# Patient Record
Sex: Male | Born: 1950
Health system: Southern US, Community
[De-identification: ages and names within clinical notes are randomized; demographics above are authoritative.]

## PROBLEM LIST (undated history)

## (undated) DIAGNOSIS — I214 Non-ST elevation (NSTEMI) myocardial infarction: Secondary | ICD-10-CM

## (undated) DIAGNOSIS — M7712 Lateral epicondylitis, left elbow: Secondary | ICD-10-CM

## (undated) DIAGNOSIS — I2510 Atherosclerotic heart disease of native coronary artery without angina pectoris: Secondary | ICD-10-CM

## (undated) DIAGNOSIS — F419 Anxiety disorder, unspecified: Principal | ICD-10-CM

## (undated) DIAGNOSIS — E119 Type 2 diabetes mellitus without complications: Secondary | ICD-10-CM

## (undated) DIAGNOSIS — K219 Gastro-esophageal reflux disease without esophagitis: Secondary | ICD-10-CM

## (undated) DIAGNOSIS — Z720 Tobacco use: Secondary | ICD-10-CM

## (undated) DIAGNOSIS — E785 Hyperlipidemia, unspecified: Secondary | ICD-10-CM

## (undated) DIAGNOSIS — D696 Thrombocytopenia, unspecified: Secondary | ICD-10-CM

## (undated) DIAGNOSIS — G4700 Insomnia, unspecified: Secondary | ICD-10-CM

## (undated) DIAGNOSIS — I739 Peripheral vascular disease, unspecified: Secondary | ICD-10-CM

## (undated) DIAGNOSIS — N390 Urinary tract infection, site not specified: Secondary | ICD-10-CM

## (undated) DIAGNOSIS — M659 Synovitis and tenosynovitis, unspecified: Secondary | ICD-10-CM

## (undated) DIAGNOSIS — S3992XA Unspecified injury of lower back, initial encounter: Secondary | ICD-10-CM

## (undated) DIAGNOSIS — C9110 Chronic lymphocytic leukemia of B-cell type not having achieved remission: Principal | ICD-10-CM

## (undated) DIAGNOSIS — J449 Chronic obstructive pulmonary disease, unspecified: Secondary | ICD-10-CM

## (undated) DIAGNOSIS — M65939 Unspecified synovitis and tenosynovitis, unspecified forearm: Secondary | ICD-10-CM

## (undated) DIAGNOSIS — Z79899 Other long term (current) drug therapy: Secondary | ICD-10-CM

## (undated) DIAGNOSIS — G709 Myoneural disorder, unspecified: Secondary | ICD-10-CM

## (undated) HISTORY — DX: Myoneural disorder, unspecified: G70.9

## (undated) HISTORY — DX: Chronic obstructive pulmonary disease, unspecified: J44.9

## (undated) HISTORY — DX: Chronic lymphocytic leukemia of B-cell type not having achieved remission: C91.10

## (undated) HISTORY — PX: CARDIAC CATHETERIZATION: SHX172

## (undated) HISTORY — DX: Non-ST elevation (NSTEMI) myocardial infarction: I21.4

## (undated) HISTORY — DX: Unspecified injury of lower back, initial encounter: S39.92XA

## (undated) HISTORY — PX: OTHER SURGICAL HISTORY: SHX169

## (undated) HISTORY — DX: Type 2 diabetes mellitus without complications: E11.9

## (undated) HISTORY — DX: Atherosclerotic heart disease of native coronary artery without angina pectoris: I25.10

## (undated) HISTORY — DX: Tobacco use: Z72.0

## (undated) HISTORY — DX: Anxiety disorder, unspecified: F41.9

## (undated) HISTORY — DX: Peripheral vascular disease, unspecified: I73.9

## (undated) HISTORY — DX: Hyperlipidemia, unspecified: E78.5

---
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
## 1989-01-14 HISTORY — PX: OTHER SURGICAL HISTORY: SHX169

## 1989-04-14 HISTORY — PX: OTHER SURGICAL HISTORY: SHX169

## 1997-07-24 ENCOUNTER — Ambulatory Visit (HOSPITAL_COMMUNITY): Admission: RE | Admit: 1997-07-24 | Discharge: 1997-07-24 | Payer: Self-pay | Admitting: Cardiology

## 2003-05-15 ENCOUNTER — Inpatient Hospital Stay (HOSPITAL_COMMUNITY): Admission: EM | Admit: 2003-05-15 | Discharge: 2003-05-16 | Payer: Self-pay | Admitting: Emergency Medicine

## 2005-01-24 ENCOUNTER — Encounter (INDEPENDENT_AMBULATORY_CARE_PROVIDER_SITE_OTHER): Payer: Self-pay | Admitting: *Deleted

## 2005-01-24 ENCOUNTER — Ambulatory Visit: Admission: RE | Admit: 2005-01-24 | Discharge: 2005-01-24 | Payer: Self-pay | Admitting: General Surgery

## 2005-02-14 HISTORY — PX: CHOLECYSTECTOMY: SHX55

## 2005-05-10 ENCOUNTER — Ambulatory Visit (HOSPITAL_COMMUNITY): Admission: RE | Admit: 2005-05-10 | Discharge: 2005-05-10 | Payer: Self-pay | Admitting: Gastroenterology

## 2006-06-05 ENCOUNTER — Ambulatory Visit (HOSPITAL_COMMUNITY): Admission: RE | Admit: 2006-06-05 | Discharge: 2006-06-05 | Payer: Self-pay | Admitting: Cardiology

## 2006-06-05 ENCOUNTER — Encounter: Payer: Self-pay | Admitting: Vascular Surgery

## 2006-06-05 ENCOUNTER — Ambulatory Visit: Payer: Self-pay | Admitting: Vascular Surgery

## 2006-07-13 ENCOUNTER — Ambulatory Visit: Payer: Self-pay | Admitting: *Deleted

## 2006-08-16 ENCOUNTER — Inpatient Hospital Stay (HOSPITAL_COMMUNITY): Admission: RE | Admit: 2006-08-16 | Discharge: 2006-08-18 | Payer: Self-pay | Admitting: *Deleted

## 2006-08-16 ENCOUNTER — Ambulatory Visit: Payer: Self-pay | Admitting: *Deleted

## 2006-08-16 ENCOUNTER — Encounter (INDEPENDENT_AMBULATORY_CARE_PROVIDER_SITE_OTHER): Payer: Self-pay | Admitting: *Deleted

## 2006-08-17 ENCOUNTER — Encounter (INDEPENDENT_AMBULATORY_CARE_PROVIDER_SITE_OTHER): Payer: Self-pay | Admitting: *Deleted

## 2006-09-07 ENCOUNTER — Ambulatory Visit: Payer: Self-pay | Admitting: *Deleted

## 2006-09-28 ENCOUNTER — Ambulatory Visit: Payer: Self-pay | Admitting: *Deleted

## 2007-08-15 HISTORY — PX: TARSAL TUNNEL RELEASE: SUR1099

## 2007-09-13 ENCOUNTER — Ambulatory Visit: Payer: Self-pay | Admitting: *Deleted

## 2008-07-24 ENCOUNTER — Ambulatory Visit: Payer: Self-pay | Admitting: Surgery

## 2008-08-06 ENCOUNTER — Ambulatory Visit (HOSPITAL_COMMUNITY): Admission: RE | Admit: 2008-08-06 | Discharge: 2008-08-06 | Payer: Self-pay | Admitting: *Deleted

## 2008-08-06 ENCOUNTER — Ambulatory Visit: Payer: Self-pay | Admitting: *Deleted

## 2008-09-04 ENCOUNTER — Ambulatory Visit: Payer: Self-pay | Admitting: *Deleted

## 2008-09-27 ENCOUNTER — Ambulatory Visit: Payer: Self-pay | Admitting: Occupational Medicine

## 2008-09-27 DIAGNOSIS — K219 Gastro-esophageal reflux disease without esophagitis: Secondary | ICD-10-CM

## 2008-09-27 DIAGNOSIS — E785 Hyperlipidemia, unspecified: Secondary | ICD-10-CM

## 2008-09-27 DIAGNOSIS — I739 Peripheral vascular disease, unspecified: Secondary | ICD-10-CM

## 2008-09-27 DIAGNOSIS — IMO0002 Reserved for concepts with insufficient information to code with codable children: Secondary | ICD-10-CM | POA: Insufficient documentation

## 2009-02-11 ENCOUNTER — Ambulatory Visit: Payer: Self-pay | Admitting: Vascular Surgery

## 2009-06-14 HISTORY — PX: CORONARY STENT PLACEMENT: SHX1402

## 2009-06-28 ENCOUNTER — Inpatient Hospital Stay (HOSPITAL_COMMUNITY): Admission: EM | Admit: 2009-06-28 | Discharge: 2009-06-29 | Payer: Self-pay | Admitting: Cardiology

## 2009-06-28 ENCOUNTER — Encounter: Payer: Self-pay | Admitting: Emergency Medicine

## 2009-06-28 ENCOUNTER — Ambulatory Visit: Payer: Self-pay | Admitting: Internal Medicine

## 2009-06-28 ENCOUNTER — Ambulatory Visit: Payer: Self-pay | Admitting: Diagnostic Radiology

## 2009-06-28 DIAGNOSIS — I214 Non-ST elevation (NSTEMI) myocardial infarction: Secondary | ICD-10-CM

## 2009-06-28 HISTORY — DX: Non-ST elevation (NSTEMI) myocardial infarction: I21.4

## 2009-08-31 ENCOUNTER — Ambulatory Visit: Payer: Self-pay | Admitting: Surgery

## 2010-04-05 ENCOUNTER — Other Ambulatory Visit: Payer: Self-pay | Admitting: Oncology

## 2010-04-05 ENCOUNTER — Encounter (HOSPITAL_BASED_OUTPATIENT_CLINIC_OR_DEPARTMENT_OTHER): Payer: BC Managed Care – PPO | Admitting: Oncology

## 2010-04-05 ENCOUNTER — Other Ambulatory Visit (HOSPITAL_COMMUNITY)
Admission: RE | Admit: 2010-04-05 | Discharge: 2010-04-05 | Disposition: A | Discharge status: Home / Self Care | Payer: BC Managed Care – PPO | Source: Ambulatory Visit | Attending: Oncology | Admitting: Oncology

## 2010-04-05 DIAGNOSIS — D47Z9 Other specified neoplasms of uncertain behavior of lymphoid, hematopoietic and related tissue: Secondary | ICD-10-CM

## 2010-04-05 DIAGNOSIS — D72820 Lymphocytosis (symptomatic): Secondary | ICD-10-CM | POA: Insufficient documentation

## 2010-04-05 LAB — COMPREHENSIVE METABOLIC PANEL WITH GFR
ALT: 28 U/L (ref 0–53)
AST: 29 U/L (ref 0–37)
Albumin: 3.9 g/dL (ref 3.5–5.2)
Alkaline Phosphatase: 96 U/L (ref 39–117)
BUN: 12 mg/dL (ref 6–23)
CO2: 23 meq/L (ref 19–32)
Calcium: 9.1 mg/dL (ref 8.4–10.5)
Chloride: 105 meq/L (ref 96–112)
Creatinine, Ser: 0.9 mg/dL (ref 0.40–1.50)
Glucose, Bld: 309 mg/dL — ABNORMAL HIGH (ref 70–99)
Potassium: 4 meq/L (ref 3.5–5.3)
Sodium: 138 meq/L (ref 135–145)
Total Bilirubin: 0.5 mg/dL (ref 0.3–1.2)
Total Protein: 6.1 g/dL (ref 6.0–8.3)

## 2010-04-05 LAB — CBC WITH DIFFERENTIAL/PLATELET
BASO%: 0.4 % (ref 0.0–2.0)
Basophils Absolute: 0.1 10*3/uL (ref 0.0–0.1)
EOS%: 0.2 % (ref 0.0–7.0)
Eosinophils Absolute: 0.1 10*3/uL (ref 0.0–0.5)
HCT: 46.5 % (ref 38.4–49.9)
HGB: 16.2 g/dL (ref 13.0–17.1)
LYMPH%: 72.9 % — ABNORMAL HIGH (ref 14.0–49.0)
MCH: 31.5 pg (ref 27.2–33.4)
MCHC: 34.9 g/dL (ref 32.0–36.0)
MONO#: 0.6 10*3/uL (ref 0.1–0.9)
NEUT#: 7.4 10*3/uL — ABNORMAL HIGH (ref 1.5–6.5)
NEUT%: 24.6 % — ABNORMAL LOW (ref 39.0–75.0)
RBC: 5.16 10*6/uL (ref 4.20–5.82)
RDW: 13.9 % (ref 11.0–14.6)
WBC: 30.2 10*3/uL — ABNORMAL HIGH (ref 4.0–10.3)
lymph#: 22 10*3/uL — ABNORMAL HIGH (ref 0.9–3.3)

## 2010-04-05 LAB — MORPHOLOGY
PLT EST: ADEQUATE
RBC Comments: NORMAL

## 2010-04-05 LAB — LACTATE DEHYDROGENASE: LDH: 187 U/L (ref 94–250)

## 2010-04-06 LAB — IGG, IGA, IGM
IgA: 143 mg/dL (ref 68–378)
IgG (Immunoglobin G), Serum: 515 mg/dL — ABNORMAL LOW (ref 694–1618)
IgM, Serum: 28 mg/dL — ABNORMAL LOW (ref 60–263)

## 2010-04-06 LAB — DIRECT ANTIGLOBULIN TEST (NOT AT ARMC)
DAT (Complement): NEGATIVE
DAT IgG: NEGATIVE

## 2010-04-06 LAB — HAPTOGLOBIN: Haptoglobin: 228 mg/dL — ABNORMAL HIGH (ref 16–200)

## 2010-04-07 LAB — LEUKOCYTE ALKALINE PHOSPHATASE: Leukocyte Alkaline Phos Stain: 168 — ABNORMAL HIGH (ref 30–140)

## 2010-04-07 LAB — JAK-2 V617F

## 2010-04-07 LAB — FLOW CYTOMETRY

## 2010-04-08 LAB — COMPREHENSIVE METABOLIC PANEL
ALT: 28 U/L (ref 0–53)
AST: 29 U/L (ref 0–37)
Alkaline Phosphatase: 96 U/L (ref 39–117)
BUN: 12 mg/dL (ref 6–23)
CO2: 23 mEq/L (ref 19–32)
Calcium: 9.1 mg/dL (ref 8.4–10.5)
Chloride: 105 mEq/L (ref 96–112)
Creatinine, Ser: 0.9 mg/dL (ref 0.40–1.50)
Glucose, Bld: 309 mg/dL — ABNORMAL HIGH (ref 70–99)
Sodium: 138 mEq/L (ref 135–145)
Total Bilirubin: 0.5 mg/dL (ref 0.3–1.2)
Total Protein: 6.1 g/dL (ref 6.0–8.3)

## 2010-04-08 LAB — LACTATE DEHYDROGENASE: LDH: 187 U/L (ref 94–250)

## 2010-04-12 ENCOUNTER — Encounter: Payer: Self-pay | Admitting: Oncology

## 2010-04-20 ENCOUNTER — Other Ambulatory Visit: Payer: Self-pay | Admitting: Oncology

## 2010-04-20 ENCOUNTER — Encounter (HOSPITAL_BASED_OUTPATIENT_CLINIC_OR_DEPARTMENT_OTHER): Payer: BC Managed Care – PPO | Admitting: Oncology

## 2010-04-20 DIAGNOSIS — D47Z9 Other specified neoplasms of uncertain behavior of lymphoid, hematopoietic and related tissue: Secondary | ICD-10-CM

## 2010-04-20 LAB — CBC WITH DIFFERENTIAL/PLATELET
BASO%: 0.9 % (ref 0.0–2.0)
Basophils Absolute: 0.3 10*3/uL — ABNORMAL HIGH (ref 0.0–0.1)
EOS%: 0.2 % (ref 0.0–7.0)
Eosinophils Absolute: 0.1 10*3/uL (ref 0.0–0.5)
HCT: 45.6 % (ref 38.4–49.9)
HGB: 15.7 g/dL (ref 13.0–17.1)
LYMPH%: 72.5 % — ABNORMAL HIGH (ref 14.0–49.0)
MCHC: 34.3 g/dL (ref 32.0–36.0)
MCV: 90.9 fL (ref 79.3–98.0)
MONO#: 0.8 10*3/uL (ref 0.1–0.9)
MONO%: 2.9 % (ref 0.0–14.0)
NEUT#: 6.8 10*3/uL — ABNORMAL HIGH (ref 1.5–6.5)
NEUT%: 23.5 % — ABNORMAL LOW (ref 39.0–75.0)
Platelets: 148 10*3/uL (ref 140–400)
RBC: 5.02 10*6/uL (ref 4.20–5.82)
RDW: 13.9 % (ref 11.0–14.6)
WBC: 29 10*3/uL — ABNORMAL HIGH (ref 4.0–10.3)
lymph#: 21.1 10*3/uL — ABNORMAL HIGH (ref 0.9–3.3)

## 2010-04-21 ENCOUNTER — Other Ambulatory Visit: Payer: Self-pay | Admitting: Oncology

## 2010-04-21 ENCOUNTER — Encounter (HOSPITAL_BASED_OUTPATIENT_CLINIC_OR_DEPARTMENT_OTHER): Payer: BC Managed Care – PPO | Admitting: Oncology

## 2010-04-21 DIAGNOSIS — D47Z9 Other specified neoplasms of uncertain behavior of lymphoid, hematopoietic and related tissue: Secondary | ICD-10-CM

## 2010-04-21 DIAGNOSIS — C9110 Chronic lymphocytic leukemia of B-cell type not having achieved remission: Secondary | ICD-10-CM

## 2010-04-21 LAB — BASIC METABOLIC PANEL
BUN: 11 mg/dL (ref 6–23)
CO2: 27 mEq/L (ref 19–32)
Calcium: 9.8 mg/dL (ref 8.4–10.5)
Chloride: 101 mEq/L (ref 96–112)
Creatinine, Ser: 0.9 mg/dL (ref 0.40–1.50)
Glucose, Bld: 306 mg/dL — ABNORMAL HIGH (ref 70–99)
Potassium: 4.4 mEq/L (ref 3.5–5.3)

## 2010-05-03 LAB — GLUCOSE, CAPILLARY: Glucose-Capillary: 120 mg/dL — ABNORMAL HIGH (ref 70–99)

## 2010-05-03 LAB — CBC
HCT: 48.1 % (ref 39.0–52.0)
HCT: 53.3 % — ABNORMAL HIGH (ref 39.0–52.0)
Hemoglobin: 16.7 g/dL (ref 13.0–17.0)
Hemoglobin: 17.1 g/dL — ABNORMAL HIGH (ref 13.0–17.0)
Hemoglobin: 18.2 g/dL — ABNORMAL HIGH (ref 13.0–17.0)
MCHC: 34.7 g/dL (ref 30.0–36.0)
MCHC: 35.1 g/dL (ref 30.0–36.0)
MCHC: 34.1 g/dL (ref 30.0–36.0)
MCV: 96.5 fL (ref 78.0–100.0)
MCV: 97.4 fL (ref 78.0–100.0)
MCV: 96.3 fL (ref 78.0–100.0)
Platelets: 148 10*3/uL — ABNORMAL LOW (ref 150–400)
Platelets: 170 K/uL (ref 150–400)
RBC: 4.94 MIL/uL (ref 4.22–5.81)
RBC: 5.54 MIL/uL (ref 4.22–5.81)
RDW: 15 % (ref 11.5–15.5)
RDW: 15.2 % (ref 11.5–15.5)
RDW: 14.3 % (ref 11.5–15.5)
WBC: 13.2 10*3/uL — ABNORMAL HIGH (ref 4.0–10.5)
WBC: 14.3 K/uL — ABNORMAL HIGH (ref 4.0–10.5)

## 2010-05-03 LAB — DIFFERENTIAL
Basophils Absolute: 0 10*3/uL (ref 0.0–0.1)
Basophils Absolute: 0 K/uL (ref 0.0–0.1)
Basophils Relative: 0 % (ref 0–1)
Basophils Relative: 0 % (ref 0–1)
Basophils Relative: 1 % (ref 0–1)
Eosinophils Absolute: 0.1 10*3/uL (ref 0.0–0.7)
Eosinophils Absolute: 0.2 K/uL (ref 0.0–0.7)
Eosinophils Absolute: 0.1 10*3/uL (ref 0.0–0.7)
Eosinophils Relative: 1 % (ref 0–5)
Eosinophils Relative: 2 % (ref 0–5)
Eosinophils Relative: 1 % (ref 0–5)
Lymphocytes Relative: 40 % (ref 12–46)
Lymphocytes Relative: 58 % — ABNORMAL HIGH (ref 12–46)
Lymphs Abs: 5.5 K/uL — ABNORMAL HIGH (ref 0.7–4.0)
Lymphs Abs: 7.6 10*3/uL — ABNORMAL HIGH (ref 0.7–4.0)
Lymphs Abs: 8.3 10*3/uL — ABNORMAL HIGH (ref 0.7–4.0)
Monocytes Absolute: 0.4 K/uL (ref 0.1–1.0)
Monocytes Absolute: 0.7 10*3/uL (ref 0.1–1.0)
Monocytes Absolute: 0.4 10*3/uL (ref 0.1–1.0)
Monocytes Relative: 3 % (ref 3–12)
Monocytes Relative: 5 % (ref 3–12)
Neutro Abs: 4.8 10*3/uL (ref 1.7–7.7)
Neutro Abs: 7.5 K/uL (ref 1.7–7.7)
Neutrophils Relative %: 36 % — ABNORMAL LOW (ref 43–77)
Neutrophils Relative %: 55 % (ref 43–77)
Neutrophils Relative %: 38 % — ABNORMAL LOW (ref 43–77)

## 2010-05-03 LAB — APTT: aPTT: 30 seconds (ref 24–37)

## 2010-05-03 LAB — COMPREHENSIVE METABOLIC PANEL
ALT: 23 U/L (ref 0–53)
ALT: 24 U/L (ref 0–53)
AST: 24 U/L (ref 0–37)
AST: 21 U/L (ref 0–37)
Albumin: 3.2 g/dL — ABNORMAL LOW (ref 3.5–5.2)
Alkaline Phosphatase: 70 U/L (ref 39–117)
Alkaline Phosphatase: 112 U/L (ref 39–117)
BUN: 7 mg/dL (ref 6–23)
CO2: 23 mEq/L (ref 19–32)
CO2: 21 mEq/L (ref 19–32)
Calcium: 8.4 mg/dL (ref 8.4–10.5)
Calcium: 9.4 mg/dL (ref 8.4–10.5)
Chloride: 108 mEq/L (ref 96–112)
Creatinine, Ser: 0.85 mg/dL (ref 0.4–1.5)
GFR calc Af Amer: >60 mL/min (ref >60)
GFR calc Af Amer: >60 mL/min (ref >60)
GFR calc non Af Amer: >60 mL/min (ref >60)
GFR calc non Af Amer: >60 mL/min (ref >60)
Glucose, Bld: 123 mg/dL — ABNORMAL HIGH (ref 70–99)
Glucose, Bld: 228 mg/dL — ABNORMAL HIGH (ref 70–99)
Potassium: 4.2 mEq/L (ref 3.5–5.1)
Potassium: 4 mEq/L (ref 3.5–5.1)
Sodium: 138 mEq/L (ref 135–145)
Sodium: 145 mEq/L (ref 135–145)
Total Bilirubin: 0.6 mg/dL (ref 0.3–1.2)
Total Protein: 5.6 g/dL — ABNORMAL LOW (ref 6.0–8.3)
Total Protein: 7.2 g/dL (ref 6.0–8.3)

## 2010-05-03 LAB — CARDIAC PANEL(CRET KIN+CKTOT+MB+TROPI)
CK, MB: 2.5 ng/mL (ref 0.3–4.0)
CK, MB: 6.9 ng/mL (ref 0.3–4.0)
Relative Index: INVALID (ref 0.0–2.5)
Relative Index: INVALID (ref 0.0–2.5)
Total CK: 48 U/L (ref 7–232)
Total CK: 71 U/L (ref 7–232)
Total CK: 94 U/L (ref 7–232)
Troponin I: 0.7 ng/mL (ref 0.00–0.06)

## 2010-05-03 LAB — BASIC METABOLIC PANEL WITH GFR
BUN: 8 mg/dL (ref 6–23)
CO2: 23 meq/L (ref 19–32)
Calcium: 8.5 mg/dL (ref 8.4–10.5)
Chloride: 110 meq/L (ref 96–112)
Creatinine, Ser: 0.84 mg/dL (ref 0.4–1.5)
GFR calc non Af Amer: >60 mL/min
Glucose, Bld: 113 mg/dL — ABNORMAL HIGH (ref 70–99)
Potassium: 4.2 meq/L (ref 3.5–5.1)
Sodium: 140 meq/L (ref 135–145)

## 2010-05-03 LAB — PROTIME-INR
INR: 1.02 (ref 0.00–1.49)
INR: 0.92 (ref 0.00–1.49)
Prothrombin Time: 13.3 s (ref 11.6–15.2)
Prothrombin Time: 12.3 s (ref 11.6–15.2)

## 2010-05-03 LAB — POCT CARDIAC MARKERS
CKMB, poc: 1.2 ng/mL (ref 1.0–8.0)
Troponin i, poc: <0.05 ng/mL (ref 0.00–0.09)

## 2010-05-03 LAB — LIPASE, BLOOD: Lipase: 107 U/L (ref 23–300)

## 2010-05-03 LAB — LIPID PANEL
HDL: 32 mg/dL — ABNORMAL LOW
Total CHOL/HDL Ratio: 6.3 ratio
Triglycerides: 217 mg/dL — ABNORMAL HIGH
VLDL: 43 mg/dL — ABNORMAL HIGH (ref 0–40)

## 2010-05-03 LAB — PATHOLOGIST SMEAR REVIEW

## 2010-05-03 LAB — HEPARIN LEVEL (UNFRACTIONATED)

## 2010-05-03 LAB — BRAIN NATRIURETIC PEPTIDE: Pro B Natriuretic peptide (BNP): 59 pg/mL (ref 0.0–100.0)

## 2010-05-03 LAB — MAGNESIUM: Magnesium: 2 mg/dL (ref 1.5–2.5)

## 2010-05-03 LAB — HEMOGLOBIN A1C
Hgb A1c MFr Bld: 6.3 % — ABNORMAL HIGH (ref <5.7)
Mean Plasma Glucose: 134 mg/dL — ABNORMAL HIGH (ref <117)

## 2010-05-03 LAB — MRSA PCR SCREENING: MRSA by PCR: NEGATIVE

## 2010-05-06 ENCOUNTER — Encounter (HOSPITAL_COMMUNITY): Payer: Self-pay

## 2010-05-06 ENCOUNTER — Ambulatory Visit (HOSPITAL_COMMUNITY)
Admission: RE | Admit: 2010-05-06 | Discharge: 2010-05-06 | Disposition: A | Discharge status: Home / Self Care | Payer: BC Managed Care – PPO | Source: Ambulatory Visit | Attending: Oncology | Admitting: Oncology

## 2010-05-06 DIAGNOSIS — J984 Other disorders of lung: Secondary | ICD-10-CM | POA: Insufficient documentation

## 2010-05-06 DIAGNOSIS — C9110 Chronic lymphocytic leukemia of B-cell type not having achieved remission: Secondary | ICD-10-CM | POA: Insufficient documentation

## 2010-05-06 DIAGNOSIS — Z9089 Acquired absence of other organs: Secondary | ICD-10-CM | POA: Insufficient documentation

## 2010-05-06 MED ORDER — IOHEXOL 300 MG/ML  SOLN
125.0000 mL | Freq: Once | INTRAMUSCULAR | Status: AC | PRN
  Administered 2010-05-06: 125 mL via INTRAVENOUS

## 2010-05-24 LAB — POCT I-STAT, CHEM 8
Calcium, Ion: 1.13 mmol/L (ref 1.12–1.32)
HCT: 52 % (ref 39.0–52.0)
Hemoglobin: 17.7 g/dL — ABNORMAL HIGH (ref 13.0–17.0)
Sodium: 139 mEq/L (ref 135–145)
TCO2: 24 mmol/L (ref 0–100)

## 2010-06-14 ENCOUNTER — Encounter: Payer: Self-pay | Admitting: Cardiology

## 2010-06-14 DIAGNOSIS — Z720 Tobacco use: Secondary | ICD-10-CM | POA: Insufficient documentation

## 2010-06-14 DIAGNOSIS — I739 Peripheral vascular disease, unspecified: Secondary | ICD-10-CM | POA: Insufficient documentation

## 2010-06-14 DIAGNOSIS — E785 Hyperlipidemia, unspecified: Secondary | ICD-10-CM | POA: Insufficient documentation

## 2010-06-14 DIAGNOSIS — I214 Non-ST elevation (NSTEMI) myocardial infarction: Secondary | ICD-10-CM | POA: Insufficient documentation

## 2010-06-21 ENCOUNTER — Encounter: Payer: Self-pay | Admitting: Cardiology

## 2010-06-21 ENCOUNTER — Ambulatory Visit (INDEPENDENT_AMBULATORY_CARE_PROVIDER_SITE_OTHER): Payer: BC Managed Care – PPO | Admitting: Cardiology

## 2010-06-21 VITALS — BP 142/90 | HR 74 | Ht 72.0 in | Wt 234.0 lb

## 2010-06-21 DIAGNOSIS — I2510 Atherosclerotic heart disease of native coronary artery without angina pectoris: Secondary | ICD-10-CM

## 2010-06-21 DIAGNOSIS — I219 Acute myocardial infarction, unspecified: Secondary | ICD-10-CM

## 2010-06-21 DIAGNOSIS — Z720 Tobacco use: Secondary | ICD-10-CM

## 2010-06-21 DIAGNOSIS — I214 Non-ST elevation (NSTEMI) myocardial infarction: Secondary | ICD-10-CM

## 2010-06-21 DIAGNOSIS — E785 Hyperlipidemia, unspecified: Secondary | ICD-10-CM

## 2010-06-21 MED ORDER — METOPROLOL SUCCINATE ER 50 MG PO TB24: 50.0000 mg | ORAL_TABLET | Freq: Every day | ORAL | Status: DC

## 2010-06-21 MED ORDER — PRASUGREL HCL 10 MG PO TABS: 10.0000 mg | ORAL_TABLET | Freq: Every day | ORAL | Status: DC

## 2010-06-21 MED ORDER — ROSUVASTATIN CALCIUM 10 MG PO TABS: 10.0000 mg | ORAL_TABLET | Freq: Every day | ORAL | Status: DC

## 2010-06-21 MED ORDER — NITROGLYCERIN 0.4 MG SL SUBL: 0.4000 mg | SUBLINGUAL_TABLET | SUBLINGUAL | Status: DC | PRN

## 2010-06-21 NOTE — Assessment & Plan Note (Signed)
He's currently smoking a fourth pack cigarettes a day. I encouraged him to stop.

## 2010-06-21 NOTE — Assessment & Plan Note (Signed)
We'll continue his duall antiplatelet therapy with his long stent and ongoing cigarette abuse.

## 2010-06-21 NOTE — Progress Notes (Signed)
Subjective:   Ryan Copeland comes in today for followup visit. In general, he's been doing well he he had a 3.0 x 32 mm stent in his left anterior descending in may 2011. He continues to smoke and I would like to continue him on chronic Effient as long as he continues to smoke. Overall, he's been doing well but still has some problems with the neuropathy. 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 He's had mild increases in blood pressure chronically. Overall, he's tolerating his medications well. He light to stop smoking and given my approval to use a Nicoderm patch.  Current Outpatient Prescriptions  Medication Sig Dispense Refill  . aspirin 325 MG tablet Take 325 mg by mouth daily.        Marland Kitchen esomeprazole (NEXIUM) 40 MG capsule Take 40 mg by mouth daily before breakfast.        . nitroGLYCERIN (NITROSTAT) 0.4 MG SL tablet Place 1 tablet (0.4 mg total) under the tongue every 5 (five) minutes as needed.  25 tablet  12  . prasugrel (EFFIENT) 10 MG TABS Take 1 tablet (10 mg total) by mouth daily.  30 tablet  11  . Pregabalin (LYRICA PO) Take 75 mg by mouth. Take 1 tablet three daily,and take 2 tablets at bedtime.      . rosuvastatin (CRESTOR) 10 MG tablet Take 1 tablet (10 mg total) by mouth daily.  30 tablet  11  . DISCONTD: metoprolol succinate (TOPROL-XL) 25 MG 24 hr tablet Take 25 mg by mouth daily.        Marland Kitchen DISCONTD: nitroGLYCERIN (NITROSTAT) 0.4 MG SL tablet Place 0.4 mg under the tongue every 5 (five) minutes as needed.        Marland Kitchen DISCONTD: prasugrel (EFFIENT) 10 MG TABS Take 10 mg by mouth daily.        Marland Kitchen DISCONTD: rosuvastatin (CRESTOR) 10 MG tablet Take 10 mg by mouth daily.        . metoprolol (TOPROL XL) 50 MG 24 hr tablet Take 1 tablet (50 mg total) by mouth daily.  30 tablet  11  . Varenicline Tartrate (CHANTIX PO) Take by mouth daily. AS DIRECTED         Allergies  Allergen Reactions  . Codeine     Patient Active Problem List  Diagnoses  . HYPERLIPIDEMIA  . PERIPHERAL VASCULAR DISEASE    . GERD  . SHOULDER STRAIN, RIGHT  . MI, acute, non ST segment elevation  . Hyperlipidemia  . Tobacco abuse  . PVD (peripheral vascular disease)    History  Smoking status  . Current Everyday Smoker -- 0.2 packs/day for 43 years  . Types: Cigarettes  Smokeless tobacco  . Not on file    History  Alcohol Use No    Family History  Problem Relation Age of Onset  . Lung cancer Mother 22  . Heart failure Father 74    Review of Systems:   The patient denies any heat or cold intolerance.  No weight gain or weight loss.  The patient denies headaches or blurry vision.  There is no cough or sputum production.  The patient denies dizziness.  There is no hematuria or hematochezia.  The patient denies any muscle aches or arthritis.  The patient denies any rash.  The patient denies frequent falling or instability.  There is no history of depression or anxiety.  All other systems were reviewed and are negative.   Physical Exam:   Weight is 234. Blood pressure  142/90 sitting, heart rate 74.  The head is normocephalic and atraumatic.  Pupils are equally round and reactive to light.  Sclerae nonicteric.  Conjunctiva is clear.  Oropharynx is unremarkable.  There's adequate oral airway.  Neck is supple there are no masses.  Thyroid is not enlarged.  There is no lymphadenopathy.  Lungs are clear.  Chest is symmetric.  Heart shows a regular rate and rhythm.  S1 and S2 are normal.  There is no murmur click or gallop.  Abdomen is soft normal bowel sounds.  There is no organomegaly.  Genital and rectal deferred.  Extremities are without edema.  Peripheral pulses are adequate.  Neurologically intact.  Full range of motion.  The patient is not depressed.  Skin is warm and dry.  Assessment / Plan:

## 2010-06-21 NOTE — Assessment & Plan Note (Signed)
We'll continue Crestor 10 mg a day. Have him see Lawson Fiscal in 6 months with followup with Dr. Earleen Newport her thereafter.

## 2010-06-29 NOTE — Op Note (Signed)
NAME:  CHADWIN, FURY NO.:  [id number]   MEDICAL RECORD NO.:  [id number]          PATIENT TYPE:  INP   LOCATION:  2040                         FACILITY:  [facility]   PHYSICIAN:  [name], M.D.    DATE OF BIRTH:  16-Jul-1950   DATE OF PROCEDURE:  08/16/2006  DATE OF DISCHARGE:  08/18/2006                               OPERATIVE REPORT   PHYSICIAN:  Denman George, MD.   DIAGNOSIS:  Left lower extremity claudication.   PROCEDURE:  1. Abdominal aortogram with bilateral lower extremity runoff      arteriography.  2. Left common iliac artery PTA/stent.   ACCESS:  Bilateral common femoral artery 7-French sheath.   CONTRAST:  275 mL Visipaque.   FLUOROSCOPY TIME:  53 minutes.   COMPLICATIONS:  Atheroemboli left popliteal tibial arteries.   CLINICAL NOTE:  Ryan Copeland is a 60 year old gentleman with history of  heavy tobacco use and significant limiting left lower extremity  claudication.  Workup revealed evidence of left iliac occlusive disease.  Brought to the cath lab at this time for diagnostic workup and possible  intervention.   PROCEDURE NOTE:  The patient brought to cath lab in stable condition.  Placed in supine position.  Both groins prepped and draped in sterile  fashion.  Skin and subcutaneous tissue in right groin instilled with 1%  Xylocaine.  A needle easily introduced right common femoral artery.  0.035 Wholey guidewire advanced through the needle into the mid  abdominal aorta.  Site opened with an 11 blade.  A 5-French sheath  advanced over the guidewire to the right common femoral artery.  The  sheath flushed with heparin saline solution.   A standard pigtail catheter was then advanced over the guidewire to the  mid abdominal aorta.  Standard AP mid abdominal aortogram obtained.  This revealed single renal arteries bilaterally.  Left renal artery was  widely patent.  Right renal artery revealed moderate proximal stenosis  estimated be  approximately 50%.   Pigtail catheter brought down to the aortic bifurcation and lower  extremity runoff arteriography obtained.  This revealed widely patent  right common iliac artery.  The right hypogastric and external iliac  arteries were widely patent.  The right common femoral artery profunda  and superficial femoral arteries were intact without significant  stenosis.  Right popliteal artery was normal.  There was three-vessel  tibial artery runoff in the lateral right lower extremity.   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  There was reconstitution of the left hypogastric and external  iliac arteries at their origin.  The left external iliac artery  reconstituted via collaterals arising from the left hypogastric artery.  The left common femoral artery was widely patent.  The left profunda and  superficial femoral arteries were intact.  Left popliteal artery normal.  There was three-vessel left tibial artery runoff.   The guidewire then reinserted and a Sos catheter advanced over the  guidewire.  This was  engaged into the origin of the left common iliac  artery.  The patient administered 3000 units heparin intravenously.  Attempts were then made with a angled Glidewire to access the occluded  segment of the left common iliac artery.  Using a subintimal technique.   After several attempts with the Sos catheter, the guidewire was  reinserted.  The sheath exchanged in the right groin for a 7-French  sheath.  An IMA guide catheter was then advanced over the guidewire and  engaged into the left common iliac artery origin.  Using an angled  Glidewire and a straight end-hole catheter, subintimal dissection  technique was used to cross the left common iliac artery origin.  Once  the guidewire was advanced across the occlusion, it was then reentered  into the left external  iliac artery.   Skin and subcutaneous tissue in the left groin then instilled 1%  Xylocaine.  The left common femoral artery was accessed with a 18 gauge  needle.  0.035 Wholey guidewire advanced through the needle into the  left external iliac artery.  A long 7-French sheath was then advanced  over guidewire in the left common femoral and external iliac artery.   Using a snare technique, the guidewire which had been passed across a  total occlusion was grasped with a snare in left external iliac artery  and brought out through the left femoral sheath.  A end-hole catheter  was then passed over the guidewire and a catheter exchange made for  Wholey guidewire.   The patient was administered further doses of heparin intravenously.   This allowed full access to left common iliac total occlusion.  Predilatation of this was then carried out with a 6 x 10 Powerflex  balloon at 10 atmospheres for 35 seconds.  Following this, a arteriogram  revealed incomplete canalization of the occluded segment.  There was a  small lumen present.   The proximal most segment of the left common iliac artery was then  stented with a 9 x 28 Opti link stent at 8 atmospheres x 35 seconds.  Following this there was a significant improvement in flow through the  left common iliac artery.  However, there still was significant residual  stenosis in the distal left common iliac artery.  This was then stented  with a 12 x 60 Smart stent from the proximal left common iliac artery  down to but not covering the left hypogastric artery.  Post dilatation  was then carried out with a 10 x 60 Agiltrac balloon at 8 atmospheres x  30 seconds for two inflations.   This resulted in excellent technical result with brisk flow noted  through the left common iliac artery into patent left external and  hypogastric arteries.   Following this, a completion left lower extremity arteriogram was  performed by injecting retrograde  injection of contrast through the left  common iliac to left femoral sheath.  This revealed a left common and  profunda femoris arteries to be widely patent.  The left common femoral  artery was intact as was the left superficial femoral artery.  The left  popliteal artery was also intact.  There was however, evidence of  embolization into the tibial vessels.  The left anterior tibial artery  was occluded in its distal third and the left tibioperoneal trunk  revealed occlusion just beyond its origin with reconstitution of a small  via collaterals of a short segment of the posterior tibial artery.  There was distal reconstitution  of the posterior tibial and peroneal  arteries.   This was clear evidence of embolization into the left tibial vessels.  This complication was discussed with the patient and his wife.  The  operating room contacted and the patient immediately taken to the  operating room for left tibial embolectomy.   FINAL IMPRESSION:  1. Left lower extremity claudication secondary to the total occlusion      left common iliac artery.  2. 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  3. Evidence of left tibial artery emboli, complication noted and the      patient taken immediately to the operating room.      [name], M.D.  Electronically Signed     PGH/MEDQ  D:  12/03/2006  T:  12/04/2006  Job:  [id number]

## 2010-06-29 NOTE — Assessment & Plan Note (Signed)
OFFICE VISIT   STARSKY, Ryan Copeland  DOB:  Jul 16, 1950                                       09/04/2008  [id number]#:[id number]   The patient underwent redo left common iliac PTA on 08/06/2008 at [facility].  This was carried out for restenosis in his left common  iliac origin stent.   Since that time he has been free of claudication symptoms.  He does note  some mild discomfort in his left groin.   On evaluation he appears well.  BP 123/74, pulse 97 per minute.  His  abdomen is soft and nontender.  He has 2+ femoral pulses bilaterally.  1+ posterior tibial pulse bilaterally.   The patient has done well following his recent reintervention for  restenosis of his left iliac stent.  I have counseled him again  regarding smoking cessation.  He will return per protocol followup.   [name], M.D.  Electronically Signed   PGH/MEDQ  D:  09/04/2008  T:  09/05/2008  Job:  [id number]

## 2010-06-29 NOTE — Procedures (Signed)
BYPASS GRAFT EVALUATION   INDICATION:  Left common iliac artery stent and popliteal embolectomy.   HISTORY:  Diabetes:  No.  Cardiac:  No.  Hypertension:  Yes.  Smoking:  Yes.  Previous Surgery:  Left common iliac artery stent and left popliteal  tubal loop tubal artery embolectomy on 08/16/2006.  Other:  The patient complains of increased left hip claudication for the  last month.   SINGLE LEVEL ARTERIAL EXAM                               RIGHT              LEFT  Brachial:                    135                137  Anterior tibial:             147                94  Posterior tibial:            141                98  Peroneal:                                       86  Ankle/brachial index:        1.07               0.72   PREVIOUS ABI:  Date:  09/13/2007  RIGHT:  1.17  LEFT:  1.18   LOWER EXTREMITY BYPASS GRAFT DUPLEX EXAM:   DUPLEX:  Monophasic Doppler waveforms noted throughout the left external  iliac, common femoral, superficial femoral and popliteal arteries with  no increase in velocities.  An elevated velocity of 268 cm/second is noted in the left proximal to  mid common iliac artery.      IMPRESSION:  1. Patent left common iliac artery stent and left lower extremity      embolectomy site with an increased velocity noted as described      above.  2. Significant decrease of the left ABI noted with the right ABI      remaining stable.  3. Limited visualization of the left common and external iliac      arteries due to overlying bowel gas patterns.  The patient was not      n.p.o.       ___________________________________________  P. Liliane Bade, M.D.   CH/MEDQ  D:  07/24/2008  T:  07/24/2008  Job:  [id number]

## 2010-06-29 NOTE — Assessment & Plan Note (Signed)
OFFICE VISIT   AUDEL, COAKLEY  DOB:  06-Jun-1950                                       07/24/2008  [id number]#:[id number]   The patient underwent left common iliac PTA on 08/16/2006 with  subsequent embolectomy of his left lower extremity that day.  He had a  chronic occlusion of the left common iliac artery.  This was stented.  He presents now with recurrent claudication symptoms.  Drop in left ABI  to 0.72 and evidence of restenosis.  He will be scheduled for left lower  extremity arteriogram and probable redilatation.  Instructed to continue  taking his aspirin.   [name], M.D.  Electronically Signed   PGH/MEDQ  D:  07/24/2008  T:  07/25/2008  Job:  [id number]

## 2010-06-29 NOTE — Assessment & Plan Note (Signed)
OFFICE VISIT   Ryan Copeland, Ryan Copeland  DOB:  1950/10/05                                       09/28/2006  ZHYQM#:[id number]   The patient returns today status post left popliteal tibial  thromboembolectomy carried out August 16, 2006.  This resulted from  complications of left iliac PTA.   He does complain of some residual swelling in his left leg.  No  claudication symptoms.   BP is 130/84, pulse 81 per minute and regular, respirations 18 per  minute.  The left lower extremity is well-perfused.  2+ left femoral  pulse, 1+ left dorsalis pedis pulse.  1+ ankle edema.   The patient is placed in a below knee compression stocking 20 to 30  mmHg.  He will return per protocol for monitoring of his left iliac PTA.   [name], M.D.  Electronically Signed   PGH/MEDQ  D:  09/28/2006  T:  09/29/2006  Job:  [id number]

## 2010-06-29 NOTE — Procedures (Signed)
VASCULAR LAB EXAM   INDICATION:  Follow up left common iliac artery PTA and popliteal-tibial  thrombectomy.   HISTORY:  Diabetes:  No.  Cardiac:  No.  Hypertension:  No.   EXAM:  Left lower extremity arterial duplex.   IMPRESSION:  Patent left common iliac artery, common femoral artery,  external iliac artery, popliteal and tibial arteries.   ___________________________________________  P. Liliane Bade, M.D.   MG/MEDQ  D:  09/13/2007  T:  09/13/2007  Job:  [id number]

## 2010-06-29 NOTE — Procedures (Signed)
AORTA-ILIAC DUPLEX EVALUATION   INDICATION:   HISTORY:  Diabetes:  No  Cardiac:  Stent  Hypertension:  Yes  Smoking:  Current  Previous Surgery:  Left common iliac artery and popliteal-tibial  thrombophlebectomy on 08/16/2006 and then left common iliac PTA on  08/06/2008 by Dr. Madilyn Fireman.               SINGLE LEVEL ARTERIAL EXAM                              RIGHT                  LEFT  Brachial:                  135                    130  Anterior tibial:           136                    130  Posterior tibial:          139                    137  Peroneal:  Ankle/brachial index:      1.03                   1.01  Previous ABI/date:         02/11/2009, 1.18       02/11/2009, 1.09   AORTA-ILIAC DUPLEX EXAM  Aorta - Proximal     70 cm/s  Aorta - Mid          Not visualized  Aorta - Distal       Not visualized   RIGHT                                   LEFT                    CIA-PROXIMAL          329 cm/s                    CIA-DISTAL            Not visualized                    HYPOGASTRIC                    EIA-PROXIMAL                    EIA-MID                    EIA-DISTAL   IMPRESSION:  1. Normal ankle-brachial indices with triphasic wave forms.  2. Left common iliac stent appears to be patent with increased      velocities of 329 cm/s in the proximal stent.  Stable from previous      exams.  3. Very limited due to extensive bowel gas due to patient chewing gum      and limited due to increased body surface area.   ___________________________________________  V. Charlena Cross, MD   NT/MEDQ  D:  08/31/2009  T:  08/31/2009  Job:  [id number]

## 2010-06-29 NOTE — Op Note (Signed)
NAME:  Ryan Copeland, COMMON NO.:  [id number]   MEDICAL RECORD NO.:  [id number]          PATIENT TYPE:  AMB   LOCATION:  SDS                          FACILITY:  [facility]   PHYSICIAN:  Ryan Copeland, M.D.    DATE OF BIRTH:  1950/04/01   DATE OF PROCEDURE:  08/06/2008  DATE OF DISCHARGE:  08/06/2008                               OPERATIVE REPORT   PHYSICIAN:  Ryan Quails, MD   DIAGNOSIS:  Left lower extremity claudication.   PROCEDURE:  1. Abdominal aortogram with bilateral lower extremity runoff      arteriography.  2. Left common iliac percutaneous transluminal angiography.   ACCESS:  Bilateral common femoral artery sheath, 6-French right, 7-  French left.   CONTRAST:  170 mL Visipaque.   COMPLICATIONS:  None apparent.   CLINICAL NOTE:  Ryan Copeland is a 60 year old gentleman who previously  has had recanalization of a total occlusion of the right common iliac  artery with PTA and stenting.  Follow up at this time reveals restenosis  at the site of the previous stent placement.  He is scheduled at this  time for arteriography and possible re-intervention.   PROCEDURE NOTE:  The patient was brought to cath lab in stable  condition.  Placed in supine position.  Both legs were prepped and  draped in sterile fashion.   Skin and subcutaneous tissue of left groin were instilled with 1%  Xylocaine.  The patient received a total of 100 mcg of fentanyl IV and 1  mg of Versed IV during the procedure.   An 18-gauge needle was introduced into the left common femoral artery.  A 0.035 J-wire passed through the needle into the mid abdominal aorta.  A 6-French sheath advanced over guidewire.  Pigtail catheter advanced  over guidewire in mid abdominal aorta.   Standard AP mid abdominal aortogram obtained.  Bilateral renal arteries  were patent.  Infrarenal aorta was patent.  There was a stent at the  origin of left common iliac artery.  Evidence of restenosis in the  ostial  left common iliac site.  Right common iliac artery appeared  widely patent.   The pigtail catheter brought down to the aortic bifurcation and oblique  bilateral pelvic arteriograms obtained.  This verified a restenosis at  the ostium of the left common iliac artery within the stent.   Lower extremity runoff arteriography obtained.  Common femoral,  profunda, and superficial femoral arteries were patent bilaterally.  Patent popliteal artery bilaterally.  Right leg revealed intact 3-vessel  tibial artery runoff.  Left leg revealed patent anterior tibial and  peroneal arteries with occluded left posterior tibial artery.   The right common femoral artery was then accessed percutaneously with an  18-gauge needle after instillation of 1% Xylocaine.  A 0.035 J-wire  passed through the needle into the mid abdominal aorta.  A 6-French  sheath advanced over guidewire, dilator flushed with heparin and saline.  Dilator removed, sheath flushed with heparin saline solution.  Contralateral access was obtained to assure that maintained protection  of the right common iliac  ostium.   The patient administered 5000 units heparin intravenously.   The guidewire was accessed and left side was exchanged for an 0.014  guidewire.  A 7 x 20 cutting balloon [employer] was advanced over  guidewire and inflated.  This was inflated at 10 atmospheres for 30  seconds, deflated and turned, twisted, and reinflated at 10 atmospheres  x30 seconds.  This balloon was then removed and post dilatation was  carried out with an 8 x 20 Powerflex at 15 atmospheres x30 seconds for 2  inflations.  A post-intervention arteriogram revealed moderate residual  stenosis within the left ostium of the left common iliac artery.  Therefore, a 10 x 40 Powerflex balloon was advanced over guidewire and  inflated at 10 atmospheres x70 seconds.  At completion of this, a repeat  arteriogram obtained.  This revealed an excellent  technical result.  No  residual stenosis.  No evidence of dissection or extravasation.   Guidewires and catheters were removed bilaterally.  Sheaths will be  removed when ACT is appropriate.   The patient transferred to recovery room in stable condition.      Ryan Copeland, M.D.  Electronically Signed     PGH/MEDQ  D:  08/06/2008  T:  08/07/2008  Job:  [id number]

## 2010-06-29 NOTE — Op Note (Signed)
NAME:  GEMINI, BEAUMIER NO.:  [id number]   MEDICAL RECORD NO.:  [id number]          PATIENT TYPE:  INP   LOCATION:  2040                         FACILITY:  [facility]   PHYSICIAN:  [name], M.D.    DATE OF BIRTH:  November 20, 1950   DATE OF PROCEDURE:  08/16/2006  DATE OF DISCHARGE:  08/18/2006                               OPERATIVE REPORT   SURGEON:  Denman George, MD   ASSISTANT:  RNFA.   ANESTHETIC:  General endotracheal.   ANESTHESIOLOGIST:  Dr. Jacklynn Bue.   PREOPERATIVE DIAGNOSIS:  Emboli left lower extremity.   POSTOPERATIVE DIAGNOSIS:  Emboli left lower extremity.   PROCEDURE:  Left popliteal tubal loop tibial artery embolectomy.   CLINICAL NOTE:  Mr. Branam is a 60 year old gentleman with history of  tobacco use and peripheral vascular disease.  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  This was complicated by emboli to the left popliteal and tibial  arteries in his left lower extremity.  He is brought to the operating  room at this time for open embolectomy.   PROCEDURE NOTE:  The patient brought to the operating room in stable  hemodynamic condition.  Placed in supine position.  General endotracheal  anesthesia induced.  In the supine position, the left leg was prepped  and draped in sterile fashion.   A longitudinal skin incision made along the posterior margin of the  proximal left tibia.  Dissection carried through the subcutaneous tissue  with electrocautery.  The saphenous vein and nerve were identified and  reflected anteriorly and preserved.  The gastrocnemius fascia incised.  The popliteal fossa entered.  The distal left popliteal artery was  identified.  This was freed from the vein and nerve and encircled with  vessel loop.  There is an excellent pulse present.  Distal dissection  carried down along the popliteal artery to the origin of the anterior  tibial artery which  was freed and encircled with vessel loop.  The  tibioperoneal trunk was then dissected out.  The veins were mobilized  and the origin of the posterior tibial and peroneal arteries were also  freed and encircled with vessel loops.   The patient administered 5000 units heparin intravenously.  Adequate  circulation time permitted.  The left popliteal arteries controlled  proximally with Henley clamp and each the tibial vessels controlled with  fine bulldog clamps.  The transverse arteriotomy made in the distal left  popliteal artery.  The 3 Fogarty was passed down into the left anterior  tibial artery.  This returned organized thrombus consistent with embolic  material.  This was endarterectomized several times until no further  return of embolic material.  Flushed distally with heparin saline  solution.   Similar embolectomy was carried out when the peroneal and posterior  tibial arteries were thrombectomized.  Several passes made down each  vessel with no return of further thrombus.  The Fogarty was passed  proximally without any return of thrombus.  Transverse arteriotomy was  closed with running 6-0 Prolene  suture.  Clamps were then removed after  flushing.  Excellent flow present with excellent signals in the tibial  vessels distally.   Adequate hemostasis obtained.  Sponge and instrument counts correct.  The gastrocnemius fascia closed with interrupted 2-0 Vicryl suture.  Subcutaneous tissue closed with running 3-0 Vicryl suture.  Skin closed  with 4-0 Monocryl.  Steri-Strips applied.   The patient tolerated procedure well.  There were no apparent  complications.  Transferred to recovery in stable condition.      [name], M.D.  Electronically Signed     PGH/MEDQ  D:  12/03/2006  T:  12/04/2006  Job:  [id number]

## 2010-06-29 NOTE — Procedures (Signed)
BYPASS GRAFT EVALUATION   INDICATION:  Followup left common iliac artery stent and popliteal-  tibial embolectomy.  The patient states no claudication at this time.   HISTORY:  Diabetes:  No.  Cardiac:  No.  Hypertension:  Yes.  Smoking:  Yes.  Previous Surgery:  Left common iliac artery stent and popliteal-tibial  thromboembolectomy 08/16/2006.  Left common iliac artery PTA 08/06/2008.  Both by Dr. Madilyn Fireman.   SINGLE LEVEL ARTERIAL EXAM                               RIGHT              LEFT  Brachial:                    133                140  Anterior tibial:             145                147  Posterior tibial:            165                152  Peroneal:  Ankle/brachial index:        1.18               1.09   PREVIOUS ABI:  Date:  07/24/2008  RIGHT:  1.07  LEFT:  0.72   LOWER EXTREMITY BYPASS GRAFT DUPLEX EXAM:   DUPLEX:  Patent left common iliac artery stent with elevated velocities  of 254 cm/s.  Patent left lower extremity arteries where visualized with no evidence  of stenosis besides within left common iliac artery stent.  Triphasic flow throughout duplexed arteries.   IMPRESSION:  1. Patent left common iliac artery stent with elevated velocities.  2. Patent left lower extremity arteries where visualized.  3. Bilateral ankle brachial indices appear within normal limits with      the left showing a significant increase from preop study.  4. Appointment scheduled to see Dr. Arbie Cookey at time of next study due to      elevated velocities in the left common iliac artery stent, however,      asymptomatic at this time.         ___________________________________________  Larina Earthly, M.D.   AS/MEDQ  D:  02/11/2009  T:  02/11/2009  Job:  [id number]

## 2010-06-29 NOTE — Assessment & Plan Note (Signed)
OFFICE VISIT   DYLAND, PANUCO  DOB:  Apr 15, 1950                                       09/07/2006  ZOXWR#:[id number]   On August 16, 2006.  This unfortunately was complicated by atheroemboli to  the left tibial vessels requiring a left popliteal tibial  thromboembolectomy.  He recovered from this well.  Was discharged home  in 2 days.  Does have some residual swelling in his left leg.   His left iliac PT and stenting was otherwise a successful technical  procedure.  Left ABI is now 1.0.  Preoperative ABI was 0.73.   No claudication symptoms.  Incisions are well-healed.  Blood pressure  135/84, pulse 76 per minute regular, respirations 18 per minute.   When the patient completes his 6 week course of Plavix he will remain on  aspirin 325 mg daily to return per routine protocol for surveillance of  his left common iliac stent.   [name], M.D.  Electronically Signed   PGH/MEDQ  D:  09/07/2006  T:  09/08/2006  Job:  [id number]   cc:   Colleen Can. Deborah Chalk, M.D.  [facility]

## 2010-06-29 NOTE — Consult Note (Signed)
NEW PATIENT CONSULTATION   Ryan Copeland, LEVINSON R  DOB:  10/29/1950                                       07/13/2006  ZOXWR#:[id number]   CONSULTATION PHYSICIAN:  [name], M.D.   CHIEF COMPLAINT:  Left lower extremity pain.   HISTORY:  The patient is a 60 year old male referred for evaluation of  left lower extremity pain.  This is brought on by ambulation.  Primarily  in the left buttock. Does radiate down into the left thigh.  Relieved by  rest.  No rest pain or night pain.  Does have some mild discomfort in  his right leg, but it is primarily limited by his left leg.   PAST MEDICAL HISTORY:  1. Hyperlipidemia.  2. Coronary artery disease.  3. Tobacco abuse.  4. Chronic right flank pain.  5. Status post cholecystectomy.  6. Statin intolerance.   MEDICATIONS:  1. Nexium 40 mg daily.  2. Crestor 10 mg daily.  3. Chantix .5 mg b.i.d.   ALLERGIES:  CODEINE, STATIN INTOLERANCE.   SOCIAL HISTORY:  The patient is married with 2 children.  He is an  Art gallery manager.  He smokes 1 pack of cigarettes daily.  Has 2 to 3 alcoholic  beverages daily.   FAMILY HISTORY:  Patient said mother died of lung cancer at age 15.  His  father died at age 96 with a history of peripheral vascular disease and  congestive heart failure.  He has got 1 sister age 30 who has a history  of heart disease and a pacemaker.   REVIEW OF SYSTEMS:  Patient denies any weight change or anorexia.  No  chest pain or shortness of breath.  No cough or sputum production.  He  does have chronic abdominal pain of the right flank.  This has been  worked up extensively.  He also has pain in his legs with walking, left  greater than right.   PHYSICAL EXAMINATION:  GENERAL:  Well-appearing 60 year old male.  VITAL SIGNS:  6 feet tall, 215 pounds.  BP 134/86, pulse 73 per minute,  respirations 18 per minute.  NECK:  Reveals no thyromegaly or adenopathy.  CARDIOVASCULAR:  No carotid bruits.  Heart sounds  normal without  murmurs.  CHEST:  Clear.  No rales or rhonchi.  ABDOMEN:  Soft.  Nontender.  No masses or organomegaly.  EXTREMITIES:  2+ right femoral, absence left femoral pulse.  2+ right  popliteal posterior tibial and dorsalis pedis pulses.  Absent left  popliteal posterior tibial and dorsalis pedis pulse.   INVESTIGATIONS:  Lower extremity Doppler evaluation carried [facility] lab, ankle brachial indices 1.15 on the right, .73 on the left.  Wave forms monophasic throughout the left lower extremity consistent  with a left iliofemoral occlusive disease.   IMPRESSION:  1. Lower extremity claudication more severely effecting the left leg      consistent with iliac occlusive disease.  2. Coronary artery disease.  3. Hyperlipidemia.  4. Ongoing tobacco abuse.  5. Chronic abdominal pain.   RECOMMENDATIONS:  Patient will be scheduled for abdominal aortogram with  bilateral lower extremity run off arteriography.  Possible percutaneous  intervention.  Details of planned procedure and potential intervention  explained to the patient and his wife.  Major morbidity  mortality 1%.  This includes potential emergency surgery.  Patient will  began Plavix 75 mg daily 1 week prior to scheduled procedure.   [name], M.D.  Electronically Signed   PGH/MEDQ  D:  07/13/2006  T:  07/14/2006  Job:  [id number]   cc:   [facility]. Deborah Chalk, M.D.

## 2010-07-02 NOTE — Cardiovascular Report (Signed)
NAME:  Ryan Copeland, STERN NO.:  [id number]   MEDICAL RECORD NO.:  [id number]                   PATIENT TYPE:  INP   LOCATION:  3731                                 FACILITY:  [facility]   PHYSICIAN:  Nanetta Batty, M.D.                DATE OF BIRTH:  [birth year]   DATE OF PROCEDURE:  05/15/2003  DATE OF DISCHARGE:                              CARDIAC CATHETERIZATION   INDICATIONS:  Mr. Torelli is a 60 year old married white male patient of Dr.  Fredirick Maudlin who underwent diagnostic coronary arteriography December `1996  revealing a total RCA with normal LAD and circ.  He has a history of  hypertension, hyperlipidemia, continued tobacco abuse.  He is admitted March  31 with prolonged chest pain.  There are no acute EKG changes and his  __________markers were negative.  He was heparinized and placed on IV nitro  and presents for diagnostic coronary arteriography.   PROCEDURE DESCRIPTION:  The patient was brought to the second floor [facility] in the postabsorptive state.  He was  premedicated with p.o. Valium.  His right groin was prepped and shaved in  the usual sterile fashion.  1% Xylocaine was used for local anesthesia.  A 6  French sheath was inserted into the right femoral artery using standard  Seldinger technique. A 6 French right and left Judkins diagnostic catheter  as well as 6 French pigtail catheter were used for selective coronary  angiography, left ventriculography, and distal abdominal aortography.  Omnipaque dye was used for the entirety of the case. Retrograde aortic,  ventricular pullback pressures were recorded.   HEMODYNAMICS:  1. Aortic systolic pressure 154, diastolic pressure 80.  2. Left ventricular systolic pressure 155, end-diastolic pressure 19.   SELECTIVE CORONARY ANGIOGRAPHY:  1. Left main normal.  2. LAD:  The LAD has a fairly focal 50-60%  mid stenosis at the takeoff of     the first diagonal branch.  3. Left circumflex:  Free of significant disease.  4. Right coronary artery:  Chronically occluded at the genu with     bidirectional collaterals.   LEFT VENTRICULOGRAPHY:  RAO left ventriculogram was performed using 25 mL of  Omnipaque dye at 12 mL per second.  The overall LVEF was estimated at  greater than 60% without focal wall motion abnormalities.   DISTAL ABDOMINAL AORTOGRAPHY:  Distal abdominal aortogram was performed  using 20 mL of Omnipaque dye, 20 mL per second.  The renal arteries were  widely patent.  The infrarenal abdominal aorta and iliac bifurcation  appeared free of significant atherosclerotic changes.   IMPRESSION:  Mr. [name] has known totally occluded RCA which is old and 89-  60% focal mid LAD lesion.  I do not that his anatomy is consistent with his  symptoms.  It is certainly possible that his LAD lesion is physiologically  significant though it does not appear  so angiographically.  Plans will be  for aggressive medical therapy and outpatient Cardiolite stress testing  after which he will follow up with Dr. Clarene Duke for further evaluation.   Sheaths were removed and pressure was held to the groin to achieve  hemostasis.  The patient left the lab in stable condition.                                               Nanetta Batty, M.D.    Cordelia Pen  D:  05/15/2003  T:  05/16/2003  Job:  [id number]   cc:   [facility]  [address]  [place], [place] [address]   Thereasa Solo. Little, M.D.  [address]  [address]  [place]  [place] [address]  Fax: [phone]

## 2010-07-02 NOTE — Op Note (Signed)
NAME:  Ryan Copeland, Ryan Copeland NO.:  [id number]   MEDICAL RECORD NO.:  [id number]          PATIENT TYPE:  OIB   LOCATION:  2550                         FACILITY:  [facility]   PHYSICIAN:  Cherylynn Ridges, M.D.    DATE OF BIRTH:  Apr 30, 1950   DATE OF PROCEDURE:  01/24/2005  DATE OF DISCHARGE:                                 OPERATIVE REPORT   PREOPERATIVE DIAGNOSIS:  Symptomatic gallbladder disease.   POSTOPERATIVE DIAGNOSIS:  Symptomatic gallbladder disease with dense  adhesions to the gallbladder and the right upper quadrant.   PROCEDURE:  Laparoscopic cholecystectomy with cholangiogram.   ANESTHESIA:  General endotracheal.   ESTIMATED BLOOD LOSS:  Less than 20 mL.   COMPLICATIONS:  None.   CONDITION:  Stable.   FINDINGS:  A large number of adhesions to the gallbladder and the right  upper quadrant tethering the gallbladder to the liver and surrounding  structures.   INDICATIONS FOR OPERATION:  The patient is a 60 year old without stones but  with right upper quadrant pain, postprandially especially with fatty foods  who comes in now for the elective laparoscopic cholecystectomy.   OPERATION:  The patient was taken to the operating room and placed on the  table in the supine position. After an adequate endotracheal anesthetic was  administered, he was prepped and draped in the usual sterile manner exposing  the midline and the right upper quadrant.   A supraumbilical curvilinear incision was made using a #11 blade taken down  to the midline fascia. It was through this midline fascia that a Veress  needle was passed into the peritoneal cavity and confirmed to be in position  with the saline test. Once this was done, an OptiVu trocar and cannula with  the camera light source was passed through the supraumbilical fascia into  the peritoneal cavity after the abdomen had been insufflated up to a maximal  intra-abdominal pressure of 50 mmHg.   Once the trocar and  cannula were in place, two right costal margin 5-mm  cannulas and a subxiphoid 11 and 12 mm cannula were passed under direct  vision into the peritoneal cavity. The patient was placed in steep reverse  Trendelenburg. The left side was tilted down and the dissection begun.   As mentioned previously, there were dense adhesions to the gallbladder,  especially to the right side which were  taken down without tearing the  liver using electrocautery and a dissector. We were subsequently able to  free up the body of the gallbladder and then the infundibulum which was also  encased with the inflammatory scar tissue. Once we had the infundibulum  freed, we were able to dissect out the peritoneum overlying the triangle of  Fallot and the hepatoduodenal triangle isolating both the cystic duct and  the cystic artery. The cystic artery was doubly ligated proximally and then  distally along the gallbladder side, endoclipped and transected. The cystic  duct was clipped along the gallbladder side. A cholecystodochotomy made  using laparoscopic scissors. Then, a cholangiogram with good catheter which  had been passed through the intra-abdominal wall  was done through the  cholecystodochotomy. This demonstrated good easy flow into the duodenum,  good distal flow, no intraductal filling defects, good proximal flow, and no  evidence of obstruction.   Once a cholangiogram was completed, we removed the securing clip then  distally clipped the cystic duct with double hemoclips. We transected the  cystic duct and dissected out the gallbladder from its bed with minimal  difficulty.   We used an EndoCatch bag to secure the gallbladder and bring it out through  the supraumbilical site. Once this was done, we irrigated with about a 1 L  of saline to remove some bile which was spilled during the dissection. All  counts were correct. The bile had come from the gallbladder which was open  partially during the  dissection. Once we irrigated copiously and cautery was  used to obtain hemostasis in the gallbladder bed, we removed all fluid and  gas using the aspirator.   The supraumbilical site was closed using a figure-of-eight stitch of 0-  Vicryl passed under UR-6 needle. Once this was done, 0.25% Marcaine with  epinephrine was injected at all sites and the skin was closed using a  running subcuticular stitch of 4-0 Vicryl. A sterile dressing was applied.  All needle, sponge, and instrument counts were correct.      Cherylynn Ridges, M.D.  Electronically Signed     JOW/MEDQ  D:  01/24/2005  T:  01/24/2005  Job:  [id number]

## 2010-07-02 NOTE — Discharge Summary (Signed)
NAME:  Ryan Copeland, Ryan Copeland NO.:  [id number]   MEDICAL RECORD NO.:  [id number]                   PATIENT TYPE:  INP   LOCATION:  3731                                 FACILITY:  [facility]   PHYSICIAN:  Nanetta Batty, M.D.                DATE OF BIRTH:  [birth year]   DATE OF ADMISSION:  05/14/2003  DATE OF DISCHARGE:  05/16/2003                                 DISCHARGE SUMMARY   ADMISSION DIAGNOSES:  1. Chest pain.  2. History of hyperlipidemia.  3. Ongoing tobacco use.  4. Gastroesophageal reflux disease.   DISCHARGE DIAGNOSES:  1. Chest pain.  2. History of hyperlipidemia.  3. Ongoing tobacco use.  4. Gastroesophageal reflux disease.   PROCEDURES:  Cardiac catheterization May 15, 2003, Dr. Allyson Sabal.   BRIEF HISTORY:  The patient is a 60 year old white male medical patient of  Dr. Caprice Kluver.  He has no primary care physician.  He was found to have  coronary artery disease with collaterals and no PCI was performed.  He also  has a history of some hypertension and hyperlipidemia, smoker.  The day  prior to admission, he was sitting at his desk working at the computer when  he developed chest pain.  It was across his chest, heavy weight, pressure,  without radiation.  No shortness of breath, no diaphoresis, no nausea or  vomiting.  Chest discomfort remained present and constant.  He had no relief  with Tylenol.  In the ER, he was given nitroglycerin and was placed on  nitroglycerin patch.  He eventually developed relief.  He had no history of  chest pain prior to yesterday.  He denied syncope or presyncopal feelings.   PAST MEDICAL HISTORY:  Significant for coronary artery disease.  He  underwent cardiac catheterization in 1996 which showed total occlusion of  the RCA with left and right collaterals.  The circumflex and LAD were  normal.  EF was 59%.   SOCIAL HISTORY:  He is married.  He has one son and one stepdaughter.  He is  an Public affairs consultant.   He does no physical labor.  He smokes one pack a  day for 35 years.   ALLERGIES:  CODEINE.  He has no history of dye allergy.   MEDICATIONS ON ADMISSION:  1. Nexium 40 mg daily.  2. Diltiazem 120 mg daily.  3. Aspirin 325 mg daily.   FAMILY HISTORY:  His father died at 61 with coronary artery disease.  No  other significant family history.   HOSPITAL COURSE:  The patient was admitted.  Point of care cardiac markers  were all negative.  He was then scheduled and taken to the catheterization  lab on the day of admission.  There, cardiac catheterization showed a 100%  occlusion midportion of the RCA with good left and right collaterals.  He  had a new lesion in the LAD overlying  the first diagonal which showed a 50%-  60% stenosis.  His left ventricular function was normal.  It was Dr. Hazle Coca  opinion that no intervention was needed at this time.  The new lesion was  not critical and the patient could be managed medically.  The following day,  he still complained of some chest pressure and back discomfort.  He was  fairly unhappy.  He was seen by Dr. Elsie Lincoln and it was ultimately decided to  increase his Lopressor to 25 mg b.i.d., place him on a statin drug, obtain a  stress Cardiolite the following week and follow up with Dr. Clarene Duke.  The  patient was in agreement with this and was subsequently scheduled for  discharge that day.  His labs showed that his point of care markers  troponin, CK, and myoglobin were negative.  His cholesterol during his  admission showed 242, triglycerides of 140, HDL of 32, and an LDL of 160.  His labs on admission were normal with a white count of 11.2, a hemoglobin  of 15.5, hematocrit of 45.7, and a platelet count of 286,000.  Electrolytes  are normal.  BUN is 10, creatinine is 0.8, glucose is 101.   DISCHARGE MEDICATIONS:  The patient was discharged on May 16, 2002 on:  1. Aspirin 325 mg daily.  2. Nexium 40 mg daily.  3. His Cardizem was  discontinued and he was placed on Lopressor 25 mg b.i.d.  4. Vytorin 10/20 one h.s.  5. Wellbutrin XL 150 per day.   The patient was scheduled to return to [facility] for  Cardiolite and on Tuesday, April 5 at 2:05 p.m. and then follow up with Dr.  Clarene Duke on June 05, 2003.   DISCHARGE ACTIVITY:  Light as tolerated.  His EKG on admission showed a  sinus rhythm with no significant ST-T wave changes, ventricular rate was 67.   CONDITION AT DISCHARGE:  Improving.      Eber Hong, P.A.                 Nanetta Batty, M.D.    WDJ/MEDQ  D:  06/06/2003  T:  06/08/2003  Job:  [id number]   cc:   Thereasa Solo. Little, M.D.  [address]  [address]  [place]  [place] [address]  Fax: [phone]

## 2010-08-05 ENCOUNTER — Encounter (HOSPITAL_BASED_OUTPATIENT_CLINIC_OR_DEPARTMENT_OTHER): Payer: BC Managed Care – PPO | Admitting: Oncology

## 2010-08-05 ENCOUNTER — Other Ambulatory Visit: Payer: Self-pay | Admitting: Oncology

## 2010-08-05 DIAGNOSIS — D47Z9 Other specified neoplasms of uncertain behavior of lymphoid, hematopoietic and related tissue: Secondary | ICD-10-CM

## 2010-08-05 DIAGNOSIS — C9110 Chronic lymphocytic leukemia of B-cell type not having achieved remission: Secondary | ICD-10-CM

## 2010-08-05 LAB — CBC WITH DIFFERENTIAL/PLATELET
Basophils Absolute: 0.2 10*3/uL — ABNORMAL HIGH (ref 0.0–0.1)
Eosinophils Absolute: 0.1 10*3/uL (ref 0.0–0.5)
HGB: 16.2 g/dL (ref 13.0–17.1)
MCV: 92.4 fL (ref 79.3–98.0)
MONO#: 0.2 10*3/uL (ref 0.1–0.9)
NEUT#: 8.9 10*3/uL — ABNORMAL HIGH (ref 1.5–6.5)
RDW: 14.4 % (ref 11.0–14.6)
WBC: 36.8 10*3/uL — ABNORMAL HIGH (ref 4.0–10.3)
lymph#: 27.5 10*3/uL — ABNORMAL HIGH (ref 0.9–3.3)

## 2010-08-11 LAB — COMPREHENSIVE METABOLIC PANEL
ALT: 27 U/L (ref 0–53)
AST: 21 U/L (ref 0–37)
CO2: 21 mEq/L (ref 19–32)
Creatinine, Ser: 0.98 mg/dL (ref 0.50–1.35)
Total Bilirubin: 0.5 mg/dL (ref 0.3–1.2)

## 2010-08-11 LAB — IGG, IGA, IGM: IgM, Serum: 28 mg/dL — ABNORMAL LOW (ref 41–251)

## 2010-08-11 LAB — DIRECT ANTIGLOBULIN TEST (NOT AT ARMC): DAT IgG: NEGATIVE

## 2010-08-11 LAB — LACTATE DEHYDROGENASE: LDH: 153 U/L (ref 94–250)

## 2010-08-31 ENCOUNTER — Ambulatory Visit: Payer: Self-pay | Admitting: Vascular Surgery

## 2010-09-06 ENCOUNTER — Encounter (INDEPENDENT_AMBULATORY_CARE_PROVIDER_SITE_OTHER): Payer: BC Managed Care – PPO

## 2010-09-06 DIAGNOSIS — I739 Peripheral vascular disease, unspecified: Secondary | ICD-10-CM

## 2010-09-06 DIAGNOSIS — Z48812 Encounter for surgical aftercare following surgery on the circulatory system: Secondary | ICD-10-CM

## 2010-09-22 NOTE — Procedures (Unsigned)
AORTA-ILIAC DUPLEX EVALUATION  INDICATION:  Follow up peripheral vascular disease.  HISTORY: Diabetes:  Yes Cardiac:  Stent Hypertension:  yes Smoking:  Currently Previous Surgery:  Left CIA and popliteal-tibial thrombectomy 08/16/2006; left CIA percutaneous transluminal angioplasty on 08/06/2008.              SINGLE LEVEL ARTERIAL EXAM                             RIGHT                  LEFT Brachial:                  127                    137 Anterior tibial:           146                    157 Posterior tibial:          138                    142 Peroneal: Ankle/brachial index:      1.07                   1.15 Previous ABI/date: 08/31/2009                     1.03 1.01.  AORTA-ILIAC DUPLEX EXAM Aorta - Proximal     87 Cm/s Aorta - Mid          69 cm/s Aorta - Distal       69 cm/s  RIGHT                                   LEFT 213 cm/s          CIA-PROXIMAL          267/248 cm/s 160 cm/s          CIA-DISTAL            107 cm/s N/V               HYPOGASTRIC           N/V 122 cm/s          EIA-PROXIMAL          165 cm/s N/V               EIA-MID               107 cm/s 88 cm/s           EIA-DISTAL            126 cm/s  IMPRESSION: 1. Technically difficult and limited evaluation of the aorta and iliac     arteries due to patient body habitus and excessive bowel gas. 2. Multiple 50% to 75% stenoses involving the bilateral common iliac     arteries. 3. Patent aorta present without hemodynamically significant stenosis     identified.  ___________________________________________ V. Charlena Cross, MD  SH/MEDQ  D:  09/06/2010  T:  09/06/2010  Job:  [id number]

## 2010-11-08 ENCOUNTER — Encounter (HOSPITAL_BASED_OUTPATIENT_CLINIC_OR_DEPARTMENT_OTHER): Payer: BC Managed Care – PPO | Admitting: Oncology

## 2010-11-08 ENCOUNTER — Other Ambulatory Visit: Payer: Self-pay | Admitting: Oncology

## 2010-11-08 DIAGNOSIS — Z87891 Personal history of nicotine dependence: Secondary | ICD-10-CM

## 2010-11-08 DIAGNOSIS — D45 Polycythemia vera: Secondary | ICD-10-CM

## 2010-11-08 DIAGNOSIS — D47Z9 Other specified neoplasms of uncertain behavior of lymphoid, hematopoietic and related tissue: Secondary | ICD-10-CM

## 2010-11-08 DIAGNOSIS — D696 Thrombocytopenia, unspecified: Secondary | ICD-10-CM

## 2010-11-08 DIAGNOSIS — C9110 Chronic lymphocytic leukemia of B-cell type not having achieved remission: Secondary | ICD-10-CM

## 2010-11-08 LAB — COMPREHENSIVE METABOLIC PANEL
Albumin: 4.2 g/dL (ref 3.5–5.2)
Alkaline Phosphatase: 102 U/L (ref 39–117)
CO2: 24 mEq/L (ref 19–32)
Glucose, Bld: 216 mg/dL — ABNORMAL HIGH (ref 70–99)
Potassium: 3.9 mEq/L (ref 3.5–5.3)
Sodium: 140 mEq/L (ref 135–145)
Total Protein: 7.1 g/dL (ref 6.0–8.3)

## 2010-11-08 LAB — CBC WITH DIFFERENTIAL/PLATELET
Basophils Absolute: 0.2 10*3/uL — ABNORMAL HIGH (ref 0.0–0.1)
Eosinophils Absolute: 0.1 10*3/uL (ref 0.0–0.5)
HGB: 17.7 g/dL — ABNORMAL HIGH (ref 13.0–17.1)
MONO#: 2.1 10*3/uL — ABNORMAL HIGH (ref 0.1–0.9)
NEUT#: 6.7 10*3/uL — ABNORMAL HIGH (ref 1.5–6.5)
RDW: 14.8 % — ABNORMAL HIGH (ref 11.0–14.6)
lymph#: 38.7 10*3/uL — ABNORMAL HIGH (ref 0.9–3.3)

## 2010-11-08 LAB — URIC ACID: Uric Acid, Serum: 10 mg/dL — ABNORMAL HIGH (ref 4.0–7.8)

## 2010-11-09 LAB — HAPTOGLOBIN: Haptoglobin: 182 mg/dL (ref 30–200)

## 2010-11-09 LAB — DIRECT ANTIGLOBULIN TEST (NOT AT ARMC): DAT IgG: NEGATIVE

## 2010-11-30 LAB — BASIC METABOLIC PANEL
BUN: 9
GFR calc non Af Amer: >60
Glucose, Bld: 132 — ABNORMAL HIGH
Potassium: 4.2

## 2010-11-30 LAB — CBC
HCT: 41.7
MCV: 95.2
Platelets: 220
RDW: 13.4

## 2010-12-01 LAB — COMPREHENSIVE METABOLIC PANEL
ALT: 42
Albumin: 3.9
Alkaline Phosphatase: 69
Potassium: 4.5
Sodium: 138
Total Protein: 6.7

## 2010-12-01 LAB — CBC
Hemoglobin: 17
Platelets: 245
RDW: 13

## 2010-12-01 LAB — PROTIME-INR
INR: 1
Prothrombin Time: 13.8

## 2010-12-03 ENCOUNTER — Encounter (HOSPITAL_BASED_OUTPATIENT_CLINIC_OR_DEPARTMENT_OTHER): Payer: BC Managed Care – PPO | Admitting: Oncology

## 2010-12-03 ENCOUNTER — Other Ambulatory Visit: Payer: Self-pay | Admitting: Oncology

## 2010-12-03 DIAGNOSIS — C9110 Chronic lymphocytic leukemia of B-cell type not having achieved remission: Secondary | ICD-10-CM

## 2010-12-03 LAB — MANUAL DIFFERENTIAL
ANC (CHCC manual diff): 11.1 10*3/uL — ABNORMAL HIGH (ref 1.5–6.5)
Basophil: 1 % (ref 0–2)
Blasts: 0 % (ref 0–0)
Metamyelocytes: 0 % (ref 0–0)
PLT EST: DECREASED
PROMYELO: 0 % (ref 0–0)

## 2010-12-03 LAB — CBC WITH DIFFERENTIAL/PLATELET
MCH: 32.5 pg (ref 27.2–33.4)
MCHC: 34.3 g/dL (ref 32.0–36.0)
RDW: 14.2 % (ref 11.0–14.6)

## 2011-01-03 ENCOUNTER — Other Ambulatory Visit: Payer: Self-pay | Admitting: Oncology

## 2011-01-03 ENCOUNTER — Other Ambulatory Visit (HOSPITAL_BASED_OUTPATIENT_CLINIC_OR_DEPARTMENT_OTHER): Payer: BC Managed Care – PPO | Admitting: Lab

## 2011-01-03 ENCOUNTER — Telehealth: Payer: Self-pay | Admitting: *Deleted

## 2011-01-03 DIAGNOSIS — C9110 Chronic lymphocytic leukemia of B-cell type not having achieved remission: Secondary | ICD-10-CM

## 2011-01-03 LAB — CBC WITH DIFFERENTIAL/PLATELET
Basophils Absolute: 1 10*3/uL — ABNORMAL HIGH (ref 0.0–0.1)
EOS%: 0.1 % (ref 0.0–7.0)
HGB: 16.8 g/dL (ref 13.0–17.1)
MCH: 31.7 pg (ref 27.2–33.4)
NEUT#: 7.2 10*3/uL — ABNORMAL HIGH (ref 1.5–6.5)
RBC: 5.3 10*6/uL (ref 4.20–5.82)
RDW: 14.6 % (ref 11.0–14.6)
lymph#: 45.6 10*3/uL — ABNORMAL HIGH (ref 0.9–3.3)

## 2011-01-03 LAB — TECHNOLOGIST REVIEW

## 2011-01-03 NOTE — Telephone Encounter (Signed)
Per MD, notified pt labs ok. Keep doing what you're doing.

## 2011-01-31 ENCOUNTER — Other Ambulatory Visit: Payer: Self-pay | Admitting: Oncology

## 2011-01-31 ENCOUNTER — Other Ambulatory Visit (HOSPITAL_BASED_OUTPATIENT_CLINIC_OR_DEPARTMENT_OTHER): Payer: BC Managed Care – PPO | Admitting: Lab

## 2011-01-31 DIAGNOSIS — C9110 Chronic lymphocytic leukemia of B-cell type not having achieved remission: Secondary | ICD-10-CM

## 2011-01-31 LAB — CBC WITH DIFFERENTIAL/PLATELET
Basophils Absolute: 0.5 10*3/uL — ABNORMAL HIGH (ref 0.0–0.1)
EOS%: 0.2 % (ref 0.0–7.0)
HCT: 49.6 % (ref 38.4–49.9)
HGB: 16.8 g/dL (ref 13.0–17.1)
MCH: 31.8 pg (ref 27.2–33.4)
MCV: 93.8 fL (ref 79.3–98.0)
MONO%: 2.8 % (ref 0.0–14.0)
NEUT#: 8.6 10*3/uL — ABNORMAL HIGH (ref 1.5–6.5)
NEUT%: 14.1 % — ABNORMAL LOW (ref 39.0–75.0)
WBC: 60.8 10*3/uL (ref 4.0–10.3)

## 2011-02-01 ENCOUNTER — Telehealth: Payer: Self-pay | Admitting: *Deleted

## 2011-02-01 NOTE — Telephone Encounter (Signed)
Notified pt per MD- Labs are stable

## 2011-02-12 ENCOUNTER — Telehealth: Payer: Self-pay | Admitting: Oncology

## 2011-02-12 NOTE — Telephone Encounter (Signed)
Called pt,left message appt for 03/18/11 lab and MD

## 2011-03-11 ENCOUNTER — Encounter: Payer: Self-pay | Admitting: Surgery

## 2011-03-14 ENCOUNTER — Ambulatory Visit: Payer: BC Managed Care – PPO | Admitting: Surgery

## 2011-03-14 ENCOUNTER — Other Ambulatory Visit: Payer: Self-pay | Admitting: *Deleted

## 2011-03-14 DIAGNOSIS — I70219 Atherosclerosis of native arteries of extremities with intermittent claudication, unspecified extremity: Secondary | ICD-10-CM

## 2011-03-18 ENCOUNTER — Encounter: Payer: Self-pay | Admitting: Oncology

## 2011-03-18 ENCOUNTER — Ambulatory Visit (HOSPITAL_BASED_OUTPATIENT_CLINIC_OR_DEPARTMENT_OTHER): Payer: BC Managed Care – PPO | Admitting: Oncology

## 2011-03-18 ENCOUNTER — Other Ambulatory Visit (HOSPITAL_BASED_OUTPATIENT_CLINIC_OR_DEPARTMENT_OTHER): Payer: BC Managed Care – PPO | Admitting: Lab

## 2011-03-18 VITALS — BP 132/77 | HR 55 | Temp 98.4°F | Ht 71.0 in | Wt 228.9 lb

## 2011-03-18 DIAGNOSIS — C9110 Chronic lymphocytic leukemia of B-cell type not having achieved remission: Secondary | ICD-10-CM

## 2011-03-18 DIAGNOSIS — C8308 Small cell B-cell lymphoma, lymph nodes of multiple sites: Secondary | ICD-10-CM | POA: Insufficient documentation

## 2011-03-18 HISTORY — DX: Chronic lymphocytic leukemia of B-cell type not having achieved remission: C91.10

## 2011-03-18 LAB — CBC WITH DIFFERENTIAL/PLATELET
BASO%: 1.3 % (ref 0.0–2.0)
Eosinophils Absolute: 0 10*3/uL (ref 0.0–0.5)
LYMPH%: 83.1 % — ABNORMAL HIGH (ref 14.0–49.0)
MCH: 31.9 pg (ref 27.2–33.4)
MCHC: 33.8 g/dL (ref 32.0–36.0)
MCV: 94.3 fL (ref 79.3–98.0)
MONO%: 4 % (ref 0.0–14.0)
Platelets: 108 10*3/uL — ABNORMAL LOW (ref 140–400)
RBC: 5.21 10*6/uL (ref 4.20–5.82)

## 2011-03-18 LAB — TECHNOLOGIST REVIEW

## 2011-03-19 LAB — COMPREHENSIVE METABOLIC PANEL
ALT: 15 U/L (ref 0–53)
CO2: 23 mEq/L (ref 19–32)
Creatinine, Ser: 1.02 mg/dL (ref 0.50–1.35)
Total Bilirubin: 0.4 mg/dL (ref 0.3–1.2)

## 2011-03-19 LAB — HAPTOGLOBIN: Haptoglobin: 148 mg/dL (ref 30–200)

## 2011-03-19 LAB — LACTATE DEHYDROGENASE: LDH: 141 U/L (ref 94–250)

## 2011-03-19 LAB — IGG, IGA, IGM: IgM, Serum: 18 mg/dL — ABNORMAL LOW (ref 41–251)

## 2011-03-20 NOTE — Progress Notes (Signed)
OFFICE PROGRESS NOTE  CC  [name], PA, PA-C [address] [place] [address]  DIAGNOSIS: 61 year old gentleman with  #1 stage 0 CLL.  #2 polycythemia likely secondary now resolved.  PRIOR THERAPY: Observation  CURRENT THERAPY: Observation  INTERVAL HISTORY: Ryan Copeland 61 y.o. male returns for followup visit today. Overall clinically he is doing well he is without any significant complaints. He is trying to lose weight and has lost a few pounds. He otherwise denies any fevers chills night sweats headaches shortness of breath chest pains palpitations he continues to work full-time. He has no myalgias or arthralgias no easy bruising no bleeding no changes in his bowel or bladder habits. No myalgias or arthralgias. No recent flus or pneumonias. No recent hospitalizations. Remainder of the 10 point review of systems is negative.  MEDICAL HISTORY: Past Medical History  Diagnosis Date  . Cancer     cll  . MI, acute, non ST segment elevation 06/28/2009  . Hyperlipidemia   . Tobacco abuse   . PVD (peripheral vascular disease)   . 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  MEDICATIONS:  Current Outpatient Prescriptions  Medication Sig Dispense Refill  . aspirin 325 MG tablet Take 325 mg by mouth daily.        Marland Kitchen esomeprazole (NEXIUM) 40 MG capsule Take 40 mg by mouth daily before breakfast.        . metoprolol (TOPROL XL) 50 MG 24 hr tablet Take 1 tablet (50 mg total) by mouth daily.  30 tablet  11  . nitroGLYCERIN (NITROSTAT) 0.4 MG SL tablet Place 1 tablet (0.4 mg total) under the tongue every 5 (five) minutes as needed.  25 tablet  12  . prasugrel (EFFIENT) 10 MG TABS Take 1 tablet (10 mg total) by mouth daily.  30 tablet  11  . Pregabalin (LYRICA PO) Take 75 mg by mouth. Take 1 tablet three daily,and take 2 tablets at bedtime.      . rosuvastatin (CRESTOR) 10 MG tablet Take 1 tablet (10 mg total) by mouth daily.  30 tablet   11  . topiramate (TOPAMAX) 25 MG capsule Take 25 mg by mouth 2 (two) times daily.        SURGICAL HISTORY: No past surgical history on file.  REVIEW OF SYSTEMS:  Pertinent items are noted in HPI.   PHYSICAL EXAMINATION: General appearance: alert, cooperative, appears stated age and no distress Neck: mild anterior cervical adenopathy, no carotid bruit, no JVD, supple, symmetrical, trachea midline and thyroid not enlarged, symmetric, no tenderness/mass/nodules Lymph nodes: The patient is noted to have left supraclavicular and some shotty cervical adenopathy. Resp: clear to auscultation bilaterally and normal percussion bilaterally Back: symmetric, no curvature. ROM normal. No CVA tenderness. Cardio: regular rate and rhythm, S1, S2 normal, no murmur, click, rub or gallop GI: soft, non-tender; bowel sounds normal; no masses,  no organomegaly Extremities: extremities normal, atraumatic, no cyanosis or edema Neurologic: Alert and oriented X 3, normal strength and tone. Normal symmetric reflexes. Normal coordination and gait  ECOG PERFORMANCE STATUS: 0 - Asymptomatic  Blood pressure 132/77, pulse 55, temperature 98.4 F (36.9 C), temperature source Oral, height 5\' 11"  (1.803 m), weight 228 lb 14.4 oz (103.828 kg).  LABORATORY DATA: Lab Results  Component Value Date   WBC 48.9* 03/18/2011   HGB 16.6 03/18/2011   HCT 49.1 03/18/2011   MCV 94.3 03/18/2011   PLT 108* 03/18/2011      Chemistry  Component Value Date/Time   NA 142 03/18/2011 1035   K 4.1 03/18/2011 1035   CL 111 03/18/2011 1035   CO2 23 03/18/2011 1035   BUN 22 03/18/2011 1035   CREATININE 1.02 03/18/2011 1035      Component Value Date/Time   CALCIUM 9.6 03/18/2011 1035   ALKPHOS 68 03/18/2011 1035   AST 13 03/18/2011 1035   ALT 15 03/18/2011 1035   BILITOT 0.4 03/18/2011 1035       RADIOGRAPHIC STUDIES:  No results found.  ASSESSMENT: 61 year old gentleman with  #1 stage 0 CLL no evidence of progressive disease.  #2 polycythemia  resolved   PLAN:   #1 patient will continue to be seen on a regular basis. I will plan on seeing him back in July 2013.  #2 I will also get monthly CBCs. Today of note patient's platelet count is 108,000. Wears in December 2012 it was normal at 142. White count has come down very nicely at 48.9. We will continue to observe him.   All questions were answered. The patient knows to call the clinic with any problems, questions or concerns. We can certainly see the patient much sooner if necessary.  I spent 20 minutes counseling the patient face to face. The total time spent in the appointment was 30 minutes.    Drue Second, MD Medical/Oncology [facility] [phone] (beeper) [phone] (Office)  03/20/2011, 5:44 PM

## 2011-04-01 ENCOUNTER — Encounter: Payer: Self-pay | Admitting: Surgery

## 2011-04-04 ENCOUNTER — Ambulatory Visit (INDEPENDENT_AMBULATORY_CARE_PROVIDER_SITE_OTHER): Payer: BC Managed Care – PPO | Admitting: Surgery

## 2011-04-04 ENCOUNTER — Encounter (INDEPENDENT_AMBULATORY_CARE_PROVIDER_SITE_OTHER): Payer: BC Managed Care – PPO | Admitting: Vascular Surgery

## 2011-04-04 ENCOUNTER — Encounter: Payer: Self-pay | Admitting: Surgery

## 2011-04-04 VITALS — BP 128/68 | HR 83 | Resp 20 | Ht 72.0 in | Wt 228.0 lb

## 2011-04-04 DIAGNOSIS — Z48812 Encounter for surgical aftercare following surgery on the circulatory system: Secondary | ICD-10-CM

## 2011-04-04 DIAGNOSIS — I739 Peripheral vascular disease, unspecified: Secondary | ICD-10-CM

## 2011-04-04 NOTE — Progress Notes (Signed)
[facility] [place]   Patient name: Ryan Copeland MRN: [id number] DOB: [birth year] Sex: male     Chief Complaint  Patient presents with  . PVD    6 month f/u  lab study today    HISTORY OF PRESENT ILLNESS: The patient is back today for followup of his left common iliac stent which has required balloon angioplasty for in-stent stenosis. He is therefore patient Dr. Madilyn Fireman. Dr. Madilyn Fireman did his last intervention in June of 2010 he used a 8 x 20 Powerflex balloon followed by a 10 x 40 Powerflex balloon. The patient had a good result since that time. He denies having symptoms of claudication. He did drop a piece of sheet rock on his left great toe. He said that it bled a lot and has been healing however it has not completely healed since his injury which was 2 weeks ago. He has no other complaints.  Past Medical History  Diagnosis Date  . Cancer     cll  . MI, acute, non ST segment elevation 06/28/2009  . Hyperlipidemia   . Tobacco abuse   . PVD (peripheral vascular disease)   . CLL (chronic lymphocytic leukemia) 03/18/2011    Past Surgical History  Procedure Date  . Femoral stents     History   Social History  . Marital Status: Married    Spouse Name: N/A    Number of Children: N/A  . Years of Education: N/A   Occupational History  . Not on file.   Social History Main Topics  . Smoking status: Current Everyday Smoker -- 0.2 packs/day for 43 years    Types: Cigarettes  . Smokeless tobacco: Never Used  . Alcohol Use: No  . Drug Use: No  . Sexually Active: Not on file   Other Topics Concern  . Not on file   Social History Narrative  . No narrative on file    Family History  Problem Relation Age of Onset  . Lung cancer Mother 19  . Cancer Mother     lung  . Heart failure Father 64  . Heart disease Father     Allergies as of 04/04/2011 - Review Complete 04/04/2011  Allergen Reaction Noted  . Codeine Hives 09/27/2008    Current Outpatient  Prescriptions on File Prior to Visit  Medication Sig Dispense Refill  . aspirin 325 MG tablet Take 325 mg by mouth daily.        Marland Kitchen esomeprazole (NEXIUM) 40 MG capsule Take 40 mg by mouth daily before breakfast.        . metoprolol (TOPROL XL) 50 MG 24 hr tablet Take 1 tablet (50 mg total) by mouth daily.  30 tablet  11  . nitroGLYCERIN (NITROSTAT) 0.4 MG SL tablet Place 1 tablet (0.4 mg total) under the tongue every 5 (five) minutes as needed.  25 tablet  12  . prasugrel (EFFIENT) 10 MG TABS Take 1 tablet (10 mg total) by mouth daily.  30 tablet  11  . Pregabalin (LYRICA PO) Take 75 mg by mouth. Take 1 tablet three daily,and take 2 tablets at bedtime.      . rosuvastatin (CRESTOR) 10 MG tablet Take 1 tablet (10 mg total) by mouth daily.  30 tablet  11  . topiramate (TOPAMAX) 25 MG capsule Take 25 mg by mouth 2 (two) times daily.         REVIEW OF SYSTEMS: Positive for dizziness. All other review of systems are negative as  documented in the counter form  PHYSICAL EXAMINATION:   Vital signs are BP 128/68  Pulse 83  Resp 20  Ht 6' (1.829 m)  Wt 228 lb (103.42 kg)  BMI 30.92 kg/m2 General: The patient appears their stated age. HEENT:  No gross abnormalities Pulmonary:  Non labored breathing Abdomen: Soft and non-tender Musculoskeletal: There are no major deformities. Neurologic: No focal weakness or paresthesias are detected, Skin: There are no ulcer or rashes noted. Skin separation at the base of the nail secondary to recent trauma Psychiatric: The patient has normal affect. Cardiovascular: There is a regular rate and rhythm without significant murmur appreciated. Palpable left femoral pulse. Left pedal pulses are not palpable   Diagnostic Studies Duplex ultrasound today showed elevated velocities within the left common iliac artery. Maximal velocity was 324 cm/s. His ankle brachial indices are unchanged. 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 Madilyn Fireman in 2008. A 9 x 28 balloon expandable stent was placed. This was complicated by lower extremity embolization requiring tibial embolectomy. He has done well since that time he did require repeat balloon angioplasty of in-stent stenosis in June of 2010 using a 10 mm balloon. He is found today to have elevated velocities within his stent and I recommend repeat balloon angioplasty to address his in-stent stenosis. We also discussed the possibility of placing a covered stent should this not respond to balloon angioplasty. I'll plan on accessing the left groin. The patient is try and coordinate this with his work schedule I will contact me when he has the availability. This may need to be done on a Friday afternoon to accommodate his work schedule.  Jorge Ny, M.D. [facility] [place] Office: [phone] Pager:  [phone]

## 2011-04-11 NOTE — Procedures (Unsigned)
AORTA-ILIAC DUPLEX EVALUATION  INDICATION:  Peripheral vascular disease  HISTORY: Diabetes:  Borderline Cardiac:  No Hypertension:  Yes Smoking:  Currently Previous Surgery:  Left common iliac artery and popliteal/tibial thrombectomy on 08/16/2006; left common iliac artery percutaneous transluminal angioplasty on 08/06/2008              SINGLE LEVEL ARTERIAL EXAM                             RIGHT                  LEFT Brachial: Anterior tibial: Posterior tibial: Peroneal: Ankle/brachial index:      115                    112 Previous ABI/date:  09/06/2010                    1.07 1.15  AORTA-ILIAC DUPLEX EXAM Aorta - Proximal     101 cm/s Aorta - Mid          88 cm/s Aorta - Distal       101 cm/s  RIGHT                                   LEFT 204 cm/s          CIA-PROXIMAL          324/303 cm/s 126 cm/s          CIA-DISTAL            79/108 cm/s 205 cm/s          HYPOGASTRIC           136 cm/s 138 cm/s          EIA-PROXIMAL          110 cm/s 146 cm/s          EIA-MID               137 cm/s 126 cm/s          EIA-DISTAL            122 cm/s  IMPRESSION: 1. Patent abdominal aorta without hemodynamically significant plaque     present. 2. Elevated velocity suggestive of 50%-75% stenosis involving the left     common iliac artery proximal, right common iliac artery proximal,     right internal iliac artery. 3. Stable ankle brachial indices present since previous study on     09/06/2010.     ___________________________________________ V. Charlena Cross, MD  SH/MEDQ  D:  04/04/2011  T:  04/04/2011  Job:  [id number]

## 2011-04-15 ENCOUNTER — Other Ambulatory Visit (HOSPITAL_BASED_OUTPATIENT_CLINIC_OR_DEPARTMENT_OTHER): Payer: BC Managed Care – PPO | Admitting: Lab

## 2011-04-15 ENCOUNTER — Other Ambulatory Visit: Payer: Self-pay | Admitting: Lab

## 2011-04-15 DIAGNOSIS — C9110 Chronic lymphocytic leukemia of B-cell type not having achieved remission: Secondary | ICD-10-CM

## 2011-04-15 LAB — CBC WITH DIFFERENTIAL/PLATELET
BASO%: 2.9 % — ABNORMAL HIGH (ref 0.0–2.0)
EOS%: 0 % (ref 0.0–7.0)
LYMPH%: 86.3 % — ABNORMAL HIGH (ref 14.0–49.0)
MCH: 31.5 pg (ref 27.2–33.4)
MCHC: 33 g/dL (ref 32.0–36.0)
MCV: 95.4 fL (ref 79.3–98.0)
MONO#: 1.6 10*3/uL — ABNORMAL HIGH (ref 0.1–0.9)
MONO%: 2.4 % (ref 0.0–14.0)
Platelets: 107 10*3/uL — ABNORMAL LOW (ref 140–400)
RBC: 5.05 10*6/uL (ref 4.20–5.82)
WBC: 67.1 10*3/uL (ref 4.0–10.3)

## 2011-04-15 LAB — COMPREHENSIVE METABOLIC PANEL
ALT: 18 U/L (ref 0–53)
AST: 16 U/L (ref 0–37)
Alkaline Phosphatase: 64 U/L (ref 39–117)
Sodium: 140 mEq/L (ref 135–145)
Total Bilirubin: 0.3 mg/dL (ref 0.3–1.2)
Total Protein: 5.9 g/dL — ABNORMAL LOW (ref 6.0–8.3)

## 2011-04-18 ENCOUNTER — Telehealth: Payer: Self-pay | Admitting: *Deleted

## 2011-04-18 NOTE — Telephone Encounter (Signed)
Message copied by Cooper Render on Mon Apr 18, 2011 10:10 AM ------      Message from: Ryan Copeland      Created: Fri Apr 15, 2011  3:25 PM       Call patient: labs show white count up again but platelts stable as last time. Continue to monitor for now.

## 2011-04-18 NOTE — Telephone Encounter (Signed)
Per MD notified pt: labs show white count up again but platelts stable as last time. Continue to monitor for now

## 2011-05-13 ENCOUNTER — Other Ambulatory Visit: Payer: Self-pay | Admitting: Lab

## 2011-05-16 ENCOUNTER — Ambulatory Visit (HOSPITAL_BASED_OUTPATIENT_CLINIC_OR_DEPARTMENT_OTHER): Payer: BC Managed Care – PPO | Admitting: Lab

## 2011-05-16 ENCOUNTER — Other Ambulatory Visit: Payer: Self-pay | Admitting: Lab

## 2011-05-16 ENCOUNTER — Telehealth: Payer: Self-pay | Admitting: *Deleted

## 2011-05-16 DIAGNOSIS — C9110 Chronic lymphocytic leukemia of B-cell type not having achieved remission: Secondary | ICD-10-CM

## 2011-05-16 LAB — CBC WITH DIFFERENTIAL/PLATELET
Basophils Absolute: 0.2 10*3/uL — ABNORMAL HIGH (ref 0.0–0.1)
EOS%: 0.2 % (ref 0.0–7.0)
Eosinophils Absolute: 0.1 10*3/uL (ref 0.0–0.5)
HCT: 49.2 % (ref 38.4–49.9)
HGB: 16.2 g/dL (ref 13.0–17.1)
MCH: 32 pg (ref 27.2–33.4)
MCV: 96.9 fL (ref 79.3–98.0)
MONO%: 1.6 % (ref 0.0–14.0)
NEUT#: 6.6 10*3/uL — ABNORMAL HIGH (ref 1.5–6.5)
NEUT%: 7.5 % — ABNORMAL LOW (ref 39.0–75.0)
lymph#: 78.7 10*3/uL — ABNORMAL HIGH (ref 0.9–3.3)

## 2011-05-16 NOTE — Telephone Encounter (Signed)
patient came in for lab only 05-16-2011 at 12:15pm also gave patient appointment for 06-15-2011 07-20-2011 08-25-2011 printed out caledar and gave to the patiient

## 2011-05-24 ENCOUNTER — Telehealth: Payer: Self-pay | Admitting: *Deleted

## 2011-05-24 NOTE — Telephone Encounter (Signed)
Per MD- called pt lmovm - pt white count is slightly up but platelets better.  Pt to keep checking CBC every Month. Pt to call back to confirm message has been rcvd.

## 2011-05-24 NOTE — Telephone Encounter (Signed)
Message copied by Cooper Render on Tue May 24, 2011  5:22 PM ------      Message from: Victorino December      Created: Tue May 24, 2011  4:05 PM       Call patient: white count slightly up but platelets better. Keep checking cbc q monthly

## 2011-05-25 NOTE — Telephone Encounter (Signed)
Pt called. Message rcvd.

## 2011-06-10 ENCOUNTER — Other Ambulatory Visit: Payer: Self-pay | Admitting: Lab

## 2011-06-15 ENCOUNTER — Other Ambulatory Visit: Payer: Self-pay | Admitting: Lab

## 2011-06-15 ENCOUNTER — Other Ambulatory Visit (HOSPITAL_BASED_OUTPATIENT_CLINIC_OR_DEPARTMENT_OTHER): Payer: BC Managed Care – PPO

## 2011-06-15 DIAGNOSIS — C9110 Chronic lymphocytic leukemia of B-cell type not having achieved remission: Secondary | ICD-10-CM

## 2011-06-15 LAB — CBC WITH DIFFERENTIAL/PLATELET
Eosinophils Absolute: 0.1 10*3/uL (ref 0.0–0.5)
HCT: 47.7 % (ref 38.4–49.9)
LYMPH%: 89.1 % — ABNORMAL HIGH (ref 14.0–49.0)
MCV: 98.6 fL — ABNORMAL HIGH (ref 79.3–98.0)
MONO#: 1.2 10*3/uL — ABNORMAL HIGH (ref 0.1–0.9)
MONO%: 2.1 % (ref 0.0–14.0)
NEUT#: 4.8 10*3/uL (ref 1.5–6.5)
NEUT%: 8.3 % — ABNORMAL LOW (ref 39.0–75.0)
Platelets: 93 10*3/uL — ABNORMAL LOW (ref 140–400)
RBC: 4.84 10*6/uL (ref 4.20–5.82)
nRBC: 0 % (ref 0–0)

## 2011-06-16 ENCOUNTER — Telehealth: Payer: Self-pay | Admitting: Medical Oncology

## 2011-06-16 NOTE — Telephone Encounter (Signed)
LMOVM, per Dr. Welton Flakes lab work looks OK.  Instructed patient to call with any questions.

## 2011-06-30 ENCOUNTER — Other Ambulatory Visit: Payer: Self-pay | Admitting: Cardiology

## 2011-07-08 ENCOUNTER — Other Ambulatory Visit: Payer: Self-pay | Admitting: Lab

## 2011-07-08 ENCOUNTER — Telehealth: Payer: Self-pay | Admitting: Nurse Practitioner

## 2011-07-08 NOTE — Telephone Encounter (Signed)
Patient called was told received phone call from [facility] requesting permission to do genetic testing for anti platelet medication to see if qualifies for generic.Patient was told needs appointment last office visit 06/21/10.Appointment scheduled with Ryan Fredrickson NP 07/29/11

## 2011-07-08 NOTE — Telephone Encounter (Signed)
[employer] checking on status of fax sent 5-20

## 2011-07-18 ENCOUNTER — Other Ambulatory Visit: Payer: BC Managed Care – PPO | Admitting: Lab

## 2011-07-18 ENCOUNTER — Other Ambulatory Visit: Payer: Self-pay | Admitting: Lab

## 2011-07-19 ENCOUNTER — Other Ambulatory Visit (HOSPITAL_BASED_OUTPATIENT_CLINIC_OR_DEPARTMENT_OTHER): Payer: BC Managed Care – PPO

## 2011-07-19 DIAGNOSIS — C9110 Chronic lymphocytic leukemia of B-cell type not having achieved remission: Secondary | ICD-10-CM

## 2011-07-19 LAB — CBC WITH DIFFERENTIAL/PLATELET
Basophils Absolute: 0.1 10*3/uL (ref 0.0–0.1)
EOS%: 0.1 % (ref 0.0–7.0)
Eosinophils Absolute: 0.1 10*3/uL (ref 0.0–0.5)
HCT: 50.2 % — ABNORMAL HIGH (ref 38.4–49.9)
HGB: 16.4 g/dL (ref 13.0–17.1)
LYMPH%: 92.2 % — ABNORMAL HIGH (ref 14.0–49.0)
MCH: 32.4 pg (ref 27.2–33.4)
MCV: 99.3 fL — ABNORMAL HIGH (ref 79.3–98.0)
MONO%: 2 % (ref 0.0–14.0)
NEUT#: 4.7 10*3/uL (ref 1.5–6.5)
NEUT%: 5.6 % — ABNORMAL LOW (ref 39.0–75.0)
Platelets: 96 10*3/uL — ABNORMAL LOW (ref 140–400)
RDW: 16 % — ABNORMAL HIGH (ref 11.0–14.6)

## 2011-07-20 ENCOUNTER — Other Ambulatory Visit: Payer: BC Managed Care – PPO | Admitting: Lab

## 2011-07-29 ENCOUNTER — Ambulatory Visit (INDEPENDENT_AMBULATORY_CARE_PROVIDER_SITE_OTHER): Payer: BC Managed Care – PPO | Admitting: Nurse Practitioner

## 2011-07-29 ENCOUNTER — Other Ambulatory Visit: Payer: Self-pay | Admitting: Nurse Practitioner

## 2011-07-29 ENCOUNTER — Ambulatory Visit
Admission: RE | Admit: 2011-07-29 | Discharge: 2011-07-29 | Disposition: A | Discharge status: Home / Self Care | Payer: BC Managed Care – PPO | Source: Ambulatory Visit | Attending: Nurse Practitioner | Admitting: Nurse Practitioner

## 2011-07-29 ENCOUNTER — Encounter: Payer: Self-pay | Admitting: Nurse Practitioner

## 2011-07-29 ENCOUNTER — Other Ambulatory Visit: Payer: Self-pay | Admitting: *Deleted

## 2011-07-29 VITALS — BP 130/66 | HR 68 | Ht 72.0 in | Wt 225.0 lb

## 2011-07-29 DIAGNOSIS — I739 Peripheral vascular disease, unspecified: Secondary | ICD-10-CM

## 2011-07-29 DIAGNOSIS — E785 Hyperlipidemia, unspecified: Secondary | ICD-10-CM

## 2011-07-29 DIAGNOSIS — I2510 Atherosclerotic heart disease of native coronary artery without angina pectoris: Secondary | ICD-10-CM

## 2011-07-29 DIAGNOSIS — Z01818 Encounter for other preprocedural examination: Secondary | ICD-10-CM

## 2011-07-29 MED ORDER — ESOMEPRAZOLE MAGNESIUM 40 MG PO CPDR: 40.0000 mg | DELAYED_RELEASE_CAPSULE | Freq: Two times a day (BID) | ORAL | Status: DC

## 2011-07-29 MED ORDER — PRASUGREL HCL 10 MG PO TABS: 10.0000 mg | ORAL_TABLET | Freq: Every day | ORAL | Status: DC

## 2011-07-29 MED ORDER — ESOMEPRAZOLE MAGNESIUM 40 MG PO CPDR: 40.0000 mg | DELAYED_RELEASE_CAPSULE | Freq: Every day | ORAL | Status: DC

## 2011-07-29 MED ORDER — NITROGLYCERIN 0.4 MG SL SUBL: 0.4000 mg | SUBLINGUAL_TABLET | SUBLINGUAL | Status: DC | PRN

## 2011-07-29 MED ORDER — ROSUVASTATIN CALCIUM 10 MG PO TABS: 10.0000 mg | ORAL_TABLET | Freq: Every day | ORAL | Status: DC

## 2011-07-29 MED ORDER — METOPROLOL SUCCINATE ER 50 MG PO TB24: 50.0000 mg | ORAL_TABLET | Freq: Every day | ORAL | Status: DC

## 2011-07-29 NOTE — Assessment & Plan Note (Addendum)
Patient presents with exertional chest discomfort that is responsive to sl NTG. Cardiac catheterization is recommended. Patient is not willing to proceed on with this until July 12th with Dr. Swaziland. He does have NTG on hand. Will check fasting labs one week prior. He is reminded to use his NTG and if he has any changes in symptoms he is to go to the ER. Smoking cessation is encouraged once again. The procedure has been reviewed in full detail and he is willing to proceed.  Patient is agreeable to this plan and will call if any problems develop in the interim.

## 2011-07-29 NOTE — Progress Notes (Signed)
 Ryan Copeland Date of Birth: 12/21/1950 Medical Record #[id number]  History of Present Illness: Ryan Copeland is seen today for a work in visit. He is seen for Dr. Nahser. He is a former patient of Dr. Tennant's. He has known CAD with prior NSTEMI and stenting of the LAD in 2011. He has ongoing tobacco abuse, HTN and HLD. He also sees Dr. Kahn for CLL and is currently only in observation.   He comes in today. He is here alone. He had been doing fairly well up until a couple of months ago. He has began having exertional chest pain that requires 1 sl NTG with prompt relief. He has never had to use more than 1 tablet. It is always exertional. No other associated symptoms reported. He has been taking his medicines. No recent labs. He is still smoking. Has seen [facility] back in February and repeat PCI has been recommended due to increased velocities and presumed restenosis of his stent.   Current Outpatient Prescriptions on File Prior to Visit  Medication Sig Dispense Refill  . aspirin 325 MG tablet Take 325 mg by mouth daily.        . EFFIENT 10 MG TABS TAKE 1 TABLET (10 MG TOTAL) BY MOUTH DAILY.  30 tablet  0  . esomeprazole (NEXIUM) 40 MG capsule Take 40 mg by mouth daily before breakfast.        . metoprolol succinate (TOPROL-XL) 50 MG 24 hr tablet TAKE 1 TABLET (50 MG TOTAL) BY MOUTH DAILY.  30 tablet  0  . nitroGLYCERIN (NITROSTAT) 0.4 MG SL tablet Place 1 tablet (0.4 mg total) under the tongue every 5 (five) minutes as needed.  25 tablet  12  . Pregabalin (LYRICA PO) Take 75 mg by mouth. Take 1 tablet three daily,and take 2 tablets at bedtime.      . rosuvastatin (CRESTOR) 10 MG tablet Take 1 tablet (10 mg total) by mouth daily.  30 tablet  11    Allergies  Allergen Reactions  . Codeine Hives    Pt states he can take a few, more reaction with extended doses.    Past Medical History  Diagnosis Date  . 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 Hyperlipidemia   .  Tobacco abuse   . PVD (peripheral vascular disease)   . CLL (chronic lymphocytic leukemia) 03/18/2011    Past Surgical History  Procedure Date  . Femoral stents   . Coronary stent placement May 2011    History  Smoking status  . Current Everyday Smoker -- 0.5 packs/day for 43 years  . Types: Cigarettes  Smokeless tobacco  . Never Used    History  Alcohol Use  . Yes    occasional    Family History  Problem Relation Age of Onset  . Lung cancer Mother 63  . Cancer Mother     lung  . Heart failure Father 74  . Heart disease Father     Review of Systems: The review of systems is positive for exertional chest pain.  All other systems were reviewed and are negative.  Physical Exam: BP 130/66  Pulse 68  Ht 6' (1.829 m)  Wt 225 lb (102.059 kg)  BMI 30.52 kg/m2 Patient is very pleasant and in no acute distress. Skin is warm and dry. Color is normal.  HEENT is unremarkable. Normocephalic/atraumatic. PERRL. Sclera are nonicteric. Neck is supple. No masses. No JVD. Lungs are clear. Cardiac   exam shows a regular rate and rhythm. Abdomen is soft. Extremities are without edema. Gait and ROM are intact. No gross neurologic deficits noted.   LABORATORY DATA: EKG today shows sinus rhythm. No acute changes.    Lab Results  Component Value Date   WBC 82.8* 07/19/2011   HGB 16.4 07/19/2011   HCT 50.2* 07/19/2011   PLT 96* 07/19/2011   GLUCOSE 168* 04/15/2011   CHOL  Value: 202        ATP III CLASSIFICATION:  <200     mg/dL   Desirable  200-239  mg/dL   Borderline High  >=240    mg/dL   High       * 06/28/2009   TRIG 217* 06/28/2009   HDL 32* 06/28/2009   LDLCALC  Value: 127        Total Cholesterol/HDL:CHD Risk Coronary Heart Disease Risk Table                     Men   Women  1/2 Average Risk   3.4   3.3  Average Risk       5.0   4.4  2 X Average Risk   9.6   7.1  3 X Average Risk  23.4   11.0        Use the calculated Patient Ratio above and the CHD Risk Table to determine the patient's CHD  Risk.        ATP III CLASSIFICATION (LDL):  <100     mg/dL   Optimal  100-129  mg/dL   Near or Above                    Optimal  130-159  mg/dL   Borderline  160-189  mg/dL   High  >190     mg/dL   Very High* 06/28/2009   ALT 18 04/15/2011   AST 16 04/15/2011   NA 140 04/15/2011   K 4.6 04/15/2011   CL 111 04/15/2011   CREATININE 0.99 04/15/2011   BUN 17 04/15/2011   CO2 18* 04/15/2011   INR 1.02 06/28/2009   HGBA1C  Value: 6.3 (NOTE)                                                                       According to the ADA Clinical Practice Recommendations for 2011, when HbA1c is used as a screening test:   >=6.5%   Diagnostic of Diabetes Mellitus           (if abnormal result  is confirmed)  5.7-6.4%   Increased risk of developing Diabetes Mellitus  References:Diagnosis and Classification of Diabetes Mellitus,Diabetes Care,2011,34(Suppl 1):S62-S69 and Standards of Medical Care in         Diabetes - 2011,Diabetes Care,2011,34  (Suppl 1):S11-S61.* 06/28/2009     Assessment / Plan:  

## 2011-07-29 NOTE — Patient Instructions (Addendum)
I have refilled your medicines today.   We are going to arrange for a heart catheterization with Dr. Swaziland for Friday, July 12th  Use your NTG under your tongue for recurrent chest pain. May take one tablet every 5 minutes. If you are still having discomfort after 3 tablets in 15 minutes, call 911.  You are scheduled for a cardiac catheterization on Friday, July 12th with Dr. Swaziland or associate.  Go to [facility] 2nd Floor Short Stay on Friday, July 12th at [facility]. Your procedure is scheduled for 9 AM. No food or drink after midnight on Thursday. You may take your medications with a sip of water on the day of your procedure.    We will need to check fasting labs one week prior to this procedure.   Go to [facility] today at the [facility] for your chest x ray today  Call the [facility] office at [phone] if you have any questions, problems or concerns.

## 2011-07-29 NOTE — Assessment & Plan Note (Signed)
Has planned follow up with [facility] later this summer. Has been advised to have repeat PCI of his iliac stent due to increased velocity and presumed to have restenosis.

## 2011-08-05 ENCOUNTER — Other Ambulatory Visit: Payer: Self-pay | Admitting: Lab

## 2011-08-10 ENCOUNTER — Other Ambulatory Visit: Payer: Self-pay | Admitting: Nurse Practitioner

## 2011-08-10 DIAGNOSIS — I2510 Atherosclerotic heart disease of native coronary artery without angina pectoris: Secondary | ICD-10-CM

## 2011-08-10 DIAGNOSIS — E785 Hyperlipidemia, unspecified: Secondary | ICD-10-CM

## 2011-08-10 MED ORDER — ESOMEPRAZOLE MAGNESIUM 40 MG PO CPDR: 40.0000 mg | DELAYED_RELEASE_CAPSULE | Freq: Two times a day (BID) | ORAL | Status: DC

## 2011-08-10 NOTE — Telephone Encounter (Signed)
Pt said original rx was for one time per day and it should be two times per day please resend

## 2011-08-11 ENCOUNTER — Encounter: Payer: Self-pay | Admitting: *Deleted

## 2011-08-11 ENCOUNTER — Other Ambulatory Visit: Payer: Self-pay | Admitting: *Deleted

## 2011-08-11 DIAGNOSIS — E785 Hyperlipidemia, unspecified: Secondary | ICD-10-CM

## 2011-08-11 DIAGNOSIS — I2510 Atherosclerotic heart disease of native coronary artery without angina pectoris: Secondary | ICD-10-CM

## 2011-08-11 MED ORDER — ESOMEPRAZOLE MAGNESIUM 40 MG PO CPDR: 40.0000 mg | DELAYED_RELEASE_CAPSULE | Freq: Two times a day (BID) | ORAL | Status: DC

## 2011-08-11 NOTE — Patient Instructions (Signed)
Received fax from pharmacy for prior authorization on patients Crestor.  Called BC/BS, opened case and am awaiting a fax form to fill out to get authorized.  Vista Mink, CMA

## 2011-08-15 ENCOUNTER — Encounter (HOSPITAL_COMMUNITY): Payer: Self-pay | Admitting: Pharmacy Technician

## 2011-08-22 ENCOUNTER — Other Ambulatory Visit (INDEPENDENT_AMBULATORY_CARE_PROVIDER_SITE_OTHER): Payer: BC Managed Care – PPO

## 2011-08-22 DIAGNOSIS — E785 Hyperlipidemia, unspecified: Secondary | ICD-10-CM

## 2011-08-22 DIAGNOSIS — I2510 Atherosclerotic heart disease of native coronary artery without angina pectoris: Secondary | ICD-10-CM

## 2011-08-22 LAB — CBC WITH DIFFERENTIAL/PLATELET
Basophils Absolute: 0.1 10*3/uL (ref 0.0–0.1)
Basophils Relative: 0.1 % (ref 0.0–3.0)
Eosinophils Absolute: 0 10*3/uL (ref 0.0–0.7)
Eosinophils Relative: 0 % (ref 0.0–5.0)
HCT: 50 % (ref 39.0–52.0)
Hemoglobin: 16.1 g/dL (ref 13.0–17.0)
Lymphocytes Relative: 85.7 % — ABNORMAL HIGH (ref 12.0–46.0)
Lymphs Abs: 73.2 10*3/uL — ABNORMAL HIGH (ref 0.7–4.0)
MCHC: 32.3 g/dL (ref 30.0–36.0)
MCV: 100.2 fl — ABNORMAL HIGH (ref 78.0–100.0)
Monocytes Absolute: 6.6 10*3/uL — ABNORMAL HIGH (ref 0.1–1.0)
Monocytes Relative: 7.7 % (ref 3.0–12.0)
Neutro Abs: 5.6 10*3/uL (ref 1.4–7.7)
Neutrophils Relative %: 6.5 % — ABNORMAL LOW (ref 43.0–77.0)
Platelets: 94 10*3/uL — ABNORMAL LOW (ref 150.0–400.0)
RBC: 4.99 Mil/uL (ref 4.22–5.81)
RDW: 16.4 % — ABNORMAL HIGH (ref 11.5–14.6)
WBC: 85.5 10*3/uL (ref 4.5–10.5)

## 2011-08-22 LAB — BASIC METABOLIC PANEL
BUN: 20 mg/dL (ref 6–23)
CO2: 25 mEq/L (ref 19–32)
Calcium: 9.2 mg/dL (ref 8.4–10.5)
Chloride: 106 mEq/L (ref 96–112)
Creatinine, Ser: 1.3 mg/dL (ref 0.4–1.5)
GFR: 61.42 mL/min (ref >60.00)
Glucose, Bld: 154 mg/dL — ABNORMAL HIGH (ref 70–99)
Potassium: 5 mEq/L (ref 3.5–5.1)
Sodium: 139 mEq/L (ref 135–145)

## 2011-08-22 LAB — HEPATIC FUNCTION PANEL
ALT: 27 U/L (ref 0–53)
AST: 19 U/L (ref 0–37)
Albumin: 4.1 g/dL (ref 3.5–5.2)
Alkaline Phosphatase: 65 U/L (ref 39–117)
Bilirubin, Direct: 0 mg/dL (ref 0.0–0.3)
Total Bilirubin: 0.5 mg/dL (ref 0.3–1.2)
Total Protein: 6.6 g/dL (ref 6.0–8.3)

## 2011-08-22 LAB — LIPID PANEL
Cholesterol: 215 mg/dL — ABNORMAL HIGH (ref 0–200)
HDL: 31.7 mg/dL — ABNORMAL LOW (ref >39.00)
Total CHOL/HDL Ratio: 7
Triglycerides: 289 mg/dL — ABNORMAL HIGH (ref 0.0–149.0)
VLDL: 57.8 mg/dL — ABNORMAL HIGH (ref 0.0–40.0)

## 2011-08-22 LAB — PROTIME-INR
INR: 1 ratio (ref 0.8–1.0)
Prothrombin Time: 10.7 s (ref 10.2–12.4)

## 2011-08-22 LAB — APTT: aPTT: 32.1 s — ABNORMAL HIGH (ref 21.7–28.8)

## 2011-08-22 LAB — LDL CHOLESTEROL, DIRECT: Direct LDL: 117.6 mg/dL

## 2011-08-24 ENCOUNTER — Telehealth: Payer: Self-pay | Admitting: Nurse Practitioner

## 2011-08-24 ENCOUNTER — Encounter: Payer: Self-pay | Admitting: *Deleted

## 2011-08-24 NOTE — Telephone Encounter (Signed)
New msg Pt wants to talk to you about procedure he has scheduled on Friday

## 2011-08-24 NOTE — Telephone Encounter (Signed)
Staff msg sent to Dr Swaziland to update him with pt concerns re Back pain and to not discuss DX: CLL .

## 2011-08-25 ENCOUNTER — Other Ambulatory Visit (HOSPITAL_BASED_OUTPATIENT_CLINIC_OR_DEPARTMENT_OTHER): Payer: BC Managed Care – PPO

## 2011-08-25 ENCOUNTER — Ambulatory Visit (HOSPITAL_BASED_OUTPATIENT_CLINIC_OR_DEPARTMENT_OTHER): Payer: BC Managed Care – PPO | Admitting: Oncology

## 2011-08-25 ENCOUNTER — Telehealth: Payer: Self-pay | Admitting: *Deleted

## 2011-08-25 ENCOUNTER — Encounter: Payer: Self-pay | Admitting: Oncology

## 2011-08-25 VITALS — BP 116/71 | HR 67 | Temp 98.3°F | Ht 72.0 in | Wt 229.2 lb

## 2011-08-25 DIAGNOSIS — C9110 Chronic lymphocytic leukemia of B-cell type not having achieved remission: Secondary | ICD-10-CM

## 2011-08-25 LAB — CBC WITH DIFFERENTIAL/PLATELET
BASO%: 0.2 % (ref 0.0–2.0)
EOS%: 0.1 % (ref 0.0–7.0)
HCT: 49 % (ref 38.4–49.9)
LYMPH%: 92.3 % — ABNORMAL HIGH (ref 14.0–49.0)
MCH: 32.4 pg (ref 27.2–33.4)
MCHC: 32.4 g/dL (ref 32.0–36.0)
MCV: 99.8 fL — ABNORMAL HIGH (ref 79.3–98.0)
NEUT%: 5.6 % — ABNORMAL LOW (ref 39.0–75.0)
Platelets: 88 10*3/uL — ABNORMAL LOW (ref 140–400)

## 2011-08-25 NOTE — Patient Instructions (Addendum)
1. Continue to follow for now, no treatment reommended  2, Follow cbc monthly  3. Ultrasound of spleen to see if platelets are being sequestered by a large spleen  4. I will see you back in November 2013

## 2011-08-25 NOTE — Telephone Encounter (Signed)
Gave patient appointment for monthly lab appointments per patient did not want to do the ultrasound on 08-25-2011 he requested to do the ultrasound on 09-22-2011 at 11:00am printed out calendar and gave to the patient

## 2011-08-25 NOTE — Progress Notes (Signed)
OFFICE PROGRESS NOTE  CC  [name], PA [address] [place] [address]  DIAGNOSIS: 61 year old gentleman with  #1 stage 0 CLL.  #2 polycythemia likely secondary now resolved.  PRIOR THERAPY: Observation  CURRENT THERAPY: Observation  INTERVAL HISTORY: Ryan Copeland 61 y.o. male returns for followup visit today. From his CLL perspective patient remains pretty stable his platelets are slightly low 88,000 he is without any bleeding problems. White count is up slightly to 85,000 but he is asymptomatic he has not had any fevers chills night sweats no lymphadenopathy no recurrent infections no pneumonia is sinus infections. Patient has had some chest pain and he is noted to have cardiovascular disease. Apparently he is going to have a cardiac catheterization performed tomorrow. Patient does continue to smoke in spite of recommendations to abstain. He is not in a regular exercise program but does tell me that he does a lot of walking for his job. Remainder of the 10 point review of systems is unremarkable MEDICAL HISTORY: Past Medical History  Diagnosis Date  . 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 Hyperlipidemia   . Tobacco abuse   . PVD (peripheral vascular disease)   . 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  MEDICATIONS:  Current Outpatient Prescriptions  Medication Sig Dispense Refill  . aspirin 325 MG tablet Take 325 mg by mouth daily.        Marland Kitchen esomeprazole (NEXIUM) 40 MG capsule Take 1 capsule (40 mg total) by mouth 2 (two) times daily.  180 capsule  3  . metoprolol succinate (TOPROL-XL) 50 MG 24 hr tablet Take 1 tablet (50 mg total) by mouth daily. Take with or immediately following a meal.  90 tablet  3  . nitroGLYCERIN (NITROSTAT) 0.4 MG SL tablet Place 1 tablet (0.4 mg total) under the tongue every 5 (five) minutes as needed.  25 tablet  12  . prasugrel (EFFIENT) 10 MG TABS  Take 1 tablet (10 mg total) by mouth daily.  90 tablet  3  . pregabalin (LYRICA) 75 MG capsule Take 75-150 mg by mouth 4 (four) times daily. 1 cap three times daily and 2 caps at bedtime      . rosuvastatin (CRESTOR) 10 MG tablet Take 1 tablet (10 mg total) by mouth daily.  90 tablet  3  . traMADol (ULTRAM) 50 MG tablet Take 50 mg by mouth every 6 (six) hours as needed. For pain        SURGICAL HISTORY:  Past Surgical History  Procedure Date  . Femoral stents   . Coronary stent placement May 2011    REVIEW OF SYSTEMS:  Pertinent items are noted in HPI.   PHYSICAL EXAMINATION:   HEENT exam: EOMI PERRLA sclerae anicteric no conjunctival pallor oral mucosa is moist neck is supple shotty lymphadenopathy is noted in the cervical supraclavicular and axillary regions. Lungs: Clear to auscultation and percussion cardiovascular: Regular rate rhythm no murmurs gallops or rubs abdomen: Protuberant nontender nondistended spleen tip is palpable no hepatomegaly extremities: No edema clubbing or cyanosis neuro: Patient is alert oriented otherwise nonfocal.  ECOG PERFORMANCE STATUS: 0 - Asymptomatic  Blood pressure 116/71, pulse 67, temperature 98.3 F (36.8 C), temperature source Oral, height 6' (1.829 m), weight 229 lb 3.2 oz (103.964 kg).  LABORATORY DATA: Lab Results  Component Value Date   WBC 85.4* 08/25/2011   HGB 15.9 08/25/2011   HCT 49.0  08/25/2011   MCV 99.8* 08/25/2011   PLT 88* 08/25/2011      Chemistry      Component Value Date/Time   NA 139 08/22/2011 0904   K 5.0 08/22/2011 0904   CL 106 08/22/2011 0904   CO2 25 08/22/2011 0904   BUN 20 08/22/2011 0904   CREATININE 1.3 08/22/2011 0904      Component Value Date/Time   CALCIUM 9.2 08/22/2011 0904   ALKPHOS 65 08/22/2011 0904   AST 19 08/22/2011 0904   ALT 27 08/22/2011 0904   BILITOT 0.5 08/22/2011 0904       RADIOGRAPHIC STUDIES:  No results found.  ASSESSMENT: 61 year old gentleman with  #1 stage 0 CLL no evidence of progressive  disease.  #2 polycythemia resolved  #3 possibility of splenomegaly that could be causing sequestration a platelet   PLAN:   #1Continue to monitor his CBC on a monthly basis at this point.  #2 I will plan on obtaining a ultrasound of the abdomen to look at splenic size.  #3 I will see him back in 3 months time.  All questions were answered. The patient knows to call the clinic with any problems, questions or concerns. We can certainly see the patient much sooner if necessary.  I spent 20 minutes counseling the patient face to face. The total time spent in the appointment was 30 minutes.    Ryan Second, MD Medical/Oncology [facility] [phone] (beeper) [phone] (Office)  08/25/2011, 12:29 PM

## 2011-08-26 ENCOUNTER — Ambulatory Visit (HOSPITAL_COMMUNITY)
Admission: RE | Admit: 2011-08-26 | Discharge: 2011-08-26 | Disposition: A | Discharge status: Home / Self Care | Payer: BC Managed Care – PPO | Source: Ambulatory Visit | Attending: Cardiology | Admitting: Cardiology

## 2011-08-26 ENCOUNTER — Encounter (HOSPITAL_COMMUNITY)
Admission: RE | Disposition: A | Discharge status: Home / Self Care | Payer: Self-pay | Source: Ambulatory Visit | Attending: Cardiology

## 2011-08-26 DIAGNOSIS — R079 Chest pain, unspecified: Secondary | ICD-10-CM | POA: Insufficient documentation

## 2011-08-26 DIAGNOSIS — I2510 Atherosclerotic heart disease of native coronary artery without angina pectoris: Secondary | ICD-10-CM | POA: Insufficient documentation

## 2011-08-26 DIAGNOSIS — C9110 Chronic lymphocytic leukemia of B-cell type not having achieved remission: Secondary | ICD-10-CM | POA: Insufficient documentation

## 2011-08-26 DIAGNOSIS — I10 Essential (primary) hypertension: Secondary | ICD-10-CM | POA: Insufficient documentation

## 2011-08-26 DIAGNOSIS — F17200 Nicotine dependence, unspecified, uncomplicated: Secondary | ICD-10-CM | POA: Insufficient documentation

## 2011-08-26 DIAGNOSIS — I252 Old myocardial infarction: Secondary | ICD-10-CM | POA: Insufficient documentation

## 2011-08-26 DIAGNOSIS — E785 Hyperlipidemia, unspecified: Secondary | ICD-10-CM | POA: Insufficient documentation

## 2011-08-26 DIAGNOSIS — I2582 Chronic total occlusion of coronary artery: Secondary | ICD-10-CM | POA: Insufficient documentation

## 2011-08-26 DIAGNOSIS — Z01818 Encounter for other preprocedural examination: Secondary | ICD-10-CM

## 2011-08-26 DIAGNOSIS — Z9861 Coronary angioplasty status: Secondary | ICD-10-CM | POA: Insufficient documentation

## 2011-08-26 HISTORY — PX: LEFT HEART CATHETERIZATION WITH CORONARY ANGIOGRAM: SHX5451

## 2011-08-26 LAB — HAPTOGLOBIN: Haptoglobin: 127 mg/dL (ref 45–215)

## 2011-08-26 SURGERY — LEFT HEART CATHETERIZATION WITH CORONARY ANGIOGRAM
Anesthesia: LOCAL

## 2011-08-26 MED ORDER — SODIUM CHLORIDE 0.9 % IV SOLN: 1.0000 mL/kg/h | INTRAVENOUS | Status: DC

## 2011-08-26 MED ORDER — NITROGLYCERIN 0.2 MG/ML ON CALL CATH LAB
INTRAVENOUS | Status: AC
  Filled 2011-08-26: qty 1

## 2011-08-26 MED ORDER — HEPARIN (PORCINE) IN NACL 2-0.9 UNIT/ML-% IJ SOLN
INTRAMUSCULAR | Status: AC
  Filled 2011-08-26: qty 2000

## 2011-08-26 MED ORDER — ASPIRIN 81 MG PO CHEW
324.0000 mg | CHEWABLE_TABLET | ORAL | Status: AC
  Administered 2011-08-26: 324 mg via ORAL
  Filled 2011-08-26: qty 4

## 2011-08-26 MED ORDER — VERAPAMIL HCL 2.5 MG/ML IV SOLN
INTRAVENOUS | Status: AC
  Filled 2011-08-26: qty 2

## 2011-08-26 MED ORDER — MIDAZOLAM HCL 2 MG/2ML IJ SOLN
INTRAMUSCULAR | Status: AC
  Filled 2011-08-26: qty 2

## 2011-08-26 MED ORDER — FENTANYL CITRATE 0.05 MG/ML IJ SOLN
INTRAMUSCULAR | Status: AC
  Filled 2011-08-26: qty 2

## 2011-08-26 MED ORDER — ONDANSETRON HCL 4 MG/2ML IJ SOLN: 4.0000 mg | Freq: Four times a day (QID) | INTRAMUSCULAR | Status: DC | PRN

## 2011-08-26 MED ORDER — MORPHINE SULFATE 10 MG/ML IJ SOLN: 2.0000 mg | INTRAMUSCULAR | Status: DC | PRN

## 2011-08-26 MED ORDER — DIAZEPAM 5 MG PO TABS
10.0000 mg | ORAL_TABLET | ORAL | Status: AC
  Administered 2011-08-26: 10 mg via ORAL
  Filled 2011-08-26: qty 2

## 2011-08-26 MED ORDER — SODIUM CHLORIDE 0.9 % IJ SOLN: 3.0000 mL | INTRAMUSCULAR | Status: DC | PRN

## 2011-08-26 MED ORDER — ACETAMINOPHEN 325 MG PO TABS: 650.0000 mg | ORAL_TABLET | ORAL | Status: DC | PRN

## 2011-08-26 MED ORDER — LIDOCAINE HCL (PF) 1 % IJ SOLN
INTRAMUSCULAR | Status: AC
  Filled 2011-08-26: qty 30

## 2011-08-26 MED ORDER — SODIUM CHLORIDE 0.9 % IV SOLN
INTRAVENOUS | Status: DC
  Administered 2011-08-26: 08:00:00 via INTRAVENOUS

## 2011-08-26 MED ORDER — SODIUM CHLORIDE 0.9 % IJ SOLN
3.0000 mL | Freq: Two times a day (BID) | INTRAMUSCULAR | Status: DC
  Administered 2011-08-26: 3 mL via INTRAVENOUS

## 2011-08-26 MED ORDER — SODIUM CHLORIDE 0.9 % IV SOLN: 250.0000 mL | INTRAVENOUS | Status: DC | PRN

## 2011-08-26 MED ORDER — MORPHINE SULFATE 4 MG/ML IJ SOLN
INTRAMUSCULAR | Status: AC
  Administered 2011-08-26: 2 mg via INTRAVENOUS
  Filled 2011-08-26: qty 1

## 2011-08-26 MED ORDER — HEPARIN SODIUM (PORCINE) 1000 UNIT/ML IJ SOLN
INTRAMUSCULAR | Status: AC
  Filled 2011-08-26: qty 1

## 2011-08-26 NOTE — H&P (View-Only) (Signed)
Merilynn Finland Date of Birth: 07-20-1950 Medical Record #[id number]  History of Present Illness: Ryan Copeland is seen today for a work in visit. He is seen for Dr. Elease Hashimoto. He is a former patient of Dr. Ronnald Nian. He has known CAD with prior NSTEMI and stenting of the LAD in 2011. He has ongoing tobacco abuse, HTN and HLD. He also sees Dr. Park Breed for CLL and is currently only in observation.   He comes in today. He is here alone. He had been doing fairly well up until a couple of months ago. He has began having exertional chest pain that requires 1 sl NTG with prompt relief. He has never had to use more than 1 tablet. It is always exertional. No other associated symptoms reported. He has been taking his medicines. No recent labs. He is still smoking. Has seen [facility] back in February and repeat PCI has been recommended due to increased velocities and presumed restenosis of his stent.   Current Outpatient Prescriptions on File Prior to Visit  Medication Sig Dispense Refill  . aspirin 325 MG tablet Take 325 mg by mouth daily.        Marland Kitchen EFFIENT 10 MG TABS TAKE 1 TABLET (10 MG TOTAL) BY MOUTH DAILY.  30 tablet  0  . esomeprazole (NEXIUM) 40 MG capsule Take 40 mg by mouth daily before breakfast.        . metoprolol succinate (TOPROL-XL) 50 MG 24 hr tablet TAKE 1 TABLET (50 MG TOTAL) BY MOUTH DAILY.  30 tablet  0  . nitroGLYCERIN (NITROSTAT) 0.4 MG SL tablet Place 1 tablet (0.4 mg total) under the tongue every 5 (five) minutes as needed.  25 tablet  12  . Pregabalin (LYRICA PO) Take 75 mg by mouth. Take 1 tablet three daily,and take 2 tablets at bedtime.      . rosuvastatin (CRESTOR) 10 MG tablet Take 1 tablet (10 mg total) by mouth daily.  30 tablet  11    Allergies  Allergen Reactions  . Codeine Hives    Pt states he can take a few, more reaction with extended doses.    Past Medical History  Diagnosis Date  . 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 Hyperlipidemia   .  Tobacco abuse   . PVD (peripheral vascular disease)   . CLL (chronic lymphocytic leukemia) 03/18/2011    Past Surgical History  Procedure Date  . Femoral stents   . Coronary stent placement May 2011    History  Smoking status  . Current Everyday Smoker -- 0.5 packs/day for 43 years  . Types: Cigarettes  Smokeless tobacco  . Never Used    History  Alcohol Use  . Yes    occasional    Family History  Problem Relation Age of Onset  . Lung cancer Mother 55  . Cancer Mother     lung  . Heart failure Father 75  . Heart disease Father     Review of Systems: The review of systems is positive for exertional chest pain.  All other systems were reviewed and are negative.  Physical Exam: BP 130/66  Pulse 68  Ht 6' (1.829 m)  Wt 225 lb (102.059 kg)  BMI 30.52 kg/m2 Patient is very pleasant and in no acute distress. Skin is warm and dry. Color is normal.  HEENT is unremarkable. Normocephalic/atraumatic. PERRL. Sclera are nonicteric. Neck is supple. No masses. No JVD. Lungs are clear. Cardiac  exam shows a regular rate and rhythm. Abdomen is soft. Extremities are without edema. Gait and ROM are intact. No gross neurologic deficits noted.   LABORATORY DATA: EKG today shows sinus rhythm. No acute changes.    Lab Results  Component Value Date   WBC 82.8* 07/19/2011   HGB 16.4 07/19/2011   HCT 50.2* 07/19/2011   PLT 96* 07/19/2011   GLUCOSE 168* 04/15/2011   CHOL  Value: 202        ATP III CLASSIFICATION:  <200     mg/dL   Desirable  811-914  mg/dL   Borderline High  >=782    mg/dL   High       * 9/56/2130   TRIG 217* 06/28/2009   HDL 32* 06/28/2009   LDLCALC  Value: 127        Total Cholesterol/HDL:CHD Risk Coronary Heart Disease Risk Table                     Men   Women  1/2 Average Risk   3.4   3.3  Average Risk       5.0   4.4  2 X Average Risk   9.6   7.1  3 X Average Risk  23.4   11.0        Use the calculated Patient Ratio above and the CHD Risk Table to determine the patient's CHD  Risk.        ATP III CLASSIFICATION (LDL):  <100     mg/dL   Optimal  865-784  mg/dL   Near or Above                    Optimal  130-159  mg/dL   Borderline  696-295  mg/dL   High  >284     mg/dL   Very High* 1/32/4401   ALT 18 04/15/2011   AST 16 04/15/2011   NA 140 04/15/2011   K 4.6 04/15/2011   CL 111 04/15/2011   CREATININE 0.99 04/15/2011   BUN 17 04/15/2011   CO2 18* 04/15/2011   INR 1.02 06/28/2009   HGBA1C  Value: 6.3 (NOTE)                                                                       According to the ADA Clinical Practice Recommendations for 2011, when HbA1c is used as a screening test:   >=6.5%   Diagnostic of Diabetes Mellitus           (if abnormal result  is confirmed)  5.7-6.4%   Increased risk of developing Diabetes Mellitus  References:Diagnosis and Classification of Diabetes Mellitus,Diabetes Care,2011,34(Suppl 1):S62-S69 and Standards of Medical Care in         Diabetes - 2011,Diabetes Care,2011,34  (Suppl 1):S11-S61.* 06/28/2009     Assessment / Plan:

## 2011-08-26 NOTE — CV Procedure (Signed)
   Cardiac Catheterization Procedure Note  Name: Ryan Copeland MRN: [id number] DOB: [birth year]  Procedure: Left Heart Cath, Selective Coronary Angiography, LV angiography  Indication: 61 year old white male with known history of coronary disease. 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 He underwent stenting of the proximal LAD in 2011 with a 3.0 x 32 mm ion stent. He presents now with recurrent chest pain.   Procedural Details: The right wrist was prepped, draped, and anesthetized with 1% lidocaine. Using the modified Seldinger technique, a 5 French sheath was introduced into the right radial artery. 3 mg of verapamil was administered through the sheath, weight-based unfractionated heparin was administered intravenously. Standard Judkins catheters were used for selective coronary angiography and left ventriculography. Catheter exchanges were performed over an exchange length guidewire. There were no immediate procedural complications. A TR band was used for radial hemostasis at the completion of the procedure.  The patient was transferred to the post catheterization recovery area for further monitoring.  Procedural Findings: Hemodynamics: AO 116/60 with a mean of 83 mmHg LV 119/20 mmHg  Coronary angiography: Coronary dominance: right  Left mainstem:  Normal.  Left anterior descending (LAD): The stent in the proximal LAD is widely patent throughout. There is moderate 20% narrowing in the LAD proximal to the stent. The remainder of the vessels without significant disease.  Left circumflex (LCx): The left circumflex gives rise to 2 large marginal branches. There is 20% narrowing prior to the takeoff of the first OM.  Right coronary artery (RCA): The right coronary is a dominant vessel. It has diffuse 70% disease in the proximal to mid vessel. Is occluded at the crux. There are excellent right to right and left to right collaterals.  Left ventriculography: Left  ventricular systolic function is normal, LVEF is estimated at 55-65%, there is no significant mitral regurgitation   Final Conclusions:   1. Single vessel obstructive coronary disease. 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 The stent in the proximal LAD is widely patent. 2. Normal LV function.  Recommendations: Continue medical management.  Peter [place] 08/26/2011, 9:09 AM

## 2011-08-26 NOTE — Interval H&P Note (Signed)
History and Physical Interval Note:  08/26/2011 8:37 AM  Ryan Copeland  has presented today for surgery, with the diagnosis of Chest pain  The various methods of treatment have been discussed with the patient and family. After consideration of risks, benefits and other options for treatment, the patient has consented to  Procedure(s) (LRB): LEFT HEART CATHETERIZATION WITH CORONARY ANGIOGRAM (N/A) as a surgical intervention .  The patient's history has been reviewed, patient examined, no change in status, stable for surgery.  I have reviewed the patients' chart and labs.  Questions were answered to the patient's satisfaction.     Theron Arista [facility] 08/26/2011 8:38 AM

## 2011-09-12 ENCOUNTER — Ambulatory Visit: Payer: Self-pay | Admitting: Surgery

## 2011-09-12 ENCOUNTER — Other Ambulatory Visit: Payer: Self-pay

## 2011-09-21 ENCOUNTER — Telehealth: Payer: Self-pay | Admitting: *Deleted

## 2011-09-21 NOTE — Telephone Encounter (Signed)
Pt called [facility] requesting to get Hep B vaccine.  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  Will review with MD

## 2011-09-22 ENCOUNTER — Telehealth: Payer: Self-pay | Admitting: *Deleted

## 2011-09-22 ENCOUNTER — Encounter (HOSPITAL_COMMUNITY): Payer: BC Managed Care – PPO

## 2011-09-22 NOTE — Telephone Encounter (Signed)
called patient and informed the patient I am trying to get in touch with the patient

## 2011-09-22 NOTE — Telephone Encounter (Signed)
left voice message informing the patient of the call back call made to him on 09-22-2011 at 4:34pm

## 2011-09-23 ENCOUNTER — Other Ambulatory Visit: Payer: BC Managed Care – PPO | Admitting: Lab

## 2011-09-23 ENCOUNTER — Telehealth: Payer: Self-pay | Admitting: *Deleted

## 2011-09-23 NOTE — Telephone Encounter (Signed)
Per MD ok for pt to have Hep B vaccine. Called [facility] for pt. Requested call back confirming message has been received

## 2011-09-23 NOTE — Telephone Encounter (Signed)
called patient back from voice message from the patient rescheduled patient for the lab only appointment to 09-27-2011 at 12:15 patient confirmed over the phone the new date and time gave patient appointment time and datea for the ultrasound at 11:30 am at the [facility] long hospital patient confirmed over the phone

## 2011-09-23 NOTE — Telephone Encounter (Signed)
Ok to have Hep B vaccine

## 2011-09-27 ENCOUNTER — Other Ambulatory Visit (HOSPITAL_BASED_OUTPATIENT_CLINIC_OR_DEPARTMENT_OTHER): Payer: BC Managed Care – PPO | Admitting: Lab

## 2011-09-27 DIAGNOSIS — C9110 Chronic lymphocytic leukemia of B-cell type not having achieved remission: Secondary | ICD-10-CM

## 2011-09-27 LAB — CBC WITH DIFFERENTIAL/PLATELET
Basophils Absolute: 0.1 10*3/uL (ref 0.0–0.1)
EOS%: 0.1 % (ref 0.0–7.0)
Eosinophils Absolute: 0.1 10*3/uL (ref 0.0–0.5)
HGB: 15.4 g/dL (ref 13.0–17.1)
MCV: 99.1 fL — ABNORMAL HIGH (ref 79.3–98.0)
MONO%: 1.1 % (ref 0.0–14.0)
NEUT#: 4.7 10*3/uL (ref 1.5–6.5)
RBC: 4.82 10*6/uL (ref 4.20–5.82)
RDW: 16.5 % — ABNORMAL HIGH (ref 11.0–14.6)
lymph#: 76.5 10*3/uL — ABNORMAL HIGH (ref 0.9–3.3)

## 2011-09-27 LAB — TECHNOLOGIST REVIEW

## 2011-10-24 ENCOUNTER — Other Ambulatory Visit (HOSPITAL_BASED_OUTPATIENT_CLINIC_OR_DEPARTMENT_OTHER): Payer: BC Managed Care – PPO | Admitting: Lab

## 2011-10-24 ENCOUNTER — Telehealth: Payer: Self-pay | Admitting: Medical Oncology

## 2011-10-24 DIAGNOSIS — C9110 Chronic lymphocytic leukemia of B-cell type not having achieved remission: Secondary | ICD-10-CM

## 2011-10-24 LAB — CBC WITH DIFFERENTIAL/PLATELET
BASO%: 0.2 % (ref 0.0–2.0)
Eosinophils Absolute: 0.1 10*3/uL (ref 0.0–0.5)
MCHC: 32.2 g/dL (ref 32.0–36.0)
MONO#: 1.2 10*3/uL — ABNORMAL HIGH (ref 0.1–0.9)
NEUT#: 4.4 10*3/uL (ref 1.5–6.5)
Platelets: 76 10*3/uL — ABNORMAL LOW (ref 140–400)
RBC: 5.1 10*6/uL (ref 4.20–5.82)
WBC: 107.9 10*3/uL (ref 4.0–10.3)
lymph#: 102.1 10*3/uL — ABNORMAL HIGH (ref 0.9–3.3)

## 2011-10-24 LAB — TECHNOLOGIST REVIEW

## 2011-10-24 NOTE — Telephone Encounter (Signed)
[facility], per MD, patient to come to office 9/10 @ 1230 for MD visit.  Instructed patient to call back confirming message and date/time of appointment.

## 2011-10-25 ENCOUNTER — Ambulatory Visit (HOSPITAL_BASED_OUTPATIENT_CLINIC_OR_DEPARTMENT_OTHER): Payer: BC Managed Care – PPO | Admitting: Oncology

## 2011-10-25 ENCOUNTER — Telehealth: Payer: Self-pay | Admitting: *Deleted

## 2011-10-25 ENCOUNTER — Encounter: Payer: Self-pay | Admitting: Oncology

## 2011-10-25 VITALS — BP 123/77 | HR 72 | Temp 98.8°F | Resp 20 | Ht 72.0 in | Wt 219.4 lb

## 2011-10-25 DIAGNOSIS — D696 Thrombocytopenia, unspecified: Secondary | ICD-10-CM

## 2011-10-25 DIAGNOSIS — D72829 Elevated white blood cell count, unspecified: Secondary | ICD-10-CM

## 2011-10-25 DIAGNOSIS — C9110 Chronic lymphocytic leukemia of B-cell type not having achieved remission: Secondary | ICD-10-CM

## 2011-10-25 NOTE — Telephone Encounter (Signed)
Per central scheduling 10-28-2011 scans starting at 3:00pm patient to arrive at 2:45pm  11-04-2011 ct biopsy and marrow starting at 9:30am  Central scheduling they would inform the patient of the dates and times of the appointments  Made patient appointment with lab and md on 11-22-2011 (md scheduler made patient aware of this appointment)

## 2011-10-25 NOTE — Patient Instructions (Addendum)
Staging with CT chest/abdomen/pelvis   Bone marrow biopsy by IR   I will see you back in November 22, 2011 for follow up

## 2011-10-25 NOTE — Progress Notes (Signed)
OFFICE PROGRESS NOTE  CC  [name], PA [address] [place] [address]  DIAGNOSIS: 61 year old gentleman with  #1 stage 0 CLL.  #2 polycythemia likely secondary now resolved.  PRIOR THERAPY: Observation  CURRENT THERAPY: Observation  INTERVAL HISTORY: Ryan Copeland 61 y.o. male returns for followup visit today. His white count has gone up to 107,000 with thrombocytopenia. He does complain of fatigue and decreased energy. On my exam he is noted to have enlarging lymph nodes in the axilla as well as the supraclavicular and cervical regions. He has no bleeding problems no fevers chills night sweats no diarrhea or constipation no dominant no pain. No easy bruising.Remainder of the 10 point review of systems is unremarkable MEDICAL HISTORY: Past Medical History  Diagnosis Date  . 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 Hyperlipidemia   . Tobacco abuse   . PVD (peripheral vascular disease)   . 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  MEDICATIONS:  Current Outpatient Prescriptions  Medication Sig Dispense Refill  . aspirin 325 MG tablet Take 325 mg by mouth daily.        Marland Kitchen esomeprazole (NEXIUM) 40 MG capsule Take 1 capsule (40 mg total) by mouth 2 (two) times daily.  180 capsule  3  . metoprolol succinate (TOPROL-XL) 50 MG 24 hr tablet Take 1 tablet (50 mg total) by mouth daily. Take with or immediately following a meal.  90 tablet  3  . nitroGLYCERIN (NITROSTAT) 0.4 MG SL tablet Place 1 tablet (0.4 mg total) under the tongue every 5 (five) minutes as needed.  25 tablet  12  . prasugrel (EFFIENT) 10 MG TABS Take 1 tablet (10 mg total) by mouth daily.  90 tablet  3  . pregabalin (LYRICA) 75 MG capsule Take 75-150 mg by mouth 4 (four) times daily. 1 cap three times daily and 2 caps at bedtime      . rosuvastatin (CRESTOR) 10 MG tablet Take 1 tablet (10 mg total) by mouth daily.  90 tablet   3  . traMADol (ULTRAM) 50 MG tablet Take 50 mg by mouth every 6 (six) hours as needed. For pain        SURGICAL HISTORY:  Past Surgical History  Procedure Date  . Femoral stents   . Coronary stent placement May 2011    REVIEW OF SYSTEMS:  Pertinent items are noted in HPI.   PHYSICAL EXAMINATION:   HEENT exam: EOMI PERRLA sclerae anicteric no conjunctival pallor oral mucosa is moist neck is supple  lymphadenopathy is noted in the cervical supraclavicular and axillary regions. Lungs: Clear to auscultation and percussion cardiovascular: Regular rate rhythm no murmurs gallops or rubs abdomen: Protuberant nontender nondistended spleen tip is palpable no hepatomegaly extremities: No edema clubbing or cyanosis neuro: Patient is alert oriented otherwise nonfocal.  ECOG PERFORMANCE STATUS: 0 - Asymptomatic  Blood pressure 123/77, pulse 72, temperature 98.8 F (37.1 C), temperature source Oral, resp. rate 20, height 6' (1.829 m), weight 219 lb 6.4 oz (99.519 kg).  LABORATORY DATA: Lab Results  Component Value Date   WBC 107.9* 10/24/2011   HGB 16.4 10/24/2011   HCT 51.0* 10/24/2011   MCV 100.0* 10/24/2011   PLT 76* 10/24/2011      Chemistry      Component Value Date/Time   NA 139 08/22/2011 0904   K 5.0 08/22/2011 0904   CL 106 08/22/2011 0904  CO2 25 08/22/2011 0904   BUN 20 08/22/2011 0904   CREATININE 1.3 08/22/2011 0904      Component Value Date/Time   CALCIUM 9.2 08/22/2011 0904   ALKPHOS 65 08/22/2011 0904   AST 19 08/22/2011 0904   ALT 27 08/22/2011 0904   BILITOT 0.5 08/22/2011 0904       RADIOGRAPHIC STUDIES:  No results found.  ASSESSMENT: 61 year old gentleman with  #1 CLL now with progressive disease as evidenced by thrombocytopenia and leukocytosis.I have discussed this with the patient. I have recommended at this time that we proceed with a bone marrow biopsy and aspirate to see if indeed he has progressive disease. We will also obtain CT of the neck chest abdomen and pelvis to look  for any progressive lymphadenopathy. Patient does understand that if indeed there is progressive disease and he continues to have significant rise in his white count and become cytopenic than he will need to be begun on treatments for his CLL. All questions were answered today.  PLAN:  #1 proceed with staging scans including neck chest abdomen and pelvis.  #2 I will set him also up for bone marrow biopsy and aspirate.  #3 I will plan on seeing the patient back after he has his staging workup.  All questions were answered. The patient knows to call the clinic with any problems, questions or concerns. We can certainly see the patient much sooner if necessary.  I spent 25 minutes counseling the patient face to face. The total time spent in the appointment was 30 minutes.    Drue Second, MD Medical/Oncology [facility] [phone] (beeper) [phone] (Office)  10/25/2011, 12:52 PM

## 2011-10-28 ENCOUNTER — Other Ambulatory Visit (HOSPITAL_COMMUNITY): Payer: BC Managed Care – PPO

## 2011-11-03 ENCOUNTER — Ambulatory Visit (HOSPITAL_COMMUNITY)
Admission: RE | Admit: 2011-11-03 | Discharge: 2011-11-03 | Disposition: A | Discharge status: Home / Self Care | Payer: BC Managed Care – PPO | Source: Ambulatory Visit | Attending: Oncology | Admitting: Oncology

## 2011-11-03 ENCOUNTER — Encounter (HOSPITAL_COMMUNITY): Payer: Self-pay

## 2011-11-03 ENCOUNTER — Telehealth: Payer: Self-pay

## 2011-11-03 DIAGNOSIS — Z9089 Acquired absence of other organs: Secondary | ICD-10-CM | POA: Insufficient documentation

## 2011-11-03 DIAGNOSIS — N400 Enlarged prostate without lower urinary tract symptoms: Secondary | ICD-10-CM | POA: Insufficient documentation

## 2011-11-03 DIAGNOSIS — C9110 Chronic lymphocytic leukemia of B-cell type not having achieved remission: Secondary | ICD-10-CM

## 2011-11-03 DIAGNOSIS — I6529 Occlusion and stenosis of unspecified carotid artery: Secondary | ICD-10-CM | POA: Insufficient documentation

## 2011-11-03 DIAGNOSIS — R599 Enlarged lymph nodes, unspecified: Secondary | ICD-10-CM | POA: Insufficient documentation

## 2011-11-03 DIAGNOSIS — R918 Other nonspecific abnormal finding of lung field: Secondary | ICD-10-CM | POA: Insufficient documentation

## 2011-11-03 DIAGNOSIS — R161 Splenomegaly, not elsewhere classified: Secondary | ICD-10-CM | POA: Insufficient documentation

## 2011-11-03 MED ORDER — IOHEXOL 300 MG/ML  SOLN
100.0000 mL | Freq: Once | INTRAMUSCULAR | Status: AC | PRN
  Administered 2011-11-03: 100 mL via INTRAVENOUS

## 2011-11-03 NOTE — Telephone Encounter (Signed)
Dr.Hung's office called left message on Donna's voice mail ok with Dr.Jordan for patient to hold effient 5 days before colonoscopy.

## 2011-11-04 ENCOUNTER — Other Ambulatory Visit (HOSPITAL_COMMUNITY): Payer: BC Managed Care – PPO

## 2011-11-04 ENCOUNTER — Encounter (HOSPITAL_COMMUNITY): Payer: Self-pay | Admitting: Pharmacy Technician

## 2011-11-10 ENCOUNTER — Other Ambulatory Visit: Payer: Self-pay | Admitting: Radiology

## 2011-11-11 ENCOUNTER — Other Ambulatory Visit (HOSPITAL_COMMUNITY): Payer: BC Managed Care – PPO

## 2011-11-15 ENCOUNTER — Other Ambulatory Visit: Payer: Self-pay | Admitting: Radiology

## 2011-11-18 ENCOUNTER — Ambulatory Visit (HOSPITAL_COMMUNITY)
Admission: RE | Admit: 2011-11-18 | Discharge: 2011-11-18 | Disposition: A | Discharge status: Home / Self Care | Payer: BC Managed Care – PPO | Source: Ambulatory Visit | Attending: Oncology | Admitting: Oncology

## 2011-11-18 ENCOUNTER — Ambulatory Visit (HOSPITAL_COMMUNITY)
Admission: RE | Admit: 2011-11-18 | Discharge: 2011-11-18 | Payer: BC Managed Care – PPO | Source: Ambulatory Visit | Attending: Oncology | Admitting: Oncology

## 2011-11-18 ENCOUNTER — Inpatient Hospital Stay (HOSPITAL_COMMUNITY): Admission: RE | Admit: 2011-11-18 | Payer: BC Managed Care – PPO | Source: Ambulatory Visit

## 2011-11-18 ENCOUNTER — Encounter (HOSPITAL_COMMUNITY): Payer: Self-pay

## 2011-11-18 DIAGNOSIS — C9110 Chronic lymphocytic leukemia of B-cell type not having achieved remission: Secondary | ICD-10-CM

## 2011-11-18 DIAGNOSIS — I252 Old myocardial infarction: Secondary | ICD-10-CM | POA: Insufficient documentation

## 2011-11-18 DIAGNOSIS — Z79899 Other long term (current) drug therapy: Secondary | ICD-10-CM | POA: Insufficient documentation

## 2011-11-18 DIAGNOSIS — I739 Peripheral vascular disease, unspecified: Secondary | ICD-10-CM | POA: Insufficient documentation

## 2011-11-18 DIAGNOSIS — D72829 Elevated white blood cell count, unspecified: Secondary | ICD-10-CM | POA: Insufficient documentation

## 2011-11-18 DIAGNOSIS — E785 Hyperlipidemia, unspecified: Secondary | ICD-10-CM | POA: Insufficient documentation

## 2011-11-18 LAB — BASIC METABOLIC PANEL
BUN: 15 mg/dL (ref 6–23)
Calcium: 9.4 mg/dL (ref 8.4–10.5)
Creatinine, Ser: 0.8 mg/dL (ref 0.50–1.35)
GFR calc Af Amer: >90 mL/min (ref >90)
GFR calc non Af Amer: >90 mL/min (ref >90)

## 2011-11-18 LAB — CBC
HCT: 46.1 % (ref 39.0–52.0)
MCHC: 34.1 g/dL (ref 30.0–36.0)
Platelets: 81 10*3/uL — ABNORMAL LOW (ref 150–400)
RDW: 15.4 % (ref 11.5–15.5)
WBC: 80.9 10*3/uL (ref 4.0–10.5)

## 2011-11-18 LAB — BONE MARROW EXAM: Bone Marrow Exam: 702

## 2011-11-18 LAB — APTT: aPTT: 27 seconds (ref 24–37)

## 2011-11-18 LAB — PROTIME-INR: INR: 1.01 (ref 0.00–1.49)

## 2011-11-18 MED ORDER — MIDAZOLAM HCL 5 MG/5ML IJ SOLN
INTRAMUSCULAR | Status: AC | PRN
  Administered 2011-11-18 (×2): 1 mg via INTRAVENOUS
  Administered 2011-11-18: 2 mg via INTRAVENOUS

## 2011-11-18 MED ORDER — FENTANYL CITRATE 0.05 MG/ML IJ SOLN
INTRAMUSCULAR | Status: AC
  Filled 2011-11-18: qty 6

## 2011-11-18 MED ORDER — FENTANYL CITRATE 0.05 MG/ML IJ SOLN
INTRAMUSCULAR | Status: AC | PRN
  Administered 2011-11-18: 50 ug via INTRAVENOUS
  Administered 2011-11-18: 100 ug via INTRAVENOUS

## 2011-11-18 MED ORDER — MIDAZOLAM HCL 2 MG/2ML IJ SOLN
INTRAMUSCULAR | Status: AC
  Filled 2011-11-18: qty 6

## 2011-11-18 MED ORDER — SODIUM CHLORIDE 0.9 % IV SOLN
Freq: Once | INTRAVENOUS | Status: AC
  Administered 2011-11-18: 08:00:00 via INTRAVENOUS

## 2011-11-18 MED ORDER — HYDROCODONE-ACETAMINOPHEN 5-325 MG PO TABS
1.0000 | ORAL_TABLET | ORAL | Status: DC | PRN
  Filled 2011-11-18: qty 2

## 2011-11-18 NOTE — H&P (Signed)
Chief Complaint: "I'm here for a bone marrow biopsy" Referring Physician:Khan HPI: Ryan Copeland is an 61 y.o. male with new finding of marked leukocytosis. He is referred for bone marrow biopsy. PMHx reviewed as below  Past Medical History:  Past Medical History  Diagnosis Date  . 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 Hyperlipidemia   . Tobacco abuse   . PVD (peripheral vascular disease)   . CLL (chronic lymphocytic leukemia) 03/18/2011    Past Surgical History:  Past Surgical History  Procedure Date  . Femoral stents   . Coronary stent placement May 2011  . Cholecystectomy 2007    Family History:  Family History  Problem Relation Age of Onset  . Lung cancer Mother 63  . Cancer Mother     lung  . Heart failure Father 71  . Heart disease Father     Social History:  reports that he has been smoking Cigarettes.  He has a 43 pack-year smoking history. He has never used smokeless tobacco. He reports that he drinks about 7.2 ounces of alcohol per week. He reports that he does not use illicit drugs.  Allergies:  Allergies  Allergen Reactions  . Codeine Hives    Pt states he can take a few, more reaction with extended doses.    Medications: aspirin 325 MG tablet (Taking) Sig - Route: Take 325 mg by mouth daily. - Oral Class: Historical Med Number of times this order has been changed since signing: 4 Order [address] esomeprazole (NEXIUM) 40 MG capsule (Taking) 180 capsule 3 08/11/2011 Sig - Route: Take 1 capsule (40 mg total) by mouth 2 (two) times daily. - Oral Number of times this order has been changed since signing: 1 [address] metFORMIN (GLUCOPHAGE-XR) 500 MG 24 hr tablet (Taking) Sig - Route: Take 500 mg by mouth 2 (two) times daily. - Oral Class: Historical Med metoprolol succinate (TOPROL-XL) 50 MG 24 hr tablet (Taking) Sig - Route: Take 50 mg by mouth daily with breakfast. Take with or immediately following a meal. - Oral  Class: Historical Med Number of times this order has been changed since signing: 1 [address] prasugrel (EFFIENT) 10 MG TABS (Taking) Sig - Route: Take 10 mg by mouth daily with breakfast. - Oral Class: Historical Med Number of times this order has been changed since signing: 1 Order [address] pregabalin (LYRICA) 75 MG capsule (Taking) Sig - Route: Take 75-150 mg by mouth 4 (four) times daily. 1 cap three times daily and 2 caps at bedtime - Oral Class: Historical Med tetrahydrozoline 0.05 % ophthalmic solution (Taking) Sig - Route: Place 1 drop into both eyes as needed. Red eyes - Both Eyes Class: Historical Med topiramate (TOPAMAX) 25 MG capsule (Taking) Sig - Route: Take 25 mg by mouth 2 (two) times daily. - Oral Class: Historical Med traMADol (ULTRAM) 50 MG tablet (Taking) Sig - Route: Take 50 mg by mouth every 6 (six) hours as needed. For pain - Oral Class: Historical Med nitroGLYCERIN (NITROSTAT) 0.4 MG SL tablet 25 tablet 12 07/29/2011 Sig - Route: Place 1 tablet (0.4 mg total) under the tongue every 5 (five) minutes as needed. - Sublingual Number of times this order has been    Please HPI for pertinent positives, otherwise complete 10 system ROS negative.  Physical Exam: Blood pressure 121/72, pulse 62, temperature 98.1 F (36.7 C), temperature source Oral, resp. rate 18, height 6' (1.829 m),  weight 218 lb (98.884 kg), SpO2 94.00%. Body mass index is 29.57 kg/(m^2).   General Appearance:  Alert, cooperative, no distress, appears stated age  Head:  Normocephalic, without obvious abnormality, atraumatic  ENT: Unremarkable  Neck: Supple, symmetrical, trachea midline, no adenopathy, thyroid: not enlarged, symmetric, no tenderness/mass/nodules  Lungs:   Clear to auscultation bilaterally, no w/r/r, respirations unlabored without use of accessory muscles.  Chest Wall:  No tenderness or deformity  Heart:  Regular rate and rhythm, S1, S2 normal, no murmur, rub or gallop. Carotids 2+ without  bruit.  Abdomen:   Soft, non-tender, non distended. Bowel sounds active all four quadrants,  no masses, no organomegaly.  Extremities: Extremities normal, atraumatic, no cyanosis or edema  Pulses: 2+ and symmetric  Skin: Skin color, texture, turgor normal, no rashes or lesions  Neurologic: Normal affect, no gross deficits.   No results found for this or any previous visit (from the past 48 hour(s)). No results found.  Assessment/Plan MArked leukocytosis CT guided BM biopsy Discussed procedure and risks. Consent signed in chart  Brayton El PA-C 11/18/2011, 8:12 AM

## 2011-11-18 NOTE — Procedures (Signed)
Procedure:  CT guided right iliac bone marrow biopsy Aspirate and core samples via 11G needle.

## 2011-11-18 NOTE — H&P (Signed)
Agree 

## 2011-11-22 ENCOUNTER — Ambulatory Visit: Payer: BC Managed Care – PPO | Admitting: Oncology

## 2011-11-22 ENCOUNTER — Other Ambulatory Visit: Payer: BC Managed Care – PPO | Admitting: Lab

## 2011-11-23 ENCOUNTER — Other Ambulatory Visit: Payer: BC Managed Care – PPO | Admitting: Lab

## 2011-11-24 ENCOUNTER — Other Ambulatory Visit (HOSPITAL_BASED_OUTPATIENT_CLINIC_OR_DEPARTMENT_OTHER): Payer: BC Managed Care – PPO

## 2011-11-24 ENCOUNTER — Ambulatory Visit (HOSPITAL_BASED_OUTPATIENT_CLINIC_OR_DEPARTMENT_OTHER): Payer: BC Managed Care – PPO | Admitting: Oncology

## 2011-11-24 ENCOUNTER — Encounter: Payer: Self-pay | Admitting: Oncology

## 2011-11-24 ENCOUNTER — Telehealth: Payer: Self-pay | Admitting: Oncology

## 2011-11-24 VITALS — BP 147/79 | HR 71 | Temp 98.4°F | Resp 20 | Ht 72.0 in | Wt 218.4 lb

## 2011-11-24 DIAGNOSIS — C9110 Chronic lymphocytic leukemia of B-cell type not having achieved remission: Secondary | ICD-10-CM

## 2011-11-24 LAB — CBC WITH DIFFERENTIAL/PLATELET
Basophils Absolute: 0.1 10*3/uL (ref 0.0–0.1)
EOS%: 0.1 % (ref 0.0–7.0)
Eosinophils Absolute: 0.1 10*3/uL (ref 0.0–0.5)
LYMPH%: 89.8 % — ABNORMAL HIGH (ref 14.0–49.0)
MCH: 32.6 pg (ref 27.2–33.4)
MCV: 100.7 fL — ABNORMAL HIGH (ref 79.3–98.0)
MONO%: 1.7 % (ref 0.0–14.0)
NEUT#: 5.1 10*3/uL (ref 1.5–6.5)
Platelets: 77 10*3/uL — ABNORMAL LOW (ref 140–400)
RBC: 4.77 10*6/uL (ref 4.20–5.82)
RDW: 15.5 % — ABNORMAL HIGH (ref 11.0–14.6)

## 2011-11-24 NOTE — Telephone Encounter (Signed)
gve the pt his nov 2013 appt calendar °

## 2011-11-24 NOTE — Patient Instructions (Addendum)
I will see you back in 1 month.

## 2011-11-24 NOTE — Progress Notes (Signed)
OFFICE PROGRESS NOTE  CC  Ryan Gemma, PA [address] [place] [address]  DIAGNOSIS: 61 year old gentleman with  #1 stage II CLL.  #2 polycythemia likely secondary now resolved.  PRIOR THERAPY: Observation  CURRENT THERAPY: Observation  INTERVAL HISTORY: Ryan Copeland 61 y.o. male returns for followup visit today. Today he is accompanied by his wife. This is the first time that I have seen his wife since have been taking care of Ryan Copeland. Clinically he seems to be doing well without any significant complaints. He had his bone marrow biopsy and aspirate performed which does reveal evidence of CLL as suspected. Clinically again patient is doing well. He denies any fevers chills night sweats headaches he has a little bit of fatigue but nothing out of the ordinary. He still continues to have some lymphadenopathy but it remains stable at this time. He has not had any early satiety no weight loss or weight gain. He has not noticed any bleeding or bruising no hematuria hematochezia or melena hemoptysis or hematemesis. He is continuing to work full-time. He has not had any intercurrent illnesses such as pneumonia as colds or flus. Remainder of the 10 point review of systems is negative MEDICAL HISTORY: Past Medical History  Diagnosis Date  . 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 Hyperlipidemia   . Tobacco abuse   . PVD (peripheral vascular disease)   . 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  MEDICATIONS:  Current Outpatient Prescriptions  Medication Sig Dispense Refill  . aspirin 325 MG tablet Take 325 mg by mouth daily.        Marland Kitchen esomeprazole (NEXIUM) 40 MG capsule Take 1 capsule (40 mg total) by mouth 2 (two) times daily.  180 capsule  3  . metFORMIN (GLUCOPHAGE-XR) 500 MG 24 hr tablet Take 500 mg by mouth 2 (two) times daily.      . metoprolol succinate (TOPROL-XL) 50 MG 24 hr tablet  Take 50 mg by mouth daily with breakfast. Take with or immediately following a meal.      . prasugrel (EFFIENT) 10 MG TABS Take 10 mg by mouth daily with breakfast.      . pregabalin (LYRICA) 75 MG capsule Take 75-150 mg by mouth 4 (four) times daily. 1 cap three times daily and 2 caps at bedtime      . tetrahydrozoline 0.05 % ophthalmic solution Place 1 drop into both eyes as needed. Red eyes      . topiramate (TOPAMAX) 25 MG capsule Take 25 mg by mouth 2 (two) times daily.      . nitroGLYCERIN (NITROSTAT) 0.4 MG SL tablet Place 1 tablet (0.4 mg total) under the tongue every 5 (five) minutes as needed.  25 tablet  12  . traMADol (ULTRAM) 50 MG tablet Take 50 mg by mouth every 6 (six) hours as needed. For pain        SURGICAL HISTORY:  Past Surgical History  Procedure Date  . Femoral stents   . Coronary stent placement May 2011  . Cholecystectomy 2007    REVIEW OF SYSTEMS:  Pertinent items are noted in HPI.   PHYSICAL EXAMINATION:   HEENT exam: EOMI PERRLA sclerae anicteric no conjunctival pallor oral mucosa is moist neck is supple  lymphadenopathy is noted in the cervical supraclavicular and axillary regions. Lungs: Clear to auscultation and percussion cardiovascular: Regular rate rhythm no murmurs gallops or rubs  abdomen: Protuberant nontender nondistended spleen tip is palpable no hepatomegaly extremities: No edema clubbing or cyanosis neuro: Patient is alert oriented otherwise nonfocal.  ECOG PERFORMANCE STATUS: 0 - Asymptomatic  Blood pressure 147/79, pulse 71, temperature 98.4 F (36.9 C), temperature source Oral, resp. rate 20, height 6' (1.829 m), weight 218 lb 6.4 oz (99.066 kg).  LABORATORY DATA: Lab Results  Component Value Date   WBC 62.4* 11/24/2011   HGB 15.5 11/24/2011   HCT 48.0 11/24/2011   MCV 100.7* 11/24/2011   PLT 77* 11/24/2011      Chemistry      Component Value Date/Time   NA 138 11/18/2011 0830   K 4.7 11/18/2011 0830   CL 106 11/18/2011 0830   CO2 23  11/18/2011 0830   BUN 15 11/18/2011 0830   CREATININE 0.80 11/18/2011 0830      Component Value Date/Time   CALCIUM 9.4 11/18/2011 0830   ALKPHOS 65 08/22/2011 0904   AST 19 08/22/2011 0904   ALT 27 08/22/2011 0904   BILITOT 0.5 08/22/2011 0904    FINAL DIAGNOSIS Diagnosis Bone Marrow, Aspirate,Biopsy, and Clot, right iliac bone - HYPERCELLULAR BONE MARROW WITH EXTENSIVE INVOLVEMENT BY CHRONIC LYMPHOCYTIC LEUKEMIA. PERIPHERAL BLOOD: - CHRONIC LYMPHOCYTIC LEUKEMIA. Ryan Bruin MD Pathologist, Electronic Signature (Case signed 11/22/2011) GROSS AND MICROSCOPIC INFORMATION Specimen Clinical Information hx CLL [jl] Source Bone Marrow, Aspirate,Biopsy, and Clot, right iliac bone Microscopic LAB DATA: CBC performed on 11/18/11 shows: WBC 80.87 K/ul Neutrophils 5% HB 15.7 g/dl Lymphocytes 78% HCT 29.5 % Monocytes 1% MCV 96.0 fL Eosinophils 0% RDW 15.4 % Basophils 0% PLT 81 K/ul PERIPHERAL BLOOD SMEAR: The red blood cells display mild anisopoikilocytosis with minimal polychromasia. The white blood cells are markedly increased in number with lymphocytosis. The lymphocytes primarily consist of small lymphoid cells with generally high nuclear cytoplasmic ratio, dense chromatin with block type clumping, and inconspicuous nucleoli. There are numerous smudge cells in the 1 of 3 FINAL for Ryan Copeland, Ryan Copeland [phone]) Microscopic(continued) background. A significant prolymphocytic component is not present. The platelets are decreased in number. BONE MARROW ASPIRATE: Erythroid precursors: Relatively decreased with progressive maturation. Granulocytic precursors: Relatively decreased with progressive maturation. Megakaryocytes: Scattered forms are seen displaying normal morphology. Lymphocytes/plasma cells: The lymphocytes are markedly increased in number representing 95% of all cells and consist of small lymphoid cells with high nuclear cytoplasmic ratio, dense chromatin and inconspicuous nucleoli.  A significant prolymphocytic or large lymphoid cell component is not present. Significant plasma cell aggregates are not present. TOUCH PREPARATIONS: Predominance of small lymphoid cells. CLOT and BIOPSY: The sections show 90% cellularity with extensive infiltration of the medullary space by numerous aggregates, interstitial infiltrates and diffuse sheets of primarily small lymphoid cells displaying high nuclear cytoplasmic ratio, dense chromatin and small to inconspicuous nucleoli. Scattered small proliferation centers are seen. Myeloid hematopoiesis appears decreased in the background. Immunohistochemical stains for CD20, CD79a, CD3, CD5, CD10 and cyclin D1 were performed with appropriate controls. The lymphoid population is stains positively ffor CD79a (B cell marker) with patchy weaker positivity for CD20. There is patchy weak coexpression of CD5. No CD10 or cyclin D1 expression is identified. IRON STAIN: Iron stains are performed on a bone marrow aspirate smear and section of clot. The controls stained appropriately. Storage Iron: Decreased. Ringed Sideroblasts: Absent. ADDITIONAL DATA / TESTING: Specimen was sent for cytogenetic analysis and a separate report will follow. Flow cytometric analysis of the lymphoid population ([id number]) shows a major B cell population expressing pan B cell antigens including CD20 and  CD23 associated with coexpression of CD5. This population shows extremely dim/negative staining for surface immunoglobulin light chains. (BNS:gt, 11/21/11) Specimen Table Bone Marrow count performed on 500 cells shows: Blasts: 0% Myeloid 3% Promyelocyts: 0% Myelocytes: 0% Erythroid 2% Metamyelocyts: 1% Bands: 1% Lymphocytes: 95% Neutrophils: 1% Eosinophils: 0% Plasma Cells: 0% Basophils: 0% Monocytes: 0% M:E ratio: 1.5 Gross Received in [facility] are tissue fragments which aggregate 0.4 x 0.4 x 0.1 cm. The specimen is submitted in toto. Received in a separate container  in [facility] are two cores of bone measuring 2.2 and 2.8 cm in length and each 0.2 cm in diameter. The specimen is submitted in toto following decalcification. (GP:eps 11/18/11) 2 of   RADIOGRAPHIC STUDIES:  No results found.  ASSESSMENT: 61 year old gentleman with  #1 CLL. A CBC done today now shows the count to be lower than it had been previously platelets are stable he does not have any evidence of autoimmune hemolytic anemia. I do think that he remains quite stable. The fact that he has not had any infections there is no fatigue there is no hemolysis I think we could continue to observe him for now. I have discussed this with the patient as well as his wife.  PLAN:  #1 patient will be observed for now.  #2 I do suspect that eventually we will need to treat him sooner rather than later but I would like to delay as long as I can as long as he can tolerate observation only.  #3 I will continue to monitor his CBC on a monthly basis.  All questions were answered. The patient knows to call the clinic with any problems, questions or concerns. We can certainly see the patient much sooner if necessary.  I spent 25 minutes counseling the patient face to face. The total time spent in the appointment was 30 minutes.    Drue Second, MD Medical/Oncology [facility] [phone] (beeper) [phone] (Office)  11/24/2011, 1:08 PM

## 2011-12-14 ENCOUNTER — Other Ambulatory Visit (HOSPITAL_COMMUNITY): Payer: BC Managed Care – PPO

## 2011-12-26 ENCOUNTER — Encounter: Payer: Self-pay | Admitting: Oncology

## 2011-12-26 ENCOUNTER — Ambulatory Visit (HOSPITAL_BASED_OUTPATIENT_CLINIC_OR_DEPARTMENT_OTHER): Payer: BC Managed Care – PPO | Admitting: Oncology

## 2011-12-26 ENCOUNTER — Other Ambulatory Visit (HOSPITAL_BASED_OUTPATIENT_CLINIC_OR_DEPARTMENT_OTHER): Payer: BC Managed Care – PPO | Admitting: Lab

## 2011-12-26 ENCOUNTER — Telehealth: Payer: Self-pay | Admitting: *Deleted

## 2011-12-26 VITALS — BP 118/72 | HR 69 | Temp 97.9°F | Resp 20 | Ht 72.0 in | Wt 214.3 lb

## 2011-12-26 DIAGNOSIS — F17200 Nicotine dependence, unspecified, uncomplicated: Secondary | ICD-10-CM

## 2011-12-26 DIAGNOSIS — C9110 Chronic lymphocytic leukemia of B-cell type not having achieved remission: Secondary | ICD-10-CM

## 2011-12-26 LAB — MANUAL DIFFERENTIAL
Basophil: 0 % (ref 0–2)
EOS: 0 % (ref 0–7)
LYMPH: 93 % — ABNORMAL HIGH (ref 14–49)
MONO: 1 % (ref 0–14)
Metamyelocytes: 0 % (ref 0–0)
Myelocytes: 0 % (ref 0–0)
RBC Comments: NORMAL

## 2011-12-26 LAB — COMPREHENSIVE METABOLIC PANEL (CC13)
ALT: 142 U/L — ABNORMAL HIGH (ref 0–55)
AST: 61 U/L — ABNORMAL HIGH (ref 5–34)
Albumin: 3.8 g/dL (ref 3.5–5.0)
Alkaline Phosphatase: 144 U/L (ref 40–150)
Chloride: 107 mEq/L (ref 98–107)
Glucose: 153 mg/dl — ABNORMAL HIGH (ref 70–99)
Total Bilirubin: 0.74 mg/dL (ref 0.20–1.20)
Total Protein: 6.1 g/dL — ABNORMAL LOW (ref 6.4–8.3)

## 2011-12-26 LAB — CBC WITH DIFFERENTIAL/PLATELET
HGB: 14.4 g/dL (ref 13.0–17.1)
MCH: 32.5 pg (ref 27.2–33.4)
MCV: 96.2 fL (ref 79.3–98.0)
Platelets: 76 10*3/uL — ABNORMAL LOW (ref 140–400)
WBC: 95.6 10*3/uL (ref 4.0–10.3)

## 2011-12-26 MED ORDER — ALPRAZOLAM 0.25 MG PO TABS: ORAL_TABLET | ORAL | Status: DC

## 2011-12-26 NOTE — Telephone Encounter (Signed)
The pt has his dec 2013 appt calendar

## 2011-12-26 NOTE — Progress Notes (Signed)
OFFICE PROGRESS NOTE  CC  Ryan Gemma, PA [address] [place] [address]  DIAGNOSIS: 61 year old gentleman with  #1 stage II CLL.  #2 polycythemia likely secondary now resolved.  PRIOR THERAPY: Observation  CURRENT THERAPY: Observation  INTERVAL HISTORY: Ryan Copeland 61 y.o. male returns for followup visit today. Today he is accompanied by his wife.  Clinically he seems to be doing well without any significant complaints.he is exhibiting some anxiety as is his wife. This is highly unusual. He is ready to quit smoking. He denies any fevers chills night sweats headaches he has a little bit of fatigue but nothing out of the ordinary. He still continues to have some lymphadenopathy but it remains stable at this time. He has not had any early satiety no weight loss or weight gain. He has not noticed any bleeding or bruising no hematuria hematochezia or melena hemoptysis or hematemesis. He is continuing to work full-time. He has not had any intercurrent illnesses such as pneumonia as colds or flus. Remainder of the 10 point review of systems is negative MEDICAL HISTORY: Past Medical History  Diagnosis Date  . 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 Hyperlipidemia   . Tobacco abuse   . PVD (peripheral vascular disease)   . 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  MEDICATIONS:  Current Outpatient Prescriptions  Medication Sig Dispense Refill  . aspirin 325 MG tablet Take 325 mg by mouth daily.        Marland Kitchen esomeprazole (NEXIUM) 40 MG capsule Take 1 capsule (40 mg total) by mouth 2 (two) times daily.  180 capsule  3  . metFORMIN (GLUCOPHAGE-XR) 500 MG 24 hr tablet Take 500 mg by mouth 2 (two) times daily.      . metoprolol succinate (TOPROL-XL) 50 MG 24 hr tablet Take 50 mg by mouth daily with breakfast. Take with or immediately following a meal.      . nitroGLYCERIN (NITROSTAT) 0.4 MG SL tablet  Place 1 tablet (0.4 mg total) under the tongue every 5 (five) minutes as needed.  25 tablet  12  . prasugrel (EFFIENT) 10 MG TABS Take 10 mg by mouth daily with breakfast.      . pregabalin (LYRICA) 75 MG capsule Take 75-150 mg by mouth 4 (four) times daily. 1 cap three times daily and 2 caps at bedtime      . tetrahydrozoline 0.05 % ophthalmic solution Place 1 drop into both eyes as needed. Red eyes      . topiramate (TOPAMAX) 25 MG capsule Take 25 mg by mouth 2 (two) times daily.      . traMADol (ULTRAM) 50 MG tablet Take 50 mg by mouth every 6 (six) hours as needed. For pain        SURGICAL HISTORY:  Past Surgical History  Procedure Date  . Femoral stents   . Coronary stent placement May 2011  . Cholecystectomy 2007    REVIEW OF SYSTEMS:  Pertinent items are noted in HPI.   PHYSICAL EXAMINATION:   HEENT exam: EOMI PERRLA sclerae anicteric no conjunctival pallor oral mucosa is moist neck is supple  lymphadenopathy is noted in the cervical supraclavicular and axillary regions. Lungs: Clear to auscultation and percussion cardiovascular: Regular rate rhythm no murmurs gallops or rubs abdomen: Protuberant nontender nondistended spleen tip is palpable no hepatomegaly extremities: No edema clubbing or cyanosis neuro: Patient is alert oriented otherwise nonfocal.  ECOG PERFORMANCE STATUS: 0 - Asymptomatic  Blood pressure 118/72, pulse 69, temperature 97.9 F (36.6 C), temperature source Oral, resp. rate 20, height 6' (1.829 m), weight 214 lb 4.8 oz (97.206 kg).  LABORATORY DATA: Lab Results  Component Value Date   WBC 62.4* 11/24/2011   HGB 15.5 11/24/2011   HCT 48.0 11/24/2011   MCV 100.7* 11/24/2011   PLT 77* 11/24/2011      Chemistry      Component Value Date/Time   NA 138 11/18/2011 0830   K 4.7 11/18/2011 0830   CL 106 11/18/2011 0830   CO2 23 11/18/2011 0830   BUN 15 11/18/2011 0830   CREATININE 0.80 11/18/2011 0830      Component Value Date/Time   CALCIUM 9.4 11/18/2011  0830   ALKPHOS 65 08/22/2011 0904   AST 19 08/22/2011 0904   ALT 27 08/22/2011 0904   BILITOT 0.5 08/22/2011 0904    FINAL DIAGNOSIS Diagnosis Bone Marrow, Aspirate,Biopsy, and Clot, right iliac bone - HYPERCELLULAR BONE MARROW WITH EXTENSIVE INVOLVEMENT BY CHRONIC LYMPHOCYTIC LEUKEMIA. PERIPHERAL BLOOD: - CHRONIC LYMPHOCYTIC LEUKEMIA. Ryan Bruin MD Pathologist, Electronic Signature (Case signed 11/22/2011) GROSS AND MICROSCOPIC INFORMATION Specimen Clinical Information hx CLL [jl] Source Bone Marrow, Aspirate,Biopsy, and Clot, right iliac bone Microscopic LAB DATA: CBC performed on 11/18/11 shows: WBC 80.87 K/ul Neutrophils 5% HB 15.7 g/dl Lymphocytes 34% HCT 74.2 % Monocytes 1% MCV 96.0 fL Eosinophils 0% RDW 15.4 % Basophils 0% PLT 81 K/ul PERIPHERAL BLOOD SMEAR: The red blood cells display mild anisopoikilocytosis with minimal polychromasia. The white blood cells are markedly increased in number with lymphocytosis. The lymphocytes primarily consist of small lymphoid cells with generally high nuclear cytoplasmic ratio, dense chromatin with block type clumping, and inconspicuous nucleoli. There are numerous smudge cells in the 1 of 3 FINAL for Ryan Copeland [phone]) Microscopic(continued) background. A significant prolymphocytic component is not present. The platelets are decreased in number. BONE MARROW ASPIRATE: Erythroid precursors: Relatively decreased with progressive maturation. Granulocytic precursors: Relatively decreased with progressive maturation. Megakaryocytes: Scattered forms are seen displaying normal morphology. Lymphocytes/plasma cells: The lymphocytes are markedly increased in number representing 95% of all cells and consist of small lymphoid cells with high nuclear cytoplasmic ratio, dense chromatin and inconspicuous nucleoli. A significant prolymphocytic or large lymphoid cell component is not present. Significant plasma cell aggregates are not  present. TOUCH PREPARATIONS: Predominance of small lymphoid cells. CLOT and BIOPSY: The sections show 90% cellularity with extensive infiltration of the medullary space by numerous aggregates, interstitial infiltrates and diffuse sheets of primarily small lymphoid cells displaying high nuclear cytoplasmic ratio, dense chromatin and small to inconspicuous nucleoli. Scattered small proliferation centers are seen. Myeloid hematopoiesis appears decreased in the background. Immunohistochemical stains for CD20, CD79a, CD3, CD5, CD10 and cyclin D1 were performed with appropriate controls. The lymphoid population is stains positively ffor CD79a (B cell marker) with patchy weaker positivity for CD20. There is patchy weak coexpression of CD5. No CD10 or cyclin D1 expression is identified. IRON STAIN: Iron stains are performed on a bone marrow aspirate smear and section of clot. The controls stained appropriately. Storage Iron: Decreased. Ringed Sideroblasts: Absent. ADDITIONAL DATA / TESTING: Specimen was sent for cytogenetic analysis and a separate report will follow. Flow cytometric analysis of the lymphoid population ([id number]) shows a major B cell population expressing pan B cell antigens including CD20 and CD23 associated with coexpression of CD5. This population shows extremely dim/negative staining for surface immunoglobulin light chains. (BNS:gt, 11/21/11) Specimen Table Bone Marrow count  performed on 500 cells shows: Blasts: 0% Myeloid 3% Promyelocyts: 0% Myelocytes: 0% Erythroid 2% Metamyelocyts: 1% Bands: 1% Lymphocytes: 95% Neutrophils: 1% Eosinophils: 0% Plasma Cells: 0% Basophils: 0% Monocytes: 0% M:E ratio: 1.5 Gross Received in [facility] are tissue fragments which aggregate 0.4 x 0.4 x 0.1 cm. The specimen is submitted in toto. Received in a separate container in [facility] are two cores of bone measuring 2.2 and 2.8 cm in length and each 0.2 cm in diameter. The specimen is submitted  in toto following decalcification. (GP:eps 11/18/11) 2 of   RADIOGRAPHIC STUDIES:  No results found.  ASSESSMENT: 61 year old gentleman with  #1 CLL. A CBC done today now shows the count to be lower than it had been previously platelets are stable he does not have any evidence of autoimmune hemolytic anemia. I do think that he remains quite stable. The fact that he has not had any infections there is no fatigue there is no hemolysis I think we could continue to observe him for now. I have discussed this with the patient as well as his wife.  #2 patient is still smoking and he and I discussed this again and he is willing to look at options for trying to quit smoking.  PLAN:   #1 patient will be observed for now.  #2 I do suspect that eventually we will need to treat him sooner rather than later but I would like to delay as long as I can as long as he can tolerate observation only.  #3 I will refer him to smoking cessation classes.  #4 I will plan on seeing him back in one month's time or sooner if need arises   All questions were answered. The patient knows to call the clinic with any problems, questions or concerns. We can certainly see the patient much sooner if necessary.  I spent 25 minutes counseling the patient face to face. The total time spent in the appointment was 30 minutes.    Drue Second, MD Medical/Oncology [facility] [phone] (beeper) [phone] (Office)  12/26/2011, 12:43 PM

## 2011-12-26 NOTE — Patient Instructions (Addendum)
Continue watchful waiting  Smoking cessation referral  Xanax for anxiety  See me back in 1 month

## 2012-01-25 ENCOUNTER — Telehealth: Payer: Self-pay | Admitting: *Deleted

## 2012-01-25 ENCOUNTER — Other Ambulatory Visit (HOSPITAL_BASED_OUTPATIENT_CLINIC_OR_DEPARTMENT_OTHER): Payer: BC Managed Care – PPO | Admitting: Lab

## 2012-01-25 ENCOUNTER — Encounter: Payer: Self-pay | Admitting: Adult Health

## 2012-01-25 ENCOUNTER — Ambulatory Visit (HOSPITAL_BASED_OUTPATIENT_CLINIC_OR_DEPARTMENT_OTHER): Payer: BC Managed Care – PPO | Admitting: Adult Health

## 2012-01-25 ENCOUNTER — Telehealth: Payer: Self-pay | Admitting: Oncology

## 2012-01-25 VITALS — BP 116/72 | HR 64 | Temp 98.2°F | Resp 20 | Ht 72.0 in | Wt 219.0 lb

## 2012-01-25 DIAGNOSIS — C9110 Chronic lymphocytic leukemia of B-cell type not having achieved remission: Secondary | ICD-10-CM

## 2012-01-25 DIAGNOSIS — Z716 Tobacco abuse counseling: Secondary | ICD-10-CM

## 2012-01-25 DIAGNOSIS — F17200 Nicotine dependence, unspecified, uncomplicated: Secondary | ICD-10-CM

## 2012-01-25 LAB — CBC WITH DIFFERENTIAL/PLATELET
BASO%: 0.4 % (ref 0.0–2.0)
Eosinophils Absolute: 0.1 10*3/uL (ref 0.0–0.5)
LYMPH%: 94.1 % — ABNORMAL HIGH (ref 14.0–49.0)
MCHC: 32.2 g/dL (ref 32.0–36.0)
MONO#: 1.6 10*3/uL — ABNORMAL HIGH (ref 0.1–0.9)
MONO%: 1.5 % (ref 0.0–14.0)
NEUT#: 4.2 10*3/uL (ref 1.5–6.5)
RBC: 4.11 10*6/uL — ABNORMAL LOW (ref 4.20–5.82)
RDW: 14.9 % — ABNORMAL HIGH (ref 11.0–14.6)
WBC: 106.4 10*3/uL (ref 4.0–10.3)

## 2012-01-25 MED ORDER — VARENICLINE TARTRATE 0.5 MG PO TABS: 0.5000 mg | ORAL_TABLET | Freq: Two times a day (BID) | ORAL | Status: DC

## 2012-01-25 NOTE — Telephone Encounter (Signed)
Please as his pharmacy requests

## 2012-01-25 NOTE — Telephone Encounter (Signed)
Karin Golden faxed request for a new prescription for the Chantix 1 mg kit.  Insurance has rejected prescription as ordered.  The started kit has the first seven days worth of 0.5 mg tablets and enough chantix 1mg  tabs for fifty six days.  The chantix one mg tab kits are always a twenty-eight day supply.  The max quantity can only be an eighty-four day supply.  After the first starter kit is used provider needs to send a new order for the chantix 1 mg tabs.  Patient will receive the Chantix initial starter kit with today's order.  Will notify providers.

## 2012-01-25 NOTE — Progress Notes (Signed)
OFFICE PROGRESS NOTE  CC  [name], PA [address] [place] [address]  DIAGNOSIS: 61 year old gentleman with  #1 stage II CLL.  #2 polycythemia likely secondary now resolved.  PRIOR THERAPY: Observation  CURRENT THERAPY: Observation  INTERVAL HISTORY: Ryan Copeland 61 y.o. male returns for followup visit today. Today he is accompanied by his wife. He has had no b symptoms regarding his CLL.  His wife has brought a news paper article about t-cell depletion and the treatment of CLL.  He is feeling well and his labs remain around baseline.  He would like a new prescription for chantix.    MEDICAL HISTORY: Past Medical History  Diagnosis Date  . 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 Hyperlipidemia   . Tobacco abuse   . PVD (peripheral vascular disease)   . 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  MEDICATIONS:  Current Outpatient Prescriptions  Medication Sig Dispense Refill  . ALPRAZolam (XANAX) 0.25 MG tablet Take 1 po q 6 hours prn for anxiety  90 tablet  1  . aspirin 325 MG tablet Take 325 mg by mouth daily.        Marland Kitchen esomeprazole (NEXIUM) 40 MG capsule Take 1 capsule (40 mg total) by mouth 2 (two) times daily.  180 capsule  3  . metFORMIN (GLUCOPHAGE-XR) 500 MG 24 hr tablet Take 500 mg by mouth 2 (two) times daily.      . metoprolol succinate (TOPROL-XL) 50 MG 24 hr tablet Take 50 mg by mouth daily with breakfast. Take with or immediately following a meal.      . nitroGLYCERIN (NITROSTAT) 0.4 MG SL tablet Place 1 tablet (0.4 mg total) under the tongue every 5 (five) minutes as needed.  25 tablet  12  . prasugrel (EFFIENT) 10 MG TABS Take 10 mg by mouth daily with breakfast.      . pregabalin (LYRICA) 75 MG capsule Take 75-150 mg by mouth 4 (four) times daily. 1 cap three times daily and 2 caps at bedtime      . tetrahydrozoline 0.05 % ophthalmic solution Place 1 drop into both  eyes as needed. Red eyes      . topiramate (TOPAMAX) 25 MG capsule Take 25 mg by mouth 2 (two) times daily.      . traMADol (ULTRAM) 50 MG tablet Take 50 mg by mouth every 6 (six) hours as needed. For pain      . varenicline (CHANTIX) 0.5 MG tablet Take 1 tablet (0.5 mg total) by mouth 2 (two) times daily.  360 tablet  0    SURGICAL HISTORY:  Past Surgical History  Procedure Date  . Femoral stents   . Coronary stent placement May 2011  . Cholecystectomy 2007    REVIEW OF SYSTEMS:   General: fatigue (-), night sweats (-), fever (-), pain (-) Lymph: palpable nodes (-) HEENT: vision changes (-), mucositis (-), gum bleeding (-), epistaxis (-) Cardiovascular: chest pain (-), palpitations (-) Pulmonary: shortness of breath (-), dyspnea on exertion (-), cough (-), hemoptysis (-) GI:  Early satiety (-), melena (-), dysphagia (-), nausea/vomiting (-), diarrhea (-) GU: dysuria (-), hematuria (-), incontinence (-) Musculoskeletal: joint swelling (-), joint pain (-), back pain (-) Neuro: weakness (-), numbness (-), headache (-), confusion (-) Skin: Rash (-), lesions (-), dryness (-) Psych: depression (-), suicidal/homicidal ideation (-), feeling of hopelessness (-)   PHYSICAL EXAMINATION:  BP  116/72  Pulse 64  Temp 98.2 F (36.8 C)  Resp 20  Ht 6' (1.829 m)  Wt 219 lb (99.338 kg)  BMI 29.70 kg/m2 General: Patient is a well appearing male in no acute distress HEENT: PERRLA, sclerae anicteric no conjunctival pallor, MMM Neck: supple, no palpable adenopathy Lungs: clear to auscultation bilaterally, no wheezes, rhonchi, or rales Cardiovascular: regular rate rhythm, S1, S2, no murmurs, rubs or gallops Abdomen: Soft, non-tender, non-distended, normoactive bowel sounds, no HSM Extremities: warm and well perfused, no clubbing, cyanosis, or edema Skin: No rashes or lesions Neuro: Non-focal ECOG PERFORMANCE STATUS: 0 - Asymptomatic  LABORATORY DATA: Lab Results  Component Value Date    WBC 106.4* 01/25/2012   HGB 13.3 01/25/2012   HCT 41.3 01/25/2012   MCV 100.5* 01/25/2012   PLT 75* 01/25/2012      Chemistry      Component Value Date/Time   NA 139 12/26/2011 1216   NA 138 11/18/2011 0830   K 4.2 12/26/2011 1216   K 4.7 11/18/2011 0830   CL 107 12/26/2011 1216   CL 106 11/18/2011 0830   CO2 23 12/26/2011 1216   CO2 23 11/18/2011 0830   BUN 16.0 12/26/2011 1216   BUN 15 11/18/2011 0830   CREATININE 1.1 12/26/2011 1216   CREATININE 0.80 11/18/2011 0830      Component Value Date/Time   CALCIUM 9.5 12/26/2011 1216   CALCIUM 9.4 11/18/2011 0830   ALKPHOS 144 12/26/2011 1216   ALKPHOS 65 08/22/2011 0904   AST 61* 12/26/2011 1216   AST 19 08/22/2011 0904   ALT 142* 12/26/2011 1216   ALT 27 08/22/2011 0904   BILITOT 0.74 12/26/2011 1216   BILITOT 0.5 08/22/2011 0904    FINAL DIAGNOSIS Diagnosis Bone Marrow, Aspirate,Biopsy, and Clot, right iliac bone - HYPERCELLULAR BONE MARROW WITH EXTENSIVE INVOLVEMENT BY CHRONIC LYMPHOCYTIC LEUKEMIA. PERIPHERAL BLOOD: - CHRONIC LYMPHOCYTIC LEUKEMIA. [name] MD Pathologist, Electronic Signature (Case signed 11/22/2011) GROSS AND MICROSCOPIC INFORMATION Specimen Clinical Information hx CLL [jl] Source Bone Marrow, Aspirate,Biopsy, and Clot, right iliac bone Microscopic LAB DATA: CBC performed on 11/18/11 shows: WBC 80.87 K/ul Neutrophils 5% HB 15.7 g/dl Lymphocytes 21% HCT 30.8 % Monocytes 1% MCV 96.0 fL Eosinophils 0% RDW 15.4 % Basophils 0% PLT 81 K/ul PERIPHERAL BLOOD SMEAR: The red blood cells display mild anisopoikilocytosis with minimal polychromasia. The white blood cells are markedly increased in number with lymphocytosis. The lymphocytes primarily consist of small lymphoid cells with generally high nuclear cytoplasmic ratio, dense chromatin with block type clumping, and inconspicuous nucleoli. There are numerous smudge cells in the 1 of 3 FINAL for BREYDON, SENTERS [phone]) Microscopic(continued) background.  A significant prolymphocytic component is not present. The platelets are decreased in number. BONE MARROW ASPIRATE: Erythroid precursors: Relatively decreased with progressive maturation. Granulocytic precursors: Relatively decreased with progressive maturation. Megakaryocytes: Scattered forms are seen displaying normal morphology. Lymphocytes/plasma cells: The lymphocytes are markedly increased in number representing 95% of all cells and consist of small lymphoid cells with high nuclear cytoplasmic ratio, dense chromatin and inconspicuous nucleoli. A significant prolymphocytic or large lymphoid cell component is not present. Significant plasma cell aggregates are not present. TOUCH PREPARATIONS: Predominance of small lymphoid cells. CLOT and BIOPSY: The sections show 90% cellularity with extensive infiltration of the medullary space by numerous aggregates, interstitial infiltrates and diffuse sheets of primarily small lymphoid cells displaying high nuclear cytoplasmic ratio, dense chromatin and small to inconspicuous nucleoli. Scattered small proliferation centers are seen. Myeloid hematopoiesis appears decreased in the  background. Immunohistochemical stains for CD20, CD79a, CD3, CD5, CD10 and cyclin D1 were performed with appropriate controls. The lymphoid population is stains positively ffor CD79a (B cell marker) with patchy weaker positivity for CD20. There is patchy weak coexpression of CD5. No CD10 or cyclin D1 expression is identified. IRON STAIN: Iron stains are performed on a bone marrow aspirate smear and section of clot. The controls stained appropriately. Storage Iron: Decreased. Ringed Sideroblasts: Absent. ADDITIONAL DATA / TESTING: Specimen was sent for cytogenetic analysis and a separate report will follow. Flow cytometric analysis of the lymphoid population ([id number]) shows a major B cell population expressing pan B cell antigens including CD20 and CD23 associated with  coexpression of CD5. This population shows extremely dim/negative staining for surface immunoglobulin light chains. (BNS:gt, 11/21/11) Specimen Table Bone Marrow count performed on 500 cells shows: Blasts: 0% Myeloid 3% Promyelocyts: 0% Myelocytes: 0% Erythroid 2% Metamyelocyts: 1% Bands: 1% Lymphocytes: 95% Neutrophils: 1% Eosinophils: 0% Plasma Cells: 0% Basophils: 0% Monocytes: 0% M:E ratio: 1.5 Gross Received in [facility] are tissue fragments which aggregate 0.4 x 0.4 x 0.1 cm. The specimen is submitted in toto. Received in a separate container in [facility] are two cores of bone measuring 2.2 and 2.8 cm in length and each 0.2 cm in diameter. The specimen is submitted in toto following decalcification. (GP:eps 11/18/11) 2 of   RADIOGRAPHIC STUDIES:  No results found.  ASSESSMENT: 61 year old gentleman with  #1 CLL. A CBC done today now shows the count to be lower than it had been previously platelets are stable he does not have any evidence of autoimmune hemolytic anemia. I do think that he remains quite stable. The fact that he has not had any infections there is no fatigue there is no hemolysis I think we could continue to observe him for now. I have discussed this with the patient as well as his wife.  #2 patient is still smoking and he and I discussed this again and he is willing to look at options for trying to quit smoking.  PLAN:   #1 We will continue to observe.  I discussed this with him and his wife.  They are in agreement with this plan.    #2 I do suspect that eventually we will need to treat him sooner rather than later but I would like to delay as long as I can as long as he can tolerate observation only.  #3 I have prescribed Chantix for smoking cessation.   #4 I will plan on seeing him back in one month's time or sooner if need arises   All questions were answered. The patient knows to call the clinic with any problems, questions or concerns. We can certainly  see the patient much sooner if necessary.  I spent 25 minutes counseling the patient face to face. The total time spent in the appointment was 30 minutes.  This case was reviewed with Dr. Welton Flakes.   Cherie Ouch Lyn Hollingshead, NP Medical Oncology [facility] Phone: [phone]      01/25/2012, 4:25 PM

## 2012-01-25 NOTE — Telephone Encounter (Signed)
gve the pt his jan 2014 appt calendar °

## 2012-01-25 NOTE — Patient Instructions (Addendum)
Doing well.  We will continue to observe your white count.  We will see you back in one month.  Please call us if you have any questions or concerns.

## 2012-01-26 ENCOUNTER — Ambulatory Visit: Payer: BC Managed Care – PPO | Admitting: Oncology

## 2012-01-26 ENCOUNTER — Other Ambulatory Visit: Payer: BC Managed Care – PPO | Admitting: Lab

## 2012-01-30 ENCOUNTER — Ambulatory Visit: Payer: BC Managed Care – PPO | Admitting: Oncology

## 2012-02-24 ENCOUNTER — Ambulatory Visit: Payer: BC Managed Care – PPO | Admitting: Oncology

## 2012-02-27 ENCOUNTER — Ambulatory Visit (HOSPITAL_BASED_OUTPATIENT_CLINIC_OR_DEPARTMENT_OTHER): Payer: BC Managed Care – PPO | Admitting: Oncology

## 2012-02-27 ENCOUNTER — Encounter: Payer: Self-pay | Admitting: Oncology

## 2012-02-27 ENCOUNTER — Other Ambulatory Visit (HOSPITAL_BASED_OUTPATIENT_CLINIC_OR_DEPARTMENT_OTHER): Payer: BC Managed Care – PPO | Admitting: Lab

## 2012-02-27 ENCOUNTER — Telehealth: Payer: Self-pay | Admitting: *Deleted

## 2012-02-27 VITALS — BP 113/68 | HR 67 | Temp 97.9°F | Resp 20 | Ht 72.0 in | Wt 216.8 lb

## 2012-02-27 DIAGNOSIS — C9110 Chronic lymphocytic leukemia of B-cell type not having achieved remission: Secondary | ICD-10-CM

## 2012-02-27 DIAGNOSIS — F17200 Nicotine dependence, unspecified, uncomplicated: Secondary | ICD-10-CM

## 2012-02-27 LAB — CBC WITH DIFFERENTIAL/PLATELET
Basophils Absolute: 0.1 10*3/uL (ref 0.0–0.1)
EOS%: 0.2 % (ref 0.0–7.0)
HCT: 35 % — ABNORMAL LOW (ref 38.4–49.9)
HGB: 12.2 g/dL — ABNORMAL LOW (ref 13.0–17.1)
LYMPH%: 94.9 % — ABNORMAL HIGH (ref 14.0–49.0)
MCH: 34.2 pg — ABNORMAL HIGH (ref 27.2–33.4)
MCV: 98.4 fL — ABNORMAL HIGH (ref 79.3–98.0)
MONO%: 1.6 % (ref 0.0–14.0)
NEUT%: 3.2 % — ABNORMAL LOW (ref 39.0–75.0)
Platelets: 73 10*3/uL — ABNORMAL LOW (ref 140–400)
RDW: 14.9 % — ABNORMAL HIGH (ref 11.0–14.6)

## 2012-02-27 NOTE — Patient Instructions (Addendum)
Proceed with staging scans  i will see you back in 2 weeks

## 2012-02-27 NOTE — Telephone Encounter (Signed)
Gave patient appointment for two weeks with md gave patient instructions to get patients scans done

## 2012-03-08 ENCOUNTER — Encounter (HOSPITAL_COMMUNITY): Payer: Self-pay

## 2012-03-08 ENCOUNTER — Ambulatory Visit (HOSPITAL_COMMUNITY)
Admission: RE | Admit: 2012-03-08 | Discharge: 2012-03-08 | Disposition: A | Discharge status: Home / Self Care | Payer: BC Managed Care – PPO | Source: Ambulatory Visit | Attending: Oncology | Admitting: Oncology

## 2012-03-08 DIAGNOSIS — C9110 Chronic lymphocytic leukemia of B-cell type not having achieved remission: Secondary | ICD-10-CM | POA: Insufficient documentation

## 2012-03-08 DIAGNOSIS — R161 Splenomegaly, not elsewhere classified: Secondary | ICD-10-CM | POA: Insufficient documentation

## 2012-03-08 DIAGNOSIS — R599 Enlarged lymph nodes, unspecified: Secondary | ICD-10-CM | POA: Insufficient documentation

## 2012-03-08 MED ORDER — IOHEXOL 300 MG/ML  SOLN
125.0000 mL | Freq: Once | INTRAMUSCULAR | Status: AC | PRN
  Administered 2012-03-08: 125 mL via INTRAVENOUS

## 2012-03-10 NOTE — Progress Notes (Signed)
OFFICE PROGRESS NOTE  CC  Ryan Gemma, PA [address] [place] [address]  DIAGNOSIS: 62 year old gentleman with  #1 stage II CLL.  #2 polycythemia likely secondary now resolved.  PRIOR THERAPY: Observation  CURRENT THERAPY: Observation  INTERVAL HISTORY: Ryan Copeland 62 y.o. male returns for followup visit today. Today he is accompanied by his wife. He has had no b symptoms regarding his CLL.  His wife has brought a news paper article about t-cell depletion and the treatment of CLL.  He is feeling well and his labs remain around baseline.  He would like a new prescription for chantix.    MEDICAL HISTORY: Past Medical History  Diagnosis Date  . 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 Hyperlipidemia   . Tobacco abuse   . PVD (peripheral vascular disease)   . 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  MEDICATIONS:  Current Outpatient Prescriptions  Medication Sig Dispense Refill  . ALPRAZolam (XANAX) 0.25 MG tablet Take 1 po q 6 hours prn for anxiety  90 tablet  1  . aspirin 325 MG tablet Take 325 mg by mouth daily.        Marland Kitchen esomeprazole (NEXIUM) 40 MG capsule Take 1 capsule (40 mg total) by mouth 2 (two) times daily.  180 capsule  3  . metFORMIN (GLUCOPHAGE-XR) 500 MG 24 hr tablet Take 500 mg by mouth 2 (two) times daily.      . metoprolol succinate (TOPROL-XL) 50 MG 24 hr tablet Take 50 mg by mouth daily with breakfast. Take with or immediately following a meal.      . nitroGLYCERIN (NITROSTAT) 0.4 MG SL tablet Place 1 tablet (0.4 mg total) under the tongue every 5 (five) minutes as needed.  25 tablet  12  . prasugrel (EFFIENT) 10 MG TABS Take 10 mg by mouth daily with breakfast.      . pregabalin (LYRICA) 75 MG capsule Take 75-150 mg by mouth 4 (four) times daily. 1 cap three times daily and 2 caps at bedtime      . tetrahydrozoline 0.05 % ophthalmic solution Place 1 drop into both  eyes as needed. Red eyes      . topiramate (TOPAMAX) 25 MG capsule Take 25 mg by mouth 2 (two) times daily.      . traMADol (ULTRAM) 50 MG tablet Take 50 mg by mouth every 6 (six) hours as needed. For pain      . varenicline (CHANTIX) 0.5 MG tablet Take 1 tablet (0.5 mg total) by mouth 2 (two) times daily.  360 tablet  0    SURGICAL HISTORY:  Past Surgical History  Procedure Date  . Femoral stents   . Coronary stent placement May 2011  . Cholecystectomy 2007    REVIEW OF SYSTEMS:   General: fatigue (-), night sweats (-), fever (-), pain (-) Lymph: palpable nodes (-) HEENT: vision changes (-), mucositis (-), gum bleeding (-), epistaxis (-) Cardiovascular: chest pain (-), palpitations (-) Pulmonary: shortness of breath (-), dyspnea on exertion (-), cough (-), hemoptysis (-) GI:  Early satiety (-), melena (-), dysphagia (-), nausea/vomiting (-), diarrhea (-) GU: dysuria (-), hematuria (-), incontinence (-) Musculoskeletal: joint swelling (-), joint pain (-), back pain (-) Neuro: weakness (-), numbness (-), headache (-), confusion (-) Skin: Rash (-), lesions (-), dryness (-) Psych: depression (-), suicidal/homicidal ideation (-), feeling of hopelessness (-)   PHYSICAL EXAMINATION:  BP  113/68  Pulse 67  Temp 97.9 F (36.6 C)  Resp 20  Ht 6' (1.829 m)  Wt 216 lb 12.8 oz (98.34 kg)  BMI 29.40 kg/m2 General: Patient is a well appearing male in no acute distress HEENT: PERRLA, sclerae anicteric no conjunctival pallor, MMM Neck: supple, no palpable adenopathy Lungs: clear to auscultation bilaterally, no wheezes, rhonchi, or rales Cardiovascular: regular rate rhythm, S1, S2, no murmurs, rubs or gallops Abdomen: Soft, non-tender, non-distended, normoactive bowel sounds, no HSM Extremities: warm and well perfused, no clubbing, cyanosis, or edema Skin: No rashes or lesions Neuro: Non-focal ECOG PERFORMANCE STATUS: 0 - Asymptomatic  LABORATORY DATA: Lab Results  Component Value  Date   WBC 173.3* 02/27/2012   HGB 12.2* 02/27/2012   HCT 35.0* 02/27/2012   MCV 98.4* 02/27/2012   PLT 73* 02/27/2012      Chemistry      Component Value Date/Time   NA 139 12/26/2011 1216   NA 138 11/18/2011 0830   K 4.2 12/26/2011 1216   K 4.7 11/18/2011 0830   CL 107 12/26/2011 1216   CL 106 11/18/2011 0830   CO2 23 12/26/2011 1216   CO2 23 11/18/2011 0830   BUN 16.0 12/26/2011 1216   BUN 15 11/18/2011 0830   CREATININE 1.1 12/26/2011 1216   CREATININE 0.80 11/18/2011 0830      Component Value Date/Time   CALCIUM 9.5 12/26/2011 1216   CALCIUM 9.4 11/18/2011 0830   ALKPHOS 144 12/26/2011 1216   ALKPHOS 65 08/22/2011 0904   AST 61* 12/26/2011 1216   AST 19 08/22/2011 0904   ALT 142* 12/26/2011 1216   ALT 27 08/22/2011 0904   BILITOT 0.74 12/26/2011 1216   BILITOT 0.5 08/22/2011 0904    FINAL DIAGNOSIS Diagnosis Bone Marrow, Aspirate,Biopsy, and Clot, right iliac bone - HYPERCELLULAR BONE MARROW WITH EXTENSIVE INVOLVEMENT BY CHRONIC LYMPHOCYTIC LEUKEMIA. PERIPHERAL BLOOD: - CHRONIC LYMPHOCYTIC LEUKEMIA. Ryan Bruin MD Pathologist, Electronic Signature (Case signed 11/22/2011) GROSS AND MICROSCOPIC INFORMATION Specimen Clinical Information hx CLL [jl] Source Bone Marrow, Aspirate,Biopsy, and Clot, right iliac bone Microscopic LAB DATA: CBC performed on 11/18/11 shows: WBC 80.87 K/ul Neutrophils 5% HB 15.7 g/dl Lymphocytes 16% HCT 10.9 % Monocytes 1% MCV 96.0 fL Eosinophils 0% RDW 15.4 % Basophils 0% PLT 81 K/ul PERIPHERAL BLOOD SMEAR: The red blood cells display mild anisopoikilocytosis with minimal polychromasia. The white blood cells are markedly increased in number with lymphocytosis. The lymphocytes primarily consist of small lymphoid cells with generally high nuclear cytoplasmic ratio, dense chromatin with block type clumping, and inconspicuous nucleoli. There are numerous smudge cells in the 1 of 3 FINAL for Ryan Copeland, Ryan Copeland  [phone]) Microscopic(continued) background. A significant prolymphocytic component is not present. The platelets are decreased in number. BONE MARROW ASPIRATE: Erythroid precursors: Relatively decreased with progressive maturation. Granulocytic precursors: Relatively decreased with progressive maturation. Megakaryocytes: Scattered forms are seen displaying normal morphology. Lymphocytes/plasma cells: The lymphocytes are markedly increased in number representing 95% of all cells and consist of small lymphoid cells with high nuclear cytoplasmic ratio, dense chromatin and inconspicuous nucleoli. A significant prolymphocytic or large lymphoid cell component is not present. Significant plasma cell aggregates are not present. TOUCH PREPARATIONS: Predominance of small lymphoid cells. CLOT and BIOPSY: The sections show 90% cellularity with extensive infiltration of the medullary space by numerous aggregates, interstitial infiltrates and diffuse sheets of primarily small lymphoid cells displaying high nuclear cytoplasmic ratio, dense chromatin and small to inconspicuous nucleoli. Scattered small proliferation centers are seen. Myeloid hematopoiesis appears decreased  in the background. Immunohistochemical stains for CD20, CD79a, CD3, CD5, CD10 and cyclin D1 were performed with appropriate controls. The lymphoid population is stains positively ffor CD79a (B cell marker) with patchy weaker positivity for CD20. There is patchy weak coexpression of CD5. No CD10 or cyclin D1 expression is identified. IRON STAIN: Iron stains are performed on a bone marrow aspirate smear and section of clot. The controls stained appropriately. Storage Iron: Decreased. Ringed Sideroblasts: Absent. ADDITIONAL DATA / TESTING: Specimen was sent for cytogenetic analysis and a separate report will follow. Flow cytometric analysis of the lymphoid population ([id number]) shows a major B cell population expressing pan B cell  antigens including CD20 and CD23 associated with coexpression of CD5. This population shows extremely dim/negative staining for surface immunoglobulin light chains. (BNS:gt, 11/21/11) Specimen Table Bone Marrow count performed on 500 cells shows: Blasts: 0% Myeloid 3% Promyelocyts: 0% Myelocytes: 0% Erythroid 2% Metamyelocyts: 1% Bands: 1% Lymphocytes: 95% Neutrophils: 1% Eosinophils: 0% Plasma Cells: 0% Basophils: 0% Monocytes: 0% M:E ratio: 1.5 Gross Received in [facility] are tissue fragments which aggregate 0.4 x 0.4 x 0.1 cm. The specimen is submitted in toto. Received in a separate container in [facility] are two cores of bone measuring 2.2 and 2.8 cm in length and each 0.2 cm in diameter. The specimen is submitted in toto following decalcification. (GP:eps 11/18/11) 2 of   RADIOGRAPHIC STUDIES:  No results found.  ASSESSMENT: 62 year old gentleman with  #1 CLL. A CBC done today now shows the count to be lower than it had been previously platelets are stable he does not have any evidence of autoimmune hemolytic anemia. I do think that he remains quite stable. The fact that he has not had any infections there is no fatigue there is no hemolysis I think we could continue to observe him for now. I have discussed this with the patient as well as his wife.  #2 patient is still smoking and he and I discussed this again and he is willing to look at options for trying to quit smoking.  PLAN:    #1 we discussed the significance of the elevation in the white count. I do think that he will need to be treated in the next few months. However prior to doing that I will obtain staging scans including a CT of the neck chest abdomen and pelvis. I will plan on seeing him back after that.  All questions were answered. The patient knows to call the clinic with any problems, questions or concerns. We can certainly see the patient much sooner if necessary.  I spent 25 minutes counseling the patient  face to face. The total time spent in the appointment was 30 minutes.  Drue Second, MD Medical/Oncology [facility] [phone] (beeper) [phone] (Office)

## 2012-03-12 ENCOUNTER — Encounter: Payer: Self-pay | Admitting: Oncology

## 2012-03-12 ENCOUNTER — Ambulatory Visit (HOSPITAL_BASED_OUTPATIENT_CLINIC_OR_DEPARTMENT_OTHER): Payer: BC Managed Care – PPO | Admitting: Oncology

## 2012-03-12 ENCOUNTER — Other Ambulatory Visit (HOSPITAL_BASED_OUTPATIENT_CLINIC_OR_DEPARTMENT_OTHER): Payer: BC Managed Care – PPO

## 2012-03-12 ENCOUNTER — Telehealth: Payer: Self-pay | Admitting: Oncology

## 2012-03-12 VITALS — BP 115/71 | HR 67 | Temp 98.1°F | Resp 20 | Ht 72.0 in | Wt 214.8 lb

## 2012-03-12 DIAGNOSIS — C9110 Chronic lymphocytic leukemia of B-cell type not having achieved remission: Secondary | ICD-10-CM

## 2012-03-12 DIAGNOSIS — R161 Splenomegaly, not elsewhere classified: Secondary | ICD-10-CM

## 2012-03-12 DIAGNOSIS — R599 Enlarged lymph nodes, unspecified: Secondary | ICD-10-CM

## 2012-03-12 LAB — CBC WITH DIFFERENTIAL/PLATELET
BASO%: 0.4 % (ref 0.0–2.0)
Eosinophils Absolute: 0.2 10*3/uL (ref 0.0–0.5)
LYMPH%: 94.2 % — ABNORMAL HIGH (ref 14.0–49.0)
MCHC: 31.6 g/dL — ABNORMAL LOW (ref 32.0–36.0)
MONO#: 2.1 10*3/uL — ABNORMAL HIGH (ref 0.1–0.9)
NEUT#: 5.2 10*3/uL (ref 1.5–6.5)
Platelets: 67 10*3/uL — ABNORMAL LOW (ref 140–400)
RBC: 3.92 10*6/uL — ABNORMAL LOW (ref 4.20–5.82)
WBC: 138.3 10*3/uL (ref 4.0–10.3)
lymph#: 130.4 10*3/uL — ABNORMAL HIGH (ref 0.9–3.3)

## 2012-03-12 LAB — TECHNOLOGIST REVIEW

## 2012-03-12 NOTE — Telephone Encounter (Signed)
appts made and printed for pt aom °

## 2012-03-12 NOTE — Progress Notes (Signed)
OFFICE PROGRESS NOTE  CC  [name], PA [address] [place] [address]  DIAGNOSIS: 62 year old gentleman with  #1 stage II CLL.  #2 polycythemia likely secondary now resolved.  PRIOR THERAPY: Observation  CURRENT THERAPY: Observation  INTERVAL HISTORY: Ryan Copeland 62 y.o. male returns for followup visit today. Today he is accompanied by his wife. He has had no b symptoms regarding his CLL.Marland Kitchen  He is feeling well and his labs remain around baseline.  He would like a new prescription for chantix.    MEDICAL HISTORY: Past Medical History  Diagnosis Date  . 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 Hyperlipidemia   . Tobacco abuse   . PVD (peripheral vascular disease)   . 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  MEDICATIONS:  Current Outpatient Prescriptions  Medication Sig Dispense Refill  . ALPRAZolam (XANAX) 0.25 MG tablet Take 1 po q 6 hours prn for anxiety  90 tablet  1  . aspirin 325 MG tablet Take 325 mg by mouth daily.        Marland Kitchen esomeprazole (NEXIUM) 40 MG capsule Take 1 capsule (40 mg total) by mouth 2 (two) times daily.  180 capsule  3  . metFORMIN (GLUCOPHAGE-XR) 500 MG 24 hr tablet Take 500 mg by mouth 2 (two) times daily.      . metoprolol succinate (TOPROL-XL) 50 MG 24 hr tablet Take 50 mg by mouth daily with breakfast. Take with or immediately following a meal.      . prasugrel (EFFIENT) 10 MG TABS Take 10 mg by mouth daily with breakfast.      . pregabalin (LYRICA) 75 MG capsule Take 75-150 mg by mouth 4 (four) times daily. 1 cap three times daily and 2 caps at bedtime      . tetrahydrozoline 0.05 % ophthalmic solution Place 1 drop into both eyes as needed. Red eyes      . topiramate (TOPAMAX) 25 MG capsule Take 25 mg by mouth 2 (two) times daily.      . traMADol (ULTRAM) 50 MG tablet Take 50 mg by mouth every 6 (six) hours as needed. For pain      . nitroGLYCERIN  (NITROSTAT) 0.4 MG SL tablet Place 1 tablet (0.4 mg total) under the tongue every 5 (five) minutes as needed.  25 tablet  12  . varenicline (CHANTIX) 0.5 MG tablet Take 1 tablet (0.5 mg total) by mouth 2 (two) times daily.  360 tablet  0    SURGICAL HISTORY:  Past Surgical History  Procedure Date  . Femoral stents   . Coronary stent placement May 2011  . Cholecystectomy 2007    REVIEW OF SYSTEMS:   General: fatigue (-), night sweats (-), fever (-), pain (-) Lymph: palpable nodes (-) HEENT: vision changes (-), mucositis (-), gum bleeding (-), epistaxis (-) Cardiovascular: chest pain (-), palpitations (-) Pulmonary: shortness of breath (-), dyspnea on exertion (-), cough (-), hemoptysis (-) GI:  Early satiety (-), melena (-), dysphagia (-), nausea/vomiting (-), diarrhea (-) GU: dysuria (-), hematuria (-), incontinence (-) Musculoskeletal: joint swelling (-), joint pain (-), back pain (-) Neuro: weakness (-), numbness (-), headache (-), confusion (-) Skin: Rash (-), lesions (-), dryness (-) Psych: depression (-), suicidal/homicidal ideation (-), feeling of hopelessness (-)   PHYSICAL EXAMINATION:  BP 115/71  Pulse 67  Temp 98.1 F (36.7 C) (Oral)  Resp 20  Ht 6' (  1.829 m)  Wt 214 lb 12.8 oz (97.433 kg)  BMI 29.13 kg/m2 General: Patient is a well appearing male in no acute distress HEENT: PERRLA, sclerae anicteric no conjunctival pallor, MMM Neck: supple, no palpable adenopathy Lungs: clear to auscultation bilaterally, no wheezes, rhonchi, or rales Cardiovascular: regular rate rhythm, S1, S2, no murmurs, rubs or gallops Abdomen: Soft, non-tender, non-distended, normoactive bowel sounds, no HSM Extremities: warm and well perfused, no clubbing, cyanosis, or edema Skin: No rashes or lesions Neuro: Non-focal ECOG PERFORMANCE STATUS: 0 - Asymptomatic  LABORATORY DATA: Lab Results  Component Value Date   WBC 138.3* 03/12/2012   HGB 12.8* 03/12/2012   HCT 40.4 03/12/2012   MCV  103.0* 03/12/2012   PLT 67* 03/12/2012      Chemistry      Component Value Date/Time   NA 139 12/26/2011 1216   NA 138 11/18/2011 0830   K 4.2 12/26/2011 1216   K 4.7 11/18/2011 0830   CL 107 12/26/2011 1216   CL 106 11/18/2011 0830   CO2 23 12/26/2011 1216   CO2 23 11/18/2011 0830   BUN 16.0 12/26/2011 1216   BUN 15 11/18/2011 0830   CREATININE 1.1 12/26/2011 1216   CREATININE 0.80 11/18/2011 0830      Component Value Date/Time   CALCIUM 9.5 12/26/2011 1216   CALCIUM 9.4 11/18/2011 0830   ALKPHOS 144 12/26/2011 1216   ALKPHOS 65 08/22/2011 0904   AST 61* 12/26/2011 1216   AST 19 08/22/2011 0904   ALT 142* 12/26/2011 1216   ALT 27 08/22/2011 0904   BILITOT 0.74 12/26/2011 1216   BILITOT 0.5 08/22/2011 0904    FINAL DIAGNOSIS Diagnosis Bone Marrow, Aspirate,Biopsy, and Clot, right iliac bone - HYPERCELLULAR BONE MARROW WITH EXTENSIVE INVOLVEMENT BY CHRONIC LYMPHOCYTIC LEUKEMIA. PERIPHERAL BLOOD: - CHRONIC LYMPHOCYTIC LEUKEMIA. [name] MD Pathologist, Electronic Signature (Case signed 11/22/2011) GROSS AND MICROSCOPIC INFORMATION Specimen Clinical Information hx CLL [jl] Source Bone Marrow, Aspirate,Biopsy, and Clot, right iliac bone Microscopic LAB DATA: CBC performed on 11/18/11 shows: WBC 80.87 K/ul Neutrophils 5% HB 15.7 g/dl Lymphocytes 16% HCT 10.9 % Monocytes 1% MCV 96.0 fL Eosinophils 0% RDW 15.4 % Basophils 0% PLT 81 K/ul PERIPHERAL BLOOD SMEAR: The red blood cells display mild anisopoikilocytosis with minimal polychromasia. The white blood cells are markedly increased in number with lymphocytosis. The lymphocytes primarily consist of small lymphoid cells with generally high nuclear cytoplasmic ratio, dense chromatin with block type clumping, and inconspicuous nucleoli. There are numerous smudge cells in the 1 of 3 FINAL for TRACY, KINNER [phone]) Microscopic(continued) background. A significant prolymphocytic component is not present. The platelets are  decreased in number. BONE MARROW ASPIRATE: Erythroid precursors: Relatively decreased with progressive maturation. Granulocytic precursors: Relatively decreased with progressive maturation. Megakaryocytes: Scattered forms are seen displaying normal morphology. Lymphocytes/plasma cells: The lymphocytes are markedly increased in number representing 95% of all cells and consist of small lymphoid cells with high nuclear cytoplasmic ratio, dense chromatin and inconspicuous nucleoli. A significant prolymphocytic or large lymphoid cell component is not present. Significant plasma cell aggregates are not present. TOUCH PREPARATIONS: Predominance of small lymphoid cells. CLOT and BIOPSY: The sections show 90% cellularity with extensive infiltration of the medullary space by numerous aggregates, interstitial infiltrates and diffuse sheets of primarily small lymphoid cells displaying high nuclear cytoplasmic ratio, dense chromatin and small to inconspicuous nucleoli. Scattered small proliferation centers are seen. Myeloid hematopoiesis appears decreased in the background. Immunohistochemical stains for CD20, CD79a, CD3, CD5, CD10 and cyclin D1 were performed  with appropriate controls. The lymphoid population is stains positively ffor CD79a (B cell marker) with patchy weaker positivity for CD20. There is patchy weak coexpression of CD5. No CD10 or cyclin D1 expression is identified. IRON STAIN: Iron stains are performed on a bone marrow aspirate smear and section of clot. The controls stained appropriately. Storage Iron: Decreased. Ringed Sideroblasts: Absent. ADDITIONAL DATA / TESTING: Specimen was sent for cytogenetic analysis and a separate report will follow. Flow cytometric analysis of the lymphoid population ([id number]) shows a major B cell population expressing pan B cell antigens including CD20 and CD23 associated with coexpression of CD5. This population shows extremely dim/negative staining for  surface immunoglobulin light chains. (BNS:gt, 11/21/11) Specimen Table Bone Marrow count performed on 500 cells shows: Blasts: 0% Myeloid 3% Promyelocyts: 0% Myelocytes: 0% Erythroid 2% Metamyelocyts: 1% Bands: 1% Lymphocytes: 95% Neutrophils: 1% Eosinophils: 0% Plasma Cells: 0% Basophils: 0% Monocytes: 0% M:E ratio: 1.5 Gross Received in [facility] are tissue fragments which aggregate 0.4 x 0.4 x 0.1 cm. The specimen is submitted in toto. Received in a separate container in [facility] are two cores of bone measuring 2.2 and 2.8 cm in length and each 0.2 cm in diameter. The specimen is submitted in toto following decalcification. (GP:eps 11/18/11) 2 of   RADIOGRAPHIC STUDIES: CT CHEST  Findings: Bilateral axillary lymphadenopathy is slightly  progressed compared to prior. For example, index node in the  right axilla measures 15 mm short axis (image 14) compared to 9 mm  on prior. Adjacent rounded 20 mm lymph node (#8) compares to 13 mm  on prior. There are clustered small supraclavicular lymph nodes  which also appears slightly enlarged. Within the mediastinum small  paratracheal lymph nodes are similar. No pericardial fluid.  Esophagus is normal. Coronary artery stent is noted.  Review of the lung parenchyma demonstrates no new suspicious  pulmonary nodules.  IMPRESSION:  1. Mild continued progressive enlargement of axillary  lymphadenopathy.  2. Stable supraclavicular and mediastinal adenopathy.  CT ABDOMEN AND PELVIS  Findings: Retroperitoneal lymphadenopathy is slightly progressed  compared to prior. For example conglomerate of nodes left of the  aorta measures 26 mm short axis compared to 22 mm on prior (image  71). Periportal lymph node measures 23 mm compared to 20 mm on  prior.  The spleen is massively enlarged with a calculated volume of 2362  ml compared to 1972 ml on prior.  Bilateral external iliac lymph nodes are increased in volume. Left  node measures 19mm short  axis compared to 13 on prior. Inguinal  adenopathy is also increased.  The liver, liver is normal. Gallbladder is absent. The spleen,  adrenal glands, kidneys are normal.  The colon rectosigmoid colon are normal.  The bladder prostate gland normal. No pelvic fluid.  IMPRESSION:  1. Progressive increase in retroperitoneal, iliac, and inguinal  lymphadenopathy.  2. Interval increase in massive splenomegaly.     ASSESSMENT: 62 year old gentleman with  #1 CLL. A CBC done today now shows the count to be lower than it had been previously platelets are lower,  he does not have any evidence of autoimmune hemolytic anemia. I do think that he remains quite stable. The fact that he has not had any infections there is no fatigue there is no hemolysis I think we could continue to observe him for now. I have discussed this with the patient as well as his wife.  #2 CT scans do reveal some progressive lymphadenopathy. Also he does have massive splenomegaly which is concerning.  We discussed symptoms of splenic rupture. He knows to call me with any problems.Marland Kitchen  PLAN:  #1 I will plan on seeing the patient back in one month's time. We will defer treatment at this time.  #2 the nodal call me with any problems  All questions were answered. The patient knows to call the clinic with any problems, questions or concerns. We can certainly see the patient much sooner if necessary.  I spent 25 minutes counseling the patient face to face. The total time spent in the appointment was 30 minutes.  Drue Second, MD Medical/Oncology [facility] [phone] (beeper) [phone] (Office)

## 2012-03-12 NOTE — Patient Instructions (Addendum)
Continue to monitor counts  I will see you back in 1 month

## 2012-03-31 ENCOUNTER — Other Ambulatory Visit: Payer: Self-pay

## 2012-04-02 ENCOUNTER — Ambulatory Visit: Payer: BC Managed Care – PPO | Admitting: Oncology

## 2012-04-16 ENCOUNTER — Other Ambulatory Visit (HOSPITAL_BASED_OUTPATIENT_CLINIC_OR_DEPARTMENT_OTHER): Payer: BC Managed Care – PPO | Admitting: Lab

## 2012-04-16 ENCOUNTER — Telehealth: Payer: Self-pay | Admitting: Oncology

## 2012-04-16 ENCOUNTER — Ambulatory Visit (HOSPITAL_BASED_OUTPATIENT_CLINIC_OR_DEPARTMENT_OTHER): Payer: BC Managed Care – PPO | Admitting: Oncology

## 2012-04-16 ENCOUNTER — Encounter: Payer: Self-pay | Admitting: Oncology

## 2012-04-16 VITALS — BP 112/70 | HR 64 | Temp 97.4°F | Resp 20 | Ht 72.0 in | Wt 215.5 lb

## 2012-04-16 DIAGNOSIS — R161 Splenomegaly, not elsewhere classified: Secondary | ICD-10-CM

## 2012-04-16 DIAGNOSIS — D696 Thrombocytopenia, unspecified: Secondary | ICD-10-CM

## 2012-04-16 DIAGNOSIS — R599 Enlarged lymph nodes, unspecified: Secondary | ICD-10-CM

## 2012-04-16 LAB — CBC WITH DIFFERENTIAL/PLATELET
Basophils Absolute: 0.4 10*3/uL — ABNORMAL HIGH (ref 0.0–0.1)
EOS%: 0.1 % (ref 0.0–7.0)
Eosinophils Absolute: 0.1 10*3/uL (ref 0.0–0.5)
HCT: 37.1 % — ABNORMAL LOW (ref 38.4–49.9)
HGB: 12.6 g/dL — ABNORMAL LOW (ref 13.0–17.1)
MCH: 33.4 pg (ref 27.2–33.4)
MONO#: 2.3 10*3/uL — ABNORMAL HIGH (ref 0.1–0.9)
NEUT#: 4.9 10*3/uL (ref 1.5–6.5)
NEUT%: 3 % — ABNORMAL LOW (ref 39.0–75.0)
RDW: 15.1 % — ABNORMAL HIGH (ref 11.0–14.6)
WBC: 163.8 10*3/uL (ref 4.0–10.3)
lymph#: 156 10*3/uL — ABNORMAL HIGH (ref 0.9–3.3)

## 2012-04-16 LAB — IGG, IGA, IGM
IgA: 75 mg/dL (ref 68–379)
IgM, Serum: 17 mg/dL — ABNORMAL LOW (ref 41–251)

## 2012-04-16 LAB — COMPREHENSIVE METABOLIC PANEL (CC13)
ALT: 77 U/L — ABNORMAL HIGH (ref 0–55)
Alkaline Phosphatase: 139 U/L (ref 40–150)
CO2: 24 mEq/L (ref 22–29)
Creatinine: 1.1 mg/dL (ref 0.7–1.3)
Sodium: 140 mEq/L (ref 136–145)
Total Bilirubin: 0.66 mg/dL (ref 0.20–1.20)
Total Protein: 6.1 g/dL — ABNORMAL LOW (ref 6.4–8.3)

## 2012-04-16 LAB — DIRECT ANTIGLOBULIN TEST (NOT AT ARMC)
DAT (Complement): NEGATIVE
DAT IgG: NEGATIVE

## 2012-04-16 LAB — HAPTOGLOBIN: Haptoglobin: 97 mg/dL (ref 45–215)

## 2012-04-16 NOTE — Telephone Encounter (Signed)
gv pt appt schedule for April.  °

## 2012-04-16 NOTE — Progress Notes (Signed)
OFFICE PROGRESS NOTE  CC  Ryan Gemma, PA [address] [place] [address]  DIAGNOSIS: 62 year old gentleman with  #1 stage II CLL.  #2 polycythemia likely secondary now resolved.  PRIOR THERAPY: Observation  CURRENT THERAPY: Observation  INTERVAL HISTORY: Ryan Copeland 62 y.o. male returns for followup visit today. Today he is accompanied by his wife. He has had no b symptoms regarding his CLL.Marland Kitchen  He is feeling well and his labs remain around baseline.  He would like a new prescription for chantix.  Patient's counts are elevated again today 163.8 with platelets lower at 58,000 hemoglobin is relatively stable at 12.6. He has no evidence of hemolysis.exam does show significant lymphadenopathy and splenomegaly  MEDICAL HISTORY: Past Medical History  Diagnosis Date  . 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 Hyperlipidemia   . Tobacco abuse   . PVD (peripheral vascular disease)   . 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  MEDICATIONS:  Current Outpatient Prescriptions  Medication Sig Dispense Refill  . ALPRAZolam (XANAX) 0.25 MG tablet Take 1 po q 6 hours prn for anxiety  90 tablet  1  . aspirin 325 MG tablet Take 325 mg by mouth daily.        Marland Kitchen esomeprazole (NEXIUM) 40 MG capsule Take 1 capsule (40 mg total) by mouth 2 (two) times daily.  180 capsule  3  . metFORMIN (GLUCOPHAGE-XR) 500 MG 24 hr tablet Take 500 mg by mouth 2 (two) times daily.      . metoprolol succinate (TOPROL-XL) 50 MG 24 hr tablet Take 50 mg by mouth daily with breakfast. Take with or immediately following a meal.      . nitroGLYCERIN (NITROSTAT) 0.4 MG SL tablet Place 1 tablet (0.4 mg total) under the tongue every 5 (five) minutes as needed.  25 tablet  12  . prasugrel (EFFIENT) 10 MG TABS Take 10 mg by mouth daily with breakfast.      . pregabalin (LYRICA) 75 MG capsule Take 75-150 mg by mouth 4 (four) times daily.  1 cap three times daily and 2 caps at bedtime      . tetrahydrozoline 0.05 % ophthalmic solution Place 1 drop into both eyes as needed. Red eyes      . topiramate (TOPAMAX) 25 MG capsule Take 25 mg by mouth 2 (two) times daily.      . traMADol (ULTRAM) 50 MG tablet Take 50 mg by mouth every 6 (six) hours as needed. For pain      . varenicline (CHANTIX) 0.5 MG tablet Take 1 tablet (0.5 mg total) by mouth 2 (two) times daily.  360 tablet  0   No current facility-administered medications for this visit.    SURGICAL HISTORY:  Past Surgical History  Procedure Laterality Date  . Femoral stents    . Coronary stent placement  May 2011  . Cholecystectomy  2007    REVIEW OF SYSTEMS:   General: fatigue (-), night sweats (-), fever (-), pain (-) Lymph: palpable nodes (-) HEENT: vision changes (-), mucositis (-), gum bleeding (-), epistaxis (-) Cardiovascular: chest pain (-), palpitations (-) Pulmonary: shortness of breath (-), dyspnea on exertion (-), cough (-), hemoptysis (-) GI:  Early satiety (-), melena (-), dysphagia (-), nausea/vomiting (-), diarrhea (-) GU: dysuria (-), hematuria (-), incontinence (-) Musculoskeletal: joint swelling (-), joint pain (-), back pain (-) Neuro: weakness (-), numbness (-), headache (-),  confusion (-) Skin: Rash (-), lesions (-), dryness (-) Psych: depression (-), suicidal/homicidal ideation (-), feeling of hopelessness (-)   PHYSICAL EXAMINATION:  BP 112/70  Pulse 64  Temp(Src) 97.4 F (36.3 C) (Oral)  Resp 20  Ht 6' (1.829 m)  Wt 215 lb 8 oz (97.75 kg)  BMI 29.22 kg/m2 General: Patient is a well appearing male in no acute distress HEENT: PERRLA, sclerae anicteric no conjunctival pallor, MMM Neck: supple, no palpable adenopathy Lungs: clear to auscultation bilaterally, no wheezes, rhonchi, or rales Cardiovascular: regular rate rhythm, S1, S2, no murmurs, rubs or gallops Abdomen: Soft, non-tender, non-distended, normoactive bowel sounds, no  HSM Extremities: warm and well perfused, no clubbing, cyanosis, or edema Skin: No rashes or lesions Neuro: Non-focal ECOG PERFORMANCE STATUS: 0 - Asymptomatic  LABORATORY DATA: Lab Results  Component Value Date   WBC 163.8* 04/16/2012   HGB 12.6* 04/16/2012   HCT 37.1* 04/16/2012   MCV 98.3* 04/16/2012   PLT 58* 04/16/2012      Chemistry      Component Value Date/Time   NA 139 12/26/2011 1216   NA 138 11/18/2011 0830   K 4.2 12/26/2011 1216   K 4.7 11/18/2011 0830   CL 107 12/26/2011 1216   CL 106 11/18/2011 0830   CO2 23 12/26/2011 1216   CO2 23 11/18/2011 0830   BUN 16.0 12/26/2011 1216   BUN 15 11/18/2011 0830   CREATININE 1.1 12/26/2011 1216   CREATININE 0.80 11/18/2011 0830      Component Value Date/Time   CALCIUM 9.5 12/26/2011 1216   CALCIUM 9.4 11/18/2011 0830   ALKPHOS 144 12/26/2011 1216   ALKPHOS 65 08/22/2011 0904   AST 61* 12/26/2011 1216   AST 19 08/22/2011 0904   ALT 142* 12/26/2011 1216   ALT 27 08/22/2011 0904   BILITOT 0.74 12/26/2011 1216   BILITOT 0.5 08/22/2011 0904    FINAL DIAGNOSIS Diagnosis Bone Marrow, Aspirate,Biopsy, and Clot, right iliac bone - HYPERCELLULAR BONE MARROW WITH EXTENSIVE INVOLVEMENT BY CHRONIC LYMPHOCYTIC LEUKEMIA. PERIPHERAL BLOOD: - CHRONIC LYMPHOCYTIC LEUKEMIA. [name] MD Pathologist, Electronic Signature (Case signed 11/22/2011) GROSS AND MICROSCOPIC INFORMATION Specimen Clinical Information hx CLL [jl] Source Bone Marrow, Aspirate,Biopsy, and Clot, right iliac bone Microscopic LAB DATA: CBC performed on 11/18/11 shows: WBC 80.87 K/ul Neutrophils 5% HB 15.7 g/dl Lymphocytes 13% HCT 08.6 % Monocytes 1% MCV 96.0 fL Eosinophils 0% RDW 15.4 % Basophils 0% PLT 81 K/ul PERIPHERAL BLOOD SMEAR: The red blood cells display mild anisopoikilocytosis with minimal polychromasia. The white blood cells are markedly increased in number with lymphocytosis. The lymphocytes primarily consist of small lymphoid cells with generally high  nuclear cytoplasmic ratio, dense chromatin with block type clumping, and inconspicuous nucleoli. There are numerous smudge cells in the 1 of 3 FINAL for Ryan Copeland [phone]) Microscopic(continued) background. A significant prolymphocytic component is not present. The platelets are decreased in number. BONE MARROW ASPIRATE: Erythroid precursors: Relatively decreased with progressive maturation. Granulocytic precursors: Relatively decreased with progressive maturation. Megakaryocytes: Scattered forms are seen displaying normal morphology. Lymphocytes/plasma cells: The lymphocytes are markedly increased in number representing 95% of all cells and consist of small lymphoid cells with high nuclear cytoplasmic ratio, dense chromatin and inconspicuous nucleoli. A significant prolymphocytic or large lymphoid cell component is not present. Significant plasma cell aggregates are not present. TOUCH PREPARATIONS: Predominance of small lymphoid cells. CLOT and BIOPSY: The sections show 90% cellularity with extensive infiltration of the medullary space by numerous aggregates, interstitial infiltrates and diffuse sheets of  primarily small lymphoid cells displaying high nuclear cytoplasmic ratio, dense chromatin and small to inconspicuous nucleoli. Scattered small proliferation centers are seen. Myeloid hematopoiesis appears decreased in the background. Immunohistochemical stains for CD20, CD79a, CD3, CD5, CD10 and cyclin D1 were performed with appropriate controls. The lymphoid population is stains positively ffor CD79a (B cell marker) with patchy weaker positivity for CD20. There is patchy weak coexpression of CD5. No CD10 or cyclin D1 expression is identified. IRON STAIN: Iron stains are performed on a bone marrow aspirate smear and section of clot. The controls stained appropriately. Storage Iron: Decreased. Ringed Sideroblasts: Absent. ADDITIONAL DATA / TESTING: Specimen was sent for cytogenetic  analysis and a separate report will follow. Flow cytometric analysis of the lymphoid population ([id number]) shows a major B cell population expressing pan B cell antigens including CD20 and CD23 associated with coexpression of CD5. This population shows extremely dim/negative staining for surface immunoglobulin light chains. (BNS:gt, 11/21/11) Specimen Table Bone Marrow count performed on 500 cells shows: Blasts: 0% Myeloid 3% Promyelocyts: 0% Myelocytes: 0% Erythroid 2% Metamyelocyts: 1% Bands: 1% Lymphocytes: 95% Neutrophils: 1% Eosinophils: 0% Plasma Cells: 0% Basophils: 0% Monocytes: 0% M:E ratio: 1.5 Gross Received in [facility] are tissue fragments which aggregate 0.4 x 0.4 x 0.1 cm. The specimen is submitted in toto. Received in a separate container in [facility] are two cores of bone measuring 2.2 and 2.8 cm in length and each 0.2 cm in diameter. The specimen is submitted in toto following decalcification. (GP:eps 11/18/11) 2 of   RADIOGRAPHIC STUDIES: CT CHEST  Findings: Bilateral axillary lymphadenopathy is slightly  progressed compared to prior. For example, index node in the  right axilla measures 15 mm short axis (image 14) compared to 9 mm  on prior. Adjacent rounded 20 mm lymph node (#8) compares to 13 mm  on prior. There are clustered small supraclavicular lymph nodes  which also appears slightly enlarged. Within the mediastinum small  paratracheal lymph nodes are similar. No pericardial fluid.  Esophagus is normal. Coronary artery stent is noted.  Review of the lung parenchyma demonstrates no new suspicious  pulmonary nodules.  IMPRESSION:  1. Mild continued progressive enlargement of axillary  lymphadenopathy.  2. Stable supraclavicular and mediastinal adenopathy.  CT ABDOMEN AND PELVIS  Findings: Retroperitoneal lymphadenopathy is slightly progressed  compared to prior. For example conglomerate of nodes left of the  aorta measures 26 mm short axis compared to 22  mm on prior (image  71). Periportal lymph node measures 23 mm compared to 20 mm on  prior.  The spleen is massively enlarged with a calculated volume of 2362  ml compared to 1972 ml on prior.  Bilateral external iliac lymph nodes are increased in volume. Left  node measures 19mm short axis compared to 13 on prior. Inguinal  adenopathy is also increased.  The liver, liver is normal. Gallbladder is absent. The spleen,  adrenal glands, kidneys are normal.  The colon rectosigmoid colon are normal.  The bladder prostate gland normal. No pelvic fluid.  IMPRESSION:  1. Progressive increase in retroperitoneal, iliac, and inguinal  lymphadenopathy.  2. Interval increase in massive splenomegaly.     ASSESSMENT: 62 year old gentleman with  #1 CLL. A CBC done today now shows the count to be lower than it had been previously platelets are lower,  he does not have any evidence of autoimmune hemolytic anemia. I do think that he remains quite stable. The fact that he has not had any infections there is no fatigue there is  no hemolysis I think we could continue to observe him for now. I have discussed this with the patient as well as his wife.  #2 CT scans do reveal some progressive lymphadenopathy. Also he does have massive splenomegaly which is concerning. We discussed symptoms of splenic rupture. He knows to call me with any problems.  #3 I have discussed the possibility of starting them on treatment sometime in April or May. I do think it would be feasible to do elective treatment for his CLL. He is having significant thrombocytopenia likely to splenomegaly. They will see me in April and we will discuss the logistics of chemotherapy at that time.Marland Kitchen  PLAN:  #1 I will plan on seeing the patient back in one month's time. We will defer treatment at this time.  #2 they know to call me with any problems  All questions were answered. The patient knows to call the clinic with any problems, questions or  concerns. We can certainly see the patient much sooner if necessary.  I spent 25 minutes counseling the patient face to face. The total time spent in the appointment was 30 minutes.  Drue Second, MD Medical/Oncology [facility] [phone] (beeper) [phone] (Office)

## 2012-04-16 NOTE — Patient Instructions (Addendum)
I will see you back in 1 month 05/18/12

## 2012-05-18 ENCOUNTER — Encounter: Payer: Self-pay | Admitting: Oncology

## 2012-05-18 ENCOUNTER — Telehealth: Payer: Self-pay | Admitting: *Deleted

## 2012-05-18 ENCOUNTER — Other Ambulatory Visit: Payer: Self-pay | Admitting: Oncology

## 2012-05-18 ENCOUNTER — Ambulatory Visit (HOSPITAL_BASED_OUTPATIENT_CLINIC_OR_DEPARTMENT_OTHER): Payer: BC Managed Care – PPO | Admitting: Oncology

## 2012-05-18 ENCOUNTER — Other Ambulatory Visit (HOSPITAL_BASED_OUTPATIENT_CLINIC_OR_DEPARTMENT_OTHER): Payer: BC Managed Care – PPO

## 2012-05-18 ENCOUNTER — Ambulatory Visit (HOSPITAL_BASED_OUTPATIENT_CLINIC_OR_DEPARTMENT_OTHER): Payer: BC Managed Care – PPO | Admitting: Lab

## 2012-05-18 ENCOUNTER — Telehealth: Payer: Self-pay | Admitting: Nurse Practitioner

## 2012-05-18 VITALS — BP 118/72 | HR 68 | Temp 98.3°F | Resp 20 | Ht 72.0 in | Wt 220.0 lb

## 2012-05-18 DIAGNOSIS — D696 Thrombocytopenia, unspecified: Secondary | ICD-10-CM

## 2012-05-18 DIAGNOSIS — C9110 Chronic lymphocytic leukemia of B-cell type not having achieved remission: Secondary | ICD-10-CM

## 2012-05-18 DIAGNOSIS — R599 Enlarged lymph nodes, unspecified: Secondary | ICD-10-CM

## 2012-05-18 DIAGNOSIS — R161 Splenomegaly, not elsewhere classified: Secondary | ICD-10-CM

## 2012-05-18 LAB — CBC WITH DIFFERENTIAL/PLATELET
HGB: 10.9 g/dL — ABNORMAL LOW (ref 13.0–17.1)
MCH: 33.8 pg — ABNORMAL HIGH (ref 27.2–33.4)
MCV: 99.5 fL — ABNORMAL HIGH (ref 79.3–98.0)
Platelets: 59 10*3/uL — ABNORMAL LOW (ref 140–400)
RBC: 3.23 10*6/uL — ABNORMAL LOW (ref 4.20–5.82)
WBC: 239 10*3/uL (ref 4.0–10.3)

## 2012-05-18 LAB — COMPREHENSIVE METABOLIC PANEL (CC13)
ALT: 47 U/L (ref 0–55)
CO2: 23 mEq/L (ref 22–29)
Calcium: 8.9 mg/dL (ref 8.4–10.4)
Chloride: 107 mEq/L (ref 98–107)
Glucose: 257 mg/dl — ABNORMAL HIGH (ref 70–99)
Sodium: 137 mEq/L (ref 136–145)
Total Protein: 6.1 g/dL — ABNORMAL LOW (ref 6.4–8.3)

## 2012-05-18 LAB — MANUAL DIFFERENTIAL
Basophil: 0 % (ref 0–2)
EOS: 0 % (ref 0–7)
MONO: 0 % (ref 0–14)
Metamyelocytes: 0 % (ref 0–0)
Myelocytes: 0 % (ref 0–0)

## 2012-05-18 LAB — LACTATE DEHYDROGENASE (CC13): LDH: 244 U/L (ref 125–245)

## 2012-05-18 LAB — URIC ACID (CC13): Uric Acid, Serum: 8.9 mg/dl — ABNORMAL HIGH (ref 2.6–7.4)

## 2012-05-18 MED ORDER — DEXAMETHASONE 4 MG PO TABS: 8.0000 mg | ORAL_TABLET | Freq: Two times a day (BID) | ORAL | Status: DC

## 2012-05-18 MED ORDER — LORAZEPAM 0.5 MG PO TABS: 0.5000 mg | ORAL_TABLET | Freq: Four times a day (QID) | ORAL | Status: DC | PRN

## 2012-05-18 MED ORDER — ONDANSETRON HCL 8 MG PO TABS: 8.0000 mg | ORAL_TABLET | Freq: Two times a day (BID) | ORAL | Status: DC

## 2012-05-18 MED ORDER — ALLOPURINOL 300 MG PO TABS: 300.0000 mg | ORAL_TABLET | Freq: Every day | ORAL | Status: DC

## 2012-05-18 MED ORDER — PROCHLORPERAZINE 25 MG RE SUPP: 25.0000 mg | Freq: Two times a day (BID) | RECTAL | Status: DC | PRN

## 2012-05-18 MED ORDER — SULFAMETHOXAZOLE-TRIMETHOPRIM 800-160 MG PO TABS: 1.0000 | ORAL_TABLET | ORAL | Status: DC

## 2012-05-18 MED ORDER — PROCHLORPERAZINE MALEATE 10 MG PO TABS: 10.0000 mg | ORAL_TABLET | Freq: Four times a day (QID) | ORAL | Status: DC | PRN

## 2012-05-18 MED ORDER — ACYCLOVIR 400 MG PO TABS: 400.0000 mg | ORAL_TABLET | Freq: Every day | ORAL | Status: DC

## 2012-05-18 NOTE — Patient Instructions (Addendum)
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 FLUDARABINE (floo DARE a been) is a chemotherapy drug. It interferes with the growth of cancer cells. It is usually used to treat chronic lymphocytic leukemia (CLL). This medicine may be used for other purposes; ask your health care provider or pharmacist if you have questions. What should I tell my health care provider before I take this medicine? They need to know if you have any of these conditions: -infection (especially virus infection such as chickenpox or cold sores) -kidney disease -low blood counts like low platelets, red blood cells, white blood cells -an unusual or allergic reaction to fludarabine, other chemotherapy, other medicines, foods, dyes, or preservatives -pregnant or trying to get pregnant -breast-feeding How should I use this medicine? This drug is given as an infusion into a vein. It is administered in a hospital or clinic by a specially trained health care professional. Talk to your pediatrician regarding the use of this medicine in children. Special care may be needed. Overdosage: If you think you have taken too much of this medicine contact a poison control center or emergency room at once. NOTE: This medicine is only for you. Do not share this medicine with others. What if I miss a dose? It is important not to miss your dose. Call your doctor or health care professional if you are unable to keep an appointment. What may interact with this medicine? Do not take this medicine with any of the following medications: -pentostatin This medicine may also interact with the following medications: -medicines to increase blood counts like filgrastim, pegfilgrastim,  sargramostim -vaccines Talk to your doctor or health care professional before taking any of these medicines: -acetaminophen -aspirin -ibuprofen -naproxen -ketoprofen This list may not describe all possible interactions. Give your health care provider a list of all the medicines, herbs, non-prescription drugs, or dietary supplements you use. Also tell them if you smoke, drink alcohol, or use illegal drugs. Some items may interact with your medicine. What should I watch for while using this medicine? This drug may make you feel generally unwell. This is not uncommon, as chemotherapy can affect healthy cells as well as cancer cells. Report any side effects. Continue your course of treatment even though you feel ill unless your doctor tells you to stop. Call your doctor or health care professional for advice if you get a fever, chills or sore throat, or other symptoms of a cold or flu. Do not treat yourself. This drug decreases your body's ability to fight infections. Try to avoid being around people who are sick. This medicine may increase your risk to bruise or bleed. Call your doctor or health care professional if you notice any unusual bleeding. Be careful brushing and flossing your teeth or using a toothpick because you may get an infection or bleed more easily. If you have any dental work done, tell your dentist you are receiving this medicine. Avoid taking products that contain aspirin, acetaminophen, ibuprofen, naproxen, or ketoprofen unless instructed by your doctor. These medicines may hide a fever. Do not become pregnant while taking this medicine. Women should inform their doctor if they wish to become pregnant or think they might be pregnant. There is a potential for serious side effects to an unborn  child. Talk to your health care professional or pharmacist for more information. Do not breast-feed an infant while taking this medicine. Men should inform their doctors if they wish to father a  child. This medicine may lower sperm counts. What side effects may I notice from receiving this medicine? Side effects that you should report to your doctor or health care professional as soon as possible: -allergic reactions like skin rash, itching or hives, swelling of the face, lips, or tongue -low blood counts - this medicine may decrease the number of white blood cells, red blood cells and platelets. You may be at increased risk for infections and bleeding. -signs of infection - fever or chills, cough, sore throat, pain or difficulty passing urine -signs of decreased platelets or bleeding - bruising, pinpoint red spots on the skin, black, tarry stools, nosebleeds -signs of decreased red blood cells - unusually weak or tired, fainting spells, lightheadedness -breathing problems -changes in hearing -changes in vision -confusion -dry cough -mouth sores -muscle weakness -pain, tingling, numbness in the hands or feet -swelling of the ankles, feet, hands -trouble passing urine or change in the amount of urine -yellowing of the eyes or skin Side effects that usually do not require medical attention (report to your doctor or health care professional if they continue or are bothersome): -constipation -diarrhea -hair loss -loss of appetite -nausea, vomiting -trouble sleeping This list may not describe all possible side effects. Call your doctor for medical advice about side effects. You may report side effects to FDA at 1-800-FDA-1088. Where should I keep my medicine? This drug is given in a hospital or clinic and will not be stored at home. NOTE: This sheet is a summary. It may not cover all possible information. If you have questions about this medicine, talk to your doctor, pharmacist, or health care provider.  2012, [employer]. (06/06/2007 1:47:35 PM)  Cyclophosphamide injection What is this medicine? CYCLOPHOSPHAMIDE (sye kloe FOSS fa mide) is a chemotherapy drug. It  slows the growth of cancer cells. This medicine is used to treat many types of cancer like lymphoma, myeloma, leukemia, breast cancer, and ovarian cancer, to name a few. It is also used to treat nephrotic syndrome in children. This medicine may be used for other purposes; ask your health care provider or pharmacist if you have questions. What should I tell my health care provider before I take this medicine? They need to know if you have any of these conditions: -blood disorders -history of other chemotherapy -history of radiation therapy -infection -kidney disease -liver disease -tumors in the bone marrow -an unusual or allergic reaction to cyclophosphamide, other chemotherapy, other medicines, foods, dyes, or preservatives -pregnant or trying to get pregnant -breast-feeding How should I use this medicine? This drug is usually given as an injection into a vein or muscle or by infusion into a vein. It is administered in a hospital or clinic by a specially trained health care professional. Talk to your pediatrician regarding the use of this medicine in children. While this drug may be prescribed for selected conditions, precautions do apply. Overdosage: If you think you have taken too much of this medicine contact a poison control center or emergency room at once. NOTE: This medicine is only for you. Do not share this medicine with others. What if I miss a dose? It is important not to miss your dose. Call your doctor or health care professional if you are unable to keep an appointment. What may interact with this medicine?  Do not take this medicine with any of the following medications: -mibefradil -nalidixic acid This medicine may also interact with the following medications: -doxorubicin -etanercept -medicines to increase blood counts like filgrastim, pegfilgrastim, sargramostim -medicines that block muscle or nerve pain -St. John's Wort -phenobarbital -succinylcholine  chloride -trastuzumab -vaccines Talk to your doctor or health care professional before taking any of these medicines: -acetaminophen -aspirin -ibuprofen -ketoprofen -naproxen This list may not describe all possible interactions. Give your health care provider a list of all the medicines, herbs, non-prescription drugs, or dietary supplements you use. Also tell them if you smoke, drink alcohol, or use illegal drugs. Some items may interact with your medicine. What should I watch for while using this medicine? Visit your doctor for checks on your progress. This drug may make you feel generally unwell. This is not uncommon, as chemotherapy can affect healthy cells as well as cancer cells. Report any side effects. Continue your course of treatment even though you feel ill unless your doctor tells you to stop. Drink water or other fluids as directed. Urinate often, even at night. In some cases, you may be given additional medicines to help with side effects. Follow all directions for their use. Call your doctor or health care professional for advice if you get a fever, chills or sore throat, or other symptoms of a cold or flu. Do not treat yourself. This drug decreases your body's ability to fight infections. Try to avoid being around people who are sick. This medicine may increase your risk to bruise or bleed. Call your doctor or health care professional if you notice any unusual bleeding. Be careful brushing and flossing your teeth or using a toothpick because you may get an infection or bleed more easily. If you have any dental work done, tell your dentist you are receiving this medicine. Avoid taking products that contain aspirin, acetaminophen, ibuprofen, naproxen, or ketoprofen unless instructed by your doctor. These medicines may hide a fever. Do not become pregnant while taking this medicine. Women should inform their doctor if they wish to become pregnant or think they might be pregnant. There  is a potential for serious side effects to an unborn child. Talk to your health care professional or pharmacist for more information. Do not breast-feed an infant while taking this medicine. Men should inform their doctor if they wish to father a child. This medicine may lower sperm counts. If you are going to have surgery, tell your doctor or health care professional that you have taken this medicine. What side effects may I notice from receiving this medicine? Side effects that you should report to your doctor or health care professional as soon as possible: -allergic reactions like skin rash, itching or hives, swelling of the face, lips, or tongue -low blood counts - this medicine may decrease the number of white blood cells, red blood cells and platelets. You may be at increased risk for infections and bleeding. -signs of infection - fever or chills, cough, sore throat, pain or difficulty passing urine -signs of decreased platelets or bleeding - bruising, pinpoint red spots on the skin, black, tarry stools, blood in the urine -signs of decreased red blood cells - unusually weak or tired, fainting spells, lightheadedness -breathing problems -dark urine -mouth sores -pain, swelling, redness at site where injected -swelling of the ankles, feet, hands -trouble passing urine or change in the amount of urine -weight gain -yellowing of the eyes or skin Side effects that usually do not require medical attention (  report to your doctor or health care professional if they continue or are bothersome): -changes in nail or skin color -diarrhea -hair loss -loss of appetite -missed menstrual periods -nausea, vomiting -stomach pain This list may not describe all possible side effects. Call your doctor for medical advice about side effects. You may report side effects to FDA at 1-800-FDA-1088. Where should I keep my medicine? This drug is given in a hospital or clinic and will not be stored at  home. NOTE: This sheet is a summary. It may not cover all possible information. If you have questions about this medicine, talk to your doctor, pharmacist, or health care provider.  2013, [employer]/Gold Standard. (05/08/2007 2:32:25 PM)   Rituximab injection What is this medicine? RITUXIMAB (ri TUX i mab) is a monoclonal antibody. This medicine changes the way the body's immune system works. It is used commonly to treat non-Hodgkin's lymphoma and other conditions. In cancer cells, this drug targets a specific protein within cancer cells and stops the cancer cells from growing. It is also used to treat rhuematoid arthritis (RA). In RA, this medicine slow the inflammatory process and help reduce joint pain and swelling. This medicine is often used with other cancer or arthritis medications. This medicine may be used for other purposes; ask your health care provider or pharmacist if you have questions. What should I tell my health care provider before I take this medicine? They need to know if you have any of these conditions: -blood disorders -heart disease -history of hepatitis B -infection (especially a virus infection such as chickenpox, cold sores, or herpes) -irregular heartbeat -kidney disease -lung or breathing disease, like asthma -lupus -an unusual or allergic reaction to rituximab, mouse proteins, other medicines, foods, dyes, or preservatives -pregnant or trying to get pregnant -breast-feeding How should I use this medicine? This medicine is for infusion into a vein. It is administered in a hospital or clinic by a specially trained health care professional. A special MedGuide will be given to you by the pharmacist with each prescription and refill. Be sure to read this information carefully each time. Talk to your pediatrician regarding the use of this medicine in children. This medicine is not approved for use in children. Overdosage: If you think you have taken too much of this  medicine contact a poison control center or emergency room at once. NOTE: This medicine is only for you. Do not share this medicine with others. What if I miss a dose? It is important not to miss a dose. Call your doctor or health care professional if you are unable to keep an appointment. What may interact with this medicine? -cisplatin -medicines for blood pressure -some other medicines for arthritis -vaccines This list may not describe all possible interactions. Give your health care provider a list of all the medicines, herbs, non-prescription drugs, or dietary supplements you use. Also tell them if you smoke, drink alcohol, or use illegal drugs. Some items may interact with your medicine. What should I watch for while using this medicine? Report any side effects that you notice during your treatment right away, such as changes in your breathing, fever, chills, dizziness or lightheadedness. These effects are more common with the first dose. Visit your prescriber or health care professional for checks on your progress. You will need to have regular blood work. Report any other side effects. The side effects of this medicine can continue after you finish your treatment. Continue your course of treatment even though you feel ill  unless your doctor tells you to stop. Call your doctor or health care professional for advice if you get a fever, chills or sore throat, or other symptoms of a cold or flu. Do not treat yourself. This drug decreases your body's ability to fight infections. Try to avoid being around people who are sick. This medicine may increase your risk to bruise or bleed. Call your doctor or health care professional if you notice any unusual bleeding. Be careful brushing and flossing your teeth or using a toothpick because you may get an infection or bleed more easily. If you have any dental work done, tell your dentist you are receiving this medicine. Avoid taking products that contain  aspirin, acetaminophen, ibuprofen, naproxen, or ketoprofen unless instructed by your doctor. These medicines may hide a fever. Do not become pregnant while taking this medicine. Women should inform their doctor if they wish to become pregnant or think they might be pregnant. There is a potential for serious side effects to an unborn child. Talk to your health care professional or pharmacist for more information. Do not breast-feed an infant while taking this medicine. What side effects may I notice from receiving this medicine? Side effects that you should report to your doctor or health care professional as soon as possible: -allergic reactions like skin rash, itching or hives, swelling of the face, lips, or tongue -low blood counts - this medicine may decrease the number of white blood cells, red blood cells and platelets. You may be at increased risk for infections and bleeding. -signs of infection - fever or chills, cough, sore throat, pain or difficulty passing urine -signs of decreased platelets or bleeding - bruising, pinpoint red spots on the skin, black, tarry stools, blood in the urine -signs of decreased red blood cells - unusually weak or tired, fainting spells, lightheadedness -breathing problems -confused, not responsive -chest pain -fast, irregular heartbeat -feeling faint or lightheaded, falls -mouth sores -redness, blistering, peeling or loosening of the skin, including inside the mouth -stomach pain -swelling of the ankles, feet, or hands -trouble passing urine or change in the amount of urine Side effects that usually do not require medical attention (report to your doctor or other health care professional if they continue or are bothersome): -anxiety -headache -loss of appetite -muscle aches -nausea -night sweats This list may not describe all possible side effects. Call your doctor for medical advice about side effects. You may report side effects to FDA at  1-800-FDA-1088. Where should I keep my medicine? This drug is given in a hospital or clinic and will not be stored at home. NOTE: This sheet is a summary. It may not cover all possible information. If you have questions about this medicine, talk to your doctor, pharmacist, or health care provider.  2013, [employer] Standard. (10/01/2007 2:04:59 PM)

## 2012-05-18 NOTE — Telephone Encounter (Signed)
Called pt to let him know that his tx and lab appts are now on my chart to view.

## 2012-05-18 NOTE — Telephone Encounter (Signed)
Lab addon was scheduled along with MD visit. Called IR however, Ryan Copeland has to speak with his doctors office about a med that needs to be held for 7 days. Pt is also waiting for his tx to be added. Once the tx are added i will add the labs. Pt stated to give him a call and he will then look on my chart instead of me mailing his appts.

## 2012-05-18 NOTE — Telephone Encounter (Signed)
Phone call today from [facility] - patient is needing Port a Cath placed. He is on Effient. Last cath note is reviewed by myself and by Dr. [place]. 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 Ok to stop the Effient for 7 days. Would continue the aspirin.   Rosalio Macadamia, RN, ANP-C [facility] [address] [place], [place]  [address]

## 2012-05-19 LAB — IGG, IGA, IGM: IgM, Serum: 15 mg/dL — ABNORMAL LOW (ref 41–251)

## 2012-05-21 ENCOUNTER — Other Ambulatory Visit: Payer: Self-pay | Admitting: Certified Registered Nurse Anesthetist

## 2012-05-21 ENCOUNTER — Telehealth: Payer: Self-pay | Admitting: *Deleted

## 2012-05-21 NOTE — Telephone Encounter (Signed)
sw pt he is aware of his chemo edu for 05/29/13 @ 5pm.

## 2012-05-22 ENCOUNTER — Encounter: Payer: Self-pay | Admitting: Oncology

## 2012-05-23 ENCOUNTER — Other Ambulatory Visit: Payer: Self-pay | Admitting: Radiology

## 2012-05-24 ENCOUNTER — Encounter (HOSPITAL_COMMUNITY): Payer: Self-pay | Admitting: Pharmacy Technician

## 2012-05-29 ENCOUNTER — Other Ambulatory Visit: Payer: BC Managed Care – PPO

## 2012-05-29 ENCOUNTER — Telehealth: Payer: Self-pay | Admitting: Oncology

## 2012-05-29 ENCOUNTER — Telehealth: Payer: Self-pay | Admitting: *Deleted

## 2012-05-29 NOTE — Telephone Encounter (Signed)
No additional note

## 2012-06-01 ENCOUNTER — Encounter (HOSPITAL_COMMUNITY): Payer: Self-pay

## 2012-06-01 ENCOUNTER — Other Ambulatory Visit: Payer: Self-pay | Admitting: Oncology

## 2012-06-01 ENCOUNTER — Ambulatory Visit (HOSPITAL_COMMUNITY)
Admission: RE | Admit: 2012-06-01 | Discharge: 2012-06-01 | Disposition: A | Discharge status: Home / Self Care | Payer: BC Managed Care – PPO | Source: Ambulatory Visit | Attending: Oncology | Admitting: Oncology

## 2012-06-01 DIAGNOSIS — Z79899 Other long term (current) drug therapy: Secondary | ICD-10-CM | POA: Insufficient documentation

## 2012-06-01 DIAGNOSIS — C9110 Chronic lymphocytic leukemia of B-cell type not having achieved remission: Secondary | ICD-10-CM | POA: Insufficient documentation

## 2012-06-01 DIAGNOSIS — F17200 Nicotine dependence, unspecified, uncomplicated: Secondary | ICD-10-CM | POA: Insufficient documentation

## 2012-06-01 DIAGNOSIS — E785 Hyperlipidemia, unspecified: Secondary | ICD-10-CM | POA: Insufficient documentation

## 2012-06-01 LAB — BASIC METABOLIC PANEL
CO2: 24 mEq/L (ref 19–32)
Calcium: 9.5 mg/dL (ref 8.4–10.5)
Chloride: 103 mEq/L (ref 96–112)
Glucose, Bld: 168 mg/dL — ABNORMAL HIGH (ref 70–99)
Potassium: 4.6 mEq/L (ref 3.5–5.1)
Sodium: 134 mEq/L — ABNORMAL LOW (ref 135–145)

## 2012-06-01 LAB — CBC
Hemoglobin: 10.4 g/dL — ABNORMAL LOW (ref 13.0–17.0)
MCV: 102.8 fL — ABNORMAL HIGH (ref 78.0–100.0)
Platelets: 51 10*3/uL — ABNORMAL LOW (ref 150–400)
RBC: 3.23 MIL/uL — ABNORMAL LOW (ref 4.22–5.81)
WBC: 220.5 10*3/uL (ref 4.0–10.5)

## 2012-06-01 LAB — APTT: aPTT: 30 seconds (ref 24–37)

## 2012-06-01 LAB — GLUCOSE, CAPILLARY: Glucose-Capillary: 156 mg/dL — ABNORMAL HIGH (ref 70–99)

## 2012-06-01 LAB — PROTIME-INR: Prothrombin Time: 14.6 seconds (ref 11.6–15.2)

## 2012-06-01 MED ORDER — HEPARIN SOD (PORK) LOCK FLUSH 100 UNIT/ML IV SOLN
500.0000 [IU] | Freq: Once | INTRAVENOUS | Status: AC
  Administered 2012-06-01: 500 [IU] via INTRAVENOUS

## 2012-06-01 MED ORDER — LIDOCAINE HCL 1 % IJ SOLN
INTRAMUSCULAR | Status: AC
  Filled 2012-06-01: qty 20

## 2012-06-01 MED ORDER — FENTANYL CITRATE 0.05 MG/ML IJ SOLN
INTRAMUSCULAR | Status: AC
  Filled 2012-06-01: qty 4

## 2012-06-01 MED ORDER — MIDAZOLAM HCL 2 MG/2ML IJ SOLN
INTRAMUSCULAR | Status: AC | PRN
  Administered 2012-06-01: 1 mg via INTRAVENOUS
  Administered 2012-06-01: 2 mg via INTRAVENOUS

## 2012-06-01 MED ORDER — CEFAZOLIN SODIUM-DEXTROSE 2-3 GM-% IV SOLR
INTRAVENOUS | Status: AC
  Filled 2012-06-01: qty 50

## 2012-06-01 MED ORDER — CEFAZOLIN SODIUM-DEXTROSE 2-3 GM-% IV SOLR
2.0000 g | Freq: Once | INTRAVENOUS | Status: AC
  Administered 2012-06-01: 2 g via INTRAVENOUS

## 2012-06-01 MED ORDER — SODIUM CHLORIDE 0.9 % IV SOLN
Freq: Once | INTRAVENOUS | Status: AC
  Administered 2012-06-01: 12:00:00 via INTRAVENOUS

## 2012-06-01 MED ORDER — FENTANYL CITRATE 0.05 MG/ML IJ SOLN
INTRAMUSCULAR | Status: AC | PRN
  Administered 2012-06-01: 100 ug via INTRAVENOUS

## 2012-06-01 MED ORDER — MIDAZOLAM HCL 2 MG/2ML IJ SOLN
INTRAMUSCULAR | Status: AC
  Filled 2012-06-01: qty 4

## 2012-06-01 NOTE — H&P (Signed)
Chief Complaint: "I'm here for a port" Referring Physician:Khan HPI: Ryan Copeland is an 62 y.o. male with CLL who is referred for portacath placement to start IV chemotherapy. PMHx and meds reviewed. Pt feels well today, no recent illness or fevers.  Past Medical History:  Past Medical History  Diagnosis Date  . 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 Hyperlipidemia   . Tobacco abuse   . PVD (peripheral vascular disease)   . 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 Femoral stents    . Coronary stent placement  May 2011  . Cholecystectomy  2007    Family History:  Family History  Problem Relation Age of Onset  . Lung cancer Mother 1  . Cancer Mother     lung  . Heart failure Father 25  . Heart disease Father     Social History:  reports that he has been smoking Cigarettes.  He has a 43 pack-year smoking history. He has never used smokeless tobacco. He reports that he drinks about 7.2 ounces of alcohol per week. He reports that he does not use illicit drugs.  Allergies:  Allergies  Allergen Reactions  . Codeine Hives    Pt states he can take a few, more reaction with extended doses.    Medications: ALPRAZolam (XANAX) 0.25 MG tablet (Taking) 90 tablet 1 12/26/2011 Sig: Take 1 po q 6 hours prn for anxiety Class: Print Number of times this order has been changed since signing: 1 Order [address] aspirin 325 MG tablet (Taking) Sig - Route: Take 325 mg by mouth daily. - Oral Class: Historical Med Number of times this order has been changed since signing: 4 Order [address] esomeprazole (NEXIUM) 40 MG capsule (Taking) 180 capsule 3 08/11/2011 Sig - Route: Take 1 capsule (40 mg total) by mouth 2 (two) times daily. - Oral Number of times this order has been changed since signing: 1 Order [address] metFORMIN (GLUCOPHAGE-XR) 500 MG 24 hr tablet (Taking) Sig -  Route: Take 500 mg by mouth 2 (two) times daily. - Oral Class: Historical Med metoprolol succinate (TOPROL-XL) 50 MG 24 hr tablet (Taking) Sig - Route: Take 50 mg by mouth daily with breakfast. Take with or immediately following a meal. - Oral Class: Historical Med Number of times this order has been changed since signing: 1 Order [address] nitroGLYCERIN (NITROSTAT) 0.4 MG SL tablet (Taking) 25 tablet 12 07/29/2011 Sig - Route: Place 1 tablet (0.4 mg total) under the tongue every 5 (five) minutes as needed. - Sublingual Number of times this order has been changed since signing: 1 Order [address] pregabalin (LYRICA) 75 MG capsule (Taking) Sig - Route: Take 75-150 mg by mouth 4 (four) times daily. 1 cap three times daily and 2 caps at bedtime - Oral Class: Historical Med tetrahydrozoline 0.05 % ophthalmic solution (Taking) Sig - Route: Place 1 drop into both eyes as needed. Red eyes - Both Eyes Class: Historical Med topiramate (TOPAMAX) 25 MG capsule (Taking) Sig - Route: Take 25 mg by mouth 2 (two) times daily. - Oral Class: Historical Med varenicline (CHANTIX) 0.5 MG tablet (Taking) 360 tablet 0 01/25/2012 Sig - Route: Take 1 tablet (0.5 mg total) by mouth 2 (two) times daily. - Oral Comment: Take 0.5 mg daily days 1-3, 0.5mg  BID days 4-7, then 1mg  BID thereafter. Number of times this  order has been changed since signing: 1 Order [address] acyclovir (ZOVIRAX) 400 MG tablet 30 tablet 5 05/18/2012 Sig - Route: Take 1 tablet (400 mg total) by mouth daily. - Oral Non-formulary Exception Code: Dosage strength/form Number of times this order has been changed since signing: 1 Order [address] allopurinol (ZYLOPRIM) 300 MG tablet 30 tablet 6 05/18/2012 Sig - Route: Take 1 tablet (300 mg total) by mouth daily. - Oral Number of times this order has been changed since signing: 1 Order [address] dexamethasone (DECADRON) 4 MG tablet 30 tablet 1 05/18/2012 Sig - Route: Take 2 tablets (8 mg total) by mouth 2 (two) times daily  with a meal. Take daily starting the day after chemotherapy for 2 days. Take with food. - Oral Class: Print Non-formulary Exception Code: Dosage strength/form Number of times this order has been changed since signing: 1 Order [address] LORazepam (ATIVAN) 0.5 MG tablet 30 tablet 0 05/18/2012 Sig - Route: Take 1 tablet (0.5 mg total) by mouth every 6 (six) hours as needed (Nausea or vomiting). - Oral Class: Print Non-formulary Exception Code: Dosage strength/form Number of times this order has been changed since signing: 1 Order [address] ondansetron (ZOFRAN) 8 MG tablet 30 tablet 1 05/18/2012 Sig - Route: Take 1 tablet (8 mg total) by mouth 2 (two) times daily. Take two times a day starting the day after chemo for 2 days. Then take two times a day as needed for nausea or vomiting. - Oral Class: Print Non-formulary Exception Code: Dosage strength/form Number of times this order has been changed since signing: 1 Order [address] prasugrel (EFFIENT) 10 MG TABS Sig - Route: Take 10 mg by mouth daily with breakfast. - Oral Class: Historical Med Number of times this order has been changed since signing: 1 Order [address] prochlorperazine (COMPAZINE) 10 MG tablet 30 tablet 1 05/18/2012 Sig - Route: Take 1 tablet (10 mg total) by mouth every 6 (six) hours as needed (Nausea or vomiting). - Oral Non-formulary Exception Code: Dosage strength/form Number of times this order has been changed since signing: 1 Order [address] prochlorperazine (COMPAZINE) 25 MG suppository 12 suppository 3 05/18/2012 Sig - Route: Place 1 suppository (25 mg total) rectally every 12 (twelve) hours as needed for nausea. - Rectal Non-formulary Exception Code: Dosage strength/form Number of times this order has been changed since signing: 1 Order [address] sulfamethoxazole-trimethoprim (BACTRIM DS,SEPTRA DS) 800-160 MG per tablet 12 tablet 5 05/18/2012 Sig - Route: Take 1 tablet by mouth 3 (three) times a week. - Oral    Please HPI for pertinent  positives, otherwise complete 10 system ROS negative.  Physical Exam: Blood pressure 122/65, pulse 65, temperature 97.6 F (36.4 C), temperature source Oral, resp. rate 16, SpO2 98.00%. There is no weight on file to calculate BMI.   General Appearance:  Alert, cooperative, no distress, appears stated age  Head:  Normocephalic, without obvious abnormality, atraumatic  ENT: Unremarkable  Neck: Supple, symmetrical, trachea midline  Lungs:   Clear to auscultation bilaterally, no w/r/r, respirations unlabored without use of accessory muscles.  Chest Wall:  No tenderness or deformity  Heart:  Regular rate and rhythm, S1, S2 normal, no murmur, rub or gallop.   Neurologic: Normal affect, no gross deficits.   Results for orders placed during the hospital encounter of 06/01/12 (from the past 48 hour(s))  APTT     Status: None   Collection Time    06/01/12 11:45 AM      Result  Value Range   aPTT 30  24 - 37 seconds  BASIC METABOLIC PANEL     Status: Abnormal   Collection Time    06/01/12 11:45 AM      Result Value Range   Sodium 134 (*) 135 - 145 mEq/L   Potassium 4.6  3.5 - 5.1 mEq/L   Chloride 103  96 - 112 mEq/L   CO2 24  19 - 32 mEq/L   Glucose, Bld 168 (*) 70 - 99 mg/dL   BUN 15  6 - 23 mg/dL   Creatinine, Ser 5.28  0.50 - 1.35 mg/dL   Calcium 9.5  8.4 - 41.3 mg/dL   GFR calc non Af Amer 88 (*) >90 mL/min   GFR calc Af Amer >90  >90 mL/min   Comment:            The eGFR has been calculated     using the CKD EPI equation.     This calculation has not been     validated in all clinical     situations.     eGFR's persistently     <90 mL/min signify     possible Chronic Kidney Disease.  CBC     Status: Abnormal (Preliminary result)   Collection Time    06/01/12 11:45 AM      Result Value Range   WBC PENDING  4.0 - 10.5 K/uL   RBC 3.23 (*) 4.22 - 5.81 MIL/uL   Hemoglobin 10.4 (*) 13.0 - 17.0 g/dL   HCT 24.4 (*) 01.0 - 27.2 %   MCV 102.8 (*) 78.0 - 100.0 fL   MCH 32.2  26.0  - 34.0 pg   MCHC 31.3  30.0 - 36.0 g/dL   RDW 53.6 (*) 64.4 - 03.4 %   Platelets PENDING  150 - 400 K/uL  PROTIME-INR     Status: None   Collection Time    06/01/12 11:45 AM      Result Value Range   Prothrombin Time 14.6  11.6 - 15.2 seconds   INR 1.16  0.00 - 1.49   No results found.  Assessment/Plan CLL For portacath placement. Discussed and demonstrated device. Explained procedure, risks, complications, use of sedation Labs reviewed, awaiting final PLT count Consent signed in chart  Brayton El PA-C 06/01/2012, 1:06 PM

## 2012-06-01 NOTE — H&P (Signed)
Agree 

## 2012-06-01 NOTE — Progress Notes (Signed)
CRITICAL VALUE ALERT  Critical value received:WBC  220.5  Date of notification: 06/01/2012  Time of notification: 1:21pm  Critical value read back:yes  Nurse who received alert:  Merlene Morse  MD notified by phone  Responding MD:  Irish Lack MD  Time MD responded:  1:21 pm

## 2012-06-01 NOTE — Procedures (Signed)
Procedure:  Porta-cath Access:  Right IJ vein Findings:  Tip at cavoatrial junction.  No PTX.  OK to use.

## 2012-06-04 LAB — PATHOLOGIST SMEAR REVIEW

## 2012-06-11 ENCOUNTER — Telehealth: Payer: Self-pay | Admitting: Oncology

## 2012-06-11 ENCOUNTER — Encounter: Payer: Self-pay | Admitting: Oncology

## 2012-06-12 ENCOUNTER — Encounter: Payer: Self-pay | Admitting: *Deleted

## 2012-06-12 ENCOUNTER — Other Ambulatory Visit: Payer: BC Managed Care – PPO

## 2012-06-13 ENCOUNTER — Other Ambulatory Visit: Payer: Self-pay | Admitting: Medical Oncology

## 2012-06-13 DIAGNOSIS — C9110 Chronic lymphocytic leukemia of B-cell type not having achieved remission: Secondary | ICD-10-CM

## 2012-06-13 MED ORDER — LIDOCAINE-PRILOCAINE 2.5-2.5 % EX CREA: TOPICAL_CREAM | CUTANEOUS | Status: DC | PRN

## 2012-06-14 ENCOUNTER — Encounter: Payer: Self-pay | Admitting: Oncology

## 2012-06-14 ENCOUNTER — Encounter: Payer: Self-pay | Admitting: Adult Health

## 2012-06-14 NOTE — Progress Notes (Signed)
OFFICE PROGRESS NOTE  CC  Ryan Copeland [address] [place] [address]  DIAGNOSIS: 62 year old gentleman with  #1 stage II CLL.now progressive  #2 polycythemia likely secondary now resolved.  PRIOR THERAPY: Observation  CURRENT THERAPY: Observation  INTERVAL HISTORY: Ryan Copeland 62 y.o. male returns for followup visit today.his white count is significantly elevated as noted  below. He himself feels well. Although his wife tells me that he is more tired he is sweating a lot.he has not had any weight loss. He denies any nausea vomiting he has no myalgias and arthralgias no easy bruising. No early satiety no bruising no fevers chills or night sweats.  MEDICAL HISTORY: Past Medical History  Diagnosis Date  . 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 Hyperlipidemia   . Tobacco abuse   . PVD (peripheral vascular disease)   . 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  MEDICATIONS:  Current Outpatient Prescriptions  Medication Sig Dispense Refill  . ALPRAZolam (XANAX) 0.25 MG tablet Take 1 po q 6 hours prn for anxiety  90 tablet  1  . aspirin 325 MG tablet Take 325 mg by mouth daily.        Marland Kitchen esomeprazole (NEXIUM) 40 MG capsule Take 1 capsule (40 mg total) by mouth 2 (two) times daily.  180 capsule  3  . metFORMIN (GLUCOPHAGE-XR) 500 MG 24 hr tablet Take 500 mg by mouth 2 (two) times daily.      . metoprolol succinate (TOPROL-XL) 50 MG 24 hr tablet Take 50 mg by mouth daily with breakfast. Take with or immediately following a meal.      . nitroGLYCERIN (NITROSTAT) 0.4 MG SL tablet Place 1 tablet (0.4 mg total) under the tongue every 5 (five) minutes as needed.  25 tablet  12  . prasugrel (EFFIENT) 10 MG TABS Take 10 mg by mouth daily with breakfast.      . pregabalin (LYRICA) 75 MG capsule Take 75-150 mg by mouth 4 (four) times daily. 1 cap three times daily and 2 caps at bedtime      .  tetrahydrozoline 0.05 % ophthalmic solution Place 1 drop into both eyes as needed. Red eyes      . topiramate (TOPAMAX) 25 MG capsule Take 25 mg by mouth 2 (two) times daily.      . varenicline (CHANTIX) 0.5 MG tablet Take 1 tablet (0.5 mg total) by mouth 2 (two) times daily.  360 tablet  0  . acyclovir (ZOVIRAX) 400 MG tablet Take 1 tablet (400 mg total) by mouth daily.  30 tablet  5  . allopurinol (ZYLOPRIM) 300 MG tablet Take 1 tablet (300 mg total) by mouth daily.  30 tablet  6  . dexamethasone (DECADRON) 4 MG tablet Take 2 tablets (8 mg total) by mouth 2 (two) times daily with a meal. Take daily starting the day after chemotherapy for 2 days. Take with food.  30 tablet  1  . diphenhydrAMINE (BENADRYL) 25 mg capsule Take 50 mg by mouth at bedtime as needed for itching or sleep.      Marland Kitchen lidocaine-prilocaine (EMLA) cream Apply topically as needed. Place cream on port site 1-2 hours prior to treatment.  30 g  2  . LORazepam (ATIVAN) 0.5 MG tablet Take 1 tablet (0.5 mg total) by mouth every 6 (six) hours as needed (Nausea or vomiting).  30 tablet  0  .  ondansetron (ZOFRAN) 8 MG tablet Take 1 tablet (8 mg total) by mouth 2 (two) times daily. Take two times a day starting the day after chemo for 2 days. Then take two times a day as needed for nausea or vomiting.  30 tablet  1  . prochlorperazine (COMPAZINE) 10 MG tablet Take 1 tablet (10 mg total) by mouth every 6 (six) hours as needed (Nausea or vomiting).  30 tablet  1  . prochlorperazine (COMPAZINE) 25 MG suppository Place 1 suppository (25 mg total) rectally every 12 (twelve) hours as needed for nausea.  12 suppository  3  . sulfamethoxazole-trimethoprim (BACTRIM DS,SEPTRA DS) 800-160 MG per tablet Take 1 tablet by mouth 3 (three) times a week.  12 tablet  5   No current facility-administered medications for this visit.    SURGICAL HISTORY:  Past Surgical History  Procedure Laterality Date  . Femoral stents    . Coronary stent placement  May  2011  . Cholecystectomy  2007    REVIEW OF SYSTEMS:   General: fatigue (-), night sweats (-), fever (-), pain (-) Lymph: palpable nodes (-) HEENT: vision changes (-), mucositis (-), gum bleeding (-), epistaxis (-) Cardiovascular: chest pain (-), palpitations (-) Pulmonary: shortness of breath (-), dyspnea on exertion (-), cough (-), hemoptysis (-) GI:  Early satiety (-), melena (-), dysphagia (-), nausea/vomiting (-), diarrhea (-) GU: dysuria (-), hematuria (-), incontinence (-) Musculoskeletal: joint swelling (-), joint pain (-), back pain (-) Neuro: weakness (-), numbness (-), headache (-), confusion (-) Skin: Rash (-), lesions (-), dryness (-) Psych: depression (-), suicidal/homicidal ideation (-), feeling of hopelessness (-)   PHYSICAL EXAMINATION:  BP 118/72  Pulse 68  Temp(Src) 98.3 F (36.8 C) (Oral)  Resp 20  Ht 6' (1.829 m)  Wt 220 lb (99.791 kg)  BMI 29.83 kg/m2 General: Patient is a well appearing male in no acute distress HEENT: PERRLA, sclerae anicteric no conjunctival pallor, MMM Neck: supple, no palpable adenopathy Lungs: clear to auscultation bilaterally, no wheezes, rhonchi, or rales Cardiovascular: regular rate rhythm, S1, S2, no murmurs, rubs or gallops Abdomen: Soft, non-tender, non-distended, normoactive bowel sounds, no HSM Extremities: warm and well perfused, no clubbing, cyanosis, or edema Skin: No rashes or lesions Neuro: Non-focal ECOG PERFORMANCE STATUS: 0 - Asymptomatic  LABORATORY DATA: Lab Results  Component Value Date   WBC 220.5* 06/01/2012   HGB 10.4* 06/01/2012   HCT 33.2* 06/01/2012   MCV 102.8* 06/01/2012   PLT 51* 06/01/2012      Chemistry      Component Value Date/Time   NA 134* 06/01/2012 1145   NA 137 05/18/2012 1246   K 4.6 06/01/2012 1145   K 4.7 05/18/2012 1246   CL 103 06/01/2012 1145   CL 107 05/18/2012 1246   CO2 24 06/01/2012 1145   CO2 23 05/18/2012 1246   BUN 15 06/01/2012 1145   BUN 18.0 05/18/2012 1246   CREATININE 0.96  06/01/2012 1145   CREATININE 1.1 05/18/2012 1246      Component Value Date/Time   CALCIUM 9.5 06/01/2012 1145   CALCIUM 8.9 05/18/2012 1246   ALKPHOS 143 05/18/2012 1246   ALKPHOS 65 08/22/2011 0904   AST 30 05/18/2012 1246   AST 19 08/22/2011 0904   ALT 47 05/18/2012 1246   ALT 27 08/22/2011 0904   BILITOT 0.56 05/18/2012 1246   BILITOT 0.5 08/22/2011 0904    FINAL DIAGNOSIS Diagnosis Bone Marrow, Aspirate,Biopsy, and Clot, right iliac bone - HYPERCELLULAR BONE MARROW WITH EXTENSIVE INVOLVEMENT  BY CHRONIC LYMPHOCYTIC LEUKEMIA. PERIPHERAL BLOOD: - CHRONIC LYMPHOCYTIC LEUKEMIA. [name] MD Pathologist, Electronic Signature (Case signed 11/22/2011) GROSS AND MICROSCOPIC INFORMATION Specimen Clinical Information hx CLL [jl] Source Bone Marrow, Aspirate,Biopsy, and Clot, right iliac bone Microscopic LAB DATA: CBC performed on 11/18/11 shows: WBC 80.87 K/ul Neutrophils 5% HB 15.7 g/dl Lymphocytes 82% HCT 95.6 % Monocytes 1% MCV 96.0 fL Eosinophils 0% RDW 15.4 % Basophils 0% PLT 81 K/ul PERIPHERAL BLOOD SMEAR: The red blood cells display mild anisopoikilocytosis with minimal polychromasia. The white blood cells are markedly increased in number with lymphocytosis. The lymphocytes primarily consist of small lymphoid cells with generally high nuclear cytoplasmic ratio, dense chromatin with block type clumping, and inconspicuous nucleoli. There are numerous smudge cells in the 1 of 3 FINAL for VINCENT, STREATER [id number]) Microscopic(continued) background. A significant prolymphocytic component is not present. The platelets are decreased in number. BONE MARROW ASPIRATE: Erythroid precursors: Relatively decreased with progressive maturation. Granulocytic precursors: Relatively decreased with progressive maturation. Megakaryocytes: Scattered forms are seen displaying normal morphology. Lymphocytes/plasma cells: The lymphocytes are markedly increased in number representing 95% of all cells and  consist of small lymphoid cells with high nuclear cytoplasmic ratio, dense chromatin and inconspicuous nucleoli. A significant prolymphocytic or large lymphoid cell component is not present. Significant plasma cell aggregates are not present. TOUCH PREPARATIONS: Predominance of small lymphoid cells. CLOT and BIOPSY: The sections show 90% cellularity with extensive infiltration of the medullary space by numerous aggregates, interstitial infiltrates and diffuse sheets of primarily small lymphoid cells displaying high nuclear cytoplasmic ratio, dense chromatin and small to inconspicuous nucleoli. Scattered small proliferation centers are seen. Myeloid hematopoiesis appears decreased in the background. Immunohistochemical stains for CD20, CD79a, CD3, CD5, CD10 and cyclin D1 were performed with appropriate controls. The lymphoid population is stains positively ffor CD79a (B cell marker) with patchy weaker positivity for CD20. There is patchy weak coexpression of CD5. No CD10 or cyclin D1 expression is identified. IRON STAIN: Iron stains are performed on a bone marrow aspirate smear and section of clot. The controls stained appropriately. Storage Iron: Decreased. Ringed Sideroblasts: Absent. ADDITIONAL DATA / TESTING: Specimen was sent for cytogenetic analysis and a separate report will follow. Flow cytometric analysis of the lymphoid population ([id number]) shows a major B cell population expressing pan B cell antigens including CD20 and CD23 associated with coexpression of CD5. This population shows extremely dim/negative staining for surface immunoglobulin light chains. (BNS:gt, 11/21/11) Specimen Table Bone Marrow count performed on 500 cells shows: Blasts: 0% Myeloid 3% Promyelocyts: 0% Myelocytes: 0% Erythroid 2% Metamyelocyts: 1% Bands: 1% Lymphocytes: 95% Neutrophils: 1% Eosinophils: 0% Plasma Cells: 0% Basophils: 0% Monocytes: 0% M:E ratio: 1.5 Gross Received in [facility] are tissue  fragments which aggregate 0.4 x 0.4 x 0.1 cm. The specimen is submitted in toto. Received in a separate container in [facility] are two cores of bone measuring 2.2 and 2.8 cm in length and each 0.2 cm in diameter. The specimen is submitted in toto following decalcification. (GP:eps 11/18/11) 2 of   RADIOGRAPHIC STUDIES: CT CHEST  Findings: Bilateral axillary lymphadenopathy is slightly  progressed compared to prior. For example, index node in the  right axilla measures 15 mm short axis (image 14) compared to 9 mm  on prior. Adjacent rounded 20 mm lymph node (#8) compares to 13 mm  on prior. There are clustered small supraclavicular lymph nodes  which also appears slightly enlarged. Within the mediastinum small  paratracheal lymph nodes are similar. No pericardial fluid.  Esophagus is  normal. Coronary artery stent is noted.  Review of the lung parenchyma demonstrates no new suspicious  pulmonary nodules.  IMPRESSION:  1. Mild continued progressive enlargement of axillary  lymphadenopathy.  2. Stable supraclavicular and mediastinal adenopathy.  CT ABDOMEN AND PELVIS  Findings: Retroperitoneal lymphadenopathy is slightly progressed  compared to prior. For example conglomerate of nodes left of the  aorta measures 26 mm short axis compared to 22 mm on prior (image  71). Periportal lymph node measures 23 mm compared to 20 mm on  prior.  The spleen is massively enlarged with a calculated volume of 2362  ml compared to 1972 ml on prior.  Bilateral external iliac lymph nodes are increased in volume. Left  node measures 19mm short axis compared to 13 on prior. Inguinal  adenopathy is also increased.  The liver, liver is normal. Gallbladder is absent. The spleen,  adrenal glands, kidneys are normal.  The colon rectosigmoid colon are normal.  The bladder prostate gland normal. No pelvic fluid.  IMPRESSION:  1. Progressive increase in retroperitoneal, iliac, and inguinal  lymphadenopathy.  2.  Interval increase in massive splenomegaly.     ASSESSMENT: 62 year old gentleman with  #1 CLL. A CBC done today now shows the count to be lower than it had been previously platelets are lower,  he does not have any evidence of autoimmune hemolytic anemia. I do think that he remains quite stable. The fact that he has not had any infections there is no fatigue there is no hemolysis I think we could continue to observe him for now. I have discussed this with the patient as well as his wife.  #2 CT scans do reveal some progressive lymphadenopathy. Also he does have massive splenomegaly which is concerning. We discussed symptoms of splenic rupture. He knows to call me with any problems.  #3 I have discussed the possibility of starting them on treatment sometime in April or May. I do think it would be feasible to do elective treatment for his CLL. He is having significant thrombocytopenia likely to splenomegaly. Patient's counts are progressively increasing he also has progressive lymphadenopathy. We therefore discussed strongly that we should start him on chemotherapy. However he wants to defer this until June. We discussed FCR that is fludarabine cyclophosphamide and Rituxan. Risks and benefits and side effects of these were discussed. We also discussed logistics of getting a Port-A-Cath placed chemotherapy teaching class.he was also recommended to begin allopurinol Bactrim acyclovir her and we will also check hepatitis B. And C. Panel is.  PLAN:  #1 I will plan on seeing the patient back in one month's time. We will defer treatment at this time.plans on starting the treatment in June 2014. We will plan on doing a FCR.  #2 they know to call me with any problems  All questions were answered. The patient knows to call the clinic with any problems, questions or concerns. We can certainly see the patient much sooner if necessary.  I spent 40 minutes counseling the patient face to face. The total time  spent in the appointment was 30 minutes.  Drue Second, MD Medical/Oncology [facility] [phone] (beeper) [phone] (Office)

## 2012-06-20 ENCOUNTER — Ambulatory Visit (HOSPITAL_BASED_OUTPATIENT_CLINIC_OR_DEPARTMENT_OTHER): Payer: BC Managed Care – PPO

## 2012-06-20 ENCOUNTER — Ambulatory Visit (HOSPITAL_BASED_OUTPATIENT_CLINIC_OR_DEPARTMENT_OTHER): Payer: BC Managed Care – PPO | Admitting: Oncology

## 2012-06-20 ENCOUNTER — Other Ambulatory Visit (HOSPITAL_BASED_OUTPATIENT_CLINIC_OR_DEPARTMENT_OTHER): Payer: BC Managed Care – PPO | Admitting: Lab

## 2012-06-20 ENCOUNTER — Telehealth: Payer: Self-pay | Admitting: Oncology

## 2012-06-20 ENCOUNTER — Encounter: Payer: Self-pay | Admitting: Oncology

## 2012-06-20 VITALS — BP 105/56 | HR 92 | Temp 97.6°F | Resp 18

## 2012-06-20 VITALS — BP 110/68 | HR 92 | Temp 98.1°F | Resp 20 | Ht 72.0 in | Wt 219.0 lb

## 2012-06-20 DIAGNOSIS — C9110 Chronic lymphocytic leukemia of B-cell type not having achieved remission: Secondary | ICD-10-CM

## 2012-06-20 DIAGNOSIS — R599 Enlarged lymph nodes, unspecified: Secondary | ICD-10-CM

## 2012-06-20 DIAGNOSIS — Z5112 Encounter for antineoplastic immunotherapy: Secondary | ICD-10-CM

## 2012-06-20 DIAGNOSIS — R161 Splenomegaly, not elsewhere classified: Secondary | ICD-10-CM

## 2012-06-20 DIAGNOSIS — Z9189 Other specified personal risk factors, not elsewhere classified: Secondary | ICD-10-CM

## 2012-06-20 DIAGNOSIS — Z5111 Encounter for antineoplastic chemotherapy: Secondary | ICD-10-CM

## 2012-06-20 LAB — MANUAL DIFFERENTIAL
ALC: 323.2 10*3/uL — ABNORMAL HIGH (ref 0.9–3.3)
ANC (CHCC manual diff): 6.6 10*3/uL — ABNORMAL HIGH (ref 1.5–6.5)
Band Neutrophils: 0 % (ref 0–10)
Basophil: 0 % (ref 0–2)
Blasts: 0 % (ref 0–0)
LYMPH: 98 % — ABNORMAL HIGH (ref 14–49)
Metamyelocytes: 0 % (ref 0–0)
PROMYELO: 0 % (ref 0–0)
Variant Lymph: 0 % (ref 0–0)
nRBC: 0 % (ref 0–0)

## 2012-06-20 LAB — CBC WITH DIFFERENTIAL/PLATELET
HCT: 27 % — ABNORMAL LOW (ref 38.4–49.9)
MCHC: 32.2 g/dL (ref 32.0–36.0)
RBC: 2.61 10*6/uL — ABNORMAL LOW (ref 4.20–5.82)
WBC: 329.8 10*3/uL (ref 4.0–10.3)

## 2012-06-20 LAB — COMPREHENSIVE METABOLIC PANEL (CC13)
Alkaline Phosphatase: 172 U/L — ABNORMAL HIGH (ref 40–150)
BUN: 16.7 mg/dL (ref 7.0–26.0)
CO2: 23 mEq/L (ref 22–29)
Glucose: 177 mg/dl — ABNORMAL HIGH (ref 70–99)
Sodium: 140 mEq/L (ref 136–145)
Total Bilirubin: 0.55 mg/dL (ref 0.20–1.20)
Total Protein: 5.9 g/dL — ABNORMAL LOW (ref 6.4–8.3)

## 2012-06-20 LAB — LACTATE DEHYDROGENASE (CC13): LDH: 258 U/L — ABNORMAL HIGH (ref 125–245)

## 2012-06-20 MED ORDER — SODIUM CHLORIDE 0.9 % IV SOLN
25.0000 mg/m2 | Freq: Once | INTRAVENOUS | Status: AC
  Administered 2012-06-20: 57.5 mg via INTRAVENOUS
  Filled 2012-06-20: qty 2.3

## 2012-06-20 MED ORDER — ACETAMINOPHEN 325 MG PO TABS
650.0000 mg | ORAL_TABLET | Freq: Once | ORAL | Status: AC
  Administered 2012-06-20: 650 mg via ORAL

## 2012-06-20 MED ORDER — HEPARIN SOD (PORK) LOCK FLUSH 100 UNIT/ML IV SOLN
500.0000 [IU] | Freq: Once | INTRAVENOUS | Status: AC | PRN
  Administered 2012-06-20: 500 [IU]
  Filled 2012-06-20: qty 5

## 2012-06-20 MED ORDER — SODIUM CHLORIDE 0.9 % IV SOLN
Freq: Once | INTRAVENOUS | Status: AC
  Administered 2012-06-20: 09:00:00 via INTRAVENOUS

## 2012-06-20 MED ORDER — SODIUM CHLORIDE 0.9 % IV SOLN
250.0000 mg/m2 | Freq: Once | INTRAVENOUS | Status: AC
  Administered 2012-06-20: 560 mg via INTRAVENOUS
  Filled 2012-06-20: qty 28

## 2012-06-20 MED ORDER — ONDANSETRON 8 MG/50ML IVPB (CHCC)
8.0000 mg | Freq: Once | INTRAVENOUS | Status: AC
  Administered 2012-06-20: 8 mg via INTRAVENOUS

## 2012-06-20 MED ORDER — SODIUM CHLORIDE 0.9 % IJ SOLN
10.0000 mL | INTRAMUSCULAR | Status: DC | PRN
  Administered 2012-06-20: 10 mL
  Filled 2012-06-20: qty 10

## 2012-06-20 MED ORDER — SODIUM CHLORIDE 0.9 % IV SOLN
375.0000 mg/m2 | Freq: Once | INTRAVENOUS | Status: AC
  Administered 2012-06-20: 800 mg via INTRAVENOUS
  Filled 2012-06-20: qty 80

## 2012-06-20 MED ORDER — DIPHENHYDRAMINE HCL 25 MG PO CAPS
50.0000 mg | ORAL_CAPSULE | Freq: Once | ORAL | Status: AC
  Administered 2012-06-20: 50 mg via ORAL

## 2012-06-20 NOTE — Patient Instructions (Addendum)
Proceed with cycle 1 of FCR 5/7 - 5/9  I will see you back on 5/13

## 2012-06-20 NOTE — Patient Instructions (Addendum)
[facility] Discharge Instructions for Patients Receiving Chemotherapy  Today you received the following chemotherapy agents :  Rituxan,  Fludarabine,  Cytoxan.  To help prevent nausea and vomiting after your treatment, we encourage you to take your nausea medication as instructed by your physician.    If you develop nausea and vomiting that is not controlled by your nausea medication, call the clinic. If it is after clinic hours your family physician or the after hours number for the clinic or go to the Emergency Department.   BELOW ARE SYMPTOMS THAT SHOULD BE REPORTED IMMEDIATELY:  *FEVER GREATER THAN 100.5 F  *CHILLS WITH OR WITHOUT FEVER  NAUSEA AND VOMITING THAT IS NOT CONTROLLED WITH YOUR NAUSEA MEDICATION  *UNUSUAL SHORTNESS OF BREATH  *UNUSUAL BRUISING OR BLEEDING  TENDERNESS IN MOUTH AND THROAT WITH OR WITHOUT PRESENCE OF ULCERS  *URINARY PROBLEMS  *BOWEL PROBLEMS  UNUSUAL RASH Items with * indicate a potential emergency and should be followed up as soon as possible.  One of the nurses will contact you 24 hours after your treatment. Please let the nurse know about any problems that you may have experienced. Feel free to call the clinic you have any questions or concerns. The clinic phone number is [phone].   I have been informed and understand all the instructions given to me. I know to contact the clinic, my physician, or go to the Emergency Department if any problems should occur. I do not have any questions at this time, but understand that I may call the clinic during office hours   should I have any questions or need assistance in obtaining follow up care.    __________________________________________  _____________  __________ Signature of Patient or Authorized Representative            Date                   Time    __________________________________________ Nurse's Signature

## 2012-06-20 NOTE — Progress Notes (Signed)
OFFICE PROGRESS NOTE  CC  Ryan Copeland [address] [place] [address]  DIAGNOSIS: 62 year old gentleman with  #1 stage II CLL.now progressive  #2 polycythemia likely secondary now resolved.  PRIOR THERAPY:   #1. Begin chemotherapy today (06/20/12) consisting of FCR days 1 -3  CURRENT THERAPY: FCR days 1 - 3  INTERVAL HISTORY: Ryan Copeland 62 y.o. male returns for followup visit today.his white count is significantly elevated as noted  below. He himself feels well. Although his wife tells me that he is more tired he is sweating a lot.he has not had any weight loss. He denies any nausea vomiting he has no myalgias and arthralgias no easy bruising. No early satiety no bruising no fevers chills or night sweats.  MEDICAL HISTORY: Past Medical History  Diagnosis Date  . 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 Hyperlipidemia   . Tobacco abuse   . PVD (peripheral vascular disease)   . 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  MEDICATIONS:  Current Outpatient Prescriptions  Medication Sig Dispense Refill  . acyclovir (ZOVIRAX) 400 MG tablet Take 1 tablet (400 mg total) by mouth daily.  30 tablet  5  . allopurinol (ZYLOPRIM) 300 MG tablet Take 1 tablet (300 mg total) by mouth daily.  30 tablet  6  . ALPRAZolam (XANAX) 0.25 MG tablet Take 1 po q 6 hours prn for anxiety  90 tablet  1  . aspirin 325 MG tablet Take 325 mg by mouth daily.        Marland Kitchen dexamethasone (DECADRON) 4 MG tablet Take 2 tablets (8 mg total) by mouth 2 (two) times daily with a meal. Take daily starting the day after chemotherapy for 2 days. Take with food.  30 tablet  1  . diphenhydrAMINE (BENADRYL) 25 mg capsule Take 50 mg by mouth at bedtime as needed for itching or sleep.      Marland Kitchen esomeprazole (NEXIUM) 40 MG capsule Take 1 capsule (40 mg total) by mouth 2 (two) times daily.  180 capsule  3  . lidocaine-prilocaine (EMLA)  cream Apply topically as needed. Place cream on port site 1-2 hours prior to treatment.  30 g  2  . LORazepam (ATIVAN) 0.5 MG tablet Take 1 tablet (0.5 mg total) by mouth every 6 (six) hours as needed (Nausea or vomiting).  30 tablet  0  . metFORMIN (GLUCOPHAGE-XR) 500 MG 24 hr tablet Take 500 mg by mouth 2 (two) times daily.      . metoprolol succinate (TOPROL-XL) 50 MG 24 hr tablet Take 50 mg by mouth daily with breakfast. Take with or immediately following a meal.      . nitroGLYCERIN (NITROSTAT) 0.4 MG SL tablet Place 1 tablet (0.4 mg total) under the tongue every 5 (five) minutes as needed.  25 tablet  12  . ondansetron (ZOFRAN) 8 MG tablet Take 1 tablet (8 mg total) by mouth 2 (two) times daily. Take two times a day starting the day after chemo for 2 days. Then take two times a day as needed for nausea or vomiting.  30 tablet  1  . prasugrel (EFFIENT) 10 MG TABS Take 10 mg by mouth daily with breakfast.      . pregabalin (LYRICA) 75 MG capsule Take 75-150 mg by mouth 4 (four) times daily. 1 cap three times daily and 2 caps at bedtime      .  prochlorperazine (COMPAZINE) 10 MG tablet Take 1 tablet (10 mg total) by mouth every 6 (six) hours as needed (Nausea or vomiting).  30 tablet  1  . prochlorperazine (COMPAZINE) 25 MG suppository Place 1 suppository (25 mg total) rectally every 12 (twelve) hours as needed for nausea.  12 suppository  3  . sulfamethoxazole-trimethoprim (BACTRIM DS,SEPTRA DS) 800-160 MG per tablet Take 1 tablet by mouth 3 (three) times a week.  12 tablet  5  . tetrahydrozoline 0.05 % ophthalmic solution Place 1 drop into both eyes as needed. Red eyes      . topiramate (TOPAMAX) 25 MG capsule Take 25 mg by mouth 2 (two) times daily.      . varenicline (CHANTIX) 0.5 MG tablet Take 1 tablet (0.5 mg total) by mouth 2 (two) times daily.  360 tablet  0   No current facility-administered medications for this visit.   Facility-Administered Medications Ordered in Other Visits   Medication Dose Route Frequency Provider Last Rate Last Dose  . cyclophosphamide (CYTOXAN) 560 mg in sodium chloride 0.9 % 250 mL chemo infusion  250 mg/m2 (Treatment Plan Actual) Intravenous Once Victorino December, MD      . fludarabine (FLUDARA) 57.5 mg in sodium chloride 0.9 % 100 mL chemo infusion  25 mg/m2 (Treatment Plan Actual) Intravenous Once Victorino December, MD      . heparin lock flush 100 unit/mL  500 Units Intracatheter Once PRN Victorino December, MD      . ondansetron (ZOFRAN) IVPB 8 mg  8 mg Intravenous Once Victorino December, MD      . sodium chloride 0.9 % injection 10 mL  10 mL Intracatheter PRN Victorino December, MD        SURGICAL HISTORY:  Past Surgical History  Procedure Laterality Date  . Femoral stents    . Coronary stent placement  May 2011  . Cholecystectomy  2007    REVIEW OF SYSTEMS:   General: fatigue (-), night sweats (-), fever (-), pain (-) Lymph: palpable nodes (-) HEENT: vision changes (-), mucositis (-), gum bleeding (-), epistaxis (-) Cardiovascular: chest pain (-), palpitations (-) Pulmonary: shortness of breath (-), dyspnea on exertion (-), cough (-), hemoptysis (-) GI:  Early satiety (-), melena (-), dysphagia (-), nausea/vomiting (-), diarrhea (-) GU: dysuria (-), hematuria (-), incontinence (-) Musculoskeletal: joint swelling (-), joint pain (-), back pain (-) Neuro: weakness (-), numbness (-), headache (-), confusion (-) Skin: Rash (-), lesions (-), dryness (-) Psych: depression (-), suicidal/homicidal ideation (-), feeling of hopelessness (-)   PHYSICAL EXAMINATION:  BP 110/68  Pulse 92  Temp(Src) 98.1 F (36.7 C) (Oral)  Resp 20  Ht 6' (1.829 m)  Wt 219 lb (99.338 kg)  BMI 29.7 kg/m2 General: Patient is a well appearing male in no acute distress HEENT: PERRLA, sclerae anicteric no conjunctival pallor, MMM Neck: supple, no palpable adenopathy Lungs: clear to auscultation bilaterally, no wheezes, rhonchi, or rales Cardiovascular: regular rate  rhythm, S1, S2, no murmurs, rubs or gallops Abdomen: Soft, non-tender, non-distended, normoactive bowel sounds, no HSM Extremities: warm and well perfused, no clubbing, cyanosis, or edema Skin: No rashes or lesions Neuro: Non-focal ECOG PERFORMANCE STATUS: 0 - Asymptomatic  LABORATORY DATA: Lab Results  Component Value Date   WBC 329.8* 06/20/2012   HGB 8.7* 06/20/2012   HCT 27.0* 06/20/2012   MCV 103.5* 06/20/2012   PLT 46* 06/20/2012      Chemistry      Component Value Date/Time  NA 134* 06/01/2012 1145   NA 137 05/18/2012 1246   K 4.6 06/01/2012 1145   K 4.7 05/18/2012 1246   CL 103 06/01/2012 1145   CL 107 05/18/2012 1246   CO2 24 06/01/2012 1145   CO2 23 05/18/2012 1246   BUN 15 06/01/2012 1145   BUN 18.0 05/18/2012 1246   CREATININE 0.96 06/01/2012 1145   CREATININE 1.1 05/18/2012 1246      Component Value Date/Time   CALCIUM 9.5 06/01/2012 1145   CALCIUM 8.9 05/18/2012 1246   ALKPHOS 143 05/18/2012 1246   ALKPHOS 65 08/22/2011 0904   AST 30 05/18/2012 1246   AST 19 08/22/2011 0904   ALT 47 05/18/2012 1246   ALT 27 08/22/2011 0904   BILITOT 0.56 05/18/2012 1246   BILITOT 0.5 08/22/2011 0904    FINAL DIAGNOSIS Diagnosis Bone Marrow, Aspirate,Biopsy, and Clot, right iliac bone - HYPERCELLULAR BONE MARROW WITH EXTENSIVE INVOLVEMENT BY CHRONIC LYMPHOCYTIC LEUKEMIA. PERIPHERAL BLOOD: - CHRONIC LYMPHOCYTIC LEUKEMIA. Ryan Bruin MD Pathologist, Electronic Signature (Case signed 11/22/2011) GROSS AND MICROSCOPIC INFORMATION Specimen Clinical Information hx CLL [jl] Source Bone Marrow, Aspirate,Biopsy, and Clot, right iliac bone Microscopic LAB DATA: CBC performed on 11/18/11 shows: WBC 80.87 K/ul Neutrophils 5% HB 15.7 g/dl Lymphocytes 16% HCT 10.9 % Monocytes 1% MCV 96.0 fL Eosinophils 0% RDW 15.4 % Basophils 0% PLT 81 K/ul PERIPHERAL BLOOD SMEAR: The red blood cells display mild anisopoikilocytosis with minimal polychromasia. The white blood cells are markedly increased in number with  lymphocytosis. The lymphocytes primarily consist of small lymphoid cells with generally high nuclear cytoplasmic ratio, dense chromatin with block type clumping, and inconspicuous nucleoli. There are numerous smudge cells in the 1 of 3 FINAL for Ryan, Copeland [phone]) Microscopic(continued) background. A significant prolymphocytic component is not present. The platelets are decreased in number. BONE MARROW ASPIRATE: Erythroid precursors: Relatively decreased with progressive maturation. Granulocytic precursors: Relatively decreased with progressive maturation. Megakaryocytes: Scattered forms are seen displaying normal morphology. Lymphocytes/plasma cells: The lymphocytes are markedly increased in number representing 95% of all cells and consist of small lymphoid cells with high nuclear cytoplasmic ratio, dense chromatin and inconspicuous nucleoli. A significant prolymphocytic or large lymphoid cell component is not present. Significant plasma cell aggregates are not present. TOUCH PREPARATIONS: Predominance of small lymphoid cells. CLOT and BIOPSY: The sections show 90% cellularity with extensive infiltration of the medullary space by numerous aggregates, interstitial infiltrates and diffuse sheets of primarily small lymphoid cells displaying high nuclear cytoplasmic ratio, dense chromatin and small to inconspicuous nucleoli. Scattered small proliferation centers are seen. Myeloid hematopoiesis appears decreased in the background. Immunohistochemical stains for CD20, CD79a, CD3, CD5, CD10 and cyclin D1 were performed with appropriate controls. The lymphoid population is stains positively ffor CD79a (B cell marker) with patchy weaker positivity for CD20. There is patchy weak coexpression of CD5. No CD10 or cyclin D1 expression is identified. IRON STAIN: Iron stains are performed on a bone marrow aspirate smear and section of clot. The controls stained appropriately. Storage Iron:  Decreased. Ringed Sideroblasts: Absent. ADDITIONAL DATA / TESTING: Specimen was sent for cytogenetic analysis and a separate report will follow. Flow cytometric analysis of the lymphoid population ([id number]) shows a major B cell population expressing pan B cell antigens including CD20 and CD23 associated with coexpression of CD5. This population shows extremely dim/negative staining for surface immunoglobulin light chains. (BNS:gt, 11/21/11) Specimen Table Bone Marrow count performed on 500 cells shows: Blasts: 0% Myeloid 3% Promyelocyts: 0% Myelocytes: 0% Erythroid 2% Metamyelocyts: 1% Bands:  1% Lymphocytes: 95% Neutrophils: 1% Eosinophils: 0% Plasma Cells: 0% Basophils: 0% Monocytes: 0% M:E ratio: 1.5 Gross Received in [facility] are tissue fragments which aggregate 0.4 x 0.4 x 0.1 cm. The specimen is submitted in toto. Received in a separate container in [facility] are two cores of bone measuring 2.2 and 2.8 cm in length and each 0.2 cm in diameter. The specimen is submitted in toto following decalcification. (GP:eps 11/18/11) 2 of   RADIOGRAPHIC STUDIES: CT CHEST  Findings: Bilateral axillary lymphadenopathy is slightly  progressed compared to prior. For example, index node in the  right axilla measures 15 mm short axis (image 14) compared to 9 mm  on prior. Adjacent rounded 20 mm lymph node (#8) compares to 13 mm  on prior. There are clustered small supraclavicular lymph nodes  which also appears slightly enlarged. Within the mediastinum small  paratracheal lymph nodes are similar. No pericardial fluid.  Esophagus is normal. Coronary artery stent is noted.  Review of the lung parenchyma demonstrates no new suspicious  pulmonary nodules.  IMPRESSION:  1. Mild continued progressive enlargement of axillary  lymphadenopathy.  2. Stable supraclavicular and mediastinal adenopathy.  CT ABDOMEN AND PELVIS  Findings: Retroperitoneal lymphadenopathy is slightly progressed  compared to  prior. For example conglomerate of nodes left of the  aorta measures 26 mm short axis compared to 22 mm on prior (image  71). Periportal lymph node measures 23 mm compared to 20 mm on  prior.  The spleen is massively enlarged with a calculated volume of 2362  ml compared to 1972 ml on prior.  Bilateral external iliac lymph nodes are increased in volume. Left  node measures 19mm short axis compared to 13 on prior. Inguinal  adenopathy is also increased.  The liver, liver is normal. Gallbladder is absent. The spleen,  adrenal glands, kidneys are normal.  The colon rectosigmoid colon are normal.  The bladder prostate gland normal. No pelvic fluid.  IMPRESSION:  1. Progressive increase in retroperitoneal, iliac, and inguinal  lymphadenopathy.  2. Interval increase in massive splenomegaly.     ASSESSMENT: 62 year old gentleman with  #1 CLL progressive now to begin today  #2 CT scans do reveal some progressive lymphadenopathy. Also he does have massive splenomegaly which is concerning. We discussed symptoms of splenic rupture. He knows to call me with any problems.  #3 We discussed FCR that is fludarabine cyclophosphamide and Rituxan. Risks and benefits and side effects of these were discussed. We also discussed logistics of getting a Port-A-Cath placed chemotherapy teaching class.he was also recommended to begin allopurinol Bactrim acyclovir her and we will also check hepatitis B. And C. Panel is.  PLAN:  #1Proceed with FCR today day 1-3  #2. Continue allopurinol daily,anti-emeitcs, we suggested having him hydrate himself well  #3I will see him back in 1 week for follow up  All questions were answered. The patient knows to call the clinic with any problems, questions or concerns. We can certainly see the patient much sooner if necessary.  I spent 25 minutes counseling the patient face to face. The total time spent in the appointment was 30 minutes.  Drue Second,  MD Medical/Oncology [facility] [phone] (beeper) [phone] (Office)

## 2012-06-21 ENCOUNTER — Other Ambulatory Visit: Payer: BC Managed Care – PPO | Admitting: Lab

## 2012-06-21 ENCOUNTER — Ambulatory Visit (HOSPITAL_BASED_OUTPATIENT_CLINIC_OR_DEPARTMENT_OTHER): Payer: BC Managed Care – PPO

## 2012-06-21 VITALS — BP 107/63 | HR 78 | Temp 97.9°F | Resp 18

## 2012-06-21 DIAGNOSIS — Z5111 Encounter for antineoplastic chemotherapy: Secondary | ICD-10-CM

## 2012-06-21 DIAGNOSIS — C9110 Chronic lymphocytic leukemia of B-cell type not having achieved remission: Secondary | ICD-10-CM

## 2012-06-21 MED ORDER — SODIUM CHLORIDE 0.9 % IJ SOLN
10.0000 mL | INTRAMUSCULAR | Status: DC | PRN
  Administered 2012-06-21: 10 mL
  Filled 2012-06-21: qty 10

## 2012-06-21 MED ORDER — SODIUM CHLORIDE 0.9 % IV SOLN
Freq: Once | INTRAVENOUS | Status: AC
  Administered 2012-06-21: 09:00:00 via INTRAVENOUS

## 2012-06-21 MED ORDER — SODIUM CHLORIDE 0.9 % IV SOLN
250.0000 mg/m2 | Freq: Once | INTRAVENOUS | Status: AC
  Administered 2012-06-21: 560 mg via INTRAVENOUS
  Filled 2012-06-21: qty 28

## 2012-06-21 MED ORDER — HEPARIN SOD (PORK) LOCK FLUSH 100 UNIT/ML IV SOLN
500.0000 [IU] | Freq: Once | INTRAVENOUS | Status: AC | PRN
  Administered 2012-06-21: 500 [IU]
  Filled 2012-06-21: qty 5

## 2012-06-21 MED ORDER — SODIUM CHLORIDE 0.9 % IV SOLN
25.0000 mg/m2 | Freq: Once | INTRAVENOUS | Status: AC
  Administered 2012-06-21: 57.5 mg via INTRAVENOUS
  Filled 2012-06-21: qty 2.3

## 2012-06-21 MED ORDER — ONDANSETRON 8 MG/50ML IVPB (CHCC)
8.0000 mg | Freq: Once | INTRAVENOUS | Status: AC
  Administered 2012-06-21: 8 mg via INTRAVENOUS

## 2012-06-21 NOTE — Patient Instructions (Addendum)
Cyclophosphamide injection What is this medicine? CYCLOPHOSPHAMIDE (sye kloe FOSS fa mide) is a chemotherapy drug. It slows the growth of cancer cells. This medicine is used to treat many types of cancer like lymphoma, myeloma, leukemia, breast cancer, and ovarian cancer, to name a few. It is also used to treat nephrotic syndrome in children. This medicine may be used for other purposes; ask your health care provider or pharmacist if you have questions. What should I tell my health care provider before I take this medicine? They need to know if you have any of these conditions: -blood disorders -history of other chemotherapy -history of radiation therapy -infection -kidney disease -liver disease -tumors in the bone marrow -an unusual or allergic reaction to cyclophosphamide, other chemotherapy, other medicines, foods, dyes, or preservatives -pregnant or trying to get pregnant -breast-feeding How should I use this medicine? This drug is usually given as an injection into a vein or muscle or by infusion into a vein. It is administered in a hospital or clinic by a specially trained health care professional. Talk to your pediatrician regarding the use of this medicine in children. While this drug may be prescribed for selected conditions, precautions do apply. Overdosage: If you think you have taken too much of this medicine contact a poison control center or emergency room at once. NOTE: This medicine is only for you. Do not share this medicine with others. What if I miss a dose? It is important not to miss your dose. Call your doctor or health care professional if you are unable to keep an appointment. What may interact with this medicine? Do not take this medicine with any of the following medications: -mibefradil -nalidixic acid This medicine may also interact with the following medications: -doxorubicin -etanercept -medicines to increase blood counts like filgrastim, pegfilgrastim,  sargramostim -medicines that block muscle or nerve pain -St. John's Wort -phenobarbital -succinylcholine chloride -trastuzumab -vaccines Talk to your doctor or health care professional before taking any of these medicines: -acetaminophen -aspirin -ibuprofen -ketoprofen -naproxen This list may not describe all possible interactions. Give your health care provider a list of all the medicines, herbs, non-prescription drugs, or dietary supplements you use. Also tell them if you smoke, drink alcohol, or use illegal drugs. Some items may interact with your medicine. What should I watch for while using this medicine? Visit your doctor for checks on your progress. This drug may make you feel generally unwell. This is not uncommon, as chemotherapy can affect healthy cells as well as cancer cells. Report any side effects. Continue your course of treatment even though you feel ill unless your doctor tells you to stop. Drink water or other fluids as directed. Urinate often, even at night. In some cases, you may be given additional medicines to help with side effects. Follow all directions for their use. Call your doctor or health care professional for advice if you get a fever, chills or sore throat, or other symptoms of a cold or flu. Do not treat yourself. This drug decreases your body's ability to fight infections. Try to avoid being around people who are sick. This medicine may increase your risk to bruise or bleed. Call your doctor or health care professional if you notice any unusual bleeding. Be careful brushing and flossing your teeth or using a toothpick because you may get an infection or bleed more easily. If you have any dental work done, tell your dentist you are receiving this medicine. Avoid taking products that contain aspirin, acetaminophen, ibuprofen, naproxen,  or ketoprofen unless instructed by your doctor. These medicines may hide a fever. Do not become pregnant while taking this  medicine. Women should inform their doctor if they wish to become pregnant or think they might be pregnant. There is a potential for serious side effects to an unborn child. Talk to your health care professional or pharmacist for more information. Do not breast-feed an infant while taking this medicine. Men should inform their doctor if they wish to father a child. This medicine may lower sperm counts. If you are going to have surgery, tell your doctor or health care professional that you have taken this medicine. What side effects may I notice from receiving this medicine? Side effects that you should report to your doctor or health care professional as soon as possible: -allergic reactions like skin rash, itching or hives, swelling of the face, lips, or tongue -low blood counts - this medicine may decrease the number of white blood cells, red blood cells and platelets. You may be at increased risk for infections and bleeding. -signs of infection - fever or chills, cough, sore throat, pain or difficulty passing urine -signs of decreased platelets or bleeding - bruising, pinpoint red spots on the skin, black, tarry stools, blood in the urine -signs of decreased red blood cells - unusually weak or tired, fainting spells, lightheadedness -breathing problems -dark urine -mouth sores -pain, swelling, redness at site where injected -swelling of the ankles, feet, hands -trouble passing urine or change in the amount of urine -weight gain -yellowing of the eyes or skin Side effects that usually do not require medical attention (report to your doctor or health care professional if they continue or are bothersome): -changes in nail or skin color -diarrhea -hair loss -loss of appetite -missed menstrual periods -nausea, vomiting -stomach pain This list may not describe all possible side effects. Call your doctor for medical advice about side effects. You may report side effects to FDA at  1-800-FDA-1088. Where should I keep my medicine? This drug is given in a hospital or clinic and will not be stored at home. NOTE: This sheet is a summary. It may not cover all possible information. If you have questions about this medicine, talk to your doctor, pharmacist, or health care provider.  2013, [employer]. (05/08/2007 2:32:25 PM) Fludarabine injection What is this medicine? FLUDARABINE (floo DARE a been) is a chemotherapy drug. It interferes with the growth of cancer cells. It is usually used to treat chronic lymphocytic leukemia (CLL). This medicine may be used for other purposes; ask your health care provider or pharmacist if you have questions. What should I tell my health care provider before I take this medicine? They need to know if you have any of these conditions: -infection (especially virus infection such as chickenpox or cold sores) -kidney disease -low blood counts like low platelets, red blood cells, white blood cells -an unusual or allergic reaction to fludarabine, other chemotherapy, other medicines, foods, dyes, or preservatives -pregnant or trying to get pregnant -breast-feeding How should I use this medicine? This drug is given as an infusion into a vein. It is administered in a hospital or clinic by a specially trained health care professional. Talk to your pediatrician regarding the use of this medicine in children. Special care may be needed. Overdosage: If you think you have taken too much of this medicine contact a poison control center or emergency room at once. NOTE: This medicine is only for you. Do not share this medicine with  others. What if I miss a dose? It is important not to miss your dose. Call your doctor or health care professional if you are unable to keep an appointment. What may interact with this medicine? Do not take this medicine with any of the following medications: -pentostatin This medicine may also interact with the  following medications: -medicines to increase blood counts like filgrastim, pegfilgrastim, sargramostim -vaccines Talk to your doctor or health care professional before taking any of these medicines: -acetaminophen -aspirin -ibuprofen -naproxen -ketoprofen This list may not describe all possible interactions. Give your health care provider a list of all the medicines, herbs, non-prescription drugs, or dietary supplements you use. Also tell them if you smoke, drink alcohol, or use illegal drugs. Some items may interact with your medicine. What should I watch for while using this medicine? This drug may make you feel generally unwell. This is not uncommon, as chemotherapy can affect healthy cells as well as cancer cells. Report any side effects. Continue your course of treatment even though you feel ill unless your doctor tells you to stop. Call your doctor or health care professional for advice if you get a fever, chills or sore throat, or other symptoms of a cold or flu. Do not treat yourself. This drug decreases your body's ability to fight infections. Try to avoid being around people who are sick. This medicine may increase your risk to bruise or bleed. Call your doctor or health care professional if you notice any unusual bleeding. Be careful brushing and flossing your teeth or using a toothpick because you may get an infection or bleed more easily. If you have any dental work done, tell your dentist you are receiving this medicine. Avoid taking products that contain aspirin, acetaminophen, ibuprofen, naproxen, or ketoprofen unless instructed by your doctor. These medicines may hide a fever. Do not become pregnant while taking this medicine. Women should inform their doctor if they wish to become pregnant or think they might be pregnant. There is a potential for serious side effects to an unborn child. Talk to your health care professional or pharmacist for more information. Do not breast-feed an  infant while taking this medicine. Men should inform their doctors if they wish to father a child. This medicine may lower sperm counts. What side effects may I notice from receiving this medicine? Side effects that you should report to your doctor or health care professional as soon as possible: -allergic reactions like skin rash, itching or hives, swelling of the face, lips, or tongue -low blood counts - this medicine may decrease the number of white blood cells, red blood cells and platelets. You may be at increased risk for infections and bleeding. -signs of infection - fever or chills, cough, sore throat, pain or difficulty passing urine -signs of decreased platelets or bleeding - bruising, pinpoint red spots on the skin, black, tarry stools, nosebleeds -signs of decreased red blood cells - unusually weak or tired, fainting spells, lightheadedness -breathing problems -changes in hearing -changes in vision -confusion -dry cough -mouth sores -muscle weakness -pain, tingling, numbness in the hands or feet -swelling of the ankles, feet, hands -trouble passing urine or change in the amount of urine -yellowing of the eyes or skin Side effects that usually do not require medical attention (report to your doctor or health care professional if they continue or are bothersome): -constipation -diarrhea -hair loss -loss of appetite -nausea, vomiting -trouble sleeping This list may not describe all possible side effects. Call your doctor  for medical advice about side effects. You may report side effects to FDA at 1-800-FDA-1088. Where should I keep my medicine? This drug is given in a hospital or clinic and will not be stored at home. NOTE: This sheet is a summary. It may not cover all possible information. If you have questions about this medicine, talk to your doctor, pharmacist, or health care provider.  2012, [employer]. (06/06/2007 1:47:35 PM)

## 2012-06-22 ENCOUNTER — Other Ambulatory Visit: Payer: BC Managed Care – PPO | Admitting: Lab

## 2012-06-22 ENCOUNTER — Ambulatory Visit (HOSPITAL_BASED_OUTPATIENT_CLINIC_OR_DEPARTMENT_OTHER): Payer: BC Managed Care – PPO

## 2012-06-22 VITALS — BP 111/78 | HR 77 | Temp 98.2°F | Resp 18

## 2012-06-22 DIAGNOSIS — Z5111 Encounter for antineoplastic chemotherapy: Secondary | ICD-10-CM

## 2012-06-22 DIAGNOSIS — C9110 Chronic lymphocytic leukemia of B-cell type not having achieved remission: Secondary | ICD-10-CM

## 2012-06-22 MED ORDER — FLUDARABINE PHOSPHATE CHEMO INJECTION 50 MG
25.0000 mg/m2 | Freq: Once | INTRAVENOUS | Status: AC
  Administered 2012-06-22: 57.5 mg via INTRAVENOUS
  Filled 2012-06-22: qty 2.3

## 2012-06-22 MED ORDER — SODIUM CHLORIDE 0.9 % IJ SOLN
10.0000 mL | INTRAMUSCULAR | Status: DC | PRN
  Administered 2012-06-22: 10 mL
  Filled 2012-06-22: qty 10

## 2012-06-22 MED ORDER — ONDANSETRON 8 MG/50ML IVPB (CHCC)
8.0000 mg | Freq: Once | INTRAVENOUS | Status: AC
  Administered 2012-06-22: 8 mg via INTRAVENOUS

## 2012-06-22 MED ORDER — SODIUM CHLORIDE 0.9 % IV SOLN
Freq: Once | INTRAVENOUS | Status: AC
  Administered 2012-06-22: 09:00:00 via INTRAVENOUS

## 2012-06-22 MED ORDER — SODIUM CHLORIDE 0.9 % IV SOLN
250.0000 mg/m2 | Freq: Once | INTRAVENOUS | Status: AC
  Administered 2012-06-22: 560 mg via INTRAVENOUS
  Filled 2012-06-22: qty 28

## 2012-06-22 MED ORDER — HEPARIN SOD (PORK) LOCK FLUSH 100 UNIT/ML IV SOLN
500.0000 [IU] | Freq: Once | INTRAVENOUS | Status: AC | PRN
  Administered 2012-06-22: 500 [IU]
  Filled 2012-06-22: qty 5

## 2012-06-22 NOTE — Progress Notes (Signed)
Pt here for day 3 chemo.  Wife stated yesterday pm, pt's right arm swollen.  However, pt denied any swelling, denied chest pain, denied SOB.  Nurse noted both arms symmetrical in appearance.  Instructed pt and wife to call office if swelling reoccurs and/or go to ER for further evaluation.

## 2012-06-22 NOTE — Patient Instructions (Signed)
[facility] Discharge Instructions for Patients Receiving Chemotherapy  Today you received the following chemotherapy agents :  Fludarabine, Cytoxan.  To help prevent nausea and vomiting after your treatment, we encourage you to take your nausea medication as instructed by your physician.    If you develop nausea and vomiting that is not controlled by your nausea medication, call the clinic. If it is after clinic hours your family physician or the after hours number for the clinic or go to the Emergency Department.   BELOW ARE SYMPTOMS THAT SHOULD BE REPORTED IMMEDIATELY:  *FEVER GREATER THAN 100.5 F  *CHILLS WITH OR WITHOUT FEVER  NAUSEA AND VOMITING THAT IS NOT CONTROLLED WITH YOUR NAUSEA MEDICATION  *UNUSUAL SHORTNESS OF BREATH  *UNUSUAL BRUISING OR BLEEDING  TENDERNESS IN MOUTH AND THROAT WITH OR WITHOUT PRESENCE OF ULCERS  *URINARY PROBLEMS  *BOWEL PROBLEMS  UNUSUAL RASH Items with * indicate a potential emergency and should be followed up as soon as possible.  One of the nurses will contact you 24 hours after your treatment. Please let the nurse know about any problems that you may have experienced. Feel free to call the clinic you have any questions or concerns. The clinic phone number is [phone].   I have been informed and understand all the instructions given to me. I know to contact the clinic, my physician, or go to the Emergency Department if any problems should occur. I do not have any questions at this time, but understand that I may call the clinic during office hours   should I have any questions or need assistance in obtaining follow up care.    __________________________________________  _____________  __________ Signature of Patient or Authorized Representative            Date                   Time    __________________________________________ Nurse's Signature

## 2012-06-25 ENCOUNTER — Encounter: Payer: Self-pay | Admitting: Oncology

## 2012-06-25 ENCOUNTER — Telehealth: Payer: Self-pay | Admitting: *Deleted

## 2012-06-25 NOTE — Telephone Encounter (Signed)
Patient says he is tired.  Also has had the hiccups for two days.  Uses [facility] in [place] on [address]  Sent a MyChart request asking to see Hospital dentist Dr. Kristin Bruins.  Reports having broken a crown and having another molar along side this molar he needs removed.  Does not have his own dentist.  Will notify providers.  No n/v or trouble with any other signs or symptoms.

## 2012-06-25 NOTE — Telephone Encounter (Signed)
Message copied by Augusto Garbe on Mon Jun 25, 2012  5:24 PM ------      Message from: Normajean Baxter LE      Created: Thu Jun 21, 2012 10:26 AM      Regarding: Chemo f/u call       First time Rituxan, Fludarabine, Cytoxan,  Dr. Newt Lukes,  TB, RN ------

## 2012-06-27 ENCOUNTER — Telehealth: Payer: Self-pay | Admitting: Emergency Medicine

## 2012-06-27 ENCOUNTER — Ambulatory Visit: Payer: BC Managed Care – PPO | Admitting: Oncology

## 2012-06-27 ENCOUNTER — Telehealth: Payer: Self-pay | Admitting: *Deleted

## 2012-06-27 ENCOUNTER — Other Ambulatory Visit: Payer: BC Managed Care – PPO | Admitting: Lab

## 2012-06-27 NOTE — Telephone Encounter (Signed)
Patient called reporting he thinks he has an abcess tooth.  Left side of mouth is swollen.  At work so has not checked his temperature.  "I don't feel like I have a fever."  A crown broke off a molar on the left side and the molar along side it is needing to be pulled.  Has left message and awaiting return call.  Does not wish to go to an urgent care.  Will notify providers.   Can be reached at [phone].

## 2012-06-27 NOTE — Telephone Encounter (Signed)
Left message on voicemail for patient notifying him that Dr Welton Flakes is aware of his current dental issues and that she would like to see him on 5/15 at 1:00 for labs and 1:30 for MD visit.  Requested patient call back to confirm.

## 2012-06-28 ENCOUNTER — Telehealth: Payer: Self-pay | Admitting: Oncology

## 2012-06-28 ENCOUNTER — Other Ambulatory Visit (HOSPITAL_BASED_OUTPATIENT_CLINIC_OR_DEPARTMENT_OTHER): Payer: BC Managed Care – PPO | Admitting: Lab

## 2012-06-28 ENCOUNTER — Ambulatory Visit (HOSPITAL_BASED_OUTPATIENT_CLINIC_OR_DEPARTMENT_OTHER): Payer: BC Managed Care – PPO | Admitting: Oncology

## 2012-06-28 DIAGNOSIS — K047 Periapical abscess without sinus: Secondary | ICD-10-CM

## 2012-06-28 DIAGNOSIS — C9110 Chronic lymphocytic leukemia of B-cell type not having achieved remission: Secondary | ICD-10-CM

## 2012-06-28 LAB — CBC WITH DIFFERENTIAL/PLATELET
Basophils Absolute: 0 10*3/uL (ref 0.0–0.1)
Eosinophils Absolute: 0.1 10*3/uL (ref 0.0–0.5)
HGB: 9.8 g/dL — ABNORMAL LOW (ref 13.0–17.1)
MCV: 102.1 fL — ABNORMAL HIGH (ref 79.3–98.0)
MONO#: 0.3 10*3/uL (ref 0.1–0.9)
MONO%: 1.1 % (ref 0.0–14.0)
NEUT#: 1.2 10*3/uL — ABNORMAL LOW (ref 1.5–6.5)
RBC: 2.71 10*6/uL — ABNORMAL LOW (ref 4.20–5.82)
RDW: 19.3 % — ABNORMAL HIGH (ref 11.0–14.6)
WBC: 25.7 10*3/uL — ABNORMAL HIGH (ref 4.0–10.3)
lymph#: 24.1 10*3/uL — ABNORMAL HIGH (ref 0.9–3.3)

## 2012-06-28 LAB — COMPREHENSIVE METABOLIC PANEL (CC13)
ALT: 67 U/L — ABNORMAL HIGH (ref 0–55)
Albumin: 3.4 g/dL — ABNORMAL LOW (ref 3.5–5.0)
CO2: 22 mEq/L (ref 22–29)
Calcium: 8.8 mg/dL (ref 8.4–10.4)
Chloride: 105 mEq/L (ref 98–107)
Glucose: 342 mg/dl — ABNORMAL HIGH (ref 70–99)
Potassium: 4.9 mEq/L (ref 3.5–5.1)
Sodium: 135 mEq/L — ABNORMAL LOW (ref 136–145)
Total Protein: 6.2 g/dL — ABNORMAL LOW (ref 6.4–8.3)

## 2012-06-28 LAB — LACTATE DEHYDROGENASE (CC13): LDH: 157 U/L (ref 125–245)

## 2012-06-28 LAB — TECHNOLOGIST REVIEW

## 2012-06-28 MED ORDER — OXYCODONE HCL 5 MG PO TABS: 5.0000 mg | ORAL_TABLET | ORAL | Status: DC | PRN

## 2012-06-28 MED ORDER — AMOXICILLIN-POT CLAVULANATE 875-125 MG PO TABS: 1.0000 | ORAL_TABLET | Freq: Two times a day (BID) | ORAL | Status: DC

## 2012-06-28 NOTE — Progress Notes (Signed)
OFFICE PROGRESS NOTE  CC  [name] [address] [place] [address]  DIAGNOSIS: 62 year old gentleman with  #1 stage II CLL.now progressive  #2 polycythemia likely secondary now resolved.  PRIOR THERAPY:   #1. Begin chemotherapy today (06/20/12) consisting of FCR days 1 -3  CURRENT THERAPY: Status post FCR days 1 - 3(5/7 through 5/9)  INTERVAL HISTORY: Ryan Copeland 62 y.o. male returns for urgent visit. He is now status post cycle 1 of FCR. Overall he tolerated well his lymphadenopathy has reduced in size. Unfortunately patient did develop a left lower dental abscess. It is painful tender his cheek is swollen. There is no pus drainage. I recommended antibiotics. And referral to dentistry. He has not had any fevers chills or night sweats. But he is neutropenic. Remainder of the review of systems is negative.  MEDICAL HISTORY: Past Medical History  Diagnosis Date  . 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 Hyperlipidemia   . Tobacco abuse   . PVD (peripheral vascular disease)   . 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  MEDICATIONS:  Current Outpatient Prescriptions  Medication Sig Dispense Refill  . acyclovir (ZOVIRAX) 400 MG tablet Take 1 tablet (400 mg total) by mouth daily.  30 tablet  5  . allopurinol (ZYLOPRIM) 300 MG tablet Take 1 tablet (300 mg total) by mouth daily.  30 tablet  6  . ALPRAZolam (XANAX) 0.25 MG tablet Take 1 po q 6 hours prn for anxiety  90 tablet  1  . aspirin 325 MG tablet Take 325 mg by mouth daily.        Marland Kitchen dexamethasone (DECADRON) 4 MG tablet Take 2 tablets (8 mg total) by mouth 2 (two) times daily with a meal. Take daily starting the day after chemotherapy for 2 days. Take with food.  30 tablet  1  . diphenhydrAMINE (BENADRYL) 25 mg capsule Take 50 mg by mouth at bedtime as needed for itching or sleep.      Marland Kitchen esomeprazole (NEXIUM) 40 MG capsule  Take 1 capsule (40 mg total) by mouth 2 (two) times daily.  180 capsule  3  . lidocaine-prilocaine (EMLA) cream Apply topically as needed. Place cream on port site 1-2 hours prior to treatment.  30 g  2  . LORazepam (ATIVAN) 0.5 MG tablet Take 1 tablet (0.5 mg total) by mouth every 6 (six) hours as needed (Nausea or vomiting).  30 tablet  0  . metFORMIN (GLUCOPHAGE-XR) 500 MG 24 hr tablet Take 500 mg by mouth 2 (two) times daily.      . metoprolol succinate (TOPROL-XL) 50 MG 24 hr tablet Take 50 mg by mouth daily with breakfast. Take with or immediately following a meal.      . nitroGLYCERIN (NITROSTAT) 0.4 MG SL tablet Place 1 tablet (0.4 mg total) under the tongue every 5 (five) minutes as needed.  25 tablet  12  . ondansetron (ZOFRAN) 8 MG tablet Take 1 tablet (8 mg total) by mouth 2 (two) times daily. Take two times a day starting the day after chemo for 2 days. Then take two times a day as needed for nausea or vomiting.  30 tablet  1  . prasugrel (EFFIENT) 10 MG TABS Take 10 mg by mouth daily with breakfast.      . pregabalin (LYRICA) 75 MG capsule Take 75-150 mg by mouth 4 (four) times daily. 1 cap  three times daily and 2 caps at bedtime      . prochlorperazine (COMPAZINE) 25 MG suppository Place 1 suppository (25 mg total) rectally every 12 (twelve) hours as needed for nausea.  12 suppository  3  . sulfamethoxazole-trimethoprim (BACTRIM DS,SEPTRA DS) 800-160 MG per tablet Take 1 tablet by mouth 3 (three) times a week.  12 tablet  5  . tetrahydrozoline 0.05 % ophthalmic solution Place 1 drop into both eyes as needed. Red eyes      . topiramate (TOPAMAX) 25 MG capsule Take 25 mg by mouth 2 (two) times daily.      . varenicline (CHANTIX) 0.5 MG tablet Take 1 tablet (0.5 mg total) by mouth 2 (two) times daily.  360 tablet  0  . amoxicillin-clavulanate (AUGMENTIN) 875-125 MG per tablet Take 1 tablet by mouth 2 (two) times daily.  14 tablet  0  . oxyCODONE (OXY IR/ROXICODONE) 5 MG immediate release  tablet Take 1 tablet (5 mg total) by mouth every 4 (four) hours as needed for pain.  30 tablet  0  . prochlorperazine (COMPAZINE) 10 MG tablet Take 1 tablet (10 mg total) by mouth every 6 (six) hours as needed (Nausea or vomiting).  30 tablet  1   No current facility-administered medications for this visit.    SURGICAL HISTORY:  Past Surgical History  Procedure Laterality Date  . Femoral stents    . Coronary stent placement  May 2011  . Cholecystectomy  2007    REVIEW OF SYSTEMS:   General: fatigue (-), night sweats (-), fever (-), pain (-) Lymph: palpable nodes (-) HEENT: vision changes (-), mucositis (-), gum bleeding (-), epistaxis (-) Cardiovascular: chest pain (-), palpitations (-) Pulmonary: shortness of breath (-), dyspnea on exertion (-), cough (-), hemoptysis (-) GI:  Early satiety (-), melena (-), dysphagia (-), nausea/vomiting (-), diarrhea (-) GU: dysuria (-), hematuria (-), incontinence (-) Musculoskeletal: joint swelling (-), joint pain (-), back pain (-) Neuro: weakness (-), numbness (-), headache (-), confusion (-) Skin: Rash (-), lesions (-), dryness (-) Psych: depression (-), suicidal/homicidal ideation (-), feeling of hopelessness (-)   PHYSICAL EXAMINATION:  There were no vitals taken for this visit. General: Patient is a well appearing male in no acute distress HEENT: PERRLA, sclerae anicteric no conjunctival pallor, MMM Neck: supple, no palpable adenopathy Lungs: clear to auscultation bilaterally, no wheezes, rhonchi, or rales Cardiovascular: regular rate rhythm, S1, S2, no murmurs, rubs or gallops Abdomen: Soft, non-tender, non-distended, normoactive bowel sounds, no HSM Extremities: warm and well perfused, no clubbing, cyanosis, or edema Skin: No rashes or lesions Neuro: Non-focal ECOG PERFORMANCE STATUS: 0 - Asymptomatic  LABORATORY DATA: Lab Results  Component Value Date   WBC 25.7* 06/28/2012   HGB 9.8* 06/28/2012   HCT 27.6* 06/28/2012   MCV  102.1* 06/28/2012   PLT 25* 06/28/2012      Chemistry      Component Value Date/Time   NA 135* 06/28/2012 1259   NA 134* 06/01/2012 1145   K 4.9 06/28/2012 1259   K 4.6 06/01/2012 1145   CL 105 06/28/2012 1259   CL 103 06/01/2012 1145   CO2 22 06/28/2012 1259   CO2 24 06/01/2012 1145   BUN 26.0 06/28/2012 1259   BUN 15 06/01/2012 1145   CREATININE 1.4* 06/28/2012 1259   CREATININE 0.96 06/01/2012 1145      Component Value Date/Time   CALCIUM 8.8 06/28/2012 1259   CALCIUM 9.5 06/01/2012 1145   ALKPHOS 155* 06/28/2012 1259  ALKPHOS 65 08/22/2011 0904   AST 22 06/28/2012 1259   AST 19 08/22/2011 0904   ALT 67* 06/28/2012 1259   ALT 27 08/22/2011 0904   BILITOT 0.49 06/28/2012 1259   BILITOT 0.5 08/22/2011 0904    FINAL DIAGNOSIS Diagnosis Bone Marrow, Aspirate,Biopsy, and Clot, right iliac bone - HYPERCELLULAR BONE MARROW WITH EXTENSIVE INVOLVEMENT BY CHRONIC LYMPHOCYTIC LEUKEMIA. PERIPHERAL BLOOD: - CHRONIC LYMPHOCYTIC LEUKEMIA. [name] MD Pathologist, Electronic Signature (Case signed 11/22/2011) GROSS AND MICROSCOPIC INFORMATION Specimen Clinical Information hx CLL [jl] Source Bone Marrow, Aspirate,Biopsy, and Clot, right iliac bone Microscopic LAB DATA: CBC performed on 11/18/11 shows: WBC 80.87 K/ul Neutrophils 5% HB 15.7 g/dl Lymphocytes 09% HCT 81.1 % Monocytes 1% MCV 96.0 fL Eosinophils 0% RDW 15.4 % Basophils 0% PLT 81 K/ul PERIPHERAL BLOOD SMEAR: The red blood cells display mild anisopoikilocytosis with minimal polychromasia. The white blood cells are markedly increased in number with lymphocytosis. The lymphocytes primarily consist of small lymphoid cells with generally high nuclear cytoplasmic ratio, dense chromatin with block type clumping, and inconspicuous nucleoli. There are numerous smudge cells in the 1 of 3 FINAL for ZYIER, DYKEMA [phone]) Microscopic(continued) background. A significant prolymphocytic component is not present. The platelets are decreased in  number. BONE MARROW ASPIRATE: Erythroid precursors: Relatively decreased with progressive maturation. Granulocytic precursors: Relatively decreased with progressive maturation. Megakaryocytes: Scattered forms are seen displaying normal morphology. Lymphocytes/plasma cells: The lymphocytes are markedly increased in number representing 95% of all cells and consist of small lymphoid cells with high nuclear cytoplasmic ratio, dense chromatin and inconspicuous nucleoli. A significant prolymphocytic or large lymphoid cell component is not present. Significant plasma cell aggregates are not present. TOUCH PREPARATIONS: Predominance of small lymphoid cells. CLOT and BIOPSY: The sections show 90% cellularity with extensive infiltration of the medullary space by numerous aggregates, interstitial infiltrates and diffuse sheets of primarily small lymphoid cells displaying high nuclear cytoplasmic ratio, dense chromatin and small to inconspicuous nucleoli. Scattered small proliferation centers are seen. Myeloid hematopoiesis appears decreased in the background. Immunohistochemical stains for CD20, CD79a, CD3, CD5, CD10 and cyclin D1 were performed with appropriate controls. The lymphoid population is stains positively ffor CD79a (B cell marker) with patchy weaker positivity for CD20. There is patchy weak coexpression of CD5. No CD10 or cyclin D1 expression is identified. IRON STAIN: Iron stains are performed on a bone marrow aspirate smear and section of clot. The controls stained appropriately. Storage Iron: Decreased. Ringed Sideroblasts: Absent. ADDITIONAL DATA / TESTING: Specimen was sent for cytogenetic analysis and a separate report will follow. Flow cytometric analysis of the lymphoid population ([id number]) shows a major B cell population expressing pan B cell antigens including CD20 and CD23 associated with coexpression of CD5. This population shows extremely dim/negative staining for surface  immunoglobulin light chains. (BNS:gt, 11/21/11) Specimen Table Bone Marrow count performed on 500 cells shows: Blasts: 0% Myeloid 3% Promyelocyts: 0% Myelocytes: 0% Erythroid 2% Metamyelocyts: 1% Bands: 1% Lymphocytes: 95% Neutrophils: 1% Eosinophils: 0% Plasma Cells: 0% Basophils: 0% Monocytes: 0% M:E ratio: 1.5 Gross Received in [facility] are tissue fragments which aggregate 0.4 x 0.4 x 0.1 cm. The specimen is submitted in toto. Received in a separate container in [facility] are two cores of bone measuring 2.2 and 2.8 cm in length and each 0.2 cm in diameter. The specimen is submitted in toto following decalcification. (GP:eps 11/18/11) 2 of   RADIOGRAPHIC STUDIES: CT CHEST  Findings: Bilateral axillary lymphadenopathy is slightly  progressed compared to prior. For example, index node in  the  right axilla measures 15 mm short axis (image 14) compared to 9 mm  on prior. Adjacent rounded 20 mm lymph node (#8) compares to 13 mm  on prior. There are clustered small supraclavicular lymph nodes  which also appears slightly enlarged. Within the mediastinum small  paratracheal lymph nodes are similar. No pericardial fluid.  Esophagus is normal. Coronary artery stent is noted.  Review of the lung parenchyma demonstrates no new suspicious  pulmonary nodules.  IMPRESSION:  1. Mild continued progressive enlargement of axillary  lymphadenopathy.  2. Stable supraclavicular and mediastinal adenopathy.  CT ABDOMEN AND PELVIS  Findings: Retroperitoneal lymphadenopathy is slightly progressed  compared to prior. For example conglomerate of nodes left of the  aorta measures 26 mm short axis compared to 22 mm on prior (image  71). Periportal lymph node measures 23 mm compared to 20 mm on  prior.  The spleen is massively enlarged with a calculated volume of 2362  ml compared to 1972 ml on prior.  Bilateral external iliac lymph nodes are increased in volume. Left  node measures 19mm short axis  compared to 13 on prior. Inguinal  adenopathy is also increased.  The liver, liver is normal. Gallbladder is absent. The spleen,  adrenal glands, kidneys are normal.  The colon rectosigmoid colon are normal.  The bladder prostate gland normal. No pelvic fluid.  IMPRESSION:  1. Progressive increase in retroperitoneal, iliac, and inguinal  lymphadenopathy.  2. Interval increase in massive splenomegaly.     ASSESSMENT: 62 year old gentleman with  #1 CLL progressive now to begin today patient is now status post cycle 1 of FCR given on 06/22/2012  #2 CT scans do reveal some progressive lymphadenopathy. Also he does have massive splenomegaly which is concerning. We discussed symptoms of splenic rupture. He knows to call me with any problems.  #3 We discussed FCR that is fludarabine cyclophosphamide and Rituxan. Risks and benefits and side effects of these were discussed. We also discussed logistics of getting a Port-A-Cath placed chemotherapy teaching class.he was also recommended to begin allopurinol Bactrim acyclovir her and we will also check hepatitis B. And C. Panel   #4 dental abscess: I have called a dental office here at [facility] long they will call him with an appointment. In the meantime I have started him on Augmentin twice a day.  #5 pain due to dental abscess: His pain is at a 8/10. I've given him OxyIR for pain control.  #6 neutropenia  PLAN:  #1 referral to dental medicine.  #2 begin Augmentin twice a day.  #3 patient will call us if he develops any fevers chills  #4 I will see the patient back in 3 weeks' time.  All questions were answered. The patient knows to call the clinic with any problems, questions or concerns. We can certainly see the patient much sooner if necessary.  I spent 25 minutes counseling the patient face to face. The total time spent in the appointment was 30 minutes.  Drue Second, MD Medical/Oncology [facility] [phone]  (beeper) [phone] (Office)

## 2012-06-28 NOTE — Patient Instructions (Addendum)
Take augmentin twice a day  I will see you back in back in 1 week  Dental Abscess A dental abscess is a collection of infected fluid (pus) from a bacterial infection in the inner part of the tooth (pulp). It usually occurs at the end of the tooth's root.  CAUSES   Severe tooth decay.  Trauma to the tooth that allows bacteria to enter into the pulp, such as a broken or chipped tooth. SYMPTOMS   Severe pain in and around the infected tooth.  Swelling and redness around the abscessed tooth or in the mouth or face.  Tenderness.  Pus drainage.  Bad breath.  Bitter taste in the mouth.  Difficulty swallowing.  Difficulty opening the mouth.  Nausea.  Vomiting.  Chills.  Swollen neck glands. DIAGNOSIS   A medical and dental history will be taken.  An examination will be performed by tapping on the abscessed tooth.  X-rays may be taken of the tooth to identify the abscess. TREATMENT The goal of treatment is to eliminate the infection. You may be prescribed antibiotic medicine to stop the infection from spreading. A root canal may be performed to save the tooth. If the tooth cannot be saved, it may be pulled (extracted) and the abscess may be drained.  HOME CARE INSTRUCTIONS  Only take over-the-counter or prescription medicines for pain, fever, or discomfort as directed by your caregiver.  Rinse your mouth (gargle) often with salt water ( tsp salt in 8 oz of warm water) to relieve pain or swelling.  Do not drive after taking pain medicine (narcotics).  Do not apply heat to the outside of your face.  Return to your dentist for further treatment as directed. SEEK MEDICAL CARE IF:  Your pain is not helped by medicine.  Your pain is getting worse instead of better. SEEK IMMEDIATE MEDICAL CARE IF:  You have a fever or persistent symptoms for more than 2 3 days.  You have a fever and your symptoms suddenly get worse.  You have chills or a very bad headache.  You  have problems breathing or swallowing.  You have trouble opening your mouth.  You have swelling in the neck or around the eye. Document Released: 01/31/2005 Document Revised: 08/02/2011 Document Reviewed: 05/11/2010 [facility] Patient Information 2013 [place], [place].

## 2012-06-29 ENCOUNTER — Ambulatory Visit: Payer: BC Managed Care – PPO | Admitting: Oncology

## 2012-06-29 ENCOUNTER — Other Ambulatory Visit: Payer: BC Managed Care – PPO | Admitting: Lab

## 2012-07-04 ENCOUNTER — Ambulatory Visit (HOSPITAL_COMMUNITY): Payer: BC Managed Care – PPO | Admitting: Dentistry

## 2012-07-04 ENCOUNTER — Encounter (HOSPITAL_COMMUNITY): Payer: Self-pay | Admitting: Dentistry

## 2012-07-04 DIAGNOSIS — M264 Malocclusion, unspecified: Secondary | ICD-10-CM

## 2012-07-04 DIAGNOSIS — D696 Thrombocytopenia, unspecified: Secondary | ICD-10-CM

## 2012-07-04 DIAGNOSIS — K036 Deposits [accretions] on teeth: Secondary | ICD-10-CM

## 2012-07-04 DIAGNOSIS — K045 Chronic apical periodontitis: Secondary | ICD-10-CM

## 2012-07-04 DIAGNOSIS — K08109 Complete loss of teeth, unspecified cause, unspecified class: Secondary | ICD-10-CM

## 2012-07-04 DIAGNOSIS — K083 Retained dental root: Secondary | ICD-10-CM

## 2012-07-04 DIAGNOSIS — K029 Dental caries, unspecified: Secondary | ICD-10-CM

## 2012-07-04 DIAGNOSIS — K0530 Chronic periodontitis, unspecified: Secondary | ICD-10-CM

## 2012-07-04 DIAGNOSIS — K0401 Reversible pulpitis: Secondary | ICD-10-CM

## 2012-07-04 DIAGNOSIS — K0490 Unspecified diseases of pulp and periapical tissues: Secondary | ICD-10-CM

## 2012-07-04 NOTE — Patient Instructions (Signed)
The patient will require multiple dental extractions with alveoloplasty. I need to discuss the case with Dr. Welton Flakes to determine the need for pre-operative blood or platelet transfusion and the timing of the dental extractions. Patient wishes to proceed with referral to an oral surgeon for the dental extractions. I will assist in coordination of the platelet transfusion with dental extractions with the oral surgeon as needed.  Charlynne Pander, DDS

## 2012-07-04 NOTE — Progress Notes (Signed)
DENTAL CONSULTATION  Date of Consultation:  07/04/2012 Patient Name:   Ryan Copeland Date of Birth:   30-Sep-1950 Medical Record Number: [id number]  VITALS: BP 111/69  Pulse 80  Temp(Src) 98.6 F (37 C) (Oral)   CHIEF COMPLAINT: Patient referred by Dr. Drue Second for dental consultation while on active chemotherapy for chronic lymphocytic leukemia.  HPI: Ryan Copeland is a 62 year old male referred for a dental consultation while on active chemotherapy for chronic lymphocytic leukemia. Patient developed history of upper left quadrant swelling and tooth pain. Patient was seen by Dr. Drue Second on 06/28/2012 and was prescribed Augmentin antibiotic therapy and oxycodone pain medication at that time. Patient was then referred to dental medicine for followup evaluation and treatment as indicated.  Patient has a history of having upper left quadrant tooth pain for the past 7-10 days. Patient indicates that are reached an intensity of 6/10 with dull achy and sometimes sharp pain. Patient indicates that the pain lasts for hours at a time. Patient points to tooth #14 as the offending tooth.  Patient indicates that the patient was prescribed antibiotics along with oxycodone pain medication. Patient indicates that the pain has decreased from a 6/10 intensity to a 0/10 intensity today.    The patient has not been seen by dentist for" a while" .  This is at least 3-4 years. Patient saw a dentist in [place] at that time. Patient cannot remember the name of the dentist.  Patient has not been able to followup with routine dental care due to a history of unemployment and financial limitations.  PMH: Past Medical History  Diagnosis Date  . 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 Hyperlipidemia   . Tobacco abuse   . PVD (peripheral vascular disease)   . 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 Diabetes mellitus     PSH: Past Surgical  History  Procedure Laterality Date  . Femoral stents    . Coronary stent placement  May 2011  . Cholecystectomy  2007    ALLERGIES: Allergies  Allergen Reactions  . Codeine Hives    Pt states he can take a few, more reaction with extended doses.    MEDICATIONS: Current Outpatient Prescriptions  Medication Sig Dispense Refill  . acyclovir (ZOVIRAX) 400 MG tablet Take 1 tablet (400 mg total) by mouth daily.  30 tablet  5  . allopurinol (ZYLOPRIM) 300 MG tablet Take 1 tablet (300 mg total) by mouth daily.  30 tablet  6  . ALPRAZolam (XANAX) 0.25 MG tablet Take 1 po q 6 hours prn for anxiety  90 tablet  1  . amoxicillin-clavulanate (AUGMENTIN) 875-125 MG per tablet Take 1 tablet by mouth 2 (two) times daily.  14 tablet  0  . aspirin 325 MG tablet Take 325 mg by mouth daily.        Marland Kitchen dexamethasone (DECADRON) 4 MG tablet Take 2 tablets (8 mg total) by mouth 2 (two) times daily with a meal. Take daily starting the day after chemotherapy for 2 days. Take with food.  30 tablet  1  . diphenhydrAMINE (BENADRYL) 25 mg capsule Take 50 mg by mouth at bedtime as needed for itching or sleep.      Marland Kitchen esomeprazole (NEXIUM) 40 MG capsule Take 1 capsule (40 mg total) by mouth 2 (two) times daily.  180 capsule  3  . lidocaine-prilocaine (EMLA) cream Apply topically as needed. Place  cream on port site 1-2 hours prior to treatment.  30 g  2  . LORazepam (ATIVAN) 0.5 MG tablet Take 1 tablet (0.5 mg total) by mouth every 6 (six) hours as needed (Nausea or vomiting).  30 tablet  0  . metFORMIN (GLUCOPHAGE-XR) 500 MG 24 hr tablet Take 500 mg by mouth 2 (two) times daily.      . metoprolol succinate (TOPROL-XL) 50 MG 24 hr tablet Take 50 mg by mouth daily with breakfast. Take with or immediately following a meal.      . ondansetron (ZOFRAN) 8 MG tablet Take 1 tablet (8 mg total) by mouth 2 (two) times daily. Take two times a day starting the day after chemo for 2 days. Then take two times a day as needed for nausea  or vomiting.  30 tablet  1  . oxyCODONE (OXY IR/ROXICODONE) 5 MG immediate release tablet Take 1 tablet (5 mg total) by mouth every 4 (four) hours as needed for pain.  30 tablet  0  . pregabalin (LYRICA) 75 MG capsule Take 75-150 mg by mouth 4 (four) times daily. 1 cap three times daily and 2 caps at bedtime      . prochlorperazine (COMPAZINE) 10 MG tablet Take 1 tablet (10 mg total) by mouth every 6 (six) hours as needed (Nausea or vomiting).  30 tablet  1  . prochlorperazine (COMPAZINE) 25 MG suppository Place 1 suppository (25 mg total) rectally every 12 (twelve) hours as needed for nausea.  12 suppository  3  . sulfamethoxazole-trimethoprim (BACTRIM DS,SEPTRA DS) 800-160 MG per tablet Take 1 tablet by mouth 3 (three) times a week.  12 tablet  5  . tetrahydrozoline 0.05 % ophthalmic solution Place 1 drop into both eyes as needed. Red eyes      . varenicline (CHANTIX) 0.5 MG tablet Take 1 tablet (0.5 mg total) by mouth 2 (two) times daily.  360 tablet  0  . nitroGLYCERIN (NITROSTAT) 0.4 MG SL tablet Place 1 tablet (0.4 mg total) under the tongue every 5 (five) minutes as needed.  25 tablet  12  . prasugrel (EFFIENT) 10 MG TABS Take 10 mg by mouth daily with breakfast.      . topiramate (TOPAMAX) 25 MG capsule Take 25 mg by mouth 2 (two) times daily.       No current facility-administered medications for this visit.    LABS: Lab Results  Component Value Date   WBC 25.7* 06/28/2012   HGB 9.8* 06/28/2012   HCT 27.6* 06/28/2012   MCV 102.1* 06/28/2012   PLT 25* 06/28/2012      Component Value Date/Time   NA 135* 06/28/2012 1259   NA 134* 06/01/2012 1145   K 4.9 06/28/2012 1259   K 4.6 06/01/2012 1145   CL 105 06/28/2012 1259   CL 103 06/01/2012 1145   CO2 22 06/28/2012 1259   CO2 24 06/01/2012 1145   GLUCOSE 342* 06/28/2012 1259   GLUCOSE 168* 06/01/2012 1145   BUN 26.0 06/28/2012 1259   BUN 15 06/01/2012 1145   CREATININE 1.4* 06/28/2012 1259   CREATININE 0.96 06/01/2012 1145   CALCIUM 8.8  06/28/2012 1259   CALCIUM 9.5 06/01/2012 1145   GFRNONAA 88* 06/01/2012 1145   GFRAA >90 06/01/2012 1145   Lab Results  Component Value Date   INR 1.16 06/01/2012   INR 1.01 11/18/2011   INR 1.0 08/22/2011   No results found for this basename: PTT    SOCIAL HISTORY: History   Social  History  . Marital Status: Married    Spouse Name: N/A    Number of Children: N/A  . Years of Education: N/A   Occupational History  . Not on file.   Social History Main Topics  . Smoking status: Current Every Day Smoker -- 1.00 packs/day for 43 years    Types: Cigarettes  . Smokeless tobacco: Never Used  . Alcohol Use: 7.2 oz/week    12 Cans of beer per week     Comment: weekly  . Drug Use: No  . Sexually Active: Yes   Other Topics Concern  . Not on file   Social History Narrative  . No narrative on file    FAMILY HISTORY: Family History  Problem Relation Age of Onset  . Lung cancer Mother 52  . Cancer Mother     lung  . Heart failure Father 33  . Heart disease Father      REVIEW OF SYSTEMS: Reviewed with patient and included in dental record.   DENTAL HISTORY: CHIEF COMPLAINT: Patient referred by Dr. Drue Second for dental consultation while on active chemotherapy for chronic lymphocytic leukemia.  HPI: Ryan Copeland is a 62 year old male referred for a dental consultation while on active chemotherapy for chronic lymphocytic leukemia. Patient developed history of upper left quadrant swelling and tooth pain. Patient was seen by Dr. Drue Second on 06/28/2012 and was prescribed Augmentin antibiotic therapy and oxycodone pain medication at that time. Patient was then referred to dental medicine for followup evaluation and treatment as indicated.  Patient has a history of having upper left quadrant tooth pain for the past 7-10 days. Patient indicates that are reached an intensity of 6/10 with dull achy and sometimes sharp pain. Patient indicates that the pain lasts for hours at a  time. Patient points to tooth #14 as the offending tooth.  Patient indicates that the patient was prescribed antibiotics along with oxycodone pain medication. Patient indicates that the pain has decreased from a 6/10 intensity to a 0/10 intensity today.    The patient has not been seen by dentist for" a while" .  This is at least 3-4 years. Patient saw a dentist in [place] at that time. Patient cannot remember the name of the dentist.  Patient has not been able to followup with routine dental care due to a history of unemployment and financial limitations.  DENTAL EXAMINATION:  GENERAL: Patient is a well-developed, well-nourished male in no acute distress. HEAD AND NECK: There is no palpable lymphadenopathy. The patient denies acute TMJ symptoms. There is no facial swelling noted today. INTRAORAL EXAM: Patient has xerostomia. Patient has incipient angular cheilitis involving the right commissure of the mouth. DENTITION: The patient is missing tooth numbers 1, 4, 13, 17, 18, 21, and 32.  There are retained roots noted in the area of numbers 16. PERIODONTAL: Patient has chronic periodontitis with plaque and calculus accumulations, gingival recession, and tooth mobility as charted. The patient has lack attached gingiva in the mandibular anterior area, especially in the area #24 and 25. Patient has moderate bone loss noted. Periodontal charting was deferred today secondary to the significant thrombocytopenia.   DENTAL CARIES/SUBOPTIMAL RESTORATIONS: Patient has multiple dental caries noted. Patient has multiple areas of abfraction / flexure lesions. ENDODONTIC: Patient with a history of acute pulpitis symptoms involving the upper left quadrant. Patient also has chronic apical periodontitis affect in tooth numbers 14, 16, and 30. Previous root canal therapies are associated with tooth numbers  2, 11, 15, 20, and 31. The root canal therapies of tooth numbers 15 and 31 are exposed to the oral  environment. There is no obvious persistent periapical pathology associated with previous root canal therapies in the area of tooth numbers 2, 11, and 20. CROWN AND BRIDGE: Patient has multiple crown or bridge restorations. The margins of many of the crown restorations are less than ideal at this time. PROSTHODONTIC: The patient denies presence of partial dentures. OCCLUSION: The patient has a poor occlusal scheme secondary to multiple missing teeth, supra-eruption and drifting of the unopposed teeth into the edentulous areas, and lack of replacement of all missing teeth with dental prostheses.  RADIOGRAPHIC INTERPRETATION: An orthopantogram was obtained and supplemented with a full series of dental radiographs. There are multiple missing teeth. There is supra-eruption and drifting of the unopposed teeth into the edentulous areas. There is incipient to moderate bone loss. There are multiple areas of periapical pathology and radiolucency. There are multiple dental caries noted. There are multiple retained root segments noted.   ASSESSMENTS: 1. History of acute pulpitis symptoms 2. Chronic apical periodontitis 3. Multiple retained root segments 4. Dental caries 5. Chronic periodontitis with bone loss 6. Gingival recession 7. Lack of attached gingiva in the mandibular anterior area on the facial aspect, especially in the area of numbers 24-25. 8. Xerostomia 9. Incipient angular cheilitis involving the right commissure of the mouth 10. Multiple abfraction and flexure lesions 11.  Multiple previous root canal therapies now exposed to the oral environment (#15 and #31) 12. Less than ideal margins associated with multiple crowns within the mouth at this time.  13. Current chemotherapy with the risk for infection bleeding with invasive dental procedures 14. Thrombocytopenia with potential need for blood products her platelet transfusion prior to invasive dental procedures 15. History of Effient  (prasugrel) drug therapy with risk for bleeding with invasive dental procedures.  PLAN/RECOMMENDATIONS: 1. I discussed the risks, benefits, and complications of various treatment options with the patient in relationship to his medical and dental conditions, current chronic lymphocytic leukemia, thrombocytopenia, and risk for infection and bleeding. We discussed various treatment options to include no treatment, multiple extractions with alveoloplasty, pre-prosthetic surgery as indicated, periodontal therapy, dental restorations, root canal therapy, crown and bridge therapy, implant therapy, and replacement of missing teeth as indicated. The patient currently wishes to proceed with referral to an oral surgeon for extraction of tooth numbers 14, 15, 16, and 31 with alveoloplasty as indicated.  This treatment we'll need to be coordinated with Dr. Drue Second to determine the date that treatment is desired and to determine if blood products or platelet transfusions are required prior to the invasive dental procedures with the oral surgeon.  Patient will then followup for dentist of his choice for routine dental care and treatment once medically stable from the anticipated chemotherapy over the next 6 months.     2. Discussion of findings with medical team and coordination of future medical and dental care.   Charlynne Pander, DDS

## 2012-07-06 ENCOUNTER — Other Ambulatory Visit (HOSPITAL_BASED_OUTPATIENT_CLINIC_OR_DEPARTMENT_OTHER): Payer: BC Managed Care – PPO | Admitting: Lab

## 2012-07-06 ENCOUNTER — Ambulatory Visit (HOSPITAL_BASED_OUTPATIENT_CLINIC_OR_DEPARTMENT_OTHER): Payer: BC Managed Care – PPO | Admitting: Oncology

## 2012-07-06 VITALS — BP 120/72 | HR 98 | Temp 98.1°F | Resp 20 | Ht 72.0 in | Wt 212.6 lb

## 2012-07-06 DIAGNOSIS — C9110 Chronic lymphocytic leukemia of B-cell type not having achieved remission: Secondary | ICD-10-CM

## 2012-07-06 DIAGNOSIS — R599 Enlarged lymph nodes, unspecified: Secondary | ICD-10-CM

## 2012-07-06 DIAGNOSIS — K047 Periapical abscess without sinus: Secondary | ICD-10-CM

## 2012-07-06 DIAGNOSIS — R161 Splenomegaly, not elsewhere classified: Secondary | ICD-10-CM

## 2012-07-06 LAB — COMPREHENSIVE METABOLIC PANEL (CC13)
CO2: 21 mEq/L — ABNORMAL LOW (ref 22–29)
Calcium: 8.5 mg/dL (ref 8.4–10.4)
Creatinine: 0.9 mg/dL (ref 0.7–1.3)
Glucose: 362 mg/dl — ABNORMAL HIGH (ref 70–99)
Total Bilirubin: 0.65 mg/dL (ref 0.20–1.20)

## 2012-07-06 LAB — CBC WITH DIFFERENTIAL/PLATELET
Basophils Absolute: 0 10*3/uL (ref 0.0–0.1)
EOS%: 0.5 % (ref 0.0–7.0)
HCT: 24.6 % — ABNORMAL LOW (ref 38.4–49.9)
HGB: 8 g/dL — ABNORMAL LOW (ref 13.0–17.1)
LYMPH%: 85.7 % — ABNORMAL HIGH (ref 14.0–49.0)
MCH: 33.2 pg (ref 27.2–33.4)
MCV: 102.1 fL — ABNORMAL HIGH (ref 79.3–98.0)
MONO%: 7.8 % (ref 0.0–14.0)
NEUT%: 5.5 % — ABNORMAL LOW (ref 39.0–75.0)
Platelets: 53 10*3/uL — ABNORMAL LOW (ref 140–400)
RDW: 19.5 % — ABNORMAL HIGH (ref 11.0–14.6)

## 2012-07-06 LAB — TECHNOLOGIST REVIEW

## 2012-07-06 MED ORDER — CIPROFLOXACIN HCL 500 MG PO TABS: 500.0000 mg | ORAL_TABLET | Freq: Two times a day (BID) | ORAL | Status: DC

## 2012-07-12 ENCOUNTER — Other Ambulatory Visit: Payer: BC Managed Care – PPO | Admitting: Lab

## 2012-07-13 ENCOUNTER — Encounter (HOSPITAL_COMMUNITY)
Admission: RE | Admit: 2012-07-13 | Discharge: 2012-07-13 | Disposition: A | Discharge status: Home / Self Care | Payer: BC Managed Care – PPO | Source: Ambulatory Visit | Attending: Oncology | Admitting: Oncology

## 2012-07-13 ENCOUNTER — Other Ambulatory Visit: Payer: Self-pay | Admitting: Emergency Medicine

## 2012-07-13 ENCOUNTER — Encounter: Payer: Self-pay | Admitting: Oncology

## 2012-07-13 ENCOUNTER — Other Ambulatory Visit (HOSPITAL_BASED_OUTPATIENT_CLINIC_OR_DEPARTMENT_OTHER): Payer: BC Managed Care – PPO | Admitting: Lab

## 2012-07-13 ENCOUNTER — Ambulatory Visit (HOSPITAL_BASED_OUTPATIENT_CLINIC_OR_DEPARTMENT_OTHER): Payer: BC Managed Care – PPO | Admitting: Lab

## 2012-07-13 ENCOUNTER — Ambulatory Visit (HOSPITAL_COMMUNITY)
Admission: RE | Admit: 2012-07-13 | Discharge: 2012-07-13 | Disposition: A | Discharge status: Home / Self Care | Payer: BC Managed Care – PPO | Source: Ambulatory Visit | Attending: Oncology | Admitting: Oncology

## 2012-07-13 ENCOUNTER — Ambulatory Visit (HOSPITAL_BASED_OUTPATIENT_CLINIC_OR_DEPARTMENT_OTHER): Payer: BC Managed Care – PPO | Admitting: Oncology

## 2012-07-13 ENCOUNTER — Telehealth: Payer: Self-pay | Admitting: Oncology

## 2012-07-13 VITALS — BP 102/64 | HR 81 | Temp 98.0°F | Resp 20 | Ht 72.0 in | Wt 209.9 lb

## 2012-07-13 DIAGNOSIS — Z856 Personal history of leukemia: Secondary | ICD-10-CM | POA: Insufficient documentation

## 2012-07-13 DIAGNOSIS — J189 Pneumonia, unspecified organism: Secondary | ICD-10-CM

## 2012-07-13 DIAGNOSIS — C9110 Chronic lymphocytic leukemia of B-cell type not having achieved remission: Secondary | ICD-10-CM

## 2012-07-13 DIAGNOSIS — K047 Periapical abscess without sinus: Secondary | ICD-10-CM

## 2012-07-13 DIAGNOSIS — R05 Cough: Secondary | ICD-10-CM | POA: Insufficient documentation

## 2012-07-13 DIAGNOSIS — D6481 Anemia due to antineoplastic chemotherapy: Secondary | ICD-10-CM | POA: Insufficient documentation

## 2012-07-13 DIAGNOSIS — R161 Splenomegaly, not elsewhere classified: Secondary | ICD-10-CM

## 2012-07-13 DIAGNOSIS — R059 Cough, unspecified: Secondary | ICD-10-CM | POA: Insufficient documentation

## 2012-07-13 DIAGNOSIS — I517 Cardiomegaly: Secondary | ICD-10-CM | POA: Insufficient documentation

## 2012-07-13 DIAGNOSIS — R0602 Shortness of breath: Secondary | ICD-10-CM | POA: Insufficient documentation

## 2012-07-13 DIAGNOSIS — R509 Fever, unspecified: Secondary | ICD-10-CM | POA: Insufficient documentation

## 2012-07-13 DIAGNOSIS — T451X5A Adverse effect of antineoplastic and immunosuppressive drugs, initial encounter: Secondary | ICD-10-CM | POA: Insufficient documentation

## 2012-07-13 LAB — COMPREHENSIVE METABOLIC PANEL (CC13)
ALT: 67 U/L — ABNORMAL HIGH (ref 0–55)
AST: 24 U/L (ref 5–34)
Albumin: 3.5 g/dL (ref 3.5–5.0)
Alkaline Phosphatase: 111 U/L (ref 40–150)
BUN: 14.2 mg/dL (ref 7.0–26.0)
CO2: 23 meq/L (ref 22–29)
Calcium: 8.4 mg/dL (ref 8.4–10.4)
Chloride: 106 meq/L (ref 98–107)
Creatinine: 1.1 mg/dL (ref 0.7–1.3)
Glucose: 297 mg/dL — ABNORMAL HIGH (ref 70–99)
Potassium: 4.5 meq/L (ref 3.5–5.1)
Sodium: 138 meq/L (ref 136–145)
Total Bilirubin: 0.74 mg/dL (ref 0.20–1.20)
Total Protein: 5.8 g/dL — ABNORMAL LOW (ref 6.4–8.3)

## 2012-07-13 LAB — CBC WITH DIFFERENTIAL/PLATELET
BASO%: 0.4 % (ref 0.0–2.0)
Basophils Absolute: 0 10e3/uL (ref 0.0–0.1)
EOS%: 1 % (ref 0.0–7.0)
Eosinophils Absolute: 0.1 10e3/uL (ref 0.0–0.5)
HCT: 25.8 % — ABNORMAL LOW (ref 38.4–49.9)
HGB: 8.3 g/dL — ABNORMAL LOW (ref 13.0–17.1)
LYMPH%: 77.5 % — ABNORMAL HIGH (ref 14.0–49.0)
MCH: 33.7 pg — ABNORMAL HIGH (ref 27.2–33.4)
MCHC: 32.2 g/dL (ref 32.0–36.0)
MCV: 104.9 fL — ABNORMAL HIGH (ref 79.3–98.0)
MONO#: 0.5 10e3/uL (ref 0.1–0.9)
MONO%: 9.3 % (ref 0.0–14.0)
NEUT#: 0.6 10e3/uL — ABNORMAL LOW (ref 1.5–6.5)
NEUT%: 11.8 % — ABNORMAL LOW (ref 39.0–75.0)
Platelets: 56 10e3/uL — ABNORMAL LOW (ref 140–400)
RBC: 2.46 10e6/uL — ABNORMAL LOW (ref 4.20–5.82)
RDW: 20.1 % — ABNORMAL HIGH (ref 11.0–14.6)
WBC: 4.9 10e3/uL (ref 4.0–10.3)
lymph#: 3.8 10e3/uL — ABNORMAL HIGH (ref 0.9–3.3)
nRBC: 0 % (ref 0–0)

## 2012-07-13 LAB — PREPARE RBC (CROSSMATCH)

## 2012-07-13 LAB — IGG, IGA, IGM: IgM, Serum: 17 mg/dL — ABNORMAL LOW (ref 41–251)

## 2012-07-13 LAB — TECHNOLOGIST REVIEW

## 2012-07-13 LAB — LACTATE DEHYDROGENASE (CC13): LDH: 147 U/L (ref 125–245)

## 2012-07-13 MED ORDER — IPRATROPIUM-ALBUTEROL 20-100 MCG/ACT IN AERS: 1.0000 | INHALATION_SPRAY | Freq: Four times a day (QID) | RESPIRATORY_TRACT | Status: DC

## 2012-07-13 MED ORDER — FLUTICASONE PROPIONATE HFA 110 MCG/ACT IN AERO: 1.0000 | INHALATION_SPRAY | Freq: Two times a day (BID) | RESPIRATORY_TRACT | Status: DC

## 2012-07-13 NOTE — Patient Instructions (Addendum)
#  1 you are anemic: We will go ahead and transfuse you with 2 units of packed red cells.  #2 wheezing: Prescribed inhalers. We will also get a chest x-ray.  #3 neutropenia: Continue Cipro 500 mg twice a day.  #4 I will see you back in 07/17/2012 for followup.

## 2012-07-14 ENCOUNTER — Ambulatory Visit (HOSPITAL_BASED_OUTPATIENT_CLINIC_OR_DEPARTMENT_OTHER): Payer: BC Managed Care – PPO

## 2012-07-14 VITALS — BP 109/61 | HR 66 | Temp 98.7°F | Resp 20

## 2012-07-14 DIAGNOSIS — D6481 Anemia due to antineoplastic chemotherapy: Secondary | ICD-10-CM

## 2012-07-14 DIAGNOSIS — C9110 Chronic lymphocytic leukemia of B-cell type not having achieved remission: Secondary | ICD-10-CM

## 2012-07-14 DIAGNOSIS — T451X5A Adverse effect of antineoplastic and immunosuppressive drugs, initial encounter: Secondary | ICD-10-CM

## 2012-07-14 MED ORDER — DIPHENHYDRAMINE HCL 25 MG PO CAPS
25.0000 mg | ORAL_CAPSULE | Freq: Once | ORAL | Status: AC
  Administered 2012-07-14: 25 mg via ORAL

## 2012-07-14 MED ORDER — SODIUM CHLORIDE 0.9 % IV SOLN
INTRAVENOUS | Status: DC
  Administered 2012-07-14: 09:00:00 via INTRAVENOUS

## 2012-07-14 MED ORDER — HEPARIN SOD (PORK) LOCK FLUSH 100 UNIT/ML IV SOLN
500.0000 [IU] | Freq: Once | INTRAVENOUS | Status: DC
  Filled 2012-07-14: qty 5

## 2012-07-14 MED ORDER — SODIUM CHLORIDE 0.9 % IJ SOLN
10.0000 mL | INTRAMUSCULAR | Status: DC | PRN
  Filled 2012-07-14: qty 10

## 2012-07-14 MED ORDER — ACETAMINOPHEN 325 MG PO TABS
650.0000 mg | ORAL_TABLET | Freq: Once | ORAL | Status: AC
  Administered 2012-07-14: 650 mg via ORAL

## 2012-07-14 NOTE — Patient Instructions (Addendum)
Blood Transfusion  A blood transfusion replaces your blood or some of its parts. Blood is replaced when you have lost blood because of surgery, an accident, or for severe blood conditions like anemia. You can donate blood to be used on yourself if you have a planned surgery. If you lose blood during that surgery, your own blood can be given back to you. Any blood given to you is checked to make sure it matches your blood type. Your temperature, blood pressure, and heart rate (vital signs) will be checked often.  GET HELP RIGHT AWAY IF:   You feel sick to your stomach (nauseous) or throw up (vomit).  You have watery poop (diarrhea).  You have shortness of breath or trouble breathing.  You have blood in your pee (urine) or have dark colored pee.  You have chest pain or tightness.  Your eyes or skin turn yellow (jaundice).  You have a temperature by mouth above 102 F (38.9 C), not controlled by medicine.  You start to shake and have chills.  You develop a a red rash (hives) or feel itchy.  You develop lightheadedness or feel confused.  You develop back, joint, or muscle pain.  You do not feel hungry (lost appetite).  You feel tired, restless, or nervous.  You develop belly (abdominal) cramps. Document Released: 04/29/2008 Document Revised: 04/25/2011 Document Reviewed: 04/29/2008 [employer] Patient Information 2014 [employer].  

## 2012-07-15 LAB — TYPE AND SCREEN
ABO/RH(D): O POS
Antibody Screen: POSITIVE
Unit division: 0

## 2012-07-17 ENCOUNTER — Ambulatory Visit (HOSPITAL_BASED_OUTPATIENT_CLINIC_OR_DEPARTMENT_OTHER): Payer: BC Managed Care – PPO | Admitting: Oncology

## 2012-07-17 ENCOUNTER — Ambulatory Visit (HOSPITAL_BASED_OUTPATIENT_CLINIC_OR_DEPARTMENT_OTHER): Payer: BC Managed Care – PPO

## 2012-07-17 ENCOUNTER — Telehealth: Payer: Self-pay | Admitting: Oncology

## 2012-07-17 ENCOUNTER — Encounter: Payer: Self-pay | Admitting: Oncology

## 2012-07-17 VITALS — BP 108/64 | HR 78 | Temp 97.9°F | Resp 20 | Ht 70.0 in | Wt 206.9 lb

## 2012-07-17 DIAGNOSIS — C9110 Chronic lymphocytic leukemia of B-cell type not having achieved remission: Secondary | ICD-10-CM

## 2012-07-17 DIAGNOSIS — R599 Enlarged lymph nodes, unspecified: Secondary | ICD-10-CM

## 2012-07-17 DIAGNOSIS — K089 Disorder of teeth and supporting structures, unspecified: Secondary | ICD-10-CM

## 2012-07-17 DIAGNOSIS — R161 Splenomegaly, not elsewhere classified: Secondary | ICD-10-CM

## 2012-07-17 LAB — CBC WITH DIFFERENTIAL/PLATELET
Basophils Absolute: 0.1 10*3/uL (ref 0.0–0.1)
Eosinophils Absolute: 0.1 10*3/uL (ref 0.0–0.5)
HGB: 10.1 g/dL — ABNORMAL LOW (ref 13.0–17.1)
MONO#: 0.1 10*3/uL (ref 0.1–0.9)
NEUT#: 0.8 10*3/uL — ABNORMAL LOW (ref 1.5–6.5)
RBC: 2.95 10*6/uL — ABNORMAL LOW (ref 4.20–5.82)
RDW: 21.8 % — ABNORMAL HIGH (ref 11.0–14.6)
WBC: 6.6 10*3/uL (ref 4.0–10.3)

## 2012-07-17 LAB — TECHNOLOGIST REVIEW

## 2012-07-17 LAB — COMPREHENSIVE METABOLIC PANEL (CC13)
Albumin: 3.4 g/dL — ABNORMAL LOW (ref 3.5–5.0)
BUN: 13.2 mg/dL (ref 7.0–26.0)
CO2: 24 mEq/L (ref 22–29)
Calcium: 8.8 mg/dL (ref 8.4–10.4)
Chloride: 110 mEq/L — ABNORMAL HIGH (ref 98–107)
Glucose: 162 mg/dl — ABNORMAL HIGH (ref 70–99)
Potassium: 4.6 mEq/L (ref 3.5–5.1)
Sodium: 141 mEq/L (ref 136–145)
Total Protein: 5.8 g/dL — ABNORMAL LOW (ref 6.4–8.3)

## 2012-07-17 NOTE — Progress Notes (Signed)
Quick Note:  Call patient: labs look good we will see you next week for chemo ______

## 2012-07-17 NOTE — Patient Instructions (Addendum)
Check cbc   Return in 1 week for labs, MD and chemotherapy

## 2012-07-18 ENCOUNTER — Ambulatory Visit: Payer: BC Managed Care – PPO

## 2012-07-18 ENCOUNTER — Telehealth: Payer: Self-pay | Admitting: Medical Oncology

## 2012-07-18 ENCOUNTER — Other Ambulatory Visit: Payer: BC Managed Care – PPO | Admitting: Lab

## 2012-07-18 NOTE — Telephone Encounter (Signed)
Per MD, informed patient lab results look good and confirmed next weeks appt with patient. Patient expressed understanding, no further questions at this time.

## 2012-07-18 NOTE — Telephone Encounter (Signed)
Message copied by Rexene Edison on Wed Jul 18, 2012  3:04 PM ------      Message from: Victorino December      Created: Tue Jul 17, 2012  8:36 PM       Call patient: labs look good we will see you next week for chemo ------

## 2012-07-19 ENCOUNTER — Other Ambulatory Visit: Payer: BC Managed Care – PPO | Admitting: Lab

## 2012-07-19 ENCOUNTER — Ambulatory Visit: Payer: BC Managed Care – PPO

## 2012-07-20 ENCOUNTER — Ambulatory Visit: Payer: BC Managed Care – PPO

## 2012-07-20 ENCOUNTER — Other Ambulatory Visit: Payer: BC Managed Care – PPO | Admitting: Lab

## 2012-07-25 ENCOUNTER — Other Ambulatory Visit (HOSPITAL_BASED_OUTPATIENT_CLINIC_OR_DEPARTMENT_OTHER): Payer: BC Managed Care – PPO | Admitting: Lab

## 2012-07-25 ENCOUNTER — Telehealth: Payer: Self-pay | Admitting: Oncology

## 2012-07-25 ENCOUNTER — Encounter: Payer: Self-pay | Admitting: Oncology

## 2012-07-25 ENCOUNTER — Ambulatory Visit (HOSPITAL_BASED_OUTPATIENT_CLINIC_OR_DEPARTMENT_OTHER): Payer: BC Managed Care – PPO | Admitting: Oncology

## 2012-07-25 ENCOUNTER — Ambulatory Visit (HOSPITAL_BASED_OUTPATIENT_CLINIC_OR_DEPARTMENT_OTHER): Payer: BC Managed Care – PPO

## 2012-07-25 VITALS — BP 104/60 | HR 80 | Temp 97.7°F | Resp 20 | Ht 70.0 in | Wt 209.6 lb

## 2012-07-25 VITALS — BP 98/53 | HR 80 | Temp 99.2°F

## 2012-07-25 DIAGNOSIS — Z5112 Encounter for antineoplastic immunotherapy: Secondary | ICD-10-CM

## 2012-07-25 DIAGNOSIS — C9110 Chronic lymphocytic leukemia of B-cell type not having achieved remission: Secondary | ICD-10-CM

## 2012-07-25 DIAGNOSIS — Z5111 Encounter for antineoplastic chemotherapy: Secondary | ICD-10-CM

## 2012-07-25 LAB — COMPREHENSIVE METABOLIC PANEL (CC13)
ALT: 82 U/L — ABNORMAL HIGH (ref 0–55)
Albumin: 3.4 g/dL — ABNORMAL LOW (ref 3.5–5.0)
CO2: 23 mEq/L (ref 22–29)
Potassium: 4.6 mEq/L (ref 3.5–5.1)
Sodium: 138 mEq/L (ref 136–145)
Total Bilirubin: 0.5 mg/dL (ref 0.20–1.20)
Total Protein: 5.7 g/dL — ABNORMAL LOW (ref 6.4–8.3)

## 2012-07-25 LAB — CBC WITH DIFFERENTIAL/PLATELET
BASO%: 1 % (ref 0.0–2.0)
LYMPH%: 83.3 % — ABNORMAL HIGH (ref 14.0–49.0)
MCHC: 34.4 g/dL (ref 32.0–36.0)
MONO#: 0.2 10*3/uL (ref 0.1–0.9)
NEUT#: 1.3 10*3/uL — ABNORMAL LOW (ref 1.5–6.5)
Platelets: 59 10*3/uL — ABNORMAL LOW (ref 140–400)
RBC: 3.06 10*6/uL — ABNORMAL LOW (ref 4.20–5.82)
RDW: 21.6 % — ABNORMAL HIGH (ref 11.0–14.6)
WBC: 10.1 10*3/uL (ref 4.0–10.3)
lymph#: 8.4 10*3/uL — ABNORMAL HIGH (ref 0.9–3.3)

## 2012-07-25 LAB — LACTATE DEHYDROGENASE (CC13): LDH: 164 U/L (ref 125–245)

## 2012-07-25 MED ORDER — SODIUM CHLORIDE 0.9 % IV SOLN
250.0000 mg/m2 | Freq: Once | INTRAVENOUS | Status: AC
  Administered 2012-07-25: 560 mg via INTRAVENOUS
  Filled 2012-07-25: qty 28

## 2012-07-25 MED ORDER — HEPARIN SOD (PORK) LOCK FLUSH 100 UNIT/ML IV SOLN
500.0000 [IU] | Freq: Once | INTRAVENOUS | Status: AC | PRN
  Administered 2012-07-25: 500 [IU]
  Filled 2012-07-25: qty 5

## 2012-07-25 MED ORDER — SODIUM CHLORIDE 0.9 % IV SOLN
Freq: Once | INTRAVENOUS | Status: AC
  Administered 2012-07-25: 09:00:00 via INTRAVENOUS

## 2012-07-25 MED ORDER — ONDANSETRON 8 MG/50ML IVPB (CHCC)
8.0000 mg | Freq: Once | INTRAVENOUS | Status: AC
  Administered 2012-07-25: 8 mg via INTRAVENOUS

## 2012-07-25 MED ORDER — DIPHENHYDRAMINE HCL 25 MG PO CAPS
50.0000 mg | ORAL_CAPSULE | Freq: Once | ORAL | Status: AC
  Administered 2012-07-25: 50 mg via ORAL

## 2012-07-25 MED ORDER — SODIUM CHLORIDE 0.9 % IV SOLN
500.0000 mg/m2 | Freq: Once | INTRAVENOUS | Status: AC
  Administered 2012-07-25: 1100 mg via INTRAVENOUS
  Filled 2012-07-25: qty 110

## 2012-07-25 MED ORDER — SODIUM CHLORIDE 0.9 % IJ SOLN
10.0000 mL | INTRAMUSCULAR | Status: DC | PRN
  Administered 2012-07-25: 10 mL
  Filled 2012-07-25: qty 10

## 2012-07-25 MED ORDER — SODIUM CHLORIDE 0.9 % IV SOLN
25.0000 mg/m2 | Freq: Once | INTRAVENOUS | Status: AC
  Administered 2012-07-25: 57.5 mg via INTRAVENOUS
  Filled 2012-07-25: qty 2.3

## 2012-07-25 MED ORDER — ACETAMINOPHEN 325 MG PO TABS
650.0000 mg | ORAL_TABLET | Freq: Once | ORAL | Status: AC
  Administered 2012-07-25: 650 mg via ORAL

## 2012-07-25 NOTE — Progress Notes (Signed)
1116--Pt had almost completed his 30 minute rate at 300mg  and pt started to complain about being cold.  Pt was observed having rigors.  No SOB, no other complaints.  He states this happened last time but his wife states it is worse.  Rituxan stopped, NS line started wide open.  VSS.  Charge RN Amy Horton spoke to Dr Welton Flakes, he states to r/s pt on lowest rate and do not go above the rate he tolerated.    1130--Rituxan r/s at 1130 at 100mg , Will continue to monitor closely.  Symptoms completely resolved by 1145.  SLJ  1400--rate increased to 147ml/hr x 53cc.    1430--rate increased to 140 x 70ml for remainder of infusion.  Pt tolerated well.  No further complications.  SLJ

## 2012-07-25 NOTE — Patient Instructions (Signed)
[facility] Discharge Instructions for Patients Receiving Chemotherapy  Today you received the following chemotherapy agents fludara, cytoxan, rituxan  To help prevent nausea and vomiting after your treatment, we encourage you to take your nausea medication as needed   If you develop nausea and vomiting that is not controlled by your nausea medication, call the clinic.   BELOW ARE SYMPTOMS THAT SHOULD BE REPORTED IMMEDIATELY:  *FEVER GREATER THAN 100.5 F  *CHILLS WITH OR WITHOUT FEVER  NAUSEA AND VOMITING THAT IS NOT CONTROLLED WITH YOUR NAUSEA MEDICATION  *UNUSUAL SHORTNESS OF BREATH  *UNUSUAL BRUISING OR BLEEDING  TENDERNESS IN MOUTH AND THROAT WITH OR WITHOUT PRESENCE OF ULCERS  *URINARY PROBLEMS  *BOWEL PROBLEMS  UNUSUAL RASH Items with * indicate a potential emergency and should be followed up as soon as possible.  Feel free to call the clinic you have any questions or concerns. The clinic phone number is [phone].

## 2012-07-25 NOTE — Telephone Encounter (Signed)
, °

## 2012-07-25 NOTE — Patient Instructions (Addendum)
Proceed with cycle 2 of FCR today - Friday  I will see you back on 6/20

## 2012-07-25 NOTE — Progress Notes (Signed)
OFFICE PROGRESS NOTE  CC  [name] [address] [place] [address]  DIAGNOSIS: 62 year old gentleman with  #1 stage II CLL.now progressive  #2 polycythemia likely secondary now resolved.  PRIOR THERAPY:   #1. Begin chemotherapy today (06/20/12) consisting of FCR days 1 -3  #2 dental abscess requiring oral antibiotics  CURRENT THERAPY: cycle #2 of FCR days one through 3  INTERVAL HISTORY: Ryan Copeland 62 y.o. male returns for followup visit prior to his chemotherapy. Clinically he seems to be doing well. He still does have a little bit of what looks like a postnasal drip. But he denies any fevers chills or night sweats. He has no headaches no double vision no blurring of vision. He didn't receive blood transfusions on his last visit and since then he does feel much better. He denies any peripheral paresthesias. Remainder of the 10 point review of systems is negative.  MEDICAL HISTORY: Past Medical History  Diagnosis Date  . 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 Hyperlipidemia   . Tobacco abuse   . PVD (peripheral vascular disease)   . 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 Diabetes mellitus     ALLERGIES:  is allergic to codeine.  MEDICATIONS:  Current Outpatient Prescriptions  Medication Sig Dispense Refill  . acyclovir (ZOVIRAX) 400 MG tablet Take 1 tablet (400 mg total) by mouth daily.  30 tablet  5  . allopurinol (ZYLOPRIM) 300 MG tablet Take 1 tablet (300 mg total) by mouth daily.  30 tablet  6  . ALPRAZolam (XANAX) 0.25 MG tablet Take 1 po q 6 hours prn for anxiety  90 tablet  1  . amoxicillin-clavulanate (AUGMENTIN) 875-125 MG per tablet Take 1 tablet by mouth 2 (two) times daily.  14 tablet  0  . aspirin 325 MG tablet Take 325 mg by mouth daily.        . ciprofloxacin (CIPRO) 500 MG tablet Take 1 tablet (500 mg total) by mouth 2 (two) times daily.  14 tablet  6  . dexamethasone (DECADRON) 4 MG  tablet Take 2 tablets (8 mg total) by mouth 2 (two) times daily with a meal. Take daily starting the day after chemotherapy for 2 days. Take with food.  30 tablet  1  . diphenhydrAMINE (BENADRYL) 25 mg capsule Take 50 mg by mouth at bedtime as needed for itching or sleep.      Marland Kitchen esomeprazole (NEXIUM) 40 MG capsule Take 1 capsule (40 mg total) by mouth 2 (two) times daily.  180 capsule  3  . fluticasone (FLOVENT HFA) 110 MCG/ACT inhaler Inhale 1 puff into the lungs 2 (two) times daily.  1 Inhaler  12  . Ipratropium-Albuterol (COMBIVENT) 20-100 MCG/ACT AERS respimat Inhale 1 puff into the lungs every 6 (six) hours.  1 Inhaler  6  . lidocaine-prilocaine (EMLA) cream Apply topically as needed. Place cream on port site 1-2 hours prior to treatment.  30 g  2  . LORazepam (ATIVAN) 0.5 MG tablet Take 1 tablet (0.5 mg total) by mouth every 6 (six) hours as needed (Nausea or vomiting).  30 tablet  0  . metFORMIN (GLUCOPHAGE-XR) 500 MG 24 hr tablet Take 500 mg by mouth 2 (two) times daily.      . metoprolol succinate (TOPROL-XL) 50 MG 24 hr tablet Take 50 mg by mouth daily with breakfast. Take with or immediately following a meal.      . nitroGLYCERIN (NITROSTAT)  0.4 MG SL tablet Place 1 tablet (0.4 mg total) under the tongue every 5 (five) minutes as needed.  25 tablet  12  . ondansetron (ZOFRAN) 8 MG tablet Take 1 tablet (8 mg total) by mouth 2 (two) times daily. Take two times a day starting the day after chemo for 2 days. Then take two times a day as needed for nausea or vomiting.  30 tablet  1  . oxyCODONE (OXY IR/ROXICODONE) 5 MG immediate release tablet Take 1 tablet (5 mg total) by mouth every 4 (four) hours as needed for pain.  30 tablet  0  . prasugrel (EFFIENT) 10 MG TABS Take 10 mg by mouth daily with breakfast.      . pregabalin (LYRICA) 75 MG capsule Take 75-150 mg by mouth 4 (four) times daily. 1 cap three times daily and 2 caps at bedtime      . prochlorperazine (COMPAZINE) 10 MG tablet Take 1  tablet (10 mg total) by mouth every 6 (six) hours as needed (Nausea or vomiting).  30 tablet  1  . prochlorperazine (COMPAZINE) 25 MG suppository Place 1 suppository (25 mg total) rectally every 12 (twelve) hours as needed for nausea.  12 suppository  3  . sulfamethoxazole-trimethoprim (BACTRIM DS,SEPTRA DS) 800-160 MG per tablet Take 1 tablet by mouth 3 (three) times a week.  12 tablet  5  . tetrahydrozoline 0.05 % ophthalmic solution Place 1 drop into both eyes as needed. Red eyes      . topiramate (TOPAMAX) 25 MG capsule Take 25 mg by mouth 2 (two) times daily.      . varenicline (CHANTIX) 0.5 MG tablet Take 1 tablet (0.5 mg total) by mouth 2 (two) times daily.  360 tablet  0   No current facility-administered medications for this visit.    SURGICAL HISTORY:  Past Surgical History  Procedure Laterality Date  . Femoral stents    . Coronary stent placement  May 2011  . Cholecystectomy  2007    REVIEW OF SYSTEMS:   General: fatigue (-), night sweats (-), fever (-), pain (-) Lymph: palpable nodes (-) HEENT: vision changes (-), mucositis (-), gum bleeding (-), epistaxis (-) Cardiovascular: chest pain (-), palpitations (-) Pulmonary: shortness of breath (-), dyspnea on exertion (-), cough (-), hemoptysis (-) GI:  Early satiety (-), melena (-), dysphagia (-), nausea/vomiting (-), diarrhea (-) GU: dysuria (-), hematuria (-), incontinence (-) Musculoskeletal: joint swelling (-), joint pain (-), back pain (-) Neuro: weakness (-), numbness (-), headache (-), confusion (-) Skin: Rash (-), lesions (-), dryness (-) Psych: depression (-), suicidal/homicidal ideation (-), feeling of hopelessness (-)   PHYSICAL EXAMINATION:  BP 104/60  Pulse 80  Temp(Src) 97.7 F (36.5 C) (Oral)  Resp 20 General: Patient is a well appearing male in no acute distress HEENT: PERRLA, sclerae anicteric no conjunctival pallor, MMM Neck: supple, no palpable adenopathy Lungs: clear to auscultation bilaterally, no  wheezes, rhonchi, or rales Cardiovascular: regular rate rhythm, S1, S2, no murmurs, rubs or gallops Abdomen: Soft, non-tender, non-distended, normoactive bowel sounds, no HSM Extremities: warm and well perfused, no clubbing, cyanosis, or edema Skin: No rashes or lesions Neuro: Non-focal ECOG PERFORMANCE STATUS: 0 - Asymptomatic  LABORATORY DATA: Lab Results  Component Value Date   WBC 10.1 07/25/2012   HGB 10.7* 07/25/2012   HCT 31.3* 07/25/2012   MCV 102.2* 07/25/2012   PLT 59* 07/25/2012      Chemistry      Component Value Date/Time   NA 141 07/17/2012  1130   NA 134* 06/01/2012 1145   K 4.6 07/17/2012 1130   K 4.6 06/01/2012 1145   CL 110* 07/17/2012 1130   CL 103 06/01/2012 1145   CO2 24 07/17/2012 1130   CO2 24 06/01/2012 1145   BUN 13.2 07/17/2012 1130   BUN 15 06/01/2012 1145   CREATININE 1.0 07/17/2012 1130   CREATININE 0.96 06/01/2012 1145      Component Value Date/Time   CALCIUM 8.8 07/17/2012 1130   CALCIUM 9.5 06/01/2012 1145   ALKPHOS 109 07/17/2012 1130   ALKPHOS 65 08/22/2011 0904   AST 24 07/17/2012 1130   AST 19 08/22/2011 0904   ALT 55 07/17/2012 1130   ALT 27 08/22/2011 0904   BILITOT 0.68 07/17/2012 1130   BILITOT 0.5 08/22/2011 0904    FINAL DIAGNOSIS Diagnosis Bone Marrow, Aspirate,Biopsy, and Clot, right iliac bone - HYPERCELLULAR BONE MARROW WITH EXTENSIVE INVOLVEMENT BY CHRONIC LYMPHOCYTIC LEUKEMIA. PERIPHERAL BLOOD: - CHRONIC LYMPHOCYTIC LEUKEMIA. [name] MD Pathologist, Electronic Signature (Case signed 11/22/2011) GROSS AND MICROSCOPIC INFORMATION Specimen Clinical Information hx CLL [jl] Source Bone Marrow, Aspirate,Biopsy, and Clot, right iliac bone Microscopic LAB DATA: CBC performed on 11/18/11 shows: WBC 80.87 K/ul Neutrophils 5% HB 15.7 g/dl Lymphocytes 16% HCT 10.9 % Monocytes 1% MCV 96.0 fL Eosinophils 0% RDW 15.4 % Basophils 0% PLT 81 K/ul PERIPHERAL BLOOD SMEAR: The red blood cells display mild anisopoikilocytosis with minimal polychromasia. The  white blood cells are markedly increased in number with lymphocytosis. The lymphocytes primarily consist of small lymphoid cells with generally high nuclear cytoplasmic ratio, dense chromatin with block type clumping, and inconspicuous nucleoli. There are numerous smudge cells in the 1 of 3 FINAL for MURRELL, DOME [phone]) Microscopic(continued) background. A significant prolymphocytic component is not present. The platelets are decreased in number. BONE MARROW ASPIRATE: Erythroid precursors: Relatively decreased with progressive maturation. Granulocytic precursors: Relatively decreased with progressive maturation. Megakaryocytes: Scattered forms are seen displaying normal morphology. Lymphocytes/plasma cells: The lymphocytes are markedly increased in number representing 95% of all cells and consist of small lymphoid cells with high nuclear cytoplasmic ratio, dense chromatin and inconspicuous nucleoli. A significant prolymphocytic or large lymphoid cell component is not present. Significant plasma cell aggregates are not present. TOUCH PREPARATIONS: Predominance of small lymphoid cells. CLOT and BIOPSY: The sections show 90% cellularity with extensive infiltration of the medullary space by numerous aggregates, interstitial infiltrates and diffuse sheets of primarily small lymphoid cells displaying high nuclear cytoplasmic ratio, dense chromatin and small to inconspicuous nucleoli. Scattered small proliferation centers are seen. Myeloid hematopoiesis appears decreased in the background. Immunohistochemical stains for CD20, CD79a, CD3, CD5, CD10 and cyclin D1 were performed with appropriate controls. The lymphoid population is stains positively ffor CD79a (B cell marker) with patchy weaker positivity for CD20. There is patchy weak coexpression of CD5. No CD10 or cyclin D1 expression is identified. IRON STAIN: Iron stains are performed on a bone marrow aspirate smear and section of clot. The  controls stained appropriately. Storage Iron: Decreased. Ringed Sideroblasts: Absent. ADDITIONAL DATA / TESTING: Specimen was sent for cytogenetic analysis and a separate report will follow. Flow cytometric analysis of the lymphoid population ([id number]) shows a major B cell population expressing pan B cell antigens including CD20 and CD23 associated with coexpression of CD5. This population shows extremely dim/negative staining for surface immunoglobulin light chains. (BNS:gt, 11/21/11) Specimen Table Bone Marrow count performed on 500 cells shows: Blasts: 0% Myeloid 3% Promyelocyts: 0% Myelocytes: 0% Erythroid 2% Metamyelocyts: 1% Bands: 1% Lymphocytes: 95%  Neutrophils: 1% Eosinophils: 0% Plasma Cells: 0% Basophils: 0% Monocytes: 0% M:E ratio: 1.5 Gross Received in [facility] are tissue fragments which aggregate 0.4 x 0.4 x 0.1 cm. The specimen is submitted in toto. Received in a separate container in [facility] are two cores of bone measuring 2.2 and 2.8 cm in length and each 0.2 cm in diameter. The specimen is submitted in toto following decalcification. (GP:eps 11/18/11) 2 of   RADIOGRAPHIC STUDIES: CT CHEST  Findings: Bilateral axillary lymphadenopathy is slightly  progressed compared to prior. For example, index node in the  right axilla measures 15 mm short axis (image 14) compared to 9 mm  on prior. Adjacent rounded 20 mm lymph node (#8) compares to 13 mm  on prior. There are clustered small supraclavicular lymph nodes  which also appears slightly enlarged. Within the mediastinum small  paratracheal lymph nodes are similar. No pericardial fluid.  Esophagus is normal. Coronary artery stent is noted.  Review of the lung parenchyma demonstrates no new suspicious  pulmonary nodules.  IMPRESSION:  1. Mild continued progressive enlargement of axillary  lymphadenopathy.  2. Stable supraclavicular and mediastinal adenopathy.  CT ABDOMEN AND PELVIS  Findings: Retroperitoneal  lymphadenopathy is slightly progressed  compared to prior. For example conglomerate of nodes left of the  aorta measures 26 mm short axis compared to 22 mm on prior (image  71). Periportal lymph node measures 23 mm compared to 20 mm on  prior.  The spleen is massively enlarged with a calculated volume of 2362  ml compared to 1972 ml on prior.  Bilateral external iliac lymph nodes are increased in volume. Left  node measures 19mm short axis compared to 13 on prior. Inguinal  adenopathy is also increased.  The liver, liver is normal. Gallbladder is absent. The spleen,  adrenal glands, kidneys are normal.  The colon rectosigmoid colon are normal.  The bladder prostate gland normal. No pelvic fluid.  IMPRESSION:  1. Progressive increase in retroperitoneal, iliac, and inguinal  lymphadenopathy.  2. Interval increase in massive splenomegaly.     ASSESSMENT: 62 year old gentleman with  #1 CLL progressive Now receiving chemotherapy. His physical revealed decrease in his lymphadenopathy in the cervical and axillary area. Spleen is also improved. He will proceed with cycle #2 of FCR today.  #2 dental abscess resolved but still needs to be monitored very closely. He does have been oral surgeon that he has been seeing.   PLAN:  #1 proceed with cycle 2 of FCR.  #2 he will return in one week's time for followup and labs  All questions were answered. The patient knows to call the clinic with any problems, questions or concerns. We can certainly see the patient much sooner if necessary.  I spent 25 minutes counseling the patient face to face. The total time spent in the appointment was 30 minutes.  Drue Second, MD Medical/Oncology [facility] [phone] (beeper) [phone] (Office)

## 2012-07-26 ENCOUNTER — Ambulatory Visit (HOSPITAL_BASED_OUTPATIENT_CLINIC_OR_DEPARTMENT_OTHER): Payer: BC Managed Care – PPO

## 2012-07-26 VITALS — BP 127/62 | HR 68 | Temp 97.7°F | Resp 18

## 2012-07-26 DIAGNOSIS — C9110 Chronic lymphocytic leukemia of B-cell type not having achieved remission: Secondary | ICD-10-CM

## 2012-07-26 DIAGNOSIS — Z5111 Encounter for antineoplastic chemotherapy: Secondary | ICD-10-CM

## 2012-07-26 MED ORDER — SODIUM CHLORIDE 0.9 % IJ SOLN
10.0000 mL | INTRAMUSCULAR | Status: DC | PRN
  Administered 2012-07-26: 10 mL
  Filled 2012-07-26: qty 10

## 2012-07-26 MED ORDER — ONDANSETRON 8 MG/50ML IVPB (CHCC)
8.0000 mg | Freq: Once | INTRAVENOUS | Status: AC
  Administered 2012-07-26: 8 mg via INTRAVENOUS

## 2012-07-26 MED ORDER — HEPARIN SOD (PORK) LOCK FLUSH 100 UNIT/ML IV SOLN
500.0000 [IU] | Freq: Once | INTRAVENOUS | Status: AC | PRN
  Administered 2012-07-26: 500 [IU]
  Filled 2012-07-26: qty 5

## 2012-07-26 MED ORDER — SODIUM CHLORIDE 0.9 % IV SOLN
Freq: Once | INTRAVENOUS | Status: AC
  Administered 2012-07-26: 12:00:00 via INTRAVENOUS

## 2012-07-26 MED ORDER — SODIUM CHLORIDE 0.9 % IV SOLN
250.0000 mg/m2 | Freq: Once | INTRAVENOUS | Status: AC
  Administered 2012-07-26: 560 mg via INTRAVENOUS
  Filled 2012-07-26: qty 28

## 2012-07-26 MED ORDER — SODIUM CHLORIDE 0.9 % IV SOLN
25.0000 mg/m2 | Freq: Once | INTRAVENOUS | Status: AC
  Administered 2012-07-26: 57.5 mg via INTRAVENOUS
  Filled 2012-07-26: qty 2.3

## 2012-07-26 NOTE — Patient Instructions (Signed)
[facility] Discharge Instructions for Patients Receiving Chemotherapy  Today you received the following chemotherapy agents Fludara and Cytoxan.  To help prevent nausea and vomiting after your treatment, we encourage you to take your nausea medication.   If you develop nausea and vomiting that is not controlled by your nausea medication, call the clinic.   BELOW ARE SYMPTOMS THAT SHOULD BE REPORTED IMMEDIATELY:  *FEVER GREATER THAN 100.5 F  *CHILLS WITH OR WITHOUT FEVER  NAUSEA AND VOMITING THAT IS NOT CONTROLLED WITH YOUR NAUSEA MEDICATION  *UNUSUAL SHORTNESS OF BREATH  *UNUSUAL BRUISING OR BLEEDING  TENDERNESS IN MOUTH AND THROAT WITH OR WITHOUT PRESENCE OF ULCERS  *URINARY PROBLEMS  *BOWEL PROBLEMS  UNUSUAL RASH Items with * indicate a potential emergency and should be followed up as soon as possible.  Feel free to call the clinic you have any questions or concerns. The clinic phone number is [phone].    FLUDARABINE (floo DARE a been) is a chemotherapy drug. It interferes with the growth of cancer cells. It is usually used to treat chronic lymphocytic leukemia (CLL). This medicine may be used for other purposes; ask your health care provider or pharmacist if you have questions. What should I tell my health care provider before I take this medicine? They need to know if you have any of these conditions: -infection (especially virus infection such as chickenpox or cold sores) -kidney disease -low blood counts like low platelets, red blood cells, white blood cells -an unusual or allergic reaction to fludarabine, other chemotherapy, other medicines, foods, dyes, or preservatives -pregnant or trying to get pregnant -breast-feeding How should I use this medicine? This drug is given as an infusion into a vein. It is administered in a hospital or clinic by a specially trained health care professional. Talk to your pediatrician regarding the use of this  medicine in children. Special care may be needed. Overdosage: If you think you have taken too much of this medicine contact a poison control center or emergency room at once. NOTE: This medicine is only for you. Do not share this medicine with others. What if I miss a dose? It is important not to miss your dose. Call your doctor or health care professional if you are unable to keep an appointment. What may interact with this medicine? Do not take this medicine with any of the following medications: -pentostatin This medicine may also interact with the following medications: -medicines to increase blood counts like filgrastim, pegfilgrastim, sargramostim -vaccines Talk to your doctor or health care professional before taking any of these medicines: -acetaminophen -aspirin -ibuprofen -naproxen -ketoprofen This list may not describe all possible interactions. Give your health care provider a list of all the medicines, herbs, non-prescription drugs, or dietary supplements you use. Also tell them if you smoke, drink alcohol, or use illegal drugs. Some items may interact with your medicine. What should I watch for while using this medicine? This drug may make you feel generally unwell. This is not uncommon, as chemotherapy can affect healthy cells as well as cancer cells. Report any side effects. Continue your course of treatment even though you feel ill unless your doctor tells you to stop. Call your doctor or health care professional for advice if you get a fever, chills or sore throat, or other symptoms of a cold or flu. Do not treat yourself. This drug decreases your body's ability to fight infections. Try to avoid being around people who are sick. This medicine may increase your  risk to bruise or bleed. Call your doctor or health care professional if you notice any unusual bleeding. Be careful brushing and flossing your teeth or using a toothpick because you may get an infection or bleed more  easily. If you have any dental work done, tell your dentist you are receiving this medicine. Avoid taking products that contain aspirin, acetaminophen, ibuprofen, naproxen, or ketoprofen unless instructed by your doctor. These medicines may hide a fever. Do not become pregnant while taking this medicine. Women should inform their doctor if they wish to become pregnant or think they might be pregnant. There is a potential for serious side effects to an unborn child. Talk to your health care professional or pharmacist for more information. Do not breast-feed an infant while taking this medicine. Men should inform their doctors if they wish to father a child. This medicine may lower sperm counts. What side effects may I notice from receiving this medicine? Side effects that you should report to your doctor or health care professional as soon as possible: -allergic reactions like skin rash, itching or hives, swelling of the face, lips, or tongue -low blood counts - this medicine may decrease the number of white blood cells, red blood cells and platelets. You may be at increased risk for infections and bleeding. -signs of infection - fever or chills, cough, sore throat, pain or difficulty passing urine -signs of decreased platelets or bleeding - bruising, pinpoint red spots on the skin, black, tarry stools, nosebleeds -signs of decreased red blood cells - unusually weak or tired, fainting spells, lightheadedness -breathing problems -changes in hearing -changes in vision -confusion -dry cough -mouth sores -muscle weakness -pain, tingling, numbness in the hands or feet -swelling of the ankles, feet, hands -trouble passing urine or change in the amount of urine -yellowing of the eyes or skin Side effects that usually do not require medical attention (report to your doctor or health care professional if they continue or are bothersome): -constipation -diarrhea -hair loss -loss of appetite -nausea,  vomiting -trouble sleeping This list may not describe all possible side effects. Call your doctor for medical advice about side effects. You may report side effects to FDA at 1-800-FDA-1088. Where should I keep my medicine? This drug is given in a hospital or clinic and will not be stored at home. NOTE: This sheet is a summary. It may not cover all possible information. If you have questions about this medicine, talk to your doctor, pharmacist, or health care provider.  2012, [employer]. (06/06/2007 1:47:35 PM)   Cyclophosphamide injection What is this medicine? CYCLOPHOSPHAMIDE (sye kloe FOSS fa mide) is a chemotherapy drug. It slows the growth of cancer cells. This medicine is used to treat many types of cancer like lymphoma, myeloma, leukemia, breast cancer, and ovarian cancer, to name a few. It is also used to treat nephrotic syndrome in children. This medicine may be used for other purposes; ask your health care provider or pharmacist if you have questions. What should I tell my health care provider before I take this medicine? They need to know if you have any of these conditions: -blood disorders -history of other chemotherapy -history of radiation therapy -infection -kidney disease -liver disease -tumors in the bone marrow -an unusual or allergic reaction to cyclophosphamide, other chemotherapy, other medicines, foods, dyes, or preservatives -pregnant or trying to get pregnant -breast-feeding How should I use this medicine? This drug is usually given as an injection into a vein or muscle or by infusion  into a vein. It is administered in a hospital or clinic by a specially trained health care professional. Talk to your pediatrician regarding the use of this medicine in children. While this drug may be prescribed for selected conditions, precautions do apply. Overdosage: If you think you have taken too much of this medicine contact a poison control center or emergency  room at once. NOTE: This medicine is only for you. Do not share this medicine with others. What if I miss a dose? It is important not to miss your dose. Call your doctor or health care professional if you are unable to keep an appointment. What may interact with this medicine? Do not take this medicine with any of the following medications: -mibefradil -nalidixic acid This medicine may also interact with the following medications: -doxorubicin -etanercept -medicines to increase blood counts like filgrastim, pegfilgrastim, sargramostim -medicines that block muscle or nerve pain -St. John's Wort -phenobarbital -succinylcholine chloride -trastuzumab -vaccines Talk to your doctor or health care professional before taking any of these medicines: -acetaminophen -aspirin -ibuprofen -ketoprofen -naproxen This list may not describe all possible interactions. Give your health care provider a list of all the medicines, herbs, non-prescription drugs, or dietary supplements you use. Also tell them if you smoke, drink alcohol, or use illegal drugs. Some items may interact with your medicine. What should I watch for while using this medicine? Visit your doctor for checks on your progress. This drug may make you feel generally unwell. This is not uncommon, as chemotherapy can affect healthy cells as well as cancer cells. Report any side effects. Continue your course of treatment even though you feel ill unless your doctor tells you to stop. Drink water or other fluids as directed. Urinate often, even at night. In some cases, you may be given additional medicines to help with side effects. Follow all directions for their use. Call your doctor or health care professional for advice if you get a fever, chills or sore throat, or other symptoms of a cold or flu. Do not treat yourself. This drug decreases your body's ability to fight infections. Try to avoid being around people who are sick. This medicine  may increase your risk to bruise or bleed. Call your doctor or health care professional if you notice any unusual bleeding. Be careful brushing and flossing your teeth or using a toothpick because you may get an infection or bleed more easily. If you have any dental work done, tell your dentist you are receiving this medicine. Avoid taking products that contain aspirin, acetaminophen, ibuprofen, naproxen, or ketoprofen unless instructed by your doctor. These medicines may hide a fever. Do not become pregnant while taking this medicine. Women should inform their doctor if they wish to become pregnant or think they might be pregnant. There is a potential for serious side effects to an unborn child. Talk to your health care professional or pharmacist for more information. Do not breast-feed an infant while taking this medicine. Men should inform their doctor if they wish to father a child. This medicine may lower sperm counts. If you are going to have surgery, tell your doctor or health care professional that you have taken this medicine. What side effects may I notice from receiving this medicine? Side effects that you should report to your doctor or health care professional as soon as possible: -allergic reactions like skin rash, itching or hives, swelling of the face, lips, or tongue -low blood counts - this medicine may decrease the number of white  blood cells, red blood cells and platelets. You may be at increased risk for infections and bleeding. -signs of infection - fever or chills, cough, sore throat, pain or difficulty passing urine -signs of decreased platelets or bleeding - bruising, pinpoint red spots on the skin, black, tarry stools, blood in the urine -signs of decreased red blood cells - unusually weak or tired, fainting spells, lightheadedness -breathing problems -dark urine -mouth sores -pain, swelling, redness at site where injected -swelling of the ankles, feet, hands -trouble  passing urine or change in the amount of urine -weight gain -yellowing of the eyes or skin Side effects that usually do not require medical attention (report to your doctor or health care professional if they continue or are bothersome): -changes in nail or skin color -diarrhea -hair loss -loss of appetite -missed menstrual periods -nausea, vomiting -stomach pain This list may not describe all possible side effects. Call your doctor for medical advice about side effects. You may report side effects to FDA at 1-800-FDA-1088. Where should I keep my medicine? This drug is given in a hospital or clinic and will not be stored at home. NOTE: This sheet is a summary. It may not cover all possible information. If you have questions about this medicine, talk to your doctor, pharmacist, or health care provider.  2013, [employer]. (05/08/2007 2:32:25 PM)

## 2012-07-27 ENCOUNTER — Ambulatory Visit (HOSPITAL_BASED_OUTPATIENT_CLINIC_OR_DEPARTMENT_OTHER): Payer: BC Managed Care – PPO

## 2012-07-27 VITALS — BP 101/64 | HR 69 | Temp 97.9°F

## 2012-07-27 DIAGNOSIS — C9110 Chronic lymphocytic leukemia of B-cell type not having achieved remission: Secondary | ICD-10-CM

## 2012-07-27 DIAGNOSIS — Z5111 Encounter for antineoplastic chemotherapy: Secondary | ICD-10-CM

## 2012-07-27 MED ORDER — HEPARIN SOD (PORK) LOCK FLUSH 100 UNIT/ML IV SOLN
500.0000 [IU] | Freq: Once | INTRAVENOUS | Status: AC | PRN
  Administered 2012-07-27: 500 [IU]
  Filled 2012-07-27: qty 5

## 2012-07-27 MED ORDER — SODIUM CHLORIDE 0.9 % IV SOLN
Freq: Once | INTRAVENOUS | Status: AC
  Administered 2012-07-27: 14:00:00 via INTRAVENOUS

## 2012-07-27 MED ORDER — SODIUM CHLORIDE 0.9 % IV SOLN
25.0000 mg/m2 | Freq: Once | INTRAVENOUS | Status: AC
  Administered 2012-07-27: 57.5 mg via INTRAVENOUS
  Filled 2012-07-27: qty 2.3

## 2012-07-27 MED ORDER — SODIUM CHLORIDE 0.9 % IJ SOLN
10.0000 mL | INTRAMUSCULAR | Status: DC | PRN
  Administered 2012-07-27: 10 mL
  Filled 2012-07-27: qty 10

## 2012-07-27 MED ORDER — ONDANSETRON 8 MG/50ML IVPB (CHCC)
8.0000 mg | Freq: Once | INTRAVENOUS | Status: AC
  Administered 2012-07-27: 8 mg via INTRAVENOUS

## 2012-07-27 MED ORDER — SODIUM CHLORIDE 0.9 % IV SOLN
250.0000 mg/m2 | Freq: Once | INTRAVENOUS | Status: AC
  Administered 2012-07-27: 560 mg via INTRAVENOUS
  Filled 2012-07-27: qty 28

## 2012-07-27 NOTE — Patient Instructions (Signed)
[facility] Discharge Instructions for Patients Receiving Chemotherapy  Today you received the following chemotherapy agents Fludara/Cytoxan To help prevent nausea and vomiting after your treatment, we encourage you to take your nausea medication as prescribed.   If you develop nausea and vomiting that is not controlled by your nausea medication, call the clinic.   BELOW ARE SYMPTOMS THAT SHOULD BE REPORTED IMMEDIATELY:  *FEVER GREATER THAN 100.5 F  *CHILLS WITH OR WITHOUT FEVER  NAUSEA AND VOMITING THAT IS NOT CONTROLLED WITH YOUR NAUSEA MEDICATION  *UNUSUAL SHORTNESS OF BREATH  *UNUSUAL BRUISING OR BLEEDING  TENDERNESS IN MOUTH AND THROAT WITH OR WITHOUT PRESENCE OF ULCERS  *URINARY PROBLEMS  *BOWEL PROBLEMS  UNUSUAL RASH Items with * indicate a potential emergency and should be followed up as soon as possible.  Feel free to call the clinic you have any questions or concerns. The clinic phone number is [phone].    

## 2012-08-02 ENCOUNTER — Encounter (HOSPITAL_COMMUNITY): Payer: Self-pay | Admitting: Dentistry

## 2012-08-03 ENCOUNTER — Telehealth: Payer: Self-pay | Admitting: *Deleted

## 2012-08-03 ENCOUNTER — Encounter: Payer: Self-pay | Admitting: Oncology

## 2012-08-03 ENCOUNTER — Ambulatory Visit (HOSPITAL_BASED_OUTPATIENT_CLINIC_OR_DEPARTMENT_OTHER): Payer: BC Managed Care – PPO | Admitting: Oncology

## 2012-08-03 ENCOUNTER — Other Ambulatory Visit (HOSPITAL_BASED_OUTPATIENT_CLINIC_OR_DEPARTMENT_OTHER): Payer: BC Managed Care – PPO

## 2012-08-03 VITALS — BP 107/68 | HR 83 | Temp 98.3°F | Resp 20 | Ht 70.0 in | Wt 206.5 lb

## 2012-08-03 DIAGNOSIS — C9110 Chronic lymphocytic leukemia of B-cell type not having achieved remission: Secondary | ICD-10-CM

## 2012-08-03 LAB — COMPREHENSIVE METABOLIC PANEL (CC13)
ALT: 116 U/L — ABNORMAL HIGH (ref 0–55)
AST: 31 U/L (ref 5–34)
Albumin: 3.7 g/dL (ref 3.5–5.0)
CO2: 25 mEq/L (ref 22–29)
Calcium: 9.4 mg/dL (ref 8.4–10.4)
Chloride: 104 mEq/L (ref 98–107)
Creatinine: 0.9 mg/dL (ref 0.7–1.3)
Potassium: 4.3 mEq/L (ref 3.5–5.1)

## 2012-08-03 LAB — CBC WITH DIFFERENTIAL/PLATELET
Basophils Absolute: 0 10*3/uL (ref 0.0–0.1)
EOS%: 1.4 % (ref 0.0–7.0)
HCT: 29.1 % — ABNORMAL LOW (ref 38.4–49.9)
HGB: 10 g/dL — ABNORMAL LOW (ref 13.0–17.1)
MCH: 33.7 pg — ABNORMAL HIGH (ref 27.2–33.4)
MCV: 98 fL (ref 79.3–98.0)
MONO%: 2.8 % (ref 0.0–14.0)
NEUT%: 32 % — ABNORMAL LOW (ref 39.0–75.0)

## 2012-08-03 LAB — LACTATE DEHYDROGENASE (CC13): LDH: 155 U/L (ref 125–245)

## 2012-08-03 MED ORDER — AMOXICILLIN-POT CLAVULANATE 875-125 MG PO TABS: 1.0000 | ORAL_TABLET | Freq: Two times a day (BID) | ORAL | Status: DC

## 2012-08-03 NOTE — Progress Notes (Signed)
OFFICE PROGRESS NOTE  CC  Ryan Copeland [address] [place] [address]  DIAGNOSIS: 62 year old gentleman with  #1 stage II CLL.now progressive  #2 polycythemia likely secondary now resolved.  PRIOR THERAPY:   #1. Begin chemotherapy today (06/20/12) consisting of FCR days 1 -3  #2 dental abscess requiring oral antibiotics  CURRENT THERAPY: status postcycle #2 of FCR days one through 3  INTERVAL HISTORY: Ryan Copeland 62 y.o. male returns for followup visit  Clinically he seems to be doing well. He still does have a little bit of what looks like a postnasal drip. But he denies any fevers chills or night sweats. He has no headaches no double vision no blurring of vision. He didn't receive blood transfusions on his last visit and since then he does feel much better. He denies any peripheral paresthesias. Remainder of the 10 point review of systems is negative.  MEDICAL HISTORY: Past Medical History  Diagnosis Date  . 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 Hyperlipidemia   . Tobacco abuse   . PVD (peripheral vascular disease)   . 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 Diabetes mellitus     ALLERGIES:  is allergic to codeine.  MEDICATIONS:  Current Outpatient Prescriptions  Medication Sig Dispense Refill  . acyclovir (ZOVIRAX) 400 MG tablet Take 1 tablet (400 mg total) by mouth daily.  30 tablet  5  . allopurinol (ZYLOPRIM) 300 MG tablet Take 1 tablet (300 mg total) by mouth daily.  30 tablet  6  . aspirin 325 MG tablet Take 325 mg by mouth daily.        Marland Kitchen dexamethasone (DECADRON) 4 MG tablet Take 2 tablets (8 mg total) by mouth 2 (two) times daily with a meal. Take daily starting the day after chemotherapy for 2 days. Take with food.  30 tablet  1  . diphenhydrAMINE (BENADRYL) 25 mg capsule Take 50 mg by mouth at bedtime as needed for itching or sleep.      Marland Kitchen esomeprazole (NEXIUM) 40 MG capsule Take 1 capsule (40  mg total) by mouth 2 (two) times daily.  180 capsule  3  . fluticasone (FLOVENT HFA) 110 MCG/ACT inhaler Inhale 1 puff into the lungs 2 (two) times daily.  1 Inhaler  12  . Ipratropium-Albuterol (COMBIVENT) 20-100 MCG/ACT AERS respimat Inhale 1 puff into the lungs every 6 (six) hours.  1 Inhaler  6  . lidocaine-prilocaine (EMLA) cream Apply topically as needed. Place cream on port site 1-2 hours prior to treatment.  30 g  2  . metFORMIN (GLUCOPHAGE-XR) 500 MG 24 hr tablet Take 500 mg by mouth 2 (two) times daily.      . metoprolol succinate (TOPROL-XL) 50 MG 24 hr tablet Take 50 mg by mouth daily with breakfast. Take with or immediately following a meal.      . ondansetron (ZOFRAN) 8 MG tablet Take 1 tablet (8 mg total) by mouth 2 (two) times daily. Take two times a day starting the day after chemo for 2 days. Then take two times a day as needed for nausea or vomiting.  30 tablet  1  . prasugrel (EFFIENT) 10 MG TABS Take 10 mg by mouth daily with breakfast.      . pregabalin (LYRICA) 75 MG capsule Take 75-150 mg by mouth 4 (four) times daily. 1 cap three times daily and 2 caps at bedtime      . sulfamethoxazole-trimethoprim (BACTRIM  DS,SEPTRA DS) 800-160 MG per tablet Take 1 tablet by mouth 3 (three) times a week.  12 tablet  5  . tetrahydrozoline 0.05 % ophthalmic solution Place 1 drop into both eyes as needed. Red eyes      . topiramate (TOPAMAX) 25 MG capsule Take 25 mg by mouth 2 (two) times daily.      . varenicline (CHANTIX) 0.5 MG tablet Take 1 tablet (0.5 mg total) by mouth 2 (two) times daily.  360 tablet  0  . ALPRAZolam (XANAX) 0.25 MG tablet Take 1 po q 6 hours prn for anxiety  90 tablet  1  . amoxicillin-clavulanate (AUGMENTIN) 875-125 MG per tablet Take 1 tablet by mouth 2 (two) times daily.  14 tablet  0  . ciprofloxacin (CIPRO) 500 MG tablet Take 1 tablet (500 mg total) by mouth 2 (two) times daily.  14 tablet  6  . LORazepam (ATIVAN) 0.5 MG tablet Take 1 tablet (0.5 mg total) by  mouth every 6 (six) hours as needed (Nausea or vomiting).  30 tablet  0  . nitroGLYCERIN (NITROSTAT) 0.4 MG SL tablet Place 1 tablet (0.4 mg total) under the tongue every 5 (five) minutes as needed.  25 tablet  12  . oxyCODONE (OXY IR/ROXICODONE) 5 MG immediate release tablet Take 1 tablet (5 mg total) by mouth every 4 (four) hours as needed for pain.  30 tablet  0  . prochlorperazine (COMPAZINE) 10 MG tablet Take 1 tablet (10 mg total) by mouth every 6 (six) hours as needed (Nausea or vomiting).  30 tablet  1  . prochlorperazine (COMPAZINE) 25 MG suppository Place 1 suppository (25 mg total) rectally every 12 (twelve) hours as needed for nausea.  12 suppository  3   No current facility-administered medications for this visit.    SURGICAL HISTORY:  Past Surgical History  Procedure Laterality Date  . Femoral stents    . Coronary stent placement  May 2011  . Cholecystectomy  2007    REVIEW OF SYSTEMS:   General: fatigue (-), night sweats (-), fever (-), pain (-) Lymph: palpable nodes (-) HEENT: vision changes (-), mucositis (-), gum bleeding (-), epistaxis (-) Cardiovascular: chest pain (-), palpitations (-) Pulmonary: shortness of breath (-), dyspnea on exertion (-), cough (-), hemoptysis (-) GI:  Early satiety (-), melena (-), dysphagia (-), nausea/vomiting (-), diarrhea (-) GU: dysuria (-), hematuria (-), incontinence (-) Musculoskeletal: joint swelling (-), joint pain (-), back pain (-) Neuro: weakness (-), numbness (-), headache (-), confusion (-) Skin: Rash (-), lesions (-), dryness (-) Psych: depression (-), suicidal/homicidal ideation (-), feeling of hopelessness (-)   PHYSICAL EXAMINATION:  BP 107/68  Pulse 83  Temp(Src) 98.3 F (36.8 C) (Oral)  Resp 20  Ht 5\' 10"  (1.778 m)  Wt 206 lb 8 oz (93.668 kg)  BMI 29.63 kg/m2 General: Patient is a well appearing male in no acute distress HEENT: PERRLA, sclerae anicteric no conjunctival pallor, MMM Neck: supple, no palpable  adenopathy Lungs: clear to auscultation bilaterally, no wheezes, rhonchi, or rales Cardiovascular: regular rate rhythm, S1, S2, no murmurs, rubs or gallops Abdomen: Soft, non-tender, non-distended, normoactive bowel sounds, no HSM Extremities: warm and well perfused, no clubbing, cyanosis, or edema Skin: No rashes or lesions Neuro: Non-focal ECOG PERFORMANCE STATUS: 0 - Asymptomatic  LABORATORY DATA: Lab Results  Component Value Date   WBC 1.4* 08/03/2012   HGB 10.0* 08/03/2012   HCT 29.1* 08/03/2012   MCV 98.0 08/03/2012   PLT 58* 08/03/2012  Chemistry      Component Value Date/Time   NA 138 07/25/2012 0752   NA 134* 06/01/2012 1145   K 4.6 07/25/2012 0752   K 4.6 06/01/2012 1145   CL 107 07/25/2012 0752   CL 103 06/01/2012 1145   CO2 23 07/25/2012 0752   CO2 24 06/01/2012 1145   BUN 16.7 07/25/2012 0752   BUN 15 06/01/2012 1145   CREATININE 1.1 07/25/2012 0752   CREATININE 0.96 06/01/2012 1145      Component Value Date/Time   CALCIUM 8.7 07/25/2012 0752   CALCIUM 9.5 06/01/2012 1145   ALKPHOS 108 07/25/2012 0752   ALKPHOS 65 08/22/2011 0904   AST 37* 07/25/2012 0752   AST 19 08/22/2011 0904   ALT 82* 07/25/2012 0752   ALT 27 08/22/2011 0904   BILITOT 0.50 07/25/2012 0752   BILITOT 0.5 08/22/2011 0904    FINAL DIAGNOSIS Diagnosis Bone Marrow, Aspirate,Biopsy, and Clot, right iliac bone - HYPERCELLULAR BONE MARROW WITH EXTENSIVE INVOLVEMENT BY CHRONIC LYMPHOCYTIC LEUKEMIA. PERIPHERAL BLOOD: - CHRONIC LYMPHOCYTIC LEUKEMIA. [name] MD Pathologist, Electronic Signature (Case signed 11/22/2011) GROSS AND MICROSCOPIC INFORMATION Specimen Clinical Information hx CLL [jl] Source Bone Marrow, Aspirate,Biopsy, and Clot, right iliac bone Microscopic LAB DATA: CBC performed on 11/18/11 shows: WBC 80.87 K/ul Neutrophils 5% HB 15.7 g/dl Lymphocytes 16% HCT 10.9 % Monocytes 1% MCV 96.0 fL Eosinophils 0% RDW 15.4 % Basophils 0% PLT 81 K/ul PERIPHERAL BLOOD SMEAR: The red blood cells  display mild anisopoikilocytosis with minimal polychromasia. The white blood cells are markedly increased in number with lymphocytosis. The lymphocytes primarily consist of small lymphoid cells with generally high nuclear cytoplasmic ratio, dense chromatin with block type clumping, and inconspicuous nucleoli. There are numerous smudge cells in the 1 of 3 FINAL for Ryan Copeland, Ryan Copeland) Microscopic(continued) background. A significant prolymphocytic component is not present. The platelets are decreased in number. BONE MARROW ASPIRATE: Erythroid precursors: Relatively decreased with progressive maturation. Granulocytic precursors: Relatively decreased with progressive maturation. Megakaryocytes: Scattered forms are seen displaying normal morphology. Lymphocytes/plasma cells: The lymphocytes are markedly increased in number representing 95% of all cells and consist of small lymphoid cells with high nuclear cytoplasmic ratio, dense chromatin and inconspicuous nucleoli. A significant prolymphocytic or large lymphoid cell component is not present. Significant plasma cell aggregates are not present. TOUCH PREPARATIONS: Predominance of small lymphoid cells. CLOT and BIOPSY: The sections show 90% cellularity with extensive infiltration of the medullary space by numerous aggregates, interstitial infiltrates and diffuse sheets of primarily small lymphoid cells displaying high nuclear cytoplasmic ratio, dense chromatin and small to inconspicuous nucleoli. Scattered small proliferation centers are seen. Myeloid hematopoiesis appears decreased in the background. Immunohistochemical stains for CD20, CD79a, CD3, CD5, CD10 and cyclin D1 were performed with appropriate controls. The lymphoid population is stains positively ffor CD79a (B cell marker) with patchy weaker positivity for CD20. There is patchy weak coexpression of CD5. No CD10 or cyclin D1 expression is identified. IRON STAIN: Iron stains are  performed on a bone marrow aspirate smear and section of clot. The controls stained appropriately. Storage Iron: Decreased. Ringed Sideroblasts: Absent. ADDITIONAL DATA / TESTING: Specimen was sent for cytogenetic analysis and a separate report will follow. Flow cytometric analysis of the lymphoid population ([id number]) shows a major B cell population expressing pan B cell antigens including CD20 and CD23 associated with coexpression of CD5. This population shows extremely dim/negative staining for surface immunoglobulin light chains. (BNS:gt, 11/21/11) Specimen Table Bone Marrow count performed on 500 cells shows: Blasts: 0%  Myeloid 3% Promyelocyts: 0% Myelocytes: 0% Erythroid 2% Metamyelocyts: 1% Bands: 1% Lymphocytes: 95% Neutrophils: 1% Eosinophils: 0% Plasma Cells: 0% Basophils: 0% Monocytes: 0% M:E ratio: 1.5 Gross Received in [facility] are tissue fragments which aggregate 0.4 x 0.4 x 0.1 cm. The specimen is submitted in toto. Received in a separate container in [facility] are two cores of bone measuring 2.2 and 2.8 cm in length and each 0.2 cm in diameter. The specimen is submitted in toto following decalcification. (GP:eps 11/18/11) 2 of   RADIOGRAPHIC STUDIES: CT CHEST  Findings: Bilateral axillary lymphadenopathy is slightly  progressed compared to prior. For example, index node in the  right axilla measures 15 mm short axis (image 14) compared to 9 mm  on prior. Adjacent rounded 20 mm lymph node (#8) compares to 13 mm  on prior. There are clustered small supraclavicular lymph nodes  which also appears slightly enlarged. Within the mediastinum small  paratracheal lymph nodes are similar. No pericardial fluid.  Esophagus is normal. Coronary artery stent is noted.  Review of the lung parenchyma demonstrates no new suspicious  pulmonary nodules.  IMPRESSION:  1. Mild continued progressive enlargement of axillary  lymphadenopathy.  2. Stable supraclavicular and mediastinal  adenopathy.  CT ABDOMEN AND PELVIS  Findings: Retroperitoneal lymphadenopathy is slightly progressed  compared to prior. For example conglomerate of nodes left of the  aorta measures 26 mm short axis compared to 22 mm on prior (image  71). Periportal lymph node measures 23 mm compared to 20 mm on  prior.  The spleen is massively enlarged with a calculated volume of 2362  ml compared to 1972 ml on prior.  Bilateral external iliac lymph nodes are increased in volume. Left  node measures 19mm short axis compared to 13 on prior. Inguinal  adenopathy is also increased.  The liver, liver is normal. Gallbladder is absent. The spleen,  adrenal glands, kidneys are normal.  The colon rectosigmoid colon are normal.  The bladder prostate gland normal. No pelvic fluid.  IMPRESSION:  1. Progressive increase in retroperitoneal, iliac, and inguinal  lymphadenopathy.  2. Interval increase in massive splenomegaly.     ASSESSMENT: 62 year old gentleman with  #1 CLL progressive Now receiving chemotherapy. His physical revealed decrease in his lymphadenopathy in the cervical and axillary area. Spleen is also improved. He has now completed cycle 2 of his chemotherapy.   #2 dental abscess resolved but still needs to be monitored very closely. He does have been oral surgeon that he has been seeing.  #3 neutropenia due to chemotherapy   PLAN:  Doing well,   You are neutropenic and you are at risk for infections  I will see you back on 7/9 for follow up and start of 3 days of chemotherapy   Patient Neutropenia Instruction Sheet  Diagnosis: CLL    Treating Physician: Drue Second, MD  Treatment: 1. Type of chemotherapy: FCR 2. Date of last treatment: 6/11 - 6/13  Last Blood Counts: Lab Results  Component Value Date   WBC 1.4* 08/03/2012   HGB 10.0* 08/03/2012   HCT 29.1* 08/03/2012   MCV 98.0 08/03/2012   PLT 58* 08/03/2012   ANC 0.5     Prophylactic Antibiotics: Cipro 500 mg by mouth  twice a day Instructions: 1. Monitor temperature and call if fever  greater than 100.5, chills, shaking chills (rigors) 2. Call Physician on-call at [phone] 3. Give him/her symptoms and list of medications that you are taking and your last blood count.    All  questions were answered. The patient knows to call the clinic with any problems, questions or concerns. We can certainly see the patient much sooner if necessary.  I spent 25 minutes counseling the patient face to face. The total time spent in the appointment was 30 minutes.  Drue Second, MD Medical/Oncology [facility] [phone] (beeper) [phone] (Office)

## 2012-08-03 NOTE — Telephone Encounter (Signed)
Lm gv appt d/t for 09/26/12 and 10/24/12. Pt is aware...td

## 2012-08-03 NOTE — Telephone Encounter (Signed)
Per staff message and POF I have scheduled appts.  JMW  

## 2012-08-03 NOTE — Telephone Encounter (Signed)
appts made and printed. Pt is aware that kk is in [facility] all day on 09/26/12 and 10/24/12 and still wants to see her only. i emailed her to make her aware of this and printed the emailed and placed it in her chair. Once she gives me a date i will schedule the appts w/labs. Pt is aware...td

## 2012-08-03 NOTE — Patient Instructions (Addendum)
Doing well,   You are neutropenic and you are at risk for infections  I will see you back on 7/9 for follow up and start of 3 days of chemotherapy   Patient Neutropenia Instruction Sheet  Diagnosis: CLL    Treating Physician: Drue Second, MD  Treatment: 1. Type of chemotherapy: FCR 2. Date of last treatment: 6/11 - 6/13  Last Blood Counts: Lab Results  Component Value Date   WBC 1.4* 08/03/2012   HGB 10.0* 08/03/2012   HCT 29.1* 08/03/2012   MCV 98.0 08/03/2012   PLT 58* 08/03/2012   ANC 0.5     Prophylactic Antibiotics: Cipro 500 mg by mouth twice a day Instructions: 1. Monitor temperature and call if fever  greater than 100.5, chills, shaking chills (rigors) 2. Call Physician on-call at [phone] 3. Give him/her symptoms and list of medications that you are taking and your last blood count.

## 2012-08-05 NOTE — Progress Notes (Signed)
OFFICE PROGRESS NOTE  CC  Ryan Copeland [address] [place] [address]  DIAGNOSIS: 62 year old gentleman with  #1 stage II CLL.now progressive  #2 polycythemia likely secondary now resolved.  PRIOR THERAPY:   #1. Begin chemotherapy today (06/20/12) consisting of FCR days 1 -3  CURRENT THERAPY: Status post FCR days 1 - 3(5/7 through 5/9)  INTERVAL HISTORY: Ryan Copeland 62 y.o. male returns for followup regarding his tooth abscess. He has been on Augmentin which he tolerated well. His to see if it is significantly improved swelling has gone down. He was seen by Dr. Valentino Hue who has referred him to oral surgeon for the possibility of having this tooth removed. Patient himself feels much better he has not had any fevers chills or night sweats no nausea or vomiting no myalgias and arthralgias. Remainder of the 10 point review of systems is negative.  MEDICAL HISTORY: Past Medical History  Diagnosis Date  . 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 Hyperlipidemia   . Tobacco abuse   . PVD (peripheral vascular disease)   . 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 Diabetes mellitus     ALLERGIES:  is allergic to codeine.  MEDICATIONS:  Current Outpatient Prescriptions  Medication Sig Dispense Refill  . acyclovir (ZOVIRAX) 400 MG tablet Take 1 tablet (400 mg total) by mouth daily.  30 tablet  5  . allopurinol (ZYLOPRIM) 300 MG tablet Take 1 tablet (300 mg total) by mouth daily.  30 tablet  6  . aspirin 325 MG tablet Take 325 mg by mouth daily.        . diphenhydrAMINE (BENADRYL) 25 mg capsule Take 50 mg by mouth at bedtime as needed for itching or sleep.      Marland Kitchen esomeprazole (NEXIUM) 40 MG capsule Take 1 capsule (40 mg total) by mouth 2 (two) times daily.  180 capsule  3  . lidocaine-prilocaine (EMLA) cream Apply topically as needed. Place cream on port site 1-2 hours prior to treatment.  30 g  2  . metFORMIN (GLUCOPHAGE-XR)  500 MG 24 hr tablet Take 500 mg by mouth 2 (two) times daily.      . metoprolol succinate (TOPROL-XL) 50 MG 24 hr tablet Take 50 mg by mouth daily with breakfast. Take with or immediately following a meal.      . pregabalin (LYRICA) 75 MG capsule Take 75-150 mg by mouth 4 (four) times daily. 1 cap three times daily and 2 caps at bedtime      . sulfamethoxazole-trimethoprim (BACTRIM DS,SEPTRA DS) 800-160 MG per tablet Take 1 tablet by mouth 3 (three) times a week.  12 tablet  5  . tetrahydrozoline 0.05 % ophthalmic solution Place 1 drop into both eyes as needed. Red eyes      . ALPRAZolam (XANAX) 0.25 MG tablet Take 1 po q 6 hours prn for anxiety  90 tablet  1  . amoxicillin-clavulanate (AUGMENTIN) 875-125 MG per tablet Take 1 tablet by mouth 2 (two) times daily.  14 tablet  0  . ciprofloxacin (CIPRO) 500 MG tablet Take 1 tablet (500 mg total) by mouth 2 (two) times daily.  14 tablet  6  . dexamethasone (DECADRON) 4 MG tablet Take 2 tablets (8 mg total) by mouth 2 (two) times daily with a meal. Take daily starting the day after chemotherapy for 2 days. Take with food.  30 tablet  1  . fluticasone (FLOVENT HFA) 110 MCG/ACT inhaler  Inhale 1 puff into the lungs 2 (two) times daily.  1 Inhaler  12  . Ipratropium-Albuterol (COMBIVENT) 20-100 MCG/ACT AERS respimat Inhale 1 puff into the lungs every 6 (six) hours.  1 Inhaler  6  . LORazepam (ATIVAN) 0.5 MG tablet Take 1 tablet (0.5 mg total) by mouth every 6 (six) hours as needed (Nausea or vomiting).  30 tablet  0  . nitroGLYCERIN (NITROSTAT) 0.4 MG SL tablet Place 1 tablet (0.4 mg total) under the tongue every 5 (five) minutes as needed.  25 tablet  12  . ondansetron (ZOFRAN) 8 MG tablet Take 1 tablet (8 mg total) by mouth 2 (two) times daily. Take two times a day starting the day after chemo for 2 days. Then take two times a day as needed for nausea or vomiting.  30 tablet  1  . oxyCODONE (OXY IR/ROXICODONE) 5 MG immediate release tablet Take 1 tablet (5 mg  total) by mouth every 4 (four) hours as needed for pain.  30 tablet  0  . prasugrel (EFFIENT) 10 MG TABS Take 10 mg by mouth daily with breakfast.      . prochlorperazine (COMPAZINE) 10 MG tablet Take 1 tablet (10 mg total) by mouth every 6 (six) hours as needed (Nausea or vomiting).  30 tablet  1  . prochlorperazine (COMPAZINE) 25 MG suppository Place 1 suppository (25 mg total) rectally every 12 (twelve) hours as needed for nausea.  12 suppository  3  . topiramate (TOPAMAX) 25 MG capsule Take 25 mg by mouth 2 (two) times daily.      . varenicline (CHANTIX) 0.5 MG tablet Take 1 tablet (0.5 mg total) by mouth 2 (two) times daily.  360 tablet  0   No current facility-administered medications for this visit.    SURGICAL HISTORY:  Past Surgical History  Procedure Laterality Date  . Femoral stents    . Coronary stent placement  May 2011  . Cholecystectomy  2007    REVIEW OF SYSTEMS:   General: fatigue (-), night sweats (-), fever (-), pain (-) Lymph: palpable nodes (-) HEENT: vision changes (-), mucositis (-), gum bleeding (-), epistaxis (-) Cardiovascular: chest pain (-), palpitations (-) Pulmonary: shortness of breath (-), dyspnea on exertion (-), cough (-), hemoptysis (-) GI:  Early satiety (-), melena (-), dysphagia (-), nausea/vomiting (-), diarrhea (-) GU: dysuria (-), hematuria (-), incontinence (-) Musculoskeletal: joint swelling (-), joint pain (-), back pain (-) Neuro: weakness (-), numbness (-), headache (-), confusion (-) Skin: Rash (-), lesions (-), dryness (-) Psych: depression (-), suicidal/homicidal ideation (-), feeling of hopelessness (-)   PHYSICAL EXAMINATION:  BP 102/64  Pulse 81  Temp(Src) 98 F (36.7 C) (Oral)  Resp 20  Ht 6' (1.829 m)  Wt 209 lb 14.4 oz (95.21 kg)  BMI 28.46 kg/m2 General: Patient is a well appearing male in no acute distress HEENT: PERRLA, sclerae anicteric no conjunctival pallor, MMM Neck: supple, no palpable adenopathy Lungs: clear  to auscultation bilaterally, no wheezes, rhonchi, or rales Cardiovascular: regular rate rhythm, S1, S2, no murmurs, rubs or gallops Abdomen: Soft, non-tender, non-distended, normoactive bowel sounds, no HSM Extremities: warm and well perfused, no clubbing, cyanosis, or edema Skin: No rashes or lesions Neuro: Non-focal ECOG PERFORMANCE STATUS: 0 - Asymptomatic  LABORATORY DATA: Lab Results  Component Value Date   WBC 1.4* 08/03/2012   HGB 10.0* 08/03/2012   HCT 29.1* 08/03/2012   MCV 98.0 08/03/2012   PLT 58* 08/03/2012      Chemistry  Component Value Date/Time   NA 139 08/03/2012 1229   NA 134* 06/01/2012 1145   K 4.3 08/03/2012 1229   K 4.6 06/01/2012 1145   CL 104 08/03/2012 1229   CL 103 06/01/2012 1145   CO2 25 08/03/2012 1229   CO2 24 06/01/2012 1145   BUN 15.9 08/03/2012 1229   BUN 15 06/01/2012 1145   CREATININE 0.9 08/03/2012 1229   CREATININE 0.96 06/01/2012 1145      Component Value Date/Time   CALCIUM 9.4 08/03/2012 1229   CALCIUM 9.5 06/01/2012 1145   ALKPHOS 101 08/03/2012 1229   ALKPHOS 65 08/22/2011 0904   AST 31 08/03/2012 1229   AST 19 08/22/2011 0904   ALT 116* 08/03/2012 1229   ALT 27 08/22/2011 0904   BILITOT 1.10 08/03/2012 1229   BILITOT 0.5 08/22/2011 0904    FINAL DIAGNOSIS Diagnosis Bone Marrow, Aspirate,Biopsy, and Clot, right iliac bone - HYPERCELLULAR BONE MARROW WITH EXTENSIVE INVOLVEMENT BY CHRONIC LYMPHOCYTIC LEUKEMIA. PERIPHERAL BLOOD: - CHRONIC LYMPHOCYTIC LEUKEMIA. [name] MD Pathologist, Electronic Signature (Case signed 11/22/2011) GROSS AND MICROSCOPIC INFORMATION Specimen Clinical Information hx CLL [jl] Source Bone Marrow, Aspirate,Biopsy, and Clot, right iliac bone Microscopic LAB DATA: CBC performed on 11/18/11 shows: WBC 80.87 K/ul Neutrophils 5% HB 15.7 g/dl Lymphocytes 16% HCT 10.9 % Monocytes 1% MCV 96.0 fL Eosinophils 0% RDW 15.4 % Basophils 0% PLT 81 K/ul PERIPHERAL BLOOD SMEAR: The red blood cells display mild  anisopoikilocytosis with minimal polychromasia. The white blood cells are markedly increased in number with lymphocytosis. The lymphocytes primarily consist of small lymphoid cells with generally high nuclear cytoplasmic ratio, dense chromatin with block type clumping, and inconspicuous nucleoli. There are numerous smudge cells in the 1 of 3 FINAL for ADRION, MENZ [phone]) Microscopic(continued) background. A significant prolymphocytic component is not present. The platelets are decreased in number. BONE MARROW ASPIRATE: Erythroid precursors: Relatively decreased with progressive maturation. Granulocytic precursors: Relatively decreased with progressive maturation. Megakaryocytes: Scattered forms are seen displaying normal morphology. Lymphocytes/plasma cells: The lymphocytes are markedly increased in number representing 95% of all cells and consist of small lymphoid cells with high nuclear cytoplasmic ratio, dense chromatin and inconspicuous nucleoli. A significant prolymphocytic or large lymphoid cell component is not present. Significant plasma cell aggregates are not present. TOUCH PREPARATIONS: Predominance of small lymphoid cells. CLOT and BIOPSY: The sections show 90% cellularity with extensive infiltration of the medullary space by numerous aggregates, interstitial infiltrates and diffuse sheets of primarily small lymphoid cells displaying high nuclear cytoplasmic ratio, dense chromatin and small to inconspicuous nucleoli. Scattered small proliferation centers are seen. Myeloid hematopoiesis appears decreased in the background. Immunohistochemical stains for CD20, CD79a, CD3, CD5, CD10 and cyclin D1 were performed with appropriate controls. The lymphoid population is stains positively ffor CD79a (B cell marker) with patchy weaker positivity for CD20. There is patchy weak coexpression of CD5. No CD10 or cyclin D1 expression is identified. IRON STAIN: Iron stains are performed on a  bone marrow aspirate smear and section of clot. The controls stained appropriately. Storage Iron: Decreased. Ringed Sideroblasts: Absent. ADDITIONAL DATA / TESTING: Specimen was sent for cytogenetic analysis and a separate report will follow. Flow cytometric analysis of the lymphoid population ([id number]) shows a major B cell population expressing pan B cell antigens including CD20 and CD23 associated with coexpression of CD5. This population shows extremely dim/negative staining for surface immunoglobulin light chains. (BNS:gt, 11/21/11) Specimen Table Bone Marrow count performed on 500 cells shows: Blasts: 0% Myeloid 3% Promyelocyts: 0% Myelocytes: 0%  Erythroid 2% Metamyelocyts: 1% Bands: 1% Lymphocytes: 95% Neutrophils: 1% Eosinophils: 0% Plasma Cells: 0% Basophils: 0% Monocytes: 0% M:E ratio: 1.5 Gross Received in [facility] are tissue fragments which aggregate 0.4 x 0.4 x 0.1 cm. The specimen is submitted in toto. Received in a separate container in [facility] are two cores of bone measuring 2.2 and 2.8 cm in length and each 0.2 cm in diameter. The specimen is submitted in toto following decalcification. (GP:eps 11/18/11) 2 of   RADIOGRAPHIC STUDIES: CT CHEST  Findings: Bilateral axillary lymphadenopathy is slightly  progressed compared to prior. For example, index node in the  right axilla measures 15 mm short axis (image 14) compared to 9 mm  on prior. Adjacent rounded 20 mm lymph node (#8) compares to 13 mm  on prior. There are clustered small supraclavicular lymph nodes  which also appears slightly enlarged. Within the mediastinum small  paratracheal lymph nodes are similar. No pericardial fluid.  Esophagus is normal. Coronary artery stent is noted.  Review of the lung parenchyma demonstrates no new suspicious  pulmonary nodules.  IMPRESSION:  1. Mild continued progressive enlargement of axillary  lymphadenopathy.  2. Stable supraclavicular and mediastinal adenopathy.  CT  ABDOMEN AND PELVIS  Findings: Retroperitoneal lymphadenopathy is slightly progressed  compared to prior. For example conglomerate of nodes left of the  aorta measures 26 mm short axis compared to 22 mm on prior (image  71). Periportal lymph node measures 23 mm compared to 20 mm on  prior.  The spleen is massively enlarged with a calculated volume of 2362  ml compared to 1972 ml on prior.  Bilateral external iliac lymph nodes are increased in volume. Left  node measures 19mm short axis compared to 13 on prior. Inguinal  adenopathy is also increased.  The liver, liver is normal. Gallbladder is absent. The spleen,  adrenal glands, kidneys are normal.  The colon rectosigmoid colon are normal.  The bladder prostate gland normal. No pelvic fluid.  IMPRESSION:  1. Progressive increase in retroperitoneal, iliac, and inguinal  lymphadenopathy.  2. Interval increase in massive splenomegaly.     ASSESSMENT: 62 year old gentleman with  #1 CLL progressive now to begin today patient is now status post cycle 1 of FCR given on 06/22/2012  #2 CT scans do reveal some progressive lymphadenopathy. Also he does have massive splenomegaly which is concerning. We discussed symptoms of splenic rupture. He knows to call me with any problems.  #3 We discussed FCR that is fludarabine cyclophosphamide and Rituxan. Risks and benefits and side effects of these were discussed. We also discussed logistics of getting a Port-A-Cath placed chemotherapy teaching class.he was also recommended to begin allopurinol Bactrim acyclovir her and we will also check hepatitis B. And C. Panel   #4 dental abscess: I have called a dental office here at [facility] long they will call him with an appointment. In the meantime I have started him on Augmentin twice a day.  #5 pain due to dental abscess: he is on Augmentin and the dental pain and abscess has improved  #6 neutropenia Resolved.   PLAN:  #1 dental abscess has improved  significantly he has finished all abx  #2 we will followup with dental medicine.  #3 he will return in June for cycle 2 of his chemotherapy for CLL.  All questions were answered. The patient knows to call the clinic with any problems, questions or concerns. We can certainly see the patient much sooner if necessary.  I spent 15 minutes counseling the  patient face to face. The total time spent in the appointment was 30 minutes.  Drue Second, MD Medical/Oncology [facility] [phone] (beeper) [phone] (Office)

## 2012-08-05 NOTE — Progress Notes (Signed)
OFFICE PROGRESS NOTE  CC  Ryan Copeland [address] [place] [address]  DIAGNOSIS: 62 year old gentleman with  #1 stage II CLL.now progressive  #2 polycythemia likely secondary now resolved.  PRIOR THERAPY:   #1. Begin chemotherapy today (06/20/12) consisting of FCR days 1 -3  CURRENT THERAPY: Status post FCR days 1 - 3(5/7 through 5/9)  INTERVAL HISTORY: Ryan Copeland 62 y.o. male returns for followup regarding his tooth abscess. He has been on Augmentin which he tolerated well. His to see if it is significantly improved swelling has gone down. He was seen by Dr. Valentino Hue who has referred him to oral surgeon for the possibility of having this tooth removed. Patient himself feels much better he has not had any fevers chills or night sweats no nausea or vomiting no myalgias and arthralgias. Remainder of the 10 point review of systems is negative.  MEDICAL HISTORY: Past Medical History  Diagnosis Date  . 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 Hyperlipidemia   . Tobacco abuse   . PVD (peripheral vascular disease)   . 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 Diabetes mellitus     ALLERGIES:  is allergic to codeine.  MEDICATIONS:  Current Outpatient Prescriptions  Medication Sig Dispense Refill  . acyclovir (ZOVIRAX) 400 MG tablet Take 1 tablet (400 mg total) by mouth daily.  30 tablet  5  . allopurinol (ZYLOPRIM) 300 MG tablet Take 1 tablet (300 mg total) by mouth daily.  30 tablet  6  . ALPRAZolam (XANAX) 0.25 MG tablet Take 1 po q 6 hours prn for anxiety  90 tablet  1  . aspirin 325 MG tablet Take 325 mg by mouth daily.        . diphenhydrAMINE (BENADRYL) 25 mg capsule Take 50 mg by mouth at bedtime as needed for itching or sleep.      Marland Kitchen esomeprazole (NEXIUM) 40 MG capsule Take 1 capsule (40 mg total) by mouth 2 (two) times daily.  180 capsule  3  . metFORMIN (GLUCOPHAGE-XR) 500 MG 24 hr tablet Take 500 mg by mouth  2 (two) times daily.      . metoprolol succinate (TOPROL-XL) 50 MG 24 hr tablet Take 50 mg by mouth daily with breakfast. Take with or immediately following a meal.      . oxyCODONE (OXY IR/ROXICODONE) 5 MG immediate release tablet Take 1 tablet (5 mg total) by mouth every 4 (four) hours as needed for pain.  30 tablet  0  . pregabalin (LYRICA) 75 MG capsule Take 75-150 mg by mouth 4 (four) times daily. 1 cap three times daily and 2 caps at bedtime      . sulfamethoxazole-trimethoprim (BACTRIM DS,SEPTRA DS) 800-160 MG per tablet Take 1 tablet by mouth 3 (three) times a week.  12 tablet  5  . tetrahydrozoline 0.05 % ophthalmic solution Place 1 drop into both eyes as needed. Red eyes      . varenicline (CHANTIX) 0.5 MG tablet Take 1 tablet (0.5 mg total) by mouth 2 (two) times daily.  360 tablet  0  . amoxicillin-clavulanate (AUGMENTIN) 875-125 MG per tablet Take 1 tablet by mouth 2 (two) times daily.  14 tablet  0  . ciprofloxacin (CIPRO) 500 MG tablet Take 1 tablet (500 mg total) by mouth 2 (two) times daily.  14 tablet  6  . dexamethasone (DECADRON) 4 MG tablet Take 2 tablets (8 mg total) by mouth 2 (two)  times daily with a meal. Take daily starting the day after chemotherapy for 2 days. Take with food.  30 tablet  1  . fluticasone (FLOVENT HFA) 110 MCG/ACT inhaler Inhale 1 puff into the lungs 2 (two) times daily.  1 Inhaler  12  . Ipratropium-Albuterol (COMBIVENT) 20-100 MCG/ACT AERS respimat Inhale 1 puff into the lungs every 6 (six) hours.  1 Inhaler  6  . lidocaine-prilocaine (EMLA) cream Apply topically as needed. Place cream on port site 1-2 hours prior to treatment.  30 g  2  . LORazepam (ATIVAN) 0.5 MG tablet Take 1 tablet (0.5 mg total) by mouth every 6 (six) hours as needed (Nausea or vomiting).  30 tablet  0  . nitroGLYCERIN (NITROSTAT) 0.4 MG SL tablet Place 1 tablet (0.4 mg total) under the tongue every 5 (five) minutes as needed.  25 tablet  12  . ondansetron (ZOFRAN) 8 MG tablet Take 1  tablet (8 mg total) by mouth 2 (two) times daily. Take two times a day starting the day after chemo for 2 days. Then take two times a day as needed for nausea or vomiting.  30 tablet  1  . prasugrel (EFFIENT) 10 MG TABS Take 10 mg by mouth daily with breakfast.      . prochlorperazine (COMPAZINE) 10 MG tablet Take 1 tablet (10 mg total) by mouth every 6 (six) hours as needed (Nausea or vomiting).  30 tablet  1  . prochlorperazine (COMPAZINE) 25 MG suppository Place 1 suppository (25 mg total) rectally every 12 (twelve) hours as needed for nausea.  12 suppository  3  . topiramate (TOPAMAX) 25 MG capsule Take 25 mg by mouth 2 (two) times daily.       No current facility-administered medications for this visit.    SURGICAL HISTORY:  Past Surgical History  Procedure Laterality Date  . Femoral stents    . Coronary stent placement  May 2011  . Cholecystectomy  2007    REVIEW OF SYSTEMS:   General: fatigue (-), night sweats (-), fever (-), pain (-) Lymph: palpable nodes (-) HEENT: vision changes (-), mucositis (-), gum bleeding (-), epistaxis (-) Cardiovascular: chest pain (-), palpitations (-) Pulmonary: shortness of breath (-), dyspnea on exertion (-), cough (-), hemoptysis (-) GI:  Early satiety (-), melena (-), dysphagia (-), nausea/vomiting (-), diarrhea (-) GU: dysuria (-), hematuria (-), incontinence (-) Musculoskeletal: joint swelling (-), joint pain (-), back pain (-) Neuro: weakness (-), numbness (-), headache (-), confusion (-) Skin: Rash (-), lesions (-), dryness (-) Psych: depression (-), suicidal/homicidal ideation (-), feeling of hopelessness (-)   PHYSICAL EXAMINATION:  BP 120/72  Pulse 98  Temp(Src) 98.1 F (36.7 C) (Oral)  Resp 20  Ht 6' (1.829 m)  Wt 212 lb 9.6 oz (96.435 kg)  BMI 28.83 kg/m2 General: Patient is a well appearing male in no acute distress HEENT: PERRLA, sclerae anicteric no conjunctival pallor, MMM Neck: supple, no palpable adenopathy Lungs:  clear to auscultation bilaterally, no wheezes, rhonchi, or rales Cardiovascular: regular rate rhythm, S1, S2, no murmurs, rubs or gallops Abdomen: Soft, non-tender, non-distended, normoactive bowel sounds, no HSM Extremities: warm and well perfused, no clubbing, cyanosis, or edema Skin: No rashes or lesions Neuro: Non-focal ECOG PERFORMANCE STATUS: 0 - Asymptomatic  LABORATORY DATA: Lab Results  Component Value Date   WBC 1.4* 08/03/2012   HGB 10.0* 08/03/2012   HCT 29.1* 08/03/2012   MCV 98.0 08/03/2012   PLT 58* 08/03/2012      Chemistry  Component Value Date/Time   NA 139 08/03/2012 1229   NA 134* 06/01/2012 1145   K 4.3 08/03/2012 1229   K 4.6 06/01/2012 1145   CL 104 08/03/2012 1229   CL 103 06/01/2012 1145   CO2 25 08/03/2012 1229   CO2 24 06/01/2012 1145   BUN 15.9 08/03/2012 1229   BUN 15 06/01/2012 1145   CREATININE 0.9 08/03/2012 1229   CREATININE 0.96 06/01/2012 1145      Component Value Date/Time   CALCIUM 9.4 08/03/2012 1229   CALCIUM 9.5 06/01/2012 1145   ALKPHOS 101 08/03/2012 1229   ALKPHOS 65 08/22/2011 0904   AST 31 08/03/2012 1229   AST 19 08/22/2011 0904   ALT 116* 08/03/2012 1229   ALT 27 08/22/2011 0904   BILITOT 1.10 08/03/2012 1229   BILITOT 0.5 08/22/2011 0904    FINAL DIAGNOSIS Diagnosis Bone Marrow, Aspirate,Biopsy, and Clot, right iliac bone - HYPERCELLULAR BONE MARROW WITH EXTENSIVE INVOLVEMENT BY CHRONIC LYMPHOCYTIC LEUKEMIA. PERIPHERAL BLOOD: - CHRONIC LYMPHOCYTIC LEUKEMIA. [name] MD Pathologist, Electronic Signature (Case signed 11/22/2011) GROSS AND MICROSCOPIC INFORMATION Specimen Clinical Information hx CLL [jl] Source Bone Marrow, Aspirate,Biopsy, and Clot, right iliac bone Microscopic LAB DATA: CBC performed on 11/18/11 shows: WBC 80.87 K/ul Neutrophils 5% HB 15.7 g/dl Lymphocytes 96% HCT 04.5 % Monocytes 1% MCV 96.0 fL Eosinophils 0% RDW 15.4 % Basophils 0% PLT 81 K/ul PERIPHERAL BLOOD SMEAR: The red blood cells display mild  anisopoikilocytosis with minimal polychromasia. The white blood cells are markedly increased in number with lymphocytosis. The lymphocytes primarily consist of small lymphoid cells with generally high nuclear cytoplasmic ratio, dense chromatin with block type clumping, and inconspicuous nucleoli. There are numerous smudge cells in the 1 of 3 FINAL for Ryan Copeland, Ryan Copeland [phone]) Microscopic(continued) background. A significant prolymphocytic component is not present. The platelets are decreased in number. BONE MARROW ASPIRATE: Erythroid precursors: Relatively decreased with progressive maturation. Granulocytic precursors: Relatively decreased with progressive maturation. Megakaryocytes: Scattered forms are seen displaying normal morphology. Lymphocytes/plasma cells: The lymphocytes are markedly increased in number representing 95% of all cells and consist of small lymphoid cells with high nuclear cytoplasmic ratio, dense chromatin and inconspicuous nucleoli. A significant prolymphocytic or large lymphoid cell component is not present. Significant plasma cell aggregates are not present. TOUCH PREPARATIONS: Predominance of small lymphoid cells. CLOT and BIOPSY: The sections show 90% cellularity with extensive infiltration of the medullary space by numerous aggregates, interstitial infiltrates and diffuse sheets of primarily small lymphoid cells displaying high nuclear cytoplasmic ratio, dense chromatin and small to inconspicuous nucleoli. Scattered small proliferation centers are seen. Myeloid hematopoiesis appears decreased in the background. Immunohistochemical stains for CD20, CD79a, CD3, CD5, CD10 and cyclin D1 were performed with appropriate controls. The lymphoid population is stains positively ffor CD79a (B cell marker) with patchy weaker positivity for CD20. There is patchy weak coexpression of CD5. No CD10 or cyclin D1 expression is identified. IRON STAIN: Iron stains are performed on a  bone marrow aspirate smear and section of clot. The controls stained appropriately. Storage Iron: Decreased. Ringed Sideroblasts: Absent. ADDITIONAL DATA / TESTING: Specimen was sent for cytogenetic analysis and a separate report will follow. Flow cytometric analysis of the lymphoid population ([id number]) shows a major B cell population expressing pan B cell antigens including CD20 and CD23 associated with coexpression of CD5. This population shows extremely dim/negative staining for surface immunoglobulin light chains. (BNS:gt, 11/21/11) Specimen Table Bone Marrow count performed on 500 cells shows: Blasts: 0% Myeloid 3% Promyelocyts: 0% Myelocytes: 0%  Erythroid 2% Metamyelocyts: 1% Bands: 1% Lymphocytes: 95% Neutrophils: 1% Eosinophils: 0% Plasma Cells: 0% Basophils: 0% Monocytes: 0% M:E ratio: 1.5 Gross Received in [facility] are tissue fragments which aggregate 0.4 x 0.4 x 0.1 cm. The specimen is submitted in toto. Received in a separate container in [facility] are two cores of bone measuring 2.2 and 2.8 cm in length and each 0.2 cm in diameter. The specimen is submitted in toto following decalcification. (GP:eps 11/18/11) 2 of   RADIOGRAPHIC STUDIES: CT CHEST  Findings: Bilateral axillary lymphadenopathy is slightly  progressed compared to prior. For example, index node in the  right axilla measures 15 mm short axis (image 14) compared to 9 mm  on prior. Adjacent rounded 20 mm lymph node (#8) compares to 13 mm  on prior. There are clustered small supraclavicular lymph nodes  which also appears slightly enlarged. Within the mediastinum small  paratracheal lymph nodes are similar. No pericardial fluid.  Esophagus is normal. Coronary artery stent is noted.  Review of the lung parenchyma demonstrates no new suspicious  pulmonary nodules.  IMPRESSION:  1. Mild continued progressive enlargement of axillary  lymphadenopathy.  2. Stable supraclavicular and mediastinal adenopathy.  CT  ABDOMEN AND PELVIS  Findings: Retroperitoneal lymphadenopathy is slightly progressed  compared to prior. For example conglomerate of nodes left of the  aorta measures 26 mm short axis compared to 22 mm on prior (image  71). Periportal lymph node measures 23 mm compared to 20 mm on  prior.  The spleen is massively enlarged with a calculated volume of 2362  ml compared to 1972 ml on prior.  Bilateral external iliac lymph nodes are increased in volume. Left  node measures 19mm short axis compared to 13 on prior. Inguinal  adenopathy is also increased.  The liver, liver is normal. Gallbladder is absent. The spleen,  adrenal glands, kidneys are normal.  The colon rectosigmoid colon are normal.  The bladder prostate gland normal. No pelvic fluid.  IMPRESSION:  1. Progressive increase in retroperitoneal, iliac, and inguinal  lymphadenopathy.  2. Interval increase in massive splenomegaly.     ASSESSMENT: 61 year old gentleman with  #1 CLL progressive now to begin today patient is now status post cycle 1 of FCR given on 06/22/2012  #2 CT scans do reveal some progressive lymphadenopathy. Also he does have massive splenomegaly which is concerning. We discussed symptoms of splenic rupture. He knows to call me with any problems.  #3 We discussed FCR that is fludarabine cyclophosphamide and Rituxan. Risks and benefits and side effects of these were discussed. We also discussed logistics of getting a Port-A-Cath placed chemotherapy teaching class.he was also recommended to begin allopurinol Bactrim acyclovir her and we will also check hepatitis B. And C. Panel   #4 dental abscess: I have called a dental office here at [facility] they will call him with an appointment. In the meantime I have started him on Augmentin twice a day.  #5 pain due to dental abscess: he is on Augmentin and the dental pain and abscess has improved  #6 neutropenia Resolved.   PLAN:  #1 dental abscess has improved  significantly he will finish up his antibiotics.  #2 we will followup with dental medicine.  #3 he will return in June for cycle 2 of his chemotherapy for CLL.  All questions were answered. The patient knows to call the clinic with any problems, questions or concerns. We can certainly see the patient much sooner if necessary.  I spent 15 minutes counseling  the patient face to face. The total time spent in the appointment was 30 minutes.  Drue Second, MD Medical/Oncology [facility] [phone] (beeper) [phone] (Office)

## 2012-08-10 NOTE — Progress Notes (Signed)
OFFICE PROGRESS NOTE  CC  Ryan Copeland [address] [place] [address]  DIAGNOSIS: 62 year old gentleman with  #1 stage II CLL.now progressive  #2 polycythemia likely secondary now resolved.  PRIOR THERAPY:   #1. Begin chemotherapy today (06/20/12) consisting of FCR days 1 -3  CURRENT THERAPY: Status post FCR days 1 - 3(5/7 through 5/9)  INTERVAL HISTORY: Ryan Copeland 62 y.o. male returns for followup. Clinically he seems to be doing well his dental abscess is significantly improved. He denies any fevers chills night sweats headaches shortness of breath chest pains palpitations no pain. He has no bleeding or bruising. His platelets are 54,000. He is cautioned about bruising and bleeding. He will be due for cycle 2 of his chemotherapy on 07/25/2012. Remainder of the 10 point review of systems is negative. MEDICAL HISTORY: Past Medical History  Diagnosis Date  . 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 Hyperlipidemia   . Tobacco abuse   . PVD (peripheral vascular disease)   . 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 Diabetes mellitus     ALLERGIES:  is allergic to codeine.  MEDICATIONS:  Current Outpatient Prescriptions  Medication Sig Dispense Refill  . acyclovir (ZOVIRAX) 400 MG tablet Take 1 tablet (400 mg total) by mouth daily.  30 tablet  5  . allopurinol (ZYLOPRIM) 300 MG tablet Take 1 tablet (300 mg total) by mouth daily.  30 tablet  6  . ALPRAZolam (XANAX) 0.25 MG tablet Take 1 po q 6 hours prn for anxiety  90 tablet  1  . aspirin 325 MG tablet Take 325 mg by mouth daily.        . ciprofloxacin (CIPRO) 500 MG tablet Take 1 tablet (500 mg total) by mouth 2 (two) times daily.  14 tablet  6  . dexamethasone (DECADRON) 4 MG tablet Take 2 tablets (8 mg total) by mouth 2 (two) times daily with a meal. Take daily starting the day after chemotherapy for 2 days. Take with food.  30 tablet  1  . diphenhydrAMINE  (BENADRYL) 25 mg capsule Take 50 mg by mouth at bedtime as needed for itching or sleep.      Marland Kitchen esomeprazole (NEXIUM) 40 MG capsule Take 1 capsule (40 mg total) by mouth 2 (two) times daily.  180 capsule  3  . fluticasone (FLOVENT HFA) 110 MCG/ACT inhaler Inhale 1 puff into the lungs 2 (two) times daily.  1 Inhaler  12  . Ipratropium-Albuterol (COMBIVENT) 20-100 MCG/ACT AERS respimat Inhale 1 puff into the lungs every 6 (six) hours.  1 Inhaler  6  . lidocaine-prilocaine (EMLA) cream Apply topically as needed. Place cream on port site 1-2 hours prior to treatment.  30 g  2  . metFORMIN (GLUCOPHAGE-XR) 500 MG 24 hr tablet Take 500 mg by mouth 2 (two) times daily.      . metoprolol succinate (TOPROL-XL) 50 MG 24 hr tablet Take 50 mg by mouth daily with breakfast. Take with or immediately following a meal.      . prasugrel (EFFIENT) 10 MG TABS Take 10 mg by mouth daily with breakfast.      . pregabalin (LYRICA) 75 MG capsule Take 75-150 mg by mouth 4 (four) times daily. 1 cap three times daily and 2 caps at bedtime      . sulfamethoxazole-trimethoprim (BACTRIM DS,SEPTRA DS) 800-160 MG per tablet Take 1 tablet by mouth 3 (three) times a week.  12 tablet  5  . tetrahydrozoline 0.05 % ophthalmic solution Place 1 drop into both eyes as needed. Red eyes      . topiramate (TOPAMAX) 25 MG capsule Take 25 mg by mouth 2 (two) times daily.      . varenicline (CHANTIX) 0.5 MG tablet Take 1 tablet (0.5 mg total) by mouth 2 (two) times daily.  360 tablet  0  . amoxicillin-clavulanate (AUGMENTIN) 875-125 MG per tablet Take 1 tablet by mouth 2 (two) times daily.  14 tablet  0  . LORazepam (ATIVAN) 0.5 MG tablet Take 1 tablet (0.5 mg total) by mouth every 6 (six) hours as needed (Nausea or vomiting).  30 tablet  0  . nitroGLYCERIN (NITROSTAT) 0.4 MG SL tablet Place 1 tablet (0.4 mg total) under the tongue every 5 (five) minutes as needed.  25 tablet  12  . ondansetron (ZOFRAN) 8 MG tablet Take 1 tablet (8 mg total) by  mouth 2 (two) times daily. Take two times a day starting the day after chemo for 2 days. Then take two times a day as needed for nausea or vomiting.  30 tablet  1  . oxyCODONE (OXY IR/ROXICODONE) 5 MG immediate release tablet Take 1 tablet (5 mg total) by mouth every 4 (four) hours as needed for pain.  30 tablet  0  . prochlorperazine (COMPAZINE) 10 MG tablet Take 1 tablet (10 mg total) by mouth every 6 (six) hours as needed (Nausea or vomiting).  30 tablet  1  . prochlorperazine (COMPAZINE) 25 MG suppository Place 1 suppository (25 mg total) rectally every 12 (twelve) hours as needed for nausea.  12 suppository  3   No current facility-administered medications for this visit.    SURGICAL HISTORY:  Past Surgical History  Procedure Laterality Date  . Femoral stents    . Coronary stent placement  May 2011  . Cholecystectomy  2007    REVIEW OF SYSTEMS:   General: fatigue (-), night sweats (-), fever (-), pain (-) Lymph: palpable nodes (-) HEENT: vision changes (-), mucositis (-), gum bleeding (-), epistaxis (-) Cardiovascular: chest pain (-), palpitations (-) Pulmonary: shortness of breath (-), dyspnea on exertion (-), cough (-), hemoptysis (-) GI:  Early satiety (-), melena (-), dysphagia (-), nausea/vomiting (-), diarrhea (-) GU: dysuria (-), hematuria (-), incontinence (-) Musculoskeletal: joint swelling (-), joint pain (-), back pain (-) Neuro: weakness (-), numbness (-), headache (-), confusion (-) Skin: Rash (-), lesions (-), dryness (-) Psych: depression (-), suicidal/homicidal ideation (-), feeling of hopelessness (-)   PHYSICAL EXAMINATION:  BP 108/64  Pulse 78  Temp(Src) 97.9 F (36.6 C) (Oral)  Resp 20  Ht 5\' 10"  (1.778 m)  Wt 206 lb 14.4 oz (93.849 kg)  BMI 29.69 kg/m2 General: Patient is a well appearing male in no acute distress HEENT: PERRLA, sclerae anicteric no conjunctival pallor, MMM Neck: supple, no palpable adenopathy Lungs: clear to auscultation  bilaterally, no wheezes, rhonchi, or rales Cardiovascular: regular rate rhythm, S1, S2, no murmurs, rubs or gallops Abdomen: Soft, non-tender, non-distended, normoactive bowel sounds, no HSM Extremities: warm and well perfused, no clubbing, cyanosis, or edema Skin: No rashes or lesions Neuro: Non-focal ECOG PERFORMANCE STATUS: 0 - Asymptomatic  LABORATORY DATA: Lab Results  Component Value Date   WBC 1.4* 08/03/2012   HGB 10.0* 08/03/2012   HCT 29.1* 08/03/2012   MCV 98.0 08/03/2012   PLT 58* 08/03/2012      Chemistry      Component Value Date/Time   NA 139 08/03/2012  1229   NA 134* 06/01/2012 1145   K 4.3 08/03/2012 1229   K 4.6 06/01/2012 1145   CL 104 08/03/2012 1229   CL 103 06/01/2012 1145   CO2 25 08/03/2012 1229   CO2 24 06/01/2012 1145   BUN 15.9 08/03/2012 1229   BUN 15 06/01/2012 1145   CREATININE 0.9 08/03/2012 1229   CREATININE 0.96 06/01/2012 1145      Component Value Date/Time   CALCIUM 9.4 08/03/2012 1229   CALCIUM 9.5 06/01/2012 1145   ALKPHOS 101 08/03/2012 1229   ALKPHOS 65 08/22/2011 0904   AST 31 08/03/2012 1229   AST 19 08/22/2011 0904   ALT 116* 08/03/2012 1229   ALT 27 08/22/2011 0904   BILITOT 1.10 08/03/2012 1229   BILITOT 0.5 08/22/2011 0904    FINAL DIAGNOSIS Diagnosis Bone Marrow, Aspirate,Biopsy, and Clot, right iliac bone - HYPERCELLULAR BONE MARROW WITH EXTENSIVE INVOLVEMENT BY CHRONIC LYMPHOCYTIC LEUKEMIA. PERIPHERAL BLOOD: - CHRONIC LYMPHOCYTIC LEUKEMIA. [name] MD Pathologist, Electronic Signature (Case signed 11/22/2011) GROSS AND MICROSCOPIC INFORMATION Specimen Clinical Information hx CLL [jl] Source Bone Marrow, Aspirate,Biopsy, and Clot, right iliac bone Microscopic LAB DATA: CBC performed on 11/18/11 shows: WBC 80.87 K/ul Neutrophils 5% HB 15.7 g/dl Lymphocytes 30% HCT 86.5 % Monocytes 1% MCV 96.0 fL Eosinophils 0% RDW 15.4 % Basophils 0% PLT 81 K/ul PERIPHERAL BLOOD SMEAR: The red blood cells display mild anisopoikilocytosis with  minimal polychromasia. The white blood cells are markedly increased in number with lymphocytosis. The lymphocytes primarily consist of small lymphoid cells with generally high nuclear cytoplasmic ratio, dense chromatin with block type clumping, and inconspicuous nucleoli. There are numerous smudge cells in the 1 of 3 FINAL for JAHVIER, ALDEA [phone]) Microscopic(continued) background. A significant prolymphocytic component is not present. The platelets are decreased in number. BONE MARROW ASPIRATE: Erythroid precursors: Relatively decreased with progressive maturation. Granulocytic precursors: Relatively decreased with progressive maturation. Megakaryocytes: Scattered forms are seen displaying normal morphology. Lymphocytes/plasma cells: The lymphocytes are markedly increased in number representing 95% of all cells and consist of small lymphoid cells with high nuclear cytoplasmic ratio, dense chromatin and inconspicuous nucleoli. A significant prolymphocytic or large lymphoid cell component is not present. Significant plasma cell aggregates are not present. TOUCH PREPARATIONS: Predominance of small lymphoid cells. CLOT and BIOPSY: The sections show 90% cellularity with extensive infiltration of the medullary space by numerous aggregates, interstitial infiltrates and diffuse sheets of primarily small lymphoid cells displaying high nuclear cytoplasmic ratio, dense chromatin and small to inconspicuous nucleoli. Scattered small proliferation centers are seen. Myeloid hematopoiesis appears decreased in the background. Immunohistochemical stains for CD20, CD79a, CD3, CD5, CD10 and cyclin D1 were performed with appropriate controls. The lymphoid population is stains positively ffor CD79a (B cell marker) with patchy weaker positivity for CD20. There is patchy weak coexpression of CD5. No CD10 or cyclin D1 expression is identified. IRON STAIN: Iron stains are performed on a bone marrow aspirate  smear and section of clot. The controls stained appropriately. Storage Iron: Decreased. Ringed Sideroblasts: Absent. ADDITIONAL DATA / TESTING: Specimen was sent for cytogenetic analysis and a separate report will follow. Flow cytometric analysis of the lymphoid population ([id number]) shows a major B cell population expressing pan B cell antigens including CD20 and CD23 associated with coexpression of CD5. This population shows extremely dim/negative staining for surface immunoglobulin light chains. (BNS:gt, 11/21/11) Specimen Table Bone Marrow count performed on 500 cells shows: Blasts: 0% Myeloid 3% Promyelocyts: 0% Myelocytes: 0% Erythroid 2% Metamyelocyts: 1% Bands: 1% Lymphocytes: 95%  Neutrophils: 1% Eosinophils: 0% Plasma Cells: 0% Basophils: 0% Monocytes: 0% M:E ratio: 1.5 Gross Received in [facility] are tissue fragments which aggregate 0.4 x 0.4 x 0.1 cm. The specimen is submitted in toto. Received in a separate container in [facility] are two cores of bone measuring 2.2 and 2.8 cm in length and each 0.2 cm in diameter. The specimen is submitted in toto following decalcification. (GP:eps 11/18/11) 2 of   RADIOGRAPHIC STUDIES: CT CHEST  Findings: Bilateral axillary lymphadenopathy is slightly  progressed compared to prior. For example, index node in the  right axilla measures 15 mm short axis (image 14) compared to 9 mm  on prior. Adjacent rounded 20 mm lymph node (#8) compares to 13 mm  on prior. There are clustered small supraclavicular lymph nodes  which also appears slightly enlarged. Within the mediastinum small  paratracheal lymph nodes are similar. No pericardial fluid.  Esophagus is normal. Coronary artery stent is noted.  Review of the lung parenchyma demonstrates no new suspicious  pulmonary nodules.  IMPRESSION:  1. Mild continued progressive enlargement of axillary  lymphadenopathy.  2. Stable supraclavicular and mediastinal adenopathy.  CT ABDOMEN AND PELVIS   Findings: Retroperitoneal lymphadenopathy is slightly progressed  compared to prior. For example conglomerate of nodes left of the  aorta measures 26 mm short axis compared to 22 mm on prior (image  71). Periportal lymph node measures 23 mm compared to 20 mm on  prior.  The spleen is massively enlarged with a calculated volume of 2362  ml compared to 1972 ml on prior.  Bilateral external iliac lymph nodes are increased in volume. Left  node measures 19mm short axis compared to 13 on prior. Inguinal  adenopathy is also increased.  The liver, liver is normal. Gallbladder is absent. The spleen,  adrenal glands, kidneys are normal.  The colon rectosigmoid colon are normal.  The bladder prostate gland normal. No pelvic fluid.  IMPRESSION:  1. Progressive increase in retroperitoneal, iliac, and inguinal  lymphadenopathy.  2. Interval increase in massive splenomegaly.     ASSESSMENT: 62 year old gentleman with  #1 CLL progressive now to begin today patient is now status post cycle 1 of FCR given on 06/22/2012  #2 CT scans do reveal some progressive lymphadenopathy. Also he does have massive splenomegaly which is concerning. We discussed symptoms of splenic rupture. He knows to call me with any problems.  #3 We discussed FCR that is fludarabine cyclophosphamide and Rituxan. Risks and benefits and side effects of these were discussed. We also discussed logistics of getting a Port-A-Cath placed chemotherapy teaching class.he was also recommended to begin allopurinol Bactrim acyclovir her and we will also check hepatitis B. And C. Panel   #4 dental abscess: I have called a dental office here at [facility] long they will call him with an appointment. In the meantime I have started him on Augmentin twice a day.  #5 pain due to dental abscess: he is on Augmentin and the dental pain and abscess has improved  #6 neutropenia Resolved.   PLAN:  #1 overall patient is doing well.  #2 he will return  in one week's time for M.D. visit lab and cycle #2 of FCR.  All questions were answered. The patient knows to call the clinic with any problems, questions or concerns. We can certainly see the patient much sooner if necessary.  I spent 15 minutes counseling the patient face to face. The total time spent in the appointment was 30 minutes.  Drue Second, MD  Medical/Oncology [facility] [phone] (beeper) [phone] (Office)

## 2012-08-20 ENCOUNTER — Encounter: Payer: Self-pay | Admitting: Nurse Practitioner

## 2012-08-20 ENCOUNTER — Ambulatory Visit (INDEPENDENT_AMBULATORY_CARE_PROVIDER_SITE_OTHER): Payer: BC Managed Care – PPO | Admitting: Nurse Practitioner

## 2012-08-20 VITALS — BP 111/60 | HR 99 | Ht 74.5 in | Wt 199.0 lb

## 2012-08-20 DIAGNOSIS — G609 Hereditary and idiopathic neuropathy, unspecified: Secondary | ICD-10-CM

## 2012-08-20 DIAGNOSIS — G619 Inflammatory polyneuropathy, unspecified: Secondary | ICD-10-CM

## 2012-08-20 DIAGNOSIS — G622 Polyneuropathy due to other toxic agents: Secondary | ICD-10-CM

## 2012-08-20 MED ORDER — TOPIRAMATE 25 MG PO CPSP: 25.0000 mg | ORAL_CAPSULE | Freq: Two times a day (BID) | ORAL | Status: DC

## 2012-08-20 MED ORDER — PREGABALIN 75 MG PO CAPS: ORAL_CAPSULE | ORAL | Status: DC

## 2012-08-20 MED ORDER — CYANOCOBALAMIN 1000 MCG/ML IJ SOLN: 1000.0000 ug | INTRAMUSCULAR | Status: DC

## 2012-08-20 NOTE — Progress Notes (Signed)
HPI:  [name], 62 year old white male returns for followup. He has a history of polyneuropathy which has been long-standing. He was unable to get a renewal on his transdermal cream which has been helpful. He continues to have some pain, numbness and paresthesias in both feet left worse than right. Sensation is worse in great toes. His feet are cold he also has a burning sensation. Gabapentin was not beneficial for his symptoms. He was found to have a low B6 level and an extremely low vitamin B12 level at 121.  He has been on vitamin B12 injections which his wife gives monthly. He is currently employed working  in [place], [place]. He has had no falls, he is sleeping well. He is currently taking Lyrica 75 mg 3 times a day and two 50 mg at night. He was placed on Transdermal cream last year, with good results.  He has failed Cymbalta. He is also on   Topamax. He states his walking and balance are unchanged. He has not fallen. He continues to work full time. He has CLL and is just starting his chemotherapy.    ROS:  Ringing in the ears, cough, numbness  Physical Exam General: well developed, well nourished, seated, in no evident distress Head: head normocephalic and atraumatic. Oropharynx benign Neck: supple with no carotid  bruits Cardiovascular: regular rate and rhythm, no murmurs Skin  Ecchymoses both hands and forearms.   Neurologic Exam Mental Status: Awake and fully alert. Oriented to place and time. Follows all commands. Speech and language normal.   Cranial Nerves:  Pupils equal, briskly reactive to light. Extraocular movements full without nystagmus. Visual fields full to confrontation. Hearing intact and symmetric to finger snap. Facial sensation intact. Face, tongue, palate move normally and symmetrically. Neck flexion and extension normal.  Motor: Normal bulk and tone. Normal strength in all tested extremity muscles.No focal weakness Sensory.: Diminished pinprick to ankles bilaterally, decreased  vibratory to toes, position sense normal .  Coordination: Rapid alternating movements normal in all extremities. Finger-to-nose and heel-to-shin performed accurately bilaterally. No dysmetria Gait and Station: Arises from chair without difficulty. Stance is normal. Gait demonstrates normal stride length and balance . Able to heel, toe and tandem walk without difficulty.  Reflexes: Diminished and symmetric. Toes downgoing.     ASSESSMENT: Polyneuropathy since 2009 Vitamin B12 deficiency with monthly injections Has failed gabapentin and Cymbalta in the past       PLAN: Continue B12 injections monthly will renew Continue Topamax 25 twice a day will refill Continue Lyrica as directed will refill Reorder transdermal therapeutic cream with information for patient for refills Followup in 6 months  Nilda Riggs, GNP-BC APRN

## 2012-08-20 NOTE — Patient Instructions (Addendum)
Continue B12 injections monthly Continue Topamax 25 twice a day Continue Lyrica as directed will refill Reorder transdermal therapeutic cream with information for patient for refills Followup in 6 months

## 2012-08-22 ENCOUNTER — Other Ambulatory Visit (HOSPITAL_BASED_OUTPATIENT_CLINIC_OR_DEPARTMENT_OTHER): Payer: BC Managed Care – PPO | Admitting: Lab

## 2012-08-22 ENCOUNTER — Ambulatory Visit (HOSPITAL_BASED_OUTPATIENT_CLINIC_OR_DEPARTMENT_OTHER): Payer: BC Managed Care – PPO

## 2012-08-22 ENCOUNTER — Ambulatory Visit (HOSPITAL_BASED_OUTPATIENT_CLINIC_OR_DEPARTMENT_OTHER): Payer: BC Managed Care – PPO | Admitting: Oncology

## 2012-08-22 ENCOUNTER — Encounter: Payer: Self-pay | Admitting: Oncology

## 2012-08-22 VITALS — BP 100/48 | HR 75 | Temp 96.1°F | Resp 20

## 2012-08-22 VITALS — BP 113/64 | HR 83 | Temp 98.0°F | Resp 20 | Ht 74.5 in | Wt 203.8 lb

## 2012-08-22 DIAGNOSIS — C9110 Chronic lymphocytic leukemia of B-cell type not having achieved remission: Secondary | ICD-10-CM

## 2012-08-22 DIAGNOSIS — Z5112 Encounter for antineoplastic immunotherapy: Secondary | ICD-10-CM

## 2012-08-22 DIAGNOSIS — J984 Other disorders of lung: Secondary | ICD-10-CM

## 2012-08-22 DIAGNOSIS — D709 Neutropenia, unspecified: Secondary | ICD-10-CM

## 2012-08-22 DIAGNOSIS — Z5111 Encounter for antineoplastic chemotherapy: Secondary | ICD-10-CM

## 2012-08-22 DIAGNOSIS — D702 Other drug-induced agranulocytosis: Secondary | ICD-10-CM

## 2012-08-22 DIAGNOSIS — R599 Enlarged lymph nodes, unspecified: Secondary | ICD-10-CM

## 2012-08-22 LAB — CBC WITH DIFFERENTIAL/PLATELET
BASO%: 0.9 % (ref 0.0–2.0)
Basophils Absolute: 0 10*3/uL (ref 0.0–0.1)
EOS%: 2.3 % (ref 0.0–7.0)
MCH: 34.7 pg — ABNORMAL HIGH (ref 27.2–33.4)
MCHC: 33.4 g/dL (ref 32.0–36.0)
MCV: 103.8 fL — ABNORMAL HIGH (ref 79.3–98.0)
MONO%: 8.7 % (ref 0.0–14.0)
RBC: 2.88 10*6/uL — ABNORMAL LOW (ref 4.20–5.82)
RDW: 18.3 % — ABNORMAL HIGH (ref 11.0–14.6)
lymph#: 0.6 10*3/uL — ABNORMAL LOW (ref 0.9–3.3)

## 2012-08-22 LAB — COMPREHENSIVE METABOLIC PANEL (CC13)
AST: 33 U/L (ref 5–34)
Alkaline Phosphatase: 106 U/L (ref 40–150)
BUN: 13.6 mg/dL (ref 7.0–26.0)
Glucose: 184 mg/dl — ABNORMAL HIGH (ref 70–140)
Sodium: 140 mEq/L (ref 136–145)
Total Bilirubin: 0.8 mg/dL (ref 0.20–1.20)

## 2012-08-22 MED ORDER — DIPHENHYDRAMINE HCL 25 MG PO CAPS
50.0000 mg | ORAL_CAPSULE | Freq: Once | ORAL | Status: AC
  Administered 2012-08-22: 50 mg via ORAL

## 2012-08-22 MED ORDER — HEPARIN SOD (PORK) LOCK FLUSH 100 UNIT/ML IV SOLN
500.0000 [IU] | Freq: Once | INTRAVENOUS | Status: AC | PRN
  Administered 2012-08-22: 500 [IU]
  Filled 2012-08-22: qty 5

## 2012-08-22 MED ORDER — ACETAMINOPHEN 325 MG PO TABS
650.0000 mg | ORAL_TABLET | Freq: Once | ORAL | Status: AC
  Administered 2012-08-22: 650 mg via ORAL

## 2012-08-22 MED ORDER — SODIUM CHLORIDE 0.9 % IV SOLN
250.0000 mg/m2 | Freq: Once | INTRAVENOUS | Status: AC
  Administered 2012-08-22: 560 mg via INTRAVENOUS
  Filled 2012-08-22: qty 28

## 2012-08-22 MED ORDER — SODIUM CHLORIDE 0.9 % IJ SOLN
10.0000 mL | INTRAMUSCULAR | Status: AC | PRN
  Administered 2012-08-22: 10 mL
  Filled 2012-08-22: qty 10

## 2012-08-22 MED ORDER — SODIUM CHLORIDE 0.9 % IV SOLN
Freq: Once | INTRAVENOUS | Status: AC
  Administered 2012-08-22: 10:00:00 via INTRAVENOUS

## 2012-08-22 MED ORDER — SODIUM CHLORIDE 0.9 % IV SOLN
500.0000 mg/m2 | Freq: Once | INTRAVENOUS | Status: AC
  Administered 2012-08-22: 1100 mg via INTRAVENOUS
  Filled 2012-08-22: qty 110

## 2012-08-22 MED ORDER — AZITHROMYCIN 250 MG PO TABS: ORAL_TABLET | ORAL | Status: DC

## 2012-08-22 MED ORDER — ONDANSETRON 8 MG/50ML IVPB (CHCC)
8.0000 mg | Freq: Once | INTRAVENOUS | Status: AC
  Administered 2012-08-22: 8 mg via INTRAVENOUS

## 2012-08-22 MED ORDER — SODIUM CHLORIDE 0.9 % IV SOLN
25.0000 mg/m2 | Freq: Once | INTRAVENOUS | Status: AC
  Administered 2012-08-22: 57.5 mg via INTRAVENOUS
  Filled 2012-08-22: qty 2.3

## 2012-08-22 NOTE — Patient Instructions (Addendum)
[facility] Discharge Instructions for Patients Receiving Chemotherapy  Today you received the following chemotherapy agents Rituxan, Fludara and Cytoxan.  To help prevent nausea and vomiting after your treatment, we encourage you to take your nausea medication as prescribed.   If you develop nausea and vomiting that is not controlled by your nausea medication, call the clinic.   BELOW ARE SYMPTOMS THAT SHOULD BE REPORTED IMMEDIATELY:  *FEVER GREATER THAN 100.5 F  *CHILLS WITH OR WITHOUT FEVER  NAUSEA AND VOMITING THAT IS NOT CONTROLLED WITH YOUR NAUSEA MEDICATION  *UNUSUAL SHORTNESS OF BREATH  *UNUSUAL BRUISING OR BLEEDING  TENDERNESS IN MOUTH AND THROAT WITH OR WITHOUT PRESENCE OF ULCERS  *URINARY PROBLEMS  *BOWEL PROBLEMS  UNUSUAL RASH Items with * indicate a potential emergency and should be followed up as soon as possible.  Feel free to call the clinic you have any questions or concerns. The clinic phone number is [phone].    

## 2012-08-22 NOTE — Progress Notes (Signed)
Per Dr. Welton Flakes, its OK to treat today despite counts.

## 2012-08-22 NOTE — Patient Instructions (Addendum)
Proceed with IV chemotherapy today  IVIG next week  neulasta on 7/12  Chest x-ray this week  Azithromycin for 5 days

## 2012-08-22 NOTE — Progress Notes (Signed)
OFFICE PROGRESS NOTE  CC  Ryan Copeland [address] [place] [address]  DIAGNOSIS: 62 year old gentleman with stage III CLL with progressive disease.   PRIOR THERAPY:   #1. Begin chemotherapy today (06/20/12) consisting of FCR days 1 -3  #2 dental abscess requiring oral antibiotics  CURRENT THERAPY: cycle #3 of FCR days one through 3  INTERVAL HISTORY: Ryan Copeland 62 y.o. male returns for followup visit prior to his chemotherapy. Overall he's been doing well. However he does have a persistent productive cough with green flat. On my clinical examination on auscultation he is noted to have coarse breath sounds with wheezing. He is a known smoker. He does have an inhalers where she has been using. He has also been on oral antibiotics but in spite of that he continues to have what seems like a bronchitis. This could be due to his underlying malignancy. I have discussed this with him. Patient otherwise has no fevers chills or night sweats no headaches no shortness of breath he has no chest pains no palpitations. He continues to work full-time. He has no bruising or bleeding. Remainder of the 10 point review of systems is negative.  MEDICAL HISTORY: Past Medical History  Diagnosis Date  . 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 Hyperlipidemia   . Tobacco abuse   . PVD (peripheral vascular disease)   . 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 Diabetes mellitus     ALLERGIES:  is allergic to codeine.  MEDICATIONS:  Current Outpatient Prescriptions  Medication Sig Dispense Refill  . acyclovir (ZOVIRAX) 400 MG tablet Take 1 tablet (400 mg total) by mouth daily.  30 tablet  5  . allopurinol (ZYLOPRIM) 300 MG tablet Take 1 tablet (300 mg total) by mouth daily.  30 tablet  6  . ALPRAZolam (XANAX) 0.25 MG tablet Take 1 po q 6 hours prn for anxiety  90 tablet  1  . amoxicillin-clavulanate (AUGMENTIN) 875-125 MG per tablet Take 1  tablet by mouth 2 (two) times daily.  14 tablet  0  . aspirin 325 MG tablet Take 325 mg by mouth daily.        . B Complex Vitamins (VITAMIN B COMPLEX IJ) Inject as directed every 30 (thirty) days.      . ciprofloxacin (CIPRO) 500 MG tablet Take 1 tablet (500 mg total) by mouth 2 (two) times daily.  14 tablet  6  . cyanocobalamin (,VITAMIN B-12,) 1000 MCG/ML injection Inject 1 mL (1,000 mcg total) into the muscle every 30 (thirty) days.  1 mL  6  . dexamethasone (DECADRON) 4 MG tablet Take 2 tablets (8 mg total) by mouth 2 (two) times daily with a meal. Take daily starting the day after chemotherapy for 2 days. Take with food.  30 tablet  1  . diphenhydrAMINE (BENADRYL) 25 mg capsule Take 50 mg by mouth at bedtime as needed for itching or sleep.      Marland Kitchen esomeprazole (NEXIUM) 40 MG capsule Take 1 capsule (40 mg total) by mouth 2 (two) times daily.  180 capsule  3  . fluticasone (FLOVENT HFA) 110 MCG/ACT inhaler Inhale 1 puff into the lungs 2 (two) times daily.  1 Inhaler  12  . Ipratropium-Albuterol (COMBIVENT) 20-100 MCG/ACT AERS respimat Inhale 1 puff into the lungs every 6 (six) hours.  1 Inhaler  6  . lidocaine-prilocaine (EMLA) cream Apply topically as needed. Place cream on port site 1-2 hours  prior to treatment.  30 g  2  . LORazepam (ATIVAN) 0.5 MG tablet Take 1 tablet (0.5 mg total) by mouth every 6 (six) hours as needed (Nausea or vomiting).  30 tablet  0  . metFORMIN (GLUCOPHAGE-XR) 500 MG 24 hr tablet Take 500 mg by mouth 2 (two) times daily.      . metoprolol succinate (TOPROL-XL) 50 MG 24 hr tablet Take 50 mg by mouth daily with breakfast. Take with or immediately following a meal.      . ondansetron (ZOFRAN) 8 MG tablet Take 1 tablet (8 mg total) by mouth 2 (two) times daily. Take two times a day starting the day after chemo for 2 days. Then take two times a day as needed for nausea or vomiting.  30 tablet  1  . pregabalin (LYRICA) 75 MG capsule 1 cap three times daily and 2 caps at  bedtime  150 capsule  6  . sulfamethoxazole-trimethoprim (BACTRIM DS,SEPTRA DS) 800-160 MG per tablet Take 1 tablet by mouth 3 (three) times a week.  12 tablet  5  . tetrahydrozoline 0.05 % ophthalmic solution Place 1 drop into both eyes as needed. Red eyes      . topiramate (TOPAMAX) 25 MG capsule Take 1 capsule (25 mg total) by mouth 2 (two) times daily.  60 capsule  6  . varenicline (CHANTIX) 0.5 MG tablet Take 1 tablet (0.5 mg total) by mouth 2 (two) times daily.  360 tablet  0  . azithromycin (ZITHROMAX Z-PAK) 250 MG tablet Take 2 today and then 1 a day until completed  6 each  0  . nitroGLYCERIN (NITROSTAT) 0.4 MG SL tablet Place 1 tablet (0.4 mg total) under the tongue every 5 (five) minutes as needed.  25 tablet  12  . oxyCODONE (OXY IR/ROXICODONE) 5 MG immediate release tablet Take 1 tablet (5 mg total) by mouth every 4 (four) hours as needed for pain.  30 tablet  0  . prasugrel (EFFIENT) 10 MG TABS Take 10 mg by mouth daily with breakfast.      . prochlorperazine (COMPAZINE) 10 MG tablet Take 1 tablet (10 mg total) by mouth every 6 (six) hours as needed (Nausea or vomiting).  30 tablet  1  . prochlorperazine (COMPAZINE) 25 MG suppository Place 1 suppository (25 mg total) rectally every 12 (twelve) hours as needed for nausea.  12 suppository  3   No current facility-administered medications for this visit.   Facility-Administered Medications Ordered in Other Visits  Medication Dose Route Frequency Provider Last Rate Last Dose  . cyclophosphamide (CYTOXAN) 560 mg in sodium chloride 0.9 % 250 mL chemo infusion  250 mg/m2 (Treatment Plan Actual) Intravenous Once Victorino December, MD      . fludarabine (FLUDARA) 57.5 mg in sodium chloride 0.9 % 100 mL chemo infusion  25 mg/m2 (Treatment Plan Actual) Intravenous Once Victorino December, MD      . heparin lock flush 100 unit/mL  500 Units Intracatheter Once PRN Victorino December, MD      . sodium chloride 0.9 % injection 10 mL  10 mL Intracatheter PRN  Victorino December, MD        SURGICAL HISTORY:  Past Surgical History  Procedure Laterality Date  . Femoral stents    . Coronary stent placement  May 2011  . Cholecystectomy  2007    REVIEW OF SYSTEMS:   General: fatigue (-), night sweats (-), fever (-), pain (-) Lymph: palpable nodes (-) HEENT: vision  changes (-), mucositis (-), gum bleeding (-), epistaxis (-) Cardiovascular: chest pain (-), palpitations (-) Pulmonary: shortness of breath (-), dyspnea on exertion (-), cough (-), hemoptysis (-) GI:  Early satiety (-), melena (-), dysphagia (-), nausea/vomiting (-), diarrhea (-) GU: dysuria (-), hematuria (-), incontinence (-) Musculoskeletal: joint swelling (-), joint pain (-), back pain (-) Neuro: weakness (-), numbness (-), headache (-), confusion (-) Skin: Rash (-), lesions (-), dryness (-) Psych: depression (-), suicidal/homicidal ideation (-), feeling of hopelessness (-)   PHYSICAL EXAMINATION:  BP 113/64  Pulse 83  Temp(Src) 98 F (36.7 C) (Oral)  Resp 20  Ht 6' 2.5" (1.892 m)  Wt 203 lb 12.8 oz (92.443 kg)  BMI 25.82 kg/m2 General: Patient is a well appearing male in no acute distress HEENT: PERRLA, sclerae anicteric no conjunctival pallor, MMM Neck: supple, no palpable adenopathy Lungs: clear to auscultation bilaterally, no wheezes, rhonchi, or rales Cardiovascular: regular rate rhythm, S1, S2, no murmurs, rubs or gallops Abdomen: Soft, non-tender, non-distended, normoactive bowel sounds, no HSM Extremities: warm and well perfused, no clubbing, cyanosis, or edema Skin: No rashes or lesions Neuro: Non-focal ECOG PERFORMANCE STATUS: 0 - Asymptomatic  LABORATORY DATA: Lab Results  Component Value Date   WBC 2.2* 08/22/2012   HGB 10.0* 08/22/2012   HCT 29.9* 08/22/2012   MCV 103.8* 08/22/2012   PLT 33* 08/22/2012      Chemistry      Component Value Date/Time   NA 140 08/22/2012 0851   NA 134* 06/01/2012 1145   K 4.2 08/22/2012 0851   K 4.6 06/01/2012 1145   CL 104  08/03/2012 1229   CL 103 06/01/2012 1145   CO2 27 08/22/2012 0851   CO2 24 06/01/2012 1145   BUN 13.6 08/22/2012 0851   BUN 15 06/01/2012 1145   CREATININE 0.8 08/22/2012 0851   CREATININE 0.96 06/01/2012 1145      Component Value Date/Time   CALCIUM 9.3 08/22/2012 0851   CALCIUM 9.5 06/01/2012 1145   ALKPHOS 106 08/22/2012 0851   ALKPHOS 65 08/22/2011 0904   AST 33 08/22/2012 0851   AST 19 08/22/2011 0904   ALT 85* 08/22/2012 0851   ALT 27 08/22/2011 0904   BILITOT 0.80 08/22/2012 0851   BILITOT 0.5 08/22/2011 0904    FINAL DIAGNOSIS Diagnosis Bone Marrow, Aspirate,Biopsy, and Clot, right iliac bone - HYPERCELLULAR BONE MARROW WITH EXTENSIVE INVOLVEMENT BY CHRONIC LYMPHOCYTIC LEUKEMIA. PERIPHERAL BLOOD: - CHRONIC LYMPHOCYTIC LEUKEMIA. [name] MD Pathologist, Electronic Signature (Case signed 11/22/2011) GROSS AND MICROSCOPIC INFORMATION Specimen Clinical Information hx CLL [jl] Source Bone Marrow, Aspirate,Biopsy, and Clot, right iliac bone Microscopic LAB DATA: CBC performed on 11/18/11 shows: WBC 80.87 K/ul Neutrophils 5% HB 15.7 g/dl Lymphocytes 16% HCT 10.9 % Monocytes 1% MCV 96.0 fL Eosinophils 0% RDW 15.4 % Basophils 0% PLT 81 K/ul PERIPHERAL BLOOD SMEAR: The red blood cells display mild anisopoikilocytosis with minimal polychromasia. The white blood cells are markedly increased in number with lymphocytosis. The lymphocytes primarily consist of small lymphoid cells with generally high nuclear cytoplasmic ratio, dense chromatin with block type clumping, and inconspicuous nucleoli. There are numerous smudge cells in the 1 of 3 FINAL for CARLEE, TESFAYE [phone]) Microscopic(continued) background. A significant prolymphocytic component is not present. The platelets are decreased in number. BONE MARROW ASPIRATE: Erythroid precursors: Relatively decreased with progressive maturation. Granulocytic precursors: Relatively decreased with progressive maturation. Megakaryocytes: Scattered  forms are seen displaying normal morphology. Lymphocytes/plasma cells: The lymphocytes are markedly increased in number representing 95% of all  cells and consist of small lymphoid cells with high nuclear cytoplasmic ratio, dense chromatin and inconspicuous nucleoli. A significant prolymphocytic or large lymphoid cell component is not present. Significant plasma cell aggregates are not present. TOUCH PREPARATIONS: Predominance of small lymphoid cells. CLOT and BIOPSY: The sections show 90% cellularity with extensive infiltration of the medullary space by numerous aggregates, interstitial infiltrates and diffuse sheets of primarily small lymphoid cells displaying high nuclear cytoplasmic ratio, dense chromatin and small to inconspicuous nucleoli. Scattered small proliferation centers are seen. Myeloid hematopoiesis appears decreased in the background. Immunohistochemical stains for CD20, CD79a, CD3, CD5, CD10 and cyclin D1 were performed with appropriate controls. The lymphoid population is stains positively ffor CD79a (B cell marker) with patchy weaker positivity for CD20. There is patchy weak coexpression of CD5. No CD10 or cyclin D1 expression is identified. IRON STAIN: Iron stains are performed on a bone marrow aspirate smear and section of clot. The controls stained appropriately. Storage Iron: Decreased. Ringed Sideroblasts: Absent. ADDITIONAL DATA / TESTING: Specimen was sent for cytogenetic analysis and a separate report will follow. Flow cytometric analysis of the lymphoid population ([id number]) shows a major B cell population expressing pan B cell antigens including CD20 and CD23 associated with coexpression of CD5. This population shows extremely dim/negative staining for surface immunoglobulin light chains. (BNS:gt, 11/21/11) Specimen Table Bone Marrow count performed on 500 cells shows: Blasts: 0% Myeloid 3% Promyelocyts: 0% Myelocytes: 0% Erythroid 2% Metamyelocyts: 1% Bands: 1%  Lymphocytes: 95% Neutrophils: 1% Eosinophils: 0% Plasma Cells: 0% Basophils: 0% Monocytes: 0% M:E ratio: 1.5 Gross Received in [facility] are tissue fragments which aggregate 0.4 x 0.4 x 0.1 cm. The specimen is submitted in toto. Received in a separate container in [facility] are two cores of bone measuring 2.2 and 2.8 cm in length and each 0.2 cm in diameter. The specimen is submitted in toto following decalcification. (GP:eps 11/18/11) 2 of   RADIOGRAPHIC STUDIES: CT CHEST  Findings: Bilateral axillary lymphadenopathy is slightly  progressed compared to prior. For example, index node in the  right axilla measures 15 mm short axis (image 14) compared to 9 mm  on prior. Adjacent rounded 20 mm lymph node (#8) compares to 13 mm  on prior. There are clustered small supraclavicular lymph nodes  which also appears slightly enlarged. Within the mediastinum small  paratracheal lymph nodes are similar. No pericardial fluid.  Esophagus is normal. Coronary artery stent is noted.  Review of the lung parenchyma demonstrates no new suspicious  pulmonary nodules.  IMPRESSION:  1. Mild continued progressive enlargement of axillary  lymphadenopathy.  2. Stable supraclavicular and mediastinal adenopathy.  CT ABDOMEN AND PELVIS  Findings: Retroperitoneal lymphadenopathy is slightly progressed  compared to prior. For example conglomerate of nodes left of the  aorta measures 26 mm short axis compared to 22 mm on prior (image  71). Periportal lymph node measures 23 mm compared to 20 mm on  prior.  The spleen is massively enlarged with a calculated volume of 2362  ml compared to 1972 ml on prior.  Bilateral external iliac lymph nodes are increased in volume. Left  node measures 19mm short axis compared to 13 on prior. Inguinal  adenopathy is also increased.  The liver, liver is normal. Gallbladder is absent. The spleen,  adrenal glands, kidneys are normal.  The colon rectosigmoid colon are normal.  The  bladder prostate gland normal. No pelvic fluid.  IMPRESSION:  1. Progressive increase in retroperitoneal, iliac, and inguinal  lymphadenopathy.  2. Interval increase in  massive splenomegaly.     ASSESSMENT: 62 year old gentleman with  #1 CLL progressive Now receiving chemotherapy. His physical revealed decrease in his lymphadenopathy in the cervical and axillary area. Spleen is also improved. He will proceed with cycle #3 of FCR today. Patient is neutropenic and I have added Neulasta to his regimen.  #2 dental abscess resolved   #3 chronic pulmonary infection: I have given him azithromycin today. He will continue his inhalers. I have also discussed with the patient beginning him on IVIG on a monthly basis. He understands the rationale as does his wife. We will begin this in one week's time.  PLAN:  #1 proceed with cycle 3 of FCR.  #2 he will return in one week's time for followup and labs and IVIG  All questions were answered. The patient knows to call the clinic with any problems, questions or concerns. We can certainly see the patient much sooner if necessary.  I spent 25 minutes counseling the patient face to face. The total time spent in the appointment was 30 minutes.  Drue Second, MD Medical/Oncology [facility] [phone] (beeper) [phone] (Office)

## 2012-08-23 ENCOUNTER — Ambulatory Visit (HOSPITAL_BASED_OUTPATIENT_CLINIC_OR_DEPARTMENT_OTHER): Payer: BC Managed Care – PPO

## 2012-08-23 ENCOUNTER — Ambulatory Visit (HOSPITAL_COMMUNITY)
Admission: RE | Admit: 2012-08-23 | Discharge: 2012-08-23 | Disposition: A | Discharge status: Home / Self Care | Payer: BC Managed Care – PPO | Source: Ambulatory Visit | Attending: Oncology | Admitting: Oncology

## 2012-08-23 VITALS — BP 113/54 | HR 78 | Temp 98.2°F

## 2012-08-23 DIAGNOSIS — J984 Other disorders of lung: Secondary | ICD-10-CM | POA: Insufficient documentation

## 2012-08-23 DIAGNOSIS — I10 Essential (primary) hypertension: Secondary | ICD-10-CM | POA: Insufficient documentation

## 2012-08-23 DIAGNOSIS — Z5111 Encounter for antineoplastic chemotherapy: Secondary | ICD-10-CM

## 2012-08-23 DIAGNOSIS — C9110 Chronic lymphocytic leukemia of B-cell type not having achieved remission: Secondary | ICD-10-CM | POA: Insufficient documentation

## 2012-08-23 DIAGNOSIS — F17200 Nicotine dependence, unspecified, uncomplicated: Secondary | ICD-10-CM | POA: Insufficient documentation

## 2012-08-23 DIAGNOSIS — I700 Atherosclerosis of aorta: Secondary | ICD-10-CM | POA: Insufficient documentation

## 2012-08-23 MED ORDER — SODIUM CHLORIDE 0.9 % IV SOLN
250.0000 mg/m2 | Freq: Once | INTRAVENOUS | Status: AC
  Administered 2012-08-23: 560 mg via INTRAVENOUS
  Filled 2012-08-23: qty 28

## 2012-08-23 MED ORDER — SODIUM CHLORIDE 0.9 % IJ SOLN
10.0000 mL | INTRAMUSCULAR | Status: DC | PRN
  Administered 2012-08-23: 10 mL
  Filled 2012-08-23: qty 10

## 2012-08-23 MED ORDER — HEPARIN SOD (PORK) LOCK FLUSH 100 UNIT/ML IV SOLN
500.0000 [IU] | Freq: Once | INTRAVENOUS | Status: AC | PRN
  Administered 2012-08-23: 500 [IU]
  Filled 2012-08-23: qty 5

## 2012-08-23 MED ORDER — SODIUM CHLORIDE 0.9 % IV SOLN
Freq: Once | INTRAVENOUS | Status: AC
  Administered 2012-08-23: 12:00:00 via INTRAVENOUS

## 2012-08-23 MED ORDER — ONDANSETRON 8 MG/50ML IVPB (CHCC)
8.0000 mg | Freq: Once | INTRAVENOUS | Status: AC
  Administered 2012-08-23: 8 mg via INTRAVENOUS

## 2012-08-23 MED ORDER — SODIUM CHLORIDE 0.9 % IV SOLN
25.0000 mg/m2 | Freq: Once | INTRAVENOUS | Status: AC
  Administered 2012-08-23: 57.5 mg via INTRAVENOUS
  Filled 2012-08-23: qty 2.3

## 2012-08-23 NOTE — Patient Instructions (Addendum)
Cancer Center Discharge Instructions for Patients Receiving Chemotherapy  Today you received the following chemotherapy agents Fludara/Cytoxan To help prevent nausea and vomiting after your treatment, we encourage you to take your nausea medication as prescribed.   If you develop nausea and vomiting that is not controlled by your nausea medication, call the clinic.   BELOW ARE SYMPTOMS THAT SHOULD BE REPORTED IMMEDIATELY:  *FEVER GREATER THAN 100.5 F  *CHILLS WITH OR WITHOUT FEVER  NAUSEA AND VOMITING THAT IS NOT CONTROLLED WITH YOUR NAUSEA MEDICATION  *UNUSUAL SHORTNESS OF BREATH  *UNUSUAL BRUISING OR BLEEDING  TENDERNESS IN MOUTH AND THROAT WITH OR WITHOUT PRESENCE OF ULCERS  *URINARY PROBLEMS  *BOWEL PROBLEMS  UNUSUAL RASH Items with * indicate a potential emergency and should be followed up as soon as possible.  Feel free to call the clinic you have any questions or concerns. The clinic phone number is [phone].    

## 2012-08-24 ENCOUNTER — Other Ambulatory Visit: Payer: Self-pay | Admitting: Oncology

## 2012-08-24 ENCOUNTER — Ambulatory Visit (HOSPITAL_BASED_OUTPATIENT_CLINIC_OR_DEPARTMENT_OTHER): Payer: BC Managed Care – PPO

## 2012-08-24 ENCOUNTER — Other Ambulatory Visit: Payer: Self-pay

## 2012-08-24 DIAGNOSIS — Z5111 Encounter for antineoplastic chemotherapy: Secondary | ICD-10-CM

## 2012-08-24 DIAGNOSIS — C9112 Chronic lymphocytic leukemia of B-cell type in relapse: Secondary | ICD-10-CM

## 2012-08-24 DIAGNOSIS — C9110 Chronic lymphocytic leukemia of B-cell type not having achieved remission: Secondary | ICD-10-CM

## 2012-08-24 MED ORDER — TOPIRAMATE 25 MG PO CPSP: 25.0000 mg | ORAL_CAPSULE | Freq: Two times a day (BID) | ORAL | Status: DC

## 2012-08-24 MED ORDER — SODIUM CHLORIDE 0.9 % IJ SOLN
10.0000 mL | INTRAMUSCULAR | Status: DC | PRN
  Administered 2012-08-24: 10 mL
  Filled 2012-08-24: qty 10

## 2012-08-24 MED ORDER — SODIUM CHLORIDE 0.9 % IV SOLN
250.0000 mg/m2 | Freq: Once | INTRAVENOUS | Status: AC
  Administered 2012-08-24: 560 mg via INTRAVENOUS
  Filled 2012-08-24: qty 28

## 2012-08-24 MED ORDER — SODIUM CHLORIDE 0.9 % IV SOLN
25.0000 mg/m2 | Freq: Once | INTRAVENOUS | Status: AC
  Administered 2012-08-24: 57.5 mg via INTRAVENOUS
  Filled 2012-08-24: qty 2.3

## 2012-08-24 MED ORDER — HEPARIN SOD (PORK) LOCK FLUSH 100 UNIT/ML IV SOLN
500.0000 [IU] | Freq: Once | INTRAVENOUS | Status: AC | PRN
  Administered 2012-08-24: 500 [IU]
  Filled 2012-08-24: qty 5

## 2012-08-24 MED ORDER — ONDANSETRON 8 MG/50ML IVPB (CHCC)
8.0000 mg | Freq: Once | INTRAVENOUS | Status: AC
  Administered 2012-08-24: 8 mg via INTRAVENOUS

## 2012-08-24 MED ORDER — CYANOCOBALAMIN 1000 MCG/ML IJ SOLN: 1000.0000 ug | INTRAMUSCULAR | Status: DC

## 2012-08-24 MED ORDER — SODIUM CHLORIDE 0.9 % IV SOLN
Freq: Once | INTRAVENOUS | Status: AC
  Administered 2012-08-24: 13:00:00 via INTRAVENOUS

## 2012-08-24 NOTE — Patient Instructions (Addendum)
[facility] Discharge Instructions for Patients Receiving Chemotherapy  Today you received the following chemotherapy agents Fludara and Cytoxan.  To help prevent nausea and vomiting after your treatment, we encourage you to take your nausea medication as needed.    If you develop nausea and vomiting that is not controlled by your nausea medication, call the clinic.   BELOW ARE SYMPTOMS THAT SHOULD BE REPORTED IMMEDIATELY:  *FEVER GREATER THAN 100.5 F  *CHILLS WITH OR WITHOUT FEVER  NAUSEA AND VOMITING THAT IS NOT CONTROLLED WITH YOUR NAUSEA MEDICATION  *UNUSUAL SHORTNESS OF BREATH  *UNUSUAL BRUISING OR BLEEDING  TENDERNESS IN MOUTH AND THROAT WITH OR WITHOUT PRESENCE OF ULCERS  *URINARY PROBLEMS  *BOWEL PROBLEMS  UNUSUAL RASH Items with * indicate a potential emergency and should be followed up as soon as possible.  Feel free to call the clinic you have any questions or concerns. The clinic phone number is [phone].

## 2012-08-25 ENCOUNTER — Ambulatory Visit (HOSPITAL_BASED_OUTPATIENT_CLINIC_OR_DEPARTMENT_OTHER): Payer: BC Managed Care – PPO

## 2012-08-25 VITALS — BP 131/69 | HR 88 | Temp 97.0°F

## 2012-08-25 DIAGNOSIS — D709 Neutropenia, unspecified: Secondary | ICD-10-CM

## 2012-08-25 MED ORDER — PEGFILGRASTIM INJECTION 6 MG/0.6ML
6.0000 mg | Freq: Once | SUBCUTANEOUS | Status: AC
  Administered 2012-08-25: 6 mg via SUBCUTANEOUS

## 2012-08-25 NOTE — Patient Instructions (Addendum)

## 2012-08-29 ENCOUNTER — Ambulatory Visit (HOSPITAL_BASED_OUTPATIENT_CLINIC_OR_DEPARTMENT_OTHER): Payer: BC Managed Care – PPO | Admitting: Oncology

## 2012-08-29 ENCOUNTER — Ambulatory Visit (HOSPITAL_BASED_OUTPATIENT_CLINIC_OR_DEPARTMENT_OTHER): Payer: BC Managed Care – PPO

## 2012-08-29 ENCOUNTER — Telehealth: Payer: Self-pay | Admitting: *Deleted

## 2012-08-29 ENCOUNTER — Other Ambulatory Visit (HOSPITAL_BASED_OUTPATIENT_CLINIC_OR_DEPARTMENT_OTHER): Payer: BC Managed Care – PPO | Admitting: Lab

## 2012-08-29 VITALS — BP 107/63 | HR 82 | Temp 98.0°F | Resp 20 | Ht 74.5 in | Wt 202.9 lb

## 2012-08-29 VITALS — BP 105/47 | HR 78 | Temp 98.2°F | Resp 20

## 2012-08-29 DIAGNOSIS — C9110 Chronic lymphocytic leukemia of B-cell type not having achieved remission: Secondary | ICD-10-CM

## 2012-08-29 DIAGNOSIS — J984 Other disorders of lung: Secondary | ICD-10-CM

## 2012-08-29 LAB — COMPREHENSIVE METABOLIC PANEL (CC13)
ALT: 68 U/L — ABNORMAL HIGH (ref 0–55)
AST: 20 U/L (ref 5–34)
Albumin: 3.5 g/dL (ref 3.5–5.0)
Alkaline Phosphatase: 103 U/L (ref 40–150)
BUN: 14.4 mg/dL (ref 7.0–26.0)
Potassium: 4.3 mEq/L (ref 3.5–5.1)

## 2012-08-29 LAB — CBC WITH DIFFERENTIAL/PLATELET
BASO%: 0.9 % (ref 0.0–2.0)
EOS%: 0 % (ref 0.0–7.0)
HCT: 24.6 % — ABNORMAL LOW (ref 38.4–49.9)
MCH: 35.3 pg — ABNORMAL HIGH (ref 27.2–33.4)
MCHC: 34.1 g/dL (ref 32.0–36.0)
MONO#: 0.3 10*3/uL (ref 0.1–0.9)
NEUT%: 46.1 % (ref 39.0–75.0)
RBC: 2.38 10*6/uL — ABNORMAL LOW (ref 4.20–5.82)
RDW: 17.5 % — ABNORMAL HIGH (ref 11.0–14.6)
WBC: 1.2 10*3/uL — ABNORMAL LOW (ref 4.0–10.3)
lymph#: 0.3 10*3/uL — ABNORMAL LOW (ref 0.9–3.3)
nRBC: 0 % (ref 0–0)

## 2012-08-29 MED ORDER — HEPARIN SOD (PORK) LOCK FLUSH 100 UNIT/ML IV SOLN
500.0000 [IU] | Freq: Once | INTRAVENOUS | Status: AC | PRN
  Administered 2012-08-29: 500 [IU]
  Filled 2012-08-29: qty 5

## 2012-08-29 MED ORDER — ACETAMINOPHEN 325 MG PO TABS
650.0000 mg | ORAL_TABLET | Freq: Four times a day (QID) | ORAL | Status: DC | PRN
  Administered 2012-08-29: 650 mg via ORAL

## 2012-08-29 MED ORDER — IMMUNE GLOBULIN (HUMAN) 10 GM/100ML IV SOLN: 1.0000 g/kg | Freq: Once | INTRAVENOUS | Status: DC

## 2012-08-29 MED ORDER — IMMUNE GLOBULIN (HUMAN) 5 GM/100ML IV SOLN: 400.0000 mg/kg | Freq: Once | INTRAVENOUS | Status: DC

## 2012-08-29 MED ORDER — IMMUNE GLOBULIN (HUMAN) 5 GM/100ML IV SOLN
400.0000 mg/kg | Freq: Once | INTRAVENOUS | Status: AC
  Administered 2012-08-29: 35 g via INTRAVENOUS
  Filled 2012-08-29: qty 350

## 2012-08-29 MED ORDER — SODIUM CHLORIDE 0.9 % IJ SOLN
10.0000 mL | INTRAMUSCULAR | Status: DC | PRN
  Administered 2012-08-29: 10 mL
  Filled 2012-08-29: qty 10

## 2012-08-29 MED ORDER — DIPHENHYDRAMINE HCL 25 MG PO TABS
25.0000 mg | ORAL_TABLET | Freq: Once | ORAL | Status: AC
  Administered 2012-08-29: 25 mg via ORAL
  Filled 2012-08-29: qty 1

## 2012-08-29 MED ORDER — SODIUM CHLORIDE 0.9 % IV SOLN
Freq: Once | INTRAVENOUS | Status: AC
  Administered 2012-08-29: 13:00:00 via INTRAVENOUS

## 2012-08-29 NOTE — Telephone Encounter (Signed)
Per staff message and POF I have scheduled appts.  JMW  

## 2012-08-29 NOTE — Patient Instructions (Addendum)
 Cancer Center Discharge Instructions for Patients Receiving IVIG Today you received the following agent: IVIG  To help prevent nausea and vomiting after your treatment, we encourage you to take your nausea medication as per Dr. Welton Flakes. If you develop nausea and vomiting that is not controlled by your nausea medication, call the clinic.   BELOW ARE SYMPTOMS THAT SHOULD BE REPORTED IMMEDIATELY:  *FEVER GREATER THAN 100.5 F  *CHILLS WITH OR WITHOUT FEVER  NAUSEA AND VOMITING THAT IS NOT CONTROLLED WITH YOUR NAUSEA MEDICATION  *UNUSUAL SHORTNESS OF BREATH  *UNUSUAL BRUISING OR BLEEDING  TENDERNESS IN MOUTH AND THROAT WITH OR WITHOUT PRESENCE OF ULCERS  *URINARY PROBLEMS  *BOWEL PROBLEMS  UNUSUAL RASH Items with * indicate a potential emergency and should be followed up as soon as possible.  Feel free to call the clinic you have any questions or concerns. The clinic phone number is [phone].

## 2012-08-29 NOTE — Telephone Encounter (Signed)
appts made and printed. Pt is aware that i emailed MW to add trans blood...td

## 2012-08-29 NOTE — Patient Instructions (Addendum)
Proceed with IVIG today  I will see you back on 7/22 with cbc and type and cross  Possible blood transfusion on 7/23

## 2012-08-31 ENCOUNTER — Encounter (HOSPITAL_COMMUNITY)
Admission: RE | Admit: 2012-08-31 | Discharge: 2012-08-31 | Disposition: A | Discharge status: Home / Self Care | Payer: BC Managed Care – PPO | Source: Ambulatory Visit | Attending: Oncology | Admitting: Oncology

## 2012-08-31 DIAGNOSIS — C9110 Chronic lymphocytic leukemia of B-cell type not having achieved remission: Secondary | ICD-10-CM | POA: Insufficient documentation

## 2012-08-31 DIAGNOSIS — D6481 Anemia due to antineoplastic chemotherapy: Secondary | ICD-10-CM | POA: Insufficient documentation

## 2012-09-04 ENCOUNTER — Other Ambulatory Visit (HOSPITAL_BASED_OUTPATIENT_CLINIC_OR_DEPARTMENT_OTHER): Payer: BC Managed Care – PPO

## 2012-09-04 ENCOUNTER — Telehealth: Payer: Self-pay | Admitting: *Deleted

## 2012-09-04 ENCOUNTER — Ambulatory Visit (HOSPITAL_BASED_OUTPATIENT_CLINIC_OR_DEPARTMENT_OTHER): Payer: BC Managed Care – PPO | Admitting: Oncology

## 2012-09-04 VITALS — BP 105/65 | HR 74 | Temp 97.8°F | Resp 20 | Ht 74.5 in | Wt 202.7 lb

## 2012-09-04 DIAGNOSIS — C9110 Chronic lymphocytic leukemia of B-cell type not having achieved remission: Secondary | ICD-10-CM

## 2012-09-04 LAB — CBC WITH DIFFERENTIAL/PLATELET
Basophils Absolute: 0 10*3/uL (ref 0.0–0.1)
EOS%: 0.3 % (ref 0.0–7.0)
HCT: 26.8 % — ABNORMAL LOW (ref 38.4–49.9)
HGB: 9.3 g/dL — ABNORMAL LOW (ref 13.0–17.1)
MCH: 37.7 pg — ABNORMAL HIGH (ref 27.2–33.4)
MCHC: 34.9 g/dL (ref 32.0–36.0)
MCV: 108.3 fL — ABNORMAL HIGH (ref 79.3–98.0)
MONO%: 10.8 % (ref 0.0–14.0)
NEUT%: 80.6 % — ABNORMAL HIGH (ref 39.0–75.0)
RDW: 19.5 % — ABNORMAL HIGH (ref 11.0–14.6)

## 2012-09-04 LAB — COMPREHENSIVE METABOLIC PANEL (CC13)
AST: 29 U/L (ref 5–34)
Alkaline Phosphatase: 96 U/L (ref 40–150)
BUN: 13.9 mg/dL (ref 7.0–26.0)
Creatinine: 0.9 mg/dL (ref 0.7–1.3)

## 2012-09-04 NOTE — Progress Notes (Signed)
OFFICE PROGRESS NOTE  CC  [name] [address] [place] [address]  DIAGNOSIS: 62 year old gentleman with stage III CLL with progressive disease.   PRIOR THERAPY:   #1. Begin chemotherapy today (06/20/12) consisting of FCR days 1 -3  #2 dental abscess requiring oral antibiotics  CURRENT THERAPY: s/pcycle #3 of FCR days 1-3 from 7/9 - 7/11  INTERVAL HISTORY: TIM CORRIHER 62 y.o. male returns for followup visit after chemotherapy and IVIG,he feels significantly better. His cough has resolved, no fevers or chills, no nausea or vomiting, he is not neutropenic, platelets are improved. Remainder of the 10 point review of systems is negative.  MEDICAL HISTORY: Past Medical History  Diagnosis Date  . 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 Hyperlipidemia   . Tobacco abuse   . PVD (peripheral vascular disease)   . 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 Diabetes mellitus     ALLERGIES:  is allergic to codeine.  MEDICATIONS:  Current Outpatient Prescriptions  Medication Sig Dispense Refill  . acyclovir (ZOVIRAX) 400 MG tablet Take 1 tablet (400 mg total) by mouth daily.  30 tablet  5  . allopurinol (ZYLOPRIM) 300 MG tablet Take 1 tablet (300 mg total) by mouth daily.  30 tablet  6  . ALPRAZolam (XANAX) 0.25 MG tablet Take 1 po q 6 hours prn for anxiety  90 tablet  1  . amoxicillin-clavulanate (AUGMENTIN) 875-125 MG per tablet Take 1 tablet by mouth 2 (two) times daily.  14 tablet  0  . aspirin 325 MG tablet Take 325 mg by mouth daily.        . B Complex Vitamins (VITAMIN B COMPLEX IJ) Inject as directed every 30 (thirty) days.      . ciprofloxacin (CIPRO) 500 MG tablet Take 1 tablet (500 mg total) by mouth 2 (two) times daily.  14 tablet  6  . cyanocobalamin (,VITAMIN B-12,) 1000 MCG/ML injection Inject 1 mL (1,000 mcg total) into the muscle every 30 (thirty) days.  3 mL  1  . dexamethasone (DECADRON) 4 MG tablet  Take 2 tablets (8 mg total) by mouth 2 (two) times daily with a meal. Take daily starting the day after chemotherapy for 2 days. Take with food.  30 tablet  1  . diphenhydrAMINE (BENADRYL) 25 mg capsule Take 50 mg by mouth at bedtime as needed for itching or sleep.      Marland Kitchen esomeprazole (NEXIUM) 40 MG capsule Take 1 capsule (40 mg total) by mouth 2 (two) times daily.  180 capsule  3  . fluticasone (FLOVENT HFA) 110 MCG/ACT inhaler Inhale 1 puff into the lungs 2 (two) times daily.  1 Inhaler  12  . Ipratropium-Albuterol (COMBIVENT) 20-100 MCG/ACT AERS respimat Inhale 1 puff into the lungs every 6 (six) hours.  1 Inhaler  6  . lidocaine-prilocaine (EMLA) cream Apply topically as needed. Place cream on port site 1-2 hours prior to treatment.  30 g  2  . LORazepam (ATIVAN) 0.5 MG tablet Take 1 tablet (0.5 mg total) by mouth every 6 (six) hours as needed (Nausea or vomiting).  30 tablet  0  . metFORMIN (GLUCOPHAGE-XR) 500 MG 24 hr tablet Take 500 mg by mouth 2 (two) times daily.      . nitroGLYCERIN (NITROSTAT) 0.4 MG SL tablet Place 1 tablet (0.4 mg total) under the tongue every 5 (five) minutes as needed.  25 tablet  12  . pregabalin (LYRICA)  75 MG capsule 1 cap three times daily and 2 caps at bedtime  150 capsule  6  . tetrahydrozoline 0.05 % ophthalmic solution Place 1 drop into both eyes as needed. Red eyes      . topiramate (TOPAMAX) 25 MG capsule Take 1 capsule (25 mg total) by mouth 2 (two) times daily.  180 capsule  1  . metoprolol succinate (TOPROL-XL) 50 MG 24 hr tablet Take 50 mg by mouth daily with breakfast. Take with or immediately following a meal.      . ondansetron (ZOFRAN) 8 MG tablet Take 1 tablet (8 mg total) by mouth 2 (two) times daily. Take two times a day starting the day after chemo for 2 days. Then take two times a day as needed for nausea or vomiting.  30 tablet  1  . oxyCODONE (OXY IR/ROXICODONE) 5 MG immediate release tablet Take 1 tablet (5 mg total) by mouth every 4 (four)  hours as needed for pain.  30 tablet  0  . prasugrel (EFFIENT) 10 MG TABS Take 10 mg by mouth daily with breakfast.      . prochlorperazine (COMPAZINE) 10 MG tablet Take 1 tablet (10 mg total) by mouth every 6 (six) hours as needed (Nausea or vomiting).  30 tablet  1  . prochlorperazine (COMPAZINE) 25 MG suppository Place 1 suppository (25 mg total) rectally every 12 (twelve) hours as needed for nausea.  12 suppository  3  . sulfamethoxazole-trimethoprim (BACTRIM DS,SEPTRA DS) 800-160 MG per tablet Take 1 tablet by mouth 3 (three) times a week.  12 tablet  5   No current facility-administered medications for this visit.   Facility-Administered Medications Ordered in Other Visits  Medication Dose Route Frequency Provider Last Rate Last Dose  . sodium chloride 0.9 % injection 10 mL  10 mL Intracatheter PRN Victorino December, MD   10 mL at 08/22/12 1721    SURGICAL HISTORY:  Past Surgical History  Procedure Laterality Date  . Femoral stents    . Coronary stent placement  May 2011  . Cholecystectomy  2007    REVIEW OF SYSTEMS:   General: fatigue (-), night sweats (-), fever (-), pain (-) Lymph: palpable nodes (-) HEENT: vision changes (-), mucositis (-), gum bleeding (-), epistaxis (-) Cardiovascular: chest pain (-), palpitations (-) Pulmonary: shortness of breath (-), dyspnea on exertion (-), cough (-), hemoptysis (-) GI:  Early satiety (-), melena (-), dysphagia (-), nausea/vomiting (-), diarrhea (-) GU: dysuria (-), hematuria (-), incontinence (-) Musculoskeletal: joint swelling (-), joint pain (-), back pain (-) Neuro: weakness (-), numbness (-), headache (-), confusion (-) Skin: Rash (-), lesions (-), dryness (-) Psych: depression (-), suicidal/homicidal ideation (-), feeling of hopelessness (-)   PHYSICAL EXAMINATION:  BP 105/65  Pulse 74  Temp(Src) 97.8 F (36.6 C) (Oral)  Resp 20  Ht 6' 2.5" (1.892 m)  Wt 202 lb 11.2 oz (91.944 kg)  BMI 25.69 kg/m2 General: Patient is a  well appearing male in no acute distress HEENT: PERRLA, sclerae anicteric no conjunctival pallor, MMM Neck: supple, no palpable adenopathy Lungs: clear to auscultation bilaterally, no wheezes, rhonchi, or rales Cardiovascular: regular rate rhythm, S1, S2, no murmurs, rubs or gallops Abdomen: Soft, non-tender, non-distended, normoactive bowel sounds, no HSM Extremities: warm and well perfused, no clubbing, cyanosis, or edema Skin: No rashes or lesions Neuro: Non-focal ECOG PERFORMANCE STATUS: 0 - Asymptomatic  LABORATORY DATA: Lab Results  Component Value Date   WBC 3.4* 09/04/2012   HGB 9.3* 09/04/2012  HCT 26.8* 09/04/2012   MCV 108.3* 09/04/2012   PLT 47* 09/04/2012      Chemistry      Component Value Date/Time   NA 138 09/04/2012 1315   NA 134* 06/01/2012 1145   K 4.2 09/04/2012 1315   K 4.6 06/01/2012 1145   CL 104 08/03/2012 1229   CL 103 06/01/2012 1145   CO2 27 09/04/2012 1315   CO2 24 06/01/2012 1145   BUN 13.9 09/04/2012 1315   BUN 15 06/01/2012 1145   CREATININE 0.9 09/04/2012 1315   CREATININE 0.96 06/01/2012 1145      Component Value Date/Time   CALCIUM 9.1 09/04/2012 1315   CALCIUM 9.5 06/01/2012 1145   ALKPHOS 96 09/04/2012 1315   ALKPHOS 65 08/22/2011 0904   AST 29 09/04/2012 1315   AST 19 08/22/2011 0904   ALT 70* 09/04/2012 1315   ALT 27 08/22/2011 0904   BILITOT 0.49 09/04/2012 1315   BILITOT 0.5 08/22/2011 0904    FINAL DIAGNOSIS Diagnosis Bone Marrow, Aspirate,Biopsy, and Clot, right iliac bone - HYPERCELLULAR BONE MARROW WITH EXTENSIVE INVOLVEMENT BY CHRONIC LYMPHOCYTIC LEUKEMIA. PERIPHERAL BLOOD: - CHRONIC LYMPHOCYTIC LEUKEMIA. [name] MD Pathologist, Electronic Signature (Case signed 11/22/2011) GROSS AND MICROSCOPIC INFORMATION Specimen Clinical Information hx CLL [jl] Source Bone Marrow, Aspirate,Biopsy, and Clot, right iliac bone Microscopic LAB DATA: CBC performed on 11/18/11 shows: WBC 80.87 K/ul Neutrophils 5% HB 15.7 g/dl Lymphocytes 96% HCT  04.5 % Monocytes 1% MCV 96.0 fL Eosinophils 0% RDW 15.4 % Basophils 0% PLT 81 K/ul PERIPHERAL BLOOD SMEAR: The red blood cells display mild anisopoikilocytosis with minimal polychromasia. The white blood cells are markedly increased in number with lymphocytosis. The lymphocytes primarily consist of small lymphoid cells with generally high nuclear cytoplasmic ratio, dense chromatin with block type clumping, and inconspicuous nucleoli. There are numerous smudge cells in the 1 of 3 FINAL for SID, GREENER [phone]) Microscopic(continued) background. A significant prolymphocytic component is not present. The platelets are decreased in number. BONE MARROW ASPIRATE: Erythroid precursors: Relatively decreased with progressive maturation. Granulocytic precursors: Relatively decreased with progressive maturation. Megakaryocytes: Scattered forms are seen displaying normal morphology. Lymphocytes/plasma cells: The lymphocytes are markedly increased in number representing 95% of all cells and consist of small lymphoid cells with high nuclear cytoplasmic ratio, dense chromatin and inconspicuous nucleoli. A significant prolymphocytic or large lymphoid cell component is not present. Significant plasma cell aggregates are not present. TOUCH PREPARATIONS: Predominance of small lymphoid cells. CLOT and BIOPSY: The sections show 90% cellularity with extensive infiltration of the medullary space by numerous aggregates, interstitial infiltrates and diffuse sheets of primarily small lymphoid cells displaying high nuclear cytoplasmic ratio, dense chromatin and small to inconspicuous nucleoli. Scattered small proliferation centers are seen. Myeloid hematopoiesis appears decreased in the background. Immunohistochemical stains for CD20, CD79a, CD3, CD5, CD10 and cyclin D1 were performed with appropriate controls. The lymphoid population is stains positively ffor CD79a (B cell marker) with patchy weaker positivity  for CD20. There is patchy weak coexpression of CD5. No CD10 or cyclin D1 expression is identified. IRON STAIN: Iron stains are performed on a bone marrow aspirate smear and section of clot. The controls stained appropriately. Storage Iron: Decreased. Ringed Sideroblasts: Absent. ADDITIONAL DATA / TESTING: Specimen was sent for cytogenetic analysis and a separate report will follow. Flow cytometric analysis of the lymphoid population ([id number]) shows a major B cell population expressing pan B cell antigens including CD20 and CD23 associated with coexpression of CD5. This population shows extremely dim/negative staining for  surface immunoglobulin light chains. (BNS:gt, 11/21/11) Specimen Table Bone Marrow count performed on 500 cells shows: Blasts: 0% Myeloid 3% Promyelocyts: 0% Myelocytes: 0% Erythroid 2% Metamyelocyts: 1% Bands: 1% Lymphocytes: 95% Neutrophils: 1% Eosinophils: 0% Plasma Cells: 0% Basophils: 0% Monocytes: 0% M:E ratio: 1.5 Gross Received in [facility] are tissue fragments which aggregate 0.4 x 0.4 x 0.1 cm. The specimen is submitted in toto. Received in a separate container in [facility] are two cores of bone measuring 2.2 and 2.8 cm in length and each 0.2 cm in diameter. The specimen is submitted in toto following decalcification. (GP:eps 11/18/11) 2 of   RADIOGRAPHIC STUDIES: CT CHEST  Findings: Bilateral axillary lymphadenopathy is slightly  progressed compared to prior. For example, index node in the  right axilla measures 15 mm short axis (image 14) compared to 9 mm  on prior. Adjacent rounded 20 mm lymph node (#8) compares to 13 mm  on prior. There are clustered small supraclavicular lymph nodes  which also appears slightly enlarged. Within the mediastinum small  paratracheal lymph nodes are similar. No pericardial fluid.  Esophagus is normal. Coronary artery stent is noted.  Review of the lung parenchyma demonstrates no new suspicious  pulmonary nodules.   IMPRESSION:  1. Mild continued progressive enlargement of axillary  lymphadenopathy.  2. Stable supraclavicular and mediastinal adenopathy.  CT ABDOMEN AND PELVIS  Findings: Retroperitoneal lymphadenopathy is slightly progressed  compared to prior. For example conglomerate of nodes left of the  aorta measures 26 mm short axis compared to 22 mm on prior (image  71). Periportal lymph node measures 23 mm compared to 20 mm on  prior.  The spleen is massively enlarged with a calculated volume of 2362  ml compared to 1972 ml on prior.  Bilateral external iliac lymph nodes are increased in volume. Left  node measures 19mm short axis compared to 13 on prior. Inguinal  adenopathy is also increased.  The liver, liver is normal. Gallbladder is absent. The spleen,  adrenal glands, kidneys are normal.  The colon rectosigmoid colon are normal.  The bladder prostate gland normal. No pelvic fluid.  IMPRESSION:  1. Progressive increase in retroperitoneal, iliac, and inguinal  lymphadenopathy.  2. Interval increase in massive splenomegaly.     ASSESSMENT: 62 year old gentleman with  #1 CLL progressive Now receiving chemotherapy. His physical revealed decrease in his lymphadenopathy in the cervical and axillary area. Spleen is also improved. He is s/p cycle #3 of FCR . He did receive neulasta with cycle 3 of his chemotherapy due to profound neutropenia  #2 dental abscess resolved   #3 chronic pulmonary infection: He has been on prolonged course of oral antibiotics with now resolution of this.   #4 He is s/p IVIG dose 1 last week on 08/31/12, which he tolerated well. He tells me that his cough and productive sputum has resolved and he feels much better, we will continue IVIG monthly  PLAN:  #1 Clinically doing well  #2 He will return in 2 weeks for cycle 4 of FCR and the following week he will recive IVIG  #3. He knows to call with any problems  All questions were answered. The patient knows  to call the clinic with any problems, questions or concerns. We can certainly see the patient much sooner if necessary.  I spent 25 minutes counseling the patient face to face. The total time spent in the appointment was 30 minutes.  Drue Second, MD Medical/Oncology [facility] [phone] (beeper) [phone] (Office)

## 2012-09-04 NOTE — Telephone Encounter (Signed)
Per staff message and POF I have scheduled appts.  JMW  

## 2012-09-04 NOTE — Telephone Encounter (Signed)
appts made and printed. Pt request to come back in the evening to have his tx on 09/26/12, i made MW aware to add the tx for the evening...td

## 2012-09-19 ENCOUNTER — Telehealth: Payer: Self-pay | Admitting: *Deleted

## 2012-09-19 ENCOUNTER — Ambulatory Visit (HOSPITAL_BASED_OUTPATIENT_CLINIC_OR_DEPARTMENT_OTHER): Payer: BC Managed Care – PPO

## 2012-09-19 ENCOUNTER — Other Ambulatory Visit (HOSPITAL_BASED_OUTPATIENT_CLINIC_OR_DEPARTMENT_OTHER): Payer: BC Managed Care – PPO

## 2012-09-19 ENCOUNTER — Ambulatory Visit (HOSPITAL_BASED_OUTPATIENT_CLINIC_OR_DEPARTMENT_OTHER): Payer: BC Managed Care – PPO | Admitting: Oncology

## 2012-09-19 ENCOUNTER — Encounter: Payer: Self-pay | Admitting: Oncology

## 2012-09-19 VITALS — BP 105/65 | HR 63 | Temp 98.0°F

## 2012-09-19 VITALS — BP 113/67 | HR 77 | Temp 98.4°F | Resp 20 | Ht 74.5 in | Wt 206.8 lb

## 2012-09-19 DIAGNOSIS — Z5111 Encounter for antineoplastic chemotherapy: Secondary | ICD-10-CM

## 2012-09-19 DIAGNOSIS — C9110 Chronic lymphocytic leukemia of B-cell type not having achieved remission: Secondary | ICD-10-CM

## 2012-09-19 DIAGNOSIS — Z5112 Encounter for antineoplastic immunotherapy: Secondary | ICD-10-CM

## 2012-09-19 LAB — CBC WITH DIFFERENTIAL/PLATELET
Basophils Absolute: 0 10*3/uL (ref 0.0–0.1)
EOS%: 0.5 % (ref 0.0–7.0)
HCT: 30.3 % — ABNORMAL LOW (ref 38.4–49.9)
HGB: 10.1 g/dL — ABNORMAL LOW (ref 13.0–17.1)
MCH: 35.9 pg — ABNORMAL HIGH (ref 27.2–33.4)
MCV: 107.8 fL — ABNORMAL HIGH (ref 79.3–98.0)
MONO%: 6.3 % (ref 0.0–14.0)
NEUT%: 76.1 % — ABNORMAL HIGH (ref 39.0–75.0)
Platelets: 45 10*3/uL — ABNORMAL LOW (ref 140–400)

## 2012-09-19 LAB — COMPREHENSIVE METABOLIC PANEL (CC13)
AST: 34 U/L (ref 5–34)
BUN: 12.5 mg/dL (ref 7.0–26.0)
Calcium: 9 mg/dL (ref 8.4–10.4)
Chloride: 107 mEq/L (ref 98–109)
Creatinine: 0.9 mg/dL (ref 0.7–1.3)

## 2012-09-19 MED ORDER — DIPHENHYDRAMINE HCL 25 MG PO CAPS
50.0000 mg | ORAL_CAPSULE | Freq: Once | ORAL | Status: AC
  Administered 2012-09-19: 50 mg via ORAL

## 2012-09-19 MED ORDER — SODIUM CHLORIDE 0.9 % IV SOLN
500.0000 mg/m2 | Freq: Once | INTRAVENOUS | Status: AC
  Administered 2012-09-19: 1100 mg via INTRAVENOUS
  Filled 2012-09-19: qty 110

## 2012-09-19 MED ORDER — ONDANSETRON 8 MG/50ML IVPB (CHCC)
8.0000 mg | Freq: Once | INTRAVENOUS | Status: AC
  Administered 2012-09-19: 8 mg via INTRAVENOUS

## 2012-09-19 MED ORDER — SODIUM CHLORIDE 0.9 % IJ SOLN
10.0000 mL | INTRAMUSCULAR | Status: DC | PRN
  Administered 2012-09-19: 10 mL
  Filled 2012-09-19: qty 10

## 2012-09-19 MED ORDER — SODIUM CHLORIDE 0.9 % IV SOLN
25.0000 mg/m2 | Freq: Once | INTRAVENOUS | Status: AC
  Administered 2012-09-19: 57.5 mg via INTRAVENOUS
  Filled 2012-09-19: qty 2.3

## 2012-09-19 MED ORDER — ACETAMINOPHEN 325 MG PO TABS
650.0000 mg | ORAL_TABLET | Freq: Once | ORAL | Status: AC
  Administered 2012-09-19: 650 mg via ORAL

## 2012-09-19 MED ORDER — SODIUM CHLORIDE 0.9 % IV SOLN
250.0000 mg/m2 | Freq: Once | INTRAVENOUS | Status: AC
  Administered 2012-09-19: 560 mg via INTRAVENOUS
  Filled 2012-09-19: qty 28

## 2012-09-19 MED ORDER — SODIUM CHLORIDE 0.9 % IV SOLN
Freq: Once | INTRAVENOUS | Status: AC
  Administered 2012-09-19: 10:00:00 via INTRAVENOUS

## 2012-09-19 MED ORDER — HEPARIN SOD (PORK) LOCK FLUSH 100 UNIT/ML IV SOLN
500.0000 [IU] | Freq: Once | INTRAVENOUS | Status: AC | PRN
  Administered 2012-09-19: 500 [IU]
  Filled 2012-09-19: qty 5

## 2012-09-19 NOTE — Patient Instructions (Addendum)
[facility] Discharge Instructions for Patients Receiving Chemotherapy  Today you received the following chemotherapy agents :  Rituxan, fludara, Cytoxan  To help prevent nausea and vomiting after your treatment, we encourage you to take your nausea medication  If you develop nausea and vomiting that is not controlled by your nausea medication, call the clinic.   BELOW ARE SYMPTOMS THAT SHOULD BE REPORTED IMMEDIATELY:  *FEVER GREATER THAN 100.5 F  *CHILLS WITH OR WITHOUT FEVER  NAUSEA AND VOMITING THAT IS NOT CONTROLLED WITH YOUR NAUSEA MEDICATION  *UNUSUAL SHORTNESS OF BREATH  *UNUSUAL BRUISING OR BLEEDING  TENDERNESS IN MOUTH AND THROAT WITH OR WITHOUT PRESENCE OF ULCERS  *URINARY PROBLEMS  *BOWEL PROBLEMS  UNUSUAL RASH Items with * indicate a potential emergency and should be followed up as soon as possible.  Feel free to call the clinic you have any questions or concerns. The clinic phone number is [phone].

## 2012-09-19 NOTE — Patient Instructions (Addendum)
Proceed with cycle 4 of chemotherapy days 1 -3  You will return on 8/9 for neulasta injection

## 2012-09-19 NOTE — Telephone Encounter (Signed)
appts made and printed...td 

## 2012-09-19 NOTE — Progress Notes (Signed)
OFFICE PROGRESS NOTE  CC  [name] [address] [place] [address]  DIAGNOSIS: 62 year old gentleman with stage III CLL with progressive disease.   PRIOR THERAPY:   #1. Begin chemotherapy today (06/20/12) consisting of FCR days 1 -3  #2 dental abscess requiring oral antibiotics  #3 monthly IVIG due to infections and immunosuppression  CURRENT THERAPY: s/pcycle #3 of FCR days one through 3 given on 7/9; patient will now begin monthly IVIG  INTERVAL HISTORY: RYLEE NUZUM 62 y.o. male returns for followup visitOverall he's been doing well.  he does have a persistent productive cough with green flat. On my clinical examination on auscultation he is noted to have coarse breath sounds with wheezing. He is a known smoker. He does have an inhalers where she has been using. He has also been on oral antibiotics but in spite of that he continues to have what seems like a bronchitis. This could be due to his underlying malignancy. I have discussed this with him. Patient otherwise has no fevers chills or night sweats no headaches no shortness of breath he has no chest pains no palpitations. He continues to work full-time. He has no bruising or bleeding. Remainder of the 10 point review of systems is negative.  MEDICAL HISTORY: Past Medical History  Diagnosis Date  . 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 Hyperlipidemia   . Tobacco abuse   . PVD (peripheral vascular disease)   . 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 Diabetes mellitus     ALLERGIES:  is allergic to codeine.  MEDICATIONS:  Current Outpatient Prescriptions  Medication Sig Dispense Refill  . acyclovir (ZOVIRAX) 400 MG tablet Take 1 tablet (400 mg total) by mouth daily.  30 tablet  5  . allopurinol (ZYLOPRIM) 300 MG tablet Take 1 tablet (300 mg total) by mouth daily.  30 tablet  6  . ALPRAZolam (XANAX) 0.25 MG tablet Take 1 po q 6 hours prn for anxiety  90  tablet  1  . amoxicillin-clavulanate (AUGMENTIN) 875-125 MG per tablet Take 1 tablet by mouth 2 (two) times daily.  14 tablet  0  . aspirin 325 MG tablet Take 325 mg by mouth daily.        . B Complex Vitamins (VITAMIN B COMPLEX IJ) Inject as directed every 30 (thirty) days.      . cyanocobalamin (,VITAMIN B-12,) 1000 MCG/ML injection Inject 1 mL (1,000 mcg total) into the muscle every 30 (thirty) days.  3 mL  1  . dexamethasone (DECADRON) 4 MG tablet Take 2 tablets (8 mg total) by mouth 2 (two) times daily with a meal. Take daily starting the day after chemotherapy for 2 days. Take with food.  30 tablet  1  . diphenhydrAMINE (BENADRYL) 25 mg capsule Take 50 mg by mouth at bedtime as needed for itching or sleep.      Marland Kitchen esomeprazole (NEXIUM) 40 MG capsule Take 1 capsule (40 mg total) by mouth 2 (two) times daily.  180 capsule  3  . fluticasone (FLOVENT HFA) 110 MCG/ACT inhaler Inhale 1 puff into the lungs 2 (two) times daily.  1 Inhaler  12  . Ipratropium-Albuterol (COMBIVENT) 20-100 MCG/ACT AERS respimat Inhale 1 puff into the lungs every 6 (six) hours.  1 Inhaler  6  . lidocaine-prilocaine (EMLA) cream Apply topically as needed. Place cream on port site 1-2 hours prior to treatment.  30 g  2  . LORazepam (  ATIVAN) 0.5 MG tablet Take 1 tablet (0.5 mg total) by mouth every 6 (six) hours as needed (Nausea or vomiting).  30 tablet  0  . metFORMIN (GLUCOPHAGE-XR) 500 MG 24 hr tablet Take 500 mg by mouth 2 (two) times daily.      . metoprolol succinate (TOPROL-XL) 50 MG 24 hr tablet Take 50 mg by mouth daily with breakfast. Take with or immediately following a meal.      . ondansetron (ZOFRAN) 8 MG tablet Take 1 tablet (8 mg total) by mouth 2 (two) times daily. Take two times a day starting the day after chemo for 2 days. Then take two times a day as needed for nausea or vomiting.  30 tablet  1  . prasugrel (EFFIENT) 10 MG TABS Take 10 mg by mouth daily with breakfast.      . pregabalin (LYRICA) 75 MG  capsule 1 cap three times daily and 2 caps at bedtime  150 capsule  6  . sulfamethoxazole-trimethoprim (BACTRIM DS,SEPTRA DS) 800-160 MG per tablet Take 1 tablet by mouth 3 (three) times a week.  12 tablet  5  . tetrahydrozoline 0.05 % ophthalmic solution Place 1 drop into both eyes as needed. Red eyes      . ciprofloxacin (CIPRO) 500 MG tablet Take 1 tablet (500 mg total) by mouth 2 (two) times daily.  14 tablet  6  . nitroGLYCERIN (NITROSTAT) 0.4 MG SL tablet Place 1 tablet (0.4 mg total) under the tongue every 5 (five) minutes as needed.  25 tablet  12  . oxyCODONE (OXY IR/ROXICODONE) 5 MG immediate release tablet Take 1 tablet (5 mg total) by mouth every 4 (four) hours as needed for pain.  30 tablet  0  . prochlorperazine (COMPAZINE) 10 MG tablet Take 1 tablet (10 mg total) by mouth every 6 (six) hours as needed (Nausea or vomiting).  30 tablet  1  . prochlorperazine (COMPAZINE) 25 MG suppository Place 1 suppository (25 mg total) rectally every 12 (twelve) hours as needed for nausea.  12 suppository  3  . topiramate (TOPAMAX) 25 MG capsule Take 1 capsule (25 mg total) by mouth 2 (two) times daily.  180 capsule  1   No current facility-administered medications for this visit.   Facility-Administered Medications Ordered in Other Visits  Medication Dose Route Frequency Provider Last Rate Last Dose  . sodium chloride 0.9 % injection 10 mL  10 mL Intracatheter PRN Victorino December, MD   10 mL at 08/22/12 1721    SURGICAL HISTORY:  Past Surgical History  Procedure Laterality Date  . Femoral stents    . Coronary stent placement  May 2011  . Cholecystectomy  2007    REVIEW OF SYSTEMS:   General: fatigue (-), night sweats (-), fever (-), pain (-) Lymph: palpable nodes (-) HEENT: vision changes (-), mucositis (-), gum bleeding (-), epistaxis (-) Cardiovascular: chest pain (-), palpitations (-) Pulmonary: shortness of breath (-), dyspnea on exertion (-), cough (-), hemoptysis (-) GI:  Early  satiety (-), melena (-), dysphagia (-), nausea/vomiting (-), diarrhea (-) GU: dysuria (-), hematuria (-), incontinence (-) Musculoskeletal: joint swelling (-), joint pain (-), back pain (-) Neuro: weakness (-), numbness (-), headache (-), confusion (-) Skin: Rash (-), lesions (-), dryness (-) Psych: depression (-), suicidal/homicidal ideation (-), feeling of hopelessness (-)   PHYSICAL EXAMINATION:  BP 107/63  Pulse 82  Temp(Src) 98 F (36.7 C) (Oral)  Resp 20  Ht 6' 2.5" (1.892 m)  Wt 202 lb  14.4 oz (92.035 kg)  BMI 25.71 kg/m2 General: Patient is a well appearing male in no acute distress HEENT: PERRLA, sclerae anicteric no conjunctival pallor, MMM Neck: supple, no palpable adenopathy Lungs: clear to auscultation bilaterally, no wheezes, rhonchi, or rales Cardiovascular: regular rate rhythm, S1, S2, no murmurs, rubs or gallops Abdomen: Soft, non-tender, non-distended, normoactive bowel sounds, no HSM Extremities: warm and well perfused, no clubbing, cyanosis, or edema Skin: No rashes or lesions Neuro: Non-focal ECOG PERFORMANCE STATUS: 0 - Asymptomatic  LABORATORY DATA: Lab Results  Component Value Date   WBC 3.7* 09/19/2012   HGB 10.1* 09/19/2012   HCT 30.3* 09/19/2012   MCV 107.8* 09/19/2012   PLT 45* 09/19/2012      Chemistry      Component Value Date/Time   NA 139 09/19/2012 0853   NA 134* 06/01/2012 1145   K 4.2 09/19/2012 0853   K 4.6 06/01/2012 1145   CL 104 08/03/2012 1229   CL 103 06/01/2012 1145   CO2 24 09/19/2012 0853   CO2 24 06/01/2012 1145   BUN 12.5 09/19/2012 0853   BUN 15 06/01/2012 1145   CREATININE 0.9 09/19/2012 0853   CREATININE 0.96 06/01/2012 1145      Component Value Date/Time   CALCIUM 9.0 09/19/2012 0853   CALCIUM 9.5 06/01/2012 1145   ALKPHOS 103 09/19/2012 0853   ALKPHOS 65 08/22/2011 0904   AST 34 09/19/2012 0853   AST 19 08/22/2011 0904   ALT 87* 09/19/2012 0853   ALT 27 08/22/2011 0904   BILITOT 0.53 09/19/2012 0853   BILITOT 0.5 08/22/2011 0904    FINAL  DIAGNOSIS Diagnosis Bone Marrow, Aspirate,Biopsy, and Clot, right iliac bone - HYPERCELLULAR BONE MARROW WITH EXTENSIVE INVOLVEMENT BY CHRONIC LYMPHOCYTIC LEUKEMIA. PERIPHERAL BLOOD: - CHRONIC LYMPHOCYTIC LEUKEMIA. [name] MD Pathologist, Electronic Signature (Case signed 11/22/2011) GROSS AND MICROSCOPIC INFORMATION Specimen Clinical Information hx CLL [jl] Source Bone Marrow, Aspirate,Biopsy, and Clot, right iliac bone Microscopic LAB DATA: CBC performed on 11/18/11 shows: WBC 80.87 K/ul Neutrophils 5% HB 15.7 g/dl Lymphocytes 45% HCT 40.9 % Monocytes 1% MCV 96.0 fL Eosinophils 0% RDW 15.4 % Basophils 0% PLT 81 K/ul PERIPHERAL BLOOD SMEAR: The red blood cells display mild anisopoikilocytosis with minimal polychromasia. The white blood cells are markedly increased in number with lymphocytosis. The lymphocytes primarily consist of small lymphoid cells with generally high nuclear cytoplasmic ratio, dense chromatin with block type clumping, and inconspicuous nucleoli. There are numerous smudge cells in the 1 of 3 FINAL for LAVELL, SUPPLE [phone]) Microscopic(continued) background. A significant prolymphocytic component is not present. The platelets are decreased in number. BONE MARROW ASPIRATE: Erythroid precursors: Relatively decreased with progressive maturation. Granulocytic precursors: Relatively decreased with progressive maturation. Megakaryocytes: Scattered forms are seen displaying normal morphology. Lymphocytes/plasma cells: The lymphocytes are markedly increased in number representing 95% of all cells and consist of small lymphoid cells with high nuclear cytoplasmic ratio, dense chromatin and inconspicuous nucleoli. A significant prolymphocytic or large lymphoid cell component is not present. Significant plasma cell aggregates are not present. TOUCH PREPARATIONS: Predominance of small lymphoid cells. CLOT and BIOPSY: The sections show 90% cellularity with  extensive infiltration of the medullary space by numerous aggregates, interstitial infiltrates and diffuse sheets of primarily small lymphoid cells displaying high nuclear cytoplasmic ratio, dense chromatin and small to inconspicuous nucleoli. Scattered small proliferation centers are seen. Myeloid hematopoiesis appears decreased in the background. Immunohistochemical stains for CD20, CD79a, CD3, CD5, CD10 and cyclin D1 were performed with appropriate controls. The lymphoid population  is stains positively ffor CD79a (B cell marker) with patchy weaker positivity for CD20. There is patchy weak coexpression of CD5. No CD10 or cyclin D1 expression is identified. IRON STAIN: Iron stains are performed on a bone marrow aspirate smear and section of clot. The controls stained appropriately. Storage Iron: Decreased. Ringed Sideroblasts: Absent. ADDITIONAL DATA / TESTING: Specimen was sent for cytogenetic analysis and a separate report will follow. Flow cytometric analysis of the lymphoid population ([id number]) shows a major B cell population expressing pan B cell antigens including CD20 and CD23 associated with coexpression of CD5. This population shows extremely dim/negative staining for surface immunoglobulin light chains. (BNS:gt, 11/21/11) Specimen Table Bone Marrow count performed on 500 cells shows: Blasts: 0% Myeloid 3% Promyelocyts: 0% Myelocytes: 0% Erythroid 2% Metamyelocyts: 1% Bands: 1% Lymphocytes: 95% Neutrophils: 1% Eosinophils: 0% Plasma Cells: 0% Basophils: 0% Monocytes: 0% M:E ratio: 1.5 Gross Received in [facility] are tissue fragments which aggregate 0.4 x 0.4 x 0.1 cm. The specimen is submitted in toto. Received in a separate container in [facility] are two cores of bone measuring 2.2 and 2.8 cm in length and each 0.2 cm in diameter. The specimen is submitted in toto following decalcification. (GP:eps 11/18/11) 2 of   RADIOGRAPHIC STUDIES: CT CHEST  Findings: Bilateral  axillary lymphadenopathy is slightly  progressed compared to prior. For example, index node in the  right axilla measures 15 mm short axis (image 14) compared to 9 mm  on prior. Adjacent rounded 20 mm lymph node (#8) compares to 13 mm  on prior. There are clustered small supraclavicular lymph nodes  which also appears slightly enlarged. Within the mediastinum small  paratracheal lymph nodes are similar. No pericardial fluid.  Esophagus is normal. Coronary artery stent is noted.  Review of the lung parenchyma demonstrates no new suspicious  pulmonary nodules.  IMPRESSION:  1. Mild continued progressive enlargement of axillary  lymphadenopathy.  2. Stable supraclavicular and mediastinal adenopathy.  CT ABDOMEN AND PELVIS  Findings: Retroperitoneal lymphadenopathy is slightly progressed  compared to prior. For example conglomerate of nodes left of the  aorta measures 26 mm short axis compared to 22 mm on prior (image  71). Periportal lymph node measures 23 mm compared to 20 mm on  prior.  The spleen is massively enlarged with a calculated volume of 2362  ml compared to 1972 ml on prior.  Bilateral external iliac lymph nodes are increased in volume. Left  node measures 19mm short axis compared to 13 on prior. Inguinal  adenopathy is also increased.  The liver, liver is normal. Gallbladder is absent. The spleen,  adrenal glands, kidneys are normal.  The colon rectosigmoid colon are normal.  The bladder prostate gland normal. No pelvic fluid.  IMPRESSION:  1. Progressive increase in retroperitoneal, iliac, and inguinal  lymphadenopathy.  2. Interval increase in massive splenomegaly.     ASSESSMENT: 62 year old gentleman with  #1 CLL progressive Now receiving chemotherapy. His physical revealed decrease in his lymphadenopathy in the cervical and axillary area. Spleen is also improved. He s/p  cycle #3 of FCR today. He did receive Neulasta for neutropenia. And he will continue to route  see this after every cycle now.  #2 dental abscess resolved   #3 chronic pulmonary infection: I have given him azithromycin today. He will continue his inhalers. I have also discussed with the patient beginning him on IVIG on a monthly basis. He understands the rationale as does his wife. We will begin this in one week's time.  PLAN:  #1patient will proceed with IVIG today. Risks and benefits of this were discussed with the patient.  #2 he will be seen back on 7/22 with CBC and type and cross for possible transfusion on 09/05/2012  All questions were answered. The patient knows to call the clinic with any problems, questions or concerns. We can certainly see the patient much sooner if necessary.  I spent 25 minutes counseling the patient face to face. The total time spent in the appointment was 30 minutes.  Drue Second, MD Medical/Oncology [facility] [phone] (beeper) [phone] (Office)

## 2012-09-20 ENCOUNTER — Ambulatory Visit (HOSPITAL_BASED_OUTPATIENT_CLINIC_OR_DEPARTMENT_OTHER): Payer: BC Managed Care – PPO

## 2012-09-20 VITALS — BP 101/46 | HR 72 | Temp 98.3°F | Resp 18

## 2012-09-20 DIAGNOSIS — C9110 Chronic lymphocytic leukemia of B-cell type not having achieved remission: Secondary | ICD-10-CM

## 2012-09-20 DIAGNOSIS — Z5111 Encounter for antineoplastic chemotherapy: Secondary | ICD-10-CM

## 2012-09-20 MED ORDER — SODIUM CHLORIDE 0.9 % IV SOLN
Freq: Once | INTRAVENOUS | Status: AC
  Administered 2012-09-20: 14:00:00 via INTRAVENOUS

## 2012-09-20 MED ORDER — SODIUM CHLORIDE 0.9 % IV SOLN
250.0000 mg/m2 | Freq: Once | INTRAVENOUS | Status: AC
  Administered 2012-09-20: 560 mg via INTRAVENOUS
  Filled 2012-09-20: qty 28

## 2012-09-20 MED ORDER — HEPARIN SOD (PORK) LOCK FLUSH 100 UNIT/ML IV SOLN
500.0000 [IU] | Freq: Once | INTRAVENOUS | Status: AC | PRN
  Administered 2012-09-20: 500 [IU]
  Filled 2012-09-20: qty 5

## 2012-09-20 MED ORDER — ONDANSETRON 8 MG/50ML IVPB (CHCC)
8.0000 mg | Freq: Once | INTRAVENOUS | Status: AC
  Administered 2012-09-20: 8 mg via INTRAVENOUS

## 2012-09-20 MED ORDER — SODIUM CHLORIDE 0.9 % IV SOLN
25.0000 mg/m2 | Freq: Once | INTRAVENOUS | Status: AC
  Administered 2012-09-20: 57.5 mg via INTRAVENOUS
  Filled 2012-09-20: qty 2.3

## 2012-09-20 MED ORDER — SODIUM CHLORIDE 0.9 % IJ SOLN
10.0000 mL | INTRAMUSCULAR | Status: DC | PRN
  Administered 2012-09-20: 10 mL
  Filled 2012-09-20: qty 10

## 2012-09-20 NOTE — Patient Instructions (Addendum)
[facility] Discharge Instructions for Patients Receiving Chemotherapy  Today you received the following chemotherapy agents fludara, cytoxan  To help prevent nausea and vomiting after your treatment, we encourage you to take your nausea medication as needed   If you develop nausea and vomiting that is not controlled by your nausea medication, call the clinic.   BELOW ARE SYMPTOMS THAT SHOULD BE REPORTED IMMEDIATELY:  *FEVER GREATER THAN 100.5 F  *CHILLS WITH OR WITHOUT FEVER  NAUSEA AND VOMITING THAT IS NOT CONTROLLED WITH YOUR NAUSEA MEDICATION  *UNUSUAL SHORTNESS OF BREATH  *UNUSUAL BRUISING OR BLEEDING  TENDERNESS IN MOUTH AND THROAT WITH OR WITHOUT PRESENCE OF ULCERS  *URINARY PROBLEMS  *BOWEL PROBLEMS  UNUSUAL RASH Items with * indicate a potential emergency and should be followed up as soon as possible.  Feel free to call the clinic you have any questions or concerns. The clinic phone number is [phone].

## 2012-09-21 ENCOUNTER — Ambulatory Visit (HOSPITAL_BASED_OUTPATIENT_CLINIC_OR_DEPARTMENT_OTHER): Payer: BC Managed Care – PPO

## 2012-09-21 ENCOUNTER — Other Ambulatory Visit: Payer: Self-pay | Admitting: *Deleted

## 2012-09-21 VITALS — BP 119/57 | HR 72 | Temp 97.8°F

## 2012-09-21 DIAGNOSIS — Z5111 Encounter for antineoplastic chemotherapy: Secondary | ICD-10-CM

## 2012-09-21 DIAGNOSIS — C9110 Chronic lymphocytic leukemia of B-cell type not having achieved remission: Secondary | ICD-10-CM

## 2012-09-21 MED ORDER — HEPARIN SOD (PORK) LOCK FLUSH 100 UNIT/ML IV SOLN
500.0000 [IU] | Freq: Once | INTRAVENOUS | Status: AC | PRN
  Administered 2012-09-21: 500 [IU]
  Filled 2012-09-21: qty 5

## 2012-09-21 MED ORDER — SODIUM CHLORIDE 0.9 % IJ SOLN
10.0000 mL | INTRAMUSCULAR | Status: DC | PRN
  Administered 2012-09-21: 10 mL
  Filled 2012-09-21: qty 10

## 2012-09-21 MED ORDER — SODIUM CHLORIDE 0.9 % IV SOLN
250.0000 mg/m2 | Freq: Once | INTRAVENOUS | Status: AC
  Administered 2012-09-21: 560 mg via INTRAVENOUS
  Filled 2012-09-21: qty 28

## 2012-09-21 MED ORDER — LIDOCAINE-PRILOCAINE 2.5-2.5 % EX CREA: TOPICAL_CREAM | CUTANEOUS | Status: DC | PRN

## 2012-09-21 MED ORDER — ONDANSETRON 8 MG/50ML IVPB (CHCC)
8.0000 mg | Freq: Once | INTRAVENOUS | Status: AC
  Administered 2012-09-21: 8 mg via INTRAVENOUS

## 2012-09-21 MED ORDER — SODIUM CHLORIDE 0.9 % IV SOLN
25.0000 mg/m2 | Freq: Once | INTRAVENOUS | Status: AC
  Administered 2012-09-21: 57.5 mg via INTRAVENOUS
  Filled 2012-09-21: qty 2.3

## 2012-09-21 MED ORDER — SODIUM CHLORIDE 0.9 % IV SOLN
Freq: Once | INTRAVENOUS | Status: AC
  Administered 2012-09-21: 14:00:00 via INTRAVENOUS

## 2012-09-21 NOTE — Patient Instructions (Addendum)
[facility] Discharge Instructions for Patients Receiving Chemotherapy  Today you received the following chemotherapy agents:  Fludara and Cytoxan  To help prevent nausea and vomiting after your treatment, we encourage you to take your nausea medication as ordered per MD.   If you develop nausea and vomiting that is not controlled by your nausea medication, call the clinic.   BELOW ARE SYMPTOMS THAT SHOULD BE REPORTED IMMEDIATELY:  *FEVER GREATER THAN 100.5 F  *CHILLS WITH OR WITHOUT FEVER  NAUSEA AND VOMITING THAT IS NOT CONTROLLED WITH YOUR NAUSEA MEDICATION  *UNUSUAL SHORTNESS OF BREATH  *UNUSUAL BRUISING OR BLEEDING  TENDERNESS IN MOUTH AND THROAT WITH OR WITHOUT PRESENCE OF ULCERS  *URINARY PROBLEMS  *BOWEL PROBLEMS  UNUSUAL RASH Items with * indicate a potential emergency and should be followed up as soon as possible.  Feel free to call the clinic you have any questions or concerns. The clinic phone number is [phone].

## 2012-09-21 NOTE — Progress Notes (Signed)
Dr. Welton Flakes aware of platelet count 45 as of 09/19/12. OK to treat per MD.

## 2012-09-22 ENCOUNTER — Ambulatory Visit (HOSPITAL_BASED_OUTPATIENT_CLINIC_OR_DEPARTMENT_OTHER): Payer: BC Managed Care – PPO

## 2012-09-22 VITALS — BP 136/75 | HR 72 | Temp 97.9°F | Resp 18

## 2012-09-22 DIAGNOSIS — D709 Neutropenia, unspecified: Secondary | ICD-10-CM

## 2012-09-22 DIAGNOSIS — C9110 Chronic lymphocytic leukemia of B-cell type not having achieved remission: Secondary | ICD-10-CM

## 2012-09-22 MED ORDER — PEGFILGRASTIM INJECTION 6 MG/0.6ML
6.0000 mg | Freq: Once | SUBCUTANEOUS | Status: AC
  Administered 2012-09-22: 6 mg via SUBCUTANEOUS

## 2012-09-23 NOTE — Progress Notes (Signed)
OFFICE PROGRESS NOTE  CC  Ryan Copeland [address] [place] [address]  DIAGNOSIS: 62 year old gentleman with stage III CLL with progressive disease.   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 Patient began IVIG q. monthly  CURRENT THERAPY:  cycle #4 of FCR days 1-3   INTERVAL HISTORY: Ryan Copeland 62 y.o. male returns for followup visit prior to chemotherapy ,he feels significantly better. His cough has resolved, no fevers or chills, no nausea or vomiting, he is not neutropenic, platelets are improved. Remainder of the 10 point review of systems is negative.  MEDICAL HISTORY: Past Medical History  Diagnosis Date  . 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 Hyperlipidemia   . Tobacco abuse   . PVD (peripheral vascular disease)   . 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 Diabetes mellitus     ALLERGIES:  is allergic to codeine.  MEDICATIONS:  Current Outpatient Prescriptions  Medication Sig Dispense Refill  . acyclovir (ZOVIRAX) 400 MG tablet Take 1 tablet (400 mg total) by mouth daily.  30 tablet  5  . allopurinol (ZYLOPRIM) 300 MG tablet Take 1 tablet (300 mg total) by mouth daily.  30 tablet  6  . ALPRAZolam (XANAX) 0.25 MG tablet Take 1 po q 6 hours prn for anxiety  90 tablet  1  . aspirin 325 MG tablet Take 325 mg by mouth daily.        . ciprofloxacin (CIPRO) 500 MG tablet Take 1 tablet (500 mg total) by mouth 2 (two) times daily.  14 tablet  6  . dexamethasone (DECADRON) 4 MG tablet Take 2 tablets (8 mg total) by mouth 2 (two) times daily with a meal. Take daily starting the day after chemotherapy for 2 days. Take with food.  30 tablet  1  . diphenhydrAMINE (BENADRYL) 25 mg capsule Take 50 mg by mouth at bedtime as needed for itching or sleep.      Marland Kitchen esomeprazole (NEXIUM) 40 MG  capsule Take 1 capsule (40 mg total) by mouth 2 (two) times daily.  180 capsule  3  . fluticasone (FLOVENT HFA) 110 MCG/ACT inhaler Inhale 1 puff into the lungs 2 (two) times daily.  1 Inhaler  12  . Ipratropium-Albuterol (COMBIVENT) 20-100 MCG/ACT AERS respimat Inhale 1 puff into the lungs every 6 (six) hours.  1 Inhaler  6  . LORazepam (ATIVAN) 0.5 MG tablet Take 1 tablet (0.5 mg total) by mouth every 6 (six) hours as needed (Nausea or vomiting).  30 tablet  0  . metFORMIN (GLUCOPHAGE-XR) 500 MG 24 hr tablet Take 500 mg by mouth 2 (two) times daily.      . metoprolol succinate (TOPROL-XL) 50 MG 24 hr tablet Take 50 mg by mouth daily with breakfast. Take with or immediately following a meal.      . ondansetron (ZOFRAN) 8 MG tablet Take 1 tablet (8 mg total) by mouth 2 (two) times daily. Take two times a day starting the day after chemo for 2 days. Then take two times a day as needed for nausea or vomiting.  30 tablet  1  . pregabalin (LYRICA) 75 MG capsule 1 cap three times daily and 2 caps at bedtime  150 capsule  6  . sulfamethoxazole-trimethoprim (BACTRIM DS,SEPTRA DS) 800-160 MG per tablet Take 1 tablet by mouth 3 (  three) times a week.  12 tablet  5  . tetrahydrozoline 0.05 % ophthalmic solution Place 1 drop into both eyes as needed. Red eyes      . topiramate (TOPAMAX) 25 MG capsule Take 1 capsule (25 mg total) by mouth 2 (two) times daily.  180 capsule  1  . amoxicillin-clavulanate (AUGMENTIN) 875-125 MG per tablet Take 1 tablet by mouth 2 (two) times daily.  14 tablet  0  . B Complex Vitamins (VITAMIN B COMPLEX IJ) Inject as directed every 30 (thirty) days.      . cyanocobalamin (,VITAMIN B-12,) 1000 MCG/ML injection Inject 1 mL (1,000 mcg total) into the muscle every 30 (thirty) days.  3 mL  1  . lidocaine-prilocaine (EMLA) cream Apply topically as needed. Place cream on port site 1-2 hours prior to treatment.  30 g  2  . nitroGLYCERIN (NITROSTAT) 0.4 MG SL tablet Place 1 tablet (0.4 mg  total) under the tongue every 5 (five) minutes as needed.  25 tablet  12  . oxyCODONE (OXY IR/ROXICODONE) 5 MG immediate release tablet Take 1 tablet (5 mg total) by mouth every 4 (four) hours as needed for pain.  30 tablet  0  . prasugrel (EFFIENT) 10 MG TABS Take 10 mg by mouth daily with breakfast.      . prochlorperazine (COMPAZINE) 10 MG tablet Take 1 tablet (10 mg total) by mouth every 6 (six) hours as needed (Nausea or vomiting).  30 tablet  1  . prochlorperazine (COMPAZINE) 25 MG suppository Place 1 suppository (25 mg total) rectally every 12 (twelve) hours as needed for nausea.  12 suppository  3   No current facility-administered medications for this visit.   Facility-Administered Medications Ordered in Other Visits  Medication Dose Route Frequency Provider Last Rate Last Dose  . sodium chloride 0.9 % injection 10 mL  10 mL Intracatheter PRN Victorino December, MD   10 mL at 08/22/12 1721    SURGICAL HISTORY:  Past Surgical History  Procedure Laterality Date  . Femoral stents    . Coronary stent placement  May 2011  . Cholecystectomy  2007    REVIEW OF SYSTEMS:   General: fatigue (-), night sweats (-), fever (-), pain (-) Lymph: palpable nodes (-) HEENT: vision changes (-), mucositis (-), gum bleeding (-), epistaxis (-) Cardiovascular: chest pain (-), palpitations (-) Pulmonary: shortness of breath (-), dyspnea on exertion (-), cough (-), hemoptysis (-) GI:  Early satiety (-), melena (-), dysphagia (-), nausea/vomiting (-), diarrhea (-) GU: dysuria (-), hematuria (-), incontinence (-) Musculoskeletal: joint swelling (-), joint pain (-), back pain (-) Neuro: weakness (-), numbness (-), headache (-), confusion (-) Skin: Rash (-), lesions (-), dryness (-) Psych: depression (-), suicidal/homicidal ideation (-), feeling of hopelessness (-)   PHYSICAL EXAMINATION:  BP 113/67  Pulse 77  Temp(Src) 98.4 F (36.9 C) (Oral)  Resp 20  Ht 6' 2.5" (1.892 m)  Wt 206 lb 12.8 oz  (93.804 kg)  BMI 26.2 kg/m2 General: Patient is a well appearing male in no acute distress HEENT: PERRLA, sclerae anicteric no conjunctival pallor, MMM Neck: supple, no palpable adenopathy Lungs: clear to auscultation bilaterally, no wheezes, rhonchi, or rales Cardiovascular: regular rate rhythm, S1, S2, no murmurs, rubs or gallops Abdomen: Soft, non-tender, non-distended, normoactive bowel sounds, no HSM Extremities: warm and well perfused, no clubbing, cyanosis, or edema Skin: No rashes or lesions Neuro: Non-focal ECOG PERFORMANCE STATUS: 0 - Asymptomatic  LABORATORY DATA: Lab Results  Component Value Date  WBC 3.7* 09/19/2012   HGB 10.1* 09/19/2012   HCT 30.3* 09/19/2012   MCV 107.8* 09/19/2012   PLT 45* 09/19/2012      Chemistry      Component Value Date/Time   NA 139 09/19/2012 0853   NA 134* 06/01/2012 1145   K 4.2 09/19/2012 0853   K 4.6 06/01/2012 1145   CL 104 08/03/2012 1229   CL 103 06/01/2012 1145   CO2 24 09/19/2012 0853   CO2 24 06/01/2012 1145   BUN 12.5 09/19/2012 0853   BUN 15 06/01/2012 1145   CREATININE 0.9 09/19/2012 0853   CREATININE 0.96 06/01/2012 1145      Component Value Date/Time   CALCIUM 9.0 09/19/2012 0853   CALCIUM 9.5 06/01/2012 1145   ALKPHOS 103 09/19/2012 0853   ALKPHOS 65 08/22/2011 0904   AST 34 09/19/2012 0853   AST 19 08/22/2011 0904   ALT 87* 09/19/2012 0853   ALT 27 08/22/2011 0904   BILITOT 0.53 09/19/2012 0853   BILITOT 0.5 08/22/2011 0904    FINAL DIAGNOSIS Diagnosis Bone Marrow, Aspirate,Biopsy, and Clot, right iliac bone - HYPERCELLULAR BONE MARROW WITH EXTENSIVE INVOLVEMENT BY CHRONIC LYMPHOCYTIC LEUKEMIA. PERIPHERAL BLOOD: - CHRONIC LYMPHOCYTIC LEUKEMIA. [name] MD Pathologist, Electronic Signature (Case signed 11/22/2011) GROSS AND MICROSCOPIC INFORMATION Specimen Clinical Information hx CLL [jl] Source Bone Marrow, Aspirate,Biopsy, and Clot, right iliac bone Microscopic LAB DATA: CBC performed on 11/18/11 shows: WBC 80.87 K/ul Neutrophils  5% HB 15.7 g/dl Lymphocytes 16% HCT 10.9 % Monocytes 1% MCV 96.0 fL Eosinophils 0% RDW 15.4 % Basophils 0% PLT 81 K/ul PERIPHERAL BLOOD SMEAR: The red blood cells display mild anisopoikilocytosis with minimal polychromasia. The white blood cells are markedly increased in number with lymphocytosis. The lymphocytes primarily consist of small lymphoid cells with generally high nuclear cytoplasmic ratio, dense chromatin with block type clumping, and inconspicuous nucleoli. There are numerous smudge cells in the 1 of 3 FINAL for OCTAVIANO, MUKAI [phone]) Microscopic(continued) background. A significant prolymphocytic component is not present. The platelets are decreased in number. BONE MARROW ASPIRATE: Erythroid precursors: Relatively decreased with progressive maturation. Granulocytic precursors: Relatively decreased with progressive maturation. Megakaryocytes: Scattered forms are seen displaying normal morphology. Lymphocytes/plasma cells: The lymphocytes are markedly increased in number representing 95% of all cells and consist of small lymphoid cells with high nuclear cytoplasmic ratio, dense chromatin and inconspicuous nucleoli. A significant prolymphocytic or large lymphoid cell component is not present. Significant plasma cell aggregates are not present. TOUCH PREPARATIONS: Predominance of small lymphoid cells. CLOT and BIOPSY: The sections show 90% cellularity with extensive infiltration of the medullary space by numerous aggregates, interstitial infiltrates and diffuse sheets of primarily small lymphoid cells displaying high nuclear cytoplasmic ratio, dense chromatin and small to inconspicuous nucleoli. Scattered small proliferation centers are seen. Myeloid hematopoiesis appears decreased in the background. Immunohistochemical stains for CD20, CD79a, CD3, CD5, CD10 and cyclin D1 were performed with appropriate controls. The lymphoid population is stains positively ffor CD79a (B cell  marker) with patchy weaker positivity for CD20. There is patchy weak coexpression of CD5. No CD10 or cyclin D1 expression is identified. IRON STAIN: Iron stains are performed on a bone marrow aspirate smear and section of clot. The controls stained appropriately. Storage Iron: Decreased. Ringed Sideroblasts: Absent. ADDITIONAL DATA / TESTING: Specimen was sent for cytogenetic analysis and a separate report will follow. Flow cytometric analysis of the lymphoid population ([id number]) shows a major B cell population expressing pan B cell antigens including CD20 and CD23 associated with  coexpression of CD5. This population shows extremely dim/negative staining for surface immunoglobulin light chains. (BNS:gt, 11/21/11) Specimen Table Bone Marrow count performed on 500 cells shows: Blasts: 0% Myeloid 3% Promyelocyts: 0% Myelocytes: 0% Erythroid 2% Metamyelocyts: 1% Bands: 1% Lymphocytes: 95% Neutrophils: 1% Eosinophils: 0% Plasma Cells: 0% Basophils: 0% Monocytes: 0% M:E ratio: 1.5 Gross Received in [facility] are tissue fragments which aggregate 0.4 x 0.4 x 0.1 cm. The specimen is submitted in toto. Received in a separate container in [facility] are two cores of bone measuring 2.2 and 2.8 cm in length and each 0.2 cm in diameter. The specimen is submitted in toto following decalcification. (GP:eps 11/18/11) 2 of   RADIOGRAPHIC STUDIES: CT CHEST  Findings: Bilateral axillary lymphadenopathy is slightly  progressed compared to prior. For example, index node in the  right axilla measures 15 mm short axis (image 14) compared to 9 mm  on prior. Adjacent rounded 20 mm lymph node (#8) compares to 13 mm  on prior. There are clustered small supraclavicular lymph nodes  which also appears slightly enlarged. Within the mediastinum small  paratracheal lymph nodes are similar. No pericardial fluid.  Esophagus is normal. Coronary artery stent is noted.  Review of the lung parenchyma demonstrates no new  suspicious  pulmonary nodules.  IMPRESSION:  1. Mild continued progressive enlargement of axillary  lymphadenopathy.  2. Stable supraclavicular and mediastinal adenopathy.  CT ABDOMEN AND PELVIS  Findings: Retroperitoneal lymphadenopathy is slightly progressed  compared to prior. For example conglomerate of nodes left of the  aorta measures 26 mm short axis compared to 22 mm on prior (image  71). Periportal lymph node measures 23 mm compared to 20 mm on  prior.  The spleen is massively enlarged with a calculated volume of 2362  ml compared to 1972 ml on prior.  Bilateral external iliac lymph nodes are increased in volume. Left  node measures 19mm short axis compared to 13 on prior. Inguinal  adenopathy is also increased.  The liver, liver is normal. Gallbladder is absent. The spleen,  adrenal glands, kidneys are normal.  The colon rectosigmoid colon are normal.  The bladder prostate gland normal. No pelvic fluid.  IMPRESSION:  1. Progressive increase in retroperitoneal, iliac, and inguinal  lymphadenopathy.  2. Interval increase in massive splenomegaly.     ASSESSMENT: 62 year old gentleman with  #1 CLL progressive Now receiving chemotherapy. His physical revealed decrease in his lymphadenopathy in the cervical and axillary area. Spleen is also improved. He is s/p cycle #3 of FCR . He did receive neulasta with cycle 3 of his chemotherapy due to profound neutropenia  #2 dental abscess resolved   #3 chronic pulmonary infection: He has been on prolonged course of oral antibiotics with now resolution of this.   #4 He is s/p IVIG dose 1 last week on 08/31/12, which he tolerated well. He tells me that his cough and productive sputum has resolved and he feels much better, we will continue IVIG monthly  PLAN:  #1 proceed with cycle 4 of FCR chemotherapy.  #2 he will receive day for Neulasta.  #3 he will return in one week's time for followup and IVIG  All questions were answered.  The patient knows to call the clinic with any problems, questions or concerns. We can certainly see the patient much sooner if necessary.  I spent 25 minutes counseling the patient face to face. The total time spent in the appointment was 30 minutes.  Drue Second, MD Medical/Oncology [facility] [phone]  beeper) [phone] (Office)

## 2012-09-26 ENCOUNTER — Other Ambulatory Visit (HOSPITAL_BASED_OUTPATIENT_CLINIC_OR_DEPARTMENT_OTHER): Payer: BC Managed Care – PPO | Admitting: Lab

## 2012-09-26 ENCOUNTER — Telehealth: Payer: Self-pay | Admitting: *Deleted

## 2012-09-26 ENCOUNTER — Ambulatory Visit (HOSPITAL_BASED_OUTPATIENT_CLINIC_OR_DEPARTMENT_OTHER): Payer: BC Managed Care – PPO

## 2012-09-26 ENCOUNTER — Ambulatory Visit (HOSPITAL_BASED_OUTPATIENT_CLINIC_OR_DEPARTMENT_OTHER): Payer: BC Managed Care – PPO | Admitting: Oncology

## 2012-09-26 ENCOUNTER — Other Ambulatory Visit: Payer: Self-pay | Admitting: Lab

## 2012-09-26 VITALS — BP 103/54 | HR 67 | Temp 97.2°F | Resp 18

## 2012-09-26 VITALS — BP 131/69 | HR 82 | Temp 98.0°F | Resp 20 | Ht 74.5 in | Wt 206.4 lb

## 2012-09-26 DIAGNOSIS — D709 Neutropenia, unspecified: Secondary | ICD-10-CM

## 2012-09-26 DIAGNOSIS — C9110 Chronic lymphocytic leukemia of B-cell type not having achieved remission: Secondary | ICD-10-CM

## 2012-09-26 LAB — COMPREHENSIVE METABOLIC PANEL (CC13)
AST: 19 U/L (ref 5–34)
Alkaline Phosphatase: 96 U/L (ref 40–150)
Glucose: 395 mg/dl — ABNORMAL HIGH (ref 70–140)
Sodium: 138 mEq/L (ref 136–145)
Total Bilirubin: 0.79 mg/dL (ref 0.20–1.20)
Total Protein: 5.8 g/dL — ABNORMAL LOW (ref 6.4–8.3)

## 2012-09-26 LAB — CBC WITH DIFFERENTIAL/PLATELET
BASO%: 0.3 % (ref 0.0–2.0)
Basophils Absolute: 0 10*3/uL (ref 0.0–0.1)
EOS%: 0.3 % (ref 0.0–7.0)
MCH: 36.6 pg — ABNORMAL HIGH (ref 27.2–33.4)
MCHC: 34.1 g/dL (ref 32.0–36.0)
MCV: 107.4 fL — ABNORMAL HIGH (ref 79.3–98.0)
MONO%: 7 % (ref 0.0–14.0)
RBC: 2.84 10*6/uL — ABNORMAL LOW (ref 4.20–5.82)
RDW: 17.1 % — ABNORMAL HIGH (ref 11.0–14.6)
lymph#: 0.2 10*3/uL — ABNORMAL LOW (ref 0.9–3.3)
nRBC: 0 % (ref 0–0)

## 2012-09-26 MED ORDER — SODIUM CHLORIDE 0.9 % IJ SOLN
10.0000 mL | INTRAMUSCULAR | Status: AC | PRN
  Administered 2012-09-26: 10 mL
  Filled 2012-09-26: qty 10

## 2012-09-26 MED ORDER — IMMUNE GLOBULIN (HUMAN) 5 GM/100ML IV SOLN
0.4000 g/kg | Freq: Once | INTRAVENOUS | Status: AC
  Administered 2012-09-26: 35 g via INTRAVENOUS
  Filled 2012-09-26: qty 100

## 2012-09-26 MED ORDER — SODIUM CHLORIDE 0.9 % IJ SOLN
10.0000 mL | INTRAMUSCULAR | Status: DC | PRN
  Filled 2012-09-26: qty 10

## 2012-09-26 MED ORDER — IMMUNE GLOBULIN (HUMAN) 10 GM/100ML IV SOLN: 0.5000 g/kg | Freq: Once | INTRAVENOUS | Status: DC

## 2012-09-26 MED ORDER — SODIUM CHLORIDE 0.9 % IJ SOLN
3.0000 mL | Freq: Once | INTRAMUSCULAR | Status: DC | PRN
  Filled 2012-09-26: qty 10

## 2012-09-26 MED ORDER — ACETAMINOPHEN 325 MG PO TABS
650.0000 mg | ORAL_TABLET | Freq: Once | ORAL | Status: AC
  Administered 2012-09-26: 650 mg via ORAL

## 2012-09-26 MED ORDER — CIPROFLOXACIN HCL 500 MG PO TABS: 500.0000 mg | ORAL_TABLET | Freq: Two times a day (BID) | ORAL | Status: DC

## 2012-09-26 MED ORDER — DIPHENHYDRAMINE HCL 25 MG PO TABS
25.0000 mg | ORAL_TABLET | Freq: Once | ORAL | Status: AC
  Administered 2012-09-26: 25 mg via ORAL
  Filled 2012-09-26: qty 1

## 2012-09-26 MED ORDER — HEPARIN SOD (PORK) LOCK FLUSH 100 UNIT/ML IV SOLN
250.0000 [IU] | Freq: Once | INTRAVENOUS | Status: DC | PRN
  Filled 2012-09-26: qty 5

## 2012-09-26 MED ORDER — HEPARIN SOD (PORK) LOCK FLUSH 100 UNIT/ML IV SOLN
500.0000 [IU] | INTRAVENOUS | Status: AC | PRN
  Administered 2012-09-26: 500 [IU]
  Filled 2012-09-26: qty 5

## 2012-09-26 NOTE — Patient Instructions (Addendum)
Proceed with IVIG today  We will see you back on 9/3 for follow up and FCR day 1 -3 with neulasta on 9/6  Please call with any problems or questions

## 2012-09-26 NOTE — Patient Instructions (Signed)

## 2012-09-26 NOTE — Telephone Encounter (Signed)
Per staff message and POF I have scheduled appts.  JMW  

## 2012-09-26 NOTE — Progress Notes (Signed)
OFFICE PROGRESS NOTE  CC  Ryan Copeland [address] [place] [address]  DIAGNOSIS: 62 year old gentleman with stage III CLL with progressive disease.   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 Patient began IVIG q. monthly  CURRENT THERAPY:  cycle #4 of FCR days 1-3   Ryan Copeland: Ryan Copeland 62 y.o. male returns for followup visit prior to chemotherapy ,he feels significantly better. His cough has resolved, no fevers or chills, no nausea or vomiting, he is not neutropenic, platelets are improved. Remainder of the 10 point review of systems is negative.  MEDICAL Copeland: Past Medical Copeland  Diagnosis Date  . 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 Hyperlipidemia   . Tobacco abuse   . PVD (peripheral vascular disease)   . 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 Diabetes mellitus     ALLERGIES:  is allergic to codeine.  MEDICATIONS:  Current Outpatient Prescriptions  Medication Sig Dispense Refill  . acyclovir (ZOVIRAX) 400 MG tablet Take 1 tablet (400 mg total) by mouth daily.  30 tablet  5  . allopurinol (ZYLOPRIM) 300 MG tablet Take 1 tablet (300 mg total) by mouth daily.  30 tablet  6  . ALPRAZolam (XANAX) 0.25 MG tablet Take 1 po q 6 hours prn for anxiety  90 tablet  1  . amoxicillin-clavulanate (AUGMENTIN) 875-125 MG per tablet Take 1 tablet by mouth 2 (two) times daily.  14 tablet  0  . aspirin 325 MG tablet Take 325 mg by mouth daily.        . B Complex Vitamins (VITAMIN B COMPLEX IJ) Inject as directed every 30 (thirty) days.      . ciprofloxacin (CIPRO) 500 MG tablet Take 1 tablet (500 mg total) by mouth 2 (two) times daily.  14 tablet  6  . cyanocobalamin (,VITAMIN B-12,) 1000 MCG/ML injection Inject 1 mL (1,000 mcg total) into the muscle every 30 (thirty) days.  3  mL  1  . dexamethasone (DECADRON) 4 MG tablet Take 2 tablets (8 mg total) by mouth 2 (two) times daily with a meal. Take daily starting the day after chemotherapy for 2 days. Take with food.  30 tablet  1  . diphenhydrAMINE (BENADRYL) 25 mg capsule Take 50 mg by mouth at bedtime as needed for itching or sleep.      Marland Kitchen esomeprazole (NEXIUM) 40 MG capsule Take 1 capsule (40 mg total) by mouth 2 (two) times daily.  180 capsule  3  . fluticasone (FLOVENT HFA) 110 MCG/ACT inhaler Inhale 1 puff into the lungs 2 (two) times daily.  1 Inhaler  12  . Ipratropium-Albuterol (COMBIVENT) 20-100 MCG/ACT AERS respimat Inhale 1 puff into the lungs every 6 (six) hours.  1 Inhaler  6  . lidocaine-prilocaine (EMLA) cream Apply topically as needed. Place cream on port site 1-2 hours prior to treatment.  30 g  2  . LORazepam (ATIVAN) 0.5 MG tablet Take 1 tablet (0.5 mg total) by mouth every 6 (six) hours as needed (Nausea or vomiting).  30 tablet  0  . metFORMIN (GLUCOPHAGE-XR) 500 MG 24 hr tablet Take 500 mg by mouth 2 (two) times daily.      . metoprolol succinate (TOPROL-XL) 50 MG 24 hr tablet Take 50 mg by mouth daily with breakfast. Take with or immediately following a  meal.      . nitroGLYCERIN (NITROSTAT) 0.4 MG SL tablet Place 1 tablet (0.4 mg total) under the tongue every 5 (five) minutes as needed.  25 tablet  12  . ondansetron (ZOFRAN) 8 MG tablet Take 1 tablet (8 mg total) by mouth 2 (two) times daily. Take two times a day starting the day after chemo for 2 days. Then take two times a day as needed for nausea or vomiting.  30 tablet  1  . oxyCODONE (OXY IR/ROXICODONE) 5 MG immediate release tablet Take 1 tablet (5 mg total) by mouth every 4 (four) hours as needed for pain.  30 tablet  0  . prasugrel (EFFIENT) 10 MG TABS Take 10 mg by mouth daily with breakfast.      . pregabalin (LYRICA) 75 MG capsule 1 cap three times daily and 2 caps at bedtime  150 capsule  6  . prochlorperazine (COMPAZINE) 10 MG tablet Take  1 tablet (10 mg total) by mouth every 6 (six) hours as needed (Nausea or vomiting).  30 tablet  1  . prochlorperazine (COMPAZINE) 25 MG suppository Place 1 suppository (25 mg total) rectally every 12 (twelve) hours as needed for nausea.  12 suppository  3  . sulfamethoxazole-trimethoprim (BACTRIM DS,SEPTRA DS) 800-160 MG per tablet Take 1 tablet by mouth 3 (three) times a week.  12 tablet  5  . tetrahydrozoline 0.05 % ophthalmic solution Place 1 drop into both eyes as needed. Red eyes      . topiramate (TOPAMAX) 25 MG capsule Take 1 capsule (25 mg total) by mouth 2 (two) times daily.  180 capsule  1   No current facility-administered medications for this visit.   Facility-Administered Medications Ordered in Other Visits  Medication Dose Route Frequency Provider Last Rate Last Dose  . sodium chloride 0.9 % injection 10 mL  10 mL Intracatheter PRN Victorino December, MD   10 mL at 08/22/12 1721    SURGICAL Copeland:  Past Surgical Copeland  Procedure Laterality Date  . Femoral stents    . Coronary stent placement  May 2011  . Cholecystectomy  2007    REVIEW OF SYSTEMS:   General: fatigue (-), night sweats (-), fever (-), pain (-) Lymph: palpable nodes (-) HEENT: vision changes (-), mucositis (-), gum bleeding (-), epistaxis (-) Cardiovascular: chest pain (-), palpitations (-) Pulmonary: shortness of breath (-), dyspnea on exertion (-), cough (-), hemoptysis (-) GI:  Early satiety (-), melena (-), dysphagia (-), nausea/vomiting (-), diarrhea (-) GU: dysuria (-), hematuria (-), incontinence (-) Musculoskeletal: joint swelling (-), joint pain (-), back pain (-) Neuro: weakness (-), numbness (-), headache (-), confusion (-) Skin: Rash (-), lesions (-), dryness (-) Psych: depression (-), suicidal/homicidal ideation (-), feeling of hopelessness (-)   PHYSICAL EXAMINATION:  BP 131/69  Pulse 82  Temp(Src) 98 F (36.7 C) (Oral)  Resp 20  Ht 6' 2.5" (1.892 m)  Wt 206 lb 6.4 oz (93.622 kg)   BMI 26.15 kg/m2 General: Patient is a well appearing male in no acute distress HEENT: PERRLA, sclerae anicteric no conjunctival pallor, MMM Neck: supple, no palpable adenopathy Lungs: clear to auscultation bilaterally, no wheezes, rhonchi, or rales Cardiovascular: regular rate rhythm, S1, S2, no murmurs, rubs or gallops Abdomen: Soft, non-tender, non-distended, normoactive bowel sounds, no HSM Extremities: warm and well perfused, no clubbing, cyanosis, or edema Skin: No rashes or lesions Neuro: Non-focal ECOG PERFORMANCE STATUS: 0 - Asymptomatic  LABORATORY DATA: Lab Results  Component Value Date  WBC 3.3* 09/26/2012   HGB 10.4* 09/26/2012   HCT 30.5* 09/26/2012   MCV 107.4* 09/26/2012   PLT 52* 09/26/2012      Chemistry      Component Value Date/Time   NA 138 09/26/2012 0814   NA 134* 06/01/2012 1145   K 4.2 09/26/2012 0814   K 4.6 06/01/2012 1145   CL 104 08/03/2012 1229   CL 103 06/01/2012 1145   CO2 21* 09/26/2012 0814   CO2 24 06/01/2012 1145   BUN 12.8 09/26/2012 0814   BUN 15 06/01/2012 1145   CREATININE 1.0 09/26/2012 0814   CREATININE 0.96 06/01/2012 1145      Component Value Date/Time   CALCIUM 8.9 09/26/2012 0814   CALCIUM 9.5 06/01/2012 1145   ALKPHOS 96 09/26/2012 0814   ALKPHOS 65 08/22/2011 0904   AST 19 09/26/2012 0814   AST 19 08/22/2011 0904   ALT 81* 09/26/2012 0814   ALT 27 08/22/2011 0904   BILITOT 0.79 09/26/2012 0814   BILITOT 0.5 08/22/2011 0904    FINAL DIAGNOSIS Diagnosis Bone Marrow, Aspirate,Biopsy, and Clot, right iliac bone - HYPERCELLULAR BONE MARROW WITH EXTENSIVE INVOLVEMENT BY CHRONIC LYMPHOCYTIC LEUKEMIA. PERIPHERAL BLOOD: - CHRONIC LYMPHOCYTIC LEUKEMIA. [name] MD Pathologist, Electronic Signature (Case signed 11/22/2011) GROSS AND MICROSCOPIC INFORMATION Specimen Clinical Information hx CLL [jl] Source Bone Marrow, Aspirate,Biopsy, and Clot, right iliac bone Microscopic LAB DATA: CBC performed on 11/18/11 shows: WBC 80.87 K/ul Neutrophils  5% HB 15.7 g/dl Lymphocytes 16% HCT 10.9 % Monocytes 1% MCV 96.0 fL Eosinophils 0% RDW 15.4 % Basophils 0% PLT 81 K/ul PERIPHERAL BLOOD SMEAR: The red blood cells display mild anisopoikilocytosis with minimal polychromasia. The white blood cells are markedly increased in number with lymphocytosis. The lymphocytes primarily consist of small lymphoid cells with generally high nuclear cytoplasmic ratio, dense chromatin with block type clumping, and inconspicuous nucleoli. There are numerous smudge cells in the 1 of 3 FINAL for HRISHIKESH, HOEG [phone]) Microscopic(continued) background. A significant prolymphocytic component is not present. The platelets are decreased in number. BONE MARROW ASPIRATE: Erythroid precursors: Relatively decreased with progressive maturation. Granulocytic precursors: Relatively decreased with progressive maturation. Megakaryocytes: Scattered forms are seen displaying normal morphology. Lymphocytes/plasma cells: The lymphocytes are markedly increased in number representing 95% of all cells and consist of small lymphoid cells with high nuclear cytoplasmic ratio, dense chromatin and inconspicuous nucleoli. A significant prolymphocytic or large lymphoid cell component is not present. Significant plasma cell aggregates are not present. TOUCH PREPARATIONS: Predominance of small lymphoid cells. CLOT and BIOPSY: The sections show 90% cellularity with extensive infiltration of the medullary space by numerous aggregates, interstitial infiltrates and diffuse sheets of primarily small lymphoid cells displaying high nuclear cytoplasmic ratio, dense chromatin and small to inconspicuous nucleoli. Scattered small proliferation centers are seen. Myeloid hematopoiesis appears decreased in the background. Immunohistochemical stains for CD20, CD79a, CD3, CD5, CD10 and cyclin D1 were performed with appropriate controls. The lymphoid population is stains positively ffor CD79a (B cell  marker) with patchy weaker positivity for CD20. There is patchy weak coexpression of CD5. No CD10 or cyclin D1 expression is identified. IRON STAIN: Iron stains are performed on a bone marrow aspirate smear and section of clot. The controls stained appropriately. Storage Iron: Decreased. Ringed Sideroblasts: Absent. ADDITIONAL DATA / TESTING: Specimen was sent for cytogenetic analysis and a separate report will follow. Flow cytometric analysis of the lymphoid population ([id number]) shows a major B cell population expressing pan B cell antigens including CD20 and CD23 associated with  coexpression of CD5. This population shows extremely dim/negative staining for surface immunoglobulin light chains. (BNS:gt, 11/21/11) Specimen Table Bone Marrow count performed on 500 cells shows: Blasts: 0% Myeloid 3% Promyelocyts: 0% Myelocytes: 0% Erythroid 2% Metamyelocyts: 1% Bands: 1% Lymphocytes: 95% Neutrophils: 1% Eosinophils: 0% Plasma Cells: 0% Basophils: 0% Monocytes: 0% M:E ratio: 1.5 Gross Received in [facility] are tissue fragments which aggregate 0.4 x 0.4 x 0.1 cm. The specimen is submitted in toto. Received in a separate container in [facility] are two cores of bone measuring 2.2 and 2.8 cm in length and each 0.2 cm in diameter. The specimen is submitted in toto following decalcification. (GP:eps 11/18/11) 2 of   RADIOGRAPHIC STUDIES: CT CHEST  Findings: Bilateral axillary lymphadenopathy is slightly  progressed compared to prior. For example, index node in the  right axilla measures 15 mm short axis (image 14) compared to 9 mm  on prior. Adjacent rounded 20 mm lymph node (#8) compares to 13 mm  on prior. There are clustered small supraclavicular lymph nodes  which also appears slightly enlarged. Within the mediastinum small  paratracheal lymph nodes are similar. No pericardial fluid.  Esophagus is normal. Coronary artery stent is noted.  Review of the lung parenchyma demonstrates no new  suspicious  pulmonary nodules.  IMPRESSION:  1. Mild continued progressive enlargement of axillary  lymphadenopathy.  2. Stable supraclavicular and mediastinal adenopathy.  CT ABDOMEN AND PELVIS  Findings: Retroperitoneal lymphadenopathy is slightly progressed  compared to prior. For example conglomerate of nodes left of the  aorta measures 26 mm short axis compared to 22 mm on prior (image  71). Periportal lymph node measures 23 mm compared to 20 mm on  prior.  The spleen is massively enlarged with a calculated volume of 2362  ml compared to 1972 ml on prior.  Bilateral external iliac lymph nodes are increased in volume. Left  node measures 19mm short axis compared to 13 on prior. Inguinal  adenopathy is also increased.  The liver, liver is normal. Gallbladder is absent. The spleen,  adrenal glands, kidneys are normal.  The colon rectosigmoid colon are normal.  The bladder prostate gland normal. No pelvic fluid.  IMPRESSION:  1. Progressive increase in retroperitoneal, iliac, and inguinal  lymphadenopathy.  2. Ryan increase in massive splenomegaly.     ASSESSMENT: 62 year old gentleman with  #1 CLL progressive Now receiving chemotherapy. His physical revealed decrease in his lymphadenopathy in the cervical and axillary area. Spleen is also improved. He is s/p cycle #3 of FCR . He did receive neulasta with cycle 3 of his chemotherapy due to profound neutropenia  #2 dental abscess resolved   #3 chronic pulmonary infection: He has been on prolonged course of oral antibiotics with now resolution of this.   #4 He is s/p IVIG dose 1 last week on 08/31/12, which he tolerated well. He tells me that his cough and productive sputum has resolved and he feels much better, we will continue IVIG monthly  PLAN:  #1 Tolerated cycle 4 of FCR well  #2 patient is now here for monthly IVIG.  #3 his counts are stable however they do remain low.  #4 he has not had any intercurrent  illnesses.  #5 he will return in 2 weeks' time for cycle 5 of FCR.   All questions were answered. The patient knows to call the clinic with any problems, questions or concerns. We can certainly see the patient much sooner if necessary.  I spent 25 minutes counseling the patient face  to face. The total time spent in the appointment was 30 minutes.  Drue Second, MD Medical/Oncology [facility] [phone] (beeper) [phone] (Office)

## 2012-10-10 ENCOUNTER — Other Ambulatory Visit: Payer: Self-pay

## 2012-10-10 MED ORDER — METOPROLOL SUCCINATE ER 50 MG PO TB24: 50.0000 mg | ORAL_TABLET | Freq: Every day | ORAL | Status: DC

## 2012-10-10 MED ORDER — PREGABALIN 75 MG PO CAPS: ORAL_CAPSULE | ORAL | Status: DC

## 2012-10-17 ENCOUNTER — Other Ambulatory Visit (HOSPITAL_BASED_OUTPATIENT_CLINIC_OR_DEPARTMENT_OTHER): Payer: BC Managed Care – PPO

## 2012-10-17 ENCOUNTER — Ambulatory Visit (HOSPITAL_BASED_OUTPATIENT_CLINIC_OR_DEPARTMENT_OTHER): Payer: BC Managed Care – PPO

## 2012-10-17 ENCOUNTER — Ambulatory Visit (HOSPITAL_BASED_OUTPATIENT_CLINIC_OR_DEPARTMENT_OTHER): Payer: BC Managed Care – PPO | Admitting: Oncology

## 2012-10-17 VITALS — BP 112/71 | HR 61 | Temp 98.3°F | Resp 18

## 2012-10-17 VITALS — BP 127/69 | HR 76 | Temp 97.7°F | Resp 20 | Ht 72.0 in | Wt 204.3 lb

## 2012-10-17 DIAGNOSIS — C9110 Chronic lymphocytic leukemia of B-cell type not having achieved remission: Secondary | ICD-10-CM

## 2012-10-17 DIAGNOSIS — Z5111 Encounter for antineoplastic chemotherapy: Secondary | ICD-10-CM

## 2012-10-17 DIAGNOSIS — J984 Other disorders of lung: Secondary | ICD-10-CM

## 2012-10-17 DIAGNOSIS — F17200 Nicotine dependence, unspecified, uncomplicated: Secondary | ICD-10-CM

## 2012-10-17 DIAGNOSIS — G609 Hereditary and idiopathic neuropathy, unspecified: Secondary | ICD-10-CM

## 2012-10-17 DIAGNOSIS — G619 Inflammatory polyneuropathy, unspecified: Secondary | ICD-10-CM

## 2012-10-17 LAB — COMPREHENSIVE METABOLIC PANEL (CC13)
AST: 24 U/L (ref 5–34)
Albumin: 3.6 g/dL (ref 3.5–5.0)
Alkaline Phosphatase: 93 U/L (ref 40–150)
Potassium: 4.1 mEq/L (ref 3.5–5.1)
Sodium: 141 mEq/L (ref 136–145)
Total Protein: 6.2 g/dL — ABNORMAL LOW (ref 6.4–8.3)

## 2012-10-17 LAB — CBC WITH DIFFERENTIAL/PLATELET
BASO%: 0.2 % (ref 0.0–2.0)
Basophils Absolute: 0 10*3/uL (ref 0.0–0.1)
EOS%: 0.5 % (ref 0.0–7.0)
HGB: 13.3 g/dL (ref 13.0–17.1)
MCH: 36.2 pg — ABNORMAL HIGH (ref 27.2–33.4)
MCHC: 34.1 g/dL (ref 32.0–36.0)
RDW: 14.9 % — ABNORMAL HIGH (ref 11.0–14.6)
lymph#: 0.3 10*3/uL — ABNORMAL LOW (ref 0.9–3.3)

## 2012-10-17 MED ORDER — SODIUM CHLORIDE 0.9 % IV SOLN
250.0000 mg/m2 | Freq: Once | INTRAVENOUS | Status: AC
  Administered 2012-10-17: 560 mg via INTRAVENOUS
  Filled 2012-10-17: qty 28

## 2012-10-17 MED ORDER — DIPHENHYDRAMINE HCL 25 MG PO CAPS
50.0000 mg | ORAL_CAPSULE | Freq: Once | ORAL | Status: AC
  Administered 2012-10-17: 50 mg via ORAL

## 2012-10-17 MED ORDER — HEPARIN SOD (PORK) LOCK FLUSH 100 UNIT/ML IV SOLN
500.0000 [IU] | Freq: Once | INTRAVENOUS | Status: AC | PRN
  Administered 2012-10-17: 500 [IU]
  Filled 2012-10-17: qty 5

## 2012-10-17 MED ORDER — SODIUM CHLORIDE 0.9 % IV SOLN
Freq: Once | INTRAVENOUS | Status: AC
  Administered 2012-10-17: 10:00:00 via INTRAVENOUS

## 2012-10-17 MED ORDER — SODIUM CHLORIDE 0.9 % IV SOLN
25.0000 mg/m2 | Freq: Once | INTRAVENOUS | Status: AC
  Administered 2012-10-17: 57.5 mg via INTRAVENOUS
  Filled 2012-10-17: qty 2.3

## 2012-10-17 MED ORDER — ONDANSETRON 8 MG/50ML IVPB (CHCC)
8.0000 mg | Freq: Once | INTRAVENOUS | Status: AC
  Administered 2012-10-17: 8 mg via INTRAVENOUS

## 2012-10-17 MED ORDER — SODIUM CHLORIDE 0.9 % IJ SOLN
10.0000 mL | INTRAMUSCULAR | Status: DC | PRN
Start: 2012-10-17 — End: 2012-10-17
  Administered 2012-10-17: 10 mL
  Filled 2012-10-17: qty 10

## 2012-10-17 MED ORDER — SODIUM CHLORIDE 0.9 % IV SOLN
500.0000 mg/m2 | Freq: Once | INTRAVENOUS | Status: AC
  Administered 2012-10-17: 1100 mg via INTRAVENOUS
  Filled 2012-10-17: qty 110

## 2012-10-17 MED ORDER — ACETAMINOPHEN 325 MG PO TABS
650.0000 mg | ORAL_TABLET | Freq: Once | ORAL | Status: AC
  Administered 2012-10-17: 650 mg via ORAL

## 2012-10-17 NOTE — Progress Notes (Signed)
OFFICE PROGRESS NOTE  CC  Ryan Copeland [address] [place] [address]  DIAGNOSIS: 62 year old gentleman with stage III CLL with progressive disease.   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 Patient began IVIG q. monthly  CURRENT THERAPY:  cycle #5 of FCR days 1-3   INTERVAL HISTORY: Ryan Copeland 62 y.o. male returns for followup visit prior to chemotherapy ,he feels significantly better. His cough has resolved, no fevers or chills, no nausea or vomiting, he is not neutropenic, platelets are improved. Remainder of the 10 point review of systems is negative.  MEDICAL HISTORY: Past Medical History  Diagnosis Date  . 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 Hyperlipidemia   . Tobacco abuse   . PVD (peripheral vascular disease)   . 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 Diabetes mellitus     ALLERGIES:  is allergic to codeine.  MEDICATIONS:  Current Outpatient Prescriptions  Medication Sig Dispense Refill  . acyclovir (ZOVIRAX) 400 MG tablet Take 1 tablet (400 mg total) by mouth daily.  30 tablet  5  . allopurinol (ZYLOPRIM) 300 MG tablet Take 1 tablet (300 mg total) by mouth daily.  30 tablet  6  . ALPRAZolam (XANAX) 0.25 MG tablet Take 1 po q 6 hours prn for anxiety  90 tablet  1  . amoxicillin-clavulanate (AUGMENTIN) 875-125 MG per tablet Take 1 tablet by mouth 2 (two) times daily.  14 tablet  0  . aspirin 325 MG tablet Take 325 mg by mouth daily.        . B Complex Vitamins (VITAMIN B COMPLEX IJ) Inject as directed every 30 (thirty) days.      . ciprofloxacin (CIPRO) 500 MG tablet Take 1 tablet (500 mg total) by mouth 2 (two) times daily.  14 tablet  6  . cyanocobalamin (,VITAMIN B-12,) 1000 MCG/ML injection Inject 1 mL (1,000 mcg total) into the muscle every 30 (thirty) days.  3  mL  1  . dexamethasone (DECADRON) 4 MG tablet Take 2 tablets (8 mg total) by mouth 2 (two) times daily with a meal. Take daily starting the day after chemotherapy for 2 days. Take with food.  30 tablet  1  . diphenhydrAMINE (BENADRYL) 25 mg capsule Take 50 mg by mouth at bedtime as needed for itching or sleep.      Marland Kitchen esomeprazole (NEXIUM) 40 MG capsule Take 1 capsule (40 mg total) by mouth 2 (two) times daily.  180 capsule  3  . fluticasone (FLOVENT HFA) 110 MCG/ACT inhaler Inhale 1 puff into the lungs 2 (two) times daily.  1 Inhaler  12  . Ipratropium-Albuterol (COMBIVENT) 20-100 MCG/ACT AERS respimat Inhale 1 puff into the lungs every 6 (six) hours.  1 Inhaler  6  . lidocaine-prilocaine (EMLA) cream Apply topically as needed. Place cream on port site 1-2 hours prior to treatment.  30 g  2  . metFORMIN (GLUCOPHAGE-XR) 500 MG 24 hr tablet Take 500 mg by mouth 2 (two) times daily.      . metoprolol succinate (TOPROL-XL) 50 MG 24 hr tablet Take 1 tablet (50 mg total) by mouth daily with breakfast. Take with or immediately following a meal.  90 tablet  0  . pregabalin (LYRICA) 75 MG capsule 1 cap three times daily and 2 caps at bedtime  150 capsule  6  . sulfamethoxazole-trimethoprim (BACTRIM DS,SEPTRA DS) 800-160 MG per tablet Take 1 tablet by mouth 3 (three) times a week.  12 tablet  5  . tetrahydrozoline 0.05 % ophthalmic solution Place 1 drop into both eyes as needed. Red eyes      . topiramate (TOPAMAX) 25 MG capsule Take 1 capsule (25 mg total) by mouth 2 (two) times daily.  180 capsule  1  . LORazepam (ATIVAN) 0.5 MG tablet Take 1 tablet (0.5 mg total) by mouth every 6 (six) hours as needed (Nausea or vomiting).  30 tablet  0  . nitroGLYCERIN (NITROSTAT) 0.4 MG SL tablet Place 1 tablet (0.4 mg total) under the tongue every 5 (five) minutes as needed.  25 tablet  12  . ondansetron (ZOFRAN) 8 MG tablet Take 1 tablet (8 mg total) by mouth 2 (two) times daily. Take two times a day starting the day  after chemo for 2 days. Then take two times a day as needed for nausea or vomiting.  30 tablet  1  . oxyCODONE (OXY IR/ROXICODONE) 5 MG immediate release tablet Take 1 tablet (5 mg total) by mouth every 4 (four) hours as needed for pain.  30 tablet  0  . prasugrel (EFFIENT) 10 MG TABS Take 10 mg by mouth daily with breakfast.      . prochlorperazine (COMPAZINE) 10 MG tablet Take 1 tablet (10 mg total) by mouth every 6 (six) hours as needed (Nausea or vomiting).  30 tablet  1  . prochlorperazine (COMPAZINE) 25 MG suppository Place 1 suppository (25 mg total) rectally every 12 (twelve) hours as needed for nausea.  12 suppository  3   No current facility-administered medications for this visit.   Facility-Administered Medications Ordered in Other Visits  Medication Dose Route Frequency Provider Last Rate Last Dose  . sodium chloride 0.9 % injection 10 mL  10 mL Intracatheter PRN Victorino December, MD   10 mL at 08/22/12 1721    SURGICAL HISTORY:  Past Surgical History  Procedure Laterality Date  . Femoral stents    . Coronary stent placement  May 2011  . Cholecystectomy  2007    REVIEW OF SYSTEMS:   General: fatigue (-), night sweats (-), fever (-), pain (-) Lymph: palpable nodes (-) HEENT: vision changes (-), mucositis (-), gum bleeding (-), epistaxis (-) Cardiovascular: chest pain (-), palpitations (-) Pulmonary: shortness of breath (-), dyspnea on exertion (-), cough (-), hemoptysis (-) GI:  Early satiety (-), melena (-), dysphagia (-), nausea/vomiting (-), diarrhea (-) GU: dysuria (-), hematuria (-), incontinence (-) Musculoskeletal: joint swelling (-), joint pain (-), back pain (-) Neuro: weakness (-), numbness (-), headache (-), confusion (-) Skin: Rash (-), lesions (-), dryness (-) Psych: depression (-), suicidal/homicidal ideation (-), feeling of hopelessness (-)   PHYSICAL EXAMINATION:  BP 127/69  Pulse 76  Temp(Src) 97.7 F (36.5 C) (Oral)  Resp 20  Ht 6' (1.829 m)  Wt  204 lb 4.8 oz (92.67 kg)  BMI 27.7 kg/m2 General: Patient is a well appearing male in no acute distress HEENT: PERRLA, sclerae anicteric no conjunctival pallor, MMM Neck: supple, no palpable adenopathy Lungs: clear to auscultation bilaterally, no wheezes, rhonchi, or rales Cardiovascular: regular rate rhythm, S1, S2, no murmurs, rubs or gallops Abdomen: Soft, non-tender, non-distended, normoactive bowel sounds, no HSM Extremities: warm and well perfused, no clubbing, cyanosis, or edema Skin: No rashes or lesions Neuro: Non-focal ECOG PERFORMANCE STATUS: 0 - Asymptomatic  LABORATORY DATA: Lab Results  Component Value Date  WBC 4.1 10/17/2012   HGB 13.3 10/17/2012   HCT 39.0 10/17/2012   MCV 106.3* 10/17/2012   PLT 61* 10/17/2012      Chemistry      Component Value Date/Time   NA 138 09/26/2012 0814   NA 134* 06/01/2012 1145   K 4.2 09/26/2012 0814   K 4.6 06/01/2012 1145   CL 104 08/03/2012 1229   CL 103 06/01/2012 1145   CO2 21* 09/26/2012 0814   CO2 24 06/01/2012 1145   BUN 12.8 09/26/2012 0814   BUN 15 06/01/2012 1145   CREATININE 1.0 09/26/2012 0814   CREATININE 0.96 06/01/2012 1145      Component Value Date/Time   CALCIUM 8.9 09/26/2012 0814   CALCIUM 9.5 06/01/2012 1145   ALKPHOS 96 09/26/2012 0814   ALKPHOS 65 08/22/2011 0904   AST 19 09/26/2012 0814   AST 19 08/22/2011 0904   ALT 81* 09/26/2012 0814   ALT 27 08/22/2011 0904   BILITOT 0.79 09/26/2012 0814   BILITOT 0.5 08/22/2011 0904    FINAL DIAGNOSIS Diagnosis Bone Marrow, Aspirate,Biopsy, and Clot, right iliac bone - HYPERCELLULAR BONE MARROW WITH EXTENSIVE INVOLVEMENT BY CHRONIC LYMPHOCYTIC LEUKEMIA. PERIPHERAL BLOOD: - CHRONIC LYMPHOCYTIC LEUKEMIA. [name] MD Pathologist, Electronic Signature (Case signed 11/22/2011) GROSS AND MICROSCOPIC INFORMATION Specimen Clinical Information hx CLL [jl] Source Bone Marrow, Aspirate,Biopsy, and Clot, right iliac bone Microscopic LAB DATA: CBC performed on 11/18/11 shows: WBC 80.87  K/ul Neutrophils 5% HB 15.7 g/dl Lymphocytes 16% HCT 10.9 % Monocytes 1% MCV 96.0 fL Eosinophils 0% RDW 15.4 % Basophils 0% PLT 81 K/ul PERIPHERAL BLOOD SMEAR: The red blood cells display mild anisopoikilocytosis with minimal polychromasia. The white blood cells are markedly increased in number with lymphocytosis. The lymphocytes primarily consist of small lymphoid cells with generally high nuclear cytoplasmic ratio, dense chromatin with block type clumping, and inconspicuous nucleoli. There are numerous smudge cells in the 1 of 3 FINAL for KAMDON, REISIG [phone]) Microscopic(continued) background. A significant prolymphocytic component is not present. The platelets are decreased in number. BONE MARROW ASPIRATE: Erythroid precursors: Relatively decreased with progressive maturation. Granulocytic precursors: Relatively decreased with progressive maturation. Megakaryocytes: Scattered forms are seen displaying normal morphology. Lymphocytes/plasma cells: The lymphocytes are markedly increased in number representing 95% of all cells and consist of small lymphoid cells with high nuclear cytoplasmic ratio, dense chromatin and inconspicuous nucleoli. A significant prolymphocytic or large lymphoid cell component is not present. Significant plasma cell aggregates are not present. TOUCH PREPARATIONS: Predominance of small lymphoid cells. CLOT and BIOPSY: The sections show 90% cellularity with extensive infiltration of the medullary space by numerous aggregates, interstitial infiltrates and diffuse sheets of primarily small lymphoid cells displaying high nuclear cytoplasmic ratio, dense chromatin and small to inconspicuous nucleoli. Scattered small proliferation centers are seen. Myeloid hematopoiesis appears decreased in the background. Immunohistochemical stains for CD20, CD79a, CD3, CD5, CD10 and cyclin D1 were performed with appropriate controls. The lymphoid population is stains positively  ffor CD79a (B cell marker) with patchy weaker positivity for CD20. There is patchy weak coexpression of CD5. No CD10 or cyclin D1 expression is identified. IRON STAIN: Iron stains are performed on a bone marrow aspirate smear and section of clot. The controls stained appropriately. Storage Iron: Decreased. Ringed Sideroblasts: Absent. ADDITIONAL DATA / TESTING: Specimen was sent for cytogenetic analysis and a separate report will follow. Flow cytometric analysis of the lymphoid population ([id number]) shows a major B cell population expressing pan B cell antigens including CD20 and CD23 associated with  coexpression of CD5. This population shows extremely dim/negative staining for surface immunoglobulin light chains. (BNS:gt, 11/21/11) Specimen Table Bone Marrow count performed on 500 cells shows: Blasts: 0% Myeloid 3% Promyelocyts: 0% Myelocytes: 0% Erythroid 2% Metamyelocyts: 1% Bands: 1% Lymphocytes: 95% Neutrophils: 1% Eosinophils: 0% Plasma Cells: 0% Basophils: 0% Monocytes: 0% M:E ratio: 1.5 Gross Received in [facility] are tissue fragments which aggregate 0.4 x 0.4 x 0.1 cm. The specimen is submitted in toto. Received in a separate container in [facility] are two cores of bone measuring 2.2 and 2.8 cm in length and each 0.2 cm in diameter. The specimen is submitted in toto following decalcification. (GP:eps 11/18/11) 2 of   RADIOGRAPHIC STUDIES: CT CHEST  Findings: Bilateral axillary lymphadenopathy is slightly  progressed compared to prior. For example, index node in the  right axilla measures 15 mm short axis (image 14) compared to 9 mm  on prior. Adjacent rounded 20 mm lymph node (#8) compares to 13 mm  on prior. There are clustered small supraclavicular lymph nodes  which also appears slightly enlarged. Within the mediastinum small  paratracheal lymph nodes are similar. No pericardial fluid.  Esophagus is normal. Coronary artery stent is noted.  Review of the lung parenchyma  demonstrates no new suspicious  pulmonary nodules.  IMPRESSION:  1. Mild continued progressive enlargement of axillary  lymphadenopathy.  2. Stable supraclavicular and mediastinal adenopathy.  CT ABDOMEN AND PELVIS  Findings: Retroperitoneal lymphadenopathy is slightly progressed  compared to prior. For example conglomerate of nodes left of the  aorta measures 26 mm short axis compared to 22 mm on prior (image  71). Periportal lymph node measures 23 mm compared to 20 mm on  prior.  The spleen is massively enlarged with a calculated volume of 2362  ml compared to 1972 ml on prior.  Bilateral external iliac lymph nodes are increased in volume. Left  node measures 19mm short axis compared to 13 on prior. Inguinal  adenopathy is also increased.  The liver, liver is normal. Gallbladder is absent. The spleen,  adrenal glands, kidneys are normal.  The colon rectosigmoid colon are normal.  The bladder prostate gland normal. No pelvic fluid.  IMPRESSION:  1. Progressive increase in retroperitoneal, iliac, and inguinal  lymphadenopathy.  2. Interval increase in massive splenomegaly.     ASSESSMENT: 62 year old gentleman with  #1 CLL progressive Now receiving chemotherapy. His physical revealed decrease in his lymphadenopathy in the cervical and axillary area. Spleen is also improved. He is s/p cycle #3 of FCR . He did receive neulasta with cycle 3 of his chemotherapy due to profound neutropenia  #2 dental abscess resolved   #3 chronic pulmonary infection: He has been on prolonged course of oral antibiotics with now resolution of this.   #4 He is s/p IVIG dose 1 last week on 08/31/12, which he tolerated well. He tells me that his cough and productive sputum has resolved and he feels much better, we will continue IVIG monthly  #5 patient and I again discussed smoking cessation I gave him a prescription for Chantix. He would like to start using these again to try to quit smoking. He seems to  be quite motivated at this time  PLAN:  #1patient will proceed with cycle #5 of FCR.  #2 I will see him back in one week's time for which time he will receive IVIG as well.  #3 patient will like to postpone him cycle 6 of his chemotherapy to 11/14/2012 since he has a trip coming up  All questions were answered. The patient knows to call the clinic with any problems, questions or concerns. We can certainly see the patient much sooner if necessary.  I spent 25 minutes counseling the patient face to face. The total time spent in the appointment was 30 minutes.  Drue Second, MD Medical/Oncology [facility] [phone] (beeper) [phone] (Office)

## 2012-10-17 NOTE — Patient Instructions (Addendum)
Proceed with cycle 5 of FCR today  Injection on 9/6  IVIG and follow up with KK on 10/24/12

## 2012-10-17 NOTE — Patient Instructions (Addendum)
[facility] Discharge Instructions for Patients Receiving Chemotherapy  Today you received the following chemotherapy agents Rituxan, Fludara, Cytoxan  To help prevent nausea and vomiting after your treatment, we encourage you to take your nausea medication as needed   If you develop nausea and vomiting that is not controlled by your nausea medication, call the clinic.   BELOW ARE SYMPTOMS THAT SHOULD BE REPORTED IMMEDIATELY:  *FEVER GREATER THAN 100.5 F  *CHILLS WITH OR WITHOUT FEVER  NAUSEA AND VOMITING THAT IS NOT CONTROLLED WITH YOUR NAUSEA MEDICATION  *UNUSUAL SHORTNESS OF BREATH  *UNUSUAL BRUISING OR BLEEDING  TENDERNESS IN MOUTH AND THROAT WITH OR WITHOUT PRESENCE OF ULCERS  *URINARY PROBLEMS  *BOWEL PROBLEMS  UNUSUAL RASH Items with * indicate a potential emergency and should be followed up as soon as possible.  Feel free to call the clinic you have any questions or concerns. The clinic phone number is [phone].

## 2012-10-18 ENCOUNTER — Telehealth: Payer: Self-pay | Admitting: *Deleted

## 2012-10-18 ENCOUNTER — Ambulatory Visit (HOSPITAL_BASED_OUTPATIENT_CLINIC_OR_DEPARTMENT_OTHER): Payer: BC Managed Care – PPO

## 2012-10-18 ENCOUNTER — Telehealth: Payer: Self-pay | Admitting: Oncology

## 2012-10-18 VITALS — BP 109/55 | HR 68 | Temp 98.0°F | Resp 18

## 2012-10-18 DIAGNOSIS — C9110 Chronic lymphocytic leukemia of B-cell type not having achieved remission: Secondary | ICD-10-CM

## 2012-10-18 DIAGNOSIS — Z5111 Encounter for antineoplastic chemotherapy: Secondary | ICD-10-CM

## 2012-10-18 DIAGNOSIS — C9112 Chronic lymphocytic leukemia of B-cell type in relapse: Secondary | ICD-10-CM

## 2012-10-18 MED ORDER — SODIUM CHLORIDE 0.9 % IV SOLN
Freq: Once | INTRAVENOUS | Status: AC
  Administered 2012-10-18: 15:00:00 via INTRAVENOUS

## 2012-10-18 MED ORDER — ONDANSETRON 8 MG/50ML IVPB (CHCC)
8.0000 mg | Freq: Once | INTRAVENOUS | Status: AC
  Administered 2012-10-18: 8 mg via INTRAVENOUS

## 2012-10-18 MED ORDER — SODIUM CHLORIDE 0.9 % IV SOLN
25.0000 mg/m2 | Freq: Once | INTRAVENOUS | Status: AC
  Administered 2012-10-18: 57.5 mg via INTRAVENOUS
  Filled 2012-10-18: qty 2.3

## 2012-10-18 MED ORDER — SODIUM CHLORIDE 0.9 % IJ SOLN
10.0000 mL | INTRAMUSCULAR | Status: DC | PRN
  Administered 2012-10-18: 10 mL
  Filled 2012-10-18: qty 10

## 2012-10-18 MED ORDER — SODIUM CHLORIDE 0.9 % IV SOLN
250.0000 mg/m2 | Freq: Once | INTRAVENOUS | Status: AC
  Administered 2012-10-18: 560 mg via INTRAVENOUS
  Filled 2012-10-18: qty 28

## 2012-10-18 MED ORDER — HEPARIN SOD (PORK) LOCK FLUSH 100 UNIT/ML IV SOLN
500.0000 [IU] | Freq: Once | INTRAVENOUS | Status: AC | PRN
  Administered 2012-10-18: 500 [IU]
  Filled 2012-10-18: qty 5

## 2012-10-18 NOTE — Patient Instructions (Addendum)
[facility] Discharge Instructions for Patients Receiving Chemotherapy  Today you received the following chemotherapy agents: Fludara, Cytoxan   To help prevent nausea and vomiting after your treatment, we encourage you to take your nausea medication as directed by your physician.   If you develop nausea and vomiting that is not controlled by your nausea medication, call the clinic.   BELOW ARE SYMPTOMS THAT SHOULD BE REPORTED IMMEDIATELY:  *FEVER GREATER THAN 100.5 F  *CHILLS WITH OR WITHOUT FEVER  NAUSEA AND VOMITING THAT IS NOT CONTROLLED WITH YOUR NAUSEA MEDICATION  *UNUSUAL SHORTNESS OF BREATH  *UNUSUAL BRUISING OR BLEEDING  TENDERNESS IN MOUTH AND THROAT WITH OR WITHOUT PRESENCE OF ULCERS  *URINARY PROBLEMS  *BOWEL PROBLEMS  UNUSUAL RASH Items with * indicate a potential emergency and should be followed up as soon as possible.  Feel free to call the clinic you have any questions or concerns. The clinic phone number is [phone].

## 2012-10-18 NOTE — Telephone Encounter (Signed)
Per staff message and POF I have scheduled appts.  JMW  

## 2012-10-19 ENCOUNTER — Encounter: Payer: Self-pay | Admitting: Cardiology

## 2012-10-19 ENCOUNTER — Ambulatory Visit (HOSPITAL_BASED_OUTPATIENT_CLINIC_OR_DEPARTMENT_OTHER): Payer: BC Managed Care – PPO

## 2012-10-19 DIAGNOSIS — C9110 Chronic lymphocytic leukemia of B-cell type not having achieved remission: Secondary | ICD-10-CM

## 2012-10-19 DIAGNOSIS — Z5111 Encounter for antineoplastic chemotherapy: Secondary | ICD-10-CM

## 2012-10-19 MED ORDER — ONDANSETRON 8 MG/50ML IVPB (CHCC)
8.0000 mg | Freq: Once | INTRAVENOUS | Status: AC
  Administered 2012-10-19: 8 mg via INTRAVENOUS

## 2012-10-19 MED ORDER — SODIUM CHLORIDE 0.9 % IV SOLN
250.0000 mg/m2 | Freq: Once | INTRAVENOUS | Status: AC
  Administered 2012-10-19: 560 mg via INTRAVENOUS
  Filled 2012-10-19: qty 28

## 2012-10-19 MED ORDER — HEPARIN SOD (PORK) LOCK FLUSH 100 UNIT/ML IV SOLN
500.0000 [IU] | Freq: Once | INTRAVENOUS | Status: AC | PRN
Start: 2012-10-19 — End: 2012-10-19
  Administered 2012-10-19: 500 [IU]
  Filled 2012-10-19: qty 5

## 2012-10-19 MED ORDER — SODIUM CHLORIDE 0.9 % IV SOLN
Freq: Once | INTRAVENOUS | Status: AC
  Administered 2012-10-19: 13:00:00 via INTRAVENOUS

## 2012-10-19 MED ORDER — ONDANSETRON 8 MG/NS 50 ML IVPB
INTRAVENOUS | Status: AC
  Filled 2012-10-19: qty 8

## 2012-10-19 MED ORDER — SODIUM CHLORIDE 0.9 % IV SOLN
25.0000 mg/m2 | Freq: Once | INTRAVENOUS | Status: AC
  Administered 2012-10-19: 57.5 mg via INTRAVENOUS
  Filled 2012-10-19: qty 2.3

## 2012-10-19 MED ORDER — SODIUM CHLORIDE 0.9 % IJ SOLN
10.0000 mL | INTRAMUSCULAR | Status: DC | PRN
  Administered 2012-10-19: 10 mL
  Filled 2012-10-19: qty 10

## 2012-10-19 NOTE — Patient Instructions (Addendum)
[facility] Discharge Instructions for Patients Receiving Chemotherapy  Today you received the following chemotherapy agents: fludara, cytoxan  To help prevent nausea and vomiting after your treatment, we encourage you to take your nausea medication.  Take it as often as prescribed.     If you develop nausea and vomiting that is not controlled by your nausea medication, call the clinic. If it is after clinic hours your family physician or the after hours number for the clinic or go to the Emergency Department.   BELOW ARE SYMPTOMS THAT SHOULD BE REPORTED IMMEDIATELY:  *FEVER GREATER THAN 100.5 F  *CHILLS WITH OR WITHOUT FEVER  NAUSEA AND VOMITING THAT IS NOT CONTROLLED WITH YOUR NAUSEA MEDICATION  *UNUSUAL SHORTNESS OF BREATH  *UNUSUAL BRUISING OR BLEEDING  TENDERNESS IN MOUTH AND THROAT WITH OR WITHOUT PRESENCE OF ULCERS  *URINARY PROBLEMS  *BOWEL PROBLEMS  UNUSUAL RASH Items with * indicate a potential emergency and should be followed up as soon as possible.  Feel free to call the clinic you have any questions or concerns. The clinic phone number is [phone].   I have been informed and understand all the instructions given to me. I know to contact the clinic, my physician, or go to the Emergency Department if any problems should occur. I do not have any questions at this time, but understand that I may call the clinic during office hours   should I have any questions or need assistance in obtaining follow up care.    __________________________________________  _____________  __________ Signature of Patient or Authorized Representative            Date                   Time    __________________________________________ Nurse's Signature

## 2012-10-20 ENCOUNTER — Ambulatory Visit (HOSPITAL_BASED_OUTPATIENT_CLINIC_OR_DEPARTMENT_OTHER): Payer: BC Managed Care – PPO

## 2012-10-20 VITALS — BP 138/53 | HR 69 | Temp 97.4°F | Resp 16

## 2012-10-20 DIAGNOSIS — C9110 Chronic lymphocytic leukemia of B-cell type not having achieved remission: Secondary | ICD-10-CM

## 2012-10-20 DIAGNOSIS — Z5189 Encounter for other specified aftercare: Secondary | ICD-10-CM

## 2012-10-20 MED ORDER — PEGFILGRASTIM INJECTION 6 MG/0.6ML
6.0000 mg | Freq: Once | SUBCUTANEOUS | Status: AC
  Administered 2012-10-20: 6 mg via SUBCUTANEOUS

## 2012-10-24 ENCOUNTER — Ambulatory Visit (HOSPITAL_BASED_OUTPATIENT_CLINIC_OR_DEPARTMENT_OTHER): Payer: BC Managed Care – PPO

## 2012-10-24 ENCOUNTER — Other Ambulatory Visit (HOSPITAL_BASED_OUTPATIENT_CLINIC_OR_DEPARTMENT_OTHER): Payer: BC Managed Care – PPO

## 2012-10-24 ENCOUNTER — Telehealth: Payer: Self-pay | Admitting: Oncology

## 2012-10-24 ENCOUNTER — Ambulatory Visit (HOSPITAL_BASED_OUTPATIENT_CLINIC_OR_DEPARTMENT_OTHER): Payer: BC Managed Care – PPO | Admitting: Oncology

## 2012-10-24 ENCOUNTER — Telehealth: Payer: Self-pay | Admitting: *Deleted

## 2012-10-24 VITALS — BP 107/52 | HR 63 | Temp 97.8°F | Resp 18

## 2012-10-24 VITALS — BP 130/72 | HR 83 | Temp 97.4°F | Resp 20 | Ht 72.0 in | Wt 207.9 lb

## 2012-10-24 DIAGNOSIS — D709 Neutropenia, unspecified: Secondary | ICD-10-CM

## 2012-10-24 DIAGNOSIS — C9110 Chronic lymphocytic leukemia of B-cell type not having achieved remission: Secondary | ICD-10-CM

## 2012-10-24 DIAGNOSIS — R05 Cough: Secondary | ICD-10-CM

## 2012-10-24 LAB — CBC WITH DIFFERENTIAL/PLATELET
BASO%: 0.1 % (ref 0.0–2.0)
EOS%: 0.5 % (ref 0.0–7.0)
LYMPH%: 0.8 % — ABNORMAL LOW (ref 14.0–49.0)
MCHC: 34.4 g/dL (ref 32.0–36.0)
MCV: 109.1 fL — ABNORMAL HIGH (ref 79.3–98.0)
MONO%: 6.8 % (ref 0.0–14.0)
Platelets: 42 10*3/uL — ABNORMAL LOW (ref 140–400)
RBC: 3.32 10*6/uL — ABNORMAL LOW (ref 4.20–5.82)

## 2012-10-24 LAB — COMPREHENSIVE METABOLIC PANEL (CC13)
ALT: 73 U/L — ABNORMAL HIGH (ref 0–55)
Alkaline Phosphatase: 115 U/L (ref 40–150)
Sodium: 137 mEq/L (ref 136–145)
Total Bilirubin: 0.66 mg/dL (ref 0.20–1.20)
Total Protein: 5.9 g/dL — ABNORMAL LOW (ref 6.4–8.3)

## 2012-10-24 MED ORDER — ACETAMINOPHEN 325 MG PO TABS
650.0000 mg | ORAL_TABLET | Freq: Once | ORAL | Status: AC
  Administered 2012-10-24: 650 mg via ORAL

## 2012-10-24 MED ORDER — VARENICLINE TARTRATE 1 MG PO TABS: 1.0000 mg | ORAL_TABLET | Freq: Two times a day (BID) | ORAL | Status: DC

## 2012-10-24 MED ORDER — IMMUNE GLOBULIN (HUMAN) 10 GM/100ML IV SOLN: 0.5000 g/kg | Freq: Once | INTRAVENOUS | Status: DC

## 2012-10-24 MED ORDER — DIPHENHYDRAMINE HCL 25 MG PO CAPS
ORAL_CAPSULE | ORAL | Status: AC
  Filled 2012-10-24: qty 1

## 2012-10-24 MED ORDER — IMMUNE GLOBULIN (HUMAN) 5 GM/100ML IV SOLN
400.0000 mg/kg | Freq: Once | INTRAVENOUS | Status: AC
  Administered 2012-10-24: 10:00:00 35 g via INTRAVENOUS
  Filled 2012-10-24: qty 100

## 2012-10-24 MED ORDER — DIPHENHYDRAMINE HCL 25 MG PO CAPS
25.0000 mg | ORAL_CAPSULE | Freq: Once | ORAL | Status: AC
  Administered 2012-10-24: 25 mg via ORAL

## 2012-10-24 MED ORDER — ACETAMINOPHEN 325 MG PO TABS
ORAL_TABLET | ORAL | Status: AC
  Filled 2012-10-24: qty 2

## 2012-10-24 NOTE — Patient Instructions (Signed)
IMMUNE GLOBULIN (im MUNE GLOB yoo lin) helps to prevent or reduce the severity of certain infections in patients who are at risk. This medicine is collected from the pooled blood of many donors. It is used to treat immune system problems, thrombocytopenia, and Kawasaki syndrome. This medicine may be used for other purposes; ask your health care provider or pharmacist if you have questions. What should I tell my health care provider before I take this medicine? They need to know if you have any of these conditions: - diabetes - extremely low or no immune antibodies in the blood - heart disease - history of blood clots - hyperprolinemia - infection in the blood, sepsis - kidney disease - taking medicine that may change kidney function - ask your health care provider about your medicine - an unusual or allergic reaction to human immune globulin, albumin, maltose, sucrose, polysorbate 80, other medicines, foods, dyes, or preservatives - pregnant or trying to get pregnant - breast-feeding How should I use this medicine? This medicine is for injection into a muscle or infusion into a vein or skin. It is usually given by a health care professional in a hospital or clinic setting. In rare cases, some brands of this medicine might be given at home. You will be taught how to give this medicine. Use exactly as directed. Take your medicine at regular intervals. Do not take your medicine more often than directed. Talk to your pediatrician regarding the use of this medicine in children. Special care may be needed. Overdosage: If you think you have taken too much of this medicine contact a poison control center or emergency room at once. NOTE: This medicine is only for you. Do not share this medicine with others. What if I miss a dose? It is important not to miss your dose. Call your doctor or health care professional if you are unable to keep an appointment. If you give yourself the medicine and  you miss a dose, take it as soon as you can. If it is almost time for your next dose, take only that dose. Do not take double or extra doses. What may interact with this medicine? -aspirin and aspirin-like medicines -cisplatin -cyclosporine -medicines for infection like acyclovir, adefovir, amphotericin B, bacitracin, cidofovir, foscarnet, ganciclovir, gentamicin, pentamidine, vancomycin -NSAIDS, medicines for pain and inflammation, like ibuprofen or naproxen -pamidronate -vaccines -zoledronic acid This list may not describe all possible interactions. Give your health care provider a list of all the medicines, herbs, non-prescription drugs, or dietary supplements you use. Also tell them if you smoke, drink alcohol, or use illegal drugs. Some items may interact with your medicine. What should I watch for while using this medicine? Your condition will be monitored carefully while you are receiving this medicine. This medicine is made from pooled blood donations of many different people. It may be possible to pass an infection in this medicine. However, the donors are screened for infections and all products are tested for HIV and hepatitis. The medicine is treated to kill most or all bacteria and viruses. Talk to your doctor about the risks and benefits of this medicine. Do not have vaccinations for at least 14 days before, or until at least 3 months after receiving this medicine. What side effects may I notice from receiving this medicine? Side effects that you should report to your doctor or health care professional as soon as possible: -allergic reactions like skin rash, itching or hives, swelling of the face, lips, or tongue -breathing problems -  chest pain or tightness -fever, chills -headache with nausea, vomiting -neck pain or difficulty moving neck -pain when moving eyes -pain, swelling, warmth in the leg -problems with balance, talking, walking -sudden weight gain -swelling of the  ankles, feet, hands -trouble passing urine or change in the amount of urine Side effects that usually do not require medical attention (report to your doctor or health care professional if they continue or are bothersome): -dizzy, drowsy -flushing -increased sweating -leg cramps -muscle aches and pains -pain at site where injected This list may not describe all possible side effects. Call your doctor for medical advice about side effects. You may report side effects to FDA at 1-800-FDA-1088. Where should I keep my medicine? Keep out of the reach of children. This drug is usually given in a hospital or clinic and will not be stored at home. In rare cases, some brands of this medicine may be given at home. If you are using this medicine at home, you will be instructed on how to store this medicine. Throw away any unused medicine after the expiration date on the label. NOTE: This sheet is a summary. It may not cover all possible information. If you have questions about this medicine, talk to your doctor, pharmacist, or health care provider.  2012, [employer]. (04/23/2008 11:44:49 AM)

## 2012-10-24 NOTE — Progress Notes (Signed)
IVIG rate increased to 377 ml's/hr for 189 ml's

## 2012-10-24 NOTE — Telephone Encounter (Signed)
, °

## 2012-10-24 NOTE — Telephone Encounter (Signed)
Per staff message and POF I have scheduled appts.  JMW  

## 2012-10-24 NOTE — Progress Notes (Signed)
OFFICE PROGRESS NOTE  CC  Ryan Copeland [address] [place] [address]  DIAGNOSIS: 62 year old gentleman with stage III CLL with progressive disease.   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 Patient began IVIG q. monthly  CURRENT THERAPY:  Status post cycle #5 of FCR days 1-3 /IVIG q. monthly   INTERVAL HISTORY: Ryan Copeland 62 y.o. male returns for followup visit. Overall patient is doing well. However usually by day 8 he does feel bad. He continues to have a cough it is clear productive. He has no nausea or vomiting no fevers chills or night sweats. Has no bleeding problems. His abdomen is not full is soft. Remainder of the 10 point review of systems is negative.  MEDICAL HISTORY: Past Medical History  Diagnosis Date  . 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 Hyperlipidemia   . Tobacco abuse   . PVD (peripheral vascular disease)   . 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 Diabetes mellitus     ALLERGIES:  is allergic to codeine.  MEDICATIONS:  Current Outpatient Prescriptions  Medication Sig Dispense Refill  . acyclovir (ZOVIRAX) 400 MG tablet Take 1 tablet (400 mg total) by mouth daily.  30 tablet  5  . allopurinol (ZYLOPRIM) 300 MG tablet Take 1 tablet (300 mg total) by mouth daily.  30 tablet  6  . ALPRAZolam (XANAX) 0.25 MG tablet Take 1 po q 6 hours prn for anxiety  90 tablet  1  . aspirin 325 MG tablet Take 325 mg by mouth daily.        . B Complex Vitamins (VITAMIN B COMPLEX IJ) Inject as directed every 30 (thirty) days.      . cyanocobalamin (,VITAMIN B-12,) 1000 MCG/ML injection Inject 1 mL (1,000 mcg total) into the muscle every 30 (thirty) days.  3 mL  1  . dexamethasone (DECADRON) 4 MG tablet Take 2 tablets (8 mg total) by mouth 2 (two) times daily with a meal. Take  daily starting the day after chemotherapy for 2 days. Take with food.  30 tablet  1  . diphenhydrAMINE (BENADRYL) 25 mg capsule Take 50 mg by mouth at bedtime as needed for itching or sleep.      Marland Kitchen esomeprazole (NEXIUM) 40 MG capsule Take 1 capsule (40 mg total) by mouth 2 (two) times daily.  180 capsule  3  . lidocaine-prilocaine (EMLA) cream Apply topically as needed. Place cream on port site 1-2 hours prior to treatment.  30 g  2  . LORazepam (ATIVAN) 0.5 MG tablet Take 1 tablet (0.5 mg total) by mouth every 6 (six) hours as needed (Nausea or vomiting).  30 tablet  0  . metFORMIN (GLUCOPHAGE-XR) 500 MG 24 hr tablet Take 500 mg by mouth 2 (two) times daily.      . metoprolol succinate (TOPROL-XL) 50 MG 24 hr tablet Take 1 tablet (50 mg total) by mouth daily with breakfast. Take with or immediately following a meal.  90 tablet  0  . ondansetron (ZOFRAN) 8 MG tablet Take 1 tablet (8 mg total) by mouth 2 (two) times daily. Take two times a day starting the day after chemo for 2 days. Then take two times a day as needed for nausea or vomiting.  30 tablet  1  . pregabalin (LYRICA) 75 MG capsule 1  cap three times daily and 2 caps at bedtime  150 capsule  6  . sulfamethoxazole-trimethoprim (BACTRIM DS,SEPTRA DS) 800-160 MG per tablet Take 1 tablet by mouth 3 (three) times a week.  12 tablet  5  . tetrahydrozoline 0.05 % ophthalmic solution Place 1 drop into both eyes as needed. Red eyes      . amoxicillin-clavulanate (AUGMENTIN) 875-125 MG per tablet Take 1 tablet by mouth 2 (two) times daily.  14 tablet  0  . ciprofloxacin (CIPRO) 500 MG tablet Take 1 tablet (500 mg total) by mouth 2 (two) times daily.  14 tablet  6  . fluticasone (FLOVENT HFA) 110 MCG/ACT inhaler Inhale 1 puff into the lungs 2 (two) times daily.  1 Inhaler  12  . Ipratropium-Albuterol (COMBIVENT) 20-100 MCG/ACT AERS respimat Inhale 1 puff into the lungs every 6 (six) hours.  1 Inhaler  6  . nitroGLYCERIN (NITROSTAT) 0.4 MG SL tablet  Place 1 tablet (0.4 mg total) under the tongue every 5 (five) minutes as needed.  25 tablet  12  . oxyCODONE (OXY IR/ROXICODONE) 5 MG immediate release tablet Take 1 tablet (5 mg total) by mouth every 4 (four) hours as needed for pain.  30 tablet  0  . prasugrel (EFFIENT) 10 MG TABS Take 10 mg by mouth daily with breakfast.      . prochlorperazine (COMPAZINE) 10 MG tablet Take 1 tablet (10 mg total) by mouth every 6 (six) hours as needed (Nausea or vomiting).  30 tablet  1  . prochlorperazine (COMPAZINE) 25 MG suppository Place 1 suppository (25 mg total) rectally every 12 (twelve) hours as needed for nausea.  12 suppository  3  . topiramate (TOPAMAX) 25 MG capsule Take 1 capsule (25 mg total) by mouth 2 (two) times daily.  180 capsule  1  . varenicline (CHANTIX CONTINUING MONTH PAK) 1 MG tablet Take 1 tablet (1 mg total) by mouth 2 (two) times daily.  90 tablet  1   No current facility-administered medications for this visit.   Facility-Administered Medications Ordered in Other Visits  Medication Dose Route Frequency Provider Last Rate Last Dose  . sodium chloride 0.9 % injection 10 mL  10 mL Intracatheter PRN Victorino December, MD   10 mL at 08/22/12 1721    SURGICAL HISTORY:  Past Surgical History  Procedure Laterality Date  . Femoral stents    . Coronary stent placement  May 2011  . Cholecystectomy  2007    REVIEW OF SYSTEMS:   General: fatigue (-), night sweats (-), fever (-), pain (-) Lymph: palpable nodes (-) HEENT: vision changes (-), mucositis (-), gum bleeding (-), epistaxis (-) Cardiovascular: chest pain (-), palpitations (-) Pulmonary: shortness of breath (-), dyspnea on exertion (-), cough (-), hemoptysis (-) GI:  Early satiety (-), melena (-), dysphagia (-), nausea/vomiting (-), diarrhea (-) GU: dysuria (-), hematuria (-), incontinence (-) Musculoskeletal: joint swelling (-), joint pain (-), back pain (-) Neuro: weakness (-), numbness (-), headache (-), confusion (-) Skin:  Rash (-), lesions (-), dryness (-) Psych: depression (-), suicidal/homicidal ideation (-), feeling of hopelessness (-)   PHYSICAL EXAMINATION:  BP 130/72  Pulse 83  Temp(Src) 97.4 F (36.3 C) (Oral)  Resp 20  Ht 6' (1.829 m)  Wt 207 lb 14.4 oz (94.303 kg)  BMI 28.19 kg/m2 General: Patient is a well appearing male in no acute distress HEENT: PERRLA, sclerae anicteric no conjunctival pallor, MMM Neck: supple, no palpable adenopathy Lungs: clear to auscultation bilaterally, no wheezes, rhonchi,  or rales Cardiovascular: regular rate rhythm, S1, S2, no murmurs, rubs or gallops Abdomen: Soft, non-tender, non-distended, normoactive bowel sounds, liver is not palpable the splenic tip is palpable  Extremities: warm and well perfused, no clubbing, cyanosis, or edema Skin: No rashes or lesions Neuro: Non-focal ECOG PERFORMANCE STATUS: 0 - Asymptomatic  LABORATORY DATA: Lab Results  Component Value Date   WBC 2.6* 10/24/2012   HGB 12.4* 10/24/2012   HCT 36.2* 10/24/2012   MCV 109.1* 10/24/2012   PLT 42* 10/24/2012      Chemistry      Component Value Date/Time   NA 137 10/24/2012 0852   NA 134* 06/01/2012 1145   K 4.1 10/24/2012 0852   K 4.6 06/01/2012 1145   CL 104 08/03/2012 1229   CL 103 06/01/2012 1145   CO2 23 10/24/2012 0852   CO2 24 06/01/2012 1145   BUN 13.5 10/24/2012 0852   BUN 15 06/01/2012 1145   CREATININE 1.0 10/24/2012 0852   CREATININE 0.96 06/01/2012 1145      Component Value Date/Time   CALCIUM 9.0 10/24/2012 0852   CALCIUM 9.5 06/01/2012 1145   ALKPHOS 115 10/24/2012 0852   ALKPHOS 65 08/22/2011 0904   AST 20 10/24/2012 0852   AST 19 08/22/2011 0904   ALT 73* 10/24/2012 0852   ALT 27 08/22/2011 0904   BILITOT 0.66 10/24/2012 0852   BILITOT 0.5 08/22/2011 0904    FINAL DIAGNOSIS Diagnosis Bone Marrow, Aspirate,Biopsy, and Clot, right iliac bone - HYPERCELLULAR BONE MARROW WITH EXTENSIVE INVOLVEMENT BY CHRONIC LYMPHOCYTIC LEUKEMIA. PERIPHERAL BLOOD: - CHRONIC LYMPHOCYTIC  LEUKEMIA. [name] MD Pathologist, Electronic Signature (Case signed 11/22/2011) GROSS AND MICROSCOPIC INFORMATION Specimen Clinical Information hx CLL [jl] Source Bone Marrow, Aspirate,Biopsy, and Clot, right iliac bone Microscopic LAB DATA: CBC performed on 11/18/11 shows: WBC 80.87 K/ul Neutrophils 5% HB 15.7 g/dl Lymphocytes 19% HCT 14.7 % Monocytes 1% MCV 96.0 fL Eosinophils 0% RDW 15.4 % Basophils 0% PLT 81 K/ul PERIPHERAL BLOOD SMEAR: The red blood cells display mild anisopoikilocytosis with minimal polychromasia. The white blood cells are markedly increased in number with lymphocytosis. The lymphocytes primarily consist of small lymphoid cells with generally high nuclear cytoplasmic ratio, dense chromatin with block type clumping, and inconspicuous nucleoli. There are numerous smudge cells in the 1 of 3 FINAL for MAXIMOS, ZAYAS [phone]) Microscopic(continued) background. A significant prolymphocytic component is not present. The platelets are decreased in number. BONE MARROW ASPIRATE: Erythroid precursors: Relatively decreased with progressive maturation. Granulocytic precursors: Relatively decreased with progressive maturation. Megakaryocytes: Scattered forms are seen displaying normal morphology. Lymphocytes/plasma cells: The lymphocytes are markedly increased in number representing 95% of all cells and consist of small lymphoid cells with high nuclear cytoplasmic ratio, dense chromatin and inconspicuous nucleoli. A significant prolymphocytic or large lymphoid cell component is not present. Significant plasma cell aggregates are not present. TOUCH PREPARATIONS: Predominance of small lymphoid cells. CLOT and BIOPSY: The sections show 90% cellularity with extensive infiltration of the medullary space by numerous aggregates, interstitial infiltrates and diffuse sheets of primarily small lymphoid cells displaying high nuclear cytoplasmic ratio, dense chromatin and  small to inconspicuous nucleoli. Scattered small proliferation centers are seen. Myeloid hematopoiesis appears decreased in the background. Immunohistochemical stains for CD20, CD79a, CD3, CD5, CD10 and cyclin D1 were performed with appropriate controls. The lymphoid population is stains positively ffor CD79a (B cell marker) with patchy weaker positivity for CD20. There is patchy weak coexpression of CD5. No CD10 or cyclin D1 expression is identified. IRON STAIN: Iron  stains are performed on a bone marrow aspirate smear and section of clot. The controls stained appropriately. Storage Iron: Decreased. Ringed Sideroblasts: Absent. ADDITIONAL DATA / TESTING: Specimen was sent for cytogenetic analysis and a separate report will follow. Flow cytometric analysis of the lymphoid population ([id number]) shows a major B cell population expressing pan B cell antigens including CD20 and CD23 associated with coexpression of CD5. This population shows extremely dim/negative staining for surface immunoglobulin light chains. (BNS:gt, 11/21/11) Specimen Table Bone Marrow count performed on 500 cells shows: Blasts: 0% Myeloid 3% Promyelocyts: 0% Myelocytes: 0% Erythroid 2% Metamyelocyts: 1% Bands: 1% Lymphocytes: 95% Neutrophils: 1% Eosinophils: 0% Plasma Cells: 0% Basophils: 0% Monocytes: 0% M:E ratio: 1.5 Gross Received in [facility] are tissue fragments which aggregate 0.4 x 0.4 x 0.1 cm. The specimen is submitted in toto. Received in a separate container in [facility] are two cores of bone measuring 2.2 and 2.8 cm in length and each 0.2 cm in diameter. The specimen is submitted in toto following decalcification. (GP:eps 11/18/11) 2 of   RADIOGRAPHIC STUDIES: CT CHEST  Findings: Bilateral axillary lymphadenopathy is slightly  progressed compared to prior. For example, index node in the  right axilla measures 15 mm short axis (image 14) compared to 9 mm  on prior. Adjacent rounded 20 mm lymph node (#8)  compares to 13 mm  on prior. There are clustered small supraclavicular lymph nodes  which also appears slightly enlarged. Within the mediastinum small  paratracheal lymph nodes are similar. No pericardial fluid.  Esophagus is normal. Coronary artery stent is noted.  Review of the lung parenchyma demonstrates no new suspicious  pulmonary nodules.  IMPRESSION:  1. Mild continued progressive enlargement of axillary  lymphadenopathy.  2. Stable supraclavicular and mediastinal adenopathy.  CT ABDOMEN AND PELVIS  Findings: Retroperitoneal lymphadenopathy is slightly progressed  compared to prior. For example conglomerate of nodes left of the  aorta measures 26 mm short axis compared to 22 mm on prior (image  71). Periportal lymph node measures 23 mm compared to 20 mm on  prior.  The spleen is massively enlarged with a calculated volume of 2362  ml compared to 1972 ml on prior.  Bilateral external iliac lymph nodes are increased in volume. Left  node measures 19mm short axis compared to 13 on prior. Inguinal  adenopathy is also increased.  The liver, liver is normal. Gallbladder is absent. The spleen,  adrenal glands, kidneys are normal.  The colon rectosigmoid colon are normal.  The bladder prostate gland normal. No pelvic fluid.  IMPRESSION:  1. Progressive increase in retroperitoneal, iliac, and inguinal  lymphadenopathy.  2. Interval increase in massive splenomegaly.     ASSESSMENT: 62 year old gentleman with  #1 CLL progressive Now receiving chemotherapy. His physical revealed decrease in his lymphadenopathy in the cervical and axillary area. Spleen is also improved. He is s/p cycle #3 of FCR . He did receive neulasta with cycle 3 of his chemotherapy due to profound neutropenia  #2 dental abscess resolved   #3 chronic pulmonary infection: He has been on prolonged course of oral antibiotics with now resolution of this.   #4 He is s/p IVIG dose 1 last week on 08/31/12, which he  tolerated well. He tells me that his cough and productive sputum has resolved and he feels much better, we will continue IVIG monthly  #5 smoking cessation: Patient is given a prescription for Chantix.   PLAN:  #1 Tolerated cycle 5 of FCR well  #2 patient is now  here for monthly IVIG.  #3 his counts are stable however they do remain low.  #4 he has not had any intercurrent illnesses.  #5 he will return in 3 weeks' time for cycle 6 of FCR.   All questions were answered. The patient knows to call the clinic with any problems, questions or concerns. We can certainly see the patient much sooner if necessary.  I spent 25 minutes counseling the patient face to face. The total time spent in the appointment was 30 minutes.  Drue Second, MD Medical/Oncology [facility] [phone] (beeper) [phone] (Office)

## 2012-10-29 ENCOUNTER — Ambulatory Visit: Payer: Self-pay | Admitting: Nurse Practitioner

## 2012-11-02 ENCOUNTER — Encounter: Payer: Self-pay | Admitting: Nurse Practitioner

## 2012-11-02 ENCOUNTER — Other Ambulatory Visit: Payer: Self-pay | Admitting: *Deleted

## 2012-11-02 ENCOUNTER — Ambulatory Visit (INDEPENDENT_AMBULATORY_CARE_PROVIDER_SITE_OTHER): Payer: BC Managed Care – PPO | Admitting: Nurse Practitioner

## 2012-11-02 VITALS — BP 120/70 | HR 64 | Ht 72.0 in | Wt 207.4 lb

## 2012-11-02 DIAGNOSIS — E785 Hyperlipidemia, unspecified: Secondary | ICD-10-CM

## 2012-11-02 DIAGNOSIS — I739 Peripheral vascular disease, unspecified: Secondary | ICD-10-CM

## 2012-11-02 DIAGNOSIS — I2510 Atherosclerotic heart disease of native coronary artery without angina pectoris: Secondary | ICD-10-CM

## 2012-11-02 LAB — BASIC METABOLIC PANEL
BUN: 13 mg/dL (ref 6–23)
CO2: 24 mEq/L (ref 19–32)
Calcium: 8.7 mg/dL (ref 8.4–10.5)
Chloride: 105 mEq/L (ref 96–112)
Creatinine, Ser: 0.8 mg/dL (ref 0.4–1.5)
GFR: 101.35 mL/min (ref >60.00)
Glucose, Bld: 261 mg/dL — ABNORMAL HIGH (ref 70–99)
Potassium: 4.1 mEq/L (ref 3.5–5.1)
Sodium: 134 mEq/L — ABNORMAL LOW (ref 135–145)

## 2012-11-02 LAB — LIPID PANEL
Cholesterol: 158 mg/dL (ref 0–200)
HDL: 40.6 mg/dL (ref >39.00)
LDL Cholesterol: 92 mg/dL (ref 0–99)
Total CHOL/HDL Ratio: 4
Triglycerides: 126 mg/dL (ref 0.0–149.0)
VLDL: 25.2 mg/dL (ref 0.0–40.0)

## 2012-11-02 LAB — HEPATIC FUNCTION PANEL
ALT: 74 U/L — ABNORMAL HIGH (ref 0–53)
AST: 30 U/L (ref 0–37)
Albumin: 3.8 g/dL (ref 3.5–5.2)
Alkaline Phosphatase: 82 U/L (ref 39–117)
Bilirubin, Direct: 0.1 mg/dL (ref 0.0–0.3)
Total Bilirubin: 0.6 mg/dL (ref 0.3–1.2)
Total Protein: 6.3 g/dL (ref 6.0–8.3)

## 2012-11-02 MED ORDER — METOPROLOL SUCCINATE ER 50 MG PO TB24: 50.0000 mg | ORAL_TABLET | Freq: Every day | ORAL | Status: DC

## 2012-11-02 MED ORDER — NITROGLYCERIN 0.4 MG SL SUBL: 0.4000 mg | SUBLINGUAL_TABLET | SUBLINGUAL | Status: DC | PRN

## 2012-11-02 MED ORDER — PRASUGREL HCL 10 MG PO TABS: 10.0000 mg | ORAL_TABLET | Freq: Every day | ORAL | Status: DC

## 2012-11-02 MED ORDER — ROSUVASTATIN CALCIUM 5 MG PO TABS
5.0000 mg | ORAL_TABLET | Freq: Every day | ORAL | Status: DC
Start: 2012-11-02 — End: 2013-01-22

## 2012-11-02 NOTE — Progress Notes (Signed)
Ryan Copeland Date of Birth: Apr 20, 1950 Medical Record #[id number]  History of Present Illness: Ryan Copeland is seen back today for a follow up visit. Seen for Dr. Elease Hashimoto - former patient of Dr. Ronnald Nian. Has known prior NSTEMI and stenting of the LAD in 2011. Other issues include ongoing tobacco abuse, PVD with past iliac stenting, HTN, HLD and CLL.  Last seen here back in June of 2013. Was having exertional chest pain - referred for cardiac cath. Stent was patent. Has known totally occluded RCA that fills by collaterals - to continue with medical management.   Comes in today. Here alone. Doing ok. Very rare chest pain. Rare use of NTG. Remains fairly active. Energy level is ok. Not short of breath. Needs his medicines refilled. Has not had any follow up with [facility]. Overall, seems to be doing ok. Back on Chantix and trying to stop smoking - he has used this in the past and it did not help. He has had more luck with the patches.   Current Outpatient Prescriptions  Medication Sig Dispense Refill  . acyclovir (ZOVIRAX) 400 MG tablet Take 1 tablet (400 mg total) by mouth daily.  30 tablet  5  . allopurinol (ZYLOPRIM) 300 MG tablet Take 1 tablet (300 mg total) by mouth daily.  30 tablet  6  . ALPRAZolam (XANAX) 0.25 MG tablet Take 1 po q 6 hours prn for anxiety  90 tablet  1  . amoxicillin-clavulanate (AUGMENTIN) 875-125 MG per tablet Take 1 tablet by mouth as needed.      Marland Kitchen aspirin 325 MG tablet Take 325 mg by mouth daily.        . B Complex Vitamins (VITAMIN B COMPLEX IJ) Inject as directed every 30 (thirty) days.      . ciprofloxacin (CIPRO) 500 MG tablet Take 500 mg by mouth as needed.      . cyanocobalamin (,VITAMIN B-12,) 1000 MCG/ML injection Inject 1 mL (1,000 mcg total) into the muscle every 30 (thirty) days.  3 mL  1  . dexamethasone (DECADRON) 4 MG tablet Take 2 tablets (8 mg total) by mouth 2 (two) times daily with a meal. Take daily starting the day after chemotherapy for 2 days. Take  with food.  30 tablet  1  . diphenhydrAMINE (BENADRYL) 25 mg capsule Take 50 mg by mouth at bedtime as needed for itching or sleep.      Marland Kitchen esomeprazole (NEXIUM) 40 MG capsule Take 1 capsule (40 mg total) by mouth 2 (two) times daily.  180 capsule  3  . fluticasone (FLOVENT HFA) 110 MCG/ACT inhaler Inhale 1 puff into the lungs 2 (two) times daily.  1 Inhaler  12  . Ipratropium-Albuterol (COMBIVENT) 20-100 MCG/ACT AERS respimat Inhale 1 puff into the lungs every 6 (six) hours.  1 Inhaler  6  . lidocaine-prilocaine (EMLA) cream Apply topically as needed. Place cream on port site 1-2 hours prior to treatment.  30 g  2  . LORazepam (ATIVAN) 0.5 MG tablet Take 1 tablet (0.5 mg total) by mouth every 6 (six) hours as needed (Nausea or vomiting).  30 tablet  0  . metFORMIN (GLUCOPHAGE-XR) 500 MG 24 hr tablet Take 500 mg by mouth 2 (two) times daily.      . metoprolol succinate (TOPROL-XL) 50 MG 24 hr tablet Take 1 tablet (50 mg total) by mouth daily with breakfast. Take with or immediately following a meal.  90 tablet  3  . nitroGLYCERIN (NITROSTAT) 0.4 MG SL tablet  Place 1 tablet (0.4 mg total) under the tongue every 5 (five) minutes as needed.  25 tablet  12  . ondansetron (ZOFRAN) 8 MG tablet Take 1 tablet (8 mg total) by mouth 2 (two) times daily. Take two times a day starting the day after chemo for 2 days. Then take two times a day as needed for nausea or vomiting.  30 tablet  1  . oxyCODONE (OXY IR/ROXICODONE) 5 MG immediate release tablet Take 1 tablet (5 mg total) by mouth every 4 (four) hours as needed for pain.  30 tablet  0  . prasugrel (EFFIENT) 10 MG TABS tablet Take 1 tablet (10 mg total) by mouth daily with breakfast.  90 tablet  3  . pregabalin (LYRICA) 75 MG capsule 1 cap three times daily and 2 caps at bedtime  150 capsule  6  . prochlorperazine (COMPAZINE) 10 MG tablet Take 1 tablet (10 mg total) by mouth every 6 (six) hours as needed (Nausea or vomiting).  30 tablet  1  . prochlorperazine  (COMPAZINE) 25 MG suppository Place 1 suppository (25 mg total) rectally every 12 (twelve) hours as needed for nausea.  12 suppository  3  . sulfamethoxazole-trimethoprim (BACTRIM DS,SEPTRA DS) 800-160 MG per tablet Take 1 tablet by mouth 3 (three) times a week.  12 tablet  5  . tetrahydrozoline 0.05 % ophthalmic solution Place 1 drop into both eyes as needed. Red eyes      . topiramate (TOPAMAX) 25 MG capsule Take 1 capsule (25 mg total) by mouth 2 (two) times daily.  180 capsule  1  . varenicline (CHANTIX CONTINUING MONTH PAK) 1 MG tablet Take 1 tablet (1 mg total) by mouth 2 (two) times daily.  90 tablet  1   No current facility-administered medications for this visit.   Facility-Administered Medications Ordered in Other Visits  Medication Dose Route Frequency Provider Last Rate Last Dose  . sodium chloride 0.9 % injection 10 mL  10 mL Intracatheter PRN Victorino December, MD   10 mL at 08/22/12 1721    Allergies  Allergen Reactions  . Codeine Hives    Pt states he can take a few, more reaction with extended doses.    Past Medical History  Diagnosis Date  . 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 Hyperlipidemia   . Tobacco abuse   . PVD (peripheral vascular disease)   . 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 Diabetes mellitus     Past Surgical History  Procedure Laterality Date  . Femoral stents    . Coronary stent placement  May 2011  . Cholecystectomy  2007    History  Smoking status  . Current Every Day Smoker -- 1.00 packs/day for 43 years  . Types: Cigarettes  Smokeless tobacco  . Never Used    History  Alcohol Use  . 7.2 oz/week  . 12 Cans of beer per week    Comment: weekly    Family History  Problem Relation Age of Onset  . Lung cancer Mother 27  . Cancer Mother     lung  . Heart failure Father 76  . Heart disease Father     Review of Systems: The review of systems is per the HPI.  All other systems were  reviewed and are negative.  Physical Exam: BP 120/70  Pulse 64  Ht 6' (1.829 m)  Wt 207 lb 6.4 oz (94.076 kg)  BMI  28.12 kg/m2 Patient is very pleasant and in no acute distress. Skin is warm and dry. Color is normal.  HEENT is unremarkable. No carotid bruits. Normocephalic/atraumatic. PERRL. Sclera are nonicteric. Neck is supple. No masses. No JVD. Lungs are clear. Cardiac exam shows a regular rate and rhythm. Abdomen is soft. Extremities are without edema. Gait and ROM are intact. No gross neurologic deficits noted.  LABORATORY DATA:  Lab Results  Component Value Date   WBC 2.6* 10/24/2012   HGB 12.4* 10/24/2012   HCT 36.2* 10/24/2012   PLT 42* 10/24/2012   GLUCOSE 405* 10/24/2012   CHOL 215* 08/22/2011   TRIG 289.0* 08/22/2011   HDL 31.70* 08/22/2011   LDLDIRECT 117.6 08/22/2011   LDLCALC  Value: 127        Total Cholesterol/HDL:CHD Risk Coronary Heart Disease Risk Table                     Men   Women  1/2 Average Risk   3.4   3.3  Average Risk       5.0   4.4  2 X Average Risk   9.6   7.1  3 X Average Risk  23.4   11.0        Use the calculated Patient Ratio above and the CHD Risk Table to determine the patient's CHD Risk.        ATP III CLASSIFICATION (LDL):  <100     mg/dL   Optimal  161-096  mg/dL   Near or Above                    Optimal  130-159  mg/dL   Borderline  045-409  mg/dL   High  >811     mg/dL   Very High* 10/29/7827   ALT 73* 10/24/2012   AST 20 10/24/2012   NA 137 10/24/2012   K 4.1 10/24/2012   CL 104 08/03/2012   CREATININE 1.0 10/24/2012   BUN 13.5 10/24/2012   CO2 23 10/24/2012   INR 1.16 06/01/2012   HGBA1C  Value: 6.3 (NOTE)                                                                       According to the ADA Clinical Practice Recommendations for 2011, when HbA1c is used as a screening test:   >=6.5%   Diagnostic of Diabetes Mellitus           (if abnormal result  is confirmed)  5.7-6.4%   Increased risk of developing Diabetes Mellitus  References:Diagnosis and  Classification of Diabetes Mellitus,Diabetes Care,2011,34(Suppl 1):S62-S69 and Standards of Medical Care in         Diabetes - 2011,Diabetes Care,2011,34  (Suppl 1):S11-S61.* 06/28/2009   Coronary angiography from July 2013:  Coronary dominance: right  Left mainstem: Normal.  Left anterior descending (LAD): The stent in the proximal LAD is widely patent throughout. There is moderate 20% narrowing in the LAD proximal to the stent. The remainder of the vessels without significant disease.  Left circumflex (LCx): The left circumflex gives rise to 2 large marginal branches. There is 20% narrowing prior to the takeoff of the first OM.  Right coronary artery (RCA): The right coronary  is a dominant vessel. It has diffuse 70% disease in the proximal to mid vessel. Is occluded at the crux. There are excellent right to right and left to right collaterals.   Left ventriculography: Left ventricular systolic function is normal, LVEF is estimated at 55-65%, there is no significant mitral regurgitation   Final Conclusions:  1. Single vessel obstructive coronary disease. 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 The stent in the proximal LAD is widely patent.  2. Normal LV function.  Recommendations: Continue medical management.  Ryan Copeland  08/26/2011, 9:09 AM  Assessment / Plan:  1. CAD - minimal symptoms - last cath a year ago - continue with medical management - his medicines are refilled today. I will see him back in 6 months.  2. HTN - BP looks good today.   3. HLD - will recheck his labs today. Apparently could not get Crestor covered in the past and never heard about the alternative to try. Will recheck and then start statin - probably Lipitor.   4. PVD- he would like to get back to see Dr. Myra Gianotti - will refer.   5. CLL -  With progressive disease - on chemotherapy per Dr. Park Breed and reports a good response.   I will see him back in 6 months.    Patient is agreeable to this plan and will call if any problems develop in the interim.   Rosalio Macadamia, RN, ANP-C [facility] [address] [place], [place]  [address]

## 2012-11-02 NOTE — Patient Instructions (Addendum)
Keep working on trying to stop smoking  Let's check labs today - I will probably restart a statin once I see them  Stay active  Stay on your current medicines - I have refilled the Effient and the Metoprolol  We will get you back to see Dr. Myra Gianotti for follow up.  I will see you in 6 months.  Call the [facility] office at [phone] if you have any questions, problems or concerns.

## 2012-11-07 ENCOUNTER — Encounter: Payer: Self-pay | Admitting: Oncology

## 2012-11-09 ENCOUNTER — Telehealth: Payer: Self-pay | Admitting: Medical Oncology

## 2012-11-09 NOTE — Telephone Encounter (Signed)
Patient informed letter faxed as requested. Original placed in mail for patient, copy sent to medical records.  Patient expressed thanks, no further questions at this time.

## 2012-11-09 NOTE — Telephone Encounter (Signed)
Late entry: Phone conversation from 11/07/12 F/U with patient regarding email he sent and need for letter to attorney. Informed patient request received. Patient requesting letter to be faxed to [facility] @ [phone] and original to be mailed to him. Reviewed with MD.  Per MD, letter faxed as requested by patient to [facility]. Marland Kitchen

## 2012-11-14 ENCOUNTER — Other Ambulatory Visit: Payer: Self-pay | Admitting: Lab

## 2012-11-14 ENCOUNTER — Ambulatory Visit: Payer: Self-pay

## 2012-11-14 ENCOUNTER — Ambulatory Visit: Payer: Self-pay | Admitting: Oncology

## 2012-11-15 ENCOUNTER — Ambulatory Visit: Payer: Self-pay

## 2012-11-16 ENCOUNTER — Ambulatory Visit: Payer: Self-pay

## 2012-11-17 ENCOUNTER — Ambulatory Visit: Payer: Self-pay

## 2012-11-19 ENCOUNTER — Other Ambulatory Visit: Payer: Self-pay | Admitting: *Deleted

## 2012-11-19 DIAGNOSIS — I739 Peripheral vascular disease, unspecified: Secondary | ICD-10-CM

## 2012-11-19 DIAGNOSIS — Z48812 Encounter for surgical aftercare following surgery on the circulatory system: Secondary | ICD-10-CM

## 2012-11-20 ENCOUNTER — Other Ambulatory Visit: Payer: Self-pay | Admitting: *Deleted

## 2012-11-20 DIAGNOSIS — C9110 Chronic lymphocytic leukemia of B-cell type not having achieved remission: Secondary | ICD-10-CM

## 2012-11-20 MED ORDER — ACYCLOVIR 400 MG PO TABS: 400.0000 mg | ORAL_TABLET | Freq: Every day | ORAL | Status: DC

## 2012-11-20 MED ORDER — PROCHLORPERAZINE MALEATE 10 MG PO TABS: 10.0000 mg | ORAL_TABLET | Freq: Four times a day (QID) | ORAL | Status: DC | PRN

## 2012-11-20 MED ORDER — SULFAMETHOXAZOLE-TRIMETHOPRIM 800-160 MG PO TABS: 1.0000 | ORAL_TABLET | ORAL | Status: DC

## 2012-11-21 ENCOUNTER — Ambulatory Visit (HOSPITAL_BASED_OUTPATIENT_CLINIC_OR_DEPARTMENT_OTHER): Payer: BC Managed Care – PPO | Admitting: Oncology

## 2012-11-21 ENCOUNTER — Telehealth: Payer: Self-pay | Admitting: *Deleted

## 2012-11-21 ENCOUNTER — Ambulatory Visit (HOSPITAL_BASED_OUTPATIENT_CLINIC_OR_DEPARTMENT_OTHER): Payer: BC Managed Care – PPO

## 2012-11-21 ENCOUNTER — Encounter: Payer: Self-pay | Admitting: Oncology

## 2012-11-21 ENCOUNTER — Telehealth: Payer: Self-pay | Admitting: Oncology

## 2012-11-21 ENCOUNTER — Other Ambulatory Visit (HOSPITAL_BASED_OUTPATIENT_CLINIC_OR_DEPARTMENT_OTHER): Payer: BC Managed Care – PPO | Admitting: Lab

## 2012-11-21 VITALS — BP 128/67 | HR 71 | Temp 97.7°F | Resp 19 | Ht 72.0 in | Wt 207.5 lb

## 2012-11-21 VITALS — BP 106/61 | HR 51 | Temp 98.1°F | Resp 16

## 2012-11-21 DIAGNOSIS — C9110 Chronic lymphocytic leukemia of B-cell type not having achieved remission: Secondary | ICD-10-CM

## 2012-11-21 DIAGNOSIS — J984 Other disorders of lung: Secondary | ICD-10-CM

## 2012-11-21 DIAGNOSIS — D709 Neutropenia, unspecified: Secondary | ICD-10-CM

## 2012-11-21 DIAGNOSIS — Z5111 Encounter for antineoplastic chemotherapy: Secondary | ICD-10-CM

## 2012-11-21 DIAGNOSIS — Z5112 Encounter for antineoplastic immunotherapy: Secondary | ICD-10-CM

## 2012-11-21 LAB — COMPREHENSIVE METABOLIC PANEL (CC13)
ALT: 54 U/L (ref 0–55)
AST: 20 U/L (ref 5–34)
Albumin: 3.5 g/dL (ref 3.5–5.0)
BUN: 11.9 mg/dL (ref 7.0–26.0)
CO2: 25 mEq/L (ref 22–29)
Calcium: 9.4 mg/dL (ref 8.4–10.4)
Chloride: 105 mEq/L (ref 98–109)
Glucose: 263 mg/dl — ABNORMAL HIGH (ref 70–140)
Potassium: 4.3 mEq/L (ref 3.5–5.1)
Sodium: 139 mEq/L (ref 136–145)
Total Bilirubin: 0.41 mg/dL (ref 0.20–1.20)
Total Protein: 6.2 g/dL — ABNORMAL LOW (ref 6.4–8.3)

## 2012-11-21 LAB — CBC WITH DIFFERENTIAL/PLATELET
Basophils Absolute: 0 10*3/uL (ref 0.0–0.1)
Eosinophils Absolute: 0 10*3/uL (ref 0.0–0.5)
HGB: 14.8 g/dL (ref 13.0–17.1)
MCV: 101.7 fL — ABNORMAL HIGH (ref 79.3–98.0)
MONO#: 0.4 10*3/uL (ref 0.1–0.9)
MONO%: 8.4 % (ref 0.0–14.0)
NEUT#: 3.9 10*3/uL (ref 1.5–6.5)
RDW: 13.9 % (ref 11.0–14.6)

## 2012-11-21 MED ORDER — HEPARIN SOD (PORK) LOCK FLUSH 100 UNIT/ML IV SOLN
500.0000 [IU] | Freq: Once | INTRAVENOUS | Status: AC | PRN
  Administered 2012-11-21: 500 [IU]
  Filled 2012-11-21: qty 5

## 2012-11-21 MED ORDER — DIPHENHYDRAMINE HCL 25 MG PO CAPS
ORAL_CAPSULE | ORAL | Status: AC
  Filled 2012-11-21: qty 2

## 2012-11-21 MED ORDER — SODIUM CHLORIDE 0.9 % IV SOLN
Freq: Once | INTRAVENOUS | Status: AC
  Administered 2012-11-21: 10:00:00 via INTRAVENOUS

## 2012-11-21 MED ORDER — SULFAMETHOXAZOLE-TRIMETHOPRIM 800-160 MG PO TABS: 1.0000 | ORAL_TABLET | ORAL | Status: DC

## 2012-11-21 MED ORDER — ACETAMINOPHEN 325 MG PO TABS
650.0000 mg | ORAL_TABLET | Freq: Once | ORAL | Status: AC
  Administered 2012-11-21: 650 mg via ORAL

## 2012-11-21 MED ORDER — ONDANSETRON 8 MG/50ML IVPB (CHCC)
8.0000 mg | Freq: Once | INTRAVENOUS | Status: AC
  Administered 2012-11-21: 8 mg via INTRAVENOUS

## 2012-11-21 MED ORDER — ACETAMINOPHEN 325 MG PO TABS
ORAL_TABLET | ORAL | Status: AC
  Filled 2012-11-21: qty 2

## 2012-11-21 MED ORDER — SODIUM CHLORIDE 0.9 % IJ SOLN
10.0000 mL | INTRAMUSCULAR | Status: DC | PRN
  Administered 2012-11-21: 10 mL
  Filled 2012-11-21: qty 10

## 2012-11-21 MED ORDER — SODIUM CHLORIDE 0.9 % IV SOLN
25.0000 mg/m2 | Freq: Once | INTRAVENOUS | Status: AC
  Administered 2012-11-21: 57.5 mg via INTRAVENOUS
  Filled 2012-11-21: qty 2.3

## 2012-11-21 MED ORDER — DEXAMETHASONE SODIUM PHOSPHATE 20 MG/5ML IJ SOLN
INTRAMUSCULAR | Status: AC
  Filled 2012-11-21: qty 5

## 2012-11-21 MED ORDER — ACYCLOVIR 400 MG PO TABS: 400.0000 mg | ORAL_TABLET | Freq: Every day | ORAL | Status: DC

## 2012-11-21 MED ORDER — SODIUM CHLORIDE 0.9 % IV SOLN
500.0000 mg/m2 | Freq: Once | INTRAVENOUS | Status: AC
  Administered 2012-11-21: 1100 mg via INTRAVENOUS
  Filled 2012-11-21: qty 110

## 2012-11-21 MED ORDER — ONDANSETRON HCL 8 MG PO TABS
ORAL_TABLET | ORAL | Status: AC
  Filled 2012-11-21: qty 1

## 2012-11-21 MED ORDER — ONDANSETRON 8 MG/NS 50 ML IVPB
INTRAVENOUS | Status: AC
  Filled 2012-11-21: qty 8

## 2012-11-21 MED ORDER — DIPHENHYDRAMINE HCL 25 MG PO CAPS
50.0000 mg | ORAL_CAPSULE | Freq: Once | ORAL | Status: AC
  Administered 2012-11-21: 50 mg via ORAL

## 2012-11-21 MED ORDER — SODIUM CHLORIDE 0.9 % IV SOLN
250.0000 mg/m2 | Freq: Once | INTRAVENOUS | Status: AC
  Administered 2012-11-21: 560 mg via INTRAVENOUS
  Filled 2012-11-21: qty 28

## 2012-11-21 NOTE — Telephone Encounter (Signed)
Per staff message and POF I have scheduled appts.  JMW  

## 2012-11-21 NOTE — Patient Instructions (Signed)
Proceed with cycle 6 of chemotherapy  IVIG on 10/15

## 2012-11-21 NOTE — Patient Instructions (Signed)
[facility] Discharge Instructions for Patients Receiving Chemotherapy  Today you received the following chemotherapy agents:  Rituxan, fludarabine, cytoxan  To help prevent nausea and vomiting after your treatment, we encourage you to take your nausea medication   If you develop nausea and vomiting that is not controlled by your nausea medication, call the clinic.   BELOW ARE SYMPTOMS THAT SHOULD BE REPORTED IMMEDIATELY:  *FEVER GREATER THAN 100.5 F  *CHILLS WITH OR WITHOUT FEVER  NAUSEA AND VOMITING THAT IS NOT CONTROLLED WITH YOUR NAUSEA MEDICATION  *UNUSUAL SHORTNESS OF BREATH  *UNUSUAL BRUISING OR BLEEDING  TENDERNESS IN MOUTH AND THROAT WITH OR WITHOUT PRESENCE OF ULCERS  *URINARY PROBLEMS  *BOWEL PROBLEMS  UNUSUAL RASH Items with * indicate a potential emergency and should be followed up as soon as possible.  Feel free to call the clinic you have any questions or concerns. The clinic phone number is [phone].

## 2012-11-21 NOTE — Telephone Encounter (Signed)
, °

## 2012-11-21 NOTE — Progress Notes (Signed)
OFFICE PROGRESS NOTE  CC  Ryan Copeland [address] [place] [address]  DIAGNOSIS: 62 year old gentleman with stage III CLL with progressive disease.   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 Patient began IVIG q. monthly  CURRENT THERAPY:  Status post cycle #6 of FCR days 1-3 /IVIG q. monthly   INTERVAL HISTORY: Ryan Copeland 62 y.o. male returns for followup visit. Overall patient is doing well. However usually by day 8 he does feel bad. He continues to have a cough it is clear productive. He has no nausea or vomiting no fevers chills or night sweats. Has no bleeding problems. His abdomen is not full is soft. Remainder of the 10 point review of systems is negative.  MEDICAL HISTORY: Past Medical History  Diagnosis Date  . 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 Hyperlipidemia   . Tobacco abuse   . PVD (peripheral vascular disease)   . 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 Diabetes mellitus     ALLERGIES:  is allergic to codeine.  MEDICATIONS:  Current Outpatient Prescriptions  Medication Sig Dispense Refill  . acyclovir (ZOVIRAX) 400 MG tablet Take 1 tablet (400 mg total) by mouth daily.  30 tablet  5  . allopurinol (ZYLOPRIM) 300 MG tablet Take 1 tablet (300 mg total) by mouth daily.  30 tablet  6  . ALPRAZolam (XANAX) 0.25 MG tablet Take 1 po q 6 hours prn for anxiety  90 tablet  1  . aspirin 325 MG tablet Take 325 mg by mouth daily.        . B Complex Vitamins (VITAMIN B COMPLEX IJ) Inject as directed every 30 (thirty) days.      . cyanocobalamin (,VITAMIN B-12,) 1000 MCG/ML injection Inject 1 mL (1,000 mcg total) into the muscle every 30 (thirty) days.  3 mL  1  . dexamethasone (DECADRON) 4 MG tablet Take 2 tablets (8 mg total) by mouth 2 (two) times daily with a meal. Take  daily starting the day after chemotherapy for 2 days. Take with food.  30 tablet  1  . diphenhydrAMINE (BENADRYL) 25 mg capsule Take 50 mg by mouth at bedtime as needed for itching or sleep.      Marland Kitchen esomeprazole (NEXIUM) 40 MG capsule Take 1 capsule (40 mg total) by mouth 2 (two) times daily.  180 capsule  3  . fluticasone (FLOVENT HFA) 110 MCG/ACT inhaler Inhale 1 puff into the lungs 2 (two) times daily.  1 Inhaler  12  . Ipratropium-Albuterol (COMBIVENT) 20-100 MCG/ACT AERS respimat Inhale 1 puff into the lungs every 6 (six) hours.  1 Inhaler  6  . lidocaine-prilocaine (EMLA) cream Apply topically as needed. Place cream on port site 1-2 hours prior to treatment.  30 g  2  . LORazepam (ATIVAN) 0.5 MG tablet Take 1 tablet (0.5 mg total) by mouth every 6 (six) hours as needed (Nausea or vomiting).  30 tablet  0  . metFORMIN (GLUCOPHAGE-XR) 500 MG 24 hr tablet Take 500 mg by mouth 2 (two) times daily.      . metoprolol succinate (TOPROL-XL) 50 MG 24 hr tablet Take 1 tablet (50 mg total) by mouth daily with breakfast. Take with or immediately following a meal.  90 tablet  3  . pregabalin (LYRICA) 75 MG capsule 1 cap three  times daily and 2 caps at bedtime  150 capsule  6  . rosuvastatin (CRESTOR) 5 MG tablet Take 1 tablet (5 mg total) by mouth daily.  30 tablet  3  . sulfamethoxazole-trimethoprim (BACTRIM DS,SEPTRA DS) 800-160 MG per tablet Take 1 tablet by mouth 3 (three) times a week.  12 tablet  5  . tetrahydrozoline 0.05 % ophthalmic solution Place 1 drop into both eyes as needed. Red eyes      . varenicline (CHANTIX CONTINUING MONTH PAK) 1 MG tablet Take 1 tablet (1 mg total) by mouth 2 (two) times daily.  90 tablet  1  . amoxicillin-clavulanate (AUGMENTIN) 875-125 MG per tablet Take 1 tablet by mouth as needed.      . ciprofloxacin (CIPRO) 500 MG tablet Take 500 mg by mouth as needed.      . nitroGLYCERIN (NITROSTAT) 0.4 MG SL tablet Place 1 tablet (0.4 mg total) under the tongue every 5 (five)  minutes as needed.  25 tablet  6  . ondansetron (ZOFRAN) 8 MG tablet Take 1 tablet (8 mg total) by mouth 2 (two) times daily. Take two times a day starting the day after chemo for 2 days. Then take two times a day as needed for nausea or vomiting.  30 tablet  1  . oxyCODONE (OXY IR/ROXICODONE) 5 MG immediate release tablet Take 1 tablet (5 mg total) by mouth every 4 (four) hours as needed for pain.  30 tablet  0  . prasugrel (EFFIENT) 10 MG TABS tablet Take 1 tablet (10 mg total) by mouth daily with breakfast.  90 tablet  3  . prochlorperazine (COMPAZINE) 10 MG tablet Take 1 tablet (10 mg total) by mouth every 6 (six) hours as needed (Nausea or vomiting).  30 tablet  0  . prochlorperazine (COMPAZINE) 25 MG suppository Place 1 suppository (25 mg total) rectally every 12 (twelve) hours as needed for nausea.  12 suppository  3  . topiramate (TOPAMAX) 25 MG capsule Take 1 capsule (25 mg total) by mouth 2 (two) times daily.  180 capsule  1   No current facility-administered medications for this visit.   Facility-Administered Medications Ordered in Other Visits  Medication Dose Route Frequency Provider Last Rate Last Dose  . sodium chloride 0.9 % injection 10 mL  10 mL Intracatheter PRN Victorino December, MD   10 mL at 08/22/12 1721    SURGICAL HISTORY:  Past Surgical History  Procedure Laterality Date  . Femoral stents    . Coronary stent placement  May 2011  . Cholecystectomy  2007    REVIEW OF SYSTEMS:   General: fatigue (-), night sweats (-), fever (-), pain (-) Lymph: palpable nodes (-) HEENT: vision changes (-), mucositis (-), gum bleeding (-), epistaxis (-) Cardiovascular: chest pain (-), palpitations (-) Pulmonary: shortness of breath (-), dyspnea on exertion (-), cough (-), hemoptysis (-) GI:  Early satiety (-), melena (-), dysphagia (-), nausea/vomiting (-), diarrhea (-) GU: dysuria (-), hematuria (-), incontinence (-) Musculoskeletal: joint swelling (-), joint pain (-), back pain  (-) Neuro: weakness (-), numbness (-), headache (-), confusion (-) Skin: Rash (-), lesions (-), dryness (-) Psych: depression (-), suicidal/homicidal ideation (-), feeling of hopelessness (-)   PHYSICAL EXAMINATION:  BP 128/67  Pulse 71  Temp(Src) 97.7 F (36.5 C) (Oral)  Resp 19  Ht 6' (1.829 m)  Wt 207 lb 8 oz (94.121 kg)  BMI 28.14 kg/m2 General: Patient is a well appearing male in no acute distress HEENT: PERRLA, sclerae  anicteric no conjunctival pallor, MMM Neck: supple, no palpable adenopathy Lungs: clear to auscultation bilaterally, no wheezes, rhonchi, or rales Cardiovascular: regular rate rhythm, S1, S2, no murmurs, rubs or gallops Abdomen: Soft, non-tender, non-distended, normoactive bowel sounds, liver is not palpable the splenic tip is palpable  Extremities: warm and well perfused, no clubbing, cyanosis, or edema Skin: No rashes or lesions Neuro: Non-focal ECOG PERFORMANCE STATUS: 0 - Asymptomatic  LABORATORY DATA: Lab Results  Component Value Date   WBC 4.4 11/21/2012   HGB 14.8 11/21/2012   HCT 42.3 11/21/2012   MCV 101.7* 11/21/2012   PLT 47* 11/21/2012      Chemistry      Component Value Date/Time   NA 134* 11/02/2012 1206   NA 137 10/24/2012 0852   K 4.1 11/02/2012 1206   K 4.1 10/24/2012 0852   CL 105 11/02/2012 1206   CL 104 08/03/2012 1229   CO2 24 11/02/2012 1206   CO2 23 10/24/2012 0852   BUN 13 11/02/2012 1206   BUN 13.5 10/24/2012 0852   CREATININE 0.8 11/02/2012 1206   CREATININE 1.0 10/24/2012 0852      Component Value Date/Time   CALCIUM 8.7 11/02/2012 1206   CALCIUM 9.0 10/24/2012 0852   ALKPHOS 82 11/02/2012 1206   ALKPHOS 115 10/24/2012 0852   AST 30 11/02/2012 1206   AST 20 10/24/2012 0852   ALT 74* 11/02/2012 1206   ALT 73* 10/24/2012 0852   BILITOT 0.6 11/02/2012 1206   BILITOT 0.66 10/24/2012 0852    FINAL DIAGNOSIS Diagnosis Bone Marrow, Aspirate,Biopsy, and Clot, right iliac bone - HYPERCELLULAR BONE MARROW WITH EXTENSIVE INVOLVEMENT BY  CHRONIC LYMPHOCYTIC LEUKEMIA. PERIPHERAL BLOOD: - CHRONIC LYMPHOCYTIC LEUKEMIA. [name] MD Pathologist, Electronic Signature (Case signed 11/22/2011) GROSS AND MICROSCOPIC INFORMATION Specimen Clinical Information hx CLL [jl] Source Bone Marrow, Aspirate,Biopsy, and Clot, right iliac bone Microscopic LAB DATA: CBC performed on 11/18/11 shows: WBC 80.87 K/ul Neutrophils 5% HB 15.7 g/dl Lymphocytes 30% HCT 86.5 % Monocytes 1% MCV 96.0 fL Eosinophils 0% RDW 15.4 % Basophils 0% PLT 81 K/ul PERIPHERAL BLOOD SMEAR: The red blood cells display mild anisopoikilocytosis with minimal polychromasia. The white blood cells are markedly increased in number with lymphocytosis. The lymphocytes primarily consist of small lymphoid cells with generally high nuclear cytoplasmic ratio, dense chromatin with block type clumping, and inconspicuous nucleoli. There are numerous smudge cells in the 1 of 3 FINAL for Ryan Copeland, Ryan Copeland [phone]) Microscopic(continued) background. A significant prolymphocytic component is not present. The platelets are decreased in number. BONE MARROW ASPIRATE: Erythroid precursors: Relatively decreased with progressive maturation. Granulocytic precursors: Relatively decreased with progressive maturation. Megakaryocytes: Scattered forms are seen displaying normal morphology. Lymphocytes/plasma cells: The lymphocytes are markedly increased in number representing 95% of all cells and consist of small lymphoid cells with high nuclear cytoplasmic ratio, dense chromatin and inconspicuous nucleoli. A significant prolymphocytic or large lymphoid cell component is not present. Significant plasma cell aggregates are not present. TOUCH PREPARATIONS: Predominance of small lymphoid cells. CLOT and BIOPSY: The sections show 90% cellularity with extensive infiltration of the medullary space by numerous aggregates, interstitial infiltrates and diffuse sheets of primarily small lymphoid  cells displaying high nuclear cytoplasmic ratio, dense chromatin and small to inconspicuous nucleoli. Scattered small proliferation centers are seen. Myeloid hematopoiesis appears decreased in the background. Immunohistochemical stains for CD20, CD79a, CD3, CD5, CD10 and cyclin D1 were performed with appropriate controls. The lymphoid population is stains positively ffor CD79a (B cell marker) with patchy weaker positivity for CD20.  There is patchy weak coexpression of CD5. No CD10 or cyclin D1 expression is identified. IRON STAIN: Iron stains are performed on a bone marrow aspirate smear and section of clot. The controls stained appropriately. Storage Iron: Decreased. Ringed Sideroblasts: Absent. ADDITIONAL DATA / TESTING: Specimen was sent for cytogenetic analysis and a separate report will follow. Flow cytometric analysis of the lymphoid population ([id number]) shows a major B cell population expressing pan B cell antigens including CD20 and CD23 associated with coexpression of CD5. This population shows extremely dim/negative staining for surface immunoglobulin light chains. (BNS:gt, 11/21/11) Specimen Table Bone Marrow count performed on 500 cells shows: Blasts: 0% Myeloid 3% Promyelocyts: 0% Myelocytes: 0% Erythroid 2% Metamyelocyts: 1% Bands: 1% Lymphocytes: 95% Neutrophils: 1% Eosinophils: 0% Plasma Cells: 0% Basophils: 0% Monocytes: 0% M:E ratio: 1.5 Gross Received in [facility] are tissue fragments which aggregate 0.4 x 0.4 x 0.1 cm. The specimen is submitted in toto. Received in a separate container in [facility] are two cores of bone measuring 2.2 and 2.8 cm in length and each 0.2 cm in diameter. The specimen is submitted in toto following decalcification. (GP:eps 11/18/11) 2 of   RADIOGRAPHIC STUDIES: CT CHEST  Findings: Bilateral axillary lymphadenopathy is slightly  progressed compared to prior. For example, index node in the  right axilla measures 15 mm short axis (image  14) compared to 9 mm  on prior. Adjacent rounded 20 mm lymph node (#8) compares to 13 mm  on prior. There are clustered small supraclavicular lymph nodes  which also appears slightly enlarged. Within the mediastinum small  paratracheal lymph nodes are similar. No pericardial fluid.  Esophagus is normal. Coronary artery stent is noted.  Review of the lung parenchyma demonstrates no new suspicious  pulmonary nodules.  IMPRESSION:  1. Mild continued progressive enlargement of axillary  lymphadenopathy.  2. Stable supraclavicular and mediastinal adenopathy.  CT ABDOMEN AND PELVIS  Findings: Retroperitoneal lymphadenopathy is slightly progressed  compared to prior. For example conglomerate of nodes left of the  aorta measures 26 mm short axis compared to 22 mm on prior (image  71). Periportal lymph node measures 23 mm compared to 20 mm on  prior.  The spleen is massively enlarged with a calculated volume of 2362  ml compared to 1972 ml on prior.  Bilateral external iliac lymph nodes are increased in volume. Left  node measures 19mm short axis compared to 13 on prior. Inguinal  adenopathy is also increased.  The liver, liver is normal. Gallbladder is absent. The spleen,  adrenal glands, kidneys are normal.  The colon rectosigmoid colon are normal.  The bladder prostate gland normal. No pelvic fluid.  IMPRESSION:  1. Progressive increase in retroperitoneal, iliac, and inguinal  lymphadenopathy.  2. Interval increase in massive splenomegaly.     ASSESSMENT: 62 year old gentleman with  #1 CLL progressive Now receiving chemotherapy. His physical revealed decrease in his lymphadenopathy in the cervical and axillary area. Spleen is also improved. He is s/p cycle #3 of FCR . He did receive neulasta with cycle 3 of his chemotherapy due to profound neutropenia  #2 dental abscess resolved   #3 chronic pulmonary infection: He has been on prolonged course of oral antibiotics with now resolution  of this.   #4 He is s/p IVIG dose 1 last week on 08/31/12, which he tolerated well. He tells me that his cough and productive sputum has resolved and he feels much better, we will continue IVIG monthly  #5 smoking cessation: Patient is given a prescription  for Chantix.   PLAN:  #1 proceed with cycle 6 of FCR. After completion of chemotherapy I will plan on doing restaging studies. These are ordered for November.  #2 patient is now here for monthly IVIG.  #3 his counts are stable however they do remain low.  #4 he has not had any intercurrent illnesses.  #5 she will return in one week's time for IVIG   All questions were answered. The patient knows to call the clinic with any problems, questions or concerns. We can certainly see the patient much sooner if necessary.  I spent 25 minutes counseling the patient face to face. The total time spent in the appointment was 30 minutes.  Drue Second, MD Medical/Oncology [facility] [phone] (beeper) [phone] (Office)

## 2012-11-22 ENCOUNTER — Ambulatory Visit (HOSPITAL_BASED_OUTPATIENT_CLINIC_OR_DEPARTMENT_OTHER): Payer: BC Managed Care – PPO

## 2012-11-22 DIAGNOSIS — C9110 Chronic lymphocytic leukemia of B-cell type not having achieved remission: Secondary | ICD-10-CM

## 2012-11-22 DIAGNOSIS — Z5111 Encounter for antineoplastic chemotherapy: Secondary | ICD-10-CM

## 2012-11-22 MED ORDER — SODIUM CHLORIDE 0.9 % IV SOLN
25.0000 mg/m2 | Freq: Once | INTRAVENOUS | Status: AC
  Administered 2012-11-22: 57.5 mg via INTRAVENOUS
  Filled 2012-11-22: qty 2.3

## 2012-11-22 MED ORDER — HEPARIN SOD (PORK) LOCK FLUSH 100 UNIT/ML IV SOLN
500.0000 [IU] | Freq: Once | INTRAVENOUS | Status: AC | PRN
  Administered 2012-11-22: 500 [IU]
  Filled 2012-11-22: qty 5

## 2012-11-22 MED ORDER — SODIUM CHLORIDE 0.9 % IJ SOLN
10.0000 mL | INTRAMUSCULAR | Status: DC | PRN
  Administered 2012-11-22: 10 mL
  Filled 2012-11-22: qty 10

## 2012-11-22 MED ORDER — ONDANSETRON 8 MG/50ML IVPB (CHCC)
8.0000 mg | Freq: Once | INTRAVENOUS | Status: AC
  Administered 2012-11-22: 8 mg via INTRAVENOUS

## 2012-11-22 MED ORDER — SODIUM CHLORIDE 0.9 % IV SOLN
250.0000 mg/m2 | Freq: Once | INTRAVENOUS | Status: AC
  Administered 2012-11-22: 560 mg via INTRAVENOUS
  Filled 2012-11-22: qty 28

## 2012-11-22 MED ORDER — SODIUM CHLORIDE 0.9 % IV SOLN
Freq: Once | INTRAVENOUS | Status: AC
  Administered 2012-11-22: 15:00:00 via INTRAVENOUS

## 2012-11-22 MED ORDER — ONDANSETRON 8 MG/NS 50 ML IVPB
INTRAVENOUS | Status: AC
  Filled 2012-11-22: qty 8

## 2012-11-22 NOTE — Patient Instructions (Signed)
[facility] Discharge Instructions for Patients Receiving Chemotherapy  Today you received the following chemotherapy agents Fludara and Cytoxan.  To help prevent nausea and vomiting after your treatment, we encourage you to take your nausea medication as needed.   If you develop nausea and vomiting that is not controlled by your nausea medication, call the clinic.   BELOW ARE SYMPTOMS THAT SHOULD BE REPORTED IMMEDIATELY:  *FEVER GREATER THAN 100.5 F  *CHILLS WITH OR WITHOUT FEVER  NAUSEA AND VOMITING THAT IS NOT CONTROLLED WITH YOUR NAUSEA MEDICATION  *UNUSUAL SHORTNESS OF BREATH  *UNUSUAL BRUISING OR BLEEDING  TENDERNESS IN MOUTH AND THROAT WITH OR WITHOUT PRESENCE OF ULCERS  *URINARY PROBLEMS  *BOWEL PROBLEMS  UNUSUAL RASH Items with * indicate a potential emergency and should be followed up as soon as possible.  Feel free to call the clinic you have any questions or concerns. The clinic phone number is [phone].

## 2012-11-23 ENCOUNTER — Ambulatory Visit (HOSPITAL_BASED_OUTPATIENT_CLINIC_OR_DEPARTMENT_OTHER): Payer: BC Managed Care – PPO

## 2012-11-23 VITALS — BP 120/63 | HR 67 | Temp 97.4°F

## 2012-11-23 DIAGNOSIS — C9110 Chronic lymphocytic leukemia of B-cell type not having achieved remission: Secondary | ICD-10-CM

## 2012-11-23 DIAGNOSIS — Z5111 Encounter for antineoplastic chemotherapy: Secondary | ICD-10-CM

## 2012-11-23 MED ORDER — SODIUM CHLORIDE 0.9 % IV SOLN
25.0000 mg/m2 | Freq: Once | INTRAVENOUS | Status: AC
  Administered 2012-11-23: 57.5 mg via INTRAVENOUS
  Filled 2012-11-23: qty 2.3

## 2012-11-23 MED ORDER — SODIUM CHLORIDE 0.9 % IV SOLN
Freq: Once | INTRAVENOUS | Status: AC
  Administered 2012-11-23: 15:00:00 via INTRAVENOUS

## 2012-11-23 MED ORDER — HEPARIN SOD (PORK) LOCK FLUSH 100 UNIT/ML IV SOLN
500.0000 [IU] | Freq: Once | INTRAVENOUS | Status: AC | PRN
  Administered 2012-11-23: 500 [IU]
  Filled 2012-11-23: qty 5

## 2012-11-23 MED ORDER — ONDANSETRON 8 MG/50ML IVPB (CHCC)
8.0000 mg | Freq: Once | INTRAVENOUS | Status: AC
  Administered 2012-11-23: 8 mg via INTRAVENOUS

## 2012-11-23 MED ORDER — SODIUM CHLORIDE 0.9 % IV SOLN
250.0000 mg/m2 | Freq: Once | INTRAVENOUS | Status: AC
  Administered 2012-11-23: 560 mg via INTRAVENOUS
  Filled 2012-11-23: qty 28

## 2012-11-23 MED ORDER — SODIUM CHLORIDE 0.9 % IJ SOLN
10.0000 mL | INTRAMUSCULAR | Status: DC | PRN
  Administered 2012-11-23: 10 mL
  Filled 2012-11-23: qty 10

## 2012-11-23 MED ORDER — ONDANSETRON 8 MG/NS 50 ML IVPB
INTRAVENOUS | Status: AC
  Filled 2012-11-23: qty 8

## 2012-11-23 NOTE — Patient Instructions (Addendum)
 [facility] Discharge Instructions for Patients Receiving Chemotherapy  Today you received the following chemotherapy agents : Fludara & Cytoxan  To help prevent nausea and vomiting after your treatment, we encourage you to take your nausea medications as directed:  Ativan 0.5 mg every 6 hours as needed  Zofran 8 mg twice daily X 2 days + Decadron 8 mg twice daily X 2 days (start 11/24/12)  Compazine 10 mg every 6 hours as needed   If you develop nausea and vomiting that is not controlled by your nausea medication, call the clinic.   BELOW ARE SYMPTOMS THAT SHOULD BE REPORTED IMMEDIATELY:  *FEVER GREATER THAN 100.5 F  *CHILLS WITH OR WITHOUT FEVER  NAUSEA AND VOMITING THAT IS NOT CONTROLLED WITH YOUR NAUSEA MEDICATION  *UNUSUAL SHORTNESS OF BREATH  *UNUSUAL BRUISING OR BLEEDING  TENDERNESS IN MOUTH AND THROAT WITH OR WITHOUT PRESENCE OF ULCERS  *URINARY PROBLEMS  *BOWEL PROBLEMS  UNUSUAL RASH Items with * indicate a potential emergency and should be followed up as soon as possible.  Feel free to call the clinic you have any questions or concerns. The clinic phone number is [phone].  It has been a pleasure to serve you today !

## 2012-11-24 ENCOUNTER — Ambulatory Visit (HOSPITAL_BASED_OUTPATIENT_CLINIC_OR_DEPARTMENT_OTHER): Payer: BC Managed Care – PPO

## 2012-11-24 VITALS — BP 133/55 | HR 66 | Temp 98.0°F | Resp 18

## 2012-11-24 DIAGNOSIS — C9110 Chronic lymphocytic leukemia of B-cell type not having achieved remission: Secondary | ICD-10-CM

## 2012-11-24 DIAGNOSIS — Z5189 Encounter for other specified aftercare: Secondary | ICD-10-CM

## 2012-11-24 MED ORDER — PEGFILGRASTIM INJECTION 6 MG/0.6ML
6.0000 mg | Freq: Once | SUBCUTANEOUS | Status: AC
  Administered 2012-11-24: 6 mg via SUBCUTANEOUS

## 2012-11-28 ENCOUNTER — Other Ambulatory Visit (HOSPITAL_BASED_OUTPATIENT_CLINIC_OR_DEPARTMENT_OTHER): Payer: BC Managed Care – PPO | Admitting: Lab

## 2012-11-28 ENCOUNTER — Ambulatory Visit (HOSPITAL_BASED_OUTPATIENT_CLINIC_OR_DEPARTMENT_OTHER): Payer: BC Managed Care – PPO

## 2012-11-28 ENCOUNTER — Encounter: Payer: Self-pay | Admitting: Oncology

## 2012-11-28 ENCOUNTER — Ambulatory Visit (HOSPITAL_BASED_OUTPATIENT_CLINIC_OR_DEPARTMENT_OTHER): Payer: BC Managed Care – PPO | Admitting: Oncology

## 2012-11-28 VITALS — BP 122/74 | HR 80 | Temp 97.9°F | Resp 20 | Ht 72.0 in | Wt 206.3 lb

## 2012-11-28 VITALS — BP 108/61 | HR 58 | Temp 97.8°F | Resp 18

## 2012-11-28 DIAGNOSIS — C9110 Chronic lymphocytic leukemia of B-cell type not having achieved remission: Secondary | ICD-10-CM

## 2012-11-28 DIAGNOSIS — F17200 Nicotine dependence, unspecified, uncomplicated: Secondary | ICD-10-CM

## 2012-11-28 LAB — COMPREHENSIVE METABOLIC PANEL (CC13)
AST: 12 U/L (ref 5–34)
Albumin: 3.4 g/dL — ABNORMAL LOW (ref 3.5–5.0)
Alkaline Phosphatase: 116 U/L (ref 40–150)
BUN: 19.8 mg/dL (ref 7.0–26.0)
Calcium: 9 mg/dL (ref 8.4–10.4)
Creatinine: 0.9 mg/dL (ref 0.7–1.3)
Glucose: 345 mg/dl — ABNORMAL HIGH (ref 70–140)
Potassium: 3.7 mEq/L (ref 3.5–5.1)
Sodium: 138 mEq/L (ref 136–145)
Total Bilirubin: 0.63 mg/dL (ref 0.20–1.20)

## 2012-11-28 LAB — CBC WITH DIFFERENTIAL/PLATELET
BASO%: 2 % (ref 0.0–2.0)
Basophils Absolute: 0 10*3/uL (ref 0.0–0.1)
EOS%: 0.5 % (ref 0.0–7.0)
HCT: 39 % (ref 38.4–49.9)
HGB: 13.7 g/dL (ref 13.0–17.1)
LYMPH%: 4 % — ABNORMAL LOW (ref 14.0–49.0)
MCH: 35.4 pg — ABNORMAL HIGH (ref 27.2–33.4)
MCHC: 35.1 g/dL (ref 32.0–36.0)
MCV: 100.8 fL — ABNORMAL HIGH (ref 79.3–98.0)
NEUT%: 82.6 % — ABNORMAL HIGH (ref 39.0–75.0)
Platelets: 24 10*3/uL — ABNORMAL LOW (ref 140–400)
RDW: 13.5 % (ref 11.0–14.6)
lymph#: 0.1 10*3/uL — ABNORMAL LOW (ref 0.9–3.3)

## 2012-11-28 MED ORDER — IMMUNE GLOBULIN (HUMAN) 10 GM/100ML IV SOLN: 0.5000 g/kg | Freq: Once | INTRAVENOUS | Status: DC

## 2012-11-28 MED ORDER — DIPHENHYDRAMINE HCL 25 MG PO CAPS
25.0000 mg | ORAL_CAPSULE | Freq: Once | ORAL | Status: AC
  Administered 2012-11-28: 25 mg via ORAL

## 2012-11-28 MED ORDER — ACETAMINOPHEN 325 MG PO TABS
ORAL_TABLET | ORAL | Status: AC
  Filled 2012-11-28: qty 2

## 2012-11-28 MED ORDER — SODIUM CHLORIDE 0.9 % IJ SOLN
10.0000 mL | Freq: Once | INTRAMUSCULAR | Status: AC
  Administered 2012-11-28: 10 mL
  Filled 2012-11-28: qty 10

## 2012-11-28 MED ORDER — DIPHENHYDRAMINE HCL 25 MG PO CAPS
ORAL_CAPSULE | ORAL | Status: AC
  Filled 2012-11-28: qty 1

## 2012-11-28 MED ORDER — SODIUM CHLORIDE 0.9 % IV SOLN
Freq: Once | INTRAVENOUS | Status: AC
  Administered 2012-11-28: 10:00:00 via INTRAVENOUS

## 2012-11-28 MED ORDER — HEPARIN SOD (PORK) LOCK FLUSH 100 UNIT/ML IV SOLN
500.0000 [IU] | Freq: Once | INTRAVENOUS | Status: AC
  Administered 2012-11-28: 500 [IU] via INTRAVENOUS
  Filled 2012-11-28: qty 5

## 2012-11-28 MED ORDER — AZITHROMYCIN 250 MG PO TABS: ORAL_TABLET | ORAL | Status: DC

## 2012-11-28 MED ORDER — IMMUNE GLOBULIN (HUMAN) 5 GM/100ML IV SOLN
400.0000 mg/kg | Freq: Once | INTRAVENOUS | Status: AC
  Administered 2012-11-28: 11:00:00 35 g via INTRAVENOUS
  Filled 2012-11-28: qty 100

## 2012-11-28 MED ORDER — ACETAMINOPHEN 325 MG PO TABS
650.0000 mg | ORAL_TABLET | Freq: Once | ORAL | Status: AC
  Administered 2012-11-28: 650 mg via ORAL

## 2012-11-28 NOTE — Patient Instructions (Signed)

## 2012-11-28 NOTE — Progress Notes (Signed)
OFFICE PROGRESS NOTE  CC  Ryan Copeland [address] [place] [address]  DIAGNOSIS: 62 year old gentleman with stage III CLL with progressive disease.   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 Patient began IVIG q. monthly  CURRENT THERAPY:  Status post cycle #6 of FCR days 1-3 /IVIG q. monthly   INTERVAL HISTORY: Ryan Copeland 62 y.o. male returns for followup visit. Overall patient is doing well. However usually by day 8 he does feel bad. He continues to have a cough it is clear productive. He has no nausea or vomiting no fevers chills or night sweats. Has no bleeding problems. His abdomen is not full is soft. Remainder of the 10 point review of systems is negative.  MEDICAL HISTORY: Past Medical History  Diagnosis Date  . 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 Hyperlipidemia   . Tobacco abuse   . PVD (peripheral vascular disease)   . 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 Diabetes mellitus     ALLERGIES:  is allergic to codeine.  MEDICATIONS:  Current Outpatient Prescriptions  Medication Sig Dispense Refill  . acyclovir (ZOVIRAX) 400 MG tablet Take 1 tablet (400 mg total) by mouth daily.  30 tablet  5  . ALPRAZolam (XANAX) 0.25 MG tablet Take 1 po q 6 hours prn for anxiety  90 tablet  1  . aspirin 325 MG tablet Take 325 mg by mouth daily.        . B Complex Vitamins (VITAMIN B COMPLEX IJ) Inject as directed every 30 (thirty) days.      . ciprofloxacin (CIPRO) 500 MG tablet Take 500 mg by mouth as needed.      . cyanocobalamin (,VITAMIN B-12,) 1000 MCG/ML injection Inject 1 mL (1,000 mcg total) into the muscle every 30 (thirty) days.  3 mL  1  . dexamethasone (DECADRON) 4 MG tablet Take 2 tablets (8 mg total) by mouth 2 (two) times daily with a meal. Take daily starting the day  after chemotherapy for 2 days. Take with food.  30 tablet  1  . diphenhydrAMINE (BENADRYL) 25 mg capsule Take 50 mg by mouth at bedtime as needed for itching or sleep.      Marland Kitchen esomeprazole (NEXIUM) 40 MG capsule Take 1 capsule (40 mg total) by mouth 2 (two) times daily.  180 capsule  3  . fluticasone (FLOVENT HFA) 110 MCG/ACT inhaler Inhale 1 puff into the lungs 2 (two) times daily.  1 Inhaler  12  . Ipratropium-Albuterol (COMBIVENT) 20-100 MCG/ACT AERS respimat Inhale 1 puff into the lungs every 6 (six) hours.  1 Inhaler  6  . lidocaine-prilocaine (EMLA) cream Apply topically as needed. Place cream on port site 1-2 hours prior to treatment.  30 g  2  . LORazepam (ATIVAN) 0.5 MG tablet Take 1 tablet (0.5 mg total) by mouth every 6 (six) hours as needed (Nausea or vomiting).  30 tablet  0  . metFORMIN (GLUCOPHAGE-XR) 500 MG 24 hr tablet Take 500 mg by mouth 2 (two) times daily.      . metoprolol succinate (TOPROL-XL) 50 MG 24 hr tablet Take 1 tablet (50 mg total) by mouth daily with breakfast. Take with or immediately following a meal.  90 tablet  3  . nitroGLYCERIN (NITROSTAT) 0.4 MG SL tablet Place 1 tablet (0.4 mg  total) under the tongue every 5 (five) minutes as needed.  25 tablet  6  . ondansetron (ZOFRAN) 8 MG tablet Take 1 tablet (8 mg total) by mouth 2 (two) times daily. Take two times a day starting the day after chemo for 2 days. Then take two times a day as needed for nausea or vomiting.  30 tablet  1  . oxyCODONE (OXY IR/ROXICODONE) 5 MG immediate release tablet Take 1 tablet (5 mg total) by mouth every 4 (four) hours as needed for pain.  30 tablet  0  . prasugrel (EFFIENT) 10 MG TABS tablet Take 1 tablet (10 mg total) by mouth daily with breakfast.  90 tablet  3  . pregabalin (LYRICA) 75 MG capsule 1 cap three times daily and 2 caps at bedtime  150 capsule  6  . prochlorperazine (COMPAZINE) 10 MG tablet Take 1 tablet (10 mg total) by mouth every 6 (six) hours as needed (Nausea or vomiting).   30 tablet  0  . prochlorperazine (COMPAZINE) 25 MG suppository Place 1 suppository (25 mg total) rectally every 12 (twelve) hours as needed for nausea.  12 suppository  3  . rosuvastatin (CRESTOR) 5 MG tablet Take 1 tablet (5 mg total) by mouth daily.  30 tablet  3  . sulfamethoxazole-trimethoprim (BACTRIM DS,SEPTRA DS) 800-160 MG per tablet Take 1 tablet by mouth 3 (three) times a week.  12 tablet  5  . tetrahydrozoline 0.05 % ophthalmic solution Place 1 drop into both eyes as needed. Red eyes      . topiramate (TOPAMAX) 25 MG capsule Take 1 capsule (25 mg total) by mouth 2 (two) times daily.  180 capsule  1  . varenicline (CHANTIX CONTINUING MONTH PAK) 1 MG tablet Take 1 tablet (1 mg total) by mouth 2 (two) times daily.  90 tablet  1   No current facility-administered medications for this visit.   Facility-Administered Medications Ordered in Other Visits  Medication Dose Route Frequency Provider Last Rate Last Dose  . sodium chloride 0.9 % injection 10 mL  10 mL Intracatheter PRN Victorino December, MD   10 mL at 08/22/12 1721    SURGICAL HISTORY:  Past Surgical History  Procedure Laterality Date  . Femoral stents    . Coronary stent placement  May 2011  . Cholecystectomy  2007    REVIEW OF SYSTEMS:   General: fatigue (-), night sweats (-), fever (-), pain (-) Lymph: palpable nodes (-) HEENT: vision changes (-), mucositis (-), gum bleeding (-), epistaxis (-) Cardiovascular: chest pain (-), palpitations (-) Pulmonary: shortness of breath (-), dyspnea on exertion (-), cough (-), hemoptysis (-) GI:  Early satiety (-), melena (-), dysphagia (-), nausea/vomiting (-), diarrhea (-) GU: dysuria (-), hematuria (-), incontinence (-) Musculoskeletal: joint swelling (-), joint pain (-), back pain (-) Neuro: weakness (-), numbness (-), headache (-), confusion (-) Skin: Rash (-), lesions (-), dryness (-) Psych: depression (-), suicidal/homicidal ideation (-), feeling of hopelessness  (-)   PHYSICAL EXAMINATION:  BP 122/74  Pulse 80  Temp(Src) 97.9 F (36.6 C) (Oral)  Resp 20  Ht 6' (1.829 m)  Wt 206 lb 4.8 oz (93.577 kg)  BMI 27.97 kg/m2 General: Patient is a well appearing male in no acute distress HEENT: PERRLA, sclerae anicteric no conjunctival pallor, MMM Neck: supple, no palpable adenopathy Lungs: clear to auscultation bilaterally, no wheezes, rhonchi, or rales Cardiovascular: regular rate rhythm, S1, S2, no murmurs, rubs or gallops Abdomen: Soft, non-tender, non-distended, normoactive bowel sounds, liver is  not palpable the splenic tip is palpable  Extremities: warm and well perfused, no clubbing, cyanosis, or edema Skin: No rashes or lesions Neuro: Non-focal ECOG PERFORMANCE STATUS: 0 - Asymptomatic  LABORATORY DATA: Lab Results  Component Value Date   WBC 2.0* 11/28/2012   HGB 13.7 11/28/2012   HCT 39.0 11/28/2012   MCV 100.8* 11/28/2012   PLT 24* 11/28/2012      Chemistry      Component Value Date/Time   NA 139 11/21/2012 0817   NA 134* 11/02/2012 1206   K 4.3 11/21/2012 0817   K 4.1 11/02/2012 1206   CL 105 11/02/2012 1206   CL 104 08/03/2012 1229   CO2 25 11/21/2012 0817   CO2 24 11/02/2012 1206   BUN 11.9 11/21/2012 0817   BUN 13 11/02/2012 1206   CREATININE 0.9 11/21/2012 0817   CREATININE 0.8 11/02/2012 1206      Component Value Date/Time   CALCIUM 9.4 11/21/2012 0817   CALCIUM 8.7 11/02/2012 1206   ALKPHOS 96 11/21/2012 0817   ALKPHOS 82 11/02/2012 1206   AST 20 11/21/2012 0817   AST 30 11/02/2012 1206   ALT 54 11/21/2012 0817   ALT 74* 11/02/2012 1206   BILITOT 0.41 11/21/2012 0817   BILITOT 0.6 11/02/2012 1206    FINAL DIAGNOSIS Diagnosis Bone Marrow, Aspirate,Biopsy, and Clot, right iliac bone - HYPERCELLULAR BONE MARROW WITH EXTENSIVE INVOLVEMENT BY CHRONIC LYMPHOCYTIC LEUKEMIA. PERIPHERAL BLOOD: - CHRONIC LYMPHOCYTIC LEUKEMIA. [name] MD Pathologist, Electronic Signature (Case signed 11/22/2011) GROSS AND MICROSCOPIC  INFORMATION Specimen Clinical Information hx CLL [jl] Source Bone Marrow, Aspirate,Biopsy, and Clot, right iliac bone Microscopic LAB DATA: CBC performed on 11/18/11 shows: WBC 80.87 K/ul Neutrophils 5% HB 15.7 g/dl Lymphocytes 16% HCT 10.9 % Monocytes 1% MCV 96.0 fL Eosinophils 0% RDW 15.4 % Basophils 0% PLT 81 K/ul PERIPHERAL BLOOD SMEAR: The red blood cells display mild anisopoikilocytosis with minimal polychromasia. The white blood cells are markedly increased in number with lymphocytosis. The lymphocytes primarily consist of small lymphoid cells with generally high nuclear cytoplasmic ratio, dense chromatin with block type clumping, and inconspicuous nucleoli. There are numerous smudge cells in the 1 of 3 FINAL for BURK, HOCTOR [id number]) Microscopic(continued) background. A significant prolymphocytic component is not present. The platelets are decreased in number. BONE MARROW ASPIRATE: Erythroid precursors: Relatively decreased with progressive maturation. Granulocytic precursors: Relatively decreased with progressive maturation. Megakaryocytes: Scattered forms are seen displaying normal morphology. Lymphocytes/plasma cells: The lymphocytes are markedly increased in number representing 95% of all cells and consist of small lymphoid cells with high nuclear cytoplasmic ratio, dense chromatin and inconspicuous nucleoli. A significant prolymphocytic or large lymphoid cell component is not present. Significant plasma cell aggregates are not present. TOUCH PREPARATIONS: Predominance of small lymphoid cells. CLOT and BIOPSY: The sections show 90% cellularity with extensive infiltration of the medullary space by numerous aggregates, interstitial infiltrates and diffuse sheets of primarily small lymphoid cells displaying high nuclear cytoplasmic ratio, dense chromatin and small to inconspicuous nucleoli. Scattered small proliferation centers are seen. Myeloid hematopoiesis appears  decreased in the background. Immunohistochemical stains for CD20, CD79a, CD3, CD5, CD10 and cyclin D1 were performed with appropriate controls. The lymphoid population is stains positively ffor CD79a (B cell marker) with patchy weaker positivity for CD20. There is patchy weak coexpression of CD5. No CD10 or cyclin D1 expression is identified. IRON STAIN: Iron stains are performed on a bone marrow aspirate smear and section of clot. The controls stained appropriately. Storage Iron: Decreased. Ringed Sideroblasts:  Absent. ADDITIONAL DATA / TESTING: Specimen was sent for cytogenetic analysis and a separate report will follow. Flow cytometric analysis of the lymphoid population ([id number]) shows a major B cell population expressing pan B cell antigens including CD20 and CD23 associated with coexpression of CD5. This population shows extremely dim/negative staining for surface immunoglobulin light chains. (BNS:gt, 11/21/11) Specimen Table Bone Marrow count performed on 500 cells shows: Blasts: 0% Myeloid 3% Promyelocyts: 0% Myelocytes: 0% Erythroid 2% Metamyelocyts: 1% Bands: 1% Lymphocytes: 95% Neutrophils: 1% Eosinophils: 0% Plasma Cells: 0% Basophils: 0% Monocytes: 0% M:E ratio: 1.5 Gross Received in [facility] are tissue fragments which aggregate 0.4 x 0.4 x 0.1 cm. The specimen is submitted in toto. Received in a separate container in [facility] are two cores of bone measuring 2.2 and 2.8 cm in length and each 0.2 cm in diameter. The specimen is submitted in toto following decalcification. (GP:eps 11/18/11) 2 of   RADIOGRAPHIC STUDIES: CT CHEST  Findings: Bilateral axillary lymphadenopathy is slightly  progressed compared to prior. For example, index node in the  right axilla measures 15 mm short axis (image 14) compared to 9 mm  on prior. Adjacent rounded 20 mm lymph node (#8) compares to 13 mm  on prior. There are clustered small supraclavicular lymph nodes  which also appears  slightly enlarged. Within the mediastinum small  paratracheal lymph nodes are similar. No pericardial fluid.  Esophagus is normal. Coronary artery stent is noted.  Review of the lung parenchyma demonstrates no new suspicious  pulmonary nodules.  IMPRESSION:  1. Mild continued progressive enlargement of axillary  lymphadenopathy.  2. Stable supraclavicular and mediastinal adenopathy.  CT ABDOMEN AND PELVIS  Findings: Retroperitoneal lymphadenopathy is slightly progressed  compared to prior. For example conglomerate of nodes left of the  aorta measures 26 mm short axis compared to 22 mm on prior (image  71). Periportal lymph node measures 23 mm compared to 20 mm on  prior.  The spleen is massively enlarged with a calculated volume of 2362  ml compared to 1972 ml on prior.  Bilateral external iliac lymph nodes are increased in volume. Left  node measures 19mm short axis compared to 13 on prior. Inguinal  adenopathy is also increased.  The liver, liver is normal. Gallbladder is absent. The spleen,  adrenal glands, kidneys are normal.  The colon rectosigmoid colon are normal.  The bladder prostate gland normal. No pelvic fluid.  IMPRESSION:  1. Progressive increase in retroperitoneal, iliac, and inguinal  lymphadenopathy.  2. Interval increase in massive splenomegaly.     ASSESSMENT: 62 year old gentleman with  #1 CLL progressive Now receiving chemotherapy. His physical revealed decrease in his lymphadenopathy in the cervical and axillary area. Spleen is also improved. He is s/p cycle #3 of FCR . He did receive neulasta with cycle 3 of his chemotherapy due to profound neutropenia  #2 dental abscess resolved   #3 chronic pulmonary infection: He has been on prolonged course of oral antibiotics with now resolution of this.   #4 He is s/p IVIG dose 1 last week on 08/31/12, which he tolerated well. He tells me that his cough and productive sputum has resolved and he feels much better, we  will continue IVIG monthly  #5 smoking cessation: Patient is given a prescription for Chantix.   PLAN:  #1 s/p cycle 6 of FCR. After completion of chemotherapy I will plan on doing restaging studies. These are ordered for November.  #2 patient is now here for monthly IVIG.  #3 his  counts are stable however they do remain low.  #4 he has not had any intercurrent illnesses.    All questions were answered. The patient knows to call the clinic with any problems, questions or concerns. We can certainly see the patient much sooner if necessary.  I spent 25 minutes counseling the patient face to face. The total time spent in the appointment was 30 minutes.  Drue Second, MD Medical/Oncology [facility] [phone] (beeper) [phone] (Office)

## 2012-11-30 ENCOUNTER — Other Ambulatory Visit (INDEPENDENT_AMBULATORY_CARE_PROVIDER_SITE_OTHER): Payer: BC Managed Care – PPO

## 2012-11-30 DIAGNOSIS — C9110 Chronic lymphocytic leukemia of B-cell type not having achieved remission: Secondary | ICD-10-CM

## 2012-11-30 DIAGNOSIS — E785 Hyperlipidemia, unspecified: Secondary | ICD-10-CM

## 2012-11-30 LAB — LIPID PANEL
Cholesterol: 93 mg/dL (ref 0–200)
HDL: 45 mg/dL (ref >39.00)
LDL Cholesterol: 40 mg/dL (ref 0–99)
Total CHOL/HDL Ratio: 2
Triglycerides: 39 mg/dL (ref 0.0–149.0)
VLDL: 7.8 mg/dL (ref 0.0–40.0)

## 2012-11-30 LAB — HEPATIC FUNCTION PANEL
ALT: 44 U/L (ref 0–53)
AST: 18 U/L (ref 0–37)
Albumin: 3.7 g/dL (ref 3.5–5.2)
Alkaline Phosphatase: 95 U/L (ref 39–117)
Bilirubin, Direct: 0.2 mg/dL (ref 0.0–0.3)
Total Bilirubin: 0.5 mg/dL (ref 0.3–1.2)
Total Protein: 6.4 g/dL (ref 6.0–8.3)

## 2012-12-07 ENCOUNTER — Encounter: Payer: Self-pay | Admitting: Surgery

## 2012-12-10 ENCOUNTER — Ambulatory Visit (HOSPITAL_COMMUNITY)
Admission: RE | Admit: 2012-12-10 | Discharge: 2012-12-10 | Disposition: A | Discharge status: Home / Self Care | Payer: BC Managed Care – PPO | Source: Ambulatory Visit | Attending: Surgery | Admitting: Surgery

## 2012-12-10 ENCOUNTER — Encounter: Payer: Self-pay | Admitting: Surgery

## 2012-12-10 ENCOUNTER — Ambulatory Visit (INDEPENDENT_AMBULATORY_CARE_PROVIDER_SITE_OTHER): Payer: BC Managed Care – PPO | Admitting: Surgery

## 2012-12-10 ENCOUNTER — Ambulatory Visit (INDEPENDENT_AMBULATORY_CARE_PROVIDER_SITE_OTHER)
Admission: RE | Admit: 2012-12-10 | Discharge: 2012-12-10 | Disposition: A | Discharge status: Home / Self Care | Payer: BC Managed Care – PPO | Source: Ambulatory Visit

## 2012-12-10 VITALS — BP 129/62 | HR 58 | Resp 18 | Ht 72.0 in | Wt 199.3 lb

## 2012-12-10 DIAGNOSIS — C9110 Chronic lymphocytic leukemia of B-cell type not having achieved remission: Secondary | ICD-10-CM

## 2012-12-10 DIAGNOSIS — I739 Peripheral vascular disease, unspecified: Secondary | ICD-10-CM | POA: Insufficient documentation

## 2012-12-10 DIAGNOSIS — H9319 Tinnitus, unspecified ear: Secondary | ICD-10-CM

## 2012-12-10 DIAGNOSIS — Z48812 Encounter for surgical aftercare following surgery on the circulatory system: Secondary | ICD-10-CM

## 2012-12-10 DIAGNOSIS — H9313 Tinnitus, bilateral: Secondary | ICD-10-CM

## 2012-12-10 NOTE — Progress Notes (Signed)
[facility] [place]   Patient name: Ryan Copeland MRN: [id number] DOB: 1950/06/19 Sex: male     Chief Complaint  Patient presents with  . Follow-up    ABIs and left aorto-iliac duplex   . PVD    HISTORY OF PRESENT ILLNESS: The patient comes in today for followup.  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  He is a former patient of Dr. Madilyn Fireman.  Dr. Madilyn Fireman did this last intervention and in June of 2010.  He used a 10 x 40 Powerflex balloon.  In February of last year and a increase in velocities within his stent were detected.  I scheduled him for angiography to further evaluate this.  We were trying to accommodate the patient's work schedule.  Unfortunately, this procedure never was performed.  The patient has no symptoms of claudication today.  His only complaint is that of hearing a pulse in his care at night.  This has been going on for 2-3 months.  He describes no neurologic symptoms.  Past Medical History  Diagnosis Date  . 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 Hyperlipidemia   . Tobacco abuse   . PVD (peripheral vascular disease)   . 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 Diabetes mellitus     Past Surgical History  Procedure Laterality Date  . Femoral stents    . Coronary stent placement  May 2011  . Cholecystectomy  2007  . Carpel tunnel release Left [id number]  . Carpel tunnel release  Right 01-1989  . Tarsal tunnel release Bilateral 08-2007    History   Social History  . Marital Status: Married    Spouse Name: N/A    Number of Children: N/A  . Years of Education: N/A   Occupational History  . Not on file.   Social History Main Topics  . Smoking status: Current Every Day Smoker -- 1.00 packs/day for 43 years    Types: Cigarettes  . Smokeless tobacco: Never Used  . Alcohol Use: No     Comment:  drinks non-alcoholic beer  . Drug  Use: No  . Sexual Activity: Yes   Other Topics Concern  . Not on file   Social History Narrative  . No narrative on file    Family History  Problem Relation Age of Onset  . Lung cancer Mother 45  . Cancer Mother     lung  . Heart failure Father 93  . Heart disease Father     Allergies as of 12/10/2012 - Review Complete 12/10/2012  Allergen Reaction Noted  . Codeine Hives 09/27/2008    Current Outpatient Prescriptions on File Prior to Visit  Medication Sig Dispense Refill  . acyclovir (ZOVIRAX) 400 MG tablet Take 1 tablet (400 mg total) by mouth daily.  30 tablet  5  . ALPRAZolam (XANAX) 0.25 MG tablet Take 1 po q 6 hours prn for anxiety  90 tablet  1  . aspirin 325 MG tablet Take 325 mg by mouth daily.        . B Complex Vitamins (VITAMIN B COMPLEX IJ) Inject as directed every 30 (thirty) days.      . ciprofloxacin (CIPRO) 500 MG tablet Take 500 mg by mouth as needed.      . cyanocobalamin (,VITAMIN B-12,) 1000 MCG/ML injection Inject 1 mL (1,000 mcg total) into the muscle every 30 (  thirty) days.  3 mL  1  . diphenhydrAMINE (BENADRYL) 25 mg capsule Take 50 mg by mouth at bedtime as needed for itching or sleep.      Marland Kitchen esomeprazole (NEXIUM) 40 MG capsule Take 1 capsule (40 mg total) by mouth 2 (two) times daily.  180 capsule  3  . fluticasone (FLOVENT HFA) 110 MCG/ACT inhaler Inhale 1 puff into the lungs 2 (two) times daily.  1 Inhaler  12  . Ipratropium-Albuterol (COMBIVENT) 20-100 MCG/ACT AERS respimat Inhale 1 puff into the lungs every 6 (six) hours.  1 Inhaler  6  . lidocaine-prilocaine (EMLA) cream Apply topically as needed. Place cream on port site 1-2 hours prior to treatment.  30 g  2  . LORazepam (ATIVAN) 0.5 MG tablet Take 1 tablet (0.5 mg total) by mouth every 6 (six) hours as needed (Nausea or vomiting).  30 tablet  0  . metFORMIN (GLUCOPHAGE-XR) 500 MG 24 hr tablet Take 500 mg by mouth 2 (two) times daily.      . metoprolol succinate (TOPROL-XL) 50 MG 24 hr tablet  Take 1 tablet (50 mg total) by mouth daily with breakfast. Take with or immediately following a meal.  90 tablet  3  . nitroGLYCERIN (NITROSTAT) 0.4 MG SL tablet Place 1 tablet (0.4 mg total) under the tongue every 5 (five) minutes as needed.  25 tablet  6  . oxyCODONE (OXY IR/ROXICODONE) 5 MG immediate release tablet Take 1 tablet (5 mg total) by mouth every 4 (four) hours as needed for pain.  30 tablet  0  . pregabalin (LYRICA) 75 MG capsule 1 cap three times daily and 2 caps at bedtime  150 capsule  6  . rosuvastatin (CRESTOR) 5 MG tablet Take 1 tablet (5 mg total) by mouth daily.  30 tablet  3  . sulfamethoxazole-trimethoprim (BACTRIM DS,SEPTRA DS) 800-160 MG per tablet Take 1 tablet by mouth 3 (three) times a week.  12 tablet  5  . tetrahydrozoline 0.05 % ophthalmic solution Place 1 drop into both eyes as needed. Red eyes      . topiramate (TOPAMAX) 25 MG capsule Take 1 capsule (25 mg total) by mouth 2 (two) times daily.  180 capsule  1  . varenicline (CHANTIX CONTINUING MONTH PAK) 1 MG tablet Take 1 tablet (1 mg total) by mouth 2 (two) times daily.  90 tablet  1  . azithromycin (ZITHROMAX Z-PAK) 250 MG tablet Take 2 today then 1 a day until finished  6 each  0  . dexamethasone (DECADRON) 4 MG tablet Take 2 tablets (8 mg total) by mouth 2 (two) times daily with a meal. Take daily starting the day after chemotherapy for 2 days. Take with food.  30 tablet  1  . ondansetron (ZOFRAN) 8 MG tablet Take 1 tablet (8 mg total) by mouth 2 (two) times daily. Take two times a day starting the day after chemo for 2 days. Then take two times a day as needed for nausea or vomiting.  30 tablet  1  . prasugrel (EFFIENT) 10 MG TABS tablet Take 1 tablet (10 mg total) by mouth daily with breakfast.  90 tablet  3  . prochlorperazine (COMPAZINE) 10 MG tablet Take 1 tablet (10 mg total) by mouth every 6 (six) hours as needed (Nausea or vomiting).  30 tablet  0  . prochlorperazine (COMPAZINE) 25 MG suppository Place 1  suppository (25 mg total) rectally every 12 (twelve) hours as needed for nausea.  12 suppository  3   Current Facility-Administered Medications on File Prior to Visit  Medication Dose Route Frequency Provider Last Rate Last Dose  . sodium chloride 0.9 % injection 10 mL  10 mL Intracatheter PRN Victorino December, MD   10 mL at 08/22/12 1721     REVIEW OF SYSTEMS: Please see history of present illness, otherwise negative  PHYSICAL EXAMINATION:   Vital signs are BP 129/62  Pulse 58  Resp 18  Ht 6' (1.829 m)  Wt 199 lb 4.8 oz (90.402 kg)  BMI 27.02 kg/m2 General: The patient appears their stated age. HEENT:  No gross abnormalities Pulmonary:  Non labored breathing Musculoskeletal: There are no major deformities. Neurologic: No focal weakness or paresthesias are detected, Skin: There are no ulcer or rashes noted. Psychiatric: The patient has normal affect. Cardiovascular: There is a regular rate and rhythm without significant murmur appreciated.  No carotid bruits.  I cannot palpate pedal pulses today.   Diagnostic Studies Duplex ultrasound was ordered and reviewed today.  This shows an ABI of 1.1 on the right and 1.3 on the left.  Both waveforms are triphasic.  Duplex of the stent could not identify previously elevated velocities.  Assessment: Peripheral vascular disease Pulsatile tinnitus Plan: Vascular disease: The patient does not have symptoms of claudication.  Ultrasound could not identify in-stent stenosis today.  He has triphasic waveforms are normal ankle-brachial indices.  Therefore, I recommended yearly surveillance.  Pulsatile tinnitus.  The patient states he has been hearing a ringing in his ear for approximately 2 months.  He is scheduled for a CT scan of the neck, chest, abdomen and pelvis on November 5 for followup of his CLL.  I told the patient I would modify these protocol so as to better evaluate the internal carotid artery in the neck and head.  I modified and so  that they would be CT angiogram of the head, neck, chest, abdomen and pelvis.  I will make sure that he gets oral contrast as well so as to better evaluate him from a malignancy standpoint.  He will followup in my office after the scans have been done.  Jorge Ny, M.D. [facility] [place] Office: [phone] Pager:  [phone]

## 2012-12-11 NOTE — Addendum Note (Signed)
Addended by: Sharee Pimple on: 12/11/2012 08:38 AM   Modules accepted: Orders

## 2012-12-12 ENCOUNTER — Other Ambulatory Visit: Payer: Self-pay | Admitting: *Deleted

## 2012-12-12 DIAGNOSIS — Z48812 Encounter for surgical aftercare following surgery on the circulatory system: Secondary | ICD-10-CM

## 2012-12-12 DIAGNOSIS — I739 Peripheral vascular disease, unspecified: Secondary | ICD-10-CM

## 2012-12-13 ENCOUNTER — Other Ambulatory Visit: Payer: Self-pay | Admitting: *Deleted

## 2012-12-13 ENCOUNTER — Other Ambulatory Visit: Payer: Self-pay | Admitting: Emergency Medicine

## 2012-12-13 DIAGNOSIS — C9110 Chronic lymphocytic leukemia of B-cell type not having achieved remission: Secondary | ICD-10-CM

## 2012-12-13 DIAGNOSIS — H9313 Tinnitus, bilateral: Secondary | ICD-10-CM

## 2012-12-13 DIAGNOSIS — I739 Peripheral vascular disease, unspecified: Secondary | ICD-10-CM

## 2012-12-14 ENCOUNTER — Encounter: Payer: Self-pay | Admitting: Oncology

## 2012-12-14 ENCOUNTER — Encounter: Payer: Self-pay | Admitting: *Deleted

## 2012-12-19 ENCOUNTER — Ambulatory Visit (HOSPITAL_COMMUNITY)
Admission: RE | Admit: 2012-12-19 | Discharge: 2012-12-19 | Disposition: A | Discharge status: Home / Self Care | Payer: BC Managed Care – PPO | Source: Ambulatory Visit | Attending: Surgery | Admitting: Surgery

## 2012-12-19 ENCOUNTER — Other Ambulatory Visit: Payer: Self-pay | Admitting: Oncology

## 2012-12-19 ENCOUNTER — Other Ambulatory Visit: Payer: Self-pay | Admitting: Surgery

## 2012-12-19 ENCOUNTER — Ambulatory Visit (HOSPITAL_COMMUNITY): Admission: RE | Admit: 2012-12-19 | Payer: BC Managed Care – PPO | Source: Ambulatory Visit

## 2012-12-19 ENCOUNTER — Ambulatory Visit (HOSPITAL_COMMUNITY): Payer: BC Managed Care – PPO

## 2012-12-19 ENCOUNTER — Other Ambulatory Visit: Payer: Self-pay | Admitting: Nurse Practitioner

## 2012-12-19 ENCOUNTER — Encounter: Payer: Self-pay | Admitting: Nurse Practitioner

## 2012-12-19 DIAGNOSIS — I739 Peripheral vascular disease, unspecified: Secondary | ICD-10-CM

## 2012-12-19 DIAGNOSIS — C9110 Chronic lymphocytic leukemia of B-cell type not having achieved remission: Secondary | ICD-10-CM

## 2012-12-19 DIAGNOSIS — R161 Splenomegaly, not elsewhere classified: Secondary | ICD-10-CM | POA: Insufficient documentation

## 2012-12-19 DIAGNOSIS — Z9221 Personal history of antineoplastic chemotherapy: Secondary | ICD-10-CM | POA: Insufficient documentation

## 2012-12-19 DIAGNOSIS — I2510 Atherosclerotic heart disease of native coronary artery without angina pectoris: Secondary | ICD-10-CM | POA: Insufficient documentation

## 2012-12-19 DIAGNOSIS — R599 Enlarged lymph nodes, unspecified: Secondary | ICD-10-CM | POA: Insufficient documentation

## 2012-12-19 DIAGNOSIS — I700 Atherosclerosis of aorta: Secondary | ICD-10-CM | POA: Insufficient documentation

## 2012-12-19 DIAGNOSIS — Z9089 Acquired absence of other organs: Secondary | ICD-10-CM | POA: Insufficient documentation

## 2012-12-19 DIAGNOSIS — J8410 Pulmonary fibrosis, unspecified: Secondary | ICD-10-CM | POA: Insufficient documentation

## 2012-12-19 MED ORDER — IOHEXOL 350 MG/ML SOLN
100.0000 mL | Freq: Once | INTRAVENOUS | Status: AC | PRN
  Administered 2012-12-19: 100 mL via INTRAVENOUS

## 2012-12-19 MED ORDER — SILDENAFIL CITRATE 50 MG PO TABS: 50.0000 mg | ORAL_TABLET | Freq: Every day | ORAL | Status: DC | PRN

## 2012-12-20 ENCOUNTER — Other Ambulatory Visit: Payer: Self-pay

## 2012-12-25 ENCOUNTER — Encounter (HOSPITAL_COMMUNITY): Payer: Self-pay

## 2012-12-25 ENCOUNTER — Ambulatory Visit (HOSPITAL_COMMUNITY)
Admission: RE | Admit: 2012-12-25 | Discharge: 2012-12-25 | Disposition: A | Discharge status: Home / Self Care | Payer: BC Managed Care – PPO | Source: Ambulatory Visit | Attending: Surgery | Admitting: Surgery

## 2012-12-25 DIAGNOSIS — J9819 Other pulmonary collapse: Secondary | ICD-10-CM | POA: Insufficient documentation

## 2012-12-25 DIAGNOSIS — H9319 Tinnitus, unspecified ear: Secondary | ICD-10-CM | POA: Insufficient documentation

## 2012-12-25 DIAGNOSIS — I658 Occlusion and stenosis of other precerebral arteries: Secondary | ICD-10-CM | POA: Insufficient documentation

## 2012-12-25 DIAGNOSIS — I6529 Occlusion and stenosis of unspecified carotid artery: Secondary | ICD-10-CM | POA: Insufficient documentation

## 2012-12-25 DIAGNOSIS — Z856 Personal history of leukemia: Secondary | ICD-10-CM | POA: Insufficient documentation

## 2012-12-25 MED ORDER — IOHEXOL 300 MG/ML  SOLN: 100.0000 mL | Freq: Once | INTRAMUSCULAR | Status: DC | PRN

## 2012-12-25 MED ORDER — IOHEXOL 350 MG/ML SOLN
100.0000 mL | Freq: Once | INTRAVENOUS | Status: AC | PRN
  Administered 2012-12-25: 100 mL via INTRAVENOUS

## 2012-12-26 ENCOUNTER — Ambulatory Visit (HOSPITAL_BASED_OUTPATIENT_CLINIC_OR_DEPARTMENT_OTHER): Payer: BC Managed Care – PPO | Admitting: Oncology

## 2012-12-26 ENCOUNTER — Ambulatory Visit (HOSPITAL_BASED_OUTPATIENT_CLINIC_OR_DEPARTMENT_OTHER): Payer: BC Managed Care – PPO

## 2012-12-26 ENCOUNTER — Other Ambulatory Visit (HOSPITAL_BASED_OUTPATIENT_CLINIC_OR_DEPARTMENT_OTHER): Payer: BC Managed Care – PPO | Admitting: Lab

## 2012-12-26 ENCOUNTER — Encounter: Payer: Self-pay | Admitting: Oncology

## 2012-12-26 VITALS — BP 121/63 | HR 59 | Temp 96.8°F | Resp 18

## 2012-12-26 VITALS — BP 115/59 | HR 59 | Temp 98.3°F | Resp 20

## 2012-12-26 DIAGNOSIS — D801 Nonfamilial hypogammaglobulinemia: Secondary | ICD-10-CM | POA: Insufficient documentation

## 2012-12-26 DIAGNOSIS — C9110 Chronic lymphocytic leukemia of B-cell type not having achieved remission: Secondary | ICD-10-CM

## 2012-12-26 DIAGNOSIS — D839 Common variable immunodeficiency, unspecified: Secondary | ICD-10-CM

## 2012-12-26 DIAGNOSIS — F17200 Nicotine dependence, unspecified, uncomplicated: Secondary | ICD-10-CM

## 2012-12-26 DIAGNOSIS — D696 Thrombocytopenia, unspecified: Secondary | ICD-10-CM

## 2012-12-26 LAB — COMPREHENSIVE METABOLIC PANEL (CC13)
Albumin: 3.8 g/dL (ref 3.5–5.0)
Anion Gap: 9 mEq/L (ref 3–11)
CO2: 21 mEq/L — ABNORMAL LOW (ref 22–29)
Calcium: 9.3 mg/dL (ref 8.4–10.4)
Chloride: 107 mEq/L (ref 98–109)
Glucose: 375 mg/dl — ABNORMAL HIGH (ref 70–140)
Potassium: 3.8 mEq/L (ref 3.5–5.1)
Sodium: 137 mEq/L (ref 136–145)
Total Protein: 6.4 g/dL (ref 6.4–8.3)

## 2012-12-26 LAB — CBC WITH DIFFERENTIAL/PLATELET
Eosinophils Absolute: 0 10*3/uL (ref 0.0–0.5)
HGB: 14.1 g/dL (ref 13.0–17.1)
LYMPH%: 2.5 % — ABNORMAL LOW (ref 14.0–49.0)
MONO#: 0.4 10*3/uL (ref 0.1–0.9)
NEUT#: 4.3 10*3/uL (ref 1.5–6.5)
Platelets: 49 10*3/uL — ABNORMAL LOW (ref 140–400)
RBC: 4.1 10*6/uL — ABNORMAL LOW (ref 4.20–5.82)
RDW: 14.8 % — ABNORMAL HIGH (ref 11.0–14.6)
WBC: 4.8 10*3/uL (ref 4.0–10.3)
lymph#: 0.1 10*3/uL — ABNORMAL LOW (ref 0.9–3.3)

## 2012-12-26 MED ORDER — HEPARIN SOD (PORK) LOCK FLUSH 100 UNIT/ML IV SOLN
500.0000 [IU] | Freq: Once | INTRAVENOUS | Status: AC
  Administered 2012-12-26: 500 [IU] via INTRAVENOUS
  Filled 2012-12-26: qty 5

## 2012-12-26 MED ORDER — IMMUNE GLOBULIN (HUMAN) 10 GM/100ML IV SOLN: 0.5000 g/kg | Freq: Once | INTRAVENOUS | Status: DC

## 2012-12-26 MED ORDER — IMMUNE GLOBULIN (HUMAN) 5 GM/100ML IV SOLN
400.0000 mg/kg | Freq: Once | INTRAVENOUS | Status: AC
  Administered 2012-12-26: 35 g via INTRAVENOUS
  Filled 2012-12-26: qty 100

## 2012-12-26 MED ORDER — SODIUM CHLORIDE 0.9 % IJ SOLN
10.0000 mL | INTRAMUSCULAR | Status: DC | PRN
  Administered 2012-12-26: 10 mL via INTRAVENOUS
  Filled 2012-12-26: qty 10

## 2012-12-26 MED ORDER — SODIUM CHLORIDE 0.9 % IV SOLN
Freq: Once | INTRAVENOUS | Status: AC
  Administered 2012-12-26: 10:00:00 via INTRAVENOUS

## 2012-12-26 NOTE — Progress Notes (Signed)
Pt observed 30 minutes post completion - discharged to home with no problems.

## 2012-12-26 NOTE — Progress Notes (Signed)
OFFICE PROGRESS NOTE  CC  Ryan Copeland [address] [place] [address]  DIAGNOSIS: 62 year old gentleman with stage III CLL with progressive disease.   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 Patient began IVIG q. monthly  CURRENT THERAPY:  IVIG q. monthly   INTERVAL HISTORY: Ryan Copeland 62 y.o. male returns for followup visit. Overall patient is doing well. He is here for his IVIG. He does have ongoing chronic sinus congestion with clear cough. He is every day smoker. We discussed off and on for quite some time smoking cessation. He is also fatigued. No nausea or vomiting no fevers chills or night sweats. Remainder of the 10 point review of systems is negative. MEDICAL HISTORY: Past Medical History  Diagnosis Date  . 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 Hyperlipidemia   . Tobacco abuse   . PVD (peripheral vascular disease)   . 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 Diabetes mellitus     ALLERGIES:  is allergic to codeine.  MEDICATIONS:  Current Outpatient Prescriptions  Medication Sig Dispense Refill  . acyclovir (ZOVIRAX) 400 MG tablet Take 1 tablet (400 mg total) by mouth daily.  30 tablet  5  . ALPRAZolam (XANAX) 0.25 MG tablet Take 1 po q 6 hours prn for anxiety  90 tablet  1  . aspirin 325 MG tablet Take 325 mg by mouth daily.        Marland Kitchen azithromycin (ZITHROMAX Z-PAK) 250 MG tablet Take 2 today then 1 a day until finished  6 each  0  . B Complex Vitamins (VITAMIN B COMPLEX IJ) Inject as directed every 30 (thirty) days.      . ciprofloxacin (CIPRO) 500 MG tablet Take 500 mg by mouth as needed.      . cyanocobalamin (,VITAMIN B-12,) 1000 MCG/ML injection Inject 1 mL (1,000 mcg total) into the muscle every 30 (thirty) days.  3 mL  1  .  diphenhydrAMINE (BENADRYL) 25 mg capsule Take 50 mg by mouth at bedtime as needed for itching or sleep.      Marland Kitchen esomeprazole (NEXIUM) 40 MG capsule Take 1 capsule (40 mg total) by mouth 2 (two) times daily.  180 capsule  3  . fluticasone (FLOVENT HFA) 110 MCG/ACT inhaler Inhale 1 puff into the lungs 2 (two) times daily.  1 Inhaler  12  . Ipratropium-Albuterol (COMBIVENT) 20-100 MCG/ACT AERS respimat Inhale 1 puff into the lungs every 6 (six) hours.  1 Inhaler  6  . lidocaine-prilocaine (EMLA) cream Apply topically as needed. Place cream on port site 1-2 hours prior to treatment.  30 g  2  . LORazepam (ATIVAN) 0.5 MG tablet Take 1 tablet (0.5 mg total) by mouth every 6 (six) hours as needed (Nausea or vomiting).  30 tablet  0  . metFORMIN (GLUCOPHAGE-XR) 500 MG 24 hr tablet Take 500 mg by mouth 2 (two) times daily.      . metoprolol succinate (TOPROL-XL) 50 MG 24 hr tablet Take 1 tablet (50 mg total) by mouth daily with breakfast. Take with or immediately following a meal.  90 tablet  3  . nitroGLYCERIN (NITROSTAT) 0.4 MG SL tablet Place 1 tablet (0.4 mg total) under the tongue every 5 (five) minutes as needed.  25 tablet  6  . oxyCODONE (OXY  IR/ROXICODONE) 5 MG immediate release tablet Take 1 tablet (5 mg total) by mouth every 4 (four) hours as needed for pain.  30 tablet  0  . prasugrel (EFFIENT) 10 MG TABS tablet Take 1 tablet (10 mg total) by mouth daily with breakfast.  90 tablet  3  . pregabalin (LYRICA) 75 MG capsule 1 cap three times daily and 2 caps at bedtime  150 capsule  6  . rosuvastatin (CRESTOR) 5 MG tablet Take 1 tablet (5 mg total) by mouth daily.  30 tablet  3  . sildenafil (VIAGRA) 50 MG tablet Take 1 tablet (50 mg total) by mouth daily as needed for erectile dysfunction.  10 tablet  0  . sulfamethoxazole-trimethoprim (BACTRIM DS,SEPTRA DS) 800-160 MG per tablet Take 1 tablet by mouth 3 (three) times a week.  12 tablet  5  . tetrahydrozoline 0.05 % ophthalmic solution Place 1 drop  into both eyes as needed. Red eyes      . topiramate (TOPAMAX) 25 MG capsule Take 1 capsule (25 mg total) by mouth 2 (two) times daily.  180 capsule  1  . varenicline (CHANTIX CONTINUING MONTH PAK) 1 MG tablet Take 1 tablet (1 mg total) by mouth 2 (two) times daily.  90 tablet  1   No current facility-administered medications for this visit.   Facility-Administered Medications Ordered in Other Visits  Medication Dose Route Frequency Provider Last Rate Last Dose  . 0.9 %  sodium chloride infusion   Intravenous Once Victorino December, MD      . heparin lock flush 100 unit/mL  500 Units Intravenous Once Victorino December, MD      . sodium chloride 0.9 % injection 10 mL  10 mL Intracatheter PRN Victorino December, MD   10 mL at 08/22/12 1721  . sodium chloride 0.9 % injection 10 mL  10 mL Intravenous PRN Victorino December, MD        SURGICAL HISTORY:  Past Surgical History  Procedure Laterality Date  . Femoral stents    . Coronary stent placement  May 2011  . Cholecystectomy  2007  . Carpel tunnel release Left [id number]  . Carpel tunnel release  Right 01-1989  . Tarsal tunnel release Bilateral 08-2007    REVIEW OF SYSTEMS:   General: fatigue (-), night sweats (-), fever (-), pain (-) Lymph: palpable nodes (-) HEENT: vision changes (-), mucositis (-), gum bleeding (-), epistaxis (-) Cardiovascular: chest pain (-), palpitations (-) Pulmonary: shortness of breath (-), dyspnea on exertion (-), cough (-), hemoptysis (-) GI:  Early satiety (-), melena (-), dysphagia (-), nausea/vomiting (-), diarrhea (-) GU: dysuria (-), hematuria (-), incontinence (-) Musculoskeletal: joint swelling (-), joint pain (-), back pain (-) Neuro: weakness (-), numbness (-), headache (-), confusion (-) Skin: Rash (-), lesions (-), dryness (-) Psych: depression (-), suicidal/homicidal ideation (-), feeling of hopelessness (-)   PHYSICAL EXAMINATION:  BP 121/63  Pulse 59  Temp(Src) 96.8 F (36 C) (Oral)  Resp 18 General:  Patient is a well appearing male in no acute distress HEENT: PERRLA, sclerae anicteric no conjunctival pallor, MMM Neck: supple, no palpable adenopathy Lungs: clear to auscultation bilaterally, no wheezes, rhonchi, or rales Cardiovascular: regular rate rhythm, S1, S2, no murmurs, rubs or gallops Abdomen: Soft, non-tender, non-distended, normoactive bowel sounds, liver is not palpable the splenic tip is palpable  Extremities: warm and well perfused, no clubbing, cyanosis, or edema Skin: No rashes or lesions Neuro: Non-focal ECOG PERFORMANCE STATUS: 0 - Asymptomatic  LABORATORY DATA: Lab Results  Component Value Date   WBC 4.8 12/26/2012   HGB 14.1 12/26/2012   HCT 41.8 12/26/2012   MCV 101.9* 12/26/2012   PLT 49* 12/26/2012      Chemistry      Component Value Date/Time   NA 137 12/26/2012 0946   NA 134* 11/02/2012 1206   K 3.8 12/26/2012 0946   K 4.1 11/02/2012 1206   CL 105 11/02/2012 1206   CL 104 08/03/2012 1229   CO2 21* 12/26/2012 0946   CO2 24 11/02/2012 1206   BUN 11.1 12/26/2012 0946   BUN 13 11/02/2012 1206   CREATININE 1.1 12/26/2012 0946   CREATININE 0.8 11/02/2012 1206      Component Value Date/Time   CALCIUM 9.3 12/26/2012 0946   CALCIUM 8.7 11/02/2012 1206   ALKPHOS 104 12/26/2012 0946   ALKPHOS 95 11/30/2012 0824   AST 22 12/26/2012 0946   AST 18 11/30/2012 0824   ALT 57* 12/26/2012 0946   ALT 44 11/30/2012 0824   BILITOT 0.53 12/26/2012 0946   BILITOT 0.5 11/30/2012 0824    FINAL DIAGNOSIS Diagnosis Bone Marrow, Aspirate,Biopsy, and Clot, right iliac bone - HYPERCELLULAR BONE MARROW WITH EXTENSIVE INVOLVEMENT BY CHRONIC LYMPHOCYTIC LEUKEMIA. PERIPHERAL BLOOD: - CHRONIC LYMPHOCYTIC LEUKEMIA. [name] MD Pathologist, Electronic Signature (Case signed 11/22/2011) GROSS AND MICROSCOPIC INFORMATION Specimen Clinical Information hx CLL [jl] Source Bone Marrow, Aspirate,Biopsy, and Clot, right iliac bone Microscopic LAB DATA: CBC performed on  11/18/11 shows: WBC 80.87 K/ul Neutrophils 5% HB 15.7 g/dl Lymphocytes 16% HCT 10.9 % Monocytes 1% MCV 96.0 fL Eosinophils 0% RDW 15.4 % Basophils 0% PLT 81 K/ul PERIPHERAL BLOOD SMEAR: The red blood cells display mild anisopoikilocytosis with minimal polychromasia. The white blood cells are markedly increased in number with lymphocytosis. The lymphocytes primarily consist of small lymphoid cells with generally high nuclear cytoplasmic ratio, dense chromatin with block type clumping, and inconspicuous nucleoli. There are numerous smudge cells in the 1 of 3 FINAL for AMARE, BAIL [phone]) Microscopic(continued) background. A significant prolymphocytic component is not present. The platelets are decreased in number. BONE MARROW ASPIRATE: Erythroid precursors: Relatively decreased with progressive maturation. Granulocytic precursors: Relatively decreased with progressive maturation. Megakaryocytes: Scattered forms are seen displaying normal morphology. Lymphocytes/plasma cells: The lymphocytes are markedly increased in number representing 95% of all cells and consist of small lymphoid cells with high nuclear cytoplasmic ratio, dense chromatin and inconspicuous nucleoli. A significant prolymphocytic or large lymphoid cell component is not present. Significant plasma cell aggregates are not present. TOUCH PREPARATIONS: Predominance of small lymphoid cells. CLOT and BIOPSY: The sections show 90% cellularity with extensive infiltration of the medullary space by numerous aggregates, interstitial infiltrates and diffuse sheets of primarily small lymphoid cells displaying high nuclear cytoplasmic ratio, dense chromatin and small to inconspicuous nucleoli. Scattered small proliferation centers are seen. Myeloid hematopoiesis appears decreased in the background. Immunohistochemical stains for CD20, CD79a, CD3, CD5, CD10 and cyclin D1 were performed with appropriate controls. The lymphoid  population is stains positively ffor CD79a (B cell marker) with patchy weaker positivity for CD20. There is patchy weak coexpression of CD5. No CD10 or cyclin D1 expression is identified. IRON STAIN: Iron stains are performed on a bone marrow aspirate smear and section of clot. The controls stained appropriately. Storage Iron: Decreased. Ringed Sideroblasts: Absent. ADDITIONAL DATA / TESTING: Specimen was sent for cytogenetic analysis and a separate report will follow. Flow cytometric analysis of the lymphoid population ([id number]) shows a major B cell population expressing  pan B cell antigens including CD20 and CD23 associated with coexpression of CD5. This population shows extremely dim/negative staining for surface immunoglobulin light chains. (BNS:gt, 11/21/11) Specimen Table Bone Marrow count performed on 500 cells shows: Blasts: 0% Myeloid 3% Promyelocyts: 0% Myelocytes: 0% Erythroid 2% Metamyelocyts: 1% Bands: 1% Lymphocytes: 95% Neutrophils: 1% Eosinophils: 0% Plasma Cells: 0% Basophils: 0% Monocytes: 0% M:E ratio: 1.5 Gross Received in [facility] are tissue fragments which aggregate 0.4 x 0.4 x 0.1 cm. The specimen is submitted in toto. Received in a separate container in [facility] are two cores of bone measuring 2.2 and 2.8 cm in length and each 0.2 cm in diameter. The specimen is submitted in toto following decalcification. (GP:eps 11/18/11) 2 of   RADIOGRAPHIC STUDIES: CT CHEST AND PELVIS WITH CONTRAST  TECHNIQUE:  Multidetector CT imaging of the abdomen was performed using the  standard protocol during bolus administration of intravenous  contrast. Multiplanar CT image reconstructions including MIPs were  obtained to evaluate the vascular anatomy. Multidetector CT imaging  of the chest and pelvis was performed using the standard protocol  during bolus administration of intravenous contrast.  CONTRAST: OMNIPAQUE IOHEXOL 350 MG/ML SOLN  COMPARISON: CT chest abdomen  pelvis dated 03/08/2012  FINDINGS:  CT CHEST FINDINGS  Lungs are essentially clear. No suspicious pulmonary nodules.  Calcified granuloma in the posterior right upper lobe (series 8/  image 49). No pleural effusion or pneumothorax.  Visualized thyroid is unremarkable.  The heart is top-normal in size. No pericardial effusion. Coronary  atherosclerosis with coronary stent. Atherosclerotic calcifications  of the aortic arch.  Right chest port.  Marked improvement in bilateral supraclavicular and axillary  lymphadenopathy, with residual tiny nodes measuring less than 5 mm.  Right subpectoral nodes measure up to 10 mm short axis (series 6/  image 21), previously 16 mm.  Mild degenerative changes of the thoracic spine.  CTA ABDOMEN and CT PELVIS FINDINGS  Atherosclerotic calcifications of the abdominal aorta and branch  vessels. Left common iliac stent. Celiac artery, SMA, and IMA remain  patent. The bilateral renal arteries are patent. Bilateral common  iliac arteries are patent. On delayed imaging, the bilateral  internal/external iliac arteries are patent.  Liver, pancreas, and adrenal glands are within normal limits.  Splenomegaly, measuring 18.7 cm in maximal craniocaudal dimension,  previously 25.2 cm.  Status post cholecystectomy. No intrahepatic or extrahepatic ductal  dilatation.  Kidneys are within normal limits. No hydronephrosis.  No evidence of bowel obstruction. Normal appendix.  No evidence of abdominopelvic ascites.  1.6 cm short axis left para-aortic node (series 6/ image 119),  previously 2.6 cm. Additional small retroperitoneal lymph nodes.  Prostate is unremarkable.  Bladder is within normal limits.  Mild degenerative changes of the lumbar spine.  Review of the MIP images confirms the above findings.  IMPRESSION:  Left common iliac stent. Abdominal vasculature remains patent.  Improving supraclavicular and axillary lymphadenopathy. Residual  right subpectoral  nodes measure up to 10 mm short axis.  Improving retroperitoneal lymphadenopathy, measuring up to 16 mm  short axis.  Improving splenomegaly, measuring 18.7 cm.     ASSESSMENT: 62 year old gentleman with  #1 CLL: Patient is now status post 6 cycles of FCR. He completed this on 06/20/2012 through 11/21/2012 for a total of 6 cycles. Overall he tolerated it well. He had no hospitalizations during history.  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 Patient is on IVIG monthly he will continue this for now.  #3 patient  had CT of the chest abdomen and pelvis performed we discussed the results today. He's had a good response to his chemotherapy. We will plan on doing scans in 3-6 months time in followup for restaging purposes  PLAN:  #1 patient will proceed with her scheduled IVIG  #2 restaging scan in about 3 months time  #3 thrombocytopenia: Continue to monitor her platelets are 49,000 today  #4 patient will be seen back in one month's time for next IVIG.  All questions were answered. The patient knows to call the clinic with any problems, questions or concerns. We can certainly see the patient much sooner if necessary.  I spent 25 minutes counseling the patient face to face. The total time spent in the appointment was 30 minutes.  Drue Second, MD Medical/Oncology [facility] [phone] (beeper) [phone] (Office)

## 2012-12-26 NOTE — Patient Instructions (Signed)
Immune Globulin Injection What is this medicine?   Privigen IMMUNE GLOBULIN (im MUNE GLOB yoo lin) helps to prevent or reduce the severity of certain infections in patients who are at risk. This medicine is collected from the pooled blood of many donors. It is used to treat immune system problems, thrombocytopenia, and Kawasaki syndrome. This medicine may be used for other purposes; ask your health care provider or pharmacist if you have questions. COMMON BRAND NAME(S): Baygam, BIVIGAM, Carimune NF, Carimune, Flebogamma DIF , Flebogamma, Gamimune N, Gammagard S/D, Gammaked, Gammaplex, Gammar-P IV, Gamunex, Hizentra, Iveegam EN, Iveegam, Panglobulin NF, Panglobulin, Polygam S/D, Privigen , Sandoglobulin, Venoglobulin-S, Vigam, Vivaglobulin What should I tell my health care provider before I take this medicine? They need to know if you have any of these conditions: - diabetes - extremely low or no immune antibodies in the blood - heart disease - history of blood clots - hyperprolinemia - infection in the blood, sepsis - kidney disease - taking medicine that may change kidney function - ask your health care provider about your medicine - an unusual or allergic reaction to human immune globulin, albumin, maltose, sucrose, polysorbate 80, other medicines, foods, dyes, or preservatives - pregnant or trying to get pregnant - breast-feeding How should I use this medicine? This medicine is for injection into a muscle or infusion into a vein or skin. It is usually given by a health care professional in a hospital or clinic setting. In rare cases, some brands of this medicine might be given at home. You will be taught how to give this medicine. Use exactly as directed. Take your medicine at regular intervals. Do not take your medicine more often than directed. Talk to your pediatrician regarding the use of this medicine in children. Special care may be needed. Overdosage: If you think you have  taken too much of this medicine contact a poison control center or emergency room at once. NOTE: This medicine is only for you. Do not share this medicine with others. What if I miss a dose? It is important not to miss your dose. Call your doctor or health care professional if you are unable to keep an appointment. If you give yourself the medicine and you miss a dose, take it as soon as you can. If it is almost time for your next dose, take only that dose. Do not take double or extra doses. What may interact with this medicine? -aspirin and aspirin-like medicines -cisplatin -cyclosporine -medicines for infection like acyclovir, adefovir, amphotericin B, bacitracin, cidofovir, foscarnet, ganciclovir, gentamicin, pentamidine, vancomycin -NSAIDS, medicines for pain and inflammation, like ibuprofen or naproxen -pamidronate -vaccines -zoledronic acid This list may not describe all possible interactions. Give your health care provider a list of all the medicines, herbs, non-prescription drugs, or dietary supplements you use. Also tell them if you smoke, drink alcohol, or use illegal drugs. Some items may interact with your medicine. What should I watch for while using this medicine? Your condition will be monitored carefully while you are receiving this medicine. This medicine is made from pooled blood donations of many different people. It may be possible to pass an infection in this medicine. However, the donors are screened for infections and all products are tested for HIV and hepatitis. The medicine is treated to kill most or all bacteria and viruses. Talk to your doctor about the risks and benefits of this medicine. Do not have vaccinations for at least 14 days before, or until at least 3 months after receiving  this medicine. What side effects may I notice from receiving this medicine? Side effects that you should report to your doctor or health care professional as soon as possible: -allergic  reactions like skin rash, itching or hives, swelling of the face, lips, or tongue -breathing problems -chest pain or tightness -fever, chills -headache with nausea, vomiting -neck pain or difficulty moving neck -pain when moving eyes -pain, swelling, warmth in the leg -problems with balance, talking, walking -sudden weight gain -swelling of the ankles, feet, hands -trouble passing urine or change in the amount of urine Side effects that usually do not require medical attention (report to your doctor or health care professional if they continue or are bothersome): -dizzy, drowsy -flushing -increased sweating -leg cramps -muscle aches and pains -pain at site where injected This list may not describe all possible side effects. Call your doctor for medical advice about side effects. You may report side effects to FDA at 1-800-FDA-1088. Where should I keep my medicine? Keep out of the reach of children. This drug is usually given in a hospital or clinic and will not be stored at home. In rare cases, some brands of this medicine may be given at home. If you are using this medicine at home, you will be instructed on how to store this medicine. Throw away any unused medicine after the expiration date on the label. NOTE: This sheet is a summary. It may not cover all possible information. If you have questions about this medicine, talk to your doctor, pharmacist, or health care provider.  2014, [employer]vier/Gold Standard. (2008-04-23 11:44:49)

## 2012-12-26 NOTE — Patient Instructions (Signed)
Proceed with IVIG  We will see you back in 1 month

## 2012-12-27 ENCOUNTER — Ambulatory Visit: Payer: Self-pay

## 2012-12-27 ENCOUNTER — Telehealth: Payer: Self-pay | Admitting: Neurology

## 2012-12-27 NOTE — Telephone Encounter (Signed)
Spoke with patient and he is requesting a sooner appt for his refill medication, will call back for refill meds

## 2012-12-28 ENCOUNTER — Encounter: Payer: Self-pay | Admitting: Surgery

## 2012-12-28 ENCOUNTER — Encounter: Payer: Self-pay | Admitting: Vascular Surgery

## 2012-12-31 ENCOUNTER — Telehealth: Payer: Self-pay | Admitting: Nurse Practitioner

## 2012-12-31 ENCOUNTER — Encounter: Payer: Self-pay | Admitting: Surgery

## 2012-12-31 ENCOUNTER — Ambulatory Visit (INDEPENDENT_AMBULATORY_CARE_PROVIDER_SITE_OTHER): Payer: BC Managed Care – PPO | Admitting: Surgery

## 2012-12-31 VITALS — BP 122/59 | HR 62 | Ht 72.0 in | Wt 204.0 lb

## 2012-12-31 DIAGNOSIS — I739 Peripheral vascular disease, unspecified: Secondary | ICD-10-CM

## 2012-12-31 DIAGNOSIS — H93A3 Pulsatile tinnitus, bilateral: Secondary | ICD-10-CM

## 2012-12-31 DIAGNOSIS — H9319 Tinnitus, unspecified ear: Secondary | ICD-10-CM

## 2012-12-31 DIAGNOSIS — I6529 Occlusion and stenosis of unspecified carotid artery: Secondary | ICD-10-CM

## 2012-12-31 NOTE — Progress Notes (Signed)
[facility]    Patient name: Ryan Copeland MRN: [id number] DOB: 02/18/1950 Sex: male     Chief Complaint  Patient presents with  . Re-evaluation    f/u after CTA - PVD    HISTORY OF PRESENT ILLNESS: The patient comes in today for followup. 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 He is a former patient of Dr. Madilyn Fireman. Dr. Madilyn Fireman did this last intervention and in June of 2010. He used a 10 x 40 Powerflex balloon. In February of last year and a increase in velocities within his stent were detected. I scheduled him for angiography to further evaluate this. We were trying to accommodate the patient's work schedule. Unfortunately, this procedure never was performed.  He came back in a month ago an ultrasound could not identify the previously elevated velocities.  In addition, the patient was complaining of pulsatile tinnitus for approximately 2 months.  I elected to get CT scans to further evaluate both the iliac stenosis as well as a pulsatile tinnitus.  He was scheduled on November 5 had CT scans for his CLL followup.  He is back today to discuss these results   Past Medical History  Diagnosis Date  . 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 Hyperlipidemia   . Tobacco abuse   . PVD (peripheral vascular disease)   . 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 Diabetes mellitus     Past Surgical History  Procedure Laterality Date  . Femoral stents    . Coronary stent placement  May 2011  . Cholecystectomy  2007  . Carpel tunnel release Left [id number]  . Carpel tunnel release  Right 01-1989  . Tarsal tunnel release Bilateral 08-2007    History   Social History  . Marital Status: Married    Spouse Name: N/A    Number of Children: N/A  . Years of Education: N/A   Occupational History  . Not on file.   Social History Main Topics  . Smoking status:  Current Every Day Smoker -- 1.00 packs/day for 43 years    Types: Cigarettes  . Smokeless tobacco: Never Used  . Alcohol Use: No     Comment:  drinks non-alcoholic beer  . Drug Use: No  . Sexual Activity: Yes   Other Topics Concern  . Not on file   Social History Narrative  . No narrative on file    Family History  Problem Relation Age of Onset  . Lung cancer Mother 3  . Cancer Mother     lung  . Heart failure Father 20  . Heart disease Father     Allergies as of 12/31/2012 - Review Complete 12/31/2012  Allergen Reaction Noted  . Codeine Hives 09/27/2008    Current Outpatient Prescriptions on File Prior to Visit  Medication Sig Dispense Refill  . acyclovir (ZOVIRAX) 400 MG tablet Take 1 tablet (400 mg total) by mouth daily.  30 tablet  5  . ALPRAZolam (XANAX) 0.25 MG tablet Take 1 po q 6 hours prn for anxiety  90 tablet  1  . aspirin 325 MG tablet Take 325 mg by mouth daily.        Copeland Kitchen azithromycin (ZITHROMAX Z-PAK) 250 MG tablet Take 2 today then 1 a day until finished  6 each  0  . B Complex Vitamins (VITAMIN B COMPLEX IJ) Inject as  directed every 30 (thirty) days.      . ciprofloxacin (CIPRO) 500 MG tablet Take 500 mg by mouth as needed.      . cyanocobalamin (,VITAMIN B-12,) 1000 MCG/ML injection Inject 1 mL (1,000 mcg total) into the muscle every 30 (thirty) days.  3 mL  1  . diphenhydrAMINE (BENADRYL) 25 mg capsule Take 50 mg by mouth at bedtime as needed for itching or sleep.      Copeland Kitchen esomeprazole (NEXIUM) 40 MG capsule Take 1 capsule (40 mg total) by mouth 2 (two) times daily.  180 capsule  3  . fluticasone (FLOVENT HFA) 110 MCG/ACT inhaler Inhale 1 puff into the lungs 2 (two) times daily.  1 Inhaler  12  . Ipratropium-Albuterol (COMBIVENT) 20-100 MCG/ACT AERS respimat Inhale 1 puff into the lungs every 6 (six) hours.  1 Inhaler  6  . lidocaine-prilocaine (EMLA) cream Apply topically as needed. Place cream on port site 1-2 hours prior to treatment.  30 g  2  .  metFORMIN (GLUCOPHAGE-XR) 500 MG 24 hr tablet Take 500 mg by mouth 2 (two) times daily.      . metoprolol succinate (TOPROL-XL) 50 MG 24 hr tablet Take 1 tablet (50 mg total) by mouth daily with breakfast. Take with or immediately following a meal.  90 tablet  3  . nitroGLYCERIN (NITROSTAT) 0.4 MG SL tablet Place 1 tablet (0.4 mg total) under the tongue every 5 (five) minutes as needed.  25 tablet  6  . oxyCODONE (OXY IR/ROXICODONE) 5 MG immediate release tablet Take 1 tablet (5 mg total) by mouth every 4 (four) hours as needed for pain.  30 tablet  0  . prasugrel (EFFIENT) 10 MG TABS tablet Take 1 tablet (10 mg total) by mouth daily with breakfast.  90 tablet  3  . pregabalin (LYRICA) 75 MG capsule 1 cap three times daily and 2 caps at bedtime  150 capsule  6  . rosuvastatin (CRESTOR) 5 MG tablet Take 1 tablet (5 mg total) by mouth daily.  30 tablet  3  . sildenafil (VIAGRA) 50 MG tablet Take 1 tablet (50 mg total) by mouth daily as needed for erectile dysfunction.  10 tablet  0  . sulfamethoxazole-trimethoprim (BACTRIM DS,SEPTRA DS) 800-160 MG per tablet Take 1 tablet by mouth 3 (three) times a week.  12 tablet  5  . tetrahydrozoline 0.05 % ophthalmic solution Place 1 drop into both eyes as needed. Red eyes      . topiramate (TOPAMAX) 25 MG capsule Take 1 capsule (25 mg total) by mouth 2 (two) times daily.  180 capsule  1  . varenicline (CHANTIX CONTINUING MONTH PAK) 1 MG tablet Take 1 tablet (1 mg total) by mouth 2 (two) times daily.  90 tablet  1   Current Facility-Administered Medications on File Prior to Visit  Medication Dose Route Frequency Provider Last Rate Last Dose  . sodium chloride 0.9 % injection 10 mL  10 mL Intracatheter PRN Victorino December, MD   10 mL at 08/22/12 1721     REVIEW OF SYSTEMS: No changes  PHYSICAL EXAMINATION:   Vital signs are BP 122/59  Pulse 62  Ht 6' (1.829 m)  Wt 204 lb (92.534 kg)  BMI 27.66 kg/m2  SpO2 100% General: The patient appears their stated  age. HEENT:  No gross abnormalities Pulmonary:  Non labored breathing  Diagnostic Studies I have reviewed all the patient's CT scan today.  There is no obvious etiology for the pulsatile  tinnitus.  In addition, the left iliac stent appears to be widely patent  Assessment: #1: Pulsatile tinnitus #2: Peripheral vascular disease Plan: #1: No obvious etiology of pulsatile tinnitus was seen on CT scan.  I therefore referred him to ENT for further evaluation.  He did have mild opacification of the ethmoid sinus.  #2: Peripheral vascular disease: The patient's iliac stent appears to be widely patent.  Hubie brought back for routine surveillance  of his lower extremities as well as his carotid disease which was detected on the CT scan  I spent greater than 30 minutes reviewing the patient's imaging studies and discussing them with the patient.  Jorge Ny, M.D. [facility] Office: [phone] Pager:  [phone]

## 2013-01-01 NOTE — Telephone Encounter (Signed)
I spoke to the pharmacist. She will refill for 1 month. Patient has not been seen since last November. Needs a followup appt.

## 2013-01-01 NOTE — Telephone Encounter (Signed)
Called to sched sooner appt, patient will call back for confirmation

## 2013-01-02 ENCOUNTER — Encounter: Payer: Self-pay | Admitting: Nurse Practitioner

## 2013-01-02 ENCOUNTER — Ambulatory Visit (INDEPENDENT_AMBULATORY_CARE_PROVIDER_SITE_OTHER): Payer: BC Managed Care – PPO | Admitting: Nurse Practitioner

## 2013-01-02 VITALS — BP 109/60 | HR 56 | Ht 72.0 in | Wt 212.0 lb

## 2013-01-02 DIAGNOSIS — G619 Inflammatory polyneuropathy, unspecified: Secondary | ICD-10-CM

## 2013-01-02 DIAGNOSIS — G609 Hereditary and idiopathic neuropathy, unspecified: Secondary | ICD-10-CM

## 2013-01-02 MED ORDER — TOPIRAMATE 25 MG PO CPSP: 25.0000 mg | ORAL_CAPSULE | Freq: Two times a day (BID) | ORAL | Status: DC

## 2013-01-02 MED ORDER — PREGABALIN 75 MG PO CAPS: ORAL_CAPSULE | ORAL | Status: DC

## 2013-01-02 NOTE — Patient Instructions (Signed)
Renew  Lyrica, 3 months with 1 refill Renew Topamax, 3 months with 3 refills F/U in 6 months

## 2013-01-02 NOTE — Progress Notes (Signed)
[facility]  PATIENT: Ryan Copeland DOB: 05-31-1950   REASON FOR VISIT: Followup for neuropathy    HISTORY OF PRESENT ILLNESS: Ryan Copeland, 62 year old white male returns for followup. He has a long-standing history of polyneuropathy. He is currently on Lyrica 75 mg 3 times daily and 2 tabs at night. He also has transdermal cream with good results. Unfortunately he has failed gabapentin and Cymbalta in the past. He is also on Topamax. He denies any falls since last seen. He continues to work full-time. He gets IVIG once a month for his CLL. He has had not had worsening of his symptoms   HISTORY: He has a history of polyneuropathy which has been long-standing. He was unable to get a renewal on his transdermal cream which has been helpful. He continues to have some pain, numbness and paresthesias in both feet left worse than right. Sensation is worse in great toes. His feet are cold he also has a burning sensation. Gabapentin was not beneficial for his symptoms. He was found to have a low B6 level and an extremely low vitamin B12 level at 121. He has been on vitamin B12 injections which his wife gives monthly. He is currently employed working in [place], [place]. He has had no falls, he is sleeping well. He is currently taking Lyrica 75 mg 3 times a day and two 50 mg at night. He was placed on Transdermal cream last year, with good results. He has failed Cymbalta. He is also on Topamax. He states his walking and balance are unchanged. He has not fallen. He continues to work full time. He has CLL and is just starting his chemotherapy.     REVIEW OF SYSTEMS: Full 14 system review of systems performed and notable only for:  Constitutional: N/A  Cardiovascular: N/A  Ear/Nose/Throat: N/A  Skin: N/A  Eyes: N/A  Respiratory: N/A  Gastroitestinal: N/A  Hematology/Lymphatic: N/A  Endocrine: N/A Musculoskeletal:N/A  Allergy/Immunology: N/A  Neurological: numbness Psychiatric:  N/A   ALLERGIES: Allergies  Allergen Reactions  . Codeine Hives    Pt states he can take a few, more reaction with extended doses.    HOME MEDICATIONS: Outpatient Prescriptions Prior to Visit  Medication Sig Dispense Refill  . acyclovir (ZOVIRAX) 400 MG tablet Take 1 tablet (400 mg total) by mouth daily.  30 tablet  5  . ALPRAZolam (XANAX) 0.25 MG tablet Take 1 po q 6 hours prn for anxiety  90 tablet  1  . aspirin 325 MG tablet Take 325 mg by mouth daily.        Marland Kitchen azithromycin (ZITHROMAX Z-PAK) 250 MG tablet Take 2 today then 1 a day until finished  6 each  0  . B Complex Vitamins (VITAMIN B COMPLEX IJ) Inject as directed every 30 (thirty) days.      . ciprofloxacin (CIPRO) 500 MG tablet Take 500 mg by mouth as needed.      . cyanocobalamin (,VITAMIN B-12,) 1000 MCG/ML injection Inject 1 mL (1,000 mcg total) into the muscle every 30 (thirty) days.  3 mL  1  . diphenhydrAMINE (BENADRYL) 25 mg capsule Take 50 mg by mouth at bedtime as needed for itching or sleep.      Marland Kitchen esomeprazole (NEXIUM) 40 MG capsule Take 1 capsule (40 mg total) by mouth 2 (two) times daily.  180 capsule  3  . fluticasone (FLOVENT HFA) 110 MCG/ACT inhaler Inhale 1 puff into the lungs 2 (two) times daily.  1 Inhaler  12  .  Ipratropium-Albuterol (COMBIVENT) 20-100 MCG/ACT AERS respimat Inhale 1 puff into the lungs every 6 (six) hours.  1 Inhaler  6  . lidocaine-prilocaine (EMLA) cream Apply topically as needed. Place cream on port site 1-2 hours prior to treatment.  30 g  2  . metFORMIN (GLUCOPHAGE-XR) 500 MG 24 hr tablet Take 500 mg by mouth 2 (two) times daily.      . metoprolol succinate (TOPROL-XL) 50 MG 24 hr tablet Take 1 tablet (50 mg total) by mouth daily with breakfast. Take with or immediately following a meal.  90 tablet  3  . nitroGLYCERIN (NITROSTAT) 0.4 MG SL tablet Place 1 tablet (0.4 mg total) under the tongue every 5 (five) minutes as needed.  25 tablet  6  . oxyCODONE (OXY IR/ROXICODONE) 5 MG immediate  release tablet Take 1 tablet (5 mg total) by mouth every 4 (four) hours as needed for pain.  30 tablet  0  . prasugrel (EFFIENT) 10 MG TABS tablet Take 1 tablet (10 mg total) by mouth daily with breakfast.  90 tablet  3  . pregabalin (LYRICA) 75 MG capsule 1 cap three times daily and 2 caps at bedtime  150 capsule  6  . rosuvastatin (CRESTOR) 5 MG tablet Take 1 tablet (5 mg total) by mouth daily.  30 tablet  3  . sildenafil (VIAGRA) 50 MG tablet Take 1 tablet (50 mg total) by mouth daily as needed for erectile dysfunction.  10 tablet  0  . sulfamethoxazole-trimethoprim (BACTRIM DS,SEPTRA DS) 800-160 MG per tablet Take 1 tablet by mouth 3 (three) times a week.  12 tablet  5  . tetrahydrozoline 0.05 % ophthalmic solution Place 1 drop into both eyes as needed. Red eyes      . topiramate (TOPAMAX) 25 MG capsule Take 1 capsule (25 mg total) by mouth 2 (two) times daily.  180 capsule  1  . varenicline (CHANTIX CONTINUING MONTH PAK) 1 MG tablet Take 1 tablet (1 mg total) by mouth 2 (two) times daily.  90 tablet  1   Facility-Administered Medications Prior to Visit  Medication Dose Route Frequency Provider Last Rate Last Dose  . sodium chloride 0.9 % injection 10 mL  10 mL Intracatheter PRN Victorino December, MD   10 mL at 08/22/12 1721    PAST MEDICAL HISTORY: Past Medical History  Diagnosis Date  . 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 Hyperlipidemia   . Tobacco abuse   . PVD (peripheral vascular disease)   . 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 Diabetes mellitus     PAST SURGICAL HISTORY: Past Surgical History  Procedure Laterality Date  . Femoral stents    . Coronary stent placement  May 2011  . Cholecystectomy  2007  . Carpel tunnel release Left [id number]  . Carpel tunnel release  Right 01-1989  . Tarsal tunnel release Bilateral 08-2007    FAMILY HISTORY: Family History  Problem Relation Age of Onset  . Lung cancer Mother 63  . Cancer  Mother     lung  . Heart failure Father 40  . Heart disease Father     SOCIAL HISTORY: History   Social History  . Marital Status: Married    Spouse Name: Harriett Sine    Number of Children: 1  . Years of Education: College   Occupational History  .      [employer]   Social History Main Topics  . Smoking  status: Current Every Day Smoker -- 1.00 packs/day for 43 years    Types: Cigarettes  . Smokeless tobacco: Never Used  . Alcohol Use: No     Comment:  drinks non-alcoholic beer  . Drug Use: No  . Sexual Activity: Yes   Other Topics Concern  . Not on file   Social History Narrative   Patient lives at home with wife.   Caffeine Use: 15 cups of caffeine weekly     PHYSICAL EXAM  Filed Vitals:   01/02/13 1419  BP: 109/60  Pulse: 56  Height: 6' (1.829 m)  Weight: 212 lb (96.163 kg)   Body mass index is 28.75 kg/(m^2).  Generalized: Well developed, in no acute distress  Neurological examination   Mentation: Alert oriented to time, place, history taking. Follows all commands speech and language fluent  Cranial nerve II-XII: Pupils were equal round reactive to light extraocular movements were full, visual field were full on confrontational test. Facial sensation and strength were normal. hearing was intact to finger rubbing bilaterally. Uvula tongue midline. head turning and shoulder shrug and were normal and symmetric.Tongue protrusion into cheek strength was normal. Motor: normal bulk and tone, full strength in the BUE, BLE, fine finger movements normal, no pronator drift. No focal weakness Sensory: Diminished  pinprick to the knees bilaterally, decreased vibratory to the toes position sense normal Coordination: finger-nose-finger, heel-to-shin bilaterally, no dysmetria Reflexes: Diminished and symmetric upper and lower  Gait and Station: Rising up from seated position without assistance, normal stance,  moderate stride, good arm swing, smooth turning, able to  perform tiptoe, and heel walking without difficulty.   DIAGNOSTIC DATA (LABS, IMAGING, TESTING) - I reviewed patient records, labs, notes, testing and imaging myself where available.  Lab Results  Component Value Date   WBC 4.8 12/26/2012   HGB 14.1 12/26/2012   HCT 41.8 12/26/2012   MCV 101.9* 12/26/2012   PLT 49* 12/26/2012      Component Value Date/Time   NA 137 12/26/2012 0946   NA 134* 11/02/2012 1206   K 3.8 12/26/2012 0946   K 4.1 11/02/2012 1206   CL 105 11/02/2012 1206   CL 104 08/03/2012 1229   CO2 21* 12/26/2012 0946   CO2 24 11/02/2012 1206   GLUCOSE 375* 12/26/2012 0946   GLUCOSE 261* 11/02/2012 1206   GLUCOSE 223* 08/03/2012 1229   BUN 11.1 12/26/2012 0946   BUN 13 11/02/2012 1206   CREATININE 1.1 12/26/2012 0946   CREATININE 0.8 11/02/2012 1206   CALCIUM 9.3 12/26/2012 0946   CALCIUM 8.7 11/02/2012 1206   PROT 6.4 12/26/2012 0946   PROT 6.4 11/30/2012 0824   ALBUMIN 3.8 12/26/2012 0946   ALBUMIN 3.7 11/30/2012 0824   AST 22 12/26/2012 0946   AST 18 11/30/2012 0824   ALT 57* 12/26/2012 0946   ALT 44 11/30/2012 0824   ALKPHOS 104 12/26/2012 0946   ALKPHOS 95 11/30/2012 0824   BILITOT 0.53 12/26/2012 0946   BILITOT 0.5 11/30/2012 0824   GFRNONAA 88* 06/01/2012 1145   GFRAA >90 06/01/2012 1145   Lab Results  Component Value Date   CHOL 93 11/30/2012   HDL 45.00 11/30/2012   LDLCALC 40 11/30/2012   LDLDIRECT 117.6 08/22/2011   TRIG 39.0 11/30/2012   CHOLHDL 2 11/30/2012    ASSESSMENT AND PLAN  62 y.o. year old male  has a past medical history of MI, acute, non ST segment elevation (06/28/2009); Hyperlipidemia; Tobacco abuse; PVD (peripheral vascular disease); CLL (chronic lymphocytic leukemia) (  03/18/2011); and Diabetes mellitus and standing history of polyneuropathy here for followup.  Renew  Lyrica, 3 months with 1 refill Renew Topamax, 3 months with 3 refills F/U in 6 months Nilda Riggs, [facility], [facility], APRN  [facility] [address], [address] [place], [place] [address] [phone]

## 2013-01-03 ENCOUNTER — Encounter: Payer: Self-pay | Admitting: Nurse Practitioner

## 2013-01-09 ENCOUNTER — Encounter: Payer: Self-pay | Admitting: Nurse Practitioner

## 2013-01-09 ENCOUNTER — Other Ambulatory Visit: Payer: Self-pay | Admitting: Oncology

## 2013-01-15 ENCOUNTER — Other Ambulatory Visit: Payer: Self-pay | Admitting: Emergency Medicine

## 2013-01-15 MED ORDER — CIPROFLOXACIN HCL 500 MG PO TABS: 500.0000 mg | ORAL_TABLET | Freq: Two times a day (BID) | ORAL | Status: DC

## 2013-01-17 ENCOUNTER — Other Ambulatory Visit: Payer: Self-pay | Admitting: Surgery

## 2013-01-17 DIAGNOSIS — C9110 Chronic lymphocytic leukemia of B-cell type not having achieved remission: Secondary | ICD-10-CM

## 2013-01-17 DIAGNOSIS — I739 Peripheral vascular disease, unspecified: Secondary | ICD-10-CM

## 2013-01-18 ENCOUNTER — Other Ambulatory Visit (HOSPITAL_COMMUNITY): Payer: Self-pay

## 2013-01-21 ENCOUNTER — Encounter: Payer: Self-pay | Admitting: Nurse Practitioner

## 2013-01-22 ENCOUNTER — Other Ambulatory Visit: Payer: Self-pay | Admitting: Nurse Practitioner

## 2013-01-22 ENCOUNTER — Other Ambulatory Visit: Payer: Self-pay | Admitting: *Deleted

## 2013-01-22 DIAGNOSIS — E785 Hyperlipidemia, unspecified: Secondary | ICD-10-CM

## 2013-01-22 DIAGNOSIS — I2510 Atherosclerotic heart disease of native coronary artery without angina pectoris: Secondary | ICD-10-CM

## 2013-01-22 MED ORDER — ROSUVASTATIN CALCIUM 5 MG PO TABS: 5.0000 mg | ORAL_TABLET | Freq: Every day | ORAL | Status: DC

## 2013-01-23 ENCOUNTER — Ambulatory Visit (HOSPITAL_COMMUNITY)
Admission: RE | Admit: 2013-01-23 | Discharge: 2013-01-23 | Disposition: A | Discharge status: Home / Self Care | Payer: BC Managed Care – PPO | Source: Ambulatory Visit | Attending: Oncology | Admitting: Oncology

## 2013-01-23 ENCOUNTER — Encounter: Payer: Self-pay | Admitting: Oncology

## 2013-01-23 ENCOUNTER — Other Ambulatory Visit (HOSPITAL_BASED_OUTPATIENT_CLINIC_OR_DEPARTMENT_OTHER): Payer: BC Managed Care – PPO

## 2013-01-23 ENCOUNTER — Ambulatory Visit (HOSPITAL_BASED_OUTPATIENT_CLINIC_OR_DEPARTMENT_OTHER): Payer: BC Managed Care – PPO | Admitting: Oncology

## 2013-01-23 ENCOUNTER — Telehealth: Payer: Self-pay | Admitting: *Deleted

## 2013-01-23 ENCOUNTER — Ambulatory Visit (HOSPITAL_BASED_OUTPATIENT_CLINIC_OR_DEPARTMENT_OTHER): Payer: BC Managed Care – PPO

## 2013-01-23 VITALS — BP 116/66 | HR 69 | Temp 98.7°F | Resp 18

## 2013-01-23 VITALS — BP 146/67 | HR 84 | Temp 97.7°F | Resp 18 | Ht 72.0 in | Wt 206.3 lb

## 2013-01-23 DIAGNOSIS — D696 Thrombocytopenia, unspecified: Secondary | ICD-10-CM

## 2013-01-23 DIAGNOSIS — D801 Nonfamilial hypogammaglobulinemia: Secondary | ICD-10-CM

## 2013-01-23 DIAGNOSIS — D839 Common variable immunodeficiency, unspecified: Secondary | ICD-10-CM

## 2013-01-23 DIAGNOSIS — C9110 Chronic lymphocytic leukemia of B-cell type not having achieved remission: Secondary | ICD-10-CM

## 2013-01-23 DIAGNOSIS — R05 Cough: Secondary | ICD-10-CM

## 2013-01-23 DIAGNOSIS — R062 Wheezing: Secondary | ICD-10-CM | POA: Insufficient documentation

## 2013-01-23 DIAGNOSIS — D709 Neutropenia, unspecified: Secondary | ICD-10-CM

## 2013-01-23 DIAGNOSIS — R059 Cough, unspecified: Secondary | ICD-10-CM | POA: Insufficient documentation

## 2013-01-23 LAB — CBC WITH DIFFERENTIAL/PLATELET
BASO%: 2.9 % — ABNORMAL HIGH (ref 0.0–2.0)
Basophils Absolute: 0 10*3/uL (ref 0.0–0.1)
Eosinophils Absolute: 0 10*3/uL (ref 0.0–0.5)
HGB: 14.4 g/dL (ref 13.0–17.1)
LYMPH%: 22.9 % (ref 14.0–49.0)
MCHC: 35.6 g/dL (ref 32.0–36.0)
MONO#: 0.1 10*3/uL (ref 0.1–0.9)
Platelets: 49 10*3/uL — ABNORMAL LOW (ref 140–400)
RBC: 4.21 10*6/uL (ref 4.20–5.82)
WBC: 0.4 10*3/uL — CL (ref 4.0–10.3)
nRBC: 0 % (ref 0–0)

## 2013-01-23 LAB — COMPREHENSIVE METABOLIC PANEL (CC13)
ALT: 31 U/L (ref 0–55)
AST: 15 U/L (ref 5–34)
Calcium: 9.5 mg/dL (ref 8.4–10.4)
Chloride: 108 mEq/L (ref 98–109)
Creatinine: 0.9 mg/dL (ref 0.7–1.3)
Glucose: 237 mg/dl — ABNORMAL HIGH (ref 70–140)
Total Bilirubin: 0.56 mg/dL (ref 0.20–1.20)

## 2013-01-23 MED ORDER — SULFAMETHOXAZOLE-TRIMETHOPRIM 800-160 MG PO TABS: 1.0000 | ORAL_TABLET | ORAL | Status: DC

## 2013-01-23 MED ORDER — SODIUM CHLORIDE 0.9 % IV SOLN
Freq: Once | INTRAVENOUS | Status: AC
  Administered 2013-01-23: 10:00:00 via INTRAVENOUS

## 2013-01-23 MED ORDER — DIPHENHYDRAMINE HCL 25 MG PO CAPS
ORAL_CAPSULE | ORAL | Status: AC
  Filled 2013-01-23: qty 1

## 2013-01-23 MED ORDER — IMMUNE GLOBULIN (HUMAN) 5 GM/100ML IV SOLN
35.0000 g | Freq: Once | INTRAVENOUS | Status: AC
  Administered 2013-01-23: 11:00:00 35 g via INTRAVENOUS
  Filled 2013-01-23: qty 100

## 2013-01-23 MED ORDER — IMMUNE GLOBULIN (HUMAN) 20 GM/200ML IV SOLN
35.0000 g | Freq: Once | INTRAVENOUS | Status: DC
  Filled 2013-01-23: qty 350

## 2013-01-23 MED ORDER — PEGFILGRASTIM INJECTION 6 MG/0.6ML
6.0000 mg | Freq: Once | SUBCUTANEOUS | Status: AC
  Administered 2013-01-23: 6 mg via SUBCUTANEOUS
  Filled 2013-01-23: qty 0.6

## 2013-01-23 MED ORDER — ACYCLOVIR 400 MG PO TABS: 400.0000 mg | ORAL_TABLET | Freq: Every day | ORAL | Status: DC

## 2013-01-23 MED ORDER — LIDOCAINE-PRILOCAINE 2.5-2.5 % EX CREA: TOPICAL_CREAM | CUTANEOUS | Status: DC | PRN

## 2013-01-23 MED ORDER — ALPRAZOLAM 0.25 MG PO TABS: ORAL_TABLET | ORAL | Status: DC

## 2013-01-23 MED ORDER — FLUTICASONE PROPIONATE HFA 110 MCG/ACT IN AERO: 1.0000 | INHALATION_SPRAY | Freq: Two times a day (BID) | RESPIRATORY_TRACT | Status: DC

## 2013-01-23 MED ORDER — SODIUM CHLORIDE 0.9 % IJ SOLN
10.0000 mL | INTRAMUSCULAR | Status: DC | PRN
  Administered 2013-01-23: 10 mL via INTRAVENOUS
  Filled 2013-01-23: qty 10

## 2013-01-23 MED ORDER — CIPROFLOXACIN HCL 500 MG PO TABS: 500.0000 mg | ORAL_TABLET | Freq: Two times a day (BID) | ORAL | Status: DC

## 2013-01-23 MED ORDER — DIPHENHYDRAMINE HCL 25 MG PO CAPS
25.0000 mg | ORAL_CAPSULE | Freq: Once | ORAL | Status: AC
  Administered 2013-01-23: 25 mg via ORAL

## 2013-01-23 MED ORDER — HEPARIN SOD (PORK) LOCK FLUSH 100 UNIT/ML IV SOLN
500.0000 [IU] | Freq: Once | INTRAVENOUS | Status: AC
  Administered 2013-01-23: 500 [IU] via INTRAVENOUS
  Filled 2013-01-23: qty 5

## 2013-01-23 NOTE — Progress Notes (Signed)
OFFICE PROGRESS NOTE  CC  Ryan Copeland [address] [place] [address]  DIAGNOSIS: 62 year old gentleman with stage III CLL with progressive disease.   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 Patient began IVIG q. monthly  CURRENT THERAPY:  IVIG q. monthly   INTERVAL HISTORY: Ryan Copeland 62 y.o. male returns for followup visit. Overall patient is doing well. He is here for his IVIG. He does have ongoing chronic sinus congestion with clear cough. He has also been having a rattling in wheezing. He is every day smoker. We discussed off and on for quite some time smoking cessation. He is also fatigued. No nausea or vomiting no fevers chills or night sweats. Remainder of the 10 point review of systems is negative. MEDICAL HISTORY: Past Medical History  Diagnosis Date  . 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 Hyperlipidemia   . Tobacco abuse   . PVD (peripheral vascular disease)   . 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 Diabetes mellitus     ALLERGIES:  is allergic to codeine.  MEDICATIONS:  Current Outpatient Prescriptions  Medication Sig Dispense Refill  . acyclovir (ZOVIRAX) 400 MG tablet Take 1 tablet (400 mg total) by mouth daily.  30 tablet  5  . ALPRAZolam (XANAX) 0.25 MG tablet Take 1 po q 6 hours prn for anxiety  90 tablet  1  . aspirin 325 MG tablet Take 325 mg by mouth daily.        . B Complex Vitamins (VITAMIN B COMPLEX IJ) Inject as directed every 30 (thirty) days.      . ciprofloxacin (CIPRO) 500 MG tablet Take 1 tablet (500 mg total) by mouth 2 (two) times daily.  14 tablet  6  . cyanocobalamin (,VITAMIN B-12,) 1000 MCG/ML injection Inject 1 mL (1,000 mcg total) into the muscle every 30 (thirty) days.  3 mL  1  . diphenhydrAMINE (BENADRYL)  25 mg capsule Take 50 mg by mouth at bedtime as needed for itching or sleep.      Marland Kitchen esomeprazole (NEXIUM) 40 MG capsule Take 1 capsule (40 mg total) by mouth 2 (two) times daily.  180 capsule  3  . fluticasone (FLOVENT HFA) 110 MCG/ACT inhaler Inhale 1 puff into the lungs 2 (two) times daily.  1 Inhaler  12  . Ipratropium-Albuterol (COMBIVENT) 20-100 MCG/ACT AERS respimat Inhale 1 puff into the lungs every 6 (six) hours.  1 Inhaler  6  . lidocaine-prilocaine (EMLA) cream Apply topically as needed. Place cream on port site 1-2 hours prior to treatment.  30 g  2  . metFORMIN (GLUCOPHAGE-XR) 500 MG 24 hr tablet Take 500 mg by mouth 2 (two) times daily.      . metoprolol succinate (TOPROL-XL) 50 MG 24 hr tablet Take 1 tablet (50 mg total) by mouth daily with breakfast. Take with or immediately following a meal.  90 tablet  3  . nitroGLYCERIN (NITROSTAT) 0.4 MG SL tablet Place 1 tablet (0.4 mg total) under the tongue every 5 (five) minutes as needed.  25 tablet  6  . oxyCODONE (OXY IR/ROXICODONE) 5 MG immediate release tablet Take 1 tablet (5 mg total) by mouth every 4 (four) hours as needed for pain.  30 tablet  0  . prasugrel (EFFIENT) 10 MG TABS tablet Take  1 tablet (10 mg total) by mouth daily with breakfast.  90 tablet  3  . pregabalin (LYRICA) 75 MG capsule 1 cap three times daily and 2 caps at bedtime  450 capsule  1  . rosuvastatin (CRESTOR) 5 MG tablet Take 1 tablet (5 mg total) by mouth daily.  90 tablet  3  . sildenafil (VIAGRA) 50 MG tablet Take 1 tablet (50 mg total) by mouth daily as needed for erectile dysfunction.  10 tablet  0  . sulfamethoxazole-trimethoprim (BACTRIM DS,SEPTRA DS) 800-160 MG per tablet Take 1 tablet by mouth 3 (three) times a week.  12 tablet  5  . tetrahydrozoline 0.05 % ophthalmic solution Place 1 drop into both eyes as needed. Red eyes      . varenicline (CHANTIX CONTINUING MONTH PAK) 1 MG tablet Take 1 tablet (1 mg total) by mouth 2 (two) times daily.  90 tablet  1   . topiramate (TOPAMAX) 25 MG capsule Take 1 capsule (25 mg total) by mouth 2 (two) times daily.  180 capsule  3  . topiramate (TOPAMAX) 25 MG tablet TAKE 1 TABLET BY MOUTH TWICE DAILY  180 tablet  3   No current facility-administered medications for this visit.   Facility-Administered Medications Ordered in Other Visits  Medication Dose Route Frequency Provider Last Rate Last Dose  . sodium chloride 0.9 % injection 10 mL  10 mL Intracatheter PRN Victorino December, MD   10 mL at 08/22/12 1721    SURGICAL HISTORY:  Past Surgical History  Procedure Laterality Date  . Femoral stents    . Coronary stent placement  May 2011  . Cholecystectomy  2007  . Carpel tunnel release Left [id number]  . Carpel tunnel release  Right 01-1989  . Tarsal tunnel release Bilateral 08-2007    REVIEW OF SYSTEMS:   General: fatigue (-), night sweats (-), fever (-), pain (-) Lymph: palpable nodes (-) HEENT: vision changes (-), mucositis (-), gum bleeding (-), epistaxis (-) Cardiovascular: chest pain (-), palpitations (-) Pulmonary: shortness of breath (-), dyspnea on exertion (-), cough (-), hemoptysis (-) GI:  Early satiety (-), melena (-), dysphagia (-), nausea/vomiting (-), diarrhea (-) GU: dysuria (-), hematuria (-), incontinence (-) Musculoskeletal: joint swelling (-), joint pain (-), back pain (-) Neuro: weakness (-), numbness (-), headache (-), confusion (-) Skin: Rash (-), lesions (-), dryness (-) Psych: depression (-), suicidal/homicidal ideation (-), feeling of hopelessness (-)   PHYSICAL EXAMINATION:  BP 146/67  Pulse 84  Temp(Src) 97.7 F (36.5 C) (Oral)  Resp 18  Ht 6' (1.829 m)  Wt 206 lb 4.8 oz (93.577 kg)  BMI 27.97 kg/m2 General: Patient is a well appearing male in no acute distress HEENT: PERRLA, sclerae anicteric no conjunctival pallor, MMM Neck: supple, no palpable adenopathy Lungs: clear to auscultation bilaterally, no wheezes, rhonchi, or rales Cardiovascular: regular rate  rhythm, S1, S2, no murmurs, rubs or gallops Abdomen: Soft, non-tender, non-distended, normoactive bowel sounds, liver is not palpable the splenic tip is palpable  Extremities: warm and well perfused, no clubbing, cyanosis, or edema Skin: No rashes or lesions Neuro: Non-focal ECOG PERFORMANCE STATUS: 0 - Asymptomatic  LABORATORY DATA: Lab Results  Component Value Date   WBC 4.8 12/26/2012   HGB 14.1 12/26/2012   HCT 41.8 12/26/2012   MCV 101.9* 12/26/2012   PLT 49* 12/26/2012      Chemistry      Component Value Date/Time   NA 137 12/26/2012 0946   NA 134* 11/02/2012 1206  K 3.8 12/26/2012 0946   K 4.1 11/02/2012 1206   CL 105 11/02/2012 1206   CL 104 08/03/2012 1229   CO2 21* 12/26/2012 0946   CO2 24 11/02/2012 1206   BUN 11.1 12/26/2012 0946   BUN 13 11/02/2012 1206   CREATININE 1.1 12/26/2012 0946   CREATININE 0.8 11/02/2012 1206      Component Value Date/Time   CALCIUM 9.3 12/26/2012 0946   CALCIUM 8.7 11/02/2012 1206   ALKPHOS 104 12/26/2012 0946   ALKPHOS 95 11/30/2012 0824   AST 22 12/26/2012 0946   AST 18 11/30/2012 0824   ALT 57* 12/26/2012 0946   ALT 44 11/30/2012 0824   BILITOT 0.53 12/26/2012 0946   BILITOT 0.5 11/30/2012 0824    FINAL DIAGNOSIS Diagnosis Bone Marrow, Aspirate,Biopsy, and Clot, right iliac bone - HYPERCELLULAR BONE MARROW WITH EXTENSIVE INVOLVEMENT BY CHRONIC LYMPHOCYTIC LEUKEMIA. PERIPHERAL BLOOD: - CHRONIC LYMPHOCYTIC LEUKEMIA. [name] MD Pathologist, Electronic Signature (Case signed 11/22/2011) GROSS AND MICROSCOPIC INFORMATION Specimen Clinical Information hx CLL [jl] Source Bone Marrow, Aspirate,Biopsy, and Clot, right iliac bone Microscopic LAB DATA: CBC performed on 11/18/11 shows: WBC 80.87 K/ul Neutrophils 5% HB 15.7 g/dl Lymphocytes 16% HCT 10.9 % Monocytes 1% MCV 96.0 fL Eosinophils 0% RDW 15.4 % Basophils 0% PLT 81 K/ul PERIPHERAL BLOOD SMEAR: The red blood cells display mild anisopoikilocytosis with  minimal polychromasia. The white blood cells are markedly increased in number with lymphocytosis. The lymphocytes primarily consist of small lymphoid cells with generally high nuclear cytoplasmic ratio, dense chromatin with block type clumping, and inconspicuous nucleoli. There are numerous smudge cells in the 1 of 3 FINAL for DICKEY, CAAMANO [phone]) Microscopic(continued) background. A significant prolymphocytic component is not present. The platelets are decreased in number. BONE MARROW ASPIRATE: Erythroid precursors: Relatively decreased with progressive maturation. Granulocytic precursors: Relatively decreased with progressive maturation. Megakaryocytes: Scattered forms are seen displaying normal morphology. Lymphocytes/plasma cells: The lymphocytes are markedly increased in number representing 95% of all cells and consist of small lymphoid cells with high nuclear cytoplasmic ratio, dense chromatin and inconspicuous nucleoli. A significant prolymphocytic or large lymphoid cell component is not present. Significant plasma cell aggregates are not present. TOUCH PREPARATIONS: Predominance of small lymphoid cells. CLOT and BIOPSY: The sections show 90% cellularity with extensive infiltration of the medullary space by numerous aggregates, interstitial infiltrates and diffuse sheets of primarily small lymphoid cells displaying high nuclear cytoplasmic ratio, dense chromatin and small to inconspicuous nucleoli. Scattered small proliferation centers are seen. Myeloid hematopoiesis appears decreased in the background. Immunohistochemical stains for CD20, CD79a, CD3, CD5, CD10 and cyclin D1 were performed with appropriate controls. The lymphoid population is stains positively ffor CD79a (B cell marker) with patchy weaker positivity for CD20. There is patchy weak coexpression of CD5. No CD10 or cyclin D1 expression is identified. IRON STAIN: Iron stains are performed on a bone marrow aspirate  smear and section of clot. The controls stained appropriately. Storage Iron: Decreased. Ringed Sideroblasts: Absent. ADDITIONAL DATA / TESTING: Specimen was sent for cytogenetic analysis and a separate report will follow. Flow cytometric analysis of the lymphoid population ([id number]) shows a major B cell population expressing pan B cell antigens including CD20 and CD23 associated with coexpression of CD5. This population shows extremely dim/negative staining for surface immunoglobulin light chains. (BNS:gt, 11/21/11) Specimen Table Bone Marrow count performed on 500 cells shows: Blasts: 0% Myeloid 3% Promyelocyts: 0% Myelocytes: 0% Erythroid 2% Metamyelocyts: 1% Bands: 1% Lymphocytes: 95% Neutrophils: 1% Eosinophils: 0% Plasma Cells: 0% Basophils: 0%  Monocytes: 0% M:E ratio: 1.5 Gross Received in [facility] are tissue fragments which aggregate 0.4 x 0.4 x 0.1 cm. The specimen is submitted in toto. Received in a separate container in [facility] are two cores of bone measuring 2.2 and 2.8 cm in length and each 0.2 cm in diameter. The specimen is submitted in toto following decalcification. (GP:eps 11/18/11) 2 of   RADIOGRAPHIC STUDIES: CT CHEST AND PELVIS WITH CONTRAST  TECHNIQUE:  Multidetector CT imaging of the abdomen was performed using the  standard protocol during bolus administration of intravenous  contrast. Multiplanar CT image reconstructions including MIPs were  obtained to evaluate the vascular anatomy. Multidetector CT imaging  of the chest and pelvis was performed using the standard protocol  during bolus administration of intravenous contrast.  CONTRAST: OMNIPAQUE IOHEXOL 350 MG/ML SOLN  COMPARISON: CT chest abdomen pelvis dated 03/08/2012  FINDINGS:  CT CHEST FINDINGS  Lungs are essentially clear. No suspicious pulmonary nodules.  Calcified granuloma in the posterior right upper lobe (series 8/  image 49). No pleural effusion or pneumothorax.  Visualized  thyroid is unremarkable.  The heart is top-normal in size. No pericardial effusion. Coronary  atherosclerosis with coronary stent. Atherosclerotic calcifications  of the aortic arch.  Right chest port.  Marked improvement in bilateral supraclavicular and axillary  lymphadenopathy, with residual tiny nodes measuring less than 5 mm.  Right subpectoral nodes measure up to 10 mm short axis (series 6/  image 21), previously 16 mm.  Mild degenerative changes of the thoracic spine.  CTA ABDOMEN and CT PELVIS FINDINGS  Atherosclerotic calcifications of the abdominal aorta and branch  vessels. Left common iliac stent. Celiac artery, SMA, and IMA remain  patent. The bilateral renal arteries are patent. Bilateral common  iliac arteries are patent. On delayed imaging, the bilateral  internal/external iliac arteries are patent.  Liver, pancreas, and adrenal glands are within normal limits.  Splenomegaly, measuring 18.7 cm in maximal craniocaudal dimension,  previously 25.2 cm.  Status post cholecystectomy. No intrahepatic or extrahepatic ductal  dilatation.  Kidneys are within normal limits. No hydronephrosis.  No evidence of bowel obstruction. Normal appendix.  No evidence of abdominopelvic ascites.  1.6 cm short axis left para-aortic node (series 6/ image 119),  previously 2.6 cm. Additional small retroperitoneal lymph nodes.  Prostate is unremarkable.  Bladder is within normal limits.  Mild degenerative changes of the lumbar spine.  Review of the MIP images confirms the above findings.  IMPRESSION:  Left common iliac stent. Abdominal vasculature remains patent.  Improving supraclavicular and axillary lymphadenopathy. Residual  right subpectoral nodes measure up to 10 mm short axis.  Improving retroperitoneal lymphadenopathy, measuring up to 16 mm  short axis.  Improving splenomegaly, measuring 18.7 cm.     ASSESSMENT: 62 year old gentleman with  #1 CLL: Patient is now status post 6  cycles of FCR. He completed this on 06/20/2012 through 11/21/2012 for a total of 6 cycles. Overall he tolerated it well. He had no hospitalizations during history.  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 Patient is on IVIG monthly he will continue this for now.  #3 patient had CT of the chest abdomen and pelvis performed we discussed the results today. He's had a good response to his chemotherapy. We will plan on doing scans in 3-6 months time in followup for restaging purposes  PLAN:  #1 patient will proceed with her scheduled IVIG #2 patient is neutropenic I will proceed with giving him Neulasta today.  #  3 he remains thrombocytopenic but no evidence of bleeding.  #4 patient with nonproductive cough with wheezing. We will go ahead and check a chest x-ray today. He is also given a prescription for Cipro.  #5 patient was given its multiple prescriptions refills today.  #6 patient will return on 02/21/2013 for next IVIG  All questions were answered. The patient knows to call the clinic with any problems, questions or concerns. We can certainly see the patient much sooner if necessary.  I spent 25 minutes counseling the patient face to face. The total time spent in the appointment was 30 minutes.  Drue Second, MD Medical/Oncology [facility] [phone] (beeper) [phone] (Office)

## 2013-01-23 NOTE — Telephone Encounter (Signed)
Per staff message and POF I have scheduled appts.  JMW  

## 2013-01-23 NOTE — Telephone Encounter (Signed)
Pt is aware to go have an cxr today. Pt is aware that i emailed KK to be more informed about his addiction so that i can assist him...td

## 2013-01-23 NOTE — Telephone Encounter (Signed)
appts made and printed. Pt is aware that tx will be added. i emailed MW to add the tx...td 

## 2013-01-23 NOTE — Patient Instructions (Signed)
Immune Globulin Injection - IVIG What is this medicine? IMMUNE GLOBULIN (im MUNE GLOB yoo lin) helps to prevent or reduce the severity of certain infections in patients who are at risk. This medicine is collected from the pooled blood of many donors. It is used to treat immune system problems, thrombocytopenia, and Kawasaki syndrome. This medicine may be used for other purposes; ask your health care provider or pharmacist if you have questions. COMMON BRAND NAME(S): Baygam, BIVIGAM, Carimune NF, Carimune, Flebogamma DIF , Flebogamma, Gamimune N, Gammagard S/D, Gammaked, Gammaplex, Gammar-P IV, Gamunex, Hizentra, Iveegam EN, Iveegam, Panglobulin NF, Panglobulin, Polygam S/D, Privigen , Sandoglobulin, Venoglobulin-S, Vigam, Vivaglobulin  What should I tell my health care provider before I take this medicine? They need to know if you have any of these conditions: -diabetes -extremely low or no immune antibodies in the blood -heart disease -history of blood clots -hyperprolinemia -infection in the blood, sepsis -kidney disease -taking medicine that may change kidney function - ask your health care provider about your medicine -an unusual or allergic reaction to human immune globulin, albumin, maltose, sucrose, polysorbate 80, other medicines, foods, dyes, or preservatives -pregnant or trying to get pregnant -breast-feeding  How should I use this medicine? This medicine is for injection into a muscle or infusion into a vein or skin. It is usually given by a health care professional in a hospital or clinic setting. In rare cases, some brands of this medicine might be given at home. You will be taught how to give this medicine. Use exactly as directed. Take your medicine at regular intervals. Do not take your medicine more often than directed. Talk to your pediatrician regarding the use of this medicine in children. Special care may be needed. Overdosage: If you think you have taken too much of this  medicine contact a poison control center or emergency room at once. NOTE: This medicine is only for you. Do not share this medicine with others.  What if I miss a dose? It is important not to miss your dose. Call your doctor or health care professional if you are unable to keep an appointment. If you give yourself the medicine and you miss a dose, take it as soon as you can. If it is almost time for your next dose, take only that dose. Do not take double or extra doses.  What may interact with this medicine? -aspirin and aspirin-like medicines -cisplatin -cyclosporine -medicines for infection like acyclovir, adefovir, amphotericin B, bacitracin, cidofovir, foscarnet, ganciclovir, gentamicin, pentamidine, vancomycin -NSAIDS, medicines for pain and inflammation, like ibuprofen or naproxen -pamidronate -vaccines -zoledronic acid This list may not describe all possible interactions. Give your health care provider a list of all the medicines, herbs, non-prescription drugs, or dietary supplements you use. Also tell them if you smoke, drink alcohol, or use illegal drugs. Some items may interact with your medicine.  What should I watch for while using this medicine? Your condition will be monitored carefully while you are receiving this medicine. This medicine is made from pooled blood donations of many different people. It may be possible to pass an infection in this medicine. However, the donors are screened for infections and all products are tested for HIV and hepatitis. The medicine is treated to kill most or all bacteria and viruses. Talk to your doctor about the risks and benefits of this medicine. Do not have vaccinations for at least 14 days before, or until at least 3 months after receiving this medicine.  What side effects may  I notice from receiving this medicine? Side effects that you should report to your doctor or health care professional as soon as possible: -allergic reactions like  skin rash, itching or hives, swelling of the face, lips, or tongue -breathing problems -chest pain or tightness -fever, chills -headache with nausea, vomiting -neck pain or difficulty moving neck -pain when moving eyes -pain, swelling, warmth in the leg -problems with balance, talking, walking -sudden weight gain -swelling of the ankles, feet, hands -trouble passing urine or change in the amount of urine Side effects that usually do not require medical attention (report to your doctor or health care professional if they continue or are bothersome): -dizzy, drowsy -flushing -increased sweating -leg cramps -muscle aches and pains -pain at site where injected This list may not describe all possible side effects. Call your doctor for medical advice about side effects. You may report side effects to FDA at 1-800-FDA-1088.  Where should I keep my medicine? Keep out of the reach of children. This drug is usually given in a hospital or clinic and will not be stored at home. In rare cases, some brands of this medicine may be given at home. If you are using this medicine at home, you will be instructed on how to store this medicine. Throw away any unused medicine after the expiration date on the label. NOTE: This sheet is a summary. It may not cover all possible information. If you have questions about this medicine, talk to your doctor, pharmacist, or health care provider.  2014, [employer]. (2008-04-23 11:44:49)

## 2013-01-24 ENCOUNTER — Ambulatory Visit: Payer: Self-pay

## 2013-01-24 NOTE — Progress Notes (Signed)
Quick Note:  Please call patient:lungs clear no pneumonia ______

## 2013-01-25 ENCOUNTER — Telehealth: Payer: Self-pay | Admitting: *Deleted

## 2013-01-25 ENCOUNTER — Other Ambulatory Visit (HOSPITAL_COMMUNITY): Payer: Self-pay

## 2013-01-25 NOTE — Telephone Encounter (Signed)
Message copied by Cooper Render on Fri Jan 25, 2013  4:40 PM ------      Message from: Victorino December      Created: Thu Jan 24, 2013  3:41 PM       Please call patient:lungs clear no pneumonia ------

## 2013-01-25 NOTE — Telephone Encounter (Signed)
Per MD, notified pt lungs clear, no pneumonia. Pt verbalized understanding. No further concerns.

## 2013-02-04 ENCOUNTER — Other Ambulatory Visit: Payer: Self-pay | Admitting: *Deleted

## 2013-02-04 DIAGNOSIS — C9110 Chronic lymphocytic leukemia of B-cell type not having achieved remission: Secondary | ICD-10-CM

## 2013-02-04 MED ORDER — IPRATROPIUM-ALBUTEROL 20-100 MCG/ACT IN AERS: 1.0000 | INHALATION_SPRAY | Freq: Four times a day (QID) | RESPIRATORY_TRACT | Status: DC

## 2013-02-04 NOTE — Telephone Encounter (Signed)
sw pt made him aware that i will mail the smoking information. Pt is aware...td

## 2013-02-11 ENCOUNTER — Ambulatory Visit: Payer: BC Managed Care – PPO | Admitting: Nurse Practitioner

## 2013-02-11 ENCOUNTER — Encounter: Payer: Self-pay | Admitting: Nurse Practitioner

## 2013-02-12 ENCOUNTER — Other Ambulatory Visit: Payer: Self-pay | Admitting: *Deleted

## 2013-02-12 DIAGNOSIS — C9110 Chronic lymphocytic leukemia of B-cell type not having achieved remission: Secondary | ICD-10-CM

## 2013-02-12 NOTE — Telephone Encounter (Signed)
Refill request for EMLA came from [facility]. Pt states he already had it filled elsewhere

## 2013-02-14 DIAGNOSIS — Z9289 Personal history of other medical treatment: Secondary | ICD-10-CM

## 2013-02-14 HISTORY — DX: Personal history of other medical treatment: Z92.89

## 2013-02-20 ENCOUNTER — Ambulatory Visit (HOSPITAL_BASED_OUTPATIENT_CLINIC_OR_DEPARTMENT_OTHER): Payer: BC Managed Care – PPO

## 2013-02-20 ENCOUNTER — Other Ambulatory Visit (HOSPITAL_BASED_OUTPATIENT_CLINIC_OR_DEPARTMENT_OTHER): Payer: BC Managed Care – PPO

## 2013-02-20 ENCOUNTER — Telehealth: Payer: Self-pay | Admitting: Oncology

## 2013-02-20 ENCOUNTER — Ambulatory Visit (HOSPITAL_BASED_OUTPATIENT_CLINIC_OR_DEPARTMENT_OTHER): Payer: BC Managed Care – PPO | Admitting: Oncology

## 2013-02-20 ENCOUNTER — Telehealth: Payer: Self-pay | Admitting: *Deleted

## 2013-02-20 VITALS — BP 128/69 | HR 62 | Temp 97.9°F | Resp 18

## 2013-02-20 VITALS — BP 133/64 | HR 58 | Temp 97.8°F | Resp 18 | Ht 72.0 in | Wt 207.2 lb

## 2013-02-20 DIAGNOSIS — D801 Nonfamilial hypogammaglobulinemia: Secondary | ICD-10-CM

## 2013-02-20 DIAGNOSIS — F17200 Nicotine dependence, unspecified, uncomplicated: Secondary | ICD-10-CM

## 2013-02-20 DIAGNOSIS — C9110 Chronic lymphocytic leukemia of B-cell type not having achieved remission: Secondary | ICD-10-CM

## 2013-02-20 DIAGNOSIS — D839 Common variable immunodeficiency, unspecified: Secondary | ICD-10-CM

## 2013-02-20 DIAGNOSIS — Z716 Tobacco abuse counseling: Secondary | ICD-10-CM

## 2013-02-20 DIAGNOSIS — D696 Thrombocytopenia, unspecified: Secondary | ICD-10-CM

## 2013-02-20 LAB — COMPREHENSIVE METABOLIC PANEL (CC13)
ALBUMIN: 3.9 g/dL (ref 3.5–5.0)
ALT: 53 U/L (ref 0–55)
AST: 19 U/L (ref 5–34)
Alkaline Phosphatase: 117 U/L (ref 40–150)
Anion Gap: 8 mEq/L (ref 3–11)
BILIRUBIN TOTAL: 0.51 mg/dL (ref 0.20–1.20)
BUN: 11.9 mg/dL (ref 7.0–26.0)
CO2: 26 mEq/L (ref 22–29)
CREATININE: 1.1 mg/dL (ref 0.7–1.3)
Calcium: 9.4 mg/dL (ref 8.4–10.4)
Chloride: 104 mEq/L (ref 98–109)
GLUCOSE: 440 mg/dL — AB (ref 70–140)
Potassium: 4.1 mEq/L (ref 3.5–5.1)
Sodium: 138 mEq/L (ref 136–145)
Total Protein: 6.4 g/dL (ref 6.4–8.3)

## 2013-02-20 LAB — CBC WITH DIFFERENTIAL/PLATELET
BASO%: 0.5 % (ref 0.0–2.0)
Basophils Absolute: 0 10*3/uL (ref 0.0–0.1)
EOS%: 0.2 % (ref 0.0–7.0)
Eosinophils Absolute: 0 10*3/uL (ref 0.0–0.5)
HCT: 42.9 % (ref 38.4–49.9)
HEMOGLOBIN: 15.2 g/dL (ref 13.0–17.1)
LYMPH%: 2.5 % — ABNORMAL LOW (ref 14.0–49.0)
MCH: 33.9 pg — AB (ref 27.2–33.4)
MCHC: 35.4 g/dL (ref 32.0–36.0)
MCV: 95.8 fL (ref 79.3–98.0)
MONO#: 0.4 10*3/uL (ref 0.1–0.9)
MONO%: 6.5 % (ref 0.0–14.0)
NEUT#: 5 10*3/uL (ref 1.5–6.5)
NEUT%: 90.3 % — ABNORMAL HIGH (ref 39.0–75.0)
Platelets: 67 10*3/uL — ABNORMAL LOW (ref 140–400)
RBC: 4.48 10*6/uL (ref 4.20–5.82)
RDW: 13.7 % (ref 11.0–14.6)
WBC: 5.5 10*3/uL (ref 4.0–10.3)
lymph#: 0.1 10*3/uL — ABNORMAL LOW (ref 0.9–3.3)

## 2013-02-20 LAB — LACTATE DEHYDROGENASE (CC13): LDH: 208 U/L (ref 125–245)

## 2013-02-20 MED ORDER — DIPHENHYDRAMINE HCL 25 MG PO CAPS
25.0000 mg | ORAL_CAPSULE | Freq: Once | ORAL | Status: AC
  Administered 2013-02-20: 25 mg via ORAL

## 2013-02-20 MED ORDER — ACETAMINOPHEN 325 MG PO TABS
650.0000 mg | ORAL_TABLET | Freq: Once | ORAL | Status: AC
  Administered 2013-02-20: 650 mg via ORAL

## 2013-02-20 MED ORDER — SODIUM CHLORIDE 0.9 % IV SOLN
Freq: Once | INTRAVENOUS | Status: AC
  Administered 2013-02-20: 09:00:00 via INTRAVENOUS

## 2013-02-20 MED ORDER — IMMUNE GLOBULIN (HUMAN) 20 GM/200ML IV SOLN
35.0000 g | Freq: Once | INTRAVENOUS | Status: AC
  Administered 2013-02-20: 35 g via INTRAVENOUS
  Filled 2013-02-20: qty 350

## 2013-02-20 MED ORDER — DIPHENHYDRAMINE HCL 25 MG PO CAPS
ORAL_CAPSULE | ORAL | Status: AC
  Filled 2013-02-20: qty 1

## 2013-02-20 MED ORDER — ALTEPLASE 2 MG IJ SOLR
2.0000 mg | Freq: Once | INTRAMUSCULAR | Status: DC | PRN
  Filled 2013-02-20: qty 2

## 2013-02-20 MED ORDER — ACETAMINOPHEN 325 MG PO TABS
ORAL_TABLET | ORAL | Status: AC
  Filled 2013-02-20: qty 2

## 2013-02-20 NOTE — Telephone Encounter (Signed)
Per staff message and POF I have scheduled appts.  JMW  

## 2013-02-20 NOTE — Progress Notes (Signed)
OFFICE PROGRESS NOTE  CC  Lev[address] [place] [address]  DIAGNOSIS: 63 year old gentleman with stage III CLL with progressive disease.   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 Patient began IVIG q. monthly  CURRENT THERAPY:  IVIG q. monthly   INTERVAL HISTORY: Ryan Copeland 63 y.o. male returns for followup visit. Overall patient is doing well. He is here for his IVIG. Patient's cough has improved. However He is every day smoker but he does want to quit smoking. On his prior visit and I have made a referral for smoking cessation. Unfortunately that appointment was now stopped and he is going to try to speak to the scheduler again to get the right appointments.. We discussed off and on for quite some time smoking cessation. He is also fatigued. No nausea or vomiting no fevers chills or night sweats. He has not had any recurrent infections. He has not had any bleeding problems. His bowels are normal. He continues to work full-time. Remainder of the 10 point review of systems is negative.  MEDICAL HISTORY: Past Medical History  Diagnosis Date  . 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 Hyperlipidemia   . Tobacco abuse   . PVD (peripheral vascular disease)   . 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 Diabetes mellitus     ALLERGIES:  is allergic to codeine.  MEDICATIONS:  Current Outpatient Prescriptions  Medication Sig Dispense Refill  . acyclovir (ZOVIRAX) 400 MG tablet Take 1 tablet (400 mg total) by mouth daily.  90 tablet  5  . ALPRAZolam (XANAX) 0.25 MG tablet Take 1 po q 6 hours prn for anxiety  90 tablet  1  . aspirin 325 MG tablet Take 325 mg by mouth daily.        . B Complex Vitamins (VITAMIN B COMPLEX IJ) Inject as directed every 30  (thirty) days.      . ciprofloxacin (CIPRO) 500 MG tablet Take 1 tablet (500 mg total) by mouth 2 (two) times daily.  28 tablet  6  . cyanocobalamin (,VITAMIN B-12,) 1000 MCG/ML injection Inject 1 mL (1,000 mcg total) into the muscle every 30 (thirty) days.  3 mL  1  . diphenhydrAMINE (BENADRYL) 25 mg capsule Take 50 mg by mouth at bedtime as needed for itching or sleep.      Marland Kitchen esomeprazole (NEXIUM) 40 MG capsule Take 1 capsule (40 mg total) by mouth 2 (two) times daily.  180 capsule  3  . fluticasone (FLOVENT HFA) 110 MCG/ACT inhaler Inhale 1 puff into the lungs 2 (two) times daily.  2 Inhaler  12  . Ipratropium-Albuterol (COMBIVENT) 20-100 MCG/ACT AERS respimat Inhale 1 puff into the lungs every 6 (six) hours.  1 Inhaler  1  . lidocaine-prilocaine (EMLA) cream Apply topically as needed. Place cream on port site 1-2 hours prior to treatment.  30 g  6  . metFORMIN (GLUCOPHAGE-XR) 500 MG 24 hr tablet Take 500 mg by mouth 2 (two) times daily.      . metoprolol succinate (TOPROL-XL) 50 MG 24 hr tablet Take 1 tablet (50 mg total) by mouth daily with breakfast. Take with or immediately following a meal.  90 tablet  3  . nitroGLYCERIN (NITROSTAT) 0.4 MG SL tablet Place 1 tablet (0.4 mg total)  under the tongue every 5 (five) minutes as needed.  25 tablet  6  . oxyCODONE (OXY IR/ROXICODONE) 5 MG immediate release tablet Take 1 tablet (5 mg total) by mouth every 4 (four) hours as needed for pain.  30 tablet  0  . prasugrel (EFFIENT) 10 MG TABS tablet Take 1 tablet (10 mg total) by mouth daily with breakfast.  90 tablet  3  . pregabalin (LYRICA) 75 MG capsule 1 cap three times daily and 2 caps at bedtime  450 capsule  1  . rosuvastatin (CRESTOR) 5 MG tablet Take 1 tablet (5 mg total) by mouth daily.  90 tablet  3  . sildenafil (VIAGRA) 50 MG tablet Take 1 tablet (50 mg total) by mouth daily as needed for erectile dysfunction.  10 tablet  0  . sulfamethoxazole-trimethoprim (BACTRIM DS,SEPTRA DS) 800-160 MG per  tablet Take 1 tablet by mouth 3 (three) times a week.  90 tablet  5  . tetrahydrozoline 0.05 % ophthalmic solution Place 1 drop into both eyes as needed. Red eyes      . topiramate (TOPAMAX) 25 MG capsule Take 1 capsule (25 mg total) by mouth 2 (two) times daily.  180 capsule  3  . topiramate (TOPAMAX) 25 MG tablet TAKE 1 TABLET BY MOUTH TWICE DAILY  180 tablet  3  . varenicline (CHANTIX CONTINUING MONTH PAK) 1 MG tablet Take 1 tablet (1 mg total) by mouth 2 (two) times daily.  90 tablet  1   No current facility-administered medications for this visit.   Facility-Administered Medications Ordered in Other Visits  Medication Dose Route Frequency Provider Last Rate Last Dose  . alteplase (CATHFLO ACTIVASE) injection 2 mg  2 mg Intracatheter Once PRN Deatra Robinson, MD      . sodium chloride 0.9 % injection 10 mL  10 mL Intracatheter PRN Deatra Robinson, MD   10 mL at 08/22/12 1721    SURGICAL HISTORY:  Past Surgical History  Procedure Laterality Date  . Femoral stents    . Coronary stent placement  May 2011  . Cholecystectomy  2007  . Carpel tunnel release Left [id number]  . Carpel tunnel release  Right 01-1989  . Tarsal tunnel release Bilateral 08-2007    REVIEW OF SYSTEMS:   General: fatigue (-), night sweats (-), fever (-), pain (-) Lymph: palpable nodes (-) HEENT: vision changes (-), mucositis (-), gum bleeding (-), epistaxis (-) Cardiovascular: chest pain (-), palpitations (-) Pulmonary: shortness of breath (-), dyspnea on exertion (-), cough (-), hemoptysis (-) GI:  Early satiety (-), melena (-), dysphagia (-), nausea/vomiting (-), diarrhea (-) GU: dysuria (-), hematuria (-), incontinence (-) Musculoskeletal: joint swelling (-), joint pain (-), back pain (-) Neuro: weakness (-), numbness (-), headache (-), confusion (-) Skin: Rash (-), lesions (-), dryness (-) Psych: depression (-), suicidal/homicidal ideation (-), feeling of hopelessness (-)   PHYSICAL EXAMINATION:  BP 133/64   Pulse 58  Temp(Src) 97.8 F (36.6 C) (Oral)  Resp 18  Ht 6' (1.829 m)  Wt 207 lb 3.2 oz (93.985 kg)  BMI 28.10 kg/m2 General: Patient is a well appearing male in no acute distress HEENT: PERRLA, sclerae anicteric no conjunctival pallor, MMM Neck: supple, no palpable adenopathy Lungs: clear to auscultation bilaterally, no wheezes, rhonchi, or rales Cardiovascular: regular rate rhythm, S1, S2, no murmurs, rubs or gallops Abdomen: Soft, non-tender, non-distended, normoactive bowel sounds, liver is not palpable the splenic tip is palpable  Extremities: warm and well perfused, no clubbing, cyanosis, or  edema Skin: No rashes or lesions Neuro: Non-focal ECOG PERFORMANCE STATUS: 0 - Asymptomatic  LABORATORY DATA: Lab Results  Component Value Date   WBC 5.5 02/20/2013   HGB 15.2 02/20/2013   HCT 42.9 02/20/2013   MCV 95.8 02/20/2013   PLT 67* 02/20/2013      Chemistry      Component Value Date/Time   NA 138 02/20/2013 0818   NA 134* 11/02/2012 1206   K 4.1 02/20/2013 0818   K 4.1 11/02/2012 1206   CL 105 11/02/2012 1206   CL 104 08/03/2012 1229   CO2 26 02/20/2013 0818   CO2 24 11/02/2012 1206   BUN 11.9 02/20/2013 0818   BUN 13 11/02/2012 1206   CREATININE 1.1 02/20/2013 0818   CREATININE 0.8 11/02/2012 1206      Component Value Date/Time   CALCIUM 9.4 02/20/2013 0818   CALCIUM 8.7 11/02/2012 1206   ALKPHOS 117 02/20/2013 0818   ALKPHOS 95 11/30/2012 0824   AST 19 02/20/2013 0818   AST 18 11/30/2012 0824   ALT 53 02/20/2013 0818   ALT 44 11/30/2012 0824   BILITOT 0.51 02/20/2013 0818   BILITOT 0.5 11/30/2012 0824    FINAL DIAGNOSIS Diagnosis Bone Marrow, Aspirate,Biopsy, and Clot, right iliac bone - HYPERCELLULAR BONE MARROW WITH EXTENSIVE INVOLVEMENT BY CHRONIC LYMPHOCYTIC LEUKEMIA. PERIPHERAL BLOOD: - CHRONIC LYMPHOCYTIC LEUKEMIA. Susanne Greenhouse MD Pathologist, Electronic Signature (Case signed 11/22/2011) GROSS AND MICROSCOPIC INFORMATION Specimen Clinical Information hx CLL  [jl] Source Bone Marrow, Aspirate,Biopsy, and Clot, right iliac bone Microscopic LAB DATA: CBC performed on 11/18/11 shows: WBC 80.87 K/ul Neutrophils 5% HB 15.7 g/dl Lymphocytes 94% HCT 46.1 % Monocytes 1% MCV 96.0 fL Eosinophils 0% RDW 15.4 % Basophils 0% PLT 81 K/ul PERIPHERAL BLOOD SMEAR: The red blood cells display mild anisopoikilocytosis with minimal polychromasia. The white blood cells are markedly increased in number with lymphocytosis. The lymphocytes primarily consist of small lymphoid cells with generally high nuclear cytoplasmic ratio, dense chromatin with block type clumping, and inconspicuous nucleoli. There are numerous smudge cells in the 1 of 3 FINAL for Ryan Copeland, Ryan Copeland [phone]) Microscopic(continued) background. A significant prolymphocytic component is not present. The platelets are decreased in number. BONE MARROW ASPIRATE: Erythroid precursors: Relatively decreased with progressive maturation. Granulocytic precursors: Relatively decreased with progressive maturation. Megakaryocytes: Scattered forms are seen displaying normal morphology. Lymphocytes/plasma cells: The lymphocytes are markedly increased in number representing 95% of all cells and consist of small lymphoid cells with high nuclear cytoplasmic ratio, dense chromatin and inconspicuous nucleoli. A significant prolymphocytic or large lymphoid cell component is not present. Significant plasma cell aggregates are not present. TOUCH PREPARATIONS: Predominance of small lymphoid cells. CLOT and BIOPSY: The sections show 90% cellularity with extensive infiltration of the medullary space by numerous aggregates, interstitial infiltrates and diffuse sheets of primarily small lymphoid cells displaying high nuclear cytoplasmic ratio, dense chromatin and small to inconspicuous nucleoli. Scattered small proliferation centers are seen. Myeloid hematopoiesis appears decreased in the background.  Immunohistochemical stains for CD20, CD79a, CD3, CD5, CD10 and cyclin D1 were performed with appropriate controls. The lymphoid population is stains positively ffor CD79a (B cell marker) with patchy weaker positivity for CD20. There is patchy weak coexpression of CD5. No CD10 or cyclin D1 expression is identified. IRON STAIN: Iron stains are performed on a bone marrow aspirate smear and section of clot. The controls stained appropriately. Storage Iron: Decreased. Ringed Sideroblasts: Absent. ADDITIONAL DATA / TESTING: Specimen was sent for cytogenetic analysis and a separate report will follow.  Flow cytometric analysis of the lymphoid population ([id number]) shows a major B cell population expressing pan B cell antigens including CD20 and CD23 associated with coexpression of CD5. This population shows extremely dim/negative staining for surface immunoglobulin light chains. (BNS:gt, 11/21/11) Specimen Table Bone Marrow count performed on 500 cells shows: Blasts: 0% Myeloid 3% Promyelocyts: 0% Myelocytes: 0% Erythroid 2% Metamyelocyts: 1% Bands: 1% Lymphocytes: 95% Neutrophils: 1% Eosinophils: 0% Plasma Cells: 0% Basophils: 0% Monocytes: 0% M:E ratio: 1.5 Gross Received in [facility] are tissue fragments which aggregate 0.4 x 0.4 x 0.1 cm. The specimen is submitted in toto. Received in a separate container in [facility] are two cores of bone measuring 2.2 and 2.8 cm in length and each 0.2 cm in diameter. The specimen is submitted in toto following decalcification. (GP:eps 11/18/11) 2 of   RADIOGRAPHIC STUDIES: CT CHEST AND PELVIS WITH CONTRAST  TECHNIQUE:  Multidetector CT imaging of the abdomen was performed using the  standard protocol during bolus administration of intravenous  contrast. Multiplanar CT image reconstructions including MIPs were  obtained to evaluate the vascular anatomy. Multidetector CT imaging  of the chest and pelvis was performed using the standard protocol  during  bolus administration of intravenous contrast.  CONTRAST: 149mL OMNIPAQUE IOHEXOL 350 MG/ML SOLN  COMPARISON: CT chest abdomen pelvis dated 03/08/2012  FINDINGS:  CT CHEST FINDINGS  Lungs are essentially clear. No suspicious pulmonary nodules.  Calcified granuloma in the posterior right upper lobe (series 8/  image 49). No pleural effusion or pneumothorax.  Visualized thyroid is unremarkable.  The heart is top-normal in size. No pericardial effusion. Coronary  atherosclerosis with coronary stent. Atherosclerotic calcifications  of the aortic arch.  Right chest port.  Marked improvement in bilateral supraclavicular and axillary  lymphadenopathy, with residual tiny nodes measuring less than 5 mm.  Right subpectoral nodes measure up to 10 mm short axis (series 6/  image 21), previously 16 mm.  Mild degenerative changes of the thoracic spine.  CTA ABDOMEN and CT PELVIS FINDINGS  Atherosclerotic calcifications of the abdominal aorta and branch  vessels. Left common iliac stent. Celiac artery, SMA, and IMA remain  patent. The bilateral renal arteries are patent. Bilateral common  iliac arteries are patent. On delayed imaging, the bilateral  internal/external iliac arteries are patent.  Liver, pancreas, and adrenal glands are within normal limits.  Splenomegaly, measuring 18.7 cm in maximal craniocaudal dimension,  previously 25.2 cm.  Status post cholecystectomy. No intrahepatic or extrahepatic ductal  dilatation.  Kidneys are within normal limits. No hydronephrosis.  No evidence of bowel obstruction. Normal appendix.  No evidence of abdominopelvic ascites.  1.6 cm short axis left para-aortic node (series 6/ image 119),  previously 2.6 cm. Additional small retroperitoneal lymph nodes.  Prostate is unremarkable.  Bladder is within normal limits.  Mild degenerative changes of the lumbar spine.  Review of the MIP images confirms the above findings.  IMPRESSION:  Left common iliac stent.  Abdominal vasculature remains patent.  Improving supraclavicular and axillary lymphadenopathy. Residual  right subpectoral nodes measure up to 10 mm short axis.  Improving retroperitoneal lymphadenopathy, measuring up to 16 mm  short axis.  Improving splenomegaly, measuring 18.7 cm.     ASSESSMENT: 63 year old gentleman with  #1 CLL: Patient is now status post 6 cycles of FCR. He completed this on 06/20/2012 through 11/21/2012 for a total of 6 cycles. Overall he tolerated it well. He had no hospitalizations during history.  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 Patient is on IVIG monthly he will continue this for now.  #3 patient had CT of the chest abdomen and pelvis performed we discussed the results today. He's had a good response to his chemotherapy. We will plan on doing scans in 3-6 months time in followup for restaging purposes  #4 neutropenia: Resolved after receiving Neulasta injection on his last visit 34 weeks ago.  #5 chronic sinus congestion: Improved. I do think IVIG is benefiting him and we will continue IVIG monthly.  #6 smoking cessation:  will pursue this and get his schedule from the schedulers.  #7 Thrombocytopenia: Improved in comparison to prior visit. He certainly can pursue his dental procedure if his dentist feels comfortable.  PLAN:   #1 patient will proceed with her scheduled IVIG  #2 thrombocytopenia slightly improved but he still remains at risk for bleeding and we discussed this.  #3 smoking cessation referral to be pursued.  #4 patient will be seen back in 4 weeks' time for next IVIG  All questions were answered. The patient knows to call the clinic with any problems, questions or concerns. We can certainly see the patient much sooner if necessary.  I spent 25 minutes counseling the patient face to face. The total time spent in the appointment was 30 minutes.  Marcy Panning,  MD Medical/Oncology [facility] [phone] (beeper) [phone] (Office)

## 2013-02-20 NOTE — Patient Instructions (Signed)

## 2013-02-20 NOTE — Telephone Encounter (Signed)
, °

## 2013-02-21 LAB — IGG, IGA, IGM
IGA: 72 mg/dL (ref 68–379)
IGG (IMMUNOGLOBIN G), SERUM: 689 mg/dL (ref 650–1600)
IGM, SERUM: 22 mg/dL — AB (ref 41–251)

## 2013-03-18 ENCOUNTER — Ambulatory Visit: Payer: BC Managed Care – PPO | Admitting: Nurse Practitioner

## 2013-03-27 ENCOUNTER — Other Ambulatory Visit (HOSPITAL_BASED_OUTPATIENT_CLINIC_OR_DEPARTMENT_OTHER): Payer: BC Managed Care – PPO

## 2013-03-27 ENCOUNTER — Ambulatory Visit (HOSPITAL_BASED_OUTPATIENT_CLINIC_OR_DEPARTMENT_OTHER): Payer: BC Managed Care – PPO | Admitting: Oncology

## 2013-03-27 ENCOUNTER — Ambulatory Visit (HOSPITAL_BASED_OUTPATIENT_CLINIC_OR_DEPARTMENT_OTHER): Payer: BC Managed Care – PPO

## 2013-03-27 ENCOUNTER — Encounter: Payer: Self-pay | Admitting: Oncology

## 2013-03-27 VITALS — BP 109/65 | HR 59 | Temp 97.8°F | Resp 16

## 2013-03-27 VITALS — BP 148/91 | HR 74 | Temp 97.8°F | Resp 20 | Ht 72.0 in | Wt 211.7 lb

## 2013-03-27 DIAGNOSIS — D801 Nonfamilial hypogammaglobulinemia: Secondary | ICD-10-CM

## 2013-03-27 DIAGNOSIS — J329 Chronic sinusitis, unspecified: Secondary | ICD-10-CM

## 2013-03-27 DIAGNOSIS — C9110 Chronic lymphocytic leukemia of B-cell type not having achieved remission: Secondary | ICD-10-CM

## 2013-03-27 DIAGNOSIS — D839 Common variable immunodeficiency, unspecified: Secondary | ICD-10-CM

## 2013-03-27 LAB — CBC WITH DIFFERENTIAL/PLATELET
BASO%: 0.5 % (ref 0.0–2.0)
Basophils Absolute: 0 10*3/uL (ref 0.0–0.1)
EOS%: 1.3 % (ref 0.0–7.0)
Eosinophils Absolute: 0.1 10*3/uL (ref 0.0–0.5)
HEMATOCRIT: 43.5 % (ref 38.4–49.9)
HGB: 15.1 g/dL (ref 13.0–17.1)
LYMPH%: 5.1 % — AB (ref 14.0–49.0)
MCH: 33.3 pg (ref 27.2–33.4)
MCHC: 34.7 g/dL (ref 32.0–36.0)
MCV: 95.8 fL (ref 79.3–98.0)
MONO#: 0.3 10*3/uL (ref 0.1–0.9)
MONO%: 6.8 % (ref 0.0–14.0)
NEUT#: 3.4 10*3/uL (ref 1.5–6.5)
NEUT%: 86.3 % — AB (ref 39.0–75.0)
PLATELETS: 67 10*3/uL — AB (ref 140–400)
RBC: 4.54 10*6/uL (ref 4.20–5.82)
RDW: 13.7 % (ref 11.0–14.6)
WBC: 4 10*3/uL (ref 4.0–10.3)
lymph#: 0.2 10*3/uL — ABNORMAL LOW (ref 0.9–3.3)
nRBC: 0 % (ref 0–0)

## 2013-03-27 LAB — COMPREHENSIVE METABOLIC PANEL (CC13)
ALT: 35 U/L (ref 0–55)
AST: 16 U/L (ref 5–34)
Albumin: 3.9 g/dL (ref 3.5–5.0)
Alkaline Phosphatase: 110 U/L (ref 40–150)
Anion Gap: 9 mEq/L (ref 3–11)
BUN: 13.7 mg/dL (ref 7.0–26.0)
CHLORIDE: 110 meq/L — AB (ref 98–109)
CO2: 21 mEq/L — ABNORMAL LOW (ref 22–29)
CREATININE: 1.1 mg/dL (ref 0.7–1.3)
Calcium: 9.3 mg/dL (ref 8.4–10.4)
Glucose: 351 mg/dl — ABNORMAL HIGH (ref 70–140)
Potassium: 4 mEq/L (ref 3.5–5.1)
Sodium: 140 mEq/L (ref 136–145)
Total Bilirubin: 0.42 mg/dL (ref 0.20–1.20)
Total Protein: 6.2 g/dL — ABNORMAL LOW (ref 6.4–8.3)

## 2013-03-27 MED ORDER — IMMUNE GLOBULIN (HUMAN) 20 GM/200ML IV SOLN
35.0000 g | Freq: Once | INTRAVENOUS | Status: AC
  Administered 2013-03-27: 35 g via INTRAVENOUS
  Filled 2013-03-27: qty 350

## 2013-03-27 MED ORDER — HEPARIN SOD (PORK) LOCK FLUSH 100 UNIT/ML IV SOLN
500.0000 [IU] | Freq: Once | INTRAVENOUS | Status: AC
  Administered 2013-03-27: 500 [IU] via INTRAVENOUS
  Filled 2013-03-27: qty 5

## 2013-03-27 MED ORDER — DIPHENHYDRAMINE HCL 25 MG PO CAPS
25.0000 mg | ORAL_CAPSULE | Freq: Once | ORAL | Status: AC
  Administered 2013-03-27: 25 mg via ORAL

## 2013-03-27 MED ORDER — SODIUM CHLORIDE 0.9 % IJ SOLN
10.0000 mL | INTRAMUSCULAR | Status: DC | PRN
  Administered 2013-03-27: 10 mL via INTRAVENOUS
  Filled 2013-03-27: qty 10

## 2013-03-27 MED ORDER — ACETAMINOPHEN 325 MG PO TABS
ORAL_TABLET | ORAL | Status: AC
  Filled 2013-03-27: qty 2

## 2013-03-27 MED ORDER — ACETAMINOPHEN 325 MG PO TABS
650.0000 mg | ORAL_TABLET | Freq: Once | ORAL | Status: AC
  Administered 2013-03-27: 650 mg via ORAL

## 2013-03-27 MED ORDER — DIPHENHYDRAMINE HCL 25 MG PO CAPS
ORAL_CAPSULE | ORAL | Status: AC
  Filled 2013-03-27: qty 1

## 2013-03-27 NOTE — Progress Notes (Signed)
1015-Patient requested tylenol and benadryl with infusion, Dr. Humphrey Rolls notified and agreed both can be administered. Orders put in.  1020- VS stable, privigen infusion started at 48 ml/hr x 12 ml 1035- patient stable, no questions at this time, rate increased to 96 ml/hr x 24 ml 1050- Patient stable, no questions or concerns, rate increased to 192 ml/hr x 48 ml  1105- Patient and VS stable, no questions or concerns, rate increased to 384 ml/hr x 96 ml 1158- Infusion completed, 30 min post infusion monitoring started 1228- 30 mins observation completed, patient stable, no questions or concerns.

## 2013-03-27 NOTE — Patient Instructions (Addendum)
Immune Globulin Injection (Privigen) What is this medicine? IMMUNE GLOBULIN (im MUNE GLOB yoo lin) helps to prevent or reduce the severity of certain infections in patients who are at risk. This medicine is collected from the pooled blood of many donors. It is used to treat immune system problems, thrombocytopenia, and Kawasaki syndrome. This medicine may be used for other purposes; ask your health care provider or pharmacist if you have questions. COMMON BRAND NAME(S): Baygam, BIVIGAM, Carimune NF, Carimune, Flebogamma DIF , Flebogamma, Gamimune N, Gammagard S/D, Gammaked, Gammaplex, Gammar-P IV, Gamunex, Hizentra, Iveegam EN, Iveegam, Panglobulin NF, Panglobulin, Polygam S/D, Privigen , Sandoglobulin, Venoglobulin-S, Vigam, Vivaglobulin What should I tell my health care provider before I take this medicine? They need to know if you have any of these conditions: - diabetes - extremely low or no immune antibodies in the blood - heart disease - history of blood clots - hyperprolinemia - infection in the blood, sepsis - kidney disease - taking medicine that may change kidney function - ask your health care provider about your medicine - an unusual or allergic reaction to human immune globulin, albumin, maltose, sucrose, polysorbate 80, other medicines, foods, dyes, or preservatives - pregnant or trying to get pregnant - breast-feeding How should I use this medicine? This medicine is for injection into a muscle or infusion into a vein or skin. It is usually given by a health care professional in a hospital or clinic setting. In rare cases, some brands of this medicine might be given at home. You will be taught how to give this medicine. Use exactly as directed. Take your medicine at regular intervals. Do not take your medicine more often than directed. Talk to your pediatrician regarding the use of this medicine in children. Special care may be needed. Overdosage: If you think you have  taken too much of this medicine contact a poison control center or emergency room at once. NOTE: This medicine is only for you. Do not share this medicine with others. What if I miss a dose? It is important not to miss your dose. Call your doctor or health care professional if you are unable to keep an appointment. If you give yourself the medicine and you miss a dose, take it as soon as you can. If it is almost time for your next dose, take only that dose. Do not take double or extra doses. What may interact with this medicine? -aspirin and aspirin-like medicines -cisplatin -cyclosporine -medicines for infection like acyclovir, adefovir, amphotericin B, bacitracin, cidofovir, foscarnet, ganciclovir, gentamicin, pentamidine, vancomycin -NSAIDS, medicines for pain and inflammation, like ibuprofen or naproxen -pamidronate -vaccines -zoledronic acid This list may not describe all possible interactions. Give your health care provider a list of all the medicines, herbs, non-prescription drugs, or dietary supplements you use. Also tell them if you smoke, drink alcohol, or use illegal drugs. Some items may interact with your medicine. What should I watch for while using this medicine? Your condition will be monitored carefully while you are receiving this medicine. This medicine is made from pooled blood donations of many different people. It may be possible to pass an infection in this medicine. However, the donors are screened for infections and all products are tested for HIV and hepatitis. The medicine is treated to kill most or all bacteria and viruses. Talk to your doctor about the risks and benefits of this medicine. Do not have vaccinations for at least 14 days before, or until at least 3 months after receiving this medicine.  What side effects may I notice from receiving this medicine? Side effects that you should report to your doctor or health care professional as soon as possible: -allergic  reactions like skin rash, itching or hives, swelling of the face, lips, or tongue -breathing problems -chest pain or tightness -fever, chills -headache with nausea, vomiting -neck pain or difficulty moving neck -pain when moving eyes -pain, swelling, warmth in the leg -problems with balance, talking, walking -sudden weight gain -swelling of the ankles, feet, hands -trouble passing urine or change in the amount of urine Side effects that usually do not require medical attention (report to your doctor or health care professional if they continue or are bothersome): -dizzy, drowsy -flushing -increased sweating -leg cramps -muscle aches and pains -pain at site where injected This list may not describe all possible side effects. Call your doctor for medical advice about side effects. You may report side effects to FDA at 1-800-FDA-1088. Where should I keep my medicine? Keep out of the reach of children. This drug is usually given in a hospital or clinic and will not be stored at home. In rare cases, some brands of this medicine may be given at home. If you are using this medicine at home, you will be instructed on how to store this medicine. Throw away any unused medicine after the expiration date on the label. NOTE: This sheet is a summary. It may not cover all possible information. If you have questions about this medicine, talk to your doctor, pharmacist, or health care provider.  2014, [employer]/Gold Standard. (2008-04-23 11:44:49)

## 2013-03-27 NOTE — Progress Notes (Signed)
OFFICE PROGRESS NOTE  CC  Lev[address] [place] [address]  DIAGNOSIS: 63 year old gentleman with stage III CLL with progressive disease.   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 Patient began IVIG q. monthly  CURRENT THERAPY:  IVIG q. monthly   INTERVAL HISTORY: Ryan Copeland 63 y.o. male returns for followup visit. Overall patient is doing well. He is here for his IVIG. Patient's cough has improved. However He is every day smoker but he does want to quit smoking. On his prior visit and I have made a referral for smoking cessation. Unfortunately that appointment was now stopped and he is going to try to speak to the scheduler again to get the right appointments.. We discussed off and on for quite some time smoking cessation. He is ready to quit and will be joining smoking cessation classes in March. He is also fatigued. No nausea or vomiting no fevers chills or night sweats. He has not had any recurrent infections. He has not had any bleeding problems. His bowels are normal. He continues to work full-time. Remainder of the 10 point review of systems is negative.  MEDICAL HISTORY: Past Medical History  Diagnosis Date  . 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 Hyperlipidemia   . Tobacco abuse   . PVD (peripheral vascular disease)   . 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 Diabetes mellitus     ALLERGIES:  is allergic to codeine.  MEDICATIONS:  Current Outpatient Prescriptions  Medication Sig Dispense Refill  . acyclovir (ZOVIRAX) 400 MG tablet Take 1 tablet (400 mg total) by mouth daily.  90 tablet  5  . ALPRAZolam (XANAX) 0.25 MG tablet Take 1 po q 6 hours prn for anxiety  90 tablet  1  . aspirin 325 MG tablet Take 325 mg by mouth daily.         . B Complex Vitamins (VITAMIN B COMPLEX IJ) Inject as directed every 30 (thirty) days.      . ciprofloxacin (CIPRO) 500 MG tablet Take 1 tablet (500 mg total) by mouth 2 (two) times daily.  28 tablet  6  . cyanocobalamin (,VITAMIN B-12,) 1000 MCG/ML injection Inject 1 mL (1,000 mcg total) into the muscle every 30 (thirty) days.  3 mL  1  . diphenhydrAMINE (BENADRYL) 25 mg capsule Take 50 mg by mouth at bedtime as needed for itching or sleep.      Marland Kitchen esomeprazole (NEXIUM) 40 MG capsule Take 1 capsule (40 mg total) by mouth 2 (two) times daily.  180 capsule  3  . fluticasone (FLOVENT HFA) 110 MCG/ACT inhaler Inhale 1 puff into the lungs 2 (two) times daily.  2 Inhaler  12  . Ipratropium-Albuterol (COMBIVENT) 20-100 MCG/ACT AERS respimat Inhale 1 puff into the lungs every 6 (six) hours.  1 Inhaler  1  . lidocaine-prilocaine (EMLA) cream Apply topically as needed. Place cream on port site 1-2 hours prior to treatment.  30 g  6  . metFORMIN (GLUCOPHAGE-XR) 500 MG 24 hr tablet Take 500 mg by mouth 2 (two) times daily.      . metoprolol succinate (TOPROL-XL) 50 MG 24 hr tablet Take 1 tablet (50 mg total) by mouth daily with breakfast. Take with or immediately following a meal.  90 tablet  3  .  nitroGLYCERIN (NITROSTAT) 0.4 MG SL tablet Place 1 tablet (0.4 mg total) under the tongue every 5 (five) minutes as needed.  25 tablet  6  . oxyCODONE (OXY IR/ROXICODONE) 5 MG immediate release tablet Take 1 tablet (5 mg total) by mouth every 4 (four) hours as needed for pain.  30 tablet  0  . prasugrel (EFFIENT) 10 MG TABS tablet Take 1 tablet (10 mg total) by mouth daily with breakfast.  90 tablet  3  . pregabalin (LYRICA) 75 MG capsule 1 cap three times daily and 2 caps at bedtime  450 capsule  1  . rosuvastatin (CRESTOR) 5 MG tablet Take 1 tablet (5 mg total) by mouth daily.  90 tablet  3  . sildenafil (VIAGRA) 50 MG tablet Take 1 tablet (50 mg total) by mouth daily as needed for erectile dysfunction.  10 tablet   0  . sulfamethoxazole-trimethoprim (BACTRIM DS,SEPTRA DS) 800-160 MG per tablet Take 1 tablet by mouth 3 (three) times a week.  90 tablet  5  . tetrahydrozoline 0.05 % ophthalmic solution Place 1 drop into both eyes as needed. Red eyes      . topiramate (TOPAMAX) 25 MG capsule Take 1 capsule (25 mg total) by mouth 2 (two) times daily.  180 capsule  3  . topiramate (TOPAMAX) 25 MG tablet TAKE 1 TABLET BY MOUTH TWICE DAILY  180 tablet  3  . varenicline (CHANTIX CONTINUING MONTH PAK) 1 MG tablet Take 1 tablet (1 mg total) by mouth 2 (two) times daily.  90 tablet  1   No current facility-administered medications for this visit.   Facility-Administered Medications Ordered in Other Visits  Medication Dose Route Frequency Provider Last Rate Last Dose  . sodium chloride 0.9 % injection 10 mL  10 mL Intracatheter PRN Deatra Robinson, MD   10 mL at 08/22/12 1721  . sodium chloride 0.9 % injection 10 mL  10 mL Intravenous PRN Deatra Robinson, MD   10 mL at 03/27/13 1225    SURGICAL HISTORY:  Past Surgical History  Procedure Laterality Date  . Femoral stents    . Coronary stent placement  May 2011  . Cholecystectomy  2007  . Carpel tunnel release Left [id number]  . Carpel tunnel release  Right 01-1989  . Tarsal tunnel release Bilateral 08-2007    REVIEW OF SYSTEMS:   General: fatigue (-), night sweats (-), fever (-), pain (-) Lymph: palpable nodes (-) HEENT: vision changes (-), mucositis (-), gum bleeding (-), epistaxis (-) Cardiovascular: chest pain (-), palpitations (-) Pulmonary: shortness of breath (-), dyspnea on exertion (-), cough (-), hemoptysis (-) GI:  Early satiety (-), melena (-), dysphagia (-), nausea/vomiting (-), diarrhea (-) GU: dysuria (-), hematuria (-), incontinence (-) Musculoskeletal: joint swelling (-), joint pain (-), back pain (-) Neuro: weakness (-), numbness (-), headache (-), confusion (-) Skin: Rash (-), lesions (-), dryness (-) Psych: depression (-),  suicidal/homicidal ideation (-), feeling of hopelessness (-)   PHYSICAL EXAMINATION:  BP 148/91  Pulse 74  Temp(Src) 97.8 F (36.6 C) (Oral)  Resp 20  Ht 6' (1.829 m)  Wt 211 lb 11.2 oz (96.026 kg)  BMI 28.71 kg/m2 General: Patient is a well appearing male in no acute distress HEENT: PERRLA, sclerae anicteric no conjunctival pallor, MMM Neck: supple, no palpable adenopathy Lungs: clear to auscultation bilaterally, no wheezes, rhonchi, or rales Cardiovascular: regular rate rhythm, S1, S2, no murmurs, rubs or gallops Abdomen: Soft, non-tender, non-distended, normoactive bowel sounds, liver is not palpable  the splenic tip is palpable  Extremities: warm and well perfused, no clubbing, cyanosis, or edema Skin: No rashes or lesions Neuro: Non-focal ECOG PERFORMANCE STATUS: 0 - Asymptomatic  LABORATORY DATA: Lab Results  Component Value Date   WBC 4.0 03/27/2013   HGB 15.1 03/27/2013   HCT 43.5 03/27/2013   MCV 95.8 03/27/2013   PLT 67* 03/27/2013      Chemistry      Component Value Date/Time   NA 140 03/27/2013 0801   NA 134* 11/02/2012 1206   K 4.0 03/27/2013 0801   K 4.1 11/02/2012 1206   CL 105 11/02/2012 1206   CL 104 08/03/2012 1229   CO2 21* 03/27/2013 0801   CO2 24 11/02/2012 1206   BUN 13.7 03/27/2013 0801   BUN 13 11/02/2012 1206   CREATININE 1.1 03/27/2013 0801   CREATININE 0.8 11/02/2012 1206      Component Value Date/Time   CALCIUM 9.3 03/27/2013 0801   CALCIUM 8.7 11/02/2012 1206   ALKPHOS 110 03/27/2013 0801   ALKPHOS 95 11/30/2012 0824   AST 16 03/27/2013 0801   AST 18 11/30/2012 0824   ALT 35 03/27/2013 0801   ALT 44 11/30/2012 0824   BILITOT 0.42 03/27/2013 0801   BILITOT 0.5 11/30/2012 0824    FINAL DIAGNOSIS Diagnosis Bone Marrow, Aspirate,Biopsy, and Clot, right iliac bone - HYPERCELLULAR BONE MARROW WITH EXTENSIVE INVOLVEMENT BY CHRONIC LYMPHOCYTIC LEUKEMIA. PERIPHERAL BLOOD: - CHRONIC LYMPHOCYTIC LEUKEMIA. Susanne Greenhouse MD Pathologist, Electronic  Signature (Case signed 11/22/2011) GROSS AND MICROSCOPIC INFORMATION Specimen Clinical Information hx CLL [jl] Source Bone Marrow, Aspirate,Biopsy, and Clot, right iliac bone Microscopic LAB DATA: CBC performed on 11/18/11 shows: WBC 80.87 K/ul Neutrophils 5% HB 15.7 g/dl Lymphocytes 94% HCT 46.1 % Monocytes 1% MCV 96.0 fL Eosinophils 0% RDW 15.4 % Basophils 0% PLT 81 K/ul PERIPHERAL BLOOD SMEAR: The red blood cells display mild anisopoikilocytosis with minimal polychromasia. The white blood cells are markedly increased in number with lymphocytosis. The lymphocytes primarily consist of small lymphoid cells with generally high nuclear cytoplasmic ratio, dense chromatin with block type clumping, and inconspicuous nucleoli. There are numerous smudge cells in the 1 of 3 FINAL for RANNY, WIEBELHAUS [phone]) Microscopic(continued) background. A significant prolymphocytic component is not present. The platelets are decreased in number. BONE MARROW ASPIRATE: Erythroid precursors: Relatively decreased with progressive maturation. Granulocytic precursors: Relatively decreased with progressive maturation. Megakaryocytes: Scattered forms are seen displaying normal morphology. Lymphocytes/plasma cells: The lymphocytes are markedly increased in number representing 95% of all cells and consist of small lymphoid cells with high nuclear cytoplasmic ratio, dense chromatin and inconspicuous nucleoli. A significant prolymphocytic or large lymphoid cell component is not present. Significant plasma cell aggregates are not present. TOUCH PREPARATIONS: Predominance of small lymphoid cells. CLOT and BIOPSY: The sections show 90% cellularity with extensive infiltration of the medullary space by numerous aggregates, interstitial infiltrates and diffuse sheets of primarily small lymphoid cells displaying high nuclear cytoplasmic ratio, dense chromatin and small to inconspicuous nucleoli. Scattered small  proliferation centers are seen. Myeloid hematopoiesis appears decreased in the background. Immunohistochemical stains for CD20, CD79a, CD3, CD5, CD10 and cyclin D1 were performed with appropriate controls. The lymphoid population is stains positively ffor CD79a (B cell marker) with patchy weaker positivity for CD20. There is patchy weak coexpression of CD5. No CD10 or cyclin D1 expression is identified. IRON STAIN: Iron stains are performed on a bone marrow aspirate smear and section of clot. The controls stained appropriately. Storage Iron: Decreased. Ringed Sideroblasts: Absent. ADDITIONAL  DATA / TESTING: Specimen was sent for cytogenetic analysis and a separate report will follow. Flow cytometric analysis of the lymphoid population ([id number]) shows a major B cell population expressing pan B cell antigens including CD20 and CD23 associated with coexpression of CD5. This population shows extremely dim/negative staining for surface immunoglobulin light chains. (BNS:gt, 11/21/11) Specimen Table Bone Marrow count performed on 500 cells shows: Blasts: 0% Myeloid 3% Promyelocyts: 0% Myelocytes: 0% Erythroid 2% Metamyelocyts: 1% Bands: 1% Lymphocytes: 95% Neutrophils: 1% Eosinophils: 0% Plasma Cells: 0% Basophils: 0% Monocytes: 0% M:E ratio: 1.5 Gross Received in [facility] are tissue fragments which aggregate 0.4 x 0.4 x 0.1 cm. The specimen is submitted in toto. Received in a separate container in [facility] are two cores of bone measuring 2.2 and 2.8 cm in length and each 0.2 cm in diameter. The specimen is submitted in toto following decalcification. (GP:eps 11/18/11) 2 of   RADIOGRAPHIC STUDIES: CT CHEST AND PELVIS WITH CONTRAST  TECHNIQUE:  Multidetector CT imaging of the abdomen was performed using the  standard protocol during bolus administration of intravenous  contrast. Multiplanar CT image reconstructions including MIPs were  obtained to evaluate the vascular anatomy.  Multidetector CT imaging  of the chest and pelvis was performed using the standard protocol  during bolus administration of intravenous contrast.  CONTRAST: 146mL OMNIPAQUE IOHEXOL 350 MG/ML SOLN  COMPARISON: CT chest abdomen pelvis dated 03/08/2012  FINDINGS:  CT CHEST FINDINGS  Lungs are essentially clear. No suspicious pulmonary nodules.  Calcified granuloma in the posterior right upper lobe (series 8/  image 49). No pleural effusion or pneumothorax.  Visualized thyroid is unremarkable.  The heart is top-normal in size. No pericardial effusion. Coronary  atherosclerosis with coronary stent. Atherosclerotic calcifications  of the aortic arch.  Right chest port.  Marked improvement in bilateral supraclavicular and axillary  lymphadenopathy, with residual tiny nodes measuring less than 5 mm.  Right subpectoral nodes measure up to 10 mm short axis (series 6/  image 21), previously 16 mm.  Mild degenerative changes of the thoracic spine.  CTA ABDOMEN and CT PELVIS FINDINGS  Atherosclerotic calcifications of the abdominal aorta and branch  vessels. Left common iliac stent. Celiac artery, SMA, and IMA remain  patent. The bilateral renal arteries are patent. Bilateral common  iliac arteries are patent. On delayed imaging, the bilateral  internal/external iliac arteries are patent.  Liver, pancreas, and adrenal glands are within normal limits.  Splenomegaly, measuring 18.7 cm in maximal craniocaudal dimension,  previously 25.2 cm.  Status post cholecystectomy. No intrahepatic or extrahepatic ductal  dilatation.  Kidneys are within normal limits. No hydronephrosis.  No evidence of bowel obstruction. Normal appendix.  No evidence of abdominopelvic ascites.  1.6 cm short axis left para-aortic node (series 6/ image 119),  previously 2.6 cm. Additional small retroperitoneal lymph nodes.  Prostate is unremarkable.  Bladder is within normal limits.  Mild degenerative changes of the lumbar  spine.  Review of the MIP images confirms the above findings.  IMPRESSION:  Left common iliac stent. Abdominal vasculature remains patent.  Improving supraclavicular and axillary lymphadenopathy. Residual  right subpectoral nodes measure up to 10 mm short axis.  Improving retroperitoneal lymphadenopathy, measuring up to 16 mm  short axis.  Improving splenomegaly, measuring 18.7 cm.     ASSESSMENT: 63 year old gentleman with  #1 CLL: Patient is now status post 6 cycles of FCR. He completed this on 06/20/2012 through 11/21/2012 for a total of 6 cycles. Overall he tolerated it well. He had no  hospitalizations during history.  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 Patient is on IVIG monthly he will continue this for now.  #3 patient had CT of the chest abdomen and pelvis performed we discussed the results today. He's had a good response to his chemotherapy. We will plan on doing scans in 3-6 months time in followup for restaging purposes  #4 neutropenia: Resolved after receiving Neulasta injection on his last visit 34 weeks ago.  #5 chronic sinus congestion: Improved. I do think IVIG is benefiting him and we will continue IVIG monthly.  #6 smoking cessation:  He will joining smoking cessation program in March.  #7 Thrombocytopenia: stable PLAN:   #1 patient will proceed with her scheduled IVIG  #2 thrombocytopenia: stable  #3 smoking cessation referral to be pursued.  #4 patient will be seen back in 4 weeks' time for next IVIG  All questions were answered. The patient knows to call the clinic with any problems, questions or concerns. We can certainly see the patient much sooner if necessary.  I spent 25 minutes counseling the patient face to face. The total time spent in the appointment was 30 minutes.  Marcy Panning, MD Medical/Oncology [facility] [phone] (beeper) [phone]  (Office)

## 2013-04-01 ENCOUNTER — Encounter: Payer: Self-pay | Admitting: Oncology

## 2013-04-01 NOTE — Progress Notes (Signed)
Put fmla form on nurse's desk °

## 2013-04-02 ENCOUNTER — Encounter: Payer: Self-pay | Admitting: Oncology

## 2013-04-03 ENCOUNTER — Encounter: Payer: Self-pay | Admitting: Oncology

## 2013-04-03 NOTE — Progress Notes (Signed)
Put fmla form in registration desk °

## 2013-04-04 ENCOUNTER — Other Ambulatory Visit: Payer: Self-pay | Admitting: Oncology

## 2013-04-04 ENCOUNTER — Telehealth: Payer: Self-pay | Admitting: *Deleted

## 2013-04-04 NOTE — Telephone Encounter (Signed)
Per staff message and POF I have scheduled appts.  JMW  

## 2013-04-24 ENCOUNTER — Ambulatory Visit (HOSPITAL_BASED_OUTPATIENT_CLINIC_OR_DEPARTMENT_OTHER): Payer: BC Managed Care – PPO

## 2013-04-24 ENCOUNTER — Other Ambulatory Visit (HOSPITAL_BASED_OUTPATIENT_CLINIC_OR_DEPARTMENT_OTHER): Payer: BC Managed Care – PPO

## 2013-04-24 ENCOUNTER — Encounter: Payer: Self-pay | Admitting: Oncology

## 2013-04-24 ENCOUNTER — Ambulatory Visit (HOSPITAL_BASED_OUTPATIENT_CLINIC_OR_DEPARTMENT_OTHER): Payer: BC Managed Care – PPO | Admitting: Oncology

## 2013-04-24 VITALS — BP 138/71 | HR 67 | Temp 98.4°F | Resp 20 | Ht 72.0 in | Wt 207.6 lb

## 2013-04-24 VITALS — BP 111/63 | HR 54 | Temp 97.7°F | Resp 18

## 2013-04-24 DIAGNOSIS — D839 Common variable immunodeficiency, unspecified: Secondary | ICD-10-CM

## 2013-04-24 DIAGNOSIS — C9110 Chronic lymphocytic leukemia of B-cell type not having achieved remission: Secondary | ICD-10-CM

## 2013-04-24 DIAGNOSIS — F17200 Nicotine dependence, unspecified, uncomplicated: Secondary | ICD-10-CM

## 2013-04-24 DIAGNOSIS — D696 Thrombocytopenia, unspecified: Secondary | ICD-10-CM

## 2013-04-24 DIAGNOSIS — J329 Chronic sinusitis, unspecified: Secondary | ICD-10-CM

## 2013-04-24 LAB — COMPREHENSIVE METABOLIC PANEL (CC13)
ALK PHOS: 99 U/L (ref 40–150)
ALT: 32 U/L (ref 0–55)
AST: 17 U/L (ref 5–34)
Albumin: 3.8 g/dL (ref 3.5–5.0)
Anion Gap: 12 mEq/L — ABNORMAL HIGH (ref 3–11)
BILIRUBIN TOTAL: 0.45 mg/dL (ref 0.20–1.20)
BUN: 11.5 mg/dL (ref 7.0–26.0)
CO2: 22 mEq/L (ref 22–29)
CREATININE: 0.9 mg/dL (ref 0.7–1.3)
Calcium: 9.2 mg/dL (ref 8.4–10.4)
Chloride: 109 mEq/L (ref 98–109)
Glucose: 202 mg/dl — ABNORMAL HIGH (ref 70–140)
Potassium: 3.6 mEq/L (ref 3.5–5.1)
SODIUM: 143 meq/L (ref 136–145)
TOTAL PROTEIN: 6.2 g/dL — AB (ref 6.4–8.3)

## 2013-04-24 LAB — CBC WITH DIFFERENTIAL/PLATELET
BASO%: 0.4 % (ref 0.0–2.0)
Basophils Absolute: 0 10*3/uL (ref 0.0–0.1)
EOS%: 0.2 % (ref 0.0–7.0)
Eosinophils Absolute: 0 10*3/uL (ref 0.0–0.5)
HCT: 43.4 % (ref 38.4–49.9)
HGB: 15.3 g/dL (ref 13.0–17.1)
LYMPH%: 2.7 % — ABNORMAL LOW (ref 14.0–49.0)
MCH: 33 pg (ref 27.2–33.4)
MCHC: 35.3 g/dL (ref 32.0–36.0)
MCV: 93.7 fL (ref 79.3–98.0)
MONO#: 0.5 10*3/uL (ref 0.1–0.9)
MONO%: 9.1 % (ref 0.0–14.0)
NEUT#: 4.9 10*3/uL (ref 1.5–6.5)
NEUT%: 87.6 % — ABNORMAL HIGH (ref 39.0–75.0)
NRBC: 0 % (ref 0–0)
Platelets: 70 10*3/uL — ABNORMAL LOW (ref 140–400)
RBC: 4.63 10*6/uL (ref 4.20–5.82)
RDW: 14 % (ref 11.0–14.6)
WBC: 5.6 10*3/uL (ref 4.0–10.3)
lymph#: 0.2 10*3/uL — ABNORMAL LOW (ref 0.9–3.3)

## 2013-04-24 MED ORDER — HEPARIN SOD (PORK) LOCK FLUSH 100 UNIT/ML IV SOLN
500.0000 [IU] | Freq: Once | INTRAVENOUS | Status: AC
  Administered 2013-04-24: 500 [IU] via INTRAVENOUS
  Filled 2013-04-24: qty 5

## 2013-04-24 MED ORDER — ACETAMINOPHEN 325 MG PO TABS
ORAL_TABLET | ORAL | Status: AC
  Filled 2013-04-24: qty 2

## 2013-04-24 MED ORDER — DIPHENHYDRAMINE HCL 25 MG PO CAPS
50.0000 mg | ORAL_CAPSULE | Freq: Once | ORAL | Status: DC
Start: 2013-04-24 — End: 2013-04-24
  Administered 2013-04-24: 50 mg via ORAL

## 2013-04-24 MED ORDER — ACETAMINOPHEN 325 MG PO TABS
650.0000 mg | ORAL_TABLET | Freq: Once | ORAL | Status: DC
  Administered 2013-04-24: 650 mg via ORAL

## 2013-04-24 MED ORDER — SODIUM CHLORIDE 0.9 % IJ SOLN
10.0000 mL | INTRAMUSCULAR | Status: DC | PRN
  Administered 2013-04-24: 10 mL via INTRAVENOUS
  Filled 2013-04-24: qty 10

## 2013-04-24 MED ORDER — DIPHENHYDRAMINE HCL 25 MG PO CAPS
ORAL_CAPSULE | ORAL | Status: AC
  Filled 2013-04-24: qty 2

## 2013-04-24 MED ORDER — IMMUNE GLOBULIN (HUMAN) 10 GM/100ML IV SOLN
45.0000 g | Freq: Once | INTRAVENOUS | Status: AC
  Administered 2013-04-24: 45 g via INTRAVENOUS
  Filled 2013-04-24: qty 450

## 2013-04-24 NOTE — Progress Notes (Signed)
OFFICE PROGRESS NOTE  CC  [name]  DIAGNOSIS: 63 year old gentleman with stage III CLL with progressive disease.   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 Patient began IVIG q. monthly  CURRENT THERAPY:  IVIG q. monthly   INTERVAL HISTORY: Ryan Copeland 63 y.o. male returns for followup visit. Overall patient is doing well. He is here for his IVIG. Patient's cough has improved. He has been attending the smoking cessation classes and iquitting tobacco is this coming Sunday. His wife is also planning on quitting with him. I do think he will have significant success. Discussed possibility of going on Wellbutrin daily started having cravings. Today she denies any headaches double vision blurring of vision fevers chills night sweats. No shortness of breath chest pains palpitations. No abdominal pain no diarrhea or constipation. She has no easy bruising or bleeding. She has no myalgias and arthralgias. No peripheral paresthesias or gait disturbances. Remainder of the 10 point review of systems is negative.  MEDICAL HISTORY: Past Medical History  Diagnosis Date  . 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 Hyperlipidemia   . Tobacco abuse   . PVD (peripheral vascular disease)   . 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 Diabetes mellitus     ALLERGIES:  is allergic to codeine.  MEDICATIONS:  Current Outpatient Prescriptions  Medication Sig Dispense Refill  . acyclovir (ZOVIRAX) 400 MG tablet Take 1 tablet (400 mg total) by mouth daily.  90 tablet  5  . ALPRAZolam (XANAX) 0.25 MG tablet Take 1 po q 6 hours prn for anxiety  90 tablet  1  . aspirin 325 MG tablet Take 325 mg by mouth daily.        . B Complex Vitamins (VITAMIN B  COMPLEX IJ) Inject as directed every 30 (thirty) days.      . ciprofloxacin (CIPRO) 500 MG tablet Take 1 tablet (500 mg total) by mouth 2 (two) times daily.  28 tablet  6  . cyanocobalamin (,VITAMIN B-12,) 1000 MCG/ML injection Inject 1 mL (1,000 mcg total) into the muscle every 30 (thirty) days.  3 mL  1  . diphenhydrAMINE (BENADRYL) 25 mg capsule Take 50 mg by mouth at bedtime as needed for itching or sleep.      Marland Kitchen esomeprazole (NEXIUM) 40 MG capsule Take 1 capsule (40 mg total) by mouth 2 (two) times daily.  180 capsule  3  . fluticasone (FLOVENT HFA) 110 MCG/ACT inhaler Inhale 1 puff into the lungs 2 (two) times daily.  2 Inhaler  12  . Ipratropium-Albuterol (COMBIVENT) 20-100 MCG/ACT AERS respimat Inhale 1 puff into the lungs every 6 (six) hours.  1 Inhaler  1  . lidocaine-prilocaine (EMLA) cream Apply topically as needed. Place cream on port site 1-2 hours prior to treatment.  30 g  6  . metFORMIN (GLUCOPHAGE-XR) 500 MG 24 hr tablet Take 500 mg by mouth 2 (two) times daily.      . metoprolol succinate (TOPROL-XL) 50 MG 24 hr tablet Take 1 tablet (50 mg total) by mouth daily with breakfast. Take with or immediately following a meal.  90 tablet  3  . nitroGLYCERIN (NITROSTAT) 0.4 MG SL tablet Place 1 tablet (0.4 mg total) under the tongue every 5 (five) minutes as  needed.  25 tablet  6  . oxyCODONE (OXY IR/ROXICODONE) 5 MG immediate release tablet Take 1 tablet (5 mg total) by mouth every 4 (four) hours as needed for pain.  30 tablet  0  . prasugrel (EFFIENT) 10 MG TABS tablet Take 1 tablet (10 mg total) by mouth daily with breakfast.  90 tablet  3  . pregabalin (LYRICA) 75 MG capsule 1 cap three times daily and 2 caps at bedtime  450 capsule  1  . rosuvastatin (CRESTOR) 5 MG tablet Take 1 tablet (5 mg total) by mouth daily.  90 tablet  3  . sildenafil (VIAGRA) 50 MG tablet Take 1 tablet (50 mg total) by mouth daily as needed for erectile dysfunction.  10 tablet  0  .  sulfamethoxazole-trimethoprim (BACTRIM DS,SEPTRA DS) 800-160 MG per tablet Take 1 tablet by mouth 3 (three) times a week.  90 tablet  5  . tetrahydrozoline 0.05 % ophthalmic solution Place 1 drop into both eyes as needed. Red eyes      . topiramate (TOPAMAX) 25 MG capsule Take 1 capsule (25 mg total) by mouth 2 (two) times daily.  180 capsule  3  . topiramate (TOPAMAX) 25 MG tablet TAKE 1 TABLET BY MOUTH TWICE DAILY  180 tablet  3  . varenicline (CHANTIX CONTINUING MONTH PAK) 1 MG tablet Take 1 tablet (1 mg total) by mouth 2 (two) times daily.  90 tablet  1   Current Facility-Administered Medications  Medication Dose Route Frequency Provider Last Rate Last Dose  . acetaminophen (TYLENOL) tablet 650 mg  650 mg Oral Once Deatra Robinson, MD      . diphenhydrAMINE (BENADRYL) capsule 50 mg  50 mg Oral Once Deatra Robinson, MD       Facility-Administered Medications Ordered in Other Visits  Medication Dose Route Frequency Provider Last Rate Last Dose  . sodium chloride 0.9 % injection 10 mL  10 mL Intracatheter PRN Deatra Robinson, MD   10 mL at 08/22/12 1721    SURGICAL HISTORY:  Past Surgical History  Procedure Laterality Date  . Femoral stents    . Coronary stent placement  May 2011  . Cholecystectomy  2007  . Carpel tunnel release Left [id number]  . Carpel tunnel release  Right 01-1989  . Tarsal tunnel release Bilateral 08-2007     PHYSICAL EXAMINATION:  BP 138/71  Pulse 67  Temp(Src) 98.4 F (36.9 C) (Oral)  Resp 20  Ht 6' (1.829 m)  Wt 207 lb 9.6 oz (94.167 kg)  BMI 28.15 kg/m2 General: Patient is a well appearing male in no acute distress HEENT: PERRLA, sclerae anicteric no conjunctival pallor, MMM Neck: supple, no palpable adenopathy Lungs: clear to auscultation bilaterally, no wheezes, rhonchi, or rales Cardiovascular: regular rate rhythm, S1, S2, no murmurs, rubs or gallops Abdomen: Soft, non-tender, non-distended, normoactive bowel sounds, liver is not palpable the splenic  tip is palpable  Extremities: warm and well perfused, no clubbing, cyanosis, or edema Skin: No rashes or lesions Neuro: Non-focal ECOG PERFORMANCE STATUS: 0 - Asymptomatic  LABORATORY DATA: Lab Results  Component Value Date   WBC 4.0 03/27/2013   HGB 15.1 03/27/2013   HCT 43.5 03/27/2013   MCV 95.8 03/27/2013   PLT 67* 03/27/2013      Chemistry      Component Value Date/Time   NA 140 03/27/2013 0801   NA 134* 11/02/2012 1206   K 4.0 03/27/2013 0801   K 4.1 11/02/2012 1206   CL 105  11/02/2012 1206   CL 104 08/03/2012 1229   CO2 21* 03/27/2013 0801   CO2 24 11/02/2012 1206   BUN 13.7 03/27/2013 0801   BUN 13 11/02/2012 1206   CREATININE 1.1 03/27/2013 0801   CREATININE 0.8 11/02/2012 1206      Component Value Date/Time   CALCIUM 9.3 03/27/2013 0801   CALCIUM 8.7 11/02/2012 1206   ALKPHOS 110 03/27/2013 0801   ALKPHOS 95 11/30/2012 0824   AST 16 03/27/2013 0801   AST 18 11/30/2012 0824   ALT 35 03/27/2013 0801   ALT 44 11/30/2012 0824   BILITOT 0.42 03/27/2013 0801   BILITOT 0.5 11/30/2012 0824    FINAL DIAGNOSIS Diagnosis Bone Marrow, Aspirate,Biopsy, and Clot, right iliac bone - HYPERCELLULAR BONE MARROW WITH EXTENSIVE INVOLVEMENT BY CHRONIC LYMPHOCYTIC LEUKEMIA. PERIPHERAL BLOOD: - CHRONIC LYMPHOCYTIC LEUKEMIA. Susanne Greenhouse MD Pathologist, Electronic Signature (Case signed 11/22/2011) GROSS AND MICROSCOPIC INFORMATION Specimen Clinical Information hx CLL [jl] Source Bone Marrow, Aspirate,Biopsy, and Clot, right iliac bone Microscopic LAB DATA: CBC performed on 11/18/11 shows: WBC 80.87 K/ul Neutrophils 5% HB 15.7 g/dl Lymphocytes 94% HCT 46.1 % Monocytes 1% MCV 96.0 fL Eosinophils 0% RDW 15.4 % Basophils 0% PLT 81 K/ul PERIPHERAL BLOOD SMEAR: The red blood cells display mild anisopoikilocytosis with minimal polychromasia. The white blood cells are markedly increased in number with lymphocytosis. The lymphocytes primarily consist of small lymphoid cells with generally high  nuclear cytoplasmic ratio, dense chromatin with block type clumping, and inconspicuous nucleoli. There are numerous smudge cells in the 1 of 3 FINAL for Ryan Copeland, Ryan Copeland) Microscopic(continued) background. A significant prolymphocytic component is not present. The platelets are decreased in number. BONE MARROW ASPIRATE: Erythroid precursors: Relatively decreased with progressive maturation. Granulocytic precursors: Relatively decreased with progressive maturation. Megakaryocytes: Scattered forms are seen displaying normal morphology. Lymphocytes/plasma cells: The lymphocytes are markedly increased in number representing 95% of all cells and consist of small lymphoid cells with high nuclear cytoplasmic ratio, dense chromatin and inconspicuous nucleoli. A significant prolymphocytic or large lymphoid cell component is not present. Significant plasma cell aggregates are not present. TOUCH PREPARATIONS: Predominance of small lymphoid cells. CLOT and BIOPSY: The sections show 90% cellularity with extensive infiltration of the medullary space by numerous aggregates, interstitial infiltrates and diffuse sheets of primarily small lymphoid cells displaying high nuclear cytoplasmic ratio, dense chromatin and small to inconspicuous nucleoli. Scattered small proliferation centers are seen. Myeloid hematopoiesis appears decreased in the background. Immunohistochemical stains for CD20, CD79a, CD3, CD5, CD10 and cyclin D1 were performed with appropriate controls. The lymphoid population is stains positively ffor CD79a (B cell marker) with patchy weaker positivity for CD20. There is patchy weak coexpression of CD5. No CD10 or cyclin D1 expression is identified. IRON STAIN: Iron stains are performed on a bone marrow aspirate smear and section of clot. The controls stained appropriately. Storage Iron: Decreased. Ringed Sideroblasts: Absent. ADDITIONAL DATA / TESTING: Specimen was sent for cytogenetic  analysis and a separate report will follow. Flow cytometric analysis of the lymphoid population ([id number]) shows a major B cell population expressing pan B cell antigens including CD20 and CD23 associated with coexpression of CD5. This population shows extremely dim/negative staining for surface immunoglobulin light chains. (BNS:gt, 11/21/11) Specimen Table Bone Marrow count performed on 500 cells shows: Blasts: 0% Myeloid 3% Promyelocyts: 0% Myelocytes: 0% Erythroid 2% Metamyelocyts: 1% Bands: 1% Lymphocytes: 95% Neutrophils: 1% Eosinophils: 0% Plasma Cells: 0% Basophils: 0% Monocytes: 0% M:E ratio: 1.5 Gross Received in [facility] are tissue fragments which aggregate  0.4 x 0.4 x 0.1 cm. The specimen is submitted in toto. Received in a separate container in [facility] are two cores of bone measuring 2.2 and 2.8 cm in length and each 0.2 cm in diameter. The specimen is submitted in toto following decalcification. (GP:eps 11/18/11) 2 of   RADIOGRAPHIC STUDIES: CT CHEST AND PELVIS WITH CONTRAST  TECHNIQUE:  Multidetector CT imaging of the abdomen was performed using the  standard protocol during bolus administration of intravenous  contrast. Multiplanar CT image reconstructions including MIPs were  obtained to evaluate the vascular anatomy. Multidetector CT imaging  of the chest and pelvis was performed using the standard protocol  during bolus administration of intravenous contrast.  CONTRAST: 152mL OMNIPAQUE IOHEXOL 350 MG/ML SOLN  COMPARISON: CT chest abdomen pelvis dated 03/08/2012  FINDINGS:  CT CHEST FINDINGS  Lungs are essentially clear. No suspicious pulmonary nodules.  Calcified granuloma in the posterior right upper lobe (series 8/  image 49). No pleural effusion or pneumothorax.  Visualized thyroid is unremarkable.  The heart is top-normal in size. No pericardial effusion. Coronary  atherosclerosis with coronary stent. Atherosclerotic calcifications  of the aortic arch.   Right chest port.  Marked improvement in bilateral supraclavicular and axillary  lymphadenopathy, with residual tiny nodes measuring less than 5 mm.  Right subpectoral nodes measure up to 10 mm short axis (series 6/  image 21), previously 16 mm.  Mild degenerative changes of the thoracic spine.  CTA ABDOMEN and CT PELVIS FINDINGS  Atherosclerotic calcifications of the abdominal aorta and branch  vessels. Left common iliac stent. Celiac artery, SMA, and IMA remain  patent. The bilateral renal arteries are patent. Bilateral common  iliac arteries are patent. On delayed imaging, the bilateral  internal/external iliac arteries are patent.  Liver, pancreas, and adrenal glands are within normal limits.  Splenomegaly, measuring 18.7 cm in maximal craniocaudal dimension,  previously 25.2 cm.  Status post cholecystectomy. No intrahepatic or extrahepatic ductal  dilatation.  Kidneys are within normal limits. No hydronephrosis.  No evidence of bowel obstruction. Normal appendix.  No evidence of abdominopelvic ascites.  1.6 cm short axis left para-aortic node (series 6/ image 119),  previously 2.6 cm. Additional small retroperitoneal lymph nodes.  Prostate is unremarkable.  Bladder is within normal limits.  Mild degenerative changes of the lumbar spine.  Review of the MIP images confirms the above findings.  IMPRESSION:  Left common iliac stent. Abdominal vasculature remains patent.  Improving supraclavicular and axillary lymphadenopathy. Residual  right subpectoral nodes measure up to 10 mm short axis.  Improving retroperitoneal lymphadenopathy, measuring up to 16 mm  short axis.  Improving splenomegaly, measuring 18.7 cm.     ASSESSMENT: 63 year old gentleman with  #1 CLL: Patient is now status post 6 cycles of FCR. He completed this on 06/20/2012 through 11/21/2012 for a total of 6 cycles. Overall he tolerated it well. He had no hospitalizations during history.  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 Patient is on IVIG monthly he will continue this for now.  #3 patient had CT of the chest abdomen and pelvis performed we discussed the results today. He's had a good response to his chemotherapy. We will plan on doing scans in 3-6 months time in followup for restaging purposes  #4 neutropenia: Resolved after receiving Neulasta injection on his last visit 34 weeks ago.  #5 chronic sinus congestion: Improved. I do think IVIG is benefiting him and we will continue IVIG monthly.  #6 smoking cessation   #  7 Thrombocytopenia: stable  PLAN:   #1 patient will proceed with her scheduled IVIG today 04/24/2013  #2 thrombocytopenia: stable  #3 patient currently enrolled in smoking cessation program  #4 patient will be seen back in 4 weeks' time for next IVIG on 05/29/2013  All questions were answered. The patient knows to call the clinic with any problems, questions or concerns. We can certainly see the patient much sooner if necessary.  I spent 25 minutes counseling the patient face to face. The total time spent in the appointment was 30 minutes.  Marcy Panning, MD Medical/Oncology [facility] [phone] (beeper) [phone] (Office)

## 2013-05-02 ENCOUNTER — Encounter: Payer: Self-pay | Admitting: Oncology

## 2013-05-03 ENCOUNTER — Encounter: Payer: Self-pay | Admitting: Nurse Practitioner

## 2013-05-03 ENCOUNTER — Ambulatory Visit (INDEPENDENT_AMBULATORY_CARE_PROVIDER_SITE_OTHER): Payer: BC Managed Care – PPO | Admitting: Nurse Practitioner

## 2013-05-03 VITALS — BP 110/78 | HR 53 | Ht 72.0 in | Wt 211.0 lb

## 2013-05-03 DIAGNOSIS — I2510 Atherosclerotic heart disease of native coronary artery without angina pectoris: Secondary | ICD-10-CM

## 2013-05-03 DIAGNOSIS — E785 Hyperlipidemia, unspecified: Secondary | ICD-10-CM

## 2013-05-03 LAB — BASIC METABOLIC PANEL
BUN: 12 mg/dL (ref 6–23)
CO2: 28 mEq/L (ref 19–32)
Calcium: 9.4 mg/dL (ref 8.4–10.5)
Chloride: 102 mEq/L (ref 96–112)
Creatinine, Ser: 0.9 mg/dL (ref 0.4–1.5)
GFR: 88.6 mL/min (ref >60.00)
Glucose, Bld: 192 mg/dL — ABNORMAL HIGH (ref 70–99)
Potassium: 4.4 mEq/L (ref 3.5–5.1)
Sodium: 138 mEq/L (ref 135–145)

## 2013-05-03 LAB — LIPID PANEL
Cholesterol: 147 mg/dL (ref 0–200)
HDL: 37.5 mg/dL — ABNORMAL LOW (ref >39.00)
LDL Cholesterol: 72 mg/dL (ref 0–99)
Total CHOL/HDL Ratio: 4
Triglycerides: 187 mg/dL — ABNORMAL HIGH (ref 0.0–149.0)
VLDL: 37.4 mg/dL (ref 0.0–40.0)

## 2013-05-03 LAB — HEPATIC FUNCTION PANEL
ALT: 46 U/L (ref 0–53)
AST: 24 U/L (ref 0–37)
Albumin: 4.2 g/dL (ref 3.5–5.2)
Alkaline Phosphatase: 85 U/L (ref 39–117)
Bilirubin, Direct: 0.1 mg/dL (ref 0.0–0.3)
Total Bilirubin: 0.5 mg/dL (ref 0.3–1.2)
Total Protein: 7 g/dL (ref 6.0–8.3)

## 2013-05-03 MED ORDER — NICOTINE 21 MG/24HR TD PT24: 21.0000 mg | MEDICATED_PATCH | Freq: Every day | TRANSDERMAL | Status: DC

## 2013-05-03 MED ORDER — SILDENAFIL CITRATE 100 MG PO TABS: 100.0000 mg | ORAL_TABLET | Freq: Every day | ORAL | Status: DC | PRN

## 2013-05-03 MED ORDER — NICOTINE 14 MG/24HR TD PT24: 14.0000 mg | MEDICATED_PATCH | Freq: Every day | TRANSDERMAL | Status: DC

## 2013-05-03 MED ORDER — NICOTINE 7 MG/24HR TD PT24: 7.0000 mg | MEDICATED_PATCH | Freq: Every day | TRANSDERMAL | Status: DC

## 2013-05-03 NOTE — Progress Notes (Signed)
Ryan Copeland Date of Birth: 19-Sep-1950 Medical Record #[id number]  History of Present Illness: [name] is seen back today for a follow up visit. Seen for Dr. Acie Fredrickson. Former patient of Dr. Susa Simmonds. Has known prior NSTEMI and stenting of the LAD in 2011. Underwent repeat cath in June of 2013 - stent was patent - has known totally occluded RCA that fills by collaterals - managed medically. Other issues include tobacco abuse, PVD with past iliac stenting, HTN, HLD, DM and CLL.  Last seen here in September of 2014. Was doing ok. Was trying to stop smoking with Chantix. Referred back to [facility] for his PVD but looks like he cancelled this visit.   Comes back today. Here alone. Doing really well. No problems. No chest pain. Not short of breath. Chantix did not work for him. He and his wife are going to the smoking cessation classes thru the hospital. Now using the patches - doing a 2 week step down approach - would like a prescription to see if insurance will cover. Would like Viagra refilled. No complaints at all noted.    Current Outpatient Prescriptions  Medication Sig Dispense Refill  . acyclovir (ZOVIRAX) 400 MG tablet Take 1 tablet (400 mg total) by mouth daily.  90 tablet  5  . ALPRAZolam (XANAX) 0.25 MG tablet Take 1 po q 6 hours prn for anxiety  90 tablet  1  . aspirin 325 MG tablet Take 325 mg by mouth daily.        . B Complex Vitamins (VITAMIN B COMPLEX IJ) Inject as directed every 30 (thirty) days.      . ciprofloxacin (CIPRO) 500 MG tablet Take 500 mg by mouth as needed.      . cyanocobalamin (,VITAMIN B-12,) 1000 MCG/ML injection Inject 1 mL (1,000 mcg total) into the muscle every 30 (thirty) days.  3 mL  1  . diphenhydrAMINE (BENADRYL) 25 mg capsule Take 50 mg by mouth at bedtime as needed for itching or sleep.      Marland Kitchen esomeprazole (NEXIUM) 40 MG capsule Take 1 capsule (40 mg total) by mouth 2 (two) times daily.  180 capsule  3  . fluticasone (FLOVENT HFA) 110 MCG/ACT inhaler Inhale  1 puff into the lungs 2 (two) times daily.  2 Inhaler  12  . Ipratropium-Albuterol (COMBIVENT) 20-100 MCG/ACT AERS respimat Inhale 1 puff into the lungs every 6 (six) hours.  1 Inhaler  1  . lidocaine-prilocaine (EMLA) cream Apply topically as needed. Place cream on port site 1-2 hours prior to treatment.  30 g  6  . metFORMIN (GLUCOPHAGE-XR) 500 MG 24 hr tablet Take 500 mg by mouth 2 (two) times daily.      . metoprolol succinate (TOPROL-XL) 50 MG 24 hr tablet Take 1 tablet (50 mg total) by mouth daily with breakfast. Take with or immediately following a meal.  90 tablet  3  . nitroGLYCERIN (NITROSTAT) 0.4 MG SL tablet Place 1 tablet (0.4 mg total) under the tongue every 5 (five) minutes as needed.  25 tablet  6  . oxyCODONE (OXY IR/ROXICODONE) 5 MG immediate release tablet Take 1 tablet (5 mg total) by mouth every 4 (four) hours as needed for pain.  30 tablet  0  . prasugrel (EFFIENT) 10 MG TABS tablet Take 1 tablet (10 mg total) by mouth daily with breakfast.  90 tablet  3  . pregabalin (LYRICA) 75 MG capsule 1 cap three times daily and 2 caps at bedtime  450  capsule  1  . rosuvastatin (CRESTOR) 5 MG tablet Take 1 tablet (5 mg total) by mouth daily.  90 tablet  3  . sildenafil (VIAGRA) 50 MG tablet Take 1 tablet (50 mg total) by mouth daily as needed for erectile dysfunction.  10 tablet  0  . sulfamethoxazole-trimethoprim (BACTRIM DS,SEPTRA DS) 800-160 MG per tablet Take 1 tablet by mouth 3 (three) times a week.  90 tablet  5  . tetrahydrozoline 0.05 % ophthalmic solution Place 1 drop into both eyes as needed. Red eyes      . topiramate (TOPAMAX) 25 MG tablet TAKE 1 TABLET BY MOUTH TWICE DAILY  180 tablet  3  . varenicline (CHANTIX CONTINUING MONTH PAK) 1 MG tablet Take 1 tablet (1 mg total) by mouth 2 (two) times daily.  90 tablet  1   No current facility-administered medications for this visit.   Facility-Administered Medications Ordered in Other Visits  Medication Dose Route Frequency  Provider Last Rate Last Dose  . sodium chloride 0.9 % injection 10 mL  10 mL Intracatheter PRN Deatra Robinson, MD   10 mL at 08/22/12 1721    Allergies  Allergen Reactions  . Codeine Hives    Pt states he can take a few, more reaction with extended doses.    Past Medical History  Diagnosis Date  . 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 Hyperlipidemia   . Tobacco abuse   . PVD (peripheral vascular disease)   . 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 Diabetes mellitus     Past Surgical History  Procedure Laterality Date  . Femoral stents    . Coronary stent placement  May 2011  . Cholecystectomy  2007  . Carpel tunnel release Left [id number]  . Carpel tunnel release  Right 01-1989  . Tarsal tunnel release Bilateral 08-2007    History  Smoking status  . Former Smoker -- 1.00 packs/day for 43 years  . Types: Cigarettes  . Quit date: 03/27/2013  Smokeless tobacco  . Former User    History  Alcohol Use No    Comment:  drinks non-alcoholic beer    Family History  Problem Relation Age of Onset  . Lung cancer Mother 11  . Cancer Mother     lung  . Heart failure Father 46  . Heart disease Father     Review of Systems: The review of systems is per the HPI.  All other systems were reviewed and are negative.  Physical Exam: BP 110/78  Pulse 53  Ht 6' (1.829 m)  Wt 211 lb (95.709 kg)  BMI 28.61 kg/m2 Patient is very pleasant and in no acute distress. Skin is warm and dry. Color is normal.  HEENT is unremarkable. Normocephalic/atraumatic. PERRL. Sclera are nonicteric. Neck is supple. No masses. No JVD. Lungs are clear. Cardiac exam shows a regular rate and rhythm. Abdomen is soft. Extremities are without edema. Gait and ROM are intact. No gross neurologic deficits noted.  Wt Readings from Last 3 Encounters:  05/03/13 211 lb (95.709 kg)  04/24/13 207 lb 9.6 oz (94.167 kg)  03/27/13 211 lb 11.2 oz (96.026 kg)      LABORATORY DATA: EKG today shows sinus bradycardia   Lab Results  Component Value Date   WBC 5.6 04/24/2013   HGB 15.3 04/24/2013   HCT 43.4 04/24/2013   PLT 70* 04/24/2013   GLUCOSE 202* 04/24/2013   CHOL 93 11/30/2012  TRIG 39.0 11/30/2012   HDL 45.00 11/30/2012   LDLDIRECT 117.6 08/22/2011   LDLCALC 40 11/30/2012   ALT 32 04/24/2013   AST 17 04/24/2013   NA 143 04/24/2013   K 3.6 04/24/2013   CL 105 11/02/2012   CREATININE 0.9 04/24/2013   BUN 11.5 04/24/2013   CO2 22 04/24/2013   INR 1.16 06/01/2012   HGBA1C  Value: 6.3 (NOTE)                                                                       According to the ADA Clinical Practice Recommendations for 2011, when HbA1c is used as a screening test:   >=6.5%   Diagnostic of Diabetes Mellitus           (if abnormal result  is confirmed)  5.7-6.4%   Increased risk of developing Diabetes Mellitus  References:Diagnosis and Classification of Diabetes Mellitus,Diabetes GSUP,1031,59(YVOPF 1):S62-S69 and Standards of Medical Care in         Diabetes - 2011,Diabetes Care,2011,34  (Suppl 1):S11-S61.* 06/28/2009   Coronary angiography:  Coronary dominance: right  Left mainstem: Normal.  Left anterior descending (LAD): The stent in the proximal LAD is widely patent throughout. There is moderate 20% narrowing in the LAD proximal to the stent. The remainder of the vessels without significant disease.  Left circumflex (LCx): The left circumflex gives rise to 2 large marginal branches. There is 20% narrowing prior to the takeoff of the first OM.  Right coronary artery (RCA): The right coronary is a dominant vessel. It has diffuse 70% disease in the proximal to mid vessel. Is occluded at the crux. There are excellent right to right and left to right collaterals.  Left ventriculography: Left ventricular systolic function is normal, LVEF is estimated at 55-65%, there is no significant mitral regurgitation  Final Conclusions:  1. Single vessel obstructive  coronary disease. 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 The stent in the proximal LAD is widely patent.  2. Normal LV function.  Recommendations: Continue medical management.  Peter Martinique  08/26/2011, 9:09 AM   Assessment / Plan: 1. CAD - no symptoms reported. Continue with current medical management. See back in 6 months.  2. HTN - BP is great - no change in therapy  3. HLD - recheck labs today  4. ED - Viagra refilled  5. Tobacco abuse - he seems committed and is actively trying to stop. Prescription for the patches sent in.   Patient is agreeable to this plan and will call if any problems develop in the interim.   Burtis Junes, RN, [place] [address] [place], [place]  [id number] [phone]

## 2013-05-03 NOTE — Patient Instructions (Signed)
We will check labs today  I will see you in 6 months  I have sent in a refill for your Viagra - do not take NTG within 24 hours of using  I have sent in prescriptions for your Nicoderm patches  Call the [place] office at [phone] if you have any questions, problems or concerns.

## 2013-05-22 ENCOUNTER — Ambulatory Visit: Payer: Self-pay

## 2013-05-22 ENCOUNTER — Other Ambulatory Visit: Payer: Self-pay

## 2013-05-29 ENCOUNTER — Encounter: Payer: Self-pay | Admitting: Oncology

## 2013-05-29 ENCOUNTER — Ambulatory Visit (HOSPITAL_BASED_OUTPATIENT_CLINIC_OR_DEPARTMENT_OTHER): Payer: BC Managed Care – PPO

## 2013-05-29 ENCOUNTER — Telehealth: Payer: Self-pay | Admitting: Oncology

## 2013-05-29 ENCOUNTER — Telehealth: Payer: Self-pay | Admitting: *Deleted

## 2013-05-29 ENCOUNTER — Ambulatory Visit (HOSPITAL_BASED_OUTPATIENT_CLINIC_OR_DEPARTMENT_OTHER): Payer: BC Managed Care – PPO | Admitting: Oncology

## 2013-05-29 VITALS — BP 130/62 | HR 58 | Temp 97.4°F | Resp 18

## 2013-05-29 VITALS — BP 135/70 | HR 72 | Temp 97.7°F | Resp 18 | Ht 72.0 in | Wt 214.3 lb

## 2013-05-29 DIAGNOSIS — F17200 Nicotine dependence, unspecified, uncomplicated: Secondary | ICD-10-CM

## 2013-05-29 DIAGNOSIS — D839 Common variable immunodeficiency, unspecified: Secondary | ICD-10-CM

## 2013-05-29 DIAGNOSIS — C9110 Chronic lymphocytic leukemia of B-cell type not having achieved remission: Secondary | ICD-10-CM

## 2013-05-29 DIAGNOSIS — D696 Thrombocytopenia, unspecified: Secondary | ICD-10-CM

## 2013-05-29 DIAGNOSIS — Z95828 Presence of other vascular implants and grafts: Secondary | ICD-10-CM

## 2013-05-29 DIAGNOSIS — D801 Nonfamilial hypogammaglobulinemia: Secondary | ICD-10-CM

## 2013-05-29 DIAGNOSIS — Z720 Tobacco use: Secondary | ICD-10-CM

## 2013-05-29 LAB — CBC WITH DIFFERENTIAL/PLATELET
BASO%: 0.2 % (ref 0.0–2.0)
Basophils Absolute: 0 10*3/uL (ref 0.0–0.1)
EOS%: 0.5 % (ref 0.0–7.0)
Eosinophils Absolute: 0 10*3/uL (ref 0.0–0.5)
HEMATOCRIT: 41.3 % (ref 38.4–49.9)
HGB: 14.5 g/dL (ref 13.0–17.1)
LYMPH%: 4.2 % — ABNORMAL LOW (ref 14.0–49.0)
MCH: 33.4 pg (ref 27.2–33.4)
MCHC: 35.1 g/dL (ref 32.0–36.0)
MCV: 95.2 fL (ref 79.3–98.0)
MONO#: 0.4 10*3/uL (ref 0.1–0.9)
MONO%: 8.2 % (ref 0.0–14.0)
NEUT#: 3.7 10*3/uL (ref 1.5–6.5)
NEUT%: 86.9 % — AB (ref 39.0–75.0)
PLATELETS: 70 10*3/uL — AB (ref 140–400)
RBC: 4.34 10*6/uL (ref 4.20–5.82)
RDW: 14.3 % (ref 11.0–14.6)
WBC: 4.3 10*3/uL (ref 4.0–10.3)
lymph#: 0.2 10*3/uL — ABNORMAL LOW (ref 0.9–3.3)

## 2013-05-29 LAB — COMPREHENSIVE METABOLIC PANEL (CC13)
ALT: 28 U/L (ref 0–55)
AST: 14 U/L (ref 5–34)
Albumin: 3.8 g/dL (ref 3.5–5.0)
Alkaline Phosphatase: 100 U/L (ref 40–150)
Anion Gap: 9 mEq/L (ref 3–11)
BUN: 12.3 mg/dL (ref 7.0–26.0)
CO2: 24 mEq/L (ref 22–29)
Calcium: 9.3 mg/dL (ref 8.4–10.4)
Chloride: 106 mEq/L (ref 98–109)
Creatinine: 1 mg/dL (ref 0.7–1.3)
Glucose: 371 mg/dl — ABNORMAL HIGH (ref 70–140)
Potassium: 4.2 mEq/L (ref 3.5–5.1)
Sodium: 139 mEq/L (ref 136–145)
Total Bilirubin: 0.56 mg/dL (ref 0.20–1.20)
Total Protein: 6.4 g/dL (ref 6.4–8.3)

## 2013-05-29 MED ORDER — HEPARIN SOD (PORK) LOCK FLUSH 100 UNIT/ML IV SOLN
500.0000 [IU] | Freq: Once | INTRAVENOUS | Status: AC
Start: 2013-05-29 — End: 2013-05-29
  Administered 2013-05-29: 500 [IU] via INTRAVENOUS
  Filled 2013-05-29: qty 5

## 2013-05-29 MED ORDER — DIPHENHYDRAMINE HCL 25 MG PO TABS
50.0000 mg | ORAL_TABLET | Freq: Once | ORAL | Status: DC
  Administered 2013-05-29: 50 mg via ORAL
  Filled 2013-05-29: qty 2

## 2013-05-29 MED ORDER — IMMUNE GLOBULIN (HUMAN) 10 GM/100ML IV SOLN
50.0000 g | Freq: Once | INTRAVENOUS | Status: AC
Start: 2013-05-29 — End: 2013-05-29
  Administered 2013-05-29: 50 g via INTRAVENOUS
  Filled 2013-05-29: qty 500

## 2013-05-29 MED ORDER — ACETAMINOPHEN 325 MG PO TABS
ORAL_TABLET | ORAL | Status: AC
  Filled 2013-05-29: qty 2

## 2013-05-29 MED ORDER — IMMUNE GLOBULIN (HUMAN) 10 GM/100ML IV SOLN
50.0000 g | Freq: Once | INTRAVENOUS | Status: DC
  Filled 2013-05-29: qty 500

## 2013-05-29 MED ORDER — DIPHENHYDRAMINE HCL 25 MG PO CAPS
ORAL_CAPSULE | ORAL | Status: AC
  Filled 2013-05-29: qty 2

## 2013-05-29 MED ORDER — SODIUM CHLORIDE 0.9 % IJ SOLN
10.0000 mL | INTRAMUSCULAR | Status: DC | PRN
  Administered 2013-05-29 (×2): 10 mL via INTRAVENOUS
  Filled 2013-05-29: qty 10

## 2013-05-29 MED ORDER — ACETAMINOPHEN 325 MG PO TABS
650.0000 mg | ORAL_TABLET | Freq: Once | ORAL | Status: DC
  Administered 2013-05-29: 650 mg via ORAL

## 2013-05-29 NOTE — Patient Instructions (Signed)

## 2013-05-29 NOTE — Progress Notes (Signed)
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 Patient began IVIG q. monthly   CURRENT THERAPY: IVIG q. monthly    INTERVAL HISTORY: Ryan Copeland 63 y.o. male returns for followup visit today. Clinically he seems to be doing well. He tells me that approximately a few days prior to the scheduled IVIG he starts to feel like he is coming on with a cold. And when she receives the IVIG it does get better. This basically tells me that he needs to have ongoing IVIG treatments. Patient also has reduce the amount of smoking he is doing. He does have nicotine patches on currently. His wife is also on board with this plan of quitting smoking. He is denying otherwise any nausea vomiting fevers chills night sweats. He has not noticed any easy bruising no palpable lymphadenopathy. Remainder of the 10 point review of systems is negative and as noted below  Past Medical History  Diagnosis Date  . 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 Hyperlipidemia   . Tobacco abuse   . PVD (peripheral vascular disease)   . 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 Diabetes mellitus    Past Surgical History  Procedure Laterality Date  . Femoral stents    . Coronary stent placement  May 2011  . Cholecystectomy  2007  . Carpel tunnel release Left [id number]  . Carpel tunnel release  Right 01-1989  . Tarsal tunnel release Bilateral 08-2007   History   Social History  . Marital Status: Married    Spouse Name: [name]    Number of Children: 1  . Years of Education: College   Occupational  History  .      [employer]   Social History Main Topics  . Smoking status: Former Smoker -- 1.00 packs/day for 43 years    Types: Cigarettes    Quit date: 03/27/2013  . Smokeless tobacco: Former Systems developer  . Alcohol Use: No     Comment:  drinks non-alcoholic beer  . Drug Use: No  . Sexual Activity: Yes   Other Topics Concern  . Not on file   Social History Narrative   Patient lives at home with wife.   Caffeine Use: 15 cups of caffeine weekly     ALLERGIES:  is allergic to codeine.  MEDICATIONS:  Current Outpatient Prescriptions  Medication Sig Dispense Refill  . acyclovir (ZOVIRAX) 400 MG tablet Take 1 tablet (400 mg total) by mouth daily.  90 tablet  5  . ALPRAZolam (XANAX) 0.25 MG tablet Take 1 po q 6 hours prn for anxiety  90 tablet  1  . aspirin 325 MG tablet Take 325 mg by mouth daily.        . ciprofloxacin (CIPRO) 500 MG tablet Take 500 mg by mouth as needed.      . cyanocobalamin (,VITAMIN B-12,) 1000 MCG/ML injection Inject 1 mL (1,000 mcg total) into the muscle every 30 (thirty) days.  3 mL  1  . diphenhydrAMINE (BENADRYL) 25 mg capsule Take 50 mg by mouth at bedtime as needed for itching or sleep.      Marland Kitchen  esomeprazole (NEXIUM) 40 MG capsule Take 1 capsule (40 mg total) by mouth 2 (two) times daily.  180 capsule  3  . fluticasone (FLOVENT HFA) 110 MCG/ACT inhaler Inhale 1 puff into the lungs 2 (two) times daily.  2 Inhaler  12  . Ipratropium-Albuterol (COMBIVENT) 20-100 MCG/ACT AERS respimat Inhale 1 puff into the lungs every 6 (six) hours.  1 Inhaler  1  . lidocaine-prilocaine (EMLA) cream Apply topically as needed. Place cream on port site 1-2 hours prior to treatment.  30 g  6  . metFORMIN (GLUCOPHAGE-XR) 500 MG 24 hr tablet Take 500 mg by mouth 2 (two) times daily.      . metoprolol succinate (TOPROL-XL) 50 MG 24 hr tablet Take 1 tablet (50 mg total) by mouth daily with breakfast. Take with or immediately following a meal.  90 tablet  3  . nicotine (NICODERM CQ)  14 mg/24hr patch Place 1 patch (14 mg total) onto the skin daily.  14 patch  0  . nicotine (NICODERM CQ) 21 mg/24hr patch Place 1 patch (21 mg total) onto the skin daily.  14 patch  0  . nicotine (NICODERM CQ) 7 mg/24hr patch Place 1 patch (7 mg total) onto the skin daily.  14 patch  0  . nitroGLYCERIN (NITROSTAT) 0.4 MG SL tablet Place 1 tablet (0.4 mg total) under the tongue every 5 (five) minutes as needed.  25 tablet  6  . oxyCODONE (OXY IR/ROXICODONE) 5 MG immediate release tablet Take 1 tablet (5 mg total) by mouth every 4 (four) hours as needed for pain.  30 tablet  0  . prasugrel (EFFIENT) 10 MG TABS tablet Take 1 tablet (10 mg total) by mouth daily with breakfast.  90 tablet  3  . pregabalin (LYRICA) 75 MG capsule 1 cap three times daily and 2 caps at bedtime  450 capsule  1  . rosuvastatin (CRESTOR) 5 MG tablet Take 1 tablet (5 mg total) by mouth daily.  90 tablet  3  . sildenafil (VIAGRA) 100 MG tablet Take 1 tablet (100 mg total) by mouth daily as needed for erectile dysfunction.  10 tablet  6  . sulfamethoxazole-trimethoprim (BACTRIM DS,SEPTRA DS) 800-160 MG per tablet Take 1 tablet by mouth 3 (three) times a week.  90 tablet  5  . tetrahydrozoline 0.05 % ophthalmic solution Place 1 drop into both eyes as needed. Red eyes      . topiramate (TOPAMAX) 25 MG tablet TAKE 1 TABLET BY MOUTH TWICE DAILY  180 tablet  3   No current facility-administered medications for this visit.   Facility-Administered Medications Ordered in Other Visits  Medication Dose Route Frequency Provider Last Rate Last Dose  . sodium chloride 0.9 % injection 10 mL  10 mL Intracatheter PRN Deatra Robinson, MD   10 mL at 08/22/12 1721  . sodium chloride 0.9 % injection 10 mL  10 mL Intravenous PRN Deatra Robinson, MD   10 mL at 05/29/13 0815    REVIEW OF SYSTEMS:   Constitutional: Denies fevers, chills or abnormal weight loss Eyes: Denies blurriness of vision Ears, nose, mouth, throat, and face: Denies mucositis or  sore throat Respiratory: Denies cough, dyspnea or wheezes Cardiovascular: Denies palpitation, chest discomfort or lower extremity swelling Gastrointestinal:  Denies nausea, heartburn or change in bowel habits Skin: Denies abnormal skin rashes Lymphatics: Denies new lymphadenopathy or easy bruising Neurological:Denies numbness, tingling or new weaknesses Behavioral/Psych: Mood is stable, no new changes  All other systems were reviewed  with the patient and are negative.  PHYSICAL EXAMINATION: ECOG PERFORMANCE STATUS: 1 - Symptomatic but completely ambulatory  Filed Vitals:   05/29/13 0836  BP: 135/70  Pulse: 72  Temp: 97.7 F (36.5 C)  Resp: 18   Filed Weights   05/29/13 0836  Weight: 214 lb 4.8 oz (97.206 kg)    GENERAL:alert, no distress and comfortable SKIN: skin color, texture, turgor are normal, no rashes or significant lesions EYES: normal, Conjunctiva are pink and non-injected, sclera clear OROPHARYNX:no exudate, no erythema and lips, buccal mucosa, and tongue normal  NECK: supple, thyroid normal size, non-tender, without nodularity LYMPH:  no palpable lymphadenopathy in the cervical, axillary or inguinal LUNGS: clear to auscultation and percussion with normal breathing effort HEART: regular rate & rhythm and no murmurs and no lower extremity edema ABDOMEN:abdomen soft, non-tender and normal bowel sounds Musculoskeletal:no cyanosis of digits and no clubbing  NEURO: alert & oriented x 3 with fluent speech, no focal motor/sensory deficits  LABORATORY DATA:  I have reviewed the data as listed    Component Value Date/Time   NA 139 05/29/2013 0807   NA 138 05/03/2013 1359   K 4.2 05/29/2013 0807   K 4.4 05/03/2013 1359   CL 102 05/03/2013 1359   CL 104 08/03/2012 1229   CO2 24 05/29/2013 0807   CO2 28 05/03/2013 1359   GLUCOSE 371* 05/29/2013 0807   GLUCOSE 192* 05/03/2013 1359   GLUCOSE 223* 08/03/2012 1229   BUN 12.3 05/29/2013 0807   BUN 12 05/03/2013 1359   CREATININE  1.0 05/29/2013 0807   CREATININE 0.9 05/03/2013 1359   CALCIUM 9.3 05/29/2013 0807   CALCIUM 9.4 05/03/2013 1359   PROT 6.4 05/29/2013 0807   PROT 7.0 05/03/2013 1359   ALBUMIN 3.8 05/29/2013 0807   ALBUMIN 4.2 05/03/2013 1359   AST 14 05/29/2013 0807   AST 24 05/03/2013 1359   ALT 28 05/29/2013 0807   ALT 46 05/03/2013 1359   ALKPHOS 100 05/29/2013 0807   ALKPHOS 85 05/03/2013 1359   BILITOT 0.56 05/29/2013 0807   BILITOT 0.5 05/03/2013 1359   GFRNONAA 88* 06/01/2012 1145   GFRAA >90 06/01/2012 1145    No results found for this basename: SPEP, UPEP,  kappa and lambda light chains    Lab Results  Component Value Date   WBC 4.3 05/29/2013   NEUTROABS 3.7 05/29/2013   HGB 14.5 05/29/2013   HCT 41.3 05/29/2013   MCV 95.2 05/29/2013   PLT 70* 05/29/2013      Chemistry      Component Value Date/Time   NA 139 05/29/2013 0807   NA 138 05/03/2013 1359   K 4.2 05/29/2013 0807   K 4.4 05/03/2013 1359   CL 102 05/03/2013 1359   CL 104 08/03/2012 1229   CO2 24 05/29/2013 0807   CO2 28 05/03/2013 1359   BUN 12.3 05/29/2013 0807   BUN 12 05/03/2013 1359   CREATININE 1.0 05/29/2013 0807   CREATININE 0.9 05/03/2013 1359      Component Value Date/Time   CALCIUM 9.3 05/29/2013 0807   CALCIUM 9.4 05/03/2013 1359   ALKPHOS 100 05/29/2013 0807   ALKPHOS 85 05/03/2013 1359   AST 14 05/29/2013 0807   AST 24 05/03/2013 1359   ALT 28 05/29/2013 0807   ALT 46 05/03/2013 1359   BILITOT 0.56 05/29/2013 0807   BILITOT 0.5 05/03/2013 1359       RADIOGRAPHIC STUDIES: I have personally reviewed the radiological images as listed and agreed  with the findings in the report. No results found.    ASSESSMENT & PLAN:  63 year old gentleman with   #1 CLL status post 6 cycles of FCR he completed this from 06/20/2012 through 11/21/2012 for a total of 6 cycles. He tolerated his treatment well. He has no evidence of recurrent disease.  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 He is now on chronic IVIG.  He will proceed with scheduled treatment today.  #3 thrombocytopenia: His platelets have improved it is now 70,000. This may be secondary to splenomegaly. However on examination his spleen is better.  #4 tobacco abuse: Patient is attending smoking cessation classes and he is now on nicotine patches. He is very pleased with his results.  #5 followup: Patient will be seen back in one month for next IVIG. He understands risks benefits and side effects of IVIG therapy. He does receive Benadryl and Tylenol prior to receiving his IVIG and orders were placed accordingly into the computer system  No orders of the defined types were placed in this encounter.   All questions were answered. The patient knows to call the clinic with any problems, questions or concerns. No barriers to learning was detected. I spent 20 minutes counseling the patient face to face. The total time spent in the appointment was 25 minutes and more than 50% was on counseling and review of test results     Deatra Robinson, MD 05/29/2013 8:52 AM

## 2013-05-29 NOTE — Patient Instructions (Signed)

## 2013-05-29 NOTE — Telephone Encounter (Signed)
, °

## 2013-05-29 NOTE — Telephone Encounter (Signed)
Per staff message and POF I have scheduled appts.  JMW  

## 2013-05-29 NOTE — Patient Instructions (Signed)
Proceed with IVIG today and we will see you back in 1 month

## 2013-06-05 ENCOUNTER — Other Ambulatory Visit: Payer: Self-pay | Admitting: Nurse Practitioner

## 2013-06-07 MED ORDER — NICOTINE 21 MG/24HR TD PT24: 21.0000 mg | MEDICATED_PATCH | Freq: Every day | TRANSDERMAL | Status: DC

## 2013-06-07 NOTE — Telephone Encounter (Signed)
Message copied by Fernande Boyden on Fri Jun 07, 2013 12:00 PM ------      Message from: Burtis Junes      Created: Fri Jun 07, 2013  7:32 AM      Regarding: RE: refill       That's ok.            lori      ----- Message -----         From: Fernande Boyden, RN         Sent: 06/06/2013   3:19 PM           To: Burtis Junes, NP      Subject: refill                                                   Patient wants refill on nicoderm CQ 21mg /24 hour patch?      Thanks, Lovett Sox, RN            [facility]      [address]      [place]       ------

## 2013-06-14 ENCOUNTER — Other Ambulatory Visit: Payer: Self-pay | Admitting: Hematology and Oncology

## 2013-06-14 ENCOUNTER — Telehealth: Payer: Self-pay | Admitting: Hematology and Oncology

## 2013-06-14 DIAGNOSIS — C9110 Chronic lymphocytic leukemia of B-cell type not having achieved remission: Secondary | ICD-10-CM

## 2013-06-14 NOTE — Telephone Encounter (Signed)
, °

## 2013-06-20 ENCOUNTER — Telehealth: Payer: Self-pay | Admitting: Nurse Practitioner

## 2013-06-26 ENCOUNTER — Encounter: Payer: Self-pay | Admitting: Hematology and Oncology

## 2013-06-26 ENCOUNTER — Other Ambulatory Visit (HOSPITAL_BASED_OUTPATIENT_CLINIC_OR_DEPARTMENT_OTHER): Payer: BC Managed Care – PPO

## 2013-06-26 ENCOUNTER — Ambulatory Visit: Payer: Self-pay

## 2013-06-26 ENCOUNTER — Ambulatory Visit (HOSPITAL_BASED_OUTPATIENT_CLINIC_OR_DEPARTMENT_OTHER): Payer: BC Managed Care – PPO | Admitting: Hematology and Oncology

## 2013-06-26 ENCOUNTER — Ambulatory Visit: Payer: Self-pay | Admitting: Oncology

## 2013-06-26 ENCOUNTER — Ambulatory Visit: Payer: Self-pay | Admitting: Nurse Practitioner

## 2013-06-26 ENCOUNTER — Telehealth: Payer: Self-pay | Admitting: Hematology and Oncology

## 2013-06-26 ENCOUNTER — Other Ambulatory Visit: Payer: Self-pay

## 2013-06-26 VITALS — BP 133/66 | HR 55 | Temp 97.1°F | Resp 16 | Wt 212.0 lb

## 2013-06-26 DIAGNOSIS — C9110 Chronic lymphocytic leukemia of B-cell type not having achieved remission: Secondary | ICD-10-CM

## 2013-06-26 DIAGNOSIS — R05 Cough: Secondary | ICD-10-CM

## 2013-06-26 DIAGNOSIS — R059 Cough, unspecified: Secondary | ICD-10-CM

## 2013-06-26 DIAGNOSIS — D696 Thrombocytopenia, unspecified: Secondary | ICD-10-CM

## 2013-06-26 DIAGNOSIS — D801 Nonfamilial hypogammaglobulinemia: Secondary | ICD-10-CM

## 2013-06-26 DIAGNOSIS — F17200 Nicotine dependence, unspecified, uncomplicated: Secondary | ICD-10-CM

## 2013-06-26 DIAGNOSIS — Z23 Encounter for immunization: Secondary | ICD-10-CM

## 2013-06-26 DIAGNOSIS — E119 Type 2 diabetes mellitus without complications: Secondary | ICD-10-CM

## 2013-06-26 LAB — COMPREHENSIVE METABOLIC PANEL (CC13)
ALBUMIN: 4 g/dL (ref 3.5–5.0)
ALK PHOS: 90 U/L (ref 40–150)
ALT: 37 U/L (ref 0–55)
AST: 16 U/L (ref 5–34)
Anion Gap: 9 mEq/L (ref 3–11)
BUN: 11.9 mg/dL (ref 7.0–26.0)
CO2: 23 mEq/L (ref 22–29)
CREATININE: 1 mg/dL (ref 0.7–1.3)
Calcium: 9.5 mg/dL (ref 8.4–10.4)
Chloride: 111 mEq/L — ABNORMAL HIGH (ref 98–109)
GLUCOSE: 119 mg/dL (ref 70–140)
POTASSIUM: 4 meq/L (ref 3.5–5.1)
Sodium: 143 mEq/L (ref 136–145)
Total Bilirubin: 0.51 mg/dL (ref 0.20–1.20)
Total Protein: 6.6 g/dL (ref 6.4–8.3)

## 2013-06-26 LAB — CBC WITH DIFFERENTIAL/PLATELET
BASO%: 0.2 % (ref 0.0–2.0)
BASOS ABS: 0 10*3/uL (ref 0.0–0.1)
EOS ABS: 0.1 10*3/uL (ref 0.0–0.5)
EOS%: 1.2 % (ref 0.0–7.0)
HCT: 41 % (ref 38.4–49.9)
HEMOGLOBIN: 14.4 g/dL (ref 13.0–17.1)
LYMPH%: 4.3 % — ABNORMAL LOW (ref 14.0–49.0)
MCH: 33.4 pg (ref 27.2–33.4)
MCHC: 35.1 g/dL (ref 32.0–36.0)
MCV: 95.1 fL (ref 79.3–98.0)
MONO#: 0.5 10*3/uL (ref 0.1–0.9)
MONO%: 11.6 % (ref 0.0–14.0)
NEUT#: 3.4 10*3/uL (ref 1.5–6.5)
NEUT%: 82.7 % — ABNORMAL HIGH (ref 39.0–75.0)
Platelets: 85 10*3/uL — ABNORMAL LOW (ref 140–400)
RBC: 4.31 10*6/uL (ref 4.20–5.82)
RDW: 14.4 % (ref 11.0–14.6)
WBC: 4.1 10*3/uL (ref 4.0–10.3)
lymph#: 0.2 10*3/uL — ABNORMAL LOW (ref 0.9–3.3)

## 2013-06-26 LAB — CHCC SMEAR

## 2013-06-26 MED ORDER — PNEUMOCOCCAL VAC POLYVALENT 25 MCG/0.5ML IJ INJ
0.5000 mL | INJECTION | Freq: Once | INTRAMUSCULAR | Status: AC
  Administered 2013-06-26: 0.5 mL via INTRAMUSCULAR
  Filled 2013-06-26: qty 0.5

## 2013-06-26 NOTE — Telephone Encounter (Signed)
That is ok to refill.

## 2013-06-26 NOTE — Telephone Encounter (Signed)
gv adn printed appt sched and avs for pt for June °

## 2013-06-27 ENCOUNTER — Other Ambulatory Visit: Payer: Self-pay | Admitting: *Deleted

## 2013-06-27 ENCOUNTER — Telehealth: Payer: Self-pay | Admitting: *Deleted

## 2013-06-27 LAB — IGG, IGA, IGM
IGA: 69 mg/dL (ref 68–379)
IGG (IMMUNOGLOBIN G), SERUM: 772 mg/dL (ref 650–1600)
IgM, Serum: 20 mg/dL — ABNORMAL LOW (ref 41–251)

## 2013-06-27 MED ORDER — NICOTINE 21 MG/24HR TD PT24: 21.0000 mg | MEDICATED_PATCH | Freq: Every day | TRANSDERMAL | Status: DC

## 2013-06-27 NOTE — Telephone Encounter (Signed)
Informed pt of Immunoglobulin levels improving per Dr. Alvy Bimler and to keep appt in June as scheduled. He verbalized understanding.

## 2013-06-27 NOTE — Progress Notes (Signed)
[facility] progress notes  Patient Care Team: [name], PA-C as PCP - General (Family Medicine)  CHIEF COMPLAINTS/PURPOSE OF VISIT:  CLL with chronic hypogammaglobulinemia  HISTORY OF PRESENTING ILLNESS:  Ryan Copeland 63 y.o. male was transferred to my care after his prior physician has left.  I reviewed the patient's records extensive and collaborated the history with the patient. Summary of his history is as follows: This patient was originally diagnosed with CLL after he was found to have leukocytosis. Imaging study with CT scan show diffuse lymphadenopathy with splenomegaly. Due to worsening thrombocytopenia, he was subsequently treated with combination therapy with fludarabine, Cytoxan and rituximab from May 2014 to October 2014. The patient denies side effects or complications from treatment. He has significant smoking history with suspected COPD. 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 From October 2014 onwards, he was prescribed monthly IVIG infusion.  The patient felt that with each IVIG infusion, he would feel better for about 3 days. He continues on nicotine cessation effort with his wife. He denies any new lymphadenopathy. Denies any recent infection. He still has chronic productive cough with clear sputum. He denies any recent fever, chills, night sweats or abnormal weight loss   MEDICAL HISTORY:  Past Medical History  Diagnosis Date  . 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 Hyperlipidemia   . Tobacco abuse   . PVD (peripheral vascular disease)   . 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 Diabetes mellitus   . Neuromuscular disorder     peripheral neuropathy    SURGICAL HISTORY: Past Surgical History  Procedure Laterality Date  . Femoral stents    . Coronary stent placement  May 2011  . Cholecystectomy  2007  . Carpel tunnel release  Left [id number]  . Carpel tunnel release  Right 01-1989  . Tarsal tunnel release Bilateral 08-2007    SOCIAL HISTORY: History   Social History  . Marital Status: Married    Spouse Name: [name]    Number of Children: 1  . Years of Education: College   Occupational History  .      [employer]   Social History Main Topics  . Smoking status: Former Smoker -- 1.00 packs/day for 43 years    Types: Cigarettes    Quit date: 03/27/2013  . Smokeless tobacco: Former Systems developer  . Alcohol Use: No     Comment:  drinks non-alcoholic beer  . Drug Use: No  . Sexual Activity: Yes   Other Topics Concern  . Not on file   Social History Narrative   Patient lives at home with wife.   Caffeine Use: 15 cups of caffeine weekly    FAMILY HISTORY: Family History  Problem Relation Age of Onset  . Lung cancer Mother 96  . Cancer Mother     lung  . Heart failure Father 35  . Heart disease Father     ALLERGIES:  is allergic to codeine.  MEDICATIONS:  Current Outpatient Prescriptions  Medication Sig Dispense Refill  . acyclovir (ZOVIRAX) 400 MG tablet Take 1 tablet (400 mg total) by mouth daily.  90 tablet  5  . ALPRAZolam (XANAX) 0.25 MG tablet Take 1 po q 6 hours prn for anxiety  90 tablet  1  . aspirin 325 MG tablet Take 325 mg by mouth daily.        . cyanocobalamin (,VITAMIN B-12,) 1000 MCG/ML injection  Inject 1 mL (1,000 mcg total) into the muscle every 30 (thirty) days.  3 mL  1  . esomeprazole (NEXIUM) 40 MG capsule Take 1 capsule (40 mg total) by mouth 2 (two) times daily.  180 capsule  3  . metFORMIN (GLUCOPHAGE-XR) 500 MG 24 hr tablet Take 500 mg by mouth 2 (two) times daily.      . metoprolol succinate (TOPROL-XL) 50 MG 24 hr tablet Take 1 tablet (50 mg total) by mouth daily with breakfast. Take with or immediately following a meal.  90 tablet  3  . nicotine (NICODERM CQ) 21 mg/24hr patch Place 1 patch (21 mg total) onto the skin daily.  14 patch  0  . pregabalin (LYRICA) 75 MG  capsule 1 cap three times daily and 2 caps at bedtime  450 capsule  1  . rosuvastatin (CRESTOR) 5 MG tablet Take 1 tablet (5 mg total) by mouth daily.  90 tablet  3  . sulfamethoxazole-trimethoprim (BACTRIM DS,SEPTRA DS) 800-160 MG per tablet Take 1 tablet by mouth 3 (three) times a week.  90 tablet  5  . tetrahydrozoline 0.05 % ophthalmic solution Place 1 drop into both eyes as needed. Red eyes      . topiramate (TOPAMAX) 25 MG tablet TAKE 1 TABLET BY MOUTH TWICE DAILY  180 tablet  3   No current facility-administered medications for this visit.   Facility-Administered Medications Ordered in Other Visits  Medication Dose Route Frequency Provider Last Rate Last Dose  . sodium chloride 0.9 % injection 10 mL  10 mL Intracatheter PRN Deatra Robinson, MD   10 mL at 08/22/12 1721  . sodium chloride 0.9 % injection 10 mL  10 mL Intravenous PRN Deatra Robinson, MD   10 mL at 05/29/13 1323    REVIEW OF SYSTEMS:   Constitutional: Denies fevers, chills or abnormal night sweats Eyes: Denies blurriness of vision, double vision or watery eyes Ears, nose, mouth, throat, and face: Denies mucositis or sore throat Cardiovascular: Denies palpitation, chest discomfort or lower extremity swelling Gastrointestinal:  Denies nausea, heartburn or change in bowel habits Skin: Denies abnormal skin rashes Lymphatics: Denies new lymphadenopathy or easy bruising Neurological:Denies numbness, tingling or new weaknesses Behavioral/Psych: Mood is stable, no new changes  All other systems were reviewed with the patient and are negative.  PHYSICAL EXAMINATION: ECOG PERFORMANCE STATUS: 0 - Asymptomatic  Filed Vitals:   06/26/13 1430  BP: 133/66  Pulse: 55  Temp: 97.1 F (36.2 C)  Resp: 16   Filed Weights   06/26/13 1430  Weight: 212 lb (96.163 kg)    GENERAL:alert, no distress and comfortable SKIN: skin color, texture, turgor are normal, no rashes or significant lesions EYES: normal, conjunctiva are pink and  non-injected, sclera clear OROPHARYNX:no exudate, normal lips, buccal mucosa, and tongue  NECK: supple, thyroid normal size, non-tender, without nodularity LYMPH:  no palpable lymphadenopathy in the cervical, axillary or inguinal LUNGS: clear to auscultation and percussion with normal breathing effort HEART: regular rate & rhythm and no murmurs without lower extremity edema ABDOMEN:abdomen soft, non-tender and normal bowel sounds. No palpable splenomegaly  Musculoskeletal:no cyanosis of digits and no clubbing  PSYCH: alert & oriented x 3 with fluent speech NEURO: no focal motor/sensory deficits  LABORATORY DATA:  I have reviewed the data as listed Lab Results  Component Value Date   WBC 4.1 06/26/2013   HGB 14.4 06/26/2013   HCT 41.0 06/26/2013   MCV 95.1 06/26/2013   PLT 85* 06/26/2013  Recent Labs  08/03/12 1229  11/02/12 1206  11/30/12 0824  05/03/13 1359 05/29/13 0807 06/26/13 1412  NA 139  < > 134*  < >  --   < > 138 139 143  K 4.3  < > 4.1  < >  --   < > 4.4 4.2 4.0  CL 104  --  105  --   --   --  102  --   --   CO2 25  < > 24  < >  --   < > 28 24 23   GLUCOSE 223*  < > 261*  < >  --   < > 192* 371* 119  BUN 15.9  < > 13  < >  --   < > 12 12.3 11.9  CREATININE 0.9  < > 0.8  < >  --   < > 0.9 1.0 1.0  CALCIUM 9.4  < > 8.7  < >  --   < > 9.4 9.3 9.5  PROT 5.9*  < > 6.3  < > 6.4  < > 7.0 6.4 6.6  ALBUMIN 3.7  < > 3.8  < > 3.7  < > 4.2 3.8 4.0  AST 31  < > 30  < > 18  < > 24 14 16   ALT 116*  < > 74*  < > 44  < > 46 28 37  ALKPHOS 101  < > 82  < > 95  < > 85 100 90  BILITOT 1.10  < > 0.6  < > 0.5  < > 0.5 0.56 0.51  BILIDIR  --   --  0.1  --  0.2  --  0.1  --   --   < > = values in this interval not displayed.  RADIOGRAPHIC STUDIES:I have reviewed his prior CT scan  I have personally reviewed the radiological images as listed and agreed with the findings in the report.  ASSESSMENT & PLAN:  #1 CLL Clinically, he appears to be in remission. His last CT scan 6 months  ago show continued regression of the size of his lymph nodes. His platelet count is improving. Continue conservative management and observation. #2 hypogammaglobulinemia The patient felt better for approximately 3 days every month after each IVIG infusion. I discussed with him the risk of continuing IVIG infusion. Bottom line is I do not feel that the patient needs to maintain on IVIG infusion. The patient will think about it. #3 tobacco abuse I continue to encourage his nicotine cessation effort #4 thrombocytopenia This is related to disease. He is not symptomatic. #5 preventive care I recommend pneumococcal vaccination today. I also discussed with him about the role of vitamin D supplementation.  #6 Venous access I recommend to flush the port to maintain patency. He is platelet count continued to improve in the future and we all made at the need for IVIG infusion, I recommend removal of Infuse-a-Port in the near future. #7 hyperglycemia I recommend he discuss with his primary care provider for further management of hyperglycemia. His blood sugar over month ago is in the diabetic range.  Orders Placed This Encounter  Procedures  . Hemoglobin A1C    Standing Status: Future     Number of Occurrences:      Standing Expiration Date: 06/27/2014  . CBC with Differential    Standing Status: Future     Number of Occurrences:      Standing Expiration Date: 06/26/2014  . Comprehensive  metabolic panel    Standing Status: Future     Number of Occurrences:      Standing Expiration Date: 06/26/2014  . Lactate dehydrogenase    Standing Status: Future     Number of Occurrences:      Standing Expiration Date: 06/26/2014  . IgG, IgA, IgM    Standing Status: Future     Number of Occurrences:      Standing Expiration Date: 06/26/2014    All questions were answered. The patient knows to call the clinic with any problems, questions or concerns. I spent 40 minutes counseling the patient face to  face. The total time spent in the appointment was 55 minutes and more than 50% was on counseling.     [name], MD 06/27/2013 7:53 AM

## 2013-06-27 NOTE — Telephone Encounter (Signed)
Message copied by Cathlean Cower on Thu Jun 27, 2013  8:43 AM ------      Message from: [facility], [place]      Created: Thu Jun 27, 2013  7:51 AM      Regarding: immunoglobulin levels       Please let him know that the results showed that his immunoglobulin levels are improving.      ----- Message -----         From: [facility]         Sent: 06/26/2013   2:21 PM           To: [name], MD                   ------

## 2013-07-02 ENCOUNTER — Encounter: Payer: Self-pay | Admitting: Nurse Practitioner

## 2013-07-02 ENCOUNTER — Ambulatory Visit (INDEPENDENT_AMBULATORY_CARE_PROVIDER_SITE_OTHER): Payer: BC Managed Care – PPO | Admitting: Nurse Practitioner

## 2013-07-02 VITALS — BP 102/56 | HR 58 | Ht 71.5 in | Wt 209.0 lb

## 2013-07-02 DIAGNOSIS — G619 Inflammatory polyneuropathy, unspecified: Secondary | ICD-10-CM

## 2013-07-02 DIAGNOSIS — G609 Hereditary and idiopathic neuropathy, unspecified: Secondary | ICD-10-CM

## 2013-07-02 DIAGNOSIS — G622 Polyneuropathy due to other toxic agents: Secondary | ICD-10-CM

## 2013-07-02 MED ORDER — TOPIRAMATE 25 MG PO TABS: 25.0000 mg | ORAL_TABLET | Freq: Two times a day (BID) | ORAL | Status: DC

## 2013-07-02 MED ORDER — PREGABALIN 75 MG PO CAPS: ORAL_CAPSULE | ORAL | Status: DC

## 2013-07-02 NOTE — Patient Instructions (Signed)
Continue Lyrica at current dose will refill Continue Topamax at current dose Continue compounding cream F/U in 6 months

## 2013-07-02 NOTE — Progress Notes (Signed)
[facility]  PATIENT: Ryan Copeland DOB: [birth year]   REASON FOR VISIT: for polyneuropathy   HISTORY OF PRESENT ILLNESS: Ryan Copeland, 63 year old male returns for followup. He has a history of polyneuropathy which has been long-standing.  He continues to have some pain, numbness and paresthesias in both feet left worse than right. Sensation is worse in great toes. His feet are cold he also has a burning sensation. Gabapentin was not beneficial for his symptoms. He was found to have a low B6 level and an extremely low vitamin B12 level at 121. He has been on vitamin B12 injections which his wife gives monthly. He is currently employed working in [place], [place]. He has had no falls, he is sleeping well. He is currently taking Lyrica 75 mg 3 times a day and two 50 mg at night. He was placed on Transdermal cream last year, with good results. He has failed Cymbalta. He is also on Topamax. He states his walking and balance are unchanged. He has not fallen. He continues to work full time. 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 He returns for reevaluation. He is asking about a TENS unit that is a sandal apparatus. He returns for reevaluation REVIEW OF SYSTEMS: Full 14 system review of systems performed and notable only for those listed, all others are neg:  Constitutional: N/A  Cardiovascular: N/A  Ear/Nose/Throat: N/A  Skin: N/A  Eyes: N/A  Respiratory: N/A  Gastroitestinal: N/A  Hematology/Lymphatic: N/A  Endocrine: N/A Musculoskeletal:N/A  Allergy/Immunology: N/A  Neurological: Burning feet at times  Psychiatric: N/A Sleep : NA   ALLERGIES: Allergies  Allergen Reactions  . Codeine Hives    Pt states he can take a few, more reaction with extended doses.    HOME MEDICATIONS: Outpatient Prescriptions Prior to Visit  Medication Sig Dispense Refill  . acyclovir (ZOVIRAX) 400 MG tablet Take 1 tablet (400 mg total) by mouth daily.  90 tablet  5  . ALPRAZolam (XANAX) 0.25  MG tablet Take 1 po q 6 hours prn for anxiety  90 tablet  1  . aspirin 325 MG tablet Take 325 mg by mouth daily.        . cyanocobalamin (,VITAMIN B-12,) 1000 MCG/ML injection Inject 1 mL (1,000 mcg total) into the muscle every 30 (thirty) days.  3 mL  1  . esomeprazole (NEXIUM) 40 MG capsule Take 1 capsule (40 mg total) by mouth 2 (two) times daily.  180 capsule  3  . metFORMIN (GLUCOPHAGE-XR) 500 MG 24 hr tablet Take 500 mg by mouth 2 (two) times daily.      . metoprolol succinate (TOPROL-XL) 50 MG 24 hr tablet Take 1 tablet (50 mg total) by mouth daily with breakfast. Take with or immediately following a meal.  90 tablet  3  . nicotine (NICODERM CQ) 21 mg/24hr patch Place 1 patch (21 mg total) onto the skin daily.  14 patch  1  . pregabalin (LYRICA) 75 MG capsule 1 cap three times daily and 2 caps at bedtime  450 capsule  1  . rosuvastatin (CRESTOR) 5 MG tablet Take 1 tablet (5 mg total) by mouth daily.  90 tablet  3  . sulfamethoxazole-trimethoprim (BACTRIM DS,SEPTRA DS) 800-160 MG per tablet Take 1 tablet by mouth 3 (three) times a week.  90 tablet  5  . tetrahydrozoline 0.05 % ophthalmic solution Place 1 drop into both eyes as needed. Red eyes      . topiramate (TOPAMAX) 25 MG tablet  TAKE 1 TABLET BY MOUTH TWICE DAILY  180 tablet  3   Facility-Administered Medications Prior to Visit  Medication Dose Route Frequency Provider Last Rate Last Dose  . sodium chloride 0.9 % injection 10 mL  10 mL Intracatheter PRN Deatra Robinson, MD   10 mL at 08/22/12 1721  . sodium chloride 0.9 % injection 10 mL  10 mL Intravenous PRN Deatra Robinson, MD   10 mL at 05/29/13 1323    PAST MEDICAL HISTORY: Past Medical History  Diagnosis Date  . 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 Hyperlipidemia   . Tobacco abuse   . PVD (peripheral vascular disease)   . 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 Diabetes mellitus   . Neuromuscular disorder     peripheral  neuropathy    PAST SURGICAL HISTORY: Past Surgical History  Procedure Laterality Date  . Femoral stents    . Coronary stent placement  May 2011  . Cholecystectomy  2007  . Carpel tunnel release Left [id number]  . Carpel tunnel release  Right 01-1989  . Tarsal tunnel release Bilateral 08-2007    FAMILY HISTORY: Family History  Problem Relation Age of Onset  . Lung cancer Mother 76  . Cancer Mother     lung  . Heart failure Father 4  . Heart disease Father     SOCIAL HISTORY: History   Social History  . Marital Status: Married    Spouse Name: [name]    Number of Children: 1  . Years of Education: College   Occupational History  .      [employer]   Social History Main Topics  . Smoking status: Former Smoker -- 1.00 packs/day for 43 years    Types: Cigarettes    Quit date: 03/27/2013  . Smokeless tobacco: Former Systems developer  . Alcohol Use: No     Comment:  drinks non-alcoholic beer  . Drug Use: No  . Sexual Activity: Yes   Other Topics Concern  . Not on file   Social History Narrative   Patient lives at home with wife.   Caffeine Use: 15 cups of caffeine weekly     PHYSICAL EXAM  Filed Vitals:   07/02/13 0834  BP: 102/56  Pulse: 58  Height: 5' 11.5" (1.816 m)  Weight: 209 lb (94.802 kg)   Body mass index is 28.75 kg/(m^2).  Generalized: Well developed, in no acute distress  Head: normocephalic and atraumatic,. Oropharynx benign  Neck: Supple, no carotid bruits  Cardiac: Regular rate rhythm, no murmur  Musculoskeletal: No deformity   Neurological examination   Mentation: Alert oriented to time, place, history taking. Follows all commands speech and language fluent  Cranial nerve II-XII: Pupils were equal round reactive to light extraocular movements were full, visual field were full on confrontational test. Facial sensation and strength were normal. hearing was intact to finger rubbing bilaterally. Uvula tongue midline. head turning and shoulder  shrug were normal and symmetric.Tongue protrusion into cheek strength was normal. Motor: normal bulk and tone, full strength in the BUE, BLE, fine finger movements normal, no pronator drift. No focal weakness Sensory: Diminished pinprick to the knees bilaterally, decreased vibratory to the toes, position sense is normal   Coordination: finger-nose-finger, heel-to-shin bilaterally, no dysmetria Reflexes: Diminished upper and lower and symmetric, plantar responses were flexor bilaterally. Gait and Station: Rising up from seated position without assistance, normal stance,  moderate stride, good arm swing,  smooth turning, able to perform tiptoe, and heel walking without difficulty. Tandem gait is steady  DIAGNOSTIC DATA (LABS, IMAGING, TESTING) - I reviewed patient records, labs, notes, testing and imaging myself where available.  Lab Results  Component Value Date   WBC 4.1 06/26/2013   HGB 14.4 06/26/2013   HCT 41.0 06/26/2013   MCV 95.1 06/26/2013   PLT 85* 06/26/2013      Component Value Date/Time   NA 143 06/26/2013 1412   NA 138 05/03/2013 1359   K 4.0 06/26/2013 1412   K 4.4 05/03/2013 1359   CL 102 05/03/2013 1359   CL 104 08/03/2012 1229   CO2 23 06/26/2013 1412   CO2 28 05/03/2013 1359   GLUCOSE 119 06/26/2013 1412   GLUCOSE 192* 05/03/2013 1359   GLUCOSE 223* 08/03/2012 1229   BUN 11.9 06/26/2013 1412   BUN 12 05/03/2013 1359   CREATININE 1.0 06/26/2013 1412   CREATININE 0.9 05/03/2013 1359   CALCIUM 9.5 06/26/2013 1412   CALCIUM 9.4 05/03/2013 1359   PROT 6.6 06/26/2013 1412   PROT 7.0 05/03/2013 1359   ALBUMIN 4.0 06/26/2013 1412   ALBUMIN 4.2 05/03/2013 1359   AST 16 06/26/2013 1412   AST 24 05/03/2013 1359   ALT 37 06/26/2013 1412   ALT 46 05/03/2013 1359   ALKPHOS 90 06/26/2013 1412   ALKPHOS 85 05/03/2013 1359   BILITOT 0.51 06/26/2013 1412   BILITOT 0.5 05/03/2013 1359   GFRNONAA 88* 06/01/2012 1145   GFRAA >90 06/01/2012 1145   Lab Results  Component Value Date   CHOL 147 05/03/2013    HDL 37.50* 05/03/2013   LDLCALC 72 05/03/2013   LDLDIRECT 117.6 08/22/2011   TRIG 187.0* 05/03/2013   CHOLHDL 4 05/03/2013     ASSESSMENT AND PLAN  63 y.o. year old male  has a past medical history of MI, acute, non ST segment elevation (06/28/2009); Hyperlipidemia; Tobacco abuse; PVD (peripheral vascular disease); CLL (chronic lymphocytic leukemia) (03/18/2011); Diabetes mellitus; and long-standing history of polyneuropathy here for followup.  Continue Lyrica at current dose will refill Continue Topamax at current dose Continue compounding cream Patient is asking about a TENS sandle unit, he is asked to send me the information , I am not aware of this apparatus. F/U in 6 months Dennie Bible, [facility], [facility], APRN  [facility] [address], [place], [place] [address] [phone]

## 2013-07-24 ENCOUNTER — Ambulatory Visit: Payer: Self-pay | Admitting: Oncology

## 2013-07-24 ENCOUNTER — Other Ambulatory Visit: Payer: Self-pay

## 2013-07-24 ENCOUNTER — Ambulatory Visit: Payer: Self-pay

## 2013-07-26 ENCOUNTER — Other Ambulatory Visit (HOSPITAL_BASED_OUTPATIENT_CLINIC_OR_DEPARTMENT_OTHER): Payer: BC Managed Care – PPO

## 2013-07-26 DIAGNOSIS — C9110 Chronic lymphocytic leukemia of B-cell type not having achieved remission: Secondary | ICD-10-CM

## 2013-07-26 LAB — CBC WITH DIFFERENTIAL/PLATELET
BASO%: 0.6 % (ref 0.0–2.0)
Basophils Absolute: 0 10*3/uL (ref 0.0–0.1)
EOS ABS: 0.1 10*3/uL (ref 0.0–0.5)
EOS%: 0.9 % (ref 0.0–7.0)
HEMATOCRIT: 43.8 % (ref 38.4–49.9)
HGB: 14.8 g/dL (ref 13.0–17.1)
LYMPH%: 3.7 % — AB (ref 14.0–49.0)
MCH: 33.5 pg — ABNORMAL HIGH (ref 27.2–33.4)
MCHC: 33.9 g/dL (ref 32.0–36.0)
MCV: 99.1 fL — AB (ref 79.3–98.0)
MONO#: 0.5 10*3/uL (ref 0.1–0.9)
MONO%: 8.1 % (ref 0.0–14.0)
NEUT#: 5.1 10*3/uL (ref 1.5–6.5)
NEUT%: 86.7 % — AB (ref 39.0–75.0)
PLATELETS: 92 10*3/uL — AB (ref 140–400)
RBC: 4.42 10*6/uL (ref 4.20–5.82)
RDW: 14.5 % (ref 11.0–14.6)
WBC: 5.9 10*3/uL (ref 4.0–10.3)
lymph#: 0.2 10*3/uL — ABNORMAL LOW (ref 0.9–3.3)

## 2013-07-26 LAB — COMPREHENSIVE METABOLIC PANEL (CC13)
ALK PHOS: 91 U/L (ref 40–150)
ALT: 35 U/L (ref 0–55)
ANION GAP: 7 meq/L (ref 3–11)
AST: 16 U/L (ref 5–34)
Albumin: 3.9 g/dL (ref 3.5–5.0)
BILIRUBIN TOTAL: 0.86 mg/dL (ref 0.20–1.20)
BUN: 12.5 mg/dL (ref 7.0–26.0)
CO2: 24 mEq/L (ref 22–29)
CREATININE: 1 mg/dL (ref 0.7–1.3)
Calcium: 9.1 mg/dL (ref 8.4–10.4)
Chloride: 107 mEq/L (ref 98–109)
GLUCOSE: 130 mg/dL (ref 70–140)
Potassium: 4 mEq/L (ref 3.5–5.1)
SODIUM: 138 meq/L (ref 136–145)
TOTAL PROTEIN: 6.3 g/dL — AB (ref 6.4–8.3)

## 2013-07-26 LAB — LACTATE DEHYDROGENASE (CC13): LDH: 165 U/L (ref 125–245)

## 2013-07-27 LAB — IGG, IGA, IGM
IGM, SERUM: 18 mg/dL — AB (ref 41–251)
IgA: 64 mg/dL — ABNORMAL LOW (ref 68–379)
IgG (Immunoglobin G), Serum: 525 mg/dL — ABNORMAL LOW (ref 650–1600)

## 2013-07-27 LAB — HEMOGLOBIN A1C
Hgb A1c MFr Bld: 7.5 % — ABNORMAL HIGH (ref <5.7)
Mean Plasma Glucose: 169 mg/dL — ABNORMAL HIGH (ref <117)

## 2013-08-02 ENCOUNTER — Ambulatory Visit (HOSPITAL_BASED_OUTPATIENT_CLINIC_OR_DEPARTMENT_OTHER): Payer: BC Managed Care – PPO | Admitting: Hematology and Oncology

## 2013-08-02 ENCOUNTER — Encounter: Payer: Self-pay | Admitting: Hematology and Oncology

## 2013-08-02 ENCOUNTER — Telehealth: Payer: Self-pay | Admitting: Hematology and Oncology

## 2013-08-02 VITALS — BP 110/62 | HR 69 | Temp 97.7°F | Resp 18 | Ht 71.5 in | Wt 214.0 lb

## 2013-08-02 DIAGNOSIS — C9110 Chronic lymphocytic leukemia of B-cell type not having achieved remission: Secondary | ICD-10-CM

## 2013-08-02 DIAGNOSIS — F17200 Nicotine dependence, unspecified, uncomplicated: Secondary | ICD-10-CM

## 2013-08-02 DIAGNOSIS — E119 Type 2 diabetes mellitus without complications: Secondary | ICD-10-CM | POA: Insufficient documentation

## 2013-08-02 DIAGNOSIS — D839 Common variable immunodeficiency, unspecified: Secondary | ICD-10-CM

## 2013-08-02 DIAGNOSIS — Z720 Tobacco use: Secondary | ICD-10-CM

## 2013-08-02 DIAGNOSIS — D801 Nonfamilial hypogammaglobulinemia: Secondary | ICD-10-CM

## 2013-08-02 MED ORDER — NICOTINE 21 MG/24HR TD PT24: 21.0000 mg | MEDICATED_PATCH | Freq: Every day | TRANSDERMAL | Status: DC

## 2013-08-02 NOTE — Assessment & Plan Note (Signed)
The patient has achieved complete response clinically. I will continue to see him every 6 months with history, physical examination and blood work and to defer imaging studies unless he had signs and symptoms to suggest disease recurrence.

## 2013-08-02 NOTE — Progress Notes (Signed)
[place] OFFICE PROGRESS NOTE  Patient Care Team: [name], PA-C as PCP - General (Family Medicine)  SUMMARY OF ONCOLOGIC HISTORY: This patient was originally diagnosed with CLL after he was found to have leukocytosis. Imaging study with CT scan show diffuse lymphadenopathy with splenomegaly. Due to worsening thrombocytopenia, he was subsequently treated with combination therapy with fludarabine, Cytoxan and rituximab from May 2014 to October 2014. The patient denies side effects or complications from treatment. He has significant smoking history with suspected COPD. 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 From October 2014 onwards, he was prescribed monthly IVIG infusion.  INTERVAL HISTORY: Please see below for problem oriented charting. He complained of feeling fatigued with some cough. He continued to smoke.  REVIEW OF SYSTEMS:   Constitutional: Denies fevers, chills or abnormal weight loss Eyes: Denies blurriness of vision Cardiovascular: Denies palpitation, chest discomfort or lower extremity swelling Gastrointestinal:  Denies nausea, heartburn or change in bowel habits Skin: Denies abnormal skin rashes Lymphatics: Denies new lymphadenopathy or easy bruising Neurological:Denies numbness, tingling or new weaknesses Behavioral/Psych: Mood is stable, no new changes  All other systems were reviewed with the patient and are negative.  I have reviewed the past medical history, past surgical history, social history and family history with the patient and they are unchanged from previous note.  ALLERGIES:  is allergic to codeine.  MEDICATIONS:  Current Outpatient Prescriptions  Medication Sig Dispense Refill  . acyclovir (ZOVIRAX) 400 MG tablet Take 1 tablet (400 mg total) by mouth daily.  90 tablet  5  . ALPRAZolam (XANAX) 0.25 MG tablet Take 1 po q 6 hours prn for anxiety  90 tablet  1  . aspirin 325 MG  tablet Take 325 mg by mouth daily.        . cyanocobalamin (,VITAMIN B-12,) 1000 MCG/ML injection Inject 1 mL (1,000 mcg total) into the muscle every 30 (thirty) days.  3 mL  1  . esomeprazole (NEXIUM) 40 MG capsule Take 1 capsule (40 mg total) by mouth 2 (two) times daily.  180 capsule  3  . metFORMIN (GLUCOPHAGE-XR) 500 MG 24 hr tablet Take 500 mg by mouth 2 (two) times daily.      . metoprolol succinate (TOPROL-XL) 50 MG 24 hr tablet Take 1 tablet (50 mg total) by mouth daily with breakfast. Take with or immediately following a meal.  90 tablet  3  . nicotine (NICODERM CQ) 21 mg/24hr patch Place 1 patch (21 mg total) onto the skin daily.  14 patch  1  . pregabalin (LYRICA) 75 MG capsule 1 cap three times daily and 2 caps at bedtime  450 capsule  1  . rosuvastatin (CRESTOR) 5 MG tablet Take 1 tablet (5 mg total) by mouth daily.  90 tablet  3  . tetrahydrozoline 0.05 % ophthalmic solution Place 1 drop into both eyes as needed. Red eyes      . topiramate (TOPAMAX) 25 MG tablet Take 1 tablet (25 mg total) by mouth 2 (two) times daily.  180 tablet  3   No current facility-administered medications for this visit.   Facility-Administered Medications Ordered in Other Visits  Medication Dose Route Frequency Provider Last Rate Last Dose  . sodium chloride 0.9 % injection 10 mL  10 mL Intracatheter PRN Deatra Robinson, MD   10 mL at 08/22/12 1721  . sodium chloride 0.9 % injection 10 mL  10 mL Intravenous PRN Deatra Robinson, MD   10  mL at 05/29/13 1323    PHYSICAL EXAMINATION: ECOG PERFORMANCE STATUS: 1 - Symptomatic but completely ambulatory  Filed Vitals:   08/02/13 1307  BP: 110/62  Pulse: 69  Temp: 97.7 F (36.5 C)  Resp: 18   Filed Weights   08/02/13 1307  Weight: 214 lb (97.07 kg)    GENERAL:alert, no distress and comfortable SKIN: skin color, texture, turgor are normal, no rashes or significant lesions EYES: normal, Conjunctiva are pink and non-injected, sclera  clear Musculoskeletal:no cyanosis of digits and no clubbing  NEURO: alert & oriented x 3 with fluent speech, no focal motor/sensory deficits  LABORATORY DATA:  I have reviewed the data as listed    Component Value Date/Time   NA 138 07/26/2013 1210   NA 138 05/03/2013 1359   K 4.0 07/26/2013 1210   K 4.4 05/03/2013 1359   CL 102 05/03/2013 1359   CL 104 08/03/2012 1229   CO2 24 07/26/2013 1210   CO2 28 05/03/2013 1359   GLUCOSE 130 07/26/2013 1210   GLUCOSE 192* 05/03/2013 1359   GLUCOSE 223* 08/03/2012 1229   BUN 12.5 07/26/2013 1210   BUN 12 05/03/2013 1359   CREATININE 1.0 07/26/2013 1210   CREATININE 0.9 05/03/2013 1359   CALCIUM 9.1 07/26/2013 1210   CALCIUM 9.4 05/03/2013 1359   PROT 6.3* 07/26/2013 1210   PROT 7.0 05/03/2013 1359   ALBUMIN 3.9 07/26/2013 1210   ALBUMIN 4.2 05/03/2013 1359   AST 16 07/26/2013 1210   AST 24 05/03/2013 1359   ALT 35 07/26/2013 1210   ALT 46 05/03/2013 1359   ALKPHOS 91 07/26/2013 1210   ALKPHOS 85 05/03/2013 1359   BILITOT 0.86 07/26/2013 1210   BILITOT 0.5 05/03/2013 1359   GFRNONAA 88* 06/01/2012 1145   GFRAA >90 06/01/2012 1145    No results found for this basename: SPEP,  UPEP,   kappa and lambda light chains    Lab Results  Component Value Date   WBC 5.9 07/26/2013   NEUTROABS 5.1 07/26/2013   HGB 14.8 07/26/2013   HCT 43.8 07/26/2013   MCV 99.1* 07/26/2013   PLT 92* 07/26/2013      Chemistry      Component Value Date/Time   NA 138 07/26/2013 1210   NA 138 05/03/2013 1359   K 4.0 07/26/2013 1210   K 4.4 05/03/2013 1359   CL 102 05/03/2013 1359   CL 104 08/03/2012 1229   CO2 24 07/26/2013 1210   CO2 28 05/03/2013 1359   BUN 12.5 07/26/2013 1210   BUN 12 05/03/2013 1359   CREATININE 1.0 07/26/2013 1210   CREATININE 0.9 05/03/2013 1359      Component Value Date/Time   CALCIUM 9.1 07/26/2013 1210   CALCIUM 9.4 05/03/2013 1359   ALKPHOS 91 07/26/2013 1210   ALKPHOS 85 05/03/2013 1359   AST 16 07/26/2013 1210   AST 24 05/03/2013 1359   ALT 35 07/26/2013 1210    ALT 46 05/03/2013 1359   BILITOT 0.86 07/26/2013 1210   BILITOT 0.5 05/03/2013 1359      ASSESSMENT & PLAN:  CLL (chronic lymphocytic leukemia) The patient has achieved complete response clinically. I will continue to see him every 6 months with history, physical examination and blood work and to defer imaging studies unless he had signs and symptoms to suggest disease recurrence.  Tobacco abuse I spent some time counseling the patient the importance of tobacco cessation. he is currently attempting to quit on his own I refilled his prescription  nicotine patch per patient request.  Hypogammaglobulinemia, acquired He has significant signs and symptoms of upper respiratory tract congestion since continuation of IVIG. The patient requested to resume therapy which I think is reasonable, to prevent risk of infection. The risk and benefits and side effects of IVIG infusion has been discussed with patient and he agreed to proceed.  Type II or unspecified type diabetes mellitus without mention of complication, not stated as uncontrolled Hemoglobin A1c is high. I recommend dietary adjustment, weight loss and exercise. The patient has been started on metformin and I recommend rechecking hemoglobin A1c in 6 months as directed by his primary care provider.    Orders Placed This Encounter  Procedures  . CBC with Differential    Standing Status: Standing     Number of Occurrences: 22     Standing Expiration Date: 08/03/2014   All questions were answered. The patient knows to call the clinic with any problems, questions or concerns. No barriers to learning was detected. I spent 25 minutes counseling the patient face to face. The total time spent in the appointment was 30 minutes and more than 50% was on counseling and review of test results     [facility], [place], MD 08/02/2013 1:58 PM

## 2013-08-02 NOTE — Assessment & Plan Note (Signed)
Hemoglobin A1c is high. I recommend dietary adjustment, weight loss and exercise. The patient has been started on metformin and I recommend rechecking hemoglobin A1c in 6 months as directed by his primary care provider.

## 2013-08-02 NOTE — Assessment & Plan Note (Signed)
I spent some time counseling the patient the importance of tobacco cessation. he is currently attempting to quit on his own I refilled his prescription nicotine patch per patient request.

## 2013-08-02 NOTE — Telephone Encounter (Signed)
gv adn printed appt sched and avs for pt fro July thru NOV....sed added tx.

## 2013-08-02 NOTE — Assessment & Plan Note (Addendum)
He has significant signs and symptoms of upper respiratory tract congestion since continuation of IVIG. The patient requested to resume therapy which I think is reasonable, to prevent risk of infection. The risk and benefits and side effects of IVIG infusion has been discussed with patient and he agreed to proceed.

## 2013-08-09 ENCOUNTER — Ambulatory Visit (HOSPITAL_BASED_OUTPATIENT_CLINIC_OR_DEPARTMENT_OTHER): Payer: BC Managed Care – PPO

## 2013-08-09 VITALS — BP 106/67 | HR 55 | Temp 98.2°F | Resp 18

## 2013-08-09 DIAGNOSIS — C9110 Chronic lymphocytic leukemia of B-cell type not having achieved remission: Secondary | ICD-10-CM

## 2013-08-09 DIAGNOSIS — D839 Common variable immunodeficiency, unspecified: Secondary | ICD-10-CM

## 2013-08-09 MED ORDER — ACETAMINOPHEN 325 MG PO TABS
ORAL_TABLET | ORAL | Status: AC
  Filled 2013-08-09: qty 2

## 2013-08-09 MED ORDER — SODIUM CHLORIDE 0.9 % IJ SOLN
10.0000 mL | INTRAMUSCULAR | Status: DC | PRN
  Administered 2013-08-09: 10 mL via INTRAVENOUS
  Filled 2013-08-09: qty 10

## 2013-08-09 MED ORDER — DIPHENHYDRAMINE HCL 25 MG PO TABS
25.0000 mg | ORAL_TABLET | Freq: Once | ORAL | Status: AC
  Administered 2013-08-09: 25 mg via ORAL
  Filled 2013-08-09: qty 1

## 2013-08-09 MED ORDER — IMMUNE GLOBULIN (HUMAN) 10 GM/100ML IV SOLN: 0.5000 g/kg | Freq: Once | INTRAVENOUS | Status: DC

## 2013-08-09 MED ORDER — DIPHENHYDRAMINE HCL 25 MG PO CAPS
ORAL_CAPSULE | ORAL | Status: AC
  Filled 2013-08-09: qty 1

## 2013-08-09 MED ORDER — ACETAMINOPHEN 325 MG PO TABS
650.0000 mg | ORAL_TABLET | Freq: Four times a day (QID) | ORAL | Status: DC | PRN
  Administered 2013-08-09: 650 mg via ORAL

## 2013-08-09 MED ORDER — IMMUNE GLOBULIN (HUMAN) 10 GM/100ML IV SOLN
0.5000 g/kg | Freq: Once | INTRAVENOUS | Status: AC
  Administered 2013-08-09: 50 g via INTRAVENOUS
  Filled 2013-08-09: qty 500

## 2013-08-09 MED ORDER — SODIUM CHLORIDE 0.9 % IV SOLN
Freq: Once | INTRAVENOUS | Status: AC
  Administered 2013-08-09: 11:00:00 via INTRAVENOUS

## 2013-08-09 MED ORDER — DEXTROSE 5 % IV SOLN
Freq: Once | INTRAVENOUS | Status: AC
  Administered 2013-08-09: 12:00:00 via INTRAVENOUS

## 2013-08-09 MED ORDER — HEPARIN SOD (PORK) LOCK FLUSH 100 UNIT/ML IV SOLN
500.0000 [IU] | Freq: Once | INTRAVENOUS | Status: AC
  Administered 2013-08-09: 500 [IU] via INTRAVENOUS
  Filled 2013-08-09: qty 5

## 2013-08-09 NOTE — Patient Instructions (Signed)
Immune Globulin Injection What is this medicine? IMMUNE GLOBULIN (im MUNE GLOB yoo lin) helps to prevent or reduce the severity of certain infections in patients who are at risk. This medicine is collected from the pooled blood of many donors. It is used to treat immune system problems, thrombocytopenia, and Kawasaki syndrome. This medicine may be used for other purposes; ask your health care Doratha Mcswain or pharmacist if you have questions. COMMON BRAND NAME(S): Baygam, BIVIGAM, Carimune, Carimune NF, Flebogamma, Flebogamma DIF, GamaSTAN S/D, Gamimune N, Gammagard S/D, Gammaked, Gammaplex, Gammar-P IV, Gamunex, Gamunex-C, Hizentra, Iveegam, Iveegam EN, Octagam, Panglobulin, Panglobulin NF, Polygam S/D, Privigen, Sandoglobulin, Venoglobulin-S, Vigam, Vivaglobulin What should I tell my health care Evoleht Hovatter before I take this medicine? They need to know if you have any of these conditions: - diabetes - extremely low or no immune antibodies in the blood - heart disease - history of blood clots - hyperprolinemia - infection in the blood, sepsis - kidney disease - taking medicine that may change kidney function - ask your health care Jaxn Chiquito about your medicine - an unusual or allergic reaction to human immune globulin, albumin, maltose, sucrose, polysorbate 80, other medicines, foods, dyes, or preservatives - pregnant or trying to get pregnant - breast-feeding How should I use this medicine? This medicine is for injection into a muscle or infusion into a vein or skin. It is usually given by a health care professional in a hospital or clinic setting. In rare cases, some brands of this medicine might be given at home. You will be taught how to give this medicine. Use exactly as directed. Take your medicine at regular intervals. Do not take your medicine more often than directed. Talk to your pediatrician regarding the use of this medicine in children. Special care may be needed. Overdosage:  If you think you have taken too much of this medicine contact a poison control center or emergency room at once. NOTE: This medicine is only for you. Do not share this medicine with others. What if I miss a dose? It is important not to miss your dose. Call your doctor or health care professional if you are unable to keep an appointment. If you give yourself the medicine and you miss a dose, take it as soon as you can. If it is almost time for your next dose, take only that dose. Do not take double or extra doses. What may interact with this medicine? -aspirin and aspirin-like medicines -cisplatin -cyclosporine -medicines for infection like acyclovir, adefovir, amphotericin B, bacitracin, cidofovir, foscarnet, ganciclovir, gentamicin, pentamidine, vancomycin -NSAIDS, medicines for pain and inflammation, like ibuprofen or naproxen -pamidronate -vaccines -zoledronic acid This list may not describe all possible interactions. Give your health care Adlynn Lowenstein a list of all the medicines, herbs, non-prescription drugs, or dietary supplements you use. Also tell them if you smoke, drink alcohol, or use illegal drugs. Some items may interact with your medicine. What should I watch for while using this medicine? Your condition will be monitored carefully while you are receiving this medicine. This medicine is made from pooled blood donations of many different people. It may be possible to pass an infection in this medicine. However, the donors are screened for infections and all products are tested for HIV and hepatitis. The medicine is treated to kill most or all bacteria and viruses. Talk to your doctor about the risks and benefits of this medicine. Do not have vaccinations for at least 14 days before, or until at least 3 months after receiving this   medicine. What side effects may I notice from receiving this medicine? Side effects that you should report to your doctor or health care professional as soon as  possible: -allergic reactions like skin rash, itching or hives, swelling of the face, lips, or tongue -breathing problems -chest pain or tightness -fever, chills -headache with nausea, vomiting -neck pain or difficulty moving neck -pain when moving eyes -pain, swelling, warmth in the leg -problems with balance, talking, walking -sudden weight gain -swelling of the ankles, feet, hands -trouble passing urine or change in the amount of urine Side effects that usually do not require medical attention (report to your doctor or health care professional if they continue or are bothersome): -dizzy, drowsy -flushing -increased sweating -leg cramps -muscle aches and pains -pain at site where injected This list may not describe all possible side effects. Call your doctor for medical advice about side effects. You may report side effects to FDA at 1-800-FDA-1088. Where should I keep my medicine? Keep out of the reach of children. This drug is usually given in a hospital or clinic and will not be stored at home. In rare cases, some brands of this medicine may be given at home. If you are using this medicine at home, you will be instructed on how to store this medicine. Throw away any unused medicine after the expiration date on the label. NOTE: This sheet is a summary. It may not cover all possible information. If you have questions about this medicine, talk to your doctor, pharmacist, or health care Karalee Hauter.  2015, [employer]vier/Gold Standard. (2008-04-23 11:44:49)  

## 2013-08-21 ENCOUNTER — Ambulatory Visit: Payer: Self-pay

## 2013-08-21 ENCOUNTER — Other Ambulatory Visit: Payer: Self-pay

## 2013-08-21 ENCOUNTER — Ambulatory Visit: Payer: Self-pay | Admitting: Oncology

## 2013-09-06 ENCOUNTER — Ambulatory Visit (HOSPITAL_BASED_OUTPATIENT_CLINIC_OR_DEPARTMENT_OTHER): Payer: BC Managed Care – PPO

## 2013-09-06 ENCOUNTER — Other Ambulatory Visit (HOSPITAL_BASED_OUTPATIENT_CLINIC_OR_DEPARTMENT_OTHER): Payer: BC Managed Care – PPO

## 2013-09-06 VITALS — BP 119/56 | HR 55 | Temp 97.8°F | Resp 16

## 2013-09-06 DIAGNOSIS — D839 Common variable immunodeficiency, unspecified: Secondary | ICD-10-CM

## 2013-09-06 DIAGNOSIS — C9110 Chronic lymphocytic leukemia of B-cell type not having achieved remission: Secondary | ICD-10-CM

## 2013-09-06 LAB — CBC WITH DIFFERENTIAL/PLATELET
BASO%: 0.5 % (ref 0.0–2.0)
BASOS ABS: 0 10*3/uL (ref 0.0–0.1)
EOS ABS: 0 10*3/uL (ref 0.0–0.5)
EOS%: 0.5 % (ref 0.0–7.0)
HEMATOCRIT: 44.2 % (ref 38.4–49.9)
HEMOGLOBIN: 14.9 g/dL (ref 13.0–17.1)
LYMPH#: 0.2 10*3/uL — AB (ref 0.9–3.3)
LYMPH%: 3.4 % — ABNORMAL LOW (ref 14.0–49.0)
MCH: 33.4 pg (ref 27.2–33.4)
MCHC: 33.7 g/dL (ref 32.0–36.0)
MCV: 99.3 fL — ABNORMAL HIGH (ref 79.3–98.0)
MONO#: 0.5 10*3/uL (ref 0.1–0.9)
MONO%: 8.4 % (ref 0.0–14.0)
NEUT#: 5.1 10*3/uL (ref 1.5–6.5)
NEUT%: 87.2 % — ABNORMAL HIGH (ref 39.0–75.0)
Platelets: 89 10*3/uL — ABNORMAL LOW (ref 140–400)
RBC: 4.45 10*6/uL (ref 4.20–5.82)
RDW: 14.5 % (ref 11.0–14.6)
WBC: 5.8 10*3/uL (ref 4.0–10.3)

## 2013-09-06 MED ORDER — ACETAMINOPHEN 325 MG PO TABS
650.0000 mg | ORAL_TABLET | Freq: Four times a day (QID) | ORAL | Status: DC | PRN
  Administered 2013-09-06: 650 mg via ORAL

## 2013-09-06 MED ORDER — HEPARIN SOD (PORK) LOCK FLUSH 100 UNIT/ML IV SOLN
500.0000 [IU] | Freq: Once | INTRAVENOUS | Status: AC
  Administered 2013-09-06: 500 [IU] via INTRAVENOUS
  Filled 2013-09-06: qty 5

## 2013-09-06 MED ORDER — IMMUNE GLOBULIN (HUMAN) 10 GM/100ML IV SOLN
50.0000 g | Freq: Once | INTRAVENOUS | Status: AC
  Administered 2013-09-06: 50 g via INTRAVENOUS
  Filled 2013-09-06: qty 500

## 2013-09-06 MED ORDER — DIPHENHYDRAMINE HCL 25 MG PO TABS
25.0000 mg | ORAL_TABLET | Freq: Once | ORAL | Status: AC
  Administered 2013-09-06: 25 mg via ORAL
  Filled 2013-09-06: qty 1

## 2013-09-06 MED ORDER — SODIUM CHLORIDE 0.9 % IJ SOLN
10.0000 mL | INTRAMUSCULAR | Status: DC | PRN
  Administered 2013-09-06: 10 mL via INTRAVENOUS
  Filled 2013-09-06: qty 10

## 2013-09-06 MED ORDER — ACETAMINOPHEN 325 MG PO TABS
ORAL_TABLET | ORAL | Status: AC
  Filled 2013-09-06: qty 2

## 2013-09-06 MED ORDER — DIPHENHYDRAMINE HCL 25 MG PO CAPS
ORAL_CAPSULE | ORAL | Status: AC
  Filled 2013-09-06: qty 1

## 2013-09-06 NOTE — Patient Instructions (Signed)

## 2013-10-04 ENCOUNTER — Other Ambulatory Visit: Payer: Self-pay | Admitting: Hematology and Oncology

## 2013-10-04 ENCOUNTER — Other Ambulatory Visit (HOSPITAL_BASED_OUTPATIENT_CLINIC_OR_DEPARTMENT_OTHER): Payer: BC Managed Care – PPO

## 2013-10-04 ENCOUNTER — Ambulatory Visit (HOSPITAL_BASED_OUTPATIENT_CLINIC_OR_DEPARTMENT_OTHER): Payer: BC Managed Care – PPO

## 2013-10-04 VITALS — BP 117/55 | HR 55 | Temp 97.8°F | Resp 19

## 2013-10-04 DIAGNOSIS — D839 Common variable immunodeficiency, unspecified: Secondary | ICD-10-CM

## 2013-10-04 DIAGNOSIS — C9110 Chronic lymphocytic leukemia of B-cell type not having achieved remission: Secondary | ICD-10-CM

## 2013-10-04 LAB — CBC WITH DIFFERENTIAL/PLATELET
BASO%: 0.2 % (ref 0.0–2.0)
BASOS ABS: 0 10*3/uL (ref 0.0–0.1)
EOS%: 0.3 % (ref 0.0–7.0)
Eosinophils Absolute: 0 10*3/uL (ref 0.0–0.5)
HCT: 43.9 % (ref 38.4–49.9)
HEMOGLOBIN: 15.9 g/dL (ref 13.0–17.1)
LYMPH%: 4.3 % — ABNORMAL LOW (ref 14.0–49.0)
MCH: 34.2 pg — AB (ref 27.2–33.4)
MCHC: 36.2 g/dL — ABNORMAL HIGH (ref 32.0–36.0)
MCV: 94.4 fL (ref 79.3–98.0)
MONO#: 0.6 10*3/uL (ref 0.1–0.9)
MONO%: 8.7 % (ref 0.0–14.0)
NEUT%: 86.5 % — ABNORMAL HIGH (ref 39.0–75.0)
NEUTROS ABS: 5.7 10*3/uL (ref 1.5–6.5)
Platelets: 74 10*3/uL — ABNORMAL LOW (ref 140–400)
RBC: 4.65 10*6/uL (ref 4.20–5.82)
RDW: 14.3 % (ref 11.0–14.6)
WBC: 6.6 10*3/uL (ref 4.0–10.3)
lymph#: 0.3 10*3/uL — ABNORMAL LOW (ref 0.9–3.3)

## 2013-10-04 MED ORDER — ACETAMINOPHEN 325 MG PO TABS
ORAL_TABLET | ORAL | Status: AC
  Filled 2013-10-04: qty 2

## 2013-10-04 MED ORDER — DIPHENHYDRAMINE HCL 25 MG PO TABS
25.0000 mg | ORAL_TABLET | Freq: Once | ORAL | Status: AC
  Administered 2013-10-04: 25 mg via ORAL
  Filled 2013-10-04: qty 1

## 2013-10-04 MED ORDER — SODIUM CHLORIDE 0.9 % IJ SOLN
10.0000 mL | INTRAMUSCULAR | Status: DC | PRN
  Administered 2013-10-04: 10 mL via INTRAVENOUS
  Filled 2013-10-04: qty 10

## 2013-10-04 MED ORDER — HEPARIN SOD (PORK) LOCK FLUSH 100 UNIT/ML IV SOLN
500.0000 [IU] | Freq: Once | INTRAVENOUS | Status: AC
  Administered 2013-10-04: 500 [IU] via INTRAVENOUS
  Filled 2013-10-04: qty 5

## 2013-10-04 MED ORDER — SODIUM CHLORIDE 0.9 % IV SOLN
Freq: Once | INTRAVENOUS | Status: AC
  Administered 2013-10-04: 12:00:00 via INTRAVENOUS

## 2013-10-04 MED ORDER — IMMUNE GLOBULIN (HUMAN) 10 GM/100ML IV SOLN
50.0000 g | Freq: Once | INTRAVENOUS | Status: AC
  Administered 2013-10-04: 50 g via INTRAVENOUS
  Filled 2013-10-04: qty 500

## 2013-10-04 MED ORDER — DIPHENHYDRAMINE HCL 25 MG PO CAPS
ORAL_CAPSULE | ORAL | Status: AC
  Filled 2013-10-04: qty 1

## 2013-10-04 MED ORDER — ACETAMINOPHEN 325 MG PO TABS
650.0000 mg | ORAL_TABLET | Freq: Four times a day (QID) | ORAL | Status: DC | PRN
  Administered 2013-10-04: 650 mg via ORAL

## 2013-10-04 NOTE — Patient Instructions (Signed)

## 2013-11-01 ENCOUNTER — Other Ambulatory Visit (HOSPITAL_BASED_OUTPATIENT_CLINIC_OR_DEPARTMENT_OTHER): Payer: BC Managed Care – PPO

## 2013-11-01 ENCOUNTER — Ambulatory Visit (HOSPITAL_BASED_OUTPATIENT_CLINIC_OR_DEPARTMENT_OTHER): Payer: BC Managed Care – PPO

## 2013-11-01 ENCOUNTER — Other Ambulatory Visit: Payer: Self-pay | Admitting: Hematology and Oncology

## 2013-11-01 VITALS — BP 122/65 | HR 55 | Temp 98.5°F | Resp 18

## 2013-11-01 DIAGNOSIS — D839 Common variable immunodeficiency, unspecified: Secondary | ICD-10-CM

## 2013-11-01 DIAGNOSIS — C9110 Chronic lymphocytic leukemia of B-cell type not having achieved remission: Secondary | ICD-10-CM

## 2013-11-01 LAB — CBC WITH DIFFERENTIAL/PLATELET
BASO%: 0.4 % (ref 0.0–2.0)
Basophils Absolute: 0 10*3/uL (ref 0.0–0.1)
EOS%: 0.5 % (ref 0.0–7.0)
Eosinophils Absolute: 0 10*3/uL (ref 0.0–0.5)
HCT: 43.5 % (ref 38.4–49.9)
HGB: 15.7 g/dL (ref 13.0–17.1)
LYMPH%: 4.3 % — AB (ref 14.0–49.0)
MCH: 33.9 pg — ABNORMAL HIGH (ref 27.2–33.4)
MCHC: 36.1 g/dL — ABNORMAL HIGH (ref 32.0–36.0)
MCV: 94 fL (ref 79.3–98.0)
MONO#: 0.6 10*3/uL (ref 0.1–0.9)
MONO%: 10.6 % (ref 0.0–14.0)
NEUT#: 4.7 10*3/uL (ref 1.5–6.5)
NEUT%: 84.2 % — ABNORMAL HIGH (ref 39.0–75.0)
Platelets: 87 10*3/uL — ABNORMAL LOW (ref 140–400)
RBC: 4.63 10*6/uL (ref 4.20–5.82)
RDW: 14.5 % (ref 11.0–14.6)
WBC: 5.6 10*3/uL (ref 4.0–10.3)
lymph#: 0.2 10*3/uL — ABNORMAL LOW (ref 0.9–3.3)

## 2013-11-01 MED ORDER — IMMUNE GLOBULIN (HUMAN) 10 GM/100ML IV SOLN
50.0000 g | Freq: Once | INTRAVENOUS | Status: AC
  Administered 2013-11-01: 50 g via INTRAVENOUS
  Filled 2013-11-01: qty 500

## 2013-11-01 MED ORDER — ACETAMINOPHEN 325 MG PO TABS
650.0000 mg | ORAL_TABLET | Freq: Four times a day (QID) | ORAL | Status: DC | PRN
  Administered 2013-11-01: 650 mg via ORAL

## 2013-11-01 MED ORDER — HEPARIN SOD (PORK) LOCK FLUSH 100 UNIT/ML IV SOLN
500.0000 [IU] | Freq: Once | INTRAVENOUS | Status: AC
  Administered 2013-11-01: 500 [IU] via INTRAVENOUS
  Filled 2013-11-01: qty 5

## 2013-11-01 MED ORDER — DIPHENHYDRAMINE HCL 25 MG PO TABS
25.0000 mg | ORAL_TABLET | Freq: Once | ORAL | Status: AC
  Administered 2013-11-01: 25 mg via ORAL
  Filled 2013-11-01: qty 1

## 2013-11-01 MED ORDER — SODIUM CHLORIDE 0.9 % IJ SOLN
10.0000 mL | INTRAMUSCULAR | Status: DC | PRN
  Administered 2013-11-01: 10 mL via INTRAVENOUS
  Filled 2013-11-01: qty 10

## 2013-11-01 MED ORDER — SODIUM CHLORIDE 0.9 % IV SOLN
Freq: Once | INTRAVENOUS | Status: AC
Start: 2013-11-01 — End: 2013-11-01
  Administered 2013-11-01: 12:00:00 via INTRAVENOUS

## 2013-11-01 MED ORDER — ACETAMINOPHEN 325 MG PO TABS
ORAL_TABLET | ORAL | Status: AC
  Filled 2013-11-01: qty 2

## 2013-11-01 MED ORDER — DIPHENHYDRAMINE HCL 25 MG PO CAPS
ORAL_CAPSULE | ORAL | Status: AC
  Filled 2013-11-01: qty 1

## 2013-11-01 NOTE — Patient Instructions (Signed)

## 2013-11-05 ENCOUNTER — Other Ambulatory Visit: Payer: Self-pay | Admitting: Orthopedic Surgery

## 2013-11-05 DIAGNOSIS — M7710 Lateral epicondylitis, unspecified elbow: Secondary | ICD-10-CM

## 2013-11-08 ENCOUNTER — Ambulatory Visit
Admission: RE | Admit: 2013-11-08 | Discharge: 2013-11-08 | Disposition: A | Discharge status: Home / Self Care | Payer: BC Managed Care – PPO | Source: Ambulatory Visit | Attending: Orthopedic Surgery | Admitting: Orthopedic Surgery

## 2013-11-08 DIAGNOSIS — M7710 Lateral epicondylitis, unspecified elbow: Secondary | ICD-10-CM

## 2013-11-15 ENCOUNTER — Other Ambulatory Visit: Payer: Self-pay | Admitting: *Deleted

## 2013-11-15 DIAGNOSIS — F17200 Nicotine dependence, unspecified, uncomplicated: Secondary | ICD-10-CM

## 2013-11-15 MED ORDER — NICOTINE 21 MG/24HR TD PT24: MEDICATED_PATCH | TRANSDERMAL | Status: DC

## 2013-11-29 ENCOUNTER — Other Ambulatory Visit (HOSPITAL_BASED_OUTPATIENT_CLINIC_OR_DEPARTMENT_OTHER): Payer: BC Managed Care – PPO

## 2013-11-29 ENCOUNTER — Ambulatory Visit (HOSPITAL_BASED_OUTPATIENT_CLINIC_OR_DEPARTMENT_OTHER): Payer: BC Managed Care – PPO

## 2013-11-29 VITALS — BP 131/61 | HR 69 | Temp 98.0°F | Resp 18

## 2013-11-29 DIAGNOSIS — D839 Common variable immunodeficiency, unspecified: Secondary | ICD-10-CM

## 2013-11-29 DIAGNOSIS — C9110 Chronic lymphocytic leukemia of B-cell type not having achieved remission: Secondary | ICD-10-CM

## 2013-11-29 LAB — CBC WITH DIFFERENTIAL/PLATELET
BASO%: 0.8 % (ref 0.0–2.0)
Basophils Absolute: 0.1 10*3/uL (ref 0.0–0.1)
EOS ABS: 0 10*3/uL (ref 0.0–0.5)
EOS%: 0.3 % (ref 0.0–7.0)
HEMATOCRIT: 42.4 % (ref 38.4–49.9)
HGB: 15.4 g/dL (ref 13.0–17.1)
LYMPH%: 8.1 % — AB (ref 14.0–49.0)
MCH: 33.8 pg — ABNORMAL HIGH (ref 27.2–33.4)
MCHC: 36.3 g/dL — ABNORMAL HIGH (ref 32.0–36.0)
MCV: 93.2 fL (ref 79.3–98.0)
MONO#: 0.4 10*3/uL (ref 0.1–0.9)
MONO%: 7.3 % (ref 0.0–14.0)
NEUT%: 83.5 % — AB (ref 39.0–75.0)
NEUTROS ABS: 5.1 10*3/uL (ref 1.5–6.5)
NRBC: 0 % (ref 0–0)
PLATELETS: 85 10*3/uL — AB (ref 140–400)
RBC: 4.55 10*6/uL (ref 4.20–5.82)
RDW: 14.3 % (ref 11.0–14.6)
WBC: 6.1 10*3/uL (ref 4.0–10.3)
lymph#: 0.5 10*3/uL — ABNORMAL LOW (ref 0.9–3.3)

## 2013-11-29 MED ORDER — DIPHENHYDRAMINE HCL 25 MG PO TABS
25.0000 mg | ORAL_TABLET | Freq: Once | ORAL | Status: AC
  Administered 2013-11-29: 25 mg via ORAL
  Filled 2013-11-29: qty 1

## 2013-11-29 MED ORDER — SODIUM CHLORIDE 0.9 % IJ SOLN
10.0000 mL | INTRAMUSCULAR | Status: DC | PRN
  Administered 2013-11-29: 10 mL via INTRAVENOUS
  Filled 2013-11-29: qty 10

## 2013-11-29 MED ORDER — ACETAMINOPHEN 325 MG PO TABS
650.0000 mg | ORAL_TABLET | Freq: Four times a day (QID) | ORAL | Status: DC | PRN
  Administered 2013-11-29: 650 mg via ORAL

## 2013-11-29 MED ORDER — ACETAMINOPHEN 325 MG PO TABS
ORAL_TABLET | ORAL | Status: AC
  Filled 2013-11-29: qty 2

## 2013-11-29 MED ORDER — DIPHENHYDRAMINE HCL 25 MG PO CAPS
ORAL_CAPSULE | ORAL | Status: AC
  Filled 2013-11-29: qty 1

## 2013-11-29 MED ORDER — IMMUNE GLOBULIN (HUMAN) 10 GM/100ML IV SOLN
50.0000 g | Freq: Once | INTRAVENOUS | Status: AC
  Administered 2013-11-29: 50 g via INTRAVENOUS
  Filled 2013-11-29: qty 500

## 2013-11-29 MED ORDER — HEPARIN SOD (PORK) LOCK FLUSH 100 UNIT/ML IV SOLN
500.0000 [IU] | Freq: Once | INTRAVENOUS | Status: AC
  Administered 2013-11-29: 500 [IU] via INTRAVENOUS
  Filled 2013-11-29: qty 5

## 2013-11-29 NOTE — Patient Instructions (Signed)

## 2013-12-09 ENCOUNTER — Other Ambulatory Visit: Payer: Self-pay | Admitting: *Deleted

## 2013-12-09 ENCOUNTER — Other Ambulatory Visit: Payer: Self-pay

## 2013-12-09 DIAGNOSIS — Z48812 Encounter for surgical aftercare following surgery on the circulatory system: Secondary | ICD-10-CM

## 2013-12-09 DIAGNOSIS — I739 Peripheral vascular disease, unspecified: Secondary | ICD-10-CM

## 2013-12-09 MED ORDER — NITROGLYCERIN 0.4 MG SL SUBL: 0.4000 mg | SUBLINGUAL_TABLET | SUBLINGUAL | Status: DC | PRN

## 2013-12-16 ENCOUNTER — Other Ambulatory Visit (HOSPITAL_COMMUNITY): Payer: Self-pay

## 2013-12-16 ENCOUNTER — Encounter (HOSPITAL_COMMUNITY): Payer: Self-pay

## 2013-12-16 ENCOUNTER — Ambulatory Visit: Payer: Self-pay | Admitting: Surgery

## 2013-12-19 ENCOUNTER — Other Ambulatory Visit: Payer: Self-pay | Admitting: *Deleted

## 2013-12-19 DIAGNOSIS — F17200 Nicotine dependence, unspecified, uncomplicated: Secondary | ICD-10-CM

## 2013-12-19 MED ORDER — NICOTINE 21 MG/24HR TD PT24: MEDICATED_PATCH | TRANSDERMAL | Status: DC

## 2013-12-20 ENCOUNTER — Encounter: Payer: Self-pay | Admitting: Surgery

## 2013-12-23 ENCOUNTER — Ambulatory Visit (HOSPITAL_COMMUNITY)
Admission: RE | Admit: 2013-12-23 | Discharge: 2013-12-23 | Disposition: A | Discharge status: Home / Self Care | Payer: BC Managed Care – PPO | Source: Ambulatory Visit | Attending: Surgery | Admitting: Surgery

## 2013-12-23 ENCOUNTER — Encounter: Payer: Self-pay | Admitting: Surgery

## 2013-12-23 ENCOUNTER — Ambulatory Visit (INDEPENDENT_AMBULATORY_CARE_PROVIDER_SITE_OTHER)
Admission: RE | Admit: 2013-12-23 | Discharge: 2013-12-23 | Disposition: A | Discharge status: Home / Self Care | Payer: BC Managed Care – PPO | Source: Ambulatory Visit | Attending: Surgery | Admitting: Surgery

## 2013-12-23 ENCOUNTER — Ambulatory Visit (INDEPENDENT_AMBULATORY_CARE_PROVIDER_SITE_OTHER): Payer: BC Managed Care – PPO | Admitting: Surgery

## 2013-12-23 VITALS — BP 120/69 | HR 55 | Resp 18 | Ht 71.0 in | Wt 214.3 lb

## 2013-12-23 DIAGNOSIS — I739 Peripheral vascular disease, unspecified: Secondary | ICD-10-CM | POA: Insufficient documentation

## 2013-12-23 DIAGNOSIS — H93A3 Pulsatile tinnitus, bilateral: Secondary | ICD-10-CM

## 2013-12-23 DIAGNOSIS — Z720 Tobacco use: Secondary | ICD-10-CM | POA: Insufficient documentation

## 2013-12-23 DIAGNOSIS — H9313 Tinnitus, bilateral: Secondary | ICD-10-CM

## 2013-12-23 DIAGNOSIS — Z48812 Encounter for surgical aftercare following surgery on the circulatory system: Secondary | ICD-10-CM | POA: Insufficient documentation

## 2013-12-23 DIAGNOSIS — I6523 Occlusion and stenosis of bilateral carotid arteries: Secondary | ICD-10-CM

## 2013-12-23 NOTE — Progress Notes (Signed)
Patient name: Ryan Copeland MRN: [id number] DOB: [birth year] Sex: male     Chief Complaint  Patient presents with  . Follow-up    1 year FU ABIs and bilateral LE arterial duplex    . PVD    HISTORY OF PRESENT ILLNESS: The patient comes in today for followup. 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 He is a former patient of Dr. Amedeo Plenty. Dr. Amedeo Plenty did this last intervention and in June of 2010. He used a 10 x 40 Powerflex balloon.  The patient reports no new symptoms.  He did have buttock pain prior to the stent, but has not had any additional problems.  The patient suffers from CLL.  He has not had any changes in his symptoms or disease.  I have also evaluated him for pulsatile tinnitus with a CT scan as well as referral to ENT.  He still has these problems but no identifiable cause has been found.  From a neurovascular standpoint, he denies any symptoms such as numbness or weakness in either extremity, slurred speech, or amaurosis fugax.  He continues to take a statin for hypercholesterolemia.  Past Medical History  Diagnosis Date  . 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 Hyperlipidemia   . Tobacco abuse   . PVD (peripheral vascular disease)   . 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 Diabetes mellitus   . Neuromuscular disorder     peripheral neuropathy  . CAD (coronary artery disease)     Past Surgical History  Procedure Laterality Date  . Femoral stents    . Coronary stent placement  May 2011  . Cholecystectomy  2007  . Carpel tunnel release Left [id number]  . Carpel tunnel release  Right 01-1989  . Tarsal tunnel release Bilateral 08-2007  . Left cai stent/pta and popliteal artery/tibial thrombectomy       History   Social History  . Marital Status: Married    Spouse Name: [name]    Number of Children: 1  . Years of Education: College   Occupational  History  .      [employer]   Social History Main Topics  . Smoking status: Current Every Day Smoker -- 1.00 packs/day for 43 years    Types: Cigarettes  . Smokeless tobacco: Former Systems developer  . Alcohol Use: No     Comment:  drinks non-alcoholic beer  . Drug Use: No  . Sexual Activity: Yes   Other Topics Concern  . Not on file   Social History Narrative   Patient lives at home with wife.   Caffeine Use: 15 cups of caffeine weekly    Family History  Problem Relation Age of Onset  . Lung cancer Mother 72  . Cancer Mother     lung  . Heart failure Father 53  . Heart disease Father     Allergies as of 12/23/2013 - Review Complete 12/23/2013  Allergen Reaction Noted  . Codeine Hives 09/27/2008    Current Outpatient Prescriptions on File Prior to Visit  Medication Sig Dispense Refill  . acyclovir (ZOVIRAX) 400 MG tablet Take 1 tablet (400 mg total) by mouth daily. 90 tablet 5  . ALPRAZolam (XANAX) 0.25 MG tablet Take 1 po q 6 hours prn for anxiety 90 tablet 1  . aspirin 325 MG tablet Take 325 mg by mouth daily.      Marland Kitchen  cyanocobalamin (,VITAMIN B-12,) 1000 MCG/ML injection Inject 1 mL (1,000 mcg total) into the muscle every 30 (thirty) days. 3 mL 1  . esomeprazole (NEXIUM) 40 MG capsule Take 1 capsule (40 mg total) by mouth 2 (two) times daily. 180 capsule 3  . metFORMIN (GLUCOPHAGE-XR) 500 MG 24 hr tablet Take 500 mg by mouth 2 (two) times daily.    . metoprolol succinate (TOPROL-XL) 50 MG 24 hr tablet Take 1 tablet (50 mg total) by mouth daily with breakfast. Take with or immediately following a meal. 90 tablet 3  . nicotine (NICODERM CQ) 21 mg/24hr patch PLACE 1 PATCH (21 MG TOTAL) ONTO THE SKIN DAILY 14 patch 0  . nitroGLYCERIN (NITROSTAT) 0.4 MG SL tablet Place 1 tablet (0.4 mg total) under the tongue every 5 (five) minutes as needed for chest pain. 90 tablet 3  . pregabalin (LYRICA) 75 MG capsule 1 cap three times daily and 2 caps at bedtime 450 capsule 1  . rosuvastatin  (CRESTOR) 5 MG tablet Take 1 tablet (5 mg total) by mouth daily. 90 tablet 3  . tetrahydrozoline 0.05 % ophthalmic solution Place 1 drop into both eyes as needed. Red eyes    . topiramate (TOPAMAX) 25 MG tablet Take 1 tablet (25 mg total) by mouth 2 (two) times daily. 180 tablet 3   Current Facility-Administered Medications on File Prior to Visit  Medication Dose Route Frequency Provider Last Rate Last Dose  . sodium chloride 0.9 % injection 10 mL  10 mL Intracatheter PRN Deatra Robinson, MD   10 mL at 08/22/12 1721  . sodium chloride 0.9 % injection 10 mL  10 mL Intravenous PRN Deatra Robinson, MD   10 mL at 05/29/13 1323     REVIEW OF SYSTEMS: Cardiovascular: No chest pain, chest pressure, palpitations, orthopnea, or dyspnea on exertion. No claudication or rest pain,  No history of DVT or phlebitis. Pulmonary: positive productive cough Neurologic: No weakness, paresthesias, aphasia, or amaurosis. No dizziness. Hematologic: No bleeding problems or clotting disorders. Musculoskeletal: No joint pain or joint swelling. Gastrointestinal: No blood in stool or hematemesis Genitourinary: No dysuria or hematuria. Psychiatric:: No history of major depression. Integumentary: No rashes or ulcers. Constitutional: No fever or chills.  PHYSICAL EXAMINATION:   Vital signs are BP 120/69 mmHg  Pulse 55  Resp 18  Ht 5\' 11"  (1.803 m)  Wt 214 lb 4.8 oz (97.206 kg)  BMI 29.90 kg/m2 General: The patient appears their stated age. HEENT:  No gross abnormalities Pulmonary:  Non labored breathing Abdomen: Soft and non-tender.  No pulsatile mass Musculoskeletal: There are no major deformities. Neurologic: No focal weakness or paresthesias are detected, Skin: There are no ulcer or rashes noted. Psychiatric: The patient has normal affect. Cardiovascular: There is a regular rate and rhythm without significant murmur appreciated.pedal pulses are not palpable.   Diagnostic Studies I have ordered and  reviewed his vascular lab studies.  His ankle-brachial index is 1.2 on the right with triphasic waveforms and 1.2 on the left with triphasic waveforms.  No stenosis within the stent is identified.  Carotid artery disease is been evaluated with ultrasound and no significant stenosis was identified  Assessment: #1: Atherosclerosis with claudication #2: Carotid occlusive disease Plan: #1: The patient has no evidence of recurrence.  He'll continue with yearly surveillance ultrasound #2: Carotid ultrasound today shows widely patent bilateral carotid arteries  V. Leia Alf, M.D. [facility] Office: [phone] Pager:  [phone]

## 2013-12-23 NOTE — Addendum Note (Signed)
Addended by: Mena Goes on: 12/23/2013 03:22 PM   Modules accepted: Orders

## 2013-12-24 ENCOUNTER — Ambulatory Visit (INDEPENDENT_AMBULATORY_CARE_PROVIDER_SITE_OTHER): Payer: BC Managed Care – PPO | Admitting: Nurse Practitioner

## 2013-12-24 ENCOUNTER — Encounter: Payer: Self-pay | Admitting: Nurse Practitioner

## 2013-12-24 VITALS — BP 101/61 | HR 69 | Ht 71.5 in | Wt 215.0 lb

## 2013-12-24 DIAGNOSIS — G622 Polyneuropathy due to other toxic agents: Secondary | ICD-10-CM

## 2013-12-24 DIAGNOSIS — G619 Inflammatory polyneuropathy, unspecified: Secondary | ICD-10-CM

## 2013-12-24 DIAGNOSIS — G609 Hereditary and idiopathic neuropathy, unspecified: Secondary | ICD-10-CM

## 2013-12-24 MED ORDER — PREGABALIN 75 MG PO CAPS: ORAL_CAPSULE | ORAL | Status: DC

## 2013-12-24 MED ORDER — CYANOCOBALAMIN 1000 MCG/ML IJ SOLN: 1000.0000 ug | INTRAMUSCULAR | Status: DC

## 2013-12-24 MED ORDER — TOPIRAMATE 25 MG PO TABS: 25.0000 mg | ORAL_TABLET | Freq: Two times a day (BID) | ORAL | Status: DC

## 2013-12-24 NOTE — Progress Notes (Signed)
[facility]  PATIENT: Ryan Copeland DOB: January 12, 1951   REASON FOR VISIT: follow-up for polyneuropathy   HISTORY OF PRESENT ILLNESS:Ryan Copeland, 63 year old male returns for followup. He has a history of polyneuropathy which has been long-standing. He continues to have some pain, numbness and paresthesias in both feet left worse than right. Sensation is worse in great toes. His feet are cold he also has a burning sensation. Gabapentin was not beneficial for his symptoms. He was found to have a low B6 level and an extremely low vitamin B12 level at 121. He has been on vitamin B12 injections which his wife gives monthly.  He is sleeping well. He is currently taking Lyrica 75 mg 3 times a day and two 50 mg at night. He was placed on Transdermal cream last year, with good results. He has failed Cymbalta. He is also on Topamax. He states his walking and balance are unchanged. He has not fallen. He continues to work full time. 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 He returns for reevaluation. His neuropathy symptoms are stable.  REVIEW OF SYSTEMS: Full 14 system review of systems performed and notable only for those listed, all others are neg:  Constitutional: N/A  Cardiovascular: N/A  Ear/Nose/Throat: N/A  Skin: N/A  Eyes: N/A  Respiratory: N/A  Gastroitestinal: N/A  Hematology/Lymphatic: N/A  Endocrine: N/A Musculoskeletal:N/A  Allergy/Immunology: N/A  Neurological: numbness, tingling Psychiatric: N/A Sleep : NA   ALLERGIES: Allergies  Allergen Reactions  . Codeine Hives    Pt states he can take a few, more reaction with extended doses.    HOME MEDICATIONS: Outpatient Prescriptions Prior to Visit  Medication Sig Dispense Refill  . acyclovir (ZOVIRAX) 400 MG tablet Take 1 tablet (400 mg total) by mouth daily. 90 tablet 5  . ALPRAZolam (XANAX) 0.25 MG tablet Take 1 po q 6 hours prn for anxiety 90 tablet 1  . aspirin 325 MG tablet Take 325 mg by mouth daily.       . cyanocobalamin (,VITAMIN B-12,) 1000 MCG/ML injection Inject 1 mL (1,000 mcg total) into the muscle every 30 (thirty) days. 3 mL 1  . esomeprazole (NEXIUM) 40 MG capsule Take 1 capsule (40 mg total) by mouth 2 (two) times daily. 180 capsule 3  . metFORMIN (GLUCOPHAGE-XR) 500 MG 24 hr tablet Take 500 mg by mouth 2 (two) times daily.    . metoprolol succinate (TOPROL-XL) 50 MG 24 hr tablet Take 1 tablet (50 mg total) by mouth daily with breakfast. Take with or immediately following a meal. 90 tablet 3  . nicotine (NICODERM CQ) 21 mg/24hr patch PLACE 1 PATCH (21 MG TOTAL) ONTO THE SKIN DAILY 14 patch 0  . nitroGLYCERIN (NITROSTAT) 0.4 MG SL tablet Place 1 tablet (0.4 mg total) under the tongue every 5 (five) minutes as needed for chest pain. 90 tablet 3  . pregabalin (LYRICA) 75 MG capsule 1 cap three times daily and 2 caps at bedtime 450 capsule 1  . rosuvastatin (CRESTOR) 5 MG tablet Take 1 tablet (5 mg total) by mouth daily. 90 tablet 3  . tetrahydrozoline 0.05 % ophthalmic solution Place 1 drop into both eyes as needed. Red eyes    . topiramate (TOPAMAX) 25 MG tablet Take 1 tablet (25 mg total) by mouth 2 (two) times daily. 180 tablet 3   Facility-Administered Medications Prior to Visit  Medication Dose Route Frequency Provider Last Rate Last Dose  . sodium chloride 0.9 % injection 10 mL  10 mL  Intracatheter PRN Deatra Robinson, MD   10 mL at 08/22/12 1721  . sodium chloride 0.9 % injection 10 mL  10 mL Intravenous PRN Deatra Robinson, MD   10 mL at 05/29/13 1323    PAST MEDICAL HISTORY: Past Medical History  Diagnosis Date  . 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 Hyperlipidemia   . Tobacco abuse   . PVD (peripheral vascular disease)   . 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 Diabetes mellitus   . Neuromuscular disorder     peripheral neuropathy  . CAD (coronary artery disease)     PAST SURGICAL HISTORY: Past Surgical History    Procedure Laterality Date  . Femoral stents    . Coronary stent placement  May 2011  . Cholecystectomy  2007  . Carpel tunnel release Left [id number]  . Carpel tunnel release  Right 01-1989  . Tarsal tunnel release Bilateral 08-2007  . Left cai stent/pta and popliteal artery/tibial thrombectomy       FAMILY HISTORY: Family History  Problem Relation Age of Onset  . Lung cancer Mother 50  . Cancer Mother     lung  . Heart failure Father 25  . Heart disease Father     SOCIAL HISTORY: History   Social History  . Marital Status: Married    Spouse Name: [name]    Number of Children: 1  . Years of Education: College   Occupational History  .      [employer]   Social History Main Topics  . Smoking status: Current Every Day Smoker -- 1.00 packs/day for 43 years    Types: Cigarettes  . Smokeless tobacco: Former Systems developer  . Alcohol Use: No     Comment:  drinks non-alcoholic beer  . Drug Use: No  . Sexual Activity: Yes   Other Topics Concern  . Not on file   Social History Narrative   Patient lives at home with wife.   Caffeine Use: 15 cups of caffeine weekly     PHYSICAL EXAM  Filed Vitals:   12/24/13 1438  BP: 101/61  Pulse: 69  Height: 5' 11.5" (1.816 m)  Weight: 215 lb (97.523 kg)   Body mass index is 29.57 kg/(m^2). Generalized: Well developed, in no acute distress  Head: normocephalic and atraumatic,. Oropharynx benign  Neck: Supple, no carotid bruits  Cardiac: Regular rate rhythm, no murmur  Musculoskeletal: No deformity   Neurological examination   Mentation: Alert oriented to time, place, history taking. Follows all commands speech and language fluent  Cranial nerve II-XII: Pupils were equal round reactive to light extraocular movements were full, visual field were full on confrontational test. Facial sensation and strength were normal. hearing was intact to finger rubbing bilaterally. Uvula tongue midline. head turning and shoulder shrug were  normal and symmetric.Tongue protrusion into cheek strength was normal. Motor: normal bulk and tone, full strength in the BUE, BLE, fine finger movements normal, no pronator drift. No focal weakness Sensory: Diminished pinprick to the knees bilaterally, decreased vibratory to the toes, position sense is normal  Coordination: finger-nose-finger, heel-to-shin bilaterally, no dysmetria Reflexes: Diminished upper and lower and symmetric, plantar responses were flexor bilaterally. Gait and Station: Rising up from seated position without assistance, normal stance, moderate stride, good arm swing, smooth turning, able to perform tiptoe, and heel walking without difficulty. Tandem gait is steady. No assistive device  DIAGNOSTIC DATA (LABS, IMAGING, TESTING) - I reviewed patient  records, labs, notes, testing and imaging myself where available.  Lab Results  Component Value Date   WBC 6.1 11/29/2013   HGB 15.4 11/29/2013   HCT 42.4 11/29/2013   MCV 93.2 11/29/2013   PLT 85* 11/29/2013      Component Value Date/Time   NA 138 07/26/2013 1210   NA 138 05/03/2013 1359   K 4.0 07/26/2013 1210   K 4.4 05/03/2013 1359   CL 102 05/03/2013 1359   CL 104 08/03/2012 1229   CO2 24 07/26/2013 1210   CO2 28 05/03/2013 1359   GLUCOSE 130 07/26/2013 1210   GLUCOSE 192* 05/03/2013 1359   GLUCOSE 223* 08/03/2012 1229   BUN 12.5 07/26/2013 1210   BUN 12 05/03/2013 1359   CREATININE 1.0 07/26/2013 1210   CREATININE 0.9 05/03/2013 1359   CALCIUM 9.1 07/26/2013 1210   CALCIUM 9.4 05/03/2013 1359   PROT 6.3* 07/26/2013 1210   PROT 7.0 05/03/2013 1359   ALBUMIN 3.9 07/26/2013 1210   ALBUMIN 4.2 05/03/2013 1359   AST 16 07/26/2013 1210   AST 24 05/03/2013 1359   ALT 35 07/26/2013 1210   ALT 46 05/03/2013 1359   ALKPHOS 91 07/26/2013 1210   ALKPHOS 85 05/03/2013 1359   BILITOT 0.86 07/26/2013 1210   BILITOT 0.5 05/03/2013 1359   GFRNONAA 88* 06/01/2012 1145   GFRAA >90 06/01/2012 1145   Lab Results   Component Value Date   CHOL 147 05/03/2013   HDL 37.50* 05/03/2013   LDLCALC 72 05/03/2013   LDLDIRECT 117.6 08/22/2011   TRIG 187.0* 05/03/2013   CHOLHDL 4 05/03/2013   Lab Results  Component Value Date   HGBA1C 7.5* 07/26/2013   ASSESSMENT AND PLAN  63 y.o. year old male  has a past medical history of MI, acute, non ST segment elevation (06/28/2009); Hyperlipidemia; Tobacco abuse; PVD (peripheral vascular disease); CLL (chronic lymphocytic leukemia) (03/18/2011); Diabetes mellitus; Neuromuscular disorder; and CAD (coronary artery disease).and  long-standing history of polyneuropathy for  follow-up.  Continue Lyrica at current dose will refill Continue Topamax at current dose will refill Continue B12 injections monthly will refill F/U in 6 months Dennie Bible, [facility], [facility], APRN  [facility] [address], [place], [place] [address] [phone]

## 2013-12-24 NOTE — Patient Instructions (Signed)
Continue Lyrica at current dose will refill Continue Topamax at current dose will refill Continue B12 injections monthly will refill F/U in 6 months

## 2013-12-27 ENCOUNTER — Ambulatory Visit: Payer: Self-pay

## 2013-12-30 NOTE — Progress Notes (Signed)
I agree above plan. 

## 2014-01-03 ENCOUNTER — Ambulatory Visit (HOSPITAL_BASED_OUTPATIENT_CLINIC_OR_DEPARTMENT_OTHER): Payer: BC Managed Care – PPO | Admitting: Hematology and Oncology

## 2014-01-03 ENCOUNTER — Other Ambulatory Visit (HOSPITAL_BASED_OUTPATIENT_CLINIC_OR_DEPARTMENT_OTHER): Payer: BC Managed Care – PPO

## 2014-01-03 ENCOUNTER — Telehealth: Payer: Self-pay | Admitting: Hematology and Oncology

## 2014-01-03 ENCOUNTER — Ambulatory Visit (HOSPITAL_BASED_OUTPATIENT_CLINIC_OR_DEPARTMENT_OTHER): Payer: BC Managed Care – PPO

## 2014-01-03 ENCOUNTER — Other Ambulatory Visit: Payer: Self-pay | Admitting: Hematology and Oncology

## 2014-01-03 ENCOUNTER — Telehealth: Payer: Self-pay | Admitting: *Deleted

## 2014-01-03 ENCOUNTER — Encounter: Payer: Self-pay | Admitting: Hematology and Oncology

## 2014-01-03 VITALS — BP 146/77 | HR 63 | Temp 97.5°F | Resp 18 | Ht 71.5 in | Wt 219.4 lb

## 2014-01-03 DIAGNOSIS — C9110 Chronic lymphocytic leukemia of B-cell type not having achieved remission: Secondary | ICD-10-CM

## 2014-01-03 DIAGNOSIS — G609 Hereditary and idiopathic neuropathy, unspecified: Secondary | ICD-10-CM

## 2014-01-03 DIAGNOSIS — D839 Common variable immunodeficiency, unspecified: Secondary | ICD-10-CM

## 2014-01-03 DIAGNOSIS — D801 Nonfamilial hypogammaglobulinemia: Secondary | ICD-10-CM

## 2014-01-03 DIAGNOSIS — Z720 Tobacco use: Secondary | ICD-10-CM

## 2014-01-03 DIAGNOSIS — F17200 Nicotine dependence, unspecified, uncomplicated: Secondary | ICD-10-CM

## 2014-01-03 LAB — CBC WITH DIFFERENTIAL/PLATELET
BASO%: 0.2 % (ref 0.0–2.0)
BASOS ABS: 0 10*3/uL (ref 0.0–0.1)
EOS ABS: 0 10*3/uL (ref 0.0–0.5)
EOS%: 0.6 % (ref 0.0–7.0)
HEMATOCRIT: 43.8 % (ref 38.4–49.9)
HEMOGLOBIN: 15.6 g/dL (ref 13.0–17.1)
LYMPH%: 7.6 % — AB (ref 14.0–49.0)
MCH: 33.5 pg — ABNORMAL HIGH (ref 27.2–33.4)
MCHC: 35.6 g/dL (ref 32.0–36.0)
MCV: 94.2 fL (ref 79.3–98.0)
MONO#: 0.5 10*3/uL (ref 0.1–0.9)
MONO%: 7.5 % (ref 0.0–14.0)
NEUT%: 84.1 % — AB (ref 39.0–75.0)
NEUTROS ABS: 5.5 10*3/uL (ref 1.5–6.5)
PLATELETS: 96 10*3/uL — AB (ref 140–400)
RBC: 4.65 10*6/uL (ref 4.20–5.82)
RDW: 14.2 % (ref 11.0–14.6)
WBC: 6.6 10*3/uL (ref 4.0–10.3)
lymph#: 0.5 10*3/uL — ABNORMAL LOW (ref 0.9–3.3)

## 2014-01-03 MED ORDER — SODIUM CHLORIDE 0.9 % IV SOLN
INTRAVENOUS | Status: DC
  Administered 2014-01-03: 11:00:00 via INTRAVENOUS

## 2014-01-03 MED ORDER — NICOTINE 21 MG/24HR TD PT24: MEDICATED_PATCH | TRANSDERMAL | Status: DC

## 2014-01-03 MED ORDER — ACETAMINOPHEN 325 MG PO TABS
ORAL_TABLET | ORAL | Status: AC
  Filled 2014-01-03: qty 2

## 2014-01-03 MED ORDER — ACETAMINOPHEN 325 MG PO TABS
650.0000 mg | ORAL_TABLET | Freq: Four times a day (QID) | ORAL | Status: DC | PRN
  Administered 2014-01-03: 650 mg via ORAL

## 2014-01-03 MED ORDER — DIPHENHYDRAMINE HCL 25 MG PO CAPS
ORAL_CAPSULE | ORAL | Status: AC
  Filled 2014-01-03: qty 1

## 2014-01-03 MED ORDER — IMMUNE GLOBULIN (HUMAN) 10 GM/100ML IV SOLN
0.5000 g/kg | Freq: Once | INTRAVENOUS | Status: AC
  Administered 2014-01-03: 50 g via INTRAVENOUS
  Filled 2014-01-03: qty 500

## 2014-01-03 MED ORDER — DIPHENHYDRAMINE HCL 25 MG PO TABS
25.0000 mg | ORAL_TABLET | Freq: Once | ORAL | Status: AC
  Administered 2014-01-03: 25 mg via ORAL
  Filled 2014-01-03: qty 1

## 2014-01-03 NOTE — Progress Notes (Signed)
Patient observed 30 minutes post Octagam infusion. Patient discharged home ambulatory.

## 2014-01-03 NOTE — Assessment & Plan Note (Signed)
The patient has achieved complete response clinically. I will continue to see him every 6 months with history, physical examination and blood work and to defer imaging studies unless he had signs and symptoms to suggest disease recurrence.

## 2014-01-03 NOTE — Telephone Encounter (Signed)
Gave avs & cal for Dec thur May 2016.

## 2014-01-03 NOTE — Progress Notes (Signed)
[place] OFFICE PROGRESS NOTE  Patient Care Team: [name], PA-C as PCP - General (Family Medicine)  SUMMARY OF ONCOLOGIC HISTORY:  This patient was originally diagnosed with CLL after he was found to have leukocytosis. Imaging study with CT scan show diffuse lymphadenopathy with splenomegaly. Due to worsening thrombocytopenia, he was subsequently treated with combination therapy with fludarabine, Cytoxan and rituximab from May 2014 to October 2014. The patient denies side effects or complications from treatment. He has significant smoking history with suspected COPD. 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 From October 2014 onwards, he was prescribed monthly IVIG infusion.   INTERVAL HISTORY: Please see below for problem oriented charting. He feels well. Denies flare of peripheral neuropathy. Denies recent infection. He is attempting to quit smoking. He feels well.  REVIEW OF SYSTEMS:   Constitutional: Denies fevers, chills or abnormal weight loss Eyes: Denies blurriness of vision Ears, nose, mouth, throat, and face: Denies mucositis or sore throat Respiratory: Denies cough, dyspnea or wheezes Cardiovascular: Denies palpitation, chest discomfort or lower extremity swelling Gastrointestinal:  Denies nausea, heartburn or change in bowel habits Skin: Denies abnormal skin rashes Lymphatics: Denies new lymphadenopathy or easy bruising Neurological:Denies numbness, tingling or new weaknesses Behavioral/Psych: Mood is stable, no new changes  All other systems were reviewed with the patient and are negative.  I have reviewed the past medical history, past surgical history, social history and family history with the patient and they are unchanged from previous note.  ALLERGIES:  is allergic to codeine.  MEDICATIONS:  Current Outpatient Prescriptions  Medication Sig Dispense Refill  . acyclovir (ZOVIRAX) 400 MG  tablet Take 1 tablet (400 mg total) by mouth daily. 90 tablet 5  . ALPRAZolam (XANAX) 0.25 MG tablet Take 1 po q 6 hours prn for anxiety 90 tablet 1  . aspirin 325 MG tablet Take 325 mg by mouth daily.      . cyanocobalamin (,VITAMIN B-12,) 1000 MCG/ML injection Inject 1 mL (1,000 mcg total) into the muscle every 30 (thirty) days. 3 mL 1  . esomeprazole (NEXIUM) 40 MG capsule Take 1 capsule (40 mg total) by mouth 2 (two) times daily. 180 capsule 3  . HYDROcodone-acetaminophen (NORCO/VICODIN) 5-325 MG per tablet every 6 (six) hours as needed.   0  . metFORMIN (GLUCOPHAGE-XR) 500 MG 24 hr tablet Take 500 mg by mouth 2 (two) times daily.    . metoprolol succinate (TOPROL-XL) 50 MG 24 hr tablet Take 1 tablet (50 mg total) by mouth daily with breakfast. Take with or immediately following a meal. 90 tablet 3  . nicotine (NICODERM CQ) 21 mg/24hr patch PLACE 1 PATCH (21 MG TOTAL) ONTO THE SKIN DAILY 14 patch 0  . nitroGLYCERIN (NITROSTAT) 0.4 MG SL tablet Place 1 tablet (0.4 mg total) under the tongue every 5 (five) minutes as needed for chest pain. 90 tablet 3  . pregabalin (LYRICA) 75 MG capsule 1 cap three times daily and 2 caps at bedtime 450 capsule 1  . rosuvastatin (CRESTOR) 5 MG tablet Take 1 tablet (5 mg total) by mouth daily. 90 tablet 3  . tetrahydrozoline 0.05 % ophthalmic solution Place 1 drop into both eyes as needed. Red eyes    . topiramate (TOPAMAX) 25 MG tablet Take 1 tablet (25 mg total) by mouth 2 (two) times daily. 180 tablet 3  . VIAGRA 100 MG tablet   0  . zolpidem (AMBIEN) 10 MG tablet   0   No current facility-administered  medications for this visit.   Facility-Administered Medications Ordered in Other Visits  Medication Dose Route Frequency Provider Last Rate Last Dose  . sodium chloride 0.9 % injection 10 mL  10 mL Intracatheter PRN Deatra Robinson, MD   10 mL at 08/22/12 1721  . sodium chloride 0.9 % injection 10 mL  10 mL Intravenous PRN Deatra Robinson, MD   10 mL at  05/29/13 1323    PHYSICAL EXAMINATION: ECOG PERFORMANCE STATUS: 0 - Asymptomatic  Filed Vitals:   01/03/14 1010  BP: 146/77  Pulse: 63  Temp: 97.5 F (36.4 C)  Resp: 18   Filed Weights   01/03/14 1010  Weight: 219 lb 6.4 oz (99.519 kg)    GENERAL:alert, no distress and comfortable. He is moderately obese SKIN: skin color, texture, turgor are normal, no rashes or significant lesions. Noticed some mild skin bruising EYES: normal, Conjunctiva are pink and non-injected, sclera clear OROPHARYNX:no exudate, no erythema and lips, buccal mucosa, and tongue normal  NECK: supple, thyroid normal size, non-tender, without nodularity LYMPH:  no palpable lymphadenopathy in the cervical, axillary or inguinal LUNGS: clear to auscultation and percussion with normal breathing effort HEART: regular rate & rhythm and no murmurs and no lower extremity edema ABDOMEN:abdomen soft, non-tender and normal bowel sounds Musculoskeletal:no cyanosis of digits and no clubbing  NEURO: alert & oriented x 3 with fluent speech, no focal motor/sensory deficits  LABORATORY DATA:  I have reviewed the data as listed    Component Value Date/Time   NA 138 07/26/2013 1210   NA 138 05/03/2013 1359   K 4.0 07/26/2013 1210   K 4.4 05/03/2013 1359   CL 102 05/03/2013 1359   CL 104 08/03/2012 1229   CO2 24 07/26/2013 1210   CO2 28 05/03/2013 1359   GLUCOSE 130 07/26/2013 1210   GLUCOSE 192* 05/03/2013 1359   GLUCOSE 223* 08/03/2012 1229   BUN 12.5 07/26/2013 1210   BUN 12 05/03/2013 1359   CREATININE 1.0 07/26/2013 1210   CREATININE 0.9 05/03/2013 1359   CALCIUM 9.1 07/26/2013 1210   CALCIUM 9.4 05/03/2013 1359   PROT 6.3* 07/26/2013 1210   PROT 7.0 05/03/2013 1359   ALBUMIN 3.9 07/26/2013 1210   ALBUMIN 4.2 05/03/2013 1359   AST 16 07/26/2013 1210   AST 24 05/03/2013 1359   ALT 35 07/26/2013 1210   ALT 46 05/03/2013 1359   ALKPHOS 91 07/26/2013 1210   ALKPHOS 85 05/03/2013 1359   BILITOT 0.86  07/26/2013 1210   BILITOT 0.5 05/03/2013 1359   GFRNONAA 88* 06/01/2012 1145   GFRAA >90 06/01/2012 1145    No results found for: SPEP, UPEP  Lab Results  Component Value Date   WBC 6.6 01/03/2014   NEUTROABS 5.5 01/03/2014   HGB 15.6 01/03/2014   HCT 43.8 01/03/2014   MCV 94.2 01/03/2014   PLT 96* 01/03/2014      Chemistry      Component Value Date/Time   NA 138 07/26/2013 1210   NA 138 05/03/2013 1359   K 4.0 07/26/2013 1210   K 4.4 05/03/2013 1359   CL 102 05/03/2013 1359   CL 104 08/03/2012 1229   CO2 24 07/26/2013 1210   CO2 28 05/03/2013 1359   BUN 12.5 07/26/2013 1210   BUN 12 05/03/2013 1359   CREATININE 1.0 07/26/2013 1210   CREATININE 0.9 05/03/2013 1359      Component Value Date/Time   CALCIUM 9.1 07/26/2013 1210   CALCIUM 9.4 05/03/2013 1359  ALKPHOS 91 07/26/2013 1210   ALKPHOS 85 05/03/2013 1359   AST 16 07/26/2013 1210   AST 24 05/03/2013 1359   ALT 35 07/26/2013 1210   ALT 46 05/03/2013 1359   BILITOT 0.86 07/26/2013 1210   BILITOT 0.5 05/03/2013 1359     ASSESSMENT & PLAN:  CLL (chronic lymphocytic leukemia) The patient has achieved complete response clinically. I will continue to see him every 6 months with history, physical examination and blood work and to defer imaging studies unless he had signs and symptoms to suggest disease recurrence.    Hereditary and idiopathic peripheral neuropathy He is seeing neurologist and is stable. IVIG should help as well.  Tobacco abuse I spent some time counseling the patient the importance of tobacco cessation. he is currently attempting to quit on his own  Hypogammaglobulinemia, acquired He has significant signs and symptoms of upper respiratory tract congestion since continuation of IVIG. The patient requested to resume therapy which I think is reasonable, to prevent risk of infection. The risk and benefits and side effects of IVIG infusion has been discussed with patient and he agreed to  proceed. He has no side effects of IVIG or signs of serum sickness.    No orders of the defined types were placed in this encounter.   All questions were answered. The patient knows to call the clinic with any problems, questions or concerns. No barriers to learning was detected. I spent 25 minutes counseling the patient face to face. The total time spent in the appointment was 30 minutes and more than 50% was on counseling and review of test results     [facility], [place], MD 01/03/2014 10:26 AM

## 2014-01-03 NOTE — Assessment & Plan Note (Signed)
He is seeing neurologist and is stable. IVIG should help as well.

## 2014-01-03 NOTE — Assessment & Plan Note (Signed)
He has significant signs and symptoms of upper respiratory tract congestion since continuation of IVIG. The patient requested to resume therapy which I think is reasonable, to prevent risk of infection. The risk and benefits and side effects of IVIG infusion has been discussed with patient and he agreed to proceed. He has no side effects of IVIG or signs of serum sickness.

## 2014-01-03 NOTE — Patient Instructions (Signed)

## 2014-01-03 NOTE — Assessment & Plan Note (Signed)
I spent some time counseling the patient the importance of tobacco cessation. he is currently attempting to quit on his own

## 2014-01-03 NOTE — Telephone Encounter (Signed)
Per staff message and POF I have scheduled appts. Advised scheduler of appts. JMW  

## 2014-01-06 ENCOUNTER — Ambulatory Visit: Payer: BC Managed Care – PPO | Admitting: Nurse Practitioner

## 2014-01-16 ENCOUNTER — Telehealth: Payer: Self-pay | Admitting: *Deleted

## 2014-01-16 NOTE — Telephone Encounter (Signed)
[facility] requests effient refill for this patient. Should he still be taking this? It was removed from his med list on 06/26/13 by his oncologist. Please advise. Thanks, MI

## 2014-01-16 NOTE — Telephone Encounter (Signed)
Called and spoke with patient to clarify whether or not patient is taking Effient.  Patient states he has stopped taking in preparation for oral surgery but was never told officially by Truitt Merle, NP to stop.  Patient states he is due for follow-up and requested appointment with [name] before the end of the year.  I scheduled patient for appointment on 12/22 and advised that per Dr. Acie Fredrickson patient can remain off Effient presently since his last stent was in 2011.  Patient verbalized understanding and agreement.

## 2014-01-21 ENCOUNTER — Telehealth: Payer: Self-pay | Admitting: *Deleted

## 2014-01-21 ENCOUNTER — Encounter: Payer: Self-pay | Admitting: Hematology and Oncology

## 2014-01-21 ENCOUNTER — Telehealth: Payer: Self-pay | Admitting: Nurse Practitioner

## 2014-01-21 NOTE — Telephone Encounter (Signed)
Dr Elsie Saas -orthopedic surgeon

## 2014-01-21 NOTE — Telephone Encounter (Signed)
New Msg   Dr. Noemi Chapel will be faxing over request for surgical clearance for elbow surgery and pt wanted to inform Truitt Merle. Pt states it is critical that this be completed. Pt may be reached at [phone].

## 2014-01-21 NOTE — Telephone Encounter (Signed)
Faxed letter to Dr Archie Endo office. OK to proceed with orthopedic surgery from Dr Calton Dach hematology standpoint

## 2014-01-22 NOTE — Telephone Encounter (Signed)
Note given to Dr. Calton Dach nurse.

## 2014-01-22 NOTE — Telephone Encounter (Signed)
Need to locate the form. I have not received.

## 2014-01-22 NOTE — Telephone Encounter (Signed)
S/w pt stated wanted me to be aware I will be receiving a form from Dr. Antonieta Pert er pt stated needs surgical clearance before end of year and office only has one slot open.  I stated will call pt after I received letter and [name] looks at it. Pt stated verbal understanding.

## 2014-01-23 ENCOUNTER — Encounter (HOSPITAL_COMMUNITY): Payer: Self-pay | Admitting: Cardiology

## 2014-01-23 ENCOUNTER — Encounter: Payer: Self-pay | Admitting: Nurse Practitioner

## 2014-01-24 ENCOUNTER — Telehealth: Payer: Self-pay | Admitting: Nurse Practitioner

## 2014-01-24 NOTE — Telephone Encounter (Signed)
Received request from Nurse fax box, documents faxed for surgical clearance. To: [place] Fax number: [phone] Attention: [phone]/km

## 2014-01-24 NOTE — Telephone Encounter (Signed)
S/w pt is aware that surgical clearance paperwork will be faxed over today to [place] office.  Dropped off to [facility].

## 2014-01-27 ENCOUNTER — Telehealth: Payer: Self-pay | Admitting: Hematology and Oncology

## 2014-01-27 ENCOUNTER — Other Ambulatory Visit: Payer: Self-pay | Admitting: Hematology and Oncology

## 2014-01-27 ENCOUNTER — Telehealth: Payer: Self-pay

## 2014-01-27 NOTE — Telephone Encounter (Signed)
s.w. pt and advised on added 12.28 appt.Marland KitchenMarland KitchenMarland KitchenMarland Kitchenpt ok and aware...he will ck mychart

## 2014-01-27 NOTE — Telephone Encounter (Signed)
Fax sent to [place] stating pt is not cleared for surgery due to low platelets.  Patient has follow up appt with Dr Alvy Bimler on 02/10/14

## 2014-01-29 ENCOUNTER — Encounter: Payer: Self-pay | Admitting: Hematology and Oncology

## 2014-01-30 ENCOUNTER — Telehealth: Payer: Self-pay | Admitting: *Deleted

## 2014-01-30 ENCOUNTER — Encounter: Payer: Self-pay | Admitting: Hematology and Oncology

## 2014-01-30 ENCOUNTER — Encounter: Payer: Self-pay | Admitting: *Deleted

## 2014-01-30 ENCOUNTER — Ambulatory Visit (HOSPITAL_BASED_OUTPATIENT_CLINIC_OR_DEPARTMENT_OTHER): Payer: BC Managed Care – PPO | Admitting: Hematology and Oncology

## 2014-01-30 ENCOUNTER — Other Ambulatory Visit (HOSPITAL_BASED_OUTPATIENT_CLINIC_OR_DEPARTMENT_OTHER): Payer: BC Managed Care – PPO

## 2014-01-30 VITALS — BP 138/72 | HR 60 | Temp 98.1°F | Resp 18 | Ht 71.5 in | Wt 218.2 lb

## 2014-01-30 DIAGNOSIS — C9110 Chronic lymphocytic leukemia of B-cell type not having achieved remission: Secondary | ICD-10-CM

## 2014-01-30 DIAGNOSIS — F419 Anxiety disorder, unspecified: Secondary | ICD-10-CM

## 2014-01-30 DIAGNOSIS — D801 Nonfamilial hypogammaglobulinemia: Secondary | ICD-10-CM

## 2014-01-30 DIAGNOSIS — Z720 Tobacco use: Secondary | ICD-10-CM

## 2014-01-30 DIAGNOSIS — D696 Thrombocytopenia, unspecified: Secondary | ICD-10-CM

## 2014-01-30 DIAGNOSIS — Z01818 Encounter for other preprocedural examination: Secondary | ICD-10-CM | POA: Insufficient documentation

## 2014-01-30 HISTORY — DX: Anxiety disorder, unspecified: F41.9

## 2014-01-30 LAB — CBC WITH DIFFERENTIAL/PLATELET
BASO%: 0.2 % (ref 0.0–2.0)
Basophils Absolute: 0 10*3/uL (ref 0.0–0.1)
EOS ABS: 0.1 10*3/uL (ref 0.0–0.5)
EOS%: 1 % (ref 0.0–7.0)
HCT: 43.2 % (ref 38.4–49.9)
HGB: 15.5 g/dL (ref 13.0–17.1)
LYMPH%: 3.7 % — AB (ref 14.0–49.0)
MCH: 33.8 pg — AB (ref 27.2–33.4)
MCHC: 35.9 g/dL (ref 32.0–36.0)
MCV: 94.1 fL (ref 79.3–98.0)
MONO#: 0.5 10*3/uL (ref 0.1–0.9)
MONO%: 10.3 % (ref 0.0–14.0)
NEUT%: 84.8 % — ABNORMAL HIGH (ref 39.0–75.0)
NEUTROS ABS: 4.4 10*3/uL (ref 1.5–6.5)
PLATELETS: 78 10*3/uL — AB (ref 140–400)
RBC: 4.59 10*6/uL (ref 4.20–5.82)
RDW: 13.9 % (ref 11.0–14.6)
WBC: 5.1 10*3/uL (ref 4.0–10.3)
lymph#: 0.2 10*3/uL — ABNORMAL LOW (ref 0.9–3.3)

## 2014-01-30 MED ORDER — NICOTINE 14 MG/24HR TD PT24: 14.0000 mg | MEDICATED_PATCH | Freq: Every day | TRANSDERMAL | Status: DC

## 2014-01-30 MED ORDER — NICOTINE 7 MG/24HR TD PT24: 7.0000 mg | MEDICATED_PATCH | Freq: Every day | TRANSDERMAL | Status: DC

## 2014-01-30 MED ORDER — ALPRAZOLAM 0.25 MG PO TABS
ORAL_TABLET | ORAL | Status: DC
Start: 2014-01-30 — End: 2016-05-24

## 2014-01-30 MED ORDER — NICOTINE 21 MG/24HR TD PT24: 21.0000 mg | MEDICATED_PATCH | Freq: Every day | TRANSDERMAL | Status: DC

## 2014-01-30 NOTE — Assessment & Plan Note (Signed)
The patient has achieved complete response clinically. I will continue to see him every 6 months with history, physical examination and blood work and to defer imaging studies unless he had signs and symptoms to suggest disease recurrence.

## 2014-01-30 NOTE — Assessment & Plan Note (Signed)
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 He is not symptomatic.

## 2014-01-30 NOTE — Telephone Encounter (Signed)
Informed pt of cancellation today and he says he can come in at 11;30 am for lab and see Dr. Alvy Bimler at noon.

## 2014-01-30 NOTE — Progress Notes (Signed)
Faxed letter from Dr. Alvy Bimler to Dr. Noemi Chapel for clearance for orthopedic surgery.

## 2014-01-30 NOTE — Assessment & Plan Note (Signed)
The patient desire orthopedic surgery to his left elbow as soon as possible before the new year. After speaking with his surgeon, his surgeon one to his platelet count closer to 100,000 if possible. Repeat CBC today showed platelet count of 78,000. The patient is not symptomatic. There is no contraindication for him to proceed with orthopedic surgery as well as his blood count is above 50,000.  Since his orthopedic surgeon desired a higher platelet count, I recommend prophylactic platelet transfusion 1-2 hours before surgery.  I gave him a letter of clearance from the hematology standpoint and will fax a copy to his office

## 2014-01-30 NOTE — Assessment & Plan Note (Signed)
He has significant signs and symptoms of upper respiratory tract congestion since continuation of IVIG. The patient requested to resume therapy which I think is reasonable, to prevent risk of infection. The risk and benefits and side effects of IVIG infusion has been discussed with patient and he agreed to proceed. He has no side effects of IVIG or signs of serum sickness.

## 2014-01-30 NOTE — Progress Notes (Signed)
[facility] OFFICE PROGRESS NOTE  Patient Care Team: [name], PA-C as PCP - General (Family Medicine) Lorn Junes, MD as Consulting Physician (Orthopedic Surgery)  SUMMARY OF ONCOLOGIC HISTORY: This patient was originally diagnosed with CLL after he was found to have leukocytosis. Imaging study with CT scan show diffuse lymphadenopathy with splenomegaly. Due to worsening thrombocytopenia, he was subsequently treated with combination therapy with fludarabine, Cytoxan and rituximab from May 2014 to October 2014. The patient denies side effects or complications from treatment. He has significant smoking history with suspected COPD. 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 From October 2014 onwards, he was prescribed monthly IVIG infusion.  INTERVAL HISTORY: Please see below for problem oriented charting. He is seen urgently today for preoperative clearance prior to left elbow surgery. He feels well. Denies recent infection. The patient denies any recent signs or symptoms of bleeding such as spontaneous epistaxis, hematuria or hematochezia.   REVIEW OF SYSTEMS:   Constitutional: Denies fevers, chills or abnormal weight loss Eyes: Denies blurriness of vision Ears, nose, mouth, throat, and face: Denies mucositis or sore throat Respiratory: Denies cough, dyspnea or wheezes Cardiovascular: Denies palpitation, chest discomfort or lower extremity swelling Gastrointestinal:  Denies nausea, heartburn or change in bowel habits Skin: Denies abnormal skin rashes Lymphatics: Denies new lymphadenopathy or easy bruising Neurological:Denies numbness, tingling or new weaknesses Behavioral/Psych: Mood is stable, no new changes  All other systems were reviewed with the patient and are negative.  I have reviewed the past medical history, past surgical history, social history and family history with the patient and they are  unchanged from previous note.  ALLERGIES:  is allergic to codeine.  MEDICATIONS:  Current Outpatient Prescriptions  Medication Sig Dispense Refill  . acyclovir (ZOVIRAX) 400 MG tablet Take 1 tablet (400 mg total) by mouth daily. 90 tablet 5  . ALPRAZolam (XANAX) 0.25 MG tablet Take 1 po q 6 hours prn for anxiety 60 tablet 0  . aspirin 325 MG tablet Take 325 mg by mouth daily.      . cyanocobalamin (,VITAMIN B-12,) 1000 MCG/ML injection Inject 1 mL (1,000 mcg total) into the muscle every 30 (thirty) days. 3 mL 1  . esomeprazole (NEXIUM) 40 MG capsule Take 1 capsule (40 mg total) by mouth 2 (two) times daily. 180 capsule 3  . HYDROcodone-acetaminophen (NORCO/VICODIN) 5-325 MG per tablet every 6 (six) hours as needed.   0  . metFORMIN (GLUCOPHAGE-XR) 500 MG 24 hr tablet Take 500 mg by mouth 2 (two) times daily.    . metoprolol succinate (TOPROL-XL) 50 MG 24 hr tablet Take 1 tablet (50 mg total) by mouth daily with breakfast. Take with or immediately following a meal. 90 tablet 3  . nicotine (NICODERM CQ) 21 mg/24hr patch PLACE 1 PATCH (21 MG TOTAL) ONTO THE SKIN DAILY 28 patch 0  . nitroGLYCERIN (NITROSTAT) 0.4 MG SL tablet Place 1 tablet (0.4 mg total) under the tongue every 5 (five) minutes as needed for chest pain. 90 tablet 3  . pregabalin (LYRICA) 75 MG capsule 1 cap three times daily and 2 caps at bedtime 450 capsule 1  . rosuvastatin (CRESTOR) 5 MG tablet Take 1 tablet (5 mg total) by mouth daily. 90 tablet 3  . tetrahydrozoline 0.05 % ophthalmic solution Place 1 drop into both eyes as needed. Red eyes    . topiramate (TOPAMAX) 25 MG tablet Take 1 tablet (25 mg total) by mouth 2 (two) times daily. 180 tablet 3  .  VIAGRA 100 MG tablet   0  . zolpidem (AMBIEN) 10 MG tablet   0  . nicotine (NICODERM CQ - DOSED IN MG/24 HOURS) 14 mg/24hr patch Place 1 patch (14 mg total) onto the skin daily. 28 patch 0  . nicotine (NICODERM CQ - DOSED IN MG/24 HOURS) 21 mg/24hr patch Place 1 patch (21 mg  total) onto the skin daily. 28 patch 0  . nicotine (NICODERM CQ - DOSED IN MG/24 HR) 7 mg/24hr patch Place 1 patch (7 mg total) onto the skin daily. 28 patch 0   No current facility-administered medications for this visit.   Facility-Administered Medications Ordered in Other Visits  Medication Dose Route Frequency Provider Last Rate Last Dose  . sodium chloride 0.9 % injection 10 mL  10 mL Intracatheter PRN Deatra Robinson, MD   10 mL at 08/22/12 1721  . sodium chloride 0.9 % injection 10 mL  10 mL Intravenous PRN Deatra Robinson, MD   10 mL at 05/29/13 1323    PHYSICAL EXAMINATION: ECOG PERFORMANCE STATUS: 0 - Asymptomatic  Filed Vitals:   01/30/14 1208  BP: 138/72  Pulse: 60  Temp: 98.1 F (36.7 C)  Resp: 18   Filed Weights   01/30/14 1208  Weight: 218 lb 3.2 oz (98.975 kg)    GENERAL:alert, no distress and comfortable SKIN: skin color, texture, turgor are normal, no rashes or significant lesions EYES: normal, Conjunctiva are pink and non-injected, sclera clear OROPHARYNX:no exudate, no erythema and lips, buccal mucosa, and tongue normal  NECK: supple, thyroid normal size, non-tender, without nodularity LYMPH:  no palpable lymphadenopathy in the cervical, axillary or inguinal LUNGS: clear to auscultation and percussion with normal breathing effort HEART: regular rate & rhythm and no murmurs and no lower extremity edema ABDOMEN:abdomen soft, non-tender and normal bowel sounds Musculoskeletal:no cyanosis of digits and no clubbing  NEURO: alert & oriented x 3 with fluent speech, no focal motor/sensory deficits  LABORATORY DATA:  I have reviewed the data as listed    Component Value Date/Time   NA 138 07/26/2013 1210   NA 138 05/03/2013 1359   K 4.0 07/26/2013 1210   K 4.4 05/03/2013 1359   CL 102 05/03/2013 1359   CL 104 08/03/2012 1229   CO2 24 07/26/2013 1210   CO2 28 05/03/2013 1359   GLUCOSE 130 07/26/2013 1210   GLUCOSE 192* 05/03/2013 1359   GLUCOSE 223*  08/03/2012 1229   BUN 12.5 07/26/2013 1210   BUN 12 05/03/2013 1359   CREATININE 1.0 07/26/2013 1210   CREATININE 0.9 05/03/2013 1359   CALCIUM 9.1 07/26/2013 1210   CALCIUM 9.4 05/03/2013 1359   PROT 6.3* 07/26/2013 1210   PROT 7.0 05/03/2013 1359   ALBUMIN 3.9 07/26/2013 1210   ALBUMIN 4.2 05/03/2013 1359   AST 16 07/26/2013 1210   AST 24 05/03/2013 1359   ALT 35 07/26/2013 1210   ALT 46 05/03/2013 1359   ALKPHOS 91 07/26/2013 1210   ALKPHOS 85 05/03/2013 1359   BILITOT 0.86 07/26/2013 1210   BILITOT 0.5 05/03/2013 1359   GFRNONAA 88* 06/01/2012 1145   GFRAA >90 06/01/2012 1145    No results found for: SPEP, UPEP  Lab Results  Component Value Date   WBC 5.1 01/30/2014   NEUTROABS 4.4 01/30/2014   HGB 15.5 01/30/2014   HCT 43.2 01/30/2014   MCV 94.1 01/30/2014   PLT 78* 01/30/2014      Chemistry      Component Value Date/Time   NA  138 07/26/2013 1210   NA 138 05/03/2013 1359   K 4.0 07/26/2013 1210   K 4.4 05/03/2013 1359   CL 102 05/03/2013 1359   CL 104 08/03/2012 1229   CO2 24 07/26/2013 1210   CO2 28 05/03/2013 1359   BUN 12.5 07/26/2013 1210   BUN 12 05/03/2013 1359   CREATININE 1.0 07/26/2013 1210   CREATININE 0.9 05/03/2013 1359      Component Value Date/Time   CALCIUM 9.1 07/26/2013 1210   CALCIUM 9.4 05/03/2013 1359   ALKPHOS 91 07/26/2013 1210   ALKPHOS 85 05/03/2013 1359   AST 16 07/26/2013 1210   AST 24 05/03/2013 1359   ALT 35 07/26/2013 1210   ALT 46 05/03/2013 1359   BILITOT 0.86 07/26/2013 1210   BILITOT 0.5 05/03/2013 1359     ASSESSMENT & PLAN:  CLL (chronic lymphocytic leukemia) The patient has achieved complete response clinically. I will continue to see him every 6 months with history, physical examination and blood work and to defer imaging studies unless he had signs and symptoms to suggest disease recurrence.    Preoperative clearance The patient desire orthopedic surgery to his left elbow as soon as possible before the  new year. After speaking with his surgeon, his surgeon one to his platelet count closer to 100,000 if possible. Repeat CBC today showed platelet count of 78,000. The patient is not symptomatic. There is no contraindication for him to proceed with orthopedic surgery as well as his blood count is above 50,000.  Since his orthopedic surgeon desired a higher platelet count, I recommend prophylactic platelet transfusion 1-2 hours before surgery.  I gave him a letter of clearance from the hematology standpoint and will fax a copy to his office   Tobacco abuse He is motivated to quit smoking and is down to half a pack of cigarettes per day. He requests a prescription of nicotine patch   Hypogammaglobulinemia, acquired He has significant signs and symptoms of upper respiratory tract congestion since continuation of IVIG. The patient requested to resume therapy which I think is reasonable, to prevent risk of infection. The risk and benefits and side effects of IVIG infusion has been discussed with patient and he agreed to proceed. He has no side effects of IVIG or signs of serum sickness.  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 He is not symptomatic.   No orders of the defined types were placed in this encounter.   All questions were answered. The patient knows to call the clinic with any problems, questions or concerns. No barriers to learning was detected. I spent 30 minutes counseling the patient face to face. The total time spent in the appointment was 40 minutes and more than 50% was on counseling and review of test results     [facility], [place], MD 01/30/2014 1:26 PM

## 2014-01-30 NOTE — Assessment & Plan Note (Signed)
He is motivated to quit smoking and is down to half a pack of cigarettes per day. He requests a prescription of nicotine patch

## 2014-01-31 ENCOUNTER — Other Ambulatory Visit: Payer: BC Managed Care – PPO

## 2014-01-31 ENCOUNTER — Ambulatory Visit (HOSPITAL_BASED_OUTPATIENT_CLINIC_OR_DEPARTMENT_OTHER): Payer: BC Managed Care – PPO

## 2014-01-31 DIAGNOSIS — C9110 Chronic lymphocytic leukemia of B-cell type not having achieved remission: Secondary | ICD-10-CM

## 2014-01-31 DIAGNOSIS — D801 Nonfamilial hypogammaglobulinemia: Secondary | ICD-10-CM

## 2014-01-31 MED ORDER — DIPHENHYDRAMINE HCL 25 MG PO TABS
25.0000 mg | ORAL_TABLET | Freq: Once | ORAL | Status: AC
  Administered 2014-01-31: 25 mg via ORAL
  Filled 2014-01-31: qty 1

## 2014-01-31 MED ORDER — ACETAMINOPHEN 325 MG PO TABS
ORAL_TABLET | ORAL | Status: AC
  Filled 2014-01-31: qty 2

## 2014-01-31 MED ORDER — ACETAMINOPHEN 325 MG PO TABS
650.0000 mg | ORAL_TABLET | Freq: Four times a day (QID) | ORAL | Status: DC | PRN
  Administered 2014-01-31: 650 mg via ORAL

## 2014-01-31 MED ORDER — IMMUNE GLOBULIN (HUMAN) 10 GM/100ML IV SOLN
0.5000 g/kg | Freq: Once | INTRAVENOUS | Status: AC
  Administered 2014-01-31: 50 g via INTRAVENOUS
  Filled 2014-01-31: qty 500

## 2014-01-31 MED ORDER — SODIUM CHLORIDE 0.9 % IV SOLN
Freq: Once | INTRAVENOUS | Status: AC
  Administered 2014-01-31: 11:00:00 via INTRAVENOUS

## 2014-01-31 MED ORDER — DIPHENHYDRAMINE HCL 25 MG PO CAPS
ORAL_CAPSULE | ORAL | Status: AC
  Filled 2014-01-31: qty 1

## 2014-01-31 NOTE — Patient Instructions (Signed)

## 2014-02-03 ENCOUNTER — Other Ambulatory Visit: Payer: Self-pay | Admitting: *Deleted

## 2014-02-03 ENCOUNTER — Encounter (HOSPITAL_COMMUNITY): Payer: Self-pay

## 2014-02-03 ENCOUNTER — Encounter (HOSPITAL_COMMUNITY)
Admission: RE | Admit: 2014-02-03 | Discharge: 2014-02-03 | Disposition: A | Discharge status: Home / Self Care | Payer: BC Managed Care – PPO | Source: Ambulatory Visit | Attending: Orthopedic Surgery | Admitting: Orthopedic Surgery

## 2014-02-03 DIAGNOSIS — M94222 Chondromalacia, left elbow: Secondary | ICD-10-CM | POA: Diagnosis not present

## 2014-02-03 DIAGNOSIS — M66222 Spontaneous rupture of extensor tendons, left upper arm: Secondary | ICD-10-CM | POA: Diagnosis not present

## 2014-02-03 DIAGNOSIS — F419 Anxiety disorder, unspecified: Secondary | ICD-10-CM | POA: Insufficient documentation

## 2014-02-03 DIAGNOSIS — I252 Old myocardial infarction: Secondary | ICD-10-CM | POA: Insufficient documentation

## 2014-02-03 DIAGNOSIS — E785 Hyperlipidemia, unspecified: Secondary | ICD-10-CM | POA: Diagnosis not present

## 2014-02-03 DIAGNOSIS — E1140 Type 2 diabetes mellitus with diabetic neuropathy, unspecified: Secondary | ICD-10-CM | POA: Insufficient documentation

## 2014-02-03 DIAGNOSIS — F17200 Nicotine dependence, unspecified, uncomplicated: Secondary | ICD-10-CM | POA: Diagnosis not present

## 2014-02-03 DIAGNOSIS — M7712 Lateral epicondylitis, left elbow: Secondary | ICD-10-CM | POA: Diagnosis present

## 2014-02-03 DIAGNOSIS — D693 Immune thrombocytopenic purpura: Secondary | ICD-10-CM | POA: Insufficient documentation

## 2014-02-03 DIAGNOSIS — I2510 Atherosclerotic heart disease of native coronary artery without angina pectoris: Secondary | ICD-10-CM | POA: Insufficient documentation

## 2014-02-03 DIAGNOSIS — Z01818 Encounter for other preprocedural examination: Secondary | ICD-10-CM | POA: Insufficient documentation

## 2014-02-03 DIAGNOSIS — I6523 Occlusion and stenosis of bilateral carotid arteries: Secondary | ICD-10-CM | POA: Diagnosis not present

## 2014-02-03 DIAGNOSIS — Z95818 Presence of other cardiac implants and grafts: Secondary | ICD-10-CM | POA: Insufficient documentation

## 2014-02-03 DIAGNOSIS — F17210 Nicotine dependence, cigarettes, uncomplicated: Secondary | ICD-10-CM | POA: Diagnosis not present

## 2014-02-03 DIAGNOSIS — Z885 Allergy status to narcotic agent status: Secondary | ICD-10-CM | POA: Diagnosis not present

## 2014-02-03 DIAGNOSIS — D696 Thrombocytopenia, unspecified: Secondary | ICD-10-CM | POA: Diagnosis not present

## 2014-02-03 DIAGNOSIS — Z95820 Peripheral vascular angioplasty status with implants and grafts: Secondary | ICD-10-CM | POA: Insufficient documentation

## 2014-02-03 DIAGNOSIS — M19022 Primary osteoarthritis, left elbow: Secondary | ICD-10-CM | POA: Diagnosis not present

## 2014-02-03 DIAGNOSIS — C9110 Chronic lymphocytic leukemia of B-cell type not having achieved remission: Secondary | ICD-10-CM

## 2014-02-03 DIAGNOSIS — K219 Gastro-esophageal reflux disease without esophagitis: Secondary | ICD-10-CM | POA: Insufficient documentation

## 2014-02-03 DIAGNOSIS — E119 Type 2 diabetes mellitus without complications: Secondary | ICD-10-CM | POA: Diagnosis not present

## 2014-02-03 DIAGNOSIS — I713 Abdominal aortic aneurysm, ruptured: Secondary | ICD-10-CM | POA: Diagnosis not present

## 2014-02-03 DIAGNOSIS — I739 Peripheral vascular disease, unspecified: Secondary | ICD-10-CM | POA: Diagnosis not present

## 2014-02-03 HISTORY — DX: Gastro-esophageal reflux disease without esophagitis: K21.9

## 2014-02-03 LAB — URINALYSIS, ROUTINE W REFLEX MICROSCOPIC
Bilirubin Urine: NEGATIVE
Glucose, UA: NEGATIVE mg/dL
HGB URINE DIPSTICK: NEGATIVE
KETONES UR: NEGATIVE mg/dL
Leukocytes, UA: NEGATIVE
Nitrite: NEGATIVE
PROTEIN: NEGATIVE mg/dL
Specific Gravity, Urine: 1.006 (ref 1.005–1.030)
UROBILINOGEN UA: 0.2 mg/dL (ref 0.0–1.0)
pH: 6 (ref 5.0–8.0)

## 2014-02-03 LAB — COMPREHENSIVE METABOLIC PANEL
ALBUMIN: 4 g/dL (ref 3.5–5.2)
ALT: 22 U/L (ref 0–53)
AST: 16 U/L (ref 0–37)
Alkaline Phosphatase: 94 U/L (ref 39–117)
Anion gap: 13 (ref 5–15)
BUN: 9 mg/dL (ref 6–23)
CO2: 24 meq/L (ref 19–32)
CREATININE: 0.8 mg/dL (ref 0.50–1.35)
Calcium: 9.8 mg/dL (ref 8.4–10.5)
Chloride: 101 mEq/L (ref 96–112)
GFR calc Af Amer: >90 mL/min (ref >90)
Glucose, Bld: 191 mg/dL — ABNORMAL HIGH (ref 70–99)
Potassium: 4.3 mEq/L (ref 3.7–5.3)
Sodium: 138 mEq/L (ref 137–147)
Total Bilirubin: 0.5 mg/dL (ref 0.3–1.2)
Total Protein: 8 g/dL (ref 6.0–8.3)

## 2014-02-03 LAB — APTT: aPTT: 28 seconds (ref 24–37)

## 2014-02-03 LAB — PROTIME-INR
INR: 1 (ref 0.00–1.49)
PROTHROMBIN TIME: 13.3 s (ref 11.6–15.2)

## 2014-02-03 LAB — ABO/RH: ABO/RH(D): O POS

## 2014-02-03 MED ORDER — ACYCLOVIR 400 MG PO TABS: 400.0000 mg | ORAL_TABLET | Freq: Every day | ORAL | Status: DC

## 2014-02-03 NOTE — Pre-Procedure Instructions (Signed)
Ryan Copeland  02/03/2014   Your procedure is scheduled on: Wednesday February 12, 2014 at 9:30 AM.  Report to [facility] Admitting at 7:00 AM.  Call this number if you have problems the morning of surgery: [phone]   For any other questions Monday-Friday from 8am-4pm call: [phone]   Remember:   Do not eat food or drink liquids after midnight.   Take these medicines the morning of surgery with A SIP OF WATER: Acyclovir (Zovirax), Alprazolam (Xanax) if needed, Esomeprazole (Nexium), Hydrocodone if needed, Metoprolol (Toprol), and Topiramate (Topamax)    Do not wear jewelry.  Do not wear lotions, powders, or cologne.   Men may shave face and neck.  Do not bring valuables to the hospital.  [facility] is not responsible for any belongings or valuables.               Contacts, dentures or bridgework may not be worn into surgery.  Leave suitcase in the car. After surgery it may be brought to your room.  For patients admitted to the hospital, discharge time is determined by your treatment team.               Patients discharged the day of surgery will not be allowed to drive home.  Name and phone number of your driver:   Special Instructions: Shower using CHG soap the night before and the morning of your surgery   Please read over the following fact sheets that you were given: Pain Booklet, Coughing and Deep Breathing, MRSA Information and Surgical Site Infection Prevention

## 2014-02-03 NOTE — Progress Notes (Signed)
PCP is Bing Matter. Oncologist is [employer] and patient sees Truitt Merle, NP for Cardiology. Patient informed Nurse that he is scheduled for a office visit with Cardiology tomorrow. Patient denied having any acute cardiac or pulmonary issues.

## 2014-02-03 NOTE — Progress Notes (Signed)
Patient requested that Nurse call Dr. Archie Endo office and verify time of surgery as he was told that he surgery was not going to be until 1300 on 02/12/14. Nurse called Dr. Archie Endo office and spoke with Judeen Hammans about surgery time, and she stated that Dr. Noemi Chapel had to move some things around and that his surgery time is now going to be at 0930. Nurse informed patient of this. Patient verbalized understanding. Patient instructed to arrive to short stay at 0700 instead of 0730 since he needed to have 1 unit of platelets given before surgery. Patient verbalized understanding.

## 2014-02-03 NOTE — Progress Notes (Signed)
Patient had a CBC w/diff done on 01/30/14, therefore Nurse will not repeat CBC w/diff on today 02/03/14 (being that it was 4 days ago). Patient informed of this and Nurse told him that a CBC may be obtained DOS to recheck platelets. Patient verbalized understanding.

## 2014-02-04 ENCOUNTER — Ambulatory Visit (INDEPENDENT_AMBULATORY_CARE_PROVIDER_SITE_OTHER): Payer: BC Managed Care – PPO | Admitting: Nurse Practitioner

## 2014-02-04 ENCOUNTER — Encounter: Payer: Self-pay | Admitting: Nurse Practitioner

## 2014-02-04 VITALS — BP 100/60 | HR 54 | Ht 72.0 in | Wt 215.1 lb

## 2014-02-04 DIAGNOSIS — I10 Essential (primary) hypertension: Secondary | ICD-10-CM

## 2014-02-04 DIAGNOSIS — E119 Type 2 diabetes mellitus without complications: Secondary | ICD-10-CM

## 2014-02-04 DIAGNOSIS — E785 Hyperlipidemia, unspecified: Secondary | ICD-10-CM

## 2014-02-04 DIAGNOSIS — I2510 Atherosclerotic heart disease of native coronary artery without angina pectoris: Secondary | ICD-10-CM

## 2014-02-04 DIAGNOSIS — Z01818 Encounter for other preprocedural examination: Secondary | ICD-10-CM

## 2014-02-04 LAB — URINE CULTURE
Colony Count: NO GROWTH
Culture: NO GROWTH

## 2014-02-04 LAB — LIPID PANEL
Cholesterol: 153 mg/dL (ref 0–200)
HDL: 26.2 mg/dL — ABNORMAL LOW (ref >39.00)
LDL Cholesterol: 89 mg/dL (ref 0–99)
NonHDL: 126.8
Total CHOL/HDL Ratio: 6
Triglycerides: 189 mg/dL — ABNORMAL HIGH (ref 0.0–149.0)
VLDL: 37.8 mg/dL (ref 0.0–40.0)

## 2014-02-04 MED ORDER — METOPROLOL SUCCINATE ER 50 MG PO TB24: 50.0000 mg | ORAL_TABLET | Freq: Every day | ORAL | Status: DC

## 2014-02-04 MED ORDER — SILDENAFIL CITRATE 100 MG PO TABS: 100.0000 mg | ORAL_TABLET | ORAL | Status: DC | PRN

## 2014-02-04 MED ORDER — ROSUVASTATIN CALCIUM 5 MG PO TABS: 5.0000 mg | ORAL_TABLET | Freq: Every day | ORAL | Status: DC

## 2014-02-04 NOTE — Progress Notes (Signed)
Anesthesia Chart Review:  Patient is a 62 year old male scheduled for left elbow debridement with tendon repair and tendotomy on 02/12/14 by Dr. Noemi Chapel.    History includes smoking, CAD/NSTEMI 06/2009 s/p LAD stent with known RCA occlusion with collaterals by 2013 cath, HLD, PVD s/p left CIA stent/PTA and popliteal/tibial artery thrombectomy '10, GERD, DM2 with peripheral neuropathy, CLL, thrombocytopenia (due to ITP and receiving IVIG per ONC notes), anxiety. PCP is Bing Matter, PA-C. Cardiologist Dr. Acie Fredrickson. He had a cardiology visit with Truitt Merle, NP this morning and was felt to be a "satisfactory candidate."   Hem-Onc is Dr. Alvy Bimler. According to her 01/30/14 note: "The patient desire orthopedic surgery to his left elbow as soon as possible before the new year. After speaking with his surgeon, his surgeon one to his platelet count closer to 100,000 if possible. Repeat CBC today showed platelet count of 78,000. The patient is not symptomatic. There is no contraindication for him to proceed with orthopedic surgery as well as his blood count is above 50,000.  Since his orthopedic surgeon desired a higher platelet count, I recommend prophylactic platelet transfusion 1-2 hours before surgery. I gave him a letter of clearance from the hematology standpoint and will fax a copy to his office." Letter is also under [facility] tab. He will need aphresis platelet transfusion.  05/03/13 EKG: SB at 53 bpm.  Cardiac cath 08/26/11:  1. Single vessel obstructive coronary disease. 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 The stent in the proximal LAD is widely patent.  2. Normal LV function. EF 55-65%. Recommendations: Continue medical management.   12/23/13 carotid duplex: < 40% bilateral ICA stenosis.  Labs from 01/30/14 and 02/03/14 noted.  WBC 5.1 PLT count 78K on 01/30/14 (already reviewed by hematology with recommendation for PLT infusion preoperratively).  He is scheduled to arrive 2 1/2 hours prior to his scheduled surgery. Urine culture is still pending.  Cardiology and hematology are on board with surgery plans.  If no acute changes then I would anticipate that he could proceed as planned.  Myra Gianotti, PA-C [facility] Stay [facility]/Anesthesiology Phone [phone] 02/04/2014 1:20 PM

## 2014-02-04 NOTE — Patient Instructions (Addendum)
We will be checking the following labs today - lipids  Try to keep working on stopping smoking  Stay on your current medicines  I refilled the Crestor, Metoprolol and Viagra today  Good luck with your surgery  I will see you in 6 months with fasting labs  Call the [place] office at [phone] if you have any questions, problems or concerns.

## 2014-02-04 NOTE — Progress Notes (Addendum)
Ryan Copeland Date of Birth: [birth year] Medical Record #[id number]  History of Present Illness: Ryan Copeland is seen back today for a follow up visit. This is a 7 month check. Seen for Dr. Acie Fredrickson. Former patient of Dr. Susa Simmonds. Has known prior NSTEMI and stenting of the LAD in 2011. Underwent repeat cath in June of 2013 - stent was patent - has known totally occluded RCA that fills by collaterals - managed medically. Other issues include tobacco abuse, PVD with past iliac stenting, HTN, HLD, DM and CLL .  Last seen here in March of 2015. Was doing ok. Has tried to stop smoking with Chantix and classes. Stable cardiac status.   Comes back today. Here alone. Planning on having elbow surgery at the end of the month with Dr. Noemi Chapel. Already cleared for this. No chest pain. Not short of breath. Already off his antiplatelet earlier this year due to having some oral surgery.  Last stent in 2011. Remains on aspirin. Fasting today - has had all of his labs but no lipids. Needs medicines refilled. He continues to use nicotine patches and has a goal of stopping his smoking by the end of the year.   Current Outpatient Prescriptions  Medication Sig Dispense Refill  . acyclovir (ZOVIRAX) 400 MG tablet Take 1 tablet (400 mg total) by mouth daily. 90 tablet 4  . ALPRAZolam (XANAX) 0.25 MG tablet Take 1 po q 6 hours prn for anxiety 60 tablet 0  . aspirin 325 MG tablet Take 325 mg by mouth daily.      . COMBIVENT RESPIMAT 20-100 MCG/ACT AERS respimat 1 puff every 6 (six) hours as needed.   0  . cyanocobalamin (,VITAMIN B-12,) 1000 MCG/ML injection Inject 1 mL (1,000 mcg total) into the muscle every 30 (thirty) days. 3 mL 1  . esomeprazole (NEXIUM) 40 MG capsule Take 1 capsule (40 mg total) by mouth 2 (two) times daily. 180 capsule 3  . FLOVENT HFA 110 MCG/ACT inhaler Inhale 1 puff into the lungs as needed.   1  . HYDROcodone-acetaminophen (NORCO/VICODIN) 5-325 MG per tablet Take 1 tablet by mouth every 6 (six)  hours as needed.   0  . lidocaine-prilocaine (EMLA) cream Apply 1 application topically as needed.   0  . metFORMIN (GLUCOPHAGE-XR) 500 MG 24 hr tablet Take 500 mg by mouth 2 (two) times daily.    . metoprolol succinate (TOPROL-XL) 50 MG 24 hr tablet Take 1 tablet (50 mg total) by mouth daily with breakfast. Take with or immediately following a meal. 90 tablet 3  . nicotine (NICODERM CQ - DOSED IN MG/24 HOURS) 14 mg/24hr patch Place 1 patch (14 mg total) onto the skin daily. 28 patch 0  . nicotine (NICODERM CQ - DOSED IN MG/24 HOURS) 21 mg/24hr patch Place 1 patch (21 mg total) onto the skin daily. 28 patch 0  . nicotine (NICODERM CQ - DOSED IN MG/24 HR) 7 mg/24hr patch Place 1 patch (7 mg total) onto the skin daily. 28 patch 0  . nitroGLYCERIN (NITROSTAT) 0.4 MG SL tablet Place 1 tablet (0.4 mg total) under the tongue every 5 (five) minutes as needed for chest pain. 90 tablet 3  . pregabalin (LYRICA) 75 MG capsule 1 cap three times daily and 2 caps at bedtime 450 capsule 1  . rosuvastatin (CRESTOR) 5 MG tablet Take 1 tablet (5 mg total) by mouth daily. 90 tablet 3  . tetrahydrozoline 0.05 % ophthalmic solution Place 1 drop into both eyes as needed.  Red eyes    . topiramate (TOPAMAX) 25 MG tablet Take 1 tablet (25 mg total) by mouth 2 (two) times daily. 180 tablet 3  . VIAGRA 100 MG tablet Take 100 mg by mouth as needed.   0  . zolpidem (AMBIEN) 10 MG tablet Take 10 mg by mouth at bedtime as needed for sleep.   0   No current facility-administered medications for this visit.   Facility-Administered Medications Ordered in Other Visits  Medication Dose Route Frequency Provider Last Rate Last Dose  . sodium chloride 0.9 % injection 10 mL  10 mL Intracatheter PRN Deatra Robinson, MD   10 mL at 08/22/12 1721  . sodium chloride 0.9 % injection 10 mL  10 mL Intravenous PRN Deatra Robinson, MD   10 mL at 05/29/13 1323    Allergies  Allergen Reactions  . Codeine Hives    Pt states he can take a few,  more reaction with extended doses.    Past Medical History  Diagnosis Date  . 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 Hyperlipidemia   . Tobacco abuse   . PVD (peripheral vascular disease)   . CAD (coronary artery disease)   . Anxiety 01/30/2014  . Diabetes mellitus     Type 2   . GERD (gastroesophageal reflux disease)     takes Nexium if needed  . Neuromuscular disorder     peripheral neuropathy  . CLL (chronic lymphocytic leukemia) 03/18/2011    Past Surgical History  Procedure Laterality Date  . Femoral stents    . Coronary stent placement  May 2011  . Cholecystectomy  2007  . Carpel tunnel release Left [id number]  . Carpel tunnel release  Right 01-1989  . Tarsal tunnel release Bilateral 08-2007  . Left cai stent/pta and popliteal artery/tibial thrombectomy     . Left heart catheterization with coronary angiogram N/A 08/26/2011    Procedure: LEFT HEART CATHETERIZATION WITH CORONARY ANGIOGRAM;  Surgeon: Peter M Martinique, MD;  Location: [facility] CATH LAB;  Service: Cardiovascular;  Laterality: N/A;  . Cardiac catheterization    . Adenoidectomy  1955    History  Smoking status  . Current Every Day Smoker -- 0.50 packs/day for 44 years  . Types: Cigarettes  Smokeless tobacco  . Former User    History  Alcohol Use No    Comment:  drinks non-alcoholic beer    Family History  Problem Relation Age of Onset  . Lung cancer Mother 11  . Cancer Mother     lung  . Heart failure Father 71  . Heart disease Father     Review of Systems: The review of systems is per the HPI.  All other systems were reviewed and are negative.  Physical Exam: BP 100/60 mmHg  Pulse 54  Ht 6' (1.829 m)  Wt 215 lb 1.9 oz (97.578 kg)  BMI 29.17 kg/m2 Patient is very pleasant and in no acute distress. Skin is warm and dry. Color is normal.  HEENT is unremarkable. Normocephalic/atraumatic. PERRL. Sclera are nonicteric. Neck is supple. No masses. No JVD. Lungs are  coarse. Cardiac exam shows a regular rate and rhythm. Abdomen is soft. Extremities are without edema. Gait and ROM are intact. No gross neurologic deficits noted.  Wt Readings from Last 3 Encounters:  02/04/14 215 lb 1.9 oz (97.578 kg)  02/03/14 215 lb 6.2 oz (97.7 kg)  01/30/14 218 lb 3.2 oz (98.975 kg)  LABORATORY DATA/PROCEDURES:  Lab Results  Component Value Date   WBC 5.1 01/30/2014   HGB 15.5 01/30/2014   HCT 43.2 01/30/2014   PLT 78* 01/30/2014   GLUCOSE 191* 02/03/2014   CHOL 147 05/03/2013   TRIG 187.0* 05/03/2013   HDL 37.50* 05/03/2013   LDLDIRECT 117.6 08/22/2011   LDLCALC 72 05/03/2013   ALT 22 02/03/2014   AST 16 02/03/2014   NA 138 02/03/2014   K 4.3 02/03/2014   CL 101 02/03/2014   CREATININE 0.80 02/03/2014   BUN 9 02/03/2014   CO2 24 02/03/2014   INR 1.00 02/03/2014   HGBA1C 7.5* 07/26/2013    BNP (last 3 results) No results for input(s): PROBNP in the last 8760 hours.   Assessment / Plan:  Coronary angiography:  Coronary dominance: right  Left mainstem: Normal.  Left anterior descending (LAD): The stent in the proximal LAD is widely patent throughout. There is moderate 20% narrowing in the LAD proximal to the stent. The remainder of the vessels without significant disease.  Left circumflex (LCx): The left circumflex gives rise to 2 large marginal branches. There is 20% narrowing prior to the takeoff of the first OM.  Right coronary artery (RCA): The right coronary is a dominant vessel. It has diffuse 70% disease in the proximal to mid vessel. Is occluded at the crux. There are excellent right to right and left to right collaterals.  Left ventriculography: Left ventricular systolic function is normal, LVEF is estimated at 55-65%, there is no significant mitral regurgitation  Final Conclusions:  1. Single vessel obstructive coronary disease. 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 The stent in the  proximal LAD is widely patent.  2. Normal LV function.  Recommendations: Continue medical management.  Peter Martinique  08/26/2011, 9:09 AM   Assessment / Plan: 1. CAD - no symptoms reported. Continue with current medical management. Ok to decrease his aspirin to 81 mg a day. See back in 6 months.  2. HTN - BP is great - no change in therapy  3. HLD - recheck lipids today - on statin therapy   4. ED - uses Viagra  5. Tobacco abuse - has plan in place to hopefully stop  6. Pre op clearance - should be a satisfactory candidate. No longer on his Effient but remains on his aspirin.   I will see him back in 6 months. No change in his current regimen. Refilled his medicines today. Check lipids today as well.   Patient is agreeable to this plan and will call if any problems develop in the interim.   Burtis Junes, RN, [place] [address] [place], [place]  [phone]  Addendum:  EKG today with sinus bradycardia.

## 2014-02-10 ENCOUNTER — Encounter (HOSPITAL_COMMUNITY): Payer: Self-pay | Admitting: Physician Assistant

## 2014-02-10 ENCOUNTER — Other Ambulatory Visit: Payer: BC Managed Care – PPO

## 2014-02-10 ENCOUNTER — Ambulatory Visit: Payer: BC Managed Care – PPO | Admitting: Hematology and Oncology

## 2014-02-10 DIAGNOSIS — M659 Synovitis and tenosynovitis, unspecified: Secondary | ICD-10-CM | POA: Diagnosis present

## 2014-02-10 DIAGNOSIS — M7712 Lateral epicondylitis, left elbow: Secondary | ICD-10-CM | POA: Diagnosis present

## 2014-02-10 NOTE — H&P (Signed)
Ryan Copeland is an 63 y.o. male.   Chief Complaint: left elbow pain and weakness HPI: Ryan Copeland is a 63 year old seen for follow-up from his left elbow lateral epicondylitis versus tear. We injected his left elbow almost a month ago with minimal relief. He has been using a forearm strap but despite this continues to have pain.  Past Medical History  Diagnosis Date  . 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 Hyperlipidemia   . Tobacco abuse   . PVD (peripheral vascular disease)   . CAD (coronary artery disease)   . Anxiety 01/30/2014  . Diabetes mellitus     Type 2   . GERD (gastroesophageal reflux disease)     takes Nexium if needed  . Neuromuscular disorder     peripheral neuropathy  . 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 ECRB (extensor carpi radialis brevis) tenosynovitis   . Lateral epicondylitis of left elbow     Past Surgical History  Procedure Laterality Date  . Femoral stents    . Coronary stent placement  May 2011  . Cholecystectomy  2007  . Carpel tunnel release Left [id number]  . Carpel tunnel release  Right 01-1989  . Tarsal tunnel release Bilateral 08-2007  . Left cai stent/pta and popliteal artery/tibial thrombectomy     . Left heart catheterization with coronary angiogram N/A 08/26/2011    Procedure: LEFT HEART CATHETERIZATION WITH CORONARY ANGIOGRAM;  Surgeon: Peter M Martinique, MD;  Location: [facility] CATH LAB;  Service: Cardiovascular;  Laterality: N/A;  . Cardiac catheterization    . Adenoidectomy  1955    Family History  Problem Relation Age of Onset  . Lung cancer Mother 23  . Cancer Mother     lung  . Heart failure Father 94  . Heart disease Father    Social History:  reports that he has been smoking Cigarettes.  He has a 22 pack-year smoking history. He has quit using smokeless tobacco. He reports that he does not drink alcohol or use illicit drugs.  Allergies:  Allergies  Allergen Reactions  . Codeine Hives     Pt states he can take a few, more reaction with extended doses.    No prescriptions prior to admission    No results found for this or any previous visit (from the past 48 hour(s)). No results found.  Review of Systems  Constitutional: Negative.   HENT: Negative.   Eyes: Negative.   Respiratory: Negative.   Cardiovascular: Negative.   Gastrointestinal: Negative.   Genitourinary: Negative.   Musculoskeletal: Positive for joint pain.       Left elbow pain and weakness  Skin: Negative.   Neurological: Negative.   Endo/Heme/Allergies: Bruises/bleeds easily.  Psychiatric/Behavioral: Negative.     There were no vitals taken for this visit. Physical Exam  Constitutional: He appears well-developed and well-nourished.  HENT:  Head: Normocephalic and atraumatic.  Eyes: Conjunctivae and EOM are normal. Pupils are equal, round, and reactive to light.  Neck: Neck supple.  Cardiovascular: Normal rate.   Respiratory: Effort normal.  GI: Soft.  Genitourinary:  Not pertinent to current symptomatology therefore not examined.  Musculoskeletal:  Examination of his left elbow reveals pain on the lateral epicondyle pain on gripping and extensor tendon stressing, full range of motion elbow is stable. Exam of the right elbow reveals full range of motion without pain swelling weakness or instability. Vascular exam: pulses 2+ and symmetric.  Neurological: He is alert.  Skin: Skin is warm and dry.  Psychiatric: He has a normal mood and affect.     Assessment Principal Problem:   Lateral epicondylitis of left elbow Active Problems:   Hyperlipidemia   Tobacco abuse   PVD (peripheral vascular disease)   CLL (chronic lymphocytic leukemia)   CAD (coronary artery disease)   DM (diabetes mellitus) type II controlled peripheral vascular disorder   Anxiety   Thrombocytopenia   ECRB (extensor carpi radialis brevis) tenosynovitis   Plan I spoke to Ryan Copeland concerning his left elbow MRI  that revealed a significant partial tear of the lateral epicondylar tendon as well as chondromalacia of the capitellum. I told him with these findings and significant persistent pain I recommend we proceed with left elbow lateral release debridement and repair as well as an intraarticular cortisone injection. This diagnosis is acute non-traumatic left elbow lateral epicondylitis and chondromalacia. Discussed risks benefits and possible complications of surgery and he understands this completely.   Amyri Frenz A. Kaleen Mask Physician Assistant [facility] [phone]  02/10/2014, 6:15 PM  Linda Hedges 02/10/2014, 6:11 PM

## 2014-02-11 ENCOUNTER — Telehealth: Payer: Self-pay | Admitting: *Deleted

## 2014-02-11 MED ORDER — LACTATED RINGERS IV SOLN: INTRAVENOUS | Status: DC

## 2014-02-11 MED ORDER — SODIUM CHLORIDE 0.9 % IV SOLN
Freq: Once | INTRAVENOUS | Status: AC
  Administered 2014-02-12: 08:00:00 via INTRAVENOUS

## 2014-02-11 MED ORDER — CEFAZOLIN SODIUM-DEXTROSE 2-3 GM-% IV SOLR
2.0000 g | INTRAVENOUS | Status: AC
  Administered 2014-02-12: 2 g via INTRAVENOUS

## 2014-02-11 MED ORDER — CHLORHEXIDINE GLUCONATE 4 % EX LIQD
60.0000 mL | Freq: Once | CUTANEOUS | Status: DC
  Filled 2014-02-11: qty 60

## 2014-02-11 MED ORDER — POVIDONE-IODINE 7.5 % EX SOLN
Freq: Once | CUTANEOUS | Status: DC
  Filled 2014-02-11: qty 118

## 2014-02-11 NOTE — Telephone Encounter (Signed)
Patient is requesting a refill on his Effient, but it was discontinued 06/26/2013. Does he need to be back on this medication?

## 2014-02-11 NOTE — Telephone Encounter (Signed)
This was previously stopped by prior phone message.   Do not see a reason to restart.

## 2014-02-12 ENCOUNTER — Ambulatory Visit (HOSPITAL_COMMUNITY): Payer: BC Managed Care – PPO | Admitting: Certified Registered"

## 2014-02-12 ENCOUNTER — Ambulatory Visit (HOSPITAL_COMMUNITY): Payer: BC Managed Care – PPO | Admitting: Vascular Surgery

## 2014-02-12 ENCOUNTER — Encounter (HOSPITAL_COMMUNITY): Payer: Self-pay | Admitting: *Deleted

## 2014-02-12 ENCOUNTER — Ambulatory Visit (HOSPITAL_COMMUNITY)
Admission: RE | Admit: 2014-02-12 | Discharge: 2014-02-12 | Disposition: A | Discharge status: Home / Self Care | Payer: BC Managed Care – PPO | Source: Ambulatory Visit | Attending: Orthopedic Surgery | Admitting: Orthopedic Surgery

## 2014-02-12 ENCOUNTER — Encounter (HOSPITAL_COMMUNITY)
Admission: RE | Disposition: A | Discharge status: Home / Self Care | Payer: Self-pay | Source: Ambulatory Visit | Attending: Orthopedic Surgery

## 2014-02-12 DIAGNOSIS — I2510 Atherosclerotic heart disease of native coronary artery without angina pectoris: Secondary | ICD-10-CM | POA: Insufficient documentation

## 2014-02-12 DIAGNOSIS — M66222 Spontaneous rupture of extensor tendons, left upper arm: Secondary | ICD-10-CM | POA: Insufficient documentation

## 2014-02-12 DIAGNOSIS — K219 Gastro-esophageal reflux disease without esophagitis: Secondary | ICD-10-CM | POA: Insufficient documentation

## 2014-02-12 DIAGNOSIS — I739 Peripheral vascular disease, unspecified: Secondary | ICD-10-CM | POA: Insufficient documentation

## 2014-02-12 DIAGNOSIS — F419 Anxiety disorder, unspecified: Secondary | ICD-10-CM | POA: Diagnosis present

## 2014-02-12 DIAGNOSIS — M19022 Primary osteoarthritis, left elbow: Secondary | ICD-10-CM | POA: Insufficient documentation

## 2014-02-12 DIAGNOSIS — E785 Hyperlipidemia, unspecified: Secondary | ICD-10-CM | POA: Diagnosis present

## 2014-02-12 DIAGNOSIS — D696 Thrombocytopenia, unspecified: Secondary | ICD-10-CM | POA: Insufficient documentation

## 2014-02-12 DIAGNOSIS — M7712 Lateral epicondylitis, left elbow: Secondary | ICD-10-CM | POA: Diagnosis not present

## 2014-02-12 DIAGNOSIS — F17210 Nicotine dependence, cigarettes, uncomplicated: Secondary | ICD-10-CM | POA: Insufficient documentation

## 2014-02-12 DIAGNOSIS — Z885 Allergy status to narcotic agent status: Secondary | ICD-10-CM | POA: Insufficient documentation

## 2014-02-12 DIAGNOSIS — E119 Type 2 diabetes mellitus without complications: Secondary | ICD-10-CM | POA: Insufficient documentation

## 2014-02-12 DIAGNOSIS — Z720 Tobacco use: Secondary | ICD-10-CM | POA: Diagnosis present

## 2014-02-12 DIAGNOSIS — C9110 Chronic lymphocytic leukemia of B-cell type not having achieved remission: Secondary | ICD-10-CM | POA: Insufficient documentation

## 2014-02-12 DIAGNOSIS — M659 Synovitis and tenosynovitis, unspecified: Secondary | ICD-10-CM | POA: Diagnosis present

## 2014-02-12 DIAGNOSIS — M94222 Chondromalacia, left elbow: Secondary | ICD-10-CM | POA: Insufficient documentation

## 2014-02-12 DIAGNOSIS — I252 Old myocardial infarction: Secondary | ICD-10-CM | POA: Insufficient documentation

## 2014-02-12 DIAGNOSIS — C8308 Small cell B-cell lymphoma, lymph nodes of multiple sites: Secondary | ICD-10-CM | POA: Diagnosis present

## 2014-02-12 HISTORY — DX: Lateral epicondylitis, left elbow: M77.12

## 2014-02-12 HISTORY — DX: Unspecified synovitis and tenosynovitis, unspecified forearm: M65.939

## 2014-02-12 HISTORY — DX: Synovitis and tenosynovitis, unspecified: M65.9

## 2014-02-12 HISTORY — PX: LATERAL EPICONDYLE RELEASE: SHX1958

## 2014-02-12 LAB — CBC WITH DIFFERENTIAL/PLATELET
Basophils Absolute: 0 10*3/uL (ref 0.0–0.1)
Basophils Relative: 0 % (ref 0–1)
EOS ABS: 0 10*3/uL (ref 0.0–0.7)
EOS PCT: 1 % (ref 0–5)
HCT: 42.8 % (ref 39.0–52.0)
HEMOGLOBIN: 15.5 g/dL (ref 13.0–17.0)
LYMPHS ABS: 0.5 10*3/uL — AB (ref 0.7–4.0)
LYMPHS PCT: 10 % — AB (ref 12–46)
MCH: 33.6 pg (ref 26.0–34.0)
MCHC: 36.2 g/dL — ABNORMAL HIGH (ref 30.0–36.0)
MCV: 92.8 fL (ref 78.0–100.0)
MONOS PCT: 5 % (ref 3–12)
Monocytes Absolute: 0.3 10*3/uL (ref 0.1–1.0)
Neutro Abs: 4.1 10*3/uL (ref 1.7–7.7)
Neutrophils Relative %: 84 % — ABNORMAL HIGH (ref 43–77)
Platelets: 84 10*3/uL — ABNORMAL LOW (ref 150–400)
RBC: 4.61 MIL/uL (ref 4.22–5.81)
RDW: 13.9 % (ref 11.5–15.5)
WBC: 4.9 10*3/uL (ref 4.0–10.5)

## 2014-02-12 LAB — TYPE AND SCREEN
ABO/RH(D): O POS
Antibody Screen: NEGATIVE

## 2014-02-12 LAB — GLUCOSE, CAPILLARY: Glucose-Capillary: 148 mg/dL — ABNORMAL HIGH (ref 70–99)

## 2014-02-12 SURGERY — TENNIS ELBOW RELEASE/NIRSCHEL PROCEDURE
Anesthesia: General | Site: Elbow | Laterality: Left

## 2014-02-12 MED ORDER — EPHEDRINE SULFATE 50 MG/ML IJ SOLN
INTRAMUSCULAR | Status: DC | PRN
  Administered 2014-02-12: 10 mg via INTRAVENOUS

## 2014-02-12 MED ORDER — LIDOCAINE HCL (PF) 1 % IJ SOLN
INTRAMUSCULAR | Status: DC | PRN
  Administered 2014-02-12: 30 mL

## 2014-02-12 MED ORDER — ONDANSETRON HCL 4 MG/2ML IJ SOLN: 4.0000 mg | Freq: Once | INTRAMUSCULAR | Status: DC | PRN

## 2014-02-12 MED ORDER — PROPOFOL 10 MG/ML IV BOLUS
INTRAVENOUS | Status: DC | PRN
Start: 2014-02-12 — End: 2014-02-12
  Administered 2014-02-12: 180 mg via INTRAVENOUS

## 2014-02-12 MED ORDER — LACTATED RINGERS IV SOLN: INTRAVENOUS | Status: DC | PRN

## 2014-02-12 MED ORDER — LIDOCAINE HCL (CARDIAC) 20 MG/ML IV SOLN
INTRAVENOUS | Status: DC | PRN
  Administered 2014-02-12: 60 mg via INTRAVENOUS

## 2014-02-12 MED ORDER — OXYCODONE HCL 5 MG PO TABS: ORAL_TABLET | ORAL | Status: DC

## 2014-02-12 MED ORDER — FENTANYL CITRATE 0.05 MG/ML IJ SOLN
INTRAMUSCULAR | Status: AC
  Filled 2014-02-12: qty 2

## 2014-02-12 MED ORDER — MIDAZOLAM HCL 5 MG/5ML IJ SOLN
INTRAMUSCULAR | Status: DC | PRN
  Administered 2014-02-12: 2 mg via INTRAVENOUS

## 2014-02-12 MED ORDER — ONDANSETRON HCL 4 MG/2ML IJ SOLN
INTRAMUSCULAR | Status: DC | PRN
  Administered 2014-02-12: 4 mg via INTRAVENOUS

## 2014-02-12 MED ORDER — BUPIVACAINE HCL (PF) 0.25 % IJ SOLN
INTRAMUSCULAR | Status: AC
  Filled 2014-02-12: qty 30

## 2014-02-12 MED ORDER — FENTANYL CITRATE 0.05 MG/ML IJ SOLN
INTRAMUSCULAR | Status: AC
  Filled 2014-02-12: qty 5

## 2014-02-12 MED ORDER — PHENYLEPHRINE HCL 10 MG/ML IJ SOLN
INTRAMUSCULAR | Status: DC | PRN
  Administered 2014-02-12 (×4): 40 ug via INTRAVENOUS

## 2014-02-12 MED ORDER — GLYCOPYRROLATE 0.2 MG/ML IJ SOLN
INTRAMUSCULAR | Status: DC | PRN
  Administered 2014-02-12: 0.4 mg via INTRAVENOUS

## 2014-02-12 MED ORDER — CEFAZOLIN SODIUM-DEXTROSE 2-3 GM-% IV SOLR
INTRAVENOUS | Status: AC
  Filled 2014-02-12: qty 50

## 2014-02-12 MED ORDER — SUCCINYLCHOLINE CHLORIDE 20 MG/ML IJ SOLN
INTRAMUSCULAR | Status: AC
  Filled 2014-02-12: qty 1

## 2014-02-12 MED ORDER — ROCURONIUM BROMIDE 50 MG/5ML IV SOLN
INTRAVENOUS | Status: AC
  Filled 2014-02-12: qty 1

## 2014-02-12 MED ORDER — OXYCODONE HCL 5 MG PO TABS
ORAL_TABLET | ORAL | Status: AC
  Filled 2014-02-12: qty 1

## 2014-02-12 MED ORDER — OXYCODONE HCL 5 MG PO TABS
5.0000 mg | ORAL_TABLET | Freq: Once | ORAL | Status: AC | PRN
  Administered 2014-02-12: 5 mg via ORAL

## 2014-02-12 MED ORDER — PROPOFOL 10 MG/ML IV BOLUS
INTRAVENOUS | Status: AC
  Filled 2014-02-12: qty 20

## 2014-02-12 MED ORDER — METHYLPREDNISOLONE ACETATE 40 MG/ML IJ SUSP
40.0000 mg | INTRAMUSCULAR | Status: AC
  Administered 2014-02-12: 40 mg via INTRA_ARTICULAR
  Filled 2014-02-12: qty 1

## 2014-02-12 MED ORDER — LIDOCAINE HCL (PF) 1 % IJ SOLN
INTRAMUSCULAR | Status: AC
  Filled 2014-02-12: qty 30

## 2014-02-12 MED ORDER — SODIUM CHLORIDE 0.9 % IV SOLN
INTRAVENOUS | Status: DC | PRN
  Administered 2014-02-12: 07:00:00 via INTRAVENOUS

## 2014-02-12 MED ORDER — FENTANYL CITRATE 0.05 MG/ML IJ SOLN
25.0000 ug | INTRAMUSCULAR | Status: DC | PRN
Start: 2014-02-12 — End: 2014-02-12
  Administered 2014-02-12 (×4): 25 ug via INTRAVENOUS

## 2014-02-12 MED ORDER — FENTANYL CITRATE 0.05 MG/ML IJ SOLN
INTRAMUSCULAR | Status: DC | PRN
  Administered 2014-02-12 (×2): 50 ug via INTRAVENOUS

## 2014-02-12 MED ORDER — MIDAZOLAM HCL 2 MG/2ML IJ SOLN
INTRAMUSCULAR | Status: AC
  Filled 2014-02-12: qty 2

## 2014-02-12 MED ORDER — OXYCODONE HCL 5 MG/5ML PO SOLN: 5.0000 mg | Freq: Once | ORAL | Status: AC | PRN

## 2014-02-12 MED ORDER — BUPIVACAINE HCL (PF) 0.25 % IJ SOLN
INTRAMUSCULAR | Status: DC | PRN
  Administered 2014-02-12: 30 mL

## 2014-02-12 SURGICAL SUPPLY — 48 items
BANDAGE ELASTIC 4 VELCRO ST LF (GAUZE/BANDAGES/DRESSINGS) ×4 IMPLANT
BENZOIN TINCTURE PRP APPL 2/3 (GAUZE/BANDAGES/DRESSINGS) ×2 IMPLANT
BNDG COHESIVE 4X5 TAN STRL (GAUZE/BANDAGES/DRESSINGS) ×2 IMPLANT
BNDG ESMARK 4X9 LF (GAUZE/BANDAGES/DRESSINGS) ×2 IMPLANT
BNDG GAUZE ELAST 4 BULKY (GAUZE/BANDAGES/DRESSINGS) ×2 IMPLANT
CLOSURE STERI-STRIP 1/4X4 (GAUZE/BANDAGES/DRESSINGS) ×2 IMPLANT
CLSR STERI-STRIP ANTIMIC 1/2X4 (GAUZE/BANDAGES/DRESSINGS) ×2 IMPLANT
COVER SURGICAL LIGHT HANDLE (MISCELLANEOUS) ×2 IMPLANT
DRAPE PROXIMA HALF (DRAPES) ×2 IMPLANT
DRAPE U-SHAPE 47X51 STRL (DRAPES) ×2 IMPLANT
DRSG PAD ABDOMINAL 8X10 ST (GAUZE/BANDAGES/DRESSINGS) ×2 IMPLANT
DURAPREP 26ML APPLICATOR (WOUND CARE) ×2 IMPLANT
ELECT CAUTERY BLADE 6.4 (BLADE) ×2 IMPLANT
ELECT REM PT RETURN 9FT ADLT (ELECTROSURGICAL) ×2
ELECTRODE REM PT RTRN 9FT ADLT (ELECTROSURGICAL) ×1 IMPLANT
GAUZE SPONGE 4X4 12PLY STRL (GAUZE/BANDAGES/DRESSINGS) IMPLANT
GAUZE XEROFORM 1X8 LF (GAUZE/BANDAGES/DRESSINGS) ×2 IMPLANT
GLOVE BIO SURGEON STRL SZ7 (GLOVE) ×2 IMPLANT
GLOVE BIOGEL PI IND STRL 6.5 (GLOVE) ×1 IMPLANT
GLOVE BIOGEL PI IND STRL 7.0 (GLOVE) ×1 IMPLANT
GLOVE BIOGEL PI IND STRL 7.5 (GLOVE) ×1 IMPLANT
GLOVE BIOGEL PI INDICATOR 6.5 (GLOVE) ×1
GLOVE BIOGEL PI INDICATOR 7.0 (GLOVE) ×1
GLOVE BIOGEL PI INDICATOR 7.5 (GLOVE) ×1
GLOVE SS BIOGEL STRL SZ 7.5 (GLOVE) ×1 IMPLANT
GLOVE SS N UNI LF 6.5 STRL (GLOVE) ×2 IMPLANT
GLOVE SUPERSENSE BIOGEL SZ 7.5 (GLOVE) ×1
KIT BASIN OR (CUSTOM PROCEDURE TRAY) ×2 IMPLANT
KIT ROOM TURNOVER OR (KITS) ×2 IMPLANT
NEEDLE 18GX1X1/2 (RX/OR ONLY) (NEEDLE) ×2 IMPLANT
NS IRRIG 1000ML POUR BTL (IV SOLUTION) ×2 IMPLANT
PAD ABD 8X10 STRL (GAUZE/BANDAGES/DRESSINGS) ×2 IMPLANT
PAD ARMBOARD 7.5X6 YLW CONV (MISCELLANEOUS) ×4 IMPLANT
PAD CAST 4YDX4 CTTN HI CHSV (CAST SUPPLIES) ×1 IMPLANT
PADDING CAST COTTON 4X4 STRL (CAST SUPPLIES) ×1
SLING ARM FOAM STRAP LRG (SOFTGOODS) ×2 IMPLANT
SPONGE GAUZE 4X4 12PLY STER LF (GAUZE/BANDAGES/DRESSINGS) ×2 IMPLANT
SPONGE LAP 4X18 X RAY DECT (DISPOSABLE) ×2 IMPLANT
STOCKINETTE IMPERVIOUS 9X36 MD (GAUZE/BANDAGES/DRESSINGS) ×2 IMPLANT
SUCTION FRAZIER TIP 10 FR DISP (SUCTIONS) ×2 IMPLANT
SUT ETHILON 4 0 PS 2 18 (SUTURE) ×2 IMPLANT
SUT FIBERWIRE 2-0 18 17.9 3/8 (SUTURE) ×4
SUT PROLENE 3 0 PS 2 (SUTURE) ×2 IMPLANT
SUT VIC AB 2-0 FS1 27 (SUTURE) ×2 IMPLANT
SUTURE FIBERWR 2-0 18 17.9 3/8 (SUTURE) ×2 IMPLANT
TUBE CONNECTING 12X1/4 (SUCTIONS) ×2 IMPLANT
UNDERPAD 30X30 INCONTINENT (UNDERPADS AND DIAPERS) ×2 IMPLANT
WATER STERILE IRR 1000ML POUR (IV SOLUTION) ×2 IMPLANT

## 2014-02-12 NOTE — Anesthesia Procedure Notes (Signed)
Procedure Name: LMA Insertion Date/Time: 02/12/2014 9:35 AM Performed by: Maeola Harman Pre-anesthesia Checklist: Patient identified, Emergency Drugs available, Suction available, Patient being monitored and Timeout performed Patient Re-evaluated:Patient Re-evaluated prior to inductionOxygen Delivery Method: Circle system utilized Preoxygenation: Pre-oxygenation with 100% oxygen Intubation Type: IV induction Ventilation: Mask ventilation without difficulty LMA: LMA inserted LMA Size: 5.0 Tube type: Oral Number of attempts: 1 Placement Confirmation: positive ETCO2 and breath sounds checked- equal and bilateral Tube secured with: Tape Dental Injury: Teeth and Oropharynx as per pre-operative assessment

## 2014-02-12 NOTE — Transfer of Care (Signed)
Immediate Anesthesia Transfer of Care Note  Patient: Ryan Copeland  Procedure(s) Performed: Procedure(s): LEFT ELBOW DEBRIDEMENT WITH TENDON REPAIR  (Left)  Patient Location: PACU  Anesthesia Type:General  Level of Consciousness: awake, alert  and sedated  Airway & Oxygen Therapy: Patient connected to face mask oxygen  Post-op Assessment: Report given to PACU RN  Post vital signs: stable  Complications: No apparent anesthesia complications

## 2014-02-12 NOTE — Op Note (Signed)
NAME:  Ryan Copeland, Ryan Copeland NO.:  [id number]  MEDICAL RECORD NO.:  [id number]  LOCATION:  [facility]                         FACILITY:  [facility]  PHYSICIAN:  Marsean Elkhatib A. Noemi Chapel, M.D. DATE OF BIRTH:  1950-04-07  DATE OF PROCEDURE:  02/12/2014 DATE OF DISCHARGE:  02/12/2014                              OPERATIVE REPORT   PREOPERATIVE DIAGNOSES: 1. 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 2. Left elbow moderate primary localized osteoarthritis.  POSTOPERATIVE DIAGNOSES: 1. 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 2. Left elbow moderate primary localized osteoarthritis.Marland Kitchen  PROCEDURE: 1. Left elbow examination under anesthesia followed by lateral     epicondylar release, debridement, and repair 2. Left elbow cortisone injection.  SURGEON:  Audree Camel. Noemi Chapel, M.D.  ASSISTANT:  Kirstin Shepperson, PA-C.  ANESTHESIA:  General.  OPERATIVE TIME:  45 minutes.  COMPLICATIONS:  None.  INDICATION FOR PROCEDURE:  Ryan Copeland is a 63 year old gentleman who has had 2-3 years of increasing left elbow pain with lateral epicondylitis with partial tear.  Tearing of this documented by MRI.  He has failed conservative care and is now to undergo lateral epicondylar release, debridement and repair, and intra-articular cortisone injection secondary to chondromalacia.  This surgery was being done at the [facility] due the fact that he has thrombocytopenia and needs preoperative platelet transfusions an hour before his surgery.  DESCRIPTION OF PROCEDURE:  Ryan Copeland was brought to the operating room on February 12, 2014, placed on operative table in supine position. After being placed under general anesthesia, his left elbow was examined.  He had full range of motion, his elbow was stable on ligamentous exam.  His left arm was prepped using sterile DuraPrep and draped using sterile technique.  Time-out procedure was called  and the correct left elbow identified.  The arm was exsanguinated and tourniquet elevated to 150 mm.  Initially through a 3 cm curvilinear incision based over the lateral epicondyle, initial exposure was made.  The underlying subcutaneous tissues were incised along with skin incision.  The ECRB and ECRL tendons were carefully identified.  There was found to be partial tearing of these at the area of the lateral epicondyle.  This area was debrided back to healthy-appearing tendon.  Underneath the tendon, there was a spur noted on lateral epicondyle which was resected. The radial head capitellum joint was not entered.  A satisfactory release was carried out and then the tendons were reattached through 4 drill holes in the lateral condyle using 2-0 FiberWire suture and a mattress suture technique were reattached 2-3 mm distal to their original insertion site and under less tension with excellent stability. At this point, the elbow could be brought through a full range of motion with excellent stability on the repair.  At this point, the joint was injected with 40 mg of Depo-Medrol and 3 mL of 0.25% Marcaine.  The fascia over the repair was then closed with running 2-0 Vicryl suture, subcutaneous tissues closed with 2-0 Vicryl, subcuticular closed with 4- 0 Prolene.  The wound injected with 0.25% Marcaine.  Sterile dressings and a long-arm splint applied.  The tourniquet was released and the patient awakened and taken to recovery in stable condition.  Needle and sponge count was correct x2 at the end of the case.  FOLLOWUP CARE:  Ryan Copeland will be followed as an outpatient on OxyIR for pain.  Seen back in the office in a week for wound check and followup.     Lizandra Zakrzewski A. Noemi Chapel, M.D.     RAW/MEDQ  D:  02/12/2014  T:  02/12/2014  Job:  [id number]

## 2014-02-12 NOTE — Interval H&P Note (Signed)
History and Physical Interval Note:  02/12/2014 9:31 AM  Ryan Copeland  has presented today for surgery, with the diagnosis of lateral epicondylisis  The various methods of treatment have been discussed with the patient and family. After consideration of risks, benefits and other options for treatment, the patient has consented to  Procedure(s): LEFT ELBOW DEBRIDEMENT [place] (Left) as a surgical intervention .  The patient's history has been reviewed, patient examined, no change in status, stable for surgery.  I have reviewed the patient's chart and labs.  Questions were answered to the patient's satisfaction.     [name]

## 2014-02-12 NOTE — Discharge Instructions (Signed)

## 2014-02-12 NOTE — Anesthesia Preprocedure Evaluation (Addendum)
Anesthesia Evaluation   Patient awake    Reviewed: Allergy & Precautions, NPO status , Patient's Chart, lab work & pertinent test results, reviewed documented beta blocker date and time   Airway Mallampati: II  TM Distance: >3 FB     Dental  (+) Teeth Intact, Chipped, Dental Advisory Given   Pulmonary Current Smoker,  breath sounds clear to auscultation        Cardiovascular + CAD, + Past MI, + Cardiac Stents and + Peripheral Vascular Disease Rhythm:Regular     Neuro/Psych Anxiety  Neuromuscular disease    GI/Hepatic GERD-  Medicated and Controlled,  Endo/Other  diabetes, Well Controlled, Type 2, Oral Hypoglycemic Agents  Renal/GU      Musculoskeletal  (+) Arthritis -,   Abdominal (+)  Abdomen: soft. Bowel sounds: normal.  Peds  Hematology   Anesthesia Other Findings Pt with CLL receiving IVIG via port.  Receiving platelets in holding for count of 85,000.  HWeaver, CRNA  Reproductive/Obstetrics                         Anesthesia Physical Anesthesia Plan  ASA: III  Anesthesia Plan: General   Post-op Pain Management:    Induction: Intravenous  Airway Management Planned: LMA  Additional Equipment:   Intra-op Plan:   Post-operative Plan:   Informed Consent: I have reviewed the patients History and Physical, chart, labs and discussed the procedure including the risks, benefits and alternatives for the proposed anesthesia with the patient or authorized representative who has indicated his/her understanding and acceptance.   Dental advisory given  Plan Discussed with: CRNA and Anesthesiologist  Anesthesia Plan Comments:         Anesthesia Quick Evaluation

## 2014-02-12 NOTE — Anesthesia Postprocedure Evaluation (Signed)
  Anesthesia Post-op Note  Patient: Ryan Copeland  Procedure(s) Performed: Procedure(s): LEFT ELBOW DEBRIDEMENT WITH TENDON REPAIR  (Left)  Patient Location: PACU  Anesthesia Type:General  Level of Consciousness: awake and alert   Airway and Oxygen Therapy: Patient Spontanous Breathing  Post-op Pain: none  Post-op Assessment: Post-op Vital signs reviewed, Patient's Cardiovascular Status Stable and Respiratory Function Stable  Post-op Vital Signs: Reviewed  Filed Vitals:   02/12/14 1141  BP: 115/66  Pulse: 65  Temp: 36.1 C  Resp: 15    Complications: No apparent anesthesia complications

## 2014-02-13 LAB — PREPARE PLATELET PHERESIS: Unit division: 0

## 2014-02-13 NOTE — Telephone Encounter (Signed)
Patient states he didn't request refill, he is aware that he had been taken off of the medication and is not sure why it came up for refill. Advised him to let pharmacy know that he is no longer taking it. He agreed.

## 2014-02-16 NOTE — Discharge Summary (Signed)
Patient ID: Ryan Copeland MRN: [id number] DOB/AGE: [birth year] 64 y.o.  Admit date: 02/12/2014 Discharge date: 02/12/2014  This was an outpatient surgery.  This patient was never admitted to the hospital.  He went home after surgery from the PACU    Admission Diagnoses:  Principal Problem:   Lateral epicondylitis of left elbow Active Problems:   Hyperlipidemia   Tobacco abuse   PVD (peripheral vascular disease)   CLL (chronic lymphocytic leukemia)   CAD (coronary artery disease)   DM (diabetes mellitus) type II controlled peripheral vascular disorder   Anxiety   Thrombocytopenia   ECRB (extensor carpi radialis brevis) tenosynovitis   Discharge Diagnoses:  Same  Past Medical History  Diagnosis Date  . 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 Hyperlipidemia   . Tobacco abuse   . PVD (peripheral vascular disease)   . CAD (coronary artery disease)   . Anxiety 01/30/2014  . Diabetes mellitus     Type 2   . GERD (gastroesophageal reflux disease)     takes Nexium if needed  . Neuromuscular disorder     peripheral neuropathy  . 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 ECRB (extensor carpi radialis brevis) tenosynovitis   . Lateral epicondylitis of left elbow     Surgeries: Procedure(s): LEFT ELBOW DEBRIDEMENT WITH TENDON REPAIR  on 02/12/2014   Consultants:    Discharged Condition: Improved  Hospital Course: Ryan Copeland is an 64 y.o. male who was admitted 02/12/2014 for operative treatment ofLateral epicondylitis of left elbow. Patient has severe unremitting pain that affects sleep, daily activities, and work/hobbies. After pre-op clearance the patient was taken to the operating room on 02/12/2014 and underwent  Procedure(s): LEFT ELBOW DEBRIDEMENT WITH TENDON REPAIR .    Patient was given perioperative antibiotics:  Anti-infectives    Start     Dose/Rate Route Frequency Ordered Stop   02/12/14 0826  ceFAZolin (ANCEF)  2-3 GM-% IVPB SOLR  Status:  Discontinued    Comments:  Starleen Arms   : cabinet override      02/12/14 0826 02/12/14 1536   02/12/14 0600  ceFAZolin (ANCEF) IVPB 2 g/50 mL premix     2 g100 mL/hr over 30 Minutes Intravenous On call to O.R. 02/11/14 1348 02/12/14 0950       Patient was given sequential compression devices, early ambulation, and chemoprophylaxis to prevent DVT.  Patient benefited maximally from hospital stay and there were no complications.    Recent vital signs: No data found.    Recent laboratory studies: No results for input(s): WBC, HGB, HCT, PLT, NA, K, CL, CO2, BUN, CREATININE, GLUCOSE, INR, CALCIUM in the last 72 hours.  Invalid input(s): PT, 2   Discharge Medications:     Medication List    STOP taking these medications        HYDROcodone-acetaminophen 5-325 MG per tablet  Commonly known as:  NORCO/VICODIN     nicotine 14 mg/24hr patch  Commonly known as:  NICODERM CQ - dosed in mg/24 hours     nicotine 21 mg/24hr patch  Commonly known as:  NICODERM CQ - dosed in mg/24 hours     nicotine 7 mg/24hr patch  Commonly known as:  NICODERM CQ - dosed in mg/24 hr      TAKE these medications        acyclovir 400 MG tablet  Commonly known as:  ZOVIRAX  Take 1 tablet (400 mg total) by mouth  daily.     ALPRAZolam 0.25 MG tablet  Commonly known as:  XANAX  Take 1 po q 6 hours prn for anxiety     aspirin 325 MG tablet  Take 325 mg by mouth daily.     COMBIVENT RESPIMAT 20-100 MCG/ACT Aers respimat  Generic drug:  Ipratropium-Albuterol  1 puff every 6 (six) hours as needed.     cyanocobalamin 1000 MCG/ML injection  Commonly known as:  (VITAMIN B-12)  Inject 1 mL (1,000 mcg total) into the muscle every 30 (thirty) days.     esomeprazole 40 MG capsule  Commonly known as:  NEXIUM  Take 1 capsule (40 mg total) by mouth 2 (two) times daily.     FLOVENT HFA 110 MCG/ACT inhaler  Generic drug:  fluticasone  Inhale 1 puff into the lungs as needed.      lidocaine-prilocaine cream  Commonly known as:  EMLA  Apply 1 application topically as needed.     metFORMIN 500 MG 24 hr tablet  Commonly known as:  GLUCOPHAGE-XR  Take 500 mg by mouth 2 (two) times daily.     metoprolol succinate 50 MG 24 hr tablet  Commonly known as:  TOPROL-XL  Take 1 tablet (50 mg total) by mouth daily with breakfast. Take with or immediately following a meal.     nitroGLYCERIN 0.4 MG SL tablet  Commonly known as:  NITROSTAT  Place 1 tablet (0.4 mg total) under the tongue every 5 (five) minutes as needed for chest pain.     oxyCODONE 5 MG immediate release tablet  Commonly known as:  ROXICODONE  1-2 tablets every 4-6 hrs as needed for pain     pregabalin 75 MG capsule  Commonly known as:  LYRICA  1 cap three times daily and 2 caps at bedtime     rosuvastatin 5 MG tablet  Commonly known as:  CRESTOR  Take 1 tablet (5 mg total) by mouth daily.     sildenafil 100 MG tablet  Commonly known as:  VIAGRA  Take 1 tablet (100 mg total) by mouth as needed.     tetrahydrozoline 0.05 % ophthalmic solution  Place 1 drop into both eyes as needed. Red eyes     topiramate 25 MG tablet  Commonly known as:  TOPAMAX  Take 1 tablet (25 mg total) by mouth 2 (two) times daily.     zolpidem 10 MG tablet  Commonly known as:  AMBIEN  Take 10 mg by mouth at bedtime as needed for sleep.        Diagnostic Studies: No results found.  Disposition: 01-Home or Self Care      Discharge Instructions    CPM    Complete by:  As directed   Continuous passive motion machine (CPM):      Use the CPM from 0 to 90 for 6 hours per day.       You may break it up into 2 or 3 sessions per day.      Use CPM for 2 weeks or until you are told to stop.     Call MD / Call 911    Complete by:  As directed   If you experience chest pain or shortness of breath, CALL 911 and be transported to the hospital emergency room.  If you develope a fever above 101 F, pus (white drainage) or  increased drainage or redness at the wound, or calf pain, call your surgeon's office.     Change dressing  Complete by:  As directed   Change the dressing daily with sterile 4 x 4 inch gauze dressing and apply TED hose.  You may clean the incision with alcohol prior to redressing.     Constipation Prevention    Complete by:  As directed   Drink plenty of fluids.  Prune juice may be helpful.  You may use a stool softener, such as Colace (over the counter) 100 mg twice a day.  Use MiraLax (over the counter) for constipation as needed.     Diet - low sodium heart healthy    Complete by:  As directed      Discharge instructions    Complete by:  As directed   Elbow lateral release  Care After Refer to this sheet in the next few weeks. These discharge instructions provide you with general information on caring for yourself after you leave the hospital. Your caregiver may also give you specific instructions. Your treatment has been planned according to the most current medical practices available, but unavoidable complications sometimes occur. If you have any problems or questions after discharge, please call your caregiver. HOME INSTRUCTIONS You may resume a normal diet and activities as directed. Use sling for comfort Do NOT get cast wet.  Do NOT remove dressing until you come back to the doctor Only take over-the-counter or prescription medicines for pain, discomfort, or fever as directed by your caregiver.  Eat a well-balanced diet.  Avoid lifting or driving until you are instructed otherwise.  Make an appointment to see your caregiver for stitches (suture) or staple removal as directed.   SEEK MEDICAL CARE IF: You have swelling of your calf or leg.  You develop shortness of breath or chest pain.  You have redness, swelling, or increasing pain in the wound.  There is pus or any unusual drainage coming from the surgical site.  You notice a bad smell coming from the surgical site or dressing.   The surgical site breaks open after sutures or staples have been removed.  There is persistent bleeding from the suture or staple line.  You are getting worse or are not improving.  You have any other questions or concerns.  SEEK IMMEDIATE MEDICAL CARE IF:  You have a fever.  You develop a rash.  You have difficulty breathing.  You develop any reaction or side effects to medicines given.  Your knee motion is decreasing rather than improving.  MAKE SURE YOU:  Understand these instructions.  Will watch your condition.  Will get help right away if you are not doing well or get worse.  NO SMOKING OR NICOTINE PATCH FOR THE NEXT TWO WEEK, IT SLOWS WOUND HEALING.  YOU CAN RESUME THE NICOTINE PATCH IN TWO WEEKS.     Do not put a pillow under the knee. Place it under the heel.    Complete by:  As directed   Place yellow foam block, yellow side up under heel at all times except when in CPM or when walking.  DO NOT modify, tear, cut, or change in any way the yellow foam block.     Increase activity slowly as tolerated    Complete by:  As directed      TED hose    Complete by:  As directed   Use stockings (TED hose) for 2 weeks on both leg(s).  You may remove them at night for sleeping.           Follow-up Information    Follow  up with Lorn Junes, MD On 02/20/2014.   Specialty:  Orthopedic Surgery   Why:  appt time 10:40 am   Contact information:   [address] [place] [address] [phone]        Signed: Linda Hedges 02/16/2014, 2:14 PM

## 2014-02-17 ENCOUNTER — Encounter (HOSPITAL_COMMUNITY): Payer: Self-pay | Admitting: Orthopedic Surgery

## 2014-03-07 ENCOUNTER — Other Ambulatory Visit (HOSPITAL_BASED_OUTPATIENT_CLINIC_OR_DEPARTMENT_OTHER): Payer: BLUE CROSS/BLUE SHIELD

## 2014-03-07 ENCOUNTER — Ambulatory Visit (HOSPITAL_BASED_OUTPATIENT_CLINIC_OR_DEPARTMENT_OTHER): Payer: BLUE CROSS/BLUE SHIELD

## 2014-03-07 DIAGNOSIS — C9110 Chronic lymphocytic leukemia of B-cell type not having achieved remission: Secondary | ICD-10-CM

## 2014-03-07 DIAGNOSIS — D801 Nonfamilial hypogammaglobulinemia: Secondary | ICD-10-CM

## 2014-03-07 LAB — CBC WITH DIFFERENTIAL/PLATELET
BASO%: 0.8 % (ref 0.0–2.0)
Basophils Absolute: 0 10*3/uL (ref 0.0–0.1)
EOS ABS: 0.1 10*3/uL (ref 0.0–0.5)
EOS%: 1.2 % (ref 0.0–7.0)
HEMATOCRIT: 48.5 % (ref 38.4–49.9)
HEMOGLOBIN: 16.2 g/dL (ref 13.0–17.1)
LYMPH#: 0.3 10*3/uL — AB (ref 0.9–3.3)
LYMPH%: 5.5 % — AB (ref 14.0–49.0)
MCH: 32.5 pg (ref 27.2–33.4)
MCHC: 33.3 g/dL (ref 32.0–36.0)
MCV: 97.6 fL (ref 79.3–98.0)
MONO#: 0.5 10*3/uL (ref 0.1–0.9)
MONO%: 9.7 % (ref 0.0–14.0)
NEUT#: 4.5 10*3/uL (ref 1.5–6.5)
NEUT%: 82.8 % — ABNORMAL HIGH (ref 39.0–75.0)
Platelets: 101 10*3/uL — ABNORMAL LOW (ref 140–400)
RBC: 4.97 10*6/uL (ref 4.20–5.82)
RDW: 14.4 % (ref 11.0–14.6)
WBC: 5.4 10*3/uL (ref 4.0–10.3)

## 2014-03-07 MED ORDER — DIPHENHYDRAMINE HCL 25 MG PO CAPS
ORAL_CAPSULE | ORAL | Status: AC
  Filled 2014-03-07: qty 1

## 2014-03-07 MED ORDER — IMMUNE GLOBULIN (HUMAN) 10 GM/100ML IV SOLN
50.0000 g | Freq: Once | INTRAVENOUS | Status: AC
  Administered 2014-03-07: 50 g via INTRAVENOUS
  Filled 2014-03-07: qty 500

## 2014-03-07 MED ORDER — ACETAMINOPHEN 325 MG PO TABS
650.0000 mg | ORAL_TABLET | Freq: Four times a day (QID) | ORAL | Status: DC | PRN
  Administered 2014-03-07: 650 mg via ORAL

## 2014-03-07 MED ORDER — ACETAMINOPHEN 325 MG PO TABS
ORAL_TABLET | ORAL | Status: AC
  Filled 2014-03-07: qty 2

## 2014-03-07 MED ORDER — DIPHENHYDRAMINE HCL 25 MG PO TABS
25.0000 mg | ORAL_TABLET | Freq: Once | ORAL | Status: AC
  Administered 2014-03-07: 25 mg via ORAL
  Filled 2014-03-07: qty 1

## 2014-03-07 MED ORDER — SODIUM CHLORIDE 0.9 % IV SOLN
INTRAVENOUS | Status: AC
Start: 2014-03-07 — End: ?
  Administered 2014-03-07: 10:00:00 via INTRAVENOUS

## 2014-03-07 NOTE — Patient Instructions (Signed)

## 2014-04-11 ENCOUNTER — Ambulatory Visit (HOSPITAL_BASED_OUTPATIENT_CLINIC_OR_DEPARTMENT_OTHER): Payer: BLUE CROSS/BLUE SHIELD

## 2014-04-11 ENCOUNTER — Other Ambulatory Visit (HOSPITAL_BASED_OUTPATIENT_CLINIC_OR_DEPARTMENT_OTHER): Payer: BLUE CROSS/BLUE SHIELD

## 2014-04-11 DIAGNOSIS — C9110 Chronic lymphocytic leukemia of B-cell type not having achieved remission: Secondary | ICD-10-CM

## 2014-04-11 LAB — CBC WITH DIFFERENTIAL/PLATELET
BASO%: 0.3 % (ref 0.0–2.0)
BASOS ABS: 0 10*3/uL (ref 0.0–0.1)
EOS ABS: 0 10*3/uL (ref 0.0–0.5)
EOS%: 0.2 % (ref 0.0–7.0)
HEMATOCRIT: 43.6 % (ref 38.4–49.9)
HGB: 15.8 g/dL (ref 13.0–17.1)
LYMPH#: 0.3 10*3/uL — AB (ref 0.9–3.3)
LYMPH%: 4.6 % — ABNORMAL LOW (ref 14.0–49.0)
MCH: 33.8 pg — ABNORMAL HIGH (ref 27.2–33.4)
MCHC: 36.2 g/dL — ABNORMAL HIGH (ref 32.0–36.0)
MCV: 93.2 fL (ref 79.3–98.0)
MONO#: 0.7 10*3/uL (ref 0.1–0.9)
MONO%: 10.8 % (ref 0.0–14.0)
NEUT%: 84.1 % — ABNORMAL HIGH (ref 39.0–75.0)
NEUTROS ABS: 5.3 10*3/uL (ref 1.5–6.5)
PLATELETS: 87 10*3/uL — AB (ref 140–400)
RBC: 4.68 10*6/uL (ref 4.20–5.82)
RDW: 14.4 % (ref 11.0–14.6)
WBC: 6.3 10*3/uL (ref 4.0–10.3)

## 2014-04-11 MED ORDER — SODIUM CHLORIDE 0.9 % IJ SOLN
10.0000 mL | Freq: Once | INTRAMUSCULAR | Status: AC
  Administered 2014-04-11: 10 mL via INTRAVENOUS
  Filled 2014-04-11: qty 10

## 2014-04-11 MED ORDER — HEPARIN SOD (PORK) LOCK FLUSH 100 UNIT/ML IV SOLN
500.0000 [IU] | Freq: Once | INTRAVENOUS | Status: AC
  Administered 2014-04-11: 500 [IU] via INTRAVENOUS
  Filled 2014-04-11: qty 5

## 2014-04-11 MED ORDER — HEPARIN SOD (PORK) LOCK FLUSH 10 UNIT/ML IV SOLN: 10.0000 [IU] | Freq: Once | INTRAVENOUS | Status: DC

## 2014-04-11 MED ORDER — SODIUM CHLORIDE 0.9 % IV SOLN
Freq: Once | INTRAVENOUS | Status: AC
  Administered 2014-04-11: 10:00:00 via INTRAVENOUS

## 2014-04-11 MED ORDER — ACETAMINOPHEN 325 MG PO TABS
650.0000 mg | ORAL_TABLET | Freq: Four times a day (QID) | ORAL | Status: DC | PRN
  Administered 2014-04-11: 650 mg via ORAL

## 2014-04-11 MED ORDER — DIPHENHYDRAMINE HCL 25 MG PO TABS
25.0000 mg | ORAL_TABLET | Freq: Once | ORAL | Status: AC
  Administered 2014-04-11: 25 mg via ORAL
  Filled 2014-04-11: qty 1

## 2014-04-11 MED ORDER — DIPHENHYDRAMINE HCL 25 MG PO CAPS
ORAL_CAPSULE | ORAL | Status: AC
Start: 2014-04-11 — End: 2014-04-11
  Filled 2014-04-11: qty 1

## 2014-04-11 MED ORDER — IMMUNE GLOBULIN (HUMAN) 10 GM/100ML IV SOLN
50.0000 g | Freq: Once | INTRAVENOUS | Status: AC
  Administered 2014-04-11: 50 g via INTRAVENOUS
  Filled 2014-04-11: qty 500

## 2014-04-11 MED ORDER — ACETAMINOPHEN 325 MG PO TABS
ORAL_TABLET | ORAL | Status: AC
  Filled 2014-04-11: qty 2

## 2014-04-11 NOTE — Patient Instructions (Signed)

## 2014-04-18 ENCOUNTER — Encounter: Payer: Self-pay | Admitting: Hematology and Oncology

## 2014-04-22 ENCOUNTER — Other Ambulatory Visit: Payer: Self-pay | Admitting: *Deleted

## 2014-04-22 MED ORDER — NICODERM CQ 21 MG/24HR TD PT24: 21.0000 mg | MEDICATED_PATCH | Freq: Every day | TRANSDERMAL | Status: DC

## 2014-05-05 ENCOUNTER — Other Ambulatory Visit: Payer: Self-pay | Admitting: *Deleted

## 2014-05-05 MED ORDER — LIDOCAINE-PRILOCAINE 2.5-2.5 % EX CREA: 1.0000 "application " | TOPICAL_CREAM | CUTANEOUS | Status: DC | PRN

## 2014-05-05 MED ORDER — FLUTICASONE PROPIONATE HFA 110 MCG/ACT IN AERO: 1.0000 | INHALATION_SPRAY | RESPIRATORY_TRACT | Status: DC | PRN

## 2014-05-09 ENCOUNTER — Ambulatory Visit (HOSPITAL_BASED_OUTPATIENT_CLINIC_OR_DEPARTMENT_OTHER): Payer: BLUE CROSS/BLUE SHIELD

## 2014-05-09 ENCOUNTER — Other Ambulatory Visit (HOSPITAL_BASED_OUTPATIENT_CLINIC_OR_DEPARTMENT_OTHER): Payer: BLUE CROSS/BLUE SHIELD

## 2014-05-09 DIAGNOSIS — C9110 Chronic lymphocytic leukemia of B-cell type not having achieved remission: Secondary | ICD-10-CM

## 2014-05-09 DIAGNOSIS — D801 Nonfamilial hypogammaglobulinemia: Secondary | ICD-10-CM

## 2014-05-09 LAB — CBC WITH DIFFERENTIAL/PLATELET
BASO%: 0.3 % (ref 0.0–2.0)
Basophils Absolute: 0 10*3/uL (ref 0.0–0.1)
EOS ABS: 0 10*3/uL (ref 0.0–0.5)
EOS%: 0.6 % (ref 0.0–7.0)
HCT: 45.3 % (ref 38.4–49.9)
HGB: 16.5 g/dL (ref 13.0–17.1)
LYMPH%: 4.8 % — AB (ref 14.0–49.0)
MCH: 34 pg — ABNORMAL HIGH (ref 27.2–33.4)
MCHC: 36.4 g/dL — ABNORMAL HIGH (ref 32.0–36.0)
MCV: 93.2 fL (ref 79.3–98.0)
MONO#: 0.7 10*3/uL (ref 0.1–0.9)
MONO%: 10.2 % (ref 0.0–14.0)
NEUT%: 84.1 % — ABNORMAL HIGH (ref 39.0–75.0)
NEUTROS ABS: 5.4 10*3/uL (ref 1.5–6.5)
NRBC: 0 % (ref 0–0)
PLATELETS: 82 10*3/uL — AB (ref 140–400)
RBC: 4.86 10*6/uL (ref 4.20–5.82)
RDW: 14.3 % (ref 11.0–14.6)
WBC: 6.5 10*3/uL (ref 4.0–10.3)
lymph#: 0.3 10*3/uL — ABNORMAL LOW (ref 0.9–3.3)

## 2014-05-09 MED ORDER — SODIUM CHLORIDE 0.9 % IV SOLN
Freq: Once | INTRAVENOUS | Status: AC
  Administered 2014-05-09: 09:00:00 via INTRAVENOUS

## 2014-05-09 MED ORDER — IMMUNE GLOBULIN (HUMAN) 10 GM/100ML IV SOLN
50.0000 g | Freq: Once | INTRAVENOUS | Status: AC
  Administered 2014-05-09: 50 g via INTRAVENOUS
  Filled 2014-05-09: qty 500

## 2014-05-09 MED ORDER — ACETAMINOPHEN 325 MG PO TABS
650.0000 mg | ORAL_TABLET | Freq: Four times a day (QID) | ORAL | Status: DC | PRN
  Administered 2014-05-09: 650 mg via ORAL

## 2014-05-09 MED ORDER — DIPHENHYDRAMINE HCL 25 MG PO CAPS
ORAL_CAPSULE | ORAL | Status: AC
  Filled 2014-05-09: qty 1

## 2014-05-09 MED ORDER — DIPHENHYDRAMINE HCL 25 MG PO TABS
25.0000 mg | ORAL_TABLET | Freq: Once | ORAL | Status: AC
  Administered 2014-05-09: 25 mg via ORAL
  Filled 2014-05-09: qty 1

## 2014-05-09 MED ORDER — ACETAMINOPHEN 325 MG PO TABS
ORAL_TABLET | ORAL | Status: AC
  Filled 2014-05-09: qty 2

## 2014-05-09 NOTE — Progress Notes (Signed)
Patient refused to stay for 30 minutes post observation. Vital signs stable. Patient discharged ambulatory in no acute distress.+

## 2014-05-09 NOTE — Patient Instructions (Signed)

## 2014-06-03 ENCOUNTER — Telehealth: Payer: Self-pay

## 2014-06-03 NOTE — Telephone Encounter (Signed)
Reviewed patient on the schedule.

## 2014-06-03 NOTE — Telephone Encounter (Signed)
Patient has apt. With Hoyle Sauer 07/10/14. Patient has a balance of $ [phone]. Please advise if I need to CX this apt.  Thanks Hinton Dyer

## 2014-06-03 NOTE — Telephone Encounter (Signed)
If an appt. has been given to a patient we should keep it. In the future I can contact the patient prior to given the appt. Thanks Angie

## 2014-06-06 ENCOUNTER — Other Ambulatory Visit (HOSPITAL_BASED_OUTPATIENT_CLINIC_OR_DEPARTMENT_OTHER): Payer: BLUE CROSS/BLUE SHIELD

## 2014-06-06 ENCOUNTER — Ambulatory Visit (HOSPITAL_BASED_OUTPATIENT_CLINIC_OR_DEPARTMENT_OTHER): Payer: BLUE CROSS/BLUE SHIELD

## 2014-06-06 VITALS — BP 100/81 | HR 59 | Temp 97.8°F | Resp 18

## 2014-06-06 DIAGNOSIS — D801 Nonfamilial hypogammaglobulinemia: Secondary | ICD-10-CM | POA: Diagnosis not present

## 2014-06-06 DIAGNOSIS — C9110 Chronic lymphocytic leukemia of B-cell type not having achieved remission: Secondary | ICD-10-CM

## 2014-06-06 LAB — CBC WITH DIFFERENTIAL/PLATELET
BASO%: 0.4 % (ref 0.0–2.0)
Basophils Absolute: 0 10*3/uL (ref 0.0–0.1)
EOS%: 0.5 % (ref 0.0–7.0)
Eosinophils Absolute: 0 10*3/uL (ref 0.0–0.5)
HEMATOCRIT: 44.4 % (ref 38.4–49.9)
HGB: 16 g/dL (ref 13.0–17.1)
LYMPH%: 9.4 % — ABNORMAL LOW (ref 14.0–49.0)
MCH: 34.3 pg — ABNORMAL HIGH (ref 27.2–33.4)
MCHC: 36 g/dL (ref 32.0–36.0)
MCV: 95.1 fL (ref 79.3–98.0)
MONO#: 0.4 10*3/uL (ref 0.1–0.9)
MONO%: 7.1 % (ref 0.0–14.0)
NEUT%: 82.6 % — ABNORMAL HIGH (ref 39.0–75.0)
NEUTROS ABS: 4.6 10*3/uL (ref 1.5–6.5)
NRBC: 0 % (ref 0–0)
Platelets: 78 10*3/uL — ABNORMAL LOW (ref 140–400)
RBC: 4.67 10*6/uL (ref 4.20–5.82)
RDW: 14.4 % (ref 11.0–14.6)
WBC: 5.5 10*3/uL (ref 4.0–10.3)
lymph#: 0.5 10*3/uL — ABNORMAL LOW (ref 0.9–3.3)

## 2014-06-06 MED ORDER — ACETAMINOPHEN 325 MG PO TABS
ORAL_TABLET | ORAL | Status: AC
Start: 2014-06-06 — End: 2014-06-06
  Filled 2014-06-06: qty 2

## 2014-06-06 MED ORDER — SODIUM CHLORIDE 0.9 % IV SOLN
Freq: Once | INTRAVENOUS | Status: AC
  Administered 2014-06-06: 10:00:00 via INTRAVENOUS

## 2014-06-06 MED ORDER — SODIUM CHLORIDE 0.9 % IJ SOLN
10.0000 mL | Freq: Once | INTRAMUSCULAR | Status: AC
  Administered 2014-06-06: 10 mL
  Filled 2014-06-06: qty 10

## 2014-06-06 MED ORDER — IMMUNE GLOBULIN (HUMAN) 10 GM/100ML IV SOLN
50.0000 g | Freq: Once | INTRAVENOUS | Status: AC
  Administered 2014-06-06: 50 g via INTRAVENOUS
  Filled 2014-06-06: qty 500

## 2014-06-06 MED ORDER — DIPHENHYDRAMINE HCL 25 MG PO TABS
25.0000 mg | ORAL_TABLET | Freq: Once | ORAL | Status: AC
  Administered 2014-06-06: 25 mg via ORAL
  Filled 2014-06-06: qty 1

## 2014-06-06 MED ORDER — HEPARIN SOD (PORK) LOCK FLUSH 100 UNIT/ML IV SOLN
500.0000 [IU] | Freq: Once | INTRAVENOUS | Status: AC
  Administered 2014-06-06: 500 [IU] via INTRAVENOUS
  Filled 2014-06-06: qty 5

## 2014-06-06 MED ORDER — ACETAMINOPHEN 325 MG PO TABS
650.0000 mg | ORAL_TABLET | Freq: Four times a day (QID) | ORAL | Status: DC | PRN
  Administered 2014-06-06: 650 mg via ORAL

## 2014-06-06 MED ORDER — DIPHENHYDRAMINE HCL 25 MG PO CAPS
ORAL_CAPSULE | ORAL | Status: AC
  Filled 2014-06-06: qty 1

## 2014-06-06 NOTE — Patient Instructions (Signed)

## 2014-06-25 ENCOUNTER — Encounter: Payer: Self-pay | Admitting: *Deleted

## 2014-06-25 ENCOUNTER — Ambulatory Visit: Payer: BC Managed Care – PPO | Admitting: Nurse Practitioner

## 2014-06-30 ENCOUNTER — Encounter: Payer: Self-pay | Admitting: Hematology and Oncology

## 2014-06-30 ENCOUNTER — Other Ambulatory Visit: Payer: Self-pay | Admitting: *Deleted

## 2014-06-30 MED ORDER — NICODERM CQ 21 MG/24HR TD PT24: 21.0000 mg | MEDICATED_PATCH | Freq: Every day | TRANSDERMAL | Status: DC

## 2014-07-04 ENCOUNTER — Ambulatory Visit (HOSPITAL_BASED_OUTPATIENT_CLINIC_OR_DEPARTMENT_OTHER): Payer: BLUE CROSS/BLUE SHIELD | Admitting: Hematology and Oncology

## 2014-07-04 ENCOUNTER — Encounter: Payer: Self-pay | Admitting: Hematology and Oncology

## 2014-07-04 ENCOUNTER — Ambulatory Visit (HOSPITAL_BASED_OUTPATIENT_CLINIC_OR_DEPARTMENT_OTHER): Payer: BLUE CROSS/BLUE SHIELD

## 2014-07-04 ENCOUNTER — Other Ambulatory Visit (HOSPITAL_BASED_OUTPATIENT_CLINIC_OR_DEPARTMENT_OTHER): Payer: BLUE CROSS/BLUE SHIELD

## 2014-07-04 ENCOUNTER — Telehealth: Payer: Self-pay | Admitting: Hematology and Oncology

## 2014-07-04 VITALS — BP 132/63 | HR 54 | Temp 97.7°F | Resp 17 | Ht 72.0 in | Wt 227.9 lb

## 2014-07-04 VITALS — BP 117/61 | HR 54 | Temp 97.8°F | Resp 16

## 2014-07-04 DIAGNOSIS — D839 Common variable immunodeficiency, unspecified: Secondary | ICD-10-CM | POA: Diagnosis not present

## 2014-07-04 DIAGNOSIS — C9110 Chronic lymphocytic leukemia of B-cell type not having achieved remission: Secondary | ICD-10-CM

## 2014-07-04 DIAGNOSIS — D801 Nonfamilial hypogammaglobulinemia: Secondary | ICD-10-CM

## 2014-07-04 DIAGNOSIS — Z720 Tobacco use: Secondary | ICD-10-CM | POA: Diagnosis not present

## 2014-07-04 DIAGNOSIS — Z5189 Encounter for other specified aftercare: Secondary | ICD-10-CM | POA: Diagnosis not present

## 2014-07-04 DIAGNOSIS — D696 Thrombocytopenia, unspecified: Secondary | ICD-10-CM

## 2014-07-04 LAB — CBC WITH DIFFERENTIAL/PLATELET
BASO%: 0.2 % (ref 0.0–2.0)
Basophils Absolute: 0 10*3/uL (ref 0.0–0.1)
EOS%: 0.6 % (ref 0.0–7.0)
Eosinophils Absolute: 0 10*3/uL (ref 0.0–0.5)
HCT: 45.3 % (ref 38.4–49.9)
HEMOGLOBIN: 16.2 g/dL (ref 13.0–17.1)
LYMPH#: 0.3 10*3/uL — AB (ref 0.9–3.3)
LYMPH%: 5.1 % — ABNORMAL LOW (ref 14.0–49.0)
MCH: 33.8 pg — ABNORMAL HIGH (ref 27.2–33.4)
MCHC: 35.8 g/dL (ref 32.0–36.0)
MCV: 94.6 fL (ref 79.3–98.0)
MONO#: 0.7 10*3/uL (ref 0.1–0.9)
MONO%: 11.2 % (ref 0.0–14.0)
NEUT#: 5.2 10*3/uL (ref 1.5–6.5)
NEUT%: 82.9 % — ABNORMAL HIGH (ref 39.0–75.0)
Platelets: 83 10*3/uL — ABNORMAL LOW (ref 140–400)
RBC: 4.79 10*6/uL (ref 4.20–5.82)
RDW: 14.2 % (ref 11.0–14.6)
WBC: 6.2 10*3/uL (ref 4.0–10.3)

## 2014-07-04 MED ORDER — DIPHENHYDRAMINE HCL 25 MG PO CAPS
ORAL_CAPSULE | ORAL | Status: AC
  Filled 2014-07-04: qty 1

## 2014-07-04 MED ORDER — ACETAMINOPHEN 325 MG PO TABS
ORAL_TABLET | ORAL | Status: AC
  Filled 2014-07-04: qty 2

## 2014-07-04 MED ORDER — ALTEPLASE 2 MG IJ SOLR
2.0000 mg | Freq: Once | INTRAMUSCULAR | Status: AC
  Administered 2014-07-04: 2 mg
  Filled 2014-07-04: qty 2

## 2014-07-04 MED ORDER — IMMUNE GLOBULIN (HUMAN) 10 GM/100ML IV SOLN
0.5000 g/kg | Freq: Once | INTRAVENOUS | Status: AC
  Administered 2014-07-04: 50 g via INTRAVENOUS
  Filled 2014-07-04: qty 500

## 2014-07-04 MED ORDER — ACETAMINOPHEN 325 MG PO TABS
650.0000 mg | ORAL_TABLET | Freq: Four times a day (QID) | ORAL | Status: DC | PRN
  Administered 2014-07-04: 650 mg via ORAL

## 2014-07-04 MED ORDER — DIPHENHYDRAMINE HCL 25 MG PO TABS
25.0000 mg | ORAL_TABLET | Freq: Once | ORAL | Status: AC
  Administered 2014-07-04: 25 mg via ORAL
  Filled 2014-07-04: qty 1

## 2014-07-04 NOTE — Progress Notes (Signed)
[facility] OFFICE PROGRESS NOTE  Patient Care Team: [name], PA-C as PCP - General (Family Medicine) Elsie Saas, MD as Consulting Physician (Orthopedic Surgery)  SUMMARY OF ONCOLOGIC HISTORY:  This patient was originally diagnosed with CLL after he was found to have leukocytosis. Imaging study with CT scan show diffuse lymphadenopathy with splenomegaly. Due to worsening thrombocytopenia, he was subsequently treated with combination therapy with fludarabine, Cytoxan and rituximab from May 2014 to October 2014. The patient denies side effects or complications from treatment. He has significant smoking history with suspected COPD. 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 From October 2014 onwards, he was prescribed monthly IVIG infusion. INTERVAL HISTORY: Please see below for problem oriented charting. He feels well. Denies recent infection. He continues to smoke but is making an effort to quit smoking. Denies new lymphadenopathy. The patient denies any recent signs or symptoms of bleeding such as spontaneous epistaxis, hematuria or hematochezia.   REVIEW OF SYSTEMS:   Constitutional: Denies fevers, chills or abnormal weight loss Eyes: Denies blurriness of vision Ears, nose, mouth, throat, and face: Denies mucositis or sore throat Respiratory: Denies cough, dyspnea or wheezes Cardiovascular: Denies palpitation, chest discomfort or lower extremity swelling Gastrointestinal:  Denies nausea, heartburn or change in bowel habits Skin: Denies abnormal skin rashes Lymphatics: Denies new lymphadenopathy or easy bruising Neurological:Denies numbness, tingling or new weaknesses Behavioral/Psych: Mood is stable, no new changes  All other systems were reviewed with the patient and are negative.  I have reviewed the past medical history, past surgical history, social history and family history with the patient and they are  unchanged from previous note.  ALLERGIES:  is allergic to codeine.  MEDICATIONS:  Current Outpatient Prescriptions  Medication Sig Dispense Refill  . acyclovir (ZOVIRAX) 400 MG tablet Take 1 tablet (400 mg total) by mouth daily. 90 tablet 4  . ALPRAZolam (XANAX) 0.25 MG tablet Take 1 po q 6 hours prn for anxiety 60 tablet 0  . aspirin 325 MG tablet Take 325 mg by mouth daily.      . COMBIVENT RESPIMAT 20-100 MCG/ACT AERS respimat 1 puff every 6 (six) hours as needed.   0  . cyanocobalamin (,VITAMIN B-12,) 1000 MCG/ML injection Inject 1 mL (1,000 mcg total) into the muscle every 30 (thirty) days. 3 mL 1  . esomeprazole (NEXIUM) 40 MG capsule Take 1 capsule (40 mg total) by mouth 2 (two) times daily. 180 capsule 3  . fluticasone (FLOVENT HFA) 110 MCG/ACT inhaler Inhale 1 puff into the lungs as needed. 1 Inhaler 1  . lidocaine-prilocaine (EMLA) cream Apply 1 application topically as needed. 30 g 0  . metFORMIN (GLUCOPHAGE-XR) 500 MG 24 hr tablet Take 500 mg by mouth 2 (two) times daily.    . metoprolol succinate (TOPROL-XL) 50 MG 24 hr tablet Take 1 tablet (50 mg total) by mouth daily with breakfast. Take with or immediately following a meal. 90 tablet 3  . NICODERM CQ 21 MG/24HR patch Place 1 patch (21 mg total) onto the skin daily. 28 patch 1  . nitroGLYCERIN (NITROSTAT) 0.4 MG SL tablet Place 1 tablet (0.4 mg total) under the tongue every 5 (five) minutes as needed for chest pain. 90 tablet 3  . pregabalin (LYRICA) 75 MG capsule 1 cap three times daily and 2 caps at bedtime 450 capsule 1  . rosuvastatin (CRESTOR) 5 MG tablet Take 1 tablet (5 mg total) by mouth daily. 90 tablet 3  . sildenafil (VIAGRA) 100 MG  tablet Take 1 tablet (100 mg total) by mouth as needed. 10 tablet 6  . tetrahydrozoline 0.05 % ophthalmic solution Place 1 drop into both eyes as needed. Red eyes    . topiramate (TOPAMAX) 25 MG tablet Take 1 tablet (25 mg total) by mouth 2 (two) times daily. 180 tablet 3  . zolpidem  (AMBIEN) 10 MG tablet Take 10 mg by mouth at bedtime as needed for sleep.   0   No current facility-administered medications for this visit.   Facility-Administered Medications Ordered in Other Visits  Medication Dose Route Frequency Provider Last Rate Last Dose  . 0.9 %  sodium chloride infusion   Intravenous Continuous [name], MD 50 mL/hr at 03/07/14 1005    . acetaminophen (TYLENOL) tablet 650 mg  650 mg Oral Q6H PRN [name], MD   650 mg at 03/07/14 1012  . sodium chloride 0.9 % injection 10 mL  10 mL Intracatheter PRN Consuela Mimes, MD   10 mL at 08/22/12 1721  . sodium chloride 0.9 % injection 10 mL  10 mL Intravenous PRN Consuela Mimes, MD   10 mL at 05/29/13 1323    PHYSICAL EXAMINATION: ECOG PERFORMANCE STATUS: 0 - Asymptomatic  Filed Vitals:   07/04/14 0944  BP: 132/63  Pulse: 54  Temp: 97.7 F (36.5 C)  Resp: 17   Filed Weights   07/04/14 0944  Weight: 227 lb 14.4 oz (103.375 kg)    GENERAL:alert, no distress and comfortable SKIN: skin color, texture, turgor are normal, no rashes or significant lesions EYES: normal, Conjunctiva are pink and non-injected, sclera clear OROPHARYNX:no exudate, no erythema and lips, buccal mucosa, and tongue normal  NECK: supple, thyroid normal size, non-tender, without nodularity LYMPH:  no palpable lymphadenopathy in the cervical, axillary or inguinal LUNGS: clear to auscultation and percussion with normal breathing effort HEART: regular rate & rhythm and no murmurs and no lower extremity edema ABDOMEN:abdomen soft, non-tender and normal bowel sounds Musculoskeletal:no cyanosis of digits and no clubbing  NEURO: alert & oriented x 3 with fluent speech, no focal motor/sensory deficits  LABORATORY DATA:  I have reviewed the data as listed    Component Value Date/Time   NA 138 02/03/2014 1355   NA 138 07/26/2013 1210   K 4.3 02/03/2014 1355   K 4.0 07/26/2013 1210   CL 101 02/03/2014 1355   CL 104 08/03/2012 1229   CO2 24  02/03/2014 1355   CO2 24 07/26/2013 1210   GLUCOSE 191* 02/03/2014 1355   GLUCOSE 130 07/26/2013 1210   GLUCOSE 223* 08/03/2012 1229   BUN 9 02/03/2014 1355   BUN 12.5 07/26/2013 1210   CREATININE 0.80 02/03/2014 1355   CREATININE 1.0 07/26/2013 1210   CALCIUM 9.8 02/03/2014 1355   CALCIUM 9.1 07/26/2013 1210   PROT 8.0 02/03/2014 1355   PROT 6.3* 07/26/2013 1210   ALBUMIN 4.0 02/03/2014 1355   ALBUMIN 3.9 07/26/2013 1210   AST 16 02/03/2014 1355   AST 16 07/26/2013 1210   ALT 22 02/03/2014 1355   ALT 35 07/26/2013 1210   ALKPHOS 94 02/03/2014 1355   ALKPHOS 91 07/26/2013 1210   BILITOT 0.5 02/03/2014 1355   BILITOT 0.86 07/26/2013 1210   GFRNONAA >90 02/03/2014 1355   GFRAA >90 02/03/2014 1355    No results found for: SPEP, UPEP  Lab Results  Component Value Date   WBC 6.2 07/04/2014   NEUTROABS 5.2 07/04/2014   HGB 16.2 07/04/2014   HCT 45.3 07/04/2014   MCV  94.6 07/04/2014   PLT 83* 07/04/2014      Chemistry      Component Value Date/Time   NA 138 02/03/2014 1355   NA 138 07/26/2013 1210   K 4.3 02/03/2014 1355   K 4.0 07/26/2013 1210   CL 101 02/03/2014 1355   CL 104 08/03/2012 1229   CO2 24 02/03/2014 1355   CO2 24 07/26/2013 1210   BUN 9 02/03/2014 1355   BUN 12.5 07/26/2013 1210   CREATININE 0.80 02/03/2014 1355   CREATININE 1.0 07/26/2013 1210      Component Value Date/Time   CALCIUM 9.8 02/03/2014 1355   CALCIUM 9.1 07/26/2013 1210   ALKPHOS 94 02/03/2014 1355   ALKPHOS 91 07/26/2013 1210   AST 16 02/03/2014 1355   AST 16 07/26/2013 1210   ALT 22 02/03/2014 1355   ALT 35 07/26/2013 1210   BILITOT 0.5 02/03/2014 1355   BILITOT 0.86 07/26/2013 1210      ASSESSMENT & PLAN:  CLL (chronic lymphocytic leukemia) The patient has achieved complete response clinically. I will continue to see him every 6 months with history, physical examination and blood work and to defer imaging studies unless he had signs and symptoms to suggest disease  recurrence.     Hypogammaglobulinemia, acquired He has significant signs and symptoms of upper respiratory tract congestion since continuation of IVIG. The patient requested to resume therapy which I think is reasonable, to prevent risk of infection. The risk and benefits and side effects of IVIG infusion has been discussed with patient and he agreed to proceed. He has no side effects of IVIG or signs of serum sickness.   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 He is not symptomatic. There is no contraindication for him to remain on aspirin as well as the patient had no clinical bleeding.    Tobacco abuse He is motivated to quit smoking and is down to half a pack of cigarettes per day.  I continue to encourage him with his smoking cessation effort.    Orders Placed This Encounter  Procedures  . Comprehensive metabolic panel    Standing Status: Future     Number of Occurrences:      Standing Expiration Date: 08/08/2015  . Lactate dehydrogenase    Standing Status: Future     Number of Occurrences:      Standing Expiration Date: 08/08/2015   All questions were answered. The patient knows to call the clinic with any problems, questions or concerns. No barriers to learning was detected. I spent 20 minutes counseling the patient face to face. The total time spent in the appointment was 30 minutes and more than 50% was on counseling and review of test results     [facility], [place], MD 07/04/2014 11:06 AM

## 2014-07-04 NOTE — Assessment & Plan Note (Signed)
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 He is not symptomatic. There is no contraindication for him to remain on aspirin as well as the patient had no clinical bleeding.

## 2014-07-04 NOTE — Telephone Encounter (Signed)
Gave patient avs report and appointments for June thru November. Per 5/20 pof mthly inf every 3rd Friday.

## 2014-07-04 NOTE — Progress Notes (Signed)
TPA per port dwelled until 1609 when blood return obtained, cath flo removed and port flushed.

## 2014-07-04 NOTE — Assessment & Plan Note (Signed)
He has significant signs and symptoms of upper respiratory tract congestion since continuation of IVIG. The patient requested to resume therapy which I think is reasonable, to prevent risk of infection. The risk and benefits and side effects of IVIG infusion has been discussed with patient and he agreed to proceed. He has no side effects of IVIG or signs of serum sickness.  

## 2014-07-04 NOTE — Patient Instructions (Signed)

## 2014-07-04 NOTE — Assessment & Plan Note (Signed)
The patient has achieved complete response clinically. I will continue to see him every 6 months with history, physical examination and blood work and to defer imaging studies unless he had signs and symptoms to suggest disease recurrence. 

## 2014-07-04 NOTE — Assessment & Plan Note (Signed)
He is motivated to quit smoking and is down to half a pack of cigarettes per day.  I continue to encourage him with his smoking cessation effort.

## 2014-07-10 ENCOUNTER — Ambulatory Visit (INDEPENDENT_AMBULATORY_CARE_PROVIDER_SITE_OTHER): Payer: BLUE CROSS/BLUE SHIELD | Admitting: Nurse Practitioner

## 2014-07-10 ENCOUNTER — Encounter: Payer: Self-pay | Admitting: Nurse Practitioner

## 2014-07-10 VITALS — BP 137/78 | HR 66 | Ht 72.0 in | Wt 229.4 lb

## 2014-07-10 DIAGNOSIS — G609 Hereditary and idiopathic neuropathy, unspecified: Secondary | ICD-10-CM | POA: Diagnosis not present

## 2014-07-10 DIAGNOSIS — G619 Inflammatory polyneuropathy, unspecified: Secondary | ICD-10-CM | POA: Diagnosis not present

## 2014-07-10 DIAGNOSIS — G622 Polyneuropathy due to other toxic agents: Principal | ICD-10-CM

## 2014-07-10 MED ORDER — CYANOCOBALAMIN 1000 MCG/ML IJ SOLN: 1000.0000 ug | INTRAMUSCULAR | Status: DC

## 2014-07-10 MED ORDER — PREGABALIN 75 MG PO CAPS: ORAL_CAPSULE | ORAL | Status: DC

## 2014-07-10 MED ORDER — TOPIRAMATE 25 MG PO TABS: 25.0000 mg | ORAL_TABLET | Freq: Two times a day (BID) | ORAL | Status: DC

## 2014-07-10 NOTE — Progress Notes (Signed)
[facility]  PATIENT: Ryan Copeland DOB: 08-Jul-1950   REASON FOR VISIT: Follow-up for polyneuropathy  HISTORY FROM: Patient    HISTORY OF PRESENT ILLNESS:[name], 64 year old male returns for followup. He has a history of polyneuropathy which has been long-standing. He continues to have some pain, numbness and paresthesias in both feet left worse than right. Sensation is worse in great toes. His feet are cold he also has a burning sensation. His symptoms have not worsened since last seen Gabapentin was not beneficial for his symptoms. He was found to have a low B6 level and an extremely low vitamin B12 level at 121. He has been on vitamin B12 injections which his wife gives monthly. He is sleeping well. He is currently taking Lyrica 75 mg 3 times a day and two 50 mg at night. He was placed on Transdermal cream last year, with good results. He has failed Cymbalta. He is also on Topamax. He states his walking and balance are unchanged. He has not fallen. He continues to work full time. 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 He returns for reevaluation. His neuropathy symptoms are stable. He needs refills on his medication   REVIEW OF SYSTEMS: Full 14 system review of systems performed and notable only for those listed, all others are neg:  Constitutional: neg  Cardiovascular: neg Ear/Nose/Throat: neg  Skin: neg Eyes: neg Respiratory: neg Gastroitestinal: neg  Hematology/Lymphatic: neg  Endocrine: neg Musculoskeletal:neg Allergy/Immunology: neg Neurological: Numbness Psychiatric: neg Sleep : neg   ALLERGIES: Allergies  Allergen Reactions  . Codeine Hives    Pt states he can take a few, more reaction with extended doses.    HOME MEDICATIONS: Outpatient Prescriptions Prior to Visit  Medication Sig Dispense Refill  . acyclovir (ZOVIRAX) 400 MG tablet Take 1 tablet (400 mg total) by mouth daily. 90 tablet 4  . ALPRAZolam (XANAX) 0.25 MG tablet Take 1 po  q 6 hours prn for anxiety 60 tablet 0  . aspirin 325 MG tablet Take 325 mg by mouth daily.      . COMBIVENT RESPIMAT 20-100 MCG/ACT AERS respimat 1 puff every 6 (six) hours as needed.   0  . cyanocobalamin (,VITAMIN B-12,) 1000 MCG/ML injection Inject 1 mL (1,000 mcg total) into the muscle every 30 (thirty) days. 3 mL 1  . esomeprazole (NEXIUM) 40 MG capsule Take 1 capsule (40 mg total) by mouth 2 (two) times daily. 180 capsule 3  . fluticasone (FLOVENT HFA) 110 MCG/ACT inhaler Inhale 1 puff into the lungs as needed. 1 Inhaler 1  . lidocaine-prilocaine (EMLA) cream Apply 1 application topically as needed. 30 g 0  . metFORMIN (GLUCOPHAGE-XR) 500 MG 24 hr tablet Take 500 mg by mouth 2 (two) times daily.    . metoprolol succinate (TOPROL-XL) 50 MG 24 hr tablet Take 1 tablet (50 mg total) by mouth daily with breakfast. Take with or immediately following a meal. 90 tablet 3  . NICODERM CQ 21 MG/24HR patch Place 1 patch (21 mg total) onto the skin daily. 28 patch 1  . nitroGLYCERIN (NITROSTAT) 0.4 MG SL tablet Place 1 tablet (0.4 mg total) under the tongue every 5 (five) minutes as needed for chest pain. 90 tablet 3  . pregabalin (LYRICA) 75 MG capsule 1 cap three times daily and 2 caps at bedtime 450 capsule 1  . rosuvastatin (CRESTOR) 5 MG tablet Take 1 tablet (5 mg total) by mouth daily. 90 tablet 3  . sildenafil (VIAGRA) 100 MG tablet  Take 1 tablet (100 mg total) by mouth as needed. 10 tablet 6  . tetrahydrozoline 0.05 % ophthalmic solution Place 1 drop into both eyes as needed. Red eyes    . topiramate (TOPAMAX) 25 MG tablet Take 1 tablet (25 mg total) by mouth 2 (two) times daily. 180 tablet 3  . zolpidem (AMBIEN) 10 MG tablet Take 10 mg by mouth at bedtime as needed for sleep.   0   Facility-Administered Medications Prior to Visit  Medication Dose Route Frequency Provider Last Rate Last Dose  . 0.9 %  sodium chloride infusion   Intravenous Continuous [name], MD 50 mL/hr at 03/07/14 1005      . acetaminophen (TYLENOL) tablet 650 mg  650 mg Oral Q6H PRN [name], MD   650 mg at 03/07/14 1012  . sodium chloride 0.9 % injection 10 mL  10 mL Intracatheter PRN Consuela Mimes, MD   10 mL at 08/22/12 1721  . sodium chloride 0.9 % injection 10 mL  10 mL Intravenous PRN Consuela Mimes, MD   10 mL at 05/29/13 1323    PAST MEDICAL HISTORY: Past Medical History  Diagnosis Date  . 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 Hyperlipidemia   . Tobacco abuse   . PVD (peripheral vascular disease)   . CAD (coronary artery disease)   . Anxiety 01/30/2014  . Diabetes mellitus     Type 2   . GERD (gastroesophageal reflux disease)     takes Nexium if needed  . Neuromuscular disorder     peripheral neuropathy  . 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 ECRB (extensor carpi radialis brevis) tenosynovitis   . Lateral epicondylitis of left elbow     PAST SURGICAL HISTORY: Past Surgical History  Procedure Laterality Date  . Femoral stents    . Coronary stent placement  May 2011  . Cholecystectomy  2007  . Carpel tunnel release Left [id number]  . Carpel tunnel release  Right 01-1989  . Tarsal tunnel release Bilateral 08-2007  . Left cai stent/pta and popliteal artery/tibial thrombectomy     . Left heart catheterization with coronary angiogram N/A 08/26/2011    Procedure: LEFT HEART CATHETERIZATION WITH CORONARY ANGIOGRAM;  Surgeon: Peter M Martinique, MD;  Location: [facility] CATH LAB;  Service: Cardiovascular;  Laterality: N/A;  . Cardiac catheterization    . Adenoidectomy  1955  . Lateral epicondyle release Left 02/12/2014    Procedure: LEFT ELBOW DEBRIDEMENT WITH TENDON REPAIR ;  Surgeon: Lorn Junes, MD;  Location: [place];  Service: Orthopedics;  Laterality: Left;    FAMILY HISTORY: Family History  Problem Relation Age of Onset  . Lung cancer Mother 71  . Cancer Mother     lung  . Heart failure Father 69  . Heart disease Father     SOCIAL  HISTORY: History   Social History  . Marital Status: Married    Spouse Name: [name]  . Number of Children: 1  . Years of Education: College   Occupational History  .      [employer]   Social History Main Topics  . Smoking status: Current Every Day Smoker -- 0.50 packs/day for 44 years    Types: Cigarettes  . Smokeless tobacco: Former Systems developer  . Alcohol Use: No     Comment:  drinks non-alcoholic beer  . Drug Use: No  . Sexual Activity: Yes   Other Topics Concern  . Not  on file   Social History Narrative   Patient lives at home with wife.   Caffeine Use: 15 cups of caffeine weekly     PHYSICAL EXAM  Filed Vitals:   07/10/14 0757  BP: 137/78  Pulse: 66  Height: 6' (1.829 m)  Weight: 229 lb 6.4 oz (104.055 kg)   Body mass index is 31.11 kg/(m^2). Generalized: Well developed, in no acute distress  Head: normocephalic and atraumatic,. Oropharynx benign  Neck: Supple, no carotid bruits  Cardiac: Regular rate rhythm, no murmur  Musculoskeletal: No deformity   Neurological examination   Mentation: Alert oriented to time, place, history taking. Follows all commands speech and language fluent  Cranial nerve II-XII: Pupils were equal round reactive to light extraocular movements were full, visual field were full on confrontational test. Facial sensation and strength were normal. hearing was intact to finger rubbing bilaterally. Uvula tongue midline. head turning and shoulder shrug were normal and symmetric.Tongue protrusion into cheek strength was normal. Motor: normal bulk and tone, full strength in the BUE, BLE, fine finger movements normal, no pronator drift. No focal weakness Sensory: Diminished pinprick to the shins bilaterally, decreased vibratory to the toes, position sense is normal  Coordination: finger-nose-finger, heel-to-shin bilaterally, no dysmetria Reflexes: Diminished upper and lower and symmetric, plantar responses were flexor bilaterally. Gait and  Station: Rising up from seated position without assistance, normal stance, moderate stride, good arm swing, smooth turning, able to perform tiptoe, and heel walking without difficulty. Tandem gait is mildly unsteady. No assistive device  DIAGNOSTIC DATA (LABS, IMAGING, TESTING) - I reviewed patient records, labs, notes, testing and imaging myself where available.  Lab Results  Component Value Date   WBC 6.2 07/04/2014   HGB 16.2 07/04/2014   HCT 45.3 07/04/2014   MCV 94.6 07/04/2014   PLT 83* 07/04/2014      Component Value Date/Time   NA 138 02/03/2014 1355   NA 138 07/26/2013 1210   K 4.3 02/03/2014 1355   K 4.0 07/26/2013 1210   CL 101 02/03/2014 1355   CL 104 08/03/2012 1229   CO2 24 02/03/2014 1355   CO2 24 07/26/2013 1210   GLUCOSE 191* 02/03/2014 1355   GLUCOSE 130 07/26/2013 1210   GLUCOSE 223* 08/03/2012 1229   BUN 9 02/03/2014 1355   BUN 12.5 07/26/2013 1210   CREATININE 0.80 02/03/2014 1355   CREATININE 1.0 07/26/2013 1210   CALCIUM 9.8 02/03/2014 1355   CALCIUM 9.1 07/26/2013 1210   PROT 8.0 02/03/2014 1355   PROT 6.3* 07/26/2013 1210   ALBUMIN 4.0 02/03/2014 1355   ALBUMIN 3.9 07/26/2013 1210   AST 16 02/03/2014 1355   AST 16 07/26/2013 1210   ALT 22 02/03/2014 1355   ALT 35 07/26/2013 1210   ALKPHOS 94 02/03/2014 1355   ALKPHOS 91 07/26/2013 1210   BILITOT 0.5 02/03/2014 1355   BILITOT 0.86 07/26/2013 1210   GFRNONAA >90 02/03/2014 1355   GFRAA >90 02/03/2014 1355   Lab Results  Component Value Date   CHOL 153 02/04/2014   HDL 26.20* 02/04/2014   LDLCALC 89 02/04/2014   LDLDIRECT 117.6 08/22/2011   TRIG 189.0* 02/04/2014   CHOLHDL 6 02/04/2014   Lab Results  Component Value Date   HGBA1C 7.5* 07/26/2013    ASSESSMENT AND PLAN  64 y.o. year old male  has a past medical history of MI, acute, non ST segment elevation (06/28/2009); Hyperlipidemia; Tobacco abuse; PVD (peripheral vascular disease); CAD (coronary artery disease); Anxiety  (01/30/2014); Diabetes mellitus;  CLL (chronic lymphocytic leukemia) (03/18/2011); and long-standing history of polyneuropathy here to follow-up.  Continue Lyrica at current dose will refill 90 day supply Continue Topamax at current dose will refillsupply with 3 refills Continue B12 injections monthly 90 day supply will refill Follow-up yearly and when necessary Dennie Bible, [facility], [facility], [facility] [address], [place], [place] [address] [phone]

## 2014-07-10 NOTE — Progress Notes (Signed)
I have reviewed and agreed above plan. 

## 2014-07-10 NOTE — Patient Instructions (Signed)
Continue Lyrica at current dose will refill 90 day supply Continue Topamax at current dose will refillsupply with 3 refills Continue B12 injections monthly 90 day supply will refill Follow-up yearly and when necessary

## 2014-08-01 ENCOUNTER — Ambulatory Visit (HOSPITAL_BASED_OUTPATIENT_CLINIC_OR_DEPARTMENT_OTHER): Payer: BLUE CROSS/BLUE SHIELD

## 2014-08-01 VITALS — BP 103/51 | HR 55 | Temp 97.2°F | Resp 16

## 2014-08-01 DIAGNOSIS — C9110 Chronic lymphocytic leukemia of B-cell type not having achieved remission: Secondary | ICD-10-CM

## 2014-08-01 DIAGNOSIS — D801 Nonfamilial hypogammaglobulinemia: Secondary | ICD-10-CM | POA: Diagnosis not present

## 2014-08-01 MED ORDER — ACETAMINOPHEN 325 MG PO TABS
650.0000 mg | ORAL_TABLET | Freq: Four times a day (QID) | ORAL | Status: DC | PRN
  Administered 2014-08-01: 650 mg via ORAL

## 2014-08-01 MED ORDER — SODIUM CHLORIDE 0.9 % IV SOLN
Freq: Once | INTRAVENOUS | Status: AC
  Administered 2014-08-01: 08:00:00 via INTRAVENOUS

## 2014-08-01 MED ORDER — SODIUM CHLORIDE 0.9 % IJ SOLN
10.0000 mL | INTRAMUSCULAR | Status: DC | PRN
  Administered 2014-08-01: 10 mL via INTRAVENOUS
  Filled 2014-08-01: qty 10

## 2014-08-01 MED ORDER — DIPHENHYDRAMINE HCL 25 MG PO CAPS
ORAL_CAPSULE | ORAL | Status: AC
  Filled 2014-08-01: qty 1

## 2014-08-01 MED ORDER — IMMUNE GLOBULIN (HUMAN) 10 GM/100ML IV SOLN
50.0000 g | Freq: Once | INTRAVENOUS | Status: AC
  Administered 2014-08-01: 50 g via INTRAVENOUS
  Filled 2014-08-01: qty 500

## 2014-08-01 MED ORDER — HEPARIN SOD (PORK) LOCK FLUSH 100 UNIT/ML IV SOLN
500.0000 [IU] | Freq: Once | INTRAVENOUS | Status: AC
  Administered 2014-08-01: 500 [IU] via INTRAVENOUS
  Filled 2014-08-01: qty 5

## 2014-08-01 MED ORDER — ACETAMINOPHEN 325 MG PO TABS
ORAL_TABLET | ORAL | Status: AC
  Filled 2014-08-01: qty 2

## 2014-08-01 MED ORDER — DIPHENHYDRAMINE HCL 25 MG PO TABS
25.0000 mg | ORAL_TABLET | Freq: Once | ORAL | Status: AC
  Administered 2014-08-01: 25 mg via ORAL
  Filled 2014-08-01: qty 1

## 2014-08-01 NOTE — Patient Instructions (Signed)

## 2014-08-04 ENCOUNTER — Ambulatory Visit: Payer: BC Managed Care – PPO | Admitting: Nurse Practitioner

## 2014-08-04 ENCOUNTER — Encounter: Payer: Self-pay | Admitting: Nurse Practitioner

## 2014-08-15 ENCOUNTER — Ambulatory Visit (INDEPENDENT_AMBULATORY_CARE_PROVIDER_SITE_OTHER): Payer: BLUE CROSS/BLUE SHIELD | Admitting: Nurse Practitioner

## 2014-08-15 ENCOUNTER — Encounter: Payer: Self-pay | Admitting: Nurse Practitioner

## 2014-08-15 VITALS — BP 118/72 | HR 72 | Ht 72.0 in | Wt 228.0 lb

## 2014-08-15 DIAGNOSIS — E119 Type 2 diabetes mellitus without complications: Secondary | ICD-10-CM

## 2014-08-15 DIAGNOSIS — E785 Hyperlipidemia, unspecified: Secondary | ICD-10-CM

## 2014-08-15 DIAGNOSIS — I2510 Atherosclerotic heart disease of native coronary artery without angina pectoris: Secondary | ICD-10-CM | POA: Diagnosis not present

## 2014-08-15 DIAGNOSIS — I10 Essential (primary) hypertension: Secondary | ICD-10-CM

## 2014-08-15 LAB — BASIC METABOLIC PANEL
BUN: 12 mg/dL (ref 6–23)
CO2: 25 mEq/L (ref 19–32)
Calcium: 9.2 mg/dL (ref 8.4–10.5)
Chloride: 105 mEq/L (ref 96–112)
Creatinine, Ser: 0.93 mg/dL (ref 0.40–1.50)
GFR: 87.14 mL/min (ref >60.00)
Glucose, Bld: 210 mg/dL — ABNORMAL HIGH (ref 70–99)
Potassium: 4.2 mEq/L (ref 3.5–5.1)
Sodium: 138 mEq/L (ref 135–145)

## 2014-08-15 LAB — HEPATIC FUNCTION PANEL
ALT: 24 U/L (ref 0–53)
AST: 19 U/L (ref 0–37)
Albumin: 3.9 g/dL (ref 3.5–5.2)
Alkaline Phosphatase: 76 U/L (ref 39–117)
Bilirubin, Direct: 0.1 mg/dL (ref 0.0–0.3)
Total Bilirubin: 0.7 mg/dL (ref 0.2–1.2)
Total Protein: 6.7 g/dL (ref 6.0–8.3)

## 2014-08-15 LAB — TSH: TSH: 2 u[IU]/mL (ref 0.35–4.50)

## 2014-08-15 LAB — LIPID PANEL
Cholesterol: 134 mg/dL (ref 0–200)
HDL: 29.1 mg/dL — ABNORMAL LOW (ref >39.00)
LDL Cholesterol: 75 mg/dL (ref 0–99)
NonHDL: 104.9
Total CHOL/HDL Ratio: 5
Triglycerides: 151 mg/dL — ABNORMAL HIGH (ref 0.0–149.0)
VLDL: 30.2 mg/dL (ref 0.0–40.0)

## 2014-08-15 MED ORDER — METOPROLOL SUCCINATE ER 50 MG PO TB24
25.0000 mg | ORAL_TABLET | Freq: Every day | ORAL | Status: DC
Start: 2014-08-15 — End: 2014-08-15

## 2014-08-15 MED ORDER — METOPROLOL SUCCINATE ER 25 MG PO TB24: 25.0000 mg | ORAL_TABLET | Freq: Every day | ORAL | Status: DC

## 2014-08-15 NOTE — Patient Instructions (Addendum)
We will be checking the following labs today - BMET, HPF and lipids   Medication Instructions:    Continue with your current medicines but  I am cutting the metoprolol back to just 25 mg a day - this is at your drug store.     Testing/Procedures To Be Arranged:  N/A  Follow-Up:   I will see you back in 4 weeks    Other Special Instructions:   N/A  Call the [place] office at [phone] if you have any questions, problems or concerns.

## 2014-08-15 NOTE — Progress Notes (Signed)
CARDIOLOGY OFFICE NOTE  Date:  08/15/2014    Ryan Copeland Date of Birth: Jan 27, 1951 Medical Record #[id number]  PCP:  Tula Nakayama  Cardiologist:  Nahser    Chief Complaint  Patient presents with  . Coronary Artery Disease    6 month check - seen for Dr. Acie Fredrickson    History of Present Illness: Ryan Copeland is a 64 y.o. male who presents today for a 6 month check. Seen for Dr. Acie Fredrickson. Former patient of Dr. Susa Simmonds. Has known prior NSTEMI and stenting of the LAD in 2011. Underwent repeat cath in June of 2013 - stent was patent - has known totally occluded RCA that fills by collaterals - managed medically. Other issues include tobacco abuse, PVD with past iliac stenting, HTN, HLD, DM and CLL .  I saw him back in December.  Stable cardiac status. Still smoking but had plan in place to stop.   Comes back today. Here alone. He notes that he is more fatigued. Has no energy. Has come on over the past several months. No worse but no better. Some chest pain - has used maybe 2 NTG since last here. Walks all day at work. Notes HR has been low at home. Not dizzy or lightheaded. No syncope. Continues to smoke - wants to quit but continues to struggle.    Past Medical History  Diagnosis Date  . 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 Hyperlipidemia   . Tobacco abuse   . PVD (peripheral vascular disease)   . CAD (coronary artery disease)   . Anxiety 01/30/2014  . Diabetes mellitus     Type 2   . GERD (gastroesophageal reflux disease)     takes Nexium if needed  . Neuromuscular disorder     peripheral neuropathy  . 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 ECRB (extensor carpi radialis brevis) tenosynovitis   . Lateral epicondylitis of left elbow     Past Surgical History  Procedure Laterality Date  . Femoral stents    . Coronary stent placement  May 2011  . Cholecystectomy  2007  . Carpel tunnel release Left [id number]  . Carpel  tunnel release  Right 01-1989  . Tarsal tunnel release Bilateral 08-2007  . Left cai stent/pta and popliteal artery/tibial thrombectomy     . Left heart catheterization with coronary angiogram N/A 08/26/2011    Procedure: LEFT HEART CATHETERIZATION WITH CORONARY ANGIOGRAM;  Surgeon: Peter M Martinique, MD;  Location: [facility] CATH LAB;  Service: Cardiovascular;  Laterality: N/A;  . Cardiac catheterization    . Adenoidectomy  1955  . Lateral epicondyle release Left 02/12/2014    Procedure: LEFT ELBOW DEBRIDEMENT WITH TENDON REPAIR ;  Surgeon: Lorn Junes, MD;  Location: [facility];  Service: Orthopedics;  Laterality: Left;     Medications: Current Outpatient Prescriptions  Medication Sig Dispense Refill  . acyclovir (ZOVIRAX) 400 MG tablet Take 1 tablet (400 mg total) by mouth daily. 90 tablet 4  . ALPRAZolam (XANAX) 0.25 MG tablet Take 1 po q 6 hours prn for anxiety 60 tablet 0  . aspirin 325 MG tablet Take 325 mg by mouth daily.      . COMBIVENT RESPIMAT 20-100 MCG/ACT AERS respimat 1 puff every 6 (six) hours as needed.   0  . cyanocobalamin (,VITAMIN B-12,) 1000 MCG/ML injection Inject 1 mL (1,000 mcg total) into the muscle every 30 (thirty) days. 3 mL 3  .  esomeprazole (NEXIUM) 40 MG capsule Take 1 capsule (40 mg total) by mouth 2 (two) times daily. 180 capsule 3  . fluticasone (FLOVENT HFA) 110 MCG/ACT inhaler Inhale 1 puff into the lungs as needed. 1 Inhaler 1  . lidocaine-prilocaine (EMLA) cream Apply 1 application topically as needed. 30 g 0  . meloxicam (MOBIC) 15 MG tablet Take 15 mg by mouth daily.    . metFORMIN (GLUCOPHAGE-XR) 500 MG 24 hr tablet Take 500 mg by mouth 2 (two) times daily.    Marland Kitchen NICODERM CQ 21 MG/24HR patch Place 1 patch (21 mg total) onto the skin daily. 28 patch 1  . nitroGLYCERIN (NITROSTAT) 0.4 MG SL tablet Place 1 tablet (0.4 mg total) under the tongue every 5 (five) minutes as needed for chest pain. 90 tablet 3  . pregabalin (LYRICA) 75 MG capsule 1 cap three times  daily and 2 caps at bedtime 450 capsule 1  . rosuvastatin (CRESTOR) 5 MG tablet Take 1 tablet (5 mg total) by mouth daily. 90 tablet 3  . sildenafil (VIAGRA) 100 MG tablet Take 1 tablet (100 mg total) by mouth as needed. 10 tablet 6  . tetrahydrozoline 0.05 % ophthalmic solution Place 1 drop into both eyes as needed. Red eyes    . topiramate (TOPAMAX) 25 MG tablet Take 1 tablet (25 mg total) by mouth 2 (two) times daily. 180 tablet 3  . zolpidem (AMBIEN) 10 MG tablet Take 10 mg by mouth at bedtime as needed for sleep.   0  . metoprolol succinate (TOPROL XL) 25 MG 24 hr tablet Take 1 tablet (25 mg total) by mouth daily. 90 tablet 3   No current facility-administered medications for this visit.   Facility-Administered Medications Ordered in Other Visits  Medication Dose Route Frequency Provider Last Rate Last Dose  . 0.9 %  sodium chloride infusion   Intravenous Continuous [name], MD 50 mL/hr at 03/07/14 1005    . acetaminophen (TYLENOL) tablet 650 mg  650 mg Oral Q6H PRN [name], MD   650 mg at 03/07/14 1012  . sodium chloride 0.9 % injection 10 mL  10 mL Intracatheter PRN Consuela Mimes, MD   10 mL at 08/22/12 1721  . sodium chloride 0.9 % injection 10 mL  10 mL Intravenous PRN Consuela Mimes, MD   10 mL at 05/29/13 1323    Allergies: Allergies  Allergen Reactions  . Codeine Hives    Pt states he can take a few, more reaction with extended doses.    Social History: The patient  reports that he has been smoking Cigarettes.  He has a 22 pack-year smoking history. He has quit using smokeless tobacco. He reports that he does not drink alcohol or use illicit drugs.   Family History: The patient's family history includes Cancer in his mother; Heart disease in his father; Heart failure (age of onset: 23) in his father; Lung cancer (age of onset: 77) in his mother.   Review of Systems: Please see the history of present illness.   Otherwise, the review of systems is positive for none.   All  other systems are reviewed and negative.   Physical Exam: VS:  BP 118/72 mmHg  Pulse 72  Ht 6' (1.829 m)  Wt 228 lb (103.42 kg)  BMI 30.92 kg/m2 .  BMI Body mass index is 30.92 kg/(m^2).  Wt Readings from Last 3 Encounters:  08/15/14 228 lb (103.42 kg)  07/10/14 229 lb 6.4 oz (104.055 kg)  07/04/14 227 lb  14.4 oz (103.375 kg)    General: Pleasant. Well developed, well nourished and in no acute distress.  HEENT: Normal. Neck: Supple, no JVD, carotid bruits, or masses noted.  Cardiac: Regular rate and rhythm. No murmurs, rubs, or gallops. No edema.  Respiratory:  Lungs are clear to auscultation bilaterally with normal work of breathing.  GI: Soft and nontender.  MS: No deformity or atrophy. Gait and ROM intact. Skin: Warm and dry. Color is normal.  Neuro:  Strength and sensation are intact and no gross focal deficits noted.  Psych: Alert, appropriate and with normal affect.   LABORATORY DATA:  EKG:  EKG is not ordered today.   Lab Results  Component Value Date   WBC 6.2 07/04/2014   HGB 16.2 07/04/2014   HCT 45.3 07/04/2014   PLT 83* 07/04/2014   GLUCOSE 191* 02/03/2014   CHOL 153 02/04/2014   TRIG 189.0* 02/04/2014   HDL 26.20* 02/04/2014   LDLDIRECT 117.6 08/22/2011   LDLCALC 89 02/04/2014   ALT 22 02/03/2014   AST 16 02/03/2014   NA 138 02/03/2014   K 4.3 02/03/2014   CL 101 02/03/2014   CREATININE 0.80 02/03/2014   BUN 9 02/03/2014   CO2 24 02/03/2014   INR 1.00 02/03/2014   HGBA1C 7.5* 07/26/2013    BNP (last 3 results) No results for input(s): BNP in the last 8760 hours.  ProBNP (last 3 results) No results for input(s): PROBNP in the last 8760 hours.   Other Studies Reviewed Today: Coronary angiography:  Coronary dominance: right  Left mainstem: Normal.  Left anterior descending (LAD): The stent in the proximal LAD is widely patent throughout. There is moderate 20% narrowing in the LAD proximal to the stent. The remainder of the vessels without  significant disease.  Left circumflex (LCx): The left circumflex gives rise to 2 large marginal branches. There is 20% narrowing prior to the takeoff of the first OM.  Right coronary artery (RCA): The right coronary is a dominant vessel. It has diffuse 70% disease in the proximal to mid vessel. Is occluded at the crux. There are excellent right to right and left to right collaterals.  Left ventriculography: Left ventricular systolic function is normal, LVEF is estimated at 55-65%, there is no significant mitral regurgitation  Final Conclusions:  1. Single vessel obstructive coronary disease. 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 The stent in the proximal LAD is widely patent.  2. Normal LV function.  Recommendations: Continue medical management.  Peter Martinique  08/26/2011, 9:09 AM   Assessment / Plan: 1. CAD - few episodes of chest pain - endorsing more fatigue/no energy at this time. Will try cutting his metoprolol back. See back in one month. If no improvement, may need to consider repeat cath. If he has more chest pain on less metoprolol, he is to let me know. Check EKG today. Lab today as well.   2. HTN - BP is great - no change in therapy - I am cutting the metoprolol back today.   3. HLD - recheck lipids today - on statin therapy   4. ED - uses Viagra  5. Tobacco abuse - continues to struggle.   6. CLL/chronic thrombocytopenia - followed by oncology/hematology.   Current medicines are reviewed with the patient today.  The patient does not have concerns regarding medicines other than what has been noted above.  The following changes have been made:  See above.  Labs/ tests ordered today  include:    Orders Placed This Encounter  Procedures  . Basic metabolic panel  . Hepatic function panel  . Lipid panel  . TSH  . EKG 12-Lead     Disposition:   FU with me in 4 weeks.    Patient is agreeable to this plan and will  call if any problems develop in the interim.   Signed: Burtis Junes, RN, ANP-C 08/15/2014 9:03 AM  [facility] [address] [place], [place]  [address] Phone: [phone] Fax: [phone]

## 2014-08-29 ENCOUNTER — Ambulatory Visit (HOSPITAL_BASED_OUTPATIENT_CLINIC_OR_DEPARTMENT_OTHER): Payer: BLUE CROSS/BLUE SHIELD

## 2014-08-29 VITALS — BP 125/60 | HR 58 | Temp 97.8°F | Resp 18

## 2014-08-29 DIAGNOSIS — C9110 Chronic lymphocytic leukemia of B-cell type not having achieved remission: Secondary | ICD-10-CM

## 2014-08-29 DIAGNOSIS — D801 Nonfamilial hypogammaglobulinemia: Secondary | ICD-10-CM

## 2014-08-29 MED ORDER — SODIUM CHLORIDE 0.9 % IJ SOLN
10.0000 mL | INTRAMUSCULAR | Status: AC | PRN
  Administered 2014-08-29: 10 mL
  Filled 2014-08-29: qty 10

## 2014-08-29 MED ORDER — SODIUM CHLORIDE 0.9 % IV SOLN
INTRAVENOUS | Status: DC
  Administered 2014-08-29: 09:00:00 via INTRAVENOUS

## 2014-08-29 MED ORDER — ACETAMINOPHEN 325 MG PO TABS
ORAL_TABLET | ORAL | Status: AC
  Filled 2014-08-29: qty 2

## 2014-08-29 MED ORDER — ACETAMINOPHEN 325 MG PO TABS
650.0000 mg | ORAL_TABLET | Freq: Four times a day (QID) | ORAL | Status: DC | PRN
  Administered 2014-08-29: 650 mg via ORAL

## 2014-08-29 MED ORDER — DIPHENHYDRAMINE HCL 25 MG PO CAPS
ORAL_CAPSULE | ORAL | Status: AC
  Filled 2014-08-29: qty 1

## 2014-08-29 MED ORDER — DIPHENHYDRAMINE HCL 25 MG PO TABS
25.0000 mg | ORAL_TABLET | Freq: Once | ORAL | Status: AC
  Administered 2014-08-29: 25 mg via ORAL
  Filled 2014-08-29: qty 1

## 2014-08-29 MED ORDER — ALTEPLASE 2 MG IJ SOLR
2.0000 mg | Freq: Once | INTRAMUSCULAR | Status: AC
  Administered 2014-08-29: 2 mg
  Filled 2014-08-29: qty 2

## 2014-08-29 MED ORDER — IMMUNE GLOBULIN (HUMAN) 10 GM/100ML IV SOLN
50.0000 g | Freq: Once | INTRAVENOUS | Status: AC
  Administered 2014-08-29: 50 g via INTRAVENOUS
  Filled 2014-08-29: qty 500

## 2014-08-29 MED ORDER — HEPARIN SOD (PORK) LOCK FLUSH 100 UNIT/ML IV SOLN
500.0000 [IU] | INTRAVENOUS | Status: AC | PRN
  Administered 2014-08-29: 500 [IU]
  Filled 2014-08-29: qty 5

## 2014-08-29 NOTE — Progress Notes (Signed)
PAC flushes easily but no blood return.  Discussed with Dr. Alvy Bimler VO given and read back for cathflo. 1025  Excellent blood return from [facility].  47m of serum withdrawn with  cathflo.

## 2014-08-29 NOTE — Patient Instructions (Signed)

## 2014-09-01 ENCOUNTER — Telehealth: Payer: Self-pay | Admitting: *Deleted

## 2014-09-01 NOTE — Telephone Encounter (Signed)
S/w pt is aware of schedule change per Truitt Merle, NP.

## 2014-09-15 ENCOUNTER — Ambulatory Visit: Payer: BLUE CROSS/BLUE SHIELD | Admitting: Nurse Practitioner

## 2014-09-15 ENCOUNTER — Encounter: Payer: Self-pay | Admitting: Nurse Practitioner

## 2014-09-15 ENCOUNTER — Other Ambulatory Visit: Payer: Self-pay | Admitting: Nurse Practitioner

## 2014-09-15 ENCOUNTER — Ambulatory Visit (INDEPENDENT_AMBULATORY_CARE_PROVIDER_SITE_OTHER): Payer: BLUE CROSS/BLUE SHIELD | Admitting: Nurse Practitioner

## 2014-09-15 VITALS — BP 124/72 | HR 59 | Resp 18 | Ht 72.0 in | Wt 225.0 lb

## 2014-09-15 DIAGNOSIS — I2510 Atherosclerotic heart disease of native coronary artery without angina pectoris: Secondary | ICD-10-CM

## 2014-09-15 DIAGNOSIS — R5383 Other fatigue: Secondary | ICD-10-CM | POA: Diagnosis not present

## 2014-09-15 DIAGNOSIS — I10 Essential (primary) hypertension: Secondary | ICD-10-CM | POA: Diagnosis not present

## 2014-09-15 MED ORDER — NICODERM CQ 21 MG/24HR TD PT24
21.0000 mg | MEDICATED_PATCH | Freq: Every day | TRANSDERMAL | Status: DC
Start: 2014-09-15 — End: 2014-11-11

## 2014-09-15 NOTE — Patient Instructions (Addendum)
We will be checking the following labs today - NONE  Labs on August 8th - BMET, CBC, PT, PTT   Medication Instructions:    Continue with your current medicines.   I have refilled the nicoderm patches    Testing/Procedures To Be Arranged:  Cardiac Catheterization  Your provider has recommended a cardiac catherization  You are scheduled for a cardiac catheterization on Friday, August 12th  with Dr. Martinique or associate.  Go to [facility] 2nd Floor Short Stay on Friday, August 12th at 7 AM.  Enter thru the [employer] entrance A No food or drink after midnight on Thursday. You may take your medications with a sip of water on the day of your procedure.   Last dose of Metformin on Wednesday, August 10th.  Coronary Angiogram A coronary angiogram, also called coronary angiography, is an X-ray procedure used to look at the arteries in the heart. In this procedure, a dye (contrast dye) is injected through a long, hollow tube (catheter). The catheter is about the size of a piece of cooked spaghetti and is inserted through your groin, wrist, or arm. The dye is injected into each artery, and X-rays are then taken to show if there is a blockage in the arteries of your heart.  LET [facility] CARE PROVIDER KNOW ABOUT:  Any allergies you have, including allergies to shellfish or contrast dye.   All medicines you are taking, including vitamins, herbs, eye drops, creams, and over-the-counter medicines.   Previous problems you or members of your family have had with the use of anesthetics.   Any blood disorders you have.   Previous surgeries you have had.  History of kidney problems or failure.   Other medical conditions you have.  RISKS AND COMPLICATIONS  Generally, a coronary angiogram is a safe procedure. However, about 1 person out of 1000 can have problems that may include:  Allergic reaction to the dye.  Bleeding/bruising from the access site or other  locations.  Kidney injury, especially in people with impaired kidney function.  Stroke (rare).  Heart attack (rare).  Irregular rhythms (rare)  Death (rare)  BEFORE THE PROCEDURE   Do not eat or drink anything after midnight the night before the procedure or as directed by your health care provider.   Ask your health care provider about changing or stopping your regular medicines. This is especially important if you are taking diabetes medicines or blood thinners.  PROCEDURE  You may be given a medicine to help you relax (sedative) before the procedure. This medicine is given through an intravenous (IV) access tube that is inserted into one of your veins.   The area where the catheter will be inserted will be washed and shaved. This is usually done in the groin but may be done in the fold of your arm (near your elbow) or in the wrist.   A medicine will be given to numb the area where the catheter will be inserted (local anesthetic).   The health care provider will insert the catheter into an artery. The catheter will be guided by using a special type of X-ray (fluoroscopy) of the blood vessel being examined.   A special dye will then be injected into the catheter, and X-rays will be taken. The dye will help to show where any narrowing or blockages are located in the heart arteries.    AFTER THE PROCEDURE   If the procedure is done through the leg, you will be kept in bed  lying flat for several hours. You will be instructed to not bend or cross your legs.  The insertion site will be checked frequently.   The pulse in your feet or wrist will be checked frequently.   Additional blood tests, X-rays, and an electrocardiogram may be done.     Other Special Instructions:   N/A  Call the [place] office at [phone] if you have any questions, problems or concerns.

## 2014-09-15 NOTE — Progress Notes (Signed)
CARDIOLOGY OFFICE NOTE  Date:  09/15/2014    Ryan Copeland Date of Birth: Jul 20, 1950 Medical Record #[id number]  PCP:  Tula Nakayama  Cardiologist:  Nahser    Chief Complaint  Patient presents with  . Fatigue    One month check - seen for Dr. Acie Fredrickson    History of Present Illness: Ryan Copeland is a 64 y.o. male who presents today for a one month check. Seen for Dr. Acie Fredrickson. Former patient of Dr. Susa Simmonds. Has known prior NSTEMI and stenting of the LAD in 2011. Underwent repeat cath in June of 2013 - stent was patent - has known totally occluded RCA that fills by collaterals - managed medically. Other issues include tobacco abuse, PVD with past iliac stenting, HTN, HLD, DM and CLL .  I saw him back in December. Stable cardiac status. Still smoking but had plan in place to stop.   I saw him last month - he was more fatigued and "no energy". Some rare chest pain and had used NTG. I cut his metoprolol back. He was still smoking.  Comes back today. Here alone. He is really no better. No worsening of chest pain. Just has no energy and feels "like something is wrong". Continues to smoke - really wants to stop but has been unsuccessful - wants to try the patches again. Chantix was "like taking sugar pills". Not short of breath. Not dizzy. Walks a lot at work. Does not report much of a chest pain syndrome prior to his last stent until the "last moment" which he wants to avoid.   Past Medical History  Diagnosis Date  . 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 Hyperlipidemia   . Tobacco abuse   . PVD (peripheral vascular disease)   . CAD (coronary artery disease)   . Anxiety 01/30/2014  . Diabetes mellitus     Type 2   . GERD (gastroesophageal reflux disease)     takes Nexium if needed  . Neuromuscular disorder     peripheral neuropathy  . 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 ECRB (extensor carpi radialis brevis) tenosynovitis    . Lateral epicondylitis of left elbow     Past Surgical History  Procedure Laterality Date  . Femoral stents    . Coronary stent placement  May 2011  . Cholecystectomy  2007  . Carpel tunnel release Left [id number]  . Carpel tunnel release  Right 01-1989  . Tarsal tunnel release Bilateral 08-2007  . Left cai stent/pta and popliteal artery/tibial thrombectomy     . Left heart catheterization with coronary angiogram N/A 08/26/2011    Procedure: LEFT HEART CATHETERIZATION WITH CORONARY ANGIOGRAM;  Surgeon: Peter M Martinique, MD;  Location: [facility] CATH LAB;  Service: Cardiovascular;  Laterality: N/A;  . Cardiac catheterization    . Adenoidectomy  1955  . Lateral epicondyle release Left 02/12/2014    Procedure: LEFT ELBOW DEBRIDEMENT WITH TENDON REPAIR ;  Surgeon: Lorn Junes, MD;  Location: [place];  Service: Orthopedics;  Laterality: Left;     Medications: Current Outpatient Prescriptions  Medication Sig Dispense Refill  . acyclovir (ZOVIRAX) 400 MG tablet Take 1 tablet (400 mg total) by mouth daily. 90 tablet 4  . ALPRAZolam (XANAX) 0.25 MG tablet Take 1 po q 6 hours prn for anxiety 60 tablet 0  . aspirin 325 MG tablet Take 325 mg by mouth daily.      Marland Kitchen  COMBIVENT RESPIMAT 20-100 MCG/ACT AERS respimat 1 puff every 6 (six) hours as needed.   0  . cyanocobalamin (,VITAMIN B-12,) 1000 MCG/ML injection Inject 1 mL (1,000 mcg total) into the muscle every 30 (thirty) days. 3 mL 3  . esomeprazole (NEXIUM) 40 MG capsule Take 1 capsule (40 mg total) by mouth 2 (two) times daily. 180 capsule 3  . fluticasone (FLOVENT HFA) 110 MCG/ACT inhaler Inhale 1 puff into the lungs as needed. 1 Inhaler 1  . lidocaine-prilocaine (EMLA) cream Apply 1 application topically as needed. 30 g 0  . meloxicam (MOBIC) 15 MG tablet Take 15 mg by mouth daily.    . metFORMIN (GLUCOPHAGE-XR) 500 MG 24 hr tablet Take 500 mg by mouth 2 (two) times daily.    . metoprolol succinate (TOPROL XL) 25 MG 24 hr tablet Take 1 tablet (25  mg total) by mouth daily. 90 tablet 3  . nitroGLYCERIN (NITROSTAT) 0.4 MG SL tablet Place 1 tablet (0.4 mg total) under the tongue every 5 (five) minutes as needed for chest pain. 90 tablet 3  . pregabalin (LYRICA) 75 MG capsule 1 cap three times daily and 2 caps at bedtime 450 capsule 1  . rosuvastatin (CRESTOR) 5 MG tablet Take 1 tablet (5 mg total) by mouth daily. 90 tablet 3  . sildenafil (VIAGRA) 100 MG tablet Take 1 tablet (100 mg total) by mouth as needed. 10 tablet 6  . tetrahydrozoline 0.05 % ophthalmic solution Place 1 drop into both eyes as needed. Red eyes    . topiramate (TOPAMAX) 25 MG tablet Take 1 tablet (25 mg total) by mouth 2 (two) times daily. 180 tablet 3  . zolpidem (AMBIEN) 10 MG tablet Take 10 mg by mouth at bedtime as needed for sleep.   0  . NICODERM CQ 21 MG/24HR patch Place 1 patch (21 mg total) onto the skin daily. 30 patch 1   No current facility-administered medications for this visit.   Facility-Administered Medications Ordered in Other Visits  Medication Dose Route Frequency Provider Last Rate Last Dose  . 0.9 %  sodium chloride infusion   Intravenous Continuous [name], MD 50 mL/hr at 03/07/14 1005    . acetaminophen (TYLENOL) tablet 650 mg  650 mg Oral Q6H PRN [name], MD   650 mg at 03/07/14 1012  . sodium chloride 0.9 % injection 10 mL  10 mL Intracatheter PRN Consuela Mimes, MD   10 mL at 08/22/12 1721  . sodium chloride 0.9 % injection 10 mL  10 mL Intravenous PRN Consuela Mimes, MD   10 mL at 05/29/13 1323    Allergies: Allergies  Allergen Reactions  . Codeine Hives    Pt states he can take a few, more reaction with extended doses.    Social History: The patient  reports that he has been smoking Cigarettes.  He has a 22 pack-year smoking history. He has quit using smokeless tobacco. He reports that he does not drink alcohol or use illicit drugs.   Family History: The patient's family history includes Cancer in his mother; Heart disease in his  father; Heart failure (age of onset: 27) in his father; Lung cancer (age of onset: 45) in his mother.   Review of Systems: Please see the history of present illness.   Otherwise, the review of systems is positive for fatigue.   All other systems are reviewed and negative.   Physical Exam: VS:  BP 124/72 mmHg  Pulse 59  Resp 18  Ht 6' (  1.829 m)  Wt 225 lb (102.059 kg)  BMI 30.51 kg/m2  SpO2 96% .  BMI Body mass index is 30.51 kg/(m^2).  Wt Readings from Last 3 Encounters:  09/15/14 225 lb (102.059 kg)  08/15/14 228 lb (103.42 kg)  07/10/14 229 lb 6.4 oz (104.055 kg)    General: Pleasant. Well developed, well nourished and in no acute distress.  HEENT: Normal. Neck: Supple, no JVD, carotid bruits, or masses noted.  Cardiac: Regular rate and rhythm. No murmurs, rubs, or gallops. No edema.  Respiratory:  Lungs are clear to auscultation bilaterally with normal work of breathing.  GI: Soft and nontender.  MS: No deformity or atrophy. Gait and ROM intact. Skin: Warm and dry. Color is normal.  Neuro:  Strength and sensation are intact and no gross focal deficits noted.  Psych: Alert, appropriate and with normal affect.   LABORATORY DATA:  EKG:  EKG is not ordered today.  Lab Results  Component Value Date   WBC 6.2 07/04/2014   HGB 16.2 07/04/2014   HCT 45.3 07/04/2014   PLT 83* 07/04/2014   GLUCOSE 210* 08/15/2014   CHOL 134 08/15/2014   TRIG 151.0* 08/15/2014   HDL 29.10* 08/15/2014   LDLDIRECT 117.6 08/22/2011   LDLCALC 75 08/15/2014   ALT 24 08/15/2014   AST 19 08/15/2014   NA 138 08/15/2014   K 4.2 08/15/2014   CL 105 08/15/2014   CREATININE 0.93 08/15/2014   BUN 12 08/15/2014   CO2 25 08/15/2014   TSH 2.00 08/15/2014   INR 1.00 02/03/2014   HGBA1C 7.5* 07/26/2013    BNP (last 3 results) No results for input(s): BNP in the last 8760 hours.  ProBNP (last 3 results) No results for input(s): PROBNP in the last 8760 hours.   Other Studies Reviewed  Today:  Coronary angiography:  Coronary dominance: right  Left mainstem: Normal.  Left anterior descending (LAD): The stent in the proximal LAD is widely patent throughout. There is moderate 20% narrowing in the LAD proximal to the stent. The remainder of the vessels without significant disease.  Left circumflex (LCx): The left circumflex gives rise to 2 large marginal branches. There is 20% narrowing prior to the takeoff of the first OM.  Right coronary artery (RCA): The right coronary is a dominant vessel. It has diffuse 70% disease in the proximal to mid vessel. Is occluded at the crux. There are excellent right to right and left to right collaterals.  Left ventriculography: Left ventricular systolic function is normal, LVEF is estimated at 55-65%, there is no significant mitral regurgitation  Final Conclusions:  1. Single vessel obstructive coronary disease. 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 The stent in the proximal LAD is widely patent.  2. Normal LV function.  Recommendations: Continue medical management.  Peter Martinique  08/26/2011, 9:09 AM   Assessment / Plan: 1. CAD - prior PCI back in 2011 - last cath from 3 years ago - continues to endorse strong feeling of fatigue - clearly a change in his symptoms over the past 6 months. No real change with cutting back his beta blocker. Has known totally occluded RCA with collaterals. I think a Myoview will be abnormal. Will proceed on with cardiac cath. The patient understands that risks include but are not limited to stroke (1 in 1000), death (1 in 47), kidney failure [usually temporary] (1 in 500), bleeding (1 in 200), allergic reaction [possibly serious] (1 in 200), and agrees to  proceed. Scheduled for Friday August 12th with Dr. Martinique. Lab on August 8th.   2. HTN - BP is great - no change in therapy -I have left him on his current regimen for now.   3. HLD - on statin therapy    4. ED - uses Viagra  5. Tobacco abuse - continues to struggle with stopping. Trying the patches again - refill sent in. Cessation encouraged.   6. CLL/chronic thrombocytopenia - followed by oncology/hematology.   Current medicines are reviewed with the patient today.  The patient does not have concerns regarding medicines other than what has been noted above.  The following changes have been made:  See above.  Labs/ tests ordered today include:    Orders Placed This Encounter  Procedures  . Basic metabolic panel  . CBC  . Protime-INR  . APTT     Disposition:   Further disposition to follow.   Patient is agreeable to this plan and will call if any problems develop in the interim.   Signed: Burtis Junes, RN, ANP-C 09/15/2014 9:05 AM  [facility] [address] [place], [place]  [address] Phone: [phone] Fax: [phone]

## 2014-09-22 ENCOUNTER — Other Ambulatory Visit (INDEPENDENT_AMBULATORY_CARE_PROVIDER_SITE_OTHER): Payer: BLUE CROSS/BLUE SHIELD | Admitting: *Deleted

## 2014-09-22 DIAGNOSIS — I2510 Atherosclerotic heart disease of native coronary artery without angina pectoris: Secondary | ICD-10-CM

## 2014-09-22 DIAGNOSIS — I10 Essential (primary) hypertension: Secondary | ICD-10-CM | POA: Diagnosis not present

## 2014-09-22 DIAGNOSIS — R5383 Other fatigue: Secondary | ICD-10-CM

## 2014-09-22 LAB — CBC
HCT: 46.6 % (ref 39.0–52.0)
Hemoglobin: 16 g/dL (ref 13.0–17.0)
MCHC: 34.3 g/dL (ref 30.0–36.0)
MCV: 97.8 fl (ref 78.0–100.0)
Platelets: 98 10*3/uL — ABNORMAL LOW (ref 150.0–400.0)
RBC: 4.77 Mil/uL (ref 4.22–5.81)
RDW: 14.5 % (ref 11.5–15.5)
WBC: 7 10*3/uL (ref 4.0–10.5)

## 2014-09-22 LAB — BASIC METABOLIC PANEL
BUN: 15 mg/dL (ref 6–23)
CO2: 26 mEq/L (ref 19–32)
Calcium: 9.1 mg/dL (ref 8.4–10.5)
Chloride: 103 mEq/L (ref 96–112)
Creatinine, Ser: 0.98 mg/dL (ref 0.40–1.50)
GFR: 82 mL/min (ref >60.00)
Glucose, Bld: 252 mg/dL — ABNORMAL HIGH (ref 70–99)
Potassium: 3.8 mEq/L (ref 3.5–5.1)
Sodium: 136 mEq/L (ref 135–145)

## 2014-09-22 LAB — PROTIME-INR
INR: 1 ratio (ref 0.8–1.0)
Prothrombin Time: 11.6 s (ref 9.6–13.1)

## 2014-09-22 LAB — APTT: aPTT: 34.1 s — ABNORMAL HIGH (ref 23.4–32.7)

## 2014-09-26 ENCOUNTER — Encounter (HOSPITAL_COMMUNITY): Payer: Self-pay | Admitting: Cardiology

## 2014-09-26 ENCOUNTER — Encounter (HOSPITAL_COMMUNITY)
Admission: RE | Disposition: A | Discharge status: Home / Self Care | Payer: BLUE CROSS/BLUE SHIELD | Source: Ambulatory Visit | Attending: Cardiology

## 2014-09-26 ENCOUNTER — Ambulatory Visit (HOSPITAL_COMMUNITY)
Admission: RE | Admit: 2014-09-26 | Discharge: 2014-09-26 | Disposition: A | Discharge status: Home / Self Care | Payer: BLUE CROSS/BLUE SHIELD | Source: Ambulatory Visit | Attending: Cardiology | Admitting: Cardiology

## 2014-09-26 DIAGNOSIS — I2582 Chronic total occlusion of coronary artery: Secondary | ICD-10-CM | POA: Insufficient documentation

## 2014-09-26 DIAGNOSIS — Z8249 Family history of ischemic heart disease and other diseases of the circulatory system: Secondary | ICD-10-CM | POA: Insufficient documentation

## 2014-09-26 DIAGNOSIS — E119 Type 2 diabetes mellitus without complications: Secondary | ICD-10-CM | POA: Insufficient documentation

## 2014-09-26 DIAGNOSIS — I2510 Atherosclerotic heart disease of native coronary artery without angina pectoris: Secondary | ICD-10-CM | POA: Diagnosis present

## 2014-09-26 DIAGNOSIS — I252 Old myocardial infarction: Secondary | ICD-10-CM | POA: Insufficient documentation

## 2014-09-26 DIAGNOSIS — D696 Thrombocytopenia, unspecified: Secondary | ICD-10-CM | POA: Diagnosis not present

## 2014-09-26 DIAGNOSIS — I739 Peripheral vascular disease, unspecified: Secondary | ICD-10-CM | POA: Diagnosis not present

## 2014-09-26 DIAGNOSIS — E785 Hyperlipidemia, unspecified: Secondary | ICD-10-CM | POA: Insufficient documentation

## 2014-09-26 DIAGNOSIS — Z885 Allergy status to narcotic agent status: Secondary | ICD-10-CM | POA: Insufficient documentation

## 2014-09-26 DIAGNOSIS — C9110 Chronic lymphocytic leukemia of B-cell type not having achieved remission: Secondary | ICD-10-CM | POA: Diagnosis not present

## 2014-09-26 DIAGNOSIS — Z79899 Other long term (current) drug therapy: Secondary | ICD-10-CM | POA: Insufficient documentation

## 2014-09-26 DIAGNOSIS — F17210 Nicotine dependence, cigarettes, uncomplicated: Secondary | ICD-10-CM | POA: Insufficient documentation

## 2014-09-26 DIAGNOSIS — F419 Anxiety disorder, unspecified: Secondary | ICD-10-CM | POA: Diagnosis not present

## 2014-09-26 DIAGNOSIS — Z7982 Long term (current) use of aspirin: Secondary | ICD-10-CM | POA: Insufficient documentation

## 2014-09-26 DIAGNOSIS — I10 Essential (primary) hypertension: Secondary | ICD-10-CM | POA: Diagnosis not present

## 2014-09-26 DIAGNOSIS — C8308 Small cell B-cell lymphoma, lymph nodes of multiple sites: Secondary | ICD-10-CM | POA: Diagnosis present

## 2014-09-26 DIAGNOSIS — Z720 Tobacco use: Secondary | ICD-10-CM | POA: Diagnosis present

## 2014-09-26 HISTORY — PX: CARDIAC CATHETERIZATION: SHX172

## 2014-09-26 LAB — GLUCOSE, CAPILLARY: Glucose-Capillary: 219 mg/dL — ABNORMAL HIGH (ref 65–99)

## 2014-09-26 SURGERY — LEFT HEART CATH AND CORONARY ANGIOGRAPHY
Anesthesia: LOCAL

## 2014-09-26 MED ORDER — FENTANYL CITRATE (PF) 100 MCG/2ML IJ SOLN
INTRAMUSCULAR | Status: DC | PRN
  Administered 2014-09-26: 25 ug via INTRAVENOUS

## 2014-09-26 MED ORDER — FENTANYL CITRATE (PF) 100 MCG/2ML IJ SOLN
INTRAMUSCULAR | Status: AC
  Filled 2014-09-26: qty 4

## 2014-09-26 MED ORDER — VERAPAMIL HCL 2.5 MG/ML IV SOLN
INTRAVENOUS | Status: AC
  Filled 2014-09-26: qty 2

## 2014-09-26 MED ORDER — SODIUM CHLORIDE 0.9 % IV SOLN
INTRAVENOUS | Status: DC
Start: 2014-09-26 — End: 2014-09-26
  Administered 2014-09-26: 08:00:00 via INTRAVENOUS

## 2014-09-26 MED ORDER — SODIUM CHLORIDE 0.9 % WEIGHT BASED INFUSION: 1.0000 mL/kg/h | INTRAVENOUS | Status: DC

## 2014-09-26 MED ORDER — MIDAZOLAM HCL 2 MG/2ML IJ SOLN
INTRAMUSCULAR | Status: DC | PRN
  Administered 2014-09-26: 2 mg via INTRAVENOUS

## 2014-09-26 MED ORDER — SODIUM CHLORIDE 0.9 % IJ SOLN: 3.0000 mL | Freq: Two times a day (BID) | INTRAMUSCULAR | Status: DC

## 2014-09-26 MED ORDER — DIAZEPAM 5 MG PO TABS
ORAL_TABLET | ORAL | Status: AC
  Filled 2014-09-26: qty 2

## 2014-09-26 MED ORDER — ASPIRIN 81 MG PO CHEW
81.0000 mg | CHEWABLE_TABLET | ORAL | Status: AC
  Administered 2014-09-26: 81 mg via ORAL

## 2014-09-26 MED ORDER — DIAZEPAM 5 MG PO TABS
10.0000 mg | ORAL_TABLET | ORAL | Status: AC
  Administered 2014-09-26: 10 mg via ORAL

## 2014-09-26 MED ORDER — LIDOCAINE HCL (PF) 1 % IJ SOLN
INTRAMUSCULAR | Status: AC
  Filled 2014-09-26: qty 30

## 2014-09-26 MED ORDER — SODIUM CHLORIDE 0.9 % IV SOLN: 250.0000 mL | INTRAVENOUS | Status: DC | PRN

## 2014-09-26 MED ORDER — SODIUM CHLORIDE 0.9 % IJ SOLN: 3.0000 mL | INTRAMUSCULAR | Status: DC | PRN

## 2014-09-26 MED ORDER — MIDAZOLAM HCL 2 MG/2ML IJ SOLN
INTRAMUSCULAR | Status: AC
  Filled 2014-09-26: qty 4

## 2014-09-26 MED ORDER — IOHEXOL 350 MG/ML SOLN
INTRAVENOUS | Status: DC | PRN
  Administered 2014-09-26: 75 mL via INTRAVENOUS

## 2014-09-26 MED ORDER — HEPARIN SODIUM (PORCINE) 1000 UNIT/ML IJ SOLN
INTRAMUSCULAR | Status: AC
  Filled 2014-09-26: qty 1

## 2014-09-26 MED ORDER — ASPIRIN 81 MG PO CHEW
CHEWABLE_TABLET | ORAL | Status: AC
  Filled 2014-09-26: qty 1

## 2014-09-26 MED ORDER — HEPARIN (PORCINE) IN NACL 2-0.9 UNIT/ML-% IJ SOLN
INTRAMUSCULAR | Status: AC
  Filled 2014-09-26: qty 1000

## 2014-09-26 SURGICAL SUPPLY — 12 items
CATH INFINITI 5 FR JL3.5 (CATHETERS) ×2 IMPLANT
CATH INFINITI 5FR ANG PIGTAIL (CATHETERS) ×2 IMPLANT
CATH INFINITI 5FR JL4 (CATHETERS) ×2 IMPLANT
CATH INFINITI JR4 5F (CATHETERS) ×2 IMPLANT
DEVICE RAD COMP TR BAND LRG (VASCULAR PRODUCTS) ×2 IMPLANT
GLIDESHEATH SLEND SS 6F .021 (SHEATH) ×2 IMPLANT
KIT HEART LEFT (KITS) ×2 IMPLANT
PACK CARDIAC CATHETERIZATION (CUSTOM PROCEDURE TRAY) ×2 IMPLANT
SYR MEDRAD MARK V 150ML (SYRINGE) ×2 IMPLANT
TRANSDUCER W/STOPCOCK (MISCELLANEOUS) ×2 IMPLANT
TUBING CIL FLEX 10 FLL-RA (TUBING) ×2 IMPLANT
TUBING CONTRAST HIGH PRESS 20 (MISCELLANEOUS) ×2 IMPLANT

## 2014-09-26 NOTE — Progress Notes (Signed)
TR BAND REMOVAL   LOCATION:    left radial  DEFLATED PER PROTOCOL:    Yes.    TIME BAND OFF / DRESSING APPLIED:  1145  SITE UPON ARRIVAL:    Level 0  SITE AFTER BAND REMOVAL:    Level 0  CIRCULATION SENSATION AND MOVEMENT:    Within Normal Limits   Yes.    COMMENTS:   trb removed / tegaderm dsg applied

## 2014-09-26 NOTE — Discharge Instructions (Signed)
Radial Site Care Refer to this sheet in the next few weeks. These instructions provide you with information on caring for yourself after your procedure. Your caregiver may also give you more specific instructions. Your treatment has been planned according to current medical practices, but problems sometimes occur. Call your caregiver if you have any problems or questions after your procedure. HOME CARE INSTRUCTIONS  You may shower the day after the procedure.Remove the bandage (dressing) and gently wash the site with plain soap and water.Gently pat the site dry.  Do not apply powder or lotion to the site.  Do not submerge the affected site in water for 3 to 5 days.  Inspect the site at least twice daily.  Do not flex or bend the affected arm for 24 hours.  No lifting over 5 pounds (2.3 kg) for 5 days after your procedure.  Do not drive home if you are discharged the same day of the procedure. Have someone else drive you.  You may drive 24 hours after the procedure unless otherwise instructed by your caregiver.  Do not operate machinery or power tools for 24 hours.  A responsible adult should be with you for the first 24 hours after you arrive home. What to expect:  Any bruising will usually fade within 1 to 2 weeks.  Blood that collects in the tissue (hematoma) may be painful to the touch. It should usually decrease in size and tenderness within 1 to 2 weeks. SEEK IMMEDIATE MEDICAL CARE IF:  You have unusual pain at the radial site.  You have redness, warmth, swelling, or pain at the radial site.  You have drainage (other than a small amount of blood on the dressing).  You have chills.  You have a fever or persistent symptoms for more than 72 hours.  You have a fever and your symptoms suddenly get worse.  Your arm becomes pale, cool, tingly, or numb.  You have heavy bleeding from the site. Hold pressure on the site. Document Released: 03/05/2010 Document Revised:  04/25/2011 Document Reviewed: 03/05/2010 [facility] Patient Information 2015 [place], [place]. This information is not intended to replace advice given to you by your health care provider. Make sure you discuss any questions you have with your health care provider.                   Return To Work _____________________Paul Kelley_________________________ was treated at our facility. INJURY OR ILLNESS WAS: _____ Work-related __X___ Not work-related _____ Undetermined if work-related RETURN TO WORK  Employee may return to work on: _______08/15/2016_____________  Employee may return to modified work on: _______08/15/2016_____________ [employer] Work activities not tolerated include: _____ Bending _____ Prolonged sitting __X___ Lifting- no lifting over 5lbs with left hand untill 10/01/2014- no restrictions after 10/01/2014 _____ Squatting _____ Prolonged standing _____ Lesle Reek _____ Reaching _____ Pushing and pulling _____ Walking _____ Other ____________________ Show this Return to Work statement to your supervisor at work as soon as possible. Your employer should be aware of your condition and can help with the necessary work activity restrictions. If you wish to return to work sooner than the date above, or if you have further problems which make it difficult for you to return at that time, please call us or your caregiver. ____________Peter Martinique MD _____________________________ Physician Name (Printed) _________________________________________ Physician Signature     ____________08/12/2016_____________________________ Date Document Released: 01/31/2005 Document Revised: 04/25/2011 Document Reviewed: 07/18/2006 [employer] Patient Information 2015 [place], [place]. This information is not intended to replace advice  given to you by your health care provider. Make sure you discuss any questions you have with your health care provider.

## 2014-09-26 NOTE — H&P (View-Only) (Signed)
CARDIOLOGY OFFICE NOTE  Date:  09/15/2014    [name] Date of Birth: Aug 31, 1950 Medical Record #[id number]  PCP:  Tula Nakayama  Cardiologist:  Nahser    Chief Complaint  Patient presents with  . Fatigue    One month check - seen for Dr. Acie Fredrickson    History of Present Illness: CJ EDGELL is a 64 y.o. male who presents today for a one month check. Seen for Dr. Acie Fredrickson. Former patient of Dr. Susa Simmonds. Has known prior NSTEMI and stenting of the LAD in 2011. Underwent repeat cath in June of 2013 - stent was patent - has known totally occluded RCA that fills by collaterals - managed medically. Other issues include tobacco abuse, PVD with past iliac stenting, HTN, HLD, DM and CLL .  I saw him back in December. Stable cardiac status. Still smoking but had plan in place to stop.   I saw him last month - he was more fatigued and "no energy". Some rare chest pain and had used NTG. I cut his metoprolol back. He was still smoking.  Comes back today. Here alone. He is really no better. No worsening of chest pain. Just has no energy and feels "like something is wrong". Continues to smoke - really wants to stop but has been unsuccessful - wants to try the patches again. Chantix was "like taking sugar pills". Not short of breath. Not dizzy. Walks a lot at work. Does not report much of a chest pain syndrome prior to his last stent until the "last moment" which he wants to avoid.   Past Medical History  Diagnosis Date  . 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 Hyperlipidemia   . Tobacco abuse   . PVD (peripheral vascular disease)   . CAD (coronary artery disease)   . Anxiety 01/30/2014  . Diabetes mellitus     Type 2   . GERD (gastroesophageal reflux disease)     takes Nexium if needed  . Neuromuscular disorder     peripheral neuropathy  . 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 ECRB (extensor carpi radialis brevis) tenosynovitis    . Lateral epicondylitis of left elbow     Past Surgical History  Procedure Laterality Date  . Femoral stents    . Coronary stent placement  May 2011  . Cholecystectomy  2007  . Carpel tunnel release Left [id number]  . Carpel tunnel release  Right 01-1989  . Tarsal tunnel release Bilateral 08-2007  . Left cai stent/pta and popliteal artery/tibial thrombectomy     . Left heart catheterization with coronary angiogram N/A 08/26/2011    Procedure: LEFT HEART CATHETERIZATION WITH CORONARY ANGIOGRAM;  Surgeon: Peter M Martinique, MD;  Location: [facility] CATH LAB;  Service: Cardiovascular;  Laterality: N/A;  . Cardiac catheterization    . Adenoidectomy  1955  . Lateral epicondyle release Left 02/12/2014    Procedure: LEFT ELBOW DEBRIDEMENT WITH TENDON REPAIR ;  Surgeon: Lorn Junes, MD;  Location: [facility];  Service: Orthopedics;  Laterality: Left;     Medications: Current Outpatient Prescriptions  Medication Sig Dispense Refill  . acyclovir (ZOVIRAX) 400 MG tablet Take 1 tablet (400 mg total) by mouth daily. 90 tablet 4  . ALPRAZolam (XANAX) 0.25 MG tablet Take 1 po q 6 hours prn for anxiety 60 tablet 0  . aspirin 325 MG tablet Take 325 mg by mouth daily.      Marland Kitchen  COMBIVENT RESPIMAT 20-100 MCG/ACT AERS respimat 1 puff every 6 (six) hours as needed.   0  . cyanocobalamin (,VITAMIN B-12,) 1000 MCG/ML injection Inject 1 mL (1,000 mcg total) into the muscle every 30 (thirty) days. 3 mL 3  . esomeprazole (NEXIUM) 40 MG capsule Take 1 capsule (40 mg total) by mouth 2 (two) times daily. 180 capsule 3  . fluticasone (FLOVENT HFA) 110 MCG/ACT inhaler Inhale 1 puff into the lungs as needed. 1 Inhaler 1  . lidocaine-prilocaine (EMLA) cream Apply 1 application topically as needed. 30 g 0  . meloxicam (MOBIC) 15 MG tablet Take 15 mg by mouth daily.    . metFORMIN (GLUCOPHAGE-XR) 500 MG 24 hr tablet Take 500 mg by mouth 2 (two) times daily.    . metoprolol succinate (TOPROL XL) 25 MG 24 hr tablet Take 1 tablet (25  mg total) by mouth daily. 90 tablet 3  . nitroGLYCERIN (NITROSTAT) 0.4 MG SL tablet Place 1 tablet (0.4 mg total) under the tongue every 5 (five) minutes as needed for chest pain. 90 tablet 3  . pregabalin (LYRICA) 75 MG capsule 1 cap three times daily and 2 caps at bedtime 450 capsule 1  . rosuvastatin (CRESTOR) 5 MG tablet Take 1 tablet (5 mg total) by mouth daily. 90 tablet 3  . sildenafil (VIAGRA) 100 MG tablet Take 1 tablet (100 mg total) by mouth as needed. 10 tablet 6  . tetrahydrozoline 0.05 % ophthalmic solution Place 1 drop into both eyes as needed. Red eyes    . topiramate (TOPAMAX) 25 MG tablet Take 1 tablet (25 mg total) by mouth 2 (two) times daily. 180 tablet 3  . zolpidem (AMBIEN) 10 MG tablet Take 10 mg by mouth at bedtime as needed for sleep.   0  . NICODERM CQ 21 MG/24HR patch Place 1 patch (21 mg total) onto the skin daily. 30 patch 1   No current facility-administered medications for this visit.   Facility-Administered Medications Ordered in Other Visits  Medication Dose Route Frequency Provider Last Rate Last Dose  . 0.9 %  sodium chloride infusion   Intravenous Continuous [name], MD 50 mL/hr at 03/07/14 1005    . acetaminophen (TYLENOL) tablet 650 mg  650 mg Oral Q6H PRN [name], MD   650 mg at 03/07/14 1012  . sodium chloride 0.9 % injection 10 mL  10 mL Intracatheter PRN Consuela Mimes, MD   10 mL at 08/22/12 1721  . sodium chloride 0.9 % injection 10 mL  10 mL Intravenous PRN Consuela Mimes, MD   10 mL at 05/29/13 1323    Allergies: Allergies  Allergen Reactions  . Codeine Hives    Pt states he can take a few, more reaction with extended doses.    Social History: The patient  reports that he has been smoking Cigarettes.  He has a 22 pack-year smoking history. He has quit using smokeless tobacco. He reports that he does not drink alcohol or use illicit drugs.   Family History: The patient's family history includes Cancer in his mother; Heart disease in his  father; Heart failure (age of onset: 25) in his father; Lung cancer (age of onset: 37) in his mother.   Review of Systems: Please see the history of present illness.   Otherwise, the review of systems is positive for fatigue.   All other systems are reviewed and negative.   Physical Exam: VS:  BP 124/72 mmHg  Pulse 59  Resp 18  Ht 6' (  1.829 m)  Wt 225 lb (102.059 kg)  BMI 30.51 kg/m2  SpO2 96% .  BMI Body mass index is 30.51 kg/(m^2).  Wt Readings from Last 3 Encounters:  09/15/14 225 lb (102.059 kg)  08/15/14 228 lb (103.42 kg)  07/10/14 229 lb 6.4 oz (104.055 kg)    General: Pleasant. Well developed, well nourished and in no acute distress.  HEENT: Normal. Neck: Supple, no JVD, carotid bruits, or masses noted.  Cardiac: Regular rate and rhythm. No murmurs, rubs, or gallops. No edema.  Respiratory:  Lungs are clear to auscultation bilaterally with normal work of breathing.  GI: Soft and nontender.  MS: No deformity or atrophy. Gait and ROM intact. Skin: Warm and dry. Color is normal.  Neuro:  Strength and sensation are intact and no gross focal deficits noted.  Psych: Alert, appropriate and with normal affect.   LABORATORY DATA:  EKG:  EKG is not ordered today.  Lab Results  Component Value Date   WBC 6.2 07/04/2014   HGB 16.2 07/04/2014   HCT 45.3 07/04/2014   PLT 83* 07/04/2014   GLUCOSE 210* 08/15/2014   CHOL 134 08/15/2014   TRIG 151.0* 08/15/2014   HDL 29.10* 08/15/2014   LDLDIRECT 117.6 08/22/2011   LDLCALC 75 08/15/2014   ALT 24 08/15/2014   AST 19 08/15/2014   NA 138 08/15/2014   K 4.2 08/15/2014   CL 105 08/15/2014   CREATININE 0.93 08/15/2014   BUN 12 08/15/2014   CO2 25 08/15/2014   TSH 2.00 08/15/2014   INR 1.00 02/03/2014   HGBA1C 7.5* 07/26/2013    BNP (last 3 results) No results for input(s): BNP in the last 8760 hours.  ProBNP (last 3 results) No results for input(s): PROBNP in the last 8760 hours.   Other Studies Reviewed  Today:  Coronary angiography:  Coronary dominance: right  Left mainstem: Normal.  Left anterior descending (LAD): The stent in the proximal LAD is widely patent throughout. There is moderate 20% narrowing in the LAD proximal to the stent. The remainder of the vessels without significant disease.  Left circumflex (LCx): The left circumflex gives rise to 2 large marginal branches. There is 20% narrowing prior to the takeoff of the first OM.  Right coronary artery (RCA): The right coronary is a dominant vessel. It has diffuse 70% disease in the proximal to mid vessel. Is occluded at the crux. There are excellent right to right and left to right collaterals.  Left ventriculography: Left ventricular systolic function is normal, LVEF is estimated at 55-65%, there is no significant mitral regurgitation  Final Conclusions:  1. Single vessel obstructive coronary disease. 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 The stent in the proximal LAD is widely patent.  2. Normal LV function.  Recommendations: Continue medical management.  Peter Martinique  08/26/2011, 9:09 AM   Assessment / Plan: 1. CAD - prior PCI back in 2011 - last cath from 3 years ago - continues to endorse strong feeling of fatigue - clearly a change in his symptoms over the past 6 months. No real change with cutting back his beta blocker. Has known totally occluded RCA with collaterals. I think a Myoview will be abnormal. Will proceed on with cardiac cath. The patient understands that risks include but are not limited to stroke (1 in 1000), death (1 in 31), kidney failure [usually temporary] (1 in 500), bleeding (1 in 200), allergic reaction [possibly serious] (1 in 200), and agrees to  proceed. Scheduled for Friday August 12th with Dr. Martinique. Lab on August 8th.   2. HTN - BP is great - no change in therapy -I have left him on his current regimen for now.   3. HLD - on statin therapy    4. ED - uses Viagra  5. Tobacco abuse - continues to struggle with stopping. Trying the patches again - refill sent in. Cessation encouraged.   6. CLL/chronic thrombocytopenia - followed by oncology/hematology.   Current medicines are reviewed with the patient today.  The patient does not have concerns regarding medicines other than what has been noted above.  The following changes have been made:  See above.  Labs/ tests ordered today include:    Orders Placed This Encounter  Procedures  . Basic metabolic panel  . CBC  . Protime-INR  . APTT     Disposition:   Further disposition to follow.   Patient is agreeable to this plan and will call if any problems develop in the interim.   Signed: Burtis Junes, RN, ANP-C 09/15/2014 9:05 AM  [facility] [address] [place], [place]  [address] Phone: [phone] Fax: [phone]

## 2014-09-26 NOTE — Interval H&P Note (Signed)
History and Physical Interval Note:  09/26/2014 8:54 AM  Ryan Copeland  has presented today for surgery, with the diagnosis of fatigue  The various methods of treatment have been discussed with the patient and family. After consideration of risks, benefits and other options for treatment, the patient has consented to  Procedure(s): Left Heart Cath and Coronary Angiography (N/A) as a surgical intervention .  The patient's history has been reviewed, patient examined, no change in status, stable for surgery.  I have reviewed the patient's chart and labs.  Questions were answered to the patient's satisfaction.    Cath Lab Visit (complete for each Cath Lab visit)  Clinical Evaluation Leading to the Procedure:   ACS: No.  Non-ACS:    Anginal Classification: CCS II  Anti-ischemic medical therapy: Minimal Therapy (1 class of medications)  Non-Invasive Test Results: No non-invasive testing performed  Prior CABG: No previous CABG       Ryan Copeland [facility] 09/26/2014 8:55 AM

## 2014-09-29 MED FILL — Heparin Sodium (Porcine) 2 Unit/ML in Sodium Chloride 0.9%: INTRAMUSCULAR | Qty: 500 | Status: AC

## 2014-09-29 MED FILL — Lidocaine HCl Local Preservative Free (PF) Inj 1%: INTRAMUSCULAR | Qty: 30 | Status: AC

## 2014-09-29 MED FILL — Verapamil HCl IV Soln 2.5 MG/ML: INTRAVENOUS | Qty: 2 | Status: AC

## 2014-09-29 MED FILL — Heparin Sodium (Porcine) Inj 1000 Unit/ML: INTRAMUSCULAR | Qty: 10 | Status: AC

## 2014-10-01 ENCOUNTER — Telehealth: Payer: Self-pay | Admitting: *Deleted

## 2014-10-01 NOTE — Telephone Encounter (Signed)
Patient called aback and I have moved his appt

## 2014-10-01 NOTE — Telephone Encounter (Signed)
Late entry---I called and left the patient a message yesterday that we needed to move 8/19 appt to 9am. Asked patient to call office back

## 2014-10-03 ENCOUNTER — Ambulatory Visit (HOSPITAL_BASED_OUTPATIENT_CLINIC_OR_DEPARTMENT_OTHER): Payer: BLUE CROSS/BLUE SHIELD

## 2014-10-03 VITALS — BP 134/61 | HR 60 | Temp 98.7°F | Resp 18

## 2014-10-03 DIAGNOSIS — D801 Nonfamilial hypogammaglobulinemia: Secondary | ICD-10-CM

## 2014-10-03 DIAGNOSIS — C9110 Chronic lymphocytic leukemia of B-cell type not having achieved remission: Secondary | ICD-10-CM | POA: Diagnosis not present

## 2014-10-03 MED ORDER — DIPHENHYDRAMINE HCL 25 MG PO CAPS
ORAL_CAPSULE | ORAL | Status: AC
  Filled 2014-10-03: qty 2

## 2014-10-03 MED ORDER — ACETAMINOPHEN 325 MG PO TABS
ORAL_TABLET | ORAL | Status: AC
  Filled 2014-10-03: qty 2

## 2014-10-03 MED ORDER — HEPARIN SOD (PORK) LOCK FLUSH 100 UNIT/ML IV SOLN
500.0000 [IU] | Freq: Once | INTRAVENOUS | Status: AC
  Administered 2014-10-03: 500 [IU] via INTRAVENOUS
  Filled 2014-10-03: qty 5

## 2014-10-03 MED ORDER — SODIUM CHLORIDE 0.9 % IJ SOLN
10.0000 mL | INTRAMUSCULAR | Status: DC | PRN
  Administered 2014-10-03: 10 mL via INTRAVENOUS
  Filled 2014-10-03: qty 10

## 2014-10-03 MED ORDER — IMMUNE GLOBULIN (HUMAN) 10 GM/100ML IV SOLN
0.5000 g/kg | Freq: Once | INTRAVENOUS | Status: AC
  Administered 2014-10-03: 50 g via INTRAVENOUS
  Filled 2014-10-03: qty 500

## 2014-10-03 MED ORDER — DIPHENHYDRAMINE HCL 25 MG PO TABS
25.0000 mg | ORAL_TABLET | Freq: Once | ORAL | Status: AC
  Administered 2014-10-03: 25 mg via ORAL
  Filled 2014-10-03: qty 1

## 2014-10-03 MED ORDER — SODIUM CHLORIDE 0.9 % IV SOLN
Freq: Once | INTRAVENOUS | Status: AC
  Administered 2014-10-03: 09:00:00 via INTRAVENOUS

## 2014-10-03 MED ORDER — ACETAMINOPHEN 325 MG PO TABS
650.0000 mg | ORAL_TABLET | Freq: Four times a day (QID) | ORAL | Status: DC | PRN
  Administered 2014-10-03: 650 mg via ORAL

## 2014-10-03 NOTE — Patient Instructions (Signed)

## 2014-10-31 ENCOUNTER — Ambulatory Visit (HOSPITAL_BASED_OUTPATIENT_CLINIC_OR_DEPARTMENT_OTHER): Payer: BLUE CROSS/BLUE SHIELD

## 2014-10-31 VITALS — BP 116/61 | HR 56 | Temp 97.8°F | Resp 16

## 2014-10-31 DIAGNOSIS — C9110 Chronic lymphocytic leukemia of B-cell type not having achieved remission: Secondary | ICD-10-CM | POA: Diagnosis not present

## 2014-10-31 DIAGNOSIS — D801 Nonfamilial hypogammaglobulinemia: Secondary | ICD-10-CM

## 2014-10-31 MED ORDER — HEPARIN SOD (PORK) LOCK FLUSH 100 UNIT/ML IV SOLN
500.0000 [IU] | INTRAVENOUS | Status: AC | PRN
  Administered 2014-10-31: 500 [IU]
  Filled 2014-10-31: qty 5

## 2014-10-31 MED ORDER — SODIUM CHLORIDE 0.9 % IJ SOLN
10.0000 mL | INTRAMUSCULAR | Status: AC | PRN
  Administered 2014-10-31: 10 mL
  Filled 2014-10-31: qty 10

## 2014-10-31 MED ORDER — SODIUM CHLORIDE 0.9 % IJ SOLN
10.0000 mL | INTRAMUSCULAR | Status: DC | PRN
  Filled 2014-10-31: qty 10

## 2014-10-31 MED ORDER — DIPHENHYDRAMINE HCL 25 MG PO CAPS
ORAL_CAPSULE | ORAL | Status: AC
  Filled 2014-10-31: qty 1

## 2014-10-31 MED ORDER — DIPHENHYDRAMINE HCL 25 MG PO TABS
25.0000 mg | ORAL_TABLET | Freq: Once | ORAL | Status: AC
  Administered 2014-10-31: 25 mg via ORAL
  Filled 2014-10-31: qty 1

## 2014-10-31 MED ORDER — ACETAMINOPHEN 325 MG PO TABS
650.0000 mg | ORAL_TABLET | Freq: Four times a day (QID) | ORAL | Status: DC | PRN
  Administered 2014-10-31: 650 mg via ORAL

## 2014-10-31 MED ORDER — ACETAMINOPHEN 325 MG PO TABS
ORAL_TABLET | ORAL | Status: AC
  Filled 2014-10-31: qty 2

## 2014-10-31 MED ORDER — HEPARIN SOD (PORK) LOCK FLUSH 100 UNIT/ML IV SOLN
250.0000 [IU] | INTRAVENOUS | Status: DC | PRN
  Filled 2014-10-31: qty 5

## 2014-10-31 MED ORDER — IMMUNE GLOBULIN (HUMAN) 10 GM/100ML IV SOLN
50.0000 g | Freq: Once | INTRAVENOUS | Status: AC
  Administered 2014-10-31: 50 g via INTRAVENOUS
  Filled 2014-10-31: qty 500

## 2014-10-31 NOTE — Patient Instructions (Signed)

## 2014-11-02 ENCOUNTER — Other Ambulatory Visit: Payer: Self-pay | Admitting: Hematology and Oncology

## 2014-11-11 ENCOUNTER — Ambulatory Visit (INDEPENDENT_AMBULATORY_CARE_PROVIDER_SITE_OTHER): Payer: BLUE CROSS/BLUE SHIELD | Admitting: Nurse Practitioner

## 2014-11-11 ENCOUNTER — Encounter: Payer: Self-pay | Admitting: Nurse Practitioner

## 2014-11-11 VITALS — BP 150/80 | HR 64 | Ht 72.0 in | Wt 220.8 lb

## 2014-11-11 DIAGNOSIS — Z9889 Other specified postprocedural states: Secondary | ICD-10-CM

## 2014-11-11 DIAGNOSIS — I2510 Atherosclerotic heart disease of native coronary artery without angina pectoris: Secondary | ICD-10-CM | POA: Diagnosis not present

## 2014-11-11 DIAGNOSIS — I10 Essential (primary) hypertension: Secondary | ICD-10-CM

## 2014-11-11 DIAGNOSIS — R5383 Other fatigue: Secondary | ICD-10-CM | POA: Diagnosis not present

## 2014-11-11 MED ORDER — NICODERM CQ 21 MG/24HR TD PT24: 21.0000 mg | MEDICATED_PATCH | Freq: Every day | TRANSDERMAL | Status: DC

## 2014-11-11 NOTE — Patient Instructions (Addendum)
We will be checking the following labs today - NONE   Medication Instructions:    Continue with your current medicines.     Testing/Procedures To Be Arranged:  N/A  Follow-Up:   See me in 6 months with fasting labs    Other Special Instructions:   Work on the smoking!! I refilled the patches for you today  Call the [place] office at [phone] if you have any questions, problems or concerns.

## 2014-11-11 NOTE — Progress Notes (Signed)
CARDIOLOGY OFFICE NOTE  Date:  11/11/2014    Ryan Copeland Date of Birth: 12-01-1950 Medical Record #[id number]  PCP:  Tula Nakayama  Cardiologist:  Nahser    Chief Complaint  Patient presents with  . FU post cardiac cath    Seen for Dr. Acie Fredrickson    History of Present Illness: Ryan Copeland is a 64 y.o. male who presents today for a post cardiac cath visit. Seen for Dr. Acie Fredrickson. Former patient of Dr. Susa Simmonds. Has known prior NSTEMI and stenting of the LAD in 2011. Underwent repeat cath in June of 2013 - stent was patent - has known totally occluded RCA that fills by collaterals - managed medically. Other issues include tobacco abuse, PVD with past iliac stenting, HTN, HLD, DM, chronic thrombocytopenia and CLL .  Over the past few months he has become more fatigued and "no energy". Some rare chest pain and had used NTG. I cut his metoprolol back. He was still smoking. He really did not improve - we ended up repeating his cardiac cath - this was stable.   Comes back today. Here alone. Doing ok. No problems post cath. BP up a little today. Feels ok. No chest pain.  Continues to smoke. He wants to stop - has tried many times - just can't make it work. Chantix was "like a sugar pill". He uses the patches. Has been to the classes as well. Wife continues to smoke also. He remains somewhat fatigued but trying to stay active.   Past Medical History  Diagnosis Date  . 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 Hyperlipidemia   . Tobacco abuse   . PVD (peripheral vascular disease)   . CAD (coronary artery disease)   . Anxiety 01/30/2014  . Diabetes mellitus     Type 2   . GERD (gastroesophageal reflux disease)     takes Nexium if needed  . Neuromuscular disorder     peripheral neuropathy  . 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 ECRB (extensor carpi radialis brevis) tenosynovitis   . Lateral epicondylitis of left elbow     Past  Surgical History  Procedure Laterality Date  . Femoral stents    . Coronary stent placement  May 2011  . Cholecystectomy  2007  . Carpel tunnel release Left [id number]  . Carpel tunnel release  Right 01-1989  . Tarsal tunnel release Bilateral 08-2007  . Left cai stent/pta and popliteal artery/tibial thrombectomy     . Left heart catheterization with coronary angiogram N/A 08/26/2011    Procedure: LEFT HEART CATHETERIZATION WITH CORONARY ANGIOGRAM;  Surgeon: Peter M Martinique, MD;  Location: [facility] CATH LAB;  Service: Cardiovascular;  Laterality: N/A;  . Cardiac catheterization    . Adenoidectomy  1955  . Lateral epicondyle release Left 02/12/2014    Procedure: LEFT ELBOW DEBRIDEMENT WITH TENDON REPAIR ;  Surgeon: Lorn Junes, MD;  Location: [facility];  Service: Orthopedics;  Laterality: Left;  . Cardiac catheterization N/A 09/26/2014    Procedure: Left Heart Cath and Coronary Angiography;  Surgeon: Peter M Martinique, MD;  Location: [facility] CV LAB;  Service: Cardiovascular;  Laterality: N/A;     Medications: Current Outpatient Prescriptions  Medication Sig Dispense Refill  . acetaminophen (TYLENOL) 500 MG tablet Take 500 mg by mouth every 6 (six) hours as needed for mild pain, moderate pain or headache.    Marland Kitchen acyclovir (ZOVIRAX) 400 MG tablet Take  1 tablet (400 mg total) by mouth daily. 90 tablet 4  . ALPRAZolam (XANAX) 0.25 MG tablet Take 1 po q 6 hours prn for anxiety (Patient taking differently: Take 0.25 mg by mouth 2 (two) times daily as needed for anxiety. ) 60 tablet 0  . aspirin 325 MG tablet Take 325 mg by mouth daily.      . COMBIVENT RESPIMAT 20-100 MCG/ACT AERS respimat 1 puff every 6 (six) hours as needed for wheezing or shortness of breath.   0  . cyanocobalamin (,VITAMIN B-12,) 1000 MCG/ML injection Inject 1 mL (1,000 mcg total) into the muscle every 30 (thirty) days. 3 mL 3  . esomeprazole (NEXIUM) 40 MG capsule Take 1 capsule (40 mg total) by mouth 2 (two) times daily. 180 capsule 3    . fluticasone (FLOVENT HFA) 110 MCG/ACT inhaler Inhale 1 puff into the lungs as needed. (Patient taking differently: Inhale 1 puff into the lungs as needed (shortness of breath). ) 1 Inhaler 1  . lidocaine-prilocaine (EMLA) cream APPLY 1 APPLICATION TOPICALLY AS NEEDED 30 g 0  . metFORMIN (GLUCOPHAGE-XR) 500 MG 24 hr tablet Take 500 mg by mouth 2 (two) times daily.    . metoprolol succinate (TOPROL XL) 25 MG 24 hr tablet Take 1 tablet (25 mg total) by mouth daily. 90 tablet 3  . NICODERM CQ 21 MG/24HR patch Place 1 patch (21 mg total) onto the skin daily. 30 patch 2  . nitroGLYCERIN (NITROSTAT) 0.4 MG SL tablet Place 1 tablet (0.4 mg total) under the tongue every 5 (five) minutes as needed for chest pain. 90 tablet 3  . pregabalin (LYRICA) 75 MG capsule 1 cap three times daily and 2 caps at bedtime (Patient taking differently: Take 75-150 mg by mouth 4 (four) times daily. 1 cap three times daily and 2 caps at bedtime) 450 capsule 1  . rosuvastatin (CRESTOR) 5 MG tablet Take 1 tablet (5 mg total) by mouth daily. 90 tablet 3  . sildenafil (VIAGRA) 100 MG tablet Take 1 tablet (100 mg total) by mouth as needed. (Patient taking differently: Take 100 mg by mouth as needed for erectile dysfunction. ) 10 tablet 6  . tetrahydrozoline 0.05 % ophthalmic solution Place 1 drop into both eyes as needed. Red eyes    . topiramate (TOPAMAX) 25 MG tablet Take 1 tablet (25 mg total) by mouth 2 (two) times daily. 180 tablet 3  . zolpidem (AMBIEN) 10 MG tablet Take 10 mg by mouth at bedtime as needed for sleep.   0   No current facility-administered medications for this visit.   Facility-Administered Medications Ordered in Other Visits  Medication Dose Route Frequency Provider Last Rate Last Dose  . 0.9 %  sodium chloride infusion   Intravenous Continuous [name], MD 50 mL/hr at 03/07/14 1005    . acetaminophen (TYLENOL) tablet 650 mg  650 mg Oral Q6H PRN [name], MD   650 mg at 03/07/14 1012  . sodium  chloride 0.9 % injection 10 mL  10 mL Intracatheter PRN Consuela Mimes, MD   10 mL at 08/22/12 1721  . sodium chloride 0.9 % injection 10 mL  10 mL Intravenous PRN Consuela Mimes, MD   10 mL at 05/29/13 1323    Allergies: Allergies  Allergen Reactions  . Codeine Hives    Pt states he can take a few, more reaction with extended doses.    Social History: The patient  reports that he has been smoking Cigarettes.  He has a  22 pack-year smoking history. He has quit using smokeless tobacco. He reports that he does not drink alcohol or use illicit drugs.   Family History: The patient's family history includes Cancer in his mother; Heart disease in his father; Heart failure (age of onset: 74) in his father; Lung cancer (age of onset: 74) in his mother.   Review of Systems: Please see the history of present illness.   Otherwise, the review of systems is positive for back pain and anxiety.   All other systems are reviewed and negative.   Physical Exam: VS:  BP 150/80 mmHg  Pulse 64  Ht 6' (1.829 m)  Wt 220 lb 12.8 oz (100.154 kg)  BMI 29.94 kg/m2  SpO2 94% .  BMI Body mass index is 29.94 kg/(m^2).  Wt Readings from Last 3 Encounters:  11/11/14 220 lb 12.8 oz (100.154 kg)  09/26/14 215 lb (97.523 kg)  09/15/14 225 lb (102.059 kg)   BP is 120/80 by me today  General: Pleasant. Well developed, well nourished and in no acute distress. He has gained about 5 pounds.  HEENT: Normal. Neck: Supple, no JVD, carotid bruits, or masses noted.  Cardiac: Regular rate and rhythm. No murmurs, rubs, or gallops. No edema.  Respiratory:  Lungs are clear to auscultation bilaterally with normal work of breathing.  GI: Soft and nontender.  MS: No deformity or atrophy. Gait and ROM intact. Skin: Warm and dry. Color is normal.  Neuro:  Strength and sensation are intact and no gross focal deficits noted.  Psych: Alert, appropriate and with normal affect. Left wrist looks fine.   LABORATORY DATA:  EKG:  EKG  is not ordered today.   Lab Results  Component Value Date   WBC 7.0 09/22/2014   HGB 16.0 09/22/2014   HCT 46.6 09/22/2014   PLT 98.0* 09/22/2014   GLUCOSE 252* 09/22/2014   CHOL 134 08/15/2014   TRIG 151.0* 08/15/2014   HDL 29.10* 08/15/2014   LDLDIRECT 117.6 08/22/2011   LDLCALC 75 08/15/2014   ALT 24 08/15/2014   AST 19 08/15/2014   NA 136 09/22/2014   K 3.8 09/22/2014   CL 103 09/22/2014   CREATININE 0.98 09/22/2014   BUN 15 09/22/2014   CO2 26 09/22/2014   TSH 2.00 08/15/2014   INR 1.0 09/22/2014   HGBA1C 7.5* 07/26/2013    BNP (last 3 results) No results for input(s): BNP in the last 8760 hours.  ProBNP (last 3 results) No results for input(s): PROBNP in the last 8760 hours.   Other Studies Reviewed Today:  Coronary angiography:  Coronary dominance: right  Left mainstem: Normal.  Left anterior descending (LAD): The stent in the proximal LAD is widely patent throughout. There is moderate 20% narrowing in the LAD proximal to the stent. The remainder of the vessels without significant disease.  Left circumflex (LCx): The left circumflex gives rise to 2 large marginal branches. There is 20% narrowing prior to the takeoff of the first OM.  Right coronary artery (RCA): The right coronary is a dominant vessel. It has diffuse 70% disease in the proximal to mid vessel. Is occluded at the crux. There are excellent right to right and left to right collaterals.  Left ventriculography: Left ventricular systolic function is normal, LVEF is estimated at 55-65%, there is no significant mitral regurgitation  Final Conclusions:  1. Single vessel obstructive coronary disease. 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 The stent in the proximal LAD is widely  patent.  2. Normal LV function.  Recommendations: Continue medical management.  Peter Martinique  08/26/2011, 9:09 AM   Assessment / Plan: 1. CAD - recent cath with stable  findings. Continue with medical management.   2. HTN - repeat BP by me is fine. He monitors at home.   3. HLD - on statin therapy   4. ED - uses Viagra  5. Tobacco abuse - continues to struggle with stopping. Trying the patches again - refill sent in. Cessation encouraged. Smoking cessation is encouraged. 3 minutes were spent at today's visit discussing smoking cessation - reasons to stop, ways to quit, benefits, etc.   6. CLL/chronic thrombocytopenia - followed by oncology/hematology.    Current medicines are reviewed with the patient today.  The patient does not have concerns regarding medicines other than what has been noted above.  The following changes have been made:  See above.  Labs/ tests ordered today include:   No orders of the defined types were placed in this encounter.     Disposition:   FU with me in 6 months with fasting labs.    Patient is agreeable to this plan and will call if any problems develop in the interim.   Signed: Burtis Junes, RN, ANP-C 11/11/2014 9:46 AM  [facility] [address] [place], [name]  [address] Phone: [phone] Fax: [phone]

## 2014-12-05 ENCOUNTER — Ambulatory Visit (HOSPITAL_BASED_OUTPATIENT_CLINIC_OR_DEPARTMENT_OTHER): Payer: BLUE CROSS/BLUE SHIELD

## 2014-12-05 VITALS — BP 130/67 | HR 60 | Temp 97.7°F | Resp 17

## 2014-12-05 DIAGNOSIS — C9110 Chronic lymphocytic leukemia of B-cell type not having achieved remission: Secondary | ICD-10-CM

## 2014-12-05 DIAGNOSIS — D801 Nonfamilial hypogammaglobulinemia: Secondary | ICD-10-CM | POA: Diagnosis not present

## 2014-12-05 MED ORDER — SODIUM CHLORIDE 0.9 % IJ SOLN
10.0000 mL | INTRAMUSCULAR | Status: DC | PRN
  Administered 2014-12-05: 10 mL via INTRAVENOUS
  Filled 2014-12-05: qty 10

## 2014-12-05 MED ORDER — DIPHENHYDRAMINE HCL 25 MG PO CAPS
ORAL_CAPSULE | ORAL | Status: AC
Start: 2014-12-05 — End: 2014-12-05
  Filled 2014-12-05: qty 1

## 2014-12-05 MED ORDER — IMMUNE GLOBULIN (HUMAN) 10 GM/100ML IV SOLN
50.0000 g | Freq: Once | INTRAVENOUS | Status: AC
  Administered 2014-12-05: 50 g via INTRAVENOUS
  Filled 2014-12-05: qty 500

## 2014-12-05 MED ORDER — SODIUM CHLORIDE 0.9 % IV SOLN
Freq: Once | INTRAVENOUS | Status: AC
  Administered 2014-12-05: 08:00:00 via INTRAVENOUS

## 2014-12-05 MED ORDER — ACETAMINOPHEN 325 MG PO TABS
ORAL_TABLET | ORAL | Status: AC
  Filled 2014-12-05: qty 2

## 2014-12-05 MED ORDER — DIPHENHYDRAMINE HCL 25 MG PO TABS
25.0000 mg | ORAL_TABLET | Freq: Once | ORAL | Status: AC
  Administered 2014-12-05: 25 mg via ORAL
  Filled 2014-12-05: qty 1

## 2014-12-05 MED ORDER — ACETAMINOPHEN 325 MG PO TABS
650.0000 mg | ORAL_TABLET | Freq: Four times a day (QID) | ORAL | Status: DC | PRN
  Administered 2014-12-05: 650 mg via ORAL

## 2014-12-05 MED ORDER — HEPARIN SOD (PORK) LOCK FLUSH 100 UNIT/ML IV SOLN
500.0000 [IU] | Freq: Once | INTRAVENOUS | Status: AC
  Administered 2014-12-05: 500 [IU] via INTRAVENOUS
  Filled 2014-12-05: qty 5

## 2014-12-05 NOTE — Progress Notes (Signed)
Pt monitored 30 minutes post IVIG infusion. Pt and VS stable at time of discharge.

## 2014-12-05 NOTE — Patient Instructions (Signed)

## 2014-12-11 NOTE — Telephone Encounter (Signed)
Error

## 2014-12-25 ENCOUNTER — Encounter: Payer: Self-pay | Admitting: Family

## 2014-12-29 ENCOUNTER — Ambulatory Visit (INDEPENDENT_AMBULATORY_CARE_PROVIDER_SITE_OTHER)
Admission: RE | Admit: 2014-12-29 | Discharge: 2014-12-29 | Disposition: A | Discharge status: Home / Self Care | Payer: BLUE CROSS/BLUE SHIELD | Source: Ambulatory Visit | Attending: Surgery | Admitting: Surgery

## 2014-12-29 ENCOUNTER — Other Ambulatory Visit: Payer: Self-pay | Admitting: Surgery

## 2014-12-29 ENCOUNTER — Ambulatory Visit (INDEPENDENT_AMBULATORY_CARE_PROVIDER_SITE_OTHER): Payer: BLUE CROSS/BLUE SHIELD | Admitting: Family

## 2014-12-29 ENCOUNTER — Ambulatory Visit (HOSPITAL_COMMUNITY)
Admission: RE | Admit: 2014-12-29 | Discharge: 2014-12-29 | Disposition: A | Discharge status: Home / Self Care | Payer: BLUE CROSS/BLUE SHIELD | Source: Ambulatory Visit | Attending: Family | Admitting: Family

## 2014-12-29 ENCOUNTER — Encounter: Payer: Self-pay | Admitting: Family

## 2014-12-29 VITALS — BP 126/73 | HR 59 | Temp 97.1°F | Resp 14 | Ht 72.0 in | Wt 220.0 lb

## 2014-12-29 DIAGNOSIS — Z95828 Presence of other vascular implants and grafts: Secondary | ICD-10-CM

## 2014-12-29 DIAGNOSIS — Z720 Tobacco use: Secondary | ICD-10-CM

## 2014-12-29 DIAGNOSIS — Z4889 Encounter for other specified surgical aftercare: Secondary | ICD-10-CM

## 2014-12-29 DIAGNOSIS — F17200 Nicotine dependence, unspecified, uncomplicated: Secondary | ICD-10-CM

## 2014-12-29 DIAGNOSIS — I779 Disorder of arteries and arterioles, unspecified: Secondary | ICD-10-CM

## 2014-12-29 DIAGNOSIS — I739 Peripheral vascular disease, unspecified: Secondary | ICD-10-CM

## 2014-12-29 NOTE — Patient Instructions (Signed)

## 2014-12-29 NOTE — Progress Notes (Signed)
[facility] [place] HISTORY AND PHYSICAL -PAD  History of Present Illness Ryan Copeland is a 64 y.o. malepatient of Dr. Trula Slade comes in today for followup. 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 He is a former patient of Dr. Amedeo Plenty. Dr. Amedeo Plenty did this last intervention in June of 2010. He used a 10 x 40 Powerflex balloon.   The patient suffers from CLL. He has not had any changes in his symptoms or disease. Dr. Trula Slade also evaluated him for pulsatile tinnitus with a CT scan as well as referral to ENT. He still has these problems but no identifiable cause has been found. From a neurovascular standpoint, he denies any symptoms such as numbness or weakness in either extremity, slurred speech, or amaurosis fugax. He continues to take a statin for hypercholesterolemia.   Carotid ultrasound in November of 2015 showed widely patent bilateral carotid arteries. He denies tingling, numbness, pain, or cold sensation in either hand/arm.  2-3 months ago he started having left hip pain with walking, resolves with rest, feels this is worsening slightly.  He denies any known OA problems or lumbar spine issues.  He denies any non healing wounds.   He has known neuropathy in his feet which he states is from past B12 deficiency, he is taking B12 now and Lyrica for the sx's.   The patient denies New Medical or Surgical History.  He just lost his job last week, states budget cuts.   Pt Diabetic: Yes, states "mild" Pt smoker: smoker  (1/2 ppd, started at age17 yrs). He has decreased his smoking, is wearing nicotine patches.  Pt meds include: Statin :Yes Betablocker: Yes ASA: yes Other anticoagulants/antiplatelets: no  Past Medical History  Diagnosis Date  . 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 Hyperlipidemia   . Tobacco abuse   . PVD (peripheral vascular disease)   .  CAD (coronary artery disease)   . Anxiety 01/30/2014  . Diabetes mellitus     Type 2   . GERD (gastroesophageal reflux disease)     takes Nexium if needed  . Neuromuscular disorder     peripheral neuropathy  . 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 ECRB (extensor carpi radialis brevis) tenosynovitis   . Lateral epicondylitis of left elbow     Social History Social History  Substance Use Topics  . Smoking status: Current Every Day Smoker -- 0.50 packs/day for 44 years    Types: Cigarettes  . Smokeless tobacco: Former Systems developer  . Alcohol Use: No     Comment:  drinks non-alcoholic beer    Family History Family History  Problem Relation Age of Onset  . Lung cancer Mother 54  . Cancer Mother     lung  . Heart failure Father 29  . Heart disease Father     Past Surgical History  Procedure Laterality Date  . Femoral stents    . Coronary stent placement  May 2011  . Cholecystectomy  2007  . Carpel tunnel release Left [id number]  . Carpel tunnel release  Right 01-1989  . Tarsal tunnel release Bilateral 08-2007  . Left cai stent/pta and popliteal artery/tibial thrombectomy     . Left heart catheterization with coronary angiogram N/A 08/26/2011    Procedure: LEFT HEART CATHETERIZATION WITH CORONARY ANGIOGRAM;  Surgeon: Peter M Martinique, MD;  Location: [facility] CATH LAB;  Service: Cardiovascular;  Laterality: N/A;  . Cardiac catheterization    . Adenoidectomy  1955  . Lateral epicondyle release Left 02/12/2014    Procedure: LEFT ELBOW DEBRIDEMENT WITH TENDON REPAIR ;  Surgeon: Lorn Junes, MD;  Location: [place];  Service: Orthopedics;  Laterality: Left;  . Cardiac catheterization N/A 09/26/2014    Procedure: Left Heart Cath and Coronary Angiography;  Surgeon: Peter M Martinique, MD;  Location: [place] CV LAB;  Service: Cardiovascular;  Laterality: N/A;    Allergies  Allergen Reactions  . Codeine Hives    Pt states he can take a few, more reaction with extended doses.    Current  Outpatient Prescriptions  Medication Sig Dispense Refill  . acetaminophen (TYLENOL) 500 MG tablet Take 500 mg by mouth every 6 (six) hours as needed for mild pain, moderate pain or headache.    Marland Kitchen acyclovir (ZOVIRAX) 400 MG tablet Take 1 tablet (400 mg total) by mouth daily. 90 tablet 4  . ALPRAZolam (XANAX) 0.25 MG tablet Take 1 po q 6 hours prn for anxiety (Patient taking differently: Take 0.25 mg by mouth 2 (two) times daily as needed for anxiety. ) 60 tablet 0  . aspirin 325 MG tablet Take 325 mg by mouth daily.      . COMBIVENT RESPIMAT 20-100 MCG/ACT AERS respimat 1 puff every 6 (six) hours as needed for wheezing or shortness of breath.   0  . cyanocobalamin (,VITAMIN B-12,) 1000 MCG/ML injection Inject 1 mL (1,000 mcg total) into the muscle every 30 (thirty) days. 3 mL 3  . esomeprazole (NEXIUM) 40 MG capsule Take 1 capsule (40 mg total) by mouth 2 (two) times daily. 180 capsule 3  . fluticasone (FLOVENT HFA) 110 MCG/ACT inhaler Inhale 1 puff into the lungs as needed. (Patient taking differently: Inhale 1 puff into the lungs as needed (shortness of breath). ) 1 Inhaler 1  . lidocaine-prilocaine (EMLA) cream APPLY 1 APPLICATION TOPICALLY AS NEEDED 30 g 0  . metFORMIN (GLUCOPHAGE-XR) 500 MG 24 hr tablet Take 500 mg by mouth 2 (two) times daily.    . metoprolol succinate (TOPROL XL) 25 MG 24 hr tablet Take 1 tablet (25 mg total) by mouth daily. 90 tablet 3  . NICODERM CQ 21 MG/24HR patch Place 1 patch (21 mg total) onto the skin daily. 30 patch 2  . nitroGLYCERIN (NITROSTAT) 0.4 MG SL tablet Place 1 tablet (0.4 mg total) under the tongue every 5 (five) minutes as needed for chest pain. 90 tablet 3  . pregabalin (LYRICA) 75 MG capsule 1 cap three times daily and 2 caps at bedtime (Patient taking differently: Take 75-150 mg by mouth 4 (four) times daily. 1 cap three times daily and 2 caps at bedtime) 450 capsule 1  . rosuvastatin (CRESTOR) 5 MG tablet Take 1 tablet (5 mg total) by mouth daily. 90  tablet 3  . sildenafil (VIAGRA) 100 MG tablet Take 1 tablet (100 mg total) by mouth as needed. (Patient taking differently: Take 100 mg by mouth as needed for erectile dysfunction. ) 10 tablet 6  . tetrahydrozoline 0.05 % ophthalmic solution Place 1 drop into both eyes as needed. Red eyes    . topiramate (TOPAMAX) 25 MG tablet Take 1 tablet (25 mg total) by mouth 2 (two) times daily. 180 tablet 3  . zolpidem (AMBIEN) 10 MG tablet Take 10 mg by mouth at bedtime as needed for sleep.   0   No current facility-administered medications for this visit.   Facility-Administered Medications Ordered  in Other Visits  Medication Dose Route Frequency Provider Last Rate Last Dose  . 0.9 %  sodium chloride infusion   Intravenous Continuous [name], MD 50 mL/hr at 03/07/14 1005    . acetaminophen (TYLENOL) tablet 650 mg  650 mg Oral Q6H PRN [name], MD   650 mg at 03/07/14 1012  . sodium chloride 0.9 % injection 10 mL  10 mL Intracatheter PRN Consuela Mimes, MD   10 mL at 08/22/12 1721  . sodium chloride 0.9 % injection 10 mL  10 mL Intravenous PRN Consuela Mimes, MD   10 mL at 05/29/13 1323    ROS: See HPI for pertinent positives and negatives.   Physical Examination  Filed Vitals:   12/29/14 0949  BP: 126/73  Pulse: 59  Temp: 97.1 F (36.2 C)  TempSrc: Oral  Resp: 14  Height: 6' (1.829 m)  Weight: 220 lb (99.791 kg)  SpO2: 99%   Body mass index is 29.83 kg/(m^2).  General: A&O x 3, WDWN. Gait: normal Eyes: PERRLA. Pulmonary: limited air movement in all fields, no wheezes, rales or rhonchi. Cardiac: bradycardic regular rythm, no detected murmur.         Carotid Bruits Right Left   Negative Negative  Aorta is not palpable. Radial pulses: right radial pulse is not palpable, right ulnar pulse is not palpable, right brachial pulse is 3+ palpable; left radial pulse is 2+ palpable                           VASCULAR EXAM: Extremities without ischemic changes, without Gangrene; without  open wounds.                                                                                                          LE Pulses Right Left       FEMORAL  3+ palpable  3+ palpable        POPLITEAL  2+ palpable   2+ palpable       POSTERIOR TIBIAL  not palpable   1+ palpable        DORSALIS PEDIS      ANTERIOR TIBIAL 1+ palpable  2+ palpable    Abdomen: soft, NT, no palpable masses. Skin: no rashes, no ulcers. Musculoskeletal: no muscle wasting or atrophy.  Neurologic: A&O X 3; Appropriate Affect, MOTOR FUNCTION:  moving all extremities equally, motor strength 5/5 throughout. Speech is fluent/normal.  CN 2-12 intact.    Non-Invasive Vascular Imaging: DATE: 12/29/2014 LOWER EXTREMITY ARTERIAL DUPLEX EVALUATION    INDICATION: PVD    PREVIOUS INTERVENTION(S): Left CIA sent/PTA and popliteal artery/tibial thrombectomy 08/06/2008    DUPLEX EXAM: Lower extremity arterial duplex    RIGHT  LEFT   Peak Systolic Velocity (cm/s) Ratio (if abnormal) Waveform  Peak Systolic Velocity (cm/s) Ratio (if abnormal) Waveform  135  B-T Common Femoral Artery 128  T  126  T Deep Femoral Artery 106  T  80  B Superficial Femoral Artery Proximal 108  T  171  T  Superficial Femoral Artery Mid 129  T  158  T Superficial Femoral Artery Distal 74  T  66  T Popliteal Artery 63  B  63  B Posterior Tibial Artery Dist 47  B  34  B Anterior Tibial Artery Distal 29  B  40  B Peroneal Artery Distal 87  B  1.1 Today's ABI / TBI 1.3  1.2 Previous ABI / TBI ( 12/23/13 ) 1.2    Waveform:    M - Monophasic       B - Biphasic       T - Triphasic  If Ankle Brachial Index (ABI) or Toe Brachial Index (TBI) performed, please see complete report     ADDITIONAL FINDINGS: Left common iliac artery stent: origin 151 cm/s, proximally 169 cm/s, mid 147 cm/s, distally 148 cm/s, end stent 169 cm/s, distal to stent 121 cm/s, all waveforms biphasic    IMPRESSION: 1 Less than 50% stenosis visualized bilaterally, no stenosis  visualized. 2. Patent left common iliac artery stent    Compared to the previous exam:  No prior exam     ASSESSMENT: Ryan Copeland is a 64 y.o. male who is s/p left common iliac stenting which has required balloon angioplasty for in-stent stenosis in June of 2010. He also had a left popliteal thrombectomy at that time. He started having left buttock claudication, resolves with rest,  about 2-3 months ago which is worsening slightly; this is the same symptom he experienced before he required left iliac artery stenting.  He has no signs of ischemia in his feet/legs.  Today's iliac artery sten duplex suggestsl ess than 50% stenosis visualized bilaterally, no stenosis visualized; patent left common iliac artery stent. ABI's remain normal with all triphasic waveforms except biphasic in left PT. Left pedal pulses are more palpable than right pedal pulses.  He states his DM is "mild". Unfortunately he continues to smoke, but is wearing nicotine patch and has decreased cigarette use in attempt to quit. This has proved more difficult due to the added stressor of him losing his job last week.  Face to face time with patient was 25 minutes. Over 50% of this time was spent on counseling and coordination of care.  PLAN:  Based on the patient's vascular studies and examination, and after discussing with Dr. Trula Slade,  pt will be scheduled for arteriogram with Dr. Trula Slade, access left leg, symptomatic left hip in the last 2-3 months, claudication in left hip is worsening.   The patient was counseled re smoking cessation and given several free resources re smoking cessation.  I discussed in depth with the patient the nature of atherosclerosis, and emphasized the importance of maximal medical management including strict control of blood pressure, blood glucose, and lipid levels, obtaining regular exercise, and cessation of smoking.  The patient is aware that without maximal medical management the underlying  atherosclerotic disease process will progress, limiting the benefit of any interventions.  The patient was given information about PAD including signs, symptoms, treatment, what symptoms should prompt the patient to seek immediate medical care, and risk reduction measures to take.  Clemon Chambers, RN, MSN, FNP-C [facility] Phone: [phone]  Clinic MD: Trula Slade  12/29/2014 9:03 AM

## 2014-12-30 ENCOUNTER — Encounter: Payer: Self-pay | Admitting: Nurse Practitioner

## 2014-12-30 ENCOUNTER — Other Ambulatory Visit: Payer: Self-pay

## 2014-12-30 NOTE — Telephone Encounter (Signed)
Next appt is 06/2015 (yearly).

## 2014-12-31 ENCOUNTER — Telehealth: Payer: Self-pay | Admitting: Nurse Practitioner

## 2014-12-31 NOTE — Telephone Encounter (Signed)
Got an email from patient wanting to increase Lyrica. He needs to come in for an appointment I  do not feel comfortable increasing his Lyrica any further. We need to discuss other options. Please call and schedule

## 2014-12-31 NOTE — Telephone Encounter (Signed)
I called pt and made appt for him for 01-06-15 at 1030.  He was appreciative.  Having increasing pain with his PN.

## 2015-01-01 ENCOUNTER — Ambulatory Visit (HOSPITAL_COMMUNITY)
Admission: RE | Admit: 2015-01-01 | Discharge: 2015-01-01 | Disposition: A | Discharge status: Home / Self Care | Payer: BLUE CROSS/BLUE SHIELD | Source: Ambulatory Visit | Attending: Vascular Surgery | Admitting: Vascular Surgery

## 2015-01-01 ENCOUNTER — Encounter (HOSPITAL_COMMUNITY)
Admission: RE | Disposition: A | Discharge status: Home / Self Care | Payer: BLUE CROSS/BLUE SHIELD | Source: Ambulatory Visit | Attending: Vascular Surgery

## 2015-01-01 ENCOUNTER — Encounter (HOSPITAL_COMMUNITY): Payer: Self-pay | Admitting: Vascular Surgery

## 2015-01-01 DIAGNOSIS — F419 Anxiety disorder, unspecified: Secondary | ICD-10-CM | POA: Insufficient documentation

## 2015-01-01 DIAGNOSIS — K219 Gastro-esophageal reflux disease without esophagitis: Secondary | ICD-10-CM | POA: Insufficient documentation

## 2015-01-01 DIAGNOSIS — Z7982 Long term (current) use of aspirin: Secondary | ICD-10-CM | POA: Diagnosis not present

## 2015-01-01 DIAGNOSIS — I2510 Atherosclerotic heart disease of native coronary artery without angina pectoris: Secondary | ICD-10-CM | POA: Diagnosis not present

## 2015-01-01 DIAGNOSIS — E1142 Type 2 diabetes mellitus with diabetic polyneuropathy: Secondary | ICD-10-CM | POA: Diagnosis not present

## 2015-01-01 DIAGNOSIS — Z7984 Long term (current) use of oral hypoglycemic drugs: Secondary | ICD-10-CM | POA: Insufficient documentation

## 2015-01-01 DIAGNOSIS — I70212 Atherosclerosis of native arteries of extremities with intermittent claudication, left leg: Secondary | ICD-10-CM | POA: Diagnosis present

## 2015-01-01 DIAGNOSIS — E1151 Type 2 diabetes mellitus with diabetic peripheral angiopathy without gangrene: Secondary | ICD-10-CM | POA: Diagnosis not present

## 2015-01-01 DIAGNOSIS — Z955 Presence of coronary angioplasty implant and graft: Secondary | ICD-10-CM | POA: Diagnosis not present

## 2015-01-01 DIAGNOSIS — C9110 Chronic lymphocytic leukemia of B-cell type not having achieved remission: Secondary | ICD-10-CM | POA: Diagnosis not present

## 2015-01-01 DIAGNOSIS — E7800 Pure hypercholesterolemia, unspecified: Secondary | ICD-10-CM | POA: Insufficient documentation

## 2015-01-01 DIAGNOSIS — E538 Deficiency of other specified B group vitamins: Secondary | ICD-10-CM | POA: Diagnosis not present

## 2015-01-01 DIAGNOSIS — Z95820 Peripheral vascular angioplasty status with implants and grafts: Secondary | ICD-10-CM | POA: Diagnosis not present

## 2015-01-01 DIAGNOSIS — I739 Peripheral vascular disease, unspecified: Secondary | ICD-10-CM | POA: Diagnosis present

## 2015-01-01 DIAGNOSIS — I252 Old myocardial infarction: Secondary | ICD-10-CM | POA: Diagnosis not present

## 2015-01-01 DIAGNOSIS — I743 Embolism and thrombosis of arteries of the lower extremities: Secondary | ICD-10-CM | POA: Diagnosis not present

## 2015-01-01 DIAGNOSIS — F17210 Nicotine dependence, cigarettes, uncomplicated: Secondary | ICD-10-CM | POA: Insufficient documentation

## 2015-01-01 HISTORY — PX: PERIPHERAL VASCULAR CATHETERIZATION: SHX172C

## 2015-01-01 LAB — POCT I-STAT, CHEM 8
BUN: 11 mg/dL (ref 6–20)
CREATININE: 0.8 mg/dL (ref 0.61–1.24)
Calcium, Ion: 1.22 mmol/L (ref 1.13–1.30)
Chloride: 101 mmol/L (ref 101–111)
Glucose, Bld: 215 mg/dL — ABNORMAL HIGH (ref 65–99)
HEMATOCRIT: 52 % (ref 39.0–52.0)
HEMOGLOBIN: 17.7 g/dL — AB (ref 13.0–17.0)
POTASSIUM: 4.3 mmol/L (ref 3.5–5.1)
SODIUM: 141 mmol/L (ref 135–145)
TCO2: 28 mmol/L (ref 0–100)

## 2015-01-01 LAB — GLUCOSE, CAPILLARY: Glucose-Capillary: 148 mg/dL — ABNORMAL HIGH (ref 65–99)

## 2015-01-01 SURGERY — ABDOMINAL AORTOGRAM
Anesthesia: LOCAL

## 2015-01-01 MED ORDER — OXYCODONE-ACETAMINOPHEN 5-325 MG PO TABS
ORAL_TABLET | ORAL | Status: AC
  Filled 2015-01-01: qty 2

## 2015-01-01 MED ORDER — OXYCODONE-ACETAMINOPHEN 5-325 MG PO TABS
2.0000 | ORAL_TABLET | Freq: Once | ORAL | Status: AC
  Administered 2015-01-01: 2 via ORAL

## 2015-01-01 MED ORDER — FENTANYL CITRATE (PF) 100 MCG/2ML IJ SOLN
INTRAMUSCULAR | Status: AC
  Filled 2015-01-01: qty 2

## 2015-01-01 MED ORDER — HEPARIN (PORCINE) IN NACL 2-0.9 UNIT/ML-% IJ SOLN
INTRAMUSCULAR | Status: AC
  Filled 2015-01-01: qty 1000

## 2015-01-01 MED ORDER — MIDAZOLAM HCL 2 MG/2ML IJ SOLN
INTRAMUSCULAR | Status: AC
  Filled 2015-01-01: qty 2

## 2015-01-01 MED ORDER — MIDAZOLAM HCL 2 MG/2ML IJ SOLN
INTRAMUSCULAR | Status: DC | PRN
  Administered 2015-01-01: 1 mg via INTRAVENOUS

## 2015-01-01 MED ORDER — FENTANYL CITRATE (PF) 100 MCG/2ML IJ SOLN
INTRAMUSCULAR | Status: DC | PRN
  Administered 2015-01-01: 50 ug via INTRAVENOUS

## 2015-01-01 MED ORDER — ACETAMINOPHEN 325 MG PO TABS: 650.0000 mg | ORAL_TABLET | ORAL | Status: DC | PRN

## 2015-01-01 MED ORDER — SODIUM CHLORIDE 0.9 % IV SOLN: 1.0000 mL/kg/h | INTRAVENOUS | Status: DC

## 2015-01-01 MED ORDER — SODIUM CHLORIDE 0.9 % IV SOLN: INTRAVENOUS | Status: DC

## 2015-01-01 MED ORDER — LIDOCAINE HCL (PF) 1 % IJ SOLN
INTRAMUSCULAR | Status: AC
  Filled 2015-01-01: qty 30

## 2015-01-01 SURGICAL SUPPLY — 10 items
CATH OMNI FLUSH 5F 65CM (CATHETERS) ×2 IMPLANT
COVER PRB 48X5XTLSCP FOLD TPE (BAG) ×1 IMPLANT
COVER PROBE 5X48 (BAG) ×1
KIT MICROINTRODUCER STIFF 5F (SHEATH) ×2 IMPLANT
KIT PV (KITS) ×2 IMPLANT
SHEATH PINNACLE 5F 10CM (SHEATH) ×2 IMPLANT
SYR MEDRAD MARK V 150ML (SYRINGE) ×2 IMPLANT
TRANSDUCER W/STOPCOCK (MISCELLANEOUS) ×2 IMPLANT
TRAY PV CATH (CUSTOM PROCEDURE TRAY) ×2 IMPLANT
WIRE BENTSON .035X145CM (WIRE) ×2 IMPLANT

## 2015-01-01 NOTE — H&P (View-Only) (Signed)
[facility] [place] HISTORY AND PHYSICAL -PAD  History of Present Illness Ryan Copeland is a 64 y.o. malepatient of Dr. Trula Slade comes in today for followup. 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 He is a former patient of Dr. Amedeo Plenty. Dr. Amedeo Plenty did this last intervention in June of 2010. He used a 10 x 40 Powerflex balloon.   The patient suffers from CLL. He has not had any changes in his symptoms or disease. Dr. Trula Slade also evaluated him for pulsatile tinnitus with a CT scan as well as referral to ENT. He still has these problems but no identifiable cause has been found. From a neurovascular standpoint, he denies any symptoms such as numbness or weakness in either extremity, slurred speech, or amaurosis fugax. He continues to take a statin for hypercholesterolemia.   Carotid ultrasound in November of 2015 showed widely patent bilateral carotid arteries. He denies tingling, numbness, pain, or cold sensation in either hand/arm.  2-3 months ago he started having left hip pain with walking, resolves with rest, feels this is worsening slightly.  He denies any known OA problems or lumbar spine issues.  He denies any non healing wounds.   He has known neuropathy in his feet which he states is from past B12 deficiency, he is taking B12 now and Lyrica for the sx's.   The patient denies New Medical or Surgical History.  He just lost his job last week, states budget cuts.   Pt Diabetic: Yes, states "mild" Pt smoker: smoker  (1/2 ppd, started at age17 yrs). He has decreased his smoking, is wearing nicotine patches.  Pt meds include: Statin :Yes Betablocker: Yes ASA: yes Other anticoagulants/antiplatelets: no  Past Medical History  Diagnosis Date  . 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 Hyperlipidemia   . Tobacco abuse   . PVD (peripheral vascular disease)   .  CAD (coronary artery disease)   . Anxiety 01/30/2014  . Diabetes mellitus     Type 2   . GERD (gastroesophageal reflux disease)     takes Nexium if needed  . Neuromuscular disorder     peripheral neuropathy  . 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 ECRB (extensor carpi radialis brevis) tenosynovitis   . Lateral epicondylitis of left elbow     Social History Social History  Substance Use Topics  . Smoking status: Current Every Day Smoker -- 0.50 packs/day for 44 years    Types: Cigarettes  . Smokeless tobacco: Former Systems developer  . Alcohol Use: No     Comment:  drinks non-alcoholic beer    Family History Family History  Problem Relation Age of Onset  . Lung cancer Mother 71  . Cancer Mother     lung  . Heart failure Father 6  . Heart disease Father     Past Surgical History  Procedure Laterality Date  . Femoral stents    . Coronary stent placement  May 2011  . Cholecystectomy  2007  . Carpel tunnel release Left [id number]  . Carpel tunnel release  Right 01-1989  . Tarsal tunnel release Bilateral 08-2007  . Left cai stent/pta and popliteal artery/tibial thrombectomy     . Left heart catheterization with coronary angiogram N/A 08/26/2011    Procedure: LEFT HEART CATHETERIZATION WITH CORONARY ANGIOGRAM;  Surgeon: Peter M Martinique, MD;  Location: [facility] CATH LAB;  Service: Cardiovascular;  Laterality: N/A;  . Cardiac catheterization    . Adenoidectomy  1955  . Lateral epicondyle release Left 02/12/2014    Procedure: LEFT ELBOW DEBRIDEMENT WITH TENDON REPAIR ;  Surgeon: Lorn Junes, MD;  Location: [place];  Service: Orthopedics;  Laterality: Left;  . Cardiac catheterization N/A 09/26/2014    Procedure: Left Heart Cath and Coronary Angiography;  Surgeon: Peter M Martinique, MD;  Location: [place] CV LAB;  Service: Cardiovascular;  Laterality: N/A;    Allergies  Allergen Reactions  . Codeine Hives    Pt states he can take a few, more reaction with extended doses.    Current  Outpatient Prescriptions  Medication Sig Dispense Refill  . acetaminophen (TYLENOL) 500 MG tablet Take 500 mg by mouth every 6 (six) hours as needed for mild pain, moderate pain or headache.    Marland Kitchen acyclovir (ZOVIRAX) 400 MG tablet Take 1 tablet (400 mg total) by mouth daily. 90 tablet 4  . ALPRAZolam (XANAX) 0.25 MG tablet Take 1 po q 6 hours prn for anxiety (Patient taking differently: Take 0.25 mg by mouth 2 (two) times daily as needed for anxiety. ) 60 tablet 0  . aspirin 325 MG tablet Take 325 mg by mouth daily.      . COMBIVENT RESPIMAT 20-100 MCG/ACT AERS respimat 1 puff every 6 (six) hours as needed for wheezing or shortness of breath.   0  . cyanocobalamin (,VITAMIN B-12,) 1000 MCG/ML injection Inject 1 mL (1,000 mcg total) into the muscle every 30 (thirty) days. 3 mL 3  . esomeprazole (NEXIUM) 40 MG capsule Take 1 capsule (40 mg total) by mouth 2 (two) times daily. 180 capsule 3  . fluticasone (FLOVENT HFA) 110 MCG/ACT inhaler Inhale 1 puff into the lungs as needed. (Patient taking differently: Inhale 1 puff into the lungs as needed (shortness of breath). ) 1 Inhaler 1  . lidocaine-prilocaine (EMLA) cream APPLY 1 APPLICATION TOPICALLY AS NEEDED 30 g 0  . metFORMIN (GLUCOPHAGE-XR) 500 MG 24 hr tablet Take 500 mg by mouth 2 (two) times daily.    . metoprolol succinate (TOPROL XL) 25 MG 24 hr tablet Take 1 tablet (25 mg total) by mouth daily. 90 tablet 3  . NICODERM CQ 21 MG/24HR patch Place 1 patch (21 mg total) onto the skin daily. 30 patch 2  . nitroGLYCERIN (NITROSTAT) 0.4 MG SL tablet Place 1 tablet (0.4 mg total) under the tongue every 5 (five) minutes as needed for chest pain. 90 tablet 3  . pregabalin (LYRICA) 75 MG capsule 1 cap three times daily and 2 caps at bedtime (Patient taking differently: Take 75-150 mg by mouth 4 (four) times daily. 1 cap three times daily and 2 caps at bedtime) 450 capsule 1  . rosuvastatin (CRESTOR) 5 MG tablet Take 1 tablet (5 mg total) by mouth daily. 90  tablet 3  . sildenafil (VIAGRA) 100 MG tablet Take 1 tablet (100 mg total) by mouth as needed. (Patient taking differently: Take 100 mg by mouth as needed for erectile dysfunction. ) 10 tablet 6  . tetrahydrozoline 0.05 % ophthalmic solution Place 1 drop into both eyes as needed. Red eyes    . topiramate (TOPAMAX) 25 MG tablet Take 1 tablet (25 mg total) by mouth 2 (two) times daily. 180 tablet 3  . zolpidem (AMBIEN) 10 MG tablet Take 10 mg by mouth at bedtime as needed for sleep.   0   No current facility-administered medications for this visit.   Facility-Administered Medications Ordered  in Other Visits  Medication Dose Route Frequency Provider Last Rate Last Dose  . 0.9 %  sodium chloride infusion   Intravenous Continuous [name], MD 50 mL/hr at 03/07/14 1005    . acetaminophen (TYLENOL) tablet 650 mg  650 mg Oral Q6H PRN [name], MD   650 mg at 03/07/14 1012  . sodium chloride 0.9 % injection 10 mL  10 mL Intracatheter PRN Consuela Mimes, MD   10 mL at 08/22/12 1721  . sodium chloride 0.9 % injection 10 mL  10 mL Intravenous PRN Consuela Mimes, MD   10 mL at 05/29/13 1323    ROS: See HPI for pertinent positives and negatives.   Physical Examination  Filed Vitals:   12/29/14 0949  BP: 126/73  Pulse: 59  Temp: 97.1 F (36.2 C)  TempSrc: Oral  Resp: 14  Height: 6' (1.829 m)  Weight: 220 lb (99.791 kg)  SpO2: 99%   Body mass index is 29.83 kg/(m^2).  General: A&O x 3, WDWN. Gait: normal Eyes: PERRLA. Pulmonary: limited air movement in all fields, no wheezes, rales or rhonchi. Cardiac: bradycardic regular rythm, no detected murmur.         Carotid Bruits Right Left   Negative Negative  Aorta is not palpable. Radial pulses: right radial pulse is not palpable, right ulnar pulse is not palpable, right brachial pulse is 3+ palpable; left radial pulse is 2+ palpable                           VASCULAR EXAM: Extremities without ischemic changes, without Gangrene; without  open wounds.                                                                                                          LE Pulses Right Left       FEMORAL  3+ palpable  3+ palpable        POPLITEAL  2+ palpable   2+ palpable       POSTERIOR TIBIAL  not palpable   1+ palpable        DORSALIS PEDIS      ANTERIOR TIBIAL 1+ palpable  2+ palpable    Abdomen: soft, NT, no palpable masses. Skin: no rashes, no ulcers. Musculoskeletal: no muscle wasting or atrophy.  Neurologic: A&O X 3; Appropriate Affect, MOTOR FUNCTION:  moving all extremities equally, motor strength 5/5 throughout. Speech is fluent/normal.  CN 2-12 intact.    Non-Invasive Vascular Imaging: DATE: 12/29/2014 LOWER EXTREMITY ARTERIAL DUPLEX EVALUATION    INDICATION: PVD    PREVIOUS INTERVENTION(S): Left CIA sent/PTA and popliteal artery/tibial thrombectomy 08/06/2008    DUPLEX EXAM: Lower extremity arterial duplex    RIGHT  LEFT   Peak Systolic Velocity (cm/s) Ratio (if abnormal) Waveform  Peak Systolic Velocity (cm/s) Ratio (if abnormal) Waveform  135  B-T Common Femoral Artery 128  T  126  T Deep Femoral Artery 106  T  80  B Superficial Femoral Artery Proximal 108  T  171  T  Superficial Femoral Artery Mid 129  T  158  T Superficial Femoral Artery Distal 74  T  66  T Popliteal Artery 63  B  63  B Posterior Tibial Artery Dist 47  B  34  B Anterior Tibial Artery Distal 29  B  40  B Peroneal Artery Distal 87  B  1.1 Today's ABI / TBI 1.3  1.2 Previous ABI / TBI ( 12/23/13 ) 1.2    Waveform:    M - Monophasic       B - Biphasic       T - Triphasic  If Ankle Brachial Index (ABI) or Toe Brachial Index (TBI) performed, please see complete report     ADDITIONAL FINDINGS: Left common iliac artery stent: origin 151 cm/s, proximally 169 cm/s, mid 147 cm/s, distally 148 cm/s, end stent 169 cm/s, distal to stent 121 cm/s, all waveforms biphasic    IMPRESSION: 1 Less than 50% stenosis visualized bilaterally, no stenosis  visualized. 2. Patent left common iliac artery stent    Compared to the previous exam:  No prior exam     ASSESSMENT: ASAF ELMQUIST is a 64 y.o. male who is s/p left common iliac stenting which has required balloon angioplasty for in-stent stenosis in June of 2010. He also had a left popliteal thrombectomy at that time. He started having left buttock claudication, resolves with rest,  about 2-3 months ago which is worsening slightly; this is the same symptom he experienced before he required left iliac artery stenting.  He has no signs of ischemia in his feet/legs.  Today's iliac artery sten duplex suggestsl ess than 50% stenosis visualized bilaterally, no stenosis visualized; patent left common iliac artery stent. ABI's remain normal with all triphasic waveforms except biphasic in left PT. Left pedal pulses are more palpable than right pedal pulses.  He states his DM is "mild". Unfortunately he continues to smoke, but is wearing nicotine patch and has decreased cigarette use in attempt to quit. This has proved more difficult due to the added stressor of him losing his job last week.  Face to face time with patient was 25 minutes. Over 50% of this time was spent on counseling and coordination of care.  PLAN:  Based on the patient's vascular studies and examination, and after discussing with Dr. Trula Slade,  pt will be scheduled for arteriogram with Dr. Trula Slade, access left leg, symptomatic left hip in the last 2-3 months, claudication in left hip is worsening.   The patient was counseled re smoking cessation and given several free resources re smoking cessation.  I discussed in depth with the patient the nature of atherosclerosis, and emphasized the importance of maximal medical management including strict control of blood pressure, blood glucose, and lipid levels, obtaining regular exercise, and cessation of smoking.  The patient is aware that without maximal medical management the underlying  atherosclerotic disease process will progress, limiting the benefit of any interventions.  The patient was given information about PAD including signs, symptoms, treatment, what symptoms should prompt the patient to seek immediate medical care, and risk reduction measures to take.  Clemon Chambers, RN, MSN, FNP-C [facility] Phone: [phone]  Clinic MD: Trula Slade  12/29/2014 9:03 AM

## 2015-01-01 NOTE — Interval H&P Note (Signed)
[facility] [place]  History and Physical Update  The patient was interviewed and re-examined.  The patient's previous History and Physical has been reviewed and is unchanged from my NP's consult.  There is no change in the plan of care: aortogram, left leg runoff, possible intervention.  I discussed with the patient the nature of angiographic procedures, especially the limited patencies of any endovascular intervention.  The patient is aware of that the risks of an angiographic procedure include but are not limited to: bleeding, infection, access site complications, renal failure, embolization, rupture of vessel, dissection, possible need for emergent surgical intervention, possible need for surgical procedures to treat the patient's pathology, anaphylactic reaction to contrast, and stroke and death.  The patient is aware of the risks and agrees to proceed.   Adele Barthel, MD [facility] [place] Office: [phone] Pager: [phone]  01/01/2015, 8:58 AM

## 2015-01-01 NOTE — Discharge Instructions (Signed)
Angiogram, Care After °Refer to this sheet in the next few weeks. These instructions provide you with information about caring for yourself after your procedure. Your health care provider may also give you more specific instructions. Your treatment has been planned according to current medical practices, but problems sometimes occur. Call your health care provider if you have any problems or questions after your procedure. °WHAT TO EXPECT AFTER THE PROCEDURE °After your procedure, it is typical to have the following: °· Bruising at the catheter insertion site that usually fades within 1-2 weeks. °· Blood collecting in the tissue (hematoma) that may be painful to the touch. It should usually decrease in size and tenderness within 1-2 weeks. °HOME CARE INSTRUCTIONS °· Take medicines only as directed by your health care provider. °· You may shower 24-48 hours after the procedure or as directed by your health care provider. Remove the bandage (dressing) and gently wash the site with plain soap and water. Pat the area dry with a clean towel. Do not rub the site, because this may cause bleeding. °· Do not take baths, swim, or use a hot tub until your health care provider approves. °· Check your insertion site every day for redness, swelling, or drainage. °· Do not apply powder or lotion to the site. °· Do not lift over 10 lb (4.5 kg) for 5 days after your procedure or as directed by your health care provider. °· Ask your health care provider when it is okay to: °¨ Return to work or school. °¨ Resume usual physical activities or sports. °¨ Resume sexual activity. °· Do not drive home if you are discharged the same day as the procedure. Have someone else drive you. °· You may drive 24 hours after the procedure unless otherwise instructed by your health care provider. °· Do not operate machinery or power tools for 24 hours after the procedure or as directed by your health care provider. °· If your procedure was done as an  outpatient procedure, which means that you went home the same day as your procedure, a responsible adult should be with you for the first 24 hours after you arrive home. °· Keep all follow-up visits as directed by your health care provider. This is important. °SEEK MEDICAL CARE IF: °· You have a fever. °· You have chills. °· You have increased bleeding from the catheter insertion site. Hold pressure on the site. °SEEK IMMEDIATE MEDICAL CARE IF: °· You have unusual pain at the catheter insertion site. °· You have redness, warmth, or swelling at the catheter insertion site. °· You have drainage (other than a small amount of blood on the dressing) from the catheter insertion site. °· The catheter insertion site is bleeding, and the bleeding does not stop after 30 minutes of holding steady pressure on the site. °· The area near or just beyond the catheter insertion site becomes pale, cool, tingly, or numb. °  °This information is not intended to replace advice given to you by your health care provider. Make sure you discuss any questions you have with your health care provider. °  °Document Released: 08/19/2004 Document Revised: 02/21/2014 Document Reviewed: 07/04/2012 °Elsevier Interactive Patient Education ©2016 Elsevier Inc. ° °

## 2015-01-01 NOTE — Progress Notes (Signed)
Site area: Left groin  A 5 french sheath was removed  Site Prior to Removal:  Level 0  Pressure Applied For 20 MINUTES    Minutes Beginning at 1125am  Manual:   Yes.    Patient Status During Pull:  stable  Post Pull Groin Site:  Level 0  Post Pull Instructions Given:  Yes.    Post Pull Pulses Present:  Yes.    Dressing Applied:  Yes.    Comments:  VS remain stable during sheath pull

## 2015-01-02 ENCOUNTER — Other Ambulatory Visit: Payer: Self-pay | Admitting: Hematology and Oncology

## 2015-01-02 ENCOUNTER — Ambulatory Visit (HOSPITAL_BASED_OUTPATIENT_CLINIC_OR_DEPARTMENT_OTHER): Payer: BLUE CROSS/BLUE SHIELD

## 2015-01-02 ENCOUNTER — Ambulatory Visit (HOSPITAL_BASED_OUTPATIENT_CLINIC_OR_DEPARTMENT_OTHER): Payer: BLUE CROSS/BLUE SHIELD | Admitting: Hematology and Oncology

## 2015-01-02 ENCOUNTER — Telehealth: Payer: Self-pay | Admitting: Hematology and Oncology

## 2015-01-02 ENCOUNTER — Encounter: Payer: Self-pay | Admitting: Hematology and Oncology

## 2015-01-02 ENCOUNTER — Other Ambulatory Visit (HOSPITAL_BASED_OUTPATIENT_CLINIC_OR_DEPARTMENT_OTHER): Payer: BLUE CROSS/BLUE SHIELD

## 2015-01-02 VITALS — BP 120/61 | HR 67 | Temp 98.5°F | Resp 18

## 2015-01-02 VITALS — BP 136/62 | HR 59 | Temp 98.0°F | Resp 16 | Ht 72.0 in | Wt 221.6 lb

## 2015-01-02 DIAGNOSIS — C9110 Chronic lymphocytic leukemia of B-cell type not having achieved remission: Secondary | ICD-10-CM | POA: Diagnosis not present

## 2015-01-02 DIAGNOSIS — D801 Nonfamilial hypogammaglobulinemia: Secondary | ICD-10-CM

## 2015-01-02 DIAGNOSIS — G619 Inflammatory polyneuropathy, unspecified: Secondary | ICD-10-CM

## 2015-01-02 DIAGNOSIS — D696 Thrombocytopenia, unspecified: Secondary | ICD-10-CM

## 2015-01-02 DIAGNOSIS — Z23 Encounter for immunization: Secondary | ICD-10-CM

## 2015-01-02 DIAGNOSIS — Z720 Tobacco use: Secondary | ICD-10-CM

## 2015-01-02 DIAGNOSIS — G622 Polyneuropathy due to other toxic agents: Secondary | ICD-10-CM

## 2015-01-02 LAB — CBC WITH DIFFERENTIAL/PLATELET
BASO%: 0.6 % (ref 0.0–2.0)
BASOS ABS: 0 10*3/uL (ref 0.0–0.1)
EOS ABS: 0 10*3/uL (ref 0.0–0.5)
EOS%: 0.3 % (ref 0.0–7.0)
HEMATOCRIT: 49.2 % (ref 38.4–49.9)
HEMOGLOBIN: 16.8 g/dL (ref 13.0–17.1)
LYMPH#: 0.3 10*3/uL — AB (ref 0.9–3.3)
LYMPH%: 4.2 % — ABNORMAL LOW (ref 14.0–49.0)
MCH: 32.8 pg (ref 27.2–33.4)
MCHC: 34.2 g/dL (ref 32.0–36.0)
MCV: 96.1 fL (ref 79.3–98.0)
MONO#: 0.4 10*3/uL (ref 0.1–0.9)
MONO%: 5.8 % (ref 0.0–14.0)
NEUT%: 89.1 % — ABNORMAL HIGH (ref 39.0–75.0)
NEUTROS ABS: 5.9 10*3/uL (ref 1.5–6.5)
Platelets: 95 10*3/uL — ABNORMAL LOW (ref 140–400)
RBC: 5.12 10*6/uL (ref 4.20–5.82)
RDW: 14.4 % (ref 11.0–14.6)
WBC: 6.6 10*3/uL (ref 4.0–10.3)

## 2015-01-02 LAB — COMPREHENSIVE METABOLIC PANEL (CC13)
ALT: 17 U/L (ref 0–55)
ANION GAP: 8 meq/L (ref 3–11)
AST: 14 U/L (ref 5–34)
Albumin: 3.9 g/dL (ref 3.5–5.0)
Alkaline Phosphatase: 98 U/L (ref 40–150)
BILIRUBIN TOTAL: 0.67 mg/dL (ref 0.20–1.20)
BUN: 8.1 mg/dL (ref 7.0–26.0)
CO2: 25 meq/L (ref 22–29)
Calcium: 9.5 mg/dL (ref 8.4–10.4)
Chloride: 106 mEq/L (ref 98–109)
Creatinine: 1 mg/dL (ref 0.7–1.3)
EGFR: 79 mL/min/{1.73_m2} — AB (ref >90)
GLUCOSE: 269 mg/dL — AB (ref 70–140)
POTASSIUM: 4.5 meq/L (ref 3.5–5.1)
SODIUM: 139 meq/L (ref 136–145)
TOTAL PROTEIN: 6.5 g/dL (ref 6.4–8.3)

## 2015-01-02 LAB — LACTATE DEHYDROGENASE (CC13): LDH: 174 U/L (ref 125–245)

## 2015-01-02 MED ORDER — DIPHENHYDRAMINE HCL 25 MG PO TABS
25.0000 mg | ORAL_TABLET | Freq: Once | ORAL | Status: AC
  Administered 2015-01-02: 25 mg via ORAL
  Filled 2015-01-02: qty 1

## 2015-01-02 MED ORDER — SODIUM CHLORIDE 0.9 % IJ SOLN
10.0000 mL | INTRAMUSCULAR | Status: DC | PRN
  Administered 2015-01-02: 10 mL via INTRAVENOUS
  Filled 2015-01-02: qty 10

## 2015-01-02 MED ORDER — ACETAMINOPHEN 325 MG PO TABS
ORAL_TABLET | ORAL | Status: AC
  Filled 2015-01-02: qty 2

## 2015-01-02 MED ORDER — IMMUNE GLOBULIN (HUMAN) 10 GM/100ML IV SOLN
0.5000 g/kg | Freq: Once | INTRAVENOUS | Status: AC
  Administered 2015-01-02: 50 g via INTRAVENOUS
  Filled 2015-01-02: qty 500

## 2015-01-02 MED ORDER — HEPARIN SOD (PORK) LOCK FLUSH 100 UNIT/ML IV SOLN
500.0000 [IU] | Freq: Once | INTRAVENOUS | Status: AC
  Administered 2015-01-02: 500 [IU] via INTRAVENOUS
  Filled 2015-01-02: qty 5

## 2015-01-02 MED ORDER — SODIUM CHLORIDE 0.9 % IV SOLN
Freq: Once | INTRAVENOUS | Status: AC
  Administered 2015-01-02: 10:00:00 via INTRAVENOUS

## 2015-01-02 MED ORDER — DIPHENHYDRAMINE HCL 25 MG PO CAPS
ORAL_CAPSULE | ORAL | Status: AC
  Filled 2015-01-02: qty 1

## 2015-01-02 MED ORDER — ACETAMINOPHEN 325 MG PO TABS
650.0000 mg | ORAL_TABLET | Freq: Four times a day (QID) | ORAL | Status: DC | PRN
  Administered 2015-01-02: 650 mg via ORAL

## 2015-01-02 MED ORDER — PNEUMOCOCCAL 13-VAL CONJ VACC IM SUSP
0.5000 mL | Freq: Once | INTRAMUSCULAR | Status: AC
  Administered 2015-01-02: 0.5 mL via INTRAMUSCULAR
  Filled 2015-01-02: qty 0.5

## 2015-01-02 MED FILL — Heparin Sodium (Porcine) 2 Unit/ML in Sodium Chloride 0.9%: INTRAMUSCULAR | Qty: 500 | Status: AC

## 2015-01-02 MED FILL — Lidocaine HCl Local Preservative Free (PF) Inj 1%: INTRAMUSCULAR | Qty: 30 | Status: AC

## 2015-01-02 NOTE — Assessment & Plan Note (Signed)
This is due to ITP/splenomegaly. He is receiving IVIG. He is not symptomatic. There is no contraindication for him to remain on aspirin as well as the patient had no clinical bleeding.  

## 2015-01-02 NOTE — Assessment & Plan Note (Signed)
This is multi factorial. I will defer to neurologist for management.

## 2015-01-02 NOTE — Assessment & Plan Note (Addendum)
He has significant signs and symptoms of upper respiratory tract congestion since continuation of IVIG. The patient requested to resume therapy which I think is reasonable, to prevent risk of infection. The risk and benefits and side effects of IVIG infusion has been discussed with patient and he agreed to proceed. He has no side effects of IVIG or signs of serum sickness. Plan to continue monthly IVIG indefinitely We discussed the importance of preventive care and reviewed the vaccination programs. He does not have any prior allergic reactions to pneumococcal vaccination. He agrees to proceed with Prevnar 13 vaccination today and we will administer it today at the clinic.

## 2015-01-02 NOTE — Assessment & Plan Note (Signed)
He is motivated to quit smoking and is down to half a pack of cigarettes per day.  I continue to encourage him with his smoking cessation effort.

## 2015-01-02 NOTE — Patient Instructions (Signed)
Immune Globulin Injection What is this medicine? IMMUNE GLOBULIN (im MUNE GLOB yoo lin) helps to prevent or reduce the severity of certain infections in patients who are at risk. This medicine is collected from the pooled blood of many donors. It is used to treat immune system problems, thrombocytopenia, and Kawasaki syndrome. This medicine may be used for other purposes; ask your health care Alcus Bradly or pharmacist if you have questions. What should I tell my health care Rim Thatch before I take this medicine? They need to know if you have any of these conditions: - diabetes - extremely low or no immune antibodies in the blood - heart disease - history of blood clots - hyperprolinemia - infection in the blood, sepsis - kidney disease - taking medicine that may change kidney function - ask your health care Magon Croson about your medicine - an unusual or allergic reaction to human immune globulin, albumin, maltose, sucrose, polysorbate 80, other medicines, foods, dyes, or preservatives - pregnant or trying to get pregnant - breast-feeding How should I use this medicine? This medicine is for injection into a muscle or infusion into a vein or skin. It is usually given by a health care professional in a hospital or clinic setting. In rare cases, some brands of this medicine might be given at home. You will be taught how to give this medicine. Use exactly as directed. Take your medicine at regular intervals. Do not take your medicine more often than directed. Talk to your pediatrician regarding the use of this medicine in children. Special care may be needed. Overdosage: If you think you have taken too much of this medicine contact a poison control center or emergency room at once. NOTE: This medicine is only for you. Do not share this medicine with others. What if I miss a dose? It is important not to miss your dose. Call your doctor or health care professional if you are unable to keep an  appointment. If you give yourself the medicine and you miss a dose, take it as soon as you can. If it is almost time for your next dose, take only that dose. Do not take double or extra doses. What may interact with this medicine? -aspirin and aspirin-like medicines -cisplatin -cyclosporine -medicines for infection like acyclovir, adefovir, amphotericin B, bacitracin, cidofovir, foscarnet, ganciclovir, gentamicin, pentamidine, vancomycin -NSAIDS, medicines for pain and inflammation, like ibuprofen or naproxen -pamidronate -vaccines -zoledronic acid This list may not describe all possible interactions. Give your health care Gunner Iodice a list of all the medicines, herbs, non-prescription drugs, or dietary supplements you use. Also tell them if you smoke, drink alcohol, or use illegal drugs. Some items may interact with your medicine. What should I watch for while using this medicine? Your condition will be monitored carefully while you are receiving this medicine. This medicine is made from pooled blood donations of many different people. It may be possible to pass an infection in this medicine. However, the donors are screened for infections and all products are tested for HIV and hepatitis. The medicine is treated to kill most or all bacteria and viruses. Talk to your doctor about the risks and benefits of this medicine. Do not have vaccinations for at least 14 days before, or until at least 3 months after receiving this medicine. What side effects may I notice from receiving this medicine? Side effects that you should report to your doctor or health care professional as soon as possible: -allergic reactions like skin rash, itching or hives, swelling of   the face, lips, or tongue -breathing problems -chest pain or tightness -fever, chills -headache with nausea, vomiting -neck pain or difficulty moving neck -pain when moving eyes -pain, swelling, warmth in the leg -problems with balance,  talking, walking -sudden weight gain -swelling of the ankles, feet, hands -trouble passing urine or change in the amount of urine Side effects that usually do not require medical attention (report to your doctor or health care professional if they continue or are bothersome): -dizzy, drowsy -flushing -increased sweating -leg cramps -muscle aches and pains -pain at site where injected This list may not describe all possible side effects. Call your doctor for medical advice about side effects. You may report side effects to FDA at 1-800-FDA-1088. Where should I keep my medicine? Keep out of the reach of children. This drug is usually given in a hospital or clinic and will not be stored at home. In rare cases, some brands of this medicine may be given at home. If you are using this medicine at home, you will be instructed on how to store this medicine. Throw away any unused medicine after the expiration date on the label. NOTE: This sheet is a summary. It may not cover all possible information. If you have questions about this medicine, talk to your doctor, pharmacist, or health care Charish Schroepfer.    2016, Elsevier/Gold Standard. (2008-04-23 11:44:49)  

## 2015-01-02 NOTE — Assessment & Plan Note (Signed)
The patient has achieved complete response clinically. I will continue to see him every 6 months with history, physical examination and blood work and to defer imaging studies unless he had signs and symptoms to suggest disease recurrence. 

## 2015-01-02 NOTE — Progress Notes (Signed)
[place] OFFICE PROGRESS NOTE  Patient Care Team: [name], PA-C as PCP - General (Family Medicine) Burtis Junes, NP as Nurse Practitioner (Cardiology)  SUMMARY OF ONCOLOGIC HISTORY:  This patient was originally diagnosed with CLL after he was found to have leukocytosis. Imaging study with CT scan show diffuse lymphadenopathy with splenomegaly. Due to worsening thrombocytopenia, he was subsequently treated with combination therapy with fludarabine, Cytoxan and rituximab from May 2014 to October 2014. The patient denies side effects or complications from treatment. He has significant smoking history with suspected COPD. 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 From October 2014 onwards, he was prescribed monthly IVIG infusion.  INTERVAL HISTORY: Please see below for problem oriented charting. The patient is sad that he became unemployed recently. He denies recent infection. No new lymphadenopathy. He had a mole on the top of his head that he wants me to take a look at. He complained of intermittent discomfort in his abdomen that comes and goes. REVIEW OF SYSTEMS:   Constitutional: Denies fevers, chills or abnormal weight loss Eyes: Denies blurriness of vision Ears, nose, mouth, throat, and face: Denies mucositis or sore throat Respiratory: Denies cough, dyspnea or wheezes Cardiovascular: Denies palpitation, chest discomfort or lower extremity swelling Gastrointestinal:  Denies nausea, heartburn or change in bowel habits Skin: Denies abnormal skin rashes Lymphatics: Denies new lymphadenopathy or easy bruising Neurological:Denies numbness, tingling or new weaknesses Behavioral/Psych: Mood is stable, no new changes  All other systems were reviewed with the patient and are negative.  I have reviewed the past medical history, past surgical history, social history and family history with the patient and they are  unchanged from previous note.  ALLERGIES:  is allergic to codeine.  MEDICATIONS:  Current Outpatient Prescriptions  Medication Sig Dispense Refill  . acetaminophen (TYLENOL) 500 MG tablet Take 500 mg by mouth every 6 (six) hours as needed for mild pain, moderate pain or headache.    Marland Kitchen acyclovir (ZOVIRAX) 400 MG tablet Take 1 tablet (400 mg total) by mouth daily. 90 tablet 4  . ALPRAZolam (XANAX) 0.25 MG tablet Take 1 po q 6 hours prn for anxiety (Patient taking differently: Take 0.25 mg by mouth 2 (two) times daily as needed for anxiety. ) 60 tablet 0  . aspirin 325 MG tablet Take 325 mg by mouth daily.      . COMBIVENT RESPIMAT 20-100 MCG/ACT AERS respimat 1 puff every 6 (six) hours as needed for wheezing or shortness of breath.   0  . cyanocobalamin (,VITAMIN B-12,) 1000 MCG/ML injection Inject 1 mL (1,000 mcg total) into the muscle every 30 (thirty) days. 3 mL 3  . esomeprazole (NEXIUM) 40 MG capsule Take 1 capsule (40 mg total) by mouth 2 (two) times daily. 180 capsule 3  . fluticasone (FLOVENT HFA) 110 MCG/ACT inhaler Inhale 1 puff into the lungs as needed. (Patient taking differently: Inhale 1 puff into the lungs as needed (shortness of breath). ) 1 Inhaler 1  . HYDROcodone-acetaminophen (NORCO/VICODIN) 5-325 MG tablet Take 1 tablet by mouth every 6 (six) hours as needed for moderate pain.   0  . lidocaine-prilocaine (EMLA) cream APPLY 1 APPLICATION TOPICALLY AS NEEDED 30 g 0  . meclizine (ANTIVERT) 12.5 MG tablet Take 12.5 mg by mouth every 6 (six) hours as needed for dizziness.   0  . metFORMIN (GLUCOPHAGE-XR) 500 MG 24 hr tablet Take 500 mg by mouth 2 (two) times daily.    . metoprolol succinate (TOPROL  XL) 25 MG 24 hr tablet Take 1 tablet (25 mg total) by mouth daily. 90 tablet 3  . NICODERM CQ 21 MG/24HR patch Place 1 patch (21 mg total) onto the skin daily. 30 patch 2  . nitroGLYCERIN (NITROSTAT) 0.4 MG SL tablet Place 1 tablet (0.4 mg total) under the tongue every 5 (five) minutes  as needed for chest pain. 90 tablet 3  . pregabalin (LYRICA) 75 MG capsule 1 cap three times daily and 2 caps at bedtime (Patient taking differently: Take 75-150 mg by mouth 4 (four) times daily. 1 cap three times daily and 2 caps at bedtime) 450 capsule 1  . rosuvastatin (CRESTOR) 5 MG tablet Take 1 tablet (5 mg total) by mouth daily. 90 tablet 3  . sildenafil (VIAGRA) 100 MG tablet Take 1 tablet (100 mg total) by mouth as needed. (Patient taking differently: Take 100 mg by mouth as needed for erectile dysfunction. ) 10 tablet 6  . tetrahydrozoline 0.05 % ophthalmic solution Place 1 drop into both eyes as needed. Red eyes    . topiramate (TOPAMAX) 25 MG tablet Take 1 tablet (25 mg total) by mouth 2 (two) times daily. 180 tablet 3  . zolpidem (AMBIEN) 10 MG tablet Take 10 mg by mouth at bedtime as needed for sleep.   0   No current facility-administered medications for this visit.   Facility-Administered Medications Ordered in Other Visits  Medication Dose Route Frequency Provider Last Rate Last Dose  . 0.9 %  sodium chloride infusion   Intravenous Continuous [name], MD 50 mL/hr at 03/07/14 1005    . acetaminophen (TYLENOL) tablet 650 mg  650 mg Oral Q6H PRN [name], MD   650 mg at 03/07/14 1012  . sodium chloride 0.9 % injection 10 mL  10 mL Intracatheter PRN Consuela Mimes, MD   10 mL at 08/22/12 1721  . sodium chloride 0.9 % injection 10 mL  10 mL Intravenous PRN Consuela Mimes, MD   10 mL at 05/29/13 1323    PHYSICAL EXAMINATION: ECOG PERFORMANCE STATUS: 0 - Asymptomatic  Filed Vitals:   01/02/15 0928  BP: 136/62  Pulse: 59  Temp: 98 F (36.7 C)  Resp: 16   Filed Weights   01/02/15 0928  Weight: 221 lb 9.6 oz (100.517 kg)    GENERAL:alert, no distress and comfortable SKIN: skin color, texture, turgor are normal, no rashes or significant lesions. Noted skin bruising. He had actinic keratosis of the top of his head which looks benign EYES: normal, Conjunctiva are pink and  non-injected, sclera clear Musculoskeletal:no cyanosis of digits and no clubbing  NEURO: alert & oriented x 3 with fluent speech, no focal motor/sensory deficits  LABORATORY DATA:  I have reviewed the data as listed    Component Value Date/Time   NA 139 01/02/2015 0919   NA 141 01/01/2015 0900   K 4.5 01/02/2015 0919   K 4.3 01/01/2015 0900   CL 101 01/01/2015 0900   CL 104 08/03/2012 1229   CO2 25 01/02/2015 0919   CO2 26 09/22/2014 0749   GLUCOSE 269* 01/02/2015 0919   GLUCOSE 215* 01/01/2015 0900   GLUCOSE 223* 08/03/2012 1229   BUN 8.1 01/02/2015 0919   BUN 11 01/01/2015 0900   CREATININE 1.0 01/02/2015 0919   CREATININE 0.80 01/01/2015 0900   CALCIUM 9.5 01/02/2015 0919   CALCIUM 9.1 09/22/2014 0749   PROT 6.5 01/02/2015 0919   PROT 6.7 08/15/2014 0929   ALBUMIN 3.9 01/02/2015 0919  ALBUMIN 3.9 08/15/2014 0929   AST 14 01/02/2015 0919   AST 19 08/15/2014 0929   ALT 17 01/02/2015 0919   ALT 24 08/15/2014 0929   ALKPHOS 98 01/02/2015 0919   ALKPHOS 76 08/15/2014 0929   BILITOT 0.67 01/02/2015 0919   BILITOT 0.7 08/15/2014 0929   GFRNONAA >90 02/03/2014 1355   GFRAA >90 02/03/2014 1355    No results found for: SPEP, UPEP  Lab Results  Component Value Date   WBC 6.6 01/02/2015   NEUTROABS 5.9 01/02/2015   HGB 16.8 01/02/2015   HCT 49.2 01/02/2015   MCV 96.1 01/02/2015   PLT 95* 01/02/2015      Chemistry      Component Value Date/Time   NA 139 01/02/2015 0919   NA 141 01/01/2015 0900   K 4.5 01/02/2015 0919   K 4.3 01/01/2015 0900   CL 101 01/01/2015 0900   CL 104 08/03/2012 1229   CO2 25 01/02/2015 0919   CO2 26 09/22/2014 0749   BUN 8.1 01/02/2015 0919   BUN 11 01/01/2015 0900   CREATININE 1.0 01/02/2015 0919   CREATININE 0.80 01/01/2015 0900      Component Value Date/Time   CALCIUM 9.5 01/02/2015 0919   CALCIUM 9.1 09/22/2014 0749   ALKPHOS 98 01/02/2015 0919   ALKPHOS 76 08/15/2014 0929   AST 14 01/02/2015 0919   AST 19 08/15/2014 0929    ALT 17 01/02/2015 0919   ALT 24 08/15/2014 0929   BILITOT 0.67 01/02/2015 0919   BILITOT 0.7 08/15/2014 0929     I reviewed his last CT imaging and suspected intermittent discomfort over the right upper quadrant could be due to underlying bowels.  ASSESSMENT & PLAN:  CLL (chronic lymphocytic leukemia) The patient has achieved complete response clinically. I will continue to see him every 6 months with history, physical examination and blood work and to defer imaging studies unless he had signs and symptoms to suggest disease recurrence.      Tobacco abuse He is motivated to quit smoking and is down to half a pack of cigarettes per day.  I continue to encourage him with his smoking cessation effort.  Hypogammaglobulinemia, acquired He has significant signs and symptoms of upper respiratory tract congestion since continuation of IVIG. The patient requested to resume therapy which I think is reasonable, to prevent risk of infection. The risk and benefits and side effects of IVIG infusion has been discussed with patient and he agreed to proceed. He has no side effects of IVIG or signs of serum sickness. Plan to continue monthly IVIG indefinitely We discussed the importance of preventive care and reviewed the vaccination programs. He does not have any prior allergic reactions to pneumococcal vaccination. He agrees to proceed with Prevnar 13 vaccination today and we will administer it today at the clinic.   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 He is receiving IVIG. He is not symptomatic. There is no contraindication for him to remain on aspirin as well as the patient had no clinical bleeding.   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 I will defer to neurologist for management.   No orders of the defined types were placed in this encounter.   All questions were answered. The patient knows to call the clinic with any problems, questions or  concerns. No barriers to learning was detected. I spent 20 minutes counseling the patient face to face. The total time spent in the appointment was 25 minutes and more than 50%  was on counseling and review of test results     [facility], [place], MD 01/02/2015 9:56 AM

## 2015-01-02 NOTE — Progress Notes (Signed)
Pt received Prevnar 13 on his L deltoid today. Pt given education regarding vaccine and what to expect after injection. Pt understands and aware of possible s/s of vaccine.

## 2015-01-02 NOTE — Telephone Encounter (Signed)
per pof to sch pt apppt-gave -sent Mw email to sch pt trmt-pt will see [place] for appts

## 2015-01-05 ENCOUNTER — Telehealth: Payer: Self-pay | Admitting: Vascular Surgery

## 2015-01-05 NOTE — Telephone Encounter (Signed)
-----   Message from Denman George, RN sent at 01/01/2015  2:40 PM EST ----- Regarding: Ryan Copeland log; also needs 4 wk. f/u with Dr. Bridgett Larsson   ----- Message -----    From: Conrad [place], MD    Sent: 01/01/2015  11:03 AM      To: [facility] [address]  KENYEN CANDY [id number] [birth year]  PROCEDURE: 1.  Left common femoral artery cannulation under ultrasound guidance 2.  Placement of catheter in aorta 3.  Aortogram 4.  Bilateral leg runoff  Follow-up: 4 weeks

## 2015-01-05 NOTE — Telephone Encounter (Signed)
Spoke with pt, dpm °

## 2015-01-06 ENCOUNTER — Ambulatory Visit (INDEPENDENT_AMBULATORY_CARE_PROVIDER_SITE_OTHER): Payer: BLUE CROSS/BLUE SHIELD | Admitting: Nurse Practitioner

## 2015-01-06 ENCOUNTER — Other Ambulatory Visit: Payer: Self-pay | Admitting: Hematology and Oncology

## 2015-01-06 ENCOUNTER — Encounter: Payer: Self-pay | Admitting: Nurse Practitioner

## 2015-01-06 VITALS — BP 129/70 | HR 57 | Ht 72.0 in | Wt 219.2 lb

## 2015-01-06 DIAGNOSIS — G622 Polyneuropathy due to other toxic agents: Secondary | ICD-10-CM

## 2015-01-06 DIAGNOSIS — G619 Inflammatory polyneuropathy, unspecified: Secondary | ICD-10-CM

## 2015-01-06 DIAGNOSIS — G609 Hereditary and idiopathic neuropathy, unspecified: Secondary | ICD-10-CM

## 2015-01-06 MED ORDER — TOPIRAMATE 25 MG PO TABS: ORAL_TABLET | ORAL | Status: DC

## 2015-01-06 NOTE — Progress Notes (Signed)
[facility]  PATIENT: Ryan Copeland DOB: [birth year]   REASON FOR VISIT: Follow-up for inflammatory neuropathy, idiopathic peripheral neuropathy increased pain HISTORY FROM: Patient    HISTORY OF PRESENT ILLNESS:[name], 64 year old male returns for followup. He has a history of polyneuropathy which has been long-standing. He was last seen 06/30/2014. He continues to have some pain, numbness and paresthesias in both feet left worse than right. Sensation is worse in great toes. His feet are cold he also has a burning sensation. His symptoms have  worsened since last seen Gabapentin was not beneficial for his symptoms. He was found to have a low B6 level and an extremely low vitamin B12 level at 121. He has been on vitamin B12 injections which his wife gives monthly. He is sleeping well. He is currently taking Lyrica 75 mg 3 times a day and two 75 mg at night for a total dose of '375mg'$ . He was placed on Transdermal cream last year, with good results. He has failed Cymbalta. He is also on Topamax. He states his walking and balance are unchanged. He has not fallen. He stopped working 2 weeks ago . 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 He returns for reevaluation and adjustment to his medication regimen.    REVIEW OF SYSTEMS: Full 14 system review of systems performed and notable only for those listed, all others are neg:  Constitutional: neg  Cardiovascular: neg Ear/Nose/Throat: neg  Skin: neg Eyes: neg Respiratory: neg Gastroitestinal: neg  Hematology/Lymphatic: neg  Endocrine: neg Musculoskeletal:neg Allergy/Immunology: neg Neurological: Increased pain in feet Psychiatric: neg Sleep : neg   ALLERGIES: Allergies  Allergen Reactions  . Codeine Hives    Pt states he can take a few, more reaction with extended doses.    HOME MEDICATIONS: Outpatient Prescriptions Prior to Visit  Medication Sig Dispense Refill  . acetaminophen (TYLENOL) 500 MG tablet Take  500 mg by mouth every 6 (six) hours as needed for mild pain, moderate pain or headache.    Marland Kitchen acyclovir (ZOVIRAX) 400 MG tablet Take 1 tablet (400 mg total) by mouth daily. 90 tablet 4  . ALPRAZolam (XANAX) 0.25 MG tablet Take 1 po q 6 hours prn for anxiety (Patient taking differently: Take 0.25 mg by mouth 2 (two) times daily as needed for anxiety. ) 60 tablet 0  . aspirin 325 MG tablet Take 325 mg by mouth daily.      . COMBIVENT RESPIMAT 20-100 MCG/ACT AERS respimat 1 puff every 6 (six) hours as needed for wheezing or shortness of breath.   0  . cyanocobalamin (,VITAMIN B-12,) 1000 MCG/ML injection Inject 1 mL (1,000 mcg total) into the muscle every 30 (thirty) days. 3 mL 3  . esomeprazole (NEXIUM) 40 MG capsule Take 1 capsule (40 mg total) by mouth 2 (two) times daily. 180 capsule 3  . fluticasone (FLOVENT HFA) 110 MCG/ACT inhaler Inhale 1 puff into the lungs as needed. (Patient taking differently: Inhale 1 puff into the lungs as needed (shortness of breath). ) 1 Inhaler 1  . HYDROcodone-acetaminophen (NORCO/VICODIN) 5-325 MG tablet Take 1 tablet by mouth every 6 (six) hours as needed for moderate pain.   0  . lidocaine-prilocaine (EMLA) cream APPLY 1 APPLICATION TOPICALLY AS NEEDED 30 g 0  . meclizine (ANTIVERT) 12.5 MG tablet Take 12.5 mg by mouth every 6 (six) hours as needed for dizziness.   0  . metFORMIN (GLUCOPHAGE-XR) 500 MG 24 hr tablet Take 500 mg by mouth 2 (two) times daily.    Marland Kitchen  metoprolol succinate (TOPROL XL) 25 MG 24 hr tablet Take 1 tablet (25 mg total) by mouth daily. 90 tablet 3  . NICODERM CQ 21 MG/24HR patch Place 1 patch (21 mg total) onto the skin daily. 30 patch 2  . nitroGLYCERIN (NITROSTAT) 0.4 MG SL tablet Place 1 tablet (0.4 mg total) under the tongue every 5 (five) minutes as needed for chest pain. 90 tablet 3  . pregabalin (LYRICA) 75 MG capsule 1 cap three times daily and 2 caps at bedtime (Patient taking differently: Take 75-150 mg by mouth 4 (four) times daily. 1  cap three times daily and 2 caps at bedtime) 450 capsule 1  . rosuvastatin (CRESTOR) 5 MG tablet Take 1 tablet (5 mg total) by mouth daily. 90 tablet 3  . sildenafil (VIAGRA) 100 MG tablet Take 1 tablet (100 mg total) by mouth as needed. (Patient taking differently: Take 100 mg by mouth as needed for erectile dysfunction. ) 10 tablet 6  . tetrahydrozoline 0.05 % ophthalmic solution Place 1 drop into both eyes as needed. Red eyes    . topiramate (TOPAMAX) 25 MG tablet Take 1 tablet (25 mg total) by mouth 2 (two) times daily. 180 tablet 3  . zolpidem (AMBIEN) 10 MG tablet Take 10 mg by mouth at bedtime as needed for sleep.   0   Facility-Administered Medications Prior to Visit  Medication Dose Route Frequency Provider Last Rate Last Dose  . 0.9 %  sodium chloride infusion   Intravenous Continuous [name], MD 50 mL/hr at 03/07/14 1005    . acetaminophen (TYLENOL) tablet 650 mg  650 mg Oral Q6H PRN [name], MD   650 mg at 03/07/14 1012  . sodium chloride 0.9 % injection 10 mL  10 mL Intracatheter PRN Consuela Mimes, MD   10 mL at 08/22/12 1721  . sodium chloride 0.9 % injection 10 mL  10 mL Intravenous PRN Consuela Mimes, MD   10 mL at 05/29/13 1323    PAST MEDICAL HISTORY: Past Medical History  Diagnosis Date  . MI, acute, non ST segment elevation ([place]) 06/28/2009    with stenting of the LAD  . Hyperlipidemia   . Tobacco abuse   . PVD (peripheral vascular disease) ([place])   . CAD (coronary artery disease)   . Anxiety 01/30/2014  . Diabetes mellitus ([place])     Type 2   . GERD (gastroesophageal reflux disease)     takes Nexium if needed  . Neuromuscular disorder ([place])     peripheral neuropathy  . CLL (chronic lymphocytic leukemia) ([place]) 03/18/2011  . ECRB (extensor carpi radialis brevis) tenosynovitis   . Lateral epicondylitis of left elbow     PAST SURGICAL HISTORY: Past Surgical History  Procedure Laterality Date  . Femoral stents    . Coronary stent placement  May 2011  .  Cholecystectomy  2007  . Carpel tunnel release Left [id number]  . Carpel tunnel release  Right 01-1989  . Tarsal tunnel release Bilateral 08-2007  . Left cai stent/pta and popliteal artery/tibial thrombectomy     . Left heart catheterization with coronary angiogram N/A 08/26/2011    Procedure: LEFT HEART CATHETERIZATION WITH CORONARY ANGIOGRAM;  Surgeon: Peter M Martinique, MD;  Location[facility] CATH LAB;  Service: Cardiovascular;  Laterality: N/A;  . Cardiac catheterization    . Adenoidectomy  1955  . Lateral epicondyle release Left 02/12/2014    Procedure: LEFT ELBOW DEBRIDEMENT WITH TENDON REPAIR ;  Surgeon: Lorn Junes, MD;  Location: [facility]  OR;  Service: Orthopedics;  Laterality: Left;  . Cardiac catheterization N/A 09/26/2014    Procedure: Left Heart Cath and Coronary Angiography;  Surgeon: Peter M Martinique, MD;  Location: [place] CV LAB;  Service: Cardiovascular;  Laterality: N/A;  . Peripheral vascular catheterization N/A 01/01/2015    Procedure: Abdominal Aortogram;  Surgeon: Conrad Gerty, MD;  Location: [place] CV LAB;  Service: Cardiovascular;  Laterality: N/A;    FAMILY HISTORY: Family History  Problem Relation Age of Onset  . Lung cancer Mother 79  . Cancer Mother     lung  . Heart failure Father 47  . Heart disease Father     SOCIAL HISTORY: Social History   Social History  . Marital Status: Married    Spouse Name: [name]  . Number of Children: 1  . Years of Education: College   Occupational History  .      [employer]   Social History Main Topics  . Smoking status: Current Every Day Smoker -- 0.50 packs/day for 44 years    Types: Cigarettes  . Smokeless tobacco: Former Systems developer  . Alcohol Use: No     Comment:  drinks non-alcoholic beer  . Drug Use: No  . Sexual Activity: Yes   Other Topics Concern  . Not on file   Social History Narrative   Patient lives at home with wife.   Caffeine Use: 15 cups of caffeine weekly     PHYSICAL EXAM  Filed Vitals:     01/06/15 1023  BP: 129/70  Pulse: 57  Height: 6' (1.829 m)  Weight: 219 lb 3.2 oz (99.428 kg)   Body mass index is 29.72 kg/(m^2). Generalized: Well developed, in no acute distress  Head: normocephalic and atraumatic,. Oropharynx benign  Neck: Supple, no carotid bruits  Cardiac: Regular rate rhythm, no murmur  Musculoskeletal: No deformity   Neurological examination   Mentation: Alert oriented to time, place, history taking. Follows all commands speech and language fluent  Cranial nerve II-XII: Pupils were equal round reactive to light extraocular movements were full, visual field were full on confrontational test. Facial sensation and strength were normal. hearing was intact to finger rubbing bilaterally. Uvula tongue midline. head turning and shoulder shrug were normal and symmetric.Tongue protrusion into cheek strength was normal. Motor: normal bulk and tone, full strength in the BUE, BLE, fine finger movements normal, no pronator drift. No focal weakness Sensory: Diminished pinprick to the shins bilaterally, decreased vibratory to the toes, position sense is normal  Coordination: finger-nose-finger, heel-to-shin bilaterally, no dysmetria Reflexes: Diminished upper and lower and symmetric, plantar responses were flexor bilaterally. Gait and Station: Rising up from seated position without assistance, normal stance, moderate stride, good arm swing, smooth turning, able to perform tiptoe, and heel walking without difficulty. Tandem gait is mildly unsteady. No assistive device  DIAGNOSTIC DATA (LABS, IMAGING, TESTING) - I reviewed patient records, labs, notes, testing and imaging myself where available.  Lab Results  Component Value Date   WBC 6.6 01/02/2015   HGB 16.8 01/02/2015   HCT 49.2 01/02/2015   MCV 96.1 01/02/2015   PLT 95* 01/02/2015      Component Value Date/Time   NA 139 01/02/2015 0919   NA 141 01/01/2015 0900   K 4.5 01/02/2015 0919   K 4.3 01/01/2015  0900   CL 101 01/01/2015 0900   CL 104 08/03/2012 1229   CO2 25 01/02/2015 0919   CO2 26 09/22/2014 0749   GLUCOSE 269* 01/02/2015 0919  GLUCOSE 215* 01/01/2015 0900   GLUCOSE 223* 08/03/2012 1229   BUN 8.1 01/02/2015 0919   BUN 11 01/01/2015 0900   CREATININE 1.0 01/02/2015 0919   CREATININE 0.80 01/01/2015 0900   CALCIUM 9.5 01/02/2015 0919   CALCIUM 9.1 09/22/2014 0749   PROT 6.5 01/02/2015 0919   PROT 6.7 08/15/2014 0929   ALBUMIN 3.9 01/02/2015 0919   ALBUMIN 3.9 08/15/2014 0929   AST 14 01/02/2015 0919   AST 19 08/15/2014 0929   ALT 17 01/02/2015 0919   ALT 24 08/15/2014 0929   ALKPHOS 98 01/02/2015 0919   ALKPHOS 76 08/15/2014 0929   BILITOT 0.67 01/02/2015 0919   BILITOT 0.7 08/15/2014 0929   GFRNONAA >90 02/03/2014 1355   GFRAA >90 02/03/2014 1355   Lab Results  Component Value Date   CHOL 134 08/15/2014   HDL 29.10* 08/15/2014   LDLCALC 75 08/15/2014   LDLDIRECT 117.6 08/22/2011   TRIG 151.0* 08/15/2014   CHOLHDL 5 08/15/2014    No results found for: [id number] Lab Results  Component Value Date   TSH 2.00 08/15/2014      ASSESSMENT AND PLAN  64 y.o. year old male  has a past medical history of  Tobacco abuse; PVD (peripheral vascular disease) ([place]);  Diabetes mellitus ([place]);  Neuromuscular disorder ([place]); CLL (chronic lymphocytic leukemia) ([place]) (03/18/2011); and peripheral neuropathy here to follow-up. Reviewed labs from 01/02/2015  Continue Lyrica at current dose Increase Topamax to 2 tablets twice daily after 2 weeks may further increase to 3 tablets twice daily if necessary will refill 3 month supply Follow-up is already planned Dennie Bible, [facility], [facility], APRN  [facility] [address] [place], [place] [address] [phone]

## 2015-01-06 NOTE — Patient Instructions (Signed)
Continue Lyrica at current dose Increase Topamax to 2 tablets twice daily after 2 weeks may further increase to 3 tablets twice daily if necessary Follow-up is planned

## 2015-01-07 NOTE — Progress Notes (Signed)
I have reviewed and agreed above plan. 

## 2015-01-14 ENCOUNTER — Encounter: Payer: Self-pay | Admitting: Nurse Practitioner

## 2015-01-14 ENCOUNTER — Other Ambulatory Visit: Payer: Self-pay

## 2015-01-14 ENCOUNTER — Other Ambulatory Visit: Payer: Self-pay | Admitting: Nurse Practitioner

## 2015-01-14 DIAGNOSIS — E785 Hyperlipidemia, unspecified: Secondary | ICD-10-CM

## 2015-01-14 DIAGNOSIS — I2510 Atherosclerotic heart disease of native coronary artery without angina pectoris: Secondary | ICD-10-CM

## 2015-01-14 MED ORDER — PREGABALIN 75 MG PO CAPS: ORAL_CAPSULE | ORAL | Status: DC

## 2015-01-14 MED ORDER — CYANOCOBALAMIN 1000 MCG/ML IJ SOLN: 1000.0000 ug | INTRAMUSCULAR | Status: DC

## 2015-01-14 MED ORDER — ROSUVASTATIN CALCIUM 5 MG PO TABS: 5.0000 mg | ORAL_TABLET | Freq: Every day | ORAL | Status: DC

## 2015-01-14 NOTE — Telephone Encounter (Signed)
Rx signed and faxed.

## 2015-01-14 NOTE — Telephone Encounter (Signed)
Patient requesting 90 day Rx's.

## 2015-01-16 ENCOUNTER — Encounter: Payer: Self-pay | Admitting: Vascular Surgery

## 2015-01-23 ENCOUNTER — Encounter: Payer: Self-pay | Admitting: Vascular Surgery

## 2015-01-23 ENCOUNTER — Ambulatory Visit (INDEPENDENT_AMBULATORY_CARE_PROVIDER_SITE_OTHER): Payer: BLUE CROSS/BLUE SHIELD | Admitting: Vascular Surgery

## 2015-01-23 VITALS — BP 132/57 | HR 63 | Temp 97.9°F | Resp 16 | Ht 72.0 in | Wt 213.0 lb

## 2015-01-23 DIAGNOSIS — I70212 Atherosclerosis of native arteries of extremities with intermittent claudication, left leg: Secondary | ICD-10-CM

## 2015-01-23 DIAGNOSIS — I70219 Atherosclerosis of native arteries of extremities with intermittent claudication, unspecified extremity: Secondary | ICD-10-CM | POA: Insufficient documentation

## 2015-01-23 NOTE — Progress Notes (Signed)
Postoperative Visit   History of Present Illness  Ryan Copeland is a 64 y.o. male who presents for postoperative follow-up from procedure on Date: 01/01/15: Ao, [name]  The patient's hip pain has improved though he notes his ambulation is limited.  He denies any rest pain or wounds.  Past Medical History, Past Surgical History, Social History, Family History, Medications, Allergies, and Review of Systems are unchanged from previous evaluation on 01/01/15.  Current Outpatient Prescriptions  Medication Sig Dispense Refill  . acetaminophen (TYLENOL) 500 MG tablet Take 500 mg by mouth every 6 (six) hours as needed for mild pain, moderate pain or headache.    . ALPRAZolam (XANAX) 0.25 MG tablet Take 1 po q 6 hours prn for anxiety (Patient taking differently: Take 0.25 mg by mouth 2 (two) times daily as needed for anxiety. ) 60 tablet 0  . aspirin 325 MG tablet Take 325 mg by mouth daily.      . COMBIVENT RESPIMAT 20-100 MCG/ACT AERS respimat 1 puff every 6 (six) hours as needed for wheezing or shortness of breath.   0  . cyanocobalamin (,VITAMIN B-12,) 1000 MCG/ML injection Inject 1 mL (1,000 mcg total) into the muscle every 30 (thirty) days. 3 mL 1  . esomeprazole (NEXIUM) 40 MG capsule Take 1 capsule (40 mg total) by mouth 2 (two) times daily. 180 capsule 3  . fluticasone (FLOVENT HFA) 110 MCG/ACT inhaler Inhale 1 puff into the lungs as needed. (Patient taking differently: Inhale 1 puff into the lungs as needed (shortness of breath). ) 1 Inhaler 1  . HYDROcodone-acetaminophen (NORCO/VICODIN) 5-325 MG tablet Take 1 tablet by mouth every 6 (six) hours as needed for moderate pain.   0  . lidocaine-prilocaine (EMLA) cream apply to affected area topically if needed 30 g 3  . meclizine (ANTIVERT) 12.5 MG tablet Take 12.5 mg by mouth every 6 (six) hours as needed for dizziness.   0  . metFORMIN (GLUCOPHAGE-XR) 500 MG 24 hr tablet Take 500 mg by mouth 2 (two) times daily.    . metoprolol succinate  (TOPROL XL) 25 MG 24 hr tablet Take 1 tablet (25 mg total) by mouth daily. 90 tablet 3  . NICODERM CQ 21 MG/24HR patch Place 1 patch (21 mg total) onto the skin daily. 30 patch 2  . nitroGLYCERIN (NITROSTAT) 0.4 MG SL tablet Place 1 tablet (0.4 mg total) under the tongue every 5 (five) minutes as needed for chest pain. 90 tablet 3  . pregabalin (LYRICA) 75 MG capsule 1 cap three times daily and 2 caps at bedtime 450 capsule 1  . rosuvastatin (CRESTOR) 5 MG tablet Take 1 tablet (5 mg total) by mouth daily. 90 tablet 3  . tetrahydrozoline 0.05 % ophthalmic solution Place 1 drop into both eyes as needed. Red eyes    . topiramate (TOPAMAX) 25 MG tablet '50mg'$  twice daily for 2 weeks then increase to 3 tabs twice daily if necessary 540 tablet 2  . zolpidem (AMBIEN) 10 MG tablet Take 10 mg by mouth at bedtime as needed for sleep.   0  . acyclovir (ZOVIRAX) 400 MG tablet Take 1 tablet (400 mg total) by mouth daily. 90 tablet 4  . BAYER CONTOUR NEXT TEST test strip     . BAYER MICROLET LANCETS lancets     . sildenafil (VIAGRA) 100 MG tablet Take 1 tablet (100 mg total) by mouth as needed. (Patient taking differently: Take 100 mg by mouth as needed for erectile dysfunction. ) 10  tablet 6   No current facility-administered medications for this visit.   Facility-Administered Medications Ordered in Other Visits  Medication Dose Route Frequency Provider Last Rate Last Dose  . 0.9 %  sodium chloride infusion   Intravenous Continuous [name], MD 50 mL/hr at 03/07/14 1005    . acetaminophen (TYLENOL) tablet 650 mg  650 mg Oral Q6H PRN [name], MD   650 mg at 03/07/14 1012  . sodium chloride 0.9 % injection 10 mL  10 mL Intracatheter PRN Consuela Mimes, MD   10 mL at 08/22/12 1721  . sodium chloride 0.9 % injection 10 mL  10 mL Intravenous PRN Consuela Mimes, MD   10 mL at 05/29/13 1323      For VQI Use Only  PRE-ADM LIVING: Home  AMB STATUS: Ambulatory  Physical Examination  Filed Vitals:    01/23/15 1425  BP: 132/57  Pulse: 63  Temp: 97.9 F (36.6 C)  Resp: 16  Height: 6' (1.829 m)  Weight: 213 lb (96.616 kg)  SpO2: 100%   Body mass index is 28.88 kg/(m^2).  Vascular: Vessel Right Left  Femoral Palpable Palpable  Popliteal Not palpable Not palpable  PT Palpable Palpable  DP Palpable Palpable    Musculoskeletal: M/S 5/5 throughout , Extremities without ischemic changes , L groin without hematoma, no echymosis present at cannulation site   Medical Decision Making  [name] is a 64 y.o. male who presents [name], [name]  Angiogram demonstrated no findings which would account for his hip pain. Based on his angiographic findings, this patient needs: annual ABI and Aortoiliac duplex. I discussed in depth with the patient the nature of atherosclerosis, and emphasized the importance of maximal medical management including strict control of blood pressure, blood glucose, and lipid levels, obtaining regular exercise, and cessation of smoking.  The patient is aware that without maximal medical management the underlying atherosclerotic disease process will progress, limiting the benefit of any interventions. The patient is currently on a statin: Crestor. The patient is currently on an anti-platelet: ASA.  Thank you for allowing [place] to participate in this patient's care.  Adele Barthel, MD [facility] Office: [phone] Pager: [phone]  01/23/2015, 3:08 PM

## 2015-01-26 NOTE — Addendum Note (Signed)
Addended by: Dorthula Rue L on: 01/26/2015 02:11 PM   Modules accepted: Orders

## 2015-01-30 ENCOUNTER — Ambulatory Visit (HOSPITAL_BASED_OUTPATIENT_CLINIC_OR_DEPARTMENT_OTHER): Payer: BLUE CROSS/BLUE SHIELD

## 2015-01-30 ENCOUNTER — Other Ambulatory Visit (HOSPITAL_BASED_OUTPATIENT_CLINIC_OR_DEPARTMENT_OTHER): Payer: BLUE CROSS/BLUE SHIELD

## 2015-01-30 VITALS — BP 112/58 | HR 98 | Temp 98.0°F | Resp 18

## 2015-01-30 DIAGNOSIS — C9110 Chronic lymphocytic leukemia of B-cell type not having achieved remission: Secondary | ICD-10-CM

## 2015-01-30 DIAGNOSIS — D801 Nonfamilial hypogammaglobulinemia: Secondary | ICD-10-CM | POA: Diagnosis not present

## 2015-01-30 LAB — CBC WITH DIFFERENTIAL/PLATELET
BASO%: 0.3 % (ref 0.0–2.0)
Basophils Absolute: 0 10*3/uL (ref 0.0–0.1)
EOS ABS: 0 10*3/uL (ref 0.0–0.5)
EOS%: 0.5 % (ref 0.0–7.0)
HCT: 48.1 % (ref 38.4–49.9)
HGB: 16.2 g/dL (ref 13.0–17.1)
LYMPH%: 6.6 % — AB (ref 14.0–49.0)
MCH: 32 pg (ref 27.2–33.4)
MCHC: 33.6 g/dL (ref 32.0–36.0)
MCV: 95.3 fL (ref 79.3–98.0)
MONO#: 0.5 10*3/uL (ref 0.1–0.9)
MONO%: 8.8 % (ref 0.0–14.0)
NEUT#: 5.1 10*3/uL (ref 1.5–6.5)
NEUT%: 83.8 % — AB (ref 39.0–75.0)
PLATELETS: 91 10*3/uL — AB (ref 140–400)
RBC: 5.05 10*6/uL (ref 4.20–5.82)
RDW: 14.6 % (ref 11.0–14.6)
WBC: 6.1 10*3/uL (ref 4.0–10.3)
lymph#: 0.4 10*3/uL — ABNORMAL LOW (ref 0.9–3.3)

## 2015-01-30 MED ORDER — SODIUM CHLORIDE 0.9 % IV SOLN
Freq: Once | INTRAVENOUS | Status: AC
  Administered 2015-01-30: 10:00:00 via INTRAVENOUS

## 2015-01-30 MED ORDER — DIPHENHYDRAMINE HCL 25 MG PO TABS
25.0000 mg | ORAL_TABLET | Freq: Once | ORAL | Status: AC
  Administered 2015-01-30: 25 mg via ORAL
  Filled 2015-01-30: qty 1

## 2015-01-30 MED ORDER — IMMUNE GLOBULIN (HUMAN) 10 GM/100ML IV SOLN
0.5000 g/kg | Freq: Once | INTRAVENOUS | Status: AC
  Administered 2015-01-30: 50 g via INTRAVENOUS
  Filled 2015-01-30: qty 400

## 2015-01-30 MED ORDER — ACETAMINOPHEN 325 MG PO TABS
ORAL_TABLET | ORAL | Status: AC
  Filled 2015-01-30: qty 2

## 2015-01-30 MED ORDER — DIPHENHYDRAMINE HCL 25 MG PO CAPS
ORAL_CAPSULE | ORAL | Status: AC
  Filled 2015-01-30: qty 1

## 2015-01-30 MED ORDER — ACETAMINOPHEN 325 MG PO TABS
650.0000 mg | ORAL_TABLET | Freq: Four times a day (QID) | ORAL | Status: DC | PRN
  Administered 2015-01-30: 650 mg via ORAL

## 2015-01-30 NOTE — Patient Instructions (Signed)

## 2015-02-10 ENCOUNTER — Other Ambulatory Visit: Payer: Self-pay | Admitting: Occupational Medicine

## 2015-02-10 ENCOUNTER — Ambulatory Visit: Payer: Self-pay

## 2015-02-10 DIAGNOSIS — M5442 Lumbago with sciatica, left side: Secondary | ICD-10-CM

## 2015-02-12 ENCOUNTER — Encounter: Payer: Self-pay | Admitting: Nurse Practitioner

## 2015-02-12 ENCOUNTER — Other Ambulatory Visit: Payer: Self-pay | Admitting: *Deleted

## 2015-02-12 MED ORDER — SILDENAFIL CITRATE 100 MG PO TABS: 100.0000 mg | ORAL_TABLET | ORAL | Status: DC | PRN

## 2015-02-27 ENCOUNTER — Other Ambulatory Visit (HOSPITAL_BASED_OUTPATIENT_CLINIC_OR_DEPARTMENT_OTHER): Payer: BLUE CROSS/BLUE SHIELD

## 2015-02-27 ENCOUNTER — Ambulatory Visit (HOSPITAL_BASED_OUTPATIENT_CLINIC_OR_DEPARTMENT_OTHER): Payer: BLUE CROSS/BLUE SHIELD

## 2015-02-27 ENCOUNTER — Other Ambulatory Visit: Payer: Self-pay | Admitting: Hematology and Oncology

## 2015-02-27 VITALS — BP 125/57 | HR 55 | Temp 97.8°F | Resp 18

## 2015-02-27 DIAGNOSIS — C9110 Chronic lymphocytic leukemia of B-cell type not having achieved remission: Secondary | ICD-10-CM

## 2015-02-27 DIAGNOSIS — D801 Nonfamilial hypogammaglobulinemia: Secondary | ICD-10-CM

## 2015-02-27 LAB — CBC WITH DIFFERENTIAL/PLATELET
BASO%: 0.5 % (ref 0.0–2.0)
BASOS ABS: 0 10*3/uL (ref 0.0–0.1)
EOS ABS: 0.1 10*3/uL (ref 0.0–0.5)
EOS%: 1 % (ref 0.0–7.0)
HCT: 48 % (ref 38.4–49.9)
HEMOGLOBIN: 16.5 g/dL (ref 13.0–17.1)
LYMPH#: 0.5 10*3/uL — AB (ref 0.9–3.3)
LYMPH%: 9 % — ABNORMAL LOW (ref 14.0–49.0)
MCH: 32.6 pg (ref 27.2–33.4)
MCHC: 34.3 g/dL (ref 32.0–36.0)
MCV: 95 fL (ref 79.3–98.0)
MONO#: 0.5 10*3/uL (ref 0.1–0.9)
MONO%: 8.5 % (ref 0.0–14.0)
NEUT#: 4.7 10*3/uL (ref 1.5–6.5)
NEUT%: 81 % — AB (ref 39.0–75.0)
PLATELETS: 108 10*3/uL — AB (ref 140–400)
RBC: 5.05 10*6/uL (ref 4.20–5.82)
RDW: 14.7 % — AB (ref 11.0–14.6)
WBC: 5.8 10*3/uL (ref 4.0–10.3)

## 2015-02-27 MED ORDER — HEPARIN SOD (PORK) LOCK FLUSH 100 UNIT/ML IV SOLN
500.0000 [IU] | Freq: Once | INTRAVENOUS | Status: AC
  Administered 2015-02-27: 500 [IU] via INTRAVENOUS
  Filled 2015-02-27: qty 5

## 2015-02-27 MED ORDER — DIPHENHYDRAMINE HCL 25 MG PO CAPS
ORAL_CAPSULE | ORAL | Status: AC
  Filled 2015-02-27: qty 1

## 2015-02-27 MED ORDER — SODIUM CHLORIDE 0.9 % IJ SOLN
10.0000 mL | Freq: Once | INTRAMUSCULAR | Status: AC
  Administered 2015-02-27: 10 mL via INTRAVENOUS
  Filled 2015-02-27: qty 10

## 2015-02-27 MED ORDER — IMMUNE GLOBULIN (HUMAN) 10 GM/100ML IV SOLN
50.0000 g | Freq: Once | INTRAVENOUS | Status: AC
  Administered 2015-02-27: 50 g via INTRAVENOUS
  Filled 2015-02-27: qty 500

## 2015-02-27 MED ORDER — ACETAMINOPHEN 325 MG PO TABS
ORAL_TABLET | ORAL | Status: AC
  Filled 2015-02-27: qty 2

## 2015-02-27 MED ORDER — DIPHENHYDRAMINE HCL 25 MG PO TABS
25.0000 mg | ORAL_TABLET | Freq: Once | ORAL | Status: AC
  Administered 2015-02-27: 25 mg via ORAL
  Filled 2015-02-27: qty 1

## 2015-02-27 MED ORDER — ACETAMINOPHEN 325 MG PO TABS
650.0000 mg | ORAL_TABLET | Freq: Four times a day (QID) | ORAL | Status: DC | PRN
  Administered 2015-02-27: 650 mg via ORAL

## 2015-02-27 NOTE — Patient Instructions (Signed)

## 2015-03-27 ENCOUNTER — Other Ambulatory Visit (HOSPITAL_BASED_OUTPATIENT_CLINIC_OR_DEPARTMENT_OTHER): Payer: BLUE CROSS/BLUE SHIELD

## 2015-03-27 ENCOUNTER — Ambulatory Visit (HOSPITAL_BASED_OUTPATIENT_CLINIC_OR_DEPARTMENT_OTHER): Payer: BLUE CROSS/BLUE SHIELD

## 2015-03-27 VITALS — BP 110/50 | HR 53 | Temp 97.0°F | Resp 18

## 2015-03-27 DIAGNOSIS — C9110 Chronic lymphocytic leukemia of B-cell type not having achieved remission: Secondary | ICD-10-CM

## 2015-03-27 DIAGNOSIS — D801 Nonfamilial hypogammaglobulinemia: Secondary | ICD-10-CM | POA: Diagnosis not present

## 2015-03-27 LAB — CBC WITH DIFFERENTIAL/PLATELET
BASO%: 0.3 % (ref 0.0–2.0)
BASOS ABS: 0 10*3/uL (ref 0.0–0.1)
EOS%: 0.6 % (ref 0.0–7.0)
Eosinophils Absolute: 0 10*3/uL (ref 0.0–0.5)
HCT: 46 % (ref 38.4–49.9)
HGB: 16.5 g/dL (ref 13.0–17.1)
LYMPH%: 6.7 % — ABNORMAL LOW (ref 14.0–49.0)
MCH: 33.5 pg — AB (ref 27.2–33.4)
MCHC: 35.9 g/dL (ref 32.0–36.0)
MCV: 93.5 fL (ref 79.3–98.0)
MONO#: 0.7 10*3/uL (ref 0.1–0.9)
MONO%: 9.9 % (ref 0.0–14.0)
NEUT#: 5.6 10*3/uL (ref 1.5–6.5)
NEUT%: 82.5 % — AB (ref 39.0–75.0)
Platelets: 83 10*3/uL — ABNORMAL LOW (ref 140–400)
RBC: 4.92 10*6/uL (ref 4.20–5.82)
RDW: 14.7 % — AB (ref 11.0–14.6)
WBC: 6.7 10*3/uL (ref 4.0–10.3)
lymph#: 0.5 10*3/uL — ABNORMAL LOW (ref 0.9–3.3)

## 2015-03-27 MED ORDER — ACETAMINOPHEN 325 MG PO TABS
650.0000 mg | ORAL_TABLET | Freq: Four times a day (QID) | ORAL | Status: DC | PRN
  Administered 2015-03-27: 650 mg via ORAL

## 2015-03-27 MED ORDER — DIPHENHYDRAMINE HCL 25 MG PO CAPS
ORAL_CAPSULE | ORAL | Status: AC
  Filled 2015-03-27: qty 1

## 2015-03-27 MED ORDER — HEPARIN SOD (PORK) LOCK FLUSH 100 UNIT/ML IV SOLN
500.0000 [IU] | Freq: Once | INTRAVENOUS | Status: AC
  Administered 2015-03-27: 500 [IU] via INTRAVENOUS
  Filled 2015-03-27: qty 5

## 2015-03-27 MED ORDER — IMMUNE GLOBULIN (HUMAN) 10 GM/100ML IV SOLN
50.0000 g | Freq: Once | INTRAVENOUS | Status: AC
  Administered 2015-03-27: 50 g via INTRAVENOUS
  Filled 2015-03-27: qty 200

## 2015-03-27 MED ORDER — DIPHENHYDRAMINE HCL 25 MG PO TABS
25.0000 mg | ORAL_TABLET | Freq: Once | ORAL | Status: AC
  Administered 2015-03-27: 25 mg via ORAL
  Filled 2015-03-27: qty 1

## 2015-03-27 MED ORDER — SODIUM CHLORIDE 0.9% FLUSH
10.0000 mL | INTRAVENOUS | Status: DC | PRN
  Administered 2015-03-27: 10 mL via INTRAVENOUS
  Filled 2015-03-27: qty 10

## 2015-03-27 MED ORDER — ACETAMINOPHEN 325 MG PO TABS
ORAL_TABLET | ORAL | Status: AC
  Filled 2015-03-27: qty 2

## 2015-03-27 NOTE — Patient Instructions (Signed)

## 2015-04-07 ENCOUNTER — Other Ambulatory Visit: Payer: Self-pay | Admitting: Sports Medicine

## 2015-04-07 DIAGNOSIS — M545 Low back pain: Secondary | ICD-10-CM

## 2015-04-13 ENCOUNTER — Ambulatory Visit
Admission: RE | Admit: 2015-04-13 | Discharge: 2015-04-13 | Disposition: A | Discharge status: Home / Self Care | Payer: BLUE CROSS/BLUE SHIELD | Source: Ambulatory Visit | Attending: Sports Medicine | Admitting: Sports Medicine

## 2015-04-13 DIAGNOSIS — M545 Low back pain: Secondary | ICD-10-CM

## 2015-04-15 ENCOUNTER — Other Ambulatory Visit: Payer: Self-pay

## 2015-04-15 ENCOUNTER — Other Ambulatory Visit: Payer: Self-pay | Admitting: *Deleted

## 2015-04-15 DIAGNOSIS — C9110 Chronic lymphocytic leukemia of B-cell type not having achieved remission: Secondary | ICD-10-CM

## 2015-04-15 MED ORDER — ACYCLOVIR 400 MG PO TABS: 400.0000 mg | ORAL_TABLET | Freq: Every day | ORAL | Status: DC

## 2015-04-17 ENCOUNTER — Other Ambulatory Visit: Payer: Self-pay

## 2015-04-19 ENCOUNTER — Other Ambulatory Visit: Payer: Self-pay

## 2015-04-20 ENCOUNTER — Other Ambulatory Visit: Payer: Self-pay | Admitting: Occupational Medicine

## 2015-04-20 ENCOUNTER — Ambulatory Visit: Payer: Self-pay

## 2015-04-20 DIAGNOSIS — Z0000 Encounter for general adult medical examination without abnormal findings: Secondary | ICD-10-CM

## 2015-04-24 ENCOUNTER — Other Ambulatory Visit (HOSPITAL_BASED_OUTPATIENT_CLINIC_OR_DEPARTMENT_OTHER): Payer: BLUE CROSS/BLUE SHIELD

## 2015-04-24 ENCOUNTER — Ambulatory Visit (HOSPITAL_BASED_OUTPATIENT_CLINIC_OR_DEPARTMENT_OTHER): Payer: BLUE CROSS/BLUE SHIELD

## 2015-04-24 VITALS — BP 123/58 | HR 61 | Temp 97.8°F | Resp 18

## 2015-04-24 DIAGNOSIS — C9110 Chronic lymphocytic leukemia of B-cell type not having achieved remission: Secondary | ICD-10-CM | POA: Diagnosis not present

## 2015-04-24 DIAGNOSIS — D801 Nonfamilial hypogammaglobulinemia: Secondary | ICD-10-CM

## 2015-04-24 LAB — CBC WITH DIFFERENTIAL/PLATELET
BASO%: 0.8 % (ref 0.0–2.0)
BASOS ABS: 0 10*3/uL (ref 0.0–0.1)
EOS ABS: 0.1 10*3/uL (ref 0.0–0.5)
EOS%: 1.6 % (ref 0.0–7.0)
HCT: 45.2 % (ref 38.4–49.9)
HEMOGLOBIN: 16 g/dL (ref 13.0–17.1)
LYMPH%: 21.7 % (ref 14.0–49.0)
MCH: 34 pg — AB (ref 27.2–33.4)
MCHC: 35.4 g/dL (ref 32.0–36.0)
MCV: 96.2 fL (ref 79.3–98.0)
MONO#: 0.2 10*3/uL (ref 0.1–0.9)
MONO%: 4.1 % (ref 0.0–14.0)
NEUT#: 2.6 10*3/uL (ref 1.5–6.5)
NEUT%: 71.8 % (ref 39.0–75.0)
PLATELETS: 86 10*3/uL — AB (ref 140–400)
RBC: 4.7 10*6/uL (ref 4.20–5.82)
RDW: 15.6 % — AB (ref 11.0–14.6)
WBC: 3.7 10*3/uL — ABNORMAL LOW (ref 4.0–10.3)
lymph#: 0.8 10*3/uL — ABNORMAL LOW (ref 0.9–3.3)

## 2015-04-24 MED ORDER — ACETAMINOPHEN 325 MG PO TABS
650.0000 mg | ORAL_TABLET | Freq: Four times a day (QID) | ORAL | Status: DC | PRN
  Administered 2015-04-24: 650 mg via ORAL

## 2015-04-24 MED ORDER — ACETAMINOPHEN 325 MG PO TABS
ORAL_TABLET | ORAL | Status: AC
  Filled 2015-04-24: qty 2

## 2015-04-24 MED ORDER — DEXTROSE 5 % IV SOLN
Freq: Once | INTRAVENOUS | Status: AC
  Administered 2015-04-24: 10:00:00 via INTRAVENOUS

## 2015-04-24 MED ORDER — HEPARIN SOD (PORK) LOCK FLUSH 100 UNIT/ML IV SOLN
500.0000 [IU] | Freq: Once | INTRAVENOUS | Status: AC
  Administered 2015-04-24: 500 [IU] via INTRAVENOUS
  Filled 2015-04-24: qty 5

## 2015-04-24 MED ORDER — SODIUM CHLORIDE 0.9% FLUSH
10.0000 mL | INTRAVENOUS | Status: DC | PRN
  Administered 2015-04-24: 10 mL via INTRAVENOUS
  Filled 2015-04-24: qty 10

## 2015-04-24 MED ORDER — DIPHENHYDRAMINE HCL 25 MG PO TABS
25.0000 mg | ORAL_TABLET | Freq: Once | ORAL | Status: AC
  Administered 2015-04-24: 25 mg via ORAL
  Filled 2015-04-24: qty 1

## 2015-04-24 MED ORDER — DIPHENHYDRAMINE HCL 25 MG PO CAPS
ORAL_CAPSULE | ORAL | Status: AC
  Filled 2015-04-24: qty 1

## 2015-04-24 MED ORDER — IMMUNE GLOBULIN (HUMAN) 10 GM/100ML IV SOLN
50.0000 g | Freq: Once | INTRAVENOUS | Status: AC
  Administered 2015-04-24: 50 g via INTRAVENOUS
  Filled 2015-04-24: qty 500

## 2015-04-24 NOTE — Patient Instructions (Signed)

## 2015-04-24 NOTE — Progress Notes (Signed)
Pt tolerated infusion well. Pt monitored 30 minutes post infusion. Pt and VS stable at time of d/c.

## 2015-05-12 ENCOUNTER — Encounter: Payer: Self-pay | Admitting: Nurse Practitioner

## 2015-05-12 ENCOUNTER — Ambulatory Visit (INDEPENDENT_AMBULATORY_CARE_PROVIDER_SITE_OTHER): Payer: BLUE CROSS/BLUE SHIELD | Admitting: Nurse Practitioner

## 2015-05-12 VITALS — BP 112/70 | HR 54 | Ht 72.0 in | Wt 211.0 lb

## 2015-05-12 DIAGNOSIS — I2510 Atherosclerotic heart disease of native coronary artery without angina pectoris: Secondary | ICD-10-CM

## 2015-05-12 DIAGNOSIS — I10 Essential (primary) hypertension: Secondary | ICD-10-CM | POA: Diagnosis not present

## 2015-05-12 DIAGNOSIS — E785 Hyperlipidemia, unspecified: Secondary | ICD-10-CM

## 2015-05-12 DIAGNOSIS — R5383 Other fatigue: Secondary | ICD-10-CM

## 2015-05-12 LAB — LIPID PANEL
Cholesterol: 141 mg/dL (ref 125–200)
HDL: 33 mg/dL — ABNORMAL LOW (ref >=40)
LDL Cholesterol: 89 mg/dL (ref <130)
Total CHOL/HDL Ratio: 4.3 Ratio (ref <=5.0)
Triglycerides: 95 mg/dL (ref <150)
VLDL: 19 mg/dL (ref <30)

## 2015-05-12 LAB — HEPATIC FUNCTION PANEL
ALT: 17 U/L (ref 9–46)
AST: 15 U/L (ref 10–35)
Albumin: 4.2 g/dL (ref 3.6–5.1)
Alkaline Phosphatase: 81 U/L (ref 40–115)
Bilirubin, Direct: 0.2 mg/dL (ref <=0.2)
Indirect Bilirubin: 0.5 mg/dL (ref 0.2–1.2)
Total Bilirubin: 0.7 mg/dL (ref 0.2–1.2)
Total Protein: 6.7 g/dL (ref 6.1–8.1)

## 2015-05-12 LAB — BASIC METABOLIC PANEL
BUN: 11 mg/dL (ref 7–25)
CO2: 25 mmol/L (ref 20–31)
Calcium: 9.2 mg/dL (ref 8.6–10.3)
Chloride: 105 mmol/L (ref 98–110)
Creat: 0.88 mg/dL (ref 0.70–1.25)
Glucose, Bld: 164 mg/dL — ABNORMAL HIGH (ref 65–99)
Potassium: 4.4 mmol/L (ref 3.5–5.3)
Sodium: 138 mmol/L (ref 135–146)

## 2015-05-12 MED ORDER — NITROGLYCERIN 0.4 MG SL SUBL: 0.4000 mg | SUBLINGUAL_TABLET | SUBLINGUAL | Status: DC | PRN

## 2015-05-12 MED ORDER — NICODERM CQ 21 MG/24HR TD PT24: 21.0000 mg | MEDICATED_PATCH | Freq: Every day | TRANSDERMAL | Status: DC

## 2015-05-12 MED ORDER — METOPROLOL SUCCINATE ER 25 MG PO TB24: 25.0000 mg | ORAL_TABLET | Freq: Every day | ORAL | Status: DC

## 2015-05-12 NOTE — Progress Notes (Signed)
CARDIOLOGY OFFICE NOTE  Date:  05/12/2015    Ryan Copeland Date of Birth: Apr 08, 1950 Medical Record #[id number]  PCP:  Tula Nakayama  Cardiologist:  Nah[place]    Chief Complaint  Patient presents with  . Coronary Artery Disease  . Hyperlipidemia  . Hypertension    6 month check - seen for Dr. Acie Fredrickson    History of Present Illness:  Ryan Copeland is a 65 y.o. male who presents today for a 6 month check. Seen for Dr. Acie Fredrickson. Former patient of Dr. Susa Simmonds.   Has known prior NSTEMI and stenting of the LAD in 2011. Underwent repeat cath due to refractory symptoms back in August of 2016 - managed medically. Other issues include tobacco abuse, PVD with past iliac stenting, HTN, HLD, DM, chronic thrombocytopenia and CLL .  Comes back today. Here alone. Doing ok. Has switched jobs - he is happier. No chest pain. Breathing is good. Tolerating his medicines. Still struggles with his smoking but says "I'm working on it". Wants to continue to try and use the patches.   Past Medical History  Diagnosis Date  . MI, acute, non ST segment elevation ([place]) 06/28/2009    with stenting of the LAD  . Hyperlipidemia   . Tobacco abuse   . PVD (peripheral vascular disease) ([place])   . CAD (coronary artery disease)   . Anxiety 01/30/2014  . Diabetes mellitus ([place])     Type 2   . GERD (gastroesophageal reflux disease)     takes Nexium if needed  . Neuromuscular disorder ([place])     peripheral neuropathy  . CLL (chronic lymphocytic leukemia) ([place]) 03/18/2011  . ECRB (extensor carpi radialis brevis) tenosynovitis   . Lateral epicondylitis of left elbow     Past Surgical History  Procedure Laterality Date  . Femoral stents    . Coronary stent placement  May 2011  . Cholecystectomy  2007  . Carpel tunnel release Left [id number]  . Carpel tunnel release  Right 01-1989  . Tarsal tunnel release Bilateral 08-2007  . Left cai stent/pta and popliteal artery/tibial thrombectomy     . Left heart  catheterization with coronary angiogram N/A 08/26/2011    Procedure: LEFT HEART CATHETERIZATION WITH CORONARY ANGIOGRAM;  Surgeon: Peter M Martinique, MD;  Location[facility] C[facility] LAB;  Service: Cardiovascular;  Laterality: N/A;  . Cardiac catheterization    . Adenoidectomy  1955  . Lateral epicondyle release Left 02/12/2014    Procedure: LEFT ELBOW DEBRIDEMENT WITH TENDON REPAIR ;  Surgeon: Lorn Junes, MD;  Location: [facility];  Service: Orthopedics;  Laterality: Left;  . Cardiac catheterization N/A 09/26/2014    Procedure: Left Heart Cath and Coronary Angiography;  Surgeon: Peter M Martinique, MD;  Location: [facility] CV LAB;  Service: Cardiovascular;  Laterality: N/A;  . Peripheral vascular catheterization N/A 01/01/2015    Procedure: Abdominal Aortogram;  Surgeon: Conrad Troy, MD;  Location: [place] CV LAB;  Service: Cardiovascular;  Laterality: N/A;     Medications: Current Outpatient Prescriptions  Medication Sig Dispense Refill  . acetaminophen (TYLENOL) 500 MG tablet Take 500 mg by mouth every 6 (six) hours as needed for mild pain, moderate pain or headache.    Marland Kitchen acyclovir (ZOVIRAX) 400 MG tablet Take 1 tablet (400 mg total) by mouth daily. 90 tablet 3  . ALPRAZolam (XANAX) 0.25 MG tablet Take 1 po q 6 hours prn for anxiety (Patient taking differently: Take 0.25 mg by mouth 2 (two) times daily  as needed for anxiety. ) 60 tablet 0  . aspirin 325 MG tablet Take 325 mg by mouth daily.      . COMBIVENT RESPIMAT 20-100 MCG/ACT AERS respimat 1 puff every 6 (six) hours as needed for wheezing or shortness of breath.   0  . cyanocobalamin (,VITAMIN B-12,) 1000 MCG/ML injection Inject 1 mL (1,000 mcg total) into the muscle every 30 (thirty) days. 3 mL 1  . esomeprazole (NEXIUM) 40 MG capsule Take 1 capsule (40 mg total) by mouth 2 (two) times daily. 180 capsule 3  . fluticasone (FLOVENT HFA) 110 MCG/ACT inhaler Inhale 1 puff into the lungs as needed. (Patient taking differently: Inhale 1 puff into the  lungs as needed (shortness of breath). ) 1 Inhaler 1  . HYDROcodone-acetaminophen (NORCO) 10-325 MG tablet take 1 tablet by mouth every 6 to 8 hours if needed for pain  0  . HYDROcodone-acetaminophen (NORCO/VICODIN) 5-325 MG tablet Take 1 tablet by mouth every 6 (six) hours as needed for moderate pain.   0  . lidocaine-prilocaine (EMLA) cream apply to affected area topically if needed 30 g 3  . meclizine (ANTIVERT) 12.5 MG tablet Take 12.5 mg by mouth every 6 (six) hours as needed for dizziness.   0  . metFORMIN (GLUCOPHAGE-XR) 500 MG 24 hr tablet Take 500 mg by mouth 2 (two) times daily.    . metoprolol succinate (TOPROL XL) 25 MG 24 hr tablet Take 1 tablet (25 mg total) by mouth daily. 90 tablet 3  . NICODERM CQ 21 MG/24HR patch Place 1 patch (21 mg total) onto the skin daily. 30 patch 2  . nitroGLYCERIN (NITROSTAT) 0.4 MG SL tablet Place 1 tablet (0.4 mg total) under the tongue every 5 (five) minutes as needed for chest pain. 25 tablet 3  . pregabalin (LYRICA) 75 MG capsule 1 cap three times daily and 2 caps at bedtime 450 capsule 1  . rosuvastatin (CRESTOR) 5 MG tablet Take 1 tablet (5 mg total) by mouth daily. 90 tablet 3  . sildenafil (VIAGRA) 100 MG tablet Take 1 tablet (100 mg total) by mouth as needed for erectile dysfunction. 10 tablet 6  . tetrahydrozoline 0.05 % ophthalmic solution Place 1 drop into both eyes as needed. Red eyes    . topiramate (TOPAMAX) 25 MG tablet '50mg'$  twice daily for 2 weeks then increase to 3 tabs twice daily if necessary 540 tablet 2  . zolpidem (AMBIEN) 10 MG tablet Take 10 mg by mouth at bedtime as needed for sleep.   0   No current facility-administered medications for this visit.   Facility-Administered Medications Ordered in Other Visits  Medication Dose Route Frequency Provider Last Rate Last Dose  . 0.9 %  sodium chloride infusion   Intravenous Continuous [name], MD 50 mL/hr at 03/07/14 1005    . acetaminophen (TYLENOL) tablet 650 mg  650 mg Oral Q6H  PRN [name], MD   650 mg at 03/07/14 1012  . sodium chloride 0.9 % injection 10 mL  10 mL Intracatheter PRN Consuela Mimes, MD   10 mL at 08/22/12 1721  . sodium chloride 0.9 % injection 10 mL  10 mL Intravenous PRN Consuela Mimes, MD   10 mL at 05/29/13 1323    Allergies: Allergies  Allergen Reactions  . Codeine Hives    Pt states he can take a few, more reaction with extended doses.    Social History: The patient  reports that he has been smoking Cigarettes.  He has  a 22 pack-year smoking history. He has quit using smokeless tobacco. He reports that he does not drink alcohol or use illicit drugs.   Family History: The patient's family history includes Cancer in his mother; Heart disease in his father; Heart failure (age of onset: 82) in his father; Lung cancer (age of onset: 14) in his mother.   Review of Systems: Please see the history of present illness.   Otherwise, the review of systems is positive for none.   All other systems are reviewed and negative.   Physical Exam: VS:  BP 112/70 mmHg  Pulse 54  Ht 6' (1.829 m)  Wt 211 lb (95.709 kg)  BMI 28.61 kg/m2 .  BMI Body mass index is 28.61 kg/(m^2).  Wt Readings from Last 3 Encounters:  05/12/15 211 lb (95.709 kg)  01/23/15 213 lb (96.616 kg)  01/06/15 219 lb 3.2 oz (99.428 kg)    General: Pleasant. Well developed, well nourished and in no acute distress.  HEENT: Normal. Neck: Supple, no JVD, carotid bruits, or masses noted.  Cardiac: Regular rate and rhythm. No murmurs, rubs, or gallops. No edema.  Respiratory:  Lungs are clear to auscultation bilaterally with normal work of breathing.  GI: Soft and nontender.  MS: No deformity or atrophy. Gait and ROM intact. Skin: Warm and dry. Color is normal.  Neuro:  Strength and sensation are intact and no gross focal deficits noted.  Psych: Alert, appropriate and with normal affect.   LABORATORY DATA:  EKG:  EKG is not ordered today.  Lab Results  Component Value Date    WBC 3.7* 04/24/2015   HGB 16.0 04/24/2015   HCT 45.2 04/24/2015   PLT 86* 04/24/2015   GLUCOSE 269* 01/02/2015   CHOL 134 08/15/2014   TRIG 151.0* 08/15/2014   HDL 29.10* 08/15/2014   LDLDIRECT 117.6 08/22/2011   LDLCALC 75 08/15/2014   ALT 17 01/02/2015   AST 14 01/02/2015   NA 139 01/02/2015   K 4.5 01/02/2015   CL 101 01/01/2015   CREATININE 1.0 01/02/2015   BUN 8.1 01/02/2015   CO2 25 01/02/2015   TSH 2.00 08/15/2014   INR 1.0 09/22/2014   HGBA1C 7.5* 07/26/2013    BNP (last 3 results) No results for input(s): BNP in the last 8760 hours.  ProBNP (last 3 results) No results for input(s): PROBNP in the last 8760 hours.   Other Studies Reviewed Today:  Coronary angiography from 09/2014:  Coronary dominance: right  Left mainstem: Normal.  Left anterior descending (LAD): The stent in the proximal LAD is widely patent throughout. There is moderate 20% narrowing in the LAD proximal to the stent. The remainder of the vessels without significant disease.  Left circumflex (LCx): The left circumflex gives rise to 2 large marginal branches. There is 20% narrowing prior to the takeoff of the first OM.  Right coronary artery (RCA): The right coronary is a dominant vessel. It has diffuse 70% disease in the proximal to mid vessel. Is occluded at the crux. There are excellent right to right and left to right collaterals.  Left ventriculography: Left ventricular systolic function is normal, LVEF is estimated at 55-65%, there is no significant mitral regurgitation  Final Conclusions:  1. Single vessel obstructive coronary disease. 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 The stent in the proximal LAD is widely patent.  2. Normal LV function.  Recommendations: Continue medical management.  Peter Martinique  08/26/2011, 9:09 AM   Assessment /  Plan: 1. CAD - last cath with stable findings back in August of 2016. Continue with medical  management.   2. HTN - BP good on his current therapy.   3. HLD - on statin therapy - recheck lab today.   4. ED - uses Viagra prn.  5. Tobacco abuse - continues to struggle with stopping. Trying the patches again - refill sent in. Cessation encouraged. Smoking cessation is encouraged. 3 minutes were spent at today's visit discussing smoking cessation - reasons to stop, ways to quit, benefits, etc.   6. CLL/chronic thrombocytopenia - followed by oncology/hematology.   7. NIDDM - recheck A1C today.   Current medicines are reviewed with the patient today.  The patient does not have concerns regarding medicines other than what has been noted above.  The following changes have been made:  See above.  Labs/ tests ordered today include:    Orders Placed This Encounter  Procedures  . Basic metabolic panel  . Hepatic function panel  . Lipid panel  . Hemoglobin A1c     Disposition:   FU with me in 6 months.   Patient is agreeable to this plan and will call if any problems develop in the interim.   Signed: Burtis Junes, RN, ANP-C 05/12/2015 8:38 AM  [facility] [address] [place], [place]  [address] Phone: [phone] Fax: [phone]

## 2015-05-12 NOTE — Patient Instructions (Addendum)
We will be checking the following labs today - BMET, A1C, lipids and HPF   Medication Instructions:    Continue with your current medicines.   I refilled the NTG, the metoprolol and your Nicoderm patches    Testing/Procedures To Be Arranged:  N/A  Follow-Up:   See me in 6 months    Other Special Instructions:   Keep working on your smoking    If you need a refill on your cardiac medications before your next appointment, please call your pharmacy.   Call the [place] office at [phone] if you have any questions, problems or concerns.

## 2015-05-13 LAB — HEMOGLOBIN A1C
Hgb A1c MFr Bld: 6.1 % — ABNORMAL HIGH (ref <5.7)
Mean Plasma Glucose: 128 mg/dL

## 2015-05-22 ENCOUNTER — Ambulatory Visit: Payer: Self-pay

## 2015-05-22 ENCOUNTER — Other Ambulatory Visit: Payer: Self-pay | Admitting: *Deleted

## 2015-05-22 ENCOUNTER — Other Ambulatory Visit: Payer: Self-pay

## 2015-05-22 ENCOUNTER — Telehealth: Payer: Self-pay | Admitting: Hematology and Oncology

## 2015-05-22 NOTE — Telephone Encounter (Signed)
S.w. Pt and advised on 4.14 appts...the patient ok and aware

## 2015-05-26 ENCOUNTER — Telehealth: Payer: Self-pay | Admitting: Hematology and Oncology

## 2015-05-26 NOTE — Telephone Encounter (Signed)
per pof to sch pt appt-pt left voicmail wanting to move to an earlier time-adv no earlier time avaiable

## 2015-05-29 ENCOUNTER — Ambulatory Visit (HOSPITAL_BASED_OUTPATIENT_CLINIC_OR_DEPARTMENT_OTHER): Payer: BLUE CROSS/BLUE SHIELD

## 2015-05-29 ENCOUNTER — Other Ambulatory Visit (HOSPITAL_BASED_OUTPATIENT_CLINIC_OR_DEPARTMENT_OTHER): Payer: BLUE CROSS/BLUE SHIELD

## 2015-05-29 VITALS — BP 116/65 | HR 56 | Temp 98.0°F | Resp 18

## 2015-05-29 DIAGNOSIS — C9110 Chronic lymphocytic leukemia of B-cell type not having achieved remission: Secondary | ICD-10-CM

## 2015-05-29 DIAGNOSIS — D801 Nonfamilial hypogammaglobulinemia: Secondary | ICD-10-CM

## 2015-05-29 LAB — CBC WITH DIFFERENTIAL/PLATELET
BASO%: 0.5 % (ref 0.0–2.0)
BASOS ABS: 0 10*3/uL (ref 0.0–0.1)
EOS ABS: 0 10*3/uL (ref 0.0–0.5)
EOS%: 0.3 % (ref 0.0–7.0)
HCT: 47.7 % (ref 38.4–49.9)
HEMOGLOBIN: 16.2 g/dL (ref 13.0–17.1)
LYMPH%: 10.8 % — AB (ref 14.0–49.0)
MCH: 33.5 pg — AB (ref 27.2–33.4)
MCHC: 34 g/dL (ref 32.0–36.0)
MCV: 98.6 fL — AB (ref 79.3–98.0)
MONO#: 0.5 10*3/uL (ref 0.1–0.9)
MONO%: 6.4 % (ref 0.0–14.0)
NEUT#: 5.9 10*3/uL (ref 1.5–6.5)
NEUT%: 82 % — ABNORMAL HIGH (ref 39.0–75.0)
Platelets: 84 10*3/uL — ABNORMAL LOW (ref 140–400)
RBC: 4.84 10*6/uL (ref 4.20–5.82)
RDW: 15.2 % — AB (ref 11.0–14.6)
WBC: 7.2 10*3/uL (ref 4.0–10.3)
lymph#: 0.8 10*3/uL — ABNORMAL LOW (ref 0.9–3.3)

## 2015-05-29 MED ORDER — ACETAMINOPHEN 325 MG PO TABS
650.0000 mg | ORAL_TABLET | Freq: Four times a day (QID) | ORAL | Status: DC | PRN
  Administered 2015-05-29: 650 mg via ORAL

## 2015-05-29 MED ORDER — DIPHENHYDRAMINE HCL 25 MG PO CAPS
ORAL_CAPSULE | ORAL | Status: AC
  Filled 2015-05-29: qty 1

## 2015-05-29 MED ORDER — HEPARIN SOD (PORK) LOCK FLUSH 100 UNIT/ML IV SOLN
500.0000 [IU] | Freq: Once | INTRAVENOUS | Status: AC
  Administered 2015-05-29: 500 [IU]
  Filled 2015-05-29: qty 5

## 2015-05-29 MED ORDER — ACETAMINOPHEN 325 MG PO TABS
ORAL_TABLET | ORAL | Status: AC
  Filled 2015-05-29: qty 2

## 2015-05-29 MED ORDER — IMMUNE GLOBULIN (HUMAN) 10 GM/100ML IV SOLN
50.0000 g | Freq: Once | INTRAVENOUS | Status: AC
  Administered 2015-05-29: 50 g via INTRAVENOUS
  Filled 2015-05-29: qty 400

## 2015-05-29 MED ORDER — SODIUM CHLORIDE 0.9 % IJ SOLN
10.0000 mL | Freq: Once | INTRAMUSCULAR | Status: AC
  Administered 2015-05-29: 10 mL
  Filled 2015-05-29: qty 10

## 2015-05-29 MED ORDER — DIPHENHYDRAMINE HCL 25 MG PO TABS
25.0000 mg | ORAL_TABLET | Freq: Once | ORAL | Status: AC
  Administered 2015-05-29: 25 mg via ORAL
  Filled 2015-05-29: qty 1

## 2015-05-29 NOTE — Patient Instructions (Signed)

## 2015-06-01 ENCOUNTER — Encounter: Payer: Self-pay | Admitting: Hematology and Oncology

## 2015-06-01 ENCOUNTER — Telehealth: Payer: Self-pay | Admitting: Hematology and Oncology

## 2015-06-01 ENCOUNTER — Other Ambulatory Visit: Payer: Self-pay | Admitting: Hematology and Oncology

## 2015-06-01 NOTE — Telephone Encounter (Signed)
lvm for pt regarding to 5.5 appt moved to 5.12 per MD request..Marland KitchenMarland Kitchen

## 2015-06-19 ENCOUNTER — Other Ambulatory Visit: Payer: Self-pay

## 2015-06-19 ENCOUNTER — Ambulatory Visit: Payer: Self-pay

## 2015-06-19 ENCOUNTER — Ambulatory Visit: Payer: Self-pay | Admitting: Hematology and Oncology

## 2015-06-26 ENCOUNTER — Telehealth: Payer: Self-pay | Admitting: Hematology and Oncology

## 2015-06-26 ENCOUNTER — Ambulatory Visit (HOSPITAL_COMMUNITY)
Admission: RE | Admit: 2015-06-26 | Discharge: 2015-06-26 | Disposition: A | Discharge status: Home / Self Care | Payer: BLUE CROSS/BLUE SHIELD | Source: Ambulatory Visit | Attending: Hematology and Oncology | Admitting: Hematology and Oncology

## 2015-06-26 ENCOUNTER — Ambulatory Visit (HOSPITAL_BASED_OUTPATIENT_CLINIC_OR_DEPARTMENT_OTHER): Payer: BLUE CROSS/BLUE SHIELD | Admitting: Hematology and Oncology

## 2015-06-26 ENCOUNTER — Ambulatory Visit (HOSPITAL_BASED_OUTPATIENT_CLINIC_OR_DEPARTMENT_OTHER): Payer: BLUE CROSS/BLUE SHIELD

## 2015-06-26 ENCOUNTER — Encounter: Payer: Self-pay | Admitting: Hematology and Oncology

## 2015-06-26 ENCOUNTER — Other Ambulatory Visit (HOSPITAL_BASED_OUTPATIENT_CLINIC_OR_DEPARTMENT_OTHER): Payer: BLUE CROSS/BLUE SHIELD

## 2015-06-26 VITALS — BP 125/61 | HR 59 | Resp 18

## 2015-06-26 VITALS — BP 128/73 | HR 62 | Temp 98.0°F | Resp 18 | Ht 72.0 in | Wt 214.3 lb

## 2015-06-26 DIAGNOSIS — D751 Secondary polycythemia: Secondary | ICD-10-CM | POA: Diagnosis not present

## 2015-06-26 DIAGNOSIS — D801 Nonfamilial hypogammaglobulinemia: Secondary | ICD-10-CM | POA: Diagnosis not present

## 2015-06-26 DIAGNOSIS — C9110 Chronic lymphocytic leukemia of B-cell type not having achieved remission: Secondary | ICD-10-CM | POA: Diagnosis not present

## 2015-06-26 DIAGNOSIS — D696 Thrombocytopenia, unspecified: Secondary | ICD-10-CM | POA: Diagnosis not present

## 2015-06-26 DIAGNOSIS — E1151 Type 2 diabetes mellitus with diabetic peripheral angiopathy without gangrene: Secondary | ICD-10-CM

## 2015-06-26 DIAGNOSIS — Z515 Encounter for palliative care: Secondary | ICD-10-CM | POA: Insufficient documentation

## 2015-06-26 DIAGNOSIS — Z720 Tobacco use: Secondary | ICD-10-CM

## 2015-06-26 LAB — CBC WITH DIFFERENTIAL/PLATELET
BASO%: 0.4 % (ref 0.0–2.0)
Basophils Absolute: 0 10*3/uL (ref 0.0–0.1)
EOS ABS: 0 10*3/uL (ref 0.0–0.5)
EOS%: 0.5 % (ref 0.0–7.0)
HEMATOCRIT: 51.8 % — AB (ref 38.4–49.9)
HEMOGLOBIN: 17.3 g/dL — AB (ref 13.0–17.1)
LYMPH#: 0.7 10*3/uL — AB (ref 0.9–3.3)
LYMPH%: 10.2 % — ABNORMAL LOW (ref 14.0–49.0)
MCH: 32.6 pg (ref 27.2–33.4)
MCHC: 33.3 g/dL (ref 32.0–36.0)
MCV: 97.8 fL (ref 79.3–98.0)
MONO#: 0.6 10*3/uL (ref 0.1–0.9)
MONO%: 8.7 % (ref 0.0–14.0)
NEUT%: 80.2 % — ABNORMAL HIGH (ref 39.0–75.0)
NEUTROS ABS: 5.8 10*3/uL (ref 1.5–6.5)
Platelets: 88 10*3/uL — ABNORMAL LOW (ref 140–400)
RBC: 5.29 10*6/uL (ref 4.20–5.82)
RDW: 14.3 % (ref 11.0–14.6)
WBC: 7.2 10*3/uL (ref 4.0–10.3)

## 2015-06-26 MED ORDER — IMMUNE GLOBULIN (HUMAN) 10 GM/100ML IV SOLN
0.5000 g/kg | Freq: Once | INTRAVENOUS | Status: AC
  Administered 2015-06-26: 50 g via INTRAVENOUS
  Filled 2015-06-26: qty 200

## 2015-06-26 MED ORDER — SODIUM CHLORIDE 0.9 % IV SOLN
Freq: Once | INTRAVENOUS | Status: AC
  Administered 2015-06-26: 10:00:00 via INTRAVENOUS

## 2015-06-26 MED ORDER — ACETAMINOPHEN 325 MG PO TABS
650.0000 mg | ORAL_TABLET | Freq: Four times a day (QID) | ORAL | Status: DC | PRN
  Administered 2015-06-26: 650 mg via ORAL

## 2015-06-26 MED ORDER — ACETAMINOPHEN 325 MG PO TABS
ORAL_TABLET | ORAL | Status: AC
  Filled 2015-06-26: qty 2

## 2015-06-26 MED ORDER — DIPHENHYDRAMINE HCL 25 MG PO CAPS
ORAL_CAPSULE | ORAL | Status: AC
  Filled 2015-06-26: qty 1

## 2015-06-26 MED ORDER — SODIUM CHLORIDE 0.9% FLUSH
10.0000 mL | INTRAVENOUS | Status: DC | PRN
  Administered 2015-06-26: 10 mL via INTRAVENOUS
  Filled 2015-06-26: qty 10

## 2015-06-26 MED ORDER — DIPHENHYDRAMINE HCL 25 MG PO TABS
25.0000 mg | ORAL_TABLET | Freq: Once | ORAL | Status: AC
  Administered 2015-06-26: 25 mg via ORAL
  Filled 2015-06-26: qty 1

## 2015-06-26 MED ORDER — HEPARIN SOD (PORK) LOCK FLUSH 100 UNIT/ML IV SOLN
500.0000 [IU] | Freq: Once | INTRAVENOUS | Status: AC
  Administered 2015-06-26: 500 [IU] via INTRAVENOUS
  Filled 2015-06-26: qty 5

## 2015-06-26 NOTE — Assessment & Plan Note (Signed)
We discussed increase physical activity and possible enrollment with the [employer] program with the [facility]. I gave him additional resources from the [facility]. We discussed importance of vitamin D supplementation. I reinforced the importance of getting advanced directives in place

## 2015-06-26 NOTE — Assessment & Plan Note (Signed)
This is likely secondary to dehydration and smoking. Again, we discussed importance of smoking cessation and I recommend he drinks plenty of water before his blood draw next month There is no need for phlebotomy for now He is on aspirin therapy

## 2015-06-26 NOTE — Telephone Encounter (Signed)
Gave and printed appt sched and vs for pt for June thru [facility]

## 2015-06-26 NOTE — Progress Notes (Signed)
[place] OFFICE PROGRESS NOTE  Patient Care Team: [name], PA-C as PCP - General (Family Medicine) Burtis Junes, NP as Nurse Practitioner (Cardiology)  SUMMARY OF ONCOLOGIC HISTORY:  This patient was originally diagnosed with CLL after he was found to have leukocytosis. Imaging study with CT scan show diffuse lymphadenopathy with splenomegaly. Due to worsening thrombocytopenia, he was subsequently treated with combination therapy with fludarabine, Cytoxan and rituximab from May 2014 to October 2014. The patient denies side effects or complications from treatment. He has significant smoking history with suspected COPD. 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 From October 2014 onwards, he was prescribed monthly IVIG infusion.  INTERVAL HISTORY: Please see below for problem oriented charting. He returns today for follow-up on CLL and monthly IVIG treatment for acquire hypogammaglobulinemia He feels well apart from chronic fatigue. He takes multiple medications for peripheral neuropathy secondary to diabetes. He denies recent infection. He continues to smoke and is attempting to quit smoking. Denies new lymphadenopathy  REVIEW OF SYSTEMS:   Constitutional: Denies fevers, chills or abnormal weight loss Eyes: Denies blurriness of vision Ears, nose, mouth, throat, and face: Denies mucositis or sore throat Respiratory: Denies cough, dyspnea or wheezes Cardiovascular: Denies palpitation, chest discomfort or lower extremity swelling Gastrointestinal:  Denies nausea, heartburn or change in bowel habits Skin: Denies abnormal skin rashes Lymphatics: Denies new lymphadenopathy or easy bruising Neurological:Denies numbness, tingling or new weaknesses Behavioral/Psych: Mood is stable, no new changes  All other systems were reviewed with the patient and are negative.  I have reviewed the past medical history, past  surgical history, social history and family history with the patient and they are unchanged from previous note.  ALLERGIES:  is allergic to codeine.  MEDICATIONS:  Current Outpatient Prescriptions  Medication Sig Dispense Refill  . acetaminophen (TYLENOL) 500 MG tablet Take 500 mg by mouth every 6 (six) hours as needed for mild pain, moderate pain or headache.    Marland Kitchen acyclovir (ZOVIRAX) 400 MG tablet Take 1 tablet (400 mg total) by mouth daily. 90 tablet 3  . ALPRAZolam (XANAX) 0.25 MG tablet Take 1 po q 6 hours prn for anxiety (Patient taking differently: Take 0.25 mg by mouth 2 (two) times daily as needed for anxiety. ) 60 tablet 0  . aspirin 325 MG tablet Take 325 mg by mouth daily.      . COMBIVENT RESPIMAT 20-100 MCG/ACT AERS respimat 1 puff every 6 (six) hours as needed for wheezing or shortness of breath.   0  . cyanocobalamin (,VITAMIN B-12,) 1000 MCG/ML injection Inject 1 mL (1,000 mcg total) into the muscle every 30 (thirty) days. 3 mL 1  . esomeprazole (NEXIUM) 40 MG capsule Take 1 capsule (40 mg total) by mouth 2 (two) times daily. 180 capsule 3  . fluticasone (FLOVENT HFA) 110 MCG/ACT inhaler Inhale 1 puff into the lungs as needed. (Patient taking differently: Inhale 1 puff into the lungs as needed (shortness of breath). ) 1 Inhaler 1  . HYDROcodone-acetaminophen (NORCO) 10-325 MG tablet take 1 tablet by mouth every 6 to 8 hours if needed for pain  0  . HYDROcodone-acetaminophen (NORCO/VICODIN) 5-325 MG tablet Take 1 tablet by mouth every 6 (six) hours as needed for moderate pain.   0  . lidocaine-prilocaine (EMLA) cream apply to affected area topically if needed 30 g 3  . meclizine (ANTIVERT) 12.5 MG tablet Take 12.5 mg by mouth every 6 (six) hours as needed for dizziness.  0  . metFORMIN (GLUCOPHAGE-XR) 500 MG 24 hr tablet Take 500 mg by mouth 2 (two) times daily.    . metoprolol succinate (TOPROL XL) 25 MG 24 hr tablet Take 1 tablet (25 mg total) by mouth daily. 90 tablet 3  .  NICODERM CQ 21 MG/24HR patch Place 1 patch (21 mg total) onto the skin daily. 30 patch 2  . nitroGLYCERIN (NITROSTAT) 0.4 MG SL tablet Place 1 tablet (0.4 mg total) under the tongue every 5 (five) minutes as needed for chest pain. 25 tablet 3  . pregabalin (LYRICA) 75 MG capsule 1 cap three times daily and 2 caps at bedtime 450 capsule 1  . rosuvastatin (CRESTOR) 5 MG tablet Take 1 tablet (5 mg total) by mouth daily. 90 tablet 3  . sildenafil (VIAGRA) 100 MG tablet Take 1 tablet (100 mg total) by mouth as needed for erectile dysfunction. 10 tablet 6  . tetrahydrozoline 0.05 % ophthalmic solution Place 1 drop into both eyes as needed. Red eyes    . topiramate (TOPAMAX) 25 MG tablet '50mg'$  twice daily for 2 weeks then increase to 3 tabs twice daily if necessary 540 tablet 2  . zolpidem (AMBIEN) 10 MG tablet Take 10 mg by mouth at bedtime as needed for sleep.   0   No current facility-administered medications for this visit.   Facility-Administered Medications Ordered in Other Visits  Medication Dose Route Frequency Provider Last Rate Last Dose  . 0.9 %  sodium chloride infusion   Intravenous Continuous [name], MD 50 mL/hr at 03/07/14 1005    . acetaminophen (TYLENOL) tablet 650 mg  650 mg Oral Q6H PRN [name], MD   650 mg at 03/07/14 1012  . sodium chloride 0.9 % injection 10 mL  10 mL Intracatheter PRN Consuela Mimes, MD   10 mL at 08/22/12 1721  . sodium chloride 0.9 % injection 10 mL  10 mL Intravenous PRN Consuela Mimes, MD   10 mL at 05/29/13 1323    PHYSICAL EXAMINATION: ECOG PERFORMANCE STATUS: 0 - Asymptomatic  Filed Vitals:   06/26/15 0900  BP: 128/73  Pulse: 62  Temp: 98 F (36.7 C)  Resp: 18   Filed Weights   06/26/15 0900  Weight: 214 lb 4.8 oz (97.206 kg)    GENERAL:alert, no distress and comfortable SKIN: skin color, texture, turgor are normal, no rashes or significant lesions EYES: normal, Conjunctiva are pink and non-injected, sclera clear OROPHARYNX:no exudate,  no erythema and lips, buccal mucosa, and tongue normal  NECK: supple, thyroid normal size, non-tender, without nodularity LYMPH:  no palpable lymphadenopathy in the cervical, axillary or inguinal LUNGS: clear to auscultation and percussion with normal breathing effort HEART: regular rate & rhythm and no murmurs and no lower extremity edema ABDOMEN:abdomen soft, non-tender and normal bowel sounds Musculoskeletal:no cyanosis of digits and no clubbing  NEURO: alert & oriented x 3 with fluent speech, no focal motor/sensory deficits  LABORATORY DATA:  I have reviewed the data as listed    Component Value Date/Time   NA 138 05/12/2015 0844   NA 139 01/02/2015 0919   K 4.4 05/12/2015 0844   K 4.5 01/02/2015 0919   CL 105 05/12/2015 0844   CL 104 08/03/2012 1229   CO2 25 05/12/2015 0844   CO2 25 01/02/2015 0919   GLUCOSE 164* 05/12/2015 0844   GLUCOSE 269* 01/02/2015 0919   GLUCOSE 223* 08/03/2012 1229   BUN 11 05/12/2015 0844   BUN 8.1 01/02/2015 0919   CREATININE  0.88 05/12/2015 0844   CREATININE 1.0 01/02/2015 0919   CREATININE 0.80 01/01/2015 0900   CALCIUM 9.2 05/12/2015 0844   CALCIUM 9.5 01/02/2015 0919   PROT 6.7 05/12/2015 0844   PROT 6.5 01/02/2015 0919   ALBUMIN 4.2 05/12/2015 0844   ALBUMIN 3.9 01/02/2015 0919   AST 15 05/12/2015 0844   AST 14 01/02/2015 0919   ALT 17 05/12/2015 0844   ALT 17 01/02/2015 0919   ALKPHOS 81 05/12/2015 0844   ALKPHOS 98 01/02/2015 0919   BILITOT 0.7 05/12/2015 0844   BILITOT 0.67 01/02/2015 0919   GFRNONAA >90 02/03/2014 1355   GFRAA >90 02/03/2014 1355    No results found for: SPEP, UPEP  Lab Results  Component Value Date   WBC 7.2 06/26/2015   NEUTROABS 5.8 06/26/2015   HGB 17.3* 06/26/2015   HCT 51.8* 06/26/2015   MCV 97.8 06/26/2015   PLT 88* 06/26/2015      Chemistry      Component Value Date/Time   NA 138 05/12/2015 0844   NA 139 01/02/2015 0919   K 4.4 05/12/2015 0844   K 4.5 01/02/2015 0919   CL 105 05/12/2015  0844   CL 104 08/03/2012 1229   CO2 25 05/12/2015 0844   CO2 25 01/02/2015 0919   BUN 11 05/12/2015 0844   BUN 8.1 01/02/2015 0919   CREATININE 0.88 05/12/2015 0844   CREATININE 1.0 01/02/2015 0919   CREATININE 0.80 01/01/2015 0900      Component Value Date/Time   CALCIUM 9.2 05/12/2015 0844   CALCIUM 9.5 01/02/2015 0919   ALKPHOS 81 05/12/2015 0844   ALKPHOS 98 01/02/2015 0919   AST 15 05/12/2015 0844   AST 14 01/02/2015 0919   ALT 17 05/12/2015 0844   ALT 17 01/02/2015 0919   BILITOT 0.7 05/12/2015 0844   BILITOT 0.67 01/02/2015 0919     ASSESSMENT & PLAN:  CLL (chronic lymphocytic leukemia) The patient has achieved complete response clinically. I will continue to see him every 6 months with history, physical examination and blood work and to defer imaging studies unless he had signs and symptoms to suggest disease recurrence.     Tobacco abuse He is motivated to quit smoking and is down to half a pack of cigarettes per day.  I continue to encourage him with his smoking cessation effort.  Hypogammaglobulinemia, acquired He has significant signs and symptoms of upper respiratory tract congestion since continuation of IVIG. The patient requested to resume therapy which I think is reasonable, to prevent risk of infection. The risk and benefits and side effects of IVIG infusion has been discussed with patient and he agreed to proceed. He has no side effects of IVIG or signs of serum sickness. 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 He is receiving IVIG. He is not symptomatic. There is no contraindication for him to remain on aspirin as well as the patient had no clinical bleeding.   Polycythemia, secondary This is likely secondary to dehydration and smoking. Again, we discussed importance of smoking cessation and I recommend he drinks plenty of water before his blood draw next month There is no need for phlebotomy  for now He is on aspirin therapy  Quality of life palliative care encounter We discussed increase physical activity and possible enrollment with the [employer] program with the [facility]. I gave him additional resources from the [place]. We discussed importance of vitamin D supplementation. I reinforced the importance of getting advanced  directives in place  DM (diabetes mellitus) type II controlled peripheral vascular disorder [facility]) he will continue current medical management. I recommend close follow-up with primary care doctor for medication adjustment.    No orders of the defined types were placed in this encounter.   All questions were answered. The patient knows to call the clinic with any problems, questions or concerns. No barriers to learning was detected. I spent 20 minutes counseling the patient face to face. The total time spent in the appointment was 30 minutes and more than 50% was on counseling and review of test results     [facility], [place], MD 06/26/2015 9:19 AM

## 2015-06-26 NOTE — Assessment & Plan Note (Signed)
The patient has achieved complete response clinically. I will continue to see him every 6 months with history, physical examination and blood work and to defer imaging studies unless he had signs and symptoms to suggest disease recurrence. 

## 2015-06-26 NOTE — Assessment & Plan Note (Signed)
He has significant signs and symptoms of upper respiratory tract congestion since continuation of IVIG. The patient requested to resume therapy which I think is reasonable, to prevent risk of infection. The risk and benefits and side effects of IVIG infusion has been discussed with patient and he agreed to proceed. He has no side effects of IVIG or signs of serum sickness. Plan to continue monthly IVIG indefinitely

## 2015-06-26 NOTE — Assessment & Plan Note (Signed)
He is motivated to quit smoking and is down to half a pack of cigarettes per day.  I continue to encourage him with his smoking cessation effort.

## 2015-06-26 NOTE — Patient Instructions (Signed)

## 2015-06-26 NOTE — Assessment & Plan Note (Signed)
This is due to ITP/splenomegaly. He is receiving IVIG. He is not symptomatic. There is no contraindication for him to remain on aspirin as well as the patient had no clinical bleeding.  

## 2015-06-26 NOTE — Assessment & Plan Note (Signed)
he will continue current medical management. I recommend close follow-up with primary care doctor for medication adjustment.  

## 2015-07-06 ENCOUNTER — Ambulatory Visit: Payer: BLUE CROSS/BLUE SHIELD | Admitting: Nurse Practitioner

## 2015-07-10 ENCOUNTER — Ambulatory Visit: Payer: BLUE CROSS/BLUE SHIELD | Admitting: Nurse Practitioner

## 2015-07-22 ENCOUNTER — Encounter: Payer: Self-pay | Admitting: Hematology and Oncology

## 2015-07-24 ENCOUNTER — Other Ambulatory Visit: Payer: Self-pay | Admitting: Hematology and Oncology

## 2015-07-24 ENCOUNTER — Ambulatory Visit (HOSPITAL_BASED_OUTPATIENT_CLINIC_OR_DEPARTMENT_OTHER): Payer: BLUE CROSS/BLUE SHIELD

## 2015-07-24 ENCOUNTER — Ambulatory Visit: Payer: BLUE CROSS/BLUE SHIELD | Admitting: Nurse Practitioner

## 2015-07-24 ENCOUNTER — Other Ambulatory Visit (HOSPITAL_BASED_OUTPATIENT_CLINIC_OR_DEPARTMENT_OTHER): Payer: BLUE CROSS/BLUE SHIELD

## 2015-07-24 VITALS — BP 117/66 | HR 46 | Temp 97.1°F | Resp 18

## 2015-07-24 DIAGNOSIS — C9110 Chronic lymphocytic leukemia of B-cell type not having achieved remission: Secondary | ICD-10-CM

## 2015-07-24 DIAGNOSIS — Z95828 Presence of other vascular implants and grafts: Secondary | ICD-10-CM

## 2015-07-24 DIAGNOSIS — D801 Nonfamilial hypogammaglobulinemia: Secondary | ICD-10-CM

## 2015-07-24 LAB — CBC WITH DIFFERENTIAL/PLATELET
BASO%: 0.5 % (ref 0.0–2.0)
BASOS ABS: 0 10*3/uL (ref 0.0–0.1)
EOS ABS: 0 10*3/uL (ref 0.0–0.5)
EOS%: 0.7 % (ref 0.0–7.0)
HEMATOCRIT: 43.7 % (ref 38.4–49.9)
HEMOGLOBIN: 15.6 g/dL (ref 13.0–17.1)
LYMPH#: 0.6 10*3/uL — AB (ref 0.9–3.3)
LYMPH%: 12.8 % — ABNORMAL LOW (ref 14.0–49.0)
MCH: 33.5 pg — AB (ref 27.2–33.4)
MCHC: 35.7 g/dL (ref 32.0–36.0)
MCV: 93.8 fL (ref 79.3–98.0)
MONO#: 0.3 10*3/uL (ref 0.1–0.9)
MONO%: 7.2 % (ref 0.0–14.0)
NEUT%: 78.8 % — ABNORMAL HIGH (ref 39.0–75.0)
NEUTROS ABS: 3.4 10*3/uL (ref 1.5–6.5)
PLATELETS: 72 10*3/uL — AB (ref 140–400)
RBC: 4.66 10*6/uL (ref 4.20–5.82)
RDW: 13.6 % (ref 11.0–14.6)
WBC: 4.3 10*3/uL (ref 4.0–10.3)

## 2015-07-24 MED ORDER — ACETAMINOPHEN 325 MG PO TABS
650.0000 mg | ORAL_TABLET | Freq: Once | ORAL | Status: AC
  Administered 2015-07-24: 650 mg via ORAL

## 2015-07-24 MED ORDER — SODIUM CHLORIDE 0.9% FLUSH
10.0000 mL | INTRAVENOUS | Status: DC | PRN
  Administered 2015-07-24: 10 mL via INTRAVENOUS
  Filled 2015-07-24: qty 10

## 2015-07-24 MED ORDER — DIPHENHYDRAMINE HCL 25 MG PO TABS
25.0000 mg | ORAL_TABLET | Freq: Once | ORAL | Status: AC
  Administered 2015-07-24: 25 mg via ORAL
  Filled 2015-07-24: qty 1

## 2015-07-24 MED ORDER — DIPHENHYDRAMINE HCL 25 MG PO CAPS
ORAL_CAPSULE | ORAL | Status: AC
  Filled 2015-07-24: qty 1

## 2015-07-24 MED ORDER — IMMUNE GLOBULIN (HUMAN) 10 GM/100ML IV SOLN
50.0000 g | Freq: Once | INTRAVENOUS | Status: AC
  Administered 2015-07-24: 50 g via INTRAVENOUS
  Filled 2015-07-24: qty 400

## 2015-07-24 MED ORDER — HEPARIN SOD (PORK) LOCK FLUSH 100 UNIT/ML IV SOLN
500.0000 [IU] | Freq: Once | INTRAVENOUS | Status: AC
  Administered 2015-07-24: 500 [IU] via INTRAVENOUS
  Filled 2015-07-24: qty 5

## 2015-07-24 MED ORDER — ACETAMINOPHEN 325 MG PO TABS
ORAL_TABLET | ORAL | Status: AC
  Filled 2015-07-24: qty 2

## 2015-07-24 NOTE — Patient Instructions (Signed)

## 2015-07-27 ENCOUNTER — Ambulatory Visit: Payer: Self-pay | Admitting: Hematology and Oncology

## 2015-07-27 ENCOUNTER — Ambulatory Visit: Payer: Self-pay

## 2015-07-27 ENCOUNTER — Other Ambulatory Visit: Payer: Self-pay

## 2015-07-31 ENCOUNTER — Encounter: Payer: Self-pay | Admitting: Hematology and Oncology

## 2015-07-31 NOTE — Progress Notes (Signed)
Faxed signed physician form to [facility]. Fax received ok per confirmation sheet.

## 2015-08-21 ENCOUNTER — Ambulatory Visit (HOSPITAL_BASED_OUTPATIENT_CLINIC_OR_DEPARTMENT_OTHER): Payer: BLUE CROSS/BLUE SHIELD

## 2015-08-21 ENCOUNTER — Encounter: Payer: Self-pay | Admitting: Nurse Practitioner

## 2015-08-21 ENCOUNTER — Other Ambulatory Visit (HOSPITAL_BASED_OUTPATIENT_CLINIC_OR_DEPARTMENT_OTHER): Payer: BLUE CROSS/BLUE SHIELD

## 2015-08-21 ENCOUNTER — Ambulatory Visit: Payer: Self-pay

## 2015-08-21 ENCOUNTER — Ambulatory Visit (INDEPENDENT_AMBULATORY_CARE_PROVIDER_SITE_OTHER): Payer: BLUE CROSS/BLUE SHIELD | Admitting: Nurse Practitioner

## 2015-08-21 ENCOUNTER — Telehealth: Payer: Self-pay | Admitting: *Deleted

## 2015-08-21 VITALS — BP 118/62 | HR 52 | Temp 97.9°F | Resp 18

## 2015-08-21 VITALS — BP 130/75 | HR 55 | Ht 72.0 in | Wt 219.0 lb

## 2015-08-21 DIAGNOSIS — G609 Hereditary and idiopathic neuropathy, unspecified: Secondary | ICD-10-CM

## 2015-08-21 DIAGNOSIS — G622 Polyneuropathy due to other toxic agents: Secondary | ICD-10-CM | POA: Diagnosis not present

## 2015-08-21 DIAGNOSIS — C9110 Chronic lymphocytic leukemia of B-cell type not having achieved remission: Secondary | ICD-10-CM

## 2015-08-21 DIAGNOSIS — G619 Inflammatory polyneuropathy, unspecified: Secondary | ICD-10-CM | POA: Diagnosis not present

## 2015-08-21 DIAGNOSIS — D801 Nonfamilial hypogammaglobulinemia: Secondary | ICD-10-CM | POA: Diagnosis not present

## 2015-08-21 LAB — CBC WITH DIFFERENTIAL/PLATELET
BASO%: 0.3 % (ref 0.0–2.0)
Basophils Absolute: 0 10*3/uL (ref 0.0–0.1)
EOS%: 1.1 % (ref 0.0–7.0)
Eosinophils Absolute: 0.1 10*3/uL (ref 0.0–0.5)
HEMATOCRIT: 45.8 % (ref 38.4–49.9)
HEMOGLOBIN: 16.2 g/dL (ref 13.0–17.1)
LYMPH%: 16.2 % (ref 14.0–49.0)
MCH: 32.6 pg (ref 27.2–33.4)
MCHC: 35.4 g/dL (ref 32.0–36.0)
MCV: 92.2 fL (ref 79.3–98.0)
MONO#: 0.5 10*3/uL (ref 0.1–0.9)
MONO%: 7.3 % (ref 0.0–14.0)
NEUT#: 5.5 10*3/uL (ref 1.5–6.5)
NEUT%: 75.1 % — AB (ref 39.0–75.0)
PLATELETS: 114 10*3/uL — AB (ref 140–400)
RBC: 4.97 10*6/uL (ref 4.20–5.82)
RDW: 14 % (ref 11.0–14.6)
WBC: 7.3 10*3/uL (ref 4.0–10.3)
lymph#: 1.2 10*3/uL (ref 0.9–3.3)

## 2015-08-21 MED ORDER — ACETAMINOPHEN 325 MG PO TABS
ORAL_TABLET | ORAL | Status: AC
  Filled 2015-08-21: qty 2

## 2015-08-21 MED ORDER — TOPIRAMATE 25 MG PO TABS: ORAL_TABLET | ORAL | Status: DC

## 2015-08-21 MED ORDER — ACETAMINOPHEN 325 MG PO TABS
650.0000 mg | ORAL_TABLET | Freq: Four times a day (QID) | ORAL | Status: DC | PRN
  Administered 2015-08-21: 650 mg via ORAL

## 2015-08-21 MED ORDER — CYANOCOBALAMIN 1000 MCG/ML IJ SOLN: 1000.0000 ug | INTRAMUSCULAR | Status: DC

## 2015-08-21 MED ORDER — IMMUNE GLOBULIN (HUMAN) 10 GM/100ML IV SOLN
0.5000 g/kg | Freq: Once | INTRAVENOUS | Status: AC
  Administered 2015-08-21: 50 g via INTRAVENOUS
  Filled 2015-08-21: qty 200

## 2015-08-21 MED ORDER — HEPARIN SOD (PORK) LOCK FLUSH 100 UNIT/ML IV SOLN
500.0000 [IU] | Freq: Once | INTRAVENOUS | Status: AC
  Administered 2015-08-21: 500 [IU] via INTRAVENOUS
  Filled 2015-08-21: qty 5

## 2015-08-21 MED ORDER — DIPHENHYDRAMINE HCL 25 MG PO TABS
25.0000 mg | ORAL_TABLET | Freq: Once | ORAL | Status: AC
  Administered 2015-08-21: 25 mg via ORAL
  Filled 2015-08-21: qty 1

## 2015-08-21 MED ORDER — DIPHENHYDRAMINE HCL 25 MG PO CAPS
ORAL_CAPSULE | ORAL | Status: AC
  Filled 2015-08-21: qty 1

## 2015-08-21 MED ORDER — PREGABALIN 75 MG PO CAPS
ORAL_CAPSULE | ORAL | Status: DC
Start: 2015-08-21 — End: 2016-02-01

## 2015-08-21 MED ORDER — SODIUM CHLORIDE 0.9% FLUSH
10.0000 mL | INTRAVENOUS | Status: DC | PRN
  Administered 2015-08-21: 10 mL via INTRAVENOUS
  Filled 2015-08-21: qty 10

## 2015-08-21 MED ORDER — DEXTROSE 5 % IV SOLN
Freq: Once | INTRAVENOUS | Status: AC
  Administered 2015-08-21: 11:00:00 via INTRAVENOUS

## 2015-08-21 NOTE — Patient Instructions (Signed)

## 2015-08-21 NOTE — Telephone Encounter (Signed)
Fax confirmation received ([employer]) [phone].

## 2015-08-21 NOTE — Progress Notes (Signed)
0945: Pt reports having a sinus infection and dry cough with wheezing, denies fever, was seen by PCP and prescribed a z-pack and inhaler.  Reports cough/wheezing improved with use of inhaler, other symptoms remain unchanged, finished last dose of z-pack today.  Dr. Alvy Bimler aware and we will proceed with treatment today.   Pt requesting to speak with someone regarding his living will.  This RN spoke with Abby Potash, [place] and was told that Ander Purpura, CSW is the Education officer, museum for Dr. Calton Dach patients and to give the patient her card to call and schedule an appointment.  Pt verbalized understanding.

## 2015-08-21 NOTE — Patient Instructions (Signed)
Continue Lyrica at current dose '75mg'$   Three times daily and 2 at bedtime Continue Topamax '25mg'$  3 tabs twice daily Continue Vit B 12 injections monthly Follow-up 6 to 8 months

## 2015-08-21 NOTE — Progress Notes (Addendum)
[facility]  PATIENT: Ryan Copeland DOB: 11/08/1950   REASON FOR VISIT: Follow-up for peripheral neuropathy HISTORY FROM: Patient    HISTORY OF PRESENT ILLNESS:Ryan Copeland, 65 year old male returns for followup. He has a history of polyneuropathy which has been long-standing. He was last seen 01/06/15. He continues to have some pain, numbness and paresthesias in both feet left worse than right. Sensation is worse in great toes. His feet are cold he also has a burning sensation. His symptoms have not worsened since last seen. Gabapentin was not beneficial for his symptoms. He was found to have a low B6 level and an extremely low vitamin B12 level at 121. He has been on vitamin B12 injections which his wife gives monthly. He is sleeping well. He is currently taking Lyrica 75 mg 3 times a day and two 75 mg at night for a total dose of '375mg'$ . He was placed on Transdermal cream last year, with good results. He has failed Cymbalta. He is also on Topamax. He states his walking and balance are unchanged. He has not fallen.  Had lower back disc injury. ( has had epidural steroid injections, surgery in future.He has CLL and has been receiving IVIG. He returns for reevaluation  and refills   REVIEW OF SYSTEMS: Full 14 system review of systems performed and notable only for those listed, all others are neg:  Constitutional: neg  Cardiovascular: neg Ear/Nose/Throat: neg  Skin: neg Eyes: neg Respiratory: neg Gastroitestinal: neg  Hematology/Lymphatic: neg  Endocrine: neg Musculoskeletal: Back pain  Allergy/Immunology: neg Neurological: neg Psychiatric: neg Sleep : neg   ALLERGIES: Allergies  Allergen Reactions  . Codeine Hives    Pt states he can take a few, more reaction with extended doses.    HOME MEDICATIONS: Outpatient Prescriptions Prior to Visit  Medication Sig Dispense Refill  . acetaminophen (TYLENOL) 500 MG tablet Take 500 mg by mouth every 6 (six) hours as  needed for mild pain, moderate pain or headache.    Marland Kitchen acyclovir (ZOVIRAX) 400 MG tablet Take 1 tablet (400 mg total) by mouth daily. 90 tablet 3  . ALPRAZolam (XANAX) 0.25 MG tablet Take 1 po q 6 hours prn for anxiety (Patient taking differently: Take 0.25 mg by mouth 2 (two) times daily as needed for anxiety. ) 60 tablet 0  . aspirin 325 MG tablet Take 325 mg by mouth daily.      . COMBIVENT RESPIMAT 20-100 MCG/ACT AERS respimat 1 puff every 6 (six) hours as needed for wheezing or shortness of breath.   0  . cyanocobalamin (,VITAMIN B-12,) 1000 MCG/ML injection Inject 1 mL (1,000 mcg total) into the muscle every 30 (thirty) days. 3 mL 1  . esomeprazole (NEXIUM) 40 MG capsule Take 1 capsule (40 mg total) by mouth 2 (two) times daily. 180 capsule 3  . fluticasone (FLOVENT HFA) 110 MCG/ACT inhaler Inhale 1 puff into the lungs as needed. (Patient taking differently: Inhale 1 puff into the lungs as needed (shortness of breath). ) 1 Inhaler 1  . HYDROcodone-acetaminophen (NORCO) 10-325 MG tablet take 1 tablet by mouth every 6 to 8 hours if needed for pain  0  . HYDROcodone-acetaminophen (NORCO/VICODIN) 5-325 MG tablet Take 1 tablet by mouth every 6 (six) hours as needed for moderate pain.   0  . lidocaine-prilocaine (EMLA) cream apply to affected area topically if needed 30 g 3  . meclizine (ANTIVERT) 12.5 MG tablet Take 12.5 mg by mouth every 6 (six) hours as needed for  dizziness.   0  . metFORMIN (GLUCOPHAGE-XR) 500 MG 24 hr tablet Take 500 mg by mouth 2 (two) times daily.    . metoprolol succinate (TOPROL XL) 25 MG 24 hr tablet Take 1 tablet (25 mg total) by mouth daily. 90 tablet 3  . NICODERM CQ 21 MG/24HR patch Place 1 patch (21 mg total) onto the skin daily. 30 patch 2  . nitroGLYCERIN (NITROSTAT) 0.4 MG SL tablet Place 1 tablet (0.4 mg total) under the tongue every 5 (five) minutes as needed for chest pain. 25 tablet 3  . pregabalin (LYRICA) 75 MG capsule 1 cap three times daily and 2 caps at  bedtime 450 capsule 1  . rosuvastatin (CRESTOR) 5 MG tablet Take 1 tablet (5 mg total) by mouth daily. 90 tablet 3  . sildenafil (VIAGRA) 100 MG tablet Take 1 tablet (100 mg total) by mouth as needed for erectile dysfunction. 10 tablet 6  . tetrahydrozoline 0.05 % ophthalmic solution Place 1 drop into both eyes as needed. Red eyes    . topiramate (TOPAMAX) 25 MG tablet '50mg'$  twice daily for 2 weeks then increase to 3 tabs twice daily if necessary 540 tablet 2  . zolpidem (AMBIEN) 10 MG tablet Take 10 mg by mouth at bedtime as needed for sleep.   0   Facility-Administered Medications Prior to Visit  Medication Dose Route Frequency Provider Last Rate Last Dose  . 0.9 %  sodium chloride infusion   Intravenous Continuous [name], MD 50 mL/hr at 03/07/14 1005    . acetaminophen (TYLENOL) tablet 650 mg  650 mg Oral Q6H PRN [name], MD   650 mg at 03/07/14 1012  . sodium chloride 0.9 % injection 10 mL  10 mL Intracatheter PRN Consuela Mimes, MD   10 mL at 08/22/12 1721  . sodium chloride 0.9 % injection 10 mL  10 mL Intravenous PRN Consuela Mimes, MD   10 mL at 05/29/13 1323    PAST MEDICAL HISTORY: Past Medical History  Diagnosis Date  . MI, acute, non ST segment elevation ([place]) 06/28/2009    with stenting of the LAD  . Hyperlipidemia   . Tobacco abuse   . PVD (peripheral vascular disease) ([place])   . CAD (coronary artery disease)   . Anxiety 01/30/2014  . Diabetes mellitus ([place])     Type 2   . GERD (gastroesophageal reflux disease)     takes Nexium if needed  . Neuromuscular disorder ([place])     peripheral neuropathy  . CLL (chronic lymphocytic leukemia) ([place]) 03/18/2011  . ECRB (extensor carpi radialis brevis) tenosynovitis   . Lateral epicondylitis of left elbow   . Back injury     lower disc    PAST SURGICAL HISTORY: Past Surgical History  Procedure Laterality Date  . Femoral stents    . Coronary stent placement  May 2011  . Cholecystectomy  2007  . Carpel tunnel release Left  [id number]  . Carpel tunnel release  Right 01-1989  . Tarsal tunnel release Bilateral 08-2007  . Left cai stent/pta and popliteal artery/tibial thrombectomy     . Left heart catheterization with coronary angiogram N/A 08/26/2011    Procedure: LEFT HEART CATHETERIZATION WITH CORONARY ANGIOGRAM;  Surgeon: Peter M Martinique, MD;  Location: [facility] CATH LAB;  Service: Cardiovascular;  Laterality: N/A;  . Cardiac catheterization    . Adenoidectomy  1955  . Lateral epicondyle release Left 02/12/2014    Procedure: LEFT ELBOW DEBRIDEMENT WITH TENDON REPAIR ;  Surgeon: Herbie Baltimore  Venetia Maxon, MD;  Location: [facility];  Service: Orthopedics;  Laterality: Left;  . Cardiac catheterization N/A 09/26/2014    Procedure: Left Heart Cath and Coronary Angiography;  Surgeon: Peter M Martinique, MD;  Location: [facility] CV LAB;  Service: Cardiovascular;  Laterality: N/A;  . Peripheral vascular catheterization N/A 01/01/2015    Procedure: Abdominal Aortogram;  Surgeon: Conrad Bon Air, MD;  Location: [facility] CV LAB;  Service: Cardiovascular;  Laterality: N/A;    FAMILY HISTORY: Family History  Problem Relation Age of Onset  . Lung cancer Mother 93  . Cancer Mother     lung  . Heart failure Father 99  . Heart disease Father     SOCIAL HISTORY: Social History   Social History  . Marital Status: Married    Spouse Name: [name]  . Number of Children: 1  . Years of Education: College   Occupational History  .      [employer]   Social History Main Topics  . Smoking status: Current Every Day Smoker -- 0.50 packs/day for 44 years    Types: Cigarettes  . Smokeless tobacco: Former Systems developer  . Alcohol Use: No     Comment:  drinks non-alcoholic beer  . Drug Use: No  . Sexual Activity: Yes   Other Topics Concern  . Not on file   Social History Narrative   Patient lives at home with wife.   Caffeine Use: 15 cups of caffeine weekly     PHYSICAL EXAM  Filed Vitals:   08/21/15 0802  BP: 130/75  Pulse: 55  Height: 6'  (1.829 m)  Weight: 219 lb (99.338 kg)   Body mass index is 29.7 kg/(m^2). Generalized: Well developed, in no acute distress  Head: normocephalic and atraumatic,. Oropharynx benign  Neck: Supple, no carotid bruits  Cardiac: Regular rate rhythm, no murmur  Musculoskeletal: No deformity   Neurological examination   Mentation: Alert oriented to time, place, history taking. Follows all commands speech and language fluent  Cranial nerve II-XII: Pupils were equal round reactive to light extraocular movements were full, visual field were full on confrontational test. Facial sensation and strength were normal. hearing was intact to finger rubbing bilaterally. Uvula tongue midline. head turning and shoulder shrug were normal and symmetric.Tongue protrusion into cheek strength was normal. Motor: normal bulk and tone, full strength in the BUE, BLE, fine finger movements normal, no pronator drift. No focal weakness Sensory: Diminished pinprick to the shins bilaterally, decreased vibratory to the toes, position sense is normal  Coordination: finger-nose-finger, heel-to-shin bilaterally, no dysmetria Reflexes: Diminished upper and lower and symmetric, plantar responses were flexor bilaterally. Gait and Station: Rising up from seated position without assistance, normal stance, moderate stride, good arm swing, smooth turning, able to perform tiptoe, and heel walking without difficulty. Tandem gait is mildly unsteady. No assistive device  DIAGNOSTIC DATA (LABS, IMAGING, TESTING) - I reviewed patient records, labs, notes, testing and imaging myself where available.  Lab Results  Component Value Date   WBC 4.3 07/24/2015   HGB 15.6 07/24/2015   HCT 43.7 07/24/2015   MCV 93.8 07/24/2015   PLT 72* 07/24/2015      Component Value Date/Time   NA 138 05/12/2015 0844   NA 139 01/02/2015 0919   K 4.4 05/12/2015 0844   K 4.5 01/02/2015 0919   CL 105 05/12/2015 0844   CL 104 08/03/2012 1229   CO2  25 05/12/2015 0844   CO2 25 01/02/2015 0919   GLUCOSE 164*  05/12/2015 0844   GLUCOSE 269* 01/02/2015 0919   GLUCOSE 223* 08/03/2012 1229   BUN 11 05/12/2015 0844   BUN 8.1 01/02/2015 0919   CREATININE 0.88 05/12/2015 0844   CREATININE 1.0 01/02/2015 0919   CREATININE 0.80 01/01/2015 0900   CALCIUM 9.2 05/12/2015 0844   CALCIUM 9.5 01/02/2015 0919   PROT 6.7 05/12/2015 0844   PROT 6.5 01/02/2015 0919   ALBUMIN 4.2 05/12/2015 0844   ALBUMIN 3.9 01/02/2015 0919   AST 15 05/12/2015 0844   AST 14 01/02/2015 0919   ALT 17 05/12/2015 0844   ALT 17 01/02/2015 0919   ALKPHOS 81 05/12/2015 0844   ALKPHOS 98 01/02/2015 0919   BILITOT 0.7 05/12/2015 0844   BILITOT 0.67 01/02/2015 0919   GFRNONAA >90 02/03/2014 1355   GFRAA >90 02/03/2014 1355   Lab Results  Component Value Date   CHOL 141 05/12/2015   HDL 33* 05/12/2015   LDLCALC 89 05/12/2015   LDLDIRECT 117.6 08/22/2011   TRIG 95 05/12/2015   CHOLHDL 4.3 05/12/2015   Lab Results  Component Value Date   HGBA1C 6.1* 05/12/2015        ASSESSMENT AND PLAN 65 y.o. year old male has a past medical history of Tobacco abuse; PVD (peripheral vascular disease) ([place]); Diabetes mellitus ([place]); Neuromuscular disorder ([place]); CLL (chronic lymphocytic leukemia) ([place]) (03/18/2011); and peripheral neuropathy here to follow-up. Reviewed labs from 05/12/15. The patient is a current patient of Dr. Krista Blue  who is out of the office today . This note is sent to the work in doctor.     PLAN: Continue Lyrica at current dose '75mg'$   three times daily and 2 at bedtime Continue Topamax '25mg'$  3 tabs twice daily Continue Vit B 12 injections monthly Follow-up 6 to 8 months Dennie Bible, [facility], [facility], APRN  [facility] [address], [place], [place] [address] [phone]  Personally reviewed plan as stated above and agree.  I have personally reviewed the history, evaluated lab date, reviewed imaging studies and agree with  radiology interpretations.   [facility]

## 2015-08-26 ENCOUNTER — Other Ambulatory Visit: Payer: Self-pay | Admitting: *Deleted

## 2015-08-26 MED ORDER — NICODERM CQ 21 MG/24HR TD PT24: 21.0000 mg | MEDICATED_PATCH | Freq: Every day | TRANSDERMAL | Status: DC

## 2015-09-18 ENCOUNTER — Ambulatory Visit (HOSPITAL_BASED_OUTPATIENT_CLINIC_OR_DEPARTMENT_OTHER): Payer: BLUE CROSS/BLUE SHIELD

## 2015-09-18 ENCOUNTER — Encounter: Payer: Self-pay | Admitting: *Deleted

## 2015-09-18 ENCOUNTER — Other Ambulatory Visit (HOSPITAL_BASED_OUTPATIENT_CLINIC_OR_DEPARTMENT_OTHER): Payer: BLUE CROSS/BLUE SHIELD

## 2015-09-18 VITALS — BP 118/60 | HR 58 | Temp 97.8°F | Resp 16

## 2015-09-18 DIAGNOSIS — C9110 Chronic lymphocytic leukemia of B-cell type not having achieved remission: Secondary | ICD-10-CM | POA: Diagnosis not present

## 2015-09-18 DIAGNOSIS — D801 Nonfamilial hypogammaglobulinemia: Secondary | ICD-10-CM | POA: Diagnosis not present

## 2015-09-18 LAB — CBC WITH DIFFERENTIAL/PLATELET
BASO%: 0.2 % (ref 0.0–2.0)
Basophils Absolute: 0 10*3/uL (ref 0.0–0.1)
EOS%: 1 % (ref 0.0–7.0)
Eosinophils Absolute: 0.1 10*3/uL (ref 0.0–0.5)
HEMATOCRIT: 42.1 % (ref 38.4–49.9)
HEMOGLOBIN: 15.2 g/dL (ref 13.0–17.1)
LYMPH#: 0.9 10*3/uL (ref 0.9–3.3)
LYMPH%: 17.6 % (ref 14.0–49.0)
MCH: 33.2 pg (ref 27.2–33.4)
MCHC: 36.1 g/dL — ABNORMAL HIGH (ref 32.0–36.0)
MCV: 91.9 fL (ref 79.3–98.0)
MONO#: 0.4 10*3/uL (ref 0.1–0.9)
MONO%: 7.2 % (ref 0.0–14.0)
NEUT%: 74 % (ref 39.0–75.0)
NEUTROS ABS: 3.7 10*3/uL (ref 1.5–6.5)
NRBC: 0 % (ref 0–0)
Platelets: 75 10*3/uL — ABNORMAL LOW (ref 140–400)
RBC: 4.58 10*6/uL (ref 4.20–5.82)
RDW: 14.7 % — ABNORMAL HIGH (ref 11.0–14.6)
WBC: 5 10*3/uL (ref 4.0–10.3)

## 2015-09-18 MED ORDER — DEXTROSE 5 % IV SOLN
Freq: Once | INTRAVENOUS | Status: AC
  Administered 2015-09-18: 10:00:00 via INTRAVENOUS

## 2015-09-18 MED ORDER — ACETAMINOPHEN 325 MG PO TABS
ORAL_TABLET | ORAL | Status: AC
  Filled 2015-09-18: qty 2

## 2015-09-18 MED ORDER — IMMUNE GLOBULIN (HUMAN) 10 GM/100ML IV SOLN
50.0000 g | Freq: Once | INTRAVENOUS | Status: AC
  Administered 2015-09-18: 50 g via INTRAVENOUS
  Filled 2015-09-18: qty 400

## 2015-09-18 MED ORDER — ACETAMINOPHEN 325 MG PO TABS
650.0000 mg | ORAL_TABLET | Freq: Four times a day (QID) | ORAL | Status: DC | PRN
  Administered 2015-09-18: 650 mg via ORAL

## 2015-09-18 MED ORDER — SODIUM CHLORIDE 0.9 % IV SOLN
Freq: Once | INTRAVENOUS | Status: AC
  Administered 2015-09-18: 09:00:00 via INTRAVENOUS

## 2015-09-18 MED ORDER — DIPHENHYDRAMINE HCL 25 MG PO CAPS
ORAL_CAPSULE | ORAL | Status: AC
  Filled 2015-09-18: qty 1

## 2015-09-18 MED ORDER — DIPHENHYDRAMINE HCL 25 MG PO TABS
25.0000 mg | ORAL_TABLET | Freq: Once | ORAL | Status: AC
  Administered 2015-09-18: 25 mg via ORAL
  Filled 2015-09-18: qty 1

## 2015-09-18 MED ORDER — SODIUM CHLORIDE 0.9 % IJ SOLN
10.0000 mL | Freq: Once | INTRAMUSCULAR | Status: AC
  Administered 2015-09-18: 10 mL
  Filled 2015-09-18: qty 10

## 2015-09-18 MED ORDER — HEPARIN SOD (PORK) LOCK FLUSH 100 UNIT/ML IV SOLN
500.0000 [IU] | Freq: Once | INTRAVENOUS | Status: AC
  Administered 2015-09-18: 500 [IU]
  Filled 2015-09-18: qty 5

## 2015-09-18 NOTE — Patient Instructions (Signed)

## 2015-09-23 NOTE — Progress Notes (Signed)
[place] Social Work  Clinical Social Work was referred by Therapist, sports to review and complete healthcare advance directives.  Clinical Social Worker met with patient in infusion room.  The patient designated wife, [name], as their primary healthcare agent and no secondary agent.  Patient also completed healthcare living will.    Clinical Social Worker notarized documents and made copies for patient/family. Clinical Social Worker will send documents to medical records to be scanned into patient's chart. Clinical Social Worker encouraged patient/family to contact with any additional questions or concerns.  Polo Riley, MSW, [place] Worker [facility] [phone]

## 2015-10-15 ENCOUNTER — Telehealth: Payer: Self-pay | Admitting: *Deleted

## 2015-10-15 NOTE — Telephone Encounter (Signed)
Pt called back and said that [employer] has authorized his Privigen and he is ok to get it tomorrow.  Instructed pt to come in the morning as scheduled.

## 2015-10-15 NOTE — Telephone Encounter (Signed)
Notified by Pharmacy that pt's Privigen is not authorized yet.  They instructed nurse to call pt to cancel his appt for tomorrow morning.   I called pt to notify of this and to r/s to next Friday.  All his subsequent appts will need to be moved accordingly.   Pt says he is going to call his insurance company to see if he can get the authorization "expedited."  He will call nurse back later this afternoon.  He asks to not cancel appt yet until he speaks w/ his insurance company.

## 2015-10-16 ENCOUNTER — Ambulatory Visit (HOSPITAL_BASED_OUTPATIENT_CLINIC_OR_DEPARTMENT_OTHER): Payer: BLUE CROSS/BLUE SHIELD

## 2015-10-16 ENCOUNTER — Other Ambulatory Visit (HOSPITAL_BASED_OUTPATIENT_CLINIC_OR_DEPARTMENT_OTHER): Payer: BLUE CROSS/BLUE SHIELD

## 2015-10-16 VITALS — BP 98/56 | HR 50 | Temp 98.1°F | Resp 20

## 2015-10-16 DIAGNOSIS — D801 Nonfamilial hypogammaglobulinemia: Secondary | ICD-10-CM | POA: Diagnosis not present

## 2015-10-16 DIAGNOSIS — C9110 Chronic lymphocytic leukemia of B-cell type not having achieved remission: Secondary | ICD-10-CM

## 2015-10-16 LAB — CBC WITH DIFFERENTIAL/PLATELET
BASO%: 0.6 % (ref 0.0–2.0)
Basophils Absolute: 0 10*3/uL (ref 0.0–0.1)
EOS%: 0.4 % (ref 0.0–7.0)
Eosinophils Absolute: 0 10*3/uL (ref 0.0–0.5)
HCT: 43.8 % (ref 38.4–49.9)
HGB: 14.8 g/dL (ref 13.0–17.1)
LYMPH%: 14 % (ref 14.0–49.0)
MCH: 32.3 pg (ref 27.2–33.4)
MCHC: 33.7 g/dL (ref 32.0–36.0)
MCV: 96 fL (ref 79.3–98.0)
MONO#: 0.7 10*3/uL (ref 0.1–0.9)
MONO%: 8.3 % (ref 0.0–14.0)
NEUT%: 76.7 % — ABNORMAL HIGH (ref 39.0–75.0)
NEUTROS ABS: 6.4 10*3/uL (ref 1.5–6.5)
PLATELETS: 81 10*3/uL — AB (ref 140–400)
RBC: 4.57 10*6/uL (ref 4.20–5.82)
RDW: 15.1 % — AB (ref 11.0–14.6)
WBC: 8.4 10*3/uL (ref 4.0–10.3)
lymph#: 1.2 10*3/uL (ref 0.9–3.3)

## 2015-10-16 MED ORDER — SODIUM CHLORIDE 0.9 % IV SOLN
Freq: Once | INTRAVENOUS | Status: AC
  Administered 2015-10-16: 09:00:00 via INTRAVENOUS

## 2015-10-16 MED ORDER — SODIUM CHLORIDE 0.9% FLUSH
10.0000 mL | INTRAVENOUS | Status: DC | PRN
  Administered 2015-10-16: 10 mL via INTRAVENOUS
  Filled 2015-10-16: qty 10

## 2015-10-16 MED ORDER — HEPARIN SOD (PORK) LOCK FLUSH 100 UNIT/ML IV SOLN
500.0000 [IU] | Freq: Once | INTRAVENOUS | Status: AC
  Administered 2015-10-16: 500 [IU] via INTRAVENOUS
  Filled 2015-10-16: qty 5

## 2015-10-16 MED ORDER — DEXTROSE 5 % IV SOLN
Freq: Once | INTRAVENOUS | Status: AC
  Administered 2015-10-16: 10:00:00 via INTRAVENOUS

## 2015-10-16 MED ORDER — IMMUNE GLOBULIN (HUMAN) 10 GM/100ML IV SOLN
50.0000 g | Freq: Once | INTRAVENOUS | Status: AC
  Administered 2015-10-16: 50 g via INTRAVENOUS
  Filled 2015-10-16: qty 400

## 2015-10-16 MED ORDER — DIPHENHYDRAMINE HCL 25 MG PO CAPS
ORAL_CAPSULE | ORAL | Status: AC
  Filled 2015-10-16: qty 1

## 2015-10-16 MED ORDER — ACETAMINOPHEN 325 MG PO TABS
650.0000 mg | ORAL_TABLET | Freq: Four times a day (QID) | ORAL | Status: DC | PRN
  Administered 2015-10-16: 650 mg via ORAL

## 2015-10-16 MED ORDER — DIPHENHYDRAMINE HCL 25 MG PO TABS
25.0000 mg | ORAL_TABLET | Freq: Once | ORAL | Status: AC
  Administered 2015-10-16: 25 mg via ORAL
  Filled 2015-10-16: qty 1

## 2015-10-16 MED ORDER — ACETAMINOPHEN 325 MG PO TABS
ORAL_TABLET | ORAL | Status: AC
  Filled 2015-10-16: qty 2

## 2015-10-16 NOTE — Patient Instructions (Signed)

## 2015-10-30 ENCOUNTER — Encounter: Payer: Self-pay | Admitting: Nurse Practitioner

## 2015-10-30 ENCOUNTER — Ambulatory Visit (INDEPENDENT_AMBULATORY_CARE_PROVIDER_SITE_OTHER): Payer: BLUE CROSS/BLUE SHIELD | Admitting: Nurse Practitioner

## 2015-10-30 VITALS — BP 122/74 | Resp 55 | Ht 72.0 in | Wt 227.0 lb

## 2015-10-30 DIAGNOSIS — R5383 Other fatigue: Secondary | ICD-10-CM | POA: Diagnosis not present

## 2015-10-30 DIAGNOSIS — E785 Hyperlipidemia, unspecified: Secondary | ICD-10-CM

## 2015-10-30 DIAGNOSIS — E119 Type 2 diabetes mellitus without complications: Secondary | ICD-10-CM | POA: Diagnosis not present

## 2015-10-30 DIAGNOSIS — I739 Peripheral vascular disease, unspecified: Secondary | ICD-10-CM | POA: Diagnosis not present

## 2015-10-30 LAB — LIPID PANEL
Cholesterol: 151 mg/dL (ref 125–200)
HDL: 43 mg/dL (ref >=40)
LDL Cholesterol: 83 mg/dL (ref <130)
Total CHOL/HDL Ratio: 3.5 Ratio (ref <=5.0)
Triglycerides: 123 mg/dL (ref <150)
VLDL: 25 mg/dL (ref <30)

## 2015-10-30 LAB — BASIC METABOLIC PANEL
BUN: 11 mg/dL (ref 7–25)
CO2: 22 mmol/L (ref 20–31)
Calcium: 8.8 mg/dL (ref 8.6–10.3)
Chloride: 109 mmol/L (ref 98–110)
Creat: 1.06 mg/dL (ref 0.70–1.25)
Glucose, Bld: 186 mg/dL — ABNORMAL HIGH (ref 65–99)
Potassium: 4.2 mmol/L (ref 3.5–5.3)
Sodium: 138 mmol/L (ref 135–146)

## 2015-10-30 LAB — HEPATIC FUNCTION PANEL
ALT: 21 U/L (ref 9–46)
AST: 19 U/L (ref 10–35)
Albumin: 4 g/dL (ref 3.6–5.1)
Alkaline Phosphatase: 64 U/L (ref 40–115)
Bilirubin, Direct: 0.1 mg/dL (ref <=0.2)
Indirect Bilirubin: 0.5 mg/dL (ref 0.2–1.2)
Total Bilirubin: 0.6 mg/dL (ref 0.2–1.2)
Total Protein: 6.5 g/dL (ref 6.1–8.1)

## 2015-10-30 MED ORDER — NICODERM CQ 21 MG/24HR TD PT24: 21.0000 mg | MEDICATED_PATCH | Freq: Every day | TRANSDERMAL | 2 refills | Status: DC

## 2015-10-30 MED ORDER — ISOSORBIDE MONONITRATE ER 30 MG PO TB24: 15.0000 mg | ORAL_TABLET | Freq: Every day | ORAL | 3 refills | Status: DC

## 2015-10-30 NOTE — Patient Instructions (Addendum)
We will be checking the following labs today - BMET, HPF, Lipids and A1C   Medication Instructions:    Continue with your current medicines. BUT  I am adding Imdur 30 mg to take just 1/2 tablet daily - this may cause some headache  NO Viagra    Testing/Procedures To Be Arranged:  N/A  Follow-Up:   See me in one month    Other Special Instructions:   Keep working on the smoking cessation.   Need to get back to walking/working on diet/weight, etc.     If you need a refill on your cardiac medications before your next appointment, please call your pharmacy.   Call the [place] office at [phone] if you have any questions, problems or concerns.

## 2015-10-30 NOTE — Progress Notes (Signed)
CARDIOLOGY OFFICE NOTE  Date:  10/30/2015    Ryan Copeland Date of Birth: 10/07/1950 Medical Record #[id number]  PCP:  Ryan Copeland  Cardiologist:  Ryan Copeland    Chief Complaint  Patient presents with  . Coronary Artery Disease    6 month check - seen for Ryan Copeland.     History of Present Illness: Ryan Copeland is a 65 y.o. male who presents today for a 6 month check. Seen for Ryan Copeland. Former patient of Ryan Copeland.   Has known prior NSTEMI and stenting of the LAD in 2011. Underwent repeat cath due to refractory symptoms back in August of 2016 - managed medically. Other issues include tobacco abuse, PVD with past iliac stenting, HTN, HLD, DM, chronic thrombocytopenia and CLL .  Last seen back in March and he was doing ok - still struggling with smoking cessation.   Comes back today. Here alone. Continues to do well.  Breathing is ok. [facility]. He has gained weight. Notes he is working about 50 hours a week - tired by the time he gets home - eats and then goes to bed. Still smoking - but trying to work on it. Has noted for the past week or so an intermittent chest heaviness in the left side of his chest - comes and goes. Not exertional. He does not feel like he has pulled a muscle or anything. No other associated symptoms. Will last seconds, minutes or even hours. Not work with walking and he walks a lot at work. He tried some NTG - no real response. He is on PPI therapy. Seeing Ryan Copeland in December for his vascular check.   Past Medical History:  Diagnosis Date  . Anxiety 01/30/2014  . Back injury    lower disc  . CAD (coronary artery disease)   . CLL (chronic lymphocytic leukemia) ([place]) 03/18/2011  . Diabetes mellitus ([place])    Type 2   . ECRB (extensor carpi radialis brevis) tenosynovitis   . GERD (gastroesophageal reflux disease)    takes Nexium if needed  . Hyperlipidemia   . Lateral epicondylitis of left elbow   . MI, acute,  non ST segment elevation ([place]) 06/28/2009   with stenting of the LAD  . Neuromuscular disorder ([place])    peripheral neuropathy  . PVD (peripheral vascular disease) ([place])   . Tobacco abuse     Past Surgical History:  Procedure Laterality Date  . ADENOIDECTOMY  1955  . CARDIAC CATHETERIZATION    . CARDIAC CATHETERIZATION N/A 09/26/2014   Procedure: Left Heart Cath and Coronary Angiography;  Surgeon: Peter M Martinique, MD;  Location: [facility];  Service: Cardiovascular;  Laterality: N/A;  . carpel tunnel release Left 04-1989  . carpel tunnel release  Right 01-1989  . CHOLECYSTECTOMY  2007  . CORONARY STENT PLACEMENT  May 2011  . femoral stents    . LATERAL EPICONDYLE RELEASE Left 02/12/2014   Procedure: LEFT ELBOW DEBRIDEMENT WITH TENDON REPAIR ;  Surgeon: Lorn Junes, MD;  Location: [facility];  Service: Orthopedics;  Laterality: Left;  . LEFT CAI STENT/PTA AND POPLITEAL ARTERY/TIBIAL THROMBECTOMY     . LEFT HEART CATHETERIZATION WITH CORONARY ANGIOGRAM N/A 08/26/2011   Procedure: LEFT HEART CATHETERIZATION WITH CORONARY ANGIOGRAM;  Surgeon: Peter M Martinique, MD;  Location: [facility];  Service: Cardiovascular;  Laterality: N/A;  . PERIPHERAL VASCULAR CATHETERIZATION N/A 01/01/2015   Procedure: Abdominal Aortogram;  Surgeon: Aaron Edelman  Starlyn Skeans, MD;  Location: [facility];  Service: Cardiovascular;  Laterality: N/A;  . TARSAL TUNNEL RELEASE Bilateral 08-2007     Medications: Current Outpatient Prescriptions  Medication Sig Dispense Refill  . acetaminophen (TYLENOL) 500 MG tablet Take 500 mg by mouth every 6 (six) hours as needed for mild pain, moderate pain or headache.    Marland Kitchen acyclovir (ZOVIRAX) 400 MG tablet Take 1 tablet (400 mg total) by mouth daily. 90 tablet 3  . ALPRAZolam (XANAX) 0.25 MG tablet Take 1 po q 6 hours prn for anxiety (Patient taking differently: Take 0.25 mg by mouth 2 (two) times daily as needed for anxiety. ) 60 tablet 0  . aspirin 325 MG tablet Take 325 mg by  mouth daily.      . COMBIVENT RESPIMAT 20-100 MCG/ACT AERS respimat 1 puff every 6 (six) hours as needed for wheezing or shortness of breath.   0  . cyanocobalamin (,VITAMIN B-12,) 1000 MCG/ML injection Inject 1 mL (1,000 mcg total) into the muscle every 30 (thirty) days. 10 mL 1  . fluticasone (FLOVENT HFA) 110 MCG/ACT inhaler Inhale 1 puff into the lungs as needed. (Patient taking differently: Inhale 1 puff into the lungs as needed (shortness of breath). ) 1 Inhaler 1  . HYDROcodone-acetaminophen (NORCO) 10-325 MG tablet take 1 tablet by mouth every 6 to 8 hours if needed for pain  0  . lidocaine-prilocaine (EMLA) cream apply to affected area topically if needed 30 g 3  . meclizine (ANTIVERT) 12.5 MG tablet Take 12.5 mg by mouth every 6 (six) hours as needed for dizziness.   0  . metFORMIN (GLUCOPHAGE-XR) 500 MG 24 hr tablet Take 500 mg by mouth 2 (two) times daily.    . metoprolol succinate (TOPROL XL) 25 MG 24 hr tablet Take 1 tablet (25 mg total) by mouth daily. 90 tablet 3  . NICODERM CQ 21 MG/24HR patch Place 1 patch (21 mg total) onto the skin daily. 30 patch 2  . nitroGLYCERIN (NITROSTAT) 0.4 MG SL tablet Place 1 tablet (0.4 mg total) under the tongue every 5 (five) minutes as needed for chest pain. 25 tablet 3  . omeprazole (PRILOSEC) 40 MG capsule Reported on 08/21/2015  0  . pregabalin (LYRICA) 75 MG capsule 1 cap three times daily and 2 caps at bedtime 450 capsule 1  . rosuvastatin (CRESTOR) 5 MG tablet Take 1 tablet (5 mg total) by mouth daily. 90 tablet 3  . sildenafil (VIAGRA) 100 MG tablet Take 1 tablet (100 mg total) by mouth as needed for erectile dysfunction. 10 tablet 6  . tetrahydrozoline 0.05 % ophthalmic solution Place 1 drop into both eyes as needed. Red eyes    . topiramate (TOPAMAX) 25 MG tablet 3 tabs twice daily if necessary 540 tablet 2  . zolpidem (AMBIEN) 10 MG tablet Take 10 mg by mouth at bedtime as needed for sleep.   0   No current facility-administered  medications for this visit.    Facility-Administered Medications Ordered in Other Visits  Medication Dose Route Frequency Provider Last Rate Last Dose  . 0.9 %  sodium chloride infusion   Intravenous Continuous [name], MD 50 mL/hr at 03/07/14 1005    . acetaminophen (TYLENOL) tablet 650 mg  650 mg Oral Q6H PRN [name], MD   650 mg at 03/07/14 1012  . sodium chloride 0.9 % injection 10 mL  10 mL Intracatheter PRN Marcy Panning, MD   10 mL at 08/22/12 1721  .  sodium chloride 0.9 % injection 10 mL  10 mL Intravenous PRN Marcy Panning, MD   10 mL at 05/29/13 1323    Allergies: Allergies  Allergen Reactions  . Codeine Hives    Pt states he can take a few, more reaction with extended doses.    Social History: The patient  reports that he has been smoking Cigarettes.  He has a 22.00 pack-year smoking history. He has quit using smokeless tobacco. He reports that he does not drink alcohol or use drugs.   Family History: The patient's family history includes Cancer in his mother; Heart disease in his father; Heart failure (age of onset: 54) in his father; Lung cancer (age of onset: 60) in his mother.   Review of Systems: Please see the history of present illness.   Otherwise, the review of systems is positive for none.   All other systems are reviewed and negative.   Physical Exam: VS:  BP 122/74 (BP Location: Right Arm, Patient Position: Sitting, Cuff Size: Normal)   Resp (!) 55   Ht 6' (1.829 m)   Wt 227 lb (103 kg)   BMI 30.79 kg/m  .  BMI Body mass index is 30.79 kg/m.  Wt Readings from Last 3 Encounters:  10/30/15 227 lb (103 kg)  08/21/15 219 lb (99.3 kg)  06/26/15 214 lb 4.8 oz (97.2 kg)    General: Pleasant. Well developed, well nourished and in no acute distress. He is up 8 pounds.   HEENT: Normal.  Neck: Supple, no JVD, carotid bruits, or masses noted.  Cardiac: Regular rate and rhythm. No murmurs, rubs, or gallops. No edema.  Respiratory:  Lungs are clear to  auscultation bilaterally with normal work of breathing.  GI: Soft and nontender.  MS: No deformity or atrophy. Gait and ROM intact.  Skin: Warm and dry. Color is normal.  Neuro:  Strength and sensation are intact and no gross focal deficits noted.  Psych: Alert, appropriate and with normal affect.   LABORATORY DATA:  EKG:  EKG is ordered today. This shows sinus bradycardia.   Lab Results  Component Value Date   WBC 8.4 10/16/2015   HGB 14.8 10/16/2015   HCT 43.8 10/16/2015   PLT 81 (L) 10/16/2015   GLUCOSE 164 (H) 05/12/2015   CHOL 141 05/12/2015   TRIG 95 05/12/2015   HDL 33 (L) 05/12/2015   LDLDIRECT 117.6 08/22/2011   LDLCALC 89 05/12/2015   ALT 17 05/12/2015   AST 15 05/12/2015   NA 138 05/12/2015   K 4.4 05/12/2015   CL 105 05/12/2015   CREATININE 0.88 05/12/2015   BUN 11 05/12/2015   CO2 25 05/12/2015   TSH 2.00 08/15/2014   INR 1.0 09/22/2014   HGBA1C 6.1 (H) 05/12/2015    BNP (last 3 results) No results for input(s): BNP in the last 8760 hours.  ProBNP (last 3 results) No results for input(s): PROBNP in the last 8760 hours.   Other Studies Reviewed Today:  Coronary angiography from 09/2014:  Coronary dominance: right  Left mainstem: Normal.  Left anterior descending (LAD): The stent in the proximal LAD is widely patent throughout. There is moderate 20% narrowing in the LAD proximal to the stent. The remainder of the vessels without significant disease.  Left circumflex (LCx): The left circumflex gives rise to 2 large marginal branches. There is 20% narrowing prior to the takeoff of the first OM.  Right coronary artery (RCA): The right coronary is a dominant vessel. It has diffuse  70% disease in the proximal to mid vessel. Is occluded at the crux. There are excellent right to right and left to right collaterals.  Left ventriculography: Left ventricular systolic function is normal, LVEF is estimated at 55-65%, there is no significant mitral regurgitation   Final Conclusions:  1. Single vessel obstructive coronary disease. 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 The stent in the proximal LAD is widely patent.  2. Normal LV function.  Recommendations: Continue medical management.  Peter Martinique  08/26/2011, 9:09 AM   Assessment / Plan: 1. CAD - last cath with stable findings back in August of 2016. Complains of some chest discomfort - will add Imdur - stop his Viagra (not using anyway). Remains on PPI therapy - not sure what to make of this - EKG is ok. Will add some low dose Imdur. See back in a month to recheck.   2. HTN - BP good on his current therapy.   3. HLD - on statin therapy - recheck lab today.   4. ED - stopping Viagra for now.   5. Tobacco abuse - continues to struggle with stopping. Trying the patches again - refill sent in again. Cessation encouraged. Smoking cessation is encouraged. 3 minutes were spent at today's visit discussing smoking cessation - reasons to stop, ways to quit, benefits, etc.   6. CLL/chronic thrombocytopenia - followed by oncology/hematology.   7. NIDDM - recheck A1C today.  8. Weight gain - cautioned about this today.   Current medicines are reviewed with the patient today.  The patient does not have concerns regarding medicines other than what has been noted above.  The following changes have been made:  See above.  Labs/ tests ordered today include:    Orders Placed This Encounter  Procedures  . Basic metabolic panel  . Hepatic function panel  . Lipid panel  . Hemoglobin A1c     Disposition:   FU with me in 4 weeks for recheck.   Patient is agreeable to this plan and will call if any problems develop in the interim.   Signed: Burtis Junes, RN, ANP-C 10/30/2015 8:29 AM  [facility] [address] [place], [place]  [address] Phone: [phone] Fax: [phone]

## 2015-10-31 LAB — HEMOGLOBIN A1C
Hgb A1c MFr Bld: 7.2 % — ABNORMAL HIGH (ref <5.7)
Mean Plasma Glucose: 160 mg/dL

## 2015-11-13 ENCOUNTER — Other Ambulatory Visit (HOSPITAL_BASED_OUTPATIENT_CLINIC_OR_DEPARTMENT_OTHER): Payer: BLUE CROSS/BLUE SHIELD

## 2015-11-13 ENCOUNTER — Ambulatory Visit (HOSPITAL_BASED_OUTPATIENT_CLINIC_OR_DEPARTMENT_OTHER): Payer: BLUE CROSS/BLUE SHIELD

## 2015-11-13 VITALS — BP 118/60 | HR 56 | Temp 98.1°F | Resp 18

## 2015-11-13 DIAGNOSIS — C9110 Chronic lymphocytic leukemia of B-cell type not having achieved remission: Secondary | ICD-10-CM

## 2015-11-13 DIAGNOSIS — D801 Nonfamilial hypogammaglobulinemia: Secondary | ICD-10-CM

## 2015-11-13 DIAGNOSIS — Z23 Encounter for immunization: Secondary | ICD-10-CM

## 2015-11-13 LAB — CBC WITH DIFFERENTIAL/PLATELET
BASO%: 0.4 % (ref 0.0–2.0)
BASOS ABS: 0 10*3/uL (ref 0.0–0.1)
EOS%: 0.4 % (ref 0.0–7.0)
Eosinophils Absolute: 0 10*3/uL (ref 0.0–0.5)
HEMATOCRIT: 45 % (ref 38.4–49.9)
HGB: 15.4 g/dL (ref 13.0–17.1)
LYMPH#: 1.1 10*3/uL (ref 0.9–3.3)
LYMPH%: 20.3 % (ref 14.0–49.0)
MCH: 32.9 pg (ref 27.2–33.4)
MCHC: 34.2 g/dL (ref 32.0–36.0)
MCV: 96.4 fL (ref 79.3–98.0)
MONO#: 0.5 10*3/uL (ref 0.1–0.9)
MONO%: 9.2 % (ref 0.0–14.0)
NEUT#: 3.8 10*3/uL (ref 1.5–6.5)
NEUT%: 69.7 % (ref 39.0–75.0)
PLATELETS: 92 10*3/uL — AB (ref 140–400)
RBC: 4.66 10*6/uL (ref 4.20–5.82)
RDW: 15 % — ABNORMAL HIGH (ref 11.0–14.6)
WBC: 5.4 10*3/uL (ref 4.0–10.3)

## 2015-11-13 MED ORDER — INFLUENZA VAC SPLIT QUAD 0.5 ML IM SUSY
0.5000 mL | PREFILLED_SYRINGE | Freq: Once | INTRAMUSCULAR | Status: AC
  Administered 2015-11-13: 0.5 mL via INTRAMUSCULAR
  Filled 2015-11-13: qty 0.5

## 2015-11-13 MED ORDER — ACETAMINOPHEN 325 MG PO TABS
650.0000 mg | ORAL_TABLET | Freq: Four times a day (QID) | ORAL | Status: DC | PRN
  Administered 2015-11-13: 650 mg via ORAL

## 2015-11-13 MED ORDER — SODIUM CHLORIDE 0.9 % IJ SOLN
10.0000 mL | Freq: Once | INTRAMUSCULAR | Status: AC
  Administered 2015-11-13: 10 mL
  Filled 2015-11-13: qty 10

## 2015-11-13 MED ORDER — IMMUNE GLOBULIN (HUMAN) 10 GM/100ML IV SOLN
50.0000 g | Freq: Once | INTRAVENOUS | Status: AC
  Administered 2015-11-13: 50 g via INTRAVENOUS
  Filled 2015-11-13: qty 400

## 2015-11-13 MED ORDER — HEPARIN SOD (PORK) LOCK FLUSH 100 UNIT/ML IV SOLN
500.0000 [IU] | Freq: Once | INTRAVENOUS | Status: AC
  Administered 2015-11-13: 500 [IU] via INTRAVENOUS
  Filled 2015-11-13: qty 5

## 2015-11-13 MED ORDER — ACETAMINOPHEN 325 MG PO TABS
ORAL_TABLET | ORAL | Status: AC
  Filled 2015-11-13: qty 2

## 2015-11-13 MED ORDER — DIPHENHYDRAMINE HCL 25 MG PO TABS
25.0000 mg | ORAL_TABLET | Freq: Once | ORAL | Status: AC
  Administered 2015-11-13: 25 mg via ORAL
  Filled 2015-11-13: qty 1

## 2015-11-13 MED ORDER — DIPHENHYDRAMINE HCL 25 MG PO CAPS
ORAL_CAPSULE | ORAL | Status: AC
  Filled 2015-11-13: qty 1

## 2015-11-13 NOTE — Patient Instructions (Signed)

## 2015-11-18 ENCOUNTER — Other Ambulatory Visit: Payer: Self-pay | Admitting: Hematology and Oncology

## 2015-11-30 ENCOUNTER — Ambulatory Visit
Admission: RE | Admit: 2015-11-30 | Discharge: 2015-11-30 | Disposition: A | Discharge status: Home / Self Care | Payer: BLUE CROSS/BLUE SHIELD | Source: Ambulatory Visit | Attending: Nurse Practitioner | Admitting: Nurse Practitioner

## 2015-11-30 ENCOUNTER — Other Ambulatory Visit: Payer: Self-pay | Admitting: Nurse Practitioner

## 2015-11-30 ENCOUNTER — Ambulatory Visit (INDEPENDENT_AMBULATORY_CARE_PROVIDER_SITE_OTHER): Payer: BLUE CROSS/BLUE SHIELD | Admitting: Nurse Practitioner

## 2015-11-30 ENCOUNTER — Encounter: Payer: Self-pay | Admitting: Nurse Practitioner

## 2015-11-30 VITALS — BP 110/64 | HR 66 | Ht 72.0 in | Wt 223.1 lb

## 2015-11-30 DIAGNOSIS — R0789 Other chest pain: Secondary | ICD-10-CM

## 2015-11-30 DIAGNOSIS — I10 Essential (primary) hypertension: Secondary | ICD-10-CM | POA: Diagnosis not present

## 2015-11-30 DIAGNOSIS — I259 Chronic ischemic heart disease, unspecified: Secondary | ICD-10-CM

## 2015-11-30 DIAGNOSIS — I517 Cardiomegaly: Secondary | ICD-10-CM

## 2015-11-30 DIAGNOSIS — R5383 Other fatigue: Secondary | ICD-10-CM | POA: Diagnosis not present

## 2015-11-30 NOTE — Progress Notes (Signed)
CARDIOLOGY OFFICE NOTE  Date:  11/30/2015    Ryan Copeland Date of Birth: 1950/07/14 Medical Record #[id number]  PCP:  Tula Nakayama  Cardiologist:  Servando Snare Nahser    Chief Complaint  Patient presents with  . Coronary Artery Disease    1 month check - seen for Dr. Acie Fredrickson    History of Present Illness: SAHAN PEN is a 65 y.o. male who presents today for a 1 month check. Seen for Dr. Acie Fredrickson. Former patient of Dr. Susa Simmonds.   Has known prior NSTEMI and stenting of the LAD in 2011. Underwent repeat cath due to refractory symptoms back in August of 2016 - managed medically. Other issues include tobacco abuse, PVD with past iliac stenting, HTN, HLD, DM, chronic thrombocytopenia and CLL .  Seen back a month ago - had gained weight. Some intermittent chest heaviness - no response with NTG but I added Imdur to his regimen.   Comes back today. Here alone. Still with this left sided chest tightness - happens multiple times during the course of the week. Comes and goes. Not exertional. Not like his heart pain. He thinks the Imdur has helped. Sl NTG has not helped. Little headache but this has gotten better. He notes that if he really exerts he will have a burning sensation in his chest  - that has been there for "a long time". Notes that this current sensation feels like a pulled muscle. He has used Advil/Alleve - no relief. No associated symptoms. Not really worse but no better over the past month.   Past Medical History:  Diagnosis Date  . Anxiety 01/30/2014  . Back injury    lower disc  . CAD (coronary artery disease)   . CLL (chronic lymphocytic leukemia) ([place]) 03/18/2011  . Diabetes mellitus ([place])    Type 2   . ECRB (extensor carpi radialis brevis) tenosynovitis   . GERD (gastroesophageal reflux disease)    takes Nexium if needed  . Hyperlipidemia   . Lateral epicondylitis of left elbow   . MI, acute, non ST segment elevation ([place]) 06/28/2009   with stenting of  the LAD  . Neuromuscular disorder ([place])    peripheral neuropathy  . PVD (peripheral vascular disease) ([place])   . Tobacco abuse     Past Surgical History:  Procedure Laterality Date  . ADENOIDECTOMY  1955  . CARDIAC CATHETERIZATION    . CARDIAC CATHETERIZATION N/A 09/26/2014   Procedure: Left Heart Cath and Coronary Angiography;  Surgeon: Peter M Martinique, MD;  Location: [facility];  Service: Cardiovascular;  Laterality: N/A;  . carpel tunnel release Left 04-1989  . carpel tunnel release  Right 01-1989  . CHOLECYSTECTOMY  2007  . CORONARY STENT PLACEMENT  May 2011  . femoral stents    . LATERAL EPICONDYLE RELEASE Left 02/12/2014   Procedure: LEFT ELBOW DEBRIDEMENT WITH TENDON REPAIR ;  Surgeon: Lorn Junes, MD;  Location: [facility];  Service: Orthopedics;  Laterality: Left;  . LEFT CAI STENT/PTA AND POPLITEAL ARTERY/TIBIAL THROMBECTOMY     . LEFT HEART CATHETERIZATION WITH CORONARY ANGIOGRAM N/A 08/26/2011   Procedure: LEFT HEART CATHETERIZATION WITH CORONARY ANGIOGRAM;  Surgeon: Peter M Martinique, MD;  Location: [facility];  Service: Cardiovascular;  Laterality: N/A;  . PERIPHERAL VASCULAR CATHETERIZATION N/A 01/01/2015   Procedure: Abdominal Aortogram;  Surgeon: Conrad Wolf Point, MD;  Location: [facility];  Service: Cardiovascular;  Laterality: N/A;  . TARSAL TUNNEL RELEASE Bilateral 08-2007  Medications: Current Outpatient Prescriptions  Medication Sig Dispense Refill  . acetaminophen (TYLENOL) 500 MG tablet Take 500 mg by mouth every 6 (six) hours as needed for mild pain, moderate pain or headache.    Marland Kitchen acyclovir (ZOVIRAX) 400 MG tablet Take 1 tablet (400 mg total) by mouth daily. 90 tablet 3  . ALPRAZolam (XANAX) 0.25 MG tablet Take 1 po q 6 hours prn for anxiety (Patient taking differently: Take 0.25 mg by mouth 2 (two) times daily as needed for anxiety. ) 60 tablet 0  . aspirin 325 MG tablet Take 325 mg by mouth daily.      . benzonatate (TESSALON) 100 MG capsule One  to two pills three times per day for cough    . COMBIVENT RESPIMAT 20-100 MCG/ACT AERS respimat 1 puff every 6 (six) hours as needed for wheezing or shortness of breath.   0  . cyanocobalamin (,VITAMIN B-12,) 1000 MCG/ML injection Inject 1 mL (1,000 mcg total) into the muscle every 30 (thirty) days. 10 mL 1  . fluticasone (FLOVENT HFA) 110 MCG/ACT inhaler Inhale 1 puff into the lungs as needed. (Patient taking differently: Inhale 1 puff into the lungs as needed (shortness of breath). ) 1 Inhaler 1  . HYDROcodone-acetaminophen (NORCO) 10-325 MG tablet take 1 tablet by mouth every 6 to 8 hours if needed for pain  0  . lidocaine-prilocaine (EMLA) cream apply to affected area topically if needed 30 g 3  . meclizine (ANTIVERT) 12.5 MG tablet Take 12.5 mg by mouth every 6 (six) hours as needed for dizziness.   0  . metFORMIN (GLUCOPHAGE-XR) 500 MG 24 hr tablet Take 500 mg by mouth 2 (two) times daily.    . metoprolol succinate (TOPROL XL) 25 MG 24 hr tablet Take 1 tablet (25 mg total) by mouth daily. 90 tablet 3  . NICODERM CQ 21 MG/24HR patch Place 1 patch (21 mg total) onto the skin daily. 30 patch 2  . nitroGLYCERIN (NITROSTAT) 0.4 MG SL tablet Place 1 tablet (0.4 mg total) under the tongue every 5 (five) minutes as needed for chest pain. 25 tablet 3  . omeprazole (PRILOSEC) 40 MG capsule Reported on 08/21/2015  0  . pregabalin (LYRICA) 75 MG capsule 1 cap three times daily and 2 caps at bedtime 450 capsule 1  . rosuvastatin (CRESTOR) 5 MG tablet Take 1 tablet (5 mg total) by mouth daily. 90 tablet 3  . tetrahydrozoline 0.05 % ophthalmic solution Place 1 drop into both eyes as needed. Red eyes    . topiramate (TOPAMAX) 25 MG tablet 3 tabs twice daily if necessary 540 tablet 2  . zolpidem (AMBIEN) 10 MG tablet Take 10 mg by mouth at bedtime as needed for sleep.   0  . isosorbide mononitrate (IMDUR) 30 MG 24 hr tablet Take 0.5 tablets (15 mg total) by mouth daily. 30 tablet 3  . sildenafil (VIAGRA) 100 MG  tablet Take by mouth.     No current facility-administered medications for this visit.    Facility-Administered Medications Ordered in Other Visits  Medication Dose Route Frequency Provider Last Rate Last Dose  . 0.9 %  sodium chloride infusion   Intravenous Continuous [name], MD 50 mL/hr at 03/07/14 1005    . acetaminophen (TYLENOL) tablet 650 mg  650 mg Oral Q6H PRN [name], MD   650 mg at 03/07/14 1012  . sodium chloride 0.9 % injection 10 mL  10 mL Intracatheter PRN Marcy Panning, MD   10 mL at  08/22/12 1721  . sodium chloride 0.9 % injection 10 mL  10 mL Intravenous PRN Marcy Panning, MD   10 mL at 05/29/13 1323    Allergies: Allergies  Allergen Reactions  . Codeine Hives    Pt states he can take a few, more reaction with extended doses.    Social History: The patient  reports that he has been smoking Cigarettes.  He has a 22.00 pack-year smoking history. He has quit using smokeless tobacco. He reports that he does not drink alcohol or use drugs.   Family History: The patient's family history includes Cancer in his mother; Heart disease in his father; Heart failure (age of onset: 19) in his father; Lung cancer (age of onset: 42) in his mother.   Review of Systems: Please see the history of present illness.   Otherwise, the review of systems is positive for none.   All other systems are reviewed and negative.   Physical Exam: VS:  BP 110/64   Pulse 66   Ht 6' (1.829 m)   Wt 223 lb 1.9 oz (101.2 kg)   SpO2 94% Comment: at rest  BMI 30.26 kg/m  .  BMI Body mass index is 30.26 kg/m.  Wt Readings from Last 3 Encounters:  11/30/15 223 lb 1.9 oz (101.2 kg)  10/30/15 227 lb (103 kg)  08/21/15 219 lb (99.3 kg)    General: Pleasant. Well developed, well nourished and in no acute distress. He has lost 4 pounds since last visit.   HEENT: Normal.  Neck: Supple, no JVD, carotid bruits, or masses noted.  Cardiac: Regular rate and rhythm. No murmurs, rubs, or gallops. No  edema.  Respiratory:  Lungs are clear to auscultation bilaterally with normal work of breathing.  GI: Soft and nontender.  MS: No deformity or atrophy. Gait and ROM intact.  Skin: Warm and dry. Color is normal.  Neuro:  Strength and sensation are intact and no gross focal deficits noted.  Psych: Alert, appropriate and with normal affect.   LABORATORY DATA:  EKG:  EKG is ordered today. This shows NSR.   Lab Results  Component Value Date   WBC 5.4 11/13/2015   HGB 15.4 11/13/2015   HCT 45.0 11/13/2015   PLT 92 (L) 11/13/2015   GLUCOSE 186 (H) 10/30/2015   CHOL 151 10/30/2015   TRIG 123 10/30/2015   HDL 43 10/30/2015   LDLDIRECT 117.6 08/22/2011   LDLCALC 83 10/30/2015   ALT 21 10/30/2015   AST 19 10/30/2015   NA 138 10/30/2015   K 4.2 10/30/2015   CL 109 10/30/2015   CREATININE 1.06 10/30/2015   BUN 11 10/30/2015   CO2 22 10/30/2015   TSH 2.00 08/15/2014   INR 1.0 09/22/2014   HGBA1C 7.2 (H) 10/30/2015    BNP (last 3 results) No results for input(s): BNP in the last 8760 hours.  ProBNP (last 3 results) No results for input(s): PROBNP in the last 8760 hours.   Other Studies Reviewed Today:  Coronary angiography from 09/2014:  Coronary dominance: right  Left mainstem: Normal.  Left anterior descending (LAD): The stent in the proximal LAD is widely patent throughout. There is moderate 20% narrowing in the LAD proximal to the stent. The remainder of the vessels without significant disease.  Left circumflex (LCx): The left circumflex gives rise to 2 large marginal branches. There is 20% narrowing prior to the takeoff of the first OM.  Right coronary artery (RCA): The right coronary is a dominant vessel. It  has diffuse 70% disease in the proximal to mid vessel. Is occluded at the crux. There are excellent right to right and left to right collaterals.  Left ventriculography: Left ventricular systolic function is normal, LVEF is estimated at 55-65%, there is no  significant mitral regurgitation  Final Conclusions:  1. Single vessel obstructive coronary disease. 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 The stent in the proximal LAD is widely patent.  2. Normal LV function.  Recommendations: Continue medical management.  Peter Martinique  08/26/2011, 9:09 AM   Assessment / Plan: 1. CAD - last cath with stable findings back in August of 2016. He has a chronic total occlusion of the RCA with good collateral flow - stent in the proximal LAD was patent. He was felt to be best served with medical management. Would like to hold on Myoview - felt this would most likely be abnormal due to the total RCA occlusion.  Discussed with Dr. Acie Fredrickson who is here in the office today - will continue with his current medical management. Send for CXR today. Will see back in early December.   2. HTN - BP good on his current therapy.   3. HLD - on statin therapy  4. ED - stopping Viagra for now.   5. Tobacco abuse - continues to struggle with stopping.   6. CLL/chronic thrombocytopenia - followed by oncology/hematology.   7. NIDDM - recheck A1C today.  8. Weight gain - cautioned about this today. He has lost 4 pounds over the past month.  Current medicines are reviewed with the patient today.  The patient does not have concerns regarding medicines other than what has been noted above.  The following changes have been made:  See above.  Labs/ tests ordered today include:    Orders Placed This Encounter  Procedures  . DG Chest 2 View  . EKG 12-Lead     Disposition:   FU with me in early December.   Patient is agreeable to this plan and will call if any problems develop in the interim.   Signed: Burtis Junes, RN, ANP-C 11/30/2015 8:32 AM  [facility] [address] [place], [place]  [address] Phone: [phone] Fax: [phone]

## 2015-11-30 NOTE — Patient Instructions (Addendum)
We will be checking the following labs today - NONE   Medication Instructions:    Continue with your current medicines.     Testing/Procedures To Be Arranged:  CXR - Please go to [facility] to [place] on the first floor for a chest Xray - you may walk in.    Follow-Up:   See me back in early December.     Other Special Instructions:   N/A    If you need a refill on your cardiac medications before your next appointment, please call your pharmacy.   Call the [facility] office at [phone] if you have any questions, problems or concerns.

## 2015-12-10 ENCOUNTER — Encounter: Payer: Self-pay | Admitting: *Deleted

## 2015-12-11 ENCOUNTER — Other Ambulatory Visit (HOSPITAL_BASED_OUTPATIENT_CLINIC_OR_DEPARTMENT_OTHER): Payer: BLUE CROSS/BLUE SHIELD

## 2015-12-11 ENCOUNTER — Ambulatory Visit (HOSPITAL_BASED_OUTPATIENT_CLINIC_OR_DEPARTMENT_OTHER): Payer: BLUE CROSS/BLUE SHIELD

## 2015-12-11 VITALS — BP 120/64 | HR 50 | Temp 97.7°F | Resp 16

## 2015-12-11 DIAGNOSIS — C9110 Chronic lymphocytic leukemia of B-cell type not having achieved remission: Secondary | ICD-10-CM | POA: Diagnosis not present

## 2015-12-11 DIAGNOSIS — D801 Nonfamilial hypogammaglobulinemia: Secondary | ICD-10-CM

## 2015-12-11 DIAGNOSIS — Z95828 Presence of other vascular implants and grafts: Secondary | ICD-10-CM

## 2015-12-11 LAB — CBC WITH DIFFERENTIAL/PLATELET
BASO%: 0.2 % (ref 0.0–2.0)
Basophils Absolute: 0 10*3/uL (ref 0.0–0.1)
EOS%: 0.4 % (ref 0.0–7.0)
Eosinophils Absolute: 0 10*3/uL (ref 0.0–0.5)
HCT: 43.1 % (ref 38.4–49.9)
HGB: 15.3 g/dL (ref 13.0–17.1)
LYMPH%: 25.9 % (ref 14.0–49.0)
MCH: 33 pg (ref 27.2–33.4)
MCHC: 35.5 g/dL (ref 32.0–36.0)
MCV: 93.1 fL (ref 79.3–98.0)
MONO#: 0.4 10*3/uL (ref 0.1–0.9)
MONO%: 9.1 % (ref 0.0–14.0)
NEUT%: 64.4 % (ref 39.0–75.0)
NEUTROS ABS: 3 10*3/uL (ref 1.5–6.5)
PLATELETS: 72 10*3/uL — AB (ref 140–400)
RBC: 4.63 10*6/uL (ref 4.20–5.82)
RDW: 14.3 % (ref 11.0–14.6)
WBC: 4.6 10*3/uL (ref 4.0–10.3)
lymph#: 1.2 10*3/uL (ref 0.9–3.3)

## 2015-12-11 MED ORDER — HEPARIN SOD (PORK) LOCK FLUSH 100 UNIT/ML IV SOLN
500.0000 [IU] | Freq: Once | INTRAVENOUS | Status: AC
  Administered 2015-12-11: 500 [IU] via INTRAVENOUS
  Filled 2015-12-11: qty 5

## 2015-12-11 MED ORDER — ACETAMINOPHEN 325 MG PO TABS
ORAL_TABLET | ORAL | Status: AC
  Filled 2015-12-11: qty 2

## 2015-12-11 MED ORDER — DIPHENHYDRAMINE HCL 25 MG PO TABS
25.0000 mg | ORAL_TABLET | Freq: Once | ORAL | Status: AC
  Administered 2015-12-11: 25 mg via ORAL
  Filled 2015-12-11: qty 1

## 2015-12-11 MED ORDER — ACETAMINOPHEN 325 MG PO TABS
650.0000 mg | ORAL_TABLET | Freq: Four times a day (QID) | ORAL | Status: DC | PRN
  Administered 2015-12-11: 650 mg via ORAL

## 2015-12-11 MED ORDER — SODIUM CHLORIDE 0.9% FLUSH
10.0000 mL | INTRAVENOUS | Status: DC | PRN
  Administered 2015-12-11: 10 mL via INTRAVENOUS
  Filled 2015-12-11: qty 10

## 2015-12-11 MED ORDER — IMMUNE GLOBULIN (HUMAN) 10 GM/100ML IV SOLN
0.5000 g/kg | Freq: Once | INTRAVENOUS | Status: AC
  Administered 2015-12-11: 50 g via INTRAVENOUS
  Filled 2015-12-11: qty 400

## 2015-12-11 MED ORDER — DIPHENHYDRAMINE HCL 25 MG PO CAPS
ORAL_CAPSULE | ORAL | Status: AC
  Filled 2015-12-11: qty 1

## 2015-12-11 MED ORDER — DEXTROSE 5 % IV SOLN
Freq: Once | INTRAVENOUS | Status: AC
  Administered 2015-12-11: 09:00:00 via INTRAVENOUS

## 2015-12-11 NOTE — Patient Instructions (Signed)

## 2015-12-15 ENCOUNTER — Ambulatory Visit (HOSPITAL_COMMUNITY): Payer: BLUE CROSS/BLUE SHIELD | Attending: Cardiology

## 2015-12-15 ENCOUNTER — Other Ambulatory Visit: Payer: Self-pay

## 2015-12-15 DIAGNOSIS — F17210 Nicotine dependence, cigarettes, uncomplicated: Secondary | ICD-10-CM | POA: Insufficient documentation

## 2015-12-15 DIAGNOSIS — E119 Type 2 diabetes mellitus without complications: Secondary | ICD-10-CM | POA: Diagnosis not present

## 2015-12-15 DIAGNOSIS — I739 Peripheral vascular disease, unspecified: Secondary | ICD-10-CM | POA: Diagnosis not present

## 2015-12-15 DIAGNOSIS — I071 Rheumatic tricuspid insufficiency: Secondary | ICD-10-CM | POA: Diagnosis not present

## 2015-12-15 DIAGNOSIS — E785 Hyperlipidemia, unspecified: Secondary | ICD-10-CM | POA: Insufficient documentation

## 2015-12-15 DIAGNOSIS — Z6830 Body mass index (BMI) 30.0-30.9, adult: Secondary | ICD-10-CM | POA: Insufficient documentation

## 2015-12-15 DIAGNOSIS — I10 Essential (primary) hypertension: Secondary | ICD-10-CM | POA: Diagnosis not present

## 2015-12-15 DIAGNOSIS — I252 Old myocardial infarction: Secondary | ICD-10-CM | POA: Diagnosis not present

## 2015-12-15 DIAGNOSIS — I517 Cardiomegaly: Secondary | ICD-10-CM | POA: Diagnosis present

## 2015-12-19 ENCOUNTER — Other Ambulatory Visit: Payer: Self-pay | Admitting: Nurse Practitioner

## 2016-01-10 ENCOUNTER — Other Ambulatory Visit: Payer: Self-pay | Admitting: Nurse Practitioner

## 2016-01-10 DIAGNOSIS — I2510 Atherosclerotic heart disease of native coronary artery without angina pectoris: Secondary | ICD-10-CM

## 2016-01-10 DIAGNOSIS — E785 Hyperlipidemia, unspecified: Secondary | ICD-10-CM

## 2016-01-12 ENCOUNTER — Other Ambulatory Visit: Payer: Self-pay | Admitting: Nurse Practitioner

## 2016-01-12 ENCOUNTER — Other Ambulatory Visit: Payer: Self-pay | Admitting: Hematology and Oncology

## 2016-01-12 DIAGNOSIS — C9110 Chronic lymphocytic leukemia of B-cell type not having achieved remission: Secondary | ICD-10-CM

## 2016-01-14 ENCOUNTER — Encounter: Payer: Self-pay | Admitting: Nurse Practitioner

## 2016-01-15 ENCOUNTER — Encounter: Payer: Self-pay | Admitting: Hematology and Oncology

## 2016-01-15 ENCOUNTER — Ambulatory Visit (HOSPITAL_BASED_OUTPATIENT_CLINIC_OR_DEPARTMENT_OTHER): Payer: BLUE CROSS/BLUE SHIELD | Admitting: Hematology and Oncology

## 2016-01-15 ENCOUNTER — Other Ambulatory Visit (HOSPITAL_BASED_OUTPATIENT_CLINIC_OR_DEPARTMENT_OTHER): Payer: BLUE CROSS/BLUE SHIELD

## 2016-01-15 ENCOUNTER — Telehealth: Payer: Self-pay | Admitting: Hematology and Oncology

## 2016-01-15 ENCOUNTER — Ambulatory Visit (HOSPITAL_BASED_OUTPATIENT_CLINIC_OR_DEPARTMENT_OTHER): Payer: BLUE CROSS/BLUE SHIELD

## 2016-01-15 ENCOUNTER — Telehealth: Payer: Self-pay | Admitting: *Deleted

## 2016-01-15 VITALS — BP 123/60 | HR 64 | Temp 98.1°F | Resp 17 | Ht 72.0 in | Wt 229.9 lb

## 2016-01-15 VITALS — BP 116/73 | HR 52 | Temp 98.3°F | Resp 16

## 2016-01-15 DIAGNOSIS — D696 Thrombocytopenia, unspecified: Secondary | ICD-10-CM

## 2016-01-15 DIAGNOSIS — D801 Nonfamilial hypogammaglobulinemia: Secondary | ICD-10-CM | POA: Diagnosis not present

## 2016-01-15 DIAGNOSIS — C9110 Chronic lymphocytic leukemia of B-cell type not having achieved remission: Secondary | ICD-10-CM

## 2016-01-15 DIAGNOSIS — Z720 Tobacco use: Secondary | ICD-10-CM | POA: Diagnosis not present

## 2016-01-15 DIAGNOSIS — R1310 Dysphagia, unspecified: Secondary | ICD-10-CM

## 2016-01-15 LAB — CBC WITH DIFFERENTIAL/PLATELET
BASO%: 0.4 % (ref 0.0–2.0)
Basophils Absolute: 0 10*3/uL (ref 0.0–0.1)
EOS%: 0.2 % (ref 0.0–7.0)
Eosinophils Absolute: 0 10*3/uL (ref 0.0–0.5)
HCT: 46.5 % (ref 38.4–49.9)
HGB: 15.6 g/dL (ref 13.0–17.1)
LYMPH%: 26 % (ref 14.0–49.0)
MCH: 32.8 pg (ref 27.2–33.4)
MCHC: 33.7 g/dL (ref 32.0–36.0)
MCV: 97.3 fL (ref 79.3–98.0)
MONO#: 0.6 10*3/uL (ref 0.1–0.9)
MONO%: 8.3 % (ref 0.0–14.0)
NEUT%: 65.1 % (ref 39.0–75.0)
NEUTROS ABS: 4.6 10*3/uL (ref 1.5–6.5)
PLATELETS: 82 10*3/uL — AB (ref 140–400)
RBC: 4.77 10*6/uL (ref 4.20–5.82)
RDW: 14.3 % (ref 11.0–14.6)
WBC: 7 10*3/uL (ref 4.0–10.3)
lymph#: 1.8 10*3/uL (ref 0.9–3.3)

## 2016-01-15 MED ORDER — ACETAMINOPHEN 325 MG PO TABS
650.0000 mg | ORAL_TABLET | Freq: Once | ORAL | Status: AC
Start: 2016-01-15 — End: 2016-01-15
  Administered 2016-01-15: 650 mg via ORAL

## 2016-01-15 MED ORDER — DIPHENHYDRAMINE HCL 25 MG PO TABS
25.0000 mg | ORAL_TABLET | Freq: Once | ORAL | Status: AC
  Administered 2016-01-15: 25 mg via ORAL
  Filled 2016-01-15: qty 1

## 2016-01-15 MED ORDER — HEPARIN SOD (PORK) LOCK FLUSH 100 UNIT/ML IV SOLN
500.0000 [IU] | Freq: Once | INTRAVENOUS | Status: AC
  Administered 2016-01-15: 500 [IU] via INTRAVENOUS
  Filled 2016-01-15: qty 5

## 2016-01-15 MED ORDER — ACETAMINOPHEN 325 MG PO TABS
ORAL_TABLET | ORAL | Status: AC
  Filled 2016-01-15: qty 2

## 2016-01-15 MED ORDER — IMMUNE GLOBULIN (HUMAN) 10 GM/100ML IV SOLN
0.5000 g/kg | Freq: Once | INTRAVENOUS | Status: AC
  Administered 2016-01-15: 50 g via INTRAVENOUS
  Filled 2016-01-15: qty 400

## 2016-01-15 MED ORDER — ACYCLOVIR 400 MG PO TABS: 400.0000 mg | ORAL_TABLET | Freq: Every day | ORAL | 3 refills | Status: DC

## 2016-01-15 MED ORDER — DIPHENHYDRAMINE HCL 25 MG PO CAPS
ORAL_CAPSULE | ORAL | Status: AC
  Filled 2016-01-15: qty 1

## 2016-01-15 MED ORDER — SODIUM CHLORIDE 0.9% FLUSH
10.0000 mL | INTRAVENOUS | Status: DC | PRN
  Administered 2016-01-15: 10 mL via INTRAVENOUS
  Filled 2016-01-15: qty 10

## 2016-01-15 NOTE — Assessment & Plan Note (Signed)
This is due to ITP/splenomegaly. He is receiving IVIG. He is not symptomatic. There is no contraindication for him to remain on aspirin as well as the patient had no clinical bleeding.  

## 2016-01-15 NOTE — Telephone Encounter (Signed)
Message sent to chemo scheduler to be added. Appointments scheduled per 01/15/16 los. A copy of the AVS report and appointment schedule was given to the patient, per 01/15/16 los.

## 2016-01-15 NOTE — Assessment & Plan Note (Signed)
He is motivated to quit smoking and is down to half a pack of cigarettes per day.  I continue to encourage him with his smoking cessation effort.

## 2016-01-15 NOTE — Patient Instructions (Signed)

## 2016-01-15 NOTE — Progress Notes (Signed)
[facility] OFFICE PROGRESS NOTE  Patient Care Team: [name], PA-C as PCP - General (Family Medicine) Burtis Junes, NP as Nurse Practitioner (Cardiology)  SUMMARY OF ONCOLOGIC HISTORY:  This patient was originally diagnosed with CLL after he was found to have leukocytosis. Imaging study with CT scan show diffuse lymphadenopathy with splenomegaly. Due to worsening thrombocytopenia, he was subsequently treated with combination therapy with fludarabine, Cytoxan and rituximab from May 2014 to October 2014. The patient denies side effects or complications from treatment. He has significant smoking history with suspected COPD. 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 From October 2014 onwards, he was prescribed monthly IVIG infusion.  INTERVAL HISTORY: Please see below for problem oriented charting. He had one episode of infection in the summer but no further recurrence since then. He continues to take acyclovir. He denies recent new lymphadenopathy. He complained of difficulties with swallowing and pain recently. He had no recent weight loss. No recent changes in bowel habits. He is attempting to quit smoking now The patient denies any recent signs or symptoms of bleeding such as spontaneous epistaxis, hematuria or hematochezia.  REVIEW OF SYSTEMS:   Constitutional: Denies fevers, chills or abnormal weight loss Eyes: Denies blurriness of vision Ears, nose, mouth, throat, and face: Denies mucositis or sore throat Respiratory: Denies cough, dyspnea or wheezes Cardiovascular: Denies palpitation, chest discomfort or lower extremity swelling Gastrointestinal:  Denies nausea, heartburn or change in bowel habits Skin: Denies abnormal skin rashes Lymphatics: Denies new lymphadenopathy or easy bruising Neurological:Denies numbness, tingling or new weaknesses Behavioral/Psych: Mood is stable, no new changes  All other  systems were reviewed with the patient and are negative.  I have reviewed the past medical history, past surgical history, social history and family history with the patient and they are unchanged from previous note.  ALLERGIES:  is allergic to codeine.  MEDICATIONS:  Current Outpatient Prescriptions  Medication Sig Dispense Refill  . acetaminophen (TYLENOL) 500 MG tablet Take 500 mg by mouth every 6 (six) hours as needed for mild pain, moderate pain or headache.    Marland Kitchen acyclovir (ZOVIRAX) 400 MG tablet Take 1 tablet (400 mg total) by mouth daily. 90 tablet 3  . ALPRAZolam (XANAX) 0.25 MG tablet Take 1 po q 6 hours prn for anxiety (Patient taking differently: Take 0.25 mg by mouth 2 (two) times daily as needed for anxiety. ) 60 tablet 0  . aspirin 325 MG tablet Take 325 mg by mouth daily.      . benzonatate (TESSALON) 100 MG capsule One to two pills three times per day for cough    . COMBIVENT RESPIMAT 20-100 MCG/ACT AERS respimat 1 puff every 6 (six) hours as needed for wheezing or shortness of breath.   0  . cyanocobalamin (,VITAMIN B-12,) 1000 MCG/ML injection Inject 1 mL (1,000 mcg total) into the muscle every 30 (thirty) days. 10 mL 1  . fluticasone (FLOVENT HFA) 110 MCG/ACT inhaler Inhale 1 puff into the lungs as needed. (Patient taking differently: Inhale 1 puff into the lungs as needed (shortness of breath). ) 1 Inhaler 1  . HYDROcodone-acetaminophen (NORCO) 10-325 MG tablet take 1 tablet by mouth every 6 to 8 hours if needed for pain  0  . lidocaine-prilocaine (EMLA) cream apply to affected area topically if needed 30 g 3  . meclizine (ANTIVERT) 12.5 MG tablet Take 12.5 mg by mouth every 6 (six) hours as needed for dizziness.   0  . metFORMIN (GLUCOPHAGE-XR)  500 MG 24 hr tablet Take 500 mg by mouth 2 (two) times daily.    . metoprolol succinate (TOPROL XL) 25 MG 24 hr tablet Take 1 tablet (25 mg total) by mouth daily. 90 tablet 3  . NICODERM CQ 21 MG/24HR patch Place 1 patch (21 mg  total) onto the skin daily. 30 patch 2  . nitroGLYCERIN (NITROSTAT) 0.4 MG SL tablet place 1 tablet under the tongue if needed every 5 minutes for chest pain 25 tablet 3  . omeprazole (PRILOSEC) 40 MG capsule Reported on 08/21/2015  0  . pregabalin (LYRICA) 75 MG capsule 1 cap three times daily and 2 caps at bedtime 450 capsule 1  . rosuvastatin (CRESTOR) 5 MG tablet take 1 tablet by mouth once daily 90 tablet 3  . sildenafil (VIAGRA) 100 MG tablet Take by mouth.    . tetrahydrozoline 0.05 % ophthalmic solution Place 1 drop into both eyes as needed. Red eyes    . topiramate (TOPAMAX) 25 MG tablet 3 tabs twice daily if necessary 540 tablet 2  . zolpidem (AMBIEN) 10 MG tablet Take 10 mg by mouth at bedtime as needed for sleep.   0  . isosorbide mononitrate (IMDUR) 30 MG 24 hr tablet Take 0.5 tablets (15 mg total) by mouth daily. 30 tablet 3   No current facility-administered medications for this visit.    Facility-Administered Medications Ordered in Other Visits  Medication Dose Route Frequency Provider Last Rate Last Dose  . 0.9 %  sodium chloride infusion   Intravenous Continuous [name], MD 50 mL/hr at 03/07/14 1005    . acetaminophen (TYLENOL) tablet 650 mg  650 mg Oral Q6H PRN [name], MD   650 mg at 03/07/14 1012  . acetaminophen (TYLENOL) tablet 650 mg  650 mg Oral Q6H PRN [name], MD      . diphenhydrAMINE (BENADRYL) tablet 25 mg  25 mg Oral Once [name], MD      . Immune Globulin 10% (PRIVIGEN) IV infusion 50 g  0.5 g/kg Intravenous Once [name], MD      . sodium chloride 0.9 % injection 10 mL  10 mL Intracatheter PRN Marcy Panning, MD   10 mL at 08/22/12 1721  . sodium chloride 0.9 % injection 10 mL  10 mL Intravenous PRN Marcy Panning, MD   10 mL at 05/29/13 1323    PHYSICAL EXAMINATION: ECOG PERFORMANCE STATUS: 1 - Symptomatic but completely ambulatory  Vitals:   01/15/16 0847  BP: 123/60  Pulse: 64  Resp: 17  Temp: 98.1 F (36.7 C)   Filed Weights   01/15/16  0847  Weight: 229 lb 14.4 oz (104.3 kg)    GENERAL:alert, no distress and comfortable SKIN: skin color, texture, turgor are normal, no rashes or significant lesions EYES: normal, Conjunctiva are pink and non-injected, sclera clear OROPHARYNX:no exudate, no erythema and lips, buccal mucosa, and tongue normal  NECK: supple, thyroid normal size, non-tender, without nodularity LYMPH:  no palpable lymphadenopathy in the cervical, axillary or inguinal LUNGS: clear to auscultation and percussion with normal breathing effort HEART: regular rate & rhythm and no murmurs and no lower extremity edema ABDOMEN:abdomen soft, non-tender and normal bowel sounds Musculoskeletal:no cyanosis of digits and no clubbing  NEURO: alert & oriented x 3 with fluent speech, no focal motor/sensory deficits  LABORATORY DATA:  I have reviewed the data as listed    Component Value Date/Time   NA 138 10/30/2015 0842   NA 139 01/02/2015 0919  K 4.2 10/30/2015 0842   K 4.5 01/02/2015 0919   CL 109 10/30/2015 0842   CL 104 08/03/2012 1229   CO2 22 10/30/2015 0842   CO2 25 01/02/2015 0919   GLUCOSE 186 (H) 10/30/2015 0842   GLUCOSE 269 (H) 01/02/2015 0919   GLUCOSE 223 (H) 08/03/2012 1229   BUN 11 10/30/2015 0842   BUN 8.1 01/02/2015 0919   CREATININE 1.06 10/30/2015 0842   CREATININE 1.0 01/02/2015 0919   CALCIUM 8.8 10/30/2015 0842   CALCIUM 9.5 01/02/2015 0919   PROT 6.5 10/30/2015 0842   PROT 6.5 01/02/2015 0919   ALBUMIN 4.0 10/30/2015 0842   ALBUMIN 3.9 01/02/2015 0919   AST 19 10/30/2015 0842   AST 14 01/02/2015 0919   ALT 21 10/30/2015 0842   ALT 17 01/02/2015 0919   ALKPHOS 64 10/30/2015 0842   ALKPHOS 98 01/02/2015 0919   BILITOT 0.6 10/30/2015 0842   BILITOT 0.67 01/02/2015 0919   GFRNONAA >90 02/03/2014 1355   GFRAA >90 02/03/2014 1355    No results found for: SPEP, UPEP  Lab Results  Component Value Date   WBC 7.0 01/15/2016   NEUTROABS 4.6 01/15/2016   HGB 15.6 01/15/2016   HCT  46.5 01/15/2016   MCV 97.3 01/15/2016   PLT 82 (L) 01/15/2016      Chemistry      Component Value Date/Time   NA 138 10/30/2015 0842   NA 139 01/02/2015 0919   K 4.2 10/30/2015 0842   K 4.5 01/02/2015 0919   CL 109 10/30/2015 0842   CL 104 08/03/2012 1229   CO2 22 10/30/2015 0842   CO2 25 01/02/2015 0919   BUN 11 10/30/2015 0842   BUN 8.1 01/02/2015 0919   CREATININE 1.06 10/30/2015 0842   CREATININE 1.0 01/02/2015 0919      Component Value Date/Time   CALCIUM 8.8 10/30/2015 0842   CALCIUM 9.5 01/02/2015 0919   ALKPHOS 64 10/30/2015 0842   ALKPHOS 98 01/02/2015 0919   AST 19 10/30/2015 0842   AST 14 01/02/2015 0919   ALT 21 10/30/2015 0842   ALT 17 01/02/2015 0919   BILITOT 0.6 10/30/2015 0842   BILITOT 0.67 01/02/2015 0919      ASSESSMENT & PLAN:  CLL (chronic lymphocytic leukemia) The patient has achieved complete response clinically. However, he complained of new onset of dysphagia/odynophagia His last imaging study was over 2 years ago. Even though he has no clinical palpable lymphadenopathy, I am concerned that he may have some recurrence in his chest, especially with his smoking history, I think it is prudent to proceed to order imaging study. He agreed with the plan of care. I will order CT scan of the chest with contrast for further evaluation  Hypogammaglobulinemia, acquired He has no recent significant infections since we resume IVIG The risk and benefits and side effects of IVIG infusion has been discussed with patient and he agreed to proceed. He has no side effects of IVIG or signs of serum sickness. 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 He is receiving IVIG. He is not symptomatic. There is no contraindication for him to remain on aspirin as well as the patient had no clinical bleeding.   Tobacco abuse He is motivated to quit smoking and is down to half a pack of cigarettes per day.   I continue to encourage him with his smoking cessation effort.  Odynophagia The patient complained of new onset of odynophagia.  I recommend CT imaging study. If that is unremarkable to explain the cause of this, I will refer him back to GI for endoscopy evaluation. He agreed   Orders Placed This Encounter  Procedures  . CT CHEST W CONTRAST    Standing Status:   Future    Standing Expiration Date:   02/18/2017    Order Specific Question:   Reason for exam:    Answer:   dysphagia, CLL, exclude recurrence    Order Specific Question:   Preferred imaging location?    Answer:   [facility]   All questions were answered. The patient knows to call the clinic with any problems, questions or concerns. No barriers to learning was detected. I spent 25 minutes counseling the patient face to face. The total time spent in the appointment was 40 minutes and more than 50% was on counseling and review of test results     [name], MD 01/15/2016 10:03 AM

## 2016-01-15 NOTE — Assessment & Plan Note (Addendum)
He has no recent significant infections since we resume IVIG The risk and benefits and side effects of IVIG infusion has been discussed with patient and he agreed to proceed. He has no side effects of IVIG or signs of serum sickness. Plan to continue monthly/q 4 weeks IVIG indefinitely

## 2016-01-15 NOTE — Telephone Encounter (Signed)
Per LOS I have scheduled appts and notified scheduler 

## 2016-01-15 NOTE — Assessment & Plan Note (Signed)
The patient complained of new onset of odynophagia. I recommend CT imaging study. If that is unremarkable to explain the cause of this, I will refer him back to GI for endoscopy evaluation. He agreed

## 2016-01-15 NOTE — Assessment & Plan Note (Signed)
The patient has achieved complete response clinically. However, he complained of new onset of dysphagia/odynophagia His last imaging study was over 2 years ago. Even though he has no clinical palpable lymphadenopathy, I am concerned that he may have some recurrence in his chest, especially with his smoking history, I think it is prudent to proceed to order imaging study. He agreed with the plan of care. I will order CT scan of the chest with contrast for further evaluation

## 2016-01-18 ENCOUNTER — Other Ambulatory Visit: Payer: Self-pay | Admitting: *Deleted

## 2016-01-18 DIAGNOSIS — C9110 Chronic lymphocytic leukemia of B-cell type not having achieved remission: Secondary | ICD-10-CM

## 2016-01-19 ENCOUNTER — Telehealth: Payer: Self-pay | Admitting: *Deleted

## 2016-01-19 NOTE — Telephone Encounter (Signed)
LM to have labs done at 1130 @ [facility] prior to CT

## 2016-01-20 ENCOUNTER — Encounter: Payer: Self-pay | Admitting: Vascular Surgery

## 2016-01-20 ENCOUNTER — Ambulatory Visit (HOSPITAL_COMMUNITY)
Admission: RE | Admit: 2016-01-20 | Discharge: 2016-01-20 | Disposition: A | Discharge status: Home / Self Care | Payer: BLUE CROSS/BLUE SHIELD | Source: Ambulatory Visit | Attending: Hematology and Oncology | Admitting: Hematology and Oncology

## 2016-01-20 ENCOUNTER — Encounter (HOSPITAL_COMMUNITY): Payer: Self-pay

## 2016-01-20 ENCOUNTER — Other Ambulatory Visit (HOSPITAL_BASED_OUTPATIENT_CLINIC_OR_DEPARTMENT_OTHER): Payer: BLUE CROSS/BLUE SHIELD

## 2016-01-20 ENCOUNTER — Ambulatory Visit: Payer: Self-pay | Admitting: Nurse Practitioner

## 2016-01-20 DIAGNOSIS — I2510 Atherosclerotic heart disease of native coronary artery without angina pectoris: Secondary | ICD-10-CM | POA: Insufficient documentation

## 2016-01-20 DIAGNOSIS — C9110 Chronic lymphocytic leukemia of B-cell type not having achieved remission: Secondary | ICD-10-CM

## 2016-01-20 DIAGNOSIS — J439 Emphysema, unspecified: Secondary | ICD-10-CM | POA: Insufficient documentation

## 2016-01-20 DIAGNOSIS — R1310 Dysphagia, unspecified: Secondary | ICD-10-CM | POA: Diagnosis not present

## 2016-01-20 DIAGNOSIS — I700 Atherosclerosis of aorta: Secondary | ICD-10-CM | POA: Insufficient documentation

## 2016-01-20 DIAGNOSIS — R911 Solitary pulmonary nodule: Secondary | ICD-10-CM | POA: Diagnosis not present

## 2016-01-20 DIAGNOSIS — D801 Nonfamilial hypogammaglobulinemia: Secondary | ICD-10-CM | POA: Diagnosis not present

## 2016-01-20 LAB — COMPREHENSIVE METABOLIC PANEL
ALBUMIN: 3.7 g/dL (ref 3.5–5.0)
ALK PHOS: 88 U/L (ref 40–150)
ALT: 19 U/L (ref 0–55)
ANION GAP: 9 meq/L (ref 3–11)
AST: 14 U/L (ref 5–34)
BILIRUBIN TOTAL: 0.69 mg/dL (ref 0.20–1.20)
BUN: 14.6 mg/dL (ref 7.0–26.0)
CALCIUM: 9.3 mg/dL (ref 8.4–10.4)
CO2: 23 mEq/L (ref 22–29)
CREATININE: 1.1 mg/dL (ref 0.7–1.3)
Chloride: 108 mEq/L (ref 98–109)
EGFR: 69 mL/min/{1.73_m2} — AB (ref >90)
Glucose: 221 mg/dl — ABNORMAL HIGH (ref 70–140)
Potassium: 4.5 mEq/L (ref 3.5–5.1)
Sodium: 139 mEq/L (ref 136–145)
TOTAL PROTEIN: 7.3 g/dL (ref 6.4–8.3)

## 2016-01-20 LAB — CBC WITH DIFFERENTIAL/PLATELET
BASO%: 0.3 % (ref 0.0–2.0)
Basophils Absolute: 0 10*3/uL (ref 0.0–0.1)
EOS%: 0.2 % (ref 0.0–7.0)
Eosinophils Absolute: 0 10*3/uL (ref 0.0–0.5)
HEMATOCRIT: 46.9 % (ref 38.4–49.9)
HEMOGLOBIN: 15.7 g/dL (ref 13.0–17.1)
LYMPH#: 1.6 10*3/uL (ref 0.9–3.3)
LYMPH%: 28.3 % (ref 14.0–49.0)
MCH: 32 pg (ref 27.2–33.4)
MCHC: 33.4 g/dL (ref 32.0–36.0)
MCV: 96 fL (ref 79.3–98.0)
MONO#: 0.5 10*3/uL (ref 0.1–0.9)
MONO%: 9.3 % (ref 0.0–14.0)
NEUT#: 3.6 10*3/uL (ref 1.5–6.5)
NEUT%: 61.9 % (ref 39.0–75.0)
Platelets: 79 10*3/uL — ABNORMAL LOW (ref 140–400)
RBC: 4.89 10*6/uL (ref 4.20–5.82)
RDW: 14.3 % (ref 11.0–14.6)
WBC: 5.8 10*3/uL (ref 4.0–10.3)

## 2016-01-20 MED ORDER — IOPAMIDOL (ISOVUE-300) INJECTION 61%
75.0000 mL | Freq: Once | INTRAVENOUS | Status: AC | PRN
  Administered 2016-01-20: 75 mL via INTRAVENOUS

## 2016-01-22 ENCOUNTER — Encounter: Payer: Self-pay | Admitting: Hematology and Oncology

## 2016-01-22 ENCOUNTER — Ambulatory Visit (HOSPITAL_BASED_OUTPATIENT_CLINIC_OR_DEPARTMENT_OTHER): Payer: BLUE CROSS/BLUE SHIELD | Admitting: Hematology and Oncology

## 2016-01-22 DIAGNOSIS — R1310 Dysphagia, unspecified: Secondary | ICD-10-CM | POA: Diagnosis not present

## 2016-01-22 DIAGNOSIS — Z720 Tobacco use: Secondary | ICD-10-CM | POA: Diagnosis not present

## 2016-01-22 DIAGNOSIS — D696 Thrombocytopenia, unspecified: Secondary | ICD-10-CM

## 2016-01-22 NOTE — Assessment & Plan Note (Signed)
This is due to ITP/splenomegaly. He is receiving IVIG. He is not symptomatic. There is no contraindication for him to remain on aspirin as well as the patient had no clinical bleeding.  

## 2016-01-22 NOTE — Progress Notes (Signed)
[facility] OFFICE PROGRESS NOTE  Patient Care Team: [name], PA-C as PCP - General (Family Medicine) Burtis Junes, NP as Nurse Practitioner (Cardiology) Carol Ada, MD as Consulting Physician (Gastroenterology)  SUMMARY OF ONCOLOGIC HISTORY:  This patient was originally diagnosed with CLL after he was found to have leukocytosis. Imaging study with CT scan show diffuse lymphadenopathy with splenomegaly. Due to worsening thrombocytopenia, he was subsequently treated with combination therapy with fludarabine, Cytoxan and rituximab from May 2014 to October 2014. The patient denies side effects or complications from treatment. He has significant smoking history with suspected COPD. 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 From October 2014 onwards, he was prescribed monthly IVIG infusion.  INTERVAL HISTORY Please see below for problem oriented charting. He returns today to review test results. He continues to have mild discomfort/pain with swallowing. The patient denies any recent signs or symptoms of bleeding such as spontaneous epistaxis, hematuria or hematochezia. He is attempting to quit. He still smokes a few cigarettes in the past week. Last smoked last night.  REVIEW OF SYSTEMS:   Constitutional: Denies fevers, chills or abnormal weight loss Eyes: Denies blurriness of vision Ears, nose, mouth, throat, and face: Denies mucositis or sore throat Respiratory: Denies cough, dyspnea or wheezes Cardiovascular: Denies palpitation, chest discomfort or lower extremity swelling Gastrointestinal:  Denies nausea, heartburn or change in bowel habits Skin: Denies abnormal skin rashes Lymphatics: Denies new lymphadenopathy or easy bruising Neurological:Denies numbness, tingling or new weaknesses Behavioral/Psych: Mood is stable, no new changes  All other systems were reviewed with the patient and are negative.  I have  reviewed the past medical history, past surgical history, social history and family history with the patient and they are unchanged from previous note.  ALLERGIES:  is allergic to codeine.  MEDICATIONS:  Current Outpatient Prescriptions  Medication Sig Dispense Refill  . acetaminophen (TYLENOL) 500 MG tablet Take 500 mg by mouth every 6 (six) hours as needed for mild pain, moderate pain or headache.    Marland Kitchen acyclovir (ZOVIRAX) 400 MG tablet Take 1 tablet (400 mg total) by mouth daily. 90 tablet 3  . ALPRAZolam (XANAX) 0.25 MG tablet Take 1 po q 6 hours prn for anxiety (Patient taking differently: Take 0.25 mg by mouth 2 (two) times daily as needed for anxiety. ) 60 tablet 0  . aspirin 325 MG tablet Take 325 mg by mouth daily.      . benzonatate (TESSALON) 100 MG capsule One to two pills three times per day for cough    . COMBIVENT RESPIMAT 20-100 MCG/ACT AERS respimat 1 puff every 6 (six) hours as needed for wheezing or shortness of breath.   0  . cyanocobalamin (,VITAMIN B-12,) 1000 MCG/ML injection Inject 1 mL (1,000 mcg total) into the muscle every 30 (thirty) days. 10 mL 1  . fluticasone (FLOVENT HFA) 110 MCG/ACT inhaler Inhale 1 puff into the lungs as needed. (Patient taking differently: Inhale 1 puff into the lungs as needed (shortness of breath). ) 1 Inhaler 1  . HYDROcodone-acetaminophen (NORCO) 10-325 MG tablet take 1 tablet by mouth every 6 to 8 hours if needed for pain  0  . lidocaine-prilocaine (EMLA) cream apply to affected area topically if needed 30 g 3  . meclizine (ANTIVERT) 12.5 MG tablet Take 12.5 mg by mouth every 6 (six) hours as needed for dizziness.   0  . metFORMIN (GLUCOPHAGE-XR) 500 MG 24 hr tablet Take 500 mg by mouth 2 (two)  times daily.    . metoprolol succinate (TOPROL XL) 25 MG 24 hr tablet Take 1 tablet (25 mg total) by mouth daily. 90 tablet 3  . NICODERM CQ 21 MG/24HR patch Place 1 patch (21 mg total) onto the skin daily. 30 patch 2  . omeprazole (PRILOSEC) 40 MG  capsule Reported on 08/21/2015  0  . pregabalin (LYRICA) 75 MG capsule 1 cap three times daily and 2 caps at bedtime 450 capsule 1  . rosuvastatin (CRESTOR) 5 MG tablet take 1 tablet by mouth once daily 90 tablet 3  . sildenafil (VIAGRA) 100 MG tablet Take by mouth.    . tetrahydrozoline 0.05 % ophthalmic solution Place 1 drop into both eyes as needed. Red eyes    . topiramate (TOPAMAX) 25 MG tablet 3 tabs twice daily if necessary 540 tablet 2  . zolpidem (AMBIEN) 10 MG tablet Take 10 mg by mouth at bedtime as needed for sleep.   0  . isosorbide mononitrate (IMDUR) 30 MG 24 hr tablet Take 0.5 tablets (15 mg total) by mouth daily. 30 tablet 3  . nitroGLYCERIN (NITROSTAT) 0.4 MG SL tablet place 1 tablet under the tongue if needed every 5 minutes for chest pain (Patient not taking: Reported on 01/22/2016) 25 tablet 3   No current facility-administered medications for this visit.    Facility-Administered Medications Ordered in Other Visits  Medication Dose Route Frequency Provider Last Rate Last Dose  . 0.9 %  sodium chloride infusion   Intravenous Continuous [name], MD 50 mL/hr at 03/07/14 1005    . acetaminophen (TYLENOL) tablet 650 mg  650 mg Oral Q6H PRN [name], MD   650 mg at 03/07/14 1012  . sodium chloride 0.9 % injection 10 mL  10 mL Intracatheter PRN Marcy Panning, MD   10 mL at 08/22/12 1721  . sodium chloride 0.9 % injection 10 mL  10 mL Intravenous PRN Marcy Panning, MD   10 mL at 05/29/13 1323    PHYSICAL EXAMINATION: ECOG PERFORMANCE STATUS: 0 - Asymptomatic  Vitals:   01/22/16 1257  BP: 124/65  Pulse: 63  Resp: 20  Temp: 98 F (36.7 C)   Filed Weights   01/22/16 1257  Weight: 231 lb 3 oz (104.9 kg)    GENERAL:alert, no distress and comfortable SKIN: skin color, texture, turgor are normal, no rashes or significant lesions EYES: normal, Conjunctiva are pink and non-injected, sclera clear Musculoskeletal:no cyanosis of digits and no clubbing  NEURO: alert & oriented  x 3 with fluent speech, no focal motor/sensory deficits  LABORATORY DATA:  I have reviewed the data as listed    Component Value Date/Time   NA 139 01/20/2016 1135   K 4.5 01/20/2016 1135   CL 109 10/30/2015 0842   CL 104 08/03/2012 1229   CO2 23 01/20/2016 1135   GLUCOSE 221 (H) 01/20/2016 1135   GLUCOSE 223 (H) 08/03/2012 1229   BUN 14.6 01/20/2016 1135   CREATININE 1.1 01/20/2016 1135   CALCIUM 9.3 01/20/2016 1135   PROT 7.3 01/20/2016 1135   ALBUMIN 3.7 01/20/2016 1135   AST 14 01/20/2016 1135   ALT 19 01/20/2016 1135   ALKPHOS 88 01/20/2016 1135   BILITOT 0.69 01/20/2016 1135   GFRNONAA >90 02/03/2014 1355   GFRAA >90 02/03/2014 1355    No results found for: SPEP, UPEP  Lab Results  Component Value Date   WBC 5.8 01/20/2016   NEUTROABS 3.6 01/20/2016   HGB 15.7 01/20/2016   HCT 46.9  01/20/2016   MCV 96.0 01/20/2016   PLT 79 (L) 01/20/2016      Chemistry      Component Value Date/Time   NA 139 01/20/2016 1135   K 4.5 01/20/2016 1135   CL 109 10/30/2015 0842   CL 104 08/03/2012 1229   CO2 23 01/20/2016 1135   BUN 14.6 01/20/2016 1135   CREATININE 1.1 01/20/2016 1135      Component Value Date/Time   CALCIUM 9.3 01/20/2016 1135   ALKPHOS 88 01/20/2016 1135   AST 14 01/20/2016 1135   ALT 19 01/20/2016 1135   BILITOT 0.69 01/20/2016 1135       RADIOGRAPHIC STUDIES: I have personally reviewed the radiological images as listed and agreed with the findings in the report. Ct Chest W Contrast  Result Date: 01/20/2016 CLINICAL DATA:  History of CLL.  New onset odynophagia. EXAM: CT CHEST WITH CONTRAST TECHNIQUE: Multidetector CT imaging of the chest was performed during intravenous contrast administration. CONTRAST:  41m ISOVUE-300 IOPAMIDOL (ISOVUE-300) INJECTION 61% COMPARISON:  12/19/2012 FINDINGS: Cardiovascular: The heart size is normal. Trace pericardial effusion. Coronary artery calcification is noted. Atherosclerotic calcification is noted in the wall  of the thoracic aorta. Right Port-A-Cath tip positioned at the SVC/RA junction. Mediastinum/Nodes: No mediastinal lymphadenopathy. There is no hilar lymphadenopathy. The esophagus has normal imaging features. There is no axillary lymphadenopathy. Lungs/Pleura: 7 mm right middle lobe pulmonary nodule (image 94 series 5) is unchanged in the interval. Posterior right upper lobe calcified granuloma seen on image 70 is unchanged. Minimal centrilobular and paraseptal emphysema is evident. No focal airspace consolidation. No pulmonary edema or pleural effusion. Upper Abdomen: Splenic vein remains prominent. Spleen incompletely visualized but does not appear substantially changed where visible. Musculoskeletal: Bone windows reveal no worrisome lytic or sclerotic osseous lesions. IMPRESSION: 1. Stable exam.  No new or progressive findings. 2. No CT findings to explain odynophagia. Electronically Signed   By: EMisty StanleyM.D.   On: 01/20/2016 15:41    ASSESSMENT & PLAN:  Odynophagia I reviewed the CT imaging study with the patient. The report of the CT was negative but I looked at the imaging study with the patient in great detail. He has chronic reflux disease and the esophageal wall appeared thickened. I felt that it is prudent for him to undergo further evaluation with upper endoscopy to exclude malignancy and he agrees to proceed. I will send a request to his gastroenterologist to arrange upper endoscopy evaluation.  Tobacco abuse He is motivated to quit smoking and has set quit date for last week  I continue to encourage him with his smoking cessation effort.  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 He is receiving IVIG. He is not symptomatic. There is no contraindication for him to remain on aspirin as well as the patient had no clinical bleeding.    No orders of the defined types were placed in this encounter.  All questions were answered. The patient knows to call the clinic with any  problems, questions or concerns. No barriers to learning was detected. I spent 15 minutes counseling the patient face to face. The total time spent in the appointment was 20 minutes and more than 50% was on counseling and review of test results     NHeath Lark MD 01/22/2016 1:51 PM

## 2016-01-22 NOTE — Assessment & Plan Note (Signed)
I reviewed the CT imaging study with the patient. The report of the CT was negative but I looked at the imaging study with the patient in great detail. He has chronic reflux disease and the esophageal wall appeared thickened. I felt that it is prudent for him to undergo further evaluation with upper endoscopy to exclude malignancy and he agrees to proceed. I will send a request to his gastroenterologist to arrange upper endoscopy evaluation.

## 2016-01-22 NOTE — Assessment & Plan Note (Signed)
He is motivated to quit smoking and has set quit date for last week  I continue to encourage him with his smoking cessation effort.

## 2016-01-25 ENCOUNTER — Encounter: Payer: Self-pay | Admitting: Nurse Practitioner

## 2016-01-25 ENCOUNTER — Ambulatory Visit (INDEPENDENT_AMBULATORY_CARE_PROVIDER_SITE_OTHER): Payer: BLUE CROSS/BLUE SHIELD | Admitting: Nurse Practitioner

## 2016-01-25 VITALS — BP 104/68 | HR 63 | Ht 72.0 in | Wt 227.8 lb

## 2016-01-25 DIAGNOSIS — R0789 Other chest pain: Secondary | ICD-10-CM

## 2016-01-25 DIAGNOSIS — E7800 Pure hypercholesterolemia, unspecified: Secondary | ICD-10-CM | POA: Diagnosis not present

## 2016-01-25 DIAGNOSIS — R5383 Other fatigue: Secondary | ICD-10-CM | POA: Diagnosis not present

## 2016-01-25 DIAGNOSIS — I259 Chronic ischemic heart disease, unspecified: Secondary | ICD-10-CM

## 2016-01-25 DIAGNOSIS — I10 Essential (primary) hypertension: Secondary | ICD-10-CM

## 2016-01-25 MED ORDER — NICODERM CQ 21 MG/24HR TD PT24: 21.0000 mg | MEDICATED_PATCH | Freq: Every day | TRANSDERMAL | 0 refills | Status: DC

## 2016-01-25 MED ORDER — ISOSORBIDE MONONITRATE ER 30 MG PO TB24: 15.0000 mg | ORAL_TABLET | Freq: Every day | ORAL | 3 refills | Status: DC

## 2016-01-25 MED ORDER — NICOTINE 7 MG/24HR TD PT24: 7.0000 mg | MEDICATED_PATCH | Freq: Every day | TRANSDERMAL | 0 refills | Status: DC

## 2016-01-25 MED ORDER — NICOTINE 14 MG/24HR TD PT24: 14.0000 mg | MEDICATED_PATCH | Freq: Every day | TRANSDERMAL | 0 refills | Status: DC

## 2016-01-25 NOTE — Progress Notes (Signed)
Postoperative Visit   History of Present Illness  Ryan Copeland is a 65 y.o. male who presents for postoperative follow-up from procedure on Date: 01/01/15: Ao, [name]  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  The patient's hip sx are improved at this point.  The patient is able to complete their activities of daily living.  The patient's current symptoms are: intermittent joint pain, intermittent calf and ankle pain of vague character.   Past Medical History, Past Surgical History, Social History, Family History, Medications, Allergies, and Review of Systems are unchanged from previous evaluation on 01/23/15.  Current Outpatient Prescriptions  Medication Sig Dispense Refill  . acetaminophen (TYLENOL) 500 MG tablet Take 500 mg by mouth every 6 (six) hours as needed for mild pain, moderate pain or headache.    Marland Kitchen acyclovir (ZOVIRAX) 400 MG tablet Take 1 tablet (400 mg total) by mouth daily. 90 tablet 3  . ALPRAZolam (XANAX) 0.25 MG tablet Take 1 po q 6 hours prn for anxiety (Patient taking differently: Take 0.25 mg by mouth 2 (two) times daily as needed for anxiety. ) 60 tablet 0  . aspirin 325 MG tablet Take 325 mg by mouth daily.      . benzonatate (TESSALON) 100 MG capsule One to two pills three times per day for cough    . COMBIVENT RESPIMAT 20-100 MCG/ACT AERS respimat 1 puff every 6 (six) hours as needed for wheezing or shortness of breath.   0  . cyanocobalamin (,VITAMIN B-12,) 1000 MCG/ML injection Inject 1 mL (1,000 mcg total) into the muscle every 30 (thirty) days. 10 mL 1  . fluticasone (FLOVENT HFA) 110 MCG/ACT inhaler Inhale 1 puff into the lungs as needed. (Patient taking differently: Inhale 1 puff into the lungs as needed (shortness of breath). ) 1 Inhaler 1  . HYDROcodone-acetaminophen (NORCO) 10-325 MG tablet take 1 tablet by mouth every 6 to 8 hours if needed for pain  0  . isosorbide mononitrate (IMDUR) 30 MG  24 hr tablet Take 0.5 tablets (15 mg total) by mouth daily. 45 tablet 3  . lidocaine-prilocaine (EMLA) cream apply to affected area topically if needed 30 g 3  . meclizine (ANTIVERT) 12.5 MG tablet Take 12.5 mg by mouth every 6 (six) hours as needed for dizziness.   0  . metFORMIN (GLUCOPHAGE-XR) 500 MG 24 hr tablet Take 500 mg by mouth 2 (two) times daily.    . metoprolol succinate (TOPROL XL) 25 MG 24 hr tablet Take 1 tablet (25 mg total) by mouth daily. 90 tablet 3  . NICODERM CQ 21 MG/24HR patch Place 1 patch (21 mg total) onto the skin daily. 30 patch 0  . nicotine (NICODERM CQ) 14 mg/24hr patch Place 1 patch (14 mg total) onto the skin daily. 28 patch 0  . nicotine (NICODERM CQ) 7 mg/24hr patch Place 1 patch (7 mg total) onto the skin daily. 28 patch 0  . nitroGLYCERIN (NITROSTAT) 0.4 MG SL tablet place 1 tablet under the tongue if needed every 5 minutes for chest pain 25 tablet 3  . omeprazole (PRILOSEC) 40 MG capsule Reported on 08/21/2015  0  . pregabalin (LYRICA) 75 MG capsule 1 cap three times daily and 2 caps at bedtime 450 capsule 1  . rosuvastatin (CRESTOR) 5 MG tablet take 1 tablet by mouth once daily 90 tablet 3  . tetrahydrozoline 0.05 % ophthalmic solution Place 1 drop into both eyes as needed.  Red eyes    . topiramate (TOPAMAX) 25 MG tablet 3 tabs twice daily if necessary 540 tablet 2  . zolpidem (AMBIEN) 10 MG tablet Take 10 mg by mouth at bedtime as needed for sleep.   0   No current facility-administered medications for this visit.    Facility-Administered Medications Ordered in Other Visits  Medication Dose Route Frequency Provider Last Rate Last Dose  . 0.9 %  sodium chloride infusion   Intravenous Continuous Ryan Lark, MD      . acetaminophen (TYLENOL) tablet 650 mg  650 mg Oral Q6H PRN Ryan Lark, MD   650 mg at 03/07/14 1012  . sodium chloride 0.9 % injection 10 mL  10 mL Intracatheter PRN Marcy Panning, MD   10 mL at 08/22/12 1721  . sodium chloride 0.9 % injection 10  mL  10 mL Intravenous PRN Marcy Panning, MD   10 mL at 05/29/13 1323    Allergies  Allergen Reactions  . Codeine Hives    Pt states he can take a few, more reaction with extended doses.     For VQI Use Only  PRE-ADM LIVING: Home  AMB STATUS: Ambulatory   Physical Examination  Vitals:   01/29/16 0931  BP: 129/68  Pulse: 62  Resp: 18  Temp: 97 F (36.1 C)  SpO2: 98%  Weight: 227 lb (103 kg)  Height: 6' (1.829 m)    Body mass index is 30.79 kg/m.  General: Alert, O x 3, WD,NAD  Pulmonary: Sym exp, good B air movt,CTA B  Cardiac: RRR, Nl S1, S2, no Murmurs, No rubs, No S3,S4  Vascular: Vessel Right Left  Radial Palpable Palpable  Brachial Palpable Palpable  Carotid Palpable, No Bruit Palpable, No Bruit  Aorta Not palpable N/A  Femoral Palpable Palpable  Popliteal Not palpable Not palpable  PT Palpable Palpable  DP Palpable Palpable   Gastrointestinal: soft, non-distended, non-tender to palpation, No guarding or rebound, no HSM, no masses, no CVAT B, No palpable prominent aortic pulse,    Musculoskeletal: M/S 5/5 throughout  , Extremities without ischemic changes  , No edema present,  , No LDS present  Neurologic: CN 2-12 intact , Pain and light touch intact in extremities , Motor exam as listed above  ABI (Date: 01/29/2016)  R:   ABI: 1.23,   DP: tri  PT: tri  TBI:  1.06  L:   ABI: 1.17,   DP: tri  PT: bi  TBI: 0.73   Medical Decision Making  Ryan Copeland is a 65 y.o. male who presents s/p L CIA PTA+S, L pop artery thrombectomy by Dr. Amedeo Plenty, B leg pain of non-vasculogenic origin  At this point, I would continue surveillance with annual BLE ABI given the presence of prior L CIA stent. I discussed in depth with the patient the nature of atherosclerosis, and emphasized the importance of maximal medical management including strict control of blood pressure, blood glucose, and lipid levels, obtaining regular exercise, and cessation of  smoking.  The patient is aware that without maximal medical management the underlying atherosclerotic disease process will progress, limiting the benefit of any interventions. The patient is currently on a statin:  Crestor. The patient is currently on an anti-platelet: ASA.  Thank you for allowing [place] to participate in this patient's care.   Adele Barthel, MD, FACS [facility] Office: [phone] Pager: [phone]

## 2016-01-25 NOTE — Progress Notes (Signed)
CARDIOLOGY OFFICE NOTE  Date:  01/25/2016    [name] Date of Birth: [birth year] Medical Record #[id number]  PCP:  Tula Nakayama  Cardiologist:  Servando Snare Nahser  Chief Complaint  Patient presents with  . Coronary Artery Disease    Follow up visit - seen for Dr. Acie Fredrickson    History of Present Illness: Ryan Copeland is a 65 y.o. male who presents today for a 2 month check. Seen for Dr. Acie Fredrickson. Former patient of Dr. Susa Simmonds.   Has known prior NSTEMI and stenting of the LAD in 2011. Underwent repeat cath due to refractory symptoms back in August of 2016 - managed medically. Other issues include tobacco abuse, PVD with past iliac stenting, HTN, HLD, DM, chronic thrombocytopenia and CLL .  Seen back in September - had gained weight. Some intermittent chest heaviness - no response with NTG but I added Imdur to his regimen. Last seen back in October - still with atypical chest pain - felt more like a pulled muscle but no relief with NSAID. He thought the Imdur had helped however. I talked with Dr. Acie Fredrickson - wanted to avoid Myoview - most likely it was felt to turn out abnormal. Elected to just follow for now.   Comes back today. Here alone. Says he is doing ok. Has had a CT scan of his chest by oncology. This turned out to be negative but review by Dr. Alvy Bimler showed thickened esophageal wall. Has been referred to GI - for EGD. Trying to get a visit with Dr. Benson Norway. Remains on isosorbide. Still smoking some. Wanting more patches. Says he has "made up his mind and this is it". Has had several close friends/family pass with cancer. He hopes to get off the Imdur after evaluation with GI.   Past Medical History:  Diagnosis Date  . Anxiety 01/30/2014  . Back injury    lower disc  . CAD (coronary artery disease)   . CLL (chronic lymphocytic leukemia) ([place]) 03/18/2011  . Diabetes mellitus ([place])    Type 2   . ECRB (extensor carpi radialis brevis) tenosynovitis   . GERD  (gastroesophageal reflux disease)    takes Nexium if needed  . Hyperlipidemia   . Lateral epicondylitis of left elbow   . MI, acute, non ST segment elevation ([place]) 06/28/2009   with stenting of the LAD  . Neuromuscular disorder ([place])    peripheral neuropathy  . PVD (peripheral vascular disease) ([place])   . Tobacco abuse     Past Surgical History:  Procedure Laterality Date  . ADENOIDECTOMY  1955  . CARDIAC CATHETERIZATION    . CARDIAC CATHETERIZATION N/A 09/26/2014   Procedure: Left Heart Cath and Coronary Angiography;  Surgeon: Peter M Martinique, MD;  Location: [facility];  Service: Cardiovascular;  Laterality: N/A;  . carpel tunnel release Left 04-1989  . carpel tunnel release  Right 01-1989  . CHOLECYSTECTOMY  2007  . CORONARY STENT PLACEMENT  May 2011  . femoral stents    . LATERAL EPICONDYLE RELEASE Left 02/12/2014   Procedure: LEFT ELBOW DEBRIDEMENT WITH TENDON REPAIR ;  Surgeon: Lorn Junes, MD;  Location: [facility];  Service: Orthopedics;  Laterality: Left;  . LEFT CAI STENT/PTA AND POPLITEAL ARTERY/TIBIAL THROMBECTOMY     . LEFT HEART CATHETERIZATION WITH CORONARY ANGIOGRAM N/A 08/26/2011   Procedure: LEFT HEART CATHETERIZATION WITH CORONARY ANGIOGRAM;  Surgeon: Peter M Martinique, MD;  Location: [facility] CATH [facility];  Service: Cardiovascular;  Laterality: N/A;  .  PERIPHERAL VASCULAR CATHETERIZATION N/A 01/01/2015   Procedure: Abdominal Aortogram;  Surgeon: Conrad Wimbledon, MD;  Location: [facility];  Service: Cardiovascular;  Laterality: N/A;  . TARSAL TUNNEL RELEASE Bilateral 08-2007     Medications: Current Outpatient Prescriptions  Medication Sig Dispense Refill  . acetaminophen (TYLENOL) 500 MG tablet Take 500 mg by mouth every 6 (six) hours as needed for mild pain, moderate pain or headache.    Marland Kitchen acyclovir (ZOVIRAX) 400 MG tablet Take 1 tablet (400 mg total) by mouth daily. 90 tablet 3  . ALPRAZolam (XANAX) 0.25 MG tablet Take 1 po q 6 hours prn for anxiety (Patient  taking differently: Take 0.25 mg by mouth 2 (two) times daily as needed for anxiety. ) 60 tablet 0  . aspirin 325 MG tablet Take 325 mg by mouth daily.      . benzonatate (TESSALON) 100 MG capsule One to two pills three times per day for cough    . COMBIVENT RESPIMAT 20-100 MCG/ACT AERS respimat 1 puff every 6 (six) hours as needed for wheezing or shortness of breath.   0  . cyanocobalamin (,VITAMIN B-12,) 1000 MCG/ML injection Inject 1 mL (1,000 mcg total) into the muscle every 30 (thirty) days. 10 mL 1  . fluticasone (FLOVENT HFA) 110 MCG/ACT inhaler Inhale 1 puff into the lungs as needed. (Patient taking differently: Inhale 1 puff into the lungs as needed (shortness of breath). ) 1 Inhaler 1  . HYDROcodone-acetaminophen (NORCO) 10-325 MG tablet take 1 tablet by mouth every 6 to 8 hours if needed for pain  0  . lidocaine-prilocaine (EMLA) cream apply to affected area topically if needed 30 g 3  . meclizine (ANTIVERT) 12.5 MG tablet Take 12.5 mg by mouth every 6 (six) hours as needed for dizziness.   0  . metFORMIN (GLUCOPHAGE-XR) 500 MG 24 hr tablet Take 500 mg by mouth 2 (two) times daily.    . metoprolol succinate (TOPROL XL) 25 MG 24 hr tablet Take 1 tablet (25 mg total) by mouth daily. 90 tablet 3  . NICODERM CQ 21 MG/24HR patch Place 1 patch (21 mg total) onto the skin daily. 30 patch 0  . nitroGLYCERIN (NITROSTAT) 0.4 MG SL tablet place 1 tablet under the tongue if needed every 5 minutes for chest pain 25 tablet 3  . omeprazole (PRILOSEC) 40 MG capsule Reported on 08/21/2015  0  . pregabalin (LYRICA) 75 MG capsule 1 cap three times daily and 2 caps at bedtime 450 capsule 1  . rosuvastatin (CRESTOR) 5 MG tablet take 1 tablet by mouth once daily 90 tablet 3  . tetrahydrozoline 0.05 % ophthalmic solution Place 1 drop into both eyes as needed. Red eyes    . topiramate (TOPAMAX) 25 MG tablet 3 tabs twice daily if necessary 540 tablet 2  . zolpidem (AMBIEN) 10 MG tablet Take 10 mg by mouth at  bedtime as needed for sleep.   0  . isosorbide mononitrate (IMDUR) 30 MG 24 hr tablet Take 0.5 tablets (15 mg total) by mouth daily. 45 tablet 3  . nicotine (NICODERM CQ) 14 mg/24hr patch Place 1 patch (14 mg total) onto the skin daily. 28 patch 0  . nicotine (NICODERM CQ) 7 mg/24hr patch Place 1 patch (7 mg total) onto the skin daily. 28 patch 0   No current facility-administered medications for this visit.    Facility-Administered Medications Ordered in Other Visits  Medication Dose Route Frequency Provider Last Rate Last Dose  . 0.9 %  sodium chloride infusion   Intravenous Continuous [name], MD 50 mL/hr at 03/07/14 1005    . acetaminophen (TYLENOL) tablet 650 mg  650 mg Oral Q6H PRN [name], MD   650 mg at 03/07/14 1012  . sodium chloride 0.9 % injection 10 mL  10 mL Intracatheter PRN Marcy Panning, MD   10 mL at 08/22/12 1721  . sodium chloride 0.9 % injection 10 mL  10 mL Intravenous PRN Marcy Panning, MD   10 mL at 05/29/13 1323    Allergies: Allergies  Allergen Reactions  . Codeine Hives    Pt states he can take a few, more reaction with extended doses.    Social History: The patient  reports that he has been smoking Cigarettes.  He has a 22.00 pack-year smoking history. He has quit using smokeless tobacco. He reports that he does not drink alcohol or use drugs.   Family History: The patient's family history includes Cancer in his mother; Heart disease in his father; Heart failure (age of onset: 57) in his father; Lung cancer (age of onset: 49) in his mother.   Review of Systems: Please see the history of present illness.   Otherwise, the review of systems is positive for none.   All other systems are reviewed and negative.   Physical Exam: VS:  BP 104/68   Pulse 63   Ht 6' (1.829 m)   Wt 227 lb 12.8 oz (103.3 kg)   SpO2 96% Comment: at rest  BMI 30.90 kg/m  .  BMI Body mass index is 30.9 kg/m.  Wt Readings from Last 3 Encounters:  01/25/16 227 lb 12.8 oz (103.3  kg)  01/22/16 231 lb 3 oz (104.9 kg)  01/15/16 229 lb 14.4 oz (104.3 kg)    General: Pleasant. Well developed, well nourished and in no acute distress. Weight is down 4 pounds.    HEENT: Normal.  Neck: Supple, no JVD, carotid bruits, or masses noted.  Cardiac: Regular rate and rhythm. No murmurs, rubs, or gallops. No edema.  Respiratory:  Lungs are clear to auscultation bilaterally with normal work of breathing.  GI: Soft and nontender.  MS: No deformity or atrophy. Gait and ROM intact.  Skin: Warm and dry. Color is normal.  Neuro:  Strength and sensation are intact and no gross focal deficits noted.  Psych: Alert, appropriate and with normal affect.   LABORATORY DATA:  EKG:  EKG is not ordered today.   Lab Results  Component Value Date   WBC 5.8 01/20/2016   HGB 15.7 01/20/2016   HCT 46.9 01/20/2016   PLT 79 (L) 01/20/2016   GLUCOSE 221 (H) 01/20/2016   CHOL 151 10/30/2015   TRIG 123 10/30/2015   HDL 43 10/30/2015   LDLDIRECT 117.6 08/22/2011   LDLCALC 83 10/30/2015   ALT 19 01/20/2016   AST 14 01/20/2016   NA 139 01/20/2016   K 4.5 01/20/2016   CL 109 10/30/2015   CREATININE 1.1 01/20/2016   BUN 14.6 01/20/2016   CO2 23 01/20/2016   TSH 2.00 08/15/2014   INR 1.0 09/22/2014   HGBA1C 7.2 (H) 10/30/2015    BNP (last 3 results) No results for input(s): BNP in the last 8760 hours.  ProBNP (last 3 results) No results for input(s): PROBNP in the last 8760 hours.   Other Studies Reviewed Today:  Coronary angiography from 09/2014:  Coronary dominance: right  Left mainstem: Normal.  Left anterior descending (LAD): The stent in the proximal LAD is  widely patent throughout. There is moderate 20% narrowing in the LAD proximal to the stent. The remainder of the vessels without significant disease.  Left circumflex (LCx): The left circumflex gives rise to 2 large marginal branches. There is 20% narrowing prior to the takeoff of the first OM.  Right coronary artery  (RCA): The right coronary is a dominant vessel. It has diffuse 70% disease in the proximal to mid vessel. Is occluded at the crux. There are excellent right to right and left to right collaterals.  Left ventriculography: Left ventricular systolic function is normal, LVEF is estimated at 55-65%, there is no significant mitral regurgitation  Final Conclusions:  1. Single vessel obstructive coronary disease. 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 The stent in the proximal LAD is widely patent.  2. Normal LV function.  Recommendations: Continue medical management.  Peter Martinique  08/26/2011, 9:09 AM   Assessment / Plan: 1. CAD - last cath with stable findings back in August of 2016. He has a chronic total occlusion of the RCA with good collateral flow - stent in the proximal LAD was patent. He was felt to be best served with medical management. Would like to hold on Myoview - felt this would most likely be abnormal due to the total RCA occlusion. Discussed with Dr. Acie Fredrickson at his last visit and we agreed to continue with his current medical management. Now with plans to see GI for EGD. He will let me know how this turns out.   2. HTN - BP good on his current therapy.   3. HLD - on statin therapy  4. ED - no longer on Viagra for now.  Could restart if he was to be able to stop nitrate therapy. Will see how his GI visit goes.   5. Tobacco abuse - continues to struggle with stopping. Does seem more motivated today. Gave him RX for nicoderm patches.   6. CLL/chronic thrombocytopenia - followed by oncology/hematology.   7. NIDDM   8. Obesity  Current medicines are reviewed with the patient today.  The patient does not have concerns regarding medicines other than what has been noted above.  The following changes have been made:  See above.  Labs/ tests ordered today include:   No orders of the defined types were placed in this  encounter.    Disposition:   FU with me in 4 months.    Patient is agreeable to this plan and will call if any problems develop in the interim.   Signed: Burtis Junes, RN, ANP-C 01/25/2016 9:29 AM  [facility] [address] [place], [place]  [address] Phone: [phone] Fax: [phone]

## 2016-01-25 NOTE — Patient Instructions (Addendum)
We will be checking the following labs today - NONE   Medication Instructions:    Continue with your current medicines.   I refilled the Imdur  I refilled the Nicoderm patches for you today.     Testing/Procedures To Be Arranged:  N/A  Follow-Up:   See me in 4 months  Call me with an update from Dr. Benson Norway    Other Special Instructions:   N/A     If you need a refill on your cardiac medications before your next appointment, please call your pharmacy.   Call the [place] office at [phone] if you have any questions, problems or concerns.

## 2016-01-29 ENCOUNTER — Encounter: Payer: Self-pay | Admitting: Vascular Surgery

## 2016-01-29 ENCOUNTER — Ambulatory Visit (INDEPENDENT_AMBULATORY_CARE_PROVIDER_SITE_OTHER): Payer: BLUE CROSS/BLUE SHIELD | Admitting: Vascular Surgery

## 2016-01-29 ENCOUNTER — Ambulatory Visit (HOSPITAL_COMMUNITY)
Admission: RE | Admit: 2016-01-29 | Discharge: 2016-01-29 | Disposition: A | Discharge status: Home / Self Care | Payer: BLUE CROSS/BLUE SHIELD | Source: Ambulatory Visit | Attending: Vascular Surgery | Admitting: Vascular Surgery

## 2016-01-29 ENCOUNTER — Ambulatory Visit (INDEPENDENT_AMBULATORY_CARE_PROVIDER_SITE_OTHER)
Admission: RE | Admit: 2016-01-29 | Discharge: 2016-01-29 | Disposition: A | Discharge status: Home / Self Care | Payer: BLUE CROSS/BLUE SHIELD | Source: Ambulatory Visit | Attending: Vascular Surgery | Admitting: Vascular Surgery

## 2016-01-29 VITALS — BP 129/68 | HR 62 | Temp 97.0°F | Resp 18 | Ht 72.0 in | Wt 227.0 lb

## 2016-01-29 DIAGNOSIS — I70213 Atherosclerosis of native arteries of extremities with intermittent claudication, bilateral legs: Secondary | ICD-10-CM

## 2016-01-29 DIAGNOSIS — I70212 Atherosclerosis of native arteries of extremities with intermittent claudication, left leg: Secondary | ICD-10-CM | POA: Insufficient documentation

## 2016-02-01 ENCOUNTER — Other Ambulatory Visit: Payer: Self-pay | Admitting: Nurse Practitioner

## 2016-02-02 MED ORDER — PREGABALIN 75 MG PO CAPS: ORAL_CAPSULE | ORAL | 1 refills | Status: DC

## 2016-02-02 NOTE — Addendum Note (Signed)
Addended by: Lianne Cure A on: 02/02/2016 12:17 PM   Modules accepted: Orders

## 2016-02-10 ENCOUNTER — Encounter: Payer: Self-pay | Admitting: Nurse Practitioner

## 2016-02-11 ENCOUNTER — Ambulatory Visit (INDEPENDENT_AMBULATORY_CARE_PROVIDER_SITE_OTHER): Payer: BLUE CROSS/BLUE SHIELD | Admitting: Nurse Practitioner

## 2016-02-11 ENCOUNTER — Encounter: Payer: Self-pay | Admitting: Nurse Practitioner

## 2016-02-11 VITALS — BP 111/60 | HR 61 | Ht 72.0 in | Wt 232.4 lb

## 2016-02-11 DIAGNOSIS — G619 Inflammatory polyneuropathy, unspecified: Secondary | ICD-10-CM

## 2016-02-11 DIAGNOSIS — G622 Polyneuropathy due to other toxic agents: Secondary | ICD-10-CM | POA: Diagnosis not present

## 2016-02-11 DIAGNOSIS — G609 Hereditary and idiopathic neuropathy, unspecified: Secondary | ICD-10-CM

## 2016-02-11 MED ORDER — L-METHYLFOLATE-B6-B12 3-35-2 MG PO TABS: 1.0000 | ORAL_TABLET | Freq: Two times a day (BID) | ORAL | 2 refills | Status: DC

## 2016-02-11 MED ORDER — PREGABALIN 100 MG PO CAPS: ORAL_CAPSULE | ORAL | 1 refills | Status: DC

## 2016-02-11 NOTE — Patient Instructions (Addendum)
Increase Lyrica to '100mg'$   3 times daily and 2 at bedtime total dose 500 Metanx 1 Twice daily Continue Topamax 25 mg 3 tabs twice daily Continue vitamin B 12 injections Follow-up in 8 months

## 2016-02-11 NOTE — Progress Notes (Signed)
Fax confirmation received [facility] [phone].

## 2016-02-11 NOTE — Progress Notes (Signed)
I agree with the assessment and plan as directed by NP .   Diannia Hogenson, MD  

## 2016-02-11 NOTE — Progress Notes (Signed)
[facility]  PATIENT: Ryan Copeland DOB: [birth year]   REASON FOR VISIT: Follow-up for peripheral neuropathy HISTORY FROM: Patient    HISTORY OF PRESENT ILLNESS:UPDATE 02/11/2016 Ryan Copeland, 65 year old male returns for follow-up with history of polyneuropathy which has been long-standing. He continues to have some pain in both feet left worse than the right. Gabapentin was not beneficial for his symptoms. He remains on vitamin B12 injections. He is currently on Lyrica 75 times a day 150 at night. He has failed Cymbalta. He is also on Topamax 75 twice daily. He continues to work full-time. His walking and balance are unchanged. No recent falls.  Epidural injection by Dr. Brien Copeland last month. 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 He returns for reevaluation  08/21/15 [name], 65 year old male returns for followup. He has a history of polyneuropathy which has been long-standing. He was last seen 01/06/15. He continues to have some pain, numbness and paresthesias in both feet left worse than right. Sensation is worse in great toes. His feet are cold he also has a burning sensation. His symptoms have not worsened since last seen. Gabapentin was not beneficial for his symptoms. He was found to have a low B6 level and an extremely low vitamin B12 level at 121. He has been on vitamin B12 injections which his wife gives monthly. He is sleeping well. He is currently taking Lyrica 75 mg 3 times a day and two 75 mg at night for a total dose of '375mg'$ . He was placed on Transdermal cream last year, with good results. He has failed Cymbalta. He is also on Topamax. He states his walking and balance are unchanged. He has not fallen.  Had lower back disc injury. ( has had epidural steroid injections, surgery in future.He has CLL and has been receiving IVIG. He returns for reevaluation  and refills   REVIEW OF SYSTEMS: Full 14 system review of  systems performed and notable only for those listed, all others are neg:  Constitutional: neg  Cardiovascular: neg Ear/Nose/Throat: neg  Skin: neg Eyes: neg Respiratory: neg Gastroitestinal: neg  Hematology/Lymphatic: neg  Endocrine: neg Musculoskeletal: Back pain , foot pain paresthesias Allergy/Immunology: neg Neurological: neg Psychiatric: neg Sleep : neg   ALLERGIES: Allergies  Allergen Reactions  . Codeine Hives    Pt states he can take a Copeland, more reaction with extended doses.    HOME MEDICATIONS: Outpatient Medications Prior to Visit  Medication Sig Dispense Refill  . acetaminophen (TYLENOL) 500 MG tablet Take 500 mg by mouth every 6 (six) hours as needed for mild pain, moderate pain or headache.    Marland Kitchen acyclovir (ZOVIRAX) 400 MG tablet Take 1 tablet (400 mg total) by mouth daily. 90 tablet 3  . ALPRAZolam (XANAX) 0.25 MG tablet Take 1 po q 6 hours prn for anxiety (Patient taking differently: Take 0.25 mg by mouth 2 (two) times daily as needed for anxiety. ) 60 tablet 0  . aspirin 325 MG tablet Take 325 mg by mouth daily.      . benzonatate (TESSALON) 100 MG capsule One to two pills three times per day for cough    . COMBIVENT RESPIMAT 20-100 MCG/ACT AERS respimat 1 puff every 6 (six) hours as needed for wheezing or shortness of breath.   0  . cyanocobalamin (,VITAMIN B-12,) 1000 MCG/ML injection Inject 1 mL (1,000 mcg total) into the muscle every 30 (thirty) days. 10 mL 1  . fluticasone (  FLOVENT HFA) 110 MCG/ACT inhaler Inhale 1 puff into the lungs as needed. (Patient taking differently: Inhale 1 puff into the lungs as needed (shortness of breath). ) 1 Inhaler 1  . HYDROcodone-acetaminophen (NORCO) 10-325 MG tablet take 1 tablet by mouth every 6 to 8 hours if needed for pain  0  . lidocaine-prilocaine (EMLA) cream apply to affected area topically if needed 30 g 3  . meclizine (ANTIVERT) 12.5 MG tablet Take 12.5 mg by mouth every 6 (six) hours as needed for dizziness.   0    . metFORMIN (GLUCOPHAGE-XR) 500 MG 24 hr tablet Take 500 mg by mouth 2 (two) times daily.    . metoprolol succinate (TOPROL XL) 25 MG 24 hr tablet Take 1 tablet (25 mg total) by mouth daily. 90 tablet 3  . NICODERM CQ 21 MG/24HR patch Place 1 patch (21 mg total) onto the skin daily. 30 patch 0  . nitroGLYCERIN (NITROSTAT) 0.4 MG SL tablet place 1 tablet under the tongue if needed every 5 minutes for chest pain 25 tablet 3  . pregabalin (LYRICA) 75 MG capsule take 1 capsule by mouth three times a day and 2 capsules at bedtime 450 capsule 1  . rosuvastatin (CRESTOR) 5 MG tablet take 1 tablet by mouth once daily 90 tablet 3  . tetrahydrozoline 0.05 % ophthalmic solution Place 1 drop into both eyes as needed. Red eyes    . topiramate (TOPAMAX) 25 MG tablet 3 tabs twice daily if necessary 540 tablet 2  . zolpidem (AMBIEN) 10 MG tablet Take 10 mg by mouth at bedtime as needed for sleep.   0  . nicotine (NICODERM CQ) 14 mg/24hr patch Place 1 patch (14 mg total) onto the skin daily. (Patient not taking: Reported on 02/11/2016) 28 patch 0  . nicotine (NICODERM CQ) 7 mg/24hr patch Place 1 patch (7 mg total) onto the skin daily. (Patient not taking: Reported on 02/11/2016) 28 patch 0  . isosorbide mononitrate (IMDUR) 30 MG 24 hr tablet Take 0.5 tablets (15 mg total) by mouth daily. (Patient not taking: Reported on 02/11/2016) 45 tablet 3  . omeprazole (PRILOSEC) 40 MG capsule Reported on 08/21/2015  0   Facility-Administered Medications Prior to Visit  Medication Dose Route Frequency Provider Last Rate Last Dose  . 0.9 %  sodium chloride infusion   Intravenous Continuous Ryan Lark, MD 50 mL/hr at 03/07/14 1005    . acetaminophen (TYLENOL) tablet 650 mg  650 mg Oral Q6H PRN Ryan Lark, MD   650 mg at 03/07/14 1012  . sodium chloride 0.9 % injection 10 mL  10 mL Intracatheter PRN Ryan Panning, MD   10 mL at 08/22/12 1721  . sodium chloride 0.9 % injection 10 mL  10 mL Intravenous PRN Ryan Panning, MD   10 mL  at 05/29/13 1323    PAST MEDICAL HISTORY: Past Medical History:  Diagnosis Date  . Anxiety 01/30/2014  . Back injury    lower disc  . CAD (coronary artery disease)   . CLL (chronic lymphocytic leukemia) ([place]) 03/18/2011  . Diabetes mellitus ([place])    Type 2   . ECRB (extensor carpi radialis brevis) tenosynovitis   . GERD (gastroesophageal reflux disease)    takes Nexium if needed  . Hyperlipidemia   . Lateral epicondylitis of left elbow   . MI, acute, non ST segment elevation ([place]) 06/28/2009   with stenting of the LAD  . Neuromuscular disorder ([place])    peripheral neuropathy  . PVD (peripheral  vascular disease) ([place])   . Tobacco abuse     PAST SURGICAL HISTORY: Past Surgical History:  Procedure Laterality Date  . ADENOIDECTOMY  1955  . CARDIAC CATHETERIZATION    . CARDIAC CATHETERIZATION N/A 09/26/2014   Procedure: Left Heart Cath and Coronary Angiography;  Surgeon: Peter M Martinique, MD;  Location: [facility];  Service: Cardiovascular;  Laterality: N/A;  . carpel tunnel release Left 04-1989  . carpel tunnel release  Right 01-1989  . CHOLECYSTECTOMY  2007  . CORONARY STENT PLACEMENT  May 2011  . femoral stents    . LATERAL EPICONDYLE RELEASE Left 02/12/2014   Procedure: LEFT ELBOW DEBRIDEMENT WITH TENDON REPAIR ;  Surgeon: Lorn Junes, MD;  Location: [facility];  Service: Orthopedics;  Laterality: Left;  . LEFT CAI STENT/PTA AND POPLITEAL ARTERY/TIBIAL THROMBECTOMY     . LEFT HEART CATHETERIZATION WITH CORONARY ANGIOGRAM N/A 08/26/2011   Procedure: LEFT HEART CATHETERIZATION WITH CORONARY ANGIOGRAM;  Surgeon: Peter M Martinique, MD;  Location: [facility] CATH [facility];  Service: Cardiovascular;  Laterality: N/A;  . PERIPHERAL VASCULAR CATHETERIZATION N/A 01/01/2015   Procedure: Abdominal Aortogram;  Surgeon: Conrad [place]rbana, MD;  Location: [place] [facility];  Service: Cardiovascular;  Laterality: N/A;  . TARSAL TUNNEL RELEASE Bilateral 08-2007    FAMILY HISTORY: Family History  Problem  Relation Age of Onset  . Lung cancer Mother 22  . Cancer Mother     lung  . Heart failure Father 82  . Heart disease Father     SOCIAL HISTORY: Social History   Social History  . Marital status: Married    Spouse name: [name]  . Number of children: 1  . Years of education: College   Occupational History  .  [place] History Main Topics  . Smoking status: Current Every Day Smoker    Packs/day: 0.50    Years: 44.00    Types: Cigarettes  . Smokeless tobacco: Former Systems developer  . Alcohol use No     Comment:  drinks non-alcoholic beer  . Drug use: No  . Sexual activity: Yes   Other Topics Concern  . Not on file   Social History Narrative   Patient lives at home with wife.   Caffeine Use: 15 cups of caffeine weekly     PHYSICAL EXAM  Vitals:   02/11/16 1034  BP: 111/60  Pulse: 61  Weight: 232 lb 6.4 oz (105.4 kg)  Height: 6' (1.829 m)   Body mass index is 31.52 kg/m. Generalized: Well developed, in no acute distress  Head: normocephalic and atraumatic,. Oropharynx benign  Neck: Supple, no carotid bruits  Cardiac: Regular rate rhythm, no murmur  Musculoskeletal: No deformity   Neurological examination   Mentation: Alert oriented to time, place, history taking. Follows all commands speech and language fluent  Cranial nerve II-XII: Pupils were equal round reactive to light extraocular movements were full, visual field were full on confrontational test. Facial sensation and strength were normal. hearing was intact to finger rubbing bilaterally. Uvula tongue midline. head turning and shoulder shrug were normal and symmetric.Tongue protrusion into cheek strength was normal. Motor: normal bulk and tone, full strength in the BUE, BLE, fine finger movements normal, no pronator drift. No focal weakness Sensory: Diminished pinprick to the shins bilaterally, decreased vibratory to the toes, position sense is normal  Coordination:  finger-nose-finger, heel-to-shin bilaterally, no dysmetria Reflexes: Diminished upper and lower and symmetric, plantar responses were flexor bilaterally. Gait  and Station: Rising up from seated position without assistance, normal stance, moderate stride, good arm swing, smooth turning, able to perform tiptoe, and heel walking without difficulty. Tandem gait is mildly unsteady. No assistive device  DIAGNOSTIC DATA (LABS, IMAGING, TESTING) - I reviewed patient records, labs, notes, testing and imaging myself where available.  Lab Results  Component Value Date   WBC 5.8 01/20/2016   HGB 15.7 01/20/2016   HCT 46.9 01/20/2016   MCV 96.0 01/20/2016   PLT 79 (L) 01/20/2016      Component Value Date/Time   NA 139 01/20/2016 1135   K 4.5 01/20/2016 1135   CL 109 10/30/2015 0842   CL 104 08/03/2012 1229   CO2 23 01/20/2016 1135   GLUCOSE 221 (H) 01/20/2016 1135   GLUCOSE 223 (H) 08/03/2012 1229   BUN 14.6 01/20/2016 1135   CREATININE 1.1 01/20/2016 1135   CALCIUM 9.3 01/20/2016 1135   PROT 7.3 01/20/2016 1135   ALBUMIN 3.7 01/20/2016 1135   AST 14 01/20/2016 1135   ALT 19 01/20/2016 1135   ALKPHOS 88 01/20/2016 1135   BILITOT 0.69 01/20/2016 1135   GFRNONAA >90 02/03/2014 1355   GFRAA >90 02/03/2014 1355   Lab Results  Component Value Date   CHOL 151 10/30/2015   HDL 43 10/30/2015   LDLCALC 83 10/30/2015   LDLDIRECT 117.6 08/22/2011   TRIG 123 10/30/2015   CHOLHDL 3.5 10/30/2015   Lab Results  Component Value Date   HGBA1C 7.2 (H) 10/30/2015        ASSESSMENT AND PLAN 65 y.o. year old male has a past medical history of Tobacco abuse; PVD (peripheral vascular disease) ([place]); Diabetes mellitus ([place]); Neuromuscular disorder ([place]); CLL (chronic lymphocytic leukemia) ([place]) (03/18/2011); and peripheral neuropathy here to follow-up. Reviewed labs from 01/30/16. The patient is a current patient of Dr. Krista Blue  who is out of the office today . This note is sent to the work in  doctor.     PLAN: Increase Lyrica to '100mg'$   3 times daily and 2 at bedtime total dose 500 Metanx 1 Twice daily Continue Topamax 25 mg 3 tabs twice daily Continue vitamin B 12 injections Follow-up in 8 months Dennie Bible, [facility], [facility], APRN  [facility] [address], [place], [place] [address] [phone]

## 2016-02-12 ENCOUNTER — Ambulatory Visit (HOSPITAL_BASED_OUTPATIENT_CLINIC_OR_DEPARTMENT_OTHER): Payer: BLUE CROSS/BLUE SHIELD

## 2016-02-12 ENCOUNTER — Other Ambulatory Visit (HOSPITAL_BASED_OUTPATIENT_CLINIC_OR_DEPARTMENT_OTHER): Payer: BLUE CROSS/BLUE SHIELD

## 2016-02-12 VITALS — BP 119/62 | HR 54 | Temp 98.2°F | Resp 16

## 2016-02-12 DIAGNOSIS — C9110 Chronic lymphocytic leukemia of B-cell type not having achieved remission: Secondary | ICD-10-CM

## 2016-02-12 DIAGNOSIS — D801 Nonfamilial hypogammaglobulinemia: Secondary | ICD-10-CM

## 2016-02-12 LAB — CBC WITH DIFFERENTIAL/PLATELET
BASO%: 0.3 % (ref 0.0–2.0)
Basophils Absolute: 0 10*3/uL (ref 0.0–0.1)
EOS ABS: 0 10*3/uL (ref 0.0–0.5)
EOS%: 0.1 % (ref 0.0–7.0)
HCT: 45.2 % (ref 38.4–49.9)
HGB: 15.7 g/dL (ref 13.0–17.1)
LYMPH%: 22.9 % (ref 14.0–49.0)
MCH: 33.2 pg (ref 27.2–33.4)
MCHC: 34.7 g/dL (ref 32.0–36.0)
MCV: 95.6 fL (ref 79.3–98.0)
MONO#: 0.6 10*3/uL (ref 0.1–0.9)
MONO%: 8.3 % (ref 0.0–14.0)
NEUT#: 5.2 10*3/uL (ref 1.5–6.5)
NEUT%: 68.4 % (ref 39.0–75.0)
PLATELETS: 74 10*3/uL — AB (ref 140–400)
RBC: 4.73 10*6/uL (ref 4.20–5.82)
RDW: 14.7 % — ABNORMAL HIGH (ref 11.0–14.6)
WBC: 7.5 10*3/uL (ref 4.0–10.3)
lymph#: 1.7 10*3/uL (ref 0.9–3.3)

## 2016-02-12 MED ORDER — ACETAMINOPHEN 325 MG PO TABS
650.0000 mg | ORAL_TABLET | Freq: Four times a day (QID) | ORAL | Status: DC | PRN
  Administered 2016-02-12: 650 mg via ORAL

## 2016-02-12 MED ORDER — DIPHENHYDRAMINE HCL 25 MG PO TABS
25.0000 mg | ORAL_TABLET | Freq: Once | ORAL | Status: AC
  Administered 2016-02-12: 25 mg via ORAL
  Filled 2016-02-12: qty 1

## 2016-02-12 MED ORDER — ACETAMINOPHEN 325 MG PO TABS
ORAL_TABLET | ORAL | Status: AC
  Filled 2016-02-12: qty 2

## 2016-02-12 MED ORDER — DIPHENHYDRAMINE HCL 25 MG PO CAPS
ORAL_CAPSULE | ORAL | Status: AC
  Filled 2016-02-12: qty 1

## 2016-02-12 MED ORDER — IMMUNE GLOBULIN (HUMAN) 10 GM/100ML IV SOLN
0.5000 g/kg | Freq: Once | INTRAVENOUS | Status: AC
  Administered 2016-02-12: 55 g via INTRAVENOUS
  Filled 2016-02-12: qty 400

## 2016-02-12 MED ORDER — DEXTROSE 5 % IV SOLN
Freq: Once | INTRAVENOUS | Status: AC
  Administered 2016-02-12: 10:00:00 via INTRAVENOUS

## 2016-02-12 NOTE — Patient Instructions (Signed)

## 2016-02-19 ENCOUNTER — Ambulatory Visit: Payer: BLUE CROSS/BLUE SHIELD | Admitting: Nurse Practitioner

## 2016-03-10 ENCOUNTER — Telehealth: Payer: Self-pay | Admitting: *Deleted

## 2016-03-10 NOTE — Telephone Encounter (Signed)
Patient called and cancelled his appointment for IVIG tomorrow as he has been called out of town.  Patient states he will call back to reschedule.  Michelle Wheat notified to cancel in Infusion.

## 2016-03-11 ENCOUNTER — Other Ambulatory Visit: Payer: BLUE CROSS/BLUE SHIELD

## 2016-03-11 ENCOUNTER — Ambulatory Visit: Payer: BLUE CROSS/BLUE SHIELD

## 2016-03-25 ENCOUNTER — Encounter: Payer: Self-pay | Admitting: Hematology and Oncology

## 2016-04-08 ENCOUNTER — Other Ambulatory Visit: Payer: BLUE CROSS/BLUE SHIELD

## 2016-04-08 ENCOUNTER — Ambulatory Visit: Payer: BLUE CROSS/BLUE SHIELD

## 2016-05-06 ENCOUNTER — Other Ambulatory Visit: Payer: BLUE CROSS/BLUE SHIELD

## 2016-05-06 ENCOUNTER — Ambulatory Visit: Payer: BLUE CROSS/BLUE SHIELD

## 2016-05-11 ENCOUNTER — Encounter: Payer: Self-pay | Admitting: Nurse Practitioner

## 2016-05-23 NOTE — Progress Notes (Signed)
CARDIOLOGY OFFICE NOTE  Date:  05/24/2016    Ryan Copeland Date of Birth: 05/21/1950 Medical Record #[id number]  PCP:  Tula Nakayama  Cardiologist:  Servando Snare Nahser    Chief Complaint  Patient presents with  . Coronary Artery Disease    4 month check - seen for Dr. Acie Fredrickson    History of Present Illness: Ryan Copeland is a 66 y.o. male who presents today for a 4 month check. Seen for Dr. Acie Fredrickson. Former patient of Dr. Susa Simmonds.   Has known prior NSTEMI and stenting of the LAD in 2011. Underwent repeat cath due to refractory symptoms back in August of 2016 - managed medically. Other issues include tobacco abuse, PVD with past iliac stenting, HTN, HLD, DM, chronic thrombocytopenia and CLL .  Seen back in September - had gained weight. Some intermittent chest heaviness - no response with NTG but I added Imdur to his regimen. Last seen back in October - still with atypical chest pain - felt more like a pulled muscle but no relief with NSAID. He thought the Imdur had helped however. I talked with Dr. Acie Fredrickson - wanted to avoid Myoview - most likely it was felt to turn out abnormal. Elected to just follow for now. I last saw him back in December - he was having GI work up - this was felt to be possibly causing some of his prior chest pain - we discussed strategy to stop Imdur so he could resume Viagra. Still struggling with trying to stop smoking.   Comes back today. Here alone. He has been treated for a yeast infection in the GI tract - this has been treated. No more chest pain. He has been able to stop Imdur. He has not used Viagra yet. He feels like he is doing well. He really has no concerns. Needs meds refilled due to changing drug stores. He has done better with smoking cessation - few relapses - down to the Nicoderm 7 patch.   Past Medical History:  Diagnosis Date  . Anxiety 01/30/2014  . Back injury    lower disc  . CAD (coronary artery disease)   . CLL (chronic  lymphocytic leukemia) ([place]) 03/18/2011  . Diabetes mellitus ([place])    Type 2   . ECRB (extensor carpi radialis brevis) tenosynovitis   . GERD (gastroesophageal reflux disease)    takes Nexium if needed  . Hyperlipidemia   . Lateral epicondylitis of left elbow   . MI, acute, non ST segment elevation ([place]) 06/28/2009   with stenting of the LAD  . Neuromuscular disorder ([place])    peripheral neuropathy  . PVD (peripheral vascular disease) ([place])   . Tobacco abuse     Past Surgical History:  Procedure Laterality Date  . ADENOIDECTOMY  1955  . CARDIAC CATHETERIZATION    . CARDIAC CATHETERIZATION N/A 09/26/2014   Procedure: Left Heart Cath and Coronary Angiography;  Surgeon: Peter M Martinique, MD;  Location: [facility];  Service: Cardiovascular;  Laterality: N/A;  . carpel tunnel release Left 04-1989  . carpel tunnel release  Right 01-1989  . CHOLECYSTECTOMY  2007  . CORONARY STENT PLACEMENT  May 2011  . femoral stents    . LATERAL EPICONDYLE RELEASE Left 02/12/2014   Procedure: LEFT ELBOW DEBRIDEMENT WITH TENDON REPAIR ;  Surgeon: Lorn Junes, MD;  Location: [facility];  Service: Orthopedics;  Laterality: Left;  . LEFT CAI STENT/PTA AND POPLITEAL ARTERY/TIBIAL THROMBECTOMY     .  LEFT HEART CATHETERIZATION WITH CORONARY ANGIOGRAM N/A 08/26/2011   Procedure: LEFT HEART CATHETERIZATION WITH CORONARY ANGIOGRAM;  Surgeon: Peter M Martinique, MD;  Location: [facility] C[facility];  Service: Cardiovascular;  Laterality: N/A;  . PERIPHERAL VASCULAR CATHETERIZATION N/A 01/01/2015   Procedure: Abdominal Aortogram;  Surgeon: Conrad [place], MD;  Location: [facility];  Service: Cardiovascular;  Laterality: N/A;  . TARSAL TUNNEL RELEASE Bilateral 08-2007     Medications: Current Outpatient Prescriptions  Medication Sig Dispense Refill  . acetaminophen (TYLENOL) 500 MG tablet Take 500 mg by mouth every 6 (six) hours as needed for mild pain, moderate pain or headache.    Marland Kitchen acyclovir (ZOVIRAX) 400 MG tablet  Take 1 tablet (400 mg total) by mouth daily. 90 tablet 3  . ALPRAZolam (XANAX) 0.25 MG tablet Take 0.25 mg by mouth as needed for anxiety.    Marland Kitchen aspirin 325 MG tablet Take 325 mg by mouth daily.      . benzonatate (TESSALON) 100 MG capsule One to two pills three times per day for cough    . cyanocobalamin (,VITAMIN B-12,) 1000 MCG/ML injection Inject 1 mL (1,000 mcg total) into the muscle every 30 (thirty) days. 10 mL 1  . esomeprazole (NEXIUM) 40 MG capsule Take 40 mg by mouth 2 (two) times daily.  0  . HYDROcodone-acetaminophen (NORCO) 10-325 MG tablet take 1 tablet by mouth every 6 to 8 hours if needed for pain  0  . lidocaine (LIDODERM) 5 % apply patch to affected area once daily as directed for lower back pain  0  . lidocaine-prilocaine (EMLA) cream apply to affected area topically if needed 30 g 3  . meclizine (ANTIVERT) 12.5 MG tablet Take 12.5 mg by mouth every 6 (six) hours as needed for dizziness.   0  . metFORMIN (GLUCOPHAGE-XR) 500 MG 24 hr tablet Take 500 mg by mouth 2 (two) times daily.    . metoprolol succinate (TOPROL XL) 25 MG 24 hr tablet Take 1 tablet (25 mg total) by mouth daily. 90 tablet 3  . nicotine (NICODERM CQ) 7 mg/24hr patch Place 1 patch (7 mg total) onto the skin daily. 28 patch 0  . nitroGLYCERIN (NITROSTAT) 0.4 MG SL tablet place 1 tablet under the tongue if needed every 5 minutes for chest pain 25 tablet 3  . pregabalin (LYRICA) 100 MG capsule 1 cap three times daily and 2 caps at hs 450 capsule 1  . rosuvastatin (CRESTOR) 5 MG tablet Take 1 tablet (5 mg total) by mouth daily. 90 tablet 3  . tetrahydrozoline 0.05 % ophthalmic solution Place 1 drop into both eyes as needed. Red eyes    . topiramate (TOPAMAX) 25 MG tablet 3 tabs twice daily if necessary 540 tablet 2  . zolpidem (AMBIEN) 10 MG tablet Take 10 mg by mouth at bedtime as needed for sleep.   0  . fluconazole (DIFLUCAN) 200 MG tablet take 2 tablets by mouth on day 1 then 1 tablet until finished  0   No  current facility-administered medications for this visit.    Facility-Administered Medications Ordered in Other Visits  Medication Dose Route Frequency Provider Last Rate Last Dose  . 0.9 %  sodium chloride infusion   Intravenous Continuous [name], MD 50 mL/hr at 03/07/14 1005    . acetaminophen (TYLENOL) tablet 650 mg  650 mg Oral Q6H PRN [name], MD   650 mg at 03/07/14 1012  . sodium chloride 0.9 % injection 10 mL  10 mL  Intracatheter PRN Marcy Panning, MD   10 mL at 08/22/12 1721  . sodium chloride 0.9 % injection 10 mL  10 mL Intravenous PRN Marcy Panning, MD   10 mL at 05/29/13 1323    Allergies: Allergies  Allergen Reactions  . Codeine Hives    Pt states he can take a few, more reaction with extended doses.    Social History: The patient  reports that he quit smoking about 4 weeks ago. His smoking use included Cigarettes. He has a 22.00 pack-year smoking history. He has quit using smokeless tobacco. He reports that he does not drink alcohol or use drugs.   Family History: The patient's family history includes Cancer in his mother; Heart disease in his father; Heart failure (age of onset: 43) in his father; Lung cancer (age of onset: 18) in his mother.   Review of Systems: Please see the history of present illness.   Otherwise, the review of systems is positive for none.   All other systems are reviewed and negative.   Physical Exam: VS:  BP 120/80   Pulse (!) 59   Ht 6' (1.829 m)   Wt 235 lb 12.8 oz (107 kg)   SpO2 97% Comment: at rest  BMI 31.98 kg/m  .  BMI Body mass index is 31.98 kg/m.  Wt Readings from Last 3 Encounters:  05/24/16 235 lb 12.8 oz (107 kg)  02/11/16 232 lb 6.4 oz (105.4 kg)  01/29/16 227 lb (103 kg)    General: Pleasant. Well developed, well nourished and in no acute distress. He has on very heavy shoes.   HEENT: Normal.  Neck: Supple, no JVD, carotid bruits, or masses noted.  Cardiac: Regular rate and rhythm. No murmurs, rubs, or gallops.  No edema.  Respiratory:  Lungs are clear to auscultation bilaterally with normal work of breathing.  GI: Soft and nontender.  MS: No deformity or atrophy. Gait and ROM intact.  Skin: Warm and dry. Color is normal.  Neuro:  Strength and sensation are intact and no gross focal deficits noted.  Psych: Alert, appropriate and with normal affect.   LABORATORY DATA:  EKG:  EKG is not ordered today.   Lab Results  Component Value Date   WBC 7.5 02/12/2016   HGB 15.7 02/12/2016   HCT 45.2 02/12/2016   PLT 74 (L) 02/12/2016   GLUCOSE 221 (H) 01/20/2016   CHOL 151 10/30/2015   TRIG 123 10/30/2015   HDL 43 10/30/2015   LDLDIRECT 117.6 08/22/2011   LDLCALC 83 10/30/2015   ALT 19 01/20/2016   AST 14 01/20/2016   NA 139 01/20/2016   K 4.5 01/20/2016   CL 109 10/30/2015   CREATININE 1.1 01/20/2016   BUN 14.6 01/20/2016   CO2 23 01/20/2016   TSH 2.00 08/15/2014   INR 1.0 09/22/2014   HGBA1C 7.2 (H) 10/30/2015    BNP (last 3 results) No results for input(s): BNP in the last 8760 hours.  ProBNP (last 3 results) No results for input(s): PROBNP in the last 8760 hours.   Other Studies Reviewed Today:  Coronary angiography from 09/2014:  Coronary dominance: right  Left mainstem: Normal.  Left anterior descending (LAD): The stent in the proximal LAD is widely patent throughout. There is moderate 20% narrowing in the LAD proximal to the stent. The remainder of the vessels without significant disease.  Left circumflex (LCx): The left circumflex gives rise to 2 large marginal branches. There is 20% narrowing prior to the takeoff of the  first OM.  Right coronary artery (RCA): The right coronary is a dominant vessel. It has diffuse 70% disease in the proximal to mid vessel. Is occluded at the crux. There are excellent right to right and left to right collaterals.  Left ventriculography: Left ventricular systolic function is normal, LVEF is estimated at 55-65%, there is no significant  mitral regurgitation  Final Conclusions:  1. Single vessel obstructive coronary disease. 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 The stent in the proximal LAD is widely patent.  2. Normal LV function.  Recommendations: Continue medical management.  Peter Martinique  08/26/2011, 9:09 AM   Assessment / Plan:  1. CAD - last cath with stable findings back in August of 2016. He has a chronic total occlusion of the RCA with good collateral flow - stent in the proximal LAD was patent. He was felt to be best served with medical management. Would like to hold on Myoview - felt this would most likely be abnormal due to the total RCA occlusion. Discussed with Dr. Acie Fredrickson at a prior visit and we agreed to continue with his current medical management. He is doing well after being treated for his yeast infection. No longer on Imdur.   2. HTN - BP good on his current therapy.   3. HLD - on statin therapy - labs today.   4. ED - ok to use Viagra if he would like.    5. Tobacco abuse - much less smoking noted.   6. CLL/chronic thrombocytopenia - followed by oncology/hematology.   7. NIDDM   8. Obesity - discussed again.    Current medicines are reviewed with the patient today.  The patient does not have concerns regarding medicines other than what has been noted above.  The following changes have been made:  See above.  Labs/ tests ordered today include:    Orders Placed This Encounter  Procedures  . Basic metabolic panel  . Hepatic function panel  . Lipid panel     Disposition:   FU with me in 6 months with fasting labs.    Patient is agreeable to this plan and will call if any problems develop in the interim.   SignedTruitt Merle, NP  05/24/2016 8:33 AM  [facility] [address] [place], [place]  [address] Phone: [phone] Fax: [phone]

## 2016-05-24 ENCOUNTER — Ambulatory Visit (INDEPENDENT_AMBULATORY_CARE_PROVIDER_SITE_OTHER): Payer: 59 | Admitting: Nurse Practitioner

## 2016-05-24 ENCOUNTER — Encounter: Payer: Self-pay | Admitting: Nurse Practitioner

## 2016-05-24 VITALS — BP 120/80 | HR 59 | Ht 72.0 in | Wt 235.8 lb

## 2016-05-24 DIAGNOSIS — I10 Essential (primary) hypertension: Secondary | ICD-10-CM | POA: Diagnosis not present

## 2016-05-24 DIAGNOSIS — E7800 Pure hypercholesterolemia, unspecified: Secondary | ICD-10-CM | POA: Diagnosis not present

## 2016-05-24 DIAGNOSIS — I2510 Atherosclerotic heart disease of native coronary artery without angina pectoris: Secondary | ICD-10-CM

## 2016-05-24 DIAGNOSIS — I259 Chronic ischemic heart disease, unspecified: Secondary | ICD-10-CM | POA: Diagnosis not present

## 2016-05-24 DIAGNOSIS — Z720 Tobacco use: Secondary | ICD-10-CM | POA: Diagnosis not present

## 2016-05-24 LAB — LIPID PANEL
Chol/HDL Ratio: 5.4 ratio — ABNORMAL HIGH (ref 0.0–5.0)
Cholesterol, Total: 146 mg/dL (ref 100–199)
HDL: 27 mg/dL — ABNORMAL LOW (ref >39)
LDL Calculated: 79 mg/dL (ref 0–99)
Triglycerides: 201 mg/dL — ABNORMAL HIGH (ref 0–149)
VLDL Cholesterol Cal: 40 mg/dL (ref 5–40)

## 2016-05-24 LAB — BASIC METABOLIC PANEL
BUN/Creatinine Ratio: 10 (ref 10–24)
BUN: 10 mg/dL (ref 8–27)
CO2: 20 mmol/L (ref 18–29)
Calcium: 8.9 mg/dL (ref 8.6–10.2)
Chloride: 101 mmol/L (ref 96–106)
Creatinine, Ser: 0.97 mg/dL (ref 0.76–1.27)
GFR calc Af Amer: 94 mL/min/{1.73_m2} (ref >59)
GFR calc non Af Amer: 82 mL/min/{1.73_m2} (ref >59)
Glucose: 179 mg/dL — ABNORMAL HIGH (ref 65–99)
Potassium: 4.8 mmol/L (ref 3.5–5.2)
Sodium: 138 mmol/L (ref 134–144)

## 2016-05-24 LAB — HEPATIC FUNCTION PANEL
ALT: 23 IU/L (ref 0–44)
AST: 19 IU/L (ref 0–40)
Albumin: 4.3 g/dL (ref 3.6–4.8)
Alkaline Phosphatase: 79 IU/L (ref 39–117)
Bilirubin Total: 0.5 mg/dL (ref 0.0–1.2)
Bilirubin, Direct: 0.13 mg/dL (ref 0.00–0.40)
Total Protein: 6.2 g/dL (ref 6.0–8.5)

## 2016-05-24 MED ORDER — ROSUVASTATIN CALCIUM 5 MG PO TABS: 5.0000 mg | ORAL_TABLET | Freq: Every day | ORAL | 3 refills | Status: DC

## 2016-05-24 MED ORDER — METOPROLOL SUCCINATE ER 25 MG PO TB24: 25.0000 mg | ORAL_TABLET | Freq: Every day | ORAL | 3 refills | Status: DC

## 2016-05-24 NOTE — Patient Instructions (Addendum)
We will be checking the following labs today - BMET, HPF and lipids   Medication Instructions:    Continue with your current medicines.   I sent in your refills today    Testing/Procedures To Be Arranged:  N/A  Follow-Up:   See me in 6 months with fasting labs.     Other Special Instructions:   N/A    If you need a refill on your cardiac medications before your next appointment, please call your pharmacy.   Call the [place] office at [phone] if you have any questions, problems or concerns.

## 2016-05-25 ENCOUNTER — Encounter: Payer: Self-pay | Admitting: *Deleted

## 2016-06-03 ENCOUNTER — Other Ambulatory Visit: Payer: BLUE CROSS/BLUE SHIELD

## 2016-06-03 ENCOUNTER — Ambulatory Visit: Payer: BLUE CROSS/BLUE SHIELD

## 2016-06-10 ENCOUNTER — Other Ambulatory Visit (HOSPITAL_BASED_OUTPATIENT_CLINIC_OR_DEPARTMENT_OTHER): Payer: 59

## 2016-06-10 ENCOUNTER — Ambulatory Visit (HOSPITAL_BASED_OUTPATIENT_CLINIC_OR_DEPARTMENT_OTHER): Payer: 59

## 2016-06-10 VITALS — BP 125/66 | HR 73 | Temp 98.8°F | Resp 17

## 2016-06-10 DIAGNOSIS — C9110 Chronic lymphocytic leukemia of B-cell type not having achieved remission: Secondary | ICD-10-CM

## 2016-06-10 DIAGNOSIS — D801 Nonfamilial hypogammaglobulinemia: Secondary | ICD-10-CM | POA: Diagnosis not present

## 2016-06-10 LAB — CBC WITH DIFFERENTIAL/PLATELET
BASO%: 0.4 % (ref 0.0–2.0)
Basophils Absolute: 0 10*3/uL (ref 0.0–0.1)
EOS ABS: 0 10*3/uL (ref 0.0–0.5)
EOS%: 0.4 % (ref 0.0–7.0)
HCT: 45.3 % (ref 38.4–49.9)
HEMOGLOBIN: 15.6 g/dL (ref 13.0–17.1)
LYMPH%: 42.8 % (ref 14.0–49.0)
MCH: 32.6 pg (ref 27.2–33.4)
MCHC: 34.4 g/dL (ref 32.0–36.0)
MCV: 94.7 fL (ref 79.3–98.0)
MONO#: 0.6 10*3/uL (ref 0.1–0.9)
MONO%: 9.6 % (ref 0.0–14.0)
NEUT%: 46.8 % (ref 39.0–75.0)
NEUTROS ABS: 3 10*3/uL (ref 1.5–6.5)
Platelets: 72 10*3/uL — ABNORMAL LOW (ref 140–400)
RBC: 4.78 10*6/uL (ref 4.20–5.82)
RDW: 14.4 % (ref 11.0–14.6)
WBC: 6.4 10*3/uL (ref 4.0–10.3)
lymph#: 2.7 10*3/uL (ref 0.9–3.3)

## 2016-06-10 MED ORDER — HEPARIN SOD (PORK) LOCK FLUSH 100 UNIT/ML IV SOLN
500.0000 [IU] | Freq: Once | INTRAVENOUS | Status: AC
  Administered 2016-06-10: 500 [IU] via INTRAVENOUS
  Filled 2016-06-10: qty 5

## 2016-06-10 MED ORDER — DEXTROSE 5 % IV SOLN
Freq: Once | INTRAVENOUS | Status: AC
  Administered 2016-06-10: 09:00:00 via INTRAVENOUS

## 2016-06-10 MED ORDER — ACETAMINOPHEN 325 MG PO TABS
650.0000 mg | ORAL_TABLET | Freq: Four times a day (QID) | ORAL | Status: DC | PRN
  Administered 2016-06-10: 650 mg via ORAL

## 2016-06-10 MED ORDER — DIPHENHYDRAMINE HCL 25 MG PO TABS
25.0000 mg | ORAL_TABLET | Freq: Once | ORAL | Status: AC
  Administered 2016-06-10: 25 mg via ORAL
  Filled 2016-06-10: qty 1

## 2016-06-10 MED ORDER — ACETAMINOPHEN 325 MG PO TABS
ORAL_TABLET | ORAL | Status: AC
  Filled 2016-06-10: qty 2

## 2016-06-10 MED ORDER — IMMUNE GLOBULIN (HUMAN) 10 GM/100ML IV SOLN
50.0000 g | Freq: Once | INTRAVENOUS | Status: AC
  Administered 2016-06-10: 50 g via INTRAVENOUS
  Filled 2016-06-10: qty 400

## 2016-06-10 MED ORDER — SODIUM CHLORIDE 0.9% FLUSH
10.0000 mL | INTRAVENOUS | Status: DC | PRN
  Administered 2016-06-10: 10 mL via INTRAVENOUS
  Filled 2016-06-10: qty 10

## 2016-06-10 MED ORDER — DIPHENHYDRAMINE HCL 25 MG PO CAPS
ORAL_CAPSULE | ORAL | Status: AC
  Filled 2016-06-10: qty 1

## 2016-06-10 NOTE — Progress Notes (Signed)
Pt reports that he is currently on antibiotics for a sinus infection prescribed by his primary MD. Pt tolerated infusion well. Pt monitored 30 minutes post infusion. Pt and VS stable at discharge.

## 2016-06-10 NOTE — Patient Instructions (Signed)

## 2016-07-01 ENCOUNTER — Ambulatory Visit: Payer: BLUE CROSS/BLUE SHIELD

## 2016-07-01 ENCOUNTER — Other Ambulatory Visit: Payer: BLUE CROSS/BLUE SHIELD

## 2016-07-01 ENCOUNTER — Ambulatory Visit: Payer: BLUE CROSS/BLUE SHIELD | Admitting: Hematology and Oncology

## 2016-07-08 ENCOUNTER — Other Ambulatory Visit (HOSPITAL_BASED_OUTPATIENT_CLINIC_OR_DEPARTMENT_OTHER): Payer: 59

## 2016-07-08 ENCOUNTER — Ambulatory Visit (HOSPITAL_BASED_OUTPATIENT_CLINIC_OR_DEPARTMENT_OTHER): Payer: 59

## 2016-07-08 VITALS — BP 119/60 | HR 53 | Temp 97.7°F | Resp 16

## 2016-07-08 DIAGNOSIS — C9110 Chronic lymphocytic leukemia of B-cell type not having achieved remission: Secondary | ICD-10-CM

## 2016-07-08 DIAGNOSIS — D801 Nonfamilial hypogammaglobulinemia: Secondary | ICD-10-CM

## 2016-07-08 LAB — CBC WITH DIFFERENTIAL/PLATELET
BASO%: 0.2 % (ref 0.0–2.0)
Basophils Absolute: 0 10*3/uL (ref 0.0–0.1)
EOS ABS: 0 10*3/uL (ref 0.0–0.5)
EOS%: 0.4 % (ref 0.0–7.0)
HEMATOCRIT: 46.6 % (ref 38.4–49.9)
HEMOGLOBIN: 16.1 g/dL (ref 13.0–17.1)
LYMPH%: 64.2 % — ABNORMAL HIGH (ref 14.0–49.0)
MCH: 32.7 pg (ref 27.2–33.4)
MCHC: 34.5 g/dL (ref 32.0–36.0)
MCV: 94.7 fL (ref 79.3–98.0)
MONO#: 0.5 10*3/uL (ref 0.1–0.9)
MONO%: 4.5 % (ref 0.0–14.0)
NEUT%: 30.7 % — ABNORMAL LOW (ref 39.0–75.0)
NEUTROS ABS: 3.2 10*3/uL (ref 1.5–6.5)
NRBC: 0 % (ref 0–0)
PLATELETS: 74 10*3/uL — AB (ref 140–400)
RBC: 4.92 10*6/uL (ref 4.20–5.82)
RDW: 14.4 % (ref 11.0–14.6)
WBC: 10.2 10*3/uL (ref 4.0–10.3)
lymph#: 6.6 10*3/uL — ABNORMAL HIGH (ref 0.9–3.3)

## 2016-07-08 LAB — TECHNOLOGIST REVIEW

## 2016-07-08 MED ORDER — HEPARIN SOD (PORK) LOCK FLUSH 100 UNIT/ML IV SOLN
500.0000 [IU] | INTRAVENOUS | Status: AC | PRN
  Administered 2016-07-08: 500 [IU]
  Filled 2016-07-08: qty 5

## 2016-07-08 MED ORDER — IMMUNE GLOBULIN (HUMAN) 10 GM/100ML IV SOLN
50.0000 g | Freq: Once | INTRAVENOUS | Status: AC
  Administered 2016-07-08: 50 g via INTRAVENOUS
  Filled 2016-07-08: qty 400

## 2016-07-08 MED ORDER — DIPHENHYDRAMINE HCL 25 MG PO CAPS
ORAL_CAPSULE | ORAL | Status: AC
  Filled 2016-07-08: qty 1

## 2016-07-08 MED ORDER — ACETAMINOPHEN 325 MG PO TABS
650.0000 mg | ORAL_TABLET | Freq: Four times a day (QID) | ORAL | Status: DC | PRN
  Administered 2016-07-08: 650 mg via ORAL

## 2016-07-08 MED ORDER — DIPHENHYDRAMINE HCL 25 MG PO TABS
25.0000 mg | ORAL_TABLET | Freq: Once | ORAL | Status: AC
  Administered 2016-07-08: 25 mg via ORAL
  Filled 2016-07-08: qty 1

## 2016-07-08 MED ORDER — SODIUM CHLORIDE 0.9% FLUSH
10.0000 mL | INTRAVENOUS | Status: AC | PRN
  Administered 2016-07-08: 10 mL
  Filled 2016-07-08: qty 10

## 2016-07-08 MED ORDER — DEXTROSE 5 % IV SOLN
Freq: Once | INTRAVENOUS | Status: AC
  Administered 2016-07-08: 09:00:00 via INTRAVENOUS

## 2016-07-08 MED ORDER — ACETAMINOPHEN 325 MG PO TABS
ORAL_TABLET | ORAL | Status: AC
  Filled 2016-07-08: qty 2

## 2016-07-08 NOTE — Patient Instructions (Signed)

## 2016-07-12 ENCOUNTER — Other Ambulatory Visit: Payer: Self-pay | Admitting: Hematology and Oncology

## 2016-08-05 ENCOUNTER — Ambulatory Visit (HOSPITAL_BASED_OUTPATIENT_CLINIC_OR_DEPARTMENT_OTHER): Payer: 59

## 2016-08-05 ENCOUNTER — Other Ambulatory Visit (HOSPITAL_BASED_OUTPATIENT_CLINIC_OR_DEPARTMENT_OTHER): Payer: 59

## 2016-08-05 VITALS — BP 109/55 | HR 56 | Temp 97.6°F | Resp 16

## 2016-08-05 DIAGNOSIS — C9110 Chronic lymphocytic leukemia of B-cell type not having achieved remission: Secondary | ICD-10-CM

## 2016-08-05 DIAGNOSIS — D801 Nonfamilial hypogammaglobulinemia: Secondary | ICD-10-CM | POA: Diagnosis not present

## 2016-08-05 LAB — CBC WITH DIFFERENTIAL/PLATELET
BASO%: 0.4 % (ref 0.0–2.0)
BASOS ABS: 0 10*3/uL (ref 0.0–0.1)
EOS%: 0.2 % (ref 0.0–7.0)
Eosinophils Absolute: 0 10*3/uL (ref 0.0–0.5)
HEMATOCRIT: 47.4 % (ref 38.4–49.9)
HGB: 16.1 g/dL (ref 13.0–17.1)
LYMPH#: 8.6 10*3/uL — AB (ref 0.9–3.3)
LYMPH%: 67.4 % — AB (ref 14.0–49.0)
MCH: 32.2 pg (ref 27.2–33.4)
MCHC: 33.9 g/dL (ref 32.0–36.0)
MCV: 95.1 fL (ref 79.3–98.0)
MONO#: 0.5 10*3/uL (ref 0.1–0.9)
MONO%: 3.9 % (ref 0.0–14.0)
NEUT#: 3.6 10*3/uL (ref 1.5–6.5)
NEUT%: 28.1 % — AB (ref 39.0–75.0)
Platelets: 93 10*3/uL — ABNORMAL LOW (ref 140–400)
RBC: 4.99 10*6/uL (ref 4.20–5.82)
RDW: 14.8 % — ABNORMAL HIGH (ref 11.0–14.6)
WBC: 12.7 10*3/uL — ABNORMAL HIGH (ref 4.0–10.3)

## 2016-08-05 LAB — TECHNOLOGIST REVIEW

## 2016-08-05 MED ORDER — ACETAMINOPHEN 325 MG PO TABS
650.0000 mg | ORAL_TABLET | Freq: Four times a day (QID) | ORAL | Status: DC | PRN
  Administered 2016-08-05: 650 mg via ORAL

## 2016-08-05 MED ORDER — DIPHENHYDRAMINE HCL 25 MG PO CAPS
ORAL_CAPSULE | ORAL | Status: AC
  Filled 2016-08-05: qty 1

## 2016-08-05 MED ORDER — DIPHENHYDRAMINE HCL 25 MG PO TABS
25.0000 mg | ORAL_TABLET | Freq: Once | ORAL | Status: AC
  Administered 2016-08-05: 25 mg via ORAL
  Filled 2016-08-05: qty 1

## 2016-08-05 MED ORDER — HEPARIN SOD (PORK) LOCK FLUSH 100 UNIT/ML IV SOLN
500.0000 [IU] | Freq: Once | INTRAVENOUS | Status: AC
  Administered 2016-08-05: 500 [IU]
  Filled 2016-08-05: qty 5

## 2016-08-05 MED ORDER — SODIUM CHLORIDE 0.9 % IV SOLN
INTRAVENOUS | Status: DC
  Administered 2016-08-05: 09:00:00 via INTRAVENOUS

## 2016-08-05 MED ORDER — ACETAMINOPHEN 325 MG PO TABS
ORAL_TABLET | ORAL | Status: AC
  Filled 2016-08-05: qty 2

## 2016-08-05 MED ORDER — IMMUNE GLOBULIN (HUMAN) 10 GM/100ML IV SOLN
50.0000 g | Freq: Once | INTRAVENOUS | Status: AC
  Administered 2016-08-05: 50 g via INTRAVENOUS
  Filled 2016-08-05: qty 400

## 2016-08-05 MED ORDER — SODIUM CHLORIDE 0.9% FLUSH
10.0000 mL | Freq: Once | INTRAVENOUS | Status: AC
  Administered 2016-08-05: 10 mL
  Filled 2016-08-05: qty 10

## 2016-08-05 NOTE — Patient Instructions (Signed)

## 2016-08-11 ENCOUNTER — Encounter: Payer: Self-pay | Admitting: Hematology and Oncology

## 2016-08-26 ENCOUNTER — Encounter: Payer: Self-pay | Admitting: Hematology and Oncology

## 2016-08-26 NOTE — Progress Notes (Signed)
Received physician form from [place]. Not sure whom applied on behalf of patient. He may have applied himself.Received signed form back and faxed to [facility]. Fax received ok per confirmation sheet.

## 2016-09-01 ENCOUNTER — Other Ambulatory Visit: Payer: Self-pay | Admitting: Hematology and Oncology

## 2016-09-01 DIAGNOSIS — C9110 Chronic lymphocytic leukemia of B-cell type not having achieved remission: Secondary | ICD-10-CM

## 2016-09-01 NOTE — Assessment & Plan Note (Addendum)
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

## 2016-09-02 ENCOUNTER — Other Ambulatory Visit: Payer: Self-pay | Admitting: *Deleted

## 2016-09-02 ENCOUNTER — Telehealth: Payer: Self-pay | Admitting: Hematology and Oncology

## 2016-09-02 ENCOUNTER — Other Ambulatory Visit (HOSPITAL_COMMUNITY)
Admission: RE | Admit: 2016-09-02 | Discharge: 2016-09-02 | Disposition: A | Discharge status: Home / Self Care | Payer: 59 | Source: Ambulatory Visit | Attending: Hematology and Oncology | Admitting: Hematology and Oncology

## 2016-09-02 ENCOUNTER — Other Ambulatory Visit: Payer: Self-pay | Admitting: Hematology and Oncology

## 2016-09-02 ENCOUNTER — Ambulatory Visit (HOSPITAL_BASED_OUTPATIENT_CLINIC_OR_DEPARTMENT_OTHER): Payer: 59 | Admitting: Hematology and Oncology

## 2016-09-02 ENCOUNTER — Ambulatory Visit (HOSPITAL_BASED_OUTPATIENT_CLINIC_OR_DEPARTMENT_OTHER): Payer: 59

## 2016-09-02 ENCOUNTER — Other Ambulatory Visit (HOSPITAL_BASED_OUTPATIENT_CLINIC_OR_DEPARTMENT_OTHER): Payer: 59

## 2016-09-02 VITALS — BP 142/68 | HR 64 | Temp 97.8°F | Resp 18

## 2016-09-02 DIAGNOSIS — C9110 Chronic lymphocytic leukemia of B-cell type not having achieved remission: Secondary | ICD-10-CM

## 2016-09-02 DIAGNOSIS — D801 Nonfamilial hypogammaglobulinemia: Secondary | ICD-10-CM | POA: Diagnosis not present

## 2016-09-02 DIAGNOSIS — C9190 Lymphoid leukemia, unspecified not having achieved remission: Secondary | ICD-10-CM | POA: Diagnosis not present

## 2016-09-02 DIAGNOSIS — D696 Thrombocytopenia, unspecified: Secondary | ICD-10-CM | POA: Diagnosis not present

## 2016-09-02 LAB — TECHNOLOGIST REVIEW

## 2016-09-02 LAB — CBC WITH DIFFERENTIAL/PLATELET
BASO%: 0.1 % (ref 0.0–2.0)
BASOS ABS: 0 10*3/uL (ref 0.0–0.1)
EOS%: 0.2 % (ref 0.0–7.0)
Eosinophils Absolute: 0 10*3/uL (ref 0.0–0.5)
HCT: 43.3 % (ref 38.4–49.9)
HGB: 15 g/dL (ref 13.0–17.1)
LYMPH%: 72 % — AB (ref 14.0–49.0)
MCH: 33 pg (ref 27.2–33.4)
MCHC: 34.6 g/dL (ref 32.0–36.0)
MCV: 95.2 fL (ref 79.3–98.0)
MONO#: 0.7 10*3/uL (ref 0.1–0.9)
MONO%: 5.5 % (ref 0.0–14.0)
NEUT%: 22.2 % — AB (ref 39.0–75.0)
NEUTROS ABS: 2.6 10*3/uL (ref 1.5–6.5)
Platelets: 60 10*3/uL — ABNORMAL LOW (ref 140–400)
RBC: 4.55 10*6/uL (ref 4.20–5.82)
RDW: 15.7 % — ABNORMAL HIGH (ref 11.0–14.6)
WBC: 11.8 10*3/uL — AB (ref 4.0–10.3)
lymph#: 8.5 10*3/uL — ABNORMAL HIGH (ref 0.9–3.3)

## 2016-09-02 LAB — COMPREHENSIVE METABOLIC PANEL
ALT: 16 U/L (ref 0–55)
AST: 17 U/L (ref 5–34)
Albumin: 3.8 g/dL (ref 3.5–5.0)
Alkaline Phosphatase: 64 U/L (ref 40–150)
Anion Gap: 7 mEq/L (ref 3–11)
BUN: 12 mg/dL (ref 7.0–26.0)
CO2: 24 meq/L (ref 22–29)
CREATININE: 1.1 mg/dL (ref 0.7–1.3)
Calcium: 9 mg/dL (ref 8.4–10.4)
Chloride: 107 mEq/L (ref 98–109)
EGFR: 67 mL/min/{1.73_m2} — ABNORMAL LOW (ref >90)
GLUCOSE: 182 mg/dL — AB (ref 70–140)
Potassium: 5.1 mEq/L (ref 3.5–5.1)
Sodium: 139 mEq/L (ref 136–145)
TOTAL PROTEIN: 6.4 g/dL (ref 6.4–8.3)
Total Bilirubin: 0.43 mg/dL (ref 0.20–1.20)

## 2016-09-02 LAB — URIC ACID: Uric Acid, Serum: 6.8 mg/dl (ref 2.6–7.4)

## 2016-09-02 LAB — LACTATE DEHYDROGENASE: LDH: 186 U/L (ref 125–245)

## 2016-09-02 MED ORDER — SODIUM CHLORIDE 0.9% FLUSH
10.0000 mL | Freq: Once | INTRAVENOUS | Status: AC
  Administered 2016-09-02: 10 mL
  Filled 2016-09-02: qty 10

## 2016-09-02 MED ORDER — PREDNISONE 20 MG PO TABS: 20.0000 mg | ORAL_TABLET | Freq: Every day | ORAL | 0 refills | Status: DC

## 2016-09-02 MED ORDER — DIPHENHYDRAMINE HCL 25 MG PO CAPS
ORAL_CAPSULE | ORAL | Status: AC
  Filled 2016-09-02: qty 1

## 2016-09-02 MED ORDER — IMMUNE GLOBULIN (HUMAN) 10 GM/100ML IV SOLN
0.4500 g/kg | Freq: Once | INTRAVENOUS | Status: AC
  Administered 2016-09-02: 50 g via INTRAVENOUS
  Filled 2016-09-02: qty 100

## 2016-09-02 MED ORDER — DIPHENHYDRAMINE HCL 25 MG PO TABS
25.0000 mg | ORAL_TABLET | Freq: Once | ORAL | Status: AC
  Administered 2016-09-02: 25 mg via ORAL
  Filled 2016-09-02: qty 1

## 2016-09-02 MED ORDER — ACETAMINOPHEN 325 MG PO TABS
650.0000 mg | ORAL_TABLET | Freq: Once | ORAL | Status: AC
  Administered 2016-09-02: 650 mg via ORAL

## 2016-09-02 MED ORDER — DEXTROSE 5 % IV SOLN
Freq: Once | INTRAVENOUS | Status: AC
  Administered 2016-09-02: 11:00:00 via INTRAVENOUS

## 2016-09-02 MED ORDER — HEPARIN SOD (PORK) LOCK FLUSH 100 UNIT/ML IV SOLN
500.0000 [IU] | Freq: Once | INTRAVENOUS | Status: AC
  Administered 2016-09-02: 500 [IU]
  Filled 2016-09-02: qty 5

## 2016-09-02 MED ORDER — ACETAMINOPHEN 325 MG PO TABS
ORAL_TABLET | ORAL | Status: AC
  Filled 2016-09-02: qty 2

## 2016-09-02 NOTE — Patient Instructions (Signed)
Ibrutinib capsules What is this medicine? IBRUTINIB (eye BROO ti nib) is a medicine that targets proteins in cancer cells and stops the cancer cells from growing. It is used to treat mantle cell lymphoma, chronic lymphocytic leukemia, small lymphocytic lymphoma, marginal zone lymphoma, Waldenstrom macroglobulinemia, and chronic graft-versus-host disease. This medicine may be used for other purposes; ask your health care provider or pharmacist if you have questions. COMMON BRAND NAME(S): IMBRUVICA What should I tell my health care provider before I take this medicine? They need to know if you have any of these conditions: -bleeding disorders -diabetes -heart disease -high blood pressure -high cholesterol -history of irregular heartbeat -infection -liver disease -recent surgery -smoke tobacco -take medicines that treat or prevent blood clots -an unusual or allergic reaction to ibrutinib, other medicines, foods, dyes, or preservatives -pregnant or trying to get pregnant -breast-feeding How should I use this medicine? Take this medicine by mouth with a glass of water. Follow the directions on the prescription label. Do not cut, crush or chew this medicine. Do not take with grapefruit juice or eat [place] oranges. Take your medicine at regular intervals. Do not take it more often than directed. Do not stop taking except on your doctor's advice. Talk to your pediatrician regarding the use of this medicine in children. Special care may be needed. Overdosage: If you think you have taken too much of this medicine contact a poison control center or emergency room at once. NOTE: This medicine is only for you. Do not share this medicine with others. What if I miss a dose? If you miss a dose, take it as soon as you can. If it is almost time for your next dose, take only that dose. Do not take double or extra doses. What may interact with this medicine? This medicine may interact with the following  medications: -antiviral medications for HIV or AIDS -aprepitant -boceprevir -calcium channel blockers like diltiazem and verapamil -certain antibiotics like clarithromycin, erythromycin, and troleandomycin -certain medicines for fungal infections like fluconazole, ketoconazole, itraconazole, posaconazole, and voriconazole -certain medicines for seizures like carbamazepine and phenytoin -cimetidine -ciprofloxacin -clotrimazole -conivaptan -crizotinib -cyclosporine -digoxin -dronedarone -enzalutamide -fluvoxamine -grapefruit juice or [place] oranges -idelalisib -imatinib -methotrexate -mitotane -nefazodone -rifampin -St. John's wort -tofisopam This list may not describe all possible interactions. Give your health care provider a list of all the medicines, herbs, non-prescription drugs, or dietary supplements you use. Also tell them if you smoke, drink alcohol, or use illegal drugs. Some items may interact with your medicine. What should I watch for while using this medicine? You may need blood work done while you are taking this medicine. This medicine may increase your risk to bruise or bleed. Call your doctor or health care professional if you notice any unusual bleeding. Call your doctor or health care professional for advice if you get a fever, chills or sore throat, or other symptoms of a cold or flu. Do not treat yourself. This drug decreases your body's ability to fight infections. Try to avoid being around people who are sick. If you are going to have surgery or any other procedures, tell your doctor you are taking this medicine. Tell your dentist and dental surgeon that you are taking this medicine. You should not have major dental surgery while on this medicine. See your dentist to have a dental exam and fix any dental problems before starting this medicine. Talk to your doctor about your risk of cancer. You may be more at risk for certain types of cancers  if you take this  medicine. Do not become pregnant while taking this medicine or for 1 month after stopping it. Women should inform their doctor if they wish to become pregnant or think they might be pregnant. Men should not father a child while taking this medicine and for 1 month after stopping it. There is a potential for serious side effects to an unborn child. Talk to your health care professional or pharmacist for more information. What side effects may I notice from receiving this medicine? Side effects that you should report to your doctor or health care professional as soon as possible: -allergic reactions like skin rash, itching or hives, swelling of the face, lips, or tongue -low blood counts - this medicine may decrease the number of white blood cells, red blood cells and platelets. You may be at increased risk for infections and bleeding -signs or symptoms of bleeding such as bloody or black, tarry stools; red or dark-brown urine; spitting up blood or brown material that looks like coffee grounds; red spots on the skin; unusual bruising or bleeding from the eye, gums, or nose; confusion; trouble speaking or understanding; severe headaches; weakness; or dizziness -signs and symptoms of a dangerous change in heartbeat or heart rhythm like chest pain; dizziness; fast or irregular heartbeat; palpitations; feeling faint or lightheaded, falls; breathing problems -signs and symptoms of infection like fever or chills; cough; sore throat; or pain when urinating -signs and symptoms of kidney injury like trouble passing urine or change in the amount of urine Side effects that usually do not require medical attention (report to your doctor or health care professional if they continue or are bothersome): -bone pain -diarrhea -mouth sores -muscle cramps -muscle pain -nausea -tiredness This list may not describe all possible side effects. Call your doctor for medical advice about side effects. You may report side  effects to FDA at 1-800-FDA-1088. Where should I keep my medicine? Keep out of the reach of children. Store between 20 and 25 degrees C (68 and 77 degrees F). Keep this medicine in the original container. Throw away any unused medicine after the expiration date. NOTE: This sheet is a summary. It may not cover all possible information. If you have questions about this medicine, talk to your doctor, pharmacist, or health care provider.  2018 Elsevier/Gold Standard (2015-12-17 18:48:51) Bendamustine Injection What is this medicine? BENDAMUSTINE (BEN da MUS teen) is a chemotherapy drug. It is used to treat chronic lymphocytic leukemia and non-Hodgkin lymphoma. This medicine may be used for other purposes; ask your health care provider or pharmacist if you have questions. COMMON BRAND NAME(S): BENDEKA, Treanda What should I tell my health care provider before I take this medicine? They need to know if you have any of these conditions: -infection (especially a virus infection such as chickenpox, cold sores, or herpes) -kidney disease -liver disease -an unusual or allergic reaction to bendamustine, mannitol, other medicines, foods, dyes, or preservatives -pregnant or trying to get pregnant -breast-feeding How should I use this medicine? This medicine is for infusion into a vein. It is given by a health care professional in a hospital or clinic setting. Talk to your pediatrician regarding the use of this medicine in children. Special care may be needed. Overdosage: If you think you have taken too much of this medicine contact a poison control center or emergency room at once. NOTE: This medicine is only for you. Do not share this medicine with others. What if I miss a dose? It is  important not to miss your dose. Call your doctor or health care professional if you are unable to keep an appointment. What may interact with this medicine? Do not take this medicine with any of the following  medications: -clozapine This medicine may also interact with the following medications: -atazanavir -cimetidine -ciprofloxacin -enoxacin -fluvoxamine -medicines for seizures like carbamazepine and phenobarbital -mexiletine -rifampin -tacrine -thiabendazole -zileuton This list may not describe all possible interactions. Give your health care provider a list of all the medicines, herbs, non-prescription drugs, or dietary supplements you use. Also tell them if you smoke, drink alcohol, or use illegal drugs. Some items may interact with your medicine. What should I watch for while using this medicine? This drug may make you feel generally unwell. This is not uncommon, as chemotherapy can affect healthy cells as well as cancer cells. Report any side effects. Continue your course of treatment even though you feel ill unless your doctor tells you to stop. You may need blood work done while you are taking this medicine. Call your doctor or health care professional for advice if you get a fever, chills or sore throat, or other symptoms of a cold or flu. Do not treat yourself. This drug decreases your body's ability to fight infections. Try to avoid being around people who are sick. This medicine may increase your risk to bruise or bleed. Call your doctor or health care professional if you notice any unusual bleeding. Talk to your doctor about your risk of cancer. You may be more at risk for certain types of cancers if you take this medicine. Do not become pregnant while taking this medicine or for 3 months after stopping it. Women should inform their doctor if they wish to become pregnant or think they might be pregnant. Men should not father a child while taking this medicine and for 3 months after stopping it.There is a potential for serious side effects to an unborn child. Talk to your health care professional or pharmacist for more information. Do not breast-feed an infant while taking this  medicine. This medicine may interfere with the ability to have a child. You should talk with your doctor or health care professional if you are concerned about your fertility. What side effects may I notice from receiving this medicine? Side effects that you should report to your doctor or health care professional as soon as possible: -allergic reactions like skin rash, itching or hives, swelling of the face, lips, or tongue -low blood counts - this medicine may decrease the number of white blood cells, red blood cells and platelets. You may be at increased risk for infections and bleeding. -redness, blistering, peeling or loosening of the skin, including inside the mouth -signs of infection - fever or chills, cough, sore throat, pain or difficulty passing urine -signs of decreased platelets or bleeding - bruising, pinpoint red spots on the skin, black, tarry stools, blood in the urine -signs of decreased red blood cells - unusually weak or tired, fainting spells, lightheadedness -signs and symptoms of kidney injury like trouble passing urine or change in the amount of urine -signs and symptoms of liver injury like dark yellow or brown urine; general ill feeling or flu-like symptoms; light-colored stools; loss of appetite; nausea; right upper belly pain; unusually weak or tired; yellowing of the eyes or skin Side effects that usually do not require medical attention (report to your doctor or health care professional if they continue or are bothersome): -constipation -decreased appetite -diarrhea -headache -mouth sores -  nausea/vomiting -tiredness This list may not describe all possible side effects. Call your doctor for medical advice about side effects. You may report side effects to FDA at 1-800-FDA-1088. Where should I keep my medicine? This drug is given in a hospital or clinic and will not be stored at home. NOTE: This sheet is a summary. It may not cover all possible information. If you  have questions about this medicine, talk to your doctor, pharmacist, or health care provider.  2018 Elsevier/Gold Standard (2014-12-04 08:45:41)

## 2016-09-02 NOTE — Telephone Encounter (Signed)
Scheduled appt per 7/20 los - Gave patient AVS and calender per los.  

## 2016-09-02 NOTE — Progress Notes (Signed)
Per Hassan Rowan, RN per Dr. Alvy Bimler okay to treat with platelets of 60.

## 2016-09-02 NOTE — Patient Instructions (Signed)

## 2016-09-03 LAB — IGG, IGA, IGM
IGM (IMMUNOGLOBIN M), SRM: 15 mg/dL — AB (ref 20–172)
IgA, Qn, Serum: 56 mg/dL — ABNORMAL LOW (ref 61–437)
IgG, Qn, Serum: 795 mg/dL (ref 700–1600)

## 2016-09-04 ENCOUNTER — Encounter: Payer: Self-pay | Admitting: Hematology and Oncology

## 2016-09-04 NOTE — Assessment & Plan Note (Signed)
I will proceed with IVIG In the future, I plan to start him on chemotherapy and hopefully his hypogammaglobulinemia might improve

## 2016-09-04 NOTE — Progress Notes (Signed)
[place] OFFICE PROGRESS NOTE  Patient Care Team: [name]., PA-C as PCP - General (Family Medicine) Burtis Junes, NP as Nurse Practitioner (Cardiology) Carol Ada, MD as Consulting Physician (Gastroenterology)  SUMMARY OF ONCOLOGIC HISTORY:  This patient was originally diagnosed with CLL after he was found to have leukocytosis. Imaging study with CT scan show diffuse lymphadenopathy with splenomegaly. Due to worsening thrombocytopenia, he was subsequently treated with combination therapy with fludarabine, Cytoxan and rituximab from May 2014 to October 2014. The patient denies side effects or complications from treatment. He has significant smoking history with suspected COPD. 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 From October 2014 onwards, he was prescribed monthly IVIG infusion.  INTERVAL HISTORY Please see below for problem oriented charting. He denies new lymphadenopathy He has significant bruising Denies recent chest pain or shortness of breath Denies recent infection requiring antibiotic treatment  REVIEW OF SYSTEMS:   Constitutional: Denies fevers, chills or abnormal weight loss Eyes: Denies blurriness of vision Ears, nose, mouth, throat, and face: Denies mucositis or sore throat Respiratory: Denies cough, dyspnea or wheezes Cardiovascular: Denies palpitation, chest discomfort or lower extremity swelling Gastrointestinal:  Denies nausea, heartburn or change in bowel habits Skin: Denies abnormal skin rashes Lymphatics: Denies new lymphadenopathy Neurological:Denies numbness, tingling or new weaknesses Behavioral/Psych: Mood is stable, no new changes  All other systems were reviewed with the patient and are negative.  I have reviewed the past medical history, past surgical history, social history and family history with the patient and they are unchanged from previous note.  ALLERGIES:  is  allergic to codeine.  MEDICATIONS:  Current Outpatient Prescriptions  Medication Sig Dispense Refill  . acetaminophen (TYLENOL) 500 MG tablet Take 500 mg by mouth every 6 (six) hours as needed for mild pain, moderate pain or headache.    Marland Kitchen acyclovir (ZOVIRAX) 400 MG tablet Take 1 tablet (400 mg total) by mouth daily. 90 tablet 3  . ALPRAZolam (XANAX) 0.25 MG tablet Take 0.25 mg by mouth as needed for anxiety.    Marland Kitchen aspirin 325 MG tablet Take 325 mg by mouth daily.      . benzonatate (TESSALON) 100 MG capsule One to two pills three times per day for cough    . cyanocobalamin (,VITAMIN B-12,) 1000 MCG/ML injection Inject 1 mL (1,000 mcg total) into the muscle every 30 (thirty) days. 10 mL 1  . esomeprazole (NEXIUM) 40 MG capsule Take 40 mg by mouth 2 (two) times daily.  0  . fluconazole (DIFLUCAN) 200 MG tablet take 2 tablets by mouth on day 1 then 1 tablet until finished  0  . HYDROcodone-acetaminophen (NORCO) 10-325 MG tablet take 1 tablet by mouth every 6 to 8 hours if needed for pain  0  . lidocaine (LIDODERM) 5 % apply patch to affected area once daily as directed for lower back pain  0  . lidocaine-prilocaine (EMLA) cream apply to affected area topically if needed 30 g 3  . meclizine (ANTIVERT) 12.5 MG tablet Take 12.5 mg by mouth every 6 (six) hours as needed for dizziness.   0  . metFORMIN (GLUCOPHAGE-XR) 500 MG 24 hr tablet Take 500 mg by mouth 2 (two) times daily.    . metoprolol succinate (TOPROL XL) 25 MG 24 hr tablet Take 1 tablet (25 mg total) by mouth daily. 90 tablet 3  . nitroGLYCERIN (NITROSTAT) 0.4 MG SL tablet place 1 tablet under the tongue if needed every 5 minutes for chest  pain 25 tablet 3  . predniSONE (DELTASONE) 20 MG tablet Take 1 tablet (20 mg total) by mouth daily with breakfast. 7 tablet 0  . pregabalin (LYRICA) 100 MG capsule 1 cap three times daily and 2 caps at hs 450 capsule 1  . rosuvastatin (CRESTOR) 5 MG tablet Take 1 tablet (5 mg total) by mouth daily. 90  tablet 3  . tetrahydrozoline 0.05 % ophthalmic solution Place 1 drop into both eyes as needed. Red eyes    . topiramate (TOPAMAX) 25 MG tablet 3 tabs twice daily if necessary 540 tablet 2  . zolpidem (AMBIEN) 10 MG tablet Take 10 mg by mouth at bedtime as needed for sleep.   0   No current facility-administered medications for this visit.    Facility-Administered Medications Ordered in Other Visits  Medication Dose Route Frequency Provider Last Rate Last Dose  . 0.9 %  sodium chloride infusion   Intravenous Continuous Alvy Bimler, Irys Nigh, MD 50 mL/hr at 03/07/14 1005    . acetaminophen (TYLENOL) tablet 650 mg  650 mg Oral Q6H PRN [name], MD   650 mg at 03/07/14 1012  . sodium chloride 0.9 % injection 10 mL  10 mL Intracatheter PRN Marcy Panning, MD   10 mL at 08/22/12 1721    PHYSICAL EXAMINATION: ECOG PERFORMANCE STATUS: 1 - Symptomatic but completely ambulatory  Vitals:   09/02/16 0936  BP: 131/69  Pulse: (!) 56  Resp: 18  Temp: 98 F (36.7 C)   Filed Weights   09/02/16 0936  Weight: 235 lb 3.2 oz (106.7 kg)    GENERAL:alert, no distress and comfortable SKIN: Noted skin bruising EYES: normal, Conjunctiva are pink and non-injected, sclera clear OROPHARYNX:no exudate, no erythema and lips, buccal mucosa, and tongue normal  NECK: supple, thyroid normal size, non-tender, without nodularity LYMPH:  no palpable lymphadenopathy in the cervical, axillary or inguinal LUNGS: clear to auscultation and percussion with normal breathing effort HEART: regular rate & rhythm and no murmurs and no lower extremity edema ABDOMEN:abdomen soft, non-tender and normal bowel sounds Musculoskeletal:no cyanosis of digits and no clubbing  NEURO: alert & oriented x 3 with fluent speech, no focal motor/sensory deficits  LABORATORY DATA:  I have reviewed the data as listed    Component Value Date/Time   NA 139 09/02/2016 0922   K 5.1 09/02/2016 0922   CL 101 05/24/2016 0848   CL 104 08/03/2012 1229    CO2 24 09/02/2016 0922   GLUCOSE 182 (H) 09/02/2016 0922   GLUCOSE 223 (H) 08/03/2012 1229   BUN 12.0 09/02/2016 0922   CREATININE 1.1 09/02/2016 0922   CALCIUM 9.0 09/02/2016 0922   PROT 6.4 09/02/2016 0922   ALBUMIN 3.8 09/02/2016 0922   AST 17 09/02/2016 0922   ALT 16 09/02/2016 0922   ALKPHOS 64 09/02/2016 0922   BILITOT 0.43 09/02/2016 0922   GFRNONAA 82 05/24/2016 0848   GFRAA 94 05/24/2016 0848    No results found for: SPEP, UPEP  Lab Results  Component Value Date   WBC 11.8 (H) 09/02/2016   NEUTROABS 2.6 09/02/2016   HGB 15.0 09/02/2016   HCT 43.3 09/02/2016   MCV 95.2 09/02/2016   PLT 60 (L) 09/02/2016      Chemistry      Component Value Date/Time   NA 139 09/02/2016 0922   K 5.1 09/02/2016 0922   CL 101 05/24/2016 0848   CL 104 08/03/2012 1229   CO2 24 09/02/2016 0922   BUN 12.0 09/02/2016 2025  CREATININE 1.1 09/02/2016 0922      Component Value Date/Time   CALCIUM 9.0 09/02/2016 0922   ALKPHOS 64 09/02/2016 0922   AST 17 09/02/2016 0922   ALT 16 09/02/2016 0922   BILITOT 0.43 09/02/2016 0922      ASSESSMENT & PLAN:  CLL (chronic lymphocytic leukemia) I am concerned about progressive lymphocytosis along with thrombocytopenia It is likely that CLL had relapse Upon review of his chart, I did not see prognostic markers being done and I have ordered additional workup We discussed potential need to resume treatment I am ordering FISH panel to guide treatment I also recommend imaging study to stage his disease and I plan to see him back to discuss test results.  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 He is receiving IVIG. He is not symptomatic. There is no contraindication for him to remain on aspirin as well as the patient had no clinical bleeding.    Orders Placed This  Encounter  Procedures  . CT CHEST W CONTRAST    Standing Status:   Future    Standing Expiration Date:   11/02/2017    Order Specific Question:   Reason for Exam (SYMPTOM  OR DIAGNOSIS REQUIRED)    Answer:   staging CLL/lymphoma    Order Specific Question:   Preferred imaging location?    Answer:   [facility]  . CT ABDOMEN PELVIS W CONTRAST    Standing Status:   Future    Standing Expiration Date:   12/03/2017    Order Specific Question:   Reason for Exam (SYMPTOM  OR DIAGNOSIS REQUIRED)    Answer:   staging CLL/lymphoma    Order Specific Question:   Preferred imaging location?    Answer:   [facility]   All questions were answered. The patient knows to call the clinic with any problems, questions or concerns. No barriers to learning was detected. I spent 20 minutes counseling the patient face to face. The total time spent in the appointment was 25 minutes and more than 50% was on counseling and review of test results     [name], MD 09/04/2016 1:27 PM

## 2016-09-04 NOTE — Assessment & Plan Note (Signed)
This is due to ITP/splenomegaly. He is receiving IVIG. He is not symptomatic. There is no contraindication for him to remain on aspirin as well as the patient had no clinical bleeding.

## 2016-09-05 ENCOUNTER — Telehealth: Payer: Self-pay | Admitting: Pharmacy Technician

## 2016-09-05 ENCOUNTER — Other Ambulatory Visit: Payer: Self-pay | Admitting: Hematology and Oncology

## 2016-09-05 DIAGNOSIS — C9110 Chronic lymphocytic leukemia of B-cell type not having achieved remission: Secondary | ICD-10-CM

## 2016-09-05 MED ORDER — IBRUTINIB 420 MG PO TABS
420.0000 mg | ORAL_TABLET | Freq: Every day | ORAL | 11 refills | Status: DC
Start: 2016-09-05 — End: 2016-09-23

## 2016-09-05 NOTE — Telephone Encounter (Signed)
Oral Oncology Patient Advocate Encounter  Received notification from [place] that prior authorization for Imbruvica is required.  PA submitted on CoverMyMeds Key [id number] Status is pending  This encounter will be updated with final determination and copayment information.   Fabio Asa. Melynda [facility], [facility] Patient Advocate [phone] 09/05/2016 2:24 PM

## 2016-09-06 LAB — FLOW CYTOMETRY

## 2016-09-06 NOTE — Telephone Encounter (Signed)
Hi Emily, we are not starting treatment yet until further scans and eval If you can find out copayment will be helpful

## 2016-09-06 NOTE — Telephone Encounter (Signed)
Oral Oncology Patient Advocate Encounter  Prior Authorization for Ryan Copeland has been approved.    PA# [id number] Effective dates: 09/05/2016 through 09/05/2017  This patient's insurance plan requires that this prescription be filled through [facility].    The oral chemotherapy clinic will ensure that this occurs.     Fabio Asa. Melynda [facility], [facility] Patient Advocate [phone] 09/06/2016 10:21 AM

## 2016-09-06 NOTE — Telephone Encounter (Signed)
Working on copayment information: I contacted [employer] and they informed me that a request for $$ limit override had to be submitted (in addition to the already approved prior authorization).  I have submitted this request and will alert you to the copayment once it it is available.    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  I have already obtained a copay card for the patient that should reduce the out of pocket cost to $10 monthly.  I will confirm this once I am able to receive a paid claim from the insurance company.    I will continue to follow and provide updates.   Fabio Asa. Melynda Keller, [place] [facility] [place] [phone] 09/06/2016 11:34 AM

## 2016-09-08 LAB — FISH,CLL PROGNOSTIC PANEL

## 2016-09-09 NOTE — Telephone Encounter (Signed)
Oral Oncology Patient Advocate Encounter  Able to obtain final copayment information on Ryan Copeland new prescription for Imbruvica:   -The patient's prescription drug plan carries a $9000 deductible which has not been met thus far this year.   -His initial copayment would be $8225.93.  This copayment would meet his deductible.   -Subsequent copayments for this year would be 30% of the drug cost.  With current drug pricing this is an estimated ~$3400 monthly copayment.   -A copayment card has been obtained that will reduce the patient's out of pocket cost to $10 monthly.  However, given that the annual maximum benefit on this copay card is $24,600, it will only provide roughly 5 months of treatment with Imbruvica.   I have not reviewed this information with the patient given that further scans and evaluation is pending.    Please let me know if there is anything else I can do to assist.   Fabio Asa. Melynda Keller, [place] [facility] [place] [phone] 09/09/2016 9:51 AM

## 2016-09-15 ENCOUNTER — Ambulatory Visit (HOSPITAL_COMMUNITY)
Admission: RE | Admit: 2016-09-15 | Discharge: 2016-09-15 | Disposition: A | Discharge status: Home / Self Care | Payer: 59 | Source: Ambulatory Visit | Attending: Hematology and Oncology | Admitting: Hematology and Oncology

## 2016-09-15 DIAGNOSIS — R161 Splenomegaly, not elsewhere classified: Secondary | ICD-10-CM | POA: Diagnosis not present

## 2016-09-15 DIAGNOSIS — C9110 Chronic lymphocytic leukemia of B-cell type not having achieved remission: Secondary | ICD-10-CM

## 2016-09-15 DIAGNOSIS — C9190 Lymphoid leukemia, unspecified not having achieved remission: Secondary | ICD-10-CM | POA: Insufficient documentation

## 2016-09-15 DIAGNOSIS — I700 Atherosclerosis of aorta: Secondary | ICD-10-CM | POA: Diagnosis not present

## 2016-09-15 DIAGNOSIS — I2510 Atherosclerotic heart disease of native coronary artery without angina pectoris: Secondary | ICD-10-CM | POA: Insufficient documentation

## 2016-09-15 MED ORDER — IOPAMIDOL (ISOVUE-300) INJECTION 61%
100.0000 mL | Freq: Once | INTRAVENOUS | Status: AC | PRN
  Administered 2016-09-15: 100 mL via INTRAVENOUS

## 2016-09-15 MED ORDER — IOPAMIDOL (ISOVUE-300) INJECTION 61%
INTRAVENOUS | Status: AC
  Filled 2016-09-15: qty 100

## 2016-09-16 NOTE — Telephone Encounter (Signed)
Thanks for the update I will discuss with patient next week

## 2016-09-19 ENCOUNTER — Telehealth: Payer: Self-pay | Admitting: Pharmacist

## 2016-09-19 NOTE — Telephone Encounter (Signed)
Oral Oncology Pharmacist Encounter  Received new prescription for Imbruvica for the treatment of chronic lymphocytic leukemia, planned duration until diease progression or unacceptable toxicity.  Labs from 09/02/16 assessed, noted thrombocytopenia, OK for treatment. EKGs show QTc's OK for treatment, last performed 11/30/15 (411 msec) LVEF last assessed by ECHO on 12/14/16 (55-60%) Patient followed by cardiology, next visit 11/29/16  Current medication list in Epic reviewed, DDIs with aspiriin identified:  Category C interaction due to anti-platelet effects of both agents. Aspirin use with low platelets should be weighed against bleeding risk. Aspirin added to Epic med list in 2012, so patient may have discontinued it's use already. Will follow up with patient and MD.  Prior authorization and copayment issues discussed in 7/23 telephone note from Gilmore Laroche, Oral oncology Patient Advocate.   Will await further instructions from MD prior to continued processing of prescription.  Oral Oncology Clinic will continue to follow for copayment issues, initial counseling and start date.  Johny Drilling, PharmD, BCPS, BCOP 09/19/2016 10:41 AM Oral Oncology Clinic [phone]

## 2016-09-21 ENCOUNTER — Ambulatory Visit (HOSPITAL_BASED_OUTPATIENT_CLINIC_OR_DEPARTMENT_OTHER): Payer: 59 | Admitting: Hematology and Oncology

## 2016-09-21 ENCOUNTER — Telehealth: Payer: Self-pay | Admitting: Hematology and Oncology

## 2016-09-21 VITALS — BP 129/67 | HR 55 | Temp 97.8°F | Resp 17 | Ht 72.0 in | Wt 230.0 lb

## 2016-09-21 DIAGNOSIS — D801 Nonfamilial hypogammaglobulinemia: Secondary | ICD-10-CM | POA: Diagnosis not present

## 2016-09-21 DIAGNOSIS — C9190 Lymphoid leukemia, unspecified not having achieved remission: Secondary | ICD-10-CM

## 2016-09-21 DIAGNOSIS — C9110 Chronic lymphocytic leukemia of B-cell type not having achieved remission: Secondary | ICD-10-CM

## 2016-09-21 MED ORDER — LIDOCAINE-PRILOCAINE 2.5-2.5 % EX CREA: TOPICAL_CREAM | CUTANEOUS | 3 refills | Status: DC

## 2016-09-21 MED ORDER — ONDANSETRON HCL 8 MG PO TABS: 8.0000 mg | ORAL_TABLET | Freq: Two times a day (BID) | ORAL | 1 refills | Status: DC | PRN

## 2016-09-21 MED ORDER — ALLOPURINOL 300 MG PO TABS: 300.0000 mg | ORAL_TABLET | Freq: Every day | ORAL | 0 refills | Status: DC

## 2016-09-21 MED ORDER — ACYCLOVIR 400 MG PO TABS: 400.0000 mg | ORAL_TABLET | Freq: Two times a day (BID) | ORAL | 3 refills | Status: DC

## 2016-09-21 MED ORDER — PROCHLORPERAZINE MALEATE 10 MG PO TABS: 10.0000 mg | ORAL_TABLET | Freq: Four times a day (QID) | ORAL | 1 refills | Status: DC | PRN

## 2016-09-21 MED ORDER — PREDNISONE 20 MG PO TABS: 60.0000 mg | ORAL_TABLET | Freq: Every day | ORAL | 0 refills | Status: DC

## 2016-09-21 NOTE — Telephone Encounter (Signed)
Oral Chemotherapy Pharmacist Encounter  Received notification from MD that patient has decided against Imbruvica treatment due to prohibitive cost.  No further needs from [facility] identified at this time. Oral Oncology Clinic will sign off. Please let us know if we can be of assistance in the future.  Johny Drilling, PharmD, BCPS, BCOP 09/21/2016  1:05 PM Oral Oncology Clinic [phone]

## 2016-09-21 NOTE — Telephone Encounter (Signed)
Scheduled appt per 8/8 los - Gave patient AVS and calender per los. 

## 2016-09-21 NOTE — Progress Notes (Signed)
START OFF PATHWAY REGIMEN - Lymphoma and CLL   [id number]:Bendamustine 90 mg/m2 D1, 2 + Rituximab (IV/Subcut) D1 q28 Days:   A cycle is every 28 days:     Bendamustine      Rituximab      Rituximab and hyaluronidase human   **Always confirm dose/schedule in your pharmacy ordering system**    Patient Characteristics: Chronic Lymphocytic Leukemia (CLL), Second Line, 17p del (-), No Prior Ibrutinib Disease Type: Chronic Lymphocytic Leukemia (CLL) Disease Type: Not Applicable Line of therapy: Second Line RAI Stage: III 17p Deletion Status: Negative Prior Ibrutinib? No Prior Ibrutinib Intent of Therapy: Non-Curative / Palliative Intent, Discussed with Patient

## 2016-09-23 ENCOUNTER — Encounter: Payer: Self-pay | Admitting: Hematology and Oncology

## 2016-09-23 NOTE — Progress Notes (Signed)
[facility] OFFICE PROGRESS NOTE  Patient Care Team: [name]., PA-C as PCP - General (Family Medicine) Burtis Junes, NP as Nurse Practitioner (Cardiology) Carol Ada, MD as Consulting Physician (Gastroenterology)  SUMMARY OF ONCOLOGIC HISTORY:   CLL (chronic lymphocytic leukemia) ([place])   06/28/2009 Imaging    1.  No evidence of aortic dissection or other acute process in the chest. 2.  Centrilobular emphysema with a 5 mm right lung nodule. Given the concurrent centrilobular emphysema, follow-up chest CT at 6 -12 months is recommended.  3.  Coronary artery atherosclerosis which is age advanced. 4.  Prominent thoracic lymph nodes.  These can be reevaluated at follow-up.      05/06/2010 Imaging    1.  Multiple small periaortic lymph nodes consistent with the patient's history of the chronic lymphocytic leukemia. 2.  No evidence of solid organ involvement      11/03/2011 Imaging    1.  Interval progression of abdominal and pelvic adenopathy. 2.  Progression of splenomegaly.  The spleen now measures 23 cm in length      11/18/2011 Bone Marrow Biopsy    Bone Marrow, Aspirate,Biopsy, and Clot, right iliac bone - HYPERCELLULAR BONE MARROW WITH EXTENSIVE INVOLVEMENT BY CHRONIC LYMPHOCYTIC LEUKEMIA. PERIPHERAL BLOOD: - CHRONIC LYMPHOCYTIC LEUKEMIA      03/08/2012 Imaging    1.  Progressive increase in retroperitoneal, iliac, and inguinal lymphadenopathy. 2.  Interval increase in massive splenomegaly.       06/01/2012 Procedure    Placement of single lumen port a cath via right internal jugular vein.  The catheter tip lies at the cavoatrial junction.  A power injectable port a cath was placed and is ready for immediate use      06/20/2012 - 11/23/2012 Chemotherapy    He received FCR x 6 cycles      12/19/2012 Imaging    Left common iliac stent. Abdominal vasculature remains patent. Improving supraclavicular and axillary lymphadenopathy. Residual right  subpectoral nodes measure up to 10 mm short axis. Improving retroperitoneal lymphadenopathy, measuring up to 16 mm short axis. Improving splenomegaly, measuring 18.7 cm.       01/20/2016 Imaging    1. Stable exam.  No new or progressive findings. 2. No CT findings to explain odynophagia      09/02/2016 Pathology Results    The findings are consistent with involvement by previously known chronic lymphocytic leukemia      09/02/2016 Pathology Results    FISH for CLL came back positive for deletion 13q      09/15/2016 Imaging    1. Borderline enlarged abdominal and pelvic lymph nodes. Compared with 11/03/2011 these are decreased in size as detailed above. 2. Persistent splenomegaly. 3. Aortic Atherosclerosis (ICD10-I70.0). LAD coronary artery calcification noted.       INTERVAL HISTORY: Please see below for problem oriented charting. He returns with his wife for further follow-up He is doing well No new lymphadenopathy Appetite is stable Denies recent fever or chills. The patient denies any recent signs or symptoms of bleeding such as spontaneous epistaxis, hematuria or hematochezia.  REVIEW OF SYSTEMS:   Constitutional: Denies fevers, chills or abnormal weight loss Eyes: Denies blurriness of vision Ears, nose, mouth, throat, and face: Denies mucositis or sore throat Respiratory: Denies cough, dyspnea or wheezes Cardiovascular: Denies palpitation, chest discomfort or lower extremity swelling Gastrointestinal:  Denies nausea, heartburn or change in bowel habits Skin: Denies abnormal skin rashes Lymphatics: Denies new lymphadenopathy or easy bruising Neurological:Denies numbness, tingling  or new weaknesses Behavioral/Psych: Mood is stable, no new changes  All other systems were reviewed with the patient and are negative.  I have reviewed the past medical history, past surgical history, social history and family history with the patient and they are unchanged from previous  note.  ALLERGIES:  is allergic to codeine.  MEDICATIONS:  Current Outpatient Prescriptions  Medication Sig Dispense Refill  . acetaminophen (TYLENOL) 500 MG tablet Take 500 mg by mouth every 6 (six) hours as needed for mild pain, moderate pain or headache.    Marland Kitchen acyclovir (ZOVIRAX) 400 MG tablet Take 1 tablet (400 mg total) by mouth daily. 90 tablet 3  . acyclovir (ZOVIRAX) 400 MG tablet Take 1 tablet (400 mg total) by mouth 2 (two) times daily. 60 tablet 3  . allopurinol (ZYLOPRIM) 300 MG tablet Take 1 tablet (300 mg total) by mouth daily. 30 tablet 0  . ALPRAZolam (XANAX) 0.25 MG tablet Take 0.25 mg by mouth as needed for anxiety.    Marland Kitchen aspirin 325 MG tablet Take 325 mg by mouth daily.      . benzonatate (TESSALON) 100 MG capsule One to two pills three times per day for cough    . cyanocobalamin (,VITAMIN B-12,) 1000 MCG/ML injection Inject 1 mL (1,000 mcg total) into the muscle every 30 (thirty) days. 10 mL 1  . esomeprazole (NEXIUM) 40 MG capsule Take 40 mg by mouth 2 (two) times daily.  0  . fluconazole (DIFLUCAN) 200 MG tablet take 2 tablets by mouth on day 1 then 1 tablet until finished  0  . HYDROcodone-acetaminophen (NORCO) 10-325 MG tablet take 1 tablet by mouth every 6 to 8 hours if needed for pain  0  . lidocaine (LIDODERM) 5 % apply patch to affected area once daily as directed for lower back pain  0  . lidocaine-prilocaine (EMLA) cream apply to affected area topically if needed 30 g 3  . lidocaine-prilocaine (EMLA) cream Apply to affected area once 30 g 3  . meclizine (ANTIVERT) 12.5 MG tablet Take 12.5 mg by mouth every 6 (six) hours as needed for dizziness.   0  . metFORMIN (GLUCOPHAGE-XR) 500 MG 24 hr tablet Take 500 mg by mouth 2 (two) times daily.    . metoprolol succinate (TOPROL XL) 25 MG 24 hr tablet Take 1 tablet (25 mg total) by mouth daily. 90 tablet 3  . nitroGLYCERIN (NITROSTAT) 0.4 MG SL tablet place 1 tablet under the tongue if needed every 5 minutes for chest pain  25 tablet 3  . ondansetron (ZOFRAN) 8 MG tablet Take 1 tablet (8 mg total) by mouth 2 (two) times daily as needed for refractory nausea / vomiting. Start on day 2 after bendamustine 30 tablet 1  . predniSONE (DELTASONE) 20 MG tablet Take 1 tablet (20 mg total) by mouth daily with breakfast. 7 tablet 0  . predniSONE (DELTASONE) 20 MG tablet Take 3 tablets (60 mg total) by mouth daily with breakfast. 12 tablet 0  . pregabalin (LYRICA) 100 MG capsule 1 cap three times daily and 2 caps at hs 450 capsule 1  . prochlorperazine (COMPAZINE) 10 MG tablet Take 1 tablet (10 mg total) by mouth every 6 (six) hours as needed (Nausea or vomiting). 30 tablet 1  . rosuvastatin (CRESTOR) 5 MG tablet Take 1 tablet (5 mg total) by mouth daily. 90 tablet 3  . tetrahydrozoline 0.05 % ophthalmic solution Place 1 drop into both eyes as needed. Red eyes    . topiramate (  TOPAMAX) 25 MG tablet 3 tabs twice daily if necessary 540 tablet 2  . zolpidem (AMBIEN) 10 MG tablet Take 10 mg by mouth at bedtime as needed for sleep.   0   No current facility-administered medications for this visit.    Facility-Administered Medications Ordered in Other Visits  Medication Dose Route Frequency Provider Last Rate Last Dose  . 0.9 %  sodium chloride infusion   Intravenous Continuous Alvy Bimler, Gaby Harney, MD 50 mL/hr at 03/07/14 1005    . sodium chloride 0.9 % injection 10 mL  10 mL Intracatheter PRN Marcy Panning, MD   10 mL at 08/22/12 1721    PHYSICAL EXAMINATION: ECOG PERFORMANCE STATUS: 1 - Symptomatic but completely ambulatory  Vitals:   09/21/16 1221  BP: 129/67  Pulse: (!) 55  Resp: 17  Temp: 97.8 F (36.6 C)  SpO2: 97%   Filed Weights   09/21/16 1221  Weight: 230 lb (104.3 kg)    GENERAL:alert, no distress and comfortable SKIN: skin color, texture, turgor are normal, no rashes or significant lesions EYES: normal, Conjunctiva are pink and non-injected, sclera clear OROPHARYNX:no exudate, no erythema and lips, buccal  mucosa, and tongue normal  NECK: supple, thyroid normal size, non-tender, without nodularity LYMPH:  no palpable lymphadenopathy in the cervical, axillary or inguinal LUNGS: clear to auscultation and percussion with normal breathing effort HEART: regular rate & rhythm and no murmurs and no lower extremity edema ABDOMEN:abdomen soft, non-tender and normal bowel sounds Musculoskeletal:no cyanosis of digits and no clubbing  NEURO: alert & oriented x 3 with fluent speech, no focal motor/sensory deficits  LABORATORY DATA:  I have reviewed the data as listed    Component Value Date/Time   NA 139 09/02/2016 0922   K 5.1 09/02/2016 0922   CL 101 05/24/2016 0848   CL 104 08/03/2012 1229   CO2 24 09/02/2016 0922   GLUCOSE 182 (H) 09/02/2016 0922   GLUCOSE 223 (H) 08/03/2012 1229   BUN 12.0 09/02/2016 0922   CREATININE 1.1 09/02/2016 0922   CALCIUM 9.0 09/02/2016 0922   PROT 6.4 09/02/2016 0922   ALBUMIN 3.8 09/02/2016 0922   AST 17 09/02/2016 0922   ALT 16 09/02/2016 0922   ALKPHOS 64 09/02/2016 0922   BILITOT 0.43 09/02/2016 0922   GFRNONAA 82 05/24/2016 0848   GFRAA 94 05/24/2016 0848    No results found for: SPEP, UPEP  Lab Results  Component Value Date   WBC 11.8 (H) 09/02/2016   NEUTROABS 2.6 09/02/2016   HGB 15.0 09/02/2016   HCT 43.3 09/02/2016   MCV 95.2 09/02/2016   PLT 60 (L) 09/02/2016      Chemistry      Component Value Date/Time   NA 139 09/02/2016 0922   K 5.1 09/02/2016 0922   CL 101 05/24/2016 0848   CL 104 08/03/2012 1229   CO2 24 09/02/2016 0922   BUN 12.0 09/02/2016 0922   CREATININE 1.1 09/02/2016 0922      Component Value Date/Time   CALCIUM 9.0 09/02/2016 0922   ALKPHOS 64 09/02/2016 0922   AST 17 09/02/2016 0922   ALT 16 09/02/2016 0922   BILITOT 0.43 09/02/2016 0922       RADIOGRAPHIC STUDIES: I have personally reviewed the radiological images as listed and agreed with the findings in the report. Ct Chest W Contrast  Result Date:  09/15/2016 CLINICAL DATA:  Staging lymphoma. EXAM: CT CHEST, ABDOMEN, AND PELVIS WITH CONTRAST TECHNIQUE: Multidetector CT imaging of the chest, abdomen and pelvis was  performed following the standard protocol during bolus administration of intravenous contrast. CONTRAST:  168m ISOVUE-300 IOPAMIDOL (ISOVUE-300) INJECTION 61% COMPARISON:  CT chest from 01/20/2016 and CT abdomen pelvis from 11/03/2011 FINDINGS: CT CHEST FINDINGS Cardiovascular: Heart size is normal. No pericardial effusion. Aortic atherosclerosis. Calcification in the LAD coronary artery noted. Mediastinum/Nodes: No enlarged mediastinal, hilar, or axillary lymph nodes. Thyroid gland, trachea, and esophagus demonstrate no significant findings. Lungs/Pleura: No pleural effusion identified. Mild changes of centrilobular emphysema. Calcified granuloma identified in the right midlung. Musculoskeletal: No aggressive lytic or sclerotic bone lesions identified. CT ABDOMEN PELVIS FINDINGS Hepatobiliary: No focal liver abnormality. Previous cholecystectomy. No biliary dilatation. Pancreas: Unremarkable. No pancreatic ductal dilatation or surrounding inflammatory changes. Spleen: The spleen is enlarged measuring at 20 cm in length. Previously 23 cm. Adrenals/Urinary Tract: Normal adrenal glands. Unremarkable appearance of the kidneys. Urinary bladder appears normal. Stomach/Bowel: Stomach is within normal limits. Appendix appears normal. No evidence of bowel wall thickening, distention, or inflammatory changes. Vascular/Lymphatic: Aortic atherosclerosis. No aneurysm. Stent is identified within the left common iliac artery.Portahepatis node measures 1.1 cm, image 57 series 2. Previously, 1.5 cm. Portahepatis node measures 0.9 cm, image 64 series 2. Aortocaval node measures 1.1 cm, image 69 series 2. Previously 1.3. Left external iliac node measures 0.7 cm, image 111 series 2. Previously, this measured 1.3 cm. Reproductive:  Prostate gland is unremarkable. Other:  No abdominal wall hernia or abnormality. No abdominopelvic ascites. Musculoskeletal: Uterus and bilateral adnexa are unremarkable. IMPRESSION: 1. Borderline enlarged abdominal and pelvic lymph nodes. Compared with 11/03/2011 these are decreased in size as detailed above. 2. Persistent splenomegaly. 3. Aortic Atherosclerosis (ICD10-I70.0). LAD coronary artery calcification noted. Electronically Signed   By: TKerby MoorsM.D.   On: 09/15/2016 16:49   Ct Abdomen Pelvis W Contrast  Result Date: 09/15/2016 CLINICAL DATA:  Staging lymphoma. EXAM: CT CHEST, ABDOMEN, AND PELVIS WITH CONTRAST TECHNIQUE: Multidetector CT imaging of the chest, abdomen and pelvis was performed following the standard protocol during bolus administration of intravenous contrast. CONTRAST:  1032mISOVUE-300 IOPAMIDOL (ISOVUE-300) INJECTION 61% COMPARISON:  CT chest from 01/20/2016 and CT abdomen pelvis from 11/03/2011 FINDINGS: CT CHEST FINDINGS Cardiovascular: Heart size is normal. No pericardial effusion. Aortic atherosclerosis. Calcification in the LAD coronary artery noted. Mediastinum/Nodes: No enlarged mediastinal, hilar, or axillary lymph nodes. Thyroid gland, trachea, and esophagus demonstrate no significant findings. Lungs/Pleura: No pleural effusion identified. Mild changes of centrilobular emphysema. Calcified granuloma identified in the right midlung. Musculoskeletal: No aggressive lytic or sclerotic bone lesions identified. CT ABDOMEN PELVIS FINDINGS Hepatobiliary: No focal liver abnormality. Previous cholecystectomy. No biliary dilatation. Pancreas: Unremarkable. No pancreatic ductal dilatation or surrounding inflammatory changes. Spleen: The spleen is enlarged measuring at 20 cm in length. Previously 23 cm. Adrenals/Urinary Tract: Normal adrenal glands. Unremarkable appearance of the kidneys. Urinary bladder appears normal. Stomach/Bowel: Stomach is within normal limits. Appendix appears normal. No evidence of bowel wall  thickening, distention, or inflammatory changes. Vascular/Lymphatic: Aortic atherosclerosis. No aneurysm. Stent is identified within the left common iliac artery.Portahepatis node measures 1.1 cm, image 57 series 2. Previously, 1.5 cm. Portahepatis node measures 0.9 cm, image 64 series 2. Aortocaval node measures 1.1 cm, image 69 series 2. Previously 1.3. Left external iliac node measures 0.7 cm, image 111 series 2. Previously, this measured 1.3 cm. Reproductive:  Prostate gland is unremarkable. Other: No abdominal wall hernia or abnormality. No abdominopelvic ascites. Musculoskeletal: Uterus and bilateral adnexa are unremarkable. IMPRESSION: 1. Borderline enlarged abdominal and pelvic lymph nodes. Compared with 11/03/2011 these are decreased  in size as detailed above. 2. Persistent splenomegaly. 3. Aortic Atherosclerosis (ICD10-I70.0). LAD coronary artery calcification noted. Electronically Signed   By: Kerby Moors M.D.   On: 09/15/2016 16:49    ASSESSMENT & PLAN:  CLL (chronic lymphocytic leukemia) We discussed the role of chemotherapy is of curative intent The decision was made based on publication in the Blood: Randomized trial of bendamustine-rituximab or R-CHOP/R-CVP in first-line treatment of indolent NHL or MCL: the BRIGHT study.  [place] [address] Lucianne Lei der [address], [place] BS, [address] M, Kwan YL, Simpson D, Craig M, Kolibaba K, Issa S, [place], [place] DM, Munteanu M, Aram Beecham JM SO  Blood. 2014;123(19):2944.  The chemotherapy consists of   1. Bendamustine at 90 mg/m2 on day 1 & 2 2. Rituximab at 375 mg/m2 on day 1 Each cycle + 28 days  Plan for 6 cycles total with or without Neulasta support  In the international phase III BRIGHT trial, 447 previously untreated patients with advanced stage follicular (n = 151 patients), mantle cell (n = 74 patients), or other indolent lymphoma were randomly assigned to six cycles of BR according to the same dose and  schedule described above or to R-CHOP or R-CVP. BR resulted in similar complete (31 versus 25 percent) and overall (97 versus 91 percent) response rates. BR was associated with higher rates of vomiting and drug hypersensitivity and lower rates of peripheral neuropathy/paresthesia and alopecia. The use of prophylactic antiemetics was not specified in the protocol and was more common among patients assigned to R-CHOP.   We discussed some of the risks, benefits and side-effects of Rituximab with Bendamustine.   Some of the short term side-effects included, though not limited to, risk of fatigue, weight loss, tumor lysis syndrome, risk of allergic reactions, pancytopenia, life-threatening infections, need for transfusions of blood products, nausea, vomiting, change in bowel habits, admission to hospital for various reasons, and risks of death.   Long term side-effects are also discussed including permanent damage to nerve function, chronic fatigue, and rare secondary malignancy including bone marrow disorders.   The patient is aware that the response rates discussed earlier is not guaranteed.    After a long discussion, patient made an informed decision to proceed with the prescribed plan of care.   Patient education material was dispensed To reduce the risk of infusion reaction, I will premedicate with prednisone for several days before each treatment We also discussed potential treatment with ibrutinib but the cost is too prohibitive for him to use oral chemo for now To reduce the risk of tumor lysis syndrome, he is instructed to take allopurinol     Orders Placed This Encounter  Procedures  . CBC with Differential    Standing Status:   Standing    Number of Occurrences:   20    Standing Expiration Date:   09/22/2017  . Comprehensive metabolic panel    Standing Status:   Standing    Number of Occurrences:   20    Standing Expiration Date:   09/22/2017  . Hepatitis B core antibody, IgM     Standing Status:   Future    Standing Expiration Date:   10/26/2017  . Hepatitis B surface antibody    Standing Status:   Future    Standing Expiration Date:   10/26/2017  . Hepatitis B surface antigen    Standing Status:   Future    Standing Expiration Date:   10/26/2017  .  Uric acid    Standing Status:   Future    Standing Expiration Date:   10/26/2017  . Uric acid    Standing Status:   Future    Standing Expiration Date:   09/23/2017   All questions were answered. The patient knows to call the clinic with any problems, questions or concerns. No barriers to learning was detected. I spent 25 minutes counseling the patient face to face. The total time spent in the appointment was 40 minutes and more than 50% was on counseling and review of test results     [name], MD 09/23/2016 3:41 PM

## 2016-09-23 NOTE — Assessment & Plan Note (Signed)
We discussed the role of chemotherapy is of curative intent The decision was made based on publication in the Blood: Randomized trial of bendamustine-rituximab or R-CHOP/R-CVP in first-line treatment of indolent NHL or MCL: the BRIGHT study.  [address] Lucianne Lei der [address], Raymond BS, [address] M, Kwan YL, Simpson D, Craig M, Kolibaba K, Issa S, [place], [place] DM, Munteanu M, Aram Beecham JM SO  Blood. 2014;123(19):2944.  The chemotherapy consists of   1. Bendamustine at 90 mg/m2 on day 1 & 2 2. Rituximab at 375 mg/m2 on day 1 Each cycle + 28 days  Plan for 6 cycles total with or without Neulasta support  In the international phase III BRIGHT trial, 447 previously untreated patients with advanced stage follicular (n = 606 patients), mantle cell (n = 74 patients), or other indolent lymphoma were randomly assigned to six cycles of BR according to the same dose and schedule described above or to R-CHOP or R-CVP. BR resulted in similar complete (31 versus 25 percent) and overall (97 versus 91 percent) response rates. BR was associated with higher rates of vomiting and drug hypersensitivity and lower rates of peripheral neuropathy/paresthesia and alopecia. The use of prophylactic antiemetics was not specified in the protocol and was more common among patients assigned to R-CHOP.   We discussed some of the risks, benefits and side-effects of Rituximab with Bendamustine.   Some of the short term side-effects included, though not limited to, risk of fatigue, weight loss, tumor lysis syndrome, risk of allergic reactions, pancytopenia, life-threatening infections, need for transfusions of blood products, nausea, vomiting, change in bowel habits, admission to hospital for various reasons, and risks of death.   Long term side-effects are also discussed including permanent damage to nerve function, chronic fatigue, and rare secondary malignancy including bone marrow disorders.    The patient is aware that the response rates discussed earlier is not guaranteed.    After a long discussion, patient made an informed decision to proceed with the prescribed plan of care.   Patient education material was dispensed To reduce the risk of infusion reaction, I will premedicate with prednisone for several days before each treatment We also discussed potential treatment with ibrutinib but the cost is too prohibitive for him to use oral chemo for now To reduce the risk of tumor lysis syndrome, he is instructed to take allopurinol

## 2016-10-03 ENCOUNTER — Other Ambulatory Visit: Payer: 59

## 2016-10-03 ENCOUNTER — Other Ambulatory Visit: Payer: Self-pay | Admitting: Hematology and Oncology

## 2016-10-03 ENCOUNTER — Other Ambulatory Visit (HOSPITAL_BASED_OUTPATIENT_CLINIC_OR_DEPARTMENT_OTHER): Payer: 59

## 2016-10-03 DIAGNOSIS — C9190 Lymphoid leukemia, unspecified not having achieved remission: Secondary | ICD-10-CM

## 2016-10-03 DIAGNOSIS — C9110 Chronic lymphocytic leukemia of B-cell type not having achieved remission: Secondary | ICD-10-CM

## 2016-10-03 LAB — CBC WITH DIFFERENTIAL/PLATELET
BASO%: 0.3 % (ref 0.0–2.0)
BASOS ABS: 0.1 10*3/uL (ref 0.0–0.1)
EOS%: 0.1 % (ref 0.0–7.0)
Eosinophils Absolute: 0 10*3/uL (ref 0.0–0.5)
HCT: 46.2 % (ref 38.4–49.9)
HEMOGLOBIN: 15.6 g/dL (ref 13.0–17.1)
LYMPH%: 79.2 % — ABNORMAL HIGH (ref 14.0–49.0)
MCH: 32.5 pg (ref 27.2–33.4)
MCHC: 33.7 g/dL (ref 32.0–36.0)
MCV: 96.3 fL (ref 79.3–98.0)
MONO#: 0.5 10*3/uL (ref 0.1–0.9)
MONO%: 2.3 % (ref 0.0–14.0)
NEUT%: 18.1 % — ABNORMAL LOW (ref 39.0–75.0)
NEUTROS ABS: 4 10*3/uL (ref 1.5–6.5)
Platelets: 77 10*3/uL — ABNORMAL LOW (ref 140–400)
RBC: 4.8 10*6/uL (ref 4.20–5.82)
RDW: 15.8 % — AB (ref 11.0–14.6)
WBC: 22.1 10*3/uL — AB (ref 4.0–10.3)
lymph#: 17.5 10*3/uL — ABNORMAL HIGH (ref 0.9–3.3)

## 2016-10-03 LAB — COMPREHENSIVE METABOLIC PANEL
ALBUMIN: 3.8 g/dL (ref 3.5–5.0)
ALK PHOS: 81 U/L (ref 40–150)
ALT: 16 U/L (ref 0–55)
AST: 19 U/L (ref 5–34)
Anion Gap: 6 mEq/L (ref 3–11)
BUN: 10.1 mg/dL (ref 7.0–26.0)
CO2: 29 mEq/L (ref 22–29)
Calcium: 9.9 mg/dL (ref 8.4–10.4)
Chloride: 104 mEq/L (ref 98–109)
Creatinine: 1.1 mg/dL (ref 0.7–1.3)
EGFR: 71 mL/min/{1.73_m2} — ABNORMAL LOW (ref >90)
GLUCOSE: 140 mg/dL (ref 70–140)
POTASSIUM: 4.5 meq/L (ref 3.5–5.1)
SODIUM: 139 meq/L (ref 136–145)
Total Bilirubin: 0.47 mg/dL (ref 0.20–1.20)
Total Protein: 6.6 g/dL (ref 6.4–8.3)

## 2016-10-03 LAB — URIC ACID: URIC ACID, SERUM: 7.8 mg/dL — AB (ref 2.6–7.4)

## 2016-10-03 LAB — TECHNOLOGIST REVIEW

## 2016-10-04 LAB — HEPATITIS B SURFACE ANTIBODY,QUALITATIVE: HEP B SURFACE AB, QUAL: REACTIVE

## 2016-10-04 LAB — HEPATITIS B SURFACE ANTIGEN: HBsAg Screen: NEGATIVE

## 2016-10-04 LAB — HEPATITIS B CORE ANTIBODY, IGM: HEP B C IGM: NEGATIVE

## 2016-10-05 ENCOUNTER — Encounter: Payer: Self-pay | Admitting: Hematology and Oncology

## 2016-10-05 ENCOUNTER — Other Ambulatory Visit: Payer: Self-pay | Admitting: Hematology and Oncology

## 2016-10-05 MED ORDER — AMOXICILLIN-POT CLAVULANATE 875-125 MG PO TABS: 1.0000 | ORAL_TABLET | Freq: Two times a day (BID) | ORAL | 0 refills | Status: DC

## 2016-10-06 ENCOUNTER — Ambulatory Visit (HOSPITAL_BASED_OUTPATIENT_CLINIC_OR_DEPARTMENT_OTHER): Payer: 59

## 2016-10-06 VITALS — BP 111/60 | HR 57 | Temp 98.0°F | Resp 18

## 2016-10-06 DIAGNOSIS — Z5111 Encounter for antineoplastic chemotherapy: Secondary | ICD-10-CM | POA: Diagnosis not present

## 2016-10-06 DIAGNOSIS — Z5112 Encounter for antineoplastic immunotherapy: Secondary | ICD-10-CM

## 2016-10-06 DIAGNOSIS — C9110 Chronic lymphocytic leukemia of B-cell type not having achieved remission: Secondary | ICD-10-CM

## 2016-10-06 DIAGNOSIS — C9190 Lymphoid leukemia, unspecified not having achieved remission: Secondary | ICD-10-CM | POA: Diagnosis not present

## 2016-10-06 MED ORDER — DIPHENHYDRAMINE HCL 25 MG PO CAPS
50.0000 mg | ORAL_CAPSULE | Freq: Once | ORAL | Status: AC
  Administered 2016-10-06: 50 mg via ORAL

## 2016-10-06 MED ORDER — PALONOSETRON HCL INJECTION 0.25 MG/5ML
INTRAVENOUS | Status: AC
  Filled 2016-10-06: qty 5

## 2016-10-06 MED ORDER — FAMOTIDINE IN NACL 20-0.9 MG/50ML-% IV SOLN
INTRAVENOUS | Status: AC
  Filled 2016-10-06: qty 50

## 2016-10-06 MED ORDER — SODIUM CHLORIDE 0.9 % IV SOLN
375.0000 mg/m2 | Freq: Once | INTRAVENOUS | Status: AC
  Administered 2016-10-06: 900 mg via INTRAVENOUS
  Filled 2016-10-06: qty 50

## 2016-10-06 MED ORDER — DEXAMETHASONE SODIUM PHOSPHATE 10 MG/ML IJ SOLN
10.0000 mg | Freq: Once | INTRAMUSCULAR | Status: AC
  Administered 2016-10-06: 10 mg via INTRAVENOUS

## 2016-10-06 MED ORDER — SODIUM CHLORIDE 0.9 % IV SOLN
Freq: Once | INTRAVENOUS | Status: AC
  Administered 2016-10-06: 09:00:00 via INTRAVENOUS

## 2016-10-06 MED ORDER — SODIUM CHLORIDE 0.9 % IV SOLN
90.0000 mg/m2 | Freq: Once | INTRAVENOUS | Status: AC
  Administered 2016-10-06: 200 mg via INTRAVENOUS
  Filled 2016-10-06: qty 8

## 2016-10-06 MED ORDER — HEPARIN SOD (PORK) LOCK FLUSH 100 UNIT/ML IV SOLN
500.0000 [IU] | Freq: Once | INTRAVENOUS | Status: AC | PRN
  Administered 2016-10-06: 500 [IU]
  Filled 2016-10-06: qty 5

## 2016-10-06 MED ORDER — DIPHENHYDRAMINE HCL 25 MG PO CAPS
ORAL_CAPSULE | ORAL | Status: AC
  Filled 2016-10-06: qty 2

## 2016-10-06 MED ORDER — DEXAMETHASONE SODIUM PHOSPHATE 10 MG/ML IJ SOLN
INTRAMUSCULAR | Status: AC
  Filled 2016-10-06: qty 1

## 2016-10-06 MED ORDER — ACETAMINOPHEN 325 MG PO TABS
650.0000 mg | ORAL_TABLET | Freq: Once | ORAL | Status: AC
  Administered 2016-10-06: 650 mg via ORAL

## 2016-10-06 MED ORDER — ACETAMINOPHEN 325 MG PO TABS
ORAL_TABLET | ORAL | Status: AC
  Filled 2016-10-06: qty 2

## 2016-10-06 MED ORDER — SODIUM CHLORIDE 0.9% FLUSH
10.0000 mL | INTRAVENOUS | Status: DC | PRN
  Administered 2016-10-06: 10 mL
  Filled 2016-10-06: qty 10

## 2016-10-06 MED ORDER — PALONOSETRON HCL INJECTION 0.25 MG/5ML
0.2500 mg | Freq: Once | INTRAVENOUS | Status: AC
  Administered 2016-10-06: 0.25 mg via INTRAVENOUS

## 2016-10-06 MED ORDER — FAMOTIDINE IN NACL 20-0.9 MG/50ML-% IV SOLN
20.0000 mg | Freq: Once | INTRAVENOUS | Status: AC
  Administered 2016-10-06: 20 mg via INTRAVENOUS

## 2016-10-06 NOTE — Patient Instructions (Addendum)
[facility] Discharge Instructions for Patients Receiving Chemotherapy  Today you received the following chemotherapy agents: Rituxan and Bendamustine.  To help prevent nausea and vomiting after your treatment, we encourage you to take your nausea medication as directed. May start Zofran 3 days after chemo.  Rituximab injection What is this medicine? RITUXIMAB (ri TUX i mab) is a monoclonal antibody. It is used to treat certain types of cancer like non-Hodgkin lymphoma and chronic lymphocytic leukemia. It is also used to treat rheumatoid arthritis, granulomatosis with polyangiitis (or Wegener's granulomatosis), and microscopic polyangiitis. This medicine may be used for other purposes; ask your health care provider or pharmacist if you have questions. COMMON BRAND NAME(S): Rituxan What should I tell my health care provider before I take this medicine? They need to know if you have any of these conditions: -heart disease -infection (especially a virus infection such as hepatitis B, chickenpox, cold sores, or herpes) -immune system problems -irregular heartbeat -kidney disease -lung or breathing disease, like asthma -recently received or scheduled to receive a vaccine -an unusual or allergic reaction to rituximab, mouse proteins, other medicines, foods, dyes, or preservatives -pregnant or trying to get pregnant -breast-feeding How should I use this medicine? This medicine is for infusion into a vein. It is administered in a hospital or clinic by a specially trained health care professional. A special MedGuide will be given to you by the pharmacist with each prescription and refill. Be sure to read this information carefully each time. Talk to your pediatrician regarding the use of this medicine in children. This medicine is not approved for use in children. Overdosage: If you think you have taken too much of this medicine contact a poison control center or emergency room at  once. NOTE: This medicine is only for you. Do not share this medicine with others. What if I miss a dose? It is important not to miss a dose. Call your doctor or health care professional if you are unable to keep an appointment. What may interact with this medicine? -cisplatin -other medicines for arthritis like disease modifying antirheumatic drugs or tumor necrosis factor inhibitors -live virus vaccines This list may not describe all possible interactions. Give your health care provider a list of all the medicines, herbs, non-prescription drugs, or dietary supplements you use. Also tell them if you smoke, drink alcohol, or use illegal drugs. Some items may interact with your medicine. What should I watch for while using this medicine? Your condition will be monitored carefully while you are receiving this medicine. You may need blood work done while you are taking this medicine. This medicine can cause serious allergic reactions. To reduce your risk you may need to take medicine before treatment with this medicine. Take your medicine as directed. In some patients, this medicine may cause a serious brain infection that may cause death. If you have any problems seeing, thinking, speaking, walking, or standing, tell your doctor right away. If you cannot reach your doctor, urgently seek other source of medical care. Call your doctor or health care professional for advice if you get a fever, chills or sore throat, or other symptoms of a cold or flu. Do not treat yourself. This drug decreases your body's ability to fight infections. Try to avoid being around people who are sick. Do not become pregnant while taking this medicine or for 12 months after stopping it. Women should inform their doctor if they wish to become pregnant or think they might be pregnant. There is  a potential for serious side effects to an unborn child. Talk to your health care professional or pharmacist for more information. What  side effects may I notice from receiving this medicine? Side effects that you should report to your doctor or health care professional as soon as possible: -breathing problems -chest pain -dizziness or feeling faint -fast, irregular heartbeat -low blood counts - this medicine may decrease the number of white blood cells, red blood cells and platelets. You may be at increased risk for infections and bleeding. -mouth sores -redness, blistering, peeling or loosening of the skin, including inside the mouth (this can be added for any serious or exfoliative rash that could lead to hospitalization) -signs of infection - fever or chills, cough, sore throat, pain or difficulty passing urine -signs and symptoms of kidney injury like trouble passing urine or change in the amount of urine -signs and symptoms of liver injury like dark yellow or brown urine; general ill feeling or flu-like symptoms; light-colored stools; loss of appetite; nausea; right upper belly pain; unusually weak or tired; yellowing of the eyes or skin -stomach pain -vomiting Side effects that usually do not require medical attention (report to your doctor or health care professional if they continue or are bothersome): -headache -joint pain -muscle cramps or muscle pain This list may not describe all possible side effects. Call your doctor for medical advice about side effects. You may report side effects to FDA at 1-800-FDA-1088. Where should I keep my medicine? This drug is given in a hospital or clinic and will not be stored at home. NOTE: This sheet is a summary. It may not cover all possible information. If you have questions about this medicine, talk to your doctor, pharmacist, or health care provider.  2018 Elsevier/Gold Standard (2015-09-09 15:28:09)    If you develop nausea and vomiting that is not controlled by your nausea medication, call the clinic.   BELOW ARE SYMPTOMS THAT SHOULD BE REPORTED IMMEDIATELY:  *FEVER  GREATER THAN 100.5 F  *CHILLS WITH OR WITHOUT FEVER  NAUSEA AND VOMITING THAT IS NOT CONTROLLED WITH YOUR NAUSEA MEDICATION  *UNUSUAL SHORTNESS OF BREATH  *UNUSUAL BRUISING OR BLEEDING  TENDERNESS IN MOUTH AND THROAT WITH OR WITHOUT PRESENCE OF ULCERS  *URINARY PROBLEMS  *BOWEL PROBLEMS  UNUSUAL RASH Items with * indicate a potential emergency and should be followed up as soon as possible.  Feel free to call the clinic you have any questions or concerns. The clinic phone number is [phone].  Please show the [facility] at check-in to the Emergency Department and triage nurse.  Bendamustine Injection What is this medicine? BENDAMUSTINE (BEN da MUS teen) is a chemotherapy drug. It is used to treat chronic lymphocytic leukemia and non-Hodgkin lymphoma. This medicine may be used for other purposes; ask your health care provider or pharmacist if you have questions. COMMON BRAND NAME(S): BENDEKA, Treanda What should I tell my health care provider before I take this medicine? They need to know if you have any of these conditions: -infection (especially a virus infection such as chickenpox, cold sores, or herpes) -kidney disease -liver disease -an unusual or allergic reaction to bendamustine, mannitol, other medicines, foods, dyes, or preservatives -pregnant or trying to get pregnant -breast-feeding How should I use this medicine? This medicine is for infusion into a vein. It is given by a health care professional in a hospital or clinic setting. Talk to your pediatrician regarding the use of this medicine in children. Special care may be  needed. Overdosage: If you think you have taken too much of this medicine contact a poison control center or emergency room at once. NOTE: This medicine is only for you. Do not share this medicine with others. What if I miss a dose? It is important not to miss your dose. Call your doctor or health care professional if you are unable to  keep an appointment. What may interact with this medicine? Do not take this medicine with any of the following medications: -clozapine This medicine may also interact with the following medications: -atazanavir -cimetidine -ciprofloxacin -enoxacin -fluvoxamine -medicines for seizures like carbamazepine and phenobarbital -mexiletine -rifampin -tacrine -thiabendazole -zileuton This list may not describe all possible interactions. Give your health care provider a list of all the medicines, herbs, non-prescription drugs, or dietary supplements you use. Also tell them if you smoke, drink alcohol, or use illegal drugs. Some items may interact with your medicine. What should I watch for while using this medicine? This drug may make you feel generally unwell. This is not uncommon, as chemotherapy can affect healthy cells as well as cancer cells. Report any side effects. Continue your course of treatment even though you feel ill unless your doctor tells you to stop. You may need blood work done while you are taking this medicine. Call your doctor or health care professional for advice if you get a fever, chills or sore throat, or other symptoms of a cold or flu. Do not treat yourself. This drug decreases your body's ability to fight infections. Try to avoid being around people who are sick. This medicine may increase your risk to bruise or bleed. Call your doctor or health care professional if you notice any unusual bleeding. Talk to your doctor about your risk of cancer. You may be more at risk for certain types of cancers if you take this medicine. Do not become pregnant while taking this medicine or for 3 months after stopping it. Women should inform their doctor if they wish to become pregnant or think they might be pregnant. Men should not father a child while taking this medicine and for 3 months after stopping it.There is a potential for serious side effects to an unborn child. Talk to your  health care professional or pharmacist for more information. Do not breast-feed an infant while taking this medicine. This medicine may interfere with the ability to have a child. You should talk with your doctor or health care professional if you are concerned about your fertility. What side effects may I notice from receiving this medicine? Side effects that you should report to your doctor or health care professional as soon as possible: -allergic reactions like skin rash, itching or hives, swelling of the face, lips, or tongue -low blood counts - this medicine may decrease the number of white blood cells, red blood cells and platelets. You may be at increased risk for infections and bleeding. -redness, blistering, peeling or loosening of the skin, including inside the mouth -signs of infection - fever or chills, cough, sore throat, pain or difficulty passing urine -signs of decreased platelets or bleeding - bruising, pinpoint red spots on the skin, black, tarry stools, blood in the urine -signs of decreased red blood cells - unusually weak or tired, fainting spells, lightheadedness -signs and symptoms of kidney injury like trouble passing urine or change in the amount of urine -signs and symptoms of liver injury like dark yellow or brown urine; general ill feeling or flu-like symptoms; light-colored stools; loss of appetite;  nausea; right upper belly pain; unusually weak or tired; yellowing of the eyes or skin Side effects that usually do not require medical attention (report to your doctor or health care professional if they continue or are bothersome): -constipation -decreased appetite -diarrhea -headache -mouth sores -nausea/vomiting -tiredness This list may not describe all possible side effects. Call your doctor for medical advice about side effects. You may report side effects to FDA at 1-800-FDA-1088. Where should I keep my medicine? This drug is given in a hospital or clinic and  will not be stored at home. NOTE: This sheet is a summary. It may not cover all possible information. If you have questions about this medicine, talk to your doctor, pharmacist, or health care provider.  2018 Elsevier/Gold Standard (2014-12-04 08:45:41)

## 2016-10-07 ENCOUNTER — Ambulatory Visit (HOSPITAL_BASED_OUTPATIENT_CLINIC_OR_DEPARTMENT_OTHER): Payer: 59

## 2016-10-07 VITALS — BP 143/67 | HR 55 | Temp 97.8°F | Resp 18

## 2016-10-07 DIAGNOSIS — Z5111 Encounter for antineoplastic chemotherapy: Secondary | ICD-10-CM

## 2016-10-07 DIAGNOSIS — C9190 Lymphoid leukemia, unspecified not having achieved remission: Secondary | ICD-10-CM

## 2016-10-07 DIAGNOSIS — C9110 Chronic lymphocytic leukemia of B-cell type not having achieved remission: Secondary | ICD-10-CM

## 2016-10-07 MED ORDER — DEXAMETHASONE SODIUM PHOSPHATE 10 MG/ML IJ SOLN
10.0000 mg | Freq: Once | INTRAMUSCULAR | Status: AC
  Administered 2016-10-07: 10 mg via INTRAVENOUS

## 2016-10-07 MED ORDER — SODIUM CHLORIDE 0.9 % IV SOLN
3.0000 mg | Freq: Once | INTRAVENOUS | Status: AC
  Administered 2016-10-07: 3 mg via INTRAVENOUS
  Filled 2016-10-07: qty 2

## 2016-10-07 MED ORDER — SODIUM CHLORIDE 0.9% FLUSH
10.0000 mL | INTRAVENOUS | Status: DC | PRN
  Administered 2016-10-07: 10 mL
  Filled 2016-10-07: qty 10

## 2016-10-07 MED ORDER — SODIUM CHLORIDE 0.9 % IV SOLN
Freq: Once | INTRAVENOUS | Status: AC
  Administered 2016-10-07: 08:00:00 via INTRAVENOUS

## 2016-10-07 MED ORDER — HEPARIN SOD (PORK) LOCK FLUSH 100 UNIT/ML IV SOLN
500.0000 [IU] | Freq: Once | INTRAVENOUS | Status: AC | PRN
  Administered 2016-10-07: 500 [IU]
  Filled 2016-10-07: qty 5

## 2016-10-07 MED ORDER — SODIUM CHLORIDE 0.9 % IV SOLN
90.0000 mg/m2 | Freq: Once | INTRAVENOUS | Status: AC
  Administered 2016-10-07: 200 mg via INTRAVENOUS
  Filled 2016-10-07: qty 8

## 2016-10-07 MED ORDER — DEXAMETHASONE SODIUM PHOSPHATE 10 MG/ML IJ SOLN
INTRAMUSCULAR | Status: AC
  Filled 2016-10-07: qty 1

## 2016-10-07 NOTE — Patient Instructions (Signed)
[facility] Discharge Instructions for Patients Receiving Chemotherapy  Today you received the following chemotherapy agents Bendeka  To help prevent nausea and vomiting after your treatment, we encourage you to take your nausea medication as directed If you develop nausea and vomiting that is not controlled by your nausea medication, call the clinic.   BELOW ARE SYMPTOMS THAT SHOULD BE REPORTED IMMEDIATELY:  *FEVER GREATER THAN 100.5 F  *CHILLS WITH OR WITHOUT FEVER  NAUSEA AND VOMITING THAT IS NOT CONTROLLED WITH YOUR NAUSEA MEDICATION  *UNUSUAL SHORTNESS OF BREATH  *UNUSUAL BRUISING OR BLEEDING  TENDERNESS IN MOUTH AND THROAT WITH OR WITHOUT PRESENCE OF ULCERS  *URINARY PROBLEMS  *BOWEL PROBLEMS  UNUSUAL RASH Items with * indicate a potential emergency and should be followed up as soon as possible.  Feel free to call the clinic you have any questions or concerns. The clinic phone number is [phone].  Please show the [name] at check-in to the Emergency Department and triage nurse.  Rasburicase Injection What is this medicine? RASBURICASE (ras BURE i kase) breaks down uric acid in the blood. It is used to prevent and to treat high levels of uric acid caused by cancer treatment. This medicine may be used for other purposes; ask your health care provider or pharmacist if you have questions. COMMON BRAND NAME(S): Elitek What should I tell my health care provider before I take this medicine? They need to know if you have any of these conditions: -G6PD deficiency -history of anemia -history of blood transfusion -an unusual or allergic reaction to rasburicase, yeast, mannitol, other medicines, foods, dyes, or preservatives -pregnant or trying to get pregnant -breast-feeding How should I use this medicine? This medicine is for infusion into a vein. It is given by a health care professional in a hospital or clinic setting. Talk to your  pediatrician regarding the use of this medicine in children. While this drug may be prescribed for children as young as 41 month old for selected conditions, precautions do apply. Overdosage: If you think you have taken too much of this medicine contact a poison control center or emergency room at once. NOTE: This medicine is only for you. Do not share this medicine with others. What if I miss a dose? This does not apply. What may interact with this medicine? Interactions have not been studied. Give your health care provider a list of all the medicines, herbs, non-prescription drugs, or dietary supplements you use. Also tell them if you smoke, drink alcohol, or use illegal drugs. Some items may interact with your medicine. This list may not describe all possible interactions. Give your health care provider a list of all the medicines, herbs, non-prescription drugs, or dietary supplements you use. Also tell them if you smoke, drink alcohol, or use illegal drugs. Some items may interact with your medicine. What should I watch for while using this medicine? Your condition will be monitored carefully while you are receiving this medicine. You will need to have regular blood tests during your treatment. What side effects may I notice from receiving this medicine? Side effects that you should report to your doctor or health care professional as soon as possible: -allergic reactions like skin rash, itching or hives, swelling of the face, lips, or tongue -blue color to lips or nailbeds -breathing problems -chest pain, tightness -confusion -fast, irregular heartbeat -feeling faint or lightheaded, falls -low blood pressure -seizures -unusually weak or tired -yellowing of the eyes or skin Side effects that  usually do not require medical attention (report to your doctor or health care professional if they continue or are bothersome): -abdominal pain -constipation -diarrhea -fever -headache -nausea,  vomiting -swelling of the ankles, feet, hands This list may not describe all possible side effects. Call your doctor for medical advice about side effects. You may report side effects to FDA at 1-800-FDA-1088. Where should I keep my medicine? This drug is given in a hospital or clinic and will not be stored at home. NOTE: This sheet is a summary. It may not cover all possible information. If you have questions about this medicine, talk to your doctor, pharmacist, or health care provider.  2018 Elsevier/Gold Standard (2013-07-26 13:24:23)

## 2016-10-10 ENCOUNTER — Telehealth: Payer: Self-pay | Admitting: *Deleted

## 2016-10-10 ENCOUNTER — Encounter: Payer: Self-pay | Admitting: Hematology and Oncology

## 2016-10-10 NOTE — Progress Notes (Signed)
[facility]  PATIENT: Ryan Copeland DOB: 05/06/1950   REASON FOR VISIT: Follow-up for worsening peripheral neuropathy HISTORY FROM: Patient    HISTORY OF PRESENT ILLNESS:UPDATE 08/28/2018CM [name], 66 year old male returns for follow-up with long-standing history of polyneuropathy which has worsened recently. He continues to have pain in both feet left worse than right. He has been on Topamax. Medication since last seen in addition he stopped his Metanx. 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 He is currently on prednisone and had his first chemotherapy last week. He remains on Lyrica 500 mg total dose daily. His Lyrica  is very expensive and he was given a co-pay card. He continues to work. He returns for reevaluation.   UPDATE 12/28/2017CM [name], 66 year old male returns for follow-up with history of polyneuropathy which has been long-standing. He continues to have some pain in both feet left worse than the right. Gabapentin was not beneficial for his symptoms. He remains on vitamin B12 injections. He is currently on Lyrica 75 times a day 150 at night. He has failed Cymbalta. He is also on Topamax 75 twice daily. He continues to work full-time. His walking and balance are unchanged. No recent falls.  Epidural injection by Dr. Brien Few last month. 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 He returns for reevaluation  08/21/15 CMMr. Ryan Copeland, 66 year old male returns for followup. He has a history of polyneuropathy which has been long-standing. He was last seen 01/06/15. He continues to have some pain, numbness and paresthesias in both feet left worse than right. Sensation is worse in great toes. His feet are cold he also has a burning sensation. His symptoms have not worsened since last seen. Gabapentin was not beneficial for his symptoms. He was found to have a low B6 level and an extremely low vitamin B12 level at 121. He has been on vitamin B12  injections which his wife gives monthly. He is sleeping well. He is currently taking Lyrica 75 mg 3 times a day and two 75 mg at night for a total dose of 375mg . He was placed on Transdermal cream last year, with good results. He has failed Cymbalta. He is also on Topamax. He states his walking and balance are unchanged. He has not fallen.  Had lower back disc injury. ( has had epidural steroid injections, surgery in future.He has CLL and has been receiving IVIG. He returns for reevaluation  and refills   REVIEW OF SYSTEMS: Full 14 system review of systems performed and notable only for those listed, all others are neg:  Constitutional: neg  Cardiovascular: neg Ear/Nose/Throat: neg  Skin: neg Eyes: neg Respiratory: neg Gastroitestinal: neg  Hematology/Lymphatic: neg  Endocrine: neg Musculoskeletal: Back pain , foot pain paresthesias Allergy/Immunology: neg Neurological: neg Psychiatric: neg Sleep : neg   ALLERGIES: Allergies  Allergen Reactions  . Codeine Hives    Pt states he can take a few, more reaction with extended doses.    HOME MEDICATIONS: Outpatient Medications Prior to Visit  Medication Sig Dispense Refill  . acetaminophen (TYLENOL) 500 MG tablet Take 500 mg by mouth every 6 (six) hours as needed for mild pain, moderate pain or headache.    Marland Kitchen acyclovir (ZOVIRAX) 400 MG tablet Take 1 tablet (400 mg total) by mouth 2 (two) times daily. 60 tablet 3  . allopurinol (ZYLOPRIM) 300 MG tablet Take 1 tablet (300 mg total) by mouth daily. 30 tablet 0  . ALPRAZolam (XANAX) 0.25 MG tablet  Take 0.25 mg by mouth as needed for anxiety.    Marland Kitchen amoxicillin-clavulanate (AUGMENTIN) 875-125 MG tablet Take 1 tablet by mouth 2 (two) times daily. (Patient taking differently: Take 2 tablets by mouth 2 (two) times daily. ) 14 tablet 0  . aspirin 325 MG tablet Take 325 mg by mouth daily.      . benzonatate (TESSALON) 100 MG capsule One to two pills three times per day for cough as needed    .  esomeprazole (NEXIUM) 40 MG capsule Take 40 mg by mouth 2 (two) times daily.  0  . fluconazole (DIFLUCAN) 200 MG tablet take 2 tablets by mouth on day 1 then 1 tablet until finished  0  . HYDROcodone-acetaminophen (NORCO) 10-325 MG tablet take 1 tablet by mouth every 6 to 8 hours if needed for pain  0  . lidocaine (LIDODERM) 5 % apply patch to affected area once daily as directed for lower back pain  0  . lidocaine-prilocaine (EMLA) cream Apply to affected area once 30 g 3  . meclizine (ANTIVERT) 12.5 MG tablet Take 12.5 mg by mouth every 6 (six) hours as needed for dizziness.   0  . metFORMIN (GLUCOPHAGE-XR) 500 MG 24 hr tablet Take by mouth. Takes one tablet in AM and 2 tablets at night.    . metoprolol succinate (TOPROL XL) 25 MG 24 hr tablet Take 1 tablet (25 mg total) by mouth daily. 90 tablet 3  . nitroGLYCERIN (NITROSTAT) 0.4 MG SL tablet place 1 tablet under the tongue if needed every 5 minutes for chest pain 25 tablet 3  . ondansetron (ZOFRAN) 8 MG tablet Take 1 tablet (8 mg total) by mouth 2 (two) times daily as needed for refractory nausea / vomiting. Start on day 2 after bendamustine 30 tablet 1  . predniSONE (DELTASONE) 20 MG tablet Take 3 tablets (60 mg total) by mouth daily with breakfast. 12 tablet 0  . prochlorperazine (COMPAZINE) 10 MG tablet Take 1 tablet (10 mg total) by mouth every 6 (six) hours as needed (Nausea or vomiting). 30 tablet 1  . rosuvastatin (CRESTOR) 5 MG tablet Take 1 tablet (5 mg total) by mouth daily. 90 tablet 3  . tetrahydrozoline 0.05 % ophthalmic solution Place 1 drop into both eyes as needed. Red eyes    . zolpidem (AMBIEN) 10 MG tablet Take 10 mg by mouth at bedtime as needed for sleep.   0  . acyclovir (ZOVIRAX) 400 MG tablet Take 1 tablet (400 mg total) by mouth daily. 90 tablet 3  . cyanocobalamin (,VITAMIN B-12,) 1000 MCG/ML injection Inject 1 mL (1,000 mcg total) into the muscle every 30 (thirty) days. 10 mL 1  . lidocaine-prilocaine (EMLA) cream  apply to affected area topically if needed 30 g 3  . predniSONE (DELTASONE) 20 MG tablet Take 1 tablet (20 mg total) by mouth daily with breakfast. 7 tablet 0  . pregabalin (LYRICA) 100 MG capsule 1 cap three times daily and 2 caps at hs 450 capsule 1  . topiramate (TOPAMAX) 25 MG tablet 3 tabs twice daily if necessary (Patient not taking: Reported on 10/11/2016) 540 tablet 2   Facility-Administered Medications Prior to Visit  Medication Dose Route Frequency Provider Last Rate Last Dose  . 0.9 %  sodium chloride infusion   Intravenous Continuous Alvy Bimler, Ni, MD 50 mL/hr at 03/07/14 1005    . sodium chloride 0.9 % injection 10 mL  10 mL Intracatheter PRN Marcy Panning, MD   10 mL at 08/22/12 1721  PAST MEDICAL HISTORY: Past Medical History:  Diagnosis Date  . Anxiety 01/30/2014  . Back injury    lower disc  . CAD (coronary artery disease)   . CLL (chronic lymphocytic leukemia) ([place]) 03/18/2011  . Diabetes mellitus ([place])    Type 2   . ECRB (extensor carpi radialis brevis) tenosynovitis   . GERD (gastroesophageal reflux disease)    takes Nexium if needed  . Hyperlipidemia   . Lateral epicondylitis of left elbow   . MI, acute, non ST segment elevation ([place]) 06/28/2009   with stenting of the LAD  . Neuromuscular disorder ([place])    peripheral neuropathy  . PVD (peripheral vascular disease) ([place])   . Tobacco abuse     PAST SURGICAL HISTORY: Past Surgical History:  Procedure Laterality Date  . ADENOIDECTOMY  1955  . CARDIAC CATHETERIZATION    . CARDIAC CATHETERIZATION N/A 09/26/2014   Procedure: Left Heart Cath and Coronary Angiography;  Surgeon: Peter M Martinique, MD;  Location: [facility];  Service: Cardiovascular;  Laterality: N/A;  . carpel tunnel release Left 04-1989  . carpel tunnel release  Right 01-1989  . CHOLECYSTECTOMY  2007  . CORONARY STENT PLACEMENT  May 2011  . femoral stents    . LATERAL EPICONDYLE RELEASE Left 02/12/2014   Procedure: LEFT ELBOW DEBRIDEMENT  WITH TENDON REPAIR ;  Surgeon: Lorn Junes, MD;  Location: [facility];  Service: Orthopedics;  Laterality: Left;  . LEFT CAI STENT/PTA AND POPLITEAL ARTERY/TIBIAL THROMBECTOMY     . LEFT HEART CATHETERIZATION WITH CORONARY ANGIOGRAM N/A 08/26/2011   Procedure: LEFT HEART CATHETERIZATION WITH CORONARY ANGIOGRAM;  Surgeon: Peter M Martinique, MD;  Location: [facility];  Service: Cardiovascular;  Laterality: N/A;  . PERIPHERAL VASCULAR CATHETERIZATION N/A 01/01/2015   Procedure: Abdominal Aortogram;  Surgeon: Conrad Waterville, MD;  Location: [facility];  Service: Cardiovascular;  Laterality: N/A;  . TARSAL TUNNEL RELEASE Bilateral 08-2007    FAMILY HISTORY: Family History  Problem Relation Age of Onset  . Lung cancer Mother 73  . Cancer Mother        lung  . Heart failure Father 48  . Heart disease Father     SOCIAL HISTORY: Social History   Social History  . Marital status: Married    Spouse name: [name]  . Number of children: 1  . Years of education: College   Occupational History  .  [place] History Main Topics  . Smoking status: Former Smoker    Packs/day: 0.50    Years: 44.00    Types: Cigarettes    Quit date: 04/23/2016  . Smokeless tobacco: Former Systems developer     Comment: but does have ocassional relapses  . Alcohol use No     Comment:  drinks non-alcoholic beer  . Drug use: No  . Sexual activity: Yes   Other Topics Concern  . Not on file   Social History Narrative   Patient lives at home with wife.   Caffeine Use: 15 cups of caffeine weekly     PHYSICAL EXAM  Vitals:   10/11/16 0833  BP: 125/75  Pulse: 65  Weight: 229 lb (103.9 kg)  Height: 6' (1.829 m)   Body mass index is 31.06 kg/m. Generalized: Well developed, in no acute distress  Head: normocephalic and atraumatic,. Oropharynx benign  Neck: Supple,   Musculoskeletal: No deformity   Neurological examination   Mentation: Alert oriented to time, place,  history taking. Follows all commands speech and language fluent  Cranial nerve II-XII: Pupils were equal round reactive to light extraocular movements were full, visual field were full on confrontational test. Facial sensation and strength were normal. hearing was intact to finger rubbing bilaterally. Uvula tongue midline. head turning and shoulder shrug were normal and symmetric.Tongue protrusion into cheek strength was normal. Motor: normal bulk and tone, full strength in the BUE, BLE, fine finger movements normal, no pronator drift. No focal weakness Sensory: Diminished pinprick to the shins bilaterally, decreased vibratory to the toes, position sense is normal  Coordination: finger-nose-finger, heel-to-shin bilaterally, no dysmetria Reflexes: Diminished upper and lower and symmetric, plantar responses were flexor bilaterally. Gait and Station: Rising up from seated position without assistance, normal stance, moderate stride, good arm swing, smooth turning, able to perform tiptoe, and heel walking without difficulty. Tandem gait is  unsteady. No assistive device  DIAGNOSTIC DATA (LABS, IMAGING, TESTING) - I reviewed patient records, labs, notes, testing and imaging myself where available.  Lab Results  Component Value Date   WBC 22.1 (H) 10/03/2016   HGB 15.6 10/03/2016   HCT 46.2 10/03/2016   MCV 96.3 10/03/2016   PLT 77 (L) 10/03/2016      Component Value Date/Time   NA 139 10/03/2016 0900   K 4.5 10/03/2016 0900   CL 101 05/24/2016 0848   CL 104 08/03/2012 1229   CO2 29 10/03/2016 0900   GLUCOSE 140 10/03/2016 0900   GLUCOSE 223 (H) 08/03/2012 1229   BUN 10.1 10/03/2016 0900   CREATININE 1.1 10/03/2016 0900   CALCIUM 9.9 10/03/2016 0900   PROT 6.6 10/03/2016 0900   ALBUMIN 3.8 10/03/2016 0900   AST 19 10/03/2016 0900   ALT 16 10/03/2016 0900   ALKPHOS 81 10/03/2016 0900   BILITOT 0.47 10/03/2016 0900   GFRNONAA 82 05/24/2016 0848   GFRAA 94 05/24/2016 0848   Lab  Results  Component Value Date   CHOL 146 05/24/2016   HDL 27 (L) 05/24/2016   LDLCALC 79 05/24/2016   LDLDIRECT 117.6 08/22/2011   TRIG 201 (H) 05/24/2016   CHOLHDL 5.4 (H) 05/24/2016   Lab Results  Component Value Date   HGBA1C 7.2 (H) 10/30/2015        ASSESSMENT AND PLAN 66 y.o. year old male has a past medical history of Tobacco abuse; PVD (peripheral vascular disease) ([place]); Diabetes mellitus ([place]); Neuromuscular disorder ([place]); CLL (chronic lymphocytic leukemia) ([place]) (03/18/2011); and peripheral neuropathy here to follow-up. Reviewed labs from 18/20/18.      PLAN: Continue Lyrica to 100mg   3 times daily and 2 at bedtime total dose 500 RX to pt Add on Gabapentin 300mg  BID patient wants to try again Continue vitamin B 12 injections Follow-up in 6 months, next with Dr. Luan Pulling, [facility], [facility], [facility] [address] [place],  [address] [phone]

## 2016-10-10 NOTE — Telephone Encounter (Signed)
-----   Message from Flo Shanks, RN sent at 10/06/2016  2:33 PM EDT ----- Regarding: Dr. Alvy Bimler First time [place] First time [place] and repeat Rituxan from 2014.

## 2016-10-10 NOTE — Telephone Encounter (Signed)
Called patient for chemo follow up. States he did very well. Denies N/V. Eating and drinking without problems.

## 2016-10-11 ENCOUNTER — Encounter: Payer: Self-pay | Admitting: Nurse Practitioner

## 2016-10-11 ENCOUNTER — Ambulatory Visit (INDEPENDENT_AMBULATORY_CARE_PROVIDER_SITE_OTHER): Payer: 59 | Admitting: Nurse Practitioner

## 2016-10-11 VITALS — BP 125/75 | HR 65 | Ht 72.0 in | Wt 229.0 lb

## 2016-10-11 DIAGNOSIS — G619 Inflammatory polyneuropathy, unspecified: Secondary | ICD-10-CM

## 2016-10-11 DIAGNOSIS — G609 Hereditary and idiopathic neuropathy, unspecified: Secondary | ICD-10-CM | POA: Diagnosis not present

## 2016-10-11 DIAGNOSIS — G622 Polyneuropathy due to other toxic agents: Secondary | ICD-10-CM | POA: Diagnosis not present

## 2016-10-11 MED ORDER — CYANOCOBALAMIN 1000 MCG/ML IJ SOLN: 1000.0000 ug | INTRAMUSCULAR | 1 refills | Status: DC

## 2016-10-11 MED ORDER — GABAPENTIN 300 MG PO CAPS: 300.0000 mg | ORAL_CAPSULE | Freq: Two times a day (BID) | ORAL | 1 refills | Status: DC

## 2016-10-11 MED ORDER — PREGABALIN 100 MG PO CAPS
ORAL_CAPSULE | ORAL | 1 refills | Status: DC
Start: 2016-10-11 — End: 2017-04-13

## 2016-10-11 NOTE — Patient Instructions (Signed)
Continue Lyrica to 100mg   3 times daily and 2 at bedtime total dose 500 RX to pt Add on Gabapentin 300mg  BID Continue vitamin B 12 injections Follow-up in 6 months, next with Dr. Krista Blue

## 2016-10-11 NOTE — Progress Notes (Signed)
I have reviewed and agreed above plan. 

## 2016-10-14 ENCOUNTER — Telehealth: Payer: Self-pay | Admitting: *Deleted

## 2016-10-14 NOTE — Telephone Encounter (Signed)
"  [place] Therapist, sports, Tourist information centre manager from [employer].  I manage this patient.  IVIG not authorized for June 10, 2016, Jul 08, 2016, August 05, 2016 or September 02, 2016.  Need to speak with who manages your prior authorizations to obtain a letter of medical necessity."   Call transferred.

## 2016-10-18 ENCOUNTER — Other Ambulatory Visit: Payer: Self-pay | Admitting: Hematology and Oncology

## 2016-10-18 DIAGNOSIS — C9110 Chronic lymphocytic leukemia of B-cell type not having achieved remission: Secondary | ICD-10-CM

## 2016-10-20 ENCOUNTER — Other Ambulatory Visit: Payer: Self-pay | Admitting: Hematology and Oncology

## 2016-10-20 DIAGNOSIS — C9110 Chronic lymphocytic leukemia of B-cell type not having achieved remission: Secondary | ICD-10-CM

## 2016-11-03 ENCOUNTER — Other Ambulatory Visit (HOSPITAL_BASED_OUTPATIENT_CLINIC_OR_DEPARTMENT_OTHER): Payer: 59

## 2016-11-03 ENCOUNTER — Encounter: Payer: Self-pay | Admitting: Hematology and Oncology

## 2016-11-03 ENCOUNTER — Ambulatory Visit: Payer: 59

## 2016-11-03 ENCOUNTER — Ambulatory Visit (HOSPITAL_BASED_OUTPATIENT_CLINIC_OR_DEPARTMENT_OTHER): Payer: 59 | Admitting: Hematology and Oncology

## 2016-11-03 ENCOUNTER — Ambulatory Visit (HOSPITAL_BASED_OUTPATIENT_CLINIC_OR_DEPARTMENT_OTHER): Payer: 59

## 2016-11-03 ENCOUNTER — Telehealth: Payer: Self-pay | Admitting: Hematology and Oncology

## 2016-11-03 VITALS — BP 129/70 | HR 71 | Temp 97.9°F | Resp 18

## 2016-11-03 DIAGNOSIS — Z23 Encounter for immunization: Secondary | ICD-10-CM

## 2016-11-03 DIAGNOSIS — C9110 Chronic lymphocytic leukemia of B-cell type not having achieved remission: Secondary | ICD-10-CM

## 2016-11-03 DIAGNOSIS — D61818 Other pancytopenia: Secondary | ICD-10-CM | POA: Diagnosis not present

## 2016-11-03 DIAGNOSIS — Z5112 Encounter for antineoplastic immunotherapy: Secondary | ICD-10-CM | POA: Diagnosis not present

## 2016-11-03 DIAGNOSIS — D801 Nonfamilial hypogammaglobulinemia: Secondary | ICD-10-CM

## 2016-11-03 DIAGNOSIS — C9190 Lymphoid leukemia, unspecified not having achieved remission: Secondary | ICD-10-CM

## 2016-11-03 DIAGNOSIS — Z5111 Encounter for antineoplastic chemotherapy: Secondary | ICD-10-CM

## 2016-11-03 LAB — CBC WITH DIFFERENTIAL/PLATELET
BASO%: 0.1 % (ref 0.0–2.0)
Basophils Absolute: 0 10*3/uL (ref 0.0–0.1)
EOS%: 3 % (ref 0.0–7.0)
Eosinophils Absolute: 0.1 10*3/uL (ref 0.0–0.5)
HCT: 38.8 % (ref 38.4–49.9)
HEMOGLOBIN: 13.4 g/dL (ref 13.0–17.1)
LYMPH%: 25.2 % (ref 14.0–49.0)
MCH: 33 pg (ref 27.2–33.4)
MCHC: 34.6 g/dL (ref 32.0–36.0)
MCV: 95.3 fL (ref 79.3–98.0)
MONO#: 0.3 10*3/uL (ref 0.1–0.9)
MONO%: 9.8 % (ref 0.0–14.0)
NEUT%: 61.9 % (ref 39.0–75.0)
NEUTROS ABS: 2.1 10*3/uL (ref 1.5–6.5)
Platelets: 51 10*3/uL — ABNORMAL LOW (ref 140–400)
RBC: 4.07 10*6/uL — ABNORMAL LOW (ref 4.20–5.82)
RDW: 15.6 % — AB (ref 11.0–14.6)
WBC: 3.4 10*3/uL — AB (ref 4.0–10.3)
lymph#: 0.9 10*3/uL (ref 0.9–3.3)

## 2016-11-03 LAB — COMPREHENSIVE METABOLIC PANEL
ALBUMIN: 3.3 g/dL — AB (ref 3.5–5.0)
ALK PHOS: 85 U/L (ref 40–150)
ALT: 8 U/L (ref 0–55)
AST: 12 U/L (ref 5–34)
Anion Gap: 8 mEq/L (ref 3–11)
BUN: 12.2 mg/dL (ref 7.0–26.0)
CO2: 23 mEq/L (ref 22–29)
Calcium: 8.6 mg/dL (ref 8.4–10.4)
Chloride: 106 mEq/L (ref 98–109)
Creatinine: 1.1 mg/dL (ref 0.7–1.3)
EGFR: 73 mL/min/{1.73_m2} — ABNORMAL LOW (ref >90)
GLUCOSE: 148 mg/dL — AB (ref 70–140)
POTASSIUM: 4.1 meq/L (ref 3.5–5.1)
SODIUM: 137 meq/L (ref 136–145)
TOTAL PROTEIN: 5.9 g/dL — AB (ref 6.4–8.3)
Total Bilirubin: 0.64 mg/dL (ref 0.20–1.20)

## 2016-11-03 LAB — URIC ACID: Uric Acid, Serum: 4.1 mg/dl (ref 2.6–7.4)

## 2016-11-03 MED ORDER — SODIUM CHLORIDE 0.9 % IV SOLN
72.0000 mg/m2 | Freq: Once | INTRAVENOUS | Status: AC
  Administered 2016-11-03: 175 mg via INTRAVENOUS
  Filled 2016-11-03: qty 7

## 2016-11-03 MED ORDER — INFLUENZA VAC SPLIT QUAD 0.5 ML IM SUSY
0.5000 mL | PREFILLED_SYRINGE | Freq: Once | INTRAMUSCULAR | Status: AC
Start: 2016-11-03 — End: 2016-11-03
  Administered 2016-11-03: 0.5 mL via INTRAMUSCULAR
  Filled 2016-11-03: qty 0.5

## 2016-11-03 MED ORDER — DIPHENHYDRAMINE HCL 25 MG PO CAPS
ORAL_CAPSULE | ORAL | Status: AC
  Filled 2016-11-03: qty 2

## 2016-11-03 MED ORDER — SODIUM CHLORIDE 0.9% FLUSH
10.0000 mL | INTRAVENOUS | Status: DC | PRN
  Administered 2016-11-03: 10 mL
  Filled 2016-11-03: qty 10

## 2016-11-03 MED ORDER — ACETAMINOPHEN 325 MG PO TABS
ORAL_TABLET | ORAL | Status: AC
  Filled 2016-11-03: qty 2

## 2016-11-03 MED ORDER — SODIUM CHLORIDE 0.9 % IV SOLN
375.0000 mg/m2 | Freq: Once | INTRAVENOUS | Status: AC
  Administered 2016-11-03: 900 mg via INTRAVENOUS
  Filled 2016-11-03: qty 50

## 2016-11-03 MED ORDER — PALONOSETRON HCL INJECTION 0.25 MG/5ML
0.2500 mg | Freq: Once | INTRAVENOUS | Status: AC
  Administered 2016-11-03: 0.25 mg via INTRAVENOUS

## 2016-11-03 MED ORDER — SODIUM CHLORIDE 0.9% FLUSH
10.0000 mL | Freq: Once | INTRAVENOUS | Status: AC
  Administered 2016-11-03: 10 mL
  Filled 2016-11-03: qty 10

## 2016-11-03 MED ORDER — HEPARIN SOD (PORK) LOCK FLUSH 100 UNIT/ML IV SOLN
500.0000 [IU] | Freq: Once | INTRAVENOUS | Status: AC | PRN
  Administered 2016-11-03: 500 [IU]
  Filled 2016-11-03: qty 5

## 2016-11-03 MED ORDER — DEXAMETHASONE SODIUM PHOSPHATE 10 MG/ML IJ SOLN
10.0000 mg | Freq: Once | INTRAMUSCULAR | Status: AC
  Administered 2016-11-03: 10 mg via INTRAVENOUS

## 2016-11-03 MED ORDER — DIPHENHYDRAMINE HCL 25 MG PO CAPS
50.0000 mg | ORAL_CAPSULE | Freq: Once | ORAL | Status: AC
  Administered 2016-11-03: 50 mg via ORAL

## 2016-11-03 MED ORDER — PALONOSETRON HCL INJECTION 0.25 MG/5ML
INTRAVENOUS | Status: AC
  Filled 2016-11-03: qty 5

## 2016-11-03 MED ORDER — DEXAMETHASONE SODIUM PHOSPHATE 10 MG/ML IJ SOLN
INTRAMUSCULAR | Status: AC
  Filled 2016-11-03: qty 1

## 2016-11-03 MED ORDER — FAMOTIDINE IN NACL 20-0.9 MG/50ML-% IV SOLN
INTRAVENOUS | Status: AC
  Filled 2016-11-03: qty 50

## 2016-11-03 MED ORDER — ACETAMINOPHEN 325 MG PO TABS
650.0000 mg | ORAL_TABLET | Freq: Once | ORAL | Status: AC
  Administered 2016-11-03: 650 mg via ORAL

## 2016-11-03 MED ORDER — SODIUM CHLORIDE 0.9 % IV SOLN
Freq: Once | INTRAVENOUS | Status: AC
  Administered 2016-11-03: 10:00:00 via INTRAVENOUS

## 2016-11-03 MED ORDER — FAMOTIDINE IN NACL 20-0.9 MG/50ML-% IV SOLN
20.0000 mg | Freq: Once | INTRAVENOUS | Status: AC
  Administered 2016-11-03: 20 mg via INTRAVENOUS

## 2016-11-03 NOTE — Progress Notes (Signed)
[facility] OFFICE PROGRESS NOTE  Patient Care Team: [name]., PA-C as PCP - General (Family Medicine) Burtis Junes, NP as Nurse Practitioner (Cardiology) Carol Ada, MD as Consulting Physician (Gastroenterology)  SUMMARY OF ONCOLOGIC HISTORY:   CLL (chronic lymphocytic leukemia) ([place])   06/28/2009 Imaging    1.  No evidence of aortic dissection or other acute process in the chest. 2.  Centrilobular emphysema with a 5 mm right lung nodule. Given the concurrent centrilobular emphysema, follow-up chest CT at 6 -12 months is recommended.  3.  Coronary artery atherosclerosis which is age advanced. 4.  Prominent thoracic lymph nodes.  These can be reevaluated at follow-up.      05/06/2010 Imaging    1.  Multiple small periaortic lymph nodes consistent with the patient's history of the chronic lymphocytic leukemia. 2.  No evidence of solid organ involvement      11/03/2011 Imaging    1.  Interval progression of abdominal and pelvic adenopathy. 2.  Progression of splenomegaly.  The spleen now measures 23 cm in length      11/18/2011 Bone Marrow Biopsy    Bone Marrow, Aspirate,Biopsy, and Clot, right iliac bone - HYPERCELLULAR BONE MARROW WITH EXTENSIVE INVOLVEMENT BY CHRONIC LYMPHOCYTIC LEUKEMIA. PERIPHERAL BLOOD: - CHRONIC LYMPHOCYTIC LEUKEMIA      03/08/2012 Imaging    1.  Progressive increase in retroperitoneal, iliac, and inguinal lymphadenopathy. 2.  Interval increase in massive splenomegaly.       06/01/2012 Procedure    Placement of single lumen port a cath via right internal jugular vein.  The catheter tip lies at the cavoatrial junction.  A power injectable port a cath was placed and is ready for immediate use      06/20/2012 - 11/23/2012 Chemotherapy    He received FCR x 6 cycles      12/19/2012 Imaging    Left common iliac stent. Abdominal vasculature remains patent. Improving supraclavicular and axillary lymphadenopathy. Residual right  subpectoral nodes measure up to 10 mm short axis. Improving retroperitoneal lymphadenopathy, measuring up to 16 mm short axis. Improving splenomegaly, measuring 18.7 cm.       01/20/2016 Imaging    1. Stable exam.  No new or progressive findings. 2. No CT findings to explain odynophagia      09/02/2016 Pathology Results    The findings are consistent with involvement by previously known chronic lymphocytic leukemia      09/02/2016 Pathology Results    FISH for CLL came back positive for deletion 13q      09/15/2016 Imaging    1. Borderline enlarged abdominal and pelvic lymph nodes. Compared with 11/03/2011 these are decreased in size as detailed above. 2. Persistent splenomegaly. 3. Aortic Atherosclerosis (ICD10-I70.0). LAD coronary artery calcification noted.      10/06/2016 -  Chemotherapy    He received Bendamustine and Rituxan      11/03/2016 Adverse Reaction    Dose of Bendamustine is reduced due to severe pancytopenia       INTERVAL HISTORY: Please see below for problem oriented charting. He returns for further follow-up He feels well No recent infection No new lymphadenopathy He had mild nausea, resolved with antiemetics He noted increased bruising  REVIEW OF SYSTEMS:   Constitutional: Denies fevers, chills or abnormal weight loss Eyes: Denies blurriness of vision Ears, nose, mouth, throat, and face: Denies mucositis or sore throat Respiratory: Denies cough, dyspnea or wheezes Cardiovascular: Denies palpitation, chest discomfort or lower extremity swelling Skin: Denies abnormal  skin rashes Lymphatics: Denies new lymphadenopathy  Neurological:Denies numbness, tingling or new weaknesses Behavioral/Psych: Mood is stable, no new changes  All other systems were reviewed with the patient and are negative.  I have reviewed the past medical history, past surgical history, social history and family history with the patient and they are unchanged from previous  note.  ALLERGIES:  is allergic to codeine.  MEDICATIONS:  Current Outpatient Prescriptions  Medication Sig Dispense Refill  . acetaminophen (TYLENOL) 500 MG tablet Take 500 mg by mouth every 6 (six) hours as needed for mild pain, moderate pain or headache.    Marland Kitchen acyclovir (ZOVIRAX) 400 MG tablet Take 1 tablet (400 mg total) by mouth 2 (two) times daily. 60 tablet 3  . allopurinol (ZYLOPRIM) 300 MG tablet Take 1 tablet (300 mg total) by mouth daily. 30 tablet 0  . ALPRAZolam (XANAX) 0.25 MG tablet Take 0.25 mg by mouth as needed for anxiety.    Marland Kitchen aspirin 325 MG tablet Take 325 mg by mouth daily.      . benzonatate (TESSALON) 100 MG capsule One to two pills three times per day for cough as needed    . cyanocobalamin (,VITAMIN B-12,) 1000 MCG/ML injection Inject 1 mL (1,000 mcg total) into the muscle every 30 (thirty) days. 10 mL 1  . esomeprazole (NEXIUM) 40 MG capsule Take 40 mg by mouth 2 (two) times daily.  0  . gabapentin (NEURONTIN) 300 MG capsule Take 1 capsule (300 mg total) by mouth 2 (two) times daily. 180 capsule 1  . HYDROcodone-acetaminophen (NORCO) 10-325 MG tablet take 1 tablet by mouth every 6 to 8 hours if needed for pain  0  . lidocaine (LIDODERM) 5 % apply patch to affected area once daily as directed for lower back pain  0  . lidocaine-prilocaine (EMLA) cream Apply to affected area once 30 g 3  . meclizine (ANTIVERT) 12.5 MG tablet Take 12.5 mg by mouth every 6 (six) hours as needed for dizziness.   0  . metFORMIN (GLUCOPHAGE-XR) 500 MG 24 hr tablet Take by mouth. Takes one tablet in AM and 2 tablets at night.    . metoprolol succinate (TOPROL XL) 25 MG 24 hr tablet Take 1 tablet (25 mg total) by mouth daily. 90 tablet 3  . nitroGLYCERIN (NITROSTAT) 0.4 MG SL tablet place 1 tablet under the tongue if needed every 5 minutes for chest pain 25 tablet 3  . ondansetron (ZOFRAN) 8 MG tablet Take 1 tablet (8 mg total) by mouth 2 (two) times daily as needed for refractory nausea /  vomiting. Start on day 2 after bendamustine 30 tablet 1  . pregabalin (LYRICA) 100 MG capsule 1 cap three times daily and 2 caps at hs 450 capsule 1  . prochlorperazine (COMPAZINE) 10 MG tablet Take 1 tablet (10 mg total) by mouth every 6 (six) hours as needed (Nausea or vomiting). 30 tablet 1  . rosuvastatin (CRESTOR) 5 MG tablet Take 1 tablet (5 mg total) by mouth daily. 90 tablet 3  . tetrahydrozoline 0.05 % ophthalmic solution Place 1 drop into both eyes as needed. Red eyes    . zolpidem (AMBIEN) 10 MG tablet Take 10 mg by mouth at bedtime as needed for sleep.   0   No current facility-administered medications for this visit.    Facility-Administered Medications Ordered in Other Visits  Medication Dose Route Frequency Provider Last Rate Last Dose  . 0.9 %  sodium chloride infusion   Intravenous Continuous Jailyne Chieffo,  MD 50 mL/hr at 03/07/14 1005    . bendamustine (BENDEKA) 175 mg in sodium chloride 0.9 % 50 mL (3.0702 mg/mL) chemo infusion  72 mg/m2 (Treatment Plan Recorded) Intravenous Once Rayvon Dakin, MD      . heparin lock flush 100 unit/mL  500 Units Intracatheter Once PRN Alvy Bimler, Jashawna Reever, MD      . sodium chloride 0.9 % injection 10 mL  10 mL Intracatheter PRN Marcy Panning, MD   10 mL at 08/22/12 1721  . sodium chloride flush (NS) 0.9 % injection 10 mL  10 mL Intracatheter PRN Alvy Bimler, Seung Nidiffer, MD        PHYSICAL EXAMINATION: ECOG PERFORMANCE STATUS: 1 - Symptomatic but completely ambulatory  Vitals:   11/03/16 0924  BP: 138/65  Pulse: 70  Resp: 18  Temp: 98 F (36.7 C)  SpO2: 100%   Filed Weights   11/03/16 0924  Weight: 234 lb 12.8 oz (106.5 kg)    GENERAL:alert, no distress and comfortable SKIN: skin color, texture, turgor are normal, no rashes or significant lesions.  Noted bruising, no petechiae EYES: normal, Conjunctiva are pink and non-injected, sclera clear OROPHARYNX:no exudate, no erythema and lips, buccal mucosa, and tongue normal  NECK: supple, thyroid normal  size, non-tender, without nodularity LYMPH:  no palpable lymphadenopathy in the cervical, axillary or inguinal LUNGS: clear to auscultation and percussion with normal breathing effort HEART: regular rate & rhythm and no murmurs and no lower extremity edema ABDOMEN:abdomen soft, non-tender and normal bowel sounds Musculoskeletal:no cyanosis of digits and no clubbing  NEURO: alert & oriented x 3 with fluent speech, no focal motor/sensory deficits  LABORATORY DATA:  I have reviewed the data as listed    Component Value Date/Time   NA 137 11/03/2016 0858   K 4.1 11/03/2016 0858   CL 101 05/24/2016 0848   CL 104 08/03/2012 1229   CO2 23 11/03/2016 0858   GLUCOSE 148 (H) 11/03/2016 0858   GLUCOSE 223 (H) 08/03/2012 1229   BUN 12.2 11/03/2016 0858   CREATININE 1.1 11/03/2016 0858   CALCIUM 8.6 11/03/2016 0858   PROT 5.9 (L) 11/03/2016 0858   ALBUMIN 3.3 (L) 11/03/2016 0858   AST 12 11/03/2016 0858   ALT 8 11/03/2016 0858   ALKPHOS 85 11/03/2016 0858   BILITOT 0.64 11/03/2016 0858   GFRNONAA 82 05/24/2016 0848   GFRAA 94 05/24/2016 0848    No results found for: SPEP, UPEP  Lab Results  Component Value Date   WBC 3.4 (L) 11/03/2016   NEUTROABS 2.1 11/03/2016   HGB 13.4 11/03/2016   HCT 38.8 11/03/2016   MCV 95.3 11/03/2016   PLT 51 (L) 11/03/2016      Chemistry      Component Value Date/Time   NA 137 11/03/2016 0858   K 4.1 11/03/2016 0858   CL 101 05/24/2016 0848   CL 104 08/03/2012 1229   CO2 23 11/03/2016 0858   BUN 12.2 11/03/2016 0858   CREATININE 1.1 11/03/2016 0858      Component Value Date/Time   CALCIUM 8.6 11/03/2016 0858   ALKPHOS 85 11/03/2016 0858   AST 12 11/03/2016 0858   ALT 8 11/03/2016 0858   BILITOT 0.64 11/03/2016 0858      ASSESSMENT & PLAN:  CLL (chronic lymphocytic leukemia) He tolerated cycle 1 well except for pancytopenia and mild nausea I will reduce a dose of bendamustine moving forward We will continue treatment for minimum 6  cycles He is reminded to take acyclovir for antimicrobial prophylaxis  Pancytopenia, acquired [facility]) He has acquired pancytopenia due to side effects of treatment I would reduce a dose of chemotherapy by 20% He is instructed to reduce a dose of aspirin therapy to 81 mg due to severe thrombocytopenia and increased bruising  Hypogammaglobulinemia, 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  All questions were answered. The patient knows to call the clinic with any problems, questions or concerns. No barriers to learning was detected. I spent 25 minutes counseling the patient face to face. The total time spent in the appointment was 30 minutes and more than 50% was on counseling and review of test results     [name], MD 11/03/2016 11:09 AM

## 2016-11-03 NOTE — Assessment & Plan Note (Signed)
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

## 2016-11-03 NOTE — Patient Instructions (Signed)
Implanted Port Home Guide An implanted port is a type of central line that is placed under the skin. Central lines are used to provide IV access when treatment or nutrition needs to be given through a person's veins. Implanted ports are used for long-term IV access. An implanted port may be placed because:  You need IV medicine that would be irritating to the small veins in your hands or arms.  You need long-term IV medicines, such as antibiotics.  You need IV nutrition for a long period.  You need frequent blood draws for lab tests.  You need dialysis.  Implanted ports are usually placed in the chest area, but they can also be placed in the upper arm, the abdomen, or the leg. An implanted port has two main parts:  Reservoir. The reservoir is round and will appear as a small, raised area under your skin. The reservoir is the part where a needle is inserted to give medicines or draw blood.  Catheter. The catheter is a thin, flexible tube that extends from the reservoir. The catheter is placed into a large vein. Medicine that is inserted into the reservoir goes into the catheter and then into the vein.  How will I care for my incision site? Do not get the incision site wet. Bathe or shower as directed by your health care provider. How is my port accessed? Special steps must be taken to access the port:  Before the port is accessed, a numbing cream can be placed on the skin. This helps numb the skin over the port site.  Your health care provider uses a sterile technique to access the port. ? Your health care provider must put on a mask and sterile gloves. ? The skin over your port is cleaned carefully with an antiseptic and allowed to dry. ? The port is gently pinched between sterile gloves, and a needle is inserted into the port.  Only "non-coring" port needles should be used to access the port. Once the port is accessed, a blood return should be checked. This helps ensure that the port  is in the vein and is not clogged.  If your port needs to remain accessed for a constant infusion, a clear (transparent) bandage will be placed over the needle site. The bandage and needle will need to be changed every week, or as directed by your health care provider.  Keep the bandage covering the needle clean and dry. Do not get it wet. Follow your health care provider's instructions on how to take a shower or bath while the port is accessed.  If your port does not need to stay accessed, no bandage is needed over the port.  What is flushing? Flushing helps keep the port from getting clogged. Follow your health care provider's instructions on how and when to flush the port. Ports are usually flushed with saline solution or a medicine called heparin. The need for flushing will depend on how the port is used.  If the port is used for intermittent medicines or blood draws, the port will need to be flushed: ? After medicines have been given. ? After blood has been drawn. ? As part of routine maintenance.  If a constant infusion is running, the port may not need to be flushed.  How long will my port stay implanted? The port can stay in for as long as your health care provider thinks it is needed. When it is time for the port to come out, surgery will be   done to remove it. The procedure is similar to the one performed when the port was put in. When should I seek immediate medical care? When you have an implanted port, you should seek immediate medical care if:  You notice a bad smell coming from the incision site.  You have swelling, redness, or drainage at the incision site.  You have more swelling or pain at the port site or the surrounding area.  You have a fever that is not controlled with medicine.  This information is not intended to replace advice given to you by your health care provider. Make sure you discuss any questions you have with your health care provider. Document  Released: 01/31/2005 Document Revised: 07/09/2015 Document Reviewed: 10/08/2012 Elsevier Interactive Patient Education  2017 [employer]  

## 2016-11-03 NOTE — Telephone Encounter (Signed)
Scheduled appt per 9/20 los - patient to get new printed schedule in the treatment area.,

## 2016-11-03 NOTE — Patient Instructions (Signed)
[facility] Discharge Instructions for Patients Receiving Chemotherapy  Today you received the following chemotherapy agents Rituxan and Bendamustine  To help prevent nausea and vomiting after your treatment, we encourage you to take your nausea medication as directed   If you develop nausea and vomiting that is not controlled by your nausea medication, call the clinic.   BELOW ARE SYMPTOMS THAT SHOULD BE REPORTED IMMEDIATELY:  *FEVER GREATER THAN 100.5 F  *CHILLS WITH OR WITHOUT FEVER  NAUSEA AND VOMITING THAT IS NOT CONTROLLED WITH YOUR NAUSEA MEDICATION  *UNUSUAL SHORTNESS OF BREATH  *UNUSUAL BRUISING OR BLEEDING  TENDERNESS IN MOUTH AND THROAT WITH OR WITHOUT PRESENCE OF ULCERS  *URINARY PROBLEMS  *BOWEL PROBLEMS  UNUSUAL RASH Items with * indicate a potential emergency and should be followed up as soon as possible.  Feel free to call the clinic you have any questions or concerns. The clinic phone number is [phone].  Please show the [facility] at check-in to the Emergency Department and triage nurse.

## 2016-11-03 NOTE — Assessment & Plan Note (Signed)
He tolerated cycle 1 well except for pancytopenia and mild nausea I will reduce a dose of bendamustine moving forward We will continue treatment for minimum 6 cycles He is reminded to take acyclovir for antimicrobial prophylaxis

## 2016-11-03 NOTE — Assessment & Plan Note (Signed)
He has acquired pancytopenia due to side effects of treatment I would reduce a dose of chemotherapy by 20% He is instructed to reduce a dose of aspirin therapy to 81 mg due to severe thrombocytopenia and increased bruising

## 2016-11-04 ENCOUNTER — Ambulatory Visit (HOSPITAL_BASED_OUTPATIENT_CLINIC_OR_DEPARTMENT_OTHER): Payer: 59

## 2016-11-04 VITALS — BP 143/62 | HR 67 | Temp 97.7°F | Resp 18

## 2016-11-04 DIAGNOSIS — Z5111 Encounter for antineoplastic chemotherapy: Secondary | ICD-10-CM

## 2016-11-04 DIAGNOSIS — C9190 Lymphoid leukemia, unspecified not having achieved remission: Secondary | ICD-10-CM | POA: Diagnosis not present

## 2016-11-04 DIAGNOSIS — C9110 Chronic lymphocytic leukemia of B-cell type not having achieved remission: Secondary | ICD-10-CM

## 2016-11-04 MED ORDER — SODIUM CHLORIDE 0.9% FLUSH
10.0000 mL | INTRAVENOUS | Status: DC | PRN
  Administered 2016-11-04: 10 mL
  Filled 2016-11-04: qty 10

## 2016-11-04 MED ORDER — SODIUM CHLORIDE 0.9 % IV SOLN
Freq: Once | INTRAVENOUS | Status: AC
  Administered 2016-11-04: 08:00:00 via INTRAVENOUS

## 2016-11-04 MED ORDER — DEXAMETHASONE SODIUM PHOSPHATE 10 MG/ML IJ SOLN
10.0000 mg | Freq: Once | INTRAMUSCULAR | Status: AC
  Administered 2016-11-04: 10 mg via INTRAVENOUS

## 2016-11-04 MED ORDER — HEPARIN SOD (PORK) LOCK FLUSH 100 UNIT/ML IV SOLN
500.0000 [IU] | Freq: Once | INTRAVENOUS | Status: AC | PRN
  Administered 2016-11-04: 500 [IU]
  Filled 2016-11-04: qty 5

## 2016-11-04 MED ORDER — DEXAMETHASONE SODIUM PHOSPHATE 10 MG/ML IJ SOLN
INTRAMUSCULAR | Status: AC
  Filled 2016-11-04: qty 1

## 2016-11-04 MED ORDER — SODIUM CHLORIDE 0.9 % IV SOLN
72.0000 mg/m2 | Freq: Once | INTRAVENOUS | Status: AC
  Administered 2016-11-04: 175 mg via INTRAVENOUS
  Filled 2016-11-04: qty 7

## 2016-11-04 NOTE — Patient Instructions (Signed)
[facility] Discharge Instructions for Patients Receiving Chemotherapy  Today you received the following chemotherapy agents Bendamustine  To help prevent nausea and vomiting after your treatment, we encourage you to take your nausea medication as directed   If you develop nausea and vomiting that is not controlled by your nausea medication, call the clinic.   BELOW ARE SYMPTOMS THAT SHOULD BE REPORTED IMMEDIATELY:  *FEVER GREATER THAN 100.5 F  *CHILLS WITH OR WITHOUT FEVER  NAUSEA AND VOMITING THAT IS NOT CONTROLLED WITH YOUR NAUSEA MEDICATION  *UNUSUAL SHORTNESS OF BREATH  *UNUSUAL BRUISING OR BLEEDING  TENDERNESS IN MOUTH AND THROAT WITH OR WITHOUT PRESENCE OF ULCERS  *URINARY PROBLEMS  *BOWEL PROBLEMS  UNUSUAL RASH Items with * indicate a potential emergency and should be followed up as soon as possible.  Feel free to call the clinic you have any questions or concerns. The clinic phone number is [phone].  Please show the [facility] at check-in to the Emergency Department and triage nurse.

## 2016-11-28 NOTE — Progress Notes (Signed)
CARDIOLOGY OFFICE NOTE  Date:  11/29/2016    Ryan Copeland Date of Birth: 1950/07/24 Medical Record #[id number]  PCP:  [name]., PA-C  Cardiologist:  Servando Snare Nahser   Chief Complaint  Patient presents with  . Coronary Artery Disease    6 month check - seen for Dr. Acie Fredrickson    History of Present Illness: Ryan Copeland is a 66 y.o. male who presents today for a 6 month check. Seen for Dr. Acie Fredrickson. Former patient of Dr. Susa Simmonds.   Has known prior NSTEMI and stenting of the LAD in 2011. Underwent repeat cath due to refractory symptoms back in August of 2016 - managed medically. Other issues include tobacco abuse, PVD with past iliac stenting, HTN, HLD, DM, chronic thrombocytopenia and CLL .  Seen back in Septemberof 2017 - had gained weight. Some intermittent chest heaviness - no response with NTG but I added Imdur to his regimen. When seen back in October - still with atypical chest pain - felt more like a pulled muscle but no relief with NSAID. He thought the Imdur had helped however. I talked with Dr. Acie Fredrickson - wanted to avoid Myoview - most likely it was felt to turn out abnormal. Elected to just follow for now. Ended up having GI work up last December - this was felt to be possibly causing some of his prior chest pain - turned out to be a yeast infection and with treatment his chest pain resolved. We got him off Imdur so he could resume Viagra. Still struggling with trying to stop smoking.   Comes back today. Here alone. Has gone back into treatment for his CLL since last visit with me. He is tired. His treatment has been successful per his report but still has 2 more cycles to go. No chest pain. He has stopped smoking - has been off for about 6 months. Still working. Other than the fatigue he is doing ok. Testosterone levels low normal. Lipids done by PCP earlier this month.   Past Medical History:  Diagnosis Date  . Anxiety 01/30/2014  . Back injury    lower  disc  . CAD (coronary artery disease)   . CLL (chronic lymphocytic leukemia) ([place]) 03/18/2011  . Diabetes mellitus ([place])    Type 2   . ECRB (extensor carpi radialis brevis) tenosynovitis   . GERD (gastroesophageal reflux disease)    takes Nexium if needed  . Hyperlipidemia   . Lateral epicondylitis of left elbow   . MI, acute, non ST segment elevation ([place]) 06/28/2009   with stenting of the LAD  . Neuromuscular disorder ([place])    peripheral neuropathy  . PVD (peripheral vascular disease) ([place])   . Tobacco abuse     Past Surgical History:  Procedure Laterality Date  . ADENOIDECTOMY  1955  . CARDIAC CATHETERIZATION    . CARDIAC CATHETERIZATION N/A 09/26/2014   Procedure: Left Heart Cath and Coronary Angiography;  Surgeon: Peter M Martinique, MD;  Location: [facility];  Service: Cardiovascular;  Laterality: N/A;  . carpel tunnel release Left 04-1989  . carpel tunnel release  Right 01-1989  . CHOLECYSTECTOMY  2007  . CORONARY STENT PLACEMENT  May 2011  . femoral stents    . LATERAL EPICONDYLE RELEASE Left 02/12/2014   Procedure: LEFT ELBOW DEBRIDEMENT WITH TENDON REPAIR ;  Surgeon: Lorn Junes, MD;  Location: [facility];  Service: Orthopedics;  Laterality: Left;  . LEFT CAI STENT/PTA AND POPLITEAL ARTERY/TIBIAL  THROMBECTOMY     . LEFT HEART CATHETERIZATION WITH CORONARY ANGIOGRAM N/A 08/26/2011   Procedure: LEFT HEART CATHETERIZATION WITH CORONARY ANGIOGRAM;  Surgeon: Peter M Martinique, MD;  Location: [facility];  Service: Cardiovascular;  Laterality: N/A;  . PERIPHERAL VASCULAR CATHETERIZATION N/A 01/01/2015   Procedure: Abdominal Aortogram;  Surgeon: Conrad Corcoran, MD;  Location: [facility];  Service: Cardiovascular;  Laterality: N/A;  . TARSAL TUNNEL RELEASE Bilateral 08-2007     Medications: Current Meds  Medication Sig  . acetaminophen (TYLENOL) 500 MG tablet Take 500 mg by mouth every 6 (six) hours as needed for mild pain, moderate pain or headache.  Marland Kitchen acyclovir  (ZOVIRAX) 400 MG tablet Take 1 tablet (400 mg total) by mouth 2 (two) times daily.  Marland Kitchen ALPRAZolam (XANAX) 0.25 MG tablet Take 0.25 mg by mouth as needed for anxiety.  . benzonatate (TESSALON) 100 MG capsule One to two pills three times per day for cough as needed  . cyanocobalamin (,VITAMIN B-12,) 1000 MCG/ML injection Inject 1 mL (1,000 mcg total) into the muscle every 30 (thirty) days.  Marland Kitchen esomeprazole (NEXIUM) 40 MG capsule Take 40 mg by mouth 2 (two) times daily.  . fluticasone (FLONASE) 50 MCG/ACT nasal spray Place into the nose.  . gabapentin (NEURONTIN) 300 MG capsule Take 1 capsule (300 mg total) by mouth 2 (two) times daily.  Marland Kitchen HYDROcodone-acetaminophen (NORCO) 10-325 MG tablet take 1 tablet by mouth every 6 to 8 hours if needed for pain  . lidocaine (LIDODERM) 5 % apply patch to affected area once daily as directed for lower back pain  . lidocaine-prilocaine (EMLA) cream Apply to affected area once  . meclizine (ANTIVERT) 12.5 MG tablet Take 12.5 mg by mouth every 6 (six) hours as needed for dizziness.   . metFORMIN (GLUCOPHAGE-XR) 500 MG 24 hr tablet Take by mouth. Takes one tablet in AM and 2 tablets at night.  . metoprolol succinate (TOPROL XL) 25 MG 24 hr tablet Take 1 tablet (25 mg total) by mouth daily.  . nitroGLYCERIN (NITROSTAT) 0.4 MG SL tablet place 1 tablet under the tongue if needed every 5 minutes for chest pain  . ondansetron (ZOFRAN) 8 MG tablet Take 1 tablet (8 mg total) by mouth 2 (two) times daily as needed for refractory nausea / vomiting. Start on day 2 after bendamustine  . pregabalin (LYRICA) 100 MG capsule 1 cap three times daily and 2 caps at hs  . prochlorperazine (COMPAZINE) 10 MG tablet Take 1 tablet (10 mg total) by mouth every 6 (six) hours as needed (Nausea or vomiting).  . rosuvastatin (CRESTOR) 5 MG tablet Take 1 tablet (5 mg total) by mouth daily.  Marland Kitchen tetrahydrozoline 0.05 % ophthalmic solution Place 1 drop into both eyes as needed. Red eyes  . zolpidem  (AMBIEN) 10 MG tablet Take 10 mg by mouth at bedtime as needed for sleep.   . [DISCONTINUED] aspirin 325 MG tablet Take 325 mg by mouth daily.       Allergies: Allergies  Allergen Reactions  . Codeine Hives    Pt states he can take a few, more reaction with extended doses.    Social History: The patient  reports that he quit smoking about 7 months ago. His smoking use included Cigarettes. He has a 22.00 pack-year smoking history. He has quit using smokeless tobacco. He reports that he does not drink alcohol or use drugs.   Family History: The patient's family history includes Cancer in his mother; Heart disease  in his father; Heart failure (age of onset: 69) in his father; Lung cancer (age of onset: 36) in his mother.   Review of Systems: Please see the history of present illness.   Otherwise, the review of systems is positive for none.   All other systems are reviewed and negative.   Physical Exam: VS:  BP 138/70 (BP Location: Left Arm, Patient Position: Sitting, Cuff Size: Normal)   Pulse 67   Ht 6' (1.829 m)   Wt 231 lb 12.8 oz (105.1 kg)   BMI 31.44 kg/m  .  BMI Body mass index is 31.44 kg/m.  Wt Readings from Last 3 Encounters:  11/29/16 231 lb 12.8 oz (105.1 kg)  11/03/16 234 lb 12.8 oz (106.5 kg)  10/11/16 229 lb (103.9 kg)    General: Pleasant. Well developed, well nourished and in no acute distress.   HEENT: Normal.  Neck: Supple, no JVD, carotid bruits, or masses noted.  Cardiac: Regular rate and rhythm. No murmurs, rubs, or gallops. No edema.  Respiratory:  Lungs are clear to auscultation bilaterally with normal work of breathing.  GI: Soft and nontender.  MS: No deformity or atrophy. Gait and ROM intact.  Skin: Warm and dry. Color is normal.  Neuro:  Strength and sensation are intact and no gross focal deficits noted.  Psych: Alert, appropriate and with normal affect.   LABORATORY DATA:  EKG:  EKG is ordered today. This shows NSR.   Lab Results    Component Value Date   WBC 3.4 (L) 11/03/2016   HGB 13.4 11/03/2016   HCT 38.8 11/03/2016   PLT 51 (L) 11/03/2016   GLUCOSE 148 (H) 11/03/2016   CHOL 146 05/24/2016   TRIG 201 (H) 05/24/2016   HDL 27 (L) 05/24/2016   LDLDIRECT 117.6 08/22/2011   LDLCALC 79 05/24/2016   ALT 8 11/03/2016   AST 12 11/03/2016   NA 137 11/03/2016   K 4.1 11/03/2016   CL 101 05/24/2016   CREATININE 1.1 11/03/2016   BUN 12.2 11/03/2016   CO2 23 11/03/2016   TSH 2.00 08/15/2014   INR 1.0 09/22/2014   HGBA1C 7.2 (H) 10/30/2015     BNP (last 3 results) No results for input(s): BNP in the last 8760 hours.  ProBNP (last 3 results) No results for input(s): PROBNP in the last 8760 hours.   Other Studies Reviewed Today:  Coronary angiography from 09/2014:  Coronary dominance: right  Left mainstem: Normal.  Left anterior descending (LAD): The stent in the proximal LAD is widely patent throughout. There is moderate 20% narrowing in the LAD proximal to the stent. The remainder of the vessels without significant disease.  Left circumflex (LCx): The left circumflex gives rise to 2 large marginal branches. There is 20% narrowing prior to the takeoff of the first OM.  Right coronary artery (RCA): The right coronary is a dominant vessel. It has diffuse 70% disease in the proximal to mid vessel. Is occluded at the crux. There are excellent right to right and left to right collaterals.  Left ventriculography: Left ventricular systolic function is normal, LVEF is estimated at 55-65%, there is no significant mitral regurgitation  Final Conclusions:  1. Single vessel obstructive coronary disease. 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 The stent in the proximal LAD is widely patent.  2. Normal LV function.  Recommendations: Continue medical management.  Peter Martinique  08/26/2011, 9:09 AM   Assessment / Plan:  1. CAD - last  cath with stable findings back  in August of 2016. He has a chronic total occlusion of the RCA with good collateral flow - stent in the proximal LAD was patent. He was felt to be best served with medical management.  No longer on Imdur. No current chest pain - would continue with his current regimen.   2. HTN - BP is fine on his current regimen.   3. HLD - on statin therapy - labs from earlier this month noted in Care Everywhere.  4. ED - ok to use Viagra if he would like.    5. Tobacco abuse - resolved.   6. CLL/chronic thrombocytopenia - followed by oncology/hematology. Back on treatment.   7. NIDDM   8. Obesity - not discussed today.   9. Low testosterone level - with his known CAD - I would prefer he not be on therapy for this.   Current medicines are reviewed with the patient today.  The patient does not have concerns regarding medicines other than what has been noted above.  The following changes have been made:  See above.  Labs/ tests ordered today include:    Orders Placed This Encounter  Procedures  . EKG 12-Lead     Disposition:   FU with me in 6 months.   Patient is agreeable to this plan and will call if any problems develop in the interim.   SignedTruitt Merle, NP  11/29/2016 8:34 AM  [facility] [address] [place], [place]  [address] Phone: [phone] Fax: [phone]

## 2016-11-29 ENCOUNTER — Ambulatory Visit (INDEPENDENT_AMBULATORY_CARE_PROVIDER_SITE_OTHER): Payer: 59 | Admitting: Nurse Practitioner

## 2016-11-29 ENCOUNTER — Encounter: Payer: Self-pay | Admitting: Nurse Practitioner

## 2016-11-29 VITALS — BP 138/70 | HR 67 | Ht 72.0 in | Wt 231.8 lb

## 2016-11-29 DIAGNOSIS — E7800 Pure hypercholesterolemia, unspecified: Secondary | ICD-10-CM

## 2016-11-29 DIAGNOSIS — I259 Chronic ischemic heart disease, unspecified: Secondary | ICD-10-CM

## 2016-11-29 DIAGNOSIS — I10 Essential (primary) hypertension: Secondary | ICD-10-CM | POA: Diagnosis not present

## 2016-11-29 DIAGNOSIS — Z720 Tobacco use: Secondary | ICD-10-CM | POA: Diagnosis not present

## 2016-11-29 MED ORDER — ASPIRIN EC 81 MG PO TBEC: 81.0000 mg | DELAYED_RELEASE_TABLET | Freq: Every day | ORAL | Status: DC

## 2016-11-29 NOTE — Patient Instructions (Addendum)
We will be checking the following labs today - NONE   Medication Instructions:    Continue with your current medicines.     Testing/Procedures To Be Arranged:  N/A  Follow-Up:   See me in 6 months.    Other Special Instructions:   N/A    If you need a refill on your cardiac medications before your next appointment, please call your pharmacy.   Call the [facility] office at [phone] if you have any questions, problems or concerns.      

## 2016-12-01 ENCOUNTER — Ambulatory Visit (HOSPITAL_BASED_OUTPATIENT_CLINIC_OR_DEPARTMENT_OTHER): Payer: 59 | Admitting: Hematology and Oncology

## 2016-12-01 ENCOUNTER — Other Ambulatory Visit (HOSPITAL_BASED_OUTPATIENT_CLINIC_OR_DEPARTMENT_OTHER): Payer: 59

## 2016-12-01 ENCOUNTER — Encounter: Payer: Self-pay | Admitting: Hematology and Oncology

## 2016-12-01 ENCOUNTER — Ambulatory Visit (HOSPITAL_BASED_OUTPATIENT_CLINIC_OR_DEPARTMENT_OTHER): Payer: 59

## 2016-12-01 ENCOUNTER — Ambulatory Visit: Payer: 59

## 2016-12-01 ENCOUNTER — Telehealth: Payer: Self-pay

## 2016-12-01 VITALS — BP 111/63 | HR 59 | Temp 97.9°F | Resp 16

## 2016-12-01 VITALS — BP 131/62 | HR 65 | Temp 97.7°F | Resp 18 | Ht 72.0 in | Wt 232.2 lb

## 2016-12-01 DIAGNOSIS — K089 Disorder of teeth and supporting structures, unspecified: Secondary | ICD-10-CM | POA: Diagnosis not present

## 2016-12-01 DIAGNOSIS — C9110 Chronic lymphocytic leukemia of B-cell type not having achieved remission: Secondary | ICD-10-CM

## 2016-12-01 DIAGNOSIS — D61818 Other pancytopenia: Secondary | ICD-10-CM | POA: Diagnosis not present

## 2016-12-01 DIAGNOSIS — Z5112 Encounter for antineoplastic immunotherapy: Secondary | ICD-10-CM | POA: Diagnosis not present

## 2016-12-01 DIAGNOSIS — C9190 Lymphoid leukemia, unspecified not having achieved remission: Secondary | ICD-10-CM | POA: Diagnosis not present

## 2016-12-01 DIAGNOSIS — D229 Melanocytic nevi, unspecified: Secondary | ICD-10-CM

## 2016-12-01 DIAGNOSIS — R197 Diarrhea, unspecified: Secondary | ICD-10-CM | POA: Diagnosis not present

## 2016-12-01 DIAGNOSIS — Z5111 Encounter for antineoplastic chemotherapy: Secondary | ICD-10-CM | POA: Diagnosis not present

## 2016-12-01 DIAGNOSIS — D801 Nonfamilial hypogammaglobulinemia: Secondary | ICD-10-CM

## 2016-12-01 LAB — COMPREHENSIVE METABOLIC PANEL
ALT: 12 U/L (ref 0–55)
ANION GAP: 7 meq/L (ref 3–11)
AST: 13 U/L (ref 5–34)
Albumin: 3.6 g/dL (ref 3.5–5.0)
Alkaline Phosphatase: 58 U/L (ref 40–150)
BUN: 11.6 mg/dL (ref 7.0–26.0)
CALCIUM: 8.7 mg/dL (ref 8.4–10.4)
CHLORIDE: 107 meq/L (ref 98–109)
CO2: 25 mEq/L (ref 22–29)
Creatinine: 0.9 mg/dL (ref 0.7–1.3)
EGFR: >60 mL/min/{1.73_m2} (ref >60)
Glucose: 146 mg/dl — ABNORMAL HIGH (ref 70–140)
POTASSIUM: 3.9 meq/L (ref 3.5–5.1)
Sodium: 139 mEq/L (ref 136–145)
Total Bilirubin: 0.63 mg/dL (ref 0.20–1.20)
Total Protein: 5.6 g/dL — ABNORMAL LOW (ref 6.4–8.3)

## 2016-12-01 LAB — CBC WITH DIFFERENTIAL/PLATELET
BASO%: 0.3 % (ref 0.0–2.0)
BASOS ABS: 0 10*3/uL (ref 0.0–0.1)
EOS%: 4.9 % (ref 0.0–7.0)
Eosinophils Absolute: 0.1 10*3/uL (ref 0.0–0.5)
HEMATOCRIT: 37.2 % — AB (ref 38.4–49.9)
HGB: 12.8 g/dL — ABNORMAL LOW (ref 13.0–17.1)
LYMPH#: 0.7 10*3/uL — AB (ref 0.9–3.3)
LYMPH%: 25.1 % (ref 14.0–49.0)
MCH: 33.7 pg — AB (ref 27.2–33.4)
MCHC: 34.5 g/dL (ref 32.0–36.0)
MCV: 97.5 fL (ref 79.3–98.0)
MONO#: 0.3 10*3/uL (ref 0.1–0.9)
MONO%: 10.9 % (ref 0.0–14.0)
NEUT#: 1.7 10*3/uL (ref 1.5–6.5)
NEUT%: 58.8 % (ref 39.0–75.0)
PLATELETS: 63 10*3/uL — AB (ref 140–400)
RBC: 3.81 10*6/uL — ABNORMAL LOW (ref 4.20–5.82)
RDW: 16.2 % — ABNORMAL HIGH (ref 11.0–14.6)
WBC: 2.9 10*3/uL — ABNORMAL LOW (ref 4.0–10.3)

## 2016-12-01 MED ORDER — SODIUM CHLORIDE 0.9 % IV SOLN
Freq: Once | INTRAVENOUS | Status: AC
Start: 2016-12-01 — End: 2016-12-01
  Administered 2016-12-01: 09:00:00 via INTRAVENOUS

## 2016-12-01 MED ORDER — DIPHENHYDRAMINE HCL 25 MG PO CAPS
50.0000 mg | ORAL_CAPSULE | Freq: Once | ORAL | Status: AC
  Administered 2016-12-01: 50 mg via ORAL

## 2016-12-01 MED ORDER — DEXAMETHASONE SODIUM PHOSPHATE 10 MG/ML IJ SOLN
10.0000 mg | Freq: Once | INTRAMUSCULAR | Status: AC
  Administered 2016-12-01: 10 mg via INTRAVENOUS

## 2016-12-01 MED ORDER — BENDAMUSTINE HCL CHEMO INJECTION 100 MG/4ML
72.0000 mg/m2 | Freq: Once | INTRAVENOUS | Status: AC
  Administered 2016-12-01: 175 mg via INTRAVENOUS
  Filled 2016-12-01: qty 7

## 2016-12-01 MED ORDER — SODIUM CHLORIDE 0.9 % IV SOLN
375.0000 mg/m2 | Freq: Once | INTRAVENOUS | Status: AC
  Administered 2016-12-01: 900 mg via INTRAVENOUS
  Filled 2016-12-01: qty 50

## 2016-12-01 MED ORDER — ACETAMINOPHEN 325 MG PO TABS
ORAL_TABLET | ORAL | Status: AC
  Filled 2016-12-01: qty 2

## 2016-12-01 MED ORDER — FAMOTIDINE IN NACL 20-0.9 MG/50ML-% IV SOLN
20.0000 mg | Freq: Once | INTRAVENOUS | Status: AC
  Administered 2016-12-01: 20 mg via INTRAVENOUS

## 2016-12-01 MED ORDER — HEPARIN SOD (PORK) LOCK FLUSH 100 UNIT/ML IV SOLN
500.0000 [IU] | Freq: Once | INTRAVENOUS | Status: AC | PRN
  Administered 2016-12-01: 500 [IU]
  Filled 2016-12-01: qty 5

## 2016-12-01 MED ORDER — SODIUM CHLORIDE 0.9% FLUSH
10.0000 mL | INTRAVENOUS | Status: DC | PRN
  Administered 2016-12-01: 10 mL
  Filled 2016-12-01: qty 10

## 2016-12-01 MED ORDER — FAMOTIDINE IN NACL 20-0.9 MG/50ML-% IV SOLN
INTRAVENOUS | Status: AC
  Filled 2016-12-01: qty 50

## 2016-12-01 MED ORDER — PALONOSETRON HCL INJECTION 0.25 MG/5ML
INTRAVENOUS | Status: AC
  Filled 2016-12-01: qty 5

## 2016-12-01 MED ORDER — DIPHENHYDRAMINE HCL 25 MG PO CAPS
ORAL_CAPSULE | ORAL | Status: AC
  Filled 2016-12-01: qty 2

## 2016-12-01 MED ORDER — SODIUM CHLORIDE 0.9% FLUSH
10.0000 mL | Freq: Once | INTRAVENOUS | Status: AC
  Administered 2016-12-01: 10 mL
  Filled 2016-12-01: qty 10

## 2016-12-01 MED ORDER — DEXAMETHASONE SODIUM PHOSPHATE 10 MG/ML IJ SOLN
INTRAMUSCULAR | Status: AC
  Filled 2016-12-01: qty 1

## 2016-12-01 MED ORDER — ACETAMINOPHEN 325 MG PO TABS
650.0000 mg | ORAL_TABLET | Freq: Once | ORAL | Status: AC
  Administered 2016-12-01: 650 mg via ORAL

## 2016-12-01 MED ORDER — PALONOSETRON HCL INJECTION 0.25 MG/5ML
0.2500 mg | Freq: Once | INTRAVENOUS | Status: AC
  Administered 2016-12-01: 0.25 mg via INTRAVENOUS

## 2016-12-01 NOTE — Assessment & Plan Note (Signed)
He has acquired pancytopenia due to side effects of treatment I would continue reduced dose of chemotherapy by 20% He is instructed to reduce a dose of aspirin therapy to 81 mg due to severe thrombocytopenia and increased bruising

## 2016-12-01 NOTE — Assessment & Plan Note (Signed)
The patient had mild intermittent diarrhea I do not think it is related to treatment Recommend conservative management

## 2016-12-01 NOTE — Assessment & Plan Note (Signed)
The patient has benign skin mole I do not recommend surgical excision until after treatment is completed

## 2016-12-01 NOTE — Progress Notes (Signed)
[facility] OFFICE PROGRESS NOTE  Patient Care Team: [name]., PA-C as PCP - General (Family Medicine) Burtis Junes, NP as Nurse Practitioner (Cardiology) Carol Ada, MD as Consulting Physician (Gastroenterology)  SUMMARY OF ONCOLOGIC HISTORY:   CLL (chronic lymphocytic leukemia) ([place])   06/28/2009 Imaging    1.  No evidence of aortic dissection or other acute process in the chest. 2.  Centrilobular emphysema with a 5 mm right lung nodule. Given the concurrent centrilobular emphysema, follow-up chest CT at 6 -12 months is recommended.  3.  Coronary artery atherosclerosis which is age advanced. 4.  Prominent thoracic lymph nodes.  These can be reevaluated at follow-up.      05/06/2010 Imaging    1.  Multiple small periaortic lymph nodes consistent with the patient's history of the chronic lymphocytic leukemia. 2.  No evidence of solid organ involvement      11/03/2011 Imaging    1.  Interval progression of abdominal and pelvic adenopathy. 2.  Progression of splenomegaly.  The spleen now measures 23 cm in length      11/18/2011 Bone Marrow Biopsy    Bone Marrow, Aspirate,Biopsy, and Clot, right iliac bone - HYPERCELLULAR BONE MARROW WITH EXTENSIVE INVOLVEMENT BY CHRONIC LYMPHOCYTIC LEUKEMIA. PERIPHERAL BLOOD: - CHRONIC LYMPHOCYTIC LEUKEMIA      03/08/2012 Imaging    1.  Progressive increase in retroperitoneal, iliac, and inguinal lymphadenopathy. 2.  Interval increase in massive splenomegaly.       06/01/2012 Procedure    Placement of single lumen port a cath via right internal jugular vein.  The catheter tip lies at the cavoatrial junction.  A power injectable port a cath was placed and is ready for immediate use      06/20/2012 - 11/23/2012 Chemotherapy    He received FCR x 6 cycles      12/19/2012 Imaging    Left common iliac stent. Abdominal vasculature remains patent. Improving supraclavicular and axillary lymphadenopathy. Residual right  subpectoral nodes measure up to 10 mm short axis. Improving retroperitoneal lymphadenopathy, measuring up to 16 mm short axis. Improving splenomegaly, measuring 18.7 cm.       01/20/2016 Imaging    1. Stable exam.  No new or progressive findings. 2. No CT findings to explain odynophagia      09/02/2016 Pathology Results    The findings are consistent with involvement by previously known chronic lymphocytic leukemia      09/02/2016 Pathology Results    FISH for CLL came back positive for deletion 13q      09/15/2016 Imaging    1. Borderline enlarged abdominal and pelvic lymph nodes. Compared with 11/03/2011 these are decreased in size as detailed above. 2. Persistent splenomegaly. 3. Aortic Atherosclerosis (ICD10-I70.0). LAD coronary artery calcification noted.      10/06/2016 -  Chemotherapy    He received Bendamustine and Rituxan      11/03/2016 Adverse Reaction    Dose of Bendamustine is reduced due to severe pancytopenia       INTERVAL HISTORY: Please see below for problem oriented charting. He returns for further follow-up He had multiple little complaints He has intermittent diarrhea but not severe Appetite is stable, no recent significant weight loss He has a skin mole on the left lower abdominal wall that bothers him He did not describe any bleeding He has poor dentition and desire dental extraction  No new lymphadenopathy, fever or chills The patient denies any recent signs or symptoms of bleeding such as spontaneous  epistaxis, hematuria or hematochezia.  REVIEW OF SYSTEMS:   Constitutional: Denies fevers, chills or abnormal weight loss Eyes: Denies blurriness of vision Ears, nose, mouth, throat, and face: Denies mucositis or sore throat Respiratory: Denies cough, dyspnea or wheezes Cardiovascular: Denies palpitation, chest discomfort or lower extremity swelling Lymphatics: Denies new lymphadenopathy  Neurological:Denies numbness, tingling or new  weaknesses Behavioral/Psych: Mood is stable, no new changes  All other systems were reviewed with the patient and are negative.  I have reviewed the past medical history, past surgical history, social history and family history with the patient and they are unchanged from previous note.  ALLERGIES:  is allergic to codeine.  MEDICATIONS:  Current Outpatient Prescriptions  Medication Sig Dispense Refill  . acetaminophen (TYLENOL) 500 MG tablet Take 500 mg by mouth every 6 (six) hours as needed for mild pain, moderate pain or headache.    Marland Kitchen acyclovir (ZOVIRAX) 400 MG tablet Take 1 tablet (400 mg total) by mouth 2 (two) times daily. 60 tablet 3  . ALPRAZolam (XANAX) 0.25 MG tablet Take 0.25 mg by mouth as needed for anxiety.    Marland Kitchen aspirin EC 81 MG tablet Take 1 tablet (81 mg total) by mouth daily.    . benzonatate (TESSALON) 100 MG capsule One to two pills three times per day for cough as needed    . cyanocobalamin (,VITAMIN B-12,) 1000 MCG/ML injection Inject 1 mL (1,000 mcg total) into the muscle every 30 (thirty) days. 10 mL 1  . esomeprazole (NEXIUM) 40 MG capsule Take 40 mg by mouth 2 (two) times daily.  0  . fluticasone (FLONASE) 50 MCG/ACT nasal spray Place into the nose.    . gabapentin (NEURONTIN) 300 MG capsule Take 1 capsule (300 mg total) by mouth 2 (two) times daily. 180 capsule 1  . HYDROcodone-acetaminophen (NORCO) 10-325 MG tablet take 1 tablet by mouth every 6 to 8 hours if needed for pain  0  . lidocaine (LIDODERM) 5 % apply patch to affected area once daily as directed for lower back pain  0  . lidocaine-prilocaine (EMLA) cream Apply to affected area once 30 g 3  . meclizine (ANTIVERT) 12.5 MG tablet Take 12.5 mg by mouth every 6 (six) hours as needed for dizziness.   0  . metFORMIN (GLUCOPHAGE-XR) 500 MG 24 hr tablet Take by mouth. Takes one tablet in AM and 2 tablets at night.    . metoprolol succinate (TOPROL XL) 25 MG 24 hr tablet Take 1 tablet (25 mg total) by mouth daily.  90 tablet 3  . nitroGLYCERIN (NITROSTAT) 0.4 MG SL tablet place 1 tablet under the tongue if needed every 5 minutes for chest pain 25 tablet 3  . ondansetron (ZOFRAN) 8 MG tablet Take 1 tablet (8 mg total) by mouth 2 (two) times daily as needed for refractory nausea / vomiting. Start on day 2 after bendamustine 30 tablet 1  . pregabalin (LYRICA) 100 MG capsule 1 cap three times daily and 2 caps at hs 450 capsule 1  . prochlorperazine (COMPAZINE) 10 MG tablet Take 1 tablet (10 mg total) by mouth every 6 (six) hours as needed (Nausea or vomiting). 30 tablet 1  . rosuvastatin (CRESTOR) 5 MG tablet Take 1 tablet (5 mg total) by mouth daily. 90 tablet 3  . tetrahydrozoline 0.05 % ophthalmic solution Place 1 drop into both eyes as needed. Red eyes    . zolpidem (AMBIEN) 10 MG tablet Take 10 mg by mouth at bedtime as needed for sleep.  0   No current facility-administered medications for this visit.    Facility-Administered Medications Ordered in Other Visits  Medication Dose Route Frequency Provider Last Rate Last Dose  . 0.9 %  sodium chloride infusion   Intravenous Continuous Alvy Bimler, Lonie Rummell, MD 50 mL/hr at 03/07/14 1005    . bendamustine (BENDEKA) 175 mg in sodium chloride 0.9 % 50 mL (3.0702 mg/mL) chemo infusion  72 mg/m2 (Treatment Plan Recorded) Intravenous Once Tauriel Scronce, MD      . heparin lock flush 100 unit/mL  500 Units Intracatheter Once PRN Alvy Bimler, Marlon Vonruden, MD      . sodium chloride 0.9 % injection 10 mL  10 mL Intracatheter PRN Marcy Panning, MD   10 mL at 08/22/12 1721  . sodium chloride flush (NS) 0.9 % injection 10 mL  10 mL Intracatheter PRN Alvy Bimler, Issiac Jamar, MD        PHYSICAL EXAMINATION: ECOG PERFORMANCE STATUS: 1 - Symptomatic but completely ambulatory  Vitals:   12/01/16 0829  BP: 131/62  Pulse: 65  Resp: 18  Temp: 97.7 F (36.5 C)  SpO2: 98%   Filed Weights   12/01/16 0829  Weight: 232 lb 3.2 oz (105.3 kg)    GENERAL:alert, no distress and comfortable SKIN: skin color,  texture, turgor are normal, no rashes or significant lesions.  Noted benign skin mole EYES: normal, Conjunctiva are pink and non-injected, sclera clear OROPHARYNX:no exudate, no erythema and lips, buccal mucosa, and tongue normal  NECK: supple, thyroid normal size, non-tender, without nodularity LYMPH:  no palpable lymphadenopathy in the cervical, axillary or inguinal LUNGS: clear to auscultation and percussion with normal breathing effort HEART: regular rate & rhythm and no murmurs and no lower extremity edema ABDOMEN:abdomen soft, non-tender and normal bowel sounds Musculoskeletal:no cyanosis of digits and no clubbing  NEURO: alert & oriented x 3 with fluent speech, no focal motor/sensory deficits  LABORATORY DATA:  I have reviewed the data as listed    Component Value Date/Time   NA 139 12/01/2016 0812   K 3.9 12/01/2016 0812   CL 101 05/24/2016 0848   CL 104 08/03/2012 1229   CO2 25 12/01/2016 0812   GLUCOSE 146 (H) 12/01/2016 0812   GLUCOSE 223 (H) 08/03/2012 1229   BUN 11.6 12/01/2016 0812   CREATININE 0.9 12/01/2016 0812   CALCIUM 8.7 12/01/2016 0812   PROT 5.6 (L) 12/01/2016 0812   ALBUMIN 3.6 12/01/2016 0812   AST 13 12/01/2016 0812   ALT 12 12/01/2016 0812   ALKPHOS 58 12/01/2016 0812   BILITOT 0.63 12/01/2016 0812   GFRNONAA 82 05/24/2016 0848   GFRAA 94 05/24/2016 0848    No results found for: SPEP, UPEP  Lab Results  Component Value Date   WBC 2.9 (L) 12/01/2016   NEUTROABS 1.7 12/01/2016   HGB 12.8 (L) 12/01/2016   HCT 37.2 (L) 12/01/2016   MCV 97.5 12/01/2016   PLT 63 (L) 12/01/2016      Chemistry      Component Value Date/Time   NA 139 12/01/2016 0812   K 3.9 12/01/2016 0812   CL 101 05/24/2016 0848   CL 104 08/03/2012 1229   CO2 25 12/01/2016 0812   BUN 11.6 12/01/2016 0812   CREATININE 0.9 12/01/2016 0812      Component Value Date/Time   CALCIUM 8.7 12/01/2016 0812   ALKPHOS 58 12/01/2016 0812   AST 13 12/01/2016 0812   ALT 12 12/01/2016  0812   BILITOT 0.63 12/01/2016 0812      ASSESSMENT &  PLAN:  CLL (chronic lymphocytic leukemia) He tolerated cycle 1 well except for pancytopenia and mild nausea I will reduce a dose of bendamustine moving forward We will continue treatment for minimum 6 cycles I plan to repeat imaging study before next cycle of treatment He is reminded to take acyclovir for antimicrobial prophylaxis  Pancytopenia, acquired ([place]) He has acquired pancytopenia due to side effects of treatment I would continue reduced dose of chemotherapy by 20% He is instructed to reduce a dose of aspirin therapy to 81 mg due to severe thrombocytopenia and increased bruising  Diarrhea The patient had mild intermittent diarrhea I do not think it is related to treatment Recommend conservative management  Poor dentition He has poor dentition, and he desire dental extraction Due to ongoing chemotherapy and pancytopenia, I recommend defer dental surgery until after treatment is completed  Benign mole The patient has benign skin mole I do not recommend surgical excision until after treatment is completed   Orders Placed This Encounter  Procedures  . CT ABDOMEN PELVIS W CONTRAST    Standing Status:   Future    Standing Expiration Date:   12/01/2017    Order Specific Question:   If indicated for the ordered procedure, I authorize the administration of contrast media per Radiology protocol    Answer:   Yes    Order Specific Question:   Preferred imaging location?    Answer:   [facility]    Order Specific Question:   Radiology Contrast Protocol - do NOT remove file path    Answer:   [email or web address]   All questions were answered. The patient knows to call the clinic with any problems, questions or concerns. No barriers to learning was detected. I spent 25 minutes counseling the patient face to face. The total time spent in the appointment was 30 minutes and more than 50% was on  counseling and review of test results     Ryan Lark, MD 12/01/2016 1:29 PM

## 2016-12-01 NOTE — Assessment & Plan Note (Signed)
He tolerated cycle 1 well except for pancytopenia and mild nausea I will reduce a dose of bendamustine moving forward We will continue treatment for minimum 6 cycles I plan to repeat imaging study before next cycle of treatment He is reminded to take acyclovir for antimicrobial prophylaxis

## 2016-12-01 NOTE — Telephone Encounter (Signed)
Faxed letters to Ryan Copeland for medical need for IVIG.

## 2016-12-01 NOTE — Patient Instructions (Signed)
[facility] Discharge Instructions for Patients Receiving Chemotherapy  Today you received the following chemotherapy agents:  Rituxan and Bendeka.  To help prevent nausea and vomiting after your treatment, we encourage you to take your nausea medication as directed.   If you develop nausea and vomiting that is not controlled by your nausea medication, call the clinic.   BELOW ARE SYMPTOMS THAT SHOULD BE REPORTED IMMEDIATELY:  *FEVER GREATER THAN 100.5 F  *CHILLS WITH OR WITHOUT FEVER  NAUSEA AND VOMITING THAT IS NOT CONTROLLED WITH YOUR NAUSEA MEDICATION  *UNUSUAL SHORTNESS OF BREATH  *UNUSUAL BRUISING OR BLEEDING  TENDERNESS IN MOUTH AND THROAT WITH OR WITHOUT PRESENCE OF ULCERS  *URINARY PROBLEMS  *BOWEL PROBLEMS  UNUSUAL RASH Items with * indicate a potential emergency and should be followed up as soon as possible.  Feel free to call the clinic should you have any questions or concerns. The clinic phone number is [phone].  Please show the CHEMO ALERT CARD at check-in to the Emergency Department and triage nurse.   

## 2016-12-01 NOTE — Assessment & Plan Note (Signed)
He has poor dentition, and he desire dental extraction Due to ongoing chemotherapy and pancytopenia, I recommend defer dental surgery until after treatment is completed

## 2016-12-02 ENCOUNTER — Telehealth: Payer: Self-pay | Admitting: Hematology and Oncology

## 2016-12-02 ENCOUNTER — Ambulatory Visit (HOSPITAL_BASED_OUTPATIENT_CLINIC_OR_DEPARTMENT_OTHER): Payer: 59

## 2016-12-02 VITALS — BP 146/62 | HR 62 | Temp 97.7°F | Resp 16

## 2016-12-02 DIAGNOSIS — C9190 Lymphoid leukemia, unspecified not having achieved remission: Secondary | ICD-10-CM

## 2016-12-02 DIAGNOSIS — Z5111 Encounter for antineoplastic chemotherapy: Secondary | ICD-10-CM | POA: Diagnosis not present

## 2016-12-02 DIAGNOSIS — C9110 Chronic lymphocytic leukemia of B-cell type not having achieved remission: Secondary | ICD-10-CM

## 2016-12-02 MED ORDER — HEPARIN SOD (PORK) LOCK FLUSH 100 UNIT/ML IV SOLN
500.0000 [IU] | Freq: Once | INTRAVENOUS | Status: AC | PRN
  Administered 2016-12-02: 500 [IU]
  Filled 2016-12-02: qty 5

## 2016-12-02 MED ORDER — SODIUM CHLORIDE 0.9 % IV SOLN
Freq: Once | INTRAVENOUS | Status: AC
  Administered 2016-12-02: 08:00:00 via INTRAVENOUS

## 2016-12-02 MED ORDER — SODIUM CHLORIDE 0.9 % IV SOLN
72.0000 mg/m2 | Freq: Once | INTRAVENOUS | Status: AC
  Administered 2016-12-02: 175 mg via INTRAVENOUS
  Filled 2016-12-02: qty 7

## 2016-12-02 MED ORDER — SODIUM CHLORIDE 0.9% FLUSH
10.0000 mL | INTRAVENOUS | Status: DC | PRN
  Administered 2016-12-02: 10 mL
  Filled 2016-12-02: qty 10

## 2016-12-02 MED ORDER — DEXAMETHASONE SODIUM PHOSPHATE 10 MG/ML IJ SOLN
10.0000 mg | Freq: Once | INTRAMUSCULAR | Status: AC
  Administered 2016-12-02: 10 mg via INTRAVENOUS

## 2016-12-02 MED ORDER — DEXAMETHASONE SODIUM PHOSPHATE 10 MG/ML IJ SOLN
INTRAMUSCULAR | Status: AC
  Filled 2016-12-02: qty 1

## 2016-12-02 NOTE — Patient Instructions (Signed)
[facility] Discharge Instructions for Patients Receiving Chemotherapy  Today you received the following chemotherapy agents Bendeka.  To help prevent nausea and vomiting after your treatment, we encourage you to take your nausea medication as directed.   If you develop nausea and vomiting that is not controlled by your nausea medication, call the clinic.   BELOW ARE SYMPTOMS THAT SHOULD BE REPORTED IMMEDIATELY:  *FEVER GREATER THAN 100.5 F  *CHILLS WITH OR WITHOUT FEVER  NAUSEA AND VOMITING THAT IS NOT CONTROLLED WITH YOUR NAUSEA MEDICATION  *UNUSUAL SHORTNESS OF BREATH  *UNUSUAL BRUISING OR BLEEDING  TENDERNESS IN MOUTH AND THROAT WITH OR WITHOUT PRESENCE OF ULCERS  *URINARY PROBLEMS  *BOWEL PROBLEMS  UNUSUAL RASH Items with * indicate a potential emergency and should be followed up as soon as possible.  Feel free to call the clinic should you have any questions or concerns. The clinic phone number is [phone].  Please show the CHEMO ALERT CARD at check-in to the Emergency Department and triage nurse.   

## 2016-12-02 NOTE — Telephone Encounter (Signed)
Called patient regarding November schedule

## 2016-12-07 ENCOUNTER — Encounter: Payer: Self-pay | Admitting: Nurse Practitioner

## 2016-12-07 ENCOUNTER — Other Ambulatory Visit: Payer: Self-pay | Admitting: *Deleted

## 2016-12-07 MED ORDER — NITROGLYCERIN 0.4 MG SL SUBL: 0.4000 mg | SUBLINGUAL_TABLET | SUBLINGUAL | 3 refills | Status: DC | PRN

## 2016-12-07 MED ORDER — SILDENAFIL CITRATE 100 MG PO TABS: 50.0000 mg | ORAL_TABLET | Freq: Every day | ORAL | 6 refills | Status: DC | PRN

## 2016-12-07 NOTE — Telephone Encounter (Signed)
Ok to refill Viagra  Viagra 100 mg #10 to take 1/2 tablet to whole tablet 30 minutes prior to sexual intercourse.  6 refills ok.

## 2016-12-13 ENCOUNTER — Encounter: Payer: Self-pay | Admitting: Hematology and Oncology

## 2016-12-28 ENCOUNTER — Ambulatory Visit (HOSPITAL_COMMUNITY)
Admission: RE | Admit: 2016-12-28 | Discharge: 2016-12-28 | Disposition: A | Discharge status: Home / Self Care | Payer: 59 | Source: Ambulatory Visit | Attending: Hematology and Oncology | Admitting: Hematology and Oncology

## 2016-12-28 ENCOUNTER — Encounter (HOSPITAL_COMMUNITY): Payer: Self-pay

## 2016-12-28 DIAGNOSIS — C9110 Chronic lymphocytic leukemia of B-cell type not having achieved remission: Secondary | ICD-10-CM

## 2016-12-28 DIAGNOSIS — R590 Localized enlarged lymph nodes: Secondary | ICD-10-CM | POA: Insufficient documentation

## 2016-12-28 DIAGNOSIS — I700 Atherosclerosis of aorta: Secondary | ICD-10-CM | POA: Insufficient documentation

## 2016-12-28 DIAGNOSIS — C9190 Lymphoid leukemia, unspecified not having achieved remission: Secondary | ICD-10-CM | POA: Diagnosis not present

## 2016-12-28 DIAGNOSIS — R161 Splenomegaly, not elsewhere classified: Secondary | ICD-10-CM | POA: Diagnosis not present

## 2016-12-28 MED ORDER — HEPARIN SOD (PORK) LOCK FLUSH 100 UNIT/ML IV SOLN
500.0000 [IU] | Freq: Once | INTRAVENOUS | Status: AC
  Administered 2016-12-28: 500 [IU] via INTRAVENOUS

## 2016-12-28 MED ORDER — IOPAMIDOL (ISOVUE-300) INJECTION 61%
100.0000 mL | Freq: Once | INTRAVENOUS | Status: AC | PRN
  Administered 2016-12-28: 100 mL via INTRAVENOUS

## 2016-12-28 MED ORDER — IOPAMIDOL (ISOVUE-370) INJECTION 76%
INTRAVENOUS | Status: AC
  Filled 2016-12-28: qty 100

## 2016-12-28 MED ORDER — HEPARIN SOD (PORK) LOCK FLUSH 100 UNIT/ML IV SOLN
INTRAVENOUS | Status: AC
  Filled 2016-12-28: qty 5

## 2016-12-29 ENCOUNTER — Other Ambulatory Visit (HOSPITAL_BASED_OUTPATIENT_CLINIC_OR_DEPARTMENT_OTHER): Payer: 59

## 2016-12-29 ENCOUNTER — Ambulatory Visit (HOSPITAL_BASED_OUTPATIENT_CLINIC_OR_DEPARTMENT_OTHER): Payer: 59

## 2016-12-29 ENCOUNTER — Ambulatory Visit (HOSPITAL_BASED_OUTPATIENT_CLINIC_OR_DEPARTMENT_OTHER): Payer: 59 | Admitting: Hematology and Oncology

## 2016-12-29 ENCOUNTER — Encounter: Payer: Self-pay | Admitting: Hematology and Oncology

## 2016-12-29 ENCOUNTER — Ambulatory Visit: Payer: 59

## 2016-12-29 VITALS — BP 129/61 | HR 60 | Temp 98.6°F | Resp 17

## 2016-12-29 DIAGNOSIS — C9190 Lymphoid leukemia, unspecified not having achieved remission: Secondary | ICD-10-CM

## 2016-12-29 DIAGNOSIS — D801 Nonfamilial hypogammaglobulinemia: Secondary | ICD-10-CM

## 2016-12-29 DIAGNOSIS — C9110 Chronic lymphocytic leukemia of B-cell type not having achieved remission: Secondary | ICD-10-CM

## 2016-12-29 DIAGNOSIS — D732 Chronic congestive splenomegaly: Secondary | ICD-10-CM | POA: Diagnosis not present

## 2016-12-29 DIAGNOSIS — Z5112 Encounter for antineoplastic immunotherapy: Secondary | ICD-10-CM | POA: Diagnosis not present

## 2016-12-29 DIAGNOSIS — Z5111 Encounter for antineoplastic chemotherapy: Secondary | ICD-10-CM | POA: Diagnosis not present

## 2016-12-29 DIAGNOSIS — D61818 Other pancytopenia: Secondary | ICD-10-CM

## 2016-12-29 LAB — CBC WITH DIFFERENTIAL/PLATELET
BASO%: 0.3 % (ref 0.0–2.0)
Basophils Absolute: 0 10*3/uL (ref 0.0–0.1)
EOS%: 2.1 % (ref 0.0–7.0)
Eosinophils Absolute: 0.1 10*3/uL (ref 0.0–0.5)
HEMATOCRIT: 38.2 % — AB (ref 38.4–49.9)
HGB: 13.4 g/dL (ref 13.0–17.1)
LYMPH%: 19.5 % (ref 14.0–49.0)
MCH: 34.2 pg — AB (ref 27.2–33.4)
MCHC: 35.1 g/dL (ref 32.0–36.0)
MCV: 97.4 fL (ref 79.3–98.0)
MONO#: 0.3 10*3/uL (ref 0.1–0.9)
MONO%: 8.1 % (ref 0.0–14.0)
NEUT#: 2.3 10*3/uL (ref 1.5–6.5)
NEUT%: 70 % (ref 39.0–75.0)
PLATELETS: 66 10*3/uL — AB (ref 140–400)
RBC: 3.92 10*6/uL — ABNORMAL LOW (ref 4.20–5.82)
RDW: 15.7 % — ABNORMAL HIGH (ref 11.0–14.6)
WBC: 3.3 10*3/uL — AB (ref 4.0–10.3)
lymph#: 0.7 10*3/uL — ABNORMAL LOW (ref 0.9–3.3)

## 2016-12-29 LAB — COMPREHENSIVE METABOLIC PANEL
ALBUMIN: 3.6 g/dL (ref 3.5–5.0)
ALK PHOS: 75 U/L (ref 40–150)
ALT: 15 U/L (ref 0–55)
AST: 12 U/L (ref 5–34)
Anion Gap: 7 mEq/L (ref 3–11)
BUN: 7 mg/dL (ref 7.0–26.0)
CALCIUM: 8.8 mg/dL (ref 8.4–10.4)
CO2: 26 mEq/L (ref 22–29)
CREATININE: 0.8 mg/dL (ref 0.7–1.3)
Chloride: 105 mEq/L (ref 98–109)
EGFR: >60 mL/min/{1.73_m2} (ref >60)
GLUCOSE: 145 mg/dL — AB (ref 70–140)
Potassium: 3.8 mEq/L (ref 3.5–5.1)
SODIUM: 138 meq/L (ref 136–145)
TOTAL PROTEIN: 5.8 g/dL — AB (ref 6.4–8.3)
Total Bilirubin: 0.68 mg/dL (ref 0.20–1.20)

## 2016-12-29 MED ORDER — DEXAMETHASONE SODIUM PHOSPHATE 10 MG/ML IJ SOLN
10.0000 mg | Freq: Once | INTRAMUSCULAR | Status: AC
  Administered 2016-12-29: 10 mg via INTRAVENOUS

## 2016-12-29 MED ORDER — FAMOTIDINE IN NACL 20-0.9 MG/50ML-% IV SOLN
INTRAVENOUS | Status: AC
  Filled 2016-12-29: qty 50

## 2016-12-29 MED ORDER — DEXAMETHASONE SODIUM PHOSPHATE 10 MG/ML IJ SOLN
INTRAMUSCULAR | Status: AC
  Filled 2016-12-29: qty 1

## 2016-12-29 MED ORDER — DIPHENHYDRAMINE HCL 25 MG PO CAPS
ORAL_CAPSULE | ORAL | Status: AC
  Filled 2016-12-29: qty 2

## 2016-12-29 MED ORDER — FAMOTIDINE IN NACL 20-0.9 MG/50ML-% IV SOLN
20.0000 mg | Freq: Once | INTRAVENOUS | Status: AC
Start: 2016-12-29 — End: 2016-12-29
  Administered 2016-12-29: 20 mg via INTRAVENOUS

## 2016-12-29 MED ORDER — ACETAMINOPHEN 325 MG PO TABS
650.0000 mg | ORAL_TABLET | Freq: Once | ORAL | Status: AC
  Administered 2016-12-29: 650 mg via ORAL

## 2016-12-29 MED ORDER — AMOXICILLIN 500 MG PO TABS: 500.0000 mg | ORAL_TABLET | Freq: Two times a day (BID) | ORAL | 0 refills | Status: DC

## 2016-12-29 MED ORDER — ACYCLOVIR 400 MG PO TABS: 400.0000 mg | ORAL_TABLET | Freq: Every day | ORAL | 3 refills | Status: DC

## 2016-12-29 MED ORDER — SODIUM CHLORIDE 0.9% FLUSH
10.0000 mL | INTRAVENOUS | Status: DC | PRN
  Administered 2016-12-29: 10 mL
  Filled 2016-12-29: qty 10

## 2016-12-29 MED ORDER — ACETAMINOPHEN 325 MG PO TABS
ORAL_TABLET | ORAL | Status: AC
  Filled 2016-12-29: qty 2

## 2016-12-29 MED ORDER — SODIUM CHLORIDE 0.9% FLUSH
10.0000 mL | Freq: Once | INTRAVENOUS | Status: AC
  Administered 2016-12-29: 10 mL
  Filled 2016-12-29: qty 10

## 2016-12-29 MED ORDER — HEPARIN SOD (PORK) LOCK FLUSH 100 UNIT/ML IV SOLN
500.0000 [IU] | Freq: Once | INTRAVENOUS | Status: AC | PRN
Start: 2016-12-29 — End: 2016-12-29
  Administered 2016-12-29: 500 [IU]
  Filled 2016-12-29: qty 5

## 2016-12-29 MED ORDER — SODIUM CHLORIDE 0.9 % IV SOLN
Freq: Once | INTRAVENOUS | Status: AC
  Administered 2016-12-29: 12:00:00 via INTRAVENOUS

## 2016-12-29 MED ORDER — PALONOSETRON HCL INJECTION 0.25 MG/5ML
0.2500 mg | Freq: Once | INTRAVENOUS | Status: AC
  Administered 2016-12-29: 0.25 mg via INTRAVENOUS

## 2016-12-29 MED ORDER — SODIUM CHLORIDE 0.9 % IV SOLN
375.0000 mg/m2 | Freq: Once | INTRAVENOUS | Status: AC
  Administered 2016-12-29: 900 mg via INTRAVENOUS
  Filled 2016-12-29: qty 50

## 2016-12-29 MED ORDER — DIPHENHYDRAMINE HCL 25 MG PO CAPS
50.0000 mg | ORAL_CAPSULE | Freq: Once | ORAL | Status: AC
  Administered 2016-12-29: 50 mg via ORAL

## 2016-12-29 MED ORDER — PALONOSETRON HCL INJECTION 0.25 MG/5ML
INTRAVENOUS | Status: AC
  Filled 2016-12-29: qty 5

## 2016-12-29 MED ORDER — SODIUM CHLORIDE 0.9 % IV SOLN
54.0000 mg/m2 | Freq: Once | INTRAVENOUS | Status: AC
  Administered 2016-12-29: 125 mg via INTRAVENOUS
  Filled 2016-12-29: qty 5

## 2016-12-29 MED ORDER — PREDNISONE 50 MG PO TABS: 50.0000 mg | ORAL_TABLET | Freq: Every day | ORAL | 0 refills | Status: DC

## 2016-12-29 NOTE — Progress Notes (Signed)
[facility] OFFICE PROGRESS NOTE  Patient Care Team: [name]., PA-C as PCP - General (Family Medicine) Burtis Junes, NP as Nurse Practitioner (Cardiology) Carol Ada, MD as Consulting Physician (Gastroenterology)  SUMMARY OF ONCOLOGIC HISTORY:   CLL (chronic lymphocytic leukemia) ([place])   06/28/2009 Imaging    1.  No evidence of aortic dissection or other acute process in the chest. 2.  Centrilobular emphysema with a 5 mm right lung nodule. Given the concurrent centrilobular emphysema, follow-up chest CT at 6 -12 months is recommended.  3.  Coronary artery atherosclerosis which is age advanced. 4.  Prominent thoracic lymph nodes.  These can be reevaluated at follow-up.      05/06/2010 Imaging    1.  Multiple small periaortic lymph nodes consistent with the patient's history of the chronic lymphocytic leukemia. 2.  No evidence of solid organ involvement      11/03/2011 Imaging    1.  Interval progression of abdominal and pelvic adenopathy. 2.  Progression of splenomegaly.  The spleen now measures 23 cm in length      11/18/2011 Bone Marrow Biopsy    Bone Marrow, Aspirate,Biopsy, and Clot, right iliac bone - HYPERCELLULAR BONE MARROW WITH EXTENSIVE INVOLVEMENT BY CHRONIC LYMPHOCYTIC LEUKEMIA. PERIPHERAL BLOOD: - CHRONIC LYMPHOCYTIC LEUKEMIA      03/08/2012 Imaging    1.  Progressive increase in retroperitoneal, iliac, and inguinal lymphadenopathy. 2.  Interval increase in massive splenomegaly.       06/01/2012 Procedure    Placement of single lumen port a cath via right internal jugular vein.  The catheter tip lies at the cavoatrial junction.  A power injectable port a cath was placed and is ready for immediate use      06/20/2012 - 11/23/2012 Chemotherapy    He received FCR x 6 cycles      12/19/2012 Imaging    Left common iliac stent. Abdominal vasculature remains patent. Improving supraclavicular and axillary lymphadenopathy. Residual right  subpectoral nodes measure up to 10 mm short axis. Improving retroperitoneal lymphadenopathy, measuring up to 16 mm short axis. Improving splenomegaly, measuring 18.7 cm.       01/20/2016 Imaging    1. Stable exam.  No new or progressive findings. 2. No CT findings to explain odynophagia      09/02/2016 Pathology Results    The findings are consistent with involvement by previously known chronic lymphocytic leukemia      09/02/2016 Pathology Results    FISH for CLL came back positive for deletion 13q      09/15/2016 Imaging    1. Borderline enlarged abdominal and pelvic lymph nodes. Compared with 11/03/2011 these are decreased in size as detailed above. 2. Persistent splenomegaly. 3. Aortic Atherosclerosis (ICD10-I70.0). LAD coronary artery calcification noted.      10/06/2016 -  Chemotherapy    He received Bendamustine and Rituxan      11/03/2016 Adverse Reaction    Dose of Bendamustine is reduced due to severe pancytopenia      12/28/2016 Imaging    1. Borderline enlarged abdominal peritoneal ligament and abdominal retroperitoneal lymph nodes, stable. 2. Splenomegaly. 3.  Aortic atherosclerosis (ICD10-170.0).       INTERVAL HISTORY: Please see below for problem oriented charting. He returns today, to be seen prior to cycle 4 of chemotherapy He has persistent chronic upper respiratory tract infection with nasal drainage, cough without significant fever or chills He has been present for several weeks without resolution He denies chest pain or shortness  of breath Appetite is stable He complained of significant fatigue The patient denies any recent signs or symptoms of bleeding such as spontaneous epistaxis, hematuria or hematochezia.  REVIEW OF SYSTEMS:   Constitutional: Denies fevers, chills or abnormal weight loss Eyes: Denies blurriness of vision Ears, nose, mouth, throat, and face: Denies mucositis or sore throat Cardiovascular: Denies palpitation, chest discomfort or  lower extremity swelling Gastrointestinal:  Denies nausea, heartburn or change in bowel habits Skin: Denies abnormal skin rashes Lymphatics: Denies new lymphadenopathy or easy bruising Neurological:Denies numbness, tingling or new weaknesses Behavioral/Psych: Mood is stable, no new changes  All other systems were reviewed with the patient and are negative.  I have reviewed the past medical history, past surgical history, social history and family history with the patient and they are unchanged from previous note.  ALLERGIES:  is allergic to codeine.  MEDICATIONS:  Current Outpatient Medications  Medication Sig Dispense Refill  . acetaminophen (TYLENOL) 500 MG tablet Take 500 mg by mouth every 6 (six) hours as needed for mild pain, moderate pain or headache.    Marland Kitchen acyclovir (ZOVIRAX) 400 MG tablet Take 1 tablet (400 mg total) daily by mouth. 90 tablet 3  . ALPRAZolam (XANAX) 0.25 MG tablet Take 0.25 mg by mouth as needed for anxiety.    Marland Kitchen amoxicillin (AMOXIL) 500 MG tablet Take 1 tablet (500 mg total) 2 (two) times daily by mouth. 14 tablet 0  . aspirin EC 81 MG tablet Take 1 tablet (81 mg total) by mouth daily.    . benzonatate (TESSALON) 100 MG capsule One to two pills three times per day for cough as needed    . cyanocobalamin (,VITAMIN B-12,) 1000 MCG/ML injection Inject 1 mL (1,000 mcg total) into the muscle every 30 (thirty) days. 10 mL 1  . esomeprazole (NEXIUM) 40 MG capsule Take 40 mg by mouth 2 (two) times daily.  0  . fluticasone (FLONASE) 50 MCG/ACT nasal spray Place into the nose.    . gabapentin (NEURONTIN) 300 MG capsule Take 1 capsule (300 mg total) by mouth 2 (two) times daily. 180 capsule 1  . HYDROcodone-acetaminophen (NORCO) 10-325 MG tablet take 1 tablet by mouth every 6 to 8 hours if needed for pain  0  . lidocaine (LIDODERM) 5 % apply patch to affected area once daily as directed for lower back pain  0  . lidocaine-prilocaine (EMLA) cream Apply to affected area once 30  g 3  . meclizine (ANTIVERT) 12.5 MG tablet Take 12.5 mg by mouth every 6 (six) hours as needed for dizziness.   0  . metFORMIN (GLUCOPHAGE-XR) 500 MG 24 hr tablet Take by mouth. Takes one tablet in AM and 2 tablets at night.    . metoprolol succinate (TOPROL XL) 25 MG 24 hr tablet Take 1 tablet (25 mg total) by mouth daily. 90 tablet 3  . nitroGLYCERIN (NITROSTAT) 0.4 MG SL tablet Place 1 tablet (0.4 mg total) under the tongue every 5 (five) minutes x 3 doses as needed for chest pain (do not exceed 3 doses). 25 tablet 3  . ondansetron (ZOFRAN) 8 MG tablet Take 1 tablet (8 mg total) by mouth 2 (two) times daily as needed for refractory nausea / vomiting. Start on day 2 after bendamustine 30 tablet 1  . predniSONE (DELTASONE) 50 MG tablet Take 1 tablet (50 mg total) daily with breakfast by mouth. 7 tablet 0  . pregabalin (LYRICA) 100 MG capsule 1 cap three times daily and 2 caps at hs 450  capsule 1  . prochlorperazine (COMPAZINE) 10 MG tablet Take 1 tablet (10 mg total) by mouth every 6 (six) hours as needed (Nausea or vomiting). 30 tablet 1  . rosuvastatin (CRESTOR) 5 MG tablet Take 1 tablet (5 mg total) by mouth daily. 90 tablet 3  . sildenafil (VIAGRA) 100 MG tablet Take 0.5-1 tablets (50-100 mg total) by mouth daily as needed for erectile dysfunction (30 minutes prior to sexual intercourse). 10 tablet 6  . tetrahydrozoline 0.05 % ophthalmic solution Place 1 drop into both eyes as needed. Red eyes    . zolpidem (AMBIEN) 10 MG tablet Take 10 mg by mouth at bedtime as needed for sleep.   0   No current facility-administered medications for this visit.    Facility-Administered Medications Ordered in Other Visits  Medication Dose Route Frequency Provider Last Rate Last Dose  . 0.9 %  sodium chloride infusion   Intravenous Continuous Alvy Bimler, Herta Hink, MD 50 mL/hr at 03/07/14 1005    . bendamustine (BENDEKA) 125 mg in sodium chloride 0.9 % 50 mL (2.2727 mg/mL) chemo infusion  54 mg/m2 (Treatment Plan  Recorded) Intravenous Once Alvy Bimler, Kaarin Pardy, MD      . heparin lock flush 100 unit/mL  500 Units Intracatheter Once PRN Alvy Bimler, Tephanie Escorcia, MD      . riTUXimab (RITUXAN) 900 mg in sodium chloride 0.9 % 250 mL (2.6471 mg/mL) chemo infusion  375 mg/m2 (Treatment Plan Recorded) Intravenous Once Sagan Wurzel, MD      . sodium chloride 0.9 % injection 10 mL  10 mL Intracatheter PRN Marcy Panning, MD   10 mL at 08/22/12 1721  . sodium chloride flush (NS) 0.9 % injection 10 mL  10 mL Intracatheter PRN Alvy Bimler, Kayo Zion, MD        PHYSICAL EXAMINATION: ECOG PERFORMANCE STATUS: 2 - Symptomatic, <50% confined to bed  Vitals:   12/29/16 1012  BP: 125/73  Pulse: 61  Resp: 18  Temp: 98 F (36.7 C)  SpO2: 96%   Filed Weights   12/29/16 1012  Weight: 227 lb 12.8 oz (103.3 kg)    GENERAL:alert, no distress and comfortable SKIN: skin color, texture, turgor are normal, no rashes or significant lesions EYES: normal, Conjunctiva are pink and non-injected, sclera clear OROPHARYNX:no exudate, no erythema and lips, buccal mucosa, and tongue normal  NECK: supple, thyroid normal size, non-tender, without nodularity LYMPH:  no palpable lymphadenopathy in the cervical, axillary or inguinal LUNGS: Bilateral wheezes. HEART: regular rate & rhythm and no murmurs and no lower extremity edema ABDOMEN:abdomen soft, non-tender and normal bowel sounds Musculoskeletal:no cyanosis of digits and no clubbing  NEURO: alert & oriented x 3 with fluent speech, no focal motor/sensory deficits  LABORATORY DATA:  I have reviewed the data as listed    Component Value Date/Time   NA 138 12/29/2016 0934   K 3.8 12/29/2016 0934   CL 101 05/24/2016 0848   CL 104 08/03/2012 1229   CO2 26 12/29/2016 0934   GLUCOSE 145 (H) 12/29/2016 0934   GLUCOSE 223 (H) 08/03/2012 1229   BUN 7.0 12/29/2016 0934   CREATININE 0.8 12/29/2016 0934   CALCIUM 8.8 12/29/2016 0934   PROT 5.8 (L) 12/29/2016 0934   ALBUMIN 3.6 12/29/2016 0934   AST 12  12/29/2016 0934   ALT 15 12/29/2016 0934   ALKPHOS 75 12/29/2016 0934   BILITOT 0.68 12/29/2016 0934   GFRNONAA 82 05/24/2016 0848   GFRAA 94 05/24/2016 0848    No results found for: SPEP, UPEP  Lab Results  Component Value Date   WBC 3.3 (L) 12/29/2016   NEUTROABS 2.3 12/29/2016   HGB 13.4 12/29/2016   HCT 38.2 (L) 12/29/2016   MCV 97.4 12/29/2016   PLT 66 (L) 12/29/2016      Chemistry      Component Value Date/Time   NA 138 12/29/2016 0934   K 3.8 12/29/2016 0934   CL 101 05/24/2016 0848   CL 104 08/03/2012 1229   CO2 26 12/29/2016 0934   BUN 7.0 12/29/2016 0934   CREATININE 0.8 12/29/2016 0934      Component Value Date/Time   CALCIUM 8.8 12/29/2016 0934   ALKPHOS 75 12/29/2016 0934   AST 12 12/29/2016 0934   ALT 15 12/29/2016 0934   BILITOT 0.68 12/29/2016 0934       RADIOGRAPHIC STUDIES: I have reviewed multiple imaging study with the patient I have personally reviewed the radiological images as listed and agreed with the findings in the report. Ct Abdomen Pelvis W Contrast  Result Date: 12/28/2016 CLINICAL DATA:  CLL, chemotherapy in progress, diarrhea. EXAM: CT ABDOMEN AND PELVIS WITH CONTRAST TECHNIQUE: Multidetector CT imaging of the abdomen and pelvis was performed using the standard protocol following bolus administration of intravenous contrast. CONTRAST:  100 cc Isovue-300. COMPARISON:  09/15/2016. FINDINGS: Lower chest: 5 mm right middle lobe nodule, unchanged. Heart is enlarged. No pericardial or pleural effusion. Distal esophagus is grossly unremarkable. Hepatobiliary: Liver is unremarkable. Cholecystectomy. No biliary ductal dilatation. Pancreas: Negative. Spleen: Spleen measures approximately 19.0 x 7.7 x 15.3 cm (1,119 cc). Adrenals/Urinary Tract: Adrenal glands are unremarkable. Subcentimeter low-attenuation lesion in the right kidney is too small to characterize but a cyst is most likely. Ureters are decompressed. Bladder is grossly unremarkable.  Stomach/Bowel: Stomach, small bowel, appendix and colon are unremarkable. Vascular/Lymphatic: Atherosclerotic calcification of the arterial vasculature without abdominal aortic aneurysm. Left common iliac artery stent is in place. There are scattered lymph nodes throughout the abdomen, mainly within the abdominal peritoneal ligaments and retroperitoneum and most of which are subcentimeter in size. There is a 1.7 cm left periaortic lymph node, unchanged. Reproductive: Prostate is visualized. Other: No free fluid.  Mesenteries and peritoneum are unremarkable. Musculoskeletal: No worrisome lytic or sclerotic lesions. Degenerative changes are seen in the spine. IMPRESSION: 1. Borderline enlarged abdominal peritoneal ligament and abdominal retroperitoneal lymph nodes, stable. 2. Splenomegaly. 3.  Aortic atherosclerosis (ICD10-170.0). Electronically Signed   By: Lorin Picket M.D.   On: 12/28/2016 15:41    ASSESSMENT & PLAN:  CLL (chronic lymphocytic leukemia) He tolerated treatment well except for significant fatigue, pancytopenia and others I have reviewed imaging study with the patient which show partial response to treatment The failure of regression of the splenomegaly could be related to possible undiagnosed liver cirrhosis His CBC showed lymphopenia, indicated hematology good response I will reduce the dose of bendamustine further  He is reminded to take acyclovir for antimicrobial prophylaxis  Pancytopenia, acquired ([place]) He has persistent pancytopenia I recommend further dose adjustment  Hypogammaglobulinemia, 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  All questions  were answered. The patient knows to call the clinic with any problems, questions or concerns. No barriers to learning was detected. I spent 25 minutes counseling the patient face to face. The total time spent in the appointment was 40 minutes and more than 50% was on counseling and review of test results     [name], MD 12/29/2016 12:22 PM

## 2016-12-29 NOTE — Assessment & Plan Note (Signed)
He continues to battle with recurrent sinus infection The patient is known to have acquired hypogammaglobulinemia I plan to give him a dose of antibiotic treatment given his non-resolving upper respiratory tract infection along with steroid therapy

## 2016-12-29 NOTE — Assessment & Plan Note (Signed)
He has chronic congestive splenomegaly I see appearance suggestive of possible early stage liver cirrhosis It could be the cause of his chronic thrombocytopenia For now, I recommend observation only

## 2016-12-29 NOTE — Assessment & Plan Note (Signed)
He has persistent pancytopenia I recommend further dose adjustment

## 2016-12-29 NOTE — Patient Instructions (Signed)

## 2016-12-29 NOTE — Assessment & Plan Note (Signed)
He tolerated treatment well except for significant fatigue, pancytopenia and others I have reviewed imaging study with the patient which show partial response to treatment The failure of regression of the splenomegaly could be related to possible undiagnosed liver cirrhosis His CBC showed lymphopenia, indicated hematology good response I will reduce the dose of bendamustine further  He is reminded to take acyclovir for antimicrobial prophylaxis

## 2016-12-29 NOTE — Patient Instructions (Signed)
[facility] Discharge Instructions for Patients Receiving Chemotherapy  Today you received the following chemotherapy agents:  Rituxan and Bendeka.  To help prevent nausea and vomiting after your treatment, we encourage you to take your nausea medication as directed.   If you develop nausea and vomiting that is not controlled by your nausea medication, call the clinic.   BELOW ARE SYMPTOMS THAT SHOULD BE REPORTED IMMEDIATELY:  *FEVER GREATER THAN 100.5 F  *CHILLS WITH OR WITHOUT FEVER  NAUSEA AND VOMITING THAT IS NOT CONTROLLED WITH YOUR NAUSEA MEDICATION  *UNUSUAL SHORTNESS OF BREATH  *UNUSUAL BRUISING OR BLEEDING  TENDERNESS IN MOUTH AND THROAT WITH OR WITHOUT PRESENCE OF ULCERS  *URINARY PROBLEMS  *BOWEL PROBLEMS  UNUSUAL RASH Items with * indicate a potential emergency and should be followed up as soon as possible.  Feel free to call the clinic should you have any questions or concerns. The clinic phone number is [phone].  Please show the CHEMO ALERT CARD at check-in to the Emergency Department and triage nurse.   

## 2016-12-30 ENCOUNTER — Ambulatory Visit (HOSPITAL_BASED_OUTPATIENT_CLINIC_OR_DEPARTMENT_OTHER): Payer: 59

## 2016-12-30 VITALS — BP 139/82 | HR 63 | Temp 98.0°F | Resp 17

## 2016-12-30 DIAGNOSIS — C9190 Lymphoid leukemia, unspecified not having achieved remission: Secondary | ICD-10-CM

## 2016-12-30 DIAGNOSIS — C9110 Chronic lymphocytic leukemia of B-cell type not having achieved remission: Secondary | ICD-10-CM

## 2016-12-30 DIAGNOSIS — Z5111 Encounter for antineoplastic chemotherapy: Secondary | ICD-10-CM | POA: Diagnosis not present

## 2016-12-30 MED ORDER — SODIUM CHLORIDE 0.9 % IV SOLN
54.0000 mg/m2 | Freq: Once | INTRAVENOUS | Status: AC
  Administered 2016-12-30: 125 mg via INTRAVENOUS
  Filled 2016-12-30: qty 5

## 2016-12-30 MED ORDER — SODIUM CHLORIDE 0.9% FLUSH
10.0000 mL | INTRAVENOUS | Status: DC | PRN
  Administered 2016-12-30: 10 mL
  Filled 2016-12-30: qty 10

## 2016-12-30 MED ORDER — DEXAMETHASONE SODIUM PHOSPHATE 10 MG/ML IJ SOLN
INTRAMUSCULAR | Status: AC
  Filled 2016-12-30: qty 1

## 2016-12-30 MED ORDER — SODIUM CHLORIDE 0.9 % IV SOLN
Freq: Once | INTRAVENOUS | Status: AC
  Administered 2016-12-30: 09:00:00 via INTRAVENOUS

## 2016-12-30 MED ORDER — HEPARIN SOD (PORK) LOCK FLUSH 100 UNIT/ML IV SOLN
500.0000 [IU] | Freq: Once | INTRAVENOUS | Status: AC | PRN
  Administered 2016-12-30: 500 [IU]
  Filled 2016-12-30: qty 5

## 2016-12-30 MED ORDER — DEXAMETHASONE SODIUM PHOSPHATE 10 MG/ML IJ SOLN
10.0000 mg | Freq: Once | INTRAMUSCULAR | Status: AC
  Administered 2016-12-30: 10 mg via INTRAVENOUS

## 2016-12-30 NOTE — Patient Instructions (Signed)
[facility] Discharge Instructions for Patients Receiving Chemotherapy  Today you received the following chemotherapy agents Bendeka.   To help prevent nausea and vomiting after your treatment, we encourage you to take your nausea medication as prescribed.    If you develop nausea and vomiting that is not controlled by your nausea medication, call the clinic.   BELOW ARE SYMPTOMS THAT SHOULD BE REPORTED IMMEDIATELY:  *FEVER GREATER THAN 100.5 F  *CHILLS WITH OR WITHOUT FEVER  NAUSEA AND VOMITING THAT IS NOT CONTROLLED WITH YOUR NAUSEA MEDICATION  *UNUSUAL SHORTNESS OF BREATH  *UNUSUAL BRUISING OR BLEEDING  TENDERNESS IN MOUTH AND THROAT WITH OR WITHOUT PRESENCE OF ULCERS  *URINARY PROBLEMS  *BOWEL PROBLEMS  UNUSUAL RASH Items with * indicate a potential emergency and should be followed up as soon as possible.  Feel free to call the clinic should you have any questions or concerns. The clinic phone number is [phone].  Please show the CHEMO ALERT CARD at check-in to the Emergency Department and triage nurse.  

## 2017-01-06 ENCOUNTER — Telehealth: Payer: Self-pay | Admitting: *Deleted

## 2017-01-06 NOTE — Telephone Encounter (Signed)
Ryan Copeland states he went to pick up Acyclovir refill this week. Was ordered as 400 mg, 1 tablet daily, # 90 tablets. States he has been taking 400 mg, 2 tablets daily. Wants to know if Dr Alvy Bimler intended to decrease the dose. Please call Monday to let him know instructions

## 2017-01-09 ENCOUNTER — Other Ambulatory Visit: Payer: Self-pay

## 2017-01-09 DIAGNOSIS — C9110 Chronic lymphocytic leukemia of B-cell type not having achieved remission: Secondary | ICD-10-CM

## 2017-01-09 MED ORDER — ACYCLOVIR 400 MG PO TABS: 400.0000 mg | ORAL_TABLET | Freq: Two times a day (BID) | ORAL | 0 refills | Status: DC

## 2017-01-09 NOTE — Telephone Encounter (Signed)
Called with below message, verbalized understanding. Requesting another Rx be sent to pharmacy that states bid for 90 days. States that he did not pick up the first Rx.

## 2017-01-09 NOTE — Telephone Encounter (Signed)
pls call him  400 mg BID is OK for now After chemo is finished, we can switch to 400 daily

## 2017-01-19 ENCOUNTER — Other Ambulatory Visit: Payer: Self-pay | Admitting: Nurse Practitioner

## 2017-01-26 ENCOUNTER — Other Ambulatory Visit (HOSPITAL_BASED_OUTPATIENT_CLINIC_OR_DEPARTMENT_OTHER): Payer: 59

## 2017-01-26 ENCOUNTER — Telehealth: Payer: Self-pay | Admitting: Hematology and Oncology

## 2017-01-26 ENCOUNTER — Ambulatory Visit: Payer: 59

## 2017-01-26 ENCOUNTER — Ambulatory Visit (HOSPITAL_BASED_OUTPATIENT_CLINIC_OR_DEPARTMENT_OTHER): Payer: 59 | Admitting: Hematology and Oncology

## 2017-01-26 ENCOUNTER — Ambulatory Visit (HOSPITAL_BASED_OUTPATIENT_CLINIC_OR_DEPARTMENT_OTHER): Payer: 59

## 2017-01-26 VITALS — BP 108/53 | HR 65 | Temp 97.7°F | Resp 16

## 2017-01-26 DIAGNOSIS — J328 Other chronic sinusitis: Secondary | ICD-10-CM | POA: Diagnosis not present

## 2017-01-26 DIAGNOSIS — Z5111 Encounter for antineoplastic chemotherapy: Secondary | ICD-10-CM | POA: Diagnosis not present

## 2017-01-26 DIAGNOSIS — Z5112 Encounter for antineoplastic immunotherapy: Secondary | ICD-10-CM | POA: Diagnosis not present

## 2017-01-26 DIAGNOSIS — D801 Nonfamilial hypogammaglobulinemia: Secondary | ICD-10-CM

## 2017-01-26 DIAGNOSIS — C9190 Lymphoid leukemia, unspecified not having achieved remission: Secondary | ICD-10-CM

## 2017-01-26 DIAGNOSIS — C9110 Chronic lymphocytic leukemia of B-cell type not having achieved remission: Secondary | ICD-10-CM

## 2017-01-26 DIAGNOSIS — D61818 Other pancytopenia: Secondary | ICD-10-CM | POA: Diagnosis not present

## 2017-01-26 LAB — CBC WITH DIFFERENTIAL/PLATELET
BASO%: 0.8 % (ref 0.0–2.0)
Basophils Absolute: 0 10*3/uL (ref 0.0–0.1)
EOS%: 2 % (ref 0.0–7.0)
Eosinophils Absolute: 0.1 10*3/uL (ref 0.0–0.5)
HEMATOCRIT: 39.5 % (ref 38.4–49.9)
HGB: 13.6 g/dL (ref 13.0–17.1)
LYMPH#: 0.4 10*3/uL — AB (ref 0.9–3.3)
LYMPH%: 13.1 % — ABNORMAL LOW (ref 14.0–49.0)
MCH: 33.7 pg — ABNORMAL HIGH (ref 27.2–33.4)
MCHC: 34.4 g/dL (ref 32.0–36.0)
MCV: 98 fL (ref 79.3–98.0)
MONO#: 0.4 10*3/uL (ref 0.1–0.9)
MONO%: 10.9 % (ref 0.0–14.0)
NEUT#: 2.5 10*3/uL (ref 1.5–6.5)
NEUT%: 73.2 % (ref 39.0–75.0)
PLATELETS: 84 10*3/uL — AB (ref 140–400)
RBC: 4.03 10*6/uL — ABNORMAL LOW (ref 4.20–5.82)
RDW: 14.9 % — AB (ref 11.0–14.6)
WBC: 3.4 10*3/uL — ABNORMAL LOW (ref 4.0–10.3)

## 2017-01-26 LAB — COMPREHENSIVE METABOLIC PANEL
ALBUMIN: 3.6 g/dL (ref 3.5–5.0)
ALK PHOS: 78 U/L (ref 40–150)
ALT: 12 U/L (ref 0–55)
ANION GAP: 10 meq/L (ref 3–11)
AST: 13 U/L (ref 5–34)
BILIRUBIN TOTAL: 0.64 mg/dL (ref 0.20–1.20)
BUN: 9.6 mg/dL (ref 7.0–26.0)
CO2: 23 mEq/L (ref 22–29)
Calcium: 8.7 mg/dL (ref 8.4–10.4)
Chloride: 104 mEq/L (ref 98–109)
Creatinine: 1 mg/dL (ref 0.7–1.3)
GLUCOSE: 146 mg/dL — AB (ref 70–140)
POTASSIUM: 4 meq/L (ref 3.5–5.1)
SODIUM: 136 meq/L (ref 136–145)
Total Protein: 5.7 g/dL — ABNORMAL LOW (ref 6.4–8.3)

## 2017-01-26 MED ORDER — HEPARIN SOD (PORK) LOCK FLUSH 100 UNIT/ML IV SOLN
500.0000 [IU] | Freq: Once | INTRAVENOUS | Status: AC | PRN
  Administered 2017-01-26: 500 [IU]
  Filled 2017-01-26: qty 5

## 2017-01-26 MED ORDER — SODIUM CHLORIDE 0.9 % IV SOLN
Freq: Once | INTRAVENOUS | Status: AC
  Administered 2017-01-26: 11:00:00 via INTRAVENOUS

## 2017-01-26 MED ORDER — FAMOTIDINE IN NACL 20-0.9 MG/50ML-% IV SOLN
INTRAVENOUS | Status: AC
  Filled 2017-01-26: qty 50

## 2017-01-26 MED ORDER — PALONOSETRON HCL INJECTION 0.25 MG/5ML
INTRAVENOUS | Status: AC
  Filled 2017-01-26: qty 5

## 2017-01-26 MED ORDER — ACETAMINOPHEN 325 MG PO TABS
650.0000 mg | ORAL_TABLET | Freq: Once | ORAL | Status: AC
  Administered 2017-01-26: 650 mg via ORAL

## 2017-01-26 MED ORDER — DIPHENHYDRAMINE HCL 25 MG PO CAPS
ORAL_CAPSULE | ORAL | Status: AC
  Filled 2017-01-26: qty 2

## 2017-01-26 MED ORDER — PALONOSETRON HCL INJECTION 0.25 MG/5ML
0.2500 mg | Freq: Once | INTRAVENOUS | Status: AC
  Administered 2017-01-26: 0.25 mg via INTRAVENOUS

## 2017-01-26 MED ORDER — ACETAMINOPHEN 325 MG PO TABS
ORAL_TABLET | ORAL | Status: AC
Start: 2017-01-26 — End: 2017-01-26
  Filled 2017-01-26: qty 2

## 2017-01-26 MED ORDER — SODIUM CHLORIDE 0.9 % IV SOLN
54.0000 mg/m2 | Freq: Once | INTRAVENOUS | Status: AC
  Administered 2017-01-26: 125 mg via INTRAVENOUS
  Filled 2017-01-26: qty 5

## 2017-01-26 MED ORDER — DEXAMETHASONE SODIUM PHOSPHATE 10 MG/ML IJ SOLN
10.0000 mg | Freq: Once | INTRAMUSCULAR | Status: AC
  Administered 2017-01-26: 10 mg via INTRAVENOUS

## 2017-01-26 MED ORDER — SODIUM CHLORIDE 0.9% FLUSH
10.0000 mL | Freq: Once | INTRAVENOUS | Status: AC
Start: 2017-01-26 — End: 2017-01-26
  Administered 2017-01-26: 10 mL
  Filled 2017-01-26: qty 10

## 2017-01-26 MED ORDER — FAMOTIDINE IN NACL 20-0.9 MG/50ML-% IV SOLN
20.0000 mg | Freq: Once | INTRAVENOUS | Status: AC
  Administered 2017-01-26: 20 mg via INTRAVENOUS

## 2017-01-26 MED ORDER — RITUXIMAB CHEMO INJECTION 500 MG/50ML
375.0000 mg/m2 | Freq: Once | INTRAVENOUS | Status: AC
  Administered 2017-01-26: 900 mg via INTRAVENOUS
  Filled 2017-01-26: qty 90

## 2017-01-26 MED ORDER — DEXAMETHASONE SODIUM PHOSPHATE 10 MG/ML IJ SOLN
INTRAMUSCULAR | Status: AC
  Filled 2017-01-26: qty 1

## 2017-01-26 MED ORDER — SODIUM CHLORIDE 0.9% FLUSH
10.0000 mL | INTRAVENOUS | Status: DC | PRN
  Administered 2017-01-26: 10 mL
  Filled 2017-01-26: qty 10

## 2017-01-26 MED ORDER — DIPHENHYDRAMINE HCL 25 MG PO CAPS
50.0000 mg | ORAL_CAPSULE | Freq: Once | ORAL | Status: AC
  Administered 2017-01-26: 50 mg via ORAL

## 2017-01-26 NOTE — Patient Instructions (Addendum)
[facility] Discharge Instructions for Patients Receiving Chemotherapy  Today you received the following chemotherapy agents rituximab (Rituxan) and bendamustine Verl Dicker)  To help prevent nausea and vomiting after your treatment, we encourage you to take your nausea medication as directed.   If you develop nausea and vomiting that is not controlled by your nausea medication, call the clinic.   BELOW ARE SYMPTOMS THAT SHOULD BE REPORTED IMMEDIATELY:  *FEVER GREATER THAN 100.5 F  *CHILLS WITH OR WITHOUT FEVER  NAUSEA AND VOMITING THAT IS NOT CONTROLLED WITH YOUR NAUSEA MEDICATION  *UNUSUAL SHORTNESS OF BREATH  *UNUSUAL BRUISING OR BLEEDING  TENDERNESS IN MOUTH AND THROAT WITH OR WITHOUT PRESENCE OF ULCERS  *URINARY PROBLEMS  *BOWEL PROBLEMS  UNUSUAL RASH Items with * indicate a potential emergency and should be followed up as soon as possible.  Feel free to call the clinic should you have any questions or concerns. The clinic phone number is [phone].  Please show the [place] at check-in to the Emergency Department and triage nurse.

## 2017-01-26 NOTE — Telephone Encounter (Signed)
Gave avs and calendar for January 2019 °

## 2017-01-27 ENCOUNTER — Other Ambulatory Visit: Payer: Self-pay | Admitting: *Deleted

## 2017-01-27 ENCOUNTER — Ambulatory Visit (HOSPITAL_BASED_OUTPATIENT_CLINIC_OR_DEPARTMENT_OTHER): Payer: 59

## 2017-01-27 ENCOUNTER — Encounter: Payer: Self-pay | Admitting: Hematology and Oncology

## 2017-01-27 VITALS — BP 139/62 | HR 60 | Temp 97.8°F | Resp 17

## 2017-01-27 DIAGNOSIS — C9190 Lymphoid leukemia, unspecified not having achieved remission: Secondary | ICD-10-CM

## 2017-01-27 DIAGNOSIS — J329 Chronic sinusitis, unspecified: Secondary | ICD-10-CM | POA: Insufficient documentation

## 2017-01-27 DIAGNOSIS — Z5111 Encounter for antineoplastic chemotherapy: Secondary | ICD-10-CM

## 2017-01-27 DIAGNOSIS — C9110 Chronic lymphocytic leukemia of B-cell type not having achieved remission: Secondary | ICD-10-CM

## 2017-01-27 MED ORDER — SODIUM CHLORIDE 0.9 % IV SOLN
54.0000 mg/m2 | Freq: Once | INTRAVENOUS | Status: AC
  Administered 2017-01-27: 125 mg via INTRAVENOUS
  Filled 2017-01-27: qty 5

## 2017-01-27 MED ORDER — ALBUTEROL SULFATE HFA 108 (90 BASE) MCG/ACT IN AERS: 2.0000 | INHALATION_SPRAY | Freq: Four times a day (QID) | RESPIRATORY_TRACT | 2 refills | Status: DC | PRN

## 2017-01-27 MED ORDER — SODIUM CHLORIDE 0.9 % IV SOLN
Freq: Once | INTRAVENOUS | Status: AC
  Administered 2017-01-27: 09:00:00 via INTRAVENOUS

## 2017-01-27 MED ORDER — DEXAMETHASONE SODIUM PHOSPHATE 10 MG/ML IJ SOLN
10.0000 mg | Freq: Once | INTRAMUSCULAR | Status: AC
  Administered 2017-01-27: 10 mg via INTRAVENOUS

## 2017-01-27 MED ORDER — HEPARIN SOD (PORK) LOCK FLUSH 100 UNIT/ML IV SOLN
500.0000 [IU] | Freq: Once | INTRAVENOUS | Status: AC | PRN
  Administered 2017-01-27: 500 [IU]
  Filled 2017-01-27: qty 5

## 2017-01-27 MED ORDER — DEXAMETHASONE SODIUM PHOSPHATE 10 MG/ML IJ SOLN
INTRAMUSCULAR | Status: AC
  Filled 2017-01-27: qty 1

## 2017-01-27 MED ORDER — SODIUM CHLORIDE 0.9% FLUSH
10.0000 mL | INTRAVENOUS | Status: DC | PRN
  Administered 2017-01-27: 10 mL
  Filled 2017-01-27: qty 10

## 2017-01-27 NOTE — Progress Notes (Signed)
[facility] OFFICE PROGRESS NOTE  Patient Care Team: [name]., PA-C as PCP - General (Family Medicine) Burtis Junes, NP as Nurse Practitioner (Cardiology) Carol Ada, MD as Consulting Physician (Gastroenterology)  SUMMARY OF ONCOLOGIC HISTORY:   CLL (chronic lymphocytic leukemia) ([place])   06/28/2009 Imaging    1.  No evidence of aortic dissection or other acute process in the chest. 2.  Centrilobular emphysema with a 5 mm right lung nodule. Given the concurrent centrilobular emphysema, follow-up chest CT at 6 -12 months is recommended.  3.  Coronary artery atherosclerosis which is age advanced. 4.  Prominent thoracic lymph nodes.  These can be reevaluated at follow-up.      05/06/2010 Imaging    1.  Multiple small periaortic lymph nodes consistent with the patient's history of the chronic lymphocytic leukemia. 2.  No evidence of solid organ involvement      11/03/2011 Imaging    1.  Interval progression of abdominal and pelvic adenopathy. 2.  Progression of splenomegaly.  The spleen now measures 23 cm in length      11/18/2011 Bone Marrow Biopsy    Bone Marrow, Aspirate,Biopsy, and Clot, right iliac bone - HYPERCELLULAR BONE MARROW WITH EXTENSIVE INVOLVEMENT BY CHRONIC LYMPHOCYTIC LEUKEMIA. PERIPHERAL BLOOD: - CHRONIC LYMPHOCYTIC LEUKEMIA      03/08/2012 Imaging    1.  Progressive increase in retroperitoneal, iliac, and inguinal lymphadenopathy. 2.  Interval increase in massive splenomegaly.       06/01/2012 Procedure    Placement of single lumen port a cath via right internal jugular vein.  The catheter tip lies at the cavoatrial junction.  A power injectable port a cath was placed and is ready for immediate use      06/20/2012 - 11/23/2012 Chemotherapy    He received FCR x 6 cycles      12/19/2012 Imaging    Left common iliac stent. Abdominal vasculature remains patent. Improving supraclavicular and axillary lymphadenopathy. Residual right  subpectoral nodes measure up to 10 mm short axis. Improving retroperitoneal lymphadenopathy, measuring up to 16 mm short axis. Improving splenomegaly, measuring 18.7 cm.       01/20/2016 Imaging    1. Stable exam.  No new or progressive findings. 2. No CT findings to explain odynophagia      09/02/2016 Pathology Results    The findings are consistent with involvement by previously known chronic lymphocytic leukemia      09/02/2016 Pathology Results    FISH for CLL came back positive for deletion 13q      09/15/2016 Imaging    1. Borderline enlarged abdominal and pelvic lymph nodes. Compared with 11/03/2011 these are decreased in size as detailed above. 2. Persistent splenomegaly. 3. Aortic Atherosclerosis (ICD10-I70.0). LAD coronary artery calcification noted.      10/06/2016 -  Chemotherapy    He received Bendamustine and Rituxan      11/03/2016 Adverse Reaction    Dose of Bendamustine is reduced due to severe pancytopenia      12/28/2016 Imaging    1. Borderline enlarged abdominal peritoneal ligament and abdominal retroperitoneal lymph nodes, stable. 2. Splenomegaly. 3.  Aortic atherosclerosis (ICD10-170.0).       INTERVAL HISTORY: Please see below for problem oriented charting. He returns for further follow-up He continues to have chronic cough and sinus congestion Denies recent fever or chills The patient denies any recent signs or symptoms of bleeding such as spontaneous epistaxis, hematuria or hematochezia. No recent nausea, vomiting or diarrhea with therapy  REVIEW OF SYSTEMS:   Constitutional: Denies fevers, chills or abnormal weight loss Eyes: Denies blurriness of vision Ears, nose, mouth, throat, and face: Denies mucositis or sore throat Cardiovascular: Denies palpitation, chest discomfort or lower extremity swelling Gastrointestinal:  Denies nausea, heartburn or change in bowel habits Skin: Denies abnormal skin rashes Lymphatics: Denies new lymphadenopathy  or easy bruising Neurological:Denies numbness, tingling or new weaknesses Behavioral/Psych: Mood is stable, no new changes  All other systems were reviewed with the patient and are negative.  I have reviewed the past medical history, past surgical history, social history and family history with the patient and they are unchanged from previous note.  ALLERGIES:  is allergic to codeine.  MEDICATIONS:  Current Outpatient Medications  Medication Sig Dispense Refill  . acetaminophen (TYLENOL) 500 MG tablet Take 500 mg by mouth every 6 (six) hours as needed for mild pain, moderate pain or headache.    Marland Kitchen acyclovir (ZOVIRAX) 400 MG tablet Take 1 tablet (400 mg total) by mouth 2 (two) times daily. 180 tablet 0  . albuterol (PROVENTIL HFA;VENTOLIN HFA) 108 (90 Base) MCG/ACT inhaler Inhale 2 puffs into the lungs every 6 (six) hours as needed for wheezing or shortness of breath. 1 Inhaler 2  . ALPRAZolam (XANAX) 0.25 MG tablet Take 0.25 mg by mouth as needed for anxiety.    Marland Kitchen aspirin EC 81 MG tablet Take 1 tablet (81 mg total) by mouth daily.    . cyanocobalamin (,VITAMIN B-12,) 1000 MCG/ML injection Inject 1 mL (1,000 mcg total) into the muscle every 30 (thirty) days. 10 mL 1  . esomeprazole (NEXIUM) 40 MG capsule Take 40 mg by mouth 2 (two) times daily.  0  . fluticasone (FLONASE) 50 MCG/ACT nasal spray Place into the nose.    . gabapentin (NEURONTIN) 300 MG capsule Take 1 capsule (300 mg total) by mouth 2 (two) times daily. 180 capsule 1  . HYDROcodone-acetaminophen (NORCO) 10-325 MG tablet take 1 tablet by mouth every 6 to 8 hours if needed for pain  0  . lidocaine (LIDODERM) 5 % apply patch to affected area once daily as directed for lower back pain  0  . lidocaine-prilocaine (EMLA) cream Apply to affected area once 30 g 3  . meclizine (ANTIVERT) 12.5 MG tablet Take 12.5 mg by mouth every 6 (six) hours as needed for dizziness.   0  . metFORMIN (GLUCOPHAGE-XR) 500 MG 24 hr tablet Take by mouth.  Takes one tablet in AM and 2 tablets at night.    . metoprolol succinate (TOPROL XL) 25 MG 24 hr tablet Take 1 tablet (25 mg total) by mouth daily. 90 tablet 3  . nitroGLYCERIN (NITROSTAT) 0.4 MG SL tablet PLACE 1 TABLET UNDER THE TONGUE EVERY 5 MINUTES X 3 DOSES AS NEEDED FOR CHEST PAIN *MAX 3 DOSES* 25 tablet 5  . ondansetron (ZOFRAN) 8 MG tablet Take 1 tablet (8 mg total) by mouth 2 (two) times daily as needed for refractory nausea / vomiting. Start on day 2 after bendamustine 30 tablet 1  . pregabalin (LYRICA) 100 MG capsule 1 cap three times daily and 2 caps at hs 450 capsule 1  . prochlorperazine (COMPAZINE) 10 MG tablet Take 1 tablet (10 mg total) by mouth every 6 (six) hours as needed (Nausea or vomiting). 30 tablet 1  . rosuvastatin (CRESTOR) 5 MG tablet Take 1 tablet (5 mg total) by mouth daily. 90 tablet 3  . sildenafil (VIAGRA) 100 MG tablet Take 0.5-1 tablets (50-100 mg total) by mouth  daily as needed for erectile dysfunction (30 minutes prior to sexual intercourse). 10 tablet 6  . tetrahydrozoline 0.05 % ophthalmic solution Place 1 drop into both eyes as needed. Red eyes    . zolpidem (AMBIEN) 10 MG tablet Take 10 mg by mouth at bedtime as needed for sleep.   0   No current facility-administered medications for this visit.    Facility-Administered Medications Ordered in Other Visits  Medication Dose Route Frequency Provider Last Rate Last Dose  . 0.9 %  sodium chloride infusion   Intravenous Continuous Alvy Bimler, Merrit Friesen, MD 50 mL/hr at 03/07/14 1005    . sodium chloride 0.9 % injection 10 mL  10 mL Intracatheter PRN Marcy Panning, MD   10 mL at 08/22/12 1721    PHYSICAL EXAMINATION: ECOG PERFORMANCE STATUS: 1 - Symptomatic but completely ambulatory  Vitals:   01/26/17 1001  BP: (!) 132/57  Pulse: 63  Resp: 18  Temp: 98.2 F (36.8 C)  SpO2: 98%   Filed Weights   01/26/17 1001  Weight: 222 lb 6.4 oz (100.9 kg)    GENERAL:alert, no distress and comfortable SKIN: skin color,  texture, turgor are normal, no rashes or significant lesions EYES: normal, Conjunctiva are pink and non-injected, sclera clear OROPHARYNX:no exudate, no erythema and lips, buccal mucosa, and tongue normal  NECK: supple, thyroid normal size, non-tender, without nodularity LYMPH:  no palpable lymphadenopathy in the cervical, axillary or inguinal LUNGS: clear to auscultation and percussion with normal breathing effort HEART: regular rate & rhythm and no murmurs and no lower extremity edema ABDOMEN:abdomen soft, non-tender and normal bowel sounds Musculoskeletal:no cyanosis of digits and no clubbing  NEURO: alert & oriented x 3 with fluent speech, no focal motor/sensory deficits  LABORATORY DATA:  I have reviewed the data as listed    Component Value Date/Time   NA 136 01/26/2017 0940   K 4.0 01/26/2017 0940   CL 101 05/24/2016 0848   CL 104 08/03/2012 1229   CO2 23 01/26/2017 0940   GLUCOSE 146 (H) 01/26/2017 0940   GLUCOSE 223 (H) 08/03/2012 1229   BUN 9.6 01/26/2017 0940   CREATININE 1.0 01/26/2017 0940   CALCIUM 8.7 01/26/2017 0940   PROT 5.7 (L) 01/26/2017 0940   ALBUMIN 3.6 01/26/2017 0940   AST 13 01/26/2017 0940   ALT 12 01/26/2017 0940   ALKPHOS 78 01/26/2017 0940   BILITOT 0.64 01/26/2017 0940   GFRNONAA 82 05/24/2016 0848   GFRAA 94 05/24/2016 0848    No results found for: SPEP, UPEP  Lab Results  Component Value Date   WBC 3.4 (L) 01/26/2017   NEUTROABS 2.5 01/26/2017   HGB 13.6 01/26/2017   HCT 39.5 01/26/2017   MCV 98.0 01/26/2017   PLT 84 (L) 01/26/2017      Chemistry      Component Value Date/Time   NA 136 01/26/2017 0940   K 4.0 01/26/2017 0940   CL 101 05/24/2016 0848   CL 104 08/03/2012 1229   CO2 23 01/26/2017 0940   BUN 9.6 01/26/2017 0940   CREATININE 1.0 01/26/2017 0940      Component Value Date/Time   CALCIUM 8.7 01/26/2017 0940   ALKPHOS 78 01/26/2017 0940   AST 13 01/26/2017 0940   ALT 12 01/26/2017 0940   BILITOT 0.64 01/26/2017  0940       ASSESSMENT & PLAN:  CLL (chronic lymphocytic leukemia) He tolerated treatment well except for significant fatigue, pancytopenia and others He will continue on reduced dose of bendamustine  He is reminded to take acyclovir for antimicrobial prophylaxis  Pancytopenia, acquired ([place]) He has persistent pancytopenia due to side effects of treatment and chronic splenomegaly We will proceed with treatment with dose adjustment  Chronic sinusitis He has chronic sinus drainage and cough I recommend conservative management with over-the-counter decongestion and inhaler as needed He does not need antibiotic therapy   No orders of the defined types were placed in this encounter.  All questions were answered. The patient knows to call the clinic with any problems, questions or concerns. No barriers to learning was detected. I spent 15 minutes counseling the patient face to face. The total time spent in the appointment was 20 minutes and more than 50% was on counseling and review of test results     Ryan Lark, MD 01/27/2017 3:02 PM

## 2017-01-27 NOTE — Assessment & Plan Note (Signed)
He tolerated treatment well except for significant fatigue, pancytopenia and others He will continue on reduced dose of bendamustine  He is reminded to take acyclovir for antimicrobial prophylaxis

## 2017-01-27 NOTE — Assessment & Plan Note (Signed)
He has chronic sinus drainage and cough I recommend conservative management with over-the-counter decongestion and inhaler as needed He does not need antibiotic therapy

## 2017-01-27 NOTE — Progress Notes (Signed)
Using treatment parameters from 12/13 to treat despite abnormal labs for today's bendeka infusion.

## 2017-01-27 NOTE — Assessment & Plan Note (Signed)
He has persistent pancytopenia due to side effects of treatment and chronic splenomegaly We will proceed with treatment with dose adjustment

## 2017-01-27 NOTE — Patient Instructions (Signed)
[facility] Discharge Instructions for Patients Receiving Chemotherapy  Today you received the following chemotherapy agent: bendamustine Verl Dicker)  To help prevent nausea and vomiting after your treatment, we encourage you to take your nausea medication as directed.   If you develop nausea and vomiting that is not controlled by your nausea medication, call the clinic.   BELOW ARE SYMPTOMS THAT SHOULD BE REPORTED IMMEDIATELY:  *FEVER GREATER THAN 100.5 F  *CHILLS WITH OR WITHOUT FEVER  NAUSEA AND VOMITING THAT IS NOT CONTROLLED WITH YOUR NAUSEA MEDICATION  *UNUSUAL SHORTNESS OF BREATH  *UNUSUAL BRUISING OR BLEEDING  TENDERNESS IN MOUTH AND THROAT WITH OR WITHOUT PRESENCE OF ULCERS  *URINARY PROBLEMS  *BOWEL PROBLEMS  UNUSUAL RASH Items with * indicate a potential emergency and should be followed up as soon as possible.  Feel free to call the clinic should you have any questions or concerns. The clinic phone number is [phone].  Please show the [facility] at check-in to the Emergency Department and triage nurse.

## 2017-02-17 ENCOUNTER — Ambulatory Visit: Payer: BLUE CROSS/BLUE SHIELD | Admitting: Vascular Surgery

## 2017-02-17 ENCOUNTER — Encounter (HOSPITAL_COMMUNITY): Payer: BLUE CROSS/BLUE SHIELD

## 2017-02-23 ENCOUNTER — Encounter: Payer: Self-pay | Admitting: Hematology and Oncology

## 2017-02-23 ENCOUNTER — Telehealth: Payer: Self-pay | Admitting: Hematology and Oncology

## 2017-02-23 ENCOUNTER — Inpatient Hospital Stay: Payer: BLUE CROSS/BLUE SHIELD

## 2017-02-23 ENCOUNTER — Inpatient Hospital Stay: Payer: BLUE CROSS/BLUE SHIELD | Attending: Hematology and Oncology | Admitting: Hematology and Oncology

## 2017-02-23 VITALS — BP 144/74 | HR 70 | Temp 97.8°F | Resp 18 | Ht 72.0 in | Wt 213.0 lb

## 2017-02-23 VITALS — BP 111/74 | HR 63 | Temp 98.4°F | Resp 16

## 2017-02-23 DIAGNOSIS — D61818 Other pancytopenia: Secondary | ICD-10-CM | POA: Diagnosis not present

## 2017-02-23 DIAGNOSIS — C9110 Chronic lymphocytic leukemia of B-cell type not having achieved remission: Secondary | ICD-10-CM | POA: Insufficient documentation

## 2017-02-23 DIAGNOSIS — Z5111 Encounter for antineoplastic chemotherapy: Secondary | ICD-10-CM | POA: Insufficient documentation

## 2017-02-23 DIAGNOSIS — D801 Nonfamilial hypogammaglobulinemia: Secondary | ICD-10-CM

## 2017-02-23 DIAGNOSIS — Z5112 Encounter for antineoplastic immunotherapy: Secondary | ICD-10-CM | POA: Diagnosis not present

## 2017-02-23 DIAGNOSIS — D696 Thrombocytopenia, unspecified: Secondary | ICD-10-CM | POA: Insufficient documentation

## 2017-02-23 DIAGNOSIS — J449 Chronic obstructive pulmonary disease, unspecified: Secondary | ICD-10-CM | POA: Insufficient documentation

## 2017-02-23 DIAGNOSIS — R161 Splenomegaly, not elsewhere classified: Secondary | ICD-10-CM | POA: Diagnosis not present

## 2017-02-23 DIAGNOSIS — R05 Cough: Secondary | ICD-10-CM | POA: Insufficient documentation

## 2017-02-23 LAB — CBC WITH DIFFERENTIAL/PLATELET
BASOS PCT: 0 %
Basophils Absolute: 0 10*3/uL (ref 0.0–0.1)
EOS ABS: 0 10*3/uL (ref 0.0–0.5)
EOS PCT: 0 %
HCT: 44.2 % (ref 38.4–49.9)
Hemoglobin: 13.7 g/dL (ref 13.0–17.1)
LYMPHS ABS: 0.9 10*3/uL (ref 0.9–3.3)
Lymphocytes Relative: 14 %
MCH: 34.3 pg — AB (ref 27.2–33.4)
MCHC: 31 g/dL — ABNORMAL LOW (ref 32.0–36.0)
MCV: 110.5 fL — ABNORMAL HIGH (ref 79.3–98.0)
MONOS PCT: 3 %
Monocytes Absolute: 0.2 10*3/uL (ref 0.1–0.9)
Neutro Abs: 5.1 10*3/uL (ref 1.5–6.5)
Neutrophils Relative %: 83 %
PLATELETS: 71 10*3/uL — AB (ref 140–400)
RBC: 4 MIL/uL — ABNORMAL LOW (ref 4.20–5.82)
RDW: 15.9 % — AB (ref 11.0–15.6)
WBC: 6.2 10*3/uL (ref 4.0–10.3)

## 2017-02-23 LAB — COMPREHENSIVE METABOLIC PANEL
ALBUMIN: 3.5 g/dL (ref 3.5–5.0)
ALK PHOS: 81 U/L (ref 40–150)
ALT: 8 U/L (ref 0–55)
ANION GAP: 8 (ref 3–11)
AST: 9 U/L (ref 5–34)
BUN: 8 mg/dL (ref 7–26)
CALCIUM: 9.2 mg/dL (ref 8.4–10.4)
CO2: 24 mmol/L (ref 22–29)
Chloride: 105 mmol/L (ref 98–109)
Creatinine, Ser: 0.89 mg/dL (ref 0.70–1.30)
GFR calc non Af Amer: >60 mL/min (ref >60)
GLUCOSE: 122 mg/dL (ref 70–140)
POTASSIUM: 4 mmol/L (ref 3.5–5.1)
SODIUM: 137 mmol/L (ref 136–145)
Total Bilirubin: 0.5 mg/dL (ref 0.2–1.2)
Total Protein: 6.2 g/dL — ABNORMAL LOW (ref 6.4–8.3)

## 2017-02-23 MED ORDER — PALONOSETRON HCL INJECTION 0.25 MG/5ML
0.2500 mg | Freq: Once | INTRAVENOUS | Status: AC
  Administered 2017-02-23: 0.25 mg via INTRAVENOUS

## 2017-02-23 MED ORDER — PREDNISONE 20 MG PO TABS: 20.0000 mg | ORAL_TABLET | Freq: Every day | ORAL | 1 refills | Status: DC

## 2017-02-23 MED ORDER — PALONOSETRON HCL INJECTION 0.25 MG/5ML
INTRAVENOUS | Status: AC
  Filled 2017-02-23: qty 5

## 2017-02-23 MED ORDER — SODIUM CHLORIDE 0.9% FLUSH
10.0000 mL | INTRAVENOUS | Status: DC | PRN
  Administered 2017-02-23: 10 mL
  Filled 2017-02-23: qty 10

## 2017-02-23 MED ORDER — ACETAMINOPHEN 325 MG PO TABS
650.0000 mg | ORAL_TABLET | Freq: Once | ORAL | Status: AC
  Administered 2017-02-23: 650 mg via ORAL

## 2017-02-23 MED ORDER — DEXAMETHASONE SODIUM PHOSPHATE 10 MG/ML IJ SOLN
10.0000 mg | Freq: Once | INTRAMUSCULAR | Status: AC
  Administered 2017-02-23: 10 mg via INTRAVENOUS

## 2017-02-23 MED ORDER — SODIUM CHLORIDE 0.9 % IV SOLN
Freq: Once | INTRAVENOUS | Status: AC
  Administered 2017-02-23: 13:00:00 via INTRAVENOUS

## 2017-02-23 MED ORDER — BENDAMUSTINE HCL CHEMO INJECTION 100 MG/4ML
54.0000 mg/m2 | Freq: Once | INTRAVENOUS | Status: AC
  Administered 2017-02-23: 125 mg via INTRAVENOUS
  Filled 2017-02-23: qty 5

## 2017-02-23 MED ORDER — ACETAMINOPHEN 325 MG PO TABS
ORAL_TABLET | ORAL | Status: AC
  Filled 2017-02-23: qty 2

## 2017-02-23 MED ORDER — FAMOTIDINE IN NACL 20-0.9 MG/50ML-% IV SOLN
INTRAVENOUS | Status: AC
  Filled 2017-02-23: qty 50

## 2017-02-23 MED ORDER — DIPHENHYDRAMINE HCL 25 MG PO CAPS
50.0000 mg | ORAL_CAPSULE | Freq: Once | ORAL | Status: AC
  Administered 2017-02-23: 50 mg via ORAL

## 2017-02-23 MED ORDER — HEPARIN SOD (PORK) LOCK FLUSH 100 UNIT/ML IV SOLN
500.0000 [IU] | Freq: Once | INTRAVENOUS | Status: AC | PRN
  Administered 2017-02-23: 500 [IU]
  Filled 2017-02-23: qty 5

## 2017-02-23 MED ORDER — DEXAMETHASONE SODIUM PHOSPHATE 10 MG/ML IJ SOLN
INTRAMUSCULAR | Status: AC
  Filled 2017-02-23: qty 1

## 2017-02-23 MED ORDER — SODIUM CHLORIDE 0.9 % IV SOLN
375.0000 mg/m2 | Freq: Once | INTRAVENOUS | Status: AC
  Administered 2017-02-23: 900 mg via INTRAVENOUS
  Filled 2017-02-23: qty 50

## 2017-02-23 MED ORDER — FAMOTIDINE IN NACL 20-0.9 MG/50ML-% IV SOLN
20.0000 mg | Freq: Once | INTRAVENOUS | Status: AC
  Administered 2017-02-23: 20 mg via INTRAVENOUS

## 2017-02-23 MED ORDER — DIPHENHYDRAMINE HCL 25 MG PO CAPS
ORAL_CAPSULE | ORAL | Status: AC
  Filled 2017-02-23: qty 2

## 2017-02-23 MED ORDER — SODIUM CHLORIDE 0.9% FLUSH
10.0000 mL | Freq: Once | INTRAVENOUS | Status: AC
  Administered 2017-02-23: 10 mL
  Filled 2017-02-23: qty 10

## 2017-02-23 NOTE — Telephone Encounter (Signed)
Gave patient AVs and calendar of upcoming February appointments.  °

## 2017-02-23 NOTE — Patient Instructions (Signed)
Implanted Port Home Guide An implanted port is a type of central line that is placed under the skin. Central lines are used to provide IV access when treatment or nutrition needs to be given through a person's veins. Implanted ports are used for long-term IV access. An implanted port may be placed because:  You need IV medicine that would be irritating to the small veins in your hands or arms.  You need long-term IV medicines, such as antibiotics.  You need IV nutrition for a long period.  You need frequent blood draws for lab tests.  You need dialysis.  Implanted ports are usually placed in the chest area, but they can also be placed in the upper arm, the abdomen, or the leg. An implanted port has two main parts:  Reservoir. The reservoir is round and will appear as a small, raised area under your skin. The reservoir is the part where a needle is inserted to give medicines or draw blood.  Catheter. The catheter is a thin, flexible tube that extends from the reservoir. The catheter is placed into a large vein. Medicine that is inserted into the reservoir goes into the catheter and then into the vein.  How will I care for my incision site? Do not get the incision site wet. Bathe or shower as directed by your health care provider. How is my port accessed? Special steps must be taken to access the port:  Before the port is accessed, a numbing cream can be placed on the skin. This helps numb the skin over the port site.  Your health care provider uses a sterile technique to access the port. ? Your health care provider must put on a mask and sterile gloves. ? The skin over your port is cleaned carefully with an antiseptic and allowed to dry. ? The port is gently pinched between sterile gloves, and a needle is inserted into the port.  Only "non-coring" port needles should be used to access the port. Once the port is accessed, a blood return should be checked. This helps ensure that the port  is in the vein and is not clogged.  If your port needs to remain accessed for a constant infusion, a clear (transparent) bandage will be placed over the needle site. The bandage and needle will need to be changed every week, or as directed by your health care provider.  Keep the bandage covering the needle clean and dry. Do not get it wet. Follow your health care provider's instructions on how to take a shower or bath while the port is accessed.  If your port does not need to stay accessed, no bandage is needed over the port.  What is flushing? Flushing helps keep the port from getting clogged. Follow your health care provider's instructions on how and when to flush the port. Ports are usually flushed with saline solution or a medicine called heparin. The need for flushing will depend on how the port is used.  If the port is used for intermittent medicines or blood draws, the port will need to be flushed: ? After medicines have been given. ? After blood has been drawn. ? As part of routine maintenance.  If a constant infusion is running, the port may not need to be flushed.  How long will my port stay implanted? The port can stay in for as long as your health care provider thinks it is needed. When it is time for the port to come out, surgery will be   done to remove it. The procedure is similar to the one performed when the port was put in. When should I seek immediate medical care? When you have an implanted port, you should seek immediate medical care if:  You notice a bad smell coming from the incision site.  You have swelling, redness, or drainage at the incision site.  You have more swelling or pain at the port site or the surrounding area.  You have a fever that is not controlled with medicine.  This information is not intended to replace advice given to you by your health care provider. Make sure you discuss any questions you have with your health care provider. Document  Released: 01/31/2005 Document Revised: 07/09/2015 Document Reviewed: 10/08/2012 Elsevier Interactive Patient Education  2017 [employer]  

## 2017-02-23 NOTE — Assessment & Plan Note (Signed)
The patient continues to feel ill with fatigue and recent weight loss He had recurrent cough and sinus congestion despite multiple courses of antibiotics Recent CT scan showed partial response to treatment I recommend repeat imaging study with a PET CT scan after this cycle and he agreed to proceed

## 2017-02-23 NOTE — Progress Notes (Signed)
[place] OFFICE PROGRESS NOTE  Patient Care Team: [name]., PA-C as PCP - General (Family Medicine) Burtis Junes, NP as Nurse Practitioner (Cardiology) Carol Ada, MD as Consulting Physician (Gastroenterology)  SUMMARY OF ONCOLOGIC HISTORY:   CLL (chronic lymphocytic leukemia) ([place])   06/28/2009 Imaging    1.  No evidence of aortic dissection or other acute process in the chest. 2.  Centrilobular emphysema with a 5 mm right lung nodule. Given the concurrent centrilobular emphysema, follow-up chest CT at 6 -12 months is recommended.  3.  Coronary artery atherosclerosis which is age advanced. 4.  Prominent thoracic lymph nodes.  These can be reevaluated at follow-up.      05/06/2010 Imaging    1.  Multiple small periaortic lymph nodes consistent with the patient's history of the chronic lymphocytic leukemia. 2.  No evidence of solid organ involvement      11/03/2011 Imaging    1.  Interval progression of abdominal and pelvic adenopathy. 2.  Progression of splenomegaly.  The spleen now measures 23 cm in length      11/18/2011 Bone Marrow Biopsy    Bone Marrow, Aspirate,Biopsy, and Clot, right iliac bone - HYPERCELLULAR BONE MARROW WITH EXTENSIVE INVOLVEMENT BY CHRONIC LYMPHOCYTIC LEUKEMIA. PERIPHERAL BLOOD: - CHRONIC LYMPHOCYTIC LEUKEMIA      03/08/2012 Imaging    1.  Progressive increase in retroperitoneal, iliac, and inguinal lymphadenopathy. 2.  Interval increase in massive splenomegaly.       06/01/2012 Procedure    Placement of single lumen port a cath via right internal jugular vein.  The catheter tip lies at the cavoatrial junction.  A power injectable port a cath was placed and is ready for immediate use      06/20/2012 - 11/23/2012 Chemotherapy    He received FCR x 6 cycles      12/19/2012 Imaging    Left common iliac stent. Abdominal vasculature remains patent. Improving supraclavicular and axillary lymphadenopathy. Residual right  subpectoral nodes measure up to 10 mm short axis. Improving retroperitoneal lymphadenopathy, measuring up to 16 mm short axis. Improving splenomegaly, measuring 18.7 cm.       01/20/2016 Imaging    1. Stable exam.  No new or progressive findings. 2. No CT findings to explain odynophagia      09/02/2016 Pathology Results    The findings are consistent with involvement by previously known chronic lymphocytic leukemia      09/02/2016 Pathology Results    FISH for CLL came back positive for deletion 13q      09/15/2016 Imaging    1. Borderline enlarged abdominal and pelvic lymph nodes. Compared with 11/03/2011 these are decreased in size as detailed above. 2. Persistent splenomegaly. 3. Aortic Atherosclerosis (ICD10-I70.0). LAD coronary artery calcification noted.      10/06/2016 -  Chemotherapy    He received Bendamustine and Rituxan      11/03/2016 Adverse Reaction    Dose of Bendamustine is reduced due to severe pancytopenia      12/28/2016 Imaging    1. Borderline enlarged abdominal peritoneal ligament and abdominal retroperitoneal lymph nodes, stable. 2. Splenomegaly. 3.  Aortic atherosclerosis (ICD10-170.0).       INTERVAL HISTORY: Please see below for problem oriented charting. He returns to be seen prior to cycle 6 of chemotherapy He does not feel well He denies recent smoking Despite recent antibiotic therapy, he does not feel better He have sneezing, coughing and sinus congestion He has lost some weight He denies new  lymphadenopathy.  No changes in bowel habits or nausea.  REVIEW OF SYSTEMS:   Constitutional: Denies fevers, chills or abnormal weight loss Eyes: Denies blurriness of vision Cardiovascular: Denies palpitation, chest discomfort or lower extremity swelling Gastrointestinal:  Denies nausea, heartburn or change in bowel habits Skin: Denies abnormal skin rashes Lymphatics: Denies new lymphadenopathy or easy bruising Neurological:Denies numbness,  tingling or new weaknesses Behavioral/Psych: Mood is stable, no new changes  All other systems were reviewed with the patient and are negative.  I have reviewed the past medical history, past surgical history, social history and family history with the patient and they are unchanged from previous note.  ALLERGIES:  is allergic to codeine.  MEDICATIONS:  Current Outpatient Medications  Medication Sig Dispense Refill  . acetaminophen (TYLENOL) 500 MG tablet Take 500 mg by mouth every 6 (six) hours as needed for mild pain, moderate pain or headache.    Marland Kitchen acyclovir (ZOVIRAX) 400 MG tablet Take 1 tablet (400 mg total) by mouth 2 (two) times daily. 180 tablet 0  . albuterol (PROVENTIL HFA;VENTOLIN HFA) 108 (90 Base) MCG/ACT inhaler Inhale 2 puffs into the lungs every 6 (six) hours as needed for wheezing or shortness of breath. 1 Inhaler 2  . ALPRAZolam (XANAX) 0.25 MG tablet Take 0.25 mg by mouth as needed for anxiety.    Marland Kitchen aspirin EC 81 MG tablet Take 1 tablet (81 mg total) by mouth daily.    . cyanocobalamin (,VITAMIN B-12,) 1000 MCG/ML injection Inject 1 mL (1,000 mcg total) into the muscle every 30 (thirty) days. 10 mL 1  . esomeprazole (NEXIUM) 40 MG capsule Take 40 mg by mouth 2 (two) times daily.  0  . fluticasone (FLONASE) 50 MCG/ACT nasal spray Place into the nose.    . gabapentin (NEURONTIN) 300 MG capsule Take 1 capsule (300 mg total) by mouth 2 (two) times daily. 180 capsule 1  . HYDROcodone-acetaminophen (NORCO) 10-325 MG tablet take 1 tablet by mouth every 6 to 8 hours if needed for pain  0  . lidocaine (LIDODERM) 5 % apply patch to affected area once daily as directed for lower back pain  0  . lidocaine-prilocaine (EMLA) cream Apply to affected area once 30 g 3  . meclizine (ANTIVERT) 12.5 MG tablet Take 12.5 mg by mouth every 6 (six) hours as needed for dizziness.   0  . metFORMIN (GLUCOPHAGE-XR) 500 MG 24 hr tablet Take by mouth. Takes one tablet in AM and 2 tablets at night.    .  metoprolol succinate (TOPROL XL) 25 MG 24 hr tablet Take 1 tablet (25 mg total) by mouth daily. 90 tablet 3  . nitroGLYCERIN (NITROSTAT) 0.4 MG SL tablet PLACE 1 TABLET UNDER THE TONGUE EVERY 5 MINUTES X 3 DOSES AS NEEDED FOR CHEST PAIN *MAX 3 DOSES* 25 tablet 5  . ondansetron (ZOFRAN) 8 MG tablet Take 1 tablet (8 mg total) by mouth 2 (two) times daily as needed for refractory nausea / vomiting. Start on day 2 after bendamustine 30 tablet 1  . predniSONE (DELTASONE) 20 MG tablet Take 1 tablet (20 mg total) by mouth daily with breakfast. 30 tablet 1  . pregabalin (LYRICA) 100 MG capsule 1 cap three times daily and 2 caps at hs 450 capsule 1  . prochlorperazine (COMPAZINE) 10 MG tablet Take 1 tablet (10 mg total) by mouth every 6 (six) hours as needed (Nausea or vomiting). 30 tablet 1  . rosuvastatin (CRESTOR) 5 MG tablet Take 1 tablet (5 mg total) by  mouth daily. 90 tablet 3  . sildenafil (VIAGRA) 100 MG tablet Take 0.5-1 tablets (50-100 mg total) by mouth daily as needed for erectile dysfunction (30 minutes prior to sexual intercourse). 10 tablet 6  . tetrahydrozoline 0.05 % ophthalmic solution Place 1 drop into both eyes as needed. Red eyes    . zolpidem (AMBIEN) 10 MG tablet Take 10 mg by mouth at bedtime as needed for sleep.   0   No current facility-administered medications for this visit.    Facility-Administered Medications Ordered in Other Visits  Medication Dose Route Frequency Provider Last Rate Last Dose  . 0.9 %  sodium chloride infusion   Intravenous Continuous Alvy Bimler, Jasmene Goswami, MD 50 mL/hr at 03/07/14 1005    . 0.9 %  sodium chloride infusion   Intravenous Once Alvy Bimler, Alexandro Line, MD      . acetaminophen (TYLENOL) tablet 650 mg  650 mg Oral Once Alvy Bimler, Moataz Tavis, MD      . bendamustine (BENDEKA) 125 mg in sodium chloride 0.9 % 50 mL (2.2727 mg/mL) chemo infusion  54 mg/m2 (Treatment Plan Recorded) Intravenous Once Alvy Bimler, Marquize Seib, MD      . dexamethasone (DECADRON) injection 10 mg  10 mg Intravenous Once  Alvy Bimler, Deloy Archey, MD      . diphenhydrAMINE (BENADRYL) capsule 50 mg  50 mg Oral Once Alvy Bimler, Envi Eagleson, MD      . famotidine (PEPCID) IVPB 20 mg premix  20 mg Intravenous Once Alvy Bimler, Makenli Derstine, MD      . heparin lock flush 100 unit/mL  500 Units Intracatheter Once PRN Alvy Bimler, Nesreen Albano, MD      . palonosetron (ALOXI) injection 0.25 mg  0.25 mg Intravenous Once Alvy Bimler, Suriya Kovarik, MD      . riTUXimab (RITUXAN) 900 mg in sodium chloride 0.9 % 250 mL (2.6471 mg/mL) chemo infusion  375 mg/m2 (Treatment Plan Recorded) Intravenous Once Aluel Schwarz, MD      . sodium chloride 0.9 % injection 10 mL  10 mL Intracatheter PRN Marcy Panning, MD   10 mL at 08/22/12 1721  . sodium chloride flush (NS) 0.9 % injection 10 mL  10 mL Intracatheter PRN Alvy Bimler, Glendora Clouatre, MD        PHYSICAL EXAMINATION: ECOG PERFORMANCE STATUS: 1 - Symptomatic but completely ambulatory  Vitals:   02/23/17 1023  BP: (!) 144/74  Pulse: 70  Resp: 18  Temp: 97.8 F (36.6 C)  SpO2: 97%   Filed Weights   02/23/17 1023  Weight: 213 lb (96.6 kg)    GENERAL:alert, no distress and comfortable SKIN: skin color, texture, turgor are normal, no rashes or significant lesions EYES: normal, Conjunctiva are pink and non-injected, sclera clear OROPHARYNX:no exudate, no erythema and lips, buccal mucosa, and tongue normal  NECK: supple, thyroid normal size, non-tender, without nodularity LYMPH:  no palpable lymphadenopathy in the cervical, axillary or inguinal LUNGS: Diffuse wheezes noted  hEART: regular rate & rhythm and no murmurs and no lower extremity edema ABDOMEN:abdomen soft, non-tender and normal bowel sounds Musculoskeletal:no cyanosis of digits and no clubbing  NEURO: alert & oriented x 3 with fluent speech, no focal motor/sensory deficits  LABORATORY DATA:  I have reviewed the data as listed    Component Value Date/Time   NA 137 02/23/2017 0937   NA 136 01/26/2017 0940   K 4.0 02/23/2017 0937   K 4.0 01/26/2017 0940   CL 105 02/23/2017 0937   CL 104  08/03/2012 1229   CO2 24 02/23/2017 0937   CO2 23 01/26/2017 0940   GLUCOSE  122 02/23/2017 0937   GLUCOSE 146 (H) 01/26/2017 0940   GLUCOSE 223 (H) 08/03/2012 1229   BUN 8 02/23/2017 0937   BUN 9.6 01/26/2017 0940   CREATININE 0.89 02/23/2017 0937   CREATININE 1.0 01/26/2017 0940   CALCIUM 9.2 02/23/2017 0937   CALCIUM 8.7 01/26/2017 0940   PROT 6.2 (L) 02/23/2017 0937   PROT 5.7 (L) 01/26/2017 0940   ALBUMIN 3.5 02/23/2017 0937   ALBUMIN 3.6 01/26/2017 0940   AST 9 02/23/2017 0937   AST 13 01/26/2017 0940   ALT 8 02/23/2017 0937   ALT 12 01/26/2017 0940   ALKPHOS 81 02/23/2017 0937   ALKPHOS 78 01/26/2017 0940   BILITOT 0.5 02/23/2017 0937   BILITOT 0.64 01/26/2017 0940   GFRNONAA >60 02/23/2017 0937   GFRAA >60 02/23/2017 0937    No results found for: SPEP, UPEP  Lab Results  Component Value Date   WBC 6.2 02/23/2017   NEUTROABS 5.1 02/23/2017   HGB 13.7 02/23/2017   HCT 44.2 02/23/2017   MCV 110.5 (H) 02/23/2017   PLT 71 (L) 02/23/2017      Chemistry      Component Value Date/Time   NA 137 02/23/2017 0937   NA 136 01/26/2017 0940   K 4.0 02/23/2017 0937   K 4.0 01/26/2017 0940   CL 105 02/23/2017 0937   CL 104 08/03/2012 1229   CO2 24 02/23/2017 0937   CO2 23 01/26/2017 0940   BUN 8 02/23/2017 0937   BUN 9.6 01/26/2017 0940   CREATININE 0.89 02/23/2017 0937   CREATININE 1.0 01/26/2017 0940      Component Value Date/Time   CALCIUM 9.2 02/23/2017 0937   CALCIUM 8.7 01/26/2017 0940   ALKPHOS 81 02/23/2017 0937   ALKPHOS 78 01/26/2017 0940   AST 9 02/23/2017 0937   AST 13 01/26/2017 0940   ALT 8 02/23/2017 0937   ALT 12 01/26/2017 0940   BILITOT 0.5 02/23/2017 0937   BILITOT 0.64 01/26/2017 0940     ASSESSMENT & PLAN:  CLL (chronic lymphocytic leukemia) The patient continues to feel ill with fatigue and recent weight loss He had recurrent cough and sinus congestion despite multiple courses of antibiotics Recent CT scan showed partial response to  treatment I recommend repeat imaging study with a PET CT scan after this cycle and he agreed to proceed  Thrombocytopenia He has persistent pancytopenia due to side effects of treatment and chronic splenomegaly We will proceed with treatment with dose adjustment  COPD mixed type ([place]) He has chronic cough Examination refute diffuse wheezes I recommend a course of prednisone taper  Hypogammaglobulinemia, acquired I have discontinue IVIG due to chemotherapy He had recurrent unresolved infection Plan to check his immunoglobulins again next  month   Orders Placed This Encounter  Procedures  . NM PET Image Restag (PS) Skull Base To Thigh    Standing Status:   Future    Standing Expiration Date:   02/23/2018    Order Specific Question:   If indicated for the ordered procedure, I authorize the administration of a radiopharmaceutical per Radiology protocol    Answer:   Yes    Order Specific Question:   Preferred imaging location?    Answer:   [facility]    Order Specific Question:   Radiology Contrast Protocol - do NOT remove file path    Answer:   [email or web address]  . IgG, IgA, IgM    Standing Status:   Future    Standing Expiration  Date:   03/30/2018   All questions were answered. The patient knows to call the clinic with any problems, questions or concerns. No barriers to learning was detected. I spent 30 minutes counseling the patient face to face. The total time spent in the appointment was 40 minutes and more than 50% was on counseling and review of test results     [name], MD 02/23/2017 12:17 PM

## 2017-02-23 NOTE — Assessment & Plan Note (Signed)
He has chronic cough Examination refute diffuse wheezes I recommend a course of prednisone taper

## 2017-02-23 NOTE — Progress Notes (Signed)
Per treatment parameters, ok to treat with today's platelet count.

## 2017-02-23 NOTE — Assessment & Plan Note (Signed)
He has persistent pancytopenia due to side effects of treatment and chronic splenomegaly We will proceed with treatment with dose adjustment

## 2017-02-23 NOTE — Patient Instructions (Signed)
[place] Discharge Instructions for Patients Receiving Chemotherapy  Today you received the following chemotherapy agents rituximab (Rituxan) and bendamustine Verl Dicker)  To help prevent nausea and vomiting after your treatment, we encourage you to take your nausea medication as directed.   If you develop nausea and vomiting that is not controlled by your nausea medication, call the clinic.   BELOW ARE SYMPTOMS THAT SHOULD BE REPORTED IMMEDIATELY:  *FEVER GREATER THAN 100.5 F  *CHILLS WITH OR WITHOUT FEVER  NAUSEA AND VOMITING THAT IS NOT CONTROLLED WITH YOUR NAUSEA MEDICATION  *UNUSUAL SHORTNESS OF BREATH  *UNUSUAL BRUISING OR BLEEDING  TENDERNESS IN MOUTH AND THROAT WITH OR WITHOUT PRESENCE OF ULCERS  *URINARY PROBLEMS  *BOWEL PROBLEMS  UNUSUAL RASH Items with * indicate a potential emergency and should be followed up as soon as possible.  Feel free to call the clinic should you have any questions or concerns. The clinic phone number is [phone].  Please show the [facility] at check-in to the Emergency Department and triage nurse.

## 2017-02-23 NOTE — Assessment & Plan Note (Signed)
I have discontinue IVIG due to chemotherapy He had recurrent unresolved infection Plan to check his immunoglobulins again next  month

## 2017-02-24 ENCOUNTER — Inpatient Hospital Stay: Payer: BLUE CROSS/BLUE SHIELD

## 2017-02-24 VITALS — BP 129/59 | HR 56 | Temp 97.5°F | Resp 18

## 2017-02-24 DIAGNOSIS — C9110 Chronic lymphocytic leukemia of B-cell type not having achieved remission: Secondary | ICD-10-CM

## 2017-02-24 MED ORDER — DEXAMETHASONE SODIUM PHOSPHATE 10 MG/ML IJ SOLN
10.0000 mg | Freq: Once | INTRAMUSCULAR | Status: AC
  Administered 2017-02-24: 10 mg via INTRAVENOUS

## 2017-02-24 MED ORDER — DEXAMETHASONE SODIUM PHOSPHATE 10 MG/ML IJ SOLN
INTRAMUSCULAR | Status: AC
  Filled 2017-02-24: qty 1

## 2017-02-24 MED ORDER — SODIUM CHLORIDE 0.9 % IV SOLN
54.0000 mg/m2 | Freq: Once | INTRAVENOUS | Status: AC
  Administered 2017-02-24: 125 mg via INTRAVENOUS
  Filled 2017-02-24: qty 5

## 2017-02-24 MED ORDER — HEPARIN SOD (PORK) LOCK FLUSH 100 UNIT/ML IV SOLN
500.0000 [IU] | Freq: Once | INTRAVENOUS | Status: AC | PRN
  Administered 2017-02-24: 500 [IU]
  Filled 2017-02-24: qty 5

## 2017-02-24 MED ORDER — SODIUM CHLORIDE 0.9 % IV SOLN
Freq: Once | INTRAVENOUS | Status: AC
  Administered 2017-02-24: 10:00:00 via INTRAVENOUS

## 2017-02-24 MED ORDER — SODIUM CHLORIDE 0.9% FLUSH
10.0000 mL | INTRAVENOUS | Status: DC | PRN
  Administered 2017-02-24: 10 mL
  Filled 2017-02-24: qty 10

## 2017-02-24 NOTE — Patient Instructions (Signed)
[employer] [facility] Discharge Instructions for Patients Receiving Chemotherapy  Today you received the following chemotherapy agents Bendeka  To help prevent nausea and vomiting after your treatment, we encourage you to take your nausea as directed   If you develop nausea and vomiting that is not controlled by your nausea medication, call the clinic.   BELOW ARE SYMPTOMS THAT SHOULD BE REPORTED IMMEDIATELY:  *FEVER GREATER THAN 100.5 F  *CHILLS WITH OR WITHOUT FEVER  NAUSEA AND VOMITING THAT IS NOT CONTROLLED WITH YOUR NAUSEA MEDICATION  *UNUSUAL SHORTNESS OF BREATH  *UNUSUAL BRUISING OR BLEEDING  TENDERNESS IN MOUTH AND THROAT WITH OR WITHOUT PRESENCE OF ULCERS  *URINARY PROBLEMS  *BOWEL PROBLEMS  UNUSUAL RASH Items with * indicate a potential emergency and should be followed up as soon as possible.  Feel free to call the clinic should you have any questions or concerns. The clinic phone number is [phone].  Please show the [facility] at check-in to the Emergency Department and triage nurse.

## 2017-03-06 ENCOUNTER — Telehealth: Payer: Self-pay | Admitting: *Deleted

## 2017-03-06 ENCOUNTER — Encounter: Payer: Self-pay | Admitting: Hematology and Oncology

## 2017-03-06 MED ORDER — LEVOFLOXACIN 500 MG PO TABS: 500.0000 mg | ORAL_TABLET | Freq: Every day | ORAL | 0 refills | Status: DC

## 2017-03-06 NOTE — Telephone Encounter (Signed)
Notified of Dr Alvy Bimler response to My Chart question. Prescription escribed to pharmacy

## 2017-03-16 ENCOUNTER — Encounter: Payer: Self-pay | Admitting: Hematology and Oncology

## 2017-03-17 ENCOUNTER — Encounter: Payer: Self-pay | Admitting: Hematology and Oncology

## 2017-03-17 ENCOUNTER — Other Ambulatory Visit: Payer: Self-pay

## 2017-03-17 MED ORDER — GABAPENTIN 300 MG PO CAPS: 300.0000 mg | ORAL_CAPSULE | Freq: Two times a day (BID) | ORAL | 0 refills | Status: DC

## 2017-03-20 ENCOUNTER — Other Ambulatory Visit: Payer: Self-pay

## 2017-03-20 ENCOUNTER — Encounter: Payer: Self-pay | Admitting: Hematology and Oncology

## 2017-03-20 ENCOUNTER — Telehealth: Payer: Self-pay

## 2017-03-20 MED ORDER — AMOXICILLIN 500 MG PO TABS: 500.0000 mg | ORAL_TABLET | Freq: Two times a day (BID) | ORAL | 0 refills | Status: AC

## 2017-03-20 NOTE — Telephone Encounter (Signed)
Called regarding mychart message. Sending Rx to pharmacy for Amoxicillin. Instructed to schedule appt with dentist for dental problems. Patient verbalized understanding.

## 2017-03-22 ENCOUNTER — Encounter (HOSPITAL_COMMUNITY)
Admission: RE | Admit: 2017-03-22 | Discharge: 2017-03-22 | Disposition: A | Discharge status: Home / Self Care | Payer: BLUE CROSS/BLUE SHIELD | Source: Ambulatory Visit | Attending: Hematology and Oncology | Admitting: Hematology and Oncology

## 2017-03-22 DIAGNOSIS — C9110 Chronic lymphocytic leukemia of B-cell type not having achieved remission: Secondary | ICD-10-CM

## 2017-03-22 DIAGNOSIS — R590 Localized enlarged lymph nodes: Secondary | ICD-10-CM | POA: Diagnosis not present

## 2017-03-22 DIAGNOSIS — J439 Emphysema, unspecified: Secondary | ICD-10-CM | POA: Insufficient documentation

## 2017-03-22 DIAGNOSIS — I700 Atherosclerosis of aorta: Secondary | ICD-10-CM | POA: Insufficient documentation

## 2017-03-22 DIAGNOSIS — C9190 Lymphoid leukemia, unspecified not having achieved remission: Secondary | ICD-10-CM | POA: Insufficient documentation

## 2017-03-22 DIAGNOSIS — I2510 Atherosclerotic heart disease of native coronary artery without angina pectoris: Secondary | ICD-10-CM | POA: Insufficient documentation

## 2017-03-22 DIAGNOSIS — R161 Splenomegaly, not elsewhere classified: Secondary | ICD-10-CM | POA: Diagnosis not present

## 2017-03-22 LAB — GLUCOSE, CAPILLARY: GLUCOSE-CAPILLARY: 56 mg/dL — AB (ref 65–99)

## 2017-03-22 MED ORDER — FLUDEOXYGLUCOSE F - 18 (FDG) INJECTION: 10.2800 | Freq: Once | INTRAVENOUS | Status: DC | PRN

## 2017-03-23 ENCOUNTER — Encounter: Payer: Self-pay | Admitting: Hematology and Oncology

## 2017-03-23 ENCOUNTER — Telehealth: Payer: Self-pay

## 2017-03-23 ENCOUNTER — Other Ambulatory Visit: Payer: BLUE CROSS/BLUE SHIELD

## 2017-03-23 NOTE — Telephone Encounter (Signed)
Spoke with pt by phone to inform PET results and no need for labs today per Dr Alvy Bimler and he will keep appt with Dr Alvy Bimler for 2/8. Pt verbalized understanding.

## 2017-03-24 ENCOUNTER — Encounter: Payer: Self-pay | Admitting: Hematology and Oncology

## 2017-03-24 ENCOUNTER — Inpatient Hospital Stay: Payer: BLUE CROSS/BLUE SHIELD | Attending: Hematology and Oncology | Admitting: Hematology and Oncology

## 2017-03-24 ENCOUNTER — Telehealth: Payer: Self-pay | Admitting: Hematology and Oncology

## 2017-03-24 DIAGNOSIS — D696 Thrombocytopenia, unspecified: Secondary | ICD-10-CM

## 2017-03-24 DIAGNOSIS — C8308 Small cell B-cell lymphoma, lymph nodes of multiple sites: Secondary | ICD-10-CM | POA: Diagnosis not present

## 2017-03-24 DIAGNOSIS — K089 Disorder of teeth and supporting structures, unspecified: Secondary | ICD-10-CM

## 2017-03-24 DIAGNOSIS — R161 Splenomegaly, not elsewhere classified: Secondary | ICD-10-CM | POA: Insufficient documentation

## 2017-03-24 DIAGNOSIS — J328 Other chronic sinusitis: Secondary | ICD-10-CM | POA: Diagnosis not present

## 2017-03-24 DIAGNOSIS — C8300 Small cell B-cell lymphoma, unspecified site: Secondary | ICD-10-CM | POA: Insufficient documentation

## 2017-03-24 DIAGNOSIS — J329 Chronic sinusitis, unspecified: Secondary | ICD-10-CM | POA: Insufficient documentation

## 2017-03-24 NOTE — Assessment & Plan Note (Addendum)
He has chronic bronchitis He felt that immunoglobulin infusion has helped him in the past I plan to repeat immunoglobulin levels in his next visit and will decide whether he would need to resume chronic IVIG therapy

## 2017-03-24 NOTE — Progress Notes (Signed)
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  No evidence of aortic dissection or other acute process in the chest. 2.  Centrilobular emphysema with a 5 mm right lung nodule. Given the concurrent centrilobular emphysema, follow-up chest CT at 6 -12 months is recommended.  3.  Coronary artery atherosclerosis which is age advanced. 4.  Prominent thoracic lymph nodes.  These can be reevaluated at follow-up.      05/06/2010 Imaging    1.  Multiple small periaortic lymph nodes consistent with the patient's history of the chronic lymphocytic leukemia. 2.  No evidence of solid organ involvement      11/03/2011 Imaging    1.  Interval progression of abdominal and pelvic adenopathy. 2.  Progression of splenomegaly.  The spleen now measures 23 cm in length      11/18/2011 Bone Marrow Biopsy    Bone Marrow, Aspirate,Biopsy, and Clot, right iliac bone - HYPERCELLULAR BONE MARROW WITH EXTENSIVE INVOLVEMENT BY CHRONIC LYMPHOCYTIC LEUKEMIA. PERIPHERAL BLOOD: - CHRONIC LYMPHOCYTIC LEUKEMIA      03/08/2012 Imaging    1.  Progressive increase in retroperitoneal, iliac, and inguinal lymphadenopathy. 2.  Interval increase in massive splenomegaly.       06/01/2012 Procedure    Placement of single lumen port a cath via right internal jugular vein.  The catheter tip lies at the cavoatrial junction.  A power injectable port a cath was placed and is ready for immediate use      06/20/2012 - 11/23/2012 Chemotherapy    He received FCR x 6 cycles      12/19/2012 Imaging    Left common iliac stent. Abdominal vasculature remains patent. Improving supraclavicular and axillary  lymphadenopathy. Residual right subpectoral nodes measure up to 10 mm short axis. Improving retroperitoneal lymphadenopathy, measuring up to 16 mm short axis. Improving splenomegaly, measuring 18.7 cm.       01/20/2016 Imaging    1. Stable exam.  No new or progressive findings. 2. No CT findings to explain odynophagia      09/02/2016 Pathology Results    The findings are consistent with involvement by previously known chronic lymphocytic leukemia      09/02/2016 Pathology Results    FISH for CLL came back positive for deletion 13q      09/15/2016 Imaging    1. Borderline enlarged abdominal and pelvic lymph nodes. Compared with 11/03/2011 these are decreased in size as detailed above. 2. Persistent splenomegaly. 3. Aortic Atherosclerosis (ICD10-I70.0). LAD coronary artery calcification noted.      10/06/2016 - 02/24/2017 Chemotherapy    He received Bendamustine and Rituxan      11/03/2016 Adverse Reaction    Dose of Bendamustine is reduced due to severe pancytopenia      12/28/2016 Imaging    1. Borderline enlarged abdominal peritoneal ligament and abdominal retroperitoneal lymph nodes, stable. 2. Splenomegaly. 3.  Aortic atherosclerosis (ICD10-170.0).      03/22/2017 PET scan    1. No hypermetabolic adenopathy identified within the neck, chest, abdomen or pelvis. 2. Prominent left retroperitoneal node measures 1.6 cm without significant FDG uptake. 3. Splenomegaly. 4. Aortic Atherosclerosis (ICD10-I70.0) and Emphysema (ICD10-J43.9). LAD and left circumflex atherosclerotic calcifications noted.  INTERVAL HISTORY: Please see below for problem oriented charting. He returns to review test results He feels well The dental abscess has resolved He denies pain He continues to have chronic sinus congestion No fever or chills No new lymphadenopathy  REVIEW OF SYSTEMS:   Constitutional: Denies fevers, chills or abnormal weight loss Eyes: Denies blurriness of vision Ears,  nose, mouth, throat, and face: Denies mucositis or sore throat Respiratory: Denies cough, dyspnea or wheezes Cardiovascular: Denies palpitation, chest discomfort or lower extremity swelling Gastrointestinal:  Denies nausea, heartburn or change in bowel habits Skin: Denies abnormal skin rashes Lymphatics: Denies new lymphadenopathy or easy bruising Neurological:Denies numbness, tingling or new weaknesses Behavioral/Psych: Mood is stable, no new changes  All other systems were reviewed with the patient and are negative.  I have reviewed the past medical history, past surgical history, social history and family history with the patient and they are unchanged from previous note.  ALLERGIES:  is allergic to codeine.  MEDICATIONS:  Current Outpatient Medications  Medication Sig Dispense Refill  . acetaminophen (TYLENOL) 500 MG tablet Take 500 mg by mouth every 6 (six) hours as needed for mild pain, moderate pain or headache.    Marland Kitchen acyclovir (ZOVIRAX) 400 MG tablet Take 1 tablet (400 mg total) by mouth 2 (two) times daily. 180 tablet 0  . albuterol (PROVENTIL HFA;VENTOLIN HFA) 108 (90 Base) MCG/ACT inhaler Inhale 2 puffs into the lungs every 6 (six) hours as needed for wheezing or shortness of breath. 1 Inhaler 2  . ALPRAZolam (XANAX) 0.25 MG tablet Take 0.25 mg by mouth as needed for anxiety.    Marland Kitchen amoxicillin (AMOXIL) 500 MG tablet Take 1 tablet (500 mg total) by mouth 2 (two) times daily for 7 days. 14 tablet 0  . aspirin EC 81 MG tablet Take 1 tablet (81 mg total) by mouth daily.    . cyanocobalamin (,VITAMIN B-12,) 1000 MCG/ML injection Inject 1 mL (1,000 mcg total) into the muscle every 30 (thirty) days. 10 mL 1  . esomeprazole (NEXIUM) 40 MG capsule Take 40 mg by mouth 2 (two) times daily.  0  . fluticasone (FLONASE) 50 MCG/ACT nasal spray Place into the nose.    . gabapentin (NEURONTIN) 300 MG capsule Take 1 capsule (300 mg total) by mouth 2 (two) times daily. 180 capsule 0  .  HYDROcodone-acetaminophen (NORCO) 10-325 MG tablet take 1 tablet by mouth every 6 to 8 hours if needed for pain  0  . levofloxacin (LEVAQUIN) 500 MG tablet Take 1 tablet (500 mg total) by mouth daily. 7 tablet 0  . lidocaine (LIDODERM) 5 % apply patch to affected area once daily as directed for lower back pain  0  . meclizine (ANTIVERT) 12.5 MG tablet Take 12.5 mg by mouth every 6 (six) hours as needed for dizziness.   0  . metFORMIN (GLUCOPHAGE-XR) 500 MG 24 hr tablet Take by mouth. Takes one tablet in AM and 2 tablets at night.    . metoprolol succinate (TOPROL XL) 25 MG 24 hr tablet Take 1 tablet (25 mg total) by mouth daily. 90 tablet 3  . nitroGLYCERIN (NITROSTAT) 0.4 MG SL tablet PLACE 1 TABLET UNDER THE TONGUE EVERY 5 MINUTES X 3 DOSES AS NEEDED FOR CHEST PAIN *MAX 3 DOSES* 25 tablet 5  . predniSONE (DELTASONE) 20 MG tablet Take 1 tablet (20 mg total) by mouth daily with breakfast. 30 tablet 1  . pregabalin (LYRICA) 100 MG capsule 1 cap three times daily and 2 caps at hs  450 capsule 1  . rosuvastatin (CRESTOR) 5 MG tablet Take 1 tablet (5 mg total) by mouth daily. 90 tablet 3  . sildenafil (VIAGRA) 100 MG tablet Take 0.5-1 tablets (50-100 mg total) by mouth daily as needed for erectile dysfunction (30 minutes prior to sexual intercourse). 10 tablet 6  . tetrahydrozoline 0.05 % ophthalmic solution Place 1 drop into both eyes as needed. Red eyes    . zolpidem (AMBIEN) 10 MG tablet Take 10 mg by mouth at bedtime as needed for sleep.   0   No current facility-administered medications for this visit.    Facility-Administered Medications Ordered in Other Visits  Medication Dose Route Frequency Provider Last Rate Last Dose  . 0.9 %  sodium chloride infusion   Intravenous Continuous Alvy Bimler, Elbie Statzer, MD 50 mL/hr at 03/07/14 1005    . fludeoxyglucose F - 18 (FDG) injection 76.73 millicurie  41.93 millicurie Intravenous Once PRN Kalman Jewels, MD      . sodium chloride 0.9 % injection 10 mL  10 mL  Intracatheter PRN Marcy Panning, MD   10 mL at 08/22/12 1721    PHYSICAL EXAMINATION: ECOG PERFORMANCE STATUS: 1 - Symptomatic but completely ambulatory  Vitals:   03/24/17 1236  BP: 137/65  Pulse: 70  Resp: 18  Temp: 97.7 F (36.5 C)  SpO2: 100%   Filed Weights   03/24/17 1236  Weight: 212 lb 6.4 oz (96.3 kg)    GENERAL:alert, no distress and comfortable SKIN: skin color, texture, turgor are normal, no rashes or significant lesions EYES: normal, Conjunctiva are pink and non-injected, sclera clear Musculoskeletal:no cyanosis of digits and no clubbing  NEURO: alert & oriented x 3 with fluent speech, no focal motor/sensory deficits  LABORATORY DATA:  I have reviewed the data as listed    Component Value Date/Time   NA 137 02/23/2017 0937   NA 136 01/26/2017 0940   K 4.0 02/23/2017 0937   K 4.0 01/26/2017 0940   CL 105 02/23/2017 0937   CL 104 08/03/2012 1229   CO2 24 02/23/2017 0937   CO2 23 01/26/2017 0940   GLUCOSE 122 02/23/2017 0937   GLUCOSE 146 (H) 01/26/2017 0940   GLUCOSE 223 (H) 08/03/2012 1229   BUN 8 02/23/2017 0937   BUN 9.6 01/26/2017 0940   CREATININE 0.89 02/23/2017 0937   CREATININE 1.0 01/26/2017 0940   CALCIUM 9.2 02/23/2017 0937   CALCIUM 8.7 01/26/2017 0940   PROT 6.2 (L) 02/23/2017 0937   PROT 5.7 (L) 01/26/2017 0940   ALBUMIN 3.5 02/23/2017 0937   ALBUMIN 3.6 01/26/2017 0940   AST 9 02/23/2017 0937   AST 13 01/26/2017 0940   ALT 8 02/23/2017 0937   ALT 12 01/26/2017 0940   ALKPHOS 81 02/23/2017 0937   ALKPHOS 78 01/26/2017 0940   BILITOT 0.5 02/23/2017 0937   BILITOT 0.64 01/26/2017 0940   GFRNONAA >60 02/23/2017 0937   GFRAA >60 02/23/2017 0937    No results found for: SPEP, UPEP  Lab Results  Component Value Date   WBC 6.2 02/23/2017   NEUTROABS 5.1 02/23/2017   HGB 13.7 02/23/2017   HCT 44.2 02/23/2017   MCV 110.5 (H) 02/23/2017   PLT 71 (L) 02/23/2017      Chemistry      Component Value Date/Time   NA 137 02/23/2017  0937   NA 136 01/26/2017 0940   K 4.0 02/23/2017 0937   K 4.0 01/26/2017 0940   CL 105 02/23/2017 0937   CL 104 08/03/2012 1229  CO2 24 02/23/2017 0937   CO2 23 01/26/2017 0940   BUN 8 02/23/2017 0937   BUN 9.6 01/26/2017 0940   CREATININE 0.89 02/23/2017 0937   CREATININE 1.0 01/26/2017 0940      Component Value Date/Time   CALCIUM 9.2 02/23/2017 0937   CALCIUM 8.7 01/26/2017 0940   ALKPHOS 81 02/23/2017 0937   ALKPHOS 78 01/26/2017 0940   AST 9 02/23/2017 0937   AST 13 01/26/2017 0940   ALT 8 02/23/2017 0937   ALT 12 01/26/2017 0940   BILITOT 0.5 02/23/2017 0937   BILITOT 0.64 01/26/2017 0940       RADIOGRAPHIC STUDIES: I have reviewed the imaging study with the patient I have personally reviewed the radiological images as listed and agreed with the findings in the report. Nm Pet Image Restag (ps) Skull Base To Thigh  Result Date: 03/22/2017 CLINICAL DATA:  Subsequent treatment strategy for chronic lymphocytic leukemia. EXAM: NUCLEAR MEDICINE PET SKULL BASE TO THIGH TECHNIQUE: 10.28 mCi F-18 FDG was injected intravenously. Full-ring PET imaging was performed from the skull base to thigh after the radiotracer. CT data was obtained and used for attenuation correction and anatomic localization. FASTING BLOOD GLUCOSE:  Value: 56 mg/dl COMPARISON:  None. FINDINGS: NECK: Opacification of the maxillary sinuses and ethmoid air cells identified. No hypermetabolic lymph nodes. CHEST: The heart size appears to the mildly enlarged. No pericardial effusion. Calcification in the LAD and left circumflex coronary arteries noted. No hypermetabolic mediastinal or hilar lymph nodes. No hypermetabolic axillary or supraclavicular adenopathy. No pleural effusion. Right midlung calcified granuloma. No suspicious nodules. ABDOMEN/PELVIS: There is no abnormal uptake within the liver. Previous cholecystectomy. No abnormal uptake within the pancreas. The spleen measures 11.6 by 6.3 by 16.9 cm (volume = 650  cm^3). Uptake within the spleen is similar to the liver. Normal adrenal glands. Unremarkable appearance of both kidneys. Prominent retroperitoneal lymph nodes are identified. The largest is in the left retroperitoneum measuring 1.6 cm within SUV max equal to 1.85. No hypermetabolic pelvic or inguinal adenopathy. Aortic atherosclerosis. No aneurysm. SKELETON: No focal hypermetabolic activity to suggest skeletal metastasis. IMPRESSION: 1. No hypermetabolic adenopathy identified within the neck, chest, abdomen or pelvis. 2. Prominent left retroperitoneal node measures 1.6 cm without significant FDG uptake. 3. Splenomegaly. 4. Aortic Atherosclerosis (ICD10-I70.0) and Emphysema (ICD10-J43.9). LAD and left circumflex atherosclerotic calcifications noted. Electronically Signed   By: Kerby Moors M.D.   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 persist due to chronic splenomegaly I do not recommend maintenance treatment The patient would like to keep his port for now   Chronic sinusitis He has chronic bronchitis He felt that immunoglobulin infusion has helped him in the past I plan to repeat immunoglobulin levels in his next visit and will decide whether he would need to resume chronic IVIG therapy  Poor dentition He had recent dental abscess, responded well to amoxicillin I strongly recommend he discuss further options with his oral surgeon He has chronic thrombocytopenia In my opinion, a platelet count over 50,000 should be adequate for most surgeries However, if his surgeon felt he needs platelet count above 100,000, we will give him preoperative platelet transfusion prior to surgery From the hematology standpoint, there is no contraindication for him to proceed with dental surgery as needed  Thrombocytopenia He has  chronic thrombocytopenia  likely due to chronic splenomegaly This is stable There is no contraindication to remain on antiplatelet agents or anticoagulants as long as the platelet is greater than 50,000.     No orders of the defined types were placed in this encounter.  All questions were answered. The patient knows to call the clinic with any problems, questions or concerns. No barriers to learning was detected. I spent 25 minutes counseling the patient face to face. The total time spent in the appointment was 30 minutes and more than 50% was on counseling and review of test results     [name], MD 03/24/2017 1:11 PM

## 2017-03-24 NOTE — Assessment & Plan Note (Signed)
He had recent dental abscess, responded well to amoxicillin I strongly recommend he discuss further options with his oral surgeon He has chronic thrombocytopenia In my opinion, a platelet count over 50,000 should be adequate for most surgeries However, if his surgeon felt he needs platelet count above 100,000, we will give him preoperative platelet transfusion prior to surgery From the hematology standpoint, there is no contraindication for him to proceed with dental surgery as needed

## 2017-03-24 NOTE — Assessment & Plan Note (Signed)
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

## 2017-03-24 NOTE — Telephone Encounter (Signed)
Patient declined AVs and calendar of upcoming March appointments

## 2017-03-24 NOTE — Assessment & Plan Note (Signed)
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

## 2017-04-04 ENCOUNTER — Other Ambulatory Visit: Payer: Self-pay | Admitting: Hematology and Oncology

## 2017-04-04 DIAGNOSIS — C9110 Chronic lymphocytic leukemia of B-cell type not having achieved remission: Secondary | ICD-10-CM

## 2017-04-13 ENCOUNTER — Other Ambulatory Visit: Payer: Self-pay | Admitting: Nurse Practitioner

## 2017-04-13 ENCOUNTER — Ambulatory Visit (INDEPENDENT_AMBULATORY_CARE_PROVIDER_SITE_OTHER): Payer: BLUE CROSS/BLUE SHIELD | Admitting: Neurology

## 2017-04-13 ENCOUNTER — Encounter: Payer: Self-pay | Admitting: *Deleted

## 2017-04-13 ENCOUNTER — Encounter: Payer: Self-pay | Admitting: Neurology

## 2017-04-13 ENCOUNTER — Telehealth: Payer: Self-pay | Admitting: Neurology

## 2017-04-13 VITALS — BP 108/67 | HR 75 | Ht 72.0 in | Wt 213.5 lb

## 2017-04-13 DIAGNOSIS — G629 Polyneuropathy, unspecified: Secondary | ICD-10-CM | POA: Diagnosis not present

## 2017-04-13 DIAGNOSIS — E7800 Pure hypercholesterolemia, unspecified: Secondary | ICD-10-CM

## 2017-04-13 DIAGNOSIS — I2510 Atherosclerotic heart disease of native coronary artery without angina pectoris: Secondary | ICD-10-CM

## 2017-04-13 MED ORDER — NORTRIPTYLINE HCL 25 MG PO CAPS: 100.0000 mg | ORAL_CAPSULE | Freq: Every day | ORAL | 11 refills | Status: DC

## 2017-04-13 MED ORDER — LIDOCAINE-CAPSAICIN 4-0.1 % EX CREA: 4.0000 g | TOPICAL_CREAM | CUTANEOUS | 11 refills | Status: DC | PRN

## 2017-04-13 MED ORDER — PREGABALIN 200 MG PO CAPS: ORAL_CAPSULE | ORAL | 4 refills | Status: DC

## 2017-04-13 NOTE — Telephone Encounter (Signed)
[place] called she needs clarification on Lidocaine-Capsaicin 4-0.1 % CREAm. Please call to advise

## 2017-04-13 NOTE — Telephone Encounter (Signed)
Spoke to [place] at [facility] - the lidocaine-capsaicin creme is not covered by insurance in addition to not being available to order.  They have two OTC products that are similar:  1) lidocaine 4% w/ menthol gel - $10.00 per tube 2) capsaisin 0.1% cream - $14 per tube  Dr. Krista Blue is okay with this substitution.  I attempted to call the patient to explain the situation but he did not answer and is voicemail box was full.  I called the pharmacy back again and they will let the patient know the situation when he comes to pick up the prescription.  I provided our number for him to call with any questions.

## 2017-04-13 NOTE — Progress Notes (Signed)
[facility]  PATIENT: Ryan Copeland DOB: Jul 20, 1950  HISTORY OF PRESENT ILLNESS:  [name] has been followed by our clinic for a long time for peripheral neuropathy, previously he was a patient of Dr. Doy Mince, since he left the practice, he was followed by nurse practitioner Hoyle Sauer.  I reviewed his oncologist note from Dr. 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, chronic lymphocytic leukemia, no evidence of solid organ involvement, he received chemotherapy FCR x6 cycles from May 2014 to October 2014, chemotherapy Bendamustine and and rituximab from October 06, 2016 to February 24, 2017,  Most recent CAT scan on March 23, 8411, no hypermetabolic adenopathy identified within the neck, chest, abdomen or pelvic, prominent left retroperitoneal lymph node measuring 1.6 cm without significant FDG uptake, splenomegaly,  He has diabetes since 2012, still works as an Chief Financial Officer,  Is diabetic symptoms started around 2009, he noticed bilateral feet numbness tingling, at the bottom of his feet, actually underwent bilateral tarsal tunnel release surgery did not help his symptoms, reported abnormal EMG nerve conduction study in the past from outside office, never had a repeat study in the laboratory,  Reviewing the record, previously had evidence of vitamin B12, has been on vitamin B12 IM supplement since,  He has tried and failed gabapentin, Cymbalta, Topamax, currently taking Lyrica 100 mg 3 times a day +200 mg at bedtime, and gabapentin 300 mg twice a day.   Laboratory evaluations in April 2018, triglyceride 201, LDL 79, A1c was 7.2  REVIEW OF SYSTEMS: Full 14 system review of systems performed and notable only for those listed, all others are neg:     ALLERGIES: Allergies  Allergen Reactions  . Codeine Hives    Pt states he can take a few, more reaction with extended doses.    HOME MEDICATIONS: Outpatient Medications Prior to Visit    Medication Sig Dispense Refill  . acetaminophen (TYLENOL) 500 MG tablet Take 500 mg by mouth every 6 (six) hours as needed for mild pain, moderate pain or headache.    Marland Kitchen acyclovir (ZOVIRAX) 400 MG tablet TAKE 1 TABLET BY MOUTH TWICE A DAY 180 tablet 0  . albuterol (PROVENTIL HFA;VENTOLIN HFA) 108 (90 Base) MCG/ACT inhaler Inhale 2 puffs into the lungs every 6 (six) hours as needed for wheezing or shortness of breath. 1 Inhaler 2  . ALPRAZolam (XANAX) 0.25 MG tablet Take 0.25 mg by mouth as needed for anxiety.    Marland Kitchen aspirin EC 81 MG tablet Take 1 tablet (81 mg total) by mouth daily.    . cyanocobalamin (,VITAMIN B-12,) 1000 MCG/ML injection Inject 1 mL (1,000 mcg total) into the muscle every 30 (thirty) days. 10 mL 1  . esomeprazole (NEXIUM) 40 MG capsule Take 40 mg by mouth 2 (two) times daily.  0  . fluticasone (FLONASE) 50 MCG/ACT nasal spray Place into the nose.    . gabapentin (NEURONTIN) 300 MG capsule Take 1 capsule (300 mg total) by mouth 2 (two) times daily. 180 capsule 0  . HYDROcodone-acetaminophen (NORCO) 10-325 MG tablet take 1 tablet by mouth every 6 to 8 hours if needed for pain  0  . lidocaine (LIDODERM) 5 % apply patch to affected area once daily as directed for lower back pain  0  . meclizine (ANTIVERT) 12.5 MG tablet Take 12.5 mg by mouth every 6 (six) hours as needed for dizziness.   0  . metFORMIN (GLUCOPHAGE-XR) 500 MG 24 hr tablet Take by mouth. Takes  one tablet in AM and 2 tablets at night.    . metoprolol succinate (TOPROL XL) 25 MG 24 hr tablet Take 1 tablet (25 mg total) by mouth daily. 90 tablet 3  . nitroGLYCERIN (NITROSTAT) 0.4 MG SL tablet PLACE 1 TABLET UNDER THE TONGUE EVERY 5 MINUTES X 3 DOSES AS NEEDED FOR CHEST PAIN *MAX 3 DOSES* 25 tablet 5  . pregabalin (LYRICA) 100 MG capsule 1 cap three times daily and 2 caps at hs 450 capsule 1  . rosuvastatin (CRESTOR) 5 MG tablet Take 1 tablet (5 mg total) by mouth daily. 90 tablet 3  . sildenafil (VIAGRA) 100 MG tablet  Take 0.5-1 tablets (50-100 mg total) by mouth daily as needed for erectile dysfunction (30 minutes prior to sexual intercourse). 10 tablet 6  . tetrahydrozoline 0.05 % ophthalmic solution Place 1 drop into both eyes as needed. Red eyes    . zolpidem (AMBIEN) 10 MG tablet Take 10 mg by mouth at bedtime as needed for sleep.   0  . levofloxacin (LEVAQUIN) 500 MG tablet Take 1 tablet (500 mg total) by mouth daily. 7 tablet 0  . predniSONE (DELTASONE) 20 MG tablet Take 1 tablet (20 mg total) by mouth daily with breakfast. 30 tablet 1   Facility-Administered Medications Prior to Visit  Medication Dose Route Frequency Provider Last Rate Last Dose  . 0.9 %  sodium chloride infusion   Intravenous Continuous Alvy Bimler, Ni, MD 50 mL/hr at 03/07/14 1005    . sodium chloride 0.9 % injection 10 mL  10 mL Intracatheter PRN Marcy Panning, MD   10 mL at 08/22/12 1721    PAST MEDICAL HISTORY: Past Medical History:  Diagnosis Date  . Anxiety 01/30/2014  . Back injury    lower disc  . CAD (coronary artery disease)   . CLL (chronic lymphocytic leukemia) ([place]) 03/18/2011  . Diabetes mellitus ([place])    Type 2   . ECRB (extensor carpi radialis brevis) tenosynovitis   . GERD (gastroesophageal reflux disease)    takes Nexium if needed  . Hyperlipidemia   . Lateral epicondylitis of left elbow   . MI, acute, non ST segment elevation ([place]) 06/28/2009   with stenting of the LAD  . Neuromuscular disorder ([place])    peripheral neuropathy  . PVD (peripheral vascular disease) ([place])   . Tobacco abuse     PAST SURGICAL HISTORY: Past Surgical History:  Procedure Laterality Date  . ADENOIDECTOMY  1955  . CARDIAC CATHETERIZATION    . CARDIAC CATHETERIZATION N/A 09/26/2014   Procedure: Left Heart Cath and Coronary Angiography;  Surgeon: Peter M Martinique, MD;  Location: [place] [facility];  Service: Cardiovascular;  Laterality: N/A;  . carpel tunnel release Left 04-1989  . carpel tunnel release  Right 01-1989  .  CHOLECYSTECTOMY  2007  . CORONARY STENT PLACEMENT  May 2011  . femoral stents    . LATERAL EPICONDYLE RELEASE Left 02/12/2014   Procedure: LEFT ELBOW DEBRIDEMENT WITH TENDON REPAIR ;  Surgeon: Lorn Junes, MD;  Location: [place];  Service: Orthopedics;  Laterality: Left;  . LEFT CAI STENT/PTA AND POPLITEAL ARTERY/TIBIAL THROMBECTOMY     . LEFT HEART CATHETERIZATION WITH CORONARY ANGIOGRAM N/A 08/26/2011   Procedure: LEFT HEART CATHETERIZATION WITH CORONARY ANGIOGRAM;  Surgeon: Peter M Martinique, MD;  Location: [facility];  Service: Cardiovascular;  Laterality: N/A;  . PERIPHERAL VASCULAR CATHETERIZATION N/A 01/01/2015   Procedure: Abdominal Aortogram;  Surgeon: Conrad Summit Lake, MD;  Location: [place] [facility];  Service: Cardiovascular;  Laterality: N/A;  . TARSAL TUNNEL RELEASE Bilateral 08-2007    FAMILY HISTORY: Family History  Problem Relation Age of Onset  . Lung cancer Mother 47  . Cancer Mother        lung  . Heart failure Father 7  . Heart disease Father     SOCIAL HISTORY: Social History   Socioeconomic History  . Marital status: Married    Spouse name: [name]  . Number of children: 1  . Years of education: College  . Highest education level: Not on file  Social Needs  . Financial resource strain: Not on file  . Food insecurity - worry: Not on file  . Food insecurity - inability: Not on file  . Transportation needs - medical: Not on file  . Transportation needs - non-medical: Not on file  Occupational History    Employer: [employer]    Comment: [employer]  Tobacco Use  . Smoking status: Former Smoker    Packs/day: 0.50    Years: 44.00    Pack years: 22.00    Types: Cigarettes    Last attempt to quit: 04/23/2016    Years since quitting: 0.9  . Smokeless tobacco: Former Systems developer  . Tobacco comment: but does have ocassional relapses  Substance and Sexual Activity  . Alcohol use: No    Alcohol/week: 0.0 oz    Comment:  drinks non-alcoholic beer  . Drug  use: No  . Sexual activity: Yes  Other Topics Concern  . Not on file  Social History Narrative   Patient lives at home with wife.   Caffeine Use: 15 cups of caffeine weekly     PHYSICAL EXAM  Vitals:   04/13/17 0731  BP: 108/67  Pulse: 75  Weight: 213 lb 8 oz (96.8 kg)  Height: 6' (1.829 m)   Body mass index is 28.96 kg/m.   PHYSICAL EXAMNIATION:  Gen: NAD, conversant, well nourised, obese, well groomed                     Cardiovascular: Regular rate rhythm, no peripheral edema, warm, nontender. Eyes: Conjunctivae clear without exudates or hemorrhage Neck: Supple, no carotid bruits. Pulmonary: Clear to auscultation bilaterally   NEUROLOGICAL EXAM:  MENTAL STATUS: Speech:    Speech is normal; fluent and spontaneous with normal comprehension.  Cognition:     Orientation to time, place and person     Normal recent and remote memory     Normal Attention span and concentration     Normal Language, naming, repeating,spontaneous speech     Fund of knowledge   CRANIAL NERVES: CN II: Visual fields are full to confrontation. Fundoscopic exam is normal with sharp discs and no vascular changes. Pupils are round equal and briskly reactive to light. CN III, IV, VI: extraocular movement are normal. No ptosis. CN V: Facial sensation is intact to pinprick in all 3 divisions bilaterally. Corneal responses are intact.  CN VII: Face is symmetric with normal eye closure and smile. CN VIII: Hearing is normal to rubbing fingers CN IX, X: Palate elevates symmetrically. Phonation is normal. CN XI: Head turning and shoulder shrug are intact CN XII: Tongue is midline with normal movements and no atrophy.  MOTOR: There is no pronator drift of out-stretched arms. Muscle bulk and tone are normal. Muscle strength is normal.  REFLEXES: Reflexes are 2+ and symmetric at the biceps, triceps, knees, and absent ankles. Plantar responses are flexor.  SENSORY: Dependent decreased to  light  touch, pinprick, to above ankle level COORDINATION: Rapid alternating movements and fine finger movements are intact. There is no dysmetria on finger-to-nose and heel-knee-shin.    GAIT/STANCE: Posture is normal. Gait is steady with normal steps, base, arm swing, and turning. Heel and toe walking are normal. Tandem gait is normal.  Romberg is absent.    DIAGNOSTIC DATA (LABS, IMAGING, TESTING) - I reviewed patient records, labs, notes, testing and imaging myself where available.  Lab Results  Component Value Date   WBC 6.2 02/23/2017   HGB 13.7 02/23/2017   HCT 44.2 02/23/2017   MCV 110.5 (H) 02/23/2017   PLT 71 (L) 02/23/2017      Component Value Date/Time   NA 137 02/23/2017 0937   NA 136 01/26/2017 0940   K 4.0 02/23/2017 0937   K 4.0 01/26/2017 0940   CL 105 02/23/2017 0937   CL 104 08/03/2012 1229   CO2 24 02/23/2017 0937   CO2 23 01/26/2017 0940   GLUCOSE 122 02/23/2017 0937   GLUCOSE 146 (H) 01/26/2017 0940   GLUCOSE 223 (H) 08/03/2012 1229   BUN 8 02/23/2017 0937   BUN 9.6 01/26/2017 0940   CREATININE 0.89 02/23/2017 0937   CREATININE 1.0 01/26/2017 0940   CALCIUM 9.2 02/23/2017 0937   CALCIUM 8.7 01/26/2017 0940   PROT 6.2 (L) 02/23/2017 0937   PROT 5.7 (L) 01/26/2017 0940   ALBUMIN 3.5 02/23/2017 0937   ALBUMIN 3.6 01/26/2017 0940   AST 9 02/23/2017 0937   AST 13 01/26/2017 0940   ALT 8 02/23/2017 0937   ALT 12 01/26/2017 0940   ALKPHOS 81 02/23/2017 0937   ALKPHOS 78 01/26/2017 0940   BILITOT 0.5 02/23/2017 0937   BILITOT 0.64 01/26/2017 0940   GFRNONAA >60 02/23/2017 0937   GFRAA >60 02/23/2017 0937   Lab Results  Component Value Date   CHOL 146 05/24/2016   HDL 27 (L) 05/24/2016   LDLCALC 79 05/24/2016   LDLDIRECT 117.6 08/22/2011   TRIG 201 (H) 05/24/2016   CHOLHDL 5.4 (H) 05/24/2016   Lab Results  Component Value Date   HGBA1C 7.2 (H) 10/30/2015    ASSESSMENT AND PLAN 67 y.o. year old male   Peripheral neuropathy  Slow  progression of the years  Potential etiologies are diabetes, previous chemotherapy,  Suboptimal control of his neuropathic pain, will increase the Lyrica to 200 mg 3 times a day, add on nortriptyline 25 mg titrating to 100 mg every night  Laboratory evaluations for potential etiology,  Repeat EMG nerve conduction study   Marcial Pacas, M.D. Ph.D.  [facility] [place], [place] [address] Phone: [phone] Fax:      [phone]

## 2017-04-14 DIAGNOSIS — G629 Polyneuropathy, unspecified: Secondary | ICD-10-CM | POA: Insufficient documentation

## 2017-04-18 LAB — IMMUNOFIXATION ELECTROPHORESIS
IGA/IMMUNOGLOBULIN A, SERUM: 43 mg/dL — AB (ref 61–437)
IGM (IMMUNOGLOBULIN M), SRM: 20 mg/dL (ref 20–172)
IgG (Immunoglobin G), Serum: 335 mg/dL — ABNORMAL LOW (ref 700–1600)
TOTAL PROTEIN: 5.9 g/dL — AB (ref 6.0–8.5)

## 2017-04-18 LAB — RPR: RPR: NONREACTIVE

## 2017-04-18 LAB — VITAMIN B12: Vitamin B-12: 554 pg/mL (ref 232–1245)

## 2017-04-18 LAB — VITAMIN D 25 HYDROXY (VIT D DEFICIENCY, FRACTURES): Vit D, 25-Hydroxy: 21.5 ng/mL — ABNORMAL LOW (ref 30.0–100.0)

## 2017-04-18 LAB — HGB A1C W/O EAG: Hgb A1c MFr Bld: 6.2 % — ABNORMAL HIGH (ref 4.8–5.6)

## 2017-04-18 LAB — TSH: TSH: 1.62 u[IU]/mL (ref 0.450–4.500)

## 2017-04-18 LAB — FOLATE: Folate: 2.9 ng/mL — ABNORMAL LOW (ref >3.0)

## 2017-04-18 LAB — C-REACTIVE PROTEIN: CRP: 2.5 mg/L (ref 0.0–4.9)

## 2017-04-18 LAB — ANA W/REFLEX: Anti Nuclear Antibody(ANA): NEGATIVE

## 2017-04-21 ENCOUNTER — Encounter: Payer: Self-pay | Admitting: Hematology and Oncology

## 2017-04-21 ENCOUNTER — Inpatient Hospital Stay: Payer: BLUE CROSS/BLUE SHIELD

## 2017-04-21 ENCOUNTER — Inpatient Hospital Stay (HOSPITAL_BASED_OUTPATIENT_CLINIC_OR_DEPARTMENT_OTHER): Payer: BLUE CROSS/BLUE SHIELD | Admitting: Hematology and Oncology

## 2017-04-21 ENCOUNTER — Inpatient Hospital Stay: Payer: BLUE CROSS/BLUE SHIELD | Attending: Hematology and Oncology

## 2017-04-21 VITALS — BP 149/77 | HR 60 | Temp 97.4°F | Resp 18 | Ht 72.0 in | Wt 219.8 lb

## 2017-04-21 DIAGNOSIS — K089 Disorder of teeth and supporting structures, unspecified: Secondary | ICD-10-CM

## 2017-04-21 DIAGNOSIS — J328 Other chronic sinusitis: Secondary | ICD-10-CM | POA: Diagnosis not present

## 2017-04-21 DIAGNOSIS — C8308 Small cell B-cell lymphoma, lymph nodes of multiple sites: Secondary | ICD-10-CM | POA: Diagnosis not present

## 2017-04-21 DIAGNOSIS — D61818 Other pancytopenia: Secondary | ICD-10-CM

## 2017-04-21 DIAGNOSIS — D801 Nonfamilial hypogammaglobulinemia: Secondary | ICD-10-CM | POA: Diagnosis not present

## 2017-04-21 DIAGNOSIS — C9110 Chronic lymphocytic leukemia of B-cell type not having achieved remission: Secondary | ICD-10-CM

## 2017-04-21 LAB — COMPREHENSIVE METABOLIC PANEL
ALK PHOS: 104 U/L (ref 40–150)
ALT: 13 U/L (ref 0–55)
ANION GAP: 8 (ref 3–11)
AST: 12 U/L (ref 5–34)
Albumin: 3.6 g/dL (ref 3.5–5.0)
BILIRUBIN TOTAL: 0.4 mg/dL (ref 0.2–1.2)
BUN: 9 mg/dL (ref 7–26)
CALCIUM: 9.2 mg/dL (ref 8.4–10.4)
CO2: 26 mmol/L (ref 22–29)
CREATININE: 0.89 mg/dL (ref 0.70–1.30)
Chloride: 106 mmol/L (ref 98–109)
Glucose, Bld: 106 mg/dL (ref 70–140)
Potassium: 4.2 mmol/L (ref 3.5–5.1)
Sodium: 140 mmol/L (ref 136–145)
TOTAL PROTEIN: 5.7 g/dL — AB (ref 6.4–8.3)

## 2017-04-21 LAB — CBC WITH DIFFERENTIAL/PLATELET
BASOS ABS: 0 10*3/uL (ref 0.0–0.1)
BASOS PCT: 0 %
EOS ABS: 0 10*3/uL (ref 0.0–0.5)
EOS PCT: 1 %
HCT: 38.7 % (ref 38.4–49.9)
Hemoglobin: 13 g/dL (ref 13.0–17.1)
LYMPHS ABS: 0.4 10*3/uL — AB (ref 0.9–3.3)
Lymphocytes Relative: 12 %
MCH: 32.8 pg (ref 27.2–33.4)
MCHC: 33.6 g/dL (ref 32.0–36.0)
MCV: 97.5 fL (ref 79.3–98.0)
Monocytes Absolute: 0.3 10*3/uL (ref 0.1–0.9)
Monocytes Relative: 11 %
NEUTROS PCT: 76 %
Neutro Abs: 2.4 10*3/uL (ref 1.5–6.5)
PLATELETS: 86 10*3/uL — AB (ref 140–400)
RBC: 3.97 MIL/uL — ABNORMAL LOW (ref 4.20–5.82)
RDW: 17.9 % — ABNORMAL HIGH (ref 11.0–14.6)
WBC: 3.1 10*3/uL — ABNORMAL LOW (ref 4.0–10.3)

## 2017-04-21 MED ORDER — HEPARIN SOD (PORK) LOCK FLUSH 100 UNIT/ML IV SOLN
500.0000 [IU] | Freq: Once | INTRAVENOUS | Status: AC
  Administered 2017-04-21: 500 [IU]
  Filled 2017-04-21: qty 5

## 2017-04-21 MED ORDER — SODIUM CHLORIDE 0.9% FLUSH
10.0000 mL | Freq: Once | INTRAVENOUS | Status: AC
  Administered 2017-04-21: 10 mL
  Filled 2017-04-21: qty 10

## 2017-04-21 NOTE — Assessment & Plan Note (Addendum)
He has very poor dentition He cannot afford dental extraction I will refer him to another dentist to see if he can be admitted for complete dental extraction

## 2017-04-21 NOTE — Assessment & Plan Note (Addendum)
He has persistent, chronic hypogammaglobulinemia He had recurrent upper respiratory tract infection and desire IVIG treatment I will see if advanced home care service can provide care The patient is immunocompromised and should be treated at home if possible I would check with them to see if he would qualify for home based infusion therapy If not, he will return once a month for IVIG.  He will really be receiving 1 g/kg every 4 weeks.

## 2017-04-21 NOTE — Assessment & Plan Note (Signed)
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  Observe only for now.

## 2017-04-21 NOTE — Assessment & Plan Note (Signed)
Recent PET CT scan in February 2019 showed complete response His chronic thrombocytopenia will persist due to chronic splenomegaly I do not recommend maintenance treatment The patient would like to keep his port for now

## 2017-04-21 NOTE — Progress Notes (Signed)
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  He will really be receiving 1 g/kg every 4 weeks.  Poor dentition He has very poor dentition He cannot afford dental extraction I will refer him to another dentist to see if he can be admitted for complete dental extraction  Pancytopenia, acquired [facility]) He has persistent pancytopenia due to side effects of treatment and chronic splenomegaly He is not symptomatic.  Observe only for now.   Orders Placed This Encounter  Procedures  . Ambulatory Referral to Dentistry (specifically to Dr. Enrique Sack)    Referral Priority:   Routine    Referral Type:   Consultation    Referral Reason:   Specialty Services Required    Requested Specialty:   Dental General Practice    Number of Visits Requested:   1    INTERVAL HISTORY: Please see below for problem oriented charting. He returns for further follow-up His recent dental infection has resolved However, he cannot afford to pay a dentist out of pocket to get complete dental  extraction for $5000 He is requesting the help In the meantime, he continued to have chronic sinus congestion and recurrent upper respiratory tract infection.  He denies recent smoking. No new lymphadenopathy. The patient denies any recent signs or symptoms of bleeding such as spontaneous epistaxis, hematuria or hematochezia.  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  No evidence of aortic dissection or other acute process in the chest. 2.  Centrilobular emphysema with a 5 mm right lung nodule. Given the concurrent centrilobular emphysema, follow-up chest CT at 6 -12 months is recommended.  3.  Coronary artery atherosclerosis which is age advanced. 4.  Prominent thoracic lymph nodes.  These can be reevaluated at follow-up.      05/06/2010 Imaging    1.  Multiple small periaortic lymph nodes consistent with the patient's history of the chronic lymphocytic leukemia. 2.  No evidence of solid organ involvement      11/03/2011 Imaging    1.  Interval progression of abdominal and pelvic adenopathy. 2.  Progression of splenomegaly.  The spleen now measures 23 cm in length      11/18/2011 Bone Marrow Biopsy    Bone Marrow, Aspirate,Biopsy, and Clot, right iliac bone - HYPERCELLULAR BONE MARROW WITH EXTENSIVE INVOLVEMENT BY CHRONIC LYMPHOCYTIC LEUKEMIA. PERIPHERAL BLOOD: - CHRONIC LYMPHOCYTIC LEUKEMIA      03/08/2012 Imaging    1.  Progressive increase in retroperitoneal, iliac, and inguinal lymphadenopathy. 2.  Interval increase in massive splenomegaly.  06/01/2012 Procedure    Placement of single lumen port a cath via right internal jugular vein.  The catheter tip lies at the cavoatrial junction.  A power injectable port a cath was placed and is ready for immediate use      06/20/2012 - 11/23/2012 Chemotherapy    He received FCR x 6 cycles      12/19/2012 Imaging    Left common iliac stent.  Abdominal vasculature remains patent. Improving supraclavicular and axillary lymphadenopathy. Residual right subpectoral nodes measure up to 10 mm short axis. Improving retroperitoneal lymphadenopathy, measuring up to 16 mm short axis. Improving splenomegaly, measuring 18.7 cm.       01/20/2016 Imaging    1. Stable exam.  No new or progressive findings. 2. No CT findings to explain odynophagia      09/02/2016 Pathology Results    The findings are consistent with involvement by previously known chronic lymphocytic leukemia      09/02/2016 Pathology Results    FISH for CLL came back positive for deletion 13q      09/15/2016 Imaging    1. Borderline enlarged abdominal and pelvic lymph nodes. Compared with 11/03/2011 these are decreased in size as detailed above. 2. Persistent splenomegaly. 3. Aortic Atherosclerosis (ICD10-I70.0). LAD coronary artery calcification noted.      10/06/2016 - 02/24/2017 Chemotherapy    He received Bendamustine and Rituxan      11/03/2016 Adverse Reaction    Dose of Bendamustine is reduced due to severe pancytopenia      12/28/2016 Imaging    1. Borderline enlarged abdominal peritoneal ligament and abdominal retroperitoneal lymph nodes, stable. 2. Splenomegaly. 3.  Aortic atherosclerosis (ICD10-170.0).      03/22/2017 PET scan    1. No hypermetabolic adenopathy identified within the neck, chest, abdomen or pelvis. 2. Prominent left retroperitoneal node measures 1.6 cm without significant FDG uptake. 3. Splenomegaly. 4. Aortic Atherosclerosis (ICD10-I70.0) and Emphysema (ICD10-J43.9). LAD and left circumflex atherosclerotic calcifications noted.       REVIEW OF SYSTEMS:   Constitutional: Denies fevers, chills or abnormal weight loss Eyes: Denies blurriness of vision Ears, nose, mouth, throat, and face: Denies mucositis or sore throat Respiratory: Denies cough, dyspnea or wheezes Cardiovascular: Denies palpitation, chest discomfort or lower extremity  swelling Gastrointestinal:  Denies nausea, heartburn or change in bowel habits Skin: Denies abnormal skin rashes Lymphatics: Denies new lymphadenopathy or easy bruising Neurological:Denies numbness, tingling or new weaknesses Behavioral/Psych: Mood is stable, no new changes  All other systems were reviewed with the patient and are negative.  I have reviewed the past medical history, past surgical history, social history and family history with the patient and they are unchanged from previous note.  ALLERGIES:  is allergic to codeine.  MEDICATIONS:  Current Outpatient Medications  Medication Sig Dispense Refill  . acetaminophen (TYLENOL) 500 MG tablet Take 500 mg by mouth every 6 (six) hours as needed for mild pain, moderate pain or headache.    Marland Kitchen acyclovir (ZOVIRAX) 400 MG tablet TAKE 1 TABLET BY MOUTH TWICE A DAY 180 tablet 0  . albuterol (PROVENTIL HFA;VENTOLIN HFA) 108 (90 Base) MCG/ACT inhaler Inhale 2 puffs into the lungs every 6 (six) hours as needed for wheezing or shortness of breath. 1 Inhaler 2  . ALPRAZolam (XANAX) 0.25 MG tablet Take 0.25 mg by mouth as needed for anxiety.    Marland Kitchen aspirin EC 81 MG tablet Take 1 tablet (81 mg total) by mouth daily.    . Capsaicin 0.1 % CREA Apply topically.  OTC - use as needed for peripheral neuropathy.    . cyanocobalamin (,VITAMIN B-12,) 1000 MCG/ML injection Inject 1 mL (1,000 mcg total) into the muscle every 30 (thirty) days. 10 mL 1  . esomeprazole (NEXIUM) 40 MG capsule Take 40 mg by mouth 2 (two) times daily.  0  . fluticasone (FLONASE) 50 MCG/ACT nasal spray Place into the nose.    Marland Kitchen HYDROcodone-acetaminophen (NORCO) 10-325 MG tablet take 1 tablet by mouth every 6 to 8 hours if needed for pain  0  . lidocaine (LIDODERM) 5 % apply patch to affected area once daily as directed for lower back pain  0  . Lidocaine 4 % GEL Apply topically. OTC - use as needed for peripheral neuropathy.    . meclizine (ANTIVERT) 12.5 MG tablet Take 12.5 mg by  mouth every 6 (six) hours as needed for dizziness.   0  . metFORMIN (GLUCOPHAGE-XR) 500 MG 24 hr tablet Take by mouth. Takes one tablet in AM and 2 tablets at night.    . metoprolol succinate (TOPROL-XL) 25 MG 24 hr tablet TAKE 1 TABLET (25 MG TOTAL) BY MOUTH DAILY. 90 tablet 3  . nitroGLYCERIN (NITROSTAT) 0.4 MG SL tablet PLACE 1 TABLET UNDER THE TONGUE EVERY 5 MINUTES X 3 DOSES AS NEEDED FOR CHEST PAIN *MAX 3 DOSES* 25 tablet 5  . nortriptyline (PAMELOR) 25 MG capsule Take 4 capsules (100 mg total) by mouth at bedtime. 120 capsule 11  . pregabalin (LYRICA) 200 MG capsule 1 cap three times daily and 2 caps at hs 270 capsule 4  . rosuvastatin (CRESTOR) 5 MG tablet TAKE 1 TABLET (5 MG TOTAL) BY MOUTH DAILY. 90 tablet 3  . sildenafil (VIAGRA) 100 MG tablet Take 0.5-1 tablets (50-100 mg total) by mouth daily as needed for erectile dysfunction (30 minutes prior to sexual intercourse). 10 tablet 6  . tetrahydrozoline 0.05 % ophthalmic solution Place 1 drop into both eyes as needed. Red eyes    . zolpidem (AMBIEN) 10 MG tablet Take 10 mg by mouth at bedtime as needed for sleep.   0   No current facility-administered medications for this visit.    Facility-Administered Medications Ordered in Other Visits  Medication Dose Route Frequency Provider Last Rate Last Dose  . 0.9 %  sodium chloride infusion   Intravenous Continuous Alvy Bimler, Jemal Miskell, MD 50 mL/hr at 03/07/14 1005    . sodium chloride 0.9 % injection 10 mL  10 mL Intracatheter PRN Marcy Panning, MD   10 mL at 08/22/12 1721    PHYSICAL EXAMINATION: ECOG PERFORMANCE STATUS: 1 - Symptomatic but completely ambulatory  Vitals:   04/21/17 1447  BP: (!) 149/77  Pulse: 60  Resp: 18  Temp: (!) 97.4 F (36.3 C)  SpO2: 100%   Filed Weights   04/21/17 1447  Weight: 219 lb 12.8 oz (99.7 kg)    GENERAL:alert, no distress and comfortable SKIN: skin color, texture, turgor are normal, no rashes or significant lesions.  Noted skin bruising EYES:  normal, Conjunctiva are pink and non-injected, sclera clear OROPHARYNX:no exudate, no erythema and lips, buccal mucosa, and tongue normal. Very poor dentition is noted. NECK: supple, thyroid normal size, non-tender, without nodularity LYMPH:  no palpable lymphadenopathy in the cervical, axillary or inguinal LUNGS: clear to auscultation and percussion with normal breathing effort HEART: regular rate & rhythm and no murmurs and no lower extremity edema ABDOMEN:abdomen soft, non-tender and normal bowel sounds Musculoskeletal:no cyanosis of digits and no clubbing  NEURO: alert & oriented x  3 with fluent speech, no focal motor/sensory deficits  LABORATORY DATA:  I have reviewed the data as listed    Component Value Date/Time   NA 140 04/21/2017 1432   NA 136 01/26/2017 0940   K 4.2 04/21/2017 1432   K 4.0 01/26/2017 0940   CL 106 04/21/2017 1432   CL 104 08/03/2012 1229   CO2 26 04/21/2017 1432   CO2 23 01/26/2017 0940   GLUCOSE 106 04/21/2017 1432   GLUCOSE 146 (H) 01/26/2017 0940   GLUCOSE 223 (H) 08/03/2012 1229   BUN 9 04/21/2017 1432   BUN 9.6 01/26/2017 0940   CREATININE 0.89 04/21/2017 1432   CREATININE 1.0 01/26/2017 0940   CALCIUM 9.2 04/21/2017 1432   CALCIUM 8.7 01/26/2017 0940   PROT 5.7 (L) 04/21/2017 1432   PROT 5.9 (L) 04/13/2017 0823   PROT 5.7 (L) 01/26/2017 0940   ALBUMIN 3.6 04/21/2017 1432   ALBUMIN 3.6 01/26/2017 0940   AST 12 04/21/2017 1432   AST 13 01/26/2017 0940   ALT 13 04/21/2017 1432   ALT 12 01/26/2017 0940   ALKPHOS 104 04/21/2017 1432   ALKPHOS 78 01/26/2017 0940   BILITOT 0.4 04/21/2017 1432   BILITOT 0.64 01/26/2017 0940   GFRNONAA >60 04/21/2017 1432   GFRAA >60 04/21/2017 1432    No results found for: SPEP, UPEP  Lab Results  Component Value Date   WBC 3.1 (L) 04/21/2017   NEUTROABS 2.4 04/21/2017   HGB 13.0 04/21/2017   HCT 38.7 04/21/2017   MCV 97.5 04/21/2017   PLT 86 (L) 04/21/2017      Chemistry      Component Value  Date/Time   NA 140 04/21/2017 1432   NA 136 01/26/2017 0940   K 4.2 04/21/2017 1432   K 4.0 01/26/2017 0940   CL 106 04/21/2017 1432   CL 104 08/03/2012 1229   CO2 26 04/21/2017 1432   CO2 23 01/26/2017 0940   BUN 9 04/21/2017 1432   BUN 9.6 01/26/2017 0940   CREATININE 0.89 04/21/2017 1432   CREATININE 1.0 01/26/2017 0940      Component Value Date/Time   CALCIUM 9.2 04/21/2017 1432   CALCIUM 8.7 01/26/2017 0940   ALKPHOS 104 04/21/2017 1432   ALKPHOS 78 01/26/2017 0940   AST 12 04/21/2017 1432   AST 13 01/26/2017 0940   ALT 13 04/21/2017 1432   ALT 12 01/26/2017 0940   BILITOT 0.4 04/21/2017 1432   BILITOT 0.64 01/26/2017 0940     All questions were answered. The patient knows to call the clinic with any problems, questions or concerns. No barriers to learning was detected.  I spent 25 minutes counseling the patient face to face. The total time spent in the appointment was 40 minutes and more than 50% was on counseling and review of test results  [name], MD 04/21/2017 4:00 PM

## 2017-04-22 LAB — IGG, IGA, IGM
IGM (IMMUNOGLOBULIN M), SRM: 17 mg/dL — AB (ref 20–172)
IgA: 41 mg/dL — ABNORMAL LOW (ref 61–437)
IgG (Immunoglobin G), Serum: 318 mg/dL — ABNORMAL LOW (ref 700–1600)

## 2017-04-24 ENCOUNTER — Telehealth: Payer: Self-pay | Admitting: *Deleted

## 2017-04-24 NOTE — Telephone Encounter (Signed)
-----   Message from [name], MD sent at 04/24/2017  8:05 AM EDT ----- Regarding: IVIG I have asked Pam to look into [facility] to see if they can give him IVIG. She told me she will be able to get back to [place] on Thursday. Please let Mr. Buzby know we will get back to him as soon as we know

## 2017-04-24 NOTE — Telephone Encounter (Signed)
LM with note below 

## 2017-04-26 ENCOUNTER — Encounter (HOSPITAL_COMMUNITY): Payer: Self-pay | Admitting: Dentistry

## 2017-04-26 ENCOUNTER — Ambulatory Visit (HOSPITAL_COMMUNITY): Payer: Self-pay | Admitting: Dentistry

## 2017-04-26 VITALS — BP 128/79 | HR 64 | Temp 98.2°F

## 2017-04-26 DIAGNOSIS — M264 Malocclusion, unspecified: Secondary | ICD-10-CM

## 2017-04-26 DIAGNOSIS — C8308 Small cell B-cell lymphoma, lymph nodes of multiple sites: Secondary | ICD-10-CM

## 2017-04-26 DIAGNOSIS — Z9189 Other specified personal risk factors, not elsewhere classified: Secondary | ICD-10-CM

## 2017-04-26 DIAGNOSIS — K06020 Generalized gingival recession, unspecified: Secondary | ICD-10-CM

## 2017-04-26 DIAGNOSIS — D696 Thrombocytopenia, unspecified: Secondary | ICD-10-CM

## 2017-04-26 DIAGNOSIS — K0530 Chronic periodontitis, unspecified: Secondary | ICD-10-CM | POA: Insufficient documentation

## 2017-04-26 DIAGNOSIS — K117 Disturbances of salivary secretion: Secondary | ICD-10-CM

## 2017-04-26 DIAGNOSIS — K036 Deposits [accretions] on teeth: Secondary | ICD-10-CM

## 2017-04-26 DIAGNOSIS — K0889 Other specified disorders of teeth and supporting structures: Secondary | ICD-10-CM

## 2017-04-26 DIAGNOSIS — K08409 Partial loss of teeth, unspecified cause, unspecified class: Secondary | ICD-10-CM

## 2017-04-26 DIAGNOSIS — K083 Retained dental root: Secondary | ICD-10-CM

## 2017-04-26 DIAGNOSIS — K045 Chronic apical periodontitis: Secondary | ICD-10-CM | POA: Insufficient documentation

## 2017-04-26 DIAGNOSIS — K029 Dental caries, unspecified: Secondary | ICD-10-CM | POA: Insufficient documentation

## 2017-04-26 DIAGNOSIS — R682 Dry mouth, unspecified: Secondary | ICD-10-CM

## 2017-04-26 DIAGNOSIS — M2629 Other anomalies of dental arch relationship: Secondary | ICD-10-CM

## 2017-04-26 NOTE — Patient Instructions (Signed)
Patient is to follow up with Truitt Merle ,NP for for cardiology clearance for the extraction of remaining teeth with alveoloplasty in the operating room procedure with general anesthesia. Patient currently has an appointment for 05/22/2017. Patient will then contact dental medicine once he has been cleared by cardiology and we will schedule his surgery for extraction remaining teeth in the operating room with anesthesia.

## 2017-04-26 NOTE — Progress Notes (Signed)
DENTAL CONSULTATION  Date of Consultation:  04/26/2017 Patient Name:   Ryan Copeland Date of Birth:   12/13/1950 Medical Record Number: [id number]  VITALS: BP 128/79 (BP Location: Right Arm)   Pulse 64   Temp 98.2 F (36.8 C)   CHIEF COMPLAINT: Patient referred by Dr. Alvy Bimler for a dental consultation.  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  Patient was referred by Dr. Alvy Bimler for evaluation of poor dentition.  Patient now seen as part of a medically necessary dental consultation to rule out dental infection that may affect the patient's systemic health.  The patient currently denies acute toothaches, swellings, or abscesses. Patient did have an abscess involving the upper right quadrant 3-4 weeks ago.  This was treated with amoxicillin antibiotic therapy for 7 days with resolution of facial swelling and symptoms. Patient described having intermittent dull, achy pain that hurt on and off at that time. Patient indicates that the pain reached an intensity of 6 out of 10 but is currently 0 out of 10.  Patient last saw a dentist approximately one year ago in [place], [place]. Patient had dental examination and recementation of a crown along with discussion of overall treatment plan for extraction of remaining teeth alveoloplasty and subsequent fabrication of upper and lower complete dentures after adequate healing.  The patient denies having any partial dentures. The patient denies any overt dental phobia but does have a history of anxiety.  PROBLEM LIST: Patient Active Problem List   Diagnosis Date Noted  . Peripheral polyneuropathy 04/14/2017  . COPD mixed type ([place]) 02/23/2017  . Chronic sinusitis 01/27/2017  . Splenomegaly, congestive, chronic 12/29/2016  . Diarrhea 12/01/2016  . Poor dentition 12/01/2016  . Benign mole 12/01/2016  . Pancytopenia, acquired ([place])  11/03/2016  . Odynophagia 01/15/2016  . Polycythemia, secondary 06/26/2015  . Quality of life palliative care encounter 06/26/2015  . Atherosclerosis of extremity with intermittent claudication ([place]) 01/23/2015  . Lateral epicondylitis of left elbow   . ECRB (extensor carpi radialis brevis) tenosynovitis   . Anxiety 01/30/2014  . Preoperative clearance 01/30/2014  . Thrombocytopenia ([place]) 01/30/2014  . DM (diabetes mellitus) type II controlled peripheral vascular disorder ([place]) 08/02/2013  . Hypogammaglobulinemia, acquired ([place]) 12/26/2012  . Hereditary and idiopathic peripheral neuropathy 08/20/2012  . 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 CAD (coronary artery disease) 07/29/2011  . 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 ([place]) 03/18/2011  . MI, acute, non ST segment elevation ([place])   . Hyperlipidemia   . PVD (peripheral vascular disease) ([place])   . GERD 09/27/2008  . SHOULDER STRAIN, RIGHT 09/27/2008    PMH: Past Medical History:  Diagnosis Date  . Anxiety 01/30/2014  . Back injury    lower disc  . CAD (coronary artery disease)   . CLL (chronic lymphocytic leukemia) ([place]) 03/18/2011  . Diabetes mellitus ([place])    Type 2   . ECRB (extensor carpi radialis brevis) tenosynovitis   . GERD (gastroesophageal reflux disease)    takes Nexium if needed  . Hyperlipidemia   . Lateral epicondylitis of left elbow   . MI, acute, non ST segment elevation ([place]) 06/28/2009   with stenting of the LAD  . Neuromuscular disorder ([place])    peripheral neuropathy  . PVD (peripheral vascular disease) ([place])   . Tobacco abuse     PSH: Past Surgical History:  Procedure Laterality Date  . ADENOIDECTOMY  1955  . CARDIAC CATHETERIZATION    . CARDIAC CATHETERIZATION N/A 09/26/2014   Procedure: Left Heart Cath and Coronary Angiography;  Surgeon: Peter M Martinique, MD;  Location: [facility];  Service: Cardiovascular;  Laterality: N/A;  . carpel tunnel release  Left 04-1989  . carpel tunnel release  Right 01-1989  . CHOLECYSTECTOMY  2007  . CORONARY STENT PLACEMENT  May 2011  . femoral stents    . LATERAL EPICONDYLE RELEASE Left 02/12/2014   Procedure: LEFT ELBOW DEBRIDEMENT WITH TENDON REPAIR ;  Surgeon: Lorn Junes, MD;  Location: [facility];  Service: Orthopedics;  Laterality: Left;  . LEFT CAI STENT/PTA AND POPLITEAL ARTERY/TIBIAL THROMBECTOMY     . LEFT HEART CATHETERIZATION WITH CORONARY ANGIOGRAM N/A 08/26/2011   Procedure: LEFT HEART CATHETERIZATION WITH CORONARY ANGIOGRAM;  Surgeon: Peter M Martinique, MD;  Location: [facility];  Service: Cardiovascular;  Laterality: N/A;  . PERIPHERAL VASCULAR CATHETERIZATION N/A 01/01/2015   Procedure: Abdominal Aortogram;  Surgeon: Conrad Hornick, MD;  Location: [facility];  Service: Cardiovascular;  Laterality: N/A;  . TARSAL TUNNEL RELEASE Bilateral 08-2007    ALLERGIES: Allergies  Allergen Reactions  . Codeine Hives    Pt states he can take a few, more reaction with extended doses.    MEDICATIONS: Current Outpatient Medications  Medication Sig Dispense Refill  . acetaminophen (TYLENOL) 500 MG tablet Take 500 mg by mouth every 6 (six) hours as needed for mild pain, moderate pain or headache.    Marland Kitchen acyclovir (ZOVIRAX) 400 MG tablet TAKE 1 TABLET BY MOUTH TWICE A DAY 180 tablet 0  . albuterol (PROVENTIL HFA;VENTOLIN HFA) 108 (90 Base) MCG/ACT inhaler Inhale 2 puffs into the lungs every 6 (six) hours as needed for wheezing or shortness of breath. 1 Inhaler 2  . ALPRAZolam (XANAX) 0.25 MG tablet Take 0.25 mg by mouth as needed for anxiety.    Marland Kitchen aspirin EC 81 MG tablet Take 1 tablet (81 mg total) by mouth daily.    . Capsaicin 0.1 % CREA Apply topically. OTC - use as needed for peripheral neuropathy.    . cyanocobalamin (,VITAMIN B-12,) 1000 MCG/ML injection Inject 1 mL (1,000 mcg total) into the muscle every 30 (thirty) days. 10 mL 1  . esomeprazole (NEXIUM) 40 MG capsule Take 40 mg by mouth 2  (two) times daily.  0  . fluticasone (FLONASE) 50 MCG/ACT nasal spray Place into the nose.    Marland Kitchen HYDROcodone-acetaminophen (NORCO) 10-325 MG tablet take 1 tablet by mouth every 6 to 8 hours if needed for pain  0  . lidocaine (LIDODERM) 5 % apply patch to affected area once daily as directed for lower back pain  0  . Lidocaine 4 % GEL Apply topically. OTC - use as needed for peripheral neuropathy.    . meclizine (ANTIVERT) 12.5 MG tablet Take 12.5 mg by mouth every 6 (six) hours as needed for dizziness.   0  . metFORMIN (GLUCOPHAGE-XR) 500 MG 24 hr tablet Take by mouth. Takes one tablet in AM and 2 tablets at night.    . metoprolol succinate (TOPROL-XL) 25 MG 24 hr tablet TAKE 1 TABLET (25 MG TOTAL) BY MOUTH DAILY. 90 tablet 3  . nitroGLYCERIN (NITROSTAT) 0.4 MG SL tablet PLACE 1 TABLET UNDER THE TONGUE EVERY 5 MINUTES X 3 DOSES AS NEEDED FOR CHEST PAIN *MAX 3 DOSES* 25 tablet 5  . nortriptyline (PAMELOR) 25 MG capsule Take 4 capsules (100  mg total) by mouth at bedtime. 120 capsule 11  . pregabalin (LYRICA) 200 MG capsule 1 cap three times daily and 2 caps at hs 270 capsule 4  . rosuvastatin (CRESTOR) 5 MG tablet TAKE 1 TABLET (5 MG TOTAL) BY MOUTH DAILY. 90 tablet 3  . sildenafil (VIAGRA) 100 MG tablet Take 0.5-1 tablets (50-100 mg total) by mouth daily as needed for erectile dysfunction (30 minutes prior to sexual intercourse). 10 tablet 6  . tetrahydrozoline 0.05 % ophthalmic solution Place 1 drop into both eyes as needed. Red eyes    . zolpidem (AMBIEN) 10 MG tablet Take 10 mg by mouth at bedtime as needed for sleep.   0   No current facility-administered medications for this visit.    Facility-Administered Medications Ordered in Other Visits  Medication Dose Route Frequency Provider Last Rate Last Dose  . 0.9 %  sodium chloride infusion   Intravenous Continuous Alvy Bimler, Ni, MD 50 mL/hr at 03/07/14 1005    . sodium chloride 0.9 % injection 10 mL  10 mL Intracatheter PRN Marcy Panning, MD   10  mL at 08/22/12 1721    LABS: Lab Results  Component Value Date   WBC 3.1 (L) 04/21/2017   HGB 13.0 04/21/2017   HCT 38.7 04/21/2017   MCV 97.5 04/21/2017   PLT 86 (L) 04/21/2017      Component Value Date/Time   NA 140 04/21/2017 1432   NA 136 01/26/2017 0940   K 4.2 04/21/2017 1432   K 4.0 01/26/2017 0940   CL 106 04/21/2017 1432   CL 104 08/03/2012 1229   CO2 26 04/21/2017 1432   CO2 23 01/26/2017 0940   GLUCOSE 106 04/21/2017 1432   GLUCOSE 146 (H) 01/26/2017 0940   GLUCOSE 223 (H) 08/03/2012 1229   BUN 9 04/21/2017 1432   BUN 9.6 01/26/2017 0940   CREATININE 0.89 04/21/2017 1432   CREATININE 1.0 01/26/2017 0940   CALCIUM 9.2 04/21/2017 1432   CALCIUM 8.7 01/26/2017 0940   GFRNONAA >60 04/21/2017 1432   GFRAA >60 04/21/2017 1432   Lab Results  Component Value Date   INR 1.0 09/22/2014   INR 1.00 02/03/2014   INR 1.16 06/01/2012   No results found for: PTT  SOCIAL HISTORY: Social History   Socioeconomic History  . Marital status: Married    Spouse name: [name]  . Number of children: 1  . Years of education: College  . Highest education level: Not on file  Social Needs  . Financial resource strain: Not on file  . Food insecurity - worry: Not on file  . Food insecurity - inability: Not on file  . Transportation needs - medical: Not on file  . Transportation needs - non-medical: Not on file  Occupational History    Employer: [employer]    Comment: [employer]  Tobacco Use  . Smoking status: Former Smoker    Packs/day: 0.50    Years: 44.00    Pack years: 22.00    Types: Cigarettes    Last attempt to quit: 04/23/2016    Years since quitting: 1.0  . Smokeless tobacco: Former Systems developer  . Tobacco comment: but does have ocassional relapses  Substance and Sexual Activity  . Alcohol use: No    Alcohol/week: 0.0 oz    Comment:  drinks non-alcoholic beer  . Drug use: No  . Sexual activity: Yes  Other Topics Concern  . Not on file  Social History  Narrative   Patient lives at home  with wife.   Caffeine Use: 15 cups of caffeine weekly    FAMILY HISTORY: Family History  Problem Relation Age of Onset  . Lung cancer Mother 58  . Cancer Mother        lung  . Heart failure Father 81  . Heart disease Father     REVIEW OF SYSTEMS: Reviewed with the patient as per History of present illness. Psych: Patient denies having any significant dental phobia but does have a history of anxiety.  DENTAL HISTORY: CHIEF COMPLAINT: Patient referred by Dr. Alvy Bimler for a dental consultation.  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  Patient was referred by Dr. Alvy Bimler for evaluation of poor dentition.  Patient now seen as part of a medically necessary dental consultation to rule out dental infection that may affect the patient's systemic health.  The patient currently denies acute toothaches, swellings, or abscesses. Patient did have an abscess involving the upper right quadrant 3-4 weeks ago.  This was treated with amoxicillin antibiotic therapy for 7 days with resolution of facial swelling and symptoms. Patient described having intermittent dull, achy pain that hurt on and off at that time. Patient indicates that the pain reached an intensity of 6 out of 10 but is currently 0 out of 10.  Patient last saw a dentist approximately one year ago in [place], [place]. Patient had dental examination and recementation of a crown along with discussion of overall treatment plan for extraction of remaining teeth alveoloplasty and subsequent fabrication of upper and lower complete dentures after adequate healing.  The patient denies having any partial dentures. The patient denies any overt dental phobia but does have a history of anxiety.  DENTAL EXAMINATION: GENERAL:  The patient is a well-developed, well-nourished male in no acute  distress. HEAD AND NECK:  There is no significant neck lymphadenopathy. The patient denies acute TMJ symptoms. INTRAORAL EXAM:  The patient has xerostomia.  There is no evidence of current abscess formation. DENTITION:  The patient has multiple missing teeth numbers 1-4, 13-16, 17,18, 21, and 30-32.  Patient has retained root segment in the area of tooth numbers 5, 7, 8, 10, 19, and 29.  PERIODONTAL:  The patient has chronic periodontitis with plaque and calculus accumulations, gingival recession, and tooth mobility.There is moderate bone loss noted. DENTAL CARIES/SUBOPTIMAL RESTORATIONS:  There are rampant dental caries affecting almost all remaining teeth. ENDODONTIC:  The patient currently denies acute pulpitis symptoms. The patient has a history of acute acute pulpitis symptoms and abscess approximately 3-4 weeks ago involving the upper right quadrant area #5.  There are multiple areas of periapical pathology and radiolucency. The patient has previous root canal therapies associated with tooth numbers 11 and 20. CROWN AND BRIDGE:  Patient has a history of multiple crown restorations some of which are suboptimal at this time. PROSTHODONTIC: The patient denies having any partial dentures. OCCLUSION:  The patient has a poor occlusal scheme secondary to multiple missing teeth, multiple retained root segments, deep overbite, and lack of replacement of missing teeth with dental prostheses.  RADIOGRAPHIC INTERPRETATION: An orthopantogram was taken and supplemented with 9 periapical radiographs. There are multiple missing teeth. There are multiple retained root segments. There are multiple areas of periapical pathology and radiolucency. Rampant dental caries are noted. There is moderate bone loss noted. There is supra-eruption and drifting of the unopposed teeth into the edentulous areas.  Tooth numbers 11 and 20 have previous root canal therapies. Multiple crown restorations are noted. Multiple suboptimal  dental restorations are noted. Bilateral maxillary sinuses are noted and appear to be opacified.   ASSESSMENTS: 1. Small cell B cell lymphoma of the lymph nodes 2. History of pancytopenia and current thrombocytopenia 3. History of acute pulpitis 4. Chronic apical periodontitis 5. Rampant dental caries 6. Multiple retained root segments 7. Chronic periodontitis with bone loss 8. Gingival recession 9. Accretions 10. Loose teeth 11. Multiple missing teeth 12.Deep overbite 13. Poor occlusal scheme and malocclusion 14. Xerostomia 15. Potential risk for complications with anticipated invasive dental procedures in the operative room with general anesthesia secondary to cardiovascular compromise. 16. Risk for bleeding with invasive dental procedures due to thrombocytopenia  PLAN/RECOMMENDATIONS: 1. I discussed the risks, benefits, and complications of various treatment options with the patient in relationship to his medical and dental conditions and risk for dental infection secondary to his immunocompromise state. We discussed various treatment options to include no treatment, multiple extractions with alveoloplasty, pre-prosthetic surgery as indicated, periodontal therapy, dental restorations, root canal therapy, crown and bridge therapy, implant therapy, and replacement of missing teeth as indicated. The patient currently wishes to proceed with extraction of remaining teeth with alveoloplasty in the operating room with general anesthesia.  Patient will then follow-up with a dentist of his choice for fabrication of upper and lower complete dentures after adequate healing. We discussed referral to an oral surgeon at this time, but the patient wished to proceed with treatment in the operating room with general anesthesia with Dental Medicine.  Patient will need to obtain cardiac clearance from his cardiologist prior to scheduling the operating room procedure. Patient has a tentative appointment with  Truitt Merle, NP on 05/22/2017.  I will also need to discuss possible platelet transfusion prior to invasive dental procedures as well as adjustment of IVIG therapy with Dr. Alvy Bimler.   2. Discussion of findings with medical team and coordination of future medical and dental care as needed.  I spent in excess of  120 minutes during the conduct of this consultation and >50% of this time involved direct face-to-face encounter for counseling and/or coordination of the patient's care.    Lenn Cal, DDS

## 2017-04-28 ENCOUNTER — Telehealth: Payer: Self-pay | Admitting: Nurse Practitioner

## 2017-04-28 NOTE — Telephone Encounter (Signed)
New message    Seen in 10/18     [facility] Pre-operative Risk Assessment    Request for surgical clearance:  1. What type of surgery is being performed? Full mouth extraction   2. When is this surgery scheduled? Not scheduled yet   3. What type of clearance is required (medical clearance vs. Pharmacy clearance to hold med vs. Both)? Medical clearance only  4. Are there any medications that need to be held prior to surgery and how long? None   5. Practice name and name of physician performing surgery? Dr Enrique Sack  6. What is your office phone and fax number? [phone] fax [phone]  7. Anesthesia type (None, local, MAC, general) ? General    Howie Ill 04/28/2017, 9:38 AM  _________________________________________________________________   (provider comments below)

## 2017-05-01 NOTE — Telephone Encounter (Signed)
Reviewed note below. She is to be seen in April and can be cleared then if she decides to have procedure.

## 2017-05-01 NOTE — Telephone Encounter (Signed)
S/w pt does not need to move up appointment for future dental pre-op, pt has not decided when pt is going to have surgery.  Pt will keep appt with Ryan Copeland in April.

## 2017-05-05 ENCOUNTER — Ambulatory Visit (INDEPENDENT_AMBULATORY_CARE_PROVIDER_SITE_OTHER): Payer: Medicare Other | Admitting: Neurology

## 2017-05-05 DIAGNOSIS — G629 Polyneuropathy, unspecified: Secondary | ICD-10-CM

## 2017-05-05 DIAGNOSIS — I2510 Atherosclerotic heart disease of native coronary artery without angina pectoris: Secondary | ICD-10-CM

## 2017-05-05 NOTE — Progress Notes (Signed)
[facility]  PATIENT: Ryan Copeland DOB: 1951/01/31  HISTORY OF PRESENT ILLNESS:  [name] has been followed by our clinic for a long time for peripheral neuropathy, previously he was a patient of Dr. Doy Copeland, since he left the practice, he was followed by nurse practitioner Ryan Copeland.  I reviewed his oncologist note from Dr. 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, chronic lymphocytic leukemia, no evidence of solid organ involvement, he received chemotherapy FCR x6 cycles from May 2014 to October 2014, chemotherapy Bendamustine and and rituximab from October 06, 2016 to February 24, 2017,  Most recent CAT scan on March 22, 8544, no hypermetabolic adenopathy identified within the neck, chest, abdomen or pelvic, prominent left retroperitoneal lymph node measuring 1.6 cm without significant FDG uptake, splenomegaly,  He has diabetes since 2012, still works as an Chief Financial Officer,  Is diabetic symptoms started around 2009, he noticed bilateral feet numbness tingling, at the bottom of his feet, actually underwent bilateral tarsal tunnel release surgery did not help his symptoms, reported abnormal EMG nerve conduction study in the past from outside office, never had a repeat study in the laboratory,  Reviewing the record, previously had evidence of vitamin B12 deficiency, has been on vitamin B12 IM supplement since,  He has tried and failed gabapentin, Cymbalta, Topamax, currently taking Lyrica 100 mg 3 times a day +200 mg at bedtime, and gabapentin 300 mg twice a day.  Laboratory evaluations in April 2018, triglyceride 201, LDL 79, A1c was 7.2  UPDATE May 05 2017: Laboratory evaluations on April 13, 2017 showed low vitamin D 21.5, otherwise normal or negative vitamin B12, RPR, ANA, Lyme titer, CMP, mild decreased total protein, CBC, decreased WBC 3.1,  A1c is 6.2, immune O fixative protein electrophoresis showed decreased IgA, IgM,  IgG,  He is taking Lyrica 100 mg 3 times a day, start taking nortriptyline 25 mg 4 tablets every night, which has helped him sleep, but complains of sleepiness drowsiness next morning,  Electrodiagnostic study today showed evidence of mild to moderate peripheral neuropathy  REVIEW OF SYSTEMS: Full 14 system review of systems performed and notable only for those listed, all others are neg:     ALLERGIES: Allergies  Allergen Reactions  . Codeine Hives    Pt states he can take a few, more reaction with extended doses.    HOME MEDICATIONS: Outpatient Medications Prior to Visit  Medication Sig Dispense Refill  . acetaminophen (TYLENOL) 500 MG tablet Take 500 mg by mouth every 6 (six) hours as needed for mild pain, moderate pain or headache.    Marland Kitchen acyclovir (ZOVIRAX) 400 MG tablet TAKE 1 TABLET BY MOUTH TWICE A DAY 180 tablet 0  . albuterol (PROVENTIL HFA;VENTOLIN HFA) 108 (90 Base) MCG/ACT inhaler Inhale 2 puffs into the lungs every 6 (six) hours as needed for wheezing or shortness of breath. 1 Inhaler 2  . ALPRAZolam (XANAX) 0.25 MG tablet Take 0.25 mg by mouth as needed for anxiety.    Marland Kitchen aspirin EC 81 MG tablet Take 1 tablet (81 mg total) by mouth daily.    . Capsaicin 0.1 % CREA Apply topically. OTC - use as needed for peripheral neuropathy.    . cyanocobalamin (,VITAMIN B-12,) 1000 MCG/ML injection Inject 1 mL (1,000 mcg total) into the muscle every 30 (thirty) days. 10 mL 1  . esomeprazole (NEXIUM) 40 MG capsule Take 40 mg by mouth 2 (two) times daily.  0  . fluticasone (FLONASE) 50 MCG/ACT  nasal spray Place into the nose.    Marland Kitchen HYDROcodone-acetaminophen (NORCO) 10-325 MG tablet take 1 tablet by mouth every 6 to 8 hours if needed for pain  0  . lidocaine (LIDODERM) 5 % apply patch to affected area once daily as directed for lower back pain  0  . Lidocaine 4 % GEL Apply topically. OTC - use as needed for peripheral neuropathy.    . meclizine (ANTIVERT) 12.5 MG tablet Take 12.5 mg by mouth  every 6 (six) hours as needed for dizziness.   0  . metFORMIN (GLUCOPHAGE-XR) 500 MG 24 hr tablet Take by mouth. Takes one tablet in AM and 2 tablets at night.    . metoprolol succinate (TOPROL-XL) 25 MG 24 hr tablet TAKE 1 TABLET (25 MG TOTAL) BY MOUTH DAILY. 90 tablet 3  . nitroGLYCERIN (NITROSTAT) 0.4 MG SL tablet PLACE 1 TABLET UNDER THE TONGUE EVERY 5 MINUTES X 3 DOSES AS NEEDED FOR CHEST PAIN *MAX 3 DOSES* 25 tablet 5  . nortriptyline (PAMELOR) 25 MG capsule Take 4 capsules (100 mg total) by mouth at bedtime. 120 capsule 11  . pregabalin (LYRICA) 200 MG capsule 1 cap three times daily and 2 caps at hs 270 capsule 4  . rosuvastatin (CRESTOR) 5 MG tablet TAKE 1 TABLET (5 MG TOTAL) BY MOUTH DAILY. 90 tablet 3  . sildenafil (VIAGRA) 100 MG tablet Take 0.5-1 tablets (50-100 mg total) by mouth daily as needed for erectile dysfunction (30 minutes prior to sexual intercourse). 10 tablet 6  . tetrahydrozoline 0.05 % ophthalmic solution Place 1 drop into both eyes as needed. Red eyes    . zolpidem (AMBIEN) 10 MG tablet Take 10 mg by mouth at bedtime as needed for sleep.   0   Facility-Administered Medications Prior to Visit  Medication Dose Route Frequency Provider Last Rate Last Dose  . 0.9 %  sodium chloride infusion   Intravenous Continuous Ryan Copeland, Ni, MD 50 mL/hr at 03/07/14 1005    . sodium chloride 0.9 % injection 10 mL  10 mL Intracatheter PRN Marcy Panning, MD   10 mL at 08/22/12 1721    PAST MEDICAL HISTORY: Past Medical History:  Diagnosis Date  . Anxiety 01/30/2014  . Back injury    lower disc  . CAD (coronary artery disease)   . CLL (chronic lymphocytic leukemia) ([place]) 03/18/2011  . Diabetes mellitus ([place])    Type 2   . ECRB (extensor carpi radialis brevis) tenosynovitis   . GERD (gastroesophageal reflux disease)    takes Nexium if needed  . Hyperlipidemia   . Lateral epicondylitis of left elbow   . MI, acute, non ST segment elevation ([place]) 06/28/2009   with stenting of the LAD   . Neuromuscular disorder ([place])    peripheral neuropathy  . PVD (peripheral vascular disease) ([place])   . Tobacco abuse     PAST SURGICAL HISTORY: Past Surgical History:  Procedure Laterality Date  . ADENOIDECTOMY  1955  . CARDIAC CATHETERIZATION    . CARDIAC CATHETERIZATION N/A 09/26/2014   Procedure: Left Heart Cath and Coronary Angiography;  Surgeon: Peter M Martinique, MD;  Location: [place] [facility];  Service: Cardiovascular;  Laterality: N/A;  . carpel tunnel release Left 04-1989  . carpel tunnel release  Right 01-1989  . CHOLECYSTECTOMY  2007  . CORONARY STENT PLACEMENT  May 2011  . femoral stents    . LATERAL EPICONDYLE RELEASE Left 02/12/2014   Procedure: LEFT ELBOW DEBRIDEMENT WITH TENDON REPAIR ;  Surgeon: Herbie Baltimore  Venetia Maxon, MD;  Location: [place] [facility];  Service: Orthopedics;  Laterality: Left;  . LEFT CAI STENT/PTA AND POPLITEAL ARTERY/TIBIAL THROMBECTOMY     . LEFT HEART CATHETERIZATION WITH CORONARY ANGIOGRAM N/A 08/26/2011   Procedure: LEFT HEART CATHETERIZATION WITH CORONARY ANGIOGRAM;  Surgeon: Peter M Martinique, MD;  Location: [facility];  Service: Cardiovascular;  Laterality: N/A;  . PERIPHERAL VASCULAR CATHETERIZATION N/A 01/01/2015   Procedure: Abdominal Aortogram;  Surgeon: Conrad Olivette, MD;  Location: [place] [facility];  Service: Cardiovascular;  Laterality: N/A;  . TARSAL TUNNEL RELEASE Bilateral 08-2007    FAMILY HISTORY: Family History  Problem Relation Age of Onset  . Lung cancer Mother 39  . Cancer Mother        lung  . Heart failure Father 83  . Heart disease Father     SOCIAL HISTORY: Social History   Socioeconomic History  . Marital status: Married    Spouse name: [name]  . Number of children: 1  . Years of education: College  . Highest education level: Not on file  Occupational History    Employer: [employer]    Comment: [employer]  Social Needs  . Financial resource strain: Not on file  . Food insecurity:    Worry: Not on file     Inability: Not on file  . Transportation needs:    Medical: Not on file    Non-medical: Not on file  Tobacco Use  . Smoking status: Former Smoker    Packs/day: 0.50    Years: 44.00    Pack years: 22.00    Types: Cigarettes    Last attempt to quit: 04/23/2016    Years since quitting: 1.0  . Smokeless tobacco: Former Systems developer  . Tobacco comment: but does have ocassional relapses  Substance and Sexual Activity  . Alcohol use: No    Alcohol/week: 0.0 oz    Comment:  drinks non-alcoholic beer  . Drug use: No  . Sexual activity: Yes  Lifestyle  . Physical activity:    Days per week: Not on file    Minutes per session: Not on file  . Stress: Not on file  Relationships  . Social connections:    Talks on phone: Not on file    Gets together: Not on file    Attends religious service: Not on file    Active member of club or organization: Not on file    Attends meetings of clubs or organizations: Not on file    Relationship status: Not on file  . Intimate partner violence:    Fear of current or ex partner: Not on file    Emotionally abused: Not on file    Physically abused: Not on file    Forced sexual activity: Not on file  Other Topics Concern  . Not on file  Social History Narrative   Patient lives at home with wife.   Caffeine Use: 15 cups of caffeine weekly     PHYSICAL EXAM  There were no vitals filed for this visit. There is no height or weight on file to calculate BMI.   PHYSICAL EXAMNIATION:  Gen: NAD, conversant, well nourised, obese, well groomed                     Cardiovascular: Regular rate rhythm, no peripheral edema, warm, nontender. Eyes: Conjunctivae clear without exudates or hemorrhage Neck: Supple, no carotid bruits. Pulmonary: Clear to auscultation bilaterally   NEUROLOGICAL EXAM:  MENTAL STATUS: Speech:  Speech is normal; fluent and spontaneous with normal comprehension.  Cognition:     Orientation to time, place and person     Normal recent  and remote memory     Normal Attention span and concentration     Normal Language, naming, repeating,spontaneous speech     Fund of knowledge   CRANIAL NERVES: CN II: Visual fields are full to confrontation. Fundoscopic exam is normal with sharp discs and no vascular changes. Pupils are round equal and briskly reactive to light. CN III, IV, VI: extraocular movement are normal. No ptosis. CN V: Facial sensation is intact to pinprick in all 3 divisions bilaterally. Corneal responses are intact.  CN VII: Face is symmetric with normal eye closure and smile. CN VIII: Hearing is normal to rubbing fingers CN IX, X: Palate elevates symmetrically. Phonation is normal. CN XI: Head turning and shoulder shrug are intact CN XII: Tongue is midline with normal movements and no atrophy.  MOTOR: There is no pronator drift of out-stretched arms. Muscle bulk and tone are normal. Muscle strength is normal.  REFLEXES: Reflexes are 2+ and symmetric at the biceps, triceps, knees, and absent ankles. Plantar responses are flexor.  SENSORY: Dependent decreased to light touch, pinprick, to above ankle level  COORDINATION: Rapid alternating movements and fine finger movements are intact. There is no dysmetria on finger-to-nose and heel-knee-shin.    GAIT/STANCE: Steady gait normal posturing    DIAGNOSTIC DATA (LABS, IMAGING, TESTING) - I reviewed patient records, labs, notes, testing and imaging myself where available.  Lab Results  Component Value Date   WBC 3.1 (L) 04/21/2017   HGB 13.0 04/21/2017   HCT 38.7 04/21/2017   MCV 97.5 04/21/2017   PLT 86 (L) 04/21/2017      Component Value Date/Time   NA 140 04/21/2017 1432   NA 136 01/26/2017 0940   K 4.2 04/21/2017 1432   K 4.0 01/26/2017 0940   CL 106 04/21/2017 1432   CL 104 08/03/2012 1229   CO2 26 04/21/2017 1432   CO2 23 01/26/2017 0940   GLUCOSE 106 04/21/2017 1432   GLUCOSE 146 (H) 01/26/2017 0940   GLUCOSE 223 (H) 08/03/2012 1229    BUN 9 04/21/2017 1432   BUN 9.6 01/26/2017 0940   CREATININE 0.89 04/21/2017 1432   CREATININE 1.0 01/26/2017 0940   CALCIUM 9.2 04/21/2017 1432   CALCIUM 8.7 01/26/2017 0940   PROT 5.7 (L) 04/21/2017 1432   PROT 5.9 (L) 04/13/2017 0823   PROT 5.7 (L) 01/26/2017 0940   ALBUMIN 3.6 04/21/2017 1432   ALBUMIN 3.6 01/26/2017 0940   AST 12 04/21/2017 1432   AST 13 01/26/2017 0940   ALT 13 04/21/2017 1432   ALT 12 01/26/2017 0940   ALKPHOS 104 04/21/2017 1432   ALKPHOS 78 01/26/2017 0940   BILITOT 0.4 04/21/2017 1432   BILITOT 0.64 01/26/2017 0940   GFRNONAA >60 04/21/2017 1432   GFRAA >60 04/21/2017 1432   Lab Results  Component Value Date   CHOL 146 05/24/2016   HDL 27 (L) 05/24/2016   LDLCALC 79 05/24/2016   LDLDIRECT 117.6 08/22/2011   TRIG 201 (H) 05/24/2016   CHOLHDL 5.4 (H) 05/24/2016   Lab Results  Component Value Date   HGBA1C 6.2 (H) 04/13/2017    ASSESSMENT AND PLAN 67 y.o. 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    Return to clinic in 6 months     Marcial Pacas, M.D. Ph.D.  [facility] [place], [place] [address] Phone: [phone] Fax:      [phone]

## 2017-05-05 NOTE — Procedures (Signed)
Full Name: Ryan Copeland Gender: Male MRN #: [id number] Date of Birth: 08-31-2050    Visit Date: 05/05/17 08:18 Age: 67 Years 0 Months Old Examining Physician: Marcial Pacas, MD  Referring Physician: Krista Blue, MD History: 67 year old male presented with gradual onset bilateral toes paresthesia burning pain, history of diabetes, chemotherapy for B-cell lymphoma  Summary of the tests:  Nerve conduction study:  Bilateral wrists sural, left superficial peroneal sensory responses were absent.  Bilateral tibial, peroneal to EDB motor response was absent.  Right ulnar and radial sensory responses showed evidence of moderately prolonged peak latency, with moderate to severely decreased the snap amplitude.  Right ulnar motor responses showed mildly prolonged distal latency, with mildly slow conduction velocity, well-preserved C map amplitude.  Electromyography: Selective needle examinations were performed at bilateral lower extremity muscles and bilateral lumbosacral paraspinal muscles.  There is evidence of mild chronic neuropathic changes at bilateral lower extremity muscles, there is no evidence of active denervation at bilateral lumbosacral paraspinal muscles.   Conclusion:  This is an abnormal study.  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   ------------------------------- Marcial Pacas, M.D.  [facility] [place], [place] [address] Tel: [phone] Fax: [phone]        [facility]    Nerve / Sites Muscle Latency Ref. Amplitude Ref. Rel Amp Segments Distance Velocity Ref. Area    ms ms mV mV %  cm m/s m/s mVms  R Ulnar - ADM     Wrist ADM 3.6 ?3.3 10.3 ?6.0 100 Wrist - ADM 7   29.8     B.Elbow ADM 8.2  9.3  90.5 B.Elbow - Wrist 21 45 ?49 28.6     A.Elbow ADM 10.9  8.2  88.3 A.Elbow - B.Elbow 12 44 ?49 27.8         A.Elbow - Wrist      R Peroneal - EDB     Ankle EDB NR ?6.5 NR ?2.0 NR  Ankle - EDB 9   NR         Pop fossa - Ankle      L Peroneal - EDB     Ankle EDB NR ?6.5 NR ?2.0 NR Ankle - EDB 9   NR         Pop fossa - Ankle      R Tibial - AH     Ankle AH NR ?5.8 NR ?4.0 NR Ankle - AH 9   NR  L Tibial - AH     Ankle AH NR ?5.8 NR ?4.0 NR Ankle - AH 9   NR               SNC    Nerve / Sites Rec. Site Peak Lat Ref.  Amp Ref. Segments Distance    ms ms V V  cm  R Radial - Anatomical snuff box (Forearm)     Forearm Wrist 3.2 ?2.9 5 ?15 Forearm - Wrist 10  R Sural - Ankle (Calf)     Calf Ankle NR ?4.4 NR ?6 Calf - Ankle 14  L Sural - Ankle (Calf)     Calf Ankle NR ?4.4 NR ?6 Calf - Ankle 14  L Superficial peroneal - Ankle     Lat leg Ankle NR ?4.4 NR ?6 Lat leg - Ankle 14  R Ulnar - Orthodromic, (Dig V, Mid palm)     Dig V Wrist 3.7 ?3.1 2 ?5  Dig V - Wrist 11                 F  Wave    Nerve F Lat Ref.   ms ms  R Ulnar - ADM 36.5 ?32.0       EMG full       EMG Summary Table    Spontaneous MUAP Recruitment  Muscle IA Fib PSW Fasc Other Amp Dur. Poly Pattern  R. Tibialis anterior Increased None None None _______ Increased Increased Normal Reduced  R. Tibialis posterior Increased None None None _______ Increased Increased Normal Reduced  R. Peroneus longus Increased 1+ None None _______ Increased Normal Normal Reduced  R. Gastrocnemius (Medial head) Increased None None None _______ Normal Increased 1+ Reduced  R. Vastus lateralis Normal None None None _______ Normal Normal Normal Normal  R. Abductor hallucis Increased None None None _______ Increased Increased Normal Reduced  L. Tibialis anterior Increased None None None _______ Normal Normal Normal Reduced  L. Tibialis posterior Increased None None None _______ Increased Increased 1+ Reduced  L. Peroneus longus Increased None None None _______ Increased Increased Normal Reduced  L. Gastrocnemius (Medial head) Increased None None None _______ Increased Normal Normal Reduced  L. Vastus lateralis Normal  None None None _______ Normal Normal Normal Normal  L. Biceps femoris (long head) Normal None None None _______ Normal Normal Normal Normal  R. Lumbar paraspinals (mid) Normal None None None _______ Normal Normal Normal Normal  R. Lumbar paraspinals (low) Normal None None None _______ Normal Normal Normal Normal  L. Lumbar paraspinals (mid) Normal None None None _______ Normal Normal Normal Normal  L. Lumbar paraspinals (low) Normal None None None _______ Normal Normal Normal Normal

## 2017-05-09 ENCOUNTER — Telehealth: Payer: Self-pay | Admitting: Hematology and Oncology

## 2017-05-09 NOTE — Telephone Encounter (Signed)
Mailed patient calendar of upcoming June appointments per 3/25 sch message

## 2017-05-10 ENCOUNTER — Encounter: Payer: Self-pay | Admitting: Hematology and Oncology

## 2017-05-10 ENCOUNTER — Encounter (HOSPITAL_COMMUNITY): Payer: Self-pay | Admitting: Dentistry

## 2017-05-22 ENCOUNTER — Encounter: Payer: Self-pay | Admitting: Nurse Practitioner

## 2017-05-22 ENCOUNTER — Telehealth (HOSPITAL_COMMUNITY): Payer: Self-pay | Admitting: Dentistry

## 2017-05-22 ENCOUNTER — Ambulatory Visit (INDEPENDENT_AMBULATORY_CARE_PROVIDER_SITE_OTHER): Payer: BLUE CROSS/BLUE SHIELD | Admitting: Nurse Practitioner

## 2017-05-22 VITALS — BP 128/70 | HR 68 | Ht 72.0 in | Wt 216.8 lb

## 2017-05-22 DIAGNOSIS — I259 Chronic ischemic heart disease, unspecified: Secondary | ICD-10-CM

## 2017-05-22 DIAGNOSIS — Z01818 Encounter for other preprocedural examination: Secondary | ICD-10-CM

## 2017-05-22 DIAGNOSIS — I10 Essential (primary) hypertension: Secondary | ICD-10-CM | POA: Diagnosis not present

## 2017-05-22 DIAGNOSIS — E7800 Pure hypercholesterolemia, unspecified: Secondary | ICD-10-CM

## 2017-05-22 NOTE — Progress Notes (Signed)
CARDIOLOGY OFFICE NOTE  Date:  05/22/2017    Ryan Copeland Date of Birth: June 06, 1950 Medical Record #[id number]  PCP:  [name]., PA-C  Cardiologist:  Servando Snare Nahser  Chief Complaint  Patient presents with  . Coronary Artery Disease  . Pre-op Exam    6 month check. Seen for Dr. Acie Fredrickson    History of Present Illness: Ryan Copeland is a 67 y.o. male who presents today for a 6 month/pre op check. Seen for Dr. Acie Fredrickson. Former patient of Dr. Susa Simmonds.   Has known prior NSTEMI and stenting of the LAD in 2011. Underwent repeat cath due to refractory symptoms back in August of 2016 - managed medically. 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 He sees neurology for his peripheral neuropathy.   Seen back in Septemberof 2017 - had gained weight. Some intermittent chest heaviness - no response with NTG but I added Imdur to his regimen. When seen back in October - still with atypical chest pain - felt more like a pulled muscle but no relief with NSAID. He thought the Imdur had helped however. I talked with Dr. Acie Fredrickson - wanted to avoid Myoview - most likely it was felt to turn out abnormal. Elected to just follow for now. Ended up having GI work up in December of 2017- this was felt to be possibly causing some of his prior chest pain - turned out to be a yeast infection and with treatment his chest pain resolved. We got him off Imdur so he could resume Viagra. Still struggling with trying to stop smoking.   Last seen by me back in October - he was doing well. Had gone back on treatment for his CLL. He had stopped smoking.   Comes back today. Here alone. Considering full dental extraction with alveoloplasty in the OR with general anesthesia - due to poor dentition - missing teeth, dental caries, periodontitis, etc. He has had prior abscess earlier this year.  Anxiety does play a role.  From our standpoint, he feels well. No chest pain. Not short of breath. Still working. Can walk several blocks without issue and go up stairs without issue. Some bruising of his arms. Little neuropathy in his feet but overall he feels like he is doing well. He tells me he is in remission for his lymphoma - now getting IVG at home.   Past Medical History:  Diagnosis Date  . Anxiety 01/30/2014  . Back injury    lower disc  . CAD (coronary artery disease)   . CLL (chronic lymphocytic leukemia) ([place]) 03/18/2011  . Diabetes mellitus ([place])    Type 2   . ECRB (extensor carpi radialis brevis) tenosynovitis   . GERD (gastroesophageal reflux disease)    takes Nexium if needed  . Hyperlipidemia   . Lateral epicondylitis of left elbow   . MI, acute, non ST segment elevation ([place]) 06/28/2009   with stenting of the LAD  . Neuromuscular disorder ([place])    peripheral neuropathy  . PVD (peripheral vascular disease) ([place])   . Tobacco abuse     Past Surgical History:  Procedure Laterality Date  . ADENOIDECTOMY  1955  . CARDIAC CATHETERIZATION    . CARDIAC CATHETERIZATION N/A 09/26/2014   Procedure: Left Heart Cath and Coronary Angiography;  Surgeon: Peter M Martinique, MD;  Location: [facility];  Service: Cardiovascular;  Laterality: N/A;  .  carpel tunnel release Left 04-1989  . carpel tunnel release  Right 01-1989  . CHOLECYSTECTOMY  2007  . CORONARY STENT PLACEMENT  May 2011  . femoral stents    . LATERAL EPICONDYLE RELEASE Left 02/12/2014   Procedure: LEFT ELBOW DEBRIDEMENT WITH TENDON REPAIR ;  Surgeon: Lorn Junes, MD;  Location: [facility];  Service: Orthopedics;  Laterality: Left;  . LEFT CAI STENT/PTA AND POPLITEAL ARTERY/TIBIAL THROMBECTOMY     . LEFT HEART CATHETERIZATION WITH CORONARY ANGIOGRAM N/A 08/26/2011   Procedure: LEFT HEART CATHETERIZATION WITH CORONARY ANGIOGRAM;  Surgeon: Peter M Martinique, MD;  Location: [facility];  Service: Cardiovascular;  Laterality: N/A;  . PERIPHERAL  VASCULAR CATHETERIZATION N/A 01/01/2015   Procedure: Abdominal Aortogram;  Surgeon: Conrad [place], MD;  Location: [facility];  Service: Cardiovascular;  Laterality: N/A;  . TARSAL TUNNEL RELEASE Bilateral 08-2007     Medications: Current Meds  Medication Sig  . acetaminophen (TYLENOL) 500 MG tablet Take 500 mg by mouth every 6 (six) hours as needed for mild pain, moderate pain or headache.  Marland Kitchen acyclovir (ZOVIRAX) 400 MG tablet TAKE 1 TABLET BY MOUTH TWICE A DAY  . albuterol (PROVENTIL HFA;VENTOLIN HFA) 108 (90 Base) MCG/ACT inhaler Inhale 2 puffs into the lungs every 6 (six) hours as needed for wheezing or shortness of breath.  . ALPRAZolam (XANAX) 0.25 MG tablet Take 0.25 mg by mouth as needed for anxiety.  Marland Kitchen aspirin EC 81 MG tablet Take 1 tablet (81 mg total) by mouth daily.  Marland Kitchen b complex vitamins tablet Take 1 tablet by mouth daily.   . Capsaicin 0.1 % CREA Apply topically. OTC - use as needed for peripheral neuropathy.  . Cholecalciferol (VITAMIN D-1000 MAX ST) 1000 units tablet Take 1,000 Units by mouth daily.   Marland Kitchen esomeprazole (NEXIUM) 40 MG capsule Take 40 mg by mouth 2 (two) times daily.  . fluticasone (FLONASE) 50 MCG/ACT nasal spray Place into the nose.  . folic acid (FOLVITE) 235 MCG tablet Take 400 mcg by mouth daily.   Marland Kitchen HYDROcodone-acetaminophen (NORCO) 10-325 MG tablet take 1 tablet by mouth every 6 to 8 hours if needed for pain  . lidocaine (LIDODERM) 5 % apply patch to affected area once daily as directed for lower back pain  . Lidocaine 4 % GEL Apply topically. OTC - use as needed for peripheral neuropathy.  . meclizine (ANTIVERT) 12.5 MG tablet Take 12.5 mg by mouth every 6 (six) hours as needed for dizziness.   . metFORMIN (GLUCOPHAGE-XR) 500 MG 24 hr tablet Take by mouth. Takes one tablet in AM and 2 tablets at night.  . metoprolol succinate (TOPROL-XL) 25 MG 24 hr tablet TAKE 1 TABLET (25 MG TOTAL) BY MOUTH DAILY.  . nitroGLYCERIN (NITROSTAT) 0.4 MG SL tablet PLACE  1 TABLET UNDER THE TONGUE EVERY 5 MINUTES X 3 DOSES AS NEEDED FOR CHEST PAIN *MAX 3 DOSES*  . nortriptyline (PAMELOR) 25 MG capsule Take 4 capsules (100 mg total) by mouth at bedtime.  . pregabalin (LYRICA) 200 MG capsule 1 cap three times daily and 2 caps at hs  . rosuvastatin (CRESTOR) 5 MG tablet TAKE 1 TABLET (5 MG TOTAL) BY MOUTH DAILY.  . sildenafil (VIAGRA) 100 MG tablet Take 0.5-1 tablets (50-100 mg total) by mouth daily as needed for erectile dysfunction (30 minutes prior to sexual intercourse).  Marland Kitchen tetrahydrozoline 0.05 % ophthalmic solution Place 1 drop into both eyes as needed. Red eyes  . zolpidem (AMBIEN) 10 MG tablet Take  10 mg by mouth at bedtime as needed for sleep.      Allergies: Allergies  Allergen Reactions  . Codeine Hives    Pt states he can take a few, more reaction with extended doses.    Social History: The patient  reports that he quit smoking about 12 months ago. His smoking use included cigarettes. He has a 22.00 pack-year smoking history. He has quit using smokeless tobacco. He reports that he does not drink alcohol or use drugs.   Family History: The patient's family history includes Cancer in his mother; Heart disease in his father; Heart failure (age of onset: 17) in his father; Lung cancer (age of onset: 36) in his mother.   Review of Systems: Please see the history of present illness.   Otherwise, the review of systems is positive for none.   All other systems are reviewed and negative.   Physical Exam: VS:  BP 128/70 (BP Location: Left Arm, Patient Position: Sitting, Cuff Size: Normal)   Pulse 68   Ht 6' (1.829 m)   Wt 216 lb 12.8 oz (98.3 kg)   BMI 29.40 kg/m  .  BMI Body mass index is 29.4 kg/m.  Wt Readings from Last 3 Encounters:  05/22/17 216 lb 12.8 oz (98.3 kg)  04/21/17 219 lb 12.8 oz (99.7 kg)  04/13/17 213 lb 8 oz (96.8 kg)    General: Pleasant. Well developed, well nourished and in no acute distress.   HEENT: Normal.  Neck:  Supple, no JVD, carotid bruits, or masses noted.  Cardiac: Regular rate and rhythm. No murmurs, rubs, or gallops. No edema.  Respiratory:  Lungs are clear to auscultation bilaterally with normal work of breathing.  GI: Soft and nontender.  MS: No deformity or atrophy. Gait and ROM intact.  Skin: Warm and dry. Color is normal.  Neuro:  Strength and sensation are intact and no gross focal deficits noted.  Psych: Alert, appropriate and with normal affect.   LABORATORY DATA:  EKG:  EKG is ordered today. This demonstrates NSR.  Lab Results  Component Value Date   WBC 3.1 (L) 04/21/2017   HGB 13.0 04/21/2017   HCT 38.7 04/21/2017   PLT 86 (L) 04/21/2017   GLUCOSE 106 04/21/2017   CHOL 146 05/24/2016   TRIG 201 (H) 05/24/2016   HDL 27 (L) 05/24/2016   LDLDIRECT 117.6 08/22/2011   LDLCALC 79 05/24/2016   ALT 13 04/21/2017   AST 12 04/21/2017   NA 140 04/21/2017   K 4.2 04/21/2017   CL 106 04/21/2017   CREATININE 0.89 04/21/2017   BUN 9 04/21/2017   CO2 26 04/21/2017   TSH 1.620 04/13/2017   INR 1.0 09/22/2014   HGBA1C 6.2 (H) 04/13/2017       BNP (last 3 results) No results for input(s): BNP in the last 8760 hours.  ProBNP (last 3 results) No results for input(s): PROBNP in the last 8760 hours.   Other Studies Reviewed Today:  Coronary angiography from 09/2014:  Coronary dominance: right  Left mainstem: Normal.  Left anterior descending (LAD): The stent in the proximal LAD is widely patent throughout. There is moderate 20% narrowing in the LAD proximal to the stent. The remainder of the vessels without significant disease.  Left circumflex (LCx): The left circumflex gives rise to 2 large marginal branches. There is 20% narrowing prior to the takeoff of the first OM.  Right coronary artery (RCA): The right coronary is a dominant vessel. It has diffuse 70% disease  in the proximal to mid vessel. Is occluded at the crux. There are excellent right to right and left to  right collaterals.  Left ventriculography: Left ventricular systolic function is normal, LVEF is estimated at 55-65%, there is no significant mitral regurgitation  Final Conclusions:  1. Single vessel obstructive coronary disease. 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 The stent in the proximal LAD is widely patent.  2. Normal LV function.  Recommendations: Continue medical management.  Peter Martinique  08/26/2011, 9:09 AM   Assessment / Plan:  1. CAD - last cath with stable findings back in August of 2016. He has a chronic total occlusion of the RCA with good collateral flow - stent in the proximal LAD was patent. He is managed medically. He has no symptoms of chest pain. He can achieve over 4 mets of activity. Would favor his continued therapy.   2. Pre op clearance - should be an acceptable candidate for dental procedure with general anesthesia.   3. HTN - BP looks great on his current regimen. No changes made today.   4. HLD - on statin therapy - labs from February noted.   5. Tobacco abuse - resolved. He continues to abstain.    6. CLL/chronic thrombocytopenia - followed by oncology/hematology. In remission.  7. NIDDM - per PCP  8. Obesity - needs CV risk factor modification.   9. Low testosterone level - with his known CAD - I would prefer he not be on therapy for this. Not discussed at today's visit.    Current medicines are reviewed with the patient today.  The patient does not have concerns regarding medicines other than what has been noted above.  The following changes have been made:  See above.  Labs/ tests ordered today include:    Orders Placed This Encounter  Procedures  . EKG 12-Lead     Disposition:   FU with me in 6 months.   Patient is agreeable to this plan and will call if any problems develop in the interim.   SignedTruitt Merle, NP  05/22/2017 8:27 AM  [facility] [address] [facility] [place], [place]  [address] Phone: [phone] Fax: [phone]

## 2017-05-22 NOTE — Patient Instructions (Addendum)
We will be checking the following labs today - NONE   Medication Instructions:    Continue with your current medicines.   It did not look like you needed refills - but if so, let me know.     Testing/Procedures To Be Arranged:  N/A  Follow-Up:   See me in 6 months    Other Special Instructions:   Ok to proceed with your dental procedure - I will send a note to Dr. Enrique Sack    If you need a refill on your cardiac medications before your next appointment, please call your pharmacy.   Call the [facility] office at [phone] if you have any questions, problems or concerns.

## 2017-05-22 NOTE — Telephone Encounter (Signed)
05/22/2017  Patient:            Ryan Copeland Date of Birth:  1950/09/01 MRN:                [id number]   I received an inbox Cardiology note from Ludwig Lean, nurse practitioner for cardiology. The patient has cardiac clearance for the oral surgery in the operating room with general anesthesia.  I then contacted the patient by phone and discussed the plan of care for extraction of remaining teeth with alveoloplasty in the operating room with general anesthesia. The patient is currently thinking about his options concerning dental care with a dentist in [place], [place] versus care in the operating room with general anesthesia with dental medicine. Patient did not wish to proceed with scheduling operating room procedure at this time. Patient will call dental medicine back when he makes a decision concerning his dental treatment. The patient is aware of the limited period of time the current cardiology note is in effect toallow for scheduling of oral surgical procedures. Patient was also made aware of the need to have the other dentist contact Dr. Alvy Bimler before proceeding with oral surgical procedures.  Lenn Cal, DDS

## 2017-06-08 ENCOUNTER — Other Ambulatory Visit: Payer: Self-pay | Admitting: Nurse Practitioner

## 2017-06-11 ENCOUNTER — Other Ambulatory Visit: Payer: Self-pay | Admitting: Hematology and Oncology

## 2017-06-11 DIAGNOSIS — C9110 Chronic lymphocytic leukemia of B-cell type not having achieved remission: Secondary | ICD-10-CM

## 2017-06-17 ENCOUNTER — Other Ambulatory Visit: Payer: Self-pay | Admitting: Hematology and Oncology

## 2017-06-17 DIAGNOSIS — C9110 Chronic lymphocytic leukemia of B-cell type not having achieved remission: Secondary | ICD-10-CM

## 2017-06-22 ENCOUNTER — Telehealth: Payer: Self-pay

## 2017-06-22 NOTE — Telephone Encounter (Signed)
Yes, OK to change 

## 2017-06-22 NOTE — Telephone Encounter (Signed)
Scott, pharmacist at [facility] called and ask if Benadryl could be changed from po to IV with IVIG. Insurance will only pay for IV Benadryl.

## 2017-06-22 NOTE — Telephone Encounter (Signed)
Called and given below message. Verbalized understanding. He will fax over a order to be signed.

## 2017-06-26 ENCOUNTER — Other Ambulatory Visit: Payer: Self-pay | Admitting: Hematology and Oncology

## 2017-06-26 NOTE — Telephone Encounter (Signed)
Any more refills through primary care doctor

## 2017-07-03 ENCOUNTER — Encounter (HOSPITAL_COMMUNITY): Payer: Self-pay | Admitting: Dentistry

## 2017-07-04 ENCOUNTER — Encounter: Payer: Self-pay | Admitting: Hematology and Oncology

## 2017-07-26 ENCOUNTER — Encounter: Payer: Self-pay | Admitting: Hematology and Oncology

## 2017-07-28 ENCOUNTER — Telehealth: Payer: Self-pay | Admitting: Hematology and Oncology

## 2017-07-28 NOTE — Telephone Encounter (Signed)
Left message for patient regarding upcoming appointment updates per 6/14 sch message

## 2017-08-02 ENCOUNTER — Other Ambulatory Visit: Payer: Self-pay | Admitting: Hematology and Oncology

## 2017-08-02 ENCOUNTER — Encounter: Payer: Self-pay | Admitting: Hematology and Oncology

## 2017-08-02 DIAGNOSIS — D801 Nonfamilial hypogammaglobulinemia: Secondary | ICD-10-CM

## 2017-08-02 DIAGNOSIS — C8308 Small cell B-cell lymphoma, lymph nodes of multiple sites: Secondary | ICD-10-CM

## 2017-08-03 ENCOUNTER — Other Ambulatory Visit: Payer: Self-pay | Admitting: Hematology and Oncology

## 2017-08-03 DIAGNOSIS — C8308 Small cell B-cell lymphoma, lymph nodes of multiple sites: Secondary | ICD-10-CM

## 2017-08-03 DIAGNOSIS — D801 Nonfamilial hypogammaglobulinemia: Secondary | ICD-10-CM

## 2017-08-04 ENCOUNTER — Ambulatory Visit: Payer: Self-pay | Admitting: Hematology and Oncology

## 2017-08-04 ENCOUNTER — Inpatient Hospital Stay: Payer: BLUE CROSS/BLUE SHIELD

## 2017-08-04 ENCOUNTER — Other Ambulatory Visit: Payer: Self-pay

## 2017-08-04 ENCOUNTER — Inpatient Hospital Stay: Payer: BLUE CROSS/BLUE SHIELD | Attending: Hematology and Oncology

## 2017-08-04 ENCOUNTER — Inpatient Hospital Stay (HOSPITAL_BASED_OUTPATIENT_CLINIC_OR_DEPARTMENT_OTHER): Payer: BLUE CROSS/BLUE SHIELD | Admitting: Hematology and Oncology

## 2017-08-04 ENCOUNTER — Telehealth: Payer: Self-pay | Admitting: Hematology and Oncology

## 2017-08-04 DIAGNOSIS — D801 Nonfamilial hypogammaglobulinemia: Secondary | ICD-10-CM | POA: Insufficient documentation

## 2017-08-04 DIAGNOSIS — R21 Rash and other nonspecific skin eruption: Secondary | ICD-10-CM | POA: Insufficient documentation

## 2017-08-04 DIAGNOSIS — C8308 Small cell B-cell lymphoma, lymph nodes of multiple sites: Secondary | ICD-10-CM | POA: Diagnosis not present

## 2017-08-04 DIAGNOSIS — Z79899 Other long term (current) drug therapy: Secondary | ICD-10-CM | POA: Insufficient documentation

## 2017-08-04 DIAGNOSIS — D696 Thrombocytopenia, unspecified: Secondary | ICD-10-CM | POA: Diagnosis not present

## 2017-08-04 DIAGNOSIS — I2510 Atherosclerotic heart disease of native coronary artery without angina pectoris: Secondary | ICD-10-CM | POA: Diagnosis not present

## 2017-08-04 DIAGNOSIS — Z95828 Presence of other vascular implants and grafts: Secondary | ICD-10-CM

## 2017-08-04 DIAGNOSIS — G629 Polyneuropathy, unspecified: Secondary | ICD-10-CM

## 2017-08-04 DIAGNOSIS — C9110 Chronic lymphocytic leukemia of B-cell type not having achieved remission: Secondary | ICD-10-CM | POA: Diagnosis not present

## 2017-08-04 LAB — CBC WITH DIFFERENTIAL/PLATELET
BASOS ABS: 0 10*3/uL (ref 0.0–0.1)
Basophils Relative: 0 %
EOS PCT: 0 %
Eosinophils Absolute: 0 10*3/uL (ref 0.0–0.5)
HCT: 41.2 % (ref 38.4–49.9)
Hemoglobin: 14.5 g/dL (ref 13.0–17.1)
LYMPHS PCT: 9 %
Lymphs Abs: 0.5 10*3/uL — ABNORMAL LOW (ref 0.9–3.3)
MCH: 32.7 pg (ref 27.2–33.4)
MCHC: 35.2 g/dL (ref 32.0–36.0)
MCV: 92.8 fL (ref 79.3–98.0)
MONO ABS: 0.2 10*3/uL (ref 0.1–0.9)
Monocytes Relative: 4 %
Neutro Abs: 4.7 10*3/uL (ref 1.5–6.5)
Neutrophils Relative %: 87 %
PLATELETS: 56 10*3/uL — AB (ref 140–400)
RBC: 4.44 MIL/uL (ref 4.20–5.82)
RDW: 14 % (ref 11.0–14.6)
WBC: 5.4 10*3/uL (ref 4.0–10.3)

## 2017-08-04 LAB — COMPREHENSIVE METABOLIC PANEL
ALBUMIN: 3.5 g/dL (ref 3.5–5.0)
ALT: 10 U/L (ref 0–55)
AST: 11 U/L (ref 5–34)
Alkaline Phosphatase: 113 U/L (ref 40–150)
Anion gap: 8 (ref 3–11)
BILIRUBIN TOTAL: 0.4 mg/dL (ref 0.2–1.2)
BUN: 9 mg/dL (ref 7–26)
CALCIUM: 9.1 mg/dL (ref 8.4–10.4)
CO2: 26 mmol/L (ref 22–29)
Chloride: 102 mmol/L (ref 98–109)
Creatinine, Ser: 1.07 mg/dL (ref 0.70–1.30)
GFR calc Af Amer: >60 mL/min (ref >60)
GFR calc non Af Amer: >60 mL/min (ref >60)
GLUCOSE: 343 mg/dL — AB (ref 70–140)
POTASSIUM: 4.4 mmol/L (ref 3.5–5.1)
Sodium: 136 mmol/L (ref 136–145)
TOTAL PROTEIN: 6.7 g/dL (ref 6.4–8.3)

## 2017-08-04 LAB — LACTATE DEHYDROGENASE: LDH: 170 U/L (ref 125–245)

## 2017-08-04 MED ORDER — HEPARIN SOD (PORK) LOCK FLUSH 100 UNIT/ML IV SOLN
500.0000 [IU] | Freq: Once | INTRAVENOUS | Status: AC | PRN
  Administered 2017-08-04: 500 [IU]
  Filled 2017-08-04: qty 5

## 2017-08-04 MED ORDER — SODIUM CHLORIDE 0.9% FLUSH
10.0000 mL | INTRAVENOUS | Status: DC | PRN
  Administered 2017-08-04: 10 mL
  Filled 2017-08-04: qty 10

## 2017-08-04 MED ORDER — TRIAMCINOLONE ACETONIDE 0.5 % EX OINT: 1.0000 "application " | TOPICAL_OINTMENT | Freq: Two times a day (BID) | CUTANEOUS | 0 refills | Status: DC

## 2017-08-04 MED ORDER — ALTEPLASE 2 MG IJ SOLR
INTRAMUSCULAR | Status: AC
  Filled 2017-08-04: qty 2

## 2017-08-04 MED ORDER — ALTEPLASE 2 MG IJ SOLR
2.0000 mg | Freq: Once | INTRAMUSCULAR | Status: AC | PRN
  Administered 2017-08-04: 2 mg
  Filled 2017-08-04: qty 2

## 2017-08-04 NOTE — Telephone Encounter (Signed)
Mailed patient calendar of upcoming December appointments.  °

## 2017-08-04 NOTE — Telephone Encounter (Signed)
Gave patient avs and calendar of upcoming December appointments.  °

## 2017-08-05 LAB — IGG, IGA, IGM
IGA: 47 mg/dL — AB (ref 61–437)
IGM (IMMUNOGLOBULIN M), SRM: 43 mg/dL (ref 20–172)
IgG (Immunoglobin G), Serum: 1237 mg/dL (ref 700–1600)

## 2017-08-05 LAB — HIV ANTIBODY (ROUTINE TESTING W REFLEX): HIV Screen 4th Generation wRfx: NONREACTIVE

## 2017-08-05 LAB — HEPATITIS C ANTIBODY: HCV Ab: <0.1 s/co ratio (ref 0.0–0.9)

## 2017-08-05 LAB — HEPATITIS B SURFACE ANTIGEN: Hepatitis B Surface Ag: NEGATIVE

## 2017-08-05 LAB — HEPATITIS B SURFACE ANTIBODY,QUALITATIVE: Hep B S Ab: REACTIVE

## 2017-08-05 LAB — HEPATITIS B CORE ANTIBODY, TOTAL: Hep B Core Total Ab: POSITIVE — AB

## 2017-08-07 ENCOUNTER — Encounter: Payer: Self-pay | Admitting: Hematology and Oncology

## 2017-08-07 DIAGNOSIS — R21 Rash and other nonspecific skin eruption: Secondary | ICD-10-CM | POA: Insufficient documentation

## 2017-08-07 NOTE — Progress Notes (Signed)
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 He appears to be responding well to monthly IVIG with reduce risk of severe infection He will continue home infusion once a month  Skin rash He has diffuse skin rash of unknown etiology He was prescribed topical cream by his family physician and just started using it If the cream does not help with the rash, I would recommend dermatologist follow-up The appearance of the rash is not related to CLL  Thrombocytopenia 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     No orders of the defined types were placed in this encounter.   INTERVAL HISTORY: Please see below for problem oriented charting. He returns for further follow-up He had recent skin rash and was prescribed topical cream by his family physician The distribution of the rash is not specific.  It is mildly itchy, predominantly in the upper torso. He has no new lymphadenopathy Denies recent infection No serum sickness from IVIG The patient denies any recent signs or symptoms of bleeding such as spontaneous epistaxis, hematuria or hematochezia.   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  No  evidence of aortic dissection or other acute process in the chest. 2.  Centrilobular emphysema with a 5 mm right lung nodule. Given the concurrent centrilobular emphysema, follow-up chest CT at 6 -12 months is recommended.  3.  Coronary artery atherosclerosis which is age advanced. 4.  Prominent thoracic lymph nodes.  These can be reevaluated at follow-up.      05/06/2010 Imaging    1.  Multiple small periaortic lymph nodes consistent with the patient's history of the chronic lymphocytic leukemia. 2.  No evidence of solid organ involvement      11/03/2011 Imaging    1.  Interval progression of abdominal and pelvic adenopathy. 2.  Progression of splenomegaly.  The spleen now measures 23 cm in length      11/18/2011 Bone Marrow Biopsy    Bone Marrow, Aspirate,Biopsy, and Clot, right iliac bone - HYPERCELLULAR BONE MARROW WITH EXTENSIVE INVOLVEMENT BY CHRONIC LYMPHOCYTIC LEUKEMIA. PERIPHERAL BLOOD: - CHRONIC LYMPHOCYTIC LEUKEMIA      03/08/2012 Imaging    1.  Progressive increase in retroperitoneal, iliac, and inguinal lymphadenopathy. 2.  Interval increase in massive splenomegaly.       06/01/2012 Procedure    Placement of single lumen port a cath via right internal jugular vein.  The catheter tip lies at the cavoatrial junction.  A power injectable port a cath was placed and is ready for immediate use      06/20/2012 - 11/23/2012 Chemotherapy    He received FCR x 6 cycles      12/19/2012 Imaging    Left common iliac stent. Abdominal vasculature remains patent. Improving supraclavicular and axillary lymphadenopathy. Residual right subpectoral  nodes measure up to 10 mm short axis. Improving retroperitoneal lymphadenopathy, measuring up to 16 mm short axis. Improving splenomegaly, measuring 18.7 cm.       01/20/2016 Imaging    1. Stable exam.  No new or progressive findings. 2. No CT findings to explain odynophagia      09/02/2016 Pathology Results    The findings are consistent  with involvement by previously known chronic lymphocytic leukemia      09/02/2016 Pathology Results    FISH for CLL came back positive for deletion 13q      09/15/2016 Imaging    1. Borderline enlarged abdominal and pelvic lymph nodes. Compared with 11/03/2011 these are decreased in size as detailed above. 2. Persistent splenomegaly. 3. Aortic Atherosclerosis (ICD10-I70.0). LAD coronary artery calcification noted.      10/06/2016 - 02/24/2017 Chemotherapy    He received Bendamustine and Rituxan      11/03/2016 Adverse Reaction    Dose of Bendamustine is reduced due to severe pancytopenia      12/28/2016 Imaging    1. Borderline enlarged abdominal peritoneal ligament and abdominal retroperitoneal lymph nodes, stable. 2. Splenomegaly. 3.  Aortic atherosclerosis (ICD10-170.0).      03/22/2017 PET scan    1. No hypermetabolic adenopathy identified within the neck, chest, abdomen or pelvis. 2. Prominent left retroperitoneal node measures 1.6 cm without significant FDG uptake. 3. Splenomegaly. 4. Aortic Atherosclerosis (ICD10-I70.0) and Emphysema (ICD10-J43.9). LAD and left circumflex atherosclerotic calcifications noted.       REVIEW OF SYSTEMS:   Constitutional: Denies fevers, chills or abnormal weight loss Eyes: Denies blurriness of vision Ears, nose, mouth, throat, and face: Denies mucositis or sore throat Respiratory: Denies cough, dyspnea or wheezes Cardiovascular: Denies palpitation, chest discomfort or lower extremity swelling Gastrointestinal:  Denies nausea, heartburn or change in bowel habits Lymphatics: Denies new lymphadenopathy or easy bruising Neurological:Denies numbness, tingling or new weaknesses Behavioral/Psych: Mood is stable, no new changes  All other systems were reviewed with the patient and are negative.  I have reviewed the past medical history, past surgical history, social history and family history with the patient and they are unchanged from previous  note.  ALLERGIES:  is allergic to codeine.  MEDICATIONS:  Current Outpatient Medications  Medication Sig Dispense Refill  . clotrimazole (LOTRIMIN) 1 % cream Apply 1 application topically 2 (two) times daily.    Marland Kitchen acetaminophen (TYLENOL) 500 MG tablet Take 500 mg by mouth every 6 (six) hours as needed for mild pain, moderate pain or headache.    Marland Kitchen acyclovir (ZOVIRAX) 400 MG tablet TAKE 1 TABLET BY MOUTH TWICE A DAY 180 tablet 0  . ALPRAZolam (XANAX) 0.25 MG tablet Take 0.25 mg by mouth as needed for anxiety.    Marland Kitchen aspirin EC 81 MG tablet Take 1 tablet (81 mg total) by mouth daily.    Marland Kitchen b complex vitamins tablet Take 1 tablet by mouth daily.     . Capsaicin 0.1 % CREA Apply topically. OTC - use as needed for peripheral neuropathy.    . Cholecalciferol (VITAMIN D-1000 MAX ST) 1000 units tablet Take 1,000 Units by mouth daily.     Marland Kitchen esomeprazole (NEXIUM) 40 MG capsule Take 40 mg by mouth 2 (two) times daily.  0  . fluticasone (FLONASE) 50 MCG/ACT nasal spray Place into the nose.    . folic acid (FOLVITE) 657 MCG tablet Take 400 mcg by mouth daily.     Marland Kitchen HYDROcodone-acetaminophen (NORCO) 10-325 MG tablet take 1 tablet by mouth  every 6 to 8 hours if needed for pain  0  . lidocaine (LIDODERM) 5 % apply patch to affected area once daily as directed for lower back pain  0  . Lidocaine 4 % GEL Apply topically. OTC - use as needed for peripheral neuropathy.    . lidocaine-prilocaine (EMLA) cream APPLY TO AFFECTED AREA ONCE 30 g 3  . meclizine (ANTIVERT) 12.5 MG tablet Take 12.5 mg by mouth every 6 (six) hours as needed for dizziness.   0  . metFORMIN (GLUCOPHAGE-XR) 500 MG 24 hr tablet Take by mouth. Takes one tablet in AM and 2 tablets at night.    . metoprolol succinate (TOPROL-XL) 25 MG 24 hr tablet TAKE 1 TABLET (25 MG TOTAL) BY MOUTH DAILY. 90 tablet 3  . nitroGLYCERIN (NITROSTAT) 0.4 MG SL tablet PLACE 1 TABLET UNDER THE TONGUE EVERY 5 MINUTES X 3 DOSES AS NEEDED FOR CHEST PAIN *MAX 3 DOSES* 25  tablet 5  . nortriptyline (PAMELOR) 25 MG capsule Take 4 capsules (100 mg total) by mouth at bedtime. 120 capsule 11  . pregabalin (LYRICA) 200 MG capsule 1 cap three times daily and 2 caps at hs 270 capsule 4  . PROVENTIL HFA 108 (90 Base) MCG/ACT inhaler TAKE 2 PUFFS BY MOUTH EVERY 6 HOURS AS NEEDED FOR WHEEZE OR SHORTNESS OF BREATH 20.1 Inhaler 2  . rosuvastatin (CRESTOR) 5 MG tablet TAKE 1 TABLET (5 MG TOTAL) BY MOUTH DAILY. 90 tablet 3  . sildenafil (VIAGRA) 100 MG tablet TAKE 1/2-1 TABLET DAILY AS NEEDED FOR ERECTILE DYSFUNCTION (30 MINUTES PRIOR TO SEXUAL INTERCOURSE) 10 tablet 6  . tetrahydrozoline 0.05 % ophthalmic solution Place 1 drop into both eyes as needed. Red eyes    . triamcinolone ointment (KENALOG) 0.5 % Apply 1 application topically 2 (two) times daily. 30 g 0  . zolpidem (AMBIEN) 10 MG tablet Take 10 mg by mouth at bedtime as needed for sleep.   0   No current facility-administered medications for this visit.    Facility-Administered Medications Ordered in Other Visits  Medication Dose Route Frequency Provider Last Rate Last Dose  . 0.9 %  sodium chloride infusion   Intravenous Continuous Alvy Bimler, Soraida Vickers, MD 50 mL/hr at 03/07/14 1005    . sodium chloride 0.9 % injection 10 mL  10 mL Intracatheter PRN Marcy Panning, MD   10 mL at 08/22/12 1721    PHYSICAL EXAMINATION: ECOG PERFORMANCE STATUS: 1 - Symptomatic but completely ambulatory  Vitals:   08/04/17 1355  BP: 134/71  Pulse: (!) 57  Resp: 18  Temp: 98 F (36.7 C)  SpO2: 99%   Filed Weights   08/04/17 1355  Weight: 222 lb 8 oz (100.9 kg)    GENERAL:alert, no distress and comfortable SKIN: Appearance of the skin rash is most consistent with dermatitis EYES: normal, Conjunctiva are pink and non-injected, sclera clear OROPHARYNX:no exudate, no erythema and lips, buccal mucosa, and tongue normal  NECK: supple, thyroid normal size, non-tender, without nodularity LYMPH:  no palpable lymphadenopathy in the cervical,  axillary or inguinal LUNGS: clear to auscultation and percussion with normal breathing effort HEART: regular rate & rhythm and no murmurs and no lower extremity edema ABDOMEN:abdomen soft, non-tender and normal bowel sounds Musculoskeletal:no cyanosis of digits and no clubbing  NEURO: alert & oriented x 3 with fluent speech, no focal motor/sensory deficits  LABORATORY DATA:  I have reviewed the data as listed    Component Value Date/Time   NA 136 08/04/2017 1342   NA  136 01/26/2017 0940   K 4.4 08/04/2017 1342   K 4.0 01/26/2017 0940   CL 102 08/04/2017 1342   CL 104 08/03/2012 1229   CO2 26 08/04/2017 1342   CO2 23 01/26/2017 0940   GLUCOSE 343 (H) 08/04/2017 1342   GLUCOSE 146 (H) 01/26/2017 0940   GLUCOSE 223 (H) 08/03/2012 1229   BUN 9 08/04/2017 1342   BUN 9.6 01/26/2017 0940   CREATININE 1.07 08/04/2017 1342   CREATININE 1.0 01/26/2017 0940   CALCIUM 9.1 08/04/2017 1342   CALCIUM 8.7 01/26/2017 0940   PROT 6.7 08/04/2017 1342   PROT 5.9 (L) 04/13/2017 0823   PROT 5.7 (L) 01/26/2017 0940   ALBUMIN 3.5 08/04/2017 1342   ALBUMIN 3.6 01/26/2017 0940   AST 11 08/04/2017 1342   AST 13 01/26/2017 0940   ALT 10 08/04/2017 1342   ALT 12 01/26/2017 0940   ALKPHOS 113 08/04/2017 1342   ALKPHOS 78 01/26/2017 0940   BILITOT 0.4 08/04/2017 1342   BILITOT 0.64 01/26/2017 0940   GFRNONAA >60 08/04/2017 1342   GFRAA >60 08/04/2017 1342    No results found for: SPEP, UPEP  Lab Results  Component Value Date   WBC 5.4 08/04/2017   NEUTROABS 4.7 08/04/2017   HGB 14.5 08/04/2017   HCT 41.2 08/04/2017   MCV 92.8 08/04/2017   PLT 56 (L) 08/04/2017      Chemistry      Component Value Date/Time   NA 136 08/04/2017 1342   NA 136 01/26/2017 0940   K 4.4 08/04/2017 1342   K 4.0 01/26/2017 0940   CL 102 08/04/2017 1342   CL 104 08/03/2012 1229   CO2 26 08/04/2017 1342   CO2 23 01/26/2017 0940   BUN 9 08/04/2017 1342   BUN 9.6 01/26/2017 0940   CREATININE 1.07 08/04/2017  1342   CREATININE 1.0 01/26/2017 0940      Component Value Date/Time   CALCIUM 9.1 08/04/2017 1342   CALCIUM 8.7 01/26/2017 0940   ALKPHOS 113 08/04/2017 1342   ALKPHOS 78 01/26/2017 0940   AST 11 08/04/2017 1342   AST 13 01/26/2017 0940   ALT 10 08/04/2017 1342   ALT 12 01/26/2017 0940   BILITOT 0.4 08/04/2017 1342   BILITOT 0.64 01/26/2017 0940      All questions were answered. The patient knows to call the clinic with any problems, questions or concerns. No barriers to learning was detected.  I spent 15 minutes counseling the patient face to face. The total time spent in the appointment was 20 minutes and more than 50% was on counseling and review of test results  [name], MD 08/07/2017 8:00 AM

## 2017-08-07 NOTE — Assessment & Plan Note (Signed)
Overall, he has no signs or symptoms of cancer recurrence I will continue to see him every 6 months with history, physical examination, blood work and follow-up

## 2017-08-07 NOTE — Assessment & Plan Note (Addendum)
He has diffuse skin rash of unknown etiology He was prescribed topical cream by his family physician and just started using it If the cream does not help with the rash, I would recommend dermatologist follow-up The appearance of the rash is not related to CLL

## 2017-08-07 NOTE — Assessment & Plan Note (Signed)
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

## 2017-08-07 NOTE — Assessment & Plan Note (Signed)
He has chronic acquired hypogammaglobulinemia since treatment He appears to be responding well to monthly IVIG with reduce risk of severe infection He will continue home infusion once a month

## 2017-08-14 ENCOUNTER — Other Ambulatory Visit: Payer: Self-pay | Admitting: Hematology and Oncology

## 2017-08-14 ENCOUNTER — Telehealth: Payer: Self-pay

## 2017-08-14 ENCOUNTER — Encounter: Payer: Self-pay | Admitting: Hematology and Oncology

## 2017-08-14 DIAGNOSIS — C8308 Small cell B-cell lymphoma, lymph nodes of multiple sites: Secondary | ICD-10-CM

## 2017-08-14 NOTE — Telephone Encounter (Signed)
Called and gave Pam with [facility] below message. She verbalized understanding.

## 2017-08-14 NOTE — Telephone Encounter (Signed)
-----   Message from [name], MD sent at 08/14/2017 11:22 AM EDT ----- Regarding: rash and port Please see mychart message  1) can you get [facility] to switch to [place] (possible rash due to Privigen)? 2) Arrange IR visit to evaluate port?  Thanks

## 2017-08-15 ENCOUNTER — Other Ambulatory Visit: Payer: Self-pay | Admitting: Hematology and Oncology

## 2017-08-15 DIAGNOSIS — R21 Rash and other nonspecific skin eruption: Secondary | ICD-10-CM

## 2017-08-15 DIAGNOSIS — C8308 Small cell B-cell lymphoma, lymph nodes of multiple sites: Secondary | ICD-10-CM

## 2017-08-15 MED ORDER — PREDNISONE 20 MG PO TABS: 20.0000 mg | ORAL_TABLET | Freq: Every day | ORAL | 0 refills | Status: DC

## 2017-08-18 ENCOUNTER — Ambulatory Visit (HOSPITAL_COMMUNITY)
Admission: RE | Admit: 2017-08-18 | Discharge: 2017-08-18 | Disposition: A | Discharge status: Home / Self Care | Payer: Medicare Other | Source: Ambulatory Visit | Attending: Hematology and Oncology | Admitting: Hematology and Oncology

## 2017-08-18 ENCOUNTER — Encounter (HOSPITAL_COMMUNITY): Payer: Self-pay | Admitting: Interventional Radiology

## 2017-08-18 ENCOUNTER — Other Ambulatory Visit: Payer: Self-pay | Admitting: Hematology and Oncology

## 2017-08-18 DIAGNOSIS — Z452 Encounter for adjustment and management of vascular access device: Secondary | ICD-10-CM | POA: Insufficient documentation

## 2017-08-18 DIAGNOSIS — C9110 Chronic lymphocytic leukemia of B-cell type not having achieved remission: Secondary | ICD-10-CM | POA: Diagnosis not present

## 2017-08-18 DIAGNOSIS — C8308 Small cell B-cell lymphoma, lymph nodes of multiple sites: Secondary | ICD-10-CM

## 2017-08-18 DIAGNOSIS — I878 Other specified disorders of veins: Secondary | ICD-10-CM | POA: Diagnosis not present

## 2017-08-18 HISTORY — PX: IR CV LINE INJECTION: IMG2294

## 2017-08-18 MED ORDER — HEPARIN SOD (PORK) LOCK FLUSH 100 UNIT/ML IV SOLN
INTRAVENOUS | Status: AC
  Administered 2017-08-18: 500 [IU]
  Filled 2017-08-18: qty 5

## 2017-08-18 MED ORDER — IOPAMIDOL (ISOVUE-300) INJECTION 61%
50.0000 mL | Freq: Once | INTRAVENOUS | Status: AC | PRN
  Administered 2017-08-18: 10 mL

## 2017-08-18 MED ORDER — IOPAMIDOL (ISOVUE-300) INJECTION 61%
INTRAVENOUS | Status: AC
  Administered 2017-08-18: 10 mL
  Filled 2017-08-18: qty 50

## 2017-08-18 NOTE — Procedures (Signed)
Pre Procedure Dx: Pain during port injection.  Post Procedural Dx: Same  Appropriately positioned and functioning right internal jugular vein approach port a catheter without explanation for patient's intermittent discomfort during Port a catheter injection.  Specifically, there is no evidence of Port a catheter reservoir catheter leak.  No evidence of a fibrin sheath.   Estimated Blood Loss: None  Complications: None immediate.  Ronny Bacon, MD Pager #: [phone]

## 2017-08-23 ENCOUNTER — Encounter: Payer: Self-pay | Admitting: Hematology and Oncology

## 2017-08-24 ENCOUNTER — Telehealth: Payer: Self-pay

## 2017-08-24 ENCOUNTER — Other Ambulatory Visit: Payer: Self-pay | Admitting: Hematology and Oncology

## 2017-08-24 DIAGNOSIS — C8308 Small cell B-cell lymphoma, lymph nodes of multiple sites: Secondary | ICD-10-CM

## 2017-08-24 NOTE — Telephone Encounter (Signed)
VM from Lorelee New, nurse with [facility], asking if OK for IVIG the day after pt has oral surgery.  Per Dr Alvy Bimler OK.  Fritzi made aware.  Also, informed her on pt's concerns over port and that 2 vm have been left for him today to please call so we can schedule appt with IR to look at his port prior to 7/26 IVIG infusion.

## 2017-08-25 ENCOUNTER — Telehealth: Payer: Self-pay

## 2017-08-25 NOTE — Telephone Encounter (Signed)
Spoke with pt by phone.  He confirms that IR has called him to set up appt to look at port.  Pt also aware that Dr Alvy Bimler OK with IVIG infusion the day after oral surgery.

## 2017-08-30 ENCOUNTER — Ambulatory Visit (HOSPITAL_COMMUNITY)
Admission: RE | Admit: 2017-08-30 | Discharge: 2017-08-30 | Disposition: A | Discharge status: Home / Self Care | Payer: Medicare Other | Source: Ambulatory Visit | Attending: Hematology and Oncology | Admitting: Hematology and Oncology

## 2017-08-30 DIAGNOSIS — C8308 Small cell B-cell lymphoma, lymph nodes of multiple sites: Secondary | ICD-10-CM

## 2017-09-01 ENCOUNTER — Ambulatory Visit (HOSPITAL_COMMUNITY)
Admission: RE | Admit: 2017-09-01 | Discharge: 2017-09-01 | Disposition: A | Discharge status: Home / Self Care | Payer: Medicare Other | Source: Ambulatory Visit | Attending: Hematology and Oncology | Admitting: Hematology and Oncology

## 2017-09-01 ENCOUNTER — Encounter (HOSPITAL_COMMUNITY): Payer: Self-pay | Admitting: Interventional Radiology

## 2017-09-01 ENCOUNTER — Other Ambulatory Visit: Payer: Self-pay | Admitting: Hematology and Oncology

## 2017-09-01 DIAGNOSIS — T82848A Pain from vascular prosthetic devices, implants and grafts, initial encounter: Secondary | ICD-10-CM | POA: Insufficient documentation

## 2017-09-01 DIAGNOSIS — C8308 Small cell B-cell lymphoma, lymph nodes of multiple sites: Secondary | ICD-10-CM

## 2017-09-01 DIAGNOSIS — Y712 Prosthetic and other implants, materials and accessory cardiovascular devices associated with adverse incidents: Secondary | ICD-10-CM | POA: Insufficient documentation

## 2017-09-01 HISTORY — PX: IR CV LINE INJECTION: IMG2294

## 2017-09-01 MED ORDER — IOPAMIDOL (ISOVUE-300) INJECTION 61%
INTRAVENOUS | Status: AC
  Filled 2017-09-01: qty 50

## 2017-09-01 MED ORDER — HEPARIN SOD (PORK) LOCK FLUSH 100 UNIT/ML IV SOLN
INTRAVENOUS | Status: AC
  Filled 2017-09-01: qty 5

## 2017-09-01 MED ORDER — IOPAMIDOL (ISOVUE-300) INJECTION 61%
50.0000 mL | Freq: Once | INTRAVENOUS | Status: AC | PRN
  Administered 2017-09-01: 7 mL

## 2017-09-01 NOTE — Procedures (Signed)
Interventional Radiology Procedure Note  Procedure: Sterile access and injection of right IJ port, per patient request to investigate "burning" sensation after access last 2 times of use, including last port injection.  No fracture. No extravasation of contrast No limiting fibrin sheath. No evidence of infection.   He complains of "burning" at the site of the port.  He tells me this sensation is one again occurring at the completion of our study today.   .  Complications: None  Recommendations:  - I do not have a definite reason he is experiencing burning at the site.  There is no radiographic etiology.  He might be developing a sensitivity/allergy to the chlorhexidine prep.  I suggested that he ask for a different cleaning agent the next time he has access, to see if there is a different result.  - Port ok to continue using.  Signed,  Dulcy Fanny. Earleen Newport, DO

## 2017-09-04 ENCOUNTER — Other Ambulatory Visit: Payer: Self-pay | Admitting: Hematology and Oncology

## 2017-09-04 ENCOUNTER — Other Ambulatory Visit: Payer: Self-pay | Admitting: Nurse Practitioner

## 2017-09-04 DIAGNOSIS — C9110 Chronic lymphocytic leukemia of B-cell type not having achieved remission: Secondary | ICD-10-CM

## 2017-09-11 ENCOUNTER — Telehealth: Payer: Self-pay

## 2017-09-11 ENCOUNTER — Other Ambulatory Visit: Payer: Self-pay

## 2017-09-11 ENCOUNTER — Encounter: Payer: Self-pay | Admitting: Hematology and Oncology

## 2017-09-11 ENCOUNTER — Telehealth: Payer: Self-pay | Admitting: Hematology and Oncology

## 2017-09-11 MED ORDER — PREDNISONE 20 MG PO TABS: 40.0000 mg | ORAL_TABLET | Freq: Every day | ORAL | 0 refills | Status: AC

## 2017-09-11 NOTE — Telephone Encounter (Signed)
Spoke to patient regarding upcoming aug appts per 7/29 sch message

## 2017-09-11 NOTE — Telephone Encounter (Signed)
Called regarding mychart message. Per Dr. Alvy Bimler. Rx sent for Prednisone 40 mg daily x 7 days to pharmacy and instructed to take Benadryl 25 mg TID. He verbalized understanding. Scheduling message sent for lab and appt to see Dr. Alvy Bimler for Thursday.  Instructed to call the office for changes. He verbalized understanding.

## 2017-09-14 ENCOUNTER — Encounter: Payer: Self-pay | Admitting: Hematology and Oncology

## 2017-09-14 ENCOUNTER — Inpatient Hospital Stay: Payer: BLUE CROSS/BLUE SHIELD | Attending: Hematology and Oncology

## 2017-09-14 ENCOUNTER — Telehealth: Payer: Self-pay | Admitting: Medical Oncology

## 2017-09-14 ENCOUNTER — Telehealth: Payer: Self-pay | Admitting: Hematology and Oncology

## 2017-09-14 ENCOUNTER — Inpatient Hospital Stay (HOSPITAL_BASED_OUTPATIENT_CLINIC_OR_DEPARTMENT_OTHER): Payer: BLUE CROSS/BLUE SHIELD | Admitting: Hematology and Oncology

## 2017-09-14 DIAGNOSIS — D801 Nonfamilial hypogammaglobulinemia: Secondary | ICD-10-CM | POA: Diagnosis not present

## 2017-09-14 DIAGNOSIS — R161 Splenomegaly, not elsewhere classified: Secondary | ICD-10-CM

## 2017-09-14 DIAGNOSIS — D696 Thrombocytopenia, unspecified: Secondary | ICD-10-CM | POA: Diagnosis not present

## 2017-09-14 DIAGNOSIS — C8308 Small cell B-cell lymphoma, lymph nodes of multiple sites: Secondary | ICD-10-CM | POA: Insufficient documentation

## 2017-09-14 DIAGNOSIS — I2510 Atherosclerotic heart disease of native coronary artery without angina pectoris: Secondary | ICD-10-CM | POA: Diagnosis not present

## 2017-09-14 DIAGNOSIS — R21 Rash and other nonspecific skin eruption: Secondary | ICD-10-CM

## 2017-09-14 DIAGNOSIS — C9111 Chronic lymphocytic leukemia of B-cell type in remission: Secondary | ICD-10-CM | POA: Diagnosis not present

## 2017-09-14 DIAGNOSIS — Z79899 Other long term (current) drug therapy: Secondary | ICD-10-CM | POA: Diagnosis not present

## 2017-09-14 DIAGNOSIS — C9110 Chronic lymphocytic leukemia of B-cell type not having achieved remission: Secondary | ICD-10-CM

## 2017-09-14 LAB — COMPREHENSIVE METABOLIC PANEL
ALBUMIN: 3.3 g/dL — AB (ref 3.5–5.0)
ALT: 22 U/L (ref 0–44)
AST: 23 U/L (ref 15–41)
Alkaline Phosphatase: 102 U/L (ref 38–126)
Anion gap: 7 (ref 5–15)
BUN: 14 mg/dL (ref 8–23)
CHLORIDE: 104 mmol/L (ref 98–111)
CO2: 30 mmol/L (ref 22–32)
CREATININE: 1.19 mg/dL (ref 0.61–1.24)
Calcium: 8.8 mg/dL — ABNORMAL LOW (ref 8.9–10.3)
GFR calc Af Amer: >60 mL/min (ref >60)
GLUCOSE: 157 mg/dL — AB (ref 70–99)
POTASSIUM: 4.2 mmol/L (ref 3.5–5.1)
Sodium: 141 mmol/L (ref 135–145)
Total Bilirubin: 0.4 mg/dL (ref 0.3–1.2)
Total Protein: 6.9 g/dL (ref 6.5–8.1)

## 2017-09-14 LAB — CBC WITH DIFFERENTIAL/PLATELET
Basophils Absolute: 0 10*3/uL (ref 0.0–0.1)
Basophils Relative: 0 %
EOS ABS: 0 10*3/uL (ref 0.0–0.5)
EOS PCT: 0 %
HCT: 38 % — ABNORMAL LOW (ref 38.4–49.9)
Hemoglobin: 13 g/dL (ref 13.0–17.1)
LYMPHS ABS: 1 10*3/uL (ref 0.9–3.3)
LYMPHS PCT: 24 %
MCH: 32.5 pg (ref 27.2–33.4)
MCHC: 34.2 g/dL (ref 32.0–36.0)
MCV: 95 fL (ref 79.3–98.0)
MONO ABS: 0.3 10*3/uL (ref 0.1–0.9)
Monocytes Relative: 8 %
Neutro Abs: 2.9 10*3/uL (ref 1.5–6.5)
Neutrophils Relative %: 68 %
PLATELETS: 66 10*3/uL — AB (ref 140–400)
RBC: 4 MIL/uL — ABNORMAL LOW (ref 4.20–5.82)
RDW: 16.5 % — ABNORMAL HIGH (ref 11.0–14.6)
WBC: 4.2 10*3/uL (ref 4.0–10.3)

## 2017-09-14 LAB — LACTATE DEHYDROGENASE: LDH: 223 U/L — AB (ref 98–192)

## 2017-09-14 NOTE — Telephone Encounter (Signed)
IVIG tolerated well in home. Rash persists on body except face, neck or head.  Oral surgery cancelled. Disagreement with  ? Dentist or oral surgeon. Pt told [facility] he is not using that dentist anymore. Pt saw Dr Alvy Bimler today.

## 2017-09-14 NOTE — Telephone Encounter (Signed)
No 8/1 los/orders/referrals.

## 2017-09-15 ENCOUNTER — Encounter: Payer: Self-pay | Admitting: Hematology and Oncology

## 2017-09-15 NOTE — Assessment & Plan Note (Signed)
He has acquired hypogammaglobulinemia secondary to CLL and prior treatment With IVIG treatment, he has less infection Due to reaction to possibly Privigen, I will try to contact [facility] to see if we can switch him to [place]

## 2017-09-15 NOTE — Assessment & Plan Note (Signed)
He has significant skin rash which I suspect is due to reaction to IVIG We discussed the risk, benefits, side effects of discontinuation of IVIG versus changing him to a different brand For now, I recommend prednisone therapy I took pictures today He will give me an update next week

## 2017-09-15 NOTE — Assessment & Plan Note (Signed)
He does not need treatment for CLL We will observe closely for signs and symptoms of cancer recurrence So far, he appears to be in remission

## 2017-09-15 NOTE — Progress Notes (Signed)
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   INTERVAL HISTORY: Please see below for problem oriented charting. He returns for further follow-up He developed significant skin itching, rash discomfort since his last IVIG treatment He tolerated prednisone well and that seems to help with the skin itching and the rash He denies recent infection, fever or chills The patient denies any recent signs or symptoms of bleeding such as spontaneous epistaxis, hematuria or hematochezia.  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  No  evidence of aortic dissection or other acute process in the chest. 2.  Centrilobular emphysema with a 5 mm right lung nodule. Given the concurrent centrilobular emphysema, follow-up chest CT at 6 -12 months is recommended.  3.  Coronary artery atherosclerosis which is age advanced. 4.  Prominent thoracic lymph nodes.  These can be reevaluated at follow-up.      05/06/2010 Imaging    1.  Multiple small periaortic lymph nodes consistent with the patient's history of the chronic lymphocytic leukemia. 2.  No evidence of solid organ involvement      11/03/2011 Imaging    1.  Interval progression of abdominal and pelvic adenopathy. 2.  Progression of splenomegaly.  The spleen now measures 23 cm in length      11/18/2011 Bone Marrow Biopsy    Bone Marrow, Aspirate,Biopsy, and Clot, right iliac bone - HYPERCELLULAR BONE MARROW WITH EXTENSIVE INVOLVEMENT BY CHRONIC LYMPHOCYTIC LEUKEMIA. PERIPHERAL BLOOD: - CHRONIC LYMPHOCYTIC LEUKEMIA      03/08/2012 Imaging    1.  Progressive increase in retroperitoneal, iliac, and inguinal lymphadenopathy. 2.  Interval increase in massive splenomegaly.       06/01/2012 Procedure    Placement of single lumen port a cath via right internal jugular vein.  The catheter tip lies at the cavoatrial junction.  A power injectable port a cath was placed and is ready for immediate use      06/20/2012 - 11/23/2012 Chemotherapy    He received FCR x 6 cycles      12/19/2012 Imaging    Left common iliac stent. Abdominal vasculature remains patent. Improving supraclavicular and axillary lymphadenopathy. Residual right  subpectoral nodes measure up to 10 mm short axis. Improving retroperitoneal lymphadenopathy, measuring up to 16 mm short axis. Improving splenomegaly, measuring 18.7 cm.       01/20/2016 Imaging    1. Stable exam.  No new or progressive findings. 2. No CT findings to explain odynophagia      09/02/2016 Pathology Results    The findings are consistent  with involvement by previously known chronic lymphocytic leukemia      09/02/2016 Pathology Results    FISH for CLL came back positive for deletion 13q      09/15/2016 Imaging    1. Borderline enlarged abdominal and pelvic lymph nodes. Compared with 11/03/2011 these are decreased in size as detailed above. 2. Persistent splenomegaly. 3. Aortic Atherosclerosis (ICD10-I70.0). LAD coronary artery calcification noted.      10/06/2016 - 02/24/2017 Chemotherapy    He received Bendamustine and Rituxan      11/03/2016 Adverse Reaction    Dose of Bendamustine is reduced due to severe pancytopenia      12/28/2016 Imaging    1. Borderline enlarged abdominal peritoneal ligament and abdominal retroperitoneal lymph nodes, stable. 2. Splenomegaly. 3.  Aortic atherosclerosis (ICD10-170.0).      03/22/2017 PET scan    1. No hypermetabolic adenopathy identified within the neck, chest, abdomen or pelvis. 2. Prominent left retroperitoneal node measures 1.6 cm without significant FDG uptake. 3. Splenomegaly. 4. Aortic Atherosclerosis (ICD10-I70.0) and Emphysema (ICD10-J43.9). LAD and left circumflex atherosclerotic calcifications noted.       REVIEW OF SYSTEMS:   Constitutional: Denies fevers, chills or abnormal weight loss Eyes: Denies blurriness of vision Ears, nose, mouth, throat, and face: Denies mucositis or sore throat Respiratory: Denies cough, dyspnea or wheezes Cardiovascular: Denies palpitation, chest discomfort or lower extremity swelling Gastrointestinal:  Denies nausea, heartburn or change in bowel habits Lymphatics: Denies new lymphadenopathy or easy bruising Neurological:Denies numbness, tingling or new weaknesses Behavioral/Psych: Mood is stable, no new changes  All other systems were reviewed with the patient and are negative.  I have reviewed the past medical history, past surgical history, social history and family history with the patient and they are unchanged from previous  note.  ALLERGIES:  is allergic to codeine.  MEDICATIONS:  Current Outpatient Medications  Medication Sig Dispense Refill  . acetaminophen (TYLENOL) 500 MG tablet Take 500 mg by mouth every 6 (six) hours as needed for mild pain, moderate pain or headache.    Marland Kitchen acyclovir (ZOVIRAX) 400 MG tablet TAKE 1 TABLET BY MOUTH TWICE A DAY 180 tablet 0  . ALPRAZolam (XANAX) 0.25 MG tablet Take 0.25 mg by mouth as needed for anxiety.    Marland Kitchen aspirin EC 81 MG tablet Take 1 tablet (81 mg total) by mouth daily.    Marland Kitchen b complex vitamins tablet Take 1 tablet by mouth daily.     . Capsaicin 0.1 % CREA Apply topically. OTC - use as needed for peripheral neuropathy.    . Cholecalciferol (VITAMIN D-1000 MAX ST) 1000 units tablet Take 1,000 Units by mouth daily.     . clotrimazole (LOTRIMIN) 1 % cream Apply 1 application topically 2 (two) times daily.    Marland Kitchen esomeprazole (NEXIUM) 40 MG capsule Take 40 mg by mouth 2 (two) times daily.  0  . fluticasone (FLONASE) 50 MCG/ACT nasal spray Place into the nose.    . folic acid (FOLVITE) 169 MCG tablet Take 400 mcg by mouth daily.     Marland Kitchen HYDROcodone-acetaminophen (NORCO) 10-325 MG tablet take 1 tablet by  mouth every 6 to 8 hours if needed for pain  0  . lidocaine (LIDODERM) 5 % apply patch to affected area once daily as directed for lower back pain  0  . Lidocaine 4 % GEL Apply topically. OTC - use as needed for peripheral neuropathy.    . lidocaine-prilocaine (EMLA) cream APPLY TO AFFECTED AREA ONCE 30 g 3  . meclizine (ANTIVERT) 12.5 MG tablet Take 12.5 mg by mouth every 6 (six) hours as needed for dizziness.   0  . metFORMIN (GLUCOPHAGE-XR) 500 MG 24 hr tablet Take by mouth. Takes one tablet in AM and 2 tablets at night.    . metoprolol succinate (TOPROL-XL) 25 MG 24 hr tablet TAKE 1 TABLET (25 MG TOTAL) BY MOUTH DAILY. 90 tablet 3  . nitroGLYCERIN (NITROSTAT) 0.4 MG SL tablet PLACE 1 TABLET UNDER THE TONGUE EVERY 5 MINUTES X 3 DOSES AS NEEDED FOR CHEST PAIN *MAX 3 DOSES* 25  tablet 3  . nortriptyline (PAMELOR) 25 MG capsule Take 4 capsules (100 mg total) by mouth at bedtime. 120 capsule 11  . predniSONE (DELTASONE) 20 MG tablet Take 1 tablet (20 mg total) by mouth daily with breakfast. 7 tablet 0  . predniSONE (DELTASONE) 20 MG tablet Take 2 tablets (40 mg total) by mouth daily with breakfast for 7 days. 14 tablet 0  . pregabalin (LYRICA) 200 MG capsule 1 cap three times daily and 2 caps at hs 270 capsule 4  . PROVENTIL HFA 108 (90 Base) MCG/ACT inhaler TAKE 2 PUFFS BY MOUTH EVERY 6 HOURS AS NEEDED FOR WHEEZE OR SHORTNESS OF BREATH 20.1 Inhaler 2  . rosuvastatin (CRESTOR) 5 MG tablet TAKE 1 TABLET (5 MG TOTAL) BY MOUTH DAILY. 90 tablet 3  . sildenafil (VIAGRA) 100 MG tablet TAKE 1/2-1 TABLET DAILY AS NEEDED FOR ERECTILE DYSFUNCTION (30 MINUTES PRIOR TO SEXUAL INTERCOURSE) 10 tablet 6  . tetrahydrozoline 0.05 % ophthalmic solution Place 1 drop into both eyes as needed. Red eyes    . triamcinolone ointment (KENALOG) 0.5 % Apply 1 application topically 2 (two) times daily. 30 g 0  . zolpidem (AMBIEN) 10 MG tablet Take 10 mg by mouth at bedtime as needed for sleep.   0   No current facility-administered medications for this visit.    Facility-Administered Medications Ordered in Other Visits  Medication Dose Route Frequency Provider Last Rate Last Dose  . 0.9 %  sodium chloride infusion   Intravenous Continuous Alvy Bimler, Terrill Alperin, MD 50 mL/hr at 03/07/14 1005    . sodium chloride 0.9 % injection 10 mL  10 mL Intracatheter PRN Marcy Panning, MD   10 mL at 08/22/12 1721    PHYSICAL EXAMINATION: ECOG PERFORMANCE STATUS: 1 - Symptomatic but completely ambulatory  Vitals:   09/14/17 0904  BP: 118/65  Pulse: 70  Resp: 18  Temp: 97.7 F (36.5 C)  SpO2: 97%   Filed Weights   09/14/17 0904  Weight: 229 lb (103.9 kg)    GENERAL:alert, no distress and comfortable SKIN: Noted dry skin and diffuse erythema throughout, consistent with drug-induced skin rash EYES: normal,  Conjunctiva are pink and non-injected, sclera clear OROPHARYNX:no exudate, no erythema and lips, buccal mucosa, and tongue normal  NECK: supple, thyroid normal size, non-tender, without nodularity LYMPH:  no palpable lymphadenopathy in the cervical, axillary or inguinal LUNGS: clear to auscultation and percussion with normal breathing effort HEART: regular rate & rhythm and no murmurs and no lower extremity edema ABDOMEN:abdomen soft, non-tender and normal bowel sounds Musculoskeletal:no cyanosis  of digits and no clubbing  NEURO: alert & oriented x 3 with fluent speech, no focal motor/sensory deficits  LABORATORY DATA:  I have reviewed the data as listed    Component Value Date/Time   NA 141 09/14/2017 0835   NA 136 01/26/2017 0940   K 4.2 09/14/2017 0835   K 4.0 01/26/2017 0940   CL 104 09/14/2017 0835   CL 104 08/03/2012 1229   CO2 30 09/14/2017 0835   CO2 23 01/26/2017 0940   GLUCOSE 157 (H) 09/14/2017 0835   GLUCOSE 146 (H) 01/26/2017 0940   GLUCOSE 223 (H) 08/03/2012 1229   BUN 14 09/14/2017 0835   BUN 9.6 01/26/2017 0940   CREATININE 1.19 09/14/2017 0835   CREATININE 1.0 01/26/2017 0940   CALCIUM 8.8 (L) 09/14/2017 0835   CALCIUM 8.7 01/26/2017 0940   PROT 6.9 09/14/2017 0835   PROT 5.9 (L) 04/13/2017 0823   PROT 5.7 (L) 01/26/2017 0940   ALBUMIN 3.3 (L) 09/14/2017 0835   ALBUMIN 3.6 01/26/2017 0940   AST 23 09/14/2017 0835   AST 13 01/26/2017 0940   ALT 22 09/14/2017 0835   ALT 12 01/26/2017 0940   ALKPHOS 102 09/14/2017 0835   ALKPHOS 78 01/26/2017 0940   BILITOT 0.4 09/14/2017 0835   BILITOT 0.64 01/26/2017 0940   GFRNONAA >60 09/14/2017 0835   GFRAA >60 09/14/2017 0835    No results found for: SPEP, UPEP  Lab Results  Component Value Date   WBC 4.2 09/14/2017   NEUTROABS 2.9 09/14/2017   HGB 13.0 09/14/2017   HCT 38.0 (L) 09/14/2017   MCV 95.0 09/14/2017   PLT 66 (L) 09/14/2017      Chemistry      Component Value Date/Time   NA 141 09/14/2017  0835   NA 136 01/26/2017 0940   K 4.2 09/14/2017 0835   K 4.0 01/26/2017 0940   CL 104 09/14/2017 0835   CL 104 08/03/2012 1229   CO2 30 09/14/2017 0835   CO2 23 01/26/2017 0940   BUN 14 09/14/2017 0835   BUN 9.6 01/26/2017 0940   CREATININE 1.19 09/14/2017 0835   CREATININE 1.0 01/26/2017 0940      Component Value Date/Time   CALCIUM 8.8 (L) 09/14/2017 0835   CALCIUM 8.7 01/26/2017 0940   ALKPHOS 102 09/14/2017 0835   ALKPHOS 78 01/26/2017 0940   AST 23 09/14/2017 0835   AST 13 01/26/2017 0940   ALT 22 09/14/2017 0835   ALT 12 01/26/2017 0940   BILITOT 0.4 09/14/2017 0835   BILITOT 0.64 01/26/2017 0940     Picture is taken with permission from the patient      RADIOGRAPHIC STUDIES: I have personally reviewed the radiological images as listed and agreed with the findings in the report. Ir Cv Line Injection  Result Date: 09/01/2017 INDICATION: 67 year old male with a history of burning sensation at the port site of a right IJ port catheter. Port catheter was placed 2014 and has been accessed at least monthly for 5 years. He reports burning sensation at the skin site after our port check and the last time access was performed by the infusion center. EXAM: ACCESS A PORT CATHETER AND PORT CHECK WITH LINE INJECTION MEDICATIONS: None ANESTHESIA/SEDATION: None FLUOROSCOPY TIME:  Fluoroscopy Time: 0 minutes 18 seconds (5.8 mGy). COMPLICATIONS: None PROCEDURE: The patient was advised of the possible risks and complications and agreed to undergo the procedure. The patient was then brought to the angiographic suite for the procedure. Chlorhexidine was used  to prep the port in the usual sterile fashion. A non-coring huber needle was advanced using palpation. Images of the chest were performed before infusion of contrast. Digital subtraction angiography was then performed. The catheter was flushed with standard have block administered and then the needle was removed. Final image was acquired.  Images demonstrate no fracture. No extravasation of contrast. No fibrin sheath identified. After removal of the needle and image, there is no evidence of extraluminal contrast next to the port site. IMPRESSION: Port injection demonstrates no etiology for the patient's pain, with no port complication identified by radiographic interrogation. It is possible the patient has a sensitivity/allergy to the chlorhexidine cleaning prep, and an alternative cleaning agent may be considered at the time of next access. Signed, Dulcy Fanny. Dellia Nims, RPVI [facility] Electronically Signed   By: Corrie Mckusick D.O.   On: 09/01/2017 16:53   Ir Cv Line Injection  Result Date: 08/18/2017 INDICATION: History of CLL post Port a catheter placement by Dr. Kathlene Cote on 06/01/2012. The Port a catheter has functioned well until recently as there has been intermittent difficulty with aspiration and the patient reports pain during the [facility] a catheter injection. As such, patient presents today for [facility] a catheter evaluation and management as well as port injection. EXAM: FLUOROSCOPIC GUIDED PORT A CATHETER CHECK MEDICATIONS: None. CONTRAST:  10 cc Isovue-300 FLUOROSCOPY TIME:  12 seconds (459 mGy) COMPLICATIONS: None immediate. TECHNIQUE: The procedure, risks, benefits, and alternatives were explained to the patient and informed written consent was obtained. A timeout was performed prior to the initiation of the procedure. The patient's chest port a catheter was accessed by the IV team. The patient was placed supine on the fluoroscopy table. A preprocedural spot fluoroscopic image was obtained of the chest in existing port a catheter. Note was made of difficulty aspirating blood from the port a catheter. Contrast was injected via the Port a catheter and images were reviewed. The Port a catheter was flushed with a heparin dwell and de accessed. A dressing was placed. The patient  tolerated the procedure well without immediate postprocedural complication. FINDINGS: Unchanged positioning of right anterior chest wall port a catheter with tip overlying the expected location of the superior cavoatrial junction. Note was made of difficulty aspirating from the [facility] a catheter however contrast injection demonstrates appropriate functionality of the Port a catheter. Following the [name] a catheter injection, the port was noted to easily aspirate and flush. There is no evidence of Port a catheter tubing kink or fracture. There is no evidence of a fibrin sheath about the port a catheter tip. Port a catheter was flushed with saline and a postprocedural x-ray was obtained and was negative for the presence of any extravasated contrast to suggest a reservoir or catheter leak. IMPRESSION: Appropriately positioned and functioning right internal jugular vein approach port a catheter without explanation for patient's intermittent discomfort during Port a catheter injection. Specifically, there is no evidence of Port a catheter reservoir catheter leak. No evidence of a fibrin sheath. Electronically Signed   By: Sandi Mariscal M.D.   On: 08/18/2017 09:35    All questions were answered. The patient knows to call the clinic with any problems, questions or concerns. No barriers to learning was detected.  I spent 15 minutes counseling the patient face to face. The total time spent in the appointment was 20 minutes and more than 50% was on counseling and review of test results  [name], MD  09/15/2017 12:36 PM

## 2017-09-15 NOTE — Assessment & Plan Note (Signed)
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

## 2017-09-18 ENCOUNTER — Encounter: Payer: Self-pay | Admitting: Hematology and Oncology

## 2017-09-18 ENCOUNTER — Other Ambulatory Visit: Payer: Self-pay | Admitting: Hematology and Oncology

## 2017-09-18 MED ORDER — PREDNISONE 20 MG PO TABS: 20.0000 mg | ORAL_TABLET | Freq: Every day | ORAL | 0 refills | Status: DC

## 2017-10-11 ENCOUNTER — Encounter: Payer: Self-pay | Admitting: Hematology and Oncology

## 2017-10-11 ENCOUNTER — Other Ambulatory Visit: Payer: Self-pay | Admitting: Hematology and Oncology

## 2017-10-11 MED ORDER — PREDNISONE 20 MG PO TABS: 20.0000 mg | ORAL_TABLET | Freq: Every day | ORAL | 0 refills | Status: DC

## 2017-11-03 NOTE — Progress Notes (Signed)
[facility]  PATIENT: Ryan Copeland DOB: 1950-12-01   REASON FOR VISIT: Follow-up for peripheral neuropathy HISTORY FROM: Patient    HISTORY OF PRESENT ILLNESS:UPDATE 9/23/2019CM [name], 67 year old male returns for follow-up with history of peripheral neuropathy.  He was last seen in February by Dr. Krista Blue who increased his Lyrica to 1capsule of the 200 mg 3 times daily and 2 capsules at night for a total of thousand milligrams daily.  Repeat EEG 05/05/17 EM[facility]/[place] This is an abnormal study.  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  He continues to see receive IVIG every month from oncology for his B-cell lymphoma.  He reports that his peripheral neuropathy pain is better controlled at present.  He also takes Pamelor 100 mg at bedtime.  He continues to work full-time.  He returns for reevaluation  2/28/19YYMrs. Ryan Copeland has been followed by our clinic for a long time for peripheral neuropathy, previously he was a patient of Dr. Doy Mince, since he left the practice, he was followed by nurse practitioner Hoyle Sauer.  I reviewed his oncologist note from Dr. 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, chronic lymphocytic leukemia, no evidence of solid organ involvement, he received chemotherapy FCR x6 cycles from May 2014 to October 2014, chemotherapy Bendamustine and and rituximab from October 06, 2016 to February 24, 2017,  Most recent CAT scan on March 22, 7900, no hypermetabolic adenopathy identified within the neck, chest, abdomen or pelvic, prominent left retroperitoneal lymph node measuring 1.6 cm without significant FDG uptake, splenomegaly,  He has diabetes since 2012, still works as an Chief Financial Officer,  Is diabetic symptoms started around 2009, he noticed bilateral feet numbness tingling, at the bottom of his feet, actually underwent bilateral tarsal tunnel release surgery did not  help his symptoms, reported abnormal EMG nerve conduction study in the past from outside office, never had a repeat study in the laboratory,  Reviewing the record, previously had evidence of vitamin B12, has been on vitamin B12 IM supplement since,  He has tried and failed gabapentin, Cymbalta, Topamax, currently taking Lyrica 100 mg 3 times a day +200 mg at bedtime, and gabapentin 300 mg twice a day.   Laboratory evaluations in April 2018, triglyceride 201, LDL 79, A1c was 7.2   UPDATE 08/28/2018CM [name], 67 year old male returns for follow-up with long-standing history of polyneuropathy which has worsened recently. He continues to have pain in both feet left worse than right. He has been on Topamax. Medication since last seen in addition he stopped his Metanx. 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 He is currently on prednisone and had his first chemotherapy last week. He remains on Lyrica 500 mg total dose daily. His Lyrica  is very expensive and he was given a co-pay card. He continues to work. He returns for reevaluation.   UPDATE 12/28/2017CM [name], 67 year old male returns for follow-up with history of polyneuropathy which has been long-standing. He continues to have some pain in both feet left worse than the right. Gabapentin was not beneficial for his symptoms. He remains on vitamin B12 injections. He is currently on Lyrica 75 times a day 150 at night. He has failed Cymbalta. He is also on Topamax 75 twice daily. He continues to work full-time. His walking and balance are unchanged. No recent falls.  Epidural injection by Dr. Brien Few last month. 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 He  returns for reevaluation  08/21/15 CMMr. Ryan Copeland, 67 year old male returns for followup. He has a history of polyneuropathy which has been long-standing. He was last seen 01/06/15. He continues to have some pain, numbness and paresthesias in both feet left worse than  right. Sensation is worse in great toes. His feet are cold he also has a burning sensation. His symptoms have not worsened since last seen. Gabapentin was not beneficial for his symptoms. He was found to have a low B6 level and an extremely low vitamin B12 level at 121. He has been on vitamin B12 injections which his wife gives monthly. He is sleeping well. He is currently taking Lyrica 75 mg 3 times a day and two 75 mg at night for a total dose of 375mg . He was placed on Transdermal cream last year, with good results. He has failed Cymbalta. He is also on Topamax. He states his walking and balance are unchanged. He has not fallen.  Had lower back disc injury. ( has had epidural steroid injections, surgery in future.He has CLL and has been receiving IVIG. He returns for reevaluation  and refills   REVIEW OF SYSTEMS: Full 14 system review of systems performed and notable only for those listed, all others are neg:  Constitutional: neg  Cardiovascular: neg Ear/Nose/Throat: neg  Skin: neg Eyes: neg Respiratory: neg Gastroitestinal: neg  Hematology/Lymphatic: neg  Endocrine: neg Musculoskeletal: Back pain , foot pain paresthesias Allergy/Immunology: neg Neurological: neg Psychiatric: neg Sleep : neg   ALLERGIES: Allergies  Allergen Reactions  . Codeine Hives    Pt states he can take a few, more reaction with extended doses.    HOME MEDICATIONS: Outpatient Medications Prior to Visit  Medication Sig Dispense Refill  . acetaminophen (TYLENOL) 500 MG tablet Take 500 mg by mouth every 6 (six) hours as needed for mild pain, moderate pain or headache.    Marland Kitchen acyclovir (ZOVIRAX) 400 MG tablet TAKE 1 TABLET BY MOUTH TWICE A DAY 180 tablet 0  . ALPRAZolam (XANAX) 0.25 MG tablet Take 0.25 mg by mouth as needed for anxiety.    Marland Kitchen aspirin EC 81 MG tablet Take 1 tablet (81 mg total) by mouth daily.    Marland Kitchen b complex vitamins tablet Take 1 tablet by mouth daily.     . Capsaicin 0.1 % CREA Apply topically.  OTC - use as needed for peripheral neuropathy.    . Cholecalciferol (VITAMIN D-1000 MAX ST) 1000 units tablet Take 1,000 Units by mouth daily.     Marland Kitchen esomeprazole (NEXIUM) 40 MG capsule Take 40 mg by mouth 2 (two) times daily.  0  . fluticasone (FLONASE) 50 MCG/ACT nasal spray Place into the nose.    . folic acid (FOLVITE) 147 MCG tablet Take 400 mcg by mouth daily.     Marland Kitchen HYDROcodone-acetaminophen (NORCO) 10-325 MG tablet take 1 tablet by mouth every 6 to 8 hours if needed for pain  0  . lidocaine (LIDODERM) 5 % apply patch to affected area once daily as directed for lower back pain  0  . Lidocaine 4 % GEL Apply topically. OTC - use as needed for peripheral neuropathy.    . lidocaine-prilocaine (EMLA) cream APPLY TO AFFECTED AREA ONCE 30 g 3  . meclizine (ANTIVERT) 12.5 MG tablet Take 12.5 mg by mouth every 6 (six) hours as needed for dizziness.   0  . metFORMIN (GLUCOPHAGE-XR) 500 MG 24 hr tablet Take by mouth. Takes one tablet in AM and 2 tablets at night.    Marland Kitchen  metoprolol succinate (TOPROL-XL) 25 MG 24 hr tablet TAKE 1 TABLET (25 MG TOTAL) BY MOUTH DAILY. 90 tablet 3  . nitroGLYCERIN (NITROSTAT) 0.4 MG SL tablet PLACE 1 TABLET UNDER THE TONGUE EVERY 5 MINUTES X 3 DOSES AS NEEDED FOR CHEST PAIN *MAX 3 DOSES* 25 tablet 3  . nortriptyline (PAMELOR) 25 MG capsule Take 4 capsules (100 mg total) by mouth at bedtime. 120 capsule 11  . pregabalin (LYRICA) 200 MG capsule 1 cap three times daily and 2 caps at hs 270 capsule 4  . PROVENTIL HFA 108 (90 Base) MCG/ACT inhaler TAKE 2 PUFFS BY MOUTH EVERY 6 HOURS AS NEEDED FOR WHEEZE OR SHORTNESS OF BREATH 20.1 Inhaler 2  . rosuvastatin (CRESTOR) 5 MG tablet TAKE 1 TABLET (5 MG TOTAL) BY MOUTH DAILY. 90 tablet 3  . sildenafil (VIAGRA) 100 MG tablet TAKE 1/2-1 TABLET DAILY AS NEEDED FOR ERECTILE DYSFUNCTION (30 MINUTES PRIOR TO SEXUAL INTERCOURSE) 10 tablet 6  . tetrahydrozoline 0.05 % ophthalmic solution Place 1 drop into both eyes as needed. Red eyes    .  zolpidem (AMBIEN) 10 MG tablet Take 10 mg by mouth at bedtime as needed for sleep.   0  . clotrimazole (LOTRIMIN) 1 % cream Apply 1 application topically 2 (two) times daily.    . predniSONE (DELTASONE) 20 MG tablet Take 1 tablet (20 mg total) by mouth daily with breakfast. 7 tablet 0  . triamcinolone ointment (KENALOG) 0.5 % Apply 1 application topically 2 (two) times daily. 30 g 0   Facility-Administered Medications Prior to Visit  Medication Dose Route Frequency Provider Last Rate Last Dose  . 0.9 %  sodium chloride infusion   Intravenous Continuous Alvy Bimler, Ni, MD 50 mL/hr at 03/07/14 1005    . sodium chloride 0.9 % injection 10 mL  10 mL Intracatheter PRN Marcy Panning, MD   10 mL at 08/22/12 1721    PAST MEDICAL HISTORY: Past Medical History:  Diagnosis Date  . Anxiety 01/30/2014  . Back injury    lower disc  . CAD (coronary artery disease)   . CLL (chronic lymphocytic leukemia) ([place]) 03/18/2011  . Diabetes mellitus ([place])    Type 2   . ECRB (extensor carpi radialis brevis) tenosynovitis   . GERD (gastroesophageal reflux disease)    takes Nexium if needed  . Hyperlipidemia   . Lateral epicondylitis of left elbow   . MI, acute, non ST segment elevation ([place]) 06/28/2009   with stenting of the LAD  . Neuromuscular disorder ([place])    peripheral neuropathy  . PVD (peripheral vascular disease) ([place])   . Tobacco abuse     PAST SURGICAL HISTORY: Past Surgical History:  Procedure Laterality Date  . ADENOIDECTOMY  1955  . CARDIAC CATHETERIZATION    . CARDIAC CATHETERIZATION N/A 09/26/2014   Procedure: Left Heart Cath and Coronary Angiography;  Surgeon: Peter M Martinique, MD;  Location: [place] [facility];  Service: Cardiovascular;  Laterality: N/A;  . carpel tunnel release Left 04-1989  . carpel tunnel release  Right 01-1989  . CHOLECYSTECTOMY  2007  . CORONARY STENT PLACEMENT  May 2011  . femoral stents    . IR CV LINE INJECTION  08/18/2017  . IR CV LINE INJECTION  09/01/2017  .  LATERAL EPICONDYLE RELEASE Left 02/12/2014   Procedure: LEFT ELBOW DEBRIDEMENT WITH TENDON REPAIR ;  Surgeon: Lorn Junes, MD;  Location: [facility];  Service: Orthopedics;  Laterality: Left;  . LEFT CAI STENT/PTA AND POPLITEAL ARTERY/TIBIAL THROMBECTOMY     .  LEFT HEART CATHETERIZATION WITH CORONARY ANGIOGRAM N/A 08/26/2011   Procedure: LEFT HEART CATHETERIZATION WITH CORONARY ANGIOGRAM;  Surgeon: Peter M Martinique, MD;  Location: [facility];  Service: Cardiovascular;  Laterality: N/A;  . PERIPHERAL VASCULAR CATHETERIZATION N/A 01/01/2015   Procedure: Abdominal Aortogram;  Surgeon: Conrad Watchung, MD;  Location: [place] [facility];  Service: Cardiovascular;  Laterality: N/A;  . TARSAL TUNNEL RELEASE Bilateral 08-2007    FAMILY HISTORY: Family History  Problem Relation Age of Onset  . Lung cancer Mother 37  . Cancer Mother        lung  . Heart failure Father 56  . Heart disease Father     SOCIAL HISTORY: Social History   Socioeconomic History  . Marital status: Married    Spouse name: [name]  . Number of children: 1  . Years of education: College  . Highest education level: Not on file  Occupational History    Employer: [employer]    Comment: [employer]  Social Needs  . Financial resource strain: Not on file  . Food insecurity:    Worry: Not on file    Inability: Not on file  . Transportation needs:    Medical: Not on file    Non-medical: Not on file  Tobacco Use  . Smoking status: Former Smoker    Packs/day: 0.50    Years: 44.00    Pack years: 22.00    Types: Cigarettes    Last attempt to quit: 04/23/2016    Years since quitting: 1.5  . Smokeless tobacco: Former Systems developer  . Tobacco comment: but does have ocassional relapses  Substance and Sexual Activity  . Alcohol use: No    Alcohol/week: 0.0 standard drinks    Comment:  drinks non-alcoholic beer  . Drug use: No  . Sexual activity: Yes  Lifestyle  . Physical activity:    Days per week: Not on file     Minutes per session: Not on file  . Stress: Not on file  Relationships  . Social connections:    Talks on phone: Not on file    Gets together: Not on file    Attends religious service: Not on file    Active member of club or organization: Not on file    Attends meetings of clubs or organizations: Not on file    Relationship status: Not on file  . Intimate partner violence:    Fear of current or ex partner: Not on file    Emotionally abused: Not on file    Physically abused: Not on file    Forced sexual activity: Not on file  Other Topics Concern  . Not on file  Social History Narrative   Patient lives at home with wife.   Caffeine Use: 15 cups of caffeine weekly     PHYSICAL EXAM  Vitals:   11/06/17 0749  BP: 117/65  Pulse: 70  Weight: 224 lb (101.6 kg)  Height: 6' (1.829 m)   Body mass index is 30.38 kg/m. Generalized: Well developed, in no acute distress  Head: normocephalic and atraumatic,. Oropharynx benign  Neck: Supple,   Musculoskeletal: No deformity   Neurological examination   Mentation: Alert oriented to time, place, history taking. Follows all commands speech and language fluent  Cranial nerve II-XII: Pupils were equal round reactive to light extraocular movements were full, visual field were full on confrontational test. Facial sensation and strength were normal. hearing was intact to finger rubbing bilaterally. Uvula tongue midline. head turning  and shoulder shrug were normal and symmetric.Tongue protrusion into cheek strength was normal. Motor: normal bulk and tone, full strength in the BUE, BLE, fine finger movements normal, no pronator drift. No focal weakness Sensory: Diminished pinprick to the ankle, normal vibratory to the toes, position sense is normal  Coordination: finger-nose-finger, heel-to-shin bilaterally, no dysmetria Reflexes: Diminished upper and lower and symmetric, plantar responses were flexor bilaterally. Gait and Station: Rising  up from seated position without assistance, normal stance, moderate stride, good arm swing, smooth turning, able to perform tiptoe, and heel walking without difficulty. Tandem gait is  unsteady. No assistive device  DIAGNOSTIC DATA (LABS, IMAGING, TESTING) - I reviewed patient records, labs, notes, testing and imaging myself where available.  Lab Results  Component Value Date   WBC 4.2 09/14/2017   HGB 13.0 09/14/2017   HCT 38.0 (L) 09/14/2017   MCV 95.0 09/14/2017   PLT 66 (L) 09/14/2017      Component Value Date/Time   NA 141 09/14/2017 0835   NA 136 01/26/2017 0940   K 4.2 09/14/2017 0835   K 4.0 01/26/2017 0940   CL 104 09/14/2017 0835   CL 104 08/03/2012 1229   CO2 30 09/14/2017 0835   CO2 23 01/26/2017 0940   GLUCOSE 157 (H) 09/14/2017 0835   GLUCOSE 146 (H) 01/26/2017 0940   GLUCOSE 223 (H) 08/03/2012 1229   BUN 14 09/14/2017 0835   BUN 9.6 01/26/2017 0940   CREATININE 1.19 09/14/2017 0835   CREATININE 1.0 01/26/2017 0940   CALCIUM 8.8 (L) 09/14/2017 0835   CALCIUM 8.7 01/26/2017 0940   PROT 6.9 09/14/2017 0835   PROT 5.9 (L) 04/13/2017 0823   PROT 5.7 (L) 01/26/2017 0940   ALBUMIN 3.3 (L) 09/14/2017 0835   ALBUMIN 3.6 01/26/2017 0940   AST 23 09/14/2017 0835   AST 13 01/26/2017 0940   ALT 22 09/14/2017 0835   ALT 12 01/26/2017 0940   ALKPHOS 102 09/14/2017 0835   ALKPHOS 78 01/26/2017 0940   BILITOT 0.4 09/14/2017 0835   BILITOT 0.64 01/26/2017 0940   GFRNONAA >60 09/14/2017 0835   GFRAA >60 09/14/2017 0835   Lab Results  Component Value Date   CHOL 146 05/24/2016   HDL 27 (L) 05/24/2016   LDLCALC 79 05/24/2016   LDLDIRECT 117.6 08/22/2011   TRIG 201 (H) 05/24/2016   CHOLHDL 5.4 (H) 05/24/2016   Lab Results  Component Value Date   HGBA1C 6.2 (H) 04/13/2017        ASSESSMENT AND PLAN 67 y.o. year old male with history of peripheral neuropathy with slow progression for years, patient has a previous history of chemotherapy,  Potential  etiologies are diabetes, previous chemotherapy, Repeat EEG 05/05/17 EM[facility]/[place] This is an abnormal study.  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                           PLAN: Continue Lyrica  200mg   3 times daily and 2 at bedtime  Continue Nortriptyline 25mg  4 at bedtime Continue Lidocaine topically Continue Capsaicin as needed topically Follow-up in 6 months,  Dennie Bible, [facility], [facility], [facility] [address] [place], [place] [address] [phone]

## 2017-11-06 ENCOUNTER — Encounter: Payer: Self-pay | Admitting: Nurse Practitioner

## 2017-11-06 ENCOUNTER — Ambulatory Visit (INDEPENDENT_AMBULATORY_CARE_PROVIDER_SITE_OTHER): Payer: BLUE CROSS/BLUE SHIELD | Admitting: Nurse Practitioner

## 2017-11-06 VITALS — BP 117/65 | HR 70 | Ht 72.0 in | Wt 224.0 lb

## 2017-11-06 DIAGNOSIS — G619 Inflammatory polyneuropathy, unspecified: Secondary | ICD-10-CM

## 2017-11-06 DIAGNOSIS — G622 Polyneuropathy due to other toxic agents: Secondary | ICD-10-CM | POA: Diagnosis not present

## 2017-11-06 DIAGNOSIS — G629 Polyneuropathy, unspecified: Secondary | ICD-10-CM

## 2017-11-06 DIAGNOSIS — I259 Chronic ischemic heart disease, unspecified: Secondary | ICD-10-CM

## 2017-11-06 NOTE — Patient Instructions (Signed)
Continue Lyrica  200mg   3 times daily and 2 at bedtime  Continue Nortriptyline 25mg  4 at bedtime Follow-up in 6 months,

## 2017-11-09 ENCOUNTER — Other Ambulatory Visit: Payer: Self-pay | Admitting: Neurology

## 2017-11-18 ENCOUNTER — Other Ambulatory Visit: Payer: Self-pay | Admitting: Hematology and Oncology

## 2017-11-18 DIAGNOSIS — C9110 Chronic lymphocytic leukemia of B-cell type not having achieved remission: Secondary | ICD-10-CM

## 2017-11-19 ENCOUNTER — Other Ambulatory Visit: Payer: Self-pay | Admitting: Nurse Practitioner

## 2017-11-24 ENCOUNTER — Encounter: Payer: Self-pay | Admitting: Nurse Practitioner

## 2017-12-11 ENCOUNTER — Ambulatory Visit (INDEPENDENT_AMBULATORY_CARE_PROVIDER_SITE_OTHER): Payer: BLUE CROSS/BLUE SHIELD | Admitting: Nurse Practitioner

## 2017-12-11 ENCOUNTER — Encounter: Payer: Self-pay | Admitting: Nurse Practitioner

## 2017-12-11 VITALS — BP 110/64 | HR 65 | Ht 72.0 in | Wt 224.8 lb

## 2017-12-11 DIAGNOSIS — I10 Essential (primary) hypertension: Secondary | ICD-10-CM

## 2017-12-11 DIAGNOSIS — I739 Peripheral vascular disease, unspecified: Secondary | ICD-10-CM

## 2017-12-11 DIAGNOSIS — E7800 Pure hypercholesterolemia, unspecified: Secondary | ICD-10-CM | POA: Diagnosis not present

## 2017-12-11 DIAGNOSIS — R609 Edema, unspecified: Secondary | ICD-10-CM

## 2017-12-11 DIAGNOSIS — I259 Chronic ischemic heart disease, unspecified: Secondary | ICD-10-CM

## 2017-12-11 MED ORDER — FUROSEMIDE 20 MG PO TABS: 20.0000 mg | ORAL_TABLET | Freq: Every day | ORAL | 3 refills | Status: DC | PRN

## 2017-12-11 NOTE — Progress Notes (Signed)
CARDIOLOGY OFFICE NOTE  Date:  12/11/2017    Ryan Copeland Date of Birth: October 21, 1950 Medical Record #[id number]  PCP:  [name]., PA-C  Cardiologist:  Servando Snare Nahser   Chief Complaint  Patient presents with  . Coronary Artery Disease  . Edema    6 month check - seen for Dr. Acie Fredrickson    History of Present Illness: Ryan Copeland is a 67 y.o. male who presents today for a 6 month check. Seen for Dr. Acie Fredrickson. Former patient of Dr. Susa Simmonds. Primarily follows with me.   Has known prior NSTEMI and stenting of the LAD in 2011. Underwent repeat cath due to refractory symptoms back in August of 2016 - managed medically. 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 He sees neurology for his peripheral neuropathy.   Seen back in Septemberof 2017- had gained weight. Some intermittent chest heaviness - no response with NTG but I added Imdur to his regimen.When seenback in October - still with atypical chest pain - felt more like a pulled muscle but no relief with NSAID. He thought the Imdur had helped however. I talked with Dr. Acie Fredrickson - wanted to avoid Myoview - most likely it was felt to turn out abnormal. Elected to just follow for now.[facility]- this was felt to be possibly causing some of his prior chest pain -turned out to be a yeast infection and with treatment his chest pain resolved. We got him offImdur so he could resume Viagra. Still struggling with trying to stop smoking. He did have to go back on treatment for his CLL in 2018.   Last seen by me back in April and he was doing well. Was considering full dental extraction with general anesthesia and needed clearance. Cardiac status was ok.   Comes back today. Here alone.He had a reaction to his infusion 3 months ago - his brand of IVIG has been changed.  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 He was treated with pretty hefty dose of Prednisone for a very significant rash (picture from heme/onc noted from 09/2017). Still with some swelling of the lower legs. It does not go down overnight. Not using support stockings. Does not sound like he gets too much salt - not a lot of eating out. His weight is up. He does not feel short of breath. No chest pain. Not smoking. He had labs with his PCP earlier this month and those are reviewed.   Past Medical History:  Diagnosis Date  . Anxiety 01/30/2014  . Back injury    lower disc  . CAD (coronary artery disease)   . CLL (chronic lymphocytic leukemia) ([place]) 03/18/2011  . Diabetes mellitus ([place])    Type 2   . ECRB (extensor carpi radialis brevis) tenosynovitis   . GERD (gastroesophageal reflux disease)    takes Nexium if needed  . Hyperlipidemia   . Lateral epicondylitis of left elbow   . MI, acute, non ST segment elevation ([place]) 06/28/2009   with stenting of the LAD  . Neuromuscular disorder ([place])    peripheral neuropathy  . PVD (peripheral vascular disease) ([place])   . Tobacco abuse     Past Surgical History:  Procedure Laterality Date  . ADENOIDECTOMY  1955  . CARDIAC CATHETERIZATION    . CARDIAC CATHETERIZATION  N/A 09/26/2014   Procedure: Left Heart Cath and Coronary Angiography;  Surgeon: Peter M Martinique, MD;  Location: [facility];  Service: Cardiovascular;  Laterality: N/A;  . carpel tunnel release Left 04-1989  . carpel tunnel release  Right 01-1989  . CHOLECYSTECTOMY  2007  . CORONARY STENT PLACEMENT  May 2011  . femoral stents    . IR CV LINE INJECTION  08/18/2017  . IR CV LINE INJECTION  09/01/2017  . LATERAL EPICONDYLE RELEASE Left 02/12/2014   Procedure: LEFT ELBOW DEBRIDEMENT WITH TENDON REPAIR ;  Surgeon: Lorn Junes, MD;  Location: [facility];  Service: Orthopedics;  Laterality: Left;  . LEFT CAI STENT/PTA AND POPLITEAL  ARTERY/TIBIAL THROMBECTOMY     . LEFT HEART CATHETERIZATION WITH CORONARY ANGIOGRAM N/A 08/26/2011   Procedure: LEFT HEART CATHETERIZATION WITH CORONARY ANGIOGRAM;  Surgeon: Peter M Martinique, MD;  Location: [facility];  Service: Cardiovascular;  Laterality: N/A;  . PERIPHERAL VASCULAR CATHETERIZATION N/A 01/01/2015   Procedure: Abdominal Aortogram;  Surgeon: Conrad Newington, MD;  Location: [facility];  Service: Cardiovascular;  Laterality: N/A;  . TARSAL TUNNEL RELEASE Bilateral 08-2007     Medications: Current Meds  Medication Sig  . acetaminophen (TYLENOL) 500 MG tablet Take 500 mg by mouth every 6 (six) hours as needed for mild pain, moderate pain or headache.  Marland Kitchen acyclovir (ZOVIRAX) 400 MG tablet TAKE 1 TABLET BY MOUTH TWICE A DAY  . ALPRAZolam (XANAX) 0.25 MG tablet Take 0.25 mg by mouth as needed for anxiety.  Marland Kitchen aspirin EC 81 MG tablet Take 1 tablet (81 mg total) by mouth daily.  Marland Kitchen b complex vitamins tablet Take 1 tablet by mouth daily.   . benzonatate (TESSALON) 100 MG capsule Take 100 mg by mouth as needed for cough.   . Capsaicin 0.1 % CREA Apply topically. OTC - use as needed for peripheral neuropathy.  . Cholecalciferol (VITAMIN D-1000 MAX ST) 1000 units tablet Take 1,000 Units by mouth daily.   Marland Kitchen esomeprazole (NEXIUM) 40 MG capsule Take 40 mg by mouth 2 (two) times daily.  . fluticasone (FLONASE) 50 MCG/ACT nasal spray Place into the nose.  . folic acid (FOLVITE) 938 MCG tablet Take 400 mcg by mouth daily.   Marland Kitchen HYDROcodone-acetaminophen (NORCO) 10-325 MG tablet take 1 tablet by mouth every 6 to 8 hours if needed for pain  . lidocaine (LIDODERM) 5 % apply patch to affected area once daily as directed for lower back pain  . Lidocaine 4 % GEL Apply topically. OTC - use as needed for peripheral neuropathy.  . lidocaine-prilocaine (EMLA) cream APPLY TO AFFECTED AREA ONCE  . meclizine (ANTIVERT) 12.5 MG tablet Take 12.5 mg by mouth every 6 (six) hours as needed for dizziness.   .  metFORMIN (GLUCOPHAGE-XR) 500 MG 24 hr tablet Take by mouth. Takes one tablet in AM and 2 tablets at night.  . metoprolol succinate (TOPROL-XL) 25 MG 24 hr tablet TAKE 1 TABLET (25 MG TOTAL) BY MOUTH DAILY.  . nitroGLYCERIN (NITROSTAT) 0.4 MG SL tablet PLACE 1 TABLET UNDER THE TONGUE EVERY 5 MINUTES X 3 DOSES AS NEEDED FOR CHEST PAIN *MAX 3 DOSES*  . nortriptyline (PAMELOR) 25 MG capsule Take 4 capsules (100 mg total) by mouth at bedtime.  . pregabalin (LYRICA) 200 MG capsule TAKE 1 CAPSULE BY MOUTH THREE TIMES A DAY AND 2 CAPSULES AT BEDTIME  . PROVENTIL HFA 108 (90 Base) MCG/ACT inhaler TAKE 2 PUFFS BY MOUTH EVERY 6 HOURS AS NEEDED  FOR WHEEZE OR SHORTNESS OF BREATH  . rosuvastatin (CRESTOR) 5 MG tablet TAKE 1 TABLET (5 MG TOTAL) BY MOUTH DAILY.  . sildenafil (VIAGRA) 100 MG tablet TAKE 1/2-1 TABLET DAILY AS NEEDED FOR ERECTILE DYSFUNCTION (30 MINUTES PRIOR TO SEXUAL INTERCOURSE)  . tetrahydrozoline 0.05 % ophthalmic solution Place 1 drop into both eyes as needed. Red eyes  . zolpidem (AMBIEN) 10 MG tablet Take 10 mg by mouth at bedtime as needed for sleep.      Allergies: Allergies  Allergen Reactions  . Codeine Hives    Pt states he can take a few, more reaction with extended doses.    Social History: The patient  reports that he quit smoking about 19 months ago. His smoking use included cigarettes. He has a 22.00 pack-year smoking history. He has quit using smokeless tobacco. He reports that he does not drink alcohol or use drugs.   Family History: The patient's family history includes Cancer in his mother; Heart disease in his father; Heart failure (age of onset: 81) in his father; Lung cancer (age of onset: 19) in his mother.   Review of Systems: Please see the history of present illness.   Otherwise, the review of systems is positive for none.   All other systems are reviewed and negative.   Physical Exam: VS:  BP 110/64   Pulse 65   Ht 6' (1.829 m)   Wt 224 lb 12.8 oz (102  kg)   SpO2 97%   BMI 30.49 kg/m  .  BMI Body mass index is 30.49 kg/m.  Wt Readings from Last 3 Encounters:  12/11/17 224 lb 12.8 oz (102 kg)  11/06/17 224 lb (101.6 kg)  09/14/17 229 lb (103.9 kg)    General: Pleasant. Well developed, well nourished and in no acute distress.  His weight is up from 216# 6 months ago.  HEENT: Normal.  Neck: Supple, no JVD, carotid bruits, or masses noted.  Cardiac: Regular rate and rhythm. No murmurs, rubs, or gallops. +1 edema. Lower legs are quite dry/scaly.  Respiratory:  Lungs are clear to auscultation bilaterally with normal work of breathing.  GI: Soft and nontender.  MS: No deformity or atrophy. Gait and ROM intact.  Skin: Warm and dry. Color is normal.  Neuro:  Strength and sensation are intact and no gross focal deficits noted.  Psych: Alert, appropriate and with normal affect.   LABORATORY DATA:  EKG:  EKG is not ordered today.  Lab Results  Component Value Date   WBC 4.2 09/14/2017   HGB 13.0 09/14/2017   HCT 38.0 (L) 09/14/2017   PLT 66 (L) 09/14/2017   GLUCOSE 157 (H) 09/14/2017   CHOL 146 05/24/2016   TRIG 201 (H) 05/24/2016   HDL 27 (L) 05/24/2016   LDLDIRECT 117.6 08/22/2011   LDLCALC 79 05/24/2016   ALT 22 09/14/2017   AST 23 09/14/2017   NA 141 09/14/2017   K 4.2 09/14/2017   CL 104 09/14/2017   CREATININE 1.19 09/14/2017   BUN 14 09/14/2017   CO2 30 09/14/2017   TSH 1.620 04/13/2017   INR 1.0 09/22/2014   HGBA1C 6.2 (H) 04/13/2017     BNP (last 3 results) No results for input(s): BNP in the last 8760 hours.  ProBNP (last 3 results) No results for input(s): PROBNP in the last 8760 hours.   Other Studies Reviewed Today:  Coronary angiography from 09/2014:  Coronary dominance: right  Left mainstem: Normal.  Left anterior descending (LAD): The stent in  the proximal LAD is widely patent throughout. There is moderate 20% narrowing in the LAD proximal to the stent. The remainder of the vessels without  significant disease.  Left circumflex (LCx): The left circumflex gives rise to 2 large marginal branches. There is 20% narrowing prior to the takeoff of the first OM.  Right coronary artery (RCA): The right coronary is a dominant vessel. It has diffuse 70% disease in the proximal to mid vessel. Is occluded at the crux. There are excellent right to right and left to right collaterals.  Left ventriculography: Left ventricular systolic function is normal, LVEF is estimated at 55-65%, there is no significant mitral regurgitation  Final Conclusions:  1. Single vessel obstructive coronary disease. 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 The stent in the proximal LAD is widely patent.  2. Normal LV function.  Recommendations: Continue medical management.  Peter Martinique  08/26/2011, 9:09 AM   Assessment / Plan:  1. CAD - last cath with stable findings back in August of 2016. He has a chronic total occlusion of the RCA with good collateral flow - stent in the proximal LAD was patent. He is managed medically. He has no chest pain and is not short of breath. No changes made today.   2. Swelling - may still be from the IGIG treatment - his brand has been changed per his report. Will let him use Lasix 20 mg prn. Knee high support stockings as well. Salt restriction advised. If he needs to start taking more routinely, I need to know.   3. HTN - his BP looks good. No changes made today.   4. HLD - on statin therapy -labs from earlier this month noted. No changes made.   5. Tobacco abuse -he continues to not smoke.   6. CLL/chronic thrombocytopenia - followed by oncology/hematology.   7. NIDDM - per PCP  8. Obesity -needs CV risk factor modification.   9. Low testosterone level - with his known CAD - I would prefer he not be on therapy for this.Not discussed at today's visit.   10. PAD with prior L CIA PTA+S, L pop artery  thrombectomy by Dr. Amedeo Plenty - has not seen [facility] since 01/2016 - he has no complaints of claudication. He cancelled his follow up there for 02/2018 - he may reschedule - he is considering.   Current medicines are reviewed with the patient today.  The patient does not have concerns regarding medicines other than what has been noted above.  The following changes have been made:  See above.  Labs/ tests ordered today include:   No orders of the defined types were placed in this encounter.    Disposition:   FU with me in 6 months.   Patient is agreeable to this plan and will call if any problems develop in the interim.   SignedTruitt Merle, NP  12/11/2017 8:27 AM  [facility] [address] [place],   [address] Phone: [phone] Fax: [phone]

## 2017-12-11 NOTE — Patient Instructions (Signed)
We will be checking the following labs today - NONE    Medication Instructions:    Continue with your current medicines. BUT  I am sending in a RX for Lasix 20 mg to take for the next 2 to 3 days and then just as needed for swelling   If you need a refill on your cardiac medications before your next appointment, please call your pharmacy.     Testing/Procedures To Be Arranged:  N/A  Follow-Up:   See me in 6 months    At [facility], you and your health needs are our priority.  As part of our continuing mission to provide you with exceptional heart care, we have created designated Provider Care Teams.  These Care Teams include your primary Cardiologist (physician) and Advanced Practice Providers (APPs -  Physician Assistants and Nurse Practitioners) who all work together to provide you with the care you need, when you need it.  Special Instructions:  . Knee high support stockings  Call the [place] office at [phone] if you have any questions, problems or concerns.

## 2017-12-20 ENCOUNTER — Other Ambulatory Visit: Payer: Self-pay | Admitting: Neurology

## 2017-12-21 ENCOUNTER — Other Ambulatory Visit: Payer: Self-pay | Admitting: Hematology and Oncology

## 2017-12-21 DIAGNOSIS — C9110 Chronic lymphocytic leukemia of B-cell type not having achieved remission: Secondary | ICD-10-CM

## 2018-01-15 ENCOUNTER — Other Ambulatory Visit: Payer: Self-pay | Admitting: Hematology and Oncology

## 2018-01-15 ENCOUNTER — Telehealth: Payer: Self-pay | Admitting: *Deleted

## 2018-01-15 DIAGNOSIS — C8308 Small cell B-cell lymphoma, lymph nodes of multiple sites: Secondary | ICD-10-CM

## 2018-01-15 NOTE — Telephone Encounter (Signed)
Dr Thornton D[facility] office called to let Dr Alvy Bimler know that they did the extractions on Mr Ryan Copeland and he tolerated procedure well.

## 2018-01-26 ENCOUNTER — Other Ambulatory Visit: Payer: Self-pay | Admitting: Hematology and Oncology

## 2018-01-26 ENCOUNTER — Telehealth: Payer: Self-pay | Admitting: *Deleted

## 2018-01-26 DIAGNOSIS — Z95828 Presence of other vascular implants and grafts: Secondary | ICD-10-CM

## 2018-01-26 NOTE — Telephone Encounter (Signed)
I put an order for IR to eval Please call for appt next week

## 2018-01-26 NOTE — Telephone Encounter (Signed)
Home Health nurse called to notify Dr. Alvy Bimler port flushes with ease but no blood return was noted. No labs are able to be drawn today.   Per Dr. Alvy Bimler ok to use port today and this office will coordinate to have a dye study completed. Patient has lab appointment set in this office on Friday. He is willing to come in early if needed.

## 2018-01-29 ENCOUNTER — Telehealth: Payer: Self-pay

## 2018-01-29 NOTE — Telephone Encounter (Signed)
Called and left a message forTiffany to call patient to schedule appt to have port evaluated.

## 2018-01-30 ENCOUNTER — Telehealth (HOSPITAL_COMMUNITY): Payer: Self-pay

## 2018-01-30 NOTE — Telephone Encounter (Signed)
Called to schedule port check, no answer, left vm. AW

## 2018-02-02 ENCOUNTER — Ambulatory Visit (HOSPITAL_COMMUNITY)
Admission: RE | Admit: 2018-02-02 | Discharge: 2018-02-02 | Disposition: A | Discharge status: Home / Self Care | Payer: BLUE CROSS/BLUE SHIELD | Source: Ambulatory Visit | Attending: Hematology and Oncology | Admitting: Hematology and Oncology

## 2018-02-02 ENCOUNTER — Other Ambulatory Visit: Payer: Self-pay | Admitting: Hematology and Oncology

## 2018-02-02 ENCOUNTER — Telehealth: Payer: Self-pay | Admitting: Hematology and Oncology

## 2018-02-02 ENCOUNTER — Encounter (HOSPITAL_COMMUNITY): Payer: Self-pay | Admitting: Interventional Radiology

## 2018-02-02 ENCOUNTER — Ambulatory Visit: Payer: Self-pay | Admitting: Hematology and Oncology

## 2018-02-02 ENCOUNTER — Other Ambulatory Visit: Payer: Self-pay

## 2018-02-02 ENCOUNTER — Inpatient Hospital Stay: Payer: BLUE CROSS/BLUE SHIELD | Attending: Hematology and Oncology

## 2018-02-02 ENCOUNTER — Inpatient Hospital Stay (HOSPITAL_BASED_OUTPATIENT_CLINIC_OR_DEPARTMENT_OTHER): Payer: BLUE CROSS/BLUE SHIELD | Admitting: Hematology and Oncology

## 2018-02-02 DIAGNOSIS — C9110 Chronic lymphocytic leukemia of B-cell type not having achieved remission: Secondary | ICD-10-CM | POA: Insufficient documentation

## 2018-02-02 DIAGNOSIS — Y831 Surgical operation with implant of artificial internal device as the cause of abnormal reaction of the patient, or of later complication, without mention of misadventure at the time of the procedure: Secondary | ICD-10-CM | POA: Insufficient documentation

## 2018-02-02 DIAGNOSIS — T85618A Breakdown (mechanical) of other specified internal prosthetic devices, implants and grafts, initial encounter: Secondary | ICD-10-CM | POA: Insufficient documentation

## 2018-02-02 DIAGNOSIS — D801 Nonfamilial hypogammaglobulinemia: Secondary | ICD-10-CM | POA: Insufficient documentation

## 2018-02-02 DIAGNOSIS — D696 Thrombocytopenia, unspecified: Secondary | ICD-10-CM | POA: Diagnosis not present

## 2018-02-02 DIAGNOSIS — Z95828 Presence of other vascular implants and grafts: Secondary | ICD-10-CM

## 2018-02-02 DIAGNOSIS — Z79899 Other long term (current) drug therapy: Secondary | ICD-10-CM | POA: Diagnosis not present

## 2018-02-02 DIAGNOSIS — C8308 Small cell B-cell lymphoma, lymph nodes of multiple sites: Secondary | ICD-10-CM

## 2018-02-02 DIAGNOSIS — Z9221 Personal history of antineoplastic chemotherapy: Secondary | ICD-10-CM | POA: Insufficient documentation

## 2018-02-02 HISTORY — PX: IR CV LINE INJECTION: IMG2294

## 2018-02-02 LAB — CBC WITH DIFFERENTIAL/PLATELET
Abs Immature Granulocytes: 0 10*3/uL (ref 0.00–0.07)
Basophils Absolute: 0 10*3/uL (ref 0.0–0.1)
Basophils Relative: 0 %
EOS ABS: 0 10*3/uL (ref 0.0–0.5)
EOS PCT: 0 %
HEMATOCRIT: 44.3 % (ref 39.0–52.0)
Hemoglobin: 14.6 g/dL (ref 13.0–17.0)
Lymphocytes Relative: 66 %
Lymphs Abs: 4.7 10*3/uL — ABNORMAL HIGH (ref 0.7–4.0)
MCH: 31.8 pg (ref 26.0–34.0)
MCHC: 33 g/dL (ref 30.0–36.0)
MCV: 96.5 fL (ref 80.0–100.0)
Monocytes Absolute: 0.4 10*3/uL (ref 0.1–1.0)
Monocytes Relative: 5 %
Neutro Abs: 2.1 10*3/uL (ref 1.7–17.7)
Neutrophils Relative %: 29 %
Platelets: 66 10*3/uL — ABNORMAL LOW (ref 150–400)
RBC: 4.59 MIL/uL (ref 4.22–5.81)
RDW: 15.9 % — ABNORMAL HIGH (ref 11.5–15.5)
WBC: 7.1 10*3/uL (ref 4.0–10.5)
nRBC: 0 % (ref 0.0–0.2)

## 2018-02-02 LAB — COMPREHENSIVE METABOLIC PANEL
ALT: 11 U/L (ref 0–44)
AST: 20 U/L (ref 15–41)
Albumin: 3.7 g/dL (ref 3.5–5.0)
Alkaline Phosphatase: 84 U/L (ref 38–126)
Anion gap: 6 (ref 5–15)
BILIRUBIN TOTAL: 0.6 mg/dL (ref 0.3–1.2)
BUN: 9 mg/dL (ref 8–23)
CO2: 27 mmol/L (ref 22–32)
CREATININE: 1.04 mg/dL (ref 0.61–1.24)
Calcium: 8.9 mg/dL (ref 8.9–10.3)
Chloride: 106 mmol/L (ref 98–111)
GFR calc Af Amer: >60 mL/min (ref >60)
GFR calc non Af Amer: >60 mL/min (ref >60)
Glucose, Bld: 95 mg/dL (ref 70–99)
Potassium: 4.4 mmol/L (ref 3.5–5.1)
Sodium: 139 mmol/L (ref 135–145)
Total Protein: 7.4 g/dL (ref 6.5–8.1)

## 2018-02-02 LAB — LACTATE DEHYDROGENASE: LDH: 213 U/L — ABNORMAL HIGH (ref 98–192)

## 2018-02-02 MED ORDER — HEPARIN SOD (PORK) LOCK FLUSH 100 UNIT/ML IV SOLN
INTRAVENOUS | Status: AC
  Administered 2018-02-02: 500 [IU]
  Filled 2018-02-02: qty 5

## 2018-02-02 MED ORDER — ACYCLOVIR 400 MG PO TABS: 400.0000 mg | ORAL_TABLET | Freq: Two times a day (BID) | ORAL | 1 refills | Status: DC

## 2018-02-02 MED ORDER — IOPAMIDOL (ISOVUE-300) INJECTION 61%
50.0000 mL | Freq: Once | INTRAVENOUS | Status: AC | PRN
  Administered 2018-02-02: 10 mL via INTRAVENOUS

## 2018-02-02 MED ORDER — IOPAMIDOL (ISOVUE-300) INJECTION 61%
INTRAVENOUS | Status: AC
  Administered 2018-02-02: 10 mL via INTRAVENOUS
  Filled 2018-02-02: qty 50

## 2018-02-02 NOTE — Assessment & Plan Note (Signed)
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

## 2018-02-02 NOTE — Telephone Encounter (Signed)
Gave avs and calendar ° °

## 2018-02-02 NOTE — Assessment & Plan Note (Signed)
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

## 2018-02-02 NOTE — Progress Notes (Signed)
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   No orders of the defined types were placed in this encounter.   INTERVAL HISTORY: Please see below for problem oriented charting. He returns for further follow-up He denies new lymphadenopathy He has less infections since he started IVIG treatment at home Denies recent infusion reaction He had evaluation of his port this morning which appears to be patent and functional The patient denies any recent signs or symptoms of bleeding such as spontaneous epistaxis, hematuria or hematochezia.  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  No evidence of aortic dissection or other acute process in the chest. 2.  Centrilobular emphysema with a 5 mm right lung nodule. Given the concurrent centrilobular emphysema, follow-up chest CT at 6 -12 months is recommended.  3.  Coronary artery atherosclerosis which is age advanced. 4.   Prominent thoracic lymph nodes.  These can be reevaluated at follow-up.    05/06/2010 Imaging    1.  Multiple small periaortic lymph nodes consistent with the patient's history of the chronic lymphocytic leukemia. 2.  No evidence of solid organ involvement    11/03/2011 Imaging    1.  Interval progression of abdominal and pelvic adenopathy. 2.  Progression of splenomegaly.  The spleen now measures 23 cm in length    11/18/2011 Bone Marrow Biopsy    Bone Marrow, Aspirate,Biopsy, and Clot, right iliac bone - HYPERCELLULAR BONE MARROW WITH EXTENSIVE INVOLVEMENT BY CHRONIC LYMPHOCYTIC LEUKEMIA. PERIPHERAL BLOOD: - CHRONIC LYMPHOCYTIC LEUKEMIA    03/08/2012 Imaging    1.  Progressive increase in retroperitoneal, iliac, and inguinal lymphadenopathy. 2.  Interval increase in massive splenomegaly.     06/01/2012 Procedure    Placement of single lumen port a cath via right internal jugular vein.  The catheter tip lies at the cavoatrial junction.  A power injectable port a cath was placed and is ready for immediate use    06/20/2012 - 11/23/2012 Chemotherapy    He received FCR x 6 cycles    12/19/2012 Imaging    Left common iliac stent. Abdominal vasculature remains patent. Improving supraclavicular and axillary lymphadenopathy. Residual right subpectoral nodes measure up to 10 mm short axis. Improving retroperitoneal lymphadenopathy, measuring up to 16 mm short axis. Improving splenomegaly, measuring 18.7 cm.     01/20/2016 Imaging    1. Stable exam.  No new or progressive findings. 2. No CT findings to explain odynophagia    09/02/2016 Pathology Results    The findings are consistent with involvement by previously known chronic lymphocytic leukemia  09/02/2016 Pathology Results    FISH for CLL came back positive for deletion 13q    09/15/2016 Imaging    1. Borderline enlarged abdominal and pelvic lymph nodes. Compared with 11/03/2011 these are decreased in size as detailed above. 2.  Persistent splenomegaly. 3. Aortic Atherosclerosis (ICD10-I70.0). LAD coronary artery calcification noted.    10/06/2016 - 02/24/2017 Chemotherapy    He received Bendamustine and Rituxan    11/03/2016 Adverse Reaction    Dose of Bendamustine is reduced due to severe pancytopenia    12/28/2016 Imaging    1. Borderline enlarged abdominal peritoneal ligament and abdominal retroperitoneal lymph nodes, stable. 2. Splenomegaly. 3.  Aortic atherosclerosis (ICD10-170.0).    03/22/2017 PET scan    1. No hypermetabolic adenopathy identified within the neck, chest, abdomen or pelvis. 2. Prominent left retroperitoneal node measures 1.6 cm without significant FDG uptake. 3. Splenomegaly. 4. Aortic Atherosclerosis (ICD10-I70.0) and Emphysema (ICD10-J43.9). LAD and left circumflex atherosclerotic calcifications noted.    02/02/2018 Procedure    IMPRESSION: Widely patent right IJ power port catheter.     REVIEW OF SYSTEMS:   Constitutional: Denies fevers, chills or abnormal weight loss Eyes: Denies blurriness of vision Ears, nose, mouth, throat, and face: Denies mucositis or sore throat Respiratory: Denies cough, dyspnea or wheezes Cardiovascular: Denies palpitation, chest discomfort or lower extremity swelling Gastrointestinal:  Denies nausea, heartburn or change in bowel habits Skin: Denies abnormal skin rashes Lymphatics: Denies new lymphadenopathy  Neurological:Denies numbness, tingling or new weaknesses Behavioral/Psych: Mood is stable, no new changes  All other systems were reviewed with the patient and are negative.  I have reviewed the past medical history, past surgical history, social history and family history with the patient and they are unchanged from previous note.  ALLERGIES:  is allergic to codeine.  MEDICATIONS:  Current Outpatient Medications  Medication Sig Dispense Refill  . acetaminophen (TYLENOL) 500 MG tablet Take 500 mg by mouth every 6 (six) hours as needed for mild  pain, moderate pain or headache.    Marland Kitchen acyclovir (ZOVIRAX) 400 MG tablet Take 1 tablet (400 mg total) by mouth 2 (two) times daily. 180 tablet 1  . ALPRAZolam (XANAX) 0.25 MG tablet Take 0.25 mg by mouth as needed for anxiety.    Marland Kitchen b complex vitamins tablet Take 1 tablet by mouth daily.     . benzonatate (TESSALON) 100 MG capsule Take 100 mg by mouth as needed for cough.     . Cholecalciferol (VITAMIN D-1000 MAX ST) 1000 units tablet Take 1,000 Units by mouth daily.     Marland Kitchen esomeprazole (NEXIUM) 40 MG capsule Take 40 mg by mouth 2 (two) times daily.  0  . fluticasone (FLONASE) 50 MCG/ACT nasal spray Place into the nose.    . folic acid (FOLVITE) 830 MCG tablet Take 400 mcg by mouth daily.     . furosemide (LASIX) 20 MG tablet Take 1 tablet (20 mg total) by mouth daily as needed. 30 tablet 3  . HYDROcodone-acetaminophen (NORCO) 10-325 MG tablet take 1 tablet by mouth every 6 to 8 hours if needed for pain  0  . lidocaine (LIDODERM) 5 % apply patch to affected area once daily as directed for lower back pain  0  . Lidocaine 4 % GEL Apply topically. OTC - use as needed for peripheral neuropathy.    . lidocaine-prilocaine (EMLA) cream APPLY TO AFFECTED AREA ONCE 30 g 3  . meclizine (ANTIVERT) 12.5 MG tablet Take 12.5 mg by mouth every 6 (six) hours as  needed for dizziness.   0  . metFORMIN (GLUCOPHAGE-XR) 500 MG 24 hr tablet Take by mouth. Takes one tablet in AM and 2 tablets at night.    . metoprolol succinate (TOPROL-XL) 25 MG 24 hr tablet TAKE 1 TABLET (25 MG TOTAL) BY MOUTH DAILY. 90 tablet 3  . nitroGLYCERIN (NITROSTAT) 0.4 MG SL tablet PLACE 1 TABLET UNDER THE TONGUE EVERY 5 MINUTES X 3 DOSES AS NEEDED FOR CHEST PAIN *MAX 3 DOSES* 25 tablet 3  . nortriptyline (PAMELOR) 25 MG capsule TAKE 4 CAPSULES BY MOUTH AT BEDTIME. 360 capsule 1  . pregabalin (LYRICA) 200 MG capsule TAKE 1 CAPSULE BY MOUTH THREE TIMES A DAY AND 2 CAPSULES AT BEDTIME 450 capsule 1  . PROVENTIL HFA 108 (90 Base) MCG/ACT inhaler  TAKE 2 PUFFS BY MOUTH EVERY 6 HOURS AS NEEDED FOR WHEEZE OR SHORTNESS OF BREATH 20.1 Inhaler 2  . rosuvastatin (CRESTOR) 5 MG tablet TAKE 1 TABLET (5 MG TOTAL) BY MOUTH DAILY. 90 tablet 3  . sildenafil (VIAGRA) 100 MG tablet TAKE 1/2-1 TABLET DAILY AS NEEDED FOR ERECTILE DYSFUNCTION (30 MINUTES PRIOR TO SEXUAL INTERCOURSE) 10 tablet 6  . tetrahydrozoline 0.05 % ophthalmic solution Place 1 drop into both eyes as needed. Red eyes    . zolpidem (AMBIEN) 10 MG tablet Take 10 mg by mouth at bedtime as needed for sleep.   0   No current facility-administered medications for this visit.    Facility-Administered Medications Ordered in Other Visits  Medication Dose Route Frequency Provider Last Rate Last Dose  . 0.9 %  sodium chloride infusion   Intravenous Continuous Alvy Bimler, Savion Washam, MD 50 mL/hr at 03/07/14 1005    . sodium chloride 0.9 % injection 10 mL  10 mL Intracatheter PRN Marcy Panning, MD   10 mL at 08/22/12 1721    PHYSICAL EXAMINATION: ECOG PERFORMANCE STATUS: 1 - Symptomatic but completely ambulatory  Vitals:   02/02/18 1020  BP: (!) 147/67  Pulse: (!) 57  Resp: 18  Temp: 98.1 F (36.7 C)  SpO2: 99%   Filed Weights   02/02/18 1020  Weight: 221 lb (100.2 kg)    GENERAL:alert, no distress and comfortable SKIN: skin color, texture, turgor are normal, no rashes or significant lesions.  Noted skin bruises EYES: normal, Conjunctiva are pink and non-injected, sclera clear OROPHARYNX:no exudate, no erythema and lips, buccal mucosa, and tongue normal  NECK: supple, thyroid normal size, non-tender, without nodularity LYMPH:  no palpable lymphadenopathy in the cervical, axillary or inguinal LUNGS: clear to auscultation and percussion with normal breathing effort HEART: regular rate & rhythm and no murmurs and no lower extremity edema ABDOMEN:abdomen soft, non-tender and normal bowel sounds Musculoskeletal:no cyanosis of digits and no clubbing  NEURO: alert & oriented x 3 with fluent  speech, no focal motor/sensory deficits  LABORATORY DATA:  I have reviewed the data as listed    Component Value Date/Time   NA 139 02/02/2018 0909   NA 136 01/26/2017 0940   K 4.4 02/02/2018 0909   K 4.0 01/26/2017 0940   CL 106 02/02/2018 0909   CL 104 08/03/2012 1229   CO2 27 02/02/2018 0909   CO2 23 01/26/2017 0940   GLUCOSE 95 02/02/2018 0909   GLUCOSE 146 (H) 01/26/2017 0940   GLUCOSE 223 (H) 08/03/2012 1229   BUN 9 02/02/2018 0909   BUN 9.6 01/26/2017 0940   CREATININE 1.04 02/02/2018 0909   CREATININE 1.0 01/26/2017 0940   CALCIUM 8.9 02/02/2018 0909   CALCIUM 8.7  01/26/2017 0940   PROT 7.4 02/02/2018 0909   PROT 5.9 (L) 04/13/2017 0823   PROT 5.7 (L) 01/26/2017 0940   ALBUMIN 3.7 02/02/2018 0909   ALBUMIN 3.6 01/26/2017 0940   AST 20 02/02/2018 0909   AST 13 01/26/2017 0940   ALT 11 02/02/2018 0909   ALT 12 01/26/2017 0940   ALKPHOS 84 02/02/2018 0909   ALKPHOS 78 01/26/2017 0940   BILITOT 0.6 02/02/2018 0909   BILITOT 0.64 01/26/2017 0940   GFRNONAA >60 02/02/2018 0909   GFRAA >60 02/02/2018 0909    No results found for: SPEP, UPEP  Lab Results  Component Value Date   WBC 7.1 02/02/2018   NEUTROABS 2.1 02/02/2018   HGB 14.6 02/02/2018   HCT 44.3 02/02/2018   MCV 96.5 02/02/2018   PLT 66 (L) 02/02/2018      Chemistry      Component Value Date/Time   NA 139 02/02/2018 0909   NA 136 01/26/2017 0940   K 4.4 02/02/2018 0909   K 4.0 01/26/2017 0940   CL 106 02/02/2018 0909   CL 104 08/03/2012 1229   CO2 27 02/02/2018 0909   CO2 23 01/26/2017 0940   BUN 9 02/02/2018 0909   BUN 9.6 01/26/2017 0940   CREATININE 1.04 02/02/2018 0909   CREATININE 1.0 01/26/2017 0940      Component Value Date/Time   CALCIUM 8.9 02/02/2018 0909   CALCIUM 8.7 01/26/2017 0940   ALKPHOS 84 02/02/2018 0909   ALKPHOS 78 01/26/2017 0940   AST 20 02/02/2018 0909   AST 13 01/26/2017 0940   ALT 11 02/02/2018 0909   ALT 12 01/26/2017 0940   BILITOT 0.6 02/02/2018 0909    BILITOT 0.64 01/26/2017 0940       RADIOGRAPHIC STUDIES: I have personally reviewed the radiological images as listed and agreed with the findings in the report. Ir Cv Line Injection  Result Date: 02/02/2018 INDICATION: Port malfunction, difficulty with blood draws EXAM: FLUOROSCOPIC RIGHT IJ POWER PORT CATHETER INJECTION MEDICATIONS: NONE. ANESTHESIA/SEDATION: None. FLUOROSCOPY TIME:  Fluoroscopy Time: 0 minutes 6 seconds (0.8 mGy). COMPLICATIONS: None immediate. PROCEDURE: Informed written consent was obtained from the patient after a thorough discussion of the procedural risks, benefits and alternatives. All questions were addressed. Maximal Sterile Barrier Technique was utilized including caps, mask, sterile gowns, sterile gloves, sterile drape, hand hygiene and skin antiseptic. A timeout was performed prior to the initiation of the procedure. Under sterile conditions, the existing accessed right IJ power port catheter was injected with contrast. Fluoroscopic subtraction imaging performed. Port catheter is widely patent and intact. Tip at the SVC RA junction. Contrast flows freely from the tip without fibrin sheath or thrombus. No contrast extravasation or discontinuity. IMPRESSION: Widely patent right IJ power port catheter. Electronically Signed   By: Jerilynn Mages.  Shick M.D.   On: 02/02/2018 09:24    All questions were answered. The patient knows to call the clinic with any problems, questions or concerns. No barriers to learning was detected.  I spent 15 minutes counseling the patient face to face. The total time spent in the appointment was 20 minutes and more than 50% was on counseling and review of test results  [name], MD 02/02/2018 1:47 PM

## 2018-02-02 NOTE — Progress Notes (Signed)
Patient ID: Ryan Copeland, male   DOB: 05/13/1950, 67 y.o.   MRN: [id number]  Port malfunction  S/p port injection  Widely patent  No comp Stable Ready for use

## 2018-02-02 NOTE — Assessment & Plan Note (Signed)
The patient is asymptomatic. CBC show recurrence of lymphocytosis, likely due to recurrence of CLL I recommend close observation only and see him back in 3 months for repeat history, physical examination and blood work

## 2018-02-03 LAB — IGG, IGA, IGM
IgA: 38 mg/dL — ABNORMAL LOW (ref 61–437)
IgG (Immunoglobin G), Serum: 2068 mg/dL — ABNORMAL HIGH (ref 700–1600)
IgM (Immunoglobulin M), Srm: 32 mg/dL (ref 20–172)

## 2018-02-06 ENCOUNTER — Other Ambulatory Visit: Payer: Self-pay | Admitting: Nurse Practitioner

## 2018-02-06 DIAGNOSIS — E7800 Pure hypercholesterolemia, unspecified: Secondary | ICD-10-CM

## 2018-02-06 DIAGNOSIS — I2510 Atherosclerotic heart disease of native coronary artery without angina pectoris: Secondary | ICD-10-CM

## 2018-02-09 ENCOUNTER — Other Ambulatory Visit: Payer: Self-pay | Admitting: Nurse Practitioner

## 2018-02-28 ENCOUNTER — Other Ambulatory Visit: Payer: Self-pay | Admitting: Hematology and Oncology

## 2018-03-01 ENCOUNTER — Telehealth: Payer: Self-pay

## 2018-03-01 NOTE — Telephone Encounter (Signed)
Called and told him I sent a scheduling message to schedule IVIG at [facility] for now and again in 4 weeks. He verbalized understanding. He said he is waiting to be approved for Medicare.

## 2018-03-02 ENCOUNTER — Telehealth: Payer: Self-pay | Admitting: Hematology and Oncology

## 2018-03-02 NOTE — Telephone Encounter (Signed)
Scheduled appt per 1/16 sch message. Pt is aware of appt date and time

## 2018-03-09 ENCOUNTER — Ambulatory Visit: Payer: Self-pay

## 2018-03-12 ENCOUNTER — Telehealth: Payer: Self-pay | Admitting: Hematology and Oncology

## 2018-03-12 NOTE — Telephone Encounter (Signed)
Scheduled appt per 1/23 sch message. Unable to reach patient - left message with appt date and time

## 2018-03-20 ENCOUNTER — Encounter: Payer: Self-pay | Admitting: Hematology and Oncology

## 2018-03-20 ENCOUNTER — Other Ambulatory Visit: Payer: Self-pay | Admitting: Hematology and Oncology

## 2018-03-20 DIAGNOSIS — D801 Nonfamilial hypogammaglobulinemia: Secondary | ICD-10-CM

## 2018-03-22 ENCOUNTER — Ambulatory Visit: Payer: Self-pay | Admitting: Hematology and Oncology

## 2018-03-22 ENCOUNTER — Other Ambulatory Visit: Payer: Self-pay

## 2018-03-23 ENCOUNTER — Telehealth: Payer: Self-pay | Admitting: Hematology and Oncology

## 2018-03-23 ENCOUNTER — Ambulatory Visit: Payer: Self-pay

## 2018-03-23 NOTE — Telephone Encounter (Signed)
Called to confirm appts on 2/14 - pt is aware.

## 2018-03-30 ENCOUNTER — Inpatient Hospital Stay: Payer: Medicare Other

## 2018-03-30 ENCOUNTER — Telehealth: Payer: Self-pay | Admitting: Hematology and Oncology

## 2018-03-30 ENCOUNTER — Inpatient Hospital Stay (HOSPITAL_BASED_OUTPATIENT_CLINIC_OR_DEPARTMENT_OTHER): Payer: Medicare Other | Admitting: Hematology and Oncology

## 2018-03-30 ENCOUNTER — Encounter: Payer: Self-pay | Admitting: Hematology and Oncology

## 2018-03-30 ENCOUNTER — Inpatient Hospital Stay: Payer: Medicare Other | Attending: Hematology and Oncology

## 2018-03-30 VITALS — BP 139/76 | HR 66 | Temp 98.4°F | Resp 16

## 2018-03-30 VITALS — BP 156/69 | HR 72 | Temp 98.0°F | Resp 17 | Ht 72.0 in | Wt 221.0 lb

## 2018-03-30 DIAGNOSIS — C8308 Small cell B-cell lymphoma, lymph nodes of multiple sites: Secondary | ICD-10-CM | POA: Diagnosis not present

## 2018-03-30 DIAGNOSIS — D61818 Other pancytopenia: Secondary | ICD-10-CM | POA: Diagnosis not present

## 2018-03-30 DIAGNOSIS — Z79899 Other long term (current) drug therapy: Secondary | ICD-10-CM | POA: Insufficient documentation

## 2018-03-30 DIAGNOSIS — Z7984 Long term (current) use of oral hypoglycemic drugs: Secondary | ICD-10-CM | POA: Diagnosis not present

## 2018-03-30 DIAGNOSIS — D801 Nonfamilial hypogammaglobulinemia: Secondary | ICD-10-CM

## 2018-03-30 DIAGNOSIS — J449 Chronic obstructive pulmonary disease, unspecified: Secondary | ICD-10-CM | POA: Diagnosis not present

## 2018-03-30 DIAGNOSIS — Z9221 Personal history of antineoplastic chemotherapy: Secondary | ICD-10-CM | POA: Diagnosis not present

## 2018-03-30 DIAGNOSIS — C9110 Chronic lymphocytic leukemia of B-cell type not having achieved remission: Secondary | ICD-10-CM | POA: Insufficient documentation

## 2018-03-30 DIAGNOSIS — Z95828 Presence of other vascular implants and grafts: Secondary | ICD-10-CM

## 2018-03-30 LAB — CBC WITH DIFFERENTIAL/PLATELET
Abs Immature Granulocytes: 0 10*3/uL (ref 0.00–0.07)
Band Neutrophils: 1 %
Basophils Absolute: 0 10*3/uL (ref 0.0–0.1)
Basophils Relative: 0 %
Eosinophils Absolute: 0 10*3/uL (ref 0.0–0.5)
Eosinophils Relative: 0 %
HCT: 41.5 % (ref 39.0–52.0)
Hemoglobin: 13.6 g/dL (ref 13.0–17.0)
Lymphocytes Relative: 73 %
Lymphs Abs: 9.5 10*3/uL — ABNORMAL HIGH (ref 0.7–4.0)
MCH: 32.9 pg (ref 26.0–34.0)
MCHC: 32.8 g/dL (ref 30.0–36.0)
MCV: 100.2 fL — ABNORMAL HIGH (ref 80.0–100.0)
MONOS PCT: 2 %
Monocytes Absolute: 0.3 10*3/uL (ref 0.1–1.0)
NEUTROS ABS: 3.3 10*3/uL (ref 1.7–17.7)
NEUTROS PCT: 24 %
Platelets: 68 10*3/uL — ABNORMAL LOW (ref 150–400)
RBC: 4.14 MIL/uL — ABNORMAL LOW (ref 4.22–5.81)
RDW: 17.5 % — ABNORMAL HIGH (ref 11.5–15.5)
WBC: 13 10*3/uL — ABNORMAL HIGH (ref 4.0–10.5)
nRBC: 0 % (ref 0.0–0.2)

## 2018-03-30 LAB — COMPREHENSIVE METABOLIC PANEL
ALT: 8 U/L (ref 0–44)
AST: 13 U/L — ABNORMAL LOW (ref 15–41)
Albumin: 3.8 g/dL (ref 3.5–5.0)
Alkaline Phosphatase: 95 U/L (ref 38–126)
Anion gap: 9 (ref 5–15)
BUN: 7 mg/dL — ABNORMAL LOW (ref 8–23)
CHLORIDE: 105 mmol/L (ref 98–111)
CO2: 24 mmol/L (ref 22–32)
Calcium: 8.6 mg/dL — ABNORMAL LOW (ref 8.9–10.3)
Creatinine, Ser: 1.13 mg/dL (ref 0.61–1.24)
GFR calc Af Amer: >60 mL/min (ref >60)
GFR calc non Af Amer: >60 mL/min (ref >60)
Glucose, Bld: 171 mg/dL — ABNORMAL HIGH (ref 70–99)
POTASSIUM: 4.2 mmol/L (ref 3.5–5.1)
Sodium: 138 mmol/L (ref 135–145)
Total Bilirubin: 0.8 mg/dL (ref 0.3–1.2)
Total Protein: 6.2 g/dL — ABNORMAL LOW (ref 6.5–8.1)

## 2018-03-30 MED ORDER — ACETAMINOPHEN 325 MG PO TABS
650.0000 mg | ORAL_TABLET | Freq: Once | ORAL | Status: AC
  Administered 2018-03-30: 650 mg via ORAL

## 2018-03-30 MED ORDER — DIPHENHYDRAMINE HCL 25 MG PO CAPS
ORAL_CAPSULE | ORAL | Status: AC
  Filled 2018-03-30: qty 1

## 2018-03-30 MED ORDER — HEPARIN SOD (PORK) LOCK FLUSH 100 UNIT/ML IV SOLN
500.0000 [IU] | Freq: Once | INTRAVENOUS | Status: AC | PRN
  Administered 2018-03-30: 500 [IU]
  Filled 2018-03-30: qty 5

## 2018-03-30 MED ORDER — DEXTROSE 5 % IV SOLN
Freq: Once | INTRAVENOUS | Status: AC
  Administered 2018-03-30: 09:00:00 via INTRAVENOUS
  Filled 2018-03-30: qty 250

## 2018-03-30 MED ORDER — DIPHENHYDRAMINE HCL 25 MG PO TABS
25.0000 mg | ORAL_TABLET | Freq: Once | ORAL | Status: AC
  Administered 2018-03-30: 25 mg via ORAL
  Filled 2018-03-30: qty 1

## 2018-03-30 MED ORDER — ACETAMINOPHEN 325 MG PO TABS
ORAL_TABLET | ORAL | Status: AC
  Filled 2018-03-30: qty 2

## 2018-03-30 MED ORDER — SODIUM CHLORIDE 0.9% FLUSH
10.0000 mL | Freq: Once | INTRAVENOUS | Status: AC | PRN
  Administered 2018-03-30: 10 mL
  Filled 2018-03-30: qty 10

## 2018-03-30 MED ORDER — IMMUNE GLOBULIN (HUMAN) 20 GM/200ML IV SOLN
0.5000 g/kg | Freq: Once | INTRAVENOUS | Status: AC
  Administered 2018-03-30: 50 g via INTRAVENOUS
  Filled 2018-03-30: qty 100

## 2018-03-30 NOTE — Telephone Encounter (Signed)
Gave avs and calendar ° °

## 2018-03-30 NOTE — Assessment & Plan Note (Signed)
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  Observe only for now.

## 2018-03-30 NOTE — Assessment & Plan Note (Signed)
He has chronic cough Examination does not disclose evidence of wheezes.   As above, I plan to order imaging study to exclude pulmonary process

## 2018-03-30 NOTE — Progress Notes (Signed)
Pt monitored for 30 minutes post IVIG infusion. Pt has had IVIG infusions completed at home, with the most recent being in 01/2018. Pt VSS upon leaving. Pt given AVS with calendar.

## 2018-03-30 NOTE — Assessment & Plan Note (Signed)
His blood work today showed evidence of cancer recurrence The patient has lingering, productive cough for several months I recommend CT imaging with chest, abdomen and pelvis to exclude lymphoma recurrence He agreed to proceed

## 2018-03-30 NOTE — Progress Notes (Signed)
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  Observe only for now.  COPD mixed type [facility]) He has chronic cough Examination does not disclose evidence of wheezes.   As above, I plan to order imaging study to exclude pulmonary process   Orders Placed This Encounter  Procedures  . CT ABDOMEN PELVIS W CONTRAST    Standing Status:   Future    Standing Expiration Date:   03/31/2019    Order Specific Question:   If indicated for the ordered procedure, I authorize the administration of contrast media per Radiology protocol    Answer:   Yes    Order Specific Question:   Preferred imaging location?    Answer:   [facility]    Order Specific Question:   Radiology Contrast Protocol - do NOT remove file path    Answer:   \\[email or web address]  . CT CHEST W CONTRAST    Standing Status:   Future    Standing Expiration Date:   03/31/2019    Order Specific Question:   If indicated for the ordered procedure, I authorize the administration of  contrast media per Radiology protocol    Answer:   Yes    Order Specific Question:   Preferred imaging location?    Answer:   [facility]    Order Specific Question:   Radiology Contrast Protocol - do NOT remove file path    Answer:   \\[email or web address]    INTERVAL HISTORY: Please see below for problem oriented charting. He returns for IVIG infusion Over the last 3 to 4 months, he has chronic productive cough of clear to white sputum He denies fever or chills He continues to bruise easily No new lymphadenopathy His appetite has been stable He returns to the cancer Center today for IVIG treatment due to loss of insurance recently. The patient denies any recent signs or symptoms of bleeding such as spontaneous epistaxis, hematuria or hematochezia.   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  No evidence of aortic dissection or other acute process in the chest. 2.  Centrilobular emphysema with a 5 mm right lung nodule. Given the concurrent centrilobular emphysema, follow-up chest CT at 6 -12 months is recommended.  3.  Coronary artery atherosclerosis which is age advanced. 4.  Prominent thoracic lymph nodes.  These can be reevaluated at follow-up.    05/06/2010 Imaging    1.  Multiple small periaortic lymph nodes consistent with the patient's history of the chronic lymphocytic leukemia. 2.  No evidence of solid organ involvement  11/03/2011 Imaging    1.  Interval progression of abdominal and pelvic adenopathy. 2.  Progression of splenomegaly.  The spleen now measures 23 cm in length    11/18/2011 Bone Marrow Biopsy    Bone Marrow, Aspirate,Biopsy, and Clot, right iliac bone - HYPERCELLULAR BONE MARROW WITH EXTENSIVE INVOLVEMENT BY CHRONIC LYMPHOCYTIC LEUKEMIA. PERIPHERAL BLOOD: - CHRONIC LYMPHOCYTIC LEUKEMIA    03/08/2012 Imaging    1.  Progressive increase in  retroperitoneal, iliac, and inguinal lymphadenopathy. 2.  Interval increase in massive splenomegaly.     06/01/2012 Procedure    Placement of single lumen port a cath via right internal jugular vein.  The catheter tip lies at the cavoatrial junction.  A power injectable port a cath was placed and is ready for immediate use    06/20/2012 - 11/23/2012 Chemotherapy    He received FCR x 6 cycles    12/19/2012 Imaging    Left common iliac stent. Abdominal vasculature remains patent. Improving supraclavicular and axillary lymphadenopathy. Residual right subpectoral nodes measure up to 10 mm short axis. Improving retroperitoneal lymphadenopathy, measuring up to 16 mm short axis. Improving splenomegaly, measuring 18.7 cm.     01/20/2016 Imaging    1. Stable exam.  No new or progressive findings. 2. No CT findings to explain odynophagia    09/02/2016 Pathology Results    The findings are consistent with involvement by previously known chronic lymphocytic leukemia    09/02/2016 Pathology Results    FISH for CLL came back positive for deletion 13q    09/15/2016 Imaging    1. Borderline enlarged abdominal and pelvic lymph nodes. Compared with 11/03/2011 these are decreased in size as detailed above. 2. Persistent splenomegaly. 3. Aortic Atherosclerosis (ICD10-I70.0). LAD coronary artery calcification noted.    10/06/2016 - 02/24/2017 Chemotherapy    He received Bendamustine and Rituxan    11/03/2016 Adverse Reaction    Dose of Bendamustine is reduced due to severe pancytopenia    12/28/2016 Imaging    1. Borderline enlarged abdominal peritoneal ligament and abdominal retroperitoneal lymph nodes, stable. 2. Splenomegaly. 3.  Aortic atherosclerosis (ICD10-170.0).    03/22/2017 PET scan    1. No hypermetabolic adenopathy identified within the neck, chest, abdomen or pelvis. 2. Prominent left retroperitoneal node measures 1.6 cm without significant FDG uptake. 3. Splenomegaly. 4. Aortic  Atherosclerosis (ICD10-I70.0) and Emphysema (ICD10-J43.9). LAD and left circumflex atherosclerotic calcifications noted.    02/02/2018 Procedure    IMPRESSION: Widely patent right IJ power port catheter.     REVIEW OF SYSTEMS:   Constitutional: Denies fevers, chills or abnormal weight loss Eyes: Denies blurriness of vision Ears, nose, mouth, throat, and face: Denies mucositis or sore throat Respiratory: Denies cough, dyspnea or wheezes Cardiovascular: Denies palpitation, chest discomfort or lower extremity swelling Gastrointestinal:  Denies nausea, heartburn or change in bowel habits Skin: Denies abnormal skin rashes Lymphatics: Denies new lymphadenopathy Neurological:Denies numbness, tingling or new weaknesses Behavioral/Psych: Mood is stable, no new changes  All other systems were reviewed with the patient and are negative.  I have reviewed the past medical history, past surgical history, social history and family history with the patient and they are unchanged from previous note.  ALLERGIES:  is allergic to codeine.  MEDICATIONS:  Current Outpatient Medications  Medication Sig Dispense Refill  . acetaminophen (TYLENOL) 500 MG tablet Take 500 mg by mouth every 6 (six) hours as needed for mild pain, moderate pain or headache.    Marland Kitchen acyclovir (ZOVIRAX) 400 MG tablet Take 1 tablet (400  mg total) by mouth 2 (two) times daily. 180 tablet 1  . ALPRAZolam (XANAX) 0.25 MG tablet Take 0.25 mg by mouth as needed for anxiety.    Marland Kitchen b complex vitamins tablet Take 1 tablet by mouth daily.     . benzonatate (TESSALON) 100 MG capsule Take 100 mg by mouth as needed for cough.     . Cholecalciferol (VITAMIN D-1000 MAX ST) 1000 units tablet Take 1,000 Units by mouth daily.     Marland Kitchen esomeprazole (NEXIUM) 40 MG capsule Take 40 mg by mouth 2 (two) times daily.  0  . fluticasone (FLONASE) 50 MCG/ACT nasal spray Place into the nose.    . folic acid (FOLVITE) 031 MCG tablet Take 400 mcg by mouth daily.      . furosemide (LASIX) 20 MG tablet Take 1 tablet (20 mg total) by mouth daily as needed. 30 tablet 3  . HYDROcodone-acetaminophen (NORCO) 10-325 MG tablet take 1 tablet by mouth every 6 to 8 hours if needed for pain  0  . lidocaine (LIDODERM) 5 % apply patch to affected area once daily as directed for lower back pain  0  . lidocaine-prilocaine (EMLA) cream APPLY TO AFFECTED AREA ONCE 30 g 3  . meclizine (ANTIVERT) 12.5 MG tablet Take 12.5 mg by mouth every 6 (six) hours as needed for dizziness.   0  . metFORMIN (GLUCOPHAGE-XR) 500 MG 24 hr tablet Take by mouth. Takes one tablet in AM and 2 tablets at night.    . metoprolol succinate (TOPROL-XL) 25 MG 24 hr tablet TAKE 1 TABLET (25 MG TOTAL) BY MOUTH DAILY. 90 tablet 2  . nitroGLYCERIN (NITROSTAT) 0.4 MG SL tablet PLACE 1 TABLET UNDER THE TONGUE EVERY 5 MINUTES X 3 DOSES AS NEEDED FOR CHEST PAIN *MAX 3 DOSES* 25 tablet 3  . nortriptyline (PAMELOR) 25 MG capsule TAKE 4 CAPSULES BY MOUTH AT BEDTIME. 360 capsule 1  . pregabalin (LYRICA) 200 MG capsule TAKE 1 CAPSULE BY MOUTH THREE TIMES A DAY AND 2 CAPSULES AT BEDTIME 450 capsule 1  . PROVENTIL HFA 108 (90 Base) MCG/ACT inhaler TAKE 2 PUFFS BY MOUTH EVERY 6 HOURS AS NEEDED FOR WHEEZE OR SHORTNESS OF BREATH 20.1 Inhaler 2  . rosuvastatin (CRESTOR) 5 MG tablet TAKE 1 TABLET (5 MG TOTAL) BY MOUTH DAILY. 90 tablet 2  . sildenafil (VIAGRA) 100 MG tablet TAKE 1/2-1 TABLET DAILY AS NEEDED FOR ERECTILE DYSFUNCTION (30 MINUTES PRIOR TO SEXUAL INTERCOURSE) 10 tablet 6  . tetrahydrozoline 0.05 % ophthalmic solution Place 1 drop into both eyes as needed. Red eyes    . zolpidem (AMBIEN) 10 MG tablet Take 10 mg by mouth at bedtime as needed for sleep.   0   No current facility-administered medications for this visit.    Facility-Administered Medications Ordered in Other Visits  Medication Dose Route Frequency Provider Last Rate Last Dose  . 0.9 %  sodium chloride infusion   Intravenous Continuous Alvy Bimler, Avrey Hyser, MD  50 mL/hr at 03/07/14 1005    . sodium chloride 0.9 % injection 10 mL  10 mL Intracatheter PRN Marcy Panning, MD   10 mL at 08/22/12 1721    PHYSICAL EXAMINATION: ECOG PERFORMANCE STATUS: 1 - Symptomatic but completely ambulatory  Vitals:   03/30/18 0839  BP: (!) 156/69  Pulse: 72  Resp: 17  Temp: 98 F (36.7 C)  SpO2: 98%   Filed Weights   03/30/18 0839  Weight: 221 lb (100.2 kg)    GENERAL:alert, no distress and comfortable SKIN:  skin color, texture, turgor are normal, no rashes or significant lesions.  Noted skin bruising EYES: normal, Conjunctiva are pink and non-injected, sclera clear OROPHARYNX:no exudate, no erythema and lips, buccal mucosa, and tongue normal  NECK: supple, thyroid normal size, non-tender, without nodularity LYMPH:  no palpable lymphadenopathy in the cervical, axillary or inguinal LUNGS: clear to auscultation and percussion with normal breathing effort HEART: regular rate & rhythm and no murmurs and no lower extremity edema ABDOMEN:abdomen soft, non-tender and normal bowel sounds Musculoskeletal:no cyanosis of digits and no clubbing  NEURO: alert & oriented x 3 with fluent speech, no focal motor/sensory deficits  LABORATORY DATA:  I have reviewed the data as listed    Component Value Date/Time   NA 138 03/30/2018 0829   NA 136 01/26/2017 0940   K 4.2 03/30/2018 0829   K 4.0 01/26/2017 0940   CL 105 03/30/2018 0829   CL 104 08/03/2012 1229   CO2 24 03/30/2018 0829   CO2 23 01/26/2017 0940   GLUCOSE 171 (H) 03/30/2018 0829   GLUCOSE 146 (H) 01/26/2017 0940   GLUCOSE 223 (H) 08/03/2012 1229   BUN 7 (L) 03/30/2018 0829   BUN 9.6 01/26/2017 0940   CREATININE 1.13 03/30/2018 0829   CREATININE 1.0 01/26/2017 0940   CALCIUM 8.6 (L) 03/30/2018 0829   CALCIUM 8.7 01/26/2017 0940   PROT 6.2 (L) 03/30/2018 0829   PROT 5.9 (L) 04/13/2017 0823   PROT 5.7 (L) 01/26/2017 0940   ALBUMIN 3.8 03/30/2018 0829   ALBUMIN 3.6 01/26/2017 0940   AST 13 (L)  03/30/2018 0829   AST 13 01/26/2017 0940   ALT 8 03/30/2018 0829   ALT 12 01/26/2017 0940   ALKPHOS 95 03/30/2018 0829   ALKPHOS 78 01/26/2017 0940   BILITOT 0.8 03/30/2018 0829   BILITOT 0.64 01/26/2017 0940   GFRNONAA >60 03/30/2018 0829   GFRAA >60 03/30/2018 0829    No results found for: SPEP, UPEP  Lab Results  Component Value Date   WBC 13.0 (H) 03/30/2018   NEUTROABS PENDING 03/30/2018   HGB 13.6 03/30/2018   HCT 41.5 03/30/2018   MCV 100.2 (H) 03/30/2018   PLT 68 (L) 03/30/2018      Chemistry      Component Value Date/Time   NA 138 03/30/2018 0829   NA 136 01/26/2017 0940   K 4.2 03/30/2018 0829   K 4.0 01/26/2017 0940   CL 105 03/30/2018 0829   CL 104 08/03/2012 1229   CO2 24 03/30/2018 0829   CO2 23 01/26/2017 0940   BUN 7 (L) 03/30/2018 0829   BUN 9.6 01/26/2017 0940   CREATININE 1.13 03/30/2018 0829   CREATININE 1.0 01/26/2017 0940      Component Value Date/Time   CALCIUM 8.6 (L) 03/30/2018 0829   CALCIUM 8.7 01/26/2017 0940   ALKPHOS 95 03/30/2018 0829   ALKPHOS 78 01/26/2017 0940   AST 13 (L) 03/30/2018 0829   AST 13 01/26/2017 0940   ALT 8 03/30/2018 0829   ALT 12 01/26/2017 0940   BILITOT 0.8 03/30/2018 0829   BILITOT 0.64 01/26/2017 0940      All questions were answered. The patient knows to call the clinic with any problems, questions or concerns. No barriers to learning was detected.  I spent 25 minutes counseling the patient face to face. The total time spent in the appointment was 30 minutes and more than 50% was on counseling and review of test results  [name], MD 03/30/2018 9:11 AM

## 2018-03-30 NOTE — Patient Instructions (Signed)

## 2018-03-30 NOTE — Assessment & Plan Note (Signed)
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

## 2018-03-31 LAB — IGG, IGA, IGM
IgA: 36 mg/dL — ABNORMAL LOW (ref 61–437)
IgG (Immunoglobin G), Serum: 795 mg/dL (ref 700–1600)
IgM (Immunoglobulin M), Srm: 25 mg/dL (ref 20–172)

## 2018-04-04 ENCOUNTER — Ambulatory Visit (HOSPITAL_COMMUNITY): Payer: Medicare Other

## 2018-04-06 ENCOUNTER — Ambulatory Visit (HOSPITAL_COMMUNITY)
Admission: RE | Admit: 2018-04-06 | Discharge: 2018-04-06 | Disposition: A | Discharge status: Home / Self Care | Payer: Medicare Other | Source: Ambulatory Visit | Attending: Hematology and Oncology | Admitting: Hematology and Oncology

## 2018-04-06 ENCOUNTER — Encounter (HOSPITAL_COMMUNITY): Payer: Self-pay

## 2018-04-06 DIAGNOSIS — C8308 Small cell B-cell lymphoma, lymph nodes of multiple sites: Secondary | ICD-10-CM | POA: Insufficient documentation

## 2018-04-06 MED ORDER — SODIUM CHLORIDE (PF) 0.9 % IJ SOLN
INTRAMUSCULAR | Status: AC
  Filled 2018-04-06: qty 50

## 2018-04-06 MED ORDER — IOHEXOL 300 MG/ML  SOLN
100.0000 mL | Freq: Once | INTRAMUSCULAR | Status: AC | PRN
  Administered 2018-04-06: 100 mL via INTRAVENOUS

## 2018-04-09 ENCOUNTER — Other Ambulatory Visit: Payer: Self-pay

## 2018-04-09 ENCOUNTER — Inpatient Hospital Stay (HOSPITAL_BASED_OUTPATIENT_CLINIC_OR_DEPARTMENT_OTHER): Payer: Medicare Other | Admitting: Hematology and Oncology

## 2018-04-09 DIAGNOSIS — D801 Nonfamilial hypogammaglobulinemia: Secondary | ICD-10-CM

## 2018-04-09 DIAGNOSIS — Z9221 Personal history of antineoplastic chemotherapy: Secondary | ICD-10-CM

## 2018-04-09 DIAGNOSIS — C9110 Chronic lymphocytic leukemia of B-cell type not having achieved remission: Secondary | ICD-10-CM | POA: Diagnosis not present

## 2018-04-09 DIAGNOSIS — Z79899 Other long term (current) drug therapy: Secondary | ICD-10-CM

## 2018-04-09 DIAGNOSIS — D61818 Other pancytopenia: Secondary | ICD-10-CM

## 2018-04-09 DIAGNOSIS — C8308 Small cell B-cell lymphoma, lymph nodes of multiple sites: Secondary | ICD-10-CM

## 2018-04-09 DIAGNOSIS — D696 Thrombocytopenia, unspecified: Secondary | ICD-10-CM

## 2018-04-09 DIAGNOSIS — Z7984 Long term (current) use of oral hypoglycemic drugs: Secondary | ICD-10-CM

## 2018-04-09 DIAGNOSIS — J449 Chronic obstructive pulmonary disease, unspecified: Secondary | ICD-10-CM | POA: Diagnosis not present

## 2018-04-10 ENCOUNTER — Telehealth: Payer: Self-pay

## 2018-04-10 ENCOUNTER — Encounter: Payer: Self-pay | Admitting: Hematology and Oncology

## 2018-04-10 ENCOUNTER — Telehealth: Payer: Self-pay | Admitting: Pharmacist

## 2018-04-10 DIAGNOSIS — C9110 Chronic lymphocytic leukemia of B-cell type not having achieved remission: Secondary | ICD-10-CM

## 2018-04-10 MED ORDER — ACALABRUTINIB 100 MG PO CAPS: 100.0000 mg | ORAL_CAPSULE | Freq: Two times a day (BID) | ORAL | 11 refills | Status: DC

## 2018-04-10 MED ORDER — LOPERAMIDE HCL 2 MG PO CAPS: 4.0000 mg | ORAL_CAPSULE | ORAL | 2 refills | Status: DC | PRN

## 2018-04-10 MED ORDER — FAMOTIDINE 20 MG PO TABS: 20.0000 mg | ORAL_TABLET | Freq: Two times a day (BID) | ORAL | 11 refills | Status: DC

## 2018-04-10 MED ORDER — ALLOPURINOL 300 MG PO TABS: 300.0000 mg | ORAL_TABLET | Freq: Every day | ORAL | 5 refills | Status: DC

## 2018-04-10 NOTE — Progress Notes (Signed)
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 [facility]) I have reviewed multiple imaging studies with the patient The patient have signs of disease relapse He has rapid lymphocyte doubling time with associated thrombocytopenia He also have recurrent infection likely due to immunocompromise state We discussed the current guidelines and goals of care Ultimately, he agreed to proceed with the plan of care Based on the current guidelines, I recommend treatment with Acalabrutinib, based on the publication below  Acalabrutinib (ACP-196) in Relapsed Chronic Lymphocytic Leukemia by Patton Salles al  This article was published on January 20, 2014, at [email or web address] N Engl J Med [phone]. DOI: 10.1056/NEJMoa1509981   BACKGROUND  Irreversible inhibition of Bruton's tyrosine kinase (BTK) by ibrutinib represents an important therapeutic advance for the treatment of chronic lymphocytic leukemia (CLL). However, ibrutinib also irreversibly inhibits alternative kinase targets, which potentially compromises its therapeutic index. Acalabrutinib (ACP-196) is a more selective, irreversible BTK inhibitor that is specifically designed to improve on the safety and efficacy of first-generation BTK inhibitors.  METHODS  In this uncontrolled, phase 1-2, multicenter study, we administered oral acalabrutinib to 61 patients who had relapsed CLL to assess the safety, efficacy, pharmacokinetics, and pharmacodynamics of acalabrutinib. Patients were treated with acalabrutinib at a dose of 100 to 400 mg once daily in the dose-escalation (phase 1) portion of the study and 100 mg twice daily in the expansion (phase 2) portion.  RESULTS  The median age of the  patients was 73 years, and patients had received a median of three previous therapies for CLL; 31% had chromosome 17p13.1 deletion, and 75% had unmutated immunoglobulin heavy-chain variable genes. No dose-limiting toxic effects occurred during the dose-escalation portion of the study. The most common adverse events observed were headache (in 43% of the patients), diarrhea (in 39%), and increased weight (in 26%). Most adverse events were of grade 1 or 2. At a median followup of 14.3 months, the overall response rate was 95%, including 85% with a partial response and 10% with a partial response with lymphocytosis; the remaining 5% of patients had stable disease. Among patients with chromosome 17p13.1 deletion, the overall response rate was 100%. No cases of Richter's transformation (CLL that has evolved into large-cell lymphoma) and only one case of CLL progression have occurred.  CONCLUSIONS  In this study, the selective BTK inhibitor acalabrutinib had promising safety and efficacy profiles in patients with relapsed CLL, including those with chromosome 17p13.1 deletion. (Funded by the [employer] and others; Midwife.gov number, WLN98921194.)  We discussed some of the risks, benefits and side-effects of Acalabrutinib Treatment intent is palliative  Some of the short term side-effects included, though not limited to, risk of fatigue, weight loss, tumor lysis syndrome, risk of allergic reactions, pancytopenia, life-threatening infections, need for transfusions of blood products, nausea, vomiting, change in bowel habits, admission to hospital for various reasons, and risks of death.   Long term side-effects are also discussed including permanent damage to nerve function, chronic fatigue, and rare secondary malignancy including bone marrow disorders.   The patient is aware that the response rates discussed earlier is not guaranteed.    After a long discussion, patient made an informed decision to  proceed with the prescribed plan of care.   Patient education material was dispensed  I will get assistance from pharmacist for dosing and medication interaction The patient is currently taking proton pump inhibitor which may interact with the medications He will continue acyclovir We will start him on allopurinol for tumor lysis prophylaxis when he is ready to start treatment He is aware that he will be coming in weekly in the first month for blood count monitoring and I will see him within 2 weeks for toxicity review I will get baseline EKG before he leaves today  Thrombocytopenia He has persistent pancytopenia due CLL, liver disease and chronic splenomegaly He is not symptomatic except for minor bruising.  Observe only for now.  Hypogammaglobulinemia, acquired He has received IVIG recently. Due to plan to start him on chemotherapy, I do not plan to repeat IVIG treatment for now   Orders Placed This Encounter  Procedures  . Lactate dehydrogenase    Standing Status:   Standing    Number of Occurrences:   9    Standing Expiration Date:   04/11/2019  . Uric acid    Standing Status:   Standing    Number of Occurrences:   9    Standing Expiration Date:   04/11/2019    INTERVAL HISTORY: Please see below for problem oriented charting. He returns to review test results Since last time I saw him, he had some minor pain at the port infusion site after IVIG Denies new recent infection, fever or chills He continues to have chronic congestion and significant skin bruises The patient denies any recent signs or symptoms of bleeding such as spontaneous epistaxis, hematuria or hematochezia.   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  No evidence of aortic dissection or other acute process in the chest. 2.   Centrilobular emphysema with a 5 mm right lung nodule. Given the concurrent centrilobular emphysema, follow-up chest CT at 6 -12 months is recommended.  3.  Coronary artery atherosclerosis which is age advanced. 4.  Prominent thoracic lymph nodes.  These can be reevaluated at follow-up.    05/06/2010 Imaging    1.  Multiple small periaortic lymph nodes consistent with the patient's history of the chronic lymphocytic leukemia. 2.  No evidence of solid organ involvement    11/03/2011 Imaging    1.  Interval progression of abdominal and pelvic adenopathy. 2.  Progression of splenomegaly.  The spleen now measures 23 cm in length    11/18/2011 Bone Marrow Biopsy    Bone Marrow, Aspirate,Biopsy, and Clot, right iliac bone - HYPERCELLULAR BONE MARROW WITH EXTENSIVE INVOLVEMENT BY CHRONIC LYMPHOCYTIC LEUKEMIA. PERIPHERAL BLOOD: - CHRONIC LYMPHOCYTIC LEUKEMIA    03/08/2012 Imaging    1.  Progressive increase in retroperitoneal, iliac, and inguinal lymphadenopathy. 2.  Interval increase in massive splenomegaly.     06/01/2012 Procedure    Placement of single lumen port a cath via right internal jugular vein.  The catheter tip lies at the cavoatrial junction.  A power injectable port a cath was placed and is ready for immediate use    06/20/2012 - 11/23/2012 Chemotherapy    He received FCR x 6 cycles    12/19/2012 Imaging    Left common iliac stent. Abdominal vasculature remains patent. Improving supraclavicular and axillary lymphadenopathy. Residual right subpectoral nodes measure up to 10 mm short axis. Improving retroperitoneal lymphadenopathy,  measuring up to 16 mm short axis. Improving splenomegaly, measuring 18.7 cm.     01/20/2016 Imaging    1. Stable exam.  No new or progressive findings. 2. No CT findings to explain odynophagia    09/02/2016 Pathology Results    The findings are consistent with involvement by previously known chronic lymphocytic leukemia    09/02/2016 Pathology Results     FISH for CLL came back positive for deletion 13q    09/15/2016 Imaging    1. Borderline enlarged abdominal and pelvic lymph nodes. Compared with 11/03/2011 these are decreased in size as detailed above. 2. Persistent splenomegaly. 3. Aortic Atherosclerosis (ICD10-I70.0). LAD coronary artery calcification noted.    10/06/2016 - 02/24/2017 Chemotherapy    He received Bendamustine and Rituxan    11/03/2016 Adverse Reaction    Dose of Bendamustine is reduced due to severe pancytopenia    12/28/2016 Imaging    1. Borderline enlarged abdominal peritoneal ligament and abdominal retroperitoneal lymph nodes, stable. 2. Splenomegaly. 3.  Aortic atherosclerosis (ICD10-170.0).    03/22/2017 PET scan    1. No hypermetabolic adenopathy identified within the neck, chest, abdomen or pelvis. 2. Prominent left retroperitoneal node measures 1.6 cm without significant FDG uptake. 3. Splenomegaly. 4. Aortic Atherosclerosis (ICD10-I70.0) and Emphysema (ICD10-J43.9). LAD and left circumflex atherosclerotic calcifications noted.    02/02/2018 Procedure    IMPRESSION: Widely patent right IJ power port catheter.    04/06/2018 Imaging    1. Interval enlargement of axillary, mediastinal, and retroperitoneal lymph nodes, as well as increased splenomegaly, findings concerning for progression of CLL in comparison to prior PET-CT dated 03/22/2017.  2.  Other chronic and incidental findings as detailed above.      REVIEW OF SYSTEMS:   Constitutional: Denies fevers, chills or abnormal weight loss Eyes: Denies blurriness of vision Ears, nose, mouth, throat, and face: Denies mucositis or sore throat Respiratory: Denies cough, dyspnea or wheezes Cardiovascular: Denies palpitation, chest discomfort or lower extremity swelling Gastrointestinal:  Denies nausea, heartburn or change in bowel habits Skin: Denies abnormal skin rashes Lymphatics: Denies new lymphadenopathy Neurological:Denies numbness, tingling or new  weaknesses Behavioral/Psych: Mood is stable, no new changes  All other systems were reviewed with the patient and are negative.  I have reviewed the past medical history, past surgical history, social history and family history with the patient and they are unchanged from previous note.  ALLERGIES:  is allergic to codeine.  MEDICATIONS:  Current Outpatient Medications  Medication Sig Dispense Refill  . acetaminophen (TYLENOL) 500 MG tablet Take 500 mg by mouth every 6 (six) hours as needed for mild pain, moderate pain or headache.    Marland Kitchen acyclovir (ZOVIRAX) 400 MG tablet Take 1 tablet (400 mg total) by mouth 2 (two) times daily. 180 tablet 1  . ALPRAZolam (XANAX) 0.25 MG tablet Take 0.25 mg by mouth as needed for anxiety.    Marland Kitchen b complex vitamins tablet Take 1 tablet by mouth daily.     . benzonatate (TESSALON) 100 MG capsule Take 100 mg by mouth as needed for cough.     . Cholecalciferol (VITAMIN D-1000 MAX ST) 1000 units tablet Take 1,000 Units by mouth daily.     Marland Kitchen esomeprazole (NEXIUM) 40 MG capsule Take 40 mg by mouth 2 (two) times daily.  0  . fluticasone (FLONASE) 50 MCG/ACT nasal spray Place into the nose.    . folic acid (FOLVITE) 620 MCG tablet Take 400 mcg by mouth daily.     . furosemide (LASIX) 20  MG tablet Take 1 tablet (20 mg total) by mouth daily as needed. 30 tablet 3  . HYDROcodone-acetaminophen (NORCO) 10-325 MG tablet take 1 tablet by mouth every 6 to 8 hours if needed for pain  0  . lidocaine (LIDODERM) 5 % apply patch to affected area once daily as directed for lower back pain  0  . lidocaine-prilocaine (EMLA) cream APPLY TO AFFECTED AREA ONCE 30 g 3  . meclizine (ANTIVERT) 12.5 MG tablet Take 12.5 mg by mouth every 6 (six) hours as needed for dizziness.   0  . metFORMIN (GLUCOPHAGE-XR) 500 MG 24 hr tablet Take by mouth. Takes one tablet in AM and 2 tablets at night.    . metoprolol succinate (TOPROL-XL) 25 MG 24 hr tablet TAKE 1 TABLET (25 MG TOTAL) BY MOUTH DAILY. 90  tablet 2  . nitroGLYCERIN (NITROSTAT) 0.4 MG SL tablet PLACE 1 TABLET UNDER THE TONGUE EVERY 5 MINUTES X 3 DOSES AS NEEDED FOR CHEST PAIN *MAX 3 DOSES* 25 tablet 3  . nortriptyline (PAMELOR) 25 MG capsule TAKE 4 CAPSULES BY MOUTH AT BEDTIME. 360 capsule 1  . pregabalin (LYRICA) 200 MG capsule TAKE 1 CAPSULE BY MOUTH THREE TIMES A DAY AND 2 CAPSULES AT BEDTIME 450 capsule 1  . PROVENTIL HFA 108 (90 Base) MCG/ACT inhaler TAKE 2 PUFFS BY MOUTH EVERY 6 HOURS AS NEEDED FOR WHEEZE OR SHORTNESS OF BREATH 20.1 Inhaler 2  . rosuvastatin (CRESTOR) 5 MG tablet TAKE 1 TABLET (5 MG TOTAL) BY MOUTH DAILY. 90 tablet 2  . sildenafil (VIAGRA) 100 MG tablet TAKE 1/2-1 TABLET DAILY AS NEEDED FOR ERECTILE DYSFUNCTION (30 MINUTES PRIOR TO SEXUAL INTERCOURSE) 10 tablet 6  . tetrahydrozoline 0.05 % ophthalmic solution Place 1 drop into both eyes as needed. Red eyes    . zolpidem (AMBIEN) 10 MG tablet Take 10 mg by mouth at bedtime as needed for sleep.   0   No current facility-administered medications for this visit.    Facility-Administered Medications Ordered in Other Visits  Medication Dose Route Frequency Provider Last Rate Last Dose  . 0.9 %  sodium chloride infusion   Intravenous Continuous Alvy Bimler, Jennifer Payes, MD 50 mL/hr at 03/07/14 1005    . sodium chloride 0.9 % injection 10 mL  10 mL Intracatheter PRN Marcy Panning, MD   10 mL at 08/22/12 1721    PHYSICAL EXAMINATION: ECOG PERFORMANCE STATUS: 1 - Symptomatic but completely ambulatory  Vitals:   04/09/18 1606  BP: (!) 155/69  Pulse: 64  Resp: 18  Temp: 98 F (36.7 C)  SpO2: 100%   Filed Weights   04/09/18 1606  Weight: 219 lb 6.4 oz (99.5 kg)    GENERAL:alert, no distress and comfortable SKIN: Significant skin bruises are noted Musculoskeletal:no cyanosis of digits and no clubbing  NEURO: alert & oriented x 3 with fluent speech, no focal motor/sensory deficits  LABORATORY DATA:  I have reviewed the data as listed    Component Value Date/Time    NA 138 03/30/2018 0829   NA 136 01/26/2017 0940   K 4.2 03/30/2018 0829   K 4.0 01/26/2017 0940   CL 105 03/30/2018 0829   CL 104 08/03/2012 1229   CO2 24 03/30/2018 0829   CO2 23 01/26/2017 0940   GLUCOSE 171 (H) 03/30/2018 0829   GLUCOSE 146 (H) 01/26/2017 0940   GLUCOSE 223 (H) 08/03/2012 1229   BUN 7 (L) 03/30/2018 0829   BUN 9.6 01/26/2017 0940   CREATININE 1.13 03/30/2018 0829   CREATININE  1.0 01/26/2017 0940   CALCIUM 8.6 (L) 03/30/2018 0829   CALCIUM 8.7 01/26/2017 0940   PROT 6.2 (L) 03/30/2018 0829   PROT 5.9 (L) 04/13/2017 0823   PROT 5.7 (L) 01/26/2017 0940   ALBUMIN 3.8 03/30/2018 0829   ALBUMIN 3.6 01/26/2017 0940   AST 13 (L) 03/30/2018 0829   AST 13 01/26/2017 0940   ALT 8 03/30/2018 0829   ALT 12 01/26/2017 0940   ALKPHOS 95 03/30/2018 0829   ALKPHOS 78 01/26/2017 0940   BILITOT 0.8 03/30/2018 0829   BILITOT 0.64 01/26/2017 0940   GFRNONAA >60 03/30/2018 0829   GFRAA >60 03/30/2018 0829    No results found for: SPEP, UPEP  Lab Results  Component Value Date   WBC 13.0 (H) 03/30/2018   NEUTROABS 3.3 03/30/2018   HGB 13.6 03/30/2018   HCT 41.5 03/30/2018   MCV 100.2 (H) 03/30/2018   PLT 68 (L) 03/30/2018      Chemistry      Component Value Date/Time   NA 138 03/30/2018 0829   NA 136 01/26/2017 0940   K 4.2 03/30/2018 0829   K 4.0 01/26/2017 0940   CL 105 03/30/2018 0829   CL 104 08/03/2012 1229   CO2 24 03/30/2018 0829   CO2 23 01/26/2017 0940   BUN 7 (L) 03/30/2018 0829   BUN 9.6 01/26/2017 0940   CREATININE 1.13 03/30/2018 0829   CREATININE 1.0 01/26/2017 0940      Component Value Date/Time   CALCIUM 8.6 (L) 03/30/2018 0829   CALCIUM 8.7 01/26/2017 0940   ALKPHOS 95 03/30/2018 0829   ALKPHOS 78 01/26/2017 0940   AST 13 (L) 03/30/2018 0829   AST 13 01/26/2017 0940   ALT 8 03/30/2018 0829   ALT 12 01/26/2017 0940   BILITOT 0.8 03/30/2018 0829   BILITOT 0.64 01/26/2017 0940       RADIOGRAPHIC STUDIES: I have reviewed multiple  imaging studies with the patient I have personally reviewed the radiological images as listed and agreed with the findings in the report. Ct Chest W Contrast  Result Date: 04/06/2018 CLINICAL DATA:  Follow-up CLL EXAM: CT CHEST, ABDOMEN, AND PELVIS WITH CONTRAST TECHNIQUE: Multidetector CT imaging of the chest, abdomen and pelvis was performed following the standard protocol during bolus administration of intravenous contrast. CONTRAST:  16m OMNIPAQUE IOHEXOL 300 MG/ML  SOLN COMPARISON:  PET-CT, 03/22/2017, CT chest abdomen pelvis, 09/15/2016 FINDINGS: CT CHEST FINDINGS Cardiovascular: Coronary artery calcifications and LAD stent. Normal heart size. No pericardial effusion. Right internal jugular port catheter. Mediastinum/Nodes: There are numerous prominent bilateral axillary and mediastinal the the lymph nodes, which are increased in size and conspicuity compared to prior PET-CT dated 03/22/2017 (series 2, image 15, compared with prior exam series 4, image 58). The largest nodes measure approximately 1.2 cm. The thyroid gland, trachea, and esophagus demonstrate no significant findings. Lungs/Pleura: Minimal emphysema. Bibasilar scarring or atelectasis. No pleural effusion or pneumothorax. Musculoskeletal: No chest wall mass or suspicious bone lesions identified. CT ABDOMEN PELVIS FINDINGS Hepatobiliary: No focal liver abnormality is seen. Status post cholecystectomy. No biliary dilatation. Pancreas: Unremarkable. No pancreatic ductal dilatation or surrounding inflammatory changes. Spleen: Gross splenomegaly, maximum coronal span approximately 23.7 cm, previously 17.4 cm. Adrenals/Urinary Tract: Adrenal glands are unremarkable. Kidneys are normal, without renal calculi, focal lesion, or hydronephrosis. Bladder is unremarkable. Stomach/Bowel: Stomach is within normal limits. Appendix appears normal. No evidence of bowel wall thickening, distention, or inflammatory changes. Vascular/Lymphatic: Severe calcific  atherosclerosis. Left common iliac artery stent. Numerous enlarged  retroperitoneal lymph nodes, increased in size compared to prior examination (series 2, image 76). The largest left para-aortic lymph node conglomerate measures approximately 3.4 cm, previously 1.9 cm. Reproductive: No mass or other abnormality. Other: No abdominal wall hernia or abnormality. No abdominopelvic ascites. Musculoskeletal: No acute or significant osseous findings. IMPRESSION: 1. Interval enlargement of axillary, mediastinal, and retroperitoneal lymph nodes, as well as increased splenomegaly, findings concerning for progression of CLL in comparison to prior PET-CT dated 03/22/2017. 2.  Other chronic and incidental findings as detailed above. Electronically Signed   By: Eddie Candle M.D.   On: 04/06/2018 09:13   Ct Abdomen Pelvis W Contrast  Result Date: 04/06/2018 CLINICAL DATA:  Follow-up CLL EXAM: CT CHEST, ABDOMEN, AND PELVIS WITH CONTRAST TECHNIQUE: Multidetector CT imaging of the chest, abdomen and pelvis was performed following the standard protocol during bolus administration of intravenous contrast. CONTRAST:  142m OMNIPAQUE IOHEXOL 300 MG/ML  SOLN COMPARISON:  PET-CT, 03/22/2017, CT chest abdomen pelvis, 09/15/2016 FINDINGS: CT CHEST FINDINGS Cardiovascular: Coronary artery calcifications and LAD stent. Normal heart size. No pericardial effusion. Right internal jugular port catheter. Mediastinum/Nodes: There are numerous prominent bilateral axillary and mediastinal the the lymph nodes, which are increased in size and conspicuity compared to prior PET-CT dated 03/22/2017 (series 2, image 15, compared with prior exam series 4, image 58). The largest nodes measure approximately 1.2 cm. The thyroid gland, trachea, and esophagus demonstrate no significant findings. Lungs/Pleura: Minimal emphysema. Bibasilar scarring or atelectasis. No pleural effusion or pneumothorax. Musculoskeletal: No chest wall mass or suspicious bone lesions  identified. CT ABDOMEN PELVIS FINDINGS Hepatobiliary: No focal liver abnormality is seen. Status post cholecystectomy. No biliary dilatation. Pancreas: Unremarkable. No pancreatic ductal dilatation or surrounding inflammatory changes. Spleen: Gross splenomegaly, maximum coronal span approximately 23.7 cm, previously 17.4 cm. Adrenals/Urinary Tract: Adrenal glands are unremarkable. Kidneys are normal, without renal calculi, focal lesion, or hydronephrosis. Bladder is unremarkable. Stomach/Bowel: Stomach is within normal limits. Appendix appears normal. No evidence of bowel wall thickening, distention, or inflammatory changes. Vascular/Lymphatic: Severe calcific atherosclerosis. Left common iliac artery stent. Numerous enlarged retroperitoneal lymph nodes, increased in size compared to prior examination (series 2, image 76). The largest left para-aortic lymph node conglomerate measures approximately 3.4 cm, previously 1.9 cm. Reproductive: No mass or other abnormality. Other: No abdominal wall hernia or abnormality. No abdominopelvic ascites. Musculoskeletal: No acute or significant osseous findings. IMPRESSION: 1. Interval enlargement of axillary, mediastinal, and retroperitoneal lymph nodes, as well as increased splenomegaly, findings concerning for progression of CLL in comparison to prior PET-CT dated 03/22/2017. 2.  Other chronic and incidental findings as detailed above. Electronically Signed   By: AEddie CandleM.D.   On: 04/06/2018 09:13    All questions were answered. The patient knows to call the clinic with any problems, questions or concerns. No barriers to learning was detected.  I spent 40 minutes counseling the patient face to face. The total time spent in the appointment was 55 minutes and more than 50% was on counseling and review of test results  NHeath Lark MD 04/10/2018 7:35 AM

## 2018-04-10 NOTE — Telephone Encounter (Signed)
Oral Oncology [facility] Encounter  I was successful at securing a grant with [facility] for $8,000. This will keep the out of pocket expense for [place] at $0. The grant information is as follows and has been shared with [place].  Approval dates: 03/11/18-03/11/19 ID: [id number] Group: [id number] BIN: [id number] PCN: [id number]  I spoke to the [facility] and gave him this information, he verbalized understanding and great appreciation.  Ryan [facility] Ryan [facility] Phone [phone] Fax [phone] 04/10/2018   3:43 PM

## 2018-04-10 NOTE — Assessment & Plan Note (Signed)
He has received IVIG recently. Due to plan to start him on chemotherapy, I do not plan to repeat IVIG treatment for now

## 2018-04-10 NOTE — Assessment & Plan Note (Signed)
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 N Engl J Med [phone]. DOI: 10.1056/[id number]   BACKGROUND  Irreversible inhibition of Bruton's tyrosine kinase (BTK) by ibrutinib represents an important therapeutic advance for the treatment of chronic lymphocytic leukemia (CLL). However, ibrutinib also irreversibly inhibits alternative kinase targets, which potentially compromises its therapeutic index. Acalabrutinib (ACP-196) is a more selective, irreversible BTK inhibitor that is specifically designed to improve on the safety and efficacy of first-generation BTK inhibitors.  METHODS  In this uncontrolled, phase 1-2, multicenter study, we administered oral acalabrutinib to 61 patients who had relapsed CLL to assess the safety, efficacy, pharmacokinetics, and pharmacodynamics of acalabrutinib. Patients were treated with acalabrutinib at a dose of 100 to 400 mg once daily in the dose-escalation (phase 1) portion of the study and 100 mg twice daily in the expansion (phase 2) portion.  RESULTS  The median age of the patients was 5 years, and patients had received a median of three previous therapies for CLL; 31% had chromosome 17p13.1 deletion, and 75% had unmutated immunoglobulin heavy-chain variable genes. No dose-limiting toxic effects occurred during the dose-escalation portion of the study. The most common adverse events observed were headache  (in 43% of the patients), diarrhea (in 39%), and increased weight (in 26%). Most adverse events were of grade 1 or 2. At a median followup of 14.3 months, the overall response rate was 95%, including 85% with a partial response and 10% with a partial response with lymphocytosis; the remaining 5% of patients had stable disease. Among patients with chromosome 17p13.1 deletion, the overall response rate was 100%. No cases of Richter's transformation (CLL that has evolved into large-cell lymphoma) and only one case of CLL progression have occurred.  CONCLUSIONS  In this study, the selective BTK inhibitor acalabrutinib had promising safety and efficacy profiles in patients with relapsed CLL, including those with chromosome 17p13.1 deletion. (Funded by the [employer] and others; Midwife.gov number, ZJQ73419379.)  We discussed some of the risks, benefits and side-effects of Acalabrutinib Treatment intent is palliative  Some of the short term side-effects included, though not limited to, risk of fatigue, weight loss, tumor lysis syndrome, risk of allergic reactions, pancytopenia, life-threatening infections, need for transfusions of blood products, nausea, vomiting, change in bowel habits, admission to hospital for various reasons, and risks of death.   Long term side-effects are also discussed including permanent damage to nerve function, chronic fatigue, and rare secondary malignancy including bone marrow disorders.   The patient is aware that the response rates discussed earlier is not guaranteed.    After a long discussion, patient made an informed decision to proceed with the prescribed plan of care.   Patient education material was dispensed  I will get assistance from pharmacist for dosing and medication interaction The patient is currently taking proton pump inhibitor which may interact with the medications He will continue acyclovir We will start him on allopurinol for tumor lysis  prophylaxis when he is ready to start treatment He is aware that he will  be coming in weekly in the first month for blood count monitoring and I will see him within 2 weeks for toxicity review I will get baseline EKG before he leaves today

## 2018-04-10 NOTE — Assessment & Plan Note (Signed)
He has persistent pancytopenia due CLL, liver disease and chronic splenomegaly He is not symptomatic except for minor bruising.  Observe only for now.

## 2018-04-10 NOTE — Telephone Encounter (Signed)
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  Original diagnosis in 2011 Patient received treatment with FCR x6 cycles from May to October 2014 Ibrutinib was offered in August 2018 for disease progression, patient decided against treatment with Imbruvica at that time due to prohibitive cost And patient started on treatment with bendamustine and rituximab August 2018 through January 2019  Patient noted with recurrence in February 2020 and is under evaluation to start third line treatment with Calquence dosed at 100 mg by mouth twice daily  Labs from 03/30/2018 assessed, OK for treatment. Noted baseline thrombocytopenia, pltc=68k, patient will be monitored closely at treatment initiation  Current medication list in Epic reviewed, DDI with Calquence and Nexium identified:  Category X interaction: Proton pump inhibitors may decrease his serum concentration of acalabrutinib.  Manufacturer recommends to avoid concurrent use of acalabrutinib with any PPI.  Due to the long half-life of PPIs, separating administration of these agents is not likely to eliminate interaction. Patient will be screened for PPI discontinuation. An H2RA or antacid can be utilized with separation from [employer] administration.  Prescription will be e-scribed to the [facility] for benefits analysis and approval once received by MD.  Oral Oncology Clinic will continue to follow for insurance authorization, copayment issues, initial counseling and start date.  Johny Drilling, PharmD, BCPS, BCOP  04/10/2018 7:39 AM Oral Oncology Clinic [phone]

## 2018-04-10 NOTE — Telephone Encounter (Signed)
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   Counseled patient on administration, dosing, side effects, monitoring, drug-food interactions, safe handling, storage, and disposal.  Patient will take Calquence 100mg  capsules, 1 capsule by mouth approximately 12 hours apart, with or with out food, with a glass of water.  Patient knows to avoid acid suppressing agents, if needed, may seperate use of an H2RA blocker or antacid by 2 hours from Calquence admisinstration.  Patient is willing to discontinue use of Nexium twice daily and to try substitution with Pepcid 20 mg twice daily with the use of Tums as rescue medication for continued reflux symptoms. Patient will alert the office if gastric issues are uncontrolled with this regimen.  Calquence start date: 04/13/2018  Adverse effects include but are not limited to: headache, diarrhea, fatigue, rash, muscle pain, bruising, decreased blood counts, and altered cardiac conduction.    Reviewed with patient importance of keeping a medication schedule and plan for any missed doses.  Medication reconciliation performed and medication/allergy list updated.  Insurance authorization for [employer] is not required at this time. Test claim at the pharmacy revealed copayment ~ $2900 for first fill of Calquence. Oral oncology patient advocate was successful in securing foundation copayment grant to cover out-of-pocket expenses for Calquence. Calquence will ship from the [facility] pharmacy tomorrow, 04/11/2018, for delivery to patient's home on Thursday. Patient will start his Calquence on Friday, 04/13/2018.  Prescriptions for Pepcid 20 mg twice daily, allopurinol 300 mg once daily, and Imodium to use as needed for watery or loose stools  has been E scribed to local [facility] in [place] per patient request.  We extensively discussed interaction with acid suppressing agents. Patient will alert the office if his gastric symptoms are uncontrolled when switching off of PPI.  We briefly discussed other oral treatment options for patient's CLL including idelalisib and venetoclax.  Patient informed that Dr. Simeon Craft such will be scheduling close follow-up after initiation of Calquence. He will await a call from the office for follow-up scheduling.  All questions answered. Patient expressed understanding and appreciation.  Patient knows to call the office with questions or concerns. Oral Oncology Clinic will continue to follow.  Johny Drilling, PharmD, BCPS, BCOP  04/10/2018   2:27 PM Oral Oncology Clinic [phone]

## 2018-04-11 MED FILL — CALQUENCE 100 MG CAPSULE: 100 | 30 days supply | Qty: 60 | Fill #0

## 2018-04-12 ENCOUNTER — Other Ambulatory Visit: Payer: Self-pay | Admitting: Hematology and Oncology

## 2018-04-12 ENCOUNTER — Telehealth: Payer: Self-pay | Admitting: Hematology and Oncology

## 2018-04-12 DIAGNOSIS — C8308 Small cell B-cell lymphoma, lymph nodes of multiple sites: Secondary | ICD-10-CM

## 2018-04-12 NOTE — Telephone Encounter (Signed)
Scheduled appt per 2/27 sch message - left message for patient with appt date and time

## 2018-04-12 NOTE — Telephone Encounter (Signed)
Hi Hassan Rowan,  Can you call and tell him to start Monday 3/2 I can see him the following week; I recommend early am appt rather than 4 pm appt If ok with him I will send scheduling msg

## 2018-04-12 NOTE — Telephone Encounter (Signed)
Oral Oncology Patient Advocate Encounter  Confirmed with [place] that [place] was shipped on 04/11/18 with a $0 copay using [place].   [facility] [place] Phone [phone] Fax [phone] 04/12/2018   8:31 AM

## 2018-04-12 NOTE — Telephone Encounter (Signed)
Called and given below message. He verbalized understanding. He okay with the appt per message.

## 2018-04-17 ENCOUNTER — Other Ambulatory Visit: Payer: Self-pay

## 2018-04-17 ENCOUNTER — Emergency Department (HOSPITAL_COMMUNITY): Payer: Medicare Other

## 2018-04-17 ENCOUNTER — Inpatient Hospital Stay (HOSPITAL_COMMUNITY)
Admission: EM | Admit: 2018-04-17 | Discharge: 2018-04-21 | DRG: 871 | Disposition: A | Discharge status: Home Health | Payer: Medicare Other | Attending: Internal Medicine | Admitting: Internal Medicine

## 2018-04-17 DIAGNOSIS — Z885 Allergy status to narcotic agent status: Secondary | ICD-10-CM

## 2018-04-17 DIAGNOSIS — J449 Chronic obstructive pulmonary disease, unspecified: Secondary | ICD-10-CM | POA: Diagnosis not present

## 2018-04-17 DIAGNOSIS — Z801 Family history of malignant neoplasm of trachea, bronchus and lung: Secondary | ICD-10-CM

## 2018-04-17 DIAGNOSIS — E1151 Type 2 diabetes mellitus with diabetic peripheral angiopathy without gangrene: Secondary | ICD-10-CM | POA: Diagnosis present

## 2018-04-17 DIAGNOSIS — A4189 Other specified sepsis: Secondary | ICD-10-CM | POA: Diagnosis not present

## 2018-04-17 DIAGNOSIS — F419 Anxiety disorder, unspecified: Secondary | ICD-10-CM | POA: Diagnosis present

## 2018-04-17 DIAGNOSIS — I252 Old myocardial infarction: Secondary | ICD-10-CM

## 2018-04-17 DIAGNOSIS — R509 Fever, unspecified: Secondary | ICD-10-CM | POA: Diagnosis not present

## 2018-04-17 DIAGNOSIS — Z8249 Family history of ischemic heart disease and other diseases of the circulatory system: Secondary | ICD-10-CM

## 2018-04-17 DIAGNOSIS — J9601 Acute respiratory failure with hypoxia: Secondary | ICD-10-CM | POA: Diagnosis not present

## 2018-04-17 DIAGNOSIS — E119 Type 2 diabetes mellitus without complications: Secondary | ICD-10-CM

## 2018-04-17 DIAGNOSIS — I5032 Chronic diastolic (congestive) heart failure: Secondary | ICD-10-CM | POA: Diagnosis not present

## 2018-04-17 DIAGNOSIS — J101 Influenza due to other identified influenza virus with other respiratory manifestations: Secondary | ICD-10-CM | POA: Diagnosis present

## 2018-04-17 DIAGNOSIS — Z87891 Personal history of nicotine dependence: Secondary | ICD-10-CM

## 2018-04-17 DIAGNOSIS — K219 Gastro-esophageal reflux disease without esophagitis: Secondary | ICD-10-CM | POA: Diagnosis present

## 2018-04-17 DIAGNOSIS — Z7951 Long term (current) use of inhaled steroids: Secondary | ICD-10-CM

## 2018-04-17 DIAGNOSIS — T451X5A Adverse effect of antineoplastic and immunosuppressive drugs, initial encounter: Secondary | ICD-10-CM | POA: Diagnosis present

## 2018-04-17 DIAGNOSIS — C8308 Small cell B-cell lymphoma, lymph nodes of multiple sites: Secondary | ICD-10-CM | POA: Diagnosis present

## 2018-04-17 DIAGNOSIS — M549 Dorsalgia, unspecified: Secondary | ICD-10-CM | POA: Diagnosis present

## 2018-04-17 DIAGNOSIS — Z7984 Long term (current) use of oral hypoglycemic drugs: Secondary | ICD-10-CM

## 2018-04-17 DIAGNOSIS — I2510 Atherosclerotic heart disease of native coronary artery without angina pectoris: Secondary | ICD-10-CM | POA: Diagnosis present

## 2018-04-17 DIAGNOSIS — D6959 Other secondary thrombocytopenia: Secondary | ICD-10-CM | POA: Diagnosis present

## 2018-04-17 DIAGNOSIS — D696 Thrombocytopenia, unspecified: Secondary | ICD-10-CM | POA: Diagnosis present

## 2018-04-17 DIAGNOSIS — G8929 Other chronic pain: Secondary | ICD-10-CM | POA: Diagnosis present

## 2018-04-17 DIAGNOSIS — E785 Hyperlipidemia, unspecified: Secondary | ICD-10-CM | POA: Diagnosis present

## 2018-04-17 DIAGNOSIS — A419 Sepsis, unspecified organism: Secondary | ICD-10-CM | POA: Diagnosis present

## 2018-04-17 LAB — CBC WITH DIFFERENTIAL/PLATELET
ABS IMMATURE GRANULOCYTES: 0 10*3/uL (ref 0.00–0.07)
Basophils Absolute: 0 10*3/uL (ref 0.0–0.1)
Basophils Relative: 0 %
EOS ABS: 0 10*3/uL (ref 0.0–0.5)
Eosinophils Relative: 0 %
HCT: 41.6 % (ref 39.0–52.0)
Hemoglobin: 13.4 g/dL (ref 13.0–17.0)
Lymphocytes Relative: 69 %
Lymphs Abs: 12.1 10*3/uL — ABNORMAL HIGH (ref 0.7–4.0)
MCH: 33.8 pg (ref 26.0–34.0)
MCHC: 32.2 g/dL (ref 30.0–36.0)
MCV: 104.8 fL — ABNORMAL HIGH (ref 80.0–100.0)
Monocytes Absolute: 0.5 10*3/uL (ref 0.1–1.0)
Monocytes Relative: 3 %
Neutro Abs: 4.9 10*3/uL (ref 1.7–7.7)
Neutrophils Relative %: 28 %
Platelets: 62 10*3/uL — ABNORMAL LOW (ref 150–400)
RBC: 3.97 MIL/uL — ABNORMAL LOW (ref 4.22–5.81)
RDW: 18.3 % — ABNORMAL HIGH (ref 11.5–15.5)
WBC: 17.5 10*3/uL — ABNORMAL HIGH (ref 4.0–10.5)
nRBC: 0.1 % (ref 0.0–0.2)

## 2018-04-17 LAB — COMPREHENSIVE METABOLIC PANEL
ALT: 10 U/L (ref 0–44)
AST: 17 U/L (ref 15–41)
Albumin: 3.6 g/dL (ref 3.5–5.0)
Alkaline Phosphatase: 72 U/L (ref 38–126)
Anion gap: 7 (ref 5–15)
BUN: 14 mg/dL (ref 8–23)
CO2: 23 mmol/L (ref 22–32)
Calcium: 8.3 mg/dL — ABNORMAL LOW (ref 8.9–10.3)
Chloride: 105 mmol/L (ref 98–111)
Creatinine, Ser: 1.11 mg/dL (ref 0.61–1.24)
GFR calc Af Amer: >60 mL/min (ref >60)
GFR calc non Af Amer: >60 mL/min (ref >60)
Glucose, Bld: 125 mg/dL — ABNORMAL HIGH (ref 70–99)
Potassium: 4.3 mmol/L (ref 3.5–5.1)
Sodium: 135 mmol/L (ref 135–145)
Total Bilirubin: 1.1 mg/dL (ref 0.3–1.2)
Total Protein: 6.2 g/dL — ABNORMAL LOW (ref 6.5–8.1)

## 2018-04-17 LAB — INFLUENZA PANEL BY PCR (TYPE A & B)
INFLBPCR: NEGATIVE
Influenza A By PCR: POSITIVE — AB

## 2018-04-17 MED ORDER — SODIUM CHLORIDE 0.9 % IV BOLUS (SEPSIS)
1000.0000 mL | Freq: Once | INTRAVENOUS | Status: AC
  Administered 2018-04-17: 1000 mL via INTRAVENOUS

## 2018-04-17 MED ORDER — OSELTAMIVIR PHOSPHATE 75 MG PO CAPS
75.0000 mg | ORAL_CAPSULE | Freq: Once | ORAL | Status: AC
  Administered 2018-04-17: 75 mg via ORAL
  Filled 2018-04-17: qty 1

## 2018-04-17 MED ORDER — SODIUM CHLORIDE 0.9 % IV SOLN
500.0000 mg | INTRAVENOUS | Status: DC
  Administered 2018-04-17: 500 mg via INTRAVENOUS
  Filled 2018-04-17: qty 500

## 2018-04-17 MED ORDER — SODIUM CHLORIDE 0.9 % IV BOLUS (SEPSIS)
1000.0000 mL | Freq: Once | INTRAVENOUS | Status: AC
  Administered 2018-04-18: 1000 mL via INTRAVENOUS

## 2018-04-17 MED ORDER — OXYCODONE HCL 5 MG PO TABS
5.0000 mg | ORAL_TABLET | Freq: Once | ORAL | Status: AC
  Administered 2018-04-17: 5 mg via ORAL
  Filled 2018-04-17: qty 1

## 2018-04-17 MED ORDER — SODIUM CHLORIDE 0.9 % IV SOLN
2.0000 g | INTRAVENOUS | Status: DC
  Administered 2018-04-17: 2 g via INTRAVENOUS
  Filled 2018-04-17: qty 20

## 2018-04-17 MED ORDER — ACETAMINOPHEN 500 MG PO TABS
1000.0000 mg | ORAL_TABLET | Freq: Once | ORAL | Status: AC
  Administered 2018-04-17: 1000 mg via ORAL
  Filled 2018-04-17: qty 2

## 2018-04-17 NOTE — ED Notes (Signed)
Bed: ER15 Expected date:  Expected time:  Means of arrival:  Comments: EMS cancer patient

## 2018-04-17 NOTE — ED Provider Notes (Signed)
[facility] DEPT Provider Note   CSN: [id number] Arrival date & time: 04/17/18  2027    History   Chief Complaint Chief Complaint  Patient presents with  . Fever  . Weakness    HPI Ryan Copeland is a 68 y.o. male.     HPI  Presents for evaluation of weakness, which caused him to slide out of bed to the floor.  No injuries.  He also complains of sneezing and coughing today.  He took a dose of chemotherapy orally today.  Apparently this was his second dose.  He denies nausea, vomiting, focal weakness or paresthesia.  No known sick contacts.  He reports a rash on both legs, below the knees, for 3 months.  There are no other known modifying factors.  Past Medical History:  Diagnosis Date  . Anxiety 01/30/2014  . Back injury    lower disc  . CAD (coronary artery disease)   . CLL (chronic lymphocytic leukemia) ([place]) 03/18/2011  . Diabetes mellitus ([place])    Type 2   . ECRB (extensor carpi radialis brevis) tenosynovitis   . GERD (gastroesophageal reflux disease)    takes Nexium if needed  . Hyperlipidemia   . Lateral epicondylitis of left elbow   . MI, acute, non ST segment elevation ([place]) 06/28/2009   with stenting of the LAD  . Neuromuscular disorder ([place])    peripheral neuropathy  . PVD (peripheral vascular disease) ([place])   . Tobacco abuse     Patient Active Problem List   Diagnosis Date Noted  . Skin rash 08/07/2017  . Port-A-Cath in place 08/04/2017  . 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 Retained dental roots 04/26/2017  . Dental caries 04/26/2017  . 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 Loose, teeth 04/26/2017  . Peripheral polyneuropathy 04/14/2017  . 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 Chronic sinusitis 01/27/2017  . Splenomegaly, congestive, chronic 12/29/2016  . Diarrhea 12/01/2016  . Poor dentition 12/01/2016  . Benign mole 12/01/2016  . Pancytopenia, acquired ([place]) 11/03/2016  . Odynophagia 01/15/2016  . Polycythemia,  secondary 06/26/2015  . Quality of life palliative care encounter 06/26/2015  . Atherosclerosis of extremity with intermittent claudication ([place]) 01/23/2015  . Lateral epicondylitis of left elbow   . ECRB (extensor carpi radialis brevis) tenosynovitis   . Anxiety 01/30/2014  . Preoperative clearance 01/30/2014  . Thrombocytopenia ([place]) 01/30/2014  . DM (diabetes mellitus) type II controlled peripheral vascular disorder ([place]) 08/02/2013  . 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 Hereditary and idiopathic peripheral neuropathy 08/20/2012  . 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 CAD (coronary artery disease) 07/29/2011  . 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 MI, acute, non ST segment elevation ([place])   . Hyperlipidemia   . PVD (peripheral vascular disease) ([place])   . GERD 09/27/2008  . SHOULDER STRAIN, RIGHT 09/27/2008    Past Surgical History:  Procedure Laterality Date  . ADENOIDECTOMY  1955  . CARDIAC CATHETERIZATION    . CARDIAC CATHETERIZATION N/A 09/26/2014   Procedure: Left Heart Cath and Coronary Angiography;  Surgeon: Peter M Martinique, MD;  Location: [facility];  Service: Cardiovascular;  Laterality: N/A;  . carpel tunnel release Left 04-1989  . carpel tunnel release  Right 01-1989  . CHOLECYSTECTOMY  2007  . CORONARY STENT PLACEMENT  May 2011  . femoral stents    . IR CV LINE INJECTION  08/18/2017  . IR CV LINE INJECTION  09/01/2017  . IR CV LINE INJECTION  02/02/2018  . LATERAL EPICONDYLE RELEASE Left 02/12/2014   Procedure: LEFT ELBOW DEBRIDEMENT WITH TENDON REPAIR ;  Surgeon: Lorn Junes, MD;  Location: [facility];  Service: Orthopedics;  Laterality: Left;  . LEFT CAI STENT/PTA AND POPLITEAL ARTERY/TIBIAL THROMBECTOMY     . LEFT HEART CATHETERIZATION WITH CORONARY ANGIOGRAM N/A 08/26/2011   Procedure: LEFT HEART CATHETERIZATION WITH CORONARY ANGIOGRAM;  Surgeon: Peter M Martinique, MD;  Location: [facility] C[facility]  LAB;  Service: Cardiovascular;  Laterality: N/A;  . PERIPHERAL VASCULAR CATHETERIZATION N/A 01/01/2015   Procedure: Abdominal Aortogram;  Surgeon: Conrad Sun, MD;  Location: [facility];  Service: Cardiovascular;  Laterality: N/A;  . TARSAL TUNNEL RELEASE Bilateral 08-2007        Home Medications    Prior to Admission medications   Medication Sig Start Date End Date Taking? Authorizing Provider  Acalabrutinib (CALQUENCE) 100 MG CAPS Take 100 mg by mouth 2 (two) times daily. 04/10/18  Yes [name], MD  acetaminophen (TYLENOL) 500 MG tablet Take 500 mg by mouth every 6 (six) hours as needed for mild pain, moderate pain or headache.   Yes [provider]  acyclovir (ZOVIRAX) 400 MG tablet Take 1 tablet (400 mg total) by mouth 2 (two) times daily. 02/02/18  Yes Gorsuch, Ni, MD  allopurinol (ZYLOPRIM) 300 MG tablet Take 1 tablet (300 mg total) by mouth daily. 04/10/18  Yes Gorsuch, Ni, MD  ALPRAZolam (XANAX) 0.25 MG tablet Take 0.25 mg by mouth as needed for anxiety.   Yes [provider]  b complex vitamins tablet Take 1 tablet by mouth daily.    Yes [provider]  benzonatate (TESSALON) 100 MG capsule Take 100 mg by mouth as needed for cough.  12/07/17  Yes [provider]  Cholecalciferol (VITAMIN D-1000 MAX ST) 1000 units tablet Take 1,000 Units by mouth daily.    Yes [provider]  fluticasone (FLONASE) 50 MCG/ACT nasal spray Place 2 sprays into the nose daily.  06/07/16  Yes [provider]  furosemide (LASIX) 20 MG tablet Take 1 tablet (20 mg total) by mouth daily as needed. 12/11/17 04/17/18 Yes Burtis Junes, NP  HYDROcodone-acetaminophen (NORCO) 10-325 MG tablet Take 1 tablet by mouth See admin instructions. Every 6 to 8 hours as needed for pain 04/14/15  Yes [provider]  lidocaine-prilocaine (EMLA) cream APPLY TO AFFECTED AREA ONCE Patient taking differently: Apply 1 application topically once.  12/21/17   Yes Nicholas Lose, MD  meclizine (ANTIVERT) 12.5 MG tablet Take 12.5 mg by mouth every 6 (six) hours as needed for dizziness.  12/24/14  Yes [provider]  metFORMIN (GLUCOPHAGE-XR) 500 MG 24 hr tablet Take 500 mg by mouth See admin instructions. Takes one tablet in AM and 2 tablets at night.   Yes [provider]  metoprolol succinate (TOPROL-XL) 25 MG 24 hr tablet TAKE 1 TABLET (25 MG TOTAL) BY MOUTH DAILY. 02/06/18  Yes Burtis Junes, NP  nortriptyline (PAMELOR) 25 MG capsule TAKE 4 CAPSULES BY MOUTH AT BEDTIME. Patient taking differently: Take 100 mg by mouth at bedtime.  12/20/17  Yes Marcial Pacas, MD  pregabalin (LYRICA) 200 MG capsule TAKE 1 CAPSULE BY MOUTH THREE TIMES A DAY AND 2 CAPSULES AT BEDTIME Patient taking differently: Take 200 mg by mouth See admin instructions. TAKE 1 CAPSULE BY MOUTH THREE TIMES A DAY AND 2 CAPSULES AT BEDTIME 11/09/17  Yes Marcial Pacas, MD  PROVENTIL [facility]  108 (90 Base) MCG/ACT inhaler TAKE 2 PUFFS BY MOUTH EVERY 6 HOURS AS NEEDED FOR WHEEZE OR SHORTNESS OF BREATH Patient taking differently: Inhale 2 puffs into the lungs every 6 (six) hours as needed for wheezing or shortness of breath.  06/26/17  Yes Gorsuch, Ni, MD  rosuvastatin (CRESTOR) 5 MG tablet TAKE 1 TABLET (5 MG TOTAL) BY MOUTH DAILY. 02/06/18  Yes Burtis Junes, NP  famotidine (PEPCID) 20 MG tablet Take 1 tablet (20 mg total) by mouth 2 (two) times daily. 04/10/18   [name], MD  loperamide (IMODIUM) 2 MG capsule Take 2 capsules (4 mg total) by mouth as needed for diarrhea or loose stools. 04/10/18   [name], MD  nitroGLYCERIN (NITROSTAT) 0.4 MG SL tablet PLACE 1 TABLET UNDER THE TONGUE EVERY 5 MINUTES X 3 DOSES AS NEEDED FOR CHEST PAIN *MAX 3 DOSES* Patient taking differently: Place 0.4 mg under the tongue every 5 (five) minutes as needed for chest pain.  02/09/18   Burtis Junes, NP  sildenafil (VIAGRA) 100 MG tablet TAKE 1/2-1 TABLET DAILY AS NEEDED FOR ERECTILE DYSFUNCTION  (30 MINUTES PRIOR TO SEXUAL INTERCOURSE) Patient taking differently: Take 50-100 mg by mouth as needed for erectile dysfunction.  06/08/17   Burtis Junes, NP    Family History Family History  Problem Relation Age of Onset  . Lung cancer Mother 2  . Cancer Mother        lung  . Heart failure Father 74  . Heart disease Father     Social History Social History   Tobacco Use  . Smoking status: Former Smoker    Packs/day: 0.50    Years: 44.00    Pack years: 22.00    Types: Cigarettes    Last attempt to quit: 04/23/2016    Years since quitting: 1.9  . Smokeless tobacco: Former Systems developer  . Tobacco comment: but does have ocassional relapses  Substance Use Topics  . Alcohol use: No    Alcohol/week: 0.0 standard drinks    Comment:  drinks non-alcoholic beer  . Drug use: No     Allergies   Codeine   Review of Systems Review of Systems  All other systems reviewed and are negative.    Physical Exam Updated Vital Signs BP (!) 97/51   Pulse 78   Temp 99.9 F (37.7 C) (Oral)   Resp 18   Ht 6' (1.829 m)   Wt 99.8 kg   SpO2 96%   BMI 29.84 kg/m   Physical Exam Vitals signs and nursing note reviewed.  Constitutional:      General: He is in acute distress (He is uncomfortable).     Appearance: Normal appearance. He is well-developed. He is not ill-appearing, toxic-appearing or diaphoretic.  HENT:     Head: Normocephalic and atraumatic.     Right Ear: External ear normal.     Left Ear: External ear normal.     Mouth/Throat:     Mouth: Mucous membranes are moist.     Pharynx: No oropharyngeal exudate or posterior oropharyngeal erythema.  Eyes:     Conjunctiva/sclera: Conjunctivae normal.     Pupils: Pupils are equal, round, and reactive to light.  Neck:     Musculoskeletal: Normal range of motion and neck supple.     Trachea: Phonation normal.  Cardiovascular:     Rate and Rhythm: Normal rate and regular rhythm.     Heart sounds: Normal heart sounds.    Pulmonary:  Effort: Pulmonary effort is normal. No respiratory distress.     Breath sounds: Normal breath sounds. No stridor. No rhonchi.  Abdominal:     General: There is no distension.     Palpations: Abdomen is soft.     Tenderness: There is no abdominal tenderness.  Musculoskeletal: Normal range of motion.  Skin:    General: Skin is warm and dry.     Coloration: Skin is not pale.     Comments: Heel rash lower legs bilaterally.  Mild erythema of the abdomen without significant rash noted at that site.  Neurological:     Mental Status: He is alert and oriented to person, place, and time.     Cranial Nerves: No cranial nerve deficit.     Sensory: No sensory deficit.     Motor: No weakness or abnormal muscle tone.     Coordination: Coordination normal.  Psychiatric:        Mood and Affect: Mood normal.        Behavior: Behavior normal.        Thought Content: Thought content normal.        Judgment: Judgment normal.      ED Treatments / Results  Labs (all labs ordered are listed, but only abnormal results are displayed) Labs Reviewed  COMPREHENSIVE METABOLIC PANEL - Abnormal; Notable for the following components:      Result Value   Glucose, Bld 125 (*)    Calcium 8.3 (*)    Total Protein 6.2 (*)    All other components within normal limits  CBC WITH DIFFERENTIAL/PLATELET - Abnormal; Notable for the following components:   WBC 17.5 (*)    RBC 3.97 (*)    MCV 104.8 (*)    RDW 18.3 (*)    Platelets 62 (*)    Lymphs Abs 12.1 (*)    All other components within normal limits  INFLUENZA PANEL BY PCR (TYPE A & B) - Abnormal; Notable for the following components:   Influenza A By PCR POSITIVE (*)    All other components within normal limits  CULTURE, BLOOD (ROUTINE X 2)  CULTURE, BLOOD (ROUTINE X 2)  URINE CULTURE  URINALYSIS, ROUTINE W REFLEX MICROSCOPIC  LACTIC ACID, PLASMA  LACTIC ACID, PLASMA    EKG EKG Interpretation  Date/Time:  Tuesday April 17 2018  20:42:07 EST Ventricular Rate:  95 PR Interval:    QRS Duration: 91 QT Interval:  331 QTC Calculation: 416 R Axis:   70 Text Interpretation:  Sinus rhythm since last tracing no significant change Confirmed by Daleen Bo [phone]) on 04/17/2018 11:37:20 PM   Radiology Dg Chest Port 1 View  Result Date: 04/17/2018 CLINICAL DATA:  Fever. Weakness. EXAM: PORTABLE CHEST 1 VIEW COMPARISON:  Chest CT 04/06/2018 FINDINGS: Tip of the right chest port in the mid SVC. Upper normal heart size with unchanged mediastinal contours. Aortic atherosclerosis. No focal airspace disease. No pulmonary edema, pneumothorax, or large pleural effusion. No acute osseous abnormalities are seen. IMPRESSION: No acute chest findings. Electronically Signed   By: Keith Rake M.D.   On: 04/17/2018 21:23    Procedures .Critical Care Performed by: Daleen Bo, MD Authorized by: Daleen Bo, MD   Critical care provider statement:    Critical care time (minutes):  45   Critical care start time:  04/17/2018 8:45 PM   Critical care end time:  04/17/2018 11:42 PM   Critical care time was exclusive of:  Separately billable procedures and treating other patients  Critical care was necessary to treat or prevent imminent or life-threatening deterioration of the following conditions:  Sepsis   Critical care was time spent personally by me on the following activities:  Blood draw for specimens, development of treatment plan with patient or surrogate, discussions with consultants, evaluation of patient's response to treatment, examination of patient, obtaining history from patient or surrogate, ordering and performing treatments and interventions, ordering and review of laboratory studies, pulse oximetry, re-evaluation of patient's condition, review of old charts and ordering and review of radiographic studies   (including critical care time)  Medications Ordered in ED Medications  cefTRIAXone (ROCEPHIN) 2 g in sodium  chloride 0.9 % 100 mL IVPB (2 g Intravenous New Bag/Given 04/17/18 2144)  azithromycin (ZITHROMAX) 500 mg in sodium chloride 0.9 % 250 mL IVPB (500 mg Intravenous New Bag/Given 04/17/18 2237)  sodium chloride 0.9 % bolus 1,000 mL (1,000 mLs Intravenous New Bag/Given 04/17/18 2237)    And  sodium chloride 0.9 % bolus 1,000 mL (1,000 mLs Intravenous New Bag/Given 04/17/18 2142)    And  sodium chloride 0.9 % bolus 1,000 mL (has no administration in time range)  acetaminophen (TYLENOL) tablet 1,000 mg (1,000 mg Oral Given 04/17/18 2100)  oseltamivir (TAMIFLU) capsule 75 mg (75 mg Oral Given 04/17/18 2335)  oxyCODONE (Oxy IR/ROXICODONE) immediate release tablet 5 mg (5 mg Oral Given 04/17/18 2335)     Initial Impression / Assessment and Plan / ED Course  I have reviewed the triage vital signs and the nursing notes.  Pertinent labs & imaging results that were available during my care of the patient were reviewed by me and considered in my medical decision making (see chart for details).  Clinical Course as of Apr 17 2339  Tue Apr 17, 2018  2056 Sepsis treatment and evaluation ordered   [EW]  2251 Normal except glucose high, calcium low, total protein low  Comprehensive metabolic panel(!) [EW]  5784 Abnormal, influenza A present  Influenza panel by PCR (type A & B)(!) [EW]  2251 Abnormal, elevated white count, and MCV; platelets low  CBC WITH DIFFERENTIAL(!) [EW]  2251 No infiltrate, or CHF, images reviewed by me  DG Chest [place] 1 View [EW]    Clinical Course User Index [EW] Daleen Bo, MD        Patient Vitals for the past 24 hrs:  BP Temp Temp src Pulse Resp SpO2 Height Weight  04/17/18 2300 (!) 97/51 - - 78 18 96 % - -  04/17/18 2230 (!) 86/55 99.9 F (37.7 C) Oral 81 (!) 21 95 % - -  04/17/18 2056 - - - - - - 6' (1.829 m) 99.8 kg  04/17/18 2055 - - - - - 94 % - -  04/17/18 2045 115/66 (!) 103 F (39.4 C) Oral 94 10 92 % - -    11:38 PM Reevaluation with update and discussion. After  initial assessment and treatment, an updated evaluation reveals patient is complaining of back pain related to lying on stretcher.  He denies shortness of breath, dizziness or weakness at this time.Daleen Bo   Sepsis - Repeat Assessment  Performed at:    11:20 PM  Vitals     Blood pressure (!) 97/51, pulse 78, temperature 99.9 F (37.7 C), temperature source Oral, resp. rate 18, height 6' (1.829 m), weight 99.8 kg, SpO2 96 %.  Heart:     Regular rate and rhythm  Lungs:    CTA  Capillary Refill:   <2 sec  Peripheral Pulse:   Radial pulse palpable  Skin:     Normal Color    Medical Decision Making: SIRS with sepsis.  No evidence for pneumonia.  Patient is influenza A.  He has been treated with broad-spectrum antibiotics for community-acquired pneumonia.  He requires admission for observation further treatment.  CRITICAL CARE-yes Performed by: Daleen Bo  Nursing Notes Reviewed/ Care Coordinated Applicable Imaging Reviewed Interpretation of Laboratory Data incorporated into ED treatment   12:25 AM-Consult complete with hospitalist. Patient case explained and discussed.  He agrees to admit patient for further evaluation and treatment. Call ended at 12:35 AM  Plan: Admit   Final Clinical Impressions(s) / ED Diagnoses   Final diagnoses:  None    ED Discharge Orders    None       Daleen Bo, MD 04/18/18 [phone]

## 2018-04-17 NOTE — H&P (Signed)
History and Physical    Ryan Copeland BTD:[id number] DOB: 19-Feb-1950 DOA: 04/17/2018  Referring MD/NP/PA:   PCP: [name]., PA-C   Patient coming from:  The patient is coming from home.  At baseline, pt is independent for most of ADL.        Chief Complaint: Fever, chills, body aches, runny nose  HPI: Ryan Copeland is a 68 y.o. 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  Patient states that his symptoms started today, including fever, chills, body aches, runny nose, dry cough, denies chest pain, shortness breath, sore throat.  Patient does not have nausea, vomiting, diarrhea, abdominal pain, symptoms of UTI or unilateral weakness.  He states that he just started Calquence for small B-cell lymphoma yesterday.  Patient was initially hypotensive, and was given 4 L normal saline bolus in the ED, blood pressure improved to 97/51.  ED Course: pt was found to have positive flu a PCR, WBC 17.5, creatinine 1.11, BUN 14, temperature 103, no tachycardia, oxygen sat 92 to 96% on room air.  Chest x-ray negative.  Patient is admitted to stepdown as inpatient.  Review of Systems:   General: Has fevers, chills, no body weight gain, has fatigue and body aches HEENT: no blurry vision, hearing changes or sore throat Respiratory: no dyspnea, has coughing, no wheezing CV: no chest pain, no palpitations GI: no nausea, vomiting, abdominal pain, diarrhea, constipation GU: no dysuria, burning on urination, increased urinary frequency, hematuria  Ext: Has trace leg edema Neuro: no unilateral weakness, numbness, or tingling, no vision change or hearing loss Skin: no rash, no skin tear. MSK: No muscle spasm, no deformity, no limitation of range of movement in spin Heme: No easy  bruising.  Travel history: No recent long distant travel.  Allergy:  Allergies  Allergen Reactions  . Codeine Hives    Pt states he can take a few, more reaction with extended doses.    Past Medical History:  Diagnosis Date  . Anxiety 01/30/2014  . Back injury    lower disc  . CAD (coronary artery disease)   . CLL (chronic lymphocytic leukemia) ([place]) 03/18/2011  . Diabetes mellitus ([place])    Type 2   . ECRB (extensor carpi radialis brevis) tenosynovitis   . GERD (gastroesophageal reflux disease)    takes Nexium if needed  . Hyperlipidemia   . Lateral epicondylitis of left elbow   . MI, acute, non ST segment elevation ([place]) 06/28/2009   with stenting of the LAD  . Neuromuscular disorder ([place])    peripheral neuropathy  . PVD (peripheral vascular disease) ([place])   . Tobacco abuse     Past Surgical History:  Procedure Laterality Date  . ADENOIDECTOMY  1955  . CARDIAC CATHETERIZATION    . CARDIAC CATHETERIZATION N/A 09/26/2014   Procedure: Left Heart Cath and Coronary Angiography;  Surgeon: Peter M Martinique, MD;  Location: [facility];  Service: Cardiovascular;  Laterality: N/A;  . carpel tunnel release Left 04-1989  . carpel tunnel release  Right 01-1989  . CHOLECYSTECTOMY  2007  . CORONARY STENT PLACEMENT  May 2011  . femoral stents    . IR CV LINE INJECTION  08/18/2017  . IR CV LINE INJECTION  09/01/2017  . IR CV LINE INJECTION  02/02/2018  . LATERAL EPICONDYLE RELEASE Left 02/12/2014  Procedure: LEFT ELBOW DEBRIDEMENT WITH TENDON REPAIR ;  Surgeon: Lorn Junes, MD;  Location: [facility];  Service: Orthopedics;  Laterality: Left;  . LEFT CAI STENT/PTA AND POPLITEAL ARTERY/TIBIAL THROMBECTOMY     . LEFT HEART CATHETERIZATION WITH CORONARY ANGIOGRAM N/A 08/26/2011   Procedure: LEFT HEART CATHETERIZATION WITH CORONARY ANGIOGRAM;  Surgeon: Peter M Martinique, MD;  Location: [facility];  Service: Cardiovascular;  Laterality: N/A;  . PERIPHERAL VASCULAR CATHETERIZATION N/A  01/01/2015   Procedure: Abdominal Aortogram;  Surgeon: Conrad Robinson Mill, MD;  Location: [facility];  Service: Cardiovascular;  Laterality: N/A;  . TARSAL TUNNEL RELEASE Bilateral 08-2007    Social History:  reports that he quit smoking about 1 years ago. His smoking use included cigarettes. He has a 22.00 pack-year smoking history. He has quit using smokeless tobacco. He reports that he does not drink alcohol or use drugs.  Family History:  Family History  Problem Relation Age of Onset  . Lung cancer Mother 40  . Cancer Mother        lung  . Heart failure Father 15  . Heart disease Father      Prior to Admission medications   Medication Sig Start Date End Date Taking? Authorizing Provider  Acalabrutinib (CALQUENCE) 100 MG CAPS Take 100 mg by mouth 2 (two) times daily. 04/10/18  Yes [name], MD  acetaminophen (TYLENOL) 500 MG tablet Take 500 mg by mouth every 6 (six) hours as needed for mild pain, moderate pain or headache.   Yes [provider]  acyclovir (ZOVIRAX) 400 MG tablet Take 1 tablet (400 mg total) by mouth 2 (two) times daily. 02/02/18  Yes Gorsuch, Ni, MD  allopurinol (ZYLOPRIM) 300 MG tablet Take 1 tablet (300 mg total) by mouth daily. 04/10/18  Yes Gorsuch, Ni, MD  ALPRAZolam (XANAX) 0.25 MG tablet Take 0.25 mg by mouth as needed for anxiety.   Yes [provider]  b complex vitamins tablet Take 1 tablet by mouth daily.    Yes [provider]  benzonatate (TESSALON) 100 MG capsule Take 100 mg by mouth as needed for cough.  12/07/17  Yes [provider]  Cholecalciferol (VITAMIN D-1000 MAX ST) 1000 units tablet Take 1,000 Units by mouth daily.    Yes [provider]  fluticasone (FLONASE) 50 MCG/ACT nasal spray Place 2 sprays into the nose daily.  06/07/16  Yes [provider]  furosemide (LASIX) 20 MG tablet Take 1 tablet (20 mg total) by mouth daily as needed. 12/11/17 04/17/18 Yes Burtis Junes, NP    HYDROcodone-acetaminophen (NORCO) 10-325 MG tablet Take 1 tablet by mouth See admin instructions. Every 6 to 8 hours as needed for pain 04/14/15  Yes [provider]  lidocaine-prilocaine (EMLA) cream APPLY TO AFFECTED AREA ONCE Patient taking differently: Apply 1 application topically once.  12/21/17  Yes Nicholas Lose, MD  meclizine (ANTIVERT) 12.5 MG tablet Take 12.5 mg by mouth every 6 (six) hours as needed for dizziness.  12/24/14  Yes [provider]  metFORMIN (GLUCOPHAGE-XR) 500 MG 24 hr tablet Take 500 mg by mouth See admin instructions. Takes one tablet in AM and 2 tablets at night.   Yes [provider]  metoprolol succinate (TOPROL-XL) 25 MG 24 hr tablet TAKE 1 TABLET (25 MG TOTAL) BY MOUTH DAILY. 02/06/18  Yes Burtis Junes, NP  nortriptyline (PAMELOR) 25 MG capsule TAKE 4 CAPSULES BY MOUTH AT BEDTIME. Patient taking differently: Take 100 mg by mouth at bedtime.  12/20/17  Yes Marcial Pacas, MD  pregabalin (LYRICA) 200 MG capsule TAKE 1 CAPSULE BY MOUTH THREE TIMES A DAY AND 2 CAPSULES AT BEDTIME Patient taking differently: Take 200 mg by mouth See admin instructions. TAKE 1 CAPSULE BY MOUTH THREE TIMES A DAY AND 2 CAPSULES AT BEDTIME 11/09/17  Yes Marcial Pacas, MD  PROVENTIL HFA 108 (90 Base) MCG/ACT inhaler TAKE 2 PUFFS BY MOUTH EVERY 6 HOURS AS NEEDED FOR WHEEZE OR SHORTNESS OF BREATH Patient taking differently: Inhale 2 puffs into the lungs every 6 (six) hours as needed for wheezing or shortness of breath.  06/26/17  Yes Gorsuch, Ni, MD  rosuvastatin (CRESTOR) 5 MG tablet TAKE 1 TABLET (5 MG TOTAL) BY MOUTH DAILY. 02/06/18  Yes Burtis Junes, NP  famotidine (PEPCID) 20 MG tablet Take 1 tablet (20 mg total) by mouth 2 (two) times daily. 04/10/18   [name], MD  loperamide (IMODIUM) 2 MG capsule Take 2 capsules (4 mg total) by mouth as needed for diarrhea or loose stools. 04/10/18   [name], MD  nitroGLYCERIN (NITROSTAT) 0.4 MG SL tablet PLACE 1 TABLET UNDER  THE TONGUE EVERY 5 MINUTES X 3 DOSES AS NEEDED FOR CHEST PAIN *MAX 3 DOSES* Patient taking differently: Place 0.4 mg under the tongue every 5 (five) minutes as needed for chest pain.  02/09/18   Burtis Junes, NP  sildenafil (VIAGRA) 100 MG tablet TAKE 1/2-1 TABLET DAILY AS NEEDED FOR ERECTILE DYSFUNCTION (30 MINUTES PRIOR TO SEXUAL INTERCOURSE) Patient taking differently: Take 50-100 mg by mouth as needed for erectile dysfunction.  06/08/17   Burtis Junes, NP    Physical Exam: Vitals:   04/18/18 0130 04/18/18 0230 04/18/18 0305 04/18/18 0345  BP: (!) 89/55 113/60 (!) 100/57 114/61  Pulse: 76 83 78 88  Resp: (!) 23 (!) 23 (!) 23 (!) 23  Temp: 98.9 F (37.2 C)     TempSrc: Oral     SpO2: 100% 97% 99% 100%  Weight:      Height:       General: Not in acute distress HEENT:       Eyes: PERRL, EOMI, no scleral icterus.       ENT: No discharge from the ears and nose, has pharynx injection, no tonsillar enlargement.        Neck: No JVD, no bruit, no mass felt. Heme: No neck lymph node enlargement. Cardiac: S1/S2, RRR, No murmurs, No gallops or rubs. Respiratory: No rales, wheezing, rhonchi or rubs. GI: Soft, nondistended, nontender, no rebound pain, no organomegaly, BS present. GU: No hematuria Ext: Has trace leg edema bilaterally. 2+DP/PT pulse bilaterally. Musculoskeletal: No joint deformities, No joint redness or warmth, no limitation of ROM in spin. Skin: No rashes.  Neuro: Alert, oriented X3, cranial nerves II-XII grossly intact, moves all extremities normally.  Psych: Patient is not psychotic, no suicidal or hemocidal ideation.  Labs on Admission: I have personally reviewed following labs and imaging studies  CBC: Recent Labs  Lab 04/17/18 2139  WBC 17.5*  NEUTROABS 4.9  HGB 13.4  HCT 41.6  MCV 104.8*  PLT 62*   Basic Metabolic Panel: Recent Labs  Lab 04/17/18 2139  NA 135  K 4.3  CL 105  CO2 23  GLUCOSE 125*  BUN 14  CREATININE 1.11  CALCIUM 8.3*    GFR: Estimated Creatinine Clearance: 80.1 mL/min (by C-G formula based on SCr of 1.11 mg/dL). Liver Function Tests: Recent Labs  Lab 04/17/18 2139  AST 17  ALT  10  ALKPHOS 72  BILITOT 1.1  PROT 6.2*  ALBUMIN 3.6   No results for input(s): LIPASE, AMYLASE in the last 168 hours. No results for input(s): AMMONIA in the last 168 hours. Coagulation Profile: No results for input(s): INR, PROTIME in the last 168 hours. Cardiac Enzymes: No results for input(s): CKTOTAL, CKMB, CKMBINDEX, TROPONINI in the last 168 hours. BNP (last 3 results) No results for input(s): PROBNP in the last 8760 hours. HbA1C: No results for input(s): HGBA1C in the last 72 hours. CBG: Recent Labs  Lab 04/18/18 0156  GLUCAP 85   Lipid Profile: No results for input(s): CHOL, HDL, LDLCALC, TRIG, CHOLHDL, LDLDIRECT in the last 72 hours. Thyroid Function Tests: No results for input(s): TSH, T4TOTAL, FREET4, T3FREE, THYROIDAB in the last 72 hours. Anemia Panel: No results for input(s): VITAMINB12, FOLATE, FERRITIN, TIBC, IRON, RETICCTPCT in the last 72 hours. Urine analysis:    Component Value Date/Time   COLORURINE YELLOW 04/18/2018 0100   APPEARANCEUR CLEAR 04/18/2018 0100   LABSPEC 1.019 04/18/2018 0100   PHURINE 5.0 04/18/2018 0100   GLUCOSEU NEGATIVE 04/18/2018 0100   HGBUR NEGATIVE 04/18/2018 0100   BILIRUBINUR NEGATIVE 04/18/2018 0100   KETONESUR 5 (A) 04/18/2018 0100   PROTEINUR NEGATIVE 04/18/2018 0100   UROBILINOGEN 0.2 02/03/2014 1356   NITRITE NEGATIVE 04/18/2018 0100   LEUKOCYTESUR NEGATIVE 04/18/2018 0100   Sepsis Labs: @LABRCNTIP (procalcitonin:4,lacticidven:4) ) Recent Results (from the past 240 hour(s))  Blood Culture (routine x 2)     Status: None (Preliminary result)   Collection Time: 04/17/18  9:47 PM  Result Value Ref Range Status   Specimen Description   Final    BLOOD SITE NOT SPECIFIED Performed at [facility], [place] [address] [place], [place] [address]     Special Requests   Final    BOTTLES DRAWN AEROBIC AND ANAEROBIC Blood Culture adequate volume Performed at [facility] [address], [place], [place] [address]    Culture PENDING  Incomplete   Report Status PENDING  Incomplete     Radiological Exams on Admission: Dg Chest Port 1 View  Result Date: 04/17/2018 CLINICAL DATA:  Fever. Weakness. EXAM: PORTABLE CHEST 1 VIEW COMPARISON:  Chest CT 04/06/2018 FINDINGS: Tip of the right chest port in the mid SVC. Upper normal heart size with unchanged mediastinal contours. Aortic atherosclerosis. No focal airspace disease. No pulmonary edema, pneumothorax, or large pleural effusion. No acute osseous abnormalities are seen. IMPRESSION: No acute chest findings. Electronically Signed   By: Keith Rake M.D.   On: 04/17/2018 21:23     EKG: Independently reviewed.  Sinus rhythm, QTC 416, LAE, low voltage, nonspecific T wave change.  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  Chest x-ray is negative.  Patient meets critical for sepsis with leukocytosis, fever, tachypnea, hypotension.  Blood pressure improved from 86/55 to 97/55 with fluid resuscitation.  -will be admitted to stepdown has inpatient -Tamiflu started (patient received 1 dose of Rocephin and azithromycin in the ED. -will get Procalcitonin and trend lactic acid levels per sepsis protocol. 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): just started Calquence yesterday. -Follow-up with Dr. Addison Bailey -hold Calquence now.  CAD (coronary artery disease): s/p of stent. no CP.  -continue crestor  Diabetes mellitus without complication ([place]):  Last A1c 6.2on 04/13/18, well controled. Patient is taking metformin at home -SSI  Anxiety: -As needed Xanax  thrombocytopenia ([place]); platelets 62.  Likely due to chemotherapy.  No bleeding tendency. -Follow-up with CBC  COPD mixed type ([place]): stable. -Continue bronchodilators     Inpatient status:  # Patient requires inpatient status due to high intensity of service, high risk for further deterioration and high frequency of surveillance required.  I certify that at the point of admission it is my clinical judgment that the patient will require inpatient hospital care spanning beyond 2 midnights from the point of admission.  . 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 Now patient has presenting with Flu A, sepsis and hypotension . The initial radiographic and laboratory data are worrisome because of positive flu A PCR, leukocytosis, thrombocytopenia, . Current medical needs: please see my assessment and plan . Predictability of an adverse outcome (risk): Patient has multiple comorbidities, now presents with sepsis due to influenza A.  Patient is septic with hypotension.  Patient's presentation is highly complicated, and is at high risk of deteriorating.  Patient will need to be treated in hospital for at least 2 days.    DVT ppx: SCD Code Status: Full code Family Communication: None at bed  side.    Disposition Plan:  Anticipate discharge back to previous home environment Consults called: None Admission status:  SDU/inpation       Date of Service 04/18/2018    [facility]   If 7PM-7AM, please contact night-coverage [email or web address] Password [email or web address] 04/18/2018, 4:16 AM

## 2018-04-17 NOTE — ED Triage Notes (Signed)
Pt reports from home with generalized weakness, fever, cough. Hx of CLL since 2014. Started "chemo pill" yesterday. Wife had similar symptoms last weak. Denies N/V/D

## 2018-04-18 DIAGNOSIS — I2510 Atherosclerotic heart disease of native coronary artery without angina pectoris: Secondary | ICD-10-CM | POA: Diagnosis present

## 2018-04-18 DIAGNOSIS — C8308 Small cell B-cell lymphoma, lymph nodes of multiple sites: Secondary | ICD-10-CM | POA: Diagnosis present

## 2018-04-18 DIAGNOSIS — A4189 Other specified sepsis: Secondary | ICD-10-CM | POA: Diagnosis present

## 2018-04-18 DIAGNOSIS — Z7951 Long term (current) use of inhaled steroids: Secondary | ICD-10-CM | POA: Diagnosis not present

## 2018-04-18 DIAGNOSIS — E1151 Type 2 diabetes mellitus with diabetic peripheral angiopathy without gangrene: Secondary | ICD-10-CM | POA: Diagnosis present

## 2018-04-18 DIAGNOSIS — J449 Chronic obstructive pulmonary disease, unspecified: Secondary | ICD-10-CM | POA: Diagnosis present

## 2018-04-18 DIAGNOSIS — I5032 Chronic diastolic (congestive) heart failure: Secondary | ICD-10-CM | POA: Diagnosis present

## 2018-04-18 DIAGNOSIS — Z8249 Family history of ischemic heart disease and other diseases of the circulatory system: Secondary | ICD-10-CM | POA: Diagnosis not present

## 2018-04-18 DIAGNOSIS — K219 Gastro-esophageal reflux disease without esophagitis: Secondary | ICD-10-CM | POA: Diagnosis present

## 2018-04-18 DIAGNOSIS — Z7984 Long term (current) use of oral hypoglycemic drugs: Secondary | ICD-10-CM | POA: Diagnosis not present

## 2018-04-18 DIAGNOSIS — T451X5A Adverse effect of antineoplastic and immunosuppressive drugs, initial encounter: Secondary | ICD-10-CM | POA: Diagnosis present

## 2018-04-18 DIAGNOSIS — G8929 Other chronic pain: Secondary | ICD-10-CM | POA: Diagnosis present

## 2018-04-18 DIAGNOSIS — F419 Anxiety disorder, unspecified: Secondary | ICD-10-CM | POA: Diagnosis present

## 2018-04-18 DIAGNOSIS — M549 Dorsalgia, unspecified: Secondary | ICD-10-CM | POA: Diagnosis present

## 2018-04-18 DIAGNOSIS — R509 Fever, unspecified: Secondary | ICD-10-CM | POA: Diagnosis present

## 2018-04-18 DIAGNOSIS — D6959 Other secondary thrombocytopenia: Secondary | ICD-10-CM | POA: Diagnosis present

## 2018-04-18 DIAGNOSIS — Z87891 Personal history of nicotine dependence: Secondary | ICD-10-CM | POA: Diagnosis not present

## 2018-04-18 DIAGNOSIS — E785 Hyperlipidemia, unspecified: Secondary | ICD-10-CM | POA: Diagnosis present

## 2018-04-18 DIAGNOSIS — J9601 Acute respiratory failure with hypoxia: Secondary | ICD-10-CM | POA: Diagnosis not present

## 2018-04-18 DIAGNOSIS — Z885 Allergy status to narcotic agent status: Secondary | ICD-10-CM | POA: Diagnosis not present

## 2018-04-18 DIAGNOSIS — I252 Old myocardial infarction: Secondary | ICD-10-CM | POA: Diagnosis not present

## 2018-04-18 DIAGNOSIS — J101 Influenza due to other identified influenza virus with other respiratory manifestations: Secondary | ICD-10-CM | POA: Diagnosis not present

## 2018-04-18 DIAGNOSIS — Z801 Family history of malignant neoplasm of trachea, bronchus and lung: Secondary | ICD-10-CM | POA: Diagnosis not present

## 2018-04-18 LAB — URINALYSIS, ROUTINE W REFLEX MICROSCOPIC
Bilirubin Urine: NEGATIVE
Glucose, UA: NEGATIVE mg/dL
Hgb urine dipstick: NEGATIVE
Ketones, ur: 5 mg/dL — AB
Leukocytes,Ua: NEGATIVE
Nitrite: NEGATIVE
Protein, ur: NEGATIVE mg/dL
SPECIFIC GRAVITY, URINE: 1.019 (ref 1.005–1.030)
pH: 5 (ref 5.0–8.0)

## 2018-04-18 LAB — CBC
HCT: 35.1 % — ABNORMAL LOW (ref 39.0–52.0)
Hemoglobin: 11.4 g/dL — ABNORMAL LOW (ref 13.0–17.0)
MCH: 34 pg (ref 26.0–34.0)
MCHC: 32.5 g/dL (ref 30.0–36.0)
MCV: 104.8 fL — ABNORMAL HIGH (ref 80.0–100.0)
PLATELETS: 44 10*3/uL — AB (ref 150–400)
RBC: 3.35 MIL/uL — ABNORMAL LOW (ref 4.22–5.81)
RDW: 18.1 % — ABNORMAL HIGH (ref 11.5–15.5)
WBC: 10.2 10*3/uL (ref 4.0–10.5)
nRBC: 0.2 % (ref 0.0–0.2)

## 2018-04-18 LAB — BASIC METABOLIC PANEL
ANION GAP: 7 (ref 5–15)
BUN: 13 mg/dL (ref 8–23)
CO2: 21 mmol/L — ABNORMAL LOW (ref 22–32)
Calcium: 7.3 mg/dL — ABNORMAL LOW (ref 8.9–10.3)
Chloride: 108 mmol/L (ref 98–111)
Creatinine, Ser: 1.08 mg/dL (ref 0.61–1.24)
GFR calc Af Amer: >60 mL/min (ref >60)
GFR calc non Af Amer: >60 mL/min (ref >60)
Glucose, Bld: 105 mg/dL — ABNORMAL HIGH (ref 70–99)
Potassium: 3.9 mmol/L (ref 3.5–5.1)
Sodium: 136 mmol/L (ref 135–145)

## 2018-04-18 LAB — GLUCOSE, CAPILLARY
Glucose-Capillary: 94 mg/dL (ref 70–99)
Glucose-Capillary: 114 mg/dL — ABNORMAL HIGH (ref 70–99)
Glucose-Capillary: 83 mg/dL (ref 70–99)

## 2018-04-18 LAB — BRAIN NATRIURETIC PEPTIDE: B Natriuretic Peptide: 54 pg/mL (ref 0.0–100.0)

## 2018-04-18 LAB — CBG MONITORING, ED
GLUCOSE-CAPILLARY: 99 mg/dL (ref 70–99)
Glucose-Capillary: 85 mg/dL (ref 70–99)

## 2018-04-18 LAB — CORTISOL-AM, BLOOD: Cortisol - AM: 5.5 ug/dL — ABNORMAL LOW (ref 6.7–22.6)

## 2018-04-18 LAB — MRSA PCR SCREENING: MRSA by PCR: NEGATIVE

## 2018-04-18 LAB — PROCALCITONIN: Procalcitonin: 0.46 ng/mL

## 2018-04-18 LAB — LACTIC ACID, PLASMA: Lactic Acid, Venous: 0.7 mmol/L (ref 0.5–1.9)

## 2018-04-18 MED ORDER — ACYCLOVIR 400 MG PO TABS
400.0000 mg | ORAL_TABLET | Freq: Two times a day (BID) | ORAL | Status: DC
  Administered 2018-04-18 – 2018-04-21 (×7): 400 mg via ORAL
  Filled 2018-04-18 (×7): qty 1

## 2018-04-18 MED ORDER — MORPHINE SULFATE (PF) 2 MG/ML IV SOLN
2.0000 mg | Freq: Once | INTRAVENOUS | Status: AC
  Administered 2018-04-18: 2 mg via INTRAVENOUS
  Filled 2018-04-18 (×2): qty 1

## 2018-04-18 MED ORDER — SODIUM CHLORIDE 0.9 % IV BOLUS
1000.0000 mL | Freq: Once | INTRAVENOUS | Status: AC
  Administered 2018-04-18: 1000 mL via INTRAVENOUS

## 2018-04-18 MED ORDER — B COMPLEX PO TABS: 1.0000 | ORAL_TABLET | Freq: Every day | ORAL | Status: DC

## 2018-04-18 MED ORDER — DM-GUAIFENESIN ER 30-600 MG PO TB12
1.0000 | ORAL_TABLET | Freq: Two times a day (BID) | ORAL | Status: DC | PRN
  Administered 2018-04-18: 1 via ORAL
  Filled 2018-04-18: qty 1

## 2018-04-18 MED ORDER — FLUTICASONE PROPIONATE 50 MCG/ACT NA SUSP
2.0000 | Freq: Every day | NASAL | Status: DC
  Administered 2018-04-18 – 2018-04-19 (×2): 2 via NASAL
  Filled 2018-04-18: qty 16

## 2018-04-18 MED ORDER — ONDANSETRON HCL 4 MG/2ML IJ SOLN: 4.0000 mg | Freq: Four times a day (QID) | INTRAMUSCULAR | Status: DC | PRN

## 2018-04-18 MED ORDER — PREGABALIN 50 MG PO CAPS
50.0000 mg | ORAL_CAPSULE | Freq: Three times a day (TID) | ORAL | Status: DC
  Administered 2018-04-18 – 2018-04-21 (×9): 50 mg via ORAL
  Filled 2018-04-18 (×9): qty 1

## 2018-04-18 MED ORDER — VITAMIN D 25 MCG (1000 UNIT) PO TABS
1000.0000 [IU] | ORAL_TABLET | Freq: Every day | ORAL | Status: DC
  Administered 2018-04-18 – 2018-04-21 (×4): 1000 [IU] via ORAL
  Filled 2018-04-18 (×4): qty 1

## 2018-04-18 MED ORDER — SODIUM CHLORIDE 0.9 % IV SOLN
INTRAVENOUS | Status: DC
  Administered 2018-04-18 (×2): via INTRAVENOUS

## 2018-04-18 MED ORDER — INSULIN ASPART 100 UNIT/ML ~~LOC~~ SOLN: 0.0000 [IU] | Freq: Three times a day (TID) | SUBCUTANEOUS | Status: DC

## 2018-04-18 MED ORDER — NORTRIPTYLINE HCL 25 MG PO CAPS
100.0000 mg | ORAL_CAPSULE | Freq: Every day | ORAL | Status: DC
  Administered 2018-04-18 – 2018-04-20 (×3): 100 mg via ORAL
  Filled 2018-04-18 (×3): qty 4

## 2018-04-18 MED ORDER — HYDRALAZINE HCL 20 MG/ML IJ SOLN: 5.0000 mg | INTRAMUSCULAR | Status: DC | PRN

## 2018-04-18 MED ORDER — SODIUM CHLORIDE 0.9 % IV BOLUS
500.0000 mL | Freq: Once | INTRAVENOUS | Status: AC
  Administered 2018-04-18: 500 mL via INTRAVENOUS

## 2018-04-18 MED ORDER — ACETAMINOPHEN 500 MG PO TABS
500.0000 mg | ORAL_TABLET | Freq: Four times a day (QID) | ORAL | Status: DC | PRN
  Administered 2018-04-18 (×2): 500 mg via ORAL
  Filled 2018-04-18 (×2): qty 1

## 2018-04-18 MED ORDER — LIDOCAINE 5 % EX PTCH
2.0000 | MEDICATED_PATCH | CUTANEOUS | Status: DC
  Filled 2018-04-18 (×3): qty 2

## 2018-04-18 MED ORDER — OSELTAMIVIR PHOSPHATE 75 MG PO CAPS
75.0000 mg | ORAL_CAPSULE | Freq: Two times a day (BID) | ORAL | Status: DC
  Administered 2018-04-18 – 2018-04-21 (×6): 75 mg via ORAL
  Filled 2018-04-18 (×6): qty 1

## 2018-04-18 MED ORDER — OSELTAMIVIR PHOSPHATE 75 MG PO CAPS
75.0000 mg | ORAL_CAPSULE | Freq: Two times a day (BID) | ORAL | Status: DC
  Administered 2018-04-18: 75 mg via ORAL
  Filled 2018-04-18: qty 1

## 2018-04-18 MED ORDER — HYDROCODONE-ACETAMINOPHEN 10-325 MG PO TABS
1.0000 | ORAL_TABLET | Freq: Four times a day (QID) | ORAL | Status: DC | PRN
  Administered 2018-04-18 (×2): 1 via ORAL
  Filled 2018-04-18 (×2): qty 1

## 2018-04-18 MED ORDER — ONDANSETRON HCL 4 MG PO TABS: 4.0000 mg | ORAL_TABLET | Freq: Four times a day (QID) | ORAL | Status: DC | PRN

## 2018-04-18 MED ORDER — ROSUVASTATIN CALCIUM 5 MG PO TABS
5.0000 mg | ORAL_TABLET | Freq: Every day | ORAL | Status: DC
  Administered 2018-04-18 – 2018-04-21 (×4): 5 mg via ORAL
  Filled 2018-04-18 (×4): qty 1

## 2018-04-18 MED ORDER — HYDROCORTISONE NA SUCCINATE PF 100 MG IJ SOLR
100.0000 mg | Freq: Once | INTRAMUSCULAR | Status: AC
  Administered 2018-04-18: 100 mg via INTRAVENOUS
  Filled 2018-04-18: qty 2

## 2018-04-18 MED ORDER — FAMOTIDINE 20 MG PO TABS
20.0000 mg | ORAL_TABLET | Freq: Two times a day (BID) | ORAL | Status: DC
  Administered 2018-04-18 – 2018-04-21 (×7): 20 mg via ORAL
  Filled 2018-04-18 (×7): qty 1

## 2018-04-18 MED ORDER — PREGABALIN 100 MG PO CAPS
100.0000 mg | ORAL_CAPSULE | Freq: Every day | ORAL | Status: DC
  Administered 2018-04-18 – 2018-04-20 (×3): 100 mg via ORAL
  Filled 2018-04-18 (×3): qty 1

## 2018-04-18 MED ORDER — B COMPLEX-C PO TABS
1.0000 | ORAL_TABLET | Freq: Every day | ORAL | Status: DC
  Administered 2018-04-18 – 2018-04-21 (×4): 1 via ORAL
  Filled 2018-04-18 (×4): qty 1

## 2018-04-18 MED ORDER — ALBUTEROL SULFATE (2.5 MG/3ML) 0.083% IN NEBU: 2.5000 mg | INHALATION_SOLUTION | RESPIRATORY_TRACT | Status: DC | PRN

## 2018-04-18 MED ORDER — ORAL CARE MOUTH RINSE
15.0000 mL | Freq: Two times a day (BID) | OROMUCOSAL | Status: DC
  Administered 2018-04-19 – 2018-04-20 (×4): 15 mL via OROMUCOSAL

## 2018-04-18 MED ORDER — INSULIN ASPART 100 UNIT/ML ~~LOC~~ SOLN: 0.0000 [IU] | Freq: Every day | SUBCUTANEOUS | Status: DC

## 2018-04-18 MED ORDER — ALLOPURINOL 300 MG PO TABS
300.0000 mg | ORAL_TABLET | Freq: Every day | ORAL | Status: DC
  Administered 2018-04-18 – 2018-04-21 (×4): 300 mg via ORAL
  Filled 2018-04-18: qty 1
  Filled 2018-04-18 (×2): qty 3
  Filled 2018-04-18: qty 1

## 2018-04-18 MED ORDER — ALPRAZOLAM 0.25 MG PO TABS
0.2500 mg | ORAL_TABLET | ORAL | Status: DC | PRN
  Administered 2018-04-18: 0.25 mg via ORAL
  Filled 2018-04-18: qty 1

## 2018-04-18 MED ORDER — LORAZEPAM 2 MG/ML IJ SOLN
1.0000 mg | Freq: Once | INTRAMUSCULAR | Status: AC
  Administered 2018-04-18: 1 mg via INTRAVENOUS
  Filled 2018-04-18: qty 1

## 2018-04-18 NOTE — Progress Notes (Signed)
PROGRESS NOTE  Ryan Copeland UTM:[id number] DOB: April 21, 1950 DOA: 04/17/2018 PCP: [name]., PA-C  HPI/Recap of past 24 hours: Ryan Copeland is a 68 y.o. 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 Influenza A PCR positive. TRH asked to admit for treatment.  04/18/18: Patient seen and examined at his bedside in the emergency department.  Reports dyspnea with minimal movement and nonproductive cough.  O2 saturation currently 92% on 2 L by nasal cannula.  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)   CAD (coronary artery disease)   Diabetes mellitus without complication (HCC)   Anxiety   Thrombocytopenia (HCC)   COPD mixed type (HCC)   Sepsis (HCC)   Chronic diastolic CHF (congestive heart failure) (HCC)   Sepsis secondary to influenza A infection Presented with leukocytosis, fever, tachypnea, hypotension Influenza A PCR positive Responded well to IV fluids Continue to maintain O2 saturation greater than 90% Continue nebs and antitussive  Hypotension secondary to sepsis Responded well to IV fluid Reduce rate from 125 cc/h of normal saline to 75 cc/h Continue to monitor vital signs  Chronic diastolic CHF Appears euvolemic Continue to hold Lasix for now continue to hold metoprolol due to hypotension Strict I's and O's and daily weight   GERD: -Pravastatin  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 -Follow-up with Dr. Addison Bailey -hold Calquence now.  CAD (coronary artery disease): s/p of stent. no CP.  -continue crestor  Diabetes mellitus without complication ([place]):  Last A1c 6.2on  04/13/18, well controled. Patient is taking metformin at home -SSI  Anxiety: -As needed Xanax  Chronic thrombocytopenia ([place]); platelets 62.  Likely due to chemotherapy.  No bleeding tendency. -Follow-up with CBC  COPD mixed type ([place]): stable. -Continue bronchodilators   Risk: High risk for decompensation due to newly diagnosed influenza A in the setting of immunosuppression, multiple comorbidities and advanced age.  Patient will require at least 2 midnights for further evaluation and treatment of present condition.    DVT ppx: SCD due to thrombocytopenia 62K from 04/17/2018 Code Status: Full code Family Communication: None at bed side.    Disposition Plan:   DC possibly to home in 1 to 2 days Consults called: None  Objective: Vitals:   04/18/18 0930 04/18/18 1000 04/18/18 1030 04/18/18 1049  BP: 114/66 121/63 124/67   Pulse: 96 95 96   Resp: 14 20 (!) 26   Temp:    (!) 101.1 F (38.4 C)  TempSrc:    Oral  SpO2: 94% 98% 92%   Weight:      Height:       No intake or output data in the 24 hours ending 04/18/18 1107 Filed Weights   04/17/18 2056  Weight: 99.8 kg    Exam:  . General: 68 y.o. year-old male well developed well nourished in no acute distress.  Alert and oriented x3. . Cardiovascular: Regular rate and rhythm with no rubs or gallops.  No thyromegaly or JVD noted.   Marland Kitchen Respiratory: Clear to auscultation with no wheezes or rales. Good inspiratory effort. . Abdomen: Soft nontender mildly distended with normal bowel sounds x4 quadrants. . Musculoskeletal: Trace lower extremity edema. 2/4 pulses in  all 4 extremities. Marland Kitchen Psychiatry: Mood is appropriate for condition and setting   Data Reviewed: CBC: Recent Labs  Lab 04/17/18 2139  WBC 17.5*  NEUTROABS 4.9  HGB 13.4  HCT 41.6  MCV 104.8*  PLT 62*   Basic Metabolic Panel: Recent Labs  Lab 04/17/18 2139  NA 135  K 4.3  CL 105  CO2 23  GLUCOSE 125*  BUN 14  CREATININE 1.11  CALCIUM 8.3*    GFR: Estimated Creatinine Clearance: 80.1 mL/min (by C-G formula based on SCr of 1.11 mg/dL). Liver Function Tests: Recent Labs  Lab 04/17/18 2139  AST 17  ALT 10  ALKPHOS 72  BILITOT 1.1  PROT 6.2*  ALBUMIN 3.6   No results for input(s): LIPASE, AMYLASE in the last 168 hours. No results for input(s): AMMONIA in the last 168 hours. Coagulation Profile: No results for input(s): INR, PROTIME in the last 168 hours. Cardiac Enzymes: No results for input(s): CKTOTAL, CKMB, CKMBINDEX, TROPONINI in the last 168 hours. BNP (last 3 results) No results for input(s): PROBNP in the last 8760 hours. HbA1C: No results for input(s): HGBA1C in the last 72 hours. CBG: Recent Labs  Lab 04/18/18 0156 04/18/18 0752  GLUCAP 85 99   Lipid Profile: No results for input(s): CHOL, HDL, LDLCALC, TRIG, CHOLHDL, LDLDIRECT in the last 72 hours. Thyroid Function Tests: No results for input(s): TSH, T4TOTAL, FREET4, T3FREE, THYROIDAB in the last 72 hours. Anemia Panel: No results for input(s): VITAMINB12, FOLATE, FERRITIN, TIBC, IRON, RETICCTPCT in the last 72 hours. Urine analysis:    Component Value Date/Time   COLORURINE YELLOW 04/18/2018 0100   APPEARANCEUR CLEAR 04/18/2018 0100   LABSPEC 1.019 04/18/2018 0100   PHURINE 5.0 04/18/2018 0100   GLUCOSEU NEGATIVE 04/18/2018 0100   HGBUR NEGATIVE 04/18/2018 0100   BILIRUBINUR NEGATIVE 04/18/2018 0100   KETONESUR 5 (A) 04/18/2018 0100   PROTEINUR NEGATIVE 04/18/2018 0100   UROBILINOGEN 0.2 02/03/2014 1356   NITRITE NEGATIVE 04/18/2018 0100   LEUKOCYTESUR NEGATIVE 04/18/2018 0100   Sepsis Labs: @LABRCNTIP (procalcitonin:4,lacticidven:4)  ) Recent Results (from the past 240 hour(s))  Blood Culture (routine x 2)     Status: None (Preliminary result)   Collection Time: 04/17/18  9:47 PM  Result Value Ref Range Status   Specimen Description   Final    BLOOD SITE NOT SPECIFIED Performed at [facility], [place] [address]  [place], [place] [address]    Special Requests   Final    BOTTLES DRAWN AEROBIC AND ANAEROBIC Blood Culture adequate volume Performed at [facility] [address] [place], [place] [address]    Culture PENDING  Incomplete   Report Status PENDING  Incomplete      Studies: Dg Chest Port 1 View  Result Date: 04/17/2018 CLINICAL DATA:  Fever. Weakness. EXAM: PORTABLE CHEST 1 VIEW COMPARISON:  Chest CT 04/06/2018 FINDINGS: Tip of the right chest port in the mid SVC. Upper normal heart size with unchanged mediastinal contours. Aortic atherosclerosis. No focal airspace disease. No pulmonary edema, pneumothorax, or large pleural effusion. No acute osseous abnormalities are seen. IMPRESSION: No acute chest findings. Electronically Signed   By: Keith Rake M.D.   On: 04/17/2018 21:23    Scheduled Meds: . acyclovir  400 mg Oral BID  . allopurinol  300 mg Oral Daily  . B-complex with vitamin C  1 tablet Oral Daily  . cholecalciferol  1,000 Units Oral Daily  . famotidine  20 mg Oral BID  . fluticasone  2 spray Each Nare Daily  . hydrocortisone sod succinate (SOLU-CORTEF) inj  100 mg Intravenous Once  . insulin aspart  0-5 Units Subcutaneous QHS  . insulin aspart  0-9 Units Subcutaneous TID WC  . nortriptyline  100 mg Oral QHS  . oseltamivir  75 mg Oral BID  . pregabalin  100 mg Oral QHS  . pregabalin  50 mg Oral TID WC  . rosuvastatin  5 mg Oral Daily    Continuous Infusions: . sodium chloride 125 mL/hr at 04/18/18 0318     LOS: 0 days     Kayleen Memos, MD [facility] Pager [phone]  If 7PM-7AM, please contact night-coverage [email or web address] Password [email or web address] 04/18/2018, 11:07 AM

## 2018-04-18 NOTE — ED Notes (Signed)
Pt resting in bed, Temp rechecked. Oxygen reapplied. Pt in no acute distress at this time

## 2018-04-18 NOTE — ED Notes (Signed)
Pt called out for help using the bathroom. Got pt readjusted in bed and brought him fresh water. No acute distress at this time

## 2018-04-18 NOTE — ED Notes (Addendum)
Lab called in regard to morning lab results. Per Lab: BNP was ran but not CBC and BMP was not ran, This RN will recollect at this time and send to lab

## 2018-04-18 NOTE — Progress Notes (Signed)
Pt refuses to take the lidocaine patch. Pt states" it does not help" pt is very argumentative and son arrives at bedside. Pt states "I take IV morphine for my back pain while im in the hospital". Advised pt and son RN would page MD to make aware of pt request. Will continue to monitor.

## 2018-04-18 NOTE — ED Notes (Signed)
BMP and CBC collected at this time from pts port

## 2018-04-18 NOTE — ED Notes (Addendum)
Pt resting in bed, sleeping. Labs drawn and fluids started. No acute distress.

## 2018-04-18 NOTE — ED Notes (Signed)
Pharmacy notified need to verify medications

## 2018-04-18 NOTE — ED Notes (Signed)
ED TO INPATIENT HANDOFF REPORT  ED Nurse Name and Phone #: Burak Zerbe T RN  S Name/Age/Gender Ryan Copeland 68 y.o. male Room/Bed: WA12/WA12  Code Status   Code Status: Full Code  Home/SNF/Other Home Patient oriented to: self, place, time and situation Is this baseline? Yes   Triage Complete: Triage complete  Chief Complaint CA Pt; Generalized Weakness; Flu Like Symptoms  Triage Note Pt reports from home with generalized weakness, fever, cough. Hx of CLL since 2014. Started "chemo pill" yesterday. Wife had similar symptoms last weak. Denies N/V/D   Allergies Allergies  Allergen Reactions  . Codeine Hives    Pt states he can take a few, more reaction with extended doses.    Level of Care/Admitting Diagnosis ED Disposition    ED Disposition Condition [facility] Area: [place] [[id number]]  Level of Care: Stepdown [14]  Admit to SDU based on following criteria: Hemodynamic compromise or significant risk of instability:  Patient requiring short term acute titration and management of vasoactive drips, and invasive monitoring (i.e., CVP and Arterial line).  Diagnosis: Influenza A [704888]  Admitting Physician: Ivor Costa [[id number]]  Attending Physician: Ivor Costa [phone]  Estimated length of stay: past midnight tomorrow  Certification:: I certify this patient will need inpatient services for at least 2 midnights  PT Class (Do Not Modify): Inpatient [101]  PT Acc Code (Do Not Modify): Private [1]       B Medical/Surgery History Past Medical History:  Diagnosis Date  . Anxiety 01/30/2014  . Back injury    lower disc  . CAD (coronary artery disease)   . CLL (chronic lymphocytic leukemia) ([place]) 03/18/2011  . Diabetes mellitus ([place])    Type 2   . ECRB (extensor carpi radialis brevis) tenosynovitis   . GERD (gastroesophageal reflux disease)    takes Nexium if needed  . Hyperlipidemia   . Lateral epicondylitis of left elbow   . MI, acute,  non ST segment elevation ([place]) 06/28/2009   with stenting of the LAD  . Neuromuscular disorder ([place])    peripheral neuropathy  . PVD (peripheral vascular disease) ([place])   . Tobacco abuse    Past Surgical History:  Procedure Laterality Date  . ADENOIDECTOMY  1955  . CARDIAC CATHETERIZATION    . CARDIAC CATHETERIZATION N/A 09/26/2014   Procedure: Left Heart Cath and Coronary Angiography;  Surgeon: Peter M Martinique, MD;  Location: [facility];  Service: Cardiovascular;  Laterality: N/A;  . carpel tunnel release Left 04-1989  . carpel tunnel release  Right 01-1989  . CHOLECYSTECTOMY  2007  . CORONARY STENT PLACEMENT  May 2011  . femoral stents    . IR CV LINE INJECTION  08/18/2017  . IR CV LINE INJECTION  09/01/2017  . IR CV LINE INJECTION  02/02/2018  . LATERAL EPICONDYLE RELEASE Left 02/12/2014   Procedure: LEFT ELBOW DEBRIDEMENT WITH TENDON REPAIR ;  Surgeon: Lorn Junes, MD;  Location: [facility];  Service: Orthopedics;  Laterality: Left;  . LEFT CAI STENT/PTA AND POPLITEAL ARTERY/TIBIAL THROMBECTOMY     . LEFT HEART CATHETERIZATION WITH CORONARY ANGIOGRAM N/A 08/26/2011   Procedure: LEFT HEART CATHETERIZATION WITH CORONARY ANGIOGRAM;  Surgeon: Peter M Martinique, MD;  Location: [facility] LAB;  Service: Cardiovascular;  Laterality: N/A;  . PERIPHERAL VASCULAR CATHETERIZATION N/A 01/01/2015   Procedure: Abdominal Aortogram;  Surgeon: Conrad Spurgeon, MD;  Location: [facility];  Service: Cardiovascular;  Laterality: N/A;  . TARSAL  TUNNEL RELEASE Bilateral 08-2007     A IV Location/Drains/Wounds Patient Lines/Drains/Airways Status   Active Line/Drains/Airways    Name:   Placement date:   Placement time:   Site:   Days:   Implanted Port 06/01/12 Right Chest   06/01/12    1435    Chest   2147   Implanted Port 04/17/18 Right Chest   04/17/18    2141    Chest   1          Intake/Output Last 24 hours No intake or output data in the 24 hours ending 04/18/18  1201  Labs/Imaging Results for orders placed or performed during the hospital encounter of 04/17/18 (from the past 48 hour(s))  Influenza panel by PCR (type A & B)     Status: Abnormal   Collection Time: 04/17/18  9:12 PM  Result Value Ref Range   Influenza A By PCR POSITIVE (A) NEGATIVE   Influenza B By PCR NEGATIVE NEGATIVE    Comment: (NOTE) The Xpert Xpress Flu assay is intended as an aid in the diagnosis of  influenza and should not be used as a sole basis for treatment.  This  assay is FDA approved for nasopharyngeal swab specimens only. Nasal  washings and aspirates are unacceptable for Xpert Xpress Flu testing. Performed at [facility], [place] [address], [place], [place] [address]   Blood Culture (routine x 2)     Status: None (Preliminary result)   Collection Time: 04/17/18  9:20 PM  Result Value Ref Range   Specimen Description      BLOOD LEFT WRIST Performed at [facility] [address] [place], [place] [address]    Special Requests      BOTTLES DRAWN AEROBIC AND ANAEROBIC Blood Culture adequate volume Performed at [facility] [address] [place], [place] [address]    Culture      NO GROWTH < 12 HOURS Performed at [facility] [address] [place], [place] [address]    Report Status PENDING   Comprehensive metabolic panel     Status: Abnormal   Collection Time: 04/17/18  9:39 PM  Result Value Ref Range   Sodium 135 135 - 145 mmol/L   Potassium 4.3 3.5 - 5.1 mmol/L   Chloride 105 98 - 111 mmol/L   CO2 23 22 - 32 mmol/L   Glucose, Bld 125 (H) 70 - 99 mg/dL   BUN 14 8 - 23 mg/dL   Creatinine, Ser 1.11 0.61 - 1.24 mg/dL   Calcium 8.3 (L) 8.9 - 10.3 mg/dL   Total Protein 6.2 (L) 6.5 - 8.1 g/dL   Albumin 3.6 3.5 - 5.0 g/dL   AST 17 15 - 41 U/L   ALT 10 0 - 44 U/L   Alkaline Phosphatase 72 38 - 126 U/L   Total Bilirubin 1.1 0.3 - 1.2 mg/dL   GFR calc non Af Amer >60 >60 mL/min   GFR calc  Af Amer >60 >60 mL/min   Anion gap 7 5 - 15    Comment: Performed at [facility], [place] [address] [place], [place] [address]  CBC WITH DIFFERENTIAL     Status: Abnormal   Collection Time: 04/17/18  9:39 PM  Result Value Ref Range   WBC 17.5 (H) 4.0 - 10.5 K/uL   RBC 3.97 (L) 4.22 - 5.81 MIL/uL   Hemoglobin 13.4 13.0 - 17.0 g/dL   HCT 41.6 39.0 - 52.0 %  MCV 104.8 (H) 80.0 - 100.0 fL   MCH 33.8 26.0 - 34.0 pg   MCHC 32.2 30.0 - 36.0 g/dL   RDW 18.3 (H) 11.5 - 15.5 %   Platelets 62 (L) 150 - 400 K/uL    Comment: REPEATED TO VERIFY PLATELET COUNT CONFIRMED BY SMEAR Immature Platelet Fraction may be clinically indicated, consider ordering this additional test [id number]    nRBC 0.1 0.0 - 0.2 %   Neutrophils Relative % 28 %   Neutro Abs 4.9 1.7 - 7.7 K/uL   Lymphocytes Relative 69 %   Lymphs Abs 12.1 (H) 0.7 - 4.0 K/uL   Monocytes Relative 3 %   Monocytes Absolute 0.5 0.1 - 1.0 K/uL   Eosinophils Relative 0 %   Eosinophils Absolute 0.0 0.0 - 0.5 K/uL   Basophils Relative 0 %   Basophils Absolute 0.0 0.0 - 0.1 K/uL   Abs Immature Granulocytes 0.00 0.00 - 0.07 K/uL   Smudge Cells PRESENT    Tear Drop Cells PRESENT     Comment: Performed at [facility], [place] [address], [place], [place] [address]  Blood Culture (routine x 2)     Status: None (Preliminary result)   Collection Time: 04/17/18  9:47 PM  Result Value Ref Range   Specimen Description      BLOOD SITE NOT SPECIFIED Performed at [facility], [place] [address], [place], [place] [address]    Special Requests      BOTTLES DRAWN AEROBIC AND ANAEROBIC Blood Culture adequate volume Performed at [facility] [address] [place], [place] [address]    Culture      NO GROWTH < 12 HOURS Performed at [facility] [address], [place], [place] [address]    Report Status PENDING   Lactic acid, plasma     Status: None   Collection Time: 04/18/18 12:26  AM  Result Value Ref Range   Lactic Acid, Venous 0.7 0.5 - 1.9 mmol/L    Comment: Performed at [facility], [place] [address] [place], [place] [address]  Urinalysis, Routine w reflex microscopic (not at [facility])     Status: Abnormal   Collection Time: 04/18/18  1:00 AM  Result Value Ref Range   Color, Urine YELLOW YELLOW   APPearance CLEAR CLEAR   Specific Gravity, Urine 1.019 1.005 - 1.030   pH 5.0 5.0 - 8.0   Glucose, UA NEGATIVE NEGATIVE mg/dL   Hgb urine dipstick NEGATIVE NEGATIVE   Bilirubin Urine NEGATIVE NEGATIVE   Ketones, ur 5 (A) NEGATIVE mg/dL   Protein, ur NEGATIVE NEGATIVE mg/dL   Nitrite NEGATIVE NEGATIVE   Leukocytes,Ua NEGATIVE NEGATIVE    Comment: Performed at [facility] [address], [place], [place] [address]  CBG monitoring, ED     Status: None   Collection Time: 04/18/18  1:56 AM  Result Value Ref Range   Glucose-Capillary 85 70 - 99 mg/dL   Comment 1 Notify RN    Comment 2 Document in Chart   Brain natriuretic peptide     Status: None   Collection Time: 04/18/18  3:15 AM  Result Value Ref Range   B Natriuretic Peptide 54.0 0.0 - 100.0 pg/mL    Comment: Performed at [facility], [place] [address] [place], [place] [address]  Cortisol-am, blood     Status: Abnormal   Collection Time: 04/18/18  3:15 AM  Result Value Ref Range   Cortisol -  AM 5.5 (L) 6.7 - 22.6 ug/dL    Comment: Performed at [facility], [place] [address] [place], [place] [address]  Procalcitonin     Status: None   Collection Time: 04/18/18  3:15 AM  Result Value Ref Range   Procalcitonin 0.46 ng/mL    Comment:        Interpretation: PCT (Procalcitonin) <= 0.5 ng/mL: Systemic infection (sepsis) is not likely. Local bacterial infection is possible. (NOTE)       Sepsis PCT Algorithm           Lower Respiratory Tract                                      Infection PCT Algorithm    ----------------------------      ----------------------------         PCT < 0.25 ng/mL                PCT < 0.10 ng/mL         Strongly encourage             Strongly discourage   discontinuation of antibiotics    initiation of antibiotics    ----------------------------     -----------------------------       PCT 0.25 - 0.50 ng/mL            PCT 0.10 - 0.25 ng/mL               OR       >80% decrease in PCT            Discourage initiation of                                            antibiotics      Encourage discontinuation           of antibiotics    ----------------------------     -----------------------------         PCT >= 0.50 ng/mL              PCT 0.26 - 0.50 ng/mL               AND        <80% decrease in PCT             Encourage initiation of                                             antibiotics       Encourage continuation           of antibiotics    ----------------------------     -----------------------------        PCT >= 0.50 ng/mL                  PCT > 0.50 ng/mL               AND         increase in PCT                  Strongly encourage  initiation of antibiotics    Strongly encourage escalation           of antibiotics                                     -----------------------------                                           PCT <= 0.25 ng/mL                                                 OR                                        > 80% decrease in PCT                                     Discontinue / Do not initiate                                             antibiotics Performed at [facility] [address], [place], [place] [address]   CBG monitoring, ED     Status: None   Collection Time: 04/18/18  7:52 AM  Result Value Ref Range   Glucose-Capillary 99 70 - 99 mg/dL  Basic metabolic panel     Status: Abnormal   Collection Time: 04/18/18 11:24 AM  Result Value Ref Range   Sodium 136 135 - 145 mmol/L   Potassium 3.9  3.5 - 5.1 mmol/L   Chloride 108 98 - 111 mmol/L   CO2 21 (L) 22 - 32 mmol/L   Glucose, Bld 105 (H) 70 - 99 mg/dL   BUN 13 8 - 23 mg/dL   Creatinine, Ser 1.08 0.61 - 1.24 mg/dL   Calcium 7.3 (L) 8.9 - 10.3 mg/dL   GFR calc non Af Amer >60 >60 mL/min   GFR calc Af Amer >60 >60 mL/min   Anion gap 7 5 - 15    Comment: Performed at [facility], [place] [address] [place], [place] [address]  CBC     Status: Abnormal   Collection Time: 04/18/18 11:24 AM  Result Value Ref Range   WBC 10.2 4.0 - 10.5 K/uL   RBC 3.35 (L) 4.22 - 5.81 MIL/uL   Hemoglobin 11.4 (L) 13.0 - 17.0 g/dL   HCT 35.1 (L) 39.0 - 52.0 %   MCV 104.8 (H) 80.0 - 100.0 fL   MCH 34.0 26.0 - 34.0 pg   MCHC 32.5 30.0 - 36.0 g/dL   RDW 18.1 (H) 11.5 - 15.5 %   Platelets 44 (L) 150 - 400 K/uL    Comment: REPEATED TO VERIFY Immature Platelet Fraction may be clinically indicated, consider ordering this additional test [id number] CRITICAL VALUE NOTED.  VALUE IS CONSISTENT WITH PREVIOUSLY REPORTED AND CALLED VALUE.    nRBC 0.2  0.0 - 0.2 %    Comment: Performed at [facility], [place] [address] [place], [place] [address]   Dg Chest Port 1 View  Result Date: 04/17/2018 CLINICAL DATA:  Fever. Weakness. EXAM: PORTABLE CHEST 1 VIEW COMPARISON:  Chest CT 04/06/2018 FINDINGS: Tip of the right chest port in the mid SVC. Upper normal heart size with unchanged mediastinal contours. Aortic atherosclerosis. No focal airspace disease. No pulmonary edema, pneumothorax, or large pleural effusion. No acute osseous abnormalities are seen. IMPRESSION: No acute chest findings. Electronically Signed   By: Keith Rake M.D.   On: 04/17/2018 21:23    Pending Labs Unresulted Labs (From admission, onward)    Start     Ordered   04/17/18 2339  Lactic acid, plasma  Now then every 2 hours,   STAT     04/17/18 2338   04/17/18 2054  Urine culture  ONCE - STAT,   STAT     04/17/18 2055           Vitals/Pain Today's Vitals   04/18/18 1030 04/18/18 1049 04/18/18 1100 04/18/18 1130  BP: 124/67   114/65  Pulse: 96  92 92  Resp: (!) 26  (!) 23 (!) 21  Temp:  (!) 101.1 F (38.4 C)    TempSrc:  Oral    SpO2: 92%  94% 90%  Weight:      Height:      PainSc:        Isolation Precautions Droplet precaution  Medications Medications  oseltamivir (TAMIFLU) capsule 75 mg (75 mg Oral Given 04/18/18 1031)  acetaminophen (TYLENOL) tablet 500 mg (500 mg Oral Given 04/18/18 1107)  allopurinol (ZYLOPRIM) tablet 300 mg (300 mg Oral Given 04/18/18 1031)  HYDROcodone-acetaminophen (NORCO) 10-325 MG per tablet 1 tablet (has no administration in time range)  acyclovir (ZOVIRAX) tablet 400 mg (400 mg Oral Given 04/18/18 1031)  rosuvastatin (CRESTOR) tablet 5 mg (5 mg Oral Given 04/18/18 1031)  ALPRAZolam (XANAX) tablet 0.25 mg (has no administration in time range)  nortriptyline (PAMELOR) capsule 100 mg (has no administration in time range)  famotidine (PEPCID) tablet 20 mg (20 mg Oral Given 04/18/18 1031)  pregabalin (LYRICA) capsule 50 mg (has no administration in time range)  cholecalciferol (VITAMIN D3) tablet 1,000 Units (1,000 Units Oral Given 04/18/18 1031)  fluticasone (FLONASE) 50 MCG/ACT nasal spray 2 spray (2 sprays Each Nare Given 04/18/18 1032)  0.9 %  sodium chloride infusion ( Intravenous New Bag/Given 04/18/18 0318)  dextromethorphan-guaiFENesin (MUCINEX DM) 30-600 MG per 12 hr tablet 1 tablet (has no administration in time range)  albuterol (PROVENTIL) (2.5 MG/3ML) 0.083% nebulizer solution 2.5 mg (has no administration in time range)  insulin aspart (novoLOG) injection 0-9 Units (0 Units Subcutaneous Not Given 04/18/18 0757)  insulin aspart (novoLOG) injection 0-5 Units (0 Units Subcutaneous Not Given 04/18/18 0211)  ondansetron (ZOFRAN) tablet 4 mg (has no administration in time range)    Or  ondansetron (ZOFRAN) injection 4 mg (has no administration in time range)  hydrALAZINE (APRESOLINE)  injection 5 mg (has no administration in time range)  pregabalin (LYRICA) capsule 100 mg (has no administration in time range)  B-complex with vitamin C tablet 1 tablet (1 tablet Oral Given 04/18/18 1031)  acetaminophen (TYLENOL) tablet 1,000 mg (1,000 mg Oral Given 04/17/18 2100)  sodium chloride 0.9 % bolus 1,000 mL (0 mLs Intravenous Stopped 04/18/18 0054)    And  sodium chloride 0.9 % bolus 1,000 mL (0 mLs Intravenous Stopped 04/18/18 0054)  And  sodium chloride 0.9 % bolus 1,000 mL (0 mLs Intravenous Stopped 04/18/18 0148)  oseltamivir (TAMIFLU) capsule 75 mg (75 mg Oral Given 04/17/18 2335)  oxyCODONE (Oxy IR/ROXICODONE) immediate release tablet 5 mg (5 mg Oral Given 04/17/18 2335)  sodium chloride 0.9 % bolus 1,000 mL (0 mLs Intravenous Stopped 04/18/18 0148)  sodium chloride 0.9 % bolus 500 mL (0 mLs Intravenous Stopped 04/18/18 0430)  hydrocortisone sodium succinate (SOLU-CORTEF) 100 MG injection 100 mg (100 mg Intravenous Given 04/18/18 1107)    Mobility walks Moderate fall risk   Focused Assessments Cardiac Assessment Handoff:    Lab Results  Component Value Date   CKTOTAL 48 06/29/2009   CKMB 2.5 06/29/2009   TROPONINI (H) 06/29/2009    0.43        PERSISTENTLY INCREASED TROPONIN VALUES IN THE RANGE OF 0.06-0.49 ng/mL CAN BE SEEN IN:       -UNSTABLE ANGINA       -CONGESTIVE HEART FAILURE       -MYOCARDITIS       -CHEST TRAUMA       -ARRYHTHMIAS       -LATE PRESENTING MI       -COPD   CLINICAL FOLLOW-UP RECOMMENDED.   No results found for: DDIMER Does the Patient currently have chest pain? No  , Neuro Assessment Handoff:  Swallow screen pass? Yes      Last date known well: 04/16/18   Neuro Assessment:   Neuro Checks:      Last Documented NIHSS Modified Score:   Has TPA been given? No If patient is a Neuro Trauma and patient is going to OR before floor call report to [facility] nurse: [phone] or [phone]  , Pulmonary Assessment Handoff:  Lung sounds:   O2  Device: Nasal Cannula O2 Flow Rate (L/min): 2 L/min      R Recommendations: See Admitting Provider Note  Report given to:   Additional Notes: None

## 2018-04-18 NOTE — Progress Notes (Signed)
Pt refuses to take one time dose of morphine. Pt states " I dont want it" pt states his back hurts but will not give a rating for his pain. Pt encouraged to take if back is hurting however pt still refuses.

## 2018-04-18 NOTE — ED Notes (Signed)
Provider notified of blood pressure. Fluids running. Pt in no acute distress, resting comfortably.

## 2018-04-18 NOTE — Progress Notes (Signed)
Patient very agitated and keeps trying to jump out of bed about every 5 minutes even after giving xanax and vidodin. Paged K. Schorr. Order for 1 mg IV ativan ordered and given. Will continue to monitor.

## 2018-04-19 ENCOUNTER — Encounter (HOSPITAL_COMMUNITY): Payer: Self-pay

## 2018-04-19 LAB — CBC WITH DIFFERENTIAL/PLATELET
Abs Immature Granulocytes: 0.05 10*3/uL (ref 0.00–0.07)
BASOS PCT: 0 %
Basophils Absolute: 0 10*3/uL (ref 0.0–0.1)
EOS ABS: 0.1 10*3/uL (ref 0.0–0.5)
Eosinophils Relative: 1 %
HCT: 37.1 % — ABNORMAL LOW (ref 39.0–52.0)
Hemoglobin: 12.2 g/dL — ABNORMAL LOW (ref 13.0–17.0)
Immature Granulocytes: 1 %
Lymphocytes Relative: 71 %
Lymphs Abs: 5.9 10*3/uL — ABNORMAL HIGH (ref 0.7–4.0)
MCH: 34.1 pg — ABNORMAL HIGH (ref 26.0–34.0)
MCHC: 32.9 g/dL (ref 30.0–36.0)
MCV: 103.6 fL — ABNORMAL HIGH (ref 80.0–100.0)
Monocytes Absolute: 0.3 10*3/uL (ref 0.1–1.0)
Monocytes Relative: 3 %
Neutro Abs: 2.1 10*3/uL (ref 1.7–7.7)
Neutrophils Relative %: 24 %
Platelets: 40 10*3/uL — ABNORMAL LOW (ref 150–400)
RBC: 3.58 MIL/uL — ABNORMAL LOW (ref 4.22–5.81)
RDW: 18.1 % — AB (ref 11.5–15.5)
WBC: 8.4 10*3/uL (ref 4.0–10.5)
nRBC: 0 % (ref 0.0–0.2)

## 2018-04-19 LAB — GLUCOSE, CAPILLARY
GLUCOSE-CAPILLARY: 68 mg/dL — AB (ref 70–99)
Glucose-Capillary: 81 mg/dL (ref 70–99)
Glucose-Capillary: 90 mg/dL (ref 70–99)
Glucose-Capillary: 80 mg/dL (ref 70–99)
Glucose-Capillary: 80 mg/dL (ref 70–99)

## 2018-04-19 LAB — URINE CULTURE: Culture: NO GROWTH

## 2018-04-19 MED ORDER — METOPROLOL SUCCINATE ER 25 MG PO TB24
12.5000 mg | ORAL_TABLET | Freq: Every day | ORAL | Status: DC
  Administered 2018-04-19 – 2018-04-21 (×3): 12.5 mg via ORAL
  Filled 2018-04-19 (×3): qty 1

## 2018-04-19 MED ORDER — ALPRAZOLAM 0.25 MG PO TABS: 0.2500 mg | ORAL_TABLET | Freq: Every day | ORAL | Status: DC | PRN

## 2018-04-19 MED ORDER — CHLORHEXIDINE GLUCONATE CLOTH 2 % EX PADS
6.0000 | MEDICATED_PAD | Freq: Every day | CUTANEOUS | Status: DC
  Administered 2018-04-19 – 2018-04-21 (×3): 6 via TOPICAL

## 2018-04-19 MED ORDER — HYDROCODONE-ACETAMINOPHEN 10-325 MG PO TABS
1.0000 | ORAL_TABLET | Freq: Four times a day (QID) | ORAL | Status: DC | PRN
  Administered 2018-04-20: 1 via ORAL
  Filled 2018-04-19: qty 1

## 2018-04-19 MED ORDER — METOPROLOL SUCCINATE ER 25 MG PO TB24: 25.0000 mg | ORAL_TABLET | Freq: Every day | ORAL | Status: DC

## 2018-04-19 MED ORDER — ACETAMINOPHEN 500 MG PO TABS: 500.0000 mg | ORAL_TABLET | Freq: Four times a day (QID) | ORAL | Status: DC | PRN

## 2018-04-19 NOTE — Progress Notes (Signed)
PROGRESS NOTE  CAYTON CUEVAS IOE:[id number] DOB: Jul 31, 1950 DOA: 04/17/2018 PCP: [name]., PA-C  HPI/Recap of past 24 hours: Ryan Copeland is a 68 y.o. 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 Influenza A PCR positive. TRH asked to admit for treatment.   04/19/18: Seen and examined at his bedside.  Somnolent post Ativan injection.  Does not appear in distress.  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)   CAD (coronary artery disease)   Diabetes mellitus without complication (HCC)   Anxiety   Thrombocytopenia (HCC)   COPD mixed type (HCC)   Sepsis (HCC)   Chronic diastolic CHF (congestive heart failure) (HCC)   Sepsis secondary to influenza A infection Presented with leukocytosis, fever, tachypnea, hypotension Influenza A PCR positive Responded well to IV fluids Continue to maintain O2 saturation greater than 92% Continue nebs and antitussive Sepsis physiology is resolving  Acute hypoxic respiratory failure secondary to influenza A infection Maintain O2 saturation greater than 92% Management as stated above Home O2 evaluation for discharge planning  Resolving hypotension secondary to sepsis Responded well to IV fluid DC IV fluid Continue to closely monitor vital signs Continue to hold off home Lasix dose for now  Sinus tachycardia Resume home metoprolol, start at the lower dose Continue to closely monitor vital signs  Chronic diastolic CHF Appears euvolemic Continue to hold Lasix for now  Continue strict I's and O's and daily weight  GERD: Continue Pepcid  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 Addison Bailey -hold Calquence for now. Defer to oncology to restart Calquence  CAD (coronary artery disease): s/p of stent. no CP.  -continue crestor  Type 2 diabetes with hypo-glycemia  Last A1c 6.2on 04/13/18 Patient is taking metformin at home -SSI  Chronic anxiety: -As needed Xanax  Chronic back pain Resume home pain medications  Chronic thrombocytopenia ([place]);  Platelet 40 K from 44K   Likely due to chemotherapy.   No bleeding tendency. -Follow-up with CBC  COPD mixed type ([place]): stable. -Continue bronchodilators   Risk: High risk for decompensation due to newly diagnosed influenza A in the setting of immunosuppression, multiple comorbidities and advanced age.  Patient will require at least 2 midnights for further evaluation and treatment of present condition.    DVT ppx: SCD due to thrombocytopenia  Code Status: Full code Family Communication: None at bed side.    Disposition Plan:   DC possibly to home tomorrow 04/20/2018 Consults called: None  Objective: Vitals:   04/19/18 0400 04/19/18 0520 04/19/18 0600 04/19/18 0800  BP:  (!) 153/90 (!) 152/73 (!) 137/55  Pulse:  99 (!) 102 (!) 104  Resp:  14 20 19   Temp: 98.7 F (37.1 C)   99.8 F (37.7 C)  TempSrc: Oral   Axillary  SpO2:  99% 99% 96%  Weight:      Height:        Intake/Output Summary (Last 24 hours) at 04/19/2018 0958 Last data filed at 04/19/2018 0800 Gross per 24 hour  Intake 2756.95 ml  Output 1150 ml  Net 1606.95 ml   Filed Weights   04/17/18 2056 04/18/18 1223 04/19/18 0358  Weight: 99.8 kg 99.6 kg 99 kg    Exam:  . General: 68 y.o. year-old male obese in no acute distress.  Somnolent post Ativan injection. . Cardiovascular: Regular rate and rhythm with no rubs or gallops.  No JVD or thyromegaly . Respiratory: Clear to auscultation with no wheezes or rales.  Poor inspiratory effort. . Abdomen: Soft nontender mildly  distended with normal bowel sounds x4 quadrants. . Musculoskeletal: Trace lower extremity edema. 2/4 pulses in all 4 extremities. Marland Kitchen Psychiatry: Mood is appropriate for condition and setting   Data Reviewed: CBC: Recent Labs  Lab 04/17/18 2139 04/18/18 1124 04/19/18 0646  WBC 17.5* 10.2 8.4  NEUTROABS 4.9  --  2.1  HGB 13.4 11.4* 12.2*  HCT 41.6 35.1* 37.1*  MCV 104.8* 104.8* 103.6*  PLT 62* 44* 40*   Basic Metabolic Panel: Recent Labs  Lab 04/17/18 2139 04/18/18 1124  NA 135 136  K 4.3 3.9  CL 105 108  CO2 23 21*  GLUCOSE 125* 105*  BUN 14 13  CREATININE 1.11 1.08  CALCIUM 8.3* 7.3*   GFR: Estimated Creatinine Clearance: 82 mL/min (by C-G formula based on SCr of 1.08 mg/dL). Liver Function Tests: Recent Labs  Lab 04/17/18 2139  AST 17  ALT 10  ALKPHOS 72  BILITOT 1.1  PROT 6.2*  ALBUMIN 3.6   No results for input(s): LIPASE, AMYLASE in the last 168 hours. No results for input(s): AMMONIA in the last 168 hours. Coagulation Profile: No results for input(s): INR, PROTIME in the last 168 hours. Cardiac Enzymes: No results for input(s): CKTOTAL, CKMB, CKMBINDEX, TROPONINI in the last 168 hours. BNP (last 3 results) No results for input(s): PROBNP in the last 8760 hours. HbA1C: No results for input(s): HGBA1C in the last 72 hours. CBG: Recent Labs  Lab 04/18/18 0752 04/18/18 1226 04/18/18 1609 04/18/18 2119 04/19/18 0802  GLUCAP 99 94 114* 83 68*   Lipid Profile: No results for input(s): CHOL, HDL, LDLCALC, TRIG, CHOLHDL, LDLDIRECT in the last 72 hours. Thyroid Function Tests: No results for input(s): TSH, T4TOTAL, FREET4, T3FREE, THYROIDAB in the last 72 hours. Anemia Panel: No results for input(s): VITAMINB12, FOLATE, FERRITIN, TIBC, IRON, RETICCTPCT in the last 72 hours. Urine analysis:    Component Value Date/Time   COLORURINE YELLOW 04/18/2018 0100   APPEARANCEUR CLEAR 04/18/2018 0100   LABSPEC 1.019 04/18/2018 0100   PHURINE 5.0 04/18/2018  0100   GLUCOSEU NEGATIVE 04/18/2018 0100   HGBUR NEGATIVE 04/18/2018 0100   BILIRUBINUR NEGATIVE 04/18/2018 0100   KETONESUR 5 (A) 04/18/2018 0100   PROTEINUR NEGATIVE 04/18/2018 0100   UROBILINOGEN 0.2 02/03/2014 1356   NITRITE NEGATIVE 04/18/2018 0100   LEUKOCYTESUR NEGATIVE 04/18/2018 0100   Sepsis Labs: @LABRCNTIP (procalcitonin:4,lacticidven:4)  ) Recent Results (from the past 240 hour(s))  Blood Culture (routine x 2)     Status: None (Preliminary result)   Collection Time: 04/17/18  9:20 PM  Result Value Ref Range Status   Specimen Description   Final    BLOOD LEFT WRIST Performed at [facility], [place] [address], [place], [place] [address]    Special Requests   Final    BOTTLES DRAWN AEROBIC AND ANAEROBIC Blood Culture adequate volume Performed at [place] [address], [place], [place] [address]    Culture   Final    NO GROWTH < 12 HOURS Performed at [place] [address], [place],  [address]    Report Status PENDING  Incomplete  Blood  Culture (routine x 2)     Status: None (Preliminary result)   Collection Time: 04/17/18  9:47 PM  Result Value Ref Range Status   Specimen Description   Final    BLOOD SITE NOT SPECIFIED Performed at [facility], [address] [place], [place] [address]    Special Requests   Final    BOTTLES DRAWN AEROBIC AND ANAEROBIC Blood Culture adequate volume Performed at [place] [address], [place], [place] [address]    Culture   Final    NO GROWTH < 12 HOURS Performed at [place] [address], [place], [place] [address]    Report Status PENDING  Incomplete  Urine culture     Status: None   Collection Time: 04/18/18  1:00 AM  Result Value Ref Range Status   Specimen Description   Final    URINE, CLEAN CATCH Performed at [facility], [place] [address] [place], [place] [address]    Special Requests   Final     NONE Performed at [facility], [place] [address], [place], [place] [address]    Culture   Final    NO GROWTH Performed at [facility], [place] [address], [place], [place] [address]    Report Status 04/19/2018 FINAL  Final  MRSA PCR Screening     Status: None   Collection Time: 04/18/18 12:26 PM  Result Value Ref Range Status   MRSA by PCR NEGATIVE NEGATIVE Final    Comment:        The GeneXpert MRSA Assay (FDA approved for NASAL specimens only), is one component of a comprehensive MRSA colonization surveillance program. It is not intended to diagnose MRSA infection nor to guide or monitor treatment for MRSA infections. Performed at [facility], [place] [address] [place], [place] [address]       Studies: No results found.  Scheduled Meds: . acyclovir  400 mg Oral BID  . allopurinol  300 mg Oral Daily  . B-complex with vitamin C  1 tablet Oral Daily  . Chlorhexidine Gluconate Cloth  6 each Topical Daily  . cholecalciferol  1,000 Units Oral Daily  . famotidine  20 mg Oral BID  . fluticasone  2 spray Each Nare Daily  . insulin aspart  0-5 Units Subcutaneous QHS  . insulin aspart  0-9 Units Subcutaneous TID WC  . lidocaine  2 patch Transdermal Q24H  . mouth rinse  15 mL Mouth Rinse BID  . nortriptyline  100 mg Oral QHS  . oseltamivir  75 mg Oral BID  . pregabalin  100 mg Oral QHS  . pregabalin  50 mg Oral TID WC  . rosuvastatin  5 mg Oral Daily    Continuous Infusions:    LOS: 1 day     Kayleen Memos, MD [facility] Pager [phone]  If 7PM-7AM, please contact night-coverage [email or web address] Password [facility] 04/19/2018, 9:58 AM

## 2018-04-19 NOTE — Progress Notes (Signed)
Hypoglycemic Event  CBG: 68  Treatment: 8 oz of orange juice   Symptoms: none  Follow-up CBG: Time: 0832 CBG Result: 81  Possible Reasons for Event: none  Comments/MD notified: ordered pt breakfast, will continue to monitor and notify MD if it gets low again.     Renette Butters Mette [place]

## 2018-04-19 NOTE — Evaluation (Signed)
Physical Therapy Evaluation Patient Details Name: Ryan Copeland MRN: [id number] DOB: 10-13-1950 Today's Date: 04/19/2018   History of Present Illness  Ryan Copeland is a 69 y.o. 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 Influenza A PCR positive  Clinical Impression  The  Patient is lethargic and very unsteady and unable to functionally ambulate, required 2 assisting and the recliner brought up as patient could not balance. The patient's wife present, reports that patient works and this is not his baseline. Pt admitted with above diagnosis. Pt currently with functional limitations due to the deficits listed below (see PT Problem List).  Pt will benefit from skilled PT to increase their independence and safety with mobility to allow discharge to the venue listed below.       Follow Up Recommendations Home health PT;SNF(depends on  progress and ability to return independence)    Equipment Recommendations  (tba)    Recommendations for Other Services       Precautions / Restrictions Precautions Precautions: Fall      Mobility  Bed Mobility               General bed mobility comments: sitting on bed edge, eyes closed, drifting backwards  Transfers Overall transfer level: Needs assistance Equipment used: Rolling walker (2 wheeled) Transfers: Sit to/from Stand Sit to Stand: Mod assist;+2 safety/equipment         General transfer comment: steady assist to rise, multimodal cues   Ambulation/Gait Ambulation/Gait assistance: Mod assist;+2 physical assistance;+2 safety/equipment Gait Distance (Feet): 30 Feet Assistive device: Rolling walker (2 wheeled) Gait Pattern/deviations: Step-to pattern;Staggering left;Staggering right;Drifts right/left;Decreased  stride length Gait velocity: decr   General Gait Details: the patient required constant cueing and assistance  to use RW. patient is very unsteady and is unable to stay inside RW nor use correctly. Recliner brought up for pt. to sit down  Stairs            Wheelchair Mobility    Modified Rankin (Stroke Patients Only)       Balance Overall balance assessment: Needs assistance Sitting-balance support: Feet supported;Bilateral upper extremity supported Sitting balance-Leahy Scale: Poor Sitting balance - Comments: lethargic and is drifting posteriorly Postural control: Posterior lean Standing balance support: Bilateral upper extremity supported;During functional activity Standing balance-Leahy Scale: Poor                               Pertinent Vitals/Pain Pain Assessment: No/denies pain    Home Living Family/patient expects to be discharged to:: Private residence Living Arrangements: Spouse/significant other Available Help at Discharge: Family Type of Home: House Home Access: Stairs to enter Entrance Stairs-Rails: Right Entrance Stairs-Number of Steps: 3-4 Home Layout: One level Home Equipment: None      Prior Function Level of Independence: Independent         Comments: works Engineer, mining        Extremity/Trunk Assessment   Upper Extremity Assessment Upper Extremity Assessment: Defer to OT evaluation    Lower Extremity Assessment Lower Extremity Assessment: Generalized weakness    Cervical / Trunk Assessment Cervical / Trunk Assessment: Normal  Communication   Communication: (speech os slurred, patient is lethargic)  Cognition Arousal/Alertness: Lethargic;Suspect due to medications(had dose of Ativan 10PM per RN)  Behavior During Therapy: Flat affect Overall Cognitive Status: Impaired/Different from baseline Area of Impairment: Orientation;Attention;Following commands;Safety/judgement;Awareness;Problem solving                  Orientation Level: Disoriented to;Time;Situation Current Attention Level: Selective   Following Commands: Follows one step commands inconsistently;Follows one step commands with increased time Safety/Judgement: Decreased awareness of safety;Decreased awareness of deficits Awareness: Emergent Problem Solving: Slow processing;Decreased initiation;Difficulty sequencing;Requires verbal cues;Requires tactile cues        General Comments      Exercises     Assessment/Plan    PT Assessment Patient needs continued PT services  PT Problem List Decreased strength;Decreased cognition;Decreased activity tolerance;Decreased knowledge of use of DME;Decreased balance;Decreased safety awareness;Decreased mobility;Decreased knowledge of precautions       PT Treatment Interventions DME instruction;Therapeutic exercise    PT Goals (Current goals can be found in the Care Plan section)  Acute Rehab PT Goals Patient Stated Goal: per wife- return home PT Goal Formulation: With patient/family Time For Goal Achievement: 05/03/18 Potential to Achieve Goals: Good    Frequency Min 3X/week   Barriers to discharge        Co-evaluation               AM-PAC PT "6 Clicks" Mobility  Outcome Measure Help needed turning from your back to your side while in a flat bed without using bedrails?: A Little Help needed moving from lying on your back to sitting on the side of a flat bed without using bedrails?: A Little Help needed moving to and from a bed to a chair (including a wheelchair)?: A Lot Help needed standing up from a chair using your arms (e.g., wheelchair or bedside chair)?: Total Help needed to walk in hospital room?: Total Help needed climbing 3-5 steps with a railing? : Total 6 Click Score: 11    End of Session Equipment Utilized During Treatment: Gait belt Activity Tolerance: Patient limited by lethargy Patient left: in chair;with call bell/phone within reach;with  family/visitor present;with chair alarm set;with nursing/sitter in room Nurse Communication: Mobility status PT Visit Diagnosis: Unsteadiness on feet (R26.81)    Time: 4656-8127 PT Time Calculation (min) (ACUTE ONLY): 22 min   Charges:   PT Evaluation $PT Eval Low Complexity: [place] Pager [phone] Office [phone]   Claretha Cooper 04/19/2018, 1:35 PM

## 2018-04-20 ENCOUNTER — Ambulatory Visit: Payer: Self-pay

## 2018-04-20 ENCOUNTER — Encounter (HOSPITAL_COMMUNITY): Payer: Self-pay | Admitting: *Deleted

## 2018-04-20 LAB — BASIC METABOLIC PANEL
Anion gap: 6 (ref 5–15)
BUN: 12 mg/dL (ref 8–23)
CO2: 23 mmol/L (ref 22–32)
Calcium: 7.8 mg/dL — ABNORMAL LOW (ref 8.9–10.3)
Chloride: 108 mmol/L (ref 98–111)
Creatinine, Ser: 0.92 mg/dL (ref 0.61–1.24)
GFR calc Af Amer: >60 mL/min (ref >60)
GFR calc non Af Amer: >60 mL/min (ref >60)
Glucose, Bld: 78 mg/dL (ref 70–99)
Potassium: 3.3 mmol/L — ABNORMAL LOW (ref 3.5–5.1)
Sodium: 137 mmol/L (ref 135–145)

## 2018-04-20 LAB — GLUCOSE, CAPILLARY
Glucose-Capillary: 76 mg/dL (ref 70–99)
Glucose-Capillary: 81 mg/dL (ref 70–99)

## 2018-04-20 LAB — CBC
HCT: 32.1 % — ABNORMAL LOW (ref 39.0–52.0)
Hemoglobin: 10.3 g/dL — ABNORMAL LOW (ref 13.0–17.0)
MCH: 33.8 pg (ref 26.0–34.0)
MCHC: 32.1 g/dL (ref 30.0–36.0)
MCV: 105.2 fL — ABNORMAL HIGH (ref 80.0–100.0)
Platelets: 36 10*3/uL — ABNORMAL LOW (ref 150–400)
RBC: 3.05 MIL/uL — ABNORMAL LOW (ref 4.22–5.81)
RDW: 17.6 % — ABNORMAL HIGH (ref 11.5–15.5)
WBC: 6.9 10*3/uL (ref 4.0–10.5)
nRBC: 0 % (ref 0.0–0.2)

## 2018-04-20 MED ORDER — MAGNESIUM SULFATE 2 GM/50ML IV SOLN
2.0000 g | Freq: Once | INTRAVENOUS | Status: AC
  Administered 2018-04-20: 2 g via INTRAVENOUS
  Filled 2018-04-20: qty 50

## 2018-04-20 MED ORDER — BENZONATATE 100 MG PO CAPS: 200.0000 mg | ORAL_CAPSULE | Freq: Two times a day (BID) | ORAL | 0 refills | Status: DC | PRN

## 2018-04-20 MED ORDER — METOPROLOL SUCCINATE ER 25 MG PO TB24: 12.5000 mg | ORAL_TABLET | Freq: Every day | ORAL | 0 refills | Status: DC

## 2018-04-20 MED ORDER — OSELTAMIVIR PHOSPHATE 75 MG PO CAPS: 75.0000 mg | ORAL_CAPSULE | Freq: Two times a day (BID) | ORAL | 0 refills | Status: AC

## 2018-04-20 MED ORDER — PREGABALIN 50 MG PO CAPS: 50.0000 mg | ORAL_CAPSULE | Freq: Three times a day (TID) | ORAL | 0 refills | Status: DC

## 2018-04-20 MED ORDER — METHYLPREDNISOLONE SODIUM SUCC 125 MG IJ SOLR
60.0000 mg | Freq: Once | INTRAMUSCULAR | Status: AC
  Administered 2018-04-20: 60 mg via INTRAVENOUS
  Filled 2018-04-20: qty 2

## 2018-04-20 MED ORDER — FUROSEMIDE 20 MG PO TABS
20.0000 mg | ORAL_TABLET | Freq: Every day | ORAL | Status: DC
  Administered 2018-04-20 – 2018-04-21 (×2): 20 mg via ORAL
  Filled 2018-04-20 (×2): qty 1

## 2018-04-20 MED ORDER — POTASSIUM CHLORIDE CRYS ER 20 MEQ PO TBCR
40.0000 meq | EXTENDED_RELEASE_TABLET | Freq: Once | ORAL | Status: AC
  Administered 2018-04-20: 40 meq via ORAL
  Filled 2018-04-20: qty 2

## 2018-04-20 NOTE — Progress Notes (Signed)
Physical Therapy Treatment Patient Details Name: Ryan Copeland MRN: [id number] DOB: 23-Aug-1950 Today's Date: 04/20/2018    History of Present Illness Ryan Copeland is a 68 y.o. 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 Influenza A PCR positive    PT Comments    Patient's wife present and assisted with patient ambulation. Attempted again without Rw with noted decreased balance. Patient does have a H/O neuropathy.  .Oxygen saturation qualification completed, see separate note. Patient did drop to 87% on RA. Wife states that she is comfortable with patient Dcchome. Recommended that patient have assistance to ambulate until improved with balance.   recommend HHPT.   Follow Up Recommendations  Home health PT     Equipment Recommendations  (patient has a RW)    Recommendations for Other Services       Precautions / Restrictions Precautions Precautions: Fall Precaution Comments: monitor O2 sats Restrictions Weight Bearing Restrictions: (P) No    Mobility  Bed Mobility Overal bed mobility: Needs Assistance Bed Mobility: Supine to Sit     Supine to sit: Min assist;HOB elevated     General bed mobility comments: oob  Transfers Overall transfer level: Needs assistance Equipment used: Rolling walker (2 wheeled);None Transfers: Sit to/from Stand Sit to Stand: Supervision         General transfer comment: cues for safety, use of RW,   Ambulation/Gait Ambulation/Gait assistance: Min assist;Min guard Gait Distance (Feet): 200 Feet Assistive device: Rolling walker (2 wheeled);None Gait Pattern/deviations: Step-through pattern;Decreased stride length Gait velocity: decr   General Gait Details: patient used RW except for 25' where patient demonstrated  decreased balance, stride length decreased and staggered. Patient is safer with UE support. Saturation drop to 87% on RA. Ambulated x 200' on 2 liters [place] [place] with drop to 91%.     Stairs             Wheelchair Mobility    Modified Rankin (Stroke Patients Only)       Balance Overall balance assessment: Needs assistance Sitting-balance support: Feet supported;No upper extremity supported Sitting balance-Leahy Scale: Good Sitting balance - Comments: sits at midline, no drifting   Standing balance support: During functional activity;No upper extremity supported Standing balance-Leahy Scale: Poor Standing balance comment: relinat for UE support for balance                            Cognition Arousal/Alertness: Awake/alert Behavior During Therapy: Flat affect Overall Cognitive Status: Within Functional Limits for tasks assessed Area of Impairment: Orientation;Awareness;Safety/judgement                 Orientation Level: Time;Situation Current Attention Level: Sustained   Following Commands: (P) Follows one step commands consistently Safety/Judgement: (P) Decreased awareness of safety;Decreased awareness of deficits Awareness: Emergent   General Comments: (P) patient  demonstrates improved arousal, able to participate in therapy readily and was awake the entir time. 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      Exercises      General Comments        Pertinent Vitals/Pain Pain Assessment: (P) Faces Faces Pain Scale: Hurts little more Pain Location: back Pain Descriptors / Indicators: Aching;Tightness Pain Intervention(s): Premedicated before session  Home Living Family/patient expects to be discharged to:: (P) Private residence Living Arrangements: (P) Spouse/significant other Available Help at Discharge: (P) Family         Home Equipment: (P) None       Prior Function Level of Independence: (P) Independent      Comments: (P) works FT   PT Goals (current goals can now be found in the care plan section) Progress towards PT goals: Progressing toward goals    Frequency    Min 3X/week      PT Plan Current plan remains appropriate    Co-evaluation PT/OT/SLP Co-Evaluation/Treatment: Yes Reason for Co-Treatment: (P) For patient/therapist safety PT goals addressed during session: (P) Mobility/safety with mobility OT goals addressed during session: (P) ADL's and self-care      AM-PAC PT "6 Clicks" Mobility   Outcome Measure  Help needed turning from your back to your side while in a flat bed without using bedrails?: A Little Help needed moving from lying on your back to sitting on the side of a flat bed without using bedrails?: A Little Help needed moving to and from a bed to a chair (including a wheelchair)?: A Little Help needed standing up from a chair using your arms (e.g., wheelchair or bedside chair)?: A Lot Help needed to walk in hospital room?: A Lot Help needed climbing 3-5 steps with a railing? : Total 6 Click Score: 14    End of Session Equipment Utilized During Treatment: Gait belt;Oxygen Activity Tolerance: Patient tolerated treatment well Patient left: in chair;with call bell/phone within reach;with chair alarm set;with family/visitor present Nurse Communication: Mobility status PT Visit Diagnosis: Unsteadiness on feet (R26.81)     Time: 2574-9355 PT Time Calculation (min) (ACUTE ONLY): 35 min  Charges:  $Gait Training: 23-37 mins                     [place] Pager [phone] Office [phone]    Claretha Cooper 04/20/2018, 11:22 AM

## 2018-04-20 NOTE — Care Management Important Message (Signed)
Important Message  Patient Details  Name: Ryan Copeland MRN: [id number] Date of Birth: 1950/09/07   Medicare Important Message Given:  Yes. CMA gave Medicare Important Message to patient's Care Manager Nurse.     Gaetana Kawahara 04/20/2018, 9:10 AM

## 2018-04-20 NOTE — Progress Notes (Signed)
Hypoglycemic Event  CBG: 66  Treatment: 4 oz juice/soda  Symptoms: None  Follow-up CBG: Time:88 CBG Result:5:55 pm  Possible Reasons for Event: Inadequate meal intake  Comments/MD notified:    Erma Heritage

## 2018-04-20 NOTE — Progress Notes (Signed)
PROGRESS NOTE  Ryan Copeland:[id number] DOB: 1950/08/17 DOA: 04/17/2018 PCP: Ryan Halim., PA-C  HPI/Recap of past 24 hours: Ryan Copeland is a 68 y.o. 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 Influenza A PCR positive. TRH asked to admit for treatment.  04/20/18: Improving, still hypoxic with ambulation.  Saturation on room air while ambulating 87%.  We will continue treatment for influenza A infection in the setting of immunosuppression.    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)   CAD (coronary artery disease)   Diabetes mellitus without complication (HCC)   Anxiety   Thrombocytopenia (HCC)   COPD mixed type (HCC)   Sepsis (HCC)   Chronic diastolic CHF (congestive heart failure) (HCC)   Sepsis secondary to influenza A infection Sepsis physiology is resolving Presented with leukocytosis, fever, tachypnea, hypotension Influenza A PCR positive Responded well to IV fluids Continue to maintain O2 saturation greater than 92% Continue nebs and antitussive  Persistent but improving acute hypoxic respiratory failure secondary to influenza A infection Maintain O2 saturation greater than 92% Management as stated above Failed home O2 evaluation Saturation 87% on room air while ambulating Give 1 dose of IV Solu-Medrol 60 mg once  We will continue current management Possible discharge tomorrow 04/21/2018  Resolving hypotension secondary to sepsis Responded well to IV fluid DC IV fluid on 04/19/2018 Resume home p.o. Lasix 20 mg daily on 04/20/2018 Continue to closely monitor vital signs  Resolved sinus tachycardia Continue home metoprolol 12.5 mg  daily Continue to closely monitor vital signs  Chronic diastolic CHF Appears euvolemic Resume home Lasix p.o. 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 Addison Bailey -hold Calquence for now. Defer to oncology to restart Calquence  CAD (coronary artery disease): s/p of stent. no CP.  -continue crestor  Type 2 diabetes with hypo-glycemia  Last A1c 6.2on 04/13/18 Patient is taking metformin at home -SSI  Chronic anxiety: -As needed Xanax  Chronic back pain Resume home pain medications  Chronic thrombocytopenia ([place]);  Platelet 36K from 40 K from 44K   Likely due to chemotherapy.  No sign of overt bleeding  COPD mixed type ([place]): stable. -Continue bronchodilators   Risk: High risk for decompensation due to newly diagnosed influenza A in the setting of immunosuppression, multiple comorbidities and advanced age.  Patient will require at least 2 midnights for further evaluation and treatment of present condition.    DVT ppx: SCD due to significant thrombocytopenia  Code Status: Full code Family Communication: None at bed side.    Disposition Plan:   DC possibly to home tomorrow 04/21/2018 Consults called: None  Objective: Vitals:   04/20/18 0812 04/20/18 0900 04/20/18 1000 04/20/18 1100  BP: 132/60  (!) 108/56   Pulse: 89 85 82 82  Resp: 17 19 14 18   Temp:      TempSrc:      SpO2: 98% 92% 92% 97%  Weight:      Height:        Intake/Output Summary (Last 24 hours) at 04/20/2018 1151 Last data filed at  04/20/2018 1100 Gross per 24 hour  Intake 600 ml  Output 500 ml  Net 100 ml   Filed Weights   04/17/18 2056 04/18/18 1223 04/19/18 0358  Weight: 99.8 kg 99.6 kg 99 kg    Exam:  . General: 68 y.o. year-old male obese somnolent but easily arousable to voices. . Cardiovascular: Regular  rate and rhythm with no rubs or gallops.  No JVD or thyromegaly.   Marland Kitchen Respiratory: Mild rales at bases with no wheezes.  Poor inspiratory effort. . Abdomen: Soft nontender mildly distended with normal bowel sounds x4 quadrants. . Musculoskeletal: Trace lower extremity edema. 2/4 pulses in all 4 extremities. Marland Kitchen Psychiatry: Mood is appropriate for condition and setting   Data Reviewed: CBC: Recent Labs  Lab 04/17/18 2139 04/18/18 1124 04/19/18 0646 04/20/18 0509  WBC 17.5* 10.2 8.4 6.9  NEUTROABS 4.9  --  2.1  --   HGB 13.4 11.4* 12.2* 10.3*  HCT 41.6 35.1* 37.1* 32.1*  MCV 104.8* 104.8* 103.6* 105.2*  PLT 62* 44* 40* 36*   Basic Metabolic Panel: Recent Labs  Lab 04/17/18 2139 04/18/18 1124 04/20/18 0509  NA 135 136 137  K 4.3 3.9 3.3*  CL 105 108 108  CO2 23 21* 23  GLUCOSE 125* 105* 78  BUN 14 13 12   CREATININE 1.11 1.08 0.92  CALCIUM 8.3* 7.3* 7.8*   GFR: Estimated Creatinine Clearance: 96.3 mL/min (by C-G formula based on SCr of 0.92 mg/dL). Liver Function Tests: Recent Labs  Lab 04/17/18 2139  AST 17  ALT 10  ALKPHOS 72  BILITOT 1.1  PROT 6.2*  ALBUMIN 3.6   No results for input(s): LIPASE, AMYLASE in the last 168 hours. No results for input(s): AMMONIA in the last 168 hours. Coagulation Profile: No results for input(s): INR, PROTIME in the last 168 hours. Cardiac Enzymes: No results for input(s): CKTOTAL, CKMB, CKMBINDEX, TROPONINI in the last 168 hours. BNP (last 3 results) No results for input(s): PROBNP in the last 8760 hours. HbA1C: No results for input(s): HGBA1C in the last 72 hours. CBG: Recent Labs  Lab 04/19/18 1135 04/19/18 1700 04/19/18 2131 04/20/18 0729 04/20/18 1129  GLUCAP 90 80 80 76 81   Lipid Profile: No results for input(s): CHOL, HDL, LDLCALC, TRIG, CHOLHDL, LDLDIRECT in the last 72 hours. Thyroid Function Tests: No results for input(s): TSH, T4TOTAL, FREET4, T3FREE, THYROIDAB in the last 72 hours. Anemia Panel: No  results for input(s): VITAMINB12, FOLATE, FERRITIN, TIBC, IRON, RETICCTPCT in the last 72 hours. Urine analysis:    Component Value Date/Time   COLORURINE YELLOW 04/18/2018 0100   APPEARANCEUR CLEAR 04/18/2018 0100   LABSPEC 1.019 04/18/2018 0100   PHURINE 5.0 04/18/2018 0100   GLUCOSEU NEGATIVE 04/18/2018 0100   HGBUR NEGATIVE 04/18/2018 0100   BILIRUBINUR NEGATIVE 04/18/2018 0100   KETONESUR 5 (A) 04/18/2018 0100   PROTEINUR NEGATIVE 04/18/2018 0100   UROBILINOGEN 0.2 02/03/2014 1356   NITRITE NEGATIVE 04/18/2018 0100   LEUKOCYTESUR NEGATIVE 04/18/2018 0100   Sepsis Labs: @LABRCNTIP (procalcitonin:4,lacticidven:4)  ) Recent Results (from the past 240 hour(s))  Blood Culture (routine x 2)     Status: None (Preliminary result)   Collection Time: 04/17/18  9:20 PM  Result Value Ref Range Status   Specimen Description   Final    BLOOD LEFT WRIST Performed at [facility], [place] [address], [place], [place] [address]    Special Requests   Final    BOTTLES DRAWN AEROBIC AND ANAEROBIC Blood Culture adequate volume Performed at  [facility], [place] [address], [place], [place] [address]    Culture   Final    NO GROWTH 2 DAYS Performed at [place] [address], [place], [place] [address]    Report Status PENDING  Incomplete  Blood Culture (routine x 2)     Status: None (Preliminary result)   Collection Time: 04/17/18  9:47 PM  Result Value Ref Range Status   Specimen Description   Final    BLOOD SITE NOT SPECIFIED Performed at [facility], [place] [address], [place], [place] [address]    Special Requests   Final    BOTTLES DRAWN AEROBIC AND ANAEROBIC Blood Culture adequate volume Performed at [place] [address] [place], [place] [address]    Culture   Final    NO GROWTH 2 DAYS Performed at [place] [address] [place], [place] [address]    Report Status PENDING  Incomplete   Urine culture     Status: None   Collection Time: 04/18/18  1:00 AM  Result Value Ref Range Status   Specimen Description   Final    URINE, CLEAN CATCH Performed at [facility], [place] [address], [place], [place] [address]    Special Requests   Final    NONE Performed at [facility], [place] [address] [place], [place] [address]    Culture   Final    NO GROWTH Performed at [facility], [place] [address], [place], [place] [address]    Report Status 04/19/2018 FINAL  Final  MRSA PCR Screening     Status: None   Collection Time: 04/18/18 12:26 PM  Result Value Ref Range Status   MRSA by PCR NEGATIVE NEGATIVE Final    Comment:        The GeneXpert MRSA Assay (FDA approved for NASAL specimens only), is one component of a comprehensive MRSA colonization surveillance program. It is not intended to diagnose MRSA infection nor to guide or monitor treatment for MRSA infections. Performed at [facility], [place] [address], [place],  [address]       Studies: No results found.  Scheduled Meds: . acyclovir  400 mg Oral BID  . allopurinol  300 mg Oral Daily  . B-complex with vitamin C  1 tablet Oral Daily  . Chlorhexidine Gluconate Cloth  6 each Topical Daily  . cholecalciferol  1,000 Units Oral Daily  . famotidine  20 mg Oral BID  . fluticasone  2 spray Each Nare Daily  . insulin aspart  0-5 Units Subcutaneous QHS  . insulin aspart  0-9 Units Subcutaneous TID WC  . lidocaine  2 patch Transdermal Q24H  . mouth rinse  15 mL Mouth Rinse BID  . metoprolol succinate  12.5 mg Oral Daily  . nortriptyline  100 mg Oral QHS  . oseltamivir  75 mg Oral BID  . pregabalin  100 mg Oral QHS  . pregabalin  50 mg Oral TID WC  . rosuvastatin  5 mg Oral Daily    Continuous Infusions:    LOS: 2 days     Kayleen Memos, MD [facility] Pager [phone]  If 7PM-7AM, please contact  night-coverage [email or web address] Password [email or web address] 04/20/2018, 11:51 AM

## 2018-04-20 NOTE — Discharge Instructions (Signed)

## 2018-04-20 NOTE — Progress Notes (Signed)
Ryan Copeland 1224 transferred to 1404 in w/ch with RN. He is for tele. Report given to [facility].

## 2018-04-20 NOTE — Progress Notes (Signed)
SATURATION QUALIFICATIONS: (This note is used to comply with regulatory documentation for home oxygen)  Patient Saturations on Room Air at Rest = 100%  Patient Saturations on Room Air while Ambulating = 87%  Patient Saturations on 2 Liters of oxygen while Ambulating = 91%  Please briefly explain why patient needs home oxygen:Saturation drops while ambulating, Requires Oxygen to maintain  > 88%.  Tresa Endo PT Acute Rehabilitation Services Pager [phone] Office [phone]

## 2018-04-20 NOTE — Progress Notes (Signed)
Physical Therapy Treatment Patient Details Name: Ryan Copeland MRN: [id number] DOB: May 14, 1950 Today's Date: 04/20/2018    History of Present Illness Ryan Copeland is a 68 y.o. 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 Influenza A PCR positive    PT Comments    The patient is much more alert, not oriented to time. Patient demonstrates decreased balance if not using RW for stability. Patient currently requires assistance for any ambulation/mobility. Patient is not at his mental and physical baseline of independence.  Follow Up Recommendations  Home health PT;SNF- wife not present.     Equipment Recommendations  Rolling walker with 5" wheels    Recommendations for Other Services       Precautions / Restrictions Precautions Precautions: Fall    Mobility  Bed Mobility Overal bed mobility: Needs Assistance Bed Mobility: Supine to Sit     Supine to sit: Min assist;HOB elevated     General bed mobility comments: light assistance for  trunk to sit upright  Transfers Overall transfer level: Needs assistance Equipment used: Rolling walker (2 wheeled);None Transfers: Sit to/from Stand Sit to Stand: Min guard;Min assist         General transfer comment: cues for safety, use of RW,   Ambulation/Gait Ambulation/Gait assistance: Min assist;Min guard Gait Distance (Feet): 400 Feet Assistive device: Rolling walker (2 wheeled);None Gait Pattern/deviations: Step-through pattern;Decreased stride length Gait velocity: decr   General Gait Details: patient used RW except for 25' where patient demonstrated decreased balance, stride length decreased and staggered. Patient is safer with UE support.   Stairs             Wheelchair Mobility     Modified Rankin (Stroke Patients Only)       Balance Overall balance assessment: Needs assistance Sitting-balance support: Feet supported;No upper extremity supported Sitting balance-Leahy Scale: Good Sitting balance - Comments: sits at midline, no drifting   Standing balance support: During functional activity;No upper extremity supported Standing balance-Leahy Scale: Poor Standing balance comment: relinat for UE support for balance                            Cognition Arousal/Alertness: Awake/alert   Overall Cognitive Status: Impaired/Different from baseline Area of Impairment: Orientation;Awareness;Safety/judgement                 Orientation Level: Time;Situation Current Attention Level: Sustained   Following Commands: Follows one step commands consistently Safety/Judgement: Decreased awareness of safety;Decreased awareness of deficits Awareness: Emergent   General Comments: patient  demonstrates improved arousal, able to participate in therapy readily and was awake the entir time. 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      Exercises      General Comments        Pertinent Vitals/Pain Pain Assessment: Faces Faces Pain Scale: Hurts even more Pain Location: back Pain Descriptors / Indicators: Aching;Tightness Pain Intervention(s): Monitored during session    Home Living                      Prior Function            PT Goals (current goals can now be found in the care plan section)  Progress towards PT goals: Progressing toward goals    Frequency    Min 3X/week      PT Plan Current plan remains appropriate    Co-evaluation PT/OT/SLP Co-Evaluation/Treatment: Yes Reason for Co-Treatment: For patient/therapist safety PT goals addressed during session: Mobility/safety with mobility        AM-PAC PT "6 Clicks" Mobility   Outcome  Measure  Help needed turning from your back to your side while in a flat bed without using bedrails?: A Little Help needed moving from lying on your back to sitting on the side of a flat bed without using bedrails?: A Little Help needed moving to and from a bed to a chair (including a wheelchair)?: A Lot Help needed standing up from a chair using your arms (e.g., wheelchair or bedside chair)?: A Lot Help needed to walk in hospital room?: A Lot Help needed climbing 3-5 steps with a railing? : Total 6 Click Score: 13    End of Session Equipment Utilized During Treatment: Gait belt Activity Tolerance: Patient tolerated treatment well Patient left: in chair;with call bell/phone within reach;with chair alarm set Nurse Communication: Mobility status       Time: 0751-0820 PT Time Calculation (min) (ACUTE ONLY): 29 min  Charges:  $Gait Training: 8-22 mins                     [place] Pager [phone] Office [phone]    Claretha Cooper 04/20/2018, 8:35 AM

## 2018-04-20 NOTE — Evaluation (Signed)
Occupational Therapy Evaluation Patient Details Name: Ryan Copeland MRN: [id number] DOB: Jun 13, 1950 Today's Date: 04/20/2018    History of Present Illness Ryan Copeland is a 68 y.o. 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 Influenza A PCR positive   Clinical Impression   Pt was admitted for the above.  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  Pt currently needs min guard assistance for adls using RW for support/balance.  Will follow in acute setting with supervision level goals for bathroom transfers and to further assess DME needs.  Pt will likely be able to get up from regular commode, although he did have decreased control when sitting in chair.  Recommended he sponge bathe initially, if he doesn't get a seat for tub, until balance improves significantly    Follow Up Recommendations  Supervision/Assistance - 24 hour    Equipment Recommendations  (likely none)    Recommendations for Other Services       Precautions / Restrictions Precautions Precautions: Fall Precaution Comments: monitor O2 sats Restrictions Weight Bearing Restrictions: No      Mobility Bed Mobility Overal bed mobility: Needs Assistance Bed Mobility: Supine to Sit     Supine to sit: Min assist;HOB elevated     General bed mobility comments: oob  Transfers Overall transfer level: Needs assistance Equipment used: Rolling walker (2 wheeled);None Transfers: Sit to/from Stand Sit to Stand: Supervision         General transfer comment: for safety    Balance Overall balance assessment: Needs assistance Sitting-balance support: Feet supported;No upper extremity supported Sitting balance-Leahy Scale: Good Sitting balance - Comments: sits at midline, no  drifting   Standing balance support: During functional activity;No upper extremity supported Standing balance-Leahy Scale: Poor Standing balance comment: walker for balance                           ADL either performed or assessed with clinical judgement   ADL Overall ADL's : Needs assistance/impaired                         Toilet Transfer: Min guard;RW(simulated, chair)             General ADL Comments: pt is able to cross legs for adls.  Set up/min guard assist for adls.  Pt ambulated in hall and much safer with RW:  min guard level.  Min A without this.  Educated pt that he should sponge bathe initially due to decreased standing balance. He has a tub at home     Vision         Perception     Praxis      Pertinent Vitals/Pain Pain Assessment: Faces Faces Pain Scale: Hurts little more Pain Location: back Pain Descriptors / Indicators: Aching;Tightness Pain Intervention(s): Premedicated before session     Hand Dominance     Extremity/Trunk Assessment Upper Extremity Assessment Upper Extremity Assessment: Overall WFL for tasks assessed           Communication Communication Communication: No difficulties   Cognition Arousal/Alertness: Awake/alert Behavior During Therapy: Flat affect Overall Cognitive Status: Within Functional Limits for tasks assessed Area of Impairment: Orientation;Awareness;Safety/judgement                 Orientation  Level: Time;Situation Current Attention Level: Sustained   Following Commands: Follows one step commands consistently Safety/Judgement: Decreased awareness of safety;Decreased awareness of deficits Awareness: Emergent   General Comments: patient  demonstrates improved arousal, able to participate in therapy readily and was awake the entir time. 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   General Comments   vss    Exercises     Shoulder Instructions      Home Living Family/patient expects to be discharged to:: Private residence Living Arrangements: Spouse/significant other Available Help at Discharge: Family               Bathroom Shower/Tub: Teacher, early years/pre: Standard     Home Equipment: None          Prior Functioning/Environment Level of Independence: Independent        Comments: works FT        OT Problem List: Impaired balance (sitting and/or standing);Pain;Decreased knowledge of use of DME or AE      OT Treatment/Interventions: Self-care/ADL training;DME and/or AE instruction;Patient/family education;Balance training    OT Goals(Current goals can be found in the care plan section) Acute Rehab OT Goals Patient Stated Goal: home asap OT Goal Formulation: With patient Time For Goal Achievement: 05/04/18 Potential to Achieve Goals: Good ADL Goals Pt Will Transfer to Toilet: with supervision;regular height toilet;ambulating Pt Will Perform Tub/Shower Transfer: Tub transfer;with supervision(with/without seat)  OT Frequency: Min 2X/week   Barriers to D/C:            Co-evaluation PT/OT/SLP Co-Evaluation/Treatment: Yes Reason for Co-Treatment: For patient/therapist safety PT goals addressed during session: Mobility/safety with mobility OT goals addressed during session: ADL's and self-care      AM-PAC OT "6 Clicks" Daily Activity     Outcome Measure Help from another person eating meals?: None Help from another person taking care of personal grooming?: A Little Help from another person toileting, which includes using toliet, bedpan, or urinal?: A Little Help from another person bathing (including washing, rinsing, drying)?: A Little Help from another person to put on and taking off regular upper body clothing?: A Little Help from another person to put on and taking off regular lower body clothing?: A Little 6 Click Score: 19   End  of Session    Activity Tolerance: Patient tolerated treatment well Patient left: in chair;with call bell/phone within reach;with chair alarm set  OT Visit Diagnosis: Unsteadiness on feet (R26.81)                Time: 6578-4696 OT Time Calculation (min): 28 min Charges:  OT General Charges $OT Visit: 1 Visit OT Evaluation $OT Eval Low Complexity: [place], OTR/L Acute Rehabilitation Services [phone] [facility] pager [phone] office 04/20/2018  [place] 04/20/2018, 11:25 AM

## 2018-04-21 ENCOUNTER — Encounter (HOSPITAL_COMMUNITY): Payer: Self-pay | Admitting: *Deleted

## 2018-04-21 LAB — CBC
HCT: 37.4 % — ABNORMAL LOW (ref 39.0–52.0)
Hemoglobin: 12.2 g/dL — ABNORMAL LOW (ref 13.0–17.0)
MCH: 33.2 pg (ref 26.0–34.0)
MCHC: 32.6 g/dL (ref 30.0–36.0)
MCV: 101.9 fL — ABNORMAL HIGH (ref 80.0–100.0)
Platelets: 41 10*3/uL — ABNORMAL LOW (ref 150–400)
RBC: 3.67 MIL/uL — ABNORMAL LOW (ref 4.22–5.81)
RDW: 17.4 % — ABNORMAL HIGH (ref 11.5–15.5)
WBC: 5.8 10*3/uL (ref 4.0–10.5)
nRBC: 0 % (ref 0.0–0.2)

## 2018-04-21 LAB — BASIC METABOLIC PANEL
Anion gap: 6 (ref 5–15)
BUN: 12 mg/dL (ref 8–23)
CO2: 23 mmol/L (ref 22–32)
Calcium: 8.1 mg/dL — ABNORMAL LOW (ref 8.9–10.3)
Chloride: 108 mmol/L (ref 98–111)
Creatinine, Ser: 0.82 mg/dL (ref 0.61–1.24)
GFR calc non Af Amer: >60 mL/min (ref >60)
Glucose, Bld: 183 mg/dL — ABNORMAL HIGH (ref 70–99)
Potassium: 4.2 mmol/L (ref 3.5–5.1)
SODIUM: 137 mmol/L (ref 135–145)

## 2018-04-21 LAB — MAGNESIUM: Magnesium: 2.4 mg/dL (ref 1.7–2.4)

## 2018-04-21 LAB — GLUCOSE, CAPILLARY: Glucose-Capillary: 161 mg/dL — ABNORMAL HIGH (ref 70–99)

## 2018-04-21 LAB — PHOSPHORUS: Phosphorus: 3.1 mg/dL (ref 2.5–4.6)

## 2018-04-21 MED ORDER — BENZONATATE 100 MG PO CAPS
200.0000 mg | ORAL_CAPSULE | Freq: Two times a day (BID) | ORAL | Status: DC
  Administered 2018-04-21: 200 mg via ORAL
  Filled 2018-04-21: qty 2

## 2018-04-21 MED ORDER — HEPARIN SOD (PORK) LOCK FLUSH 100 UNIT/ML IV SOLN
500.0000 [IU] | INTRAVENOUS | Status: AC | PRN
  Administered 2018-04-21: 500 [IU]

## 2018-04-21 MED ORDER — SODIUM CHLORIDE 0.9% FLUSH
10.0000 mL | INTRAVENOUS | Status: DC | PRN
  Administered 2018-04-21: 10 mL
  Filled 2018-04-21: qty 40

## 2018-04-21 MED ORDER — ALBUTEROL SULFATE (2.5 MG/3ML) 0.083% IN NEBU: 2.5000 mg | INHALATION_SOLUTION | Freq: Two times a day (BID) | RESPIRATORY_TRACT | 0 refills | Status: DC | PRN

## 2018-04-21 NOTE — Discharge Summary (Addendum)
Discharge Summary  [name]:[id number] DOB: 1950/04/16  PCP: [name]., PA-C  Admit date: 04/17/2018 Discharge date: 04/21/2018  Time spent: 35 minutes  Recommendations for Outpatient Follow-up:  1. Follow-up with your oncologist next week, keep your appointment. 2. Follow-up with your PCP 3. Take your medications as prescribed 4. Continue physical therapy at home 5. Fall precautions  Discharge Diagnoses:  Active Hospital Problems   Diagnosis Date Noted  . Influenza A 04/17/2018  . Sepsis ([place]) 04/17/2018  . Chronic diastolic CHF (congestive heart failure) ([place]) 04/17/2018  . COPD mixed type ([place]) 02/23/2017  . Anxiety 01/30/2014  . Thrombocytopenia ([place]) 01/30/2014  . Diabetes mellitus without complication ([place]) 63/87/5643  . CAD (coronary artery disease) 07/29/2011  . 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 GERD 09/27/2008    Resolved Hospital Problems  No resolved problems to display.    Discharge Condition: Stable  Diet recommendation: Resume previous diet  Vitals:   04/20/18 2101 04/21/18 0607  BP: 131/72 (!) 150/69  Pulse: 71 80  Resp: 20 18  Temp: 98.1 F (36.7 C) 97.8 F (36.6 C)  SpO2: 93% 95%    History of present illness:  Ryan Copeland a 68 y.o.malewith medical history significant ofsmall B-cell lymphoma (just restarted Calquence yesterday), hyperlipidemia, diabetes mellitus, COPD, GERD, gout, anxiety, CAD, PVD, former smoker,dCHF, thrombocytopenia, who presents with fever, chills, body aches and runny nose. Influenza A PCR positive. [facility] asked to admit for treatment.  04/20/18: Improving, still hypoxic with ambulation.  Saturation on room air while ambulating 87%.    04/21/18: Patient seen and examined at his bedside.  No acute events overnight.  Blood cultures drawn on 04/17/2018 revealed gram-positive rods 1 out of 2 bottles, likely contaminant.  Patient is afebrile with no leukocytosis.   States he feels well and is ready to go home.  Vital signs and lab studies reviewed and are stable.  Patient advised to follow-up with his primary care provider and oncologist next week.  States he has an appointment with his oncologist next week and will keep the appointment.    On the day of discharge, the patient was hemodynamically stable.  He will need to follow-up with his primary care provider and oncologist posthospitalization.  Patient understands and agrees to plan.    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 ([place])   CAD (coronary artery disease)   Diabetes mellitus without complication (HCC)   Anxiety   Thrombocytopenia (HCC)   COPD mixed type (HCC)   Sepsis (HCC)   Chronic diastolic CHF (congestive heart failure) ([place])  Resolving sepsis secondary to influenza A infection Sepsis physiology is resolving Presented with leukocytosis, fever, tachypnea, hypotension Influenza A PCR positive Responded well to IV fluids Continue nebs and antitussive  Resolving acute hypoxic respiratory failure secondary to influenza A infection Maintain O2 saturation greater than 92% Management as stated above Failed home O2 evaluation on 04/20/2018 Saturation 87% on room air while ambulating on 04/20/2018 Given 1 dose of IV Solu-Medrol 60 mg once on 04/20/2018 Restarted on his home dose of Lasix 20 mg daily on 04/20/2018 Better lungs aeration on exam this morning 04/21/2018 Follow-up with your PCP  Resolved hypotension secondary to sepsis Responded well to IV fluid DC IV fluid on 04/19/2018 Resume home p.o. Lasix 20 mg daily on 04/20/2018 Resumed home metoprolol succinate 12.5  daily Follow-up with your PCP  Resolved sinus tachycardia Continue home metoprolol 12.5 mg daily  Gram-positive rods bacteremia Blood cultures drawn on 04/17/2018 revealed gram-positive rods 1 out of 2 bottles Likely  contaminant Repeat blood cultures peripherally x2 on 04/21/2018 will be sent for analysis Not on antibiotics Afebrile with no leukocytosis Does not appear septic and states he feels well and ready to go home  Chronic diastolic CHF Appears euvolemic Continue home Lasix p.o. 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 -Defer to oncology to restart Calquence.  Follow up with Dr. Alvy Bimler next week.  CAD (coronary artery disease): -S/p of stent. -Denies anginal symptoms -continue crestor  Type 2 diabetes with hypo-glycemia -Last A1c6.2 on 04/13/18 -Patient is takingmetforminat home, resume  Chronic anxiety: -Stable -Continue home Xanax   Chronic back pain Resume home pain medications  Chronic thrombocytopenia ([place]); Platelets improving 41K from 36K yesterday No sign of overt bleeding Follow-up with your oncologist next week  COPD mixed type [facility]): -Stable. -Continue bronchodilators   Code Status:Full code  Consults called:None    Discharge Exam: BP (!) 150/69 (BP Location: Left Arm)   Pulse 80   Temp 97.8 F (36.6 C) (Oral)   Resp 18   Ht 6' (1.829 m)   Wt 99 kg   SpO2 95%   BMI 29.60 kg/m  . General: 68 y.o. year-old male well developed well nourished in no acute distress.  Alert and oriented x3. . Cardiovascular: Regular rate and rhythm with no rubs or gallops.  No thyromegaly or JVD noted.   Marland Kitchen Respiratory: Clear to auscultation with no wheezes or rales. Good inspiratory effort. . Abdomen: Soft nontender nondistended with normal bowel sounds x4 quadrants. . Musculoskeletal: No lower extremity edema. 2/4 pulses in all 4 extremities. . Skin: No ulcerative lesions noted or rashes, . Psychiatry: Mood is appropriate for condition and setting  Discharge  Instructions You were cared for by a hospitalist during your hospital stay. If you have any questions about your discharge medications or the care you received while you were in the hospital after you are discharged, you can call the unit and asked to speak with the hospitalist on call if the hospitalist that took care of you is not available. Once you are discharged, your primary care physician will handle any further medical issues. Please note that NO REFILLS for any discharge medications will be authorized once you are discharged, as it is imperative that you return to your primary care physician (or establish a relationship with a primary care physician if you do not have one) for your aftercare needs so that they can reassess your need for medications and monitor your lab values.   Allergies as of 04/21/2018      Reactions   Codeine Hives   Pt states he can take a few, more reaction with extended doses.      Medication List    STOP taking these medications   Acalabrutinib 100 MG Caps Commonly known as:  Calquence   acetaminophen 500 MG tablet Commonly known as:  TYLENOL   famotidine 20 MG tablet Commonly known as:  PEPCID   loperamide 2 MG capsule Commonly known as:  IMODIUM     TAKE these medications   acyclovir 400 MG tablet Commonly known as:  ZOVIRAX Take 1  tablet (400 mg total) by mouth 2 (two) times daily.   allopurinol 300 MG tablet Commonly known as:  ZYLOPRIM Take 1 tablet (300 mg total) by mouth daily.   ALPRAZolam 0.25 MG tablet Commonly known as:  XANAX Take 0.25 mg by mouth as needed for anxiety.   b complex vitamins tablet Take 1 tablet by mouth daily.   benzonatate 100 MG capsule Commonly known as:  TESSALON Take 2 capsules (200 mg total) by mouth 2 (two) times daily as needed for cough. What changed:    how much to take  when to take this   fluticasone 50 MCG/ACT nasal spray Commonly known as:  FLONASE Place 2 sprays into the nose daily.     furosemide 20 MG tablet Commonly known as:  LASIX Take 1 tablet (20 mg total) by mouth daily as needed.   HYDROcodone-acetaminophen 10-325 MG tablet Commonly known as:  NORCO Take 1 tablet by mouth See admin instructions. Every 6 to 8 hours as needed for pain   lidocaine-prilocaine cream Commonly known as:  EMLA APPLY TO AFFECTED AREA ONCE What changed:  See the new instructions.   meclizine 12.5 MG tablet Commonly known as:  ANTIVERT Take 12.5 mg by mouth every 6 (six) hours as needed for dizziness.   metFORMIN 500 MG 24 hr tablet Commonly known as:  GLUCOPHAGE-XR Take 500 mg by mouth See admin instructions. Takes one tablet in AM and 2 tablets at night.   metoprolol succinate 25 MG 24 hr tablet Commonly known as:  TOPROL-XL Take 0.5 tablets (12.5 mg total) by mouth daily. What changed:  how much to take   nitroGLYCERIN 0.4 MG SL tablet Commonly known as:  NITROSTAT PLACE 1 TABLET UNDER THE TONGUE EVERY 5 MINUTES X 3 DOSES AS NEEDED FOR CHEST PAIN *MAX 3 DOSES* What changed:  See the new instructions.   nortriptyline 25 MG capsule Commonly known as:  PAMELOR TAKE 4 CAPSULES BY MOUTH AT BEDTIME. What changed:  See the new instructions.   oseltamivir 75 MG capsule Commonly known as:  TAMIFLU Take 1 capsule (75 mg total) by mouth 2 (two) times daily for 2 days.   pregabalin 50 MG capsule Commonly known as:  LYRICA Take 1 capsule (50 mg total) by mouth 3 (three) times daily with meals. What changed:    medication strength  how much to take  how to take this  when to take this  additional instructions   Proventil HFA 108 (90 Base) MCG/ACT inhaler Generic drug:  albuterol TAKE 2 PUFFS BY MOUTH EVERY 6 HOURS AS NEEDED FOR WHEEZE OR SHORTNESS OF BREATH What changed:  See the new instructions.   albuterol (2.5 MG/3ML) 0.083% nebulizer solution Commonly known as:  PROVENTIL Take 3 mLs (2.5 mg total) by nebulization 2 (two) times daily as needed for wheezing or  shortness of breath. What changed:  You were already taking a medication with the same name, and this prescription was added. Make sure you understand how and when to take each.   rosuvastatin 5 MG tablet Commonly known as:  CRESTOR TAKE 1 TABLET (5 MG TOTAL) BY MOUTH DAILY.   sildenafil 100 MG tablet Commonly known as:  VIAGRA TAKE 1/2-1 TABLET DAILY AS NEEDED FOR ERECTILE DYSFUNCTION (30 MINUTES PRIOR TO SEXUAL INTERCOURSE) What changed:  See the new instructions.   Vitamin D-1000 Max St 25 MCG (1000 UT) tablet Generic drug:  Cholecalciferol Take 1,000 Units by mouth daily.  Durable Medical Equipment  (From admission, onward)         Start     Ordered   04/21/18 0748  For home use only DME Nebulizer machine  Once    Question:  Patient needs a nebulizer to treat with the following condition  Answer:  Influenza A   04/21/18 0747   04/21/18 0457  For home use only DME Pulse oximeter  Once     04/21/18 0456   04/21/18 0456  For home use only DME oxygen  Once    Question Answer Comment  Mode or (Route) Nasal cannula   Liters per Minute 2   Frequency Continuous (stationary and portable oxygen unit needed)   Oxygen conserving device Yes   Oxygen delivery system Gas      04/21/18 0456         Allergies  Allergen Reactions  . Codeine Hives    Pt states he can take a few, more reaction with extended doses.   Follow-up Information    [name]., PA-C. Call in 1 day(s).   Specialty:  Family Medicine Why:  Please call for a post hospital follow up appointment Contact information: [address] [place] [address] [phone]        [name], MD. Call in 1 day(s).   Specialty:  Hematology and Oncology Why:  please call for a post hospital follow up appointment Contact information: [place] [address] [phone]            The results of significant diagnostics from this hospitalization  (including imaging, microbiology, ancillary and laboratory) are listed below for reference.    Significant Diagnostic Studies: Ct Chest W Contrast  Result Date: 04/06/2018 CLINICAL DATA:  Follow-up CLL EXAM: CT CHEST, ABDOMEN, AND PELVIS WITH CONTRAST TECHNIQUE: Multidetector CT imaging of the chest, abdomen and pelvis was performed following the standard protocol during bolus administration of intravenous contrast. CONTRAST:  165mL OMNIPAQUE IOHEXOL 300 MG/ML  SOLN COMPARISON:  PET-CT, 03/22/2017, CT chest abdomen pelvis, 09/15/2016 FINDINGS: CT CHEST FINDINGS Cardiovascular: Coronary artery calcifications and LAD stent. Normal heart size. No pericardial effusion. Right internal jugular port catheter. Mediastinum/Nodes: There are numerous prominent bilateral axillary and mediastinal the the lymph nodes, which are increased in size and conspicuity compared to prior PET-CT dated 03/22/2017 (series 2, image 15, compared with prior exam series 4, image 58). The largest nodes measure approximately 1.2 cm. The thyroid gland, trachea, and esophagus demonstrate no significant findings. Lungs/Pleura: Minimal emphysema. Bibasilar scarring or atelectasis. No pleural effusion or pneumothorax. Musculoskeletal: No chest wall mass or suspicious bone lesions identified. CT ABDOMEN PELVIS FINDINGS Hepatobiliary: No focal liver abnormality is seen. Status post cholecystectomy. No biliary dilatation. Pancreas: Unremarkable. No pancreatic ductal dilatation or surrounding inflammatory changes. Spleen: Gross splenomegaly, maximum coronal span approximately 23.7 cm, previously 17.4 cm. Adrenals/Urinary Tract: Adrenal glands are unremarkable. Kidneys are normal, without renal calculi, focal lesion, or hydronephrosis. Bladder is unremarkable. Stomach/Bowel: Stomach is within normal limits. Appendix appears normal. No evidence of bowel wall thickening, distention, or inflammatory changes. Vascular/Lymphatic: Severe calcific  atherosclerosis. Left common iliac artery stent. Numerous enlarged retroperitoneal lymph nodes, increased in size compared to prior examination (series 2, image 76). The largest left para-aortic lymph node conglomerate measures approximately 3.4 cm, previously 1.9 cm. Reproductive: No mass or other abnormality. Other: No abdominal wall hernia or abnormality. No abdominopelvic ascites. Musculoskeletal: No acute or significant osseous findings. IMPRESSION: 1. Interval enlargement of axillary, mediastinal, and retroperitoneal  lymph nodes, as well as increased splenomegaly, findings concerning for progression of CLL in comparison to prior PET-CT dated 03/22/2017. 2.  Other chronic and incidental findings as detailed above. Electronically Signed   By: Eddie Candle M.D.   On: 04/06/2018 09:13   Ct Abdomen Pelvis W Contrast  Result Date: 04/06/2018 CLINICAL DATA:  Follow-up CLL EXAM: CT CHEST, ABDOMEN, AND PELVIS WITH CONTRAST TECHNIQUE: Multidetector CT imaging of the chest, abdomen and pelvis was performed following the standard protocol during bolus administration of intravenous contrast. CONTRAST:  126mL OMNIPAQUE IOHEXOL 300 MG/ML  SOLN COMPARISON:  PET-CT, 03/22/2017, CT chest abdomen pelvis, 09/15/2016 FINDINGS: CT CHEST FINDINGS Cardiovascular: Coronary artery calcifications and LAD stent. Normal heart size. No pericardial effusion. Right internal jugular port catheter. Mediastinum/Nodes: There are numerous prominent bilateral axillary and mediastinal the the lymph nodes, which are increased in size and conspicuity compared to prior PET-CT dated 03/22/2017 (series 2, image 15, compared with prior exam series 4, image 58). The largest nodes measure approximately 1.2 cm. The thyroid gland, trachea, and esophagus demonstrate no significant findings. Lungs/Pleura: Minimal emphysema. Bibasilar scarring or atelectasis. No pleural effusion or pneumothorax. Musculoskeletal: No chest wall mass or suspicious bone lesions  identified. CT ABDOMEN PELVIS FINDINGS Hepatobiliary: No focal liver abnormality is seen. Status post cholecystectomy. No biliary dilatation. Pancreas: Unremarkable. No pancreatic ductal dilatation or surrounding inflammatory changes. Spleen: Gross splenomegaly, maximum coronal span approximately 23.7 cm, previously 17.4 cm. Adrenals/Urinary Tract: Adrenal glands are unremarkable. Kidneys are normal, without renal calculi, focal lesion, or hydronephrosis. Bladder is unremarkable. Stomach/Bowel: Stomach is within normal limits. Appendix appears normal. No evidence of bowel wall thickening, distention, or inflammatory changes. Vascular/Lymphatic: Severe calcific atherosclerosis. Left common iliac artery stent. Numerous enlarged retroperitoneal lymph nodes, increased in size compared to prior examination (series 2, image 76). The largest left para-aortic lymph node conglomerate measures approximately 3.4 cm, previously 1.9 cm. Reproductive: No mass or other abnormality. Other: No abdominal wall hernia or abnormality. No abdominopelvic ascites. Musculoskeletal: No acute or significant osseous findings. IMPRESSION: 1. Interval enlargement of axillary, mediastinal, and retroperitoneal lymph nodes, as well as increased splenomegaly, findings concerning for progression of CLL in comparison to prior PET-CT dated 03/22/2017. 2.  Other chronic and incidental findings as detailed above. Electronically Signed   By: Eddie Candle M.D.   On: 04/06/2018 09:13   Dg Chest Port 1 View  Result Date: 04/17/2018 CLINICAL DATA:  Fever. Weakness. EXAM: PORTABLE CHEST 1 VIEW COMPARISON:  Chest CT 04/06/2018 FINDINGS: Tip of the right chest port in the mid SVC. Upper normal heart size with unchanged mediastinal contours. Aortic atherosclerosis. No focal airspace disease. No pulmonary edema, pneumothorax, or large pleural effusion. No acute osseous abnormalities are seen. IMPRESSION: No acute chest findings. Electronically Signed   By:  Keith Rake M.D.   On: 04/17/2018 21:23    Microbiology: Recent Results (from the past 240 hour(s))  Blood Culture (routine x 2)     Status: None (Preliminary result)   Collection Time: 04/17/18  9:20 PM  Result Value Ref Range Status   Specimen Description   Final    BLOOD LEFT WRIST Performed at [facility], [place] [address] [place], [place] [address]    Special Requests   Final    BOTTLES DRAWN AEROBIC AND ANAEROBIC Blood Culture adequate volume Performed at [place] [address], [place], [place] [address]    Culture   Final    NO GROWTH 3 DAYS Performed at [facility], [place] [address], [place], [place] [address]    Report Status PENDING  Incomplete  Blood Culture (routine x 2)     Status: None (Preliminary result)   Collection Time: 04/17/18  9:47 PM  Result Value Ref Range Status   Specimen Description   Final    BLOOD SITE NOT SPECIFIED Performed at [facility], [place] [address] [place], [place] [address]    Special Requests   Final    BOTTLES DRAWN AEROBIC AND ANAEROBIC Blood Culture adequate volume Performed at [facility] [address], [place], [place] [address]    Culture  Setup Time   Final    GRAM POSITIVE RODS AEROBIC BOTTLE ONLY CRITICAL RESULT CALLED TO, READ BACK BY AND VERIFIED WITH: PHARMD B GREEN [id number] AT 638 AM BY CM Performed at [facility], [place] [address], [place], [place] [address]    Culture GRAM POSITIVE RODS  Final   Report Status PENDING  Incomplete  Urine culture     Status: None   Collection Time: 04/18/18  1:00 AM  Result Value Ref Range Status   Specimen Description   Final    URINE, CLEAN CATCH Performed at [facility], [place] [address], [place], [place] [address]    Special Requests   Final    NONE Performed at [facility], [place] [address], [place], [place] [address]    Culture   Final    NO  GROWTH Performed at [facility], [place] [address] [place], [place] [address]    Report Status 04/19/2018 FINAL  Final  MRSA PCR Screening     Status: None   Collection Time: 04/18/18 12:26 PM  Result Value Ref Range Status   MRSA by PCR NEGATIVE NEGATIVE Final    Comment:        The GeneXpert MRSA Assay (FDA approved for NASAL specimens only), is one component of a comprehensive MRSA colonization surveillance program. It is not intended to diagnose MRSA infection nor to guide or monitor treatment for MRSA infections. Performed at [facility], [place] [address], [place], [place] [address]      Labs: Basic Metabolic Panel: Recent Labs  Lab 04/17/18 2139 04/18/18 1124 04/20/18 0509 04/21/18 0532  NA 135 136 137 137  K 4.3 3.9 3.3* 4.2  CL 105 108 108 108  CO2 23 21* 23 23  GLUCOSE 125* 105* 78 183*  BUN 14 13 12 12   CREATININE 1.11 1.08 0.92 0.82  CALCIUM 8.3* 7.3* 7.8* 8.1*  MG  --   --   --  2.4  PHOS  --   --   --  3.1   Liver Function Tests: Recent Labs  Lab 04/17/18 2139  AST 17  ALT 10  ALKPHOS 72  BILITOT 1.1  PROT 6.2*  ALBUMIN 3.6   No results for input(s): LIPASE, AMYLASE in the last 168 hours. No results for input(s): AMMONIA in the last 168 hours. CBC: Recent Labs  Lab 04/17/18 2139 04/18/18 1124 04/19/18 0646 04/20/18 0509 04/21/18 0532  WBC 17.5* 10.2 8.4 6.9 5.8  NEUTROABS 4.9  --  2.1  --   --   HGB 13.4 11.4* 12.2* 10.3* 12.2*  HCT 41.6 35.1* 37.1* 32.1* 37.4*  MCV 104.8* 104.8* 103.6* 105.2* 101.9*  PLT 62* 44* 40* 36* 41*   Cardiac Enzymes: No results for input(s): CKTOTAL, CKMB, CKMBINDEX, TROPONINI in the last 168 hours. BNP:  BNP (last 3 results) Recent Labs    04/18/18 0315  BNP 54.0    ProBNP (last 3 results) No results for input(s): PROBNP in the last 8760 hours.  CBG: Recent Labs  Lab 04/19/18 1135 04/19/18 1700 04/19/18 2131 04/20/18 0729 04/20/18 1129  GLUCAP 90 80 80 76 81        Signed:  Kayleen Memos, MD [facility] 04/21/2018, 7:51 AM

## 2018-04-21 NOTE — Plan of Care (Signed)
Plan of care reviewed and discussed with the patient. 

## 2018-04-21 NOTE — Progress Notes (Signed)
PHARMACY - PHYSICIAN COMMUNICATION CRITICAL VALUE ALERT - BLOOD CULTURE IDENTIFICATION (BCID)  Ryan Copeland is an 68 y.o. male who presented to [facility] on 04/17/2018 with a chief complaint of flu, sepsis, respiratory failure  Assessment:  respiratory (include suspected source if known) 1 of 4 bottle : Gm + Rods, WBC WNL, afebrile Name of physician (or Provider) Contacted: X. Blount, NP  Current antibiotics: none  Changes to prescribed antibiotics recommended:  None- likely contaminant  No results found for this or any previous visit.  Dorrene German 04/21/2018  6:39 AM

## 2018-04-21 NOTE — Progress Notes (Signed)
.  SATURATION QUALIFICATIONS: (This note is used to comply with regulatory documentation for home oxygen)  Patient Saturations on Room Air at Rest = 98%  Patient Saturations on Room Air while Ambulating = 96%   Please briefly explain why patient needs home oxygen: patient does not need home O2, he is maintaining sats above 92% on RA while ambulating .  Barbee [place]. Brigitte Pulse, RN

## 2018-04-21 NOTE — Care Management Note (Signed)
Case Management Note  Patient Details  Name: Ryan Copeland MRN: [id number] Date of Birth: 1950-10-01  Subjective/Objective:  Pt admitted with influenza A, sepsis, resp failure                   Action/Plan: Spoke to pt via phone. States he works and declines [place]. Contacted [employer] for neb machine for home to be delivered to room prior to dc.   Expected Discharge Date:  04/21/18               Expected Discharge Plan:  Home/Self Care  In-House Referral:  NA  Discharge planning Services  CM Consult  Post Acute Care Choice:  Home Health Choice offered to:  Patient  DME Arranged:  Nebulizer machine DME Agency:  [employer]  HH Arranged:  Patient Refused [place] Agency:  NA  Status of Service:  Completed, signed off  If discussed at Long Length of Stay Meetings, dates discussed:    Additional Comments:  Erenest Rasher, RN 04/21/2018, 12:31 PM

## 2018-04-21 NOTE — Progress Notes (Addendum)
Reviewed discharge information with patient and wife. Answered all questions. Patient/caregiver able to teach back medications and reasons to contact MD/911. Patient verbalizes importance of PCP follow up appointment.  Home nebulizer delivered to room. Patient refuses home health PT.  Patient doesn't qualify for home O2 d/t walking O2 sat 96% on room air.   Patient agitated and yells about medication changes to his metoprolol and Lyrica. RN attempted to explain clinical reasoning behind medication dose decrease but patient continually yells, curses, and talks over nurse not listening to explanations. RN attempted to facilitate therapeutic conversation and listened to patients concerns. Patient states he wants to speak with Dr. Nevada Crane but then demands to be discharged and taken out in wheelchair. RN spoke with Dr. Nevada Crane and again tried to explain clinical reasoning behind dose changes and patient continued to curse and talk over nurse. Patient states he will follow up with his PCP on Monday.   Barbee Shropshire. Brigitte Pulse, RN

## 2018-04-22 LAB — CULTURE, BLOOD (ROUTINE X 2)
Culture: NO GROWTH
Special Requests: ADEQUATE

## 2018-04-23 ENCOUNTER — Encounter: Payer: Self-pay | Admitting: Hematology and Oncology

## 2018-04-23 LAB — CULTURE, BLOOD (ROUTINE X 2): Special Requests: ADEQUATE

## 2018-04-24 ENCOUNTER — Telehealth: Payer: Self-pay | Admitting: Hematology and Oncology

## 2018-04-24 ENCOUNTER — Encounter: Payer: Self-pay | Admitting: Hematology and Oncology

## 2018-04-24 ENCOUNTER — Inpatient Hospital Stay: Payer: Medicare Other | Attending: Hematology and Oncology | Admitting: Hematology and Oncology

## 2018-04-24 ENCOUNTER — Inpatient Hospital Stay: Payer: Medicare Other

## 2018-04-24 DIAGNOSIS — I10 Essential (primary) hypertension: Secondary | ICD-10-CM | POA: Diagnosis not present

## 2018-04-24 DIAGNOSIS — Z79899 Other long term (current) drug therapy: Secondary | ICD-10-CM | POA: Diagnosis not present

## 2018-04-24 DIAGNOSIS — Z9221 Personal history of antineoplastic chemotherapy: Secondary | ICD-10-CM | POA: Insufficient documentation

## 2018-04-24 DIAGNOSIS — C8308 Small cell B-cell lymphoma, lymph nodes of multiple sites: Secondary | ICD-10-CM

## 2018-04-24 DIAGNOSIS — D61818 Other pancytopenia: Secondary | ICD-10-CM | POA: Diagnosis not present

## 2018-04-24 LAB — CMP (CANCER CENTER ONLY)
ALT: 10 U/L (ref 0–44)
AST: 13 U/L — ABNORMAL LOW (ref 15–41)
Albumin: 3.4 g/dL — ABNORMAL LOW (ref 3.5–5.0)
Alkaline Phosphatase: 81 U/L (ref 38–126)
Anion gap: 8 (ref 5–15)
BUN: 9 mg/dL (ref 8–23)
CO2: 27 mmol/L (ref 22–32)
Calcium: 8.7 mg/dL — ABNORMAL LOW (ref 8.9–10.3)
Chloride: 104 mmol/L (ref 98–111)
Creatinine: 1.04 mg/dL (ref 0.61–1.24)
GFR, Est AFR Am: >60 mL/min (ref >60)
GFR, Estimated: >60 mL/min (ref >60)
Glucose, Bld: 170 mg/dL — ABNORMAL HIGH (ref 70–99)
Potassium: 4.5 mmol/L (ref 3.5–5.1)
Sodium: 139 mmol/L (ref 135–145)
Total Bilirubin: 0.7 mg/dL (ref 0.3–1.2)
Total Protein: 6.2 g/dL — ABNORMAL LOW (ref 6.5–8.1)

## 2018-04-24 LAB — GLUCOSE, CAPILLARY
Glucose-Capillary: 66 mg/dL — ABNORMAL LOW (ref 70–99)
Glucose-Capillary: 88 mg/dL (ref 70–99)
Glucose-Capillary: 155 mg/dL — ABNORMAL HIGH (ref 70–99)

## 2018-04-24 LAB — CBC WITH DIFFERENTIAL/PLATELET
Abs Immature Granulocytes: 0.13 10*3/uL — ABNORMAL HIGH (ref 0.00–0.07)
BASOS PCT: 0 %
Basophils Absolute: 0.1 10*3/uL (ref 0.0–0.1)
Eosinophils Absolute: 0.2 10*3/uL (ref 0.0–0.5)
Eosinophils Relative: 1 %
HEMATOCRIT: 38 % — AB (ref 39.0–52.0)
Hemoglobin: 12.7 g/dL — ABNORMAL LOW (ref 13.0–17.0)
Immature Granulocytes: 1 %
Lymphocytes Relative: 80 %
Lymphs Abs: 19.6 10*3/uL — ABNORMAL HIGH (ref 0.7–4.0)
MCH: 33.4 pg (ref 26.0–34.0)
MCHC: 33.4 g/dL (ref 30.0–36.0)
MCV: 100 fL (ref 80.0–100.0)
MONOS PCT: 4 %
Monocytes Absolute: 0.9 10*3/uL (ref 0.1–1.0)
Neutro Abs: 3.4 10*3/uL (ref 1.7–7.7)
Neutrophils Relative %: 14 %
PLATELETS: 61 10*3/uL — AB (ref 150–400)
RBC: 3.8 MIL/uL — AB (ref 4.22–5.81)
RDW: 17.5 % — ABNORMAL HIGH (ref 11.5–15.5)
WBC: 24.3 10*3/uL — ABNORMAL HIGH (ref 4.0–10.5)
nRBC: 0 % (ref 0.0–0.2)

## 2018-04-24 LAB — URIC ACID: Uric Acid, Serum: 3.9 mg/dL (ref 3.7–8.6)

## 2018-04-24 LAB — LACTATE DEHYDROGENASE: LDH: 241 U/L — ABNORMAL HIGH (ref 98–192)

## 2018-04-24 NOTE — Telephone Encounter (Signed)
Gave avs and calendar ° °

## 2018-04-24 NOTE — Assessment & Plan Note (Signed)
His blood pressure is elevated He has mild fluid retention and is taking diuretic therapy He will resume his home medications

## 2018-04-24 NOTE — Progress Notes (Signed)
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  He agreed with the plan of care  Pancytopenia, acquired [facility]) He has persistent pancytopenia due to side effects of treatment and chronic splenomegaly He is not symptomatic.  Observe only for now.  Essential hypertension His blood pressure is elevated He has mild fluid retention and is taking diuretic therapy He will resume his home medications    No orders of the defined types were placed in this encounter.   INTERVAL HISTORY: Please see below for problem oriented charting. He returns for further follow-up He was recently hospitalized for influenza He is recovered well Denies cough, chest pain or shortness of breath He has resumed taking chemotherapy 2 days ago He complained of mild bilateral lower extremity edema He bruises easily The patient denies any recent signs or symptoms of bleeding such as spontaneous epistaxis, hematuria or hematochezia.   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  No evidence of aortic dissection or other acute process in the chest. 2.  Centrilobular emphysema with a 5 mm right lung nodule. Given the concurrent centrilobular emphysema, follow-up  chest CT at 6 -12 months is recommended.  3.  Coronary artery atherosclerosis which is age advanced. 4.  Prominent thoracic lymph nodes.  These can be reevaluated at follow-up.    05/06/2010 Imaging    1.  Multiple small periaortic lymph nodes consistent with the patient's history of the chronic lymphocytic leukemia. 2.  No evidence of solid organ involvement    11/03/2011 Imaging    1.  Interval progression of abdominal and pelvic adenopathy. 2.  Progression of splenomegaly.  The spleen now measures 23 cm in length    11/18/2011 Bone Marrow Biopsy    Bone Marrow, Aspirate,Biopsy, and Clot, right iliac bone - HYPERCELLULAR BONE MARROW WITH EXTENSIVE INVOLVEMENT BY CHRONIC LYMPHOCYTIC LEUKEMIA. PERIPHERAL BLOOD: - CHRONIC LYMPHOCYTIC LEUKEMIA    03/08/2012 Imaging    1.  Progressive increase in retroperitoneal, iliac, and inguinal lymphadenopathy. 2.  Interval increase in massive splenomegaly.     06/01/2012 Procedure    Placement of single lumen port a cath via right internal jugular vein.  The catheter tip lies at the cavoatrial junction.  A power injectable port a cath was placed and is ready for immediate use    06/20/2012 - 11/23/2012 Chemotherapy    He received FCR x 6 cycles    12/19/2012 Imaging    Left common iliac stent. Abdominal vasculature remains patent. Improving supraclavicular and axillary lymphadenopathy. Residual right subpectoral nodes measure up to 10 mm short axis. Improving retroperitoneal lymphadenopathy, measuring up to 16 mm short axis. Improving splenomegaly, measuring 18.7 cm.     01/20/2016 Imaging    1. Stable exam.  No  new or progressive findings. 2. No CT findings to explain odynophagia    09/02/2016 Pathology Results    The findings are consistent with involvement by previously known chronic lymphocytic leukemia    09/02/2016 Pathology Results    FISH for CLL came back positive for deletion 13q    09/15/2016 Imaging    1. Borderline enlarged abdominal  and pelvic lymph nodes. Compared with 11/03/2011 these are decreased in size as detailed above. 2. Persistent splenomegaly. 3. Aortic Atherosclerosis (ICD10-I70.0). LAD coronary artery calcification noted.    10/06/2016 - 02/24/2017 Chemotherapy    He received Bendamustine and Rituxan    11/03/2016 Adverse Reaction    Dose of Bendamustine is reduced due to severe pancytopenia    12/28/2016 Imaging    1. Borderline enlarged abdominal peritoneal ligament and abdominal retroperitoneal lymph nodes, stable. 2. Splenomegaly. 3.  Aortic atherosclerosis (ICD10-170.0).    03/22/2017 PET scan    1. No hypermetabolic adenopathy identified within the neck, chest, abdomen or pelvis. 2. Prominent left retroperitoneal node measures 1.6 cm without significant FDG uptake. 3. Splenomegaly. 4. Aortic Atherosclerosis (ICD10-I70.0) and Emphysema (ICD10-J43.9). LAD and left circumflex atherosclerotic calcifications noted.    02/02/2018 Procedure    IMPRESSION: Widely patent right IJ power port catheter.    04/06/2018 Imaging    1. Interval enlargement of axillary, mediastinal, and retroperitoneal lymph nodes, as well as increased splenomegaly, findings concerning for progression of CLL in comparison to prior PET-CT dated 03/22/2017.  2.  Other chronic and incidental findings as detailed above.     04/17/2018 - 04/21/2018 Hospital Admission    He was hospitalized for influenza     REVIEW OF SYSTEMS:   Constitutional: Denies fevers, chills or abnormal weight loss Eyes: Denies blurriness of vision Ears, nose, mouth, throat, and face: Denies mucositis or sore throat Respiratory: Denies cough, dyspnea or wheezes Cardiovascular: Denies palpitation, chest discomfort or lower extremity swelling Gastrointestinal:  Denies nausea, heartburn or change in bowel habits Skin: Denies abnormal skin rashes Lymphatics: Denies new lymphadenopathy  Neurological:Denies numbness, tingling or new  weaknesses Behavioral/Psych: Mood is stable, no new changes  All other systems were reviewed with the patient and are negative.  I have reviewed the past medical history, past surgical history, social history and family history with the patient and they are unchanged from previous note.  ALLERGIES:  is allergic to codeine.  MEDICATIONS:  Current Outpatient Medications  Medication Sig Dispense Refill  . Acalabrutinib 100 MG CAPS Take 100 mg by mouth 2 (two) times daily.    Marland Kitchen acyclovir (ZOVIRAX) 400 MG tablet Take 1 tablet (400 mg total) by mouth 2 (two) times daily. 180 tablet 1  . albuterol (PROVENTIL) (2.5 MG/3ML) 0.083% nebulizer solution Take 3 mLs (2.5 mg total) by nebulization 2 (two) times daily as needed for wheezing or shortness of breath. 75 mL 0  . allopurinol (ZYLOPRIM) 300 MG tablet Take 1 tablet (300 mg total) by mouth daily. 30 tablet 5  . ALPRAZolam (XANAX) 0.25 MG tablet Take 0.25 mg by mouth as needed for anxiety.    Marland Kitchen b complex vitamins tablet Take 1 tablet by mouth daily.     . benzonatate (TESSALON) 100 MG capsule Take 2 capsules (200 mg total) by mouth 2 (two) times daily as needed for cough. 20 capsule 0  . Cholecalciferol (VITAMIN D-1000 MAX ST) 1000 units tablet Take 1,000 Units by mouth daily.     . fluticasone (FLONASE) 50 MCG/ACT nasal spray Place 2 sprays into the nose daily.     Marland Kitchen  furosemide (LASIX) 20 MG tablet Take 1 tablet (20 mg total) by mouth daily as needed. 30 tablet 3  . HYDROcodone-acetaminophen (NORCO) 10-325 MG tablet Take 1 tablet by mouth See admin instructions. Every 6 to 8 hours as needed for pain  0  . lidocaine-prilocaine (EMLA) cream APPLY TO AFFECTED AREA ONCE (Patient taking differently: Apply 1 application topically once. ) 30 g 3  . meclizine (ANTIVERT) 12.5 MG tablet Take 12.5 mg by mouth every 6 (six) hours as needed for dizziness.   0  . metFORMIN (GLUCOPHAGE-XR) 500 MG 24 hr tablet Take 500 mg by mouth See admin instructions. Takes one  tablet in AM and 2 tablets at night.    . metoprolol succinate (TOPROL-XL) 25 MG 24 hr tablet Take 0.5 tablets (12.5 mg total) by mouth daily. 30 tablet 0  . nitroGLYCERIN (NITROSTAT) 0.4 MG SL tablet PLACE 1 TABLET UNDER THE TONGUE EVERY 5 MINUTES X 3 DOSES AS NEEDED FOR CHEST PAIN *MAX 3 DOSES* (Patient taking differently: Place 0.4 mg under the tongue every 5 (five) minutes as needed for chest pain. ) 25 tablet 3  . nortriptyline (PAMELOR) 25 MG capsule TAKE 4 CAPSULES BY MOUTH AT BEDTIME. (Patient taking differently: Take 100 mg by mouth at bedtime. ) 360 capsule 1  . pregabalin (LYRICA) 50 MG capsule Take 1 capsule (50 mg total) by mouth 3 (three) times daily with meals. 30 capsule 0  . PROVENTIL HFA 108 (90 Base) MCG/ACT inhaler TAKE 2 PUFFS BY MOUTH EVERY 6 HOURS AS NEEDED FOR WHEEZE OR SHORTNESS OF BREATH (Patient taking differently: Inhale 2 puffs into the lungs every 6 (six) hours as needed for wheezing or shortness of breath. ) 20.1 Inhaler 2  . rosuvastatin (CRESTOR) 5 MG tablet TAKE 1 TABLET (5 MG TOTAL) BY MOUTH DAILY. 90 tablet 2  . sildenafil (VIAGRA) 100 MG tablet TAKE 1/2-1 TABLET DAILY AS NEEDED FOR ERECTILE DYSFUNCTION (30 MINUTES PRIOR TO SEXUAL INTERCOURSE) (Patient taking differently: Take 50-100 mg by mouth as needed for erectile dysfunction. ) 10 tablet 6   No current facility-administered medications for this visit.    Facility-Administered Medications Ordered in Other Visits  Medication Dose Route Frequency Provider Last Rate Last Dose  . 0.9 %  sodium chloride infusion   Intravenous Continuous Alvy Bimler, , MD 50 mL/hr at 03/07/14 1005    . sodium chloride 0.9 % injection 10 mL  10 mL Intracatheter PRN Marcy Panning, MD   10 mL at 08/22/12 1721    PHYSICAL EXAMINATION: ECOG PERFORMANCE STATUS: 1 - Symptomatic but completely ambulatory  Vitals:   04/24/18 0835  BP: (!) 151/76  Pulse: 72  Resp: 19  Temp: 98.1 F (36.7 C)  SpO2: 100%   Filed Weights   04/24/18  0835  Weight: 218 lb 9.6 oz (99.2 kg)    GENERAL:alert, no distress and comfortable SKIN: skin color, texture, turgor are normal, no rashes or significant lesions.  Noted extensive skin bruises EYES: normal, Conjunctiva are pink and non-injected, sclera clear OROPHARYNX:no exudate, no erythema and lips, buccal mucosa, and tongue normal  NECK: supple, thyroid normal size, non-tender, without nodularity LYMPH:  no palpable lymphadenopathy in the cervical, axillary or inguinal LUNGS: clear to auscultation and percussion with normal breathing effort HEART: regular rate & rhythm and no murmurs with mild bilateral lower extremity edema ABDOMEN:abdomen soft, non-tender and normal bowel sounds Musculoskeletal:no cyanosis of digits and no clubbing  NEURO: alert & oriented x 3 with fluent speech, no focal motor/sensory deficits  LABORATORY DATA:  I have reviewed the data as listed    Component Value Date/Time   NA 137 04/21/2018 0532   NA 136 01/26/2017 0940   K 4.2 04/21/2018 0532   K 4.0 01/26/2017 0940   CL 108 04/21/2018 0532   CL 104 08/03/2012 1229   CO2 23 04/21/2018 0532   CO2 23 01/26/2017 0940   GLUCOSE 183 (H) 04/21/2018 0532   GLUCOSE 146 (H) 01/26/2017 0940   GLUCOSE 223 (H) 08/03/2012 1229   BUN 12 04/21/2018 0532   BUN 9.6 01/26/2017 0940   CREATININE 0.82 04/21/2018 0532   CREATININE 1.0 01/26/2017 0940   CALCIUM 8.1 (L) 04/21/2018 0532   CALCIUM 8.7 01/26/2017 0940   PROT 6.2 (L) 04/17/2018 2139   PROT 5.9 (L) 04/13/2017 0823   PROT 5.7 (L) 01/26/2017 0940   ALBUMIN 3.6 04/17/2018 2139   ALBUMIN 3.6 01/26/2017 0940   AST 17 04/17/2018 2139   AST 13 01/26/2017 0940   ALT 10 04/17/2018 2139   ALT 12 01/26/2017 0940   ALKPHOS 72 04/17/2018 2139   ALKPHOS 78 01/26/2017 0940   BILITOT 1.1 04/17/2018 2139   BILITOT 0.64 01/26/2017 0940   GFRNONAA >60 04/21/2018 0532   GFRAA >60 04/21/2018 0532    No results found for: SPEP, UPEP  Lab Results  Component Value  Date   WBC 24.3 (H) 04/24/2018   NEUTROABS PENDING 04/24/2018   HGB 12.7 (L) 04/24/2018   HCT 38.0 (L) 04/24/2018   MCV 100.0 04/24/2018   PLT 61 (L) 04/24/2018      Chemistry      Component Value Date/Time   NA 137 04/21/2018 0532   NA 136 01/26/2017 0940   K 4.2 04/21/2018 0532   K 4.0 01/26/2017 0940   CL 108 04/21/2018 0532   CL 104 08/03/2012 1229   CO2 23 04/21/2018 0532   CO2 23 01/26/2017 0940   BUN 12 04/21/2018 0532   BUN 9.6 01/26/2017 0940   CREATININE 0.82 04/21/2018 0532   CREATININE 1.0 01/26/2017 0940      Component Value Date/Time   CALCIUM 8.1 (L) 04/21/2018 0532   CALCIUM 8.7 01/26/2017 0940   ALKPHOS 72 04/17/2018 2139   ALKPHOS 78 01/26/2017 0940   AST 17 04/17/2018 2139   AST 13 01/26/2017 0940   ALT 10 04/17/2018 2139   ALT 12 01/26/2017 0940   BILITOT 1.1 04/17/2018 2139   BILITOT 0.64 01/26/2017 0940       RADIOGRAPHIC STUDIES: I have personally reviewed the radiological images as listed and agreed with the findings in the report. Ct Chest W Contrast  Result Date: 04/06/2018 CLINICAL DATA:  Follow-up CLL EXAM: CT CHEST, ABDOMEN, AND PELVIS WITH CONTRAST TECHNIQUE: Multidetector CT imaging of the chest, abdomen and pelvis was performed following the standard protocol during bolus administration of intravenous contrast. CONTRAST:  156m OMNIPAQUE IOHEXOL 300 MG/ML  SOLN COMPARISON:  PET-CT, 03/22/2017, CT chest abdomen pelvis, 09/15/2016 FINDINGS: CT CHEST FINDINGS Cardiovascular: Coronary artery calcifications and LAD stent. Normal heart size. No pericardial effusion. Right internal jugular port catheter. Mediastinum/Nodes: There are numerous prominent bilateral axillary and mediastinal the the lymph nodes, which are increased in size and conspicuity compared to prior PET-CT dated 03/22/2017 (series 2, image 15, compared with prior exam series 4, image 58). The largest nodes measure approximately 1.2 cm. The thyroid gland, trachea, and esophagus  demonstrate no significant findings. Lungs/Pleura: Minimal emphysema. Bibasilar scarring or atelectasis. No pleural effusion or pneumothorax. Musculoskeletal: No chest  wall mass or suspicious bone lesions identified. CT ABDOMEN PELVIS FINDINGS Hepatobiliary: No focal liver abnormality is seen. Status post cholecystectomy. No biliary dilatation. Pancreas: Unremarkable. No pancreatic ductal dilatation or surrounding inflammatory changes. Spleen: Gross splenomegaly, maximum coronal span approximately 23.7 cm, previously 17.4 cm. Adrenals/Urinary Tract: Adrenal glands are unremarkable. Kidneys are normal, without renal calculi, focal lesion, or hydronephrosis. Bladder is unremarkable. Stomach/Bowel: Stomach is within normal limits. Appendix appears normal. No evidence of bowel wall thickening, distention, or inflammatory changes. Vascular/Lymphatic: Severe calcific atherosclerosis. Left common iliac artery stent. Numerous enlarged retroperitoneal lymph nodes, increased in size compared to prior examination (series 2, image 76). The largest left para-aortic lymph node conglomerate measures approximately 3.4 cm, previously 1.9 cm. Reproductive: No mass or other abnormality. Other: No abdominal wall hernia or abnormality. No abdominopelvic ascites. Musculoskeletal: No acute or significant osseous findings. IMPRESSION: 1. Interval enlargement of axillary, mediastinal, and retroperitoneal lymph nodes, as well as increased splenomegaly, findings concerning for progression of CLL in comparison to prior PET-CT dated 03/22/2017. 2.  Other chronic and incidental findings as detailed above. Electronically Signed   By: Eddie Candle M.D.   On: 04/06/2018 09:13   Ct Abdomen Pelvis W Contrast  Result Date: 04/06/2018 CLINICAL DATA:  Follow-up CLL EXAM: CT CHEST, ABDOMEN, AND PELVIS WITH CONTRAST TECHNIQUE: Multidetector CT imaging of the chest, abdomen and pelvis was performed following the standard protocol during bolus  administration of intravenous contrast. CONTRAST:  159m OMNIPAQUE IOHEXOL 300 MG/ML  SOLN COMPARISON:  PET-CT, 03/22/2017, CT chest abdomen pelvis, 09/15/2016 FINDINGS: CT CHEST FINDINGS Cardiovascular: Coronary artery calcifications and LAD stent. Normal heart size. No pericardial effusion. Right internal jugular port catheter. Mediastinum/Nodes: There are numerous prominent bilateral axillary and mediastinal the the lymph nodes, which are increased in size and conspicuity compared to prior PET-CT dated 03/22/2017 (series 2, image 15, compared with prior exam series 4, image 58). The largest nodes measure approximately 1.2 cm. The thyroid gland, trachea, and esophagus demonstrate no significant findings. Lungs/Pleura: Minimal emphysema. Bibasilar scarring or atelectasis. No pleural effusion or pneumothorax. Musculoskeletal: No chest wall mass or suspicious bone lesions identified. CT ABDOMEN PELVIS FINDINGS Hepatobiliary: No focal liver abnormality is seen. Status post cholecystectomy. No biliary dilatation. Pancreas: Unremarkable. No pancreatic ductal dilatation or surrounding inflammatory changes. Spleen: Gross splenomegaly, maximum coronal span approximately 23.7 cm, previously 17.4 cm. Adrenals/Urinary Tract: Adrenal glands are unremarkable. Kidneys are normal, without renal calculi, focal lesion, or hydronephrosis. Bladder is unremarkable. Stomach/Bowel: Stomach is within normal limits. Appendix appears normal. No evidence of bowel wall thickening, distention, or inflammatory changes. Vascular/Lymphatic: Severe calcific atherosclerosis. Left common iliac artery stent. Numerous enlarged retroperitoneal lymph nodes, increased in size compared to prior examination (series 2, image 76). The largest left para-aortic lymph node conglomerate measures approximately 3.4 cm, previously 1.9 cm. Reproductive: No mass or other abnormality. Other: No abdominal wall hernia or abnormality. No abdominopelvic ascites.  Musculoskeletal: No acute or significant osseous findings. IMPRESSION: 1. Interval enlargement of axillary, mediastinal, and retroperitoneal lymph nodes, as well as increased splenomegaly, findings concerning for progression of CLL in comparison to prior PET-CT dated 03/22/2017. 2.  Other chronic and incidental findings as detailed above. Electronically Signed   By: AEddie CandleM.D.   On: 04/06/2018 09:13   Dg Chest Port 1 View  Result Date: 04/17/2018 CLINICAL DATA:  Fever. Weakness. EXAM: PORTABLE CHEST 1 VIEW COMPARISON:  Chest CT 04/06/2018 FINDINGS: Tip of the right chest port in the mid SVC. Upper normal heart size with unchanged mediastinal  contours. Aortic atherosclerosis. No focal airspace disease. No pulmonary edema, pneumothorax, or large pleural effusion. No acute osseous abnormalities are seen. IMPRESSION: No acute chest findings. Electronically Signed   By: Keith Rake M.D.   On: 04/17/2018 21:23    All questions were answered. The patient knows to call the clinic with any problems, questions or concerns. No barriers to learning was detected.  I spent 15 minutes counseling the patient face to face. The total time spent in the appointment was 20 minutes and more than 50% was on counseling and review of test results  [name], MD 04/24/2018 8:59 AM

## 2018-04-24 NOTE — Assessment & Plan Note (Signed)
He has stopped taking chemotherapy last week while hospitalized He is fully recovered from recent infection He has resumed taking his chemotherapy 2 days ago and felt fine We will continue the same He will return here weekly for blood draw and I will see him back again in a month for further follow-up.  He agreed with the plan of care

## 2018-04-24 NOTE — Assessment & Plan Note (Signed)
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  Observe only for now.

## 2018-04-26 LAB — CULTURE, BLOOD (ROUTINE X 2)
Culture: NO GROWTH
Culture: NO GROWTH
Special Requests: ADEQUATE
Special Requests: ADEQUATE

## 2018-05-04 ENCOUNTER — Inpatient Hospital Stay: Payer: Medicare Other

## 2018-05-04 ENCOUNTER — Other Ambulatory Visit: Payer: Self-pay

## 2018-05-04 ENCOUNTER — Telehealth: Payer: Self-pay

## 2018-05-04 ENCOUNTER — Ambulatory Visit: Payer: Self-pay | Admitting: Hematology and Oncology

## 2018-05-04 DIAGNOSIS — C8308 Small cell B-cell lymphoma, lymph nodes of multiple sites: Secondary | ICD-10-CM | POA: Diagnosis not present

## 2018-05-04 LAB — COMPREHENSIVE METABOLIC PANEL
ALT: 16 U/L (ref 0–44)
AST: 16 U/L (ref 15–41)
Albumin: 3.7 g/dL (ref 3.5–5.0)
Alkaline Phosphatase: 100 U/L (ref 38–126)
Anion gap: 8 (ref 5–15)
BUN: 10 mg/dL (ref 8–23)
CHLORIDE: 107 mmol/L (ref 98–111)
CO2: 24 mmol/L (ref 22–32)
CREATININE: 0.95 mg/dL (ref 0.61–1.24)
Calcium: 8.9 mg/dL (ref 8.9–10.3)
GFR calc Af Amer: >60 mL/min (ref >60)
GFR calc non Af Amer: >60 mL/min (ref >60)
Glucose, Bld: 154 mg/dL — ABNORMAL HIGH (ref 70–99)
POTASSIUM: 4.4 mmol/L (ref 3.5–5.1)
Sodium: 139 mmol/L (ref 135–145)
Total Bilirubin: 0.6 mg/dL (ref 0.3–1.2)
Total Protein: 6.4 g/dL — ABNORMAL LOW (ref 6.5–8.1)

## 2018-05-04 LAB — CBC WITH DIFFERENTIAL/PLATELET
Abs Immature Granulocytes: 0.08 10*3/uL — ABNORMAL HIGH (ref 0.00–0.07)
Basophils Absolute: 0.2 10*3/uL — ABNORMAL HIGH (ref 0.0–0.1)
Basophils Relative: 0 %
Eosinophils Absolute: 0.1 10*3/uL (ref 0.0–0.5)
Eosinophils Relative: 0 %
HCT: 39.4 % (ref 39.0–52.0)
Hemoglobin: 12.7 g/dL — ABNORMAL LOW (ref 13.0–17.0)
Immature Granulocytes: 0 %
Lymphocytes Relative: 92 %
Lymphs Abs: 48 10*3/uL — ABNORMAL HIGH (ref 0.7–4.0)
MCH: 34 pg (ref 26.0–34.0)
MCHC: 32.2 g/dL (ref 30.0–36.0)
MCV: 105.3 fL — ABNORMAL HIGH (ref 80.0–100.0)
Monocytes Absolute: 0.4 10*3/uL (ref 0.1–1.0)
Monocytes Relative: 1 %
NEUTROS PCT: 7 %
Neutro Abs: 3.5 10*3/uL (ref 1.7–7.7)
Platelets: 72 10*3/uL — ABNORMAL LOW (ref 150–400)
RBC: 3.74 MIL/uL — ABNORMAL LOW (ref 4.22–5.81)
RDW: 17.7 % — AB (ref 11.5–15.5)
WBC: 52.3 10*3/uL (ref 4.0–10.5)
nRBC: 0 % (ref 0.0–0.2)

## 2018-05-04 LAB — URIC ACID: URIC ACID, SERUM: 4.7 mg/dL (ref 3.7–8.6)

## 2018-05-04 LAB — LACTATE DEHYDROGENASE: LDH: 202 U/L — ABNORMAL HIGH (ref 98–192)

## 2018-05-04 NOTE — Telephone Encounter (Signed)
Called and left CBC results per Dr. Alvy Bimler. Platelet count improved and WBC increased. Continue Acalabrutinib and call the office for questions.

## 2018-05-07 ENCOUNTER — Other Ambulatory Visit: Payer: Self-pay

## 2018-05-07 ENCOUNTER — Telehealth: Payer: Self-pay | Admitting: Neurology

## 2018-05-07 ENCOUNTER — Telehealth (INDEPENDENT_AMBULATORY_CARE_PROVIDER_SITE_OTHER): Payer: Medicare Other | Admitting: Neurology

## 2018-05-07 DIAGNOSIS — G629 Polyneuropathy, unspecified: Secondary | ICD-10-CM | POA: Diagnosis not present

## 2018-05-07 NOTE — Telephone Encounter (Signed)
Left message

## 2018-05-07 NOTE — Progress Notes (Signed)
Virtual Visit via Video Note  I connected with  on 05/07/18 at  by Video and verified that I am speaking with the correct person using two identifiers.   I discussed the limitations, risks, security and privacy concerns of performing an evaluation and management service by telephone and the availability of in person appointments. I also discussed with the patient that there may be a patient responsible charge related to this service. The patient expressed understanding and agreed to proceed.   History of Present Illness: Mr. Babe he has small cell B-cell lymphoma, of lymph node at multiple sites, chronic lymphocytic leukemia, no evidence of solid organ involvement, he received chemotherapy FCR x6 cycles from May 2014 to October 2014, chemotherapy Bendamustine and and rituximab from October 06, 2016 to February 24, 2017,  Most recent CAT scan on March 22, 5636, no hypermetabolic adenopathy identified within the neck, chest, abdomen or pelvic, prominent left retroperitoneal lymph node measuring 1.6 cm without significant FDG uptake, splenomegaly,  He has diabetes since 2012, still works as an Chief Financial Officer,  His diabetic symptoms started around 2009, he noticed bilateral feet numbness tingling, at the bottom of his feet, actually underwent bilateral tarsal tunnel release surgery did not help his symptoms, reported abnormal EMG nerve conduction study in the past from outside office, never had a repeat study in the laboratory,  Reviewing the record, previously had evidence of vitamin B12, has been on vitamin B12 IM supplement since,  He has tried and failed gabapentin, Cymbalta, Topamax, currently taking Lyrica 100 mg 3 times a day +200 mg at bedtime, and gabapentin 300 mg twice a day.  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 His symptoms overall is under good control with current dose of nortriptyline 25 mg 4 tablets every night, Lyrica 50 mg 3 times a day,  Assessment and Plan: Peripheral neuropathy  Refilled his Lyrica 200 mg 3 times a day, +2 tablets every night  Nortriptyline 25 mg 4 tablets every night  Follow Up Instructions:  Return to clinic in 6 months    I discussed the assessment and treatment plan with the patient. The patient was provided an opportunity to ask questions and all were answered. The patient agreed with the plan and demonstrated an understanding of the instructions.   The patient was advised to call back or seek an in-person evaluation if the symptoms worsen or if the condition fails to improve as anticipated.  I provided 15 minutes of non-face-to-face time during this encounter.        Marcial Pacas, MD

## 2018-05-08 ENCOUNTER — Telehealth: Payer: Self-pay | Admitting: Neurology

## 2018-05-08 ENCOUNTER — Encounter: Payer: Self-pay | Admitting: Neurology

## 2018-05-08 MED ORDER — PREGABALIN 200 MG PO CAPS: ORAL_CAPSULE | ORAL | 5 refills | Status: DC

## 2018-05-11 ENCOUNTER — Inpatient Hospital Stay: Payer: Medicare Other

## 2018-05-11 ENCOUNTER — Telehealth: Payer: Self-pay

## 2018-05-11 ENCOUNTER — Other Ambulatory Visit: Payer: Self-pay

## 2018-05-11 DIAGNOSIS — C8308 Small cell B-cell lymphoma, lymph nodes of multiple sites: Secondary | ICD-10-CM

## 2018-05-11 LAB — CBC WITH DIFFERENTIAL/PLATELET
ABS IMMATURE GRANULOCYTES: 0.12 10*3/uL — AB (ref 0.00–0.07)
Basophils Absolute: 0.1 10*3/uL (ref 0.0–0.1)
Basophils Relative: 0 %
EOS PCT: 0 %
Eosinophils Absolute: 0.2 10*3/uL (ref 0.0–0.5)
HCT: 43.4 % (ref 39.0–52.0)
Hemoglobin: 13.6 g/dL (ref 13.0–17.0)
Immature Granulocytes: 0 %
Lymphocytes Relative: 94 %
Lymphs Abs: 69.8 10*3/uL — ABNORMAL HIGH (ref 0.7–4.0)
MCH: 33.2 pg (ref 26.0–34.0)
MCHC: 31.3 g/dL (ref 30.0–36.0)
MCV: 105.9 fL — ABNORMAL HIGH (ref 80.0–100.0)
MONOS PCT: 1 %
Monocytes Absolute: 0.6 10*3/uL (ref 0.1–1.0)
Neutro Abs: 3.9 10*3/uL (ref 1.7–7.7)
Neutrophils Relative %: 5 %
Platelets: 64 10*3/uL — ABNORMAL LOW (ref 150–400)
RBC: 4.1 MIL/uL — ABNORMAL LOW (ref 4.22–5.81)
RDW: 18.6 % — ABNORMAL HIGH (ref 11.5–15.5)
WBC: 74.7 10*3/uL (ref 4.0–10.5)
nRBC: 0 % (ref 0.0–0.2)

## 2018-05-11 LAB — COMPREHENSIVE METABOLIC PANEL
ALT: 15 U/L (ref 0–44)
AST: 14 U/L — ABNORMAL LOW (ref 15–41)
Albumin: 4 g/dL (ref 3.5–5.0)
Alkaline Phosphatase: 108 U/L (ref 38–126)
Anion gap: 8 (ref 5–15)
BILIRUBIN TOTAL: 0.6 mg/dL (ref 0.3–1.2)
BUN: 12 mg/dL (ref 8–23)
CO2: 25 mmol/L (ref 22–32)
Calcium: 9 mg/dL (ref 8.9–10.3)
Chloride: 104 mmol/L (ref 98–111)
Creatinine, Ser: 1.11 mg/dL (ref 0.61–1.24)
GFR calc Af Amer: >60 mL/min (ref >60)
GFR calc non Af Amer: >60 mL/min (ref >60)
Glucose, Bld: 163 mg/dL — ABNORMAL HIGH (ref 70–99)
Potassium: 4.6 mmol/L (ref 3.5–5.1)
Sodium: 137 mmol/L (ref 135–145)
TOTAL PROTEIN: 6.7 g/dL (ref 6.5–8.1)

## 2018-05-11 LAB — URIC ACID: Uric Acid, Serum: 4.5 mg/dL (ref 3.7–8.6)

## 2018-05-11 LAB — LACTATE DEHYDROGENASE: LDH: 201 U/L — ABNORMAL HIGH (ref 98–192)

## 2018-05-11 NOTE — Telephone Encounter (Signed)
Called pt and gave lab results from today.  Per Dr Alvy Bimler continue medication as prescribed.  Reviewed upcoming appts.  Pt verbalizes understanding.

## 2018-05-15 MED FILL — CALQUENCE 100 MG CAPSULE: 100 | 30 days supply | Qty: 60 | Fill #1

## 2018-05-17 ENCOUNTER — Encounter: Payer: Self-pay | Admitting: Hematology and Oncology

## 2018-05-18 ENCOUNTER — Ambulatory Visit: Payer: Self-pay

## 2018-05-18 ENCOUNTER — Telehealth: Payer: Self-pay | Admitting: *Deleted

## 2018-05-18 ENCOUNTER — Other Ambulatory Visit: Payer: Self-pay

## 2018-05-18 ENCOUNTER — Inpatient Hospital Stay: Payer: Medicare Other | Attending: Hematology and Oncology

## 2018-05-18 DIAGNOSIS — C8308 Small cell B-cell lymphoma, lymph nodes of multiple sites: Secondary | ICD-10-CM | POA: Insufficient documentation

## 2018-05-18 DIAGNOSIS — Z79899 Other long term (current) drug therapy: Secondary | ICD-10-CM | POA: Insufficient documentation

## 2018-05-18 DIAGNOSIS — D696 Thrombocytopenia, unspecified: Secondary | ICD-10-CM | POA: Insufficient documentation

## 2018-05-18 DIAGNOSIS — J449 Chronic obstructive pulmonary disease, unspecified: Secondary | ICD-10-CM | POA: Diagnosis not present

## 2018-05-18 LAB — CBC WITH DIFFERENTIAL (CANCER CENTER ONLY)
Abs Immature Granulocytes: 0.13 10*3/uL — ABNORMAL HIGH (ref 0.00–0.07)
Basophils Absolute: 0 10*3/uL (ref 0.0–0.1)
Basophils Relative: 0 %
Eosinophils Absolute: 0.1 10*3/uL (ref 0.0–0.5)
Eosinophils Relative: 0 %
HCT: 41.7 % (ref 39.0–52.0)
Hemoglobin: 13.5 g/dL (ref 13.0–17.0)
Immature Granulocytes: 0 %
Lymphocytes Relative: 93 %
Lymphs Abs: 64.8 10*3/uL — ABNORMAL HIGH (ref 0.7–4.0)
MCH: 34.1 pg — ABNORMAL HIGH (ref 26.0–34.0)
MCHC: 32.4 g/dL (ref 30.0–36.0)
MCV: 105.3 fL — ABNORMAL HIGH (ref 80.0–100.0)
Monocytes Absolute: 0.6 10*3/uL (ref 0.1–1.0)
Monocytes Relative: 1 %
Neutro Abs: 3.9 10*3/uL (ref 1.7–7.7)
Neutrophils Relative %: 6 %
Platelet Count: 71 10*3/uL — ABNORMAL LOW (ref 150–400)
RBC: 3.96 MIL/uL — ABNORMAL LOW (ref 4.22–5.81)
RDW: 18.7 % — ABNORMAL HIGH (ref 11.5–15.5)
WBC Count: 69.6 10*3/uL (ref 4.0–10.5)
nRBC: 0 % (ref 0.0–0.2)

## 2018-05-18 LAB — COMPREHENSIVE METABOLIC PANEL
ALT: 16 U/L (ref 0–44)
AST: 13 U/L — ABNORMAL LOW (ref 15–41)
Albumin: 4.1 g/dL (ref 3.5–5.0)
Alkaline Phosphatase: 104 U/L (ref 38–126)
Anion gap: 8 (ref 5–15)
BUN: 9 mg/dL (ref 8–23)
CO2: 25 mmol/L (ref 22–32)
Calcium: 9.2 mg/dL (ref 8.9–10.3)
Chloride: 105 mmol/L (ref 98–111)
Creatinine, Ser: 1.02 mg/dL (ref 0.61–1.24)
GFR calc Af Amer: >60 mL/min (ref >60)
GFR calc non Af Amer: >60 mL/min (ref >60)
Glucose, Bld: 156 mg/dL — ABNORMAL HIGH (ref 70–99)
Potassium: 4.7 mmol/L (ref 3.5–5.1)
Sodium: 138 mmol/L (ref 135–145)
Total Bilirubin: 0.6 mg/dL (ref 0.3–1.2)
Total Protein: 6.5 g/dL (ref 6.5–8.1)

## 2018-05-18 LAB — LACTATE DEHYDROGENASE: LDH: 192 U/L (ref 98–192)

## 2018-05-18 LAB — URIC ACID: Uric Acid, Serum: 4.7 mg/dL (ref 3.7–8.6)

## 2018-05-18 NOTE — Telephone Encounter (Signed)
Telephone call to patient. Reviewed labs completed today. He is doing well. No concerns or questions at this time.

## 2018-05-25 ENCOUNTER — Inpatient Hospital Stay: Payer: Medicare Other

## 2018-05-25 ENCOUNTER — Inpatient Hospital Stay (HOSPITAL_BASED_OUTPATIENT_CLINIC_OR_DEPARTMENT_OTHER): Payer: Medicare Other | Admitting: Hematology and Oncology

## 2018-05-25 ENCOUNTER — Encounter: Payer: Self-pay | Admitting: Hematology and Oncology

## 2018-05-25 ENCOUNTER — Other Ambulatory Visit: Payer: Self-pay

## 2018-05-25 ENCOUNTER — Encounter: Payer: Self-pay | Admitting: *Deleted

## 2018-05-25 DIAGNOSIS — C8308 Small cell B-cell lymphoma, lymph nodes of multiple sites: Secondary | ICD-10-CM

## 2018-05-25 DIAGNOSIS — D696 Thrombocytopenia, unspecified: Secondary | ICD-10-CM

## 2018-05-25 DIAGNOSIS — J449 Chronic obstructive pulmonary disease, unspecified: Secondary | ICD-10-CM | POA: Diagnosis not present

## 2018-05-25 LAB — CBC WITH DIFFERENTIAL/PLATELET
Abs Immature Granulocytes: 0.09 10*3/uL — ABNORMAL HIGH (ref 0.00–0.07)
Basophils Absolute: 0 10*3/uL (ref 0.0–0.1)
Basophils Relative: 0 %
Eosinophils Absolute: 0.1 10*3/uL (ref 0.0–0.5)
Eosinophils Relative: 0 %
HCT: 41.4 % (ref 39.0–52.0)
Hemoglobin: 13.1 g/dL (ref 13.0–17.0)
Immature Granulocytes: 0 %
Lymphocytes Relative: 92 %
Lymphs Abs: 54.8 10*3/uL — ABNORMAL HIGH (ref 0.7–4.0)
MCH: 33.7 pg (ref 26.0–34.0)
MCHC: 31.6 g/dL (ref 30.0–36.0)
MCV: 106.4 fL — ABNORMAL HIGH (ref 80.0–100.0)
Monocytes Absolute: 0.6 10*3/uL (ref 0.1–1.0)
Monocytes Relative: 1 %
Neutro Abs: 4.4 10*3/uL (ref 1.7–7.7)
Neutrophils Relative %: 7 %
Platelets: 63 10*3/uL — ABNORMAL LOW (ref 150–400)
RBC: 3.89 MIL/uL — ABNORMAL LOW (ref 4.22–5.81)
RDW: 18.9 % — ABNORMAL HIGH (ref 11.5–15.5)
WBC: 60 10*3/uL (ref 4.0–10.5)
nRBC: 0 % (ref 0.0–0.2)

## 2018-05-25 LAB — COMPREHENSIVE METABOLIC PANEL
ALT: 14 U/L (ref 0–44)
AST: 15 U/L (ref 15–41)
Albumin: 3.9 g/dL (ref 3.5–5.0)
Alkaline Phosphatase: 97 U/L (ref 38–126)
Anion gap: 10 (ref 5–15)
BUN: 10 mg/dL (ref 8–23)
CO2: 23 mmol/L (ref 22–32)
Calcium: 9.1 mg/dL (ref 8.9–10.3)
Chloride: 106 mmol/L (ref 98–111)
Creatinine, Ser: 0.98 mg/dL (ref 0.61–1.24)
GFR calc Af Amer: >60 mL/min (ref >60)
GFR calc non Af Amer: >60 mL/min (ref >60)
Glucose, Bld: 191 mg/dL — ABNORMAL HIGH (ref 70–99)
Potassium: 4.4 mmol/L (ref 3.5–5.1)
Sodium: 139 mmol/L (ref 135–145)
Total Bilirubin: 0.5 mg/dL (ref 0.3–1.2)
Total Protein: 6.4 g/dL — ABNORMAL LOW (ref 6.5–8.1)

## 2018-05-25 LAB — URIC ACID: Uric Acid, Serum: 4.5 mg/dL (ref 3.7–8.6)

## 2018-05-25 LAB — LACTATE DEHYDROGENASE: LDH: 175 U/L (ref 98–192)

## 2018-05-25 NOTE — Assessment & Plan Note (Signed)
He tolerated treatment well The lymphocytosis is expected There are no palpable lymphadenopathy on exam He has some bruising which is not unexpected We will proceed with treatment without dose adjustment I will see him again in a month for further follow-up I plan to repeat imaging study after resolution of lymphocytosis

## 2018-05-25 NOTE — Assessment & Plan Note (Signed)
He has persistent pancytopenia due CLL, liver disease and chronic splenomegaly He is not symptomatic except for minor bruising.  Observe only for now.

## 2018-05-25 NOTE — Progress Notes (Signed)
Critical value received from lab.  WBC 60.  Message given to Jethro Bolus, RN with Dr. Alvy Bimler.

## 2018-05-25 NOTE — Progress Notes (Signed)
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  Observe only for now.  COPD mixed type ([place]) He has chronic, mild intermittent cough Denies fever or chills This is likely due to his history of COPD Observe only   No orders of the defined types were placed in this encounter.   INTERVAL HISTORY: Please see below for problem oriented charting. 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  No evidence of aortic dissection or other acute process in the chest. 2.  Centrilobular emphysema with a 5 mm right lung nodule. Given the concurrent centrilobular emphysema, follow-up chest CT at 6 -12 months is recommended.  3.  Coronary artery atherosclerosis which is age advanced. 4.  Prominent thoracic lymph nodes.  These can be reevaluated at follow-up.    05/06/2010 Imaging    1.  Multiple  small periaortic lymph nodes consistent with the patient's history of the chronic lymphocytic leukemia. 2.  No evidence of solid organ involvement    11/03/2011 Imaging    1.  Interval progression of abdominal and pelvic adenopathy. 2.  Progression of splenomegaly.  The spleen now measures 23 cm in length    11/18/2011 Bone Marrow Biopsy    Bone Marrow, Aspirate,Biopsy, and Clot, right iliac bone - HYPERCELLULAR BONE MARROW WITH EXTENSIVE INVOLVEMENT BY CHRONIC LYMPHOCYTIC LEUKEMIA. PERIPHERAL BLOOD: - CHRONIC LYMPHOCYTIC LEUKEMIA    03/08/2012 Imaging    1.  Progressive increase in retroperitoneal, iliac, and inguinal lymphadenopathy. 2.  Interval increase in massive splenomegaly.     06/01/2012 Procedure    Placement of single lumen port a cath via right internal jugular vein.  The catheter tip lies at the cavoatrial junction.  A power injectable port a cath was placed and is ready for immediate use    06/20/2012 - 11/23/2012 Chemotherapy    He received FCR x 6 cycles    12/19/2012 Imaging    Left common iliac stent. Abdominal vasculature remains patent. Improving supraclavicular and axillary lymphadenopathy. Residual right subpectoral nodes measure up to 10 mm short axis. Improving retroperitoneal lymphadenopathy, measuring up to 16 mm short axis. Improving splenomegaly, measuring 18.7 cm.     01/20/2016 Imaging    1. Stable exam.  No new or progressive findings. 2. No CT findings to explain odynophagia    09/02/2016 Pathology Results    The findings are consistent with involvement by previously known chronic lymphocytic leukemia    09/02/2016 Pathology Results  FISH for CLL came back positive for deletion 13q    09/15/2016 Imaging    1. Borderline enlarged abdominal and pelvic lymph nodes. Compared with 11/03/2011 these are decreased in size as detailed above. 2. Persistent splenomegaly. 3. Aortic Atherosclerosis (ICD10-I70.0). LAD coronary artery calcification noted.     10/06/2016 - 02/24/2017 Chemotherapy    He received Bendamustine and Rituxan    11/03/2016 Adverse Reaction    Dose of Bendamustine is reduced due to severe pancytopenia    12/28/2016 Imaging    1. Borderline enlarged abdominal peritoneal ligament and abdominal retroperitoneal lymph nodes, stable. 2. Splenomegaly. 3.  Aortic atherosclerosis (ICD10-170.0).    03/22/2017 PET scan    1. No hypermetabolic adenopathy identified within the neck, chest, abdomen or pelvis. 2. Prominent left retroperitoneal node measures 1.6 cm without significant FDG uptake. 3. Splenomegaly. 4. Aortic Atherosclerosis (ICD10-I70.0) and Emphysema (ICD10-J43.9). LAD and left circumflex atherosclerotic calcifications noted.    02/02/2018 Procedure    IMPRESSION: Widely patent right IJ power port catheter.    04/06/2018 Imaging    1. Interval enlargement of axillary, mediastinal, and retroperitoneal lymph nodes, as well as increased splenomegaly, findings concerning for progression of CLL in comparison to prior PET-CT dated 03/22/2017.  2.  Other chronic and incidental findings as detailed above.     04/16/2018 -  Chemotherapy    The patient had acalabrutinib for chemo    04/17/2018 - 04/21/2018 Hospital Admission    He was hospitalized for influenza     REVIEW OF SYSTEMS:   Constitutional: Denies fevers, chills or abnormal weight loss Eyes: Denies blurriness of vision Ears, nose, mouth, throat, and face: Denies mucositis or sore throat Respiratory: Denies cough, dyspnea or wheezes Cardiovascular: Denies palpitation, chest discomfort or lower extremity swelling Gastrointestinal:  Denies nausea, heartburn or change in bowel habits Lymphatics: Denies new lymphadenopathy or easy bruising Neurological:Denies numbness, tingling or new weaknesses Behavioral/Psych: Mood is stable, no new changes  All other systems were reviewed with the patient and are negative.  I have reviewed the past medical history, past  surgical history, social history and family history with the patient and they are unchanged from previous note.  ALLERGIES:  is allergic to codeine.  MEDICATIONS:  Current Outpatient Medications  Medication Sig Dispense Refill  . Acalabrutinib 100 MG CAPS Take 100 mg by mouth 2 (two) times daily.    . acyclovir (ZOVIRAX) 400 MG tablet Take 1 tablet (400 mg total) by mouth 2 (two) times daily. 180 tablet 1  . albuterol (PROVENTIL) (2.5 MG/3ML) 0.083% nebulizer solution Take 3 mLs (2.5 mg total) by nebulization 2 (two) times daily as needed for wheezing or shortness of breath. 75 mL 0  . allopurinol (ZYLOPRIM) 300 MG tablet Take 1 tablet (300 mg total) by mouth daily. 30 tablet 5  . ALPRAZolam (XANAX) 0.25 MG tablet Take 0.25 mg by mouth as needed for anxiety.    . b complex vitamins tablet Take 1 tablet by mouth daily.     . benzonatate (TESSALON) 100 MG capsule Take 2 capsules (200 mg total) by mouth 2 (two) times daily as needed for cough. 20 capsule 0  . Cholecalciferol (VITAMIN D-1000 MAX ST) 1000 units tablet Take 1,000 Units by mouth daily.     . fluticasone (FLONASE) 50 MCG/ACT nasal spray Place 2 sprays into the nose daily.     . furosemide (LASIX) 20 MG tablet Take 1 tablet (20 mg total) by mouth daily as needed. 30 tablet 3  . HYDROcodone-acetaminophen (NORCO)   10-325 MG tablet Take 1 tablet by mouth See admin instructions. Every 6 to 8 hours as needed for pain  0  . lidocaine-prilocaine (EMLA) cream APPLY TO AFFECTED AREA ONCE (Patient taking differently: Apply 1 application topically once. ) 30 g 3  . meclizine (ANTIVERT) 12.5 MG tablet Take 12.5 mg by mouth every 6 (six) hours as needed for dizziness.   0  . metFORMIN (GLUCOPHAGE-XR) 500 MG 24 hr tablet Take 500 mg by mouth See admin instructions. Takes one tablet in AM and 2 tablets at night.    . metoprolol succinate (TOPROL-XL) 25 MG 24 hr tablet Take 0.5 tablets (12.5 mg total) by mouth daily. 30 tablet 0  . nitroGLYCERIN  (NITROSTAT) 0.4 MG SL tablet PLACE 1 TABLET UNDER THE TONGUE EVERY 5 MINUTES X 3 DOSES AS NEEDED FOR CHEST PAIN *MAX 3 DOSES* (Patient taking differently: Place 0.4 mg under the tongue every 5 (five) minutes as needed for chest pain. ) 25 tablet 3  . nortriptyline (PAMELOR) 25 MG capsule TAKE 4 CAPSULES BY MOUTH AT BEDTIME. (Patient taking differently: Take 100 mg by mouth at bedtime. ) 360 capsule 1  . pregabalin (LYRICA) 200 MG capsule One tab three times a day and 2 tabs at night 150 capsule 5  . PROVENTIL HFA 108 (90 Base) MCG/ACT inhaler TAKE 2 PUFFS BY MOUTH EVERY 6 HOURS AS NEEDED FOR WHEEZE OR SHORTNESS OF BREATH (Patient taking differently: Inhale 2 puffs into the lungs every 6 (six) hours as needed for wheezing or shortness of breath. ) 20.1 Inhaler 2  . rosuvastatin (CRESTOR) 5 MG tablet TAKE 1 TABLET (5 MG TOTAL) BY MOUTH DAILY. 90 tablet 2  . sildenafil (VIAGRA) 100 MG tablet TAKE 1/2-1 TABLET DAILY AS NEEDED FOR ERECTILE DYSFUNCTION (30 MINUTES PRIOR TO SEXUAL INTERCOURSE) (Patient taking differently: Take 50-100 mg by mouth as needed for erectile dysfunction. ) 10 tablet 6   No current facility-administered medications for this visit.    Facility-Administered Medications Ordered in Other Visits  Medication Dose Route Frequency Provider Last Rate Last Dose  . 0.9 %  sodium chloride infusion   Intravenous Continuous Alvy Bimler, Shilah Hefel, MD 50 mL/hr at 03/07/14 1005    . sodium chloride 0.9 % injection 10 mL  10 mL Intracatheter PRN Marcy Panning, MD   10 mL at 08/22/12 1721    PHYSICAL EXAMINATION: ECOG PERFORMANCE STATUS: 1 - Symptomatic but completely ambulatory  Vitals:   05/25/18 0847  BP: 107/66  Pulse: 83  Resp: 18  Temp: 98.4 F (36.9 C)  SpO2: 100%   Filed Weights   05/25/18 0847  Weight: 209 lb 12.8 oz (95.2 kg)    GENERAL:alert, no distress and comfortable SKIN: skin color, texture, turgor are normal, no rashes or significant lesions.  Noted extensive skin  bruises EYES: normal, Conjunctiva are pink and non-injected, sclera clear OROPHARYNX:no exudate, no erythema and lips, buccal mucosa, and tongue normal  NECK: supple, thyroid normal size, non-tender, without nodularity LYMPH:  no palpable lymphadenopathy in the cervical, axillary or inguinal LUNGS: clear to auscultation and percussion with normal breathing effort HEART: regular rate & rhythm and no murmurs and no lower extremity edema ABDOMEN:abdomen soft, non-tender and normal bowel sounds Musculoskeletal:no cyanosis of digits and no clubbing  NEURO: alert & oriented x 3 with fluent speech, no focal motor/sensory deficits  LABORATORY DATA:  I have reviewed the data as listed    Component Value Date/Time   NA 139 05/25/2018 0834   NA 136 01/26/2017 0940  K 4.4 05/25/2018 0834   K 4.0 01/26/2017 0940   CL 106 05/25/2018 0834   CL 104 08/03/2012 1229   CO2 23 05/25/2018 0834   CO2 23 01/26/2017 0940   GLUCOSE 191 (H) 05/25/2018 0834   GLUCOSE 146 (H) 01/26/2017 0940   GLUCOSE 223 (H) 08/03/2012 1229   BUN 10 05/25/2018 0834   BUN 9.6 01/26/2017 0940   CREATININE 0.98 05/25/2018 0834   CREATININE 1.04 04/24/2018 0821   CREATININE 1.0 01/26/2017 0940   CALCIUM 9.1 05/25/2018 0834   CALCIUM 8.7 01/26/2017 0940   PROT 6.4 (L) 05/25/2018 0834   PROT 5.9 (L) 04/13/2017 0823   PROT 5.7 (L) 01/26/2017 0940   ALBUMIN 3.9 05/25/2018 0834   ALBUMIN 3.6 01/26/2017 0940   AST 15 05/25/2018 0834   AST 13 (L) 04/24/2018 0821   AST 13 01/26/2017 0940   ALT 14 05/25/2018 0834   ALT 10 04/24/2018 0821   ALT 12 01/26/2017 0940   ALKPHOS 97 05/25/2018 0834   ALKPHOS 78 01/26/2017 0940   BILITOT 0.5 05/25/2018 0834   BILITOT 0.7 04/24/2018 0821   BILITOT 0.64 01/26/2017 0940   GFRNONAA >60 05/25/2018 0834   GFRNONAA >60 04/24/2018 0821   GFRAA >60 05/25/2018 0834   GFRAA >60 04/24/2018 0821    No results found for: SPEP, UPEP  Lab Results  Component Value Date   WBC 60.0 (HH)  05/25/2018   NEUTROABS 4.4 05/25/2018   HGB 13.1 05/25/2018   HCT 41.4 05/25/2018   MCV 106.4 (H) 05/25/2018   PLT 63 (L) 05/25/2018      Chemistry      Component Value Date/Time   NA 139 05/25/2018 0834   NA 136 01/26/2017 0940   K 4.4 05/25/2018 0834   K 4.0 01/26/2017 0940   CL 106 05/25/2018 0834   CL 104 08/03/2012 1229   CO2 23 05/25/2018 0834   CO2 23 01/26/2017 0940   BUN 10 05/25/2018 0834   BUN 9.6 01/26/2017 0940   CREATININE 0.98 05/25/2018 0834   CREATININE 1.04 04/24/2018 0821   CREATININE 1.0 01/26/2017 0940      Component Value Date/Time   CALCIUM 9.1 05/25/2018 0834   CALCIUM 8.7 01/26/2017 0940   ALKPHOS 97 05/25/2018 0834   ALKPHOS 78 01/26/2017 0940   AST 15 05/25/2018 0834   AST 13 (L) 04/24/2018 0821   AST 13 01/26/2017 0940   ALT 14 05/25/2018 0834   ALT 10 04/24/2018 0821   ALT 12 01/26/2017 0940   BILITOT 0.5 05/25/2018 0834   BILITOT 0.7 04/24/2018 0821   BILITOT 0.64 01/26/2017 0940       All questions were answered. The patient knows to call the clinic with any problems, questions or concerns. No barriers to learning was detected.  I spent 15 minutes counseling the patient face to face. The total time spent in the appointment was 20 minutes and more than 50% was on counseling and review of test results  [name], MD 05/25/2018 10:06 AM

## 2018-05-25 NOTE — Assessment & Plan Note (Signed)
He has chronic, mild intermittent cough Denies fever or chills This is likely due to his history of COPD Observe only

## 2018-05-28 ENCOUNTER — Telehealth: Payer: Self-pay | Admitting: Hematology and Oncology

## 2018-05-28 NOTE — Telephone Encounter (Signed)
Called regarding 5/8

## 2018-06-07 NOTE — Telephone Encounter (Signed)
Virtual Visit Pre-Appointment Phone Call  "(Name), I am calling you today to discuss your upcoming appointment. We are currently trying to limit exposure to the virus that causes COVID-19 by seeing patients at home rather than in the office."  1. "What is the BEST phone number to call the day of the visit?" - include this in appointment notes  2. "Do you have or have access to (through a family member/friend) a smartphone with video capability that we can use for your visit?" a. If yes - list this number in appt notes as "cell" (if different from BEST phone #) and list the appointment type as a VIDEO visit in appointment notes b. If no - list the appointment type as a PHONE visit in appointment notes  3. Confirm consent - "In the setting of the current Covid19 crisis, you are scheduled for a (phone or video) visit with your provider on (Tuesday, April 28) at (8:00 am).  Just as we do with many in-office visits, in order for you to participate in this visit, we must obtain consent.  If you'd like, I can send this to your mychart (if signed up) or email for you to review.  Otherwise, I can obtain your verbal consent now.  All virtual visits are billed to your insurance company just like a normal visit would be.  By agreeing to a virtual visit, we'd like you to understand that the technology does not allow for your provider to perform an examination, and thus may limit your provider's ability to fully assess your condition. If your provider identifies any concerns that need to be evaluated in person, we will make arrangements to do so.  Finally, though the technology is pretty good, we cannot assure that it will always work on either your or our end, and in the setting of a video visit, we may have to convert it to a phone-only visit.  In either situation, we cannot ensure that we have a secure connection.  Are you willing to proceed?" STAFF: Did the patient verbally acknowledge consent to telehealth  visit? Document YES/NO here: yes  4. Advise patient to be prepared - "Two hours prior to your appointment, go ahead and check your blood pressure, pulse, oxygen saturation, and your weight (if you have the equipment to check those) and write them all down. When your visit starts, your provider will ask you for this information. If you have an Apple Watch or Kardia device, please plan to have heart rate information ready on the day of your appointment. Please have a pen and paper handy nearby the day of the visit as well."  5. Give patient instructions for MyChart download to smartphone OR [email or web address] as below if video visit (depending on what platform provider is using)  6. Inform patient they will receive a phone call 15 minutes prior to their appointment time (may be from unknown caller ID) so they should be prepared to answer    TELEPHONE CALL NOTE  Ryan Copeland has been deemed a candidate for a follow-up tele-health visit to limit community exposure during the Covid-19 pandemic. I spoke with the patient via phone to ensure availability of phone/video source, confirm preferred email & phone number, and discuss instructions and expectations.  I reminded Ryan Copeland to be prepared with any vital sign and/or heart rhythm information that could potentially be obtained via home monitoring, at the time of his visit. I reminded Ryan Copeland to expect a phone  call prior to his visit.  Danielle Avanell Shackleton 06/07/2018 11:13 AM   INSTRUCTIONS FOR DOWNLOADING THE MYCHART APP TO SMARTPHONE  - The patient must first make sure to have activated MyChart and know their login information - If Apple, go to [employer] and type in MyChart in the search bar and download the app. If Android, ask patient to go to [employer] and type in [place] in the search bar and download the app. The app is free but as with any other app downloads, their phone may require them to verify saved payment information  or Apple/Android password.  - The patient will need to then log into the app with their MyChart username and password, and select [facility] as their healthcare provider to link the account. When it is time for your visit, go to the MyChart app, find appointments, and click Begin Video Visit. Be sure to Select Allow for your device to access the Microphone and Camera for your visit. You will then be connected, and your provider will be with you shortly.  **If they have any issues connecting, or need assistance please contact MyChart service desk [phone])**  **If using a computer, in order to ensure the best quality for their visit they will need to use either of the following Internet Browsers: [employer], or Google Chrome**  IF USING DOXIMITY or DOXY.ME - The patient will receive a link just prior to their visit by text.     FULL LENGTH CONSENT FOR TELE-HEALTH VISIT   I hereby voluntarily request, consent and authorize [facility] and its employed or contracted physicians, physician assistants, nurse practitioners or other licensed health care professionals (the Practitioner), to provide me with telemedicine health care services (the "Services") as deemed necessary by the treating Practitioner. I acknowledge and consent to receive the Services by the Practitioner via telemedicine. I understand that the telemedicine visit will involve communicating with the Practitioner through live audiovisual communication technology and the disclosure of certain medical information by electronic transmission. I acknowledge that I have been given the opportunity to request an in-person assessment or other available alternative prior to the telemedicine visit and am voluntarily participating in the telemedicine visit.  I understand that I have the right to withhold or withdraw my consent to the use of telemedicine in the course of my care at any time, without affecting my right to future  care or treatment, and that the Practitioner or I may terminate the telemedicine visit at any time. I understand that I have the right to inspect all information obtained and/or recorded in the course of the telemedicine visit and may receive copies of available information for a reasonable fee.  I understand that some of the potential risks of receiving the Services via telemedicine include:  Marland Kitchen Delay or interruption in medical evaluation due to technological equipment failure or disruption; . Information transmitted may not be sufficient (e.g. poor resolution of images) to allow for appropriate medical decision making by the Practitioner; and/or  . In rare instances, security protocols could fail, causing a breach of personal health information.  Furthermore, I acknowledge that it is my responsibility to provide information about my medical history, conditions and care that is complete and accurate to the best of my ability. I acknowledge that Practitioner's advice, recommendations, and/or decision may be based on factors not within their control, such as incomplete or inaccurate data provided by me or distortions of diagnostic images or specimens that may result from  electronic transmissions. I understand that the practice of medicine is not an exact science and that Practitioner makes no warranties or guarantees regarding treatment outcomes. I acknowledge that I will receive a copy of this consent concurrently upon execution via email to the email address I last provided but may also request a printed copy by calling the office of [facility].    I understand that my insurance will be billed for this visit.   I have read or had this consent read to me. . I understand the contents of this consent, which adequately explains the benefits and risks of the Services being provided via telemedicine.  . I have been provided ample opportunity to ask questions regarding this consent and the Services and have  had my questions answered to my satisfaction. . I give my informed consent for the services to be provided through the use of telemedicine in my medical care  By participating in this telemedicine visit I agree to the above.

## 2018-06-08 MED FILL — CALQUENCE 100 MG CAPSULE: 100 | 30 days supply | Qty: 60 | Fill #2

## 2018-06-11 NOTE — Progress Notes (Signed)
Telehealth Visit     Virtual Visit via Video Note   This visit type was conducted due to national recommendations for restrictions regarding the COVID-19 Pandemic (e.g. social distancing) in an effort to limit this patient's exposure and mitigate transmission in our community.  Due to his co-morbid illnesses, this patient is at least at moderate risk for complications without adequate follow up.  This format is felt to be most appropriate for this patient at this time.  All issues noted in this document were discussed and addressed.  A limited physical exam was performed with this format.  Please refer to the patient's chart for his consent to telehealth for [facility].   Evaluation Performed:  Follow-up visit  This visit type was conducted due to national recommendations for restrictions regarding the COVID-19 Pandemic (e.g. social distancing).  This format is felt to be most appropriate for this patient at this time.  All issues noted in this document were discussed and addressed.  No physical exam was performed (except for noted visual exam findings with Video Visits).  Please refer to the patient's chart (MyChart message for video visits and phone note for telephone visits) for the patient's consent to telehealth for [facility].  Date:  06/12/2018   ID:  Ryan Copeland, DOB 1950/09/27, MRN [id number]  Patient Location:  Work  Provider location:   Home  PCP:  [name]., PA-C  Cardiologist:  Servando Snare Nahser Electrophysiologist:  None   Chief Complaint:  Follow up visit.   History of Present Illness:    Ryan Copeland is a 67 y.o. male who presents via audio/video conferencing for a telehealth visit today.  Seen for Dr. Acie Fredrickson. Former patient of Dr. Susa Simmonds. Primarily follows with me.   Has known prior NSTEMI and stenting of the LAD in 2011. Underwent repeat cath due to refractory symptoms back in August of 2016 - managed medically. Other issues include tobacco  abuse, PVD with past iliac stenting (previously followed at [facility]), HTN, HLD, DM, chronic thrombocytopenia and CLL- small cell B cell lymphoma. He sees neurology for his peripheral neuropathy.  Seen back in Septemberof 2017- had gained weight. Some intermittent chest heaviness - no response with NTG but I added Imdur to his regimen.When seenback in October - still with atypical chest pain - felt more like a pulled muscle but no relief with NSAID. He thought the Imdur had helped however. I talked with Dr. Acie Fredrickson - wanted to avoid Myoview - most likely it was felt to turn out abnormal. Elected to just follow for now.Ended uphaving GI work upinDecemberof 2017- this was felt to be possibly causing some of his prior chest pain -turned out to be a yeast infection and with treatment his chest pain resolved. We got him offImdur so he could resume Viagra. Still struggling with trying to stop smoking.He did have to go back on treatment for his CLL in 2018.   Last seen by me back in October of 2019 - he was doing well from our standpoint. 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 He had had a treatment reaction and had had hefty doses of Prednisone for a significant rash. Had some swelling of his lower legs at his last visit with me - not short of breath. I put him on very low dose prn Lasix. Not smoking. No chest pain. Cardiac status was felt to be stable.   He was admitted earlier in March  with flu A - he did test positive for flu A. Presented with leukocytosis, fever, tachypnea and hypotension. His metoprolol was to be cut back - he never did that.    The patient does not have symptoms concerning for COVID-19 infection (fever, chills, cough, or new shortness of breath).   Seen today via Doximity video. He has consented for this visit. He will be having labs at the [facility] in just a few weeks. He needs his lipids added if possible to  that lab. He is doing ok. Current job is wrapping up and he will be headed to [employer] for [employer]. He has no chest pain. BP little variable. He has been doing yard work. He notes his hospital stay last month was not ideal. He did not cut his Metoprolol back. BP is fine. HR is in the 80's. He feels good. He is back to his regular routine. Needs medicines refilled today. This is done. Only uses Lasix prn - not using very much. Some joint pain noted - he attributes to his cancer treatment. No claudication.   Past Medical History:  Diagnosis Date  . Anxiety 01/30/2014  . Back injury    lower disc  . CAD (coronary artery disease)   . CLL (chronic lymphocytic leukemia) ([place]) 03/18/2011  . Diabetes mellitus ([place])    Type 2   . ECRB (extensor carpi radialis brevis) tenosynovitis   . GERD (gastroesophageal reflux disease)    takes Nexium if needed  . Hyperlipidemia   . Lateral epicondylitis of left elbow   . MI, acute, non ST segment elevation ([place]) 06/28/2009   with stenting of the LAD  . Neuromuscular disorder ([place])    peripheral neuropathy  . PVD (peripheral vascular disease) ([place])   . Tobacco abuse    Past Surgical History:  Procedure Laterality Date  . ADENOIDECTOMY  1955  . CARDIAC CATHETERIZATION    . CARDIAC CATHETERIZATION N/A 09/26/2014   Procedure: Left Heart Cath and Coronary Angiography;  Surgeon: Peter M Martinique, MD;  Location: [facility];  Service: Cardiovascular;  Laterality: N/A;  . carpel tunnel release Left 04-1989  . carpel tunnel release  Right 01-1989  . CHOLECYSTECTOMY  2007  . CORONARY STENT PLACEMENT  May 2011  . femoral stents    . IR CV LINE INJECTION  08/18/2017  . IR CV LINE INJECTION  09/01/2017  . IR CV LINE INJECTION  02/02/2018  . LATERAL EPICONDYLE RELEASE Left 02/12/2014   Procedure: LEFT ELBOW DEBRIDEMENT WITH TENDON REPAIR ;  Surgeon: Lorn Junes, MD;  Location: [facility];  Service: Orthopedics;  Laterality: Left;  . LEFT CAI STENT/PTA AND  POPLITEAL ARTERY/TIBIAL THROMBECTOMY     . LEFT HEART CATHETERIZATION WITH CORONARY ANGIOGRAM N/A 08/26/2011   Procedure: LEFT HEART CATHETERIZATION WITH CORONARY ANGIOGRAM;  Surgeon: Peter M Martinique, MD;  Location: [facility];  Service: Cardiovascular;  Laterality: N/A;  . PERIPHERAL VASCULAR CATHETERIZATION N/A 01/01/2015   Procedure: Abdominal Aortogram;  Surgeon: Conrad New Bern, MD;  Location: [facility];  Service: Cardiovascular;  Laterality: N/A;  . TARSAL TUNNEL RELEASE Bilateral 08-2007     Current Meds  Medication Sig  . Acalabrutinib 100 MG CAPS Take 100 mg by mouth 2 (two) times daily.  Marland Kitchen acyclovir (ZOVIRAX) 400 MG tablet Take 1 tablet (400 mg total) by mouth 2 (two) times daily.  Marland Kitchen allopurinol (ZYLOPRIM) 300 MG tablet Take 1 tablet (300 mg total) by mouth daily.  Marland Kitchen ALPRAZolam (  XANAX) 0.25 MG tablet Take 0.25 mg by mouth as needed for anxiety.  Marland Kitchen b complex vitamins tablet Take 1 tablet by mouth daily.   . benzonatate (TESSALON) 100 MG capsule Take 2 capsules (200 mg total) by mouth 2 (two) times daily as needed for cough.  . Cholecalciferol (VITAMIN D-1000 MAX ST) 1000 units tablet Take 1,000 Units by mouth daily.   . famotidine (PEPCID) 20 MG tablet Take 20 mg by mouth 2 (two) times daily.   . fluticasone (FLONASE) 50 MCG/ACT nasal spray Place 2 sprays into the nose daily.   . furosemide (LASIX) 20 MG tablet Take 1 tablet (20 mg total) by mouth daily as needed.  Marland Kitchen HYDROcodone-acetaminophen (NORCO) 10-325 MG tablet Take 1 tablet by mouth See admin instructions. Every 6 to 8 hours as needed for pain  . lidocaine-prilocaine (EMLA) cream APPLY TO AFFECTED AREA ONCE (Patient taking differently: Apply 1 application topically once. )  . loperamide (IMODIUM) 2 MG capsule TAKE 2 CAPSULES BY MOUTH AS NEEDED DIARRHEA OR LOOSE STOOLS  . meclizine (ANTIVERT) 12.5 MG tablet Take 12.5 mg by mouth every 6 (six) hours as needed for dizziness.   . metFORMIN (GLUCOPHAGE-XR) 500 MG 24 hr tablet  Take 500 mg by mouth See admin instructions. Takes one tablet in AM and 2 tablets at night.  . metoprolol succinate (TOPROL-XL) 25 MG 24 hr tablet Take 1 tablet (25 mg total) by mouth daily.  . nitroGLYCERIN (NITROSTAT) 0.4 MG SL tablet PLACE 1 TABLET UNDER THE TONGUE EVERY 5 MINUTES X 3 DOSES AS NEEDED FOR CHEST PAIN *MAX 3 DOSES* (Patient taking differently: Place 0.4 mg under the tongue every 5 (five) minutes as needed for chest pain. )  . nortriptyline (PAMELOR) 25 MG capsule TAKE 4 CAPSULES BY MOUTH AT BEDTIME. (Patient taking differently: Take 100 mg by mouth at bedtime. )  . pregabalin (LYRICA) 200 MG capsule One tab three times a day and 2 tabs at night  . PROVENTIL HFA 108 (90 Base) MCG/ACT inhaler TAKE 2 PUFFS BY MOUTH EVERY 6 HOURS AS NEEDED FOR WHEEZE OR SHORTNESS OF BREATH (Patient taking differently: Inhale 2 puffs into the lungs every 6 (six) hours as needed for wheezing or shortness of breath. )  . rosuvastatin (CRESTOR) 5 MG tablet Take 1 tablet (5 mg total) by mouth daily.  . sildenafil (VIAGRA) 100 MG tablet Take 0.5-1 tablets (50-100 mg total) by mouth as needed for erectile dysfunction.  Marland Kitchen zolpidem (AMBIEN) 10 MG tablet Take 10 mg by mouth at bedtime as needed for sleep.  . [DISCONTINUED] metoprolol succinate (TOPROL-XL) 25 MG 24 hr tablet Take 0.5 tablets (12.5 mg total) by mouth daily. (Patient taking differently: Take 25 mg by mouth daily. )  . [DISCONTINUED] metoprolol succinate (TOPROL-XL) 25 MG 24 hr tablet Take 25 mg by mouth daily.  . [DISCONTINUED] rosuvastatin (CRESTOR) 5 MG tablet TAKE 1 TABLET (5 MG TOTAL) BY MOUTH DAILY.  . [DISCONTINUED] sildenafil (VIAGRA) 100 MG tablet TAKE 1/2-1 TABLET DAILY AS NEEDED FOR ERECTILE DYSFUNCTION (30 MINUTES PRIOR TO SEXUAL INTERCOURSE) (Patient taking differently: Take 50-100 mg by mouth as needed for erectile dysfunction. )     Allergies:   Codeine   Social History   Tobacco Use  . Smoking status: Former Smoker    Packs/day:  0.50    Years: 44.00    Pack years: 22.00    Types: Cigarettes    Last attempt to quit: 04/23/2016    Years since quitting: 2.1  . Smokeless  tobacco: Former Systems developer  . Tobacco comment: but does have ocassional relapses  Substance Use Topics  . Alcohol use: No    Alcohol/week: 0.0 standard drinks    Comment:  drinks non-alcoholic beer  . Drug use: No     Family Hx: The patient's family history includes Cancer in his mother; Heart disease in his father; Heart failure (age of onset: 64) in his father; Lung cancer (age of onset: 33) in his mother.  ROS:   Please see the history of present illness.   All other systems reviewed are negative.    Objective:    Vital Signs:  BP 107/87 Comment: 151/74 20 min after waking up 147/81 40 min  Pulse 74   Ht 6' (1.829 m)   Wt 207 lb 9.6 oz (94.2 kg)   BMI 28.16 kg/m    Wt Readings from Last 3 Encounters:  06/12/18 207 lb 9.6 oz (94.2 kg)  05/25/18 209 lb 12.8 oz (95.2 kg)  04/24/18 218 lb 9.6 oz (99.2 kg)    Alert male in no acute distress. Not short of breath with conversation. Appropriate in answers. His weight is down from 224 from my last visit back in October.    Labs/Other Tests and Data Reviewed:    Lab Results  Component Value Date   WBC 60.0 (HH) 05/25/2018   HGB 13.1 05/25/2018   HCT 41.4 05/25/2018   PLT 63 (L) 05/25/2018   GLUCOSE 191 (H) 05/25/2018   CHOL 146 05/24/2016   TRIG 201 (H) 05/24/2016   HDL 27 (L) 05/24/2016   LDLDIRECT 117.6 08/22/2011   LDLCALC 79 05/24/2016   ALT 14 05/25/2018   AST 15 05/25/2018   NA 139 05/25/2018   K 4.4 05/25/2018   CL 106 05/25/2018   CREATININE 0.98 05/25/2018   BUN 10 05/25/2018   CO2 23 05/25/2018   TSH 1.620 04/13/2017   INR 1.0 09/22/2014   HGBA1C 6.2 (H) 04/13/2017     BNP (last 3 results) Recent Labs    04/18/18 0315  BNP 54.0    ProBNP (last 3 results) No results for input(s): PROBNP in the last 8760 hours.    Prior CV studies:    The following  studies were reviewed today:  Coronary angiography from 09/2014:  Coronary dominance: right  Left mainstem: Normal.  Left anterior descending (LAD): The stent in the proximal LAD is widely patent throughout. There is moderate 20% narrowing in the LAD proximal to the stent. The remainder of the vessels without significant disease.  Left circumflex (LCx): The left circumflex gives rise to 2 large marginal branches. There is 20% narrowing prior to the takeoff of the first OM.  Right coronary artery (RCA): The right coronary is a dominant vessel. It has diffuse 70% disease in the proximal to mid vessel. Is occluded at the crux. There are excellent right to right and left to right collaterals.  Left ventriculography: Left ventricular systolic function is normal, LVEF is estimated at 55-65%, there is no significant mitral regurgitation  Final Conclusions:  1. Single vessel obstructive coronary disease. 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 The stent in the proximal LAD is widely patent.  2. Normal LV function.  Recommendations: Continue medical management.  Peter Martinique  08/26/2011, 9:09 AM  ASSESSMENT & PLAN:    1. CAD - last cath with stable findings back in August of 2016. He has a chronic total occlusion of the RCA with good collateral  flow - stent in the proximal LAD was patent.He is managed medically. He has had no active chest pain - would favor continued medical management. I have refilled his medicines today.   2. Swelling - may have been from the IGIG treatment - his brand had been changed per his report. He has Lasix 20 mg to use just prn and really not using. This seems much improved.   3.HTN -BP is acceptable - I have left him on his current regimen. He will continue to monitor.   4. HLD - on statin therapy - lipids are needed - will try to add on to his labs upcoming at the Cancer center.   5. Prior tobacco abuse -not  smoking.  6. CLL/chronic thrombocytopenia - followed by oncology/hematology.  7. NIDDM- per PCP  8. Obesity -has been losing weight.   9. Low testosterone level - with his known CAD - I would prefer he not be on therapy for this.We have talked about this previously. Not discussed today. He does have ED - Viagra refilled today.   10. PAD with prior L CIA PTA+S, L pop artery thrombectomy by Dr. Amedeo Plenty - has not seen [facility] since 01/2016 - he has no complaints of claudication. He cancelled his follow up there for 02/2018 - he  Has not rescheduled.    60. COVID-19 Education: The signs and symptoms of COVID-19 were discussed with the patient and how to seek care for testing (follow up with PCP or arrange E-visit).  The importance of social distancing, staying at home and hand hygiene were discussed today.  Patient Risk:   After full review of this patient's clinical status, I feel that they are at least moderate risk at this time.  Time:   Today, I have spent 15 minutes with the patient with telehealth technology discussing the above issues.     Medication Adjustments/Labs and Tests Ordered: Current medicines are reviewed at length with the patient today.  Concerns regarding medicines are outlined above.   Tests Ordered: Orders Placed This Encounter  Procedures  . Lipid panel    Medication Changes: Meds ordered this encounter  Medications  . rosuvastatin (CRESTOR) 5 MG tablet    Sig: Take 1 tablet (5 mg total) by mouth daily.    Dispense:  90 tablet    Refill:  3    Order Specific Question:   Supervising Provider    Answer:   Martinique, PETER M [[id number]]  . sildenafil (VIAGRA) 100 MG tablet    Sig: Take 0.5-1 tablets (50-100 mg total) by mouth as needed for erectile dysfunction.    Dispense:  10 tablet    Refill:  3    Order Specific Question:   Supervising Provider    Answer:   Martinique, PETER M [[id number]]  . metoprolol succinate (TOPROL-XL) 25 MG 24 hr tablet    Sig: Take 1 tablet  (25 mg total) by mouth daily.    Dispense:  90 tablet    Refill:  3    Order Specific Question:   Supervising Provider    Answer:   Martinique, PETER M [[id number]]    Disposition:  FU with me in 6 months.   Patient is agreeable to this plan and will call if any problems develop in the interim.   Amie Critchley, NP  06/12/2018 8:37 AM    [place]

## 2018-06-12 ENCOUNTER — Telehealth (INDEPENDENT_AMBULATORY_CARE_PROVIDER_SITE_OTHER): Payer: Medicare Other | Admitting: Nurse Practitioner

## 2018-06-12 ENCOUNTER — Encounter: Payer: Self-pay | Admitting: Nurse Practitioner

## 2018-06-12 ENCOUNTER — Other Ambulatory Visit: Payer: Self-pay

## 2018-06-12 ENCOUNTER — Telehealth: Payer: Self-pay | Admitting: *Deleted

## 2018-06-12 VITALS — BP 107/87 | HR 74 | Ht 72.0 in | Wt 207.6 lb

## 2018-06-12 DIAGNOSIS — I2510 Atherosclerotic heart disease of native coronary artery without angina pectoris: Secondary | ICD-10-CM

## 2018-06-12 DIAGNOSIS — R609 Edema, unspecified: Secondary | ICD-10-CM

## 2018-06-12 DIAGNOSIS — I259 Chronic ischemic heart disease, unspecified: Secondary | ICD-10-CM

## 2018-06-12 DIAGNOSIS — E7800 Pure hypercholesterolemia, unspecified: Secondary | ICD-10-CM

## 2018-06-12 DIAGNOSIS — Z7189 Other specified counseling: Secondary | ICD-10-CM

## 2018-06-12 DIAGNOSIS — I739 Peripheral vascular disease, unspecified: Secondary | ICD-10-CM

## 2018-06-12 DIAGNOSIS — I10 Essential (primary) hypertension: Secondary | ICD-10-CM

## 2018-06-12 MED ORDER — METOPROLOL SUCCINATE ER 25 MG PO TB24: 25.0000 mg | ORAL_TABLET | Freq: Every day | ORAL | 3 refills | Status: DC

## 2018-06-12 MED ORDER — SILDENAFIL CITRATE 100 MG PO TABS: 50.0000 mg | ORAL_TABLET | ORAL | 3 refills | Status: DC | PRN

## 2018-06-12 MED ORDER — ROSUVASTATIN CALCIUM 5 MG PO TABS: 5.0000 mg | ORAL_TABLET | Freq: Every day | ORAL | 3 refills | Status: DC

## 2018-06-12 NOTE — Progress Notes (Signed)
Had to mail patient RX for lipid panel to be [facility].

## 2018-06-12 NOTE — Patient Instructions (Addendum)
After Visit Summary:  We will be checking the following labs today - NONE  Will add lipids to your lab visit at the [facility] in May   Medication Instructions:    Continue with your current medicines.   I have sent in your refills today.    If you need a refill on your cardiac medications before your next appointment, please call your pharmacy.     Testing/Procedures To Be Arranged:  N/A  Follow-Up:   See me in 6 months    At [facility], you and your health needs are our priority.  As part of our continuing mission to provide you with exceptional heart care, we have created designated Provider Care Teams.  These Care Teams include your primary Cardiologist (physician) and Advanced Practice Providers (APPs -  Physician Assistants and Nurse Practitioners) who all work together to provide you with the care you need, when you need it.  Special Instructions:  . Stay safe, stay home and wash your hands for at least 20 seconds! . It was good to see you today.   Call the [place] office at [phone] if you have any questions, problems or concerns.

## 2018-06-12 NOTE — Telephone Encounter (Signed)
Pt is getting labs drawn on  May 8 at [place] office and had a [place] visit today with Truitt Merle, NP.  Pt wanted today's labs ( lipid panel) with [name] drawn at [facility] office due to pandemic.  Called Gorsch's office and stated pt needs an order.  Lori mailed pt's  Order for lipid from house today and pt is aware order needs to be brought to Dr.Gorsch's office get drawn.

## 2018-06-22 ENCOUNTER — Inpatient Hospital Stay (HOSPITAL_BASED_OUTPATIENT_CLINIC_OR_DEPARTMENT_OTHER): Payer: Medicare Other | Admitting: Hematology and Oncology

## 2018-06-22 ENCOUNTER — Other Ambulatory Visit: Payer: Self-pay

## 2018-06-22 ENCOUNTER — Encounter: Payer: Self-pay | Admitting: Hematology and Oncology

## 2018-06-22 ENCOUNTER — Inpatient Hospital Stay: Payer: Medicare Other | Attending: Hematology and Oncology

## 2018-06-22 ENCOUNTER — Inpatient Hospital Stay: Payer: Medicare Other

## 2018-06-22 ENCOUNTER — Other Ambulatory Visit: Payer: Self-pay | Admitting: *Deleted

## 2018-06-22 ENCOUNTER — Other Ambulatory Visit: Payer: Self-pay | Admitting: Nurse Practitioner

## 2018-06-22 DIAGNOSIS — I10 Essential (primary) hypertension: Secondary | ICD-10-CM | POA: Diagnosis not present

## 2018-06-22 DIAGNOSIS — Z9221 Personal history of antineoplastic chemotherapy: Secondary | ICD-10-CM | POA: Insufficient documentation

## 2018-06-22 DIAGNOSIS — R161 Splenomegaly, not elsewhere classified: Secondary | ICD-10-CM | POA: Insufficient documentation

## 2018-06-22 DIAGNOSIS — Z885 Allergy status to narcotic agent status: Secondary | ICD-10-CM | POA: Insufficient documentation

## 2018-06-22 DIAGNOSIS — I2510 Atherosclerotic heart disease of native coronary artery without angina pectoris: Secondary | ICD-10-CM | POA: Diagnosis not present

## 2018-06-22 DIAGNOSIS — R42 Dizziness and giddiness: Secondary | ICD-10-CM | POA: Diagnosis not present

## 2018-06-22 DIAGNOSIS — C8308 Small cell B-cell lymphoma, lymph nodes of multiple sites: Secondary | ICD-10-CM

## 2018-06-22 DIAGNOSIS — D696 Thrombocytopenia, unspecified: Secondary | ICD-10-CM | POA: Insufficient documentation

## 2018-06-22 DIAGNOSIS — D61818 Other pancytopenia: Secondary | ICD-10-CM | POA: Diagnosis not present

## 2018-06-22 DIAGNOSIS — Z95828 Presence of other vascular implants and grafts: Secondary | ICD-10-CM

## 2018-06-22 DIAGNOSIS — Z79899 Other long term (current) drug therapy: Secondary | ICD-10-CM | POA: Diagnosis not present

## 2018-06-22 DIAGNOSIS — D801 Nonfamilial hypogammaglobulinemia: Secondary | ICD-10-CM

## 2018-06-22 LAB — COMPREHENSIVE METABOLIC PANEL
ALT: 16 U/L (ref 0–44)
AST: 13 U/L — ABNORMAL LOW (ref 15–41)
Albumin: 4 g/dL (ref 3.5–5.0)
Alkaline Phosphatase: 101 U/L (ref 38–126)
Anion gap: 7 (ref 5–15)
BUN: 11 mg/dL (ref 8–23)
CO2: 25 mmol/L (ref 22–32)
Calcium: 9 mg/dL (ref 8.9–10.3)
Chloride: 107 mmol/L (ref 98–111)
Creatinine, Ser: 0.94 mg/dL (ref 0.61–1.24)
GFR calc Af Amer: >60 mL/min (ref >60)
GFR calc non Af Amer: >60 mL/min (ref >60)
Glucose, Bld: 155 mg/dL — ABNORMAL HIGH (ref 70–99)
Potassium: 4.3 mmol/L (ref 3.5–5.1)
Sodium: 139 mmol/L (ref 135–145)
Total Bilirubin: 0.5 mg/dL (ref 0.3–1.2)
Total Protein: 6.3 g/dL — ABNORMAL LOW (ref 6.5–8.1)

## 2018-06-22 LAB — CBC WITH DIFFERENTIAL/PLATELET
Abs Immature Granulocytes: 0.17 10*3/uL — ABNORMAL HIGH (ref 0.00–0.07)
Basophils Absolute: 0 10*3/uL (ref 0.0–0.1)
Basophils Relative: 0 %
Eosinophils Absolute: 0.1 10*3/uL (ref 0.0–0.5)
Eosinophils Relative: 0 %
HCT: 41.1 % (ref 39.0–52.0)
Hemoglobin: 13.2 g/dL (ref 13.0–17.0)
Immature Granulocytes: 0 %
Lymphocytes Relative: 90 %
Lymphs Abs: 48.6 10*3/uL — ABNORMAL HIGH (ref 0.7–4.0)
MCH: 34.6 pg — ABNORMAL HIGH (ref 26.0–34.0)
MCHC: 32.1 g/dL (ref 30.0–36.0)
MCV: 107.6 fL — ABNORMAL HIGH (ref 80.0–100.0)
Monocytes Absolute: 0.6 10*3/uL (ref 0.1–1.0)
Monocytes Relative: 1 %
Neutro Abs: 4.8 10*3/uL (ref 1.7–7.7)
Neutrophils Relative %: 9 %
Platelets: 60 10*3/uL — ABNORMAL LOW (ref 150–400)
RBC: 3.82 MIL/uL — ABNORMAL LOW (ref 4.22–5.81)
RDW: 18.9 % — ABNORMAL HIGH (ref 11.5–15.5)
WBC: 54.2 10*3/uL (ref 4.0–10.5)
nRBC: 0.1 % (ref 0.0–0.2)

## 2018-06-22 LAB — URIC ACID: Uric Acid, Serum: 4.6 mg/dL (ref 3.7–8.6)

## 2018-06-22 LAB — LACTATE DEHYDROGENASE: LDH: 204 U/L — ABNORMAL HIGH (ref 98–192)

## 2018-06-22 MED ORDER — HEPARIN SOD (PORK) LOCK FLUSH 100 UNIT/ML IV SOLN
250.0000 [IU] | Freq: Once | INTRAVENOUS | Status: AC
  Administered 2018-06-22: 250 [IU]
  Filled 2018-06-22: qty 5

## 2018-06-22 MED ORDER — SODIUM CHLORIDE 0.9% FLUSH
10.0000 mL | Freq: Once | INTRAVENOUS | Status: AC
  Administered 2018-06-22: 10 mL
  Filled 2018-06-22: qty 10

## 2018-06-22 NOTE — Progress Notes (Signed)
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 [facility]) He tolerated treatment well The lymphocytosis is expected There are no palpable lymphadenopathy on exam He has some bruising which is not unexpected We will proceed with treatment without dose adjustment I will see him again in a month for further follow-up I plan to repeat imaging study after resolution of lymphocytosis  Essential hypertension He is taking metoprolol Her blood pressure is borderline low and the patient have symptoms of dizziness EKG today is within normal limits I recommend close follow-up with his cardiologist for medication adjustment  Thrombocytopenia ([place]) He has persistent pancytopenia due CLL, liver disease and chronic splenomegaly He is not symptomatic except for minor bruising.  Observe only for now.   No orders of the defined types were placed in this encounter.   INTERVAL HISTORY: Please see below for problem oriented charting. He returns for chemo follow-up He complained of intermittent postural dizziness in the morning Denies recent fall No recent chest pain or shortness of breath No recent infection, fever or chills The patient denies any recent signs or symptoms of bleeding such as spontaneous epistaxis, hematuria or hematochezia.   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  No evidence of aortic dissection or other acute process in the chest. 2.  Centrilobular emphysema with a 5 mm right lung nodule. Given the concurrent centrilobular  emphysema, follow-up chest CT at 6 -12 months is recommended.  3.  Coronary artery atherosclerosis which is age advanced. 4.  Prominent thoracic lymph nodes.  These can be reevaluated at follow-up.    05/06/2010 Imaging    1.  Multiple small periaortic lymph nodes consistent with the patient's history of the chronic lymphocytic leukemia. 2.  No evidence of solid organ involvement    11/03/2011 Imaging    1.  Interval progression of abdominal and pelvic adenopathy. 2.  Progression of splenomegaly.  The spleen now measures 23 cm in length    11/18/2011 Bone Marrow Biopsy    Bone Marrow, Aspirate,Biopsy, and Clot, right iliac bone - HYPERCELLULAR BONE MARROW WITH EXTENSIVE INVOLVEMENT BY CHRONIC LYMPHOCYTIC LEUKEMIA. PERIPHERAL BLOOD: - CHRONIC LYMPHOCYTIC LEUKEMIA    03/08/2012 Imaging    1.  Progressive increase in retroperitoneal, iliac, and inguinal lymphadenopathy. 2.  Interval increase in massive splenomegaly.     06/01/2012 Procedure    Placement of single lumen port a cath via right internal jugular vein.  The catheter tip lies at the cavoatrial junction.  A power injectable port a cath was placed and is ready for immediate use    06/20/2012 - 11/23/2012 Chemotherapy    He received FCR x 6 cycles    12/19/2012 Imaging    Left common iliac stent. Abdominal vasculature remains patent. Improving supraclavicular and axillary lymphadenopathy. Residual right subpectoral nodes measure up to 10 mm short axis. Improving retroperitoneal lymphadenopathy, measuring up to 16 mm short axis. Improving splenomegaly, measuring 18.7 cm.     01/20/2016 Imaging    1. Stable exam.  No new or progressive findings. 2. No CT findings to  explain odynophagia    09/02/2016 Pathology Results    The findings are consistent with involvement by previously known chronic lymphocytic leukemia    09/02/2016 Pathology Results    FISH for CLL came back positive for deletion 13q    09/15/2016 Imaging    1.  Borderline enlarged abdominal and pelvic lymph nodes. Compared with 11/03/2011 these are decreased in size as detailed above. 2. Persistent splenomegaly. 3. Aortic Atherosclerosis (ICD10-I70.0). LAD coronary artery calcification noted.    10/06/2016 - 02/24/2017 Chemotherapy    He received Bendamustine and Rituxan    11/03/2016 Adverse Reaction    Dose of Bendamustine is reduced due to severe pancytopenia    12/28/2016 Imaging    1. Borderline enlarged abdominal peritoneal ligament and abdominal retroperitoneal lymph nodes, stable. 2. Splenomegaly. 3.  Aortic atherosclerosis (ICD10-170.0).    03/22/2017 PET scan    1. No hypermetabolic adenopathy identified within the neck, chest, abdomen or pelvis. 2. Prominent left retroperitoneal node measures 1.6 cm without significant FDG uptake. 3. Splenomegaly. 4. Aortic Atherosclerosis (ICD10-I70.0) and Emphysema (ICD10-J43.9). LAD and left circumflex atherosclerotic calcifications noted.    02/02/2018 Procedure    IMPRESSION: Widely patent right IJ power port catheter.    04/06/2018 Imaging    1. Interval enlargement of axillary, mediastinal, and retroperitoneal lymph nodes, as well as increased splenomegaly, findings concerning for progression of CLL in comparison to prior PET-CT dated 03/22/2017.  2.  Other chronic and incidental findings as detailed above.     04/16/2018 -  Chemotherapy    The patient had acalabrutinib for chemo    04/17/2018 - 04/21/2018 Hospital Admission    He was hospitalized for influenza     REVIEW OF SYSTEMS:   Constitutional: Denies fevers, chills or abnormal weight loss Eyes: Denies blurriness of vision Ears, nose, mouth, throat, and face: Denies mucositis or sore throat Respiratory: Denies cough, dyspnea or wheezes Cardiovascular: Denies palpitation, chest discomfort or lower extremity swelling Gastrointestinal:  Denies nausea, heartburn or change in bowel habits Skin: Denies abnormal skin rashes Lymphatics:  Denies new lymphadenopathy  Neurological:Denies numbness, tingling or new weaknesses Behavioral/Psych: Mood is stable, no new changes  All other systems were reviewed with the patient and are negative.  I have reviewed the past medical history, past surgical history, social history and family history with the patient and they are unchanged from previous note.  ALLERGIES:  is allergic to codeine.  MEDICATIONS:  Current Outpatient Medications  Medication Sig Dispense Refill  . Acalabrutinib 100 MG CAPS Take 100 mg by mouth 2 (two) times daily.    Marland Kitchen acyclovir (ZOVIRAX) 400 MG tablet Take 1 tablet (400 mg total) by mouth 2 (two) times daily. 180 tablet 1  . allopurinol (ZYLOPRIM) 300 MG tablet Take 1 tablet (300 mg total) by mouth daily. 30 tablet 5  . ALPRAZolam (XANAX) 0.25 MG tablet Take 0.25 mg by mouth as needed for anxiety.    Marland Kitchen b complex vitamins tablet Take 1 tablet by mouth daily.     . benzonatate (TESSALON) 100 MG capsule Take 2 capsules (200 mg total) by mouth 2 (two) times daily as needed for cough. 20 capsule 0  . Cholecalciferol (VITAMIN D-1000 MAX ST) 1000 units tablet Take 1,000 Units by mouth daily.     . famotidine (PEPCID) 20 MG tablet Take 20 mg by mouth 2 (two) times daily.     . fluticasone (FLONASE) 50 MCG/ACT nasal spray Place 2 sprays into the nose daily.     . furosemide (  LASIX) 20 MG tablet Take 1 tablet (20 mg total) by mouth daily as needed. 30 tablet 3  . HYDROcodone-acetaminophen (NORCO) 10-325 MG tablet Take 1 tablet by mouth See admin instructions. Every 6 to 8 hours as needed for pain  0  . lidocaine-prilocaine (EMLA) cream APPLY TO AFFECTED AREA ONCE (Patient taking differently: Apply 1 application topically once. ) 30 g 3  . loperamide (IMODIUM) 2 MG capsule TAKE 2 CAPSULES BY MOUTH AS NEEDED DIARRHEA OR LOOSE STOOLS    . meclizine (ANTIVERT) 12.5 MG tablet Take 12.5 mg by mouth every 6 (six) hours as needed for dizziness.   0  . metFORMIN (GLUCOPHAGE-XR)  500 MG 24 hr tablet Take 500 mg by mouth See admin instructions. Takes one tablet in AM and 2 tablets at night.    . metoprolol succinate (TOPROL-XL) 25 MG 24 hr tablet Take 1 tablet (25 mg total) by mouth daily. 90 tablet 3  . nitroGLYCERIN (NITROSTAT) 0.4 MG SL tablet PLACE 1 TABLET UNDER THE TONGUE EVERY 5 MINUTES X 3 DOSES AS NEEDED FOR CHEST PAIN *MAX 3 DOSES* (Patient taking differently: Place 0.4 mg under the tongue every 5 (five) minutes as needed for chest pain. ) 25 tablet 3  . nortriptyline (PAMELOR) 25 MG capsule TAKE 4 CAPSULES BY MOUTH AT BEDTIME. (Patient taking differently: Take 100 mg by mouth at bedtime. ) 360 capsule 1  . pregabalin (LYRICA) 200 MG capsule One tab three times a day and 2 tabs at night 150 capsule 5  . PROVENTIL HFA 108 (90 Base) MCG/ACT inhaler TAKE 2 PUFFS BY MOUTH EVERY 6 HOURS AS NEEDED FOR WHEEZE OR SHORTNESS OF BREATH (Patient taking differently: Inhale 2 puffs into the lungs every 6 (six) hours as needed for wheezing or shortness of breath. ) 20.1 Inhaler 2  . rosuvastatin (CRESTOR) 5 MG tablet Take 1 tablet (5 mg total) by mouth daily. 90 tablet 3  . sildenafil (VIAGRA) 100 MG tablet Take 0.5-1 tablets (50-100 mg total) by mouth as needed for erectile dysfunction. 10 tablet 3  . zolpidem (AMBIEN) 10 MG tablet Take 10 mg by mouth at bedtime as needed for sleep.     No current facility-administered medications for this visit.    Facility-Administered Medications Ordered in Other Visits  Medication Dose Route Frequency Provider Last Rate Last Dose  . 0.9 %  sodium chloride infusion   Intravenous Continuous Alvy Bimler, Jahshua Bonito, MD 50 mL/hr at 03/07/14 1005    . sodium chloride 0.9 % injection 10 mL  10 mL Intracatheter PRN Marcy Panning, MD   10 mL at 08/22/12 1721    PHYSICAL EXAMINATION: ECOG PERFORMANCE STATUS: 1 - Symptomatic but completely ambulatory  Vitals:   06/22/18 0936  BP: 110/66  Pulse: 63  Resp: 18  Temp: 98.2 F (36.8 C)  SpO2: 100%   Filed  Weights   06/22/18 0936  Weight: 208 lb 6.4 oz (94.5 kg)    GENERAL:alert, no distress and comfortable SKIN: skin color, texture, turgor are normal, no rashes or significant lesions EYES: normal, Conjunctiva are pink and non-injected, sclera clear OROPHARYNX:no exudate, no erythema and lips, buccal mucosa, and tongue normal  NECK: supple, thyroid normal size, non-tender, without nodularity LYMPH:  no palpable lymphadenopathy in the cervical, axillary or inguinal LUNGS: clear to auscultation and percussion with normal breathing effort HEART: regular rate & rhythm and no murmurs and no lower extremity edema ABDOMEN:abdomen soft, non-tender and normal bowel sounds Musculoskeletal:no cyanosis of digits and no clubbing  NEURO:  alert & oriented x 3 with fluent speech, no focal motor/sensory deficits  LABORATORY DATA:  I have reviewed the data as listed    Component Value Date/Time   NA 139 06/22/2018 0844   NA 136 01/26/2017 0940   K 4.3 06/22/2018 0844   K 4.0 01/26/2017 0940   CL 107 06/22/2018 0844   CL 104 08/03/2012 1229   CO2 25 06/22/2018 0844   CO2 23 01/26/2017 0940   GLUCOSE 155 (H) 06/22/2018 0844   GLUCOSE 146 (H) 01/26/2017 0940   GLUCOSE 223 (H) 08/03/2012 1229   BUN 11 06/22/2018 0844   BUN 9.6 01/26/2017 0940   CREATININE 0.94 06/22/2018 0844   CREATININE 1.04 04/24/2018 0821   CREATININE 1.0 01/26/2017 0940   CALCIUM 9.0 06/22/2018 0844   CALCIUM 8.7 01/26/2017 0940   PROT 6.3 (L) 06/22/2018 0844   PROT 5.9 (L) 04/13/2017 0823   PROT 5.7 (L) 01/26/2017 0940   ALBUMIN 4.0 06/22/2018 0844   ALBUMIN 3.6 01/26/2017 0940   AST 13 (L) 06/22/2018 0844   AST 13 (L) 04/24/2018 0821   AST 13 01/26/2017 0940   ALT 16 06/22/2018 0844   ALT 10 04/24/2018 0821   ALT 12 01/26/2017 0940   ALKPHOS 101 06/22/2018 0844   ALKPHOS 78 01/26/2017 0940   BILITOT 0.5 06/22/2018 0844   BILITOT 0.7 04/24/2018 0821   BILITOT 0.64 01/26/2017 0940   GFRNONAA >60 06/22/2018 0844    GFRNONAA >60 04/24/2018 0821   GFRAA >60 06/22/2018 0844   GFRAA >60 04/24/2018 0821    No results found for: SPEP, UPEP  Lab Results  Component Value Date   WBC 54.2 (HH) 06/22/2018   NEUTROABS 4.8 06/22/2018   HGB 13.2 06/22/2018   HCT 41.1 06/22/2018   MCV 107.6 (H) 06/22/2018   PLT 60 (L) 06/22/2018      Chemistry      Component Value Date/Time   NA 139 06/22/2018 0844   NA 136 01/26/2017 0940   K 4.3 06/22/2018 0844   K 4.0 01/26/2017 0940   CL 107 06/22/2018 0844   CL 104 08/03/2012 1229   CO2 25 06/22/2018 0844   CO2 23 01/26/2017 0940   BUN 11 06/22/2018 0844   BUN 9.6 01/26/2017 0940   CREATININE 0.94 06/22/2018 0844   CREATININE 1.04 04/24/2018 0821   CREATININE 1.0 01/26/2017 0940      Component Value Date/Time   CALCIUM 9.0 06/22/2018 0844   CALCIUM 8.7 01/26/2017 0940   ALKPHOS 101 06/22/2018 0844   ALKPHOS 78 01/26/2017 0940   AST 13 (L) 06/22/2018 0844   AST 13 (L) 04/24/2018 0821   AST 13 01/26/2017 0940   ALT 16 06/22/2018 0844   ALT 10 04/24/2018 0821   ALT 12 01/26/2017 0940   BILITOT 0.5 06/22/2018 0844   BILITOT 0.7 04/24/2018 0821   BILITOT 0.64 01/26/2017 0940       All questions were answered. The patient knows to call the clinic with any problems, questions or concerns. No barriers to learning was detected.  I spent 15 minutes counseling the patient face to face. The total time spent in the appointment was 20 minutes and more than 50% was on counseling and review of test results  [name], MD 06/22/2018 11:30 AM

## 2018-06-22 NOTE — Assessment & Plan Note (Signed)
He is taking metoprolol Her blood pressure is borderline low and the patient have symptoms of dizziness EKG today is within normal limits I recommend close follow-up with his cardiologist for medication adjustment

## 2018-06-22 NOTE — Progress Notes (Signed)
Critical value received from lab.  WBC 54.2.  Secure chat sent to Dr. Alvy Bimler.  Value acknowledged by MD

## 2018-06-22 NOTE — Assessment & Plan Note (Signed)
He has persistent pancytopenia due CLL, liver disease and chronic splenomegaly He is not symptomatic except for minor bruising.  Observe only for now.

## 2018-06-22 NOTE — Assessment & Plan Note (Signed)
He tolerated treatment well The lymphocytosis is expected There are no palpable lymphadenopathy on exam He has some bruising which is not unexpected We will proceed with treatment without dose adjustment I will see him again in a month for further follow-up I plan to repeat imaging study after resolution of lymphocytosis

## 2018-06-25 ENCOUNTER — Telehealth: Payer: Self-pay | Admitting: *Deleted

## 2018-06-25 ENCOUNTER — Telehealth: Payer: Self-pay | Admitting: Hematology and Oncology

## 2018-06-25 NOTE — Telephone Encounter (Signed)
Scheduled June appt per sch msg. Called and spoke with patient. Confirmed date and time

## 2018-06-25 NOTE — Telephone Encounter (Signed)
lvm pt can have lipids drawn at oct visit.  Instructions were given to fast day of appt. Lipids added to appt notes.

## 2018-06-25 NOTE — Telephone Encounter (Signed)
Called pt to verify if pt had labs.  Labs are in [place].  Sent [name] a message to review.

## 2018-06-26 ENCOUNTER — Other Ambulatory Visit: Payer: Self-pay | Admitting: *Deleted

## 2018-06-26 ENCOUNTER — Telehealth: Payer: Self-pay | Admitting: *Deleted

## 2018-06-26 LAB — LIPID PANEL W/O CHOL/HDL RATIO
Cholesterol, Total: 72 mg/dL — ABNORMAL LOW (ref 100–199)
HDL: 24 mg/dL — ABNORMAL LOW (ref >39)
LDL Calculated: 30 mg/dL (ref 0–99)
Triglycerides: 89 mg/dL (ref 0–149)
VLDL Cholesterol Cal: 18 mg/dL (ref 5–40)

## 2018-06-26 LAB — SPECIMEN STATUS REPORT

## 2018-06-26 MED ORDER — METOPROLOL SUCCINATE ER 25 MG PO TB24: 12.5000 mg | ORAL_TABLET | Freq: Every day | ORAL | 3 refills | Status: DC

## 2018-06-26 NOTE — Telephone Encounter (Signed)
S/w pt regarding last conversation noted on labs.  Pt will decrease toprol one half tablet (12.5 mg) daily and keep a check on bp.  Pt will mychart readings in a few weeks.

## 2018-07-01 ENCOUNTER — Other Ambulatory Visit: Payer: Self-pay | Admitting: Neurology

## 2018-07-05 ENCOUNTER — Other Ambulatory Visit: Payer: Self-pay | Admitting: Nurse Practitioner

## 2018-07-12 MED FILL — CALQUENCE 100 MG CAPSULE: 100 | 30 days supply | Qty: 60 | Fill #3

## 2018-07-18 ENCOUNTER — Telehealth: Payer: Self-pay

## 2018-07-18 NOTE — Telephone Encounter (Signed)
Oral Oncology Patient Advocate Encounter   Was successful in securing patient an $55 grant from [facility] [employer] [facility]) to provide copayment coverage for [place].  This will keep the out of pocket expense at $0.     I have spoken with the patient.    The billing information is as follows and has been shared with [place].   Member ID: [id number] Group ID: [id number] RxBin: [id number] Dates of Eligibility: 04/19/18 through 07/17/19  [place] [facility] Phone [phone] Fax [phone] 07/18/2018    1:44 PM

## 2018-07-21 ENCOUNTER — Other Ambulatory Visit: Payer: Self-pay | Admitting: Hematology and Oncology

## 2018-07-21 DIAGNOSIS — C9110 Chronic lymphocytic leukemia of B-cell type not having achieved remission: Secondary | ICD-10-CM

## 2018-08-03 ENCOUNTER — Inpatient Hospital Stay: Payer: Medicare Other | Attending: Hematology and Oncology | Admitting: Hematology and Oncology

## 2018-08-03 ENCOUNTER — Inpatient Hospital Stay: Payer: Medicare Other

## 2018-08-03 ENCOUNTER — Encounter: Payer: Self-pay | Admitting: Hematology and Oncology

## 2018-08-03 ENCOUNTER — Other Ambulatory Visit: Payer: Self-pay

## 2018-08-03 DIAGNOSIS — D801 Nonfamilial hypogammaglobulinemia: Secondary | ICD-10-CM

## 2018-08-03 DIAGNOSIS — I259 Chronic ischemic heart disease, unspecified: Secondary | ICD-10-CM | POA: Insufficient documentation

## 2018-08-03 DIAGNOSIS — I10 Essential (primary) hypertension: Secondary | ICD-10-CM | POA: Insufficient documentation

## 2018-08-03 DIAGNOSIS — C8308 Small cell B-cell lymphoma, lymph nodes of multiple sites: Secondary | ICD-10-CM

## 2018-08-03 DIAGNOSIS — R197 Diarrhea, unspecified: Secondary | ICD-10-CM | POA: Diagnosis not present

## 2018-08-03 DIAGNOSIS — D696 Thrombocytopenia, unspecified: Secondary | ICD-10-CM | POA: Insufficient documentation

## 2018-08-03 DIAGNOSIS — Z95828 Presence of other vascular implants and grafts: Secondary | ICD-10-CM

## 2018-08-03 LAB — CBC WITH DIFFERENTIAL/PLATELET
Abs Immature Granulocytes: 0.18 10*3/uL — ABNORMAL HIGH (ref 0.00–0.07)
Basophils Absolute: 0.1 10*3/uL (ref 0.0–0.1)
Basophils Relative: 0 %
Eosinophils Absolute: 0.1 10*3/uL (ref 0.0–0.5)
Eosinophils Relative: 0 %
HCT: 38.9 % — ABNORMAL LOW (ref 39.0–52.0)
Hemoglobin: 12.8 g/dL — ABNORMAL LOW (ref 13.0–17.0)
Immature Granulocytes: 1 %
Lymphocytes Relative: 84 %
Lymphs Abs: 25.4 10*3/uL — ABNORMAL HIGH (ref 0.7–4.0)
MCH: 35.6 pg — ABNORMAL HIGH (ref 26.0–34.0)
MCHC: 32.9 g/dL (ref 30.0–36.0)
MCV: 108.1 fL — ABNORMAL HIGH (ref 80.0–100.0)
Monocytes Absolute: 0.4 10*3/uL (ref 0.1–1.0)
Monocytes Relative: 1 %
Neutro Abs: 4.3 10*3/uL (ref 1.7–7.7)
Neutrophils Relative %: 14 %
Platelets: 64 10*3/uL — ABNORMAL LOW (ref 150–400)
RBC: 3.6 MIL/uL — ABNORMAL LOW (ref 4.22–5.81)
RDW: 16.9 % — ABNORMAL HIGH (ref 11.5–15.5)
WBC: 30.4 10*3/uL — ABNORMAL HIGH (ref 4.0–10.5)
nRBC: 0 % (ref 0.0–0.2)

## 2018-08-03 LAB — COMPREHENSIVE METABOLIC PANEL
ALT: 16 U/L (ref 0–44)
AST: 16 U/L (ref 15–41)
Albumin: 3.8 g/dL (ref 3.5–5.0)
Alkaline Phosphatase: 105 U/L (ref 38–126)
Anion gap: 7 (ref 5–15)
BUN: 10 mg/dL (ref 8–23)
CO2: 25 mmol/L (ref 22–32)
Calcium: 8.5 mg/dL — ABNORMAL LOW (ref 8.9–10.3)
Chloride: 106 mmol/L (ref 98–111)
Creatinine, Ser: 0.86 mg/dL (ref 0.61–1.24)
GFR calc Af Amer: >60 mL/min (ref >60)
GFR calc non Af Amer: >60 mL/min (ref >60)
Glucose, Bld: 165 mg/dL — ABNORMAL HIGH (ref 70–99)
Potassium: 4.3 mmol/L (ref 3.5–5.1)
Sodium: 138 mmol/L (ref 135–145)
Total Bilirubin: 0.5 mg/dL (ref 0.3–1.2)
Total Protein: 5.9 g/dL — ABNORMAL LOW (ref 6.5–8.1)

## 2018-08-03 LAB — URIC ACID: Uric Acid, Serum: 4.5 mg/dL (ref 3.7–8.6)

## 2018-08-03 LAB — LACTATE DEHYDROGENASE: LDH: 206 U/L — ABNORMAL HIGH (ref 98–192)

## 2018-08-03 MED ORDER — SODIUM CHLORIDE 0.9% FLUSH
10.0000 mL | Freq: Once | INTRAVENOUS | Status: AC
  Administered 2018-08-03: 10 mL
  Filled 2018-08-03: qty 10

## 2018-08-03 MED ORDER — HEPARIN SOD (PORK) LOCK FLUSH 100 UNIT/ML IV SOLN
500.0000 [IU] | Freq: Once | INTRAVENOUS | Status: AC
  Administered 2018-08-03: 500 [IU]
  Filled 2018-08-03: qty 5

## 2018-08-03 NOTE — Assessment & Plan Note (Signed)
He tolerated treatment well The lymphocytosis is expected and continues to improve There are no palpable lymphadenopathy on exam He has some bruising which is not unexpected We will proceed with treatment without dose adjustment I will see him again in 2 months for further follow-up I plan to repeat imaging study after resolution of lymphocytosis

## 2018-08-03 NOTE — Assessment & Plan Note (Signed)
He has mild chronic diarrhea, well controlled with Imodium as needed He will continue to same

## 2018-08-03 NOTE — Progress Notes (Signed)
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  Observe only for now.  Essential hypertension He has occasional dizziness I recommend trial of switching his metoprolol to take in the evening instead  Diarrhea He has mild chronic diarrhea, well controlled with Imodium as needed He will continue to same   No orders of the defined types were placed in this encounter.   INTERVAL HISTORY: Please see below for problem oriented charting. He returns for further follow-up He feels well He is getting to retired in a few weeks No recent infection, fever or chills The patient denies any recent signs or symptoms of bleeding such as spontaneous epistaxis, hematuria or hematochezia. 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  No evidence of aortic dissection or  other acute process in the chest. 2.  Centrilobular emphysema with a 5 mm right lung nodule. Given the concurrent centrilobular emphysema, follow-up chest CT at 6 -12 months is recommended.  3.  Coronary artery atherosclerosis which is age advanced. 4.  Prominent thoracic lymph nodes.  These can be reevaluated at follow-up.   05/06/2010 Imaging   1.  Multiple small periaortic lymph nodes consistent with the patient's history of the chronic lymphocytic leukemia. 2.  No evidence of solid organ involvement   11/03/2011 Imaging   1.  Interval progression of abdominal and pelvic adenopathy. 2.  Progression of splenomegaly.  The spleen now measures 23 cm in length   11/18/2011 Bone Marrow Biopsy   Bone Marrow, Aspirate,Biopsy, and Clot, right iliac bone - HYPERCELLULAR BONE MARROW WITH EXTENSIVE INVOLVEMENT BY CHRONIC LYMPHOCYTIC LEUKEMIA. PERIPHERAL BLOOD: - CHRONIC LYMPHOCYTIC LEUKEMIA   03/08/2012 Imaging   1.  Progressive increase in retroperitoneal, iliac, and inguinal lymphadenopathy. 2.  Interval increase in massive splenomegaly.    06/01/2012 Procedure   Placement of single lumen port a cath via right internal jugular vein.  The catheter tip lies at the cavoatrial junction.  A power injectable port a cath was placed and is ready for immediate use   06/20/2012 - 11/23/2012 Chemotherapy   He received FCR x 6 cycles   12/19/2012 Imaging   Left common iliac stent. Abdominal vasculature remains patent. Improving supraclavicular and axillary lymphadenopathy. Residual right subpectoral nodes measure up to 10 mm short axis. Improving retroperitoneal lymphadenopathy, measuring up to 16 mm short axis. Improving splenomegaly, measuring 18.7 cm.    01/20/2016 Imaging   1. Stable exam.  No new or  progressive findings. 2. No CT findings to explain odynophagia   09/02/2016 Pathology Results   The findings are consistent with involvement by previously known chronic lymphocytic leukemia   09/02/2016  Pathology Results   FISH for CLL came back positive for deletion 13q   09/15/2016 Imaging   1. Borderline enlarged abdominal and pelvic lymph nodes. Compared with 11/03/2011 these are decreased in size as detailed above. 2. Persistent splenomegaly. 3. Aortic Atherosclerosis (ICD10-I70.0). LAD coronary artery calcification noted.   10/06/2016 - 02/24/2017 Chemotherapy   He received Bendamustine and Rituxan   11/03/2016 Adverse Reaction   Dose of Bendamustine is reduced due to severe pancytopenia   12/28/2016 Imaging   1. Borderline enlarged abdominal peritoneal ligament and abdominal retroperitoneal lymph nodes, stable. 2. Splenomegaly. 3.  Aortic atherosclerosis (ICD10-170.0).   03/22/2017 PET scan   1. No hypermetabolic adenopathy identified within the neck, chest, abdomen or pelvis. 2. Prominent left retroperitoneal node measures 1.6 cm without significant FDG uptake. 3. Splenomegaly. 4. Aortic Atherosclerosis (ICD10-I70.0) and Emphysema (ICD10-J43.9). LAD and left circumflex atherosclerotic calcifications noted.   02/02/2018 Procedure   IMPRESSION: Widely patent right IJ power port catheter.   04/06/2018 Imaging   1. Interval enlargement of axillary, mediastinal, and retroperitoneal lymph nodes, as well as increased splenomegaly, findings concerning for progression of CLL in comparison to prior PET-CT dated 03/22/2017.  2.  Other chronic and incidental findings as detailed above.    04/16/2018 -  Chemotherapy   The patient had acalabrutinib for chemo   04/17/2018 - 04/21/2018 Hospital Admission   He was hospitalized for influenza     REVIEW OF SYSTEMS:   Constitutional: Denies fevers, chills or abnormal weight loss Eyes: Denies blurriness of vision Ears, nose, mouth, throat, and face: Denies mucositis or sore throat Respiratory: Denies cough, dyspnea or wheezes Cardiovascular: Denies palpitation, chest discomfort or lower extremity swelling Gastrointestinal:  Denies nausea,  heartburn or change in bowel habits Skin: Denies abnormal skin rashes Lymphatics: Denies new lymphadenopathy Neurological:Denies numbness, tingling or new weaknesses Behavioral/Psych: Mood is stable, no new changes  All other systems were reviewed with the patient and are negative.  I have reviewed the past medical history, past surgical history, social history and family history with the patient and they are unchanged from previous note.  ALLERGIES:  is allergic to codeine.  MEDICATIONS:  Current Outpatient Medications  Medication Sig Dispense Refill  . Acalabrutinib 100 MG CAPS Take 100 mg by mouth 2 (two) times daily.    Marland Kitchen acyclovir (ZOVIRAX) 400 MG tablet TAKE 1 TABLET BY MOUTH TWICE A DAY 180 tablet 1  . allopurinol (ZYLOPRIM) 300 MG tablet Take 1 tablet (300 mg total) by mouth daily. 30 tablet 5  . ALPRAZolam (XANAX) 0.25 MG tablet Take 0.25 mg by mouth as needed for anxiety.    Marland Kitchen b complex vitamins tablet Take 1 tablet by mouth daily.     . benzonatate (TESSALON) 100 MG capsule Take 2 capsules (200 mg total) by mouth 2 (two) times daily as needed for cough. 20 capsule 0  . Cholecalciferol (VITAMIN D-1000 MAX ST) 1000 units tablet Take 1,000 Units by mouth daily.     . famotidine (PEPCID) 20 MG tablet Take 20 mg by mouth 2 (two) times daily.     . fluticasone (FLONASE) 50 MCG/ACT nasal spray Place 2 sprays into the nose daily.     . furosemide (LASIX) 20 MG tablet TAKE 1 TABLET BY MOUTH EVERY DAY AS NEEDED 30 tablet 3  . HYDROcodone-acetaminophen ([place])  10-325 MG tablet Take 1 tablet by mouth See admin instructions. Every 6 to 8 hours as needed for pain  0  . lidocaine-prilocaine (EMLA) cream APPLY TO AFFECTED AREA ONCE (Patient taking differently: Apply 1 application topically once. ) 30 g 3  . loperamide (IMODIUM) 2 MG capsule TAKE 2 CAPSULES BY MOUTH AS NEEDED DIARRHEA OR LOOSE STOOLS    . meclizine (ANTIVERT) 12.5 MG tablet Take 12.5 mg by mouth every 6 (six) hours as needed for  dizziness.   0  . metFORMIN (GLUCOPHAGE-XR) 500 MG 24 hr tablet Take 500 mg by mouth See admin instructions. Takes one tablet in AM and 2 tablets at night.    . metoprolol succinate (TOPROL-XL) 25 MG 24 hr tablet Take 0.5 tablets (12.5 mg total) by mouth daily. 90 tablet 3  . nitroGLYCERIN (NITROSTAT) 0.4 MG SL tablet PLACE 1 TABLET UNDER THE TONGUE EVERY 5 MINUTES X 3 DOSES AS NEEDED FOR CHEST PAIN *MAX 3 DOSES* (Patient taking differently: Place 0.4 mg under the tongue every 5 (five) minutes as needed for chest pain. ) 25 tablet 3  . nortriptyline (PAMELOR) 25 MG capsule TAKE 4 CAPSULES BY MOUTH AT BEDTIME. 360 capsule 0  . pregabalin (LYRICA) 200 MG capsule One tab three times a day and 2 tabs at night 150 capsule 5  . PROVENTIL HFA 108 (90 Base) MCG/ACT inhaler TAKE 2 PUFFS BY MOUTH EVERY 6 HOURS AS NEEDED FOR WHEEZE OR SHORTNESS OF BREATH (Patient taking differently: Inhale 2 puffs into the lungs every 6 (six) hours as needed for wheezing or shortness of breath. ) 20.1 Inhaler 2  . rosuvastatin (CRESTOR) 5 MG tablet Take 1 tablet (5 mg total) by mouth daily. 90 tablet 3  . sildenafil (VIAGRA) 100 MG tablet Take 0.5-1 tablets (50-100 mg total) by mouth as needed for erectile dysfunction. 10 tablet 3  . zolpidem (AMBIEN) 10 MG tablet Take 10 mg by mouth at bedtime as needed for sleep.     No current facility-administered medications for this visit.    Facility-Administered Medications Ordered in Other Visits  Medication Dose Route Frequency Provider Last Rate Last Dose  . 0.9 %  sodium chloride infusion   Intravenous Continuous Alvy Bimler, Dejai Schubach, MD 50 mL/hr at 03/07/14 1005    . sodium chloride 0.9 % injection 10 mL  10 mL Intracatheter PRN Marcy Panning, MD   10 mL at 08/22/12 1721    PHYSICAL EXAMINATION: ECOG PERFORMANCE STATUS: 1 - Symptomatic but completely ambulatory GENERAL:alert, no distress and comfortable SKIN: Noted skin bruises EYES: normal, Conjunctiva are pink and non-injected,  sclera clear OROPHARYNX:no exudate, no erythema and lips, buccal mucosa, and tongue normal  NECK: supple, thyroid normal size, non-tender, without nodularity LYMPH:  no palpable lymphadenopathy in the cervical, axillary or inguinal LUNGS: clear to auscultation and percussion with normal breathing effort HEART: regular rate & rhythm and no murmurs and no lower extremity edema ABDOMEN:abdomen soft, non-tender and normal bowel sounds Musculoskeletal:no cyanosis of digits and no clubbing  NEURO: alert & oriented x 3 with fluent speech, no focal motor/sensory deficits  LABORATORY DATA:  I have reviewed the data as listed    Component Value Date/Time   NA 139 06/22/2018 0844   NA 136 01/26/2017 0940   K 4.3 06/22/2018 0844   K 4.0 01/26/2017 0940   CL 107 06/22/2018 0844   CL 104 08/03/2012 1229   CO2 25 06/22/2018 0844   CO2 23 01/26/2017 0940   GLUCOSE 155 (H) 06/22/2018 0844  GLUCOSE 146 (H) 01/26/2017 0940   GLUCOSE 223 (H) 08/03/2012 1229   BUN 11 06/22/2018 0844   BUN 9.6 01/26/2017 0940   CREATININE 0.94 06/22/2018 0844   CREATININE 1.04 04/24/2018 0821   CREATININE 1.0 01/26/2017 0940   CALCIUM 9.0 06/22/2018 0844   CALCIUM 8.7 01/26/2017 0940   PROT 6.3 (L) 06/22/2018 0844   PROT 5.9 (L) 04/13/2017 0823   PROT 5.7 (L) 01/26/2017 0940   ALBUMIN 4.0 06/22/2018 0844   ALBUMIN 3.6 01/26/2017 0940   AST 13 (L) 06/22/2018 0844   AST 13 (L) 04/24/2018 0821   AST 13 01/26/2017 0940   ALT 16 06/22/2018 0844   ALT 10 04/24/2018 0821   ALT 12 01/26/2017 0940   ALKPHOS 101 06/22/2018 0844   ALKPHOS 78 01/26/2017 0940   BILITOT 0.5 06/22/2018 0844   BILITOT 0.7 04/24/2018 0821   BILITOT 0.64 01/26/2017 0940   GFRNONAA >60 06/22/2018 0844   GFRNONAA >60 04/24/2018 0821   GFRAA >60 06/22/2018 0844   GFRAA >60 04/24/2018 0821    No results found for: SPEP, UPEP  Lab Results  Component Value Date   WBC 30.4 (H) 08/03/2018   NEUTROABS PENDING 08/03/2018   HGB 12.8 (L)  08/03/2018   HCT 38.9 (L) 08/03/2018   MCV 108.1 (H) 08/03/2018   PLT 64 (L) 08/03/2018      Chemistry      Component Value Date/Time   NA 139 06/22/2018 0844   NA 136 01/26/2017 0940   K 4.3 06/22/2018 0844   K 4.0 01/26/2017 0940   CL 107 06/22/2018 0844   CL 104 08/03/2012 1229   CO2 25 06/22/2018 0844   CO2 23 01/26/2017 0940   BUN 11 06/22/2018 0844   BUN 9.6 01/26/2017 0940   CREATININE 0.94 06/22/2018 0844   CREATININE 1.04 04/24/2018 0821   CREATININE 1.0 01/26/2017 0940      Component Value Date/Time   CALCIUM 9.0 06/22/2018 0844   CALCIUM 8.7 01/26/2017 0940   ALKPHOS 101 06/22/2018 0844   ALKPHOS 78 01/26/2017 0940   AST 13 (L) 06/22/2018 0844   AST 13 (L) 04/24/2018 0821   AST 13 01/26/2017 0940   ALT 16 06/22/2018 0844   ALT 10 04/24/2018 0821   ALT 12 01/26/2017 0940   BILITOT 0.5 06/22/2018 0844   BILITOT 0.7 04/24/2018 0821   BILITOT 0.64 01/26/2017 0940      All questions were answered. The patient knows to call the clinic with any problems, questions or concerns. No barriers to learning was detected.  I spent 15 minutes counseling the patient face to face. The total time spent in the appointment was 20 minutes and more than 50% was on counseling and review of test results  [name], MD 08/03/2018 10:32 AM

## 2018-08-03 NOTE — Assessment & Plan Note (Signed)
He has persistent pancytopenia due CLL, liver disease and chronic splenomegaly He is not symptomatic except for minor bruising.  Observe only for now.

## 2018-08-03 NOTE — Assessment & Plan Note (Signed)
He has occasional dizziness I recommend trial of switching his metoprolol to take in the evening instead

## 2018-08-07 ENCOUNTER — Telehealth: Payer: Self-pay | Admitting: Hematology and Oncology

## 2018-08-07 NOTE — Telephone Encounter (Signed)
I talk with patient regarding schedule  

## 2018-08-13 MED FILL — CALQUENCE 100 MG CAPSULE: 100 | 30 days supply | Qty: 60 | Fill #4

## 2018-08-24 ENCOUNTER — Other Ambulatory Visit: Payer: Self-pay | Admitting: Hematology and Oncology

## 2018-08-24 DIAGNOSIS — C9110 Chronic lymphocytic leukemia of B-cell type not having achieved remission: Secondary | ICD-10-CM

## 2018-09-11 MED FILL — CALQUENCE 100 MG CAPSULE: 100 | 30 days supply | Qty: 60 | Fill #5

## 2018-09-15 ENCOUNTER — Other Ambulatory Visit: Payer: Self-pay | Admitting: Hematology and Oncology

## 2018-09-15 DIAGNOSIS — C9110 Chronic lymphocytic leukemia of B-cell type not having achieved remission: Secondary | ICD-10-CM

## 2018-09-17 ENCOUNTER — Other Ambulatory Visit: Payer: Self-pay | Admitting: Neurology

## 2018-09-18 NOTE — Telephone Encounter (Signed)
Oral Oncology Patient Advocate Encounter  [employer] notified me that the patient had a question about his grant.  I called the patient and he is requesting a link to be able to check his grant. I do not have a direct link to send him but I have the approval letter with the website on it that may be helpful.  He requested that I email that.  I sent that email today, 8/4 to [email or web address]  [place] Patient [place] Phone [phone] Fax [phone] 09/18/2018   2:10 PM

## 2018-09-20 NOTE — Telephone Encounter (Signed)
Oral Oncology Patient Advocate Encounter  I received an email back from the patient stating that he was able to make an account to view his grant with the information I sent to him.  He expressed great appreciation.  [place] Patient [place] Phone [phone] Fax [phone] 09/20/2018   6:11 PM

## 2018-09-28 ENCOUNTER — Inpatient Hospital Stay: Payer: Medicare Other | Attending: Hematology and Oncology

## 2018-09-28 ENCOUNTER — Other Ambulatory Visit: Payer: Self-pay

## 2018-09-28 ENCOUNTER — Other Ambulatory Visit: Payer: Self-pay | Admitting: Nurse Practitioner

## 2018-09-28 ENCOUNTER — Inpatient Hospital Stay: Payer: Medicare Other

## 2018-09-28 ENCOUNTER — Encounter: Payer: Self-pay | Admitting: Hematology and Oncology

## 2018-09-28 ENCOUNTER — Inpatient Hospital Stay (HOSPITAL_BASED_OUTPATIENT_CLINIC_OR_DEPARTMENT_OTHER): Payer: Medicare Other | Admitting: Hematology and Oncology

## 2018-09-28 DIAGNOSIS — I2510 Atherosclerotic heart disease of native coronary artery without angina pectoris: Secondary | ICD-10-CM | POA: Insufficient documentation

## 2018-09-28 DIAGNOSIS — I259 Chronic ischemic heart disease, unspecified: Secondary | ICD-10-CM | POA: Diagnosis not present

## 2018-09-28 DIAGNOSIS — C8308 Small cell B-cell lymphoma, lymph nodes of multiple sites: Secondary | ICD-10-CM | POA: Diagnosis not present

## 2018-09-28 DIAGNOSIS — Z95828 Presence of other vascular implants and grafts: Secondary | ICD-10-CM

## 2018-09-28 DIAGNOSIS — D696 Thrombocytopenia, unspecified: Secondary | ICD-10-CM

## 2018-09-28 DIAGNOSIS — C9110 Chronic lymphocytic leukemia of B-cell type not having achieved remission: Secondary | ICD-10-CM | POA: Insufficient documentation

## 2018-09-28 DIAGNOSIS — D801 Nonfamilial hypogammaglobulinemia: Secondary | ICD-10-CM

## 2018-09-28 DIAGNOSIS — D61818 Other pancytopenia: Secondary | ICD-10-CM | POA: Diagnosis not present

## 2018-09-28 DIAGNOSIS — Z79899 Other long term (current) drug therapy: Secondary | ICD-10-CM | POA: Insufficient documentation

## 2018-09-28 DIAGNOSIS — R161 Splenomegaly, not elsewhere classified: Secondary | ICD-10-CM | POA: Diagnosis not present

## 2018-09-28 LAB — CBC WITH DIFFERENTIAL/PLATELET
Abs Immature Granulocytes: 0.11 10*3/uL — ABNORMAL HIGH (ref 0.00–0.07)
Basophils Absolute: 0 10*3/uL (ref 0.0–0.1)
Basophils Relative: 0 %
Eosinophils Absolute: 0 10*3/uL (ref 0.0–0.5)
Eosinophils Relative: 0 %
HCT: 43.2 % (ref 39.0–52.0)
Hemoglobin: 14.5 g/dL (ref 13.0–17.0)
Immature Granulocytes: 1 %
Lymphocytes Relative: 74 %
Lymphs Abs: 14 10*3/uL — ABNORMAL HIGH (ref 0.7–4.0)
MCH: 35.5 pg — ABNORMAL HIGH (ref 26.0–34.0)
MCHC: 33.6 g/dL (ref 30.0–36.0)
MCV: 105.9 fL — ABNORMAL HIGH (ref 80.0–100.0)
Monocytes Absolute: 0.5 10*3/uL (ref 0.1–1.0)
Monocytes Relative: 2 %
Neutro Abs: 4.5 10*3/uL (ref 1.7–7.7)
Neutrophils Relative %: 23 %
Platelets: 56 10*3/uL — ABNORMAL LOW (ref 150–400)
RBC: 4.08 MIL/uL — ABNORMAL LOW (ref 4.22–5.81)
RDW: 16.5 % — ABNORMAL HIGH (ref 11.5–15.5)
WBC: 19.1 10*3/uL — ABNORMAL HIGH (ref 4.0–10.5)
nRBC: 0 % (ref 0.0–0.2)

## 2018-09-28 LAB — COMPREHENSIVE METABOLIC PANEL
ALT: 14 U/L (ref 0–44)
AST: 14 U/L — ABNORMAL LOW (ref 15–41)
Albumin: 4 g/dL (ref 3.5–5.0)
Alkaline Phosphatase: 101 U/L (ref 38–126)
Anion gap: 8 (ref 5–15)
BUN: 8 mg/dL (ref 8–23)
CO2: 26 mmol/L (ref 22–32)
Calcium: 8.9 mg/dL (ref 8.9–10.3)
Chloride: 106 mmol/L (ref 98–111)
Creatinine, Ser: 0.89 mg/dL (ref 0.61–1.24)
GFR calc Af Amer: >60 mL/min (ref >60)
GFR calc non Af Amer: >60 mL/min (ref >60)
Glucose, Bld: 95 mg/dL (ref 70–99)
Potassium: 4.2 mmol/L (ref 3.5–5.1)
Sodium: 140 mmol/L (ref 135–145)
Total Bilirubin: 0.7 mg/dL (ref 0.3–1.2)
Total Protein: 5.9 g/dL — ABNORMAL LOW (ref 6.5–8.1)

## 2018-09-28 LAB — URIC ACID: Uric Acid, Serum: 4.1 mg/dL (ref 3.7–8.6)

## 2018-09-28 LAB — LACTATE DEHYDROGENASE: LDH: 177 U/L (ref 98–192)

## 2018-09-28 MED ORDER — HEPARIN SOD (PORK) LOCK FLUSH 100 UNIT/ML IV SOLN
250.0000 [IU] | Freq: Once | INTRAVENOUS | Status: AC
  Administered 2018-09-28: 12:00:00 250 [IU]
  Filled 2018-09-28: qty 5

## 2018-09-28 MED ORDER — SODIUM CHLORIDE 0.9% FLUSH
10.0000 mL | Freq: Once | INTRAVENOUS | Status: AC
  Administered 2018-09-28: 10 mL
  Filled 2018-09-28: qty 10

## 2018-09-28 NOTE — Assessment & Plan Note (Signed)
He tolerated treatment well The lymphocytosis continues to improve There are no palpable lymphadenopathy on exam He has some bruising which is not unexpected We will proceed with treatment without dose adjustment I will see him again in 2 months for further follow-up I plan to repeat imaging study after resolution of lymphocytosis

## 2018-09-28 NOTE — Assessment & Plan Note (Signed)
He has persistent pancytopenia due CLL, liver disease and chronic splenomegaly He is not symptomatic except for minor bruising.  Observe only for now.

## 2018-09-28 NOTE — Progress Notes (Signed)
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  Observe only for now.   No orders of the defined types were placed in this encounter.   INTERVAL HISTORY: Please see below for problem oriented charting. 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  No evidence of aortic dissection or other acute process in the chest. 2.  Centrilobular emphysema with a 5 mm right lung nodule. Given the concurrent centrilobular emphysema, follow-up chest CT at 6 -12 months is recommended.  3.  Coronary artery atherosclerosis which is age advanced. 4.  Prominent thoracic lymph nodes.  These can be reevaluated at follow-up.   05/06/2010 Imaging   1.  Multiple small periaortic lymph nodes consistent with the patient's history of the chronic lymphocytic leukemia. 2.  No evidence of  solid organ involvement   11/03/2011 Imaging   1.  Interval progression of abdominal and pelvic adenopathy. 2.  Progression of splenomegaly.  The spleen now measures 23 cm in length   11/18/2011 Bone Marrow Biopsy   Bone Marrow, Aspirate,Biopsy, and Clot, right iliac bone - HYPERCELLULAR BONE MARROW WITH EXTENSIVE INVOLVEMENT BY CHRONIC LYMPHOCYTIC LEUKEMIA. PERIPHERAL BLOOD: - CHRONIC LYMPHOCYTIC LEUKEMIA   03/08/2012 Imaging   1.  Progressive increase in retroperitoneal, iliac, and inguinal lymphadenopathy. 2.  Interval increase in massive splenomegaly.    06/01/2012 Procedure   Placement of single lumen port a cath via right internal jugular vein.  The catheter tip lies at the cavoatrial junction.  A power injectable port a cath was placed and is ready for immediate use   06/20/2012 - 11/23/2012 Chemotherapy   He received FCR x 6 cycles   12/19/2012 Imaging   Left common iliac stent. Abdominal vasculature remains patent. Improving supraclavicular and axillary lymphadenopathy. Residual right subpectoral nodes measure up to 10 mm short axis. Improving retroperitoneal lymphadenopathy, measuring up to 16 mm short axis. Improving splenomegaly, measuring 18.7 cm.    01/20/2016 Imaging   1. Stable exam.  No new or progressive findings. 2. No CT findings to explain odynophagia   09/02/2016 Pathology Results   The findings are consistent with involvement by previously known chronic lymphocytic leukemia   09/02/2016 Pathology Results   FISH for CLL came back positive for deletion 13q   09/15/2016 Imaging   1. Borderline enlarged abdominal and pelvic lymph nodes. Compared with 11/03/2011 these are decreased in size as detailed above. 2. Persistent splenomegaly. 3. Aortic Atherosclerosis (ICD10-I70.0). LAD coronary artery calcification noted.  10/06/2016 - 02/24/2017 Chemotherapy   He received Bendamustine and Rituxan   11/03/2016 Adverse Reaction   Dose of Bendamustine is reduced due to  severe pancytopenia   12/28/2016 Imaging   1. Borderline enlarged abdominal peritoneal ligament and abdominal retroperitoneal lymph nodes, stable. 2. Splenomegaly. 3.  Aortic atherosclerosis (ICD10-170.0).   03/22/2017 PET scan   1. No hypermetabolic adenopathy identified within the neck, chest, abdomen or pelvis. 2. Prominent left retroperitoneal node measures 1.6 cm without significant FDG uptake. 3. Splenomegaly. 4. Aortic Atherosclerosis (ICD10-I70.0) and Emphysema (ICD10-J43.9). LAD and left circumflex atherosclerotic calcifications noted.   02/02/2018 Procedure   IMPRESSION: Widely patent right IJ power port catheter.   04/06/2018 Imaging   1. Interval enlargement of axillary, mediastinal, and retroperitoneal lymph nodes, as well as increased splenomegaly, findings concerning for progression of CLL in comparison to prior PET-CT dated 03/22/2017.  2.  Other chronic and incidental findings as detailed above.    04/16/2018 -  Chemotherapy   The patient had acalabrutinib for chemo   04/17/2018 - 04/21/2018 Hospital Admission   He was hospitalized for influenza     REVIEW OF SYSTEMS:   Constitutional: Denies fevers, chills or abnormal weight loss Eyes: Denies blurriness of vision Ears, nose, mouth, throat, and face: Denies mucositis or sore throat Respiratory: Denies cough, dyspnea or wheezes Cardiovascular: Denies palpitation, chest discomfort or lower extremity swelling Gastrointestinal:  Denies nausea, heartburn or change in bowel habits Skin: Denies abnormal skin rashes Lymphatics: Denies new lymphadenopathy or easy bruising Neurological:Denies numbness, tingling or new weaknesses Behavioral/Psych: Mood is stable, no new changes  All other systems were reviewed with the patient and are negative.  I have reviewed the past medical history, past surgical history, social history and family history with the patient and they are unchanged from previous note.  ALLERGIES:  is  allergic to codeine.  MEDICATIONS:  Current Outpatient Medications  Medication Sig Dispense Refill  . Acalabrutinib 100 MG CAPS Take 100 mg by mouth 2 (two) times daily.    Marland Kitchen acyclovir (ZOVIRAX) 400 MG tablet TAKE 1 TABLET BY MOUTH TWICE A DAY 180 tablet 1  . allopurinol (ZYLOPRIM) 300 MG tablet TAKE 1 TABLET BY MOUTH EVERY DAY 90 tablet 1  . ALPRAZolam (XANAX) 0.25 MG tablet Take 0.25 mg by mouth as needed for anxiety.    Marland Kitchen b complex vitamins tablet Take 1 tablet by mouth daily.     . benzonatate (TESSALON) 100 MG capsule Take 2 capsules (200 mg total) by mouth 2 (two) times daily as needed for cough. 20 capsule 0  . Cholecalciferol (VITAMIN D-1000 MAX ST) 1000 units tablet Take 1,000 Units by mouth daily.     . famotidine (PEPCID) 20 MG tablet Take 20 mg by mouth 2 (two) times daily.     . fluticasone (FLONASE) 50 MCG/ACT nasal spray Place 2 sprays into the nose daily.     . furosemide (LASIX) 20 MG tablet TAKE 1 TABLET BY MOUTH EVERY DAY AS NEEDED 90 tablet 2  . HYDROcodone-acetaminophen (NORCO) 10-325 MG tablet Take 1 tablet by mouth See admin instructions. Every 6 to 8 hours as needed for pain  0  . lidocaine-prilocaine (EMLA) cream APPLY TO AFFECTED AREA ONCE (Patient taking differently: Apply 1 application topically once. ) 30 g 3  . loperamide (IMODIUM) 2 MG capsule TAKE 2 CAPSULES BY MOUTH AS NEEDED DIARRHEA OR LOOSE STOOLS 60 capsule 2  . meclizine (ANTIVERT) 12.5 MG tablet Take 12.5 mg by mouth every 6 (six) hours as  needed for dizziness.   0  . metFORMIN (GLUCOPHAGE-XR) 500 MG 24 hr tablet Take 500 mg by mouth See admin instructions. Takes one tablet in AM and 2 tablets at night.    . metoprolol succinate (TOPROL-XL) 25 MG 24 hr tablet Take 0.5 tablets (12.5 mg total) by mouth daily. 90 tablet 3  . nitroGLYCERIN (NITROSTAT) 0.4 MG SL tablet PLACE 1 TABLET UNDER THE TONGUE EVERY 5 MINUTES X 3 DOSES AS NEEDED FOR CHEST PAIN *MAX 3 DOSES* (Patient taking differently: Place 0.4 mg under  the tongue every 5 (five) minutes as needed for chest pain. ) 25 tablet 3  . nortriptyline (PAMELOR) 25 MG capsule TAKE 4 CAPSULES BY MOUTH AT BEDTIME. NEED TO SEE DOCTOR FOR REFILLS (CALL [phone]) 360 capsule 0  . pregabalin (LYRICA) 200 MG capsule One tab three times a day and 2 tabs at night 150 capsule 5  . PROVENTIL HFA 108 (90 Base) MCG/ACT inhaler TAKE 2 PUFFS BY MOUTH EVERY 6 HOURS AS NEEDED FOR WHEEZE OR SHORTNESS OF BREATH (Patient taking differently: Inhale 2 puffs into the lungs every 6 (six) hours as needed for wheezing or shortness of breath. ) 20.1 Inhaler 2  . rosuvastatin (CRESTOR) 5 MG tablet Take 1 tablet (5 mg total) by mouth daily. 90 tablet 3  . sildenafil (VIAGRA) 100 MG tablet Take 0.5-1 tablets (50-100 mg total) by mouth as needed for erectile dysfunction. 10 tablet 3  . zolpidem (AMBIEN) 10 MG tablet Take 10 mg by mouth at bedtime as needed for sleep.     No current facility-administered medications for this visit.    Facility-Administered Medications Ordered in Other Visits  Medication Dose Route Frequency Provider Last Rate Last Dose  . 0.9 %  sodium chloride infusion   Intravenous Continuous Alvy Bimler, Jelan Batterton, MD 50 mL/hr at 03/07/14 1005    . sodium chloride 0.9 % injection 10 mL  10 mL Intracatheter PRN Marcy Panning, MD   10 mL at 08/22/12 1721    PHYSICAL EXAMINATION: ECOG PERFORMANCE STATUS: 1 - Symptomatic but completely ambulatory  Vitals:   09/28/18 1215  BP: 112/62  Pulse: 66  Resp: 18  Temp: 98 F (36.7 C)  SpO2: 98%   Filed Weights   09/28/18 1215  Weight: 201 lb 1.6 oz (91.2 kg)    GENERAL:alert, no distress and comfortable SKIN: Noted significant skin bruises EYES: normal, Conjunctiva are pink and non-injected, sclera clear OROPHARYNX:no exudate, no erythema and lips, buccal mucosa, and tongue normal  NECK: supple, thyroid normal size, non-tender, without nodularity LYMPH:  no palpable lymphadenopathy in the cervical, axillary or  inguinal LUNGS: clear to auscultation and percussion with normal breathing effort HEART: regular rate & rhythm and no murmurs and no lower extremity edema ABDOMEN:abdomen soft, non-tender and normal bowel sounds Musculoskeletal:no cyanosis of digits and no clubbing  NEURO: alert & oriented x 3 with fluent speech, no focal motor/sensory deficits  LABORATORY DATA:  I have reviewed the data as listed    Component Value Date/Time   NA 140 09/28/2018 1158   NA 136 01/26/2017 0940   K 4.2 09/28/2018 1158   K 4.0 01/26/2017 0940   CL 106 09/28/2018 1158   CL 104 08/03/2012 1229   CO2 26 09/28/2018 1158   CO2 23 01/26/2017 0940   GLUCOSE 95 09/28/2018 1158   GLUCOSE 146 (H) 01/26/2017 0940   GLUCOSE 223 (H) 08/03/2012 1229   BUN 8 09/28/2018 1158   BUN 9.6 01/26/2017 0940   CREATININE 0.89  09/28/2018 1158   CREATININE 1.04 04/24/2018 0821   CREATININE 1.0 01/26/2017 0940   CALCIUM 8.9 09/28/2018 1158   CALCIUM 8.7 01/26/2017 0940   PROT 5.9 (L) 09/28/2018 1158   PROT 5.9 (L) 04/13/2017 0823   PROT 5.7 (L) 01/26/2017 0940   ALBUMIN 4.0 09/28/2018 1158   ALBUMIN 3.6 01/26/2017 0940   AST 14 (L) 09/28/2018 1158   AST 13 (L) 04/24/2018 0821   AST 13 01/26/2017 0940   ALT 14 09/28/2018 1158   ALT 10 04/24/2018 0821   ALT 12 01/26/2017 0940   ALKPHOS 101 09/28/2018 1158   ALKPHOS 78 01/26/2017 0940   BILITOT 0.7 09/28/2018 1158   BILITOT 0.7 04/24/2018 0821   BILITOT 0.64 01/26/2017 0940   GFRNONAA >60 09/28/2018 1158   GFRNONAA >60 04/24/2018 0821   GFRAA >60 09/28/2018 1158   GFRAA >60 04/24/2018 0821    No results found for: SPEP, UPEP  Lab Results  Component Value Date   WBC 19.1 (H) 09/28/2018   NEUTROABS 4.5 09/28/2018   HGB 14.5 09/28/2018   HCT 43.2 09/28/2018   MCV 105.9 (H) 09/28/2018   PLT 56 (L) 09/28/2018      Chemistry      Component Value Date/Time   NA 140 09/28/2018 1158   NA 136 01/26/2017 0940   K 4.2 09/28/2018 1158   K 4.0 01/26/2017 0940    CL 106 09/28/2018 1158   CL 104 08/03/2012 1229   CO2 26 09/28/2018 1158   CO2 23 01/26/2017 0940   BUN 8 09/28/2018 1158   BUN 9.6 01/26/2017 0940   CREATININE 0.89 09/28/2018 1158   CREATININE 1.04 04/24/2018 0821   CREATININE 1.0 01/26/2017 0940      Component Value Date/Time   CALCIUM 8.9 09/28/2018 1158   CALCIUM 8.7 01/26/2017 0940   ALKPHOS 101 09/28/2018 1158   ALKPHOS 78 01/26/2017 0940   AST 14 (L) 09/28/2018 1158   AST 13 (L) 04/24/2018 0821   AST 13 01/26/2017 0940   ALT 14 09/28/2018 1158   ALT 10 04/24/2018 0821   ALT 12 01/26/2017 0940   BILITOT 0.7 09/28/2018 1158   BILITOT 0.7 04/24/2018 0821   BILITOT 0.64 01/26/2017 0940       All questions were answered. The patient knows to call the clinic with any problems, questions or concerns. No barriers to learning was detected.  I spent 15 minutes counseling the patient face to face. The total time spent in the appointment was 20 minutes and more than 50% was on counseling and review of test results  [name], MD 09/28/2018 3:16 PM

## 2018-10-01 ENCOUNTER — Telehealth: Payer: Self-pay | Admitting: Hematology and Oncology

## 2018-10-01 NOTE — Telephone Encounter (Signed)
Called patient regarding upcoming appointments scheduled per 08/14 los, left a voicemail and will mail a calender.

## 2018-10-11 MED FILL — CALQUENCE 100 MG CAPSULE: 100 | 30 days supply | Qty: 60 | Fill #6

## 2018-11-08 MED FILL — CALQUENCE 100 MG CAPSULE: 100 | 30 days supply | Qty: 60 | Fill #7

## 2018-11-22 ENCOUNTER — Other Ambulatory Visit: Payer: Self-pay | Admitting: Hematology and Oncology

## 2018-11-22 DIAGNOSIS — C8308 Small cell B-cell lymphoma, lymph nodes of multiple sites: Secondary | ICD-10-CM

## 2018-11-23 ENCOUNTER — Inpatient Hospital Stay: Payer: Medicare Other

## 2018-11-23 ENCOUNTER — Inpatient Hospital Stay (HOSPITAL_BASED_OUTPATIENT_CLINIC_OR_DEPARTMENT_OTHER): Payer: Medicare Other | Admitting: Hematology and Oncology

## 2018-11-23 ENCOUNTER — Inpatient Hospital Stay: Payer: Medicare Other | Attending: Hematology and Oncology

## 2018-11-23 ENCOUNTER — Other Ambulatory Visit: Payer: Self-pay

## 2018-11-23 ENCOUNTER — Encounter: Payer: Self-pay | Admitting: Hematology and Oncology

## 2018-11-23 ENCOUNTER — Telehealth: Payer: Self-pay | Admitting: Hematology and Oncology

## 2018-11-23 VITALS — BP 106/68 | HR 57 | Temp 98.3°F | Resp 18 | Ht 72.0 in | Wt 193.8 lb

## 2018-11-23 DIAGNOSIS — C9110 Chronic lymphocytic leukemia of B-cell type not having achieved remission: Secondary | ICD-10-CM | POA: Diagnosis not present

## 2018-11-23 DIAGNOSIS — D696 Thrombocytopenia, unspecified: Secondary | ICD-10-CM | POA: Diagnosis not present

## 2018-11-23 DIAGNOSIS — R161 Splenomegaly, not elsewhere classified: Secondary | ICD-10-CM | POA: Diagnosis not present

## 2018-11-23 DIAGNOSIS — C8308 Small cell B-cell lymphoma, lymph nodes of multiple sites: Secondary | ICD-10-CM | POA: Diagnosis not present

## 2018-11-23 DIAGNOSIS — Z79899 Other long term (current) drug therapy: Secondary | ICD-10-CM | POA: Diagnosis not present

## 2018-11-23 DIAGNOSIS — J449 Chronic obstructive pulmonary disease, unspecified: Secondary | ICD-10-CM | POA: Diagnosis not present

## 2018-11-23 DIAGNOSIS — I2510 Atherosclerotic heart disease of native coronary artery without angina pectoris: Secondary | ICD-10-CM | POA: Insufficient documentation

## 2018-11-23 DIAGNOSIS — D61818 Other pancytopenia: Secondary | ICD-10-CM | POA: Diagnosis not present

## 2018-11-23 DIAGNOSIS — Z23 Encounter for immunization: Secondary | ICD-10-CM | POA: Insufficient documentation

## 2018-11-23 DIAGNOSIS — I259 Chronic ischemic heart disease, unspecified: Secondary | ICD-10-CM

## 2018-11-23 DIAGNOSIS — Z95828 Presence of other vascular implants and grafts: Secondary | ICD-10-CM

## 2018-11-23 LAB — CBC WITH DIFFERENTIAL/PLATELET
Abs Immature Granulocytes: 0.11 10*3/uL — ABNORMAL HIGH (ref 0.00–0.07)
Basophils Absolute: 0 10*3/uL (ref 0.0–0.1)
Basophils Relative: 0 %
Eosinophils Absolute: 0.1 10*3/uL (ref 0.0–0.5)
Eosinophils Relative: 1 %
HCT: 42.9 % (ref 39.0–52.0)
Hemoglobin: 14.6 g/dL (ref 13.0–17.0)
Immature Granulocytes: 1 %
Lymphocytes Relative: 54 %
Lymphs Abs: 5.7 10*3/uL — ABNORMAL HIGH (ref 0.7–4.0)
MCH: 34.9 pg — ABNORMAL HIGH (ref 26.0–34.0)
MCHC: 34 g/dL (ref 30.0–36.0)
MCV: 102.6 fL — ABNORMAL HIGH (ref 80.0–100.0)
Monocytes Absolute: 0.4 10*3/uL (ref 0.1–1.0)
Monocytes Relative: 4 %
Neutro Abs: 4.2 10*3/uL (ref 1.7–7.7)
Neutrophils Relative %: 40 %
Platelets: 66 10*3/uL — ABNORMAL LOW (ref 150–400)
RBC: 4.18 MIL/uL — ABNORMAL LOW (ref 4.22–5.81)
RDW: 16.1 % — ABNORMAL HIGH (ref 11.5–15.5)
WBC: 10.5 10*3/uL (ref 4.0–10.5)
nRBC: 0 % (ref 0.0–0.2)

## 2018-11-23 LAB — COMPREHENSIVE METABOLIC PANEL
ALT: 10 U/L (ref 0–44)
AST: 12 U/L — ABNORMAL LOW (ref 15–41)
Albumin: 3.9 g/dL (ref 3.5–5.0)
Alkaline Phosphatase: 112 U/L (ref 38–126)
Anion gap: 9 (ref 5–15)
BUN: 12 mg/dL (ref 8–23)
CO2: 25 mmol/L (ref 22–32)
Calcium: 8.8 mg/dL — ABNORMAL LOW (ref 8.9–10.3)
Chloride: 107 mmol/L (ref 98–111)
Creatinine, Ser: 0.79 mg/dL (ref 0.61–1.24)
GFR calc Af Amer: >60 mL/min (ref >60)
GFR calc non Af Amer: >60 mL/min (ref >60)
Glucose, Bld: 129 mg/dL — ABNORMAL HIGH (ref 70–99)
Potassium: 4.3 mmol/L (ref 3.5–5.1)
Sodium: 141 mmol/L (ref 135–145)
Total Bilirubin: 0.8 mg/dL (ref 0.3–1.2)
Total Protein: 6 g/dL — ABNORMAL LOW (ref 6.5–8.1)

## 2018-11-23 LAB — LACTATE DEHYDROGENASE: LDH: 197 U/L — ABNORMAL HIGH (ref 98–192)

## 2018-11-23 LAB — URIC ACID: Uric Acid, Serum: 4.2 mg/dL (ref 3.7–8.6)

## 2018-11-23 MED ORDER — PEGFILGRASTIM-JMDB 6 MG/0.6ML ~~LOC~~ SOSY
PREFILLED_SYRINGE | SUBCUTANEOUS | Status: AC
  Filled 2018-11-23: qty 0.6

## 2018-11-23 MED ORDER — HEPARIN SOD (PORK) LOCK FLUSH 100 UNIT/ML IV SOLN
500.0000 [IU] | Freq: Once | INTRAVENOUS | Status: AC
  Administered 2018-11-23: 13:00:00 500 [IU]
  Filled 2018-11-23: qty 5

## 2018-11-23 MED ORDER — INFLUENZA VAC A&B SA ADJ QUAD 0.5 ML IM PRSY
PREFILLED_SYRINGE | INTRAMUSCULAR | Status: AC
  Filled 2018-11-23: qty 0.5

## 2018-11-23 MED ORDER — SODIUM CHLORIDE 0.9% FLUSH
10.0000 mL | Freq: Once | INTRAVENOUS | Status: AC
  Administered 2018-11-23: 13:00:00 10 mL
  Filled 2018-11-23: qty 10

## 2018-11-23 MED ORDER — INFLUENZA VAC A&B SA ADJ QUAD 0.5 ML IM PRSY
0.5000 mL | PREFILLED_SYRINGE | Freq: Once | INTRAMUSCULAR | Status: AC
  Administered 2018-11-23: 13:00:00 0.5 mL via INTRAMUSCULAR

## 2018-11-23 MED ORDER — PREDNISONE 20 MG PO TABS: 40.0000 mg | ORAL_TABLET | Freq: Every day | ORAL | 0 refills | Status: DC

## 2018-11-23 NOTE — Telephone Encounter (Signed)
I talk with patient regarding 10/16

## 2018-11-23 NOTE — Progress Notes (Signed)
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 He does not have any prior allergic reactions to influenza vaccination. He agrees to proceed with influenza vaccination today and we will administer it today at the clinic.   Thrombocytopenia ([place]) He has persistent pancytopenia due CLL, liver disease and chronic splenomegaly He is not symptomatic except for minor bruising.  Observe only for now.   Orders Placed This Encounter  Procedures  . CT CHEST W CONTRAST    Standing Status:   Future    Standing Expiration Date:   11/23/2019    Order Specific Question:   If indicated for the ordered procedure, I authorize the administration of contrast media per Radiology protocol    Answer:   Yes    Order Specific Question:   Preferred imaging location?    Answer:   [facility]    Order Specific Question:   Radiology Contrast Protocol - do NOT remove file path    Answer:   \\[email or web address]  . CT ABDOMEN PELVIS W CONTRAST    Standing Status:   Future    Standing Expiration Date:   11/23/2019    Order Specific Question:   If indicated for the ordered procedure, I authorize the  administration of contrast media per Radiology protocol    Answer:   Yes    Order Specific Question:   Preferred imaging location?    Answer:   [facility]    Order Specific Question:   Radiology Contrast Protocol - do NOT remove file path    Answer:   \\[email or web address]    INTERVAL HISTORY: Please see below for problem oriented charting. He returns for CLL follow-up He has been complaining of some mucus congestion and chronic cough for 6 months He denies recent smoking No recent fever or chills The patient denies any recent signs or symptoms of bleeding such as spontaneous epistaxis, hematuria or hematochezia. 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  No evidence of aortic dissection or other acute process in the chest. 2.  Centrilobular emphysema with a 5 mm right lung nodule. Given the concurrent centrilobular emphysema, follow-up chest CT at 6 -12 months is recommended.  3.  Coronary artery atherosclerosis which is age advanced. 4.  Prominent thoracic lymph nodes.  These can be reevaluated at follow-up.   05/06/2010 Imaging   1.  Multiple small periaortic lymph nodes consistent with the patient's history of the chronic lymphocytic leukemia. 2.  No evidence of solid organ involvement   11/03/2011  Imaging   1.  Interval progression of abdominal and pelvic adenopathy. 2.  Progression of splenomegaly.  The spleen now measures 23 cm in length   11/18/2011 Bone Marrow Biopsy   Bone Marrow, Aspirate,Biopsy, and Clot, right iliac bone - HYPERCELLULAR BONE MARROW WITH EXTENSIVE INVOLVEMENT BY CHRONIC LYMPHOCYTIC LEUKEMIA. PERIPHERAL BLOOD: - CHRONIC LYMPHOCYTIC LEUKEMIA   03/08/2012 Imaging    1.  Progressive increase in retroperitoneal, iliac, and inguinal lymphadenopathy. 2.  Interval increase in massive splenomegaly.    06/01/2012 Procedure   Placement of single lumen port a cath via right internal jugular vein.  The catheter tip lies at the cavoatrial junction.  A power injectable port a cath was placed and is ready for immediate use   06/20/2012 - 11/23/2012 Chemotherapy   He received FCR x 6 cycles   12/19/2012 Imaging   Left common iliac stent. Abdominal vasculature remains patent. Improving supraclavicular and axillary lymphadenopathy. Residual right subpectoral nodes measure up to 10 mm short axis. Improving retroperitoneal lymphadenopathy, measuring up to 16 mm short axis. Improving splenomegaly, measuring 18.7 cm.    01/20/2016 Imaging   1. Stable exam.  No new or progressive findings. 2. No CT findings to explain odynophagia   09/02/2016 Pathology Results   The findings are consistent with involvement by previously known chronic lymphocytic leukemia   09/02/2016 Pathology Results   FISH for CLL came back positive for deletion 13q   09/15/2016 Imaging   1. Borderline enlarged abdominal and pelvic lymph nodes. Compared with 11/03/2011 these are decreased in size as detailed above. 2. Persistent splenomegaly. 3. Aortic Atherosclerosis (ICD10-I70.0). LAD coronary artery calcification noted.   10/06/2016 - 02/24/2017 Chemotherapy   He received Bendamustine and Rituxan   11/03/2016 Adverse Reaction   Dose of Bendamustine is reduced due to severe pancytopenia   12/28/2016 Imaging   1. Borderline enlarged abdominal peritoneal ligament and abdominal retroperitoneal lymph nodes, stable. 2. Splenomegaly. 3.  Aortic atherosclerosis (ICD10-170.0).   03/22/2017 PET scan   1. No hypermetabolic adenopathy identified within the neck, chest, abdomen or pelvis. 2. Prominent left retroperitoneal node measures 1.6 cm without significant FDG uptake. 3. Splenomegaly. 4. Aortic  Atherosclerosis (ICD10-I70.0) and Emphysema (ICD10-J43.9). LAD and left circumflex atherosclerotic calcifications noted.   02/02/2018 Procedure   IMPRESSION: Widely patent right IJ power port catheter.   04/06/2018 Imaging   1. Interval enlargement of axillary, mediastinal, and retroperitoneal lymph nodes, as well as increased splenomegaly, findings concerning for progression of CLL in comparison to prior PET-CT dated 03/22/2017.  2.  Other chronic and incidental findings as detailed above.    04/16/2018 -  Chemotherapy   The patient had acalabrutinib for chemo   04/17/2018 - 04/21/2018 Hospital Admission   He was hospitalized for influenza     REVIEW OF SYSTEMS:   Constitutional: Denies fevers, chills or abnormal weight loss Eyes: Denies blurriness of vision Ears, nose, mouth, throat, and face: Denies mucositis or sore throat Cardiovascular: Denies palpitation, chest discomfort or lower extremity swelling Gastrointestinal:  Denies nausea, heartburn or change in bowel habits Skin: Denies abnormal skin rashes Lymphatics: Denies new lymphadenopathy or easy bruising Neurological:Denies numbness, tingling or new weaknesses Behavioral/Psych: Mood is stable, no new changes  All other systems were reviewed with the patient and are negative.  I have reviewed the past medical history, past surgical history, social history and family history with the patient and they are unchanged from previous note.  ALLERGIES:  is allergic to codeine.  MEDICATIONS:  Current Outpatient Medications  Medication  Sig Dispense Refill  . Acalabrutinib 100 MG CAPS Take 100 mg by mouth 2 (two) times daily.    Marland Kitchen acyclovir (ZOVIRAX) 400 MG tablet TAKE 1 TABLET BY MOUTH TWICE A DAY 180 tablet 1  . allopurinol (ZYLOPRIM) 300 MG tablet TAKE 1 TABLET BY MOUTH EVERY DAY 90 tablet 1  . ALPRAZolam (XANAX) 0.25 MG tablet Take 0.25 mg by mouth as needed for anxiety.    Marland Kitchen b complex vitamins tablet Take 1 tablet by mouth  daily.     . benzonatate (TESSALON) 100 MG capsule Take 2 capsules (200 mg total) by mouth 2 (two) times daily as needed for cough. 20 capsule 0  . Cholecalciferol (VITAMIN D-1000 MAX ST) 1000 units tablet Take 1,000 Units by mouth daily.     . famotidine (PEPCID) 20 MG tablet Take 20 mg by mouth 2 (two) times daily.     . fluticasone (FLONASE) 50 MCG/ACT nasal spray Place 2 sprays into the nose daily.     . furosemide (LASIX) 20 MG tablet TAKE 1 TABLET BY MOUTH EVERY DAY AS NEEDED 90 tablet 2  . HYDROcodone-acetaminophen (NORCO) 10-325 MG tablet Take 1 tablet by mouth See admin instructions. Every 6 to 8 hours as needed for pain  0  . lidocaine-prilocaine (EMLA) cream APPLY TO AFFECTED AREA ONCE (Patient taking differently: Apply 1 application topically once. ) 30 g 3  . loperamide (IMODIUM) 2 MG capsule TAKE 2 CAPSULES BY MOUTH AS NEEDED DIARRHEA OR LOOSE STOOLS 60 capsule 2  . meclizine (ANTIVERT) 12.5 MG tablet Take 12.5 mg by mouth every 6 (six) hours as needed for dizziness.   0  . metFORMIN (GLUCOPHAGE-XR) 500 MG 24 hr tablet Take 500 mg by mouth See admin instructions. Takes one tablet in AM and 2 tablets at night.    . metoprolol succinate (TOPROL-XL) 25 MG 24 hr tablet Take 0.5 tablets (12.5 mg total) by mouth daily. 90 tablet 3  . nitroGLYCERIN (NITROSTAT) 0.4 MG SL tablet PLACE 1 TABLET UNDER THE TONGUE EVERY 5 MINUTES X 3 DOSES AS NEEDED FOR CHEST PAIN *MAX 3 DOSES* (Patient taking differently: Place 0.4 mg under the tongue every 5 (five) minutes as needed for chest pain. ) 25 tablet 3  . nortriptyline (PAMELOR) 25 MG capsule TAKE 4 CAPSULES BY MOUTH AT BEDTIME. NEED TO SEE DOCTOR FOR REFILLS (CALL [phone]) 360 capsule 0  . predniSONE (DELTASONE) 20 MG tablet Take 2 tablets (40 mg total) by mouth daily with breakfast. 14 tablet 0  . pregabalin (LYRICA) 200 MG capsule One tab three times a day and 2 tabs at night 150 capsule 5  . PROVENTIL HFA 108 (90 Base) MCG/ACT inhaler TAKE 2  PUFFS BY MOUTH EVERY 6 HOURS AS NEEDED FOR WHEEZE OR SHORTNESS OF BREATH (Patient taking differently: Inhale 2 puffs into the lungs every 6 (six) hours as needed for wheezing or shortness of breath. ) 20.1 Inhaler 2  . rosuvastatin (CRESTOR) 5 MG tablet Take 1 tablet (5 mg total) by mouth daily. 90 tablet 3  . sildenafil (VIAGRA) 100 MG tablet Take 0.5-1 tablets (50-100 mg total) by mouth as needed for erectile dysfunction. 10 tablet 3  . zolpidem (AMBIEN) 10 MG tablet Take 10 mg by mouth at bedtime as needed for sleep.     No current facility-administered medications for this visit.    Facility-Administered Medications Ordered in Other Visits  Medication Dose Route Frequency Provider Last Rate Last Dose  . 0.9 %  sodium chloride infusion  Intravenous Continuous [name], MD 50 mL/hr at 03/07/14 1005    . sodium chloride 0.9 % injection 10 mL  10 mL Intracatheter PRN Marcy Panning, MD   10 mL at 08/22/12 1721    PHYSICAL EXAMINATION: ECOG PERFORMANCE STATUS: 1 - Symptomatic but completely ambulatory  Vitals:   11/23/18 1303  BP: 106/68  Pulse: (!) 57  Resp: 18  Temp: 98.3 F (36.8 C)  SpO2: 99%   Filed Weights   11/23/18 1303  Weight: 193 lb 12.8 oz (87.9 kg)    GENERAL:alert, no distress and comfortable SKIN: skin color, texture, turgor are normal, no rashes or significant lesions EYES: normal, Conjunctiva are pink and non-injected, sclera clear OROPHARYNX:no exudate, no erythema and lips, buccal mucosa, and tongue normal  NECK: supple, thyroid normal size, non-tender, without nodularity LYMPH:  no palpable lymphadenopathy in the cervical, axillary or inguinal LUNGS: Scattered bilateral expiratory wheezes are noted HEART: regular rate & rhythm and no murmurs and no lower extremity edema ABDOMEN:abdomen soft, non-tender and normal bowel sounds Musculoskeletal:no cyanosis of digits and no clubbing  NEURO: alert & oriented x 3 with fluent speech, no focal motor/sensory  deficits  LABORATORY DATA:  I have reviewed the data as listed    Component Value Date/Time   NA 141 11/23/2018 1235   NA 136 01/26/2017 0940   K 4.3 11/23/2018 1235   K 4.0 01/26/2017 0940   CL 107 11/23/2018 1235   CL 104 08/03/2012 1229   CO2 25 11/23/2018 1235   CO2 23 01/26/2017 0940   GLUCOSE 129 (H) 11/23/2018 1235   GLUCOSE 146 (H) 01/26/2017 0940   GLUCOSE 223 (H) 08/03/2012 1229   BUN 12 11/23/2018 1235   BUN 9.6 01/26/2017 0940   CREATININE 0.79 11/23/2018 1235   CREATININE 1.04 04/24/2018 0821   CREATININE 1.0 01/26/2017 0940   CALCIUM 8.8 (L) 11/23/2018 1235   CALCIUM 8.7 01/26/2017 0940   PROT 6.0 (L) 11/23/2018 1235   PROT 5.9 (L) 04/13/2017 0823   PROT 5.7 (L) 01/26/2017 0940   ALBUMIN 3.9 11/23/2018 1235   ALBUMIN 3.6 01/26/2017 0940   AST 12 (L) 11/23/2018 1235   AST 13 (L) 04/24/2018 0821   AST 13 01/26/2017 0940   ALT 10 11/23/2018 1235   ALT 10 04/24/2018 0821   ALT 12 01/26/2017 0940   ALKPHOS 112 11/23/2018 1235   ALKPHOS 78 01/26/2017 0940   BILITOT 0.8 11/23/2018 1235   BILITOT 0.7 04/24/2018 0821   BILITOT 0.64 01/26/2017 0940   GFRNONAA >60 11/23/2018 1235   GFRNONAA >60 04/24/2018 0821   GFRAA >60 11/23/2018 1235   GFRAA >60 04/24/2018 0821    No results found for: SPEP, UPEP  Lab Results  Component Value Date   WBC 10.5 11/23/2018   NEUTROABS 4.2 11/23/2018   HGB 14.6 11/23/2018   HCT 42.9 11/23/2018   MCV 102.6 (H) 11/23/2018   PLT 66 (L) 11/23/2018      Chemistry      Component Value Date/Time   NA 141 11/23/2018 1235   NA 136 01/26/2017 0940   K 4.3 11/23/2018 1235   K 4.0 01/26/2017 0940   CL 107 11/23/2018 1235   CL 104 08/03/2012 1229   CO2 25 11/23/2018 1235   CO2 23 01/26/2017 0940   BUN 12 11/23/2018 1235   BUN 9.6 01/26/2017 0940   CREATININE 0.79 11/23/2018 1235   CREATININE 1.04 04/24/2018 0821   CREATININE 1.0 01/26/2017 0940      Component  Value Date/Time   CALCIUM 8.8 (L) 11/23/2018 1235   CALCIUM  8.7 01/26/2017 0940   ALKPHOS 112 11/23/2018 1235   ALKPHOS 78 01/26/2017 0940   AST 12 (L) 11/23/2018 1235   AST 13 (L) 04/24/2018 0821   AST 13 01/26/2017 0940   ALT 10 11/23/2018 1235   ALT 10 04/24/2018 0821   ALT 12 01/26/2017 0940   BILITOT 0.8 11/23/2018 1235   BILITOT 0.7 04/24/2018 0821   BILITOT 0.64 01/26/2017 0940      All questions were answered. The patient knows to call the clinic with any problems, questions or concerns. No barriers to learning was detected.  I spent 15 minutes counseling the patient face to face. The total time spent in the appointment was 20 minutes and more than 50% was on counseling and review of test results  [name], MD 11/23/2018 1:34 PM

## 2018-11-23 NOTE — Assessment & Plan Note (Signed)
He has some mild wheezes We will repeat imaging study as above Recommend short course of prednisone and he agreed We discussed the importance of preventive care and reviewed the vaccination programs. He does not have any prior allergic reactions to influenza vaccination. He agrees to proceed with influenza vaccination today and we will administer it today at the clinic.

## 2018-11-23 NOTE — Assessment & Plan Note (Signed)
He has persistent pancytopenia due CLL, liver disease and chronic splenomegaly He is not symptomatic except for minor bruising.  Observe only for now.

## 2018-11-23 NOTE — Assessment & Plan Note (Signed)
He tolerated treatment well He has near complete resolution of lymphocytosis He is concerned about chronic cough which I suspect is due to mixed COPD I plan to repeat imaging study next week for further assessment of response to treatment He will continue treatment as prescribed

## 2018-11-27 ENCOUNTER — Telehealth: Payer: Self-pay

## 2018-11-27 NOTE — Telephone Encounter (Signed)
-----   Message from [name], MD sent at 11/27/2018  7:56 AM EDT ----- Regarding: appointment Friday If he is not able to move CT on Thursday, I can see him at 115 pm Friday or Monday

## 2018-11-27 NOTE — Telephone Encounter (Signed)
Called and given below message. He said that was the only appt radiology had on Thursday. MD appt rescheduled to 1:15 Friday. He is aware of appt.

## 2018-11-29 ENCOUNTER — Ambulatory Visit (HOSPITAL_COMMUNITY): Payer: Medicare Other

## 2018-11-29 ENCOUNTER — Other Ambulatory Visit: Payer: Self-pay

## 2018-11-29 ENCOUNTER — Ambulatory Visit (HOSPITAL_COMMUNITY)
Admission: RE | Admit: 2018-11-29 | Discharge: 2018-11-29 | Disposition: A | Discharge status: Home / Self Care | Payer: Medicare Other | Source: Ambulatory Visit | Attending: Hematology and Oncology | Admitting: Hematology and Oncology

## 2018-11-29 DIAGNOSIS — C8308 Small cell B-cell lymphoma, lymph nodes of multiple sites: Secondary | ICD-10-CM | POA: Insufficient documentation

## 2018-11-29 DIAGNOSIS — J449 Chronic obstructive pulmonary disease, unspecified: Secondary | ICD-10-CM

## 2018-11-29 MED ORDER — IOHEXOL 300 MG/ML  SOLN
100.0000 mL | Freq: Once | INTRAMUSCULAR | Status: AC | PRN
  Administered 2018-11-29: 14:00:00 100 mL via INTRAVENOUS

## 2018-11-29 MED ORDER — SODIUM CHLORIDE (PF) 0.9 % IJ SOLN
INTRAMUSCULAR | Status: AC
  Filled 2018-11-29: qty 50

## 2018-11-30 ENCOUNTER — Other Ambulatory Visit: Payer: Self-pay

## 2018-11-30 ENCOUNTER — Ambulatory Visit: Payer: Medicare Other | Admitting: Hematology and Oncology

## 2018-11-30 ENCOUNTER — Inpatient Hospital Stay (HOSPITAL_BASED_OUTPATIENT_CLINIC_OR_DEPARTMENT_OTHER): Payer: Medicare Other | Admitting: Hematology and Oncology

## 2018-11-30 ENCOUNTER — Encounter: Payer: Self-pay | Admitting: Hematology and Oncology

## 2018-11-30 DIAGNOSIS — C8308 Small cell B-cell lymphoma, lymph nodes of multiple sites: Secondary | ICD-10-CM | POA: Diagnosis not present

## 2018-11-30 DIAGNOSIS — D732 Chronic congestive splenomegaly: Secondary | ICD-10-CM

## 2018-11-30 DIAGNOSIS — J449 Chronic obstructive pulmonary disease, unspecified: Secondary | ICD-10-CM | POA: Diagnosis not present

## 2018-11-30 DIAGNOSIS — C9110 Chronic lymphocytic leukemia of B-cell type not having achieved remission: Secondary | ICD-10-CM | POA: Diagnosis not present

## 2018-11-30 DIAGNOSIS — I259 Chronic ischemic heart disease, unspecified: Secondary | ICD-10-CM | POA: Diagnosis not present

## 2018-11-30 NOTE — Progress Notes (Signed)
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 [facility]) I reviewed multiple imaging studies with the patient Overall, he has excellent response to therapy He is not in complete remission but overall no evidence of disease progression His cough is unrelated to his treatment I will continue port flush every other month and I will see him again in December for further follow-up He will continue treatment indefinitely  Splenomegaly, congestive, chronic His splenomegaly is improving This is likely due to positive response to treatment We will continue close observation  COPD mixed type [facility]) He has chronic cough since several months ago CT imaging show no abnormalities in his lungs I suspect he might have COPD I gave him a course of prednisone lately which he tolerated well and felt better I recommend him to follow-up with his primary care doctor or referral to pulmonology clinic for possible prescription of inhaled steroids Clinically, he has no signs of infection   No orders of the defined types were placed in this encounter.   INTERVAL HISTORY: Please see below for problem oriented charting. 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  No evidence of aortic dissection or other acute process in the chest. 2.  Centrilobular emphysema  with a 5 mm right lung nodule. Given the concurrent centrilobular emphysema, follow-up chest CT at 6 -12 months is recommended.  3.  Coronary artery atherosclerosis which is age advanced. 4.  Prominent thoracic lymph nodes.  These can be reevaluated at follow-up.   05/06/2010 Imaging   1.  Multiple small periaortic lymph nodes consistent with the patient's history of the chronic lymphocytic leukemia. 2.  No evidence of solid organ involvement   11/03/2011 Imaging   1.  Interval progression of abdominal and pelvic adenopathy. 2.  Progression of splenomegaly.  The spleen now measures 23 cm in length   11/18/2011 Bone Marrow Biopsy   Bone Marrow, Aspirate,Biopsy, and Clot, right iliac bone - HYPERCELLULAR BONE MARROW WITH EXTENSIVE INVOLVEMENT BY CHRONIC LYMPHOCYTIC LEUKEMIA. PERIPHERAL BLOOD: - CHRONIC LYMPHOCYTIC LEUKEMIA   03/08/2012 Imaging   1.  Progressive increase in retroperitoneal, iliac, and inguinal lymphadenopathy. 2.  Interval increase in massive splenomegaly.    06/01/2012 Procedure   Placement of single lumen port a cath via right internal jugular vein.  The catheter tip lies at the cavoatrial junction.  A power injectable port a cath was placed and is ready for immediate use   06/20/2012 - 11/23/2012 Chemotherapy   He received FCR x 6 cycles   12/19/2012 Imaging   Left common iliac stent. Abdominal vasculature remains patent. Improving supraclavicular and axillary lymphadenopathy. Residual right subpectoral nodes measure up to 10 mm short axis. Improving retroperitoneal lymphadenopathy, measuring up to 16 mm short axis. Improving splenomegaly, measuring 18.7 cm.    01/20/2016 Imaging   1. Stable exam.  No new or progressive findings.  2. No CT findings to explain odynophagia   09/02/2016 Pathology Results   The findings are consistent with involvement by previously known chronic lymphocytic leukemia   09/02/2016 Pathology Results   FISH for CLL came back positive for  deletion 13q   09/15/2016 Imaging   1. Borderline enlarged abdominal and pelvic lymph nodes. Compared with 11/03/2011 these are decreased in size as detailed above. 2. Persistent splenomegaly. 3. Aortic Atherosclerosis (ICD10-I70.0). LAD coronary artery calcification noted.   10/06/2016 - 02/24/2017 Chemotherapy   He received Bendamustine and Rituxan   11/03/2016 Adverse Reaction   Dose of Bendamustine is reduced due to severe pancytopenia   12/28/2016 Imaging   1. Borderline enlarged abdominal peritoneal ligament and abdominal retroperitoneal lymph nodes, stable. 2. Splenomegaly. 3.  Aortic atherosclerosis (ICD10-170.0).   03/22/2017 PET scan   1. No hypermetabolic adenopathy identified within the neck, chest, abdomen or pelvis. 2. Prominent left retroperitoneal node measures 1.6 cm without significant FDG uptake. 3. Splenomegaly. 4. Aortic Atherosclerosis (ICD10-I70.0) and Emphysema (ICD10-J43.9). LAD and left circumflex atherosclerotic calcifications noted.   02/02/2018 Procedure   IMPRESSION: Widely patent right IJ power port catheter.   04/06/2018 Imaging   1. Interval enlargement of axillary, mediastinal, and retroperitoneal lymph nodes, as well as increased splenomegaly, findings concerning for progression of CLL in comparison to prior PET-CT dated 03/22/2017.  2.  Other chronic and incidental findings as detailed above.    04/16/2018 -  Chemotherapy   The patient had acalabrutinib for chemo   04/17/2018 - 04/21/2018 Hospital Admission   He was hospitalized for influenza   11/29/2018 Imaging   1. Interval response to therapy. No thoracic added not scratch set no thoracic or pelvic adenopathy identified. Significant improvement in abdominal adenopathy. Largest remaining lymph node measures 1.3cm in the left retroperitoneal region. Previously 2.5 cm. 2. Persistent splenomegaly, improved from previous exam. 3. No new or progressive disease identified within the chest, abdomen or  pelvis. 4. Stable appearance of 5 mm right middle lobe lung nodule. 5. Aortic Atherosclerosis (ICD10-I70.0) and Emphysema (ICD10-J43.9). Coronary artery calcifications.       REVIEW OF SYSTEMS:   Constitutional: Denies fevers, chills or abnormal weight loss Eyes: Denies blurriness of vision Ears, nose, mouth, throat, and face: Denies mucositis or sore throat Cardiovascular: Denies palpitation, chest discomfort or lower extremity swelling Gastrointestinal:  Denies nausea, heartburn or change in bowel habits Skin: Denies abnormal skin rashes Lymphatics: Denies new lymphadenopathy  Neurological:Denies numbness, tingling or new weaknesses Behavioral/Psych: Mood is stable, no new changes  All other systems were reviewed with the patient and are negative.  I have reviewed the past medical history, past surgical history, social history and family history with the patient and they are unchanged from previous note.  ALLERGIES:  is allergic to codeine.  MEDICATIONS:  Current Outpatient Medications  Medication Sig Dispense Refill  . Acalabrutinib 100 MG CAPS Take 100 mg by mouth 2 (two) times daily.    Marland Kitchen acyclovir (ZOVIRAX) 400 MG tablet TAKE 1 TABLET BY MOUTH TWICE A DAY 180 tablet 1  . allopurinol (ZYLOPRIM) 300 MG tablet TAKE 1 TABLET BY MOUTH EVERY DAY 90 tablet 1  . ALPRAZolam (XANAX) 0.25 MG tablet Take 0.25 mg by mouth as needed for anxiety.    Marland Kitchen b complex vitamins tablet Take 1 tablet by mouth daily.     . benzonatate (TESSALON) 100 MG capsule Take 2 capsules (200 mg total) by mouth 2 (two) times daily as needed for cough. 20 capsule 0  . Cholecalciferol (  VITAMIN D-1000 MAX ST) 1000 units tablet Take 1,000 Units by mouth daily.     . famotidine (PEPCID) 20 MG tablet Take 20 mg by mouth 2 (two) times daily.     . fluticasone (FLONASE) 50 MCG/ACT nasal spray Place 2 sprays into the nose daily.     . furosemide (LASIX) 20 MG tablet TAKE 1 TABLET BY MOUTH EVERY DAY AS NEEDED 90 tablet 2   . HYDROcodone-acetaminophen (NORCO) 10-325 MG tablet Take 1 tablet by mouth See admin instructions. Every 6 to 8 hours as needed for pain  0  . lidocaine-prilocaine (EMLA) cream APPLY TO AFFECTED AREA ONCE (Patient taking differently: Apply 1 application topically once. ) 30 g 3  . loperamide (IMODIUM) 2 MG capsule TAKE 2 CAPSULES BY MOUTH AS NEEDED DIARRHEA OR LOOSE STOOLS 60 capsule 2  . meclizine (ANTIVERT) 12.5 MG tablet Take 12.5 mg by mouth every 6 (six) hours as needed for dizziness.   0  . metFORMIN (GLUCOPHAGE-XR) 500 MG 24 hr tablet Take 500 mg by mouth See admin instructions. Takes one tablet in AM and 2 tablets at night.    . metoprolol succinate (TOPROL-XL) 25 MG 24 hr tablet Take 0.5 tablets (12.5 mg total) by mouth daily. 90 tablet 3  . nitroGLYCERIN (NITROSTAT) 0.4 MG SL tablet PLACE 1 TABLET UNDER THE TONGUE EVERY 5 MINUTES X 3 DOSES AS NEEDED FOR CHEST PAIN *MAX 3 DOSES* (Patient taking differently: Place 0.4 mg under the tongue every 5 (five) minutes as needed for chest pain. ) 25 tablet 3  . nortriptyline (PAMELOR) 25 MG capsule TAKE 4 CAPSULES BY MOUTH AT BEDTIME. NEED TO SEE DOCTOR FOR REFILLS (CALL [phone]) 360 capsule 0  . predniSONE (DELTASONE) 20 MG tablet Take 2 tablets (40 mg total) by mouth daily with breakfast. 14 tablet 0  . pregabalin (LYRICA) 200 MG capsule One tab three times a day and 2 tabs at night 150 capsule 5  . PROVENTIL HFA 108 (90 Base) MCG/ACT inhaler TAKE 2 PUFFS BY MOUTH EVERY 6 HOURS AS NEEDED FOR WHEEZE OR SHORTNESS OF BREATH (Patient taking differently: Inhale 2 puffs into the lungs every 6 (six) hours as needed for wheezing or shortness of breath. ) 20.1 Inhaler 2  . rosuvastatin (CRESTOR) 5 MG tablet Take 1 tablet (5 mg total) by mouth daily. 90 tablet 3  . sildenafil (VIAGRA) 100 MG tablet Take 0.5-1 tablets (50-100 mg total) by mouth as needed for erectile dysfunction. 10 tablet 3  . zolpidem (AMBIEN) 10 MG tablet Take 10 mg by mouth at bedtime  as needed for sleep.     No current facility-administered medications for this visit.    Facility-Administered Medications Ordered in Other Visits  Medication Dose Route Frequency Provider Last Rate Last Dose  . 0.9 %  sodium chloride infusion   Intravenous Continuous Alvy Bimler, Alexsandro Salek, MD 50 mL/hr at 03/07/14 1005    . sodium chloride 0.9 % injection 10 mL  10 mL Intracatheter PRN Marcy Panning, MD   10 mL at 08/22/12 1721    PHYSICAL EXAMINATION: ECOG PERFORMANCE STATUS: 1 - Symptomatic but completely ambulatory  Vitals:   11/30/18 1413  BP: (!) 102/48  Pulse: 98  Resp: 18  Temp: 98.2 F (36.8 C)  SpO2: 99%   Filed Weights   11/30/18 1413  Weight: 196 lb 4.8 oz (89 kg)    GENERAL:alert, no distress and comfortable SKIN: Noted excessive skin bruises Musculoskeletal:no cyanosis of digits and no clubbing  NEURO: alert & oriented  x 3 with fluent speech, no focal motor/sensory deficits  LABORATORY DATA:  I have reviewed the data as listed    Component Value Date/Time   NA 141 11/23/2018 1235   NA 136 01/26/2017 0940   K 4.3 11/23/2018 1235   K 4.0 01/26/2017 0940   CL 107 11/23/2018 1235   CL 104 08/03/2012 1229   CO2 25 11/23/2018 1235   CO2 23 01/26/2017 0940   GLUCOSE 129 (H) 11/23/2018 1235   GLUCOSE 146 (H) 01/26/2017 0940   GLUCOSE 223 (H) 08/03/2012 1229   BUN 12 11/23/2018 1235   BUN 9.6 01/26/2017 0940   CREATININE 0.79 11/23/2018 1235   CREATININE 1.04 04/24/2018 0821   CREATININE 1.0 01/26/2017 0940   CALCIUM 8.8 (L) 11/23/2018 1235   CALCIUM 8.7 01/26/2017 0940   PROT 6.0 (L) 11/23/2018 1235   PROT 5.9 (L) 04/13/2017 0823   PROT 5.7 (L) 01/26/2017 0940   ALBUMIN 3.9 11/23/2018 1235   ALBUMIN 3.6 01/26/2017 0940   AST 12 (L) 11/23/2018 1235   AST 13 (L) 04/24/2018 0821   AST 13 01/26/2017 0940   ALT 10 11/23/2018 1235   ALT 10 04/24/2018 0821   ALT 12 01/26/2017 0940   ALKPHOS 112 11/23/2018 1235   ALKPHOS 78 01/26/2017 0940   BILITOT 0.8  11/23/2018 1235   BILITOT 0.7 04/24/2018 0821   BILITOT 0.64 01/26/2017 0940   GFRNONAA >60 11/23/2018 1235   GFRNONAA >60 04/24/2018 0821   GFRAA >60 11/23/2018 1235   GFRAA >60 04/24/2018 0821    No results found for: SPEP, UPEP  Lab Results  Component Value Date   WBC 10.5 11/23/2018   NEUTROABS 4.2 11/23/2018   HGB 14.6 11/23/2018   HCT 42.9 11/23/2018   MCV 102.6 (H) 11/23/2018   PLT 66 (L) 11/23/2018      Chemistry      Component Value Date/Time   NA 141 11/23/2018 1235   NA 136 01/26/2017 0940   K 4.3 11/23/2018 1235   K 4.0 01/26/2017 0940   CL 107 11/23/2018 1235   CL 104 08/03/2012 1229   CO2 25 11/23/2018 1235   CO2 23 01/26/2017 0940   BUN 12 11/23/2018 1235   BUN 9.6 01/26/2017 0940   CREATININE 0.79 11/23/2018 1235   CREATININE 1.04 04/24/2018 0821   CREATININE 1.0 01/26/2017 0940      Component Value Date/Time   CALCIUM 8.8 (L) 11/23/2018 1235   CALCIUM 8.7 01/26/2017 0940   ALKPHOS 112 11/23/2018 1235   ALKPHOS 78 01/26/2017 0940   AST 12 (L) 11/23/2018 1235   AST 13 (L) 04/24/2018 0821   AST 13 01/26/2017 0940   ALT 10 11/23/2018 1235   ALT 10 04/24/2018 0821   ALT 12 01/26/2017 0940   BILITOT 0.8 11/23/2018 1235   BILITOT 0.7 04/24/2018 0821   BILITOT 0.64 01/26/2017 0940       RADIOGRAPHIC STUDIES: I have reviewed multiple imaging studies with the patient I have personally reviewed the radiological images as listed and agreed with the findings in the report. Ct Chest W Contrast  Result Date: 11/29/2018 CLINICAL DATA:  Restaging chronic lymphocytic leukemia. EXAM: CT CHEST, ABDOMEN, AND PELVIS WITH CONTRAST TECHNIQUE: Multidetector CT imaging of the chest, abdomen and pelvis was performed following the standard protocol during bolus administration of intravenous contrast. CONTRAST:  172m OMNIPAQUE IOHEXOL 300 MG/ML  SOLN COMPARISON:  04/06/2018 FINDINGS: CT CHEST FINDINGS Cardiovascular: The heart size appears normal. Aortic  atherosclerosis. A each endovascular  stent is identified within the LAD. Calcifications in the left main, left circumflex and RCA coronary arteries noted. Mediastinum/Nodes: Normal appearance of the thyroid gland. The trachea appears patent and is midline. Normal appearance of the esophagus. The previously referenced right axillary lymph node measures 0.6 cm on today's exam, image 9/2. Previously 0.8 cm. No enlarged supraclavicular or left axillary lymph nodes. No mediastinal or hilar adenopathy identified. Lungs/Pleura: No pleural effusion identified. Calcified granuloma identified within the posterolateral right upper lobe. Unchanged 5 mm right middle lobe lung nodule, image 105/4. Mild changes of emphysema. Musculoskeletal: No chest wall mass or suspicious bone lesions identified. CT ABDOMEN PELVIS FINDINGS Hepatobiliary: No focal liver abnormality is seen. Status post cholecystectomy. No biliary dilatation. Pancreas: Unremarkable. No pancreatic ductal dilatation or surrounding inflammatory changes. Spleen: Splenomegaly is again noted. On today's exam the spleen has a length of 19.6 cm. Previously 23.7 cm. Adrenals/Urinary Tract: Normal appearance of the adrenal glands. Kidneys are unremarkable. The urinary bladder is normal. Stomach/Bowel: Stomach is within normal limits. Appendix appears normal. No evidence of bowel wall thickening, distention, or inflammatory changes. Vascular/Lymphatic: Aortic atherosclerosis. Status post endovascular stenting of the left common iliac artery. Multiple small retroperitoneal lymph nodes are identified, improved from previous exam. Index left retroperitoneal lymph node measures 1.3 cm, image 70/2. Previously 2.5 cm. No pelvic or inguinal adenopathy. Reproductive: Prostate is unremarkable. Other: No free fluid or fluid collections. Musculoskeletal: No acute or significant osseous findings. IMPRESSION: 1. Interval response to therapy. No thoracic added not scratch set no thoracic  or pelvic adenopathy identified. Significant improvement in abdominal adenopathy. Largest remaining lymph node measures 1.3 cm in the left retroperitoneal region. Previously 2.5 cm. 2. Persistent splenomegaly, improved from previous exam. 3. No new or progressive disease identified within the chest, abdomen or pelvis. 4. Stable appearance of 5 mm right middle lobe lung nodule. 5. Aortic Atherosclerosis (ICD10-I70.0) and Emphysema (ICD10-J43.9). Coronary artery calcifications. Electronically Signed   By: Kerby Moors M.D.   On: 11/29/2018 14:54   Ct Abdomen Pelvis W Contrast  Result Date: 11/29/2018 CLINICAL DATA:  Restaging chronic lymphocytic leukemia. EXAM: CT CHEST, ABDOMEN, AND PELVIS WITH CONTRAST TECHNIQUE: Multidetector CT imaging of the chest, abdomen and pelvis was performed following the standard protocol during bolus administration of intravenous contrast. CONTRAST:  196m OMNIPAQUE IOHEXOL 300 MG/ML  SOLN COMPARISON:  04/06/2018 FINDINGS: CT CHEST FINDINGS Cardiovascular: The heart size appears normal. Aortic atherosclerosis. A each endovascular stent is identified within the LAD. Calcifications in the left main, left circumflex and RCA coronary arteries noted. Mediastinum/Nodes: Normal appearance of the thyroid gland. The trachea appears patent and is midline. Normal appearance of the esophagus. The previously referenced right axillary lymph node measures 0.6 cm on today's exam, image 9/2. Previously 0.8 cm. No enlarged supraclavicular or left axillary lymph nodes. No mediastinal or hilar adenopathy identified. Lungs/Pleura: No pleural effusion identified. Calcified granuloma identified within the posterolateral right upper lobe. Unchanged 5 mm right middle lobe lung nodule, image 105/4. Mild changes of emphysema. Musculoskeletal: No chest wall mass or suspicious bone lesions identified. CT ABDOMEN PELVIS FINDINGS Hepatobiliary: No focal liver abnormality is seen. Status post cholecystectomy. No  biliary dilatation. Pancreas: Unremarkable. No pancreatic ductal dilatation or surrounding inflammatory changes. Spleen: Splenomegaly is again noted. On today's exam the spleen has a length of 19.6 cm. Previously 23.7 cm. Adrenals/Urinary Tract: Normal appearance of the adrenal glands. Kidneys are unremarkable. The urinary bladder is normal. Stomach/Bowel: Stomach is within normal limits. Appendix appears normal. No evidence of bowel  wall thickening, distention, or inflammatory changes. Vascular/Lymphatic: Aortic atherosclerosis. Status post endovascular stenting of the left common iliac artery. Multiple small retroperitoneal lymph nodes are identified, improved from previous exam. Index left retroperitoneal lymph node measures 1.3 cm, image 70/2. Previously 2.5 cm. No pelvic or inguinal adenopathy. Reproductive: Prostate is unremarkable. Other: No free fluid or fluid collections. Musculoskeletal: No acute or significant osseous findings. IMPRESSION: 1. Interval response to therapy. No thoracic added not scratch set no thoracic or pelvic adenopathy identified. Significant improvement in abdominal adenopathy. Largest remaining lymph node measures 1.3 cm in the left retroperitoneal region. Previously 2.5 cm. 2. Persistent splenomegaly, improved from previous exam. 3. No new or progressive disease identified within the chest, abdomen or pelvis. 4. Stable appearance of 5 mm right middle lobe lung nodule. 5. Aortic Atherosclerosis (ICD10-I70.0) and Emphysema (ICD10-J43.9). Coronary artery calcifications. Electronically Signed   By: Kerby Moors M.D.   On: 11/29/2018 14:54    All questions were answered. The patient knows to call the clinic with any problems, questions or concerns. No barriers to learning was detected.  I spent 15 minutes counseling the patient face to face. The total time spent in the appointment was 20 minutes and more than 50% was on counseling and review of test results  Ryan Lark,  MD 11/30/2018 4:02 PM

## 2018-11-30 NOTE — Assessment & Plan Note (Signed)
He has chronic cough since several months ago CT imaging show no abnormalities in his lungs I suspect he might have COPD I gave him a course of prednisone lately which he tolerated well and felt better I recommend him to follow-up with his primary care doctor or referral to pulmonology clinic for possible prescription of inhaled steroids Clinically, he has no signs of infection

## 2018-11-30 NOTE — Assessment & Plan Note (Signed)
I reviewed multiple imaging studies with the patient Overall, he has excellent response to therapy He is not in complete remission but overall no evidence of disease progression His cough is unrelated to his treatment I will continue port flush every other month and I will see him again in December for further follow-up He will continue treatment indefinitely

## 2018-11-30 NOTE — Assessment & Plan Note (Signed)
His splenomegaly is improving This is likely due to positive response to treatment We will continue close observation

## 2018-12-05 NOTE — Progress Notes (Signed)
CARDIOLOGY OFFICE NOTE  Date:  12/12/2018    [name] Date of Birth: [birth year] Medical Record #[id number]  PCP:  [name]., PA-C  Cardiologist:  Servando Snare Nahser    Chief Complaint  Patient presents with  . Follow-up    History of Present Illness: ALVIS EDGELL is a 68 y.o. male who presents today for a 6 month check.  Seen for Dr. Acie Fredrickson. Former patient of Dr. Susa Simmonds.Primarily follows with me.  Has known prior NSTEMI and stenting of the LAD in 2011. Underwent repeat cath due to refractory symptoms back in August of 2016 - managed medically. Other issues include tobacco abuse, PVD with past iliac stenting(previously followed at [facility]), HTN, HLD, DM, chronic thrombocytopenia and CLL- small cell B cell lymphoma. He sees neurology for his peripheral neuropathy.  Seen back in Septemberof 2017- had gained weight. Some intermittent chest heaviness - no response with NTG but I added Imdur to his regimen.When seenback in October - still with atypical chest pain - felt more like a pulled muscle but no relief with NSAID. He thought the Imdur had helped however. I talked with Dr. Acie Fredrickson - wanted to avoid Myoview - most likely it was felt to turn out abnormal. Elected to just follow for now.Ended uphaving GI work upinDecemberof 2017- this was felt to be possibly causing some of his prior chest pain -turned out to be a yeast infection and with treatment his chest pain resolved. We got him offImdur so he could resume Viagra. Still struggling with trying to stop smoking.He did have to go back on treatment for his CLL in 2018.   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 He had had a treatment reaction and had had hefty doses of Prednisone for a significant rash in the past. Had some swelling of his lower legs at a past visit with me - not short of breath. I put him on very low dose prn Lasix. Not smoking.  No chest pain. Cardiac status was felt to be stable.   I last saw him by a telehealth visit in April -he was doing well. He had been admitted in March with flu A. Metoprolol was to be cut back - he did not do that. Using Lasix just prn. Otherwise was stable from our standpoint.    The patient does not have symptoms concerning for COVID-19 infection (fever, chills, cough, or new shortness of breath).   Comes in today. Here alone. He has retired. Doing well. He is losing weight intentionally. Has lost over 30# in the past year. More active. Little lightheaded at times. No syncope. Worse with position changes. BP soft. He is staying hydrated. No chest pain. Breathing is good. Has a new dog - lots of scratches as a result of the dog. He feels like he is doing well. Needs lipids.   Past Medical History:  Diagnosis Date  . Anxiety 01/30/2014  . Back injury    lower disc  . CAD (coronary artery disease)   . CLL (chronic lymphocytic leukemia) ([place]) 03/18/2011  . Diabetes mellitus ([place])    Type 2   . ECRB (extensor carpi radialis brevis) tenosynovitis   . GERD (gastroesophageal reflux disease)    takes Nexium if needed  . Hyperlipidemia   . Lateral epicondylitis of left elbow   . MI, acute, non ST segment elevation ([place]) 06/28/2009   with stenting of the LAD  . Neuromuscular disorder (  [place])    peripheral neuropathy  . PVD (peripheral vascular disease) ([place])   . Tobacco abuse     Past Surgical History:  Procedure Laterality Date  . ADENOIDECTOMY  1955  . CARDIAC CATHETERIZATION    . CARDIAC CATHETERIZATION N/A 09/26/2014   Procedure: Left Heart Cath and Coronary Angiography;  Surgeon: Peter M Martinique, MD;  Location: [facility];  Service: Cardiovascular;  Laterality: N/A;  . carpel tunnel release Left 04-1989  . carpel tunnel release  Right 01-1989  . CHOLECYSTECTOMY  2007  . CORONARY STENT PLACEMENT  May 2011  . femoral stents    . IR CV LINE INJECTION  08/18/2017  . IR CV LINE  INJECTION  09/01/2017  . IR CV LINE INJECTION  02/02/2018  . LATERAL EPICONDYLE RELEASE Left 02/12/2014   Procedure: LEFT ELBOW DEBRIDEMENT WITH TENDON REPAIR ;  Surgeon: Lorn Junes, MD;  Location: [facility];  Service: Orthopedics;  Laterality: Left;  . LEFT CAI STENT/PTA AND POPLITEAL ARTERY/TIBIAL THROMBECTOMY     . LEFT HEART CATHETERIZATION WITH CORONARY ANGIOGRAM N/A 08/26/2011   Procedure: LEFT HEART CATHETERIZATION WITH CORONARY ANGIOGRAM;  Surgeon: Peter M Martinique, MD;  Location: [facility];  Service: Cardiovascular;  Laterality: N/A;  . PERIPHERAL VASCULAR CATHETERIZATION N/A 01/01/2015   Procedure: Abdominal Aortogram;  Surgeon: Conrad Depew, MD;  Location: [facility];  Service: Cardiovascular;  Laterality: N/A;  . TARSAL TUNNEL RELEASE Bilateral 08-2007     Medications: Current Meds  Medication Sig  . Acalabrutinib 100 MG CAPS Take 100 mg by mouth 2 (two) times daily.  Marland Kitchen acyclovir (ZOVIRAX) 400 MG tablet TAKE 1 TABLET BY MOUTH TWICE A DAY  . allopurinol (ZYLOPRIM) 300 MG tablet TAKE 1 TABLET BY MOUTH EVERY DAY  . ALPRAZolam (XANAX) 0.25 MG tablet Take 0.25 mg by mouth as needed for anxiety.  Marland Kitchen b complex vitamins tablet Take 1 tablet by mouth daily.   . benzonatate (TESSALON) 100 MG capsule Take 2 capsules (200 mg total) by mouth 2 (two) times daily as needed for cough.  . Cholecalciferol (VITAMIN D-1000 MAX ST) 1000 units tablet Take 1,000 Units by mouth daily.   . famotidine (PEPCID) 20 MG tablet Take 20 mg by mouth 2 (two) times daily.   . fluticasone (FLONASE) 50 MCG/ACT nasal spray Place 2 sprays into the nose daily.   . furosemide (LASIX) 20 MG tablet TAKE 1 TABLET BY MOUTH EVERY DAY AS NEEDED  . HYDROcodone-acetaminophen (NORCO) 10-325 MG tablet Take 1 tablet by mouth See admin instructions. Every 6 to 8 hours as needed for pain  . lidocaine-prilocaine (EMLA) cream APPLY TO AFFECTED AREA ONCE  . loperamide (IMODIUM) 2 MG capsule TAKE 2 CAPSULES BY MOUTH AS NEEDED  DIARRHEA OR LOOSE STOOLS  . meclizine (ANTIVERT) 12.5 MG tablet Take 12.5 mg by mouth every 6 (six) hours as needed for dizziness.   . metFORMIN (GLUCOPHAGE-XR) 500 MG 24 hr tablet Take 500 mg by mouth See admin instructions. Takes one tablet in AM and 2 tablets at night.  . nitroGLYCERIN (NITROSTAT) 0.4 MG SL tablet PLACE 1 TABLET UNDER THE TONGUE EVERY 5 MINUTES X 3 DOSES AS NEEDED FOR CHEST PAIN *MAX 3 DOSES*  . nortriptyline (PAMELOR) 25 MG capsule TAKE 4 CAPSULES BY MOUTH AT BEDTIME. NEED TO SEE DOCTOR FOR REFILLS (CALL [phone])  . pregabalin (LYRICA) 200 MG capsule One tab three times a day and 2 tabs at night  . PROVENTIL HFA 108 (90 Base) MCG/ACT inhaler  TAKE 2 PUFFS BY MOUTH EVERY 6 HOURS AS NEEDED FOR WHEEZE OR SHORTNESS OF BREATH  . rosuvastatin (CRESTOR) 5 MG tablet Take 1 tablet (5 mg total) by mouth daily.  . sildenafil (VIAGRA) 100 MG tablet Take 0.5-1 tablets (50-100 mg total) by mouth as needed for erectile dysfunction.  Marland Kitchen zolpidem (AMBIEN) 10 MG tablet Take 10 mg by mouth at bedtime as needed for sleep.  . [DISCONTINUED] metoprolol succinate (TOPROL-XL) 25 MG 24 hr tablet Take 0.5 tablets (12.5 mg total) by mouth daily.     Allergies: Allergies  Allergen Reactions  . Codeine Hives    Pt states he can take a few, more reaction with extended doses.    Social History: The patient  reports that he quit smoking about 2 years ago. His smoking use included cigarettes. He has a 22.00 pack-year smoking history. He has quit using smokeless tobacco. He reports that he does not drink alcohol or use drugs.   Family History: The patient's family history includes Cancer in his mother; Heart disease in his father; Heart failure (age of onset: 38) in his father; Lung cancer (age of onset: 56) in his mother.   Review of Systems: Please see the history of present illness.   All other systems are reviewed and negative.   Physical Exam: VS:  BP 108/68   Pulse 69   Ht 6\' 1"  (1.854  m)   Wt 190 lb 6.4 oz (86.4 kg)   SpO2 97%   BMI 25.12 kg/m  .  BMI Body mass index is 25.12 kg/m.  Wt Readings from Last 3 Encounters:  12/12/18 190 lb 6.4 oz (86.4 kg)  11/30/18 196 lb 4.8 oz (89 kg)  11/23/18 193 lb 12.8 oz (87.9 kg)    General: Pleasant. Well developed, well nourished and in no acute distress. His weight is down from 224 since our visit a year ago.   HEENT: Normal.  Neck: Supple, no JVD, carotid bruits, or masses noted.  Cardiac: Regular rate and rhythm. No murmurs, rubs, or gallops. No edema.  Respiratory:  Lungs are clear to auscultation bilaterally with normal work of breathing.  GI: Soft and nontender.  MS: No deformity or atrophy. Gait and ROM intact.  Skin: Warm and dry. Color is normal. He has lots of scratches/bruising on his arms.  Neuro:  Strength and sensation are intact and no gross focal deficits noted.  Psych: Alert, appropriate and with normal affect.   LABORATORY DATA:  EKG:  EKG is not ordered today.  Lab Results  Component Value Date   WBC 10.5 11/23/2018   HGB 14.6 11/23/2018   HCT 42.9 11/23/2018   PLT 66 (L) 11/23/2018   GLUCOSE 129 (H) 11/23/2018   CHOL 72 (L) 06/22/2018   TRIG 89 06/22/2018   HDL 24 (L) 06/22/2018   LDLDIRECT 117.6 08/22/2011   LDLCALC 30 06/22/2018   ALT 10 11/23/2018   AST 12 (L) 11/23/2018   NA 141 11/23/2018   K 4.3 11/23/2018   CL 107 11/23/2018   CREATININE 0.79 11/23/2018   BUN 12 11/23/2018   CO2 25 11/23/2018   TSH 1.620 04/13/2017   INR 1.0 09/22/2014   HGBA1C 6.2 (H) 04/13/2017     BNP (last 3 results) Recent Labs    04/18/18 0315  BNP 54.0    ProBNP (last 3 results) No results for input(s): PROBNP in the last 8760 hours.   Other Studies Reviewed Today:  Coronary angiography from 09/2014:  Coronary dominance: right  Left mainstem: Normal.  Left anterior descending (LAD): The stent in the proximal LAD is widely patent throughout. There is moderate 20% narrowing in the LAD  proximal to the stent. The remainder of the vessels without significant disease.  Left circumflex (LCx): The left circumflex gives rise to 2 large marginal branches. There is 20% narrowing prior to the takeoff of the first OM.  Right coronary artery (RCA): The right coronary is a dominant vessel. It has diffuse 70% disease in the proximal to mid vessel. Is occluded at the crux. There are excellent right to right and left to right collaterals.  Left ventriculography: Left ventricular systolic function is normal, LVEF is estimated at 55-65%, there is no significant mitral regurgitation  Final Conclusions:  1. Single vessel obstructive coronary disease. 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 The stent in the proximal LAD is widely patent.  2. Normal LV function.  Recommendations: Continue medical management.  Peter Martinique  08/26/2011, 9:09 AM  ASSESSMENT & PLAN:    1. CAD - stable cath from 2016 - has chronic total occlusion of RCA with good collateral flow - patent stent in proximal LAD. Doing well clinically without chest pain.   2. HTN - BP soft - stopping Toprol today. He will monitor and restart for consistent systolic above 017.   3. Swelling - resolved - not using any prn Lasix per his report.   4. HLD - needs lipids - other labs from earlier this month noted.   5. Past tobacco abuse - reports he is not smoking.   6. CLL/chronic thrombocytopenia - followed by oncology/hematology  7. NIDDM - per PCP - suspect this is improved with his weight loss.   8. Obesity - has actively lost weight  9. ED - on Viagra  10. PAD with priorL [facility] PTA+S, L pop artery thrombectomy by Dr. Amedeo Plenty- has not seen [facility] since 01/2016 - he has not felt that he needed follow up. Denies claudication. Has stopped smoking.   50. COVID-19 Education: The signs and symptoms of COVID-19 were discussed with the patient and how to seek care for testing (follow  up with PCP or arrange E-visit).  The importance of social distancing, staying at home, hand hygiene and wearing a mask when out in public were discussed today.  Current medicines are reviewed with the patient today.  The patient does not have concerns regarding medicines other than what has been noted above.  The following changes have been made:  See above.  Labs/ tests ordered today include:    Orders Placed This Encounter  Procedures  . Lipid panel     Disposition:   FU with me in 6 months.   Patient is agreeable to this plan and will call if any problems develop in the interim.   SignedTruitt Merle, NP  12/12/2018 8:34 AM  [facility] [address] [place], [place]  [address] Phone: [phone] Fax: [phone]

## 2018-12-06 ENCOUNTER — Telehealth: Payer: Self-pay | Admitting: Hematology and Oncology

## 2018-12-06 NOTE — Telephone Encounter (Signed)
I talk with patient regarding 12/4

## 2018-12-11 MED FILL — CALQUENCE 100 MG CAPSULE: 100 | 30 days supply | Qty: 60 | Fill #8

## 2018-12-12 ENCOUNTER — Encounter: Payer: Self-pay | Admitting: Nurse Practitioner

## 2018-12-12 ENCOUNTER — Ambulatory Visit (INDEPENDENT_AMBULATORY_CARE_PROVIDER_SITE_OTHER): Payer: Medicare Other | Admitting: Nurse Practitioner

## 2018-12-12 ENCOUNTER — Other Ambulatory Visit: Payer: Self-pay

## 2018-12-12 VITALS — BP 108/68 | HR 69 | Ht 73.0 in | Wt 190.4 lb

## 2018-12-12 DIAGNOSIS — Z7189 Other specified counseling: Secondary | ICD-10-CM | POA: Diagnosis not present

## 2018-12-12 DIAGNOSIS — E7800 Pure hypercholesterolemia, unspecified: Secondary | ICD-10-CM | POA: Diagnosis not present

## 2018-12-12 DIAGNOSIS — I259 Chronic ischemic heart disease, unspecified: Secondary | ICD-10-CM | POA: Diagnosis not present

## 2018-12-12 DIAGNOSIS — I10 Essential (primary) hypertension: Secondary | ICD-10-CM | POA: Diagnosis not present

## 2018-12-12 LAB — LIPID PANEL
Chol/HDL Ratio: 2.8 ratio (ref 0.0–5.0)
Cholesterol, Total: 77 mg/dL — ABNORMAL LOW (ref 100–199)
HDL: 28 mg/dL — ABNORMAL LOW (ref >39)
LDL Chol Calc (NIH): 31 mg/dL (ref 0–99)
Triglycerides: 91 mg/dL (ref 0–149)
VLDL Cholesterol Cal: 18 mg/dL (ref 5–40)

## 2018-12-12 NOTE — Patient Instructions (Addendum)
After Visit Summary:  We will be checking the following labs today - Lipids   Medication Instructions:    Continue with your current medicines. BUT  Ok to stop Toprol    If you need a refill on your cardiac medications before your next appointment, please call your pharmacy.     Testing/Procedures To Be Arranged:  N/A  Follow-Up:   See me in 6 months    At [facility], you and your health needs are our priority.  As part of our continuing mission to provide you with exceptional heart care, we have created designated Provider Care Teams.  These Care Teams include your primary Cardiologist (physician) and Advanced Practice Providers (APPs -  Physician Assistants and Nurse Practitioners) who all work together to provide you with the care you need, when you need it.  Special Instructions:  . Stay safe, stay home, wash your hands for at least 20 seconds and wear a mask when out in public.  . It was good to talk with you today.  Marland Kitchen Keep a check on your blood pressure for me - ok to resume your Toprol if BP starts trending above 889 systolic consistently.    Call the [place] office at [phone] if you have any questions, problems or concerns.

## 2018-12-21 ENCOUNTER — Other Ambulatory Visit: Payer: Self-pay | Admitting: Neurology

## 2018-12-31 ENCOUNTER — Telehealth: Payer: Self-pay | Admitting: Neurology

## 2018-12-31 NOTE — Telephone Encounter (Signed)
lvm to r/s 11/17 appt due to NP being out

## 2019-01-01 ENCOUNTER — Ambulatory Visit: Payer: Medicare Other | Admitting: Neurology

## 2019-01-07 MED FILL — CALQUENCE 100 MG CAPSULE: 100 | 30 days supply | Qty: 60 | Fill #9

## 2019-01-08 ENCOUNTER — Telehealth: Payer: Self-pay

## 2019-01-08 NOTE — Telephone Encounter (Signed)
-----   Message from [name], MD sent at 01/08/2019 10:08 AM EST ----- Regarding: appt on 12/4 can he come in sooner?

## 2019-01-08 NOTE — Telephone Encounter (Signed)
Called and given below message. Appts reschedule to a earlier time. Times given.

## 2019-01-15 ENCOUNTER — Other Ambulatory Visit: Payer: Self-pay | Admitting: Hematology and Oncology

## 2019-01-15 DIAGNOSIS — C9110 Chronic lymphocytic leukemia of B-cell type not having achieved remission: Secondary | ICD-10-CM

## 2019-01-18 ENCOUNTER — Inpatient Hospital Stay: Payer: Medicare Other

## 2019-01-18 ENCOUNTER — Inpatient Hospital Stay: Payer: Medicare Other | Attending: Hematology and Oncology | Admitting: Hematology and Oncology

## 2019-01-18 ENCOUNTER — Ambulatory Visit: Payer: Medicare Other | Admitting: Hematology and Oncology

## 2019-01-18 ENCOUNTER — Other Ambulatory Visit: Payer: Medicare Other

## 2019-01-18 ENCOUNTER — Other Ambulatory Visit: Payer: Self-pay

## 2019-01-18 ENCOUNTER — Encounter: Payer: Self-pay | Admitting: Hematology and Oncology

## 2019-01-18 DIAGNOSIS — C8308 Small cell B-cell lymphoma, lymph nodes of multiple sites: Secondary | ICD-10-CM

## 2019-01-18 DIAGNOSIS — C9110 Chronic lymphocytic leukemia of B-cell type not having achieved remission: Secondary | ICD-10-CM | POA: Diagnosis present

## 2019-01-18 DIAGNOSIS — Z95828 Presence of other vascular implants and grafts: Secondary | ICD-10-CM

## 2019-01-18 DIAGNOSIS — I259 Chronic ischemic heart disease, unspecified: Secondary | ICD-10-CM

## 2019-01-18 DIAGNOSIS — D696 Thrombocytopenia, unspecified: Secondary | ICD-10-CM | POA: Diagnosis not present

## 2019-01-18 DIAGNOSIS — Z79899 Other long term (current) drug therapy: Secondary | ICD-10-CM | POA: Insufficient documentation

## 2019-01-18 DIAGNOSIS — J449 Chronic obstructive pulmonary disease, unspecified: Secondary | ICD-10-CM

## 2019-01-18 DIAGNOSIS — D61818 Other pancytopenia: Secondary | ICD-10-CM | POA: Insufficient documentation

## 2019-01-18 DIAGNOSIS — R161 Splenomegaly, not elsewhere classified: Secondary | ICD-10-CM | POA: Insufficient documentation

## 2019-01-18 DIAGNOSIS — D801 Nonfamilial hypogammaglobulinemia: Secondary | ICD-10-CM

## 2019-01-18 LAB — CBC WITH DIFFERENTIAL/PLATELET
Abs Immature Granulocytes: 0.2 10*3/uL — ABNORMAL HIGH (ref 0.00–0.07)
Basophils Absolute: 0 10*3/uL (ref 0.0–0.1)
Basophils Relative: 0 %
Eosinophils Absolute: 0.1 10*3/uL (ref 0.0–0.5)
Eosinophils Relative: 1 %
HCT: 40 % (ref 39.0–52.0)
Hemoglobin: 13.8 g/dL (ref 13.0–17.0)
Immature Granulocytes: 2 %
Lymphocytes Relative: 37 %
Lymphs Abs: 3.5 10*3/uL (ref 0.7–4.0)
MCH: 35.8 pg — ABNORMAL HIGH (ref 26.0–34.0)
MCHC: 34.5 g/dL (ref 30.0–36.0)
MCV: 103.6 fL — ABNORMAL HIGH (ref 80.0–100.0)
Monocytes Absolute: 0.3 10*3/uL (ref 0.1–1.0)
Monocytes Relative: 3 %
Neutro Abs: 5.4 10*3/uL (ref 1.7–7.7)
Neutrophils Relative %: 57 %
Platelets: 52 10*3/uL — ABNORMAL LOW (ref 150–400)
RBC: 3.86 MIL/uL — ABNORMAL LOW (ref 4.22–5.81)
RDW: 17 % — ABNORMAL HIGH (ref 11.5–15.5)
WBC: 9.5 10*3/uL (ref 4.0–10.5)
nRBC: 0 % (ref 0.0–0.2)

## 2019-01-18 LAB — COMPREHENSIVE METABOLIC PANEL
ALT: 11 U/L (ref 0–44)
AST: 9 U/L — ABNORMAL LOW (ref 15–41)
Albumin: 3.6 g/dL (ref 3.5–5.0)
Alkaline Phosphatase: 99 U/L (ref 38–126)
Anion gap: 7 (ref 5–15)
BUN: 9 mg/dL (ref 8–23)
CO2: 28 mmol/L (ref 22–32)
Calcium: 8.5 mg/dL — ABNORMAL LOW (ref 8.9–10.3)
Chloride: 105 mmol/L (ref 98–111)
Creatinine, Ser: 0.81 mg/dL (ref 0.61–1.24)
GFR calc Af Amer: >60 mL/min (ref >60)
GFR calc non Af Amer: >60 mL/min (ref >60)
Glucose, Bld: 150 mg/dL — ABNORMAL HIGH (ref 70–99)
Potassium: 3.7 mmol/L (ref 3.5–5.1)
Sodium: 140 mmol/L (ref 135–145)
Total Bilirubin: 0.8 mg/dL (ref 0.3–1.2)
Total Protein: 5.3 g/dL — ABNORMAL LOW (ref 6.5–8.1)

## 2019-01-18 MED ORDER — ALBUTEROL SULFATE HFA 108 (90 BASE) MCG/ACT IN AERS: INHALATION_SPRAY | RESPIRATORY_TRACT | 11 refills | Status: DC

## 2019-01-18 MED ORDER — SODIUM CHLORIDE 0.9% FLUSH
10.0000 mL | Freq: Once | INTRAVENOUS | Status: AC
  Administered 2019-01-18: 10 mL
  Filled 2019-01-18: qty 10

## 2019-01-18 MED ORDER — HEPARIN SOD (PORK) LOCK FLUSH 100 UNIT/ML IV SOLN
250.0000 [IU] | Freq: Once | INTRAVENOUS | Status: AC
  Administered 2019-01-18: 09:00:00 250 [IU]
  Filled 2019-01-18: qty 5

## 2019-01-18 NOTE — Assessment & Plan Note (Signed)
His last imaging studies showed excellent response to therapy He is not in complete remission but overall no evidence of disease progression I will continue port flush every other month and I will see him again in January for further follow-up He will continue treatment indefinitely

## 2019-01-18 NOTE — Progress Notes (Signed)
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  Observe only for now.  COPD mixed type ([place]) He was recently treated with a course of antibiotics and prednisone He still have expiratory wheezes on exam I refilled his prescription of albuterol   No orders of the defined types were placed in this encounter.   INTERVAL HISTORY: Please see below for problem oriented charting. He returns for further follow-up He was recently seen by his primary care doctor and was prescribed levofloxacin and prednisone His cough is improved but he still have shortness of breath on exertion and fatigue He tolerated chemotherapy well without new side effects He continues to have frequent bruising The patient denies any recent signs or symptoms of bleeding such as spontaneous epistaxis, hematuria or hematochezia.   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  No evidence of aortic dissection or other acute process in the chest. 2.  Centrilobular emphysema with a 5 mm right lung nodule. Given the concurrent  centrilobular emphysema, follow-up chest CT at 6 -12 months is recommended.  3.  Coronary artery atherosclerosis which is age advanced. 4.  Prominent thoracic lymph nodes.  These can be reevaluated at follow-up.   05/06/2010 Imaging   1.  Multiple small periaortic lymph nodes consistent with the patient's history of the chronic lymphocytic leukemia. 2.  No evidence of solid organ involvement   11/03/2011 Imaging   1.  Interval progression of abdominal and pelvic adenopathy. 2.  Progression of splenomegaly.  The spleen now measures 23 cm in length   11/18/2011 Bone Marrow Biopsy   Bone Marrow, Aspirate,Biopsy, and Clot, right iliac bone - HYPERCELLULAR BONE MARROW WITH EXTENSIVE INVOLVEMENT BY CHRONIC LYMPHOCYTIC LEUKEMIA. PERIPHERAL BLOOD: - CHRONIC LYMPHOCYTIC LEUKEMIA   03/08/2012 Imaging   1.  Progressive increase in retroperitoneal, iliac, and inguinal lymphadenopathy. 2.  Interval increase in massive splenomegaly.    06/01/2012 Procedure   Placement of single lumen port a cath via right internal jugular vein.  The catheter tip lies at the cavoatrial junction.  A power injectable port a cath was placed and is ready for immediate use   06/20/2012 - 11/23/2012 Chemotherapy   He received FCR x 6 cycles   12/19/2012 Imaging   Left common iliac stent. Abdominal vasculature remains patent. Improving supraclavicular and axillary lymphadenopathy. Residual right subpectoral nodes measure up to 10 mm short axis. Improving retroperitoneal lymphadenopathy, measuring up to 16 mm short axis. Improving splenomegaly, measuring 18.7 cm.    01/20/2016 Imaging   1. Stable exam.  No new or progressive findings. 2. No CT findings to explain odynophagia   09/02/2016 Pathology Results   The findings are consistent with  involvement by previously known chronic lymphocytic leukemia   09/02/2016 Pathology Results   FISH for CLL came back positive for deletion 13q   09/15/2016 Imaging   1. Borderline  enlarged abdominal and pelvic lymph nodes. Compared with 11/03/2011 these are decreased in size as detailed above. 2. Persistent splenomegaly. 3. Aortic Atherosclerosis (ICD10-I70.0). LAD coronary artery calcification noted.   10/06/2016 - 02/24/2017 Chemotherapy   He received Bendamustine and Rituxan   11/03/2016 Adverse Reaction   Dose of Bendamustine is reduced due to severe pancytopenia   12/28/2016 Imaging   1. Borderline enlarged abdominal peritoneal ligament and abdominal retroperitoneal lymph nodes, stable. 2. Splenomegaly. 3.  Aortic atherosclerosis (ICD10-170.0).   03/22/2017 PET scan   1. No hypermetabolic adenopathy identified within the neck, chest, abdomen or pelvis. 2. Prominent left retroperitoneal node measures 1.6 cm without significant FDG uptake. 3. Splenomegaly. 4. Aortic Atherosclerosis (ICD10-I70.0) and Emphysema (ICD10-J43.9). LAD and left circumflex atherosclerotic calcifications noted.   02/02/2018 Procedure   IMPRESSION: Widely patent right IJ power port catheter.   04/06/2018 Imaging   1. Interval enlargement of axillary, mediastinal, and retroperitoneal lymph nodes, as well as increased splenomegaly, findings concerning for progression of CLL in comparison to prior PET-CT dated 03/22/2017.  2.  Other chronic and incidental findings as detailed above.    04/16/2018 -  Chemotherapy   The patient had acalabrutinib for chemo   04/17/2018 - 04/21/2018 Hospital Admission   He was hospitalized for influenza   11/29/2018 Imaging   1. Interval response to therapy. No thoracic added not scratch set no thoracic or pelvic adenopathy identified. Significant improvement in abdominal adenopathy. Largest remaining lymph node measures 1.3cm in the left retroperitoneal region. Previously 2.5 cm. 2. Persistent splenomegaly, improved from previous exam. 3. No new or progressive disease identified within the chest, abdomen or pelvis. 4. Stable appearance of 5 mm right middle  lobe lung nodule. 5. Aortic Atherosclerosis (ICD10-I70.0) and Emphysema (ICD10-J43.9). Coronary artery calcifications.       REVIEW OF SYSTEMS:   Constitutional: Denies fevers, chills or abnormal weight loss Eyes: Denies blurriness of vision Ears, nose, mouth, throat, and face: Denies mucositis or sore throat Cardiovascular: Denies palpitation, chest discomfort or lower extremity swelling Gastrointestinal:  Denies nausea, heartburn or change in bowel habits Skin: Denies abnormal skin rashes Lymphatics: Denies new lymphadenopathy  Neurological:Denies numbness, tingling or new weaknesses Behavioral/Psych: Mood is stable, no new changes  All other systems were reviewed with the patient and are negative.  I have reviewed the past medical history, past surgical history, social history and family history with the patient and they are unchanged from previous note.  ALLERGIES:  is allergic to codeine.  MEDICATIONS:  Current Outpatient Medications  Medication Sig Dispense Refill  . Acalabrutinib 100 MG CAPS Take 100 mg by mouth 2 (two) times daily.    Marland Kitchen acyclovir (ZOVIRAX) 400 MG tablet TAKE 1 TABLET BY MOUTH TWICE A DAY 180 tablet 1  . albuterol (PROVENTIL HFA) 108 (90 Base) MCG/ACT inhaler TAKE 2 PUFFS BY MOUTH EVERY 6 HOURS AS NEEDED FOR WHEEZE OR SHORTNESS OF BREATH 18 g 11  . allopurinol (ZYLOPRIM) 300 MG tablet TAKE 1 TABLET BY MOUTH EVERY DAY 90 tablet 1  . ALPRAZolam (XANAX) 0.25 MG tablet Take 0.25 mg by mouth as needed for anxiety.    Marland Kitchen b complex vitamins tablet Take 1 tablet by mouth daily.     . benzonatate (TESSALON) 100 MG capsule Take 2 capsules (200 mg total) by mouth 2 (two) times daily  as needed for cough. 20 capsule 0  . Cholecalciferol (VITAMIN D-1000 MAX ST) 1000 units tablet Take 1,000 Units by mouth daily.     . famotidine (PEPCID) 20 MG tablet Take 20 mg by mouth 2 (two) times daily.     . fluticasone (FLONASE) 50 MCG/ACT nasal spray Place 2 sprays into the nose  daily.     . furosemide (LASIX) 20 MG tablet TAKE 1 TABLET BY MOUTH EVERY DAY AS NEEDED 90 tablet 2  . HYDROcodone-acetaminophen (NORCO) 10-325 MG tablet Take 1 tablet by mouth See admin instructions. Every 6 to 8 hours as needed for pain  0  . lidocaine-prilocaine (EMLA) cream APPLY TO AFFECTED AREA ONCE 30 g 3  . loperamide (IMODIUM) 2 MG capsule TAKE 2 CAPSULES BY MOUTH AS NEEDED DIARRHEA OR LOOSE STOOLS 60 capsule 2  . meclizine (ANTIVERT) 12.5 MG tablet Take 12.5 mg by mouth every 6 (six) hours as needed for dizziness.   0  . metFORMIN (GLUCOPHAGE-XR) 500 MG 24 hr tablet Take 500 mg by mouth See admin instructions. Takes one tablet in AM and 2 tablets at night.    . nitroGLYCERIN (NITROSTAT) 0.4 MG SL tablet PLACE 1 TABLET UNDER THE TONGUE EVERY 5 MINUTES X 3 DOSES AS NEEDED FOR CHEST PAIN *MAX 3 DOSES* 25 tablet 3  . nortriptyline (PAMELOR) 25 MG capsule TAKE 4 CAPSULES BY MOUTH AT BEDTIME. NEED TO SEE DOCTOR FOR REFILLS (CALL [phone]) 360 capsule 0  . pregabalin (LYRICA) 200 MG capsule One tab three times a day and 2 tabs at night 150 capsule 5  . rosuvastatin (CRESTOR) 5 MG tablet Take 1 tablet (5 mg total) by mouth daily. 90 tablet 3  . sildenafil (VIAGRA) 100 MG tablet Take 0.5-1 tablets (50-100 mg total) by mouth as needed for erectile dysfunction. 10 tablet 3  . zolpidem (AMBIEN) 10 MG tablet Take 10 mg by mouth at bedtime as needed for sleep.     No current facility-administered medications for this visit.    Facility-Administered Medications Ordered in Other Visits  Medication Dose Route Frequency Provider Last Rate Last Dose  . 0.9 %  sodium chloride infusion   Intravenous Continuous Alvy Bimler, Taraji Mungo, MD 50 mL/hr at 03/07/14 1005    . sodium chloride 0.9 % injection 10 mL  10 mL Intracatheter PRN Marcy Panning, MD   10 mL at 08/22/12 1721    PHYSICAL EXAMINATION: ECOG PERFORMANCE STATUS: 1 - Symptomatic but completely ambulatory  Vitals:   01/18/19 0932  BP: 100/60  Pulse:  71  Resp: 18  Temp: 97.8 F (36.6 C)  SpO2: 97%   Filed Weights   01/18/19 0932  Weight: 189 lb 3.2 oz (85.8 kg)    GENERAL:alert, no distress and comfortable SKIN: Noted extensive skin bruises EYES: normal, Conjunctiva are pink and non-injected, sclera clear OROPHARYNX:no exudate, no erythema and lips, buccal mucosa, and tongue normal  NECK: supple, thyroid normal size, non-tender, without nodularity LYMPH:  no palpable lymphadenopathy in the cervical, axillary or inguinal LUNGS: He has expiratory wheezes HEART: regular rate & rhythm and no murmurs and no lower extremity edema ABDOMEN:abdomen soft, non-tender and normal bowel sounds Musculoskeletal:no cyanosis of digits and no clubbing  NEURO: alert & oriented x 3 with fluent speech, no focal motor/sensory deficits  LABORATORY DATA:  I have reviewed the data as listed    Component Value Date/Time   NA 140 01/18/2019 0917   NA 136 01/26/2017 0940   K 3.7 01/18/2019 0917   K  4.0 01/26/2017 0940   CL 105 01/18/2019 0917   CL 104 08/03/2012 1229   CO2 28 01/18/2019 0917   CO2 23 01/26/2017 0940   GLUCOSE 150 (H) 01/18/2019 0917   GLUCOSE 146 (H) 01/26/2017 0940   GLUCOSE 223 (H) 08/03/2012 1229   BUN 9 01/18/2019 0917   BUN 9.6 01/26/2017 0940   CREATININE 0.81 01/18/2019 0917   CREATININE 1.04 04/24/2018 0821   CREATININE 1.0 01/26/2017 0940   CALCIUM 8.5 (L) 01/18/2019 0917   CALCIUM 8.7 01/26/2017 0940   PROT 5.3 (L) 01/18/2019 0917   PROT 5.9 (L) 04/13/2017 0823   PROT 5.7 (L) 01/26/2017 0940   ALBUMIN 3.6 01/18/2019 0917   ALBUMIN 3.6 01/26/2017 0940   AST 9 (L) 01/18/2019 0917   AST 13 (L) 04/24/2018 0821   AST 13 01/26/2017 0940   ALT 11 01/18/2019 0917   ALT 10 04/24/2018 0821   ALT 12 01/26/2017 0940   ALKPHOS 99 01/18/2019 0917   ALKPHOS 78 01/26/2017 0940   BILITOT 0.8 01/18/2019 0917   BILITOT 0.7 04/24/2018 0821   BILITOT 0.64 01/26/2017 0940   GFRNONAA >60 01/18/2019 0917   GFRNONAA >60  04/24/2018 0821   GFRAA >60 01/18/2019 0917   GFRAA >60 04/24/2018 0821    No results found for: SPEP, UPEP  Lab Results  Component Value Date   WBC 9.5 01/18/2019   NEUTROABS 5.4 01/18/2019   HGB 13.8 01/18/2019   HCT 40.0 01/18/2019   MCV 103.6 (H) 01/18/2019   PLT 52 (L) 01/18/2019      Chemistry      Component Value Date/Time   NA 140 01/18/2019 0917   NA 136 01/26/2017 0940   K 3.7 01/18/2019 0917   K 4.0 01/26/2017 0940   CL 105 01/18/2019 0917   CL 104 08/03/2012 1229   CO2 28 01/18/2019 0917   CO2 23 01/26/2017 0940   BUN 9 01/18/2019 0917   BUN 9.6 01/26/2017 0940   CREATININE 0.81 01/18/2019 0917   CREATININE 1.04 04/24/2018 0821   CREATININE 1.0 01/26/2017 0940      Component Value Date/Time   CALCIUM 8.5 (L) 01/18/2019 0917   CALCIUM 8.7 01/26/2017 0940   ALKPHOS 99 01/18/2019 0917   ALKPHOS 78 01/26/2017 0940   AST 9 (L) 01/18/2019 0917   AST 13 (L) 04/24/2018 0821   AST 13 01/26/2017 0940   ALT 11 01/18/2019 0917   ALT 10 04/24/2018 0821   ALT 12 01/26/2017 0940   BILITOT 0.8 01/18/2019 0917   BILITOT 0.7 04/24/2018 0821   BILITOT 0.64 01/26/2017 0940       All questions were answered. The patient knows to call the clinic with any problems, questions or concerns. No barriers to learning was detected.  I spent 15 minutes counseling the patient face to face. The total time spent in the appointment was 20 minutes and more than 50% was on counseling and review of test results  [name], MD 01/18/2019 10:17 AM

## 2019-01-18 NOTE — Assessment & Plan Note (Signed)
He has persistent pancytopenia due CLL, liver disease and chronic splenomegaly He is not symptomatic except for minor bruising.  Observe only for now.

## 2019-01-18 NOTE — Assessment & Plan Note (Signed)
He was recently treated with a course of antibiotics and prednisone He still have expiratory wheezes on exam I refilled his prescription of albuterol

## 2019-01-22 ENCOUNTER — Telehealth: Payer: Self-pay | Admitting: Hematology and Oncology

## 2019-01-22 NOTE — Telephone Encounter (Signed)
Scheduled appt per 1/24 sch message - pt is aware of appt date and time   

## 2019-02-05 ENCOUNTER — Encounter: Payer: Self-pay | Admitting: Neurology

## 2019-02-05 ENCOUNTER — Other Ambulatory Visit: Payer: Self-pay

## 2019-02-05 ENCOUNTER — Encounter

## 2019-02-05 ENCOUNTER — Ambulatory Visit (INDEPENDENT_AMBULATORY_CARE_PROVIDER_SITE_OTHER): Payer: Medicare Other | Admitting: Neurology

## 2019-02-05 VITALS — BP 101/64 | HR 71 | Temp 97.0°F | Ht 72.0 in | Wt 189.4 lb

## 2019-02-05 DIAGNOSIS — G609 Hereditary and idiopathic neuropathy, unspecified: Secondary | ICD-10-CM

## 2019-02-05 MED ORDER — NORTRIPTYLINE HCL 25 MG PO CAPS: ORAL_CAPSULE | ORAL | 1 refills | Status: DC

## 2019-02-05 MED ORDER — PREGABALIN 200 MG PO CAPS: 200.0000 mg | ORAL_CAPSULE | Freq: Three times a day (TID) | ORAL | 1 refills | Status: DC

## 2019-02-05 NOTE — Patient Instructions (Signed)
Try Lyrica 200 mg three times daily for your pain, continue nortriptyline 100 mg at bedtime.   Return in 6 months or sooner if needed

## 2019-02-05 NOTE — Progress Notes (Signed)
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, chronic lymphocytic leukemia, no evidence of solid organ involvement, he received chemotherapy FCR x6 cycles from May 2014 to October 2014, chemotherapyBendamustineand and rituximab from October 06, 2016 to February 24, 2017,  Most recent CAT scan on March 22, 5828, no hypermetabolic adenopathy identified within the neck, chest, abdomen or pelvic, prominent left retroperitoneal lymph node measuring 1.6 cm without significant FDG uptake, splenomegaly,  He has diabetes since 2012, still works as an Chief Financial Officer,  His diabetic symptoms started around 2009, he noticed bilateral feet numbness tingling, at the bottom of his feet, actually underwent bilateral tarsal tunnel release surgery did not help his symptoms, reported abnormal EMG nerve conduction study in the past from outside office, never had a repeat study in the laboratory,  Reviewing the record, previously had evidence of vitamin B12, has been on vitamin B12 IM supplement since,  He has tried and failed gabapentin, Cymbalta, Topamax, currently taking Lyrica 100 mg 3 times a day +200 mg at bedtime, and gabapentin 300 mg twice a day.  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  For the last year he has been on Medicare after retirement.  He has been prescribed Lyrica 200 mg capsule, 3 times daily, and 2 at night.  He says Medicare will not pay for this dose, his commercial  insurance did prior.  He has been taking Lyrica 200 mg once daily.  He indicates he has continued pain in his feet, from his toes to his midfoot.  He sleeps well at night, he has remained active, but reports if he were to be working now, he would not be able to due to pain.  He denies any falls.  He has history of CLL, reports has been overall stable, no evidence of disease progression.  Has follow-up with Ryan Copeland.  He is also taking nortriptyline 100 mg at bedtime. 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   REVIEW OF SYSTEMS: Out of a complete 14 system review of symptoms, the patient complains only of the following symptoms, and all other reviewed systems are negative.  Foot pain   ALLERGIES: Allergies  Allergen Reactions  . Codeine Hives    Pt states he can take a few, more reaction with extended doses.    HOME MEDICATIONS: Outpatient Medications Prior to Visit  Medication Sig Dispense Refill  . Acalabrutinib 100 MG CAPS Take 100 mg by mouth 2 (two) times daily.    Marland Kitchen acyclovir (ZOVIRAX) 400 MG tablet TAKE 1 TABLET BY MOUTH TWICE A DAY 180 tablet 1  . albuterol (PROVENTIL HFA) 108 (90 Base) MCG/ACT inhaler TAKE 2 PUFFS BY MOUTH EVERY 6 HOURS AS NEEDED FOR WHEEZE OR SHORTNESS OF BREATH 18 g 11  . allopurinol (ZYLOPRIM) 300 MG tablet TAKE 1 TABLET BY MOUTH EVERY DAY 90 tablet 1  . ALPRAZolam (XANAX) 0.25 MG tablet Take 0.25 mg by mouth as needed for anxiety.    Marland Kitchen b complex vitamins tablet Take 1 tablet by  mouth daily.     . benzonatate (TESSALON) 100 MG capsule Take 2 capsules (200 mg total) by mouth 2 (two) times daily as needed for cough. 20 capsule 0  . Cholecalciferol (VITAMIN D-1000 MAX ST) 1000 units tablet Take 1,000 Units by mouth daily.     . famotidine (PEPCID) 20 MG tablet Take 20 mg by mouth 2 (two) times daily.     . fluticasone (FLONASE) 50 MCG/ACT nasal spray Place 2 sprays into the nose daily.     . furosemide (LASIX) 20 MG tablet TAKE 1 TABLET BY MOUTH EVERY DAY  AS NEEDED 90 tablet 2  . HYDROcodone-acetaminophen (NORCO) 10-325 MG tablet Take 1 tablet by mouth See admin instructions. Every 6 to 8 hours as needed for pain  0  . lidocaine-prilocaine (EMLA) cream APPLY TO AFFECTED AREA ONCE 30 g 3  . loperamide (IMODIUM) 2 MG capsule TAKE 2 CAPSULES BY MOUTH AS NEEDED DIARRHEA OR LOOSE STOOLS 60 capsule 2  . meclizine (ANTIVERT) 12.5 MG tablet Take 12.5 mg by mouth every 6 (six) hours as needed for dizziness.   0  . metFORMIN (GLUCOPHAGE-XR) 500 MG 24 hr tablet Take 500 mg by mouth See admin instructions. Takes one tablet in AM and 2 tablets at night.    . nitroGLYCERIN (NITROSTAT) 0.4 MG SL tablet PLACE 1 TABLET UNDER THE TONGUE EVERY 5 MINUTES X 3 DOSES AS NEEDED FOR CHEST PAIN *MAX 3 DOSES* 25 tablet 3  . rosuvastatin (CRESTOR) 5 MG tablet Take 1 tablet (5 mg total) by mouth daily. 90 tablet 3  . sildenafil (VIAGRA) 100 MG tablet Take 0.5-1 tablets (50-100 mg total) by mouth as needed for erectile dysfunction. 10 tablet 3  . zolpidem (AMBIEN) 10 MG tablet Take 10 mg by mouth at bedtime as needed for sleep.    . nortriptyline (PAMELOR) 25 MG capsule TAKE 4 CAPSULES BY MOUTH AT BEDTIME. NEED TO SEE DOCTOR FOR REFILLS (CALL [phone]) 360 capsule 0  . pregabalin (LYRICA) 200 MG capsule One tab three times a day and 2 tabs at night 150 capsule 5   Facility-Administered Medications Prior to Visit  Medication Dose Route Frequency Provider Last Rate Last Admin  . 0.9 %  sodium chloride infusion   Intravenous Continuous [name], MD 50 mL/hr at 03/07/14 1005 New Bag at 03/07/14 1005  . sodium chloride 0.9 % injection 10 mL  10 mL Intracatheter PRN Marcy Panning, MD   10 mL at 08/22/12 1721    PAST MEDICAL HISTORY: Past Medical History:  Diagnosis Date  . Anxiety 01/30/2014  . Back injury    lower disc  . CAD (coronary artery disease)   . CLL (chronic lymphocytic leukemia) ([place]) 03/18/2011  . Diabetes mellitus ([place])    Type 2   . ECRB (extensor carpi  radialis brevis) tenosynovitis   . GERD (gastroesophageal reflux disease)    takes Nexium if needed  . Hyperlipidemia   . Lateral epicondylitis of left elbow   . MI, acute, non ST segment elevation ([place]) 06/28/2009   with stenting of the LAD  . Neuromuscular disorder ([place])    peripheral neuropathy  . PVD (peripheral vascular disease) ([place])   . Tobacco abuse     PAST SURGICAL HISTORY: Past Surgical History:  Procedure Laterality Date  . ADENOIDECTOMY  1955  . CARDIAC CATHETERIZATION    . CARDIAC CATHETERIZATION N/A 09/26/2014   Procedure: Left Heart Cath and Coronary Angiography;  Surgeon: Peter M Martinique, MD;  Location: [facility] INVASIVE CV  LAB;  Service: Cardiovascular;  Laterality: N/A;  . carpel tunnel release Left 04-1989  . carpel tunnel release  Right 01-1989  . CHOLECYSTECTOMY  2007  . CORONARY STENT PLACEMENT  May 2011  . femoral stents    . IR CV LINE INJECTION  08/18/2017  . IR CV LINE INJECTION  09/01/2017  . IR CV LINE INJECTION  02/02/2018  . LATERAL EPICONDYLE RELEASE Left 02/12/2014   Procedure: LEFT ELBOW DEBRIDEMENT WITH TENDON REPAIR ;  Surgeon: Lorn Junes, MD;  Location: [facility];  Service: Orthopedics;  Laterality: Left;  . LEFT CAI STENT/PTA AND POPLITEAL ARTERY/TIBIAL THROMBECTOMY     . LEFT HEART CATHETERIZATION WITH CORONARY ANGIOGRAM N/A 08/26/2011   Procedure: LEFT HEART CATHETERIZATION WITH CORONARY ANGIOGRAM;  Surgeon: Peter M Martinique, MD;  Location: [facility] CATH LAB;  Service: Cardiovascular;  Laterality: N/A;  . PERIPHERAL VASCULAR CATHETERIZATION N/A 01/01/2015   Procedure: Abdominal Aortogram;  Surgeon: Conrad Pilgrim, MD;  Location: [facility] CV LAB;  Service: Cardiovascular;  Laterality: N/A;  . TARSAL TUNNEL RELEASE Bilateral 08-2007    FAMILY HISTORY: Family History  Problem Relation Age of Onset  . Lung cancer Mother 28  . Cancer Mother        lung  . Heart failure Father 60  . Heart disease Father     SOCIAL HISTORY: Social History    Socioeconomic History  . Marital status: Married    Spouse name: [name]  . Number of children: 1  . Years of education: College  . Highest education level: Not on file  Occupational History    Employer: [employer]    Comment: [employer]  Tobacco Use  . Smoking status: Former Smoker    Packs/day: 0.50    Years: 44.00    Pack years: 22.00    Types: Cigarettes    Quit date: 04/23/2016    Years since quitting: 2.7  . Smokeless tobacco: Former Systems developer  . Tobacco comment: but does have ocassional relapses  Substance and Sexual Activity  . Alcohol use: No    Alcohol/week: 0.0 standard drinks    Comment:  drinks non-alcoholic beer  . Drug use: No  . Sexual activity: Yes  Other Topics Concern  . Not on file  Social History Narrative   Patient lives at home with wife.   Caffeine Use: 15 cups of caffeine weekly   Social Determinants of Health   Financial Resource Strain: Unknown  . Difficulty of Paying Living Expenses: Patient refused  Food Insecurity: Unknown  . Worried About Charity fundraiser in the Last Year: Patient refused  . Ran Out of Food in the Last Year: Patient refused  Transportation Needs: Unknown  . Lack of Transportation (Medical): Patient refused  . Lack of Transportation (Non-Medical): Patient refused  Physical Activity: Unknown  . Days of Exercise per Week: Patient refused  . Minutes of Exercise per Session: Patient refused  Stress: Unknown  . Feeling of Stress : Patient refused  Social Connections: Unknown  . Frequency of Communication with Friends and Family: Patient refused  . Frequency of Social Gatherings with Friends and Family: Patient refused  . Attends Religious Services: Patient refused  . Active Member of Clubs or Organizations: Patient refused  . Attends Archivist Meetings: Patient refused  . Marital Status: Patient refused  Intimate Partner Violence: Not At Risk  . Fear of Current or Ex-Partner: No  . Emotionally  Abused: No  . Physically Abused: No  . Sexually Abused:  No   PHYSICAL EXAM  Vitals:   02/05/19 1038  BP: 101/64  Pulse: 71  Temp: (!) 97 F (36.1 C)  Weight: 189 lb 6.4 oz (85.9 kg)  Height: 6' (1.829 m)   Body mass index is 25.69 kg/m.  Generalized: Well developed, in no acute distress   Neurological examination  Mentation: Alert oriented to time, place, history taking. Follows all commands speech and language fluent Cranial nerve II-XII: Pupils were equal round reactive to light. Extraocular movements were full, visual field were full on confrontational test. Facial sensation and strength were normal.  Head turning and shoulder shrug  were normal and symmetric. Motor: The motor testing reveals 5 over 5 strength of all 4 extremities. Good symmetric motor tone is noted throughout.  Sensory: Sensory testing is intact to soft touch on all 4 extremities. No evidence of extinction is noted.  Decreased pinprick and vibration sensation up to ankle level bilaterally. Coordination: Cerebellar testing reveals good finger-nose-finger and heel-to-shin bilaterally.  Gait and station: Gait is normal. Would not perform tandem gait.  Romberg was unsteady. Reflexes: Deep tendon reflexes are symmetric and normal bilaterally.   DIAGNOSTIC DATA (LABS, IMAGING, TESTING) - I reviewed patient records, labs, notes, testing and imaging myself where available.  Lab Results  Component Value Date   WBC 9.5 01/18/2019   HGB 13.8 01/18/2019   HCT 40.0 01/18/2019   MCV 103.6 (H) 01/18/2019   PLT 52 (L) 01/18/2019      Component Value Date/Time   NA 140 01/18/2019 0917   NA 136 01/26/2017 0940   K 3.7 01/18/2019 0917   K 4.0 01/26/2017 0940   CL 105 01/18/2019 0917   CL 104 08/03/2012 1229   CO2 28 01/18/2019 0917   CO2 23 01/26/2017 0940   GLUCOSE 150 (H) 01/18/2019 0917   GLUCOSE 146 (H) 01/26/2017 0940   GLUCOSE 223 (H) 08/03/2012 1229   BUN 9 01/18/2019 0917   BUN 9.6 01/26/2017 0940    CREATININE 0.81 01/18/2019 0917   CREATININE 1.04 04/24/2018 0821   CREATININE 1.0 01/26/2017 0940   CALCIUM 8.5 (L) 01/18/2019 0917   CALCIUM 8.7 01/26/2017 0940   PROT 5.3 (L) 01/18/2019 0917   PROT 5.9 (L) 04/13/2017 0823   PROT 5.7 (L) 01/26/2017 0940   ALBUMIN 3.6 01/18/2019 0917   ALBUMIN 3.6 01/26/2017 0940   AST 9 (L) 01/18/2019 0917   AST 13 (L) 04/24/2018 0821   AST 13 01/26/2017 0940   ALT 11 01/18/2019 0917   ALT 10 04/24/2018 0821   ALT 12 01/26/2017 0940   ALKPHOS 99 01/18/2019 0917   ALKPHOS 78 01/26/2017 0940   BILITOT 0.8 01/18/2019 0917   BILITOT 0.7 04/24/2018 0821   BILITOT 0.64 01/26/2017 0940   GFRNONAA >60 01/18/2019 0917   GFRNONAA >60 04/24/2018 0821   GFRAA >60 01/18/2019 0917   GFRAA >60 04/24/2018 0821   Lab Results  Component Value Date   CHOL 77 (L) 12/12/2018   HDL 28 (L) 12/12/2018   LDLCALC 31 12/12/2018   LDLDIRECT 117.6 08/22/2011   TRIG 91 12/12/2018   CHOLHDL 2.8 12/12/2018   Lab Results  Component Value Date   HGBA1C 6.2 (H) 04/13/2017   Lab Results  Component Value Date   BJYNWGNF62 130 04/13/2017   Lab Results  Component Value Date   TSH 1.620 04/13/2017   ASSESSMENT AND PLAN 68 y.o. year old male  has a past medical history of Anxiety (01/30/2014), Back injury, CAD (coronary artery disease),  CLL (chronic lymphocytic leukemia) ([place]) (03/18/2011), Diabetes mellitus ([place]), ECRB (extensor carpi radialis brevis) tenosynovitis, GERD (gastroesophageal reflux disease), Hyperlipidemia, Lateral epicondylitis of left elbow, MI, acute, non ST segment elevation ([place]) (06/28/2009), Neuromuscular disorder ([place]), PVD (peripheral vascular disease) ([place]), and Tobacco abuse. here with:  1.  Peripheral neuropathy -This year he is on Medicare, was on commercial insurance prior to retirement, was taking Lyrica 200 mg 3 times a day, and 2 capsules at bedtime, now Medicare will not pay for this dose -He has only been taking Lyrica 200 mg daily, with  continued pain, but able to function and sleep at night -I will order Lyrica 200 mg 3 times a day, he will try this to see how controlled his pain is, we can try him order higher dose in the future and provide a letter to appeal the denial  -Continue nortriptyline 25 mg capsules, 4 at bedtime -Follow-up in 6 months or sooner if needed  I spent 15 minutes with the patient. 50% of this time was spent discussing his plan of care.  Butler Denmark, AGNP-C, DNP 02/05/2019, 11:08 AM [facility] [address], [place], [place] [address] [phone]

## 2019-02-06 ENCOUNTER — Other Ambulatory Visit: Payer: Self-pay | Admitting: Hematology and Oncology

## 2019-02-06 DIAGNOSIS — C9110 Chronic lymphocytic leukemia of B-cell type not having achieved remission: Secondary | ICD-10-CM

## 2019-02-11 ENCOUNTER — Telehealth: Payer: Self-pay

## 2019-02-11 NOTE — Telephone Encounter (Signed)
Pending approval for Pregabalin 200 mg Key: [id number] Rx #: [id number] PA Case ID: [id number]   I will update once a decision has been made.

## 2019-02-11 NOTE — Telephone Encounter (Signed)
Received a approval for Pregabalin 200 mg. Approved through 02-11-2020. Approval letter has been faxed to [facility] on file. Confirmation fax has been received.

## 2019-02-18 MED FILL — CALQUENCE 100 MG CAPSULE: 100 | 30 days supply | Qty: 60 | Fill #10

## 2019-02-22 ENCOUNTER — Institutional Professional Consult (permissible substitution): Payer: Medicare Other | Admitting: Internal Medicine

## 2019-02-27 ENCOUNTER — Encounter: Payer: Self-pay | Admitting: Internal Medicine

## 2019-02-27 ENCOUNTER — Other Ambulatory Visit: Payer: Self-pay

## 2019-02-27 ENCOUNTER — Ambulatory Visit (INDEPENDENT_AMBULATORY_CARE_PROVIDER_SITE_OTHER): Payer: Medicare Other | Admitting: Internal Medicine

## 2019-02-27 VITALS — BP 122/64 | HR 71 | Ht 72.0 in | Wt 194.4 lb

## 2019-02-27 DIAGNOSIS — F17200 Nicotine dependence, unspecified, uncomplicated: Secondary | ICD-10-CM

## 2019-02-27 DIAGNOSIS — J432 Centrilobular emphysema: Secondary | ICD-10-CM | POA: Diagnosis not present

## 2019-02-27 DIAGNOSIS — C9110 Chronic lymphocytic leukemia of B-cell type not having achieved remission: Secondary | ICD-10-CM | POA: Diagnosis not present

## 2019-02-27 MED ORDER — BUDESONIDE-FORMOTEROL FUMARATE 160-4.5 MCG/ACT IN AERO: 2.0000 | INHALATION_SPRAY | Freq: Two times a day (BID) | RESPIRATORY_TRACT | 5 refills | Status: DC

## 2019-02-27 NOTE — Progress Notes (Signed)
Ryan Copeland    [id number]    05/16/1950  Primary Care Physician:Kaplan, Baldemar Friday., PA-C  Referring Physician: [name]., PA-C [address]  [place] [address] Reason for Consultation: "COPD" Date of Consultation: 02/27/2019  Chief complaint:   Chief Complaint  Patient presents with  . Consult    Pt being referred due to cough that he has had for several months and will occ cough up white to green phlegm. Pt denies any complaints of SOB.      HPI: History of CAD, PVD and persistent CLL on therapy. Here for persistent productive cough. History of tobacco use disorder, last cigarette March 2018.   Had similar symptoms in 2017 with cough and productive sputum.  Last received levofloxacin and prednisone in Dec 2020 from oncology for exacerbation.  2 flares in 2020. Independent with ADLs.  Never been hospitalized for breathing difficulty or pneumonia.   In the past has been treated with IVIG, Bendamustine, Rituximab in the past for his CLL.   Cough is worst first thing in the morning. But also wakes him up at night.   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  Exposures: Lives at home with wife, pet dog.  Smoking history: March 2018. Quit using patches.   Social History   Occupational History    Employer: [employer]    Comment: [employer]  Tobacco Use  . Smoking status: Former Smoker    Packs/day: 1.00    Years: 44.00    Pack years: 44.00    Types: Cigarettes    Quit date: 04/23/2016    Years since quitting: 2.8  . Smokeless tobacco: Former Network engineer and Sexual Activity  . Alcohol use: No    Alcohol/week: 0.0 standard drinks    Comment:  drinks non-alcoholic beer  . Drug use: No  . Sexual activity: Yes    Relevant family history:  Family History  Problem Relation Age of Onset  . Lung cancer Mother 64  .  Cancer Mother        lung  . Heart failure Father 20  . Heart disease Father     Past Medical History:  Diagnosis Date  . Anxiety 01/30/2014  . Back injury    lower disc  . CAD (coronary artery disease)   . CLL (chronic lymphocytic leukemia) ([place]) 03/18/2011  . Diabetes mellitus ([place])    Type 2   . ECRB (extensor carpi radialis brevis) tenosynovitis   . GERD (gastroesophageal reflux disease)    takes Nexium if needed  . Hyperlipidemia   . Lateral epicondylitis of left elbow   . MI, acute, non ST segment elevation ([place]) 06/28/2009   with stenting of the LAD  . Neuromuscular disorder ([place])    peripheral neuropathy  . PVD (peripheral vascular disease) ([place])   . Tobacco abuse     Past Surgical History:  Procedure Laterality Date  . ADENOIDECTOMY  1955  . CARDIAC CATHETERIZATION    . CARDIAC CATHETERIZATION N/A 09/26/2014   Procedure: Left Heart Cath and Coronary Angiography;  Surgeon: Peter M Martinique, MD;  Location: [facility] CV LAB;  Service: Cardiovascular;  Laterality: N/A;  . carpel tunnel release Left 04-1989  . carpel tunnel release  Right 01-1989  . CHOLECYSTECTOMY  2007  . CORONARY STENT PLACEMENT  May 2011  . femoral stents    . IR  CV LINE INJECTION  08/18/2017  . IR CV LINE INJECTION  09/01/2017  . IR CV LINE INJECTION  02/02/2018  . LATERAL EPICONDYLE RELEASE Left 02/12/2014   Procedure: LEFT ELBOW DEBRIDEMENT WITH TENDON REPAIR ;  Surgeon: Lorn Junes, MD;  Location: [facility];  Service: Orthopedics;  Laterality: Left;  . LEFT CAI STENT/PTA AND POPLITEAL ARTERY/TIBIAL THROMBECTOMY     . LEFT HEART CATHETERIZATION WITH CORONARY ANGIOGRAM N/A 08/26/2011   Procedure: LEFT HEART CATHETERIZATION WITH CORONARY ANGIOGRAM;  Surgeon: Peter M Martinique, MD;  Location: [facility] CATH LAB;  Service: Cardiovascular;  Laterality: N/A;  . PERIPHERAL VASCULAR CATHETERIZATION N/A 01/01/2015   Procedure: Abdominal Aortogram;  Surgeon: Conrad [place], MD;  Location: [facility] CV LAB;  Service:  Cardiovascular;  Laterality: N/A;  . TARSAL TUNNEL RELEASE Bilateral 08-2007     Review of systems: Review of Systems  Constitutional: Negative for chills, fever and weight loss.  HENT: Positive for congestion. Negative for sinus pain and sore throat.   Eyes: Negative for discharge and redness.  Respiratory: Positive for cough and sputum production. Negative for hemoptysis, shortness of breath and wheezing.   Cardiovascular: Negative for chest pain, palpitations and leg swelling.  Gastrointestinal: Negative for heartburn, nausea and vomiting.  Musculoskeletal: Negative for joint pain and myalgias.  Skin: Negative for rash.  Neurological: Negative for dizziness, tremors, focal weakness and headaches.  Endo/Heme/Allergies: Negative for environmental allergies.  Psychiatric/Behavioral: Negative for depression. The patient is nervous/anxious.   All other systems reviewed and are negative.   Physical Exam: Blood pressure 122/64, pulse 71, height 6' (1.829 m), weight 194 lb 6.4 oz (88.2 kg), SpO2 100 %. Gen:      No acute distress Eyes: EOMI, sclera anicteric ENT:  no nasal polyps, mucus membranes moist Neck:     Supple, no thyromegaly Lungs:    No increased respiratory effort, symmetric chest wall excursion, clear to auscultation bilaterally, no wheezes or crackles CV:         Regular rate and rhythm; no murmurs, rubs, or gallops.  No pedal edema Abd:      + bowel sounds; soft, non-tender; no distension +splenomegaly.  MSK: no acute synovitis of DIP or PIP joints, no mechanics hands.  Skin:      Multiple ecchymoses on upper extremities.  Neuro: normal speech, no focal facial asymmetry Psych: alert and oriented x3, normal mood and affect  Data Reviewed: Imaging: I have personally reviewed the CT Chest from 2020 which demonstrates mild centrilobular emphysema with peribronchial thickening.   PFTs: Has had them before through employee health, but doesn't have access to these  records  Labs:  Immunization status: Immunization History  Administered Date(s) Administered  . Fluad Quad(high Dose 65+) 11/23/2018  . Influenza,inj,Quad PF,6+ Mos 11/13/2015, 11/03/2016  . Influenza-Unspecified 11/12/2013, 12/04/2014, 11/08/2017  . Pneumococcal Conjugate-13 01/02/2015  . Pneumococcal Polysaccharide-23 06/26/2013, 05/16/2017    Assessment:  Chronic CLL - on Acalabrutinib Centrilobular Emphysema - new diagnosis.  Tobacco Use Disorder  Plan/Recommendations: Will obtain spirometry. Will start symbicort with prn albuterol.  Discussed COPD disease management and progression as well as vaccinations for disease prevention - he inquired specifically about covid 19 vaccination and I do think he is highly indicated for it - encouraged him to discuss with his oncologist Dr. Alvy Bimler as well.    I spent 46 minutes on 02/27/2019 in care of this patient including face to face time and non-face to face time spent charting, review of outside records, and coordination of care.  [id number]  15-29 minutes or Straightforward MDM 50037 30-44 minutes or Low level MDM 04888 45-59 minutes or Moderate level MDM [id number] 60-74 minutes or High level MDM  Return to Care: Return in about 4 months (around 06/27/2019).  Lenice Llamas, MD Pulmonary and [facility]  CC: [name]., PA-C

## 2019-02-27 NOTE — Patient Instructions (Addendum)
The patient should have follow up scheduled with myself in 4 months.  Prior to next visit patient should have a PFTs including: Spirometry with bronchodilator if obstructed  We will start a medication called symbicort to see if it helps your cough. Make sure you gargle after you take this medication. You can take albuterol rescue inhaler as needed if you have a coughing fit, or feel short of breath or wheezing.   ------------------------------------------------------------------------------------------------------------------- Metered Dose Inhaler (MDI) Instructions (Albuterol, Symbicort)  Before using your inhaler for the first time: 1. Take the cap off the mouthpiece. 2. Shake the inhaler for 5 seconds 3. Press down on the canister to spray the medicine into the air. 4. Repeat these steps 3 more times. If you haven't used your inhaler in more than 2 weeks, repeat these steps before using it.  To use your inhaler: 1. Take the cap off the mouthpiece 2. Shake the inhaler for 5 seconds. 3. Hold it upright with your finger on the top of the canister and your thumb on the bottom of the inhaler. 4. Breathe out. 5. Close your lips around the mouthpiece. 6. As you start to inhale the next breath, press down on the canister. 7. Inhale deeply and slowly through your mouth. 8. Hold your breath for 5 to 10 seconds to keep the medicine in your lungs. 9. Let your breath out.   10. Repeat these steps if you are supposed to take 2 puffs.  11. Put the cap back on the mouthpiece 12. Remember to rinse, gargle and spit with water after use if your inhaler has a steroid in it (Advair, Symbicort, Dulera, Qvar, Flovent)  Caring for your MDI and chamber For most MDIs, remove the canister and rinse the plastic holder with warm running water once a week to prevent the holes from getting clogged. Shake well and let air dry. There are some medications in which the inhaler cannot be removed from the holder.  These usually need to be cleaned by wiping the mouthpiece with a cloth or cleaning with a dry cotton swab. Refer to the patient instructions that come with your inhaler. Clean the chamber about once a week. Remove the soft ring at the end of the chamber. Soak the spacer in warm water with a mild detergent. Carefully clean and, rinse, and shake off excess water. Do not hand dry. Allow to completely air dry. Do not store the chamber in a plastic bag.  Checking your MDI It is important that you know how much medication is left in your inhaler. The number of puffs contained in your MDI is printed on the side of the canister. After you have used that number of puffs, you must discard your inhaler even if it continues to spray. Keep track of how many puffs you have used. You also must include priming puffs in this total. If you use an MDI every day for control of symptoms, you can determine how long it will last by dividing the total number of puffs in the MDI by the total puffs you use every day. For example: 2 puffs x 2 times per day = 4 total puffs per day. At 120 puffs, the MDI will last 30 days. If you use an inhaler only when you need to, you must keep track of how many times you spray the inhaler. Some of the newer MDIs have counting devices built in.  If your MDI does not have a dose counter, you can obtain a device  that attaches to the MDI and counts down the number of puffs each time you press the inhaler. Ask your health care professional for more information about these devices, as well as how to best keep track of your medicine without an add-on device (if you prefer).     Understanding COPD   What is COPD? COPD stands for chronic obstructive pulmonary (lung) disease. COPD is a general term used for several lung diseases.  COPD is an umbrella term and encompasses other  common diseases in this group like chronic bronchitis and emphysema. Chronic asthma may also be included in this group. While  some patients with COPD have only chronic bronchitis or emphysema, most patients have a combination of both.  You might hear these terms used in exchange for one another.   COPD adds to the work of the heart. Diseased lungs may reduce the amount of oxygen that goes to the blood. High blood pressure in blood vessels from the heart to the lungs makes it difficult for the heart to pump. Lung disease can also cause the body to produce too many red blood cells which may make the blood thicker and harder to pump.   Patients who have COPD with low oxygen levels may develop an enlarged heart (cor pulmonale). This condition weakens the heart and causes increased shortness of breath and swelling in the legs and feet.   Chronic bronchitis Chronic bronchitis is irritation and inflammation (swelling) of the lining in the bronchial tubes (air passages). The irritation causes coughing and an excess amount of mucus in the airways. The swelling makes it difficult to get air in and out of the lungs. The small, hair-like structures on the inside of the airways (called cilia) may be damaged by the irritation. The cilia are then unable to help clean mucus from the airways.  Bronchitis is generally considered to be chronic when you have: a productive cough (cough up mucus) and shortness of breath that lasts about 3 months or more each year for 2 or more years in a row. Your doctor may define chronic bronchitis differently.   Emphysema Emphysema is the destruction, or breakdown, of the walls of the alveoli (air sacs) located at the end of the bronchial tubes. The damaged alveoli are not able to exchange oxygen and carbon dioxide between the lungs and the blood. The bronchioles lose their elasticity and collapse when you exhale, trapping air in the lungs. The trapped air keeps fresh air and oxygen from entering the lungs.   Who is affected by COPD? Emphysema and chronic bronchitis affect approximately 16 million people in the  [place], or close to 11 percent of the population.   Symptoms of COPD   Shortness of breath   Shortness of breath with mild exercise (walking, using the stairs, etc.)   Chronic, productive cough (with mucus)   A feeling of "tightness" in the chest   Wheezing   What causes COPD? The two primary causes of COPD are cigarette smoking and alpha1-antitrypsin (AAT) deficiency. Air pollution and occupational dusts may also contribute to COPD, especially when the person exposed to these substances is a cigarette smoker.  Cigarette smoke causes COPD by irritating the airways and creating inflammation that narrows the airways, making it more difficult to breathe. Cigarette smoke also causes the cilia to stop working properly so mucus and trapped particles are not cleaned from the airways. As a result, chronic cough and excess mucus production develop, leading to chronic bronchitis.  In  some people, chronic bronchitis and infections can lead to destruction of the small airways, or emphysema.  AAT deficiency, an inherited disorder, can also lead to emphysema. Alpha antitrypsin (AAT) is a protective material produced in the liver and transported to the lungs to help combat inflammation. When there is not enough of the chemical AAT, the body is no longer protected from an enzyme in the white blood cells.   How is COPD diagnosed?  To diagnose COPD, the physician needs to know: . Do you smoke?  . Have you had chronic exposure to dust or air pollutants?  . Do other members of your family have lung disease?  Marland Kitchen Are you short of breath?  . Do you get short of breath with exercise?  Marland Kitchen Do you have chronic cough and/or wheezing?  Marland Kitchen Do you cough up excess mucus?  To help with the diagnosis, the physician will conduct a thorough physical exam which includes:  1. Listening to your lungs and heart  2. Checking your blood pressure and pulse  3. Examining your nose and throat  4. Checking your feet and  ankles for swelling   Laboratory and other tests Several laboratory and other tests are needed to confirm a diagnosis of COPD. These tests may include:  . Chest X-ray to look for lung changes that could be caused by COPD  .  Spirometry and pulmonary function tests (PFTs) to determine lung volume and air flow  . Pulse oximetry to measure the saturation of oxygen in the blood  . Arterial blood gases (ABGs) to determine the amount of oxygen and carbon dioxide in the blood  . Exercise testing to determine if the oxygen level in the blood drops during exercise   Treatment In the beginning stages of COPD, there is minimal shortness of breath that may be noticed only during exercise. As the disease progresses, shortness of breath may worsen and you may need to wear an oxygen device.   To help control other symptoms of COPD, the following treatments and lifestyle changes may be prescribed.  . Quitting smoking  . Avoiding cigarette smoke and other irritants  . Taking medications including: a. bronchodilators b. anti-inflammatory agents c. oxygen d. antibiotics  . Maintaining a healthy diet  . Following a structured exercise program such as pulmonary rehabilitation . Preventing respiratory infections  . Controlling stress   If your COPD progresses, you may be eligible to be evaluated for lung volume reduction surgery or lung transplantation. You may also be eligible to participate in certain clinical trials (research studies). Ask your health care providers about studies being conducted in your hospital.   What is the outlook? Although COPD can not be cured, its symptoms can be treated and your quality of life can be improved. Your prognosis or outlook for the future will depend on how well your lungs are functioning, your symptoms, and how well you respond to and follow your treatment plan.

## 2019-03-07 ENCOUNTER — Encounter: Payer: Self-pay | Admitting: *Deleted

## 2019-03-14 ENCOUNTER — Telehealth: Payer: Self-pay

## 2019-03-14 MED FILL — CALQUENCE 100 MG CAPSULE: 100 | 30 days supply | Qty: 60 | Fill #11

## 2019-03-14 NOTE — Telephone Encounter (Signed)
Oral Oncology Patient Advocate Encounter  Was successful in securing patient a $8000 grant from [facility] to provide copayment coverage for [place].  This will keep the out of pocket expense at $0.     [employer] ID: [id number]  I have spoken with the patient.   The billing information is as follows and has been shared with [name]: [id number] PCN: [id number] Member ID: [id number] Group ID: [id number] Dates of Eligibility: 03/12/19 through 03/10/20  [place] Patient Ryan Copeland Phone [phone] Fax [phone] 03/14/2019 9:21 AM

## 2019-03-15 ENCOUNTER — Other Ambulatory Visit: Payer: Self-pay

## 2019-03-15 ENCOUNTER — Inpatient Hospital Stay: Payer: Medicare Other | Attending: Hematology and Oncology | Admitting: Hematology and Oncology

## 2019-03-15 ENCOUNTER — Encounter: Payer: Self-pay | Admitting: Hematology and Oncology

## 2019-03-15 ENCOUNTER — Inpatient Hospital Stay: Payer: Medicare Other

## 2019-03-15 DIAGNOSIS — Z79899 Other long term (current) drug therapy: Secondary | ICD-10-CM | POA: Diagnosis not present

## 2019-03-15 DIAGNOSIS — C8308 Small cell B-cell lymphoma, lymph nodes of multiple sites: Secondary | ICD-10-CM | POA: Diagnosis not present

## 2019-03-15 DIAGNOSIS — Z85828 Personal history of other malignant neoplasm of skin: Secondary | ICD-10-CM | POA: Insufficient documentation

## 2019-03-15 DIAGNOSIS — D61818 Other pancytopenia: Secondary | ICD-10-CM | POA: Diagnosis not present

## 2019-03-15 DIAGNOSIS — C9110 Chronic lymphocytic leukemia of B-cell type not having achieved remission: Secondary | ICD-10-CM | POA: Insufficient documentation

## 2019-03-15 DIAGNOSIS — R161 Splenomegaly, not elsewhere classified: Secondary | ICD-10-CM | POA: Insufficient documentation

## 2019-03-15 DIAGNOSIS — Z95828 Presence of other vascular implants and grafts: Secondary | ICD-10-CM

## 2019-03-15 DIAGNOSIS — J449 Chronic obstructive pulmonary disease, unspecified: Secondary | ICD-10-CM | POA: Insufficient documentation

## 2019-03-15 DIAGNOSIS — D801 Nonfamilial hypogammaglobulinemia: Secondary | ICD-10-CM

## 2019-03-15 LAB — COMPREHENSIVE METABOLIC PANEL
ALT: 8 U/L (ref 0–44)
AST: 11 U/L — ABNORMAL LOW (ref 15–41)
Albumin: 3.7 g/dL (ref 3.5–5.0)
Alkaline Phosphatase: 90 U/L (ref 38–126)
Anion gap: 6 (ref 5–15)
BUN: 13 mg/dL (ref 8–23)
CO2: 27 mmol/L (ref 22–32)
Calcium: 8.6 mg/dL — ABNORMAL LOW (ref 8.9–10.3)
Chloride: 108 mmol/L (ref 98–111)
Creatinine, Ser: 0.8 mg/dL (ref 0.61–1.24)
GFR calc Af Amer: >60 mL/min (ref >60)
GFR calc non Af Amer: >60 mL/min (ref >60)
Glucose, Bld: 117 mg/dL — ABNORMAL HIGH (ref 70–99)
Potassium: 3.9 mmol/L (ref 3.5–5.1)
Sodium: 141 mmol/L (ref 135–145)
Total Bilirubin: 0.6 mg/dL (ref 0.3–1.2)
Total Protein: 5.4 g/dL — ABNORMAL LOW (ref 6.5–8.1)

## 2019-03-15 LAB — CBC WITH DIFFERENTIAL/PLATELET
Abs Immature Granulocytes: 0.24 10*3/uL — ABNORMAL HIGH (ref 0.00–0.07)
Basophils Absolute: 0 10*3/uL (ref 0.0–0.1)
Basophils Relative: 0 %
Eosinophils Absolute: 0 10*3/uL (ref 0.0–0.5)
Eosinophils Relative: 1 %
HCT: 36.9 % — ABNORMAL LOW (ref 39.0–52.0)
Hemoglobin: 12.5 g/dL — ABNORMAL LOW (ref 13.0–17.0)
Immature Granulocytes: 5 %
Lymphocytes Relative: 26 %
Lymphs Abs: 1.4 10*3/uL (ref 0.7–4.0)
MCH: 36.7 pg — ABNORMAL HIGH (ref 26.0–34.0)
MCHC: 33.9 g/dL (ref 30.0–36.0)
MCV: 108.2 fL — ABNORMAL HIGH (ref 80.0–100.0)
Monocytes Absolute: 0.2 10*3/uL (ref 0.1–1.0)
Monocytes Relative: 4 %
Neutro Abs: 3.4 10*3/uL (ref 1.7–7.7)
Neutrophils Relative %: 64 %
Platelets: 49 10*3/uL — ABNORMAL LOW (ref 150–400)
RBC: 3.41 MIL/uL — ABNORMAL LOW (ref 4.22–5.81)
RDW: 17.5 % — ABNORMAL HIGH (ref 11.5–15.5)
WBC: 5.3 10*3/uL (ref 4.0–10.5)
nRBC: 0 % (ref 0.0–0.2)

## 2019-03-15 MED ORDER — SODIUM CHLORIDE 0.9% FLUSH
10.0000 mL | Freq: Once | INTRAVENOUS | Status: AC
  Administered 2019-03-15: 10 mL
  Filled 2019-03-15: qty 10

## 2019-03-15 MED ORDER — HEPARIN SOD (PORK) LOCK FLUSH 100 UNIT/ML IV SOLN
500.0000 [IU] | Freq: Once | INTRAVENOUS | Status: AC
  Administered 2019-03-15: 500 [IU]
  Filled 2019-03-15: qty 5

## 2019-03-15 NOTE — Patient Instructions (Signed)

## 2019-03-15 NOTE — Assessment & Plan Note (Signed)
His last imaging studies showed excellent response to therapy He is not in complete remission but overall no evidence of disease progression I will continue port flush every other month and I will see him again in  March for further follow-up, with plan imaging study around April or May He will continue treatment indefinitely

## 2019-03-15 NOTE — Assessment & Plan Note (Signed)
This was recently adequately treated by his dermatologist He will continue aggressive skin protection and follow-up with dermatologist

## 2019-03-15 NOTE — Assessment & Plan Note (Signed)
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

## 2019-03-15 NOTE — Assessment & Plan Note (Signed)
He was recently evaluated at pulmonologist clinic His exam today is benign He will continue inhaler as prescribed

## 2019-03-15 NOTE — Progress Notes (Signed)
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 [facility]) His last imaging studies showed excellent response to therapy He is not in complete remission but overall no evidence of disease progression I will continue port flush every other month and I will see him again in  March for further follow-up, with plan imaging study around April or May He will continue treatment indefinitely  Pancytopenia, acquired [facility]) His mild intermittent pancytopenia is due to his splenomegaly and treatment He is not symptomatic Observe for now  COPD mixed type [facility]) He was recently evaluated at pulmonologist clinic His exam today is benign He will continue inhaler as prescribed  Hx of nonmelanoma skin cancer This was recently adequately treated by his dermatologist He will continue aggressive skin protection and follow-up with dermatologist   No orders of the defined types were placed in this encounter.   All questions were answered. The patient knows to call the clinic with any problems, questions or concerns. The total time spent in the appointment was 20 minutes encounter with patients including review of chart and various tests results, discussions about plan of care and coordination of care plan   [name], MD 03/15/2019 11:10 AM  INTERVAL HISTORY: Please see below for problem oriented charting. He returns for further follow-up He is doing well on treatment He has no side effects except for bruises He has questions regarding Covid vaccination He was recently evaluated by pulmonologist and was prescribed Symbicort He was also treated for skin cancer by his dermatologist Otherwise, he denies recent infection, fever or  chills The patient denies any recent signs or symptoms of bleeding such as spontaneous epistaxis, hematuria or hematochezia.  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  No evidence of aortic dissection or other acute process in the chest. 2.  Centrilobular emphysema with a 5 mm right lung nodule. Given the concurrent centrilobular emphysema, follow-up chest CT at 6 -12 months is recommended.  3.  Coronary artery atherosclerosis which is age advanced. 4.  Prominent thoracic lymph nodes.  These can be reevaluated at follow-up.   05/06/2010 Imaging   1.  Multiple small periaortic lymph nodes consistent with the patient's history of the chronic lymphocytic leukemia. 2.  No evidence of solid organ involvement   11/03/2011 Imaging   1.  Interval progression of abdominal and pelvic adenopathy. 2.  Progression of splenomegaly.  The spleen now measures 23 cm in length   11/18/2011 Bone Marrow Biopsy   Bone Marrow, Aspirate,Biopsy, and Clot, right iliac bone - HYPERCELLULAR BONE MARROW WITH EXTENSIVE INVOLVEMENT BY CHRONIC LYMPHOCYTIC LEUKEMIA. PERIPHERAL BLOOD: - CHRONIC LYMPHOCYTIC LEUKEMIA   03/08/2012 Imaging   1.  Progressive increase in retroperitoneal, iliac, and inguinal lymphadenopathy. 2.  Interval increase in massive splenomegaly.    06/01/2012 Procedure   Placement of single lumen port a cath via right internal jugular vein.  The catheter tip lies at the cavoatrial junction.  A power injectable port a cath was placed and is ready for immediate use   06/20/2012 - 11/23/2012 Chemotherapy   He received FCR x  6 cycles   12/19/2012 Imaging   Left common iliac stent. Abdominal vasculature remains patent. Improving supraclavicular and axillary lymphadenopathy. Residual right subpectoral nodes measure up to 10 mm  short axis. Improving retroperitoneal lymphadenopathy, measuring up to 16 mm short axis. Improving splenomegaly, measuring 18.7 cm.    01/20/2016 Imaging   1. Stable exam.  No new or progressive findings. 2. No CT findings to explain odynophagia   09/02/2016 Pathology Results   The findings are consistent with involvement by previously known chronic lymphocytic leukemia   09/02/2016 Pathology Results   FISH for CLL came back positive for deletion 13q   09/15/2016 Imaging   1. Borderline enlarged abdominal and pelvic lymph nodes. Compared with 11/03/2011 these are decreased in size as detailed above. 2. Persistent splenomegaly. 3. Aortic Atherosclerosis (ICD10-I70.0). LAD coronary artery calcification noted.   10/06/2016 - 02/24/2017 Chemotherapy   He received Bendamustine and Rituxan   11/03/2016 Adverse Reaction   Dose of Bendamustine is reduced due to severe pancytopenia   12/28/2016 Imaging   1. Borderline enlarged abdominal peritoneal ligament and abdominal retroperitoneal lymph nodes, stable. 2. Splenomegaly. 3.  Aortic atherosclerosis (ICD10-170.0).   03/22/2017 PET scan   1. No hypermetabolic adenopathy identified within the neck, chest, abdomen or pelvis. 2. Prominent left retroperitoneal node measures 1.6 cm without significant FDG uptake. 3. Splenomegaly. 4. Aortic Atherosclerosis (ICD10-I70.0) and Emphysema (ICD10-J43.9). LAD and left circumflex atherosclerotic calcifications noted.   02/02/2018 Procedure   IMPRESSION: Widely patent right IJ power port catheter.   04/06/2018 Imaging   1. Interval enlargement of axillary, mediastinal, and retroperitoneal lymph nodes, as well as increased splenomegaly, findings concerning for progression of CLL in comparison to prior PET-CT dated 03/22/2017.  2.  Other chronic and incidental findings as detailed above.    04/16/2018 -  Chemotherapy   The patient had acalabrutinib for chemo   04/17/2018 - 04/21/2018 Hospital Admission   He  was hospitalized for influenza   11/29/2018 Imaging   1. Interval response to therapy. No thoracic added not scratch set no thoracic or pelvic adenopathy identified. Significant improvement in abdominal adenopathy. Largest remaining lymph node measures 1.3cm in the left retroperitoneal region. Previously 2.5 cm. 2. Persistent splenomegaly, improved from previous exam. 3. No new or progressive disease identified within the chest, abdomen or pelvis. 4. Stable appearance of 5 mm right middle lobe lung nodule. 5. Aortic Atherosclerosis (ICD10-I70.0) and Emphysema (ICD10-J43.9). Coronary artery calcifications.       REVIEW OF SYSTEMS:   Constitutional: Denies fevers, chills or abnormal weight loss Eyes: Denies blurriness of vision Ears, nose, mouth, throat, and face: Denies mucositis or sore throat Respiratory: Denies cough, dyspnea or wheezes Cardiovascular: Denies palpitation, chest discomfort or lower extremity swelling Gastrointestinal:  Denies nausea, heartburn or change in bowel habits Lymphatics: Denies new lymphadenopathy or easy bruising Neurological:Denies numbness, tingling or new weaknesses Behavioral/Psych: Mood is stable, no new changes  All other systems were reviewed with the patient and are negative.  I have reviewed the past medical history, past surgical history, social history and family history with the patient and they are unchanged from previous note.  ALLERGIES:  is allergic to codeine.  MEDICATIONS:  Current Outpatient Medications  Medication Sig Dispense Refill  . Acalabrutinib 100 MG CAPS Take 100 mg by mouth 2 (two) times daily.    Marland Kitchen acyclovir (ZOVIRAX) 400 MG tablet TAKE 1 TABLET BY MOUTH TWICE A DAY 180 tablet 1  . albuterol (PROVENTIL HFA) 108 (90 Base) MCG/ACT inhaler TAKE 2 PUFFS  BY MOUTH EVERY 6 HOURS AS NEEDED FOR WHEEZE OR SHORTNESS OF BREATH 18 g 11  . allopurinol (ZYLOPRIM) 300 MG tablet TAKE 1 TABLET BY MOUTH EVERY DAY 90 tablet 1  . ALPRAZolam  (XANAX) 0.25 MG tablet Take 0.25 mg by mouth as needed for anxiety.    Marland Kitchen b complex vitamins tablet Take 1 tablet by mouth daily.     . benzonatate (TESSALON) 100 MG capsule Take 2 capsules (200 mg total) by mouth 2 (two) times daily as needed for cough. 20 capsule 0  . budesonide-formoterol (SYMBICORT) 160-4.5 MCG/ACT inhaler Inhale 2 puffs into the lungs 2 (two) times daily. 1 Inhaler 5  . Cholecalciferol (VITAMIN D-1000 MAX ST) 1000 units tablet Take 1,000 Units by mouth daily.     . famotidine (PEPCID) 20 MG tablet TAKE 1 TABLET BY MOUTH TWICE A DAY 180 tablet 3  . fluticasone (FLONASE) 50 MCG/ACT nasal spray Place 2 sprays into the nose daily.     . furosemide (LASIX) 20 MG tablet TAKE 1 TABLET BY MOUTH EVERY DAY AS NEEDED 90 tablet 2  . HYDROcodone-acetaminophen (NORCO) 10-325 MG tablet Take 1 tablet by mouth See admin instructions. Every 6 to 8 hours as needed for pain  0  . lidocaine-prilocaine (EMLA) cream APPLY TO AFFECTED AREA ONCE 30 g 3  . loperamide (IMODIUM) 2 MG capsule TAKE 2 CAPSULES BY MOUTH AS NEEDED DIARRHEA OR LOOSE STOOLS 60 capsule 2  . meclizine (ANTIVERT) 12.5 MG tablet Take 12.5 mg by mouth every 6 (six) hours as needed for dizziness.   0  . metFORMIN (GLUCOPHAGE-XR) 500 MG 24 hr tablet Take 500 mg by mouth See admin instructions. Takes one tablet in AM and 2 tablets at night.    . nitroGLYCERIN (NITROSTAT) 0.4 MG SL tablet PLACE 1 TABLET UNDER THE TONGUE EVERY 5 MINUTES X 3 DOSES AS NEEDED FOR CHEST PAIN *MAX 3 DOSES* 25 tablet 3  . nortriptyline (PAMELOR) 25 MG capsule TAKE 4 CAPSULES BY MOUTH AT BEDTIME. NEED TO SEE DOCTOR FOR REFILLS (CALL [phone]) 360 capsule 1  . pregabalin (LYRICA) 200 MG capsule Take 1 capsule (200 mg total) by mouth 3 (three) times daily. 270 capsule 1  . rosuvastatin (CRESTOR) 5 MG tablet Take 1 tablet (5 mg total) by mouth daily. 90 tablet 3  . sildenafil (VIAGRA) 100 MG tablet Take 0.5-1 tablets (50-100 mg total) by mouth as needed for  erectile dysfunction. 10 tablet 3  . zolpidem (AMBIEN) 10 MG tablet Take 10 mg by mouth at bedtime as needed for sleep.     No current facility-administered medications for this visit.   Facility-Administered Medications Ordered in Other Visits  Medication Dose Route Frequency Provider Last Rate Last Admin  . 0.9 %  sodium chloride infusion   Intravenous Continuous [name], MD 50 mL/hr at 03/07/14 1005 New Bag at 03/07/14 1005  . sodium chloride 0.9 % injection 10 mL  10 mL Intracatheter PRN Marcy Panning, MD   10 mL at 08/22/12 1721    PHYSICAL EXAMINATION: ECOG PERFORMANCE STATUS: 1 - Symptomatic but completely ambulatory  Vitals:   03/15/19 1031  BP: (!) 114/57  Pulse: 73  Resp: 18  Temp: 98.9 F (37.2 C)  SpO2: 100%   Filed Weights   03/15/19 1031  Weight: 193 lb 3.2 oz (87.6 kg)    GENERAL:alert, no distress and comfortable SKIN: Noted extensive skin bruises EYES: normal, Conjunctiva are pink and non-injected, sclera clear OROPHARYNX:no exudate, no erythema and lips, buccal  mucosa, and tongue normal  NECK: supple, thyroid normal size, non-tender, without nodularity LYMPH:  no palpable lymphadenopathy in the cervical, axillary or inguinal LUNGS: clear to auscultation and percussion with normal breathing effort HEART: regular rate & rhythm and no murmurs and no lower extremity edema ABDOMEN:abdomen soft, non-tender and normal bowel sounds Musculoskeletal:no cyanosis of digits and no clubbing  NEURO: alert & oriented x 3 with fluent speech, no focal motor/sensory deficits  LABORATORY DATA:  I have reviewed the data as listed    Component Value Date/Time   NA 141 03/15/2019 0944   NA 136 01/26/2017 0940   K 3.9 03/15/2019 0944   K 4.0 01/26/2017 0940   CL 108 03/15/2019 0944   CL 104 08/03/2012 1229   CO2 27 03/15/2019 0944   CO2 23 01/26/2017 0940   GLUCOSE 117 (H) 03/15/2019 0944   GLUCOSE 146 (H) 01/26/2017 0940   GLUCOSE 223 (H) 08/03/2012 1229   BUN 13  03/15/2019 0944   BUN 9.6 01/26/2017 0940   CREATININE 0.80 03/15/2019 0944   CREATININE 1.04 04/24/2018 0821   CREATININE 1.0 01/26/2017 0940   CALCIUM 8.6 (L) 03/15/2019 0944   CALCIUM 8.7 01/26/2017 0940   PROT 5.4 (L) 03/15/2019 0944   PROT 5.9 (L) 04/13/2017 0823   PROT 5.7 (L) 01/26/2017 0940   ALBUMIN 3.7 03/15/2019 0944   ALBUMIN 3.6 01/26/2017 0940   AST 11 (L) 03/15/2019 0944   AST 13 (L) 04/24/2018 0821   AST 13 01/26/2017 0940   ALT 8 03/15/2019 0944   ALT 10 04/24/2018 0821   ALT 12 01/26/2017 0940   ALKPHOS 90 03/15/2019 0944   ALKPHOS 78 01/26/2017 0940   BILITOT 0.6 03/15/2019 0944   BILITOT 0.7 04/24/2018 0821   BILITOT 0.64 01/26/2017 0940   GFRNONAA >60 03/15/2019 0944   GFRNONAA >60 04/24/2018 0821   GFRAA >60 03/15/2019 0944   GFRAA >60 04/24/2018 0821    No results found for: SPEP, UPEP  Lab Results  Component Value Date   WBC 5.3 03/15/2019   NEUTROABS 3.4 03/15/2019   HGB 12.5 (L) 03/15/2019   HCT 36.9 (L) 03/15/2019   MCV 108.2 (H) 03/15/2019   PLT 49 (L) 03/15/2019      Chemistry      Component Value Date/Time   NA 141 03/15/2019 0944   NA 136 01/26/2017 0940   K 3.9 03/15/2019 0944   K 4.0 01/26/2017 0940   CL 108 03/15/2019 0944   CL 104 08/03/2012 1229   CO2 27 03/15/2019 0944   CO2 23 01/26/2017 0940   BUN 13 03/15/2019 0944   BUN 9.6 01/26/2017 0940   CREATININE 0.80 03/15/2019 0944   CREATININE 1.04 04/24/2018 0821   CREATININE 1.0 01/26/2017 0940      Component Value Date/Time   CALCIUM 8.6 (L) 03/15/2019 0944   CALCIUM 8.7 01/26/2017 0940   ALKPHOS 90 03/15/2019 0944   ALKPHOS 78 01/26/2017 0940   AST 11 (L) 03/15/2019 0944   AST 13 (L) 04/24/2018 0821   AST 13 01/26/2017 0940   ALT 8 03/15/2019 0944   ALT 10 04/24/2018 0821   ALT 12 01/26/2017 0940   BILITOT 0.6 03/15/2019 0944   BILITOT 0.7 04/24/2018 0821   BILITOT 0.64 01/26/2017 0940

## 2019-03-18 ENCOUNTER — Other Ambulatory Visit: Payer: Self-pay | Admitting: Hematology and Oncology

## 2019-03-18 DIAGNOSIS — C9110 Chronic lymphocytic leukemia of B-cell type not having achieved remission: Secondary | ICD-10-CM

## 2019-03-22 ENCOUNTER — Telehealth: Payer: Self-pay | Admitting: Hematology and Oncology

## 2019-03-22 NOTE — Telephone Encounter (Signed)
I talk with patient regarding 3/25

## 2019-04-10 ENCOUNTER — Other Ambulatory Visit: Payer: Self-pay | Admitting: Hematology and Oncology

## 2019-04-11 ENCOUNTER — Other Ambulatory Visit: Payer: Self-pay | Admitting: Hematology and Oncology

## 2019-04-11 MED FILL — CALQUENCE 100 MG CAPSULE: 100 | 30 days supply | Qty: 60 | Fill #0

## 2019-04-23 ENCOUNTER — Other Ambulatory Visit: Payer: Self-pay | Admitting: Nurse Practitioner

## 2019-05-09 ENCOUNTER — Inpatient Hospital Stay: Payer: Medicare Other | Attending: Hematology and Oncology

## 2019-05-09 ENCOUNTER — Telehealth: Payer: Self-pay | Admitting: Hematology and Oncology

## 2019-05-09 ENCOUNTER — Inpatient Hospital Stay: Payer: Medicare Other

## 2019-05-09 ENCOUNTER — Inpatient Hospital Stay (HOSPITAL_BASED_OUTPATIENT_CLINIC_OR_DEPARTMENT_OTHER): Payer: Medicare Other | Admitting: Hematology and Oncology

## 2019-05-09 ENCOUNTER — Encounter: Payer: Self-pay | Admitting: Hematology and Oncology

## 2019-05-09 ENCOUNTER — Other Ambulatory Visit: Payer: Self-pay

## 2019-05-09 DIAGNOSIS — Z79899 Other long term (current) drug therapy: Secondary | ICD-10-CM | POA: Insufficient documentation

## 2019-05-09 DIAGNOSIS — D696 Thrombocytopenia, unspecified: Secondary | ICD-10-CM | POA: Insufficient documentation

## 2019-05-09 DIAGNOSIS — M2550 Pain in unspecified joint: Secondary | ICD-10-CM | POA: Diagnosis not present

## 2019-05-09 DIAGNOSIS — C9110 Chronic lymphocytic leukemia of B-cell type not having achieved remission: Secondary | ICD-10-CM | POA: Diagnosis present

## 2019-05-09 DIAGNOSIS — C8308 Small cell B-cell lymphoma, lymph nodes of multiple sites: Secondary | ICD-10-CM | POA: Diagnosis not present

## 2019-05-09 DIAGNOSIS — J449 Chronic obstructive pulmonary disease, unspecified: Secondary | ICD-10-CM | POA: Diagnosis not present

## 2019-05-09 DIAGNOSIS — R161 Splenomegaly, not elsewhere classified: Secondary | ICD-10-CM | POA: Insufficient documentation

## 2019-05-09 DIAGNOSIS — Z95828 Presence of other vascular implants and grafts: Secondary | ICD-10-CM

## 2019-05-09 DIAGNOSIS — R252 Cramp and spasm: Secondary | ICD-10-CM | POA: Diagnosis not present

## 2019-05-09 DIAGNOSIS — I700 Atherosclerosis of aorta: Secondary | ICD-10-CM | POA: Insufficient documentation

## 2019-05-09 DIAGNOSIS — D801 Nonfamilial hypogammaglobulinemia: Secondary | ICD-10-CM

## 2019-05-09 LAB — CBC WITH DIFFERENTIAL/PLATELET
Abs Immature Granulocytes: 0.33 10*3/uL — ABNORMAL HIGH (ref 0.00–0.07)
Basophils Absolute: 0 10*3/uL (ref 0.0–0.1)
Basophils Relative: 0 %
Eosinophils Absolute: 0.1 10*3/uL (ref 0.0–0.5)
Eosinophils Relative: 1 %
HCT: 46.1 % (ref 39.0–52.0)
Hemoglobin: 15.4 g/dL (ref 13.0–17.0)
Immature Granulocytes: 5 %
Lymphocytes Relative: 22 %
Lymphs Abs: 1.5 10*3/uL (ref 0.7–4.0)
MCH: 34.1 pg — ABNORMAL HIGH (ref 26.0–34.0)
MCHC: 33.4 g/dL (ref 30.0–36.0)
MCV: 102.2 fL — ABNORMAL HIGH (ref 80.0–100.0)
Monocytes Absolute: 0.3 10*3/uL (ref 0.1–1.0)
Monocytes Relative: 5 %
Neutro Abs: 4.6 10*3/uL (ref 1.7–7.7)
Neutrophils Relative %: 67 %
Platelets: 64 10*3/uL — ABNORMAL LOW (ref 150–400)
RBC: 4.51 MIL/uL (ref 4.22–5.81)
RDW: 14.7 % (ref 11.5–15.5)
WBC: 6.8 10*3/uL (ref 4.0–10.5)
nRBC: 0 % (ref 0.0–0.2)

## 2019-05-09 LAB — COMPREHENSIVE METABOLIC PANEL
ALT: 29 U/L (ref 0–44)
AST: 27 U/L (ref 15–41)
Albumin: 4 g/dL (ref 3.5–5.0)
Alkaline Phosphatase: 100 U/L (ref 38–126)
Anion gap: 9 (ref 5–15)
BUN: 12 mg/dL (ref 8–23)
CO2: 27 mmol/L (ref 22–32)
Calcium: 9.2 mg/dL (ref 8.9–10.3)
Chloride: 105 mmol/L (ref 98–111)
Creatinine, Ser: 0.86 mg/dL (ref 0.61–1.24)
GFR calc Af Amer: >60 mL/min (ref >60)
GFR calc non Af Amer: >60 mL/min (ref >60)
Glucose, Bld: 96 mg/dL (ref 70–99)
Potassium: 4.4 mmol/L (ref 3.5–5.1)
Sodium: 141 mmol/L (ref 135–145)
Total Bilirubin: 0.6 mg/dL (ref 0.3–1.2)
Total Protein: 6.1 g/dL — ABNORMAL LOW (ref 6.5–8.1)

## 2019-05-09 MED ORDER — SODIUM CHLORIDE 0.9% FLUSH
10.0000 mL | Freq: Once | INTRAVENOUS | Status: AC
  Administered 2019-05-09: 10 mL
  Filled 2019-05-09: qty 10

## 2019-05-09 MED ORDER — HEPARIN SOD (PORK) LOCK FLUSH 100 UNIT/ML IV SOLN
250.0000 [IU] | Freq: Once | INTRAVENOUS | Status: AC
  Administered 2019-05-09: 500 [IU]
  Filled 2019-05-09: qty 5

## 2019-05-09 MED FILL — CALQUENCE 100 MG CAPSULE: 100 | 30 days supply | Qty: 60 | Fill #1

## 2019-05-09 NOTE — Assessment & Plan Note (Addendum)
His last imaging studies showed excellent response to therapy He is not in complete remission but overall no evidence of disease progression I will continue port flush every other month and I will see him again in about 8 weeks  Due to excellent control of his disease, I do not plan to repeat imaging study again until a few more months he will continue treatment indefinitely  He can proceed with Covid vaccine

## 2019-05-09 NOTE — Assessment & Plan Note (Signed)
He has significant wheezes on exam I recommend over-the-counter antihistamines as the patient appears somewhat congested He will continue to follow-up with pulmonologist for COPD management

## 2019-05-09 NOTE — Progress Notes (Signed)
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 [facility]) His last imaging studies showed excellent response to therapy He is not in complete remission but overall no evidence of disease progression I will continue port flush every other month and I will see him again in about 8 weeks  Due to excellent control of his disease, I do not plan to repeat imaging study again until a few more months he will continue treatment indefinitely  He can proceed with Covid vaccine  Thrombocytopenia ([place]) 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   All questions were answered. The patient knows to call the clinic with any problems, questions or concerns. The total time spent in the appointment was 20 minutes encounter with patients including review of chart and various tests results, discussions about plan of care and coordination of care plan   [name], MD 05/09/2019 10:16 AM  INTERVAL HISTORY: Please see below for problem oriented charting. 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  No evidence of aortic dissection or other acute process in the chest. 2.  Centrilobular emphysema with a 5 mm right lung nodule. Given the concurrent centrilobular emphysema, follow-up chest CT at 6 -12 months is recommended.  3.  Coronary artery atherosclerosis which is age advanced. 4.  Prominent thoracic lymph nodes.  These can be reevaluated at follow-up.   05/06/2010 Imaging   1.  Multiple small periaortic lymph nodes consistent with the patient's history of the chronic lymphocytic leukemia. 2.  No evidence of solid organ involvement   11/03/2011 Imaging   1.  Interval progression of abdominal and pelvic adenopathy. 2.  Progression of splenomegaly.  The spleen now measures 23 cm in length   11/18/2011 Bone Marrow Biopsy   Bone Marrow, Aspirate,Biopsy, and Clot, right iliac bone - HYPERCELLULAR BONE MARROW WITH EXTENSIVE INVOLVEMENT BY CHRONIC LYMPHOCYTIC LEUKEMIA. PERIPHERAL BLOOD: - CHRONIC LYMPHOCYTIC LEUKEMIA   03/08/2012 Imaging   1.  Progressive increase in retroperitoneal, iliac, and inguinal lymphadenopathy. 2.  Interval increase in massive splenomegaly.    06/01/2012 Procedure   Placement of single lumen port a cath via right internal jugular vein.  The catheter tip lies at the cavoatrial junction.  A power injectable port a cath was placed and is ready for immediate use   06/20/2012 - 11/23/2012 Chemotherapy   He received FCR x 6 cycles   12/19/2012 Imaging   Left common iliac stent. Abdominal vasculature remains  patent. Improving supraclavicular and axillary lymphadenopathy. Residual right subpectoral nodes measure up to 10 mm short axis. Improving retroperitoneal lymphadenopathy, measuring up to 16 mm short axis. Improving splenomegaly, measuring 18.7 cm.    01/20/2016  Imaging   1. Stable exam.  No new or progressive findings. 2. No CT findings to explain odynophagia   09/02/2016 Pathology Results   The findings are consistent with involvement by previously known chronic lymphocytic leukemia   09/02/2016 Pathology Results   FISH for CLL came back positive for deletion 13q   09/15/2016 Imaging   1. Borderline enlarged abdominal and pelvic lymph nodes. Compared with 11/03/2011 these are decreased in size as detailed above. 2. Persistent splenomegaly. 3. Aortic Atherosclerosis (ICD10-I70.0). LAD coronary artery calcification noted.   10/06/2016 - 02/24/2017 Chemotherapy   He received Bendamustine and Rituxan   11/03/2016 Adverse Reaction   Dose of Bendamustine is reduced due to severe pancytopenia   12/28/2016 Imaging   1. Borderline enlarged abdominal peritoneal ligament and abdominal retroperitoneal lymph nodes, stable. 2. Splenomegaly. 3.  Aortic atherosclerosis (ICD10-170.0).   03/22/2017 PET scan   1. No hypermetabolic adenopathy identified within the neck, chest, abdomen or pelvis. 2. Prominent left retroperitoneal node measures 1.6 cm without significant FDG uptake. 3. Splenomegaly. 4. Aortic Atherosclerosis (ICD10-I70.0) and Emphysema (ICD10-J43.9). LAD and left circumflex atherosclerotic calcifications noted.   02/02/2018 Procedure   IMPRESSION: Widely patent right IJ power port catheter.   04/06/2018 Imaging   1. Interval enlargement of axillary, mediastinal, and retroperitoneal lymph nodes, as well as increased splenomegaly, findings concerning for progression of CLL in comparison to prior PET-CT dated 03/22/2017.  2.  Other chronic and incidental findings as detailed above.    04/16/2018 -  Chemotherapy   The patient had acalabrutinib for chemo   04/17/2018 - 04/21/2018 Hospital Admission   He was hospitalized for influenza   11/29/2018 Imaging   1. Interval response to therapy. No thoracic added not scratch set no thoracic or pelvic  adenopathy identified. Significant improvement in abdominal adenopathy. Largest remaining lymph node measures 1.3cm in the left retroperitoneal region. Previously 2.5 cm. 2. Persistent splenomegaly, improved from previous exam. 3. No new or progressive disease identified within the chest, abdomen or pelvis. 4. Stable appearance of 5 mm right middle lobe lung nodule. 5. Aortic Atherosclerosis (ICD10-I70.0) and Emphysema (ICD10-J43.9). Coronary artery calcifications.       REVIEW OF SYSTEMS:   Constitutional: Denies fevers, chills or abnormal weight loss Eyes: Denies blurriness of vision Ears, nose, mouth, throat, and face: Denies mucositis or sore throat Cardiovascular: Denies palpitation, chest discomfort or lower extremity swelling Gastrointestinal:  Denies nausea, heartburn or change in bowel habits Lymphatics: Denies new lymphadenopathy  Neurological:Denies numbness, tingling or new weaknesses Behavioral/Psych: Mood is stable, no new changes  All other systems were reviewed with the patient and are negative.  I have reviewed the past medical history, past surgical history, social history and family history with the patient and they are unchanged from previous note.  ALLERGIES:  is allergic to codeine.  MEDICATIONS:  Current Outpatient Medications  Medication Sig Dispense Refill  . Magnesium 250 MG TABS Take 250 mg by mouth daily.    Marland Kitchen acyclovir (ZOVIRAX) 400 MG tablet TAKE 1 TABLET BY MOUTH TWICE A DAY 180 tablet 1  . albuterol (PROVENTIL HFA) 108 (90 Base) MCG/ACT inhaler TAKE 2 PUFFS BY MOUTH EVERY 6 HOURS AS NEEDED FOR WHEEZE OR SHORTNESS OF BREATH 18 g 11  . allopurinol (ZYLOPRIM) 300 MG tablet TAKE 1 TABLET  BY MOUTH EVERY DAY 90 tablet 1  . ALPRAZolam (XANAX) 0.25 MG tablet Take 0.25 mg by mouth as needed for anxiety.    Marland Kitchen b complex vitamins tablet Take 1 tablet by mouth daily.     . benzonatate (TESSALON) 100 MG capsule Take 2 capsules (200 mg total) by mouth 2 (two) times  daily as needed for cough. 20 capsule 0  . budesonide-formoterol (SYMBICORT) 160-4.5 MCG/ACT inhaler Inhale 2 puffs into the lungs 2 (two) times daily. 1 Inhaler 5  . CALQUENCE 100 MG capsule TAKE 100 MG BY MOUTH 2 (TWO) TIMES DAILY. 60 capsule 11  . Cholecalciferol (VITAMIN D-1000 MAX ST) 1000 units tablet Take 1,000 Units by mouth daily.     . famotidine (PEPCID) 20 MG tablet TAKE 1 TABLET BY MOUTH TWICE A DAY 180 tablet 3  . fluticasone (FLONASE) 50 MCG/ACT nasal spray Place 2 sprays into the nose daily.     . furosemide (LASIX) 20 MG tablet TAKE 1 TABLET BY MOUTH EVERY DAY AS NEEDED 90 tablet 2  . HYDROcodone-acetaminophen (NORCO) 10-325 MG tablet Take 1 tablet by mouth See admin instructions. Every 6 to 8 hours as needed for pain  0  . lidocaine-prilocaine (EMLA) cream APPLY TO AFFECTED AREA ONCE 30 g 3  . loperamide (IMODIUM) 2 MG capsule TAKE 2 CAPSULES BY MOUTH AS NEEDED DIARRHEA OR LOOSE STOOLS 60 capsule 2  . meclizine (ANTIVERT) 12.5 MG tablet Take 12.5 mg by mouth every 6 (six) hours as needed for dizziness.   0  . metFORMIN (GLUCOPHAGE-XR) 500 MG 24 hr tablet Take 500 mg by mouth See admin instructions. Takes one tablet in AM and 2 tablets at night.    . nitroGLYCERIN (NITROSTAT) 0.4 MG SL tablet PLACE 1 TABLET UNDER THE TONGUE EVERY 5 MINUTES X 3 DOSES AS NEEDED FOR CHEST PAIN *MAX 3 DOSES* 25 tablet 3  . nortriptyline (PAMELOR) 25 MG capsule TAKE 4 CAPSULES BY MOUTH AT BEDTIME. NEED TO SEE DOCTOR FOR REFILLS (CALL [phone]) 360 capsule 1  . pregabalin (LYRICA) 200 MG capsule Take 1 capsule (200 mg total) by mouth 3 (three) times daily. 270 capsule 1  . rosuvastatin (CRESTOR) 5 MG tablet Take 1 tablet (5 mg total) by mouth daily. 90 tablet 3  . sildenafil (VIAGRA) 100 MG tablet Take 0.5-1 tablets (50-100 mg total) by mouth as needed for erectile dysfunction. 10 tablet 3  . zolpidem (AMBIEN) 10 MG tablet Take 10 mg by mouth at bedtime as needed for sleep.     No current  facility-administered medications for this visit.   Facility-Administered Medications Ordered in Other Visits  Medication Dose Route Frequency Provider Last Rate Last Admin  . 0.9 %  sodium chloride infusion   Intravenous Continuous [name], MD 50 mL/hr at 03/07/14 1005 New Bag at 03/07/14 1005  . sodium chloride 0.9 % injection 10 mL  10 mL Intracatheter PRN Marcy Panning, MD   10 mL at 08/22/12 1721    PHYSICAL EXAMINATION: ECOG PERFORMANCE STATUS: 1 - Symptomatic but completely ambulatory Blood pressure 123/60 Heart rate 69 Respiration rate 18  GENERAL:alert, no distress and comfortable SKIN: Noted significant skin bruises EYES: normal, Conjunctiva are pink and non-injected, sclera clear OROPHARYNX:no exudate, no erythema and lips, buccal mucosa, and tongue normal  NECK: supple, thyroid normal size, non-tender, without nodularity LYMPH:  no palpable lymphadenopathy in the cervical, axillary or inguinal LUNGS: Diffuse scattered wheezes throughout HEART: regular rate & rhythm and no murmurs and no lower extremity edema  ABDOMEN:abdomen soft, non-tender and normal bowel sounds Musculoskeletal:no cyanosis of digits and no clubbing  NEURO: alert & oriented x 3 with fluent speech, no focal motor/sensory deficits  LABORATORY DATA:  I have reviewed the data as listed    Component Value Date/Time   NA 141 05/09/2019 0930   NA 136 01/26/2017 0940   K 4.4 05/09/2019 0930   K 4.0 01/26/2017 0940   CL 105 05/09/2019 0930   CL 104 08/03/2012 1229   CO2 27 05/09/2019 0930   CO2 23 01/26/2017 0940   GLUCOSE 96 05/09/2019 0930   GLUCOSE 146 (H) 01/26/2017 0940   GLUCOSE 223 (H) 08/03/2012 1229   BUN 12 05/09/2019 0930   BUN 9.6 01/26/2017 0940   CREATININE 0.86 05/09/2019 0930   CREATININE 1.04 04/24/2018 0821   CREATININE 1.0 01/26/2017 0940   CALCIUM 9.2 05/09/2019 0930   CALCIUM 8.7 01/26/2017 0940   PROT 6.1 (L) 05/09/2019 0930   PROT 5.9 (L) 04/13/2017 0823   PROT 5.7 (L)  01/26/2017 0940   ALBUMIN 4.0 05/09/2019 0930   ALBUMIN 3.6 01/26/2017 0940   AST 27 05/09/2019 0930   AST 13 (L) 04/24/2018 0821   AST 13 01/26/2017 0940   ALT 29 05/09/2019 0930   ALT 10 04/24/2018 0821   ALT 12 01/26/2017 0940   ALKPHOS 100 05/09/2019 0930   ALKPHOS 78 01/26/2017 0940   BILITOT 0.6 05/09/2019 0930   BILITOT 0.7 04/24/2018 0821   BILITOT 0.64 01/26/2017 0940   GFRNONAA >60 05/09/2019 0930   GFRNONAA >60 04/24/2018 0821   GFRAA >60 05/09/2019 0930   GFRAA >60 04/24/2018 0821    No results found for: SPEP, UPEP  Lab Results  Component Value Date   WBC 6.8 05/09/2019   NEUTROABS 4.6 05/09/2019   HGB 15.4 05/09/2019   HCT 46.1 05/09/2019   MCV 102.2 (H) 05/09/2019   PLT 64 (L) 05/09/2019      Chemistry      Component Value Date/Time   NA 141 05/09/2019 0930   NA 136 01/26/2017 0940   K 4.4 05/09/2019 0930   K 4.0 01/26/2017 0940   CL 105 05/09/2019 0930   CL 104 08/03/2012 1229   CO2 27 05/09/2019 0930   CO2 23 01/26/2017 0940   BUN 12 05/09/2019 0930   BUN 9.6 01/26/2017 0940   CREATININE 0.86 05/09/2019 0930   CREATININE 1.04 04/24/2018 0821   CREATININE 1.0 01/26/2017 0940      Component Value Date/Time   CALCIUM 9.2 05/09/2019 0930   CALCIUM 8.7 01/26/2017 0940   ALKPHOS 100 05/09/2019 0930   ALKPHOS 78 01/26/2017 0940   AST 27 05/09/2019 0930   AST 13 (L) 04/24/2018 0821   AST 13 01/26/2017 0940   ALT 29 05/09/2019 0930   ALT 10 04/24/2018 0821   ALT 12 01/26/2017 0940   BILITOT 0.6 05/09/2019 0930   BILITOT 0.7 04/24/2018 0821   BILITOT 0.64 01/26/2017 0940

## 2019-05-09 NOTE — Assessment & Plan Note (Signed)
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

## 2019-05-09 NOTE — Telephone Encounter (Signed)
Scheduled per 3/25 sch msg. Called and left a msg. Mailing printout

## 2019-05-09 NOTE — Patient Instructions (Signed)

## 2019-06-05 NOTE — Progress Notes (Signed)
CARDIOLOGY OFFICE NOTE  Date:  06/19/2019    Ryan Copeland Date of Birth: 22-Aug-1950 Medical Record #[id number]  PCP:  [name]., PA-C  Cardiologist:  Servando Snare Nahser  Chief Complaint  Patient presents with  . Follow-up    Seen for Dr. Acie Fredrickson    History of Present Illness: Ryan Copeland is a 69 y.o. male who presents today for a follow up visit. Seen for Dr. Acie Fredrickson. Former patient of Dr. Susa Simmonds.Primarily follows with me.  Has known prior NSTEMI and stenting of the LAD in 2011. Underwent repeat cath due to refractory symptoms back in August of 2016 - managed medically. Other issues include tobacco abuse, PVD with past iliac stenting(previously followed at [facility]), HTN, HLD, DM, chronic thrombocytopenia and CLL- small cell B cell lymphoma. He sees neurology for his peripheral neuropathy.  Seen back in Septemberof 2017- had gained weight. Some intermittent chest heaviness - no response with NTG but I added Imdur to his regimen.When seenback in October - still with atypical chest pain - felt more like a pulled muscle but no relief with NSAID. He thought the Imdur had helped however. I talked with Dr. Acie Fredrickson - wanted to avoid Myoview - most likely it was felt to turn out abnormal. Elected to just follow for now.Ended uphaving GI work upinDecemberof 2017- this was felt to be possibly causing some of his prior chest pain -turned out to be a yeast infection and with treatment his chest pain resolved. We got him offImdur so he could resume Viagra. Still struggling with trying to stop smoking.He did have to go back on treatment for his CLL in 2018.   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 Hadsome swelling of his lower legs at a past visit with me- not short of breath. I put him on very low dose  prn Lasix. Not smoking. No chest pain. Cardiac statuswasfelt to be stable.   I last saw him by a telehealth visit in April -he was doing well. He had been admitted in March with flu A. Metoprolol was to be cut back - he did not do that. Using Lasix just prn. Otherwise was stable from our standpoint. We last saw him back in October - he had retired. Losing weight intentionally. Little lightheaded at times. He felt like he was doing well.    The patient does not have symptoms concerning for COVID-19 infection (fever, chills, cough, or new shortness of breath).   Comes in today. Here alone. Complains of cramps - in his legs at night. Has in his arms in the evenings. No chest pain. He will note some soreness if he mashes on his chest - nothing exertional. His breathing is stable. He has COPD - has upcoming PFTs with pulmonary. Chronic cough. He has chronic bruising - platelets chronically low but stable. Does endorse dizziness - no syncope. BP good but low normal - typically does not use salt.     Past Medical History:  Diagnosis Date  . Anxiety 01/30/2014  . Back injury    lower disc  . CAD (coronary artery disease)   . CLL (chronic lymphocytic leukemia) ([place]) 03/18/2011  . Diabetes mellitus ([place])    Type 2   . ECRB (extensor carpi radialis brevis) tenosynovitis   . GERD (gastroesophageal reflux disease)    takes Nexium if  needed  . Hyperlipidemia   . Lateral epicondylitis of left elbow   . MI, acute, non ST segment elevation ([place]) 06/28/2009   with stenting of the LAD  . Neuromuscular disorder ([place])    peripheral neuropathy  . PVD (peripheral vascular disease) ([place])   . Tobacco abuse     Past Surgical History:  Procedure Laterality Date  . ADENOIDECTOMY  1955  . CARDIAC CATHETERIZATION    . CARDIAC CATHETERIZATION N/A 09/26/2014   Procedure: Left Heart Cath and Coronary Angiography;  Surgeon: Peter M Martinique, MD;  Location: [facility];  Service: Cardiovascular;  Laterality:  N/A;  . carpel tunnel release Left 04-1989  . carpel tunnel release  Right 01-1989  . CHOLECYSTECTOMY  2007  . CORONARY STENT PLACEMENT  May 2011  . femoral stents    . IR CV LINE INJECTION  08/18/2017  . IR CV LINE INJECTION  09/01/2017  . IR CV LINE INJECTION  02/02/2018  . LATERAL EPICONDYLE RELEASE Left 02/12/2014   Procedure: LEFT ELBOW DEBRIDEMENT WITH TENDON REPAIR ;  Surgeon: Lorn Junes, MD;  Location: [facility];  Service: Orthopedics;  Laterality: Left;  . LEFT CAI STENT/PTA AND POPLITEAL ARTERY/TIBIAL THROMBECTOMY     . LEFT HEART CATHETERIZATION WITH CORONARY ANGIOGRAM N/A 08/26/2011   Procedure: LEFT HEART CATHETERIZATION WITH CORONARY ANGIOGRAM;  Surgeon: Peter M Martinique, MD;  Location: [facility] C[facility];  Service: Cardiovascular;  Laterality: N/A;  . PERIPHERAL VASCULAR CATHETERIZATION N/A 01/01/2015   Procedure: Abdominal Aortogram;  Surgeon: Conrad Melbeta, MD;  Location: [facility];  Service: Cardiovascular;  Laterality: N/A;  . TARSAL TUNNEL RELEASE Bilateral 08-2007     Medications: Current Meds  Medication Sig  . acyclovir (ZOVIRAX) 400 MG tablet TAKE 1 TABLET BY MOUTH TWICE A DAY  . albuterol (PROVENTIL HFA) 108 (90 Base) MCG/ACT inhaler TAKE 2 PUFFS BY MOUTH EVERY 6 HOURS AS NEEDED FOR WHEEZE OR SHORTNESS OF BREATH  . allopurinol (ZYLOPRIM) 300 MG tablet TAKE 1 TABLET BY MOUTH EVERY DAY  . ALPRAZolam (XANAX) 0.25 MG tablet Take 0.25 mg by mouth as needed for anxiety.  Marland Kitchen b complex vitamins tablet Take 1 tablet by mouth daily.   . benzonatate (TESSALON) 100 MG capsule Take 2 capsules (200 mg total) by mouth 2 (two) times daily as needed for cough.  . budesonide-formoterol (SYMBICORT) 160-4.5 MCG/ACT inhaler Inhale 2 puffs into the lungs 2 (two) times daily.  Marland Kitchen CALQUENCE 100 MG capsule TAKE 100 MG BY MOUTH 2 (TWO) TIMES DAILY.  Marland Kitchen Cholecalciferol (VITAMIN D-1000 MAX ST) 1000 units tablet Take 1,000 Units by mouth daily.   . famotidine (PEPCID) 20 MG tablet TAKE 1 TABLET  BY MOUTH TWICE A DAY  . fluticasone (FLONASE) 50 MCG/ACT nasal spray Place 2 sprays into the nose daily.   . furosemide (LASIX) 20 MG tablet TAKE 1 TABLET BY MOUTH EVERY DAY AS NEEDED  . HYDROcodone-acetaminophen (NORCO) 10-325 MG tablet Take 1 tablet by mouth See admin instructions. Every 6 to 8 hours as needed for pain  . lidocaine-prilocaine (EMLA) cream APPLY TO AFFECTED AREA ONCE  . loperamide (IMODIUM) 2 MG capsule TAKE 2 CAPSULES BY MOUTH AS NEEDED DIARRHEA OR LOOSE STOOLS  . Magnesium 250 MG TABS Take 250 mg by mouth daily.  . meclizine (ANTIVERT) 12.5 MG tablet Take 12.5 mg by mouth every 6 (six) hours as needed for dizziness.   . metFORMIN (GLUCOPHAGE-XR) 500 MG 24 hr tablet Take 500 mg by mouth See admin instructions.  Takes one tablet in AM and 2 tablets at night.  . nitroGLYCERIN (NITROSTAT) 0.4 MG SL tablet PLACE 1 TABLET UNDER THE TONGUE EVERY 5 MINUTES X 3 DOSES AS NEEDED FOR CHEST PAIN *MAX 3 DOSES*  . nortriptyline (PAMELOR) 25 MG capsule TAKE 4 CAPSULES BY MOUTH AT BEDTIME. NEED TO SEE DOCTOR FOR REFILLS (CALL [phone])  . pregabalin (LYRICA) 200 MG capsule Take 1 capsule (200 mg total) by mouth 3 (three) times daily.  . rosuvastatin (CRESTOR) 5 MG tablet Take 1 tablet (5 mg total) by mouth daily.  . sildenafil (VIAGRA) 100 MG tablet Take 0.5-1 tablets (50-100 mg total) by mouth as needed for erectile dysfunction.  Marland Kitchen zolpidem (AMBIEN) 10 MG tablet Take 10 mg by mouth at bedtime as needed for sleep.     Allergies: Allergies  Allergen Reactions  . Codeine Hives    Pt states he can take a few, more reaction with extended doses.    Social History: The patient  reports that he quit smoking about 3 years ago. His smoking use included cigarettes. He has a 44.00 pack-year smoking history. He has quit using smokeless tobacco. He reports that he does not drink alcohol or use drugs.   Family History: The patient's family history includes Cancer in his mother; Heart disease in  his father; Heart failure (age of onset: 70) in his father; Lung cancer (age of onset: 24) in his mother.   Review of Systems: Please see the history of present illness.   All other systems are reviewed and negative.   Physical Exam: VS:  BP 118/70   Pulse 67   Ht 6' (1.829 m)   Wt 184 lb (83.5 kg)   BMI 24.95 kg/m  .  BMI Body mass index is 24.95 kg/m.  Wt Readings from Last 3 Encounters:  06/19/19 184 lb (83.5 kg)  03/15/19 193 lb 3.2 oz (87.6 kg)  02/27/19 194 lb 6.4 oz (88.2 kg)    General: Pleasant. Alert in no acute distress.  Weight is down 6 pounds since my last visit from October.  Cardiac: Regular rate and rhythm. No murmurs, rubs, or gallops. No edema.  Respiratory:  Lungs are coarse. Congested cough. He has normal work of breathing.  GI: Soft and nontender.  MS: No deformity or atrophy. Gait and ROM intact.  Skin: Warm and dry. Color is normal.  Neuro:  Strength and sensation are intact and no gross focal deficits noted.  Psych: Alert, appropriate and with normal affect.   LABORATORY DATA:  EKG:  EKG is ordered today.  Personally reviewed by me. This demonstrates NSR j- HR of 67 - no acute changes.  Lab Results  Component Value Date   WBC 6.8 05/09/2019   HGB 15.4 05/09/2019   HCT 46.1 05/09/2019   PLT 64 (L) 05/09/2019   GLUCOSE 96 05/09/2019   CHOL 77 (L) 12/12/2018   TRIG 91 12/12/2018   HDL 28 (L) 12/12/2018   LDLDIRECT 117.6 08/22/2011   LDLCALC 31 12/12/2018   ALT 29 05/09/2019   AST 27 05/09/2019   NA 141 05/09/2019   K 4.4 05/09/2019   CL 105 05/09/2019   CREATININE 0.86 05/09/2019   BUN 12 05/09/2019   CO2 27 05/09/2019   TSH 1.620 04/13/2017   INR 1.0 09/22/2014   HGBA1C 6.2 (H) 04/13/2017     BNP (last 3 results) No results for input(s): BNP in the last 8760 hours.  ProBNP (last 3 results) No results for input(s): PROBNP in  the last 8760 hours.   Other Studies Reviewed Today:  Coronary angiography from 09/2014:  Coronary  dominance: right  Left mainstem: Normal.  Left anterior descending (LAD): The stent in the proximal LAD is widely patent throughout. There is moderate 20% narrowing in the LAD proximal to the stent. The remainder of the vessels without significant disease.  Left circumflex (LCx): The left circumflex gives rise to 2 large marginal branches. There is 20% narrowing prior to the takeoff of the first OM.  Right coronary artery (RCA): The right coronary is a dominant vessel. It has diffuse 70% disease in the proximal to mid vessel. Is occluded at the crux. There are excellent right to right and left to right collaterals.  Left ventriculography: Left ventricular systolic function is normal, LVEF is estimated at 55-65%, there is no significant mitral regurgitation  Final Conclusions:  1. Single vessel obstructive coronary disease. 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 The stent in the proximal LAD is widely patent.  2. Normal LV function.  Recommendations: Continue medical management.  Peter Martinique  08/26/2011, 9:09 AM   ASSESSMENT & PLAN:   1. Cramps - will check potassium and mag level. Suggestions given.   2. CAD - stable cath from 2016 - has chronic total occlusion of RCA with good collateral flow - patent stent in proximal LAD. He has no worrisome symptoms. Would continue with CV risk factor modification.   3. HTN - BP is soft - not on any hypertensive medicine. Ok to liberalize his salt a bit.   4. HLD - checking lipids today.   5. COPD/past tobacco abuse - per pulmonary  6. CLL/chronic thrombocytopenia - followed by oncology/hematology.   7. NIDDM - per PCP  8. Obesity - continues to lose weight.   9. ED - on Viagra - not discussed.   10. PAD with priorL CIA PTA+S, L pop artery thrombectomy by Dr. Amedeo Plenty- has not seen [facility] since 01/2016 - he has not felt that he needed follow up. Denies claudication. Has stopped smoking.    34. COVID-19 Education: The signs and symptoms of COVID-19 were discussed with the patient and how to seek care for testing (follow up with PCP or arrange E-visit).  The importance of social distancing, staying at home, hand hygiene and wearing a mask when out in public were discussed today. He is not sure about taking a vaccine.   Current medicines are reviewed with the patient today.  The patient does not have concerns regarding medicines other than what has been noted above.  The following changes have been made:  See above.  Labs/ tests ordered today include:    Orders Placed This Encounter  Procedures  . Lipid panel  . Basic metabolic panel  . Magnesium  . EKG 12-Lead     Disposition:   FU with me in 6 months.   Patient is agreeable to this plan and will call if any problems develop in the interim.   SignedTruitt Merle, NP  06/19/2019 8:40 AM  [facility] [address] [place], [place]  [address] Phone: [phone] Fax: [phone]

## 2019-06-10 MED FILL — CALQUENCE 100 MG CAPSULE: 100 | 30 days supply | Qty: 60 | Fill #2

## 2019-06-12 ENCOUNTER — Ambulatory Visit: Payer: Medicare Other | Admitting: Nurse Practitioner

## 2019-06-19 ENCOUNTER — Other Ambulatory Visit: Payer: Self-pay

## 2019-06-19 ENCOUNTER — Ambulatory Visit (INDEPENDENT_AMBULATORY_CARE_PROVIDER_SITE_OTHER): Payer: Medicare Other | Admitting: Nurse Practitioner

## 2019-06-19 ENCOUNTER — Encounter: Payer: Self-pay | Admitting: Nurse Practitioner

## 2019-06-19 VITALS — BP 118/70 | HR 67 | Ht 72.0 in | Wt 184.0 lb

## 2019-06-19 DIAGNOSIS — I259 Chronic ischemic heart disease, unspecified: Secondary | ICD-10-CM | POA: Diagnosis not present

## 2019-06-19 DIAGNOSIS — E7800 Pure hypercholesterolemia, unspecified: Secondary | ICD-10-CM

## 2019-06-19 DIAGNOSIS — Z7189 Other specified counseling: Secondary | ICD-10-CM

## 2019-06-19 DIAGNOSIS — I10 Essential (primary) hypertension: Secondary | ICD-10-CM

## 2019-06-19 DIAGNOSIS — R252 Cramp and spasm: Secondary | ICD-10-CM

## 2019-06-19 LAB — LIPID PANEL
Chol/HDL Ratio: 2.3 ratio (ref 0.0–5.0)
Cholesterol, Total: 79 mg/dL — ABNORMAL LOW (ref 100–199)
HDL: 34 mg/dL — ABNORMAL LOW (ref >39)
LDL Chol Calc (NIH): 33 mg/dL (ref 0–99)
Triglycerides: 44 mg/dL (ref 0–149)
VLDL Cholesterol Cal: 12 mg/dL (ref 5–40)

## 2019-06-19 LAB — BASIC METABOLIC PANEL
BUN/Creatinine Ratio: 15 (ref 10–24)
BUN: 13 mg/dL (ref 8–27)
CO2: 23 mmol/L (ref 20–29)
Calcium: 9.1 mg/dL (ref 8.6–10.2)
Chloride: 107 mmol/L — ABNORMAL HIGH (ref 96–106)
Creatinine, Ser: 0.84 mg/dL (ref 0.76–1.27)
GFR calc Af Amer: 104 mL/min/{1.73_m2} (ref >59)
GFR calc non Af Amer: 90 mL/min/{1.73_m2} (ref >59)
Glucose: 123 mg/dL — ABNORMAL HIGH (ref 65–99)
Potassium: 4.3 mmol/L (ref 3.5–5.2)
Sodium: 142 mmol/L (ref 134–144)

## 2019-06-19 LAB — MAGNESIUM: Magnesium: 2 mg/dL (ref 1.6–2.3)

## 2019-06-19 NOTE — Patient Instructions (Addendum)
After Visit Summary:  We will be checking the following labs today - Lipids, BMET and Mag level   Medication Instructions:    Continue with your current medicines.    If you need a refill on your cardiac medications before your next appointment, please call your pharmacy.     Testing/Procedures To Be Arranged:  N/A  Follow-Up:   See me in 6 months    At [facility], you and your health needs are our priority.  As part of our continuing mission to provide you with exceptional heart care, we have created designated Provider Care Teams.  These Care Teams include your primary Cardiologist (physician) and Advanced Practice Providers (APPs -  Physician Assistants and Nurse Practitioners) who all work together to provide you with the care you need, when you need it.  Special Instructions:  . Stay safe, stay home, wash your hands for at least 20 seconds and wear a mask when out in public.  . It was good to talk with you today.    Call the [place] office at [phone] if you have any questions, problems or concerns.

## 2019-06-24 ENCOUNTER — Other Ambulatory Visit (HOSPITAL_COMMUNITY)
Admission: RE | Admit: 2019-06-24 | Discharge: 2019-06-24 | Disposition: A | Discharge status: Home / Self Care | Payer: Medicare Other | Source: Ambulatory Visit | Attending: Internal Medicine | Admitting: Internal Medicine

## 2019-06-24 DIAGNOSIS — Z20822 Contact with and (suspected) exposure to covid-19: Secondary | ICD-10-CM | POA: Insufficient documentation

## 2019-06-24 DIAGNOSIS — Z01812 Encounter for preprocedural laboratory examination: Secondary | ICD-10-CM | POA: Diagnosis present

## 2019-06-24 LAB — SARS CORONAVIRUS 2 (TAT 6-24 HRS): SARS Coronavirus 2: NEGATIVE

## 2019-06-25 ENCOUNTER — Telehealth: Payer: Self-pay | Admitting: *Deleted

## 2019-06-25 ENCOUNTER — Telehealth: Payer: Self-pay | Admitting: Internal Medicine

## 2019-06-25 NOTE — Telephone Encounter (Signed)
Patient's order is for Spiro/Feno with bronchodilator. Pt has been scheduled on PFT schedule. Called pt to see if he can come on Friday for spiro/feno clinic. Left message to call back.

## 2019-06-25 NOTE — Telephone Encounter (Signed)
Pt not able to change appt to Friday for Spiro/feno clinic Feno will have to been done before spiro on Thursday. Nothing further needed.

## 2019-06-26 ENCOUNTER — Other Ambulatory Visit: Payer: Self-pay | Admitting: *Deleted

## 2019-06-26 DIAGNOSIS — J432 Centrilobular emphysema: Secondary | ICD-10-CM

## 2019-06-27 ENCOUNTER — Other Ambulatory Visit: Payer: Self-pay

## 2019-06-27 ENCOUNTER — Ambulatory Visit (INDEPENDENT_AMBULATORY_CARE_PROVIDER_SITE_OTHER): Payer: Medicare Other | Admitting: Internal Medicine

## 2019-06-27 ENCOUNTER — Encounter: Payer: Self-pay | Admitting: Internal Medicine

## 2019-06-27 VITALS — BP 90/50 | HR 68 | Temp 97.2°F | Ht 71.0 in | Wt 184.4 lb

## 2019-06-27 DIAGNOSIS — J432 Centrilobular emphysema: Secondary | ICD-10-CM

## 2019-06-27 DIAGNOSIS — J301 Allergic rhinitis due to pollen: Secondary | ICD-10-CM

## 2019-06-27 LAB — PULMONARY FUNCTION TEST
FEF 25-75 Post: 2.47 L/sec
FEF 25-75 Pre: 2.27 L/sec
FEF2575-%Change-Post: 8 %
FEF2575-%Pred-Post: 92 %
FEF2575-%Pred-Pre: 85 %
FEV1-%Change-Post: 3 %
FEV1-%Pred-Post: 81 %
FEV1-%Pred-Pre: 78 %
FEV1-Post: 2.82 L
FEV1-Pre: 2.72 L
FEV1FVC-%Change-Post: 1 %
FEV1FVC-%Pred-Pre: 101 %
FEV6-%Change-Post: 2 %
FEV6-%Pred-Post: 83 %
FEV6-%Pred-Pre: 81 %
FEV6-Post: 3.7 L
FEV6-Pre: 3.61 L
FEV6FVC-%Change-Post: 0 %
FEV6FVC-%Pred-Post: 105 %
FEV6FVC-%Pred-Pre: 105 %
FVC-%Change-Post: 2 %
FVC-%Pred-Post: 79 %
FVC-%Pred-Pre: 77 %
FVC-Post: 3.7 L
FVC-Pre: 3.62 L
Post FEV1/FVC ratio: 76 %
Post FEV6/FVC ratio: 100 %
Pre FEV1/FVC ratio: 75 %
Pre FEV6/FVC Ratio: 100 %

## 2019-06-27 LAB — NITRIC OXIDE: Nitric Oxide: 57

## 2019-06-27 MED ORDER — MONTELUKAST SODIUM 10 MG PO TABS: 10.0000 mg | ORAL_TABLET | Freq: Every day | ORAL | 5 refills | Status: DC

## 2019-06-27 MED ORDER — BREO ELLIPTA 200-25 MCG/INH IN AEPB: 1.0000 | INHALATION_SPRAY | Freq: Every day | RESPIRATORY_TRACT | 5 refills | Status: DC

## 2019-06-27 MED ORDER — ALBUTEROL SULFATE HFA 108 (90 BASE) MCG/ACT IN AERS: INHALATION_SPRAY | RESPIRATORY_TRACT | 11 refills | Status: DC

## 2019-06-27 MED ORDER — ANORO ELLIPTA 62.5-25 MCG/INH IN AEPB: 1.0000 | INHALATION_SPRAY | Freq: Every day | RESPIRATORY_TRACT | 5 refills | Status: DC

## 2019-06-27 NOTE — Patient Instructions (Addendum)
The patient should have follow up scheduled with myself in 4 months.   Stop symbicort. Start Breo. Which is a once a day inhaler for COPD.  Continue as needed albuterol.  Start taking singulair - allergy medicine, once a day. Can still take benadryl or cetirizine (zyrtec) at night time with this.   Flonase - 1 spray on each side of your nose twice a day for first week, then 1 spray on each side.   Instructions for use:  If you also use a saline nasal spray or rinse, use that first.  Position the head with the chin slightly tucked. Use the right hand to spray into the left nostril and the right hand to spray into the left nostril.   Point the bottle away from the septum of your nose (cartilage that divides the two sides of your nose).   Hold the nostril closed on the opposite side from where you will spray  Spray once and gently sniff to pull the medicine into the higher parts of your nose.  Don't sniff too hard as the medicine will drain down the back of your throat instead.  Repeat with a second spray on the same side if prescribed.  Repeat on the other side of your nose.

## 2019-06-27 NOTE — Progress Notes (Signed)
Ryan Copeland    [id number]    03-25-1950  Primary Care Physician:Kaplan, Baldemar Friday., PA-C Date of Appointment: 06/27/2019 Established Patient Visit  Chief complaint:   Chief Complaint  Patient presents with  . Follow-up    PFTs.  pollen causing cough and watery eyes and stuffy head.  post nasal drip down throat.      HPI: Ryan Copeland is a 69 y.o. man with emphysema and rhinitis.   Interval Updates: Here for follow up after PFTs. Having worsening allergic rhinitis. flonase helps. Taking symbicort and prn albuterol. No hospitalizations or ED visits. Isn't sure the symbicort is doing much.  FeNO 57 ppb. Spirometry norma.   I have reviewed the patient's family social and past medical history and updated as appropriate.   Past Medical History:  Diagnosis Date  . Anxiety 01/30/2014  . Back injury    lower disc  . CAD (coronary artery disease)   . CLL (chronic lymphocytic leukemia) ([place]) 03/18/2011  . Diabetes mellitus ([place])    Type 2   . ECRB (extensor carpi radialis brevis) tenosynovitis   . GERD (gastroesophageal reflux disease)    takes Nexium if needed  . Hyperlipidemia   . Lateral epicondylitis of left elbow   . MI, acute, non ST segment elevation ([place]) 06/28/2009   with stenting of the LAD  . Neuromuscular disorder ([place])    peripheral neuropathy  . PVD (peripheral vascular disease) ([place])   . Tobacco abuse     Past Surgical History:  Procedure Laterality Date  . ADENOIDECTOMY  1955  . CARDIAC CATHETERIZATION    . CARDIAC CATHETERIZATION N/A 09/26/2014   Procedure: Left Heart Cath and Coronary Angiography;  Surgeon: Peter M Martinique, MD;  Location: [facility];  Service: Cardiovascular;  Laterality: N/A;  . carpel tunnel release Left 04-1989  . carpel tunnel release  Right 01-1989  . CHOLECYSTECTOMY  2007  . CORONARY STENT PLACEMENT  May 2011  . femoral stents    . IR CV LINE INJECTION  08/18/2017  . IR CV LINE INJECTION  09/01/2017  . IR CV  LINE INJECTION  02/02/2018  . LATERAL EPICONDYLE RELEASE Left 02/12/2014   Procedure: LEFT ELBOW DEBRIDEMENT WITH TENDON REPAIR ;  Surgeon: Lorn Junes, MD;  Location: [facility];  Service: Orthopedics;  Laterality: Left;  . LEFT CAI STENT/PTA AND POPLITEAL ARTERY/TIBIAL THROMBECTOMY     . LEFT HEART CATHETERIZATION WITH CORONARY ANGIOGRAM N/A 08/26/2011   Procedure: LEFT HEART CATHETERIZATION WITH CORONARY ANGIOGRAM;  Surgeon: Peter M Martinique, MD;  Location: [facility];  Service: Cardiovascular;  Laterality: N/A;  . PERIPHERAL VASCULAR CATHETERIZATION N/A 01/01/2015   Procedure: Abdominal Aortogram;  Surgeon: Conrad Rio Blanco, MD;  Location: [facility];  Service: Cardiovascular;  Laterality: N/A;  . TARSAL TUNNEL RELEASE Bilateral 08-2007    Family History  Problem Relation Age of Onset  . Lung cancer Mother 55  . Cancer Mother        lung  . Heart failure Father 91  . Heart disease Father     Social History   Occupational History    Employer: [employer]    Comment: [employer]  Tobacco Use  . Smoking status: Former Smoker    Packs/day: 1.00    Years: 44.00    Pack years: 44.00    Types: Cigarettes    Quit date: 04/23/2016    Years since quitting: 3.1  . Smokeless  tobacco: Former Network engineer and Sexual Activity  . Alcohol use: No    Alcohol/week: 0.0 standard drinks    Comment:  drinks non-alcoholic beer  . Drug use: No  . Sexual activity: Yes     Physical Exam: Blood pressure (!) 90/50, pulse 68, temperature (!) 97.2 F (36.2 C), temperature source Temporal, height 5\' 11"  (1.803 m), weight 184 lb 6.4 oz (83.6 kg), SpO2 99 %.  Gen:      No acute distress Lungs:    No increased respiratory effort, symmetric chest wall excursion, clear to auscultation bilaterally, no wheezes or crackles CV:         Regular rate and rhythm; no murmurs, rubs, or gallops.  No pedal edema   Data Reviewed: Imaging: I have personally reviewed the CT Chest from 2020 which  demonstrates mild centrilobular emphysema with peribronchial thickening.   PFTs: I have personally reviewed the patient's PFTs and spirometry is normal. No BD response.   PFTs:  PFT Results Latest Ref Rng & Units 06/27/2019  FVC-Pre L 3.62  FVC-Predicted Pre % 77  FVC-Post L 3.70  FVC-Predicted Post % 79  Pre FEV1/FVC % % 75  Post FEV1/FCV % % 76  FEV1-Pre L 2.72  FEV1-Predicted Pre % 78  FEV1-Post L 2.82    Labs: Lab Results  Component Value Date   NA 142 06/19/2019   K 4.3 06/19/2019   CL 107 (H) 06/19/2019   CO2 23 06/19/2019   Lab Results  Component Value Date   WBC 6.8 05/09/2019   HGB 15.4 05/09/2019   HCT 46.1 05/09/2019   MCV 102.2 (H) 05/09/2019   PLT 64 (L) 05/09/2019   Immunization status: Immunization History  Administered Date(s) Administered  . Fluad Quad(high Dose 65+) 11/23/2018  . Influenza,inj,Quad PF,6+ Mos 11/13/2015, 11/03/2016  . Influenza-Unspecified 11/12/2013, 12/04/2014, 11/08/2017  . Pneumococcal Conjugate-13 01/02/2015  . Pneumococcal Polysaccharide-23 06/26/2013, 05/16/2017    Assessment:  Chronic CLL - on Acalabrutinib Centrilobular Emphysema  History of Tobacco Use Disorder Seasonal allergic rhinitis  Plan/Recommendations: Continue prn albuterol.  Reviewed PFT results with him I suspect his elevated FeNO is more from rhinitis than from pulmonary inflammation. However, will initially switch to alternate ICS-LABA. If no improvement with Breo would try LAMA-LABA next instead.  Can switch from symbicort to [place]. Will continue flonase, add singulair   I spent 40 minutes on 06/27/2019 in care of this patient including face to face time and non-face to face time spent charting, review of outside records, and coordination of care. 59977 10-19 minutes or Straightforward MDM 41423 20-29 minutes or Low level MDM 95320 30-39 minutes or Moderate level MDM 23343  40-54 minutes or High level MDM   Return to Care: Return in about 6  months (around 12/28/2019).   Lenice Llamas, MD Pulmonary and [place]

## 2019-06-27 NOTE — Progress Notes (Signed)
Spirometry pre and post performed today.

## 2019-06-30 ENCOUNTER — Other Ambulatory Visit: Payer: Self-pay | Admitting: Internal Medicine

## 2019-07-04 ENCOUNTER — Inpatient Hospital Stay: Payer: Medicare Other

## 2019-07-04 ENCOUNTER — Inpatient Hospital Stay: Payer: Medicare Other | Attending: Hematology and Oncology | Admitting: Hematology and Oncology

## 2019-07-04 ENCOUNTER — Other Ambulatory Visit: Payer: Self-pay

## 2019-07-04 DIAGNOSIS — C8308 Small cell B-cell lymphoma, lymph nodes of multiple sites: Secondary | ICD-10-CM

## 2019-07-04 DIAGNOSIS — R161 Splenomegaly, not elsewhere classified: Secondary | ICD-10-CM | POA: Diagnosis not present

## 2019-07-04 DIAGNOSIS — I259 Chronic ischemic heart disease, unspecified: Secondary | ICD-10-CM | POA: Diagnosis not present

## 2019-07-04 DIAGNOSIS — D696 Thrombocytopenia, unspecified: Secondary | ICD-10-CM | POA: Insufficient documentation

## 2019-07-04 DIAGNOSIS — Z7984 Long term (current) use of oral hypoglycemic drugs: Secondary | ICD-10-CM | POA: Insufficient documentation

## 2019-07-04 DIAGNOSIS — Z85828 Personal history of other malignant neoplasm of skin: Secondary | ICD-10-CM | POA: Diagnosis not present

## 2019-07-04 DIAGNOSIS — I700 Atherosclerosis of aorta: Secondary | ICD-10-CM | POA: Diagnosis not present

## 2019-07-04 DIAGNOSIS — J449 Chronic obstructive pulmonary disease, unspecified: Secondary | ICD-10-CM | POA: Diagnosis not present

## 2019-07-04 DIAGNOSIS — I2510 Atherosclerotic heart disease of native coronary artery without angina pectoris: Secondary | ICD-10-CM | POA: Insufficient documentation

## 2019-07-04 DIAGNOSIS — C9110 Chronic lymphocytic leukemia of B-cell type not having achieved remission: Secondary | ICD-10-CM | POA: Insufficient documentation

## 2019-07-04 DIAGNOSIS — Z95828 Presence of other vascular implants and grafts: Secondary | ICD-10-CM

## 2019-07-04 DIAGNOSIS — Z79899 Other long term (current) drug therapy: Secondary | ICD-10-CM | POA: Insufficient documentation

## 2019-07-04 DIAGNOSIS — D801 Nonfamilial hypogammaglobulinemia: Secondary | ICD-10-CM

## 2019-07-04 LAB — CBC WITH DIFFERENTIAL/PLATELET
Abs Immature Granulocytes: 0.09 10*3/uL — ABNORMAL HIGH (ref 0.00–0.07)
Basophils Absolute: 0 10*3/uL (ref 0.0–0.1)
Basophils Relative: 0 %
Eosinophils Absolute: 0 10*3/uL (ref 0.0–0.5)
Eosinophils Relative: 0 %
HCT: 40.3 % (ref 39.0–52.0)
Hemoglobin: 13.4 g/dL (ref 13.0–17.0)
Immature Granulocytes: 2 %
Lymphocytes Relative: 18 %
Lymphs Abs: 1 10*3/uL (ref 0.7–4.0)
MCH: 32.3 pg (ref 26.0–34.0)
MCHC: 33.3 g/dL (ref 30.0–36.0)
MCV: 97.1 fL (ref 80.0–100.0)
Monocytes Absolute: 0.4 10*3/uL (ref 0.1–1.0)
Monocytes Relative: 8 %
Neutro Abs: 3.8 10*3/uL (ref 1.7–7.7)
Neutrophils Relative %: 72 %
Platelets: 64 10*3/uL — ABNORMAL LOW (ref 150–400)
RBC: 4.15 MIL/uL — ABNORMAL LOW (ref 4.22–5.81)
RDW: 16 % — ABNORMAL HIGH (ref 11.5–15.5)
WBC: 5.3 10*3/uL (ref 4.0–10.5)
nRBC: 0 % (ref 0.0–0.2)

## 2019-07-04 LAB — COMPREHENSIVE METABOLIC PANEL
ALT: 28 U/L (ref 0–44)
AST: 21 U/L (ref 15–41)
Albumin: 3.6 g/dL (ref 3.5–5.0)
Alkaline Phosphatase: 101 U/L (ref 38–126)
Anion gap: 8 (ref 5–15)
BUN: 9 mg/dL (ref 8–23)
CO2: 27 mmol/L (ref 22–32)
Calcium: 8.8 mg/dL — ABNORMAL LOW (ref 8.9–10.3)
Chloride: 107 mmol/L (ref 98–111)
Creatinine, Ser: 0.77 mg/dL (ref 0.61–1.24)
GFR calc Af Amer: >60 mL/min (ref >60)
GFR calc non Af Amer: >60 mL/min (ref >60)
Glucose, Bld: 93 mg/dL (ref 70–99)
Potassium: 4.3 mmol/L (ref 3.5–5.1)
Sodium: 142 mmol/L (ref 135–145)
Total Bilirubin: 0.6 mg/dL (ref 0.3–1.2)
Total Protein: 5.6 g/dL — ABNORMAL LOW (ref 6.5–8.1)

## 2019-07-04 MED ORDER — HEPARIN SOD (PORK) LOCK FLUSH 100 UNIT/ML IV SOLN
500.0000 [IU] | Freq: Once | INTRAVENOUS | Status: AC
  Administered 2019-07-04: 500 [IU]
  Filled 2019-07-04: qty 5

## 2019-07-04 MED ORDER — SODIUM CHLORIDE 0.9% FLUSH
10.0000 mL | Freq: Once | INTRAVENOUS | Status: AC
  Administered 2019-07-04: 10 mL
  Filled 2019-07-04: qty 10

## 2019-07-05 ENCOUNTER — Encounter: Payer: Self-pay | Admitting: Hematology and Oncology

## 2019-07-05 NOTE — Progress Notes (Signed)
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 [facility]) His last imaging studies showed excellent response to therapy He is not in complete remission but overall no evidence of disease progression I will continue port flush see him again in about 8 weeks  Due to excellent control of his disease, I do not plan to repeat imaging study again until a few more months he will continue treatment indefinitely   Thrombocytopenia ([place]) 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   All questions were answered. The patient knows to call the clinic with any problems, questions or concerns. The total time spent in the appointment was 20 minutes encounter with patients including review of chart and various tests results, discussions about plan of care and coordination of care plan   [name], MD 07/05/2019 9:41 AM  INTERVAL HISTORY: Please see below for problem oriented charting. 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  No evidence of aortic dissection or other acute process in the chest. 2.  Centrilobular emphysema with a 5 mm right lung nodule. Given the concurrent centrilobular emphysema, follow-up chest CT at 6 -12 months is recommended.  3.  Coronary artery atherosclerosis which is age advanced. 4.  Prominent thoracic lymph nodes.  These can be reevaluated at follow-up.   05/06/2010 Imaging   1.  Multiple small periaortic lymph nodes consistent with the patient's history of the chronic lymphocytic leukemia. 2.  No evidence of solid organ involvement   11/03/2011 Imaging   1.  Interval progression of abdominal and pelvic adenopathy. 2.  Progression of splenomegaly.  The spleen now measures 23 cm in length   11/18/2011 Bone Marrow Biopsy   Bone Marrow, Aspirate,Biopsy, and Clot, right iliac bone - HYPERCELLULAR BONE MARROW WITH EXTENSIVE INVOLVEMENT BY CHRONIC LYMPHOCYTIC LEUKEMIA. PERIPHERAL BLOOD: - CHRONIC LYMPHOCYTIC LEUKEMIA   03/08/2012 Imaging   1.  Progressive increase in retroperitoneal, iliac, and inguinal lymphadenopathy. 2.  Interval increase in massive splenomegaly.    06/01/2012 Procedure   Placement of single lumen port a cath via right internal jugular vein.  The catheter tip lies at the cavoatrial junction.  A power injectable port a cath was  placed and is ready for immediate use   06/20/2012 - 11/23/2012 Chemotherapy   He received FCR x 6 cycles   12/19/2012 Imaging   Left common iliac stent. Abdominal vasculature remains patent. Improving supraclavicular and  axillary lymphadenopathy. Residual right subpectoral nodes measure up to 10 mm short axis. Improving retroperitoneal lymphadenopathy, measuring up to 16 mm short axis. Improving splenomegaly, measuring 18.7 cm.    01/20/2016 Imaging   1. Stable exam.  No new or progressive findings. 2. No CT findings to explain odynophagia   09/02/2016 Pathology Results   The findings are consistent with involvement by previously known chronic lymphocytic leukemia   09/02/2016 Pathology Results   FISH for CLL came back positive for deletion 13q   09/15/2016 Imaging   1. Borderline enlarged abdominal and pelvic lymph nodes. Compared with 11/03/2011 these are decreased in size as detailed above. 2. Persistent splenomegaly. 3. Aortic Atherosclerosis (ICD10-I70.0). LAD coronary artery calcification noted.   10/06/2016 - 02/24/2017 Chemotherapy   He received Bendamustine and Rituxan   11/03/2016 Adverse Reaction   Dose of Bendamustine is reduced due to severe pancytopenia   12/28/2016 Imaging   1. Borderline enlarged abdominal peritoneal ligament and abdominal retroperitoneal lymph nodes, stable. 2. Splenomegaly. 3.  Aortic atherosclerosis (ICD10-170.0).   03/22/2017 PET scan   1. No hypermetabolic adenopathy identified within the neck, chest, abdomen or pelvis. 2. Prominent left retroperitoneal node measures 1.6 cm without significant FDG uptake. 3. Splenomegaly. 4. Aortic Atherosclerosis (ICD10-I70.0) and Emphysema (ICD10-J43.9). LAD and left circumflex atherosclerotic calcifications noted.   02/02/2018 Procedure   IMPRESSION: Widely patent right IJ power port catheter.   04/06/2018 Imaging   1. Interval enlargement of axillary, mediastinal, and retroperitoneal lymph nodes, as well as increased splenomegaly, findings concerning for progression of CLL in comparison to prior PET-CT dated 03/22/2017.  2.  Other chronic and incidental findings as detailed above.    04/16/2018 -  Chemotherapy   The  patient had acalabrutinib for chemo   04/17/2018 - 04/21/2018 Hospital Admission   He was hospitalized for influenza   11/29/2018 Imaging   1. Interval response to therapy. No thoracic added not scratch set no thoracic or pelvic adenopathy identified. Significant improvement in abdominal adenopathy. Largest remaining lymph node measures 1.3cm in the left retroperitoneal region. Previously 2.5 cm. 2. Persistent splenomegaly, improved from previous exam. 3. No new or progressive disease identified within the chest, abdomen or pelvis. 4. Stable appearance of 5 mm right middle lobe lung nodule. 5. Aortic Atherosclerosis (ICD10-I70.0) and Emphysema (ICD10-J43.9). Coronary artery calcifications.       REVIEW OF SYSTEMS:   Constitutional: Denies fevers, chills or abnormal weight loss Eyes: Denies blurriness of vision Ears, nose, mouth, throat, and face: Denies mucositis or sore throat Cardiovascular: Denies palpitation, chest discomfort or lower extremity swelling Gastrointestinal:  Denies nausea, heartburn or change in bowel habits Lymphatics: Denies new lymphadenopathy  Neurological:Denies numbness, tingling or new weaknesses Behavioral/Psych: Mood is stable, no new changes  All other systems were reviewed with the patient and are negative.  I have reviewed the past medical history, past surgical history, social history and family history with the patient and they are unchanged from previous note.  ALLERGIES:  is allergic to codeine.  MEDICATIONS:  Current Outpatient Medications  Medication Sig Dispense Refill  . acyclovir (ZOVIRAX) 400 MG tablet TAKE 1 TABLET BY MOUTH TWICE A DAY 180 tablet 1  . albuterol (PROVENTIL HFA) 108 (90 Base) MCG/ACT inhaler TAKE 2 PUFFS BY MOUTH EVERY 6 HOURS AS  NEEDED FOR WHEEZE OR SHORTNESS OF BREATH 18 g 11  . allopurinol (ZYLOPRIM) 300 MG tablet TAKE 1 TABLET BY MOUTH EVERY DAY 90 tablet 1  . ALPRAZolam (XANAX) 0.25 MG tablet Take 0.25 mg by mouth as needed  for anxiety.    Marland Kitchen b complex vitamins tablet Take 1 tablet by mouth daily.     . benzonatate (TESSALON) 100 MG capsule Take 2 capsules (200 mg total) by mouth 2 (two) times daily as needed for cough. 20 capsule 0  . CALQUENCE 100 MG capsule TAKE 100 MG BY MOUTH 2 (TWO) TIMES DAILY. 60 capsule 11  . Cholecalciferol (VITAMIN D-1000 MAX ST) 1000 units tablet Take 1,000 Units by mouth daily.     . famotidine (PEPCID) 20 MG tablet TAKE 1 TABLET BY MOUTH TWICE A DAY 180 tablet 3  . fluticasone (FLONASE) 50 MCG/ACT nasal spray Place 2 sprays into the nose daily.     . fluticasone furoate-vilanterol (BREO ELLIPTA) 200-25 MCG/INH AEPB Inhale 1 puff into the lungs daily. 1 each 5  . furosemide (LASIX) 20 MG tablet TAKE 1 TABLET BY MOUTH EVERY DAY AS NEEDED 90 tablet 2  . HYDROcodone-acetaminophen (NORCO) 10-325 MG tablet Take 1 tablet by mouth See admin instructions. Every 6 to 8 hours as needed for pain  0  . lidocaine-prilocaine (EMLA) cream APPLY TO AFFECTED AREA ONCE 30 g 3  . loperamide (IMODIUM) 2 MG capsule TAKE 2 CAPSULES BY MOUTH AS NEEDED DIARRHEA OR LOOSE STOOLS 60 capsule 2  . Magnesium 250 MG TABS Take 250 mg by mouth daily.    . meclizine (ANTIVERT) 12.5 MG tablet Take 12.5 mg by mouth every 6 (six) hours as needed for dizziness.   0  . metFORMIN (GLUCOPHAGE-XR) 500 MG 24 hr tablet Take 500 mg by mouth See admin instructions. Takes one tablet in AM and 2 tablets at night.    . montelukast (SINGULAIR) 10 MG tablet Take 1 tablet (10 mg total) by mouth daily. 30 tablet 5  . nitroGLYCERIN (NITROSTAT) 0.4 MG SL tablet PLACE 1 TABLET UNDER THE TONGUE EVERY 5 MINUTES X 3 DOSES AS NEEDED FOR CHEST PAIN *MAX 3 DOSES* 25 tablet 3  . nortriptyline (PAMELOR) 25 MG capsule TAKE 4 CAPSULES BY MOUTH AT BEDTIME. NEED TO SEE DOCTOR FOR REFILLS (CALL [phone]) 360 capsule 1  . pregabalin (LYRICA) 200 MG capsule Take 1 capsule (200 mg total) by mouth 3 (three) times daily. 270 capsule 1  . rosuvastatin  (CRESTOR) 5 MG tablet Take 1 tablet (5 mg total) by mouth daily. 90 tablet 3  . sildenafil (VIAGRA) 100 MG tablet Take 0.5-1 tablets (50-100 mg total) by mouth as needed for erectile dysfunction. 10 tablet 3  . zolpidem (AMBIEN) 10 MG tablet Take 10 mg by mouth at bedtime as needed for sleep.     No current facility-administered medications for this visit.   Facility-Administered Medications Ordered in Other Visits  Medication Dose Route Frequency Provider Last Rate Last Admin  . 0.9 %  sodium chloride infusion   Intravenous Continuous [name], MD 50 mL/hr at 03/07/14 1005 New Bag at 03/07/14 1005  . sodium chloride 0.9 % injection 10 mL  10 mL Intracatheter PRN Marcy Panning, MD   10 mL at 08/22/12 1721    PHYSICAL EXAMINATION: ECOG PERFORMANCE STATUS: 1 - Symptomatic but completely ambulatory  Vitals:   07/04/19 1053  BP: (!) 113/49  Pulse: 66  Resp: 18  Temp: 97.8 F (36.6 C)  SpO2: 100%  Filed Weights   07/04/19 1053  Weight: 184 lb 6.4 oz (83.6 kg)    GENERAL:alert, no distress and comfortable SKIN: The lesion on the left cheek appears to be benign appearing EYES: normal, Conjunctiva are pink and non-injected, sclera clear OROPHARYNX:no exudate, no erythema and lips, buccal mucosa, and tongue normal  NECK: supple, thyroid normal size, non-tender, without nodularity LYMPH:  no palpable lymphadenopathy in the cervical, axillary or inguinal LUNGS: Bilateral wheezes are noted HEART: regular rate & rhythm and no murmurs and no lower extremity edema ABDOMEN:abdomen soft, non-tender and normal bowel sounds Musculoskeletal:no cyanosis of digits and no clubbing  NEURO: alert & oriented x 3 with fluent speech, no focal motor/sensory deficits  LABORATORY DATA:  I have reviewed the data as listed    Component Value Date/Time   NA 142 07/04/2019 1042   NA 142 06/19/2019 0848   NA 136 01/26/2017 0940   K 4.3 07/04/2019 1042   K 4.0 01/26/2017 0940   CL 107 07/04/2019  1042   CL 104 08/03/2012 1229   CO2 27 07/04/2019 1042   CO2 23 01/26/2017 0940   GLUCOSE 93 07/04/2019 1042   GLUCOSE 146 (H) 01/26/2017 0940   GLUCOSE 223 (H) 08/03/2012 1229   BUN 9 07/04/2019 1042   BUN 13 06/19/2019 0848   BUN 9.6 01/26/2017 0940   CREATININE 0.77 07/04/2019 1042   CREATININE 1.04 04/24/2018 0821   CREATININE 1.0 01/26/2017 0940   CALCIUM 8.8 (L) 07/04/2019 1042   CALCIUM 8.7 01/26/2017 0940   PROT 5.6 (L) 07/04/2019 1042   PROT 5.9 (L) 04/13/2017 0823   PROT 5.7 (L) 01/26/2017 0940   ALBUMIN 3.6 07/04/2019 1042   ALBUMIN 3.6 01/26/2017 0940   AST 21 07/04/2019 1042   AST 13 (L) 04/24/2018 0821   AST 13 01/26/2017 0940   ALT 28 07/04/2019 1042   ALT 10 04/24/2018 0821   ALT 12 01/26/2017 0940   ALKPHOS 101 07/04/2019 1042   ALKPHOS 78 01/26/2017 0940   BILITOT 0.6 07/04/2019 1042   BILITOT 0.7 04/24/2018 0821   BILITOT 0.64 01/26/2017 0940   GFRNONAA >60 07/04/2019 1042   GFRNONAA >60 04/24/2018 0821   GFRAA >60 07/04/2019 1042   GFRAA >60 04/24/2018 0821    No results found for: SPEP, UPEP  Lab Results  Component Value Date   WBC 5.3 07/04/2019   NEUTROABS 3.8 07/04/2019   HGB 13.4 07/04/2019   HCT 40.3 07/04/2019   MCV 97.1 07/04/2019   PLT 64 (L) 07/04/2019      Chemistry      Component Value Date/Time   NA 142 07/04/2019 1042   NA 142 06/19/2019 0848   NA 136 01/26/2017 0940   K 4.3 07/04/2019 1042   K 4.0 01/26/2017 0940   CL 107 07/04/2019 1042   CL 104 08/03/2012 1229   CO2 27 07/04/2019 1042   CO2 23 01/26/2017 0940   BUN 9 07/04/2019 1042   BUN 13 06/19/2019 0848   BUN 9.6 01/26/2017 0940   CREATININE 0.77 07/04/2019 1042   CREATININE 1.04 04/24/2018 0821   CREATININE 1.0 01/26/2017 0940      Component Value Date/Time   CALCIUM 8.8 (L) 07/04/2019 1042   CALCIUM 8.7 01/26/2017 0940   ALKPHOS 101 07/04/2019 1042   ALKPHOS 78 01/26/2017 0940   AST 21 07/04/2019 1042   AST 13 (L) 04/24/2018 0821   AST 13 01/26/2017  0940   ALT 28 07/04/2019 1042   ALT 10 04/24/2018  0821   ALT 12 01/26/2017 0940   BILITOT 0.6 07/04/2019 1042   BILITOT 0.7 04/24/2018 0821   BILITOT 0.64 01/26/2017 0940

## 2019-07-05 NOTE — Assessment & Plan Note (Signed)
The mole on the left side of his cheek is benign in appearance, however, I recommend he continues active surveillance with dermatologist

## 2019-07-05 NOTE — Assessment & Plan Note (Signed)
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

## 2019-07-05 NOTE — Assessment & Plan Note (Signed)
He has significant wheezes on exam I recommend over-the-counter antihistamines as the patient appears somewhat congested He will continue to follow-up with pulmonologist for COPD management

## 2019-07-05 NOTE — Assessment & Plan Note (Signed)
His last imaging studies showed excellent response to therapy He is not in complete remission but overall no evidence of disease progression I will continue port flush see him again in about 8 weeks  Due to excellent control of his disease, I do not plan to repeat imaging study again until a few more months he will continue treatment indefinitely

## 2019-07-08 ENCOUNTER — Telehealth: Payer: Self-pay | Admitting: Hematology and Oncology

## 2019-07-08 MED FILL — CALQUENCE 100 MG CAPSULE: 100 | 30 days supply | Qty: 60 | Fill #3

## 2019-07-08 NOTE — Telephone Encounter (Signed)
Scheduled appt per 5/21 sch message - pt aware of appt scheduled.

## 2019-07-24 ENCOUNTER — Other Ambulatory Visit: Payer: Self-pay | Admitting: Nurse Practitioner

## 2019-07-24 ENCOUNTER — Other Ambulatory Visit: Payer: Self-pay | Admitting: Hematology and Oncology

## 2019-07-24 DIAGNOSIS — C9110 Chronic lymphocytic leukemia of B-cell type not having achieved remission: Secondary | ICD-10-CM

## 2019-07-24 DIAGNOSIS — E7800 Pure hypercholesterolemia, unspecified: Secondary | ICD-10-CM

## 2019-07-24 DIAGNOSIS — I2510 Atherosclerotic heart disease of native coronary artery without angina pectoris: Secondary | ICD-10-CM

## 2019-08-02 ENCOUNTER — Encounter: Payer: Self-pay | Admitting: Hematology and Oncology

## 2019-08-05 ENCOUNTER — Other Ambulatory Visit: Payer: Self-pay | Admitting: *Deleted

## 2019-08-05 DIAGNOSIS — I739 Peripheral vascular disease, unspecified: Secondary | ICD-10-CM

## 2019-08-05 MED FILL — CALQUENCE 100 MG CAPSULE: 100 | 30 days supply | Qty: 60 | Fill #4

## 2019-08-06 ENCOUNTER — Telehealth: Payer: Self-pay | Admitting: Neurology

## 2019-08-06 ENCOUNTER — Ambulatory Visit (INDEPENDENT_AMBULATORY_CARE_PROVIDER_SITE_OTHER): Payer: Medicare Other | Admitting: Neurology

## 2019-08-06 ENCOUNTER — Encounter: Payer: Self-pay | Admitting: Neurology

## 2019-08-06 ENCOUNTER — Other Ambulatory Visit: Payer: Self-pay

## 2019-08-06 VITALS — BP 100/57 | HR 67 | Ht 72.0 in | Wt 180.0 lb

## 2019-08-06 DIAGNOSIS — G629 Polyneuropathy, unspecified: Secondary | ICD-10-CM | POA: Diagnosis not present

## 2019-08-06 MED ORDER — LEVETIRACETAM 250 MG PO TABS: 250.0000 mg | ORAL_TABLET | Freq: Two times a day (BID) | ORAL | 5 refills | Status: DC

## 2019-08-06 NOTE — Progress Notes (Signed)
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, chronic lymphocytic leukemia, no evidence of solid organ involvement, he received chemotherapy FCR x6 cycles from May 2014 to October 2014, chemotherapyBendamustineand and rituximab from October 06, 2016 to February 24, 2017,  Most recent CAT scan on March 22, 3152, no hypermetabolic adenopathy identified within the neck, chest, abdomen or pelvic, prominent left retroperitoneal lymph node measuring 1.6 cm without significant FDG uptake, splenomegaly,  He has diabetes since 2012, still works as an Chief Financial Officer,  Hisdiabetic symptoms started around 2009, he noticed bilateral feet numbness tingling, at the bottom of his feet, actually underwent bilateral tarsal tunnel release surgery did not help his symptoms, reported abnormal EMG nerve conduction study in the past from outside office, never had a repeat study in the laboratory,  Reviewing the record, previously had evidence of vitamin B12, has been on vitamin B12 IM supplement since,  He has tried and failed gabapentin, Cymbalta, Topamax, currently taking Lyrica 100 mg 3 times a day +200 mg at bedtime, and gabapentin 300 mg twice a day.  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  For the last year he has been on Medicare after retirement.  He has been prescribed Lyrica 200 mg capsule, 3 times daily, and 2 at night.  He says Medicare will not pay for this dose, his  commercial insurance did prior.  He has been taking Lyrica 200 mg once daily.  He indicates he has continued pain in his feet, from his toes to his midfoot.  He sleeps well at night, he has remained active, but reports if he were to be working now, he would not be able to due to pain.  He denies any falls.  He has history of CLL, reports has been overall stable, no evidence of disease progression.  Has follow-up with Dr. Alvy Bimler.  He is also taking nortriptyline 100 mg at bedtime. 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   Update August 06, 2019 SS: Here today for follow-up of neuropathy, remains on Lyrica 200 mg 3 times a day, stopped nortriptyline-indicates did not see much benefit.  Has chronic burning/aching in his toes up to mid foot.  Has been stable since 2012.  Does not keep him from doing anything, symptoms are throughout the day.  He seems to sleep well.  No recent falls.  He remains active.  Has history of CLL that has remained stable.  Diabetes is under good control.  Is on oral B12. Indicates after visit is over, would like to discuss dizziness with movement for last several months, tremor to both hands affecting handwriting x 3 years.   REVIEW OF SYSTEMS: Out of a complete 14 system review of symptoms, the patient complains only of the following symptoms, and all other reviewed systems are negative.  Burning  ALLERGIES: Allergies  Allergen Reactions  . Codeine Hives    Pt states he can take a few, more reaction with extended doses.    HOME MEDICATIONS: Outpatient Medications Prior to  Visit  Medication Sig Dispense Refill  . acyclovir (ZOVIRAX) 400 MG tablet TAKE 1 TABLET BY MOUTH TWICE A DAY 180 tablet 1  . albuterol (PROVENTIL HFA) 108 (90 Base) MCG/ACT inhaler TAKE 2 PUFFS BY MOUTH EVERY 6 HOURS AS NEEDED FOR WHEEZE OR SHORTNESS OF BREATH 18 g 11  . allopurinol (ZYLOPRIM) 300 MG tablet TAKE 1 TABLET BY MOUTH EVERY DAY 90 tablet 1  . ALPRAZolam (XANAX) 0.25 MG tablet Take 0.5  mg by mouth as needed for anxiety.     Marland Kitchen b complex vitamins tablet Take 1 tablet by mouth daily.     . benzonatate (TESSALON) 100 MG capsule Take 2 capsules (200 mg total) by mouth 2 (two) times daily as needed for cough. 20 capsule 0  . CALQUENCE 100 MG capsule TAKE 100 MG BY MOUTH 2 (TWO) TIMES DAILY. 60 capsule 11  . Cholecalciferol (VITAMIN D-1000 MAX ST) 1000 units tablet Take 1,000 Units by mouth daily.     . famotidine (PEPCID) 20 MG tablet TAKE 1 TABLET BY MOUTH TWICE A DAY 180 tablet 3  . fluticasone (FLONASE) 50 MCG/ACT nasal spray Place 2 sprays into the nose daily.     . fluticasone furoate-vilanterol (BREO ELLIPTA) 200-25 MCG/INH AEPB Inhale 1 puff into the lungs daily. 1 each 5  . furosemide (LASIX) 20 MG tablet TAKE 1 TABLET BY MOUTH EVERY DAY AS NEEDED 90 tablet 2  . HYDROcodone-acetaminophen (NORCO) 10-325 MG tablet Take 1 tablet by mouth See admin instructions. Every 6 to 8 hours as needed for pain  0  . lidocaine-prilocaine (EMLA) cream APPLY TO AFFECTED AREA ONCE 30 g 3  . loperamide (IMODIUM) 2 MG capsule TAKE 2 CAPSULES BY MOUTH AS NEEDED DIARRHEA OR LOOSE STOOLS 60 capsule 2  . meclizine (ANTIVERT) 12.5 MG tablet Take 12.5 mg by mouth every 6 (six) hours as needed for dizziness.   0  . metFORMIN (GLUCOPHAGE-XR) 500 MG 24 hr tablet Take 500 mg by mouth See admin instructions. Takes one tablet in AM and 2 tablets at night.    . montelukast (SINGULAIR) 10 MG tablet Take 1 tablet (10 mg total) by mouth daily. 30 tablet 5  . nitroGLYCERIN (NITROSTAT) 0.4 MG SL tablet PLACE 1 TABLET UNDER THE TONGUE EVERY 5 MINUTES X 3 DOSES AS NEEDED FOR CHEST PAIN *MAX 3 DOSES* 25 tablet 3  . pregabalin (LYRICA) 200 MG capsule Take 1 capsule (200 mg total) by mouth 3 (three) times daily. 270 capsule 1  . rosuvastatin (CRESTOR) 5 MG tablet TAKE 1 TABLET BY MOUTH EVERY DAY 90 tablet 3  . sildenafil (VIAGRA) 100 MG tablet Take 0.5-1 tablets (50-100 mg total) by mouth as needed for erectile  dysfunction. 10 tablet 3  . zolpidem (AMBIEN) 10 MG tablet Take 10 mg by mouth at bedtime as needed for sleep.    . nortriptyline (PAMELOR) 25 MG capsule TAKE 4 CAPSULES BY MOUTH AT BEDTIME. NEED TO SEE DOCTOR FOR REFILLS (CALL [phone]) 360 capsule 1  . Magnesium 250 MG TABS Take 250 mg by mouth daily.     Facility-Administered Medications Prior to Visit  Medication Dose Route Frequency Provider Last Rate Last Admin  . 0.9 %  sodium chloride infusion   Intravenous Continuous [name], MD 50 mL/hr at 03/07/14 1005 New Bag at 03/07/14 1005  . sodium chloride 0.9 % injection 10 mL  10 mL Intracatheter PRN Marcy Panning, MD   10 mL at 08/22/12 1721    PAST MEDICAL HISTORY: Past Medical  History:  Diagnosis Date  . Anxiety 01/30/2014  . Back injury    lower disc  . CAD (coronary artery disease)   . CLL (chronic lymphocytic leukemia) ([place]) 03/18/2011  . Diabetes mellitus ([place])    Type 2   . ECRB (extensor carpi radialis brevis) tenosynovitis   . GERD (gastroesophageal reflux disease)    takes Nexium if needed  . Hyperlipidemia   . Lateral epicondylitis of left elbow   . MI, acute, non ST segment elevation ([place]) 06/28/2009   with stenting of the LAD  . Neuromuscular disorder ([place])    peripheral neuropathy  . PVD (peripheral vascular disease) ([place])   . Tobacco abuse     PAST SURGICAL HISTORY: Past Surgical History:  Procedure Laterality Date  . ADENOIDECTOMY  1955  . CARDIAC CATHETERIZATION    . CARDIAC CATHETERIZATION N/A 09/26/2014   Procedure: Left Heart Cath and Coronary Angiography;  Surgeon: Peter M Martinique, MD;  Location: [facility];  Service: Cardiovascular;  Laterality: N/A;  . carpel tunnel release Left 04-1989  . carpel tunnel release  Right 01-1989  . CHOLECYSTECTOMY  2007  . CORONARY STENT PLACEMENT  May 2011  . femoral stents    . IR CV LINE INJECTION  08/18/2017  . IR CV LINE INJECTION  09/01/2017  . IR CV LINE INJECTION  02/02/2018  . LATERAL EPICONDYLE  RELEASE Left 02/12/2014   Procedure: LEFT ELBOW DEBRIDEMENT WITH TENDON REPAIR ;  Surgeon: Lorn Junes, MD;  Location: [facility];  Service: Orthopedics;  Laterality: Left;  . LEFT CAI STENT/PTA AND POPLITEAL ARTERY/TIBIAL THROMBECTOMY     . LEFT HEART CATHETERIZATION WITH CORONARY ANGIOGRAM N/A 08/26/2011   Procedure: LEFT HEART CATHETERIZATION WITH CORONARY ANGIOGRAM;  Surgeon: Peter M Martinique, MD;  Location: [facility];  Service: Cardiovascular;  Laterality: N/A;  . PERIPHERAL VASCULAR CATHETERIZATION N/A 01/01/2015   Procedure: Abdominal Aortogram;  Surgeon: Conrad Cranberry Lake, MD;  Location: [facility];  Service: Cardiovascular;  Laterality: N/A;  . TARSAL TUNNEL RELEASE Bilateral 08-2007    FAMILY HISTORY: Family History  Problem Relation Age of Onset  . Lung cancer Mother 77  . Cancer Mother        lung  . Heart failure Father 22  . Heart disease Father     SOCIAL HISTORY: Social History   Socioeconomic History  . Marital status: Married    Spouse name: [name]  . Number of children: 1  . Years of education: College  . Highest education level: Not on file  Occupational History    Employer: [employer]    Comment: [employer]  Tobacco Use  . Smoking status: Former Smoker    Packs/day: 1.00    Years: 44.00    Pack years: 44.00    Types: Cigarettes    Quit date: 04/23/2016    Years since quitting: 3.2  . Smokeless tobacco: Former Network engineer  . Vaping Use: Never used  Substance and Sexual Activity  . Alcohol use: No    Alcohol/week: 0.0 standard drinks    Comment:  drinks non-alcoholic beer  . Drug use: No  . Sexual activity: Yes  Other Topics Concern  . Not on file  Social History Narrative   Patient lives at home with wife.   Caffeine Use: 15 cups of caffeine weekly   Social Determinants of Health   Financial Resource Strain:   . Difficulty of Paying Living Expenses:   Food Insecurity:   .  Worried About Charity fundraiser in the Last  Year:   . Arboriculturist in the Last Year:   Transportation Needs:   . Film/video editor (Medical):   Marland Kitchen Lack of Transportation (Non-Medical):   Physical Activity:   . Days of Exercise per Week:   . Minutes of Exercise per Session:   Stress:   . Feeling of Stress :   Social Connections:   . Frequency of Communication with Friends and Family:   . Frequency of Social Gatherings with Friends and Family:   . Attends Religious Services:   . Active Member of Clubs or Organizations:   . Attends Archivist Meetings:   Marland Kitchen Marital Status:   Intimate Partner Violence:   . Fear of Current or Ex-Partner:   . Emotionally Abused:   Marland Kitchen Physically Abused:   . Sexually Abused:   PHYSICAL EXAM  Vitals:   08/06/19 0936  BP: (!) 100/57  Pulse: 67  Weight: 180 lb (81.6 kg)  Height: 6' (1.829 m)   Body mass index is 24.41 kg/m.  Generalized: Well developed, in no acute distress   Neurological examination  Mentation: Alert oriented to time, place, history taking. Follows all commands speech and language fluent Cranial nerve II-XII: Pupils were equal round reactive to light. Extraocular movements were full, visual field were full on confrontational test. Facial sensation and strength were normal. Head turning and shoulder shrug were normal and symmetric. Motor: The motor testing reveals 5 over 5 strength of all 4 extremities. Good symmetric motor tone is noted throughout.  Sensory: Decreased sensation to pinprick up to mid shin, vibration sensation is intact Coordination: Cerebellar testing reveals good finger-nose-finger and heel-to-shin bilaterally.  Gait and station: Gait is slightly wide-based, but steady, no assistive device.  Able to walk on heels and tiptoes. Reflexes: Deep tendon reflexes are symmetric and normal bilaterally.   DIAGNOSTIC DATA (LABS, IMAGING, TESTING) - I reviewed patient records, labs, notes, testing and imaging myself where available.  Lab Results    Component Value Date   WBC 5.3 07/04/2019   HGB 13.4 07/04/2019   HCT 40.3 07/04/2019   MCV 97.1 07/04/2019   PLT 64 (L) 07/04/2019      Component Value Date/Time   NA 142 07/04/2019 1042   NA 142 06/19/2019 0848   NA 136 01/26/2017 0940   K 4.3 07/04/2019 1042   K 4.0 01/26/2017 0940   CL 107 07/04/2019 1042   CL 104 08/03/2012 1229   CO2 27 07/04/2019 1042   CO2 23 01/26/2017 0940   GLUCOSE 93 07/04/2019 1042   GLUCOSE 146 (H) 01/26/2017 0940   GLUCOSE 223 (H) 08/03/2012 1229   BUN 9 07/04/2019 1042   BUN 13 06/19/2019 0848   BUN 9.6 01/26/2017 0940   CREATININE 0.77 07/04/2019 1042   CREATININE 1.04 04/24/2018 0821   CREATININE 1.0 01/26/2017 0940   CALCIUM 8.8 (L) 07/04/2019 1042   CALCIUM 8.7 01/26/2017 0940   PROT 5.6 (L) 07/04/2019 1042   PROT 5.9 (L) 04/13/2017 0823   PROT 5.7 (L) 01/26/2017 0940   ALBUMIN 3.6 07/04/2019 1042   ALBUMIN 3.6 01/26/2017 0940   AST 21 07/04/2019 1042   AST 13 (L) 04/24/2018 0821   AST 13 01/26/2017 0940   ALT 28 07/04/2019 1042   ALT 10 04/24/2018 0821   ALT 12 01/26/2017 0940   ALKPHOS 101 07/04/2019 1042   ALKPHOS 78 01/26/2017 0940   BILITOT 0.6 07/04/2019 1042  BILITOT 0.7 04/24/2018 0821   BILITOT 0.64 01/26/2017 0940   GFRNONAA >60 07/04/2019 1042   GFRNONAA >60 04/24/2018 0821   GFRAA >60 07/04/2019 1042   GFRAA >60 04/24/2018 0821   Lab Results  Component Value Date   CHOL 79 (L) 06/19/2019   HDL 34 (L) 06/19/2019   LDLCALC 33 06/19/2019   LDLDIRECT 117.6 08/22/2011   TRIG 44 06/19/2019   CHOLHDL 2.3 06/19/2019   Lab Results  Component Value Date   HGBA1C 6.2 (H) 04/13/2017   Lab Results  Component Value Date   RXVQMGQQ76 195 04/13/2017   Lab Results  Component Value Date   TSH 1.620 04/13/2017      ASSESSMENT AND PLAN 69 y.o. year old male  has a past medical history of Anxiety (01/30/2014), Back injury, CAD (coronary artery disease), CLL (chronic lymphocytic leukemia) ([place]) (03/18/2011),  Diabetes mellitus ([place]), ECRB (extensor carpi radialis brevis) tenosynovitis, GERD (gastroesophageal reflux disease), Hyperlipidemia, Lateral epicondylitis of left elbow, MI, acute, non ST segment elevation ([place]) (06/28/2009), Neuromuscular disorder ([place]), PVD (peripheral vascular disease) ([place]), and Tobacco abuse. here with:  1.  Peripheral neuropathy -Chronic burning/aching in toes/mid foot, overall stable  -Continue Lyrica 200 mg 3 times a day, at one point was taking extra 2 tablets at bedtime, really didn't notice much difference on lower dose now, will stay at current dosage (will be due for refill soon, wants pharmacy to request it) -Will stay off nortriptyline due to lack of benefit -Try Keppra 250 mg twice a day for neuropathy symptoms -Patient mentions after examination and in closure of visit, wants to discuss his dizziness x several months, tremor x3 years in bilateral hands, will get the patient set up for new problem visit with Dr. Krista Blue, offered MRI of the brain in the interim, patient declined  I spent 30 minutes of face-to-face and non-face-to-face time with patient.  This included previsit chart review, lab review, study review, order entry, electronic health record documentation, patient education.  Butler Denmark, AGNP-C, DNP 08/06/2019, 10:16 AM [facility] [address] [place], [place] [address] [phone]

## 2019-08-06 NOTE — Patient Instructions (Addendum)
Continue Lyrica at current dose  Try Keppra 250 mg twice daily Stop the nortriptyline  Follow up with Dr. Krista Blue for new problems Levetiracetam tablets What is this medicine? LEVETIRACETAM (lee ve tye RA se tam) is an antiepileptic drug. It is used with other medicines to treat certain types of seizures. This medicine may be used for other purposes; ask your health care provider or pharmacist if you have questions. COMMON BRAND NAME(S): Keppra, Roweepra What should I tell my health care provider before I take this medicine? They need to know if you have any of these conditions:  kidney disease  suicidal thoughts, plans, or attempt; a previous suicide attempt by you or a family member  an unusual or allergic reaction to levetiracetam, other medicines, foods, dyes, or preservatives  pregnant or trying to get pregnant  breast-feeding How should I use this medicine? Take this medicine by mouth with a glass of water. Follow the directions on the prescription label. Swallow the tablets whole. Do not crush or chew this medicine. You may take this medicine with or without food. Take your doses at regular intervals. Do not take your medicine more often than directed. Do not stop taking this medicine or any of your seizure medicines unless instructed by your doctor or health care professional. Stopping your medicine suddenly can increase your seizures or their severity. A special MedGuide will be given to you by the pharmacist with each prescription and refill. Be sure to read this information carefully each time. Contact your pediatrician or health care professional regarding the use of this medication in children. While this drug may be prescribed for children as young as 71 years of age for selected conditions, precautions do apply. Overdosage: If you think you have taken too much of this medicine contact a poison control center or emergency room at once. NOTE: This medicine is only for you. Do not  share this medicine with others. What if I miss a dose? If you miss a dose, take it as soon as you can. If it is almost time for your next dose, take only that dose. Do not take double or extra doses. What may interact with this medicine? This medicine may interact with the following medications:  carbamazepine  colesevelam  probenecid  sevelamer This list may not describe all possible interactions. Give your health care provider a list of all the medicines, herbs, non-prescription drugs, or dietary supplements you use. Also tell them if you smoke, drink alcohol, or use illegal drugs. Some items may interact with your medicine. What should I watch for while using this medicine? Visit your doctor or health care provider for a regular check on your progress. Wear a medical identification bracelet or chain to say you have epilepsy, and carry a card that lists all your medications. This medicine may cause serious skin reactions. They can happen weeks to months after starting the medicine. Contact your health care provider right away if you notice fevers or flu-like symptoms with a rash. The rash may be red or purple and then turn into blisters or peeling of the skin. Or, you might notice a red rash with swelling of the face, lips or lymph nodes in your neck or under your arms. It is important to take this medicine exactly as instructed by your health care provider. When first starting treatment, your dose may need to be adjusted. It may take weeks or months before your dose is stable. You should contact your doctor or health care  provider if your seizures get worse or if you have any new types of seizures. You may get drowsy or dizzy. Do not drive, use machinery, or do anything that needs mental alertness until you know how this medicine affects you. Do not stand or sit up quickly, especially if you are an older patient. This reduces the risk of dizzy or fainting spells. Alcohol may interfere with the  effect of this medicine. Avoid alcoholic drinks. The use of this medicine may increase the chance of suicidal thoughts or actions. Pay special attention to how you are responding while on this medicine. Any worsening of mood, or thoughts of suicide or dying should be reported to your health care provider right away. Women who become pregnant while using this medicine may enroll in the [place] Pregnancy Registry by calling [phone]. This registry collects information about the safety of antiepileptic drug use during pregnancy. What side effects may I notice from receiving this medicine? Side effects that you should report to your doctor or health care professional as soon as possible:  allergic reactions like skin rash, itching or hives, swelling of the face, lips, or tongue  breathing problems  dark urine  general ill feeling or flu-like symptoms  problems with balance, talking, walking  rash, fever, and swollen lymph nodes  redness, blistering, peeling or loosening of the skin, including inside the mouth  unusually weak or tired  worsening of mood, thoughts or actions of suicide or dying  yellowing of the eyes or skin Side effects that usually do not require medical attention (report to your doctor or health care professional if they continue or are bothersome):  diarrhea  dizzy, drowsy  headache  loss of appetite This list may not describe all possible side effects. Call your doctor for medical advice about side effects. You may report side effects to FDA at 1-800-FDA-1088. Where should I keep my medicine? Keep out of reach of children. Store at room temperature between 15 and 30 degrees C (59 and 86 degrees F). Throw away any unused medicine after the expiration date. NOTE: This sheet is a summary. It may not cover all possible information. If you have questions about this medicine, talk to your doctor, pharmacist, or health care provider.   2020 Elsevier/Gold Standard (2018-05-04 15:23:36)

## 2019-08-06 NOTE — Telephone Encounter (Signed)
Will you please get Mr. Barretta set up for new problem visit with Dr.Yan, for dizziness x several months, tremor to both hands x 3 years.

## 2019-08-12 ENCOUNTER — Other Ambulatory Visit: Payer: Self-pay | Admitting: Neurology

## 2019-08-16 ENCOUNTER — Ambulatory Visit (HOSPITAL_COMMUNITY)
Admission: RE | Admit: 2019-08-16 | Discharge: 2019-08-16 | Disposition: A | Discharge status: Home / Self Care | Payer: Medicare Other | Source: Ambulatory Visit | Attending: Vascular Surgery | Admitting: Vascular Surgery

## 2019-08-16 ENCOUNTER — Other Ambulatory Visit: Payer: Self-pay

## 2019-08-16 ENCOUNTER — Ambulatory Visit (INDEPENDENT_AMBULATORY_CARE_PROVIDER_SITE_OTHER): Payer: Medicare Other | Admitting: Vascular Surgery

## 2019-08-16 ENCOUNTER — Encounter: Payer: Self-pay | Admitting: Vascular Surgery

## 2019-08-16 VITALS — BP 99/62 | HR 63 | Temp 97.8°F | Resp 20 | Ht 72.0 in | Wt 179.0 lb

## 2019-08-16 DIAGNOSIS — I739 Peripheral vascular disease, unspecified: Secondary | ICD-10-CM

## 2019-08-16 NOTE — Progress Notes (Signed)
Patient ID: Ryan Copeland, male   DOB: 01-03-1951, 69 y.o.   MRN: [id number]  Reason for Consult: New Patient (Initial Visit)   Referred by [name]., PA-C  Subjective:     HPI:  Ryan Copeland is a 69 y.o. male history of balloon expandable stent on the left common iliac artery back in 0539 complicated by left lower extremity embolization requiring tibial embolectomy.  He did require repeat balloon angioplasty in 2010.  He now has some repeat pain in his bilateral lower extremities in the anterior shins.  He does not have any limitation to his walking.  He has quit smoking.  He has lost significant weight.  He remains on a statin drug.  Chief complaint today is actually shortness of breath.  Past Medical History:  Diagnosis Date  . Anxiety 01/30/2014  . Back injury    lower disc  . CAD (coronary artery disease)   . CLL (chronic lymphocytic leukemia) ([place]) 03/18/2011  . COPD (chronic obstructive pulmonary disease) ([place])   . Diabetes mellitus ([place])    Type 2   . ECRB (extensor carpi radialis brevis) tenosynovitis   . GERD (gastroesophageal reflux disease)    takes Nexium if needed  . Hyperlipidemia   . Lateral epicondylitis of left elbow   . MI, acute, non ST segment elevation ([place]) 06/28/2009   with stenting of the LAD  . Neuromuscular disorder ([place])    peripheral neuropathy  . PVD (peripheral vascular disease) ([place])   . Tobacco abuse    Family History  Problem Relation Age of Onset  . Lung cancer Mother 67  . Cancer Mother        lung  . Heart failure Father 91  . Heart disease Father    Past Surgical History:  Procedure Laterality Date  . ADENOIDECTOMY  1955  . CARDIAC CATHETERIZATION    . CARDIAC CATHETERIZATION N/A 09/26/2014   Procedure: Left Heart Cath and Coronary Angiography;  Surgeon: Peter M Martinique, MD;  Location: [facility];  Service: Cardiovascular;  Laterality: N/A;  . carpel tunnel release Left 04-1989  . carpel tunnel release  Right  01-1989  . CHOLECYSTECTOMY  2007  . CORONARY STENT PLACEMENT  May 2011  . femoral stents    . IR CV LINE INJECTION  08/18/2017  . IR CV LINE INJECTION  09/01/2017  . IR CV LINE INJECTION  02/02/2018  . LATERAL EPICONDYLE RELEASE Left 02/12/2014   Procedure: LEFT ELBOW DEBRIDEMENT WITH TENDON REPAIR ;  Surgeon: Lorn Junes, MD;  Location: [facility];  Service: Orthopedics;  Laterality: Left;  . LEFT CAI STENT/PTA AND POPLITEAL ARTERY/TIBIAL THROMBECTOMY     . LEFT HEART CATHETERIZATION WITH CORONARY ANGIOGRAM N/A 08/26/2011   Procedure: LEFT HEART CATHETERIZATION WITH CORONARY ANGIOGRAM;  Surgeon: Peter M Martinique, MD;  Location: [facility];  Service: Cardiovascular;  Laterality: N/A;  . PERIPHERAL VASCULAR CATHETERIZATION N/A 01/01/2015   Procedure: Abdominal Aortogram;  Surgeon: Conrad Heimdal, MD;  Location: [facility];  Service: Cardiovascular;  Laterality: N/A;  . TARSAL TUNNEL RELEASE Bilateral 08-2007    Short Social History:  Social History   Tobacco Use  . Smoking status: Former Smoker    Packs/day: 1.00    Years: 44.00    Pack years: 44.00    Types: Cigarettes    Quit date: 04/23/2016    Years since quitting: 3.3  . Smokeless tobacco: Former Network engineer Use Topics  . Alcohol use:  No    Alcohol/week: 0.0 standard drinks    Comment:  drinks non-alcoholic beer    Allergies  Allergen Reactions  . Codeine Hives    Pt states he can take a few, more reaction with extended doses.    Current Outpatient Medications  Medication Sig Dispense Refill  . acyclovir (ZOVIRAX) 400 MG tablet TAKE 1 TABLET BY MOUTH TWICE A DAY 180 tablet 1  . albuterol (PROVENTIL HFA) 108 (90 Base) MCG/ACT inhaler TAKE 2 PUFFS BY MOUTH EVERY 6 HOURS AS NEEDED FOR WHEEZE OR SHORTNESS OF BREATH 18 g 11  . allopurinol (ZYLOPRIM) 300 MG tablet TAKE 1 TABLET BY MOUTH EVERY DAY 90 tablet 1  . ALPRAZolam (XANAX) 0.25 MG tablet Take 0.5 mg by mouth as needed for anxiety.     Marland Kitchen b complex vitamins tablet  Take 1 tablet by mouth daily.     . benzonatate (TESSALON) 100 MG capsule Take 2 capsules (200 mg total) by mouth 2 (two) times daily as needed for cough. 20 capsule 0  . CALQUENCE 100 MG capsule TAKE 100 MG BY MOUTH 2 (TWO) TIMES DAILY. 60 capsule 11  . Cholecalciferol (VITAMIN D-1000 MAX ST) 1000 units tablet Take 1,000 Units by mouth daily.     . famotidine (PEPCID) 20 MG tablet TAKE 1 TABLET BY MOUTH TWICE A DAY 180 tablet 3  . fluticasone (FLONASE) 50 MCG/ACT nasal spray Place 2 sprays into the nose daily.     . fluticasone furoate-vilanterol (BREO ELLIPTA) 200-25 MCG/INH AEPB Inhale 1 puff into the lungs daily. 1 each 5  . furosemide (LASIX) 20 MG tablet TAKE 1 TABLET BY MOUTH EVERY DAY AS NEEDED 90 tablet 2  . HYDROcodone-acetaminophen (NORCO) 10-325 MG tablet Take 1 tablet by mouth See admin instructions. Every 6 to 8 hours as needed for pain  0  . levETIRAcetam (KEPPRA) 250 MG tablet Take 1 tablet (250 mg total) by mouth 2 (two) times daily. 60 tablet 5  . lidocaine-prilocaine (EMLA) cream APPLY TO AFFECTED AREA ONCE 30 g 3  . loperamide (IMODIUM) 2 MG capsule TAKE 2 CAPSULES BY MOUTH AS NEEDED DIARRHEA OR LOOSE STOOLS 60 capsule 2  . meclizine (ANTIVERT) 12.5 MG tablet Take 12.5 mg by mouth every 6 (six) hours as needed for dizziness.   0  . metFORMIN (GLUCOPHAGE-XR) 500 MG 24 hr tablet Take 500 mg by mouth See admin instructions. Takes one tablet in AM and 2 tablets at night.    . montelukast (SINGULAIR) 10 MG tablet Take 1 tablet (10 mg total) by mouth daily. 30 tablet 5  . nitroGLYCERIN (NITROSTAT) 0.4 MG SL tablet PLACE 1 TABLET UNDER THE TONGUE EVERY 5 MINUTES X 3 DOSES AS NEEDED FOR CHEST PAIN *MAX 3 DOSES* 25 tablet 3  . pregabalin (LYRICA) 200 MG capsule Take 1 capsule (200 mg total) by mouth 3 (three) times daily. 270 capsule 1  . rosuvastatin (CRESTOR) 5 MG tablet TAKE 1 TABLET BY MOUTH EVERY DAY 90 tablet 3  . sildenafil (VIAGRA) 100 MG tablet Take 0.5-1 tablets (50-100 mg  total) by mouth as needed for erectile dysfunction. 10 tablet 3  . zolpidem (AMBIEN) 10 MG tablet Take 10 mg by mouth at bedtime as needed for sleep.     No current facility-administered medications for this visit.   Facility-Administered Medications Ordered in Other Visits  Medication Dose Route Frequency Provider Last Rate Last Admin  . 0.9 %  sodium chloride infusion   Intravenous Continuous [name], MD 50 mL/hr  at 03/07/14 1005 New Bag at 03/07/14 1005  . sodium chloride 0.9 % injection 10 mL  10 mL Intracatheter PRN Marcy Panning, MD   10 mL at 08/22/12 1721    Review of Systems  Constitutional:  Constitutional negative. HENT: HENT negative.  Eyes: Eyes negative.  Respiratory: Positive for shortness of breath and wheezing.  Musculoskeletal: Positive for leg pain.  Skin: Skin negative.  Neurological: Positive for dizziness.  Hematologic: Positive for bruises/bleeds easily.  Psychiatric: Psychiatric negative.        Objective:  Objective   Vitals:   08/16/19 1221  BP: 99/62  Pulse: 63  Resp: 20  Temp: 97.8 F (36.6 C)  SpO2: 96%    Physical Exam HENT:     Head: Normocephalic.     Nose:     Comments: Wearing a mask Eyes:     Pupils: Pupils are equal, round, and reactive to light.  Neck:     Vascular: No carotid bruit.  Cardiovascular:     Rate and Rhythm: Normal rate and regular rhythm.     Pulses:          Radial pulses are 0 on the right side and 2+ on the left side.       Femoral pulses are 2+ on the right side and 2+ on the left side.      Popliteal pulses are 2+ on the right side and 2+ on the left side.  Pulmonary:     Effort: Pulmonary effort is normal.  Abdominal:     General: Abdomen is flat.     Palpations: Abdomen is soft.  Musculoskeletal:     Cervical back: Normal range of motion and neck supple.     Right lower leg: No edema.     Left lower leg: No edema.  Skin:    General: Skin is warm.     Capillary Refill: Capillary refill takes  less than 2 seconds.  Neurological:     General: No focal deficit present.     Mental Status: He is alert.  Psychiatric:        Mood and Affect: Mood normal.        Behavior: Behavior normal.        Thought Content: Thought content normal.        Judgment: Judgment normal.     Data: Independently interpreted his ABIs to be 1 with toe pressure on the right 107 left 103 biphasic bilaterally.     Assessment/Plan:     69 year old male with history of the left common iliac artery stent and left lower extremity thromboembolectomy for embolization.  He had subsequent balloon angioplasty of the stent for in-stent stenosis.  He has bilateral lower extremity pain this does not appear to be vascular nature he has bilateral common femoral pulses.  At this point stent in the left common iliac artery appears to be doing well.  We will get him follow-up in 1 year with repeat ABIs we will also check carotid duplexes at that time.     Waynetta Sandy MD [facility]

## 2019-08-30 ENCOUNTER — Inpatient Hospital Stay: Payer: Medicare Other | Attending: Hematology and Oncology | Admitting: Hematology and Oncology

## 2019-08-30 ENCOUNTER — Inpatient Hospital Stay: Payer: Medicare Other

## 2019-08-30 ENCOUNTER — Encounter: Payer: Self-pay | Admitting: Hematology and Oncology

## 2019-08-30 ENCOUNTER — Other Ambulatory Visit: Payer: Self-pay

## 2019-08-30 DIAGNOSIS — I259 Chronic ischemic heart disease, unspecified: Secondary | ICD-10-CM | POA: Diagnosis not present

## 2019-08-30 DIAGNOSIS — C8308 Small cell B-cell lymphoma, lymph nodes of multiple sites: Secondary | ICD-10-CM

## 2019-08-30 DIAGNOSIS — C9110 Chronic lymphocytic leukemia of B-cell type not having achieved remission: Secondary | ICD-10-CM | POA: Diagnosis present

## 2019-08-30 DIAGNOSIS — I2510 Atherosclerotic heart disease of native coronary artery without angina pectoris: Secondary | ICD-10-CM | POA: Diagnosis not present

## 2019-08-30 DIAGNOSIS — D696 Thrombocytopenia, unspecified: Secondary | ICD-10-CM | POA: Diagnosis not present

## 2019-08-30 DIAGNOSIS — Z7984 Long term (current) use of oral hypoglycemic drugs: Secondary | ICD-10-CM | POA: Diagnosis not present

## 2019-08-30 DIAGNOSIS — Z79899 Other long term (current) drug therapy: Secondary | ICD-10-CM | POA: Diagnosis not present

## 2019-08-30 DIAGNOSIS — R197 Diarrhea, unspecified: Secondary | ICD-10-CM | POA: Diagnosis not present

## 2019-08-30 DIAGNOSIS — Z95828 Presence of other vascular implants and grafts: Secondary | ICD-10-CM

## 2019-08-30 DIAGNOSIS — Z7951 Long term (current) use of inhaled steroids: Secondary | ICD-10-CM | POA: Insufficient documentation

## 2019-08-30 DIAGNOSIS — J449 Chronic obstructive pulmonary disease, unspecified: Secondary | ICD-10-CM

## 2019-08-30 DIAGNOSIS — D801 Nonfamilial hypogammaglobulinemia: Secondary | ICD-10-CM

## 2019-08-30 LAB — CBC WITH DIFFERENTIAL/PLATELET
Abs Immature Granulocytes: 0.23 10*3/uL — ABNORMAL HIGH (ref 0.00–0.07)
Basophils Absolute: 0 10*3/uL (ref 0.0–0.1)
Basophils Relative: 0 %
Eosinophils Absolute: 0 10*3/uL (ref 0.0–0.5)
Eosinophils Relative: 0 %
HCT: 41 % (ref 39.0–52.0)
Hemoglobin: 13.6 g/dL (ref 13.0–17.0)
Immature Granulocytes: 4 %
Lymphocytes Relative: 10 %
Lymphs Abs: 0.6 10*3/uL — ABNORMAL LOW (ref 0.7–4.0)
MCH: 31.9 pg (ref 26.0–34.0)
MCHC: 33.2 g/dL (ref 30.0–36.0)
MCV: 96 fL (ref 80.0–100.0)
Monocytes Absolute: 0.4 10*3/uL (ref 0.1–1.0)
Monocytes Relative: 6 %
Neutro Abs: 4.8 10*3/uL (ref 1.7–7.7)
Neutrophils Relative %: 80 %
Platelets: 77 10*3/uL — ABNORMAL LOW (ref 150–400)
RBC: 4.27 MIL/uL (ref 4.22–5.81)
RDW: 16.5 % — ABNORMAL HIGH (ref 11.5–15.5)
WBC: 6 10*3/uL (ref 4.0–10.5)
nRBC: 0 % (ref 0.0–0.2)

## 2019-08-30 LAB — COMPREHENSIVE METABOLIC PANEL
ALT: 10 U/L (ref 0–44)
AST: 11 U/L — ABNORMAL LOW (ref 15–41)
Albumin: 2.7 g/dL — ABNORMAL LOW (ref 3.5–5.0)
Alkaline Phosphatase: 85 U/L (ref 38–126)
Anion gap: 6 (ref 5–15)
BUN: 7 mg/dL — ABNORMAL LOW (ref 8–23)
CO2: 20 mmol/L — ABNORMAL LOW (ref 22–32)
Calcium: 6.7 mg/dL — ABNORMAL LOW (ref 8.9–10.3)
Chloride: 115 mmol/L — ABNORMAL HIGH (ref 98–111)
Creatinine, Ser: 0.6 mg/dL — ABNORMAL LOW (ref 0.61–1.24)
GFR calc Af Amer: >60 mL/min (ref >60)
GFR calc non Af Amer: >60 mL/min (ref >60)
Glucose, Bld: 90 mg/dL (ref 70–99)
Potassium: 3.3 mmol/L — ABNORMAL LOW (ref 3.5–5.1)
Sodium: 141 mmol/L (ref 135–145)
Total Bilirubin: 0.4 mg/dL (ref 0.3–1.2)
Total Protein: 4.5 g/dL — ABNORMAL LOW (ref 6.5–8.1)

## 2019-08-30 MED ORDER — HEPARIN SOD (PORK) LOCK FLUSH 100 UNIT/ML IV SOLN
500.0000 [IU] | Freq: Once | INTRAVENOUS | Status: AC
  Administered 2019-08-30: 500 [IU]
  Filled 2019-08-30: qty 5

## 2019-08-30 MED ORDER — SODIUM CHLORIDE 0.9% FLUSH
10.0000 mL | Freq: Once | INTRAVENOUS | Status: AC
  Administered 2019-08-30: 10 mL
  Filled 2019-08-30: qty 10

## 2019-08-30 NOTE — Assessment & Plan Note (Signed)
He has some wheezes on exam He will continue to follow-up with pulmonologist for COPD management There is no contraindication for him to receive Covid vaccination

## 2019-08-30 NOTE — Assessment & Plan Note (Signed)
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

## 2019-08-30 NOTE — Assessment & Plan Note (Signed)
He has mild intermittent diarrhea It could be exacerbated by his medication I recommend him to take Imodium as needed

## 2019-08-30 NOTE — Assessment & Plan Note (Signed)
His last imaging studies showed excellent response to therapy I will continue port flush see him again in about 8 weeks  Due to excellent control of his disease, I do not plan to repeat imaging study again until a few more months he will continue treatment indefinitely

## 2019-08-30 NOTE — Progress Notes (Signed)
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 [facility]) His last imaging studies showed excellent response to therapy I will continue port flush see him again in about 8 weeks  Due to excellent control of his disease, I do not plan to repeat imaging study again until a few more months he will continue treatment indefinitely   Thrombocytopenia ([place]) 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   All questions were answered. The patient knows to call the clinic with any problems, questions or concerns. The total time spent in the appointment was 20 minutes encounter with patients including review of chart and various tests results, discussions about plan of care and coordination of care plan   [name], MD 08/30/2019 9:12 AM  INTERVAL HISTORY: Please see below for problem oriented charting. 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  No evidence of aortic dissection or other acute process in the chest. 2.  Centrilobular emphysema with a 5 mm right lung nodule. Given the concurrent centrilobular emphysema, follow-up chest CT at 6 -12 months is recommended.  3.  Coronary artery atherosclerosis which is age advanced. 4.  Prominent thoracic lymph nodes.  These can be reevaluated at follow-up.   05/06/2010 Imaging   1.  Multiple small periaortic lymph nodes consistent with the patient's history of the chronic lymphocytic leukemia. 2.  No evidence of solid organ involvement   11/03/2011 Imaging   1.  Interval progression of abdominal and pelvic adenopathy. 2.  Progression of splenomegaly.  The spleen now measures 23 cm in length   11/18/2011 Bone Marrow Biopsy   Bone Marrow, Aspirate,Biopsy, and Clot, right iliac bone - HYPERCELLULAR BONE MARROW WITH EXTENSIVE INVOLVEMENT BY CHRONIC LYMPHOCYTIC LEUKEMIA. PERIPHERAL BLOOD: - CHRONIC LYMPHOCYTIC LEUKEMIA   03/08/2012 Imaging   1.  Progressive increase in retroperitoneal, iliac, and inguinal lymphadenopathy. 2.  Interval increase in massive splenomegaly.    06/01/2012 Procedure   Placement of single lumen port a cath via right internal jugular vein.  The catheter tip lies at the cavoatrial junction.  A power injectable port a cath was placed and is ready for immediate use   06/20/2012 - 11/23/2012 Chemotherapy  He received FCR x 6 cycles   12/19/2012 Imaging   Left common iliac stent. Abdominal vasculature remains patent. Improving supraclavicular and axillary lymphadenopathy. Residual right subpectoral nodes measure up to 10 mm short axis. Improving  retroperitoneal lymphadenopathy, measuring up to 16 mm short axis. Improving splenomegaly, measuring 18.7 cm.    01/20/2016 Imaging   1. Stable exam.  No new or progressive findings. 2. No CT findings to explain odynophagia   09/02/2016 Pathology Results   The findings are consistent with involvement by previously known chronic lymphocytic leukemia   09/02/2016 Pathology Results   FISH for CLL came back positive for deletion 13q   09/15/2016 Imaging   1. Borderline enlarged abdominal and pelvic lymph nodes. Compared with 11/03/2011 these are decreased in size as detailed above. 2. Persistent splenomegaly. 3. Aortic Atherosclerosis (ICD10-I70.0). LAD coronary artery calcification noted.   10/06/2016 - 02/24/2017 Chemotherapy   He received Bendamustine and Rituxan   11/03/2016 Adverse Reaction   Dose of Bendamustine is reduced due to severe pancytopenia   12/28/2016 Imaging   1. Borderline enlarged abdominal peritoneal ligament and abdominal retroperitoneal lymph nodes, stable. 2. Splenomegaly. 3.  Aortic atherosclerosis (ICD10-170.0).   03/22/2017 PET scan   1. No hypermetabolic adenopathy identified within the neck, chest, abdomen or pelvis. 2. Prominent left retroperitoneal node measures 1.6 cm without significant FDG uptake. 3. Splenomegaly. 4. Aortic Atherosclerosis (ICD10-I70.0) and Emphysema (ICD10-J43.9). LAD and left circumflex atherosclerotic calcifications noted.   02/02/2018 Procedure   IMPRESSION: Widely patent right IJ power port catheter.   04/06/2018 Imaging   1. Interval enlargement of axillary, mediastinal, and retroperitoneal lymph nodes, as well as increased splenomegaly, findings concerning for progression of CLL in comparison to prior PET-CT dated 03/22/2017.  2.  Other chronic and incidental findings as detailed above.    04/16/2018 -  Chemotherapy   The patient had acalabrutinib for chemo   04/17/2018 - 04/21/2018 Hospital Admission   He was hospitalized for  influenza   11/29/2018 Imaging   1. Interval response to therapy. No thoracic added not scratch set no thoracic or pelvic adenopathy identified. Significant improvement in abdominal adenopathy. Largest remaining lymph node measures 1.3cm in the left retroperitoneal region. Previously 2.5 cm. 2. Persistent splenomegaly, improved from previous exam. 3. No new or progressive disease identified within the chest, abdomen or pelvis. 4. Stable appearance of 5 mm right middle lobe lung nodule. 5. Aortic Atherosclerosis (ICD10-I70.0) and Emphysema (ICD10-J43.9). Coronary artery calcifications.       REVIEW OF SYSTEMS:   Constitutional: Denies fevers, chills or abnormal weight loss Eyes: Denies blurriness of vision Ears, nose, mouth, throat, and face: Denies mucositis or sore throat Cardiovascular: Denies palpitation, chest discomfort or lower extremity swelling Skin: Denies abnormal skin rashes Lymphatics: Denies new lymphadenopathy  Neurological:Denies numbness, tingling or new weaknesses Behavioral/Psych: Mood is stable, no new changes  All other systems were reviewed with the patient and are negative.  I have reviewed the past medical history, past surgical history, social history and family history with the patient and they are unchanged from previous note.  ALLERGIES:  is allergic to codeine.  MEDICATIONS:  Current Outpatient Medications  Medication Sig Dispense Refill  . acyclovir (ZOVIRAX) 400 MG tablet TAKE 1 TABLET BY MOUTH TWICE A DAY 180 tablet 1  . albuterol (PROVENTIL HFA) 108 (90 Base) MCG/ACT inhaler TAKE 2 PUFFS BY MOUTH EVERY 6 HOURS AS NEEDED FOR WHEEZE OR SHORTNESS OF BREATH 18 g 11  . allopurinol (ZYLOPRIM) 300 MG tablet TAKE 1 TABLET  BY MOUTH EVERY DAY 90 tablet 1  . ALPRAZolam (XANAX) 0.25 MG tablet Take 0.5 mg by mouth as needed for anxiety.     [place] Kitchen b complex vitamins tablet Take 1 tablet by mouth daily.     . benzonatate (TESSALON) 100 MG capsule Take 2 capsules (200  mg total) by mouth 2 (two) times daily as needed for cough. 20 capsule 0  . CALQUENCE 100 MG capsule TAKE 100 MG BY MOUTH 2 (TWO) TIMES DAILY. 60 capsule 11  . Cholecalciferol (VITAMIN D-1000 MAX ST) 1000 units tablet Take 1,000 Units by mouth daily.     . famotidine (PEPCID) 20 MG tablet TAKE 1 TABLET BY MOUTH TWICE A DAY 180 tablet 3  . fluticasone (FLONASE) 50 MCG/ACT nasal spray Place 2 sprays into the nose daily.     . fluticasone furoate-vilanterol (BREO ELLIPTA) 200-25 MCG/INH AEPB Inhale 1 puff into the lungs daily. 1 each 5  . furosemide (LASIX) 20 MG tablet TAKE 1 TABLET BY MOUTH EVERY DAY AS NEEDED 90 tablet 2  . HYDROcodone-acetaminophen (NORCO) 10-325 MG tablet Take 1 tablet by mouth See admin instructions. Every 6 to 8 hours as needed for pain  0  . lidocaine-prilocaine (EMLA) cream APPLY TO AFFECTED AREA ONCE 30 g 3  . loperamide (IMODIUM) 2 MG capsule TAKE 2 CAPSULES BY MOUTH AS NEEDED DIARRHEA OR LOOSE STOOLS 60 capsule 2  . meclizine (ANTIVERT) 12.5 MG tablet Take 12.5 mg by mouth every 6 (six) hours as needed for dizziness.   0  . metFORMIN (GLUCOPHAGE-XR) 500 MG 24 hr tablet Take 500 mg by mouth See admin instructions. Takes one tablet in AM and 2 tablets at night.    . montelukast (SINGULAIR) 10 MG tablet Take 1 tablet (10 mg total) by mouth daily. 30 tablet 5  . nitroGLYCERIN (NITROSTAT) 0.4 MG SL tablet PLACE 1 TABLET UNDER THE TONGUE EVERY 5 MINUTES X 3 DOSES AS NEEDED FOR CHEST PAIN *MAX 3 DOSES* 25 tablet 3  . pregabalin (LYRICA) 200 MG capsule Take 1 capsule (200 mg total) by mouth 3 (three) times daily. 270 capsule 1  . rosuvastatin (CRESTOR) 5 MG tablet TAKE 1 TABLET BY MOUTH EVERY DAY 90 tablet 3  . sildenafil (VIAGRA) 100 MG tablet Take 0.5-1 tablets (50-100 mg total) by mouth as needed for erectile dysfunction. 10 tablet 3  . zolpidem (AMBIEN) 10 MG tablet Take 10 mg by mouth at bedtime as needed for sleep.     No current facility-administered medications for this  visit.   Facility-Administered Medications Ordered in Other Visits  Medication Dose Route Frequency Provider Last Rate Last Admin  . 0.9 %  sodium chloride infusion   Intravenous Continuous [name], MD 50 mL/hr at 03/07/14 1005 New Bag at 03/07/14 1005  . sodium chloride 0.9 % injection 10 mL  10 mL Intracatheter PRN Marcy Panning, MD   10 mL at 08/22/12 1721    PHYSICAL EXAMINATION: ECOG PERFORMANCE STATUS: 1 - Symptomatic but completely ambulatory  Vitals:   08/30/19 0856  BP: (!) 116/57  Pulse: 73  Resp: 18  Temp: 98.7 F (37.1 C)  SpO2: 98%   Filed Weights   08/30/19 0856  Weight: 176 lb 12.8 oz (80.2 kg)    GENERAL:alert, no distress and comfortable SKIN: Noted extensive skin bruises EYES: normal, Conjunctiva are pink and non-injected, sclera clear OROPHARYNX:no exudate, no erythema and lips, buccal mucosa, and tongue normal  NECK: supple, thyroid normal size, non-tender, without nodularity LYMPH:  no  palpable lymphadenopathy in the cervical, axillary or inguinal LUNGS: Mild scattered wheeze on the left lower lobe HEART: regular rate & rhythm and no murmurs and no lower extremity edema ABDOMEN:abdomen soft, non-tender and normal bowel sounds Musculoskeletal:no cyanosis of digits and no clubbing  NEURO: alert & oriented x 3 with fluent speech, no focal motor/sensory deficits  LABORATORY DATA:  I have reviewed the data as listed    Component Value Date/Time   NA 142 07/04/2019 1042   NA 142 06/19/2019 0848   NA 136 01/26/2017 0940   K 4.3 07/04/2019 1042   K 4.0 01/26/2017 0940   CL 107 07/04/2019 1042   CL 104 08/03/2012 1229   CO2 27 07/04/2019 1042   CO2 23 01/26/2017 0940   GLUCOSE 93 07/04/2019 1042   GLUCOSE 146 (H) 01/26/2017 0940   GLUCOSE 223 (H) 08/03/2012 1229   BUN 9 07/04/2019 1042   BUN 13 06/19/2019 0848   BUN 9.6 01/26/2017 0940   CREATININE 0.77 07/04/2019 1042   CREATININE 1.04 04/24/2018 0821   CREATININE 1.0 01/26/2017 0940    CALCIUM 8.8 (L) 07/04/2019 1042   CALCIUM 8.7 01/26/2017 0940   PROT 5.6 (L) 07/04/2019 1042   PROT 5.9 (L) 04/13/2017 0823   PROT 5.7 (L) 01/26/2017 0940   ALBUMIN 3.6 07/04/2019 1042   ALBUMIN 3.6 01/26/2017 0940   AST 21 07/04/2019 1042   AST 13 (L) 04/24/2018 0821   AST 13 01/26/2017 0940   ALT 28 07/04/2019 1042   ALT 10 04/24/2018 0821   ALT 12 01/26/2017 0940   ALKPHOS 101 07/04/2019 1042   ALKPHOS 78 01/26/2017 0940   BILITOT 0.6 07/04/2019 1042   BILITOT 0.7 04/24/2018 0821   BILITOT 0.64 01/26/2017 0940   GFRNONAA >60 07/04/2019 1042   GFRNONAA >60 04/24/2018 0821   GFRAA >60 07/04/2019 1042   GFRAA >60 04/24/2018 0821    No results found for: SPEP, UPEP  Lab Results  Component Value Date   WBC 6.0 08/30/2019   NEUTROABS 4.8 08/30/2019   HGB 13.6 08/30/2019   HCT 41.0 08/30/2019   MCV 96.0 08/30/2019   PLT 77 (L) 08/30/2019      Chemistry      Component Value Date/Time   NA 142 07/04/2019 1042   NA 142 06/19/2019 0848   NA 136 01/26/2017 0940   K 4.3 07/04/2019 1042   K 4.0 01/26/2017 0940   CL 107 07/04/2019 1042   CL 104 08/03/2012 1229   CO2 27 07/04/2019 1042   CO2 23 01/26/2017 0940   BUN 9 07/04/2019 1042   BUN 13 06/19/2019 0848   BUN 9.6 01/26/2017 0940   CREATININE 0.77 07/04/2019 1042   CREATININE 1.04 04/24/2018 0821   CREATININE 1.0 01/26/2017 0940      Component Value Date/Time   CALCIUM 8.8 (L) 07/04/2019 1042   CALCIUM 8.7 01/26/2017 0940   ALKPHOS 101 07/04/2019 1042   ALKPHOS 78 01/26/2017 0940   AST 21 07/04/2019 1042   AST 13 (L) 04/24/2018 0821   AST 13 01/26/2017 0940   ALT 28 07/04/2019 1042   ALT 10 04/24/2018 0821   ALT 12 01/26/2017 0940   BILITOT 0.6 07/04/2019 1042   BILITOT 0.7 04/24/2018 0821   BILITOT 0.64 01/26/2017 0940       RADIOGRAPHIC STUDIES: I have personally reviewed the radiological images as listed and agreed with the findings in the report. VAS [place] ABI WITH/WO TBI  Result Date:  08/16/2019 LOWER EXTREMITY DOPPLER  STUDY Indications: Peripheral artery disease. High Risk Factors: Hypertension, hyperlipidemia, Diabetes.  Vascular Interventions: Left CIA stent/PTA and popliteal artery thrombectomy                         08/06/2008. Performing Technologist: Ronal Fear RVS, RCS  Examination Guidelines: A complete evaluation includes at minimum, Doppler waveform signals and systolic blood pressure reading at the level of bilateral brachial, anterior tibial, and posterior tibial arteries, when vessel segments are accessible. Bilateral testing is considered an integral part of a complete examination. Photoelectric Plethysmograph (PPG) waveforms and toe systolic pressure readings are included as required and additional duplex testing as needed. Limited examinations for reoccurring indications may be performed as noted.  ABI Findings: +---------+------------------+-----+--------+--------+ Right    Rt Pressure (mmHg)IndexWaveformComment  +---------+------------------+-----+--------+--------+ Brachial 111                                     +---------+------------------+-----+--------+--------+ PTA      111               1.00 biphasic         +---------+------------------+-----+--------+--------+ DP       122               1.10 biphasic         +---------+------------------+-----+--------+--------+ Great Toe107               0.96                  +---------+------------------+-----+--------+--------+ +---------+------------------+-----+--------+-------+ Left     Lt Pressure (mmHg)IndexWaveformComment +---------+------------------+-----+--------+-------+ Brachial 105                                    +---------+------------------+-----+--------+-------+ PTA      125               1.13 biphasic        +---------+------------------+-----+--------+-------+ DP       123               1.11 biphasic         +---------+------------------+-----+--------+-------+ Great Toe103               0.93                 +---------+------------------+-----+--------+-------+ +-------+-----------+-----------+------------+------------+ ABI/TBIToday's ABIToday's TBIPrevious ABIPrevious TBI +-------+-----------+-----------+------------+------------+ Right  1.10       0.96       1.23        1.06         +-------+-----------+-----------+------------+------------+ Left   1.11       0.93       1.17        0.73         +-------+-----------+-----------+------------+------------+ Bilateral ABIs and TBIs appear essentially unchanged compared to prior study on 01/29/2016.  Summary: Right: Resting right ankle-brachial index is within normal range. No evidence of significant right lower extremity arterial disease. The right toe-brachial index is normal. Left: Resting left ankle-brachial index is within normal range. No evidence of significant left lower extremity arterial disease. The left toe-brachial index is normal.  *See table(s) above for measurements and observations.  Electronically signed by Servando Snare MD on 08/16/2019 at 12:45:08 PM.    Final

## 2019-09-01 ENCOUNTER — Other Ambulatory Visit: Payer: Self-pay | Admitting: Neurology

## 2019-09-02 MED FILL — CALQUENCE 100 MG CAPSULE: 100 | 30 days supply | Qty: 60 | Fill #5

## 2019-09-04 ENCOUNTER — Encounter: Payer: Self-pay | Admitting: Hematology and Oncology

## 2019-09-04 NOTE — Progress Notes (Signed)
Copay physician form received on fax from [place]. Patient applied himself.  Called Elizabeth in oral chemo to confirm he has funds remaining for oral chemo through [employer]. He does.  Called patient to inquire about which medication this would be for due to him having a grant open already and claims needing to be submitted under new grant if approved every 90 days.  There was no answer and no voicemail. Will hold paperwork until speak with patient.

## 2019-09-09 ENCOUNTER — Encounter: Payer: Self-pay | Admitting: Hematology and Oncology

## 2019-09-17 ENCOUNTER — Other Ambulatory Visit: Payer: Self-pay | Admitting: Neurology

## 2019-09-21 ENCOUNTER — Other Ambulatory Visit: Payer: Self-pay | Admitting: Hematology and Oncology

## 2019-09-21 DIAGNOSIS — C9110 Chronic lymphocytic leukemia of B-cell type not having achieved remission: Secondary | ICD-10-CM

## 2019-09-26 MED FILL — CALQUENCE 100 MG CAPSULE: 100 | 30 days supply | Qty: 60 | Fill #6

## 2019-10-01 ENCOUNTER — Encounter: Payer: Self-pay | Admitting: Hematology and Oncology

## 2019-10-01 ENCOUNTER — Ambulatory Visit: Payer: Medicare Other | Admitting: Neurology

## 2019-10-03 MED ORDER — NITROGLYCERIN 0.4 MG SL SUBL: SUBLINGUAL_TABLET | SUBLINGUAL | 3 refills | Status: DC

## 2019-10-22 ENCOUNTER — Telehealth: Payer: Self-pay | Admitting: Neurology

## 2019-10-22 ENCOUNTER — Other Ambulatory Visit: Payer: Self-pay

## 2019-10-22 ENCOUNTER — Encounter: Payer: Self-pay | Admitting: Neurology

## 2019-10-22 ENCOUNTER — Ambulatory Visit (INDEPENDENT_AMBULATORY_CARE_PROVIDER_SITE_OTHER): Payer: Medicare Other | Admitting: Neurology

## 2019-10-22 VITALS — BP 113/62 | HR 73 | Ht 72.0 in | Wt 170.5 lb

## 2019-10-22 DIAGNOSIS — H814 Vertigo of central origin: Secondary | ICD-10-CM | POA: Diagnosis not present

## 2019-10-22 DIAGNOSIS — I259 Chronic ischemic heart disease, unspecified: Secondary | ICD-10-CM | POA: Diagnosis not present

## 2019-10-22 DIAGNOSIS — R269 Unspecified abnormalities of gait and mobility: Secondary | ICD-10-CM | POA: Diagnosis not present

## 2019-10-22 DIAGNOSIS — G629 Polyneuropathy, unspecified: Secondary | ICD-10-CM

## 2019-10-22 DIAGNOSIS — R42 Dizziness and giddiness: Secondary | ICD-10-CM | POA: Diagnosis not present

## 2019-10-22 MED ORDER — PROPRANOLOL HCL 20 MG PO TABS: 20.0000 mg | ORAL_TABLET | Freq: Two times a day (BID) | ORAL | 6 refills | Status: DC

## 2019-10-22 NOTE — Progress Notes (Signed)
HISTORY OF PRESENT ILLNESS: 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, chronic lymphocytic leukemia, no evidence of solid organ involvement, he received chemotherapy FCR x6 cycles from May 2014 to October 2014, chemotherapyBendamustineand and rituximab from October 06, 2016 to February 24, 2017, acalabrutinib since April 16 2018  Most recent CAT scan on March 22, 2353, no hypermetabolic adenopathy identified within the neck, chest, abdomen or pelvic, prominent left retroperitoneal lymph node measuring 1.6 cm without significant FDG uptake, splenomegaly,  He has diabetes since 2012, still works as an Chief Financial Officer,  Hisdiabetic symptoms started around 2009, he noticed bilateral feet numbness tingling, at the bottom of his feet, actually underwent bilateral tarsal tunnel release surgery did not help his symptoms, reported abnormal EMG nerve conduction study in the past from outside office, never had a repeat study in the laboratory,  History of vitamin B12 deficiency has been on vitamin B12 IM supplement in the past, switch to p.o. later  He has tried and failed gabapentin, Cymbalta, Topamax, currently taking Lyrica 100 mg 3 times a day +200 mg at bedtime, and gabapentin 300 mg twice a day.  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 continue complains of bilateral toes and feet paresthesia,  burning pain, Lyrica 200 mg 3 times a day, works better for him, then gabapentin, previously tried and failed nortriptyline, gabapentin, Cymbalta, Topamax, Keppra 250 mg twice a day since June 2021, could not tolerated due to side effect of drowsiness, stopped taking it  Today his main concern is new onset dizziness since 2021, is mostly positional changes, it often triggered by hyperextension of his neck, sudden positional movement, sometimes when he lies down, or getting up, he described transient room spinning sensation, lasting less than 1 minute,  Review history, he had a history of peripheral vascular disease in the past, was seen by vascular surgery in 2017, status post left iliac PTA stent, left popliteal artery thrombectomy by Dr. Yolanda Bonine, is on antiplatelet agent aspirin 81 mg daily  He retired from Pharmacist, community, works with heavy equipment, had a history of loud noise exposure, no complaints of high-pitched bilateral tinnitus, decreased he  Lab Evaluations in September 2021: CMP, potassium 3.3, creatinine 0.6, hemoglobin of 13.6, RDW was elevated at 16.5, lipid panel, LDL 33 CT abdomen, chest in 2020, significant improvement in abdominal adenopathy, largest remaining lymph node measuring 1.3 cm in the left retroperitoneal region, stable appearance of 5 mm right midlung nodule, aortic atherosclerosis, and coronary artery calcification, emphysema  REVIEW OF SYSTEMS: Out of a complete 14 system review of symptoms, the patient complains only of the following symptoms, and all other reviewed systems are negative.  Burning  ALLERGIES: Allergies  Allergen Reactions  . Codeine Hives    Pt states he can take a few, more reaction with extended doses.    HOME MEDICATIONS: Outpatient Medications Prior to Visit  Medication Sig Dispense Refill  . acyclovir (ZOVIRAX) 400 MG tablet  TAKE 1 TABLET BY MOUTH TWICE A DAY 180 tablet 1  . albuterol (PROVENTIL HFA) 108 (90 Base) MCG/ACT inhaler TAKE  2 PUFFS BY MOUTH EVERY 6 HOURS AS NEEDED FOR WHEEZE OR SHORTNESS OF BREATH 18 g 11  . allopurinol (ZYLOPRIM) 300 MG tablet TAKE 1 TABLET BY MOUTH EVERY DAY 90 tablet 1  . ALPRAZolam (XANAX) 0.25 MG tablet Take 0.5 mg by mouth as needed for anxiety.     Marland Kitchen b complex vitamins tablet Take 1 tablet by mouth daily.     . benzonatate (TESSALON) 100 MG capsule Take 2 capsules (200 mg total) by mouth 2 (two) times daily as needed for cough. 20 capsule 0  . CALQUENCE 100 MG capsule TAKE 100 MG BY MOUTH 2 (TWO) TIMES DAILY. 60 capsule 11  . Cholecalciferol (VITAMIN D-1000 MAX ST) 1000 units tablet Take 1,000 Units by mouth daily.     . famotidine (PEPCID) 20 MG tablet TAKE 1 TABLET BY MOUTH TWICE A DAY 180 tablet 3  . fluticasone (FLONASE) 50 MCG/ACT nasal spray Place 2 sprays into the nose daily.     . fluticasone furoate-vilanterol (BREO ELLIPTA) 200-25 MCG/INH AEPB Inhale 1 puff into the lungs daily. 1 each 5  . furosemide (LASIX) 20 MG tablet TAKE 1 TABLET BY MOUTH EVERY DAY AS NEEDED 90 tablet 2  . HYDROcodone-acetaminophen (NORCO) 10-325 MG tablet Take 1 tablet by mouth See admin instructions. Every 6 to 8 hours as needed for pain  0  . lidocaine-prilocaine (EMLA) cream APPLY TO AFFECTED AREA ONCE 30 g 3  . loperamide (IMODIUM) 2 MG capsule TAKE 2 CAPSULES BY MOUTH AS NEEDED DIARRHEA OR LOOSE STOOLS 60 capsule 2  . meclizine (ANTIVERT) 12.5 MG tablet Take 12.5 mg by mouth every 6 (six) hours as needed for dizziness.   0  . metFORMIN (GLUCOPHAGE-XR) 500 MG 24 hr tablet Take 500 mg by mouth See admin instructions. Takes one tablet in AM and 2 tablets at night.    . montelukast (SINGULAIR) 10 MG tablet Take 1 tablet (10 mg total) by mouth daily. 30 tablet 5  . nitroGLYCERIN (NITROSTAT) 0.4 MG SL tablet PLACE 1 TABLET UNDER THE TONGUE EVERY 5 MINUTES X 3 DOSES AS NEEDED FOR CHEST PAIN *MAX 3 DOSES* 25 tablet 3  . pregabalin (LYRICA) 200 MG capsule TAKE 1 CAPSULE (200 MG TOTAL) BY MOUTH 3 (THREE) TIMES  DAILY. 270 capsule 1  . rosuvastatin (CRESTOR) 5 MG tablet TAKE 1 TABLET BY MOUTH EVERY DAY 90 tablet 3  . sildenafil (VIAGRA) 100 MG tablet Take 0.5-1 tablets (50-100 mg total) by mouth as needed for erectile dysfunction. 10 tablet 3  . zolpidem (AMBIEN) 10 MG tablet Take 10 mg by mouth at bedtime as needed for sleep.     Facility-Administered Medications Prior to Visit  Medication Dose Route Frequency Provider Last Rate Last Admin  . 0.9 %  sodium chloride infusion   Intravenous Continuous [name], MD 50 mL/hr at 03/07/14 1005 New Bag at 03/07/14 1005  . sodium chloride 0.9 % injection 10 mL  10 mL Intracatheter PRN Marcy Panning, MD   10 mL at 08/22/12 1721    PAST MEDICAL HISTORY: Past Medical History:  Diagnosis Date  . Anxiety 01/30/2014  . Back injury    lower disc  . CAD (coronary artery disease)   . CLL (chronic lymphocytic leukemia) ([place]) 03/18/2011  . COPD (chronic obstructive pulmonary disease) ([place])   . Diabetes mellitus ([place])    Type 2   .  ECRB (extensor carpi radialis brevis) tenosynovitis   . GERD (gastroesophageal reflux disease)    takes Nexium if needed  . Hyperlipidemia   . Lateral epicondylitis of left elbow   . MI, acute, non ST segment elevation ([place]) 06/28/2009   with stenting of the LAD  . Neuromuscular disorder ([place])    peripheral neuropathy  . PVD (peripheral vascular disease) ([place])   . Tobacco abuse     PAST SURGICAL HISTORY: Past Surgical History:  Procedure Laterality Date  . ADENOIDECTOMY  1955  . CARDIAC CATHETERIZATION    . CARDIAC CATHETERIZATION N/A 09/26/2014   Procedure: Left Heart Cath and Coronary Angiography;  Surgeon: Peter M Martinique, MD;  Location: [facility];  Service: Cardiovascular;  Laterality: N/A;  . carpel tunnel release Left 04-1989  . carpel tunnel release  Right 01-1989  . CHOLECYSTECTOMY  2007  . CORONARY STENT PLACEMENT  May 2011  . femoral stents    . IR CV LINE INJECTION  08/18/2017  . IR CV LINE INJECTION   09/01/2017  . IR CV LINE INJECTION  02/02/2018  . LATERAL EPICONDYLE RELEASE Left 02/12/2014   Procedure: LEFT ELBOW DEBRIDEMENT WITH TENDON REPAIR ;  Surgeon: Lorn Junes, MD;  Location: [facility];  Service: Orthopedics;  Laterality: Left;  . LEFT CAI STENT/PTA AND POPLITEAL ARTERY/TIBIAL THROMBECTOMY     . LEFT HEART CATHETERIZATION WITH CORONARY ANGIOGRAM N/A 08/26/2011   Procedure: LEFT HEART CATHETERIZATION WITH CORONARY ANGIOGRAM;  Surgeon: Peter M Martinique, MD;  Location: [facility];  Service: Cardiovascular;  Laterality: N/A;  . PERIPHERAL VASCULAR CATHETERIZATION N/A 01/01/2015   Procedure: Abdominal Aortogram;  Surgeon: Conrad Meadow [place], MD;  Location: [facility];  Service: Cardiovascular;  Laterality: N/A;  . TARSAL TUNNEL RELEASE Bilateral 08-2007    FAMILY HISTORY: Family History  Problem Relation Age of Onset  . Lung cancer Mother 41  . Cancer Mother        lung  . Heart failure Father 11  . Heart disease Father     SOCIAL HISTORY: Social History   Socioeconomic History  . Marital status: Married    Spouse name: [name]  . Number of children: 1  . Years of education: College  . Highest education level: Not on file  Occupational History    Employer: [employer]    Comment: [employer]  Tobacco Use  . Smoking status: Former Smoker    Packs/day: 1.00    Years: 44.00    Pack years: 44.00    Types: Cigarettes    Quit date: 04/23/2016    Years since quitting: 3.4  . Smokeless tobacco: Former Network engineer  . Vaping Use: Never used  Substance and Sexual Activity  . Alcohol use: No    Alcohol/week: 0.0 standard drinks    Comment:  drinks non-alcoholic beer  . Drug use: No  . Sexual activity: Yes  Other Topics Concern  . Not on file  Social History Narrative   Patient lives at home with wife.   Caffeine Use: 15 cups of caffeine weekly   Social Determinants of Health   Financial Resource Strain:   . Difficulty of Paying Living Expenses: Not  on file  Food Insecurity:   . Worried About Charity fundraiser in the Last Year: Not on file  . Ran Out of Food in the Last Year: Not on file  Transportation Needs:   . Lack of Transportation (Medical): Not on file  . Lack  of Transportation (Non-Medical): Not on file  Physical Activity:   . Days of Exercise per Week: Not on file  . Minutes of Exercise per Session: Not on file  Stress:   . Feeling of Stress : Not on file  Social Connections:   . Frequency of Communication with Friends and Family: Not on file  . Frequency of Social Gatherings with Friends and Family: Not on file  . Attends Religious Services: Not on file  . Active Member of Clubs or Organizations: Not on file  . Attends Archivist Meetings: Not on file  . Marital Status: Not on file  Intimate Partner Violence:   . Fear of Current or Ex-Partner: Not on file  . Emotionally Abused: Not on file  . Physically Abused: Not on file  . Sexually Abused: Not on file  PHYSICAL EXAM  Vitals:   10/22/19 0853  BP: 113/62  Pulse: 73  Weight: 170 lb 8 oz (77.3 kg)  Height: 6' (1.829 m)   Body mass index is 23.12 kg/m.   PHYSICAL EXAMNIATION:  Gen: NAD, conversant, well nourised, well groomed               NEUROLOGICAL EXAM:  MENTAL STATUS: Speech/cognition: Awake alert oriented to history taking and casual conversation   CRANIAL NERVES: CN II: Visual fields are full to confrontation.  Pupils are round equal and briskly reactive to light. CN III, IV, VI: extraocular movement are normal. No ptosis. CN V: Facial sensation is intact to pinprick in all 3 divisions bilaterally. Corneal responses are intact.  CN VII: Face is symmetric with normal eye closure and smile. CN VIII: Hearing is normal to casual conversation CN IX, X: Palate elevates symmetrically. Phonation is normal. CN XI: Head turning and shoulder shrug are intact   MOTOR: Mild bilateral hand posturing tremor, no significant at bilateral upper  extremity proximal and distal muscles, no weakness at bilateral lower extremity proximal muscles, mild to moderate bilateral ankle dorsiflexion/toe extension weakness, also has mild bilateral ankle plantarflexion/toe flexion weakness.  REFLEXES: Reflexes are 1 and symmetric at the biceps, triceps, knees, and absent at ankles. Plantar responses are flexor.  SENSORY: Length dependent decreased vibratory sensation, pinprick to ankle level, decreased toe proprioception, preserved finger proprioception  COORDINATION: Rapid alternating movements and fine finger movements are intact. There is no dysmetria on finger-to-nose and heel-knee-shin.    GAIT/STANCE: He can get up from seated position arm crossed, wide-based, unsteady, bilateral foot drop, could not stand up on tiptoes, or heels, cannot perform tandem walking Romberg is present   DIAGNOSTIC DATA (LABS, IMAGING, TESTING) - I reviewed patient records, labs, notes, testing and imaging myself where available.  Lab Results  Component Value Date   WBC 6.0 08/30/2019   HGB 13.6 08/30/2019   HCT 41.0 08/30/2019   MCV 96.0 08/30/2019   PLT 77 (L) 08/30/2019      Component Value Date/Time   NA 141 08/30/2019 0840   NA 142 06/19/2019 0848   NA 136 01/26/2017 0940   K 3.3 (L) 08/30/2019 0840   K 4.0 01/26/2017 0940   CL 115 (H) 08/30/2019 0840   CL 104 08/03/2012 1229   CO2 20 (L) 08/30/2019 0840   CO2 23 01/26/2017 0940   GLUCOSE 90 08/30/2019 0840   GLUCOSE 146 (H) 01/26/2017 0940   GLUCOSE 223 (H) 08/03/2012 1229   BUN 7 (L) 08/30/2019 0840   BUN 13 06/19/2019 0848   BUN 9.6 01/26/2017 0940   CREATININE 0.60 (  L) 08/30/2019 0840   CREATININE 1.04 04/24/2018 0821   CREATININE 1.0 01/26/2017 0940   CALCIUM 6.7 (L) 08/30/2019 0840   CALCIUM 8.7 01/26/2017 0940   PROT 4.5 (L) 08/30/2019 0840   PROT 5.9 (L) 04/13/2017 0823   PROT 5.7 (L) 01/26/2017 0940   ALBUMIN 2.7 (L) 08/30/2019 0840   ALBUMIN 3.6 01/26/2017 0940   AST 11  (L) 08/30/2019 0840   AST 13 (L) 04/24/2018 0821   AST 13 01/26/2017 0940   ALT 10 08/30/2019 0840   ALT 10 04/24/2018 0821   ALT 12 01/26/2017 0940   ALKPHOS 85 08/30/2019 0840   ALKPHOS 78 01/26/2017 0940   BILITOT 0.4 08/30/2019 0840   BILITOT 0.7 04/24/2018 0821   BILITOT 0.64 01/26/2017 0940   GFRNONAA >60 08/30/2019 0840   GFRNONAA >60 04/24/2018 0821   GFRAA >60 08/30/2019 0840   GFRAA >60 04/24/2018 0821   Lab Results  Component Value Date   CHOL 79 (L) 06/19/2019   HDL 34 (L) 06/19/2019   LDLCALC 33 06/19/2019   LDLDIRECT 117.6 08/22/2011   TRIG 44 06/19/2019   CHOLHDL 2.3 06/19/2019   Lab Results  Component Value Date   HGBA1C 6.2 (H) 04/13/2017   Lab Results  Component Value Date   VITAMINB12 554 04/13/2017   Lab Results  Component Value Date   TSH 1.620 04/13/2017      ASSESSMENT AND PLAN 69 y.o. year old male  Peripheral neuropathy, slow onset with neuropathic pain since 2009 Slow onset gait abnormality  Symptom control with Lyrica 200 mg 3 times a day,  Previously tried and failed gabapentin, Cymbalta, nortriptyline, Keppra, Topamax  EMG nerve conduction study in March 2019 showed mild to moderate peripheral neuropathy,  Continue to progress, with slow onset gait abnormality,  Repeat EMG nerve conduction study New onset vertigo History of peripheral vascular disease, Previously longtime smoker Slowly progressive hearing loss, chronic tinnitus  MRI of the brain with without contrast with emphasized on internal acoustic canal to rule out inner ear pathology  Differentiation diagnoses also include posterior circulation insufficiency, MRI of the brain and neck  Keep aspirin 81 mg daily  Emphasized importance of volume expansion  Bilateral hands tremor,  Paternal grandfather suffered tremor, most likely essential tremor  Try Inderal 20 mg twice daily    Marcial Pacas, M.D. Ph.D.  [facility] [place], [place]  [address] Phone: [phone] Fax:      [phone]

## 2019-10-22 NOTE — Telephone Encounter (Signed)
Medicare/[place] life order sent to GI. No auth they will reach out to the patient to schedule.

## 2019-10-23 ENCOUNTER — Other Ambulatory Visit: Payer: Self-pay | Admitting: Neurology

## 2019-10-23 MED ORDER — DIAZEPAM 10 MG PO TABS: ORAL_TABLET | ORAL | 0 refills | Status: DC

## 2019-10-24 MED FILL — CALQUENCE 100 MG CAPSULE: 100 | 30 days supply | Qty: 60 | Fill #7

## 2019-10-25 ENCOUNTER — Inpatient Hospital Stay: Payer: Medicare Other

## 2019-10-25 ENCOUNTER — Encounter: Payer: Self-pay | Admitting: Hematology and Oncology

## 2019-10-25 ENCOUNTER — Telehealth: Payer: Self-pay | Admitting: Hematology and Oncology

## 2019-10-25 ENCOUNTER — Inpatient Hospital Stay: Payer: Medicare Other | Attending: Hematology and Oncology | Admitting: Hematology and Oncology

## 2019-10-25 ENCOUNTER — Other Ambulatory Visit: Payer: Self-pay

## 2019-10-25 DIAGNOSIS — I259 Chronic ischemic heart disease, unspecified: Secondary | ICD-10-CM | POA: Diagnosis not present

## 2019-10-25 DIAGNOSIS — C8308 Small cell B-cell lymphoma, lymph nodes of multiple sites: Secondary | ICD-10-CM

## 2019-10-25 DIAGNOSIS — D649 Anemia, unspecified: Secondary | ICD-10-CM | POA: Diagnosis not present

## 2019-10-25 DIAGNOSIS — R197 Diarrhea, unspecified: Secondary | ICD-10-CM | POA: Diagnosis not present

## 2019-10-25 DIAGNOSIS — C9110 Chronic lymphocytic leukemia of B-cell type not having achieved remission: Secondary | ICD-10-CM | POA: Diagnosis not present

## 2019-10-25 DIAGNOSIS — I2510 Atherosclerotic heart disease of native coronary artery without angina pectoris: Secondary | ICD-10-CM | POA: Insufficient documentation

## 2019-10-25 DIAGNOSIS — R5383 Other fatigue: Secondary | ICD-10-CM | POA: Diagnosis not present

## 2019-10-25 DIAGNOSIS — D801 Nonfamilial hypogammaglobulinemia: Secondary | ICD-10-CM

## 2019-10-25 DIAGNOSIS — Z95828 Presence of other vascular implants and grafts: Secondary | ICD-10-CM

## 2019-10-25 DIAGNOSIS — D61818 Other pancytopenia: Secondary | ICD-10-CM | POA: Diagnosis not present

## 2019-10-25 DIAGNOSIS — Z79899 Other long term (current) drug therapy: Secondary | ICD-10-CM | POA: Insufficient documentation

## 2019-10-25 DIAGNOSIS — R161 Splenomegaly, not elsewhere classified: Secondary | ICD-10-CM | POA: Diagnosis not present

## 2019-10-25 DIAGNOSIS — D539 Nutritional anemia, unspecified: Secondary | ICD-10-CM | POA: Insufficient documentation

## 2019-10-25 LAB — CBC WITH DIFFERENTIAL/PLATELET
Abs Immature Granulocytes: 0.07 10*3/uL (ref 0.00–0.07)
Basophils Absolute: 0 10*3/uL (ref 0.0–0.1)
Basophils Relative: 1 %
Eosinophils Absolute: 0 10*3/uL (ref 0.0–0.5)
Eosinophils Relative: 0 %
HCT: 37.4 % — ABNORMAL LOW (ref 39.0–52.0)
Hemoglobin: 12.3 g/dL — ABNORMAL LOW (ref 13.0–17.0)
Immature Granulocytes: 2 %
Lymphocytes Relative: 12 %
Lymphs Abs: 0.5 10*3/uL — ABNORMAL LOW (ref 0.7–4.0)
MCH: 32.6 pg (ref 26.0–34.0)
MCHC: 32.9 g/dL (ref 30.0–36.0)
MCV: 99.2 fL (ref 80.0–100.0)
Monocytes Absolute: 0.3 10*3/uL (ref 0.1–1.0)
Monocytes Relative: 7 %
Neutro Abs: 3.2 10*3/uL (ref 1.7–7.7)
Neutrophils Relative %: 78 %
Platelets: 66 10*3/uL — ABNORMAL LOW (ref 150–400)
RBC: 3.77 MIL/uL — ABNORMAL LOW (ref 4.22–5.81)
RDW: 16.4 % — ABNORMAL HIGH (ref 11.5–15.5)
WBC: 4.1 10*3/uL (ref 4.0–10.5)
nRBC: 0 % (ref 0.0–0.2)

## 2019-10-25 LAB — COMPREHENSIVE METABOLIC PANEL
ALT: <6 U/L (ref 0–44)
AST: 8 U/L — ABNORMAL LOW (ref 15–41)
Albumin: 3.3 g/dL — ABNORMAL LOW (ref 3.5–5.0)
Alkaline Phosphatase: 93 U/L (ref 38–126)
Anion gap: 6 (ref 5–15)
BUN: 14 mg/dL (ref 8–23)
CO2: 28 mmol/L (ref 22–32)
Calcium: 8.5 mg/dL — ABNORMAL LOW (ref 8.9–10.3)
Chloride: 106 mmol/L (ref 98–111)
Creatinine, Ser: 0.75 mg/dL (ref 0.61–1.24)
GFR calc Af Amer: >60 mL/min (ref >60)
GFR calc non Af Amer: >60 mL/min (ref >60)
Glucose, Bld: 91 mg/dL (ref 70–99)
Potassium: 4.3 mmol/L (ref 3.5–5.1)
Sodium: 140 mmol/L (ref 135–145)
Total Bilirubin: 0.5 mg/dL (ref 0.3–1.2)
Total Protein: 5.3 g/dL — ABNORMAL LOW (ref 6.5–8.1)

## 2019-10-25 MED ORDER — HEPARIN SOD (PORK) LOCK FLUSH 100 UNIT/ML IV SOLN
500.0000 [IU] | Freq: Once | INTRAVENOUS | Status: AC
  Administered 2019-10-25: 500 [IU]
  Filled 2019-10-25: qty 5

## 2019-10-25 MED ORDER — SODIUM CHLORIDE 0.9% FLUSH
10.0000 mL | Freq: Once | INTRAVENOUS | Status: AC
  Administered 2019-10-25: 10 mL
  Filled 2019-10-25: qty 10

## 2019-10-25 MED ORDER — LOPERAMIDE HCL 2 MG PO CAPS: 4.0000 mg | ORAL_CAPSULE | Freq: Four times a day (QID) | ORAL | 2 refills | Status: DC | PRN

## 2019-10-25 NOTE — Assessment & Plan Note (Signed)
His last imaging studies showed excellent response to therapy I will continue port flush and see him again in about 8 weeks  Due to excellent control of his disease, I do not plan to repeat imaging study again until a few more months he will continue treatment indefinitely

## 2019-10-25 NOTE — Assessment & Plan Note (Addendum)
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

## 2019-10-25 NOTE — Progress Notes (Signed)
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 [facility]) His last imaging studies showed excellent response to therapy I will continue port flush and see him again in about 8 weeks  Due to excellent control of his disease, I do not plan to repeat imaging study again until a few more months he will continue treatment indefinitely   Pancytopenia, acquired [facility]) 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 Vitamin B12    Standing Status:   Standing    Number of Occurrences:   1    Standing Expiration Date:   10/24/2020  . TSH    Standing Status:   Standing    Number of Occurrences:   1    Standing Expiration Date:   10/24/2020  . CBC with Differential/Platelet    Standing Status:   Standing    Number of Occurrences:   22    Standing Expiration Date:   10/24/2020  . Comprehensive metabolic panel    Standing Status:   Standing    Number of Occurrences:   33    Standing Expiration Date:   10/24/2020    All questions were answered. The patient knows to call the clinic with any problems, questions or concerns. The total time spent in the appointment was 20 minutes encounter with patients including review of chart and various tests results, discussions about plan of care and coordination of care plan   [name], MD 10/25/2019 9:32 AM  INTERVAL HISTORY: Please see below for problem  oriented charting. He returns for further follow-up He complained of excessive fatigue even though he sleeps over 9 hours sometimes He continues to bruise easily The patient denies any recent signs or symptoms of bleeding such as spontaneous epistaxis, hematuria or hematochezia. 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  No evidence of aortic dissection or other acute process in the chest. 2.  Centrilobular emphysema with a 5 mm right lung nodule. Given the concurrent centrilobular emphysema, follow-up chest CT at 6 -12 months is recommended.  3.  Coronary artery atherosclerosis which is age advanced. 4.  Prominent thoracic lymph nodes.  These can be reevaluated at follow-up.   05/06/2010 Imaging   1.  Multiple small periaortic lymph nodes consistent with the patient's history of the chronic lymphocytic leukemia. 2.  No evidence of solid organ involvement   11/03/2011 Imaging   1.  Interval progression of abdominal and pelvic adenopathy. 2.  Progression of splenomegaly.  The spleen now measures 23 cm in length   11/18/2011 Bone Marrow Biopsy   Bone Marrow, Aspirate,Biopsy,  and Clot, right iliac bone - HYPERCELLULAR BONE MARROW WITH EXTENSIVE INVOLVEMENT BY CHRONIC LYMPHOCYTIC LEUKEMIA. PERIPHERAL BLOOD: - CHRONIC LYMPHOCYTIC LEUKEMIA   03/08/2012 Imaging   1.  Progressive increase in retroperitoneal, iliac, and inguinal lymphadenopathy. 2.  Interval increase in massive splenomegaly.    06/01/2012 Procedure   Placement of single lumen port a cath via right internal jugular vein.  The catheter tip lies at the cavoatrial junction.  A power injectable port  a cath was placed and is ready for immediate use   06/20/2012 - 11/23/2012 Chemotherapy   He received FCR x 6 cycles   12/19/2012 Imaging   Left common iliac stent. Abdominal vasculature remains patent. Improving supraclavicular and axillary lymphadenopathy. Residual right subpectoral nodes measure up to 10 mm short axis. Improving retroperitoneal lymphadenopathy, measuring up to 16 mm short axis. Improving splenomegaly, measuring 18.7 cm.    01/20/2016 Imaging   1. Stable exam.  No new or progressive findings. 2. No CT findings to explain odynophagia   09/02/2016 Pathology Results   The findings are consistent with involvement by previously known chronic lymphocytic leukemia   09/02/2016 Pathology Results   FISH for CLL came back positive for deletion 13q   09/15/2016 Imaging   1. Borderline enlarged abdominal and pelvic lymph nodes. Compared with 11/03/2011 these are decreased in size as detailed above. 2. Persistent splenomegaly. 3. Aortic Atherosclerosis (ICD10-I70.0). LAD coronary artery calcification noted.   10/06/2016 - 02/24/2017 Chemotherapy   He received Bendamustine and Rituxan   11/03/2016 Adverse Reaction   Dose of Bendamustine is reduced due to severe pancytopenia   12/28/2016 Imaging   1. Borderline enlarged abdominal peritoneal ligament and abdominal retroperitoneal lymph nodes, stable. 2. Splenomegaly. 3.  Aortic atherosclerosis (ICD10-170.0).   03/22/2017 PET scan   1. No hypermetabolic adenopathy identified within the neck, chest, abdomen or pelvis. 2. Prominent left retroperitoneal node measures 1.6 cm without significant FDG uptake. 3. Splenomegaly. 4. Aortic Atherosclerosis (ICD10-I70.0) and Emphysema (ICD10-J43.9). LAD and left circumflex atherosclerotic calcifications noted.   02/02/2018 Procedure   IMPRESSION: Widely patent right IJ power port catheter.   04/06/2018 Imaging   1. Interval enlargement of axillary, mediastinal, and retroperitoneal lymph nodes,  as well as increased splenomegaly, findings concerning for progression of CLL in comparison to prior PET-CT dated 03/22/2017.  2.  Other chronic and incidental findings as detailed above.    04/16/2018 -  Chemotherapy   The patient had acalabrutinib for chemo   04/17/2018 - 04/21/2018 Hospital Admission   He was hospitalized for influenza   11/29/2018 Imaging   1. Interval response to therapy. No thoracic added not scratch set no thoracic or pelvic adenopathy identified. Significant improvement in abdominal adenopathy. Largest remaining lymph node measures 1.3cm in the left retroperitoneal region. Previously 2.5 cm. 2. Persistent splenomegaly, improved from previous exam. 3. No new or progressive disease identified within the chest, abdomen or pelvis. 4. Stable appearance of 5 mm right middle lobe lung nodule. 5. Aortic Atherosclerosis (ICD10-I70.0) and Emphysema (ICD10-J43.9). Coronary artery calcifications.       REVIEW OF SYSTEMS:   Constitutional: Denies fevers, chills or abnormal weight loss Eyes: Denies blurriness of vision Ears, nose, mouth, throat, and face: Denies mucositis or sore throat Respiratory: Denies cough, dyspnea or wheezes Cardiovascular: Denies palpitation, chest discomfort or lower extremity swelling Skin: Denies abnormal skin rashes Lymphatics: Denies new lymphadenopathy  Neurological:Denies numbness, tingling or new weaknesses Behavioral/Psych: Mood is stable, no new changes  All other systems were reviewed with the patient and are  negative.  I have reviewed the past medical history, past surgical history, social history and family history with the patient and they are unchanged from previous note.  ALLERGIES:  is allergic to codeine.  MEDICATIONS:  Current Outpatient Medications  Medication Sig Dispense Refill  . acyclovir (ZOVIRAX) 400 MG tablet TAKE 1 TABLET BY MOUTH TWICE A DAY 180 tablet 1  . albuterol (PROVENTIL HFA) 108 (90 Base) MCG/ACT inhaler  TAKE 2 PUFFS BY MOUTH EVERY 6 HOURS AS NEEDED FOR WHEEZE OR SHORTNESS OF BREATH 18 g 11  . allopurinol (ZYLOPRIM) 300 MG tablet TAKE 1 TABLET BY MOUTH EVERY DAY 90 tablet 1  . ALPRAZolam (XANAX) 0.25 MG tablet Take 0.5 mg by mouth as needed for anxiety.     Marland Kitchen b complex vitamins tablet Take 1 tablet by mouth daily.     . benzonatate (TESSALON) 100 MG capsule Take 2 capsules (200 mg total) by mouth 2 (two) times daily as needed for cough. 20 capsule 0  . CALQUENCE 100 MG capsule TAKE 100 MG BY MOUTH 2 (TWO) TIMES DAILY. 60 capsule 11  . Cholecalciferol (VITAMIN D-1000 MAX ST) 1000 units tablet Take 1,000 Units by mouth daily.     . diazepam (VALIUM) 10 MG tablet Take 1-2 tablets 30 minutes prior to MRI, may repeat once as needed. Must have driver. 3 tablet 0  . famotidine (PEPCID) 20 MG tablet TAKE 1 TABLET BY MOUTH TWICE A DAY 180 tablet 3  . fluticasone (FLONASE) 50 MCG/ACT nasal spray Place 2 sprays into the nose daily.     . fluticasone furoate-vilanterol (BREO ELLIPTA) 200-25 MCG/INH AEPB Inhale 1 puff into the lungs daily. 1 each 5  . furosemide (LASIX) 20 MG tablet TAKE 1 TABLET BY MOUTH EVERY DAY AS NEEDED 90 tablet 2  . HYDROcodone-acetaminophen (NORCO) 10-325 MG tablet Take 1 tablet by mouth See admin instructions. Every 6 to 8 hours as needed for pain  0  . lidocaine-prilocaine (EMLA) cream APPLY TO AFFECTED AREA ONCE 30 g 3  . loperamide (IMODIUM) 2 MG capsule Take 2 capsules (4 mg total) by mouth every 6 (six) hours as needed for diarrhea or loose stools. 90 capsule 2  . meclizine (ANTIVERT) 12.5 MG tablet Take 12.5 mg by mouth every 6 (six) hours as needed for dizziness.   0  . metFORMIN (GLUCOPHAGE-XR) 500 MG 24 hr tablet Take 500 mg by mouth See admin instructions. Takes one tablet in AM and 2 tablets at night.    . montelukast (SINGULAIR) 10 MG tablet Take 1 tablet (10 mg total) by mouth daily. 30 tablet 5  . nitroGLYCERIN (NITROSTAT) 0.4 MG SL tablet PLACE 1 TABLET UNDER THE TONGUE  EVERY 5 MINUTES X 3 DOSES AS NEEDED FOR CHEST PAIN *MAX 3 DOSES* 25 tablet 3  . pregabalin (LYRICA) 200 MG capsule TAKE 1 CAPSULE (200 MG TOTAL) BY MOUTH 3 (THREE) TIMES DAILY. 270 capsule 1  . propranolol (INDERAL) 20 MG tablet Take 1 tablet (20 mg total) by mouth 2 (two) times daily. 60 tablet 6  . rosuvastatin (CRESTOR) 5 MG tablet TAKE 1 TABLET BY MOUTH EVERY DAY 90 tablet 3  . sildenafil (VIAGRA) 100 MG tablet Take 0.5-1 tablets (50-100 mg total) by mouth as needed for erectile dysfunction. 10 tablet 3  . zolpidem (AMBIEN) 10 MG tablet Take 10 mg by mouth at bedtime as needed for sleep.     No current facility-administered medications for this visit.   Facility-Administered Medications Ordered in Other Visits  Medication Dose Route Frequency Provider Last Rate Last Admin  . 0.9 %  sodium chloride infusion   Intravenous Continuous [name], MD 50 mL/hr at 03/07/14 1005 New Bag at 03/07/14 1005  . sodium chloride 0.9 % injection 10 mL  10 mL Intracatheter PRN Marcy Panning, MD   10 mL at 08/22/12 1721    PHYSICAL EXAMINATION: ECOG PERFORMANCE STATUS: 1 - Symptomatic but completely ambulatory  Vitals:   10/25/19 0840  BP: (!) 108/58  Pulse: 72  Resp: 18  Temp: 98.6 F (37 C)  SpO2: 100%   Filed Weights   10/25/19 0840  Weight: 170 lb 12.8 oz (77.5 kg)    GENERAL:alert, no distress and comfortable SKIN: He has significant skin bruises EYES: normal, Conjunctiva are pink and non-injected, sclera clear OROPHARYNX:no exudate, no erythema and lips, buccal mucosa, and tongue normal  NECK: supple, thyroid normal size, non-tender, without nodularity LYMPH:  no palpable lymphadenopathy in the cervical, axillary or inguinal LUNGS: clear to auscultation and percussion with normal breathing effort HEART: regular rate & rhythm and no murmurs and no lower extremity edema ABDOMEN:abdomen soft, non-tender and normal bowel sounds Musculoskeletal:no cyanosis of digits and no clubbing   NEURO: alert & oriented x 3 with fluent speech, no focal motor/sensory deficits  LABORATORY DATA:  I have reviewed the data as listed    Component Value Date/Time   NA 140 10/25/2019 0819   NA 142 06/19/2019 0848   NA 136 01/26/2017 0940   K 4.3 10/25/2019 0819   K 4.0 01/26/2017 0940   CL 106 10/25/2019 0819   CL 104 08/03/2012 1229   CO2 28 10/25/2019 0819   CO2 23 01/26/2017 0940   GLUCOSE 91 10/25/2019 0819   GLUCOSE 146 (H) 01/26/2017 0940   GLUCOSE 223 (H) 08/03/2012 1229   BUN 14 10/25/2019 0819   BUN 13 06/19/2019 0848   BUN 9.6 01/26/2017 0940   CREATININE 0.75 10/25/2019 0819   CREATININE 1.04 04/24/2018 0821   CREATININE 1.0 01/26/2017 0940   CALCIUM 8.5 (L) 10/25/2019 0819   CALCIUM 8.7 01/26/2017 0940   PROT 5.3 (L) 10/25/2019 0819   PROT 5.9 (L) 04/13/2017 0823   PROT 5.7 (L) 01/26/2017 0940   ALBUMIN 3.3 (L) 10/25/2019 0819   ALBUMIN 3.6 01/26/2017 0940   AST 8 (L) 10/25/2019 0819   AST 13 (L) 04/24/2018 0821   AST 13 01/26/2017 0940   ALT <6 10/25/2019 0819   ALT 10 04/24/2018 0821   ALT 12 01/26/2017 0940   ALKPHOS 93 10/25/2019 0819   ALKPHOS 78 01/26/2017 0940   BILITOT 0.5 10/25/2019 0819   BILITOT 0.7 04/24/2018 0821   BILITOT 0.64 01/26/2017 0940   GFRNONAA >60 10/25/2019 0819   GFRNONAA >60 04/24/2018 0821   GFRAA >60 10/25/2019 0819   GFRAA >60 04/24/2018 0821    No results found for: SPEP, UPEP  Lab Results  Component Value Date   WBC 4.1 10/25/2019   NEUTROABS 3.2 10/25/2019   HGB 12.3 (L) 10/25/2019   HCT 37.4 (L) 10/25/2019   MCV 99.2 10/25/2019   PLT 66 (L) 10/25/2019      Chemistry      Component Value Date/Time   NA 140 10/25/2019 0819   NA 142 06/19/2019 0848   NA 136 01/26/2017 0940   K 4.3 10/25/2019 0819   K 4.0 01/26/2017 0940   CL 106 10/25/2019 0819   CL 104 08/03/2012 1229   CO2 28 10/25/2019 0819   CO2 23  01/26/2017 0940   BUN 14 10/25/2019 0819   BUN 13 06/19/2019 0848   BUN 9.6 01/26/2017 0940    CREATININE 0.75 10/25/2019 0819   CREATININE 1.04 04/24/2018 0821   CREATININE 1.0 01/26/2017 0940      Component Value Date/Time   CALCIUM 8.5 (L) 10/25/2019 0819   CALCIUM 8.7 01/26/2017 0940   ALKPHOS 93 10/25/2019 0819   ALKPHOS 78 01/26/2017 0940   AST 8 (L) 10/25/2019 0819   AST 13 (L) 04/24/2018 0821   AST 13 01/26/2017 0940   ALT <6 10/25/2019 0819   ALT 10 04/24/2018 0821   ALT 12 01/26/2017 0940   BILITOT 0.5 10/25/2019 0819   BILITOT 0.7 04/24/2018 0821   BILITOT 0.64 01/26/2017 0940

## 2019-10-25 NOTE — Assessment & Plan Note (Signed)
He complained of excessive fatigue I will check his TSH in his next visit

## 2019-10-25 NOTE — Telephone Encounter (Signed)
Scheduled appts per 9/10 sch msg. Pt confirmed appt date and time.

## 2019-11-03 ENCOUNTER — Ambulatory Visit
Admission: RE | Admit: 2019-11-03 | Discharge: 2019-11-03 | Disposition: A | Discharge status: Home / Self Care | Payer: Medicare Other | Source: Ambulatory Visit | Attending: Neurology | Admitting: Neurology

## 2019-11-03 ENCOUNTER — Other Ambulatory Visit: Payer: Self-pay

## 2019-11-03 DIAGNOSIS — R42 Dizziness and giddiness: Secondary | ICD-10-CM

## 2019-11-03 DIAGNOSIS — R269 Unspecified abnormalities of gait and mobility: Secondary | ICD-10-CM

## 2019-11-03 DIAGNOSIS — G629 Polyneuropathy, unspecified: Secondary | ICD-10-CM

## 2019-11-03 DIAGNOSIS — H814 Vertigo of central origin: Secondary | ICD-10-CM

## 2019-11-03 MED ORDER — GADOBENATE DIMEGLUMINE 529 MG/ML IV SOLN
16.0000 mL | Freq: Once | INTRAVENOUS | Status: AC | PRN
  Administered 2019-11-03: 16 mL via INTRAVENOUS

## 2019-11-04 ENCOUNTER — Telehealth: Payer: Self-pay | Admitting: Neurology

## 2019-11-04 NOTE — Telephone Encounter (Signed)
IMPRESSION: This MRI of the brain with and without contrast shows the following: 1.   Scattered T2/FLAIR hyperintense foci in the hemispheres most consistent with moderate chronic microvascular ischemic changes. None of the foci appear to be acute and they did not enhance. 2.   Mild generalized cortical atrophy, progressed compared to the 2014 CT scan and most noted in the posterior left frontal/parietal lobe. 3.   Mucoperiosteal thickening in the maxillary and ethmoid sinuses consistent with chronic sinusitis. 4.   Normal enhancement pattern and no acute findings. 5.   Internal auditory canals appeared normal. IMPRESSION: This MR angiogram of the neck arteries shows the following: 1.   50% stenosis of the left internal carotid artery at the bifurcation. 2.   10 to 15% stenosis of the right internal carotid artery at the bifurcation. This is unlikely to be hemodynamically significant. 3.   The vertebral arteries have no stenosis.  IMPRESSION: This is a normal MR angiogram of the intracranial arteries.   Please call patient, MRI of the brain showed mild age-related changes, scattered to moderate supratentorium small vessel disease  MRA of the neck: Left internal carotid artery 50% stenosis, mild 10 to 15% stenosis of right internal carotid artery  MRAof the brain showed no large vessel disease  Keep aspirin 81 mg daily

## 2019-11-08 ENCOUNTER — Other Ambulatory Visit: Payer: Self-pay

## 2019-11-08 ENCOUNTER — Ambulatory Visit (INDEPENDENT_AMBULATORY_CARE_PROVIDER_SITE_OTHER): Payer: Medicare Other | Admitting: Internal Medicine

## 2019-11-08 ENCOUNTER — Encounter: Payer: Self-pay | Admitting: Internal Medicine

## 2019-11-08 VITALS — BP 90/50 | HR 61 | Temp 95.2°F | Ht 72.0 in | Wt 167.4 lb

## 2019-11-08 DIAGNOSIS — J449 Chronic obstructive pulmonary disease, unspecified: Secondary | ICD-10-CM

## 2019-11-08 DIAGNOSIS — J310 Chronic rhinitis: Secondary | ICD-10-CM

## 2019-11-08 DIAGNOSIS — J4489 Other specified chronic obstructive pulmonary disease: Secondary | ICD-10-CM

## 2019-11-08 DIAGNOSIS — R053 Chronic cough: Secondary | ICD-10-CM

## 2019-11-08 DIAGNOSIS — R05 Cough: Secondary | ICD-10-CM | POA: Diagnosis not present

## 2019-11-08 MED ORDER — MOMETASONE FUROATE 50 MCG/ACT NA SUSP: 2.0000 | Freq: Every day | NASAL | 11 refills | Status: DC

## 2019-11-08 NOTE — Progress Notes (Signed)
Ryan Copeland    [id number]    04-Aug-1950  Primary Care Physician:Kaplan, Baldemar Friday., PA-C Date of Appointment: 11/08/2019 Established Patient Visit  Chief complaint:   Chief Complaint  Patient presents with  . Follow-up    dry cough and productive cough, phlem white and yellow.  no improvement with inhalers.      HPI: Ryan Copeland is a 69 y.o. man with emphysema and rhinitis.   Interval Updates: Had been on Breo since last visit. No improvement with that or symbicort. Albuterol also doesn't help cough.  Still having cough which is waking him up at night. He takes an OTC cough suppressant which helps. He denies overt reflux or heart burn. History of esophageal candidiasis which was treated with fluconazole several years ago.   He doesn't feel like the cough is coming from his lungs, but more the back of this throat. He is having difficulty with swallowing.   He previously was on a PPI but is unable to take these due to his Calquence for CLL.   Stopped taking flonase due to soreness in the nose. Still taking singulair isn't sure if it's helping.   No hospitalizations of ED visits.   I have reviewed the patient's family social and past medical history and updated as appropriate.   Past Medical History:  Diagnosis Date  . Anxiety 01/30/2014  . Back injury    lower disc  . CAD (coronary artery disease)   . CLL (chronic lymphocytic leukemia) ([place]) 03/18/2011  . COPD (chronic obstructive pulmonary disease) ([place])   . Diabetes mellitus ([place])    Type 2   . ECRB (extensor carpi radialis brevis) tenosynovitis   . GERD (gastroesophageal reflux disease)    takes Nexium if needed  . Hyperlipidemia   . Lateral epicondylitis of left elbow   . MI, acute, non ST segment elevation ([place]) 06/28/2009   with stenting of the LAD  . Neuromuscular disorder ([place])    peripheral neuropathy  . PVD (peripheral vascular disease) ([place])   . Tobacco abuse     Past Surgical History:    Procedure Laterality Date  . ADENOIDECTOMY  1955  . CARDIAC CATHETERIZATION    . CARDIAC CATHETERIZATION N/A 09/26/2014   Procedure: Left Heart Cath and Coronary Angiography;  Surgeon: Peter M Martinique, MD;  Location: [facility] [facility] CV LAB;  Service: Cardiovascular;  Laterality: N/A;  . carpel tunnel release Left 04-1989  . carpel tunnel release  Right 01-1989  . CHOLECYSTECTOMY  2007  . CORONARY STENT PLACEMENT  May 2011  . femoral stents    . IR CV LINE INJECTION  08/18/2017  . IR CV LINE INJECTION  09/01/2017  . IR CV LINE INJECTION  02/02/2018  . LATERAL EPICONDYLE RELEASE Left 02/12/2014   Procedure: LEFT ELBOW DEBRIDEMENT WITH TENDON REPAIR ;  Surgeon: Lorn Junes, MD;  Location: [facility];  Service: Orthopedics;  Laterality: Left;  . LEFT CAI STENT/PTA AND POPLITEAL ARTERY/TIBIAL THROMBECTOMY     . LEFT HEART CATHETERIZATION WITH CORONARY ANGIOGRAM N/A 08/26/2011   Procedure: LEFT HEART CATHETERIZATION WITH CORONARY ANGIOGRAM;  Surgeon: Peter M Martinique, MD;  Location: [facility] C[facility] LAB;  Service: Cardiovascular;  Laterality: N/A;  . PERIPHERAL VASCULAR CATHETERIZATION N/A 01/01/2015   Procedure: Abdominal Aortogram;  Surgeon: Conrad Cottonwood, MD;  Location: [facility] [place] CV LAB;  Service: Cardiovascular;  Laterality: N/A;  . TARSAL TUNNEL RELEASE Bilateral 08-2007    Family History  Problem Relation  Age of Onset  . Lung cancer Mother 44  . Cancer Mother        lung  . Heart failure Father 58  . Heart disease Father     Social History   Occupational History    Employer: [employer]    Comment: [employer]  Tobacco Use  . Smoking status: Former Smoker    Packs/day: 1.00    Years: 44.00    Pack years: 44.00    Types: Cigarettes    Quit date: 04/23/2016    Years since quitting: 3.5  . Smokeless tobacco: Former Network engineer  . Vaping Use: Never used  Substance and Sexual Activity  . Alcohol use: No    Alcohol/week: 0.0 standard drinks    Comment:  drinks non-alcoholic  beer  . Drug use: No  . Sexual activity: Yes     Physical Exam: Blood pressure (!) 90/50, pulse 61, temperature (!) 95.2 F (35.1 C), temperature source Temporal, height 6' (1.829 m), weight 167 lb 6.4 oz (75.9 kg), SpO2 100 %.  Gen:      No acute distress Lungs:    No increased respiratory effort, symmetric chest wall excursion, clear to auscultation bilaterally, no wheezes or crackles CV:         Regular rate and rhythm; no murmurs, rubs, or gallops.  No pedal edema   Data Reviewed: Imaging: I have personally reviewed the CT Chest from 2020 which demonstrates mild centrilobular emphysema with peribronchial thickening.   PFTs: I have personally reviewed the patient's PFTs and spirometry is normal. No BD response. FeNO 57 ppb.  PFTs:  PFT Results Latest Ref Rng & Units 06/27/2019  FVC-Pre L 3.62  FVC-Predicted Pre % 77  FVC-Post L 3.70  FVC-Predicted Post % 79  Pre FEV1/FVC % % 75  Post FEV1/FCV % % 76  FEV1-Pre L 2.72  FEV1-Predicted Pre % 78  FEV1-Post L 2.82    Labs: Lab Results  Component Value Date   NA 140 10/25/2019   K 4.3 10/25/2019   CL 106 10/25/2019   CO2 28 10/25/2019   Lab Results  Component Value Date   WBC 4.1 10/25/2019   HGB 12.3 (L) 10/25/2019   HCT 37.4 (L) 10/25/2019   MCV 99.2 10/25/2019   PLT 66 (L) 10/25/2019   Immunization status: Immunization History  Administered Date(s) Administered  . Fluad Quad(high Dose 65+) 11/23/2018  . Influenza,inj,Quad PF,6+ Mos 11/13/2015, 11/03/2016  . Influenza-Unspecified 11/12/2013, 12/04/2014, 11/08/2017  . PFIZER SARS-COV-2 Vaccination 10/02/2019, 10/23/2019  . Pneumococcal Conjugate-13 01/02/2015  . Pneumococcal Polysaccharide-23 06/26/2013, 05/16/2017    Assessment:  Chronic CLL - on Acalabrutinib Centrilobular Emphysema  History of Tobacco Use Disorder Seasonal allergic rhinitis Chronic Cough - UACS   Plan/Recommendations: Suspect symptoms are a combination of GERD with Rhinitis.  Stopped using flonase due irritation in his nose, switch to mometasone and give that a try. Recommend water based lubricant in nares for moisture - nothing petroleum based. Continue singulair. Stop using inhalers for now if he has no benefit.   He does have an elevated FeNO which is either a false positive or related to rhinitis.  Follow up with GI regarding possibility of candidiasis. Start up H2 blocker again. Cannot tolerate PPI due to CLL treatment. Sleep with Wedge pillow.  Consider CT sinus and ENT referral if not improving.   Return to Care: Return in about 3 months (around 02/07/2020).   Lenice Llamas, MD Pulmonary and [place]  Office:[phone]

## 2019-11-08 NOTE — Patient Instructions (Addendum)
Stop taking inhalers if they are not helping you. Continue singulair. Start taking pepcid again once a day (2 hours before your Calquence.) Use a water-based lubricant in your nose instead of oil based lubricant.  Follow with GI doctor about your esophagus.  Try sleeping with a wedge pillow.  Switch from flonase to nasacort.   What is GERD? Gastroesophageal reflux disease (GERD) is gastroesophageal reflux diseasewhich occurs when the lower esophageal sphincter (LES) opens spontaneously, for varying periods of time, or does not close properly and stomach contents rise up into the esophagus. GER is also called acid reflux or acid regurgitation, because digestive juices--called acids--rise up with the food. The esophagus is the tube that carries food from the mouth to the stomach. The LES is a ring of muscle at the bottom of the esophagus that acts like a valve between the esophagus and stomach.  When acid reflux occurs, food or fluid can be tasted in the back of the mouth. When refluxed stomach acid touches the lining of the esophagus it may cause a burning sensation in the chest or throat called heartburn or acid indigestion. Occasional reflux is common. Persistent reflux that occurs more than twice a week is considered GERD, and it can eventually lead to more serious health problems. People of all ages can have GERD. Studies have shown that GERD may worsen or contribute to asthma, chronic cough, and pulmonary fibrosis.   What are the symptoms of GERD? The main symptom of GERD in adults is frequent heartburn, also called acid indigestion--burning-type pain in the lower part of the mid-chest, behind the breast bone, and in the mid-abdomen.  Not all reflux is acidic in nature, and many patients don't have heart burn at all. Sometimes it feels like a cough (either dry or with mucus), choking sensation, asthma, shortness of breath, waking up at night, frequent throat clearing, or trouble swallowing.     What causes GERD? The reason some people develop GERD is still unclear. However, research shows that in people with GERD, the LES relaxes while the rest of the esophagus is working. Anatomical abnormalities such as a hiatal hernia may also contribute to GERD. A hiatal hernia occurs when the upper part of the stomach and the LES move above the diaphragm, the muscle wall that separates the stomach from the chest. Normally, the diaphragm helps the LES keep acid from rising up into the esophagus. When a hiatal hernia is present, acid reflux can occur more easily. A hiatal hernia can occur in people of any age and is most often a normal finding in otherwise healthy people over age 65. Most of the time, a hiatal hernia produces no symptoms.   Other factors that may contribute to GERD include - Obesity or recent weight gain - Pregnancy  - Smoking  - Diet - Certain medications  Common foods that can worsen reflux symptoms include: - carbonated beverages - artificial sweeteners - citrus fruits  - chocolate  - drinks with caffeine or alcohol  - fatty and fried foods  - garlic and onions  - mint flavorings  - spicy foods  - tomato-based foods, like spaghetti sauce, salsa, chili, and pizza   Lifestyle Changes If you smoke, stop.  Avoid foods and beverages that worsen symptoms (see above.) Lose weight if needed.  Eat small, frequent meals.  Wear loose-fitting clothes.  Avoid lying down for 3 hours after a meal.  Raise the head of your bed 6 to 8 inches by securing wood blocks  under the bedposts. Just using extra pillows will not help, but using a wedge-shaped pillow may be helpful.  Medications  H2 blockers, such as cimetidine (Tagamet HB), famotidine (Pepcid AC), nizatidine (Axid AR), and ranitidine (Zantac 75), decrease acid production. They are available in prescription strength and over-the-counter strength. These drugs provide short-term relief and are effective for about half of those  who have GERD symptoms.  Proton pump inhibitors include omeprazole (Prilosec, Zegerid), lansoprazole (Prevacid), pantoprazole (Protonix), rabeprazole (Aciphex), and esomeprazole (Nexium), which are available by prescription. Prilosec is also available in over-the-counter strength. Proton pump inhibitors are more effective than H2 blockers and can relieve symptoms and heal the esophageal lining in almost everyone who has GERD.  Because drugs work in different ways, combinations of medications may help control symptoms. People who get heartburn after eating may take both antacids and H2 blockers. The antacids work first to neutralize the acid in the stomach, and then the H2 blockers act on acid production. By the time the antacid stops working, the H2 blocker will have stopped acid production. Your health care provider is the best source of information about how to use medications for GERD.   Points to Remember 1. You can have GERD without having heartburn. Your symptoms could include a dry cough, asthma symptoms, or trouble swallowing.  2. Taking medications daily as prescribed is important in controlling you symptoms.  Sometimes it can take up to 8 weeks to fully achieve the effects of the medications prescribed.  3. Coughing related to GERD can be difficult to treat and is very frustrating!  However, it is important to stick with these medications and lifestyle modifications before pursuing more aggressive or invasive test and treatments.

## 2019-11-14 ENCOUNTER — Telehealth: Payer: Self-pay | Admitting: Internal Medicine

## 2019-11-14 DIAGNOSIS — R053 Chronic cough: Secondary | ICD-10-CM

## 2019-11-14 MED ORDER — BENZONATATE 100 MG PO CAPS: 200.0000 mg | ORAL_CAPSULE | Freq: Three times a day (TID) | ORAL | 2 refills | Status: DC | PRN

## 2019-11-14 NOTE — Telephone Encounter (Addendum)
Called and spoke with patient to let him know of Dr. Mauricio Po recs and that RX was sent in. He states that he has followed up with GI DR. Hung with [facility] and he is currently on fluconazole and has 6 more days left. He states that is this did not help him improve then they are going to do an Endoscopy. Will route this to Dr. Shearon Stalls as Juluis Rainier

## 2019-11-14 NOTE — Telephone Encounter (Signed)
Spoke with patient regarding prior message. Patient stated he wanted his Tessaslon medication sent into [facility] in [place]. Patient is aware and voice was understanding. Nothing else further needed.

## 2019-11-14 NOTE — Telephone Encounter (Signed)
Please have the patient follow up with gastroenterology like we discussed at his last visit because I think his symptoms may be related to reflux. I have prescribed some lozenges for his cough.

## 2019-11-14 NOTE — Telephone Encounter (Signed)
Called and spoke with patient he states he has a productive cough with yellow sputum and states it lasts all day long nothing makes it worse. Denies fevers. Patient is taking OTC generic Nyquil cough syrup.  He states that him and his wife are having trouble sleeping and he is miserable.   Dr. Shearon Stalls please advise

## 2019-11-15 DIAGNOSIS — J189 Pneumonia, unspecified organism: Secondary | ICD-10-CM

## 2019-11-15 DIAGNOSIS — Z978 Presence of other specified devices: Secondary | ICD-10-CM

## 2019-11-15 HISTORY — DX: Presence of other specified devices: Z97.8

## 2019-11-15 HISTORY — DX: Pneumonia, unspecified organism: J18.9

## 2019-11-21 MED FILL — CALQUENCE 100 MG CAPSULE: 100 | 30 days supply | Qty: 60 | Fill #8

## 2019-11-25 ENCOUNTER — Ambulatory Visit (INDEPENDENT_AMBULATORY_CARE_PROVIDER_SITE_OTHER): Payer: Medicare Other | Admitting: Neurology

## 2019-11-25 DIAGNOSIS — R202 Paresthesia of skin: Secondary | ICD-10-CM | POA: Diagnosis not present

## 2019-11-25 DIAGNOSIS — R269 Unspecified abnormalities of gait and mobility: Secondary | ICD-10-CM

## 2019-11-25 DIAGNOSIS — I259 Chronic ischemic heart disease, unspecified: Secondary | ICD-10-CM | POA: Diagnosis not present

## 2019-11-25 DIAGNOSIS — G6181 Chronic inflammatory demyelinating polyneuritis: Secondary | ICD-10-CM | POA: Diagnosis not present

## 2019-11-25 DIAGNOSIS — I679 Cerebrovascular disease, unspecified: Secondary | ICD-10-CM | POA: Insufficient documentation

## 2019-11-25 DIAGNOSIS — G629 Polyneuropathy, unspecified: Secondary | ICD-10-CM

## 2019-11-25 DIAGNOSIS — R42 Dizziness and giddiness: Secondary | ICD-10-CM

## 2019-11-25 DIAGNOSIS — H814 Vertigo of central origin: Secondary | ICD-10-CM

## 2019-11-25 NOTE — Progress Notes (Signed)
HISTORY OF PRESENT ILLNESS: 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, chronic lymphocytic leukemia, no evidence of solid organ involvement, he received chemotherapy FCR x6 cycles from May 2014 to October 2014, chemotherapyBendamustineand and rituximab from October 06, 2016 to February 24, 2017, acalabrutinib since April 16 2018  Most recent CAT scan on March 23, 8411, no hypermetabolic adenopathy identified within the neck, chest, abdomen or pelvic, prominent left retroperitoneal lymph node measuring 1.6 cm without significant FDG uptake, splenomegaly,  He has diabetes since 2012, still works as an Chief Financial Officer,  Hisdiabetic symptoms started around 2009, he noticed bilateral feet numbness tingling, at the bottom of his feet, actually underwent bilateral tarsal tunnel release surgery did not help his symptoms, reported abnormal EMG nerve conduction study in the past from outside office, never had a repeat study in the laboratory,  History of vitamin B12 deficiency has been on vitamin B12 IM supplement in the past, switch to p.o. later  He has tried and failed gabapentin, Cymbalta, Topamax, currently taking Lyrica 100 mg 3 times a day +200 mg at bedtime, and gabapentin 300 mg twice a day.  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 continue complains of bilateral toes and feet paresthesia,  burning pain, Lyrica 200 mg 3 times a day, works better for him, then gabapentin, previously tried and failed nortriptyline, gabapentin, Cymbalta, Topamax, Keppra 250 mg twice a day since June 2021, could not tolerated due to side effect of drowsiness, stopped taking it  Today his main concern is new onset dizziness since 2021, is mostly positional changes, it often triggered by hyperextension of his neck, sudden positional movement, sometimes when he lies down, or getting up, he described transient room spinning sensation, lasting less than 1 minute,  Review history, he had a history of peripheral vascular disease in the past, was seen by vascular surgery in 2017, status post left iliac PTA stent, left popliteal artery thrombectomy by Dr. Yolanda Copeland, is on antiplatelet agent aspirin 81 mg daily  He retired from Pharmacist, community, works with heavy equipment, had a history of loud noise exposure, no complaints of high-pitched bilateral tinnitus, decreased he  Lab Evaluations in September 2021: CMP, potassium 3.3, creatinine 0.6, hemoglobin of 13.6, RDW was elevated at 16.5, lipid panel, LDL 33 CT abdomen, chest in 2020, significant improvement in abdominal adenopathy, largest remaining lymph node measuring 1.3 cm in the left retroperitoneal region, stable appearance of 5 mm right midlung nodule, aortic atherosclerosis, and coronary artery calcification, emphysema  UPDATE Nov 25 2019: He return for electrodiagnostic study today, which showed evidence of demyelinating polyradiculoneuropathy, as evidenced by prolonged distal latency on all of the motor nerves tested, slow conduction velocity in the range of 31 m/s, prolonged F-wave latency of time (tested. 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  Burning  ALLERGIES: Allergies  Allergen Reactions  . Codeine Hives    Pt states he can take a few, more reaction with extended doses.    HOME MEDICATIONS: Outpatient Medications Prior to Visit  Medication Sig Dispense Refill  . acyclovir (ZOVIRAX) 400 MG tablet TAKE 1 TABLET BY MOUTH TWICE A DAY 180 tablet 1  . albuterol (PROVENTIL HFA) 108 (90 Base) MCG/ACT inhaler TAKE 2 PUFFS BY MOUTH EVERY 6 HOURS AS NEEDED FOR WHEEZE OR SHORTNESS OF BREATH 18 g 11  . allopurinol (ZYLOPRIM) 300 MG tablet TAKE 1 TABLET BY MOUTH EVERY DAY 90 tablet 1  . ALPRAZolam (XANAX) 0.25 MG tablet Take 0.5 mg by mouth as needed for anxiety.     Marland Kitchen b complex vitamins tablet Take 1 tablet by mouth daily.     . benzonatate (TESSALON) 100 MG capsule Take 2 capsules (200 mg total) by mouth 3 (three) times daily as needed for cough. 30 capsule 2  . CALQUENCE 100 MG capsule TAKE 100 MG BY MOUTH 2 (TWO) TIMES DAILY. 60 capsule 11  . Cholecalciferol (VITAMIN D-1000 MAX ST) 1000 units tablet Take 1,000 Units by mouth daily.     . famotidine (PEPCID) 20 MG tablet TAKE 1 TABLET BY MOUTH TWICE A DAY 180 tablet 3  . fluticasone furoate-vilanterol (BREO ELLIPTA) 200-25 MCG/INH AEPB Inhale 1 puff into the lungs daily. 1 each 5  . furosemide (LASIX) 20 MG tablet TAKE 1 TABLET BY MOUTH EVERY DAY AS NEEDED 90 tablet 2  . HYDROcodone-acetaminophen (NORCO) 10-325 MG tablet Take 1 tablet by mouth See admin instructions. Every 6 to 8 hours as  needed for pain  0  . lidocaine-prilocaine (EMLA) cream APPLY TO AFFECTED AREA ONCE 30 g 3  . loperamide (IMODIUM) 2 MG capsule Take 2 capsules (4 mg total) by mouth every 6 (six) hours as needed for diarrhea or loose stools. 90 capsule 2  . meclizine (ANTIVERT) 12.5 MG tablet Take 12.5 mg by mouth every 6 (six) hours as needed for dizziness.   0  . metFORMIN (GLUCOPHAGE-XR) 500 MG 24 hr tablet Take 500 mg by mouth See admin instructions. Takes one tablet in AM and 2 tablets at night.    . mometasone (NASONEX) 50 MCG/ACT nasal spray Place 2 sprays into the nose daily. 1 each 11  . montelukast (SINGULAIR) 10 MG tablet Take 1 tablet (10 mg total) by mouth daily. 30 tablet 5  . nitroGLYCERIN (NITROSTAT) 0.4 MG SL tablet PLACE 1 TABLET UNDER THE TONGUE EVERY 5 MINUTES X 3 DOSES AS NEEDED FOR CHEST PAIN *MAX 3 DOSES* 25 tablet 3  . pregabalin (LYRICA) 200 MG capsule TAKE 1 CAPSULE (200 MG TOTAL) BY MOUTH 3 (THREE) TIMES DAILY. 270 capsule 1  . propranolol (INDERAL) 20 MG tablet Take 1 tablet (20 mg total) by mouth 2 (two) times daily. 60 tablet 6  . rosuvastatin (CRESTOR) 5 MG tablet TAKE 1 TABLET BY MOUTH EVERY DAY 90 tablet 3  . sildenafil (  VIAGRA) 100 MG tablet Take 0.5-1 tablets (50-100 mg total) by mouth as needed for erectile dysfunction. 10 tablet 3  . zolpidem (AMBIEN) 10 MG tablet Take 10 mg by mouth at bedtime as needed for sleep.     Facility-Administered Medications Prior to Visit  Medication Dose Route Frequency Provider Last Rate Last Admin  . 0.9 %  sodium chloride infusion   Intravenous Continuous [name], MD 50 mL/hr at 03/07/14 1005 New Bag at 03/07/14 1005  . sodium chloride 0.9 % injection 10 mL  10 mL Intracatheter PRN Marcy Panning, MD   10 mL at 08/22/12 1721    PAST MEDICAL HISTORY: Past Medical History:  Diagnosis Date  . Anxiety 01/30/2014  . Back injury    lower disc  . CAD (coronary artery disease)   . CLL (chronic lymphocytic leukemia) ([place]) 03/18/2011  . COPD  (chronic obstructive pulmonary disease) ([place])   . Diabetes mellitus ([place])    Type 2   . ECRB (extensor carpi radialis brevis) tenosynovitis   . GERD (gastroesophageal reflux disease)    takes Nexium if needed  . Hyperlipidemia   . Lateral epicondylitis of left elbow   . MI, acute, non ST segment elevation ([place]) 06/28/2009   with stenting of the LAD  . Neuromuscular disorder ([place])    peripheral neuropathy  . PVD (peripheral vascular disease) ([place])   . Tobacco abuse     PAST SURGICAL HISTORY: Past Surgical History:  Procedure Laterality Date  . ADENOIDECTOMY  1955  . CARDIAC CATHETERIZATION    . CARDIAC CATHETERIZATION N/A 09/26/2014   Procedure: Left Heart Cath and Coronary Angiography;  Surgeon: Peter M Martinique, MD;  Location: [facility];  Service: Cardiovascular;  Laterality: N/A;  . carpel tunnel release Left 04-1989  . carpel tunnel release  Right 01-1989  . CHOLECYSTECTOMY  2007  . CORONARY STENT PLACEMENT  May 2011  . femoral stents    . IR CV LINE INJECTION  08/18/2017  . IR CV LINE INJECTION  09/01/2017  . IR CV LINE INJECTION  02/02/2018  . LATERAL EPICONDYLE RELEASE Left 02/12/2014   Procedure: LEFT ELBOW DEBRIDEMENT WITH TENDON REPAIR ;  Surgeon: Lorn Junes, MD;  Location: [facility];  Service: Orthopedics;  Laterality: Left;  . LEFT CAI STENT/PTA AND POPLITEAL ARTERY/TIBIAL THROMBECTOMY     . LEFT HEART CATHETERIZATION WITH CORONARY ANGIOGRAM N/A 08/26/2011   Procedure: LEFT HEART CATHETERIZATION WITH CORONARY ANGIOGRAM;  Surgeon: Peter M Martinique, MD;  Location: [facility];  Service: Cardiovascular;  Laterality: N/A;  . PERIPHERAL VASCULAR CATHETERIZATION N/A 01/01/2015   Procedure: Abdominal Aortogram;  Surgeon: Conrad Ivey, MD;  Location: [facility];  Service: Cardiovascular;  Laterality: N/A;  . TARSAL TUNNEL RELEASE Bilateral 08-2007    FAMILY HISTORY: Family History  Problem Relation Age of Onset  . Lung cancer Mother 82  . Cancer Mother         lung  . Heart failure Father 23  . Heart disease Father     SOCIAL HISTORY: Social History   Socioeconomic History  . Marital status: Married    Spouse name: [name]  . Number of children: 1  . Years of education: College  . Highest education level: Not on file  Occupational History    Employer: [employer]    Comment: [employer]  Tobacco Use  . Smoking status: Former Smoker    Packs/day: 1.00    Years: 44.00    Pack years: 44.00  Types: Cigarettes    Quit date: 04/23/2016    Years since quitting: 3.5  . Smokeless tobacco: Former Network engineer  . Vaping Use: Never used  Substance and Sexual Activity  . Alcohol use: No    Alcohol/week: 0.0 standard drinks    Comment:  drinks non-alcoholic beer  . Drug use: No  . Sexual activity: Yes  Other Topics Concern  . Not on file  Social History Narrative   Patient lives at home with wife.   Caffeine Use: 15 cups of caffeine weekly   Social Determinants of Health   Financial Resource Strain:   . Difficulty of Paying Living Expenses: Not on file  Food Insecurity:   . Worried About Charity fundraiser in the Last Year: Not on file  . Ran Out of Food in the Last Year: Not on file  Transportation Needs:   . Lack of Transportation (Medical): Not on file  . Lack of Transportation (Non-Medical): Not on file  Physical Activity:   . Days of Exercise per Week: Not on file  . Minutes of Exercise per Session: Not on file  Stress:   . Feeling of Stress : Not on file  Social Connections:   . Frequency of Communication with Friends and Family: Not on file  . Frequency of Social Gatherings with Friends and Family: Not on file  . Attends Religious Services: Not on file  . Active Member of Clubs or Organizations: Not on file  . Attends Archivist Meetings: Not on file  . Marital Status: Not on file  Intimate Partner Violence:   . Fear of Current or Ex-Partner: Not on file  . Emotionally Abused: Not on file  .  Physically Abused: Not on file  . Sexually Abused: Not on file  PHYSICAL EXAM  There were no vitals filed for this visit. There is no height or weight on file to calculate BMI.   PHYSICAL EXAMNIATION:  Gen: NAD, conversant, well nourised, well groomed               NEUROLOGICAL EXAM:  MENTAL STATUS: Speech/cognition: Awake alert oriented to history taking and casual conversation   CRANIAL NERVES: CN II: Visual fields are full to confrontation.  Pupils are round equal and briskly reactive to light. CN III, IV, VI: extraocular movement are normal. No ptosis. CN V: Facial sensation is intact to pinprick in all 3 divisions bilaterally. Corneal responses are intact.  CN VII: Face is symmetric with normal eye closure and smile. CN VIII: Hearing is normal to casual conversation CN IX, X: Palate elevates symmetrically. Phonation is normal. CN XI: Head turning and shoulder shrug are intact   MOTOR: Mild bilateral hand posturing tremor, no significant at bilateral upper extremity proximal and distal muscles, no weakness at bilateral lower extremity proximal muscles, mild to moderate bilateral ankle dorsiflexion/toe extension weakness, also has mild bilateral ankle plantarflexion/toe flexion weakness.  REFLEXES: Reflexes are absent   SENSORY: Length dependent decreased vibratory sensation, pinprick to ankle level, decreased toe proprioception, preserved finger proprioception  COORDINATION: Rapid alternating movements and fine finger movements are intact. There is no dysmetria on finger-to-nose and heel-knee-shin.    GAIT/STANCE: He can get up from seated position arm crossed, wide-based, unsteady, bilateral foot drop, could not stand up on tiptoes, or heels, cannot perform tandem walking Romberg is present   DIAGNOSTIC DATA (LABS, IMAGING, TESTING) - I reviewed patient records, labs, notes, testing and imaging myself where available.  Lab Results  Component Value Date   WBC 4.1  10/25/2019   HGB 12.3 (L) 10/25/2019   HCT 37.4 (L) 10/25/2019   MCV 99.2 10/25/2019   PLT 66 (L) 10/25/2019      Component Value Date/Time   NA 140 10/25/2019 0819   NA 142 06/19/2019 0848   NA 136 01/26/2017 0940   K 4.3 10/25/2019 0819   K 4.0 01/26/2017 0940   CL 106 10/25/2019 0819   CL 104 08/03/2012 1229   CO2 28 10/25/2019 0819   CO2 23 01/26/2017 0940   GLUCOSE 91 10/25/2019 0819   GLUCOSE 146 (H) 01/26/2017 0940   GLUCOSE 223 (H) 08/03/2012 1229   BUN 14 10/25/2019 0819   BUN 13 06/19/2019 0848   BUN 9.6 01/26/2017 0940   CREATININE 0.75 10/25/2019 0819   CREATININE 1.04 04/24/2018 0821   CREATININE 1.0 01/26/2017 0940   CALCIUM 8.5 (L) 10/25/2019 0819   CALCIUM 8.7 01/26/2017 0940   PROT 5.3 (L) 10/25/2019 0819   PROT 5.9 (L) 04/13/2017 0823   PROT 5.7 (L) 01/26/2017 0940   ALBUMIN 3.3 (L) 10/25/2019 0819   ALBUMIN 3.6 01/26/2017 0940   AST 8 (L) 10/25/2019 0819   AST 13 (L) 04/24/2018 0821   AST 13 01/26/2017 0940   ALT <6 10/25/2019 0819   ALT 10 04/24/2018 0821   ALT 12 01/26/2017 0940   ALKPHOS 93 10/25/2019 0819   ALKPHOS 78 01/26/2017 0940   BILITOT 0.5 10/25/2019 0819   BILITOT 0.7 04/24/2018 0821   BILITOT 0.64 01/26/2017 0940   GFRNONAA >60 10/25/2019 0819   GFRNONAA >60 04/24/2018 0821   GFRAA >60 10/25/2019 0819   GFRAA >60 04/24/2018 0821   Lab Results  Component Value Date   CHOL 79 (L) 06/19/2019   HDL 34 (L) 06/19/2019   LDLCALC 33 06/19/2019   LDLDIRECT 117.6 08/22/2011   TRIG 44 06/19/2019   CHOLHDL 2.3 06/19/2019   Lab Results  Component Value Date   HGBA1C 6.2 (H) 04/13/2017   Lab Results  Component Value Date   VITAMINB12 554 04/13/2017   Lab Results  Component Value Date   TSH 1.620 04/13/2017      ASSESSMENT AND PLAN 69 y.o. year old male  Peripheral neuropathy, slow onset with neuropathic pain since 2009 Slow onset gait abnormality  Symptom control with Lyrica 200 mg 3 times a day,  Previously tried and  failed gabapentin, Cymbalta, nortriptyline, Keppra, Topamax  EMG nerve conduction study today showed evidence of demyelinating polyradiculoneuropathy,  Continue to progress, with slowly progressive gait abnormality,  Reviewed fluoroscopy guided lumbar puncture   There is evidence of cytoalbumin dissociation will arrange IVIG  History of peripheral vascular disease, Previously longtime smoker Slowly progressive hearing loss, chronic tinnitus  MRI of the brain with without contrast with scattered T2/flair bihemispheric small vessel disease,  MRA of brain and neck: 50% stenosis of left internal carotid artery, 50% stenosis of right internal carotid artery  Keep aspirin 81 mg daily  Emphasized importance of volume expansion   Marcial Pacas, M.D. Ph.D.  [facility] [place], [place] [address] Phone: [phone] Fax:      [phone]

## 2019-11-25 NOTE — Procedures (Signed)
Full Name: Ryan Copeland Gender: Male MRN #: [id number] Date of Birth: 1950/04/19    Visit Date: 11/25/2019 07:27 Age: 69 Years Examining Physician: Marcial Pacas, MD  Referring Physician: Marcial Pacas, MD Height: 6 feet 0 inch History: 69 year old male with history of progressive bilateral feet paresthesia, gait abnormality,  Summary of the test: Nerve conduction study: Right sural, superficial peroneal, bilateral ulnar sensory response was absent.  Bilateral median and radial sensory responses showed significantly prolonged peak latency, with mildly decreased snap amplitude.  Right peroneal to EDB, tibial motor responses were absent.  Bilateral ulnar motor responses showed mildly prolonged distal latency, with moderately decreased CMAP amplitude.  Bilateral median motor responses showed significantly prolonged distal latency, significantly decreased CMAP amplitude, and conduction velocity. All the motor F wave latencies were significantly prolonged, there is also evidence of temporal dispersion right median, left median, and left ulnar motor response.  Electromyography: Selected needle examinations of right upper, lower extremity muscles, right cervical and lumbosacral paraspinal muscles were performed.  There was evidence of chronic neuropathic changes involving right upper and lower extremity muscles.  There is evidence of increased activity at right cervical and lumbosacral paraspinal muscles.  Conclusion: This is an abnormal study.  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   -------------------------------  Marcial Pacas, MD PhD Physician Name [facility] [place], [place] [address] Tel: [phone] Fax: [phone]  Verbal informed consent was obtained from the patient, patient was informed of potential risk of procedure, including bruising, bleeding, hematoma formation, infection, muscle  weakness, muscle pain, numbness, among others.         [place]    Nerve / Sites Muscle Latency Ref. Amplitude Ref. Rel Amp Segments Distance Velocity Ref. Area    ms ms mV mV %  cm m/s m/s mVms  L Median - APB     Wrist APB 6.3 ?4.4 3.4 ?4.0 100 Wrist - APB 7   18.7     Upper arm APB 13.0  2.5  73.1 Upper arm - Wrist 21 31 ?49 18.3  R Median - APB     Wrist APB 8.5 ?4.4 1.3 ?4.0 100 Wrist - APB 7   11.2     Upper arm APB 15.5  1.0  73.2 Upper arm - Wrist 24 34 ?49 9.7  L Ulnar - ADM     Wrist ADM 4.4 ?3.3 7.8 ?6.0 100 Wrist - ADM 7   29.7     B.Elbow ADM 10.4  5.1  65 B.Elbow - Wrist 22 36 ?49 27.6     A.Elbow ADM 13.4  4.6  89.7 A.Elbow - B.Elbow 10 34 ?49 25.4         A.Elbow - Wrist      R Ulnar - ADM     Wrist ADM 4.8 ?3.3 8.4 ?6.0 100 Wrist - ADM 9   26.2     B.Elbow ADM 10.0  7.4  87.8 B.Elbow - Wrist 20 39 ?49 24.5         A.Elbow - B.Elbow 10 38 ?49          A.Elbow - Wrist      R Peroneal - EDB     Ankle EDB NR ?6.5 NR ?2.0 NR Ankle - EDB 9   NR     Fib head EDB NR  NR  NR Fib head - Ankle 30 NR ?44 NR  R Tibial - AH  Ankle AH NR ?5.8 NR ?4.0 NR Ankle - AH 9 NR  NR                 [facility]    Nerve / Sites Rec. Site Peak Lat Ref.  Amp Ref. Segments Distance    ms ms V V  cm  L Radial - Anatomical snuff box (Forearm)     Forearm Wrist 3.9 ?2.9 10 ?15 Forearm - Wrist 10  R Radial - Anatomical snuff box (Forearm)     Forearm Wrist 3.7 ?2.9 10 ?15 Forearm - Wrist 10  R Sural - Ankle (Calf)     Calf Ankle NR ?4.4 NR ?6 Calf - Ankle 14  R Superficial peroneal - Ankle     Lat leg Ankle NR ?4.4 NR ?6 Lat leg - Ankle 14  L Median - Orthodromic (Dig II, Mid palm)     Dig II Wrist 5.0 ?3.4 4 ?10 Dig II - Wrist 13  R Median - Orthodromic (Dig II, Mid palm)     Dig II Wrist 4.9 ?3.4 5 ?10 Dig II - Wrist 16  L Ulnar - Orthodromic, (Dig V, Mid palm)     Dig V Wrist NR ?3.1 NR ?5 Dig V - Wrist 11  R Ulnar - Orthodromic, (Dig V, Mid palm)     Dig V Wrist NR ?3.1 NR ?5 Dig V -  Wrist 75                     F  Wave    Nerve F Lat Ref.   ms ms  L Median - APB 43.2 ?31.0  L Ulnar - ADM 40.8 ?32.0  R Median - APB 47.5 ?31.0  R Ulnar - ADM 42.0 ?32.0             EMG Summary Table    Spontaneous MUAP Recruitment  Muscle IA Fib PSW Fasc Other Amp Dur. Poly Pattern  R. Tibialis anterior Increased None None None _______ Increased Increased 1+ Reduced  R. Tibialis posterior Increased 1+ None None _______ Increased Increased 1+ Reduced  R. Peroneus longus Increased 1+ None None _______ Increased Increased 1+ Reduced  R. Gastrocnemius (Medial head) Increased 1+ None None _______ Increased Increased 1+ Reduced  R. Vastus lateralis Increased None None None _______ Increased Increased 1+ Reduced  R. Lumbar paraspinals (low) Increased None None None _______ Normal Normal Normal Normal  R. Lumbar paraspinals (mid) Increased None None None _______ Normal Normal Normal Normal  R. First dorsal interosseous Increased 1+ None None _______ Increased Increased 1+ Reduced  R. Pronator teres Increased 1+ None None _______ Increased Increased 1+ Normal  R. Biceps brachii Increased None None None _______ Increased Increased 1+ Normal  R. Deltoid Increased None None None _______ Increased Increased Normal Normal  R. Triceps brachii Increased None None None _______ Increased Increased Normal Reduced  R. Cervical paraspinals Normal None None None _______ Normal Normal Normal Normal

## 2019-11-27 ENCOUNTER — Telehealth: Payer: Self-pay | Admitting: Neurology

## 2019-11-27 ENCOUNTER — Encounter: Payer: Self-pay | Admitting: *Deleted

## 2019-11-27 NOTE — Telephone Encounter (Signed)
I talked to patient and he is Scheduled for LP . And he is aware of  Details patient want's to speak to nurse about His IVIG .

## 2019-11-27 NOTE — Progress Notes (Signed)
EMG nerve conduction study showed evidence of chronic demyelinating polyradiculoneuropathy,  Lumbar puncture pending  Preauthorization for IVIG 2 g/kg divided over 4 days, followed by 1 g/kg every 3 weeks, for total of 6 treatment.

## 2019-11-27 NOTE — Telephone Encounter (Addendum)
I spoke to the patient. He is scheduled on 12/05/19 for his LP. Reports he has experienced hives/rash in the past with Privigen. He was switched to [facility] and was infused many times without any adverse side effects.  I called [facility] at [phone]. They were responsible for his previous IVIG treatment. I spoke to Tim who confirmed the patient's report of starting on Privigen and having to be changed to Gamunex-C.  Dr. Krista Blue is aware of this allergy. His chart has also been updated with this information.  His signed IVIG (Gamunex-C) orders have been provided to [facility].

## 2019-11-28 ENCOUNTER — Inpatient Hospital Stay: Admission: RE | Admit: 2019-11-28 | Payer: Medicare Other | Source: Ambulatory Visit

## 2019-11-28 ENCOUNTER — Emergency Department (HOSPITAL_COMMUNITY): Payer: Medicare Other

## 2019-11-28 ENCOUNTER — Inpatient Hospital Stay (HOSPITAL_COMMUNITY)
Admission: EM | Admit: 2019-11-28 | Discharge: 2019-12-05 | DRG: 871 | Disposition: A | Discharge status: Home / Self Care | Payer: Medicare Other | Attending: Internal Medicine | Admitting: Internal Medicine

## 2019-11-28 ENCOUNTER — Other Ambulatory Visit: Payer: Self-pay

## 2019-11-28 DIAGNOSIS — Z888 Allergy status to other drugs, medicaments and biological substances status: Secondary | ICD-10-CM

## 2019-11-28 DIAGNOSIS — K219 Gastro-esophageal reflux disease without esophagitis: Secondary | ICD-10-CM | POA: Diagnosis present

## 2019-11-28 DIAGNOSIS — D849 Immunodeficiency, unspecified: Secondary | ICD-10-CM | POA: Diagnosis present

## 2019-11-28 DIAGNOSIS — E119 Type 2 diabetes mellitus without complications: Secondary | ICD-10-CM

## 2019-11-28 DIAGNOSIS — Z9221 Personal history of antineoplastic chemotherapy: Secondary | ICD-10-CM

## 2019-11-28 DIAGNOSIS — Z20822 Contact with and (suspected) exposure to covid-19: Secondary | ICD-10-CM | POA: Diagnosis present

## 2019-11-28 DIAGNOSIS — Z79899 Other long term (current) drug therapy: Secondary | ICD-10-CM

## 2019-11-28 DIAGNOSIS — Z955 Presence of coronary angioplasty implant and graft: Secondary | ICD-10-CM | POA: Diagnosis not present

## 2019-11-28 DIAGNOSIS — M549 Dorsalgia, unspecified: Secondary | ICD-10-CM | POA: Diagnosis present

## 2019-11-28 DIAGNOSIS — A419 Sepsis, unspecified organism: Secondary | ICD-10-CM | POA: Diagnosis present

## 2019-11-28 DIAGNOSIS — G9341 Metabolic encephalopathy: Secondary | ICD-10-CM | POA: Diagnosis present

## 2019-11-28 DIAGNOSIS — G629 Polyneuropathy, unspecified: Secondary | ICD-10-CM | POA: Diagnosis not present

## 2019-11-28 DIAGNOSIS — D696 Thrombocytopenia, unspecified: Secondary | ICD-10-CM | POA: Diagnosis present

## 2019-11-28 DIAGNOSIS — Z87891 Personal history of nicotine dependence: Secondary | ICD-10-CM

## 2019-11-28 DIAGNOSIS — I252 Old myocardial infarction: Secondary | ICD-10-CM | POA: Diagnosis not present

## 2019-11-28 DIAGNOSIS — I739 Peripheral vascular disease, unspecified: Secondary | ICD-10-CM | POA: Diagnosis not present

## 2019-11-28 DIAGNOSIS — F419 Anxiety disorder, unspecified: Secondary | ICD-10-CM | POA: Diagnosis present

## 2019-11-28 DIAGNOSIS — I110 Hypertensive heart disease with heart failure: Secondary | ICD-10-CM | POA: Diagnosis present

## 2019-11-28 DIAGNOSIS — J449 Chronic obstructive pulmonary disease, unspecified: Secondary | ICD-10-CM | POA: Diagnosis not present

## 2019-11-28 DIAGNOSIS — Z885 Allergy status to narcotic agent status: Secondary | ICD-10-CM

## 2019-11-28 DIAGNOSIS — J9601 Acute respiratory failure with hypoxia: Secondary | ICD-10-CM | POA: Diagnosis present

## 2019-11-28 DIAGNOSIS — Z9049 Acquired absence of other specified parts of digestive tract: Secondary | ICD-10-CM | POA: Diagnosis not present

## 2019-11-28 DIAGNOSIS — Z781 Physical restraint status: Secondary | ICD-10-CM

## 2019-11-28 DIAGNOSIS — J440 Chronic obstructive pulmonary disease with acute lower respiratory infection: Secondary | ICD-10-CM | POA: Diagnosis present

## 2019-11-28 DIAGNOSIS — R6520 Severe sepsis without septic shock: Secondary | ICD-10-CM | POA: Diagnosis present

## 2019-11-28 DIAGNOSIS — E876 Hypokalemia: Secondary | ICD-10-CM | POA: Diagnosis not present

## 2019-11-28 DIAGNOSIS — E1151 Type 2 diabetes mellitus with diabetic peripheral angiopathy without gangrene: Secondary | ICD-10-CM | POA: Diagnosis present

## 2019-11-28 DIAGNOSIS — C8308 Small cell B-cell lymphoma, lymph nodes of multiple sites: Secondary | ICD-10-CM | POA: Diagnosis present

## 2019-11-28 DIAGNOSIS — I5032 Chronic diastolic (congestive) heart failure: Secondary | ICD-10-CM | POA: Diagnosis present

## 2019-11-28 DIAGNOSIS — J159 Unspecified bacterial pneumonia: Secondary | ICD-10-CM | POA: Diagnosis present

## 2019-11-28 DIAGNOSIS — Z8249 Family history of ischemic heart disease and other diseases of the circulatory system: Secondary | ICD-10-CM

## 2019-11-28 DIAGNOSIS — E538 Deficiency of other specified B group vitamins: Secondary | ICD-10-CM | POA: Diagnosis present

## 2019-11-28 DIAGNOSIS — G8929 Other chronic pain: Secondary | ICD-10-CM | POA: Diagnosis present

## 2019-11-28 DIAGNOSIS — Z8616 Personal history of COVID-19: Secondary | ICD-10-CM

## 2019-11-28 DIAGNOSIS — J189 Pneumonia, unspecified organism: Secondary | ICD-10-CM | POA: Diagnosis present

## 2019-11-28 DIAGNOSIS — R339 Retention of urine, unspecified: Secondary | ICD-10-CM

## 2019-11-28 DIAGNOSIS — I2510 Atherosclerotic heart disease of native coronary artery without angina pectoris: Secondary | ICD-10-CM | POA: Diagnosis present

## 2019-11-28 DIAGNOSIS — E785 Hyperlipidemia, unspecified: Secondary | ICD-10-CM | POA: Diagnosis present

## 2019-11-28 DIAGNOSIS — I959 Hypotension, unspecified: Secondary | ICD-10-CM | POA: Diagnosis present

## 2019-11-28 DIAGNOSIS — R531 Weakness: Secondary | ICD-10-CM | POA: Diagnosis present

## 2019-11-28 DIAGNOSIS — R443 Hallucinations, unspecified: Secondary | ICD-10-CM | POA: Diagnosis present

## 2019-11-28 DIAGNOSIS — I2583 Coronary atherosclerosis due to lipid rich plaque: Secondary | ICD-10-CM | POA: Diagnosis not present

## 2019-11-28 DIAGNOSIS — Z95828 Presence of other vascular implants and grafts: Secondary | ICD-10-CM

## 2019-11-28 DIAGNOSIS — Z7984 Long term (current) use of oral hypoglycemic drugs: Secondary | ICD-10-CM

## 2019-11-28 LAB — URINALYSIS, ROUTINE W REFLEX MICROSCOPIC
Bilirubin Urine: NEGATIVE
Glucose, UA: NEGATIVE mg/dL
Hgb urine dipstick: NEGATIVE
Ketones, ur: NEGATIVE mg/dL
Leukocytes,Ua: NEGATIVE
Nitrite: NEGATIVE
Protein, ur: NEGATIVE mg/dL
Specific Gravity, Urine: 1.01 (ref 1.005–1.030)
pH: 5 (ref 5.0–8.0)

## 2019-11-28 LAB — COMPREHENSIVE METABOLIC PANEL
ALT: 15 U/L (ref 0–44)
AST: 20 U/L (ref 15–41)
Albumin: 2.8 g/dL — ABNORMAL LOW (ref 3.5–5.0)
Alkaline Phosphatase: 54 U/L (ref 38–126)
Anion gap: 8 (ref 5–15)
BUN: 16 mg/dL (ref 8–23)
CO2: 26 mmol/L (ref 22–32)
Calcium: 8.3 mg/dL — ABNORMAL LOW (ref 8.9–10.3)
Chloride: 105 mmol/L (ref 98–111)
Creatinine, Ser: 1.01 mg/dL (ref 0.61–1.24)
GFR, Estimated: >60 mL/min (ref >60)
Glucose, Bld: 76 mg/dL (ref 70–99)
Potassium: 3.5 mmol/L (ref 3.5–5.1)
Sodium: 139 mmol/L (ref 135–145)
Total Bilirubin: 0.6 mg/dL (ref 0.3–1.2)
Total Protein: 4.8 g/dL — ABNORMAL LOW (ref 6.5–8.1)

## 2019-11-28 LAB — CBC WITH DIFFERENTIAL/PLATELET
Abs Immature Granulocytes: 0.3 10*3/uL — ABNORMAL HIGH (ref 0.00–0.07)
Basophils Absolute: 0 10*3/uL (ref 0.0–0.1)
Basophils Relative: 0 %
Eosinophils Absolute: 0 10*3/uL (ref 0.0–0.5)
Eosinophils Relative: 0 %
HCT: 38.6 % — ABNORMAL LOW (ref 39.0–52.0)
Hemoglobin: 12.3 g/dL — ABNORMAL LOW (ref 13.0–17.0)
Immature Granulocytes: 8 %
Lymphocytes Relative: 17 %
Lymphs Abs: 0.7 10*3/uL (ref 0.7–4.0)
MCH: 32.6 pg (ref 26.0–34.0)
MCHC: 31.9 g/dL (ref 30.0–36.0)
MCV: 102.4 fL — ABNORMAL HIGH (ref 80.0–100.0)
Monocytes Absolute: 0.4 10*3/uL (ref 0.1–1.0)
Monocytes Relative: 11 %
Neutro Abs: 2.4 10*3/uL (ref 1.7–7.7)
Neutrophils Relative %: 64 %
Platelets: DECREASED 10*3/uL (ref 150–400)
RBC: 3.77 MIL/uL — ABNORMAL LOW (ref 4.22–5.81)
RDW: 16.7 % — ABNORMAL HIGH (ref 11.5–15.5)
WBC: 3.7 10*3/uL — ABNORMAL LOW (ref 4.0–10.5)
nRBC: 0 % (ref 0.0–0.2)

## 2019-11-28 LAB — CBC
HCT: 37.3 % — ABNORMAL LOW (ref 39.0–52.0)
HCT: 35.4 % — ABNORMAL LOW (ref 39.0–52.0)
Hemoglobin: 11.8 g/dL — ABNORMAL LOW (ref 13.0–17.0)
Hemoglobin: 11.5 g/dL — ABNORMAL LOW (ref 13.0–17.0)
MCH: 32.5 pg (ref 26.0–34.0)
MCH: 32.4 pg (ref 26.0–34.0)
MCHC: 31.6 g/dL (ref 30.0–36.0)
MCHC: 32.5 g/dL (ref 30.0–36.0)
MCV: 102.8 fL — ABNORMAL HIGH (ref 80.0–100.0)
MCV: 99.7 fL (ref 80.0–100.0)
Platelets: 51 10*3/uL — ABNORMAL LOW (ref 150–400)
Platelets: 53 10*3/uL — ABNORMAL LOW (ref 150–400)
RBC: 3.63 MIL/uL — ABNORMAL LOW (ref 4.22–5.81)
RBC: 3.55 MIL/uL — ABNORMAL LOW (ref 4.22–5.81)
RDW: 16.8 % — ABNORMAL HIGH (ref 11.5–15.5)
RDW: 16.6 % — ABNORMAL HIGH (ref 11.5–15.5)
WBC: 3.6 10*3/uL — ABNORMAL LOW (ref 4.0–10.5)
WBC: 3 10*3/uL — ABNORMAL LOW (ref 4.0–10.5)
nRBC: 0 % (ref 0.0–0.2)
nRBC: 0 % (ref 0.0–0.2)

## 2019-11-28 LAB — PROCALCITONIN: Procalcitonin: 2.51 ng/mL

## 2019-11-28 LAB — D-DIMER, QUANTITATIVE: D-Dimer, Quant: 0.67 ug/mL-FEU — ABNORMAL HIGH (ref 0.00–0.50)

## 2019-11-28 LAB — RESPIRATORY PANEL BY RT PCR (FLU A&B, COVID)
Influenza A by PCR: NEGATIVE
Influenza B by PCR: NEGATIVE
SARS Coronavirus 2 by RT PCR: NEGATIVE

## 2019-11-28 LAB — CBG MONITORING, ED: Glucose-Capillary: 84 mg/dL (ref 70–99)

## 2019-11-28 LAB — TSH: TSH: 1.02 u[IU]/mL (ref 0.350–4.500)

## 2019-11-28 LAB — VITAMIN B12: Vitamin B-12: 106 pg/mL — ABNORMAL LOW (ref 180–914)

## 2019-11-28 LAB — LACTIC ACID, PLASMA
Lactic Acid, Venous: 1.7 mmol/L (ref 0.5–1.9)
Lactic Acid, Venous: 0.9 mmol/L (ref 0.5–1.9)

## 2019-11-28 LAB — PLATELET BY CITRATE

## 2019-11-28 LAB — STREP PNEUMONIAE URINARY ANTIGEN: Strep Pneumo Urinary Antigen: NEGATIVE

## 2019-11-28 LAB — HIV ANTIBODY (ROUTINE TESTING W REFLEX): HIV Screen 4th Generation wRfx: NONREACTIVE

## 2019-11-28 LAB — CREATININE, SERUM
Creatinine, Ser: 0.96 mg/dL (ref 0.61–1.24)
GFR, Estimated: >60 mL/min (ref >60)

## 2019-11-28 LAB — GLUCOSE, CAPILLARY: Glucose-Capillary: 90 mg/dL (ref 70–99)

## 2019-11-28 LAB — PROTIME-INR
INR: 1.2 (ref 0.8–1.2)
Prothrombin Time: 14.5 seconds (ref 11.4–15.2)

## 2019-11-28 LAB — APTT: aPTT: 29 seconds (ref 24–36)

## 2019-11-28 MED ORDER — ENOXAPARIN SODIUM 40 MG/0.4ML ~~LOC~~ SOLN
40.0000 mg | SUBCUTANEOUS | Status: DC
  Administered 2019-11-28 – 2019-11-30 (×3): 40 mg via SUBCUTANEOUS
  Filled 2019-11-28 (×3): qty 0.4

## 2019-11-28 MED ORDER — HALOPERIDOL LACTATE 5 MG/ML IJ SOLN
2.0000 mg | Freq: Four times a day (QID) | INTRAMUSCULAR | Status: DC | PRN
  Administered 2019-11-28 – 2019-11-29 (×2): 2 mg via INTRAVENOUS
  Filled 2019-11-28 (×2): qty 1

## 2019-11-28 MED ORDER — ONDANSETRON HCL 4 MG PO TABS: 4.0000 mg | ORAL_TABLET | Freq: Four times a day (QID) | ORAL | Status: DC | PRN

## 2019-11-28 MED ORDER — STERILE WATER FOR INJECTION IJ SOLN
INTRAMUSCULAR | Status: AC
  Administered 2019-11-28: 1 mL
  Filled 2019-11-28: qty 10

## 2019-11-28 MED ORDER — FLUTICASONE PROPIONATE 50 MCG/ACT NA SUSP
2.0000 | Freq: Every day | NASAL | Status: DC
  Administered 2019-11-29 – 2019-11-30 (×2): 2 via NASAL
  Filled 2019-11-28: qty 16

## 2019-11-28 MED ORDER — FLUTICASONE FUROATE-VILANTEROL 200-25 MCG/INH IN AEPB
1.0000 | INHALATION_SPRAY | Freq: Every day | RESPIRATORY_TRACT | Status: DC
  Administered 2019-11-29 – 2019-12-05 (×6): 1 via RESPIRATORY_TRACT
  Filled 2019-11-28: qty 28

## 2019-11-28 MED ORDER — ACETAMINOPHEN 325 MG PO TABS
650.0000 mg | ORAL_TABLET | Freq: Four times a day (QID) | ORAL | Status: DC | PRN
  Filled 2019-11-28: qty 2

## 2019-11-28 MED ORDER — VANCOMYCIN HCL IN DEXTROSE 1-5 GM/200ML-% IV SOLN: 1000.0000 mg | Freq: Once | INTRAVENOUS | Status: DC

## 2019-11-28 MED ORDER — LORAZEPAM 2 MG/ML IJ SOLN
1.0000 mg | Freq: Once | INTRAMUSCULAR | Status: AC
  Administered 2019-11-28: 2 mg via INTRAVENOUS
  Filled 2019-11-28: qty 1

## 2019-11-28 MED ORDER — MONTELUKAST SODIUM 10 MG PO TABS
10.0000 mg | ORAL_TABLET | Freq: Every day | ORAL | Status: DC
  Administered 2019-11-29 – 2019-11-30 (×2): 10 mg via ORAL
  Filled 2019-11-28 (×4): qty 1

## 2019-11-28 MED ORDER — DEXTROSE-NACL 5-0.9 % IV SOLN: INTRAVENOUS | Status: AC

## 2019-11-28 MED ORDER — ALBUTEROL SULFATE (2.5 MG/3ML) 0.083% IN NEBU: 2.5000 mg | INHALATION_SOLUTION | RESPIRATORY_TRACT | Status: DC | PRN

## 2019-11-28 MED ORDER — INSULIN ASPART 100 UNIT/ML ~~LOC~~ SOLN: 0.0000 [IU] | Freq: Three times a day (TID) | SUBCUTANEOUS | Status: DC

## 2019-11-28 MED ORDER — PIPERACILLIN-TAZOBACTAM 3.375 G IVPB 30 MIN
3.3750 g | Freq: Once | INTRAVENOUS | Status: AC
  Administered 2019-11-28: 3.375 g via INTRAVENOUS
  Filled 2019-11-28: qty 50

## 2019-11-28 MED ORDER — ZIPRASIDONE MESYLATE 20 MG IM SOLR
10.0000 mg | Freq: Once | INTRAMUSCULAR | Status: AC
  Administered 2019-11-28: 10 mg via INTRAMUSCULAR
  Filled 2019-11-28: qty 20

## 2019-11-28 MED ORDER — IPRATROPIUM BROMIDE 0.02 % IN SOLN: 0.5000 mg | Freq: Four times a day (QID) | RESPIRATORY_TRACT | Status: DC | PRN

## 2019-11-28 MED ORDER — VANCOMYCIN HCL IN DEXTROSE 1-5 GM/200ML-% IV SOLN
1000.0000 mg | Freq: Two times a day (BID) | INTRAVENOUS | Status: DC
  Administered 2019-11-29 – 2019-12-01 (×5): 1000 mg via INTRAVENOUS
  Filled 2019-11-28 (×5): qty 200

## 2019-11-28 MED ORDER — PIPERACILLIN-TAZOBACTAM 3.375 G IVPB
3.3750 g | Freq: Three times a day (TID) | INTRAVENOUS | Status: DC
  Administered 2019-11-28 – 2019-12-03 (×14): 3.375 g via INTRAVENOUS
  Filled 2019-11-28 (×14): qty 50

## 2019-11-28 MED ORDER — POLYETHYLENE GLYCOL 3350 17 G PO PACK: 17.0000 g | PACK | Freq: Every day | ORAL | Status: DC | PRN

## 2019-11-28 MED ORDER — ONDANSETRON HCL 4 MG/2ML IJ SOLN: 4.0000 mg | Freq: Four times a day (QID) | INTRAMUSCULAR | Status: DC | PRN

## 2019-11-28 MED ORDER — ACETAMINOPHEN 650 MG RE SUPP
650.0000 mg | Freq: Once | RECTAL | Status: AC
  Administered 2019-11-28: 650 mg via RECTAL
  Filled 2019-11-28: qty 1

## 2019-11-28 MED ORDER — BISACODYL 10 MG RE SUPP: 10.0000 mg | Freq: Every day | RECTAL | Status: DC | PRN

## 2019-11-28 MED ORDER — ALLOPURINOL 300 MG PO TABS
300.0000 mg | ORAL_TABLET | Freq: Every day | ORAL | Status: DC
  Administered 2019-11-29 – 2019-12-05 (×7): 300 mg via ORAL
  Filled 2019-11-28 (×7): qty 1

## 2019-11-28 MED ORDER — VANCOMYCIN HCL 1500 MG/300ML IV SOLN
1500.0000 mg | Freq: Once | INTRAVENOUS | Status: AC
  Administered 2019-11-28: 1500 mg via INTRAVENOUS
  Filled 2019-11-28: qty 300

## 2019-11-28 MED ORDER — STERILE WATER FOR INJECTION IJ SOLN
INTRAMUSCULAR | Status: AC
  Administered 2019-11-28: 10 mL
  Filled 2019-11-28: qty 10

## 2019-11-28 MED ORDER — ROSUVASTATIN CALCIUM 5 MG PO TABS
5.0000 mg | ORAL_TABLET | Freq: Every day | ORAL | Status: DC
  Administered 2019-11-29 – 2019-12-05 (×7): 5 mg via ORAL
  Filled 2019-11-28 (×7): qty 1

## 2019-11-28 MED ORDER — INSULIN ASPART 100 UNIT/ML ~~LOC~~ SOLN: 0.0000 [IU] | Freq: Every day | SUBCUTANEOUS | Status: DC

## 2019-11-28 MED ORDER — ACETAMINOPHEN 650 MG RE SUPP: 650.0000 mg | Freq: Four times a day (QID) | RECTAL | Status: DC | PRN

## 2019-11-28 MED ORDER — SODIUM CHLORIDE 0.9 % IV BOLUS
1000.0000 mL | Freq: Once | INTRAVENOUS | Status: AC
  Administered 2019-11-28: 1000 mL via INTRAVENOUS

## 2019-11-28 MED ORDER — ACYCLOVIR 400 MG PO TABS
400.0000 mg | ORAL_TABLET | Freq: Two times a day (BID) | ORAL | Status: DC
  Administered 2019-11-29 – 2019-12-04 (×10): 400 mg via ORAL
  Filled 2019-11-28 (×15): qty 1

## 2019-11-28 MED ORDER — PREGABALIN 100 MG PO CAPS
200.0000 mg | ORAL_CAPSULE | Freq: Three times a day (TID) | ORAL | Status: DC
  Administered 2019-11-29 – 2019-12-05 (×17): 200 mg via ORAL
  Filled 2019-11-28 (×19): qty 2

## 2019-11-28 NOTE — ED Triage Notes (Signed)
Pt presents from home for fever (101.39F), lethargy, productive cough (yellow) since 9/4, states that he received 2nd covid vaccine at that time. Has seen PCP for this, has had CXR, lung lesion found. Had follow up and bone marrow bipsy scheduled for future. Taking z-pack and prednisone. EMS vitals 88/40, 90/50 after 800cc NS IV 82%RA, 99% on 6L CBG 86 90HR 101.39F  20G L forearm  EMS exam: A&Ox4, lethargic, lungs clear, febrile

## 2019-11-28 NOTE — H&P (Signed)
History and Physical    Ryan Copeland UYQ:[id number] DOB: 01/13/1951 DOA: 11/28/2019  PCP: [name]., PA-C  Patient coming from: Home/EMS  I have personally briefly reviewed patient's old medical records in [place] [facility]  Chief Complaint: Altered mental status  HPI: Ryan Copeland is a 69 y.o. 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  76 of HPI obtained from patient spouse, ED physician, EMS reports due to patient's altered mental status and inability to participate in HPI.  Apparently, patient has been slowly declining since his recent second Covid-19 vaccination on September 2021.  He was seen by his PCP recently on 11/20/2019 and given treatment with azithromycin, Levaquin and prednisone.  He continues to slowly decline with associated mental status changes, weakness.  No other recent changes in his medications or sick contacts.  ED Course: In the ED, temperature 101.4, HR 127, RR 18, BP 97/49, SPO2 98% on 6 L nasal cannula (82% on room air).  WBC count 3.7, hemoglobin 12.3, platelets clumped.  Sodium 139, potassium 3.5, chloride 105, CO2 26, BUN 16, creatinine 1.01, glucose 76.  Urinalysis unrevealing.  Covid PCR/rapid influenza negative.  Lactic acid 1.7.  Chest x-ray with extensive peripheral predominant opacities left mid lung/lung base consistent with pneumonia.  Patient was given vancomycin, Zosyn, Ativan 1 mg IV, Geodon 10 mg IM x2, normal saline 1 L bolus and Tylenol PR.  Patient referred for admission for acute metabolic encephalopathy secondary to sepsis/community-acquired pneumonia in the setting of immunocompromise state.  Review of Systems: As per HPI otherwise 10 point review of systems negative.    Past Medical History:  Diagnosis Date  . Anxiety 01/30/2014  . Back injury    lower disc  .  CAD (coronary artery disease)   . CLL (chronic lymphocytic leukemia) ([place]) 03/18/2011  . COPD (chronic obstructive pulmonary disease) ([place])   . Diabetes mellitus ([place])    Type 2   . ECRB (extensor carpi radialis brevis) tenosynovitis   . GERD (gastroesophageal reflux disease)    takes Nexium if needed  . Hyperlipidemia   . Lateral epicondylitis of left elbow   . MI, acute, non ST segment elevation ([place]) 06/28/2009   with stenting of the LAD  . Neuromuscular disorder ([place])    peripheral neuropathy  . PVD (peripheral vascular disease) ([place])   . Tobacco abuse     Past Surgical History:  Procedure Laterality Date  . ADENOIDECTOMY  1955  . CARDIAC CATHETERIZATION    . CARDIAC CATHETERIZATION N/A 09/26/2014   Procedure: Left Heart Cath and Coronary Angiography;  Surgeon: Peter M Martinique, MD;  Location: [facility];  Service: Cardiovascular;  Laterality: N/A;  . carpel tunnel release Left 04-1989  . carpel tunnel release  Right 01-1989  . CHOLECYSTECTOMY  2007  . CORONARY STENT PLACEMENT  May 2011  . femoral stents    . IR CV LINE INJECTION  08/18/2017  . IR CV LINE INJECTION  09/01/2017  . IR CV LINE INJECTION  02/02/2018  . LATERAL EPICONDYLE RELEASE Left 02/12/2014   Procedure: LEFT ELBOW DEBRIDEMENT WITH TENDON REPAIR ;  Surgeon: Lorn Junes, MD;  Location: [facility];  Service: Orthopedics;  Laterality: Left;  . LEFT CAI STENT/PTA AND POPLITEAL ARTERY/TIBIAL THROMBECTOMY     . LEFT HEART CATHETERIZATION WITH CORONARY ANGIOGRAM N/A 08/26/2011  Procedure: LEFT HEART CATHETERIZATION WITH CORONARY ANGIOGRAM;  Surgeon: Peter M Martinique, MD;  Location[facility];  Service: Cardiovascular;  Laterality: N/A;  . PERIPHERAL VASCULAR CATHETERIZATION N/A 01/01/2015   Procedure: Abdominal Aortogram;  Surgeon: Conrad Butler [place], MD;  Location: [facility];  Service: Cardiovascular;  Laterality: N/A;  . TARSAL TUNNEL RELEASE Bilateral 08-2007     reports that he quit smoking about 3 years  ago. His smoking use included cigarettes. He has a 44.00 pack-year smoking history. He has quit using smokeless tobacco. He reports that he does not drink alcohol and does not use drugs.  Allergies  Allergen Reactions  . Privigen [Immune Globulin] Hives    Pt reports hives/rash on body with IV Privigen. He was changed to IV Gamunex-C and had many infusions without adverse side effects.  . Codeine Hives    Pt states he can take a few, more reaction with extended doses.    Family History  Problem Relation Age of Onset  . Lung cancer Mother 31  . Cancer Mother        lung  . Heart failure Father 49  . Heart disease Father     Family history reviewed and not pertinent   Prior to Admission medications   Medication Sig Start Date End Date Taking? Authorizing Provider  acyclovir (ZOVIRAX) 400 MG tablet TAKE 1 TABLET BY MOUTH TWICE A DAY Patient taking differently: Take 400 mg by mouth 2 (two) times daily.  07/24/19  Yes Gorsuch, Ni, MD  albuterol (PROVENTIL HFA) 108 (90 Base) MCG/ACT inhaler TAKE 2 PUFFS BY MOUTH EVERY 6 HOURS AS NEEDED FOR WHEEZE OR SHORTNESS OF BREATH Patient taking differently: Inhale 2 puffs into the lungs every 6 (six) hours as needed for wheezing or shortness of breath. TAKE 2 PUFFS BY MOUTH EVERY 6 HOURS AS NEEDED FOR WHEEZE OR SHORTNESS OF BREATH 06/27/19  Yes Spero Geralds, MD  allopurinol (ZYLOPRIM) 300 MG tablet TAKE 1 TABLET BY MOUTH EVERY DAY Patient taking differently: Take 300 mg by mouth daily.  09/23/19  Yes Gorsuch, Ni, MD  ALPRAZolam (XANAX) 0.25 MG tablet Take 0.5 mg by mouth as needed for anxiety.    Yes [provider]  b complex vitamins tablet Take 1 tablet by mouth daily.    Yes [provider]  benzonatate (TESSALON) 100 MG capsule Take 2 capsules (200 mg total) by mouth 3 (three) times daily as needed for cough. 11/14/19  Yes Spero Geralds, MD  CALQUENCE 100 MG capsule TAKE 100 MG BY MOUTH 2 (TWO) TIMES DAILY. Patient taking  differently: Take 100 mg by mouth 2 (two) times daily.  04/11/19  Yes Gorsuch, Ni, MD  Cholecalciferol (VITAMIN D-1000 MAX ST) 1000 units tablet Take 1,000 Units by mouth daily.    Yes [provider]  famotidine (PEPCID) 20 MG tablet TAKE 1 TABLET BY MOUTH TWICE A DAY Patient taking differently: Take 20 mg by mouth 2 (two) times daily as needed for heartburn.  02/11/19  Yes Gorsuch, Ni, MD  fluticasone furoate-vilanterol (BREO ELLIPTA) 200-25 MCG/INH AEPB Inhale 1 puff into the lungs daily. 06/27/19  Yes Spero Geralds, MD  furosemide (LASIX) 20 MG tablet TAKE 1 TABLET BY MOUTH EVERY DAY AS NEEDED Patient taking differently: Take 20 mg by mouth daily as needed for fluid or edema.  04/23/19  Yes Burtis Junes, NP  HYDROcodone-acetaminophen (NORCO) 10-325 MG tablet Take 1 tablet by mouth See admin instructions. Every 6 to 8 hours  as needed for pain 04/14/15  Yes [provider]  HYDROcodone-homatropine (HYCODAN) 5-1.5 MG/5ML syrup Take 5 mLs by mouth every 6 (six) hours as needed for cough. 11/21/19  Yes [provider]  lidocaine-prilocaine (EMLA) cream APPLY TO AFFECTED AREA ONCE Patient taking differently: Apply 1 application topically once.  12/21/17  Yes Nicholas Lose, MD  loperamide (IMODIUM) 2 MG capsule Take 2 capsules (4 mg total) by mouth every 6 (six) hours as needed for diarrhea or loose stools. 10/25/19  Yes [name], MD  meclizine (ANTIVERT) 12.5 MG tablet Take 12.5 mg by mouth every 6 (six) hours as needed for dizziness.  12/24/14  Yes [provider]  metFORMIN (GLUCOPHAGE-XR) 500 MG 24 hr tablet Take 500-1,000 mg by mouth See admin instructions. Takes one tablet (500mg )  in AM and 2 tablets (1000mg ) at night.   Yes [provider]  mometasone (NASONEX) 50 MCG/ACT nasal spray Place 2 sprays into the nose daily. 11/08/19  Yes Spero Geralds, MD  montelukast (SINGULAIR) 10 MG tablet Take 1 tablet (10 mg total) by mouth daily. 06/27/19  Yes Spero Geralds, MD  nitroGLYCERIN (NITROSTAT) 0.4 MG SL tablet PLACE 1 TABLET UNDER THE TONGUE EVERY 5 MINUTES X 3 DOSES AS NEEDED FOR CHEST PAIN *MAX 3 DOSES* Patient taking differently: Place 0.4 mg under the tongue See admin instructions. PLACE 1 TABLET UNDER THE TONGUE EVERY 5 MINUTES X 3 DOSES AS NEEDED FOR CHEST PAIN *MAX 3 DOSES* 10/03/19  Yes Burtis Junes, NP  predniSONE (DELTASONE) 20 MG tablet Take 20 mg by mouth See admin instructions. Take 1 tablet (20mg ) by mouth three times daily for 3 days, then take 1 tablet (20mg ) by mouth twice daily for 3 days, then take 1 tablet by mouth daily for 3 days. 11/20/19  Yes [provider]  pregabalin (LYRICA) 200 MG capsule TAKE 1 CAPSULE (200 MG TOTAL) BY MOUTH 3 (THREE) TIMES DAILY. 09/02/19  Yes Suzzanne Cloud, NP  rosuvastatin (CRESTOR) 5 MG tablet TAKE 1 TABLET BY MOUTH EVERY DAY Patient taking differently: Take 5 mg by mouth daily.  07/24/19  Yes Burtis Junes, NP  zolpidem (AMBIEN) 10 MG tablet Take 10 mg by mouth at bedtime as needed for sleep.   Yes [provider]  propranolol (INDERAL) 20 MG tablet Take 1 tablet (20 mg total) by mouth 2 (two) times daily. Patient not taking: Reported on 11/28/2019 10/22/19   Marcial Pacas, MD  sildenafil (VIAGRA) 100 MG tablet Take 0.5-1 tablets (50-100 mg total) by mouth as needed for erectile dysfunction. Patient not taking: Reported on 11/28/2019 06/12/18   Burtis Junes, NP    Physical Exam: Vitals:   11/28/19 1400 11/28/19 1530 11/28/19 1615 11/28/19 1645  BP: 121/80 (!) 119/92 102/63 102/65  Pulse: (!) 127 (!) 122 (!) 103 99  Resp: 18 20  (!) 22  Temp:      TempSrc:      SpO2: 98% 97% 91% 95%  Weight:      Height:        Constitutional: Confused, mildly agitated, appears older than stated age and disheveled state Eyes: PERRL, lids and conjunctivae normal ENMT: Mucous membranes are dry. Posterior pharynx clear of any exudate or lesions.poor dentition.  Neck: normal, supple, no  masses, no thyromegaly Respiratory: Decreased breath sounds bilateral bases, left-sided rhonchi/crackles especially at left base, no wheezing, normal respiratory effort, on 6 L nasal cannula Cardiovascular: Tachycardic, regular rhythm, no murmurs / rubs / gallops.  No extremity edema. 2+ pedal pulses. No carotid bruits.  Abdomen: no tenderness, no masses palpated. No hepatosplenomegaly. Bowel sounds positive.  Musculoskeletal: no clubbing / cyanosis. No joint deformity upper and lower extremities. Good ROM, no contractures. Normal muscle tone.  Skin: Scab noted left knee, multiple areas of ecchymosis most notable bilateral upper extremities Neurologic: CN 2-12 grossly intact. Sensation intact, DTR normal. Strength 5/5 in all 4.  Psychiatric: Judgment and insight poor. Alert, oriented to time 2021, place hospital but believes he is in [place], and not person.     Labs on Admission: I have personally reviewed following labs and imaging studies  CBC: Recent Labs  Lab 11/28/19 1131  WBC 3.7*  NEUTROABS 2.4  HGB 12.3*  HCT 38.6*  MCV 102.4*  PLT PLATELET CLUMPS NOTED ON SMEAR, COUNT APPEARS DECREASED   Basic Metabolic Panel: Recent Labs  Lab 11/28/19 1131  NA 139  K 3.5  CL 105  CO2 26  GLUCOSE 76  BUN 16  CREATININE 1.01  CALCIUM 8.3*   GFR: Estimated Creatinine Clearance: 71.9 mL/min (by C-G formula based on SCr of 1.01 mg/dL). Liver Function Tests: Recent Labs  Lab 11/28/19 1131  AST 20  ALT 15  ALKPHOS 54  BILITOT 0.6  PROT 4.8*  ALBUMIN 2.8*   No results for input(s): LIPASE, AMYLASE in the last 168 hours. No results for input(s): AMMONIA in the last 168 hours. Coagulation Profile: Recent Labs  Lab 11/28/19 1131  INR 1.2   Cardiac Enzymes: No results for input(s): CKTOTAL, CKMB, CKMBINDEX, TROPONINI in the last 168 hours. BNP (last 3 results) No results for input(s): PROBNP in the last 8760 hours. HbA1C: No results for input(s): HGBA1C in the last 72  hours. CBG: No results for input(s): GLUCAP in the last 168 hours. Lipid Profile: No results for input(s): CHOL, HDL, LDLCALC, TRIG, CHOLHDL, LDLDIRECT in the last 72 hours. Thyroid Function Tests: No results for input(s): TSH, T4TOTAL, FREET4, T3FREE, THYROIDAB in the last 72 hours. Anemia Panel: No results for input(s): VITAMINB12, FOLATE, FERRITIN, TIBC, IRON, RETICCTPCT in the last 72 hours. Urine analysis:    Component Value Date/Time   COLORURINE YELLOW 11/28/2019 1127   APPEARANCEUR CLEAR 11/28/2019 1127   LABSPEC 1.010 11/28/2019 1127   PHURINE 5.0 11/28/2019 1127   GLUCOSEU NEGATIVE 11/28/2019 1127   [place] 11/28/2019 [place] 11/28/2019 1127   [place] 11/28/2019 1127   PROTEINUR NEGATIVE 11/28/2019 1127   UROBILINOGEN 0.2 02/03/2014 1356   NITRITE NEGATIVE 11/28/2019 1127   LEUKOCYTESUR NEGATIVE 11/28/2019 1127    Radiological Exams on Admission: DG Chest Port 1 View  Result Date: 11/28/2019 CLINICAL DATA:  Questionable sepsis. Patient lethargic and ill for 4 weeks. EXAM: PORTABLE CHEST 1 VIEW COMPARISON:  None. FINDINGS: Extensive peripheral predominant airspace opacities in the left midlung and lung base. No visible pleural effusions or pneumothorax. Mild enlargement the cardiac silhouette. Coronary artery stent. Aortic atherosclerosis. Right approach Port-A-Cath with the tip projecting at the inferior SVC. No acute osseous abnormality. IMPRESSION: 1. Extensive peripheral predominant airspace opacities in the left midlung and lung base, compatible with pneumonia. While nonspecific, consider COVID. 2. Mild cardiomegaly. Electronically Signed   By: Margaretha Sheffield MD   On: 11/28/2019 12:27    EKG: Independently reviewed.   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  Patient was found to be febrile with temperature 101.4, tachycardic.  Recent antibiotic use with azithromycin and Levaquin and prednisone by PCP on 11/20/2019.  Chest x-ray on admission notable for extensive peripheral opacities left mid lung/lung base consistent with community-acquired pneumonia, although complicated by immunocompromise state and failed outpatient antibiotic treatment with Levaquin/azithromycin.  No focal neurological signs such as nuchal rigidity to suggest meningitis. --Admit to progressive unit --CT head without contrast --CT angiogram chest to rule out PE given hypoxia, tachycardia and AMS --Check TSH, B12, HIV, RPR --Continue restraints for safety, one-to-one sitter --D5 NS at 100 mL/h --Haldol prn for aggitation --N.p.o. until mental status improves --SLP evaluation in a.m. --Supportive care  Sepsis, present on admission Community-acquired pneumonia Acute hypoxic respiratory failure Patient presenting from home via EMS with progressive confusion.  Was found to be febrile 101.4, tachycardic with heart rate up to 127, and hypotensive with BP 97/49.  SPO2 82% on room air.  Chest x-ray notable for extensive peripheral predominant opacities left mid lung/lung base.  Urinalysis unrevealing.  Covid-19 PCR/rapid influenza negative.  Failed outpatient treatment with azithromycin/Levaquin.  --Procalcitonin --CTA chest as above --Check MRSA PCR --Blood cultures x2: Pending --Urine culture: Pending --Sputum culture --Strep pneumo/Legionella urinary antigen: Pending --Continue empiric vancomycin/Zosyn;  pharmacy consulted for dosing/monitoring --Supportive care  CLL, small B-cell type Follows with medical oncology outpatient, Dr. Alvy Bimler.  Currently on chemotherapy with Calquence. --Hold Calquence --Continue acyclovir  Type 2 diabetes mellitus Metformin 500 mg every morning, 1000 g every afternoon at home. --Check hemoglobin A1c --Insulin/scale for coverage while inpatient --CBGs before every meal/at bedtime  COPD No wheezing on physical exam. --Continue Breo Ellipta --Albuterol/Atrovent nebs every 6 hours as needed --Continue supple oxygen, maintain SPO2 greater than 88%  Peripheral neuropathy Follows with neurology outpatient. --Continue Lyrica 200 mg p.o. 3 times daily  Weakness/debility/deconditioning --PT/OT consultation in a.m.   DVT prophylaxis: Lovenox Code Status: Full code Family Communication: Updated patient's spouse [place] via telephone Disposition Plan: To be determined Consults called: None Admission status: Inpatient   Severity of Illness: The appropriate patient status for this patient is INPATIENT. Inpatient status is judged to be reasonable and necessary in order to provide the required intensity of service to ensure the patient's safety. The patient's presenting symptoms, physical exam findings, and initial radiographic and laboratory data in the context of their chronic comorbidities is felt to place them at high risk for further clinical deterioration. Furthermore, it is not anticipated that the patient will be medically stable for discharge from the hospital within 2 midnights of admission. The following factors support the patient status of inpatient.   " The patient's presenting symptoms include altered mental status " The worrisome physical exam findings include fever, tachycardia, hypotension, hypoxia " The initial radiographic and laboratory data are worrisome because of pancytopenia, chest x-ray findings of left-sided pneumonia " The chronic  co-morbidities include CLL on chemotherapy, COPD, type 2 diabetes mellitus, peripheral neuropathy.   * I certify that at the point of admission it is my clinical judgment that the patient will require inpatient hospital care spanning beyond 2 midnights from the point of  admission due to high intensity of service, high risk for further deterioration and high frequency of surveillance required.*    Joby Hershkowitz J [place] DO [facility] Available via Epic secure chat 7am-7pm After these hours, please refer to coverage provider listed on [email or web address] 11/28/2019, 5:04 PM

## 2019-11-28 NOTE — ED Provider Notes (Signed)
[facility] EMERGENCY DEPARTMENT Provider Note   CSN: [id number] Arrival date & time: 11/28/19  1106     History Chief Complaint  Patient presents with  . Weakness    Ryan Copeland is a 69 y.o. male.  Patient is a 69 year old male with past medical history of COPD, coronary artery disease, CLL, diabetes.  He presents today for evaluation of fever and not feeling well for the past 5 days.  He does report some cough that is nonproductive.  From what I am told, he saw his primary doctor for this and had an abnormal chest x-ray for which he is scheduled for a biopsy.  EMS found him at home with oxygen saturations of 82% on room air with hypotension and fever of 101.4.  Other than cough, patient describes no other symptoms that would explain his fever such as abdominal pain, diarrhea, dysuria.  The history is provided by the patient.  Weakness Severity:  Moderate Onset quality:  Sudden Duration:  5 days Timing:  Constant Progression:  Worsening Chronicity:  New Relieved by:  Nothing Worsened by:  Nothing Ineffective treatments:  None tried      Past Medical History:  Diagnosis Date  . Anxiety 01/30/2014  . Back injury    lower disc  . CAD (coronary artery disease)   . CLL (chronic lymphocytic leukemia) ([place]) 03/18/2011  . COPD (chronic obstructive pulmonary disease) ([place])   . Diabetes mellitus ([place])    Type 2   . ECRB (extensor carpi radialis brevis) tenosynovitis   . GERD (gastroesophageal reflux disease)    takes Nexium if needed  . Hyperlipidemia   . Lateral epicondylitis of left elbow   . MI, acute, non ST segment elevation ([place]) 06/28/2009   with stenting of the LAD  . Neuromuscular disorder ([place])    peripheral neuropathy  . PVD (peripheral vascular disease) ([place])   . Tobacco abuse     Patient Active Problem List   Diagnosis Date Noted  . Cerebral vascular disease 11/25/2019  . 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 Other fatigue 10/25/2019  . Deficiency anemia 10/25/2019  . Dizziness 10/22/2019  . Gait abnormality 10/22/2019  . Hx of nonmelanoma skin cancer 03/15/2019  . Essential hypertension 04/24/2018  . Influenza A 04/17/2018  . Sepsis ([place]) 04/17/2018  . Chronic diastolic CHF (congestive heart failure) ([place]) 04/17/2018  . Skin rash 08/07/2017  . Port-A-Cath in place 08/04/2017  . 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 Retained dental roots 04/26/2017  . Dental caries 04/26/2017  . 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 Loose, teeth 04/26/2017  . Peripheral polyneuropathy 04/14/2017  . COPD mixed type ([place]) 02/23/2017  . Chronic sinusitis 01/27/2017  . Splenomegaly, congestive, chronic 12/29/2016  . Diarrhea 12/01/2016  . Poor dentition 12/01/2016  . Benign mole 12/01/2016  . Pancytopenia, acquired ([place]) 11/03/2016  . Odynophagia 01/15/2016  . Polycythemia, secondary 06/26/2015  . Quality of life palliative care encounter 06/26/2015  . Atherosclerosis of extremity with intermittent claudication ([place]) 01/23/2015  . Lateral epicondylitis of left elbow   . ECRB (extensor carpi radialis brevis) tenosynovitis   . Anxiety 01/30/2014  . Preoperative clearance 01/30/2014  . Thrombocytopenia ([place]) 01/30/2014  . Diabetes mellitus without complication ([place]) 24/10/7351  . Hypogammaglobulinemia, acquired ([place]) 12/26/2012  . Hereditary and idiopathic peripheral neuropathy 08/20/2012  . 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[place]) 08/20/2012  . CAD (coronary artery disease) 07/29/2011  . 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 MI, acute, non ST segment elevation ([place])   . Hyperlipidemia   . PVD (peripheral vascular disease) ([place])   . GERD 09/27/2008  . SHOULDER STRAIN, RIGHT 09/27/2008    Past Surgical History:  Procedure Laterality Date  . ADENOIDECTOMY  1955  . CARDIAC CATHETERIZATION    . CARDIAC CATHETERIZATION N/A 09/26/2014    Procedure: Left Heart Cath and Coronary Angiography;  Surgeon: Peter M Martinique, MD;  Location: [facility];  Service: Cardiovascular;  Laterality: N/A;  . carpel tunnel release Left 04-1989  . carpel tunnel release  Right 01-1989  . CHOLECYSTECTOMY  2007  . CORONARY STENT PLACEMENT  May 2011  . femoral stents    . IR CV LINE INJECTION  08/18/2017  . IR CV LINE INJECTION  09/01/2017  . IR CV LINE INJECTION  02/02/2018  . LATERAL EPICONDYLE RELEASE Left 02/12/2014   Procedure: LEFT ELBOW DEBRIDEMENT WITH TENDON REPAIR ;  Surgeon: Lorn Junes, MD;  Location: [facility];  Service: Orthopedics;  Laterality: Left;  . LEFT CAI STENT/PTA AND POPLITEAL ARTERY/TIBIAL THROMBECTOMY     . LEFT HEART CATHETERIZATION WITH CORONARY ANGIOGRAM N/A 08/26/2011   Procedure: LEFT HEART CATHETERIZATION WITH CORONARY ANGIOGRAM;  Surgeon: Peter M Martinique, MD;  Location: [facility] CATH LAB;  Service: Cardiovascular;  Laterality: N/A;  . PERIPHERAL VASCULAR CATHETERIZATION N/A 01/01/2015   Procedure: Abdominal Aortogram;  Surgeon: Conrad Hillview, MD;  Location: [facility];  Service: Cardiovascular;  Laterality: N/A;  . TARSAL TUNNEL RELEASE Bilateral 08-2007       Family History  Problem Relation Age of Onset  . Lung cancer Mother 58  . Cancer Mother        lung  . Heart failure Father 73  . Heart disease Father     Social History   Tobacco Use  . Smoking status: Former Smoker    Packs/day: 1.00    Years: 44.00    Pack years: 44.00    Types: Cigarettes    Quit date: 04/23/2016    Years since quitting: 3.6  . Smokeless tobacco: Former Network engineer  . Vaping Use: Never used  Substance Use Topics  . Alcohol use: No    Alcohol/week: 0.0 standard drinks    Comment:  drinks non-alcoholic beer  . Drug use: No    Home Medications Prior to Admission medications   Medication Sig Start Date End Date Taking? Authorizing Provider  acyclovir (ZOVIRAX) 400 MG tablet TAKE 1 TABLET BY MOUTH TWICE A DAY  07/24/19   [name], MD  albuterol (PROVENTIL HFA) 108 (90 Base) MCG/ACT inhaler TAKE 2 PUFFS BY MOUTH EVERY 6 HOURS AS NEEDED FOR WHEEZE OR SHORTNESS OF BREATH 06/27/19   Spero Geralds, MD  allopurinol (ZYLOPRIM) 300 MG tablet TAKE 1 TABLET BY MOUTH EVERY DAY 09/23/19   [name], MD  ALPRAZolam Duanne Moron) 0.25 MG tablet Take 0.5 mg by mouth as needed for anxiety.     [provider]  azithromycin (ZITHROMAX) 250 MG tablet Take 250-500 mg by mouth See admin instructions. Take 2 tablets (500mg ) by mouth on Day 1, then take 1 tablet (250mg ) by mouth on Days 2-5. 11/20/19   [provider]  b complex vitamins tablet Take 1 tablet by mouth daily.     [provider]  benzonatate (TESSALON) 100 MG capsule Take 2 capsules (200 mg total) by mouth 3 (three) times daily as needed for cough. 11/14/19   Spero Geralds,  MD  CALQUENCE 100 MG capsule TAKE 100 MG BY MOUTH 2 (TWO) TIMES DAILY. 04/11/19   [name], MD  Cholecalciferol (VITAMIN D-1000 MAX ST) 1000 units tablet Take 1,000 Units by mouth daily.     [provider]  famotidine (PEPCID) 20 MG tablet TAKE 1 TABLET BY MOUTH TWICE A DAY 02/11/19   Gorsuch, Ni, MD  fluticasone furoate-vilanterol (BREO ELLIPTA) 200-25 MCG/INH AEPB Inhale 1 puff into the lungs daily. 06/27/19   Spero Geralds, MD  furosemide (LASIX) 20 MG tablet TAKE 1 TABLET BY MOUTH EVERY DAY AS NEEDED 04/23/19   Burtis Junes, NP  HYDROcodone-acetaminophen (NORCO) 10-325 MG tablet Take 1 tablet by mouth See admin instructions. Every 6 to 8 hours as needed for pain 04/14/15   [provider]  lidocaine-prilocaine (EMLA) cream APPLY TO AFFECTED AREA ONCE 12/21/17   Nicholas Lose, MD  loperamide (IMODIUM) 2 MG capsule Take 2 capsules (4 mg total) by mouth every 6 (six) hours as needed for diarrhea or loose stools. 10/25/19   [name], MD  meclizine (ANTIVERT) 12.5 MG tablet Take 12.5 mg by mouth every 6 (six) hours as needed for dizziness.  12/24/14    [provider]  metFORMIN (GLUCOPHAGE-XR) 500 MG 24 hr tablet Take 500 mg by mouth See admin instructions. Takes one tablet in AM and 2 tablets at night.    [provider]  mometasone (NASONEX) 50 MCG/ACT nasal spray Place 2 sprays into the nose daily. 11/08/19   Spero Geralds, MD  montelukast (SINGULAIR) 10 MG tablet Take 1 tablet (10 mg total) by mouth daily. 06/27/19   Spero Geralds, MD  nitroGLYCERIN (NITROSTAT) 0.4 MG SL tablet PLACE 1 TABLET UNDER THE TONGUE EVERY 5 MINUTES X 3 DOSES AS NEEDED FOR CHEST PAIN *MAX 3 DOSES* 10/03/19   Burtis Junes, NP  predniSONE (DELTASONE) 20 MG tablet Take 20 mg by mouth See admin instructions. Take 1 tablet (20mg ) by mouth three times daily for 3 days, then take 1 tablet (20mg ) by mouth twice daily for 3 days, then take 1 tablet by mouth daily for 3 days. 11/20/19   [provider]  pregabalin (LYRICA) 200 MG capsule TAKE 1 CAPSULE (200 MG TOTAL) BY MOUTH 3 (THREE) TIMES DAILY. 09/02/19   Suzzanne Cloud, NP  propranolol (INDERAL) 20 MG tablet Take 1 tablet (20 mg total) by mouth 2 (two) times daily. 10/22/19   Marcial Pacas, MD  rosuvastatin (CRESTOR) 5 MG tablet TAKE 1 TABLET BY MOUTH EVERY DAY 07/24/19   Burtis Junes, NP  sildenafil (VIAGRA) 100 MG tablet Take 0.5-1 tablets (50-100 mg total) by mouth as needed for erectile dysfunction. 06/12/18   Burtis Junes, NP  zolpidem (AMBIEN) 10 MG tablet Take 10 mg by mouth at bedtime as needed for sleep.    [provider]    Allergies    Privigen [immune globulin] and Codeine  Review of Systems   Review of Systems  Neurological: Positive for weakness.  All other systems reviewed and are negative.   Physical Exam Updated Vital Signs BP (!) 97/49 (BP Location: Right Arm)   Pulse 100   Temp (!) 100.6 F (38.1 C) (Axillary)   Resp (!) 22   Ht 6' (1.829 m)   Wt 72.6 kg   SpO2 94%   BMI 21.70 kg/m   Physical Exam Vitals and nursing note reviewed.    Constitutional:      General: He is not in acute  distress.    Appearance: He is well-developed. He is ill-appearing. He is not diaphoretic.  HENT:     Head: Normocephalic and atraumatic.     Mouth/Throat:     Mouth: Mucous membranes are dry.  Cardiovascular:     Rate and Rhythm: Normal rate and regular rhythm.     Heart sounds: No murmur heard.  No friction rub.  Pulmonary:     Effort: Pulmonary effort is normal. No respiratory distress.     Breath sounds: Normal breath sounds. No wheezing or rales.  Abdominal:     General: Bowel sounds are normal. There is no distension.     Palpations: Abdomen is soft.     Tenderness: There is no abdominal tenderness.  Musculoskeletal:        General: Normal range of motion.     Cervical back: Normal range of motion and neck supple.  Skin:    General: Skin is warm and dry.  Neurological:     Mental Status: He is alert and oriented to person, place, and time.     Coordination: Coordination normal.     ED Results / Procedures / Treatments   Labs (all labs ordered are listed, but only abnormal results are displayed) Labs Reviewed  CULTURE, BLOOD (SINGLE)  URINE CULTURE  LACTIC ACID, PLASMA  LACTIC ACID, PLASMA  COMPREHENSIVE METABOLIC PANEL  CBC WITH DIFFERENTIAL/PLATELET  PROTIME-INR  APTT  URINALYSIS, ROUTINE W REFLEX MICROSCOPIC    EKG EKG Interpretation  Date/Time:  Thursday November 28 2019 13:32:35 EDT Ventricular Rate:  100 PR Interval:    QRS Duration: 87 QT Interval:  321 QTC Calculation: 414 R Axis:   85 Text Interpretation: Sinus tachycardia Borderline right axis deviation Borderline low voltage, extremity leads No significant change since 04/17/2018 Confirmed by Veryl Speak [phone]) on 11/28/2019 4:26:01 PM   Radiology No results found.  Procedures Procedures (including critical care time)  Medications Ordered in ED Medications - No data to display  ED Course  I have reviewed the triage vital signs and the  nursing notes.  Pertinent labs & imaging results that were available during my care of the patient were reviewed by me and considered in my medical decision making (see chart for details).    MDM Rules/Calculators/A&P  Patient is a 69 year old male with history of CLL, COPD, coronary artery disease presenting with complaints of a 1 month decline/weakness after receiving his second COVID-19 vaccination.  According to the patient's wife, he has been more sluggish over the past few days.  This morning, he actually fell.  The fall was unwitnessed and wife does not know if he bumped his head.  He was found by EMS to be febrile, hypotensive, and hypoxic.  He was placed on oxygen, then transported here.  Patient saturations upon arrival on room air were 82%.  He remains febrile with a blood pressure in the mid 90s.  For the above reasons, a code sepsis was initiated and cultures of the blood, urine, and chest x-ray were obtained.  He does take a chemotherapy pill, whether this causes immunosuppression or not I am uncertain.  I did administer broad-spectrum antibiotics in a prompt fashion to cover for possible bacterial infection.  Patient's chest x-ray shows what appears to be a left-sided pneumonia, however his Covid test is negative.  IV fluids were administered cautiously due to his history of coronary artery disease.  After receiving 1 L of normal saline, his lactate returned at 1.7 and blood pressure improved to  the 289'K systolic.    Patient symptoms appear to be related to a pneumonia.  He has displayed some agitation and confusion here in the ER and did require sedation with Ativan and Geodon.  He is now more somnolent.  At this point, admission was discussed with the hospitalist who agrees to admit, but would like additional studies including a head CT and CTA of the chest.  Patient to undergo the studies, then will be admitted to the hospitalist service.  CRITICAL CARE Performed by: Veryl Speak Total critical care time: 70 minutes Critical care time was exclusive of separately billable procedures and treating other patients. Critical care was necessary to treat or prevent imminent or life-threatening deterioration. Critical care was time spent personally by me on the following activities: development of treatment plan with patient and/or surrogate as well as nursing, discussions with consultants, evaluation of patient's response to treatment, examination of patient, obtaining history from patient or surrogate, ordering and performing treatments and interventions, ordering and review of laboratory studies, ordering and review of radiographic studies, pulse oximetry and re-evaluation of patient's condition.  Final Clinical Impression(s) / ED Diagnoses Final diagnoses:  None    Rx / DC Orders ED Discharge Orders    None       Veryl Speak, MD 11/28/19 1630

## 2019-11-28 NOTE — ED Notes (Signed)
Pt became agitated 1300, attempting to climb out of bed, pushing against staff. During this, wife mentions pt fell this AM, notes a bruis on his forehead. Dr. Stark Jock notified of both. Head CT and ativan ordered, Dr. Stark Jock at bedside with wife to discuss additiona details. 1mg  as initially verbally ordered not effective, 2nd 1mg  (total of 2mg ) ativan given per Dr. Stark Jock. Still not effective. Geodon ordered and soft restraints initiated for pt safety.

## 2019-11-28 NOTE — ED Notes (Signed)
Pt still too agitated for CT, additional geodon admin, see mar

## 2019-11-28 NOTE — Telephone Encounter (Signed)
Patient's wife called me and left a voice mail stating patient cannot walk . Patient is in the ER. Patient's would like for you to call her Sharyn Lull.  [phone] .

## 2019-11-28 NOTE — ED Notes (Signed)
Pt still actively moving around in bed and refusing to remain still.

## 2019-11-28 NOTE — Progress Notes (Signed)
Pharmacy Antibiotic Note  Ryan Copeland is a 69 y.o. male admitted on 11/28/2019 with sepsis.  Pharmacy has been consulted for vancomycin/zosyn dosing. Tmax/24h 100.6. SCr 1.01 on admit.  Plan: Zosyn 3.375g IV (30min infusion) x1; then 3.375g IV q8h (4h infusion) Vancomycin 1500mg  IV x1; then 1mg  IV q12h Monitor clinical progress, c/s, renal function F/u de-escalation plan/LOT, vancomycin levels as indicated   Height: 6' (182.9 cm) Weight: 72.6 kg (160 lb) IBW/kg (Calculated) : 77.6  Temp (24hrs), Avg:100.6 F (38.1 C), Min:100.6 F (38.1 C), Max:100.6 F (38.1 C)  No results for input(s): WBC, CREATININE, LATICACIDVEN, VANCOTROUGH, VANCOPEAK, VANCORANDOM, GENTTROUGH, GENTPEAK, GENTRANDOM, TOBRATROUGH, TOBRAPEAK, TOBRARND, AMIKACINPEAK, AMIKACINTROU, AMIKACIN in the last 168 hours.  CrCl cannot be calculated (Patient's most recent lab result is older than the maximum 21 days allowed.).    Allergies  Allergen Reactions  . Privigen [Immune Globulin] Hives    Pt reports hives/rash on body with IV Privigen. He was changed to IV Gamunex-C and had many infusions without adverse side effects.  . Codeine Hives    Pt states he can take a few, more reaction with extended doses.    Antimicrobials this admission: 10/14 vancomycin >>  10/14 zosyn >>   Dose adjustments this admission:   Microbiology results:   Arturo Morton, PharmD, BCPS Please check AMION for all [facility] contact numbers Clinical Pharmacist 11/28/2019 12:46 PM

## 2019-11-28 NOTE — Telephone Encounter (Addendum)
I returned the call to his wife at [phone].   He got up around 2am to go the bathroom and fell. He was not able to get back to bed due to his severe extremity weakness. He wanted to rest in floor. About 2.5 hours later, he got up and went back to bed. He woke up around 6am and the same thing happened once he tried to walk.  He had acute worsening of weakness, confusion (incoherent), tremors and running fever.   He is currently in the ED and wanted Dr. Krista Blue to be aware of this situation. She is extremely concerned about his health.  She would like his LP canceled on 12/05/19.

## 2019-11-28 NOTE — Telephone Encounter (Signed)
Noted called Radiology LP CX.

## 2019-11-29 ENCOUNTER — Inpatient Hospital Stay (HOSPITAL_COMMUNITY): Payer: Medicare Other

## 2019-11-29 DIAGNOSIS — A419 Sepsis, unspecified organism: Principal | ICD-10-CM

## 2019-11-29 DIAGNOSIS — I2583 Coronary atherosclerosis due to lipid rich plaque: Secondary | ICD-10-CM

## 2019-11-29 DIAGNOSIS — I2510 Atherosclerotic heart disease of native coronary artery without angina pectoris: Secondary | ICD-10-CM

## 2019-11-29 DIAGNOSIS — F419 Anxiety disorder, unspecified: Secondary | ICD-10-CM

## 2019-11-29 DIAGNOSIS — C8308 Small cell B-cell lymphoma, lymph nodes of multiple sites: Secondary | ICD-10-CM

## 2019-11-29 DIAGNOSIS — I739 Peripheral vascular disease, unspecified: Secondary | ICD-10-CM

## 2019-11-29 DIAGNOSIS — J449 Chronic obstructive pulmonary disease, unspecified: Secondary | ICD-10-CM

## 2019-11-29 DIAGNOSIS — G629 Polyneuropathy, unspecified: Secondary | ICD-10-CM

## 2019-11-29 DIAGNOSIS — K219 Gastro-esophageal reflux disease without esophagitis: Secondary | ICD-10-CM

## 2019-11-29 DIAGNOSIS — E119 Type 2 diabetes mellitus without complications: Secondary | ICD-10-CM

## 2019-11-29 LAB — COMPREHENSIVE METABOLIC PANEL
ALT: 18 U/L (ref 0–44)
AST: 31 U/L (ref 15–41)
Albumin: 2.5 g/dL — ABNORMAL LOW (ref 3.5–5.0)
Alkaline Phosphatase: 47 U/L (ref 38–126)
Anion gap: 10 (ref 5–15)
BUN: 16 mg/dL (ref 8–23)
CO2: 24 mmol/L (ref 22–32)
Calcium: 8.2 mg/dL — ABNORMAL LOW (ref 8.9–10.3)
Chloride: 106 mmol/L (ref 98–111)
Creatinine, Ser: 0.92 mg/dL (ref 0.61–1.24)
GFR, Estimated: >60 mL/min (ref >60)
Glucose, Bld: 92 mg/dL (ref 70–99)
Potassium: 3.2 mmol/L — ABNORMAL LOW (ref 3.5–5.1)
Sodium: 140 mmol/L (ref 135–145)
Total Bilirubin: 1.4 mg/dL — ABNORMAL HIGH (ref 0.3–1.2)
Total Protein: 4.4 g/dL — ABNORMAL LOW (ref 6.5–8.1)

## 2019-11-29 LAB — GLUCOSE, CAPILLARY
Glucose-Capillary: 87 mg/dL (ref 70–99)
Glucose-Capillary: 104 mg/dL — ABNORMAL HIGH (ref 70–99)
Glucose-Capillary: 101 mg/dL — ABNORMAL HIGH (ref 70–99)
Glucose-Capillary: 101 mg/dL — ABNORMAL HIGH (ref 70–99)

## 2019-11-29 LAB — CBC
HCT: 34 % — ABNORMAL LOW (ref 39.0–52.0)
Hemoglobin: 11.5 g/dL — ABNORMAL LOW (ref 13.0–17.0)
MCH: 33.7 pg (ref 26.0–34.0)
MCHC: 33.8 g/dL (ref 30.0–36.0)
MCV: 99.7 fL (ref 80.0–100.0)
Platelets: 52 10*3/uL — ABNORMAL LOW (ref 150–400)
RBC: 3.41 MIL/uL — ABNORMAL LOW (ref 4.22–5.81)
RDW: 16.6 % — ABNORMAL HIGH (ref 11.5–15.5)
WBC: 3.2 10*3/uL — ABNORMAL LOW (ref 4.0–10.5)
nRBC: 0 % (ref 0.0–0.2)

## 2019-11-29 LAB — PROTIME-INR
INR: 1.4 — ABNORMAL HIGH (ref 0.8–1.2)
Prothrombin Time: 16.2 seconds — ABNORMAL HIGH (ref 11.4–15.2)

## 2019-11-29 LAB — RPR: RPR Ser Ql: NONREACTIVE

## 2019-11-29 LAB — PROCALCITONIN: Procalcitonin: 2.21 ng/mL

## 2019-11-29 MED ORDER — CYANOCOBALAMIN 1000 MCG/ML IJ SOLN
1000.0000 ug | Freq: Once | INTRAMUSCULAR | Status: AC
  Administered 2019-11-29: 1000 ug via INTRAMUSCULAR
  Filled 2019-11-29: qty 1

## 2019-11-29 MED ORDER — IOHEXOL 350 MG/ML SOLN
100.0000 mL | Freq: Once | INTRAVENOUS | Status: AC | PRN
  Administered 2019-11-29: 66 mL via INTRAVENOUS

## 2019-11-29 MED ORDER — POTASSIUM CHLORIDE 10 MEQ/100ML IV SOLN
10.0000 meq | INTRAVENOUS | Status: AC
  Administered 2019-11-29 (×4): 10 meq via INTRAVENOUS
  Filled 2019-11-29 (×4): qty 100

## 2019-11-29 MED ORDER — HYDROCODONE-ACETAMINOPHEN 10-325 MG PO TABS
1.0000 | ORAL_TABLET | Freq: Once | ORAL | Status: AC | PRN
  Administered 2019-11-29: 1 via ORAL
  Filled 2019-11-29: qty 1

## 2019-11-29 NOTE — Progress Notes (Signed)
Pt. Removed from 4point restraints. Mittens placed to keep pt from pulling at IV lines. Pt is following commands.

## 2019-11-29 NOTE — Evaluation (Signed)
Clinical/Bedside Swallow Evaluation Patient Details  Name: Ryan Copeland MRN: [id number] Date of Birth: 15-Apr-1950  Today's Date: 11/29/2019 Time: SLP Start Time (ACUTE ONLY): 0908 SLP Stop Time (ACUTE ONLY): 0920 SLP Time Calculation (min) (ACUTE ONLY): 12 min  Past Medical History:  Past Medical History:  Diagnosis Date  . Anxiety 01/30/2014  . Back injury    lower disc  . CAD (coronary artery disease)   . CLL (chronic lymphocytic leukemia) ([place]) 03/18/2011  . COPD (chronic obstructive pulmonary disease) ([place])   . Diabetes mellitus ([place])    Type 2   . ECRB (extensor carpi radialis brevis) tenosynovitis   . GERD (gastroesophageal reflux disease)    takes Nexium if needed  . Hyperlipidemia   . Lateral epicondylitis of left elbow   . MI, acute, non ST segment elevation ([place]) 06/28/2009   with stenting of the LAD  . Neuromuscular disorder ([place])    peripheral neuropathy  . PVD (peripheral vascular disease) ([place])   . Tobacco abuse    Past Surgical History:  Past Surgical History:  Procedure Laterality Date  . ADENOIDECTOMY  1955  . CARDIAC CATHETERIZATION    . CARDIAC CATHETERIZATION N/A 09/26/2014   Procedure: Left Heart Cath and Coronary Angiography;  Surgeon: Peter M Martinique, MD;  Location: [facility] LAB;  Service: Cardiovascular;  Laterality: N/A;  . carpel tunnel release Left 04-1989  . carpel tunnel release  Right 01-1989  . CHOLECYSTECTOMY  2007  . CORONARY STENT PLACEMENT  May 2011  . femoral stents    . IR CV LINE INJECTION  08/18/2017  . IR CV LINE INJECTION  09/01/2017  . IR CV LINE INJECTION  02/02/2018  . LATERAL EPICONDYLE RELEASE Left 02/12/2014   Procedure: LEFT ELBOW DEBRIDEMENT WITH TENDON REPAIR ;  Surgeon: Lorn Junes, MD;  Location: [facility];  Service: Orthopedics;  Laterality: Left;  . LEFT CAI STENT/PTA AND POPLITEAL ARTERY/TIBIAL THROMBECTOMY     . LEFT HEART CATHETERIZATION WITH CORONARY ANGIOGRAM N/A 08/26/2011   Procedure: LEFT HEART  CATHETERIZATION WITH CORONARY ANGIOGRAM;  Surgeon: Peter M Martinique, MD;  Location: [facility] CATH LAB;  Service: Cardiovascular;  Laterality: N/A;  . PERIPHERAL VASCULAR CATHETERIZATION N/A 01/01/2015   Procedure: Abdominal Aortogram;  Surgeon: Conrad Cuero, MD;  Location: [facility] LAB;  Service: Cardiovascular;  Laterality: N/A;  . TARSAL TUNNEL RELEASE Bilateral 08-2007   HPI:  Pt is a 69 y.o. 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  CXR 10/14: Extensive peripheral predominant airspace opacities in the left midlung and lung base, compatible with pneumonia.    Assessment / Plan / Recommendation Clinical Impression  Pt was seen for bedside swallow evaluation and he denied a history of dysphagia; however, his reliability as a historian is questioned. Oral mechanism exam was limited due to pt's difficulty following more complex commands; however, oral motor strength and ROM appeared grossly [facility] and he was edentulous. He tolerated all solids and liquids without signs or symptoms of aspiration. He presents with a likely cognitively-based dysphagia characterized by impairments in bolus awareness, prolonged mastication, and oral residue. A dysphagia 2 diet with thin liquids will be initiated at this time. SLP will follow to assess improvement in swallow function.  SLP Visit Diagnosis: Dysphagia, oral phase (R13.11)    Aspiration Risk  Mild aspiration risk    Diet Recommendation  Dysphagia 2 (Fine chop);Thin liquid   Liquid Administration via: Cup;Straw Medication Administration: Crushed with puree Supervision: Patient able to self feed Compensations: Slow rate;Small sips/bites;Follow solids with liquid Postural Changes: Seated upright at 90 degrees    Other  Recommendations Oral Care Recommendations: Oral care BID   Follow up  Recommendations Other (comment) (TBD)      Frequency and Duration min 2x/week  2 weeks       Prognosis Prognosis for Safe Diet Advancement: Good Barriers to Reach Goals: Cognitive deficits      Swallow Study   General Date of Onset: 11/28/19 HPI: Pt is a 69 y.o. 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  CXR 10/14: Extensive peripheral predominant airspace opacities in the left midlung and lung base, compatible with pneumonia.  Type of Study: Bedside Swallow Evaluation Previous Swallow Assessment: None  Diet Prior to this Study: NPO Temperature Spikes Noted: No Respiratory Status: Room air History of Recent Intubation: No Behavior/Cognition: Pleasant mood;Confused;Requires cueing;Distractible Oral Cavity Assessment: Within Functional Limits Oral Care Completed by SLP: No Oral Cavity - Dentition: Edentulous Vision: Functional for self-feeding Self-Feeding Abilities: Needs assist Patient Positioning: Upright in bed;Postural control adequate for testing Baseline Vocal Quality: Normal Volitional Cough: Cognitively unable to elicit Volitional Swallow: Able to elicit    Oral/Motor/Sensory Function Overall Oral Motor/Sensory Function: Within functional limits   Ice Chips Ice chips: Within functional limits Presentation: Spoon   Thin Liquid Thin Liquid: Within functional limits Presentation: Straw    Nectar Thick Nectar Thick Liquid: Not tested   Honey Thick Honey Thick Liquid: Not tested   Puree Puree: Within functional limits Presentation: Spoon   Solid     Solid: Impaired Presentation: Self Fed Oral Phase Impairments: Impaired mastication, reduced awareness Oral Phase Functional Implications: Impaired mastication;Oral residue     Jakobie Henslee I. Hardin Negus, [place], [place] Office number  [phone] Pager [place] 11/29/2019,9:33 AM

## 2019-11-29 NOTE — Progress Notes (Signed)
Found patient getting up without assistance and turning off his bed alarm. Patient is restless, lethargic, and resistant to interventions to keep him safe. Explained that he has a high risk of falling and he needs to call for help. He stated that he doesn't care about falling and he's going to get up if someone comes to help or not. Patient has all safety interventions (low bed w/rails up, bed alarm, and floor mats) in place.

## 2019-11-29 NOTE — Progress Notes (Signed)
PROGRESS NOTE    KIMM UNGARO  KKX:[id number] DOB: 1950-09-25 DOA: 11/28/2019 PCP: [name]., PA-C   Brief Narrative:  HPI on 11/28/2019 by Dr. Eric [place] Ryan Copeland is a 69 y.o. 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  78 of HPI obtained from patient spouse, ED physician, EMS reports due to patient's altered mental status and inability to participate in HPI.  Apparently, patient has been slowly declining since his recent second Covid-19 vaccination on September 2021.  He was seen by his PCP recently on 11/20/2019 and given treatment with azithromycin, Levaquin and prednisone.  He continues to slowly decline with associated mental status changes, weakness.  No other recent changes in his medications or sick contacts.  Interim history  Admitted with sepsis secondary to pneumonia and AMS. Assessment & Plan   Severe sepsis with acute hypoxic respiratory failure secondary to community-acquired pneumonia -Sepsis present on admission as patient was noted to be febrile with tachycardia.  Chest x-ray on admission showed extensive peripheral opacity in the left midlung/lung base consistent with community-acquired pneumonia.  Patient was also noted to have hypoxia with oxygen saturations of 82% on room air. -patient is also immunocompromised -Patient placed on supplemental oxygen along with IV fluids, vancomycin and Zosyn -Of note, patient failed outpatient treatment with azithromycin and Levaquin -COVID-19 PCR as well as rapid influenza negative -Strep pneumonia urine antigen negative, pending Legionella urine antigen -Blood cultures show no growth to date -UA unremarkable for infection -Urine culture pending -Procalcitonin 2.21 -Sputum culture pending -CTA chest pending  Acute metabolic  encephalopathy -Secondary to the above -Mildly improving -TSH WNL, HIV and RPR nonreactive.  Vitamin B12 106 -Pending CT head -Patient did require several doses of Haldol and was placed in four-point restraints -continue to monitor closely and treat underlying conditions  Vitamin B 12 deficiency -Will order supplemental injections during hospitalization and discharge patient with oral supplementation  CLL, small B-cell type -Patient follows with Dr. Alvy Bimler and currently on chemotherapy Calquence -Chemo currently on hold -Continue acyclovir -Discussed with Dr. Alvy Bimler, patient is doing well with his treatment and is close to remission  COPD -On examination, no wheezing appreciated -Patient being treated for pneumonia with acute hypoxic respiratory failure -Continue Breo Ellipta and nebulizer treatments as needed  Peripheral neuropathy -Patient does follow with a neurologist as an outpatient -Continue Lyrica  Physical deconditioning and debility -PT and OT consulted  DVT Prophylaxis Lovenox  Code Status: Full  Family Communication: None at bedside  Disposition Plan:  Status is: Inpatient  Remains inpatient appropriate because:Altered mental status, IV treatments appropriate due to intensity of illness or inability to take PO and Inpatient level of care appropriate due to severity of illness   Dispo: The patient is from: Home              Anticipated d/c is to: Home              Anticipated d/c date is: 3 days              Patient currently is not medically stable to d/c.   Consultants  Dr. Alvy Bimler, oncology-not officially consulted  Procedures  None  Antibiotics   Anti-infectives (From admission, onward)   Start     Dose/Rate Route Frequency Ordered Stop   11/29/19 0230  vancomycin (VANCOCIN) IVPB 1000 mg/200  mL premix        1,000 mg 200 mL/hr over 60 Minutes Intravenous Every 12 hours 11/28/19 1325     11/28/19 2200  acyclovir (ZOVIRAX) tablet 400 mg         400 mg Oral 2 times daily 11/28/19 1846     11/28/19 2130  piperacillin-tazobactam (ZOSYN) IVPB 3.375 g        3.375 g 12.5 mL/hr over 240 Minutes Intravenous Every 8 hours 11/28/19 1325     11/28/19 1300  vancomycin (VANCOREADY) IVPB 1500 mg/300 mL        1,500 mg 150 mL/hr over 120 Minutes Intravenous  Once 11/28/19 1249 11/28/19 1844   11/28/19 1245  piperacillin-tazobactam (ZOSYN) IVPB 3.375 g        3.375 g 100 mL/hr over 30 Minutes Intravenous  Once 11/28/19 1238 11/28/19 1407   11/28/19 1245  vancomycin (VANCOCIN) IVPB 1000 mg/200 mL premix  Status:  Discontinued        1,000 mg 200 mL/hr over 60 Minutes Intravenous  Once 11/28/19 1238 11/28/19 1249      Subjective:   Andris Baumann seen and examined today.  Patient with no complaints this morning.  Feels that he is doing better.  Understands he is in the hospital and was sick.  Denies current chest pain, abdominal pain, nausea or vomiting, diarrhea or constipation, dizziness or headache.  Objective:   Vitals:   11/29/19 0331 11/29/19 0731 11/29/19 0838 11/29/19 1116  BP: 112/68 108/62  (!) 122/57  Pulse: 90 92  90  Resp: (!) 22 (!) 21  20  Temp: 98.7 F (37.1 C) 98.2 F (36.8 C)  98.1 F (36.7 C)  TempSrc:  Oral  Oral  SpO2: 94% 94% 96% 98%  Weight:      Height:        Intake/Output Summary (Last 24 hours) at 11/29/2019 1204 Last data filed at 11/29/2019 4174 Gross per 24 hour  Intake 587.7 ml  Output 75 ml  Net 512.7 ml   Filed Weights   11/28/19 1122 11/28/19 2123 11/29/19 0050  Weight: 72.6 kg 73 kg 72.1 kg    Exam  General: Well developed, chronically ill-appearing, NAD  HEENT: NCAT, mucous membranes moist.   Cardiovascular: S1 S2 auscultated, RRR, no murmur  Respiratory: Diminished breath sounds with rhonchi and crackles in the left base, no wheezing  Abdomen: Soft, nontender, nondistended, + bowel sounds  Extremities: warm dry without cyanosis clubbing or edema  Neuro: AAOx3 (patient aware  that he is in the hospital and it is October, knows the president), nonfocal although does appear to be very weak  Psych: appropriate mood and affect although mildly confused   Data Reviewed: I have personally reviewed following labs and imaging studies  CBC: Recent Labs  Lab 11/28/19 1131 11/28/19 1846 11/28/19 2304 11/29/19 0402  WBC 3.7* 3.6* 3.0* 3.2*  NEUTROABS 2.4  --   --   --   HGB 12.3* 11.8* 11.5* 11.5*  HCT 38.6* 37.3* 35.4* 34.0*  MCV 102.4* 102.8* 99.7 99.7  PLT PLATELET CLUMPS NOTED ON SMEAR, COUNT APPEARS DECREASED 51* 53* 52*   Basic Metabolic Panel: Recent Labs  Lab 11/28/19 1131 11/28/19 1846 11/29/19 0402  NA 139  --  140  K 3.5  --  3.2*  CL 105  --  106  CO2 26  --  24  GLUCOSE 76  --  92  BUN 16  --  16  CREATININE 1.01 0.96 0.92  CALCIUM  8.3*  --  8.2*   GFR: Estimated Creatinine Clearance: 78.4 mL/min (by C-G formula based on SCr of 0.92 mg/dL). Liver Function Tests: Recent Labs  Lab 11/28/19 1131 11/29/19 0402  AST 20 31  ALT 15 18  ALKPHOS 54 47  BILITOT 0.6 1.4*  PROT 4.8* 4.4*  ALBUMIN 2.8* 2.5*   No results for input(s): LIPASE, AMYLASE in the last 168 hours. No results for input(s): AMMONIA in the last 168 hours. Coagulation Profile: Recent Labs  Lab 11/28/19 1131 11/29/19 0402  INR 1.2 1.4*   Cardiac Enzymes: No results for input(s): CKTOTAL, CKMB, CKMBINDEX, TROPONINI in the last 168 hours. BNP (last 3 results) No results for input(s): PROBNP in the last 8760 hours. HbA1C: No results for input(s): HGBA1C in the last 72 hours. CBG: Recent Labs  Lab 11/28/19 1731 11/28/19 2126 11/29/19 0615 11/29/19 1114  GLUCAP 84 90 87 104*   Lipid Profile: No results for input(s): CHOL, HDL, LDLCALC, TRIG, CHOLHDL, LDLDIRECT in the last 72 hours. Thyroid Function Tests: Recent Labs    11/28/19 1922  TSH 1.020   Anemia Panel: Recent Labs    11/28/19 1846  VITAMINB12 106*   Urine analysis:    Component Value  Date/Time   COLORURINE YELLOW 11/28/2019 [place] 11/28/2019 1127   LABSPEC 1.010 11/28/2019 1127   PHURINE 5.0 11/28/2019 1127   GLUCOSEU NEGATIVE 11/28/2019 1127   HGBUR NEGATIVE 11/28/2019 1127   [place] 11/28/2019 1127   KETONESUR NEGATIVE 11/28/2019 1127   PROTEINUR NEGATIVE 11/28/2019 1127   UROBILINOGEN 0.2 02/03/2014 1356   NITRITE NEGATIVE 11/28/2019 1127   LEUKOCYTESUR NEGATIVE 11/28/2019 1127   Sepsis Labs: @LABRCNTIP (procalcitonin:4,lacticidven:4)  ) Recent Results (from the past 240 hour(s))  Respiratory Panel by RT PCR (Flu A&B, Covid) -     Status: None   Collection Time: 11/28/19 11:27 AM   Specimen: Nasopharyngeal  Result Value Ref Range Status   SARS Coronavirus 2 by RT PCR NEGATIVE NEGATIVE Final    Comment: (NOTE) SARS-CoV-2 target nucleic acids are NOT DETECTED.  The SARS-CoV-2 RNA is generally detectable in upper respiratoy specimens during the acute phase of infection. The lowest concentration of SARS-CoV-2 viral copies this assay can detect is 131 copies/mL. A negative result does not preclude SARS-Cov-2 infection and should not be used as the sole basis for treatment or other patient management decisions. A negative result may occur with  improper specimen collection/handling, submission of specimen other than nasopharyngeal swab, presence of viral mutation(s) within the areas targeted by this assay, and inadequate number of viral copies (<131 copies/mL). A negative result must be combined with clinical observations, patient history, and epidemiological information. The expected result is Negative.  Fact Sheet for Patients:  PinkCheek.be  Fact Sheet for Healthcare Providers:  GravelBags.it  This test is no t yet approved or cleared by the [place] FDA and  has been authorized for detection and/or diagnosis of SARS-CoV-2 by FDA under an Emergency Use  Authorization (EUA). This EUA will remain  in effect (meaning this test can be used) for the duration of the COVID-19 declaration under Section 564(b)(1) of the Act, 21 U.S.C. section 360bbb-3(b)(1), unless the authorization is terminated or revoked sooner.     Influenza A by PCR NEGATIVE NEGATIVE Final   Influenza B by PCR NEGATIVE NEGATIVE Final    Comment: (NOTE) The Xpert Xpress SARS-CoV-2/FLU/RSV assay is intended as an aid in  the diagnosis of influenza from Nasopharyngeal swab specimens and  should  not be used as a sole basis for treatment. Nasal washings and  aspirates are unacceptable for Xpert Xpress SARS-CoV-2/FLU/RSV  testing.  Fact Sheet for Patients: PinkCheek.be  Fact Sheet for Healthcare Providers: GravelBags.it  This test is not yet approved or cleared by the [place] FDA and  has been authorized for detection and/or diagnosis of SARS-CoV-2 by  FDA under an Emergency Use Authorization (EUA). This EUA will remain  in effect (meaning this test can be used) for the duration of the  Covid-19 declaration under Section 564(b)(1) of the Act, 21  U.S.C. section 360bbb-3(b)(1), unless the authorization is  terminated or revoked. Performed at [facility], [place] [address] [place], [place] [address]   Blood culture (routine single)     Status: None (Preliminary result)   Collection Time: 11/28/19 12:00 PM   Specimen: BLOOD RIGHT ARM  Result Value Ref Range Status   Specimen Description BLOOD RIGHT ARM  Final   Special Requests   Final    BOTTLES DRAWN AEROBIC AND ANAEROBIC Blood Culture adequate volume   Culture   Final    NO GROWTH < 24 HOURS Performed at [facility], [place] [address], [place], [place] [address]    Report Status PENDING  Incomplete  Culture, blood (single)     Status: None (Preliminary result)   Collection Time: 11/28/19  9:38 PM   Specimen: BLOOD  Result Value Ref Range  Status   Specimen Description BLOOD LEFT ANTECUBITAL  Final   Special Requests   Final    BOTTLES DRAWN AEROBIC AND ANAEROBIC Blood Culture results may not be optimal due to an excessive volume of blood received in culture bottles   Culture   Final    NO GROWTH < 12 HOURS Performed at [facility], [place] [address] [place], [place] [address]    Report Status PENDING  Incomplete      Radiology Studies: DG Chest Port 1 View  Result Date: 11/28/2019 CLINICAL DATA:  Questionable sepsis. Patient lethargic and ill for 4 weeks. EXAM: PORTABLE CHEST 1 VIEW COMPARISON:  None. FINDINGS: Extensive peripheral predominant airspace opacities in the left midlung and lung base. No visible pleural effusions or pneumothorax. Mild enlargement the cardiac silhouette. Coronary artery stent. Aortic atherosclerosis. Right approach Port-A-Cath with the tip projecting at the inferior SVC. No acute osseous abnormality. IMPRESSION: 1. Extensive peripheral predominant airspace opacities in the left midlung and lung base, compatible with pneumonia. While nonspecific, consider COVID. 2. Mild cardiomegaly. Electronically Signed   By: Margaretha Sheffield MD   On: 11/28/2019 12:27     Scheduled Meds: . acyclovir  400 mg Oral BID  . allopurinol  300 mg Oral Daily  . enoxaparin (LOVENOX) injection  40 mg Subcutaneous Q24H  . fluticasone  2 spray Each Nare Daily  . fluticasone furoate-vilanterol  1 puff Inhalation Daily  . insulin aspart  0-5 Units Subcutaneous QHS  . insulin aspart  0-6 Units Subcutaneous TID WC  . montelukast  10 mg Oral Daily  . pregabalin  200 mg Oral TID  . rosuvastatin  5 mg Oral Daily   Continuous Infusions: . dextrose 5 % and 0.9% NaCl 100 mL/hr at 11/29/19 0128  . piperacillin-tazobactam (ZOSYN)  IV 3.375 g (11/29/19 0405)  . potassium chloride 10 mEq (11/29/19 1147)  . vancomycin 1,000 mg (11/29/19 0133)     LOS: 1 day   Time Spent in minutes   33 minutes  Deaundra Kutzer D.O. on  11/29/2019 at 12:04 PM  Between 7am to 7pm - Please see pager noted on [email or web address]  After 7pm go to [email or web address]  And look for the night coverage person covering for me after hours  [facility] Office  [phone]

## 2019-11-29 NOTE — Progress Notes (Signed)
Received pt with 4point restraints. Patient is still confused and trying to pull IV and catheter lines. PT constantly yelling. I agree with the previous assessment. Monitoring accordingly.

## 2019-11-29 NOTE — Evaluation (Signed)
Physical Therapy Evaluation Patient Details Name: Ryan Copeland MRN: [id number] DOB: 1950/04/11 Today's Date: 11/29/2019   History of Present Illness  Ryan Copeland is a 69 y.o. 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  16 of HPI obtained from patient spouse, ED physician, EMS reports due to patient's altered mental status and inability to participate in HPI.  Apparently, patient has been slowly declining since his recent second Covid-19 vaccination on September 2021.  He was seen by his PCP recently on 11/20/2019 and given treatment with azithromycin, Levaquin and prednisone.  He continues to slowly decline with associated mental status changes, weakness.  No other recent changes in his medications or sick contacts.  Clinical Impression  Pt admitted with above diagnosis. Pt refused OOB but did sit EOB for a few min and stated he was too tired to do more than a few exercises.  Wife committed to caring for pt and has for some time. Will follow acutely.  Pt currently with functional limitations due to the deficits listed below (see PT Problem List). Pt will benefit from skilled PT to increase their independence and safety with mobility to allow discharge to the venue listed below.      Follow Up Recommendations Home health PT;Supervision/Assistance - 24 hour    Equipment Recommendations  Other (comment) (TBA may need RW)    Recommendations for Other Services       Precautions / Restrictions Precautions Precautions: Fall Restrictions Weight Bearing Restrictions: No      Mobility  Bed Mobility Overal bed mobility: Needs Assistance Bed Mobility: Supine to Sit     Supine to sit: Min assist     General bed mobility comments: Pt was able to come to EOB with a little assist for LEs.  Pt c/o fatigue once at EOB and  refused to stand.  Wanted to just lay down.   Transfers                 General transfer comment: refused   Ambulation/Gait                Stairs            Wheelchair Mobility    Modified Rankin (Stroke Patients Only)       Balance Overall balance assessment: Needs assistance Sitting-balance support: No upper extremity supported;Feet supported Sitting balance-Leahy Scale: Fair                                       Pertinent Vitals/Pain Pain Assessment: No/denies pain    Home Living Family/patient expects to be discharged to:: Private residence Living Arrangements: Spouse/significant other Available Help at Discharge: Family;Available 24 hours/day Type of Home: House Home Access: Stairs to enter Entrance Stairs-Rails: Right Entrance Stairs-Number of Steps: 3-4 Home Layout: Two level;Laundry or work area in [employer]: None      Prior Function Level of Independence: Independent         Comments: 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, newspaper        Extremity/Trunk Assessment   Upper Extremity Assessment Upper Extremity Assessment: Defer to OT evaluation    Lower Extremity Assessment Lower Extremity Assessment: Generalized weakness    Cervical / Trunk Assessment Cervical /  Trunk Assessment: Kyphotic  Communication   Communication: No difficulties  Cognition Arousal/Alertness: Lethargic Behavior During Therapy: Flat affect Overall Cognitive Status: Impaired/Different from baseline Area of Impairment: Orientation;Following commands;Safety/judgement;Awareness;Problem solving                 Orientation Level: Disoriented to;Time;Situation     Following Commands: Follows one step commands inconsistently;Follows one step commands with increased time Safety/Judgement: Decreased awareness of safety;Decreased awareness of deficits   Problem Solving: Slow processing;Decreased initiation;Difficulty  sequencing;Requires verbal cues;Requires tactile cues        General Comments      Exercises General Exercises - Lower Extremity Ankle Circles/Pumps: AROM;Both;10 reps;Seated Long Arc Quad: AROM;Both;10 reps;Seated Hip Flexion/Marching: AAROM;Both;5 reps;Seated (could not complete full ROM)   Assessment/Plan    PT Assessment Patient needs continued PT services  PT Problem List Decreased activity tolerance;Decreased balance;Decreased mobility;Decreased knowledge of use of DME;Decreased safety awareness;Decreased knowledge of precautions       PT Treatment Interventions Gait training;DME instruction;Functional mobility training;Therapeutic activities;Therapeutic exercise;Stair training;Balance training;Patient/family education    PT Goals (Current goals can be found in the Care Plan section)  Acute Rehab PT Goals Patient Stated Goal: to go home PT Goal Formulation: With patient Time For Goal Achievement: 12/13/19 Potential to Achieve Goals: Good    Frequency Min 3X/week   Barriers to discharge        Co-evaluation               AM-PAC PT "6 Clicks" Mobility  Outcome Measure Help needed turning from your back to your side while in a flat bed without using bedrails?: A Little Help needed moving from lying on your back to sitting on the side of a flat bed without using bedrails?: A Little Help needed moving to and from a bed to a chair (including a wheelchair)?: A Little Help needed standing up from a chair using your arms (e.g., wheelchair or bedside chair)?: Total Help needed to walk in hospital room?: Total Help needed climbing 3-5 steps with a railing? : Total 6 Click Score: 12    End of Session Equipment Utilized During Treatment: Gait belt Activity Tolerance: Patient limited by fatigue Patient left: in bed;with call bell/phone within reach;with bed alarm set Nurse Communication: Mobility status PT Visit Diagnosis: Unsteadiness on feet (R26.81);Muscle  weakness (generalized) (M62.81)    Time: 5462-7035 PT Time Calculation (min) (ACUTE ONLY): 20 min   Charges:   PT Evaluation $PT Eval Moderate Complexity: 1 Mod          Andric Kerce W,PT Acute Rehabilitation Services Pager:  [phone]  Office:  [place] 11/29/2019, 4:00 PM

## 2019-11-29 NOTE — Progress Notes (Signed)
OT Cancellation Note  Patient Details Name: Ryan Copeland MRN: [id number] DOB: 05/27/1950   Cancelled Treatment:    Reason Eval/Treat Not Completed: Fatigue/lethargy limiting ability to participate (Pt with restless morning and now sleeping, did not disturb.) Will follow.  [name] 11/29/2019, 11:45 AM  Nestor Lewandowsky, OTR/L Acute Rehabilitation Services Pager: [phone] Office: [phone]

## 2019-11-30 LAB — URINE CULTURE: Culture: NO GROWTH

## 2019-11-30 LAB — COMPREHENSIVE METABOLIC PANEL
ALT: 18 U/L (ref 0–44)
AST: 24 U/L (ref 15–41)
Albumin: 2.5 g/dL — ABNORMAL LOW (ref 3.5–5.0)
Alkaline Phosphatase: 47 U/L (ref 38–126)
Anion gap: 11 (ref 5–15)
BUN: 11 mg/dL (ref 8–23)
CO2: 26 mmol/L (ref 22–32)
Calcium: 8.2 mg/dL — ABNORMAL LOW (ref 8.9–10.3)
Chloride: 105 mmol/L (ref 98–111)
Creatinine, Ser: 1.02 mg/dL (ref 0.61–1.24)
GFR, Estimated: >60 mL/min (ref >60)
Glucose, Bld: 107 mg/dL — ABNORMAL HIGH (ref 70–99)
Potassium: 2.8 mmol/L — ABNORMAL LOW (ref 3.5–5.1)
Sodium: 142 mmol/L (ref 135–145)
Total Bilirubin: 1.5 mg/dL — ABNORMAL HIGH (ref 0.3–1.2)
Total Protein: 4.6 g/dL — ABNORMAL LOW (ref 6.5–8.1)

## 2019-11-30 LAB — CBC
HCT: 33.5 % — ABNORMAL LOW (ref 39.0–52.0)
Hemoglobin: 10.9 g/dL — ABNORMAL LOW (ref 13.0–17.0)
MCH: 32.7 pg (ref 26.0–34.0)
MCHC: 32.5 g/dL (ref 30.0–36.0)
MCV: 100.6 fL — ABNORMAL HIGH (ref 80.0–100.0)
Platelets: 47 10*3/uL — ABNORMAL LOW (ref 150–400)
RBC: 3.33 MIL/uL — ABNORMAL LOW (ref 4.22–5.81)
RDW: 16.9 % — ABNORMAL HIGH (ref 11.5–15.5)
WBC: 3 10*3/uL — ABNORMAL LOW (ref 4.0–10.5)
nRBC: 0 % (ref 0.0–0.2)

## 2019-11-30 LAB — GLUCOSE, CAPILLARY
Glucose-Capillary: 100 mg/dL — ABNORMAL HIGH (ref 70–99)
Glucose-Capillary: 138 mg/dL — ABNORMAL HIGH (ref 70–99)
Glucose-Capillary: 120 mg/dL — ABNORMAL HIGH (ref 70–99)
Glucose-Capillary: 90 mg/dL (ref 70–99)

## 2019-11-30 LAB — MAGNESIUM: Magnesium: 1.7 mg/dL (ref 1.7–2.4)

## 2019-11-30 LAB — HEMOGLOBIN A1C
Hgb A1c MFr Bld: 5.1 % (ref 4.8–5.6)
Mean Plasma Glucose: 100 mg/dL

## 2019-11-30 LAB — MRSA PCR SCREENING: MRSA by PCR: NEGATIVE

## 2019-11-30 LAB — PROCALCITONIN: Procalcitonin: 1.47 ng/mL

## 2019-11-30 MED ORDER — HYDROCODONE-ACETAMINOPHEN 10-325 MG PO TABS
1.0000 | ORAL_TABLET | Freq: Four times a day (QID) | ORAL | Status: DC | PRN
  Administered 2019-12-01 – 2019-12-05 (×12): 1 via ORAL
  Filled 2019-11-30 (×12): qty 1

## 2019-11-30 MED ORDER — SODIUM CHLORIDE 0.9% FLUSH
10.0000 mL | Freq: Two times a day (BID) | INTRAVENOUS | Status: DC
  Administered 2019-12-01 – 2019-12-04 (×3): 10 mL
  Administered 2019-12-04: 20 mL
  Administered 2019-12-05: 10 mL

## 2019-11-30 MED ORDER — MAGNESIUM SULFATE 2 GM/50ML IV SOLN
2.0000 g | Freq: Once | INTRAVENOUS | Status: AC
  Administered 2019-11-30: 2 g via INTRAVENOUS
  Filled 2019-11-30: qty 50

## 2019-11-30 MED ORDER — SODIUM CHLORIDE 0.9 % IV BOLUS: 500.0000 mL | Freq: Once | INTRAVENOUS | Status: DC

## 2019-11-30 MED ORDER — SODIUM CHLORIDE 0.9 % IV BOLUS: 1000.0000 mL | Freq: Once | INTRAVENOUS | Status: DC

## 2019-11-30 MED ORDER — SODIUM CHLORIDE 0.9% FLUSH: 10.0000 mL | INTRAVENOUS | Status: DC | PRN

## 2019-11-30 MED ORDER — POTASSIUM CHLORIDE 10 MEQ/100ML IV SOLN
10.0000 meq | INTRAVENOUS | Status: AC
  Administered 2019-11-30 (×4): 10 meq via INTRAVENOUS
  Filled 2019-11-30 (×4): qty 100

## 2019-11-30 MED ORDER — CHLORHEXIDINE GLUCONATE CLOTH 2 % EX PADS
6.0000 | MEDICATED_PAD | Freq: Every day | CUTANEOUS | Status: DC
  Administered 2019-11-30 – 2019-12-05 (×6): 6 via TOPICAL

## 2019-11-30 MED ORDER — LIDOCAINE HCL URETHRAL/MUCOSAL 2 % EX GEL
1.0000 "application " | Freq: Once | CUTANEOUS | Status: AC
  Administered 2019-11-30: 1 via URETHRAL
  Filled 2019-11-30: qty 11

## 2019-11-30 MED ORDER — POTASSIUM CHLORIDE CRYS ER 20 MEQ PO TBCR
40.0000 meq | EXTENDED_RELEASE_TABLET | Freq: Two times a day (BID) | ORAL | Status: AC
  Administered 2019-11-30 (×2): 40 meq via ORAL
  Filled 2019-11-30 (×3): qty 2

## 2019-11-30 MED ORDER — SODIUM CHLORIDE 0.9 % IV SOLN
INTRAVENOUS | Status: DC | PRN
  Administered 2019-11-30: 1000 mL via INTRAVENOUS

## 2019-11-30 MED ORDER — TAMSULOSIN HCL 0.4 MG PO CAPS
0.4000 mg | ORAL_CAPSULE | Freq: Every day | ORAL | Status: DC
  Administered 2019-11-30 – 2019-12-05 (×6): 0.4 mg via ORAL
  Filled 2019-11-30 (×6): qty 1

## 2019-11-30 MED ORDER — CYANOCOBALAMIN 1000 MCG/ML IJ SOLN
1000.0000 ug | Freq: Once | INTRAMUSCULAR | Status: AC
  Administered 2019-11-30: 1000 ug via INTRAMUSCULAR
  Filled 2019-11-30: qty 1

## 2019-11-30 MED ORDER — SODIUM CHLORIDE 0.9 % IV BOLUS
500.0000 mL | Freq: Once | INTRAVENOUS | Status: AC
  Administered 2019-11-30: 500 mL via INTRAVENOUS

## 2019-11-30 NOTE — Progress Notes (Signed)
   11/29/19 2250  Assess: MEWS Score  Temp 98.6 F (37 C)  BP (!) 80/49  Pulse Rate 60  ECG Heart Rate 63  Resp 16  Level of Consciousness Responds to Voice  Assess: MEWS Score  MEWS Temp 0  MEWS Systolic 2  MEWS Pulse 0  MEWS RR 0  MEWS LOC 1  MEWS Score 3  MEWS Score Color Yellow  Assess: if the MEWS score is Yellow or Red  Were vital signs taken at a resting state? Yes  Focused Assessment No change from prior assessment  Early Detection of Sepsis Score *See Row Information* Low  MEWS guidelines implemented *See Row Information* No, vital signs rechecked  Treat  MEWS Interventions Administered scheduled meds/treatments  Pain Scale 0-10  Pain Score 1  Pain Type Chronic pain  Pain Location Back  Pain Orientation Lower  Pain Descriptors / Indicators Sore  Pain Frequency Intermittent  Pain Onset On-going  Patients Stated Pain Goal 1  Pain Intervention(s) Emotional support;Repositioned  Multiple Pain Sites No  Complains of Other (Comment) (None)  Interventions Relaxation  Patients response to intervention Relief  Take Vital Signs  Increase Vital Sign Frequency  Yellow: Q 2hr X 2 then Q 4hr X 2, if remains yellow, continue Q 4hrs  Escalate  MEWS: Escalate Yellow: discuss with charge nurse/RN and consider discussing with provider and RRT  Notify: Charge Nurse/RN  Name of Charge Nurse/RN Notified Davis, RN  Date Charge Nurse/RN Notified 11/29/19  Time Charge Nurse/RN Notified 2300  Notify: Provider  Provider Name/Title Dr. Marlowe Sax  Date Provider Notified 11/29/19  Time Provider Notified 2310  Notification Type Page  Notification Reason Other (Comment) (Chronic Yellow MEWs)  Response Other (Comment) (Monitoring pt closely)  Date of Provider Response 11/29/19  Time of Provider Response 2320  Document  Patient Outcome Not stable and remains on department

## 2019-11-30 NOTE — Progress Notes (Signed)
   11/30/19 2322  Urine Characteristics  Intermittent/Straight Cath (mL) 800 mL  Hygiene Peri care  2nd RN present. Urojet applied before insertion.

## 2019-11-30 NOTE — Progress Notes (Addendum)
PROGRESS NOTE    Ryan Copeland  IWL:[id number] DOB: [birth year] DOA: 11/28/2019 PCP: [name]., PA-C   Brief Narrative:  HPI on 11/28/2019 by Dr. Eric [place] Ryan Copeland is a 69 y.o. 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  36 of HPI obtained from patient spouse, ED physician, EMS reports due to patient's altered mental status and inability to participate in HPI.  Apparently, patient has been slowly declining since his recent second Covid-19 vaccination on September 2021.  He was seen by his PCP recently on 11/20/2019 and given treatment with azithromycin, Levaquin and prednisone.  He continues to slowly decline with associated mental status changes, weakness.  No other recent changes in his medications or sick contacts.  Interim history  Admitted with sepsis secondary to pneumonia and AMS. Assessment & Plan   Severe sepsis with acute hypoxic respiratory failure secondary to community-acquired pneumonia -Sepsis present on admission as patient was noted to be febrile with tachycardia.  Chest x-ray on admission showed extensive peripheral opacity in the left midlung/lung base consistent with community-acquired pneumonia.  Patient was also noted to have hypoxia with oxygen saturations of 82% on room air. -patient is also immunocompromised -Patient placed on supplemental oxygen along with IV fluids, vancomycin and Zosyn -Has been weaned to room air -Of note, patient failed outpatient treatment with azithromycin and Levaquin -COVID-19 PCR as well as rapid influenza negative -Strep pneumonia urine antigen negative, pending Legionella urine antigen -Blood cultures show no growth to date -UA unremarkable for infection; Urine culture showed no growth -Procalcitonin 2.21 -CTA chest negative for PE. Bilateral areas  of lung consolidation consistent with multifocal pneumonia, areas are nodular to mass-like suspected to be infectious in etiology. Repeat CT recommended after therapy and symptom improvement.  -Pending MRSA swab- if this returns negative, will discontinue vancomyin  Acute metabolic encephalopathy -Secondary to the above -Improved -TSH WNL, HIV and RPR nonreactive.  Vitamin B12 106 -CT head: no acute intracranial pathology.  -Patient did require several doses of Haldol and was placed in four-point restraints -continue to monitor closely and treat underlying conditions  Vitamin B 12 deficiency -Continue supplemental injections during hospitalization and discharge patient with oral supplementation  CLL, small B-cell type -Patient follows with Dr. Alvy Bimler and currently on chemotherapy Calquence -Chemo currently on hold -Continue acyclovir -Discussed with Dr. Alvy Bimler, patient is doing well with his treatment and is close to remission -WBC and platelets stable  COPD -On examination, no wheezing appreciated -Patient being treated for pneumonia with acute hypoxic respiratory failure -Continue Breo Ellipta and nebulizer treatments as needed  Peripheral neuropathy -Patient does follow with a neurologist as an outpatient -Continue Lyrica  Physical deconditioning and debility -PT recommended HH -OT - no further needs  ?Urinary retention -Patient feels that he has urinary frequency and is unable to urinate -Will order a bladder scan  -UA and culture are unremarkable for infection -Monitor intake and output -may start flomax  Hypokalemia -Will replace and continue to monitor BMP -obtain magnesium level  Chronic back pain -Will restart hydrocodone  DVT Prophylaxis Lovenox  Code Status: Full  Family Communication: None at bedside. Wife via phone.  Disposition Plan:  Status is: Inpatient  Remains inpatient appropriate because:Altered mental status, IV treatments appropriate  due to intensity of illness or inability to take PO and Inpatient level of care appropriate  due to severity of illness   Dispo: The patient is from: Home              Anticipated d/c is to: Home              Anticipated d/c date is: 3 days              Patient currently is not medically stable to d/c.   Consultants  Dr. Alvy Bimler, oncology-not officially consulted  Procedures  None  Antibiotics   Anti-infectives (From admission, onward)   Start     Dose/Rate Route Frequency Ordered Stop   11/29/19 0230  vancomycin (VANCOCIN) IVPB 1000 mg/200 mL premix        1,000 mg 200 mL/hr over 60 Minutes Intravenous Every 12 hours 11/28/19 1325     11/28/19 2200  acyclovir (ZOVIRAX) tablet 400 mg        400 mg Oral 2 times daily 11/28/19 1846     11/28/19 2130  piperacillin-tazobactam (ZOSYN) IVPB 3.375 g        3.375 g 12.5 mL/hr over 240 Minutes Intravenous Every 8 hours 11/28/19 1325     11/28/19 1300  vancomycin (VANCOREADY) IVPB 1500 mg/300 mL        1,500 mg 150 mL/hr over 120 Minutes Intravenous  Once 11/28/19 1249 11/28/19 1844   11/28/19 1245  piperacillin-tazobactam (ZOSYN) IVPB 3.375 g        3.375 g 100 mL/hr over 30 Minutes Intravenous  Once 11/28/19 1238 11/28/19 1407   11/28/19 1245  vancomycin (VANCOCIN) IVPB 1000 mg/200 mL premix  Status:  Discontinued        1,000 mg 200 mL/hr over 60 Minutes Intravenous  Once 11/28/19 1238 11/28/19 1249      Subjective:   Ryan Copeland seen and examined today.  Patient feeling better this morning.  Has no complaints of chest pain, abdominal pain, nausea or vomiting, diarrhea or constipation, dizziness or headache.    Objective:   Vitals:   11/30/19 0600 11/30/19 0604 11/30/19 0800 11/30/19 0953  BP: 104/86  125/68   Pulse: 77     Resp: (!) 22     Temp: 97.9 F (36.6 C)  98.6 F (37 C)   TempSrc: Oral  Oral   SpO2:    90%  Weight:  72.8 kg    Height:        Intake/Output Summary (Last 24 hours) at 11/30/2019 1102 Last data  filed at 11/30/2019 0900 Gross per 24 hour  Intake 2081.5 ml  Output 400 ml  Net 1681.5 ml   Filed Weights   11/28/19 2123 11/29/19 0050 11/30/19 0604  Weight: 73 kg 72.1 kg 72.8 kg   Exam  General: Well developed, chronically ill-appearing, NAD  HEENT: NCAT, PERRLA, EOMI, Anicteic Sclera, mucous membranes moist.   Neck: Supple, no JVD, no masses  Cardiovascular: S1 S2 auscultated, RRR, no murmur  Respiratory: Diminished breath sounds, no wheezing  Abdomen: Soft, nontender, nondistended, + bowel sounds  Extremities: warm dry without cyanosis clubbing or edema  Neuro: AAOx3, nonfocal  Psych: appropriate mood and affect, pleasant  Data Reviewed: I have personally reviewed following labs and imaging studies  CBC: Recent Labs  Lab 11/28/19 1131 11/28/19 1846 11/28/19 2304 11/29/19 0402 11/30/19 0443  WBC 3.7* 3.6* 3.0* 3.2* 3.0*  NEUTROABS 2.4  --   --   --   --   HGB 12.3* 11.8* 11.5* 11.5* 10.9*  HCT 38.6* 37.3* 35.4* 34.0* 33.5*  MCV 102.4*  102.8* 99.7 99.7 100.6*  PLT PLATELET CLUMPS NOTED ON SMEAR, COUNT APPEARS DECREASED 51* 53* 52* 47*   Basic Metabolic Panel: Recent Labs  Lab 11/28/19 1131 11/28/19 1846 11/29/19 0402 11/30/19 0443  NA 139  --  140 142  K 3.5  --  3.2* 2.8*  CL 105  --  106 105  CO2 26  --  24 26  GLUCOSE 76  --  92 107*  BUN 16  --  16 11  CREATININE 1.01 0.96 0.92 1.02  CALCIUM 8.3*  --  8.2* 8.2*  MG  --   --   --  1.7   GFR: Estimated Creatinine Clearance: 71.4 mL/min (by C-G formula based on SCr of 1.02 mg/dL). Liver Function Tests: Recent Labs  Lab 11/28/19 1131 11/29/19 0402 11/30/19 0443  AST 20 31 24   ALT 15 18 18   ALKPHOS 54 47 47  BILITOT 0.6 1.4* 1.5*  PROT 4.8* 4.4* 4.6*  ALBUMIN 2.8* 2.5* 2.5*   No results for input(s): LIPASE, AMYLASE in the last 168 hours. No results for input(s): AMMONIA in the last 168 hours. Coagulation Profile: Recent Labs  Lab 11/28/19 1131 11/29/19 0402  INR 1.2 1.4*    Cardiac Enzymes: No results for input(s): CKTOTAL, CKMB, CKMBINDEX, TROPONINI in the last 168 hours. BNP (last 3 results) No results for input(s): PROBNP in the last 8760 hours. HbA1C: Recent Labs    11/28/19 1922  HGBA1C 5.1   CBG: Recent Labs  Lab 11/29/19 0615 11/29/19 1114 11/29/19 1610 11/29/19 2251 11/30/19 0641  GLUCAP 87 104* 101* 101* 100*   Lipid Profile: No results for input(s): CHOL, HDL, LDLCALC, TRIG, CHOLHDL, LDLDIRECT in the last 72 hours. Thyroid Function Tests: Recent Labs    11/28/19 1922  TSH 1.020   Anemia Panel: Recent Labs    11/28/19 1846  VITAMINB12 106*   Urine analysis:    Component Value Date/Time   COLORURINE YELLOW 11/28/2019 [place] 11/28/2019 1127   LABSPEC 1.010 11/28/2019 1127   PHURINE 5.0 11/28/2019 1127   GLUCOSEU NEGATIVE 11/28/2019 1127   HGBUR NEGATIVE 11/28/2019 1127   [place] 11/28/2019 1127   KETONESUR NEGATIVE 11/28/2019 1127   PROTEINUR NEGATIVE 11/28/2019 1127   UROBILINOGEN 0.2 02/03/2014 1356   NITRITE NEGATIVE 11/28/2019 1127   LEUKOCYTESUR NEGATIVE 11/28/2019 1127   Sepsis Labs: @LABRCNTIP (procalcitonin:4,lacticidven:4)  ) Recent Results (from the past 240 hour(s))  Respiratory Panel by RT PCR (Flu A&B, Covid) -     Status: None   Collection Time: 11/28/19 11:27 AM   Specimen: Nasopharyngeal  Result Value Ref Range Status   SARS Coronavirus 2 by RT PCR NEGATIVE NEGATIVE Final    Comment: (NOTE) SARS-CoV-2 target nucleic acids are NOT DETECTED.  The SARS-CoV-2 RNA is generally detectable in upper respiratoy specimens during the acute phase of infection. The lowest concentration of SARS-CoV-2 viral copies this assay can detect is 131 copies/mL. A negative result does not preclude SARS-Cov-2 infection and should not be used as the sole basis for treatment or other patient management decisions. A negative result may occur with  improper specimen collection/handling,  submission of specimen other than nasopharyngeal swab, presence of viral mutation(s) within the areas targeted by this assay, and inadequate number of viral copies (<131 copies/mL). A negative result must be combined with clinical observations, patient history, and epidemiological information. The expected result is Negative.  Fact Sheet for Patients:  PinkCheek.be  Fact Sheet for Healthcare Providers:  GravelBags.it  This test  is no t yet approved or cleared by the [place] and  has been authorized for detection and/or diagnosis of SARS-CoV-2 by FDA under an Emergency Use Authorization (EUA). This EUA will remain  in effect (meaning this test can be used) for the duration of the COVID-19 declaration under Section 564(b)(1) of the Act, 21 U.S.C. section 360bbb-3(b)(1), unless the authorization is terminated or revoked sooner.     Influenza A by PCR NEGATIVE NEGATIVE Final   Influenza B by PCR NEGATIVE NEGATIVE Final    Comment: (NOTE) The Xpert Xpress SARS-CoV-2/FLU/RSV assay is intended as an aid in  the diagnosis of influenza from Nasopharyngeal swab specimens and  should not be used as a sole basis for treatment. Nasal washings and  aspirates are unacceptable for Xpert Xpress SARS-CoV-2/FLU/RSV  testing.  Fact Sheet for Patients: PinkCheek.be  Fact Sheet for Healthcare Providers: GravelBags.it  This test is not yet approved or cleared by the [place] FDA and  has been authorized for detection and/or diagnosis of SARS-CoV-2 by  FDA under an Emergency Use Authorization (EUA). This EUA will remain  in effect (meaning this test can be used) for the duration of the  Covid-19 declaration under Section 564(b)(1) of the Act, 21  U.S.C. section 360bbb-3(b)(1), unless the authorization is  terminated or revoked. Performed at [facility], [place] [address] [place], [place] [address]   Blood culture (routine single)     Status: None (Preliminary result)   Collection Time: 11/28/19 12:00 PM   Specimen: BLOOD RIGHT ARM  Result Value Ref Range Status   Specimen Description BLOOD RIGHT ARM  Final   Special Requests   Final    BOTTLES DRAWN AEROBIC AND ANAEROBIC Blood Culture adequate volume   Culture   Final    NO GROWTH 2 DAYS Performed at [facility], [place] [address] [place], [place] [address]    Report Status PENDING  Incomplete  Urine culture     Status: None   Collection Time: 11/28/19  7:16 PM   Specimen: In/Out Cath Urine  Result Value Ref Range Status   Specimen Description IN/OUT CATH URINE  Final   Special Requests NONE  Final   Culture   Final    NO GROWTH Performed at [facility], [place] [address], [place], [place] [address]    Report Status 11/30/2019 FINAL  Final  Culture, blood (single)     Status: None (Preliminary result)   Collection Time: 11/28/19  9:38 PM   Specimen: BLOOD  Result Value Ref Range Status   Specimen Description BLOOD LEFT ANTECUBITAL  Final   Special Requests   Final    BOTTLES DRAWN AEROBIC AND ANAEROBIC Blood Culture results may not be optimal due to an excessive volume of blood received in culture bottles   Culture   Final    NO GROWTH 2 DAYS Performed at [facility], [place] [address], [place], [place] [address]    Report Status PENDING  Incomplete      Radiology Studies: CT Head Wo Contrast  Result Date: 11/29/2019 CLINICAL DATA:  69 year old male with altered mental status. EXAM: CT HEAD WITHOUT CONTRAST TECHNIQUE: Contiguous axial images were obtained from the base of the skull through the vertex without intravenous contrast. COMPARISON:  None. FINDINGS: Brain: The ventricles and sulci appropriate size for patient's age. Minimal periventricular and deep white matter chronic microvascular ischemic changes noted. There is no acute intracranial hemorrhage. No mass  effect midline shift. No extra-axial fluid collection. Vascular: No hyperdense vessel or unexpected calcification. Skull: Normal. Negative for fracture or focal lesion. Sinuses/Orbits: There is diffuse mucoperiosteal thickening of paranasal sinuses with near complete opacification of the maxillary sinuses and ethmoid air cells. No air-fluid level. The mastoid air cells are clear. Other: None IMPRESSION: 1. No acute intracranial pathology. 2. Paranasal sinus disease. Electronically Signed   By: Anner Crete M.D.   On: 11/29/2019 19:41   CT Angio Chest PE W and/or Wo Contrast  Result Date: 11/29/2019 CLINICAL DATA:  Shortness of breath. Clinical concern for pulmonary embolism. EXAM: CT ANGIOGRAPHY CHEST WITH CONTRAST TECHNIQUE: Multidetector CT imaging of the chest was performed using the standard protocol during bolus administration of intravenous contrast. Multiplanar CT image reconstructions and MIPs were obtained to evaluate the vascular anatomy. CONTRAST:  67mL OMNIPAQUE IOHEXOL 350 MG/ML SOLN COMPARISON:  11/29/2018. FINDINGS: Cardiovascular: There is satisfactory opacification of the pulmonary arteries to the segmental level. There is no evidence of a pulmonary embolism. Heart is top-normal in size. There are three-vessel coronary artery calcifications. No pericardial effusion. Great vessels are normal caliber. There is aortic atherosclerosis no significant stenosis. No dissection Mediastinum/Nodes: No neck base, axillary, mediastinal or hilar masses or enlarged lymph nodes. Trachea and esophagus are unremarkable. Lungs/Pleura: Multiple areas of lung consolidation. Many of these are nodular in configuration. There are ill-defined masslike areas of opacity in the left upper lobe, lateral to the left hilum, several of which shows central air density consistent cavitation. There is also dependent ground-glass type opacity which may reflect atelectasis infection or a combination. These findings are all new  since the prior chest CT. The largest masslike opacity in the left upper lobe measures 3 cm in greatest transverse dimension and is centered on image 74, series 6. Larger stool nodular opacity on the right is in the lower lobe, image 91, series 6, 1.2 cm in size. Small left and minimal right pleural effusions.  No pneumothorax. Upper Abdomen: No acute findings. Partly imaged spleen is enlarged, 12.5 cm in greatest transverse dimension, unchanged from the prior CT. Musculoskeletal: No fracture or acute finding. No osteoblastic or osteolytic lesions. Review of the MIP images confirms the above findings. IMPRESSION: 1. No evidence of a pulmonary embolism. 2. Bilateral areas of lung consolidation consistent with multifocal pneumonia. Many of these areas are nodular to mass-like in configuration, but still suspected to be infectious in etiology. Recommend follow-up chest CT after completion of therapy and symptom improvement to document improvement/resolution on imaging. There are associated small left and minimal right pleural effusions. Aortic Atherosclerosis (ICD10-I70.0). Electronically Signed   By: Lajean Manes M.D.   On: 11/29/2019 19:46   DG Chest Port 1 View  Result Date: 11/28/2019 CLINICAL DATA:  Questionable sepsis. Patient lethargic and ill for 4 weeks. EXAM: PORTABLE CHEST 1 VIEW COMPARISON:  None. FINDINGS: Extensive peripheral predominant airspace opacities in the left midlung and lung base. No visible pleural effusions or pneumothorax. Mild enlargement the cardiac silhouette. Coronary artery stent. Aortic atherosclerosis. Right approach Port-A-Cath with the tip projecting at the inferior SVC. No acute osseous abnormality. IMPRESSION: 1. Extensive peripheral predominant airspace opacities in the left midlung and lung base, compatible with pneumonia. While nonspecific, consider COVID. 2. Mild cardiomegaly. Electronically Signed   By: Margaretha Sheffield MD   On: 11/28/2019 12:27     Scheduled  Meds: . acyclovir  400 mg Oral BID  . allopurinol  300 mg Oral Daily  . Chlorhexidine Gluconate Cloth  6 each Topical Daily  . enoxaparin (LOVENOX) injection  40 mg Subcutaneous Q24H  . fluticasone  2 spray Each Nare Daily  . fluticasone furoate-vilanterol  1 puff Inhalation Daily  . insulin aspart  0-5 Units Subcutaneous QHS  . insulin aspart  0-6 Units Subcutaneous TID WC  . montelukast  10 mg Oral Daily  . potassium chloride  40 mEq Oral BID  . pregabalin  200 mg Oral TID  . rosuvastatin  5 mg Oral Daily  . sodium chloride flush  10-40 mL Intracatheter Q12H   Continuous Infusions: . sodium chloride 1,000 mL (11/30/19 0838)  . piperacillin-tazobactam (ZOSYN)  IV 3.375 g (11/30/19 9643)  . potassium chloride 10 mEq (11/30/19 1033)  . vancomycin 1,000 mg (11/30/19 0245)     LOS: 2 days   Time Spent in minutes   45 minutes  Zuleica Seith D.O. on 11/30/2019 at 11:02 AM  Between 7am to 7pm - Please see pager noted on [email or web address]  After 7pm go to [email or web address]  And look for the night coverage person covering for me after hours  [facility] Office  [phone]

## 2019-11-30 NOTE — Evaluation (Signed)
Occupational Therapy Evaluation Patient Details Name: Ryan Copeland MRN: [id number] DOB: 29-Sep-1950 Today's Date: 11/30/2019    History of Present Illness Ryan Copeland is a 69 y.o. 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  42 of HPI obtained from patient spouse, ED physician, EMS reports due to patient's altered mental status and inability to participate in HPI.  Apparently, patient has been slowly declining since his recent second Covid-19 vaccination on September 2021.  He was seen by his PCP recently on 11/20/2019 and given treatment with azithromycin, Levaquin and prednisone.  He continues to slowly decline with associated mental status changes, weakness.  No other recent changes in his medications or sick contacts.   Clinical Impression   Patient admitted for the above diagnosis.  He presents with continued lethargy, generalized weakness, impaired balance and decreased cognition from baseline.  At home he states he needs no assistance with self care and mobility.  States he still drives, and does all necessary yard work.  Currently he is needing up to [facility] for basic transfers and ADL in sit.  OT to continue to follow in the acute setting.  Patient does not believe he will need [place] services.      Follow Up Recommendations  No OT follow up    Equipment Recommendations  None recommended by OT    Recommendations for Other Services       Precautions / Restrictions Precautions Precautions: Fall Restrictions Weight Bearing Restrictions: No      Mobility Bed Mobility Overal bed mobility: Needs Assistance Bed Mobility: Supine to Sit;Sit to Supine;Rolling Rolling: Independent   Supine to sit: Min guard;HOB elevated Sit to supine: Supervision;HOB elevated      Transfers Overall transfer level: Needs  assistance   Transfers: Squat Pivot Transfers     Squat pivot transfers: Min assist          Balance Overall balance assessment: Needs assistance Sitting-balance support: No upper extremity supported;Feet supported Sitting balance-Leahy Scale: Fair       Standing balance-Leahy Scale: Poor Standing balance comment: HHA for basic transfer to [facility].  Practiced stand pivot.                           ADL either performed or assessed with clinical judgement   ADL Overall ADL's : Needs assistance/impaired     Grooming: Wash/dry hands;Wash/dry face;Set up;Sitting Grooming Details (indicate cue type and reason): EOB         Upper Body Dressing : Minimal assistance;Sitting Upper Body Dressing Details (indicate cue type and reason): Able to place gown like a robe in sitting.  Min Assist due to IV Lower Body Dressing: Supervision/safety;Sitting/lateral leans Lower Body Dressing Details (indicate cue type and reason): able to change socks Toilet Transfer: BSC;Minimal assistance                   Vision Baseline Vision/History: No visual deficits Patient Visual Report: No change from baseline       Perception     Praxis      Pertinent Vitals/Pain Pain Assessment: Faces Faces Pain Scale: Hurts little more Pain Location: low back - chronic Pain Descriptors / Indicators: Dull;Aching;Discomfort Pain Intervention(s): Monitored during session;Repositioned     Hand Dominance Right   Extremity/Trunk Assessment Upper Extremity Assessment Upper Extremity Assessment:  Overall [facility] for tasks assessed   Lower Extremity Assessment Lower Extremity Assessment: Defer to PT evaluation       Communication Communication Communication: No difficulties   Cognition Arousal/Alertness: Lethargic Behavior During Therapy: WFL for tasks assessed/performed Overall Cognitive Status: Within Functional Limits for tasks assessed                                  General Comments: Patient presents with improved mentation.  Knows where he is at, understands his diagnosis.  Continues with decreased complex thought.  Loses his train of thought.   General Comments       Exercises     Shoulder Instructions      Home Living Family/patient expects to be discharged to:: Private residence Living Arrangements: Spouse/significant other Available Help at Discharge: Family;Available 24 hours/day Type of Home: House Home Access: Stairs to enter [employer] of Steps: 3-4 Entrance Stairs-Rails: Right Home Layout: Two level;Laundry or work area in basement     [employer] Shower/Tub: Teacher, early years/pre: [employer]: None          Prior Functioning/Environment Level of Independence: Independent        Comments: Retired last year        OT Problem List: Decreased activity tolerance;Impaired balance (sitting and/or standing);Decreased safety awareness      OT Treatment/Interventions: Self-care/ADL training;Therapeutic exercise;Balance training;Therapeutic activities    OT Goals(Current goals can be found in the care plan section) Acute Rehab OT Goals Patient Stated Goal: Hoping to get better and return home OT Goal Formulation: With patient Time For Goal Achievement: 12/14/19 Potential to Achieve Goals: Good ADL Goals Pt Will Perform Grooming: Independently;standing Pt Will Perform Lower Body Bathing: Independently;sit to/from stand Pt Will Perform Lower Body Dressing: Independently;sit to/from stand Pt Will Transfer to Toilet: with modified independence  OT Frequency: Min 2X/week   Barriers to D/C:    None noted       Co-evaluation              AM-PAC OT "6 Clicks" Daily Activity     Outcome Measure Help from another person eating meals?: None Help from another person taking care of personal grooming?: None Help from another person toileting, which includes using toliet, bedpan, or  urinal?: A Little Help from another person bathing (including washing, rinsing, drying)?: A Little Help from another person to put on and taking off regular upper body clothing?: A Little Help from another person to put on and taking off regular lower body clothing?: A Little 6 Click Score: 20   End of Session Nurse Communication: Other (comment) (cleared eval)  Activity Tolerance: Patient limited by lethargy Patient left: in bed;with call bell/phone within reach  OT Visit Diagnosis: Unsteadiness on feet (R26.81);Muscle weakness (generalized) (M62.81);Pain Pain - part of body:  (back)                Time: 1638-4665 OT Time Calculation (min): 21 min Charges:  OT General Charges $OT Visit: 1 Visit OT Evaluation $OT Eval Moderate Complexity: 1 Mod  11/30/2019  Rich, OTR/L  Acute Rehabilitation Services  Office:  [place] 11/30/2019, 10:08 AM

## 2019-11-30 NOTE — Progress Notes (Signed)
11/30/19 1300  Urine Characteristics  Intermittent/Straight Cath (mL) 1700 mL (stopped in between. second RN present.)  Hygiene Peri care

## 2019-11-30 NOTE — Progress Notes (Signed)
2000 pt BP is low 83/48. Pt denies lightheadedness. However noted to be Drowsy but arousble. Paged Dr Marlowe Sax, 500 CC NS bolus given. Will monitor

## 2019-12-01 ENCOUNTER — Inpatient Hospital Stay (HOSPITAL_COMMUNITY): Payer: Medicare Other

## 2019-12-01 LAB — BASIC METABOLIC PANEL
Anion gap: 8 (ref 5–15)
BUN: 11 mg/dL (ref 8–23)
CO2: 24 mmol/L (ref 22–32)
Calcium: 7.5 mg/dL — ABNORMAL LOW (ref 8.9–10.3)
Chloride: 109 mmol/L (ref 98–111)
Creatinine, Ser: 0.94 mg/dL (ref 0.61–1.24)
GFR, Estimated: >60 mL/min (ref >60)
Glucose, Bld: 97 mg/dL (ref 70–99)
Potassium: 3.9 mmol/L (ref 3.5–5.1)
Sodium: 141 mmol/L (ref 135–145)

## 2019-12-01 LAB — LEGIONELLA PNEUMOPHILA SEROGP 1 UR AG: L. pneumophila Serogp 1 Ur Ag: NEGATIVE

## 2019-12-01 LAB — CBC
HCT: 26.7 % — ABNORMAL LOW (ref 39.0–52.0)
Hemoglobin: 8.8 g/dL — ABNORMAL LOW (ref 13.0–17.0)
MCH: 33.8 pg (ref 26.0–34.0)
MCHC: 33 g/dL (ref 30.0–36.0)
MCV: 102.7 fL — ABNORMAL HIGH (ref 80.0–100.0)
Platelets: 39 10*3/uL — ABNORMAL LOW (ref 150–400)
RBC: 2.6 MIL/uL — ABNORMAL LOW (ref 4.22–5.81)
RDW: 16.7 % — ABNORMAL HIGH (ref 11.5–15.5)
WBC: 2 10*3/uL — ABNORMAL LOW (ref 4.0–10.5)
nRBC: 0 % (ref 0.0–0.2)

## 2019-12-01 LAB — GLUCOSE, CAPILLARY
Glucose-Capillary: 96 mg/dL (ref 70–99)
Glucose-Capillary: 119 mg/dL — ABNORMAL HIGH (ref 70–99)

## 2019-12-01 LAB — FOLATE: Folate: 23.2 ng/mL (ref >5.9)

## 2019-12-01 LAB — RETICULOCYTES
Immature Retic Fract: 4.7 % (ref 2.3–15.9)
RBC.: 2.75 MIL/uL — ABNORMAL LOW (ref 4.22–5.81)
Retic Count, Absolute: 25.6 10*3/uL (ref 19.0–186.0)
Retic Ct Pct: 0.9 % (ref 0.4–3.1)

## 2019-12-01 LAB — VITAMIN B12: Vitamin B-12: 1605 pg/mL — ABNORMAL HIGH (ref 180–914)

## 2019-12-01 LAB — MAGNESIUM: Magnesium: 1.9 mg/dL (ref 1.7–2.4)

## 2019-12-01 LAB — IRON AND TIBC
Iron: 20 ug/dL — ABNORMAL LOW (ref 45–182)
Saturation Ratios: 13 % — ABNORMAL LOW (ref 17.9–39.5)
TIBC: 150 ug/dL — ABNORMAL LOW (ref 250–450)
UIBC: 130 ug/dL

## 2019-12-01 LAB — FERRITIN: Ferritin: 249 ng/mL (ref 24–336)

## 2019-12-01 MED ORDER — NICOTINE 21 MG/24HR TD PT24
21.0000 mg | MEDICATED_PATCH | Freq: Every day | TRANSDERMAL | Status: DC
  Filled 2019-12-01: qty 1

## 2019-12-01 MED ORDER — ALPRAZOLAM 0.25 MG PO TABS
0.2500 mg | ORAL_TABLET | Freq: Two times a day (BID) | ORAL | Status: DC | PRN
  Administered 2019-12-01 – 2019-12-05 (×8): 0.25 mg via ORAL
  Filled 2019-12-01 (×8): qty 1

## 2019-12-01 MED ORDER — B COMPLEX PO TABS: 1.0000 | ORAL_TABLET | Freq: Every day | ORAL | Status: DC

## 2019-12-01 MED ORDER — ALBUTEROL SULFATE (2.5 MG/3ML) 0.083% IN NEBU: 2.5000 mg | INHALATION_SOLUTION | RESPIRATORY_TRACT | Status: DC | PRN

## 2019-12-01 MED ORDER — VITAMIN D 25 MCG (1000 UNIT) PO TABS
1000.0000 [IU] | ORAL_TABLET | Freq: Every day | ORAL | Status: DC
  Administered 2019-12-02 – 2019-12-05 (×4): 1000 [IU] via ORAL
  Filled 2019-12-01 (×5): qty 1

## 2019-12-01 MED ORDER — LIDOCAINE HCL URETHRAL/MUCOSAL 2 % EX GEL
1.0000 "application " | CUTANEOUS | Status: DC | PRN
  Administered 2019-12-01 – 2019-12-02 (×2): 1 via URETHRAL
  Filled 2019-12-01 (×3): qty 11

## 2019-12-01 MED ORDER — ALBUTEROL SULFATE HFA 108 (90 BASE) MCG/ACT IN AERS
2.0000 | INHALATION_SPRAY | Freq: Four times a day (QID) | RESPIRATORY_TRACT | Status: DC | PRN
  Filled 2019-12-01: qty 6.7

## 2019-12-01 MED ORDER — BENZONATATE 100 MG PO CAPS: 200.0000 mg | ORAL_CAPSULE | Freq: Three times a day (TID) | ORAL | Status: DC | PRN

## 2019-12-01 MED ORDER — B COMPLEX-C PO TABS
1.0000 | ORAL_TABLET | Freq: Every day | ORAL | Status: DC
  Administered 2019-12-02 – 2019-12-05 (×4): 1 via ORAL
  Filled 2019-12-01 (×4): qty 1

## 2019-12-01 NOTE — Progress Notes (Signed)
PROGRESS NOTE    Ryan Copeland  KPT:[id number] DOB: 1950-06-27 DOA: 11/28/2019 PCP: [name]., PA-C   Brief Narrative:  HPI on 11/28/2019 by Dr. Eric [place] Ryan Copeland is a 69 y.o. 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  18 of HPI obtained from patient spouse, ED physician, EMS reports due to patient's altered mental status and inability to participate in HPI.  Apparently, patient has been slowly declining since his recent second Covid-19 vaccination on September 2021.  He was seen by his PCP recently on 11/20/2019 and given treatment with azithromycin, Levaquin and prednisone.  He continues to slowly decline with associated mental status changes, weakness.  No other recent changes in his medications or sick contacts.  Interim history  Admitted with sepsis secondary to pneumonia and AMS. Patient now with urinary retention.  Assessment & Plan   Severe sepsis with acute hypoxic respiratory failure secondary to community-acquired pneumonia -Sepsis present on admission as patient was noted to be febrile with tachycardia.  Chest x-ray on admission showed extensive peripheral opacity in the left midlung/lung base consistent with community-acquired pneumonia.  Patient was also noted to have hypoxia with oxygen saturations of 82% on room air. -patient is also immunocompromised -Patient placed on supplemental oxygen along with IV fluids, vancomycin and Zosyn -Has been weaned to room air -Of note, patient failed outpatient treatment with azithromycin and Levaquin -COVID-19 PCR as well as rapid influenza negative -Strep pneumonia urine antigen negative, pending Legionella urine antigen -Blood cultures show no growth to date -UA unremarkable for infection; Urine culture showed no growth -Procalcitonin 2.21 -CTA  chest negative for PE. Bilateral areas of lung consolidation consistent with multifocal pneumonia, areas are nodular to mass-like suspected to be infectious in etiology. Repeat CT recommended after therapy and symptom improvement.  -MRSA swab negative, discontinued vancomycin  Acute metabolic encephalopathy -Secondary to the above -Improved -TSH WNL, HIV and RPR nonreactive.  Vitamin B12 106 -CT head: no acute intracranial pathology.  -Patient did require several doses of Haldol and was placed in four-point restraints -continue to monitor closely and treat underlying conditions  Vitamin B 12 deficiency -Continue supplemental injections during hospitalization and discharge patient with oral supplementation  CLL, small B-cell type -Patient follows with Dr. Alvy Bimler and currently on chemotherapy Calqu[place] -Chemo currently on hold -Continue acyclovir -Discussed with Dr. Alvy Bimler, patient is doing well with his treatment and is close to remission -WBC and platelets stable  COPD -On examination, no wheezing appreciated -Patient being treated for pneumonia with acute hypoxic respiratory failure -Continue Breo Ellipta and nebulizer treatments as needed  Peripheral neuropathy -Patient does follow with a neurologist as an outpatient -Continue Lyrica  Physical deconditioning and debility -PT recommended HH -OT - no further needs  Acute Urinary retention -Patient feels that he has urinary frequency and is unable to urinate  -UA and culture are unremarkable for infection -Patient with multiple bladder scan showing retention -We will continue to BladderScan every 4 hours, if needed conduct in/out -Lidocaine gel has been ordered -Started Flomax -Monitor intake and output -may need urology follow up -will obtain renal US  Hypokalemia -Replaced, continue to monitor and replace as needed -Magnesium 1.9  Chronic back pain -Continue hydrocodone  Hypotension -Patient currently  asymptomatic and tells me he has low blood pressure all the time -Will continue to monitor  Anemia -  hemoglobin down to 8.8 today -Will obtain anemia panel and FOBT -monitor CBC  Tobacco abuse -will order nicotine patch  DVT Prophylaxis Lovenox  Code Status: Full  Family Communication: None at bedside. Wife via phone.  Disposition Plan:  Status is: Inpatient  Remains inpatient appropriate because:Altered mental status, IV treatments appropriate due to intensity of illness or inability to take PO and Inpatient level of care appropriate due to severity of illness   Dispo: The patient is from: Home              Anticipated d/c is to: Home              Anticipated d/c date is: 3 days              Patient currently is not medically stable to d/c.   Consultants  Dr. Alvy Bimler, oncology-not officially consulted  Procedures  None  Antibiotics   Anti-infectives (From admission, onward)   Start     Dose/Rate Route Frequency Ordered Stop   11/29/19 0230  vancomycin (VANCOCIN) IVPB 1000 mg/200 mL premix  Status:  Discontinued        1,000 mg 200 mL/hr over 60 Minutes Intravenous Every 12 hours 11/28/19 1325 12/01/19 0810   11/28/19 2200  acyclovir (ZOVIRAX) tablet 400 mg        400 mg Oral 2 times daily 11/28/19 1846     11/28/19 2130  piperacillin-tazobactam (ZOSYN) IVPB 3.375 g        3.375 g 12.5 mL/hr over 240 Minutes Intravenous Every 8 hours 11/28/19 1325     11/28/19 1300  vancomycin (VANCOREADY) IVPB 1500 mg/300 mL        1,500 mg 150 mL/hr over 120 Minutes Intravenous  Once 11/28/19 1249 11/28/19 1844   11/28/19 1245  piperacillin-tazobactam (ZOSYN) IVPB 3.375 g        3.375 g 100 mL/hr over 30 Minutes Intravenous  Once 11/28/19 1238 11/28/19 1407   11/28/19 1245  vancomycin (VANCOCIN) IVPB 1000 mg/200 mL premix  Status:  Discontinued        1,000 mg 200 mL/hr over 60 Minutes Intravenous  Once 11/28/19 1238 11/28/19 1249      Subjective:   Ryan Copeland seen and  examined today.  Patient upset this morning as he was woken up for vital signs at approximately 2 AM.  Continues to have problems with urination today.  Does not feel discomfort.  Denies current chest pain or shortness of breath, abdominal pain, nausea or vomiting, dizziness or headache.  Would like to have a regular diet.    Objective:   Vitals:   12/01/19 0522 12/01/19 0844 12/01/19 0848 12/01/19 1200  BP: (!) 90/53 (!) 88/52  (!) 85/52  Pulse: 77   66  Resp: 20 18  18   Temp: 98.3 F (36.8 C) 98.9 F (37.2 C)  97.7 F (36.5 C)  TempSrc: Oral Oral  Oral  SpO2: 90% 93% 92% 94%  Weight: 74 kg     Height:        Intake/Output Summary (Last 24 hours) at 12/01/2019 1211 Last data filed at 12/01/2019 0000 Gross per 24 hour  Intake 930 ml  Output 2500 ml  Net -1570 ml   Filed Weights   11/29/19 0050 11/30/19 0604 12/01/19 0522  Weight: 72.1 kg 72.8 kg 74 kg   Exam  General: Well developed, chronically ill appearing, thin NAD  HEENT: NCAT, mucous membranes moist.   Cardiovascular: S1 S2 auscultated, RRR  Respiratory: Diminished  breath sounds, no wheezing  Abdomen: Soft, nontender, nondistended, + bowel sounds  Extremities: warm dry without cyanosis clubbing or edema  Neuro: AAOx3, nonfocal  Psych: Pleasant, appropriate mood and affect  Data Reviewed: I have personally reviewed following labs and imaging studies  CBC: Recent Labs  Lab 11/28/19 1131 11/28/19 1131 11/28/19 1846 11/28/19 2304 11/29/19 0402 11/30/19 0443 12/01/19 0456  WBC 3.7*   < > 3.6* 3.0* 3.2* 3.0* 2.0*  NEUTROABS 2.4  --   --   --   --   --   --   HGB 12.3*   < > 11.8* 11.5* 11.5* 10.9* 8.8*  HCT 38.6*   < > 37.3* 35.4* 34.0* 33.5* 26.7*  MCV 102.4*   < > 102.8* 99.7 99.7 100.6* 102.7*  PLT PLATELET CLUMPS NOTED ON SMEAR, COUNT APPEARS DECREASED   < > 51* 53* 52* 47* 39*   < > = values in this interval not displayed.   Basic Metabolic Panel: Recent Labs  Lab 11/28/19 1131  11/28/19 1846 11/29/19 0402 11/30/19 0443 12/01/19 0456  NA 139  --  140 142 141  K 3.5  --  3.2* 2.8* 3.9  CL 105  --  106 105 109  CO2 26  --  24 26 24   GLUCOSE 76  --  92 107* 97  BUN 16  --  16 11 11   CREATININE 1.01 0.96 0.92 1.02 0.94  CALCIUM 8.3*  --  8.2* 8.2* 7.5*  MG  --   --   --  1.7 1.9   GFR: Estimated Creatinine Clearance: 78.7 mL/min (by C-G formula based on SCr of 0.94 mg/dL). Liver Function Tests: Recent Labs  Lab 11/28/19 1131 11/29/19 0402 11/30/19 0443  AST 20 31 24   ALT 15 18 18   ALKPHOS 54 47 47  BILITOT 0.6 1.4* 1.5*  PROT 4.8* 4.4* 4.6*  ALBUMIN 2.8* 2.5* 2.5*   No results for input(s): LIPASE, AMYLASE in the last 168 hours. No results for input(s): AMMONIA in the last 168 hours. Coagulation Profile: Recent Labs  Lab 11/28/19 1131 11/29/19 0402  INR 1.2 1.4*   Cardiac Enzymes: No results for input(s): CKTOTAL, CKMB, CKMBINDEX, TROPONINI in the last 168 hours. BNP (last 3 results) No results for input(s): PROBNP in the last 8760 hours. HbA1C: Recent Labs    11/28/19 1922  HGBA1C 5.1   CBG: Recent Labs  Lab 11/30/19 1201 11/30/19 1636 11/30/19 2125 12/01/19 0604 12/01/19 1205  GLUCAP 138* 120* 90 96 119*   Lipid Profile: No results for input(s): CHOL, HDL, LDLCALC, TRIG, CHOLHDL, LDLDIRECT in the last 72 hours. Thyroid Function Tests: Recent Labs    11/28/19 1922  TSH 1.020   Anemia Panel: Recent Labs    11/28/19 1846  VITAMINB12 106*   Urine analysis:    Component Value Date/Time   COLORURINE YELLOW 11/28/2019 [place] 11/28/2019 1127   LABSPEC 1.010 11/28/2019 1127   PHURINE 5.0 11/28/2019 1127   GLUCOSEU NEGATIVE 11/28/2019 1127   HGBUR NEGATIVE 11/28/2019 1127   [place] 11/28/2019 1127   KETONESUR NEGATIVE 11/28/2019 1127   PROTEINUR NEGATIVE 11/28/2019 1127   UROBILINOGEN 0.2 02/03/2014 1356   NITRITE NEGATIVE 11/28/2019 1127   LEUKOCYTESUR NEGATIVE 11/28/2019 1127    Sepsis Labs: @LABRCNTIP (procalcitonin:4,lacticidven:4)  ) Recent Results (from the past 240 hour(s))  Respiratory Panel by RT PCR (Flu A&B, Covid) -     Status: None   Collection Time: 11/28/19 11:27 AM   Specimen: Nasopharyngeal  Result Value Ref Range Status   SARS Coronavirus 2 by RT PCR NEGATIVE NEGATIVE Final    Comment: (NOTE) SARS-CoV-2 target nucleic acids are NOT DETECTED.  The SARS-CoV-2 RNA is generally detectable in upper respiratoy specimens during the acute phase of infection. The lowest concentration of SARS-CoV-2 viral copies this assay can detect is 131 copies/mL. A negative result does not preclude SARS-Cov-2 infection and should not be used as the sole basis for treatment or other patient management decisions. A negative result may occur with  improper specimen collection/handling, submission of specimen other than nasopharyngeal swab, presence of viral mutation(s) within the areas targeted by this assay, and inadequate number of viral copies (<131 copies/mL). A negative result must be combined with clinical observations, patient history, and epidemiological information. The expected result is Negative.  Fact Sheet for Patients:  PinkCheek.be  Fact Sheet for Healthcare Providers:  GravelBags.it  This test is no t yet approved or cleared by the [place] FDA and  has been authorized for detection and/or diagnosis of SARS-CoV-2 by FDA under an Emergency Use Authorization (EUA). This EUA will remain  in effect (meaning this test can be used) for the duration of the COVID-19 declaration under Section 564(b)(1) of the Act, 21 U.S.C. section 360bbb-3(b)(1), unless the authorization is terminated or revoked sooner.     Influenza A by PCR NEGATIVE NEGATIVE Final   Influenza B by PCR NEGATIVE NEGATIVE Final    Comment: (NOTE) The Xpert Xpress SARS-CoV-2/FLU/RSV assay is intended as an aid in  the  diagnosis of influenza from Nasopharyngeal swab specimens and  should not be used as a sole basis for treatment. Nasal washings and  aspirates are unacceptable for Xpert Xpress SARS-CoV-2/FLU/RSV  testing.  Fact Sheet for Patients: PinkCheek.be  Fact Sheet for Healthcare Providers: GravelBags.it  This test is not yet approved or cleared by the [place] FDA and  has been authorized for detection and/or diagnosis of SARS-CoV-2 by  FDA under an Emergency Use Authorization (EUA). This EUA will remain  in effect (meaning this test can be used) for the duration of the  Covid-19 declaration under Section 564(b)(1) of the Act, 21  U.S.C. section 360bbb-3(b)(1), unless the authorization is  terminated or revoked. Performed at [facility], [place] [address], [place], [place] [address]   Blood culture (routine single)     Status: None (Preliminary result)   Collection Time: 11/28/19 12:00 PM   Specimen: BLOOD RIGHT ARM  Result Value Ref Range Status   Specimen Description BLOOD RIGHT ARM  Final   Special Requests   Final    BOTTLES DRAWN AEROBIC AND ANAEROBIC Blood Culture adequate volume   Culture   Final    NO GROWTH 3 DAYS Performed at [facility], [place] [address] [place], [place] [address]    Report Status PENDING  Incomplete  Urine culture     Status: None   Collection Time: 11/28/19  7:16 PM   Specimen: In/Out Cath Urine  Result Value Ref Range Status   Specimen Description IN/OUT CATH URINE  Final   Special Requests NONE  Final   Culture   Final    NO GROWTH Performed at [facility], [place] [address], [place], [place] [address]    Report Status 11/30/2019 FINAL  Final  Culture, blood (single)     Status: None (Preliminary result)   Collection Time: 11/28/19  9:38 PM   Specimen: BLOOD  Result Value Ref Range Status  Specimen Description BLOOD LEFT ANTECUBITAL  Final   Special Requests   Final     BOTTLES DRAWN AEROBIC AND ANAEROBIC Blood Culture results may not be optimal due to an excessive volume of blood received in culture bottles   Culture   Final    NO GROWTH 3 DAYS Performed at [facility], [place] [address] [place], [place] [address]    Report Status PENDING  Incomplete  MRSA PCR Screening     Status: None   Collection Time: 11/30/19  8:38 AM   Specimen: Nasopharyngeal  Result Value Ref Range Status   MRSA by PCR NEGATIVE NEGATIVE Final    Comment:        The GeneXpert MRSA Assay (FDA approved for NASAL specimens only), is one component of a comprehensive MRSA colonization surveillance program. It is not intended to diagnose MRSA infection nor to guide or monitor treatment for MRSA infections. Performed at [facility], [place] [address] [place], [place] [address]       Radiology Studies: CT Head Wo Contrast  Result Date: 11/29/2019 CLINICAL DATA:  70 year old male with altered mental status. EXAM: CT HEAD WITHOUT CONTRAST TECHNIQUE: Contiguous axial images were obtained from the base of the skull through the vertex without intravenous contrast. COMPARISON:  None. FINDINGS: Brain: The ventricles and sulci appropriate size for patient's age. Minimal periventricular and deep white matter chronic microvascular ischemic changes noted. There is no acute intracranial hemorrhage. No mass effect midline shift. No extra-axial fluid collection. Vascular: No hyperdense vessel or unexpected calcification. Skull: Normal. Negative for fracture or focal lesion. Sinuses/Orbits: There is diffuse mucoperiosteal thickening of paranasal sinuses with near complete opacification of the maxillary sinuses and ethmoid air cells. No air-fluid level. The mastoid air cells are clear. Other: None IMPRESSION: 1. No acute intracranial pathology. 2. Paranasal sinus disease. Electronically Signed   By: Anner Crete M.D.   On: 11/29/2019 19:41   CT Angio Chest PE W and/or Wo  Contrast  Result Date: 11/29/2019 CLINICAL DATA:  Shortness of breath. Clinical concern for pulmonary embolism. EXAM: CT ANGIOGRAPHY CHEST WITH CONTRAST TECHNIQUE: Multidetector CT imaging of the chest was performed using the standard protocol during bolus administration of intravenous contrast. Multiplanar CT image reconstructions and MIPs were obtained to evaluate the vascular anatomy. CONTRAST:  25mL OMNIPAQUE IOHEXOL 350 MG/ML SOLN COMPARISON:  11/29/2018. FINDINGS: Cardiovascular: There is satisfactory opacification of the pulmonary arteries to the segmental level. There is no evidence of a pulmonary embolism. Heart is top-normal in size. There are three-vessel coronary artery calcifications. No pericardial effusion. Great vessels are normal caliber. There is aortic atherosclerosis no significant stenosis. No dissection Mediastinum/Nodes: No neck base, axillary, mediastinal or hilar masses or enlarged lymph nodes. Trachea and esophagus are unremarkable. Lungs/Pleura: Multiple areas of lung consolidation. Many of these are nodular in configuration. There are ill-defined masslike areas of opacity in the left upper lobe, lateral to the left hilum, several of which shows central air density consistent cavitation. There is also dependent ground-glass type opacity which may reflect atelectasis infection or a combination. These findings are all new since the prior chest CT. The largest masslike opacity in the left upper lobe measures 3 cm in greatest transverse dimension and is centered on image 74, series 6. Larger stool nodular opacity on the right is in the lower lobe, image 91, series 6, 1.2 cm in size. Small left and minimal right pleural effusions.  No pneumothorax. Upper Abdomen: No acute findings. Partly imaged spleen  is enlarged, 12.5 cm in greatest transverse dimension, unchanged from the prior CT. Musculoskeletal: No fracture or acute finding. No osteoblastic or osteolytic lesions. Review of the MIP  images confirms the above findings. IMPRESSION: 1. No evidence of a pulmonary embolism. 2. Bilateral areas of lung consolidation consistent with multifocal pneumonia. Many of these areas are nodular to mass-like in configuration, but still suspected to be infectious in etiology. Recommend follow-up chest CT after completion of therapy and symptom improvement to document improvement/resolution on imaging. There are associated small left and minimal right pleural effusions. Aortic Atherosclerosis (ICD10-I70.0). Electronically Signed   By: Lajean Manes M.D.   On: 11/29/2019 19:46   US RENAL  Result Date: 12/01/2019 CLINICAL DATA:  Urinary retention EXAM: RENAL / URINARY TRACT ULTRASOUND COMPLETE COMPARISON:  CT abdomen/pelvis dated 11/29/2018 FINDINGS: Right Kidney: Renal measurements: 10.4 x 5.3 x 4.9 cm = volume: 142.5 mL. Echogenicity within normal limits. No mass or hydronephrosis visualized. Left Kidney: Renal measurements: 11.9 x 4.7 x 5.6 cm = volume: 163.3 mL. Echogenicity within normal limits. No mass or hydronephrosis visualized. Bladder: Appears normal for degree of bladder distention. Other: Splenomegaly, better evaluated on prior CT. Prostate indents the base of the bladder, suggesting BPH. IMPRESSION: Suspected BPH. This may account for the patient's urinary retention, on the basis of chronic bladder outlet obstruction. No hydronephrosis. Splenomegaly, previously noted on CT. Electronically Signed   By: Julian Hy M.D.   On: 12/01/2019 11:52     Scheduled Meds: . acyclovir  400 mg Oral BID  . allopurinol  300 mg Oral Daily  . Chlorhexidine Gluconate Cloth  6 each Topical Daily  . fluticasone  2 spray Each Nare Daily  . fluticasone furoate-vilanterol  1 puff Inhalation Daily  . montelukast  10 mg Oral Daily  . nicotine  21 mg Transdermal Daily  . pregabalin  200 mg Oral TID  . rosuvastatin  5 mg Oral Daily  . sodium chloride flush  10-40 mL Intracatheter Q12H  . tamsulosin  0.4  mg Oral Daily   Continuous Infusions: . sodium chloride 1,000 mL (11/30/19 0838)  . piperacillin-tazobactam (ZOSYN)  IV 3.375 g (12/01/19 0434)     LOS: 3 days   Time Spent in minutes   45 minutes  Corley Maffeo D.O. on 12/01/2019 at 12:11 PM  Between 7am to 7pm - Please see pager noted on [email or web address]  After 7pm go to [email or web address]  And look for the night coverage person covering for me after hours  [facility] Office  [phone]

## 2019-12-01 NOTE — Progress Notes (Signed)
  Speech Language Pathology Treatment: Dysphagia  Patient Details Name: Ryan Copeland MRN: [id number] DOB: 1951/01/02 Today's Date: 12/01/2019 Time: 2229-7989 SLP Time Calculation (min) (ACUTE ONLY): 8 min  Assessment / Plan / Recommendation Clinical Impression  Pt was seen for dysphagia treatment and was cooperative throughout the session. A dysphagia 2 diet was recommended on 10/15 but his diet was advanced to regular textures today by MD. Pt and nursing reported that the pt has been tolerating the p.o. intake without overt s/sx of aspiration. Pt expressed his displeasure regarding his being on dysphagia 2 solids and stated that the medical team is "making assumptions" about him. When he was advised of his presentation on 10/15 and the reason for the diet recommendation, pt stated "well your evaluation was wrong." Pt's mentation has improved significantly since the evaluation on 10/15 and he is now tolerating puree solids, regular texture solids, and thin liquids via straw using consecutive swallows without symptoms of oropharyngeal dysphagia. It is recommended that the current diet of regular textures ordered by the MD today be continued. Further skilled SLP services are not clinically indicated at this time.    HPI HPI: Pt is a 69 y.o. 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  CXR 10/14: Extensive peripheral predominant airspace opacities in the left midlung and lung base, compatible with pneumonia.       SLP Plan  All goals met;Discharge SLP treatment due to (comment)       Recommendations  Diet recommendations: Regular;Thin liquid Liquids provided via: Cup;Straw Medication Administration: Whole meds with liquid Supervision: Patient able to self feed                Oral Care Recommendations: Oral care BID Follow up  Recommendations: Other (comment) (TBD) SLP Visit Diagnosis: Dysphagia, oral phase (R13.11) Plan: All goals met;Discharge SLP treatment due to (comment)       Ryan Copeland, [place], [place] Office number [phone] Pager [place] 12/01/2019, 9:03 AM

## 2019-12-01 NOTE — Progress Notes (Signed)
Spoke to the patient and relayed that transport will be here shortly to bring the patient to radiology for a renal ultrasound. Patient relayed to RN that patient would like for radiology to come later as he was wanting to text his wife. Told the patient that radiology calls and comes when they are ready to retrieve the patient to perform such test. Patient further relayed that "he is on his own time during this hospital stay". Patient also insist on shower order and would like RN to page MD for shower orders. MD paged to notify.

## 2019-12-01 NOTE — Progress Notes (Signed)
Patient has a concern about home medication schedule for the medication lyrica. Relayed to pharmacy about concern and have written a pharmacy medication memo about the times he takes it at home. He would like to take his medication at 0700,1200 and 1700. Relayed to the PM RN about this concern to share with tomorrow AM RN.

## 2019-12-01 NOTE — Progress Notes (Signed)
Pharmacy Antibiotic Note  Ryan Copeland is a 69 y.o. male admitted on 11/28/2019 with PNA with worsening symptoms despite outpatient course of antibiotics.  Pharmacy has been consulted for Vancomycin and Zosyn dosing.  Today is day#4 of IV antibiotics.  MRSA PCR negative.  Renal function stable with SCr <1.  Plan: Continue Zosyn 3.375g IV q8h (4 hour infusion).  MRSA PCR negative - D/C Vanc per Dr. Ree Kida  Height: 6' (182.9 cm) Weight: 74 kg (163 lb 3.2 oz) (scale c) IBW/kg (Calculated) : 77.6  Temp (24hrs), Avg:98.3 F (36.8 C), Min:97.7 F (36.5 C), Max:99.6 F (37.6 C)  Recent Labs  Lab 11/28/19 1131 11/28/19 1131 11/28/19 1243 11/28/19 1846 11/28/19 2137 11/28/19 2304 11/29/19 0402 11/30/19 0443 12/01/19 0456  WBC 3.7*   < >  --  3.6*  --  3.0* 3.2* 3.0* 2.0*  CREATININE 1.01  --   --  0.96  --   --  0.92 1.02 0.94  LATICACIDVEN  --   --  1.7  --  0.9  --   --   --   --    < > = values in this interval not displayed.    Estimated Creatinine Clearance: 78.7 mL/min (by C-G formula based on SCr of 0.94 mg/dL).    Allergies  Allergen Reactions  . Privigen [Immune Globulin] Hives    Pt reports hives/rash on body with IV Privigen. He was changed to IV Gamunex-C and had many infusions without adverse side effects.  . Codeine Hives    Pt states he can take a few, more reaction with extended doses.    Antimicrobials this admission: Acyclovir PTA for px Vanc 10/14 >> 10/17 Zosyn 10/14 >>  Dose adjustments this admission: n/a  Microbiology results: 10/14 BCx: ngtd 10/14 UCx: negative 10/14 resp panel: neg 10/14 strep pna neg 10/14 legionella: 10/16 MRSA PCR neg  Thank you for allowing pharmacy to be a part of this patient's care.  [employer], Pharm.D., BCPS Clinical Pharmacist Clinical phone for 12/01/2019 from 8:30-4:00 is [phone].  **Pharmacist phone directory can be found on [email or web address] listed under [place].  12/01/2019 7:36 AM

## 2019-12-01 NOTE — Progress Notes (Signed)
Patient's 1200 bladder scan is 646. Paged MD to notify. MD verbal order to in and out the patient is the bladder scan volume is > 53ml. Performed peri care and maintain sterile technique during the in and out. Applied lidocaine gel. Post residual volume of 860ml

## 2019-12-02 LAB — CBC
HCT: 27 % — ABNORMAL LOW (ref 39.0–52.0)
Hemoglobin: 8.8 g/dL — ABNORMAL LOW (ref 13.0–17.0)
MCH: 33.3 pg (ref 26.0–34.0)
MCHC: 32.6 g/dL (ref 30.0–36.0)
MCV: 102.3 fL — ABNORMAL HIGH (ref 80.0–100.0)
Platelets: 33 10*3/uL — ABNORMAL LOW (ref 150–400)
RBC: 2.64 MIL/uL — ABNORMAL LOW (ref 4.22–5.81)
RDW: 16.5 % — ABNORMAL HIGH (ref 11.5–15.5)
WBC: 2.3 10*3/uL — ABNORMAL LOW (ref 4.0–10.5)
nRBC: 0 % (ref 0.0–0.2)

## 2019-12-02 LAB — BASIC METABOLIC PANEL
Anion gap: 4 — ABNORMAL LOW (ref 5–15)
BUN: 9 mg/dL (ref 8–23)
CO2: 27 mmol/L (ref 22–32)
Calcium: 7.6 mg/dL — ABNORMAL LOW (ref 8.9–10.3)
Chloride: 107 mmol/L (ref 98–111)
Creatinine, Ser: 0.77 mg/dL (ref 0.61–1.24)
GFR, Estimated: >60 mL/min (ref >60)
Glucose, Bld: 102 mg/dL — ABNORMAL HIGH (ref 70–99)
Potassium: 3.8 mmol/L (ref 3.5–5.1)
Sodium: 138 mmol/L (ref 135–145)

## 2019-12-02 MED ORDER — ZOLPIDEM TARTRATE 5 MG PO TABS: 5.0000 mg | ORAL_TABLET | Freq: Every evening | ORAL | Status: DC | PRN

## 2019-12-02 MED ORDER — HYDROCODONE-HOMATROPINE 5-1.5 MG/5ML PO SYRP: 5.0000 mL | ORAL_SOLUTION | Freq: Four times a day (QID) | ORAL | Status: DC | PRN

## 2019-12-02 MED ORDER — MONTELUKAST SODIUM 10 MG PO TABS
10.0000 mg | ORAL_TABLET | Freq: Every day | ORAL | Status: DC
  Administered 2019-12-03 – 2019-12-04 (×2): 10 mg via ORAL
  Filled 2019-12-02 (×2): qty 1

## 2019-12-02 NOTE — Progress Notes (Addendum)
PROGRESS NOTE    Ryan Copeland  WFU:[id number] DOB: April 18, 1950 DOA: 11/28/2019 PCP: [name]., PA-C   Brief Narrative:  HPI on 11/28/2019 by Dr. Eric [place] Ryan Copeland is a 69 y.o. 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  84 of HPI obtained from patient spouse, ED physician, EMS reports due to patient's altered mental status and inability to participate in HPI.  Apparently, patient has been slowly declining since his recent second Covid-19 vaccination on September 2021.  He was seen by his PCP recently on 11/20/2019 and given treatment with azithromycin, Levaquin and prednisone.  He continues to slowly decline with associated mental status changes, weakness.  No other recent changes in his medications or sick contacts.  Interim history  Admitted with sepsis secondary to pneumonia and AMS. Patient now with urinary retention.  Assessment & Plan   Severe sepsis with acute hypoxic respiratory failure secondary to community-acquired pneumonia -Sepsis present on admission as patient was noted to be febrile with tachycardia.  Chest x-ray on admission showed extensive peripheral opacity in the left midlung/lung base consistent with community-acquired pneumonia.  Patient was also noted to have hypoxia with oxygen saturations of 82% on room air. -patient is also immunocompromised -Patient placed on supplemental oxygen along with IV fluids, vancomycin and Zosyn -Has been weaned to room air -Of note, patient failed outpatient treatment with azithromycin and Levaquin -COVID-19 PCR as well as rapid influenza negative -Strep pneumonia urine antigen negative, pending Legionella urine antigen -Blood cultures show no growth to date -UA unremarkable for infection; Urine culture showed no growth -Procalcitonin 2.21 -CTA  chest negative for PE. Bilateral areas of lung consolidation consistent with multifocal pneumonia, areas are nodular to mass-like suspected to be infectious in etiology. Repeat CT recommended after therapy and symptom improvement.  -MRSA swab negative, discontinued vancomycin -Will de-escalate antibiotics, will place him on Unasyn, which will allow for easy transition to oral antibiotics on discharge -Continue Tessalon Perles.  Will also order Hycodan for productive cough  Acute metabolic encephalopathy -Secondary to the above -Improved, AAO x3 -TSH WNL, HIV and RPR nonreactive.  Vitamin B12 106 -CT head: no acute intracranial pathology.  -Patient did require several doses of Haldol and was placed in four-point restraints -continue to monitor closely and treat underlying conditions  Vitamin B 12 deficiency -Continue supplemental injections during hospitalization and discharge patient with oral supplementation  CLL, small B-cell type -Patient follows with Dr. Alvy Bimler and currently on chemotherapy Calquence -Chemo currently on hold -Continue acyclovir -Discussed with Dr. Alvy Bimler, patient is doing well with his treatment and is close to remission. Do not resume chemo until completion of antibiotics! -WBC stable -platelets have dropped to 33 today. Discontinued lovenox. Will order SCDs placement. Likely secondary to antibiotics. Would not transfuse until below 10. -Continue to monitor CBC  COPD -On examination, no wheezing appreciated -Patient being treated for pneumonia with acute hypoxic respiratory failure -Continue Breo Ellipta and nebulizer treatments as needed  Peripheral neuropathy -Patient does follow with a neurologist as an outpatient -Continue Lyrica  Physical deconditioning and debility -PT recommended HH -OT - no further needs  Acute Urinary retention -Patient feels that he has urinary frequency and is unable to urinate  -UA and culture are unremarkable for  infection -Patient with multiple bladder scan showing retention -We will continue to BladderScan every 4 hours,  if needed conduct in/out -Lidocaine gel has been ordered -Continue Flomax -Renal US: Suspected BPH, ?chronic bladder outlet obstruction. No hydronephrosis -Monitor intake and output -Discussed with Dr. Abner Greenspan, urology, recommended Foley catheter placement.  He will officially consult later this evening.  Hypokalemia -Replaced, continue to monitor and replace as needed -Magnesium 1.9  Chronic back pain -Continue hydrocodone  Hypotension -Patient currently asymptomatic and tells me he has low blood pressure all the time -Continue to monitor  Anemia -hemoglobin down to 8.8 on 10/17. Continues to be 8.8 today -Anemia panel shows iron 20, Ferritin 249 -Suspect drop secondary to bone marrow suppression from chemo -FOBT pending  -Continue to monitor CBC  History of Tobacco abuse -Patient states he has not smoked since 2018  DVT Prophylaxis SCDs  Code Status: Full  Family Communication: None at bedside. Wife via phone.  Disposition Plan:  Status is: Inpatient  Remains inpatient appropriate because:Altered mental status, IV treatments appropriate due to intensity of illness or inability to take PO and Inpatient level of care appropriate due to severity of illness   Dispo: The patient is from: Home              Anticipated d/c is to: Home              Anticipated d/c date is: 3 days              Patient currently is not medically stable to d/c.   Consultants  Dr. Alvy Bimler, oncology-not officially consulted Urology  Procedures  Renal US  Antibiotics   Anti-infectives (From admission, onward)   Start     Dose/Rate Route Frequency Ordered Stop   11/29/19 0230  vancomycin (VANCOCIN) IVPB 1000 mg/200 mL premix  Status:  Discontinued        1,000 mg 200 mL/hr over 60 Minutes Intravenous Every 12 hours 11/28/19 1325 12/01/19 0810   11/28/19 2200  acyclovir (ZOVIRAX)  tablet 400 mg        400 mg Oral 2 times daily 11/28/19 1846     11/28/19 2130  piperacillin-tazobactam (ZOSYN) IVPB 3.375 g        3.375 g 12.5 mL/hr over 240 Minutes Intravenous Every 8 hours 11/28/19 1325     11/28/19 1300  vancomycin (VANCOREADY) IVPB 1500 mg/300 mL        1,500 mg 150 mL/hr over 120 Minutes Intravenous  Once 11/28/19 1249 11/28/19 1844   11/28/19 1245  piperacillin-tazobactam (ZOSYN) IVPB 3.375 g        3.375 g 100 mL/hr over 30 Minutes Intravenous  Once 11/28/19 1238 11/28/19 1407   11/28/19 1245  vancomycin (VANCOCIN) IVPB 1000 mg/200 mL premix  Status:  Discontinued        1,000 mg 200 mL/hr over 60 Minutes Intravenous  Once 11/28/19 1238 11/28/19 1249      Subjective:   Andris Baumann seen and examined today.  Patient with no complaints this morning.  Would like more information on pneumonia.  Still having problems with urination but states he was able to urinate when he sat on the toilet earlier this morning.  Although while I was in there, bladder scan did show >999.  Patient denies any painful urination or burning.  Denies current chest pain, dizziness or headache, abdominal pain, nausea or vomiting, diarrhea or constipation.    Objective:   Vitals:   12/02/19 0205 12/02/19 0210 12/02/19 0215 12/02/19 0400  BP: (!) 78/56 (!) 90/55  101/72  Pulse:  Resp: 14 20 14 16   Temp:    97.6 F (36.4 C)  TempSrc:    Oral  SpO2:    94%  Weight:      Height:        Intake/Output Summary (Last 24 hours) at 12/02/2019 0709 Last data filed at 12/02/2019 0700 Gross per 24 hour  Intake 1502.59 ml  Output 800 ml  Net 702.59 ml   Filed Weights   11/29/19 0050 11/30/19 0604 12/01/19 0522  Weight: 72.1 kg 72.8 kg 74 kg   Exam  General: Well developed, chronically ill-appearing however no apparent distress  HEENT: NCAT, mucous membranes moist.   Cardiovascular: S1 S2 auscultated, RRR  Respiratory: Diminished breath sounds, no wheezing.+ Productive  cough  Abdomen: Soft, nontender, nondistended, + bowel sounds  Extremities: warm dry without cyanosis clubbing or edema  Neuro: AAOx3, nonfocal  Psych: appropriate mood and affect, pleasant  Data Reviewed: I have personally reviewed following labs and imaging studies  CBC: Recent Labs  Lab 11/28/19 1131 11/28/19 1846 11/28/19 2304 11/29/19 0402 11/30/19 0443 12/01/19 0456 12/02/19 0500  WBC 3.7*   < > 3.0* 3.2* 3.0* 2.0* 2.3*  NEUTROABS 2.4  --   --   --   --   --   --   HGB 12.3*   < > 11.5* 11.5* 10.9* 8.8* 8.8*  HCT 38.6*   < > 35.4* 34.0* 33.5* 26.7* 27.0*  MCV 102.4*   < > 99.7 99.7 100.6* 102.7* 102.3*  PLT PLATELET CLUMPS NOTED ON SMEAR, COUNT APPEARS DECREASED   < > 53* 52* 47* 39* 33*   < > = values in this interval not displayed.   Basic Metabolic Panel: Recent Labs  Lab 11/28/19 1131 11/28/19 1131 11/28/19 1846 11/29/19 0402 11/30/19 0443 12/01/19 0456 12/02/19 0500  NA 139  --   --  140 142 141 138  K 3.5  --   --  3.2* 2.8* 3.9 3.8  CL 105  --   --  106 105 109 107  CO2 26  --   --  24 26 24 27   GLUCOSE 76  --   --  92 107* 97 102*  BUN 16  --   --  16 11 11 9   CREATININE 1.01   < > 0.96 0.92 1.02 0.94 0.77  CALCIUM 8.3*  --   --  8.2* 8.2* 7.5* 7.6*  MG  --   --   --   --  1.7 1.9  --    < > = values in this interval not displayed.   GFR: Estimated Creatinine Clearance: 92.5 mL/min (by C-G formula based on SCr of 0.77 mg/dL). Liver Function Tests: Recent Labs  Lab 11/28/19 1131 11/29/19 0402 11/30/19 0443  AST 20 31 24   ALT 15 18 18   ALKPHOS 54 47 47  BILITOT 0.6 1.4* 1.5*  PROT 4.8* 4.4* 4.6*  ALBUMIN 2.8* 2.5* 2.5*   No results for input(s): LIPASE, AMYLASE in the last 168 hours. No results for input(s): AMMONIA in the last 168 hours. Coagulation Profile: Recent Labs  Lab 11/28/19 1131 11/29/19 0402  INR 1.2 1.4*   Cardiac Enzymes: No results for input(s): CKTOTAL, CKMB, CKMBINDEX, TROPONINI in the last 168 hours. BNP (last  3 results) No results for input(s): PROBNP in the last 8760 hours. HbA1C: No results for input(s): HGBA1C in the last 72 hours. CBG: Recent Labs  Lab 11/30/19 1201 11/30/19 1636 11/30/19 2125 12/01/19 0604 12/01/19 1205  GLUCAP 138*  120* 90 96 119*   Lipid Profile: No results for input(s): CHOL, HDL, LDLCALC, TRIG, CHOLHDL, LDLDIRECT in the last 72 hours. Thyroid Function Tests: No results for input(s): TSH, T4TOTAL, FREET4, T3FREE, THYROIDAB in the last 72 hours. Anemia Panel: Recent Labs    12/01/19 1842 12/01/19 1843  VITAMINB12 1,605*  --   FOLATE 23.2  --   FERRITIN 249  --   TIBC 150*  --   IRON 20*  --   RETICCTPCT  --  0.9   Urine analysis:    Component Value Date/Time   COLORURINE YELLOW 11/28/2019 1127   APPEARANCEUR CLEAR 11/28/2019 1127   LABSPEC 1.010 11/28/2019 1127   PHURINE 5.0 11/28/2019 1127   GLUCOSEU NEGATIVE 11/28/2019 1127   HGBUR NEGATIVE 11/28/2019 1127   [place] 11/28/2019 1127   KETONESUR NEGATIVE 11/28/2019 1127   PROTEINUR NEGATIVE 11/28/2019 1127   UROBILINOGEN 0.2 02/03/2014 1356   NITRITE NEGATIVE 11/28/2019 1127   LEUKOCYTESUR NEGATIVE 11/28/2019 1127   Sepsis Labs: @LABRCNTIP (procalcitonin:4,lacticidven:4)  ) Recent Results (from the past 240 hour(s))  Respiratory Panel by RT PCR (Flu A&B, Covid) -     Status: None   Collection Time: 11/28/19 11:27 AM   Specimen: Nasopharyngeal  Result Value Ref Range Status   SARS Coronavirus 2 by RT PCR NEGATIVE NEGATIVE Final    Comment: (NOTE) SARS-CoV-2 target nucleic acids are NOT DETECTED.  The SARS-CoV-2 RNA is generally detectable in upper respiratoy specimens during the acute phase of infection. The lowest concentration of SARS-CoV-2 viral copies this assay can detect is 131 copies/mL. A negative result does not preclude SARS-Cov-2 infection and should not be used as the sole basis for treatment or other patient management decisions. A negative result may occur  with  improper specimen collection/handling, submission of specimen other than nasopharyngeal swab, presence of viral mutation(s) within the areas targeted by this assay, and inadequate number of viral copies (<131 copies/mL). A negative result must be combined with clinical observations, patient history, and epidemiological information. The expected result is Negative.  Fact Sheet for Patients:  PinkCheek.be  Fact Sheet for Healthcare Providers:  GravelBags.it  This test is no t yet approved or cleared by the [place] FDA and  has been authorized for detection and/or diagnosis of SARS-CoV-2 by FDA under an Emergency Use Authorization (EUA). This EUA will remain  in effect (meaning this test can be used) for the duration of the COVID-19 declaration under Section 564(b)(1) of the Act, 21 U.S.C. section 360bbb-3(b)(1), unless the authorization is terminated or revoked sooner.     Influenza A by PCR NEGATIVE NEGATIVE Final   Influenza B by PCR NEGATIVE NEGATIVE Final    Comment: (NOTE) The Xpert Xpress SARS-CoV-2/FLU/RSV assay is intended as an aid in  the diagnosis of influenza from Nasopharyngeal swab specimens and  should not be used as a sole basis for treatment. Nasal washings and  aspirates are unacceptable for Xpert Xpress SARS-CoV-2/FLU/RSV  testing.  Fact Sheet for Patients: PinkCheek.be  Fact Sheet for Healthcare Providers: GravelBags.it  This test is not yet approved or cleared by the [place] FDA and  has been authorized for detection and/or diagnosis of SARS-CoV-2 by  FDA under an Emergency Use Authorization (EUA). This EUA will remain  in effect (meaning this test can be used) for the duration of the  Covid-19 declaration under Section 564(b)(1) of the Act, 21  U.S.C. section 360bbb-3(b)(1), unless the authorization is  terminated or  revoked. Performed at [facility], [address], [place], [place] [address]   Blood culture (routine single)     Status: None (Preliminary result)   Collection Time: 11/28/19 12:00 PM   Specimen: BLOOD RIGHT ARM  Result Value Ref Range Status   Specimen Description BLOOD RIGHT ARM  Final   Special Requests   Final    BOTTLES DRAWN AEROBIC AND ANAEROBIC Blood Culture adequate volume   Culture   Final    NO GROWTH 3 DAYS Performed at [facility], [place] [address], [place], [place] [address]    Report Status PENDING  Incomplete  Urine culture     Status: None   Collection Time: 11/28/19  7:16 PM   Specimen: In/Out Cath Urine  Result Value Ref Range Status   Specimen Description IN/OUT CATH URINE  Final   Special Requests NONE  Final   Culture   Final    NO GROWTH Performed at [facility], [place] [address], [place], [place] [address]    Report Status 11/30/2019 FINAL  Final  Culture, blood (single)     Status: None (Preliminary result)   Collection Time: 11/28/19  9:38 PM   Specimen: BLOOD  Result Value Ref Range Status   Specimen Description BLOOD LEFT ANTECUBITAL  Final   Special Requests   Final    BOTTLES DRAWN AEROBIC AND ANAEROBIC Blood Culture results may not be optimal due to an excessive volume of blood received in culture bottles   Culture   Final    NO GROWTH 3 DAYS Performed at [facility], [place] [address] [place], [place] [address]    Report Status PENDING  Incomplete  MRSA PCR Screening     Status: None   Collection Time: 11/30/19  8:38 AM   Specimen: Nasopharyngeal  Result Value Ref Range Status   MRSA by PCR NEGATIVE NEGATIVE Final    Comment:        The GeneXpert MRSA Assay (FDA approved for NASAL specimens only), is one component of a comprehensive MRSA colonization surveillance program. It is not intended to diagnose MRSA infection nor to guide or monitor treatment for MRSA infections. Performed at [facility],  [place] [address], [place],  [address]       Radiology Studies: US RENAL  Result Date: 12/01/2019 CLINICAL DATA:  Urinary retention EXAM: RENAL / URINARY TRACT ULTRASOUND COMPLETE COMPARISON:  CT abdomen/pelvis dated 11/29/2018 FINDINGS: Right Kidney: Renal measurements: 10.4 x 5.3 x 4.9 cm = volume: 142.5 mL. Echogenicity within normal limits. No mass or hydronephrosis visualized. Left Kidney: Renal measurements: 11.9 x 4.7 x 5.6 cm = volume: 163.3 mL. Echogenicity within normal limits. No mass or hydronephrosis visualized. Bladder: Appears normal for degree of bladder distention. Other: Splenomegaly, better evaluated on prior CT. Prostate indents the base of the bladder, suggesting BPH. IMPRESSION: Suspected BPH. This may account for the patient's urinary retention, on the basis of chronic bladder outlet obstruction. No hydronephrosis. Splenomegaly, previously noted on CT. Electronically Signed   By: Julian Hy M.D.   On: 12/01/2019 11:52     Scheduled Meds: . acyclovir  400 mg Oral BID  . allopurinol  300 mg Oral Daily  . B-complex with vitamin C  1 tablet Oral Daily  . Chlorhexidine Gluconate Cloth  6 each Topical Daily  . cholecalciferol  1,000 Units Oral Daily  . fluticasone  2 spray Each Nare Daily  . fluticasone furoate-vilanterol  1 puff Inhalation Daily  . montelukast  10 mg Oral Daily  . pregabalin  200 mg Oral TID  . rosuvastatin  5 mg Oral Daily  . sodium chloride flush  10-40 mL Intracatheter Q12H  . tamsulosin  0.4 mg Oral Daily   Continuous Infusions: . sodium chloride 1,000 mL (11/30/19 0838)  . piperacillin-tazobactam (ZOSYN)  IV 3.375 g (12/02/19 0627)     LOS: 4 days   Time Spent in minutes   45 minutes (greater than 50% of time spent with patient face to face, as well as reviewing records with patient, calling wife, discussing with consultants, and formulating a plan)  Cristal Ford D.O. on 12/02/2019 at 7:09 AM  Between 7am to 7pm - Please see pager  noted on [email or web address]  After 7pm go to [email or web address]  And look for the night coverage person covering for me after hours  [facility] Office  [phone]

## 2019-12-02 NOTE — Progress Notes (Signed)
OT Cancellation Note  Patient Details Name: ESHAAN TITZER MRN: [id number] DOB: 03-31-1950   Cancelled Treatment:    Reason Eval/Treat Not Completed: Pain limiting ability to participate;Fatigue/lethargy limiting ability to participate;Other (comment) Pt reports pain from catheter being place and fatigue. Pt declined OT session, will check back as time allows.   Lanier Clam., COTA/L Acute Rehabilitation Services [phone]   Ihor Gully 12/02/2019, 1:32 PM

## 2019-12-02 NOTE — Consult Note (Signed)
Urology Consult   Physician requesting consult: Cristal Ford  Reason for consult: Acute urinary retention  History of Present Illness: Ryan Copeland is a 69 y.o. who is seen in consultation for acute urinary retention.  He presented to the hospital with acute metabolic encephalopathy with severe sepsis secondary to community-acquired pneumonia.  He was placed on broad-spectrum antibiotics as currently not requiring oxygen therapy.  Over the course of his hospitalization, he has had multiple bladder scans demonstrating elevated postvoid residuals in the 500-800 range.  He has been undergoing several in and out catheterizations and was reluctant to undergo Foley catheter placement.  Finally elected for Foley catheter placement earlier today.  The Foley has been draining clear yellow urine.  Part his hospitalization, he states he had a good flow of stream, no particular urinary frequency or urgency.  He was previously not taking alpha blocker has not been placed on Flomax which she is tolerating without side effects. He denied gross hematuria or dysuria.  He denies sensation of incomplete bladder emptying.  He denies a family history of urologic malignancy.  He was previously seen in the office around 2018 with Dr. Tresa Moore for concerns with fatigue and low energy levels, erectile dysfunction.  After full discussion with him, it was elected not to pursue testosterone placement therapy as he had multiple other etiologies for fatigue.  He denies a history of voiding or storage urinary symptoms, hematuria, UTIs, STDs, urolithiasis, GU malignancy/trauma/surgery.  Past Medical History:  Diagnosis Date  . Anxiety 01/30/2014  . Back injury    lower disc  . CAD (coronary artery disease)   . CLL (chronic lymphocytic leukemia) ([place]) 03/18/2011  . COPD (chronic obstructive pulmonary disease) ([place])   . Diabetes mellitus ([place])    Type 2   . ECRB (extensor carpi radialis brevis) tenosynovitis   . GERD  (gastroesophageal reflux disease)    takes Nexium if needed  . Hyperlipidemia   . Lateral epicondylitis of left elbow   . MI, acute, non ST segment elevation ([place]) 06/28/2009   with stenting of the LAD  . Neuromuscular disorder ([place])    peripheral neuropathy  . PVD (peripheral vascular disease) ([place])   . Tobacco abuse     Past Surgical History:  Procedure Laterality Date  . ADENOIDECTOMY  1955  . CARDIAC CATHETERIZATION    . CARDIAC CATHETERIZATION N/A 09/26/2014   Procedure: Left Heart Cath and Coronary Angiography;  Surgeon: Peter M Martinique, MD;  Location: [facility];  Service: Cardiovascular;  Laterality: N/A;  . carpel tunnel release Left 04-1989  . carpel tunnel release  Right 01-1989  . CHOLECYSTECTOMY  2007  . CORONARY STENT PLACEMENT  May 2011  . femoral stents    . IR CV LINE INJECTION  08/18/2017  . IR CV LINE INJECTION  09/01/2017  . IR CV LINE INJECTION  02/02/2018  . LATERAL EPICONDYLE RELEASE Left 02/12/2014   Procedure: LEFT ELBOW DEBRIDEMENT WITH TENDON REPAIR ;  Surgeon: Lorn Junes, MD;  Location: [facility];  Service: Orthopedics;  Laterality: Left;  . LEFT CAI STENT/PTA AND POPLITEAL ARTERY/TIBIAL THROMBECTOMY     . LEFT HEART CATHETERIZATION WITH CORONARY ANGIOGRAM N/A 08/26/2011   Procedure: LEFT HEART CATHETERIZATION WITH CORONARY ANGIOGRAM;  Surgeon: Peter M Martinique, MD;  Location: [facility];  Service: Cardiovascular;  Laterality: N/A;  . PERIPHERAL VASCULAR CATHETERIZATION N/A 01/01/2015   Procedure: Abdominal Aortogram;  Surgeon: Conrad Stonewall, MD;  Location: [facility];  Service: Cardiovascular;  Laterality: N/A;  . TARSAL TUNNEL RELEASE Bilateral 08-2007    Medications:  Home meds:  Current Facility-Administered Medications on File Prior to Encounter  Medication Dose Route Frequency Provider Last Rate Last Admin  . 0.9 %  sodium chloride infusion   Intravenous Continuous [name], MD 50 mL/hr at 03/07/14 1005 New Bag at 03/07/14 1005  .  sodium chloride 0.9 % injection 10 mL  10 mL Intracatheter PRN Marcy Panning, MD   10 mL at 08/22/12 1721   Current Outpatient Medications on File Prior to Encounter  Medication Sig Dispense Refill  . acyclovir (ZOVIRAX) 400 MG tablet TAKE 1 TABLET BY MOUTH TWICE A DAY (Patient taking differently: Take 400 mg by mouth 2 (two) times daily. ) 180 tablet 1  . albuterol (PROVENTIL HFA) 108 (90 Base) MCG/ACT inhaler TAKE 2 PUFFS BY MOUTH EVERY 6 HOURS AS NEEDED FOR WHEEZE OR SHORTNESS OF BREATH (Patient taking differently: Inhale 2 puffs into the lungs every 6 (six) hours as needed for wheezing or shortness of breath. TAKE 2 PUFFS BY MOUTH EVERY 6 HOURS AS NEEDED FOR WHEEZE OR SHORTNESS OF BREATH) 18 g 11  . allopurinol (ZYLOPRIM) 300 MG tablet TAKE 1 TABLET BY MOUTH EVERY DAY (Patient taking differently: Take 300 mg by mouth daily. ) 90 tablet 1  . ALPRAZolam (XANAX) 0.25 MG tablet Take 0.5 mg by mouth as needed for anxiety.     Marland Kitchen b complex vitamins tablet Take 1 tablet by mouth daily.     . benzonatate (TESSALON) 100 MG capsule Take 2 capsules (200 mg total) by mouth 3 (three) times daily as needed for cough. 30 capsule 2  . CALQUENCE 100 MG capsule TAKE 100 MG BY MOUTH 2 (TWO) TIMES DAILY. (Patient taking differently: Take 100 mg by mouth 2 (two) times daily. ) 60 capsule 11  . Cholecalciferol (VITAMIN D-1000 MAX ST) 1000 units tablet Take 1,000 Units by mouth daily.     . famotidine (PEPCID) 20 MG tablet TAKE 1 TABLET BY MOUTH TWICE A DAY (Patient taking differently: Take 20 mg by mouth 2 (two) times daily as needed for heartburn. ) 180 tablet 3  . fluticasone furoate-vilanterol (BREO ELLIPTA) 200-25 MCG/INH AEPB Inhale 1 puff into the lungs daily. 1 each 5  . furosemide (LASIX) 20 MG tablet TAKE 1 TABLET BY MOUTH EVERY DAY AS NEEDED (Patient taking differently: Take 20 mg by mouth daily as needed for fluid or edema. ) 90 tablet 2  . HYDROcodone-acetaminophen (NORCO) 10-325 MG tablet Take 1 tablet by  mouth See admin instructions. Every 6 to 8 hours as needed for pain  0  . HYDROcodone-homatropine (HYCODAN) 5-1.5 MG/5ML syrup Take 5 mLs by mouth every 6 (six) hours as needed for cough.    . lidocaine-prilocaine (EMLA) cream APPLY TO AFFECTED AREA ONCE (Patient taking differently: Apply 1 application topically once. ) 30 g 3  . loperamide (IMODIUM) 2 MG capsule Take 2 capsules (4 mg total) by mouth every 6 (six) hours as needed for diarrhea or loose stools. 90 capsule 2  . meclizine (ANTIVERT) 12.5 MG tablet Take 12.5 mg by mouth every 6 (six) hours as needed for dizziness.   0  . metFORMIN (GLUCOPHAGE-XR) 500 MG 24 hr tablet Take 500-1,000 mg by mouth See admin instructions. Takes one tablet (500mg )  in AM and 2 tablets (1000mg ) at night.    . mometasone (NASONEX) 50 MCG/ACT nasal spray Place 2 sprays into the nose daily. 1 each 11  . montelukast (SINGULAIR)  10 MG tablet Take 1 tablet (10 mg total) by mouth daily. 30 tablet 5  . nitroGLYCERIN (NITROSTAT) 0.4 MG SL tablet PLACE 1 TABLET UNDER THE TONGUE EVERY 5 MINUTES X 3 DOSES AS NEEDED FOR CHEST PAIN *MAX 3 DOSES* (Patient taking differently: Place 0.4 mg under the tongue See admin instructions. PLACE 1 TABLET UNDER THE TONGUE EVERY 5 MINUTES X 3 DOSES AS NEEDED FOR CHEST PAIN *MAX 3 DOSES*) 25 tablet 3  . predniSONE (DELTASONE) 20 MG tablet Take 20 mg by mouth See admin instructions. Take 1 tablet (20mg ) by mouth three times daily for 3 days, then take 1 tablet (20mg ) by mouth twice daily for 3 days, then take 1 tablet by mouth daily for 3 days.    . pregabalin (LYRICA) 200 MG capsule TAKE 1 CAPSULE (200 MG TOTAL) BY MOUTH 3 (THREE) TIMES DAILY. 270 capsule 1  . rosuvastatin (CRESTOR) 5 MG tablet TAKE 1 TABLET BY MOUTH EVERY DAY (Patient taking differently: Take 5 mg by mouth daily. ) 90 tablet 3  . zolpidem (AMBIEN) 10 MG tablet Take 10 mg by mouth at bedtime as needed for sleep.       Scheduled Meds: . acyclovir  400 mg Oral BID  .  allopurinol  300 mg Oral Daily  . B-complex with vitamin C  1 tablet Oral Daily  . Chlorhexidine Gluconate Cloth  6 each Topical Daily  . cholecalciferol  1,000 Units Oral Daily  . fluticasone  2 spray Each Nare Daily  . fluticasone furoate-vilanterol  1 puff Inhalation Daily  . [START ON 12/03/2019] montelukast  10 mg Oral QHS  . pregabalin  200 mg Oral TID  . rosuvastatin  5 mg Oral Daily  . sodium chloride flush  10-40 mL Intracatheter Q12H  . tamsulosin  0.4 mg Oral Daily   Continuous Infusions: . sodium chloride 1,000 mL (11/30/19 0838)  . piperacillin-tazobactam (ZOSYN)  IV 3.375 g (12/02/19 1258)   PRN Meds:.sodium chloride, acetaminophen **OR** acetaminophen, albuterol, albuterol, ALPRAZolam, benzonatate, bisacodyl, haloperidol lactate, HYDROcodone-acetaminophen, HYDROcodone-homatropine, ipratropium, lidocaine, ondansetron **OR** ondansetron (ZOFRAN) IV, polyethylene glycol, sodium chloride flush, zolpidem  Allergies:  Allergies  Allergen Reactions  . Privigen [Immune Globulin] Hives    Pt reports hives/rash on body with IV Privigen. He was changed to IV Gamunex-C and had many infusions without adverse side effects.  . Codeine Hives    Pt states he can take a few, more reaction with extended doses.    Family History  Problem Relation Age of Onset  . Lung cancer Mother 31  . Cancer Mother        lung  . Heart failure Father 4  . Heart disease Father     Social History:  reports that he quit smoking about 3 years ago. His smoking use included cigarettes. He has a 44.00 pack-year smoking history. He has quit using smokeless tobacco. He reports that he does not drink alcohol and does not use drugs.  ROS: A complete review of systems was performed.  All systems are negative except for pertinent findings as noted.  Physical Exam:  Vital signs in last 24 hours: Temp:  [97.6 F (36.4 C)-99.4 F (37.4 C)] 97.9 F (36.6 C) (10/18 1520) Pulse Rate:  [61-80] 61 (10/18  1520) Resp:  [14-20] 16 (10/18 1520) BP: (73-101)/(43-72) 93/51 (10/18 1520) SpO2:  [91 %-96 %] 96 % (10/18 1520) Constitutional:  Alert and oriented, No acute distress Cardiovascular: Regular rate and rhythm Respiratory: Normal respiratory effort, Lungs clear bilaterally  GI: Abdomen is soft, nontender, nondistended, no abdominal masses Genitourinary: No CVAT. Normal male phallus, testes are descended bilaterally and non-tender and without masses, scrotum is normal in appearance without lesions or masses, perineum is normal on inspection. Rectal: Normal sphincter tone, no rectal masses, prostate is non tender and without nodularity. Prostate size is estimated to be 50 cc, no nodules Neurologic: Grossly intact, no focal deficits Psychiatric: Normal mood and affect  Laboratory Data:  Recent Labs    11/30/19 0443 12/01/19 0456 12/02/19 0500  WBC 3.0* 2.0* 2.3*  HGB 10.9* 8.8* 8.8*  HCT 33.5* 26.7* 27.0*  PLT 47* 39* 33*    Recent Labs    11/30/19 0443 12/01/19 0456 12/02/19 0500  NA 142 141 138  K 2.8* 3.9 3.8  CL 105 109 107  GLUCOSE 107* 97 102*  BUN 11 11 9   CALCIUM 8.2* 7.5* 7.6*  CREATININE 1.02 0.94 0.77     Results for orders placed or performed during the hospital encounter of 11/28/19 (from the past 24 hour(s))  Vitamin B12     Status: Abnormal   Collection Time: 12/01/19  6:42 PM  Result Value Ref Range   Vitamin B-12 1,605 (H) 180 - 914 pg/mL  Folate     Status: None   Collection Time: 12/01/19  6:42 PM  Result Value Ref Range   Folate 23.2 >5.9 ng/mL  Iron and TIBC     Status: Abnormal   Collection Time: 12/01/19  6:42 PM  Result Value Ref Range   Iron 20 (L) 45 - 182 ug/dL   TIBC 150 (L) 250 - 450 ug/dL   Saturation Ratios 13 (L) 17.9 - 39.5 %   UIBC 130 ug/dL  Ferritin     Status: None   Collection Time: 12/01/19  6:42 PM  Result Value Ref Range   Ferritin 249 24 - 336 ng/mL  Reticulocytes     Status: Abnormal   Collection Time: 12/01/19  6:43  PM  Result Value Ref Range   Retic Ct Pct 0.9 0.4 - 3.1 %   RBC. 2.75 (L) 4.22 - 5.81 MIL/uL   Retic Count, Absolute 25.6 19.0 - 186.0 K/uL   Immature Retic Fract 4.7 2.3 - 15.9 %  CBC     Status: Abnormal   Collection Time: 12/02/19  5:00 AM  Result Value Ref Range   WBC 2.3 (L) 4.0 - 10.5 K/uL   RBC 2.64 (L) 4.22 - 5.81 MIL/uL   Hemoglobin 8.8 (L) 13.0 - 17.0 g/dL   HCT 27.0 (L) 39 - 52 %   MCV 102.3 (H) 80.0 - 100.0 fL   MCH 33.3 26.0 - 34.0 pg   MCHC 32.6 30.0 - 36.0 g/dL   RDW 16.5 (H) 11.5 - 15.5 %   Platelets 33 (L) 150 - 400 K/uL   nRBC 0.0 0.0 - 0.2 %  Basic metabolic panel     Status: Abnormal   Collection Time: 12/02/19  5:00 AM  Result Value Ref Range   Sodium 138 135 - 145 mmol/L   Potassium 3.8 3.5 - 5.1 mmol/L   Chloride 107 98 - 111 mmol/L   CO2 27 22 - 32 mmol/L   Glucose, Bld 102 (H) 70 - 99 mg/dL   BUN 9 8 - 23 mg/dL   Creatinine, Ser 0.77 0.61 - 1.24 mg/dL   Calcium 7.6 (L) 8.9 - 10.3 mg/dL   GFR, Estimated >60 >60 mL/min   Anion gap 4 (L) 5 - 15   *  Note: Due to a large number of results and/or encounters for the requested time period, some results have not been displayed. A complete set of results can be found in Results Review.   Recent Results (from the past 240 hour(s))  Respiratory Panel by RT PCR (Flu A&B, Covid) -     Status: None   Collection Time: 11/28/19 11:27 AM   Specimen: Nasopharyngeal  Result Value Ref Range Status   SARS Coronavirus 2 by RT PCR NEGATIVE NEGATIVE Final    Comment: (NOTE) SARS-CoV-2 target nucleic acids are NOT DETECTED.  The SARS-CoV-2 RNA is generally detectable in upper respiratoy specimens during the acute phase of infection. The lowest concentration of SARS-CoV-2 viral copies this assay can detect is 131 copies/mL. A negative result does not preclude SARS-Cov-2 infection and should not be used as the sole basis for treatment or other patient management decisions. A negative result may occur with  improper  specimen collection/handling, submission of specimen other than nasopharyngeal swab, presence of viral mutation(s) within the areas targeted by this assay, and inadequate number of viral copies (<131 copies/mL). A negative result must be combined with clinical observations, patient history, and epidemiological information. The expected result is Negative.  Fact Sheet for Patients:  PinkCheek.be  Fact Sheet for Healthcare Providers:  GravelBags.it  This test is no t yet approved or cleared by the [place] FDA and  has been authorized for detection and/or diagnosis of SARS-CoV-2 by FDA under an Emergency Use Authorization (EUA). This EUA will remain  in effect (meaning this test can be used) for the duration of the COVID-19 declaration under Section 564(b)(1) of the Act, 21 U.S.C. section 360bbb-3(b)(1), unless the authorization is terminated or revoked sooner.     Influenza A by PCR NEGATIVE NEGATIVE Final   Influenza B by PCR NEGATIVE NEGATIVE Final    Comment: (NOTE) The Xpert Xpress SARS-CoV-2/FLU/RSV assay is intended as an aid in  the diagnosis of influenza from Nasopharyngeal swab specimens and  should not be used as a sole basis for treatment. Nasal washings and  aspirates are unacceptable for Xpert Xpress SARS-CoV-2/FLU/RSV  testing.  Fact Sheet for Patients: PinkCheek.be  Fact Sheet for Healthcare Providers: GravelBags.it  This test is not yet approved or cleared by the [place] FDA and  has been authorized for detection and/or diagnosis of SARS-CoV-2 by  FDA under an Emergency Use Authorization (EUA). This EUA will remain  in effect (meaning this test can be used) for the duration of the  Covid-19 declaration under Section 564(b)(1) of the Act, 21  U.S.C. section 360bbb-3(b)(1), unless the authorization is  terminated or revoked. Performed at  [facility], [place] [address], [place], [place] [address]   Blood culture (routine single)     Status: None (Preliminary result)   Collection Time: 11/28/19 12:00 PM   Specimen: BLOOD RIGHT ARM  Result Value Ref Range Status   Specimen Description BLOOD RIGHT ARM  Final   Special Requests   Final    BOTTLES DRAWN AEROBIC AND ANAEROBIC Blood Culture adequate volume   Culture   Final    NO GROWTH 4 DAYS Performed at [facility], [place] [address], [place],  [address]    Report Status PENDING  Incomplete  Urine culture     Status: None   Collection Time: 11/28/19  7:16 PM   Specimen: In/Out Cath Urine  Result Value Ref Range Status   Specimen Description IN/OUT CATH URINE  Final  Special Requests NONE  Final   Culture   Final    NO GROWTH Performed at [facility], [place] [address] [place], [place] [address]    Report Status 11/30/2019 FINAL  Final  Culture, blood (single)     Status: None (Preliminary result)   Collection Time: 11/28/19  9:38 PM   Specimen: BLOOD  Result Value Ref Range Status   Specimen Description BLOOD LEFT ANTECUBITAL  Final   Special Requests   Final    BOTTLES DRAWN AEROBIC AND ANAEROBIC Blood Culture results may not be optimal due to an excessive volume of blood received in culture bottles   Culture   Final    NO GROWTH 4 DAYS Performed at [facility], [place] [address] [place], [place] [address]    Report Status PENDING  Incomplete  MRSA PCR Screening     Status: None   Collection Time: 11/30/19  8:38 AM   Specimen: Nasopharyngeal  Result Value Ref Range Status   MRSA by PCR NEGATIVE NEGATIVE Final    Comment:        The GeneXpert MRSA Assay (FDA approved for NASAL specimens only), is one component of a comprehensive MRSA colonization surveillance program. It is not intended to diagnose MRSA infection nor to guide or monitor treatment for MRSA infections. Performed at [facility], [place] [address],  [place], [place] [address]     Renal Function: Recent Labs    11/28/19 1131 11/28/19 1846 11/29/19 0402 11/30/19 0443 12/01/19 0456 12/02/19 0500  CREATININE 1.01 0.96 0.92 1.02 0.94 0.77   Estimated Creatinine Clearance: 92.5 mL/min (by C-G formula based on SCr of 0.77 mg/dL).  Radiologic Imaging: US RENAL  Result Date: 12/01/2019 CLINICAL DATA:  Urinary retention EXAM: RENAL / URINARY TRACT ULTRASOUND COMPLETE COMPARISON:  CT abdomen/pelvis dated 11/29/2018 FINDINGS: Right Kidney: Renal measurements: 10.4 x 5.3 x 4.9 cm = volume: 142.5 mL. Echogenicity within normal limits. No mass or hydronephrosis visualized. Left Kidney: Renal measurements: 11.9 x 4.7 x 5.6 cm = volume: 163.3 mL. Echogenicity within normal limits. No mass or hydronephrosis visualized. Bladder: Appears normal for degree of bladder distention. Other: Splenomegaly, better evaluated on prior CT. Prostate indents the base of the bladder, suggesting BPH. IMPRESSION: Suspected BPH. This may account for the patient's urinary retention, on the basis of chronic bladder outlet obstruction. No hydronephrosis. Splenomegaly, previously noted on CT. Electronically Signed   By: Julian Hy M.D.   On: 12/01/2019 11:52    I independently reviewed the above imaging studies.  Impression/Recommendation 1. Acute urinary retention 2. 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  Can void trial if mobile and having good bowel movements in 7 days.  Otherwise, we can void trial outpatient in the office. -Continue Flomax -Discussed that he has significant degree of bladder outlet obstruction resulting in acute urinary retention.  We discussed medical and surgical therapies briefly.  He will follow-up as an outpatient setting to discuss procedures for bladder outlet  obstruction. 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 -Follow peripherally.  Please call with questions.  Janith Lima 12/02/2019, 5:24 PM  [facility]  Pager: [phone]   CC: [facility]

## 2019-12-02 NOTE — Progress Notes (Signed)
Physical Therapy Treatment Patient Details Name: Ryan Copeland MRN: [id number] DOB: Jun 25, 1950 Today's Date: 12/02/2019    History of Present Illness Ryan Copeland is a 69 y.o. 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  79 of HPI obtained from patient spouse, ED physician, EMS reports due to patient's altered mental status and inability to participate in HPI.  Apparently, patient has been slowly declining since his recent second Covid-19 vaccination on September 2021.  He was seen by his PCP recently on 11/20/2019 and given treatment with azithromycin, Levaquin and prednisone.  He continues to slowly decline with associated mental status changes, weakness.  No other recent changes in his medications or sick contacts.    PT Comments    Pt agreeable to attempt PT but unsure due to discomfort from catheter; pt attempted OOB standing activities (increased posterior lean) with increased discomfort and pt requested to terminate following 5 reps; pt agreeable to perform bed exercises when pain decreased, PT gave pt HEP and assessed ability to perform 2 reps of each to ensure performing correctly (knee to chest, abduction, SLR); pt continues to demonstrate deficits in balance, strength, coordination, gait and safety and will benefit from skilled PT to address deficits to maximize independence with functional mobility prior to discharge.    Follow Up Recommendations  Home health PT;Supervision/Assistance - 24 hour     Equipment Recommendations  Rolling walker with 5" wheels    Recommendations for Other Services       Precautions / Restrictions Precautions Precautions: Fall Restrictions Weight Bearing Restrictions: No Other Position/Activity Restrictions: new catheter    Mobility  Bed Mobility   Bed Mobility: Supine to  Sit;Sit to Supine     Supine to sit: HOB elevated;Supervision Sit to supine: Supervision      Transfers Overall transfer level: Needs assistance   Transfers: Sit to/from Stand Sit to Stand: Min assist         General transfer comment: performed 5 reps of sit<>stand with min A needed due to increased posterior lean; pt using bed at back of legs for stability, verbal cueing to come forward with pt demonstrating increased posterior lean; pt refused any additional OOB activities due to discomfort from catheter  Ambulation/Gait                 Stairs             Wheelchair Mobility    Modified Rankin (Stroke Patients Only)       Balance                                            Cognition Arousal/Alertness: Awake/alert Behavior During Therapy: WFL for tasks assessed/performed Overall Cognitive Status: Within Functional Limits for tasks assessed                                        Exercises      General Comments General comments (skin integrity, edema, etc.): HEP handout given knee to chest, abduction, SLR; pt performed 2 reps of each to demonstrate knowlege on how to perform correctly; pt stated he will do exercises once pain meds  kick in      Pertinent Vitals/Pain Pain Assessment: Faces Faces Pain Scale: Hurts little more Pain Location: pain due to catheter placement Pain Descriptors / Indicators: Burning    Home Living                      Prior Function            PT Goals (current goals can now be found in the care plan section) Acute Rehab PT Goals Patient Stated Goal: Hoping to get better and return home PT Goal Formulation: With patient Time For Goal Achievement: 12/13/19 Potential to Achieve Goals: Good Progress towards PT goals: Progressing toward goals    Frequency    Min 3X/week      PT Plan Current plan remains appropriate    Co-evaluation              AM-PAC PT "6  Clicks" Mobility   Outcome Measure  Help needed turning from your back to your side while in a flat bed without using bedrails?: A Little   Help needed moving to and from a bed to a chair (including a wheelchair)?: A Little Help needed standing up from a chair using your arms (e.g., wheelchair or bedside chair)?: A Little Help needed to walk in hospital room?: A Little Help needed climbing 3-5 steps with a railing? : A Lot 6 Click Score: 14    End of Session Equipment Utilized During Treatment: Gait belt Activity Tolerance: Patient limited by pain Patient left: in bed;with call bell/phone within reach;with bed alarm set Nurse Communication: Mobility status PT Visit Diagnosis: Unsteadiness on feet (R26.81);Muscle weakness (generalized) (M62.81)     Time: 3382-5053 PT Time Calculation (min) (ACUTE ONLY): 10 min  Charges:  $Therapeutic Exercise: 8-22 mins                     [name], DPT Acute Rehabilitation Services [phone]   [name] 12/02/2019, 1:56 PM

## 2019-12-02 NOTE — Progress Notes (Signed)
PT Cancellation Note  Patient Details Name: DOYEL MULKERN MRN: [id number] DOB: [birth year]   Cancelled Treatment:     pt just had catheter placed and asked therapist to return later  [name], DPT Acute Rehabilitation Services [phone]   [name] 12/02/2019, 11:55 AM

## 2019-12-03 ENCOUNTER — Ambulatory Visit (HOSPITAL_COMMUNITY): Payer: Medicare Other

## 2019-12-03 LAB — CULTURE, BLOOD (SINGLE)
Culture: NO GROWTH
Culture: NO GROWTH
Special Requests: ADEQUATE

## 2019-12-03 LAB — BASIC METABOLIC PANEL
Anion gap: 6 (ref 5–15)
BUN: 5 mg/dL — ABNORMAL LOW (ref 8–23)
CO2: 26 mmol/L (ref 22–32)
Calcium: 7.6 mg/dL — ABNORMAL LOW (ref 8.9–10.3)
Chloride: 108 mmol/L (ref 98–111)
Creatinine, Ser: 0.66 mg/dL (ref 0.61–1.24)
GFR, Estimated: >60 mL/min (ref >60)
Glucose, Bld: 105 mg/dL — ABNORMAL HIGH (ref 70–99)
Potassium: 3.6 mmol/L (ref 3.5–5.1)
Sodium: 140 mmol/L (ref 135–145)

## 2019-12-03 LAB — CBC
HCT: 27.8 % — ABNORMAL LOW (ref 39.0–52.0)
Hemoglobin: 8.9 g/dL — ABNORMAL LOW (ref 13.0–17.0)
MCH: 32.6 pg (ref 26.0–34.0)
MCHC: 32 g/dL (ref 30.0–36.0)
MCV: 101.8 fL — ABNORMAL HIGH (ref 80.0–100.0)
Platelets: 42 10*3/uL — ABNORMAL LOW (ref 150–400)
RBC: 2.73 MIL/uL — ABNORMAL LOW (ref 4.22–5.81)
RDW: 16.5 % — ABNORMAL HIGH (ref 11.5–15.5)
WBC: 2.4 10*3/uL — ABNORMAL LOW (ref 4.0–10.5)
nRBC: 0 % (ref 0.0–0.2)

## 2019-12-03 MED ORDER — SODIUM CHLORIDE 0.9 % IV SOLN
3.0000 g | Freq: Three times a day (TID) | INTRAVENOUS | Status: DC
  Administered 2019-12-03 – 2019-12-04 (×4): 3 g via INTRAVENOUS
  Filled 2019-12-03 (×2): qty 8
  Filled 2019-12-03: qty 3
  Filled 2019-12-03: qty 8
  Filled 2019-12-03: qty 3

## 2019-12-03 MED ORDER — VITAMIN B-12 1000 MCG PO TABS
1000.0000 ug | ORAL_TABLET | Freq: Every day | ORAL | Status: DC
  Administered 2019-12-03 – 2019-12-05 (×3): 1000 ug via ORAL
  Filled 2019-12-03 (×3): qty 1

## 2019-12-03 MED ORDER — POTASSIUM CHLORIDE CRYS ER 20 MEQ PO TBCR
40.0000 meq | EXTENDED_RELEASE_TABLET | Freq: Once | ORAL | Status: AC
  Administered 2019-12-03: 40 meq via ORAL
  Filled 2019-12-03: qty 2

## 2019-12-03 MED ORDER — POTASSIUM CHLORIDE CRYS ER 10 MEQ PO TBCR
EXTENDED_RELEASE_TABLET | ORAL | Status: AC
  Administered 2019-12-03: 40 meq via ORAL
  Filled 2019-12-03: qty 2

## 2019-12-03 MED ORDER — LOPERAMIDE HCL 2 MG PO CAPS
2.0000 mg | ORAL_CAPSULE | ORAL | Status: DC | PRN
  Administered 2019-12-03 – 2019-12-05 (×5): 2 mg via ORAL
  Filled 2019-12-03 (×5): qty 1

## 2019-12-03 MED ORDER — FAMOTIDINE 20 MG PO TABS
10.0000 mg | ORAL_TABLET | Freq: Once | ORAL | Status: AC | PRN
  Administered 2019-12-03: 10 mg via ORAL
  Filled 2019-12-03: qty 1

## 2019-12-03 NOTE — Progress Notes (Signed)
Pharmacy Antibiotic Note  Ryan Copeland is a 69 y.o. male admitted on 11/28/2019 with PNA with worsening symptoms despite outpatient course of antibiotics.  Pharmacy initially consulted for Vancomycin and Zosyn dosing.  MD wishes to change antibiotics to Unasyn  Plan: Unasyn 3gm IV q8 hours   Height: 6' (182.9 cm) Weight: 74.7 kg (164 lb 9.6 oz) IBW/kg (Calculated) : 77.6  Temp (24hrs), Avg:98.1 F (36.7 C), Min:97.8 F (36.6 C), Max:98.7 F (37.1 C)  Recent Labs  Lab 11/28/19 1131 11/28/19 1243 11/28/19 1846 11/28/19 2137 11/28/19 2304 11/29/19 0402 11/30/19 0443 12/01/19 0456 12/02/19 0500 12/03/19 0528  WBC   < >  --  3.6*  --    < > 3.2* 3.0* 2.0* 2.3* 2.4*  CREATININE   < >  --  0.96  --   --  0.92 1.02 0.94 0.77  --   LATICACIDVEN  --  1.7  --  0.9  --   --   --   --   --   --    < > = values in this interval not displayed.    Estimated Creatinine Clearance: 93.4 mL/min (by C-G formula based on SCr of 0.77 mg/dL).    Allergies  Allergen Reactions  . Privigen [Immune Globulin] Hives    Pt reports hives/rash on body with IV Privigen. He was changed to IV Gamunex-C and had many infusions without adverse side effects.  . Codeine Hives    Pt states he can take a few, more reaction with extended doses.    Antimicrobials this admission: Acyclovir PTA for px Vanc 10/14 >> 10/17 Zosyn 10/14 >>10/19 Unasyn  10/19>>  Dose adjustments this admission: n/a  Microbiology results: 10/14 BCx: ngtd 10/14 UCx: negative 10/14 resp panel: neg 10/14 strep pna neg 10/14 legionella: 10/16 MRSA PCR neg  Thank you for allowing pharmacy to be a part of this patient's care. Thanks for allowing pharmacy to be a part of this patient's care.  Excell Seltzer, PharmD Clinical Pharmacist 12/03/2019 7:04 AM

## 2019-12-03 NOTE — Progress Notes (Signed)
Physical Therapy Treatment Patient Details Name: Ryan Copeland MRN: [id number] DOB: 1951/02/10 Today's Date: 12/03/2019    History of Present Illness Ryan Copeland is a 69 y.o. 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  56 of HPI obtained from patient spouse, ED physician, EMS reports due to patient's altered mental status and inability to participate in HPI.  Apparently, patient has been slowly declining since his recent second Covid-19 vaccination on September 2021.  He was seen by his PCP recently on 11/20/2019 and given treatment with azithromycin, Levaquin and prednisone.  He continues to slowly decline with associated mental status changes, weakness.  No other recent changes in his medications or sick contacts.    PT Comments    Pt and wife present during session; pt more motivated due to being told he would discharge soon; pt able to perform bed mobility mod I, performed sit<>stand with RW mod I, S without AD; PT recommending use of RW upon discharge with pt and wife receptive, PT discussed follow up PT with pt and wife stating they dont think he needs it, HEP handout with explanation and demonstration given; wife states she feels comfortable and will provide 24 S/assist upon discharge; Pt continues to demonstrate deficits in balance, strength, endurance and gait and will be followed while in the acute setting for PT to improve these deficits to maximize independence prior to discharge.     Follow Up Recommendations  No PT follow up;Supervision/Assistance - 24 hour     Equipment Recommendations  Rolling walker with 5" wheels    Recommendations for Other Services       Precautions / Restrictions Precautions Precautions: Fall Restrictions Weight Bearing Restrictions: No Other Position/Activity Restrictions:  new catheter    Mobility  Bed Mobility Overal bed mobility: Modified Independent Bed Mobility: Supine to Sit;Sit to Supine     Supine to sit: Modified independent (Device/Increase time) Sit to supine: Modified independent (Device/Increase time)   General bed mobility comments: pt recalled managemeng of catheter  Transfers Overall transfer level: Needs assistance Equipment used: None Transfers: Sit to/from Stand Sit to Stand: Supervision            Ambulation/Gait                 Stairs             Wheelchair Mobility    Modified Rankin (Stroke Patients Only)       Balance                                            Cognition                                              Exercises  HEP handout given with supported standing 4 way hip exercise; re educated on supine exercises with handout; education to wife regarding 2-3 x per day 10-20 reps with wife in agreement; increased time spent educating pt on slowly improving endurance and energy conservation, education as well on use of RW for safety to decrease fall risk, pt and wife in agreement  General Comments  education regarding catheter being a fall risk and need to place in locations to provide visual cues prior to standing, pt and wife in agreement      Pertinent Vitals/Pain      Home Living                      Prior Function            PT Goals (current goals can now be found in the care plan section) Acute Rehab PT Goals Patient Stated Goal: I want to go home tomorrow PT Goal Formulation: With patient Time For Goal Achievement: 12/13/19 Potential to Achieve Goals: Good Progress towards PT goals: Progressing toward goals    Frequency    Min 3X/week      PT Plan Current plan remains appropriate    Co-evaluation              AM-PAC PT "6 Clicks" Mobility   Outcome Measure  Help needed turning from your back to your side  while in a flat bed without using bedrails?: None Help needed moving from lying on your back to sitting on the side of a flat bed without using bedrails?: None Help needed moving to and from a bed to a chair (including a wheelchair)?: A Little Help needed standing up from a chair using your arms (e.g., wheelchair or bedside chair)?: None Help needed to walk in hospital room?: A Little Help needed climbing 3-5 steps with a railing? : A Little 6 Click Score: 21    End of Session Equipment Utilized During Treatment: Gait belt Activity Tolerance: Patient tolerated treatment well Patient left: in bed;with family/visitor present;with call bell/phone within reach Nurse Communication: Mobility status PT Visit Diagnosis: Unsteadiness on feet (R26.81);Muscle weakness (generalized) (M62.81)     Time: 2778-2423 PT Time Calculation (min) (ACUTE ONLY): 32 min  Charges:  $Therapeutic Exercise: 8-22 mins $Therapeutic Activity: 8-22 mins                     [name], DPT Acute Rehabilitation Services [phone]   [name] 12/03/2019, 2:41 PM

## 2019-12-03 NOTE — Progress Notes (Signed)
PROGRESS NOTE    Ryan Copeland  KKX:[id number] DOB: 1950-12-18 DOA: 11/28/2019 PCP: [name]., PA-C   Brief Narrative:  HPI on 11/28/2019 by Dr. Eric [place] Ryan Copeland is a 69 y.o. 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  32 of HPI obtained from patient spouse, ED physician, EMS reports due to patient's altered mental status and inability to participate in HPI.  Apparently, patient has been slowly declining since his recent second Covid-19 vaccination on September 2021.  He was seen by his PCP recently on 11/20/2019 and given treatment with azithromycin, Levaquin and prednisone.  He continues to slowly decline with associated mental status changes, weakness.  No other recent changes in his medications or sick contacts.  Interim history  Admitted with sepsis secondary to pneumonia and AMS. Patient now with urinary retention.  Assessment & Plan   Severe sepsis with acute hypoxic respiratory failure secondary to community-acquired pneumonia -Sepsis present on admission as patient was noted to be febrile with tachycardia.  Chest x-ray on admission showed extensive peripheral opacity in the left midlung/lung base consistent with community-acquired pneumonia.  Patient was also noted to have hypoxia with oxygen saturations of 82% on room air. -patient is also immunocompromised -Patient placed on supplemental oxygen along with IV fluids, vancomycin and Zosyn -Has been weaned to room air -Of note, patient failed outpatient treatment with azithromycin and Levaquin -COVID-19 PCR as well as rapid influenza negative -Strep pneumonia urine antigen negative, pending Legionella urine antigen -Blood cultures show no growth to date -UA unremarkable for infection; Urine culture showed no growth -Procalcitonin 2.21 -CTA  chest negative for PE. Bilateral areas of lung consolidation consistent with multifocal pneumonia, areas are nodular to mass-like suspected to be infectious in etiology. Repeat CT recommended after therapy and symptom improvement.  -MRSA swab negative, discontinued vancomycin -De-escalated antibiotics, placed on Unasyn, which should allow for easy transition to oral antibiotics on discharge -Continue Tessalon Perles, hycodan for productive cough  Acute metabolic encephalopathy -Secondary to the above -Improved, AAO x3 -TSH WNL, HIV and RPR nonreactive.  Vitamin B12 106 -CT head: no acute intracranial pathology.  -Patient did require several doses of Haldol and was placed in four-point restraints -continue to monitor closely and treat underlying conditions  Vitamin B 12 deficiency -Given cyanocobalamin injections, will place patient on oral B12 supplementation  CLL, small B-cell type -Patient follows with Dr. Alvy Bimler and currently on chemotherapy Calquence -Chemo currently on hold -Continue acyclovir -Discussed with Dr. Alvy Bimler, patient is doing well with his treatment and is close to remission. Do not resume chemo until completion of antibiotics! -WBC stable -platelets have dropped to 33.  Discontinued lovenox. Will order SCDs placement. Likely secondary to antibiotics. Would not transfuse until below 10. -Platelets today 42 -Continue to monitor CBC  COPD -On examination, no wheezing appreciated -Patient being treated for pneumonia with acute hypoxic respiratory failure -Continue Breo Ellipta and nebulizer treatments as needed  Peripheral neuropathy -Patient does follow with a neurologist as an outpatient -Continue Lyrica  Physical deconditioning and debility -PT recommended HH -OT - no further needs  Acute Urinary retention -Patient feels that he has urinary frequency and is unable to urinate  -UA and culture are unremarkable for infection -Patient with multiple bladder  scan showing retention -We will continue to BladderScan every 4 hours, if needed conduct in/out -Lidocaine  gel has been ordered -Continue Flomax -Renal US: Suspected BPH, ?chronic bladder outlet obstruction. No hydronephrosis -Monitor intake and output -Urology consulted and appreciated.  Foley catheter placed.  Patient will need to follow-up with urology as an outpatient.  Per urology, ensure that patient is having adequate bowel movements and possibly try voiding trial in 7 days.  Hypokalemia -Resolved with replacement, continue to monitor and replace as needed -Magnesium 1.9  Chronic back pain -Continue hydrocodone  Hypotension -Patient currently asymptomatic and tells me he has low blood pressure all the time -Continue to monitor  Anemia -hemoglobin down to 8.8 on 10/17. Continues to be 8.9 today -Anemia panel shows iron 20, Ferritin 249 -Suspect drop secondary to bone marrow suppression from chemo -FOBT pending  -Pending CBC today  History of Tobacco abuse -Patient states he has not smoked since 2018  DVT Prophylaxis SCDs  Code Status: Full  Family Communication: None at bedside. Wife via phone.  Disposition Plan:  Status is: Inpatient  Remains inpatient appropriate because:Altered mental status, IV treatments appropriate due to intensity of illness or inability to take PO and Inpatient level of care appropriate due to severity of illness   Dispo: The patient is from: Home              Anticipated d/c is to: Home              Anticipated d/c date is: 3 days              Patient currently is not medically stable to d/c.   Consultants  Dr. Alvy Bimler, oncology-not officially consulted Urology  Procedures  Renal US  Antibiotics   Anti-infectives (From admission, onward)   Start     Dose/Rate Route Frequency Ordered Stop   11/29/19 0230  vancomycin (VANCOCIN) IVPB 1000 mg/200 mL premix  Status:  Discontinued        1,000 mg 200 mL/hr over 60 Minutes Intravenous  Every 12 hours 11/28/19 1325 12/01/19 0810   11/28/19 2200  acyclovir (ZOVIRAX) tablet 400 mg        400 mg Oral 2 times daily 11/28/19 1846     11/28/19 2130  piperacillin-tazobactam (ZOSYN) IVPB 3.375 g        3.375 g 12.5 mL/hr over 240 Minutes Intravenous Every 8 hours 11/28/19 1325     11/28/19 1300  vancomycin (VANCOREADY) IVPB 1500 mg/300 mL        1,500 mg 150 mL/hr over 120 Minutes Intravenous  Once 11/28/19 1249 11/28/19 1844   11/28/19 1245  piperacillin-tazobactam (ZOSYN) IVPB 3.375 g        3.375 g 100 mL/hr over 30 Minutes Intravenous  Once 11/28/19 1238 11/28/19 1407   11/28/19 1245  vancomycin (VANCOCIN) IVPB 1000 mg/200 mL premix  Status:  Discontinued        1,000 mg 200 mL/hr over 60 Minutes Intravenous  Once 11/28/19 1238 11/28/19 1249      Subjective:   Andris Baumann seen and examined today.  Patient complains of not obtaining his pain medications yesterday evening.  Denies shortness of breath, chest pain, abdominal pain, nausea or vomiting, dizziness or headache.   Objective:   Vitals:   12/02/19 2005 12/03/19 0029 12/03/19 0125 12/03/19 0550  BP: (!) 98/59 (!) 83/52 (!) 101/53 95/60  Pulse:      Resp: 16 16 17 20   Temp: 97.8 F (36.6 C) 98.7 F (37.1 C)  98 F (36.7 C)  TempSrc: Oral  Oral   SpO2:  93%  93%  Weight:    74.7 kg  Height:        Intake/Output Summary (Last 24 hours) at 12/03/2019 0659 Last data filed at 12/03/2019 0557 Gross per 24 hour  Intake 339.27 ml  Output 3775 ml  Net -3435.73 ml   Filed Weights   11/30/19 0604 12/01/19 0522 12/03/19 0550  Weight: 72.8 kg 74 kg 74.7 kg   Exam  General: Well developed, chronically ill-appearing, NAD  HEENT: NCAT, mucous membranes moist.   Cardiovascular: S1 S2 auscultated, RRR  Respiratory: Diminished breath sounds, no wheezing.  Productive cough  Abdomen: Soft, nontender, nondistended, + bowel sounds  Extremities: warm dry without cyanosis clubbing or edema  Neuro: AAOx3,  nonfocal  Psych: appropriate mood and affect   Data Reviewed: I have personally reviewed following labs and imaging studies  CBC: Recent Labs  Lab 11/28/19 1131 11/28/19 1846 11/28/19 2304 11/29/19 0402 11/30/19 0443 12/01/19 0456 12/02/19 0500  WBC 3.7*   < > 3.0* 3.2* 3.0* 2.0* 2.3*  NEUTROABS 2.4  --   --   --   --   --   --   HGB 12.3*   < > 11.5* 11.5* 10.9* 8.8* 8.8*  HCT 38.6*   < > 35.4* 34.0* 33.5* 26.7* 27.0*  MCV 102.4*   < > 99.7 99.7 100.6* 102.7* 102.3*  PLT PLATELET CLUMPS NOTED ON SMEAR, COUNT APPEARS DECREASED   < > 53* 52* 47* 39* 33*   < > = values in this interval not displayed.   Basic Metabolic Panel: Recent Labs  Lab 11/28/19 1131 11/28/19 1131 11/28/19 1846 11/29/19 0402 11/30/19 0443 12/01/19 0456 12/02/19 0500  NA 139  --   --  140 142 141 138  K 3.5  --   --  3.2* 2.8* 3.9 3.8  CL 105  --   --  106 105 109 107  CO2 26  --   --  24 26 24 27   GLUCOSE 76  --   --  92 107* 97 102*  BUN 16  --   --  16 11 11 9   CREATININE 1.01   < > 0.96 0.92 1.02 0.94 0.77  CALCIUM 8.3*  --   --  8.2* 8.2* 7.5* 7.6*  MG  --   --   --   --  1.7 1.9  --    < > = values in this interval not displayed.   GFR: Estimated Creatinine Clearance: 93.4 mL/min (by C-G formula based on SCr of 0.77 mg/dL). Liver Function Tests: Recent Labs  Lab 11/28/19 1131 11/29/19 0402 11/30/19 0443  AST 20 31 24   ALT 15 18 18   ALKPHOS 54 47 47  BILITOT 0.6 1.4* 1.5*  PROT 4.8* 4.4* 4.6*  ALBUMIN 2.8* 2.5* 2.5*   No results for input(s): LIPASE, AMYLASE in the last 168 hours. No results for input(s): AMMONIA in the last 168 hours. Coagulation Profile: Recent Labs  Lab 11/28/19 1131 11/29/19 0402  INR 1.2 1.4*   Cardiac Enzymes: No results for input(s): CKTOTAL, CKMB, CKMBINDEX, TROPONINI in the last 168 hours. BNP (last 3 results) No results for input(s): PROBNP in the last 8760 hours. HbA1C: No results for input(s): HGBA1C in the last 72 hours. CBG: Recent Labs   Lab 11/30/19 1201 11/30/19 1636 11/30/19 2125 12/01/19 0604 12/01/19 1205  GLUCAP 138* 120* 90 96 119*   Lipid Profile: No results for input(s): CHOL, HDL, LDLCALC, TRIG, CHOLHDL, LDLDIRECT in the last 72 hours. Thyroid  Function Tests: No results for input(s): TSH, T4TOTAL, FREET4, T3FREE, THYROIDAB in the last 72 hours. Anemia Panel: Recent Labs    12/01/19 1842 12/01/19 1843  VITAMINB12 1,605*  --   FOLATE 23.2  --   FERRITIN 249  --   TIBC 150*  --   IRON 20*  --   RETICCTPCT  --  0.9   Urine analysis:    Component Value Date/Time   COLORURINE YELLOW 11/28/2019 1127   APPEARANCEUR CLEAR 11/28/2019 1127   LABSPEC 1.010 11/28/2019 1127   PHURINE 5.0 11/28/2019 1127   GLUCOSEU NEGATIVE 11/28/2019 1127   HGBUR NEGATIVE 11/28/2019 1127   [place] 11/28/2019 1127   KETONESUR NEGATIVE 11/28/2019 1127   PROTEINUR NEGATIVE 11/28/2019 1127   UROBILINOGEN 0.2 02/03/2014 1356   NITRITE NEGATIVE 11/28/2019 1127   LEUKOCYTESUR NEGATIVE 11/28/2019 1127   Sepsis Labs: @LABRCNTIP (procalcitonin:4,lacticidven:4)  ) Recent Results (from the past 240 hour(s))  Respiratory Panel by RT PCR (Flu A&B, Covid) -     Status: None   Collection Time: 11/28/19 11:27 AM   Specimen: Nasopharyngeal  Result Value Ref Range Status   SARS Coronavirus 2 by RT PCR NEGATIVE NEGATIVE Final    Comment: (NOTE) SARS-CoV-2 target nucleic acids are NOT DETECTED.  The SARS-CoV-2 RNA is generally detectable in upper respiratoy specimens during the acute phase of infection. The lowest concentration of SARS-CoV-2 viral copies this assay can detect is 131 copies/mL. A negative result does not preclude SARS-Cov-2 infection and should not be used as the sole basis for treatment or other patient management decisions. A negative result may occur with  improper specimen collection/handling, submission of specimen other than nasopharyngeal swab, presence of viral mutation(s) within the areas  targeted by this assay, and inadequate number of viral copies (<131 copies/mL). A negative result must be combined with clinical observations, patient history, and epidemiological information. The expected result is Negative.  Fact Sheet for Patients:  PinkCheek.be  Fact Sheet for Healthcare Providers:  GravelBags.it  This test is no t yet approved or cleared by the [place] FDA and  has been authorized for detection and/or diagnosis of SARS-CoV-2 by FDA under an Emergency Use Authorization (EUA). This EUA will remain  in effect (meaning this test can be used) for the duration of the COVID-19 declaration under Section 564(b)(1) of the Act, 21 U.S.C. section 360bbb-3(b)(1), unless the authorization is terminated or revoked sooner.     Influenza A by PCR NEGATIVE NEGATIVE Final   Influenza B by PCR NEGATIVE NEGATIVE Final    Comment: (NOTE) The Xpert Xpress SARS-CoV-2/FLU/RSV assay is intended as an aid in  the diagnosis of influenza from Nasopharyngeal swab specimens and  should not be used as a sole basis for treatment. Nasal washings and  aspirates are unacceptable for Xpert Xpress SARS-CoV-2/FLU/RSV  testing.  Fact Sheet for Patients: PinkCheek.be  Fact Sheet for Healthcare Providers: GravelBags.it  This test is not yet approved or cleared by the [place] FDA and  has been authorized for detection and/or diagnosis of SARS-CoV-2 by  FDA under an Emergency Use Authorization (EUA). This EUA will remain  in effect (meaning this test can be used) for the duration of the  Covid-19 declaration under Section 564(b)(1) of the Act, 21  U.S.C. section 360bbb-3(b)(1), unless the authorization is  terminated or revoked. Performed at [facility], [place] [address], [place], [place] [address]   Blood culture (routine single)     Status: None (Preliminary  result)  Collection Time: 11/28/19 12:00 PM   Specimen: BLOOD RIGHT ARM  Result Value Ref Range Status   Specimen Description BLOOD RIGHT ARM  Final   Special Requests   Final    BOTTLES DRAWN AEROBIC AND ANAEROBIC Blood Culture adequate volume   Culture   Final    NO GROWTH 4 DAYS Performed at [facility], [address], [place], [place] [address]    Report Status PENDING  Incomplete  Urine culture     Status: None   Collection Time: 11/28/19  7:16 PM   Specimen: In/Out Cath Urine  Result Value Ref Range Status   Specimen Description IN/OUT CATH URINE  Final   Special Requests NONE  Final   Culture   Final    NO GROWTH Performed at [facility], [place] [address] [place], [place] [address]    Report Status 11/30/2019 FINAL  Final  Culture, blood (single)     Status: None (Preliminary result)   Collection Time: 11/28/19  9:38 PM   Specimen: BLOOD  Result Value Ref Range Status   Specimen Description BLOOD LEFT ANTECUBITAL  Final   Special Requests   Final    BOTTLES DRAWN AEROBIC AND ANAEROBIC Blood Culture results may not be optimal due to an excessive volume of blood received in culture bottles   Culture   Final    NO GROWTH 4 DAYS Performed at [facility], [place] [address] [place], [place] [address]    Report Status PENDING  Incomplete  MRSA PCR Screening     Status: None   Collection Time: 11/30/19  8:38 AM   Specimen: Nasopharyngeal  Result Value Ref Range Status   MRSA by PCR NEGATIVE NEGATIVE Final    Comment:        The GeneXpert MRSA Assay (FDA approved for NASAL specimens only), is one component of a comprehensive MRSA colonization surveillance program. It is not intended to diagnose MRSA infection nor to guide or monitor treatment for MRSA infections. Performed at [facility], [place] [address] [place], [place] [address]       Radiology Studies: US RENAL  Result Date: 12/01/2019 CLINICAL DATA:  Urinary retention EXAM: RENAL  / URINARY TRACT ULTRASOUND COMPLETE COMPARISON:  CT abdomen/pelvis dated 11/29/2018 FINDINGS: Right Kidney: Renal measurements: 10.4 x 5.3 x 4.9 cm = volume: 142.5 mL. Echogenicity within normal limits. No mass or hydronephrosis visualized. Left Kidney: Renal measurements: 11.9 x 4.7 x 5.6 cm = volume: 163.3 mL. Echogenicity within normal limits. No mass or hydronephrosis visualized. Bladder: Appears normal for degree of bladder distention. Other: Splenomegaly, better evaluated on prior CT. Prostate indents the base of the bladder, suggesting BPH. IMPRESSION: Suspected BPH. This may account for the patient's urinary retention, on the basis of chronic bladder outlet obstruction. No hydronephrosis. Splenomegaly, previously noted on CT. Electronically Signed   By: Julian Hy M.D.   On: 12/01/2019 11:52     Scheduled Meds: . acyclovir  400 mg Oral BID  . allopurinol  300 mg Oral Daily  . B-complex with vitamin C  1 tablet Oral Daily  . Chlorhexidine Gluconate Cloth  6 each Topical Daily  . cholecalciferol  1,000 Units Oral Daily  . fluticasone  2 spray Each Nare Daily  . fluticasone furoate-vilanterol  1 puff Inhalation Daily  . montelukast  10 mg Oral QHS  . pregabalin  200 mg Oral TID  . rosuvastatin  5 mg Oral Daily  . sodium chloride  flush  10-40 mL Intracatheter Q12H  . tamsulosin  0.4 mg Oral Daily   Continuous Infusions: . sodium chloride 1,000 mL (11/30/19 0838)  . piperacillin-tazobactam (ZOSYN)  IV 3.375 g (12/03/19 0554)     LOS: 5 days   Time Spent in minutes   30 minutes  Cylie Dor D.O. on 12/03/2019 at 6:59 AM  Between 7am to 7pm - Please see pager noted on [email or web address]  After 7pm go to [email or web address]  And look for the night coverage person covering for me after hours  [facility] Office  [phone]

## 2019-12-03 NOTE — Care Management Important Message (Signed)
Important Message  Patient Details  Name: Ryan Copeland MRN: [id number] Date of Birth: 06-15-1950   Medicare Important Message Given:  Yes     [name] 12/03/2019, 10:44 AM

## 2019-12-03 NOTE — Plan of Care (Signed)
  Problem: Education: Goal: Ability to describe self-care measures that may prevent or decrease complications (Diabetes Survival Skills Education) will improve Outcome: Progressing   Problem: Coping: Goal: Ability to adjust to condition or change in health will improve Outcome: Progressing   Problem: Fluid Volume: Goal: Ability to maintain a balanced intake and output will improve Outcome: Progressing

## 2019-12-03 NOTE — Progress Notes (Signed)
PT Cancellation Note  Patient Details Name: Ryan Copeland MRN: [id number] DOB: 1950-08-31   Cancelled Treatment:     pt refused PT due to diarrhea. Requested to attempt after lunch; will attempt time permitting [name], DPT Acute Rehabilitation Services [phone]     [name] 12/03/2019, 12:16 PM

## 2019-12-03 NOTE — TOC Initial Note (Signed)
Transition of Care [facility]) - Initial/Assessment Note    Patient Details  Name: DELDRICK Copeland MRN: [id number] Date of Birth: [birth year]  Transition of Care [facility] CM/SW Contact:    Ryan Mayo, RN Phone Number: 12/03/2019, 3:57 PM  Clinical Narrative:                 NCM spoke with patient at bedside, he lives with wife, he states he will need a rolling walker and may be for possible dc tomorrow.  He has transportation at dc and no issues with medications.  NCM made referral to [facility] with [facility], the rolling walker will be brought to patient 's room at dc.   Expected Discharge Plan: Home/Self Care Barriers to Discharge: Continued Medical Work up   Patient Goals and CMS Choice Patient states their goals for this hospitalization and ongoing recovery are:: to get better   Choice offered to / list presented to : NA  Expected Discharge Plan and Services Expected Discharge Plan: Home/Self Care In-house Referral: NA Discharge Planning Services: CM Consult Post Acute Care Choice: NA Living arrangements for the past 2 months: Single Family Home                 DME Arranged: Walker rolling DME Agency: [employer] Date DME Agency Contacted: 12/03/19 Time DME Agency Contacted: 95 Representative spoke with at DME Agency: [place] Arranged: NA          Prior Living Arrangements/Services Living arrangements for the past 2 months: Single Family Home Lives with:: Spouse Patient language and need for interpreter reviewed:: Yes Do you feel safe going back to the place where you live?: Yes      Need for Family Participation in Patient Care: Yes (Comment) Care giver support system in place?: Yes (comment)   Criminal Activity/Legal Involvement Pertinent to Current Situation/Hospitalization: No - Comment as needed  Activities of Daily Living      Permission Sought/Granted                  Emotional Assessment Appearance:: Appears stated  age Attitude/Demeanor/Rapport: Engaged Affect (typically observed): Appropriate Orientation: : Oriented to  Time, Oriented to Situation, Oriented to Place, Oriented to Self Alcohol / Substance Use: Not Applicable Psych Involvement: No (comment)  Admission diagnosis:  Community acquired bacterial pneumonia [J15.9] Patient Active Problem List   Diagnosis Date Noted  . Community acquired bacterial pneumonia 11/28/2019  . Cerebral vascular disease 11/25/2019  . CIDP (chronic inflammatory demyelinating polyneuropathy) ([place]) 11/25/2019  . Other fatigue 10/25/2019  . Deficiency anemia 10/25/2019  . Dizziness 10/22/2019  . Gait abnormality 10/22/2019  . Hx of nonmelanoma skin cancer 03/15/2019  . Essential hypertension 04/24/2018  . Influenza A 04/17/2018  . Sepsis ([place]) 04/17/2018  . Chronic diastolic CHF (congestive heart failure) ([place]) 04/17/2018  . Skin rash 08/07/2017  . Port-A-Cath in place 08/04/2017  . 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 Retained dental roots 04/26/2017  . Dental caries 04/26/2017  . 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 Loose, teeth 04/26/2017  . Peripheral polyneuropathy 04/14/2017  . COPD mixed type ([place]) 02/23/2017  . Chronic sinusitis 01/27/2017  . Splenomegaly, congestive, chronic 12/29/2016  . Diarrhea 12/01/2016  . Poor dentition 12/01/2016  . Benign mole 12/01/2016  . Pancytopenia, acquired ([place]) 11/03/2016  . Odynophagia 01/15/2016  . Polycythemia, secondary 06/26/2015  . Quality of life palliative care encounter 06/26/2015  . Atherosclerosis of extremity with intermittent claudication ([place]) 01/23/2015  . Lateral epicondylitis of left elbow   .  ECRB (extensor carpi radialis brevis) tenosynovitis   . Anxiety 01/30/2014  . Preoperative clearance 01/30/2014  . Thrombocytopenia ([place]) 01/30/2014  . Diabetes mellitus without complication ([place]) 33/82/5053  . Hypogammaglobulinemia, acquired ([place]) 12/26/2012  . Hereditary and idiopathic  peripheral neuropathy 08/20/2012  . 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[place]) 08/20/2012  . CAD (coronary artery disease) 07/29/2011  . 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 MI, acute, non ST segment elevation ([place])   . Hyperlipidemia   . PVD (peripheral vascular disease) ([place])   . GERD 09/27/2008  . SHOULDER STRAIN, RIGHT 09/27/2008   PCP:  [name]., PA-C Pharmacy:   [id number] - [place], [place] [address] Phone: [phone] Fax: [place], [place] [address] Phone: [phone] Fax: [place], [place] - [place] [address] [address] Phone: [phone] Fax: [phone]     Social Determinants of Health (SDOH) Interventions Food Insecurity Interventions: Intervention Not Indicated Transportation Interventions: Intervention Not Indicated  Readmission Risk Interventions Readmission Risk Prevention Plan 12/03/2019  Transportation Screening Complete  Medication Review ([place]) Complete  PCP or Specialist appointment within 3-5 days of discharge Complete  HRI or [place] Complete  SW Recovery Care/Counseling Consult Complete  [place] Not Applicable  Some recent data might be hidden

## 2019-12-04 DIAGNOSIS — D696 Thrombocytopenia, unspecified: Secondary | ICD-10-CM

## 2019-12-04 LAB — CBC
HCT: 30.3 % — ABNORMAL LOW (ref 39.0–52.0)
Hemoglobin: 9.7 g/dL — ABNORMAL LOW (ref 13.0–17.0)
MCH: 32.6 pg (ref 26.0–34.0)
MCHC: 32 g/dL (ref 30.0–36.0)
MCV: 101.7 fL — ABNORMAL HIGH (ref 80.0–100.0)
Platelets: 54 10*3/uL — ABNORMAL LOW (ref 150–400)
RBC: 2.98 MIL/uL — ABNORMAL LOW (ref 4.22–5.81)
RDW: 16.3 % — ABNORMAL HIGH (ref 11.5–15.5)
WBC: 1.8 10*3/uL — ABNORMAL LOW (ref 4.0–10.5)
nRBC: 0 % (ref 0.0–0.2)

## 2019-12-04 LAB — BASIC METABOLIC PANEL
Anion gap: 7 (ref 5–15)
BUN: <5 mg/dL — ABNORMAL LOW (ref 8–23)
CO2: 25 mmol/L (ref 22–32)
Calcium: 7.9 mg/dL — ABNORMAL LOW (ref 8.9–10.3)
Chloride: 108 mmol/L (ref 98–111)
Creatinine, Ser: 0.65 mg/dL (ref 0.61–1.24)
GFR, Estimated: >60 mL/min (ref >60)
Glucose, Bld: 93 mg/dL (ref 70–99)
Potassium: 3.8 mmol/L (ref 3.5–5.1)
Sodium: 140 mmol/L (ref 135–145)

## 2019-12-04 MED ORDER — BENZONATATE 100 MG PO CAPS: 200.0000 mg | ORAL_CAPSULE | Freq: Three times a day (TID) | ORAL | Status: DC | PRN

## 2019-12-04 MED ORDER — AMOXICILLIN-POT CLAVULANATE 875-125 MG PO TABS
1.0000 | ORAL_TABLET | Freq: Two times a day (BID) | ORAL | Status: AC
  Administered 2019-12-04 (×2): 1 via ORAL
  Filled 2019-12-04 (×2): qty 1

## 2019-12-04 MED ORDER — HYDROCODONE-HOMATROPINE 5-1.5 MG/5ML PO SYRP: 5.0000 mL | ORAL_SOLUTION | Freq: Four times a day (QID) | ORAL | Status: DC | PRN

## 2019-12-04 NOTE — Plan of Care (Signed)
  Problem: Coping: Goal: Level of anxiety will decrease Outcome: Progressing   Problem: Elimination: Goal: Will not experience complications related to bowel motility Outcome: Progressing   Problem: Pain Managment: Goal: General experience of comfort will improve Outcome: Progressing   Problem: Safety: Goal: Ability to remain free from injury will improve Outcome: Progressing   

## 2019-12-04 NOTE — Progress Notes (Deleted)
CARDIOLOGY OFFICE NOTE  Date:  12/04/2019    Ryan Copeland Date of Birth: 1950-06-12 Medical Record #[id number]  PCP:  [name]., PA-C  Cardiologist:  Servando Snare & ***    No chief complaint on file.   History of Present Illness: Ryan Copeland is a 69 y.o. male who presents today for a ***  Seen for Dr. Acie Fredrickson. Former patient of Dr. Susa Simmonds.Primarily follows with me.  Has known prior NSTEMI and stenting of the LAD in 2011. Underwent repeat cath due to refractory symptoms back in August of 2016 - managed medically. Other issues include tobacco abuse, PVD with past iliac stenting(previously followed at [facility]), HTN, HLD, DM, chronic thrombocytopenia and CLL- small cell B cell lymphoma. He sees neurology for his peripheral neuropathy.  Seen back in Septemberof 2017- had gained weight. Some intermittent chest heaviness - no response with NTG but I added Imdur to his regimen.When seenback in October - still with atypical chest pain - felt more like a pulled muscle but no relief with NSAID. He thought the Imdur had helped however. I talked with Dr. Acie Fredrickson - wanted to avoid Myoview - most likely it was felt to turn out abnormal. Elected to just follow for now.Ended uphaving GI work upinDecemberof 2017- this was felt to be possibly causing some of his prior chest pain -turned out to be a yeast infection and with treatment his chest pain resolved. We got him offImdur so he could resume Viagra. Still struggling with trying to stop smoking.He did have to go back on treatment for his CLL in 2018.   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 Hadsome swelling of his lower legs ata pastvisit with me- not short of breath. I put him on very low dose prn Lasix. Not smoking. No chest pain. Cardiac statuswasfelt  to be stable.  I last saw him by a telehealth visit in April -he was doing well. He had been admitted in March with flu A. Metoprolol was to be cut back - he did not do that. Using Lasix just prn. Otherwise was stable from our standpoint.We last saw him back in October - he had retired. Losing weight intentionally. Little lightheaded at times. He felt like he was doing well.    The patient does not have symptoms concerning for COVID-19 infection (fever, chills, cough, or new shortness of breath).   Comes in today. Here alone. Complains of cramps - in his legs at night. Has in his arms in the evenings. No chest pain. He will note some soreness if he mashes on his chest - nothing exertional. His breathing is stable. He has COPD - has upcoming PFTs with pulmonary. Chronic cough. He has chronic bruising - platelets chronically low but stable. Does endorse dizziness - no syncope. BP good but low normal - typically does not use salt.    Comes in today. Here with   Past Medical History:  Diagnosis Date  . Anxiety 01/30/2014  . Back injury    lower disc  . CAD (coronary artery disease)   . CLL (chronic lymphocytic leukemia) ([place]) 03/18/2011  . COPD (chronic obstructive pulmonary disease) ([place])   . Diabetes mellitus ([place])    Type 2   . ECRB (extensor carpi radialis brevis) tenosynovitis   . GERD (gastroesophageal reflux disease)    takes Nexium if needed  .  Hyperlipidemia   . Lateral epicondylitis of left elbow   . MI, acute, non ST segment elevation ([place]) 06/28/2009   with stenting of the LAD  . Neuromuscular disorder ([place])    peripheral neuropathy  . PVD (peripheral vascular disease) ([place])   . Tobacco abuse     Past Surgical History:  Procedure Laterality Date  . ADENOIDECTOMY  1955  . CARDIAC CATHETERIZATION    . CARDIAC CATHETERIZATION N/A 09/26/2014   Procedure: Left Heart Cath and Coronary Angiography;  Surgeon: Peter M Martinique, MD;  Location: [facility];  Service:  Cardiovascular;  Laterality: N/A;  . carpel tunnel release Left 04-1989  . carpel tunnel release  Right 01-1989  . CHOLECYSTECTOMY  2007  . CORONARY STENT PLACEMENT  May 2011  . femoral stents    . IR CV LINE INJECTION  08/18/2017  . IR CV LINE INJECTION  09/01/2017  . IR CV LINE INJECTION  02/02/2018  . LATERAL EPICONDYLE RELEASE Left 02/12/2014   Procedure: LEFT ELBOW DEBRIDEMENT WITH TENDON REPAIR ;  Surgeon: Lorn Junes, MD;  Location: [facility];  Service: Orthopedics;  Laterality: Left;  . LEFT CAI STENT/PTA AND POPLITEAL ARTERY/TIBIAL THROMBECTOMY     . LEFT HEART CATHETERIZATION WITH CORONARY ANGIOGRAM N/A 08/26/2011   Procedure: LEFT HEART CATHETERIZATION WITH CORONARY ANGIOGRAM;  Surgeon: Peter M Martinique, MD;  Location: [facility];  Service: Cardiovascular;  Laterality: N/A;  . PERIPHERAL VASCULAR CATHETERIZATION N/A 01/01/2015   Procedure: Abdominal Aortogram;  Surgeon: Conrad Nashwauk, MD;  Location: [facility];  Service: Cardiovascular;  Laterality: N/A;  . TARSAL TUNNEL RELEASE Bilateral 08-2007     Medications: No outpatient medications have been marked as taking for the 12/18/19 encounter (Appointment) with Burtis Junes, NP.     Allergies: Allergies  Allergen Reactions  . Privigen [Immune Globulin] Hives    Pt reports hives/rash on body with IV Privigen. He was changed to IV Gamunex-C and had many infusions without adverse side effects.  . Codeine Hives    Pt states he can take a few, more reaction with extended doses.    Social History: The patient  reports that he quit smoking about 3 years ago. His smoking use included cigarettes. He has a 44.00 pack-year smoking history. He has quit using smokeless tobacco. He reports that he does not drink alcohol and does not use drugs.   Family History: The patient's ***family history includes Cancer in his mother; Heart disease in his father; Heart failure (age of onset: 46) in his father; Lung cancer (age of onset:  58) in his mother.   Review of Systems: Please see the history of present illness.   All other systems are reviewed and negative.   Physical Exam: VS:  There were no vitals taken for this visit. Marland Kitchen  BMI There is no height or weight on file to calculate BMI.  Wt Readings from Last 3 Encounters:  12/04/19 165 lb 3.2 oz (74.9 kg)  11/08/19 167 lb 6.4 oz (75.9 kg)  10/25/19 170 lb 12.8 oz (77.5 kg)    General: Pleasant. Well developed, well nourished and in no acute distress.   HEENT: Normal.  Neck: Supple, no JVD, carotid bruits, or masses noted.  Cardiac: ***Regular rate and rhythm. No murmurs, rubs, or gallops. No edema.  Respiratory:  Lungs are clear to auscultation bilaterally with normal work of breathing.  GI: Soft and nontender.  MS: No deformity or atrophy. Gait and ROM intact.  Skin:  Warm and dry. Color is normal.  Neuro:  Strength and sensation are intact and no gross focal deficits noted.  Psych: Alert, appropriate and with normal affect.   LABORATORY DATA:  EKG:  EKG {ACTION; IS/IS GMW:[id number]} ordered today.  Personally reviewed by me. This demonstrates ***.  Lab Results  Component Value Date   WBC 1.8 (L) 12/04/2019   HGB 9.7 (L) 12/04/2019   HCT 30.3 (L) 12/04/2019   PLT 54 (L) 12/04/2019   GLUCOSE 93 12/04/2019   CHOL 79 (L) 06/19/2019   TRIG 44 06/19/2019   HDL 34 (L) 06/19/2019   LDLDIRECT 117.6 08/22/2011   LDLCALC 33 06/19/2019   ALT 18 11/30/2019   AST 24 11/30/2019   NA 140 12/04/2019   K 3.8 12/04/2019   CL 108 12/04/2019   CREATININE 0.65 12/04/2019   BUN <5 (L) 12/04/2019   CO2 25 12/04/2019   TSH 1.020 11/28/2019   INR 1.4 (H) 11/29/2019   HGBA1C 5.1 11/28/2019     BNP (last 3 results) No results for input(s): BNP in the last 8760 hours.  ProBNP (last 3 results) No results for input(s): PROBNP in the last 8760 hours.   Other Studies Reviewed Today:   Assessment/Plan:  Coronary angiography from 09/2014:  Coronary dominance:  right  Left mainstem: Normal.  Left anterior descending (LAD): The stent in the proximal LAD is widely patent throughout. There is moderate 20% narrowing in the LAD proximal to the stent. The remainder of the vessels without significant disease.  Left circumflex (LCx): The left circumflex gives rise to 2 large marginal branches. There is 20% narrowing prior to the takeoff of the first OM.  Right coronary artery (RCA): The right coronary is a dominant vessel. It has diffuse 70% disease in the proximal to mid vessel. Is occluded at the crux. There are excellent right to right and left to right collaterals.  Left ventriculography: Left ventricular systolic function is normal, LVEF is estimated at 55-65%, there is no significant mitral regurgitation  Final Conclusions:  1. Single vessel obstructive coronary disease. 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 The stent in the proximal LAD is widely patent.  2. Normal LV function.  Recommendations: Continue medical management.  Peter Martinique  08/26/2011, 9:09 AM   ASSESSMENT & PLAN:   1. Cramps - will check potassium and mag level. Suggestions given.   2. CAD - stable cath from 2016 - has chronic total occlusion of RCA with good collateral flow - patent stent in proximal LAD. He has no worrisome symptoms. Would continue with CV risk factor modification.   3. HTN - BP is soft - not on any hypertensive medicine. Ok to liberalize his salt a bit.   4. HLD - checking lipids today.   5. COPD/past tobacco abuse - per pulmonary  6. CLL/chronic thrombocytopenia - followed by oncology/hematology.   7. NIDDM - per PCP  8. Obesity - continues to lose weight.   9. ED - on Viagra - not discussed.   10.PAD with priorL CIA PTA+S, L pop artery thrombectomy by Dr. Amedeo Plenty- has not seen [facility] since 01/2016 -he has not felt that he needed follow up. Denies claudication. Has stopped  smoking.   Current medicines are reviewed with the patient today.  The patient does not have concerns regarding medicines other than what has been noted above.  The following changes have been made:  See above.  Labs/ tests ordered today include:  No orders of the defined types were placed in this encounter.    Disposition:   FU with *** in {gen number [id number]} {Days to years:10300}.   Patient is agreeable to this plan and will call if any problems develop in the interim.   SignedTruitt Merle, NP  12/04/2019 7:54 AM  [facility] [address] [place], [place]  [address] Phone: [phone] Fax: [phone]

## 2019-12-04 NOTE — Progress Notes (Signed)
[facility] PROGRESS NOTE    Progress Note  Ryan Copeland  [name]:[id number] DOB: 1950-03-31 DOA: 11/28/2019 PCP: [name]., PA-C     Brief Narrative:   Ryan Copeland is an 69 y.o. 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  Assessment/Plan:   Severe sepsis with acute hypoxic respiratory failure secondary to community-acquired pneumonia: Patient was started on IV empirically vancomycin and Zosyn and has been weaned to room air.  Did fail outpatient treatment with azithromycin and Levaquin. Strep pneumo and Legionella are negative. Blood cultures have remained negative till date. Antibiotic regimen will transition to IV Unasyn. Due to his high risk of readmission, will change IV Unasyn to oral Augmentin continue to monitor fever curve and mental status. Physical therapy evaluation, check saturations with ambulation.  Acute metabolic encephalopathy: Likely secondary to infectious etiology. He did receive several doses of Haldol and was placed on four-point restraint. Still having hallucinations continue Haldol as needed.  Vitamin B12 deficiency: Continue injections daily for 7 days and transition to weekly and subsequently to monthly.  CLL B-cell lymphoma: Follow-up with Dr. Core surgery as an outpatient. Continue acyclovir prophylaxis.  Acute thrombocytopenia: With a drop to 33 Lovenox was held most recently SCDs, likely due to infectious etiology. Platelet counts are trending up third day in a row.  COPD: Continue inhalers stable.  Peripheral neuropathy: Follow-up with neurology as an outpatient, continue  Lyrica.  Physical deconditioning: Physical therapy recommended home health PT.  Acute urinary retention: Currently on Flomax renal ultrasound shows suspected BPH question chronic bladder obstruction no hydronephrosis. Urology was consulted who placed a Foley catheter and will need to follow-up with urology as an outpatient.  Hypokalemia: Replete orally now resolved.  Chronic back pain: Continue hydrocodone.  Hypotension: Currently asymptomatic blood pressure has been stable.  Continue to monitor closely.  Normocytic anemia likely due to chemotherapy: Hemoglobin is trending up we will continue to monitor intermittently.   DVT prophylaxis: lovenox Family Communication:none Status is: Inpatient  Remains inpatient appropriate because:Hemodynamically unstable   Dispo: The patient is from: Home              Anticipated d/c is to: Home              Anticipated d/c date is: 1 day              Patient currently is not medically stable to d/c.        Code Status:     Code Status Orders  (From admission, onward)         Start     Ordered   11/28/19 1846  Full code  Continuous        11/28/19 1846        Code Status History    Date Active Date Inactive Code Status Order ID Comments User Context   04/18/2018 0111 04/21/2018 1524 Full Code [id number]  Ivor Costa, MD ED   01/01/2015 1056 01/01/2015 1919 Full Code [id number]  Conrad Beardsley, MD Inpatient   09/26/2014 0927 09/26/2014 1527 Full Code [id number]  Martinique, Peter M, MD Inpatient   Advance Care Planning Activity  IV Access:    Peripheral IV   Procedures and diagnostic studies:   No results found.   Medical Consultants:    None.  Anti-Infectives:   augmentin  Subjective:    Ryan Copeland relates he feels better little bit short of breath with ambulation.  Objective:    Vitals:   12/03/19 2043 12/04/19 0112 12/04/19 0400 12/04/19 0809  BP: (!) 103/57 (!) 91/43    Pulse: 66     Resp:  (!) 9 15    Temp: 98.3 F (36.8 C) 98.1 F (36.7 C)    TempSrc: Oral     SpO2:  96%  97%  Weight:   74.9 kg   Height:       SpO2: 97 % O2 Flow Rate (L/min): 0 L/min   Intake/Output Summary (Last 24 hours) at 12/04/2019 1023 Last data filed at 12/04/2019 0850 Gross per 24 hour  Intake 1426.97 ml  Output 2700 ml  Net -1273.03 ml   Filed Weights   12/01/19 0522 12/03/19 0550 12/04/19 0400  Weight: 74 kg 74.7 kg 74.9 kg    Exam: General exam: In no acute distress. Respiratory system: Good air movement and clear to auscultation. Cardiovascular system: S1 & S2 heard, RRR. No JVD. Gastrointestinal system: Abdomen is nondistended, soft and nontender.  Extremities: No pedal edema. Skin: No rashes, lesions or ulcers  Data Reviewed:    Labs: Basic Metabolic Panel: Recent Labs  Lab 11/30/19 0443 11/30/19 0443 12/01/19 0456 12/01/19 0456 12/02/19 0500 12/02/19 0500 12/03/19 0528 12/04/19 0500  NA 142  --  141  --  138  --  140 140  K 2.8*   < > 3.9   < > 3.8   < > 3.6 3.8  CL 105  --  109  --  107  --  108 108  CO2 26  --  24  --  27  --  26 25  GLUCOSE 107*  --  97  --  102*  --  105* 93  BUN 11  --  11  --  9  --  5* <5*  CREATININE 1.02  --  0.94  --  0.77  --  0.66 0.65  CALCIUM 8.2*  --  7.5*  --  7.6*  --  7.6* 7.9*  MG 1.7  --  1.9  --   --   --   --   --    < > = values in this interval not displayed.   GFR Estimated Creatinine Clearance: 93.6 mL/min (by C-G formula based on SCr of 0.65 mg/dL). Liver Function Tests: Recent Labs  Lab 11/28/19 1131 11/29/19 0402 11/30/19 0443  AST 20 31 24   ALT 15 18 18   ALKPHOS 54 47 47  BILITOT 0.6 1.4* 1.5*  PROT 4.8* 4.4* 4.6*  ALBUMIN 2.8* 2.5* 2.5*   No results for input(s): LIPASE, AMYLASE in the last 168 hours. No results for input(s): AMMONIA in the last 168 hours. Coagulation profile Recent Labs  Lab 11/28/19 1131 11/29/19 0402  INR 1.2 1.4*   COVID-19 Labs  Recent Labs    12/01/19 1842   FERRITIN 249    Lab Results  Component Value Date   SARSCOV2NAA NEGATIVE 11/28/2019   [place] NEGATIVE 06/24/2019    CBC: Recent Labs  Lab 11/28/19 1131 11/28/19 1846 11/30/19 0443 12/01/19 0456 12/02/19 0500 12/03/19 0528 12/04/19 0500  WBC 3.7*   < > 3.0* 2.0* 2.3* 2.4* 1.8*  NEUTROABS 2.4  --   --   --   --   --   --  HGB 12.3*   < > 10.9* 8.8* 8.8* 8.9* 9.7*  HCT 38.6*   < > 33.5* 26.7* 27.0* 27.8* 30.3*  MCV 102.4*   < > 100.6* 102.7* 102.3* 101.8* 101.7*  PLT PLATELET CLUMPS NOTED ON SMEAR, COUNT APPEARS DECREASED   < > 47* 39* 33* 42* 54*   < > = values in this interval not displayed.   Cardiac Enzymes: No results for input(s): CKTOTAL, CKMB, CKMBINDEX, TROPONINI in the last 168 hours. BNP (last 3 results) No results for input(s): PROBNP in the last 8760 hours. CBG: Recent Labs  Lab 11/30/19 1201 11/30/19 1636 11/30/19 2125 12/01/19 0604 12/01/19 1205  GLUCAP 138* 120* 90 96 119*   D-Dimer: No results for input(s): DDIMER in the last 72 hours. Hgb A1c: No results for input(s): HGBA1C in the last 72 hours. Lipid Profile: No results for input(s): CHOL, HDL, LDLCALC, TRIG, CHOLHDL, LDLDIRECT in the last 72 hours. Thyroid function studies: No results for input(s): TSH, T4TOTAL, T3FREE, THYROIDAB in the last 72 hours.  Invalid input(s): FREET3 Anemia work up: Recent Labs    12/01/19 1842 12/01/19 1843  VITAMINB12 1,605*  --   FOLATE 23.2  --   FERRITIN 249  --   TIBC 150*  --   IRON 20*  --   RETICCTPCT  --  0.9   Sepsis Labs: Recent Labs  Lab 11/28/19 1131 11/28/19 1243 11/28/19 1846 11/28/19 2137 11/28/19 2304 11/29/19 0402 11/29/19 0402 11/30/19 0443 11/30/19 0443 12/01/19 0456 12/02/19 0500 12/03/19 0528 12/04/19 0500  PROCALCITON  --   --  2.51  --   --  2.21  --  1.47  --   --   --   --   --   WBC   < >  --  3.6*  --    < > 3.2*   < > 3.0*   < > 2.0* 2.3* 2.4* 1.8*  LATICACIDVEN  --  1.7  --  0.9  --   --   --   --   --    --   --   --   --    < > = values in this interval not displayed.   Microbiology Recent Results (from the past 240 hour(s))  Respiratory Panel by RT PCR (Flu A&B, Covid) -     Status: None   Collection Time: 11/28/19 11:27 AM   Specimen: Nasopharyngeal  Result Value Ref Range Status   SARS Coronavirus 2 by RT PCR NEGATIVE NEGATIVE Final    Comment: (NOTE) SARS-CoV-2 target nucleic acids are NOT DETECTED.  The SARS-CoV-2 RNA is generally detectable in upper respiratoy specimens during the acute phase of infection. The lowest concentration of SARS-CoV-2 viral copies this assay can detect is 131 copies/mL. A negative result does not preclude SARS-Cov-2 infection and should not be used as the sole basis for treatment or other patient management decisions. A negative result may occur with  improper specimen collection/handling, submission of specimen other than nasopharyngeal swab, presence of viral mutation(s) within the areas targeted by this assay, and inadequate number of viral copies (<131 copies/mL). A negative result must be combined with clinical observations, patient history, and epidemiological information. The expected result is Negative.  Fact Sheet for Patients:  PinkCheek.be  Fact Sheet for Healthcare Providers:  GravelBags.it  This test is no t yet approved or cleared by the [place] FDA and  has been authorized for detection and/or diagnosis of SARS-CoV-2 by FDA under an Emergency Use  Authorization (EUA). This EUA will remain  in effect (meaning this test can be used) for the duration of the COVID-19 declaration under Section 564(b)(1) of the Act, 21 U.S.C. section 360bbb-3(b)(1), unless the authorization is terminated or revoked sooner.     Influenza A by PCR NEGATIVE NEGATIVE Final   Influenza B by PCR NEGATIVE NEGATIVE Final    Comment: (NOTE) The Xpert Xpress SARS-CoV-2/FLU/RSV assay is intended  as an aid in  the diagnosis of influenza from Nasopharyngeal swab specimens and  should not be used as a sole basis for treatment. Nasal washings and  aspirates are unacceptable for Xpert Xpress SARS-CoV-2/FLU/RSV  testing.  Fact Sheet for Patients: PinkCheek.be  Fact Sheet for Healthcare Providers: GravelBags.it  This test is not yet approved or cleared by the [place] FDA and  has been authorized for detection and/or diagnosis of SARS-CoV-2 by  FDA under an Emergency Use Authorization (EUA). This EUA will remain  in effect (meaning this test can be used) for the duration of the  Covid-19 declaration under Section 564(b)(1) of the Act, 21  U.S.C. section 360bbb-3(b)(1), unless the authorization is  terminated or revoked. Performed at [facility], [place] [address] [place], [place] [address]   Blood culture (routine single)     Status: None   Collection Time: 11/28/19 12:00 PM   Specimen: BLOOD RIGHT ARM  Result Value Ref Range Status   Specimen Description BLOOD RIGHT ARM  Final   Special Requests   Final    BOTTLES DRAWN AEROBIC AND ANAEROBIC Blood Culture adequate volume   Culture   Final    NO GROWTH 5 DAYS Performed at [facility], [place] [address], [place], [place] [address]    Report Status 12/03/2019 FINAL  Final  Urine culture     Status: None   Collection Time: 11/28/19  7:16 PM   Specimen: In/Out Cath Urine  Result Value Ref Range Status   Specimen Description IN/OUT CATH URINE  Final   Special Requests NONE  Final   Culture   Final    NO GROWTH Performed at [facility], [place] [address] [place], [place] [address]    Report Status 11/30/2019 FINAL  Final  Culture, blood (single)     Status: None   Collection Time: 11/28/19  9:38 PM   Specimen: BLOOD  Result Value Ref Range Status   Specimen Description BLOOD LEFT ANTECUBITAL  Final   Special Requests   Final    BOTTLES DRAWN  AEROBIC AND ANAEROBIC Blood Culture results may not be optimal due to an excessive volume of blood received in culture bottles   Culture   Final    NO GROWTH 5 DAYS Performed at [facility], [place] [address] [place], [place] [address]    Report Status 12/03/2019 FINAL  Final  MRSA PCR Screening     Status: None   Collection Time: 11/30/19  8:38 AM   Specimen: Nasopharyngeal  Result Value Ref Range Status   MRSA by PCR NEGATIVE NEGATIVE Final    Comment:        The GeneXpert MRSA Assay (FDA approved for NASAL specimens only), is one component of a comprehensive MRSA colonization surveillance program. It is not intended to diagnose MRSA infection nor to guide or monitor treatment for MRSA infections. Performed at [facility], [place] [address] [place], [place] [address]      Medications:   . acyclovir  400 mg Oral  BID  . allopurinol  300 mg Oral Daily  . B-complex with vitamin C  1 tablet Oral Daily  . Chlorhexidine Gluconate Cloth  6 each Topical Daily  . cholecalciferol  1,000 Units Oral Daily  . fluticasone  2 spray Each Nare Daily  . fluticasone furoate-vilanterol  1 puff Inhalation Daily  . montelukast  10 mg Oral QHS  . pregabalin  200 mg Oral TID  . rosuvastatin  5 mg Oral Daily  . sodium chloride flush  10-40 mL Intracatheter Q12H  . tamsulosin  0.4 mg Oral Daily  . vitamin B-12  1,000 mcg Oral Daily   Continuous Infusions: . sodium chloride 1,000 mL (11/30/19 0838)  . ampicillin-sulbactam (UNASYN) IV 3 g (12/04/19 0740)      LOS: 6 days   Charlynne Cousins  [facility]  12/04/2019, 10:23 AM

## 2019-12-04 NOTE — Progress Notes (Signed)
SATURATION QUALIFICATIONS: (This note is used to comply with regulatory documentation for home oxygen)  Patient Saturations on Room Air at Rest = 98%  Patient Saturations on Room Air while Ambulating = 97%  Patient Saturations on 0 Liters of oxygen while Ambulating = 95  Please briefly explain why patient needs home oxygen:  Patient is alert and oriented x 4 and expresses no need of oxygen. Oxygen saturation at rest is 98% prior to ambulation and has maintain 95-97% during ambulation with a front wheel walker. Patient verbalizes he feels fine and does not have any shortness of breathe. Patient's wife is at the bedside. Call bell is within reach.

## 2019-12-04 NOTE — Progress Notes (Signed)
Foley stat lock was put in place for patients. Pt removed it and refused it. Pt educated.

## 2019-12-04 NOTE — Progress Notes (Signed)
Occupational Therapy Treatment Patient Details Name: Ryan Copeland MRN: [id number] DOB: [birth year] Today's Date: 12/04/2019    History of present illness Ryan Copeland is a 69 y.o. 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  39 of HPI obtained from patient spouse, ED physician, EMS reports due to patient's altered mental status and inability to participate in HPI.  Apparently, patient has been slowly declining since his recent second Covid-19 vaccination on September 2021.  He was seen by his PCP recently on 11/20/2019 and given treatment with azithromycin, Levaquin and prednisone.  He continues to slowly decline with associated mental status changes, weakness.  No other recent changes in his medications or sick contacts.   OT comments  Patient continues to initially decline OT.  He happened to need to use the restroom this date, and I managed to have him change his socks, gown, and move a wash cloth around for minimal peri care, grooming.  Patient was able to complete all self care with setup sit/stand with supervision when standing - no LOB.  He walked to the bathroom needing assist with IV using a RW.  Patient required no assist with hygiene or pant management.  Patient is expressing that he can do things for himself, and would rather not continue with OT in the acute setting.  He plans on going home, states he and his wife have a routine that works for them.  Discharge acute OT.    Follow Up Recommendations  No OT follow up    Equipment Recommendations  None recommended by OT    Recommendations for Other Services      Precautions / Restrictions Precautions Precautions: Fall Restrictions Other Position/Activity Restrictions: new catheter       Mobility Bed Mobility Overal bed mobility: Modified Independent Bed  Mobility: Supine to Sit;Sit to Supine     Supine to sit: Modified independent (Device/Increase time) Sit to supine: Modified independent (Device/Increase time)      Transfers Overall transfer level: Needs assistance Equipment used: Rolling walker (2 wheeled) Transfers: Sit to/from Stand Sit to Stand: Modified independent (Device/Increase time)              Balance   Sitting-balance support: No upper extremity supported;Feet supported Sitting balance-Leahy Scale: Good     Standing balance support: Bilateral upper extremity supported Standing balance-Leahy Scale: Fair                             ADL either performed or assessed with clinical judgement   ADL Overall ADL's : Needs assistance/impaired     Grooming: Wash/dry hands;Wash/dry face;Set up;Sitting   Upper Body Bathing: Set up;Sitting   Lower Body Bathing: Supervison/ safety;Sit to/from stand       Lower Body Dressing: Supervision/safety;Sitting/lateral leans   Toilet Transfer: Supervision/safety;Regular Toilet;Ambulation   Toileting- Clothing Manipulation and Hygiene: Independent;Sit to/from stand       Functional mobility during ADLs: Rolling walker;Supervision/safety General ADL Comments: assist with IV pole                           Pertinent Vitals/ Pain       Pain Assessment: No/denies pain   Progress Toward Goals  OT Goals(current goals can now be found in the care plan section)  Progress towards OT goals: Progressing toward goals  Acute Rehab OT Goals Patient Stated Goal: I want to go home tomorrow OT Goal Formulation: With patient Time For Goal Achievement: 12/14/19 Potential to Achieve Goals: Good  Plan Discharge plan remains appropriate    Co-evaluation                 AM-PAC OT "6 Clicks" Daily Activity     Outcome Measure   Help from another person eating meals?: None Help from another person taking care of personal grooming?: None Help from  another person toileting, which includes using toliet, bedpan, or urinal?: A Little Help from another person bathing (including washing, rinsing, drying)?: A Little Help from another person to put on and taking off regular upper body clothing?: None Help from another person to put on and taking off regular lower body clothing?: None 6 Click Score: 22    End of Session Equipment Utilized During Treatment: Rolling walker  OT Visit Diagnosis: Unsteadiness on feet (R26.81);Muscle weakness (generalized) (M62.81);Pain   Activity Tolerance Patient limited by fatigue   Patient Left in bed;with call bell/phone within reach   Nurse Communication          Time: 3338-3291 OT Time Calculation (min): 32 min  Charges: OT General Charges $OT Visit: 1 Visit OT Treatments $Self Care/Home Management : 23-37 mins  12/04/2019  Rich, OTR/L  Acute Rehabilitation Services  Office:  [phone]    Metta Clines 12/04/2019, 9:57 AM

## 2019-12-05 ENCOUNTER — Ambulatory Visit (HOSPITAL_COMMUNITY): Admission: RE | Admit: 2019-12-05 | Payer: Medicare Other | Source: Ambulatory Visit

## 2019-12-05 DIAGNOSIS — R6520 Severe sepsis without septic shock: Secondary | ICD-10-CM

## 2019-12-05 LAB — CBC WITH DIFFERENTIAL/PLATELET
Abs Immature Granulocytes: 0.1 10*3/uL — ABNORMAL HIGH (ref 0.00–0.07)
Basophils Absolute: 0 10*3/uL (ref 0.0–0.1)
Basophils Relative: 1 %
Eosinophils Absolute: 0.1 10*3/uL (ref 0.0–0.5)
Eosinophils Relative: 4 %
HCT: 31.2 % — ABNORMAL LOW (ref 39.0–52.0)
Hemoglobin: 10.1 g/dL — ABNORMAL LOW (ref 13.0–17.0)
Immature Granulocytes: 6 %
Lymphocytes Relative: 19 %
Lymphs Abs: 0.3 10*3/uL — ABNORMAL LOW (ref 0.7–4.0)
MCH: 32.8 pg (ref 26.0–34.0)
MCHC: 32.4 g/dL (ref 30.0–36.0)
MCV: 101.3 fL — ABNORMAL HIGH (ref 80.0–100.0)
Monocytes Absolute: 0.3 10*3/uL (ref 0.1–1.0)
Monocytes Relative: 15 %
Neutro Abs: 0.9 10*3/uL — ABNORMAL LOW (ref 1.7–7.7)
Neutrophils Relative %: 55 %
Platelets: 71 10*3/uL — ABNORMAL LOW (ref 150–400)
RBC: 3.08 MIL/uL — ABNORMAL LOW (ref 4.22–5.81)
RDW: 16.5 % — ABNORMAL HIGH (ref 11.5–15.5)
WBC: 1.6 10*3/uL — ABNORMAL LOW (ref 4.0–10.5)
nRBC: 0 % (ref 0.0–0.2)

## 2019-12-05 MED ORDER — HEPARIN SOD (PORK) LOCK FLUSH 100 UNIT/ML IV SOLN
500.0000 [IU] | INTRAVENOUS | Status: AC | PRN
  Administered 2019-12-05: 500 [IU]
  Filled 2019-12-05: qty 5

## 2019-12-05 MED ORDER — TAMSULOSIN HCL 0.4 MG PO CAPS
0.4000 mg | ORAL_CAPSULE | Freq: Every day | ORAL | 0 refills | Status: DC
Start: 2019-12-06 — End: 2021-02-08

## 2019-12-05 MED ORDER — AMOXICILLIN-POT CLAVULANATE 875-125 MG PO TABS
1.0000 | ORAL_TABLET | Freq: Two times a day (BID) | ORAL | 0 refills | Status: DC
Start: 2019-12-05 — End: 2019-12-20

## 2019-12-05 MED ORDER — AMOXICILLIN-POT CLAVULANATE 875-125 MG PO TABS
1.0000 | ORAL_TABLET | Freq: Two times a day (BID) | ORAL | Status: DC
  Administered 2019-12-05: 1 via ORAL
  Filled 2019-12-05: qty 1

## 2019-12-05 MED ORDER — TAMSULOSIN HCL 0.4 MG PO CAPS
0.4000 mg | ORAL_CAPSULE | Freq: Every day | ORAL | 0 refills | Status: DC
Start: 2019-12-06 — End: 2019-12-05

## 2019-12-05 MED ORDER — AMOXICILLIN-POT CLAVULANATE 875-125 MG PO TABS
1.0000 | ORAL_TABLET | Freq: Two times a day (BID) | ORAL | 0 refills | Status: DC
Start: 2019-12-05 — End: 2019-12-05

## 2019-12-05 NOTE — Discharge Summary (Signed)
Physician Discharge Summary  Ryan Copeland BSJ:[id number] DOB: 19-Sep-1950 DOA: 11/28/2019  PCP: [name]., PA-C  Admit date: 11/28/2019 Discharge date: 12/05/2019  Admitted From: Home Disposition:  Home  Recommendations for Outpatient Follow-up:  1. Follow up with oncologist in 1-2 weeks 2. Please obtain BMP/CBC in one week, will need to follow-up on his white blood cell and hemoglobin count which was borderline low in the hospital.    Home Health:No Equipment/Devices:None  Discharge Condition:Stable CODE STATUS:Full Diet recommendation: Heart Healthy  Brief/Interim Summary: 69 y.o. 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 Blood cultures remain negative till date he was transitioned to oral Augmentin which she will continue for 1 more days and outpatient.  Acute metabolic encephalopathy: Likely due to infectious etiology is now resolved.  Vitamin B12  deficiency: He has been transition to intramuscular vitamin B12 as an outpatient monthly.  CLL B-cell lymphoma: Follow-up with Dr. Verdell Face as an outpatient.  Acute thrombocytopenia: His platelet count dropped to 30, her Lovenox was held he was placed on SCD there is likely to be infectious etiology once her infection was under control aspect and started to improve.  Leukopenia: Likely due to chemotherapy it was monitored it is stabilized at 1.8, will need to follow-up with oncology as an outpatient and recheck a CBC in 1 to 2 weeks.  COPD: Continue inhalers.  Peripheral neuropathy: Follow-up with neurology as an outpatient continue Lyrica no changes made.  Physical deconditioning: Physical therapy evaluated the patient and recommended home health PT.  Acute urinary retention: He was on Flomax renal ultrasound showed no hydronephrosis, urology was consulted to place a Foley and he will need to follow-up with urology as an outpatient for urodynamic studies.  He will continue on Flomax.   Discharge Instructions  Discharge Instructions    Diet - low sodium heart healthy   Complete by: As directed    Increase activity slowly   Complete by: As directed      Allergies as of 12/05/2019      Reactions   Privigen [immune Globulin] Hives   Pt reports hives/rash on body with IV Privigen. He was changed to IV Gamunex-C and had many infusions without adverse side effects.   Codeine Hives   Pt states he can take a few, more reaction with extended doses.      Medication List    TAKE these medications   acyclovir 400 MG tablet Commonly known as: ZOVIRAX TAKE 1 TABLET BY MOUTH TWICE A DAY   albuterol 108 (90 Base)  MCG/ACT inhaler Commonly known as: Proventil HFA TAKE 2 PUFFS BY MOUTH EVERY 6 HOURS AS NEEDED FOR WHEEZE OR SHORTNESS OF BREATH What changed:   how much to take  how to take this  when to take this  reasons to take this   allopurinol 300 MG tablet Commonly known  as: ZYLOPRIM TAKE 1 TABLET BY MOUTH EVERY DAY   ALPRAZolam 0.25 MG tablet Commonly known as: XANAX Take 0.5 mg by mouth as needed for anxiety.   amoxicillin-clavulanate 875-125 MG tablet Commonly known as: AUGMENTIN Take 1 tablet by mouth every 12 (twelve) hours.   b complex vitamins tablet Take 1 tablet by mouth daily.   benzonatate 100 MG capsule Commonly known as: TESSALON Take 2 capsules (200 mg total) by mouth 3 (three) times daily as needed for cough.   Breo Ellipta 200-25 MCG/INH Aepb Generic drug: fluticasone furoate-vilanterol Inhale 1 puff into the lungs daily.   Calquence 100 MG capsule Generic drug: acalabrutinib TAKE 100 MG BY MOUTH 2 (TWO) TIMES DAILY. What changed: See the new instructions.   famotidine 20 MG tablet Commonly known as: PEPCID TAKE 1 TABLET BY MOUTH TWICE A DAY What changed:   when to take this  reasons to take this   furosemide 20 MG tablet Commonly known as: LASIX TAKE 1 TABLET BY MOUTH EVERY DAY AS NEEDED What changed: reasons to take this   HYDROcodone-acetaminophen 10-325 MG tablet Commonly known as: NORCO Take 1 tablet by mouth See admin instructions. Every 6 to 8 hours as needed for pain   HYDROcodone-homatropine 5-1.5 MG/5ML syrup Commonly known as: HYCODAN Take 5 mLs by mouth every 6 (six) hours as needed for cough.   lidocaine-prilocaine cream Commonly known as: EMLA APPLY TO AFFECTED AREA ONCE What changed: See the new instructions.   loperamide 2 MG capsule Commonly known as: IMODIUM Take 2 capsules (4 mg total) by mouth every 6 (six) hours as needed for diarrhea or loose stools.   meclizine 12.5 MG tablet Commonly known as: ANTIVERT Take 12.5 mg by mouth every 6 (six) hours as needed for dizziness.   metFORMIN 500 MG 24 hr tablet Commonly known as: GLUCOPHAGE-XR Take 500-1,000 mg by mouth See admin instructions. Takes one tablet (500mg )  in AM and 2 tablets (1000mg ) at night.   mometasone 50 MCG/ACT nasal  spray Commonly known as: NASONEX Place 2 sprays into the nose daily.   montelukast 10 MG tablet Commonly known as: SINGULAIR Take 1 tablet (10 mg total) by mouth daily.   nitroGLYCERIN 0.4 MG SL tablet Commonly known as: NITROSTAT PLACE 1 TABLET UNDER THE TONGUE EVERY 5 MINUTES X 3 DOSES AS NEEDED FOR CHEST PAIN *MAX 3 DOSES* What changed:   how much to take  how to take this  when to take this   predniSONE 20 MG tablet Commonly known as: DELTASONE Take 20 mg by mouth See admin instructions. Take 1 tablet (20mg ) by mouth three times daily for 3 days, then take 1 tablet (20mg ) by mouth twice daily for 3 days, then take 1 tablet by mouth daily for 3 days.   pregabalin 200 MG capsule Commonly known as: LYRICA TAKE 1 CAPSULE (200 MG TOTAL) BY MOUTH 3 (THREE) TIMES DAILY.   rosuvastatin 5 MG tablet Commonly known as: CRESTOR TAKE 1 TABLET BY MOUTH EVERY DAY   Vitamin D-1000 Max St 25 MCG (1000 UT) tablet Generic drug: Cholecalciferol Take 1,000 Units by mouth daily.   zolpidem 10 MG tablet Commonly known as: AMBIEN  Take 10 mg by mouth at bedtime as needed for sleep.            Durable Medical Equipment  (From admission, onward)         Start     Ordered   12/03/19 1553  For home use only DME Walker rolling  Once       Question Answer Comment  Walker: With 5 Inch Wheels   Patient needs a walker to treat with the following condition Weakness      12/03/19 1552          Follow-up Information    Janith Lima, MD. Schedule an appointment as soon as possible for a visit in 1 week.   Specialty: Urology Why: GO: NOV. 18 AT 12:30PM Contact information: [place] [address] [phone]        [name]., PA-C.   Specialty: Family Medicine Why: GO: OCT. 26 AT 9:40AM Contact information: [address] [place] [address] [phone]        [facility], [employer].   Why: rolling walker Contact information: [address] [place] [address] [phone]              Allergies  Allergen Reactions  . Privigen [Immune Globulin] Hives    Pt reports hives/rash on body with IV Privigen. He was changed to IV Gamunex-C and had many infusions without adverse side effects.  . Codeine Hives    Pt states he can take a few, more reaction with extended doses.    Consultations:  None   Procedures/Studies: CT Head Wo Contrast  Result Date: 11/29/2019 CLINICAL DATA:  69 year old male with altered mental status. EXAM: CT HEAD WITHOUT CONTRAST TECHNIQUE: Contiguous axial images were obtained from the base of the skull through the vertex without intravenous contrast. COMPARISON:  None. FINDINGS: Brain: The ventricles and sulci appropriate size for patient's age. Minimal periventricular and deep white matter chronic microvascular ischemic changes noted. There is no acute intracranial hemorrhage. No mass effect midline shift. No extra-axial fluid collection. Vascular: No hyperdense vessel or unexpected calcification. Skull: Normal. Negative for fracture or focal lesion. Sinuses/Orbits: There is diffuse mucoperiosteal thickening of paranasal sinuses with near complete opacification of the maxillary sinuses and ethmoid air cells. No air-fluid level. The mastoid air cells are clear. Other: None IMPRESSION: 1. No acute intracranial pathology. 2. Paranasal sinus disease. Electronically Signed   By: Anner Crete M.D.   On: 11/29/2019 19:41   CT Angio Chest PE W and/or Wo Contrast  Result Date: 11/29/2019 CLINICAL DATA:  Shortness of breath. Clinical concern for pulmonary embolism. EXAM: CT ANGIOGRAPHY CHEST WITH CONTRAST TECHNIQUE: Multidetector CT imaging of the chest was performed using the standard protocol during bolus administration of intravenous contrast. Multiplanar CT image reconstructions and MIPs were obtained to evaluate the vascular anatomy. CONTRAST:  72mL OMNIPAQUE IOHEXOL 350 MG/ML SOLN COMPARISON:   11/29/2018. FINDINGS: Cardiovascular: There is satisfactory opacification of the pulmonary arteries to the segmental level. There is no evidence of a pulmonary embolism. Heart is top-normal in size. There are three-vessel coronary artery calcifications. No pericardial effusion. Great vessels are normal caliber. There is aortic atherosclerosis no significant stenosis. No dissection Mediastinum/Nodes: No neck base, axillary, mediastinal or hilar masses or enlarged lymph nodes. Trachea and esophagus are unremarkable. Lungs/Pleura: Multiple areas of lung consolidation. Many of these are nodular in configuration. There are ill-defined masslike areas of opacity in the left upper lobe, lateral to the left  hilum, several of which shows central air density consistent cavitation. There is also dependent ground-glass type opacity which may reflect atelectasis infection or a combination. These findings are all new since the prior chest CT. The largest masslike opacity in the left upper lobe measures 3 cm in greatest transverse dimension and is centered on image 74, series 6. Larger stool nodular opacity on the right is in the lower lobe, image 91, series 6, 1.2 cm in size. Small left and minimal right pleural effusions.  No pneumothorax. Upper Abdomen: No acute findings. Partly imaged spleen is enlarged, 12.5 cm in greatest transverse dimension, unchanged from the prior CT. Musculoskeletal: No fracture or acute finding. No osteoblastic or osteolytic lesions. Review of the MIP images confirms the above findings. IMPRESSION: 1. No evidence of a pulmonary embolism. 2. Bilateral areas of lung consolidation consistent with multifocal pneumonia. Many of these areas are nodular to mass-like in configuration, but still suspected to be infectious in etiology. Recommend follow-up chest CT after completion of therapy and symptom improvement to document improvement/resolution on imaging. There are associated small left and minimal right  pleural effusions. Aortic Atherosclerosis (ICD10-I70.0). Electronically Signed   By: Lajean Manes M.D.   On: 11/29/2019 19:46   US RENAL  Result Date: 12/01/2019 CLINICAL DATA:  Urinary retention EXAM: RENAL / URINARY TRACT ULTRASOUND COMPLETE COMPARISON:  CT abdomen/pelvis dated 11/29/2018 FINDINGS: Right Kidney: Renal measurements: 10.4 x 5.3 x 4.9 cm = volume: 142.5 mL. Echogenicity within normal limits. No mass or hydronephrosis visualized. Left Kidney: Renal measurements: 11.9 x 4.7 x 5.6 cm = volume: 163.3 mL. Echogenicity within normal limits. No mass or hydronephrosis visualized. Bladder: Appears normal for degree of bladder distention. Other: Splenomegaly, better evaluated on prior CT. Prostate indents the base of the bladder, suggesting BPH. IMPRESSION: Suspected BPH. This may account for the patient's urinary retention, on the basis of chronic bladder outlet obstruction. No hydronephrosis. Splenomegaly, previously noted on CT. Electronically Signed   By: Julian Hy M.D.   On: 12/01/2019 11:52   DG Chest Port 1 View  Result Date: 11/28/2019 CLINICAL DATA:  Questionable sepsis. Patient lethargic and ill for 4 weeks. EXAM: PORTABLE CHEST 1 VIEW COMPARISON:  None. FINDINGS: Extensive peripheral predominant airspace opacities in the left midlung and lung base. No visible pleural effusions or pneumothorax. Mild enlargement the cardiac silhouette. Coronary artery stent. Aortic atherosclerosis. Right approach Port-A-Cath with the tip projecting at the inferior SVC. No acute osseous abnormality. IMPRESSION: 1. Extensive peripheral predominant airspace opacities in the left midlung and lung base, compatible with pneumonia. While nonspecific, consider COVID. 2. Mild cardiomegaly. Electronically Signed   By: Margaretha Sheffield MD   On: 11/28/2019 12:27   NCV with EMG(electromyography)  Result Date: 11/25/2019 Marcial Pacas, MD     11/25/2019  9:38 AM     Full Name: Eddie DibblesRichard" Georgina Peer Gender:  Male MRN #: [id number] Date of Birth: 08-Jun-1950   Visit Date: 11/25/2019 07:27 Age: 69 Years Examining Physician: Marcial Pacas, MD Referring Physician: Marcial Pacas, MD Height: 6 feet 0 inch History: 69 year old male with history of progressive bilateral feet paresthesia, gait abnormality, Summary of the test: Nerve conduction study: Right sural, superficial peroneal, bilateral ulnar sensory response was absent.  Bilateral median and radial sensory responses showed significantly prolonged peak latency, with mildly decreased snap amplitude. Right peroneal to EDB, tibial motor responses were absent.  Bilateral ulnar motor responses showed mildly prolonged distal latency, with moderately decreased CMAP amplitude.  Bilateral median motor responses showed significantly  prolonged distal latency, significantly decreased CMAP amplitude, and conduction velocity. All the motor F wave latencies were significantly prolonged, there is also evidence of temporal dispersion right median, left median, and left ulnar motor response. Electromyography: Selected needle examinations of right upper, lower extremity muscles, right cervical and lumbosacral paraspinal muscles were performed. There was evidence of chronic neuropathic changes involving right upper and lower extremity muscles.  There is evidence of increased activity at right cervical and lumbosacral paraspinal muscles. Conclusion: This is an abnormal study.  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 -------------------------------  Marcial Pacas, MD PhD Physician Name [facility] [place], [place] [address] Tel: [phone] Fax: [phone] Verbal informed consent was obtained from the patient, patient was informed of potential risk of procedure, including bruising, bleeding, hematoma formation, infection, muscle weakness, muscle pain, numbness, among others.     [place]   Nerve / Sites Muscle Latency Ref. Amplitude Ref.  Rel Amp Segments Distance Velocity Ref. Area   ms ms mV mV %  cm m/s m/s mVms L Median - APB    Wrist APB 6.3 ?4.4 3.4 ?4.0 100 Wrist - APB 7   18.7    Upper arm APB 13.0  2.5  73.1 Upper arm - Wrist 21 31 ?49 18.3 R Median - APB    Wrist APB 8.5 ?4.4 1.3 ?4.0 100 Wrist - APB 7   11.2    Upper arm APB 15.5  1.0  73.2 Upper arm - Wrist 24 34 ?49 9.7 L Ulnar - ADM    Wrist ADM 4.4 ?3.3 7.8 ?6.0 100 Wrist - ADM 7   29.7    B.Elbow ADM 10.4  5.1  65 B.Elbow - Wrist 22 36 ?49 27.6    A.Elbow ADM 13.4  4.6  89.7 A.Elbow - B.Elbow 10 34 ?49 25.4        A.Elbow - Wrist     R Ulnar - ADM    Wrist ADM 4.8 ?3.3 8.4 ?6.0 100 Wrist - ADM 9   26.2    B.Elbow ADM 10.0  7.4  87.8 B.Elbow - Wrist 20 39 ?49 24.5        A.Elbow - B.Elbow 10 38 ?49         A.Elbow - Wrist     R Peroneal - EDB    Ankle EDB NR ?6.5 NR ?2.0 NR Ankle - EDB 9   NR    Fib head EDB NR  NR  NR Fib head - Ankle 30 NR ?44 NR R Tibial - AH    Ankle AH NR ?5.8 NR ?4.0 NR Ankle - AH 9 NR  NR               SNC   Nerve / Sites Rec. Site Peak Lat Ref.  Amp Ref. Segments Distance   ms ms V V  cm L Radial - Anatomical snuff box (Forearm)    Forearm Wrist 3.9 ?2.9 10 ?15 Forearm - Wrist 10 R Radial - Anatomical snuff box (Forearm)    Forearm Wrist 3.7 ?2.9 10 ?15 Forearm - Wrist 10 R Sural - Ankle (Calf)    Calf Ankle NR ?4.4 NR ?6 Calf - Ankle 14 R Superficial peroneal - Ankle    Lat leg Ankle NR ?4.4 NR ?6 Lat leg - Ankle 14 L Median - Orthodromic (Dig II, Mid palm)    Dig II Wrist 5.0 ?3.4 4 ?10 Dig II - Wrist 13 R Median - Orthodromic (  Dig II, Mid palm)    Dig II Wrist 4.9 ?3.4 5 ?10 Dig II - Wrist 16 L Ulnar - Orthodromic, (Dig V, Mid palm)    Dig V Wrist NR ?3.1 NR ?5 Dig V - Wrist 11 R Ulnar - Orthodromic, (Dig V, Mid palm)    Dig V Wrist NR ?3.1 NR ?5 Dig V - Wrist 76                   F  Wave   Nerve F Lat Ref.  ms ms L Median - APB 43.2 ?31.0 L Ulnar - ADM 40.8 ?32.0 R Median - APB 47.5 ?31.0 R Ulnar - ADM 42.0 ?32.0           EMG Summary Table   Spontaneous  MUAP Recruitment Muscle IA Fib PSW Fasc Other Amp Dur. Poly Pattern R. Tibialis anterior Increased None None None _______ Increased Increased 1+ Reduced R. Tibialis posterior Increased 1+ None None _______ Increased Increased 1+ Reduced R. Peroneus longus Increased 1+ None None _______ Increased Increased 1+ Reduced R. Gastrocnemius (Medial head) Increased 1+ None None _______ Increased Increased 1+ Reduced R. Vastus lateralis Increased None None None _______ Increased Increased 1+ Reduced R. Lumbar paraspinals (low) Increased None None None _______ Normal Normal Normal Normal R. Lumbar paraspinals (mid) Increased None None None _______ Normal Normal Normal Normal R. First dorsal interosseous Increased 1+ None None _______ Increased Increased 1+ Reduced R. Pronator teres Increased 1+ None None _______ Increased Increased 1+ Normal R. Biceps brachii Increased None None None _______ Increased Increased 1+ Normal R. Deltoid Increased None None None _______ Increased Increased Normal Normal R. Triceps brachii Increased None None None _______ Increased Increased Normal Reduced R. Cervical paraspinals Normal None None None _______ Normal Normal Normal Normal      Subjective: No complaints feels great.  Discharge Exam: Vitals:   12/05/19 0033 12/05/19 0846  BP:  (!) 102/56  Pulse: (!) 55 (!) 56  Resp:  20  Temp: 97.8 F (36.6 C) 97.7 F (36.5 C)  SpO2:  98%   Vitals:   12/04/19 2200 12/05/19 0033 12/05/19 0300 12/05/19 0846  BP:    (!) 102/56  Pulse: 65 (!) 55  (!) 56  Resp:    20  Temp: (!) 97.4 F (36.3 C) 97.8 F (36.6 C)  97.7 F (36.5 C)  TempSrc: Oral Oral  Axillary  SpO2:    98%  Weight:   75.8 kg   Height:        General: Pt is alert, awake, not in acute distress Cardiovascular: RRR, S1/S2 +, no rubs, no gallops Respiratory: CTA bilaterally, no wheezing, no rhonchi Abdominal: Soft, NT, ND, bowel sounds + Extremities: no edema, no cyanosis    The results of significant  diagnostics from this hospitalization (including imaging, microbiology, ancillary and laboratory) are listed below for reference.     Microbiology: Recent Results (from the past 240 hour(s))  Respiratory Panel by RT PCR (Flu A&B, Covid) -     Status: None   Collection Time: 11/28/19 11:27 AM   Specimen: Nasopharyngeal  Result Value Ref Range Status   SARS Coronavirus 2 by RT PCR NEGATIVE NEGATIVE Final    Comment: (NOTE) SARS-CoV-2 target nucleic acids are NOT DETECTED.  The SARS-CoV-2 RNA is generally detectable in upper respiratoy specimens during the acute phase of infection. The lowest concentration of SARS-CoV-2 viral copies this assay can detect is 131 copies/mL. A negative result does not preclude SARS-Cov-2 infection  and should not be used as the sole basis for treatment or other patient management decisions. A negative result may occur with  improper specimen collection/handling, submission of specimen other than nasopharyngeal swab, presence of viral mutation(s) within the areas targeted by this assay, and inadequate number of viral copies (<131 copies/mL). A negative result must be combined with clinical observations, patient history, and epidemiological information. The expected result is Negative.  Fact Sheet for Patients:  PinkCheek.be  Fact Sheet for Healthcare Providers:  GravelBags.it  This test is no t yet approved or cleared by the [place] FDA and  has been authorized for detection and/or diagnosis of SARS-CoV-2 by FDA under an Emergency Use Authorization (EUA). This EUA will remain  in effect (meaning this test can be used) for the duration of the COVID-19 declaration under Section 564(b)(1) of the Act, 21 U.S.C. section 360bbb-3(b)(1), unless the authorization is terminated or revoked sooner.     Influenza A by PCR NEGATIVE NEGATIVE Final   Influenza B by PCR NEGATIVE NEGATIVE Final     Comment: (NOTE) The Xpert Xpress SARS-CoV-2/FLU/RSV assay is intended as an aid in  the diagnosis of influenza from Nasopharyngeal swab specimens and  should not be used as a sole basis for treatment. Nasal washings and  aspirates are unacceptable for Xpert Xpress SARS-CoV-2/FLU/RSV  testing.  Fact Sheet for Patients: PinkCheek.be  Fact Sheet for Healthcare Providers: GravelBags.it  This test is not yet approved or cleared by the [place] FDA and  has been authorized for detection and/or diagnosis of SARS-CoV-2 by  FDA under an Emergency Use Authorization (EUA). This EUA will remain  in effect (meaning this test can be used) for the duration of the  Covid-19 declaration under Section 564(b)(1) of the Act, 21  U.S.C. section 360bbb-3(b)(1), unless the authorization is  terminated or revoked. Performed at [facility], [place] [address], [place], [place] [address]   Blood culture (routine single)     Status: None   Collection Time: 11/28/19 12:00 PM   Specimen: BLOOD RIGHT ARM  Result Value Ref Range Status   Specimen Description BLOOD RIGHT ARM  Final   Special Requests   Final    BOTTLES DRAWN AEROBIC AND ANAEROBIC Blood Culture adequate volume   Culture   Final    NO GROWTH 5 DAYS Performed at [facility], [place] [address], [place], [place] [address]    Report Status 12/03/2019 FINAL  Final  Urine culture     Status: None   Collection Time: 11/28/19  7:16 PM   Specimen: In/Out Cath Urine  Result Value Ref Range Status   Specimen Description IN/OUT CATH URINE  Final   Special Requests NONE  Final   Culture   Final    NO GROWTH Performed at [facility], [place] [address], [place], [place] [address]    Report Status 11/30/2019 FINAL  Final  Culture, blood (single)     Status: None   Collection Time: 11/28/19  9:38 PM   Specimen: BLOOD  Result Value Ref Range Status   Specimen Description BLOOD  LEFT ANTECUBITAL  Final   Special Requests   Final    BOTTLES DRAWN AEROBIC AND ANAEROBIC Blood Culture results may not be optimal due to an excessive volume of blood received in culture bottles   Culture   Final    NO GROWTH 5 DAYS Performed at [facility], [place] [address], [place], [place] [address]  Report Status 12/03/2019 FINAL  Final  MRSA PCR Screening     Status: None   Collection Time: 11/30/19  8:38 AM   Specimen: Nasopharyngeal  Result Value Ref Range Status   MRSA by PCR NEGATIVE NEGATIVE Final    Comment:        The GeneXpert MRSA Assay (FDA approved for NASAL specimens only), is one component of a comprehensive MRSA colonization surveillance program. It is not intended to diagnose MRSA infection nor to guide or monitor treatment for MRSA infections. Performed at [facility], [place] [address], [place], [place] [address]      Labs: BNP (last 3 results) No results for input(s): BNP in the last 8760 hours. Basic Metabolic Panel: Recent Labs  Lab 11/30/19 0443 12/01/19 0456 12/02/19 0500 12/03/19 0528 12/04/19 0500  NA 142 141 138 140 140  K 2.8* 3.9 3.8 3.6 3.8  CL 105 109 107 108 108  CO2 26 24 27 26 25   GLUCOSE 107* 97 102* 105* 93  BUN 11 11 9  5* <5*  CREATININE 1.02 0.94 0.77 0.66 0.65  CALCIUM 8.2* 7.5* 7.6* 7.6* 7.9*  MG 1.7 1.9  --   --   --    Liver Function Tests: Recent Labs  Lab 11/28/19 1131 11/29/19 0402 11/30/19 0443  AST 20 31 24   ALT 15 18 18   ALKPHOS 54 47 47  BILITOT 0.6 1.4* 1.5*  PROT 4.8* 4.4* 4.6*  ALBUMIN 2.8* 2.5* 2.5*   No results for input(s): LIPASE, AMYLASE in the last 168 hours. No results for input(s): AMMONIA in the last 168 hours. CBC: Recent Labs  Lab 11/28/19 1131 11/28/19 1846 12/01/19 0456 12/02/19 0500 12/03/19 0528 12/04/19 0500 12/05/19 0425  WBC 3.7*   < > 2.0* 2.3* 2.4* 1.8* 1.6*  NEUTROABS 2.4  --   --   --   --   --  0.9*  HGB 12.3*   < > 8.8* 8.8* 8.9* 9.7* 10.1*  HCT 38.6*    < > 26.7* 27.0* 27.8* 30.3* 31.2*  MCV 102.4*   < > 102.7* 102.3* 101.8* 101.7* 101.3*  PLT PLATELET CLUMPS NOTED ON SMEAR, COUNT APPEARS DECREASED   < > 39* 33* 42* 54* 71*   < > = values in this interval not displayed.   Cardiac Enzymes: No results for input(s): CKTOTAL, CKMB, CKMBINDEX, TROPONINI in the last 168 hours. BNP: Invalid input(s): POCBNP CBG: Recent Labs  Lab 11/30/19 1201 11/30/19 1636 11/30/19 2125 12/01/19 0604 12/01/19 1205  GLUCAP 138* 120* 90 96 119*   D-Dimer No results for input(s): DDIMER in the last 72 hours. Hgb A1c No results for input(s): HGBA1C in the last 72 hours. Lipid Profile No results for input(s): CHOL, HDL, LDLCALC, TRIG, CHOLHDL, LDLDIRECT in the last 72 hours. Thyroid function studies No results for input(s): TSH, T4TOTAL, T3FREE, THYROIDAB in the last 72 hours.  Invalid input(s): FREET3 Anemia work up No results for input(s): VITAMINB12, FOLATE, FERRITIN, TIBC, IRON, RETICCTPCT in the last 72 hours. Urinalysis    Component Value Date/Time   COLORURINE YELLOW 11/28/2019 1127   APPEARANCEUR CLEAR 11/28/2019 1127   LABSPEC 1.010 11/28/2019 1127   PHURINE 5.0 11/28/2019 1127   GLUCOSEU NEGATIVE 11/28/2019 1127   HGBUR NEGATIVE 11/28/2019 [place] 11/28/2019 1127   [place] 11/28/2019 1127   PROTEINUR NEGATIVE 11/28/2019 1127   UROBILINOGEN 0.2 02/03/2014 1356   NITRITE NEGATIVE 11/28/2019 [place] 11/28/2019 1127   Sepsis Labs  Invalid input(s): PROCALCITONIN,  WBC,  LACTICIDVEN Microbiology Recent Results (from the past 240 hour(s))  Respiratory Panel by RT PCR (Flu A&B, Covid) -     Status: None   Collection Time: 11/28/19 11:27 AM   Specimen: Nasopharyngeal  Result Value Ref Range Status   SARS Coronavirus 2 by RT PCR NEGATIVE NEGATIVE Final    Comment: (NOTE) SARS-CoV-2 target nucleic acids are NOT DETECTED.  The SARS-CoV-2 RNA is generally detectable in upper  respiratoy specimens during the acute phase of infection. The lowest concentration of SARS-CoV-2 viral copies this assay can detect is 131 copies/mL. A negative result does not preclude SARS-Cov-2 infection and should not be used as the sole basis for treatment or other patient management decisions. A negative result may occur with  improper specimen collection/handling, submission of specimen other than nasopharyngeal swab, presence of viral mutation(s) within the areas targeted by this assay, and inadequate number of viral copies (<131 copies/mL). A negative result must be combined with clinical observations, patient history, and epidemiological information. The expected result is Negative.  Fact Sheet for Patients:  PinkCheek.be  Fact Sheet for Healthcare Providers:  GravelBags.it  This test is no t yet approved or cleared by the [place] FDA and  has been authorized for detection and/or diagnosis of SARS-CoV-2 by FDA under an Emergency Use Authorization (EUA). This EUA will remain  in effect (meaning this test can be used) for the duration of the COVID-19 declaration under Section 564(b)(1) of the Act, 21 U.S.C. section 360bbb-3(b)(1), unless the authorization is terminated or revoked sooner.     Influenza A by PCR NEGATIVE NEGATIVE Final   Influenza B by PCR NEGATIVE NEGATIVE Final    Comment: (NOTE) The Xpert Xpress SARS-CoV-2/FLU/RSV assay is intended as an aid in  the diagnosis of influenza from Nasopharyngeal swab specimens and  should not be used as a sole basis for treatment. Nasal washings and  aspirates are unacceptable for Xpert Xpress SARS-CoV-2/FLU/RSV  testing.  Fact Sheet for Patients: PinkCheek.be  Fact Sheet for Healthcare Providers: GravelBags.it  This test is not yet approved or cleared by the [place] FDA and  has been  authorized for detection and/or diagnosis of SARS-CoV-2 by  FDA under an Emergency Use Authorization (EUA). This EUA will remain  in effect (meaning this test can be used) for the duration of the  Covid-19 declaration under Section 564(b)(1) of the Act, 21  U.S.C. section 360bbb-3(b)(1), unless the authorization is  terminated or revoked. Performed at [facility], [place] [address] [place], [place] [address]   Blood culture (routine single)     Status: None   Collection Time: 11/28/19 12:00 PM   Specimen: BLOOD RIGHT ARM  Result Value Ref Range Status   Specimen Description BLOOD RIGHT ARM  Final   Special Requests   Final    BOTTLES DRAWN AEROBIC AND ANAEROBIC Blood Culture adequate volume   Culture   Final    NO GROWTH 5 DAYS Performed at [facility], [place] [address], [place],  [address]    Report Status 12/03/2019 FINAL  Final  Urine culture     Status: None   Collection Time: 11/28/19  7:16 PM   Specimen: In/Out Cath Urine  Result Value Ref Range Status   Specimen Description IN/OUT CATH URINE  Final   Special Requests NONE  Final   Culture   Final    NO GROWTH Performed at [facility], [place] [address]  [place], [place] [address]    Report Status 11/30/2019 FINAL  Final  Culture, blood (single)     Status: None   Collection Time: 11/28/19  9:38 PM   Specimen: BLOOD  Result Value Ref Range Status   Specimen Description BLOOD LEFT ANTECUBITAL  Final   Special Requests   Final    BOTTLES DRAWN AEROBIC AND ANAEROBIC Blood Culture results may not be optimal due to an excessive volume of blood received in culture bottles   Culture   Final    NO GROWTH 5 DAYS Performed at [facility], [place] [address], [place], [place] [address]    Report Status 12/03/2019 FINAL  Final  MRSA PCR Screening     Status: None   Collection Time: 11/30/19  8:38 AM   Specimen: Nasopharyngeal  Result Value Ref Range Status   MRSA by PCR NEGATIVE NEGATIVE Final     Comment:        The GeneXpert MRSA Assay (FDA approved for NASAL specimens only), is one component of a comprehensive MRSA colonization surveillance program. It is not intended to diagnose MRSA infection nor to guide or monitor treatment for MRSA infections. Performed at [facility], [place] [address] [place], [place] [address]      Time coordinating discharge: Over 40 minutes  SIGNED:   Charlynne Cousins, MD  [facility] 12/05/2019, 9:22 AM Pager   If 7PM-7AM, please contact night-coverage [email or web address] Password [email or web address]

## 2019-12-05 NOTE — Progress Notes (Signed)
Physical Therapy Treatment Patient Details Name: Ryan Copeland MRN: [id number] DOB: 01/12/1951 Today's Date: 12/05/2019    History of Present Illness Ryan Copeland is a 69 y.o. 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  41 of HPI obtained from patient spouse, ED physician, EMS reports due to patient's altered mental status and inability to participate in HPI.  Apparently, patient has been slowly declining since his recent second Covid-19 vaccination on September 2021.  He was seen by his PCP recently on 11/20/2019 and given treatment with azithromycin, Levaquin and prednisone.  He continues to slowly decline with associated mental status changes, weakness.  No other recent changes in his medications or sick contacts.    PT Comments    Pt with improved participation in therapy session; looking forward to discharge home today. Pt ambulating 250 feet with a walker at a supervision level. Recommended use of walker initially for improved stability and increased independence.    Follow Up Recommendations  No PT follow up;Supervision/Assistance - 24 hour     Equipment Recommendations  Rolling walker with 5" wheels    Recommendations for Other Services       Precautions / Restrictions Precautions Precautions: Fall Restrictions Weight Bearing Restrictions: No    Mobility  Bed Mobility Overal bed mobility: Modified Independent                Transfers Overall transfer level: Modified independent Equipment used: Rolling walker (2 wheeled)                Ambulation/Gait Ambulation/Gait assistance: Supervision Gait Distance (Feet): 250 Feet Assistive device: Rolling walker (2 wheeled) Gait Pattern/deviations: Step-through pattern;Decreased stride length     General Gait Details: Steady pace, no  gross imbalance, supervision for safety   Stairs             Wheelchair Mobility    Modified Rankin (Stroke Patients Only)       Balance Overall balance assessment: Needs assistance Sitting-balance support: No upper extremity supported;Feet supported Sitting balance-Leahy Scale: Good     Standing balance support: No upper extremity supported;During functional activity Standing balance-Leahy Scale: Fair                              Cognition Arousal/Alertness: Awake/alert Behavior During Therapy: WFL for tasks assessed/performed Overall Cognitive Status: Within Functional Limits for tasks assessed                                        Exercises      General Comments        Pertinent Vitals/Pain Pain Assessment: No/denies pain    Home Living                      Prior Function            PT Goals (current goals can now be found in the care plan section) Acute Rehab PT Goals Patient Stated Goal: Ready to go home Potential to Achieve Goals: Good Progress towards PT goals: Progressing toward goals    Frequency    Min 3X/week      PT Plan Current plan remains appropriate    Co-evaluation  AM-PAC PT "6 Clicks" Mobility   Outcome Measure  Help needed turning from your back to your side while in a flat bed without using bedrails?: None Help needed moving from lying on your back to sitting on the side of a flat bed without using bedrails?: None Help needed moving to and from a bed to a chair (including a wheelchair)?: None Help needed standing up from a chair using your arms (e.g., wheelchair or bedside chair)?: None Help needed to walk in hospital room?: None Help needed climbing 3-5 steps with a railing? : A Little 6 Click Score: 23    End of Session   Activity Tolerance: Patient tolerated treatment well Patient left: in chair;with call bell/phone within reach;with chair alarm set Nurse  Communication: Mobility status PT Visit Diagnosis: Unsteadiness on feet (R26.81);Muscle weakness (generalized) (M62.81)     Time: 3295-1884 PT Time Calculation (min) (ACUTE ONLY): 17 min  Charges:  $Therapeutic Activity: 8-22 mins                     Wyona Almas, PT, DPT Acute Rehabilitation Services Pager [phone] Office [phone]    Deno Etienne 12/05/2019, 12:26 PM

## 2019-12-05 NOTE — TOC Transition Note (Signed)
Transition of Care [facility]) - CM/SW Discharge Note   Patient Details  Name: Ryan Copeland MRN: [id number] Date of Birth: 12-10-1950  [facility] CM/SW Contact:  Zenon Mayo, RN Phone Number: 12/05/2019, 9:33 AM   Clinical Narrative:    Patient is for dc today, per pt/ot eval no f/u needed,  [employer] has brought rolling walker to the room.  He has transportation at [employer].   Final next level of care: Home/Self Care Barriers to Discharge: No Barriers Identified   Patient Goals and CMS Choice Patient states their goals for this hospitalization and ongoing recovery are:: to get better   Choice offered to / list presented to : NA  Discharge Placement                       Discharge Plan and Services In-house Referral: NA Discharge Planning Services: CM Consult Post Acute Care Choice: NA          DME Arranged: Walker rolling DME Agency: [employer] Date DME Agency Contacted: 12/03/19 Time DME Agency Contacted: 85 Representative spoke with at DME Agency: [place]: NA          Social Determinants of Health ([place]) Interventions Food Insecurity Interventions: Intervention Not Indicated Transportation Interventions: Intervention Not Indicated   Readmission Risk Interventions Readmission Risk Prevention Plan 12/03/2019  Transportation Screening Complete  Medication Review [employer]) Complete  PCP or Specialist appointment within 3-5 days of discharge Complete  HRI or [place] Complete  SW Recovery Care/Counseling Consult Complete  [place] Not Applicable  Some recent data might be hidden

## 2019-12-05 NOTE — Plan of Care (Signed)
  Problem: Education: Goal: Ability to describe self-care measures that may prevent or decrease complications (Diabetes Survival Skills Education) will improve Outcome: Adequate for Discharge   Problem: Coping: Goal: Ability to adjust to condition or change in health will improve Outcome: Adequate for Discharge   Problem: Fluid Volume: Goal: Ability to maintain a balanced intake and output will improve Outcome: Adequate for Discharge   Problem: Health Behavior/Discharge Planning: Goal: Ability to identify and utilize available resources and services will improve Outcome: Adequate for Discharge Goal: Ability to manage health-related needs will improve Outcome: Adequate for Discharge   Problem: Education: Goal: Knowledge of General Education information will improve Description: Including pain rating scale, medication(s)/side effects and non-pharmacologic comfort measures Outcome: Adequate for Discharge   Problem: Health Behavior/Discharge Planning: Goal: Ability to manage health-related needs will improve Outcome: Adequate for Discharge   Problem: Clinical Measurements: Goal: Ability to maintain clinical measurements within normal limits will improve Outcome: Adequate for Discharge Goal: Will remain free from infection Outcome: Adequate for Discharge Goal: Diagnostic test results will improve Outcome: Adequate for Discharge Goal: Respiratory complications will improve Outcome: Adequate for Discharge Goal: Cardiovascular complication will be avoided Outcome: Adequate for Discharge   Problem: Activity: Goal: Risk for activity intolerance will decrease Outcome: Adequate for Discharge   Problem: Nutrition: Goal: Adequate nutrition will be maintained Outcome: Adequate for Discharge   Problem: Coping: Goal: Level of anxiety will decrease Outcome: Adequate for Discharge   Problem: Elimination: Goal: Will not experience complications related to bowel motility Outcome:  Adequate for Discharge Goal: Will not experience complications related to urinary retention Outcome: Adequate for Discharge   Problem: Pain Managment: Goal: General experience of comfort will improve Outcome: Adequate for Discharge   Problem: Safety: Goal: Ability to remain free from injury will improve Outcome: Adequate for Discharge   Problem: Skin Integrity: Goal: Risk for impaired skin integrity will decrease Outcome: Adequate for Discharge

## 2019-12-05 NOTE — Progress Notes (Signed)
D/C instructions given and reviewed. No questions asked but encouraged to call with concerns. Tele removed. IV team consulted for port de access.

## 2019-12-05 NOTE — Progress Notes (Signed)
Switch over pt standard drainage bag tubing to a leg bag. Wife at bedside and educated on how to switch over leg bag for during the day and how to attached the drainage bag at night. Patient and patient's wife verbalizes understanding. No further questions and volunteer has arrived to transport the patient and wife towards the main tower for discharge.

## 2019-12-06 LAB — PATHOLOGIST SMEAR REVIEW

## 2019-12-10 ENCOUNTER — Other Ambulatory Visit: Payer: Self-pay | Admitting: Family Medicine

## 2019-12-10 DIAGNOSIS — J189 Pneumonia, unspecified organism: Secondary | ICD-10-CM

## 2019-12-11 ENCOUNTER — Other Ambulatory Visit: Payer: Self-pay | Admitting: Internal Medicine

## 2019-12-11 DIAGNOSIS — J301 Allergic rhinitis due to pollen: Secondary | ICD-10-CM

## 2019-12-11 NOTE — Telephone Encounter (Addendum)
Pt is requesting refill on MONTELUKAST SOD 10 MG TABLET next ov 02/03/20. Pt was informed available for pick up at the pharmacy

## 2019-12-15 ENCOUNTER — Encounter (HOSPITAL_COMMUNITY): Payer: Self-pay | Admitting: Pulmonary Disease

## 2019-12-15 ENCOUNTER — Emergency Department (HOSPITAL_COMMUNITY): Payer: Medicare Other

## 2019-12-15 ENCOUNTER — Other Ambulatory Visit: Payer: Self-pay

## 2019-12-15 ENCOUNTER — Inpatient Hospital Stay (HOSPITAL_COMMUNITY)
Admission: EM | Admit: 2019-12-15 | Discharge: 2019-12-20 | DRG: 698 | Disposition: A | Discharge status: Home / Self Care | Payer: Medicare Other | Attending: Internal Medicine | Admitting: Internal Medicine

## 2019-12-15 DIAGNOSIS — J449 Chronic obstructive pulmonary disease, unspecified: Secondary | ICD-10-CM | POA: Diagnosis present

## 2019-12-15 DIAGNOSIS — K219 Gastro-esophageal reflux disease without esophagitis: Secondary | ICD-10-CM | POA: Diagnosis present

## 2019-12-15 DIAGNOSIS — D638 Anemia in other chronic diseases classified elsewhere: Secondary | ICD-10-CM | POA: Diagnosis present

## 2019-12-15 DIAGNOSIS — R6510 Systemic inflammatory response syndrome (SIRS) of non-infectious origin without acute organ dysfunction: Secondary | ICD-10-CM

## 2019-12-15 DIAGNOSIS — G9341 Metabolic encephalopathy: Secondary | ICD-10-CM | POA: Diagnosis present

## 2019-12-15 DIAGNOSIS — Z20822 Contact with and (suspected) exposure to covid-19: Secondary | ICD-10-CM | POA: Diagnosis present

## 2019-12-15 DIAGNOSIS — N3001 Acute cystitis with hematuria: Secondary | ICD-10-CM | POA: Diagnosis present

## 2019-12-15 DIAGNOSIS — E876 Hypokalemia: Secondary | ICD-10-CM | POA: Diagnosis present

## 2019-12-15 DIAGNOSIS — R4182 Altered mental status, unspecified: Secondary | ICD-10-CM

## 2019-12-15 DIAGNOSIS — A419 Sepsis, unspecified organism: Secondary | ICD-10-CM | POA: Diagnosis present

## 2019-12-15 DIAGNOSIS — Z856 Personal history of leukemia: Secondary | ICD-10-CM | POA: Diagnosis not present

## 2019-12-15 DIAGNOSIS — I959 Hypotension, unspecified: Secondary | ICD-10-CM | POA: Diagnosis not present

## 2019-12-15 DIAGNOSIS — F419 Anxiety disorder, unspecified: Secondary | ICD-10-CM | POA: Diagnosis present

## 2019-12-15 DIAGNOSIS — I252 Old myocardial infarction: Secondary | ICD-10-CM | POA: Diagnosis not present

## 2019-12-15 DIAGNOSIS — Z9221 Personal history of antineoplastic chemotherapy: Secondary | ICD-10-CM | POA: Diagnosis not present

## 2019-12-15 DIAGNOSIS — Z801 Family history of malignant neoplasm of trachea, bronchus and lung: Secondary | ICD-10-CM

## 2019-12-15 DIAGNOSIS — I517 Cardiomegaly: Secondary | ICD-10-CM | POA: Diagnosis not present

## 2019-12-15 DIAGNOSIS — D61818 Other pancytopenia: Secondary | ICD-10-CM | POA: Diagnosis present

## 2019-12-15 DIAGNOSIS — E785 Hyperlipidemia, unspecified: Secondary | ICD-10-CM | POA: Diagnosis present

## 2019-12-15 DIAGNOSIS — Z885 Allergy status to narcotic agent status: Secondary | ICD-10-CM

## 2019-12-15 DIAGNOSIS — D696 Thrombocytopenia, unspecified: Secondary | ICD-10-CM | POA: Diagnosis present

## 2019-12-15 DIAGNOSIS — Y929 Unspecified place or not applicable: Secondary | ICD-10-CM

## 2019-12-15 DIAGNOSIS — I110 Hypertensive heart disease with heart failure: Secondary | ICD-10-CM | POA: Diagnosis present

## 2019-12-15 DIAGNOSIS — T83518A Infection and inflammatory reaction due to other urinary catheter, initial encounter: Secondary | ICD-10-CM | POA: Diagnosis present

## 2019-12-15 DIAGNOSIS — J189 Pneumonia, unspecified organism: Secondary | ICD-10-CM

## 2019-12-15 DIAGNOSIS — R338 Other retention of urine: Secondary | ICD-10-CM | POA: Diagnosis not present

## 2019-12-15 DIAGNOSIS — I5032 Chronic diastolic (congestive) heart failure: Secondary | ICD-10-CM | POA: Diagnosis present

## 2019-12-15 DIAGNOSIS — I2510 Atherosclerotic heart disease of native coronary artery without angina pectoris: Secondary | ICD-10-CM | POA: Diagnosis present

## 2019-12-15 DIAGNOSIS — N390 Urinary tract infection, site not specified: Secondary | ICD-10-CM | POA: Diagnosis not present

## 2019-12-15 DIAGNOSIS — E1151 Type 2 diabetes mellitus with diabetic peripheral angiopathy without gangrene: Secondary | ICD-10-CM | POA: Diagnosis present

## 2019-12-15 DIAGNOSIS — Z8673 Personal history of transient ischemic attack (TIA), and cerebral infarction without residual deficits: Secondary | ICD-10-CM

## 2019-12-15 DIAGNOSIS — Z8616 Personal history of COVID-19: Secondary | ICD-10-CM

## 2019-12-15 DIAGNOSIS — R6521 Severe sepsis with septic shock: Secondary | ICD-10-CM | POA: Diagnosis present

## 2019-12-15 DIAGNOSIS — Z79899 Other long term (current) drug therapy: Secondary | ICD-10-CM

## 2019-12-15 DIAGNOSIS — Z87891 Personal history of nicotine dependence: Secondary | ICD-10-CM | POA: Diagnosis not present

## 2019-12-15 DIAGNOSIS — Z8249 Family history of ischemic heart disease and other diseases of the circulatory system: Secondary | ICD-10-CM

## 2019-12-15 DIAGNOSIS — Z888 Allergy status to other drugs, medicaments and biological substances status: Secondary | ICD-10-CM

## 2019-12-15 DIAGNOSIS — C9110 Chronic lymphocytic leukemia of B-cell type not having achieved remission: Secondary | ICD-10-CM | POA: Diagnosis not present

## 2019-12-15 LAB — URINALYSIS, ROUTINE W REFLEX MICROSCOPIC
Bilirubin Urine: NEGATIVE
Glucose, UA: NEGATIVE mg/dL
Ketones, ur: NEGATIVE mg/dL
Nitrite: NEGATIVE
Protein, ur: NEGATIVE mg/dL
Specific Gravity, Urine: 1.011 (ref 1.005–1.030)
pH: 5 (ref 5.0–8.0)

## 2019-12-15 LAB — I-STAT ARTERIAL BLOOD GAS, ED
Acid-Base Excess: 3 mmol/L — ABNORMAL HIGH (ref 0.0–2.0)
Bicarbonate: 25.6 mmol/L (ref 20.0–28.0)
Calcium, Ion: 1.11 mmol/L — ABNORMAL LOW (ref 1.15–1.40)
HCT: 24 % — ABNORMAL LOW (ref 39.0–52.0)
Hemoglobin: 8.2 g/dL — ABNORMAL LOW (ref 13.0–17.0)
O2 Saturation: 97 %
Potassium: 4.3 mmol/L (ref 3.5–5.1)
Sodium: 136 mmol/L (ref 135–145)
TCO2: 26 mmol/L (ref 22–32)
pCO2 arterial: 29.8 mmHg — ABNORMAL LOW (ref 32.0–48.0)
pH, Arterial: 7.541 — ABNORMAL HIGH (ref 7.350–7.450)
pO2, Arterial: 78 mmHg — ABNORMAL LOW (ref 83.0–108.0)

## 2019-12-15 LAB — CBC WITH DIFFERENTIAL/PLATELET
Abs Immature Granulocytes: 0.35 10*3/uL — ABNORMAL HIGH (ref 0.00–0.07)
Basophils Absolute: 0 10*3/uL (ref 0.0–0.1)
Basophils Relative: 0 %
Eosinophils Absolute: 0 10*3/uL (ref 0.0–0.5)
Eosinophils Relative: 0 %
HCT: 38.6 % — ABNORMAL LOW (ref 39.0–52.0)
Hemoglobin: 11.9 g/dL — ABNORMAL LOW (ref 13.0–17.0)
Immature Granulocytes: 6 %
Lymphocytes Relative: 23 %
Lymphs Abs: 1.3 10*3/uL (ref 0.7–4.0)
MCH: 32.9 pg (ref 26.0–34.0)
MCHC: 30.8 g/dL (ref 30.0–36.0)
MCV: 106.6 fL — ABNORMAL HIGH (ref 80.0–100.0)
Monocytes Absolute: 0.5 10*3/uL (ref 0.1–1.0)
Monocytes Relative: 9 %
Neutro Abs: 3.4 10*3/uL (ref 1.7–7.7)
Neutrophils Relative %: 62 %
Platelets: 62 10*3/uL — ABNORMAL LOW (ref 150–400)
RBC: 3.62 MIL/uL — ABNORMAL LOW (ref 4.22–5.81)
RDW: 16.8 % — ABNORMAL HIGH (ref 11.5–15.5)
WBC: 5.5 10*3/uL (ref 4.0–10.5)
nRBC: 0 % (ref 0.0–0.2)

## 2019-12-15 LAB — MRSA PCR SCREENING: MRSA by PCR: NEGATIVE

## 2019-12-15 LAB — COMPREHENSIVE METABOLIC PANEL
ALT: 11 U/L (ref 0–44)
AST: 19 U/L (ref 15–41)
Albumin: 2.6 g/dL — ABNORMAL LOW (ref 3.5–5.0)
Alkaline Phosphatase: 97 U/L (ref 38–126)
Anion gap: 10 (ref 5–15)
BUN: 11 mg/dL (ref 8–23)
CO2: 24 mmol/L (ref 22–32)
Calcium: 8.3 mg/dL — ABNORMAL LOW (ref 8.9–10.3)
Chloride: 105 mmol/L (ref 98–111)
Creatinine, Ser: 0.88 mg/dL (ref 0.61–1.24)
GFR, Estimated: >60 mL/min (ref >60)
Glucose, Bld: 95 mg/dL (ref 70–99)
Potassium: 5.1 mmol/L (ref 3.5–5.1)
Sodium: 139 mmol/L (ref 135–145)
Total Bilirubin: 0.9 mg/dL (ref 0.3–1.2)
Total Protein: 4.8 g/dL — ABNORMAL LOW (ref 6.5–8.1)

## 2019-12-15 LAB — BLOOD GAS, VENOUS
Acid-Base Excess: 2 mmol/L (ref 0.0–2.0)
Bicarbonate: 26.2 mmol/L (ref 20.0–28.0)
Drawn by: 164
FIO2: 28
O2 Saturation: 91.9 %
Patient temperature: 37.9
pCO2, Ven: 43.9 mmHg — ABNORMAL LOW (ref 44.0–60.0)
pH, Ven: 7.398 (ref 7.250–7.430)
pO2, Ven: 64.6 mmHg — ABNORMAL HIGH (ref 32.0–45.0)

## 2019-12-15 LAB — LACTIC ACID, PLASMA
Lactic Acid, Venous: 1.2 mmol/L (ref 0.5–1.9)
Lactic Acid, Venous: 0.8 mmol/L (ref 0.5–1.9)
Lactic Acid, Venous: 6 mmol/L (ref 0.5–1.9)
Lactic Acid, Venous: 1.9 mmol/L (ref 0.5–1.9)

## 2019-12-15 LAB — PROCALCITONIN: Procalcitonin: 1.66 ng/mL

## 2019-12-15 LAB — PROTIME-INR
INR: 1.2 (ref 0.8–1.2)
Prothrombin Time: 14.7 seconds (ref 11.4–15.2)

## 2019-12-15 LAB — GLUCOSE, CAPILLARY
Glucose-Capillary: 96 mg/dL (ref 70–99)
Glucose-Capillary: 264 mg/dL — ABNORMAL HIGH (ref 70–99)

## 2019-12-15 LAB — RESP PANEL BY RT PCR (RSV, FLU A&B, COVID)
Influenza A by PCR: NEGATIVE
Influenza B by PCR: NEGATIVE
Respiratory Syncytial Virus by PCR: NEGATIVE
SARS Coronavirus 2 by RT PCR: NEGATIVE

## 2019-12-15 LAB — APTT: aPTT: 27 seconds (ref 24–36)

## 2019-12-15 LAB — BRAIN NATRIURETIC PEPTIDE: B Natriuretic Peptide: 203.2 pg/mL — ABNORMAL HIGH (ref 0.0–100.0)

## 2019-12-15 LAB — CBG MONITORING, ED
Glucose-Capillary: 88 mg/dL (ref 70–99)
Glucose-Capillary: 83 mg/dL (ref 70–99)

## 2019-12-15 MED ORDER — LACTATED RINGERS IV SOLN: INTRAVENOUS | Status: DC

## 2019-12-15 MED ORDER — SODIUM CHLORIDE 0.9% FLUSH
10.0000 mL | INTRAVENOUS | Status: DC | PRN
  Administered 2019-12-20: 10 mL

## 2019-12-15 MED ORDER — HYDROCODONE-ACETAMINOPHEN 10-325 MG PO TABS
1.0000 | ORAL_TABLET | Freq: Four times a day (QID) | ORAL | Status: DC | PRN
  Administered 2019-12-15 – 2019-12-20 (×10): 1 via ORAL
  Filled 2019-12-15 (×10): qty 1

## 2019-12-15 MED ORDER — LACTATED RINGERS IV BOLUS (SEPSIS): 1000.0000 mL | Freq: Once | INTRAVENOUS | Status: DC

## 2019-12-15 MED ORDER — ALBUTEROL SULFATE HFA 108 (90 BASE) MCG/ACT IN AERS
4.0000 | INHALATION_SPRAY | Freq: Once | RESPIRATORY_TRACT | Status: DC
  Filled 2019-12-15: qty 6.7

## 2019-12-15 MED ORDER — HYDROCORTISONE NA SUCCINATE PF 100 MG IJ SOLR
50.0000 mg | Freq: Four times a day (QID) | INTRAMUSCULAR | Status: DC
  Administered 2019-12-15 – 2019-12-16 (×3): 50 mg via INTRAVENOUS
  Filled 2019-12-15 (×3): qty 2

## 2019-12-15 MED ORDER — TAMSULOSIN HCL 0.4 MG PO CAPS
0.4000 mg | ORAL_CAPSULE | Freq: Every day | ORAL | Status: DC
  Administered 2019-12-15 – 2019-12-20 (×6): 0.4 mg via ORAL
  Filled 2019-12-15 (×6): qty 1

## 2019-12-15 MED ORDER — MONTELUKAST SODIUM 10 MG PO TABS
10.0000 mg | ORAL_TABLET | Freq: Every day | ORAL | Status: DC
  Administered 2019-12-15 – 2019-12-19 (×5): 10 mg via ORAL
  Filled 2019-12-15 (×7): qty 1

## 2019-12-15 MED ORDER — FLUTICASONE FUROATE-VILANTEROL 200-25 MCG/INH IN AEPB
1.0000 | INHALATION_SPRAY | Freq: Every day | RESPIRATORY_TRACT | Status: DC
  Administered 2019-12-17 – 2019-12-20 (×4): 1 via RESPIRATORY_TRACT
  Filled 2019-12-15: qty 28

## 2019-12-15 MED ORDER — FAMOTIDINE 20 MG PO TABS
20.0000 mg | ORAL_TABLET | Freq: Two times a day (BID) | ORAL | Status: DC
  Administered 2019-12-15 – 2019-12-18 (×7): 20 mg via ORAL
  Filled 2019-12-15 (×7): qty 1

## 2019-12-15 MED ORDER — LACTATED RINGERS IV BOLUS (SEPSIS)
500.0000 mL | Freq: Once | INTRAVENOUS | Status: AC
  Administered 2019-12-15: 500 mL via INTRAVENOUS

## 2019-12-15 MED ORDER — PREGABALIN 75 MG PO CAPS
200.0000 mg | ORAL_CAPSULE | Freq: Three times a day (TID) | ORAL | Status: DC
  Administered 2019-12-15 – 2019-12-20 (×16): 200 mg via ORAL
  Filled 2019-12-15 (×2): qty 1
  Filled 2019-12-15 (×4): qty 2
  Filled 2019-12-15 (×3): qty 1
  Filled 2019-12-15 (×3): qty 2
  Filled 2019-12-15: qty 1
  Filled 2019-12-15 (×3): qty 2

## 2019-12-15 MED ORDER — VANCOMYCIN HCL 1500 MG/300ML IV SOLN
1500.0000 mg | Freq: Once | INTRAVENOUS | Status: AC
  Administered 2019-12-15: 1500 mg via INTRAVENOUS
  Filled 2019-12-15: qty 300

## 2019-12-15 MED ORDER — DOCUSATE SODIUM 100 MG PO CAPS: 100.0000 mg | ORAL_CAPSULE | Freq: Two times a day (BID) | ORAL | Status: DC | PRN

## 2019-12-15 MED ORDER — SODIUM CHLORIDE 0.9 % IV SOLN
2.0000 g | Freq: Three times a day (TID) | INTRAVENOUS | Status: DC
  Administered 2019-12-15 – 2019-12-20 (×15): 2 g via INTRAVENOUS
  Filled 2019-12-15 (×19): qty 2

## 2019-12-15 MED ORDER — CHLORHEXIDINE GLUCONATE CLOTH 2 % EX PADS
6.0000 | MEDICATED_PAD | Freq: Every day | CUTANEOUS | Status: DC
  Administered 2019-12-16 – 2019-12-20 (×5): 6 via TOPICAL

## 2019-12-15 MED ORDER — SODIUM CHLORIDE 0.9% FLUSH
10.0000 mL | Freq: Two times a day (BID) | INTRAVENOUS | Status: DC
  Administered 2019-12-15 – 2019-12-19 (×8): 10 mL

## 2019-12-15 MED ORDER — METRONIDAZOLE IN NACL 5-0.79 MG/ML-% IV SOLN
500.0000 mg | Freq: Once | INTRAVENOUS | Status: AC
  Administered 2019-12-15: 500 mg via INTRAVENOUS
  Filled 2019-12-15: qty 100

## 2019-12-15 MED ORDER — INSULIN ASPART 100 UNIT/ML ~~LOC~~ SOLN: 0.0000 [IU] | Freq: Three times a day (TID) | SUBCUTANEOUS | Status: DC

## 2019-12-15 MED ORDER — NOREPINEPHRINE 16 MG/250ML-% IV SOLN
0.0000 ug/min | INTRAVENOUS | Status: DC
  Administered 2019-12-15: 4 ug/min via INTRAVENOUS
  Administered 2019-12-16: 1 ug/min via INTRAVENOUS
  Filled 2019-12-15 (×2): qty 250

## 2019-12-15 MED ORDER — LACTATED RINGERS IV BOLUS
1000.0000 mL | Freq: Once | INTRAVENOUS | Status: AC
  Administered 2019-12-15: 1000 mL via INTRAVENOUS

## 2019-12-15 MED ORDER — POLYETHYLENE GLYCOL 3350 17 G PO PACK: 17.0000 g | PACK | Freq: Every day | ORAL | Status: DC | PRN

## 2019-12-15 MED ORDER — ALBUTEROL SULFATE HFA 108 (90 BASE) MCG/ACT IN AERS
2.0000 | INHALATION_SPRAY | Freq: Four times a day (QID) | RESPIRATORY_TRACT | Status: DC | PRN
  Administered 2019-12-16 – 2019-12-17 (×2): 2 via RESPIRATORY_TRACT
  Filled 2019-12-15: qty 6.7

## 2019-12-15 MED ORDER — HYDROCODONE-ACETAMINOPHEN 10-325 MG PO TABS: 1.0000 | ORAL_TABLET | ORAL | Status: DC

## 2019-12-15 MED ORDER — SODIUM CHLORIDE 0.9 % IV SOLN
2.0000 g | Freq: Once | INTRAVENOUS | Status: AC
  Administered 2019-12-15: 2 g via INTRAVENOUS
  Filled 2019-12-15: qty 2

## 2019-12-15 MED ORDER — ACETAMINOPHEN 500 MG PO TABS
1000.0000 mg | ORAL_TABLET | Freq: Once | ORAL | Status: AC
  Administered 2019-12-15: 1000 mg via ORAL
  Filled 2019-12-15: qty 2

## 2019-12-15 MED ORDER — NOREPINEPHRINE 4 MG/250ML-% IV SOLN
0.0000 ug/min | INTRAVENOUS | Status: DC
  Administered 2019-12-15: 2 ug/min via INTRAVENOUS
  Filled 2019-12-15: qty 250

## 2019-12-15 MED ORDER — LACTATED RINGERS IV BOLUS (SEPSIS): 500.0000 mL | Freq: Once | INTRAVENOUS | Status: DC

## 2019-12-15 MED ORDER — VANCOMYCIN HCL IN DEXTROSE 1-5 GM/200ML-% IV SOLN: 1000.0000 mg | Freq: Once | INTRAVENOUS | Status: DC

## 2019-12-15 MED ORDER — INSULIN ASPART 100 UNIT/ML ~~LOC~~ SOLN: 0.0000 [IU] | Freq: Every day | SUBCUTANEOUS | Status: DC

## 2019-12-15 MED ORDER — HYDROCORTISONE NA SUCCINATE PF 100 MG IJ SOLR
100.0000 mg | Freq: Once | INTRAMUSCULAR | Status: AC
  Administered 2019-12-15: 100 mg via INTRAVENOUS
  Filled 2019-12-15: qty 2

## 2019-12-15 NOTE — Progress Notes (Signed)
Notified bedside nurse of need to administer fluid bolus.  

## 2019-12-15 NOTE — Progress Notes (Signed)
Pharmacy Antibiotic Note  Ryan Copeland is a 69 y.o. male admitted on 12/15/2019 with Sepsis, likely UTI with indwelling Foley.  Pharmacy has been consulted for Cefepime dosing.  Plan: Cefepime 2g IV every 8 hours Monitor renal function, Cx and clinical progression to narrow  Height: 6' (182.9 cm) Weight: 75 kg (165 lb 5.5 oz) IBW/kg (Calculated) : 77.6  Temp (24hrs), Avg:100.1 F (37.8 C), Min:98.2 F (36.8 C), Max:102.1 F (38.9 C)  Recent Labs  Lab 12/15/19 0647  WBC 5.5  CREATININE 0.88  LATICACIDVEN 1.2    Estimated Creatinine Clearance: 85.2 mL/min (by C-G formula based on SCr of 0.88 mg/dL).    Allergies  Allergen Reactions  . Privigen [Immune Globulin] Hives    Pt reports hives/rash on body with IV Privigen. He was changed to IV Gamunex-C and had many infusions without adverse side effects.  . Codeine Hives    Pt states he can take a few, more reaction with extended doses.    Bertis Ruddy, PharmD Clinical Pharmacist ED Pharmacist Phone # [phone] 12/15/2019 11:40 AM

## 2019-12-15 NOTE — Progress Notes (Signed)
eLink monitoring this code sepsis. Thank you

## 2019-12-15 NOTE — Progress Notes (Signed)
MD Sood notified about pt's sharp increase in lactic acid from .8 to 6.0.

## 2019-12-15 NOTE — ED Triage Notes (Signed)
Pt arrived by EMS from home. Wife called concerned for AMS, states pt was hallucinating and climbing out of bed.  Pt was admitted 2wks ago for pneumonia, wife states pt has had no improvement since returning home  Medics report BP 80s/40s, gave 1L NS. Tachy in 120s   Pt oriented to person, place and situation but not time. Able to follow commands at this time, lethargic

## 2019-12-15 NOTE — H&P (Addendum)
NAME:  Ryan Copeland, MRN:  [id number], DOB:  [birth year], LOS: 0 ADMISSION DATE:  12/15/2019, CONSULTATION DATE:  10/31 REFERRING MD:  Tivis Ringer, CHIEF COMPLAINT:  Sepsis   Brief History   69 year old male with history of CLL being maintained on acalabrutinib, just discharged from the hospital 10/21 after being treated for pneumonia and sepsis, course was complicated by urinary retention for which he was discharged with Foley.  He was seen by urology on 10/28 for voiding trials which were unsuccessful.  Foley catheter was replaced.  Presents 10/31 with SIRS/sepsis/septic shock presumptively urinary tract source critical care asked to admit.  History of present illness   This is a 69 year old male patient with history as mentioned below.  Just discharged from the hospital on 10/21 after being admitted for sepsis, pneumonia, thrombocytopenia, and encephalopathy.  Hospital course complicated by urinary retention for which he was sent home with urinary catheter.  In regards to his Malignancy he is typically followed by Dr. Alvy Bimler, it appears as though his last visit was on 9/10 with notes of being observation on acalabrutinib.  He was discharged home, had noted follow-up at urology where he underwent unsuccessful voiding trials on 10/28 and therefore his Foley catheter was replaced with notable hematuria following this visit.  He presents to the emergency room on 10/31 after wife called EMS reporting confusion and altered sensorium.  On arrival to the emergency room his systolic blood pressure was in the 80s he was given 1 L of crystalloid, cultures were sent, and he was evaluated possible admission.  Further questioning, the wife notes low-grade fever for started on the evening of 10/30, fevers have been higher on 10/31 with patient more confused and exhibiting generalized weakness.  Apparently was placed on doxycycline for discomfort at the tip of his penis during the visit at urology.  While in the emergency  room empiric antibiotics initiated.  He had been receiving 30 mL/kg predicted body weight IV fluids and in spite of this had persistent hypotension with systolic blood pressure in the 70s and ongoing confusion and because of this critical care was asked to admit.  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  Recovered from COVID-19 infection September 2021. -> Most recently discharged from hospital on 10/21 after being admitted for working diagnosis of sepsis in the setting of community-acquired pneumonia complicated by metabolic encephalopathy which resolved, thrombocytopenia, leukopenia, and acute urinary retention for which she was sent home with urinary catheter  Significant Hospital Events   10/31 admitted w/ w-dx urosepsis  Consults:    Procedures:    Significant Diagnostic Tests:    Micro Data:  Urine 10/31>>> Blood 10/31>>>  Antimicrobials:  Cefepime 10/31>>>  Interim history/subjective:  Still hypotensive now on pressors   Objective   Blood pressure (Abnormal) 72/47, pulse 99, temperature 100.1 F (37.8 C), temperature source Oral, resp. rate 14, height 6' (1.829 m), weight 75 kg, SpO2 98 %.        Intake/Output Summary (Last 24 hours) at 12/15/2019 1022 Last data filed at 12/15/2019 0850 Gross per 24 hour  Intake 600 ml  Output no documentation  Net 600 ml   Filed Weights   12/15/19 0722 12/15/19 0723  Weight: 78.7 kg 75 kg    Examination: General: 69 year old chronically ill male resting on medical stretcher HENT: NCAT  Lungs: clear  Cardiovascular: RRR no MRG Abdomen: soft  Extremities: warm  and dry. Some pitting edema LEs brisk CR good pulses. Scattered areas of ecchymosis  Neuro: awake and now oriented but affect is flat.  GU: cnc yellow   Resolved Hospital Problem list     Assessment & Plan:  Severe sepsis septic shock; presumptively UT source in setting  of indwelling Foley catheter (chronic for Urinary retention) Plan Cont IVFs F/u cultures IV cefepime Admit to ICU  Repeat LA Titrate Norepi for SBP > 90 Hold antihypertensives Already got solucortef so will continue stress dose steroids  Acute metabolic encephalopathy Plan Supportive care  Anemia of chronic disease Plan Trend cbc Transfuse for hgb < 7  Thrombocytopenia->actually improved from nadar last hospital admit Plan scds  H/o CLL Plan Hold acalabrutinib Will need to notify onc this week of admit  Urinary retention -foley just replaced 10/28 Plan Keep foley  H/o COPD, recent PNA  pcxr personally reviewed w/ improved aeration on left lung airspace disease  Plan Cont Home BDs  DM Plan Holding metformin ssi starting as being placed on stress dose steroids Best practice:  Diet: advance as tol Pain/Anxiety/Delirium protocol (if indicated): NA VAP protocol (if indicated): NA DVT prophylaxis: scd GI prophylaxis: NA Glucose control: ssi Mobility: BR Code Status: full code  Family Communication: wife [name] [phone] has requested daily call if no face to face. (spoke at length in ER 10/31) Disposition: to ICU   Labs   CBC: Recent Labs  Lab 12/15/19 0647 12/15/19 1005  WBC 5.5  --   NEUTROABS 3.4  --   HGB 11.9* 8.2*  HCT 38.6* 24.0*  MCV 106.6*  --   PLT 62*  --     Basic Metabolic Panel: Recent Labs  Lab 12/15/19 0647 12/15/19 1005  NA 139 136  K 5.1 4.3  CL 105  --   CO2 24  --   GLUCOSE 95  --   BUN 11  --   CREATININE 0.88  --   CALCIUM 8.3*  --    GFR: Estimated Creatinine Clearance: 85.2 mL/min (by C-G formula based on SCr of 0.88 mg/dL). Recent Labs  Lab 12/15/19 0647  WBC 5.5  LATICACIDVEN 1.2    Liver Function Tests: Recent Labs  Lab 12/15/19 0647  AST 19  ALT 11  ALKPHOS 97  BILITOT 0.9  PROT 4.8*  ALBUMIN 2.6*   No results for input(s): LIPASE, AMYLASE in the last 168 hours. No results for input(s):  AMMONIA in the last 168 hours.  ABG    Component Value Date/Time   PHART 7.541 (H) 12/15/2019 1005   PCO2ART 29.8 (L) 12/15/2019 1005   PO2ART 78 (L) 12/15/2019 1005   HCO3 25.6 12/15/2019 1005   TCO2 26 12/15/2019 1005   O2SAT 97.0 12/15/2019 1005     Coagulation Profile: Recent Labs  Lab 12/15/19 0647  INR 1.2    Cardiac Enzymes: No results for input(s): CKTOTAL, CKMB, CKMBINDEX, TROPONINI in the last 168 hours.  HbA1C: Hgb A1c MFr Bld  Date/Time Value Ref Range Status  11/28/2019 07:22 PM 5.1 4.8 - 5.6 % Final    Comment:    (NOTE)         Prediabetes: 5.7 - 6.4         Diabetes: >6.4         Glycemic control for adults with diabetes: <7.0   04/13/2017 08:23 AM 6.2 (H) 4.8 - 5.6 % Final    Comment:             Prediabetes:  5.7 - 6.4          Diabetes: >6.4          Glycemic control for adults with diabetes: <7.0     CBG: Recent Labs  Lab 12/15/19 0642  GLUCAP 88    Review of Systems:   Review of Systems  Constitutional: Positive for fever and malaise/fatigue.  HENT: Negative.   Eyes: Negative.   Respiratory: Negative.   Cardiovascular: Negative.   Gastrointestinal: Negative.   Genitourinary: Positive for dysuria and hematuria.  Musculoskeletal: Negative.   Skin: Negative.   Neurological: Positive for speech change, loss of consciousness and weakness.  Endo/Heme/Allergies: Bruises/bleeds easily.  Psychiatric/Behavioral: Negative.      Past Medical History  He,  has a past medical history of Anxiety (01/30/2014), Back injury, CAD (coronary artery disease), CLL (chronic lymphocytic leukemia) ([place]) (03/18/2011), COPD (chronic obstructive pulmonary disease) ([place]), Diabetes mellitus ([place]), ECRB (extensor carpi radialis brevis) tenosynovitis, GERD (gastroesophageal reflux disease), Hyperlipidemia, Lateral epicondylitis of left elbow, MI, acute, non ST segment elevation ([place]) (06/28/2009), Neuromuscular disorder ([place]), PVD (peripheral vascular disease) ([place]),  and Tobacco abuse.   Surgical History    Past Surgical History:  Procedure Laterality Date  . ADENOIDECTOMY  1955  . CARDIAC CATHETERIZATION    . CARDIAC CATHETERIZATION N/A 09/26/2014   Procedure: Left Heart Cath and Coronary Angiography;  Surgeon: Brendalee Matthies M Martinique, MD;  Location: [place] [facility];  Service: Cardiovascular;  Laterality: N/A;  . carpel tunnel release Left 04-1989  . carpel tunnel release  Right 01-1989  . CHOLECYSTECTOMY  2007  . CORONARY STENT PLACEMENT  May 2011  . femoral stents    . IR CV LINE INJECTION  08/18/2017  . IR CV LINE INJECTION  09/01/2017  . IR CV LINE INJECTION  02/02/2018  . LATERAL EPICONDYLE RELEASE Left 02/12/2014   Procedure: LEFT ELBOW DEBRIDEMENT WITH TENDON REPAIR ;  Surgeon: Lorn Junes, MD;  Location: [facility];  Service: Orthopedics;  Laterality: Left;  . LEFT CAI STENT/PTA AND POPLITEAL ARTERY/TIBIAL THROMBECTOMY     . LEFT HEART CATHETERIZATION WITH CORONARY ANGIOGRAM N/A 08/26/2011   Procedure: LEFT HEART CATHETERIZATION WITH CORONARY ANGIOGRAM;  Surgeon: Quashon Jesus M Martinique, MD;  Location: [facility];  Service: Cardiovascular;  Laterality: N/A;  . PERIPHERAL VASCULAR CATHETERIZATION N/A 01/01/2015   Procedure: Abdominal Aortogram;  Surgeon: Conrad Wellington, MD;  Location: [place] [facility];  Service: Cardiovascular;  Laterality: N/A;  . TARSAL TUNNEL RELEASE Bilateral 08-2007     Social History   reports that he quit smoking about 3 years ago. His smoking use included cigarettes. He has a 44.00 pack-year smoking history. He has quit using smokeless tobacco. He reports that he does not drink alcohol and does not use drugs.   Family History   His family history includes Cancer in his mother; Heart disease in his father; Heart failure (age of onset: 58) in his father; Lung cancer (age of onset: 63) in his mother.   Allergies Allergies  Allergen Reactions  . Privigen [Immune Globulin] Hives    Pt reports hives/rash on body with IV Privigen. He  was changed to IV Gamunex-C and had many infusions without adverse side effects.  . Codeine Hives    Pt states he can take a few, more reaction with extended doses.     Home Medications  Prior to Admission medications   Medication Sig Start Date End Date Taking? Authorizing Provider  acyclovir (ZOVIRAX) 400 MG tablet TAKE 1 TABLET BY  MOUTH TWICE A DAY Patient taking differently: Take 400 mg by mouth 2 (two) times daily.  07/24/19   [name], MD  albuterol (PROVENTIL HFA) 108 (90 Base) MCG/ACT inhaler TAKE 2 PUFFS BY MOUTH EVERY 6 HOURS AS NEEDED FOR WHEEZE OR SHORTNESS OF BREATH Patient taking differently: Inhale 2 puffs into the lungs every 6 (six) hours as needed for wheezing or shortness of breath. TAKE 2 PUFFS BY MOUTH EVERY 6 HOURS AS NEEDED FOR WHEEZE OR SHORTNESS OF BREATH 06/27/19   Spero Geralds, MD  allopurinol (ZYLOPRIM) 300 MG tablet TAKE 1 TABLET BY MOUTH EVERY DAY Patient taking differently: Take 300 mg by mouth daily.  09/23/19   [name], MD  ALPRAZolam Duanne Moron) 0.25 MG tablet Take 0.5 mg by mouth as needed for anxiety.     [provider]  amoxicillin-clavulanate (AUGMENTIN) 875-125 MG tablet Take 1 tablet by mouth every 12 (twelve) hours. 12/05/19   Charlynne Cousins, MD  b complex vitamins tablet Take 1 tablet by mouth daily.     [provider]  benzonatate (TESSALON) 100 MG capsule Take 2 capsules (200 mg total) by mouth 3 (three) times daily as needed for cough. 11/14/19   Spero Geralds, MD  CALQUENCE 100 MG capsule TAKE 100 MG BY MOUTH 2 (TWO) TIMES DAILY. Patient taking differently: Take 100 mg by mouth 2 (two) times daily.  04/11/19   [name], MD  Cholecalciferol (VITAMIN D-1000 MAX ST) 1000 units tablet Take 1,000 Units by mouth daily.     [provider]  famotidine (PEPCID) 20 MG tablet TAKE 1 TABLET BY MOUTH TWICE A DAY Patient taking differently: Take 20 mg by mouth 2 (two) times daily as needed for heartburn.  02/11/19    [name], MD  fluticasone furoate-vilanterol (BREO ELLIPTA) 200-25 MCG/INH AEPB Inhale 1 puff into the lungs daily. 06/27/19   Spero Geralds, MD  furosemide (LASIX) 20 MG tablet TAKE 1 TABLET BY MOUTH EVERY DAY AS NEEDED Patient taking differently: Take 20 mg by mouth daily as needed for fluid or edema.  04/23/19   Burtis Junes, NP  HYDROcodone-acetaminophen (NORCO) 10-325 MG tablet Take 1 tablet by mouth See admin instructions. Every 6 to 8 hours as needed for pain 04/14/15   [provider]  HYDROcodone-homatropine (HYCODAN) 5-1.5 MG/5ML syrup Take 5 mLs by mouth every 6 (six) hours as needed for cough. 11/21/19   [provider]  lidocaine-prilocaine (EMLA) cream APPLY TO AFFECTED AREA ONCE Patient taking differently: Apply 1 application topically once.  12/21/17   Nicholas Lose, MD  loperamide (IMODIUM) 2 MG capsule Take 2 capsules (4 mg total) by mouth every 6 (six) hours as needed for diarrhea or loose stools. 10/25/19   [name], MD  meclizine (ANTIVERT) 12.5 MG tablet Take 12.5 mg by mouth every 6 (six) hours as needed for dizziness.  12/24/14   [provider]  metFORMIN (GLUCOPHAGE-XR) 500 MG 24 hr tablet Take 500-1,000 mg by mouth See admin instructions. Takes one tablet (500mg )  in AM and 2 tablets (1000mg ) at night.    [provider]  mometasone (NASONEX) 50 MCG/ACT nasal spray Place 2 sprays into the nose daily. 11/08/19   Spero Geralds, MD  montelukast (SINGULAIR) 10 MG tablet TAKE 1 TABLET BY MOUTH EVERY DAY 12/11/19   Spero Geralds, MD  nitroGLYCERIN (NITROSTAT) 0.4 MG SL tablet PLACE 1 TABLET UNDER THE TONGUE EVERY 5 MINUTES X 3 DOSES AS NEEDED FOR CHEST PAIN *  MAX 3 DOSES* Patient taking differently: Place 0.4 mg under the tongue See admin instructions. PLACE 1 TABLET UNDER THE TONGUE EVERY 5 MINUTES X 3 DOSES AS NEEDED FOR CHEST PAIN *MAX 3 DOSES* 10/03/19   Burtis Junes, NP  predniSONE (DELTASONE) 20 MG tablet Take 20 mg by mouth See  admin instructions. Take 1 tablet (20mg ) by mouth three times daily for 3 days, then take 1 tablet (20mg ) by mouth twice daily for 3 days, then take 1 tablet by mouth daily for 3 days. 11/20/19   [provider]  pregabalin (LYRICA) 200 MG capsule TAKE 1 CAPSULE (200 MG TOTAL) BY MOUTH 3 (THREE) TIMES DAILY. 09/02/19   Suzzanne Cloud, NP  rosuvastatin (CRESTOR) 5 MG tablet TAKE 1 TABLET BY MOUTH EVERY DAY Patient taking differently: Take 5 mg by mouth daily.  07/24/19   Burtis Junes, NP  tamsulosin (FLOMAX) 0.4 MG CAPS capsule Take 1 capsule (0.4 mg total) by mouth daily. 12/06/19   Charlynne Cousins, MD  zolpidem (AMBIEN) 10 MG tablet Take 10 mg by mouth at bedtime as needed for sleep.    [provider]     Critical care time: 32 min     Erick Colace ACNP-BC [place] Pager # [phone] OR # [phone] if no answer

## 2019-12-15 NOTE — ED Provider Notes (Addendum)
[facility] EMERGENCY DEPARTMENT Provider Note   CSN: [id number] Arrival date & time: 12/15/19  0932     History Chief Complaint  Patient presents with  . Altered Mental Status  . Hypotension    Ryan Copeland is a 69 y.o. male.  HPI   69 year old male with a history of anxiety, CAD, CLL, COPD, diabetes, GERD, hyperlipidemia, NSTEMI, who presents to the emergency department today for evaluation of altered mental status.  Patient coming via EMS and additional history obtained from them. They report that the patient was recently discharged from the hospital and at that time was treated for pneumonia. Wife reported that he has not had any improvement since discharge and he was more altered and hallucinating this morning. On arrival his BP was in the 35T systolic. He was given a liter of fluids in route with no change in BP. They started a second liter of fluids and BP improved to the 90s. He has had about 200 cc of the second liter.  Pt states he is feeling well at this time. He is somewhat confused so there is a level 5 caveat that applies.  7:02 AM Additional history obtained from the patients wife. She states that pt started having low grade fevers last night. Today fevers have been higher, pt has been confused, and had generalized weakness. She further notes that he was seen at the urologist earlier this week and had a void trial which he failed so Foley was replaced. He does have some hematuria following this which is since resolved. She notes that he had been complaining of pain to the tip of his penis and was placed on doxycycline at that time.  Past Medical History:  Diagnosis Date  . Anxiety 01/30/2014  . Back injury    lower disc  . CAD (coronary artery disease)   . CLL (chronic lymphocytic leukemia) ([place]) 03/18/2011  . COPD (chronic obstructive pulmonary disease) ([place])   . Diabetes mellitus ([place])    Type 2   . ECRB (extensor carpi radialis brevis)  tenosynovitis   . GERD (gastroesophageal reflux disease)    takes Nexium if needed  . Hyperlipidemia   . Lateral epicondylitis of left elbow   . MI, acute, non ST segment elevation ([place]) 06/28/2009   with stenting of the LAD  . Neuromuscular disorder ([place])    peripheral neuropathy  . PVD (peripheral vascular disease) ([place])   . Tobacco abuse     Patient Active Problem List   Diagnosis Date Noted  . Community acquired bacterial pneumonia 11/28/2019  . Cerebral vascular disease 11/25/2019  . 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 Other fatigue 10/25/2019  . Deficiency anemia 10/25/2019  . Dizziness 10/22/2019  . Gait abnormality 10/22/2019  . Hx of nonmelanoma skin cancer 03/15/2019  . Essential hypertension 04/24/2018  . Influenza A 04/17/2018  . Sepsis ([place]) 04/17/2018  . Chronic diastolic CHF (congestive heart failure) ([place]) 04/17/2018  . Skin rash 08/07/2017  . Port-A-Cath in place 08/04/2017  . 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 Retained dental roots 04/26/2017  . Dental caries 04/26/2017  . 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 Loose, teeth 04/26/2017  . Peripheral polyneuropathy 04/14/2017  . COPD mixed type ([place]) 02/23/2017  . Chronic sinusitis 01/27/2017  . Splenomegaly, congestive, chronic 12/29/2016  . Diarrhea 12/01/2016  . Poor dentition 12/01/2016  . Benign mole 12/01/2016  . Pancytopenia, acquired ([place]) 11/03/2016  . Odynophagia 01/15/2016  . Polycythemia, secondary 06/26/2015  .  Quality of life palliative care encounter 06/26/2015  . Atherosclerosis of extremity with intermittent claudication ([place]) 01/23/2015  . Lateral epicondylitis of left elbow   . ECRB (extensor carpi radialis brevis) tenosynovitis   . Anxiety 01/30/2014  . Preoperative clearance 01/30/2014  . Thrombocytopenia ([place]) 01/30/2014  . Diabetes mellitus without complication ([place]) 51/76/1607  . Hypogammaglobulinemia, acquired ([place]) 12/26/2012  .  Hereditary and idiopathic peripheral neuropathy 08/20/2012  . 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 CAD (coronary artery disease) 07/29/2011  . 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 MI, acute, non ST segment elevation ([place])   . Hyperlipidemia   . PVD (peripheral vascular disease) ([place])   . GERD 09/27/2008  . SHOULDER STRAIN, RIGHT 09/27/2008    Past Surgical History:  Procedure Laterality Date  . ADENOIDECTOMY  1955  . CARDIAC CATHETERIZATION    . CARDIAC CATHETERIZATION N/A 09/26/2014   Procedure: Left Heart Cath and Coronary Angiography;  Surgeon: Peter M Martinique, MD;  Location: [facility];  Service: Cardiovascular;  Laterality: N/A;  . carpel tunnel release Left 04-1989  . carpel tunnel release  Right 01-1989  . CHOLECYSTECTOMY  2007  . CORONARY STENT PLACEMENT  May 2011  . femoral stents    . IR CV LINE INJECTION  08/18/2017  . IR CV LINE INJECTION  09/01/2017  . IR CV LINE INJECTION  02/02/2018  . LATERAL EPICONDYLE RELEASE Left 02/12/2014   Procedure: LEFT ELBOW DEBRIDEMENT WITH TENDON REPAIR ;  Surgeon: Lorn Junes, MD;  Location: [facility];  Service: Orthopedics;  Laterality: Left;  . LEFT CAI STENT/PTA AND POPLITEAL ARTERY/TIBIAL THROMBECTOMY     . LEFT HEART CATHETERIZATION WITH CORONARY ANGIOGRAM N/A 08/26/2011   Procedure: LEFT HEART CATHETERIZATION WITH CORONARY ANGIOGRAM;  Surgeon: Peter M Martinique, MD;  Location: [facility];  Service: Cardiovascular;  Laterality: N/A;  . PERIPHERAL VASCULAR CATHETERIZATION N/A 01/01/2015   Procedure: Abdominal Aortogram;  Surgeon: Conrad Iowa, MD;  Location: [facility];  Service: Cardiovascular;  Laterality: N/A;  . TARSAL TUNNEL RELEASE Bilateral 08-2007       Family History  Problem Relation Age of Onset  . Lung cancer Mother 70  . Cancer Mother        lung  . Heart failure Father 82  . Heart disease Father     Social History   Tobacco Use  . Smoking  status: Former Smoker    Packs/day: 1.00    Years: 44.00    Pack years: 44.00    Types: Cigarettes    Quit date: 04/23/2016    Years since quitting: 3.6  . Smokeless tobacco: Former Network engineer  . Vaping Use: Never used  Substance Use Topics  . Alcohol use: No    Alcohol/week: 0.0 standard drinks    Comment:  drinks non-alcoholic beer  . Drug use: No    Home Medications Prior to Admission medications   Medication Sig Start Date End Date Taking? Authorizing Provider  acyclovir (ZOVIRAX) 400 MG tablet TAKE 1 TABLET BY MOUTH TWICE A DAY Patient taking differently: Take 400 mg by mouth 2 (two) times daily.  07/24/19   [name], MD  albuterol (PROVENTIL HFA) 108 (90 Base) MCG/ACT inhaler TAKE 2 PUFFS BY MOUTH EVERY 6 HOURS AS NEEDED FOR WHEEZE OR SHORTNESS OF BREATH Patient taking differently: Inhale 2 puffs into the lungs every 6 (six) hours as needed for wheezing or shortness of breath. TAKE  2 PUFFS BY MOUTH EVERY 6 HOURS AS NEEDED FOR WHEEZE OR SHORTNESS OF BREATH 06/27/19   Spero Geralds, MD  allopurinol (ZYLOPRIM) 300 MG tablet TAKE 1 TABLET BY MOUTH EVERY DAY Patient taking differently: Take 300 mg by mouth daily.  09/23/19   [name], MD  ALPRAZolam Duanne Moron) 0.25 MG tablet Take 0.5 mg by mouth as needed for anxiety.     [provider]  amoxicillin-clavulanate (AUGMENTIN) 875-125 MG tablet Take 1 tablet by mouth every 12 (twelve) hours. 12/05/19   Charlynne Cousins, MD  b complex vitamins tablet Take 1 tablet by mouth daily.     [provider]  benzonatate (TESSALON) 100 MG capsule Take 2 capsules (200 mg total) by mouth 3 (three) times daily as needed for cough. 11/14/19   Spero Geralds, MD  CALQUENCE 100 MG capsule TAKE 100 MG BY MOUTH 2 (TWO) TIMES DAILY. Patient taking differently: Take 100 mg by mouth 2 (two) times daily.  04/11/19   [name], MD  Cholecalciferol (VITAMIN D-1000 MAX ST) 1000 units tablet Take 1,000 Units by mouth daily.      [provider]  famotidine (PEPCID) 20 MG tablet TAKE 1 TABLET BY MOUTH TWICE A DAY Patient taking differently: Take 20 mg by mouth 2 (two) times daily as needed for heartburn.  02/11/19   [name], MD  fluticasone furoate-vilanterol (BREO ELLIPTA) 200-25 MCG/INH AEPB Inhale 1 puff into the lungs daily. 06/27/19   Spero Geralds, MD  furosemide (LASIX) 20 MG tablet TAKE 1 TABLET BY MOUTH EVERY DAY AS NEEDED Patient taking differently: Take 20 mg by mouth daily as needed for fluid or edema.  04/23/19   Burtis Junes, NP  HYDROcodone-acetaminophen (NORCO) 10-325 MG tablet Take 1 tablet by mouth See admin instructions. Every 6 to 8 hours as needed for pain 04/14/15   [provider]  HYDROcodone-homatropine (HYCODAN) 5-1.5 MG/5ML syrup Take 5 mLs by mouth every 6 (six) hours as needed for cough. 11/21/19   [provider]  lidocaine-prilocaine (EMLA) cream APPLY TO AFFECTED AREA ONCE Patient taking differently: Apply 1 application topically once.  12/21/17   Nicholas Lose, MD  loperamide (IMODIUM) 2 MG capsule Take 2 capsules (4 mg total) by mouth every 6 (six) hours as needed for diarrhea or loose stools. 10/25/19   [name], MD  meclizine (ANTIVERT) 12.5 MG tablet Take 12.5 mg by mouth every 6 (six) hours as needed for dizziness.  12/24/14   [provider]  metFORMIN (GLUCOPHAGE-XR) 500 MG 24 hr tablet Take 500-1,000 mg by mouth See admin instructions. Takes one tablet (500mg )  in AM and 2 tablets (1000mg ) at night.    [provider]  mometasone (NASONEX) 50 MCG/ACT nasal spray Place 2 sprays into the nose daily. 11/08/19   Spero Geralds, MD  montelukast (SINGULAIR) 10 MG tablet TAKE 1 TABLET BY MOUTH EVERY DAY 12/11/19   Spero Geralds, MD  nitroGLYCERIN (NITROSTAT) 0.4 MG SL tablet PLACE 1 TABLET UNDER THE TONGUE EVERY 5 MINUTES X 3 DOSES AS NEEDED FOR CHEST PAIN *MAX 3 DOSES* Patient taking differently: Place 0.4 mg under the tongue See admin  instructions. PLACE 1 TABLET UNDER THE TONGUE EVERY 5 MINUTES X 3 DOSES AS NEEDED FOR CHEST PAIN *MAX 3 DOSES* 10/03/19   Burtis Junes, NP  predniSONE (DELTASONE) 20 MG tablet Take 20 mg by mouth See admin instructions. Take 1 tablet (20mg ) by mouth three times daily for 3 days, then  take 1 tablet (20mg ) by mouth twice daily for 3 days, then take 1 tablet by mouth daily for 3 days. 11/20/19   [provider]  pregabalin (LYRICA) 200 MG capsule TAKE 1 CAPSULE (200 MG TOTAL) BY MOUTH 3 (THREE) TIMES DAILY. 09/02/19   Suzzanne Cloud, NP  rosuvastatin (CRESTOR) 5 MG tablet TAKE 1 TABLET BY MOUTH EVERY DAY Patient taking differently: Take 5 mg by mouth daily.  07/24/19   Burtis Junes, NP  tamsulosin (FLOMAX) 0.4 MG CAPS capsule Take 1 capsule (0.4 mg total) by mouth daily. 12/06/19   Charlynne Cousins, MD  zolpidem (AMBIEN) 10 MG tablet Take 10 mg by mouth at bedtime as needed for sleep.    [provider]    Allergies    Privigen [immune globulin] and Codeine  Review of Systems   Review of Systems  Constitutional: Positive for fever. Negative for chills.  HENT: Negative for ear pain and sore throat.   Eyes: Negative for visual disturbance.  Respiratory: Positive for cough. Negative for shortness of breath.   Cardiovascular: Negative for chest pain.  Gastrointestinal: Negative for abdominal pain, constipation, diarrhea, nausea and vomiting.  Genitourinary: Positive for hematuria. Negative for dysuria.  Musculoskeletal: Negative for back pain.  Skin: Negative for rash.  Neurological: Tremors: generalized.       Confused  All other systems reviewed and are negative.   Physical Exam Updated Vital Signs BP (!) 72/47   Pulse 99   Temp 100.1 F (37.8 C) (Oral)   Resp 14   Ht 6' (1.829 m)   Wt 75 kg   SpO2 98%   BMI 22.42 kg/m   Physical Exam Vitals and nursing note reviewed.  Constitutional:      Appearance: He is well-developed.  HENT:     Head:  Normocephalic and atraumatic.  Eyes:     Extraocular Movements: Extraocular movements intact.     Conjunctiva/sclera: Conjunctivae normal.     Pupils: Pupils are equal, round, and reactive to light.  Cardiovascular:     Rate and Rhythm: Regular rhythm. Tachycardia present.     Heart sounds: No murmur heard.   Pulmonary:     Breath sounds: Rales present. No rhonchi.     Comments: tachypneic Abdominal:     Palpations: Abdomen is soft.     Tenderness: There is no abdominal tenderness.  Musculoskeletal:     Cervical back: Neck supple.     Right lower leg: Edema present.     Left lower leg: Edema present.  Skin:    General: Skin is warm and dry.  Neurological:     Mental Status: He is alert.     Comments: Mental Status:  Sleepy but arousable to voice, oriented to self, place, situation, and day but not year. Slow to respond but responds appropriately. Able to follow simple commands. Clear speech, no facial droop. No aphasia. Motor:  Normal tone. Globally weak but symmetric strength of BUE and BLE major muscle groups including strong and equal grip strength and dorsiflexion/plantar flexion Sensory: light touch normal in all extremities. Gait: not assessed      ED Results / Procedures / Treatments   Labs (all labs ordered are listed, but only abnormal results are displayed) Labs Reviewed  COMPREHENSIVE METABOLIC PANEL - Abnormal; Notable for the following components:      Result Value   Calcium 8.3 (*)    Total Protein 4.8 (*)    Albumin 2.6 (*)    All  other components within normal limits  CBC WITH DIFFERENTIAL/PLATELET - Abnormal; Notable for the following components:   RBC 3.62 (*)    Hemoglobin 11.9 (*)    HCT 38.6 (*)    MCV 106.6 (*)    RDW 16.8 (*)    Platelets 62 (*)    Abs Immature Granulocytes 0.35 (*)    All other components within normal limits  URINALYSIS, ROUTINE W REFLEX MICROSCOPIC - Abnormal; Notable for the following components:   Hgb urine dipstick  MODERATE (*)    Leukocytes,Ua MODERATE (*)    Bacteria, UA RARE (*)    All other components within normal limits  BRAIN NATRIURETIC PEPTIDE - Abnormal; Notable for the following components:   B Natriuretic Peptide 203.2 (*)    All other components within normal limits  BLOOD GAS, VENOUS - Abnormal; Notable for the following components:   pCO2, Ven 43.9 (*)    pO2, Ven 64.6 (*)    All other components within normal limits  I-STAT ARTERIAL BLOOD GAS, ED - Abnormal; Notable for the following components:   pH, Arterial 7.541 (*)    pCO2 arterial 29.8 (*)    pO2, Arterial 78 (*)    Acid-Base Excess 3.0 (*)    Calcium, Ion 1.11 (*)    HCT 24.0 (*)    Hemoglobin 8.2 (*)    All other components within normal limits  RESP PANEL BY RT PCR (RSV, FLU A&B, COVID)  CULTURE, BLOOD (ROUTINE X 2)  CULTURE, BLOOD (ROUTINE X 2)  URINE CULTURE  LACTIC ACID, PLASMA  PROTIME-INR  APTT  BLOOD GAS, ARTERIAL  PROCALCITONIN  CBG MONITORING, ED    EKG EKG Interpretation  Date/Time:  Sunday December 15 2019 06:28:34 EDT Ventricular Rate:  118 PR Interval:    QRS Duration: 82 QT Interval:  294 QTC Calculation: 412 R Axis:   110 Text Interpretation: Sinus tachycardia Right axis deviation Borderline low voltage, extremity leads Confirmed by Orpah Greek ([id number]) on 12/15/2019 6:36:43 AM   Radiology DG Chest Port 1 View  Result Date: 12/15/2019 CLINICAL DATA:  Possible sepsis. EXAM: PORTABLE CHEST 1 VIEW COMPARISON:  11/28/2019 and earlier studies. FINDINGS: Patchy airspace consolidation is noted in the left mid and both lower lungs. The consolidation on the left is less dense than it was on the most recent prior exam consistent with mild improvement. No change on the right. No new lung abnormalities. No convincing pleural effusion.  No pneumothorax. Stable right anterior chest wall Port-A-Cath, tip in the lower superior vena cava. IMPRESSION: 1. Mild interval improvement with decreased  density of the consolidation in the left mid to lower lung. 2. No other change. Electronically Signed   By: Lajean Manes M.D.   On: 12/15/2019 07:10    Procedures Procedures (including critical care time)  CRITICAL CARE Performed by: Rodney Booze   Total critical care time: 33 minutes  Critical care time was exclusive of separately billable procedures and treating other patients.  Critical care was necessary to treat or prevent imminent or life-threatening deterioration.  Critical care was time spent personally by me on the following activities: development of treatment plan with patient and/or surrogate as well as nursing, discussions with consultants, evaluation of patient's response to treatment, examination of patient, obtaining history from patient or surrogate, ordering and performing treatments and interventions, ordering and review of laboratory studies, ordering and review of radiographic studies, pulse oximetry and re-evaluation of patient's condition.  Medications Ordered in ED Medications  albuterol (  VENTOLIN HFA) 108 (90 Base) MCG/ACT inhaler 4 puff (4 puffs Inhalation Refused 12/15/19 0825)  vancomycin (VANCOREADY) IVPB 1500 mg/300 mL (1,500 mg Intravenous New Bag/Given 12/15/19 0824)  ceFEPIme (MAXIPIME) 2 g in sodium chloride 0.9 % 100 mL IVPB (0 g Intravenous Stopped 12/15/19 0817)  metroNIDAZOLE (FLAGYL) IVPB 500 mg (0 mg Intravenous Stopped 12/15/19 0850)  acetaminophen (TYLENOL) tablet 1,000 mg (1,000 mg Oral Given 12/15/19 0730)  lactated ringers bolus 500 mL (0 mLs Intravenous Stopped 12/15/19 0850)  lactated ringers bolus 1,000 mL (1,000 mLs Intravenous New Bag/Given 12/15/19 6812)    ED Course  I have reviewed the triage vital signs and the nursing notes.  Pertinent labs & imaging results that were available during my care of the patient were reviewed by me and considered in my medical decision making (see chart for details).    MDM  Rules/Calculators/A&P                          69 year old male who presents to the emergency department today for evaluation of fever, generalized weakness and altered mental status.  On arrival patient tachycardic, febrile, hypoxic and hypotensive.Reviewed patients chart. His Bps are typically around 751 systolic or less so feel that his current BP is actually at his baseline. He does also appear to have some evidence of fluid overload and exam and therefore he was gently hydrated with an initial 500 cc bolus. He had 1L IVF en route with EMS.  Code sepsis was initiated, broad-spectrum antibiotics were ordered as well as fluids mentioned above.  Reviewed/interpreted labs CBC is without leukocytosis, anemia present and improving from prior, thrombocytopenia appears to be at baseline CMP with no significant electrolyte derangement, normal kidney and liver function Coags are negative Lactic acid is negative UA with hematuria, moderate leukocytes, 21-50 RBCs, 11-20 WBCs and rare bacteria. Urine culture obtained.   EKG - Sinus tachycardia Right axis deviation Borderline low voltage, extremity leads  CXR reviewed/interpreted -  1. Mild interval improvement with decreased density of the consolidation in the left mid to lower lung. 2. No other change.   BP noted to be lower than baseline, full 30 cc/kg bolus given.   With with SIRS criteria. UTI is likely source. Abx given. Will admit.   9:14 PM CONSULT with Dr. Marcello Moores who accepts patient for admission. Pt remains hypotensive on her eval. Recommends crit care consult.   10:19 AM CONSULT with critical care who will admit the patient   Final Clinical Impression(s) / ED Diagnoses Final diagnoses:  SIRS (systemic inflammatory response syndrome) ([place])  Acute cystitis with hematuria  Altered mental status, unspecified altered mental status type    Rx / DC Orders ED Discharge Orders    None       Rodney Booze, PA-C 12/15/19 0940     Rodney Booze, PA-C 12/15/19 7001    Rodney Booze, PA-C 12/15/19 1020    Jenette Rayson S, PA-C 12/15/19 1020    Orpah Greek, MD 12/18/19 2345

## 2019-12-15 NOTE — ED Notes (Signed)
Patient transported to CT 

## 2019-12-16 DIAGNOSIS — N390 Urinary tract infection, site not specified: Secondary | ICD-10-CM

## 2019-12-16 DIAGNOSIS — A419 Sepsis, unspecified organism: Secondary | ICD-10-CM | POA: Diagnosis not present

## 2019-12-16 DIAGNOSIS — R6521 Severe sepsis with septic shock: Secondary | ICD-10-CM | POA: Diagnosis not present

## 2019-12-16 LAB — GLUCOSE, CAPILLARY: Glucose-Capillary: 149 mg/dL — ABNORMAL HIGH (ref 70–99)

## 2019-12-16 LAB — BASIC METABOLIC PANEL
Anion gap: 7 (ref 5–15)
BUN: 17 mg/dL (ref 8–23)
CO2: 24 mmol/L (ref 22–32)
Calcium: 8.1 mg/dL — ABNORMAL LOW (ref 8.9–10.3)
Chloride: 107 mmol/L (ref 98–111)
Creatinine, Ser: 0.85 mg/dL (ref 0.61–1.24)
GFR, Estimated: >60 mL/min (ref >60)
Glucose, Bld: 211 mg/dL — ABNORMAL HIGH (ref 70–99)
Potassium: 4.4 mmol/L (ref 3.5–5.1)
Sodium: 138 mmol/L (ref 135–145)

## 2019-12-16 LAB — CBC
HCT: 33.7 % — ABNORMAL LOW (ref 39.0–52.0)
Hemoglobin: 10.7 g/dL — ABNORMAL LOW (ref 13.0–17.0)
MCH: 32.5 pg (ref 26.0–34.0)
MCHC: 31.8 g/dL (ref 30.0–36.0)
MCV: 102.4 fL — ABNORMAL HIGH (ref 80.0–100.0)
Platelets: 81 10*3/uL — ABNORMAL LOW (ref 150–400)
RBC: 3.29 MIL/uL — ABNORMAL LOW (ref 4.22–5.81)
RDW: 16.7 % — ABNORMAL HIGH (ref 11.5–15.5)
WBC: 11.3 10*3/uL — ABNORMAL HIGH (ref 4.0–10.5)
nRBC: 0 % (ref 0.0–0.2)

## 2019-12-16 LAB — MAGNESIUM: Magnesium: 1.9 mg/dL (ref 1.7–2.4)

## 2019-12-16 LAB — PROCALCITONIN: Procalcitonin: 4.82 ng/mL

## 2019-12-16 MED ORDER — ACETAMINOPHEN 325 MG PO TABS
650.0000 mg | ORAL_TABLET | Freq: Four times a day (QID) | ORAL | Status: DC | PRN
  Administered 2019-12-16 – 2019-12-18 (×2): 650 mg via ORAL
  Filled 2019-12-16 (×2): qty 2

## 2019-12-16 MED ORDER — MIDODRINE HCL 5 MG PO TABS
5.0000 mg | ORAL_TABLET | Freq: Three times a day (TID) | ORAL | Status: DC
  Administered 2019-12-16 – 2019-12-20 (×12): 5 mg via ORAL
  Filled 2019-12-16 (×12): qty 1

## 2019-12-16 MED ORDER — ALPRAZOLAM 0.5 MG PO TABS
0.5000 mg | ORAL_TABLET | Freq: Two times a day (BID) | ORAL | Status: DC | PRN
  Administered 2019-12-16 – 2019-12-18 (×3): 0.5 mg via ORAL
  Filled 2019-12-16 (×3): qty 1

## 2019-12-16 NOTE — Progress Notes (Signed)
NAME:  Ryan Copeland, MRN:  [id number], DOB:  August 19, 1950, LOS: 1 ADMISSION DATE:  12/15/2019, CONSULTATION DATE:  10/31 REFERRING MD:  Tivis Ringer, CHIEF COMPLAINT:  Sepsis   Brief History   69 year old male with history of CLL being maintained on acalabrutinib, just discharged from the hospital 10/21 after being treated for pneumonia and sepsis, course was complicated by urinary retention for which he was discharged with Foley.  He was seen by urology on 10/28 for voiding trials which were unsuccessful.  Foley catheter was replaced.  Presents 10/31 with SIRS/sepsis/septic shock presumptively urinary tract source critical care asked to admit.  History of present illness   This is a 69 year old male patient with history as mentioned below.  Just discharged from the hospital on 10/21 after being admitted for sepsis, pneumonia, thrombocytopenia, and encephalopathy.  Hospital course complicated by urinary retention for which he was sent home with urinary catheter.  In regards to his Malignancy he is typically followed by Dr. Alvy Bimler, it appears as though his last visit was on 9/10 with notes of being observation on acalabrutinib.  He was discharged home, had noted follow-up at urology where he underwent unsuccessful voiding trials on 10/28 and therefore his Foley catheter was replaced with notable hematuria following this visit.  He presents to the emergency room on 10/31 after wife called EMS reporting confusion and altered sensorium.  On arrival to the emergency room his systolic blood pressure was in the 80s he was given 1 L of crystalloid, cultures were sent, and he was evaluated possible admission.  Further questioning, the wife notes low-grade fever for started on the evening of 10/30, fevers have been higher on 10/31 with patient more confused and exhibiting generalized weakness.  Apparently was placed on doxycycline for discomfort at the tip of his penis during the visit at urology.  While in the emergency  room empiric antibiotics initiated.  He had been receiving 30 mL/kg predicted body weight IV fluids and in spite of this had persistent hypotension with systolic blood pressure in the 70s and ongoing confusion and because of this critical care was asked to admit.  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  Recovered from COVID-19 infection September 2021. -> Most recently discharged from hospital on 10/21 after being admitted for working diagnosis of sepsis in the setting of community-acquired pneumonia complicated by metabolic encephalopathy which resolved, thrombocytopenia, leukopenia, and acute urinary retention for which she was sent home with urinary catheter  Significant Hospital Events   10/31 admitted w/ w-dx urosepsis  Consults:    Procedures:    Significant Diagnostic Tests:    Micro Data:  Urine 10/31>>> Blood 10/31>>>  Antimicrobials:  Cefepime 10/31>>>  Interim history/subjective:  Now on 10mcg of norepinephrine. Urine cx growing partially resistant pseudomonas.  Foley catheter was last changed in clinic on Thursday.  Objective   Blood pressure 100/62, pulse 64, temperature 97.7 F (36.5 C), temperature source Oral, resp. rate 19, height 6' (1.829 m), weight 80.1 kg, SpO2 95 %.        Intake/Output Summary (Last 24 hours) at 12/16/2019 0815 Last data filed at 12/16/2019 0700 Gross per 24 hour  Intake 4736.38 ml  Output 2675 ml  Net 2061.38 ml   Filed Weights   12/15/19 0722 12/15/19 0723 12/16/19 0430  Weight: 78.7 kg 75 kg 80.1 kg    Examination: General: 69 year old chronically ill male no  distress Lungs: clear to auscultation no wheezes or crackles Cardiovascular: RRR no MRG Abdomen: soft  Extremities: warm and dry. Some pitting edema LEs brisk CR good pulses. Scattered areas of ecchymosis  Neuro: awake and oriented x4 GU: cnc yellow   Resolved Hospital Problem  list    Acute metabolic encephalopathy Assessment & Plan:  Severe sepsis septic shock; presumptively UT source in setting of indwelling Foley catheter (chronic for Urinary retention) Plan Urine culture demonstrating partially resistant Pseudomonas, will continue IV cefepime.  He will need 10 days of antibiotics.  Foley catheter was changed on Thursday in urology clinic. We will wean pressors, on low-dose nor epi now. Hold antihypertensives Will stop stress dose steroids, was only ever in single pressor shock  Anemia of chronic disease Plan Trend cbc Transfuse for hgb < 7  Thrombocytopenia->actually improved from nadar last hospital admit Plan scds  H/o CLL Plan Hold acalabrutinib Will need to notify onc this week of admit  Urinary retention -foley just replaced 10/28 Plan Keep foley  H/o COPD, recent PNA  pcxr personally reviewed w/ improved aeration on left lung airspace disease  Plan Cont Home BDs  DM Plan Holding metformin ssi starting as being placed on stress dose steroids Best practice:  Diet: Regular diet Pain/Anxiety/Delirium protocol (if indicated): NA VAP protocol (if indicated): NA DVT prophylaxis: scd GI prophylaxis: NA Glucose control: ssi Mobility: BR Code Status: full code  Family Communication: Patient is a clinic patient of mine for COPD.  I updated him at bedside today. Disposition: to ICU   The patient is critically ill with multiple organ systems failure and requires high complexity decision making for assessment and support, frequent evaluation and titration of therapies, application of advanced monitoring technologies and extensive interpretation of multiple databases.   Critical Care Time devoted to patient care services described in this note is 34 minutes. This time reflects time of care of this [place] . This critical care time does not reflect separately billable procedures or procedure time, teaching time or supervisory  time of PA/NP/Med student/Med Resident etc but could involve care discussion time.  [facility] 12/16/2019 10:14 AM  Pager: [phone] After hours pager: [phone]   Labs   CBC: Recent Labs  Lab 12/15/19 0647 12/15/19 1005 12/16/19 0400  WBC 5.5  --  11.3*  NEUTROABS 3.4  --   --   HGB 11.9* 8.2* 10.7*  HCT 38.6* 24.0* 33.7*  MCV 106.6*  --  102.4*  PLT 62*  --  81*    Basic Metabolic Panel: Recent Labs  Lab 12/15/19 0647 12/15/19 1005 12/16/19 0400  NA 139 136 138  K 5.1 4.3 4.4  CL 105  --  107  CO2 24  --  24  GLUCOSE 95  --  211*  BUN 11  --  17  CREATININE 0.88  --  0.85  CALCIUM 8.3*  --  8.1*  MG  --   --  1.9   GFR: Estimated Creatinine Clearance: 91.3 mL/min (by C-G formula based on SCr of 0.85 mg/dL). Recent Labs  Lab 12/15/19 0647 12/15/19 1022 12/15/19 1153 12/15/19 1446 12/15/19 2048 12/16/19 0400  PROCALCITON  --  1.66  --   --   --  4.82  WBC 5.5  --   --   --   --  11.3*  LATICACIDVEN 1.2  --  0.8 6.0* 1.9  --     Liver Function Tests: Recent  Labs  Lab 12/15/19 0647  AST 19  ALT 11  ALKPHOS 97  BILITOT 0.9  PROT 4.8*  ALBUMIN 2.6*   No results for input(s): LIPASE, AMYLASE in the last 168 hours. No results for input(s): AMMONIA in the last 168 hours.  ABG    Component Value Date/Time   PHART 7.541 (H) 12/15/2019 1005   PCO2ART 29.8 (L) 12/15/2019 1005   PO2ART 78 (L) 12/15/2019 1005   HCO3 25.6 12/15/2019 1005   TCO2 26 12/15/2019 1005   O2SAT 97.0 12/15/2019 1005     Coagulation Profile: Recent Labs  Lab 12/15/19 0647  INR 1.2    Cardiac Enzymes: No results for input(s): CKTOTAL, CKMB, CKMBINDEX, TROPONINI in the last 168 hours.  HbA1C: Hgb A1c MFr Bld  Date/Time Value Ref Range Status  11/28/2019 07:22 PM 5.1 4.8 - 5.6 % Final    Comment:    (NOTE)         Prediabetes: 5.7 - 6.4         Diabetes: >6.4         Glycemic control for adults with diabetes:  <7.0   04/13/2017 08:23 AM 6.2 (H) 4.8 - 5.6 % Final    Comment:             Prediabetes: 5.7 - 6.4          Diabetes: >6.4          Glycemic control for adults with diabetes: <7.0     CBG: Recent Labs  Lab 12/15/19 0642 12/15/19 1153 12/15/19 1339 12/15/19 2157 12/16/19 0733  GLUCAP 88 83 96 264* 149*      Past Medical History  He,  has a past medical history of Anxiety (01/30/2014), Back injury, CAD (coronary artery disease), CLL (chronic lymphocytic leukemia) ([place]) (03/18/2011), COPD (chronic obstructive pulmonary disease) ([place]), Diabetes mellitus ([place]), ECRB (extensor carpi radialis brevis) tenosynovitis, GERD (gastroesophageal reflux disease), Hyperlipidemia, Lateral epicondylitis of left elbow, MI, acute, non ST segment elevation ([place]) (06/28/2009), Neuromuscular disorder ([place]), PVD (peripheral vascular disease) ([place]), and Tobacco abuse.   Surgical History    Past Surgical History:  Procedure Laterality Date   [place] N/A 09/26/2014   Procedure: Left Heart Cath and Coronary Angiography;  Surgeon: Peter M Martinique, MD;  Location: [facility];  Service: Cardiovascular;  Laterality: N/A;   carpel tunnel release Left 04-1989   carpel tunnel release  Right 01-1989   CHOLECYSTECTOMY  2007   CORONARY STENT PLACEMENT  May 2011   femoral stents     IR CV LINE INJECTION  08/18/2017   IR CV LINE INJECTION  09/01/2017   IR CV LINE INJECTION  02/02/2018   LATERAL EPICONDYLE RELEASE Left 02/12/2014   Procedure: LEFT ELBOW DEBRIDEMENT WITH TENDON REPAIR ;  Surgeon: Lorn Junes, MD;  Location: [facility];  Service: Orthopedics;  Laterality: Left;   LEFT CAI STENT/PTA AND POPLITEAL ARTERY/TIBIAL THROMBECTOMY      LEFT HEART CATHETERIZATION WITH CORONARY ANGIOGRAM N/A 08/26/2011   Procedure: LEFT HEART CATHETERIZATION WITH CORONARY ANGIOGRAM;  Surgeon: Peter M Martinique, MD;  Location: [facility];  Service:  Cardiovascular;  Laterality: N/A;   PERIPHERAL VASCULAR CATHETERIZATION N/A 01/01/2015   Procedure: Abdominal Aortogram;  Surgeon: Conrad Long Hill, MD;  Location: [place];  Service: Cardiovascular;  Laterality: N/A;   TARSAL TUNNEL RELEASE Bilateral 08-2007     Social History   reports that he  quit smoking about 3 years ago. His smoking use included cigarettes. He has a 44.00 pack-year smoking history. He has quit using smokeless tobacco. He reports that he does not drink alcohol and does not use drugs.   Family History   His family history includes Cancer in his mother; Heart disease in his father; Heart failure (age of onset: 59) in his father; Lung cancer (age of onset: 77) in his mother.   Allergies Allergies  Allergen Reactions   Privigen [Immune Globulin] Hives    Pt reports hives/rash on body with IV Privigen. He was changed to IV Gamunex-C and had many infusions without adverse side effects.   Codeine Hives    Pt states he can take a few, more reaction with extended doses.     Home Medications  Prior to Admission medications   Medication Sig Start Date End Date Taking? Authorizing Provider  acyclovir (ZOVIRAX) 400 MG tablet TAKE 1 TABLET BY MOUTH TWICE A DAY Patient taking differently: Take 400 mg by mouth 2 (two) times daily.  07/24/19   [name], MD  albuterol (PROVENTIL HFA) 108 (90 Base) MCG/ACT inhaler TAKE 2 PUFFS BY MOUTH EVERY 6 HOURS AS NEEDED FOR WHEEZE OR SHORTNESS OF BREATH Patient taking differently: Inhale 2 puffs into the lungs every 6 (six) hours as needed for wheezing or shortness of breath. TAKE 2 PUFFS BY MOUTH EVERY 6 HOURS AS NEEDED FOR WHEEZE OR SHORTNESS OF BREATH 06/27/19   Spero Geralds, MD  allopurinol (ZYLOPRIM) 300 MG tablet TAKE 1 TABLET BY MOUTH EVERY DAY Patient taking differently: Take 300 mg by mouth daily.  09/23/19   [name], MD  ALPRAZolam Duanne Moron) 0.25 MG tablet Take 0.5 mg by mouth as needed for anxiety.     [provider]  amoxicillin-clavulanate (AUGMENTIN) 875-125 MG tablet Take 1 tablet by mouth every 12 (twelve) hours. 12/05/19   Charlynne Cousins, MD  b complex vitamins tablet Take 1 tablet by mouth daily.     [provider]  benzonatate (TESSALON) 100 MG capsule Take 2 capsules (200 mg total) by mouth 3 (three) times daily as needed for cough. 11/14/19   Spero Geralds, MD  CALQUENCE 100 MG capsule TAKE 100 MG BY MOUTH 2 (TWO) TIMES DAILY. Patient taking differently: Take 100 mg by mouth 2 (two) times daily.  04/11/19   [name], MD  Cholecalciferol (VITAMIN D-1000 MAX ST) 1000 units tablet Take 1,000 Units by mouth daily.     [provider]  famotidine (PEPCID) 20 MG tablet TAKE 1 TABLET BY MOUTH TWICE A DAY Patient taking differently: Take 20 mg by mouth 2 (two) times daily as needed for heartburn.  02/11/19   [name], MD  fluticasone furoate-vilanterol (BREO ELLIPTA) 200-25 MCG/INH AEPB Inhale 1 puff into the lungs daily. 06/27/19   Spero Geralds, MD  furosemide (LASIX) 20 MG tablet TAKE 1 TABLET BY MOUTH EVERY DAY AS NEEDED Patient taking differently: Take 20 mg by mouth daily as needed for fluid or edema.  04/23/19   Burtis Junes, NP  HYDROcodone-acetaminophen (NORCO) 10-325 MG tablet Take 1 tablet by mouth See admin instructions. Every 6 to 8 hours as needed for pain 04/14/15   [provider]  HYDROcodone-homatropine (HYCODAN) 5-1.5 MG/5ML syrup Take 5 mLs by mouth every 6 (six) hours as needed for cough. 11/21/19   [provider]  lidocaine-prilocaine (EMLA) cream APPLY TO AFFECTED AREA ONCE Patient taking differently: Apply 1 application topically once.  12/21/17   Nicholas Lose, MD  loperamide (IMODIUM) 2 MG capsule Take 2 capsules (4 mg total) by mouth every 6 (six) hours as needed for diarrhea or loose stools. 10/25/19   [name], MD  meclizine (ANTIVERT) 12.5 MG tablet Take 12.5 mg by mouth every 6 (six) hours as needed for dizziness.   12/24/14   [provider]  metFORMIN (GLUCOPHAGE-XR) 500 MG 24 hr tablet Take 500-1,000 mg by mouth See admin instructions. Takes one tablet (500mg )  in AM and 2 tablets (1000mg ) at night.    [provider]  mometasone (NASONEX) 50 MCG/ACT nasal spray Place 2 sprays into the nose daily. 11/08/19   Spero Geralds, MD  montelukast (SINGULAIR) 10 MG tablet TAKE 1 TABLET BY MOUTH EVERY DAY 12/11/19   Spero Geralds, MD  nitroGLYCERIN (NITROSTAT) 0.4 MG SL tablet PLACE 1 TABLET UNDER THE TONGUE EVERY 5 MINUTES X 3 DOSES AS NEEDED FOR CHEST PAIN *MAX 3 DOSES* Patient taking differently: Place 0.4 mg under the tongue See admin instructions. PLACE 1 TABLET UNDER THE TONGUE EVERY 5 MINUTES X 3 DOSES AS NEEDED FOR CHEST PAIN *MAX 3 DOSES* 10/03/19   Burtis Junes, NP  predniSONE (DELTASONE) 20 MG tablet Take 20 mg by mouth See admin instructions. Take 1 tablet (20mg ) by mouth three times daily for 3 days, then take 1 tablet (20mg ) by mouth twice daily for 3 days, then take 1 tablet by mouth daily for 3 days. 11/20/19   [provider]  pregabalin (LYRICA) 200 MG capsule TAKE 1 CAPSULE (200 MG TOTAL) BY MOUTH 3 (THREE) TIMES DAILY. 09/02/19   Suzzanne Cloud, NP  rosuvastatin (CRESTOR) 5 MG tablet TAKE 1 TABLET BY MOUTH EVERY DAY Patient taking differently: Take 5 mg by mouth daily.  07/24/19   Burtis Junes, NP  tamsulosin (FLOMAX) 0.4 MG CAPS capsule Take 1 capsule (0.4 mg total) by mouth daily. 12/06/19   Charlynne Cousins, MD  zolpidem (AMBIEN) 10 MG tablet Take 10 mg by mouth at bedtime as needed for sleep.    [provider]

## 2019-12-17 ENCOUNTER — Inpatient Hospital Stay (HOSPITAL_COMMUNITY): Payer: Medicare Other

## 2019-12-17 DIAGNOSIS — F419 Anxiety disorder, unspecified: Secondary | ICD-10-CM

## 2019-12-17 DIAGNOSIS — I517 Cardiomegaly: Secondary | ICD-10-CM | POA: Diagnosis not present

## 2019-12-17 DIAGNOSIS — C9110 Chronic lymphocytic leukemia of B-cell type not having achieved remission: Secondary | ICD-10-CM

## 2019-12-17 DIAGNOSIS — R338 Other retention of urine: Secondary | ICD-10-CM

## 2019-12-17 DIAGNOSIS — A419 Sepsis, unspecified organism: Secondary | ICD-10-CM | POA: Diagnosis not present

## 2019-12-17 LAB — CBC
HCT: 26.5 % — ABNORMAL LOW (ref 39.0–52.0)
Hemoglobin: 8.5 g/dL — ABNORMAL LOW (ref 13.0–17.0)
MCH: 33.3 pg (ref 26.0–34.0)
MCHC: 32.1 g/dL (ref 30.0–36.0)
MCV: 103.9 fL — ABNORMAL HIGH (ref 80.0–100.0)
Platelets: 46 10*3/uL — ABNORMAL LOW (ref 150–400)
RBC: 2.55 MIL/uL — ABNORMAL LOW (ref 4.22–5.81)
RDW: 16.7 % — ABNORMAL HIGH (ref 11.5–15.5)
WBC: 2 10*3/uL — ABNORMAL LOW (ref 4.0–10.5)
nRBC: 0 % (ref 0.0–0.2)

## 2019-12-17 LAB — BASIC METABOLIC PANEL
Anion gap: 4 — ABNORMAL LOW (ref 5–15)
BUN: 12 mg/dL (ref 8–23)
CO2: 24 mmol/L (ref 22–32)
Calcium: 6.9 mg/dL — ABNORMAL LOW (ref 8.9–10.3)
Chloride: 114 mmol/L — ABNORMAL HIGH (ref 98–111)
Creatinine, Ser: 0.53 mg/dL — ABNORMAL LOW (ref 0.61–1.24)
GFR, Estimated: >60 mL/min (ref >60)
Glucose, Bld: 83 mg/dL (ref 70–99)
Potassium: 2.8 mmol/L — ABNORMAL LOW (ref 3.5–5.1)
Sodium: 142 mmol/L (ref 135–145)

## 2019-12-17 LAB — ECHOCARDIOGRAM COMPLETE
Area-P 1/2: 4.21 cm2
Height: 72 in
S' Lateral: 3.1 cm
Weight: 2800.72 oz

## 2019-12-17 LAB — URINE CULTURE: Culture: 40000 — AB

## 2019-12-17 LAB — PROCALCITONIN: Procalcitonin: 4.01 ng/mL

## 2019-12-17 MED ORDER — NOREPINEPHRINE 4 MG/250ML-% IV SOLN
0.0000 ug/min | INTRAVENOUS | Status: DC
  Administered 2019-12-17: 2 ug/min via INTRAVENOUS
  Filled 2019-12-17: qty 250

## 2019-12-17 NOTE — Progress Notes (Signed)
  Echocardiogram 2D Echocardiogram has been performed.  Fidel Levy 12/17/2019, 1:50 PM

## 2019-12-17 NOTE — Progress Notes (Addendum)
NAME:  Ryan Copeland, MRN:  [id number], DOB:  10/30/1950, LOS: 2 ADMISSION DATE:  12/15/2019, CONSULTATION DATE:  10/31 REFERRING MD:  Tivis Ringer, CHIEF COMPLAINT:  Sepsis   Brief History   69 year old male with history of CLL being maintained on acalabrutinib, just discharged from the hospital 10/21 after being treated for pneumonia and sepsis, course was complicated by urinary retention for which he was discharged with Foley.  He was seen by urology on 10/28 for voiding trials which were unsuccessful.  Foley catheter was replaced.  Presents 10/31 with SIRS/sepsis/septic shock presumptively urinary tract source critical care asked to admit.  History of present illness   This is a 69 year old male patient with history as mentioned below.  Just discharged from the hospital on 10/21 after being admitted for sepsis, pneumonia, thrombocytopenia, and encephalopathy.  Hospital course complicated by urinary retention for which he was sent home with urinary catheter.  In regards to his Malignancy he is typically followed by Dr. Alvy Bimler, it appears as though his last visit was on 9/10 with notes of being observation on acalabrutinib.  He was discharged home, had noted follow-up at urology where he underwent unsuccessful voiding trials on 10/28 and therefore his Foley catheter was replaced with notable hematuria following this visit.  He presents to the emergency room on 10/31 after wife called EMS reporting confusion and altered sensorium.  On arrival to the emergency room his systolic blood pressure was in the 80s he was given 1 L of crystalloid, cultures were sent, and he was evaluated possible admission.  Further questioning, the wife notes low-grade fever for started on the evening of 10/30, fevers have been higher on 10/31 with patient more confused and exhibiting generalized weakness.  Apparently was placed on doxycycline for discomfort at the tip of his penis during the visit at urology.  While in the emergency  room empiric antibiotics initiated.  He had been receiving 30 mL/kg predicted body weight IV fluids and in spite of this had persistent hypotension with systolic blood pressure in the 70s and ongoing confusion and because of this critical care was asked to admit.  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  Recovered from COVID-19 infection September 2021. -> Most recently discharged from hospital on 10/21 after being admitted for working diagnosis of sepsis in the setting of community-acquired pneumonia complicated by metabolic encephalopathy which resolved, thrombocytopenia, leukopenia, and acute urinary retention for which she was sent home with urinary catheter  Significant Hospital Events   10/31 admitted w/ w-dx urosepsis  Consults:    Procedures:  12/17/2019>> Echo >>  Significant Diagnostic Tests:  10/31 CT Head WO No acute intracranial abnormalities. Mild chronic microvascular ischemic change. Sinus mucosal thickening as detailed above, unchanged compared to the recent prior head CT.  Micro Data:  Urine 10/31>>>Pseudamonas Aeruginosa Blood 10/31>>>  Antimicrobials:  Cefepime 10/31>>>  Interim history/subjective:  Started Midodrine 11/1, currently off pressors Awake and alert,  Net negative 460 cc's Afebrile WBC is 11.3, Platelets 81,000 PCT is 4.01  Objective   Blood pressure 114/62, pulse (!) 57, temperature 98 F (36.7 C), temperature source Oral, resp. rate 14, height 6' (1.829 m), weight 79.4 kg, SpO2 95 %.        Intake/Output Summary (Last 24 hours) at 12/17/2019 0914 Last data filed at 12/17/2019 0600 Gross per 24 hour  Intake 592.36 ml  Output 3290 ml  Net -2697.64 ml  Filed Weights   12/15/19 0723 12/16/19 0430 12/17/19 0435  Weight: 75 kg 80.1 kg 79.4 kg    Examination: General: 69 year old chronically ill male awake and alert, in NAD  HENT: NCAT , No JVD,  No LAD Lungs: Bilateral chest excursion, Clear throughout Cardiovascular: S1, S2,  RRR no MRG Abdomen: soft , ND, NT, BS + Extremities: warm and dry. Some pitting edema LEs brisk CR good pulses.  Scattered areas of ecchymosis  Neuro: awake , alert and oriented, appropriate GU: Clear amber   Resolved Hospital Problem list     Assessment & Plan:  Severe sepsis septic shock; presumptively UT source in setting of indwelling Foley catheter (chronic for Urinary retention) Started midodrine 11/1 Plan Cont IVFs F/u cultures Trend fever, CBC Continue IV cefepime Trend  LA Hold antihypertensives Continue Midodrine Received stress steroids>> off further steroids 2 D Echo today  Acute metabolic encephalopathy>> resolving Plan Supportive care  Anemia of chronic disease Plan Trend cbc. Transfuse for hgb < 7  Thrombocytopenia->actually improved from nadar last hospital admit Continues to improve Plan scds  H/o CLL Plan Hold acalabrutinib Will need to notify onc this week of admit  Urinary retention -foley just replaced 10/28 Plan Continue  foley   H/o COPD, recent PNA  pcxr  w/ improved aeration on left lung airspace disease  Plan Cont Home BDs Follow up CXR prn  DM Plan Holding metformin CBG/ SSI  Much more alert, currently off pressors May be able to transfer out later this afternoon  if can maintain pressures off levo  Best practice:  Diet: advance as tol Pain/Anxiety/Delirium protocol (if indicated): NA VAP protocol (if indicated): NA DVT prophylaxis: scd GI prophylaxis: NA Glucose control: ssi Mobility: BR Code Status: full code  Family Communication: wife [name] [phone] has requested daily call if no face to face. Updated in full 11/2 Disposition: to ICU   Labs   CBC: Recent Labs  Lab 12/15/19 0647 12/15/19 1005 12/16/19 0400  WBC 5.5  --  11.3*  NEUTROABS 3.4  --   --   HGB 11.9* 8.2* 10.7*  HCT 38.6* 24.0* 33.7*  MCV 106.6*  --   102.4*  PLT 62*  --  81*    Basic Metabolic Panel: Recent Labs  Lab 12/15/19 0647 12/15/19 1005 12/16/19 0400  NA 139 136 138  K 5.1 4.3 4.4  CL 105  --  107  CO2 24  --  24  GLUCOSE 95  --  211*  BUN 11  --  17  CREATININE 0.88  --  0.85  CALCIUM 8.3*  --  8.1*  MG  --   --  1.9   GFR: Estimated Creatinine Clearance: 91.3 mL/min (by C-G formula based on SCr of 0.85 mg/dL). Recent Labs  Lab 12/15/19 0647 12/15/19 1022 12/15/19 1153 12/15/19 1446 12/15/19 2048 12/16/19 0400 12/17/19 0408  PROCALCITON  --  1.66  --   --   --  4.82 4.01  WBC 5.5  --   --   --   --  11.3*  --   LATICACIDVEN 1.2  --  0.8 6.0* 1.9  --   --     Liver Function Tests: Recent Labs  Lab 12/15/19 0647  AST 19  ALT 11  ALKPHOS 97  BILITOT 0.9  PROT 4.8*  ALBUMIN 2.6*   No results for input(s): LIPASE, AMYLASE in the last 168 hours. No results for input(s): AMMONIA in the last 168 hours.  ABG  Component Value Date/Time   PHART 7.541 (H) 12/15/2019 1005   PCO2ART 29.8 (L) 12/15/2019 1005   PO2ART 78 (L) 12/15/2019 1005   HCO3 25.6 12/15/2019 1005   TCO2 26 12/15/2019 1005   O2SAT 97.0 12/15/2019 1005     Coagulation Profile: Recent Labs  Lab 12/15/19 0647  INR 1.2    Cardiac Enzymes: No results for input(s): CKTOTAL, CKMB, CKMBINDEX, TROPONINI in the last 168 hours.  HbA1C: Hgb A1c MFr Bld  Date/Time Value Ref Range Status  11/28/2019 07:22 PM 5.1 4.8 - 5.6 % Final    Comment:    (NOTE)         Prediabetes: 5.7 - 6.4         Diabetes: >6.4         Glycemic control for adults with diabetes: <7.0   04/13/2017 08:23 AM 6.2 (H) 4.8 - 5.6 % Final    Comment:             Prediabetes: 5.7 - 6.4          Diabetes: >6.4          Glycemic control for adults with diabetes: <7.0     CBG: Recent Labs  Lab 12/15/19 0642 12/15/19 1153 12/15/19 1339 12/15/19 2157 12/16/19 0733  GLUCAP 88 83 96 264* 149*     Past Medical History  He,  has a past medical history  of Anxiety (01/30/2014), Back injury, CAD (coronary artery disease), CLL (chronic lymphocytic leukemia) ([place]) (03/18/2011), COPD (chronic obstructive pulmonary disease) ([place]), Diabetes mellitus ([place]), ECRB (extensor carpi radialis brevis) tenosynovitis, GERD (gastroesophageal reflux disease), Hyperlipidemia, Lateral epicondylitis of left elbow, MI, acute, non ST segment elevation ([place]) (06/28/2009), Neuromuscular disorder ([place]), PVD (peripheral vascular disease) ([place]), and Tobacco abuse.   Surgical History    Past Surgical History:  Procedure Laterality Date  . ADENOIDECTOMY  1955  . CARDIAC CATHETERIZATION    . CARDIAC CATHETERIZATION N/A 09/26/2014   Procedure: Left Heart Cath and Coronary Angiography;  Surgeon: Peter M Martinique, MD;  Location: [facility];  Service: Cardiovascular;  Laterality: N/A;  . carpel tunnel release Left 04-1989  . carpel tunnel release  Right 01-1989  . CHOLECYSTECTOMY  2007  . CORONARY STENT PLACEMENT  May 2011  . femoral stents    . IR CV LINE INJECTION  08/18/2017  . IR CV LINE INJECTION  09/01/2017  . IR CV LINE INJECTION  02/02/2018  . LATERAL EPICONDYLE RELEASE Left 02/12/2014   Procedure: LEFT ELBOW DEBRIDEMENT WITH TENDON REPAIR ;  Surgeon: Lorn Junes, MD;  Location: [facility];  Service: Orthopedics;  Laterality: Left;  . LEFT CAI STENT/PTA AND POPLITEAL ARTERY/TIBIAL THROMBECTOMY     . LEFT HEART CATHETERIZATION WITH CORONARY ANGIOGRAM N/A 08/26/2011   Procedure: LEFT HEART CATHETERIZATION WITH CORONARY ANGIOGRAM;  Surgeon: Peter M Martinique, MD;  Location: [facility];  Service: Cardiovascular;  Laterality: N/A;  . PERIPHERAL VASCULAR CATHETERIZATION N/A 01/01/2015   Procedure: Abdominal Aortogram;  Surgeon: Conrad [place], MD;  Location: [facility];  Service: Cardiovascular;  Laterality: N/A;  . TARSAL TUNNEL RELEASE Bilateral 08-2007     Social History   reports that he quit smoking about 3 years ago. His smoking use included cigarettes. He has a  44.00 pack-year smoking history. He has quit using smokeless tobacco. He reports that he does not drink alcohol and does not use drugs.   Family History   His family history includes Cancer in his mother; Heart  disease in his father; Heart failure (age of onset: 36) in his father; Lung cancer (age of onset: 12) in his mother.   Allergies Allergies  Allergen Reactions  . Privigen [Immune Globulin] Hives    Pt reports hives/rash on body with IV Privigen. He was changed to IV Gamunex-C and had many infusions without adverse side effects.  . Codeine Hives    Pt states he can take a few, more reaction with extended doses.     Home Medications  Prior to Admission medications   Medication Sig Start Date End Date Taking? Authorizing Provider  acyclovir (ZOVIRAX) 400 MG tablet TAKE 1 TABLET BY MOUTH TWICE A DAY Patient taking differently: Take 400 mg by mouth 2 (two) times daily.  07/24/19   [name], MD  albuterol (PROVENTIL HFA) 108 (90 Base) MCG/ACT inhaler TAKE 2 PUFFS BY MOUTH EVERY 6 HOURS AS NEEDED FOR WHEEZE OR SHORTNESS OF BREATH Patient taking differently: Inhale 2 puffs into the lungs every 6 (six) hours as needed for wheezing or shortness of breath. TAKE 2 PUFFS BY MOUTH EVERY 6 HOURS AS NEEDED FOR WHEEZE OR SHORTNESS OF BREATH 06/27/19   Spero Geralds, MD  allopurinol (ZYLOPRIM) 300 MG tablet TAKE 1 TABLET BY MOUTH EVERY DAY Patient taking differently: Take 300 mg by mouth daily.  09/23/19   [name], MD  ALPRAZolam Duanne Moron) 0.25 MG tablet Take 0.5 mg by mouth as needed for anxiety.     [provider]  amoxicillin-clavulanate (AUGMENTIN) 875-125 MG tablet Take 1 tablet by mouth every 12 (twelve) hours. 12/05/19   Charlynne Cousins, MD  b complex vitamins tablet Take 1 tablet by mouth daily.     [provider]  benzonatate (TESSALON) 100 MG capsule Take 2 capsules (200 mg total) by mouth 3 (three) times daily as needed for cough. 11/14/19   Spero Geralds, MD    CALQUENCE 100 MG capsule TAKE 100 MG BY MOUTH 2 (TWO) TIMES DAILY. Patient taking differently: Take 100 mg by mouth 2 (two) times daily.  04/11/19   [name], MD  Cholecalciferol (VITAMIN D-1000 MAX ST) 1000 units tablet Take 1,000 Units by mouth daily.     [provider]  famotidine (PEPCID) 20 MG tablet TAKE 1 TABLET BY MOUTH TWICE A DAY Patient taking differently: Take 20 mg by mouth 2 (two) times daily as needed for heartburn.  02/11/19   [name], MD  fluticasone furoate-vilanterol (BREO ELLIPTA) 200-25 MCG/INH AEPB Inhale 1 puff into the lungs daily. 06/27/19   Spero Geralds, MD  furosemide (LASIX) 20 MG tablet TAKE 1 TABLET BY MOUTH EVERY DAY AS NEEDED Patient taking differently: Take 20 mg by mouth daily as needed for fluid or edema.  04/23/19   Burtis Junes, NP  HYDROcodone-acetaminophen (NORCO) 10-325 MG tablet Take 1 tablet by mouth See admin instructions. Every 6 to 8 hours as needed for pain 04/14/15   [provider]  HYDROcodone-homatropine (HYCODAN) 5-1.5 MG/5ML syrup Take 5 mLs by mouth every 6 (six) hours as needed for cough. 11/21/19   [provider]  lidocaine-prilocaine (EMLA) cream APPLY TO AFFECTED AREA ONCE Patient taking differently: Apply 1 application topically once.  12/21/17   Nicholas Lose, MD  loperamide (IMODIUM) 2 MG capsule Take 2 capsules (4 mg total) by mouth every 6 (six) hours as needed for diarrhea or loose stools. 10/25/19   [name], MD  meclizine (ANTIVERT) 12.5 MG tablet Take 12.5 mg by mouth every 6 (six)  hours as needed for dizziness.  12/24/14   [provider]  metFORMIN (GLUCOPHAGE-XR) 500 MG 24 hr tablet Take 500-1,000 mg by mouth See admin instructions. Takes one tablet (500mg )  in AM and 2 tablets (1000mg ) at night.    [provider]  mometasone (NASONEX) 50 MCG/ACT nasal spray Place 2 sprays into the nose daily. 11/08/19   Spero Geralds, MD  montelukast (SINGULAIR) 10 MG tablet TAKE 1 TABLET  BY MOUTH EVERY DAY 12/11/19   Spero Geralds, MD  nitroGLYCERIN (NITROSTAT) 0.4 MG SL tablet PLACE 1 TABLET UNDER THE TONGUE EVERY 5 MINUTES X 3 DOSES AS NEEDED FOR CHEST PAIN *MAX 3 DOSES* Patient taking differently: Place 0.4 mg under the tongue See admin instructions. PLACE 1 TABLET UNDER THE TONGUE EVERY 5 MINUTES X 3 DOSES AS NEEDED FOR CHEST PAIN *MAX 3 DOSES* 10/03/19   Burtis Junes, NP  predniSONE (DELTASONE) 20 MG tablet Take 20 mg by mouth See admin instructions. Take 1 tablet (20mg ) by mouth three times daily for 3 days, then take 1 tablet (20mg ) by mouth twice daily for 3 days, then take 1 tablet by mouth daily for 3 days. 11/20/19   [provider]  pregabalin (LYRICA) 200 MG capsule TAKE 1 CAPSULE (200 MG TOTAL) BY MOUTH 3 (THREE) TIMES DAILY. 09/02/19   Suzzanne Cloud, NP  rosuvastatin (CRESTOR) 5 MG tablet TAKE 1 TABLET BY MOUTH EVERY DAY Patient taking differently: Take 5 mg by mouth daily.  07/24/19   Burtis Junes, NP  tamsulosin (FLOMAX) 0.4 MG CAPS capsule Take 1 capsule (0.4 mg total) by mouth daily. 12/06/19   Charlynne Cousins, MD  zolpidem (AMBIEN) 10 MG tablet Take 10 mg by mouth at bedtime as needed for sleep.    [provider]     Critical care time: 24 min     Magdalen Spatz, MSN, AGACNP-BC [place] for personal pager PCCM on call pager [phone] Pager # [phone] OR # [phone] if no answer

## 2019-12-17 NOTE — Progress Notes (Signed)
[facility] Progress Note Patient Name: Ryan Copeland DOB: [birth year] MRN: [id number]   Date of Service  12/17/2019  HPI/Events of Note  Hypotension - BP = 70/39 with MAP = 50. Patient has implanted R chest port.  eICU Interventions  Plan: 1. Norepinephrine IV infusion. Titrate to MAP >= 65.      Intervention Category Major Interventions: Hypotension - evaluation and management  Zavier Canela Eugene 12/17/2019, 8:29 PM

## 2019-12-18 ENCOUNTER — Ambulatory Visit: Payer: Medicare Other | Admitting: Nurse Practitioner

## 2019-12-18 ENCOUNTER — Inpatient Hospital Stay (HOSPITAL_COMMUNITY): Payer: Medicare Other

## 2019-12-18 DIAGNOSIS — J449 Chronic obstructive pulmonary disease, unspecified: Secondary | ICD-10-CM

## 2019-12-18 DIAGNOSIS — I959 Hypotension, unspecified: Secondary | ICD-10-CM | POA: Diagnosis not present

## 2019-12-18 DIAGNOSIS — C9110 Chronic lymphocytic leukemia of B-cell type not having achieved remission: Secondary | ICD-10-CM | POA: Diagnosis not present

## 2019-12-18 DIAGNOSIS — N390 Urinary tract infection, site not specified: Secondary | ICD-10-CM | POA: Diagnosis not present

## 2019-12-18 LAB — CBC
HCT: 29 % — ABNORMAL LOW (ref 39.0–52.0)
Hemoglobin: 9.3 g/dL — ABNORMAL LOW (ref 13.0–17.0)
MCH: 32.5 pg (ref 26.0–34.0)
MCHC: 32.1 g/dL (ref 30.0–36.0)
MCV: 101.4 fL — ABNORMAL HIGH (ref 80.0–100.0)
Platelets: 50 10*3/uL — ABNORMAL LOW (ref 150–400)
RBC: 2.86 MIL/uL — ABNORMAL LOW (ref 4.22–5.81)
RDW: 16.1 % — ABNORMAL HIGH (ref 11.5–15.5)
WBC: 1.8 10*3/uL — ABNORMAL LOW (ref 4.0–10.5)
nRBC: 0 % (ref 0.0–0.2)

## 2019-12-18 LAB — COMPREHENSIVE METABOLIC PANEL
ALT: 15 U/L (ref 0–44)
AST: 16 U/L (ref 15–41)
Albumin: 1.9 g/dL — ABNORMAL LOW (ref 3.5–5.0)
Alkaline Phosphatase: 58 U/L (ref 38–126)
Anion gap: 6 (ref 5–15)
BUN: 11 mg/dL (ref 8–23)
CO2: 26 mmol/L (ref 22–32)
Calcium: 8.2 mg/dL — ABNORMAL LOW (ref 8.9–10.3)
Chloride: 108 mmol/L (ref 98–111)
Creatinine, Ser: 0.63 mg/dL (ref 0.61–1.24)
GFR, Estimated: >60 mL/min (ref >60)
Glucose, Bld: 109 mg/dL — ABNORMAL HIGH (ref 70–99)
Potassium: 3.4 mmol/L — ABNORMAL LOW (ref 3.5–5.1)
Sodium: 140 mmol/L (ref 135–145)
Total Bilirubin: 0.4 mg/dL (ref 0.3–1.2)
Total Protein: 3.9 g/dL — ABNORMAL LOW (ref 6.5–8.1)

## 2019-12-18 LAB — MAGNESIUM: Magnesium: 1.6 mg/dL — ABNORMAL LOW (ref 1.7–2.4)

## 2019-12-18 MED ORDER — MAGNESIUM SULFATE 4 GM/100ML IV SOLN
4.0000 g | Freq: Once | INTRAVENOUS | Status: AC
  Administered 2019-12-18: 4 g via INTRAVENOUS
  Filled 2019-12-18: qty 100

## 2019-12-18 MED ORDER — POTASSIUM CHLORIDE CRYS ER 20 MEQ PO TBCR
40.0000 meq | EXTENDED_RELEASE_TABLET | Freq: Once | ORAL | Status: AC
  Administered 2019-12-18: 40 meq via ORAL
  Filled 2019-12-18: qty 2

## 2019-12-18 MED ORDER — FAMOTIDINE 20 MG PO TABS: 20.0000 mg | ORAL_TABLET | Freq: Two times a day (BID) | ORAL | Status: DC | PRN

## 2019-12-18 NOTE — Progress Notes (Signed)
NAME:  Ryan Copeland, MRN:  [id number], DOB:  08/31/1950, LOS: 3 ADMISSION DATE:  12/15/2019, CONSULTATION DATE:  10/31 REFERRING MD:  Tivis Ringer, CHIEF COMPLAINT:  Sepsis   Brief History   69 year old male with history of CLL being maintained on acalabrutinib, just discharged from the hospital 10/21 after being treated for pneumonia and sepsis, course was complicated by urinary retention for which he was discharged with Foley.  He was seen by urology on 10/28 for voiding trials which were unsuccessful.  Foley catheter was replaced.  Presents 10/31 with SIRS/sepsis/septic shock presumptively urinary tract source critical care asked to admit.  History of present illness   This is a 69 year old male patient with history as mentioned below.  Just discharged from the hospital on 10/21 after being admitted for sepsis, pneumonia, thrombocytopenia, and encephalopathy.  Hospital course complicated by urinary retention for which he was sent home with urinary catheter.  In regards to his Malignancy he is typically followed by Dr. Alvy Bimler, it appears as though his last visit was on 9/10 with notes of being observation on acalabrutinib.  He was discharged home, had noted follow-up at urology where he underwent unsuccessful voiding trials on 10/28 and therefore his Foley catheter was replaced with notable hematuria following this visit.  He presents to the emergency room on 10/31 after wife called EMS reporting confusion and altered sensorium.  On arrival to the emergency room his systolic blood pressure was in the 80s he was given 1 L of crystalloid, cultures were sent, and he was evaluated possible admission.  Further questioning, the wife notes low-grade fever for started on the evening of 10/30, fevers have been higher on 10/31 with patient more confused and exhibiting generalized weakness.  Apparently was placed on doxycycline for discomfort at the tip of his penis during the visit at urology.  While in the emergency  room empiric antibiotics initiated.  He had been receiving 30 mL/kg predicted body weight IV fluids and in spite of this had persistent hypotension with systolic blood pressure in the 70s and ongoing confusion and because of this critical care was asked to admit.  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  Recovered from COVID-19 infection September 2021. -> Most recently discharged from hospital on 10/21 after being admitted for working diagnosis of sepsis in the setting of community-acquired pneumonia complicated by metabolic encephalopathy which resolved, thrombocytopenia, leukopenia, and acute urinary retention for which she was sent home with urinary catheter  Significant Hospital Events   10/31 admitted w/ w-dx urosepsis  Consults:    Procedures:  12/17/2019>> Echo >>  Significant Diagnostic Tests:  10/31 CT Head WO No acute intracranial abnormalities. Mild chronic microvascular ischemic change. Sinus mucosal thickening as detailed above, unchanged compared to the recent prior head CT.  Micro Data:  Urine 10/31>>>Pseudamonas Aeruginosa Blood 10/31>>>  Antimicrobials:  Cefepime 10/31>>>  Interim history/subjective:  Started Midodrine 11/1, currently off pressors Awake and alert,  Net negative 460 cc's Afebrile WBC is 11.3, Platelets 81,000 PCT is 4.01  Objective   Blood pressure (!) 105/54, pulse 87, temperature 98.4 F (36.9 C), temperature source Oral, resp. rate 17, height 6' (1.829 m), weight 80.7 kg, SpO2 92 %.        Intake/Output Summary (Last 24 hours) at 12/18/2019 1143 Last data filed at 12/18/2019 0700 Gross per 24 hour  Intake 872.08 ml  Output 2775 ml  Net -1902.92 ml  Filed Weights   12/16/19 0430 12/17/19 0435 12/18/19 0500  Weight: 80.1 kg 79.4 kg 80.7 kg    Examination: General: 69 year old chronically ill male awake and alert, in NAD  HENT: NCAT , No  JVD, No LAD Lungs: Bilateral chest excursion, Clear throughout Cardiovascular: S1, S2,  RRR no MRG Abdomen: soft , ND, NT, BS + Extremities: warm and dry.   Scattered areas of ecchymosis  Neuro: awake , alert and oriented, appropriate GU: Clear urine  Resolved Hospital Problem list   encephalopathy  Assessment & Plan:  Severe sepsis septic shock; presumptively UT source in setting of indwelling Foley catheter (chronic for Urinary retention)  Plan Shock resolved. Continue IV cefepime Hold antihypertensives Started midodrine 11/1. This is not a home medication and should be titrated off prior to discharge. Echo reviewed. Unchanged significantly from prior.  Has hypotension when sleeping and asymptomatic.his goal MAP should be >50 when sleeping  Anemia of chronic disease Plan Trend cbc. Transfuse for hgb < 7  Thrombocytopenia->actually improved from nadir last hospital admit Continues to improve Plan scds  H/o CLL Plan Hold acalabrutinib. He can resume once off abx.   Urinary retention -foley just replaced 10/28 Plan Continue  foley  H/o COPD, recent PNA  pcxr  w/ improved aeration on left lung airspace disease  Plan Cont Home BDs   DM Plan Holding metformin CBG/ SSI   Best practice:  Diet: regular diet Pain/Anxiety/Delirium protocol (if indicated): NA VAP protocol (if indicated): NA DVT prophylaxis: scd GI prophylaxis: NA Glucose control: ssi Mobility: BR Code Status: full code  Family Communication: patient updated.  Disposition: ok for transfer to RNF. Will s/o to [facility] to assume care 11/4. PCCM to sign off  Labs   CBC: Recent Labs  Lab 12/15/19 0647 12/15/19 1005 12/16/19 0400 12/17/19 1220 12/18/19 0504  WBC 5.5  --  11.3* 2.0* 1.8*  NEUTROABS 3.4  --   --   --   --   HGB 11.9* 8.2* 10.7* 8.5* 9.3*  HCT 38.6* 24.0* 33.7* 26.5* 29.0*  MCV 106.6*  --  102.4* 103.9* 101.4*  PLT 62*  --  81* 46* 50*    Basic Metabolic Panel: Recent Labs    Lab 12/15/19 0647 12/15/19 1005 12/16/19 0400 12/17/19 1220 12/18/19 0504  NA 139 136 138 142 140  K 5.1 4.3 4.4 2.8* 3.4*  CL 105  --  107 114* 108  CO2 24  --  24 24 26   GLUCOSE 95  --  211* 83 109*  BUN 11  --  17 12 11   CREATININE 0.88  --  0.85 0.53* 0.63  CALCIUM 8.3*  --  8.1* 6.9* 8.2*  MG  --   --  1.9  --  1.6*   GFR: Estimated Creatinine Clearance: 97 mL/min (by C-G formula based on SCr of 0.63 mg/dL). Recent Labs  Lab 12/15/19 0647 12/15/19 1022 12/15/19 1153 12/15/19 1446 12/15/19 2048 12/16/19 0400 12/17/19 0408 12/17/19 1220 12/18/19 0504  PROCALCITON  --  1.66  --   --   --  4.82 4.01  --   --   WBC 5.5  --   --   --   --  11.3*  --  2.0* 1.8*  LATICACIDVEN 1.2  --  0.8 6.0* 1.9  --   --   --   --     Liver Function Tests: Recent Labs  Lab 12/15/19 0647 12/18/19 0504  AST 19 16  ALT 11 15  ALKPHOS 97 58  BILITOT 0.9 0.4  PROT 4.8* 3.9*  ALBUMIN 2.6* 1.9*   No results for input(s): LIPASE, AMYLASE in the last 168 hours. No results for input(s): AMMONIA in the last 168 hours.  ABG    Component Value Date/Time   PHART 7.541 (H) 12/15/2019 1005   PCO2ART 29.8 (L) 12/15/2019 1005   PO2ART 78 (L) 12/15/2019 1005   HCO3 25.6 12/15/2019 1005   TCO2 26 12/15/2019 1005   O2SAT 97.0 12/15/2019 1005     Coagulation Profile: Recent Labs  Lab 12/15/19 0647  INR 1.2    Cardiac Enzymes: No results for input(s): CKTOTAL, CKMB, CKMBINDEX, TROPONINI in the last 168 hours.  HbA1C: Hgb A1c MFr Bld  Date/Time Value Ref Range Status  11/28/2019 07:22 PM 5.1 4.8 - 5.6 % Final    Comment:    (NOTE)         Prediabetes: 5.7 - 6.4         Diabetes: >6.4         Glycemic control for adults with diabetes: <7.0   04/13/2017 08:23 AM 6.2 (H) 4.8 - 5.6 % Final    Comment:             Prediabetes: 5.7 - 6.4          Diabetes: >6.4          Glycemic control for adults with diabetes: <7.0     CBG: Recent Labs  Lab 12/15/19 0642 12/15/19 1153  12/15/19 1339 12/15/19 2157 12/16/19 0733  GLUCAP 88 83 96 264* 149*     Past Medical History  He,  has a past medical history of Anxiety (01/30/2014), Back injury, CAD (coronary artery disease), CLL (chronic lymphocytic leukemia) ([place]) (03/18/2011), COPD (chronic obstructive pulmonary disease) ([place]), Diabetes mellitus ([place]), ECRB (extensor carpi radialis brevis) tenosynovitis, GERD (gastroesophageal reflux disease), Hyperlipidemia, Lateral epicondylitis of left elbow, MI, acute, non ST segment elevation ([place]) (06/28/2009), Neuromuscular disorder ([place]), PVD (peripheral vascular disease) ([place]), and Tobacco abuse.   Surgical History    Past Surgical History:  Procedure Laterality Date  . ADENOIDECTOMY  1955  . CARDIAC CATHETERIZATION    . CARDIAC CATHETERIZATION N/A 09/26/2014   Procedure: Left Heart Cath and Coronary Angiography;  Surgeon: Peter M Martinique, MD;  Location: [facility];  Service: Cardiovascular;  Laterality: N/A;  . carpel tunnel release Left 04-1989  . carpel tunnel release  Right 01-1989  . CHOLECYSTECTOMY  2007  . CORONARY STENT PLACEMENT  May 2011  . femoral stents    . IR CV LINE INJECTION  08/18/2017  . IR CV LINE INJECTION  09/01/2017  . IR CV LINE INJECTION  02/02/2018  . LATERAL EPICONDYLE RELEASE Left 02/12/2014   Procedure: LEFT ELBOW DEBRIDEMENT WITH TENDON REPAIR ;  Surgeon: Lorn Junes, MD;  Location: [facility];  Service: Orthopedics;  Laterality: Left;  . LEFT CAI STENT/PTA AND POPLITEAL ARTERY/TIBIAL THROMBECTOMY     . LEFT HEART CATHETERIZATION WITH CORONARY ANGIOGRAM N/A 08/26/2011   Procedure: LEFT HEART CATHETERIZATION WITH CORONARY ANGIOGRAM;  Surgeon: Peter M Martinique, MD;  Location: [facility];  Service: Cardiovascular;  Laterality: N/A;  . PERIPHERAL VASCULAR CATHETERIZATION N/A 01/01/2015   Procedure: Abdominal Aortogram;  Surgeon: Conrad Montebello, MD;  Location: [place] [facility];  Service: Cardiovascular;  Laterality: N/A;  . TARSAL TUNNEL RELEASE  Bilateral 08-2007     Social History   reports that he quit smoking about 3 years ago. His smoking use  included cigarettes. He has a 44.00 pack-year smoking history. He has quit using smokeless tobacco. He reports that he does not drink alcohol and does not use drugs.   Family History   His family history includes Cancer in his mother; Heart disease in his father; Heart failure (age of onset: 49) in his father; Lung cancer (age of onset: 29) in his mother.   Allergies Allergies  Allergen Reactions  . Privigen [Immune Globulin] Hives    Pt reports hives/rash on body with IV Privigen. He was changed to IV Gamunex-C and had many infusions without adverse side effects.  . Codeine Hives    Pt states he can take a few, more reaction with extended doses.     Home Medications  Prior to Admission medications   Medication Sig Start Date End Date Taking? Authorizing Provider  acyclovir (ZOVIRAX) 400 MG tablet TAKE 1 TABLET BY MOUTH TWICE A DAY Patient taking differently: Take 400 mg by mouth 2 (two) times daily.  07/24/19   [name], MD  albuterol (PROVENTIL HFA) 108 (90 Base) MCG/ACT inhaler TAKE 2 PUFFS BY MOUTH EVERY 6 HOURS AS NEEDED FOR WHEEZE OR SHORTNESS OF BREATH Patient taking differently: Inhale 2 puffs into the lungs every 6 (six) hours as needed for wheezing or shortness of breath. TAKE 2 PUFFS BY MOUTH EVERY 6 HOURS AS NEEDED FOR WHEEZE OR SHORTNESS OF BREATH 06/27/19   Spero Geralds, MD  allopurinol (ZYLOPRIM) 300 MG tablet TAKE 1 TABLET BY MOUTH EVERY DAY Patient taking differently: Take 300 mg by mouth daily.  09/23/19   [name], MD  ALPRAZolam Duanne Moron) 0.25 MG tablet Take 0.5 mg by mouth as needed for anxiety.     [provider]  amoxicillin-clavulanate (AUGMENTIN) 875-125 MG tablet Take 1 tablet by mouth every 12 (twelve) hours. 12/05/19   Charlynne Cousins, MD  b complex vitamins tablet Take 1 tablet by mouth daily.     [provider]  benzonatate  (TESSALON) 100 MG capsule Take 2 capsules (200 mg total) by mouth 3 (three) times daily as needed for cough. 11/14/19   Spero Geralds, MD  CALQUENCE 100 MG capsule TAKE 100 MG BY MOUTH 2 (TWO) TIMES DAILY. Patient taking differently: Take 100 mg by mouth 2 (two) times daily.  04/11/19   [name], MD  Cholecalciferol (VITAMIN D-1000 MAX ST) 1000 units tablet Take 1,000 Units by mouth daily.     [provider]  famotidine (PEPCID) 20 MG tablet TAKE 1 TABLET BY MOUTH TWICE A DAY Patient taking differently: Take 20 mg by mouth 2 (two) times daily as needed for heartburn.  02/11/19   [name], MD  fluticasone furoate-vilanterol (BREO ELLIPTA) 200-25 MCG/INH AEPB Inhale 1 puff into the lungs daily. 06/27/19   Spero Geralds, MD  furosemide (LASIX) 20 MG tablet TAKE 1 TABLET BY MOUTH EVERY DAY AS NEEDED Patient taking differently: Take 20 mg by mouth daily as needed for fluid or edema.  04/23/19   Burtis Junes, NP  HYDROcodone-acetaminophen (NORCO) 10-325 MG tablet Take 1 tablet by mouth See admin instructions. Every 6 to 8 hours as needed for pain 04/14/15   [provider]  HYDROcodone-homatropine (HYCODAN) 5-1.5 MG/5ML syrup Take 5 mLs by mouth every 6 (six) hours as needed for cough. 11/21/19   [provider]  lidocaine-prilocaine (EMLA) cream APPLY TO AFFECTED AREA ONCE Patient taking differently: Apply 1 application topically once.  12/21/17   Nicholas Lose, MD  loperamide (IMODIUM)  2 MG capsule Take 2 capsules (4 mg total) by mouth every 6 (six) hours as needed for diarrhea or loose stools. 10/25/19   [name], MD  meclizine (ANTIVERT) 12.5 MG tablet Take 12.5 mg by mouth every 6 (six) hours as needed for dizziness.  12/24/14   [provider]  metFORMIN (GLUCOPHAGE-XR) 500 MG 24 hr tablet Take 500-1,000 mg by mouth See admin instructions. Takes one tablet (500mg )  in AM and 2 tablets (1000mg ) at night.    [provider]  mometasone (NASONEX) 50  MCG/ACT nasal spray Place 2 sprays into the nose daily. 11/08/19   Spero Geralds, MD  montelukast (SINGULAIR) 10 MG tablet TAKE 1 TABLET BY MOUTH EVERY DAY 12/11/19   Spero Geralds, MD  nitroGLYCERIN (NITROSTAT) 0.4 MG SL tablet PLACE 1 TABLET UNDER THE TONGUE EVERY 5 MINUTES X 3 DOSES AS NEEDED FOR CHEST PAIN *MAX 3 DOSES* Patient taking differently: Place 0.4 mg under the tongue See admin instructions. PLACE 1 TABLET UNDER THE TONGUE EVERY 5 MINUTES X 3 DOSES AS NEEDED FOR CHEST PAIN *MAX 3 DOSES* 10/03/19   Burtis Junes, NP  predniSONE (DELTASONE) 20 MG tablet Take 20 mg by mouth See admin instructions. Take 1 tablet (20mg ) by mouth three times daily for 3 days, then take 1 tablet (20mg ) by mouth twice daily for 3 days, then take 1 tablet by mouth daily for 3 days. 11/20/19   [provider]  pregabalin (LYRICA) 200 MG capsule TAKE 1 CAPSULE (200 MG TOTAL) BY MOUTH 3 (THREE) TIMES DAILY. 09/02/19   Suzzanne Cloud, NP  rosuvastatin (CRESTOR) 5 MG tablet TAKE 1 TABLET BY MOUTH EVERY DAY Patient taking differently: Take 5 mg by mouth daily.  07/24/19   Burtis Junes, NP  tamsulosin (FLOMAX) 0.4 MG CAPS capsule Take 1 capsule (0.4 mg total) by mouth daily. 12/06/19   Charlynne Cousins, MD  zolpidem (AMBIEN) 10 MG tablet Take 10 mg by mouth at bedtime as needed for sleep.    [provider]

## 2019-12-18 NOTE — Progress Notes (Signed)
Report given to 2W RN. [name] (wife) notified of patient transfer. All belongings sent.

## 2019-12-19 LAB — CBC
HCT: 32.1 % — ABNORMAL LOW (ref 39.0–52.0)
Hemoglobin: 10.3 g/dL — ABNORMAL LOW (ref 13.0–17.0)
MCH: 33.1 pg (ref 26.0–34.0)
MCHC: 32.1 g/dL (ref 30.0–36.0)
MCV: 103.2 fL — ABNORMAL HIGH (ref 80.0–100.0)
Platelets: 54 10*3/uL — ABNORMAL LOW (ref 150–400)
RBC: 3.11 MIL/uL — ABNORMAL LOW (ref 4.22–5.81)
RDW: 16.1 % — ABNORMAL HIGH (ref 11.5–15.5)
WBC: 2.9 10*3/uL — ABNORMAL LOW (ref 4.0–10.5)
nRBC: 0 % (ref 0.0–0.2)

## 2019-12-19 LAB — BASIC METABOLIC PANEL
Anion gap: 7 (ref 5–15)
BUN: 10 mg/dL (ref 8–23)
CO2: 25 mmol/L (ref 22–32)
Calcium: 8 mg/dL — ABNORMAL LOW (ref 8.9–10.3)
Chloride: 107 mmol/L (ref 98–111)
Creatinine, Ser: 0.68 mg/dL (ref 0.61–1.24)
GFR, Estimated: >60 mL/min (ref >60)
Glucose, Bld: 155 mg/dL — ABNORMAL HIGH (ref 70–99)
Potassium: 3.7 mmol/L (ref 3.5–5.1)
Sodium: 139 mmol/L (ref 135–145)

## 2019-12-19 MED ORDER — POTASSIUM CHLORIDE CRYS ER 20 MEQ PO TBCR
40.0000 meq | EXTENDED_RELEASE_TABLET | Freq: Once | ORAL | Status: AC
  Administered 2019-12-19: 40 meq via ORAL
  Filled 2019-12-19: qty 2

## 2019-12-19 MED ORDER — MAGNESIUM SULFATE 2 GM/50ML IV SOLN
2.0000 g | Freq: Once | INTRAVENOUS | Status: AC
  Administered 2019-12-19: 2 g via INTRAVENOUS
  Filled 2019-12-19: qty 50

## 2019-12-19 MED ORDER — ALTEPLASE 2 MG IJ SOLR
2.0000 mg | Freq: Once | INTRAMUSCULAR | Status: AC
  Administered 2019-12-20: 2 mg
  Filled 2019-12-19: qty 2

## 2019-12-19 NOTE — Progress Notes (Signed)
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  On presentation he was hypotensive.  Also reported low-grade fever at home.  On his last hospitalization, his hospital course was complicated by urinary retention and he was discharged on Foley catheter.  He was seen by urology on 10/28 for voiding trials but were unsuccessful and Foley was replaced.  On presentation, patient was in septic shock patient.  Started on IV antibiotics.  Patient was admitted under ICU service.  Patient transferred to [facility] service on 12/19/2019.  Assessment & Plan:   Active Problems:   Septic shock (HCC)   Severe sepsis/septic shock: Presented with low-grade fever, hypotension, altered mental status.  Source is UTI.  Patient has indwelling Foley catheter for urinary retention.  Urine culture showed resistant Pseudomonas.  Currently on cefepime.  Blood cultures have remained negative so far.  Hypotension: Blood pressure still soft.  Patient has been started on midodrine by PCCM.  Continue monitor blood pressure.  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  Denies any dizziness or lightheadedness.  No history of recent falls.  Pancytopenia: Currently has leukopenia, anemia, thrombocytopenia.  This could be associated with his history of CLL.  Continue monitor CBC.  CLL: On acalabrutinib.  Follows with cardiology.  Currently on hold because of sepsis.  Urine retention: Was discharged with Foley catheter on last hospitalization.  Follows with urology.  History of COPD: Continue bronchodilators as needed.  Continue supplemental oxygen if needed.  He was treated for pneumonia on his last admission.   Follow-up chest x-ray showed improved aeration on the left lung airspace disease.  Diabetes mellitus: Monitor blood sugars.  Continue sliding scale insulin.  Hypokalemia/hypomagnesemia: Being supplemented with potassium and magnesium.  Debility/deconditioning: We requested PT/OT evaluation.         DVT prophylaxis: SCD Code Status: Full Family Communication: Called wife on phone for update on 12/19/19 Status is: Inpatient  Remains inpatient appropriate because:Hemodynamically unstable   Dispo: The patient is from: Home              Anticipated d/c is to: Home              Anticipated d/c date is: 1-2 days              Patient currently is not medically stable to d/c. Blood pressure still soft, needs PT/OT evaluation before discharge.    Consultants: PCCM  Procedures:None  Antimicrobials:  Anti-infectives (From admission, onward)   Start     Dose/Rate Route Frequency Ordered Stop   12/15/19 1400  ceFEPIme (MAXIPIME) 2 g in sodium chloride 0.9 % 100 mL IVPB        2 g 200 mL/hr over 30 Minutes Intravenous Every 8 hours 12/15/19 1141 12/25/19 1359   12/15/19 0700  ceFEPIme (MAXIPIME) 2 g in sodium chloride 0.9 % 100 mL IVPB        2 g 200 mL/hr over 30 Minutes Intravenous  Once 12/15/19 0648 12/15/19 0817   12/15/19 0700  metroNIDAZOLE (FLAGYL) IVPB 500 mg        500 mg 100 mL/hr over 60 Minutes Intravenous  Once 12/15/19 0648 12/15/19 0850   12/15/19 0700  vancomycin (VANCOCIN)  IVPB 1000 mg/200 mL premix  Status:  Discontinued        1,000 mg 200 mL/hr over 60 Minutes Intravenous  Once 12/15/19 0648 12/15/19 0650   12/15/19 0700  vancomycin (VANCOREADY) IVPB 1500 mg/300 mL        1,500 mg 150 mL/hr over 120 Minutes Intravenous  Once 12/15/19 4268 12/15/19 1024      Subjective:  Patient seen and examined at the bedside this morning.  Blood pressure was soft but he was sitting on the bed, no report of lightheadedness or dizziness.  He says he feels fine and his  blood pressure is always low.  Eager to go home   Objective: Vitals:   12/18/19 0800 12/18/19 1212 12/18/19 1214 12/19/19 0500  BP:  (!) 80/52 (!) 88/50   Pulse: 87 68    Resp: 17 20    Temp:  97.7 F (36.5 C)    TempSrc:  Oral    SpO2: 92% 95%    Weight:    80.7 kg  Height:       No intake or output data in the 24 hours ending 12/19/19 0805 Filed Weights   12/17/19 0435 12/18/19 0500 12/19/19 0500  Weight: 79.4 kg 80.7 kg 80.7 kg    Examination:  General exam: Comfortable, debilitated, chronically looking HEENT:PERRL,Oral mucosa moist, Ear/Nose normal on gross exam Respiratory system: Bilateral equal air entry, normal vesicular breath sounds, no wheezes or crackles  Cardiovascular system: S1 & S2 heard, RRR. No JVD, murmurs, rubs, gallops or clicks. No pedal edema.  Port-A-Cath on the right chest Gastrointestinal system: Abdomen is nondistended, soft and nontender. No organomegaly or masses felt. Normal bowel sounds heard. Central nervous system: Alert and oriented. No focal neurological deficits. Extremities: No edema, no clubbing ,no cyanosis Skin: No rashes, lesions or ulcers,no icterus ,no pallor GU: Foley.     Data Reviewed: I have personally reviewed following labs and imaging studies  CBC: Recent Labs  Lab 12/15/19 0647 12/15/19 1005 12/16/19 0400 12/17/19 1220 12/18/19 0504  WBC 5.5  --  11.3* 2.0* 1.8*  NEUTROABS 3.4  --   --   --   --   HGB 11.9* 8.2* 10.7* 8.5* 9.3*  HCT 38.6* 24.0* 33.7* 26.5* 29.0*  MCV 106.6*  --  102.4* 103.9* 101.4*  PLT 62*  --  81* 46* 50*   Basic Metabolic Panel: Recent Labs  Lab 12/15/19 0647 12/15/19 1005 12/16/19 0400 12/17/19 1220 12/18/19 0504  NA 139 136 138 142 140  K 5.1 4.3 4.4 2.8* 3.4*  CL 105  --  107 114* 108  CO2 24  --  24 24 26   GLUCOSE 95  --  211* 83 109*  BUN 11  --  17 12 11   CREATININE 0.88  --  0.85 0.53* 0.63  CALCIUM 8.3*  --  8.1* 6.9* 8.2*  MG  --   --  1.9  --  1.6*   GFR: Estimated  Creatinine Clearance: 97 mL/min (by C-G formula based on SCr of 0.63 mg/dL). Liver Function Tests: Recent Labs  Lab 12/15/19 0647 12/18/19 0504  AST 19 16  ALT 11 15  ALKPHOS 97 58  BILITOT 0.9 0.4  PROT 4.8* 3.9*  ALBUMIN 2.6* 1.9*   No results for input(s): LIPASE, AMYLASE in the last 168 hours. No results for input(s): AMMONIA in the last 168 hours. Coagulation Profile: Recent Labs  Lab 12/15/19 0647  INR 1.2   Cardiac Enzymes: No results for input(s): CKTOTAL,  CKMB, CKMBINDEX, TROPONINI in the last 168 hours. BNP (last 3 results) No results for input(s): PROBNP in the last 8760 hours. HbA1C: No results for input(s): HGBA1C in the last 72 hours. CBG: Recent Labs  Lab 12/15/19 0642 12/15/19 1153 12/15/19 1339 12/15/19 2157 12/16/19 0733  GLUCAP 88 83 96 264* 149*   Lipid Profile: No results for input(s): CHOL, HDL, LDLCALC, TRIG, CHOLHDL, LDLDIRECT in the last 72 hours. Thyroid Function Tests: No results for input(s): TSH, T4TOTAL, FREET4, T3FREE, THYROIDAB in the last 72 hours. Anemia Panel: No results for input(s): VITAMINB12, FOLATE, FERRITIN, TIBC, IRON, RETICCTPCT in the last 72 hours. Sepsis Labs: Recent Labs  Lab 12/15/19 0647 12/15/19 1022 12/15/19 1153 12/15/19 1446 12/15/19 2048 12/16/19 0400 12/17/19 0408  PROCALCITON  --  1.66  --   --   --  4.82 4.01  LATICACIDVEN 1.2  --  0.8 6.0* 1.9  --   --     Recent Results (from the past 240 hour(s))  Blood Culture (routine x 2)     Status: None (Preliminary result)   Collection Time: 12/15/19  6:37 AM   Specimen: BLOOD  Result Value Ref Range Status   Specimen Description BLOOD LEFT ANTECUBITAL  Final   Special Requests   Final    BOTTLES DRAWN AEROBIC AND ANAEROBIC Blood Culture results may not be optimal due to an inadequate volume of blood received in culture bottles   Culture   Final    NO GROWTH 4 DAYS Performed at [facility] [address], [place], [place] [address]    Report  Status PENDING  Incomplete  Resp Panel by RT PCR (RSV, Flu A&B, Covid) - Nasopharyngeal Swab     Status: None   Collection Time: 12/15/19  6:37 AM   Specimen: Nasopharyngeal Swab  Result Value Ref Range Status   SARS Coronavirus 2 by RT PCR NEGATIVE NEGATIVE Final    Comment: (NOTE) SARS-CoV-2 target nucleic acids are NOT DETECTED.  The SARS-CoV-2 RNA is generally detectable in upper respiratoy specimens during the acute phase of infection. The lowest concentration of SARS-CoV-2 viral copies this assay can detect is 131 copies/mL. A negative result does not preclude SARS-Cov-2 infection and should not be used as the sole basis for treatment or other patient management decisions. A negative result may occur with  improper specimen collection/handling, submission of specimen other than nasopharyngeal swab, presence of viral mutation(s) within the areas targeted by this assay, and inadequate number of viral copies (<131 copies/mL). A negative result must be combined with clinical observations, patient history, and epidemiological information. The expected result is Negative.  Fact Sheet for Patients:  PinkCheek.be  Fact Sheet for Healthcare Providers:  GravelBags.it  This test is no t yet approved or cleared by the [place] FDA and  has been authorized for detection and/or diagnosis of SARS-CoV-2 by FDA under an Emergency Use Authorization (EUA). This EUA will remain  in effect (meaning this test can be used) for the duration of the COVID-19 declaration under Section 564(b)(1) of the Act, 21 U.S.C. section 360bbb-3(b)(1), unless the authorization is terminated or revoked sooner.     Influenza A by PCR NEGATIVE NEGATIVE Final   Influenza B by PCR NEGATIVE NEGATIVE Final    Comment: (NOTE) The Xpert Xpress SARS-CoV-2/FLU/RSV assay is intended as an aid in  the diagnosis of influenza from Nasopharyngeal swab specimens  and  should not be used as a sole basis for treatment. Nasal washings and  aspirates  are unacceptable for Xpert Xpress SARS-CoV-2/FLU/RSV  testing.  Fact Sheet for Patients: PinkCheek.be  Fact Sheet for Healthcare Providers: GravelBags.it  This test is not yet approved or cleared by the [place] FDA and  has been authorized for detection and/or diagnosis of SARS-CoV-2 by  FDA under an Emergency Use Authorization (EUA). This EUA will remain  in effect (meaning this test can be used) for the duration of the  Covid-19 declaration under Section 564(b)(1) of the Act, 21  U.S.C. section 360bbb-3(b)(1), unless the authorization is  terminated or revoked.    Respiratory Syncytial Virus by PCR NEGATIVE NEGATIVE Final    Comment: (NOTE) Fact Sheet for Patients: PinkCheek.be  Fact Sheet for Healthcare Providers: GravelBags.it  This test is not yet approved or cleared by the [place] FDA and  has been authorized for detection and/or diagnosis of SARS-CoV-2 by  FDA under an Emergency Use Authorization (EUA). This EUA will remain  in effect (meaning this test can be used) for the duration of the  COVID-19 declaration under Section 564(b)(1) of the Act, 21 U.S.C.  section 360bbb-3(b)(1), unless the authorization is terminated or  revoked. Performed at [facility], [place] [address], [place], [place] [address]   MRSA PCR Screening     Status: None   Collection Time: 12/15/19  6:37 AM   Specimen: Nasal Mucosa; Nasopharyngeal  Result Value Ref Range Status   MRSA by PCR NEGATIVE NEGATIVE Final    Comment:        The GeneXpert MRSA Assay (FDA approved for NASAL specimens only), is one component of a comprehensive MRSA colonization surveillance program. It is not intended to diagnose MRSA infection nor to guide or monitor treatment for MRSA  infections. Performed at [facility], [place] [address], [place], [place] [address]   Blood Culture (routine x 2)     Status: None (Preliminary result)   Collection Time: 12/15/19  7:22 AM   Specimen: BLOOD LEFT FOREARM  Result Value Ref Range Status   Specimen Description BLOOD LEFT FOREARM  Final   Special Requests   Final    BOTTLES DRAWN AEROBIC AND ANAEROBIC Blood Culture adequate volume   Culture   Final    NO GROWTH 4 DAYS Performed at [facility], [place] [address], [place], [place] [address]    Report Status PENDING  Incomplete  Urine culture     Status: Abnormal   Collection Time: 12/15/19  7:39 AM   Specimen: In/Out Cath Urine  Result Value Ref Range Status   Specimen Description IN/OUT CATH URINE  Final   Special Requests   Final    NONE Performed at [facility], [place] [address] [place], [place] [address]    Culture 40,000 COLONIES/mL PSEUDOMONAS AERUGINOSA (A)  Final   Report Status 12/17/2019 FINAL  Final   Organism ID, Bacteria PSEUDOMONAS AERUGINOSA (A)  Final      Susceptibility   Pseudomonas aeruginosa - MIC*    CEFTAZIDIME 2 SENSITIVE Sensitive     CIPROFLOXACIN >=4 RESISTANT Resistant     GENTAMICIN <=1 SENSITIVE Sensitive     IMIPENEM <=0.25 SENSITIVE Sensitive     PIP/TAZO <=4 SENSITIVE Sensitive     CEFEPIME Value in next row Sensitive      SENSITIVE8    * 40,000 COLONIES/mL PSEUDOMONAS AERUGINOSA         Radiology Studies: DG CHEST PORT 1 VIEW  Result Date: 12/18/2019 CLINICAL DATA:  Pneumonia EXAM: PORTABLE CHEST  1 VIEW COMPARISON:  12/15/2019 FINDINGS: Stable borderline heart size. Airspace opacity in the left more than right. Possible trace pleural fluid and new cephalized blood flow. No pneumothorax. Porta catheter on the right with tip at the upper cavoatrial junction. IMPRESSION: Unchanged pulmonary infiltrates. Possible interval vascular congestion. Electronically Signed   By: Monte Fantasia M.D.   On: 12/18/2019 07:00    ECHOCARDIOGRAM COMPLETE  Result Date: 12/17/2019    ECHOCARDIOGRAM REPORT   Patient Name:   Ryan Copeland Date of Exam: 12/17/2019 Medical Rec #:  [id number]     Height:       72.0 in Accession #:    [id number]    Weight:       175.0 lb Date of Birth:  31-Aug-1950     BSA:          2.013 m Patient Age:    45 years      BP:           114/62 mmHg Patient Gender: M             HR:           73 bpm. Exam Location:  Inpatient Procedure: 2D Echo, Cardiac Doppler and Color Doppler Indications:    Cardiomegaly 429.3 / I51.7  History:        Patient has prior history of Echocardiogram examinations, most                 recent 12/15/2015. Previous Myocardial Infarction and CAD, COPD;                 Risk Factors:Dyslipidemia and Diabetes.  Sonographer:    Bernadene Person RDCS Referring Phys: [id number] [place]  1. Left ventricular ejection fraction, by estimation, is 60 to 65%. The left ventricle has normal function. The left ventricle has no regional wall motion abnormalities. Left ventricular diastolic parameters are consistent with Grade I diastolic dysfunction (impaired relaxation).  2. Right ventricular systolic function is normal. The right ventricular size is normal. There is mildly elevated pulmonary artery systolic pressure. The estimated right ventricular systolic pressure is 40.3 mmHg.  3. Left atrial size was moderately dilated.  4. The mitral valve is abnormal. Mild mitral valve regurgitation.  5. The aortic valve is tricuspid. Aortic valve regurgitation is not visualized.  6. Aortic dilatation noted. There is mild dilatation of the aortic root, measuring 39 mm.  7. The inferior vena cava is dilated in size with <50% respiratory variability, suggesting right atrial pressure of 15 mmHg. Comparison(s): Changes from prior study are noted. 12/15/2015: LVEF 55-60%, normal LA size, grade 2 DD, dilated aortic root to 3.9 cm, RVSP 33 mmHg. FINDINGS  Left Ventricle: Left ventricular ejection fraction, by  estimation, is 60 to 65%. The left ventricle has normal function. The left ventricle has no regional wall motion abnormalities. The left ventricular internal cavity size was normal in size. There is  borderline left ventricular hypertrophy. Left ventricular diastolic parameters are consistent with Grade I diastolic dysfunction (impaired relaxation). Indeterminate filling pressures. Right Ventricle: The right ventricular size is normal. No increase in right ventricular wall thickness. Right ventricular systolic function is normal. There is mildly elevated pulmonary artery systolic pressure. The tricuspid regurgitant velocity is 2.62  m/s, and with an assumed right atrial pressure of 15 mmHg, the estimated right ventricular systolic pressure is 47.4 mmHg. Left Atrium: Left atrial size was moderately dilated. Right Atrium: Right atrial size was normal in size. Pericardium: There is  no evidence of pericardial effusion. Mitral Valve: The mitral valve is abnormal. There is mild thickening of the mitral valve leaflet(s). Mild mitral valve regurgitation. Tricuspid Valve: The tricuspid valve is grossly normal. Tricuspid valve regurgitation is trivial. Aortic Valve: The aortic valve is tricuspid. Aortic valve regurgitation is not visualized. Pulmonic Valve: The pulmonic valve was normal in structure. Pulmonic valve regurgitation is not visualized. Aorta: Aortic dilatation noted. There is mild dilatation of the aortic root, measuring 39 mm. Venous: The inferior vena cava is dilated in size with less than 50% respiratory variability, suggesting right atrial pressure of 15 mmHg. IAS/Shunts: No atrial level shunt detected by color flow Doppler.  LEFT VENTRICLE PLAX 2D LVIDd:         4.90 cm  Diastology LVIDs:         3.10 cm  LV e' medial:    8.49 cm/s LV PW:         1.00 cm  LV E/e' medial:  12.7 LV IVS:        0.90 cm  LV e' lateral:   7.72 cm/s LVOT diam:     2.20 cm  LV E/e' lateral: 14.0 LV SV:         87 LV SV Index:   43  LVOT Area:     3.80 cm  RIGHT VENTRICLE RV S prime:     16.30 cm/s TAPSE (M-mode): 2.3 cm LEFT ATRIUM             Index       RIGHT ATRIUM           Index LA diam:        4.40 cm 2.19 cm/m  RA Area:     17.30 cm LA Vol (A2C):   77.3 ml 38.40 ml/m RA Volume:   44.50 ml  22.10 ml/m LA Vol (A4C):   90.1 ml 44.75 ml/m LA Biplane Vol: 91.5 ml 45.45 ml/m  AORTIC VALVE LVOT Vmax:   115.93 cm/s LVOT Vmean:  75.433 cm/s LVOT VTI:    0.229 m  AORTA Ao Root diam: 3.90 cm Ao Asc diam:  3.60 cm MITRAL VALVE                TRICUSPID VALVE MV Area (PHT): 4.21 cm     TR Peak grad:   27.5 mmHg MV Decel Time: 180 msec     TR Vmax:        262.00 cm/s MV E velocity: 108.00 cm/s MV A velocity: 85.90 cm/s   SHUNTS MV E/A ratio:  1.26         Systemic VTI:  0.23 m                             Systemic Diam: 2.20 cm Lyman Bishop MD Electronically signed by Lyman Bishop MD Signature Date/Time: 12/17/2019/3:23:40 PM    Final         Scheduled Meds: . Chlorhexidine Gluconate Cloth  6 each Topical Q0600  . fluticasone furoate-vilanterol  1 puff Inhalation Daily  . midodrine  5 mg Oral TID WC  . montelukast  10 mg Oral Daily  . pregabalin  200 mg Oral TID  . sodium chloride flush  10-40 mL Intracatheter Q12H  . tamsulosin  0.4 mg Oral Daily   Continuous Infusions: . ceFEPime (MAXIPIME) IV 2 g (12/19/19 0550)     LOS: 4 days    Time spent:35 mins. More than  50% of that time was spent in counseling and/or coordination of care.      Shelly Coss, MD [facility] P11/05/2019, 8:05 AM

## 2019-12-19 NOTE — Evaluation (Signed)
Physical Therapy Evaluation Patient Details Name: Ryan Copeland MRN: [id number] DOB: 1950-11-16 Today's Date: 12/19/2019   History of Present Illness  Pt is a 69 y/o male admitted secondary to sepsis from UTI. Pt with recent admission secondary to sepsis and PNA. PMH includes CLL, DM, gout, COPD, and COVID.   Clinical Impression  Pt admitted secondary to problem above with deficits below. Pt requiring min guard A for mobility tasks this session using RW. Mild instability noted, however, no overt LOB. Educated about using RW at home. Pt reports he doesn't feel like he will need HHPT when he gets home. Reports wife is available to assist as needed. Will continue to follow acutely.     Follow Up Recommendations No PT follow up (pt refusing HHPT )    Equipment Recommendations  None recommended by PT    Recommendations for Other Services       Precautions / Restrictions Precautions Precautions: Fall Restrictions Weight Bearing Restrictions: No      Mobility  Bed Mobility Overal bed mobility: Modified Independent                  Transfers Overall transfer level: Needs assistance Equipment used: Rolling walker (2 wheeled) Transfers: Sit to/from Stand Sit to Stand: Min guard         General transfer comment: Min guard for safety. Cues for safe hand placement.   Ambulation/Gait Ambulation/Gait assistance: Min guard Gait Distance (Feet): 75 Feet Assistive device: Rolling walker (2 wheeled) Gait Pattern/deviations: Step-through pattern;Decreased stride length Gait velocity: Decreased   General Gait Details: Mild unsteadiness noted, however, no overt LOB noted. Educated about using RW at home to increase safety.   Stairs            Wheelchair Mobility    Modified Rankin (Stroke Patients Only)       Balance Overall balance assessment: Needs assistance Sitting-balance support: No upper extremity supported;Feet supported Sitting balance-Leahy Scale: Good      Standing balance support: Bilateral upper extremity supported;During functional activity Standing balance-Leahy Scale: Fair Standing balance comment: Reliant on BUE support                              Pertinent Vitals/Pain Pain Assessment: No/denies pain    Home Living Family/patient expects to be discharged to:: Private residence Living Arrangements: Spouse/significant other Available Help at Discharge: Family;Available 24 hours/day Type of Home: House Home Access: Stairs to enter Entrance Stairs-Rails: Right Entrance Stairs-Number of Steps: 3-4 Home Layout: Two level;Laundry or work area in [place]: Environmental consultant - 2 wheels      Prior Function Level of Independence: Independent               Hand Dominance        Extremity/Trunk Assessment   Upper Extremity Assessment Upper Extremity Assessment: Defer to OT evaluation    Lower Extremity Assessment Lower Extremity Assessment: Generalized weakness    Cervical / Trunk Assessment Cervical / Trunk Assessment: Kyphotic  Communication   Communication: No difficulties  Cognition Arousal/Alertness: Awake/alert Behavior During Therapy: WFL for tasks assessed/performed Overall Cognitive Status: No family/caregiver present to determine baseline cognitive functioning                                 General Comments: Reports he was here for PNA, but that was what he came in for last  time.       General Comments General comments (skin integrity, edema, etc.): No family present     Exercises     Assessment/Plan    PT Assessment Patient needs continued PT services  PT Problem List Decreased activity tolerance;Decreased balance;Decreased mobility;Decreased knowledge of use of DME;Decreased safety awareness;Decreased knowledge of precautions;Decreased strength       PT Treatment Interventions Gait training;DME instruction;Functional mobility training;Therapeutic  activities;Therapeutic exercise;Stair training;Balance training;Patient/family education    PT Goals (Current goals can be found in the Care Plan section)  Acute Rehab PT Goals Patient Stated Goal: to go home PT Goal Formulation: With patient Time For Goal Achievement: 01/02/20 Potential to Achieve Goals: Good    Frequency Min 3X/week   Barriers to discharge        Co-evaluation               AM-PAC PT "6 Clicks" Mobility  Outcome Measure Help needed turning from your back to your side while in a flat bed without using bedrails?: None Help needed moving from lying on your back to sitting on the side of a flat bed without using bedrails?: None Help needed moving to and from a bed to a chair (including a wheelchair)?: A Little Help needed standing up from a chair using your arms (e.g., wheelchair or bedside chair)?: A Little Help needed to walk in hospital room?: A Little Help needed climbing 3-5 steps with a railing? : A Little 6 Click Score: 20    End of Session Equipment Utilized During Treatment: Gait belt Activity Tolerance: Patient tolerated treatment well Patient left: in bed;with call bell/phone within reach Nurse Communication: Mobility status PT Visit Diagnosis: Unsteadiness on feet (R26.81);Muscle weakness (generalized) (M62.81)    Time: 0737-1062 PT Time Calculation (min) (ACUTE ONLY): 28 min   Charges:   PT Evaluation $PT Eval Moderate Complexity: 1 Mod PT Treatments $Gait Training: 8-22 mins        Lou Miner, DPT  Acute Rehabilitation Services  Pager: [phone] Office: [phone]   Rudean Hitt 12/19/2019, 4:52 PM

## 2019-12-19 NOTE — Plan of Care (Signed)
A/Ox4, LCTA but diminished throughout. No SOB reported. X1 dose PRN pain medication so far this shift. Up with SBA. Wife @ bedside    Problem: Clinical Measurements: Goal: Ability to maintain clinical measurements within normal limits will improve Outcome: Progressing Goal: Will remain free from infection Outcome: Progressing Goal: Diagnostic test results will improve Outcome: Progressing Goal: Respiratory complications will improve Outcome: Progressing Goal: Cardiovascular complication will be avoided Outcome: Progressing   Problem: Activity: Goal: Risk for activity intolerance will decrease Outcome: Progressing   Problem: Nutrition: Goal: Adequate nutrition will be maintained Outcome: Progressing   Problem: Coping: Goal: Level of anxiety will decrease Outcome: Progressing   Problem: Elimination: Goal: Will not experience complications related to bowel motility Outcome: Progressing Goal: Will not experience complications related to urinary retention Outcome: Progressing   Problem: Pain Managment: Goal: General experience of comfort will improve Outcome: Progressing   Problem: Safety: Goal: Ability to remain free from injury will improve Outcome: Progressing   Problem: Skin Integrity: Goal: Risk for impaired skin integrity will decrease Outcome: Progressing

## 2019-12-20 ENCOUNTER — Inpatient Hospital Stay: Payer: Medicare Other

## 2019-12-20 ENCOUNTER — Inpatient Hospital Stay: Payer: Medicare Other | Admitting: Hematology and Oncology

## 2019-12-20 LAB — CBC
HCT: 30.1 % — ABNORMAL LOW (ref 39.0–52.0)
Hemoglobin: 9.5 g/dL — ABNORMAL LOW (ref 13.0–17.0)
MCH: 32.8 pg (ref 26.0–34.0)
MCHC: 31.6 g/dL (ref 30.0–36.0)
MCV: 103.8 fL — ABNORMAL HIGH (ref 80.0–100.0)
Platelets: 69 10*3/uL — ABNORMAL LOW (ref 150–400)
RBC: 2.9 MIL/uL — ABNORMAL LOW (ref 4.22–5.81)
RDW: 16.2 % — ABNORMAL HIGH (ref 11.5–15.5)
WBC: 3.5 10*3/uL — ABNORMAL LOW (ref 4.0–10.5)
nRBC: 0 % (ref 0.0–0.2)

## 2019-12-20 LAB — BASIC METABOLIC PANEL
Anion gap: 6 (ref 5–15)
BUN: 9 mg/dL (ref 8–23)
CO2: 25 mmol/L (ref 22–32)
Calcium: 8 mg/dL — ABNORMAL LOW (ref 8.9–10.3)
Chloride: 110 mmol/L (ref 98–111)
Creatinine, Ser: 0.69 mg/dL (ref 0.61–1.24)
GFR, Estimated: >60 mL/min (ref >60)
Glucose, Bld: 117 mg/dL — ABNORMAL HIGH (ref 70–99)
Potassium: 3.9 mmol/L (ref 3.5–5.1)
Sodium: 141 mmol/L (ref 135–145)

## 2019-12-20 LAB — CULTURE, BLOOD (ROUTINE X 2)
Culture: NO GROWTH
Culture: NO GROWTH
Special Requests: ADEQUATE

## 2019-12-20 LAB — MAGNESIUM: Magnesium: 2 mg/dL (ref 1.7–2.4)

## 2019-12-20 MED ORDER — HEPARIN SOD (PORK) LOCK FLUSH 100 UNIT/ML IV SOLN
500.0000 [IU] | INTRAVENOUS | Status: AC | PRN
  Administered 2019-12-20: 500 [IU]
  Filled 2019-12-20: qty 5

## 2019-12-20 MED ORDER — MIDODRINE HCL 5 MG PO TABS
5.0000 mg | ORAL_TABLET | Freq: Three times a day (TID) | ORAL | 1 refills | Status: DC
Start: 2019-12-20 — End: 2020-01-15

## 2019-12-20 NOTE — Discharge Summary (Signed)
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  On presentation he was hypotensive.  Also reported low-grade fever at home.  On his last hospitalization, his hospital course was complicated by urinary retention and he was discharged on Foley catheter.  He was seen by urology on 10/28 for voiding trials but were unsuccessful and Foley was replaced.  On presentation, patient was in septic shock patient.  Started on IV antibiotics.  Patient was admitted under ICU service.  Patient transferred to [facility] service on 12/19/2019.  His urine cultures showed resistant E. coli but blood cultures remain negative.  Currently he is hemodynamically stable, blood pressure has improved.  He is medically stable for discharge home today.  Following problems were addressed during his hospitalization:  Severe sepsis/septic shock: Presented with low-grade fever, hypotension, altered mental status.  Source is UTI.  Patient has indwelling Foley catheter for urinary retention.  Urine culture showed resistant Pseudomonas.  Treated with 5 days course of cefepime.  Blood cultures have remained negative so far.  Hypotension: Blood pressure still soft.  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  Denies any dizziness or lightheadedness.  No history of recent falls.  Started on midodrine with improvement.  Pancytopenia: Currently has leukopenia, anemia, thrombocytopenia.  This could be associated with his history of CLL.  Continue monitor CBC.   Follow-up with oncology as an outpatient  CLL: On acalabrutinib.  Follows with oncology.    Urine retention: Was discharged with Foley catheter on last hospitalization.  Follows with urology.  History of COPD: Continue bronchodilators as needed.  Continue supplemental oxygen if needed.  He was treated for pneumonia on his last admission.  Follow-up chest x-ray showed improved aeration on the left lung airspace disease.  Diabetes mellitus: Continue home meds  Hypokalemia/hypomagnesemia: Supplemented with potassium and magnesium.  Debility/deconditioning: We requested PT/OT evaluation.No follow up recommended.  Discharge Diagnoses:  Active Problems:   Septic shock [facility])    Discharge Instructions  Discharge Instructions    Diet - low sodium heart healthy   Complete by: As directed    Discharge instructions   Complete by: As directed    1)Please follow up with your PCP in a week.  Do a CBC test during the follow-up. 2)Take prescribed medication as instructed. 3)Follow up with your urologist as an outpatient.  Follow-up with your hematologist.   Increase activity slowly   Complete by: As directed      Allergies as of 12/20/2019      Reactions   Privigen [immune Globulin] Hives   Pt reports hives/rash on body with IV Privigen. He was changed to IV Gamunex-C and had many infusions without adverse side effects.   Codeine Hives   Pt states he can take a few, more reaction with extended doses.      Medication List    STOP taking these medications   amoxicillin-clavulanate 875-125 MG tablet Commonly known as: AUGMENTIN     TAKE these medications   acyclovir 400 MG tablet Commonly known as: ZOVIRAX TAKE 1 TABLET BY MOUTH TWICE A DAY   albuterol 108 (90  Base) MCG/ACT inhaler Commonly known as: Proventil HFA TAKE 2 PUFFS BY MOUTH EVERY 6 HOURS AS NEEDED FOR WHEEZE OR SHORTNESS OF BREATH What changed:   how much to take  how to take this  when to take  this  reasons to take this   allopurinol 300 MG tablet Commonly known as: ZYLOPRIM TAKE 1 TABLET BY MOUTH EVERY DAY   ALPRAZolam 0.25 MG tablet Commonly known as: XANAX Take 0.5 mg by mouth as needed for anxiety.   b complex vitamins tablet Take 1 tablet by mouth daily.   benzonatate 100 MG capsule Commonly known as: TESSALON Take 2 capsules (200 mg total) by mouth 3 (three) times daily as needed for cough.   Breo Ellipta 200-25 MCG/INH Aepb Generic drug: fluticasone furoate-vilanterol Inhale 1 puff into the lungs daily.   Calquence 100 MG capsule Generic drug: acalabrutinib TAKE 100 MG BY MOUTH 2 (TWO) TIMES DAILY. What changed: See the new instructions.   famotidine 20 MG tablet Commonly known as: PEPCID TAKE 1 TABLET BY MOUTH TWICE A DAY What changed:   when to take this  reasons to take this   furosemide 20 MG tablet Commonly known as: LASIX TAKE 1 TABLET BY MOUTH EVERY DAY AS NEEDED What changed: reasons to take this   HYDROcodone-acetaminophen 10-325 MG tablet Commonly known as: NORCO Take 1 tablet by mouth See admin instructions. Every 6 to 8 hours as needed for pain   HYDROcodone-homatropine 5-1.5 MG/5ML syrup Commonly known as: HYCODAN Take 5 mLs by mouth every 6 (six) hours as needed for cough.   lidocaine-prilocaine cream Commonly known as: EMLA APPLY TO AFFECTED AREA ONCE What changed: See the new instructions.   loperamide 2 MG capsule Commonly known as: IMODIUM Take 2 capsules (4 mg total) by mouth every 6 (six) hours as needed for diarrhea or loose stools.   meclizine 12.5 MG tablet Commonly known as: ANTIVERT Take 12.5 mg by mouth every 6 (six) hours as needed for dizziness.   metFORMIN 500 MG 24 hr tablet Commonly known as: GLUCOPHAGE-XR Take 500-1,000 mg by mouth See admin instructions. Takes one tablet (500mg )  in AM and 2 tablets (1000mg ) at night.   midodrine 5 MG tablet Commonly known as: PROAMATINE Take 1 tablet (5 mg total)  by mouth 3 (three) times daily with meals.   mometasone 50 MCG/ACT nasal spray Commonly known as: NASONEX Place 2 sprays into the nose daily.   montelukast 10 MG tablet Commonly known as: SINGULAIR TAKE 1 TABLET BY MOUTH EVERY DAY   nitroGLYCERIN 0.4 MG SL tablet Commonly known as: NITROSTAT PLACE 1 TABLET UNDER THE TONGUE EVERY 5 MINUTES X 3 DOSES AS NEEDED FOR CHEST PAIN *MAX 3 DOSES* What changed:   how much to take  how to take this  when to take this   predniSONE 20 MG tablet Commonly known as: DELTASONE Take 20 mg by mouth See admin instructions. Take 1 tablet (20mg ) by mouth three times daily for 3 days, then take 1 tablet (20mg ) by mouth twice daily for 3 days, then take 1 tablet by mouth daily for 3 days.   pregabalin 200 MG capsule Commonly known as: LYRICA TAKE 1 CAPSULE (200 MG TOTAL) BY MOUTH 3 (THREE) TIMES DAILY.   rosuvastatin 5 MG tablet Commonly known as: CRESTOR TAKE 1 TABLET BY MOUTH EVERY DAY   tamsulosin 0.4 MG Caps capsule Commonly known as: FLOMAX Take 1 capsule (0.4 mg total) by mouth daily.   Vitamin D-1000 Max [address]  25 MCG (1000 UT) tablet Generic drug: Cholecalciferol Take 1,000 Units by mouth daily.   zolpidem 10 MG tablet Commonly known as: AMBIEN Take 10 mg by mouth at bedtime as needed for sleep.       Follow-up Information    [name]., PA-C. Schedule an appointment as soon as possible for a visit in 1 week(s).   Specialty: Family Medicine Contact information: [address] [place]  [address] [phone]              Allergies  Allergen Reactions  . Privigen [Immune Globulin] Hives    Pt reports hives/rash on body with IV Privigen. He was changed to IV Gamunex-C and had many infusions without adverse side effects.  . Codeine Hives    Pt states he can take a few, more reaction with extended doses.    Consultations:  PCCM   Procedures/Studies: CT Head Wo Contrast  Result Date:  12/15/2019 CLINICAL DATA:  Mental status change. EXAM: CT HEAD WITHOUT CONTRAST TECHNIQUE: Contiguous axial images were obtained from the base of the skull through the vertex without intravenous contrast. COMPARISON:  11/29/2019 FINDINGS: Brain: No evidence of acute infarction, hemorrhage, hydrocephalus, extra-axial collection or mass lesion/mass effect. Mild areas of predominantly subcortical white matter hypoattenuation are noted consistent with chronic microvascular ischemic change. Vascular: No hyperdense vessel or unexpected calcification. Skull: Normal. Negative for fracture or focal lesion. Sinuses/Orbits: Significant sinus disease. Right maxillary sinus is mostly occluded due to mucosal thickening. Moderate mucosal thickening with dependent secretions in the left maxillary sinus. Mucosal thickening occludes and partly occludes multiple ethmoid air cells. Mild mucosal thickening lies in the inferior frontal sinuses. Other: None. IMPRESSION: 1. No acute intracranial abnormalities. 2. Mild chronic microvascular ischemic change. 3. Sinus mucosal thickening as detailed above, unchanged compared to the recent prior head CT. Electronically Signed   By: Lajean Manes M.D.   On: 12/15/2019 10:24   CT Head Wo Contrast  Result Date: 11/29/2019 CLINICAL DATA:  69 year old male with altered mental status. EXAM: CT HEAD WITHOUT CONTRAST TECHNIQUE: Contiguous axial images were obtained from the base of the skull through the vertex without intravenous contrast. COMPARISON:  None. FINDINGS: Brain: The ventricles and sulci appropriate size for patient's age. Minimal periventricular and deep white matter chronic microvascular ischemic changes noted. There is no acute intracranial hemorrhage. No mass effect midline shift. No extra-axial fluid collection. Vascular: No hyperdense vessel or unexpected calcification. Skull: Normal. Negative for fracture or focal lesion. Sinuses/Orbits: There is diffuse mucoperiosteal  thickening of paranasal sinuses with near complete opacification of the maxillary sinuses and ethmoid air cells. No air-fluid level. The mastoid air cells are clear. Other: None IMPRESSION: 1. No acute intracranial pathology. 2. Paranasal sinus disease. Electronically Signed   By: Anner Crete M.D.   On: 11/29/2019 19:41   CT Angio Chest PE W and/or Wo Contrast  Result Date: 11/29/2019 CLINICAL DATA:  Shortness of breath. Clinical concern for pulmonary embolism. EXAM: CT ANGIOGRAPHY CHEST WITH CONTRAST TECHNIQUE: Multidetector CT imaging of the chest was performed using the standard protocol during bolus administration of intravenous contrast. Multiplanar CT image reconstructions and MIPs were obtained to evaluate the vascular anatomy. CONTRAST:  71mL OMNIPAQUE IOHEXOL 350 MG/ML SOLN COMPARISON:  11/29/2018. FINDINGS: Cardiovascular: There is satisfactory opacification of the pulmonary arteries to the segmental level. There is no evidence of a pulmonary embolism. Heart is top-normal in size. There are three-vessel coronary artery calcifications. No pericardial effusion. Great vessels are normal caliber. There is aortic  atherosclerosis no significant stenosis. No dissection Mediastinum/Nodes: No neck base, axillary, mediastinal or hilar masses or enlarged lymph nodes. Trachea and esophagus are unremarkable. Lungs/Pleura: Multiple areas of lung consolidation. Many of these are nodular in configuration. There are ill-defined masslike areas of opacity in the left upper lobe, lateral to the left hilum, several of which shows central air density consistent cavitation. There is also dependent ground-glass type opacity which may reflect atelectasis infection or a combination. These findings are all new since the prior chest CT. The largest masslike opacity in the left upper lobe measures 3 cm in greatest transverse dimension and is centered on image 74, series 6. Larger stool nodular opacity on the right is in the  lower lobe, image 91, series 6, 1.2 cm in size. Small left and minimal right pleural effusions.  No pneumothorax. Upper Abdomen: No acute findings. Partly imaged spleen is enlarged, 12.5 cm in greatest transverse dimension, unchanged from the prior CT. Musculoskeletal: No fracture or acute finding. No osteoblastic or osteolytic lesions. Review of the MIP images confirms the above findings. IMPRESSION: 1. No evidence of a pulmonary embolism. 2. Bilateral areas of lung consolidation consistent with multifocal pneumonia. Many of these areas are nodular to mass-like in configuration, but still suspected to be infectious in etiology. Recommend follow-up chest CT after completion of therapy and symptom improvement to document improvement/resolution on imaging. There are associated small left and minimal right pleural effusions. Aortic Atherosclerosis (ICD10-I70.0). Electronically Signed   By: Lajean Manes M.D.   On: 11/29/2019 19:46   US RENAL  Result Date: 12/01/2019 CLINICAL DATA:  Urinary retention EXAM: RENAL / URINARY TRACT ULTRASOUND COMPLETE COMPARISON:  CT abdomen/pelvis dated 11/29/2018 FINDINGS: Right Kidney: Renal measurements: 10.4 x 5.3 x 4.9 cm = volume: 142.5 mL. Echogenicity within normal limits. No mass or hydronephrosis visualized. Left Kidney: Renal measurements: 11.9 x 4.7 x 5.6 cm = volume: 163.3 mL. Echogenicity within normal limits. No mass or hydronephrosis visualized. Bladder: Appears normal for degree of bladder distention. Other: Splenomegaly, better evaluated on prior CT. Prostate indents the base of the bladder, suggesting BPH. IMPRESSION: Suspected BPH. This may account for the patient's urinary retention, on the basis of chronic bladder outlet obstruction. No hydronephrosis. Splenomegaly, previously noted on CT. Electronically Signed   By: Julian Hy M.D.   On: 12/01/2019 11:52   DG CHEST PORT 1 VIEW  Result Date: 12/18/2019 CLINICAL DATA:  Pneumonia EXAM: PORTABLE CHEST 1  VIEW COMPARISON:  12/15/2019 FINDINGS: Stable borderline heart size. Airspace opacity in the left more than right. Possible trace pleural fluid and new cephalized blood flow. No pneumothorax. Porta catheter on the right with tip at the upper cavoatrial junction. IMPRESSION: Unchanged pulmonary infiltrates. Possible interval vascular congestion. Electronically Signed   By: Monte Fantasia M.D.   On: 12/18/2019 07:00   DG Chest Port 1 View  Result Date: 12/15/2019 CLINICAL DATA:  Possible sepsis. EXAM: PORTABLE CHEST 1 VIEW COMPARISON:  11/28/2019 and earlier studies. FINDINGS: Patchy airspace consolidation is noted in the left mid and both lower lungs. The consolidation on the left is less dense than it was on the most recent prior exam consistent with mild improvement. No change on the right. No new lung abnormalities. No convincing pleural effusion.  No pneumothorax. Stable right anterior chest wall Port-A-Cath, tip in the lower superior vena cava. IMPRESSION: 1. Mild interval improvement with decreased density of the consolidation in the left mid to lower lung. 2. No other change. Electronically Signed   By:  Lajean Manes M.D.   On: 12/15/2019 07:10   DG Chest Port 1 View  Result Date: 11/28/2019 CLINICAL DATA:  Questionable sepsis. Patient lethargic and ill for 4 weeks. EXAM: PORTABLE CHEST 1 VIEW COMPARISON:  None. FINDINGS: Extensive peripheral predominant airspace opacities in the left midlung and lung base. No visible pleural effusions or pneumothorax. Mild enlargement the cardiac silhouette. Coronary artery stent. Aortic atherosclerosis. Right approach Port-A-Cath with the tip projecting at the inferior SVC. No acute osseous abnormality. IMPRESSION: 1. Extensive peripheral predominant airspace opacities in the left midlung and lung base, compatible with pneumonia. While nonspecific, consider COVID. 2. Mild cardiomegaly. Electronically Signed   By: Margaretha Sheffield MD   On: 11/28/2019 12:27   NCV  with EMG(electromyography)  Result Date: 11/25/2019 Marcial Pacas, MD     11/25/2019  9:38 AM     Full Name: Eddie DibblesRichard" Georgina Peer Gender: Male MRN #: [id number] Date of Birth: [birth year]   Visit Date: 11/25/2019 07:27 Age: 71 Years Examining Physician: Marcial Pacas, MD Referring Physician: Marcial Pacas, MD Height: 6 feet 0 inch History: 69 year old male with history of progressive bilateral feet paresthesia, gait abnormality, Summary of the test: Nerve conduction study: Right sural, superficial peroneal, bilateral ulnar sensory response was absent.  Bilateral median and radial sensory responses showed significantly prolonged peak latency, with mildly decreased snap amplitude. Right peroneal to EDB, tibial motor responses were absent.  Bilateral ulnar motor responses showed mildly prolonged distal latency, with moderately decreased CMAP amplitude.  Bilateral median motor responses showed significantly prolonged distal latency, significantly decreased CMAP amplitude, and conduction velocity. All the motor F wave latencies were significantly prolonged, there is also evidence of temporal dispersion right median, left median, and left ulnar motor response. Electromyography: Selected needle examinations of right upper, lower extremity muscles, right cervical and lumbosacral paraspinal muscles were performed. There was evidence of chronic neuropathic changes involving right upper and lower extremity muscles.  There is evidence of increased activity at right cervical and lumbosacral paraspinal muscles. Conclusion: This is an abnormal study.  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 -------------------------------  Marcial Pacas, MD PhD Physician Name [facility] [place], [place] [address] Tel: [phone] Fax: [phone] Verbal informed consent was obtained from the patient, patient was informed of potential risk of procedure, including bruising, bleeding,  hematoma formation, infection, muscle weakness, muscle pain, numbness, among others.     [place]   Nerve / Sites Muscle Latency Ref. Amplitude Ref. Rel Amp Segments Distance Velocity Ref. Area   ms ms mV mV %  cm m/s m/s mVms L Median - APB    Wrist APB 6.3 ?4.4 3.4 ?4.0 100 Wrist - APB 7   18.7    Upper arm APB 13.0  2.5  73.1 Upper arm - Wrist 21 31 ?49 18.3 R Median - APB    Wrist APB 8.5 ?4.4 1.3 ?4.0 100 Wrist - APB 7   11.2    Upper arm APB 15.5  1.0  73.2 Upper arm - Wrist 24 34 ?49 9.7 L Ulnar - ADM    Wrist ADM 4.4 ?3.3 7.8 ?6.0 100 Wrist - ADM 7   29.7    B.Elbow ADM 10.4  5.1  65 B.Elbow - Wrist 22 36 ?49 27.6    A.Elbow ADM 13.4  4.6  89.7 A.Elbow - B.Elbow 10 34 ?49 25.4        A.Elbow - Wrist     R Ulnar - ADM    Wrist  ADM 4.8 ?3.3 8.4 ?6.0 100 Wrist - ADM 9   26.2    B.Elbow ADM 10.0  7.4  87.8 B.Elbow - Wrist 20 39 ?49 24.5        A.Elbow - B.Elbow 10 38 ?49         A.Elbow - Wrist     R Peroneal - EDB    Ankle EDB NR ?6.5 NR ?2.0 NR Ankle - EDB 9   NR    Fib head EDB NR  NR  NR Fib head - Ankle 30 NR ?44 NR R Tibial - AH    Ankle AH NR ?5.8 NR ?4.0 NR Ankle - AH 9 NR  NR               SNC   Nerve / Sites Rec. Site Peak Lat Ref.  Amp Ref. Segments Distance   ms ms V V  cm L Radial - Anatomical snuff box (Forearm)    Forearm Wrist 3.9 ?2.9 10 ?15 Forearm - Wrist 10 R Radial - Anatomical snuff box (Forearm)    Forearm Wrist 3.7 ?2.9 10 ?15 Forearm - Wrist 10 R Sural - Ankle (Calf)    Calf Ankle NR ?4.4 NR ?6 Calf - Ankle 14 R Superficial peroneal - Ankle    Lat leg Ankle NR ?4.4 NR ?6 Lat leg - Ankle 14 L Median - Orthodromic (Dig II, Mid palm)    Dig II Wrist 5.0 ?3.4 4 ?10 Dig II - Wrist 13 R Median - Orthodromic (Dig II, Mid palm)    Dig II Wrist 4.9 ?3.4 5 ?10 Dig II - Wrist 16 L Ulnar - Orthodromic, (Dig V, Mid palm)    Dig V Wrist NR ?3.1 NR ?5 Dig V - Wrist 11 R Ulnar - Orthodromic, (Dig V, Mid palm)    Dig V Wrist NR ?3.1 NR ?5 Dig V - Wrist 72                   F  Wave   Nerve F Lat Ref.  ms ms  L Median - APB 43.2 ?31.0 L Ulnar - ADM 40.8 ?32.0 R Median - APB 47.5 ?31.0 R Ulnar - ADM 42.0 ?32.0           EMG Summary Table   Spontaneous MUAP Recruitment Muscle IA Fib PSW Fasc Other Amp Dur. Poly Pattern R. Tibialis anterior Increased None None None _______ Increased Increased 1+ Reduced R. Tibialis posterior Increased 1+ None None _______ Increased Increased 1+ Reduced R. Peroneus longus Increased 1+ None None _______ Increased Increased 1+ Reduced R. Gastrocnemius (Medial head) Increased 1+ None None _______ Increased Increased 1+ Reduced R. Vastus lateralis Increased None None None _______ Increased Increased 1+ Reduced R. Lumbar paraspinals (low) Increased None None None _______ Normal Normal Normal Normal R. Lumbar paraspinals (mid) Increased None None None _______ Normal Normal Normal Normal R. First dorsal interosseous Increased 1+ None None _______ Increased Increased 1+ Reduced R. Pronator teres Increased 1+ None None _______ Increased Increased 1+ Normal R. Biceps brachii Increased None None None _______ Increased Increased 1+ Normal R. Deltoid Increased None None None _______ Increased Increased Normal Normal R. Triceps brachii Increased None None None _______ Increased Increased Normal Reduced R. Cervical paraspinals Normal None None None _______ Normal Normal Normal Normal    ECHOCARDIOGRAM COMPLETE  Result Date: 12/17/2019    ECHOCARDIOGRAM REPORT   Patient Name:   BEVERLEY SHERRARD Date of Exam: 12/17/2019 Medical Rec #:  [id number]     Height:       72.0 in Accession #:    [id number]    Weight:       175.0 lb Date of Birth:  1951-01-31     BSA:          2.013 m Patient Age:    8 years      BP:           114/62 mmHg Patient Gender: M             HR:           73 bpm. Exam Location:  Inpatient Procedure: 2D Echo, Cardiac Doppler and Color Doppler Indications:    Cardiomegaly 429.3 / I51.7  History:        Patient has prior history of Echocardiogram examinations, most                 recent  12/15/2015. Previous Myocardial Infarction and CAD, COPD;                 Risk Factors:Dyslipidemia and Diabetes.  Sonographer:    Bernadene Person RDCS Referring Phys: [id number] [place]  1. Left ventricular ejection fraction, by estimation, is 60 to 65%. The left ventricle has normal function. The left ventricle has no regional wall motion abnormalities. Left ventricular diastolic parameters are consistent with Grade I diastolic dysfunction (impaired relaxation).  2. Right ventricular systolic function is normal. The right ventricular size is normal. There is mildly elevated pulmonary artery systolic pressure. The estimated right ventricular systolic pressure is 16.0 mmHg.  3. Left atrial size was moderately dilated.  4. The mitral valve is abnormal. Mild mitral valve regurgitation.  5. The aortic valve is tricuspid. Aortic valve regurgitation is not visualized.  6. Aortic dilatation noted. There is mild dilatation of the aortic root, measuring 39 mm.  7. The inferior vena cava is dilated in size with <50% respiratory variability, suggesting right atrial pressure of 15 mmHg. Comparison(s): Changes from prior study are noted. 12/15/2015: LVEF 55-60%, normal LA size, grade 2 DD, dilated aortic root to 3.9 cm, RVSP 33 mmHg. FINDINGS  Left Ventricle: Left ventricular ejection fraction, by estimation, is 60 to 65%. The left ventricle has normal function. The left ventricle has no regional wall motion abnormalities. The left ventricular internal cavity size was normal in size. There is  borderline left ventricular hypertrophy. Left ventricular diastolic parameters are consistent with Grade I diastolic dysfunction (impaired relaxation). Indeterminate filling pressures. Right Ventricle: The right ventricular size is normal. No increase in right ventricular wall thickness. Right ventricular systolic function is normal. There is mildly elevated pulmonary artery systolic pressure. The tricuspid regurgitant  velocity is 2.62  m/s, and with an assumed right atrial pressure of 15 mmHg, the estimated right ventricular systolic pressure is 73.7 mmHg. Left Atrium: Left atrial size was moderately dilated. Right Atrium: Right atrial size was normal in size. Pericardium: There is no evidence of pericardial effusion. Mitral Valve: The mitral valve is abnormal. There is mild thickening of the mitral valve leaflet(s). Mild mitral valve regurgitation. Tricuspid Valve: The tricuspid valve is grossly normal. Tricuspid valve regurgitation is trivial. Aortic Valve: The aortic valve is tricuspid. Aortic valve regurgitation is not visualized. Pulmonic Valve: The pulmonic valve was normal in structure. Pulmonic valve regurgitation is not visualized. Aorta: Aortic dilatation noted. There is mild dilatation of the aortic root, measuring 39 mm. Venous: The inferior vena cava is dilated in size with less than  50% respiratory variability, suggesting right atrial pressure of 15 mmHg. IAS/Shunts: No atrial level shunt detected by color flow Doppler.  LEFT VENTRICLE PLAX 2D LVIDd:         4.90 cm  Diastology LVIDs:         3.10 cm  LV e' medial:    8.49 cm/s LV PW:         1.00 cm  LV E/e' medial:  12.7 LV IVS:        0.90 cm  LV e' lateral:   7.72 cm/s LVOT diam:     2.20 cm  LV E/e' lateral: 14.0 LV SV:         87 LV SV Index:   43 LVOT Area:     3.80 cm  RIGHT VENTRICLE RV S prime:     16.30 cm/s TAPSE (M-mode): 2.3 cm LEFT ATRIUM             Index       RIGHT ATRIUM           Index LA diam:        4.40 cm 2.19 cm/m  RA Area:     17.30 cm LA Vol (A2C):   77.3 ml 38.40 ml/m RA Volume:   44.50 ml  22.10 ml/m LA Vol (A4C):   90.1 ml 44.75 ml/m LA Biplane Vol: 91.5 ml 45.45 ml/m  AORTIC VALVE LVOT Vmax:   115.93 cm/s LVOT Vmean:  75.433 cm/s LVOT VTI:    0.229 m  AORTA Ao Root diam: 3.90 cm Ao Asc diam:  3.60 cm MITRAL VALVE                TRICUSPID VALVE MV Area (PHT): 4.21 cm     TR Peak grad:   27.5 mmHg MV Decel Time: 180 msec     TR  Vmax:        262.00 cm/s MV E velocity: 108.00 cm/s MV A velocity: 85.90 cm/s   SHUNTS MV E/A ratio:  1.26         Systemic VTI:  0.23 m                             Systemic Diam: 2.20 cm Lyman Bishop MD Electronically signed by Lyman Bishop MD Signature Date/Time: 12/17/2019/3:23:40 PM    Final        Subjective: Patient seen and examined at the bedside this morning.  Comfortable.  Stable for discharge.  Discharge Exam: Vitals:   12/20/19 0725 12/20/19 0810  BP:  93/60  Pulse:  78  Resp:  18  Temp:  98.5 F (36.9 C)  SpO2: 97% 100%   Vitals:   12/19/19 1430 12/19/19 2110 12/20/19 0725 12/20/19 0810  BP: 100/65 103/61  93/60  Pulse:  61  78  Resp:  18  18  Temp:  97.6 F (36.4 C)  98.5 F (36.9 C)  TempSrc:  Oral  Oral  SpO2:  100% 97% 100%  Weight:      Height:        General: Pt is alert, awake, not in acute distress Cardiovascular: RRR, S1/S2 +, no rubs, no gallops Respiratory: CTA bilaterally, no wheezing, no rhonchi Abdominal: Soft, NT, ND, bowel sounds + Extremities: no edema, no cyanosis    The results of significant diagnostics from this hospitalization (including imaging, microbiology, ancillary and laboratory) are listed below for reference.     Microbiology: Recent Results (  from the past 240 hour(s))  Blood Culture (routine x 2)     Status: None   Collection Time: 12/15/19  6:37 AM   Specimen: BLOOD  Result Value Ref Range Status   Specimen Description BLOOD LEFT ANTECUBITAL  Final   Special Requests   Final    BOTTLES DRAWN AEROBIC AND ANAEROBIC Blood Culture results may not be optimal due to an inadequate volume of blood received in culture bottles   Culture   Final    NO GROWTH 5 DAYS Performed at [facility], [place] [address] [place], [place] [address]    Report Status 12/20/2019 FINAL  Final  Resp Panel by RT PCR (RSV, Flu A&B, Covid) - Nasopharyngeal Swab     Status: None   Collection Time: 12/15/19  6:37 AM   Specimen:  Nasopharyngeal Swab  Result Value Ref Range Status   SARS Coronavirus 2 by RT PCR NEGATIVE NEGATIVE Final    Comment: (NOTE) SARS-CoV-2 target nucleic acids are NOT DETECTED.  The SARS-CoV-2 RNA is generally detectable in upper respiratoy specimens during the acute phase of infection. The lowest concentration of SARS-CoV-2 viral copies this assay can detect is 131 copies/mL. A negative result does not preclude SARS-Cov-2 infection and should not be used as the sole basis for treatment or other patient management decisions. A negative result may occur with  improper specimen collection/handling, submission of specimen other than nasopharyngeal swab, presence of viral mutation(s) within the areas targeted by this assay, and inadequate number of viral copies (<131 copies/mL). A negative result must be combined with clinical observations, patient history, and epidemiological information. The expected result is Negative.  Fact Sheet for Patients:  PinkCheek.be  Fact Sheet for Healthcare Providers:  GravelBags.it  This test is no t yet approved or cleared by the [place] FDA and  has been authorized for detection and/or diagnosis of SARS-CoV-2 by FDA under an Emergency Use Authorization (EUA). This EUA will remain  in effect (meaning this test can be used) for the duration of the COVID-19 declaration under Section 564(b)(1) of the Act, 21 U.S.C. section 360bbb-3(b)(1), unless the authorization is terminated or revoked sooner.     Influenza A by PCR NEGATIVE NEGATIVE Final   Influenza B by PCR NEGATIVE NEGATIVE Final    Comment: (NOTE) The Xpert Xpress SARS-CoV-2/FLU/RSV assay is intended as an aid in  the diagnosis of influenza from Nasopharyngeal swab specimens and  should not be used as a sole basis for treatment. Nasal washings and  aspirates are unacceptable for Xpert Xpress SARS-CoV-2/FLU/RSV  testing.  Fact Sheet  for Patients: PinkCheek.be  Fact Sheet for Healthcare Providers: GravelBags.it  This test is not yet approved or cleared by the [place] FDA and  has been authorized for detection and/or diagnosis of SARS-CoV-2 by  FDA under an Emergency Use Authorization (EUA). This EUA will remain  in effect (meaning this test can be used) for the duration of the  Covid-19 declaration under Section 564(b)(1) of the Act, 21  U.S.C. section 360bbb-3(b)(1), unless the authorization is  terminated or revoked.    Respiratory Syncytial Virus by PCR NEGATIVE NEGATIVE Final    Comment: (NOTE) Fact Sheet for Patients: PinkCheek.be  Fact Sheet for Healthcare Providers: GravelBags.it  This test is not yet approved or cleared by the [place] FDA and  has been authorized for detection and/or diagnosis of SARS-CoV-2 by  FDA under an Emergency Use Authorization (EUA). This EUA will remain  in effect (meaning this  test can be used) for the duration of the  COVID-19 declaration under Section 564(b)(1) of the Act, 21 U.S.C.  section 360bbb-3(b)(1), unless the authorization is terminated or  revoked. Performed at [facility], [place] [address], [place], [place] [address]   MRSA PCR Screening     Status: None   Collection Time: 12/15/19  6:37 AM   Specimen: Nasal Mucosa; Nasopharyngeal  Result Value Ref Range Status   MRSA by PCR NEGATIVE NEGATIVE Final    Comment:        The GeneXpert MRSA Assay (FDA approved for NASAL specimens only), is one component of a comprehensive MRSA colonization surveillance program. It is not intended to diagnose MRSA infection nor to guide or monitor treatment for MRSA infections. Performed at [facility], [place] [address], [place], [place] [address]   Blood Culture (routine x 2)     Status: None   Collection Time: 12/15/19  7:22 AM    Specimen: BLOOD LEFT FOREARM  Result Value Ref Range Status   Specimen Description BLOOD LEFT FOREARM  Final   Special Requests   Final    BOTTLES DRAWN AEROBIC AND ANAEROBIC Blood Culture adequate volume   Culture   Final    NO GROWTH 5 DAYS Performed at [facility], [place] [address], [place], [place] [address]    Report Status 12/20/2019 FINAL  Final  Urine culture     Status: Abnormal   Collection Time: 12/15/19  7:39 AM   Specimen: In/Out Cath Urine  Result Value Ref Range Status   Specimen Description IN/OUT CATH URINE  Final   Special Requests   Final    NONE Performed at [facility], [place] [address] [place], [place] [address]    Culture 40,000 COLONIES/mL PSEUDOMONAS AERUGINOSA (A)  Final   Report Status 12/17/2019 FINAL  Final   Organism ID, Bacteria PSEUDOMONAS AERUGINOSA (A)  Final      Susceptibility   Pseudomonas aeruginosa - MIC*    CEFTAZIDIME 2 SENSITIVE Sensitive     CIPROFLOXACIN >=4 RESISTANT Resistant     GENTAMICIN <=1 SENSITIVE Sensitive     IMIPENEM <=0.25 SENSITIVE Sensitive     PIP/TAZO <=4 SENSITIVE Sensitive     CEFEPIME Value in next row Sensitive      SENSITIVE8    * 40,000 COLONIES/mL PSEUDOMONAS AERUGINOSA     Labs: BNP (last 3 results) Recent Labs    12/15/19 0647  BNP 601.0*   Basic Metabolic Panel: Recent Labs  Lab 12/16/19 0400 12/17/19 1220 12/18/19 0504 12/19/19 1035 12/20/19 0316  NA 138 142 140 139 141  K 4.4 2.8* 3.4* 3.7 3.9  CL 107 114* 108 107 110  CO2 24 24 26 25 25   GLUCOSE 211* 83 109* 155* 117*  BUN 17 12 11 10 9   CREATININE 0.85 0.53* 0.63 0.68 0.69  CALCIUM 8.1* 6.9* 8.2* 8.0* 8.0*  MG 1.9  --  1.6*  --  2.0   Liver Function Tests: Recent Labs  Lab 12/15/19 0647 12/18/19 0504  AST 19 16  ALT 11 15  ALKPHOS 97 58  BILITOT 0.9 0.4  PROT 4.8* 3.9*  ALBUMIN 2.6* 1.9*   No results for input(s): LIPASE, AMYLASE in the last 168 hours. No results for input(s): AMMONIA in the last 168  hours. CBC: Recent Labs  Lab 12/15/19 0647 12/15/19 1005 12/16/19 0400 12/17/19 1220 12/18/19 0504 12/19/19 1035 12/20/19 0316  WBC 5.5   < > 11.3*  2.0* 1.8* 2.9* 3.5*  NEUTROABS 3.4  --   --   --   --   --   --   HGB 11.9*   < > 10.7* 8.5* 9.3* 10.3* 9.5*  HCT 38.6*   < > 33.7* 26.5* 29.0* 32.1* 30.1*  MCV 106.6*   < > 102.4* 103.9* 101.4* 103.2* 103.8*  PLT 62*   < > 81* 46* 50* 54* 69*   < > = values in this interval not displayed.   Cardiac Enzymes: No results for input(s): CKTOTAL, CKMB, CKMBINDEX, TROPONINI in the last 168 hours. BNP: Invalid input(s): POCBNP CBG: Recent Labs  Lab 12/15/19 0642 12/15/19 1153 12/15/19 1339 12/15/19 2157 12/16/19 0733  GLUCAP 88 83 96 264* 149*   D-Dimer No results for input(s): DDIMER in the last 72 hours. Hgb A1c No results for input(s): HGBA1C in the last 72 hours. Lipid Profile No results for input(s): CHOL, HDL, LDLCALC, TRIG, CHOLHDL, LDLDIRECT in the last 72 hours. Thyroid function studies No results for input(s): TSH, T4TOTAL, T3FREE, THYROIDAB in the last 72 hours.  Invalid input(s): FREET3 Anemia work up No results for input(s): VITAMINB12, FOLATE, FERRITIN, TIBC, IRON, RETICCTPCT in the last 72 hours. Urinalysis    Component Value Date/Time   COLORURINE YELLOW 12/15/2019 0739   APPEARANCEUR CLEAR 12/15/2019 0739   LABSPEC 1.011 12/15/2019 0739   PHURINE 5.0 12/15/2019 0739   GLUCOSEU NEGATIVE 12/15/2019 0739   HGBUR MODERATE (A) 12/15/2019 0739   BILIRUBINUR NEGATIVE 12/15/2019 0739   KETONESUR NEGATIVE 12/15/2019 0739   PROTEINUR NEGATIVE 12/15/2019 0739   UROBILINOGEN 0.2 02/03/2014 1356   NITRITE NEGATIVE 12/15/2019 0739   LEUKOCYTESUR MODERATE (A) 12/15/2019 0739   Sepsis Labs Invalid input(s): PROCALCITONIN,  WBC,  LACTICIDVEN Microbiology Recent Results (from the past 240 hour(s))  Blood Culture (routine x 2)     Status: None   Collection Time: 12/15/19  6:37 AM   Specimen: BLOOD  Result Value  Ref Range Status   Specimen Description BLOOD LEFT ANTECUBITAL  Final   Special Requests   Final    BOTTLES DRAWN AEROBIC AND ANAEROBIC Blood Culture results may not be optimal due to an inadequate volume of blood received in culture bottles   Culture   Final    NO GROWTH 5 DAYS Performed at [facility], [place] [address], [place], [place] [address]    Report Status 12/20/2019 FINAL  Final  Resp Panel by RT PCR (RSV, Flu A&B, Covid) - Nasopharyngeal Swab     Status: None   Collection Time: 12/15/19  6:37 AM   Specimen: Nasopharyngeal Swab  Result Value Ref Range Status   SARS Coronavirus 2 by RT PCR NEGATIVE NEGATIVE Final    Comment: (NOTE) SARS-CoV-2 target nucleic acids are NOT DETECTED.  The SARS-CoV-2 RNA is generally detectable in upper respiratoy specimens during the acute phase of infection. The lowest concentration of SARS-CoV-2 viral copies this assay can detect is 131 copies/mL. A negative result does not preclude SARS-Cov-2 infection and should not be used as the sole basis for treatment or other patient management decisions. A negative result may occur with  improper specimen collection/handling, submission of specimen other than nasopharyngeal swab, presence of viral mutation(s) within the areas targeted by this assay, and inadequate number of viral copies (<131 copies/mL). A negative result must be combined with clinical observations, patient history, and epidemiological information. The expected result is Negative.  Fact Sheet for Patients:  PinkCheek.be  Fact Sheet for Healthcare Providers:  GravelBags.it  This test is no t yet approved or cleared by the [place] and  has been authorized for detection and/or diagnosis of SARS-CoV-2 by FDA under an Emergency Use Authorization (EUA). This EUA will remain  in effect (meaning this test can be used) for the duration of the COVID-19 declaration  under Section 564(b)(1) of the Act, 21 U.S.C. section 360bbb-3(b)(1), unless the authorization is terminated or revoked sooner.     Influenza A by PCR NEGATIVE NEGATIVE Final   Influenza B by PCR NEGATIVE NEGATIVE Final    Comment: (NOTE) The Xpert Xpress SARS-CoV-2/FLU/RSV assay is intended as an aid in  the diagnosis of influenza from Nasopharyngeal swab specimens and  should not be used as a sole basis for treatment. Nasal washings and  aspirates are unacceptable for Xpert Xpress SARS-CoV-2/FLU/RSV  testing.  Fact Sheet for Patients: PinkCheek.be  Fact Sheet for Healthcare Providers: GravelBags.it  This test is not yet approved or cleared by the [place] FDA and  has been authorized for detection and/or diagnosis of SARS-CoV-2 by  FDA under an Emergency Use Authorization (EUA). This EUA will remain  in effect (meaning this test can be used) for the duration of the  Covid-19 declaration under Section 564(b)(1) of the Act, 21  U.S.C. section 360bbb-3(b)(1), unless the authorization is  terminated or revoked.    Respiratory Syncytial Virus by PCR NEGATIVE NEGATIVE Final    Comment: (NOTE) Fact Sheet for Patients: PinkCheek.be  Fact Sheet for Healthcare Providers: GravelBags.it  This test is not yet approved or cleared by the [place] FDA and  has been authorized for detection and/or diagnosis of SARS-CoV-2 by  FDA under an Emergency Use Authorization (EUA). This EUA will remain  in effect (meaning this test can be used) for the duration of the  COVID-19 declaration under Section 564(b)(1) of the Act, 21 U.S.C.  section 360bbb-3(b)(1), unless the authorization is terminated or  revoked. Performed at [facility], [place] [address], [place], [place] [address]   MRSA PCR Screening     Status: None   Collection Time: 12/15/19  6:37 AM   Specimen:  Nasal Mucosa; Nasopharyngeal  Result Value Ref Range Status   MRSA by PCR NEGATIVE NEGATIVE Final    Comment:        The GeneXpert MRSA Assay (FDA approved for NASAL specimens only), is one component of a comprehensive MRSA colonization surveillance program. It is not intended to diagnose MRSA infection nor to guide or monitor treatment for MRSA infections. Performed at [facility], [place] [address], [place], [place] [address]   Blood Culture (routine x 2)     Status: None   Collection Time: 12/15/19  7:22 AM   Specimen: BLOOD LEFT FOREARM  Result Value Ref Range Status   Specimen Description BLOOD LEFT FOREARM  Final   Special Requests   Final    BOTTLES DRAWN AEROBIC AND ANAEROBIC Blood Culture adequate volume   Culture   Final    NO GROWTH 5 DAYS Performed at [facility], [place] [address], [place], [place] [address]    Report Status 12/20/2019 FINAL  Final  Urine culture     Status: Abnormal   Collection Time: 12/15/19  7:39 AM   Specimen: In/Out Cath Urine  Result Value Ref Range Status   Specimen Description IN/OUT CATH URINE  Final   Special Requests   Final    NONE Performed at [facility], [place]  [address], [place], [place] [address]    Culture 40,000 COLONIES/mL PSEUDOMONAS AERUGINOSA (A)  Final   Report Status 12/17/2019 FINAL  Final   Organism ID, Bacteria PSEUDOMONAS AERUGINOSA (A)  Final      Susceptibility   Pseudomonas aeruginosa - MIC*    CEFTAZIDIME 2 SENSITIVE Sensitive     CIPROFLOXACIN >=4 RESISTANT Resistant     GENTAMICIN <=1 SENSITIVE Sensitive     IMIPENEM <=0.25 SENSITIVE Sensitive     PIP/TAZO <=4 SENSITIVE Sensitive     CEFEPIME Value in next row Sensitive      SENSITIVE8    * 40,000 COLONIES/mL PSEUDOMONAS AERUGINOSA    Please note: You were cared for by a hospitalist during your hospital stay. Once you are discharged, your primary care physician will handle any further medical issues. Please note that NO REFILLS for any  discharge medications will be authorized once you are discharged, as it is imperative that you return to your primary care physician (or establish a relationship with a primary care physician if you do not have one) for your post hospital discharge needs so that they can reassess your need for medications and monitor your lab values.    Time coordinating discharge: 40 minutes  SIGNED:   Shelly Coss, MD  [facility] 12/20/2019, 11:07 AM Pager [phone]  If 7PM-7AM, please contact night-coverage [email or web address] Password [email or web address]

## 2019-12-20 NOTE — Evaluation (Signed)
Occupational Therapy Evaluation Patient Details Name: Ryan Copeland MRN: [id number] DOB: [birth year] Today's Date: 12/20/2019    History of Present Illness Pt is a 69 y/o male admitted secondary to sepsis from UTI. Pt with recent admission secondary to sepsis and PNA. PMH includes CLL, DM, gout, COPD, and COVID.    Clinical Impression   PTA patient independent. Admitted for above and limited by problem list below, including decreased activity tolerance, decreased problem solving and safety awareness.  Patient completing transfers and in room mobility using RW with supervision, ADLs with supervision.  Min cueing throughout session for safety, RW mgmt.  Patient will benefit from further OT services while admitted to optimize independence and safety with ADLs, mobility but anticipate no further needs after dc home.     Follow Up Recommendations  No OT follow up    Equipment Recommendations  None recommended by OT    Recommendations for Other Services       Precautions / Restrictions Precautions Precautions: Fall Restrictions Weight Bearing Restrictions: No      Mobility Bed Mobility               General bed mobility comments: EOB upon entry     Transfers Overall transfer level: Needs assistance Equipment used: Rolling walker (2 wheeled) Transfers: Sit to/from Stand Sit to Stand: Supervision         General transfer comment: for safety, cueing for hand placement     Balance Overall balance assessment: Needs assistance Sitting-balance support: No upper extremity supported;Feet supported Sitting balance-Leahy Scale: Good     Standing balance support: Bilateral upper extremity supported;During functional activity;No upper extremity supported Standing balance-Leahy Scale: Fair Standing balance comment: BUE support dynamically, 0 UE support during ADLs                            ADL either performed or assessed with clinical judgement   ADL Overall  ADL's : Needs assistance/impaired     Grooming: Supervision/safety;Standing   Upper Body Bathing: Set up;Sitting   Lower Body Bathing: Supervison/ safety;Sit to/from stand   Upper Body Dressing : Supervision/safety;Sitting   Lower Body Dressing: Supervision/safety;Sit to/from stand   Toilet Transfer: Supervision/safety;Ambulation;RW   Toileting- Clothing Manipulation and Hygiene: Supervision/safety;Sit to/from stand   Tub/ Shower Transfer: Tub transfer;Supervision/safety;Ambulation;Shower seat;Rolling walker   Functional mobility during ADLs: Rolling walker;Supervision/safety       Vision   Vision Assessment?: No apparent visual deficits     Perception     Praxis      Pertinent Vitals/Pain Pain Assessment: No/denies pain     Hand Dominance Right   Extremity/Trunk Assessment Upper Extremity Assessment Upper Extremity Assessment: Overall WFL for tasks assessed   Lower Extremity Assessment Lower Extremity Assessment: Defer to PT evaluation   Cervical / Trunk Assessment Cervical / Trunk Assessment: Kyphotic   Communication Communication Communication: No difficulties   Cognition Arousal/Alertness: Awake/alert Behavior During Therapy: WFL for tasks assessed/performed Overall Cognitive Status: No family/caregiver present to determine baseline cognitive functioning Area of Impairment: Problem solving                             Problem Solving: Requires verbal cues General Comments: reports he is here for PNA, some decreased problem sovling but short blessed test [facility] scoring 4/28 (normal)   General Comments  Eager for dc home, pt reports unsure if he feels "completely normal" with cognition  so encouraged spouse to assist with IADls at this time     Exercises     Shoulder Instructions      Home Living Family/patient expects to be discharged to:: Private residence Living Arrangements: Spouse/significant other Available Help at Discharge:  Family;Available 24 hours/day Type of Home: House Home Access: Stairs to enter [employer] of Steps: 3-4 Entrance Stairs-Rails: Right Home Layout: Two level;Laundry or work area in [place] Shower/Tub: Teacher, early years/pre: [place]: Environmental consultant - 2 wheels;Tub bench          Prior Functioning/Environment Level of Independence: Independent                 OT Problem List: Decreased activity tolerance;Impaired balance (sitting and/or standing);Decreased safety awareness;Decreased knowledge of use of DME or AE;Decreased knowledge of precautions      OT Treatment/Interventions: Self-care/ADL training;Therapeutic exercise;Balance training;Therapeutic activities;Cognitive remediation/compensation;Patient/family education;DME and/or AE instruction    OT Goals(Current goals can be found in the care plan section) Acute Rehab OT Goals Patient Stated Goal: to go home OT Goal Formulation: With patient  OT Frequency: Min 2X/week   Barriers to D/C:            Co-evaluation              AM-PAC OT "6 Clicks" Daily Activity     Outcome Measure Help from another person eating meals?: None Help from another person taking care of personal grooming?: None Help from another person toileting, which includes using toliet, bedpan, or urinal?: A Little Help from another person bathing (including washing, rinsing, drying)?: A Little Help from another person to put on and taking off regular upper body clothing?: None Help from another person to put on and taking off regular lower body clothing?: A Little 6 Click Score: 21   End of Session Equipment Utilized During Treatment: Rolling walker Nurse Communication: Mobility status  Activity Tolerance: Patient tolerated treatment well Patient left: with call bell/phone within reach;Other (comment) (seated EOB )  OT Visit Diagnosis: Unsteadiness on feet (R26.81);Other symptoms and  signs involving cognitive function                Time: 6734-1937 OT Time Calculation (min): 24 min Charges:  OT General Charges $OT Visit: 1 Visit OT Evaluation $OT Eval Moderate Complexity: 1 Mod OT Treatments $Self Care/Home Management : 8-22 mins  Jolaine Artist, OT Acute Rehabilitation Services Pager [phone] Office [phone]   Delight Stare 12/20/2019, 10:25 AM

## 2019-12-20 NOTE — Care Management Important Message (Signed)
Important Message  Patient Details  Name: Ryan Copeland MRN: [id number] Date of Birth: 11-Apr-1950   Medicare Important Message Given:  Yes     [name] 12/20/2019, 1:57 PM

## 2019-12-20 NOTE — Care Management Important Message (Signed)
Important Message  Patient Details  Name: Ryan Copeland MRN: [id number] Date of Birth: 07/10/1950   Medicare Important Message Given:        [name] 12/20/2019, 1:57 PM

## 2019-12-24 MED FILL — CALQUENCE 100 MG CAPSULE: 100 | 30 days supply | Qty: 60 | Fill #9

## 2019-12-30 ENCOUNTER — Other Ambulatory Visit: Payer: Self-pay | Admitting: Hematology and Oncology

## 2019-12-30 ENCOUNTER — Telehealth: Payer: Self-pay

## 2019-12-30 DIAGNOSIS — D801 Nonfamilial hypogammaglobulinemia: Secondary | ICD-10-CM

## 2019-12-30 NOTE — Telephone Encounter (Signed)
Called and left a message asking him to call the office back to schedule appt for this Thursday.

## 2019-12-30 NOTE — Telephone Encounter (Signed)
He called and left a message requesting appt with Dr. Alvy Bimler.

## 2019-12-30 NOTE — Telephone Encounter (Signed)
Called back. He is rescheduling appt that was canceled. He does not need appt today or tomorrow. His PCP office did labs on 11/12 and platelets are 58.  When would you like th schedule?

## 2019-12-30 NOTE — Telephone Encounter (Signed)
I can see him on Thursday with repeat labs, 20 mins appt

## 2019-12-30 NOTE — Telephone Encounter (Signed)
Called back and spoke with wife. Offered appt this Thursday. He already has appt with urologist that day. Scheduled appt for 11/17. Wife is aware of appt times.

## 2019-12-31 ENCOUNTER — Ambulatory Visit (HOSPITAL_COMMUNITY)
Admission: RE | Admit: 2019-12-31 | Discharge: 2019-12-31 | Disposition: A | Discharge status: Home / Self Care | Payer: Medicare Other | Source: Ambulatory Visit | Attending: Family Medicine | Admitting: Family Medicine

## 2019-12-31 ENCOUNTER — Other Ambulatory Visit: Payer: Self-pay

## 2019-12-31 DIAGNOSIS — J189 Pneumonia, unspecified organism: Secondary | ICD-10-CM | POA: Insufficient documentation

## 2020-01-01 ENCOUNTER — Inpatient Hospital Stay: Payer: Medicare Other | Attending: Hematology and Oncology

## 2020-01-01 ENCOUNTER — Other Ambulatory Visit: Payer: Self-pay

## 2020-01-01 ENCOUNTER — Inpatient Hospital Stay: Payer: Medicare Other

## 2020-01-01 ENCOUNTER — Inpatient Hospital Stay (HOSPITAL_BASED_OUTPATIENT_CLINIC_OR_DEPARTMENT_OTHER): Payer: Medicare Other | Admitting: Hematology and Oncology

## 2020-01-01 ENCOUNTER — Encounter: Payer: Self-pay | Admitting: Hematology and Oncology

## 2020-01-01 DIAGNOSIS — I259 Chronic ischemic heart disease, unspecified: Secondary | ICD-10-CM

## 2020-01-01 DIAGNOSIS — R5383 Other fatigue: Secondary | ICD-10-CM

## 2020-01-01 DIAGNOSIS — E538 Deficiency of other specified B group vitamins: Secondary | ICD-10-CM | POA: Diagnosis not present

## 2020-01-01 DIAGNOSIS — C8308 Small cell B-cell lymphoma, lymph nodes of multiple sites: Secondary | ICD-10-CM

## 2020-01-01 DIAGNOSIS — D801 Nonfamilial hypogammaglobulinemia: Secondary | ICD-10-CM

## 2020-01-01 DIAGNOSIS — J449 Chronic obstructive pulmonary disease, unspecified: Secondary | ICD-10-CM

## 2020-01-01 DIAGNOSIS — Z95828 Presence of other vascular implants and grafts: Secondary | ICD-10-CM

## 2020-01-01 DIAGNOSIS — D61818 Other pancytopenia: Secondary | ICD-10-CM

## 2020-01-01 DIAGNOSIS — Z23 Encounter for immunization: Secondary | ICD-10-CM | POA: Insufficient documentation

## 2020-01-01 DIAGNOSIS — Z79899 Other long term (current) drug therapy: Secondary | ICD-10-CM | POA: Insufficient documentation

## 2020-01-01 DIAGNOSIS — I952 Hypotension due to drugs: Secondary | ICD-10-CM | POA: Insufficient documentation

## 2020-01-01 DIAGNOSIS — D539 Nutritional anemia, unspecified: Secondary | ICD-10-CM

## 2020-01-01 DIAGNOSIS — E440 Moderate protein-calorie malnutrition: Secondary | ICD-10-CM | POA: Insufficient documentation

## 2020-01-01 LAB — COMPREHENSIVE METABOLIC PANEL
ALT: <6 U/L (ref 0–44)
AST: 11 U/L — ABNORMAL LOW (ref 15–41)
Albumin: 2.9 g/dL — ABNORMAL LOW (ref 3.5–5.0)
Alkaline Phosphatase: 83 U/L (ref 38–126)
Anion gap: 7 (ref 5–15)
BUN: 11 mg/dL (ref 8–23)
CO2: 29 mmol/L (ref 22–32)
Calcium: 8.4 mg/dL — ABNORMAL LOW (ref 8.9–10.3)
Chloride: 107 mmol/L (ref 98–111)
Creatinine, Ser: 0.68 mg/dL (ref 0.61–1.24)
GFR, Estimated: >60 mL/min (ref >60)
Glucose, Bld: 98 mg/dL (ref 70–99)
Potassium: 3.8 mmol/L (ref 3.5–5.1)
Sodium: 143 mmol/L (ref 135–145)
Total Bilirubin: 0.5 mg/dL (ref 0.3–1.2)
Total Protein: 5.2 g/dL — ABNORMAL LOW (ref 6.5–8.1)

## 2020-01-01 LAB — CBC WITH DIFFERENTIAL/PLATELET
Abs Immature Granulocytes: 0.05 10*3/uL (ref 0.00–0.07)
Basophils Absolute: 0 10*3/uL (ref 0.0–0.1)
Basophils Relative: 0 %
Eosinophils Absolute: 0 10*3/uL (ref 0.0–0.5)
Eosinophils Relative: 1 %
HCT: 32.3 % — ABNORMAL LOW (ref 39.0–52.0)
Hemoglobin: 10.2 g/dL — ABNORMAL LOW (ref 13.0–17.0)
Immature Granulocytes: 1 %
Lymphocytes Relative: 49 %
Lymphs Abs: 2.6 10*3/uL (ref 0.7–4.0)
MCH: 32.9 pg (ref 26.0–34.0)
MCHC: 31.6 g/dL (ref 30.0–36.0)
MCV: 104.2 fL — ABNORMAL HIGH (ref 80.0–100.0)
Monocytes Absolute: 0.3 10*3/uL (ref 0.1–1.0)
Monocytes Relative: 5 %
Neutro Abs: 2.3 10*3/uL (ref 1.7–7.7)
Neutrophils Relative %: 44 %
Platelets: 66 10*3/uL — ABNORMAL LOW (ref 150–400)
RBC: 3.1 MIL/uL — ABNORMAL LOW (ref 4.22–5.81)
RDW: 16.7 % — ABNORMAL HIGH (ref 11.5–15.5)
WBC: 5.2 10*3/uL (ref 4.0–10.5)
nRBC: 0 % (ref 0.0–0.2)

## 2020-01-01 LAB — TSH: TSH: 1.202 u[IU]/mL (ref 0.320–4.118)

## 2020-01-01 LAB — VITAMIN B12: Vitamin B-12: 189 pg/mL (ref 180–914)

## 2020-01-01 MED ORDER — HEPARIN SOD (PORK) LOCK FLUSH 100 UNIT/ML IV SOLN
500.0000 [IU] | Freq: Once | INTRAVENOUS | Status: AC
  Administered 2020-01-01: 500 [IU]
  Filled 2020-01-01: qty 5

## 2020-01-01 MED ORDER — SODIUM CHLORIDE 0.9% FLUSH
10.0000 mL | Freq: Once | INTRAVENOUS | Status: AC
  Administered 2020-01-01: 10 mL
  Filled 2020-01-01: qty 10

## 2020-01-01 NOTE — Assessment & Plan Note (Signed)
He has excessive fatigue He is noted to have low blood pressure today He has been taking furosemide due to leg swelling I told the patient the leg swelling is due to low albumin status I recommend discontinuation of furosemide

## 2020-01-01 NOTE — Assessment & Plan Note (Signed)
He has multifactorial pancytopenia He is known to have splenomegaly 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, recent drop is likely exacerbated by infection From the anemia standpoint, I suspect is a component of anemia of chronic disease along with vitamin B12 deficiency I plan to replace vitamin B12 with injections in his next visit

## 2020-01-01 NOTE — Assessment & Plan Note (Signed)
He is known to have mixed COPD I reviewed CT imaging with the patient Some of the pulmonary infiltrate has improved but he have persistent consolidation on the lower lung I recommend repeat imaging study again in 3 months He will continue his inhaler and close follow-up with pulmonologist

## 2020-01-01 NOTE — Assessment & Plan Note (Signed)
He tolerated Calquence well but his treatment course is complicated by recurrent infection and hospitalizations We will continue treatment as scheduled I recommend IVIG treatment along with aggressive supportive care

## 2020-01-01 NOTE — Assessment & Plan Note (Signed)
He is noted to have vitamin B12 deficiency I will start him on vitamin B12 replacement therapy

## 2020-01-01 NOTE — Assessment & Plan Note (Signed)
He has bilateral lower extremity edema due to moderate protein calorie malnutrition We discussed importance of discontinuation of furosemide because it is causing dehydration and hypotension I recommend frequent small meals and focus on high-protein diet

## 2020-01-01 NOTE — Progress Notes (Signed)
CARDIOLOGY OFFICE NOTE  Date:  01/15/2020    Ryan Copeland Date of Birth: 1951-01-20 Medical Record #[id number]  PCP:  [name]., PA-C  Cardiologist:  Servando Snare Nahser   Chief Complaint  Patient presents with  . Follow-up    Seen for Dr. Acie Fredrickson    History of Present Illness: Ryan Copeland is a 69 y.o. male who presents today for a follow up visit.  Seen for Dr. Acie Fredrickson. Former patient of Dr. Susa Simmonds.Primarily follows with me.  Has known prior NSTEMI and stenting of the LAD in 2011. Underwent repeat cath due to refractory symptoms back in August of 2016 - managed medically. Other issues include tobacco abuse, PVD with past iliac stenting(previously followed at [facility]), HTN, HLD, DM, chronic thrombocytopenia and CLL- small cell B cell lymphoma. He sees neurology for his peripheral neuropathy.  Seen back in Septemberof 2017- had gained weight. Some intermittent chest heaviness - no response with NTG but I added Imdur to his regimen.When seenback in October - still with atypical chest pain - felt more like a pulled muscle but no relief with NSAID. He thought the Imdur had helped however. I talked with Dr. Acie Fredrickson - wanted to avoid Myoview - most likely it was felt to turn out abnormal. Elected to just follow for now.Ended uphaving GI work upinDecemberof 2017- this was felt to be possibly causing some of his prior chest pain -turned out to be a yeast infection and with treatment his chest pain resolved. We got him offImdur so he could resume Viagra. Still struggling with trying to stop smoking.He did have to go back on treatment for his CLL in 2018.   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 Hadsome swelling of his lower legs ata pastvisit with me- not short of breath. I put him on very low dose  prn Lasix. Not smoking. No chest pain. Cardiac statuswasfelt to be stable.  Last seen back in May - felt to be doing ok from our standpoint.   He was vaccinated for COVID in August and in September - had CAP/septis and urinary retention in October and failed on outpatient therapy - ended up admitted - then readmitted with septic shock - was in the ICU - required Midodrine. Had pancytopenia. He has had a persistent left lower lobe consolidation - back on antibiotics. Concern for non-resolving or opportunistic infection. His CT showed new airspace disease with continued left lower lobe disease. For repeat CT in a few weeks. Sputum pending for AF[employer].   Comes in today. Here with [name] today. She augments the history today. We have reviewed the events over the past few months. He has lost a significant amount of weight. He is on Midodrine for his BP.  Their diary is reviewed. BP averaging around 100 to 100 - he is on TID therapy. No chest pain. Breathing is fair. He is eating. He has an indwelling catheter in place due to urinary retention. He is seeing urology - may be looking at a surgical procedure in early 2022. Needs better pulmonary status. His cough has improved - he is not coughing anything up at this time. Not able to get sputum. Advised to let pulmonary know about this. He is wearing support stockings. He is liberalizing his salt. Using some prn Lasix for asymmetric swelling.    Past Medical History:  Diagnosis Date  . Anxiety 01/30/2014  . Back injury    lower disc  . CAD (coronary artery disease)   . CLL (chronic lymphocytic leukemia) ([place]) 03/18/2011  . COPD (chronic obstructive pulmonary disease) ([place])   . Diabetes mellitus ([place])    Type 2   . ECRB (extensor carpi radialis brevis) tenosynovitis   . GERD (gastroesophageal reflux disease)    takes Nexium if needed  . Hyperlipidemia   . Lateral epicondylitis of left elbow   . MI, acute, non ST segment elevation ([place]) 06/28/2009   with  stenting of the LAD  . Neuromuscular disorder ([place])    peripheral neuropathy  . PVD (peripheral vascular disease) ([place])   . Tobacco abuse     Past Surgical History:  Procedure Laterality Date  . ADENOIDECTOMY  1955  . CARDIAC CATHETERIZATION    . CARDIAC CATHETERIZATION N/A 09/26/2014   Procedure: Left Heart Cath and Coronary Angiography;  Surgeon: Peter M Martinique, MD;  Location: [facility];  Service: Cardiovascular;  Laterality: N/A;  . carpel tunnel release Left 04-1989  . carpel tunnel release  Right 01-1989  . CHOLECYSTECTOMY  2007  . CORONARY STENT PLACEMENT  May 2011  . femoral stents    . IR CV LINE INJECTION  08/18/2017  . IR CV LINE INJECTION  09/01/2017  . IR CV LINE INJECTION  02/02/2018  . LATERAL EPICONDYLE RELEASE Left 02/12/2014   Procedure: LEFT ELBOW DEBRIDEMENT WITH TENDON REPAIR ;  Surgeon: Lorn Junes, MD;  Location: [facility];  Service: Orthopedics;  Laterality: Left;  . LEFT CAI STENT/PTA AND POPLITEAL ARTERY/TIBIAL THROMBECTOMY     . LEFT HEART CATHETERIZATION WITH CORONARY ANGIOGRAM N/A 08/26/2011   Procedure: LEFT HEART CATHETERIZATION WITH CORONARY ANGIOGRAM;  Surgeon: Peter M Martinique, MD;  Location[facility];  Service: Cardiovascular;  Laterality: N/A;  . PERIPHERAL VASCULAR CATHETERIZATION N/A 01/01/2015   Procedure: Abdominal Aortogram;  Surgeon: Conrad Gap, MD;  Location: [facility];  Service: Cardiovascular;  Laterality: N/A;  . TARSAL TUNNEL RELEASE Bilateral 08-2007     Medications: Current Meds  Medication Sig  . acyclovir (ZOVIRAX) 400 MG tablet Take 1 tablet (400 mg total) by mouth 2 (two) times daily.  Marland Kitchen albuterol (PROVENTIL HFA) 108 (90 Base) MCG/ACT inhaler TAKE 2 PUFFS BY MOUTH EVERY 6 HOURS AS NEEDED FOR WHEEZE OR SHORTNESS OF BREATH  . allopurinol (ZYLOPRIM) 300 MG tablet TAKE 1 TABLET BY MOUTH EVERY DAY  . ALPRAZolam (XANAX) 0.5 MG tablet Take 0.5 mg by mouth as needed for anxiety.  Marland Kitchen b complex vitamins tablet Take 1  tablet by mouth daily.   . benzonatate (TESSALON) 100 MG capsule TAKE TWO CAPSULES BY MOUTH THREE TIMES A DAY AS NEEDED COUGH  . CALQUENCE 100 MG capsule TAKE 100 MG BY MOUTH 2 (TWO) TIMES DAILY.  Marland Kitchen Cholecalciferol (VITAMIN D-1000 MAX ST) 1000 units tablet Take 1,000 Units by mouth daily.   . famotidine (PEPCID) 20 MG tablet TAKE 1 TABLET BY MOUTH TWICE A DAY  . fluticasone furoate-vilanterol (BREO ELLIPTA) 200-25 MCG/INH AEPB Inhale 1 puff into the lungs daily.  Marland Kitchen HYDROcodone-acetaminophen (NORCO) 10-325 MG tablet Take 1 tablet by mouth See admin instructions. Every 6 to 8 hours as needed for pain  . lidocaine-prilocaine (EMLA) cream APPLY TO AFFECTED AREA ONCE  . loperamide (IMODIUM) 2 MG capsule Take 2 capsules (4 mg total) by mouth every 6 (six) hours as needed for diarrhea or loose stools.  . meclizine (ANTIVERT)  12.5 MG tablet Take 12.5 mg by mouth every 6 (six) hours as needed for dizziness.   . metFORMIN (GLUCOPHAGE-XR) 500 MG 24 hr tablet Take 500-1,000 mg by mouth See admin instructions. Takes one tablet (500mg )  in AM and 2 tablets (1000mg ) at night.  . midodrine (PROAMATINE) 5 MG tablet Take 1 tablet (5 mg total) by mouth 3 (three) times daily with meals.  . mometasone (NASONEX) 50 MCG/ACT nasal spray Place 2 sprays into the nose daily.  . montelukast (SINGULAIR) 10 MG tablet TAKE 1 TABLET BY MOUTH EVERY DAY  . nitroGLYCERIN (NITROSTAT) 0.4 MG SL tablet PLACE 1 TABLET UNDER THE TONGUE EVERY 5 MINUTES X 3 DOSES AS NEEDED FOR CHEST PAIN *MAX 3 DOSES*  . pregabalin (LYRICA) 200 MG capsule TAKE 1 CAPSULE (200 MG TOTAL) BY MOUTH 3 (THREE) TIMES DAILY.  . rosuvastatin (CRESTOR) 5 MG tablet TAKE 1 TABLET BY MOUTH EVERY DAY  . tamsulosin (FLOMAX) 0.4 MG CAPS capsule Take 1 capsule (0.4 mg total) by mouth daily.  Marland Kitchen zolpidem (AMBIEN) 10 MG tablet Take 10 mg by mouth at bedtime as needed for sleep.  . [DISCONTINUED] ALPRAZolam (XANAX) 0.5 MG tablet Take 0.5 mg by mouth daily as needed.  .  [DISCONTINUED] midodrine (PROAMATINE) 5 MG tablet Take 1 tablet (5 mg total) by mouth 3 (three) times daily with meals.     Allergies: Allergies  Allergen Reactions  . Immune Globulin Hives and Other (See Comments)    Pt reports hives/rash on body with IV Privigen. He was changed to IV Gamunex-C and had many infusions without adverse side effects. Pt reports hives/rash on body with IV Privigen. He was changed to IV Gamunex-C and had many infusions without adverse side effects.  . Codeine Hives    Pt states he can take a few, more reaction with extended doses.    Social History: The patient  reports that he quit smoking about 3 years ago. His smoking use included cigarettes. He has a 44.00 pack-year smoking history. He has quit using smokeless tobacco. He reports that he does not drink alcohol and does not use drugs.   Family History: The patient's family history includes Cancer in his mother; Heart disease in his father; Heart failure (age of onset: 86) in his father; Lung cancer (age of onset: 67) in his mother.   Review of Systems: Please see the history of present illness.   All other systems are reviewed and negative.   Physical Exam: VS:  BP 120/60   Pulse 77   Ht 6' (1.829 m)   Wt 162 lb (73.5 kg)   SpO2 99%   BMI 21.97 kg/m  .  BMI Body mass index is 21.97 kg/m.  Wt Readings from Last 3 Encounters:  01/15/20 162 lb (73.5 kg)  01/07/20 162 lb 6.4 oz (73.7 kg)  01/01/20 164 lb 9.6 oz (74.7 kg)    General: Alert - looks chronically ill - has lost weight - he weighed 184 back in May at our last visit.   He was otherwise not examined today. This was a lengthy verbal discussion.   LABORATORY DATA:  EKG:  EKG is not ordered today.   Lab Results  Component Value Date   WBC 5.2 01/01/2020   HGB 10.2 (L) 01/01/2020   HCT 32.3 (L) 01/01/2020   PLT 66 (L) 01/01/2020   GLUCOSE 98 01/01/2020   CHOL 79 (L) 06/19/2019   TRIG 44 06/19/2019   HDL 34 (L) 06/19/2019  LDLDIRECT 117.6 08/22/2011   LDLCALC 33 06/19/2019   ALT <6 01/01/2020   AST 11 (L) 01/01/2020   NA 143 01/01/2020   K 3.8 01/01/2020   CL 107 01/01/2020   CREATININE 0.68 01/01/2020   BUN 11 01/01/2020   CO2 29 01/01/2020   TSH 1.202 01/01/2020   INR 1.2 12/15/2019   HGBA1C 5.1 11/28/2019     BNP (last 3 results) Recent Labs    12/15/19 0647  BNP 203.2*    ProBNP (last 3 results) No results for input(s): PROBNP in the last 8760 hours.   Other Studies Reviewed Today:  Coronary angiography from 09/2014:  Coronary dominance: right  Left mainstem: Normal.  Left anterior descending (LAD): The stent in the proximal LAD is widely patent throughout. There is moderate 20% narrowing in the LAD proximal to the stent. The remainder of the vessels without significant disease.  Left circumflex (LCx): The left circumflex gives rise to 2 large marginal branches. There is 20% narrowing prior to the takeoff of the first OM.  Right coronary artery (RCA): The right coronary is a dominant vessel. It has diffuse 70% disease in the proximal to mid vessel. Is occluded at the crux. There are excellent right to right and left to right collaterals.  Left ventriculography: Left ventricular systolic function is normal, LVEF is estimated at 55-65%, there is no significant mitral regurgitation  Final Conclusions:  1. Single vessel obstructive coronary disease. 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 The stent in the proximal LAD is widely patent.  2. Normal LV function.  Recommendations: Continue medical management.  Peter Martinique  08/26/2011, 9:09 AM   ASSESSMENT & PLAN:   1. Recent pneumonia following COVID 19 illness - subsequent septic shock - still with abnormal CT scan - may be looking at possible bronchoscopy from their report.   2. CAD - stable cath from 2016 - total occlusion of the RCA chronic - has good collateral flow - patent  stent in proximal LAD. He has not had chest pain.   3. HTN - now low - requiring Midodrine TID. Continue to liberalize salt and wear support stockings.   4. HLD - on statin - not discussed.   5. COPD - per pulmonary.   6. CLL/chronic thrombocytopenia - plan per Oncology. Noted concern for opportunistic infection.   7. NIDDM - not discussed.   8. PAD - with priorL CIA PTA+S, L pop artery thrombectomy by Dr. Amedeo Plenty- has not seen [facility] since 01/2016 -he has not felt that he needed follow up. Not discussed.   Current medicines are reviewed with the patient today.  The patient does not have concerns regarding medicines other than what has been noted above.  The following changes have been made:  See above.  Labs/ tests ordered today include:   No orders of the defined types were placed in this encounter.    Disposition:   FU with me in late January. This may be for a pre op clearance. I have told them both that I am leaving - we will get them both with Dr. Johney Frame in light of Dr. Elmarie Shiley slowing down.    Patient is agreeable to this plan and will call if any problems develop in the interim.   SignedTruitt Merle, NP  01/15/2020 12:56 PM  [facility] [address] [place], [place]  [address] Phone: [phone] Fax: [phone]

## 2020-01-01 NOTE — Assessment & Plan Note (Signed)
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

## 2020-01-01 NOTE — Progress Notes (Signed)
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 [facility]) He tolerated [place] well but his treatment course is complicated by recurrent infection and hospitalizations We will continue treatment as scheduled I recommend IVIG treatment along with aggressive supportive care  Pancytopenia, acquired [facility]) He has multifactorial pancytopenia He is known to have splenomegaly 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, recent drop is likely exacerbated by infection From the anemia standpoint, I suspect is a component of anemia of chronic disease along with vitamin B12 deficiency I plan to replace vitamin B12 with injections in his next visit  Vitamin B12 deficiency He is noted to have vitamin B12 deficiency I will start him on vitamin B12 replacement therapy  Hypogammaglobulinemia, acquired He has recurrent upper respiratory tract infection likely secondary to acquired hypogammaglobulinemia related to his CLL He had numerous hospitalization related to recurrent pneumonia I recommend monthly IVIG treatment I will prescribe 1 g/kg IVIG for him every 3 to 4 weeks in an attempt to reduce his risk of severe infection and hospitalization He is in agreement  Hypotension due to drugs He has excessive fatigue He is noted to have low blood pressure today He has been taking furosemide due to leg swelling I told the patient the leg swelling is due to low albumin status I recommend discontinuation of furosemide  Protein-calorie malnutrition, moderate ([place]) He has bilateral lower extremity edema due to moderate protein calorie malnutrition We discussed importance of discontinuation of furosemide because it is causing  dehydration and hypotension I recommend frequent small meals and focus on high-protein diet  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   All questions were answered. The patient knows to call the clinic with any problems, questions or concerns. The total time spent in the appointment was 40 minutes encounter with patients including review of chart and various tests results, discussions about plan of care and coordination of care plan   [name], MD 01/01/2020 2:44 PM  INTERVAL HISTORY: Please see below for problem oriented charting. He returns with his wife for further follow-up after recent hospitalization He felt weak The patient denies any recent signs or symptoms of bleeding such as spontaneous epistaxis, hematuria or hematochezia. 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  No evidence of aortic dissection or other acute process in the chest. 2.  Centrilobular emphysema with a 5 mm right lung nodule. Given the  concurrent centrilobular emphysema, follow-up chest CT at 6 -12 months is recommended.  3.  Coronary artery atherosclerosis which is age  advanced. 4.  Prominent thoracic lymph nodes.  These can be reevaluated at follow-up.   05/06/2010 Imaging   1.  Multiple small periaortic lymph nodes consistent with the patient's history of the chronic lymphocytic leukemia. 2.  No evidence of solid organ involvement   11/03/2011 Imaging   1.  Interval progression of abdominal and pelvic adenopathy. 2.  Progression of splenomegaly.  The spleen now measures 23 cm in length   11/18/2011 Bone Marrow Biopsy   Bone Marrow, Aspirate,Biopsy, and Clot, right iliac bone - HYPERCELLULAR BONE MARROW WITH EXTENSIVE INVOLVEMENT BY CHRONIC LYMPHOCYTIC LEUKEMIA. PERIPHERAL BLOOD: - CHRONIC LYMPHOCYTIC LEUKEMIA   03/08/2012 Imaging   1.  Progressive increase in retroperitoneal, iliac, and inguinal lymphadenopathy. 2.  Interval increase in massive splenomegaly.    06/01/2012 Procedure   Placement of single lumen port a cath via right internal jugular vein.  The catheter tip lies at the cavoatrial junction.  A power injectable port a cath was placed and is ready for immediate use   06/20/2012 - 11/23/2012 Chemotherapy   He received FCR x 6 cycles   12/19/2012 Imaging   Left common iliac stent. Abdominal vasculature remains patent. Improving supraclavicular and axillary lymphadenopathy. Residual right subpectoral nodes measure up to 10 mm short axis. Improving retroperitoneal lymphadenopathy, measuring up to 16 mm short axis. Improving splenomegaly, measuring 18.7 cm.    01/20/2016 Imaging   1. Stable exam.  No new or progressive findings. 2. No CT findings to explain odynophagia   09/02/2016 Pathology Results   The findings are consistent with involvement by previously known chronic lymphocytic leukemia   09/02/2016 Pathology Results   FISH for CLL came back positive for deletion 13q   09/15/2016 Imaging   1. Borderline enlarged abdominal and pelvic lymph nodes. Compared with 11/03/2011 these are decreased in size as detailed above. 2. Persistent  splenomegaly. 3. Aortic Atherosclerosis (ICD10-I70.0). LAD coronary artery calcification noted.   10/06/2016 - 02/24/2017 Chemotherapy   He received Bendamustine and Rituxan   11/03/2016 Adverse Reaction   Dose of Bendamustine is reduced due to severe pancytopenia   12/28/2016 Imaging   1. Borderline enlarged abdominal peritoneal ligament and abdominal retroperitoneal lymph nodes, stable. 2. Splenomegaly. 3.  Aortic atherosclerosis (ICD10-170.0).   03/22/2017 PET scan   1. No hypermetabolic adenopathy identified within the neck, chest, abdomen or pelvis. 2. Prominent left retroperitoneal node measures 1.6 cm without significant FDG uptake. 3. Splenomegaly. 4. Aortic Atherosclerosis (ICD10-I70.0) and Emphysema (ICD10-J43.9). LAD and left circumflex atherosclerotic calcifications noted.   02/02/2018 Procedure   IMPRESSION: Widely patent right IJ power port catheter.   04/06/2018 Imaging   1. Interval enlargement of axillary, mediastinal, and retroperitoneal lymph nodes, as well as increased splenomegaly, findings concerning for progression of CLL in comparison to prior PET-CT dated 03/22/2017.  2.  Other chronic and incidental findings as detailed above.    04/16/2018 -  Chemotherapy   The patient had acalabrutinib for chemo   04/17/2018 - 04/21/2018 Hospital Admission   He was hospitalized for influenza   11/29/2018 Imaging   1. Interval response to therapy. No thoracic added not scratch set no thoracic or pelvic adenopathy identified. Significant improvement in abdominal adenopathy. Largest remaining lymph node measures 1.3cm in the left retroperitoneal region. Previously 2.5 cm. 2. Persistent splenomegaly, improved from previous exam. 3. No new or progressive disease identified within the chest, abdomen or  pelvis. 4. Stable appearance of 5 mm right middle lobe lung nodule. 5. Aortic Atherosclerosis (ICD10-I70.0) and Emphysema (ICD10-J43.9). Coronary artery calcifications.        REVIEW OF SYSTEMS:   Constitutional: Denies fevers, chills or abnormal weight loss Eyes: Denies blurriness of vision Ears, nose, mouth, throat, and face: Denies mucositis or sore throat Respiratory: Denies cough, dyspnea or wheezes Cardiovascular: Denies palpitation, chest discomfort  Gastrointestinal:  Denies nausea, heartburn or change in bowel habits Skin: Denies abnormal skin rashes Lymphatics: Denies new lymphadenopathy Behavioral/Psych: Mood is stable, no new changes  All other systems were reviewed with the patient and are negative.  I have reviewed the past medical history, past surgical history, social history and family history with the patient and they are unchanged from previous note.  ALLERGIES:  is allergic to privigen [immune globulin] and codeine.  MEDICATIONS:  Current Outpatient Medications  Medication Sig Dispense Refill  . acyclovir (ZOVIRAX) 400 MG tablet TAKE 1 TABLET BY MOUTH TWICE A DAY (Patient taking differently: Take 400 mg by mouth 2 (two) times daily. ) 180 tablet 1  . albuterol (PROVENTIL HFA) 108 (90 Base) MCG/ACT inhaler TAKE 2 PUFFS BY MOUTH EVERY 6 HOURS AS NEEDED FOR WHEEZE OR SHORTNESS OF BREATH (Patient taking differently: Inhale 2 puffs into the lungs every 6 (six) hours as needed for wheezing or shortness of breath. TAKE 2 PUFFS BY MOUTH EVERY 6 HOURS AS NEEDED FOR WHEEZE OR SHORTNESS OF BREATH) 18 g 11  . allopurinol (ZYLOPRIM) 300 MG tablet TAKE 1 TABLET BY MOUTH EVERY DAY (Patient taking differently: Take 300 mg by mouth daily. ) 90 tablet 1  . ALPRAZolam (XANAX) 0.25 MG tablet Take 0.5 mg by mouth as needed for anxiety.     Marland Kitchen b complex vitamins tablet Take 1 tablet by mouth daily.     . benzonatate (TESSALON) 100 MG capsule Take 2 capsules (200 mg total) by mouth 3 (three) times daily as needed for cough. 30 capsule 2  . CALQUENCE 100 MG capsule TAKE 100 MG BY MOUTH 2 (TWO) TIMES DAILY. (Patient taking differently: Take 100 mg by mouth 2 (two)  times daily. ) 60 capsule 11  . Cholecalciferol (VITAMIN D-1000 MAX ST) 1000 units tablet Take 1,000 Units by mouth daily.     . famotidine (PEPCID) 20 MG tablet TAKE 1 TABLET BY MOUTH TWICE A DAY (Patient taking differently: Take 20 mg by mouth 2 (two) times daily as needed for heartburn. ) 180 tablet 3  . fluticasone furoate-vilanterol (BREO ELLIPTA) 200-25 MCG/INH AEPB Inhale 1 puff into the lungs daily. 1 each 5  . HYDROcodone-acetaminophen (NORCO) 10-325 MG tablet Take 1 tablet by mouth See admin instructions. Every 6 to 8 hours as needed for pain  0  . HYDROcodone-homatropine (HYCODAN) 5-1.5 MG/5ML syrup Take 5 mLs by mouth every 6 (six) hours as needed for cough.    . lidocaine-prilocaine (EMLA) cream APPLY TO AFFECTED AREA ONCE (Patient taking differently: Apply 1 application topically once. ) 30 g 3  . loperamide (IMODIUM) 2 MG capsule Take 2 capsules (4 mg total) by mouth every 6 (six) hours as needed for diarrhea or loose stools. 90 capsule 2  . meclizine (ANTIVERT) 12.5 MG tablet Take 12.5 mg by mouth every 6 (six) hours as needed for dizziness.   0  . metFORMIN (GLUCOPHAGE-XR) 500 MG 24 hr tablet Take 500-1,000 mg by mouth See admin instructions. Takes one tablet (525m)  in AM and 2 tablets (10069m at night.    .Marland Kitchen  midodrine (PROAMATINE) 5 MG tablet Take 1 tablet (5 mg total) by mouth 3 (three) times daily with meals. 90 tablet 1  . mometasone (NASONEX) 50 MCG/ACT nasal spray Place 2 sprays into the nose daily. 1 each 11  . montelukast (SINGULAIR) 10 MG tablet TAKE 1 TABLET BY MOUTH EVERY DAY 90 tablet 1  . nitroGLYCERIN (NITROSTAT) 0.4 MG SL tablet PLACE 1 TABLET UNDER THE TONGUE EVERY 5 MINUTES X 3 DOSES AS NEEDED FOR CHEST PAIN *MAX 3 DOSES* (Patient taking differently: Place 0.4 mg under the tongue See admin instructions. PLACE 1 TABLET UNDER THE TONGUE EVERY 5 MINUTES X 3 DOSES AS NEEDED FOR CHEST PAIN *MAX 3 DOSES*) 25 tablet 3  . predniSONE (DELTASONE) 20 MG tablet Take 20 mg by  mouth See admin instructions. Take 1 tablet (66m) by mouth three times daily for 3 days, then take 1 tablet (240m by mouth twice daily for 3 days, then take 1 tablet by mouth daily for 3 days.    . pregabalin (LYRICA) 200 MG capsule TAKE 1 CAPSULE (200 MG TOTAL) BY MOUTH 3 (THREE) TIMES DAILY. 270 capsule 1  . rosuvastatin (CRESTOR) 5 MG tablet TAKE 1 TABLET BY MOUTH EVERY DAY (Patient taking differently: Take 5 mg by mouth daily. ) 90 tablet 3  . tamsulosin (FLOMAX) 0.4 MG CAPS capsule Take 1 capsule (0.4 mg total) by mouth daily. 30 capsule 0  . zolpidem (AMBIEN) 10 MG tablet Take 10 mg by mouth at bedtime as needed for sleep.     No current facility-administered medications for this visit.   Facility-Administered Medications Ordered in Other Visits  Medication Dose Route Frequency Provider Last Rate Last Admin  . 0.9 %  sodium chloride infusion   Intravenous Continuous GoHeath LarkMD 50 mL/hr at 03/07/14 1005 New Bag at 03/07/14 1005  . sodium chloride 0.9 % injection 10 mL  10 mL Intracatheter PRN KhMarcy PanningMD   10 mL at 08/22/12 1721    PHYSICAL EXAMINATION: ECOG PERFORMANCE STATUS: 2 - Symptomatic, <50% confined to bed  Vitals:   01/01/20 0948  BP: (!) 85/45  Pulse: 75  Resp: 18  Temp: 98.1 F (36.7 C)  SpO2: 100%   Filed Weights   01/01/20 0948  Weight: 164 lb 9.6 oz (74.7 kg)    GENERAL:alert, no distress and comfortable SKIN: Noted excessive skin bruises HEART: noted moderate lower extremity edema NEURO: alert & oriented x 3 with fluent speech, no focal motor/sensory deficits  LABORATORY DATA:  I have reviewed the data as listed    Component Value Date/Time   NA 143 01/01/2020 0851   NA 142 06/19/2019 0848   NA 136 01/26/2017 0940   K 3.8 01/01/2020 0851   K 4.0 01/26/2017 0940   CL 107 01/01/2020 0851   CL 104 08/03/2012 1229   CO2 29 01/01/2020 0851   CO2 23 01/26/2017 0940   GLUCOSE 98 01/01/2020 0851   GLUCOSE 146 (H) 01/26/2017 0940   GLUCOSE  223 (H) 08/03/2012 1229   BUN 11 01/01/2020 0851   BUN 13 06/19/2019 0848   BUN 9.6 01/26/2017 0940   CREATININE 0.68 01/01/2020 0851   CREATININE 1.04 04/24/2018 0821   CREATININE 1.0 01/26/2017 0940   CALCIUM 8.4 (L) 01/01/2020 0851   CALCIUM 8.7 01/26/2017 0940   PROT 5.2 (L) 01/01/2020 0851   PROT 5.9 (L) 04/13/2017 0823   PROT 5.7 (L) 01/26/2017 0940   ALBUMIN 2.9 (L) 01/01/2020 0851   ALBUMIN 3.6 01/26/2017 0940  AST 11 (L) 01/01/2020 0851   AST 13 (L) 04/24/2018 0821   AST 13 01/26/2017 0940   ALT <6 01/01/2020 0851   ALT 10 04/24/2018 0821   ALT 12 01/26/2017 0940   ALKPHOS 83 01/01/2020 0851   ALKPHOS 78 01/26/2017 0940   BILITOT 0.5 01/01/2020 0851   BILITOT 0.7 04/24/2018 0821   BILITOT 0.64 01/26/2017 0940   GFRNONAA >60 01/01/2020 0851   GFRNONAA >60 04/24/2018 0821   GFRAA >60 10/25/2019 0819   GFRAA >60 04/24/2018 0821    No results found for: SPEP, UPEP  Lab Results  Component Value Date   WBC 5.2 01/01/2020   NEUTROABS 2.3 01/01/2020   HGB 10.2 (L) 01/01/2020   HCT 32.3 (L) 01/01/2020   MCV 104.2 (H) 01/01/2020   PLT 66 (L) 01/01/2020      Chemistry      Component Value Date/Time   NA 143 01/01/2020 0851   NA 142 06/19/2019 0848   NA 136 01/26/2017 0940   K 3.8 01/01/2020 0851   K 4.0 01/26/2017 0940   CL 107 01/01/2020 0851   CL 104 08/03/2012 1229   CO2 29 01/01/2020 0851   CO2 23 01/26/2017 0940   BUN 11 01/01/2020 0851   BUN 13 06/19/2019 0848   BUN 9.6 01/26/2017 0940   CREATININE 0.68 01/01/2020 0851   CREATININE 1.04 04/24/2018 0821   CREATININE 1.0 01/26/2017 0940      Component Value Date/Time   CALCIUM 8.4 (L) 01/01/2020 0851   CALCIUM 8.7 01/26/2017 0940   ALKPHOS 83 01/01/2020 0851   ALKPHOS 78 01/26/2017 0940   AST 11 (L) 01/01/2020 0851   AST 13 (L) 04/24/2018 0821   AST 13 01/26/2017 0940   ALT <6 01/01/2020 0851   ALT 10 04/24/2018 0821   ALT 12 01/26/2017 0940   BILITOT 0.5 01/01/2020 0851   BILITOT 0.7  04/24/2018 0821   BILITOT 0.64 01/26/2017 0940       RADIOGRAPHIC STUDIES: I have reviewed CT imaging with the patient and family I have personally reviewed the radiological images as listed and agreed with the findings in the report. CT Head Wo Contrast  Result Date: 12/15/2019 CLINICAL DATA:  Mental status change. EXAM: CT HEAD WITHOUT CONTRAST TECHNIQUE: Contiguous axial images were obtained from the base of the skull through the vertex without intravenous contrast. COMPARISON:  11/29/2019 FINDINGS: Brain: No evidence of acute infarction, hemorrhage, hydrocephalus, extra-axial collection or mass lesion/mass effect. Mild areas of predominantly subcortical white matter hypoattenuation are noted consistent with chronic microvascular ischemic change. Vascular: No hyperdense vessel or unexpected calcification. Skull: Normal. Negative for fracture or focal lesion. Sinuses/Orbits: Significant sinus disease. Right maxillary sinus is mostly occluded due to mucosal thickening. Moderate mucosal thickening with dependent secretions in the left maxillary sinus. Mucosal thickening occludes and partly occludes multiple ethmoid air cells. Mild mucosal thickening lies in the inferior frontal sinuses. Other: None. IMPRESSION: 1. No acute intracranial abnormalities. 2. Mild chronic microvascular ischemic change. 3. Sinus mucosal thickening as detailed above, unchanged compared to the recent prior head CT. Electronically Signed   By: Lajean Manes M.D.   On: 12/15/2019 10:24   CT CHEST WO CONTRAST  Result Date: 01/01/2020 CLINICAL DATA:  CLL diagnosed 2012, chemotherapy complete, recent pneumonia EXAM: CT CHEST WITHOUT CONTRAST TECHNIQUE: Multidetector CT imaging of the chest was performed following the standard protocol without IV contrast. COMPARISON:  11/29/2019 FINDINGS: Cardiovascular: Right chest port catheter. Aortic atherosclerosis. Normal heart size. Three-vessel coronary artery  calcifications and stents.  Unchanged trace pericardial effusion. Mediastinum/Nodes: Unchanged enlarged left hilar lymph node, measuring up to 2.3 x 1.6 cm (series 2, image 77). Thyroid gland, trachea, and esophagus demonstrate no significant findings. Lungs/Pleura: Mild centrilobular and paraseptal emphysema. There is extensive, bilateral heterogeneous and consolidative airspace disease, most significant in the left lower lobe, and significantly worsened in the left lower lobe compared to prior examination. There is additional, significant new airspace disease in the dependent right lower lobe (series 7, image 117). Previously noted cavitary airspace disease in the lingula is improved, with persistent irregular opacities and consolidations (series 7, image 81). Small left pleural effusion. Upper Abdomen: No acute abnormality.  Partially imaged splenomegaly. Musculoskeletal: No chest wall mass or suspicious bone lesions identified. IMPRESSION: 1. There is extensive, bilateral heterogeneous and consolidative airspace disease, most significant in the left lower lobe, and significantly worsened in the left lower lobe compared to prior examination. There is additional, significant new airspace disease in the dependent right lower lobe. Previously noted cavitary airspace disease in the lingula is improved, with persistent irregular opacities and consolidations. Findings are consistent with ongoing, but generally worsened multifocal infection. Pulmonary lymphomatous involvement is not favored but a general differential consideration. 2. Small left pleural effusion. 3. Unchanged enlarged left hilar lymph node, nonspecific. 4. Emphysema. 5. Coronary artery disease. 6. Partially imaged splenomegaly. Aortic Atherosclerosis (ICD10-I70.0) and Emphysema (ICD10-J43.9). Electronically Signed   By: Eddie Candle M.D.   On: 01/01/2020 09:58   DG CHEST PORT 1 VIEW  Result Date: 12/18/2019 CLINICAL DATA:  Pneumonia EXAM: PORTABLE CHEST 1 VIEW COMPARISON:   12/15/2019 FINDINGS: Stable borderline heart size. Airspace opacity in the left more than right. Possible trace pleural fluid and new cephalized blood flow. No pneumothorax. Porta catheter on the right with tip at the upper cavoatrial junction. IMPRESSION: Unchanged pulmonary infiltrates. Possible interval vascular congestion. Electronically Signed   By: Monte Fantasia M.D.   On: 12/18/2019 07:00   DG Chest Port 1 View  Result Date: 12/15/2019 CLINICAL DATA:  Possible sepsis. EXAM: PORTABLE CHEST 1 VIEW COMPARISON:  11/28/2019 and earlier studies. FINDINGS: Patchy airspace consolidation is noted in the left mid and both lower lungs. The consolidation on the left is less dense than it was on the most recent prior exam consistent with mild improvement. No change on the right. No new lung abnormalities. No convincing pleural effusion.  No pneumothorax. Stable right anterior chest wall Port-A-Cath, tip in the lower superior vena cava. IMPRESSION: 1. Mild interval improvement with decreased density of the consolidation in the left mid to lower lung. 2. No other change. Electronically Signed   By: Lajean Manes M.D.   On: 12/15/2019 07:10   ECHOCARDIOGRAM COMPLETE  Result Date: 12/17/2019    ECHOCARDIOGRAM REPORT   Patient Name:   KEEVAN WOLZ Date of Exam: 12/17/2019 Medical Rec #:  [id number]     Height:       72.0 in Accession #:    [id number]    Weight:       175.0 lb Date of Birth:  09/14/1950     BSA:          2.013 m Patient Age:    29 years      BP:           114/62 mmHg Patient Gender: M             HR:           73 bpm. Exam Location:  Inpatient Procedure: 2D Echo, Cardiac Doppler and Color Doppler Indications:    Cardiomegaly 429.3 / I51.7  History:        Patient has prior history of Echocardiogram examinations, most                 recent 12/15/2015. Previous Myocardial Infarction and CAD, COPD;                 Risk Factors:Dyslipidemia and Diabetes.  Sonographer:    Bernadene Person RDCS Referring  Phys: [id number] [place]  1. Left ventricular ejection fraction, by estimation, is 60 to 65%. The left ventricle has normal function. The left ventricle has no regional wall motion abnormalities. Left ventricular diastolic parameters are consistent with Grade I diastolic dysfunction (impaired relaxation).  2. Right ventricular systolic function is normal. The right ventricular size is normal. There is mildly elevated pulmonary artery systolic pressure. The estimated right ventricular systolic pressure is 84.1 mmHg.  3. Left atrial size was moderately dilated.  4. The mitral valve is abnormal. Mild mitral valve regurgitation.  5. The aortic valve is tricuspid. Aortic valve regurgitation is not visualized.  6. Aortic dilatation noted. There is mild dilatation of the aortic root, measuring 39 mm.  7. The inferior vena cava is dilated in size with <50% respiratory variability, suggesting right atrial pressure of 15 mmHg. Comparison(s): Changes from prior study are noted. 12/15/2015: LVEF 55-60%, normal LA size, grade 2 DD, dilated aortic root to 3.9 cm, RVSP 33 mmHg. FINDINGS  Left Ventricle: Left ventricular ejection fraction, by estimation, is 60 to 65%. The left ventricle has normal function. The left ventricle has no regional wall motion abnormalities. The left ventricular internal cavity size was normal in size. There is  borderline left ventricular hypertrophy. Left ventricular diastolic parameters are consistent with Grade I diastolic dysfunction (impaired relaxation). Indeterminate filling pressures. Right Ventricle: The right ventricular size is normal. No increase in right ventricular wall thickness. Right ventricular systolic function is normal. There is mildly elevated pulmonary artery systolic pressure. The tricuspid regurgitant velocity is 2.62  m/s, and with an assumed right atrial pressure of 15 mmHg, the estimated right ventricular systolic pressure is 32.4 mmHg. Left Atrium: Left atrial  size was moderately dilated. Right Atrium: Right atrial size was normal in size. Pericardium: There is no evidence of pericardial effusion. Mitral Valve: The mitral valve is abnormal. There is mild thickening of the mitral valve leaflet(s). Mild mitral valve regurgitation. Tricuspid Valve: The tricuspid valve is grossly normal. Tricuspid valve regurgitation is trivial. Aortic Valve: The aortic valve is tricuspid. Aortic valve regurgitation is not visualized. Pulmonic Valve: The pulmonic valve was normal in structure. Pulmonic valve regurgitation is not visualized. Aorta: Aortic dilatation noted. There is mild dilatation of the aortic root, measuring 39 mm. Venous: The inferior vena cava is dilated in size with less than 50% respiratory variability, suggesting right atrial pressure of 15 mmHg. IAS/Shunts: No atrial level shunt detected by color flow Doppler.  LEFT VENTRICLE PLAX 2D LVIDd:         4.90 cm  Diastology LVIDs:         3.10 cm  LV e' medial:    8.49 cm/s LV PW:         1.00 cm  LV E/e' medial:  12.7 LV IVS:        0.90 cm  LV e' lateral:   7.72 cm/s LVOT diam:     2.20 cm  LV E/e' lateral: 14.0 LV  SV:         87 LV SV Index:   43 LVOT Area:     3.80 cm  RIGHT VENTRICLE RV S prime:     16.30 cm/s TAPSE (M-mode): 2.3 cm LEFT ATRIUM             Index       RIGHT ATRIUM           Index LA diam:        4.40 cm 2.19 cm/m  RA Area:     17.30 cm LA Vol (A2C):   77.3 ml 38.40 ml/m RA Volume:   44.50 ml  22.10 ml/m LA Vol (A4C):   90.1 ml 44.75 ml/m LA Biplane Vol: 91.5 ml 45.45 ml/m  AORTIC VALVE LVOT Vmax:   115.93 cm/s LVOT Vmean:  75.433 cm/s LVOT VTI:    0.229 m  AORTA Ao Root diam: 3.90 cm Ao Asc diam:  3.60 cm MITRAL VALVE                TRICUSPID VALVE MV Area (PHT): 4.21 cm     TR Peak grad:   27.5 mmHg MV Decel Time: 180 msec     TR Vmax:        262.00 cm/s MV E velocity: 108.00 cm/s MV A velocity: 85.90 cm/s   SHUNTS MV E/A ratio:  1.26         Systemic VTI:  0.23 m                              Systemic Diam: 2.20 cm Lyman Bishop MD Electronically signed by Lyman Bishop MD Signature Date/Time: 12/17/2019/3:23:40 PM    Final

## 2020-01-02 ENCOUNTER — Telehealth: Payer: Self-pay

## 2020-01-02 ENCOUNTER — Encounter: Payer: Self-pay | Admitting: Hematology and Oncology

## 2020-01-02 ENCOUNTER — Telehealth: Payer: Self-pay | Admitting: *Deleted

## 2020-01-02 LAB — IGG: IgG (Immunoglobin G), Serum: 269 mg/dL — ABNORMAL LOW (ref 603–1613)

## 2020-01-02 NOTE — Telephone Encounter (Signed)
Called and reviewed upcoming appts with him and his wife. They verbalized understanding.

## 2020-01-02 NOTE — Telephone Encounter (Signed)
Called and given below message. He verbalized understanding. 

## 2020-01-02 NOTE — Telephone Encounter (Signed)
-----   Message from [name], MD sent at 01/02/2020  9:39 AM EST ----- Regarding: labs results and appt His IgG level and B12 are low I am planning to give him both IVIG and B12 injection, please let him know I sent him to scheduling but nothing is scheduled. Can you find out why?

## 2020-01-02 NOTE — Telephone Encounter (Signed)
The patient has had recent, extended hospitalizations for SIRS/sepsis/pneumonia. 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 He was scheduled to start this week.

## 2020-01-02 NOTE — Telephone Encounter (Signed)
This information has been provided to [facility] who will contact the patient for rescheduling.

## 2020-01-02 NOTE — Telephone Encounter (Signed)
[facility] contacted the patient's wife who informed them that he will be getting his IVIG infusions at his oncology office.

## 2020-01-02 NOTE — Telephone Encounter (Signed)
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) He tolerated Calquence well but his treatment course is complicated by recurrent infection and hospitalizations We will continue treatment as scheduled I recommend IVIG treatment along with aggressive supportive care

## 2020-01-03 ENCOUNTER — Ambulatory Visit: Payer: Medicare Other | Admitting: Pulmonary Disease

## 2020-01-06 NOTE — Progress Notes (Signed)
I have reviewed and agreed above plan. 

## 2020-01-07 ENCOUNTER — Other Ambulatory Visit: Payer: Self-pay | Admitting: Internal Medicine

## 2020-01-07 ENCOUNTER — Other Ambulatory Visit: Payer: Self-pay

## 2020-01-07 ENCOUNTER — Encounter: Payer: Self-pay | Admitting: Internal Medicine

## 2020-01-07 ENCOUNTER — Ambulatory Visit (INDEPENDENT_AMBULATORY_CARE_PROVIDER_SITE_OTHER): Payer: Medicare Other | Admitting: Internal Medicine

## 2020-01-07 VITALS — BP 118/64 | HR 83 | Ht 72.0 in | Wt 162.4 lb

## 2020-01-07 DIAGNOSIS — C9110 Chronic lymphocytic leukemia of B-cell type not having achieved remission: Secondary | ICD-10-CM | POA: Diagnosis not present

## 2020-01-07 DIAGNOSIS — J441 Chronic obstructive pulmonary disease with (acute) exacerbation: Secondary | ICD-10-CM

## 2020-01-07 DIAGNOSIS — Z87891 Personal history of nicotine dependence: Secondary | ICD-10-CM

## 2020-01-07 DIAGNOSIS — D801 Nonfamilial hypogammaglobulinemia: Secondary | ICD-10-CM

## 2020-01-07 DIAGNOSIS — R053 Chronic cough: Secondary | ICD-10-CM

## 2020-01-07 NOTE — Progress Notes (Signed)
Ryan Copeland    [id number]    Oct 14, 1950  Primary Care Physician:Kaplan, Baldemar Friday., PA-C Date of Appointment: 01/07/2020 Established Patient Visit  Chief complaint:   Chief Complaint  Patient presents with   Follow-up    Pt states he is about the same since last visit. States he still has SOB,  has a productive cough with yellow-green phlegm. Denies any complaints of wheezing or fever.    HPI: Ryan Copeland is a 69 y.o.  former smoker quit in 2018 (44-pack-year history).  Past medical history significant for COPD mixed type, chronic sinusitis, community-acquired bacterial pneumonia, hypertension, MI, chronic diastolic heart failure, GERD, chronic inflammatory demyelinating polyneuropathy, diabetes mellitus, small cell B-cell lymphoma.    Follows with oncology for small cell B-cell lymphoma.  Interval Updates: 01/07/2020 Presents today for follow-up community-acquired pneumonia. He was diagnosed with Covid-19 in September 2021. Since last visit he was admitted on 12/05/19 for community acquired bacterial pneumonia/spesis and urinary retention. Failed outpatient treatment with Levaquin and azithromycin.  Treated with IV Vancomycin and Zosyn. Transitioned to oral Augmentin, to be completed outpatient. He was admitted again on 12/15/19 for septic shock, he was admitted to ICU service. Urine cultures showed resistant E.Coli, BC remained negative. Treated with 5 day course of cefepime. He was started on midodrine for hypotension. Following with oncology for CLL/pancytopenia. He saw them on 01/01/20, recommended to start monthly IVIG treatment to reduce risk of severe infection and hospitalization. He has previously been on monthly IVIG and did very well on this. IgG most recently was around 200.  He was seen by PCP yesterday for hospital follow-up. Home blood pressure readings have been around 100/60. He is wearing compression stockings and taking midodrine as prescribed. He has a  follow-up with cardiology next week for this.  CT Chest showed persistent left lower lobe cosolidation. He was started on another course of Levaquin on 01/02/20 and prescribed a medrol dose pack as well.   He is also being seen by urology with urinary retention and requiring indwelling foley catheter - there are plans for surgery.  I have reviewed the patient's family social and past medical history and updated as appropriate.   Past Medical History:  Diagnosis Date   Anxiety 01/30/2014   Back injury    lower disc   CAD (coronary artery disease)    CLL (chronic lymphocytic leukemia) ([place]) 03/18/2011   COPD (chronic obstructive pulmonary disease) (HCC)    Diabetes mellitus (HCC)    Type 2    ECRB (extensor carpi radialis brevis) tenosynovitis    GERD (gastroesophageal reflux disease)    takes Nexium if needed   Hyperlipidemia    Lateral epicondylitis of left elbow    MI, acute, non ST segment elevation ([place]) 06/28/2009   with stenting of the LAD   Neuromuscular disorder ([place])    peripheral neuropathy   PVD (peripheral vascular disease) ([place])    Tobacco abuse     Past Surgical History:  Procedure Laterality Date   ADENOIDECTOMY  1955   CARDIAC CATHETERIZATION     CARDIAC CATHETERIZATION N/A 09/26/2014   Procedure: Left Heart Cath and Coronary Angiography;  Surgeon: Peter M Martinique, MD;  Location: [place] [facility];  Service: Cardiovascular;  Laterality: N/A;   carpel tunnel release Left 04-1989   carpel tunnel release  Right 01-1989   CHOLECYSTECTOMY  2007   CORONARY STENT PLACEMENT  May  2011   femoral stents     IR CV LINE INJECTION  08/18/2017   IR CV LINE INJECTION  09/01/2017   IR CV LINE INJECTION  02/02/2018   LATERAL EPICONDYLE RELEASE Left 02/12/2014   Procedure: LEFT ELBOW DEBRIDEMENT WITH TENDON REPAIR ;  Surgeon: Lorn Junes, MD;  Location: [facility];  Service: Orthopedics;  Laterality: Left;   LEFT CAI STENT/PTA AND POPLITEAL ARTERY/TIBIAL  THROMBECTOMY      LEFT HEART CATHETERIZATION WITH CORONARY ANGIOGRAM N/A 08/26/2011   Procedure: LEFT HEART CATHETERIZATION WITH CORONARY ANGIOGRAM;  Surgeon: Peter M Martinique, MD;  Location: [facility];  Service: Cardiovascular;  Laterality: N/A;   PERIPHERAL VASCULAR CATHETERIZATION N/A 01/01/2015   Procedure: Abdominal Aortogram;  Surgeon: Conrad Bessemer Bend, MD;  Location: [place] [facility];  Service: Cardiovascular;  Laterality: N/A;   TARSAL TUNNEL RELEASE Bilateral 08-2007    Family History  Problem Relation Age of Onset   Lung cancer Mother 71   Cancer Mother        lung   Heart failure Father 2   Heart disease Father     Social History   Occupational History    Employer: [employer]    Comment: [employer]  Tobacco Use   Smoking status: Former Smoker    Packs/day: 1.00    Years: 44.00    Pack years: 44.00    Types: Cigarettes    Quit date: 04/23/2016    Years since quitting: 3.7   Smokeless tobacco: Former Counsell[employer] Use: Never used  Substance and Sexual Activity   Alcohol use: No    Alcohol/week: 0.0 standard drinks    Comment:  drinks non-alcoholic beer   Drug use: No   Sexual activity: Yes     Physical Exam: Blood pressure 118/64, pulse 83, height 6' (1.829 m), weight 162 lb 6.4 oz (73.7 kg), SpO2 97 %.  Gen:      No acute distress, chronically ill, no distress Lungs:    Diminished, No increased respiratory effort, symmetric chest wall excursion, clear to auscultation bilaterally, no wheezes or crackles CV:         Regular rate and rhythm; no murmurs, rubs, or gallops.  No pedal edema   Data Reviewed: Imaging: I have personally reviewed the CT Chest from October 2021 which demonstrates mild centrilobular emphysema with peribronchial thickening.   CT Chest from Nov 2021 reviewed personally - worsening left lower lobe opacities. But some improvement in upper lobe opacities.   PFTs: I have personally reviewed the  patient's PFTs and spirometry is normal. No BD response. FeNO 57 ppb.  PFTs:  PFT Results Latest Ref Rng & Units 06/27/2019  FVC-Pre L 3.62  FVC-Predicted Pre % 77  FVC-Post L 3.70  FVC-Predicted Post % 79  Pre FEV1/FVC % % 75  Post FEV1/FCV % % 76  FEV1-Pre L 2.72  FEV1-Predicted Pre % 78  FEV1-Post L 2.82    Labs: Lab Results  Component Value Date   NA 143 01/01/2020   K 3.8 01/01/2020   CL 107 01/01/2020   CO2 29 01/01/2020   Lab Results  Component Value Date   WBC 5.2 01/01/2020   HGB 10.2 (L) 01/01/2020   HCT 32.3 (L) 01/01/2020   MCV 104.2 (H) 01/01/2020   PLT 66 (L) 01/01/2020   Immunization status: Immunization History  Administered Date(s) Administered   Fluad Quad(high Dose 65+) 11/23/2018   Influenza,inj,Quad PF,6+ Mos 11/13/2015, 11/03/2016  Influenza-Unspecified 11/12/2013, 12/04/2014, 11/08/2017   PFIZER SARS-COV-2 Vaccination 10/02/2019, 10/23/2019   Pneumococcal Conjugate-13 01/02/2015   Pneumococcal Polysaccharide-23 06/26/2013, 05/16/2017    Assessment:  Chronic CLL - on Acalabrutinib Centrilobular Emphysema  Non-resolving pneumonia  Hypogammaglobulinemia History of Tobacco Use Disorder Seasonal allergic rhinitis  Plan/Recommendations: Concern for non-resolving or opportunitistic infection.  Will obtain sputum culture including AFB. Fine to finish course of levaquin. Hold off on steroids for his back spasm until cultures are back. Concern that fungal infection would worsen with steroids. Will plan for repeat CT Chest in 6-8 weeks and I will contact him with results of sputum cultures.   Return to Care: Return in about 8 weeks (around 03/03/2020).   Lenice Llamas, MD [facility] [place]

## 2020-01-07 NOTE — Patient Instructions (Signed)
The patient should have follow up scheduled with myself in 6-8 weeks.   Prior to next visit patient should have: CT scan. Drop off sputum cultures to lab  I will contact you with results of sputum culture and changes in antibiotics if needed.  I Will talk with your oncologist about IVIG as well

## 2020-01-08 ENCOUNTER — Other Ambulatory Visit: Payer: Self-pay | Admitting: Hematology and Oncology

## 2020-01-08 DIAGNOSIS — C9110 Chronic lymphocytic leukemia of B-cell type not having achieved remission: Secondary | ICD-10-CM

## 2020-01-10 NOTE — Telephone Encounter (Signed)
ND please advise on refill of benzonatate Pt was last seen on 01/07/2020 Last refill was on 9/30 with 2 refills.  Thanks

## 2020-01-11 ENCOUNTER — Other Ambulatory Visit: Payer: Self-pay

## 2020-01-11 ENCOUNTER — Inpatient Hospital Stay: Payer: Medicare Other

## 2020-01-11 VITALS — BP 104/56 | HR 62 | Temp 98.6°F | Resp 16

## 2020-01-11 DIAGNOSIS — Z23 Encounter for immunization: Secondary | ICD-10-CM

## 2020-01-11 DIAGNOSIS — D801 Nonfamilial hypogammaglobulinemia: Secondary | ICD-10-CM | POA: Diagnosis not present

## 2020-01-11 DIAGNOSIS — C8308 Small cell B-cell lymphoma, lymph nodes of multiple sites: Secondary | ICD-10-CM

## 2020-01-11 DIAGNOSIS — Z95828 Presence of other vascular implants and grafts: Secondary | ICD-10-CM

## 2020-01-11 MED ORDER — DIPHENHYDRAMINE HCL 25 MG PO CAPS
ORAL_CAPSULE | ORAL | Status: AC
  Filled 2020-01-11: qty 1

## 2020-01-11 MED ORDER — EPINEPHRINE 0.3 MG/0.3ML IJ SOAJ: 0.3000 mg | Freq: Once | INTRAMUSCULAR | Status: DC | PRN

## 2020-01-11 MED ORDER — FAMOTIDINE IN NACL 20-0.9 MG/50ML-% IV SOLN: 20.0000 mg | Freq: Once | INTRAVENOUS | Status: DC | PRN

## 2020-01-11 MED ORDER — CYANOCOBALAMIN 1000 MCG/ML IJ SOLN
INTRAMUSCULAR | Status: AC
  Filled 2020-01-11: qty 1

## 2020-01-11 MED ORDER — INFLUENZA VAC A&B SA ADJ QUAD 0.5 ML IM PRSY
PREFILLED_SYRINGE | INTRAMUSCULAR | Status: AC
  Filled 2020-01-11: qty 0.5

## 2020-01-11 MED ORDER — ALBUTEROL SULFATE (2.5 MG/3ML) 0.083% IN NEBU
2.5000 mg | INHALATION_SOLUTION | Freq: Once | RESPIRATORY_TRACT | Status: DC | PRN
  Filled 2020-01-11: qty 3

## 2020-01-11 MED ORDER — FAMOTIDINE IN NACL 20-0.9 MG/50ML-% IV SOLN
20.0000 mg | Freq: Once | INTRAVENOUS | Status: AC
  Administered 2020-01-11: 20 mg via INTRAVENOUS

## 2020-01-11 MED ORDER — HEPARIN SOD (PORK) LOCK FLUSH 100 UNIT/ML IV SOLN
500.0000 [IU] | Freq: Once | INTRAVENOUS | Status: AC | PRN
  Administered 2020-01-11: 500 [IU]
  Filled 2020-01-11: qty 5

## 2020-01-11 MED ORDER — METHYLPREDNISOLONE SODIUM SUCC 40 MG IJ SOLR
INTRAMUSCULAR | Status: AC
  Filled 2020-01-11: qty 1

## 2020-01-11 MED ORDER — FAMOTIDINE IN NACL 20-0.9 MG/50ML-% IV SOLN
INTRAVENOUS | Status: AC
  Filled 2020-01-11: qty 50

## 2020-01-11 MED ORDER — HEPARIN SOD (PORK) LOCK FLUSH 100 UNIT/ML IV SOLN
500.0000 [IU] | Freq: Once | INTRAVENOUS | Status: DC
  Filled 2020-01-11: qty 5

## 2020-01-11 MED ORDER — ACETAMINOPHEN 325 MG PO TABS
650.0000 mg | ORAL_TABLET | Freq: Once | ORAL | Status: AC
  Administered 2020-01-11: 650 mg via ORAL

## 2020-01-11 MED ORDER — IMMUNE GLOBULIN (HUMAN) 10 GM/100ML IJ SOLN
1.0000 g/kg | Freq: Once | INTRAMUSCULAR | Status: AC
  Administered 2020-01-11: 75 g via INTRAVENOUS
  Filled 2020-01-11: qty 600

## 2020-01-11 MED ORDER — CYANOCOBALAMIN 1000 MCG/ML IJ SOLN
1000.0000 ug | Freq: Once | INTRAMUSCULAR | Status: AC
  Administered 2020-01-11: 1000 ug via INTRAMUSCULAR

## 2020-01-11 MED ORDER — SODIUM CHLORIDE 0.9% FLUSH
10.0000 mL | Freq: Once | INTRAVENOUS | Status: AC | PRN
  Administered 2020-01-11: 10 mL
  Filled 2020-01-11: qty 10

## 2020-01-11 MED ORDER — DEXTROSE 5 % IV SOLN
Freq: Once | INTRAVENOUS | Status: AC
  Filled 2020-01-11: qty 250

## 2020-01-11 MED ORDER — HEPARIN SOD (PORK) LOCK FLUSH 100 UNIT/ML IV SOLN
250.0000 [IU] | Freq: Once | INTRAVENOUS | Status: DC | PRN
  Filled 2020-01-11: qty 5

## 2020-01-11 MED ORDER — DIPHENHYDRAMINE HCL 50 MG/ML IJ SOLN: 50.0000 mg | Freq: Once | INTRAMUSCULAR | Status: DC | PRN

## 2020-01-11 MED ORDER — ACETAMINOPHEN 325 MG PO TABS
ORAL_TABLET | ORAL | Status: AC
  Filled 2020-01-11: qty 2

## 2020-01-11 MED ORDER — ALTEPLASE 2 MG IJ SOLR
2.0000 mg | Freq: Once | INTRAMUSCULAR | Status: DC | PRN
  Filled 2020-01-11: qty 2

## 2020-01-11 MED ORDER — INFLUENZA VAC A&B SA ADJ QUAD 0.5 ML IM PRSY
0.5000 mL | PREFILLED_SYRINGE | Freq: Once | INTRAMUSCULAR | Status: AC
  Administered 2020-01-11: 0.5 mL via INTRAMUSCULAR

## 2020-01-11 MED ORDER — SODIUM CHLORIDE 0.9 % IV SOLN
Freq: Once | INTRAVENOUS | Status: DC | PRN
  Filled 2020-01-11: qty 250

## 2020-01-11 MED ORDER — METHYLPREDNISOLONE SODIUM SUCC 40 MG IJ SOLR
40.0000 mg | Freq: Once | INTRAMUSCULAR | Status: AC
  Administered 2020-01-11: 40 mg via INTRAVENOUS

## 2020-01-11 MED ORDER — SODIUM CHLORIDE 0.9% FLUSH
10.0000 mL | Freq: Once | INTRAVENOUS | Status: DC
  Filled 2020-01-11: qty 10

## 2020-01-11 MED ORDER — SODIUM CHLORIDE 0.9% FLUSH
3.0000 mL | Freq: Once | INTRAVENOUS | Status: DC | PRN
  Filled 2020-01-11: qty 10

## 2020-01-11 MED ORDER — LORATADINE 10 MG PO TABS
ORAL_TABLET | ORAL | Status: AC
  Filled 2020-01-11: qty 1

## 2020-01-11 MED ORDER — METHYLPREDNISOLONE SODIUM SUCC 125 MG IJ SOLR: 125.0000 mg | Freq: Once | INTRAMUSCULAR | Status: DC | PRN

## 2020-01-11 MED ORDER — DIPHENHYDRAMINE HCL 25 MG PO TABS
25.0000 mg | ORAL_TABLET | Freq: Once | ORAL | Status: AC
  Administered 2020-01-11: 25 mg via ORAL
  Filled 2020-01-11: qty 1

## 2020-01-11 MED ORDER — IMMUNE GLOBULIN (HUMAN) 10 GM/100ML IV SOLN: 1.0000 g/kg | Freq: Once | INTRAVENOUS | Status: DC

## 2020-01-11 MED ORDER — LORATADINE 10 MG PO TABS: 10.0000 mg | ORAL_TABLET | Freq: Once | ORAL | Status: DC

## 2020-01-11 NOTE — Progress Notes (Signed)
Will proceed with IVIG 1gm/kg today. Pltc 66.   Hardie Pulley, PharmD, BCPS, BCOP

## 2020-01-11 NOTE — Patient Instructions (Signed)
Immune Globulin Injection What is this medicine? IMMUNE GLOBULIN (im MUNE GLOB yoo lin) helps to prevent or reduce the severity of certain infections in patients who are at risk. This medicine is collected from the pooled blood of many donors. It is used to treat immune system problems, thrombocytopenia, and Kawasaki syndrome. This medicine may be used for other purposes; ask your health care provider or pharmacist if you have questions. COMMON BRAND NAME(S): ASCENIV, Baygam, BIVIGAM, Carimune, Carimune NF, cutaquig, Cuvitru, Flebogamma, Flebogamma DIF, GamaSTAN, GamaSTAN S/D, Gamimune N, Gammagard, Gammagard S/D, Gammaked, Gammaplex, Gammar-P IV, Gamunex, Gamunex-C, Hizentra, Iveegam, Iveegam EN, Octagam, Panglobulin, Panglobulin NF, panzyga, Polygam S/D, Privigen, Sandoglobulin, Venoglobulin-S, Vigam, Vivaglobulin, Xembify What should I tell my health care provider before I take this medicine? They need to know if you have any of these conditions:  diabetes  extremely low or no immune antibodies in the blood  heart disease  history of blood clots  hyperprolinemia  infection in the blood, sepsis  kidney disease  recently received or scheduled to receive a vaccination  an unusual or allergic reaction to human immune globulin, albumin, maltose, sucrose, other medicines, foods, dyes, or preservatives  pregnant or trying to get pregnant  breast-feeding How should I use this medicine? This medicine is for injection into a muscle or infusion into a vein or skin. It is usually given by a health care professional in a hospital or clinic setting. In rare cases, some brands of this medicine might be given at home. You will be taught how to give this medicine. Use exactly as directed. Take your medicine at regular intervals. Do not take your medicine more often than directed. Talk to your pediatrician regarding the use of this medicine in children. While this drug may be prescribed for selected  conditions, precautions do apply. Overdosage: If you think you have taken too much of this medicine contact a poison control center or emergency room at once. NOTE: This medicine is only for you. Do not share this medicine with others. What if I miss a dose? It is important not to miss your dose. Call your doctor or health care professional if you are unable to keep an appointment. If you give yourself the medicine and you miss a dose, take it as soon as you can. If it is almost time for your next dose, take only that dose. Do not take double or extra doses. What may interact with this medicine?  aspirin and aspirin-like medicines  cisplatin  cyclosporine  medicines for infection like acyclovir, adefovir, amphotericin B, bacitracin, cidofovir, foscarnet, ganciclovir, gentamicin, pentamidine, vancomycin  NSAIDS, medicines for pain and inflammation, like ibuprofen or naproxen  pamidronate  vaccines  zoledronic acid This list may not describe all possible interactions. Give your health care provider a list of all the medicines, herbs, non-prescription drugs, or dietary supplements you use. Also tell them if you smoke, drink alcohol, or use illegal drugs. Some items may interact with your medicine. What should I watch for while using this medicine? Your condition will be monitored carefully while you are receiving this medicine. This medicine is made from pooled blood donations of many different people. It may be possible to pass an infection in this medicine. However, the donors are screened for infections and all products are tested for HIV and hepatitis. The medicine is treated to kill most or all bacteria and viruses. Talk to your doctor about the risks and benefits of this medicine. Do not have vaccinations for at least   14 days before, or until at least 3 months after receiving this medicine. What side effects may I notice from receiving this medicine? Side effects that you should report  to your doctor or health care professional as soon as possible:  allergic reactions like skin rash, itching or hives, swelling of the face, lips, or tongue  blue colored lips or skin  breathing problems  chest pain or tightness  fever  signs and symptoms of aseptic meningitis such as stiff neck; sensitivity to light; headache; drowsiness; fever; nausea; vomiting; rash  signs and symptoms of a blood clot such as chest pain; shortness of breath; pain, swelling, or warmth in the leg  signs and symptoms of hemolytic anemia such as fast heartbeat; tiredness; dark yellow or brown urine; or yellowing of the eyes or skin  signs and symptoms of kidney injury like trouble passing urine or change in the amount of urine  sudden weight gain  swelling of the ankles, feet, hands Side effects that usually do not require medical attention (report to your doctor or health care professional if they continue or are bothersome):  diarrhea  flushing  headache  increased sweating  joint pain  muscle cramps  muscle pain  nausea  pain, redness, or irritation at site where injected  tiredness This list may not describe all possible side effects. Call your doctor for medical advice about side effects. You may report side effects to FDA at 1-800-FDA-1088. Where should I keep my medicine? Keep out of the reach of children. This drug is usually given in a hospital or clinic and will not be stored at home. In rare cases, some brands of this medicine may be given at home. If you are using this medicine at home, you will be instructed on how to store this medicine. Throw away any unused medicine after the expiration date on the label. NOTE: This sheet is a summary. It may not cover all possible information. If you have questions about this medicine, talk to your doctor, pharmacist, or health care provider.  2020 Elsevier/Gold Standard (2018-09-05 12:51:14)  

## 2020-01-11 NOTE — Progress Notes (Signed)
Pt discharged to home alert, oriented, ambulatory, VSS

## 2020-01-15 ENCOUNTER — Other Ambulatory Visit: Payer: Self-pay

## 2020-01-15 ENCOUNTER — Ambulatory Visit (INDEPENDENT_AMBULATORY_CARE_PROVIDER_SITE_OTHER): Payer: Medicare Other | Admitting: Nurse Practitioner

## 2020-01-15 ENCOUNTER — Encounter: Payer: Self-pay | Admitting: Nurse Practitioner

## 2020-01-15 VITALS — BP 120/60 | HR 77 | Ht 72.0 in | Wt 162.0 lb

## 2020-01-15 DIAGNOSIS — E7800 Pure hypercholesterolemia, unspecified: Secondary | ICD-10-CM

## 2020-01-15 DIAGNOSIS — I10 Essential (primary) hypertension: Secondary | ICD-10-CM | POA: Diagnosis not present

## 2020-01-15 DIAGNOSIS — I259 Chronic ischemic heart disease, unspecified: Secondary | ICD-10-CM

## 2020-01-15 MED ORDER — MIDODRINE HCL 5 MG PO TABS: 5.0000 mg | ORAL_TABLET | Freq: Three times a day (TID) | ORAL | 3 refills | Status: DC

## 2020-01-15 NOTE — Patient Instructions (Addendum)
After Visit Summary:  We will be checking the following labs today - NONE   Medication Instructions:    Continue with your current medicines.    If you need a refill on your cardiac medications before your next appointment, please call your pharmacy.     Testing/Procedures To Be Arranged:  N/A  Follow-Up:   See me towards late January    At [facility], you and your health needs are our priority.  As part of our continuing mission to provide you with exceptional heart care, we have created designated Provider Care Teams.  These Care Teams include your primary Cardiologist (physician) and Advanced Practice Providers (APPs -  Physician Assistants and Nurse Practitioners) who all work together to provide you with the care you need, when you need it.  Special Instructions:  . Stay safe, wash your hands for at least 20 seconds and wear a mask when needed.  . It was good to talk with you today.    Call the [place] office at [phone] if you have any questions, problems or concerns.

## 2020-01-16 ENCOUNTER — Telehealth: Payer: Self-pay | Admitting: Internal Medicine

## 2020-01-16 NOTE — Telephone Encounter (Signed)
Called and spoke with patient who states he was supposed to provide sputum samples for Dr. Shearon Stalls, but he is no longer spitting/coughing up anything anymore. Will forward to Dr. Shearon Stalls as an Juluis Rainier. Patient has an appointment on 12/14 advised to keep that appointment and is getting CT scan done tomorrow. Nothing further needed at this time.

## 2020-01-16 NOTE — Telephone Encounter (Signed)
Pt states he has CT tomorrow - won't be able 10:30 - 12:30

## 2020-01-16 NOTE — Telephone Encounter (Signed)
ATC x1.  LVM to return call. 

## 2020-01-16 NOTE — Telephone Encounter (Signed)
Pt returning call - CB# [phone]

## 2020-01-17 ENCOUNTER — Ambulatory Visit (INDEPENDENT_AMBULATORY_CARE_PROVIDER_SITE_OTHER): Payer: Medicare Other

## 2020-01-17 ENCOUNTER — Other Ambulatory Visit: Payer: Self-pay

## 2020-01-17 DIAGNOSIS — R918 Other nonspecific abnormal finding of lung field: Secondary | ICD-10-CM | POA: Diagnosis not present

## 2020-01-17 DIAGNOSIS — J441 Chronic obstructive pulmonary disease with (acute) exacerbation: Secondary | ICD-10-CM

## 2020-01-17 DIAGNOSIS — C9110 Chronic lymphocytic leukemia of B-cell type not having achieved remission: Secondary | ICD-10-CM | POA: Diagnosis not present

## 2020-01-22 MED FILL — CALQUENCE 100 MG CAPSULE: 100 | 30 days supply | Qty: 60 | Fill #10

## 2020-01-28 ENCOUNTER — Other Ambulatory Visit: Payer: Self-pay

## 2020-01-28 ENCOUNTER — Other Ambulatory Visit: Payer: Self-pay | Admitting: Hematology and Oncology

## 2020-01-28 ENCOUNTER — Encounter: Payer: Self-pay | Admitting: Internal Medicine

## 2020-01-28 ENCOUNTER — Ambulatory Visit (INDEPENDENT_AMBULATORY_CARE_PROVIDER_SITE_OTHER): Payer: Medicare Other | Admitting: Internal Medicine

## 2020-01-28 VITALS — BP 100/58 | HR 57 | Temp 97.7°F | Ht 72.0 in | Wt 161.1 lb

## 2020-01-28 DIAGNOSIS — J411 Mucopurulent chronic bronchitis: Secondary | ICD-10-CM

## 2020-01-28 DIAGNOSIS — J189 Pneumonia, unspecified organism: Secondary | ICD-10-CM | POA: Diagnosis not present

## 2020-01-28 DIAGNOSIS — C9110 Chronic lymphocytic leukemia of B-cell type not having achieved remission: Secondary | ICD-10-CM

## 2020-01-28 DIAGNOSIS — D801 Nonfamilial hypogammaglobulinemia: Secondary | ICD-10-CM | POA: Diagnosis not present

## 2020-01-28 DIAGNOSIS — Z01811 Encounter for preprocedural respiratory examination: Secondary | ICD-10-CM

## 2020-01-28 NOTE — Progress Notes (Signed)
Ryan Copeland    [id number]    June 02, 1950  Primary Care Physician:Kaplan, Baldemar Friday., PA-C Date of Appointment: 01/28/2020 Established Patient Visit  Chief complaint:   Chief Complaint  Patient presents with  . Follow-up    Discuss CT results.     HPI: Ryan Copeland is a 69 y.o.  former smoker quit in 2018 (44-pack-year history).  Past medical history significant for COPD mixed type, chronic sinusitis, community-acquired bacterial pneumonia, hypertension, MI, chronic diastolic heart failure, GERD, chronic inflammatory demyelinating polyneuropathy, diabetes mellitus, small cell B-cell lymphoma.    Follows with oncology for small cell B-cell lymphoma. Also has hypogammaglobulinemia and has previously been on monthly IVIG infusions.  Interval Updates: Had hospital stay in October 2021 with CAP which failed outpatient therapy. Here for follow up after CT Scan to ensure resolving pneumonia. He had his CT scan sooner because he was unable to bring up sputum. Shows improving LLL pneumonia.   His cough is mostly triggered by laying down at night. It wakes him up in his sleep. He is actually not feeling so bad during the day. Drainage is controlled at night.   He states feeling "rotten."  I have reviewed the patient's family social and past medical history and updated as appropriate.   Past Medical History:  Diagnosis Date  . Anxiety 01/30/2014  . Back injury    lower disc  . CAD (coronary artery disease)   . CLL (chronic lymphocytic leukemia) ([place]) 03/18/2011  . COPD (chronic obstructive pulmonary disease) ([place])   . Diabetes mellitus ([place])    Type 2   . ECRB (extensor carpi radialis brevis) tenosynovitis   . GERD (gastroesophageal reflux disease)    takes Nexium if needed  . Hyperlipidemia   . Lateral epicondylitis of left elbow   . MI, acute, non ST segment elevation ([place]) 06/28/2009   with stenting of the LAD  . Neuromuscular disorder ([place])    peripheral neuropathy   . PVD (peripheral vascular disease) ([place])   . Tobacco abuse     Past Surgical History:  Procedure Laterality Date  . ADENOIDECTOMY  1955  . CARDIAC CATHETERIZATION    . CARDIAC CATHETERIZATION N/A 09/26/2014   Procedure: Left Heart Cath and Coronary Angiography;  Surgeon: Peter M Martinique, MD;  Location: [facility];  Service: Cardiovascular;  Laterality: N/A;  . carpel tunnel release Left 04-1989  . carpel tunnel release  Right 01-1989  . CHOLECYSTECTOMY  2007  . CORONARY STENT PLACEMENT  May 2011  . femoral stents    . IR CV LINE INJECTION  08/18/2017  . IR CV LINE INJECTION  09/01/2017  . IR CV LINE INJECTION  02/02/2018  . LATERAL EPICONDYLE RELEASE Left 02/12/2014   Procedure: LEFT ELBOW DEBRIDEMENT WITH TENDON REPAIR ;  Surgeon: Lorn Junes, MD;  Location: [facility];  Service: Orthopedics;  Laterality: Left;  . LEFT CAI STENT/PTA AND POPLITEAL ARTERY/TIBIAL THROMBECTOMY     . LEFT HEART CATHETERIZATION WITH CORONARY ANGIOGRAM N/A 08/26/2011   Procedure: LEFT HEART CATHETERIZATION WITH CORONARY ANGIOGRAM;  Surgeon: Peter M Martinique, MD;  Location: [facility];  Service: Cardiovascular;  Laterality: N/A;  . PERIPHERAL VASCULAR CATHETERIZATION N/A 01/01/2015   Procedure: Abdominal Aortogram;  Surgeon: Conrad Lewisburg, MD;  Location: [facility];  Service: Cardiovascular;  Laterality: N/A;  . TARSAL TUNNEL RELEASE Bilateral 08-2007    Family History  Problem Relation Age of Onset  . Lung cancer Mother 15  . Cancer Mother        lung  . Heart failure Father 37  . Heart disease Father     Social History   Occupational History    Employer: [employer]    Comment: [employer]  Tobacco Use  . Smoking status: Former Smoker    Packs/day: 1.00    Years: 44.00    Pack years: 44.00    Types: Cigarettes    Quit date: 04/23/2016    Years since quitting: 3.7  . Smokeless tobacco: Former Network engineer  . Vaping Use: Never used  Substance and Sexual Activity   . Alcohol use: No    Alcohol/week: 0.0 standard drinks    Comment:  drinks non-alcoholic beer  . Drug use: No  . Sexual activity: Yes     Physical Exam: Blood pressure (!) 100/58, pulse (!) 57, temperature 97.7 F (36.5 C), temperature source Tympanic, height 6' (1.829 m), weight 161 lb 2 oz (73.1 kg), SpO2 98 %.  Gen:      No acute distress, chronically ill, no distress Lungs:    Diminished, No increased respiratory effort, symmetric chest wall excursion, clear to auscultation bilaterally, no wheezes or crackles CV:         Regular rate and rhythm; no murmurs, rubs, or gallops.  No pedal edema   Data Reviewed: Imaging: I have personally reviewed the CT Chest from October 2021 which demonstrates mild centrilobular emphysema with peribronchial thickening.   CT Chest from Nov 2021 reviewed personally - worsening left lower lobe opacities. But some improvement in upper lobe opacities.   PFTs: I have personally reviewed the patient's PFTs and spirometry is normal. No BD response. FeNO 57 ppb.  PFTs:  PFT Results Latest Ref Rng & Units 06/27/2019  FVC-Pre L 3.62  FVC-Predicted Pre % 77  FVC-Post L 3.70  FVC-Predicted Post % 79  Pre FEV1/FVC % % 75  Post FEV1/FCV % % 76  FEV1-Pre L 2.72  FEV1-Predicted Pre % 78  FEV1-Post L 2.82    Labs: Lab Results  Component Value Date   NA 143 01/01/2020   K 3.8 01/01/2020   CL 107 01/01/2020   CO2 29 01/01/2020   Lab Results  Component Value Date   WBC 5.2 01/01/2020   HGB 10.2 (L) 01/01/2020   HCT 32.3 (L) 01/01/2020   MCV 104.2 (H) 01/01/2020   PLT 66 (L) 01/01/2020   Immunization status: Immunization History  Administered Date(s) Administered  . Fluad Quad(high Dose 65+) 11/23/2018, 01/11/2020  . Influenza Split 11/12/2013, 12/04/2014, 11/13/2015, 11/03/2016, 11/08/2017  . Influenza, High Dose Seasonal PF 11/23/2018  . Influenza,inj,Quad PF,6+ Mos 11/13/2015, 11/03/2016  . Influenza,inj,quad, With Preservative  11/13/2015  . Influenza-Unspecified 11/12/2013, 12/04/2014, 11/08/2017  . PFIZER SARS-COV-2 Vaccination 10/02/2019, 10/23/2019  . Pneumococcal Conjugate-13 06/26/2013, 01/02/2015  . Pneumococcal Polysaccharide-23 06/26/2013, 05/16/2017  . Tdap 02/11/2009    Assessment:  Chronic CLL - on Acalabrutinib Centrilobular Emphysema  CAP - failed outpatient therapy. Improving.  Hypogammaglobulinemia receiving monthly IVIG infusions History of Tobacco Use Disorder Seasonal allergic rhinitis Pre-operative pulmonary evaluation   Plan/Recommendations: Pneumonia is improving radiographically from pneumonia standpoint. Unfortunately he got his CT scan a bit earlier than I was expecting. Still has nocturnal cough which may be secondary to GERD - limited treatment options due to unable to take a PPI. Recommended wedge pillow again.  Will repeat CT Scan in February (2 months from  now.)  Continue inhaler therapies. Will add flutter valve to help with expectoration. He will bring sputum if he can.   Pre-operative evaluation for urologic surgery as below - if he had the surgery today.  I suspect as he is further out from his recent pneumonia this risk would continue to drop.    ASSESSMENT AND RECOMMENDATIONS:  Preoperative Risk Calculation: The features of this patient's history that contribute to the pulmonary risk assessment include: Age, COPD, General anesthesia. Infection in November 2021.   This patient has an intermediate risk of post-operative pulmonary complications by ARISCAT Index.  The absolute assessment of risk/benefit of the procedure is deferred to the primary team's evaluation.  - Patient's Estimated risk of postoperative respiratory failure is 31 based on the ARISCAT Index.   0 to 25 points: Low risk: 1.0% pulmonary complication rate  26 to 44 points: Intermediate risk: 17.5% pulmonary complication rate  45 to 123 points: High risk: 10.2% pulmonary complication rate  Postoperative  respiratory failure (PRF) is considered as failure to wean from mechanical ventilation within 48 hours of surgery or unplanned intubation/reintubation postoperatively. The validated risk calculator provides a risk estimate of PRF and is anticipated to aid in surgical decision-making and informed patient consent.  However risk can be accepted given the potential benefit of this intervention and it is not prohibitive.  RECOMMENDATIONS:  In order to minimize the risk of complications and optimize pulmonary status, we recommend the following: - Encourage aggressive incentive spirometry hourly both peri-operatively and post-operatively as tolerated  - Early ambulation and physical therapy as tolerated post-operatively - Adequate pain control especially in the setting of abdominal and thoracic surgery - Bronchodilators as needed for wheezing or shortness of breath - Oral steroids only if the patient appears to have component of COPD exacerbation, otherwise no routine utilization needed - Ideally we recommend smoking cessation for >4 weeks before any intervention - Intraoperatively keep OR time to the shortest as possible   ARISCAT: Mazo et al. Anesthesiology 2014; [phone]   I spent 45 minutes in the care of this patient today including pre-charting, chart review, review of results, face-to-face care, coordination of care and communication with consultants etc.).   Return to Care: Return in about 2 months (around 03/30/2020).   Lenice Llamas, MD Pulmonary and [place], [place]

## 2020-01-28 NOTE — Patient Instructions (Signed)
The patient should have follow up scheduled with myself in 2 months.   Prior to next visit patient should have: CT scan moved to February   Ok to proceed with your surgery for bladder.  Collect sputum if you start developing.

## 2020-01-31 ENCOUNTER — Other Ambulatory Visit: Payer: Self-pay | Admitting: Hematology and Oncology

## 2020-02-03 NOTE — Telephone Encounter (Signed)
Patient sent email this morning  Dr. Shearon Stalls: Do you think Medicare will pay for the wedge pillow you recommended as Durable Medical Equipment?  They're pretty expensive, and I've met all of my Medicare deductibles for the year, so there wouldn't be any cost for me.  If you don't know, could you have someone check?  Thanks,  Ryan Copeland ([name]) Georgina Peer  I let him know he would have to contact medicare and see what his specific plan covers and does not cover. I also let him know I would send this to Dr. Shearon Stalls as an FYI to see if she has any further recommendations.   Dr. Shearon Stalls if so please advise

## 2020-02-04 NOTE — Telephone Encounter (Signed)
I'm sorry I don't know the answer to that - but I know if he had a flexible savings account it would be covered as a medical expense. He should call his insurance company. The cheapest I have seen these is about $30 at [employer], [employer], [employer].

## 2020-02-06 ENCOUNTER — Inpatient Hospital Stay (HOSPITAL_BASED_OUTPATIENT_CLINIC_OR_DEPARTMENT_OTHER): Payer: Medicare Other | Admitting: Hematology and Oncology

## 2020-02-06 ENCOUNTER — Inpatient Hospital Stay: Payer: Medicare Other

## 2020-02-06 ENCOUNTER — Inpatient Hospital Stay: Payer: Medicare Other | Attending: Hematology and Oncology

## 2020-02-06 ENCOUNTER — Encounter: Payer: Self-pay | Admitting: Hematology and Oncology

## 2020-02-06 ENCOUNTER — Other Ambulatory Visit: Payer: Self-pay

## 2020-02-06 VITALS — BP 90/50 | HR 67 | Temp 98.0°F | Resp 18

## 2020-02-06 DIAGNOSIS — I2510 Atherosclerotic heart disease of native coronary artery without angina pectoris: Secondary | ICD-10-CM | POA: Diagnosis not present

## 2020-02-06 DIAGNOSIS — I259 Chronic ischemic heart disease, unspecified: Secondary | ICD-10-CM

## 2020-02-06 DIAGNOSIS — C8308 Small cell B-cell lymphoma, lymph nodes of multiple sites: Secondary | ICD-10-CM

## 2020-02-06 DIAGNOSIS — Z79899 Other long term (current) drug therapy: Secondary | ICD-10-CM | POA: Insufficient documentation

## 2020-02-06 DIAGNOSIS — E538 Deficiency of other specified B group vitamins: Secondary | ICD-10-CM

## 2020-02-06 DIAGNOSIS — D61818 Other pancytopenia: Secondary | ICD-10-CM | POA: Insufficient documentation

## 2020-02-06 DIAGNOSIS — D801 Nonfamilial hypogammaglobulinemia: Secondary | ICD-10-CM

## 2020-02-06 DIAGNOSIS — Z95828 Presence of other vascular implants and grafts: Secondary | ICD-10-CM

## 2020-02-06 DIAGNOSIS — R161 Splenomegaly, not elsewhere classified: Secondary | ICD-10-CM | POA: Diagnosis not present

## 2020-02-06 DIAGNOSIS — J449 Chronic obstructive pulmonary disease, unspecified: Secondary | ICD-10-CM

## 2020-02-06 LAB — COMPREHENSIVE METABOLIC PANEL
ALT: 10 U/L (ref 0–44)
AST: 13 U/L — ABNORMAL LOW (ref 15–41)
Albumin: 3.2 g/dL — ABNORMAL LOW (ref 3.5–5.0)
Alkaline Phosphatase: 88 U/L (ref 38–126)
Anion gap: 4 — ABNORMAL LOW (ref 5–15)
BUN: 10 mg/dL (ref 8–23)
CO2: 27 mmol/L (ref 22–32)
Calcium: 8.9 mg/dL (ref 8.9–10.3)
Chloride: 108 mmol/L (ref 98–111)
Creatinine, Ser: 0.67 mg/dL (ref 0.61–1.24)
GFR, Estimated: >60 mL/min (ref >60)
Glucose, Bld: 109 mg/dL — ABNORMAL HIGH (ref 70–99)
Potassium: 4.3 mmol/L (ref 3.5–5.1)
Sodium: 139 mmol/L (ref 135–145)
Total Bilirubin: 0.5 mg/dL (ref 0.3–1.2)
Total Protein: 5.8 g/dL — ABNORMAL LOW (ref 6.5–8.1)

## 2020-02-06 LAB — CBC WITH DIFFERENTIAL/PLATELET
Abs Immature Granulocytes: 0.05 10*3/uL (ref 0.00–0.07)
Basophils Absolute: 0 10*3/uL (ref 0.0–0.1)
Basophils Relative: 0 %
Eosinophils Absolute: 0.1 10*3/uL (ref 0.0–0.5)
Eosinophils Relative: 1 %
HCT: 36.3 % — ABNORMAL LOW (ref 39.0–52.0)
Hemoglobin: 11.5 g/dL — ABNORMAL LOW (ref 13.0–17.0)
Immature Granulocytes: 1 %
Lymphocytes Relative: 25 %
Lymphs Abs: 1.8 10*3/uL (ref 0.7–4.0)
MCH: 33.1 pg (ref 26.0–34.0)
MCHC: 31.7 g/dL (ref 30.0–36.0)
MCV: 104.6 fL — ABNORMAL HIGH (ref 80.0–100.0)
Monocytes Absolute: 0.3 10*3/uL (ref 0.1–1.0)
Monocytes Relative: 4 %
Neutro Abs: 5 10*3/uL (ref 1.7–7.7)
Neutrophils Relative %: 69 %
Platelets: 92 10*3/uL — ABNORMAL LOW (ref 150–400)
RBC: 3.47 MIL/uL — ABNORMAL LOW (ref 4.22–5.81)
RDW: 17.6 % — ABNORMAL HIGH (ref 11.5–15.5)
WBC: 7.2 10*3/uL (ref 4.0–10.5)
nRBC: 0 % (ref 0.0–0.2)

## 2020-02-06 MED ORDER — SODIUM CHLORIDE 0.9% FLUSH
10.0000 mL | Freq: Once | INTRAVENOUS | Status: AC
  Administered 2020-02-06: 10 mL
  Filled 2020-02-06: qty 10

## 2020-02-06 MED ORDER — HEPARIN SOD (PORK) LOCK FLUSH 100 UNIT/ML IV SOLN
500.0000 [IU] | Freq: Once | INTRAVENOUS | Status: AC
  Administered 2020-02-06: 500 [IU]
  Filled 2020-02-06: qty 5

## 2020-02-06 MED ORDER — CYANOCOBALAMIN 1000 MCG/ML IJ SOLN
INTRAMUSCULAR | Status: AC
  Filled 2020-02-06: qty 1

## 2020-02-06 MED ORDER — DIPHENHYDRAMINE HCL 25 MG PO TABS
25.0000 mg | ORAL_TABLET | Freq: Once | ORAL | Status: AC
  Administered 2020-02-06: 10:00:00 25 mg via ORAL
  Filled 2020-02-06: qty 1

## 2020-02-06 MED ORDER — METHYLPREDNISOLONE SODIUM SUCC 40 MG IJ SOLR
40.0000 mg | Freq: Once | INTRAMUSCULAR | Status: AC
  Administered 2020-02-06: 10:00:00 40 mg via INTRAVENOUS

## 2020-02-06 MED ORDER — METHYLPREDNISOLONE SODIUM SUCC 40 MG IJ SOLR
INTRAMUSCULAR | Status: AC
  Filled 2020-02-06: qty 1

## 2020-02-06 MED ORDER — CYANOCOBALAMIN 1000 MCG/ML IJ SOLN
1000.0000 ug | Freq: Once | INTRAMUSCULAR | Status: AC
  Administered 2020-02-06: 12:00:00 1000 ug via INTRAMUSCULAR

## 2020-02-06 MED ORDER — DEXTROSE 5 % IV SOLN
INTRAVENOUS | Status: DC
  Filled 2020-02-06: qty 250

## 2020-02-06 MED ORDER — DIPHENHYDRAMINE HCL 25 MG PO CAPS
ORAL_CAPSULE | ORAL | Status: AC
  Filled 2020-02-06: qty 1

## 2020-02-06 MED ORDER — SODIUM CHLORIDE 0.9% FLUSH
10.0000 mL | Freq: Once | INTRAVENOUS | Status: AC
  Administered 2020-02-06: 09:00:00 10 mL
  Filled 2020-02-06: qty 10

## 2020-02-06 MED ORDER — ACETAMINOPHEN 325 MG PO TABS
ORAL_TABLET | ORAL | Status: AC
  Filled 2020-02-06: qty 2

## 2020-02-06 MED ORDER — IMMUNE GLOBULIN (HUMAN) 10 GM/100ML IJ SOLN
0.5000 g/kg | Freq: Once | INTRAMUSCULAR | Status: AC
  Administered 2020-02-06: 11:00:00 35 g via INTRAVENOUS
  Filled 2020-02-06: qty 200

## 2020-02-06 MED ORDER — ACETAMINOPHEN 325 MG PO TABS
650.0000 mg | ORAL_TABLET | Freq: Once | ORAL | Status: AC
  Administered 2020-02-06: 10:00:00 650 mg via ORAL

## 2020-02-06 NOTE — Progress Notes (Signed)
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 [facility]) He tolerated Calquence well but his treatment course is complicated by recurrent infection and hospitalizations We will continue treatment as scheduled I recommend IVIG treatment along with aggressive supportive care  Vitamin B12 deficiency He is noted to have vitamin B12 deficiency I will start him on vitamin B12 replacement therapy monthly  Pancytopenia, acquired ([place]) He has multifactorial pancytopenia He is known to have splenomegaly 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   All questions were answered. The patient knows to call the clinic with any problems, questions or concerns. The total time spent in the appointment was 25 minutes encounter with patients including review of chart and various tests results, discussions about plan of care and coordination of care plan   [name], MD 02/06/2020 9:23 AM  INTERVAL HISTORY: Please see below for problem oriented charting. He returns for further follow-up and IVIG treatment His breathing has improved Appetite is gradually improving The patient denies any recent signs or symptoms of bleeding such as  spontaneous epistaxis, hematuria or hematochezia. 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  No evidence of aortic dissection or other acute process in the chest. 2.  Centrilobular emphysema with a 5 mm right lung nodule. Given the concurrent centrilobular emphysema, follow-up chest CT at 6 -12 months is recommended.  3.  Coronary artery atherosclerosis which is age advanced. 4.  Prominent thoracic lymph nodes.  These can be reevaluated at follow-up.   05/06/2010 Imaging   1.  Multiple small periaortic lymph nodes consistent with the patient's history of the chronic lymphocytic leukemia. 2.  No evidence of solid organ involvement   11/03/2011 Imaging   1.  Interval progression of abdominal and pelvic adenopathy. 2.  Progression of splenomegaly.  The spleen now measures 23 cm in length   11/18/2011 Bone Marrow Biopsy   Bone Marrow, Aspirate,Biopsy, and Clot, right iliac bone - HYPERCELLULAR BONE MARROW WITH EXTENSIVE INVOLVEMENT BY CHRONIC LYMPHOCYTIC LEUKEMIA. PERIPHERAL BLOOD: - CHRONIC LYMPHOCYTIC LEUKEMIA   03/08/2012 Imaging   1.  Progressive increase in retroperitoneal, iliac, and inguinal lymphadenopathy. 2.  Interval increase in massive splenomegaly.    06/01/2012 Procedure   Placement of single lumen port a cath via right internal jugular vein.  The catheter tip lies at the cavoatrial junction.  A power injectable port a cath was placed and is ready for immediate use   06/20/2012 - 11/23/2012 Chemotherapy   He received FCR x 6 cycles   12/19/2012 Imaging   Left common iliac  stent. Abdominal vasculature remains patent. Improving supraclavicular and axillary lymphadenopathy. Residual right subpectoral nodes measure up to 10 mm short axis. Improving retroperitoneal  lymphadenopathy, measuring up to 16 mm short axis. Improving splenomegaly, measuring 18.7 cm.    01/20/2016 Imaging   1. Stable exam.  No new or progressive findings. 2. No CT findings to explain odynophagia   09/02/2016 Pathology Results   The findings are consistent with involvement by previously known chronic lymphocytic leukemia   09/02/2016 Pathology Results   FISH for CLL came back positive for deletion 13q   09/15/2016 Imaging   1. Borderline enlarged abdominal and pelvic lymph nodes. Compared with 11/03/2011 these are decreased in size as detailed above. 2. Persistent splenomegaly. 3. Aortic Atherosclerosis (ICD10-I70.0). LAD coronary artery calcification noted.   10/06/2016 - 02/24/2017 Chemotherapy   He received Bendamustine and Rituxan   11/03/2016 Adverse Reaction   Dose of Bendamustine is reduced due to severe pancytopenia   12/28/2016 Imaging   1. Borderline enlarged abdominal peritoneal ligament and abdominal retroperitoneal lymph nodes, stable. 2. Splenomegaly. 3.  Aortic atherosclerosis (ICD10-170.0).   03/22/2017 PET scan   1. No hypermetabolic adenopathy identified within the neck, chest, abdomen or pelvis. 2. Prominent left retroperitoneal node measures 1.6 cm without significant FDG uptake. 3. Splenomegaly. 4. Aortic Atherosclerosis (ICD10-I70.0) and Emphysema (ICD10-J43.9). LAD and left circumflex atherosclerotic calcifications noted.   02/02/2018 Procedure   IMPRESSION: Widely patent right IJ power port catheter.   04/06/2018 Imaging   1. Interval enlargement of axillary, mediastinal, and retroperitoneal lymph nodes, as well as increased splenomegaly, findings concerning for progression of CLL in comparison to prior PET-CT dated 03/22/2017.  2.  Other chronic and incidental findings as detailed above.    04/16/2018 -  Chemotherapy   The patient had acalabrutinib for chemo   04/17/2018 - 04/21/2018 Hospital Admission   He was hospitalized for influenza    11/29/2018 Imaging   1. Interval response to therapy. No thoracic added not scratch set no thoracic or pelvic adenopathy identified. Significant improvement in abdominal adenopathy. Largest remaining lymph node measures 1.3cm in the left retroperitoneal region. Previously 2.5 cm. 2. Persistent splenomegaly, improved from previous exam. 3. No new or progressive disease identified within the chest, abdomen or pelvis. 4. Stable appearance of 5 mm right middle lobe lung nodule. 5. Aortic Atherosclerosis (ICD10-I70.0) and Emphysema (ICD10-J43.9). Coronary artery calcifications.       REVIEW OF SYSTEMS:   Constitutional: Denies fevers, chills or abnormal weight loss Eyes: Denies blurriness of vision Ears, nose, mouth, throat, and face: Denies mucositis or sore throat Cardiovascular: Denies palpitation, chest discomfort or lower extremity swelling Gastrointestinal:  Denies nausea, heartburn or change in bowel habits Skin: Denies abnormal skin rashes Lymphatics: Denies new lymphadenopathy  Neurological:Denies numbness, tingling or new weaknesses Behavioral/Psych: Mood is stable, no new changes  All other systems were reviewed with the patient and are negative.  I have reviewed the past medical history, past surgical history, social history and family history with the patient and they are unchanged from previous note.  ALLERGIES:  is allergic to immune globulin and codeine.  MEDICATIONS:  Current Outpatient Medications  Medication Sig Dispense Refill  . acyclovir (ZOVIRAX) 400 MG tablet Take 1 tablet (400 mg total) by mouth 2 (two) times daily. 180 tablet 1  . albuterol (PROVENTIL HFA) 108 (90 Base) MCG/ACT inhaler TAKE 2 PUFFS BY MOUTH EVERY 6 HOURS AS NEEDED FOR WHEEZE OR SHORTNESS OF BREATH 18 g 11  . allopurinol (ZYLOPRIM) 300 MG tablet TAKE  1 TABLET BY MOUTH EVERY DAY 90 tablet 1  . ALPRAZolam (XANAX) 0.5 MG tablet Take 0.5 mg by mouth as needed for anxiety.    Marland Kitchen b complex vitamins  tablet Take 1 tablet by mouth daily.     . benzonatate (TESSALON) 100 MG capsule TAKE TWO CAPSULES BY MOUTH THREE TIMES A DAY AS NEEDED COUGH 30 capsule 2  . CALQUENCE 100 MG capsule TAKE 100 MG BY MOUTH 2 (TWO) TIMES DAILY. 60 capsule 11  . Cholecalciferol 25 MCG (1000 UT) tablet Take 1,000 Units by mouth daily.     . famotidine (PEPCID) 20 MG tablet TAKE 1 TABLET BY MOUTH TWICE A DAY 180 tablet 3  . fluticasone furoate-vilanterol (BREO ELLIPTA) 200-25 MCG/INH AEPB Inhale 1 puff into the lungs daily. 1 each 5  . HYDROcodone-acetaminophen (NORCO) 10-325 MG tablet Take 1 tablet by mouth See admin instructions. Every 6 to 8 hours as needed for pain  0  . lidocaine-prilocaine (EMLA) cream APPLY TO AFFECTED AREA ONCE 30 g 3  . loperamide (IMODIUM) 2 MG capsule TAKE 2 CAPSULES (4 MG TOTAL) BY MOUTH EVERY 6 (SIX) HOURS AS NEEDED FOR DIARRHEA OR LOOSE STOOLS. 90 capsule 2  . meclizine (ANTIVERT) 12.5 MG tablet Take 12.5 mg by mouth every 6 (six) hours as needed for dizziness.   0  . metFORMIN (GLUCOPHAGE-XR) 500 MG 24 hr tablet Take 500-1,000 mg by mouth See admin instructions. Takes one tablet (585m)  in AM and 2 tablets (10034m at night.    . midodrine (PROAMATINE) 5 MG tablet Take 1 tablet (5 mg total) by mouth 3 (three) times daily with meals. 270 tablet 3  . mometasone (NASONEX) 50 MCG/ACT nasal spray Place 2 sprays into the nose daily. 1 each 11  . montelukast (SINGULAIR) 10 MG tablet TAKE 1 TABLET BY MOUTH EVERY DAY 90 tablet 1  . nitroGLYCERIN (NITROSTAT) 0.4 MG SL tablet PLACE 1 TABLET UNDER THE TONGUE EVERY 5 MINUTES X 3 DOSES AS NEEDED FOR CHEST PAIN *MAX 3 DOSES* 25 tablet 3  . pregabalin (LYRICA) 200 MG capsule TAKE 1 CAPSULE (200 MG TOTAL) BY MOUTH 3 (THREE) TIMES DAILY. 270 capsule 1  . rosuvastatin (CRESTOR) 5 MG tablet TAKE 1 TABLET BY MOUTH EVERY DAY 90 tablet 3  . tamsulosin (FLOMAX) 0.4 MG CAPS capsule Take 1 capsule (0.4 mg total) by mouth daily. 30 capsule 0  . zolpidem (AMBIEN) 10  MG tablet Take 10 mg by mouth at bedtime as needed for sleep.     No current facility-administered medications for this visit.   Facility-Administered Medications Ordered in Other Visits  Medication Dose Route Frequency Provider Last Rate Last Admin  . 0.9 %  sodium chloride infusion   Intravenous Continuous GoHeath LarkMD 50 mL/hr at 03/07/14 1005 New Bag at 03/07/14 1005  . sodium chloride 0.9 % injection 10 mL  10 mL Intracatheter PRN KhMarcy PanningMD   10 mL at 08/22/12 1721    PHYSICAL EXAMINATION: ECOG PERFORMANCE STATUS: 1 - Symptomatic but completely ambulatory  Vitals:   02/06/20 0846  BP: (!) 90/53  Pulse: 76  Resp: 18  Temp: 97.6 F (36.4 C)  SpO2: 99%   Filed Weights   02/06/20 0846  Weight: 162 lb 3.2 oz (73.6 kg)    GENERAL:alert, no distress and comfortable SKIN: Noted significant skin bruises EYES: normal, Conjunctiva are pink and non-injected, sclera clear OROPHARYNX:no exudate, no erythema and lips, buccal mucosa, and tongue normal  NECK: supple, thyroid normal size,  non-tender, without nodularity LYMPH:  no palpable lymphadenopathy in the cervical, axillary or inguinal LUNGS: Noted expiratory wheezes  HEART: regular rate & rhythm and no murmurs and no lower extremity edema ABDOMEN:abdomen soft, non-tender and normal bowel sounds Musculoskeletal:no cyanosis of digits and no clubbing  NEURO: alert & oriented x 3 with fluent speech, no focal motor/sensory deficits  LABORATORY DATA:  I have reviewed the data as listed    Component Value Date/Time   NA 143 01/01/2020 0851   NA 142 06/19/2019 0848   NA 136 01/26/2017 0940   K 3.8 01/01/2020 0851   K 4.0 01/26/2017 0940   CL 107 01/01/2020 0851   CL 104 08/03/2012 1229   CO2 29 01/01/2020 0851   CO2 23 01/26/2017 0940   GLUCOSE 98 01/01/2020 0851   GLUCOSE 146 (H) 01/26/2017 0940   GLUCOSE 223 (H) 08/03/2012 1229   BUN 11 01/01/2020 0851   BUN 13 06/19/2019 0848   BUN 9.6 01/26/2017 0940    CREATININE 0.68 01/01/2020 0851   CREATININE 1.04 04/24/2018 0821   CREATININE 1.0 01/26/2017 0940   CALCIUM 8.4 (L) 01/01/2020 0851   CALCIUM 8.7 01/26/2017 0940   PROT 5.2 (L) 01/01/2020 0851   PROT 5.9 (L) 04/13/2017 0823   PROT 5.7 (L) 01/26/2017 0940   ALBUMIN 2.9 (L) 01/01/2020 0851   ALBUMIN 3.6 01/26/2017 0940   AST 11 (L) 01/01/2020 0851   AST 13 (L) 04/24/2018 0821   AST 13 01/26/2017 0940   ALT <6 01/01/2020 0851   ALT 10 04/24/2018 0821   ALT 12 01/26/2017 0940   ALKPHOS 83 01/01/2020 0851   ALKPHOS 78 01/26/2017 0940   BILITOT 0.5 01/01/2020 0851   BILITOT 0.7 04/24/2018 0821   BILITOT 0.64 01/26/2017 0940   GFRNONAA >60 01/01/2020 0851   GFRNONAA >60 04/24/2018 0821   GFRAA >60 10/25/2019 0819   GFRAA >60 04/24/2018 0821    No results found for: SPEP, UPEP  Lab Results  Component Value Date   WBC 7.2 02/06/2020   NEUTROABS 5.0 02/06/2020   HGB 11.5 (L) 02/06/2020   HCT 36.3 (L) 02/06/2020   MCV 104.6 (H) 02/06/2020   PLT 92 (L) 02/06/2020      Chemistry      Component Value Date/Time   NA 143 01/01/2020 0851   NA 142 06/19/2019 0848   NA 136 01/26/2017 0940   K 3.8 01/01/2020 0851   K 4.0 01/26/2017 0940   CL 107 01/01/2020 0851   CL 104 08/03/2012 1229   CO2 29 01/01/2020 0851   CO2 23 01/26/2017 0940   BUN 11 01/01/2020 0851   BUN 13 06/19/2019 0848   BUN 9.6 01/26/2017 0940   CREATININE 0.68 01/01/2020 0851   CREATININE 1.04 04/24/2018 0821   CREATININE 1.0 01/26/2017 0940      Component Value Date/Time   CALCIUM 8.4 (L) 01/01/2020 0851   CALCIUM 8.7 01/26/2017 0940   ALKPHOS 83 01/01/2020 0851   ALKPHOS 78 01/26/2017 0940   AST 11 (L) 01/01/2020 0851   AST 13 (L) 04/24/2018 0821   AST 13 01/26/2017 0940   ALT <6 01/01/2020 0851   ALT 10 04/24/2018 0821   ALT 12 01/26/2017 0940   BILITOT 0.5 01/01/2020 0851   BILITOT 0.7 04/24/2018 0821   BILITOT 0.64 01/26/2017 0940       RADIOGRAPHIC STUDIES: I have personally reviewed  the radiological images as listed and agreed with the findings in the report. CT Chest Wo Contrast  Result Date: 01/17/2020 CLINICAL DATA:  Unresolved pneumonia. History chronic lymphocytic leukemia with recurrent infections. Weight loss. EXAM: CT CHEST WITHOUT CONTRAST TECHNIQUE: Multidetector CT imaging of the chest was performed following the standard protocol without IV contrast. COMPARISON:  CT 12/31/2019, 11/29/2019 and 11/29/2018. Radiographs 12/18/2019. FINDINGS: Cardiovascular: Probable stent in the left anterior descending coronary artery with diffuse atherosclerosis of the aorta, great vessels and coronary arteries. Right IJ Port-A-Cath extends to the superior cavoatrial junction. A small amount of pericardial fluid is similar to the previous study. The heart size is normal. Mediastinum/Nodes: Stable enlarged anterior left hilar lymph node demonstrating a short axis diameter of 1.6 cm on image 30/2.Stable small residual right axillary and mediastinal lymph nodes. The thyroid gland, trachea and esophagus demonstrate no significant findings. Lungs/Pleura: A small dependent left pleural effusion has improved. Underlying mild centrilobular and paraseptal emphysema. Interval improvement in the multifocal airspace opacities in both lower lobes. Cavitary process previously demonstrated in the lingula shows further improvement with residual patchy ground-glass opacities. There are 2 new pulmonary nodules, measuring 6 mm in the right lower lobe (image 106/3) and 8 mm in the left upper lobe (image 61/3). Other small nodules are grossly stable. Upper abdomen: The visualized upper abdomen appears stable status post cholecystectomy. Musculoskeletal/Chest wall: There is no chest wall mass or suspicious osseous finding. IMPRESSION: 1. Interval improvement in the multifocal airspace opacities in both lower lobes consistent with resolving pneumonia. 2. Two new pulmonary nodules in the right lower and left upper lobes,  likely inflammatory. Consider additional follow-up in 2-3 months. 3. Stable enlarged left hilar lymph node, likely reactive. 4. Aortic Atherosclerosis (ICD10-I70.0) and Emphysema (ICD10-J43.9). Electronically Signed   By: Richardean Sale M.D.   On: 01/17/2020 16:22

## 2020-02-06 NOTE — Assessment & Plan Note (Signed)
He tolerated Calquence well but his treatment course is complicated by recurrent infection and hospitalizations We will continue treatment as scheduled I recommend IVIG treatment along with aggressive supportive care

## 2020-02-06 NOTE — Assessment & Plan Note (Signed)
He is noted to have vitamin B12 deficiency I will start him on vitamin B12 replacement therapy monthly

## 2020-02-06 NOTE — Assessment & Plan Note (Signed)
We discussed monthly IVIG for the next 6 months and he is in agreement to proceed

## 2020-02-06 NOTE — Assessment & Plan Note (Signed)
He has multifactorial pancytopenia He is known to have splenomegaly 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, recent drop is likely exacerbated by infection From the anemia standpoint, I suspect is a component of anemia of chronic disease along with vitamin B12 deficiency He does not need transfusion support

## 2020-02-06 NOTE — Patient Instructions (Addendum)
Immune Globulin Injection What is this medicine? IMMUNE GLOBULIN (im MUNE GLOB yoo lin) helps to prevent or reduce the severity of certain infections in patients who are at risk. This medicine is collected from the pooled blood of many donors. It is used to treat immune system problems, thrombocytopenia, and Kawasaki syndrome. This medicine may be used for other purposes; ask your health care provider or pharmacist if you have questions. COMMON BRAND NAME(S): ASCENIV, Baygam, BIVIGAM, Carimune, Carimune NF, cutaquig, Cuvitru, Flebogamma, Flebogamma DIF, GamaSTAN, GamaSTAN S/D, Gamimune N, Gammagard, Gammagard S/D, Gammaked, Gammaplex, Gammar-P IV, Gamunex, Gamunex-C, Hizentra, Iveegam, Iveegam EN, Octagam, Panglobulin, Panglobulin NF, panzyga, Polygam S/D, Privigen, Sandoglobulin, Venoglobulin-S, Vigam, Vivaglobulin, Xembify What should I tell my health care provider before I take this medicine? They need to know if you have any of these conditions:  diabetes  extremely low or no immune antibodies in the blood  heart disease  history of blood clots  hyperprolinemia  infection in the blood, sepsis  kidney disease  recently received or scheduled to receive a vaccination  an unusual or allergic reaction to human immune globulin, albumin, maltose, sucrose, other medicines, foods, dyes, or preservatives  pregnant or trying to get pregnant  breast-feeding How should I use this medicine? This medicine is for injection into a muscle or infusion into a vein or skin. It is usually given by a health care professional in a hospital or clinic setting. In rare cases, some brands of this medicine might be given at home. You will be taught how to give this medicine. Use exactly as directed. Take your medicine at regular intervals. Do not take your medicine more often than directed. Talk to your pediatrician regarding the use of this medicine in children. While this drug may be prescribed for selected  conditions, precautions do apply. Overdosage: If you think you have taken too much of this medicine contact a poison control center or emergency room at once. NOTE: This medicine is only for you. Do not share this medicine with others. What if I miss a dose? It is important not to miss your dose. Call your doctor or health care professional if you are unable to keep an appointment. If you give yourself the medicine and you miss a dose, take it as soon as you can. If it is almost time for your next dose, take only that dose. Do not take double or extra doses. What may interact with this medicine?  aspirin and aspirin-like medicines  cisplatin  cyclosporine  medicines for infection like acyclovir, adefovir, amphotericin B, bacitracin, cidofovir, foscarnet, ganciclovir, gentamicin, pentamidine, vancomycin  NSAIDS, medicines for pain and inflammation, like ibuprofen or naproxen  pamidronate  vaccines  zoledronic acid This list may not describe all possible interactions. Give your health care provider a list of all the medicines, herbs, non-prescription drugs, or dietary supplements you use. Also tell them if you smoke, drink alcohol, or use illegal drugs. Some items may interact with your medicine. What should I watch for while using this medicine? Your condition will be monitored carefully while you are receiving this medicine. This medicine is made from pooled blood donations of many different people. It may be possible to pass an infection in this medicine. However, the donors are screened for infections and all products are tested for HIV and hepatitis. The medicine is treated to kill most or all bacteria and viruses. Talk to your doctor about the risks and benefits of this medicine. Do not have vaccinations for at least   14 days before, or until at least 3 months after receiving this medicine. What side effects may I notice from receiving this medicine? Side effects that you should report  to your doctor or health care professional as soon as possible:  allergic reactions like skin rash, itching or hives, swelling of the face, lips, or tongue  blue colored lips or skin  breathing problems  chest pain or tightness  fever  signs and symptoms of aseptic meningitis such as stiff neck; sensitivity to light; headache; drowsiness; fever; nausea; vomiting; rash  signs and symptoms of a blood clot such as chest pain; shortness of breath; pain, swelling, or warmth in the leg  signs and symptoms of hemolytic anemia such as fast heartbeat; tiredness; dark yellow or brown urine; or yellowing of the eyes or skin  signs and symptoms of kidney injury like trouble passing urine or change in the amount of urine  sudden weight gain  swelling of the ankles, feet, hands Side effects that usually do not require medical attention (report to your doctor or health care professional if they continue or are bothersome):  diarrhea  flushing  headache  increased sweating  joint pain  muscle cramps  muscle pain  nausea  pain, redness, or irritation at site where injected  tiredness This list may not describe all possible side effects. Call your doctor for medical advice about side effects. You may report side effects to FDA at 1-800-FDA-1088. Where should I keep my medicine? Keep out of the reach of children. This drug is usually given in a hospital or clinic and will not be stored at home. In rare cases, some brands of this medicine may be given at home. If you are using this medicine at home, you will be instructed on how to store this medicine. Throw away any unused medicine after the expiration date on the label. NOTE: This sheet is a summary. It may not cover all possible information. If you have questions about this medicine, talk to your doctor, pharmacist, or health care provider.  2020 Elsevier/Gold Standard (2018-09-05 12:51:14)  

## 2020-02-06 NOTE — Assessment & Plan Note (Signed)
He will continue medical management by pulmonologist with plan to repeat CT scan next year

## 2020-02-07 LAB — IGG: IgG (Immunoglobin G), Serum: 658 mg/dL (ref 603–1613)

## 2020-02-11 ENCOUNTER — Other Ambulatory Visit: Payer: Self-pay | Admitting: Hematology and Oncology

## 2020-02-11 DIAGNOSIS — C9110 Chronic lymphocytic leukemia of B-cell type not having achieved remission: Secondary | ICD-10-CM

## 2020-02-12 ENCOUNTER — Other Ambulatory Visit: Payer: Self-pay | Admitting: *Deleted

## 2020-02-12 DIAGNOSIS — J411 Mucopurulent chronic bronchitis: Secondary | ICD-10-CM

## 2020-02-12 DIAGNOSIS — J441 Chronic obstructive pulmonary disease with (acute) exacerbation: Secondary | ICD-10-CM

## 2020-02-15 ENCOUNTER — Inpatient Hospital Stay (HOSPITAL_COMMUNITY)
Admission: EM | Admit: 2020-02-15 | Discharge: 2020-02-20 | DRG: 698 | Disposition: A | Discharge status: Home / Self Care | Payer: Medicare Other | Attending: Internal Medicine | Admitting: Internal Medicine

## 2020-02-15 ENCOUNTER — Other Ambulatory Visit: Payer: Self-pay

## 2020-02-15 ENCOUNTER — Emergency Department (HOSPITAL_COMMUNITY): Payer: Medicare Other

## 2020-02-15 DIAGNOSIS — Z7951 Long term (current) use of inhaled steroids: Secondary | ICD-10-CM | POA: Diagnosis not present

## 2020-02-15 DIAGNOSIS — E1151 Type 2 diabetes mellitus with diabetic peripheral angiopathy without gangrene: Secondary | ICD-10-CM | POA: Diagnosis present

## 2020-02-15 DIAGNOSIS — K219 Gastro-esophageal reflux disease without esophagitis: Secondary | ICD-10-CM | POA: Diagnosis present

## 2020-02-15 DIAGNOSIS — E785 Hyperlipidemia, unspecified: Secondary | ICD-10-CM | POA: Diagnosis present

## 2020-02-15 DIAGNOSIS — I5032 Chronic diastolic (congestive) heart failure: Secondary | ICD-10-CM | POA: Diagnosis present

## 2020-02-15 DIAGNOSIS — N3001 Acute cystitis with hematuria: Secondary | ICD-10-CM | POA: Diagnosis present

## 2020-02-15 DIAGNOSIS — Z781 Physical restraint status: Secondary | ICD-10-CM | POA: Diagnosis not present

## 2020-02-15 DIAGNOSIS — Z87891 Personal history of nicotine dependence: Secondary | ICD-10-CM | POA: Diagnosis not present

## 2020-02-15 DIAGNOSIS — C8308 Small cell B-cell lymphoma, lymph nodes of multiple sites: Secondary | ICD-10-CM | POA: Diagnosis present

## 2020-02-15 DIAGNOSIS — Z9049 Acquired absence of other specified parts of digestive tract: Secondary | ICD-10-CM

## 2020-02-15 DIAGNOSIS — N400 Enlarged prostate without lower urinary tract symptoms: Secondary | ICD-10-CM | POA: Diagnosis present

## 2020-02-15 DIAGNOSIS — N3289 Other specified disorders of bladder: Secondary | ICD-10-CM | POA: Diagnosis present

## 2020-02-15 DIAGNOSIS — I252 Old myocardial infarction: Secondary | ICD-10-CM

## 2020-02-15 DIAGNOSIS — N4889 Other specified disorders of penis: Secondary | ICD-10-CM | POA: Diagnosis not present

## 2020-02-15 DIAGNOSIS — Z20822 Contact with and (suspected) exposure to covid-19: Secondary | ICD-10-CM | POA: Diagnosis present

## 2020-02-15 DIAGNOSIS — Z955 Presence of coronary angioplasty implant and graft: Secondary | ICD-10-CM | POA: Diagnosis not present

## 2020-02-15 DIAGNOSIS — J449 Chronic obstructive pulmonary disease, unspecified: Secondary | ICD-10-CM | POA: Diagnosis present

## 2020-02-15 DIAGNOSIS — A419 Sepsis, unspecified organism: Secondary | ICD-10-CM | POA: Diagnosis present

## 2020-02-15 DIAGNOSIS — E119 Type 2 diabetes mellitus without complications: Secondary | ICD-10-CM

## 2020-02-15 DIAGNOSIS — A4152 Sepsis due to Pseudomonas: Secondary | ICD-10-CM | POA: Diagnosis present

## 2020-02-15 DIAGNOSIS — N390 Urinary tract infection, site not specified: Secondary | ICD-10-CM | POA: Diagnosis present

## 2020-02-15 DIAGNOSIS — D801 Nonfamilial hypogammaglobulinemia: Secondary | ICD-10-CM | POA: Diagnosis present

## 2020-02-15 DIAGNOSIS — R6521 Severe sepsis with septic shock: Secondary | ICD-10-CM | POA: Diagnosis present

## 2020-02-15 DIAGNOSIS — T83518A Infection and inflammatory reaction due to other urinary catheter, initial encounter: Principal | ICD-10-CM | POA: Diagnosis present

## 2020-02-15 DIAGNOSIS — R3989 Other symptoms and signs involving the genitourinary system: Secondary | ICD-10-CM | POA: Diagnosis not present

## 2020-02-15 DIAGNOSIS — Z7984 Long term (current) use of oral hypoglycemic drugs: Secondary | ICD-10-CM | POA: Diagnosis not present

## 2020-02-15 DIAGNOSIS — I2510 Atherosclerotic heart disease of native coronary artery without angina pectoris: Secondary | ICD-10-CM | POA: Diagnosis present

## 2020-02-15 DIAGNOSIS — Y846 Urinary catheterization as the cause of abnormal reaction of the patient, or of later complication, without mention of misadventure at the time of the procedure: Secondary | ICD-10-CM | POA: Diagnosis present

## 2020-02-15 DIAGNOSIS — R4182 Altered mental status, unspecified: Secondary | ICD-10-CM | POA: Diagnosis present

## 2020-02-15 DIAGNOSIS — I9589 Other hypotension: Secondary | ICD-10-CM | POA: Diagnosis present

## 2020-02-15 DIAGNOSIS — G9340 Encephalopathy, unspecified: Secondary | ICD-10-CM | POA: Diagnosis present

## 2020-02-15 HISTORY — DX: Urinary tract infection, site not specified: N39.0

## 2020-02-15 LAB — COMPREHENSIVE METABOLIC PANEL
ALT: 10 U/L (ref 0–44)
AST: 11 U/L — ABNORMAL LOW (ref 15–41)
Albumin: 2.4 g/dL — ABNORMAL LOW (ref 3.5–5.0)
Alkaline Phosphatase: 67 U/L (ref 38–126)
Anion gap: 8 (ref 5–15)
BUN: 9 mg/dL (ref 8–23)
CO2: 21 mmol/L — ABNORMAL LOW (ref 22–32)
Calcium: 7.6 mg/dL — ABNORMAL LOW (ref 8.9–10.3)
Chloride: 111 mmol/L (ref 98–111)
Creatinine, Ser: 0.61 mg/dL (ref 0.61–1.24)
GFR, Estimated: >60 mL/min (ref >60)
Glucose, Bld: 92 mg/dL (ref 70–99)
Potassium: 3.6 mmol/L (ref 3.5–5.1)
Sodium: 140 mmol/L (ref 135–145)
Total Bilirubin: 0.5 mg/dL (ref 0.3–1.2)
Total Protein: 4.8 g/dL — ABNORMAL LOW (ref 6.5–8.1)

## 2020-02-15 LAB — RESP PANEL BY RT-PCR (FLU A&B, COVID) ARPGX2
Influenza A by PCR: NEGATIVE
Influenza B by PCR: NEGATIVE
SARS Coronavirus 2 by RT PCR: NEGATIVE

## 2020-02-15 LAB — CBC WITH DIFFERENTIAL/PLATELET
Abs Immature Granulocytes: 0.15 10*3/uL — ABNORMAL HIGH (ref 0.00–0.07)
Basophils Absolute: 0 10*3/uL (ref 0.0–0.1)
Basophils Relative: 0 %
Eosinophils Absolute: 0 10*3/uL (ref 0.0–0.5)
Eosinophils Relative: 0 %
HCT: 30.4 % — ABNORMAL LOW (ref 39.0–52.0)
Hemoglobin: 9.9 g/dL — ABNORMAL LOW (ref 13.0–17.0)
Immature Granulocytes: 2 %
Lymphocytes Relative: 13 %
Lymphs Abs: 0.9 10*3/uL (ref 0.7–4.0)
MCH: 34 pg (ref 26.0–34.0)
MCHC: 32.6 g/dL (ref 30.0–36.0)
MCV: 104.5 fL — ABNORMAL HIGH (ref 80.0–100.0)
Monocytes Absolute: 0.6 10*3/uL (ref 0.1–1.0)
Monocytes Relative: 8 %
Neutro Abs: 5.3 10*3/uL (ref 1.7–7.7)
Neutrophils Relative %: 77 %
Platelets: 73 10*3/uL — ABNORMAL LOW (ref 150–400)
RBC: 2.91 MIL/uL — ABNORMAL LOW (ref 4.22–5.81)
RDW: 18.2 % — ABNORMAL HIGH (ref 11.5–15.5)
WBC: 6.9 10*3/uL (ref 4.0–10.5)
nRBC: 0 % (ref 0.0–0.2)

## 2020-02-15 LAB — HEMOGLOBIN A1C
Hgb A1c MFr Bld: 4.5 % — ABNORMAL LOW (ref 4.8–5.6)
Mean Plasma Glucose: 82.45 mg/dL

## 2020-02-15 LAB — PROTIME-INR
INR: 1.3 — ABNORMAL HIGH (ref 0.8–1.2)
Prothrombin Time: 15.4 seconds — ABNORMAL HIGH (ref 11.4–15.2)

## 2020-02-15 LAB — URINALYSIS, ROUTINE W REFLEX MICROSCOPIC
Bacteria, UA: NONE SEEN
Bilirubin Urine: NEGATIVE
Glucose, UA: NEGATIVE mg/dL
Ketones, ur: NEGATIVE mg/dL
Nitrite: NEGATIVE
Protein, ur: NEGATIVE mg/dL
Specific Gravity, Urine: 1.011 (ref 1.005–1.030)
WBC, UA: >50 WBC/hpf — ABNORMAL HIGH (ref 0–5)
pH: 5 (ref 5.0–8.0)

## 2020-02-15 LAB — CBG MONITORING, ED: Glucose-Capillary: 80 mg/dL (ref 70–99)

## 2020-02-15 LAB — APTT: aPTT: 38 seconds — ABNORMAL HIGH (ref 24–36)

## 2020-02-15 LAB — BRAIN NATRIURETIC PEPTIDE: B Natriuretic Peptide: 146.4 pg/mL — ABNORMAL HIGH (ref 0.0–100.0)

## 2020-02-15 LAB — GLUCOSE, CAPILLARY: Glucose-Capillary: 150 mg/dL — ABNORMAL HIGH (ref 70–99)

## 2020-02-15 LAB — CORTISOL: Cortisol, Plasma: 14.9 ug/dL

## 2020-02-15 LAB — LACTIC ACID, PLASMA: Lactic Acid, Venous: 0.7 mmol/L (ref 0.5–1.9)

## 2020-02-15 MED ORDER — DOCUSATE SODIUM 100 MG PO CAPS: 100.0000 mg | ORAL_CAPSULE | Freq: Two times a day (BID) | ORAL | Status: DC | PRN

## 2020-02-15 MED ORDER — CHLORHEXIDINE GLUCONATE CLOTH 2 % EX PADS
6.0000 | MEDICATED_PAD | Freq: Every day | CUTANEOUS | Status: DC
  Administered 2020-02-16 – 2020-02-20 (×5): 6 via TOPICAL

## 2020-02-15 MED ORDER — NOREPINEPHRINE 4 MG/250ML-% IV SOLN
0.0000 ug/min | INTRAVENOUS | Status: DC
  Administered 2020-02-15: 2 ug/min via INTRAVENOUS
  Filled 2020-02-15: qty 250

## 2020-02-15 MED ORDER — PIPERACILLIN-TAZOBACTAM 3.375 G IVPB
3.3750 g | Freq: Three times a day (TID) | INTRAVENOUS | Status: DC
  Administered 2020-02-15 – 2020-02-20 (×16): 3.375 g via INTRAVENOUS
  Filled 2020-02-15 (×16): qty 50

## 2020-02-15 MED ORDER — ALBUTEROL SULFATE (2.5 MG/3ML) 0.083% IN NEBU: 2.5000 mg | INHALATION_SOLUTION | RESPIRATORY_TRACT | Status: DC | PRN

## 2020-02-15 MED ORDER — VANCOMYCIN HCL IN DEXTROSE 1-5 GM/200ML-% IV SOLN
1000.0000 mg | Freq: Once | INTRAVENOUS | Status: AC
  Administered 2020-02-15: 1000 mg via INTRAVENOUS
  Filled 2020-02-15: qty 200

## 2020-02-15 MED ORDER — BENZONATATE 100 MG PO CAPS: 100.0000 mg | ORAL_CAPSULE | Freq: Three times a day (TID) | ORAL | Status: DC | PRN

## 2020-02-15 MED ORDER — LACTATED RINGERS IV BOLUS (SEPSIS)
1000.0000 mL | Freq: Once | INTRAVENOUS | Status: AC
  Administered 2020-02-15: 1000 mL via INTRAVENOUS

## 2020-02-15 MED ORDER — HEPARIN SODIUM (PORCINE) 5000 UNIT/ML IJ SOLN
5000.0000 [IU] | Freq: Three times a day (TID) | INTRAMUSCULAR | Status: DC
  Administered 2020-02-16 – 2020-02-20 (×8): 5000 [IU] via SUBCUTANEOUS
  Filled 2020-02-15 (×12): qty 1

## 2020-02-15 MED ORDER — SODIUM CHLORIDE 0.9% FLUSH: 10.0000 mL | INTRAVENOUS | Status: DC | PRN

## 2020-02-15 MED ORDER — ACETAMINOPHEN 325 MG PO TABS
650.0000 mg | ORAL_TABLET | Freq: Four times a day (QID) | ORAL | Status: DC | PRN
  Administered 2020-02-15 – 2020-02-20 (×5): 650 mg via ORAL
  Filled 2020-02-15 (×5): qty 2

## 2020-02-15 MED ORDER — LACTATED RINGERS IV BOLUS
1000.0000 mL | Freq: Once | INTRAVENOUS | Status: AC
  Administered 2020-02-15: 1000 mL via INTRAVENOUS

## 2020-02-15 MED ORDER — POLYETHYLENE GLYCOL 3350 17 G PO PACK: 17.0000 g | PACK | Freq: Every day | ORAL | Status: DC | PRN

## 2020-02-15 MED ORDER — MIDODRINE HCL 5 MG PO TABS
5.0000 mg | ORAL_TABLET | Freq: Three times a day (TID) | ORAL | Status: DC
  Administered 2020-02-15 – 2020-02-20 (×15): 5 mg via ORAL
  Filled 2020-02-15 (×15): qty 1

## 2020-02-15 MED ORDER — VANCOMYCIN HCL IN DEXTROSE 1-5 GM/200ML-% IV SOLN
1000.0000 mg | Freq: Two times a day (BID) | INTRAVENOUS | Status: DC
  Administered 2020-02-15 – 2020-02-16 (×2): 1000 mg via INTRAVENOUS
  Filled 2020-02-15 (×2): qty 200

## 2020-02-15 MED ORDER — PIPERACILLIN-TAZOBACTAM 3.375 G IVPB
3.3750 g | Freq: Once | INTRAVENOUS | Status: AC
  Administered 2020-02-15: 3.375 g via INTRAVENOUS
  Filled 2020-02-15: qty 50

## 2020-02-15 MED ORDER — FAMOTIDINE 20 MG PO TABS
20.0000 mg | ORAL_TABLET | Freq: Every day | ORAL | Status: DC
  Administered 2020-02-18 – 2020-02-20 (×3): 20 mg via ORAL
  Filled 2020-02-15 (×5): qty 1

## 2020-02-15 MED ORDER — LACTATED RINGERS IV BOLUS (SEPSIS): 250.0000 mL | Freq: Once | INTRAVENOUS | Status: DC

## 2020-02-15 MED ORDER — ACYCLOVIR 200 MG PO CAPS
400.0000 mg | ORAL_CAPSULE | Freq: Two times a day (BID) | ORAL | Status: DC
  Administered 2020-02-15 – 2020-02-20 (×10): 400 mg via ORAL
  Filled 2020-02-15 (×12): qty 2

## 2020-02-15 MED ORDER — LACTATED RINGERS IV SOLN: INTRAVENOUS | Status: AC

## 2020-02-15 MED ORDER — LIDOCAINE HCL URETHRAL/MUCOSAL 2 % EX GEL
1.0000 "application " | Freq: Once | CUTANEOUS | Status: AC
  Administered 2020-02-16: 1 via URETHRAL
  Filled 2020-02-15: qty 11

## 2020-02-15 MED ORDER — SODIUM CHLORIDE 0.9% FLUSH
10.0000 mL | Freq: Two times a day (BID) | INTRAVENOUS | Status: DC
  Administered 2020-02-15 – 2020-02-20 (×8): 10 mL

## 2020-02-15 MED ORDER — INSULIN ASPART 100 UNIT/ML ~~LOC~~ SOLN: 0.0000 [IU] | SUBCUTANEOUS | Status: DC

## 2020-02-15 NOTE — ED Provider Notes (Signed)
UA pending, recently treated for pneumonia, more distant history of Pseudomonas UTI.  Patient presents febrile with altered mental status.  Has had 30cc/kg fluids and Zosyn. Needs admission. Physical Exam  BP (!) 86/48   Pulse 75   Temp 99.6 F (37.6 C) (Oral)   Resp (!) 21   Ht 6' (1.829 m)   Wt 73.6 kg   SpO2 100%   BMI 22.01 kg/m   Physical Exam  ED Course/Procedures     Procedures  MDM   Patient is consulted with critical care.  Critical care to admit for persistent hypotension after fluid resuscitation and sepsis.      Charlesetta Shanks, MD 02/15/20 [phone]

## 2020-02-15 NOTE — ED Triage Notes (Addendum)
[facility] responded for a fall.  When they arrived the pt was hypotensive at 73/42 and lethargic. There was no LOC, pain or obvious injury from the fall. Pt is being treated for Leukemia currently, has a recent dx of UTI and has been fighting pneumonia since Oct.  EMS reports temp of 101.2
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
## 2020-02-15 NOTE — ED Notes (Signed)
Pt hit call light. This RN to bedside. Pt continually cusses at this RN and states "screw you."  Pt reports he wants to self-cath. Educated pt I need an order for in/out cathter. Pt continues to say "screrw you." Pt reports he is calling his wife to bring him in/out catheter.

## 2020-02-15 NOTE — ED Notes (Signed)
Pt complaining of feeling like he needs to pee eventhough foley is in.  Pt has voided 1200 through foley so far

## 2020-02-15 NOTE — ED Notes (Addendum)
Pt refused all care including meds and blood draw "until I get my damn catheter."  Pt continually cussing at this RN. This RN notified him he had catheter in place that was functioning well  And requested it be removed. Prior RN removed foley cath. Told pt that this RN would not tolerate being spoke to like that. Pt continues cussing at this RN.

## 2020-02-15 NOTE — ED Provider Notes (Signed)
[facility] EMERGENCY DEPARTMENT Provider Note   CSN: [id number] Arrival date & time: 02/15/20  0446     History Chief Complaint  Patient presents with  . Code Sepsis    Ryan Copeland is a 70 y.o. male.  Patient brought to the emergency department with concerns of possible sepsis.  Patient reportedly has recently been treated for urinary tract infection as well as pneumonia.  He fell out of bed tonight because of generalized weakness.  Denies any injury.  No headache, neck pain, back pain.  Patient noted to be febrile by EMS.  Patient has been hypotensive as well, administered 1300 mL of IV fluids during transport with only some improvement of blood pressure.        Past Medical History:  Diagnosis Date  . Anxiety 01/30/2014  . Back injury    lower disc  . CAD (coronary artery disease)   . CLL (chronic lymphocytic leukemia) ([place]) 03/18/2011  . COPD (chronic obstructive pulmonary disease) ([place])   . Diabetes mellitus ([place])    Type 2   . ECRB (extensor carpi radialis brevis) tenosynovitis   . GERD (gastroesophageal reflux disease)    takes Nexium if needed  . Hyperlipidemia   . Lateral epicondylitis of left elbow   . MI, acute, non ST segment elevation ([place]) 06/28/2009   with stenting of the LAD  . Neuromuscular disorder ([place])    peripheral neuropathy  . PVD (peripheral vascular disease) ([place])   . Tobacco abuse     Patient Active Problem List   Diagnosis Date Noted  . Vitamin B12 deficiency 01/01/2020  . Hypotension due to drugs 01/01/2020  . Protein-calorie malnutrition, moderate ([place]) 01/01/2020  . Septic shock ([place]) 12/15/2019  . Community acquired bacterial pneumonia 11/28/2019  . Cerebral vascular disease 11/25/2019  . 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 Other fatigue 10/25/2019  . Deficiency anemia 10/25/2019  . Dizziness 10/22/2019  . Gait abnormality 10/22/2019  . Hx of nonmelanoma skin cancer  03/15/2019  . Essential hypertension 04/24/2018  . Influenza A 04/17/2018  . Sepsis ([place]) 04/17/2018  . Chronic diastolic CHF (congestive heart failure) ([place]) 04/17/2018  . Skin rash 08/07/2017  . Port-A-Cath in place 08/04/2017  . 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 Retained dental roots 04/26/2017  . Dental caries 04/26/2017  . 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 Loose, teeth 04/26/2017  . Peripheral polyneuropathy 04/14/2017  . COPD mixed type ([place]) 02/23/2017  . Chronic sinusitis 01/27/2017  . Splenomegaly, congestive, chronic 12/29/2016  . Diarrhea 12/01/2016  . Poor dentition 12/01/2016  . Benign mole 12/01/2016  . Pancytopenia, acquired ([place]) 11/03/2016  . Odynophagia 01/15/2016  . Polycythemia, secondary 06/26/2015  . Quality of life palliative care encounter 06/26/2015  . Atherosclerosis of extremity with intermittent claudication ([place]) 01/23/2015  . Lateral epicondylitis of left elbow   . ECRB (extensor carpi radialis brevis) tenosynovitis   . Anxiety 01/30/2014  . Preoperative clearance 01/30/2014  . Thrombocytopenia ([place]) 01/30/2014  . Diabetes mellitus without complication ([place]) 47/65/4650  . Hypogammaglobulinemia, acquired ([place]) 12/26/2012  . Hereditary and idiopathic peripheral neuropathy 08/20/2012  . 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 CAD (coronary artery disease) 07/29/2011  . 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 MI, acute, non ST segment elevation ([place])   . Hyperlipidemia   . PVD (peripheral vascular disease) ([place])   . GERD 09/27/2008  . SHOULDER STRAIN, RIGHT 09/27/2008    Past Surgical  History:  Procedure Laterality Date  . ADENOIDECTOMY  1955  . CARDIAC CATHETERIZATION    . CARDIAC CATHETERIZATION N/A 09/26/2014   Procedure: Left Heart Cath and Coronary Angiography;  Surgeon: Peter M Martinique, MD;  Location: [facility] CV LAB;  Service: Cardiovascular;  Laterality: N/A;  . carpel  tunnel release Left 04-1989  . carpel tunnel release  Right 01-1989  . CHOLECYSTECTOMY  2007  . CORONARY STENT PLACEMENT  May 2011  . femoral stents    . IR CV LINE INJECTION  08/18/2017  . IR CV LINE INJECTION  09/01/2017  . IR CV LINE INJECTION  02/02/2018  . LATERAL EPICONDYLE RELEASE Left 02/12/2014   Procedure: LEFT ELBOW DEBRIDEMENT WITH TENDON REPAIR ;  Surgeon: Lorn Junes, MD;  Location: [facility];  Service: Orthopedics;  Laterality: Left;  . LEFT CAI STENT/PTA AND POPLITEAL ARTERY/TIBIAL THROMBECTOMY     . LEFT HEART CATHETERIZATION WITH CORONARY ANGIOGRAM N/A 08/26/2011   Procedure: LEFT HEART CATHETERIZATION WITH CORONARY ANGIOGRAM;  Surgeon: Peter M Martinique, MD;  Location: [facility] CATH LAB;  Service: Cardiovascular;  Laterality: N/A;  . PERIPHERAL VASCULAR CATHETERIZATION N/A 01/01/2015   Procedure: Abdominal Aortogram;  Surgeon: Conrad Danville, MD;  Location: [facility] CV LAB;  Service: Cardiovascular;  Laterality: N/A;  . TARSAL TUNNEL RELEASE Bilateral 08-2007       Family History  Problem Relation Age of Onset  . Lung cancer Mother 41  . Cancer Mother        lung  . Heart failure Father 3  . Heart disease Father     Social History   Tobacco Use  . Smoking status: Former Smoker    Packs/day: 1.00    Years: 44.00    Pack years: 44.00    Types: Cigarettes    Quit date: 04/23/2016    Years since quitting: 3.8  . Smokeless tobacco: Former Network engineer  . Vaping Use: Never used  Substance Use Topics  . Alcohol use: No    Alcohol/week: 0.0 standard drinks    Comment:  drinks non-alcoholic beer  . Drug use: No    Home Medications Prior to Admission medications   Medication Sig Start Date End Date Taking? Authorizing Provider  acyclovir (ZOVIRAX) 400 MG tablet Take 1 tablet (400 mg total) by mouth 2 (two) times daily. 01/08/20   [name], MD  albuterol (PROVENTIL HFA) 108 (90 Base) MCG/ACT inhaler TAKE 2 PUFFS BY MOUTH EVERY 6 HOURS AS NEEDED FOR WHEEZE OR  SHORTNESS OF BREATH 06/27/19   Spero Geralds, MD  allopurinol (ZYLOPRIM) 300 MG tablet TAKE 1 TABLET BY MOUTH EVERY DAY 09/23/19   [name], MD  ALPRAZolam Duanne Moron) 0.5 MG tablet Take 0.5 mg by mouth as needed for anxiety.    [provider]  b complex vitamins tablet Take 1 tablet by mouth daily.     [provider]  benzonatate (TESSALON) 100 MG capsule TAKE TWO CAPSULES BY MOUTH THREE TIMES A DAY AS NEEDED COUGH 01/10/20   Spero Geralds, MD  CALQUENCE 100 MG capsule TAKE 100 MG BY MOUTH 2 (TWO) TIMES DAILY. 04/11/19   [name], MD  Cholecalciferol 25 MCG (1000 UT) tablet Take 1,000 Units by mouth daily.     [provider]  famotidine (PEPCID) 20 MG tablet TAKE 1 TABLET BY MOUTH TWICE A DAY 02/11/19   Gorsuch, Ni, MD  fluticasone furoate-vilanterol (BREO ELLIPTA) 200-25 MCG/INH AEPB Inhale 1 puff into the lungs daily. 06/27/19  Spero Geralds, MD  HYDROcodone-acetaminophen [facility]) 10-325 MG tablet Take 1 tablet by mouth See admin instructions. Every 6 to 8 hours as needed for pain 04/14/15   [provider]  lidocaine-prilocaine (EMLA) cream APPLY TO AFFECTED AREA ONCE 12/21/17   Nicholas Lose, MD  loperamide (IMODIUM) 2 MG capsule TAKE 2 CAPSULES (4 MG TOTAL) BY MOUTH EVERY 6 (SIX) HOURS AS NEEDED FOR DIARRHEA OR LOOSE STOOLS. 01/28/20   [name], MD  meclizine (ANTIVERT) 12.5 MG tablet Take 12.5 mg by mouth every 6 (six) hours as needed for dizziness.  12/24/14   [provider]  metFORMIN (GLUCOPHAGE-XR) 500 MG 24 hr tablet Take 500-1,000 mg by mouth See admin instructions. Takes one tablet (500mg )  in AM and 2 tablets (1000mg ) at night.    [provider]  midodrine (PROAMATINE) 5 MG tablet Take 1 tablet (5 mg total) by mouth 3 (three) times daily with meals. 01/15/20   Burtis Junes, NP  mometasone (NASONEX) 50 MCG/ACT nasal spray Place 2 sprays into the nose daily. 11/08/19   Spero Geralds, MD  montelukast (SINGULAIR) 10 MG  tablet TAKE 1 TABLET BY MOUTH EVERY DAY 12/11/19   Spero Geralds, MD  nitroGLYCERIN (NITROSTAT) 0.4 MG SL tablet PLACE 1 TABLET UNDER THE TONGUE EVERY 5 MINUTES X 3 DOSES AS NEEDED FOR CHEST PAIN *MAX 3 DOSES* 10/03/19   Burtis Junes, NP  pregabalin (LYRICA) 200 MG capsule TAKE 1 CAPSULE (200 MG TOTAL) BY MOUTH 3 (THREE) TIMES DAILY. 09/02/19   Suzzanne Cloud, NP  rosuvastatin (CRESTOR) 5 MG tablet TAKE 1 TABLET BY MOUTH EVERY DAY 07/24/19   Burtis Junes, NP  tamsulosin (FLOMAX) 0.4 MG CAPS capsule Take 1 capsule (0.4 mg total) by mouth daily. 12/06/19   Charlynne Cousins, MD  zolpidem (AMBIEN) 10 MG tablet Take 10 mg by mouth at bedtime as needed for sleep.    [provider]    Allergies    Immune globulin and Codeine  Review of Systems   Review of Systems  Constitutional: Positive for fatigue and fever.  All other systems reviewed and are negative.   Physical Exam Updated Vital Signs BP (!) 86/48   Pulse 75   Temp 99.6 F (37.6 C) (Oral)   Resp (!) 21   Ht 6' (1.829 m)   Wt 73.6 kg   SpO2 100%   BMI 22.01 kg/m   Physical Exam Vitals and nursing note reviewed.  Constitutional:      General: He is not in acute distress.    Appearance: Normal appearance. He is well-developed and well-nourished.  HENT:     Head: Normocephalic and atraumatic.     Right Ear: Hearing normal.     Left Ear: Hearing normal.     Nose: Nose normal.     Mouth/Throat:     Mouth: Oropharynx is clear and moist and mucous membranes are normal.  Eyes:     Extraocular Movements: EOM normal.     Conjunctiva/sclera: Conjunctivae normal.     Pupils: Pupils are equal, round, and reactive to light.  Cardiovascular:     Rate and Rhythm: Regular rhythm.     Heart sounds: S1 normal and S2 normal. No murmur heard. No friction rub. No gallop.   Pulmonary:     Effort: Pulmonary effort is normal. Tachypnea present. No respiratory distress.     Breath sounds: Rhonchi present.  Chest:      Chest wall: No tenderness.  Abdominal:     General: Bowel sounds are normal.     Palpations: Abdomen is soft. There is no hepatosplenomegaly.     Tenderness: There is no abdominal tenderness. There is no guarding or rebound. Negative signs include Murphy's sign and McBurney's sign.     Hernia: No hernia is present.  Musculoskeletal:        General: Normal range of motion.     Cervical back: Normal range of motion and neck supple.  Skin:    General: Skin is warm, dry and intact.     Findings: No rash.     Nails: There is no cyanosis.  Neurological:     Mental Status: He is alert and oriented to person, place, and time.     GCS: GCS eye subscore is 4. GCS verbal subscore is 5. GCS motor subscore is 6.     Cranial Nerves: No cranial nerve deficit.     Sensory: No sensory deficit.     Coordination: Coordination normal.     Deep Tendon Reflexes: Strength normal.  Psychiatric:        Mood and Affect: Mood and affect normal.        Speech: Speech normal.        Behavior: Behavior normal.        Thought Content: Thought content normal.     ED Results / Procedures / Treatments   Labs (all labs ordered are listed, but only abnormal results are displayed) Labs Reviewed  COMPREHENSIVE METABOLIC PANEL - Abnormal; Notable for the following components:      Result Value   CO2 21 (*)    Calcium 7.6 (*)    Total Protein 4.8 (*)    Albumin 2.4 (*)    AST 11 (*)    All other components within normal limits  CBC WITH DIFFERENTIAL/PLATELET - Abnormal; Notable for the following components:   RBC 2.91 (*)    Hemoglobin 9.9 (*)    HCT 30.4 (*)    MCV 104.5 (*)    RDW 18.2 (*)    Platelets 73 (*)    Abs Immature Granulocytes 0.15 (*)    All other components within normal limits  PROTIME-INR - Abnormal; Notable for the following components:   Prothrombin Time 15.4 (*)    INR 1.3 (*)    All other components within normal limits  APTT - Abnormal; Notable for the following components:   aPTT  38 (*)    All other components within normal limits  CULTURE, BLOOD (ROUTINE X 2)  CULTURE, BLOOD (ROUTINE X 2)  URINE CULTURE  RESP PANEL BY RT-PCR (FLU A&B, COVID) ARPGX2  LACTIC ACID, PLASMA  LACTIC ACID, PLASMA  URINALYSIS, ROUTINE W REFLEX MICROSCOPIC    EKG None  Radiology DG Chest Port 1 View  Result Date: 02/15/2020 CLINICAL DATA:  Sepsis EXAM: PORTABLE CHEST 1 VIEW COMPARISON:  12/18/2019 FINDINGS: Improved aeration of the lungs. Unchanged position of right chest wall Port-A-Cath. No pleural effusion or pneumothorax. Normal cardiomediastinal contours. IMPRESSION: Improved aeration of the lungs without new area of consolidation. Electronically Signed   By: Ulyses Jarred M.D.   On: 02/15/2020 05:32    Procedures Procedures (including critical care time)  Medications Ordered in ED Medications  lactated ringers infusion ( Intravenous New Bag/Given 02/15/20 0518)  lactated ringers bolus 1,000 mL (0 mLs Intravenous Stopped 02/15/20 0620)    And  lactated ringers bolus 1,000 mL (1,000 mLs Intravenous New Bag/Given 02/15/20 0620)    And  lactated ringers bolus 250 mL (has no administration in time range)  piperacillin-tazobactam (ZOSYN) IVPB 3.375 g (3.375 g Intravenous New Bag/Given 02/15/20 0557)  vancomycin (VANCOCIN) IVPB 1000 mg/200 mL premix (has no administration in time range)  vancomycin (VANCOCIN) IVPB 1000 mg/200 mL premix (0 mg Intravenous Stopped 02/15/20 4239)    ED Course  I have reviewed the triage vital signs and the nursing notes.  Pertinent labs & imaging results that were available during my care of the patient were reviewed by me and considered in my medical decision making (see chart for details).    MDM Rules/Calculators/A&P                          Patient presents to the emergency department for evaluation of possible sepsis.  Patient experiencing severe weakness which caused him to have a fall.  No injuries from the fall.  Patient hypotensive at arrival.   He had already received some fluids by EMS, fluid resuscitation was continued.  Blood pressure is improved.  Patient was somewhat somnolent at arrival, mentation has improved as his blood pressure has improved.  Patient was thought to be septic at arrival because of fever and low blood pressure.  He was initiated on broad-spectrum antibiotics.  He reports recent treatment for pneumonia and UTI.  Chest x-ray shows clearing pneumonia.  He has not put out any urine yet, will cath him.  Most recent urine culture was Pseudomonas.  CRITICAL CARE Performed by: Orpah Greek   Total critical care time: 30 minutes  Critical care time was exclusive of separately billable procedures and treating other patients.  Critical care was necessary to treat or prevent imminent or life-threatening deterioration.  Critical care was time spent personally by me on the following activities: development of treatment plan with patient and/or surrogate as well as nursing, discussions with consultants, evaluation of patient's response to treatment, examination of patient, obtaining history from patient or surrogate, ordering and performing treatments and interventions, ordering and review of laboratory studies, ordering and review of radiographic studies, pulse oximetry and re-evaluation of patient's condition.  Final Clinical Impression(s) / ED Diagnoses Final diagnoses:  Acute encephalopathy    Rx / DC Orders ED Discharge Orders    None       Wyona Neils, Gwenyth Allegra, MD 02/15/20 [phone]

## 2020-02-15 NOTE — ED Notes (Signed)
Pt again hit call light. This RN to room. Pt states he needs to use restroom. Told pt will get bedside commode. Pt reports "absolutely not. I will not use that." Tried to educate pt again cussing at nurse and stating "screw you. Leave."

## 2020-02-15 NOTE — ED Notes (Signed)
Pt hit call light again requesting bedside table. Pt again sitting up on side of bed and unhooked self from monitor. Pt refusing to be hooked back to cardiac monitor.

## 2020-02-15 NOTE — H&P (Signed)
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 Per chart review he has recently been treated for pneumonia and uti's.  Pt was mildly altered on presentation and not responsive to fluids initially. He was started on low dose pressors, empiric abx and CCM has been asked to admit.   Upon my exam pt is angry yelling about staff and the fact he has been in the ER for over 4 hours. He is demanding a third person come to do sttriaght cath (two people before he states are not allowed back in). He is refusing to answer questions stating he "doesn't feel like".   Multiple occasions had to ask him to calm and speak to staff with respect. He cont to refuse care despite multiple reassurances we were all trying to help. He appears oriented just angry   Past Medical History   Past Medical History:  Diagnosis Date  . Anxiety 01/30/2014  . Back injury    lower disc  . CAD (coronary artery disease)   . CLL (chronic lymphocytic leukemia) ([place]) 03/18/2011  . COPD (chronic obstructive pulmonary disease) ([place])   . Diabetes mellitus ([place])    Type 2   . ECRB (extensor carpi radialis brevis) tenosynovitis   . GERD (gastroesophageal reflux disease)    takes Nexium if needed  . Hyperlipidemia   . Lateral epicondylitis of left elbow   . MI, acute, non ST segment elevation ([place]) 06/28/2009   with stenting of the LAD  . Neuromuscular disorder ([place])    peripheral neuropathy  . PVD (peripheral vascular disease)  ([place])   . Tobacco abuse     Significant Hospital Events   1/1: admitted  Consults:    Procedures:    Significant Diagnostic Tests:    Micro Data:  1/1 blood:  1/1 urine:   Antimicrobials:  Zosyn 1/1 vanc 1/1   Interim History / Subjective:  States " feel like ass"  SpO2 was 100% on 2L -- I removed O2 and SpO2 remained 93-99%   Objective   Blood pressure (!) 87/43, pulse 81, temperature 99.6 F (37.6 C), temperature source Oral, resp. rate (!) 25, height 6' (1.829 m), weight 73.6 kg, SpO2 98 %.        Intake/Output Summary (Last 24 hours) at 02/15/2020 1052 Last data filed at 02/15/2020 0943 Gross per 24 hour  Intake 2285 ml  Output 1200 ml  Net 1085 ml   Filed Weights   02/15/20 0502  Weight: 73.6 kg    Examination: General: Chronically ill appearing older adult M, supine, NAD  HENT: [place]. Anicteric sclera, tacky mm  Lungs: CTA, no accessory use on RA, symmetrical chest expansion Cardiovascular: rrr s1s2. 2+ pulses Abdomen: Soft, round ndnt Extremities: No obvious joint deformity. No cyanosis or clubbing. Pedal edema  Neuro: awake/alert and appears oriented but yelling and cursing and not answering questions. no focal deficits, apparent, moving all 4  Skin: scattered petechiae, ecchymosis. Dry skin GU: foley with yellow urine with sediment, some  pus   Resolved Hospital Problem list     Assessment & Plan:   Acute encephalopathy, improving -hypoperfusion vs infection. Came as code stroke, ruled out  P -cont abx, IVF, NE   Septic Shock -suspect urinary source, does have portacath R chest, recent tx for PNA   -hx psa UTI resistant to cipro  -states recently had long term catheter, uroscopy, then noticed pus in urine P -follow cx data  -vanc, pip tazo-- narrow as able  -pressors for SBP 90 or MAP > 65, IV team to access port -cont IVF   Small cell b cell lymphoma Hx CLL in remission Hypogammaglobulinemia -gets IVIG months    Thrombocytopenia   -hx ITP, splenomegaly  -hx acquired pancytopenia  P -trend. No contraindication to dvt ppx   Chronic hypotension -home midodrine, salt liberalized, support stockings  P -midodrine - -room to increase - Ted hose   COPD, mixed type -followed by Dr. Shearon Stalls -pt states not using home BDs, not on home O2  P -SpO2 88-92, O2 as needed  -albuterol  DM -SSI  Peripheral neuropathy -holding home lyrica with acute encephalopathy   Best practice (evaluated daily)  Diet: sips with meds Pain/Anxiety/Delirium protocol (if indicated):na VAP protocol (if indicated): na DVT prophylaxis: sqh + SCD  GI prophylaxis: pepcid  Glucose control: monitor Mobility: BR Disposition:admit to ICU   Goals of Care:  Last date of multidisciplinary goals of care discussion:-- Family and staff present: pt, wife, NP Summary of discussion: Full scope full code Follow up goals of care discussion due: 1/8 Code Status: Full   Labs   CBC: Recent Labs  Lab 02/15/20 0455  WBC 6.9  NEUTROABS 5.3  HGB 9.9*  HCT 30.4*  MCV 104.5*  PLT 73*    Basic Metabolic Panel: Recent Labs  Lab 02/15/20 0455  NA 140  K 3.6  CL 111  CO2 21*  GLUCOSE 92  BUN 9  CREATININE 0.61  CALCIUM 7.6*   GFR: Estimated Creatinine Clearance: 92 mL/min (by C-G formula based on SCr of 0.61 mg/dL). Recent Labs  Lab 02/15/20 0455  WBC 6.9  LATICACIDVEN 0.7    Liver Function Tests: Recent Labs  Lab 02/15/20 0455  AST 11*  ALT 10  ALKPHOS 67  BILITOT 0.5  PROT 4.8*  ALBUMIN 2.4*   No results for input(s): LIPASE, AMYLASE in the last 168 hours. No results for input(s): AMMONIA in the last 168 hours.  ABG    Component Value Date/Time   PHART 7.541 (H) 12/15/2019 1005   PCO2ART 29.8 (L) 12/15/2019 1005   PO2ART 78 (L) 12/15/2019 1005   HCO3 25.6 12/15/2019 1005   TCO2 26 12/15/2019 1005   O2SAT 97.0 12/15/2019 1005     Coagulation Profile: Recent Labs  Lab 02/15/20 0455  INR 1.3*    Cardiac  Enzymes: No results for input(s): CKTOTAL, CKMB, CKMBINDEX, TROPONINI in the last 168 hours.  HbA1C: Hgb A1c MFr Bld  Date/Time Value Ref Range Status  11/28/2019 07:22 PM 5.1 4.8 - 5.6 % Final    Comment:    (NOTE)         Prediabetes: 5.7 - 6.4         Diabetes: >6.4         Glycemic control for adults with diabetes: <7.0   04/13/2017 08:23 AM 6.2 (H) 4.8 - 5.6 % Final    Comment:             Prediabetes: 5.7 - 6.4  Diabetes: >6.4          Glycemic control for adults with diabetes: <7.0     CBG: No results for input(s): GLUCAP in the last 168 hours.  Review of Systems:    + weakness + fatigue + BLE edema + persistent cough +pus in urine   Past Medical History:  He,  has a past medical history of Anxiety (01/30/2014), Back injury, CAD (coronary artery disease), CLL (chronic lymphocytic leukemia) ([place]) (03/18/2011), COPD (chronic obstructive pulmonary disease) ([place]), Diabetes mellitus ([place]), ECRB (extensor carpi radialis brevis) tenosynovitis, GERD (gastroesophageal reflux disease), Hyperlipidemia, Lateral epicondylitis of left elbow, MI, acute, non ST segment elevation ([place]) (06/28/2009), Neuromuscular disorder ([place]), PVD (peripheral vascular disease) ([place]), and Tobacco abuse.   Surgical History:   Past Surgical History:  Procedure Laterality Date  . ADENOIDECTOMY  1955  . CARDIAC CATHETERIZATION    . CARDIAC CATHETERIZATION N/A 09/26/2014   Procedure: Left Heart Cath and Coronary Angiography;  Surgeon: Peter M Martinique, MD;  Location: [facility];  Service: Cardiovascular;  Laterality: N/A;  . carpel tunnel release Left 04-1989  . carpel tunnel release  Right 01-1989  . CHOLECYSTECTOMY  2007  . CORONARY STENT PLACEMENT  May 2011  . femoral stents    . IR CV LINE INJECTION  08/18/2017  . IR CV LINE INJECTION  09/01/2017  . IR CV LINE INJECTION  02/02/2018  . LATERAL EPICONDYLE RELEASE Left 02/12/2014   Procedure: LEFT ELBOW DEBRIDEMENT WITH TENDON REPAIR ;   Surgeon: Lorn Junes, MD;  Location: [facility];  Service: Orthopedics;  Laterality: Left;  . LEFT CAI STENT/PTA AND POPLITEAL ARTERY/TIBIAL THROMBECTOMY     . LEFT HEART CATHETERIZATION WITH CORONARY ANGIOGRAM N/A 08/26/2011   Procedure: LEFT HEART CATHETERIZATION WITH CORONARY ANGIOGRAM;  Surgeon: Peter M Martinique, MD;  [facility];  Service: Cardiovascular;  Laterality: N/A;  . PERIPHERAL VASCULAR CATHETERIZATION N/A 01/01/2015   Procedure: Abdominal Aortogram;  Surgeon: Conrad Pollock Pines, MD;  Location: [facility];  Service: Cardiovascular;  Laterality: N/A;  . TARSAL TUNNEL RELEASE Bilateral 08-2007     Social History:   reports that he quit smoking about 3 years ago. His smoking use included cigarettes. He has a 44.00 pack-year smoking history. He has quit using smokeless tobacco. He reports that he does not drink alcohol and does not use drugs.   Family History:  His family history includes Cancer in his mother; Heart disease in his father; Heart failure (age of onset: 23) in his father; Lung cancer (age of onset: 61) in his mother.   Allergies Allergies  Allergen Reactions  . Immune Globulin Hives and Other (See Comments)    Pt reports hives/rash on body with IV Privigen. He was changed to IV Gamunex-C and had many infusions without adverse side effects. Pt reports hives/rash on body with IV Privigen. He was changed to IV Gamunex-C and had many infusions without adverse side effects.  . Codeine Hives    Pt states he can take a few, more reaction with extended doses.     Home Medications  Prior to Admission medications   Medication Sig Start Date End Date Taking? Authorizing Provider  acyclovir (ZOVIRAX) 400 MG tablet Take 1 tablet (400 mg total) by mouth 2 (two) times daily. 01/08/20   [name], MD  albuterol (PROVENTIL HFA) 108 (90 Base) MCG/ACT inhaler TAKE 2 PUFFS BY MOUTH EVERY 6 HOURS AS NEEDED FOR WHEEZE OR SHORTNESS OF BREATH 06/27/19   Shearon Stalls,  Senaida Ores, MD   allopurinol (ZYLOPRIM) 300 MG tablet TAKE 1 TABLET BY MOUTH EVERY DAY 09/23/19   [name], MD  ALPRAZolam Duanne Moron) 0.5 MG tablet Take 0.5 mg by mouth as needed for anxiety.    [provider]  b complex vitamins tablet Take 1 tablet by mouth daily.     [provider]  benzonatate (TESSALON) 100 MG capsule TAKE TWO CAPSULES BY MOUTH THREE TIMES A DAY AS NEEDED COUGH 01/10/20   Spero Geralds, MD  CALQUENCE 100 MG capsule TAKE 100 MG BY MOUTH 2 (TWO) TIMES DAILY. 04/11/19   [name], MD  Cholecalciferol 25 MCG (1000 UT) tablet Take 1,000 Units by mouth daily.     [provider]  famotidine (PEPCID) 20 MG tablet TAKE 1 TABLET BY MOUTH TWICE A DAY 02/11/19   Gorsuch, Ni, MD  fluticasone furoate-vilanterol (BREO ELLIPTA) 200-25 MCG/INH AEPB Inhale 1 puff into the lungs daily. 06/27/19   Spero Geralds, MD  HYDROcodone-acetaminophen [facility]) 10-325 MG tablet Take 1 tablet by mouth See admin instructions. Every 6 to 8 hours as needed for pain 04/14/15   [provider]  lidocaine-prilocaine (EMLA) cream APPLY TO AFFECTED AREA ONCE 12/21/17   Nicholas Lose, MD  loperamide (IMODIUM) 2 MG capsule TAKE 2 CAPSULES (4 MG TOTAL) BY MOUTH EVERY 6 (SIX) HOURS AS NEEDED FOR DIARRHEA OR LOOSE STOOLS. 01/28/20   [name], MD  meclizine (ANTIVERT) 12.5 MG tablet Take 12.5 mg by mouth every 6 (six) hours as needed for dizziness.  12/24/14   [provider]  metFORMIN (GLUCOPHAGE-XR) 500 MG 24 hr tablet Take 500-1,000 mg by mouth See admin instructions. Takes one tablet (500mg )  in AM and 2 tablets (1000mg ) at night.    [provider]  midodrine (PROAMATINE) 5 MG tablet Take 1 tablet (5 mg total) by mouth 3 (three) times daily with meals. 01/15/20   Burtis Junes, NP  mometasone (NASONEX) 50 MCG/ACT nasal spray Place 2 sprays into the nose daily. 11/08/19   Spero Geralds, MD  montelukast (SINGULAIR) 10 MG tablet TAKE 1 TABLET BY MOUTH EVERY DAY 12/11/19    Spero Geralds, MD  nitroGLYCERIN (NITROSTAT) 0.4 MG SL tablet PLACE 1 TABLET UNDER THE TONGUE EVERY 5 MINUTES X 3 DOSES AS NEEDED FOR CHEST PAIN *MAX 3 DOSES* 10/03/19   Burtis Junes, NP  pregabalin (LYRICA) 200 MG capsule TAKE 1 CAPSULE (200 MG TOTAL) BY MOUTH 3 (THREE) TIMES DAILY. 09/02/19   Suzzanne Cloud, NP  rosuvastatin (CRESTOR) 5 MG tablet TAKE 1 TABLET BY MOUTH EVERY DAY 07/24/19   Burtis Junes, NP  tamsulosin (FLOMAX) 0.4 MG CAPS capsule Take 1 capsule (0.4 mg total) by mouth daily. 12/06/19   Charlynne Cousins, MD  zolpidem (AMBIEN) 10 MG tablet Take 10 mg by mouth at bedtime as needed for sleep.    [provider]     Critical care time: The patient is critically ill with multiple organ systems failure and requires high complexity decision making for assessment and support, frequent evaluation and titration of therapies, application of advanced monitoring technologies and extensive interpretation of multiple databases.  Critical care time 39 mins. This represents my time independent of the NPs time taking care of the pt. This is excluding procedures.    [facility] 02/15/2020, 12:53 PM

## 2020-02-15 NOTE — ED Notes (Signed)
Pt continually reusing same straight cath tube. Tried to educate pt. Pt refusing education. Multiple straight cath kits have been given to pt.

## 2020-02-15 NOTE — Progress Notes (Signed)
PCCM Brief progress note  Pt admitted to PCCM due to hypotension in setting of sepsis.  Pt lethargy improving, is awake alert and oriented - though very hostile toward staff.   SBP 118 off of levophed    Will downgrade admission order to Progressive. Will change mobility to "with assist"  Will ask TRH to take over care 1/2  I have recommended against leaving AMA, patient is still considering this.   Eliseo Gum MSN, AGACNP-BC [place] [phone] If no answer, [phone] 02/15/2020, 1:47 PM

## 2020-02-15 NOTE — Progress Notes (Signed)
Request to get some pain medication just at site of urethral catheter insertion  Has had multiple catheters in past as well and I am told the site itself looks fine  Only has pain at insertion site, foley is draining very well Will try topical lido gel

## 2020-02-15 NOTE — Progress Notes (Signed)
Pharmacy Antibiotic Note  Ryan Copeland is a 70 y.o. male admitted on 02/15/2020 with sepsis.  Pharmacy has been consulted for Vancomycin dosing. Renal function good. Pt has lymphoma.   Plan: Vancomycin 1000 mg IV q12h Zosyn x 1 in the ED, f/u additional gram negative coverage  Trend WBC, temp, renal function  F/U infectious work-up Drug levels as indicated   Height: 6' (182.9 cm) Weight: 73.6 kg (162 lb 4.1 oz) IBW/kg (Calculated) : 77.6  Temp (24hrs), Avg:102.3 F (39.1 C), Min:102.3 F (39.1 C), Max:102.3 F (39.1 C)  Recent Labs  Lab 02/15/20 0455  CREATININE 0.61  LATICACIDVEN 0.7    Estimated Creatinine Clearance: 92 mL/min (by C-G formula based on SCr of 0.61 mg/dL).    Allergies  Allergen Reactions  . Immune Globulin Hives and Other (See Comments)    Pt reports hives/rash on body with IV Privigen. He was changed to IV Gamunex-C and had many infusions without adverse side effects. Pt reports hives/rash on body with IV Privigen. He was changed to IV Gamunex-C and had many infusions without adverse side effects.  . Codeine Hives    Pt states he can take a few, more reaction with extended doses.    Narda Bonds, PharmD, BCPS Clinical Pharmacist Phone: [phone]

## 2020-02-16 ENCOUNTER — Inpatient Hospital Stay (HOSPITAL_COMMUNITY): Payer: Medicare Other

## 2020-02-16 DIAGNOSIS — I9589 Other hypotension: Secondary | ICD-10-CM

## 2020-02-16 DIAGNOSIS — R6521 Severe sepsis with septic shock: Secondary | ICD-10-CM

## 2020-02-16 DIAGNOSIS — A419 Sepsis, unspecified organism: Secondary | ICD-10-CM

## 2020-02-16 DIAGNOSIS — N390 Urinary tract infection, site not specified: Secondary | ICD-10-CM | POA: Diagnosis not present

## 2020-02-16 DIAGNOSIS — R3989 Other symptoms and signs involving the genitourinary system: Secondary | ICD-10-CM

## 2020-02-16 DIAGNOSIS — A4152 Sepsis due to Pseudomonas: Secondary | ICD-10-CM

## 2020-02-16 LAB — GLUCOSE, CAPILLARY
Glucose-Capillary: 145 mg/dL — ABNORMAL HIGH (ref 70–99)
Glucose-Capillary: 155 mg/dL — ABNORMAL HIGH (ref 70–99)
Glucose-Capillary: 70 mg/dL (ref 70–99)
Glucose-Capillary: 84 mg/dL (ref 70–99)
Glucose-Capillary: 92 mg/dL (ref 70–99)

## 2020-02-16 MED ORDER — ZOLPIDEM TARTRATE 5 MG PO TABS
10.0000 mg | ORAL_TABLET | Freq: Every day | ORAL | Status: DC
  Administered 2020-02-17 – 2020-02-18 (×3): 10 mg via ORAL
  Filled 2020-02-16 (×3): qty 2

## 2020-02-16 MED ORDER — HYDROCODONE-ACETAMINOPHEN 10-325 MG PO TABS
1.0000 | ORAL_TABLET | Freq: Four times a day (QID) | ORAL | Status: DC | PRN
  Administered 2020-02-16 – 2020-02-19 (×11): 1 via ORAL
  Filled 2020-02-16 (×11): qty 1

## 2020-02-16 NOTE — Plan of Care (Signed)
Pt is alert and oriented X4. Irritable at times. Able to communicate needs. Skin warm and intact. No respiratory distress noted. Call bell within reach. Will continue monitoring.

## 2020-02-16 NOTE — Progress Notes (Signed)
[facility]  PROGRESS NOTE  Ryan Copeland IOX:[id number] DOB: 02-Oct-1950 DOA: 02/15/2020 PCP: [name]., PA-C Admit date - 02/15/2020   Admitting Physician Audria Nine, DO  Outpatient Primary MD for the patient is [name]., PA-C  LOS - 1 Brief Narrative   Ryan Copeland is a 70 y.o. year old male with medical history significant for Small B-cell lymphoma of multiple sites, on IVIG monthly for hypogammaglobulinemia, prior hospitalization on 12/20/2019 for septic shock presumed secondary to UTI from Pseudomonas requiring cefepime who presented from home with hypotension (73/49 per EMS) and increased lethargy and was admitted with working diagnosis of septic shock secondary to presumed urinary source. Was later found to have pseudomonal UTI in setting of recent urinary catheterization (patient has chronic indwelling Foley for urinary retention that was removed a few days prior to admission, and patient was doing straight caths at home).  Discussed with wife prior to admission he had to go back to doing self catheterizations and noticed pus and blood at the catheter site followed by low blood pressure and poor mentation  Hospital course initially required management in ICU on Levophed given septic shock. Patient was continued on empiric vancomycin and Zosyn was able to transition off pressor support to progressive unit under [place] service on 02/16/2020.  Urine culture returned positive for Pseudomonas aeruginosa on 02/16/2020. Patient also reports he recently had his Foley catheter placed for urinary retention during a prior hospitalization removed as an outpatient by his urologist a few days prior to this admission.   Subjective   This morning he states he has some bladder fullness and pain despite Foley catheter in place. Has cough but that is improved from prior pneumonia admission. Denies any fevers. No chest pain.  A & P   Septic shock secondary to pseudomonal UTI in the  setting of recent urinary catheterization, prior to admission. Blood pressure remains at goal off pressors, afebrile, normal WBC, no growth on blood cultures x1 day. History of similar presentation from pseudomonal UTI that required cefepime (12/20/2019, resistant to cipro, sensitive to zosyn) -Await urine culture sensitivities, continue Zosyn -Discontinue vancomycin -Continue Foley catheter placed during this admission, closely monitor output  Chronic urinary retention. Followed by urology as outpatient ( did outpatient cystoscopy on 12/28 and had foley removed with instruction to self cath as needed)  New Foley catheter was placed during this admission. Bladder scan shows no retention.  -Continue to monitor output if any issues may warrant inpatient urology(followed by Dr. April Holding otherwise continue follow-up with outpatient  Bladder and penile pain. Reports tenderness at left lower quadrant concerning for bladder fullness there. No external issues noted on my exam. Bladder scan unremarkable with no retention. Could be related to ongoing infection but pain very severe on exam though no signs of acute distress. Other DDX includes bladder stones, ascending UTI, hydronephrosis, interstitial cystitis --further evaluation with CT abd and pelvis  Chronic hypotension, stable. SBP's in the 90s. Remains asymptomatic -Continue midodrine  Chronic pancytopenia, multifactorial etiology including splenomegaly with history of CLL. No bleeding episodes -Monitor CBC  CLL, stable now-follows with oncology as outpatient    Family Communication  :  Updated his wife by phone on 02/16/20  Code Status :  FULL  Disposition Plan  :  Patient is from home. Anticipated d/c date: 2 to 3 days. Barriers to d/c or necessity for inpatient status: IV zosyn for UTI with pseudomonas, renal ultrasound Consults  :  none  Procedures  :  none  DVT Prophylaxis  : Heparin   MDM: The below labs and imaging reports  were reviewed and summarized above.  Medication management as above.  Lab Results  Component Value Date   PLT 73 (L) 02/15/2020    Diet :  Diet Order            Diet regular Room service appropriate? Yes; Fluid consistency: Thin  Diet effective now                  Inpatient Medications Scheduled Meds: . acyclovir  400 mg Oral BID  . Chlorhexidine Gluconate Cloth  6 each Topical Daily  . famotidine  20 mg Oral Daily  . heparin  5,000 Units Subcutaneous Q8H  . insulin aspart  0-9 Units Subcutaneous Q4H  . midodrine  5 mg Oral TID WC  . sodium chloride flush  10-40 mL Intracatheter Q12H  . zolpidem  10 mg Oral QHS   Continuous Infusions: . lactated ringers    . piperacillin-tazobactam (ZOSYN)  IV 3.375 g (02/16/20 1329)   PRN Meds:.acetaminophen, albuterol, benzonatate, docusate sodium, HYDROcodone-acetaminophen, polyethylene glycol, sodium chloride flush  Antibiotics  :   Anti-infectives (From admission, onward)   Start     Dose/Rate Route Frequency Ordered Stop   02/15/20 2200  vancomycin (VANCOCIN) IVPB 1000 mg/200 mL premix  Status:  Discontinued        1,000 mg 200 mL/hr over 60 Minutes Intravenous Every 12 hours 02/15/20 0641 02/16/20 1039   02/15/20 1400  piperacillin-tazobactam (ZOSYN) IVPB 3.375 g        3.375 g 12.5 mL/hr over 240 Minutes Intravenous Every 8 hours 02/15/20 0945     02/15/20 1000  acyclovir (ZOVIRAX) 200 MG capsule 400 mg        400 mg Oral 2 times daily 02/15/20 0935     02/15/20 0500  vancomycin (VANCOCIN) IVPB 1000 mg/200 mL premix        1,000 mg 200 mL/hr over 60 Minutes Intravenous  Once 02/15/20 0456 02/15/20 0655   02/15/20 0500  piperacillin-tazobactam (ZOSYN) IVPB 3.375 g        3.375 g 12.5 mL/hr over 240 Minutes Intravenous  Once 02/15/20 0456 02/15/20 1055       Objective   Vitals:   02/16/20 0729 02/16/20 1145 02/16/20 1149 02/16/20 1523  BP: (!) 97/54 107/66  (!) 99/56  Pulse: 74 61 63 (!) 58  Resp: 16 18 16    Temp:  (!) 97.5 F (36.4 C) 97.8 F (36.6 C)  98.5 F (36.9 C)  TempSrc: Oral Oral  Oral  SpO2: 95% 96% 94% 95%  Weight:      Height:        SpO2: 95 % O2 Flow Rate (L/min): 2 L/min FiO2 (%): 24 %  Wt Readings from Last 3 Encounters:  02/15/20 73.6 kg  02/06/20 73.6 kg  01/28/20 73.1 kg     Intake/Output Summary (Last 24 hours) at 02/16/2020 1604 Last data filed at 02/16/2020 1456 Gross per 24 hour  Intake 370 ml  Output 2200 ml  Net -1830 ml    Physical Exam:  Awake Alert, Oriented X 3, Normal affect No new F.N deficits,  [place].AT, Normal respiratory effort on room air, CTAB RRR,No Gallops,Rubs or new Murmurs,  +ve B.Sounds, Abd Soft, mildly tender in suprapubic region, No obvious flank pain, no rebound tenderness orguarding Foley in place draining clear, yellow urine  I have personally reviewed the following:   Data Reviewed:  CBC  Recent Labs  Lab 02/15/20 0455  WBC 6.9  HGB 9.9*  HCT 30.4*  PLT 73*  MCV 104.5*  MCH 34.0  MCHC 32.6  RDW 18.2*  LYMPHSABS 0.9  MONOABS 0.6  EOSABS 0.0  BASOSABS 0.0    Chemistries  Recent Labs  Lab 02/15/20 0455  NA 140  K 3.6  CL 111  CO2 21*  GLUCOSE 92  BUN 9  CREATININE 0.61  CALCIUM 7.6*  AST 11*  ALT 10  ALKPHOS 67  BILITOT 0.5   ------------------------------------------------------------------------------------------------------------------ No results for input(s): CHOL, HDL, LDLCALC, TRIG, CHOLHDL, LDLDIRECT in the last 72 hours.  Lab Results  Component Value Date   HGBA1C 4.5 (L) 02/15/2020   ------------------------------------------------------------------------------------------------------------------ No results for input(s): TSH, T4TOTAL, T3FREE, THYROIDAB in the last 72 hours.  Invalid input(s): FREET3 ------------------------------------------------------------------------------------------------------------------ No results for input(s): VITAMINB12, FOLATE, FERRITIN, TIBC, IRON, RETICCTPCT in  the last 72 hours.  Coagulation profile Recent Labs  Lab 02/15/20 0455  INR 1.3*    No results for input(s): DDIMER in the last 72 hours.  Cardiac Enzymes No results for input(s): CKMB, TROPONINI, MYOGLOBIN in the last 168 hours.  Invalid input(s): CK ------------------------------------------------------------------------------------------------------------------    Component Value Date/Time   BNP 146.4 (H) 02/15/2020 0503    Micro Results Recent Results (from the past 240 hour(s))  Blood Culture (routine x 2)     Status: None (Preliminary result)   Collection Time: 02/15/20  5:43 AM   Specimen: BLOOD LEFT WRIST  Result Value Ref Range Status   Specimen Description BLOOD LEFT WRIST  Final   Special Requests   Final    BOTTLES DRAWN AEROBIC AND ANAEROBIC Blood Culture adequate volume   Culture   Final    NO GROWTH 1 DAY Performed at [facility], [address], [place], [place] [address]    Report Status PENDING  Incomplete  Blood Culture (routine x 2)     Status: None (Preliminary result)   Collection Time: 02/15/20  5:44 AM   Specimen: BLOOD RIGHT HAND  Result Value Ref Range Status   Specimen Description BLOOD RIGHT HAND  Final   Special Requests   Final    BOTTLES DRAWN AEROBIC ONLY Blood Culture adequate volume   Culture   Final    NO GROWTH 1 DAY Performed at [facility], [place] [address] [place], [place] [address]    Report Status PENDING  Incomplete  Urine culture     Status: Abnormal (Preliminary result)   Collection Time: 02/15/20  7:13 AM   Specimen: In/Out Cath Urine  Result Value Ref Range Status   Specimen Description IN/OUT CATH URINE  Final   Special Requests NONE  Final   Culture (A)  Final    >=100,000 COLONIES/mL PSEUDOMONAS AERUGINOSA SUSCEPTIBILITIES TO FOLLOW Performed at [facility], [place] [address], [place], [place] [address]    Report Status PENDING  Incomplete  Resp Panel by RT-PCR (Flu A&B, Covid) Nasopharyngeal  Swab     Status: None   Collection Time: 02/15/20  7:13 AM   Specimen: Nasopharyngeal Swab; Nasopharyngeal(NP) swabs in vial transport medium  Result Value Ref Range Status   SARS Coronavirus 2 by RT PCR NEGATIVE NEGATIVE Final    Comment: (NOTE) SARS-CoV-2 target nucleic acids are NOT DETECTED.  The SARS-CoV-2 RNA is generally detectable in upper respiratory specimens during the acute phase of infection. The lowest concentration of SARS-CoV-2 viral copies this assay can detect is 138 copies/mL. A negative  result does not preclude SARS-Cov-2 infection and should not be used as the sole basis for treatment or other patient management decisions. A negative result may occur with  improper specimen collection/handling, submission of specimen other than nasopharyngeal swab, presence of viral mutation(s) within the areas targeted by this assay, and inadequate number of viral copies(<138 copies/mL). A negative result must be combined with clinical observations, patient history, and epidemiological information. The expected result is Negative.  Fact Sheet for Patients:  EntrepreneurPulse.com.au  Fact Sheet for Healthcare Providers:  IncredibleEmployment.be  This test is no t yet approved or cleared by the [place] FDA and  has been authorized for detection and/or diagnosis of SARS-CoV-2 by FDA under an Emergency Use Authorization (EUA). This EUA will remain  in effect (meaning this test can be used) for the duration of the COVID-19 declaration under Section 564(b)(1) of the Act, 21 U.S.C.section 360bbb-3(b)(1), unless the authorization is terminated  or revoked sooner.       Influenza A by PCR NEGATIVE NEGATIVE Final   Influenza B by PCR NEGATIVE NEGATIVE Final    Comment: (NOTE) The Xpert Xpress SARS-CoV-2/FLU/RSV plus assay is intended as an aid in the diagnosis of influenza from Nasopharyngeal swab specimens and should not be used as a sole  basis for treatment. Nasal washings and aspirates are unacceptable for Xpert Xpress SARS-CoV-2/FLU/RSV testing.  Fact Sheet for Patients: EntrepreneurPulse.com.au  Fact Sheet for Healthcare Providers: IncredibleEmployment.be  This test is not yet approved or cleared by the [place] FDA and has been authorized for detection and/or diagnosis of SARS-CoV-2 by FDA under an Emergency Use Authorization (EUA). This EUA will remain in effect (meaning this test can be used) for the duration of the COVID-19 declaration under Section 564(b)(1) of the Act, 21 U.S.C. section 360bbb-3(b)(1), unless the authorization is terminated or revoked.  Performed at [facility], [place] [address], [place], [place] [address]     Radiology Reports DG Chest [place] 1 View  Result Date: 02/15/2020 CLINICAL DATA:  Sepsis EXAM: PORTABLE CHEST 1 VIEW COMPARISON:  12/18/2019 FINDINGS: Improved aeration of the lungs. Unchanged position of right chest wall Port-A-Cath. No pleural effusion or pneumothorax. Normal cardiomediastinal contours. IMPRESSION: Improved aeration of the lungs without new area of consolidation. Electronically Signed   By: Ulyses Jarred M.D.   On: 02/15/2020 05:32     Time Spent in minutes  30     Desiree Hane M.D on 02/16/2020 at 4:04 PM  To page go to [email or web address] - password [facility]

## 2020-02-17 ENCOUNTER — Telehealth: Payer: Self-pay | Admitting: Nurse Practitioner

## 2020-02-17 ENCOUNTER — Other Ambulatory Visit: Payer: Self-pay

## 2020-02-17 ENCOUNTER — Encounter (HOSPITAL_COMMUNITY): Payer: Self-pay | Admitting: Critical Care Medicine

## 2020-02-17 DIAGNOSIS — N4889 Other specified disorders of penis: Secondary | ICD-10-CM

## 2020-02-17 DIAGNOSIS — R3989 Other symptoms and signs involving the genitourinary system: Secondary | ICD-10-CM | POA: Diagnosis not present

## 2020-02-17 DIAGNOSIS — G9340 Encephalopathy, unspecified: Secondary | ICD-10-CM | POA: Diagnosis not present

## 2020-02-17 DIAGNOSIS — E119 Type 2 diabetes mellitus without complications: Secondary | ICD-10-CM

## 2020-02-17 DIAGNOSIS — N390 Urinary tract infection, site not specified: Secondary | ICD-10-CM | POA: Diagnosis not present

## 2020-02-17 LAB — CBC WITH DIFFERENTIAL/PLATELET
Abs Immature Granulocytes: 0.06 10*3/uL (ref 0.00–0.07)
Basophils Absolute: 0 10*3/uL (ref 0.0–0.1)
Basophils Relative: 0 %
Eosinophils Absolute: 0 10*3/uL (ref 0.0–0.5)
Eosinophils Relative: 1 %
HCT: 32.3 % — ABNORMAL LOW (ref 39.0–52.0)
Hemoglobin: 10.2 g/dL — ABNORMAL LOW (ref 13.0–17.0)
Immature Granulocytes: 1 %
Lymphocytes Relative: 9 %
Lymphs Abs: 0.4 10*3/uL — ABNORMAL LOW (ref 0.7–4.0)
MCH: 32.6 pg (ref 26.0–34.0)
MCHC: 31.6 g/dL (ref 30.0–36.0)
MCV: 103.2 fL — ABNORMAL HIGH (ref 80.0–100.0)
Monocytes Absolute: 0.3 10*3/uL (ref 0.1–1.0)
Monocytes Relative: 7 %
Neutro Abs: 3.5 10*3/uL (ref 1.7–7.7)
Neutrophils Relative %: 82 %
Platelets: 71 10*3/uL — ABNORMAL LOW (ref 150–400)
RBC: 3.13 MIL/uL — ABNORMAL LOW (ref 4.22–5.81)
RDW: 18.1 % — ABNORMAL HIGH (ref 11.5–15.5)
WBC: 4.3 10*3/uL (ref 4.0–10.5)
nRBC: 0 % (ref 0.0–0.2)

## 2020-02-17 LAB — GLUCOSE, CAPILLARY
Glucose-Capillary: 114 mg/dL — ABNORMAL HIGH (ref 70–99)
Glucose-Capillary: 134 mg/dL — ABNORMAL HIGH (ref 70–99)
Glucose-Capillary: 72 mg/dL (ref 70–99)
Glucose-Capillary: 89 mg/dL (ref 70–99)
Glucose-Capillary: 95 mg/dL (ref 70–99)
Glucose-Capillary: 97 mg/dL (ref 70–99)

## 2020-02-17 LAB — BASIC METABOLIC PANEL
Anion gap: 10 (ref 5–15)
BUN: 7 mg/dL — ABNORMAL LOW (ref 8–23)
CO2: 25 mmol/L (ref 22–32)
Calcium: 8.4 mg/dL — ABNORMAL LOW (ref 8.9–10.3)
Chloride: 109 mmol/L (ref 98–111)
Creatinine, Ser: 0.69 mg/dL (ref 0.61–1.24)
GFR, Estimated: >60 mL/min (ref >60)
Glucose, Bld: 104 mg/dL — ABNORMAL HIGH (ref 70–99)
Potassium: 3.2 mmol/L — ABNORMAL LOW (ref 3.5–5.1)
Sodium: 144 mmol/L (ref 135–145)

## 2020-02-17 LAB — URINE CULTURE: Culture: >=100000 — AB

## 2020-02-17 MED ORDER — BELLADONNA ALKALOIDS-OPIUM 16.2-30 MG RE SUPP
1.0000 | Freq: Once | RECTAL | Status: AC
  Administered 2020-02-17: 1 via RECTAL
  Filled 2020-02-17: qty 1

## 2020-02-17 MED ORDER — POTASSIUM CHLORIDE CRYS ER 20 MEQ PO TBCR
40.0000 meq | EXTENDED_RELEASE_TABLET | Freq: Once | ORAL | Status: AC
  Administered 2020-02-17: 40 meq via ORAL
  Filled 2020-02-17: qty 2

## 2020-02-17 MED ORDER — OXYBUTYNIN CHLORIDE 5 MG PO TABS
10.0000 mg | ORAL_TABLET | Freq: Three times a day (TID) | ORAL | Status: DC
  Administered 2020-02-17 – 2020-02-18 (×5): 10 mg via ORAL
  Filled 2020-02-17 (×5): qty 2

## 2020-02-17 MED ORDER — TAMSULOSIN HCL 0.4 MG PO CAPS
0.4000 mg | ORAL_CAPSULE | Freq: Every day | ORAL | Status: DC
Start: 2020-02-17 — End: 2020-02-20
  Administered 2020-02-17 – 2020-02-19 (×3): 0.4 mg via ORAL
  Filled 2020-02-17 (×3): qty 1

## 2020-02-17 MED FILL — CALQUENCE 100 MG CAPSULE: 100 | 30 days supply | Qty: 60 | Fill #11

## 2020-02-17 NOTE — Progress Notes (Signed)
PROGRESS NOTE  Ryan Copeland IRW:[id number] DOB: 08/07/1950 DOA: 02/15/2020 PCP: [name]., PA-C  Brief History   Ryan Copeland is a 70 y.o. year old male with medical history significant for Small B-cell lymphoma of multiple sites, on IVIG monthly for hypogammaglobulinemia, prior hospitalization on 12/20/2019 for septic shock presumed secondary to UTI from Pseudomonas requiring cefepime who presented from home with hypotension (73/49 per EMS) and increased lethargy and was admitted with working diagnosis of septic shock secondary to presumed urinary source. Was later found to have pseudomonal UTI in setting of recent urinary catheterization (patient has chronic indwelling Foley for urinary retention that was removed a few days prior to admission, and patient was doing straight caths at home).  Discussed with wife prior to admission he had to go back to doing self catheterizations and noticed pus and blood at the catheter site followed by low blood pressure and poor mentation  Hospital course initially required management in ICU on Levophed given septic shock. Patient was continued on empiric vancomycin and Zosyn was able to transition off pressor support to progressive unit under [place] service on 02/16/2020.  Urine culture returned positive for Pseudomonas aeruginosa on 02/16/2020. Patient also reports he recently had his Foley catheter placed for urinary retention during a prior hospitalization removed as an outpatient by his urologist a few days prior to this admission.  Consultants  . None  Procedures  . None  Antibiotics   Anti-infectives (From admission, onward)   Start     Dose/Rate Route Frequency Ordered Stop   02/15/20 2200  vancomycin (VANCOCIN) IVPB 1000 mg/200 mL premix  Status:  Discontinued        1,000 mg 200 mL/hr over 60 Minutes Intravenous Every 12 hours 02/15/20 0641 02/16/20 1039   02/15/20 1400  piperacillin-tazobactam (ZOSYN) IVPB 3.375 g        3.375 g 12.5 mL/hr  over 240 Minutes Intravenous Every 8 hours 02/15/20 0945     02/15/20 1000  acyclovir (ZOVIRAX) 200 MG capsule 400 mg        400 mg Oral 2 times daily 02/15/20 0935     02/15/20 0500  vancomycin (VANCOCIN) IVPB 1000 mg/200 mL premix        1,000 mg 200 mL/hr over 60 Minutes Intravenous  Once 02/15/20 0456 02/15/20 0655   02/15/20 0500  piperacillin-tazobactam (ZOSYN) IVPB 3.375 g        3.375 g 12.5 mL/hr over 240 Minutes Intravenous  Once 02/15/20 0456 02/15/20 1055    .   Subjective  The patient is resting quietly in bed. He continues to complain of pain in his penis.  Objective   Vitals:  Vitals:   02/17/20 0734 02/17/20 1127  BP: (!) 97/54 118/60  Pulse: 70 (!) 58  Resp: 18 17  Temp: 97.9 F (36.6 C) 97.7 F (36.5 C)  SpO2: 95% 93%    Exam:  Constitutional:  . The patient is awake, alert, and oriented x 3. Mild distress from penile pain. Respiratory:  . No increased work of breathing. . No wheezes, rales, or rhonchi . No tactile fremitus Cardiovascular:  . Regular rate and rhythm . No murmurs, ectopy, or gallups. . No lateral PMI. No thrills. Abdomen:  . Abdomen is soft, non-tender, non-distended . No hernias, masses, or organomegaly . Normoactive bowel sounds.  Musculoskeletal:  . No cyanosis, clubbing, or edema Skin:  . No rashes, lesions, ulcers . palpation of skin: no induration or nodules Neurologic:  . CN  2-12 intact . Sensation all 4 extremities intact Psychiatric:  . Mental status o Mood, affect appropriate o Orientation to person, place, time  . judgment and insight appear intact   I have personally reviewed the following:   Today's Data  . Vitals, CBC, BMP  Micro Data  . Urine culture has grown out pseudomonas aeruginosa . Blood Culture x 2: No growth  Imaging  . CT abdomen and pelvis  Scheduled Meds: . acyclovir  400 mg Oral BID  . Chlorhexidine Gluconate Cloth  6 each Topical Daily  . famotidine  20 mg Oral Daily  . heparin   5,000 Units Subcutaneous Q8H  . insulin aspart  0-9 Units Subcutaneous Q4H  . midodrine  5 mg Oral TID WC  . sodium chloride flush  10-40 mL Intracatheter Q12H  . zolpidem  10 mg Oral QHS   Continuous Infusions: . lactated ringers    . piperacillin-tazobactam (ZOSYN)  IV 3.375 g (02/17/20 0657)    Active Problems:   Diabetes mellitus without complication (HCC)   Sepsis (HCC)   Chronic diastolic CHF (congestive heart failure) (HCC)   Septic shock (HCC)   Acute lower UTI   Bladder pain   Chronic hypotension   LOS: 2 days   A & P   Septic shock:   Resolved. Secondary to pseudomonal UTI  in the setting of recent urinary catheterization, prior to admission. Blood pressure remains at goal off pressors, afebrile, normal WBC, no growth on blood cultures x1 day. Urine culture positive for pseudomonas aeruginosa. Hx previous UTI from same organism that was resistant to all but zosyn. Sensitivities pending. Currently receiving zosyn. Vancomycin discontinued.  Chronic urinary retention. Followed by urology as outpatient ( did outpatient cystoscopy on 12/28 and had foley removed with instruction to self cath as needed)  New Foley catheter was placed during this admission. Patient states that he has had severe penile pain since foley placement. Bladder scan shows no retention.   Bladder and penile pain. Reports tenderness at left lower quadrant concerning for bladder fullness there. No external issues noted on my exam. Bladder scan unremarkable with no retention. Could be related to ongoing infection but pain very severe on exam though no signs of acute distress. Other DDX includes bladder stones, ascending UTI, hydronephrosis, interstitial cystitis. cT abdomen and pelvis demonstrates no cause for pain. B&O suppository ordered. Will consult urology.  Chronic hypotension, stable. SBP's in the 90s. Remains asymptomatic. Continue midodrine.  Chronic pancytopenia, multifactorial etiology including  splenomegaly with history of CLL. No bleeding episodes. Monitor CBC.  CLL, stable now-follows with oncology as outpatient  I have seen and examined this patient myself. I have spent 36 minutes in his evaluation and care.  DVT Prophylaxis  : Heparin Family Communication  :  Updated his wife by phone on 02/16/2020 Code Status :  FULL CODE Disposition Plan  :  Patient is from home. Anticipated d/c date:2 to 3 days. Barriers to d/c or necessity for inpatient status:IV zosyn for UTI with pseudomonas, renal ultrasound   Taliya Mcclard, DO [facility] Direct contact: see [email or web address]  7PM-7AM contact night coverage as above 02/17/2020, 1:43 PM  LOS: 2 days

## 2020-02-17 NOTE — Telephone Encounter (Signed)
   Primary Cardiologist: Truitt Merle, NP  Chart reviewed as part of pre-operative protocol coverage. Patient is scheduled to see Truitt Merle for close follow-up 03/10/20 with anticipated need to address preoperative status at that time according to her office note 01/15/20.   Upcoming appointment note updated to address preop at his visit 03/10/20. I will remove from the preop pool at this time.   Abigail Butts, PA-C 02/17/2020, 4:10 PM

## 2020-02-17 NOTE — Telephone Encounter (Signed)
    [facility] Pre-operative Risk Assessment    Request for surgical clearance:  1. What type of surgery is being performed? Transurethral Resection of Prostate  2. When is this surgery scheduled? TBD  3. What type of clearance is required (medical clearance vs. Pharmacy clearance to hold med vs. Both)? Medical  4. Are there any medications that need to be held prior to surgery and how long? No.  5. Practice name and name of physician performing surgery? [facility], Dr. Rexene Alberts  6. What is your office phone number [phone] ext. [phone]   7.   What is your office fax number [phone]  8.   Anesthesia type (None, local, MAC, general) ? General   Ryan Copeland 02/17/2020, 2:21 PM  _________________________________________________________________   (provider comments below)

## 2020-02-18 LAB — BASIC METABOLIC PANEL
Anion gap: 8 (ref 5–15)
BUN: 8 mg/dL (ref 8–23)
CO2: 26 mmol/L (ref 22–32)
Calcium: 8.4 mg/dL — ABNORMAL LOW (ref 8.9–10.3)
Chloride: 109 mmol/L (ref 98–111)
Creatinine, Ser: 0.7 mg/dL (ref 0.61–1.24)
GFR, Estimated: >60 mL/min (ref >60)
Glucose, Bld: 84 mg/dL (ref 70–99)
Potassium: 3.5 mmol/L (ref 3.5–5.1)
Sodium: 143 mmol/L (ref 135–145)

## 2020-02-18 LAB — HEMOGLOBIN AND HEMATOCRIT, BLOOD
HCT: 31.8 % — ABNORMAL LOW (ref 39.0–52.0)
Hemoglobin: 10.5 g/dL — ABNORMAL LOW (ref 13.0–17.0)

## 2020-02-18 LAB — GLUCOSE, CAPILLARY
Glucose-Capillary: 81 mg/dL (ref 70–99)
Glucose-Capillary: 98 mg/dL (ref 70–99)

## 2020-02-18 NOTE — Progress Notes (Signed)
Patient alert and oriented x 4. Resps even and unlabored, lung sounds clear to auscultation. Port to right upper chessed accessed and infusing with NS and antibiotics as ordered. Abdomen soft, nontender and nondistended. Patient has indwelling catheter secondary to obstruction and retention; physician came to speak about a more permanent suprapubic catheter at this point. Blood tinged urine draining to gravity; foley came out last night with balloon intact which is likely the cause for the hematuria today, rather than a side effect of his CLL. Patient complaining of pain associated with this event as well, for which he is being medicated with his PRN Norco. Patient has no other complaints at this time; call light in reach, able and encouraged to make needs known.

## 2020-02-18 NOTE — Progress Notes (Signed)
Patient accidentally removed his foley attempting to get out of bed with the bulb inflated. Moderate blood was noted. Patient appear to be confused after given Norco 1 tablet and scheduled Ambien.   Reinserted another foley size 16. Bloody urine returned.  Bed alarm placed. Will continue to monitor for patient safety.

## 2020-02-18 NOTE — Plan of Care (Signed)

## 2020-02-18 NOTE — Plan of Care (Signed)

## 2020-02-18 NOTE — Progress Notes (Signed)
Noted covering provider Md Opyd was notified of the patient's accidental removal of his foley with bloody urine noted in the reinserted foley. No additional orders given. Will continue to monitor for safety.

## 2020-02-18 NOTE — Progress Notes (Addendum)
PROGRESS NOTE  Ryan Copeland WGN:[id number] DOB: [birth year] DOA: 02/15/2020 PCP: [name]., PA-C  Brief History   Ryan Copeland is a 70 y.o. year old male with medical history significant for Small B-cell lymphoma of multiple sites, on IVIG monthly for hypogammaglobulinemia, prior hospitalization on 12/20/2019 for septic shock presumed secondary to UTI from Pseudomonas requiring cefepime who presented from home with hypotension (73/49 per EMS) and increased lethargy and was admitted with working diagnosis of septic shock secondary to presumed urinary source. Was later found to have pseudomonal UTI in setting of recent urinary catheterization (patient has chronic indwelling Foley for urinary retention that was removed a few days prior to admission, and patient was doing straight caths at home).  Discussed with wife prior to admission he had to go back to doing self catheterizations and noticed pus and blood at the catheter site followed by low blood pressure and poor mentation   Hospital course initially required management in ICU on Levophed given septic shock. Patient was continued on empiric vancomycin and Zosyn was able to transition off pressor support to progressive unit under [facility] service on 02/16/2020.   Urine culture returned positive for Pseudomonas aeruginosa on 02/16/2020. Patient also reports he recently had his Foley catheter replaced for urinary retention during a prior hospitalization removed as an outpatient by his urologist a few days prior to this admission.  Due to patient's ongoing issues with penile pain related to his catheter, Dr. Abner Greenspan, his urologist plans to place suprapubic catheter as outpatient per patient.  Consultants  . None  Procedures  . None  Antibiotics   . Anti-infectives (From admission, onward)   .  Marland Kitchen Start .   .   Zada Girt . Route . Frequency . Ordered . Stop  .  Marland Kitchen 02/15/20 2200 .  Marland Kitchen vancomycin (VANCOCIN) IVPB 1000 mg/200 mL premix  Status:   Discontinued      .   . 1,000 mg . 200 mL/hr over 60 Minutes . Intravenous . Every 12 hours . 02/15/20 7846 . 02/16/20 1039  .  Marland Kitchen 02/15/20 1400 .  . piperacillin-tazobactam (ZOSYN) IVPB 3.375 g      .   . 3.375 g . 12.5 mL/hr over 240 Minutes . Intravenous . Every 8 hours . 02/15/20 0945 .    .  . 02/15/20 1000 .  Marland Kitchen acyclovir (ZOVIRAX) 200 MG capsule 400 mg      .   Marland Kitchen 400 mg . Oral . 2 times daily . 02/15/20 0935 .    .  . 02/15/20 0500 .  Marland Kitchen vancomycin (VANCOCIN) IVPB 1000 mg/200 mL premix      .   . 1,000 mg . 200 mL/hr over 60 Minutes . Intravenous .  Once . 02/15/20 9629 . 02/15/20 5284  .  Marland Kitchen 02/15/20 0500 .  . piperacillin-tazobactam (ZOSYN) IVPB 3.375 g      .   . 3.375 g . 12.5 mL/hr over 240 Minutes . Intravenous .  Once . 02/15/20 0456 . 02/15/20 1055   .   Marland Kitchen   Subjective  The patient is resting quietly in bed. He continues to complain of pain in his penis related to the catheter. Patient inadvertently pulled out his catheter yesterday without deflating bulb last night, and it had to be replaced.  Objective   Vitals:  Vitals:   02/18/20 0352 02/18/20 0834  BP: 114/73 (!) 111/56  Pulse: 61 61  Resp: 16 18  Temp: 98  F (36.7 C) 98.1 F (36.7 C)  SpO2: 98% 97%    Exam:  Constitutional:  . The patient is awake, alert, and oriented x 3. Mild distress from penile pain. Respiratory:  . No increased work of breathing. . No wheezes, rales, or rhonchi . No tactile fremitus Cardiovascular:  . Regular rate and rhythm . No murmurs, ectopy, or gallups. . No lateral PMI. No thrills. Abdomen:  . Abdomen is soft, non-tender, non-distended . No hernias, masses, or organomegaly . Normoactive bowel sounds.  Musculoskeletal:  . No cyanosis, clubbing, or edema Skin:  . No rashes, lesions, ulcers . palpation of skin: no induration or nodules Neurologic:  . CN 2-12 intact . Sensation all 4 extremities intact Psychiatric:  . Mental status o Mood, affect  appropriate o Orientation to person, place, time  . judgment and insight appear intact  I have personally reviewed the following:   Today's Data  . Vitals, CBC, BMP  Micro Data  . Urine culture has grown out pseudomonas aeruginosa . Blood Culture x 2: No growth  Imaging  . CT abdomen and pelvis  Scheduled Meds: . acyclovir  400 mg Oral BID  . Chlorhexidine Gluconate Cloth  6 each Topical Daily  . famotidine  20 mg Oral Daily  . heparin  5,000 Units Subcutaneous Q8H  . midodrine  5 mg Oral TID WC  . oxybutynin  10 mg Oral TID  . sodium chloride flush  10-40 mL Intracatheter Q12H  . tamsulosin  0.4 mg Oral QHS  . zolpidem  10 mg Oral QHS   Continuous Infusions: . lactated ringers    . piperacillin-tazobactam (ZOSYN)  IV 3.375 g (02/18/20 1433)    Active Problems:   Diabetes mellitus without complication (HCC)   Sepsis ([place])   Chronic diastolic CHF (congestive heart failure) (HCC)   Septic shock (HCC)   Acute lower UTI   Bladder pain   Chronic hypotension   LOS: 3 days   A & P   Septic shock:   Resolved. Secondary to pseudomonal UTI  in the setting of recent urinary catheterization, prior to admission. Blood pressure remains at goal off pressors, afebrile, normal WBC, no growth on blood cultures x1 day. Urine culture positive for pseudomonas aeruginosa. Hx previous UTI from same organism that was resistant to all but zosyn. Sensitivities pending. Currently receiving zosyn. Vancomycin discontinued.   Chronic urinary retention. Followed by urology as outpatient ( did outpatient cystoscopy on 12/28 and had foley removed with instruction to self cath as needed)  New Foley catheter was placed during this admission. Patient states that he has had severe penile pain since foley placement. Bladder scan shows no retention.    Bladder and penile pain. Reports tenderness at left lower quadrant concerning for bladder fullness there. No external issues noted on my exam. Bladder scan  unremarkable with no retention. Could be related to ongoing infection but pain very severe on exam though no signs of acute distress. Other DDX includes bladder stones, ascending UTI, hydronephrosis, interstitial cystitis. cT abdomen and pelvis demonstrates no cause for pain. B&O suppository ordered. Will consult urology.   Chronic hypotension, stable. SBP's in the 90s. Remains asymptomatic. Continue midodrine.   Chronic pancytopenia, multifactorial etiology including splenomegaly with history of CLL. No bleeding episodes. Monitor CBC.   CLL, stable now-follows with oncology as outpatient  I have seen and examined this patient myself. I have spent 36 minutes in his evaluation and care.   DVT Prophylaxis  : Heparin Family  Communication  :  Updated his wife by phone on 02/16/2020 Code Status :  FULL CODE Disposition Plan  :  Patient is from home. Anticipated d/c date: 2 to 3 days. Barriers to d/c or necessity for inpatient status: IV zosyn for UTI with pseudomonas, renal ultrasound   Suraya Vidrine, DO [facility] Direct contact: see [email or web address]  7PM-7AM contact night coverage as above 02/18/2020, 3:34 PM  LOS: 2 days

## 2020-02-18 NOTE — Consult Note (Signed)
Urology Consult   Physician requesting consult: Ava Swayze, DO  Reason for consult: Urinary retention, bladder pain  History of Present Illness: Ryan Copeland is a 70 y.o. seen in consultation with acute cystitis leading to septic shock and persistent bladder pain.  He was admitted on 02/15/2020, found to be hypotensive and septic from a urinary source.  Urine culture 03/05/2020 resulted 100 K Pseudomonas aeruginosa.  He initially required management in the ICU and is on Levophed for his hypotension.  He was resuscitated and weaned off of vancomycin and Zosyn and transferred to the floor on 02/16/2020.  Foley cath remains in place with clear yellow urine.  His bladder scans have been appropriately low.  CT A/P 02/16/2020 revealed perivesical stranding with no findings of urinary tract obstruction.  He complains of persistent lower abdominal pain and discomfort from his Foley catheter.  Per chart review, he did inadvertently pull out his catheter overnight with balloon still inflated.  He did note increased pain after this episode.  His urine was initially light pink however now has resolved to yellow.  He was seen in my office on 02/03/2020 to review his recent urodynamics which demonstrated an areflexic bladder. He had a bladder capacity of around 1 L with a delayed first sensation around 480 mL. He generated a weak voluntary contraction of 13-15 cm of water however was unable to void. Foley catheter was replaced.   BPH evaluation on 02/11/2020 revealed a 31 g prostate. Cystoscopy demonstrated trilobar hypertrophy with obstruction. Uroflow demonstrated average flow rate of 1.7 cc/SEC and peak flow rate of 4.4 cc/SEC with voided volume of 202 cc. His PVR was 170 mL.  Given his complaints of Foley catheter, we discussed CIC versus PT at that time.  He elected to proceed with CIC.  He states that in the days following this visit, he had significant difficulty performing CIC.  His wife attempted.  Eventually, he  was noted to drain his bladder and noted significant bladder pain with systemic symptoms as above.   Past Medical History:  Diagnosis Date  . Anxiety 01/30/2014  . Back injury    lower disc  . CAD (coronary artery disease)   . CLL (chronic lymphocytic leukemia) ([place]) 03/18/2011  . COPD (chronic obstructive pulmonary disease) ([place])   . Diabetes mellitus ([place])    Type 2   . ECRB (extensor carpi radialis brevis) tenosynovitis   . GERD (gastroesophageal reflux disease)    takes Nexium if needed  . Hyperlipidemia   . Lateral epicondylitis of left elbow   . MI, acute, non ST segment elevation ([place]) 06/28/2009   with stenting of the LAD  . Neuromuscular disorder ([place])    peripheral neuropathy  . PVD (peripheral vascular disease) ([place])   . Tobacco abuse   . UTI (urinary tract infection)     Past Surgical History:  Procedure Laterality Date  . ADENOIDECTOMY  1955  . CARDIAC CATHETERIZATION    . CARDIAC CATHETERIZATION N/A 09/26/2014   Procedure: Left Heart Cath and Coronary Angiography;  Surgeon: Peter M Martinique, MD;  Location: [facility];  Service: Cardiovascular;  Laterality: N/A;  . carpel tunnel release Left 04-1989  . carpel tunnel release  Right 01-1989  . CHOLECYSTECTOMY  2007  . CORONARY STENT PLACEMENT  May 2011  . femoral stents    . IR CV LINE INJECTION  08/18/2017  . IR CV LINE INJECTION  09/01/2017  . IR CV LINE INJECTION  02/02/2018  . LATERAL EPICONDYLE  RELEASE Left 02/12/2014   Procedure: LEFT ELBOW DEBRIDEMENT WITH TENDON REPAIR ;  Surgeon: Lorn Junes, MD;  Location: [facility];  Service: Orthopedics;  Laterality: Left;  . LEFT CAI STENT/PTA AND POPLITEAL ARTERY/TIBIAL THROMBECTOMY     . LEFT HEART CATHETERIZATION WITH CORONARY ANGIOGRAM N/A 08/26/2011   Procedure: LEFT HEART CATHETERIZATION WITH CORONARY ANGIOGRAM;  Surgeon: Peter M Martinique, MD;  Location: [facility];  Service: Cardiovascular;  Laterality: N/A;  . PERIPHERAL VASCULAR CATHETERIZATION N/A  01/01/2015   Procedure: Abdominal Aortogram;  Surgeon: Conrad Mayview, MD;  Location: [facility];  Service: Cardiovascular;  Laterality: N/A;  . TARSAL TUNNEL RELEASE Bilateral 08-2007    Medications:  Home meds:  Current Facility-Administered Medications on File Prior to Encounter  Medication Dose Route Frequency Provider Last Rate Last Admin  . 0.9 %  sodium chloride infusion   Intravenous Continuous [name], MD 50 mL/hr at 03/07/14 1005 New Bag at 03/07/14 1005  . sodium chloride 0.9 % injection 10 mL  10 mL Intracatheter PRN Marcy Panning, MD   10 mL at 08/22/12 1721   Current Outpatient Medications on File Prior to Encounter  Medication Sig Dispense Refill  . acyclovir (ZOVIRAX) 400 MG tablet Take 1 tablet (400 mg total) by mouth 2 (two) times daily. 180 tablet 1  . albuterol (PROVENTIL HFA) 108 (90 Base) MCG/ACT inhaler TAKE 2 PUFFS BY MOUTH EVERY 6 HOURS AS NEEDED FOR WHEEZE OR SHORTNESS OF BREATH (Patient taking differently: Inhale 2 puffs into the lungs every 6 (six) hours as needed for wheezing.) 18 g 11  . allopurinol (ZYLOPRIM) 300 MG tablet TAKE 1 TABLET BY MOUTH EVERY DAY (Patient taking differently: Take 300 mg by mouth daily.) 90 tablet 1  . ALPRAZolam (XANAX) 0.5 MG tablet Take 0.5 mg by mouth daily.    Marland Kitchen b complex vitamins tablet Take 1 tablet by mouth daily.     . benzonatate (TESSALON) 100 MG capsule TAKE TWO CAPSULES BY MOUTH THREE TIMES A DAY AS NEEDED COUGH (Patient taking differently: Take 100 mg by mouth daily as needed for cough.) 30 capsule 2  . CALQUENCE 100 MG capsule TAKE 100 MG BY MOUTH 2 (TWO) TIMES DAILY. (Patient taking differently: Take 100 mg by mouth 2 (two) times daily.) 60 capsule 11  . Cholecalciferol 25 MCG (1000 UT) tablet Take 1,000 Units by mouth daily.     . fluticasone furoate-vilanterol (BREO ELLIPTA) 200-25 MCG/INH AEPB Inhale 1 puff into the lungs daily. 1 each 5  . HYDROcodone-acetaminophen (NORCO) 10-325 MG tablet Take 1 tablet by  mouth every 6 (six) hours as needed for severe pain.  0  . loperamide (IMODIUM) 2 MG capsule TAKE 2 CAPSULES (4 MG TOTAL) BY MOUTH EVERY 6 (SIX) HOURS AS NEEDED FOR DIARRHEA OR LOOSE STOOLS. (Patient taking differently: Take 2 mg by mouth every 6 (six) hours as needed for diarrhea or loose stools.) 90 capsule 2  . meclizine (ANTIVERT) 12.5 MG tablet Take 12.5 mg by mouth every 6 (six) hours as needed for dizziness.   0  . metFORMIN (GLUCOPHAGE-XR) 500 MG 24 hr tablet Take 500 mg by mouth daily with breakfast.    . midodrine (PROAMATINE) 5 MG tablet Take 1 tablet (5 mg total) by mouth 3 (three) times daily with meals. 270 tablet 3  . mometasone (NASONEX) 50 MCG/ACT nasal spray Place 2 sprays into the nose daily. 1 each 11  . nitroGLYCERIN (NITROSTAT) 0.4 MG SL tablet PLACE 1 TABLET UNDER THE TONGUE  EVERY 5 MINUTES X 3 DOSES AS NEEDED FOR CHEST PAIN *MAX 3 DOSES* (Patient taking differently: Place 0.4 mg under the tongue every 5 (five) minutes as needed for chest pain (max 3 doses).) 25 tablet 3  . pregabalin (LYRICA) 200 MG capsule TAKE 1 CAPSULE (200 MG TOTAL) BY MOUTH 3 (THREE) TIMES DAILY. 270 capsule 1  . rosuvastatin (CRESTOR) 5 MG tablet TAKE 1 TABLET BY MOUTH EVERY DAY (Patient taking differently: Take 5 mg by mouth daily.) 90 tablet 3  . tamsulosin (FLOMAX) 0.4 MG CAPS capsule Take 1 capsule (0.4 mg total) by mouth daily. (Patient taking differently: Take 0.4 mg by mouth at bedtime.) 30 capsule 0  . zolpidem (AMBIEN) 10 MG tablet Take 10 mg by mouth at bedtime.    . famotidine (PEPCID) 20 MG tablet TAKE 1 TABLET BY MOUTH TWICE A DAY 180 tablet 1  . montelukast (SINGULAIR) 10 MG tablet TAKE 1 TABLET BY MOUTH EVERY DAY (Patient not taking: No sig reported) 90 tablet 1     Scheduled Meds: . acyclovir  400 mg Oral BID  . Chlorhexidine Gluconate Cloth  6 each Topical Daily  . famotidine  20 mg Oral Daily  . heparin  5,000 Units Subcutaneous Q8H  . midodrine  5 mg Oral TID WC  . oxybutynin  10  mg Oral TID  . sodium chloride flush  10-40 mL Intracatheter Q12H  . tamsulosin  0.4 mg Oral QHS  . zolpidem  10 mg Oral QHS   Continuous Infusions: . lactated ringers    . piperacillin-tazobactam (ZOSYN)  IV 3.375 g (02/18/20 1433)   PRN Meds:.acetaminophen, albuterol, benzonatate, docusate sodium, HYDROcodone-acetaminophen, polyethylene glycol, sodium chloride flush  Allergies:  Allergies  Allergen Reactions  . Immune Globulin Hives and Rash    Pt reports hives/rash on body with IV Privigen. He was changed to IV Gamunex-C and had many infusions without adverse side effects.   . Codeine Hives    Pt states he can take a few, more reaction with extended doses.    Family History  Problem Relation Age of Onset  . Lung cancer Mother 3  . Cancer Mother        lung  . Heart failure Father 43  . Heart disease Father     Social History:  reports that he quit smoking about 3 years ago. His smoking use included cigarettes. He has a 44.00 pack-year smoking history. He has quit using smokeless tobacco. He reports that he does not drink alcohol and does not use drugs.  ROS: A complete review of systems was performed.  All systems are negative except for pertinent findings as noted.  Physical Exam:  Vital signs in last 24 hours: Temp:  [97.5 F (36.4 C)-98.1 F (36.7 C)] 97.5 F (36.4 C) (01/04 1938) Pulse Rate:  [52-61] 52 (01/04 1938) Resp:  [16-18] 18 (01/04 1938) BP: (109-114)/(55-73) 114/60 (01/04 1938) SpO2:  [96 %-98 %] 98 % (01/04 1938) Weight:  [72.7 kg] 72.7 kg (01/04 0422) Constitutional:  Alert and oriented, No acute distress Cardiovascular: Regular rate and rhythm Respiratory: Normal respiratory effort, Lungs clear bilaterally GI: Abdomen is soft, nondistended, tender in the suprapubic region GU: Foley catheter remains in place with clear yellow urine Neurologic: Grossly intact, no focal deficits Psychiatric: Normal mood and affect  Laboratory Data:  Recent Labs     02/17/20 0751 02/18/20 0927  WBC 4.3  --   HGB 10.2* 10.5*  HCT 32.3* 31.8*  PLT 71*  --  Recent Labs    02/17/20 0751 02/18/20 0600  NA 144 143  K 3.2* 3.5  CL 109 109  GLUCOSE 104* 84  BUN 7* 8  CALCIUM 8.4* 8.4*  CREATININE 0.69 0.70     Results for orders placed or performed during the hospital encounter of 02/15/20 (from the past 24 hour(s))  Glucose, capillary     Status: None   Collection Time: 02/18/20 12:19 AM  Result Value Ref Range   Glucose-Capillary 98 70 - 99 mg/dL  Glucose, capillary     Status: None   Collection Time: 02/18/20  3:56 AM  Result Value Ref Range   Glucose-Capillary 81 70 - 99 mg/dL  Basic metabolic panel     Status: Abnormal   Collection Time: 02/18/20  6:00 AM  Result Value Ref Range   Sodium 143 135 - 145 mmol/L   Potassium 3.5 3.5 - 5.1 mmol/L   Chloride 109 98 - 111 mmol/L   CO2 26 22 - 32 mmol/L   Glucose, Bld 84 70 - 99 mg/dL   BUN 8 8 - 23 mg/dL   Creatinine, Ser 0.70 0.61 - 1.24 mg/dL   Calcium 8.4 (L) 8.9 - 10.3 mg/dL   GFR, Estimated >60 >60 mL/min   Anion gap 8 5 - 15  Hemoglobin and hematocrit, blood     Status: Abnormal   Collection Time: 02/18/20  9:27 AM  Result Value Ref Range   Hemoglobin 10.5 (L) 13.0 - 17.0 g/dL   HCT 31.8 (L) 39.0 - 52.0 %   *Note: Due to a large number of results and/or encounters for the requested time period, some results have not been displayed. A complete set of results can be found in Results Review.   Recent Results (from the past 240 hour(s))  Blood Culture (routine x 2)     Status: None (Preliminary result)   Collection Time: 02/15/20  5:43 AM   Specimen: BLOOD LEFT WRIST  Result Value Ref Range Status   Specimen Description BLOOD LEFT WRIST  Final   Special Requests   Final    BOTTLES DRAWN AEROBIC AND ANAEROBIC Blood Culture adequate volume   Culture   Final    NO GROWTH 3 DAYS Performed at [facility], [address], [place], [place] [address]    Report Status  PENDING  Incomplete  Blood Culture (routine x 2)     Status: None (Preliminary result)   Collection Time: 02/15/20  5:44 AM   Specimen: BLOOD RIGHT HAND  Result Value Ref Range Status   Specimen Description BLOOD RIGHT HAND  Final   Special Requests   Final    BOTTLES DRAWN AEROBIC ONLY Blood Culture adequate volume   Culture   Final    NO GROWTH 3 DAYS Performed at [facility], [place] [address] [place], [place] [address]    Report Status PENDING  Incomplete  Urine culture     Status: Abnormal   Collection Time: 02/15/20  7:13 AM   Specimen: In/Out Cath Urine  Result Value Ref Range Status   Specimen Description IN/OUT CATH URINE  Final   Special Requests   Final    NONE Performed at [facility], [place] [address], [place], [place] [address]    Culture >=100,000 COLONIES/mL PSEUDOMONAS AERUGINOSA (A)  Final   Report Status 02/17/2020 FINAL  Final   Organism ID, Bacteria PSEUDOMONAS AERUGINOSA (A)  Final      Susceptibility   Pseudomonas aeruginosa -  MIC*    CEFTAZIDIME 2 SENSITIVE Sensitive     CIPROFLOXACIN >=4 RESISTANT Resistant     GENTAMICIN <=1 SENSITIVE Sensitive     IMIPENEM <=0.25 SENSITIVE Sensitive     PIP/TAZO <=4 SENSITIVE Sensitive     * >=100,000 COLONIES/mL PSEUDOMONAS AERUGINOSA  Resp Panel by RT-PCR (Flu A&B, Covid) Nasopharyngeal Swab     Status: None   Collection Time: 02/15/20  7:13 AM   Specimen: Nasopharyngeal Swab; Nasopharyngeal(NP) swabs in vial transport medium  Result Value Ref Range Status   SARS Coronavirus 2 by RT PCR NEGATIVE NEGATIVE Final    Comment: (NOTE) SARS-CoV-2 target nucleic acids are NOT DETECTED.  The SARS-CoV-2 RNA is generally detectable in upper respiratory specimens during the acute phase of infection. The lowest concentration of SARS-CoV-2 viral copies this assay can detect is 138 copies/mL. A negative result does not preclude SARS-Cov-2 infection and should not be used as the sole basis for treatment or other  patient management decisions. A negative result may occur with  improper specimen collection/handling, submission of specimen other than nasopharyngeal swab, presence of viral mutation(s) within the areas targeted by this assay, and inadequate number of viral copies(<138 copies/mL). A negative result must be combined with clinical observations, patient history, and epidemiological information. The expected result is Negative.  Fact Sheet for Patients:  EntrepreneurPulse.com.au  Fact Sheet for Healthcare Providers:  IncredibleEmployment.be  This test is no t yet approved or cleared by the [place] FDA and  has been authorized for detection and/or diagnosis of SARS-CoV-2 by FDA under an Emergency Use Authorization (EUA). This EUA will remain  in effect (meaning this test can be used) for the duration of the COVID-19 declaration under Section 564(b)(1) of the Act, 21 U.S.C.section 360bbb-3(b)(1), unless the authorization is terminated  or revoked sooner.       Influenza A by PCR NEGATIVE NEGATIVE Final   Influenza B by PCR NEGATIVE NEGATIVE Final    Comment: (NOTE) The Xpert Xpress SARS-CoV-2/FLU/RSV plus assay is intended as an aid in the diagnosis of influenza from Nasopharyngeal swab specimens and should not be used as a sole basis for treatment. Nasal washings and aspirates are unacceptable for Xpert Xpress SARS-CoV-2/FLU/RSV testing.  Fact Sheet for Patients: EntrepreneurPulse.com.au  Fact Sheet for Healthcare Providers: IncredibleEmployment.be  This test is not yet approved or cleared by the [place] FDA and has been authorized for detection and/or diagnosis of SARS-CoV-2 by FDA under an Emergency Use Authorization (EUA). This EUA will remain in effect (meaning this test can be used) for the duration of the COVID-19 declaration under Section 564(b)(1) of the Act, 21 U.S.C. section  360bbb-3(b)(1), unless the authorization is terminated or revoked.  Performed at [facility], [place] [address] [place], [place] [address]     Renal Function: Recent Labs    02/15/20 0455 02/17/20 0751 02/18/20 0600  CREATININE 0.61 0.69 0.70   Estimated Creatinine Clearance: 90.9 mL/min (by C-G formula based on SCr of 0.7 mg/dL).  Radiologic Imaging: No results found.  I independently reviewed the above imaging studies.  Impression/Recommendation 1. Septic shock due to urinary source: Resolved 2. UTI: Urine culture 03/05/2020 with Pseudomonas 100 K 3. Chronic urinary retention: Due to areflexic bladder demonstrated on UDS 01/2020 4. Areflexic bladder: Demonstrated on UDS 01/2020 5. Bladder and penile pain: CT A/P 02/16/2020 with perivesicular stranding in setting of cystitis.  Pain likely due to combination of ongoing infection and bladder spasms from indwelling Foley catheter. 6. PMH of chronic  pancytopenia with history of CLL  -Reviewed CT A/P 02/16/2020 with perivesicular cystitis however no other signs of GU obstruction -Continue antibiotics for pseudomonal UTI -We discussed that his pain is likely due to ongoing cystitis and bladder spasms.  Continue anticholinergic medication and belladonna opium suppositories as needed.  Discussed that this pain will improve with ongoing treatment of the UTI. -I discussed options with the patient given his areflexic bladder.  It is clear that he is not able to satisfactorily perform clean intermittent catheterization.  Given his areflexic bladder, it is very unlikely that any bladder outlet obstruction will lead to him not being catheter dependent.  I discussed the option of chronic indwelling Foley catheter which would be exchanged monthly versus suprapubic tube which would also be exchanged monthly.  He elects to proceed with suprapubic tube.  -We will have him follow-up in the office after he recovers from his UTI to schedule elective  suprapubic tube placement. -Greatly appreciate medicine assistance  -Following peripherally.  Please call with questions.  Matt R. Keni Elison MD 02/18/2020, 9:24 PM  [facility]  Pager: [phone]   CC: Ava Swayze, DO

## 2020-02-18 NOTE — Plan of Care (Signed)
  Problem: Education: Goal: Knowledge of General Education information will improve Description Including pain rating scale, medication(s)/side effects and non-pharmacologic comfort measures Outcome: Progressing   

## 2020-02-18 NOTE — Progress Notes (Signed)
Patient confused and was told that he was also confused last night for a short period, will continue to monitor. May need to D/C ambien and/or pain med.

## 2020-02-19 DIAGNOSIS — I5032 Chronic diastolic (congestive) heart failure: Secondary | ICD-10-CM

## 2020-02-19 DIAGNOSIS — N390 Urinary tract infection, site not specified: Secondary | ICD-10-CM | POA: Diagnosis not present

## 2020-02-19 DIAGNOSIS — N4889 Other specified disorders of penis: Secondary | ICD-10-CM | POA: Diagnosis not present

## 2020-02-19 DIAGNOSIS — R3989 Other symptoms and signs involving the genitourinary system: Secondary | ICD-10-CM | POA: Diagnosis not present

## 2020-02-19 DIAGNOSIS — G9340 Encephalopathy, unspecified: Secondary | ICD-10-CM | POA: Diagnosis not present

## 2020-02-19 LAB — CBC WITH DIFFERENTIAL/PLATELET
Abs Immature Granulocytes: 0.11 10*3/uL — ABNORMAL HIGH (ref 0.00–0.07)
Basophils Absolute: 0 10*3/uL (ref 0.0–0.1)
Basophils Relative: 1 %
Eosinophils Absolute: 0 10*3/uL (ref 0.0–0.5)
Eosinophils Relative: 1 %
HCT: 31.3 % — ABNORMAL LOW (ref 39.0–52.0)
Hemoglobin: 10.5 g/dL — ABNORMAL LOW (ref 13.0–17.0)
Immature Granulocytes: 3 %
Lymphocytes Relative: 3 %
Lymphs Abs: 0.1 10*3/uL — ABNORMAL LOW (ref 0.7–4.0)
MCH: 33.5 pg (ref 26.0–34.0)
MCHC: 33.5 g/dL (ref 30.0–36.0)
MCV: 100 fL (ref 80.0–100.0)
Monocytes Absolute: 0.3 10*3/uL (ref 0.1–1.0)
Monocytes Relative: 9 %
Neutro Abs: 3.3 10*3/uL (ref 1.7–7.7)
Neutrophils Relative %: 83 %
Platelets: 91 10*3/uL — ABNORMAL LOW (ref 150–400)
RBC: 3.13 MIL/uL — ABNORMAL LOW (ref 4.22–5.81)
RDW: 17.7 % — ABNORMAL HIGH (ref 11.5–15.5)
WBC: 3.9 10*3/uL — ABNORMAL LOW (ref 4.0–10.5)
nRBC: 0 % (ref 0.0–0.2)

## 2020-02-19 LAB — BASIC METABOLIC PANEL
Anion gap: 9 (ref 5–15)
BUN: 8 mg/dL (ref 8–23)
CO2: 26 mmol/L (ref 22–32)
Calcium: 8.4 mg/dL — ABNORMAL LOW (ref 8.9–10.3)
Chloride: 105 mmol/L (ref 98–111)
Creatinine, Ser: 0.76 mg/dL (ref 0.61–1.24)
GFR, Estimated: >60 mL/min (ref >60)
Glucose, Bld: 102 mg/dL — ABNORMAL HIGH (ref 70–99)
Potassium: 3.2 mmol/L — ABNORMAL LOW (ref 3.5–5.1)
Sodium: 140 mmol/L (ref 135–145)

## 2020-02-19 MED ORDER — BELLADONNA ALKALOIDS-OPIUM 16.2-60 MG RE SUPP
0.5000 | Freq: Four times a day (QID) | RECTAL | Status: DC | PRN
  Administered 2020-02-19: 0.5 via RECTAL
  Filled 2020-02-19 (×2): qty 1

## 2020-02-19 MED ORDER — HALOPERIDOL LACTATE 5 MG/ML IJ SOLN
2.0000 mg | Freq: Four times a day (QID) | INTRAMUSCULAR | Status: DC | PRN
  Filled 2020-02-19: qty 1

## 2020-02-19 MED ORDER — MIRTAZAPINE 15 MG PO TABS
15.0000 mg | ORAL_TABLET | Freq: Every day | ORAL | Status: DC
  Administered 2020-02-19: 15 mg via ORAL
  Filled 2020-02-19: qty 1

## 2020-02-19 MED ORDER — BELLADONNA ALKALOIDS-OPIUM 16.2-30 MG RE SUPP: 1.0000 | Freq: Four times a day (QID) | RECTAL | Status: DC | PRN

## 2020-02-19 MED ORDER — SODIUM CHLORIDE 0.9 % IV SOLN
INTRAVENOUS | Status: DC | PRN
  Administered 2020-02-19: 250 mL via INTRAVENOUS

## 2020-02-19 NOTE — Progress Notes (Signed)
PROGRESS NOTE  Ryan Copeland FIE:[id number] DOB: 1950-02-20 DOA: 02/15/2020 PCP: [name]., PA-C  Brief History   Ryan Copeland is a 70 y.o. year old male with medical history significant for Small B-cell lymphoma of multiple sites, on IVIG monthly for hypogammaglobulinemia, prior hospitalization on 12/20/2019 for septic shock presumed secondary to UTI from Pseudomonas requiring cefepime who presented from home with hypotension (73/49 per EMS) and increased lethargy and was admitted with working diagnosis of septic shock secondary to presumed urinary source. Was later found to have pseudomonal UTI in setting of recent urinary catheterization (patient has chronic indwelling Foley for urinary retention that was removed a few days prior to admission, and patient was doing straight caths at home).  Discussed with wife prior to admission he had to go back to doing self catheterizations and noticed pus and blood at the catheter site followed by low blood pressure and poor mentation   Hospital course initially required management in ICU on Levophed given septic shock. Patient was continued on empiric vancomycin and Zosyn was able to transition off pressor support to progressive unit under [place] service on 02/16/2020.   Urine culture returned positive for Pseudomonas aeruginosa on 02/16/2020. Patient also reports he recently had his Foley catheter replaced for urinary retention during a prior hospitalization removed as an outpatient by his urologist a few days prior to this admission.  Due to patient's ongoing issues with penile pain related to his catheter, Dr. Abner Greenspan, his urologist plans to place suprapubic catheter as outpatient per patient. ihave discussed the patient with Dr. Alinda Money to see if this can be done prior to discharge given the patient's pain and agitation due to pain. Dr. Alinda Money says that he will see the patient and discuss with Dr. Dannielle Karvonen.   The patient has had severe agitation over the last 2  nights and has required restraints. Patient denies and states that his nurse doesn't listen to him.  Consultants  . Urology  Procedures  . None  Antibiotics   Anti-infectives (From admission, onward)   Start     Dose/Rate Route Frequency Ordered Stop   02/15/20 2200  vancomycin (VANCOCIN) IVPB 1000 mg/200 mL premix  Status:  Discontinued        1,000 mg 200 mL/hr over 60 Minutes Intravenous Every 12 hours 02/15/20 0641 02/16/20 1039   02/15/20 1400  piperacillin-tazobactam (ZOSYN) IVPB 3.375 g        3.375 g 12.5 mL/hr over 240 Minutes Intravenous Every 8 hours 02/15/20 0945     02/15/20 1000  acyclovir (ZOVIRAX) 200 MG capsule 400 mg        400 mg Oral 2 times daily 02/15/20 0935     02/15/20 0500  vancomycin (VANCOCIN) IVPB 1000 mg/200 mL premix        1,000 mg 200 mL/hr over 60 Minutes Intravenous  Once 02/15/20 0456 02/15/20 0655   02/15/20 0500  piperacillin-tazobactam (ZOSYN) IVPB 3.375 g        3.375 g 12.5 mL/hr over 240 Minutes Intravenous  Once 02/15/20 0456 02/15/20 1055      Subjective  The patient is resting quietly in bed. He continues to complain of pain in his penis related to the catheter. He is also having confusion and agitation overnight, although the patient disputes this.  Objective   Vitals:  Vitals:   02/19/20 0750 02/19/20 1133  BP: 132/73 109/61  Pulse: 65 (!) 49  Resp: 18 18  Temp: 98.8 F (37.1 C) 97.8 F (  36.6 C)  SpO2: 97% 98%    Exam:  Constitutional:  . The patient is awake, alert, and oriented x 3. Mild distress from penile pain. Respiratory:  . No increased work of breathing. . No wheezes, rales, or rhonchi . No tactile fremitus Cardiovascular:  . Regular rate and rhythm . No murmurs, ectopy, or gallups. . No lateral PMI. No thrills. Abdomen:  . Abdomen is soft, non-tender, non-distended . No hernias, masses, or organomegaly . Normoactive bowel sounds.  Musculoskeletal:  . No cyanosis, clubbing, or edema Skin:  . No  rashes, lesions, ulcers . palpation of skin: no induration or nodules Neurologic:  . CN 2-12 intact . Sensation all 4 extremities intact Psychiatric:  . Mental status o Mood, affect appropriate o Orientation to person, place, time  . judgment and insight appear intact  I have personally reviewed the following:   Today's Data  . Vitals, CBC, BMP  Micro Data  . Urine culture has grown out pseudomonas aeruginosa . Blood Culture x 2: No growth  Imaging  . CT abdomen and pelvis  Scheduled Meds: . acyclovir  400 mg Oral BID  . Chlorhexidine Gluconate Cloth  6 each Topical Daily  . famotidine  20 mg Oral Daily  . heparin  5,000 Units Subcutaneous Q8H  . midodrine  5 mg Oral TID WC  . mirtazapine  15 mg Oral QHS  . sodium chloride flush  10-40 mL Intracatheter Q12H  . tamsulosin  0.4 mg Oral QHS   Continuous Infusions: . sodium chloride Stopped (02/19/20 0520)  . lactated ringers    . piperacillin-tazobactam (ZOSYN)  IV 3.375 g (02/19/20 1323)    Active Problems:   Diabetes mellitus without complication (HCC)   Sepsis ([facility])   Chronic diastolic CHF (congestive heart failure) (HCC)   Septic shock (HCC)   Acute lower UTI   Bladder pain   Chronic hypotension   LOS: 4 days   A & P   Septic shock:   Resolved. Secondary to pseudomonal UTI  in the setting of recent urinary catheterization, prior to admission. Blood pressure remains at goal off pressors, afebrile, normal WBC, no growth on blood cultures x1 day. Urine culture positive for pseudomonas aeruginosa. Hx previous UTI from same organism that was resistant to all but zosyn. Sensitivities pending. Currently receiving zosyn. Vancomycin discontinued.   Chronic urinary retention. Followed by urology as outpatient ( did outpatient cystoscopy on 12/28 and had foley removed with instruction to self cath as needed)  New Foley catheter was placed during this admission. Patient states that he has had severe penile pain since foley  placement. Bladder scan shows no retention.    Bladder and penile pain. Reports tenderness at left lower quadrant concerning for bladder fullness there. No external issues noted on my exam. Bladder scan unremarkable with no retention. Could be related to ongoing infection but pain very severe on exam though no signs of acute distress. Other DDX includes bladder stones, ascending UTI, hydronephrosis, interstitial cystitis. cT abdomen and pelvis demonstrates no cause for pain. B&O suppository ordered. The patient's urologist, ,Dr. Dannielle Karvonen plans to place suprapubic catheter as outpatient after discharge. I have discussed the patient with Dr. Alinda Money to see if this can be done prior to discharge given the patient's pain and agitation due to pain. Dr. Alinda Money says that he will see the patient and discuss with Dr. Dannielle Karvonen.     Chronic hypotension, stable. SBP's in the 90s. Remains asymptomatic. Continue midodrine.   Chronic pancytopenia, multifactorial  etiology including splenomegaly with history of CLL. No bleeding episodes. Monitor CBC.   CLL, stable now-follows with oncology as outpatient  I have seen and examined this patient myself. I have spent 32 minutes in his evaluation and care.   DVT Prophylaxis  : Heparin Family Communication  :  Updated his wife by phone on 02/16/2020 Code Status :  FULL CODE Disposition Plan  :  Patient is from home. Anticipated d/c date: 2 to 3 days. Barriers to d/c or necessity for inpatient status: IV zosyn for UTI with pseudomonas, renal ultrasound   Ryan Atkins, DO [facility] Direct contact: see [email or web address]  7PM-7AM contact night coverage as above 02/19/2020, 3:14 PM  LOS: 2 days

## 2020-02-19 NOTE — Progress Notes (Signed)
Soft wrist retraints applied and removed ([id number]). Patient is still confused, but is no longer combative. Will continue to assess. Patient ended up dislodging needle access to chest port, IV team called and port reaccessed.

## 2020-02-19 NOTE — Progress Notes (Signed)
Cross-coverage note:   Patient confused, combative, pulling at lines and tubes. Non-pharmacologic and pharmacologic interventions have been unsuccessful so far and there are no sitters available. Plan to use soft restraints for patient safety, continue frequent reassessments, and discontinue as soon as possible.

## 2020-02-19 NOTE — Progress Notes (Signed)
Patient became combative and swinging at staff, patient was unsteady on feet and would not use walker correctly when trying to help patient. He pulled and accessed port, foley, and tele monitor. Threatening to punch staff. Patient is hard to redirect. MD paged for orders.

## 2020-02-20 DIAGNOSIS — N390 Urinary tract infection, site not specified: Secondary | ICD-10-CM | POA: Diagnosis not present

## 2020-02-20 DIAGNOSIS — G9340 Encephalopathy, unspecified: Secondary | ICD-10-CM | POA: Diagnosis not present

## 2020-02-20 DIAGNOSIS — I9589 Other hypotension: Secondary | ICD-10-CM | POA: Diagnosis not present

## 2020-02-20 DIAGNOSIS — A419 Sepsis, unspecified organism: Secondary | ICD-10-CM | POA: Diagnosis not present

## 2020-02-20 LAB — CULTURE, BLOOD (ROUTINE X 2)
Culture: NO GROWTH
Culture: NO GROWTH
Special Requests: ADEQUATE
Special Requests: ADEQUATE

## 2020-02-20 MED ORDER — MIRTAZAPINE 15 MG PO TABS: 15.0000 mg | ORAL_TABLET | Freq: Every day | ORAL | 0 refills | Status: DC

## 2020-02-20 MED ORDER — HEPARIN SOD (PORK) LOCK FLUSH 100 UNIT/ML IV SOLN
500.0000 [IU] | INTRAVENOUS | Status: AC | PRN
  Administered 2020-02-20: 500 [IU]
  Filled 2020-02-20: qty 5

## 2020-02-20 MED ORDER — AMOXICILLIN-POT CLAVULANATE 875-125 MG PO TABS: 1.0000 | ORAL_TABLET | Freq: Two times a day (BID) | ORAL | 0 refills | Status: AC

## 2020-02-20 MED ORDER — BELLADONNA ALKALOIDS-OPIUM 16.2-60 MG RE SUPP: 0.5000 | Freq: Four times a day (QID) | RECTAL | 0 refills | Status: DC | PRN

## 2020-02-20 MED ORDER — ACETAMINOPHEN 325 MG PO TABS: 650.0000 mg | ORAL_TABLET | Freq: Four times a day (QID) | ORAL | 0 refills | Status: AC | PRN

## 2020-02-20 MED ORDER — AMOXICILLIN-POT CLAVULANATE 875-125 MG PO TABS: 1.0000 | ORAL_TABLET | Freq: Two times a day (BID) | ORAL | 0 refills | Status: DC

## 2020-02-20 NOTE — Plan of Care (Signed)
  Problem: Health Behavior/Discharge Planning: Goal: Ability to manage health-related needs will improve Outcome: Progressing   Problem: Clinical Measurements: Goal: Ability to maintain clinical measurements within normal limits will improve Outcome: Progressing   

## 2020-02-20 NOTE — Care Management Important Message (Signed)
Important Message  Patient Details  Name: Ryan Copeland MRN: [id number] Date of Birth: 01/02/1951   Medicare Important Message Given:  Yes     [name] 02/20/2020, 11:34 AM

## 2020-02-20 NOTE — Discharge Summary (Addendum)
Physician Discharge Summary  Ryan Copeland MHD:[id number] DOB: May 17, 1950 DOA: 02/15/2020  PCP: [name]., PA-C  Admit date: 02/15/2020 Discharge date: 02/20/2020  Recommendations for Outpatient Follow-up:  Discharge to home.  Follow up with Dr. Dannielle Karvonen at earliest appointment. Follow up with PCP in 7-10 days. Have chemistry and CBC drawn on PCP visit to be reported to PCP. Discharge with foley catheter in place.    Follow-up Information     [name]., PA-C. Go on 02/24/2020.   Specialty: Family Medicine Why: @1 :50pm Contact information: [address] 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 Per chart review he has recently been treated for pneumonia and uti's.   Pt was mildly altered on presentation and not responsive to fluids initially. He was started on low dose pressors, empiric abx and CCM has been asked to admit.    Upon my exam pt is angry yelling about staff and the fact he has been in the ER for over 4 hours. He is demanding a third person come to do sttriaght cath (two people before he states are not allowed back in). He is refusing to answer questions stating he "doesn't feel like it".    Multiple occasions had to ask him to calm and speak to staff with respect. He cont to refuse care despite multiple  reassurances we were all trying to help. He appears oriented just angry  Hospital Course: [facility] were consulted to admit the patient for further evaluation and care.   The patient was admitted to a telemetry bed. He was given IV fluids and IV antibiotics. The patient was given B&O suppositories for his severe penile pain as well as oxybutynin. He continued to have pain which was exacerbated due to his in advertently having pulled out foley catheter without deflating bulb. He has had sever hematuria since that. Urology was consulted, although the patient had plans to place a suprapubic catheter as a way to address the patient's foley catheter pain.  During his stay the patient has been bradycardic. His lopressor was stopped due to asymptomatic bradycardia.  Nursing will walk the patient and check ambulatory SaO2. The patient will be discharged to home in fair condition.  Today's assessment: S: The patient was sitting up at bedside. No new complaints. O: Vitals:  Vitals:   02/20/20 0810 02/20/20 1207  BP: (!) 109/49 (!) 100/57  Pulse: (!) 50 (!) 50  Resp: 18 19  Temp: 99 F (37.2 C) 97.8 F (36.6 C)  SpO2: 97% 100%  Exam:   Constitutional:  The patient is awake, alert, and oriented x 3. Mild distress from penile pain. Respiratory:  No increased work of breathing. No wheezes, rales, or rhonchi No tactile fremitus Cardiovascular:  Regular rate and rhythm No murmurs, ectopy, or gallups. No lateral PMI. No thrills. Abdomen:  Abdomen is soft, non-tender, non-distended No hernias, masses, or organomegaly Normoactive bowel sounds.  Musculoskeletal:  No cyanosis, clubbing, or edema Skin:  No rashes, lesions, ulcers palpation of skin: no induration or nodules Neurologic:  CN 2-12 intact Sensation all 4 extremities intact Psychiatric:  Mental status Mood, affect appropriate Orientation to person, place, time  judgment and insight appear intact  Discharge  Instructions  Discharge Instructions     Activity as tolerated - No restrictions   Complete by: As directed    Call MD for:  extreme fatigue   Complete by: As directed    Call MD for:  persistant dizziness or light-headedness   Complete by: As directed    Diet - low sodium heart healthy   Complete by: As directed    Discharge instructions   Complete by: As directed    Discharge to home.  Follow up with Dr. Dannielle Karvonen at earliest appointment. Follow up with PCP in 7-10 days. Have chemistry and CBC drawn on PCP visit to be reported to PCP. Discharge with foley catheter in place.   Increase activity slowly   Complete by: As directed       Allergies as of 02/20/2020       Reactions   Immune Globulin Hives, Rash   Pt reports hives/rash on body with IV Privigen. He was changed to IV Gamunex-C and had many infusions without adverse side effects.   Codeine Hives   Pt states he can take a few, more reaction with extended doses.        Medication List     STOP taking these medications    HYDROcodone-acetaminophen 10-325 MG tablet Commonly known as: NORCO   loperamide 2 MG capsule Commonly known as: IMODIUM   meclizine 12.5 MG tablet Commonly known as: ANTIVERT   montelukast 10 MG tablet Commonly known as: SINGULAIR   zolpidem 10 MG tablet Commonly known as: AMBIEN       TAKE these medications    acetaminophen 325 MG tablet Commonly known as: TYLENOL Take 2 tablets (650 mg total) by mouth every 6 (six) hours as needed for moderate pain or fever.   acyclovir 400 MG tablet Commonly known as: ZOVIRAX Take 1 tablet (400 mg total) by mouth 2 (two) times daily.   albuterol 108 (90 Base) MCG/ACT inhaler Commonly known as: Proventil HFA TAKE 2 PUFFS BY MOUTH EVERY 6 HOURS AS NEEDED FOR WHEEZE OR SHORTNESS OF BREATH What changed:  how much to take how to take this when to take this reasons to take this additional instructions   allopurinol 300 MG tablet Commonly  known as: ZYLOPRIM TAKE 1 TABLET BY MOUTH EVERY DAY   ALPRAZolam 0.5 MG tablet Commonly known as: XANAX Take 0.5 mg by mouth daily.   amoxicillin-clavulanate 875-125 MG tablet Commonly known as: Augmentin Take 1 tablet by mouth 2 (two) times daily for 4 days.   b complex vitamins tablet Take 1 tablet by mouth daily.   benzonatate 100 MG capsule Commonly known as: TESSALON TAKE TWO CAPSULES BY MOUTH THREE TIMES A DAY AS NEEDED COUGH What changed: See the new instructions.   Breo Ellipta 200-25 MCG/INH Aepb Generic drug: fluticasone furoate-vilanterol Inhale 1 puff into the lungs daily.   Calquence 100 MG capsule Generic drug: acalabrutinib TAKE 100 MG BY MOUTH 2 (TWO) TIMES DAILY.   Cholecalciferol 25 MCG (1000 UT) tablet Take 1,000 Units by mouth daily.   famotidine 20 MG tablet Commonly known as: PEPCID TAKE 1 TABLET BY MOUTH  TWICE A DAY What changed:  when to take this reasons to take this   metFORMIN 500 MG 24 hr tablet Commonly known as: GLUCOPHAGE-XR Take 500 mg by mouth daily with breakfast.   midodrine 5 MG tablet Commonly known as: PROAMATINE Take 1 tablet (5 mg total) by mouth 3 (three) times daily with meals.   mirtazapine 15 MG tablet Commonly known as: REMERON Take 1 tablet (15 mg total) by mouth at bedtime.   mometasone 50 MCG/ACT nasal spray Commonly known as: NASONEX Place 2 sprays into the nose daily.   nitroGLYCERIN 0.4 MG SL tablet Commonly known as: NITROSTAT PLACE 1 TABLET UNDER THE TONGUE EVERY 5 MINUTES X 3 DOSES AS NEEDED FOR CHEST PAIN *MAX 3 DOSES* What changed:  how much to take how to take this when to take this reasons to take this additional instructions   opium-belladonna 16.2-60 MG suppository Commonly known as: B&O SUPPRETTES Place 0.5 suppositories rectally every 6 (six) hours as needed (severe pain).   pregabalin 200 MG capsule Commonly known as: LYRICA TAKE 1 CAPSULE (200 MG TOTAL) BY MOUTH 3 (THREE) TIMES  DAILY.   rosuvastatin 5 MG tablet Commonly known as: CRESTOR TAKE 1 TABLET BY MOUTH EVERY DAY   tamsulosin 0.4 MG Caps capsule Commonly known as: FLOMAX Take 1 capsule (0.4 mg total) by mouth daily. What changed: when to take this       Allergies  Allergen Reactions   Immune Globulin Hives and Rash    Pt reports hives/rash on body with IV Privigen. He was changed to IV Gamunex-C and had many infusions without adverse side effects.    Codeine Hives    Pt states he can take a few, more reaction with extended doses.    The results of significant diagnostics from this hospitalization (including imaging, microbiology, ancillary and laboratory) are listed below for reference.    Significant Diagnostic Studies: DG Chest Port 1 View  Result Date: 02/15/2020 CLINICAL DATA:  Sepsis EXAM: PORTABLE CHEST 1 VIEW COMPARISON:  12/18/2019 FINDINGS: Improved aeration of the lungs. Unchanged position of right chest wall Port-A-Cath. No pleural effusion or pneumothorax. Normal cardiomediastinal contours. IMPRESSION: Improved aeration of the lungs without new area of consolidation. Electronically Signed   By: Ulyses Jarred M.D.   On: 02/15/2020 05:32   CT RENAL STONE STUDY  Result Date: 02/16/2020 CLINICAL DATA:  70 year old male with history of flank pain. Suspected pseudomonal urinary tract infection. EXAM: CT ABDOMEN AND PELVIS WITHOUT CONTRAST TECHNIQUE: Multidetector CT imaging of the abdomen and pelvis was performed following the standard protocol without IV contrast. COMPARISON:  CT the abdomen and pelvis 04/06/2018. FINDINGS: Lower chest: Airspace consolidation in the lower lobes of the lungs bilaterally (right greater than left). Trace bilateral pleural effusions (right greater than left). Atherosclerotic calcifications in the descending thoracic aorta as well as the right coronary artery. Hepatobiliary: No definite suspicious cystic or solid hepatic lesions are confidently identified on today's  noncontrast CT examination. Status post cholecystectomy. Pancreas: No definite pancreatic mass or peripancreatic fluid collections or inflammatory changes are noted on today's noncontrast CT examination. Spleen: Spleen is enlarged measuring 12.3 x 8.9 x 20.3 cm (estimated splenic volume of 1,111 mL) . Adrenals/Urinary Tract: There are no abnormal calcifications within the collecting system of either kidney, along the course of either ureter, or within the lumen of the urinary bladder. No hydroureteronephrosis or perinephric stranding to suggest urinary tract obstruction at this time. The unenhanced appearance of the kidneys is unremarkable bilaterally. Urinary  bladder is completely decompressed around an indwelling Foley balloon catheter. Haziness in the perivesical fat. Unenhanced appearance of the adrenal glands is normal. Stomach/Bowel: Unenhanced appearance of the stomach is normal. No pathologic dilatation of small bowel or colon. The appendix is not confidently identified and may be surgically absent. Regardless, there are no inflammatory changes noted adjacent to the cecum to suggest the presence of an acute appendicitis at this time. Vascular/Lymphatic: Aortic atherosclerosis. No lymphadenopathy noted in the abdomen or pelvis. Reproductive: Prostate gland and seminal vesicles are unremarkable in appearance. Other: Edema in the presacral space. No significant volume of ascites. No pneumoperitoneum. Musculoskeletal: There are no aggressive appearing lytic or blastic lesions noted in the visualized portions of the skeleton. IMPRESSION: 1. Extensive soft tissue stranding in the perivesical fat, likely from underlying cystitis. 2. No urinary tract calculi. No findings of urinary tract obstruction. 3. Lower lobe airspace consolidation bilaterally (right greater than left) compatible with pneumonia, with trace bilateral parapneumonic pleural effusions. 4. Splenomegaly. 5. Aortic atherosclerosis, in addition to  least right coronary artery disease. Please note that although the presence of coronary artery calcium documents the presence of coronary artery disease, the severity of this disease and any potential stenosis cannot be assessed on this non-gated CT examination. Assessment for potential risk factor modification, dietary therapy or pharmacologic therapy may be warranted, if clinically indicated. 6. Additional incidental findings, as above. Electronically Signed   By: Vinnie Langton M.D.   On: 02/16/2020 20:07    Microbiology: Recent Results (from the past 240 hour(s))  Blood Culture (routine x 2)     Status: None   Collection Time: 02/15/20  5:43 AM   Specimen: BLOOD LEFT WRIST  Result Value Ref Range Status   Specimen Description BLOOD LEFT WRIST  Final   Special Requests   Final    BOTTLES DRAWN AEROBIC AND ANAEROBIC Blood Culture adequate volume   Culture   Final    NO GROWTH 5 DAYS Performed at [facility], [address], [place], [place] [address]    Report Status 02/20/2020 FINAL  Final  Blood Culture (routine x 2)     Status: None   Collection Time: 02/15/20  5:44 AM   Specimen: BLOOD RIGHT HAND  Result Value Ref Range Status   Specimen Description BLOOD RIGHT HAND  Final   Special Requests   Final    BOTTLES DRAWN AEROBIC ONLY Blood Culture adequate volume   Culture   Final    NO GROWTH 5 DAYS Performed at [facility], [place] [address] [place], [place] [address]    Report Status 02/20/2020 FINAL  Final  Urine culture     Status: Abnormal   Collection Time: 02/15/20  7:13 AM   Specimen: In/Out Cath Urine  Result Value Ref Range Status   Specimen Description IN/OUT CATH URINE  Final   Special Requests   Final    NONE Performed at [facility], [place] [address] [place], [place] [address]    Culture >=100,000 COLONIES/mL PSEUDOMONAS AERUGINOSA (A)  Final   Report Status 02/17/2020 FINAL  Final   Organism ID, Bacteria PSEUDOMONAS AERUGINOSA (A)  Final       Susceptibility   Pseudomonas aeruginosa - MIC*    CEFTAZIDIME 2 SENSITIVE Sensitive     CIPROFLOXACIN >=4 RESISTANT Resistant     GENTAMICIN <=1 SENSITIVE Sensitive     IMIPENEM <=0.25 SENSITIVE Sensitive     PIP/TAZO <=4 SENSITIVE Sensitive     * >=  100,000 COLONIES/mL PSEUDOMONAS AERUGINOSA  Resp Panel by RT-PCR (Flu A&B, Covid) Nasopharyngeal Swab     Status: None   Collection Time: 02/15/20  7:13 AM   Specimen: Nasopharyngeal Swab; Nasopharyngeal(NP) swabs in vial transport medium  Result Value Ref Range Status   SARS Coronavirus 2 by RT PCR NEGATIVE NEGATIVE Final    Comment: (NOTE) SARS-CoV-2 target nucleic acids are NOT DETECTED.  The SARS-CoV-2 RNA is generally detectable in upper respiratory specimens during the acute phase of infection. The lowest concentration of SARS-CoV-2 viral copies this assay can detect is 138 copies/mL. A negative result does not preclude SARS-Cov-2 infection and should not be used as the sole basis for treatment or other patient management decisions. A negative result may occur with  improper specimen collection/handling, submission of specimen other than nasopharyngeal swab, presence of viral mutation(s) within the areas targeted by this assay, and inadequate number of viral copies(<138 copies/mL). A negative result must be combined with clinical observations, patient history, and epidemiological information. The expected result is Negative.  Fact Sheet for Patients:  EntrepreneurPulse.com.au  Fact Sheet for Healthcare Providers:  IncredibleEmployment.be  This test is no t yet approved or cleared by the [place] FDA and  has been authorized for detection and/or diagnosis of SARS-CoV-2 by FDA under an Emergency Use Authorization (EUA). This EUA will remain  in effect (meaning this test can be used) for the duration of the COVID-19 declaration under Section 564(b)(1) of the Act, 21 U.S.C.section  360bbb-3(b)(1), unless the authorization is terminated  or revoked sooner.       Influenza A by PCR NEGATIVE NEGATIVE Final   Influenza B by PCR NEGATIVE NEGATIVE Final    Comment: (NOTE) The Xpert Xpress SARS-CoV-2/FLU/RSV plus assay is intended as an aid in the diagnosis of influenza from Nasopharyngeal swab specimens and should not be used as a sole basis for treatment. Nasal washings and aspirates are unacceptable for Xpert Xpress SARS-CoV-2/FLU/RSV testing.  Fact Sheet for Patients: EntrepreneurPulse.com.au  Fact Sheet for Healthcare Providers: IncredibleEmployment.be  This test is not yet approved or cleared by the [place] FDA and has been authorized for detection and/or diagnosis of SARS-CoV-2 by FDA under an Emergency Use Authorization (EUA). This EUA will remain in effect (meaning this test can be used) for the duration of the COVID-19 declaration under Section 564(b)(1) of the Act, 21 U.S.C. section 360bbb-3(b)(1), unless the authorization is terminated or revoked.  Performed at [facility], [place] [address], [place], [place] [address]      Labs: Basic Metabolic Panel: Recent Labs  Lab 02/15/20 0455 02/17/20 0751 02/18/20 0600 02/19/20 0429  NA 140 144 143 140  K 3.6 3.2* 3.5 3.2*  CL 111 109 109 105  CO2 21* 25 26 26   GLUCOSE 92 104* 84 102*  BUN 9 7* 8 8  CREATININE 0.61 0.69 0.70 0.76  CALCIUM 7.6* 8.4* 8.4* 8.4*   Liver Function Tests: Recent Labs  Lab 02/15/20 0455  AST 11*  ALT 10  ALKPHOS 67  BILITOT 0.5  PROT 4.8*  ALBUMIN 2.4*   No results for input(s): LIPASE, AMYLASE in the last 168 hours. No results for input(s): AMMONIA in the last 168 hours. CBC: Recent Labs  Lab 02/15/20 0455 02/17/20 0751 02/18/20 0927 02/19/20 0429  WBC 6.9 4.3  --  3.9*  NEUTROABS 5.3 3.5  --  3.3  HGB 9.9* 10.2* 10.5* 10.5*  HCT 30.4* 32.3* 31.8* 31.3*  MCV 104.5* 103.2*  --  100.0  PLT 73* 71*  --   91*   Cardiac Enzymes: No results for input(s): CKTOTAL, CKMB, CKMBINDEX, TROPONINI in the last 168 hours. BNP: BNP (last 3 results) Recent Labs    12/15/19 0647 02/15/20 0503  BNP 203.2* 146.4*    ProBNP (last 3 results) No results for input(s): PROBNP in the last 8760 hours.  CBG: Recent Labs  Lab 02/17/20 1128 02/17/20 1651 02/17/20 1928 02/18/20 0019 02/18/20 0356  GLUCAP 114* 72 134* 98 81    Active Problems:   Diabetes mellitus without complication (HCC)   Sepsis (HCC)   Chronic diastolic CHF (congestive heart failure) (HCC)   Septic shock (HCC)   Acute lower UTI   Bladder pain   Chronic hypotension   Time coordinating discharge: 36 minutes.  Signed:        Thoms Barthelemy, DO [facility]  02/20/2020, 2:30 PM

## 2020-02-20 NOTE — TOC Initial Note (Signed)
Transition of Care [facility]) - Initial/Assessment Note    Patient Details  Name: Ryan Copeland MRN: [id number] Date of Birth: 05-27-1950  Transition of [facility]) CM/SW Contact:    Zenon Mayo, RN Phone Number: 02/20/2020, 2:41 PM  Clinical Narrative:                 NCM spoke with patient, he is for dc today, he states he has no problems with getting his medications, he has transportation home.    Expected Discharge Plan: Home/Self Care Barriers to Discharge: No Barriers Identified   Patient Goals and CMS Choice Patient states their goals for this hospitalization and ongoing recovery are:: get better   Choice offered to / list presented to : NA  Expected Discharge Plan and Services Expected Discharge Plan: Home/Self Care   Discharge Planning Services: CM Consult   Living arrangements for the past 2 months: Single Family Home Expected Discharge Date: 02/20/20                 DME Agency: NA       HH Arranged: NA          Prior Living Arrangements/Services Living arrangements for the past 2 months: Single Family Home Lives with:: Spouse Patient language and need for interpreter reviewed:: Yes Do you feel safe going back to the place where you live?: Yes      Need for Family Participation in Patient Care: Yes (Comment) Care giver support system in place?: Yes (comment)   Criminal Activity/Legal Involvement Pertinent to Current Situation/Hospitalization: No - Comment as needed  Activities of Daily Living Home Assistive Devices/Equipment: Walker (specify type),Eyeglasses ADL Screening (condition at time of admission) Patient's cognitive ability adequate to safely complete daily activities?: Yes Is the patient deaf or have difficulty hearing?: No Does the patient have difficulty seeing, even when wearing glasses/contacts?: No Does the patient have difficulty concentrating, remembering, or making decisions?: No Patient able to express need for assistance with  ADLs?: Yes Does the patient have difficulty dressing or bathing?: No Independently performs ADLs?: Yes (appropriate for developmental age) Does the patient have difficulty walking or climbing stairs?: Yes Weakness of Legs: Both Weakness of Arms/Hands: None  Permission Sought/Granted                  Emotional Assessment   Attitude/Demeanor/Rapport: Engaged Affect (typically observed): Appropriate Orientation: : Oriented to Self,Oriented to Place,Oriented to  Time,Oriented to Situation Alcohol / Substance Use: Not Applicable Psych Involvement: No (comment)  Admission diagnosis:  Acute encephalopathy [G93.40] Septic shock ([place]) [A41.9, R65.21] Sepsis ([place]) [A41.9] Patient Active Problem List   Diagnosis Date Noted  . Acute lower UTI 02/16/2020  . Bladder pain 02/16/2020  . Chronic hypotension 02/16/2020  . Vitamin B12 deficiency 01/01/2020  . Hypotension due to drugs 01/01/2020  . Protein-calorie malnutrition, moderate ([place]) 01/01/2020  . Septic shock ([place]) 12/15/2019  . Community acquired bacterial pneumonia 11/28/2019  . Cerebral vascular disease 11/25/2019  . 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 Other fatigue 10/25/2019  . Deficiency anemia 10/25/2019  . Dizziness 10/22/2019  . Gait abnormality 10/22/2019  . Hx of nonmelanoma skin cancer 03/15/2019  . Essential hypertension 04/24/2018  . Influenza A 04/17/2018  . Sepsis ([place]) 04/17/2018  . Chronic diastolic CHF (congestive heart failure) ([place]) 04/17/2018  . Skin rash 08/07/2017  . Port-A-Cath in place 08/04/2017  . 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 Retained dental roots 04/26/2017  . Dental caries 04/26/2017  .  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 Loose, teeth 04/26/2017  . Peripheral polyneuropathy 04/14/2017  . COPD mixed type ([place]) 02/23/2017  . Chronic sinusitis 01/27/2017  . Splenomegaly, congestive, chronic 12/29/2016  . Diarrhea 12/01/2016  . Poor  dentition 12/01/2016  . Benign mole 12/01/2016  . Pancytopenia, acquired ([place]) 11/03/2016  . Odynophagia 01/15/2016  . Polycythemia, secondary 06/26/2015  . Quality of life palliative care encounter 06/26/2015  . Atherosclerosis of extremity with intermittent claudication ([place]) 01/23/2015  . Lateral epicondylitis of left elbow   . ECRB (extensor carpi radialis brevis) tenosynovitis   . Anxiety 01/30/2014  . Preoperative clearance 01/30/2014  . Thrombocytopenia ([place]) 01/30/2014  . Diabetes mellitus without complication ([place]) 60/45/4098  . Hypogammaglobulinemia, acquired ([place]) 12/26/2012  . Hereditary and idiopathic peripheral neuropathy 08/20/2012  . 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 CAD (coronary artery disease) 07/29/2011  . 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 ([place]) 03/18/2011  . MI, acute, non ST segment elevation ([place])   . Hyperlipidemia   . PVD (peripheral vascular disease) ([place])   . GERD 09/27/2008  . SHOULDER STRAIN, RIGHT 09/27/2008   PCP:  [name]., PA-C Pharmacy:   [id number] - [place], [place] [address] Phone: [phone] Fax: [place], [place] [address] Phone: [phone] Fax: [place], [place] [address] [address] Phone: [phone] Fax: [phone]     Social Determinants of Health (SDOH) Interventions    Readmission Risk Interventions Readmission Risk Prevention Plan 02/20/2020 12/03/2019  Transportation Screening Complete Complete  Medication Review Press photographer) Complete Complete  PCP or Specialist appointment within 3-5 days of discharge - Complete  HRI or [place] Complete Complete  SW Recovery Care/Counseling Consult Complete  Complete  Palliative Care Screening Not Applicable Not [place] Not Applicable Not Applicable  Some recent data might be hidden

## 2020-02-20 NOTE — Progress Notes (Addendum)
  Mobility Specialist Criteria Algorithm Info.  SATURATION QUALIFICATIONS: (This note is used to comply with regulatory documentation for home oxygen)  Patient Saturations on Room Air at Rest = 97%  Patient Saturations on Room Air while Ambulating = 95%  Patient Saturations on 0 Liters of oxygen while Ambulating = n/a%  Please briefly explain why patient needs home oxygen:  Mobility Team:  [facility] elevated:Self regulated Activity: Ambulated in hall (to chair after ambulation); Ambulate in room Range of motion: Active; All extremities Level of assistance: Independent Assistive device: Other (Comment) (IV Pole) Minutes sitting in chair:  Minutes stood: 5 minutes Minutes ambulated: 5 minutes Distance ambulated (ft): 360 ft Mobility response: Tolerated well Bed Position: Chair  Upon entering room, pt was up ambulating without assistance pushing IV pole around. Eagerly agreed to participate in mobility with anticipation to be discharged. Ambulated in hallway 360 feet with steady gait. Tolerated ambulation well without incident and denied any dizziness or pain. Patient is now sitting in recliner chair with all needs met.  02/20/2020 2:21 PM

## 2020-02-20 NOTE — TOC Transition Note (Signed)
Transition of Care [facility]) - CM/SW Discharge Note   Patient Details  Name: EGIDIO LOFGREN MRN: [id number] Date of Birth: May 22, 1950  Transition of Care [facility]) CM/SW Contact:  Zenon Mayo, RN Phone Number: 02/20/2020, 2:43 PM   Clinical Narrative:    NCM spoke with patient, he is for dc today, he states he has no problems with getting his medications, he has transportation home.   Final next level of care: Home/Self Care Barriers to Discharge: No Barriers Identified   Patient Goals and CMS Choice Patient states their goals for this hospitalization and ongoing recovery are:: get better   Choice offered to / list presented to : NA  Discharge Placement                       Discharge Plan and Services   Discharge Planning Services: CM Consult              DME Agency: NA       HH Arranged: NA          Social Determinants of Health (SDOH) Interventions     Readmission Risk Interventions Readmission Risk Prevention Plan 02/20/2020 12/03/2019  Transportation Screening Complete Complete  Medication Review Press photographer) Complete Complete  PCP or Specialist appointment within 3-5 days of discharge - Complete  HRI or [place] Complete Complete  SW Recovery Care/Counseling Consult Complete Complete  Palliative Care Screening Not Applicable Not [place] Not Applicable Not Applicable  Some recent data might be hidden

## 2020-02-25 ENCOUNTER — Other Ambulatory Visit: Payer: Medicare Other

## 2020-02-25 NOTE — Progress Notes (Addendum)
CARDIOLOGY OFFICE NOTE  Date:  03/10/2020    Ryan Copeland Date of Birth: 07/27/1950 Medical Record #[id number]  PCP:  [name]., PA-C  Cardiologist:  Servando Snare (transitioning over to Dr. Johney Frame)   Chief Complaint  Patient presents with  . Follow-up    History of Present Illness: Ryan Copeland is a 70 y.o. male who presents today for a follow up visit. Seen for Dr. Acie Fredrickson. Former patient of Dr. Susa Simmonds. Primarily follows with me. He is going to transition over to Dr. Johney Frame.     Has known prior NSTEMI and stenting of the LAD in 2011. Underwent repeat cath due to refractory symptoms back in August of 2016 - managed medically. 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 He sees neurology for his peripheral neuropathy.    Seen back in September of 2017 - had gained weight. Some intermittent chest heaviness - no response with NTG but I added Imdur to his regimen. When seen back in October - still with atypical chest pain - felt more like a pulled muscle but no relief with NSAID. He thought the Imdur had helped however. I talked with Dr. Acie Fredrickson - wanted to avoid Myoview - most likely it was felt to turn out abnormal. Elected to just follow for now. Ended up having GI work up in December of 2017- this was felt to be possibly causing some of his prior chest pain - turned out to be a yeast infection and with treatment his chest pain resolved. We got him off Imdur so he could resume Viagra. Still struggling with trying to stop smoking. He did have to go back on treatment for his CLL in 2018.    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 He had had a treatment reaction and had had hefty doses of Prednisone for a significant rash in the past. Had some swelling of his lower legs at a past visit  with me - not short of breath. I put him on very low dose prn Lasix. Not smoking. No chest pain. Cardiac status was felt to be stable.    He was vaccinated for COVID in August and in September - had CAP/septis and urinary retention in October and failed on outpatient therapy - ended up admitted - then readmitted with septic shock - was in the ICU - required Midodrine. Had pancytopenia. He has had a persistent left lower lobe consolidation - back on antibiotics. Concern for non-resolving or opportunistic infection. His CT showed new airspace disease with continued left lower lobe disease. For repeat CT in a few weeks. Sputum pending for AFB.   I saw him early last month - he had lost a significant amount of weight. Had been placed on Midodrine. Had an indwelling catheter due to urinary retention. Liberalizing his salt. There may be possible surgical intervention coming up.   He was admitted right after New Years with UTI - septic shock - altered mental status. Lopressor stopped due to asymptomatic bradycardia (however we have not had him on this in some time). Seen by GU - traumatic cath/penile pain. Pretty angry with his care at that time.    Comes in today. Here with Ryan Copeland his wife. She augments the history. He looks better today. Slowly gaining his strength back. Still with his foley in place. He  is seeing GU next week - then decision will be made about surgery - sounds like either a TURP or insertion of supra pubic catheter. His goal is to not have an indwelling catheter. No chest pain. Breathing has been stable. He is staying busy in the house. He is gathering wood in to burn - uses a dolly to push/pull it. He has put in a new garage remote. He does not like the winter. Overall, seems stronger. Using support stockings - drinking V8 juice - no syncope noted. BP staying right around 175 systolic. Seeing pulmonary soon and to have repeat CT scan. His last CXR - although one view - was improved.   Past  Medical History:  Diagnosis Date  . Anxiety 01/30/2014  . Back injury    lower disc  . CAD (coronary artery disease)   . CLL (chronic lymphocytic leukemia) ([place]) 03/18/2011  . COPD (chronic obstructive pulmonary disease) ([place])   . Diabetes mellitus ([place])    Type 2   . ECRB (extensor carpi radialis brevis) tenosynovitis   . GERD (gastroesophageal reflux disease)    takes Nexium if needed  . Hyperlipidemia   . Lateral epicondylitis of left elbow   . MI, acute, non ST segment elevation ([place]) 06/28/2009   with stenting of the LAD  . Neuromuscular disorder ([place])    peripheral neuropathy  . PVD (peripheral vascular disease) ([place])   . Tobacco abuse   . UTI (urinary tract infection)     Past Surgical History:  Procedure Laterality Date  . ADENOIDECTOMY  1955  . CARDIAC CATHETERIZATION    . CARDIAC CATHETERIZATION N/A 09/26/2014   Procedure: Left Heart Cath and Coronary Angiography;  Surgeon: Peter M Martinique, MD;  Location: [place] [facility];  Service: Cardiovascular;  Laterality: N/A;  . carpel tunnel release Left 04-1989  . carpel tunnel release  Right 01-1989  . CHOLECYSTECTOMY  2007  . CORONARY STENT PLACEMENT  May 2011  . femoral stents    . IR CV LINE INJECTION  08/18/2017  . IR CV LINE INJECTION  09/01/2017  . IR CV LINE INJECTION  02/02/2018  . LATERAL EPICONDYLE RELEASE Left 02/12/2014   Procedure: LEFT ELBOW DEBRIDEMENT WITH TENDON REPAIR ;  Surgeon: Lorn Junes, MD;  Location: [place];  Service: Orthopedics;  Laterality: Left;  . LEFT CAI STENT/PTA AND POPLITEAL ARTERY/TIBIAL THROMBECTOMY     . LEFT HEART CATHETERIZATION WITH CORONARY ANGIOGRAM N/A 08/26/2011   Procedure: LEFT HEART CATHETERIZATION WITH CORONARY ANGIOGRAM;  Surgeon: Peter M Martinique, MD;  Location[facility];  Service: Cardiovascular;  Laterality: N/A;  . PERIPHERAL VASCULAR CATHETERIZATION N/A 01/01/2015   Procedure: Abdominal Aortogram;  Surgeon: Conrad Spring Hill, MD;  Location: [place] [facility];  Service:  Cardiovascular;  Laterality: N/A;  . TARSAL TUNNEL RELEASE Bilateral 08-2007     Medications: Current Meds  Medication Sig  . acetaminophen (TYLENOL) 325 MG tablet Take 2 tablets (650 mg total) by mouth every 6 (six) hours as needed for moderate pain or fever.  Marland Kitchen acyclovir (ZOVIRAX) 400 MG tablet Take 1 tablet (400 mg total) by mouth 2 (two) times daily.  Marland Kitchen albuterol (PROVENTIL HFA) 108 (90 Base) MCG/ACT inhaler TAKE 2 PUFFS BY MOUTH EVERY 6 HOURS AS NEEDED FOR WHEEZE OR SHORTNESS OF BREATH  . allopurinol (ZYLOPRIM) 300 MG tablet TAKE 1 TABLET BY MOUTH EVERY DAY  . ALPRAZolam (XANAX) 0.5 MG tablet Take 0.5 mg by mouth daily.  Marland Kitchen b complex vitamins tablet Take 1  tablet by mouth daily.   . benzonatate (TESSALON) 100 MG capsule TAKE TWO CAPSULES BY MOUTH THREE TIMES A DAY AS NEEDED COUGH  . CALQUENCE 100 MG capsule TAKE 100 MG BY MOUTH 2 (TWO) TIMES DAILY.  Marland Kitchen Cholecalciferol 25 MCG (1000 UT) tablet Take 1,000 Units by mouth daily.   . famotidine (PEPCID) 20 MG tablet TAKE 1 TABLET BY MOUTH TWICE A DAY  . fluticasone furoate-vilanterol (BREO ELLIPTA) 200-25 MCG/INH AEPB Inhale 1 puff into the lungs daily.  . metFORMIN (GLUCOPHAGE-XR) 500 MG 24 hr tablet Take 500 mg by mouth daily with breakfast.  . midodrine (PROAMATINE) 5 MG tablet Take 1 tablet (5 mg total) by mouth 3 (three) times daily with meals.  . mirtazapine (REMERON) 15 MG tablet Take 1 tablet (15 mg total) by mouth at bedtime.  . mometasone (NASONEX) 50 MCG/ACT nasal spray Place 2 sprays into the nose daily.  . montelukast (SINGULAIR) 10 MG tablet Take 10 mg by mouth daily.  . nitroGLYCERIN (NITROSTAT) 0.4 MG SL tablet PLACE 1 TABLET UNDER THE TONGUE EVERY 5 MINUTES X 3 DOSES AS NEEDED FOR CHEST PAIN *MAX 3 DOSES*  . pregabalin (LYRICA) 200 MG capsule TAKE 1 CAPSULE (200 MG TOTAL) BY MOUTH 3 (THREE) TIMES DAILY.  . rosuvastatin (CRESTOR) 5 MG tablet TAKE 1 TABLET BY MOUTH EVERY DAY  . tamsulosin (FLOMAX) 0.4 MG CAPS capsule Take 1  capsule (0.4 mg total) by mouth daily.  Marland Kitchen zolpidem (AMBIEN) 10 MG tablet Take 10 mg by mouth at bedtime as needed.     Allergies: Allergies  Allergen Reactions  . Immune Globulin Hives and Rash    Pt reports hives/rash on body with IV Privigen. He was changed to IV Gamunex-C and had many infusions without adverse side effects.   . Codeine Hives    Pt states he can take a few, more reaction with extended doses.    Social History: The patient  reports that he quit smoking about 3 years ago. His smoking use included cigarettes. He has a 44.00 pack-year smoking history. He has quit using smokeless tobacco. He reports that he does not drink alcohol and does not use drugs.   Family History: The patient's family history includes Cancer in his mother; Heart disease in his father; Heart failure (age of onset: 21) in his father; Lung cancer (age of onset: 80) in his mother.   Review of Systems: Please see the history of present illness.   All other systems are reviewed and negative.   Physical Exam: VS:  BP 100/60 (BP Location: Left Arm, Patient Position: Sitting, Cuff Size: Normal)   Pulse 69   Ht 6' (1.829 m)   Wt 162 lb (73.5 kg)   SpO2 99%   BMI 21.97 kg/m  .  BMI Body mass index is 21.97 kg/m.  Wt Readings from Last 3 Encounters:  03/10/20 162 lb (73.5 kg)  03/06/20 167 lb 6.4 oz (75.9 kg)  02/20/20 155 lb 12.8 oz (70.7 kg)    General: Pleasant. Alert and in no acute distress.  He looks better today. His weight is unchanged since our last visit.  Cardiac: Regular rate and rhythm. No murmurs, rubs, or gallops. No edema.  Respiratory:  Lungs are fairly clear - congested cough still - decreased on the right side. GI: Soft and nontender.  MS: No deformity or atrophy. Gait and ROM intact.  Skin: Warm and dry. Color is normal.  Neuro:  Strength and sensation are intact and no gross focal  deficits noted.  Psych: Alert, appropriate and with normal affect.   LABORATORY  DATA:  EKG:  EKG is not ordered today.    Lab Results  Component Value Date   WBC 13.3 (H) 03/06/2020   HGB 11.3 (L) 03/06/2020   HCT 34.5 (L) 03/06/2020   PLT 58 (L) 03/06/2020   GLUCOSE 103 (H) 03/06/2020   CHOL 79 (L) 06/19/2019   TRIG 44 06/19/2019   HDL 34 (L) 06/19/2019   LDLDIRECT 117.6 08/22/2011   LDLCALC 33 06/19/2019   ALT 49 (H) 03/06/2020   AST 51 (H) 03/06/2020   NA 144 03/06/2020   K 4.0 03/06/2020   CL 110 03/06/2020   CREATININE 0.67 03/06/2020   BUN 10 03/06/2020   CO2 24 03/06/2020   TSH 1.202 01/01/2020   INR 1.3 (H) 02/15/2020   HGBA1C 4.5 (L) 02/15/2020     BNP (last 3 results) Recent Labs    12/15/19 0647 02/15/20 0503  BNP 203.2* 146.4*    ProBNP (last 3 results) No results for input(s): PROBNP in the last 8760 hours.   Other Studies Reviewed Today:  ECHO IMPRESSIONS 12/2019  1. Left ventricular ejection fraction, by estimation, is 60 to 65%. The  left ventricle has normal function. The left ventricle has no regional  wall motion abnormalities. Left ventricular diastolic parameters are  consistent with Grade I diastolic  dysfunction (impaired relaxation).  2. Right ventricular systolic function is normal. The right ventricular  size is normal. There is mildly elevated pulmonary artery systolic  pressure. The estimated right ventricular systolic pressure is 03.5 mmHg.  3. Left atrial size was moderately dilated.  4. The mitral valve is abnormal. Mild mitral valve regurgitation.  5. The aortic valve is tricuspid. Aortic valve regurgitation is not  visualized.  6. Aortic dilatation noted. There is mild dilatation of the aortic root,  measuring 39 mm.  7. The inferior vena cava is dilated in size with <50% respiratory  variability, suggesting right atrial pressure of 15 mmHg.     Coronary angiography from 09/2014:  Coronary dominance: right   Left mainstem: Normal.   Left anterior descending (LAD): The stent in the proximal  LAD is widely patent throughout. There is moderate 20% narrowing in the LAD proximal to the stent. The remainder of the vessels without significant disease.   Left circumflex (LCx): The left circumflex gives rise to 2 large marginal branches. There is 20% narrowing prior to the takeoff of the first OM.   Right coronary artery (RCA): The right coronary is a dominant vessel. It has diffuse 70% disease in the proximal to mid vessel. Is occluded at the crux. There are excellent right to right and left to right collaterals.   Left ventriculography: Left ventricular systolic function is normal, LVEF is estimated at 55-65%, there is no significant mitral regurgitation   Final Conclusions:   1. Single vessel obstructive coronary disease. 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 The stent in the proximal LAD is widely patent.   2. Normal LV function.   Recommendations: Continue medical management.   Peter Martinique   08/26/2011, 9:09 AM     ASSESSMENT & PLAN:     1. Pre op clearance - most likely looking at some type of GU surgery in next few weeks - he is waiting to discuss further and then decide based on what would look to be his best case scenario - he is an acceptable candidate from our  standpoint - I think his other issues put him more at risk - he has no worrisome cardiac symptoms - he is active within the confines of his house and relatively independent. EKG from earlier this month reviewed and unchanged. I have sent my note over to GU.   2. Small cell B cell lymphoma - has had treatment with Calquence - but treatment was complicated by recurrent infection/hospitalizations - this has been his most pressing issue.   3. Multifactorial pancytopenia - known splenomegaly - per Hematology/Oncology.   4. Hypogammaglobulinemia - on monthly IVIG - per hematology.  5. Recent septic/UTI - has indwelling catheter. See #1.   6. CAD - stable cath from 2016 - has total  occlusion of the RCA which is chronic - has good collateral flow noted at that time - patent stent in pLAD - he has no worrisome symptoms noted today.   7. History of HTN - but now hypotensive - requiring Midodrine - no syncope. BP still right at 403 systolic - he is using support stockings and liberalizing his salt.   8. COPD - followed by Pulmonary -  For upcoming CT per their report to follow up on a lung nodule.    9. HLD - on statin therapy  10. NIDDM - not discussed.   11. PAD - prior L CIA PTA+S, L pop artery thrombectomy by Dr. Amedeo Plenty - has not seen [facility] since 01/2016 - he has not felt that he needed follow up. Not discussed today.   Current medicines are reviewed with the patient today.  The patient does not have concerns regarding medicines other than what has been noted above.  The following changes have been made:  See above.  Labs/ tests ordered today include:   No orders of the defined types were placed in this encounter.    Disposition:   FU with Dr. Johney Frame in 4 months. She was able to say hello to them both today.     Patient is agreeable to this plan and will call if any problems develop in the interim.   SignedTruitt Merle, NP  03/10/2020 11:52 AM  [place] [address] [place], [place]  [address] Phone: [phone] Fax: [phone]

## 2020-02-26 ENCOUNTER — Ambulatory Visit: Payer: Medicare Other | Admitting: Internal Medicine

## 2020-03-05 ENCOUNTER — Telehealth: Payer: Self-pay | Admitting: Internal Medicine

## 2020-03-05 NOTE — Telephone Encounter (Signed)
[facility] to speak with Marlowe Kays.  LM for Marlowe Kays to return call.   Patient LOV was 01/28/20, with Dr. Shearon Stalls.  In AVS Dr. Shearon Stalls states ok for bladder surgery.

## 2020-03-06 ENCOUNTER — Encounter: Payer: Self-pay | Admitting: Hematology and Oncology

## 2020-03-06 ENCOUNTER — Inpatient Hospital Stay: Payer: Medicare Other | Attending: Hematology and Oncology

## 2020-03-06 ENCOUNTER — Inpatient Hospital Stay (HOSPITAL_BASED_OUTPATIENT_CLINIC_OR_DEPARTMENT_OTHER): Payer: Medicare Other | Admitting: Hematology and Oncology

## 2020-03-06 ENCOUNTER — Other Ambulatory Visit: Payer: Self-pay

## 2020-03-06 ENCOUNTER — Inpatient Hospital Stay: Payer: Medicare Other

## 2020-03-06 VITALS — BP 97/53 | HR 60 | Temp 98.0°F | Resp 18

## 2020-03-06 DIAGNOSIS — D61818 Other pancytopenia: Secondary | ICD-10-CM

## 2020-03-06 DIAGNOSIS — D801 Nonfamilial hypogammaglobulinemia: Secondary | ICD-10-CM

## 2020-03-06 DIAGNOSIS — E538 Deficiency of other specified B group vitamins: Secondary | ICD-10-CM | POA: Insufficient documentation

## 2020-03-06 DIAGNOSIS — C8308 Small cell B-cell lymphoma, lymph nodes of multiple sites: Secondary | ICD-10-CM

## 2020-03-06 DIAGNOSIS — R161 Splenomegaly, not elsewhere classified: Secondary | ICD-10-CM | POA: Diagnosis not present

## 2020-03-06 DIAGNOSIS — Z79899 Other long term (current) drug therapy: Secondary | ICD-10-CM | POA: Diagnosis not present

## 2020-03-06 DIAGNOSIS — Z95828 Presence of other vascular implants and grafts: Secondary | ICD-10-CM

## 2020-03-06 LAB — COMPREHENSIVE METABOLIC PANEL
ALT: 49 U/L — ABNORMAL HIGH (ref 0–44)
AST: 51 U/L — ABNORMAL HIGH (ref 15–41)
Albumin: 3.1 g/dL — ABNORMAL LOW (ref 3.5–5.0)
Alkaline Phosphatase: 95 U/L (ref 38–126)
Anion gap: 10 (ref 5–15)
BUN: 10 mg/dL (ref 8–23)
CO2: 24 mmol/L (ref 22–32)
Calcium: 8.4 mg/dL — ABNORMAL LOW (ref 8.9–10.3)
Chloride: 110 mmol/L (ref 98–111)
Creatinine, Ser: 0.67 mg/dL (ref 0.61–1.24)
GFR, Estimated: >60 mL/min (ref >60)
Glucose, Bld: 103 mg/dL — ABNORMAL HIGH (ref 70–99)
Potassium: 4 mmol/L (ref 3.5–5.1)
Sodium: 144 mmol/L (ref 135–145)
Total Bilirubin: 0.4 mg/dL (ref 0.3–1.2)
Total Protein: 5.4 g/dL — ABNORMAL LOW (ref 6.5–8.1)

## 2020-03-06 LAB — CBC WITH DIFFERENTIAL/PLATELET
Abs Immature Granulocytes: 0 K/uL (ref 0.00–0.07)
Band Neutrophils: 1 %
Basophils Absolute: 0.1 K/uL (ref 0.0–0.1)
Basophils Relative: 1 %
Eosinophils Absolute: 0.1 K/uL (ref 0.0–0.5)
Eosinophils Relative: 1 %
HCT: 34.5 % — ABNORMAL LOW (ref 39.0–52.0)
Hemoglobin: 11.3 g/dL — ABNORMAL LOW (ref 13.0–17.0)
Lymphocytes Relative: 82 %
Lymphs Abs: 10.9 K/uL — ABNORMAL HIGH (ref 0.7–4.0)
MCH: 33.4 pg (ref 26.0–34.0)
MCHC: 32.8 g/dL (ref 30.0–36.0)
MCV: 102.1 fL — ABNORMAL HIGH (ref 80.0–100.0)
Monocytes Absolute: 0.1 K/uL (ref 0.1–1.0)
Monocytes Relative: 1 %
Neutro Abs: 2 K/uL (ref 1.7–7.7)
Neutrophils Relative %: 14 %
Platelets: 58 K/uL — ABNORMAL LOW (ref 150–400)
RBC: 3.38 MIL/uL — ABNORMAL LOW (ref 4.22–5.81)
RDW: 17.5 % — ABNORMAL HIGH (ref 11.5–15.5)
WBC: 13.3 K/uL — ABNORMAL HIGH (ref 4.0–10.5)
nRBC: 0 % (ref 0.0–0.2)

## 2020-03-06 MED ORDER — METHYLPREDNISOLONE SODIUM SUCC 40 MG IJ SOLR
40.0000 mg | Freq: Once | INTRAMUSCULAR | Status: AC
  Administered 2020-03-06: 40 mg via INTRAVENOUS

## 2020-03-06 MED ORDER — CYANOCOBALAMIN 1000 MCG/ML IJ SOLN
1000.0000 ug | Freq: Once | INTRAMUSCULAR | Status: AC
  Administered 2020-03-06: 1000 ug via INTRAMUSCULAR

## 2020-03-06 MED ORDER — SODIUM CHLORIDE 0.9% FLUSH
10.0000 mL | Freq: Once | INTRAVENOUS | Status: AC
  Administered 2020-03-06: 10 mL
  Filled 2020-03-06: qty 10

## 2020-03-06 MED ORDER — DIPHENHYDRAMINE HCL 25 MG PO CAPS
ORAL_CAPSULE | ORAL | Status: AC
  Filled 2020-03-06: qty 1

## 2020-03-06 MED ORDER — SODIUM CHLORIDE 0.9 % IV SOLN
INTRAVENOUS | Status: DC
  Filled 2020-03-06: qty 250

## 2020-03-06 MED ORDER — METHYLPREDNISOLONE SODIUM SUCC 40 MG IJ SOLR
INTRAMUSCULAR | Status: AC
  Filled 2020-03-06: qty 1

## 2020-03-06 MED ORDER — ACETAMINOPHEN 325 MG PO TABS
650.0000 mg | ORAL_TABLET | Freq: Once | ORAL | Status: AC
  Administered 2020-03-06: 650 mg via ORAL

## 2020-03-06 MED ORDER — IMMUNE GLOBULIN (HUMAN) 10 GM/100ML IJ SOLN
0.5000 g/kg | Freq: Once | INTRAMUSCULAR | Status: AC
  Administered 2020-03-06: 40 g via INTRAVENOUS
  Filled 2020-03-06: qty 400

## 2020-03-06 MED ORDER — CYANOCOBALAMIN 1000 MCG/ML IJ SOLN
INTRAMUSCULAR | Status: AC
  Filled 2020-03-06: qty 1

## 2020-03-06 MED ORDER — ACETAMINOPHEN 325 MG PO TABS
ORAL_TABLET | ORAL | Status: AC
  Filled 2020-03-06: qty 2

## 2020-03-06 MED ORDER — DIPHENHYDRAMINE HCL 25 MG PO TABS
25.0000 mg | ORAL_TABLET | Freq: Once | ORAL | Status: AC
  Administered 2020-03-06: 25 mg via ORAL
  Filled 2020-03-06: qty 1

## 2020-03-06 MED ORDER — HEPARIN SOD (PORK) LOCK FLUSH 100 UNIT/ML IV SOLN
500.0000 [IU] | Freq: Once | INTRAVENOUS | Status: AC
  Administered 2020-03-06: 500 [IU]
  Filled 2020-03-06: qty 5

## 2020-03-06 NOTE — Telephone Encounter (Signed)
Ryan Copeland from [facility] is calling with fax number. Fax number is [phone]. Ryan Copeland phone number is [phone].

## 2020-03-06 NOTE — Assessment & Plan Note (Signed)
We discussed monthly IVIG for the next few months and he is in agreement to proceed

## 2020-03-06 NOTE — Assessment & Plan Note (Signed)
He tolerated Calquence well but his treatment course is complicated by recurrent infection and hospitalizations We will continue treatment as scheduled I recommend IVIG treatment along with aggressive supportive care

## 2020-03-06 NOTE — Telephone Encounter (Signed)
OV note has been faxed to [facility] at [facility].

## 2020-03-06 NOTE — Assessment & Plan Note (Signed)
He has multifactorial pancytopenia He is known to have splenomegaly 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, recent drop is likely exacerbated by infection From the anemia standpoint, I suspect is a component of anemia of chronic disease along with vitamin B12 deficiency He does not need transfusion support

## 2020-03-06 NOTE — Progress Notes (Signed)
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 [facility]) He tolerated [place]ence well but his treatment course is complicated by recurrent infection and hospitalizations We will continue treatment as scheduled I recommend IVIG treatment along with aggressive supportive care  Pancytopenia, acquired [facility]) He has multifactorial pancytopenia He is known to have 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   All questions were answered. The patient knows to call the clinic with any problems, questions or concerns. The total time spent in the appointment was 20 minutes encounter with patients including review of chart and various tests results, discussions about plan of care and coordination of care plan   [name], MD 03/06/2020 4:58 PM  INTERVAL HISTORY: Please see below for problem oriented charting. He returns for further follow-up and IVIG He was recently hospitalized for infection He has improved in general Appetite is good Denies recent cough, chest pain or shortness of breath The patient denies any recent signs or symptoms of bleeding such as spontaneous epistaxis, hematuria or hematochezia.   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  No evidence of aortic dissection or other acute process in the chest. 2.  Centrilobular emphysema with a 5 mm right lung nodule. Given the concurrent centrilobular emphysema, follow-up chest CT at 6 -12 months is recommended.  3.  Coronary artery atherosclerosis which is age advanced. 4.  Prominent thoracic lymph nodes.  These can be reevaluated at follow-up.   05/06/2010 Imaging   1.  Multiple small periaortic lymph nodes consistent with the patient's history of the chronic lymphocytic leukemia. 2.  No evidence of solid organ involvement   11/03/2011 Imaging   1.  Interval progression of abdominal and pelvic adenopathy. 2.  Progression of splenomegaly.  The spleen now measures 23 cm in length   11/18/2011 Bone Marrow Biopsy   Bone Marrow, Aspirate,Biopsy, and Clot, right iliac bone - HYPERCELLULAR BONE MARROW WITH EXTENSIVE INVOLVEMENT BY CHRONIC LYMPHOCYTIC LEUKEMIA. PERIPHERAL BLOOD: - CHRONIC LYMPHOCYTIC LEUKEMIA   03/08/2012 Imaging   1.  Progressive increase in retroperitoneal, iliac, and inguinal lymphadenopathy. 2.  Interval increase in massive splenomegaly.    06/01/2012 Procedure   Placement of single lumen port a cath via right internal jugular vein.  The catheter tip lies at the cavoatrial junction.  A power injectable port a cath was placed and is ready for immediate use   06/20/2012 - 11/23/2012 Chemotherapy   He received FCR x 6 cycles   12/19/2012 Imaging   Left common iliac stent. Abdominal vasculature remains patent. Improving supraclavicular and axillary lymphadenopathy. Residual right subpectoral nodes measure up to 10 mm short axis. Improving retroperitoneal lymphadenopathy, measuring up to 16 mm short axis. Improving  splenomegaly, measuring 18.7 cm.    01/20/2016 Imaging   1. Stable exam.  No new or progressive findings. 2. No CT findings to  explain odynophagia   09/02/2016 Pathology Results   The findings are consistent with involvement by previously known chronic lymphocytic leukemia   09/02/2016 Pathology Results   FISH for CLL came back positive for deletion 13q   09/15/2016 Imaging   1. Borderline enlarged abdominal and pelvic lymph nodes. Compared with 11/03/2011 these are decreased in size as detailed above. 2. Persistent splenomegaly. 3. Aortic Atherosclerosis (ICD10-I70.0). LAD coronary artery calcification noted.   10/06/2016 - 02/24/2017 Chemotherapy   He received Bendamustine and Rituxan   11/03/2016 Adverse Reaction   Dose of Bendamustine is reduced due to severe pancytopenia   12/28/2016 Imaging   1. Borderline enlarged abdominal peritoneal ligament and abdominal retroperitoneal lymph nodes, stable. 2. Splenomegaly. 3.  Aortic atherosclerosis (ICD10-170.0).   03/22/2017 PET scan   1. No hypermetabolic adenopathy identified within the neck, chest, abdomen or pelvis. 2. Prominent left retroperitoneal node measures 1.6 cm without significant FDG uptake. 3. Splenomegaly. 4. Aortic Atherosclerosis (ICD10-I70.0) and Emphysema (ICD10-J43.9). LAD and left circumflex atherosclerotic calcifications noted.   02/02/2018 Procedure   IMPRESSION: Widely patent right IJ power port catheter.   04/06/2018 Imaging   1. Interval enlargement of axillary, mediastinal, and retroperitoneal lymph nodes, as well as increased splenomegaly, findings concerning for progression of CLL in comparison to prior PET-CT dated 03/22/2017.  2.  Other chronic and incidental findings as detailed above.    04/16/2018 -  Chemotherapy   The patient had acalabrutinib for chemo   04/17/2018 - 04/21/2018 Hospital Admission   He was hospitalized for influenza   11/29/2018 Imaging   1. Interval response to therapy. No thoracic added not scratch set no thoracic or pelvic adenopathy identified. Significant improvement in abdominal adenopathy. Largest  remaining lymph node measures 1.3cm in the left retroperitoneal region. Previously 2.5 cm. 2. Persistent splenomegaly, improved from previous exam. 3. No new or progressive disease identified within the chest, abdomen or pelvis. 4. Stable appearance of 5 mm right middle lobe lung nodule. 5. Aortic Atherosclerosis (ICD10-I70.0) and Emphysema (ICD10-J43.9). Coronary artery calcifications.       REVIEW OF SYSTEMS:   Constitutional: Denies fevers, chills or abnormal weight loss Eyes: Denies blurriness of vision Ears, nose, mouth, throat, and face: Denies mucositis or sore throat Respiratory: Denies cough, dyspnea or wheezes Cardiovascular: Denies palpitation, chest discomfort or lower extremity swelling Gastrointestinal:  Denies nausea, heartburn or change in bowel habits Skin: Denies abnormal skin rashes Lymphatics: Denies new lymphadenopathy or easy bruising Neurological:Denies numbness, tingling or new weaknesses Behavioral/Psych: Mood is stable, no new changes  All other systems were reviewed with the patient and are negative.  I have reviewed the past medical history, past surgical history, social history and family history with the patient and they are unchanged from previous note.  ALLERGIES:  is allergic to immune globulin and codeine.  MEDICATIONS:  Current Outpatient Medications  Medication Sig Dispense Refill  . acetaminophen (TYLENOL) 325 MG tablet Take 2 tablets (650 mg total) by mouth every 6 (six) hours as needed for moderate pain or fever. 30 tablet 0  . acyclovir (ZOVIRAX) 400 MG tablet Take 1 tablet (400 mg total) by mouth 2 (two) times daily. 180 tablet 1  . albuterol (PROVENTIL HFA) 108 (90 Base) MCG/ACT inhaler TAKE 2 PUFFS BY MOUTH EVERY 6 HOURS AS NEEDED FOR WHEEZE OR SHORTNESS OF BREATH (Patient taking differently: Inhale 2 puffs  into the lungs every 6 (six) hours as needed for wheezing.) 18 g 11  . allopurinol (ZYLOPRIM) 300 MG tablet TAKE 1 TABLET BY MOUTH EVERY  DAY (Patient taking differently: Take 300 mg by mouth daily.) 90 tablet 1  . ALPRAZolam (XANAX) 0.5 MG tablet Take 0.5 mg by mouth daily.    Marland Kitchen b complex vitamins tablet Take 1 tablet by mouth daily.     . benzonatate (TESSALON) 100 MG capsule TAKE TWO CAPSULES BY MOUTH THREE TIMES A DAY AS NEEDED COUGH (Patient taking differently: Take 100 mg by mouth daily as needed for cough.) 30 capsule 2  . CALQUENCE 100 MG capsule TAKE 100 MG BY MOUTH 2 (TWO) TIMES DAILY. (Patient taking differently: Take 100 mg by mouth 2 (two) times daily.) 60 capsule 11  . Cholecalciferol 25 MCG (1000 UT) tablet Take 1,000 Units by mouth daily.     . famotidine (PEPCID) 20 MG tablet TAKE 1 TABLET BY MOUTH TWICE A DAY 180 tablet 1  . fluticasone furoate-vilanterol (BREO ELLIPTA) 200-25 MCG/INH AEPB Inhale 1 puff into the lungs daily. 1 each 5  . metFORMIN (GLUCOPHAGE-XR) 500 MG 24 hr tablet Take 500 mg by mouth daily with breakfast.    . midodrine (PROAMATINE) 5 MG tablet Take 1 tablet (5 mg total) by mouth 3 (three) times daily with meals. 270 tablet 3  . mirtazapine (REMERON) 15 MG tablet Take 1 tablet (15 mg total) by mouth at bedtime. 30 tablet 0  . mometasone (NASONEX) 50 MCG/ACT nasal spray Place 2 sprays into the nose daily. 1 each 11  . nitroGLYCERIN (NITROSTAT) 0.4 MG SL tablet PLACE 1 TABLET UNDER THE TONGUE EVERY 5 MINUTES X 3 DOSES AS NEEDED FOR CHEST PAIN *MAX 3 DOSES* (Patient taking differently: Place 0.4 mg under the tongue every 5 (five) minutes as needed for chest pain (max 3 doses).) 25 tablet 3  . pregabalin (LYRICA) 200 MG capsule TAKE 1 CAPSULE (200 MG TOTAL) BY MOUTH 3 (THREE) TIMES DAILY. 270 capsule 1  . rosuvastatin (CRESTOR) 5 MG tablet TAKE 1 TABLET BY MOUTH EVERY DAY (Patient taking differently: Take 5 mg by mouth daily.) 90 tablet 3  . tamsulosin (FLOMAX) 0.4 MG CAPS capsule Take 1 capsule (0.4 mg total) by mouth daily. (Patient taking differently: Take 0.4 mg by mouth at bedtime.) 30 capsule 0    No current facility-administered medications for this visit.   Facility-Administered Medications Ordered in Other Visits  Medication Dose Route Frequency Provider Last Rate Last Admin  . 0.9 %  sodium chloride infusion   Intravenous Continuous [name], MD 50 mL/hr at 03/07/14 1005 New Bag at 03/07/14 1005  . 0.9 %  sodium chloride infusion   Intravenous Continuous [name], MD   Stopped at 03/06/20 1420  . sodium chloride 0.9 % injection 10 mL  10 mL Intracatheter PRN Marcy Panning, MD   10 mL at 08/22/12 1721    PHYSICAL EXAMINATION: ECOG PERFORMANCE STATUS: 1 - Symptomatic but completely ambulatory  Vitals:   03/06/20 0928  BP: (!) 100/54  Pulse: 70  Resp: 18  Temp: 97.7 F (36.5 C)  SpO2: 100%   Filed Weights   03/06/20 0928  Weight: 167 lb 6.4 oz (75.9 kg)    GENERAL:alert, no distress and comfortable SKIN: Noted extensive skin bruises EYES: normal, Conjunctiva are pink and non-injected, sclera clear OROPHARYNX:no exudate, no erythema and lips, buccal mucosa, and tongue normal  NECK: supple, thyroid normal size, non-tender, without nodularity LYMPH:  no palpable  lymphadenopathy in the cervical, axillary or inguinal LUNGS: clear to auscultation and percussion with normal breathing effort HEART: regular rate & rhythm and no murmurs and no lower extremity edema ABDOMEN:abdomen soft, non-tender and normal bowel sounds Musculoskeletal:no cyanosis of digits and no clubbing  NEURO: alert & oriented x 3 with fluent speech, no focal motor/sensory deficits  LABORATORY DATA:  I have reviewed the data as listed    Component Value Date/Time   NA 144 03/06/2020 0911   NA 142 06/19/2019 0848   NA 136 01/26/2017 0940   K 4.0 03/06/2020 0911   K 4.0 01/26/2017 0940   CL 110 03/06/2020 0911   CL 104 08/03/2012 1229   CO2 24 03/06/2020 0911   CO2 23 01/26/2017 0940   GLUCOSE 103 (H) 03/06/2020 0911   GLUCOSE 146 (H) 01/26/2017 0940   GLUCOSE 223 (H) 08/03/2012 1229    BUN 10 03/06/2020 0911   BUN 13 06/19/2019 0848   BUN 9.6 01/26/2017 0940   CREATININE 0.67 03/06/2020 0911   CREATININE 1.04 04/24/2018 0821   CREATININE 1.0 01/26/2017 0940   CALCIUM 8.4 (L) 03/06/2020 0911   CALCIUM 8.7 01/26/2017 0940   PROT 5.4 (L) 03/06/2020 0911   PROT 5.9 (L) 04/13/2017 0823   PROT 5.7 (L) 01/26/2017 0940   ALBUMIN 3.1 (L) 03/06/2020 0911   ALBUMIN 3.6 01/26/2017 0940   AST 51 (H) 03/06/2020 0911   AST 13 (L) 04/24/2018 0821   AST 13 01/26/2017 0940   ALT 49 (H) 03/06/2020 0911   ALT 10 04/24/2018 0821   ALT 12 01/26/2017 0940   ALKPHOS 95 03/06/2020 0911   ALKPHOS 78 01/26/2017 0940   BILITOT 0.4 03/06/2020 0911   BILITOT 0.7 04/24/2018 0821   BILITOT 0.64 01/26/2017 0940   GFRNONAA >60 03/06/2020 0911   GFRNONAA >60 04/24/2018 0821   GFRAA >60 10/25/2019 0819   GFRAA >60 04/24/2018 0821    No results found for: SPEP, UPEP  Lab Results  Component Value Date   WBC 13.3 (H) 03/06/2020   NEUTROABS 2.0 03/06/2020   HGB 11.3 (L) 03/06/2020   HCT 34.5 (L) 03/06/2020   MCV 102.1 (H) 03/06/2020   PLT 58 (L) 03/06/2020      Chemistry      Component Value Date/Time   NA 144 03/06/2020 0911   NA 142 06/19/2019 0848   NA 136 01/26/2017 0940   K 4.0 03/06/2020 0911   K 4.0 01/26/2017 0940   CL 110 03/06/2020 0911   CL 104 08/03/2012 1229   CO2 24 03/06/2020 0911   CO2 23 01/26/2017 0940   BUN 10 03/06/2020 0911   BUN 13 06/19/2019 0848   BUN 9.6 01/26/2017 0940   CREATININE 0.67 03/06/2020 0911   CREATININE 1.04 04/24/2018 0821   CREATININE 1.0 01/26/2017 0940      Component Value Date/Time   CALCIUM 8.4 (L) 03/06/2020 0911   CALCIUM 8.7 01/26/2017 0940   ALKPHOS 95 03/06/2020 0911   ALKPHOS 78 01/26/2017 0940   AST 51 (H) 03/06/2020 0911   AST 13 (L) 04/24/2018 0821   AST 13 01/26/2017 0940   ALT 49 (H) 03/06/2020 0911   ALT 10 04/24/2018 0821   ALT 12 01/26/2017 0940   BILITOT 0.4 03/06/2020 0911   BILITOT 0.7 04/24/2018 0821    BILITOT 0.64 01/26/2017 0940       RADIOGRAPHIC STUDIES: I have personally reviewed the radiological images as listed and agreed with the findings in the report. DG Chest  Port 1 View  Result Date: 02/15/2020 CLINICAL DATA:  Sepsis EXAM: PORTABLE CHEST 1 VIEW COMPARISON:  12/18/2019 FINDINGS: Improved aeration of the lungs. Unchanged position of right chest wall Port-A-Cath. No pleural effusion or pneumothorax. Normal cardiomediastinal contours. IMPRESSION: Improved aeration of the lungs without new area of consolidation. Electronically Signed   By: Ulyses Jarred M.D.   On: 02/15/2020 05:32   CT RENAL STONE STUDY  Result Date: 02/16/2020 CLINICAL DATA:  70 year old male with history of flank pain. Suspected pseudomonal urinary tract infection. EXAM: CT ABDOMEN AND PELVIS WITHOUT CONTRAST TECHNIQUE: Multidetector CT imaging of the abdomen and pelvis was performed following the standard protocol without IV contrast. COMPARISON:  CT the abdomen and pelvis 04/06/2018. FINDINGS: Lower chest: Airspace consolidation in the lower lobes of the lungs bilaterally (right greater than left). Trace bilateral pleural effusions (right greater than left). Atherosclerotic calcifications in the descending thoracic aorta as well as the right coronary artery. Hepatobiliary: No definite suspicious cystic or solid hepatic lesions are confidently identified on today's noncontrast CT examination. Status post cholecystectomy. Pancreas: No definite pancreatic mass or peripancreatic fluid collections or inflammatory changes are noted on today's noncontrast CT examination. Spleen: Spleen is enlarged measuring 12.3 x 8.9 x 20.3 cm (estimated splenic volume of 1,111 mL) . Adrenals/Urinary Tract: There are no abnormal calcifications within the collecting system of either kidney, along the course of either ureter, or within the lumen of the urinary bladder. No hydroureteronephrosis or perinephric stranding to suggest urinary tract  obstruction at this time. The unenhanced appearance of the kidneys is unremarkable bilaterally. Urinary bladder is completely decompressed around an indwelling Foley balloon catheter. Haziness in the perivesical fat. Unenhanced appearance of the adrenal glands is normal. Stomach/Bowel: Unenhanced appearance of the stomach is normal. No pathologic dilatation of small bowel or colon. The appendix is not confidently identified and may be surgically absent. Regardless, there are no inflammatory changes noted adjacent to the cecum to suggest the presence of an acute appendicitis at this time. Vascular/Lymphatic: Aortic atherosclerosis. No lymphadenopathy noted in the abdomen or pelvis. Reproductive: Prostate gland and seminal vesicles are unremarkable in appearance. Other: Edema in the presacral space. No significant volume of ascites. No pneumoperitoneum. Musculoskeletal: There are no aggressive appearing lytic or blastic lesions noted in the visualized portions of the skeleton. IMPRESSION: 1. Extensive soft tissue stranding in the perivesical fat, likely from underlying cystitis. 2. No urinary tract calculi. No findings of urinary tract obstruction. 3. Lower lobe airspace consolidation bilaterally (right greater than left) compatible with pneumonia, with trace bilateral parapneumonic pleural effusions. 4. Splenomegaly. 5. Aortic atherosclerosis, in addition to least right coronary artery disease. Please note that although the presence of coronary artery calcium documents the presence of coronary artery disease, the severity of this disease and any potential stenosis cannot be assessed on this non-gated CT examination. Assessment for potential risk factor modification, dietary therapy or pharmacologic therapy may be warranted, if clinically indicated. 6. Additional incidental findings, as above. Electronically Signed   By: Vinnie Langton M.D.   On: 02/16/2020 20:07

## 2020-03-06 NOTE — Telephone Encounter (Signed)
Attempted to call [facility] with [facility] but unable to reach. Left message for her to return call. We need fax number so we can fax pt's last AVS to [place].

## 2020-03-06 NOTE — Patient Instructions (Signed)
Immune Globulin Injection What is this medicine? IMMUNE GLOBULIN (im MUNE GLOB yoo lin) helps to prevent or reduce the severity of certain infections in patients who are at risk. This medicine is collected from the pooled blood of many donors. It is used to treat immune system problems, thrombocytopenia, and Kawasaki syndrome. This medicine may be used for other purposes; ask your health care provider or pharmacist if you have questions. COMMON BRAND NAME(S): ASCENIV, Baygam, BIVIGAM, Carimune, Carimune NF, cutaquig, Cuvitru, Flebogamma, Flebogamma DIF, GamaSTAN, GamaSTAN S/D, Gamimune N, Gammagard, Gammagard S/D, Gammaked, Gammaplex, Gammar-P IV, Gamunex, Gamunex-C, Hizentra, Iveegam, Iveegam EN, Octagam, Panglobulin, Panglobulin NF, panzyga, Polygam S/D, Privigen, Sandoglobulin, Venoglobulin-S, Vigam, Vivaglobulin, Xembify What should I tell my health care provider before I take this medicine? They need to know if you have any of these conditions:  diabetes  extremely low or no immune antibodies in the blood  heart disease  history of blood clots  hyperprolinemia  infection in the blood, sepsis  kidney disease  recently received or scheduled to receive a vaccination  an unusual or allergic reaction to human immune globulin, albumin, maltose, sucrose, other medicines, foods, dyes, or preservatives  pregnant or trying to get pregnant  breast-feeding How should I use this medicine? This medicine is for injection into a muscle or infusion into a vein or skin. It is usually given by a health care professional in a hospital or clinic setting. In rare cases, some brands of this medicine might be given at home. You will be taught how to give this medicine. Use exactly as directed. Take your medicine at regular intervals. Do not take your medicine more often than directed. Talk to your pediatrician regarding the use of this medicine in children. While this drug may be prescribed for selected  conditions, precautions do apply. Overdosage: If you think you have taken too much of this medicine contact a poison control center or emergency room at once. NOTE: This medicine is only for you. Do not share this medicine with others. What if I miss a dose? It is important not to miss your dose. Call your doctor or health care professional if you are unable to keep an appointment. If you give yourself the medicine and you miss a dose, take it as soon as you can. If it is almost time for your next dose, take only that dose. Do not take double or extra doses. What may interact with this medicine?  aspirin and aspirin-like medicines  cisplatin  cyclosporine  medicines for infection like acyclovir, adefovir, amphotericin B, bacitracin, cidofovir, foscarnet, ganciclovir, gentamicin, pentamidine, vancomycin  NSAIDS, medicines for pain and inflammation, like ibuprofen or naproxen  pamidronate  vaccines  zoledronic acid This list may not describe all possible interactions. Give your health care provider a list of all the medicines, herbs, non-prescription drugs, or dietary supplements you use. Also tell them if you smoke, drink alcohol, or use illegal drugs. Some items may interact with your medicine. What should I watch for while using this medicine? Your condition will be monitored carefully while you are receiving this medicine. This medicine is made from pooled blood donations of many different people. It may be possible to pass an infection in this medicine. However, the donors are screened for infections and all products are tested for HIV and hepatitis. The medicine is treated to kill most or all bacteria and viruses. Talk to your doctor about the risks and benefits of this medicine. Do not have vaccinations for at least   14 days before, or until at least 3 months after receiving this medicine. What side effects may I notice from receiving this medicine? Side effects that you should report  to your doctor or health care professional as soon as possible:  allergic reactions like skin rash, itching or hives, swelling of the face, lips, or tongue  blue colored lips or skin  breathing problems  chest pain or tightness  fever  signs and symptoms of aseptic meningitis such as stiff neck; sensitivity to light; headache; drowsiness; fever; nausea; vomiting; rash  signs and symptoms of a blood clot such as chest pain; shortness of breath; pain, swelling, or warmth in the leg  signs and symptoms of hemolytic anemia such as fast heartbeat; tiredness; dark yellow or brown urine; or yellowing of the eyes or skin  signs and symptoms of kidney injury like trouble passing urine or change in the amount of urine  sudden weight gain  swelling of the ankles, feet, hands Side effects that usually do not require medical attention (report to your doctor or health care professional if they continue or are bothersome):  diarrhea  flushing  headache  increased sweating  joint pain  muscle cramps  muscle pain  nausea  pain, redness, or irritation at site where injected  tiredness This list may not describe all possible side effects. Call your doctor for medical advice about side effects. You may report side effects to FDA at 1-800-FDA-1088. Where should I keep my medicine? Keep out of the reach of children. This drug is usually given in a hospital or clinic and will not be stored at home. In rare cases, some brands of this medicine may be given at home. If you are using this medicine at home, you will be instructed on how to store this medicine. Throw away any unused medicine after the expiration date on the label. NOTE: This sheet is a summary. It may not cover all possible information. If you have questions about this medicine, talk to your doctor, pharmacist, or health care provider.  2021 Elsevier/Gold Standard (2018-09-05 12:51:14)  

## 2020-03-06 NOTE — Progress Notes (Signed)
MD states okay to treat with abnormal labs

## 2020-03-07 LAB — IGG: IgG (Immunoglobin G), Serum: 598 mg/dL — ABNORMAL LOW (ref 603–1613)

## 2020-03-08 ENCOUNTER — Other Ambulatory Visit: Payer: Self-pay | Admitting: Neurology

## 2020-03-10 ENCOUNTER — Ambulatory Visit (INDEPENDENT_AMBULATORY_CARE_PROVIDER_SITE_OTHER): Payer: Medicare Other | Admitting: Nurse Practitioner

## 2020-03-10 ENCOUNTER — Encounter: Payer: Self-pay | Admitting: Nurse Practitioner

## 2020-03-10 ENCOUNTER — Other Ambulatory Visit: Payer: Self-pay

## 2020-03-10 VITALS — BP 100/60 | HR 69 | Ht 72.0 in | Wt 162.0 lb

## 2020-03-10 DIAGNOSIS — I10 Essential (primary) hypertension: Secondary | ICD-10-CM | POA: Diagnosis not present

## 2020-03-10 DIAGNOSIS — I951 Orthostatic hypotension: Secondary | ICD-10-CM

## 2020-03-10 DIAGNOSIS — Z01810 Encounter for preprocedural cardiovascular examination: Secondary | ICD-10-CM | POA: Diagnosis not present

## 2020-03-10 DIAGNOSIS — E7800 Pure hypercholesterolemia, unspecified: Secondary | ICD-10-CM | POA: Diagnosis not present

## 2020-03-10 DIAGNOSIS — I259 Chronic ischemic heart disease, unspecified: Secondary | ICD-10-CM

## 2020-03-10 NOTE — Patient Instructions (Addendum)
After Visit Summary:  We will be checking the following labs today - NONE   Medication Instructions:    Continue with your current medicines.    If you need a refill on your cardiac medications before your next appointment, please call your pharmacy.     Testing/Procedures To Be Arranged:  N/A  Follow-Up:   See Dr. Gwyndolyn Kaufman in 4 months    At [facility], you and your health needs are our priority.  As part of our continuing mission to provide you with exceptional heart care, we have created designated Provider Care Teams.  These Care Teams include your primary Cardiologist (physician) and Advanced Practice Providers (APPs -  Physician Assistants and Nurse Practitioners) who all work together to provide you with the care you need, when you need it.  Special Instructions:  . Stay safe, wash your hands for at least 20 seconds and wear a mask when needed.  . It was good to talk with you today.  . Thank you both!!   Call the [place] office at [phone] if you have any questions, problems or concerns.

## 2020-03-11 ENCOUNTER — Other Ambulatory Visit: Payer: Self-pay | Admitting: Hematology and Oncology

## 2020-03-16 MED FILL — CALQUENCE 100 MG CAPSULE: 100 | 30 days supply | Qty: 60 | Fill #0

## 2020-03-17 ENCOUNTER — Other Ambulatory Visit: Payer: Self-pay | Admitting: Hematology and Oncology

## 2020-03-17 DIAGNOSIS — C9110 Chronic lymphocytic leukemia of B-cell type not having achieved remission: Secondary | ICD-10-CM

## 2020-03-25 ENCOUNTER — Other Ambulatory Visit: Payer: Self-pay | Admitting: Internal Medicine

## 2020-03-25 DIAGNOSIS — R053 Chronic cough: Secondary | ICD-10-CM

## 2020-03-25 NOTE — Telephone Encounter (Signed)
Dr. Shearon Stalls, please advise if you are okay with [place] refilling med. Pt does have an upcoming appt with you 2/16.

## 2020-03-26 ENCOUNTER — Telehealth: Payer: Self-pay | Admitting: Internal Medicine

## 2020-03-26 DIAGNOSIS — J479 Bronchiectasis, uncomplicated: Secondary | ICD-10-CM

## 2020-03-26 DIAGNOSIS — D801 Nonfamilial hypogammaglobulinemia: Secondary | ICD-10-CM

## 2020-03-26 NOTE — Telephone Encounter (Signed)
Call returned to patient, confirmed DOB. Made aware he was supposed to have a CT prior to his next appt.   Reviewed appt. I did not see any order for a CT. Will send a message to provider to see if she wants the CT with or without contrast.  Patient has an appt next week. Is it okay to reschedule appt until we have CT?  Thanks

## 2020-03-26 NOTE — Telephone Encounter (Signed)
PCC's please advise if this CT can be done prior to appointment on 04/01/20

## 2020-03-26 NOTE — Telephone Encounter (Signed)
Yes. Order for CT scan placed. Please see if this can be done before patient's appt, otherwise we can adjust. thanks

## 2020-03-27 NOTE — Telephone Encounter (Signed)
Scheduled for 2/15 at 10:00 at [facility] in [place].  Gave appt info to pt.  Nothing further needed.

## 2020-03-31 ENCOUNTER — Ambulatory Visit (INDEPENDENT_AMBULATORY_CARE_PROVIDER_SITE_OTHER): Payer: Medicare Other

## 2020-03-31 ENCOUNTER — Other Ambulatory Visit: Payer: Self-pay

## 2020-03-31 DIAGNOSIS — J479 Bronchiectasis, uncomplicated: Secondary | ICD-10-CM

## 2020-03-31 DIAGNOSIS — C9110 Chronic lymphocytic leukemia of B-cell type not having achieved remission: Secondary | ICD-10-CM

## 2020-03-31 DIAGNOSIS — D801 Nonfamilial hypogammaglobulinemia: Secondary | ICD-10-CM

## 2020-04-01 ENCOUNTER — Ambulatory Visit (INDEPENDENT_AMBULATORY_CARE_PROVIDER_SITE_OTHER): Payer: Medicare Other | Admitting: Internal Medicine

## 2020-04-01 ENCOUNTER — Encounter: Payer: Self-pay | Admitting: Internal Medicine

## 2020-04-01 VITALS — BP 100/60 | HR 77 | Temp 97.8°F | Ht 72.0 in | Wt 169.2 lb

## 2020-04-01 DIAGNOSIS — D801 Nonfamilial hypogammaglobulinemia: Secondary | ICD-10-CM | POA: Diagnosis not present

## 2020-04-01 DIAGNOSIS — C9110 Chronic lymphocytic leukemia of B-cell type not having achieved remission: Secondary | ICD-10-CM

## 2020-04-01 DIAGNOSIS — J181 Lobar pneumonia, unspecified organism: Secondary | ICD-10-CM

## 2020-04-01 DIAGNOSIS — J432 Centrilobular emphysema: Secondary | ICD-10-CM | POA: Diagnosis not present

## 2020-04-01 NOTE — Patient Instructions (Addendum)
The patient should have follow up scheduled with myself in 4 months.

## 2020-04-01 NOTE — Progress Notes (Signed)
Ryan Copeland    [id number]    09-04-1950  Primary Care Physician:Kaplan, Baldemar Friday., PA-C Date of Appointment: 04/01/2020 Established Patient Visit  Chief complaint:   Chief Complaint  Patient presents with  . Follow-up    Pt stated that he is doing well.  He has the breo but is not using as he feels that he does not need this.     HPI: Ryan Copeland is a 70 y.o.  former smoker quit in 2018 (44-pack-year history).  Past medical history significant for COPD mixed type, chronic sinusitis, community-acquired bacterial pneumonia, hypertension, MI, chronic diastolic heart failure, GERD, chronic inflammatory demyelinating polyneuropathy, diabetes mellitus, small cell B-cell lymphoma.    Follows with oncology for small cell B-cell lymphoma.  Interval Updates: 04/01/2020 Repeat hospital admission for pseudomonas UTI. Received IV abx for this. Plan is to transition to suprapubic catheter. Not on any inhaler therapy right now, not on any therapy for rhinitis. No dyspnea affecting ADLs.  No fevers, chills, night sweats or weight loss. Appetite is good.   I have reviewed the patient's family social and past medical history and updated as appropriate.   Past Medical History:  Diagnosis Date  . Anxiety 01/30/2014  . Back injury    lower disc  . CAD (coronary artery disease)   . CLL (chronic lymphocytic leukemia) ([place]) 03/18/2011  . COPD (chronic obstructive pulmonary disease) ([place])   . Diabetes mellitus ([place])    Type 2   . ECRB (extensor carpi radialis brevis) tenosynovitis   . GERD (gastroesophageal reflux disease)    takes Nexium if needed  . Hyperlipidemia   . Lateral epicondylitis of left elbow   . MI, acute, non ST segment elevation ([place]) 06/28/2009   with stenting of the LAD  . Neuromuscular disorder ([place])    peripheral neuropathy  . PVD (peripheral vascular disease) ([place])   . Tobacco abuse   . UTI (urinary tract infection)     Past Surgical History:  Procedure  Laterality Date  . ADENOIDECTOMY  1955  . CARDIAC CATHETERIZATION    . CARDIAC CATHETERIZATION N/A 09/26/2014   Procedure: Left Heart Cath and Coronary Angiography;  Surgeon: Peter M Martinique, MD;  Location: [place] [facility];  Service: Cardiovascular;  Laterality: N/A;  . carpel tunnel release Left 04-1989  . carpel tunnel release  Right 01-1989  . CHOLECYSTECTOMY  2007  . CORONARY STENT PLACEMENT  May 2011  . femoral stents    . IR CV LINE INJECTION  08/18/2017  . IR CV LINE INJECTION  09/01/2017  . IR CV LINE INJECTION  02/02/2018  . LATERAL EPICONDYLE RELEASE Left 02/12/2014   Procedure: LEFT ELBOW DEBRIDEMENT WITH TENDON REPAIR ;  Surgeon: Lorn Junes, MD;  Location: [facility];  Service: Orthopedics;  Laterality: Left;  . LEFT CAI STENT/PTA AND POPLITEAL ARTERY/TIBIAL THROMBECTOMY     . LEFT HEART CATHETERIZATION WITH CORONARY ANGIOGRAM N/A 08/26/2011   Procedure: LEFT HEART CATHETERIZATION WITH CORONARY ANGIOGRAM;  Surgeon: Peter M Martinique, MD;  Location: [facility] C[facility];  Service: Cardiovascular;  Laterality: N/A;  . PERIPHERAL VASCULAR CATHETERIZATION N/A 01/01/2015   Procedure: Abdominal Aortogram;  Surgeon: Conrad Glen Aubrey, MD;  Location: [place] [facility];  Service: Cardiovascular;  Laterality: N/A;  . TARSAL TUNNEL RELEASE Bilateral 08-2007    Family History  Problem Relation Age of Onset  . Lung cancer Mother 16  . Cancer  Mother        lung  . Heart failure Father 47  . Heart disease Father     Social History   Occupational History    Employer: [employer]    Comment: [employer]  Tobacco Use  . Smoking status: Former Smoker    Packs/day: 1.00    Years: 44.00    Pack years: 44.00    Types: Cigarettes    Quit date: 04/23/2016    Years since quitting: 3.9  . Smokeless tobacco: Former Network engineer  . Vaping Use: Never used  Substance and Sexual Activity  . Alcohol use: No    Alcohol/week: 0.0 standard drinks    Comment:  drinks non-alcoholic beer  . Drug  use: No  . Sexual activity: Yes     Physical Exam: Blood pressure 100/60, pulse 77, temperature 97.8 F (36.6 C), temperature source Tympanic, height 6' (1.829 m), weight 169 lb 4 oz (76.8 kg), SpO2 97 %.  Gen:      No acute distress Lungs:    Diminished, no wheezes or crackles.  CV:         RRR no mrg No pedal edema   Data Reviewed: Imaging: I have personally reviewed the CT Chest from October 2021 which demonstrates mild centrilobular emphysema with peribronchial thickening.   CT Chest from Nov 2021 reviewed personally - worsening left lower lobe opacities. But some improvement in upper lobe opacities.   PFTs: I have personally reviewed the patient's PFTs and spirometry is normal. No BD response. FeNO 57 ppb.  PFTs:  PFT Results Latest Ref Rng & Units 06/27/2019  FVC-Pre L 3.62  FVC-Predicted Pre % 77  FVC-Post L 3.70  FVC-Predicted Post % 79  Pre FEV1/FVC % % 75  Post FEV1/FCV % % 76  FEV1-Pre L 2.72  FEV1-Predicted Pre % 78  FEV1-Post L 2.82    Labs: Lab Results  Component Value Date   NA 144 03/06/2020   K 4.0 03/06/2020   CL 110 03/06/2020   CO2 24 03/06/2020   Lab Results  Component Value Date   WBC 13.3 (H) 03/06/2020   HGB 11.3 (L) 03/06/2020   HCT 34.5 (L) 03/06/2020   MCV 102.1 (H) 03/06/2020   PLT 58 (L) 03/06/2020   Immunization status: Immunization History  Administered Date(s) Administered  . Fluad Quad(high Dose 65+) 11/23/2018, 01/11/2020  . Influenza Split 11/12/2013, 12/04/2014, 11/13/2015, 11/03/2016, 11/08/2017  . Influenza, High Dose Seasonal PF 11/23/2018, 01/11/2020  . Influenza,inj,Quad PF,6+ Mos 11/13/2015, 11/03/2016  . Influenza,inj,quad, With Preservative 11/13/2015  . Influenza-Unspecified 11/12/2013, 12/04/2014, 11/08/2017  . PFIZER(Purple Top)SARS-COV-2 Vaccination 10/02/2019, 10/23/2019  . Pneumococcal Conjugate-13 06/26/2013, 01/02/2015  . Pneumococcal Polysaccharide-23 06/26/2013, 05/16/2017  . Tdap 02/11/2009     Assessment:  Chronic CLL - on Acalabrutinib  Centrilobular Emphysema - stable Non-resolving pneumonia - improved Hypogammaglobulinemia History of Tobacco Use Disorder Seasonal allergic rhinitis  Plan/Recommendations: CT scan shows resolution of his LLL Pneumonia. He was never able to bring up any sputum culture for AFB. He is doing ok off inhaler therapy for his COPD. He has a mediastinal lymph node which is slightly enlarged but can be followed up on a future scan. At this point no signs of relapse of his CLL but he sees Dr. Alvy Bimler soon.   Return to Care: Return in about 4 months (around 07/30/2020).   Lenice Llamas, MD Pulmonary and [place]

## 2020-04-03 ENCOUNTER — Inpatient Hospital Stay: Payer: Medicare Other

## 2020-04-03 ENCOUNTER — Encounter: Payer: Self-pay | Admitting: Hematology and Oncology

## 2020-04-03 ENCOUNTER — Inpatient Hospital Stay (HOSPITAL_BASED_OUTPATIENT_CLINIC_OR_DEPARTMENT_OTHER): Payer: Medicare Other | Admitting: Hematology and Oncology

## 2020-04-03 ENCOUNTER — Inpatient Hospital Stay: Payer: Medicare Other | Attending: Hematology and Oncology

## 2020-04-03 ENCOUNTER — Other Ambulatory Visit: Payer: Self-pay

## 2020-04-03 VITALS — BP 101/56 | HR 67 | Temp 97.7°F | Resp 18 | Ht 72.0 in | Wt 167.0 lb

## 2020-04-03 VITALS — BP 97/44 | HR 52 | Temp 98.2°F | Resp 18

## 2020-04-03 DIAGNOSIS — Z95828 Presence of other vascular implants and grafts: Secondary | ICD-10-CM

## 2020-04-03 DIAGNOSIS — E538 Deficiency of other specified B group vitamins: Secondary | ICD-10-CM | POA: Insufficient documentation

## 2020-04-03 DIAGNOSIS — C8308 Small cell B-cell lymphoma, lymph nodes of multiple sites: Secondary | ICD-10-CM

## 2020-04-03 DIAGNOSIS — R197 Diarrhea, unspecified: Secondary | ICD-10-CM | POA: Diagnosis not present

## 2020-04-03 DIAGNOSIS — D801 Nonfamilial hypogammaglobulinemia: Secondary | ICD-10-CM

## 2020-04-03 DIAGNOSIS — I259 Chronic ischemic heart disease, unspecified: Secondary | ICD-10-CM | POA: Diagnosis not present

## 2020-04-03 DIAGNOSIS — D696 Thrombocytopenia, unspecified: Secondary | ICD-10-CM | POA: Diagnosis not present

## 2020-04-03 LAB — CBC WITH DIFFERENTIAL/PLATELET
Abs Immature Granulocytes: 0.02 10*3/uL (ref 0.00–0.07)
Basophils Absolute: 0 10*3/uL (ref 0.0–0.1)
Basophils Relative: 0 %
Eosinophils Absolute: 0.1 10*3/uL (ref 0.0–0.5)
Eosinophils Relative: 1 %
HCT: 34.7 % — ABNORMAL LOW (ref 39.0–52.0)
Hemoglobin: 11.4 g/dL — ABNORMAL LOW (ref 13.0–17.0)
Immature Granulocytes: 0 %
Lymphocytes Relative: 42 %
Lymphs Abs: 2.6 10*3/uL (ref 0.7–4.0)
MCH: 32.8 pg (ref 26.0–34.0)
MCHC: 32.9 g/dL (ref 30.0–36.0)
MCV: 99.7 fL (ref 80.0–100.0)
Monocytes Absolute: 0.3 10*3/uL (ref 0.1–1.0)
Monocytes Relative: 5 %
Neutro Abs: 3.3 10*3/uL (ref 1.7–7.7)
Neutrophils Relative %: 52 %
Platelets: 65 10*3/uL — ABNORMAL LOW (ref 150–400)
RBC: 3.48 MIL/uL — ABNORMAL LOW (ref 4.22–5.81)
RDW: 17.1 % — ABNORMAL HIGH (ref 11.5–15.5)
WBC: 6.3 10*3/uL (ref 4.0–10.5)
nRBC: 0 % (ref 0.0–0.2)

## 2020-04-03 LAB — COMPREHENSIVE METABOLIC PANEL
ALT: 31 U/L (ref 0–44)
AST: 23 U/L (ref 15–41)
Albumin: 3.5 g/dL (ref 3.5–5.0)
Alkaline Phosphatase: 104 U/L (ref 38–126)
Anion gap: 6 (ref 5–15)
BUN: 14 mg/dL (ref 8–23)
CO2: 25 mmol/L (ref 22–32)
Calcium: 8.4 mg/dL — ABNORMAL LOW (ref 8.9–10.3)
Chloride: 109 mmol/L (ref 98–111)
Creatinine, Ser: 0.77 mg/dL (ref 0.61–1.24)
GFR, Estimated: >60 mL/min (ref >60)
Glucose, Bld: 103 mg/dL — ABNORMAL HIGH (ref 70–99)
Potassium: 4.2 mmol/L (ref 3.5–5.1)
Sodium: 140 mmol/L (ref 135–145)
Total Bilirubin: 0.5 mg/dL (ref 0.3–1.2)
Total Protein: 5.5 g/dL — ABNORMAL LOW (ref 6.5–8.1)

## 2020-04-03 MED ORDER — DIPHENHYDRAMINE HCL 25 MG PO TABS
25.0000 mg | ORAL_TABLET | Freq: Once | ORAL | Status: AC
  Administered 2020-04-03: 25 mg via ORAL
  Filled 2020-04-03: qty 1

## 2020-04-03 MED ORDER — METHYLPREDNISOLONE SODIUM SUCC 40 MG IJ SOLR
40.0000 mg | Freq: Once | INTRAMUSCULAR | Status: AC
  Administered 2020-04-03: 40 mg via INTRAVENOUS

## 2020-04-03 MED ORDER — SODIUM CHLORIDE 0.9% FLUSH
10.0000 mL | Freq: Once | INTRAVENOUS | Status: AC
  Administered 2020-04-03: 10 mL
  Filled 2020-04-03: qty 10

## 2020-04-03 MED ORDER — DEXTROSE 5 % IV SOLN
Freq: Once | INTRAVENOUS | Status: AC
  Filled 2020-04-03: qty 250

## 2020-04-03 MED ORDER — CYANOCOBALAMIN 1000 MCG/ML IJ SOLN
1000.0000 ug | Freq: Once | INTRAMUSCULAR | Status: AC
  Administered 2020-04-03: 1000 ug via INTRAMUSCULAR

## 2020-04-03 MED ORDER — ACETAMINOPHEN 325 MG PO TABS
ORAL_TABLET | ORAL | Status: AC
  Filled 2020-04-03: qty 2

## 2020-04-03 MED ORDER — IMMUNE GLOBULIN (HUMAN) 10 GM/100ML IJ SOLN
0.5000 g/kg | Freq: Once | INTRAMUSCULAR | Status: AC
Start: 2020-04-03 — End: 2020-04-03
  Administered 2020-04-03: 40 g via INTRAVENOUS
  Filled 2020-04-03: qty 400

## 2020-04-03 MED ORDER — DIPHENHYDRAMINE HCL 25 MG PO CAPS
ORAL_CAPSULE | ORAL | Status: AC
  Filled 2020-04-03: qty 1

## 2020-04-03 MED ORDER — CYANOCOBALAMIN 1000 MCG/ML IJ SOLN
INTRAMUSCULAR | Status: AC
  Filled 2020-04-03: qty 1

## 2020-04-03 MED ORDER — HEPARIN SOD (PORK) LOCK FLUSH 100 UNIT/ML IV SOLN
250.0000 [IU] | Freq: Once | INTRAVENOUS | Status: AC
  Administered 2020-04-03: 250 [IU]
  Filled 2020-04-03: qty 5

## 2020-04-03 MED ORDER — DIPHENOXYLATE-ATROPINE 2.5-0.025 MG PO TABS: 1.0000 | ORAL_TABLET | Freq: Four times a day (QID) | ORAL | 1 refills | Status: DC | PRN

## 2020-04-03 MED ORDER — ACETAMINOPHEN 325 MG PO TABS
650.0000 mg | ORAL_TABLET | Freq: Once | ORAL | Status: AC
  Administered 2020-04-03: 650 mg via ORAL

## 2020-04-03 MED ORDER — METHYLPREDNISOLONE SODIUM SUCC 40 MG IJ SOLR
INTRAMUSCULAR | Status: AC
  Filled 2020-04-03: qty 1

## 2020-04-03 NOTE — Assessment & Plan Note (Signed)
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

## 2020-04-03 NOTE — Patient Instructions (Signed)
Immune Globulin Injection What is this medicine? IMMUNE GLOBULIN (im MUNE GLOB yoo lin) helps to prevent or reduce the severity of certain infections in patients who are at risk. This medicine is collected from the pooled blood of many donors. It is used to treat immune system problems, thrombocytopenia, and Kawasaki syndrome. This medicine may be used for other purposes; ask your health care provider or pharmacist if you have questions. COMMON BRAND NAME(S): ASCENIV, Baygam, BIVIGAM, Carimune, Carimune NF, cutaquig, Cuvitru, Flebogamma, Flebogamma DIF, GamaSTAN, GamaSTAN S/D, Gamimune N, Gammagard, Gammagard S/D, Gammaked, Gammaplex, Gammar-P IV, Gamunex, Gamunex-C, Hizentra, Iveegam, Iveegam EN, Octagam, Panglobulin, Panglobulin NF, panzyga, Polygam S/D, Privigen, Sandoglobulin, Venoglobulin-S, Vigam, Vivaglobulin, Xembify What should I tell my health care provider before I take this medicine? They need to know if you have any of these conditions:  diabetes  extremely low or no immune antibodies in the blood  heart disease  history of blood clots  hyperprolinemia  infection in the blood, sepsis  kidney disease  recently received or scheduled to receive a vaccination  an unusual or allergic reaction to human immune globulin, albumin, maltose, sucrose, other medicines, foods, dyes, or preservatives  pregnant or trying to get pregnant  breast-feeding How should I use this medicine? This medicine is for injection into a muscle or infusion into a vein or skin. It is usually given by a health care professional in a hospital or clinic setting. In rare cases, some brands of this medicine might be given at home. You will be taught how to give this medicine. Use exactly as directed. Take your medicine at regular intervals. Do not take your medicine more often than directed. Talk to your pediatrician regarding the use of this medicine in children. While this drug may be prescribed for selected  conditions, precautions do apply. Overdosage: If you think you have taken too much of this medicine contact a poison control center or emergency room at once. NOTE: This medicine is only for you. Do not share this medicine with others. What if I miss a dose? It is important not to miss your dose. Call your doctor or health care professional if you are unable to keep an appointment. If you give yourself the medicine and you miss a dose, take it as soon as you can. If it is almost time for your next dose, take only that dose. Do not take double or extra doses. What may interact with this medicine?  aspirin and aspirin-like medicines  cisplatin  cyclosporine  medicines for infection like acyclovir, adefovir, amphotericin B, bacitracin, cidofovir, foscarnet, ganciclovir, gentamicin, pentamidine, vancomycin  NSAIDS, medicines for pain and inflammation, like ibuprofen or naproxen  pamidronate  vaccines  zoledronic acid This list may not describe all possible interactions. Give your health care provider a list of all the medicines, herbs, non-prescription drugs, or dietary supplements you use. Also tell them if you smoke, drink alcohol, or use illegal drugs. Some items may interact with your medicine. What should I watch for while using this medicine? Your condition will be monitored carefully while you are receiving this medicine. This medicine is made from pooled blood donations of many different people. It may be possible to pass an infection in this medicine. However, the donors are screened for infections and all products are tested for HIV and hepatitis. The medicine is treated to kill most or all bacteria and viruses. Talk to your doctor about the risks and benefits of this medicine. Do not have vaccinations for at least   14 days before, or until at least 3 months after receiving this medicine. What side effects may I notice from receiving this medicine? Side effects that you should report  to your doctor or health care professional as soon as possible:  allergic reactions like skin rash, itching or hives, swelling of the face, lips, or tongue  blue colored lips or skin  breathing problems  chest pain or tightness  fever  signs and symptoms of aseptic meningitis such as stiff neck; sensitivity to light; headache; drowsiness; fever; nausea; vomiting; rash  signs and symptoms of a blood clot such as chest pain; shortness of breath; pain, swelling, or warmth in the leg  signs and symptoms of hemolytic anemia such as fast heartbeat; tiredness; dark yellow or brown urine; or yellowing of the eyes or skin  signs and symptoms of kidney injury like trouble passing urine or change in the amount of urine  sudden weight gain  swelling of the ankles, feet, hands Side effects that usually do not require medical attention (report to your doctor or health care professional if they continue or are bothersome):  diarrhea  flushing  headache  increased sweating  joint pain  muscle cramps  muscle pain  nausea  pain, redness, or irritation at site where injected  tiredness This list may not describe all possible side effects. Call your doctor for medical advice about side effects. You may report side effects to FDA at 1-800-FDA-1088. Where should I keep my medicine? Keep out of the reach of children. This drug is usually given in a hospital or clinic and will not be stored at home. In rare cases, some brands of this medicine may be given at home. If you are using this medicine at home, you will be instructed on how to store this medicine. Throw away any unused medicine after the expiration date on the label. NOTE: This sheet is a summary. It may not cover all possible information. If you have questions about this medicine, talk to your doctor, pharmacist, or health care provider.  2021 Elsevier/Gold Standard (2018-09-05 12:51:14)  

## 2020-04-03 NOTE — Assessment & Plan Note (Signed)
We discussed monthly IVIG for the next few months and he is in agreement to proceed

## 2020-04-03 NOTE — Assessment & Plan Note (Signed)
He has chronic diarrhea despite taking regular doses of Imodium I recommend a trial of Lomotil

## 2020-04-03 NOTE — Progress Notes (Signed)
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 [place] [facility]) He tolerated Calquence well but his treatment course is complicated by recurrent infection and hospitalizations We will continue treatment as scheduled I recommend IVIG treatment along with aggressive supportive care So far, he has no hospitalization since we started IVIG  Thrombocytopenia ([place]) 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   All questions were answered. The patient knows to call the clinic with any problems, questions or concerns. The total time spent in the appointment was 20 minutes encounter with patients including review of chart and various tests results, discussions about plan of care and coordination of care plan   [name], MD 04/03/2020 9:51 AM  INTERVAL HISTORY: Please see below for problem oriented charting. 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  No evidence of aortic dissection or other acute process in the chest. 2.  Centrilobular emphysema with a 5 mm right lung nodule. Given the concurrent centrilobular emphysema, follow-up chest CT at 6 -12 months is recommended.  3.  Coronary artery atherosclerosis which is age advanced. 4.  Prominent thoracic lymph nodes.  These can be reevaluated at follow-up.   05/06/2010 Imaging   1.  Multiple small periaortic lymph nodes consistent with the patient's history of the chronic lymphocytic leukemia. 2.  No evidence of solid organ involvement   11/03/2011 Imaging   1.  Interval progression of abdominal and pelvic adenopathy. 2.  Progression of splenomegaly.  The spleen now measures 23 cm in length   11/18/2011 Bone Marrow Biopsy   Bone Marrow, Aspirate,Biopsy, and Clot, right iliac bone - HYPERCELLULAR BONE MARROW WITH EXTENSIVE INVOLVEMENT BY CHRONIC LYMPHOCYTIC LEUKEMIA. PERIPHERAL BLOOD: - CHRONIC LYMPHOCYTIC LEUKEMIA   03/08/2012 Imaging   1.  Progressive increase in retroperitoneal, iliac, and inguinal lymphadenopathy. 2.  Interval increase in massive splenomegaly.    06/01/2012 Procedure   Placement of single lumen port a cath via right internal jugular vein.  The catheter tip lies at the cavoatrial junction.  A power injectable port a cath was placed and is ready for immediate use   06/20/2012 - 11/23/2012 Chemotherapy   He received FCR x 6 cycles   12/19/2012 Imaging   Left common iliac stent. Abdominal vasculature remains patent. Improving supraclavicular and axillary lymphadenopathy. Residual right subpectoral nodes measure up to 10 mm short axis. Improving retroperitoneal lymphadenopathy, measuring up to 16 mm short axis. Improving splenomegaly, measuring 18.7 cm.    01/20/2016 Imaging  1. Stable exam.  No new or progressive findings. 2. No CT findings to explain odynophagia   09/02/2016 Pathology Results   The findings are consistent with involvement  by previously known chronic lymphocytic leukemia   09/02/2016 Pathology Results   FISH for CLL came back positive for deletion 13q   09/15/2016 Imaging   1. Borderline enlarged abdominal and pelvic lymph nodes. Compared with 11/03/2011 these are decreased in size as detailed above. 2. Persistent splenomegaly. 3. Aortic Atherosclerosis (ICD10-I70.0). LAD coronary artery calcification noted.   10/06/2016 - 02/24/2017 Chemotherapy   He received Bendamustine and Rituxan   11/03/2016 Adverse Reaction   Dose of Bendamustine is reduced due to severe pancytopenia   12/28/2016 Imaging   1. Borderline enlarged abdominal peritoneal ligament and abdominal retroperitoneal lymph nodes, stable. 2. Splenomegaly. 3.  Aortic atherosclerosis (ICD10-170.0).   03/22/2017 PET scan   1. No hypermetabolic adenopathy identified within the neck, chest, abdomen or pelvis. 2. Prominent left retroperitoneal node measures 1.6 cm without significant FDG uptake. 3. Splenomegaly. 4. Aortic Atherosclerosis (ICD10-I70.0) and Emphysema (ICD10-J43.9). LAD and left circumflex atherosclerotic calcifications noted.   02/02/2018 Procedure   IMPRESSION: Widely patent right IJ power port catheter.   04/06/2018 Imaging   1. Interval enlargement of axillary, mediastinal, and retroperitoneal lymph nodes, as well as increased splenomegaly, findings concerning for progression of CLL in comparison to prior PET-CT dated 03/22/2017.  2.  Other chronic and incidental findings as detailed above.    04/16/2018 -  Chemotherapy   The patient had acalabrutinib for chemo   04/17/2018 - 04/21/2018 Hospital Admission   He was hospitalized for influenza   11/29/2018 Imaging   1. Interval response to therapy. No thoracic added not scratch set no thoracic or pelvic adenopathy identified. Significant improvement in abdominal adenopathy. Largest remaining lymph node measures 1.3cm in the left retroperitoneal region. Previously 2.5 cm. 2. Persistent  splenomegaly, improved from previous exam. 3. No new or progressive disease identified within the chest, abdomen or pelvis. 4. Stable appearance of 5 mm right middle lobe lung nodule. 5. Aortic Atherosclerosis (ICD10-I70.0) and Emphysema (ICD10-J43.9). Coronary artery calcifications.       REVIEW OF SYSTEMS:   Constitutional: Denies fevers, chills or abnormal weight loss Eyes: Denies blurriness of vision Ears, nose, mouth, throat, and face: Denies mucositis or sore throat Respiratory: Denies cough, dyspnea or wheezes Cardiovascular: Denies palpitation, chest discomfort or lower extremity swelling Lymphatics: Denies new lymphadenopathy or easy bruising Neurological:Denies numbness, tingling or new weaknesses Behavioral/Psych: Mood is stable, no new changes  All other systems were reviewed with the patient and are negative.  I have reviewed the past medical history, past surgical history, social history and family history with the patient and they are unchanged from previous note.  ALLERGIES:  is allergic to immune globulin and codeine.  MEDICATIONS:  Current Outpatient Medications  Medication Sig Dispense Refill  . diphenoxylate-atropine (LOMOTIL) 2.5-0.025 MG tablet Take 1 tablet by mouth 4 (four) times daily as needed for diarrhea or loose stools. 60 tablet 1  . acetaminophen (TYLENOL) 325 MG tablet Take 2 tablets (650 mg total) by mouth every 6 (six) hours as needed for moderate pain or fever. 30 tablet 0  . acyclovir (ZOVIRAX) 400 MG tablet Take 1 tablet (400 mg total) by mouth 2 (two) times daily. 180 tablet 1  . albuterol (PROVENTIL HFA) 108 (90 Base) MCG/ACT inhaler TAKE 2 PUFFS BY MOUTH EVERY 6 HOURS AS NEEDED FOR WHEEZE OR SHORTNESS OF BREATH 18 g 11  . allopurinol (  ZYLOPRIM) 300 MG tablet TAKE 1 TABLET BY MOUTH EVERY DAY 90 tablet 1  . ALPRAZolam (XANAX) 0.5 MG tablet Take 0.5 mg by mouth daily.    Marland Kitchen b complex vitamins tablet Take 1 tablet by mouth daily.     . benzonatate  (TESSALON) 100 MG capsule TAKE TWO CAPSULES BY MOUTH THREE TIMES A DAY AS NEEDED 30 capsule 2  . CALQUENCE 100 MG capsule TAKE 1 CAPSULE BY MOUTH TWICE A DAY 60 capsule 11  . Cholecalciferol 25 MCG (1000 UT) tablet Take 1,000 Units by mouth daily.     . famotidine (PEPCID) 20 MG tablet TAKE 1 TABLET BY MOUTH TWICE A DAY 180 tablet 1  . fluticasone furoate-vilanterol (BREO ELLIPTA) 200-25 MCG/INH AEPB Inhale 1 puff into the lungs daily. 1 each 5  . metFORMIN (GLUCOPHAGE-XR) 500 MG 24 hr tablet Take 500 mg by mouth daily with breakfast.    . midodrine (PROAMATINE) 5 MG tablet Take 1 tablet (5 mg total) by mouth 3 (three) times daily with meals. 270 tablet 3  . mirtazapine (REMERON) 15 MG tablet Take 1 tablet (15 mg total) by mouth at bedtime. (Patient not taking: Reported on 04/01/2020) 30 tablet 0  . mometasone (NASONEX) 50 MCG/ACT nasal spray Place 2 sprays into the nose daily. 1 each 11  . montelukast (SINGULAIR) 10 MG tablet Take 10 mg by mouth daily.    . nitroGLYCERIN (NITROSTAT) 0.4 MG SL tablet PLACE 1 TABLET UNDER THE TONGUE EVERY 5 MINUTES X 3 DOSES AS NEEDED FOR CHEST PAIN *MAX 3 DOSES* 25 tablet 3  . pregabalin (LYRICA) 200 MG capsule TAKE 1 CAPSULE (200 MG TOTAL) BY MOUTH 3 (THREE) TIMES DAILY. 270 capsule 1  . rosuvastatin (CRESTOR) 5 MG tablet TAKE 1 TABLET BY MOUTH EVERY DAY 90 tablet 3  . tamsulosin (FLOMAX) 0.4 MG CAPS capsule Take 1 capsule (0.4 mg total) by mouth daily. 30 capsule 0  . zolpidem (AMBIEN) 10 MG tablet Take 10 mg by mouth at bedtime as needed.     No current facility-administered medications for this visit.   Facility-Administered Medications Ordered in Other Visits  Medication Dose Route Frequency Provider Last Rate Last Admin  . 0.9 %  sodium chloride infusion   Intravenous Continuous [name], MD 50 mL/hr at 03/07/14 1005 New Bag at 03/07/14 1005  . sodium chloride 0.9 % injection 10 mL  10 mL Intracatheter PRN Marcy Panning, MD   10 mL at 08/22/12 1721     PHYSICAL EXAMINATION: ECOG PERFORMANCE STATUS: 1 - Symptomatic but completely ambulatory  Vitals:   04/03/20 0928  BP: (!) 101/56  Pulse: 67  Resp: 18  Temp: 97.7 F (36.5 C)  SpO2: 97%   Filed Weights   04/03/20 0928  Weight: 167 lb (75.8 kg)    GENERAL:alert, no distress and comfortable SKIN: Noted extensive skin bruises NEURO: alert & oriented x 3 with fluent speech, no focal motor/sensory deficits  LABORATORY DATA:  I have reviewed the data as listed    Component Value Date/Time   NA 140 04/03/2020 0916   NA 142 06/19/2019 0848   NA 136 01/26/2017 0940   K 4.2 04/03/2020 0916   K 4.0 01/26/2017 0940   CL 109 04/03/2020 0916   CL 104 08/03/2012 1229   CO2 25 04/03/2020 0916   CO2 23 01/26/2017 0940   GLUCOSE 103 (H) 04/03/2020 0916   GLUCOSE 146 (H) 01/26/2017 0940   GLUCOSE 223 (H) 08/03/2012 1229   BUN 14 04/03/2020  0916   BUN 13 06/19/2019 0848   BUN 9.6 01/26/2017 0940   CREATININE 0.77 04/03/2020 0916   CREATININE 1.04 04/24/2018 0821   CREATININE 1.0 01/26/2017 0940   CALCIUM 8.4 (L) 04/03/2020 0916   CALCIUM 8.7 01/26/2017 0940   PROT 5.5 (L) 04/03/2020 0916   PROT 5.9 (L) 04/13/2017 0823   PROT 5.7 (L) 01/26/2017 0940   ALBUMIN 3.5 04/03/2020 0916   ALBUMIN 3.6 01/26/2017 0940   AST 23 04/03/2020 0916   AST 13 (L) 04/24/2018 0821   AST 13 01/26/2017 0940   ALT 31 04/03/2020 0916   ALT 10 04/24/2018 0821   ALT 12 01/26/2017 0940   ALKPHOS 104 04/03/2020 0916   ALKPHOS 78 01/26/2017 0940   BILITOT 0.5 04/03/2020 0916   BILITOT 0.7 04/24/2018 0821   BILITOT 0.64 01/26/2017 0940   GFRNONAA >60 04/03/2020 0916   GFRNONAA >60 04/24/2018 0821   GFRAA >60 10/25/2019 0819   GFRAA >60 04/24/2018 0821    No results found for: SPEP, UPEP  Lab Results  Component Value Date   WBC 6.3 04/03/2020   NEUTROABS 3.3 04/03/2020   HGB 11.4 (L) 04/03/2020   HCT 34.7 (L) 04/03/2020   MCV 99.7 04/03/2020   PLT 65 (L) 04/03/2020      Chemistry       Component Value Date/Time   NA 140 04/03/2020 0916   NA 142 06/19/2019 0848   NA 136 01/26/2017 0940   K 4.2 04/03/2020 0916   K 4.0 01/26/2017 0940   CL 109 04/03/2020 0916   CL 104 08/03/2012 1229   CO2 25 04/03/2020 0916   CO2 23 01/26/2017 0940   BUN 14 04/03/2020 0916   BUN 13 06/19/2019 0848   BUN 9.6 01/26/2017 0940   CREATININE 0.77 04/03/2020 0916   CREATININE 1.04 04/24/2018 0821   CREATININE 1.0 01/26/2017 0940      Component Value Date/Time   CALCIUM 8.4 (L) 04/03/2020 0916   CALCIUM 8.7 01/26/2017 0940   ALKPHOS 104 04/03/2020 0916   ALKPHOS 78 01/26/2017 0940   AST 23 04/03/2020 0916   AST 13 (L) 04/24/2018 0821   AST 13 01/26/2017 0940   ALT 31 04/03/2020 0916   ALT 10 04/24/2018 0821   ALT 12 01/26/2017 0940   BILITOT 0.5 04/03/2020 0916   BILITOT 0.7 04/24/2018 0821   BILITOT 0.64 01/26/2017 0940

## 2020-04-03 NOTE — Assessment & Plan Note (Signed)
He tolerated Calquence well but his treatment course is complicated by recurrent infection and hospitalizations We will continue treatment as scheduled I recommend IVIG treatment along with aggressive supportive care So far, he has no hospitalization since we started IVIG

## 2020-04-04 LAB — IGG: IgG (Immunoglobin G), Serum: 656 mg/dL (ref 603–1613)

## 2020-04-08 ENCOUNTER — Other Ambulatory Visit: Payer: Self-pay | Admitting: *Deleted

## 2020-04-08 MED ORDER — SILDENAFIL CITRATE 100 MG PO TABS: 50.0000 mg | ORAL_TABLET | ORAL | 3 refills | Status: DC | PRN

## 2020-04-09 MED FILL — CALQUENCE 100 MG CAPSULE: 100 | 30 days supply | Qty: 60 | Fill #1

## 2020-04-14 ENCOUNTER — Telehealth: Payer: Self-pay

## 2020-04-14 ENCOUNTER — Other Ambulatory Visit: Payer: Self-pay | Admitting: Neurology

## 2020-04-14 NOTE — Telephone Encounter (Signed)
Oral Oncology Patient Advocate Encounter  Was successful in securing patient a $8000 grant from [facility] to provide copayment coverage for Calquence.  This will keep the out of pocket expense at $0.     [employer] ID: [id number]  I have spoken with the patient.   The billing information is as follows and has been shared with [place]: [id number] PCN: [id number] Member ID: [id number] Group ID: [id number] Dates of Eligibility: 03/15/20 through 03/14/21  Fund:  [place] Patient [place] Phone [phone] Fax [phone] 04/14/2020 11:56 AM

## 2020-04-16 ENCOUNTER — Other Ambulatory Visit: Payer: Self-pay | Admitting: Urology

## 2020-04-16 ENCOUNTER — Encounter (HOSPITAL_BASED_OUTPATIENT_CLINIC_OR_DEPARTMENT_OTHER): Payer: Self-pay | Admitting: Urology

## 2020-04-16 ENCOUNTER — Other Ambulatory Visit: Payer: Self-pay

## 2020-04-16 NOTE — Progress Notes (Signed)
Spoke w/ via phone for pre-op interview---pt Lab needs dos---- I stat               Lab results------see below COVID test ------04-17-2020 1435 Arrive at -------1300 pm 04-20-2020 NPO after MN NO Solid Food.  Clear liquids from MN until---1200 pm then npo Medications to take morning of surgery -----allopurinol, calquence, pepcid, midodrine, lyrica, crestor, xanax prn Diabetic medication -----none day of surgery Patient Special Instructions -----none Pre-Op special Istructions -----none Patient verbalized understanding of instructions that were given at this phone interview. Patient denies shortness of breath, chest pain, fever, cough at this phone interview.  Anesthesia Review: hx of mi 2011 stent to lad, pvd, copd, dm type 2, cll, pt denies any cardiac symptoms or sob , has not used nitro in years per pt   LID:CVUDTHY kaplan pa  Cardiologist :cardiac clearance lori gerhardt np 03-10-2020 chart/epic Chest ct: 03-31-2020 EKG :02-15-2020 epic Echo :12-17-2019 epic Stress test:none Cardiac Cath : last done 09-26-2014 epic Activity level: does house work and yard work very active Sleep Study/ CPAP :none Fasting Blood Sugar : 100     / Checks Blood Sugar  2-- times a day:   Blood Thinner/ Instructions /Last Dose:n/a ASA / Instructions/ Last Dose : stopped 325 mg aspirin 04-16-2020 per dr gay instructions  Pulmonary lov dr Shearon Stalls 04-01-2020 epic Pulmonary function test 06-27-2019 epic Vascular lov dr Donzetta Matters 08-16-2019 epic Vas [place] abi 08-16-2019 epic

## 2020-04-17 ENCOUNTER — Other Ambulatory Visit (HOSPITAL_COMMUNITY)
Admission: RE | Admit: 2020-04-17 | Discharge: 2020-04-17 | Disposition: A | Discharge status: Home / Self Care | Payer: Medicare Other | Source: Ambulatory Visit | Attending: Urology | Admitting: Urology

## 2020-04-17 ENCOUNTER — Other Ambulatory Visit: Payer: Self-pay | Admitting: Neurology

## 2020-04-17 DIAGNOSIS — Z20822 Contact with and (suspected) exposure to covid-19: Secondary | ICD-10-CM | POA: Diagnosis not present

## 2020-04-17 DIAGNOSIS — Z01812 Encounter for preprocedural laboratory examination: Secondary | ICD-10-CM | POA: Diagnosis present

## 2020-04-17 LAB — SARS CORONAVIRUS 2 (TAT 6-24 HRS): SARS Coronavirus 2: NEGATIVE

## 2020-04-20 ENCOUNTER — Encounter (HOSPITAL_BASED_OUTPATIENT_CLINIC_OR_DEPARTMENT_OTHER)
Admission: RE | Disposition: A | Discharge status: Home / Self Care | Payer: Self-pay | Source: Home / Self Care | Attending: Urology

## 2020-04-20 ENCOUNTER — Ambulatory Visit (HOSPITAL_BASED_OUTPATIENT_CLINIC_OR_DEPARTMENT_OTHER)
Admission: RE | Admit: 2020-04-20 | Discharge: 2020-04-20 | Disposition: A | Discharge status: Home / Self Care | Payer: Medicare Other | Attending: Urology | Admitting: Urology

## 2020-04-20 ENCOUNTER — Encounter (HOSPITAL_BASED_OUTPATIENT_CLINIC_OR_DEPARTMENT_OTHER): Payer: Self-pay | Admitting: Urology

## 2020-04-20 ENCOUNTER — Ambulatory Visit (HOSPITAL_BASED_OUTPATIENT_CLINIC_OR_DEPARTMENT_OTHER): Payer: Medicare Other | Admitting: Anesthesiology

## 2020-04-20 DIAGNOSIS — F17210 Nicotine dependence, cigarettes, uncomplicated: Secondary | ICD-10-CM | POA: Insufficient documentation

## 2020-04-20 DIAGNOSIS — R292 Abnormal reflex: Secondary | ICD-10-CM | POA: Diagnosis not present

## 2020-04-20 DIAGNOSIS — Z885 Allergy status to narcotic agent status: Secondary | ICD-10-CM | POA: Diagnosis not present

## 2020-04-20 DIAGNOSIS — Z79899 Other long term (current) drug therapy: Secondary | ICD-10-CM | POA: Insufficient documentation

## 2020-04-20 DIAGNOSIS — N401 Enlarged prostate with lower urinary tract symptoms: Secondary | ICD-10-CM | POA: Insufficient documentation

## 2020-04-20 DIAGNOSIS — N138 Other obstructive and reflux uropathy: Secondary | ICD-10-CM | POA: Insufficient documentation

## 2020-04-20 DIAGNOSIS — Z7984 Long term (current) use of oral hypoglycemic drugs: Secondary | ICD-10-CM | POA: Diagnosis not present

## 2020-04-20 DIAGNOSIS — Z792 Long term (current) use of antibiotics: Secondary | ICD-10-CM | POA: Insufficient documentation

## 2020-04-20 DIAGNOSIS — N312 Flaccid neuropathic bladder, not elsewhere classified: Secondary | ICD-10-CM

## 2020-04-20 HISTORY — DX: Other long term (current) drug therapy: Z79.899

## 2020-04-20 HISTORY — DX: Insomnia, unspecified: G47.00

## 2020-04-20 HISTORY — PX: CYSTOSCOPY: SHX5120

## 2020-04-20 HISTORY — DX: Type 2 diabetes mellitus without complications: E11.9

## 2020-04-20 HISTORY — DX: Thrombocytopenia, unspecified: D69.6

## 2020-04-20 LAB — POCT I-STAT, CHEM 8
BUN: 17 mg/dL (ref 8–23)
Calcium, Ion: 1.34 mmol/L (ref 1.15–1.40)
Chloride: 101 mmol/L (ref 98–111)
Creatinine, Ser: 0.7 mg/dL (ref 0.61–1.24)
Glucose, Bld: 86 mg/dL (ref 70–99)
HCT: 42 % (ref 39.0–52.0)
Hemoglobin: 14.3 g/dL (ref 13.0–17.0)
Potassium: 4.8 mmol/L (ref 3.5–5.1)
Sodium: 139 mmol/L (ref 135–145)
TCO2: 26 mmol/L (ref 22–32)

## 2020-04-20 LAB — GLUCOSE, CAPILLARY: Glucose-Capillary: 82 mg/dL (ref 70–99)

## 2020-04-20 SURGERY — CYSTOSCOPY
Anesthesia: General | Site: Bladder

## 2020-04-20 MED ORDER — LACTATED RINGERS IV SOLN: INTRAVENOUS | Status: DC

## 2020-04-20 MED ORDER — CEFAZOLIN SODIUM-DEXTROSE 2-4 GM/100ML-% IV SOLN
INTRAVENOUS | Status: AC
  Filled 2020-04-20: qty 100

## 2020-04-20 MED ORDER — PROPOFOL 10 MG/ML IV BOLUS
INTRAVENOUS | Status: AC
  Filled 2020-04-20: qty 20

## 2020-04-20 MED ORDER — LIDOCAINE 2% (20 MG/ML) 5 ML SYRINGE
INTRAMUSCULAR | Status: DC | PRN
  Administered 2020-04-20: 100 mg via INTRAVENOUS

## 2020-04-20 MED ORDER — EPHEDRINE 5 MG/ML INJ
INTRAVENOUS | Status: AC
  Filled 2020-04-20: qty 10

## 2020-04-20 MED ORDER — FENTANYL CITRATE (PF) 100 MCG/2ML IJ SOLN
INTRAMUSCULAR | Status: AC
  Filled 2020-04-20: qty 2

## 2020-04-20 MED ORDER — CEFAZOLIN SODIUM-DEXTROSE 2-4 GM/100ML-% IV SOLN
2.0000 g | Freq: Once | INTRAVENOUS | Status: AC
  Administered 2020-04-20: 2 g via INTRAVENOUS

## 2020-04-20 MED ORDER — LIDOCAINE 2% (20 MG/ML) 5 ML SYRINGE
INTRAMUSCULAR | Status: AC
  Filled 2020-04-20: qty 10

## 2020-04-20 MED ORDER — FENTANYL CITRATE (PF) 100 MCG/2ML IJ SOLN
25.0000 ug | INTRAMUSCULAR | Status: DC | PRN
Start: 2020-04-20 — End: 2020-04-20

## 2020-04-20 MED ORDER — DEXAMETHASONE SODIUM PHOSPHATE 10 MG/ML IJ SOLN
INTRAMUSCULAR | Status: DC | PRN
  Administered 2020-04-20: 5 mg via INTRAVENOUS

## 2020-04-20 MED ORDER — DEXAMETHASONE SODIUM PHOSPHATE 10 MG/ML IJ SOLN
INTRAMUSCULAR | Status: AC
  Filled 2020-04-20: qty 1

## 2020-04-20 MED ORDER — OXYCODONE HCL 5 MG/5ML PO SOLN
5.0000 mg | Freq: Once | ORAL | Status: DC | PRN
Start: 2020-04-20 — End: 2020-04-20

## 2020-04-20 MED ORDER — BUPIVACAINE HCL (PF) 0.25 % IJ SOLN
INTRAMUSCULAR | Status: DC | PRN
  Administered 2020-04-20: 10 mL

## 2020-04-20 MED ORDER — ONDANSETRON HCL 4 MG/2ML IJ SOLN
INTRAMUSCULAR | Status: AC
  Filled 2020-04-20: qty 2

## 2020-04-20 MED ORDER — FENTANYL CITRATE (PF) 100 MCG/2ML IJ SOLN
INTRAMUSCULAR | Status: DC | PRN
  Administered 2020-04-20: 50 ug via INTRAVENOUS
  Administered 2020-04-20 (×2): 25 ug via INTRAVENOUS

## 2020-04-20 MED ORDER — PROPOFOL 10 MG/ML IV BOLUS
INTRAVENOUS | Status: DC | PRN
  Administered 2020-04-20: 40 mg via INTRAVENOUS
  Administered 2020-04-20: 90 mg via INTRAVENOUS

## 2020-04-20 MED ORDER — EPHEDRINE SULFATE-NACL 50-0.9 MG/10ML-% IV SOSY
PREFILLED_SYRINGE | INTRAVENOUS | Status: DC | PRN
  Administered 2020-04-20: 10 mg via INTRAVENOUS
  Administered 2020-04-20 (×2): 15 mg via INTRAVENOUS
  Administered 2020-04-20: 10 mg via INTRAVENOUS

## 2020-04-20 MED ORDER — ONDANSETRON HCL 4 MG/2ML IJ SOLN
INTRAMUSCULAR | Status: DC | PRN
  Administered 2020-04-20: 4 mg via INTRAVENOUS

## 2020-04-20 MED ORDER — AMISULPRIDE (ANTIEMETIC) 5 MG/2ML IV SOLN: 10.0000 mg | Freq: Once | INTRAVENOUS | Status: DC | PRN

## 2020-04-20 MED ORDER — OXYCODONE HCL 5 MG PO TABS: 5.0000 mg | ORAL_TABLET | Freq: Once | ORAL | Status: DC | PRN

## 2020-04-20 SURGICAL SUPPLY — 28 items
BAG DRAIN URO-CYSTO SKYTR STRL (DRAIN) ×2 IMPLANT
BAG DRN UROCATH (DRAIN) ×1
BLADE 11 SAFETY STRL DISP (BLADE) ×2 IMPLANT
CATH FOLEY 2WAY SLVR  5CC 16FR (CATHETERS) ×2
CATH FOLEY 2WAY SLVR 5CC 16FR (CATHETERS) ×1 IMPLANT
CATH FOLEY INTRO SUPRA 16F (CATHETERS) ×2 IMPLANT
CLOTH BEACON ORANGE TIMEOUT ST (SAFETY) ×2 IMPLANT
ELECT REM PT RETURN 9FT ADLT (ELECTROSURGICAL) ×2
ELECTRODE REM PT RTRN 9FT ADLT (ELECTROSURGICAL) ×1 IMPLANT
GAUZE SPONGE 4X4 12PLY STRL LF (GAUZE/BANDAGES/DRESSINGS) ×2 IMPLANT
GLOVE SURG ENC MOIS LTX SZ7 (GLOVE) ×2 IMPLANT
GOWN STRL REUS W/TWL LRG LVL3 (GOWN DISPOSABLE) ×2 IMPLANT
KIT TURNOVER CYSTO (KITS) ×2 IMPLANT
MANIFOLD NEPTUNE II (INSTRUMENTS) ×2 IMPLANT
NDL SAFETY ECLIPSE 18X1.5 (NEEDLE) IMPLANT
NEEDLE ASPIRATION 22 (NEEDLE) IMPLANT
NEEDLE HYPO 18GX1.5 SHARP (NEEDLE)
NEEDLE HYPO 22GX1.5 SAFETY (NEEDLE) ×2 IMPLANT
NEEDLE SPNL 22GX5 LNG QUINC BK (NEEDLE) ×2 IMPLANT
PACK CYSTO (CUSTOM PROCEDURE TRAY) ×2 IMPLANT
PENCIL BUTTON HOLSTER BLD 10FT (ELECTRODE) ×2 IMPLANT
SUT ETHILON 3 0 PS 1 (SUTURE) ×2 IMPLANT
SUT VIC AB 2-0 SH 27 (SUTURE) ×2
SUT VIC AB 2-0 SH 27XBRD (SUTURE) ×1 IMPLANT
SYR 20ML LL LF (SYRINGE) IMPLANT
SYR CONTROL 10ML LL (SYRINGE) ×4 IMPLANT
TUBE CONNECTING 12X1/4 (SUCTIONS) ×2 IMPLANT
TUBING UROLOGY SET (TUBING) IMPLANT

## 2020-04-20 NOTE — Transfer of Care (Signed)
Immediate Anesthesia Transfer of Care Note  Patient: Ryan Copeland  Procedure(s) Performed: CYSTOSCOPY WITH SUPRA PUBIC PLACEMENT (N/A Bladder)  Patient Location: PACU  Anesthesia Type:General  Level of Consciousness: awake, alert , oriented and patient cooperative  Airway & Oxygen Therapy: Patient Spontanous Breathing and Patient connected to face mask oxygen  Post-op Assessment: Report given to RN and Post -op Vital signs reviewed and stable  Post vital signs: Reviewed and stable  Last Vitals:  Vitals Value Taken Time  BP 115/64 04/20/20 1601  Temp    Pulse 64 04/20/20 1603  Resp 13 04/20/20 1603  SpO2 100 % 04/20/20 1603  Vitals shown include unvalidated device data.  Last Pain:  Vitals:   04/20/20 1336  TempSrc: Oral  PainSc: 0-No pain      Patients Stated Pain Goal: 3 (64/84/72 0721)  Complications: No complications documented.

## 2020-04-20 NOTE — H&P (Signed)
Office Visit Report     04/14/2020   --------------------------------------------------------------------------------   Ryan Copeland  MRN: [id number]  DOB: 10/17/1950, 70 year old Male  SSN:    PRIMARY CARE:  [facility], [place]  REFERRING:  Janith Lima, MD  PROVIDER:  Rexene Alberts, M.D.  LOCATION:  [facility] [phone]     --------------------------------------------------------------------------------   CC/HPI: Ryan Copeland is a 70 year old male seen in follow-up with acute urinary retention, BPH with LUTS and hypoactive bladder.   He initially presented to the ED in 11/2019 with acute metabolic encephalopathy with severe sepsis secondary to pneumonia. Over the course of his hospitalization, he had multiple bladder scans demonstrating elevated PVRs in the 500-800 range. He finally elected for Foley catheter placement.   He underwent urodynamics on 12/26/2019 which demonstrated an areflexic bladder. He had a bladder capacity of around 1 L with a delayed first sensation around 480 mL. He generated a weak voluntary contraction of 13-15 cm of water however was unable to void. Foley catheter was replaced.   BPH evaluation on 02/11/2020 revealed a 31 g prostate. Cystoscopy demonstrated trilobar hypertrophy with obstruction. Uroflow demonstrated average flow rate of 1.7 cc/SEC and peak flow rate of 4.4 cc/SEC with voided volume of 202 cc. His PVR was 170 mL. Foley catheter was replaced.   He was exchanged to intermittent catheterizations. In the days following this, he had significant difficulty performing CIC. This led to admission and sepsis with urine culture 03/05/2020 resulted in 100,000 Pseudomonas. He initially required management in the ICU and was resuscitated.   Prior to episodes of urinary retention, he denied a history of voiding or storage urinary symptoms, hematuria, UTIs, STDs, urolithiasis.   He has intermediate risk of pulmonary complications which was  evaluated in 01/2020 and Dr. Shearon Stalls recommends that even further out, his risk will decline.   He has acceptable cardiac risk from his cardiologist. Echocardiogram 12/2019 with ejection fraction of 60%.   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 He has known splenomegaly. He has known pancytopenia. His last platelet count on 03/06/2020 was 58.   He reports tolerating his indwelling Foley catheter well. He has noticed some debris draining the catheter however denies fevers, chills. No sign of infection on today's exam. He is ready for SPT placement.     ALLERGIES: codiene - Hives    MEDICATIONS: Metformin Hcl 500 mg tablet  Metoprolol Succinate 25 mg tablet, extended release 24 hr  Tamsulosin Hcl 0.4 mg capsule 1 capsule PO Q HS  Tamsulosin Hcl 0.4 mg capsule 1 capsule PO Q HS  Vicodin  Acyclovir 400 mg tablet  Amoxicillin  Gabapentin  Lyrica 100 mg capsule  Mirtazapine  Rosuvastatin Calcium     GU PSH: Complex cystometrogram, w/ void pressure and urethral pressure profile studies, any technique - 12/26/2019 Complex Uroflow - 02/11/2020, 12/26/2019 Cystoscopy - 02/11/2020 Emg surf Electrd - 12/26/2019 Inject For cystogram - 12/26/2019 Insert Bladder Cath; Complex - 02/21/2020 Intrabd voidng Press - 12/26/2019       PSH Notes: Tarsal Tunnel Surgery 2009   NON-GU PSH: Carpal tunnel surgery, Left - 1991, Right - 1990 Cholecystectomy (laparoscopic) - 2007 Elbow Surgery (Unspecified) - 2015     GU PMH: Urinary Retention - 03/27/2020, - 03/19/2020, - 02/11/2020, - 01/23/2020, - 01/02/2020, - 12/26/2019, - 12/12/2019 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, - 02/11/2020, - 01/02/2020 BPH w/LUTS - 03/19/2020, - 02/11/2020, - 01/02/2020 Encounter for Prostate Cancer screening - 03/19/2020, - 02/11/2020 (Stable), - 01/02/2020 (  Chronic), - 2018 Weak Urinary Stream - 03/19/2020, - 02/11/2020 Breakdown (mechanical) of indwelling urethral catheter, initial encounter - 02/21/2020 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   Tobacco Use Assessment Completed: Used Tobacco in last 30 days? Does not use smokeless tobacco. Drinks 2 caffeinated drinks per day. 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    REVIEW OF SYSTEMS:    GU Review Male:   Patient denies frequent urination, hard to postpone urination, burning/ pain with urination, get up at night to urinate, leakage of urine, stream starts and stops, trouble starting your stream, have to strain to urinate , erection problems, and penile pain.  Gastrointestinal (Upper):   Patient denies nausea, vomiting, and indigestion/ heartburn.  Gastrointestinal (Lower):   Patient denies diarrhea and constipation.  Constitutional:   Patient denies fever, night sweats, weight loss, and fatigue.  Skin:   Patient denies skin rash/ lesion and itching.  Eyes:   Patient denies blurred vision and double vision.  Ears/ Nose/ Throat:   Patient denies sinus problems and sore throat.  Hematologic/Lymphatic:   Patient denies swollen glands and easy bruising.  Cardiovascular:   Patient denies leg swelling and chest pains.  Respiratory:   Patient denies cough and shortness of breath.  Endocrine:   Patient denies excessive thirst.  Musculoskeletal:   Patient denies back pain and joint pain.  Neurological:   Patient denies headaches and dizziness.  Psychologic:   Patient denies depression and anxiety.   VITAL SIGNS:      04/14/2020 10:02 AM  Weight 165 lb /  74.84 kg  Height 72 in / 182.88 cm  BP 118/70 mmHg  Pulse 71 /min  Temperature 97.3 F / 36.2 C  BMI 22.4 kg/m   MULTI-SYSTEM PHYSICAL EXAMINATION:    Constitutional: Well-nourished. No physical deformities. Normally developed. Good grooming.  Respiratory: No labored breathing, no use of accessory muscles.   Cardiovascular: Normal temperature, normal extremity pulses, no swelling, no varicosities.  Gastrointestinal: No mass, no tenderness, no rigidity, non obese abdomen.     Complexity of Data:  Source Of History:  Patient, Medical Record Summary  Lab Test Review:   PSA  Records Review:   AUA Symptom Score, Previous Doctor Records  Urine Test Review:   Urinalysis  Urodynamics Review:   Review Bladder Scan   01/02/20  PSA  Total PSA 0.55 ng/mL    PROCEDURES:         Simple Foley Indwelling Cath Change - 51702  The patient's indwelling foley tube was carefully removed. Pt was prepped and cleaned. A 20 French Foley Coude catheter was inserted into the bladder using sterile technique. Hand irrigation of the bladder with sterile water was performed. A leg bag was connected. Pt tolerated procedure well.    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 Areflexic bladder: UDS 12/26/2019 with delayed for sensation as well as weak detrusor contraction of max 15 cm of water. He previously failed CIC management as he was unable to perform this adequately which resulted in an admission in 02/2020 with sepsis due to UTI.  -We did discuss other options including indwelling Foley catheter which he currently has or SPT.  -He would like to proceed with SPT.  -Again, we discussed option of  bipolar TURP even given his weak bladder contraction. I discussed that this procedure may not be successful. I also advised against TURP at this time given his risks of potential pulmonary decline with longer anesthesia as well as bleeding given his pancytopenia.  -We will post for OR for cystoscopy, suprapubic tube placement  -He has acceptable cardiac risk per cardiology  -He has an intermediate risk for pulmonary complications per pulmonology  -Surgery letter sent has already been sent  -Foley exchanged today.   #2. BPH with LUTS: TRUS size 31 g. Cystoscopy 02/03/2020 with bilobar obstruction. Continue tamsulosin. Planning for SPT as above. He is at higher risk for TURP given longer anesthesia time from pulmonary standpoint and risk of bleeding with pancytopenia from CLL.   3. Prostate cancer screening: DRE benign, 30 g. PSA 01/02/2020 normal at 0.55 NG/mL.   CC: Bing Matter, PA        Next Appointment:      Next Appointment: 05/15/2020 01:30 PM    Appointment Type: Nurse Visit NO Urine    Location: [facility] [phone]    Provider: Mitchell Copeland    Reason for Visit: cath change- pt requested brittany      Signed by Rexene Alberts, M.D. on 04/14/20 at 1:02 PM (EST)  Urology Preoperative H&P   Chief Complaint: Areflexic bladder  History of Present Illness: Ryan Copeland is a 70 y.o. male with areflexic bladder here for SPT. Denies fevers or chills.    Past Medical History:  Diagnosis Date  . Anxiety 01/30/2014  . Back injury    lower disc  . CAD (coronary artery disease)   . CLL (chronic lymphocytic leukemia) ([place]) 03/18/2011  . COPD (chronic obstructive pulmonary disease) ([place])   . DM type 2 (diabetes mellitus, type 2) ([place])   . ECRB (extensor carpi radialis brevis) tenosynovitis   . Foley catheter in place 11/2019   last changed 04-14-2020  . GERD (gastroesophageal reflux disease)    takes Nexium if needed  . History of blood transfusion 2015  .  Hyperlipidemia   . Insomnia   . 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 MI, acute, non ST segment elevation ([place]) 06/28/2009   with stenting of the LAD  . Neuromuscular disorder ([place])    peripheral neuropathy both all toes  . Pneumonia 11/2019  . PVD (peripheral vascular disease) ([place])   . Thrombocytopenia ([place])   . Tobacco abuse     Past Surgical History:  Procedure Laterality Date  . ADENOIDECTOMY  1955  . CARDIAC CATHETERIZATION    . CARDIAC CATHETERIZATION N/A 09/26/2014   Procedure: Left Heart Cath and Coronary Angiography;  Surgeon: Peter M Martinique, MD;  Location: [place] [facility];  Service: Cardiovascular;  Laterality: N/A;  . carpel tunnel release Left 04-1989  . carpel tunnel release  Right 01-1989  . CHOLECYSTECTOMY  2007   lapa  . CORONARY STENT PLACEMENT  06/2009   stent to lad  . femoral stents  09/2014   iliac stent  . IR CV LINE INJECTION  08/18/2017  . IR CV LINE INJECTION  09/01/2017  . IR CV LINE INJECTION  02/02/2018  . LATERAL EPICONDYLE RELEASE Left 02/12/2014   Procedure: LEFT ELBOW DEBRIDEMENT WITH TENDON REPAIR ;  Surgeon: Lorn Junes, MD;  Location: [facility];  Service: Orthopedics;  Laterality: Left;  . LEFT CAI STENT/PTA AND POPLITEAL ARTERY/TIBIAL THROMBECTOMY     . LEFT HEART CATHETERIZATION WITH CORONARY ANGIOGRAM N/A 08/26/2011   Procedure: LEFT HEART CATHETERIZATION WITH CORONARY ANGIOGRAM;  Surgeon: Peter M Martinique, MD;  Location: [facility];  Service: Cardiovascular;  Laterality: N/A;  . PERIPHERAL VASCULAR CATHETERIZATION N/A 01/01/2015   Procedure: Abdominal Aortogram;  Surgeon: Conrad Frankfort, MD;  Location: [place] [facility];  Service: Cardiovascular;  Laterality: N/A;  . port a cath insertion  2014   right   . TARSAL TUNNEL RELEASE Bilateral 08-2007    Allergies:  Allergies  Allergen Reactions  . Immune Globulin Hives and Rash    Pt reports hives/rash on body with IV Privigen. He was changed to IV  Gamunex-C and had many infusions without adverse side effects.   . Codeine Hives    Pt states he can take a few, more reaction with extended doses.    Family History  Problem Relation Age of Onset  . Lung cancer Mother 12  . Cancer Mother        lung  . Heart failure Father 65  . Heart disease Father     Social History:  reports that he quit smoking about 3 years ago. His smoking use included cigarettes. He has a 44.00 pack-year smoking history. He has quit using smokeless tobacco. He reports previous alcohol use. He reports that he does not use drugs.  ROS: A complete review of systems was performed.  All systems are negative except for pertinent findings as noted.  Physical Exam:  Vital signs in last 24 hours: Temp:  [97.7 F (36.5 C)] 97.7 F (36.5 C) (03/07 1336) Pulse Rate:  [15] 15 (03/07 1336) Resp:  [15] 15 (03/07 1336) BP: (109)/(66) 109/66 (03/07 1336) SpO2:  [99 %] 99 % (03/07 1336) Weight:  [74.9 kg] 74.9 kg (03/07 1336) Constitutional:  Alert and oriented, No acute distress Cardiovascular: Regular rate and rhythm Respiratory: Normal respiratory effort, Lungs clear bilaterally GI: Abdomen is soft, nontender, nondistended, no abdominal masses GU: No CVA tenderness Lymphatic: No lymphadenopathy Neurologic: Grossly intact, no focal deficits Psychiatric: Normal mood and affect  Laboratory Data:  Recent Labs    04/20/20 1332  HGB 14.3  HCT 42.0    Recent Labs    04/20/20 1332  NA 139  K 4.8  CL 101  GLUCOSE 86  BUN 17  CREATININE 0.70     Results for orders placed or performed during the hospital encounter of 04/20/20 (from the past 24 hour(s))  I-STAT, chem 8     Status: None   Collection Time: 04/20/20  1:32 PM  Result Value Ref Range   Sodium 139 135 - 145 mmol/L   Potassium 4.8 3.5 - 5.1 mmol/L   Chloride 101 98 - 111 mmol/L   BUN 17 8 -  23 mg/dL   Creatinine, Ser 0.70 0.61 - 1.24 mg/dL   Glucose, Bld 86 70 - 99 mg/dL   Calcium, Ion 1.34  1.15 - 1.40 mmol/L   TCO2 26 22 - 32 mmol/L   Hemoglobin 14.3 13.0 - 17.0 g/dL   HCT 42.0 39.0 - 52.0 %   *Note: Due to a large number of results and/or encounters for the requested time period, some results have not been displayed. A complete set of results can be found in Results Review.   Recent Results (from the past 240 hour(s))  SARS CORONAVIRUS 2 (TAT 6-24 HRS) Nasopharyngeal Nasopharyngeal Swab     Status: None   Collection Time: 04/17/20  2:32 PM   Specimen: Nasopharyngeal Swab  Result Value Ref Range Status   SARS Coronavirus 2 NEGATIVE NEGATIVE Final    Comment: (NOTE) SARS-CoV-2 target nucleic acids are NOT DETECTED.  The SARS-CoV-2 RNA is generally detectable in upper and lower respiratory specimens during the acute phase of infection. Negative results do not preclude SARS-CoV-2 infection, do not rule out co-infections with other pathogens, and should not be used as the sole basis for treatment or other patient management decisions. Negative results must be combined with clinical observations, patient history, and epidemiological information. The expected result is Negative.  Fact Sheet for Patients: SugarRoll.be  Fact Sheet for Healthcare Providers: https://www.woods-mathews.com/  This test is not yet approved or cleared by the [place] FDA and  has been authorized for detection and/or diagnosis of SARS-CoV-2 by FDA under an Emergency Use Authorization (EUA). This EUA will remain  in effect (meaning this test can be used) for the duration of the COVID-19 declaration under Se ction 564(b)(1) of the Act, 21 U.S.C. section 360bbb-3(b)(1), unless the authorization is terminated or revoked sooner.  Performed at [facility], [place] [address] [place], [place] [address]     Renal Function: Recent Labs    04/20/20 1332  CREATININE 0.70   Estimated Creatinine Clearance: 93.6 mL/min (by C-G formula based on SCr of  0.7 mg/dL).  Radiologic Imaging: No results found.  I independently reviewed the above imaging studies.  Assessment and Plan YUCHEN FEDOR is a 70 y.o. male with areflexic bladder here for cystoscopy, suprapubic tube placement. Ok to proceed.     Matt R. Gay MD 04/20/2020, 2:56 PM  [facility] Pager: [phone]

## 2020-04-20 NOTE — Discharge Instructions (Signed)
Activity:  You are encouraged to ambulate frequently (about every hour during waking hours) to help prevent blood clots from forming in your legs or lungs.     Diet: You should advance your diet as instructed by your physician.  It will be normal to have some bloating, nausea, and abdominal discomfort intermittently.   Prescriptions:  You will be provided a prescription for pain medication to take as needed.  If your pain is not severe enough to require the prescription pain medication, you may take extra strength Tylenol instead which will have less side effects.  You should also take a prescribed stool softener to avoid straining with bowel movements as the prescription pain medication may constipate you.   What to call us about: You should call the office [phone]) if you develop fever > 101 or develop persistent vomiting. Activity:  You are encouraged to ambulate frequently (about every hour during waking hours) to help prevent blood clots from forming in your legs or lungs.    You have a suprapubic catheter in place. Return to clinic in 1 month for exchange.  Suprapubic Catheter Replacement  What can I expect after procedure? After the procedure, it is common to have:  Some discomfort around the opening in your abdomen. Follow these instructions at home: Caring for the skin around the catheter Use a clean washcloth and soapy water to clean the skin around your catheter every day. Pat the area dry with a clean towel.  Do not pull on the catheter.  Do not use ointment or lotion on this area unless told by your health care provider.  Check the skin around the catheter every day for signs of infection. Check for: ? Redness, swelling, or pain. ? Fluid or blood. ? Warmth. ? Pus or a bad smell. Caring for the catheter  Clean the catheter with soap and water as often as told by your health care provider.  Always make sure there are no twists or curls (kinks) in the  catheter.  As soon as you are able to move, you may use a leg bag to collect the urine. ? Make sure that the tubing is straight and without kinks. ? Wrap an ace bandage gently over the tubing and around your leg to minimize the risk of the bag getting pulled out. Emptying the collection bag Empty the large collection bag every 8 hours. Empty the small collection bag when it is about ? full. To empty your large or small collection bag, take the following steps:  Always keep the bag below the level of the catheter. This keeps urine from flowing backward into the catheter.  Hold the bag over the toilet or another container. Turn the valve (spigot) at the bottom of the bag to empty the urine. ? Do not touch the opening of the spigot. ? Do not let the opening touch the toilet or container.  Close the spigot tightly when the bag is empty. Cleaning the collection bag  Wash your hands with soap and water. If soap and water are not available, use hand sanitizer.  Disconnect the bag from the catheter and immediately attach a new bag to the catheter.  Empty the used bag completely.  Clean the used bag according to the manufacturer's instructions, or as told by your health care provider.  Let the bag dry completely, and put it in a clean plastic bag before storing it.   General instructions  Always wash your hands before and after caring  for your catheter and collection bag. Use soap and water. If soap and water are not available, use hand sanitizer.  Always make sure there are no leaks in the catheter or collection bag.  Drink enough fluid to keep your urine pale yellow.  If you were prescribed an antibiotic medicine, take it as told by your health care provider. Do not stop taking the antibiotic even if you start to feel better.  Do not take baths, swim, or use a hot tub.  Keep all follow-up visits as told by your health care provider. This is important.   Contact a health care provider  if:  You leak urine.  You have redness, swelling, or pain around your catheter opening.  You have fluid or blood coming from your catheter opening.  Your catheter opening feels warm to the touch.  You have pus or a bad smell coming from your catheter opening.  You have a fever or chills.  Your urine flow slows down.  Your urine becomes cloudy or smelly. Get help right away if:  Your catheter comes out.  You feel nauseous.  You have back pain.  You have difficulty changing your catheter.  You have blood in your urine.  You have no urine flow for 1 hour. Summary  Suprapubic catheter replacement is a procedure to remove an old catheter and insert a new, clean catheter.  Make sure that you understand how to care for your catheter, your collection bag, and the opening in your abdomen.  Always wash your hands before and after caring for your catheter and collection bag.  Contact a health care provider if you leak urine or have a fever or any signs of infection around the catheter opening, or if your urine becomes cloudy or smelly.  Get help right away if your catheter comes out or you have nausea, back pain, blood in your urine, no urine flow for 1 hour, or difficulty changing your catheter. This information is not intended to replace advice given to you by your health care provider. Make sure you discuss any questions you have with your health care provider. Document Revised: 09/20/2017 Document Reviewed: 09/20/2017 Elsevier Patient Education  2021 [place] Instructions  Activity: Get plenty of rest for the remainder of the day. A responsible individual must stay with you for 24 hours following the procedure.  For the next 24 hours, DO NOT: -Drive a car -Paediatric nurse -Drink alcoholic beverages -Take any medication unless instructed by your physician -Make any legal decisions or sign important papers.  Meals: Start with liquid  foods such as gelatin or soup. Progress to regular foods as tolerated. Avoid greasy, spicy, heavy foods. If nausea and/or vomiting occur, drink only clear liquids until the nausea and/or vomiting subsides. Call your physician if vomiting continues.  Special Instructions/Symptoms: Your throat may feel dry or sore from the anesthesia or the breathing tube placed in your throat during surgery. If this causes discomfort, gargle with warm salt water. The discomfort should disappear within 24 hours.

## 2020-04-20 NOTE — Anesthesia Procedure Notes (Signed)
Procedure Name: LMA Insertion Date/Time: 04/20/2020 3:25 PM Performed by: Rogers Blocker, CRNA Pre-anesthesia Checklist: Patient identified, Emergency Drugs available, Suction available and Patient being monitored Patient Re-evaluated:Patient Re-evaluated prior to induction Oxygen Delivery Method: Circle System Utilized Preoxygenation: Pre-oxygenation with 100% oxygen Induction Type: IV induction Ventilation: Mask ventilation without difficulty LMA: LMA inserted LMA Size: 5.0 Number of attempts: 1 Placement Confirmation: positive ETCO2 Tube secured with: Tape Dental Injury: Teeth and Oropharynx as per pre-operative assessment

## 2020-04-20 NOTE — Anesthesia Preprocedure Evaluation (Addendum)
Anesthesia Evaluation    Reviewed: Allergy & Precautions, Patient's Chart, lab work & pertinent test results, Unable to perform ROS - Chart review only  Airway Mallampati: II  TM Distance: >3 FB     Dental  (+) Teeth Intact, Chipped, Dental Advisory Given   Pulmonary pneumonia, COPD, Current Smoker, former smoker,    breath sounds clear to auscultation       Cardiovascular hypertension, + CAD, + Past MI, + Cardiac Stents, + Peripheral Vascular Disease and +CHF   Rhythm:Regular  Echo 12/2019 1. Left ventricular ejection fraction, by estimation, is 60 to 65%. The left ventricle has normal function. The left ventricle has no regional wall motion abnormalities. Left ventricular diastolic parameters are consistent with Grade I diastolic dysfunction (impaired relaxation).  2. Right ventricular systolic function is normal. The right ventricular size is normal. There is mildly elevated pulmonary artery systolic pressure. The estimated right ventricular systolic pressure is 42.8 mmHg.  3. Left atrial size was moderately dilated.  4. The mitral valve is abnormal. Mild mitral valve regurgitation.  5. The aortic valve is tricuspid. Aortic valve regurgitation is not visualized.  6. Aortic dilatation noted. There is mild dilatation of the aortic root, measuring 39 mm.  7. The inferior vena cava is dilated in size with <50% respiratory variability, suggesting right atrial pressure of 15 mmHg.    Neuro/Psych Anxiety  Neuromuscular disease    GI/Hepatic GERD  Medicated and Controlled,  Endo/Other  diabetes, Well Controlled, Type 2, Oral Hypoglycemic Agents  Renal/GU      Musculoskeletal  (+) Arthritis ,   Abdominal (+)  Abdomen: soft. Bowel sounds: normal.  Peds  Hematology  (+) Blood dyscrasia, anemia ,   Anesthesia Other Findings   Reproductive/Obstetrics                           Anesthesia  Physical  Anesthesia Plan  ASA: III  Anesthesia Plan: General   Post-op Pain Management:    Induction: Intravenous  PONV Risk Score and Plan: 4 or greater and Ondansetron, Dexamethasone, Midazolam, Treatment may vary due to age or medical condition and Diphenhydramine  Airway Management Planned: LMA  Additional Equipment:   Intra-op Plan:   Post-operative Plan: Extubation in OR  Informed Consent:   Plan Discussed with:   Anesthesia Plan Comments:        Anesthesia Quick Evaluation

## 2020-04-20 NOTE — Op Note (Signed)
Operative Note  Preoperative diagnosis:  1.  Areflexic bladder  Postoperative diagnosis: 1.  Areflexic bladder  Procedure(s): 1.  Cystoscopy 2. Open suprapubic catheter placement  Surgeon: Rexene Alberts, MD  Assistants:  None  Anesthesia:  General  Complications:  None  EBL:  Minimal  Specimens: 1. None  Drains/Catheters: 1.  16 Fr suprapubic catheter  Intraoperative findings:   1. Cystoscopy demonstrated mildly obstructing prostate. No suspicious bladder lesions. Evidence of chronic irritation on the posterior aspect of the bladder. 2. Successful placement of 16 Fr suprapubic catheter without difficulty.  Indication:  Ryan Copeland is a 70 y.o. male with areflexic bladder.  Description of procedure: After informed consent was obtained from the patient, the patient was identified and taken to the operating room and placed in the supine position.  General anesthesia was administered as well as perioperative IV antibiotics.  At the beginning of the case, a time-out was performed to properly identify the patient, the surgery to be performed, and the surgical site.  Sequential compression devices were applied to the lower extremities at the beginning of the case for DVT prophylaxis.  The patient was then placed in the dorsal lithotomy supine position, prepped and draped in sterile fashion.  We passed the 21-French rigid cystoscope through the urethra and into the bladder under vision without any difficulty , noting a normal urethra without strictures and a mildly obstructing prostate.  A systematic evaluation of the bladder revealed no evidence of any suspicious bladder lesions.  Ureteral orifices were in normal position.    The patient was placed into Trendelenburg position.  After placing the patient into Trendelenburg, we located his pubic symphysis.  Next, a 1 cm vertical incision was performed 2 fingerbreadths superior to the pubic symphysis.  We then introduced and spread the  subcutaneous tissues using a Kelly. The fascia was opened with a 15 blade.  We then introduced a spinal needle into the bladder and noted this was introducing into the midline on the dome of the bladder with good urine flow out of the needle.  We then removed the spinal needle and then introduced the trocar into the bladder. A 16-French Foley catheter was placed through the trocar into the bladder. We then injected 10 mL of sterile water into the balloon.  The Foley was sutured in place using 2-0 Prolene sutures. The remainder of his incision was closed with 2-0 vicryl suture. We applied gauze dressing and tape over the site. The patient tolerated the procedure well and there was no complication. Patient was awoken from anesthesia and taken to the recovery room in stable condition. I was present and scrubbed for the entirety of the case.  Plan:  Discharge home. F/u in the office as scheduled. He will have his first SPT exchange performed in the office in 1 month.  [facility]  Pager: [phone]

## 2020-04-21 ENCOUNTER — Encounter (HOSPITAL_BASED_OUTPATIENT_CLINIC_OR_DEPARTMENT_OTHER): Payer: Self-pay | Admitting: Urology

## 2020-04-21 NOTE — Anesthesia Postprocedure Evaluation (Signed)
Anesthesia Post Note  Patient: Ryan Copeland  Procedure(s) Performed: CYSTOSCOPY WITH SUPRA PUBIC PLACEMENT (N/A Bladder)     Patient location during evaluation: PACU Anesthesia Type: General Level of consciousness: sedated and patient cooperative Pain management: pain level controlled Vital Signs Assessment: post-procedure vital signs reviewed and stable Respiratory status: spontaneous breathing Cardiovascular status: stable Anesthetic complications: no   No complications documented.  Last Vitals:  Vitals:   04/20/20 1625 04/20/20 1645  BP:  119/61  Pulse:  66  Resp:  12  Temp: 36.5 C (!) 36.4 C  SpO2:  98%    Last Pain:  Vitals:   04/21/20 1058  TempSrc:   PainSc: 2                  Nolon Nations

## 2020-05-01 ENCOUNTER — Encounter: Payer: Self-pay | Admitting: Hematology and Oncology

## 2020-05-01 ENCOUNTER — Inpatient Hospital Stay (HOSPITAL_BASED_OUTPATIENT_CLINIC_OR_DEPARTMENT_OTHER): Payer: Medicare Other | Admitting: Hematology and Oncology

## 2020-05-01 ENCOUNTER — Inpatient Hospital Stay: Payer: Medicare Other | Attending: Hematology and Oncology

## 2020-05-01 ENCOUNTER — Inpatient Hospital Stay: Payer: Medicare Other

## 2020-05-01 ENCOUNTER — Other Ambulatory Visit: Payer: Self-pay

## 2020-05-01 VITALS — BP 110/56 | HR 58 | Temp 98.3°F | Resp 18

## 2020-05-01 DIAGNOSIS — Z95828 Presence of other vascular implants and grafts: Secondary | ICD-10-CM

## 2020-05-01 DIAGNOSIS — I259 Chronic ischemic heart disease, unspecified: Secondary | ICD-10-CM

## 2020-05-01 DIAGNOSIS — D61818 Other pancytopenia: Secondary | ICD-10-CM

## 2020-05-01 DIAGNOSIS — D801 Nonfamilial hypogammaglobulinemia: Secondary | ICD-10-CM

## 2020-05-01 DIAGNOSIS — C8308 Small cell B-cell lymphoma, lymph nodes of multiple sites: Secondary | ICD-10-CM

## 2020-05-01 DIAGNOSIS — E538 Deficiency of other specified B group vitamins: Secondary | ICD-10-CM | POA: Diagnosis not present

## 2020-05-01 LAB — CBC WITH DIFFERENTIAL/PLATELET
Abs Immature Granulocytes: 0.04 10*3/uL (ref 0.00–0.07)
Basophils Absolute: 0 10*3/uL (ref 0.0–0.1)
Basophils Relative: 0 %
Eosinophils Absolute: 0.1 10*3/uL (ref 0.0–0.5)
Eosinophils Relative: 1 %
HCT: 33.8 % — ABNORMAL LOW (ref 39.0–52.0)
Hemoglobin: 11 g/dL — ABNORMAL LOW (ref 13.0–17.0)
Immature Granulocytes: 1 %
Lymphocytes Relative: 33 %
Lymphs Abs: 2 10*3/uL (ref 0.7–4.0)
MCH: 31.5 pg (ref 26.0–34.0)
MCHC: 32.5 g/dL (ref 30.0–36.0)
MCV: 96.8 fL (ref 80.0–100.0)
Monocytes Absolute: 0.4 10*3/uL (ref 0.1–1.0)
Monocytes Relative: 6 %
Neutro Abs: 3.6 10*3/uL (ref 1.7–7.7)
Neutrophils Relative %: 59 %
Platelets: 94 10*3/uL — ABNORMAL LOW (ref 150–400)
RBC: 3.49 MIL/uL — ABNORMAL LOW (ref 4.22–5.81)
RDW: 16.8 % — ABNORMAL HIGH (ref 11.5–15.5)
WBC: 6 10*3/uL (ref 4.0–10.5)
nRBC: 0 % (ref 0.0–0.2)

## 2020-05-01 LAB — COMPREHENSIVE METABOLIC PANEL
ALT: 25 U/L (ref 0–44)
AST: 22 U/L (ref 15–41)
Albumin: 3.3 g/dL — ABNORMAL LOW (ref 3.5–5.0)
Alkaline Phosphatase: 124 U/L (ref 38–126)
Anion gap: 6 (ref 5–15)
BUN: 14 mg/dL (ref 8–23)
CO2: 23 mmol/L (ref 22–32)
Calcium: 8.4 mg/dL — ABNORMAL LOW (ref 8.9–10.3)
Chloride: 110 mmol/L (ref 98–111)
Creatinine, Ser: 0.69 mg/dL (ref 0.61–1.24)
GFR, Estimated: >60 mL/min (ref >60)
Glucose, Bld: 108 mg/dL — ABNORMAL HIGH (ref 70–99)
Potassium: 4.2 mmol/L (ref 3.5–5.1)
Sodium: 139 mmol/L (ref 135–145)
Total Bilirubin: 0.3 mg/dL (ref 0.3–1.2)
Total Protein: 6 g/dL — ABNORMAL LOW (ref 6.5–8.1)

## 2020-05-01 MED ORDER — SODIUM CHLORIDE 0.9% FLUSH
10.0000 mL | Freq: Once | INTRAVENOUS | Status: AC
  Administered 2020-05-01: 10 mL
  Filled 2020-05-01: qty 10

## 2020-05-01 MED ORDER — IMMUNE GLOBULIN (HUMAN) 10 GM/100ML IJ SOLN
0.5000 g/kg | Freq: Once | INTRAMUSCULAR | Status: AC
Start: 2020-05-01 — End: 2020-05-01
  Administered 2020-05-01: 40 g via INTRAVENOUS
  Filled 2020-05-01: qty 400

## 2020-05-01 MED ORDER — DEXTROSE 5 % IV SOLN
INTRAVENOUS | Status: DC
  Filled 2020-05-01: qty 250

## 2020-05-01 MED ORDER — ACETAMINOPHEN 325 MG PO TABS
650.0000 mg | ORAL_TABLET | Freq: Once | ORAL | Status: AC
  Administered 2020-05-01: 650 mg via ORAL

## 2020-05-01 MED ORDER — SODIUM CHLORIDE 0.9% FLUSH
10.0000 mL | Freq: Once | INTRAVENOUS | Status: AC
Start: 2020-05-01 — End: 2020-05-01
  Administered 2020-05-01: 10 mL
  Filled 2020-05-01: qty 10

## 2020-05-01 MED ORDER — DIPHENHYDRAMINE HCL 25 MG PO TABS
25.0000 mg | ORAL_TABLET | Freq: Once | ORAL | Status: AC
  Administered 2020-05-01: 25 mg via ORAL
  Filled 2020-05-01: qty 1

## 2020-05-01 MED ORDER — METHYLPREDNISOLONE SODIUM SUCC 40 MG IJ SOLR
40.0000 mg | Freq: Once | INTRAMUSCULAR | Status: AC
  Administered 2020-05-01: 40 mg via INTRAVENOUS

## 2020-05-01 MED ORDER — CYANOCOBALAMIN 1000 MCG/ML IJ SOLN
1000.0000 ug | Freq: Once | INTRAMUSCULAR | Status: AC
  Administered 2020-05-01: 1000 ug via INTRAMUSCULAR

## 2020-05-01 MED ORDER — DIPHENHYDRAMINE HCL 25 MG PO CAPS
ORAL_CAPSULE | ORAL | Status: AC
  Filled 2020-05-01: qty 1

## 2020-05-01 MED ORDER — ACETAMINOPHEN 325 MG PO TABS
ORAL_TABLET | ORAL | Status: AC
  Filled 2020-05-01: qty 2

## 2020-05-01 MED ORDER — METHYLPREDNISOLONE SODIUM SUCC 40 MG IJ SOLR
INTRAMUSCULAR | Status: AC
  Filled 2020-05-01: qty 1

## 2020-05-01 MED ORDER — HEPARIN SOD (PORK) LOCK FLUSH 100 UNIT/ML IV SOLN
500.0000 [IU] | Freq: Once | INTRAVENOUS | Status: AC
  Administered 2020-05-01: 500 [IU]
  Filled 2020-05-01: qty 5

## 2020-05-01 MED ORDER — CYANOCOBALAMIN 1000 MCG/ML IJ SOLN
INTRAMUSCULAR | Status: AC
  Filled 2020-05-01: qty 1

## 2020-05-01 NOTE — Patient Instructions (Signed)
Immune Globulin Injection What is this medicine? IMMUNE GLOBULIN (im MUNE GLOB yoo lin) helps to prevent or reduce the severity of certain infections in patients who are at risk. This medicine is collected from the pooled blood of many donors. It is used to treat immune system problems, thrombocytopenia, and Kawasaki syndrome. This medicine may be used for other purposes; ask your health care provider or pharmacist if you have questions. COMMON BRAND NAME(S): ASCENIV, Baygam, BIVIGAM, Carimune, Carimune NF, cutaquig, Cuvitru, Flebogamma, Flebogamma DIF, GamaSTAN, GamaSTAN S/D, Gamimune N, Gammagard, Gammagard S/D, Gammaked, Gammaplex, Gammar-P IV, Gamunex, Gamunex-C, Hizentra, Iveegam, Iveegam EN, Octagam, Panglobulin, Panglobulin NF, panzyga, Polygam S/D, Privigen, Sandoglobulin, Venoglobulin-S, Vigam, Vivaglobulin, Xembify What should I tell my health care provider before I take this medicine? They need to know if you have any of these conditions:  diabetes  extremely low or no immune antibodies in the blood  heart disease  history of blood clots  hyperprolinemia  infection in the blood, sepsis  kidney disease  recently received or scheduled to receive a vaccination  an unusual or allergic reaction to human immune globulin, albumin, maltose, sucrose, other medicines, foods, dyes, or preservatives  pregnant or trying to get pregnant  breast-feeding How should I use this medicine? This medicine is for injection into a muscle or infusion into a vein or skin. It is usually given by a health care professional in a hospital or clinic setting. In rare cases, some brands of this medicine might be given at home. You will be taught how to give this medicine. Use exactly as directed. Take your medicine at regular intervals. Do not take your medicine more often than directed. Talk to your pediatrician regarding the use of this medicine in children. While this drug may be prescribed for selected  conditions, precautions do apply. Overdosage: If you think you have taken too much of this medicine contact a poison control center or emergency room at once. NOTE: This medicine is only for you. Do not share this medicine with others. What if I miss a dose? It is important not to miss your dose. Call your doctor or health care professional if you are unable to keep an appointment. If you give yourself the medicine and you miss a dose, take it as soon as you can. If it is almost time for your next dose, take only that dose. Do not take double or extra doses. What may interact with this medicine?  aspirin and aspirin-like medicines  cisplatin  cyclosporine  medicines for infection like acyclovir, adefovir, amphotericin B, bacitracin, cidofovir, foscarnet, ganciclovir, gentamicin, pentamidine, vancomycin  NSAIDS, medicines for pain and inflammation, like ibuprofen or naproxen  pamidronate  vaccines  zoledronic acid This list may not describe all possible interactions. Give your health care provider a list of all the medicines, herbs, non-prescription drugs, or dietary supplements you use. Also tell them if you smoke, drink alcohol, or use illegal drugs. Some items may interact with your medicine. What should I watch for while using this medicine? Your condition will be monitored carefully while you are receiving this medicine. This medicine is made from pooled blood donations of many different people. It may be possible to pass an infection in this medicine. However, the donors are screened for infections and all products are tested for HIV and hepatitis. The medicine is treated to kill most or all bacteria and viruses. Talk to your doctor about the risks and benefits of this medicine. Do not have vaccinations for at least   14 days before, or until at least 3 months after receiving this medicine. What side effects may I notice from receiving this medicine? Side effects that you should report  to your doctor or health care professional as soon as possible:  allergic reactions like skin rash, itching or hives, swelling of the face, lips, or tongue  blue colored lips or skin  breathing problems  chest pain or tightness  fever  signs and symptoms of aseptic meningitis such as stiff neck; sensitivity to light; headache; drowsiness; fever; nausea; vomiting; rash  signs and symptoms of a blood clot such as chest pain; shortness of breath; pain, swelling, or warmth in the leg  signs and symptoms of hemolytic anemia such as fast heartbeat; tiredness; dark yellow or brown urine; or yellowing of the eyes or skin  signs and symptoms of kidney injury like trouble passing urine or change in the amount of urine  sudden weight gain  swelling of the ankles, feet, hands Side effects that usually do not require medical attention (report to your doctor or health care professional if they continue or are bothersome):  diarrhea  flushing  headache  increased sweating  joint pain  muscle cramps  muscle pain  nausea  pain, redness, or irritation at site where injected  tiredness This list may not describe all possible side effects. Call your doctor for medical advice about side effects. You may report side effects to FDA at 1-800-FDA-1088. Where should I keep my medicine? Keep out of the reach of children. This drug is usually given in a hospital or clinic and will not be stored at home. In rare cases, some brands of this medicine may be given at home. If you are using this medicine at home, you will be instructed on how to store this medicine. Throw away any unused medicine after the expiration date on the label. NOTE: This sheet is a summary. It may not cover all possible information. If you have questions about this medicine, talk to your doctor, pharmacist, or health care provider.  2021 Elsevier/Gold Standard (2018-09-05 12:51:14)  

## 2020-05-01 NOTE — Assessment & Plan Note (Signed)
He is noted to have vitamin B12 deficiency I plan to recheck in his next visit

## 2020-05-01 NOTE — Assessment & Plan Note (Signed)
We discussed monthly IVIG for the next few months and he is in agreement to proceed

## 2020-05-01 NOTE — Assessment & Plan Note (Signed)
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

## 2020-05-01 NOTE — Patient Instructions (Signed)
Implanted Port Insertion, Care After This sheet gives you information about how to care for yourself after your procedure. Your health care provider may also give you more specific instructions. If you have problems or questions, contact your health care provider. What can I expect after the procedure? After the procedure, it is common to have:  Discomfort at the port insertion site.  Bruising on the skin over the port. This should improve over 3-4 days. Follow these instructions at home: Port care  After your port is placed, you will get a manufacturer's information card. The card has information about your port. Keep this card with you at all times.  Take care of the port as told by your health care provider. Ask your health care provider if you or a family member can get training for taking care of the port at home. A home health care nurse may also take care of the port.  Make sure to remember what type of port you have. Incision care  Follow instructions from your health care provider about how to take care of your port insertion site. Make sure you: ? Wash your hands with soap and water before and after you change your bandage (dressing). If soap and water are not available, use hand sanitizer. ? Change your dressing as told by your health care provider. ? Leave stitches (sutures), skin glue, or adhesive strips in place. These skin closures may need to stay in place for 2 weeks or longer. If adhesive strip edges start to loosen and curl up, you may trim the loose edges. Do not remove adhesive strips completely unless your health care provider tells you to do that.  Check your port insertion site every day for signs of infection. Check for: ? Redness, swelling, or pain. ? Fluid or blood. ? Warmth. ? Pus or a bad smell.      Activity  Return to your normal activities as told by your health care provider. Ask your health care provider what activities are safe for you.  Do not  lift anything that is heavier than 10 lb (4.5 kg), or the limit that you are told, until your health care provider says that it is safe. General instructions  Take over-the-counter and prescription medicines only as told by your health care provider.  Do not take baths, swim, or use a hot tub until your health care provider approves. Ask your health care provider if you may take showers. You may only be allowed to take sponge baths.  Do not drive for 24 hours if you were given a sedative during your procedure.  Wear a medical alert bracelet in case of an emergency. This will tell any health care providers that you have a port.  Keep all follow-up visits as told by your health care provider. This is important. Contact a health care provider if:  You cannot flush your port with saline as directed, or you cannot draw blood from the port.  You have a fever or chills.  You have redness, swelling, or pain around your port insertion site.  You have fluid or blood coming from your port insertion site.  Your port insertion site feels warm to the touch.  You have pus or a bad smell coming from the port insertion site. Get help right away if:  You have chest pain or shortness of breath.  You have bleeding from your port that you cannot control. Summary  Take care of the port as told by your   health care provider. Keep the manufacturer's information card with you at all times.  Change your dressing as told by your health care provider.  Contact a health care provider if you have a fever or chills or if you have redness, swelling, or pain around your port insertion site.  Keep all follow-up visits as told by your health care provider. This information is not intended to replace advice given to you by your health care provider. Make sure you discuss any questions you have with your health care provider. Document Revised: 08/29/2017 Document Reviewed: 08/29/2017 Elsevier Patient Education   2021 [employer]vier Inc.  

## 2020-05-01 NOTE — Assessment & Plan Note (Signed)
He tolerated Calquence well but his treatment course is complicated by recurrent infection and hospitalizations We will continue treatment as scheduled I recommend IVIG treatment along with aggressive supportive care So far, he has no hospitalization since we started IVIG

## 2020-05-01 NOTE — Progress Notes (Signed)
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 [facility]) He tolerated Calquence well but his treatment course is complicated by recurrent infection and hospitalizations We will continue treatment as scheduled I recommend IVIG treatment along with aggressive supportive care So far, he has no hospitalization since we started IVIG  Pancytopenia, acquired ([place]) He has multifactorial pancytopenia He is known to have 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 Vitamin B12    Standing Status:   Future    Standing Expiration Date:   05/01/2021    All questions were answered. The patient knows to call the clinic with any problems, questions or concerns. The total time spent in the appointment was 20 minutes encounter with patients including review of chart and various tests results, discussions about plan of care and coordination of care plan   [name], MD 05/01/2020 10:02 AM  INTERVAL HISTORY: Please see below for problem oriented charting. 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  No evidence of aortic dissection or other acute process in the chest. 2.  Centrilobular emphysema with a 5 mm right lung nodule. Given the concurrent centrilobular emphysema, follow-up chest CT at 6 -12 months is recommended.  3.  Coronary artery atherosclerosis which is age advanced. 4.  Prominent thoracic lymph nodes.  These can be reevaluated at follow-up.   05/06/2010 Imaging   1.  Multiple small periaortic lymph nodes consistent with the patient's history of the chronic lymphocytic leukemia. 2.  No evidence of solid organ involvement   11/03/2011 Imaging   1.  Interval progression of abdominal and pelvic adenopathy. 2.  Progression of splenomegaly.  The spleen now measures 23 cm in length   11/18/2011 Bone Marrow Biopsy   Bone Marrow, Aspirate,Biopsy, and Clot, right iliac bone - HYPERCELLULAR BONE MARROW WITH EXTENSIVE INVOLVEMENT BY CHRONIC LYMPHOCYTIC LEUKEMIA. PERIPHERAL BLOOD: - CHRONIC LYMPHOCYTIC LEUKEMIA   03/08/2012 Imaging   1.  Progressive increase in retroperitoneal, iliac, and inguinal lymphadenopathy. 2.  Interval increase in massive splenomegaly.    06/01/2012 Procedure   Placement of single lumen port a cath via right internal jugular vein.  The catheter tip lies at the cavoatrial junction.  A power injectable port a cath was placed and is ready for immediate use   06/20/2012 - 11/23/2012 Chemotherapy   He received FCR x 6 cycles   12/19/2012 Imaging   Left common iliac stent. Abdominal vasculature remains patent. Improving supraclavicular and axillary lymphadenopathy. Residual right subpectoral  nodes measure up to 10 mm short axis. Improving retroperitoneal lymphadenopathy, measuring up to 16 mm short axis. Improving splenomegaly, measuring 18.7 cm.    01/20/2016 Imaging   1. Stable exam.  No new or progressive  findings. 2. No CT findings to explain odynophagia   09/02/2016 Pathology Results   The findings are consistent with involvement by previously known chronic lymphocytic leukemia   09/02/2016 Pathology Results   FISH for CLL came back positive for deletion 13q   09/15/2016 Imaging   1. Borderline enlarged abdominal and pelvic lymph nodes. Compared with 11/03/2011 these are decreased in size as detailed above. 2. Persistent splenomegaly. 3. Aortic Atherosclerosis (ICD10-I70.0). LAD coronary artery calcification noted.   10/06/2016 - 02/24/2017 Chemotherapy   He received Bendamustine and Rituxan   11/03/2016 Adverse Reaction   Dose of Bendamustine is reduced due to severe pancytopenia   12/28/2016 Imaging   1. Borderline enlarged abdominal peritoneal ligament and abdominal retroperitoneal lymph nodes, stable. 2. Splenomegaly. 3.  Aortic atherosclerosis (ICD10-170.0).   03/22/2017 PET scan   1. No hypermetabolic adenopathy identified within the neck, chest, abdomen or pelvis. 2. Prominent left retroperitoneal node measures 1.6 cm without significant FDG uptake. 3. Splenomegaly. 4. Aortic Atherosclerosis (ICD10-I70.0) and Emphysema (ICD10-J43.9). LAD and left circumflex atherosclerotic calcifications noted.   02/02/2018 Procedure   IMPRESSION: Widely patent right IJ power port catheter.   04/06/2018 Imaging   1. Interval enlargement of axillary, mediastinal, and retroperitoneal lymph nodes, as well as increased splenomegaly, findings concerning for progression of CLL in comparison to prior PET-CT dated 03/22/2017.  2.  Other chronic and incidental findings as detailed above.    04/16/2018 -  Chemotherapy   The patient had acalabrutinib for chemo   04/17/2018 - 04/21/2018 Hospital Admission   He was hospitalized for influenza   11/29/2018 Imaging   1. Interval response to therapy. No thoracic added not scratch set no thoracic or pelvic adenopathy identified. Significant improvement in  abdominal adenopathy. Largest remaining lymph node measures 1.3cm in the left retroperitoneal region. Previously 2.5 cm. 2. Persistent splenomegaly, improved from previous exam. 3. No new or progressive disease identified within the chest, abdomen or pelvis. 4. Stable appearance of 5 mm right middle lobe lung nodule. 5. Aortic Atherosclerosis (ICD10-I70.0) and Emphysema (ICD10-J43.9). Coronary artery calcifications.       REVIEW OF SYSTEMS:   Constitutional: Denies fevers, chills or abnormal weight loss Eyes: Denies blurriness of vision Ears, nose, mouth, throat, and face: Denies mucositis or sore throat Respiratory: Denies cough, dyspnea or wheezes Cardiovascular: Denies palpitation, chest discomfort or lower extremity swelling Gastrointestinal:  Denies nausea, heartburn or change in bowel habits Skin: Denies abnormal skin rashes Lymphatics: Denies new lymphadenopathy Neurological:Denies numbness, tingling or new weaknesses Behavioral/Psych: Mood is stable, no new changes  All other systems were reviewed with the patient and are negative.  I have reviewed the past medical history, past surgical history, social history and family history with the patient and they are unchanged from previous note.  ALLERGIES:  is allergic to immune globulin and codeine.  MEDICATIONS:  Current Outpatient Medications  Medication Sig Dispense Refill  . acetaminophen (TYLENOL) 325 MG tablet Take 2 tablets (650 mg total) by mouth every 6 (six) hours as needed for moderate pain or fever. 30 tablet 0  . acyclovir (ZOVIRAX) 400 MG tablet Take 1 tablet (400 mg total) by mouth 2 (two) times daily. 180 tablet 1  . allopurinol (ZYLOPRIM) 300 MG tablet TAKE 1 TABLET BY MOUTH EVERY DAY (Patient taking  differently: For cll) 90 tablet 1  . ALPRAZolam (XANAX) 0.5 MG tablet Take 0.5 mg by mouth as needed.    Marland Kitchen aspirin 325 MG tablet Take 325 mg by mouth daily.    Marland Kitchen b complex vitamins tablet Take 1 tablet by mouth  daily.     . benzonatate (TESSALON) 100 MG capsule TAKE TWO CAPSULES BY MOUTH THREE TIMES A DAY AS NEEDED 30 capsule 2  . CALQUENCE 100 MG capsule TAKE 1 CAPSULE BY MOUTH TWICE A DAY (Patient taking differently: No sig reported) 60 capsule 11  . Cholecalciferol 25 MCG (1000 UT) tablet Take 1,000 Units by mouth daily.     . diphenoxylate-atropine (LOMOTIL) 2.5-0.025 MG tablet Take 1 tablet by mouth 4 (four) times daily as needed for diarrhea or loose stools. (Patient taking differently: Take 1 tablet by mouth 4 (four) times daily as needed for diarrhea or loose stools. Immodium prn) 60 tablet 1  . famotidine (PEPCID) 20 MG tablet TAKE 1 TABLET BY MOUTH TWICE A DAY (Patient taking differently: 2 (two) times daily as needed.) 180 tablet 1  . HYDROcodone-acetaminophen (NORCO) 10-325 MG tablet Take 1 tablet by mouth every 6 (six) hours as needed.    . lidocaine-prilocaine (EMLA) cream Apply 1 application topically as needed.    . meclizine (ANTIVERT) 12.5 MG tablet Take 12.5 mg by mouth 3 (three) times daily as needed for dizziness.    . metFORMIN (GLUCOPHAGE-XR) 500 MG 24 hr tablet Take 500 mg by mouth every evening.    . midodrine (PROAMATINE) 5 MG tablet Take 1 tablet (5 mg total) by mouth 3 (three) times daily with meals. 270 tablet 3  . nitroGLYCERIN (NITROSTAT) 0.4 MG SL tablet PLACE 1 TABLET UNDER THE TONGUE EVERY 5 MINUTES X 3 DOSES AS NEEDED FOR CHEST PAIN *MAX 3 DOSES* 25 tablet 3  . pregabalin (LYRICA) 200 MG capsule TAKE 1 CAPSULE (200 MG TOTAL) BY MOUTH 3 (THREE) TIMES DAILY. 270 capsule 1  . rosuvastatin (CRESTOR) 5 MG tablet TAKE 1 TABLET BY MOUTH EVERY DAY (Patient taking differently: daily.) 90 tablet 3  . sildenafil (VIAGRA) 100 MG tablet Take 0.5 tablets (50 mg total) by mouth as needed for erectile dysfunction (30 mintues prior to sexual intercourse). 10 tablet 3  . tamsulosin (FLOMAX) 0.4 MG CAPS capsule Take 1 capsule (0.4 mg total) by mouth daily. (Patient taking differently: Take  0.4 mg by mouth daily after supper.) 30 capsule 0  . zolpidem (AMBIEN) 10 MG tablet Take 10 mg by mouth at bedtime as needed.     No current facility-administered medications for this visit.   Facility-Administered Medications Ordered in Other Visits  Medication Dose Route Frequency Provider Last Rate Last Admin  . 0.9 %  sodium chloride infusion   Intravenous Continuous [name], MD 50 mL/hr at 03/07/14 1005 New Bag at 03/07/14 1005  . sodium chloride 0.9 % injection 10 mL  10 mL Intracatheter PRN Marcy Panning, MD   10 mL at 08/22/12 1721    PHYSICAL EXAMINATION: ECOG PERFORMANCE STATUS: 1 - Symptomatic but completely ambulatory  Vitals:   05/01/20 0949  BP: (!) 110/56  Pulse: (!) 59  Resp: 17  Temp: (!) 97 F (36.1 C)  SpO2: 100%   Filed Weights   05/01/20 0949  Weight: 165 lb 6.4 oz (75 kg)    GENERAL:alert, no distress and comfortable SKIN: Noted significant skin bruising EYES: normal, Conjunctiva are pink and non-injected, sclera clear OROPHARYNX:no exudate, no erythema and lips, buccal mucosa,  and tongue normal  NECK: supple, thyroid normal size, non-tender, without nodularity LYMPH:  no palpable lymphadenopathy in the cervical, axillary or inguinal LUNGS: clear to auscultation and percussion with normal breathing effort HEART: regular rate & rhythm and no murmurs and no lower extremity edema ABDOMEN:abdomen soft, non-tender and normal bowel sounds Musculoskeletal:no cyanosis of digits and no clubbing  NEURO: alert & oriented x 3 with fluent speech, no focal motor/sensory deficits  LABORATORY DATA:  I have reviewed the data as listed    Component Value Date/Time   NA 139 04/20/2020 1332   NA 142 06/19/2019 0848   NA 136 01/26/2017 0940   K 4.8 04/20/2020 1332   K 4.0 01/26/2017 0940   CL 101 04/20/2020 1332   CL 104 08/03/2012 1229   CO2 25 04/03/2020 0916   CO2 23 01/26/2017 0940   GLUCOSE 86 04/20/2020 1332   GLUCOSE 146 (H) 01/26/2017 0940   GLUCOSE  223 (H) 08/03/2012 1229   BUN 17 04/20/2020 1332   BUN 13 06/19/2019 0848   BUN 9.6 01/26/2017 0940   CREATININE 0.70 04/20/2020 1332   CREATININE 1.04 04/24/2018 0821   CREATININE 1.0 01/26/2017 0940   CALCIUM 8.4 (L) 04/03/2020 0916   CALCIUM 8.7 01/26/2017 0940   PROT 5.5 (L) 04/03/2020 0916   PROT 5.9 (L) 04/13/2017 0823   PROT 5.7 (L) 01/26/2017 0940   ALBUMIN 3.5 04/03/2020 0916   ALBUMIN 3.6 01/26/2017 0940   AST 23 04/03/2020 0916   AST 13 (L) 04/24/2018 0821   AST 13 01/26/2017 0940   ALT 31 04/03/2020 0916   ALT 10 04/24/2018 0821   ALT 12 01/26/2017 0940   ALKPHOS 104 04/03/2020 0916   ALKPHOS 78 01/26/2017 0940   BILITOT 0.5 04/03/2020 0916   BILITOT 0.7 04/24/2018 0821   BILITOT 0.64 01/26/2017 0940   GFRNONAA >60 04/03/2020 0916   GFRNONAA >60 04/24/2018 0821   GFRAA >60 10/25/2019 0819   GFRAA >60 04/24/2018 0821    No results found for: SPEP, UPEP  Lab Results  Component Value Date   WBC 6.0 05/01/2020   NEUTROABS 3.6 05/01/2020   HGB 11.0 (L) 05/01/2020   HCT 33.8 (L) 05/01/2020   MCV 96.8 05/01/2020   PLT 94 (L) 05/01/2020      Chemistry      Component Value Date/Time   NA 139 04/20/2020 1332   NA 142 06/19/2019 0848   NA 136 01/26/2017 0940   K 4.8 04/20/2020 1332   K 4.0 01/26/2017 0940   CL 101 04/20/2020 1332   CL 104 08/03/2012 1229   CO2 25 04/03/2020 0916   CO2 23 01/26/2017 0940   BUN 17 04/20/2020 1332   BUN 13 06/19/2019 0848   BUN 9.6 01/26/2017 0940   CREATININE 0.70 04/20/2020 1332   CREATININE 1.04 04/24/2018 0821   CREATININE 1.0 01/26/2017 0940      Component Value Date/Time   CALCIUM 8.4 (L) 04/03/2020 0916   CALCIUM 8.7 01/26/2017 0940   ALKPHOS 104 04/03/2020 0916   ALKPHOS 78 01/26/2017 0940   AST 23 04/03/2020 0916   AST 13 (L) 04/24/2018 0821   AST 13 01/26/2017 0940   ALT 31 04/03/2020 0916   ALT 10 04/24/2018 0821   ALT 12 01/26/2017 0940   BILITOT 0.5 04/03/2020 0916   BILITOT 0.7 04/24/2018 0821    BILITOT 0.64 01/26/2017 0940

## 2020-05-08 MED FILL — CALQUENCE 100 MG CAPSULE: 100 | 30 days supply | Qty: 60 | Fill #2

## 2020-05-10 ENCOUNTER — Other Ambulatory Visit: Payer: Self-pay | Admitting: Hematology and Oncology

## 2020-05-10 DIAGNOSIS — C9110 Chronic lymphocytic leukemia of B-cell type not having achieved remission: Secondary | ICD-10-CM

## 2020-05-12 ENCOUNTER — Other Ambulatory Visit (HOSPITAL_COMMUNITY): Payer: Self-pay

## 2020-05-29 ENCOUNTER — Other Ambulatory Visit: Payer: Self-pay | Admitting: *Deleted

## 2020-05-29 ENCOUNTER — Inpatient Hospital Stay: Payer: Medicare Other

## 2020-05-29 ENCOUNTER — Inpatient Hospital Stay: Payer: Medicare Other | Attending: Hematology and Oncology | Admitting: Hematology and Oncology

## 2020-05-29 ENCOUNTER — Ambulatory Visit: Payer: Medicare Other

## 2020-05-29 ENCOUNTER — Other Ambulatory Visit: Payer: Self-pay

## 2020-05-29 ENCOUNTER — Other Ambulatory Visit: Payer: Medicare Other

## 2020-05-29 ENCOUNTER — Encounter: Payer: Self-pay | Admitting: Hematology and Oncology

## 2020-05-29 ENCOUNTER — Other Ambulatory Visit: Payer: Self-pay | Admitting: Hematology and Oncology

## 2020-05-29 VITALS — BP 80/48 | HR 44 | Temp 97.9°F | Resp 16

## 2020-05-29 DIAGNOSIS — E538 Deficiency of other specified B group vitamins: Secondary | ICD-10-CM

## 2020-05-29 DIAGNOSIS — K068 Other specified disorders of gingiva and edentulous alveolar ridge: Secondary | ICD-10-CM

## 2020-05-29 DIAGNOSIS — C8308 Small cell B-cell lymphoma, lymph nodes of multiple sites: Secondary | ICD-10-CM | POA: Diagnosis present

## 2020-05-29 DIAGNOSIS — D801 Nonfamilial hypogammaglobulinemia: Secondary | ICD-10-CM

## 2020-05-29 DIAGNOSIS — R197 Diarrhea, unspecified: Secondary | ICD-10-CM | POA: Diagnosis not present

## 2020-05-29 DIAGNOSIS — D61818 Other pancytopenia: Secondary | ICD-10-CM | POA: Diagnosis not present

## 2020-05-29 DIAGNOSIS — Z95828 Presence of other vascular implants and grafts: Secondary | ICD-10-CM

## 2020-05-29 DIAGNOSIS — I259 Chronic ischemic heart disease, unspecified: Secondary | ICD-10-CM

## 2020-05-29 LAB — COMPREHENSIVE METABOLIC PANEL
ALT: 15 U/L (ref 0–44)
AST: 17 U/L (ref 15–41)
Albumin: 3.5 g/dL (ref 3.5–5.0)
Alkaline Phosphatase: 120 U/L (ref 38–126)
Anion gap: 8 (ref 5–15)
BUN: 12 mg/dL (ref 8–23)
CO2: 26 mmol/L (ref 22–32)
Calcium: 8.3 mg/dL — ABNORMAL LOW (ref 8.9–10.3)
Chloride: 106 mmol/L (ref 98–111)
Creatinine, Ser: 0.73 mg/dL (ref 0.61–1.24)
GFR, Estimated: >60 mL/min (ref >60)
Glucose, Bld: 123 mg/dL — ABNORMAL HIGH (ref 70–99)
Potassium: 4.6 mmol/L (ref 3.5–5.1)
Sodium: 140 mmol/L (ref 135–145)
Total Bilirubin: 0.4 mg/dL (ref 0.3–1.2)
Total Protein: 5.7 g/dL — ABNORMAL LOW (ref 6.5–8.1)

## 2020-05-29 LAB — CBC WITH DIFFERENTIAL/PLATELET
Abs Immature Granulocytes: 0.03 10*3/uL (ref 0.00–0.07)
Basophils Absolute: 0 10*3/uL (ref 0.0–0.1)
Basophils Relative: 0 %
Eosinophils Absolute: 0.1 10*3/uL (ref 0.0–0.5)
Eosinophils Relative: 1 %
HCT: 37.3 % — ABNORMAL LOW (ref 39.0–52.0)
Hemoglobin: 11.8 g/dL — ABNORMAL LOW (ref 13.0–17.0)
Immature Granulocytes: 1 %
Lymphocytes Relative: 34 %
Lymphs Abs: 2.1 10*3/uL (ref 0.7–4.0)
MCH: 30.6 pg (ref 26.0–34.0)
MCHC: 31.6 g/dL (ref 30.0–36.0)
MCV: 96.6 fL (ref 80.0–100.0)
Monocytes Absolute: 0.4 10*3/uL (ref 0.1–1.0)
Monocytes Relative: 6 %
Neutro Abs: 3.6 10*3/uL (ref 1.7–7.7)
Neutrophils Relative %: 58 %
Platelets: 82 10*3/uL — ABNORMAL LOW (ref 150–400)
RBC: 3.86 MIL/uL — ABNORMAL LOW (ref 4.22–5.81)
RDW: 16.8 % — ABNORMAL HIGH (ref 11.5–15.5)
WBC: 6.2 10*3/uL (ref 4.0–10.5)
nRBC: 0 % (ref 0.0–0.2)

## 2020-05-29 LAB — VITAMIN B12: Vitamin B-12: >7500 pg/mL — ABNORMAL HIGH (ref 180–914)

## 2020-05-29 MED ORDER — METHYLPREDNISOLONE SODIUM SUCC 40 MG IJ SOLR
INTRAMUSCULAR | Status: AC
  Filled 2020-05-29: qty 1

## 2020-05-29 MED ORDER — DIPHENHYDRAMINE HCL 25 MG PO CAPS
ORAL_CAPSULE | ORAL | Status: AC
  Filled 2020-05-29: qty 1

## 2020-05-29 MED ORDER — METHYLPREDNISOLONE SODIUM SUCC 40 MG IJ SOLR
40.0000 mg | Freq: Once | INTRAMUSCULAR | Status: AC
  Administered 2020-05-29: 40 mg via INTRAVENOUS

## 2020-05-29 MED ORDER — ACETAMINOPHEN 325 MG PO TABS
650.0000 mg | ORAL_TABLET | Freq: Once | ORAL | Status: AC
  Administered 2020-05-29: 650 mg via ORAL

## 2020-05-29 MED ORDER — DEXTROSE 5 % IV SOLN
INTRAVENOUS | Status: DC
  Filled 2020-05-29: qty 250

## 2020-05-29 MED ORDER — ACETAMINOPHEN 325 MG PO TABS
ORAL_TABLET | ORAL | Status: AC
  Filled 2020-05-29: qty 2

## 2020-05-29 MED ORDER — SODIUM CHLORIDE 0.9% FLUSH
10.0000 mL | Freq: Once | INTRAVENOUS | Status: AC
  Administered 2020-05-29: 10 mL via INTRAVENOUS
  Filled 2020-05-29: qty 10

## 2020-05-29 MED ORDER — DIPHENHYDRAMINE HCL 25 MG PO TABS
25.0000 mg | ORAL_TABLET | Freq: Once | ORAL | Status: AC
  Administered 2020-05-29: 25 mg via ORAL
  Filled 2020-05-29: qty 1

## 2020-05-29 MED ORDER — IMMUNE GLOBULIN (HUMAN) 10 GM/100ML IJ SOLN
0.5500 g/kg | Freq: Once | INTRAMUSCULAR | Status: AC
  Administered 2020-05-29: 40 g via INTRAVENOUS
  Filled 2020-05-29: qty 400

## 2020-05-29 MED ORDER — CYANOCOBALAMIN 1000 MCG/ML IJ SOLN
1000.0000 ug | Freq: Once | INTRAMUSCULAR | Status: AC
  Administered 2020-05-29: 1000 ug via INTRAMUSCULAR

## 2020-05-29 MED ORDER — CYANOCOBALAMIN 1000 MCG/ML IJ SOLN
INTRAMUSCULAR | Status: AC
  Filled 2020-05-29: qty 1

## 2020-05-29 NOTE — Assessment & Plan Note (Addendum)
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

## 2020-05-29 NOTE — Assessment & Plan Note (Signed)
Exam is benign.  The little bump noted is within his gumline without appreciated oral lesions

## 2020-05-29 NOTE — Assessment & Plan Note (Signed)
He tolerated Calquence well but his treatment course is complicated by recurrent infection and hospitalizations We will continue treatment as scheduled I recommend IVIG treatment along with aggressive supportive care So far, he has no hospitalization since we started IVIG

## 2020-05-29 NOTE — Assessment & Plan Note (Signed)
He is noted to have vitamin B12 deficiency in the past I plan to recheck today

## 2020-05-29 NOTE — Progress Notes (Signed)
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 [facility]) He tolerated Calquence well but his treatment course is complicated by recurrent infection and hospitalizations We will continue treatment as scheduled I recommend IVIG treatment along with aggressive supportive care So far, he has no hospitalization since we started IVIG  Pancytopenia, acquired ([place]) 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  The little bump noted is within his gumline without appreciated oral lesions   No orders of the defined types were placed in this encounter.   All questions were answered. The patient knows to call the clinic with any problems, questions or concerns. The total time spent in the appointment was 25 minutes encounter with patients including review of chart and various tests results, discussions about plan of care and coordination of care plan   [name], MD 05/29/2020 9:25 AM  INTERVAL HISTORY: Please see below for problem oriented charting. 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  No evidence of aortic dissection or other acute process in the chest. 2.  Centrilobular emphysema with a 5 mm right lung nodule. Given the concurrent centrilobular emphysema, follow-up chest CT at 6 -12 months is recommended.  3.  Coronary artery atherosclerosis which is age advanced. 4.  Prominent thoracic lymph nodes.  These can be reevaluated at follow-up.   05/06/2010 Imaging   1.  Multiple small periaortic lymph nodes consistent with the patient's history of the chronic lymphocytic leukemia. 2.  No evidence of solid organ involvement   11/03/2011 Imaging   1.  Interval progression of abdominal and pelvic adenopathy. 2.  Progression of splenomegaly.  The spleen now measures 23 cm in length   11/18/2011 Bone Marrow Biopsy   Bone Marrow, Aspirate,Biopsy, and Clot, right iliac bone - HYPERCELLULAR BONE MARROW WITH EXTENSIVE INVOLVEMENT BY CHRONIC LYMPHOCYTIC LEUKEMIA. PERIPHERAL BLOOD: - CHRONIC LYMPHOCYTIC LEUKEMIA   03/08/2012 Imaging   1.  Progressive increase in retroperitoneal, iliac, and inguinal lymphadenopathy. 2.  Interval increase in massive splenomegaly.    06/01/2012 Procedure   Placement of single lumen port a cath via right internal jugular vein.  The catheter tip lies at the cavoatrial junction.  A power injectable port a cath was placed and is ready for immediate use   06/20/2012 - 11/23/2012 Chemotherapy   He received FCR x  6 cycles   12/19/2012 Imaging   Left common iliac stent. Abdominal vasculature remains patent. Improving supraclavicular and axillary lymphadenopathy. Residual right subpectoral nodes measure up to 10 mm short axis. Improving retroperitoneal lymphadenopathy,  measuring up to 16 mm short axis. Improving splenomegaly, measuring 18.7 cm.    01/20/2016 Imaging   1. Stable exam.  No new or progressive findings. 2. No CT findings to explain odynophagia   09/02/2016 Pathology Results   The findings are consistent with involvement by previously known chronic lymphocytic leukemia   09/02/2016 Pathology Results   FISH for CLL came back positive for deletion 13q   09/15/2016 Imaging   1. Borderline enlarged abdominal and pelvic lymph nodes. Compared with 11/03/2011 these are decreased in size as detailed above. 2. Persistent splenomegaly. 3. Aortic Atherosclerosis (ICD10-I70.0). LAD coronary artery calcification noted.   10/06/2016 - 02/24/2017 Chemotherapy   He received Bendamustine and Rituxan   11/03/2016 Adverse Reaction   Dose of Bendamustine is reduced due to severe pancytopenia   12/28/2016 Imaging   1. Borderline enlarged abdominal peritoneal ligament and abdominal retroperitoneal lymph nodes, stable. 2. Splenomegaly. 3.  Aortic atherosclerosis (ICD10-170.0).   03/22/2017 PET scan   1. No hypermetabolic adenopathy identified within the neck, chest, abdomen or pelvis. 2. Prominent left retroperitoneal node measures 1.6 cm without significant FDG uptake. 3. Splenomegaly. 4. Aortic Atherosclerosis (ICD10-I70.0) and Emphysema (ICD10-J43.9). LAD and left circumflex atherosclerotic calcifications noted.   02/02/2018 Procedure   IMPRESSION: Widely patent right IJ power port catheter.   04/06/2018 Imaging   1. Interval enlargement of axillary, mediastinal, and retroperitoneal lymph nodes, as well as increased splenomegaly, findings concerning for progression of CLL in comparison to prior PET-CT dated 03/22/2017.  2.  Other chronic and incidental findings as detailed above.    04/16/2018 -  Chemotherapy   The patient had acalabrutinib for chemo   04/17/2018 - 04/21/2018 Hospital Admission   He was hospitalized for influenza   11/29/2018 Imaging    1. Interval response to therapy. No thoracic added not scratch set no thoracic or pelvic adenopathy identified. Significant improvement in abdominal adenopathy. Largest remaining lymph node measures 1.3cm in the left retroperitoneal region. Previously 2.5 cm. 2. Persistent splenomegaly, improved from previous exam. 3. No new or progressive disease identified within the chest, abdomen or pelvis. 4. Stable appearance of 5 mm right middle lobe lung nodule. 5. Aortic Atherosclerosis (ICD10-I70.0) and Emphysema (ICD10-J43.9). Coronary artery calcifications.       REVIEW OF SYSTEMS:   Constitutional: Denies fevers, chills or abnormal weight loss Eyes: Denies blurriness of vision Ears, nose, mouth, throat, and face: Denies mucositis or sore throat Respiratory: Denies cough, dyspnea or wheezes Cardiovascular: Denies palpitation, chest discomfort or lower extremity swelling Skin: Denies abnormal skin rashes Lymphatics: Denies new lymphadenopathy  Neurological:Denies numbness, tingling or new weaknesses Behavioral/Psych: Mood is stable, no new changes  All other systems were reviewed with the patient and are negative.  I have reviewed the past medical history, past surgical history, social history and family history with the patient and they are unchanged from previous note.  ALLERGIES:  is allergic to immune globulin and codeine.  MEDICATIONS:  Current Outpatient Medications  Medication Sig Dispense Refill  . aspirin EC 81 MG tablet Take 81 mg by mouth daily. Swallow whole.    Marland Kitchen acalabrutinib (CALQUENCE) 100 MG capsule TAKE 1 CAPSULE BY MOUTH TWICE A DAY (Patient taking differently: No sig reported) 60 capsule 11  . acetaminophen (TYLENOL) 325 MG tablet Take 2 tablets (650  mg total) by mouth every 6 (six) hours as needed for moderate pain or fever. 30 tablet 0  . acyclovir (ZOVIRAX) 400 MG tablet Take 1 tablet (400 mg total) by mouth 2 (two) times daily. 180 tablet 1  . allopurinol  (ZYLOPRIM) 300 MG tablet TAKE 1 TABLET BY MOUTH EVERY DAY (Patient taking differently: For cll) 90 tablet 1  . ALPRAZolam (XANAX) 0.5 MG tablet Take 0.5 mg by mouth as needed.    Marland Kitchen b complex vitamins tablet Take 1 tablet by mouth daily.     . benzonatate (TESSALON) 100 MG capsule TAKE TWO CAPSULES BY MOUTH THREE TIMES A DAY AS NEEDED 30 capsule 2  . Cholecalciferol 25 MCG (1000 UT) tablet Take 1,000 Units by mouth daily.     . diphenoxylate-atropine (LOMOTIL) 2.5-0.025 MG tablet Take 1 tablet by mouth 4 (four) times daily as needed for diarrhea or loose stools. (Patient taking differently: Take 1 tablet by mouth 4 (four) times daily as needed for diarrhea or loose stools. Immodium prn) 60 tablet 1  . famotidine (PEPCID) 20 MG tablet TAKE 1 TABLET BY MOUTH TWICE A DAY (Patient taking differently: 2 (two) times daily as needed.) 180 tablet 1  . HYDROcodone-acetaminophen (NORCO) 10-325 MG tablet Take 1 tablet by mouth every 6 (six) hours as needed.    . lidocaine-prilocaine (EMLA) cream Apply 1 application topically as needed.    . loperamide (IMODIUM) 2 MG capsule Take 1 capsule (2 mg total) by mouth every 6 (six) hours as needed for diarrhea or loose stools. 60 capsule 2  . meclizine (ANTIVERT) 12.5 MG tablet Take 12.5 mg by mouth 3 (three) times daily as needed for dizziness.    . metFORMIN (GLUCOPHAGE-XR) 500 MG 24 hr tablet Take 500 mg by mouth every evening.    . midodrine (PROAMATINE) 5 MG tablet Take 1 tablet (5 mg total) by mouth 3 (three) times daily with meals. 270 tablet 3  . nitroGLYCERIN (NITROSTAT) 0.4 MG SL tablet PLACE 1 TABLET UNDER THE TONGUE EVERY 5 MINUTES X 3 DOSES AS NEEDED FOR CHEST PAIN *MAX 3 DOSES* 25 tablet 3  . pregabalin (LYRICA) 200 MG capsule TAKE 1 CAPSULE (200 MG TOTAL) BY MOUTH 3 (THREE) TIMES DAILY. 270 capsule 1  . rosuvastatin (CRESTOR) 5 MG tablet TAKE 1 TABLET BY MOUTH EVERY DAY (Patient taking differently: daily.) 90 tablet 3  . sildenafil (VIAGRA) 100 MG tablet  Take 0.5 tablets (50 mg total) by mouth as needed for erectile dysfunction (30 mintues prior to sexual intercourse). 10 tablet 3  . tamsulosin (FLOMAX) 0.4 MG CAPS capsule Take 1 capsule (0.4 mg total) by mouth daily. (Patient taking differently: Take 0.4 mg by mouth daily after supper.) 30 capsule 0  . zolpidem (AMBIEN) 10 MG tablet Take 10 mg by mouth at bedtime as needed.     No current facility-administered medications for this visit.   Facility-Administered Medications Ordered in Other Visits  Medication Dose Route Frequency Provider Last Rate Last Admin  . 0.9 %  sodium chloride infusion   Intravenous Continuous [name], MD 50 mL/hr at 03/07/14 1005 New Bag at 03/07/14 1005  . dextrose 5 % solution   Intravenous Continuous Alvy Bimler, Samuell Knoble, MD 10 mL/hr at 05/29/20 0900 New Bag at 05/29/20 0900  . Immune Globulin 10% (GAMUNEX-C) IV infusion 40 g  0.55 g/kg Intravenous Once Dniya Neuhaus, MD      . sodium chloride 0.9 % injection 10 mL  10 mL Intracatheter PRN Marcy Panning, MD  10 mL at 08/22/12 1721    PHYSICAL EXAMINATION: ECOG PERFORMANCE STATUS: 1 - Symptomatic but completely ambulatory  Vitals:   05/29/20 0826  BP: (!) 103/58  Pulse: 68  Resp: 18  Temp: (!) 97.4 F (36.3 C)  SpO2: 98%   Filed Weights   05/29/20 0826  Weight: 163 lb 12.8 oz (74.3 kg)    GENERAL:alert, no distress and comfortable SKIN: skin color, texture, turgor are normal, no rashes or significant lesions.  Noted skin bruises EYES: normal, Conjunctiva are pink and non-injected, sclera clear OROPHARYNX:no exudate, no erythema and lips, buccal mucosa, and tongue normal  NECK: supple, thyroid normal size, non-tender, without nodularity LYMPH:  no palpable lymphadenopathy in the cervical, axillary or inguinal LUNGS: clear to auscultation and percussion with normal breathing effort HEART: regular rate & rhythm and no murmurs and no lower extremity edema ABDOMEN:abdomen soft, non-tender and normal bowel  sounds Musculoskeletal:no cyanosis of digits and no clubbing  NEURO: alert & oriented x 3 with fluent speech, no focal motor/sensory deficits  LABORATORY DATA:  I have reviewed the data as listed    Component Value Date/Time   NA 140 05/29/2020 0813   NA 142 06/19/2019 0848   NA 136 01/26/2017 0940   K 4.6 05/29/2020 0813   K 4.0 01/26/2017 0940   CL 106 05/29/2020 0813   CL 104 08/03/2012 1229   CO2 26 05/29/2020 0813   CO2 23 01/26/2017 0940   GLUCOSE 123 (H) 05/29/2020 0813   GLUCOSE 146 (H) 01/26/2017 0940   GLUCOSE 223 (H) 08/03/2012 1229   BUN 12 05/29/2020 0813   BUN 13 06/19/2019 0848   BUN 9.6 01/26/2017 0940   CREATININE 0.73 05/29/2020 0813   CREATININE 1.04 04/24/2018 0821   CREATININE 1.0 01/26/2017 0940   CALCIUM 8.3 (L) 05/29/2020 0813   CALCIUM 8.7 01/26/2017 0940   PROT 5.7 (L) 05/29/2020 0813   PROT 5.9 (L) 04/13/2017 0823   PROT 5.7 (L) 01/26/2017 0940   ALBUMIN 3.5 05/29/2020 0813   ALBUMIN 3.6 01/26/2017 0940   AST 17 05/29/2020 0813   AST 13 (L) 04/24/2018 0821   AST 13 01/26/2017 0940   ALT 15 05/29/2020 0813   ALT 10 04/24/2018 0821   ALT 12 01/26/2017 0940   ALKPHOS 120 05/29/2020 0813   ALKPHOS 78 01/26/2017 0940   BILITOT 0.4 05/29/2020 0813   BILITOT 0.7 04/24/2018 0821   BILITOT 0.64 01/26/2017 0940   GFRNONAA >60 05/29/2020 0813   GFRNONAA >60 04/24/2018 0821   GFRAA >60 10/25/2019 0819   GFRAA >60 04/24/2018 0821    No results found for: SPEP, UPEP  Lab Results  Component Value Date   WBC 6.2 05/29/2020   NEUTROABS 3.6 05/29/2020   HGB 11.8 (L) 05/29/2020   HCT 37.3 (L) 05/29/2020   MCV 96.6 05/29/2020   PLT 82 (L) 05/29/2020      Chemistry      Component Value Date/Time   NA 140 05/29/2020 0813   NA 142 06/19/2019 0848   NA 136 01/26/2017 0940   K 4.6 05/29/2020 0813   K 4.0 01/26/2017 0940   CL 106 05/29/2020 0813   CL 104 08/03/2012 1229   CO2 26 05/29/2020 0813   CO2 23 01/26/2017 0940   BUN 12 05/29/2020  0813   BUN 13 06/19/2019 0848   BUN 9.6 01/26/2017 0940   CREATININE 0.73 05/29/2020 0813   CREATININE 1.04 04/24/2018 0821   CREATININE 1.0 01/26/2017 0940  Component Value Date/Time   CALCIUM 8.3 (L) 05/29/2020 0813   CALCIUM 8.7 01/26/2017 0940   ALKPHOS 120 05/29/2020 0813   ALKPHOS 78 01/26/2017 0940   AST 17 05/29/2020 0813   AST 13 (L) 04/24/2018 0821   AST 13 01/26/2017 0940   ALT 15 05/29/2020 0813   ALT 10 04/24/2018 0821   ALT 12 01/26/2017 0940   BILITOT 0.4 05/29/2020 0813   BILITOT 0.7 04/24/2018 0821   BILITOT 0.64 01/26/2017 0940

## 2020-05-29 NOTE — Assessment & Plan Note (Signed)
He has chronic diarrhea despite taking regular doses of Imodium He will take lomotil as needed

## 2020-06-02 ENCOUNTER — Other Ambulatory Visit: Payer: Self-pay | Admitting: Internal Medicine

## 2020-06-02 DIAGNOSIS — J301 Allergic rhinitis due to pollen: Secondary | ICD-10-CM

## 2020-06-03 ENCOUNTER — Other Ambulatory Visit (HOSPITAL_COMMUNITY): Payer: Self-pay

## 2020-06-03 MED FILL — Acalabrutinib Cap 100 MG: ORAL | 30 days supply | Qty: 60 | Fill #0 | Status: AC

## 2020-06-08 ENCOUNTER — Other Ambulatory Visit (HOSPITAL_COMMUNITY): Payer: Self-pay

## 2020-06-24 ENCOUNTER — Other Ambulatory Visit: Payer: Self-pay | Admitting: Internal Medicine

## 2020-06-24 DIAGNOSIS — R053 Chronic cough: Secondary | ICD-10-CM

## 2020-06-24 DIAGNOSIS — J301 Allergic rhinitis due to pollen: Secondary | ICD-10-CM

## 2020-06-24 NOTE — Telephone Encounter (Signed)
Please advise if you are okay with [place] refilling med.

## 2020-06-26 ENCOUNTER — Inpatient Hospital Stay (HOSPITAL_BASED_OUTPATIENT_CLINIC_OR_DEPARTMENT_OTHER): Payer: Medicare Other | Admitting: Hematology and Oncology

## 2020-06-26 ENCOUNTER — Inpatient Hospital Stay: Payer: Medicare Other | Attending: Hematology and Oncology

## 2020-06-26 ENCOUNTER — Other Ambulatory Visit: Payer: Self-pay

## 2020-06-26 ENCOUNTER — Encounter: Payer: Self-pay | Admitting: Hematology and Oncology

## 2020-06-26 ENCOUNTER — Other Ambulatory Visit: Payer: Self-pay | Admitting: Hematology and Oncology

## 2020-06-26 ENCOUNTER — Other Ambulatory Visit: Payer: Medicare Other

## 2020-06-26 ENCOUNTER — Telehealth: Payer: Self-pay | Admitting: Hematology and Oncology

## 2020-06-26 ENCOUNTER — Inpatient Hospital Stay: Payer: Medicare Other

## 2020-06-26 VITALS — BP 92/57 | HR 54 | Temp 98.2°F | Resp 18

## 2020-06-26 DIAGNOSIS — C9110 Chronic lymphocytic leukemia of B-cell type not having achieved remission: Secondary | ICD-10-CM

## 2020-06-26 DIAGNOSIS — D61818 Other pancytopenia: Secondary | ICD-10-CM | POA: Diagnosis not present

## 2020-06-26 DIAGNOSIS — D801 Nonfamilial hypogammaglobulinemia: Secondary | ICD-10-CM

## 2020-06-26 DIAGNOSIS — R197 Diarrhea, unspecified: Secondary | ICD-10-CM | POA: Diagnosis not present

## 2020-06-26 DIAGNOSIS — I259 Chronic ischemic heart disease, unspecified: Secondary | ICD-10-CM | POA: Diagnosis not present

## 2020-06-26 DIAGNOSIS — Z95828 Presence of other vascular implants and grafts: Secondary | ICD-10-CM

## 2020-06-26 DIAGNOSIS — C8308 Small cell B-cell lymphoma, lymph nodes of multiple sites: Secondary | ICD-10-CM

## 2020-06-26 LAB — CBC WITH DIFFERENTIAL/PLATELET
Abs Immature Granulocytes: 0.03 10*3/uL (ref 0.00–0.07)
Basophils Absolute: 0 10*3/uL (ref 0.0–0.1)
Basophils Relative: 0 %
Eosinophils Absolute: 0.1 10*3/uL (ref 0.0–0.5)
Eosinophils Relative: 1 %
HCT: 36.7 % — ABNORMAL LOW (ref 39.0–52.0)
Hemoglobin: 11.8 g/dL — ABNORMAL LOW (ref 13.0–17.0)
Immature Granulocytes: 1 %
Lymphocytes Relative: 44 %
Lymphs Abs: 2.7 10*3/uL (ref 0.7–4.0)
MCH: 30.6 pg (ref 26.0–34.0)
MCHC: 32.2 g/dL (ref 30.0–36.0)
MCV: 95.1 fL (ref 80.0–100.0)
Monocytes Absolute: 0.5 10*3/uL (ref 0.1–1.0)
Monocytes Relative: 7 %
Neutro Abs: 2.8 10*3/uL (ref 1.7–7.7)
Neutrophils Relative %: 47 %
Platelets: 83 10*3/uL — ABNORMAL LOW (ref 150–400)
RBC: 3.86 MIL/uL — ABNORMAL LOW (ref 4.22–5.81)
RDW: 17.1 % — ABNORMAL HIGH (ref 11.5–15.5)
WBC: 6.1 10*3/uL (ref 4.0–10.5)
nRBC: 0 % (ref 0.0–0.2)

## 2020-06-26 LAB — COMPREHENSIVE METABOLIC PANEL
ALT: 11 U/L (ref 0–44)
AST: 19 U/L (ref 15–41)
Albumin: 3.5 g/dL (ref 3.5–5.0)
Alkaline Phosphatase: 134 U/L — ABNORMAL HIGH (ref 38–126)
Anion gap: 8 (ref 5–15)
BUN: 17 mg/dL (ref 8–23)
CO2: 25 mmol/L (ref 22–32)
Calcium: 8.9 mg/dL (ref 8.9–10.3)
Chloride: 108 mmol/L (ref 98–111)
Creatinine, Ser: 0.8 mg/dL (ref 0.61–1.24)
GFR, Estimated: >60 mL/min (ref >60)
Glucose, Bld: 123 mg/dL — ABNORMAL HIGH (ref 70–99)
Potassium: 4.1 mmol/L (ref 3.5–5.1)
Sodium: 141 mmol/L (ref 135–145)
Total Bilirubin: 0.3 mg/dL (ref 0.3–1.2)
Total Protein: 6 g/dL — ABNORMAL LOW (ref 6.5–8.1)

## 2020-06-26 MED ORDER — DEXTROSE 5 % IV SOLN
INTRAVENOUS | Status: DC
  Filled 2020-06-26 (×2): qty 250

## 2020-06-26 MED ORDER — IMMUNE GLOBULIN (HUMAN) 10 GM/100ML IJ SOLN
40.0000 g | Freq: Once | INTRAMUSCULAR | Status: AC
Start: 2020-06-26 — End: 2020-06-26
  Administered 2020-06-26: 40 g via INTRAVENOUS
  Filled 2020-06-26: qty 400

## 2020-06-26 MED ORDER — DIPHENHYDRAMINE HCL 25 MG PO TABS
25.0000 mg | ORAL_TABLET | Freq: Once | ORAL | Status: AC
  Administered 2020-06-26: 25 mg via ORAL
  Filled 2020-06-26: qty 1

## 2020-06-26 MED ORDER — METHYLPREDNISOLONE SODIUM SUCC 40 MG IJ SOLR
40.0000 mg | Freq: Once | INTRAMUSCULAR | Status: AC
  Administered 2020-06-26: 40 mg via INTRAVENOUS

## 2020-06-26 MED ORDER — ACETAMINOPHEN 325 MG PO TABS
650.0000 mg | ORAL_TABLET | Freq: Once | ORAL | Status: AC
Start: 2020-06-26 — End: 2020-06-26
  Administered 2020-06-26: 650 mg via ORAL

## 2020-06-26 MED ORDER — METHYLPREDNISOLONE SODIUM SUCC 40 MG IJ SOLR
INTRAMUSCULAR | Status: AC
  Filled 2020-06-26: qty 1

## 2020-06-26 MED ORDER — ACETAMINOPHEN 325 MG PO TABS
ORAL_TABLET | ORAL | Status: AC
  Filled 2020-06-26: qty 2

## 2020-06-26 MED ORDER — METHYLPREDNISOLONE SODIUM SUCC 125 MG IJ SOLR: 40.0000 mg | Freq: Once | INTRAMUSCULAR | Status: DC

## 2020-06-26 MED ORDER — DIPHENHYDRAMINE HCL 25 MG PO CAPS
ORAL_CAPSULE | ORAL | Status: AC
  Filled 2020-06-26: qty 1

## 2020-06-26 MED ORDER — SODIUM CHLORIDE 0.9% FLUSH
10.0000 mL | Freq: Once | INTRAVENOUS | Status: AC
  Administered 2020-06-26: 10 mL
  Filled 2020-06-26: qty 10

## 2020-06-26 MED ORDER — HEPARIN SOD (PORK) LOCK FLUSH 100 UNIT/ML IV SOLN
500.0000 [IU] | Freq: Once | INTRAVENOUS | Status: AC
  Administered 2020-06-26: 500 [IU]
  Filled 2020-06-26: qty 5

## 2020-06-26 NOTE — Assessment & Plan Note (Addendum)
We discussed monthly IVIG for the next few months and he is in agreement to proceed

## 2020-06-26 NOTE — Patient Instructions (Signed)
Immune Globulin Injection What is this medicine? IMMUNE GLOBULIN (im MUNE GLOB yoo lin) helps to prevent or reduce the severity of certain infections in patients who are at risk. This medicine is collected from the pooled blood of many donors. It is used to treat immune system problems, thrombocytopenia, and Kawasaki syndrome. This medicine may be used for other purposes; ask your health care provider or pharmacist if you have questions. COMMON BRAND NAME(S): ASCENIV, Baygam, BIVIGAM, Carimune, Carimune NF, cutaquig, Cuvitru, Flebogamma, Flebogamma DIF, GamaSTAN, GamaSTAN S/D, Gamimune N, Gammagard, Gammagard S/D, Gammaked, Gammaplex, Gammar-P IV, Gamunex, Gamunex-C, Hizentra, Iveegam, Iveegam EN, Octagam, Panglobulin, Panglobulin NF, panzyga, Polygam S/D, Privigen, Sandoglobulin, Venoglobulin-S, Vigam, Vivaglobulin, Xembify What should I tell my health care provider before I take this medicine? They need to know if you have any of these conditions:  diabetes  extremely low or no immune antibodies in the blood  heart disease  history of blood clots  hyperprolinemia  infection in the blood, sepsis  kidney disease  recently received or scheduled to receive a vaccination  an unusual or allergic reaction to human immune globulin, albumin, maltose, sucrose, other medicines, foods, dyes, or preservatives  pregnant or trying to get pregnant  breast-feeding How should I use this medicine? This medicine is for injection into a muscle or infusion into a vein or skin. It is usually given by a health care professional in a hospital or clinic setting. In rare cases, some brands of this medicine might be given at home. You will be taught how to give this medicine. Use exactly as directed. Take your medicine at regular intervals. Do not take your medicine more often than directed. Talk to your pediatrician regarding the use of this medicine in children. While this drug may be prescribed for selected  conditions, precautions do apply. Overdosage: If you think you have taken too much of this medicine contact a poison control center or emergency room at once. NOTE: This medicine is only for you. Do not share this medicine with others. What if I miss a dose? It is important not to miss your dose. Call your doctor or health care professional if you are unable to keep an appointment. If you give yourself the medicine and you miss a dose, take it as soon as you can. If it is almost time for your next dose, take only that dose. Do not take double or extra doses. What may interact with this medicine?  aspirin and aspirin-like medicines  cisplatin  cyclosporine  medicines for infection like acyclovir, adefovir, amphotericin B, bacitracin, cidofovir, foscarnet, ganciclovir, gentamicin, pentamidine, vancomycin  NSAIDS, medicines for pain and inflammation, like ibuprofen or naproxen  pamidronate  vaccines  zoledronic acid This list may not describe all possible interactions. Give your health care provider a list of all the medicines, herbs, non-prescription drugs, or dietary supplements you use. Also tell them if you smoke, drink alcohol, or use illegal drugs. Some items may interact with your medicine. What should I watch for while using this medicine? Your condition will be monitored carefully while you are receiving this medicine. This medicine is made from pooled blood donations of many different people. It may be possible to pass an infection in this medicine. However, the donors are screened for infections and all products are tested for HIV and hepatitis. The medicine is treated to kill most or all bacteria and viruses. Talk to your doctor about the risks and benefits of this medicine. Do not have vaccinations for at least   14 days before, or until at least 3 months after receiving this medicine. What side effects may I notice from receiving this medicine? Side effects that you should report  to your doctor or health care professional as soon as possible:  allergic reactions like skin rash, itching or hives, swelling of the face, lips, or tongue  blue colored lips or skin  breathing problems  chest pain or tightness  fever  signs and symptoms of aseptic meningitis such as stiff neck; sensitivity to light; headache; drowsiness; fever; nausea; vomiting; rash  signs and symptoms of a blood clot such as chest pain; shortness of breath; pain, swelling, or warmth in the leg  signs and symptoms of hemolytic anemia such as fast heartbeat; tiredness; dark yellow or brown urine; or yellowing of the eyes or skin  signs and symptoms of kidney injury like trouble passing urine or change in the amount of urine  sudden weight gain  swelling of the ankles, feet, hands Side effects that usually do not require medical attention (report to your doctor or health care professional if they continue or are bothersome):  diarrhea  flushing  headache  increased sweating  joint pain  muscle cramps  muscle pain  nausea  pain, redness, or irritation at site where injected  tiredness This list may not describe all possible side effects. Call your doctor for medical advice about side effects. You may report side effects to FDA at 1-800-FDA-1088. Where should I keep my medicine? Keep out of the reach of children. This drug is usually given in a hospital or clinic and will not be stored at home. In rare cases, some brands of this medicine may be given at home. If you are using this medicine at home, you will be instructed on how to store this medicine. Throw away any unused medicine after the expiration date on the label. NOTE: This sheet is a summary. It may not cover all possible information. If you have questions about this medicine, talk to your doctor, pharmacist, or health care provider.  2021 Elsevier/Gold Standard (2018-09-05 12:51:14)  

## 2020-06-26 NOTE — Assessment & Plan Note (Signed)
He has chronic diarrhea despite taking regular doses of Imodium He will take lomotil as needed

## 2020-06-26 NOTE — Telephone Encounter (Signed)
Scheduled per los. Confirmed appt. Declined printout

## 2020-06-26 NOTE — Progress Notes (Signed)
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 [facility]) He tolerated Calquence well except for bruising and mild anemia We will continue treatment as scheduled I recommend IVIG treatment along with aggressive supportive care So far, he has no hospitalization since we started IVIG  Hypogammaglobulinemia, acquired We discussed monthly IVIG for the next few months and he is in agreement to proceed  Pancytopenia, acquired [facility]) He has multifactorial pancytopenia He is 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   All questions were answered. The patient knows to call the clinic with any problems, questions or concerns. The total time spent in the appointment was 20 minutes encounter with patients including review of chart and various tests results, discussions about plan of care and coordination of care plan   [name], MD 06/26/2020 9:55 AM  INTERVAL HISTORY: Please see below for problem oriented charting. 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  No evidence of aortic dissection or other  acute process in the chest. 2.  Centrilobular emphysema with a 5 mm right lung nodule. Given the concurrent centrilobular emphysema, follow-up chest CT at 6 -12 months is recommended.  3.  Coronary artery atherosclerosis which is age advanced. 4.  Prominent thoracic lymph nodes.  These can be reevaluated at follow-up.   05/06/2010 Imaging   1.  Multiple small periaortic lymph nodes consistent with the patient's history of the chronic lymphocytic leukemia. 2.  No evidence of solid organ involvement   11/03/2011 Imaging   1.  Interval progression of abdominal and pelvic adenopathy. 2.  Progression of splenomegaly.  The spleen now measures 23 cm in length   11/18/2011 Bone Marrow Biopsy   Bone Marrow, Aspirate,Biopsy, and Clot, right iliac bone - HYPERCELLULAR BONE MARROW WITH EXTENSIVE INVOLVEMENT BY CHRONIC LYMPHOCYTIC LEUKEMIA. PERIPHERAL BLOOD: - CHRONIC LYMPHOCYTIC LEUKEMIA   03/08/2012 Imaging   1.  Progressive increase in retroperitoneal, iliac, and inguinal lymphadenopathy. 2.  Interval increase in massive splenomegaly.    06/01/2012 Procedure   Placement of single lumen port a cath via right internal jugular vein.  The catheter tip lies at the cavoatrial junction.  A power injectable port a cath was placed and is ready for immediate use   06/20/2012 - 11/23/2012 Chemotherapy   He received FCR x 6 cycles   12/19/2012 Imaging   Left common iliac stent. Abdominal vasculature remains patent. Improving supraclavicular and axillary lymphadenopathy. Residual right subpectoral nodes measure up to 10 mm short axis. Improving retroperitoneal lymphadenopathy, measuring up to 16 mm short axis. Improving splenomegaly, measuring 18.7 cm.    01/20/2016 Imaging   1. Stable exam.  No new or progressive findings.  2. No CT findings to explain odynophagia   09/02/2016 Pathology Results   The findings are consistent with involvement by previously known chronic lymphocytic leukemia   09/02/2016  Pathology Results   FISH for CLL came back positive for deletion 13q   09/15/2016 Imaging   1. Borderline enlarged abdominal and pelvic lymph nodes. Compared with 11/03/2011 these are decreased in size as detailed above. 2. Persistent splenomegaly. 3. Aortic Atherosclerosis (ICD10-I70.0). LAD coronary artery calcification noted.   10/06/2016 - 02/24/2017 Chemotherapy   He received Bendamustine and Rituxan   11/03/2016 Adverse Reaction   Dose of Bendamustine is reduced due to severe pancytopenia   12/28/2016 Imaging   1. Borderline enlarged abdominal peritoneal ligament and abdominal retroperitoneal lymph nodes, stable. 2. Splenomegaly. 3.  Aortic atherosclerosis (ICD10-170.0).   03/22/2017 PET scan   1. No hypermetabolic adenopathy identified within the neck, chest, abdomen or pelvis. 2. Prominent left retroperitoneal node measures 1.6 cm without significant FDG uptake. 3. Splenomegaly. 4. Aortic Atherosclerosis (ICD10-I70.0) and Emphysema (ICD10-J43.9). LAD and left circumflex atherosclerotic calcifications noted.   02/02/2018 Procedure   IMPRESSION: Widely patent right IJ power port catheter.   04/06/2018 Imaging   1. Interval enlargement of axillary, mediastinal, and retroperitoneal lymph nodes, as well as increased splenomegaly, findings concerning for progression of CLL in comparison to prior PET-CT dated 03/22/2017.  2.  Other chronic and incidental findings as detailed above.    04/16/2018 -  Chemotherapy   The patient had acalabrutinib for chemo   04/17/2018 - 04/21/2018 Hospital Admission   He was hospitalized for influenza   11/29/2018 Imaging   1. Interval response to therapy. No thoracic added not scratch set no thoracic or pelvic adenopathy identified. Significant improvement in abdominal adenopathy. Largest remaining lymph node measures 1.3cm in the left retroperitoneal region. Previously 2.5 cm. 2. Persistent splenomegaly, improved from previous exam. 3. No new or  progressive disease identified within the chest, abdomen or pelvis. 4. Stable appearance of 5 mm right middle lobe lung nodule. 5. Aortic Atherosclerosis (ICD10-I70.0) and Emphysema (ICD10-J43.9). Coronary artery calcifications.       REVIEW OF SYSTEMS:   Constitutional: Denies fevers, chills or abnormal weight loss Eyes: Denies blurriness of vision Ears, nose, mouth, throat, and face: Denies mucositis or sore throat Respiratory: Denies cough, dyspnea or wheezes Cardiovascular: Denies palpitation, chest discomfort or lower extremity swelling Skin: Denies abnormal skin rashes Lymphatics: Denies new lymphadenopathy  Neurological:Denies numbness, tingling or new weaknesses Behavioral/Psych: Mood is stable, no new changes  All other systems were reviewed with the patient and are negative.  I have reviewed the past medical history, past surgical history, social history and family history with the patient and they are unchanged from previous note.  ALLERGIES:  is allergic to immune globulin and codeine.  MEDICATIONS:  Current Outpatient Medications  Medication Sig Dispense Refill  . acalabrutinib (CALQUENCE) 100 MG capsule TAKE 1 CAPSULE BY MOUTH TWICE A DAY (Patient taking differently: No sig reported) 60 capsule 11  . acetaminophen (TYLENOL) 325 MG tablet Take 2 tablets (650 mg total) by mouth every 6 (six) hours as needed for moderate pain or fever. 30 tablet 0  . acyclovir (ZOVIRAX) 400 MG tablet Take 1 tablet (400 mg total) by mouth 2 (two) times daily. 180 tablet 1  . allopurinol (ZYLOPRIM) 300 MG tablet TAKE 1 TABLET BY MOUTH EVERY DAY (Patient taking differently: For cll) 90 tablet 1  . ALPRAZolam (XANAX) 0.5 MG tablet Take 0.5 mg by mouth as needed.    Marland Kitchen  aspirin EC 81 MG tablet Take 81 mg by mouth daily. Swallow whole.    . b complex vitamins tablet Take 1 tablet by mouth daily.     . benzonatate (TESSALON) 100 MG capsule TAKE TWO CAPSULES BY MOUTH THREE TIMES A DAY AS NEEDED 30  capsule 2  . Cholecalciferol 25 MCG (1000 UT) tablet Take 1,000 Units by mouth daily.     . diphenoxylate-atropine (LOMOTIL) 2.5-0.025 MG tablet Take 1 tablet by mouth 4 (four) times daily as needed for diarrhea or loose stools. (Patient taking differently: Take 1 tablet by mouth 4 (four) times daily as needed for diarrhea or loose stools. Immodium prn) 60 tablet 1  . famotidine (PEPCID) 20 MG tablet TAKE 1 TABLET BY MOUTH TWICE A DAY (Patient taking differently: 2 (two) times daily as needed.) 180 tablet 1  . HYDROcodone-acetaminophen (NORCO) 10-325 MG tablet Take 1 tablet by mouth every 6 (six) hours as needed.    . lidocaine-prilocaine (EMLA) cream Apply 1 application topically as needed.    . loperamide (IMODIUM) 2 MG capsule Take 1 capsule (2 mg total) by mouth every 6 (six) hours as needed for diarrhea or loose stools. 60 capsule 2  . meclizine (ANTIVERT) 12.5 MG tablet Take 12.5 mg by mouth 3 (three) times daily as needed for dizziness.    . metFORMIN (GLUCOPHAGE-XR) 500 MG 24 hr tablet Take 500 mg by mouth every evening.    . midodrine (PROAMATINE) 5 MG tablet Take 1 tablet (5 mg total) by mouth 3 (three) times daily with meals. 270 tablet 3  . nitroGLYCERIN (NITROSTAT) 0.4 MG SL tablet PLACE 1 TABLET UNDER THE TONGUE EVERY 5 MINUTES X 3 DOSES AS NEEDED FOR CHEST PAIN *MAX 3 DOSES* 25 tablet 3  . pregabalin (LYRICA) 200 MG capsule TAKE 1 CAPSULE (200 MG TOTAL) BY MOUTH 3 (THREE) TIMES DAILY. 270 capsule 1  . rosuvastatin (CRESTOR) 5 MG tablet TAKE 1 TABLET BY MOUTH EVERY DAY (Patient taking differently: daily.) 90 tablet 3  . sildenafil (VIAGRA) 100 MG tablet Take 0.5 tablets (50 mg total) by mouth as needed for erectile dysfunction (30 mintues prior to sexual intercourse). 10 tablet 3  . tamsulosin (FLOMAX) 0.4 MG CAPS capsule Take 1 capsule (0.4 mg total) by mouth daily. (Patient taking differently: Take 0.4 mg by mouth daily after supper.) 30 capsule 0  . zolpidem (AMBIEN) 10 MG tablet Take  10 mg by mouth at bedtime as needed.     No current facility-administered medications for this visit.   Facility-Administered Medications Ordered in Other Visits  Medication Dose Route Frequency Provider Last Rate Last Admin  . 0.9 %  sodium chloride infusion   Intravenous Continuous [name], MD 50 mL/hr at 03/07/14 1005 New Bag at 03/07/14 1005  . dextrose 5 % solution   Intravenous Continuous [name], MD 20 mL/hr at 06/26/20 0917 New Bag at 06/26/20 0917  . Immune Globulin 10% (GAMUNEX-C) IV infusion 40 g  40 g Intravenous Once Stephan Draughn, MD      . sodium chloride 0.9 % injection 10 mL  10 mL Intracatheter PRN Marcy Panning, MD   10 mL at 08/22/12 1721    PHYSICAL EXAMINATION: ECOG PERFORMANCE STATUS: 1 - Symptomatic but completely ambulatory  Vitals:   06/26/20 0848  BP: (!) 95/57  Pulse: 63  Resp: 18  Temp: (!) 97.5 F (36.4 C)  SpO2: 100%   Filed Weights   06/26/20 0848  Weight: 161 lb 11.2 oz (73.3 kg)  GENERAL:alert, no distress and comfortable SKIN: Noted extensive skin bruises   EYES: normal, Conjunctiva are pink and non-injected, sclera clear OROPHARYNX:no exudate, no erythema and lips, buccal mucosa, and tongue normal  NECK: supple, thyroid normal size, non-tender, without nodularity LYMPH:  no palpable lymphadenopathy in the cervical, axillary or inguinal LUNGS: clear to auscultation and percussion with normal breathing effort HEART: regular rate & rhythm and no murmurs and no lower extremity edema ABDOMEN:abdomen soft, non-tender and normal bowel sounds Musculoskeletal:no cyanosis of digits and no clubbing  NEURO: alert & oriented x 3 with fluent speech, no focal motor/sensory deficits  LABORATORY DATA:  I have reviewed the data as listed    Component Value Date/Time   NA 141 06/26/2020 0804   NA 142 06/19/2019 0848   NA 136 01/26/2017 0940   K 4.1 06/26/2020 0804   K 4.0 01/26/2017 0940   CL 108 06/26/2020 0804   CL 104 08/03/2012 1229    CO2 25 06/26/2020 0804   CO2 23 01/26/2017 0940   GLUCOSE 123 (H) 06/26/2020 0804   GLUCOSE 146 (H) 01/26/2017 0940   GLUCOSE 223 (H) 08/03/2012 1229   BUN 17 06/26/2020 0804   BUN 13 06/19/2019 0848   BUN 9.6 01/26/2017 0940   CREATININE 0.80 06/26/2020 0804   CREATININE 1.04 04/24/2018 0821   CREATININE 1.0 01/26/2017 0940   CALCIUM 8.9 06/26/2020 0804   CALCIUM 8.7 01/26/2017 0940   PROT 6.0 (L) 06/26/2020 0804   PROT 5.9 (L) 04/13/2017 0823   PROT 5.7 (L) 01/26/2017 0940   ALBUMIN 3.5 06/26/2020 0804   ALBUMIN 3.6 01/26/2017 0940   AST 19 06/26/2020 0804   AST 13 (L) 04/24/2018 0821   AST 13 01/26/2017 0940   ALT 11 06/26/2020 0804   ALT 10 04/24/2018 0821   ALT 12 01/26/2017 0940   ALKPHOS 134 (H) 06/26/2020 0804   ALKPHOS 78 01/26/2017 0940   BILITOT 0.3 06/26/2020 0804   BILITOT 0.7 04/24/2018 0821   BILITOT 0.64 01/26/2017 0940   GFRNONAA >60 06/26/2020 0804   GFRNONAA >60 04/24/2018 0821   GFRAA >60 10/25/2019 0819   GFRAA >60 04/24/2018 0821    No results found for: SPEP, UPEP  Lab Results  Component Value Date   WBC 6.1 06/26/2020   NEUTROABS 2.8 06/26/2020   HGB 11.8 (L) 06/26/2020   HCT 36.7 (L) 06/26/2020   MCV 95.1 06/26/2020   PLT 83 (L) 06/26/2020      Chemistry      Component Value Date/Time   NA 141 06/26/2020 0804   NA 142 06/19/2019 0848   NA 136 01/26/2017 0940   K 4.1 06/26/2020 0804   K 4.0 01/26/2017 0940   CL 108 06/26/2020 0804   CL 104 08/03/2012 1229   CO2 25 06/26/2020 0804   CO2 23 01/26/2017 0940   BUN 17 06/26/2020 0804   BUN 13 06/19/2019 0848   BUN 9.6 01/26/2017 0940   CREATININE 0.80 06/26/2020 0804   CREATININE 1.04 04/24/2018 0821   CREATININE 1.0 01/26/2017 0940      Component Value Date/Time   CALCIUM 8.9 06/26/2020 0804   CALCIUM 8.7 01/26/2017 0940   ALKPHOS 134 (H) 06/26/2020 0804   ALKPHOS 78 01/26/2017 0940   AST 19 06/26/2020 0804   AST 13 (L) 04/24/2018 0821   AST 13 01/26/2017 0940   ALT 11  06/26/2020 0804   ALT 10 04/24/2018 0821   ALT 12 01/26/2017 0940   BILITOT 0.3 06/26/2020 0804  BILITOT 0.7 04/24/2018 0821   BILITOT 0.64 01/26/2017 0940

## 2020-06-26 NOTE — Assessment & Plan Note (Signed)
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

## 2020-06-26 NOTE — Assessment & Plan Note (Signed)
He tolerated Calquence well except for bruising and mild anemia We will continue treatment as scheduled I recommend IVIG treatment along with aggressive supportive care So far, he has no hospitalization since we started IVIG

## 2020-07-03 ENCOUNTER — Other Ambulatory Visit (HOSPITAL_COMMUNITY): Payer: Self-pay

## 2020-07-03 MED FILL — Acalabrutinib Cap 100 MG: ORAL | 30 days supply | Qty: 60 | Fill #1 | Status: AC

## 2020-07-04 ENCOUNTER — Other Ambulatory Visit (HOSPITAL_COMMUNITY): Payer: Self-pay

## 2020-07-05 NOTE — Progress Notes (Signed)
Cardiology Office Note:    Date:  07/07/2020   ID:  Ryan Copeland, DOB 13-Aug-1950, MRN [id number]  PCP:  [name]., PA-C   [facility] Providers Cardiologist:  None Cardiology APP:  Burtis Junes, NP (Inactive)     Referring MD: [name]., PA-C     History of Present Illness:    Ryan Copeland is a 70 y.o. male with a hx of CAD with history of NSTEMI in 2011 s/p PCI to LAD, tobacco abuse, PVD s.p iliac stenting, HTN, HLD, DMII, chronic thrombocytopenia with CLL and small cell lymphoma who was previously followed by Truitt Merle who now presents to clinic for follow-up.  Last saw [name] on 03/10/20 where he was doing better after recent admission for septic shock from urosepsis. Had indwelling catheter at that time.   Today, the patient states that he feels low energy and fatigued all the time. Continues to have indwelling urinary catheter which he was told he will need lifelong. No recurrent urinary tract infections. Has been taking midodrine 5mg  TID to keep his blood pressure up. No chest pain, SOB, LE edema, palpitations, fevers, chills, hematuria, dysuria. No lightheadedness, dizziness or syncope.  Prefers to be called Ryan Copeland.  Past Medical History:  Diagnosis Date  . Anxiety 01/30/2014  . Back injury    lower disc  . CAD (coronary artery disease)   . CLL (chronic lymphocytic leukemia) ([place]) 03/18/2011  . COPD (chronic obstructive pulmonary disease) ([place])   . DM type 2 (diabetes mellitus, type 2) ([place])   . ECRB (extensor carpi radialis brevis) tenosynovitis   . Foley catheter in place 11/2019   last changed 04-14-2020  . GERD (gastroesophageal reflux disease)    takes Nexium if needed  . History of blood transfusion 2015  . Hyperlipidemia   . Insomnia   . 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 MI, acute, non ST segment elevation ([place]) 06/28/2009   with stenting of the LAD  . Neuromuscular disorder ([place])     peripheral neuropathy both all toes  . Pneumonia 11/2019  . PVD (peripheral vascular disease) ([place])   . Thrombocytopenia ([place])   . Tobacco abuse     Past Surgical History:  Procedure Laterality Date  . ADENOIDECTOMY  1955  . CARDIAC CATHETERIZATION    . CARDIAC CATHETERIZATION N/A 09/26/2014   Procedure: Left Heart Cath and Coronary Angiography;  Surgeon: Peter M Martinique, MD;  Location: [place] [facility];  Service: Cardiovascular;  Laterality: N/A;  . carpel tunnel release Left 04-1989  . carpel tunnel release  Right 01-1989  . CHOLECYSTECTOMY  2007   lapa  . CORONARY STENT PLACEMENT  06/2009   stent to lad  . CYSTOSCOPY N/A 04/20/2020   Procedure: CYSTOSCOPY WITH SUPRA PUBIC PLACEMENT;  Surgeon: Janith Lima, MD;  Location: [facility];  Service: Urology;  Laterality: N/A;  ONLY NEEDS 30 MIN  . femoral stents  09/2014   iliac stent  . IR CV LINE INJECTION  08/18/2017  . IR CV LINE INJECTION  09/01/2017  . IR CV LINE INJECTION  02/02/2018  . LATERAL EPICONDYLE RELEASE Left 02/12/2014   Procedure: LEFT ELBOW DEBRIDEMENT WITH TENDON REPAIR ;  Surgeon: Lorn Junes, MD;  Location: [place];  Service: Orthopedics;  Laterality: Left;  . LEFT CAI STENT/PTA AND POPLITEAL ARTERY/TIBIAL THROMBECTOMY     . LEFT HEART CATHETERIZATION WITH CORONARY ANGIOGRAM N/A 08/26/2011  Procedure: LEFT HEART CATHETERIZATION WITH CORONARY ANGIOGRAM;  Surgeon: Peter M Martinique, MD;  Location[facility];  Service: Cardiovascular;  Laterality: N/A;  . PERIPHERAL VASCULAR CATHETERIZATION N/A 01/01/2015   Procedure: Abdominal Aortogram;  Surgeon: Conrad [place] Point, MD;  Location: [place] [facility];  Service: Cardiovascular;  Laterality: N/A;  . port a cath insertion  2014   right   . TARSAL TUNNEL RELEASE Bilateral 08-2007    Current Medications: Current Meds  Medication Sig  . acalabrutinib (CALQUENCE) 100 MG capsule TAKE 1 CAPSULE BY MOUTH TWICE A DAY  . acetaminophen (TYLENOL) 325 MG tablet Take  2 tablets (650 mg total) by mouth every 6 (six) hours as needed for moderate pain or fever.  Marland Kitchen acyclovir (ZOVIRAX) 400 MG tablet TAKE 1 TABLET BY MOUTH TWICE A DAY  . allopurinol (ZYLOPRIM) 300 MG tablet TAKE 1 TABLET BY MOUTH EVERY DAY  . ALPRAZolam (XANAX) 0.5 MG tablet Take 0.5 mg by mouth as needed.  Marland Kitchen aspirin EC 81 MG tablet Take 81 mg by mouth daily. Swallow whole.  . b complex vitamins tablet Take 1 tablet by mouth daily.   . benzonatate (TESSALON) 100 MG capsule TAKE TWO CAPSULES BY MOUTH THREE TIMES A DAY AS NEEDED  . Cholecalciferol 25 MCG (1000 UT) tablet Take 1,000 Units by mouth daily.   . diphenoxylate-atropine (LOMOTIL) 2.5-0.025 MG tablet Take 1 tablet by mouth 4 (four) times daily as needed for diarrhea or loose stools.  . famotidine (PEPCID) 20 MG tablet TAKE 1 TABLET BY MOUTH TWICE A DAY  . HYDROcodone-acetaminophen (NORCO) 10-325 MG tablet Take 1 tablet by mouth every 6 (six) hours as needed.  . lidocaine-prilocaine (EMLA) cream Apply 1 application topically as needed.  . loperamide (IMODIUM) 2 MG capsule Take 1 capsule (2 mg total) by mouth every 6 (six) hours as needed for diarrhea or loose stools.  . meclizine (ANTIVERT) 12.5 MG tablet Take 12.5 mg by mouth 3 (three) times daily as needed for dizziness.  . metFORMIN (GLUCOPHAGE-XR) 500 MG 24 hr tablet Take 500 mg by mouth every evening.  . midodrine (PROAMATINE) 5 MG tablet Take 1.5 tablets (7.5 mg total) by mouth 3 (three) times daily with meals.  . nitroGLYCERIN (NITROSTAT) 0.4 MG SL tablet PLACE 1 TABLET UNDER THE TONGUE EVERY 5 MINUTES X 3 DOSES AS NEEDED FOR CHEST PAIN *MAX 3 DOSES*  . pregabalin (LYRICA) 200 MG capsule TAKE 1 CAPSULE (200 MG TOTAL) BY MOUTH 3 (THREE) TIMES DAILY.  . rosuvastatin (CRESTOR) 5 MG tablet TAKE 1 TABLET BY MOUTH EVERY DAY  . sildenafil (VIAGRA) 100 MG tablet Take 0.5 tablets (50 mg total) by mouth as needed for erectile dysfunction (30 mintues prior to sexual intercourse).  . tamsulosin  (FLOMAX) 0.4 MG CAPS capsule Take 1 capsule (0.4 mg total) by mouth daily.  Marland Kitchen zolpidem (AMBIEN) 10 MG tablet Take 10 mg by mouth at bedtime as needed.  . [DISCONTINUED] midodrine (PROAMATINE) 5 MG tablet Take 1 tablet (5 mg total) by mouth 3 (three) times daily with meals.     Allergies:   Immune globulin and Codeine   Social History   Socioeconomic History  . Marital status: Married    Spouse name: [name]  . Number of children: 1  . Years of education: College  . Highest education level: Not on file  Occupational History    Employer: [employer]    Comment: [employer]  Tobacco Use  . Smoking status: Former Smoker    Packs/day:  1.00    Years: 44.00    Pack years: 44.00    Types: Cigarettes    Quit date: 04/23/2016    Years since quitting: 4.2  . Smokeless tobacco: Former Network engineer  . Vaping Use: Never used  Substance and Sexual Activity  . Alcohol use: Not Currently    Alcohol/week: 0.0 standard drinks  . Drug use: No  . Sexual activity: Yes  Other Topics Concern  . Not on file  Social History Narrative   Patient lives at home with wife.   Caffeine Use: 15 cups of caffeine weekly   Social Determinants of Health   Financial Resource Strain: Not on file  Food Insecurity: No Food Insecurity  . Worried About Charity fundraiser in the Last Year: Never true  . Ran Out of Food in the Last Year: Never true  Transportation Needs: No Transportation Needs  . Lack of Transportation (Medical): No  . Lack of Transportation (Non-Medical): No  Physical Activity: Not on file  Stress: Not on file  Social Connections: Not on file     Family History: The patient's family history includes Cancer in his mother; Heart disease in his father; Heart failure (age of onset: 21) in his father; Lung cancer (age of onset: 86) in his mother.  ROS:   Please see the history of present illness.    Review of Systems  Constitutional: Positive for malaise/fatigue. Negative for  chills and fever.  HENT: Negative for sore throat.   Eyes: Negative for blurred vision.  Respiratory: Negative for shortness of breath.   Cardiovascular: Negative for chest pain, palpitations, orthopnea, claudication, leg swelling and PND.  Gastrointestinal: Negative for nausea and vomiting.  Genitourinary: Negative for dysuria and hematuria.  Musculoskeletal: Negative for falls.  Neurological: Negative for dizziness and loss of consciousness.  Endo/Heme/Allergies: Negative for polydipsia.  Psychiatric/Behavioral: Negative for substance abuse.    EKGs/Labs/Other Studies Reviewed:    The following studies were reviewed today: ECHO IMPRESSIONS 12/2019 1. Left ventricular ejection fraction, by estimation, is 60 to 65%. The  left ventricle has normal function. The left ventricle has no regional  wall motion abnormalities. Left ventricular diastolic parameters are  consistent with Grade I diastolic  dysfunction (impaired relaxation).  2. Right ventricular systolic function is normal. The right ventricular  size is normal. There is mildly elevated pulmonary artery systolic  pressure. The estimated right ventricular systolic pressure is 92.1 mmHg.  3. Left atrial size was moderately dilated.  4. The mitral valve is abnormal. Mild mitral valve regurgitation.  5. The aortic valve is tricuspid. Aortic valve regurgitation is not  visualized.  6. Aortic dilatation noted. There is mild dilatation of the aortic root,  measuring 39 mm.  7. The inferior vena cava is dilated in size with <50% respiratory  variability, suggesting right atrial pressure of 15 mmHg.    Coronary angiography from 09/2014:  Coronary dominance: right  Left mainstem: Normal.  Left anterior descending (LAD): The stent in the proximal LAD is widely patent throughout. There is moderate 20% narrowing in the LAD proximal to the stent. The remainder of the vessels without significant disease.  Left circumflex  (LCx): The left circumflex gives rise to 2 large marginal branches. There is 20% narrowing prior to the takeoff of the first OM.  Right coronary artery (RCA): The right coronary is a dominant vessel. It has diffuse 70% disease in the proximal to mid vessel. Is occluded at the crux. There are excellent right  to right and left to right collaterals.  Left ventriculography: Left ventricular systolic function is normal, LVEF is estimated at 55-65%, there is no significant mitral regurgitation  Final Conclusions:  1. Single vessel obstructive coronary disease. 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 The stent in the proximal LAD is widely patent.  2. Normal LV function.  Recommendations: Continue medical management.  Peter Martinique  08/26/2011, 9:09 AM  EKG:  No new ECG obtained today  Recent Labs: 12/20/2019: Magnesium 2.0 01/01/2020: TSH 1.202 02/15/2020: B Natriuretic Peptide 146.4 06/26/2020: ALT 11; BUN 17; Creatinine, Ser 0.80; Hemoglobin 11.8; Platelets 83; Potassium 4.1; Sodium 141  Recent Lipid Panel    Component Value Date/Time   CHOL 79 (L) 06/19/2019 0848   TRIG 44 06/19/2019 0848   HDL 34 (L) 06/19/2019 0848   CHOLHDL 2.3 06/19/2019 0848   CHOLHDL 3.5 10/30/2015 0842   VLDL 25 10/30/2015 0842   LDLCALC 33 06/19/2019 0848   LDLDIRECT 117.6 08/22/2011 0904     Risk Assessment/Calculations:       Physical Exam:    VS:  BP (!) 100/58   Pulse 62   Ht 6' (1.829 m)   Wt 160 lb (72.6 kg)   SpO2 99%   BMI 21.70 kg/m     Wt Readings from Last 3 Encounters:  07/07/20 160 lb (72.6 kg)  06/26/20 161 lb 11.2 oz (73.3 kg)  05/29/20 163 lb 12.8 oz (74.3 kg)     GEN:  Comfortable, scattered ecchymosis on bilateral UE HEENT: Normal NECK: No JVD; No carotid bruits CARDIAC: RRR, 1/6 systolic murmur. No rubs, gallops RESPIRATORY:  Clear to auscultation without rales, wheezing or rhonchi  ABDOMEN: Soft, non-tender,  non-distended MUSCULOSKELETAL:  No edema; No deformity  SKIN: Warm and dry NEUROLOGIC:  Alert and oriented x 3 PSYCHIATRIC:  Normal affect   ASSESSMENT:    1. Coronary artery disease involving native coronary artery of native heart without angina pectoris   2. Essential hypertension   3. PVD (peripheral vascular disease) ([place])   4. Orthostatic hypotension   5. Pure hypercholesterolemia   6. Aortic root dilation (HCC)    PLAN:    In order of problems listed above:  #Known CAD s/p PCI to LAD in 2011: Last cath in 2016 with CTP of RCA with good collateral flow, patent pLAD stent. No current anginal symptoms. -Continue ASA 81mg  daily -Continue  5mg  daily; LDL very well controlled at 33  #PAD: S/p left common iliac artery stent and left lower extremity thromboembolectomy for embolization.  He had subsequent balloon angioplasty of the stent for in-stent stenosis. Follows with Dr. Donzetta Matters with vascular. -Follow-up with vascular surgery as needed -Continue ASA 81mg  daily -Continue  5mg  daily; LDL very well controlled at 33  #Hypotension #History of HTN: Now with hypotension. Currently on midodrine. -Increase midodine to 7.5mg  TID  #COPD: -Follow-up with Pulm as scheduled  #History of Urosepsis with indwelling catheter: -Follow-up with Urology  #HLD: LDL very well controlled with LDL 33. 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 -Follow-up with Heme-Onc  #Aortic Root Dilation: Measures 20mm on TTE 12/2019. -Serial monitoring with CTA/TTE or MRA    Medication Adjustments/Labs and Tests Ordered: Current medicines are reviewed at length with the patient today.  Concerns regarding medicines are outlined above.  No orders of the defined types were placed  in this encounter.  Meds ordered this encounter  Medications  . midodrine  (PROAMATINE) 5 MG tablet    Sig: Take 1.5 tablets (7.5 mg total) by mouth 3 (three) times daily with meals.    Dispense:  135 tablet    Refill:  1    Dose increase    Patient Instructions  Medication Instructions:   INCREASE YOUR MIDODRINE TO 7.5 MG BY MOUTH THREE TIMES DAILY  *If you need a refill on your cardiac medications before your next appointment, please call your pharmacy*   Follow-Up: At [facility], you and your health needs are our priority.  As part of our continuing mission to provide you with exceptional heart care, we have created designated Provider Care Teams.  These Care Teams include your primary Cardiologist (physician) and Advanced Practice Providers (APPs -  Physician Assistants and Nurse Practitioners) who all work together to provide you with the care you need, when you need it.  We recommend signing up for the patient portal called "MyChart".  Sign up information is provided on this After Visit Summary.  MyChart is used to connect with patients for Virtual Visits (Telemedicine).  Patients are able to view lab/test results, encounter notes, upcoming appointments, etc.  Non-urgent messages can be sent to your provider as well.   To learn more about what you can do with MyChart, go to [email or web address].    Your next appointment:   8 month(s)  The format for your next appointment:   In Person  Provider:   Gwyndolyn Kaufman, MD       Signed, Freada Bergeron, MD  07/07/2020 4:51 PM    [place]

## 2020-07-06 ENCOUNTER — Other Ambulatory Visit (HOSPITAL_COMMUNITY): Payer: Self-pay

## 2020-07-07 ENCOUNTER — Other Ambulatory Visit: Payer: Self-pay

## 2020-07-07 ENCOUNTER — Ambulatory Visit (INDEPENDENT_AMBULATORY_CARE_PROVIDER_SITE_OTHER): Payer: Medicare Other | Admitting: Cardiology

## 2020-07-07 ENCOUNTER — Encounter: Payer: Self-pay | Admitting: Cardiology

## 2020-07-07 VITALS — BP 100/58 | HR 62 | Ht 72.0 in | Wt 160.0 lb

## 2020-07-07 DIAGNOSIS — I739 Peripheral vascular disease, unspecified: Secondary | ICD-10-CM

## 2020-07-07 DIAGNOSIS — I10 Essential (primary) hypertension: Secondary | ICD-10-CM

## 2020-07-07 DIAGNOSIS — I951 Orthostatic hypotension: Secondary | ICD-10-CM

## 2020-07-07 DIAGNOSIS — I2510 Atherosclerotic heart disease of native coronary artery without angina pectoris: Secondary | ICD-10-CM | POA: Diagnosis not present

## 2020-07-07 DIAGNOSIS — I77810 Thoracic aortic ectasia: Secondary | ICD-10-CM

## 2020-07-07 DIAGNOSIS — E7800 Pure hypercholesterolemia, unspecified: Secondary | ICD-10-CM

## 2020-07-07 MED ORDER — MIDODRINE HCL 5 MG PO TABS: 7.5000 mg | ORAL_TABLET | Freq: Three times a day (TID) | ORAL | 1 refills | Status: DC

## 2020-07-07 NOTE — Patient Instructions (Signed)
Medication Instructions:   INCREASE YOUR MIDODRINE TO 7.5 MG BY MOUTH THREE TIMES DAILY  *If you need a refill on your cardiac medications before your next appointment, please call your pharmacy*   Follow-Up: At [facility], you and your health needs are our priority.  As part of our continuing mission to provide you with exceptional heart care, we have created designated Provider Care Teams.  These Care Teams include your primary Cardiologist (physician) and Advanced Practice Providers (APPs -  Physician Assistants and Nurse Practitioners) who all work together to provide you with the care you need, when you need it.  We recommend signing up for the patient portal called "MyChart".  Sign up information is provided on this After Visit Summary.  MyChart is used to connect with patients for Virtual Visits (Telemedicine).  Patients are able to view lab/test results, encounter notes, upcoming appointments, etc.  Non-urgent messages can be sent to your provider as well.   To learn more about what you can do with MyChart, go to [email or web address].    Your next appointment:   8 month(s)  The format for your next appointment:   In Person  Provider:   Gwyndolyn Kaufman, MD

## 2020-07-08 MED ORDER — MIDODRINE HCL 5 MG PO TABS
7.5000 mg | ORAL_TABLET | Freq: Three times a day (TID) | ORAL | 1 refills | Status: DC
Start: 2020-07-08 — End: 2020-11-06

## 2020-07-09 ENCOUNTER — Other Ambulatory Visit: Payer: Self-pay

## 2020-07-09 ENCOUNTER — Other Ambulatory Visit: Payer: Self-pay | Admitting: Internal Medicine

## 2020-07-09 DIAGNOSIS — I2510 Atherosclerotic heart disease of native coronary artery without angina pectoris: Secondary | ICD-10-CM

## 2020-07-09 DIAGNOSIS — J301 Allergic rhinitis due to pollen: Secondary | ICD-10-CM

## 2020-07-09 DIAGNOSIS — E7800 Pure hypercholesterolemia, unspecified: Secondary | ICD-10-CM

## 2020-07-09 MED ORDER — ROSUVASTATIN CALCIUM 5 MG PO TABS: 5.0000 mg | ORAL_TABLET | Freq: Every day | ORAL | 3 refills | Status: DC

## 2020-07-13 ENCOUNTER — Other Ambulatory Visit: Payer: Self-pay | Admitting: Internal Medicine

## 2020-07-13 DIAGNOSIS — J301 Allergic rhinitis due to pollen: Secondary | ICD-10-CM

## 2020-07-18 ENCOUNTER — Inpatient Hospital Stay (HOSPITAL_COMMUNITY)
Admission: EM | Admit: 2020-07-18 | Discharge: 2020-07-20 | DRG: 699 | Disposition: A | Discharge status: Home / Self Care | Payer: Medicare Other | Attending: Internal Medicine | Admitting: Internal Medicine

## 2020-07-18 ENCOUNTER — Emergency Department (HOSPITAL_COMMUNITY): Payer: Medicare Other

## 2020-07-18 ENCOUNTER — Other Ambulatory Visit: Payer: Self-pay

## 2020-07-18 ENCOUNTER — Encounter (HOSPITAL_COMMUNITY): Payer: Self-pay | Admitting: Internal Medicine

## 2020-07-18 DIAGNOSIS — N3000 Acute cystitis without hematuria: Secondary | ICD-10-CM

## 2020-07-18 DIAGNOSIS — T83518A Infection and inflammatory reaction due to other urinary catheter, initial encounter: Secondary | ICD-10-CM | POA: Diagnosis not present

## 2020-07-18 DIAGNOSIS — I739 Peripheral vascular disease, unspecified: Secondary | ICD-10-CM | POA: Diagnosis present

## 2020-07-18 DIAGNOSIS — F419 Anxiety disorder, unspecified: Secondary | ICD-10-CM | POA: Diagnosis present

## 2020-07-18 DIAGNOSIS — Z66 Do not resuscitate: Secondary | ICD-10-CM | POA: Diagnosis present

## 2020-07-18 DIAGNOSIS — B965 Pseudomonas (aeruginosa) (mallei) (pseudomallei) as the cause of diseases classified elsewhere: Secondary | ICD-10-CM | POA: Diagnosis present

## 2020-07-18 DIAGNOSIS — I252 Old myocardial infarction: Secondary | ICD-10-CM | POA: Diagnosis not present

## 2020-07-18 DIAGNOSIS — D61818 Other pancytopenia: Secondary | ICD-10-CM | POA: Diagnosis present

## 2020-07-18 DIAGNOSIS — I5032 Chronic diastolic (congestive) heart failure: Secondary | ICD-10-CM | POA: Diagnosis present

## 2020-07-18 DIAGNOSIS — Z79899 Other long term (current) drug therapy: Secondary | ICD-10-CM

## 2020-07-18 DIAGNOSIS — Z20822 Contact with and (suspected) exposure to covid-19: Secondary | ICD-10-CM | POA: Diagnosis present

## 2020-07-18 DIAGNOSIS — Z87891 Personal history of nicotine dependence: Secondary | ICD-10-CM

## 2020-07-18 DIAGNOSIS — Z7984 Long term (current) use of oral hypoglycemic drugs: Secondary | ICD-10-CM | POA: Diagnosis not present

## 2020-07-18 DIAGNOSIS — R8271 Bacteriuria: Secondary | ICD-10-CM | POA: Diagnosis not present

## 2020-07-18 DIAGNOSIS — Z885 Allergy status to narcotic agent status: Secondary | ICD-10-CM

## 2020-07-18 DIAGNOSIS — I959 Hypotension, unspecified: Secondary | ICD-10-CM | POA: Diagnosis not present

## 2020-07-18 DIAGNOSIS — E119 Type 2 diabetes mellitus without complications: Secondary | ICD-10-CM | POA: Diagnosis not present

## 2020-07-18 DIAGNOSIS — Y846 Urinary catheterization as the cause of abnormal reaction of the patient, or of later complication, without mention of misadventure at the time of the procedure: Secondary | ICD-10-CM | POA: Diagnosis present

## 2020-07-18 DIAGNOSIS — R338 Other retention of urine: Secondary | ICD-10-CM | POA: Diagnosis present

## 2020-07-18 DIAGNOSIS — Z8249 Family history of ischemic heart disease and other diseases of the circulatory system: Secondary | ICD-10-CM

## 2020-07-18 DIAGNOSIS — Z7982 Long term (current) use of aspirin: Secondary | ICD-10-CM

## 2020-07-18 DIAGNOSIS — N390 Urinary tract infection, site not specified: Secondary | ICD-10-CM | POA: Diagnosis present

## 2020-07-18 DIAGNOSIS — N401 Enlarged prostate with lower urinary tract symptoms: Secondary | ICD-10-CM | POA: Diagnosis present

## 2020-07-18 DIAGNOSIS — Z801 Family history of malignant neoplasm of trachea, bronchus and lung: Secondary | ICD-10-CM

## 2020-07-18 DIAGNOSIS — I2510 Atherosclerotic heart disease of native coronary artery without angina pectoris: Secondary | ICD-10-CM | POA: Diagnosis present

## 2020-07-18 DIAGNOSIS — E1151 Type 2 diabetes mellitus with diabetic peripheral angiopathy without gangrene: Secondary | ICD-10-CM | POA: Diagnosis present

## 2020-07-18 DIAGNOSIS — Z888 Allergy status to other drugs, medicaments and biological substances status: Secondary | ICD-10-CM

## 2020-07-18 DIAGNOSIS — Z955 Presence of coronary angioplasty implant and graft: Secondary | ICD-10-CM | POA: Diagnosis not present

## 2020-07-18 DIAGNOSIS — I509 Heart failure, unspecified: Secondary | ICD-10-CM | POA: Diagnosis not present

## 2020-07-18 DIAGNOSIS — J449 Chronic obstructive pulmonary disease, unspecified: Secondary | ICD-10-CM | POA: Diagnosis present

## 2020-07-18 DIAGNOSIS — G8929 Other chronic pain: Secondary | ICD-10-CM | POA: Diagnosis present

## 2020-07-18 DIAGNOSIS — C8308 Small cell B-cell lymphoma, lymph nodes of multiple sites: Secondary | ICD-10-CM | POA: Diagnosis present

## 2020-07-18 DIAGNOSIS — I9589 Other hypotension: Secondary | ICD-10-CM | POA: Diagnosis not present

## 2020-07-18 DIAGNOSIS — R1310 Dysphagia, unspecified: Secondary | ICD-10-CM | POA: Diagnosis present

## 2020-07-18 DIAGNOSIS — E785 Hyperlipidemia, unspecified: Secondary | ICD-10-CM | POA: Diagnosis present

## 2020-07-18 LAB — GLUCOSE, CAPILLARY
Glucose-Capillary: 85 mg/dL (ref 70–99)
Glucose-Capillary: 114 mg/dL — ABNORMAL HIGH (ref 70–99)
Glucose-Capillary: 89 mg/dL (ref 70–99)

## 2020-07-18 LAB — LACTIC ACID, PLASMA: Lactic Acid, Venous: 1.1 mmol/L (ref 0.5–1.9)

## 2020-07-18 LAB — URINALYSIS, ROUTINE W REFLEX MICROSCOPIC
Bilirubin Urine: NEGATIVE
Glucose, UA: NEGATIVE mg/dL
Ketones, ur: NEGATIVE mg/dL
Nitrite: POSITIVE — AB
Protein, ur: NEGATIVE mg/dL
Specific Gravity, Urine: 1.009 (ref 1.005–1.030)
pH: 6 (ref 5.0–8.0)

## 2020-07-18 LAB — CBC WITH DIFFERENTIAL/PLATELET
Abs Immature Granulocytes: 0.06 10*3/uL (ref 0.00–0.07)
Basophils Absolute: 0 10*3/uL (ref 0.0–0.1)
Basophils Relative: 0 %
Eosinophils Absolute: 0.1 10*3/uL (ref 0.0–0.5)
Eosinophils Relative: 1 %
HCT: 37 % — ABNORMAL LOW (ref 39.0–52.0)
Hemoglobin: 11.7 g/dL — ABNORMAL LOW (ref 13.0–17.0)
Immature Granulocytes: 1 %
Lymphocytes Relative: 35 %
Lymphs Abs: 2.2 10*3/uL (ref 0.7–4.0)
MCH: 30.7 pg (ref 26.0–34.0)
MCHC: 31.6 g/dL (ref 30.0–36.0)
MCV: 97.1 fL (ref 80.0–100.0)
Monocytes Absolute: 0.5 10*3/uL (ref 0.1–1.0)
Monocytes Relative: 8 %
Neutro Abs: 3.3 10*3/uL (ref 1.7–7.7)
Neutrophils Relative %: 55 %
Platelets: 76 10*3/uL — ABNORMAL LOW (ref 150–400)
RBC: 3.81 MIL/uL — ABNORMAL LOW (ref 4.22–5.81)
RDW: 18.4 % — ABNORMAL HIGH (ref 11.5–15.5)
WBC: 6.2 10*3/uL (ref 4.0–10.5)
nRBC: 0 % (ref 0.0–0.2)

## 2020-07-18 LAB — RESP PANEL BY RT-PCR (FLU A&B, COVID) ARPGX2
Influenza A by PCR: NEGATIVE
Influenza B by PCR: NEGATIVE
SARS Coronavirus 2 by RT PCR: NEGATIVE

## 2020-07-18 LAB — I-STAT VENOUS BLOOD GAS, ED
Acid-base deficit: 1 mmol/L (ref 0.0–2.0)
Bicarbonate: 23.3 mmol/L (ref 20.0–28.0)
Calcium, Ion: 1.07 mmol/L — ABNORMAL LOW (ref 1.15–1.40)
HCT: 42 % (ref 39.0–52.0)
Hemoglobin: 14.3 g/dL (ref 13.0–17.0)
O2 Saturation: 48 %
Potassium: 4.8 mmol/L (ref 3.5–5.1)
Sodium: 139 mmol/L (ref 135–145)
TCO2: 24 mmol/L (ref 22–32)
pCO2, Ven: 38.3 mmHg — ABNORMAL LOW (ref 44.0–60.0)
pH, Ven: 7.393 (ref 7.250–7.430)
pO2, Ven: 26 mmHg — CL (ref 32.0–45.0)

## 2020-07-18 LAB — COMPREHENSIVE METABOLIC PANEL
ALT: 14 U/L (ref 0–44)
AST: 14 U/L — ABNORMAL LOW (ref 15–41)
Albumin: 3.2 g/dL — ABNORMAL LOW (ref 3.5–5.0)
Alkaline Phosphatase: 101 U/L (ref 38–126)
Anion gap: 10 (ref 5–15)
BUN: 10 mg/dL (ref 8–23)
CO2: 23 mmol/L (ref 22–32)
Calcium: 8.8 mg/dL — ABNORMAL LOW (ref 8.9–10.3)
Chloride: 107 mmol/L (ref 98–111)
Creatinine, Ser: 0.77 mg/dL (ref 0.61–1.24)
GFR, Estimated: >60 mL/min (ref >60)
Glucose, Bld: 75 mg/dL (ref 70–99)
Potassium: 4.2 mmol/L (ref 3.5–5.1)
Sodium: 140 mmol/L (ref 135–145)
Total Bilirubin: 0.6 mg/dL (ref 0.3–1.2)
Total Protein: 5.4 g/dL — ABNORMAL LOW (ref 6.5–8.1)

## 2020-07-18 LAB — AMMONIA: Ammonia: 27 umol/L (ref 9–35)

## 2020-07-18 LAB — CBG MONITORING, ED: Glucose-Capillary: 96 mg/dL (ref 70–99)

## 2020-07-18 LAB — ETHANOL: Alcohol, Ethyl (B): <10 mg/dL (ref <10)

## 2020-07-18 MED ORDER — POLYETHYLENE GLYCOL 3350 17 G PO PACK: 17.0000 g | PACK | Freq: Every day | ORAL | Status: DC | PRN

## 2020-07-18 MED ORDER — SODIUM CHLORIDE 0.9 % IV BOLUS
1000.0000 mL | Freq: Once | INTRAVENOUS | Status: AC
  Administered 2020-07-18: 1000 mL via INTRAVENOUS

## 2020-07-18 MED ORDER — ALBUTEROL SULFATE (2.5 MG/3ML) 0.083% IN NEBU: 2.5000 mg | INHALATION_SOLUTION | RESPIRATORY_TRACT | Status: DC | PRN

## 2020-07-18 MED ORDER — BENZONATATE 100 MG PO CAPS: 200.0000 mg | ORAL_CAPSULE | Freq: Three times a day (TID) | ORAL | Status: DC | PRN

## 2020-07-18 MED ORDER — PREGABALIN 100 MG PO CAPS
200.0000 mg | ORAL_CAPSULE | Freq: Three times a day (TID) | ORAL | Status: DC
  Administered 2020-07-18 – 2020-07-20 (×6): 200 mg via ORAL
  Filled 2020-07-18 (×6): qty 2

## 2020-07-18 MED ORDER — BISACODYL 5 MG PO TBEC: 5.0000 mg | DELAYED_RELEASE_TABLET | Freq: Every day | ORAL | Status: DC | PRN

## 2020-07-18 MED ORDER — LACTATED RINGERS IV SOLN: INTRAVENOUS | Status: DC

## 2020-07-18 MED ORDER — HYDRALAZINE HCL 20 MG/ML IJ SOLN: 5.0000 mg | INTRAMUSCULAR | Status: DC | PRN

## 2020-07-18 MED ORDER — ALPRAZOLAM 0.5 MG PO TABS: 0.5000 mg | ORAL_TABLET | Freq: Two times a day (BID) | ORAL | Status: DC | PRN

## 2020-07-18 MED ORDER — FLUCONAZOLE 100 MG PO TABS
200.0000 mg | ORAL_TABLET | Freq: Every day | ORAL | Status: DC
  Administered 2020-07-18 – 2020-07-20 (×3): 200 mg via ORAL
  Filled 2020-07-18 (×3): qty 2

## 2020-07-18 MED ORDER — SODIUM CHLORIDE 0.9 % IV SOLN
1.0000 g | Freq: Three times a day (TID) | INTRAVENOUS | Status: DC
  Administered 2020-07-18 – 2020-07-20 (×7): 1 g via INTRAVENOUS
  Filled 2020-07-18 (×10): qty 1

## 2020-07-18 MED ORDER — HYDROCODONE-ACETAMINOPHEN 10-325 MG PO TABS
1.0000 | ORAL_TABLET | Freq: Four times a day (QID) | ORAL | Status: DC | PRN
  Administered 2020-07-20: 1 via ORAL
  Filled 2020-07-18: qty 1

## 2020-07-18 MED ORDER — ASPIRIN EC 81 MG PO TBEC
81.0000 mg | DELAYED_RELEASE_TABLET | Freq: Every day | ORAL | Status: DC
  Administered 2020-07-18 – 2020-07-20 (×3): 81 mg via ORAL
  Filled 2020-07-18 (×3): qty 1

## 2020-07-18 MED ORDER — ACETAMINOPHEN 650 MG RE SUPP
650.0000 mg | Freq: Four times a day (QID) | RECTAL | Status: DC | PRN
Start: 2020-07-18 — End: 2020-07-20

## 2020-07-18 MED ORDER — ZOLPIDEM TARTRATE 5 MG PO TABS
5.0000 mg | ORAL_TABLET | Freq: Every evening | ORAL | Status: DC | PRN
  Administered 2020-07-19: 5 mg via ORAL
  Filled 2020-07-18: qty 1

## 2020-07-18 MED ORDER — INSULIN ASPART 100 UNIT/ML IJ SOLN: 0.0000 [IU] | Freq: Three times a day (TID) | INTRAMUSCULAR | Status: DC

## 2020-07-18 MED ORDER — MIDODRINE HCL 5 MG PO TABS
10.0000 mg | ORAL_TABLET | Freq: Three times a day (TID) | ORAL | Status: DC
  Administered 2020-07-18 – 2020-07-20 (×8): 10 mg via ORAL
  Filled 2020-07-18 (×8): qty 2

## 2020-07-18 MED ORDER — ONDANSETRON HCL 4 MG PO TABS: 4.0000 mg | ORAL_TABLET | Freq: Four times a day (QID) | ORAL | Status: DC | PRN

## 2020-07-18 MED ORDER — SODIUM CHLORIDE 0.9 % IV BOLUS
500.0000 mL | Freq: Once | INTRAVENOUS | Status: AC
  Administered 2020-07-18: 500 mL via INTRAVENOUS

## 2020-07-18 MED ORDER — ALLOPURINOL 300 MG PO TABS
300.0000 mg | ORAL_TABLET | Freq: Every day | ORAL | Status: DC
  Administered 2020-07-18 – 2020-07-20 (×3): 300 mg via ORAL
  Filled 2020-07-18 (×3): qty 1

## 2020-07-18 MED ORDER — ACETAMINOPHEN 325 MG PO TABS
650.0000 mg | ORAL_TABLET | Freq: Four times a day (QID) | ORAL | Status: DC | PRN
  Administered 2020-07-18 – 2020-07-19 (×2): 650 mg via ORAL
  Filled 2020-07-18 (×2): qty 2

## 2020-07-18 MED ORDER — FAMOTIDINE 20 MG PO TABS
20.0000 mg | ORAL_TABLET | Freq: Two times a day (BID) | ORAL | Status: DC
  Administered 2020-07-18 – 2020-07-20 (×5): 20 mg via ORAL
  Filled 2020-07-18 (×5): qty 1

## 2020-07-18 MED ORDER — SODIUM CHLORIDE 0.9% FLUSH
3.0000 mL | Freq: Two times a day (BID) | INTRAVENOUS | Status: DC
  Administered 2020-07-18 – 2020-07-19 (×3): 3 mL via INTRAVENOUS

## 2020-07-18 MED ORDER — DIPHENOXYLATE-ATROPINE 2.5-0.025 MG PO TABS: 1.0000 | ORAL_TABLET | Freq: Four times a day (QID) | ORAL | Status: DC | PRN

## 2020-07-18 MED ORDER — SODIUM CHLORIDE 0.9 % IV SOLN
1.0000 g | Freq: Once | INTRAVENOUS | Status: AC
  Administered 2020-07-18: 1 g via INTRAVENOUS
  Filled 2020-07-18: qty 10

## 2020-07-18 MED ORDER — SODIUM CHLORIDE 0.9 % IV SOLN: 1.0000 g | INTRAVENOUS | Status: DC

## 2020-07-18 MED ORDER — MECLIZINE HCL 12.5 MG PO TABS
12.5000 mg | ORAL_TABLET | Freq: Three times a day (TID) | ORAL | Status: DC | PRN
  Filled 2020-07-18: qty 1

## 2020-07-18 MED ORDER — SODIUM CHLORIDE 0.9% FLUSH
10.0000 mL | INTRAVENOUS | Status: DC | PRN
  Administered 2020-07-20: 10 mL

## 2020-07-18 MED ORDER — ROSUVASTATIN CALCIUM 5 MG PO TABS
5.0000 mg | ORAL_TABLET | Freq: Every day | ORAL | Status: DC
  Administered 2020-07-18 – 2020-07-20 (×3): 5 mg via ORAL
  Filled 2020-07-18 (×3): qty 1

## 2020-07-18 MED ORDER — DEXTROSE IN LACTATED RINGERS 5 % IV SOLN: INTRAVENOUS | Status: AC

## 2020-07-18 MED ORDER — TAMSULOSIN HCL 0.4 MG PO CAPS
0.4000 mg | ORAL_CAPSULE | Freq: Every day | ORAL | Status: DC
  Administered 2020-07-18 – 2020-07-20 (×3): 0.4 mg via ORAL
  Filled 2020-07-18 (×3): qty 1

## 2020-07-18 MED ORDER — CHLORHEXIDINE GLUCONATE CLOTH 2 % EX PADS
6.0000 | MEDICATED_PAD | Freq: Every day | CUTANEOUS | Status: DC
  Administered 2020-07-18 – 2020-07-20 (×3): 6 via TOPICAL

## 2020-07-18 MED ORDER — MONTELUKAST SODIUM 10 MG PO TABS
10.0000 mg | ORAL_TABLET | Freq: Every day | ORAL | Status: DC
  Administered 2020-07-18 – 2020-07-20 (×3): 10 mg via ORAL
  Filled 2020-07-18 (×3): qty 1

## 2020-07-18 MED ORDER — ACYCLOVIR 400 MG PO TABS
400.0000 mg | ORAL_TABLET | Freq: Two times a day (BID) | ORAL | Status: DC
  Administered 2020-07-18 – 2020-07-20 (×5): 400 mg via ORAL
  Filled 2020-07-18 (×6): qty 1

## 2020-07-18 MED ORDER — ACALABRUTINIB 100 MG PO CAPS: 100.0000 mg | ORAL_CAPSULE | Freq: Two times a day (BID) | ORAL | Status: DC

## 2020-07-18 MED ORDER — SODIUM CHLORIDE 0.9% FLUSH
10.0000 mL | Freq: Two times a day (BID) | INTRAVENOUS | Status: DC
  Administered 2020-07-18 (×2): 10 mL

## 2020-07-18 MED ORDER — ENOXAPARIN SODIUM 40 MG/0.4ML IJ SOSY
40.0000 mg | PREFILLED_SYRINGE | INTRAMUSCULAR | Status: DC
  Administered 2020-07-18: 40 mg via SUBCUTANEOUS
  Filled 2020-07-18: qty 0.4

## 2020-07-18 MED ORDER — ONDANSETRON HCL 4 MG/2ML IJ SOLN: 4.0000 mg | Freq: Four times a day (QID) | INTRAMUSCULAR | Status: DC | PRN

## 2020-07-18 MED ORDER — SODIUM CHLORIDE 0.9 % IV SOLN: 2.0000 g | INTRAVENOUS | Status: DC

## 2020-07-18 MED ORDER — MORPHINE SULFATE (PF) 2 MG/ML IV SOLN: 2.0000 mg | INTRAVENOUS | Status: DC | PRN

## 2020-07-18 NOTE — Consult Note (Signed)
I have been asked to see the patient by Dr. Karmen Bongo, for evaluation and management of SPT change/UTI.  History of present illness: Ryan Copeland is a 70 year old male w/ h/o acute urinary retention, BPH with LUTS and hypoactive bladder.   He initially presented to the ED in 11/2019 with acute metabolic encephalopathy with severe sepsis secondary to pneumonia. Over the course of his hospitalization, he had multiple bladder scans demonstrating elevated PVRs in the 500-800 range. He finally elected for Foley catheter placement.   He underwent urodynamics on 12/26/2019 which demonstrated an areflexic bladder. He had a bladder capacity of around 1 L with a delayed first sensation around 480 mL. He generated a weak voluntary contraction of 13-15 cm of water however was unable to void. Foley catheter was replaced.   BPH evaluation on 02/11/2020 revealed a 31 g prostate. Cystoscopy demonstrated trilobar hypertrophy with obstruction. Uroflow demonstrated average flow rate of 1.7 cc/sec and peak flow rate of 4.4 cc/sec with voided volume of 202 cc. His PVR was 170 mL. Foley catheter was replaced.   He was exchanged to intermittent catheterizations. In the days following this, he had significant difficulty performing CIC. This led to admission and sepsis with urine culture 03/05/2020 resulted in 100,000 Pseudomonas. He initially required management in the ICU and was resuscitated.   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 SP tube was placed on 04/20/2020.   The patient was last seen on May 10 by his primary urologist and his 22 [place] SP tube was exchanged at that time.  The patient presented to the emergency department by EMS with fatigue and lethargy.  He had altered mental status.  He was  noted to have bacteriuria on his urine analysis and was started on antibiotics in the emergency department.    Review of systems: A 12 point comprehensive review of systems was obtained and is negative unless otherwise stated in the history of present illness.  Patient Active Problem List   Diagnosis Date Noted  . UTI (urinary tract infection) 07/18/2020  . Gum lesion 05/29/2020  . Acute lower UTI 02/16/2020  . Bladder pain 02/16/2020  . Chronic hypotension 02/16/2020  . Vitamin B12 deficiency 01/01/2020  . Hypotension due to drugs 01/01/2020  . Protein-calorie malnutrition, moderate ([place]) 01/01/2020  . Septic shock ([place]) 12/15/2019  . Community acquired bacterial pneumonia 11/28/2019  . Cerebral vascular disease 11/25/2019  . 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 Other fatigue 10/25/2019  . Deficiency anemia 10/25/2019  . Dizziness 10/22/2019  . Gait abnormality 10/22/2019  . Hx of nonmelanoma skin cancer 03/15/2019  . Essential hypertension 04/24/2018  . Influenza A 04/17/2018  . Sepsis ([place]) 04/17/2018  . Chronic diastolic CHF (congestive heart failure) ([place]) 04/17/2018  . Skin rash 08/07/2017  . Port-A-Cath in place 08/04/2017  . 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 Retained dental roots 04/26/2017  . Dental caries 04/26/2017  . 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 Loose, teeth 04/26/2017  . Peripheral polyneuropathy 04/14/2017  . COPD mixed type ([place]) 02/23/2017  . Chronic sinusitis 01/27/2017  . Splenomegaly, congestive, chronic 12/29/2016  . Diarrhea 12/01/2016  . Poor dentition 12/01/2016  . Benign mole 12/01/2016  . Pancytopenia, acquired ([place]) 11/03/2016  . Odynophagia 01/15/2016  . Polycythemia, secondary 06/26/2015  . Quality of life palliative care encounter  06/26/2015  . Atherosclerosis of extremity with intermittent claudication ([place]) 01/23/2015  . Lateral epicondylitis of left elbow   . ECRB (extensor carpi  radialis brevis) tenosynovitis   . Anxiety 01/30/2014  . Preoperative clearance 01/30/2014  . Thrombocytopenia ([place]) 01/30/2014  . Diabetes mellitus without complication ([place]) 18/56/3149  . Hypogammaglobulinemia, acquired ([place]) 12/26/2012  . Hereditary and idiopathic peripheral neuropathy 08/20/2012  . 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 CAD (coronary artery disease) 07/29/2011  . 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 MI, acute, non ST segment elevation ([place])   . Hyperlipidemia   . PVD (peripheral vascular disease) ([place])   . GERD 09/27/2008  . SHOULDER STRAIN, RIGHT 09/27/2008    Current Facility-Administered Medications on File Prior to Encounter  Medication Dose Route Frequency Provider Last Rate Last Admin  . 0.9 %  sodium chloride infusion   Intravenous Continuous [name], MD 50 mL/hr at 03/07/14 1005 New Bag at 03/07/14 1005  . sodium chloride 0.9 % injection 10 mL  10 mL Intracatheter PRN Marcy Panning, MD   10 mL at 08/22/12 1721   Current Outpatient Medications on File Prior to Encounter  Medication Sig Dispense Refill  . acalabrutinib (CALQUENCE) 100 MG capsule TAKE 1 CAPSULE BY MOUTH TWICE A DAY (Patient taking differently: Take 100 mg by mouth 2 (two) times daily.) 60 capsule 11  . acetaminophen (TYLENOL) 325 MG tablet Take 2 tablets (650 mg total) by mouth every 6 (six) hours as needed for moderate pain or fever. 30 tablet 0  . acyclovir (ZOVIRAX) 400 MG tablet TAKE 1 TABLET BY MOUTH TWICE A DAY (Patient taking differently: Take 400 mg by mouth 2 (two) times daily.) 180 tablet 1  . allopurinol (ZYLOPRIM) 300 MG tablet TAKE 1 TABLET BY MOUTH EVERY DAY (Patient taking differently: Take 300 mg by mouth daily.) 90 tablet 1  . ALPRAZolam (XANAX) 0.5 MG tablet Take 0.5 mg by mouth 2 (two) times daily as needed for anxiety.    Marland Kitchen aspirin EC 81 MG tablet Take 81 mg by mouth daily. Swallow whole.    . b complex vitamins  tablet Take 1 tablet by mouth daily.     . benzonatate (TESSALON) 100 MG capsule TAKE TWO CAPSULES BY MOUTH THREE TIMES A DAY AS NEEDED (Patient taking differently: Take 200 mg by mouth 3 (three) times daily as needed for cough.) 30 capsule 2  . Cholecalciferol 25 MCG (1000 UT) tablet Take 1,000 Units by mouth daily.     . diphenoxylate-atropine (LOMOTIL) 2.5-0.025 MG tablet Take 1 tablet by mouth 4 (four) times daily as needed for diarrhea or loose stools. 60 tablet 1  . famotidine (PEPCID) 20 MG tablet TAKE 1 TABLET BY MOUTH TWICE A DAY (Patient taking differently: Take 20 mg by mouth 2 (two) times daily.) 180 tablet 1  . fluconazole (DIFLUCAN) 200 MG tablet Take 200 mg by mouth See admin instructions. Qd x 10 days    . HYDROcodone-acetaminophen (NORCO) 10-325 MG tablet Take 1 tablet by mouth every 6 (six) hours as needed for moderate pain.    Marland Kitchen lidocaine-prilocaine (EMLA) cream Apply 1 application topically as needed (prior chemo/IBIG).    Marland Kitchen meclizine (ANTIVERT) 12.5 MG tablet Take 12.5 mg by mouth 3 (three) times daily as needed for dizziness.    . metFORMIN (GLUCOPHAGE-XR) 500 MG 24 hr tablet Take 500 mg by mouth every evening.    . midodrine (PROAMATINE) 5 MG tablet Take 1.5 tablets (  7.5 mg total) by mouth 3 (three) times daily with meals. 135 tablet 1  . montelukast (SINGULAIR) 10 MG tablet TAKE 1 TABLET BY MOUTH EVERY DAY (Patient taking differently: Take 10 mg by mouth daily.) 90 tablet 1  . nitroGLYCERIN (NITROSTAT) 0.4 MG SL tablet PLACE 1 TABLET UNDER THE TONGUE EVERY 5 MINUTES X 3 DOSES AS NEEDED FOR CHEST PAIN *MAX 3 DOSES* (Patient taking differently: Place 0.4 mg under the tongue every 5 (five) minutes as needed for chest pain.) 25 tablet 3  . pregabalin (LYRICA) 200 MG capsule TAKE 1 CAPSULE (200 MG TOTAL) BY MOUTH 3 (THREE) TIMES DAILY. 270 capsule 1  . rosuvastatin (CRESTOR) 5 MG tablet Take 1 tablet (5 mg total) by mouth daily. 90 tablet 3  . sildenafil (VIAGRA) 100 MG tablet Take  0.5 tablets (50 mg total) by mouth as needed for erectile dysfunction (30 mintues prior to sexual intercourse). 10 tablet 3  . tamsulosin (FLOMAX) 0.4 MG CAPS capsule Take 1 capsule (0.4 mg total) by mouth daily. 30 capsule 0  . zolpidem (AMBIEN) 10 MG tablet Take 10 mg by mouth at bedtime as needed for sleep.      Past Medical History:  Diagnosis Date  . Anxiety 01/30/2014  . Back injury    lower disc  . CAD (coronary artery disease)   . CLL (chronic lymphocytic leukemia) ([place]) 03/18/2011  . COPD (chronic obstructive pulmonary disease) ([place])   . DM type 2 (diabetes mellitus, type 2) ([place])   . ECRB (extensor carpi radialis brevis) tenosynovitis   . Foley catheter in place 11/2019   last changed 04-14-2020  . GERD (gastroesophageal reflux disease)    takes Nexium if needed  . History of blood transfusion 2015  . Hyperlipidemia   . Insomnia   . 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 MI, acute, non ST segment elevation ([place]) 06/28/2009   with stenting of the LAD  . Neuromuscular disorder ([place])    peripheral neuropathy both all toes  . Pneumonia 11/2019  . PVD (peripheral vascular disease) ([place])   . Thrombocytopenia ([place])   . Tobacco abuse     Past Surgical History:  Procedure Laterality Date  . ADENOIDECTOMY  1955  . CARDIAC CATHETERIZATION    . CARDIAC CATHETERIZATION N/A 09/26/2014   Procedure: Left Heart Cath and Coronary Angiography;  Surgeon: Peter M Martinique, MD;  Location: [facility];  Service: Cardiovascular;  Laterality: N/A;  . carpel tunnel release Left 04-1989  . carpel tunnel release  Right 01-1989  . CHOLECYSTECTOMY  2007   lapa  . CORONARY STENT PLACEMENT  06/2009   stent to lad  . CYSTOSCOPY N/A 04/20/2020   Procedure: CYSTOSCOPY WITH SUPRA PUBIC PLACEMENT;  Surgeon: Janith Lima, MD;  Location[facility];  Service: Urology;  Laterality: N/A;  ONLY NEEDS 30 MIN  . femoral stents  09/2014   iliac  stent  . IR CV LINE INJECTION  08/18/2017  . IR CV LINE INJECTION  09/01/2017  . IR CV LINE INJECTION  02/02/2018  . LATERAL EPICONDYLE RELEASE Left 02/12/2014   Procedure: LEFT ELBOW DEBRIDEMENT WITH TENDON REPAIR ;  Surgeon: Lorn Junes, MD;  Location: [facility];  Service: Orthopedics;  Laterality: Left;  . LEFT CAI STENT/PTA AND POPLITEAL ARTERY/TIBIAL THROMBECTOMY     . LEFT HEART CATHETERIZATION WITH CORONARY ANGIOGRAM N/A 08/26/2011   Procedure: LEFT HEART CATHETERIZATION WITH CORONARY ANGIOGRAM;  Surgeon: [name]  M Martinique, MD;  Location[facility];  Service: Cardiovascular;  Laterality: N/A;  . PERIPHERAL VASCULAR CATHETERIZATION N/A 01/01/2015   Procedure: Abdominal Aortogram;  Surgeon: Conrad Nez Perce, MD;  Location: [facility];  Service: Cardiovascular;  Laterality: N/A;  . port a cath insertion  2014   right   . TARSAL TUNNEL RELEASE Bilateral 08-2007    Social History   Tobacco Use  . Smoking status: Former Smoker    Packs/day: 1.00    Years: 44.00    Pack years: 44.00    Types: Cigarettes    Quit date: 04/23/2016    Years since quitting: 4.2  . Smokeless tobacco: Former Network engineer  . Vaping Use: Never used  Substance Use Topics  . Alcohol use: Not Currently    Alcohol/week: 0.0 standard drinks  . Drug use: No    Family History  Problem Relation Age of Onset  . Lung cancer Mother 89  . Cancer Mother        lung  . Heart failure Father 29  . Heart disease Father     PE: Vitals:   07/18/20 0800 07/18/20 0815 07/18/20 0832 07/18/20 1137  BP:  (!) 100/51  (!) 90/53  Pulse: 64 72  (!) 58  Resp: 20 17  17   Temp:   97.9 F (36.6 C) 98.3 F (36.8 C)  TempSrc:   Oral Oral  SpO2: 98% 98%  99%   Patient appears to be in no acute distress  patient is alert and oriented x3 - sleepy, but easily arousable and conversant Atraumatic normocephalic head No cervical or supraclavicular lymphadenopathy appreciated No increased work of breathing, no audible  wheezes/rhonchi Regular sinus rhythm/rate Abdomen is soft, nontender, nondistended, no CVA or suprapubic tenderness SP tube site clean Lower extremities are symmetric without appreciable edema Grossly neurologically intact No identifiable skin lesions  Recent Labs    07/18/20 0148 07/18/20 0315  WBC  --  6.2  HGB 14.3 11.7*  HCT 42.0 37.0*   Recent Labs    07/18/20 0148 07/18/20 0315  NA 139 140  K 4.8 4.2  CL  --  107  CO2  --  23  GLUCOSE  --  75  BUN  --  10  CREATININE  --  0.77  CALCIUM  --  8.8*   No results for input(s): LABPT, INR in the last 72 hours. No results for input(s): LABURIN in the last 72 hours. Results for orders placed or performed during the hospital encounter of 07/18/20  Resp Panel by RT-PCR (Flu A&B, Covid) Nasopharyngeal Swab     Status: None   Collection Time: 07/18/20  5:28 AM   Specimen: Nasopharyngeal Swab; Nasopharyngeal(NP) swabs in vial transport medium  Result Value Ref Range Status   SARS Coronavirus 2 by RT PCR NEGATIVE NEGATIVE Final    Comment: (NOTE) SARS-CoV-2 target nucleic acids are NOT DETECTED.  The SARS-CoV-2 RNA is generally detectable in upper respiratory specimens during the acute phase of infection. The lowest concentration of SARS-CoV-2 viral copies this assay can detect is 138 copies/mL. A negative result does not preclude SARS-Cov-2 infection and should not be used as the sole basis for treatment or other patient management decisions. A negative result may occur with  improper specimen collection/handling, submission of specimen other than nasopharyngeal swab, presence of viral mutation(s) within the areas targeted by this assay, and inadequate number of viral copies(<138 copies/mL). A negative result must be combined with clinical observations,  patient history, and epidemiological information. The expected result is Negative.  Fact Sheet for Patients:  EntrepreneurPulse.com.au  Fact Sheet for  Healthcare Providers:  IncredibleEmployment.be  This test is no t yet approved or cleared by the [place] FDA and  has been authorized for detection and/or diagnosis of SARS-CoV-2 by FDA under an Emergency Use Authorization (EUA). This EUA will remain  in effect (meaning this test can be used) for the duration of the COVID-19 declaration under Section 564(b)(1) of the Act, 21 U.S.C.section 360bbb-3(b)(1), unless the authorization is terminated  or revoked sooner.       Influenza A by PCR NEGATIVE NEGATIVE Final   Influenza B by PCR NEGATIVE NEGATIVE Final    Comment: (NOTE) The Xpert Xpress SARS-CoV-2/FLU/RSV plus assay is intended as an aid in the diagnosis of influenza from Nasopharyngeal swab specimens and should not be used as a sole basis for treatment. Nasal washings and aspirates are unacceptable for Xpert Xpress SARS-CoV-2/FLU/RSV testing.  Fact Sheet for Patients: EntrepreneurPulse.com.au  Fact Sheet for Healthcare Providers: IncredibleEmployment.be  This test is not yet approved or cleared by the [place] FDA and has been authorized for detection and/or diagnosis of SARS-CoV-2 by FDA under an Emergency Use Authorization (EUA). This EUA will remain in effect (meaning this test can be used) for the duration of the COVID-19 declaration under Section 564(b)(1) of the Act, 21 U.S.C. section 360bbb-3(b)(1), unless the authorization is terminated or revoked.  Performed at [facility], [place] [address] [place], [place] [address]    *Note: Due to a large number of results and/or encounters for the requested time period, some results have not been displayed. A complete set of results can be found in Results Review.    Procedure: After removing the old SPT and discarding it, I prepped the site with betadine in the routine fashion.  I then gently replaced a new silastic 31F foley into the SPT site  atraumatically.  I inflated the catheter balloon w/ 10 cc of sterile water.  I redressed the site with drain sponges and tape. I attached the foley tubing to the leg.  He tolerated the procedure well.  Imaging: none  Imp: Atonic poorly functioning bladder managed currently with SPT, last changed on May 10th admitted with worsening lethargy and confusion.  I suspect that he has a recurrent pseudomonas UTI, but cultures are still pending.  Recommendations:  IT has been my experience that pseudomonas infections in men need to be treated for at least 30 days or they recur.   SPT was changed today atraumatically.  It can be changed again in the clinic in 4 weeks.   Ardis Hughs

## 2020-07-18 NOTE — ED Provider Notes (Signed)
Ryan Copeland   CSN: [id number] Arrival date & time: 07/18/20  0035     History Chief Complaint  Patient presents with  . Weakness    Ryan Copeland is a 70 y.o. male.  70 yo M with a chief complaints of fatigue.  This was reported by EMS.  Apparently the patient has been fatigued for the past 48 hours or so.  No other reported symptoms.  Patient is very sleepy on my initial exam tells me that he thinks maybe he was trying to hurt himself but is unable to provide much history.  Level 5 caveat altered mental status.   Weakness      Past Medical History:  Diagnosis Date  . Anxiety 01/30/2014  . Back injury    lower disc  . CAD (coronary artery disease)   . CLL (chronic lymphocytic leukemia) ([place]) 03/18/2011  . COPD (chronic obstructive pulmonary disease) ([place])   . DM type 2 (diabetes mellitus, type 2) ([place])   . ECRB (extensor carpi radialis brevis) tenosynovitis   . Foley catheter in place 11/2019   last changed 04-14-2020  . GERD (gastroesophageal reflux disease)    takes Nexium if needed  . History of blood transfusion 2015  . Hyperlipidemia   . Insomnia   . 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 MI, acute, non ST segment elevation ([place]) 06/28/2009   with stenting of the LAD  . Neuromuscular disorder ([place])    peripheral neuropathy both all toes  . Pneumonia 11/2019  . PVD (peripheral vascular disease) ([place])   . Thrombocytopenia ([place])   . Tobacco abuse     Patient Active Problem List   Diagnosis Date Noted  . UTI (urinary tract infection) 07/18/2020  . Gum lesion 05/29/2020  . Acute lower UTI 02/16/2020  . Bladder pain 02/16/2020  . Chronic hypotension 02/16/2020  . Vitamin B12 deficiency 01/01/2020  . Hypotension due to drugs 01/01/2020  . Protein-calorie malnutrition, moderate ([place]) 01/01/2020  . Septic shock ([place]) 12/15/2019  . Community acquired bacterial pneumonia  11/28/2019  . Cerebral vascular disease 11/25/2019  . 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 Other fatigue 10/25/2019  . Deficiency anemia 10/25/2019  . Dizziness 10/22/2019  . Gait abnormality 10/22/2019  . Hx of nonmelanoma skin cancer 03/15/2019  . Essential hypertension 04/24/2018  . Influenza A 04/17/2018  . Sepsis ([place]) 04/17/2018  . Chronic diastolic CHF (congestive heart failure) ([place]) 04/17/2018  . Skin rash 08/07/2017  . Port-A-Cath in place 08/04/2017  . 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 Retained dental roots 04/26/2017  . Dental caries 04/26/2017  . 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 Loose, teeth 04/26/2017  . Peripheral polyneuropathy 04/14/2017  . COPD mixed type ([place]) 02/23/2017  . Chronic sinusitis 01/27/2017  . Splenomegaly, congestive, chronic 12/29/2016  . Diarrhea 12/01/2016  . Poor dentition 12/01/2016  . Benign mole 12/01/2016  . Pancytopenia, acquired ([place]) 11/03/2016  . Odynophagia 01/15/2016  . Polycythemia, secondary 06/26/2015  . Quality of life palliative care encounter 06/26/2015  . Atherosclerosis of extremity with intermittent claudication ([place]) 01/23/2015  . Lateral epicondylitis of left elbow   . ECRB (extensor carpi radialis brevis) tenosynovitis   . Anxiety 01/30/2014  . Preoperative clearance 01/30/2014  . Thrombocytopenia ([place]) 01/30/2014  . Diabetes mellitus without complication ([place]) 93/81/8299  . Hypogammaglobulinemia, acquired ([place]) 12/26/2012  . Hereditary and idiopathic peripheral neuropathy 08/20/2012  . 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 CAD (coronary artery disease) 07/29/2011  . 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 MI, acute, non ST segment elevation ([place])   . Hyperlipidemia   . PVD (peripheral vascular disease) ([place])   . GERD 09/27/2008  . SHOULDER STRAIN, RIGHT 09/27/2008    Past Surgical History:  Procedure  Laterality Date  . ADENOIDECTOMY  1955  . CARDIAC CATHETERIZATION    . CARDIAC CATHETERIZATION N/A 09/26/2014   Procedure: Left Heart Cath and Coronary Angiography;  Surgeon: Peter M Martinique, MD;  Location: [facility];  Service: Cardiovascular;  Laterality: N/A;  . carpel tunnel release Left 04-1989  . carpel tunnel release  Right 01-1989  . CHOLECYSTECTOMY  2007   lapa  . CORONARY STENT PLACEMENT  06/2009   stent to lad  . CYSTOSCOPY N/A 04/20/2020   Procedure: CYSTOSCOPY WITH SUPRA PUBIC PLACEMENT;  Surgeon: Janith Lima, MD;  Location: [facility];  Service: Urology;  Laterality: N/A;  ONLY NEEDS 30 MIN  . femoral stents  09/2014   iliac stent  . IR CV LINE INJECTION  08/18/2017  . IR CV LINE INJECTION  09/01/2017  . IR CV LINE INJECTION  02/02/2018  . LATERAL EPICONDYLE RELEASE Left 02/12/2014   Procedure: LEFT ELBOW DEBRIDEMENT WITH TENDON REPAIR ;  Surgeon: Lorn Junes, MD;  Location: [facility];  Service: Orthopedics;  Laterality: Left;  . LEFT CAI STENT/PTA AND POPLITEAL ARTERY/TIBIAL THROMBECTOMY     . LEFT HEART CATHETERIZATION WITH CORONARY ANGIOGRAM N/A 08/26/2011   Procedure: LEFT HEART CATHETERIZATION WITH CORONARY ANGIOGRAM;  Surgeon: Peter M Martinique, MD;  Location: [facility];  Service: Cardiovascular;  Laterality: N/A;  . PERIPHERAL VASCULAR CATHETERIZATION N/A 01/01/2015   Procedure: Abdominal Aortogram;  Surgeon: Conrad Sandy Hook, MD;  Location: [facility];  Service: Cardiovascular;  Laterality: N/A;  . port a cath insertion  2014   right   . TARSAL TUNNEL RELEASE Bilateral 08-2007       Family History  Problem Relation Age of Onset  . Lung cancer Mother 63  . Cancer Mother        lung  . Heart failure Father 33  . Heart disease Father     Social History   Tobacco Use  . Smoking status: Former Smoker    Packs/day: 1.00    Years: 44.00    Pack years: 44.00    Types: Cigarettes    Quit date: 04/23/2016    Years since quitting: 4.2   . Smokeless tobacco: Former Network engineer  . Vaping Use: Never used  Substance Use Topics  . Alcohol use: Not Currently    Alcohol/week: 0.0 standard drinks  . Drug use: No    Home Medications Prior to Admission medications   Medication Sig Start Date End Date Taking? Authorizing Provider  acalabrutinib (CALQUENCE) 100 MG capsule TAKE 1 CAPSULE BY MOUTH TWICE A DAY Patient taking differently: Take 100 mg by mouth 2 (two) times daily. 03/11/20 03/11/21 Yes [name], MD  acetaminophen (TYLENOL) 325 MG tablet Take 2 tablets (650 mg total) by mouth every 6 (six) hours as needed for moderate pain or fever. 02/20/20  Yes Swayze, Ava, DO  acyclovir (ZOVIRAX) 400 MG tablet TAKE 1 TABLET BY MOUTH TWICE A DAY Patient taking differently: Take 400 mg by mouth 2 (two) times daily. 06/29/20  Yes Gorsuch, Ni, MD  allopurinol (ZYLOPRIM) 300 MG tablet TAKE 1  TABLET BY MOUTH EVERY DAY Patient taking differently: Take 300 mg by mouth daily. 03/17/20  Yes Gorsuch, Ni, MD  ALPRAZolam Duanne Moron) 0.5 MG tablet Take 0.5 mg by mouth 2 (two) times daily as needed for anxiety.   Yes [provider]  aspirin EC 81 MG tablet Take 81 mg by mouth daily. Swallow whole.   Yes [provider]  b complex vitamins tablet Take 1 tablet by mouth daily.    Yes [provider]  benzonatate (TESSALON) 100 MG capsule TAKE TWO CAPSULES BY MOUTH THREE TIMES A DAY AS NEEDED Patient taking differently: Take 200 mg by mouth 3 (three) times daily as needed for cough. 06/24/20  Yes Spero Geralds, MD  Cholecalciferol 25 MCG (1000 UT) tablet Take 1,000 Units by mouth daily.    Yes [provider]  diphenoxylate-atropine (LOMOTIL) 2.5-0.025 MG tablet Take 1 tablet by mouth 4 (four) times daily as needed for diarrhea or loose stools. 04/03/20  Yes Gorsuch, Ni, MD  famotidine (PEPCID) 20 MG tablet TAKE 1 TABLET BY MOUTH TWICE A DAY Patient taking differently: Take 20 mg by mouth 2 (two) times daily. 02/18/20   Yes Gorsuch, Ni, MD  fluconazole (DIFLUCAN) 200 MG tablet Take 200 mg by mouth See admin instructions. Daily until gone 07/17/20  Yes [provider]  HYDROcodone-acetaminophen (NORCO) 10-325 MG tablet Take 1 tablet by mouth every 6 (six) hours as needed for moderate pain.   Yes [provider]  lidocaine-prilocaine (EMLA) cream Apply 1 application topically as needed (prior chemo/IBIG).   Yes [provider]  meclizine (ANTIVERT) 12.5 MG tablet Take 12.5 mg by mouth 3 (three) times daily as needed for dizziness.   Yes [provider]  metFORMIN (GLUCOPHAGE-XR) 500 MG 24 hr tablet Take 500 mg by mouth every evening.   Yes [provider]  midodrine (PROAMATINE) 5 MG tablet Take 1.5 tablets (7.5 mg total) by mouth 3 (three) times daily with meals. 07/08/20  Yes Pemberton, Greer Ee, MD  montelukast (SINGULAIR) 10 MG tablet TAKE 1 TABLET BY MOUTH EVERY DAY Patient taking differently: Take 10 mg by mouth daily. 07/14/20  Yes Spero Geralds, MD  nitroGLYCERIN (NITROSTAT) 0.4 MG SL tablet PLACE 1 TABLET UNDER THE TONGUE EVERY 5 MINUTES X 3 DOSES AS NEEDED FOR CHEST PAIN *MAX 3 DOSES* Patient taking differently: Place 0.4 mg under the tongue every 5 (five) minutes as needed for chest pain. 10/03/19  Yes Burtis Junes, NP  pregabalin (LYRICA) 200 MG capsule TAKE 1 CAPSULE (200 MG TOTAL) BY MOUTH 3 (THREE) TIMES DAILY. 09/02/19  Yes Suzzanne Cloud, NP  rosuvastatin (CRESTOR) 5 MG tablet Take 1 tablet (5 mg total) by mouth daily. 07/09/20  Yes Freada Bergeron, MD  tamsulosin (FLOMAX) 0.4 MG CAPS capsule Take 1 capsule (0.4 mg total) by mouth daily. 12/06/19  Yes Charlynne Cousins, MD  zolpidem (AMBIEN) 10 MG tablet Take 10 mg by mouth at bedtime as needed for sleep. 03/09/20  Yes [provider]  sildenafil (VIAGRA) 100 MG tablet Take 0.5 tablets (50 mg total) by mouth as needed for erectile dysfunction (30 mintues prior to sexual intercourse). 04/08/20    Burtis Junes, NP    Allergies    Immune globulin and Codeine  Review of Systems   Review of Systems  Unable to perform ROS: Mental status change  Neurological: Positive for weakness.    Physical Exam Updated Vital Signs BP (!) 96/50   Pulse 66  Temp 98.9 F (37.2 C) (Rectal)   Resp (!) 21   SpO2 94%   Physical Exam Vitals and nursing Copeland reviewed.  Constitutional:      Appearance: He is well-developed.  HENT:     Head: Normocephalic and atraumatic.  Eyes:     Pupils: Pupils are equal, round, and reactive to light.  Neck:     Vascular: No JVD.  Cardiovascular:     Rate and Rhythm: Normal rate and regular rhythm.     Heart sounds: No murmur heard. No friction rub. No gallop.   Pulmonary:     Effort: No respiratory distress.     Breath sounds: No wheezing.  Abdominal:     General: There is no distension.     Tenderness: There is no abdominal tenderness. There is no guarding or rebound.  Musculoskeletal:        General: Normal range of motion.     Cervical back: Normal range of motion and neck supple.  Skin:    Coloration: Skin is not pale.     Findings: No rash.  Neurological:     Mental Status: He is alert.     Comments: Awakens to loud speech and shaking of the bed.  Moves all 4 extremities spontaneously.  Psychiatric:        Behavior: Behavior normal.     ED Results / Procedures / Treatments   Labs (all labs ordered are listed, but only abnormal results are displayed) Labs Reviewed  COMPREHENSIVE METABOLIC PANEL - Abnormal; Notable for the following components:      Result Value   Calcium 8.8 (*)    Total Protein 5.4 (*)    Albumin 3.2 (*)    AST 14 (*)    All other components within normal limits  CBC WITH DIFFERENTIAL/PLATELET - Abnormal; Notable for the following components:   RBC 3.81 (*)    Hemoglobin 11.7 (*)    HCT 37.0 (*)    RDW 18.4 (*)    Platelets 76 (*)    All other components within normal limits  URINALYSIS, ROUTINE W  REFLEX MICROSCOPIC - Abnormal; Notable for the following components:   APPearance HAZY (*)    Hgb urine dipstick SMALL (*)    Nitrite POSITIVE (*)    Leukocytes,Ua LARGE (*)    Bacteria, UA FEW (*)    All other components within normal limits  I-STAT VENOUS BLOOD GAS, ED - Abnormal; Notable for the following components:   pCO2, Ven 38.3 (*)    pO2, Ven 26.0 (*)    Calcium, Ion 1.07 (*)    All other components within normal limits  RESP PANEL BY RT-PCR (FLU A&B, COVID) ARPGX2  URINE CULTURE  CULTURE, BLOOD (ROUTINE X 2)  CULTURE, BLOOD (ROUTINE X 2)  AMMONIA  LACTIC ACID, PLASMA  ETHANOL  CBG MONITORING, ED    EKG None  Radiology CT HEAD WO CONTRAST  Result Date: 07/18/2020 CLINICAL DATA:  Encephalopathy EXAM: CT HEAD WITHOUT CONTRAST TECHNIQUE: Contiguous axial images were obtained from the base of the skull through the vertex without intravenous contrast. COMPARISON:  None. FINDINGS: Brain: There is no mass, hemorrhage or extra-axial collection. The size and configuration of the ventricles and extra-axial CSF spaces are normal. There is hypoattenuation of the white matter, most commonly indicating chronic small vessel disease. Vascular: Atherosclerotic calcification of the internal carotid arteries at the skull base. No abnormal hyperdensity of the major intracranial arteries or dural venous sinuses. Skull: The visualized skull base,  calvarium and extracranial soft tissues are normal. Sinuses/Orbits: Ethmoid and maxillary chronic sinusitis. The orbits are normal. IMPRESSION: 1. No acute intracranial abnormality. 2. Chronic sinusitis. Electronically Signed   By: Ulyses Jarred M.D.   On: 07/18/2020 02:46   DG Chest Port 1 View  Result Date: 07/18/2020 CLINICAL DATA:  Altered mental status EXAM: PORTABLE CHEST 1 VIEW COMPARISON:  02/15/2020 FINDINGS: The heart size and mediastinal contours are within normal limits. Both lungs are clear. The visualized skeletal structures are unremarkable.  There is a right chest wall power-injectable Port-A-Cath with tip at the cavoatrial junction via a right internal jugular vein approach. IMPRESSION: No active disease. Electronically Signed   By: Ulyses Jarred M.D.   On: 07/18/2020 01:57    Procedures Procedures   Medications Ordered in ED Medications  sodium chloride flush (NS) 0.9 % injection 10-40 mL (10 mLs Intracatheter Given 07/18/20 0356)  sodium chloride flush (NS) 0.9 % injection 10-40 mL (has no administration in time range)  Chlorhexidine Gluconate Cloth 2 % PADS 6 each (has no administration in time range)  midodrine (PROAMATINE) tablet 10 mg (10 mg Oral Given 07/18/20 0626)  dextrose 5 % in lactated ringers infusion ( Intravenous New Bag/Given 07/18/20 0629)  cefTAZidime (FORTAZ) 1 g in sodium chloride 0.9 % 100 mL IVPB (has no administration in time range)  sodium chloride 0.9 % bolus 1,000 mL (0 mLs Intravenous Stopped 07/18/20 0554)  cefTRIAXone (ROCEPHIN) 1 g in sodium chloride 0.9 % 100 mL IVPB (0 g Intravenous Stopped 07/18/20 0627)  sodium chloride 0.9 % bolus 1,000 mL (1,000 mLs Intravenous New Bag/Given 07/18/20 0551)  sodium chloride 0.9 % bolus 500 mL (500 mLs Intravenous New Bag/Given 07/18/20 0175)    ED Course  I have reviewed the triage vital signs and the nursing notes.  Pertinent labs & imaging results that were available during my care of the patient were reviewed by me and considered in my medical decision making (see chart for details).    MDM Rules/Calculators/A&P                          70 yo M with a chief complaints of fatigue.  Patient is unable to provide much history. Altered mental status work-up ordered.  Patient with a urinary tract infection on his UA.  Will start on antibiotics.  Will discuss with medicine for admission.  The patients results and plan were reviewed and discussed.   Any x-rays performed were independently reviewed by myself.   Differential diagnosis were considered with the  presenting HPI.  Medications  sodium chloride flush (NS) 0.9 % injection 10-40 mL (10 mLs Intracatheter Given 07/18/20 0356)  sodium chloride flush (NS) 0.9 % injection 10-40 mL (has no administration in time range)  Chlorhexidine Gluconate Cloth 2 % PADS 6 each (has no administration in time range)  midodrine (PROAMATINE) tablet 10 mg (10 mg Oral Given 07/18/20 0626)  dextrose 5 % in lactated ringers infusion ( Intravenous New Bag/Given 07/18/20 0629)  cefTAZidime (FORTAZ) 1 g in sodium chloride 0.9 % 100 mL IVPB (has no administration in time range)  sodium chloride 0.9 % bolus 1,000 mL (0 mLs Intravenous Stopped 07/18/20 0554)  cefTRIAXone (ROCEPHIN) 1 g in sodium chloride 0.9 % 100 mL IVPB (0 g Intravenous Stopped 07/18/20 0627)  sodium chloride 0.9 % bolus 1,000 mL (1,000 mLs Intravenous New Bag/Given 07/18/20 0551)  sodium chloride 0.9 % bolus 500 mL (500 mLs Intravenous New Bag/Given  07/18/20 1898)    Vitals:   07/18/20 0040 07/18/20 0209 07/18/20 0400 07/18/20 0430  BP: 113/61  102/66 (!) 96/50  Pulse: 68  64 66  Resp: 16  20 (!) 21  Temp: 98.6 F (37 C) 98.9 F (37.2 C)    TempSrc: Oral Rectal    SpO2: 100%  96% 94%    Final diagnoses:  Acute cystitis without hematuria    Admission/ observation were discussed with the admitting physician, patient and/or family and they are comfortable with the plan.   Final Clinical Impression(s) / ED Diagnoses Final diagnoses:  Acute cystitis without hematuria    Rx / DC Orders ED Discharge Orders    None       Deno Etienne, DO 07/18/20 4210

## 2020-07-18 NOTE — Plan of Care (Signed)

## 2020-07-18 NOTE — ED Triage Notes (Signed)
Pt brought to EDa b y [facility] with c/o generalized weakness all day. EMS report pt hx of leukemia and prior episodes of sepsis. Pt Aox4 at time of arrival with slurred speech.

## 2020-07-18 NOTE — H&P (Signed)
History and Physical    Ryan Copeland:[id number] DOB: April 29, 1950 DOA: 07/18/2020  PCP: [name]., PA-C Consultants:  Johney Frame - cardiology; Alvy Bimler - oncology; Abner Greenspan - urology; Shearon Stalls - pulmonology; Benson Norway - GI Patient coming from: Home - lives with wife; NOK: Wife, Mike Hamre, [phone]  Chief Complaint: Fatigue  HPI: Ryan Copeland is a 70 y.o. male with medical history significant of PVD/CAD s/p stent; HLD; DM; COPD; chronic hypotension on midodrine; and CLL presenting with fatigue.  He has no clue why he came to the hospital.  He denies feeling weak or tired, reports feeling pretty good.  No urinary symptoms - he has a suprapubic catheter in place, due for exchange on Tuesday.   His wife reports that he slept most of the day yesterday.  He fell in the kitchen floor and it was hard to get him up.  Last night, they emptied his catheter and he went back into the kitchen and was unresponsive on the bar stool, no control over hands and arms.  His BP was low, 70/56 and he was nonverbal.  He did not fall off the bar stool.  She called 911.  She was not concerned about infection, no fevers at home.  Yesterday AM he complained of not feeling good and being tired.  He slept all day - which is mildly abnormal.    He reports that the nurse gave him his midodrine this AM but he dropped it in the floor and was unable to take it; he is concerned that she charted it as having been given.    His COVID vaccine "started all of this".  They had their first shot in August and he "down" for 3-5 days.  He went for his second and was down for 3 weeks.  He went to his PCP and ended up hospitalized with PNA.  In November, he had another infection.  In January he had sepsis of uncertain source.  He had suprapubic catheter placed in March.  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  2 years later he had chemo again and IVIG monthly after.  Now he is on  MAB therapy and IVIG monthly for energy.   Urine culture on 02/15/20 + for Pseudomonas, resistant to Cipro.    ED Course:  Carryover, per Dr. Nevada Crane:  Fatigue, worse in the past 3 days. No other reported symptoms.   Patient is very sleepy in the ED which prompted EDP to obtain VBG. Venous blood gas revealed pH 7.393, PCO2 38 on 3 L [place].   Work-up in the ED revealed UTI. Last urine culture grew Pseudomonas aeruginosa with resistance to ciprofloxacin and sensitivity to ceftazidime. Started on Rocephin in the ED, switching to IV Ceftazidime. Follow cultures for ID and sensitivities.  Review of Systems: As per HPI; otherwise review of systems reviewed and negative.   Ambulatory Status:  Ambulates without assistance  COVID Vaccine Status:   Complete  Past Medical History:  Diagnosis Date  . Anxiety 01/30/2014  . Back injury    lower disc  . CAD (coronary artery disease)   . CLL (chronic lymphocytic leukemia) ([place]) 03/18/2011  . COPD (chronic obstructive pulmonary disease) ([place])   . DM type 2 (diabetes mellitus, type 2) ([place])   . ECRB (extensor carpi radialis brevis) tenosynovitis   . Foley catheter in place 11/2019   last changed 04-14-2020  . GERD (gastroesophageal reflux disease)  takes Nexium if needed  . History of blood transfusion 2015  . Hyperlipidemia   . Insomnia   . 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 MI, acute, non ST segment elevation ([place]) 06/28/2009   with stenting of the LAD  . Neuromuscular disorder ([place])    peripheral neuropathy both all toes  . Pneumonia 11/2019  . PVD (peripheral vascular disease) ([place])   . Thrombocytopenia ([place])   . Tobacco abuse     Past Surgical History:  Procedure Laterality Date  . ADENOIDECTOMY  1955  . CARDIAC CATHETERIZATION    . CARDIAC CATHETERIZATION N/A 09/26/2014   Procedure: Left Heart Cath and Coronary Angiography;  Surgeon: Peter M Martinique, MD;  Location: [place] [facility];   Service: Cardiovascular;  Laterality: N/A;  . carpel tunnel release Left 04-1989  . carpel tunnel release  Right 01-1989  . CHOLECYSTECTOMY  2007   lapa  . CORONARY STENT PLACEMENT  06/2009   stent to lad  . CYSTOSCOPY N/A 04/20/2020   Procedure: CYSTOSCOPY WITH SUPRA PUBIC PLACEMENT;  Surgeon: Janith Lima, MD;  Location[facility];  Service: Urology;  Laterality: N/A;  ONLY NEEDS 30 MIN  . femoral stents  09/2014   iliac stent  . IR CV LINE INJECTION  08/18/2017  . IR CV LINE INJECTION  09/01/2017  . IR CV LINE INJECTION  02/02/2018  . LATERAL EPICONDYLE RELEASE Left 02/12/2014   Procedure: LEFT ELBOW DEBRIDEMENT WITH TENDON REPAIR ;  Surgeon: Lorn Junes, MD;  Location: [facility];  Service: Orthopedics;  Laterality: Left;  . LEFT CAI STENT/PTA AND POPLITEAL ARTERY/TIBIAL THROMBECTOMY     . LEFT HEART CATHETERIZATION WITH CORONARY ANGIOGRAM N/A 08/26/2011   Procedure: LEFT HEART CATHETERIZATION WITH CORONARY ANGIOGRAM;  Surgeon: Peter M Martinique, MD;  Location[facility];  Service: Cardiovascular;  Laterality: N/A;  . PERIPHERAL VASCULAR CATHETERIZATION N/A 01/01/2015   Procedure: Abdominal Aortogram;  Surgeon: Conrad Herkimer, MD;  Location: [place] [facility];  Service: Cardiovascular;  Laterality: N/A;  . port a cath insertion  2014   right   . TARSAL TUNNEL RELEASE Bilateral 08-2007    Social History   Socioeconomic History  . Marital status: Married    Spouse name: [name]  . Number of children: 1  . Years of education: College  . Highest education level: Not on file  Occupational History  . Occupation: retired    Fish farm manager: [employer]    Comment: [employer]  Tobacco Use  . Smoking status: Former Smoker    Packs/day: 1.00    Years: 44.00    Pack years: 44.00    Types: Cigarettes    Quit date: 04/23/2016    Years since quitting: 4.2  . Smokeless tobacco: Former Network engineer  . Vaping Use: Never used  Substance and Sexual Activity  .  Alcohol use: Not Currently    Alcohol/week: 0.0 standard drinks  . Drug use: No  . Sexual activity: Yes  Other Topics Concern  . Not on file  Social History Narrative   Patient lives at home with wife.   Caffeine Use: 15 cups of caffeine weekly   Social Determinants of Health   Financial Resource Strain: Not on file  Food Insecurity: No Food Insecurity  . Worried About Charity fundraiser in the Last Year: Never true  . Ran Out of Food in the Last Year: Never true  Transportation  Needs: No Transportation Needs  . Lack of Transportation (Medical): No  . Lack of Transportation (Non-Medical): No  Physical Activity: Not on file  Stress: Not on file  Social Connections: Not on file  Intimate Partner Violence: Not on file    Allergies  Allergen Reactions  . Immune Globulin Hives and Rash    Pt reports hives/rash on body with IV Privigen. He was changed to IV Gamunex-C and had many infusions without adverse side effects.   . Codeine Hives    Pt states he can take a few, more reaction with extended doses.    Family History  Problem Relation Age of Onset  . Lung cancer Mother 64  . Cancer Mother        lung  . Heart failure Father 95  . Heart disease Father     Prior to Admission medications   Medication Sig Start Date End Date Taking? Authorizing Provider  acalabrutinib (CALQUENCE) 100 MG capsule TAKE 1 CAPSULE BY MOUTH TWICE A DAY Patient taking differently: Take 100 mg by mouth 2 (two) times daily. 03/11/20 03/11/21 Yes [name], MD  acetaminophen (TYLENOL) 325 MG tablet Take 2 tablets (650 mg total) by mouth every 6 (six) hours as needed for moderate pain or fever. 02/20/20  Yes Swayze, Ava, DO  acyclovir (ZOVIRAX) 400 MG tablet TAKE 1 TABLET BY MOUTH TWICE A DAY Patient taking differently: Take 400 mg by mouth 2 (two) times daily. 06/29/20  Yes Gorsuch, Ni, MD  allopurinol (ZYLOPRIM) 300 MG tablet TAKE 1 TABLET BY MOUTH EVERY DAY Patient taking differently: Take 300 mg by  mouth daily. 03/17/20  Yes Gorsuch, Ni, MD  ALPRAZolam Duanne Moron) 0.5 MG tablet Take 0.5 mg by mouth 2 (two) times daily as needed for anxiety.   Yes [provider]  aspirin EC 81 MG tablet Take 81 mg by mouth daily. Swallow whole.   Yes [provider]  b complex vitamins tablet Take 1 tablet by mouth daily.    Yes [provider]  benzonatate (TESSALON) 100 MG capsule TAKE TWO CAPSULES BY MOUTH THREE TIMES A DAY AS NEEDED Patient taking differently: Take 200 mg by mouth 3 (three) times daily as needed for cough. 06/24/20  Yes Spero Geralds, MD  Cholecalciferol 25 MCG (1000 UT) tablet Take 1,000 Units by mouth daily.    Yes [provider]  diphenoxylate-atropine (LOMOTIL) 2.5-0.025 MG tablet Take 1 tablet by mouth 4 (four) times daily as needed for diarrhea or loose stools. 04/03/20  Yes Gorsuch, Ni, MD  famotidine (PEPCID) 20 MG tablet TAKE 1 TABLET BY MOUTH TWICE A DAY Patient taking differently: Take 20 mg by mouth 2 (two) times daily. 02/18/20  Yes Gorsuch, Ni, MD  fluconazole (DIFLUCAN) 200 MG tablet Take 200 mg by mouth See admin instructions. Qd x 10 days 07/17/20  Yes [provider]  HYDROcodone-acetaminophen (NORCO) 10-325 MG tablet Take 1 tablet by mouth every 6 (six) hours as needed for moderate pain.   Yes [provider]  lidocaine-prilocaine (EMLA) cream Apply 1 application topically as needed (prior chemo/IBIG).   Yes [provider]  meclizine (ANTIVERT) 12.5 MG tablet Take 12.5 mg by mouth 3 (three) times daily as needed for dizziness.   Yes [provider]  metFORMIN (GLUCOPHAGE-XR) 500 MG 24 hr tablet Take 500 mg by mouth every evening.   Yes [provider]  midodrine (PROAMATINE) 5 MG tablet Take 1.5 tablets (7.5 mg total) by mouth 3 (three) times daily with  meals. 07/08/20  Yes Pemberton, Greer Ee, MD  montelukast (SINGULAIR) 10 MG tablet TAKE 1 TABLET BY MOUTH EVERY DAY Patient taking differently: Take  10 mg by mouth daily. 07/14/20  Yes Spero Geralds, MD  nitroGLYCERIN (NITROSTAT) 0.4 MG SL tablet PLACE 1 TABLET UNDER THE TONGUE EVERY 5 MINUTES X 3 DOSES AS NEEDED FOR CHEST PAIN *MAX 3 DOSES* Patient taking differently: Place 0.4 mg under the tongue every 5 (five) minutes as needed for chest pain. 10/03/19  Yes Burtis Junes, NP  pregabalin (LYRICA) 200 MG capsule TAKE 1 CAPSULE (200 MG TOTAL) BY MOUTH 3 (THREE) TIMES DAILY. 09/02/19  Yes Suzzanne Cloud, NP  rosuvastatin (CRESTOR) 5 MG tablet Take 1 tablet (5 mg total) by mouth daily. 07/09/20  Yes Freada Bergeron, MD  sildenafil (VIAGRA) 100 MG tablet Take 0.5 tablets (50 mg total) by mouth as needed for erectile dysfunction (30 mintues prior to sexual intercourse). 04/08/20  Yes Burtis Junes, NP  tamsulosin (FLOMAX) 0.4 MG CAPS capsule Take 1 capsule (0.4 mg total) by mouth daily. 12/06/19  Yes Charlynne Cousins, MD  zolpidem (AMBIEN) 10 MG tablet Take 10 mg by mouth at bedtime as needed for sleep. 03/09/20  Yes [provider]    Physical Exam: Vitals:   07/18/20 0815 07/18/20 0832 07/18/20 1137 07/18/20 1634  BP: (!) 100/51  (!) 90/53 (!) 95/58  Pulse: 72  (!) 58 (!) 59  Resp: 17  17 17   Temp:  97.9 F (36.6 C) 98.3 F (36.8 C) 97.7 F (36.5 C)  TempSrc:  Oral Oral Oral  SpO2: 98%  99% 95%     . General:  Appears calm and comfortable and is in NAD, mildly fatigued . Eyes:   EOMI, normal lids, iris . ENT:  grossly normal hearing, lips & tongue, mmm . Neck:  no LAD, masses or thyromegaly . Cardiovascular:  RRR, no m/r/g. No LE edema.  Marland Kitchen Respiratory:   CTA bilaterally with no wheezes/rales/rhonchi.  Normal respiratory effort. . Abdomen:  soft, NT, ND, suprapubic cath in place . Skin:  no rash or induration seen on limited exam . Musculoskeletal:  grossly normal tone BUE/BLE, good ROM, no bony abnormality . Psychiatric:  grossly normal mood and affect, speech fluent and appropriate, AOx3 . Neurologic:  CN  2-12 grossly intact, moves all extremities in coordinated fashion    Radiological Exams on Admission: Independently reviewed - see discussion in A/P where applicable  CT HEAD WO CONTRAST  Result Date: 07/18/2020 CLINICAL DATA:  Encephalopathy EXAM: CT HEAD WITHOUT CONTRAST TECHNIQUE: Contiguous axial images were obtained from the base of the skull through the vertex without intravenous contrast. COMPARISON:  None. FINDINGS: Brain: There is no mass, hemorrhage or extra-axial collection. The size and configuration of the ventricles and extra-axial CSF spaces are normal. There is hypoattenuation of the white matter, most commonly indicating chronic small vessel disease. Vascular: Atherosclerotic calcification of the internal carotid arteries at the skull base. No abnormal hyperdensity of the major intracranial arteries or dural venous sinuses. Skull: The visualized skull base, calvarium and extracranial soft tissues are normal. Sinuses/Orbits: Ethmoid and maxillary chronic sinusitis. The orbits are normal. IMPRESSION: 1. No acute intracranial abnormality. 2. Chronic sinusitis. Electronically Signed   By: Ulyses Jarred M.D.   On: 07/18/2020 02:46   DG Chest Port 1 View  Result Date: 07/18/2020 CLINICAL DATA:  Altered mental status EXAM: PORTABLE CHEST 1 VIEW COMPARISON:  02/15/2020 FINDINGS: The heart size and mediastinal contours  are within normal limits. Both lungs are clear. The visualized skeletal structures are unremarkable. There is a right chest wall power-injectable Port-A-Cath with tip at the cavoatrial junction via a right internal jugular vein approach. IMPRESSION: No active disease. Electronically Signed   By: Ulyses Jarred M.D.   On: 07/18/2020 01:57    EKG: Independently reviewed.  NSR with rate 65; no evidence of acute ischemia   Labs on Admission: I have personally reviewed the available labs and imaging studies at the time of the admission.  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 Louis Meckel kindly came in and replaced the catheter; will need replacement again in 4 weeks -He recommends at least 30 days of antibiotic treatment assuming Pseudomonas UTI recurrence  Chronic hypotension -Patient with recurrent hypotension, on Midodrine -Missed AM dose (it fell in the floor) -Currently low suspicion for sepsis -Continue Midodrine  CLL -Continue Calquence (MAB therapy) -Continue Acyclovir -Continue IVIG -Followed by Dr. Alvy Bimler  Dysphagia -Started on Fluconazole by Dr. Benson Norway, will continue  HLD -Continue Crestor  PVD -Continue ASA  DM -Prior A1c was 4.5 -hold Glucophage -Cover with sensitive-scale SSI  Pancytopenia -Appears very stable at this time -Recheck CBC in AM  COPD -Continue Singulair, Tessalon perles  Chronic diastolic CHF -57/8/46 echo with grade 1 diastolic dysfunction -Appears to be compensated at this time  Anxiety/Chronic pain -I have reviewed this patient in the [place] Controlled Substances Reporting System.  He is receiving medications from only one provider  and appears to be taking them as prescribed. -He is at high risk of opioid misuse, diversion, or overdose. -Continue Xanax, Lyrica, Ambien    Note: This patient has been tested and is negative for the novel coronavirus COVID-19. The patient has been fully vaccinated against COVID-19.   Level of care: Telemetry Medical DVT prophylaxis: Lovenox  Code Status: DNR - confirmed with patient/family Family Communication: Wife was present throughout evaluation. Disposition Plan:  The patient is from: home  Anticipated d/c is to: home without [facility] services  Anticipated d/c date will depend on clinical response to treatment, likely 2-3 days  Patient is currently: acutely ill Consults called: Urology; Nutrition  Admission status:  Admit - It is my clinical opinion that admission to INPATIENT is reasonable and necessary because of the expectation that this patient will require hospital care that crosses at least 2 midnights to treat this condition based on the medical complexity of the problems presented.  Given the aforementioned information, the predictability of an adverse outcome is felt to be significant.    Karmen Bongo MD [facility]   How to contact the [facility] Attending or Consulting provider [place] or covering provider during  after hours 7P -7A, for this patient?  1. Check the care team in [facility] and look for a) attending/consulting [facility] provider listed and b) the [facility] team listed 2. Log into [email or web address] and use [place]'s universal password to access. If you do not have the password, please contact the hospital operator. 3. Locate the [facility] provider you are looking for under [facility] and page to a number that you can be directly reached. 4. If you still have difficulty reaching the provider, please page the [facility] (Director on Call) for the Hospitalists listed on amion for assistance.   07/18/2020, 6:09 PM

## 2020-07-19 DIAGNOSIS — I9589 Other hypotension: Secondary | ICD-10-CM

## 2020-07-19 DIAGNOSIS — I5032 Chronic diastolic (congestive) heart failure: Secondary | ICD-10-CM

## 2020-07-19 LAB — CBC
HCT: 33.7 % — ABNORMAL LOW (ref 39.0–52.0)
Hemoglobin: 10.7 g/dL — ABNORMAL LOW (ref 13.0–17.0)
MCH: 30.7 pg (ref 26.0–34.0)
MCHC: 31.8 g/dL (ref 30.0–36.0)
MCV: 96.6 fL (ref 80.0–100.0)
Platelets: 68 10*3/uL — ABNORMAL LOW (ref 150–400)
RBC: 3.49 MIL/uL — ABNORMAL LOW (ref 4.22–5.81)
RDW: 18.5 % — ABNORMAL HIGH (ref 11.5–15.5)
WBC: 4.6 10*3/uL (ref 4.0–10.5)
nRBC: 0 % (ref 0.0–0.2)

## 2020-07-19 LAB — PROCALCITONIN: Procalcitonin: <0.1 ng/mL

## 2020-07-19 LAB — BASIC METABOLIC PANEL
Anion gap: 6 (ref 5–15)
BUN: 10 mg/dL (ref 8–23)
CO2: 23 mmol/L (ref 22–32)
Calcium: 8.3 mg/dL — ABNORMAL LOW (ref 8.9–10.3)
Chloride: 112 mmol/L — ABNORMAL HIGH (ref 98–111)
Creatinine, Ser: 0.77 mg/dL (ref 0.61–1.24)
GFR, Estimated: >60 mL/min (ref >60)
Glucose, Bld: 82 mg/dL (ref 70–99)
Potassium: 4 mmol/L (ref 3.5–5.1)
Sodium: 141 mmol/L (ref 135–145)

## 2020-07-19 LAB — GLUCOSE, CAPILLARY
Glucose-Capillary: 82 mg/dL (ref 70–99)
Glucose-Capillary: 78 mg/dL (ref 70–99)
Glucose-Capillary: 112 mg/dL — ABNORMAL HIGH (ref 70–99)
Glucose-Capillary: 107 mg/dL — ABNORMAL HIGH (ref 70–99)

## 2020-07-19 MED ORDER — MAGIC MOUTHWASH
5.0000 mL | Freq: Four times a day (QID) | ORAL | Status: DC
  Administered 2020-07-19 – 2020-07-20 (×4): 5 mL via ORAL
  Filled 2020-07-19 (×7): qty 5

## 2020-07-19 MED ORDER — SODIUM CHLORIDE 0.9 % IV SOLN: INTRAVENOUS | Status: DC

## 2020-07-19 MED ORDER — SODIUM CHLORIDE 0.9 % IV SOLN: INTRAVENOUS | Status: AC

## 2020-07-19 NOTE — Progress Notes (Signed)
PROGRESS NOTE    BOOMER WINDERS  IDP:[id number] DOB: 09/14/1950 DOA: 07/18/2020 PCP: [name]., PA-C   Chief Complaint  Patient presents with  . Weakness    Brief Narrative:    HPI: Ryan Copeland is a 70 y.o. male with medical history significant of PVD/CAD s/p stent; HLD; DM; COPD; chronic hypotension on midodrine; and CLL presenting with fatigue.  He has no clue why he came to the hospital.  He denies feeling weak or tired, reports feeling pretty good.  No urinary symptoms - he has a suprapubic catheter in place, due for exchange on Tuesday.   His wife reports that he slept most of the day yesterday.  He fell in the kitchen floor and it was hard to get him up.  Last night, they emptied his catheter and he went back into the kitchen and was unresponsive on the bar stool, no control over hands and arms.  His BP was low, 70/56 and he was nonverbal.  He did not fall off the bar stool.  She called 911.  She was not concerned about infection, no fevers at home.  Yesterday AM he complained of not feeling good and being tired.  He slept all day - which is mildly abnormal.    He reports that the nurse gave him his midodrine this AM but he dropped it in the floor and was unable to take it; he is concerned that she charted it as having been given.    His COVID vaccine "started all of this".  They had their first shot in August and he "down" for 3-5 days.  He went for his second and was down for 3 weeks.  He went to his PCP and ended up hospitalized with PNA.  In November, he had another infection.  In January he had sepsis of uncertain source.  He had suprapubic catheter placed in March.  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  2 years later he had chemo again and IVIG monthly after.  Now he is on MAB therapy and IVIG monthly for energy.   Urine culture on 02/15/20 + for Pseudomonas, resistant to Cipro.    ED Course:   Carryover, per Dr. Nevada Crane:  Fatigue, worse in the past 3 days. No other reported symptoms.   Patient is very sleepy in the ED which prompted EDP to obtain VBG. Venous blood gas revealed pH 7.393, PCO2 38 on 3 L [place].   Work-up in the ED revealed UTI. Last urine culture grew Pseudomonas aeruginosa with resistance to ciprofloxacin and sensitivity to ceftazidime. Started on Rocephin in the ED, switching to IV Ceftazidime. Follow cultures for ID and sensitivities.  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 Louis Meckel kindly came in and replaced the  catheter; will need replacement again in 4 weeks -He recommends at least 30 days of antibiotic treatment assuming Pseudomonas UTI recurrence -Urine culture growing Pseudomonas aeruginosa, continue with IV antibiotics  Chronic hypotension -Continue with Midodrin -We will start on IV fluids  CLL -Continue Calquence (MAB therapy) -Continue Acyclovir -Continue IVIG -Followed by Dr. Alvy Bimler  Dysphagia -Started on Fluconazole by Dr. Benson Norway, will continue -We will add Magic mouthwash during hospital stay  HLD -Continue Crestor  PVD -Continue ASA  DM -Prior A1c was 4.5 -hold Glucophage -Cover with sensitive-scale SSI  Pancytopenia -Appears  very stable at this time -Recheck CBC in AM -When low platelet count will discontinue Lovenox and continue only SCDs for DVT prophylaxis  COPD -Continue Singulair, Tessalon perles  Chronic diastolic CHF -46/5/68 echo with grade 1 diastolic dysfunction -Appears to be compensated at this time  Anxiety/Chronic pain -I have reviewed this patient in the [place] Controlled Substances Reporting System.  He is receiving medications from only one provider and appears to be taking them as prescribed. -He is at high risk of opioid misuse, diversion, or overdose. -Continue Xanax, Lyrica, Ambien      DVT prophylaxis: Sinew with SCDs, no chemical prophylaxis due to thrombocytopenia Code Status: DNR Family Communication: None at bedside Disposition:   Status is: Inpatient  Remains inpatient appropriate because:IV treatments appropriate due to intensity of illness or inability to take PO   Dispo: The patient is from: Home              Anticipated d/c is to: Home              Patient currently is not medically stable to d/c.   Difficult to place patient No       Consultants:   urology  Subjective:  Reports poor appetite, denies any fever or chills, no chest pain or shortness of breath  Objective: Vitals:   07/18/20 1137 07/18/20 1634 07/18/20 2012 07/19/20 0416  BP: (!) 90/53 (!) 95/58 (!) 98/53 (!) 112/58  Pulse: (!) 58 (!) 59    Resp: 17 17 19 16   Temp: 98.3 F (36.8 C) 97.7 F (36.5 C) 97.8 F (36.6 C) 98.1 F (36.7 C)  TempSrc: Oral Oral Oral Oral  SpO2: 99% 95% 95% 95%    Intake/Output Summary (Last 24 hours) at 07/19/2020 1256 Last data filed at 07/19/2020 1124 Gross per 24 hour  Intake 720 ml  Output 2350 ml  Net -1630 ml   There were no vitals filed for this visit.  Examination:  Awake Alert, Oriented X 3, No new F.N deficits, Normal affect, extremely frail and deconditioned Symmetrical Chest wall movement, Good air movement bilaterally, CTAB RRR,No  Gallops,Rubs or new Murmurs, No Parasternal Heave +ve B.Sounds, Abd Soft, No tenderness, No rebound - guarding or rigidity.  Suprapubic Foley catheter present No Cyanosis, Clubbing or edema, No new Rash or bruise       Data Reviewed: I have personally reviewed following labs and imaging studies  CBC: Recent Labs  Lab 07/18/20 0148 07/18/20 0315 07/19/20 0046  WBC  --  6.2 4.6  NEUTROABS  --  3.3  --   HGB 14.3 11.7* 10.7*  HCT 42.0 37.0* 33.7*  MCV  --  97.1 96.6  PLT  --  76* 68*    Basic Metabolic Panel: Recent Labs  Lab 07/18/20 0148 07/18/20 0315 07/19/20 0046  NA 139 140 141  K 4.8 4.2 4.0  CL  --  107 112*  CO2  --  23 23  GLUCOSE  --  75 82  BUN  --  10 10  CREATININE  --  0.77 0.77  CALCIUM  --  8.8* 8.3*    GFR: Estimated Creatinine Clearance: 89.5 mL/min (by C-G formula based on SCr of 0.77 mg/dL).  Liver Function Tests: Recent Labs  Lab 07/18/20 0315  AST 14*  ALT 14  ALKPHOS 101  BILITOT 0.6  PROT 5.4*  ALBUMIN 3.2*    CBG: Recent Labs  Lab 07/18/20 1151 07/18/20 1636 07/18/20 2030 07/19/20 0744 07/19/20 1225  GLUCAP 85 114* 89 82 78     Recent Results (from the past 240 hour(s))  Urine culture     Status: Abnormal (Preliminary result)   Collection Time: 07/18/20  1:01 AM   Specimen: Urine, Random  Result Value Ref Range Status   Specimen Description URINE, RANDOM  Final   Special Requests NONE  Final   Culture (A)  Final    >=100,000 COLONIES/mL PSEUDOMONAS AERUGINOSA SUSCEPTIBILITIES TO FOLLOW Performed at [facility], [place] [address] [place], [place] [address]    Report Status PENDING  Incomplete  Resp Panel by RT-PCR (Flu A&B, Covid) Nasopharyngeal Swab     Status: None   Collection Time: 07/18/20  5:28 AM   Specimen: Nasopharyngeal Swab; Nasopharyngeal(NP) swabs in vial transport medium  Result Value Ref Range Status   SARS Coronavirus 2 by RT PCR NEGATIVE NEGATIVE Final    Comment: (NOTE) SARS-CoV-2 target  nucleic acids are NOT DETECTED.  The SARS-CoV-2 RNA is generally detectable in upper respiratory specimens during the acute phase of infection. The lowest concentration of SARS-CoV-2 viral copies this assay can detect is 138 copies/mL. A negative result does not preclude SARS-Cov-2 infection and should not be used as the sole basis for treatment or other patient management decisions. A negative result may occur with  improper specimen collection/handling, submission of specimen other than nasopharyngeal swab, presence of viral mutation(s) within the areas targeted by this assay, and inadequate number of viral copies(<138 copies/mL). A negative result must be combined with clinical observations, patient history, and epidemiological information. The expected result is Negative.  Fact Sheet for Patients:  EntrepreneurPulse.com.au  Fact Sheet for Healthcare Providers:  IncredibleEmployment.be  This test is no t yet approved or cleared by the [place] FDA and  has been authorized for detection and/or diagnosis of SARS-CoV-2 by FDA under an Emergency Use Authorization (EUA). This EUA will remain  in effect (meaning this test can be used) for the duration of the COVID-19 declaration under Section 564(b)(1) of the Act, 21 U.S.C.section 360bbb-3(b)(1), unless the authorization is terminated  or revoked sooner.       Influenza A by PCR NEGATIVE NEGATIVE Final   Influenza B by PCR NEGATIVE NEGATIVE Final    Comment: (NOTE) The Xpert Xpress SARS-CoV-2/FLU/RSV plus assay is intended as an aid in the diagnosis of influenza from Nasopharyngeal swab specimens and should not be used as a sole basis for treatment. Nasal washings and aspirates are unacceptable for Xpert Xpress SARS-CoV-2/FLU/RSV testing.  Fact Sheet for Patients: EntrepreneurPulse.com.au  Fact Sheet for Healthcare  Providers: IncredibleEmployment.be  This test is not yet approved or cleared by the [place] FDA and has been authorized for detection and/or diagnosis of SARS-CoV-2 by FDA under an Emergency Use Authorization (EUA). This EUA will remain in effect (meaning this test can be used) for the duration of the COVID-19 declaration under Section 564(b)(1) of the Act, 21 U.S.C. section 360bbb-3(b)(1), unless  the authorization is terminated or revoked.  Performed at [facility], [place] [address] [place], [place] [address]          Radiology Studies: CT HEAD WO CONTRAST  Result Date: 07/18/2020 CLINICAL DATA:  Encephalopathy EXAM: CT HEAD WITHOUT CONTRAST TECHNIQUE: Contiguous axial images were obtained from the base of the skull through the vertex without intravenous contrast. COMPARISON:  None. FINDINGS: Brain: There is no mass, hemorrhage or extra-axial collection. The size and configuration of the ventricles and extra-axial CSF spaces are normal. There is hypoattenuation of the white matter, most commonly indicating chronic small vessel disease. Vascular: Atherosclerotic calcification of the internal carotid arteries at the skull base. No abnormal hyperdensity of the major intracranial arteries or dural venous sinuses. Skull: The visualized skull base, calvarium and extracranial soft tissues are normal. Sinuses/Orbits: Ethmoid and maxillary chronic sinusitis. The orbits are normal. IMPRESSION: 1. No acute intracranial abnormality. 2. Chronic sinusitis. Electronically Signed   By: Ulyses Jarred M.D.   On: 07/18/2020 02:46   DG Chest Port 1 View  Result Date: 07/18/2020 CLINICAL DATA:  Altered mental status EXAM: PORTABLE CHEST 1 VIEW COMPARISON:  02/15/2020 FINDINGS: The heart size and mediastinal contours are within normal limits. Both lungs are clear. The visualized skeletal structures are unremarkable. There is a right chest wall power-injectable Port-A-Cath with tip  at the cavoatrial junction via a right internal jugular vein approach. IMPRESSION: No active disease. Electronically Signed   By: Ulyses Jarred M.D.   On: 07/18/2020 01:57        Scheduled Meds: . acalabrutinib  100 mg Oral BID  . acyclovir  400 mg Oral BID  . allopurinol  300 mg Oral Daily  . aspirin EC  81 mg Oral Daily  . Chlorhexidine Gluconate Cloth  6 each Topical Daily  . famotidine  20 mg Oral BID  . fluconazole  200 mg Oral Daily  . insulin aspart  0-9 Units Subcutaneous TID WC  . midodrine  10 mg Oral TID WC  . montelukast  10 mg Oral Daily  . pregabalin  200 mg Oral TID  . rosuvastatin  5 mg Oral Daily  . sodium chloride flush  10-40 mL Intracatheter Q12H  . sodium chloride flush  3 mL Intravenous Q12H  . tamsulosin  0.4 mg Oral Daily   Continuous Infusions: . cefTAZidime (FORTAZ)  IV 1 g (07/19/20 0536)  . lactated ringers 75 mL/hr at 07/19/20 0353     LOS: 1 day       Phillips Climes, MD [facility]   To contact the attending provider between 7A-7P or the covering provider during after hours 7P-7A, please log into the web site [email or web address] and access using universal [place] password for that web site. If you do not have the password, please call the hospital operator.  07/19/2020, 12:56 PM

## 2020-07-20 DIAGNOSIS — I739 Peripheral vascular disease, unspecified: Secondary | ICD-10-CM

## 2020-07-20 DIAGNOSIS — R8271 Bacteriuria: Secondary | ICD-10-CM

## 2020-07-20 DIAGNOSIS — E785 Hyperlipidemia, unspecified: Secondary | ICD-10-CM

## 2020-07-20 DIAGNOSIS — D61818 Other pancytopenia: Secondary | ICD-10-CM

## 2020-07-20 DIAGNOSIS — I959 Hypotension, unspecified: Secondary | ICD-10-CM

## 2020-07-20 DIAGNOSIS — I509 Heart failure, unspecified: Secondary | ICD-10-CM

## 2020-07-20 DIAGNOSIS — E119 Type 2 diabetes mellitus without complications: Secondary | ICD-10-CM

## 2020-07-20 DIAGNOSIS — J449 Chronic obstructive pulmonary disease, unspecified: Secondary | ICD-10-CM

## 2020-07-20 DIAGNOSIS — N390 Urinary tract infection, site not specified: Secondary | ICD-10-CM

## 2020-07-20 DIAGNOSIS — C8308 Small cell B-cell lymphoma, lymph nodes of multiple sites: Secondary | ICD-10-CM

## 2020-07-20 LAB — BASIC METABOLIC PANEL
Anion gap: 4 — ABNORMAL LOW (ref 5–15)
BUN: 10 mg/dL (ref 8–23)
CO2: 26 mmol/L (ref 22–32)
Calcium: 8.4 mg/dL — ABNORMAL LOW (ref 8.9–10.3)
Chloride: 112 mmol/L — ABNORMAL HIGH (ref 98–111)
Creatinine, Ser: 0.79 mg/dL (ref 0.61–1.24)
GFR, Estimated: >60 mL/min (ref >60)
Glucose, Bld: 89 mg/dL (ref 70–99)
Potassium: 4.1 mmol/L (ref 3.5–5.1)
Sodium: 142 mmol/L (ref 135–145)

## 2020-07-20 LAB — CBC
HCT: 33.5 % — ABNORMAL LOW (ref 39.0–52.0)
Hemoglobin: 10.8 g/dL — ABNORMAL LOW (ref 13.0–17.0)
MCH: 31 pg (ref 26.0–34.0)
MCHC: 32.2 g/dL (ref 30.0–36.0)
MCV: 96.3 fL (ref 80.0–100.0)
Platelets: 61 10*3/uL — ABNORMAL LOW (ref 150–400)
RBC: 3.48 MIL/uL — ABNORMAL LOW (ref 4.22–5.81)
RDW: 18.4 % — ABNORMAL HIGH (ref 11.5–15.5)
WBC: 3.2 10*3/uL — ABNORMAL LOW (ref 4.0–10.5)
nRBC: 0 % (ref 0.0–0.2)

## 2020-07-20 LAB — URINE CULTURE: Culture: >=100000 — AB

## 2020-07-20 LAB — GLUCOSE, CAPILLARY
Glucose-Capillary: 89 mg/dL (ref 70–99)
Glucose-Capillary: 86 mg/dL (ref 70–99)

## 2020-07-20 LAB — MAGNESIUM: Magnesium: 1.7 mg/dL (ref 1.7–2.4)

## 2020-07-20 MED ORDER — ENSURE ENLIVE PO LIQD: 237.0000 mL | Freq: Two times a day (BID) | ORAL | Status: DC

## 2020-07-20 MED ORDER — MAGNESIUM SULFATE IN D5W 1-5 GM/100ML-% IV SOLN
1.0000 g | Freq: Once | INTRAVENOUS | Status: AC
  Administered 2020-07-20: 1 g via INTRAVENOUS
  Filled 2020-07-20: qty 100

## 2020-07-20 MED ORDER — HEPARIN SOD (PORK) LOCK FLUSH 100 UNIT/ML IV SOLN
500.0000 [IU] | INTRAVENOUS | Status: AC | PRN
Start: 2020-07-20 — End: 2020-07-20
  Administered 2020-07-20: 500 [IU]
  Filled 2020-07-20: qty 5

## 2020-07-20 MED ORDER — ADULT MULTIVITAMIN W/MINERALS CH
1.0000 | ORAL_TABLET | Freq: Every day | ORAL | Status: DC
  Administered 2020-07-20: 1 via ORAL
  Filled 2020-07-20: qty 1

## 2020-07-20 NOTE — Progress Notes (Signed)
PROGRESS NOTE    Ryan Copeland  ZOX:[id number] DOB: January 28, 1951 DOA: 07/18/2020 PCP: [name]., PA-C   Chief Complaint  Patient presents with  . Weakness    Brief Narrative:    HPI: Ryan Copeland is a 70 y.o. male with medical history significant of PVD/CAD s/p stent; HLD; DM; COPD; chronic hypotension on midodrine; and CLL presenting with fatigue.  He has no clue why he came to the hospital.  He denies feeling weak or tired, reports feeling pretty good.  No urinary symptoms - he has a suprapubic catheter in place, due for exchange on Tuesday.   His wife reports that he slept most of the day yesterday.  He fell in the kitchen floor and it was hard to get him up.  Last night, they emptied his catheter and he went back into the kitchen and was unresponsive on the bar stool, no control over hands and arms.  His BP was low, 70/56 and he was nonverbal.  He did not fall off the bar stool.  She called 911.  She was not concerned about infection, no fevers at home.  Yesterday AM he complained of not feeling good and being tired.  He slept all day - which is mildly abnormal.    He reports that the nurse gave him his midodrine this AM but he dropped it in the floor and was unable to take it; he is concerned that she charted it as having been given.    His COVID vaccine "started all of this".  They had their first shot in August and he "down" for 3-5 days.  He went for his second and was down for 3 weeks.  He went to his PCP and ended up hospitalized with PNA.  In November, he had another infection.  In January he had sepsis of uncertain source.  He had suprapubic catheter placed in March.  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  2 years later he had chemo again and IVIG monthly after.  Now he is on MAB therapy and IVIG monthly for energy.   Urine culture on 02/15/20 + for Pseudomonas, resistant to Cipro.    ED Course:   Carryover, per Dr. Nevada Crane:  Fatigue, worse in the past 3 days. No other reported symptoms.   Patient is very sleepy in the ED which prompted EDP to obtain VBG. Venous blood gas revealed pH 7.393, PCO2 38 on 3 L [place].   Work-up in the ED revealed UTI. Last urine culture grew Pseudomonas aeruginosa with resistance to ciprofloxacin and sensitivity to ceftazidime. Started on Rocephin in the ED, switching to IV Ceftazidime. Follow cultures for ID and sensitivities.  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 Louis Meckel kindly came in and replaced the  catheter; will need replacement again in 4 weeks -He recommends at least 30 days of antibiotic treatment assuming Pseudomonas UTI recurrence -Urine culture growing Pseudomonas aeruginosa, continue with IV antibiotics, urine culture growing pansensitive Pseudomonas, will consult ID regarding further recommendations.  Chronic hypotension -Continue with Midodrin -Continue with IV fluids  CLL -Continue Calquence (MAB therapy) -Continue Acyclovir -Continue IVIG -Followed by Dr. Alvy Bimler  Dysphagia -Started on Fluconazole by Dr. Benson Norway, will continue -We will add Magic mouthwash during hospital stay  HLD -Continue Crestor  PVD -Continue  ASA  DM -Prior A1c was 4.5 -hold Glucophage -Cover with sensitive-scale SSI  Pancytopenia -Appears very stable at this time -Recheck CBC in AM -When low platelet count will discontinue Lovenox and continue only SCDs for DVT prophylaxis  COPD -Continue Singulair, Tessalon perles  Chronic diastolic CHF -80/3/21 echo with grade 1 diastolic dysfunction -Appears to be compensated at this time  Anxiety/Chronic pain -I have reviewed this patient in the [place] Controlled Substances Reporting System.  He is receiving medications from only one provider and appears to be taking them as prescribed. -He is at high risk of opioid misuse, diversion, or overdose. -Continue Xanax, Lyrica, Ambien      DVT prophylaxis: Sinew with SCDs, no chemical prophylaxis due to thrombocytopenia Code Status: DNR Family Communication: None at bedside Disposition:   Status is: Inpatient  Remains inpatient appropriate because:IV treatments appropriate due to intensity of illness or inability to take PO   Dispo: The patient is from: Home              Anticipated d/c is to: Home              Patient currently is not medically stable to d/c.   Difficult to place patient No       Consultants:   urology  Subjective:  Report he is feeling better today, feeling his new suprapubic cath is kinked a lot which is leading to urinary retention, reports generalized weakness has improved as well.     Objective: Vitals:   07/19/20 1344 07/19/20 2009 07/20/20 0419 07/20/20 1152  BP: (!) 105/55 (!) 111/55 (!) 102/59 (!) 110/52  Pulse: (!) 51   60  Resp: 20 19 18 15   Temp: 98.3 F (36.8 C) 98.3 F (36.8 C) 98.1 F (36.7 C) 97.8 F (36.6 C)  TempSrc: Oral Oral Oral Oral  SpO2: 98% 95% 95% 96%    Intake/Output Summary (Last 24 hours) at 07/20/2020 1254 Last data filed at 07/20/2020 1112 Gross per 24 hour  Intake 3218.04 ml  Output 2100 ml  Net 1118.04 ml   There were no vitals filed for this  visit.  Examination:  Awake Alert, Oriented X 3, No new F.N deficits, Normal affect, frail and deconditioned Symmetrical Chest wall movement, Good air movement bilaterally, CTAB RRR,No Gallops,Rubs or new Murmurs, No Parasternal Heave +ve B.Sounds, Abd Soft, No tenderness, No rebound - guarding or rigidity. No Cyanosis, Clubbing or edema, No new Rash or bruise       Data Reviewed: I have personally reviewed following labs and imaging studies  CBC: Recent Labs  Lab 07/18/20 0148 07/18/20 0315 07/19/20 0046 07/20/20 0131  WBC  --  6.2 4.6 3.2*  NEUTROABS  --  3.3  --   --   HGB 14.3 11.7* 10.7* 10.8*  HCT 42.0 37.0* 33.7* 33.5*  MCV  --  97.1 96.6 96.3  PLT  --  76* 68* 61*    Basic Metabolic Panel: Recent Labs  Lab 07/18/20 0148 07/18/20 0315 07/19/20 0046 07/20/20 0131  NA 139 140 141 142  K 4.8 4.2 4.0 4.1  CL  --  107 112* 112*  CO2  --  23 23 26   GLUCOSE  --  75 82 89  BUN  --  10 10 10   CREATININE  --  0.77 0.77 0.79  CALCIUM  --  8.8* 8.3* 8.4*  MG  --   --   --  1.7    GFR: Estimated Creatinine Clearance: 89.5 mL/min (by C-G formula based on SCr of 0.79 mg/dL).  Liver Function Tests: Recent Labs  Lab 07/18/20 0315  AST 14*  ALT 14  ALKPHOS 101  BILITOT 0.6  PROT 5.4*  ALBUMIN 3.2*    CBG: Recent Labs  Lab 07/19/20 1225 07/19/20 1646 07/19/20 2009 07/20/20 0757 07/20/20 1202  GLUCAP 78 112* 107* 89 86     Recent Results (from the past 240 hour(s))  Urine culture     Status: Abnormal   Collection Time: 07/18/20  1:01 AM   Specimen: Urine, Random  Result Value Ref Range Status   Specimen Description URINE, RANDOM  Final   Special Requests   Final    NONE Performed at [facility], [place] [address] [place], [place] [address]    Culture >=100,000 COLONIES/mL PSEUDOMONAS AERUGINOSA (A)  Final   Report Status 07/20/2020 FINAL  Final   Organism ID, Bacteria PSEUDOMONAS AERUGINOSA (A)  Final      Susceptibility   Pseudomonas  aeruginosa - MIC*    CEFTAZIDIME 8 SENSITIVE Sensitive     CIPROFLOXACIN <=0.25 SENSITIVE Sensitive     GENTAMICIN <=1 SENSITIVE Sensitive     IMIPENEM 1 SENSITIVE Sensitive     PIP/TAZO <=4 SENSITIVE Sensitive     CEFEPIME 2 SENSITIVE Sensitive     * >=100,000 COLONIES/mL PSEUDOMONAS AERUGINOSA  Blood Cultures (routine x 2)     Status: None (Preliminary result)   Collection Time: 07/18/20  1:07 AM   Specimen: BLOOD  Result Value Ref Range Status   Specimen Description BLOOD RIGHT ANTECUBITAL  Final   Special Requests   Final    BOTTLES DRAWN AEROBIC AND ANAEROBIC Blood Culture adequate volume   Culture   Final    NO GROWTH 2 DAYS Performed at [facility], [address], [place], [place] [address]    Report Status PENDING  Incomplete  Blood Cultures (routine x 2)     Status: None (Preliminary result)   Collection Time: 07/18/20  1:40 AM   Specimen: BLOOD  Result Value Ref Range Status   Specimen Description BLOOD RIGHT UPPER ARM  Final   Special Requests   Final    BOTTLES DRAWN AEROBIC AND ANAEROBIC Blood Culture results may not be optimal due to an inadequate volume of blood received in culture bottles   Culture   Final    NO GROWTH 2 DAYS Performed at [facility], [place] [address], [place], [place] [address]    Report Status PENDING  Incomplete  Resp Panel by RT-PCR (Flu A&B, Covid) Nasopharyngeal Swab     Status: None   Collection Time: 07/18/20  5:28 AM   Specimen: Nasopharyngeal Swab; Nasopharyngeal(NP) swabs in vial transport medium  Result Value Ref Range Status   SARS Coronavirus 2 by RT PCR NEGATIVE NEGATIVE Final    Comment: (NOTE) SARS-CoV-2 target nucleic acids are NOT DETECTED.  The SARS-CoV-2 RNA is generally detectable in upper respiratory specimens during  the acute phase of infection. The lowest concentration of SARS-CoV-2 viral copies this assay can detect is 138 copies/mL. A negative result does not preclude SARS-Cov-2 infection and should  not be used as the sole basis for treatment or other patient management decisions. A negative result may occur with  improper specimen collection/handling, submission of specimen other than nasopharyngeal swab, presence of viral mutation(s) within the areas targeted by this assay, and inadequate number of viral copies(<138 copies/mL). A negative result must be combined with clinical observations, patient history, and epidemiological information. The expected result is Negative.  Fact Sheet for Patients:  EntrepreneurPulse.com.au  Fact Sheet for Healthcare Providers:  IncredibleEmployment.be  This test is no t yet approved or cleared by the [place] FDA and  has been authorized for detection and/or diagnosis of SARS-CoV-2 by FDA under an Emergency Use Authorization (EUA). This EUA will remain  in effect (meaning this test can be used) for the duration of the COVID-19 declaration under Section 564(b)(1) of the Act, 21 U.S.C.section 360bbb-3(b)(1), unless the authorization is terminated  or revoked sooner.       Influenza A by PCR NEGATIVE NEGATIVE Final   Influenza B by PCR NEGATIVE NEGATIVE Final    Comment: (NOTE) The Xpert Xpress SARS-CoV-2/FLU/RSV plus assay is intended as an aid in the diagnosis of influenza from Nasopharyngeal swab specimens and should not be used as a sole basis for treatment. Nasal washings and aspirates are unacceptable for Xpert Xpress SARS-CoV-2/FLU/RSV testing.  Fact Sheet for Patients: EntrepreneurPulse.com.au  Fact Sheet for Healthcare Providers: IncredibleEmployment.be  This test is not yet approved or cleared by the [place] FDA and has been authorized for detection and/or diagnosis of SARS-CoV-2 by FDA under an Emergency Use Authorization (EUA). This EUA will remain in effect (meaning this test can be used) for the duration of the COVID-19 declaration under  Section 564(b)(1) of the Act, 21 U.S.C. section 360bbb-3(b)(1), unless the authorization is terminated or revoked.  Performed at [facility], [place] [address], [place], [place] [address]          Radiology Studies: No results found.      Scheduled Meds: . acalabrutinib  100 mg Oral BID  . acyclovir  400 mg Oral BID  . allopurinol  300 mg Oral Daily  . aspirin EC  81 mg Oral Daily  . Chlorhexidine Gluconate Cloth  6 each Topical Daily  . famotidine  20 mg Oral BID  . feeding supplement  237 mL Oral BID BM  . fluconazole  200 mg Oral Daily  . insulin aspart  0-9 Units Subcutaneous TID WC  . magic mouthwash  5 mL Oral QID  . midodrine  10 mg Oral TID WC  . montelukast  10 mg Oral Daily  . multivitamin with minerals  1 tablet Oral Daily  . pregabalin  200 mg Oral TID  . rosuvastatin  5 mg Oral Daily  . sodium chloride flush  10-40 mL Intracatheter Q12H  . sodium chloride flush  3 mL Intravenous Q12H  . tamsulosin  0.4 mg Oral Daily   Continuous Infusions: . cefTAZidime (FORTAZ)  IV 1 g (07/20/20 0541)  . lactated ringers 75 mL/hr at 07/20/20 1235     LOS: 2 days       Phillips Climes, MD [facility]   To contact the attending provider between 7A-7P or the covering provider during after hours 7P-7A, please log into the web site [email or web address] and access using universal [place] password  for that web site. If you do not have the password, please call the hospital operator.  07/20/2020, 12:54 PM

## 2020-07-20 NOTE — Consult Note (Signed)
[facility] for Infectious Disease    Date of Admission:  07/18/2020   Total days of antibiotics 3               Reason for Consult: Pseudomonas UTI    Referring Provider: Phillips Climes, MD Primary Care Provider: Bing Matter, PA  Assessment: Patient has multiple hospitalizations for UTI.  Urine culture in the past grew Pseudomonas resistant to ciprofloxacin.  Blood culture was negative at those admissions.  He was treated with cefepime and Zosyn in the past.  CT renal was negative for stone or mass.  Urine culture grew Pseudomonas pansensitive.  Blood culture negative to date.  He was started on ceftazidime for 3 days.  Urology has changed his catheter 2 days ago.  I, however, have low suspicion that his altered mental status is due to his UTI.  Patient has no clear urinary symptoms.  No evidence of pyelonephritis or sepsis on exam.  He is at risk for bacteria colonization secondary to his suprapubic catheter.  His UA and urine culture can be a result of colonization.  We recommend stopping antibiotic.  Patient will follow up with urology outpatient and will possibly need a cystoscopy.  Plan: 1. Stop ceftazidime 2. 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 acalabrutinib  100 mg Oral BID  . acyclovir  400 mg Oral BID  . allopurinol  300 mg Oral Daily  . aspirin EC  81 mg Oral Daily  . Chlorhexidine Gluconate Cloth  6 each Topical Daily  . famotidine  20 mg Oral BID  . feeding supplement  237 mL Oral BID BM  . fluconazole   200 mg Oral Daily  . insulin aspart  0-9 Units Subcutaneous TID WC  . magic mouthwash  5 mL Oral QID  . midodrine  10 mg Oral TID WC  . montelukast  10 mg Oral Daily  . multivitamin with minerals  1 tablet Oral Daily  . pregabalin  200 mg Oral TID  . rosuvastatin  5 mg Oral Daily  . sodium chloride flush  10-40 mL Intracatheter Q12H  . sodium chloride flush  3 mL Intravenous Q12H  . tamsulosin  0.4 mg Oral Daily   Continuous Infusions: . lactated ringers 75 mL/hr at 07/20/20 1235   PRN Meds:.acetaminophen **OR** acetaminophen, albuterol, ALPRAZolam, benzonatate, bisacodyl, diphenoxylate-atropine, hydrALAZINE, HYDROcodone-acetaminophen, meclizine, morphine injection, ondansetron **OR** ondansetron (ZOFRAN) IV, polyethylene glycol, sodium chloride flush, zolpidem  HPI: Ryan Copeland is a 70 y.o. male with past medical history of PVD, CAD, diabetes, COPD, CLL on chemotherapy who presented to the hospital for altered mental status, thought is due to UTI.  Patient is alert, awake and oriented during conversation today.  He is able to ambulate without assistance.  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  He was hospitalized in November 2021 for sepsis secondary to UTI and was treated with 5 days of cefepime.  He was admitted again in January 2022 for sepsis and was treated with Zosyn.  He has a suprapubic catheter placed in March 2022 and has been seeing urology every month.  Patient's wife helps clean his catheter and change pain to the bag.   He endorses suprapubic tenderness.  States that he noticed debris in his urine frequently.  He also endorses penile pain that has been going on since August.  Denies any blood or discharge from his urethra.  Review of Systems: Review of Systems  Constitutional: Negative for chills and fever.  Genitourinary: Negative for flank pain.    Past Medical History:  Diagnosis Date  . Anxiety 01/30/2014  . Back injury     lower disc  . CAD (coronary artery disease)   . CLL (chronic lymphocytic leukemia) ([place]) 03/18/2011  . COPD (chronic obstructive pulmonary disease) ([place])   . DM type 2 (diabetes mellitus, type 2) ([place])   . ECRB (extensor carpi radialis brevis) tenosynovitis   . Foley catheter in place 11/2019   last changed 04-14-2020  . GERD (gastroesophageal reflux disease)    takes Nexium if needed  . History of blood transfusion 2015  . Hyperlipidemia   . Insomnia   . 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 MI, acute, non ST segment elevation ([place]) 06/28/2009   with stenting of the LAD  . Neuromuscular disorder ([place])    peripheral neuropathy both all toes  . Pneumonia 11/2019  . PVD (peripheral vascular disease) ([place])   . Thrombocytopenia ([place])   . Tobacco abuse     Social History   Tobacco Use  . Smoking status: Former Smoker    Packs/day: 1.00    Years: 44.00    Pack years: 44.00    Types: Cigarettes    Quit date: 04/23/2016    Years since quitting: 4.2  . Smokeless tobacco: Former Network engineer  . Vaping Use: Never used  Substance Use Topics  . Alcohol use: Not Currently    Alcohol/week: 0.0 standard drinks  . Drug use: No    Family History  Problem Relation Age of Onset  . Lung cancer Mother 110  . Cancer Mother        lung  . Heart failure Father 46  . Heart disease Father    Allergies  Allergen Reactions  . Immune Globulin Hives and Rash    Pt reports hives/rash on body with IV Privigen. He was changed to IV Gamunex-C and had many infusions without adverse side effects.   . Codeine Hives    Pt states he can take a few, more reaction with extended doses.    OBJECTIVE: Blood pressure (!) 110/52, pulse 60, temperature 97.8 F (36.6 C), temperature source Oral, resp. rate 15, SpO2 96 %.  Physical Exam Constitutional:      General: He is not in acute distress.    Appearance: He is not toxic-appearing.     Comments: Patient  however, awake and oriented.  Able to ambulate without assistance.  Eyes:     General: No scleral icterus.       Right eye: No discharge.        Left eye: No discharge.     Conjunctiva/sclera: Conjunctivae normal.  Cardiovascular:     Rate and Rhythm: Normal rate and regular rhythm.     Heart sounds: Normal heart sounds. No murmur heard.   Pulmonary:     Effort: Pulmonary effort is normal. No respiratory distress.  Abdominal:     Tenderness: There is no  right CVA tenderness or left CVA tenderness.  Genitourinary:    Comments: Suprapubic catheter in place.  The surrounding skin is appear noninfected.  Clear yellow urine collected in the catheter.  Penis appears normal without any blood or discharge at the meatus. Skin:    General: Skin is warm.     Coloration: Skin is not jaundiced.  Neurological:     Mental Status: He is alert and oriented to person, place, and time. Mental status is at baseline.     Lab Results Lab Results  Component Value Date   WBC 3.2 (L) 07/20/2020   HGB 10.8 (L) 07/20/2020   HCT 33.5 (L) 07/20/2020   MCV 96.3 07/20/2020   PLT 61 (L) 07/20/2020    Lab Results  Component Value Date   CREATININE 0.79 07/20/2020   BUN 10 07/20/2020   NA 142 07/20/2020   K 4.1 07/20/2020   CL 112 (H) 07/20/2020   CO2 26 07/20/2020    Lab Results  Component Value Date   ALT 14 07/18/2020   AST 14 (L) 07/18/2020   ALKPHOS 101 07/18/2020   BILITOT 0.6 07/18/2020     Microbiology: Recent Results (from the past 240 hour(s))  Urine culture     Status: Abnormal   Collection Time: 07/18/20  1:01 AM   Specimen: Urine, Random  Result Value Ref Range Status   Specimen Description URINE, RANDOM  Final   Special Requests   Final    NONE Performed at [facility], [address], [place], [place] [address]    Culture >=100,000 COLONIES/mL PSEUDOMONAS AERUGINOSA (A)  Final   Report Status 07/20/2020 FINAL  Final   Organism ID, Bacteria PSEUDOMONAS AERUGINOSA  (A)  Final      Susceptibility   Pseudomonas aeruginosa - MIC*    CEFTAZIDIME 8 SENSITIVE Sensitive     CIPROFLOXACIN <=0.25 SENSITIVE Sensitive     GENTAMICIN <=1 SENSITIVE Sensitive     IMIPENEM 1 SENSITIVE Sensitive     PIP/TAZO <=4 SENSITIVE Sensitive     CEFEPIME 2 SENSITIVE Sensitive     * >=100,000 COLONIES/mL PSEUDOMONAS AERUGINOSA  Blood Cultures (routine x 2)     Status: None (Preliminary result)   Collection Time: 07/18/20  1:07 AM   Specimen: BLOOD  Result Value Ref Range Status   Specimen Description BLOOD RIGHT ANTECUBITAL  Final   Special Requests   Final    BOTTLES DRAWN AEROBIC AND ANAEROBIC Blood Culture adequate volume   Culture   Final    NO GROWTH 2 DAYS Performed at [facility], [address], [place], [place] [address]    Report Status PENDING  Incomplete  Blood Cultures (routine x 2)     Status: None (Preliminary result)   Collection Time: 07/18/20  1:40 AM   Specimen: BLOOD  Result Value Ref Range Status   Specimen Description BLOOD RIGHT UPPER ARM  Final   Special Requests   Final    BOTTLES DRAWN AEROBIC AND ANAEROBIC Blood Culture results may not be optimal due to an inadequate volume of blood received in culture bottles   Culture   Final    NO GROWTH 2 DAYS Performed at [facility], [place] [address], [place], [place] [address]    Report Status PENDING  Incomplete  Resp Panel by RT-PCR (Flu A&B, Covid) Nasopharyngeal Swab     Status: None   Collection Time: 07/18/20  5:28 AM   Specimen: Nasopharyngeal Swab; Nasopharyngeal(NP) swabs in  vial transport medium  Result Value Ref Range Status   SARS Coronavirus 2 by RT PCR NEGATIVE NEGATIVE Final    Comment: (NOTE) SARS-CoV-2 target nucleic acids are NOT DETECTED.  The SARS-CoV-2 RNA is generally detectable in upper respiratory specimens during the acute phase of infection. The lowest concentration of SARS-CoV-2 viral copies this assay can detect is 138 copies/mL. A negative result does  not preclude SARS-Cov-2 infection and should not be used as the sole basis for treatment or other patient management decisions. A negative result may occur with  improper specimen collection/handling, submission of specimen other than nasopharyngeal swab, presence of viral mutation(s) within the areas targeted by this assay, and inadequate number of viral copies(<138 copies/mL). A negative result must be combined with clinical observations, patient history, and epidemiological information. The expected result is Negative.  Fact Sheet for Patients:  EntrepreneurPulse.com.au  Fact Sheet for Healthcare Providers:  IncredibleEmployment.be  This test is no t yet approved or cleared by the [place] FDA and  has been authorized for detection and/or diagnosis of SARS-CoV-2 by FDA under an Emergency Use Authorization (EUA). This EUA will remain  in effect (meaning this test can be used) for the duration of the COVID-19 declaration under Section 564(b)(1) of the Act, 21 U.S.C.section 360bbb-3(b)(1), unless the authorization is terminated  or revoked sooner.       Influenza A by PCR NEGATIVE NEGATIVE Final   Influenza B by PCR NEGATIVE NEGATIVE Final    Comment: (NOTE) The Xpert Xpress SARS-CoV-2/FLU/RSV plus assay is intended as an aid in the diagnosis of influenza from Nasopharyngeal swab specimens and should not be used as a sole basis for treatment. Nasal washings and aspirates are unacceptable for Xpert Xpress SARS-CoV-2/FLU/RSV testing.  Fact Sheet for Patients: EntrepreneurPulse.com.au  Fact Sheet for Healthcare Providers: IncredibleEmployment.be  This test is not yet approved or cleared by the [place] FDA and has been authorized for detection and/or diagnosis of SARS-CoV-2 by FDA under an Emergency Use Authorization (EUA). This EUA will remain in effect (meaning this test can be used) for the  duration of the COVID-19 declaration under Section 564(b)(1) of the Act, 21 U.S.C. section 360bbb-3(b)(1), unless the authorization is terminated or revoked.  Performed at [facility], [place] [address] [place], [place] [address]     Gaylan Gerold, [place] [facility] [place] [facility] [phone] pager   [phone] cell 07/20/2020, 3:04 PM

## 2020-07-20 NOTE — Evaluation (Signed)
Physical Therapy Evaluation Patient Details Name: Ryan Copeland MRN: [id number] DOB: 23-Aug-1950 Today's Date: 07/20/2020   History of Present Illness  70yo male admitted 07/18/20 iwth fatigue, hypotension, and a fall at home. Found to have complicated UTI. PMH back injury, CAD, CLL, COPD, DM, HLD, MI, peripheral neuropathy  Clinical Impression   Patient received sitting at EOB, pleasant and cooperative. Able to generally mobilize at a supervision level while pushing IV pole, did have one small mis-step requiring MinA to correct balance, but otherwise steady. VSS on RA. Left up in recliner with all needs met. Seems to be very close to if not at baseline, although would benefit from using RW more frequently. Will continue to follow on unit, however do not anticipate need for skilled PT f/u at DC.     Follow Up Recommendations No PT follow up    Equipment Recommendations  None recommended by PT    Recommendations for Other Services       Precautions / Restrictions Precautions Precautions: Fall;Other (comment) Precaution Comments: suprapubic cath Restrictions Weight Bearing Restrictions: No      Mobility  Bed Mobility               General bed mobility comments: sitting at EOB upon entry    Transfers Overall transfer level: Needs assistance Equipment used: None Transfers: Sit to/from Stand Sit to Stand: Supervision         General transfer comment: S for safety/line management no physical assist given  Ambulation/Gait Ambulation/Gait assistance: Supervision Gait Distance (Feet): 200 Feet Assistive device: IV Pole Gait Pattern/deviations: Step-through pattern;Trunk flexed Gait velocity: decreased   General Gait Details: able to mobilize on a supervision basis for the most part while holding IV pole; did have one small stumble in the hallway requiring MinA to maintain balance but otherwise steady with gait  Stairs            Wheelchair Mobility    Modified  [place] (Stroke Patients Only)       Balance Overall balance assessment: Needs assistance   Sitting balance-Leahy Scale: Normal Sitting balance - Comments: able to reach to floor to pick up sandal from EOB   Standing balance support: Single extremity supported;During functional activity Standing balance-Leahy Scale: Fair Standing balance comment: one time need for MinA for balance, otherwise general S                             Pertinent Vitals/Pain Pain Assessment: Faces Faces Pain Scale: Hurts a little bit Pain Location: rear Pain Descriptors / Indicators: Aching;Discomfort Pain Intervention(s): Repositioned    Home Living Family/patient expects to be discharged to:: Private residence Living Arrangements: Spouse/significant other Available Help at Discharge: Family;Available 24 hours/day;Other (Comment) (has a dog too!) Type of Home: House Home Access: Stairs to enter   [employer] of Steps: either goes in through the basement 11 steps with L ascending rail, or in the back with 7 steps and B rails Home Layout: Two level;Laundry or work area in [place]: Environmental consultant - 2 wheels;Tub bench;Shower seat Additional Comments: no falls or close calls- very independent and does what he wants, very active    Prior Function Level of Independence: Independent         Comments: Retired last year     Hand Dominance   Dominant Hand: Right    Extremity/Trunk Assessment   Upper Extremity Assessment Upper Extremity Assessment: Overall WFL for tasks assessed  Lower Extremity Assessment Lower Extremity Assessment: Overall WFL for tasks assessed    Cervical / Trunk Assessment Cervical / Trunk Assessment: Normal  Communication   Communication: No difficulties  Cognition Arousal/Alertness: Awake/alert Behavior During Therapy: WFL for tasks assessed/performed Overall Cognitive Status: Within Functional Limits for tasks assessed                                         General Comments General comments (skin integrity, edema, etc.): VSS on RA    Exercises     Assessment/Plan    PT Assessment Patient needs continued PT services  PT Problem List Decreased strength;Decreased knowledge of use of DME;Decreased balance;Decreased coordination;Impaired sensation       PT Treatment Interventions DME instruction;Balance training;Gait training;Stair training;Functional mobility training;Patient/family education;Therapeutic activities;Therapeutic exercise    PT Goals (Current goals can be found in the Care Plan section)  Acute Rehab PT Goals Patient Stated Goal: return home and get back to routine PT Goal Formulation: With patient Time For Goal Achievement: 08/03/20 Potential to Achieve Goals: Good    Frequency Min 3X/week   Barriers to discharge        Co-evaluation               AM-PAC PT "6 Clicks" Mobility  Outcome Measure Help needed turning from your back to your side while in a flat bed without using bedrails?: None Help needed moving from lying on your back to sitting on the side of a flat bed without using bedrails?: None Help needed moving to and from a bed to a chair (including a wheelchair)?: A Little Help needed standing up from a chair using your arms (e.g., wheelchair or bedside chair)?: A Little Help needed to walk in hospital room?: A Little Help needed climbing 3-5 steps with a railing? : A Little 6 Click Score: 20    End of Session   Activity Tolerance: Patient tolerated treatment well Patient left: in chair;with call bell/phone within reach Nurse Communication: Mobility status PT Visit Diagnosis: Muscle weakness (generalized) (M62.81);Unsteadiness on feet (R26.81);History of falling (Z91.81)    Time: 8546-2703 PT Time Calculation (min) (ACUTE ONLY): 24 min   Charges:   PT Evaluation $PT Eval Moderate Complexity: 1 Mod PT Treatments $Gait Training: 8-22 mins         Windell Norfolk, DPT, PN1   Supplemental Physical Therapist [place]    Pager [phone] Acute Rehab Office [phone]

## 2020-07-20 NOTE — Progress Notes (Signed)
Initial Nutrition Assessment  DOCUMENTATION CODES:   Not applicable  INTERVENTION:   -Ensure Enlive po BID, each supplement provides 350 kcal and 20 grams of protein -Magic cup BID with meals, each supplement provides 290 kcal and 9 grams of protein -MVI with minerals daily  NUTRITION DIAGNOSIS:   Inadequate oral intake related to decreased appetite as evidenced by meal completion < 50%.  GOAL:   Patient will meet greater than or equal to 90% of their needs  MONITOR:   PO intake,Supplement acceptance,Labs,Weight trends,Skin,I & O's  REASON FOR ASSESSMENT:   Consult Assessment of nutrition requirement/status  ASSESSMENT:   Ryan Copeland is a 70 y.o. male with medical history significant of PVD/CAD s/p stent; HLD; DM; COPD; chronic hypotension on midodrine; and CLL presenting with fatigue.  He has no clue why he came to the hospital.  He denies feeling weak or tired, reports feeling pretty good.  No urinary symptoms - he has a suprapubic catheter in place, due for exchange on Tuesday.   His wife reports that he slept most of the day yesterday.  He fell in the kitchen floor and it was hard to get him up.  Last night, they emptied his catheter and he went back into the kitchen and was unresponsive on the bar stool, no control over hands and arms.  His BP was low, 70/56 and he was nonverbal.  He did not fall off the bar stool.  She called 911.  She was not concerned about infection, no fevers at home.  Yesterday AM he complained of not feeling good and being tired.  He slept all day - which is mildly abnormal.  Pt admitted with complicated UTI.  Reviewed I/O's: +1 L x 24 hours and +945 ml since admission  UOP: 2.2 L x 24 hours  Spoke with pt,w ho was sitting in recliner chair at time of visit. He reports a general decline in health over the past year, which he attributes to side effects from the COVID vaccine and 3 hospitalizations this years for pneumonia, UTI, and sepsis. Pt  shares that he still tries to be active (maintaining yard and house), but feels more weak and is not able to accomplish as much as he once was able to. For example, pt reports that he has able to lift 100 pounds, but is now only able to lift 40 pounds.   Pt reports he is trying to eat, but is not fond of hospital food. RD obtained food preference and updated into [employer] meal ordering system. Noted meal completion 25-50%. PTA, pt consumes 2 meals per day (Breakfast: canned fruit, banana, and fruit juice; Dinner: meat, starch, and vegetable). Pt also reports snacking on chips and junk food throughout the day, but has not been eating lunch since he stopped working.   Pt reports his UBW is around 240# and estimates he has lost 60 pounds within the past year. He also complains of weakness as well as fat and muscle depletion. Reviewed wt hx; wt has been stable over the past 6 months.   Discussed importance of good meal and supplement intake to promote healing. Pt amenable to supplements.   Lab Results  Component Value Date   HGBA1C 4.5 (L) 02/15/2020   PTA DM medications are 500 mg metformin daily.   Labs reviewed: CBGS: 89-112 (inpatient orders for glycemic control are 0-9 units insulin aspart TID with meals).   NUTRITION - FOCUSED PHYSICAL EXAM:  Flowsheet Row Most Recent Value  Orbital  Region No depletion  Upper Arm Region Mild depletion  Thoracic and Lumbar Region No depletion  Buccal Region No depletion  Temple Region No depletion  Clavicle Bone Region Mild depletion  Clavicle and Acromion Bone Region No depletion  Scapular Bone Region Mild depletion  Dorsal Hand No depletion  Patellar Region Mild depletion  Anterior Thigh Region Mild depletion  Posterior Calf Region Mild depletion  Edema (RD Assessment) None  Hair Reviewed  Eyes Reviewed  Mouth Reviewed  Skin Reviewed  Nails Reviewed       Diet Order:   Diet Order            Diet regular Room service appropriate? Yes;  Fluid consistency: Thin  Diet effective now                 EDUCATION NEEDS:   Education needs have been addressed  Skin:  Skin Assessment: Reviewed RN Assessment  Last BM:  07/18/20  Height:   Ht Readings from Last 1 Encounters:  07/07/20 6' (1.829 m)    Weight:   Wt Readings from Last 1 Encounters:  07/07/20 72.6 kg    Ideal Body Weight:  80.9 kg  BMI:  There is no height or weight on file to calculate BMI.  Estimated Nutritional Needs:   Kcal:  2100-2300  Protein:  95-110 grams  Fluid:  > 2 L    Ryan Copeland, RD, LDN, Ryan Copeland Registered Dietitian II Certified Diabetes Care and Education Specialist Please refer to [facility] for RD and/or RD on-call/weekend/after hours pager

## 2020-07-20 NOTE — Progress Notes (Signed)
[name] to be D/C'd Home per MD order.  Discussed with the patient and all questions fully answered.  VSS, Skin clean, dry and intact without evidence of skin break down, no evidence of skin tears noted.  An After Visit Summary was printed and given to the patient. Patient received prescription.  D/c education completed with patient& family including follow up instructions, medication list, d/c activities limitations if indicated, with other d/c instructions as indicated by MD - patient able to verbalize understanding, all questions fully answered.   Patient instructed to return to ED, call 911, or call MD for any changes in condition.   Patient escorted via [place], and D/C home via private auto.  Jeanella Craze 07/20/2020 5:28 PM

## 2020-07-20 NOTE — Progress Notes (Signed)
Pt c/o of ongoing pain with his suprapubic catheter, citing 'pain more than normal after they changed it in the ED'. Urologist on call, Dr. Lovena Neighbours, paged. Suggested by MD to flush suprapubic cath 60-100 ml of saline and check positioning. Catheter flushed with 95 ml of saline. Pt. Tolerated well. Will continue to monitor.

## 2020-07-20 NOTE — Discharge Instructions (Signed)
Follow with Primary MD [name]., PA-C in 7 days   Get CBC, CMP,  checked  by Primary MD next visit.    Activity: As tolerated with Full fall precautions use walker/cane & assistance as needed   Disposition Home   Diet:  Regular diet  On your next visit with your primary care physician please Get Medicines reviewed and adjusted.   Please request your Prim.MD to go over all Hospital Tests and Procedure/Radiological results at the follow up, please get all Hospital records sent to your Prim MD by signing hospital release before you go home.   If you experience worsening of your admission symptoms, develop shortness of breath, life threatening emergency, suicidal or homicidal thoughts you must seek medical attention immediately by calling 911 or calling your MD immediately  if symptoms less severe.  You Must read complete instructions/literature along with all the possible adverse reactions/side effects for all the Medicines you take and that have been prescribed to you. Take any new Medicines after you have completely understood and accpet all the possible adverse reactions/side effects.   Do not drive, operating heavy machinery, perform activities at heights, swimming or participation in water activities or provide baby sitting services if your were admitted for syncope or siezures until you have seen by Primary MD or a Neurologist and advised to do so again.  Do not drive when taking Pain medications.    Do not take more than prescribed Pain, Sleep and Anxiety Medications  Special Instructions: If you have smoked or chewed Tobacco  in the last 2 yrs please stop smoking, stop any regular Alcohol  and or any Recreational drug use.  Wear Seat belts while driving.   Please note  You were cared for by a hospitalist during your hospital stay. If you have any questions about your discharge medications or the care you received while you were in the hospital after you are  discharged, you can call the unit and asked to speak with the hospitalist on call if the hospitalist that took care of you is not available. Once you are discharged, your primary care physician will handle any further medical issues. Please note that NO REFILLS for any discharge medications will be authorized once you are discharged, as it is imperative that you return to your primary care physician (or establish a relationship with a primary care physician if you do not have one) for your aftercare needs so that they can reassess your need for medications and monitor your lab values.

## 2020-07-20 NOTE — Discharge Summary (Signed)
Physician Discharge Summary  Ryan Copeland GBT:[id number] DOB: 28-Oct-1950 DOA: 07/18/2020  PCP: [name]., PA-C  Admit date: 07/18/2020 Discharge date: 07/20/2020  Admitted From: Home Disposition:  Home  Recommendations for Outpatient Follow-up:  1. Follow up with PCP in 1-2 weeks 2. 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 Louis Meckel in ED, patient with known history of MDR Pseudomonas aeruginosa, he was started empirically on IV ceftazidime for presumed UTI, patient is afebrile, no leukocytosis, procalcitonin within normal limit, afebrile and nontoxic-appearing, urine culture growing pansensitive Pseudomonas, so infectious disease were consulted regarding antibiotic recommendation, no indication for further antibiotics, and that has been stopped, and patient is okay to discharge from ID's standpoint off antibiotics.  Chronic hypotension -Continue with Midodrin -He was treated with IV fluids during hospital stay  CLL -Continue Calquence (MAB therapy) -Continue Acyclovir -Continue IVIG -Followed by Dr. Alvy Bimler  Dysphagia -Started on Fluconazole by Dr. Benson Norway, as well added Magic mouthwash during  hospital stay, patient report dysphagia has resolved, he was instructed to continue his fluconazole treatment course.   HLD -Continue Crestor  PVD -Continue ASA  DM -Prior A1c was 4.5 -Resume home medications on discharge  Pancytopenia -Appears very stable at this time  COPD -Continue Singulair, Tessalon perles  Chronic diastolic CHF -71/0/62 echo with grade 1 diastolic dysfunction -Appears to be compensated at this time  Anxiety/Chronic pain -Continue home regimen on discharge    Discharge Instructions  Discharge Instructions    Increase activity slowly   Complete by: As directed      Allergies as of 07/20/2020      Reactions   Immune Globulin Hives, Rash   Pt reports hives/rash on body with IV Privigen. He was changed to IV Gamunex-C and had many infusions without adverse side effects.   Codeine Hives   Pt states he can take a few, more reaction with extended doses.      Medication List    TAKE these medications   acetaminophen 325 MG tablet Commonly known as: TYLENOL Take 2 tablets (650 mg total) by mouth every 6 (six) hours as needed for moderate pain or fever.   acyclovir 400 MG tablet Commonly known as: ZOVIRAX TAKE 1 TABLET BY MOUTH TWICE A DAY   allopurinol 300 MG tablet Commonly known as: ZYLOPRIM TAKE 1 TABLET BY MOUTH EVERY DAY   ALPRAZolam 0.5 MG tablet Commonly known as: XANAX Take 0.5 mg by mouth 2 (two) times daily as needed for anxiety.   aspirin EC 81 MG tablet Take 81 mg by mouth daily. Swallow whole.   b complex vitamins tablet Take 1 tablet by mouth daily.   benzonatate 100 MG capsule Commonly known as: TESSALON TAKE TWO CAPSULES BY MOUTH THREE TIMES A DAY AS NEEDED What changed: See the new instructions.  Calquence 100 MG capsule Generic drug: acalabrutinib TAKE 1 CAPSULE BY MOUTH TWICE A DAY What changed: how much to take   Cholecalciferol 25 MCG (1000 UT) tablet Take 1,000 Units by mouth daily.    diphenoxylate-atropine 2.5-0.025 MG tablet Commonly known as: LOMOTIL Take 1 tablet by mouth 4 (four) times daily as needed for diarrhea or loose stools.   famotidine 20 MG tablet Commonly known as: PEPCID TAKE 1 TABLET BY MOUTH TWICE A DAY   fluconazole 200 MG tablet Commonly known as: DIFLUCAN Take 200 mg by mouth See admin instructions. Qd x 10 days   HYDROcodone-acetaminophen 10-325 MG tablet Commonly known as: NORCO Take 1 tablet by mouth every 6 (six) hours as needed for moderate pain.   lidocaine-prilocaine cream Commonly known as: EMLA Apply 1 application topically as needed (prior chemo/IBIG).   meclizine 12.5 MG tablet Commonly known as: ANTIVERT Take 12.5 mg by mouth 3 (three) times daily as needed for dizziness.   metFORMIN 500 MG 24 hr tablet Commonly known as: GLUCOPHAGE-XR Take 500 mg by mouth every evening.   midodrine 5 MG tablet Commonly known as: PROAMATINE Take 1.5 tablets (7.5 mg total) by mouth 3 (three) times daily with meals.   montelukast 10 MG tablet Commonly known as: SINGULAIR TAKE 1 TABLET BY MOUTH EVERY DAY   nitroGLYCERIN 0.4 MG SL tablet Commonly known as: NITROSTAT PLACE 1 TABLET UNDER THE TONGUE EVERY 5 MINUTES X 3 DOSES AS NEEDED FOR CHEST PAIN *MAX 3 DOSES* What changed:   how much to take  how to take this  when to take this  reasons to take this  additional instructions   pregabalin 200 MG capsule Commonly known as: LYRICA TAKE 1 CAPSULE (200 MG TOTAL) BY MOUTH 3 (THREE) TIMES DAILY.   rosuvastatin 5 MG tablet Commonly known as: CRESTOR Take 1 tablet (5 mg total) by mouth daily.   sildenafil 100 MG tablet Commonly known as: Viagra Take 0.5 tablets (50 mg total) by mouth as needed for erectile dysfunction (30 mintues prior to sexual intercourse).   tamsulosin 0.4 MG Caps capsule Commonly known as: FLOMAX Take 1 capsule (0.4 mg total) by mouth daily.   zolpidem 10 MG tablet Commonly known as: AMBIEN Take 10 mg  by mouth at bedtime as needed for sleep.       Follow-up Information    [name]., PA-C Follow up in 1 week(s).   Specialty: Family Medicine Contact information: [address] [place] [address] [phone]              Allergies  Allergen Reactions  . Immune Globulin Hives and Rash    Pt reports hives/rash on body with IV Privigen. He was changed to IV Gamunex-C and had many infusions without adverse side effects.   . Codeine Hives    Pt states he can take a few, more reaction with extended doses.    Consultations: Urology  ID   Procedures/Studies: CT HEAD WO CONTRAST  Result Date: 07/18/2020 CLINICAL DATA:  Encephalopathy EXAM: CT HEAD WITHOUT CONTRAST TECHNIQUE: Contiguous axial images were obtained from the base of the skull through the vertex without intravenous contrast. COMPARISON:  None. FINDINGS: Brain: There is no mass, hemorrhage or extra-axial collection. The size and configuration of the ventricles and extra-axial CSF spaces are normal. There is hypoattenuation of the white matter, most commonly indicating chronic small vessel disease. Vascular: Atherosclerotic calcification of the internal carotid arteries at the skull base. No abnormal hyperdensity of the  major intracranial arteries or dural venous sinuses. Skull: The visualized skull base, calvarium and extracranial soft tissues are normal. Sinuses/Orbits: Ethmoid and maxillary chronic sinusitis. The orbits are normal. IMPRESSION: 1. No acute intracranial abnormality. 2. Chronic sinusitis. Electronically Signed   By: Ulyses Jarred M.D.   On: 07/18/2020 02:46   DG Chest Port 1 View  Result Date: 07/18/2020 CLINICAL DATA:  Altered mental status EXAM: PORTABLE CHEST 1 VIEW COMPARISON:  02/15/2020 FINDINGS: The heart size and mediastinal contours are within normal limits. Both lungs are clear. The visualized skeletal structures are unremarkable. There is a right chest wall power-injectable  Port-A-Cath with tip at the cavoatrial junction via a right internal jugular vein approach. IMPRESSION: No active disease. Electronically Signed   By: Ulyses Jarred M.D.   On: 07/18/2020 01:57      Subjective:  Report he is feeling better today, feeling his new suprapubic cath is kinked a lot which is leading to urinary retention, reports generalized weakness has improved as well.   Discharge Exam: Vitals:   07/20/20 0419 07/20/20 1152  BP: (!) 102/59 (!) 110/52  Pulse:  60  Resp: 18 15  Temp: 98.1 F (36.7 C) 97.8 F (36.6 C)  SpO2: 95% 96%   Vitals:   07/19/20 1344 07/19/20 2009 07/20/20 0419 07/20/20 1152  BP: (!) 105/55 (!) 111/55 (!) 102/59 (!) 110/52  Pulse: (!) 51   60  Resp: 20 19 18 15   Temp: 98.3 F (36.8 C) 98.3 F (36.8 C) 98.1 F (36.7 C) 97.8 F (36.6 C)  TempSrc: Oral Oral Oral Oral  SpO2: 98% 95% 95% 96%    General: Pt is alert, awake, not in acute distress Cardiovascular: RRR, S1/S2 +, no rubs, no gallops Respiratory: CTA bilaterally, no wheezing, no rhonchi Abdominal: Soft, NT, ND, bowel sounds + Extremities: no edema, no cyanosis    The results of significant diagnostics from this hospitalization (including imaging, microbiology, ancillary and laboratory) are listed below for reference.     Microbiology: Recent Results (from the past 240 hour(s))  Urine culture     Status: Abnormal   Collection Time: 07/18/20  1:01 AM   Specimen: Urine, Random  Result Value Ref Range Status   Specimen Description URINE, RANDOM  Final   Special Requests   Final    NONE Performed at [facility], [address], [place], [place] [address]    Culture >=100,000 COLONIES/mL PSEUDOMONAS AERUGINOSA (A)  Final   Report Status 07/20/2020 FINAL  Final   Organism ID, Bacteria PSEUDOMONAS AERUGINOSA (A)  Final      Susceptibility   Pseudomonas aeruginosa - MIC*    CEFTAZIDIME 8 SENSITIVE Sensitive     CIPROFLOXACIN <=0.25 SENSITIVE Sensitive     GENTAMICIN  <=1 SENSITIVE Sensitive     IMIPENEM 1 SENSITIVE Sensitive     PIP/TAZO <=4 SENSITIVE Sensitive     CEFEPIME 2 SENSITIVE Sensitive     * >=100,000 COLONIES/mL PSEUDOMONAS AERUGINOSA  Blood Cultures (routine x 2)     Status: None (Preliminary result)   Collection Time: 07/18/20  1:07 AM   Specimen: BLOOD  Result Value Ref Range Status   Specimen Description BLOOD RIGHT ANTECUBITAL  Final   Special Requests   Final    BOTTLES DRAWN AEROBIC AND ANAEROBIC Blood Culture adequate volume   Culture   Final    NO GROWTH 2 DAYS Performed at [facility], [address], [place], [place] [address]    Report Status  PENDING  Incomplete  Blood Cultures (routine x 2)     Status: None (Preliminary result)   Collection Time: 07/18/20  1:40 AM   Specimen: BLOOD  Result Value Ref Range Status   Specimen Description BLOOD RIGHT UPPER ARM  Final   Special Requests   Final    BOTTLES DRAWN AEROBIC AND ANAEROBIC Blood Culture results may not be optimal due to an inadequate volume of blood received in culture bottles   Culture   Final    NO GROWTH 2 DAYS Performed at [facility], [place] [address] [place], [place] [address]    Report Status PENDING  Incomplete  Resp Panel by RT-PCR (Flu A&B, Covid) Nasopharyngeal Swab     Status: None   Collection Time: 07/18/20  5:28 AM   Specimen: Nasopharyngeal Swab; Nasopharyngeal(NP) swabs in vial transport medium  Result Value Ref Range Status   SARS Coronavirus 2 by RT PCR NEGATIVE NEGATIVE Final    Comment: (NOTE) SARS-CoV-2 target nucleic acids are NOT DETECTED.  The SARS-CoV-2 RNA is generally detectable in upper respiratory specimens during the acute phase of infection. The lowest concentration of SARS-CoV-2 viral copies this assay can detect is 138 copies/mL. A negative result does not preclude SARS-Cov-2 infection and should not be used as the sole basis for treatment or other patient management decisions. A negative result may occur with   improper specimen collection/handling, submission of specimen other than nasopharyngeal swab, presence of viral mutation(s) within the areas targeted by this assay, and inadequate number of viral copies(<138 copies/mL). A negative result must be combined with clinical observations, patient history, and epidemiological information. The expected result is Negative.  Fact Sheet for Patients:  EntrepreneurPulse.com.au  Fact Sheet for Healthcare Providers:  IncredibleEmployment.be  This test is no t yet approved or cleared by the [place] FDA and  has been authorized for detection and/or diagnosis of SARS-CoV-2 by FDA under an Emergency Use Authorization (EUA). This EUA will remain  in effect (meaning this test can be used) for the duration of the COVID-19 declaration under Section 564(b)(1) of the Act, 21 U.S.C.section 360bbb-3(b)(1), unless the authorization is terminated  or revoked sooner.       Influenza A by PCR NEGATIVE NEGATIVE Final   Influenza B by PCR NEGATIVE NEGATIVE Final    Comment: (NOTE) The Xpert Xpress SARS-CoV-2/FLU/RSV plus assay is intended as an aid in the diagnosis of influenza from Nasopharyngeal swab specimens and should not be used as a sole basis for treatment. Nasal washings and aspirates are unacceptable for Xpert Xpress SARS-CoV-2/FLU/RSV testing.  Fact Sheet for Patients: EntrepreneurPulse.com.au  Fact Sheet for Healthcare Providers: IncredibleEmployment.be  This test is not yet approved or cleared by the [place] FDA and has been authorized for detection and/or diagnosis of SARS-CoV-2 by FDA under an Emergency Use Authorization (EUA). This EUA will remain in effect (meaning this test can be used) for the duration of the COVID-19 declaration under Section 564(b)(1) of the Act, 21 U.S.C. section 360bbb-3(b)(1), unless the authorization is terminated  or revoked.  Performed at [facility], [place] [address], [place], [place] [address]      Labs: BNP (last 3 results) Recent Labs    12/15/19 0647 02/15/20 0503  BNP 203.2* 701.7*   Basic Metabolic Panel: Recent Labs  Lab 07/18/20 0148 07/18/20 0315 07/19/20 0046 07/20/20 0131  NA 139 140 141 142  K 4.8 4.2 4.0 4.1  CL  --  107 112* 112*  CO2  --  23 23 26   GLUCOSE  --  75 82 89  BUN  --  10 10 10   CREATININE  --  0.77 0.77 0.79  CALCIUM  --  8.8* 8.3* 8.4*  MG  --   --   --  1.7   Liver Function Tests: Recent Labs  Lab 07/18/20 0315  AST 14*  ALT 14  ALKPHOS 101  BILITOT 0.6  PROT 5.4*  ALBUMIN 3.2*   No results for input(s): LIPASE, AMYLASE in the last 168 hours. Recent Labs  Lab 07/18/20 0315  AMMONIA 27   CBC: Recent Labs  Lab 07/18/20 0148 07/18/20 0315 07/19/20 0046 07/20/20 0131  WBC  --  6.2 4.6 3.2*  NEUTROABS  --  3.3  --   --   HGB 14.3 11.7* 10.7* 10.8*  HCT 42.0 37.0* 33.7* 33.5*  MCV  --  97.1 96.6 96.3  PLT  --  76* 68* 61*   Cardiac Enzymes: No results for input(s): CKTOTAL, CKMB, CKMBINDEX, TROPONINI in the last 168 hours. BNP: Invalid input(s): POCBNP CBG: Recent Labs  Lab 07/19/20 1225 07/19/20 1646 07/19/20 2009 07/20/20 0757 07/20/20 1202  GLUCAP 78 112* 107* 89 86   D-Dimer No results for input(s): DDIMER in the last 72 hours. Hgb A1c No results for input(s): HGBA1C in the last 72 hours. Lipid Profile No results for input(s): CHOL, HDL, LDLCALC, TRIG, CHOLHDL, LDLDIRECT in the last 72 hours. Thyroid function studies No results for input(s): TSH, T4TOTAL, T3FREE, THYROIDAB in the last 72 hours.  Invalid input(s): FREET3 Anemia work up No results for input(s): VITAMINB12, FOLATE, FERRITIN, TIBC, IRON, RETICCTPCT in the last 72 hours. Urinalysis    Component Value Date/Time   COLORURINE YELLOW 07/18/2020 0410   APPEARANCEUR HAZY (A) 07/18/2020 0410   LABSPEC 1.009 07/18/2020 0410   PHURINE 6.0  07/18/2020 0410   GLUCOSEU NEGATIVE 07/18/2020 0410   HGBUR SMALL (A) 07/18/2020 0410   BILIRUBINUR NEGATIVE 07/18/2020 0410   KETONESUR NEGATIVE 07/18/2020 0410   PROTEINUR NEGATIVE 07/18/2020 0410   UROBILINOGEN 0.2 02/03/2014 1356   NITRITE POSITIVE (A) 07/18/2020 0410   LEUKOCYTESUR LARGE (A) 07/18/2020 0410   Sepsis Labs Invalid input(s): PROCALCITONIN,  WBC,  LACTICIDVEN Microbiology Recent Results (from the past 240 hour(s))  Urine culture     Status: Abnormal   Collection Time: 07/18/20  1:01 AM   Specimen: Urine, Random  Result Value Ref Range Status   Specimen Description URINE, RANDOM  Final   Special Requests   Final    NONE Performed at [facility], [place] [address] [place], [place] [address]    Culture >=100,000 COLONIES/mL PSEUDOMONAS AERUGINOSA (A)  Final   Report Status 07/20/2020 FINAL  Final   Organism ID, Bacteria PSEUDOMONAS AERUGINOSA (A)  Final      Susceptibility   Pseudomonas aeruginosa - MIC*    CEFTAZIDIME 8 SENSITIVE Sensitive     CIPROFLOXACIN <=0.25 SENSITIVE Sensitive     GENTAMICIN <=1 SENSITIVE Sensitive     IMIPENEM 1 SENSITIVE Sensitive     PIP/TAZO <=4 SENSITIVE Sensitive     CEFEPIME 2 SENSITIVE Sensitive     * >=100,000 COLONIES/mL PSEUDOMONAS AERUGINOSA  Blood Cultures (routine x 2)     Status: None (Preliminary result)   Collection Time: 07/18/20  1:07 AM   Specimen: BLOOD  Result Value Ref Range Status   Specimen Description BLOOD RIGHT ANTECUBITAL  Final   Special Requests   Final    BOTTLES DRAWN AEROBIC AND ANAEROBIC  Blood Culture adequate volume   Culture   Final    NO GROWTH 2 DAYS Performed at [facility], [place] [address], [place], [place] [address]    Report Status PENDING  Incomplete  Blood Cultures (routine x 2)     Status: None (Preliminary result)   Collection Time: 07/18/20  1:40 AM   Specimen: BLOOD  Result Value Ref Range Status   Specimen Description BLOOD RIGHT UPPER ARM  Final   Special Requests    Final    BOTTLES DRAWN AEROBIC AND ANAEROBIC Blood Culture results may not be optimal due to an inadequate volume of blood received in culture bottles   Culture   Final    NO GROWTH 2 DAYS Performed at [facility], [place] [address] [place], [place] [address]    Report Status PENDING  Incomplete  Resp Panel by RT-PCR (Flu A&B, Covid) Nasopharyngeal Swab     Status: None   Collection Time: 07/18/20  5:28 AM   Specimen: Nasopharyngeal Swab; Nasopharyngeal(NP) swabs in vial transport medium  Result Value Ref Range Status   SARS Coronavirus 2 by RT PCR NEGATIVE NEGATIVE Final    Comment: (NOTE) SARS-CoV-2 target nucleic acids are NOT DETECTED.  The SARS-CoV-2 RNA is generally detectable in upper respiratory specimens during the acute phase of infection. The lowest concentration of SARS-CoV-2 viral copies this assay can detect is 138 copies/mL. A negative result does not preclude SARS-Cov-2 infection and should not be used as the sole basis for treatment or other patient management decisions. A negative result may occur with  improper specimen collection/handling, submission of specimen other than nasopharyngeal swab, presence of viral mutation(s) within the areas targeted by this assay, and inadequate number of viral copies(<138 copies/mL). A negative result must be combined with clinical observations, patient history, and epidemiological information. The expected result is Negative.  Fact Sheet for Patients:  EntrepreneurPulse.com.au  Fact Sheet for Healthcare Providers:  IncredibleEmployment.be  This test is no t yet approved or cleared by the [place] FDA and  has been authorized for detection and/or diagnosis of SARS-CoV-2 by FDA under an Emergency Use Authorization (EUA). This EUA will remain  in effect (meaning this test can be used) for the duration of the COVID-19 declaration under Section 564(b)(1) of the Act, 21 U.S.C.section  360bbb-3(b)(1), unless the authorization is terminated  or revoked sooner.       Influenza A by PCR NEGATIVE NEGATIVE Final   Influenza B by PCR NEGATIVE NEGATIVE Final    Comment: (NOTE) The Xpert Xpress SARS-CoV-2/FLU/RSV plus assay is intended as an aid in the diagnosis of influenza from Nasopharyngeal swab specimens and should not be used as a sole basis for treatment. Nasal washings and aspirates are unacceptable for Xpert Xpress SARS-CoV-2/FLU/RSV testing.  Fact Sheet for Patients: EntrepreneurPulse.com.au  Fact Sheet for Healthcare Providers: IncredibleEmployment.be  This test is not yet approved or cleared by the [place] FDA and has been authorized for detection and/or diagnosis of SARS-CoV-2 by FDA under an Emergency Use Authorization (EUA). This EUA will remain in effect (meaning this test can be used) for the duration of the COVID-19 declaration under Section 564(b)(1) of the Act, 21 U.S.C. section 360bbb-3(b)(1), unless the authorization is terminated or revoked.  Performed at [facility], [place] [address], [place], [place] [address]      Time coordinating discharge: Over 30 minutes  SIGNED:   Phillips Climes, MD  [facility] 07/20/2020, 4:13 PM Pager  If 7PM-7AM, please contact night-coverage [email or web address] Password [email or web address]

## 2020-07-23 ENCOUNTER — Telehealth: Payer: Self-pay

## 2020-07-23 ENCOUNTER — Other Ambulatory Visit: Payer: Self-pay | Admitting: Hematology and Oncology

## 2020-07-23 DIAGNOSIS — E119 Type 2 diabetes mellitus without complications: Secondary | ICD-10-CM

## 2020-07-23 LAB — CULTURE, BLOOD (ROUTINE X 2)
Culture: NO GROWTH
Culture: NO GROWTH
Special Requests: ADEQUATE

## 2020-07-23 NOTE — Telephone Encounter (Signed)
He called asking if Dr. Alvy Bimler could order HGB a1c tomorrow. Sent Dr. Alvy Bimler a message.

## 2020-07-24 ENCOUNTER — Other Ambulatory Visit: Payer: Self-pay

## 2020-07-24 ENCOUNTER — Inpatient Hospital Stay: Payer: Medicare Other

## 2020-07-24 ENCOUNTER — Inpatient Hospital Stay: Payer: Medicare Other | Attending: Hematology and Oncology | Admitting: Hematology and Oncology

## 2020-07-24 ENCOUNTER — Encounter: Payer: Self-pay | Admitting: Hematology and Oncology

## 2020-07-24 VITALS — BP 116/72 | HR 50 | Temp 98.1°F | Resp 18

## 2020-07-24 DIAGNOSIS — C8308 Small cell B-cell lymphoma, lymph nodes of multiple sites: Secondary | ICD-10-CM

## 2020-07-24 DIAGNOSIS — D801 Nonfamilial hypogammaglobulinemia: Secondary | ICD-10-CM

## 2020-07-24 DIAGNOSIS — D61818 Other pancytopenia: Secondary | ICD-10-CM

## 2020-07-24 DIAGNOSIS — Z95828 Presence of other vascular implants and grafts: Secondary | ICD-10-CM

## 2020-07-24 LAB — COMPREHENSIVE METABOLIC PANEL
ALT: 16 U/L (ref 0–44)
AST: 19 U/L (ref 15–41)
Albumin: 3.3 g/dL — ABNORMAL LOW (ref 3.5–5.0)
Alkaline Phosphatase: 121 U/L (ref 38–126)
Anion gap: 7 (ref 5–15)
BUN: 11 mg/dL (ref 8–23)
CO2: 27 mmol/L (ref 22–32)
Calcium: 8.6 mg/dL — ABNORMAL LOW (ref 8.9–10.3)
Chloride: 107 mmol/L (ref 98–111)
Creatinine, Ser: 0.72 mg/dL (ref 0.61–1.24)
GFR, Estimated: >60 mL/min (ref >60)
Glucose, Bld: 89 mg/dL (ref 70–99)
Potassium: 4.5 mmol/L (ref 3.5–5.1)
Sodium: 141 mmol/L (ref 135–145)
Total Bilirubin: 0.3 mg/dL (ref 0.3–1.2)
Total Protein: 5.9 g/dL — ABNORMAL LOW (ref 6.5–8.1)

## 2020-07-24 LAB — CBC WITH DIFFERENTIAL/PLATELET
Abs Immature Granulocytes: 0 10*3/uL (ref 0.00–0.07)
Basophils Absolute: 0 10*3/uL (ref 0.0–0.1)
Basophils Relative: 0 %
Eosinophils Absolute: 0.1 10*3/uL (ref 0.0–0.5)
Eosinophils Relative: 1 %
HCT: 36 % — ABNORMAL LOW (ref 39.0–52.0)
Hemoglobin: 11.6 g/dL — ABNORMAL LOW (ref 13.0–17.0)
Lymphocytes Relative: 63 %
Lymphs Abs: 4.5 10*3/uL — ABNORMAL HIGH (ref 0.7–4.0)
MCH: 30.6 pg (ref 26.0–34.0)
MCHC: 32.2 g/dL (ref 30.0–36.0)
MCV: 95 fL (ref 80.0–100.0)
Monocytes Absolute: 0.6 10*3/uL (ref 0.1–1.0)
Monocytes Relative: 8 %
Neutro Abs: 2 10*3/uL (ref 1.7–7.7)
Neutrophils Relative %: 28 %
Platelets: 74 10*3/uL — ABNORMAL LOW (ref 150–400)
RBC: 3.79 MIL/uL — ABNORMAL LOW (ref 4.22–5.81)
RDW: 18.6 % — ABNORMAL HIGH (ref 11.5–15.5)
WBC: 7.1 10*3/uL (ref 4.0–10.5)
nRBC: 0 % (ref 0.0–0.2)

## 2020-07-24 MED ORDER — HEPARIN SOD (PORK) LOCK FLUSH 100 UNIT/ML IV SOLN
500.0000 [IU] | Freq: Once | INTRAVENOUS | Status: AC
Start: 2020-07-24 — End: 2020-07-24
  Administered 2020-07-24: 500 [IU]
  Filled 2020-07-24: qty 5

## 2020-07-24 MED ORDER — SODIUM CHLORIDE 0.9% FLUSH
10.0000 mL | Freq: Once | INTRAVENOUS | Status: AC
  Administered 2020-07-24: 10 mL
  Filled 2020-07-24: qty 10

## 2020-07-24 MED ORDER — DIPHENHYDRAMINE HCL 25 MG PO TABS
25.0000 mg | ORAL_TABLET | Freq: Once | ORAL | Status: AC
  Administered 2020-07-24: 25 mg via ORAL
  Filled 2020-07-24: qty 1

## 2020-07-24 MED ORDER — ACETAMINOPHEN 325 MG PO TABS
650.0000 mg | ORAL_TABLET | Freq: Once | ORAL | Status: AC
  Administered 2020-07-24: 650 mg via ORAL

## 2020-07-24 MED ORDER — METHYLPREDNISOLONE SODIUM SUCC 40 MG IJ SOLR
INTRAMUSCULAR | Status: AC
  Filled 2020-07-24: qty 1

## 2020-07-24 MED ORDER — LOPERAMIDE HCL 2 MG PO CAPS: 4.0000 mg | ORAL_CAPSULE | Freq: Four times a day (QID) | ORAL | 9 refills | Status: AC

## 2020-07-24 MED ORDER — DIPHENHYDRAMINE HCL 25 MG PO CAPS
ORAL_CAPSULE | ORAL | Status: AC
  Filled 2020-07-24: qty 1

## 2020-07-24 MED ORDER — METHYLPREDNISOLONE SODIUM SUCC 40 MG IJ SOLR
40.0000 mg | Freq: Once | INTRAMUSCULAR | Status: AC
  Administered 2020-07-24: 40 mg via INTRAVENOUS

## 2020-07-24 MED ORDER — IMMUNE GLOBULIN (HUMAN) 10 GM/100ML IJ SOLN
40.0000 g | Freq: Once | INTRAMUSCULAR | Status: AC
  Administered 2020-07-24: 40 g via INTRAVENOUS
  Filled 2020-07-24: qty 400

## 2020-07-24 MED ORDER — ACETAMINOPHEN 325 MG PO TABS
ORAL_TABLET | ORAL | Status: AC
  Filled 2020-07-24: qty 2

## 2020-07-24 MED ORDER — DEXTROSE 5 % IV SOLN
INTRAVENOUS | Status: DC
  Filled 2020-07-24: qty 250

## 2020-07-24 NOTE — Progress Notes (Signed)
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 [facility]) He tolerated [place] well except for bruising and mild anemia We will continue treatment as scheduled I recommend IVIG treatment along with aggressive supportive care So far, he has no hospitalization since we started IVIG  Hypogammaglobulinemia, acquired We discussed monthly IVIG for the next few months and he is in agreement to proceed  Pancytopenia, acquired [facility]) He has multifactorial pancytopenia He is known to have 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   All questions were answered. The patient knows to call the clinic with any problems, questions or concerns. The total time spent in the appointment was 20 minutes encounter with patients including review of chart and various tests results, discussions about plan of care and coordination of care plan   [name], MD 07/24/2020 9:58 AM  INTERVAL HISTORY: Please see below for problem oriented charting. He returns for monthly IVIG He had recent urinary tract infection and is currently on Cipro He has chronic indwelling Foley catheter due to urinary retention His chronic diarrhea is stable No recent fever or chills The patient denies any recent signs or symptoms of bleeding such as spontaneous epistaxis, hematuria or hematochezia.   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  No evidence of aortic dissection or  other acute process in the chest. 2.  Centrilobular emphysema with a 5 mm right lung nodule. Given the concurrent centrilobular emphysema, follow-up chest CT at 6 -12 months is recommended.  3.  Coronary artery atherosclerosis which is age advanced. 4.  Prominent thoracic lymph nodes.  These can be reevaluated at follow-up.    05/06/2010 Imaging   1.  Multiple small periaortic lymph nodes consistent with the patient's history of the chronic lymphocytic leukemia. 2.  No evidence of solid organ involvement    11/03/2011 Imaging   1.  Interval progression of abdominal and pelvic adenopathy. 2.  Progression of splenomegaly.  The spleen now measures 23 cm in length    11/18/2011 Bone Marrow Biopsy   Bone Marrow, Aspirate,Biopsy, and Clot, right iliac bone - HYPERCELLULAR BONE MARROW WITH EXTENSIVE INVOLVEMENT BY CHRONIC LYMPHOCYTIC LEUKEMIA. PERIPHERAL BLOOD: - CHRONIC LYMPHOCYTIC LEUKEMIA    03/08/2012 Imaging   1.  Progressive increase in retroperitoneal, iliac, and inguinal lymphadenopathy. 2.  Interval increase in massive splenomegaly.      06/01/2012 Procedure   Placement of single lumen port a cath via right internal jugular vein.  The catheter tip lies at the cavoatrial junction.  A power injectable port a cath was placed and is ready for immediate use    06/20/2012 - 11/23/2012 Chemotherapy   He received FCR x 6 cycles    12/19/2012 Imaging   Left common iliac stent. Abdominal vasculature remains patent. Improving supraclavicular and axillary lymphadenopathy. Residual right subpectoral nodes measure up to 10 mm short axis. Improving retroperitoneal lymphadenopathy, measuring up to 16 mm short axis. Improving splenomegaly, measuring 18.7 cm.      01/20/2016 Imaging  1. Stable exam.  No new or progressive findings. 2. No CT findings to explain odynophagia    09/02/2016 Pathology Results   The findings are consistent with involvement by previously known chronic lymphocytic  leukemia    09/02/2016 Pathology Results   FISH for CLL came back positive for deletion 13q    09/15/2016 Imaging   1. Borderline enlarged abdominal and pelvic lymph nodes. Compared with 11/03/2011 these are decreased in size as detailed above. 2. Persistent splenomegaly. 3. Aortic Atherosclerosis (ICD10-I70.0). LAD coronary artery calcification noted.    10/06/2016 - 02/24/2017 Chemotherapy   He received Bendamustine and Rituxan    11/03/2016 Adverse Reaction   Dose of Bendamustine is reduced due to severe pancytopenia    12/28/2016 Imaging   1. Borderline enlarged abdominal peritoneal ligament and abdominal retroperitoneal lymph nodes, stable. 2. Splenomegaly. 3.  Aortic atherosclerosis (ICD10-170.0).    03/22/2017 PET scan   1. No hypermetabolic adenopathy identified within the neck, chest, abdomen or pelvis. 2. Prominent left retroperitoneal node measures 1.6 cm without significant FDG uptake. 3. Splenomegaly. 4. Aortic Atherosclerosis (ICD10-I70.0) and Emphysema (ICD10-J43.9). LAD and left circumflex atherosclerotic calcifications noted.    02/02/2018 Procedure   IMPRESSION: Widely patent right IJ power port catheter.    04/06/2018 Imaging   1. Interval enlargement of axillary, mediastinal, and retroperitoneal lymph nodes, as well as increased splenomegaly, findings concerning for progression of CLL in comparison to prior PET-CT dated 03/22/2017.   2.  Other chronic and incidental findings as detailed above.      04/16/2018 -  Chemotherapy   The patient had acalabrutinib for chemo    04/17/2018 - 04/21/2018 Hospital Admission   He was hospitalized for influenza    11/29/2018 Imaging   1. Interval response to therapy. No thoracic added not scratch set no thoracic or pelvic adenopathy identified. Significant improvement in abdominal adenopathy. Largest remaining lymph node measures 1.3cm in the left retroperitoneal region. Previously 2.5 cm. 2. Persistent splenomegaly,  improved from previous exam. 3. No new or progressive disease identified within the chest, abdomen or pelvis. 4. Stable appearance of 5 mm right middle lobe lung nodule. 5. Aortic Atherosclerosis (ICD10-I70.0) and Emphysema (ICD10-J43.9). Coronary artery calcifications.       REVIEW OF SYSTEMS:   Constitutional: Denies fevers, chills or abnormal weight loss Eyes: Denies blurriness of vision Ears, nose, mouth, throat, and face: Denies mucositis or sore throat Respiratory: Denies cough, dyspnea or wheezes Cardiovascular: Denies palpitation, chest discomfort or lower extremity swelling Skin: Denies abnormal skin rashes Lymphatics: Denies new lymphadenopathy  Neurological:Denies numbness, tingling or new weaknesses Behavioral/Psych: Mood is stable, no new changes  All other systems were reviewed with the patient and are negative.  I have reviewed the past medical history, past surgical history, social history and family history with the patient and they are unchanged from previous note.  ALLERGIES:  is allergic to immune globulin and codeine.  MEDICATIONS:  Current Outpatient Medications  Medication Sig Dispense Refill   ciprofloxacin (CIPRO) 500 MG tablet Take 500 mg by mouth 2 (two) times daily.     loperamide (IMODIUM) 2 MG capsule Take 2 capsules (4 mg total) by mouth 4 (four) times daily. 120 capsule 9   acalabrutinib (CALQUENCE) 100 MG capsule TAKE 1 CAPSULE BY MOUTH TWICE A DAY (Patient taking differently: Take 100 mg by mouth 2 (two) times daily.) 60 capsule 11   acetaminophen (TYLENOL) 325 MG tablet Take 2 tablets (650 mg total) by mouth every 6 (six) hours as needed for  moderate pain or fever. 30 tablet 0   acyclovir (ZOVIRAX) 400 MG tablet TAKE 1 TABLET BY MOUTH TWICE A DAY (Patient taking differently: Take 400 mg by mouth 2 (two) times daily.) 180 tablet 1   allopurinol (ZYLOPRIM) 300 MG tablet TAKE 1 TABLET BY MOUTH EVERY DAY (Patient taking differently: Take 300 mg by mouth  daily.) 90 tablet 1   ALPRAZolam (XANAX) 0.5 MG tablet Take 0.5 mg by mouth 2 (two) times daily as needed for anxiety.     aspirin EC 81 MG tablet Take 81 mg by mouth daily. Swallow whole.     b complex vitamins tablet Take 1 tablet by mouth daily.      benzonatate (TESSALON) 100 MG capsule TAKE TWO CAPSULES BY MOUTH THREE TIMES A DAY AS NEEDED (Patient taking differently: Take 200 mg by mouth 3 (three) times daily as needed for cough.) 30 capsule 2   Cholecalciferol 25 MCG (1000 UT) tablet Take 1,000 Units by mouth daily.      diphenoxylate-atropine (LOMOTIL) 2.5-0.025 MG tablet Take 1 tablet by mouth 4 (four) times daily as needed for diarrhea or loose stools. 60 tablet 1   famotidine (PEPCID) 20 MG tablet TAKE 1 TABLET BY MOUTH TWICE A DAY (Patient taking differently: Take 20 mg by mouth 2 (two) times daily.) 180 tablet 1   fluconazole (DIFLUCAN) 200 MG tablet Take 200 mg by mouth See admin instructions. Qd x 10 days     HYDROcodone-acetaminophen (NORCO) 10-325 MG tablet Take 1 tablet by mouth every 6 (six) hours as needed for moderate pain.     lidocaine-prilocaine (EMLA) cream Apply 1 application topically as needed (prior chemo/IBIG).     meclizine (ANTIVERT) 12.5 MG tablet Take 12.5 mg by mouth 3 (three) times daily as needed for dizziness.     metFORMIN (GLUCOPHAGE-XR) 500 MG 24 hr tablet Take 500 mg by mouth every evening.     midodrine (PROAMATINE) 5 MG tablet Take 1.5 tablets (7.5 mg total) by mouth 3 (three) times daily with meals. 135 tablet 1   montelukast (SINGULAIR) 10 MG tablet TAKE 1 TABLET BY MOUTH EVERY DAY (Patient taking differently: Take 10 mg by mouth daily.) 90 tablet 1   nitroGLYCERIN (NITROSTAT) 0.4 MG SL tablet PLACE 1 TABLET UNDER THE TONGUE EVERY 5 MINUTES X 3 DOSES AS NEEDED FOR CHEST PAIN *MAX 3 DOSES* (Patient taking differently: Place 0.4 mg under the tongue every 5 (five) minutes as needed for chest pain.) 25 tablet 3   pregabalin (LYRICA) 200 MG capsule TAKE 1  CAPSULE (200 MG TOTAL) BY MOUTH 3 (THREE) TIMES DAILY. 270 capsule 1   rosuvastatin (CRESTOR) 5 MG tablet Take 1 tablet (5 mg total) by mouth daily. 90 tablet 3   sildenafil (VIAGRA) 100 MG tablet Take 0.5 tablets (50 mg total) by mouth as needed for erectile dysfunction (30 mintues prior to sexual intercourse). 10 tablet 3   tamsulosin (FLOMAX) 0.4 MG CAPS capsule Take 1 capsule (0.4 mg total) by mouth daily. 30 capsule 0   zolpidem (AMBIEN) 10 MG tablet Take 10 mg by mouth at bedtime as needed for sleep.     No current facility-administered medications for this visit.   Facility-Administered Medications Ordered in Other Visits  Medication Dose Route Frequency Provider Last Rate Last Admin   0.9 %  sodium chloride infusion   Intravenous Continuous Alvy Bimler, , MD 50 mL/hr at 03/07/14 1005 New Bag at 03/07/14 1005   dextrose 5 % solution   Intravenous Continuous Alvy Bimler,  Clotilde Loth, MD 20 mL/hr at 07/24/20 0931 New Bag at 07/24/20 0931   heparin lock flush 100 unit/mL  500 Units Intracatheter Once Alvy Bimler, Adaria Hole, MD       Immune Globulin 10% (GAMUNEX-C) IV infusion 40 g  40 g Intravenous Once Alvy Bimler, Shawne Bulow, MD       sodium chloride 0.9 % injection 10 mL  10 mL Intracatheter PRN Marcy Panning, MD   10 mL at 08/22/12 1721   sodium chloride flush (NS) 0.9 % injection 10 mL  10 mL Intracatheter Once [name], MD        PHYSICAL EXAMINATION: ECOG PERFORMANCE STATUS: 1 - Symptomatic but completely ambulatory  Vitals:   07/24/20 0829  BP: (!) 99/58  Pulse: (!) 56  Resp: 18  Temp: 98.2 F (36.8 C)  SpO2: 100%   Filed Weights   07/24/20 0829  Weight: 163 lb 9.6 oz (74.2 kg)    GENERAL:alert, no distress and comfortable SKIN: He has significant skin discoloration due to chronic bruising EYES: normal, Conjunctiva are pink and non-injected, sclera clear OROPHARYNX:no exudate, no erythema and lips, buccal mucosa, and tongue normal  NECK: supple, thyroid normal size, non-tender, without  nodularity LYMPH:  no palpable lymphadenopathy in the cervical, axillary or inguinal LUNGS: clear to auscultation and percussion with normal breathing effort HEART: regular rate & rhythm and no murmurs and no lower extremity edema ABDOMEN:abdomen soft, non-tender and normal bowel sounds Musculoskeletal:no cyanosis of digits and no clubbing  NEURO: alert & oriented x 3 with fluent speech, no focal motor/sensory deficits  LABORATORY DATA:  I have reviewed the data as listed    Component Value Date/Time   NA 141 07/24/2020 0815   NA 142 06/19/2019 0848   NA 136 01/26/2017 0940   K 4.5 07/24/2020 0815   K 4.0 01/26/2017 0940   CL 107 07/24/2020 0815   CL 104 08/03/2012 1229   CO2 27 07/24/2020 0815   CO2 23 01/26/2017 0940   GLUCOSE 89 07/24/2020 0815   GLUCOSE 146 (H) 01/26/2017 0940   GLUCOSE 223 (H) 08/03/2012 1229   BUN 11 07/24/2020 0815   BUN 13 06/19/2019 0848   BUN 9.6 01/26/2017 0940   CREATININE 0.72 07/24/2020 0815   CREATININE 1.04 04/24/2018 0821   CREATININE 1.0 01/26/2017 0940   CALCIUM 8.6 (L) 07/24/2020 0815   CALCIUM 8.7 01/26/2017 0940   PROT 5.9 (L) 07/24/2020 0815   PROT 5.9 (L) 04/13/2017 0823   PROT 5.7 (L) 01/26/2017 0940   ALBUMIN 3.3 (L) 07/24/2020 0815   ALBUMIN 3.6 01/26/2017 0940   AST 19 07/24/2020 0815   AST 13 (L) 04/24/2018 0821   AST 13 01/26/2017 0940   ALT 16 07/24/2020 0815   ALT 10 04/24/2018 0821   ALT 12 01/26/2017 0940   ALKPHOS 121 07/24/2020 0815   ALKPHOS 78 01/26/2017 0940   BILITOT 0.3 07/24/2020 0815   BILITOT 0.7 04/24/2018 0821   BILITOT 0.64 01/26/2017 0940   GFRNONAA >60 07/24/2020 0815   GFRNONAA >60 04/24/2018 0821   GFRAA >60 10/25/2019 0819   GFRAA >60 04/24/2018 0821    No results found for: SPEP, UPEP  Lab Results  Component Value Date   WBC 7.1 07/24/2020   NEUTROABS 2.0 07/24/2020   HGB 11.6 (L) 07/24/2020   HCT 36.0 (L) 07/24/2020   MCV 95.0 07/24/2020   PLT 74 (L) 07/24/2020      Chemistry       Component Value Date/Time   NA 141 07/24/2020  0815   NA 142 06/19/2019 0848   NA 136 01/26/2017 0940   K 4.5 07/24/2020 0815   K 4.0 01/26/2017 0940   CL 107 07/24/2020 0815   CL 104 08/03/2012 1229   CO2 27 07/24/2020 0815   CO2 23 01/26/2017 0940   BUN 11 07/24/2020 0815   BUN 13 06/19/2019 0848   BUN 9.6 01/26/2017 0940   CREATININE 0.72 07/24/2020 0815   CREATININE 1.04 04/24/2018 0821   CREATININE 1.0 01/26/2017 0940      Component Value Date/Time   CALCIUM 8.6 (L) 07/24/2020 0815   CALCIUM 8.7 01/26/2017 0940   ALKPHOS 121 07/24/2020 0815   ALKPHOS 78 01/26/2017 0940   AST 19 07/24/2020 0815   AST 13 (L) 04/24/2018 0821   AST 13 01/26/2017 0940   ALT 16 07/24/2020 0815   ALT 10 04/24/2018 0821   ALT 12 01/26/2017 0940   BILITOT 0.3 07/24/2020 0815   BILITOT 0.7 04/24/2018 0821   BILITOT 0.64 01/26/2017 0940       RADIOGRAPHIC STUDIES: I have personally reviewed the radiological images as listed and agreed with the findings in the report. CT HEAD WO CONTRAST  Result Date: 07/18/2020 CLINICAL DATA:  Encephalopathy EXAM: CT HEAD WITHOUT CONTRAST TECHNIQUE: Contiguous axial images were obtained from the base of the skull through the vertex without intravenous contrast. COMPARISON:  None. FINDINGS: Brain: There is no mass, hemorrhage or extra-axial collection. The size and configuration of the ventricles and extra-axial CSF spaces are normal. There is hypoattenuation of the white matter, most commonly indicating chronic small vessel disease. Vascular: Atherosclerotic calcification of the internal carotid arteries at the skull base. No abnormal hyperdensity of the major intracranial arteries or dural venous sinuses. Skull: The visualized skull base, calvarium and extracranial soft tissues are normal. Sinuses/Orbits: Ethmoid and maxillary chronic sinusitis. The orbits are normal. IMPRESSION: 1. No acute intracranial abnormality. 2. Chronic sinusitis. Electronically Signed   By:  Ulyses Jarred M.D.   On: 07/18/2020 02:46   DG Chest Port 1 View  Result Date: 07/18/2020 CLINICAL DATA:  Altered mental status EXAM: PORTABLE CHEST 1 VIEW COMPARISON:  02/15/2020 FINDINGS: The heart size and mediastinal contours are within normal limits. Both lungs are clear. The visualized skeletal structures are unremarkable. There is a right chest wall power-injectable Port-A-Cath with tip at the cavoatrial junction via a right internal jugular vein approach. IMPRESSION: No active disease. Electronically Signed   By: Ulyses Jarred M.D.   On: 07/18/2020 01:57

## 2020-07-24 NOTE — Assessment & Plan Note (Signed)
He tolerated Calquence well except for bruising and mild anemia We will continue treatment as scheduled I recommend IVIG treatment along with aggressive supportive care So far, he has no hospitalization since we started IVIG

## 2020-07-24 NOTE — Assessment & Plan Note (Signed)
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

## 2020-07-24 NOTE — Patient Instructions (Signed)
[facility]  Discharge Instructions: Thank you for choosing [facility] to provide your oncology and hematology care.   If you have a lab appointment with the [facility], please go directly to the [facility] and check in at the registration area.   Wear comfortable clothing and clothing appropriate for easy access to any Portacath or PICC line.   We strive to give you quality time with your provider. You may need to reschedule your appointment if you arrive late (15 or more minutes).  Arriving late affects you and other patients whose appointments are after yours.  Also, if you miss three or more appointments without notifying the office, you may be dismissed from the clinic at the provider's discretion.      For prescription refill requests, have your pharmacy contact our office and allow 72 hours for refills to be completed.    Today you received the following medication - Gamunex (IVIG)      To help prevent nausea and vomiting after your treatment, we encourage you to take your nausea medication as directed.  BELOW ARE SYMPTOMS THAT SHOULD BE REPORTED IMMEDIATELY: *FEVER GREATER THAN 100.4 F (38 C) OR HIGHER *CHILLS OR SWEATING *NAUSEA AND VOMITING THAT IS NOT CONTROLLED WITH YOUR NAUSEA MEDICATION *UNUSUAL SHORTNESS OF BREATH *UNUSUAL BRUISING OR BLEEDING *URINARY PROBLEMS (pain or burning when urinating, or frequent urination) *BOWEL PROBLEMS (unusual diarrhea, constipation, pain near the anus) TENDERNESS IN MOUTH AND THROAT WITH OR WITHOUT PRESENCE OF ULCERS (sore throat, sores in mouth, or a toothache) UNUSUAL RASH, SWELLING OR PAIN  UNUSUAL VAGINAL DISCHARGE OR ITCHING   Items with * indicate a potential emergency and should be followed up as soon as possible or go to the Emergency Department if any problems should occur.  Please show the CHEMOTHERAPY ALERT CARD or IMMUNOTHERAPY ALERT CARD at check-in to the Emergency Department  and triage nurse.  Should you have questions after your visit or need to cancel or reschedule your appointment, please contact [facility]  Dept: [phone]  and follow the prompts.  Office hours are 8:00 a.m. to 4:30 p.m. Monday - Friday. Please note that voicemails left after 4:00 p.m. may not be returned until the following business day.  We are closed weekends and major holidays. You have access to a nurse at all times for urgent questions. Please call the main number to the clinic Dept: [phone] and follow the prompts.   For any non-urgent questions, you may also contact your provider using MyChart. We now offer e-Visits for anyone 44 and older to request care online for non-urgent symptoms. For details visit [email or web address].   Also download the MyChart app! Go to the app store, search "MyChart", open the app, select [facility], and log in with your MyChart username and password.  Due to Covid, a mask is required upon entering the hospital/clinic. If you do not have a mask, one will be given to you upon arrival. For doctor visits, patients may have 1 support person aged 91 or older with them. For treatment visits, patients cannot have anyone with them due to current Covid guidelines and our immunocompromised population.   Immune Globulin Injection What is this medication? IMMUNE GLOBULIN (im MUNE GLOB yoo lin) helps to prevent or reduce the severity of certain infections in patients who are at risk. This medicine is collected from the pooled blood of many donors. It is used to treat immune systemproblems, thrombocytopenia, and  Kawasaki syndrome. This medicine may be used for other purposes; ask your health care provider orpharmacist if you have questions. COMMON BRAND NAME(S): ASCENIV, Baygam, BIVIGAM, Carimune, Carimune NF, cutaquig, Cuvitru, Flebogamma, Flebogamma DIF, GamaSTAN, GamaSTAN S/D, Gamimune N, Gammagard, Gammagard S/D, Gammaked, Gammaplex,  Gammar-P IV, Gamunex, Gamunex-C, Hizentra, Iveegam, Iveegam EN, Octagam, Panglobulin, Panglobulin NF, panzyga, Polygam S/D, Privigen, Sandoglobulin, Venoglobulin-S, Vigam,Vivaglobulin, Xembify What should I tell my care team before I take this medication? They need to know if you have any of these conditions: diabetes extremely low or no immune antibodies in the blood heart disease history of blood clots hyperprolinemia infection in the blood, sepsis kidney disease recently received or scheduled to receive a vaccination an unusual or allergic reaction to human immune globulin, albumin, maltose, sucrose, other medicines, foods, dyes, or preservatives pregnant or trying to get pregnant breast-feeding How should I use this medication? This medicine is for injection into a muscle or infusion into a vein or skin. It is usually given by a health care professional in a hospital or clinicsetting. In rare cases, some brands of this medicine might be given at home. You will be taught how to give this medicine. Use exactly as directed. Take your medicineat regular intervals. Do not take your medicine more often than directed. Talk to your pediatrician regarding the use of this medicine in children. Whilethis drug may be prescribed for selected conditions, precautions do apply. Overdosage: If you think you have taken too much of this medicine contact apoison control center or emergency room at once. NOTE: This medicine is only for you. Do not share this medicine with others. What if I miss a dose? It is important not to miss your dose. Call your doctor or health care professional if you are unable to keep an appointment. If you give yourself the medicine and you miss a dose, take it as soon as you can. If it is almost timefor your next dose, take only that dose. Do not take double or extra doses. What may interact with this medication? aspirin and aspirin-like medicines cisplatin cyclosporine medicines  for infection like acyclovir, adefovir, amphotericin B, bacitracin, cidofovir, foscarnet, ganciclovir, gentamicin, pentamidine, vancomycin NSAIDS, medicines for pain and inflammation, like ibuprofen or naproxen pamidronate vaccines zoledronic acid This list may not describe all possible interactions. Give your health care provider a list of all the medicines, herbs, non-prescription drugs, or dietary supplements you use. Also tell them if you smoke, drink alcohol, or use illegaldrugs. Some items may interact with your medicine. What should I watch for while using this medication? Your condition will be monitored carefully while you are receiving thismedicine. This medicine is made from pooled blood donations of many different people. It may be possible to pass an infection in this medicine. However, the donors are screened for infections and all products are tested for HIV and hepatitis. The medicine is treated to kill most or all bacteria and viruses. Talk to yourdoctor about the risks and benefits of this medicine. Do not have vaccinations for at least 14 days before, or until at least 30months after receiving this medicine. What side effects may I notice from receiving this medication? Side effects that you should report to your doctor or health care professionalas soon as possible: allergic reactions like skin rash, itching or hives, swelling of the face, lips, or tongue blue colored lips or skin breathing problems chest pain or tightness fever signs and symptoms of aseptic meningitis such as stiff neck; sensitivity to light; headache; drowsiness;  fever; nausea; vomiting; rash signs and symptoms of a blood clot such as chest pain; shortness of breath; pain, swelling, or warmth in the leg signs and symptoms of hemolytic anemia such as fast heartbeat; tiredness; dark yellow or brown urine; or yellowing of the eyes or skin signs and symptoms of kidney injury like trouble passing urine or change  in the amount of urine sudden weight gain swelling of the ankles, feet, hands Side effects that usually do not require medical attention (report to yourdoctor or health care professional if they continue or are bothersome): diarrhea flushing headache increased sweating joint pain muscle cramps muscle pain nausea pain, redness, or irritation at site where injected tiredness This list may not describe all possible side effects. Call your doctor for medical advice about side effects. You may report side effects to FDA at1-800-FDA-1088. Where should I keep my medication? Keep out of the reach of children. This drug is usually given in a hospital or clinic and will not be stored athome. In rare cases, some brands of this medicine may be given at home. If you are using this medicine at home, you will be instructed on how to store thismedicine. Throw away any unused medicine after the expiration date on the label. NOTE: This sheet is a summary. It may not cover all possible information. If you have questions about this medicine, talk to your doctor, pharmacist, orhealth care provider.  2022 Elsevier/Gold Standard (2018-09-05 12:51:14)

## 2020-07-24 NOTE — Assessment & Plan Note (Signed)
We discussed monthly IVIG for the next few months and he is in agreement to proceed

## 2020-07-27 ENCOUNTER — Other Ambulatory Visit: Payer: Self-pay

## 2020-07-27 ENCOUNTER — Encounter: Payer: Self-pay | Admitting: Internal Medicine

## 2020-07-27 ENCOUNTER — Ambulatory Visit (INDEPENDENT_AMBULATORY_CARE_PROVIDER_SITE_OTHER): Payer: Medicare Other | Admitting: Internal Medicine

## 2020-07-27 VITALS — BP 108/60 | HR 74 | Temp 97.5°F | Ht 72.0 in | Wt 162.6 lb

## 2020-07-27 DIAGNOSIS — R911 Solitary pulmonary nodule: Secondary | ICD-10-CM

## 2020-07-27 DIAGNOSIS — C9110 Chronic lymphocytic leukemia of B-cell type not having achieved remission: Secondary | ICD-10-CM

## 2020-07-27 DIAGNOSIS — J449 Chronic obstructive pulmonary disease, unspecified: Secondary | ICD-10-CM | POA: Diagnosis not present

## 2020-07-27 DIAGNOSIS — R599 Enlarged lymph nodes, unspecified: Secondary | ICD-10-CM | POA: Diagnosis not present

## 2020-07-27 NOTE — Patient Instructions (Addendum)
The patient should have follow up scheduled with myself in 6 months.   Prior to next visit patient should have: CT scan in August - we will call and schedule for you.

## 2020-07-27 NOTE — Progress Notes (Signed)
Ryan Copeland    [id number]    [birth year]  Primary Care Physician:Kaplan, Baldemar Friday., PA-C Date of Appointment: 04/01/2020 Established Patient Visit  Chief complaint:   Chief Complaint  Patient presents with   Follow-up    Pt stated that he is doing well.  He has the breo but is not using as he feels that he does not need this.     HPI: Ryan Copeland is a 70 y.o.  former smoker quit in 2018 (44-pack-year history).  Past medical history significant for COPD mixed type, chronic sinusitis, community-acquired bacterial pneumonia, hypertension, MI, chronic diastolic heart failure, GERD, chronic inflammatory demyelinating polyneuropathy, diabetes mellitus, small cell B-cell lymphoma.    Follows with oncology for small cell B-cell lymphoma.  Interval Updates: Hospitalized for pseudomonas UTI. Has had his suprapubic catheter placed. On 21 days worth of cipro for now. Breathing is fine. Off all inhaler therapy at this time. 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 Chronic thrombocytopenia and splenomegaly.   I have reviewed the patient's family social and past medical history and updated as appropriate.   Past Medical History:  Diagnosis Date   Anxiety 01/30/2014   Back injury    lower disc   CAD (coronary artery disease)    CLL (chronic lymphocytic leukemia) ([facility]) 03/18/2011   COPD (chronic obstructive pulmonary disease) (HCC)    Diabetes mellitus (HCC)    Type 2    ECRB (extensor carpi radialis brevis) tenosynovitis    GERD (gastroesophageal reflux disease)    takes Nexium if needed   Hyperlipidemia    Lateral epicondylitis of left elbow    MI, acute, non ST segment elevation ([facility]) 06/28/2009   with stenting of the LAD   Neuromuscular disorder ([facility])    peripheral neuropathy   PVD (peripheral vascular disease) ([facility])    Tobacco abuse    UTI (urinary tract infection)     Past Surgical History:  Procedure Laterality Date   ADENOIDECTOMY  1955    CARDIAC CATHETERIZATION     CARDIAC CATHETERIZATION N/A 09/26/2014   Procedure: Left Heart Cath and Coronary Angiography;  Surgeon: Peter M Martinique, MD;  Location: [facility];  Service: Cardiovascular;  Laterality: N/A;   carpel tunnel release Left 04-1989   carpel tunnel release  Right 01-1989   CHOLECYSTECTOMY  2007   CORONARY STENT PLACEMENT  May 2011   femoral stents     IR CV LINE INJECTION  08/18/2017   IR CV LINE INJECTION  09/01/2017   IR CV LINE INJECTION  02/02/2018   LATERAL EPICONDYLE RELEASE Left 02/12/2014   Procedure: LEFT ELBOW DEBRIDEMENT WITH TENDON REPAIR ;  Surgeon: Lorn Junes, MD;  Location: [facility];  Service: Orthopedics;  Laterality: Left;   LEFT CAI STENT/PTA AND POPLITEAL ARTERY/TIBIAL THROMBECTOMY      LEFT HEART CATHETERIZATION WITH CORONARY ANGIOGRAM N/A 08/26/2011   Procedure: LEFT HEART CATHETERIZATION WITH CORONARY ANGIOGRAM;  Surgeon: Peter M Martinique, MD;  Location[facility];  Service: Cardiovascular;  Laterality: N/A;   PERIPHERAL VASCULAR CATHETERIZATION N/A 01/01/2015   Procedure: Abdominal Aortogram;  Surgeon: Conrad Manuel Garcia, MD;  Location: [facility];  Service: Cardiovascular;  Laterality: N/A;   TARSAL TUNNEL RELEASE Bilateral 08-2007    Family History  Problem Relation Age of Onset   Lung cancer Mother 35   Cancer Mother        lung  Heart failure Father 17   Heart disease Father     Social History   Occupational History    Employer: [employer]    Comment: [employer]  Tobacco Use   Smoking status: Former Smoker    Packs/day: 1.00    Years: 44.00    Pack years: 44.00    Types: Cigarettes    Quit date: 04/23/2016    Years since quitting: 3.9   Smokeless tobacco: Former [employer] Use: Never used  Substance and Sexual Activity   Alcohol use: No    Alcohol/week: 0.0 standard drinks    Comment:  drinks non-alcoholic beer   Drug use: No   Sexual activity: Yes     Physical Exam: Blood  pressure 108/60, pulse 74, temperature (!) 97.5 F (36.4 C), temperature source Temporal, height 6' (1.829 m), weight 162 lb 9.6 oz (73.8 kg), SpO2 97 %.   Gen:      No distress Lungs:   diminished, clear no wheezes or crackles CV:         RRR, no mrg   Data Reviewed: Imaging: CT Chest from Feb 2022 shows enlarging mediastinal lymph node which may be growing into the lung parenchyma   PFTs: I have personally reviewed the patient's PFTs and spirometry is normal. No BD response. FeNO 57 ppb.  PFTs:  PFT Results Latest Ref Rng & Units 06/27/2019  FVC-Pre L 3.62  FVC-Predicted Pre % 77  FVC-Post L 3.70  FVC-Predicted Post % 79  Pre FEV1/FVC % % 75  Post FEV1/FCV % % 76  FEV1-Pre L 2.72  FEV1-Predicted Pre % 78  FEV1-Post L 2.82    Labs: Lab Results  Component Value Date   NA 144 03/06/2020   K 4.0 03/06/2020   CL 110 03/06/2020   CO2 24 03/06/2020   Lab Results  Component Value Date   WBC 13.3 (H) 03/06/2020   HGB 11.3 (L) 03/06/2020   HCT 34.5 (L) 03/06/2020   MCV 102.1 (H) 03/06/2020   PLT 58 (L) 03/06/2020   Immunization status: Immunization History  Administered Date(s) Administered   Fluad Quad(high Dose 65+) 11/23/2018, 01/11/2020   Influenza Split 11/12/2013, 12/04/2014, 11/13/2015, 11/03/2016, 11/08/2017   Influenza, High Dose Seasonal PF 11/23/2018, 01/11/2020   Influenza,inj,Quad PF,6+ Mos 11/13/2015, 11/03/2016   Influenza,inj,quad, With Preservative 11/13/2015   Influenza-Unspecified 11/12/2013, 12/04/2014, 11/08/2017   PFIZER(Purple Top)SARS-COV-2 Vaccination 10/02/2019, 10/23/2019   Pneumococcal Conjugate-13 06/26/2013, 01/02/2015   Pneumococcal Polysaccharide-23 06/26/2013, 05/16/2017   Tdap 02/11/2009    Assessment:  Chronic CLL - on Acalabrutinib  Centrilobular Emphysema - stable Hypogammaglobulinemia - on IVIG History of Tobacco Use Disorder Seasonal allergic rhinitis Enlarged mediastinal lymph node  Plan/Recommendations: Continue to  monitor off inhaler therapy for COPD. Will repeat a CT scan for 6 months from previous (due August 2022) for lymph node. Continue flonase as needed for rhinitis.   Return to Care: Return in about 4 months (around 07/30/2020).   Lenice Llamas, MD Pulmonary and [facility]

## 2020-07-30 ENCOUNTER — Ambulatory Visit: Payer: Medicare Other | Admitting: Internal Medicine

## 2020-07-31 ENCOUNTER — Other Ambulatory Visit (HOSPITAL_COMMUNITY): Payer: Self-pay

## 2020-07-31 MED FILL — Acalabrutinib Cap 100 MG: ORAL | 30 days supply | Qty: 60 | Fill #2 | Status: AC

## 2020-08-04 ENCOUNTER — Other Ambulatory Visit (HOSPITAL_COMMUNITY): Payer: Self-pay

## 2020-08-04 ENCOUNTER — Encounter: Payer: Self-pay | Admitting: Hematology and Oncology

## 2020-08-06 ENCOUNTER — Other Ambulatory Visit (HOSPITAL_COMMUNITY): Payer: Self-pay

## 2020-08-14 ENCOUNTER — Encounter: Payer: Self-pay | Admitting: Hematology and Oncology

## 2020-08-18 ENCOUNTER — Other Ambulatory Visit: Payer: Self-pay

## 2020-08-18 ENCOUNTER — Other Ambulatory Visit (HOSPITAL_COMMUNITY): Payer: Self-pay

## 2020-08-18 MED ORDER — NITROGLYCERIN 0.4 MG SL SUBL: SUBLINGUAL_TABLET | SUBLINGUAL | 7 refills | Status: AC

## 2020-08-21 ENCOUNTER — Other Ambulatory Visit: Payer: Self-pay

## 2020-08-21 ENCOUNTER — Inpatient Hospital Stay: Payer: Medicare Other | Attending: Hematology and Oncology | Admitting: Hematology and Oncology

## 2020-08-21 ENCOUNTER — Encounter: Payer: Self-pay | Admitting: Hematology and Oncology

## 2020-08-21 ENCOUNTER — Inpatient Hospital Stay: Payer: Medicare Other

## 2020-08-21 VITALS — BP 88/48 | HR 50 | Temp 98.0°F | Resp 17

## 2020-08-21 VITALS — BP 98/53 | HR 65 | Temp 98.1°F | Resp 18 | Wt 165.4 lb

## 2020-08-21 DIAGNOSIS — C9110 Chronic lymphocytic leukemia of B-cell type not having achieved remission: Secondary | ICD-10-CM | POA: Diagnosis not present

## 2020-08-21 DIAGNOSIS — Z95828 Presence of other vascular implants and grafts: Secondary | ICD-10-CM

## 2020-08-21 DIAGNOSIS — D801 Nonfamilial hypogammaglobulinemia: Secondary | ICD-10-CM | POA: Diagnosis present

## 2020-08-21 DIAGNOSIS — R591 Generalized enlarged lymph nodes: Secondary | ICD-10-CM | POA: Diagnosis not present

## 2020-08-21 DIAGNOSIS — C8308 Small cell B-cell lymphoma, lymph nodes of multiple sites: Secondary | ICD-10-CM

## 2020-08-21 DIAGNOSIS — D61818 Other pancytopenia: Secondary | ICD-10-CM

## 2020-08-21 LAB — CBC WITH DIFFERENTIAL/PLATELET
Abs Immature Granulocytes: 0.02 10*3/uL (ref 0.00–0.07)
Basophils Absolute: 0 10*3/uL (ref 0.0–0.1)
Basophils Relative: 0 %
Eosinophils Absolute: 0.1 10*3/uL (ref 0.0–0.5)
Eosinophils Relative: 1 %
HCT: 35.1 % — ABNORMAL LOW (ref 39.0–52.0)
Hemoglobin: 11.3 g/dL — ABNORMAL LOW (ref 13.0–17.0)
Immature Granulocytes: 0 %
Lymphocytes Relative: 32 %
Lymphs Abs: 1.7 10*3/uL (ref 0.7–4.0)
MCH: 29.1 pg (ref 26.0–34.0)
MCHC: 32.2 g/dL (ref 30.0–36.0)
MCV: 90.5 fL (ref 80.0–100.0)
Monocytes Absolute: 0.4 10*3/uL (ref 0.1–1.0)
Monocytes Relative: 8 %
Neutro Abs: 3 10*3/uL (ref 1.7–7.7)
Neutrophils Relative %: 59 %
Platelets: 77 10*3/uL — ABNORMAL LOW (ref 150–400)
RBC: 3.88 MIL/uL — ABNORMAL LOW (ref 4.22–5.81)
RDW: 16.6 % — ABNORMAL HIGH (ref 11.5–15.5)
WBC: 5.2 10*3/uL (ref 4.0–10.5)
nRBC: 0 % (ref 0.0–0.2)

## 2020-08-21 LAB — COMPREHENSIVE METABOLIC PANEL
ALT: 25 U/L (ref 0–44)
AST: 25 U/L (ref 15–41)
Albumin: 3.4 g/dL — ABNORMAL LOW (ref 3.5–5.0)
Alkaline Phosphatase: 110 U/L (ref 38–126)
Anion gap: 7 (ref 5–15)
BUN: 13 mg/dL (ref 8–23)
CO2: 25 mmol/L (ref 22–32)
Calcium: 8.6 mg/dL — ABNORMAL LOW (ref 8.9–10.3)
Chloride: 110 mmol/L (ref 98–111)
Creatinine, Ser: 0.69 mg/dL (ref 0.61–1.24)
GFR, Estimated: >60 mL/min (ref >60)
Glucose, Bld: 107 mg/dL — ABNORMAL HIGH (ref 70–99)
Potassium: 4.2 mmol/L (ref 3.5–5.1)
Sodium: 142 mmol/L (ref 135–145)
Total Bilirubin: 0.4 mg/dL (ref 0.3–1.2)
Total Protein: 5.9 g/dL — ABNORMAL LOW (ref 6.5–8.1)

## 2020-08-21 MED ORDER — ACETAMINOPHEN 325 MG PO TABS
650.0000 mg | ORAL_TABLET | Freq: Once | ORAL | Status: AC
  Administered 2020-08-21: 650 mg via ORAL

## 2020-08-21 MED ORDER — SODIUM CHLORIDE 0.9% FLUSH
10.0000 mL | Freq: Once | INTRAVENOUS | Status: AC
  Administered 2020-08-21: 10 mL
  Filled 2020-08-21: qty 10

## 2020-08-21 MED ORDER — DEXTROSE 5 % IV SOLN
Freq: Once | INTRAVENOUS | Status: AC
Start: 2020-08-21 — End: 2020-08-21
  Filled 2020-08-21: qty 250

## 2020-08-21 MED ORDER — DIPHENHYDRAMINE HCL 25 MG PO CAPS
ORAL_CAPSULE | ORAL | Status: AC
  Filled 2020-08-21: qty 1

## 2020-08-21 MED ORDER — IMMUNE GLOBULIN (HUMAN) 10 GM/100ML IJ SOLN
0.5000 g/kg | Freq: Once | INTRAMUSCULAR | Status: AC
  Administered 2020-08-21: 40 g via INTRAVENOUS
  Filled 2020-08-21: qty 400

## 2020-08-21 MED ORDER — METHYLPREDNISOLONE SODIUM SUCC 40 MG IJ SOLR
40.0000 mg | Freq: Once | INTRAMUSCULAR | Status: AC
  Administered 2020-08-21: 40 mg via INTRAVENOUS

## 2020-08-21 MED ORDER — DIPHENHYDRAMINE HCL 25 MG PO TABS
25.0000 mg | ORAL_TABLET | Freq: Once | ORAL | Status: AC
Start: 2020-08-21 — End: 2020-08-21
  Administered 2020-08-21: 25 mg via ORAL
  Filled 2020-08-21: qty 1

## 2020-08-21 MED ORDER — ACETAMINOPHEN 325 MG PO TABS
ORAL_TABLET | ORAL | Status: AC
  Filled 2020-08-21: qty 2

## 2020-08-21 MED ORDER — METHYLPREDNISOLONE SODIUM SUCC 40 MG IJ SOLR
INTRAMUSCULAR | Status: AC
  Filled 2020-08-21: qty 1

## 2020-08-21 NOTE — Assessment & Plan Note (Signed)
He tolerated Calquence well except for bruising and mild anemia We will continue treatment as scheduled I recommend IVIG treatment along with aggressive supportive care So far, he has no hospitalization since we started IVIG I plan to order CT imaging before his next treatment

## 2020-08-21 NOTE — Patient Instructions (Signed)
Immune Globulin Injection What is this medication? IMMUNE GLOBULIN (im MUNE GLOB yoo lin) helps to prevent or reduce the severity of certain infections in patients who are at risk. This medicine is collected from the pooled blood of many donors. It is used to treat immune systemproblems, thrombocytopenia, and Kawasaki syndrome. This medicine may be used for other purposes; ask your health care provider orpharmacist if you have questions. COMMON BRAND NAME(S): ASCENIV, Baygam, BIVIGAM, Carimune, Carimune NF, cutaquig, Cuvitru, Flebogamma, Flebogamma DIF, GamaSTAN, GamaSTAN S/D, Gamimune N, Gammagard, Gammagard S/D, Gammaked, Gammaplex, Gammar-P IV, Gamunex, Gamunex-C, Hizentra, Iveegam, Iveegam EN, Octagam, Panglobulin, Panglobulin NF, panzyga, Polygam S/D, Privigen, Sandoglobulin, Venoglobulin-S, Vigam,Vivaglobulin, Xembify What should I tell my care team before I take this medication? They need to know if you have any of these conditions: diabetes extremely low or no immune antibodies in the blood heart disease history of blood clots hyperprolinemia infection in the blood, sepsis kidney disease recently received or scheduled to receive a vaccination an unusual or allergic reaction to human immune globulin, albumin, maltose, sucrose, other medicines, foods, dyes, or preservatives pregnant or trying to get pregnant breast-feeding How should I use this medication? This medicine is for injection into a muscle or infusion into a vein or skin. It is usually given by a health care professional in a hospital or clinicsetting. In rare cases, some brands of this medicine might be given at home. You will be taught how to give this medicine. Use exactly as directed. Take your medicineat regular intervals. Do not take your medicine more often than directed. Talk to your pediatrician regarding the use of this medicine in children. Whilethis drug may be prescribed for selected conditions, precautions do  apply. Overdosage: If you think you have taken too much of this medicine contact apoison control center or emergency room at once. NOTE: This medicine is only for you. Do not share this medicine with others. What if I miss a dose? It is important not to miss your dose. Call your doctor or health care professional if you are unable to keep an appointment. If you give yourself the medicine and you miss a dose, take it as soon as you can. If it is almost timefor your next dose, take only that dose. Do not take double or extra doses. What may interact with this medication? aspirin and aspirin-like medicines cisplatin cyclosporine medicines for infection like acyclovir, adefovir, amphotericin B, bacitracin, cidofovir, foscarnet, ganciclovir, gentamicin, pentamidine, vancomycin NSAIDS, medicines for pain and inflammation, like ibuprofen or naproxen pamidronate vaccines zoledronic acid This list may not describe all possible interactions. Give your health care provider a list of all the medicines, herbs, non-prescription drugs, or dietary supplements you use. Also tell them if you smoke, drink alcohol, or use illegaldrugs. Some items may interact with your medicine. What should I watch for while using this medication? Your condition will be monitored carefully while you are receiving thismedicine. This medicine is made from pooled blood donations of many different people. It may be possible to pass an infection in this medicine. However, the donors are screened for infections and all products are tested for HIV and hepatitis. The medicine is treated to kill most or all bacteria and viruses. Talk to yourdoctor about the risks and benefits of this medicine. Do not have vaccinations for at least 14 days before, or until at least 3months after receiving this medicine. What side effects may I notice from receiving this medication? Side effects that you should report to your doctor   or health care  professionalas soon as possible: allergic reactions like skin rash, itching or hives, swelling of the face, lips, or tongue blue colored lips or skin breathing problems chest pain or tightness fever signs and symptoms of aseptic meningitis such as stiff neck; sensitivity to light; headache; drowsiness; fever; nausea; vomiting; rash signs and symptoms of a blood clot such as chest pain; shortness of breath; pain, swelling, or warmth in the leg signs and symptoms of hemolytic anemia such as fast heartbeat; tiredness; dark yellow or brown urine; or yellowing of the eyes or skin signs and symptoms of kidney injury like trouble passing urine or change in the amount of urine sudden weight gain swelling of the ankles, feet, hands Side effects that usually do not require medical attention (report to yourdoctor or health care professional if they continue or are bothersome): diarrhea flushing headache increased sweating joint pain muscle cramps muscle pain nausea pain, redness, or irritation at site where injected tiredness This list may not describe all possible side effects. Call your doctor for medical advice about side effects. You may report side effects to FDA at1-800-FDA-1088. Where should I keep my medication? Keep out of the reach of children. This drug is usually given in a hospital or clinic and will not be stored athome. In rare cases, some brands of this medicine may be given at home. If you are using this medicine at home, you will be instructed on how to store thismedicine. Throw away any unused medicine after the expiration date on the label. NOTE: This sheet is a summary. It may not cover all possible information. If you have questions about this medicine, talk to your doctor, pharmacist, orhealth care provider.  2022 Elsevier/Gold Standard (2018-09-05 12:51:14)  

## 2020-08-21 NOTE — Assessment & Plan Note (Signed)
Could be due to residual disease from CLL I will order CT neck for evaluation as well

## 2020-08-21 NOTE — Assessment & Plan Note (Signed)
We discussed monthly IVIG for the next few months and he is in agreement to proceed

## 2020-08-21 NOTE — Assessment & Plan Note (Signed)
He has multifactorial pancytopenia He is known to have splenomegaly 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 He has mild anemia chronic illness Observe closely for now

## 2020-08-21 NOTE — Progress Notes (Signed)
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 [facility]) He tolerated Calquence well except for bruising and mild anemia We will continue treatment as scheduled I recommend IVIG treatment along with aggressive supportive care So far, he has no hospitalization since we started IVIG I plan to order CT imaging before his next treatment  Pancytopenia, acquired [facility]) He has multifactorial 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    Answer:   [facility]    Order Specific Question:   Radiology Contrast Protocol - do NOT remove file path    Answer:   [email or web address]   CT Soft Tissue Neck W Contrast    Standing Status:   Future    Standing Expiration Date:   08/21/2021    Order Specific Question:   If indicated for the ordered procedure, I authorize the administration of contrast media per Radiology protocol    Answer:   Yes    Order Specific Question:   Preferred imaging location?    Answer:   [facility]    All questions were answered. The patient knows to call the clinic with any problems,  questions or concerns. The total time spent in the appointment was 30 minutes encounter with patients including review of chart and various tests results, discussions about plan of care and coordination of care plan   [name], MD 08/21/2020 9:37 AM  INTERVAL HISTORY: Please see below for problem oriented charting. He returns for treatment follow-up He complained of palpable lymphadenopathy in his neck Skin bruises easily but stable His chronic diarrhea is stable He denies symptoms from hypotension The patient denies any recent signs or symptoms of bleeding such as spontaneous epistaxis, hematuria or hematochezia.   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  No evidence of aortic dissection or other acute process in the chest. 2.  Centrilobular emphysema with a 5 mm right lung nodule. Given the concurrent centrilobular emphysema, follow-up chest CT at 6 -12 months is recommended.  3.  Coronary artery atherosclerosis which is age advanced. 4.  Prominent thoracic lymph nodes.  These can be reevaluated at follow-up.    05/06/2010 Imaging   1.  Multiple small periaortic lymph nodes consistent with the patient's history of the chronic lymphocytic leukemia. 2.  No evidence of solid organ involvement    11/03/2011 Imaging   1.  Interval progression of abdominal and pelvic adenopathy. 2.  Progression of splenomegaly.  The spleen now measures 23  cm in length    11/18/2011 Bone Marrow Biopsy   Bone Marrow, Aspirate,Biopsy, and Clot, right iliac bone - HYPERCELLULAR BONE MARROW WITH EXTENSIVE INVOLVEMENT BY CHRONIC LYMPHOCYTIC LEUKEMIA. PERIPHERAL BLOOD: - CHRONIC LYMPHOCYTIC LEUKEMIA    03/08/2012 Imaging   1.  Progressive increase in retroperitoneal, iliac, and inguinal lymphadenopathy. 2.  Interval  increase in massive splenomegaly.      06/01/2012 Procedure   Placement of single lumen port a cath via right internal jugular vein.  The catheter tip lies at the cavoatrial junction.  A power injectable port a cath was placed and is ready for immediate use    06/20/2012 - 11/23/2012 Chemotherapy   He received FCR x 6 cycles    12/19/2012 Imaging   Left common iliac stent. Abdominal vasculature remains patent. Improving supraclavicular and axillary lymphadenopathy. Residual right subpectoral nodes measure up to 10 mm short axis. Improving retroperitoneal lymphadenopathy, measuring up to 16 mm short axis. Improving splenomegaly, measuring 18.7 cm.      01/20/2016 Imaging   1. Stable exam.  No new or progressive findings. 2. No CT findings to explain odynophagia    09/02/2016 Pathology Results   The findings are consistent with involvement by previously known chronic lymphocytic leukemia    09/02/2016 Pathology Results   FISH for CLL came back positive for deletion 13q    09/15/2016 Imaging   1. Borderline enlarged abdominal and pelvic lymph nodes. Compared with 11/03/2011 these are decreased in size as detailed above. 2. Persistent splenomegaly. 3. Aortic Atherosclerosis (ICD10-I70.0). LAD coronary artery calcification noted.    10/06/2016 - 02/24/2017 Chemotherapy   He received Bendamustine and Rituxan    11/03/2016 Adverse Reaction   Dose of Bendamustine is reduced due to severe pancytopenia    12/28/2016 Imaging   1. Borderline enlarged abdominal peritoneal ligament and abdominal retroperitoneal lymph nodes, stable. 2. Splenomegaly. 3.  Aortic atherosclerosis (ICD10-170.0).    03/22/2017 PET scan   1. No hypermetabolic adenopathy identified within the neck, chest, abdomen or pelvis. 2. Prominent left retroperitoneal node measures 1.6 cm without significant FDG uptake. 3. Splenomegaly. 4. Aortic Atherosclerosis (ICD10-I70.0) and Emphysema (ICD10-J43.9). LAD and left  circumflex atherosclerotic calcifications noted.    02/02/2018 Procedure   IMPRESSION: Widely patent right IJ power port catheter.    04/06/2018 Imaging   1. Interval enlargement of axillary, mediastinal, and retroperitoneal lymph nodes, as well as increased splenomegaly, findings concerning for progression of CLL in comparison to prior PET-CT dated 03/22/2017.   2.  Other chronic and incidental findings as detailed above.      04/16/2018 -  Chemotherapy   The patient had acalabrutinib for chemo    04/17/2018 - 04/21/2018 Hospital Admission   He was hospitalized for influenza    11/29/2018 Imaging   1. Interval response to therapy. No thoracic added not scratch set no thoracic or pelvic adenopathy identified. Significant improvement in abdominal adenopathy. Largest remaining lymph node measures 1.3cm in the left retroperitoneal region. Previously 2.5 cm. 2. Persistent splenomegaly, improved from previous exam. 3. No new or progressive disease identified within the chest, abdomen or pelvis. 4. Stable appearance of 5 mm right middle lobe lung nodule. 5. Aortic Atherosclerosis (ICD10-I70.0) and Emphysema (ICD10-J43.9). Coronary artery calcifications.       REVIEW OF SYSTEMS:   Constitutional: Denies fevers, chills or abnormal weight loss Eyes: Denies blurriness of vision Ears, nose, mouth, throat, and face: Denies mucositis or sore throat Respiratory: Denies cough, dyspnea or wheezes Cardiovascular: Denies palpitation, chest discomfort or lower  extremity swelling Gastrointestinal:  Denies nausea, heartburn or change in bowel habits Skin: Denies abnormal skin rashes Neurological:Denies numbness, tingling or new weaknesses Behavioral/Psych: Mood is stable, no new changes  All other systems were reviewed with the patient and are negative.  I have reviewed the past medical history, past surgical history, social history and family history with the patient and they are unchanged from  previous note.  ALLERGIES:  is allergic to immune globulin and codeine.  MEDICATIONS:  Current Outpatient Medications  Medication Sig Dispense Refill   acalabrutinib (CALQUENCE) 100 MG capsule TAKE 1 CAPSULE BY MOUTH TWICE A DAY (Patient taking differently: Take 100 mg by mouth 2 (two) times daily.) 60 capsule 11   acetaminophen (TYLENOL) 325 MG tablet Take 2 tablets (650 mg total) by mouth every 6 (six) hours as needed for moderate pain or fever. 30 tablet 0   acyclovir (ZOVIRAX) 400 MG tablet TAKE 1 TABLET BY MOUTH TWICE A DAY (Patient taking differently: Take 400 mg by mouth 2 (two) times daily.) 180 tablet 1   allopurinol (ZYLOPRIM) 300 MG tablet TAKE 1 TABLET BY MOUTH EVERY DAY (Patient taking differently: Take 300 mg by mouth daily.) 90 tablet 1   ALPRAZolam (XANAX) 0.5 MG tablet Take 0.5 mg by mouth 2 (two) times daily as needed for anxiety.     aspirin EC 81 MG tablet Take 81 mg by mouth daily. Swallow whole.     b complex vitamins tablet Take 1 tablet by mouth daily.      benzonatate (TESSALON) 100 MG capsule TAKE TWO CAPSULES BY MOUTH THREE TIMES A DAY AS NEEDED (Patient taking differently: Take 200 mg by mouth 3 (three) times daily as needed for cough.) 30 capsule 2   Cholecalciferol 25 MCG (1000 UT) tablet Take 1,000 Units by mouth daily.      diphenoxylate-atropine (LOMOTIL) 2.5-0.025 MG tablet Take 1 tablet by mouth 4 (four) times daily as needed for diarrhea or loose stools. 60 tablet 1   famotidine (PEPCID) 20 MG tablet TAKE 1 TABLET BY MOUTH TWICE A DAY (Patient taking differently: Take 20 mg by mouth 2 (two) times daily.) 180 tablet 1   HYDROcodone-acetaminophen (NORCO) 10-325 MG tablet Take 1 tablet by mouth every 6 (six) hours as needed for moderate pain.     lidocaine-prilocaine (EMLA) cream Apply 1 application topically as needed (prior chemo/IBIG).     loperamide (IMODIUM) 2 MG capsule Take 2 capsules (4 mg total) by mouth 4 (four) times daily. 120 capsule 9   meclizine  (ANTIVERT) 12.5 MG tablet Take 12.5 mg by mouth 3 (three) times daily as needed for dizziness.     metFORMIN (GLUCOPHAGE-XR) 500 MG 24 hr tablet Take 500 mg by mouth every evening.     midodrine (PROAMATINE) 5 MG tablet Take 1.5 tablets (7.5 mg total) by mouth 3 (three) times daily with meals. 135 tablet 1   montelukast (SINGULAIR) 10 MG tablet TAKE 1 TABLET BY MOUTH EVERY DAY (Patient taking differently: Take 10 mg by mouth daily.) 90 tablet 1   nitroGLYCERIN (NITROSTAT) 0.4 MG SL tablet PLACE 1 TABLET UNDER THE TONGUE EVERY 5 MINUTES X 3 DOSES AS NEEDED FOR CHEST PAIN *MAX 3 DOSES* 25 tablet 7   pregabalin (LYRICA) 200 MG capsule TAKE 1 CAPSULE (200 MG TOTAL) BY MOUTH 3 (THREE) TIMES DAILY. 270 capsule 1   rosuvastatin (CRESTOR) 5 MG tablet Take 1 tablet (5 mg total) by mouth daily. 90 tablet 3   sildenafil (VIAGRA) 100 MG tablet Take  0.5 tablets (50 mg total) by mouth as needed for erectile dysfunction (30 mintues prior to sexual intercourse). 10 tablet 3   tamsulosin (FLOMAX) 0.4 MG CAPS capsule Take 1 capsule (0.4 mg total) by mouth daily. 30 capsule 0   zolpidem (AMBIEN) 10 MG tablet Take 10 mg by mouth at bedtime as needed for sleep.     No current facility-administered medications for this visit.   Facility-Administered Medications Ordered in Other Visits  Medication Dose Route Frequency Provider Last Rate Last Admin   0.9 %  sodium chloride infusion   Intravenous Continuous Alvy Bimler, Beck Cofer, MD 50 mL/hr at 03/07/14 1005 New Bag at 03/07/14 1005   sodium chloride 0.9 % injection 10 mL  10 mL Intracatheter PRN Marcy Panning, MD   10 mL at 08/22/12 1721    PHYSICAL EXAMINATION: ECOG PERFORMANCE STATUS: 1 - Symptomatic but completely ambulatory  Vitals:   08/21/20 0857  BP: (!) 98/53  Pulse: 65  Resp: 18  Temp: 98.1 F (36.7 C)  SpO2: 100%   Filed Weights   08/21/20 0857  Weight: 165 lb 6.4 oz (75 kg)    GENERAL:alert, no distress and comfortable SKIN: He has significant skin  bruises throughout EYES: normal, Conjunctiva are pink and non-injected, sclera clear OROPHARYNX:no exudate, no erythema and lips, buccal mucosa, and tongue normal  NECK: supple, thyroid normal size, non-tender, without nodularity LYMPH: He has palpable lymphadenopathy in his neck LUNGS: clear to auscultation and percussion with normal breathing effort HEART: regular rate & rhythm and no murmurs and no lower extremity edema ABDOMEN:abdomen soft, non-tender and normal bowel sounds Musculoskeletal:no cyanosis of digits and no clubbing  NEURO: alert & oriented x 3 with fluent speech, no focal motor/sensory deficits  LABORATORY DATA:  I have reviewed the data as listed    Component Value Date/Time   NA 142 08/21/2020 0822   NA 142 06/19/2019 0848   NA 136 01/26/2017 0940   K 4.2 08/21/2020 0822   K 4.0 01/26/2017 0940   CL 110 08/21/2020 0822   CL 104 08/03/2012 1229   CO2 25 08/21/2020 0822   CO2 23 01/26/2017 0940   GLUCOSE 107 (H) 08/21/2020 0822   GLUCOSE 146 (H) 01/26/2017 0940   GLUCOSE 223 (H) 08/03/2012 1229   BUN 13 08/21/2020 0822   BUN 13 06/19/2019 0848   BUN 9.6 01/26/2017 0940   CREATININE 0.69 08/21/2020 0822   CREATININE 1.04 04/24/2018 0821   CREATININE 1.0 01/26/2017 0940   CALCIUM 8.6 (L) 08/21/2020 0822   CALCIUM 8.7 01/26/2017 0940   PROT 5.9 (L) 08/21/2020 0822   PROT 5.9 (L) 04/13/2017 0823   PROT 5.7 (L) 01/26/2017 0940   ALBUMIN 3.4 (L) 08/21/2020 0822   ALBUMIN 3.6 01/26/2017 0940   AST 25 08/21/2020 0822   AST 13 (L) 04/24/2018 0821   AST 13 01/26/2017 0940   ALT 25 08/21/2020 0822   ALT 10 04/24/2018 0821   ALT 12 01/26/2017 0940   ALKPHOS 110 08/21/2020 0822   ALKPHOS 78 01/26/2017 0940   BILITOT 0.4 08/21/2020 0822   BILITOT 0.7 04/24/2018 0821   BILITOT 0.64 01/26/2017 0940   GFRNONAA >60 08/21/2020 0822   GFRNONAA >60 04/24/2018 0821   GFRAA >60 10/25/2019 0819   GFRAA >60 04/24/2018 0821    No results found for: SPEP, UPEP  Lab  Results  Component Value Date   WBC 5.2 08/21/2020   NEUTROABS 3.0 08/21/2020   HGB 11.3 (L) 08/21/2020   HCT 35.1 (L)  08/21/2020   MCV 90.5 08/21/2020   PLT 77 (L) 08/21/2020      Chemistry      Component Value Date/Time   NA 142 08/21/2020 0822   NA 142 06/19/2019 0848   NA 136 01/26/2017 0940   K 4.2 08/21/2020 0822   K 4.0 01/26/2017 0940   CL 110 08/21/2020 0822   CL 104 08/03/2012 1229   CO2 25 08/21/2020 0822   CO2 23 01/26/2017 0940   BUN 13 08/21/2020 0822   BUN 13 06/19/2019 0848   BUN 9.6 01/26/2017 0940   CREATININE 0.69 08/21/2020 0822   CREATININE 1.04 04/24/2018 0821   CREATININE 1.0 01/26/2017 0940      Component Value Date/Time   CALCIUM 8.6 (L) 08/21/2020 0822   CALCIUM 8.7 01/26/2017 0940   ALKPHOS 110 08/21/2020 0822   ALKPHOS 78 01/26/2017 0940   AST 25 08/21/2020 0822   AST 13 (L) 04/24/2018 0821   AST 13 01/26/2017 0940   ALT 25 08/21/2020 0822   ALT 10 04/24/2018 0821   ALT 12 01/26/2017 0940   BILITOT 0.4 08/21/2020 0822   BILITOT 0.7 04/24/2018 0821   BILITOT 0.64 01/26/2017 0940

## 2020-08-26 ENCOUNTER — Other Ambulatory Visit (HOSPITAL_COMMUNITY): Payer: Self-pay

## 2020-08-26 MED FILL — Acalabrutinib Cap 100 MG: ORAL | 30 days supply | Qty: 60 | Fill #3 | Status: AC

## 2020-09-01 ENCOUNTER — Other Ambulatory Visit: Payer: Self-pay | Admitting: Hematology and Oncology

## 2020-09-01 ENCOUNTER — Other Ambulatory Visit (HOSPITAL_COMMUNITY): Payer: Self-pay

## 2020-09-08 ENCOUNTER — Other Ambulatory Visit: Payer: Self-pay | Admitting: Hematology and Oncology

## 2020-09-08 DIAGNOSIS — C9110 Chronic lymphocytic leukemia of B-cell type not having achieved remission: Secondary | ICD-10-CM

## 2020-09-15 ENCOUNTER — Encounter: Payer: Self-pay | Admitting: Hematology and Oncology

## 2020-09-15 ENCOUNTER — Other Ambulatory Visit: Payer: Self-pay | Admitting: Hematology and Oncology

## 2020-09-15 MED ORDER — LIDOCAINE-PRILOCAINE 2.5-2.5 % EX CREA: 1.0000 "application " | TOPICAL_CREAM | CUTANEOUS | 9 refills | Status: AC | PRN

## 2020-09-16 ENCOUNTER — Inpatient Hospital Stay: Payer: Medicare Other | Attending: Hematology and Oncology

## 2020-09-16 ENCOUNTER — Other Ambulatory Visit: Payer: Self-pay

## 2020-09-16 ENCOUNTER — Ambulatory Visit (HOSPITAL_COMMUNITY)
Admission: RE | Admit: 2020-09-16 | Discharge: 2020-09-16 | Disposition: A | Discharge status: Home / Self Care | Payer: Medicare Other | Source: Ambulatory Visit | Attending: Hematology and Oncology | Admitting: Hematology and Oncology

## 2020-09-16 ENCOUNTER — Encounter (HOSPITAL_COMMUNITY): Payer: Self-pay

## 2020-09-16 ENCOUNTER — Telehealth: Payer: Self-pay

## 2020-09-16 DIAGNOSIS — C3492 Malignant neoplasm of unspecified part of left bronchus or lung: Secondary | ICD-10-CM | POA: Insufficient documentation

## 2020-09-16 DIAGNOSIS — C8308 Small cell B-cell lymphoma, lymph nodes of multiple sites: Secondary | ICD-10-CM

## 2020-09-16 DIAGNOSIS — C8518 Unspecified B-cell lymphoma, lymph nodes of multiple sites: Secondary | ICD-10-CM | POA: Diagnosis not present

## 2020-09-16 DIAGNOSIS — D801 Nonfamilial hypogammaglobulinemia: Secondary | ICD-10-CM | POA: Insufficient documentation

## 2020-09-16 DIAGNOSIS — R591 Generalized enlarged lymph nodes: Secondary | ICD-10-CM

## 2020-09-16 DIAGNOSIS — Z79899 Other long term (current) drug therapy: Secondary | ICD-10-CM | POA: Insufficient documentation

## 2020-09-16 DIAGNOSIS — Z95828 Presence of other vascular implants and grafts: Secondary | ICD-10-CM

## 2020-09-16 LAB — COMPREHENSIVE METABOLIC PANEL
ALT: 27 U/L (ref 0–44)
AST: 22 U/L (ref 15–41)
Albumin: 3.5 g/dL (ref 3.5–5.0)
Alkaline Phosphatase: 105 U/L (ref 38–126)
Anion gap: 8 (ref 5–15)
BUN: 9 mg/dL (ref 8–23)
CO2: 25 mmol/L (ref 22–32)
Calcium: 8.7 mg/dL — ABNORMAL LOW (ref 8.9–10.3)
Chloride: 109 mmol/L (ref 98–111)
Creatinine, Ser: 0.77 mg/dL (ref 0.61–1.24)
GFR, Estimated: >60 mL/min (ref >60)
Glucose, Bld: 100 mg/dL — ABNORMAL HIGH (ref 70–99)
Potassium: 4.2 mmol/L (ref 3.5–5.1)
Sodium: 142 mmol/L (ref 135–145)
Total Bilirubin: 0.4 mg/dL (ref 0.3–1.2)
Total Protein: 5.9 g/dL — ABNORMAL LOW (ref 6.5–8.1)

## 2020-09-16 LAB — CBC WITH DIFFERENTIAL/PLATELET
Abs Immature Granulocytes: 0.01 10*3/uL (ref 0.00–0.07)
Basophils Absolute: 0 10*3/uL (ref 0.0–0.1)
Basophils Relative: 1 %
Eosinophils Absolute: 0 10*3/uL (ref 0.0–0.5)
Eosinophils Relative: 1 %
HCT: 35.7 % — ABNORMAL LOW (ref 39.0–52.0)
Hemoglobin: 11.6 g/dL — ABNORMAL LOW (ref 13.0–17.0)
Immature Granulocytes: 0 %
Lymphocytes Relative: 33 %
Lymphs Abs: 1.4 10*3/uL (ref 0.7–4.0)
MCH: 29.2 pg (ref 26.0–34.0)
MCHC: 32.5 g/dL (ref 30.0–36.0)
MCV: 89.9 fL (ref 80.0–100.0)
Monocytes Absolute: 0.4 10*3/uL (ref 0.1–1.0)
Monocytes Relative: 10 %
Neutro Abs: 2.3 10*3/uL (ref 1.7–7.7)
Neutrophils Relative %: 55 %
Platelets: 81 10*3/uL — ABNORMAL LOW (ref 150–400)
RBC: 3.97 MIL/uL — ABNORMAL LOW (ref 4.22–5.81)
RDW: 17 % — ABNORMAL HIGH (ref 11.5–15.5)
WBC: 4.1 10*3/uL (ref 4.0–10.5)
nRBC: 0 % (ref 0.0–0.2)

## 2020-09-16 MED ORDER — SODIUM CHLORIDE 0.9% FLUSH
10.0000 mL | Freq: Once | INTRAVENOUS | Status: AC
  Administered 2020-09-16: 10 mL
  Filled 2020-09-16: qty 10

## 2020-09-16 MED ORDER — HEPARIN SOD (PORK) LOCK FLUSH 100 UNIT/ML IV SOLN
INTRAVENOUS | Status: AC
  Filled 2020-09-16: qty 5

## 2020-09-16 MED ORDER — IOHEXOL 350 MG/ML SOLN
100.0000 mL | Freq: Once | INTRAVENOUS | Status: AC | PRN
  Administered 2020-09-16: 80 mL via INTRAVENOUS

## 2020-09-16 MED ORDER — HEPARIN SOD (PORK) LOCK FLUSH 100 UNIT/ML IV SOLN
500.0000 [IU] | Freq: Once | INTRAVENOUS | Status: AC
  Administered 2020-09-16: 500 [IU] via INTRAVENOUS

## 2020-09-16 NOTE — Telephone Encounter (Signed)
Notified Ryan Copeland of prior authorization approval for Lidocaine-Prilocaine 2.5% Cream. Medication is authorized through 12/15/2020.

## 2020-09-18 ENCOUNTER — Encounter: Payer: Self-pay | Admitting: Hematology and Oncology

## 2020-09-18 ENCOUNTER — Inpatient Hospital Stay (HOSPITAL_BASED_OUTPATIENT_CLINIC_OR_DEPARTMENT_OTHER): Payer: Medicare Other | Admitting: Hematology and Oncology

## 2020-09-18 ENCOUNTER — Other Ambulatory Visit: Payer: Self-pay

## 2020-09-18 ENCOUNTER — Inpatient Hospital Stay: Payer: Medicare Other

## 2020-09-18 ENCOUNTER — Telehealth: Payer: Self-pay | Admitting: Internal Medicine

## 2020-09-18 VITALS — BP 119/55 | HR 67 | Temp 97.7°F | Resp 18 | Ht 72.0 in | Wt 168.2 lb

## 2020-09-18 VITALS — BP 117/52 | HR 62 | Temp 97.5°F | Resp 17

## 2020-09-18 DIAGNOSIS — C3490 Malignant neoplasm of unspecified part of unspecified bronchus or lung: Secondary | ICD-10-CM

## 2020-09-18 DIAGNOSIS — C3492 Malignant neoplasm of unspecified part of left bronchus or lung: Secondary | ICD-10-CM

## 2020-09-18 DIAGNOSIS — D696 Thrombocytopenia, unspecified: Secondary | ICD-10-CM

## 2020-09-18 DIAGNOSIS — C8308 Small cell B-cell lymphoma, lymph nodes of multiple sites: Secondary | ICD-10-CM

## 2020-09-18 DIAGNOSIS — D801 Nonfamilial hypogammaglobulinemia: Secondary | ICD-10-CM

## 2020-09-18 DIAGNOSIS — Z95828 Presence of other vascular implants and grafts: Secondary | ICD-10-CM

## 2020-09-18 MED ORDER — METHYLPREDNISOLONE SODIUM SUCC 40 MG IJ SOLR
40.0000 mg | Freq: Once | INTRAMUSCULAR | Status: AC
  Administered 2020-09-18: 40 mg via INTRAVENOUS

## 2020-09-18 MED ORDER — IMMUNE GLOBULIN (HUMAN) 10 GM/100ML IJ SOLN
0.5000 g/kg | Freq: Once | INTRAMUSCULAR | Status: AC
  Administered 2020-09-18: 40 g via INTRAVENOUS
  Filled 2020-09-18: qty 400

## 2020-09-18 MED ORDER — LORAZEPAM 0.5 MG PO TABS: 0.5000 mg | ORAL_TABLET | Freq: Three times a day (TID) | ORAL | 0 refills | Status: DC

## 2020-09-18 MED ORDER — ACETAMINOPHEN 325 MG PO TABS
650.0000 mg | ORAL_TABLET | Freq: Once | ORAL | Status: AC
  Administered 2020-09-18: 650 mg via ORAL

## 2020-09-18 MED ORDER — DIPHENHYDRAMINE HCL 25 MG PO TABS
25.0000 mg | ORAL_TABLET | Freq: Once | ORAL | Status: AC
  Administered 2020-09-18: 25 mg via ORAL
  Filled 2020-09-18: qty 1

## 2020-09-18 MED ORDER — SODIUM CHLORIDE 0.9% FLUSH
10.0000 mL | Freq: Once | INTRAVENOUS | Status: AC
  Administered 2020-09-18: 10 mL
  Filled 2020-09-18: qty 10

## 2020-09-18 MED ORDER — ACETAMINOPHEN 325 MG PO TABS
ORAL_TABLET | ORAL | Status: AC
  Filled 2020-09-18: qty 2

## 2020-09-18 MED ORDER — DIPHENHYDRAMINE HCL 25 MG PO CAPS
ORAL_CAPSULE | ORAL | Status: AC
  Filled 2020-09-18: qty 1

## 2020-09-18 MED ORDER — METHYLPREDNISOLONE SODIUM SUCC 40 MG IJ SOLR
INTRAMUSCULAR | Status: AC
  Filled 2020-09-18: qty 1

## 2020-09-18 MED ORDER — HEPARIN SOD (PORK) LOCK FLUSH 100 UNIT/ML IV SOLN
500.0000 [IU] | Freq: Once | INTRAVENOUS | Status: AC
  Administered 2020-09-18: 500 [IU]
  Filled 2020-09-18: qty 5

## 2020-09-18 NOTE — Telephone Encounter (Signed)
Will route message to provider to see if additional scan is needed.   ND please advise if another CT is needed. Thanks :)

## 2020-09-18 NOTE — Patient Instructions (Signed)
Immune Globulin Injection What is this medication? IMMUNE GLOBULIN (im MUNE GLOB yoo lin) helps to prevent or reduce the severity of certain infections in patients who are at risk. This medicine is collected from the pooled blood of many donors. It is used to treat immune systemproblems, thrombocytopenia, and Kawasaki syndrome. This medicine may be used for other purposes; ask your health care provider orpharmacist if you have questions. COMMON BRAND NAME(S): ASCENIV, Baygam, BIVIGAM, Carimune, Carimune NF, cutaquig, Cuvitru, Flebogamma, Flebogamma DIF, GamaSTAN, GamaSTAN S/D, Gamimune N, Gammagard, Gammagard S/D, Gammaked, Gammaplex, Gammar-P IV, Gamunex, Gamunex-C, Hizentra, Iveegam, Iveegam EN, Octagam, Panglobulin, Panglobulin NF, panzyga, Polygam S/D, Privigen, Sandoglobulin, Venoglobulin-S, Vigam,Vivaglobulin, Xembify What should I tell my care team before I take this medication? They need to know if you have any of these conditions: diabetes extremely low or no immune antibodies in the blood heart disease history of blood clots hyperprolinemia infection in the blood, sepsis kidney disease recently received or scheduled to receive a vaccination an unusual or allergic reaction to human immune globulin, albumin, maltose, sucrose, other medicines, foods, dyes, or preservatives pregnant or trying to get pregnant breast-feeding How should I use this medication? This medicine is for injection into a muscle or infusion into a vein or skin. It is usually given by a health care professional in a hospital or clinicsetting. In rare cases, some brands of this medicine might be given at home. You will be taught how to give this medicine. Use exactly as directed. Take your medicineat regular intervals. Do not take your medicine more often than directed. Talk to your pediatrician regarding the use of this medicine in children. Whilethis drug may be prescribed for selected conditions, precautions do  apply. Overdosage: If you think you have taken too much of this medicine contact apoison control center or emergency room at once. NOTE: This medicine is only for you. Do not share this medicine with others. What if I miss a dose? It is important not to miss your dose. Call your doctor or health care professional if you are unable to keep an appointment. If you give yourself the medicine and you miss a dose, take it as soon as you can. If it is almost timefor your next dose, take only that dose. Do not take double or extra doses. What may interact with this medication? aspirin and aspirin-like medicines cisplatin cyclosporine medicines for infection like acyclovir, adefovir, amphotericin B, bacitracin, cidofovir, foscarnet, ganciclovir, gentamicin, pentamidine, vancomycin NSAIDS, medicines for pain and inflammation, like ibuprofen or naproxen pamidronate vaccines zoledronic acid This list may not describe all possible interactions. Give your health care provider a list of all the medicines, herbs, non-prescription drugs, or dietary supplements you use. Also tell them if you smoke, drink alcohol, or use illegaldrugs. Some items may interact with your medicine. What should I watch for while using this medication? Your condition will be monitored carefully while you are receiving thismedicine. This medicine is made from pooled blood donations of many different people. It may be possible to pass an infection in this medicine. However, the donors are screened for infections and all products are tested for HIV and hepatitis. The medicine is treated to kill most or all bacteria and viruses. Talk to yourdoctor about the risks and benefits of this medicine. Do not have vaccinations for at least 14 days before, or until at least 3months after receiving this medicine. What side effects may I notice from receiving this medication? Side effects that you should report to your doctor   or health care  professionalas soon as possible: allergic reactions like skin rash, itching or hives, swelling of the face, lips, or tongue blue colored lips or skin breathing problems chest pain or tightness fever signs and symptoms of aseptic meningitis such as stiff neck; sensitivity to light; headache; drowsiness; fever; nausea; vomiting; rash signs and symptoms of a blood clot such as chest pain; shortness of breath; pain, swelling, or warmth in the leg signs and symptoms of hemolytic anemia such as fast heartbeat; tiredness; dark yellow or brown urine; or yellowing of the eyes or skin signs and symptoms of kidney injury like trouble passing urine or change in the amount of urine sudden weight gain swelling of the ankles, feet, hands Side effects that usually do not require medical attention (report to yourdoctor or health care professional if they continue or are bothersome): diarrhea flushing headache increased sweating joint pain muscle cramps muscle pain nausea pain, redness, or irritation at site where injected tiredness This list may not describe all possible side effects. Call your doctor for medical advice about side effects. You may report side effects to FDA at1-800-FDA-1088. Where should I keep my medication? Keep out of the reach of children. This drug is usually given in a hospital or clinic and will not be stored athome. In rare cases, some brands of this medicine may be given at home. If you are using this medicine at home, you will be instructed on how to store thismedicine. Throw away any unused medicine after the expiration date on the label. NOTE: This sheet is a summary. It may not cover all possible information. If you have questions about this medicine, talk to your doctor, pharmacist, orhealth care provider.  2022 Elsevier/Gold Standard (2018-09-05 12:51:14)  

## 2020-09-18 NOTE — Assessment & Plan Note (Signed)
I have reviewed multiple CT imaging with the patient Unfortunately, the findings is most consistent with lung cancer I do not believe this is resectable I will order a staging MRI, PET CT scan and referral back to his pulmonologist for bronchoscopy and biopsy I will see him back once we have test results

## 2020-09-20 ENCOUNTER — Encounter: Payer: Self-pay | Admitting: Hematology and Oncology

## 2020-09-20 NOTE — Assessment & Plan Note (Signed)
He has multifactorial pancytopenia He is known to have splenomegaly 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 He has mild anemia chronic illness Observe closely for now

## 2020-09-20 NOTE — Assessment & Plan Note (Signed)
He will continue Calquence He is advised to stop Calquence at least 3 days before schedule bronchoscopy

## 2020-09-20 NOTE — Progress Notes (Signed)
[facility] OFFICE PROGRESS NOTE  Patient Care Team: [name]., PA-C as PCP - General (Family Medicine) Burtis Junes, NP (Inactive) as Nurse Practitioner (Cardiology) Carol Ada, MD as Consulting Physician (Gastroenterology)  ASSESSMENT & PLAN:  Non-small cell carcinoma of lung, left [facility]) I have reviewed multiple CT imaging with the patient Unfortunately, the findings is most consistent with lung cancer I do not believe this is resectable I will order a staging MRI, PET CT scan and referral back to his pulmonologist for bronchoscopy and biopsy I will see him back once we have test results  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 [facility]) He will continue Calquence He is advised to stop Calquence at least 3 days before schedule bronchoscopy   Hypogammaglobulinemia, acquired He will continue IVIG as prescribed   Thrombocytopenia ([place]) He has multifactorial pancytopenia He is known to have 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    Answer:   Anti-anxiety    Order Specific Question:   Does the patient have a pacemaker or implanted devices?    Answer:   No    Order Specific Question:   Use SRS Protocol?    Answer:   No    Order Specific Question:   Preferred imaging location?    Answer:   [facility] (table limit - 550 lbs)   NM PET Image Restag (PS) Skull Base To Thigh    Standing Status:   Future    Standing Expiration Date:   09/18/2021    Order Specific Question:   If  indicated for the ordered procedure, I authorize the administration of a radiopharmaceutical per Radiology protocol    Answer:   Yes    Order Specific Question:   Preferred imaging location?    Answer:   [facility]    All questions were answered. The patient knows to call the clinic with any problems, questions or concerns. The total time spent in the appointment was 40 minutes encounter with patients including review of chart and various tests results, discussions about plan of care and coordination of care plan   [name], MD 09/20/2020 12:59 PM  INTERVAL HISTORY: Please see below for problem oriented charting. 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  No evidence of aortic dissection or other acute process in the chest. 2.  Centrilobular emphysema with a 5 mm right lung nodule. Given the concurrent centrilobular emphysema, follow-up chest CT at 6 -12 months is recommended.  3.  Coronary artery atherosclerosis which is age advanced. 4.  Prominent thoracic lymph nodes.  These can be reevaluated at follow-up.    05/06/2010 Imaging   1.  Multiple small periaortic lymph nodes consistent with the patient's history of the chronic lymphocytic leukemia. 2.  No evidence of solid organ involvement    11/03/2011 Imaging   1.  Interval progression of abdominal and pelvic adenopathy. 2.  Progression of splenomegaly.  The spleen now measures 23 cm in length    11/18/2011 Bone Marrow Biopsy   Bone Marrow, Aspirate,Biopsy, and Clot, right iliac bone - HYPERCELLULAR BONE MARROW WITH EXTENSIVE INVOLVEMENT BY CHRONIC LYMPHOCYTIC LEUKEMIA. PERIPHERAL BLOOD: - CHRONIC LYMPHOCYTIC LEUKEMIA    03/08/2012  Imaging   1.  Progressive increase in retroperitoneal, iliac, and inguinal lymphadenopathy. 2.  Interval increase in massive splenomegaly.      06/01/2012 Procedure   Placement of single lumen port a cath via right internal jugular vein.  The catheter tip lies at the cavoatrial junction.  A power injectable port a cath was placed and is ready for immediate use    06/20/2012 - 11/23/2012 Chemotherapy   He received FCR x 6 cycles    12/19/2012 Imaging   Left common iliac stent. Abdominal vasculature remains patent. Improving supraclavicular and axillary lymphadenopathy. Residual right subpectoral nodes measure up to 10 mm short axis. Improving retroperitoneal lymphadenopathy, measuring up to 16 mm short axis. Improving splenomegaly, measuring 18.7 cm.      01/20/2016 Imaging   1. Stable exam.  No new or progressive findings. 2. No CT findings to explain odynophagia    09/02/2016 Pathology Results   The findings are consistent with involvement by previously known chronic lymphocytic leukemia    09/02/2016 Pathology Results   FISH for CLL came back positive for deletion 13q    09/15/2016 Imaging   1. Borderline enlarged abdominal and pelvic lymph nodes. Compared with 11/03/2011 these are decreased in size as detailed above. 2. Persistent splenomegaly. 3. Aortic Atherosclerosis (ICD10-I70.0). LAD coronary artery calcification noted.    10/06/2016 - 02/24/2017 Chemotherapy   He received Bendamustine and Rituxan    11/03/2016 Adverse Reaction   Dose of Bendamustine is reduced due to severe pancytopenia    12/28/2016 Imaging   1. Borderline enlarged abdominal peritoneal ligament and abdominal retroperitoneal lymph nodes, stable. 2. Splenomegaly. 3.  Aortic atherosclerosis (ICD10-170.0).    03/22/2017 PET scan   1. No hypermetabolic adenopathy identified within the neck, chest, abdomen or pelvis. 2. Prominent left retroperitoneal node measures 1.6 cm without significant FDG uptake. 3.  Splenomegaly. 4. Aortic Atherosclerosis (ICD10-I70.0) and Emphysema (ICD10-J43.9). LAD and left circumflex atherosclerotic calcifications noted.    02/02/2018 Procedure   IMPRESSION: Widely patent right IJ power port catheter.    04/06/2018 Imaging   1. Interval enlargement of axillary, mediastinal, and retroperitoneal lymph nodes, as well as increased splenomegaly, findings concerning for progression of CLL in comparison to prior PET-CT dated 03/22/2017.   2.  Other chronic and incidental findings as detailed above.      04/16/2018 -  Chemotherapy   The patient had acalabrutinib for chemo    04/17/2018 - 04/21/2018 Hospital Admission   He was hospitalized for influenza    11/29/2018 Imaging   1. Interval response to therapy. No thoracic added not scratch set no thoracic or pelvic adenopathy identified. Significant improvement in abdominal adenopathy. Largest remaining lymph node measures 1.3cm in the left retroperitoneal region. Previously 2.5 cm. 2. Persistent splenomegaly, improved from previous exam. 3. No new or progressive disease identified within the chest, abdomen or pelvis. 4. Stable appearance of 5 mm right middle lobe lung nodule. 5. Aortic Atherosclerosis (ICD10-I70.0) and Emphysema (ICD10-J43.9). Coronary  artery calcifications.     09/17/2020 Imaging   CT neck  Negative for mass or adenopathy in the neck. No evidence of recurrent CLL   09/17/2020 Imaging   1. Interval enlargement of a perihilar nodule anteriorly in the left upper lobe, measuring 2.1 x 1.2 cm, previously 1.4 x 0.9 cm when measured similarly. This is highly concerning for a slowly enlarging primary lung malignancy. Recommend multidisciplinary thoracic referral for consideration of metabolic characterization by PET-CT and tissue sampling. 2. There is a new nodule of the anterior left upper lobe measuring 1.0 cm, nonspecific although worrisome for satellite nodule or metastasis. 3. Unchanged enlargement of a  left hilar lymph node, concerning for nodal metastasis. 4. Unchanged consolidation and nodularity of the infrahilar left lower lobe as well as of the perihilar right middle lobe, these findings generally nonspecific and infectious or inflammatory, appearance particularly suggesting atypical mycobacterial infection.  5. Redemonstrated enlarged right axillary and retroperitoneal lymph nodes, stable to slightly diminished in size. These are in keeping with reported diagnosis of CLL. 6. Slightly improved splenomegaly, maximum coronal span 18.0 cm.  7. Emphysema. 8. Coronary artery disease.   Aortic Atherosclerosis (ICD10-I70.0) and Emphysema (ICD10-J43.9).   Non-small cell carcinoma of lung, left ([place])  09/18/2020 Initial Diagnosis   Non-small cell carcinoma of lung, left ([place])    09/20/2020 Cancer Staging   Staging form: Lung, AJCC 8th Edition - Clinical stage from 09/20/2020: Stage IVA (cT3, cN1, cM1a) - Signed by [name], MD on 09/20/2020  Stage prefix: Initial diagnosis      REVIEW OF SYSTEMS:   Constitutional: Denies fevers, chills or abnormal weight loss Eyes: Denies blurriness of vision Ears, nose, mouth, throat, and face: Denies mucositis or sore throat Respiratory: Denies cough, dyspnea or wheezes Cardiovascular: Denies palpitation, chest discomfort or lower extremity swelling Gastrointestinal:  Denies nausea, heartburn or change in bowel habits Skin: Denies abnormal skin rashes Lymphatics: Denies new lymphadenopathy or easy bruising Neurological:Denies numbness, tingling or new weaknesses Behavioral/Psych: Mood is stable, no new changes  All other systems were reviewed with the patient and are negative.  I have reviewed the past medical history, past surgical history, social history and family history with the patient and they are unchanged from previous note.  ALLERGIES:  is allergic to immune globulin and codeine.  MEDICATIONS:  Current Outpatient Medications  Medication  Sig Dispense Refill   LORazepam (ATIVAN) 0.5 MG tablet Take 1 tablet (0.5 mg total) by mouth every 8 (eight) hours. Take 1 tablet 45 mins before MRI, may repeat X 1 if needed 3 tablet 0   acalabrutinib (CALQUENCE) 100 MG capsule TAKE 1 CAPSULE BY MOUTH TWICE A DAY (Patient taking differently: Take 100 mg by mouth 2 (two) times daily.) 60 capsule 11   acetaminophen (TYLENOL) 325 MG tablet Take 2 tablets (650 mg total) by mouth every 6 (six) hours as needed for moderate pain or fever. 30 tablet 0   acyclovir (ZOVIRAX) 400 MG tablet TAKE 1 TABLET BY MOUTH TWICE A DAY (Patient taking differently: Take 400 mg by mouth 2 (two) times daily.) 180 tablet 1   allopurinol (ZYLOPRIM) 300 MG tablet Take 1 tablet (300 mg total) by mouth daily. 90 tablet 1   ALPRAZolam (XANAX) 0.5 MG tablet Take 0.5 mg by mouth 2 (two) times daily as needed for anxiety.     aspirin EC 81 MG tablet Take 81 mg by mouth daily. Swallow whole.     b complex vitamins tablet Take 1 tablet by mouth daily.  benzonatate (TESSALON) 100 MG capsule TAKE TWO CAPSULES BY MOUTH THREE TIMES A DAY AS NEEDED (Patient taking differently: Take 200 mg by mouth 3 (three) times daily as needed for cough.) 30 capsule 2   Cholecalciferol 25 MCG (1000 UT) tablet Take 1,000 Units by mouth daily.      diphenoxylate-atropine (LOMOTIL) 2.5-0.025 MG tablet TAKE 1 TABLET BY MOUTH 4 (FOUR) TIMES DAILY AS NEEDED FOR DIARRHEA OR LOOSE STOOLS. 60 tablet 1   famotidine (PEPCID) 20 MG tablet TAKE 1 TABLET BY MOUTH TWICE A DAY (Patient taking differently: Take 20 mg by mouth 2 (two) times daily.) 180 tablet 1   HYDROcodone-acetaminophen (NORCO) 10-325 MG tablet Take 1 tablet by mouth every 6 (six) hours as needed for moderate pain.     lidocaine-prilocaine (EMLA) cream Apply 1 application topically as needed (prior chemo/IBIG). 30 g 9   loperamide (IMODIUM) 2 MG capsule Take 2 capsules (4 mg total) by mouth 4 (four) times daily. 120 capsule 9   meclizine (ANTIVERT)  12.5 MG tablet Take 12.5 mg by mouth 3 (three) times daily as needed for dizziness.     metFORMIN (GLUCOPHAGE-XR) 500 MG 24 hr tablet Take 500 mg by mouth every evening.     midodrine (PROAMATINE) 5 MG tablet Take 1.5 tablets (7.5 mg total) by mouth 3 (three) times daily with meals. 135 tablet 1   montelukast (SINGULAIR) 10 MG tablet TAKE 1 TABLET BY MOUTH EVERY DAY (Patient taking differently: Take 10 mg by mouth daily.) 90 tablet 1   nitroGLYCERIN (NITROSTAT) 0.4 MG SL tablet PLACE 1 TABLET UNDER THE TONGUE EVERY 5 MINUTES X 3 DOSES AS NEEDED FOR CHEST PAIN *MAX 3 DOSES* 25 tablet 7   pregabalin (LYRICA) 200 MG capsule TAKE 1 CAPSULE (200 MG TOTAL) BY MOUTH 3 (THREE) TIMES DAILY. 270 capsule 1   rosuvastatin (CRESTOR) 5 MG tablet Take 1 tablet (5 mg total) by mouth daily. 90 tablet 3   sildenafil (VIAGRA) 100 MG tablet Take 0.5 tablets (50 mg total) by mouth as needed for erectile dysfunction (30 mintues prior to sexual intercourse). 10 tablet 3   tamsulosin (FLOMAX) 0.4 MG CAPS capsule Take 1 capsule (0.4 mg total) by mouth daily. 30 capsule 0   zolpidem (AMBIEN) 10 MG tablet Take 10 mg by mouth at bedtime as needed for sleep.     No current facility-administered medications for this visit.   Facility-Administered Medications Ordered in Other Visits  Medication Dose Route Frequency Provider Last Rate Last Admin   0.9 %  sodium chloride infusion   Intravenous Continuous Alvy Bimler, Neville Walston, MD 50 mL/hr at 03/07/14 1005 New Bag at 03/07/14 1005   sodium chloride 0.9 % injection 10 mL  10 mL Intracatheter PRN Marcy Panning, MD   10 mL at 08/22/12 1721    PHYSICAL EXAMINATION: ECOG PERFORMANCE STATUS: 1 - Symptomatic but completely ambulatory  Vitals:   09/18/20 0915  BP: (!) 119/55  Pulse: 67  Resp: 18  Temp: 97.7 F (36.5 C)  SpO2: 100%   Filed Weights   09/18/20 0915  Weight: 168 lb 3.2 oz (76.3 kg)    GENERAL:alert, no distress and comfortable  NEURO: alert & oriented x 3 with fluent  speech, no focal motor/sensory deficits  LABORATORY DATA:  I have reviewed the data as listed    Component Value Date/Time   NA 142 09/16/2020 1150   NA 142 06/19/2019 0848   NA 136 01/26/2017 0940   K 4.2 09/16/2020 1150   K 4.0  01/26/2017 0940   CL 109 09/16/2020 1150   CL 104 08/03/2012 1229   CO2 25 09/16/2020 1150   CO2 23 01/26/2017 0940   GLUCOSE 100 (H) 09/16/2020 1150   GLUCOSE 146 (H) 01/26/2017 0940   GLUCOSE 223 (H) 08/03/2012 1229   BUN 9 09/16/2020 1150   BUN 13 06/19/2019 0848   BUN 9.6 01/26/2017 0940   CREATININE 0.77 09/16/2020 1150   CREATININE 1.04 04/24/2018 0821   CREATININE 1.0 01/26/2017 0940   CALCIUM 8.7 (L) 09/16/2020 1150   CALCIUM 8.7 01/26/2017 0940   PROT 5.9 (L) 09/16/2020 1150   PROT 5.9 (L) 04/13/2017 0823   PROT 5.7 (L) 01/26/2017 0940   ALBUMIN 3.5 09/16/2020 1150   ALBUMIN 3.6 01/26/2017 0940   AST 22 09/16/2020 1150   AST 13 (L) 04/24/2018 0821   AST 13 01/26/2017 0940   ALT 27 09/16/2020 1150   ALT 10 04/24/2018 0821   ALT 12 01/26/2017 0940   ALKPHOS 105 09/16/2020 1150   ALKPHOS 78 01/26/2017 0940   BILITOT 0.4 09/16/2020 1150   BILITOT 0.7 04/24/2018 0821   BILITOT 0.64 01/26/2017 0940   GFRNONAA >60 09/16/2020 1150   GFRNONAA >60 04/24/2018 0821   GFRAA >60 10/25/2019 0819   GFRAA >60 04/24/2018 0821    No results found for: SPEP, UPEP  Lab Results  Component Value Date   WBC 4.1 09/16/2020   NEUTROABS 2.3 09/16/2020   HGB 11.6 (L) 09/16/2020   HCT 35.7 (L) 09/16/2020   MCV 89.9 09/16/2020   PLT 81 (L) 09/16/2020      Chemistry      Component Value Date/Time   NA 142 09/16/2020 1150   NA 142 06/19/2019 0848   NA 136 01/26/2017 0940   K 4.2 09/16/2020 1150   K 4.0 01/26/2017 0940   CL 109 09/16/2020 1150   CL 104 08/03/2012 1229   CO2 25 09/16/2020 1150   CO2 23 01/26/2017 0940   BUN 9 09/16/2020 1150   BUN 13 06/19/2019 0848   BUN 9.6 01/26/2017 0940   CREATININE 0.77 09/16/2020 1150   CREATININE  1.04 04/24/2018 0821   CREATININE 1.0 01/26/2017 0940      Component Value Date/Time   CALCIUM 8.7 (L) 09/16/2020 1150   CALCIUM 8.7 01/26/2017 0940   ALKPHOS 105 09/16/2020 1150   ALKPHOS 78 01/26/2017 0940   AST 22 09/16/2020 1150   AST 13 (L) 04/24/2018 0821   AST 13 01/26/2017 0940   ALT 27 09/16/2020 1150   ALT 10 04/24/2018 0821   ALT 12 01/26/2017 0940   BILITOT 0.4 09/16/2020 1150   BILITOT 0.7 04/24/2018 0821   BILITOT 0.64 01/26/2017 0940       RADIOGRAPHIC STUDIES: I have reviewed imaging study with the patient I have personally reviewed the radiological images as listed and agreed with the findings in the report. CT Soft Tissue Neck W Contrast  Result Date: 09/17/2020 CLINICAL DATA:  CLL.  Weight loss. EXAM: CT NECK WITH CONTRAST TECHNIQUE: Multidetector CT imaging of the neck was performed using the standard protocol following the bolus administration of intravenous contrast. CONTRAST:  43m OMNIPAQUE IOHEXOL 350 MG/ML SOLN COMPARISON:  CT neck 03/08/2012.  PET CT 03/22/2017 FINDINGS: Pharynx and larynx: Normal. No mass or swelling. Salivary glands: Atrophic or surgically absent right submandibular gland unchanged. Left submandibular gland normal. Parotid normal bilaterally. Thyroid: Negative Lymph nodes: Negative for enlarged lymph node in the neck. Vascular: Normal vascular enhancement. Moderate atherosclerotic disease carotid  bifurcation bilaterally. Right jugular Port-A-Cath. Limited intracranial: Negative Visualized orbits: Negative Mastoids and visualized paranasal sinuses: Extensive mucosal edema left maxillary sinus. Mild mucosal edema right maxillary sinus. Remaining sinuses clear. Skeleton: Cervical spondylosis without acute skeletal abnormality. Upper chest: Lung apices clear bilaterally. Other: None IMPRESSION: Negative for mass or adenopathy in the neck. No evidence of recurrent CLL. Electronically Signed   By: Franchot Gallo M.D.   On: 09/17/2020 15:19   CT CHEST  ABDOMEN PELVIS W CONTRAST  Result Date: 09/17/2020 CLINICAL DATA:  CLL, ongoing oral therapy EXAM: CT CHEST, ABDOMEN, AND PELVIS WITH CONTRAST TECHNIQUE: Multidetector CT imaging of the chest, abdomen and pelvis was performed following the standard protocol during bolus administration of intravenous contrast. CONTRAST:  17m OMNIPAQUE IOHEXOL 350 MG/ML SOLN, additional oral enteric contrast COMPARISON:  CT chest, 03/31/2020, CT abdomen pelvis, 02/16/2020 FINDINGS: CT CHEST FINDINGS Cardiovascular: Right chest port catheter. Aortic atherosclerosis. Extensive 3 vessel coronary artery calcifications and stents. Mediastinum/Nodes: Unchanged prominent subcentimeter axillary and subpectoral lymph nodes, more conspicuous on the right (series 2, image 12). Unchanged enlargement of a left hilar lymph node measuring 1.9 x 1.6 cm (series 2, image 30). Thyroid gland, trachea, and esophagus demonstrate no significant findings. Lungs/Pleura: Mild centrilobular and paraseptal emphysema. Background of very fine centrilobular pulmonary nodules, most concentrated in the lung apices. Unchanged consolidation and nodularity of the infrahilar left lower lobe (series 4, image 103) as well as of the perihilar right middle lobe (series 4, image 95). Interval enlargement of a perihilar nodule anteriorly in the left upper lobe, measuring 2.1 x 1.2 cm, previously 1.4 x 0.9 cm when measured similarly (series 4, image 71). There is a new nodule of the anterior left upper lobe measuring 1.0 cm (series 4, image 76). No pleural effusion or pneumothorax. Musculoskeletal: No chest wall mass or suspicious bone lesions identified. CT ABDOMEN PELVIS FINDINGS Hepatobiliary: No focal liver abnormality is seen. Status post cholecystectomy. Mild postoperative biliary dilatation. Pancreas: Unremarkable. No pancreatic ductal dilatation or surrounding inflammatory changes. Spleen: Slightly improved splenomegaly, maximum coronal span 18.0 cm Adrenals/Urinary  Tract: Adrenal glands are unremarkable. Kidneys are normal, without renal calculi, solid lesion, or hydronephrosis. Suprapubic urinary catheter. Stomach/Bowel: Stomach is within normal limits. Appendix appears normal. No evidence of bowel wall thickening, distention, or inflammatory changes. Vascular/Lymphatic: Aortic atherosclerosis. Left common iliac artery stent. Redemonstrated enlarged retroperitoneal lymph nodes, largest left retroperitoneal nodes measuring 2.4 x 1.5 cm, stable or slightly diminished, previously 2.6 x 1.8 cm when measured similarly (series 2, image 71). Reproductive: Severe prostatomegaly. Other: No abdominal wall hernia or abnormality. No abdominopelvic ascites. Musculoskeletal: No acute or significant osseous findings. IMPRESSION: 1. Interval enlargement of a perihilar nodule anteriorly in the left upper lobe, measuring 2.1 x 1.2 cm, previously 1.4 x 0.9 cm when measured similarly. This is highly concerning for a slowly enlarging primary lung malignancy. Recommend multidisciplinary thoracic referral for consideration of metabolic characterization by PET-CT and tissue sampling. 2. There is a new nodule of the anterior left upper lobe measuring 1.0 cm, nonspecific although worrisome for satellite nodule or metastasis. 3. Unchanged enlargement of a left hilar lymph node, concerning for nodal metastasis. 4. Unchanged consolidation and nodularity of the infrahilar left lower lobe as well as of the perihilar right middle lobe, these findings generally nonspecific and infectious or inflammatory, appearance particularly suggesting atypical mycobacterial infection. 5. Redemonstrated enlarged right axillary and retroperitoneal lymph nodes, stable to slightly diminished in size. These are in keeping with reported diagnosis of CLL. 6. Slightly improved splenomegaly,  maximum coronal span 18.0 cm. 7. Emphysema. 8. Coronary artery disease. Aortic Atherosclerosis (ICD10-I70.0) and Emphysema (ICD10-J43.9).  Electronically Signed   By: Eddie Candle M.D.   On: 09/17/2020 15:23

## 2020-09-20 NOTE — Assessment & Plan Note (Signed)
He will continue IVIG as prescribed

## 2020-09-21 ENCOUNTER — Telehealth: Payer: Self-pay

## 2020-09-21 ENCOUNTER — Telehealth: Payer: Self-pay | Admitting: Internal Medicine

## 2020-09-21 NOTE — Telephone Encounter (Signed)
Call returned to [facility] at [place], [place].

## 2020-09-21 NOTE — Telephone Encounter (Signed)
Called and scheduled appt wit Dr. Alvy Bimler on 8/19 at 2:20 pm. He is aware of appt time/date.

## 2020-09-21 NOTE — Telephone Encounter (Signed)
Ryan Copeland from Dr. Alvy Bimler office at the cancer center stated that she is wanting Dr. Shearon Stalls to perform a bronchoscopy on the pt. Pls regard; [phone]; ask for Dr. Alvy Bimler nurse.

## 2020-09-21 NOTE — Telephone Encounter (Signed)
Spoke with Nurse Hassan Rowan.   Dr. Alvy Bimler cell # [phone].  Dr. Shearon Stalls please give Dr Alvy Bimler a call. Patient has lung cancer and is need of Bronch. Aware you return to clinic tomorrow 09/22/20. Also please see OV note from today from Dr Alvy Bimler. Thanks :)  If you would like [place] to go ahead and have [facility] team to work on scheduling Bronch please let us know. Thanks :)

## 2020-09-22 ENCOUNTER — Telehealth: Payer: Self-pay | Admitting: Pulmonary Disease

## 2020-09-22 DIAGNOSIS — R911 Solitary pulmonary nodule: Secondary | ICD-10-CM

## 2020-09-22 NOTE — Telephone Encounter (Signed)
Called and spoke with patient. He has been scheduled to see Dr. Shearon Stalls on 10/28/20 at 2pm. He verbalized understanding.   Nothing further needed at time of call.

## 2020-09-22 NOTE — Telephone Encounter (Signed)
-----   Message from Spero Geralds, MD sent at 09/22/2020  1:14 PM EDT ----- Regarding: nav Hey can you take a look at this perihilar mass? Something you want to nav?   LMK

## 2020-09-22 NOTE — Telephone Encounter (Signed)
Thanks, addresed in separate encounter

## 2020-09-22 NOTE — Telephone Encounter (Signed)
I called and spoke with [name]. He does not need have his CT scan done that I ordered. He does need his MRI and PET scan as ordered by Dr. Alvy Bimler. No call to him needed at this time.   I will have him scheduled for bronchoscopy in a separate encounter - He will likely need a navigational bronchoscopy based on location and Dr. Valeta Harms will review and schedule.   Triage - Can we please schedule him for follow up with me in September?

## 2020-09-22 NOTE — Telephone Encounter (Signed)
PCCM:  I reviewed the patient's CT imaging.  I called and spoke with the patient regarding bronchoscopy.  Patient is agreeable to proceed with navigational bronchoscopy.  Orders placed for case on 10/06/2020.  [name], DO [facility] 09/22/2020 7:31 PM

## 2020-09-23 ENCOUNTER — Telehealth: Payer: Self-pay | Admitting: Pulmonary Disease

## 2020-09-23 ENCOUNTER — Other Ambulatory Visit (HOSPITAL_COMMUNITY): Payer: Self-pay

## 2020-09-23 ENCOUNTER — Encounter: Payer: Self-pay | Admitting: Pulmonary Disease

## 2020-09-23 DIAGNOSIS — R911 Solitary pulmonary nodule: Secondary | ICD-10-CM | POA: Insufficient documentation

## 2020-09-23 MED FILL — Acalabrutinib Cap 100 MG: ORAL | 30 days supply | Qty: 60 | Fill #4 | Status: AC

## 2020-09-29 ENCOUNTER — Other Ambulatory Visit: Payer: Self-pay

## 2020-09-29 ENCOUNTER — Ambulatory Visit (HOSPITAL_COMMUNITY)
Admission: RE | Admit: 2020-09-29 | Discharge: 2020-09-29 | Disposition: A | Discharge status: Home / Self Care | Payer: Medicare Other | Source: Ambulatory Visit | Attending: Hematology and Oncology | Admitting: Hematology and Oncology

## 2020-09-29 ENCOUNTER — Other Ambulatory Visit (HOSPITAL_COMMUNITY): Payer: Self-pay

## 2020-09-29 DIAGNOSIS — C3490 Malignant neoplasm of unspecified part of unspecified bronchus or lung: Secondary | ICD-10-CM | POA: Diagnosis present

## 2020-09-29 MED ORDER — GADOBUTROL 1 MMOL/ML IV SOLN: 7.5000 mL | Freq: Once | INTRAVENOUS | Status: DC | PRN

## 2020-09-29 MED ORDER — GADOBUTROL 1 MMOL/ML IV SOLN
7.5000 mL | Freq: Once | INTRAVENOUS | Status: AC | PRN
  Administered 2020-09-29: 7.5 mL via INTRAVENOUS

## 2020-09-30 ENCOUNTER — Encounter: Payer: Self-pay | Admitting: Hematology and Oncology

## 2020-09-30 ENCOUNTER — Other Ambulatory Visit: Payer: Medicare Other

## 2020-10-01 ENCOUNTER — Telehealth: Payer: Self-pay

## 2020-10-01 NOTE — Telephone Encounter (Signed)
Called and rescheduled appts. He is aware of appt date/time.

## 2020-10-01 NOTE — Telephone Encounter (Signed)
-----   Message from [name], MD sent at 10/01/2020  9:30 AM EDT ----- Pls call him, I got his mychart message Might be helpful to wait until after bronchoscopy I can see him Friday 8/26 at 2 pm, 45 mins, please move his appt  Thanks

## 2020-10-02 ENCOUNTER — Other Ambulatory Visit: Payer: Self-pay

## 2020-10-02 ENCOUNTER — Ambulatory Visit (HOSPITAL_COMMUNITY)
Admission: RE | Admit: 2020-10-02 | Discharge: 2020-10-02 | Disposition: A | Discharge status: Home / Self Care | Payer: Medicare Other | Source: Ambulatory Visit | Attending: Hematology and Oncology | Admitting: Hematology and Oncology

## 2020-10-02 ENCOUNTER — Encounter (HOSPITAL_COMMUNITY): Payer: Self-pay | Admitting: Pulmonary Disease

## 2020-10-02 ENCOUNTER — Ambulatory Visit: Payer: Medicare Other | Admitting: Hematology and Oncology

## 2020-10-02 ENCOUNTER — Other Ambulatory Visit: Payer: Self-pay | Admitting: Pulmonary Disease

## 2020-10-02 DIAGNOSIS — C3490 Malignant neoplasm of unspecified part of unspecified bronchus or lung: Secondary | ICD-10-CM | POA: Diagnosis present

## 2020-10-02 DIAGNOSIS — C3492 Malignant neoplasm of unspecified part of left bronchus or lung: Secondary | ICD-10-CM | POA: Diagnosis present

## 2020-10-02 DIAGNOSIS — C8308 Small cell B-cell lymphoma, lymph nodes of multiple sites: Secondary | ICD-10-CM | POA: Diagnosis present

## 2020-10-02 DIAGNOSIS — Z79899 Other long term (current) drug therapy: Secondary | ICD-10-CM | POA: Diagnosis not present

## 2020-10-02 LAB — SARS CORONAVIRUS 2 (TAT 6-24 HRS): SARS Coronavirus 2: NEGATIVE

## 2020-10-02 LAB — GLUCOSE, CAPILLARY: Glucose-Capillary: 101 mg/dL — ABNORMAL HIGH (ref 70–99)

## 2020-10-02 MED ORDER — FLUDEOXYGLUCOSE F - 18 (FDG) INJECTION
9.0000 | Freq: Once | INTRAVENOUS | Status: AC | PRN
  Administered 2020-10-02: 8.4 via INTRAVENOUS

## 2020-10-02 NOTE — Progress Notes (Signed)
DUE TO COVID-19 ONLY ONE VISITOR IS ALLOWED TO COME WITH YOU AND STAY IN THE WAITING ROOM ONLY DURING PRE OP AND PROCEDURE DAY OF SURGERY.   PCP - Bing Matter, PA-C Cardiologist - Dr Gwyndolyn Kaufman Neurology - Dr Marcial Pacas Oncology - Dr [name] Pulmonary - Dr Delorise Shiner  Chest x-ray - 07/18/20 (1V) EKG - 07/18/20 Stress Test - none ECHO - 12/17/19 Cardiac Cath - 09/26/14  Sleep Study -  n/a CPAP - none  Fasting Blood Sugar - unknown Checks Blood Sugar 0 times a day Do not take metformin on the morning of surgery.  Aspirin Instructions: Follow your surgeon's instructions on when to stop aspirin prior to surgery,  If no instructions were given by your surgeon then you will need to call the office for those instructions.  Anesthesia review: Yes  STOP now taking any Aspirin (unless otherwise instructed by your surgeon), Aleve, Naproxen, Ibuprofen, Motrin, Advil, Goody's, BC's, all herbal medications, fish oil, and all vitamins.   Coronavirus Screening Covid test on 10/02/20 was negative. Patient denies any covid symptoms.    Patient verbalized understanding of instructions that were given via phone.

## 2020-10-05 ENCOUNTER — Other Ambulatory Visit: Payer: Self-pay | Admitting: Internal Medicine

## 2020-10-05 ENCOUNTER — Encounter (HOSPITAL_COMMUNITY): Payer: Self-pay | Admitting: Physician Assistant

## 2020-10-05 ENCOUNTER — Encounter (HOSPITAL_COMMUNITY): Payer: Self-pay | Admitting: Pulmonary Disease

## 2020-10-05 DIAGNOSIS — R053 Chronic cough: Secondary | ICD-10-CM

## 2020-10-05 NOTE — Progress Notes (Signed)
Anesthesia Chart Review: Same day workup  Follows with cardiology for history of CAD with history of NSTEMI in 2011 s/p PCI to LAD, tobacco abuse, PVD s.p iliac stenting, hypotension on midodrine HLD. Last cath in 2016 with CTO of RCA with good collateral flow, patent pLAD stent.  Last seen by Dr. Johney Frame 07/07/2020 and stable from cardiovascular standpoint that time.  Recommended 66-month follow-up.  History of COPD.  Former smoker quit in 2018 (44-pack-year history).  Follow-up with pulmonology.  He is currently being monitored off inhaled medications for COPD due to lack of benefit.  Recent chest imaging suspicious for lung cancer.  EBUS recommended.  History of DM2, last A1c 5.3 on 08/03/2020, result in Care Everywhere.  History of multiple UTIs with suprapubic catheter in place.  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  CMP from 09/16/2020 reviewed, unremarkable.  CBC from 09/16/2020 reviewed, mild anemia with hemoglobin 11.6, mild chronic thrombocytopenia with platelets 81K, otherwise unremarkable.  EKG 07/18/2020: Sinus rhythm.  Rate 65.  Anterior infarct, old.  CT chest abdomen pelvis 09/16/2020: IMPRESSION: 1. Interval enlargement of a perihilar nodule anteriorly in the left upper lobe, measuring 2.1 x 1.2 cm, previously 1.4 x 0.9 cm when measured similarly. This is highly concerning for a slowly enlarging primary lung malignancy. Recommend multidisciplinary thoracic referral for consideration of metabolic characterization by PET-CT and tissue sampling. 2. There is a new nodule of the anterior left upper lobe measuring 1.0 cm, nonspecific although worrisome for satellite nodule or metastasis. 3. Unchanged enlargement of a left hilar lymph node, concerning for nodal metastasis. 4. Unchanged consolidation and nodularity of the infrahilar left lower lobe  as well as of the perihilar right middle lobe, these findings generally nonspecific and infectious or inflammatory, appearance particularly suggesting atypical mycobacterial infection. 5. Redemonstrated enlarged right axillary and retroperitoneal lymph nodes, stable to slightly diminished in size. These are in keeping with reported diagnosis of CLL. 6. Slightly improved splenomegaly, maximum coronal span 18.0 cm. 7. Emphysema. 8. Coronary artery disease.  TTE 12/17/2019:  1. Left ventricular ejection fraction, by estimation, is 60 to 65%. The  left ventricle has normal function. The left ventricle has no regional  wall motion abnormalities. Left ventricular diastolic parameters are  consistent with Grade I diastolic  dysfunction (impaired relaxation).   2. Right ventricular systolic function is normal. The right ventricular  size is normal. There is mildly elevated pulmonary artery systolic  pressure. The estimated right ventricular systolic pressure is 58.8 mmHg.   3. Left atrial size was moderately dilated.   4. The mitral valve is abnormal. Mild mitral valve regurgitation.   5. The aortic valve is tricuspid. Aortic valve regurgitation is not  visualized.   6. Aortic dilatation noted. There is mild dilatation of the aortic root,  measuring 39 mm.   7. The inferior vena cava is dilated in size with <50% respiratory  variability, suggesting right atrial pressure of 15 mmHg.   Comparison(s): Changes from prior study are noted. 12/15/2015: LVEF  55-60%, normal LA size, grade 2 DD, dilated aortic root to 3.9 cm, RVSP 33  mmHg.   Cath 09/26/2014: Mid RCA lesion, 100% stenosed. The left ventricular systolic function is normal.   1. Single vessel occlusive CAD with CTO of the RCA with good collaterals. Continued patency of the stent in the LAD 2. Normal LV function   Plan: continue medical therapy.   Karoline Caldwell, PA-C  [facility]/Anesthesiology Phone [phone] 10/05/2020 3:03 PM

## 2020-10-05 NOTE — Anesthesia Preprocedure Evaluation (Deleted)
Anesthesia Evaluation    Airway        Dental   Pulmonary former smoker,           Cardiovascular      Neuro/Psych    GI/Hepatic   Endo/Other  diabetes  Renal/GU      Musculoskeletal   Abdominal   Peds  Hematology   Anesthesia Other Findings   Reproductive/Obstetrics                             Anesthesia Physical Anesthesia Plan  ASA:   Anesthesia Plan:    Post-op Pain Management:    Induction:   PONV Risk Score and Plan:   Airway Management Planned:   Additional Equipment:   Intra-op Plan:   Post-operative Plan:   Informed Consent:   Plan Discussed with:   Anesthesia Plan Comments: (PAT note by Karoline Caldwell, PA-C: Follows with cardiology for history of CAD with history of NSTEMI in 2011 s/p PCI to LAD, tobacco abuse, PVD s.p iliac stenting, hypotension on midodrine HLD. Last cath in 2016 with CTO of RCA with good collateral flow, patent pLAD stent.  Last seen by Dr. Johney Frame 07/07/2020 and stable from cardiovascular standpoint that time.  Recommended 50-month follow-up.  History of COPD.  Former smoker quit in 2018 (44-pack-year history).  Follow-up with pulmonology.  He is currently being monitored off inhaled medications for COPD due to lack of benefit.  Recent chest imaging suspicious for lung cancer.  EBUS recommended.  History of DM2, last A1c 5.3 on 08/03/2020, result in Care Everywhere.  History of multiple UTIs with suprapubic catheter in place.  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  CMP from 09/16/2020 reviewed, unremarkable.  CBC from 09/16/2020 reviewed, mild anemia with hemoglobin 11.6, mild chronic thrombocytopenia with platelets 81K, otherwise unremarkable.  EKG 07/18/2020: Sinus rhythm.  Rate 65.  Anterior infarct, old.  CT chest  abdomen pelvis 09/16/2020: IMPRESSION: 1. Interval enlargement of a perihilar nodule anteriorly in the left upper lobe, measuring 2.1 x 1.2 cm, previously 1.4 x 0.9 cm when measured similarly. This is highly concerning for a slowly enlarging primary lung malignancy. Recommend multidisciplinary thoracic referral for consideration of metabolic characterization by PET-CT and tissue sampling. 2. There is a new nodule of the anterior left upper lobe measuring 1.0 cm, nonspecific although worrisome for satellite nodule or metastasis. 3. Unchanged enlargement of a left hilar lymph node, concerning for nodal metastasis. 4. Unchanged consolidation and nodularity of the infrahilar left lower lobe as well as of the perihilar right middle lobe, these findings generally nonspecific and infectious or inflammatory, appearance particularly suggesting atypical mycobacterial infection. 5. Redemonstrated enlarged right axillary and retroperitoneal lymph nodes, stable to slightly diminished in size. These are in keeping with reported diagnosis of CLL. 6. Slightly improved splenomegaly, maximum coronal span 18.0 cm. 7. Emphysema. 8. Coronary artery disease.  TTE 12/17/2019: 1. Left ventricular ejection fraction, by estimation, is 60 to 65%. The  left ventricle has normal function. The left ventricle has no regional  wall motion abnormalities. Left ventricular diastolic parameters are  consistent with Grade I diastolic  dysfunction (impaired relaxation).  2. Right ventricular systolic function is normal. The right ventricular  size is normal. There is mildly elevated pulmonary artery systolic  pressure. The estimated right ventricular systolic pressure is 67.6 mmHg.  3. Left atrial size was moderately dilated.  4. The mitral valve is abnormal. Mild mitral valve regurgitation.  5. The aortic valve is tricuspid. Aortic valve regurgitation is not  visualized.  6. Aortic dilatation noted. There is mild  dilatation of the aortic root,  measuring 39 mm.  7. The inferior vena cava is dilated in size with <50% respiratory  variability, suggesting right atrial pressure of 15 mmHg.   Comparison(s): Changes from prior study are noted. 12/15/2015: LVEF  55-60%, normal LA size, grade 2 DD, dilated aortic root to 3.9 cm, RVSP 33  mmHg.   Cath 09/26/2014: . Mid RCA lesion, 100% stenosed. . The left ventricular systolic function is normal.  1. Single vessel occlusive CAD with CTO of the RCA with good collaterals. Continued patency of the stent in the LAD 2. Normal LV function  Plan: continue medical therapy.  )        Anesthesia Quick Evaluation

## 2020-10-06 ENCOUNTER — Telehealth: Payer: Self-pay

## 2020-10-06 ENCOUNTER — Ambulatory Visit (HOSPITAL_COMMUNITY): Admission: RE | Admit: 2020-10-06 | Payer: Medicare Other | Source: Home / Self Care | Admitting: Pulmonary Disease

## 2020-10-06 DIAGNOSIS — R911 Solitary pulmonary nodule: Secondary | ICD-10-CM

## 2020-10-06 SURGERY — VIDEO BRONCHOSCOPY WITH ENDOBRONCHIAL NAVIGATION
Anesthesia: General | Laterality: Left

## 2020-10-06 NOTE — Telephone Encounter (Signed)
Wife called and left a message. Ryan Copeland is in the hospital in [place]. Canceling 8/26 appt with Dr. Alvy Bimler. When he is discharged they will call back to reschedule.  Appt canceled.

## 2020-10-08 NOTE — Telephone Encounter (Signed)
PCCM:  Patient was admitted to the hospital for urinary tract infection and was unable to make bronchoscopy appointment.  Please reschedule patient for 10/29/2020.  This slot has been held to follow my first case of the day.  Super D CT scan has been ordered.  We were unable to use the previous scan anyways.  Please ensure that a new CT scan has been completed prior to bronchoscopy.  CC Vista Lawman, RN  [name], DO [facility] 10/08/2020 3:08 PM

## 2020-10-09 ENCOUNTER — Ambulatory Visit: Payer: Medicare Other | Admitting: Hematology and Oncology

## 2020-10-09 NOTE — Telephone Encounter (Addendum)
Pt's ENB was moved to 9/13 @ 9:30, checking in by 7 AM  New case# is [id number].  Pt's f/u with Dr. Shearon Stalls has been moved to her first available after ENB - 10/11 at 3:30 PM.   Ryan Copeland is working on getting [employer] D CT scheduled.   Once complete all complete, I will make patient aware & document.

## 2020-10-13 ENCOUNTER — Telehealth: Payer: Self-pay | Admitting: Pulmonary Disease

## 2020-10-13 NOTE — Telephone Encounter (Signed)
LM informing patient we would get this message to Dr Valeta Harms and get back with him.   BI please advise. Thanks :)

## 2020-10-15 NOTE — Telephone Encounter (Signed)
Per Dr. Valeta Harms, CT & bronch cancelled.  Pt has been scheduled to see Dr. Valeta Harms on 11/25/2020.

## 2020-10-16 ENCOUNTER — Other Ambulatory Visit: Payer: Medicare Other

## 2020-10-16 ENCOUNTER — Ambulatory Visit: Payer: Medicare Other | Admitting: Hematology and Oncology

## 2020-10-16 ENCOUNTER — Ambulatory Visit: Payer: Medicare Other

## 2020-10-20 ENCOUNTER — Ambulatory Visit (HOSPITAL_COMMUNITY): Payer: Medicare Other

## 2020-10-22 ENCOUNTER — Telehealth: Payer: Self-pay

## 2020-10-22 ENCOUNTER — Other Ambulatory Visit (HOSPITAL_COMMUNITY): Payer: Self-pay

## 2020-10-22 MED FILL — Acalabrutinib Cap 100 MG: ORAL | 30 days supply | Qty: 60 | Fill #5 | Status: AC

## 2020-10-22 NOTE — Telephone Encounter (Signed)
Called and scheduled appt with Dr. Alvy Bimler for 9/13. Wife is aware of appts. She requested appt for port/flush to draw labs. Appts scheduled.

## 2020-10-27 ENCOUNTER — Other Ambulatory Visit: Payer: Self-pay

## 2020-10-27 ENCOUNTER — Ambulatory Visit
Admission: RE | Admit: 2020-10-27 | Discharge: 2020-10-27 | Disposition: A | Discharge status: Home / Self Care | Payer: Self-pay | Source: Ambulatory Visit | Attending: Hematology and Oncology | Admitting: Hematology and Oncology

## 2020-10-27 ENCOUNTER — Inpatient Hospital Stay: Payer: Medicare Other | Attending: Hematology and Oncology

## 2020-10-27 ENCOUNTER — Other Ambulatory Visit (HOSPITAL_COMMUNITY): Payer: Self-pay

## 2020-10-27 ENCOUNTER — Inpatient Hospital Stay (HOSPITAL_BASED_OUTPATIENT_CLINIC_OR_DEPARTMENT_OTHER): Payer: Medicare Other | Admitting: Hematology and Oncology

## 2020-10-27 ENCOUNTER — Encounter: Payer: Self-pay | Admitting: Hematology and Oncology

## 2020-10-27 DIAGNOSIS — C8308 Small cell B-cell lymphoma, lymph nodes of multiple sites: Secondary | ICD-10-CM | POA: Diagnosis not present

## 2020-10-27 DIAGNOSIS — Z79899 Other long term (current) drug therapy: Secondary | ICD-10-CM | POA: Diagnosis not present

## 2020-10-27 DIAGNOSIS — D61818 Other pancytopenia: Secondary | ICD-10-CM | POA: Diagnosis not present

## 2020-10-27 DIAGNOSIS — C3492 Malignant neoplasm of unspecified part of left bronchus or lung: Secondary | ICD-10-CM

## 2020-10-27 DIAGNOSIS — R5381 Other malaise: Secondary | ICD-10-CM | POA: Insufficient documentation

## 2020-10-27 DIAGNOSIS — C9110 Chronic lymphocytic leukemia of B-cell type not having achieved remission: Secondary | ICD-10-CM | POA: Insufficient documentation

## 2020-10-27 DIAGNOSIS — Z95828 Presence of other vascular implants and grafts: Secondary | ICD-10-CM

## 2020-10-27 DIAGNOSIS — D801 Nonfamilial hypogammaglobulinemia: Secondary | ICD-10-CM | POA: Diagnosis not present

## 2020-10-27 DIAGNOSIS — R600 Localized edema: Secondary | ICD-10-CM

## 2020-10-27 LAB — COMPREHENSIVE METABOLIC PANEL
ALT: 18 U/L (ref 0–44)
AST: 26 U/L (ref 15–41)
Albumin: 3.3 g/dL — ABNORMAL LOW (ref 3.5–5.0)
Alkaline Phosphatase: 95 U/L (ref 38–126)
Anion gap: 9 (ref 5–15)
BUN: 12 mg/dL (ref 8–23)
CO2: 28 mmol/L (ref 22–32)
Calcium: 8.7 mg/dL — ABNORMAL LOW (ref 8.9–10.3)
Chloride: 102 mmol/L (ref 98–111)
Creatinine, Ser: 1.05 mg/dL (ref 0.61–1.24)
GFR, Estimated: >60 mL/min (ref >60)
Glucose, Bld: 116 mg/dL — ABNORMAL HIGH (ref 70–99)
Potassium: 4.5 mmol/L (ref 3.5–5.1)
Sodium: 139 mmol/L (ref 135–145)
Total Bilirubin: 0.5 mg/dL (ref 0.3–1.2)
Total Protein: 5.9 g/dL — ABNORMAL LOW (ref 6.5–8.1)

## 2020-10-27 LAB — CBC WITH DIFFERENTIAL/PLATELET
Abs Immature Granulocytes: 0.07 10*3/uL (ref 0.00–0.07)
Basophils Absolute: 0.1 10*3/uL (ref 0.0–0.1)
Basophils Relative: 0 %
Eosinophils Absolute: 0.1 10*3/uL (ref 0.0–0.5)
Eosinophils Relative: 0 %
HCT: 35.3 % — ABNORMAL LOW (ref 39.0–52.0)
Hemoglobin: 10.8 g/dL — ABNORMAL LOW (ref 13.0–17.0)
Immature Granulocytes: 0 %
Lymphocytes Relative: 79 %
Lymphs Abs: 35.5 10*3/uL — ABNORMAL HIGH (ref 0.7–4.0)
MCH: 28.6 pg (ref 26.0–34.0)
MCHC: 30.6 g/dL (ref 30.0–36.0)
MCV: 93.6 fL (ref 80.0–100.0)
Monocytes Absolute: 6.9 10*3/uL — ABNORMAL HIGH (ref 0.1–1.0)
Monocytes Relative: 15 %
Neutro Abs: 2.6 10*3/uL (ref 1.7–7.7)
Neutrophils Relative %: 6 %
Platelets: 69 10*3/uL — ABNORMAL LOW (ref 150–400)
RBC: 3.77 MIL/uL — ABNORMAL LOW (ref 4.22–5.81)
RDW: 22.1 % — ABNORMAL HIGH (ref 11.5–15.5)
WBC: 45.2 10*3/uL — ABNORMAL HIGH (ref 4.0–10.5)
nRBC: 0 % (ref 0.0–0.2)

## 2020-10-27 MED ORDER — SODIUM CHLORIDE 0.9% FLUSH
10.0000 mL | Freq: Once | INTRAVENOUS | Status: AC
  Administered 2020-10-27: 10 mL

## 2020-10-27 MED ORDER — HEPARIN SOD (PORK) LOCK FLUSH 100 UNIT/ML IV SOLN
500.0000 [IU] | Freq: Once | INTRAVENOUS | Status: AC
  Administered 2020-10-27: 500 [IU]

## 2020-10-27 NOTE — Assessment & Plan Note (Signed)
He missed his recent IVIG treatment The patient is prone to get recurrent infection and sepsis I will resume IVIG treatment while he is being investigated for lung cancer along with his ongoing treatment for CLL

## 2020-10-27 NOTE — Assessment & Plan Note (Signed)
He has slight worsening leukocytosis due to recent interruption of his Calquence He has resumed taking Calquence without problems He had multiple imaging studies done recently

## 2020-10-27 NOTE — Assessment & Plan Note (Signed)
His bronchoscopy was canceled due to recent hospitalization and sepsis He had multiple imaging studies done in the other hospital and we will get imaging downloaded in our system I will reach out to his pulmonologist to reschedule his bronchoscopy and biopsy and I will see him back after results are available

## 2020-10-27 NOTE — Assessment & Plan Note (Signed)
He has slight pancytopenia, multifactorial in His chronic thrombocytopenia stable He had recent anemia due to anemia chronic illness and that is stable He does not need further work-up or transfusion support

## 2020-10-27 NOTE — Assessment & Plan Note (Signed)
His bilateral lower extremity edema is due to slight fluid overload, low albumin status and poor mobility I recommend close observation and focus on high-protein diet and increase mobility

## 2020-10-27 NOTE — Assessment & Plan Note (Signed)
He is quite debilitated and weakened from recent ICU hospitalization I recommend referral to physical therapy and rehab and he is in agreement

## 2020-10-27 NOTE — Progress Notes (Signed)
[place] OFFICE PROGRESS NOTE  Patient Care Team: [name]., PA-C as PCP - General (Family Medicine) Burtis Junes, NP (Inactive) as Nurse Practitioner (Cardiology) Carol Ada, MD as Consulting Physician (Gastroenterology)  ASSESSMENT & PLAN:  Non-small cell carcinoma of lung, left [facility]) His bronchoscopy was canceled due to recent hospitalization and sepsis He had multiple imaging studies done in the other hospital and we will get imaging downloaded in our system I will reach out to his pulmonologist to reschedule his bronchoscopy and biopsy and I will see him back after results are available  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 [facility]) He has slight worsening leukocytosis due to recent interruption of his Calquence He has resumed taking Calquence without problems He had multiple imaging studies done recently   Hypogammaglobulinemia, acquired He missed his recent IVIG treatment The patient is prone to get recurrent infection and sepsis I will resume IVIG treatment while he is being investigated for lung cancer along with his ongoing treatment for CLL  Pancytopenia, acquired [facility]) He has slight pancytopenia, multifactorial in His chronic thrombocytopenia stable He had recent anemia due to anemia chronic illness and that is stable He does not need further work-up or transfusion support  Bilateral leg edema His bilateral lower extremity edema is due to slight fluid overload, low albumin status and poor mobility I recommend close observation and focus on high-protein diet and increase mobility  Physical debility He is quite debilitated and weakened from recent ICU hospitalization I recommend referral to physical therapy and rehab and he is in agreement  Orders Placed This Encounter  Procedures   Ambulatory referral to Physical Therapy    Referral Priority:   Routine    Referral Type:   Physical Medicine    Referral Reason:    Specialty Services Required    Requested Specialty:   Physical Therapy    Number of Visits Requested:   1    All questions were answered. The patient knows to call the clinic with any problems, questions or concerns. The total time spent in the appointment was 40 minutes encounter with patients including review of chart and various tests results, discussions about plan of care and coordination of care plan   [name], MD 10/27/2020 10:32 AM  INTERVAL HISTORY: Please see below for problem oriented charting. he returns for follow-up due to recent hospitalization According to his wife, approximately 2-1/2 weeks ago he developed fever and chills EMS was called and he was transferred to an outside hospital He was hospitalized for approximately 2 weeks The patient had significant delirium in the hospital and was on restrain for a long time He also had fall with splenic laceration, managed conservatively He did receive blood transfusion His chemotherapy was placed on hold for a while and resumed approximately a week ago Since discharge from the hospital, he felt generalized weakness His wife reported some mild intermittent confusion but overall improving He denies further fever or chills No recent cough He has mild chronic diarrhea, stable He is noted to have 10 pound weight gain since recent hospitalization  REVIEW OF SYSTEMS:   Constitutional: Denies fevers, chills or abnormal weight loss Eyes: Denies blurriness of vision Ears, nose, mouth, throat, and face: Denies mucositis or sore throat Respiratory: Denies cough, dyspnea or wheezes Skin: Denies abnormal skin rashes Lymphatics: Denies new lymphadenopathy  Behavioral/Psych: Mood is stable, no new changes  All other systems were reviewed with the patient and are negative.  I have reviewed the past medical history, past surgical history, social history and family history with the patient and they are unchanged from previous  note.  ALLERGIES:  is allergic to immune globulin and codeine.  MEDICATIONS:  Current Outpatient Medications  Medication Sig Dispense Refill   acalabrutinib (CALQUENCE) 100 MG capsule TAKE 1 CAPSULE BY MOUTH TWICE A DAY (Patient taking differently: Take 100 mg by mouth at bedtime.) 60 capsule 11   acetaminophen (TYLENOL) 325 MG tablet Take 2 tablets (650 mg total) by mouth every 6 (six) hours as needed for moderate pain or fever. 30 tablet 0   acyclovir (ZOVIRAX) 400 MG tablet TAKE 1 TABLET BY MOUTH TWICE A DAY (Patient taking differently: Take 400 mg by mouth 2 (two) times daily.) 180 tablet 1   allopurinol (ZYLOPRIM) 300 MG tablet Take 1 tablet (300 mg total) by mouth daily. 90 tablet 1   aspirin EC 81 MG tablet Take 81 mg by mouth daily. Swallow whole.     b complex vitamins tablet Take 1 tablet by mouth daily.      benzonatate (TESSALON) 100 MG capsule TAKE TWO CAPSULES BY MOUTH THREE TIMES A DAY AS NEEDED (Patient taking differently: Take 200 mg by mouth 3 (three) times daily as needed for cough.) 30 capsule 2   Cholecalciferol 25 MCG (1000 UT) tablet Take 1,000 Units by mouth daily.      diphenoxylate-atropine (LOMOTIL) 2.5-0.025 MG tablet TAKE 1 TABLET BY MOUTH 4 (FOUR) TIMES DAILY AS NEEDED FOR DIARRHEA OR LOOSE STOOLS. 60 tablet 1   famotidine (PEPCID) 20 MG tablet TAKE 1 TABLET BY MOUTH TWICE A DAY (Patient taking differently: Take 20 mg by mouth daily as needed for heartburn.) 180 tablet 1   HYDROcodone-acetaminophen (NORCO) 10-325 MG tablet Take 1 tablet by mouth every 6 (six) hours as needed for moderate pain.     lidocaine-prilocaine (EMLA) cream Apply 1 application topically as needed (prior chemo/IBIG). 30 g 9   loperamide (IMODIUM) 2 MG capsule Take 2 capsules (4 mg total) by mouth 4 (four) times daily. (Patient taking differently: Take 4 mg by mouth 2 (two) times daily.) 120 capsule 9   meclizine (ANTIVERT) 12.5 MG tablet Take 12.5 mg by mouth 3 (three) times daily as needed for  dizziness.     metFORMIN (GLUCOPHAGE-XR) 500 MG 24 hr tablet Take 500 mg by mouth every evening.     midodrine (PROAMATINE) 5 MG tablet Take 1.5 tablets (7.5 mg total) by mouth 3 (three) times daily with meals. (Patient taking differently: Take 10 mg by mouth 3 (three) times daily with meals.) 135 tablet 1   mometasone (NASONEX) 50 MCG/ACT nasal spray Place 2 sprays into the nose daily as needed for allergies.     montelukast (SINGULAIR) 10 MG tablet TAKE 1 TABLET BY MOUTH EVERY DAY (Patient taking differently: Take 10 mg by mouth at bedtime.) 90 tablet 1   nitroGLYCERIN (NITROSTAT) 0.4 MG SL tablet PLACE 1 TABLET UNDER THE TONGUE EVERY 5 MINUTES X 3 DOSES AS NEEDED FOR CHEST PAIN *MAX 3 DOSES* 25 tablet 7   oxybutynin (DITROPAN) 5 MG tablet Take 5 mg by mouth 2 (two) times daily as needed for bladder spasms.     pregabalin (LYRICA) 200 MG capsule TAKE 1 CAPSULE (200 MG TOTAL) BY MOUTH 3 (THREE) TIMES DAILY. 270 capsule 1   rosuvastatin (CRESTOR) 5 MG tablet Take 1 tablet (5 mg total) by mouth daily. 90 tablet 3   sildenafil (VIAGRA) 100 MG tablet Take 0.5  tablets (50 mg total) by mouth as needed for erectile dysfunction (30 mintues prior to sexual intercourse). 10 tablet 3   tamsulosin (FLOMAX) 0.4 MG CAPS capsule Take 1 capsule (0.4 mg total) by mouth daily. (Patient taking differently: Take 0.4 mg by mouth at bedtime.) 30 capsule 0   zolpidem (AMBIEN) 10 MG tablet Take 10 mg by mouth at bedtime.     No current facility-administered medications for this visit.   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  No evidence of aortic dissection or other acute process in the chest. 2.  Centrilobular emphysema with a 5 mm right lung nodule. Given the concurrent centrilobular emphysema, follow-up chest CT at 6 -12 months is recommended.  3.  Coronary artery atherosclerosis which is age advanced. 4.  Prominent thoracic lymph nodes.  These can be reevaluated at follow-up.   05/06/2010 Imaging   1.  Multiple small periaortic lymph nodes consistent with the patient's history of the chronic lymphocytic leukemia. 2.  No evidence of solid organ involvement   11/03/2011 Imaging   1.  Interval progression of abdominal and pelvic adenopathy. 2.  Progression of splenomegaly.  The spleen now measures 23 cm in length   11/18/2011 Bone Marrow Biopsy   Bone Marrow, Aspirate,Biopsy, and Clot, right iliac bone - HYPERCELLULAR BONE MARROW WITH EXTENSIVE INVOLVEMENT BY CHRONIC LYMPHOCYTIC LEUKEMIA. PERIPHERAL BLOOD: - CHRONIC LYMPHOCYTIC LEUKEMIA   03/08/2012 Imaging   1.  Progressive increase in retroperitoneal, iliac, and inguinal lymphadenopathy. 2.  Interval increase in massive splenomegaly.     06/01/2012 Procedure   Placement of single lumen port a cath via right internal jugular vein.  The catheter tip lies at the cavoatrial junction.  A power injectable port a cath was placed and is ready for immediate use   06/20/2012 - 11/23/2012 Chemotherapy   He received FCR x 6 cycles   12/19/2012 Imaging   Left common iliac stent. Abdominal vasculature remains patent. Improving supraclavicular and axillary lymphadenopathy. Residual right subpectoral nodes measure up to 10 mm short axis. Improving retroperitoneal lymphadenopathy, measuring up to 16 mm short axis. Improving splenomegaly, measuring 18.7 cm.     01/20/2016 Imaging   1. Stable exam.  No new or progressive  findings. 2. No CT findings to explain odynophagia   09/02/2016 Pathology Results   The findings are consistent with involvement by previously known chronic lymphocytic leukemia   09/02/2016 Pathology Results   FISH for CLL came back positive for deletion 13q   09/15/2016 Imaging   1. Borderline enlarged abdominal and pelvic lymph nodes. Compared with 11/03/2011 these are decreased in size as detailed above. 2. Persistent splenomegaly. 3. Aortic Atherosclerosis (ICD10-I70.0). LAD coronary artery calcification noted.   10/06/2016 - 02/24/2017 Chemotherapy   He received Bendamustine and Rituxan   11/03/2016 Adverse Reaction   Dose of Bendamustine is reduced due to severe pancytopenia   12/28/2016 Imaging   1. Borderline enlarged  abdominal peritoneal ligament and abdominal retroperitoneal lymph nodes, stable. 2. Splenomegaly. 3.  Aortic atherosclerosis (ICD10-170.0).   03/22/2017 PET scan   1. No hypermetabolic adenopathy identified within the neck, chest, abdomen or pelvis. 2. Prominent left retroperitoneal node measures 1.6 cm without significant FDG uptake. 3. Splenomegaly. 4. Aortic Atherosclerosis (ICD10-I70.0) and Emphysema (ICD10-J43.9). LAD and left circumflex atherosclerotic calcifications noted.   02/02/2018 Procedure   IMPRESSION: Widely patent right IJ power port catheter.   04/06/2018 Imaging   1. Interval enlargement of axillary, mediastinal, and retroperitoneal lymph nodes, as well as increased splenomegaly, findings concerning for progression of CLL in comparison to prior PET-CT dated 03/22/2017.   2.  Other chronic and incidental findings as detailed above.     04/16/2018 -  Chemotherapy   The patient had acalabrutinib for chemo   04/17/2018 - 04/21/2018 Hospital Admission   He was hospitalized for influenza   11/29/2018 Imaging   1. Interval response to therapy. No thoracic added not scratch set no thoracic or pelvic adenopathy identified. Significant improvement in  abdominal adenopathy. Largest remaining lymph node measures 1.3cm in the left retroperitoneal region. Previously 2.5 cm. 2. Persistent splenomegaly, improved from previous exam. 3. No new or progressive disease identified within the chest, abdomen or pelvis. 4. Stable appearance of 5 mm right middle lobe lung nodule. 5. Aortic Atherosclerosis (ICD10-I70.0) and Emphysema (ICD10-J43.9). Coronary artery calcifications.     09/17/2020 Imaging   CT neck  Negative for mass or adenopathy in the neck. No evidence of recurrent CLL   09/17/2020 Imaging   1. Interval enlargement of a perihilar nodule anteriorly in the left upper lobe, measuring 2.1 x 1.2 cm, previously 1.4 x 0.9 cm when measured similarly. This is highly concerning for a slowly enlarging primary lung malignancy. Recommend multidisciplinary thoracic referral for consideration of metabolic characterization by PET-CT and tissue sampling. 2. There is a new nodule of the anterior left upper lobe measuring 1.0 cm, nonspecific although worrisome for satellite nodule or metastasis. 3. Unchanged enlargement of a left hilar lymph node, concerning for nodal metastasis. 4. Unchanged consolidation and nodularity of the infrahilar left lower lobe as well as of the perihilar right middle lobe, these findings generally nonspecific and infectious or inflammatory, appearance particularly suggesting atypical mycobacterial infection.  5. Redemonstrated enlarged right axillary and retroperitoneal lymph nodes, stable to slightly diminished in size. These are in keeping with reported diagnosis of CLL. 6. Slightly improved splenomegaly, maximum coronal span 18.0 cm.  7. Emphysema. 8. Coronary artery disease.   Aortic Atherosclerosis (ICD10-I70.0) and Emphysema (ICD10-J43.9).   Non-small cell carcinoma of lung, left ([place])  09/18/2020 Initial Diagnosis   Non-small cell carcinoma of lung, left ([place])   09/20/2020 Cancer Staging   Staging form: Lung, AJCC 8th  Edition - Clinical stage from 09/20/2020: Stage IVA (cT3, cN1, cM1a) - Signed by [name], MD on 09/20/2020 Stage prefix: Initial diagnosis   09/30/2020 Imaging   IMPRESSION MRI brain No evidence of metastatic disease in the brain.   10/05/2020 PET scan   Hypermetabolic 1 cm nodule in the LEFT upper lobe suspicious for pulmonary neoplasm. Referral to multi disciplinary thoracic oncologic setting is suggested if not yet performed. Biopsy may be warranted.   Juxta hilar nodule that shows enlargement over time and associated LEFT hilar lymph node without pronounced FDG uptake but given enlargement and uptake above blood pool raising the question of indolent neoplasm in this location.   Stable small pulmonary nodule in the RIGHT middle lobe and stable RIGHT  upper lobe subsolid nodule, attention on follow-up.   RIGHT axillary and retroperitoneal nodal enlargement with mildly elevated FDG uptake near mediastinal blood pool with respect to retroperitoneal lymph nodes, likely related to the patient's known CLL. Splenic size remains smaller than in 2022. Close attention on follow-up is suggested.   Aortic Atherosclerosis (ICD10-I70.0).   Emphysema (ICD10-J43.9).     PHYSICAL EXAMINATION: ECOG PERFORMANCE STATUS: 2 - Symptomatic, <50% confined to bed  Vitals:   10/27/20 0911  BP: (!) 120/50  Pulse: 73  Resp: 18  Temp: 97.7 F (36.5 C)  SpO2: 99%   Filed Weights   10/27/20 0911  Weight: 175 lb 3.2 oz (79.5 kg)    GENERAL:alert, no distress and comfortable SKIN: Noted multiple bruises and skin discoloration from prior bruises, stable EYES: normal, Conjunctiva are pink and non-injected, sclera clear OROPHARYNX:no exudate, no erythema and lips, buccal mucosa, and tongue normal  NECK: supple, thyroid normal size, non-tender, without nodularity LYMPH:  no palpable lymphadenopathy in the cervical, axillary or inguinal LUNGS: clear to auscultation and percussion with normal breathing  effort HEART: regular rate & rhythm and no murmurs with moderate bilateral lower extremity edema ABDOMEN:abdomen soft, non-tender and normal bowel sounds Musculoskeletal:no cyanosis of digits and no clubbing  NEURO: alert & oriented x 3 with fluent speech, no focal motor/sensory deficits  LABORATORY DATA:  I have reviewed the data as listed    Component Value Date/Time   NA 139 10/27/2020 0844   NA 142 06/19/2019 0848   NA 136 01/26/2017 0940   K 4.5 10/27/2020 0844   K 4.0 01/26/2017 0940   CL 102 10/27/2020 0844   CL 104 08/03/2012 1229   CO2 28 10/27/2020 0844   CO2 23 01/26/2017 0940   GLUCOSE 116 (H) 10/27/2020 0844   GLUCOSE 146 (H) 01/26/2017 0940   GLUCOSE 223 (H) 08/03/2012 1229   BUN 12 10/27/2020 0844   BUN 13 06/19/2019 0848   BUN 9.6 01/26/2017 0940   CREATININE 1.05 10/27/2020 0844   CREATININE 1.04 04/24/2018 0821   CREATININE 1.0 01/26/2017 0940   CALCIUM 8.7 (L) 10/27/2020 0844   CALCIUM 8.7 01/26/2017 0940   PROT 5.9 (L) 10/27/2020 0844   PROT 5.9 (L) 04/13/2017 0823   PROT 5.7 (L) 01/26/2017 0940   ALBUMIN 3.3 (L) 10/27/2020 0844   ALBUMIN 3.6 01/26/2017 0940   AST 26 10/27/2020 0844   AST 13 (L) 04/24/2018 0821   AST 13 01/26/2017 0940   ALT 18 10/27/2020 0844   ALT 10 04/24/2018 0821   ALT 12 01/26/2017 0940   ALKPHOS 95 10/27/2020 0844   ALKPHOS 78 01/26/2017 0940   BILITOT 0.5 10/27/2020 0844   BILITOT 0.7 04/24/2018 0821   BILITOT 0.64 01/26/2017 0940   GFRNONAA >60 10/27/2020 0844   GFRNONAA >60 04/24/2018 0821   GFRAA >60 10/25/2019 0819   GFRAA >60 04/24/2018 0821    No results found for: SPEP, UPEP  Lab Results  Component Value Date   WBC 45.2 (H) 10/27/2020   NEUTROABS 2.6 10/27/2020   HGB 10.8 (L) 10/27/2020   HCT 35.3 (L) 10/27/2020   MCV 93.6 10/27/2020   PLT 69 (L) 10/27/2020      Chemistry      Component Value Date/Time   NA 139 10/27/2020 0844   NA 142 06/19/2019 0848   NA 136 01/26/2017 0940   K 4.5 10/27/2020  0844   K 4.0 01/26/2017 0940   CL 102 10/27/2020 0844   CL 104 08/03/2012  1229   CO2 28 10/27/2020 0844   CO2 23 01/26/2017 0940   BUN 12 10/27/2020 0844   BUN 13 06/19/2019 0848   BUN 9.6 01/26/2017 0940   CREATININE 1.05 10/27/2020 0844   CREATININE 1.04 04/24/2018 0821   CREATININE 1.0 01/26/2017 0940      Component Value Date/Time   CALCIUM 8.7 (L) 10/27/2020 0844   CALCIUM 8.7 01/26/2017 0940   ALKPHOS 95 10/27/2020 0844   ALKPHOS 78 01/26/2017 0940   AST 26 10/27/2020 0844   AST 13 (L) 04/24/2018 0821   AST 13 01/26/2017 0940   ALT 18 10/27/2020 0844   ALT 10 04/24/2018 0821   ALT 12 01/26/2017 0940   BILITOT 0.5 10/27/2020 0844   BILITOT 0.7 04/24/2018 0821   BILITOT 0.64 01/26/2017 0940

## 2020-10-28 ENCOUNTER — Ambulatory Visit: Payer: Medicare Other | Admitting: Internal Medicine

## 2020-10-29 ENCOUNTER — Ambulatory Visit (HOSPITAL_COMMUNITY): Admit: 2020-10-29 | Payer: Medicare Other | Admitting: Pulmonary Disease

## 2020-10-29 ENCOUNTER — Encounter (HOSPITAL_COMMUNITY): Payer: Self-pay

## 2020-10-29 ENCOUNTER — Other Ambulatory Visit: Payer: Self-pay | Admitting: Hematology and Oncology

## 2020-10-29 SURGERY — VIDEO BRONCHOSCOPY WITH ENDOBRONCHIAL NAVIGATION
Anesthesia: General | Laterality: Left

## 2020-11-02 ENCOUNTER — Encounter (HOSPITAL_COMMUNITY): Payer: Self-pay

## 2020-11-02 ENCOUNTER — Other Ambulatory Visit: Payer: Self-pay

## 2020-11-02 ENCOUNTER — Emergency Department (HOSPITAL_COMMUNITY): Payer: Medicare Other

## 2020-11-02 ENCOUNTER — Inpatient Hospital Stay (HOSPITAL_COMMUNITY)
Admission: EM | Admit: 2020-11-02 | Discharge: 2020-11-06 | DRG: 871 | Disposition: A | Discharge status: Home Health | Payer: Medicare Other | Attending: Student | Admitting: Student

## 2020-11-02 DIAGNOSIS — Z87891 Personal history of nicotine dependence: Secondary | ICD-10-CM | POA: Diagnosis not present

## 2020-11-02 DIAGNOSIS — K529 Noninfective gastroenteritis and colitis, unspecified: Secondary | ICD-10-CM | POA: Diagnosis present

## 2020-11-02 DIAGNOSIS — N312 Flaccid neuropathic bladder, not elsewhere classified: Secondary | ICD-10-CM | POA: Diagnosis present

## 2020-11-02 DIAGNOSIS — R591 Generalized enlarged lymph nodes: Secondary | ICD-10-CM | POA: Diagnosis present

## 2020-11-02 DIAGNOSIS — K219 Gastro-esophageal reflux disease without esophagitis: Secondary | ICD-10-CM | POA: Diagnosis present

## 2020-11-02 DIAGNOSIS — E785 Hyperlipidemia, unspecified: Secondary | ICD-10-CM | POA: Diagnosis present

## 2020-11-02 DIAGNOSIS — G928 Other toxic encephalopathy: Secondary | ICD-10-CM | POA: Diagnosis present

## 2020-11-02 DIAGNOSIS — C3492 Malignant neoplasm of unspecified part of left bronchus or lung: Secondary | ICD-10-CM | POA: Diagnosis present

## 2020-11-02 DIAGNOSIS — Z8249 Family history of ischemic heart disease and other diseases of the circulatory system: Secondary | ICD-10-CM

## 2020-11-02 DIAGNOSIS — R001 Bradycardia, unspecified: Secondary | ICD-10-CM | POA: Diagnosis not present

## 2020-11-02 DIAGNOSIS — R519 Headache, unspecified: Secondary | ICD-10-CM | POA: Diagnosis not present

## 2020-11-02 DIAGNOSIS — D801 Nonfamilial hypogammaglobulinemia: Secondary | ICD-10-CM | POA: Diagnosis present

## 2020-11-02 DIAGNOSIS — N179 Acute kidney failure, unspecified: Secondary | ICD-10-CM | POA: Diagnosis present

## 2020-11-02 DIAGNOSIS — Z885 Allergy status to narcotic agent status: Secondary | ICD-10-CM

## 2020-11-02 DIAGNOSIS — C9110 Chronic lymphocytic leukemia of B-cell type not having achieved remission: Secondary | ICD-10-CM | POA: Diagnosis present

## 2020-11-02 DIAGNOSIS — I9589 Other hypotension: Secondary | ICD-10-CM | POA: Diagnosis present

## 2020-11-02 DIAGNOSIS — A419 Sepsis, unspecified organism: Secondary | ICD-10-CM | POA: Diagnosis present

## 2020-11-02 DIAGNOSIS — G6181 Chronic inflammatory demyelinating polyneuritis: Secondary | ICD-10-CM | POA: Diagnosis present

## 2020-11-02 DIAGNOSIS — R6521 Severe sepsis with septic shock: Secondary | ICD-10-CM | POA: Diagnosis present

## 2020-11-02 DIAGNOSIS — J432 Centrilobular emphysema: Secondary | ICD-10-CM | POA: Diagnosis present

## 2020-11-02 DIAGNOSIS — D696 Thrombocytopenia, unspecified: Secondary | ICD-10-CM | POA: Diagnosis present

## 2020-11-02 DIAGNOSIS — I252 Old myocardial infarction: Secondary | ICD-10-CM

## 2020-11-02 DIAGNOSIS — Z7984 Long term (current) use of oral hypoglycemic drugs: Secondary | ICD-10-CM

## 2020-11-02 DIAGNOSIS — N401 Enlarged prostate with lower urinary tract symptoms: Secondary | ICD-10-CM | POA: Diagnosis present

## 2020-11-02 DIAGNOSIS — Z9359 Other cystostomy status: Secondary | ICD-10-CM

## 2020-11-02 DIAGNOSIS — F32A Depression, unspecified: Secondary | ICD-10-CM | POA: Diagnosis present

## 2020-11-02 DIAGNOSIS — Z20822 Contact with and (suspected) exposure to covid-19: Secondary | ICD-10-CM | POA: Diagnosis present

## 2020-11-02 DIAGNOSIS — Z9049 Acquired absence of other specified parts of digestive tract: Secondary | ICD-10-CM

## 2020-11-02 DIAGNOSIS — E1151 Type 2 diabetes mellitus with diabetic peripheral angiopathy without gangrene: Secondary | ICD-10-CM | POA: Diagnosis present

## 2020-11-02 DIAGNOSIS — I2510 Atherosclerotic heart disease of native coronary artery without angina pectoris: Secondary | ICD-10-CM | POA: Diagnosis present

## 2020-11-02 DIAGNOSIS — Z955 Presence of coronary angioplasty implant and graft: Secondary | ICD-10-CM

## 2020-11-02 DIAGNOSIS — R0902 Hypoxemia: Secondary | ICD-10-CM | POA: Diagnosis present

## 2020-11-02 DIAGNOSIS — R5383 Other fatigue: Secondary | ICD-10-CM

## 2020-11-02 DIAGNOSIS — W19XXXA Unspecified fall, initial encounter: Secondary | ICD-10-CM

## 2020-11-02 DIAGNOSIS — J189 Pneumonia, unspecified organism: Secondary | ICD-10-CM | POA: Diagnosis present

## 2020-11-02 DIAGNOSIS — Z8701 Personal history of pneumonia (recurrent): Secondary | ICD-10-CM

## 2020-11-02 DIAGNOSIS — F419 Anxiety disorder, unspecified: Secondary | ICD-10-CM | POA: Diagnosis present

## 2020-11-02 DIAGNOSIS — Z87440 Personal history of urinary (tract) infections: Secondary | ICD-10-CM | POA: Diagnosis not present

## 2020-11-02 DIAGNOSIS — Z801 Family history of malignant neoplasm of trachea, bronchus and lung: Secondary | ICD-10-CM

## 2020-11-02 DIAGNOSIS — Z888 Allergy status to other drugs, medicaments and biological substances status: Secondary | ICD-10-CM

## 2020-11-02 DIAGNOSIS — E876 Hypokalemia: Secondary | ICD-10-CM | POA: Diagnosis present

## 2020-11-02 DIAGNOSIS — I959 Hypotension, unspecified: Secondary | ICD-10-CM

## 2020-11-02 DIAGNOSIS — Z79899 Other long term (current) drug therapy: Secondary | ICD-10-CM

## 2020-11-02 LAB — COMPREHENSIVE METABOLIC PANEL
ALT: 23 U/L (ref 0–44)
AST: 45 U/L — ABNORMAL HIGH (ref 15–41)
Albumin: 2.8 g/dL — ABNORMAL LOW (ref 3.5–5.0)
Alkaline Phosphatase: 67 U/L (ref 38–126)
Anion gap: 10 (ref 5–15)
BUN: 23 mg/dL (ref 8–23)
CO2: 22 mmol/L (ref 22–32)
Calcium: 7.8 mg/dL — ABNORMAL LOW (ref 8.9–10.3)
Chloride: 108 mmol/L (ref 98–111)
Creatinine, Ser: 1.22 mg/dL (ref 0.61–1.24)
GFR, Estimated: >60 mL/min (ref >60)
Glucose, Bld: 86 mg/dL (ref 70–99)
Potassium: 3.4 mmol/L — ABNORMAL LOW (ref 3.5–5.1)
Sodium: 140 mmol/L (ref 135–145)
Total Bilirubin: 0.7 mg/dL (ref 0.3–1.2)
Total Protein: 4.9 g/dL — ABNORMAL LOW (ref 6.5–8.1)

## 2020-11-02 LAB — CBC WITH DIFFERENTIAL/PLATELET
Abs Immature Granulocytes: 0.04 10*3/uL (ref 0.00–0.07)
Basophils Absolute: 0 10*3/uL (ref 0.0–0.1)
Basophils Relative: 0 %
Eosinophils Absolute: 0 10*3/uL (ref 0.0–0.5)
Eosinophils Relative: 0 %
HCT: 40.5 % (ref 39.0–52.0)
Hemoglobin: 12.3 g/dL — ABNORMAL LOW (ref 13.0–17.0)
Immature Granulocytes: 2 %
Lymphocytes Relative: 44 %
Lymphs Abs: 1.1 10*3/uL (ref 0.7–4.0)
MCH: 29.4 pg (ref 26.0–34.0)
MCHC: 30.4 g/dL (ref 30.0–36.0)
MCV: 96.9 fL (ref 80.0–100.0)
Monocytes Absolute: 0.3 10*3/uL (ref 0.1–1.0)
Monocytes Relative: 13 %
Neutro Abs: 1 10*3/uL — ABNORMAL LOW (ref 1.7–7.7)
Neutrophils Relative %: 41 %
Platelets: 40 10*3/uL — ABNORMAL LOW (ref 150–400)
RBC: 4.18 MIL/uL — ABNORMAL LOW (ref 4.22–5.81)
RDW: 22 % — ABNORMAL HIGH (ref 11.5–15.5)
WBC: 2.6 10*3/uL — ABNORMAL LOW (ref 4.0–10.5)
nRBC: 0 % (ref 0.0–0.2)

## 2020-11-02 LAB — LACTIC ACID, PLASMA
Lactic Acid, Venous: 2.1 mmol/L (ref 0.5–1.9)
Lactic Acid, Venous: 1.8 mmol/L (ref 0.5–1.9)

## 2020-11-02 LAB — URINALYSIS, ROUTINE W REFLEX MICROSCOPIC
Bilirubin Urine: NEGATIVE
Glucose, UA: NEGATIVE mg/dL
Ketones, ur: NEGATIVE mg/dL
Leukocytes,Ua: NEGATIVE
Nitrite: NEGATIVE
Protein, ur: NEGATIVE mg/dL
Specific Gravity, Urine: 1.02 (ref 1.005–1.030)
pH: 5.5 (ref 5.0–8.0)

## 2020-11-02 LAB — RESP PANEL BY RT-PCR (FLU A&B, COVID) ARPGX2
Influenza A by PCR: NEGATIVE
Influenza B by PCR: NEGATIVE
SARS Coronavirus 2 by RT PCR: NEGATIVE

## 2020-11-02 LAB — GLUCOSE, CAPILLARY
Glucose-Capillary: 63 mg/dL — ABNORMAL LOW (ref 70–99)
Glucose-Capillary: 67 mg/dL — ABNORMAL LOW (ref 70–99)
Glucose-Capillary: 84 mg/dL (ref 70–99)
Glucose-Capillary: 97 mg/dL (ref 70–99)

## 2020-11-02 LAB — URINALYSIS, MICROSCOPIC (REFLEX)

## 2020-11-02 LAB — MRSA NEXT GEN BY PCR, NASAL: MRSA by PCR Next Gen: NOT DETECTED

## 2020-11-02 LAB — HEMOGLOBIN A1C
Hgb A1c MFr Bld: 4.6 % — ABNORMAL LOW (ref 4.8–5.6)
Mean Plasma Glucose: 85.32 mg/dL

## 2020-11-02 LAB — AMMONIA: Ammonia: 16 umol/L (ref 9–35)

## 2020-11-02 MED ORDER — SODIUM CHLORIDE 0.9 % IV SOLN
250.0000 mL | INTRAVENOUS | Status: DC
  Administered 2020-11-06: 250 mL via INTRAVENOUS

## 2020-11-02 MED ORDER — PIPERACILLIN-TAZOBACTAM 3.375 G IVPB
3.3750 g | Freq: Three times a day (TID) | INTRAVENOUS | Status: DC
  Administered 2020-11-02 – 2020-11-04 (×5): 3.375 g via INTRAVENOUS
  Filled 2020-11-02 (×6): qty 50

## 2020-11-02 MED ORDER — HEPARIN SODIUM (PORCINE) 5000 UNIT/ML IJ SOLN
5000.0000 [IU] | Freq: Three times a day (TID) | INTRAMUSCULAR | Status: DC
  Administered 2020-11-02 – 2020-11-03 (×2): 5000 [IU] via SUBCUTANEOUS
  Filled 2020-11-02 (×2): qty 1

## 2020-11-02 MED ORDER — INSULIN ASPART 100 UNIT/ML IJ SOLN: 0.0000 [IU] | INTRAMUSCULAR | Status: DC

## 2020-11-02 MED ORDER — SODIUM CHLORIDE 0.9 % IV SOLN
2.0000 g | Freq: Once | INTRAVENOUS | Status: AC
  Administered 2020-11-02: 2 g via INTRAVENOUS
  Filled 2020-11-02: qty 2

## 2020-11-02 MED ORDER — VANCOMYCIN HCL 1500 MG/300ML IV SOLN
1500.0000 mg | Freq: Once | INTRAVENOUS | Status: AC
  Administered 2020-11-02: 1500 mg via INTRAVENOUS
  Filled 2020-11-02: qty 300

## 2020-11-02 MED ORDER — SODIUM CHLORIDE 0.9 % IV SOLN: 2.0000 g | Freq: Three times a day (TID) | INTRAVENOUS | Status: DC

## 2020-11-02 MED ORDER — DEXTROSE 50 % IV SOLN
12.5000 g | INTRAVENOUS | Status: AC
  Administered 2020-11-02: 12.5 g via INTRAVENOUS

## 2020-11-02 MED ORDER — ACETAMINOPHEN 650 MG RE SUPP
650.0000 mg | Freq: Once | RECTAL | Status: AC
  Administered 2020-11-02: 650 mg via RECTAL
  Filled 2020-11-02: qty 1

## 2020-11-02 MED ORDER — DEXTROSE 50 % IV SOLN
INTRAVENOUS | Status: AC
  Administered 2020-11-02: 50 mL
  Filled 2020-11-02: qty 50

## 2020-11-02 MED ORDER — VANCOMYCIN HCL 1500 MG/300ML IV SOLN
1500.0000 mg | INTRAVENOUS | Status: DC
  Filled 2020-11-02: qty 300

## 2020-11-02 MED ORDER — LACTATED RINGERS IV BOLUS (SEPSIS)
1000.0000 mL | Freq: Once | INTRAVENOUS | Status: AC
Start: 2020-11-02 — End: 2020-11-02
  Administered 2020-11-02: 1000 mL via INTRAVENOUS

## 2020-11-02 MED ORDER — LACTATED RINGERS IV BOLUS
1000.0000 mL | Freq: Once | INTRAVENOUS | Status: AC
  Administered 2020-11-02: 1000 mL via INTRAVENOUS

## 2020-11-02 MED ORDER — POTASSIUM CHLORIDE 10 MEQ/100ML IV SOLN
10.0000 meq | INTRAVENOUS | Status: AC
Start: 2020-11-02 — End: 2020-11-03
  Administered 2020-11-02 (×2): 10 meq via INTRAVENOUS
  Filled 2020-11-02 (×2): qty 100

## 2020-11-02 MED ORDER — MIDODRINE HCL 5 MG PO TABS
5.0000 mg | ORAL_TABLET | Freq: Three times a day (TID) | ORAL | Status: DC
  Filled 2020-11-02: qty 1

## 2020-11-02 MED ORDER — LACTATED RINGERS IV SOLN: INTRAVENOUS | Status: DC

## 2020-11-02 MED ORDER — DEXTROSE IN LACTATED RINGERS 5 % IV SOLN: INTRAVENOUS | Status: DC

## 2020-11-02 MED ORDER — METRONIDAZOLE 500 MG/100ML IV SOLN
500.0000 mg | Freq: Once | INTRAVENOUS | Status: AC
  Administered 2020-11-02: 500 mg via INTRAVENOUS
  Filled 2020-11-02: qty 100

## 2020-11-02 MED ORDER — LACTATED RINGERS IV BOLUS (SEPSIS)
500.0000 mL | Freq: Once | INTRAVENOUS | Status: AC
  Administered 2020-11-02: 500 mL via INTRAVENOUS

## 2020-11-02 MED ORDER — IOHEXOL 300 MG/ML  SOLN
100.0000 mL | Freq: Once | INTRAMUSCULAR | Status: AC | PRN
  Administered 2020-11-02: 100 mL via INTRAVENOUS

## 2020-11-02 MED ORDER — ACYCLOVIR 400 MG PO TABS
400.0000 mg | ORAL_TABLET | Freq: Two times a day (BID) | ORAL | Status: DC
  Administered 2020-11-03 – 2020-11-06 (×7): 400 mg via ORAL
  Filled 2020-11-02 (×9): qty 1

## 2020-11-02 MED ORDER — LACTATED RINGERS IV BOLUS (SEPSIS)
1000.0000 mL | Freq: Once | INTRAVENOUS | Status: AC
  Administered 2020-11-02: 1000 mL via INTRAVENOUS

## 2020-11-02 MED ORDER — NOREPINEPHRINE 4 MG/250ML-% IV SOLN
2.0000 ug/min | INTRAVENOUS | Status: DC
  Administered 2020-11-02 – 2020-11-03 (×3): 2 ug/min via INTRAVENOUS
  Filled 2020-11-02 (×2): qty 250

## 2020-11-02 MED ORDER — POTASSIUM CHLORIDE 10 MEQ/100ML IV SOLN
10.0000 meq | INTRAVENOUS | Status: AC
  Administered 2020-11-02: 10 meq via INTRAVENOUS
  Filled 2020-11-02: qty 100

## 2020-11-02 MED ORDER — DEXTROSE 50 % IV SOLN
INTRAVENOUS | Status: AC
  Filled 2020-11-02: qty 50

## 2020-11-02 NOTE — ED Notes (Signed)
Pt to CT

## 2020-11-02 NOTE — Progress Notes (Signed)
Pt taken off NRB and placed on 10L HFNC.

## 2020-11-02 NOTE — H&P (Signed)
NAME:  Ryan Copeland, MRN:  [id number], DOB:  May 07, 1950, LOS: 0 ADMISSION DATE:  11/02/2020, CONSULTATION DATE:  11/02/2020 REFERRING MD:  Dr. 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   He is followed in our office by Dr. Shearon Stalls, has been scheduled for ENB with Dr. Valeta Harms for enlarged mediastinal lymph nodes but has been cancelled x 2 due to hospitalizations.  He was recently hospitalized at [facility] in August for urosepsis and pneumonia complicated by hemoperitoneum and splenic laceration secondary to fall.  Followed back up and was started on doxycycline and lasix for lower extremity cellulitis on 9/15.    Today, his wife called EMS after he was only responsive to pain.  Reported he was ambulatory yesterday and seemingly at his baseline but thought something was off.  No recent falls.  He has not taken any of his home meds today including his midodrine.  Patient denies any choking episodes or difficulty swallowing but endorses poor PO intake.   In ER, noted to be febrile up to 102, tachycardic, tachypneic, hypoxic into the 80's, and hypotensive.  He received 3.5 L in ER and started on vancomycin and cefepime.  Significant labs noted for WBC 2.6, platelets 40 (previously 69), lactic acid 2.1-> 1.8, K 3.4, AST 45, flu and covid neg, UA with large Hgb and rare bacteria, CXR read as improving pneumonia with trace left pleural effusion since prior films, CT chest/abd/ pelvis obtained which noted LLL pna, and new patchy GGO and airspace opacities in the RUL and RLL, decreased small bilateral pleural effusions, and unchanged mediastinal, left hilar, retroperitoneal and central mesenteric lymphadenopathy, unchanged marked splenomegaly, and unchanged small  hyperdense right renal lesion.  CTH negative.  After 3.5L, he is still hypotensive with plans to restart home midodrine.  He is now more alert and on 2L [place] with good saturations.  Wife remains at bedside.  PCCM called for admit.   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 rate 20, SpO2 97 %.        Intake/Output Summary (Last 24 hours) at 11/02/2020 1628 Last data filed at 11/02/2020 1202 Gross per 24 hour  Intake 1500 ml  Output --  Net 1500 ml   There were no vitals filed for this visit.  Examination: General:  Chronically ill appearing male lying in bed in NAD HEENT: MM pink/dry  Neuro:  Awake, answers questions appropriately, f/c,  MAE  CV:  rr, SR/ ST, no murmur PULM:  non labored, clear anteriorly, bibasilar rales L > R, congested NP cough.  Was 100% on 2L [place].  Taken off and at 96-98% on room air GI: round, soft, +bs, NT, supra-pubic catheter- site clean, cyu Extremities: warm/dry, no LE edema but noted chronic venous stasis changes- no warmth or  drainage noted.  Chronic? Bruising to bilateral arms  Resolved Hospital Problem list    Assessment & Plan:   Sepsis secondary to pneumonia LLL PNA with suspected involvement in RUL and RLL Centrilobular emphysema   - admit to ICU  - seems still fluid responsive/ dry clinically, doing ok on room air.  Additional 1L LR now then at 150 ml/hr.  May need more fluids +/- peripheral NE  for MAP goal > 65 - continue vancomycin and zosyn.  Narrow as able - check cortisol, rule out AI component - follow culture data  - NPO for now, pending SLP - supplemental O2 prn  - trend BMET/ strict IOs   Acute encephalopathy - likely related to sepsis.  CTH neg.  Ammonia normal.  Improving mental status already in ER.  - serial neuro exams   Chronic hypotension - holding midodrine while NPO pending SLP    Hypokalemia, K 3.4 - KCL x 3 runs - check Mag - BMET in am    DMT2 - controlled, monitor CBG q 4hrs.  Add SSI if > 180  CLL  Hypogammaglobulinemia - due for IVIG this Friday/ 9/23.  Will likely need to post-pone this.  - hold calquence - may need to reach out to oncology    Mediastinal, left hilar, retroperitoneal and central mesenteric lymphadenopathy - has had ENB postponed x2 given recent hospitalizations.  Will reach out to let Dr. Valeta Harms, ? May want to proceed before discharge  Best Practice (right click and "Reselect all SmartList Selections" daily)   Diet/type: NPO DVT prophylaxis: prophylactic heparin  GI prophylaxis: N/A Lines: N/A Foley:  Yes, and it is still needed- chronic suprapubic  Code Status:  full code Last date of multidisciplinary goals of care discussion [patient and wife updated at bedside]  Labs   CBC: Recent Labs  Lab 10/27/20 0844 11/02/20 1212  WBC 45.2* 2.6*  NEUTROABS 2.6 1.0*  HGB 10.8* 12.3*  HCT 35.3* 40.5  MCV 93.6 96.9  PLT 69* 40*    Basic Metabolic Panel: Recent Labs  Lab 10/27/20 0844 11/02/20 1212  NA 139 140  K 4.5 3.4*  CL 102 108  CO2 28 22  GLUCOSE 116* 86  BUN 12 23  CREATININE 1.05 1.22  CALCIUM 8.7* 7.8*   GFR: Estimated Creatinine Clearance: 62.7 mL/min (by C-G formula based on SCr of 1.22 mg/dL). Recent Labs  Lab 10/27/20 0844 11/02/20 1212  WBC 45.2* 2.6*  LATICACIDVEN  --  2.1*    Liver Function Tests: Recent Labs  Lab 10/27/20 0844 11/02/20 1212  AST 26 45*  ALT 18 23   ALKPHOS 95 67  BILITOT 0.5 0.7  PROT 5.9* 4.9*  ALBUMIN 3.3* 2.8*   No results for input(s): LIPASE, AMYLASE in the last 168 hours. No results for input(s): AMMONIA in the last 168 hours.  ABG    Component Value Date/Time   PHART 7.541 (H) 12/15/2019 1005   PCO2ART 29.8 (L) 12/15/2019 1005   PO2ART 78 (L) 12/15/2019 1005   HCO3 23.3 07/18/2020 0148   TCO2 24 07/18/2020 0148   ACIDBASEDEF 1.0 07/18/2020 0148   O2SAT 48.0 07/18/2020 0148     Coagulation Profile: No results for input(s): INR, PROTIME in the last 168 hours.  Cardiac Enzymes: No results for input(s): CKTOTAL, CKMB, CKMBINDEX, TROPONINI in the last 168 hours.  HbA1C: Hgb A1c MFr Bld  Date/Time Value Ref Range Status  02/15/2020 05:03 AM 4.5 (L) 4.8 - 5.6 % Final    Comment:    (  NOTE) Pre diabetes:          5.7%-6.4%  Diabetes:              >6.4%  Glycemic control for   <7.0% adults with diabetes   11/28/2019 07:22 PM 5.1 4.8 - 5.6 % Final    Comment:    (NOTE)         Prediabetes: 5.7 - 6.4         Diabetes: >6.4         Glycemic control for adults with diabetes: <7.0     CBG: No results for input(s): GLUCAP in the last 168 hours.  Review of Systems:   As per HPI otherwise negative.    Past Medical History:  He,  has a past medical history of Anxiety (01/30/2014), Back injury, CAD (coronary artery disease), CLL (chronic lymphocytic leukemia) ([place]) (03/18/2011), COPD (chronic obstructive pulmonary disease) ([place]), DM type 2 (diabetes mellitus, type 2) ([place]), ECRB (extensor carpi radialis brevis) tenosynovitis, Foley catheter in place (11/2019), GERD (gastroesophageal reflux disease), History of blood transfusion (2015), Hyperlipidemia, Insomnia, Long-term current use of intravenous immunoglobulin (IVIG), MI, acute, non ST segment elevation ([place]) (06/28/2009), Neuromuscular disorder ([place]), Pneumonia (11/2019), PVD (peripheral vascular disease) ([place]), Thrombocytopenia ([place]), and Tobacco abuse.    Surgical History:   Past Surgical History:  Procedure Laterality Date   Cantril N/A 09/26/2014   Procedure: Left Heart Cath and Coronary Angiography;  Surgeon: Peter M Martinique, MD;  Location: [place] [facility];  Service: Cardiovascular;  Laterality: N/A;   carpel tunnel release Left 04-1989   carpel tunnel release  Right 01-1989   CHOLECYSTECTOMY  2007   [place]   CORONARY STENT PLACEMENT  06/2009   stent to lad   CYSTOSCOPY N/A 04/20/2020   Procedure: [place];  Surgeon: Janith Lima, MD;  Location: [facility];  Service: Urology;  Laterality: N/A;  ONLY NEEDS 30 MIN   femoral stents  09/2014   iliac stent   IR CV LINE INJECTION  08/18/2017   IR CV LINE INJECTION  09/01/2017   IR CV LINE INJECTION  02/02/2018   LATERAL EPICONDYLE RELEASE Left 02/12/2014   Procedure: LEFT ELBOW DEBRIDEMENT WITH TENDON REPAIR ;  Surgeon: Lorn Junes, MD;  Location: [place];  Service: Orthopedics;  Laterality: Left;   LEFT CAI STENT/PTA AND POPLITEAL ARTERY/TIBIAL THROMBECTOMY      LEFT HEART CATHETERIZATION WITH CORONARY ANGIOGRAM N/A 08/26/2011   Procedure: LEFT HEART CATHETERIZATION WITH CORONARY ANGIOGRAM;  Surgeon: Peter M Martinique, MD;  Location: [facility];  Service: Cardiovascular;  Laterality: N/A;   PERIPHERAL VASCULAR CATHETERIZATION N/A 01/01/2015   Procedure: Abdominal Aortogram;  Surgeon: Conrad Corazon, MD;  Location: [place] [facility];  Service: Cardiovascular;  Laterality: N/A;   port a cath insertion  2014   right    TARSAL TUNNEL RELEASE Bilateral 08-2007     Social History:   reports that he quit smoking about 4 years ago. His smoking use included cigarettes. He has a 44.00 pack-year smoking history. He has never used smokeless tobacco. He reports that he does not currently use alcohol. He reports that he does not use drugs.   Family History:  His family history includes  Cancer in his mother; Heart disease in his father; Heart failure (age of onset: 41) in his father; Lung cancer (age of onset: 66) in his mother.  Allergies Allergies  Allergen Reactions   Immune Globulin Hives and Rash    Pt reports hives/rash on body with IV Privigen. He was changed to IV Gamunex-C and had many infusions without adverse side effects.    Codeine Hives    Pt states he can take a few, more reaction with extended doses.     Home Medications  Prior to Admission medications   Medication Sig Start Date End Date Taking? Authorizing Provider  acalabrutinib (CALQUENCE) 100 MG capsule TAKE 1 CAPSULE BY MOUTH TWICE A DAY Patient taking differently: Take 100 mg by mouth 2 (two) times daily. 03/11/20 03/11/21 Yes [name], MD  acetaminophen (TYLENOL) 325 MG tablet Take 2 tablets (650 mg total) by mouth every 6 (six) hours as needed for moderate pain or fever. 02/20/20  Yes Swayze, Ava, DO  acyclovir (ZOVIRAX) 400 MG tablet TAKE 1 TABLET BY MOUTH TWICE A DAY Patient taking differently: Take 400 mg by mouth 2 (two) times daily. 06/29/20  Yes Gorsuch, Ni, MD  allopurinol (ZYLOPRIM) 300 MG tablet Take 1 tablet (300 mg total) by mouth daily. 09/08/20  Yes Gorsuch, Ni, MD  ALPRAZolam Duanne Moron) 0.5 MG tablet Take 0.5 mg by mouth at bedtime as needed for anxiety. 09/08/20  Yes [provider]  b complex vitamins tablet Take 1 tablet by mouth daily.    Yes [provider]  benzonatate (TESSALON) 100 MG capsule TAKE TWO CAPSULES BY MOUTH THREE TIMES A DAY AS NEEDED Patient taking differently: Take 100 mg by mouth 3 (three) times daily as needed for cough. 06/24/20  Yes Spero Geralds, MD  Cholecalciferol 25 MCG (1000 UT) tablet Take 1,000 Units by mouth daily.    Yes [provider]  diphenoxylate-atropine (LOMOTIL) 2.5-0.025 MG tablet TAKE 1 TABLET BY MOUTH 4 (FOUR) TIMES DAILY AS NEEDED FOR DIARRHEA OR LOOSE STOOLS. 10/29/20  Yes Gorsuch, Ni, MD  doxycycline (VIBRAMYCIN)  100 MG capsule Take 100 mg by mouth 2 (two) times daily. 10/29/20  Yes [provider]  famotidine (PEPCID) 20 MG tablet TAKE 1 TABLET BY MOUTH TWICE A DAY Patient taking differently: Take 20 mg by mouth daily as needed for heartburn. 02/18/20  Yes Gorsuch, Ernst Spell, MD  HYDROcodone-acetaminophen (NORCO) 10-325 MG tablet Take 1 tablet by mouth every 6 (six) hours as needed for moderate pain.   Yes [provider]  loperamide (IMODIUM) 2 MG capsule Take 2 capsules (4 mg total) by mouth 4 (four) times daily. Patient taking differently: Take 4 mg by mouth 4 (four) times daily as needed for diarrhea or loose stools. 07/24/20  Yes Gorsuch, Ernst Spell, MD  meclizine (ANTIVERT) 12.5 MG tablet Take 12.5 mg by mouth 3 (three) times daily as needed for dizziness.   Yes [provider]  metFORMIN (GLUCOPHAGE-XR) 500 MG 24 hr tablet Take 500 mg by mouth every evening.   Yes [provider]  midodrine (PROAMATINE) 5 MG tablet Take 1.5 tablets (7.5 mg total) by mouth 3 (three) times daily with meals. 07/08/20  Yes Pemberton, Greer Ee, MD  montelukast (SINGULAIR) 10 MG tablet TAKE 1 TABLET BY MOUTH EVERY DAY Patient taking differently: Take 10 mg by mouth at bedtime. 07/14/20  Yes Spero Geralds, MD  pregabalin (LYRICA) 200 MG capsule TAKE 1 CAPSULE (200 MG TOTAL) BY MOUTH 3 (THREE) TIMES DAILY. 09/02/19  Yes Suzzanne Cloud, NP  rosuvastatin (CRESTOR) 5 MG tablet Take 1 tablet (5 mg total) by mouth daily. 07/09/20  Yes Freada Bergeron, MD  zolpidem (  AMBIEN) 10 MG tablet Take 10 mg by mouth at bedtime. 03/09/20  Yes [provider]  lidocaine-prilocaine (EMLA) cream Apply 1 application topically as needed (prior chemo/IBIG). Patient not taking: No sig reported 09/15/20   [name], MD  nitroGLYCERIN (NITROSTAT) 0.4 MG SL tablet PLACE 1 TABLET UNDER THE TONGUE EVERY 5 MINUTES X 3 DOSES AS NEEDED FOR CHEST PAIN *MAX 3 DOSES* Patient not taking: No sig reported 08/18/20   Freada Bergeron, MD  sildenafil (VIAGRA) 100 MG tablet Take 0.5 tablets (50 mg total) by mouth as needed for erectile dysfunction (30 mintues prior to sexual intercourse). Patient not taking: No sig reported 04/08/20   Burtis Junes, NP  tamsulosin (FLOMAX) 0.4 MG CAPS capsule Take 1 capsule (0.4 mg total) by mouth daily. Patient not taking: No sig reported 12/06/19   Charlynne Cousins, MD     Critical care time: 53 min     Kennieth Rad, ACNP [facility] 11/02/2020, 5:53 PM  See Amion for pager If no response to pager, please call PCCM consult pager After 7:00 pm call Elink

## 2020-11-02 NOTE — ED Provider Notes (Signed)
[name]   CSN: [id number] Arrival date & time: 11/02/20  1159     History Chief Complaint  Patient presents with   Altered Mental Status    Ryan Copeland is a 70 y.o. male with past medical history significant for CAD, CLL, COPD, diabetes, chronic catheter, chronic  thrombocytopenia, recent discharge for new, UTI, fall with hemoperitoneum, liver lack presents for evaluation of altered mental status and sepsis.  Wife states this is consistent with prior urinary tract infections.  When wife found patient was only alert pain time.  Has not taken his medications today.  Prior to 2 days ago patient was doing well, intermittently walking without his walker, had normal mentation.  Started 1 week ago on p.o. antibiotics as well as restarting his Lasix for lower extremity edema.  Level 5 Caveat- AMS  HPI     Past Medical History:  Diagnosis Date   Anxiety 01/30/2014   Back injury    lower disc   CAD (coronary artery disease)    CLL (chronic lymphocytic leukemia) ([place]) 03/18/2011   COPD (chronic obstructive pulmonary disease) ([place])    DM type 2 (diabetes mellitus, type 2) (HCC)    ECRB (extensor carpi radialis brevis) tenosynovitis    Foley catheter in place 11/2019   last changed 04-14-2020   GERD (gastroesophageal reflux disease)    takes Nexium if needed   History of blood transfusion 2015   Hyperlipidemia    Insomnia    Long-term current use of intravenous immunoglobulin (IVIG)    q month ivig   MI, acute, non ST segment elevation ([place]) 06/28/2009   with stenting of the LAD   Neuromuscular disorder ([place])    peripheral neuropathy both all toes   Pneumonia 11/2019   PVD (peripheral vascular disease) ([place])    Thrombocytopenia ([place])    Tobacco abuse     Patient Active Problem List   Diagnosis Date Noted   Bilateral leg edema 10/27/2020   Physical debility 10/27/2020   Lung nodule 09/23/2020   Non-small cell carcinoma  of lung, left ([place]) 09/18/2020   Head and neck lymphadenopathy 81/02/7508   Complicated UTI (urinary tract infection) 07/18/2020   Gum lesion 05/29/2020   Acute lower UTI 02/16/2020   Bladder pain 02/16/2020   Chronic hypotension 02/16/2020   Vitamin B12 deficiency 01/01/2020   Hypotension due to drugs 01/01/2020   Protein-calorie malnutrition, moderate ([place]) 01/01/2020   Septic shock ([place]) 12/15/2019   Community acquired bacterial pneumonia 11/28/2019   Cerebral vascular disease 11/25/2019   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   Other fatigue 10/25/2019   Deficiency anemia 10/25/2019   Dizziness 10/22/2019   Gait abnormality 10/22/2019   Hx of nonmelanoma skin cancer 03/15/2019   Essential hypertension 04/24/2018   Influenza A 04/17/2018   Sepsis ([place]) 04/17/2018   Chronic diastolic CHF (congestive heart failure) ([place]) 04/17/2018   Skin rash 08/07/2017   Port-A-Cath in place 08/04/2017   Chronic apical periodontitis 04/26/2017   Retained dental roots 04/26/2017   Dental caries 04/26/2017   Chronic periodontitis 04/26/2017   Loose, teeth 04/26/2017   Peripheral polyneuropathy 04/14/2017   COPD mixed type ([place]) 02/23/2017   Chronic sinusitis 01/27/2017   Splenomegaly, congestive, chronic 12/29/2016   Diarrhea 12/01/2016   Poor dentition 12/01/2016   Benign mole 12/01/2016   Pancytopenia, acquired ([place]) 11/03/2016   Odynophagia 01/15/2016   Polycythemia, secondary 06/26/2015   Quality of life palliative care encounter 06/26/2015  Atherosclerosis of extremity with intermittent claudication ([place]) 01/23/2015   Lateral epicondylitis of left elbow    ECRB (extensor carpi radialis brevis) tenosynovitis    Anxiety 01/30/2014   Preoperative clearance 01/30/2014   Thrombocytopenia ([place]) 01/30/2014   Diabetes mellitus without complication ([place]) 87/56/4332   Hypogammaglobulinemia, acquired ([place]) 12/26/2012   Hereditary and idiopathic  peripheral neuropathy 08/20/2012   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[place]) 08/20/2012   CAD (coronary artery disease) 07/29/2011   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   MI, acute, non ST segment elevation (HCC)    Hyperlipidemia    PVD (peripheral vascular disease) ([place])    GERD 09/27/2008   SHOULDER STRAIN, RIGHT 09/27/2008    Past Surgical History:  Procedure Laterality Date   [place] N/A 09/26/2014   Procedure: Left Heart Cath and Coronary Angiography;  Surgeon: Peter M Martinique, MD;  Location: [facility];  Service: Cardiovascular;  Laterality: N/A;   carpel tunnel release Left 04-1989   carpel tunnel release  Right 01-1989   CHOLECYSTECTOMY  2007   [place]   CORONARY STENT PLACEMENT  06/2009   stent to lad   CYSTOSCOPY N/A 04/20/2020   Procedure: [place];  Surgeon: Janith Lima, MD;  Location: [facility];  Service: Urology;  Laterality: N/A;  ONLY NEEDS 30 MIN   femoral stents  09/2014   iliac stent   IR CV LINE INJECTION  08/18/2017   IR CV LINE INJECTION  09/01/2017   IR CV LINE INJECTION  02/02/2018   LATERAL EPICONDYLE RELEASE Left 02/12/2014   Procedure: LEFT ELBOW DEBRIDEMENT WITH TENDON REPAIR ;  Surgeon: Lorn Junes, MD;  Location: [facility];  Service: Orthopedics;  Laterality: Left;   LEFT CAI STENT/PTA AND POPLITEAL ARTERY/TIBIAL THROMBECTOMY      LEFT HEART CATHETERIZATION WITH CORONARY ANGIOGRAM N/A 08/26/2011   Procedure: LEFT HEART CATHETERIZATION WITH CORONARY ANGIOGRAM;  Surgeon: Peter M Martinique, MD;  Location: [facility];  Service: Cardiovascular;  Laterality: N/A;   PERIPHERAL VASCULAR CATHETERIZATION N/A 01/01/2015   Procedure: Abdominal Aortogram;  Surgeon: Conrad Crane, MD;  Location: [facility];  Service: Cardiovascular;  Laterality: N/A;   port a cath insertion  2014   right    TARSAL TUNNEL  RELEASE Bilateral 08-2007       Family History  Problem Relation Age of Onset   Lung cancer Mother 67   Cancer Mother        lung   Heart failure Father 34   Heart disease Father     Social History   Tobacco Use   Smoking status: Former    Packs/day: 1.00    Years: 44.00    Pack years: 44.00    Types: Cigarettes    Quit date: 04/23/2016    Years since quitting: 4.5   Smokeless tobacco: Never  Vaping Use   Vaping Use: Never used  Substance Use Topics   Alcohol use: Not Currently    Alcohol/week: 0.0 standard drinks   Drug use: No    Home Medications Prior to Admission medications   Medication Sig Start Date End Date Taking? Authorizing Provider  acalabrutinib (CALQUENCE) 100 MG capsule TAKE 1 CAPSULE BY MOUTH TWICE A DAY Patient taking differently: Take 100 mg by mouth 2 (two) times daily. 03/11/20 03/11/21 Yes [name], MD  acetaminophen (TYLENOL) 325 MG tablet Take  2 tablets (650 mg total) by mouth every 6 (six) hours as needed for moderate pain or fever. 02/20/20  Yes Swayze, Ava, DO  acyclovir (ZOVIRAX) 400 MG tablet TAKE 1 TABLET BY MOUTH TWICE A DAY Patient taking differently: Take 400 mg by mouth 2 (two) times daily. 06/29/20  Yes Gorsuch, Ni, MD  allopurinol (ZYLOPRIM) 300 MG tablet Take 1 tablet (300 mg total) by mouth daily. 09/08/20  Yes Gorsuch, Ni, MD  ALPRAZolam Duanne Moron) 0.5 MG tablet Take 0.5 mg by mouth at bedtime as needed for anxiety. 09/08/20  Yes [provider]  b complex vitamins tablet Take 1 tablet by mouth daily.    Yes [provider]  benzonatate (TESSALON) 100 MG capsule TAKE TWO CAPSULES BY MOUTH THREE TIMES A DAY AS NEEDED Patient taking differently: Take 100 mg by mouth 3 (three) times daily as needed for cough. 06/24/20  Yes Spero Geralds, MD  Cholecalciferol 25 MCG (1000 UT) tablet Take 1,000 Units by mouth daily.    Yes [provider]  diphenoxylate-atropine (LOMOTIL) 2.5-0.025 MG tablet TAKE 1 TABLET BY MOUTH 4  (FOUR) TIMES DAILY AS NEEDED FOR DIARRHEA OR LOOSE STOOLS. 10/29/20  Yes Gorsuch, Ni, MD  doxycycline (VIBRAMYCIN) 100 MG capsule Take 100 mg by mouth 2 (two) times daily. 10/29/20  Yes [provider]  famotidine (PEPCID) 20 MG tablet TAKE 1 TABLET BY MOUTH TWICE A DAY Patient taking differently: Take 20 mg by mouth daily as needed for heartburn. 02/18/20  Yes Gorsuch, Ernst Spell, MD  HYDROcodone-acetaminophen (NORCO) 10-325 MG tablet Take 1 tablet by mouth every 6 (six) hours as needed for moderate pain.   Yes [provider]  loperamide (IMODIUM) 2 MG capsule Take 2 capsules (4 mg total) by mouth 4 (four) times daily. Patient taking differently: Take 4 mg by mouth 4 (four) times daily as needed for diarrhea or loose stools. 07/24/20  Yes Gorsuch, Ernst Spell, MD  meclizine (ANTIVERT) 12.5 MG tablet Take 12.5 mg by mouth 3 (three) times daily as needed for dizziness.   Yes [provider]  metFORMIN (GLUCOPHAGE-XR) 500 MG 24 hr tablet Take 500 mg by mouth every evening.   Yes [provider]  midodrine (PROAMATINE) 5 MG tablet Take 1.5 tablets (7.5 mg total) by mouth 3 (three) times daily with meals. 07/08/20  Yes Pemberton, Greer Ee, MD  montelukast (SINGULAIR) 10 MG tablet TAKE 1 TABLET BY MOUTH EVERY DAY Patient taking differently: Take 10 mg by mouth at bedtime. 07/14/20  Yes Spero Geralds, MD  pregabalin (LYRICA) 200 MG capsule TAKE 1 CAPSULE (200 MG TOTAL) BY MOUTH 3 (THREE) TIMES DAILY. 09/02/19  Yes Suzzanne Cloud, NP  rosuvastatin (CRESTOR) 5 MG tablet Take 1 tablet (5 mg total) by mouth daily. 07/09/20  Yes Freada Bergeron, MD  zolpidem (AMBIEN) 10 MG tablet Take 10 mg by mouth at bedtime. 03/09/20  Yes [provider]  lidocaine-prilocaine (EMLA) cream Apply 1 application topically as needed (prior chemo/IBIG). Patient not taking: No sig reported 09/15/20   [name], MD  nitroGLYCERIN (NITROSTAT) 0.4 MG SL tablet PLACE 1 TABLET UNDER THE TONGUE EVERY 5 MINUTES  X 3 DOSES AS NEEDED FOR CHEST PAIN *MAX 3 DOSES* Patient not taking: No sig reported 08/18/20   Freada Bergeron, MD  sildenafil (VIAGRA) 100 MG tablet Take 0.5 tablets (50 mg total) by mouth as needed for erectile dysfunction (30 mintues prior to sexual intercourse). Patient not taking: No sig reported 04/08/20   Truitt Merle  C, NP  tamsulosin (FLOMAX) 0.4 MG CAPS capsule Take 1 capsule (0.4 mg total) by mouth daily. Patient not taking: No sig reported 12/06/19   Charlynne Cousins, MD    Allergies    Immune globulin and Codeine  Review of Systems   Review of Systems  Unable to perform ROS: Mental status change  All other systems reviewed and are negative.  Physical Exam Updated Vital Signs BP (!) 79/39   Pulse 100   Temp 97.6 F (36.4 C) (Oral)   Resp 20   SpO2 97%   Physical Exam Vitals and nursing note reviewed.  Constitutional:      General: He is in acute distress.     Appearance: He is well-developed. He is ill-appearing and toxic-appearing. He is not diaphoretic.  HENT:     Head: Normocephalic and atraumatic.     Nose: Nose normal.     Mouth/Throat:     Mouth: Mucous membranes are dry.  Eyes:     Pupils: Pupils are equal, round, and reactive to light.  Cardiovascular:     Rate and Rhythm: Regular rhythm. Tachycardia present.     Pulses: Normal pulses.     Heart sounds: Normal heart sounds.  Pulmonary:     Effort: No respiratory distress.     Breath sounds: Normal breath sounds.     Comments: Course lung sounds Bl Abdominal:     General: Bowel sounds are normal. There is no distension.     Palpations: Abdomen is soft.     Tenderness: There is no abdominal tenderness. There is no right CVA tenderness, left CVA tenderness or guarding.  Musculoskeletal:        General: Normal range of motion.     Cervical back: Normal range of motion and neck supple.     Comments: No bony tenderness.  Moves all 4 extremities without difficulty.  Skin:    General: Skin is  warm and dry.     Comments: Chronic venous stasis skin changes bilateral lower extremities  Neurological:     Mental Status: He is alert.     Comments: Confused, alert to name, date of birth however not place or location Cranial nerves grossly intact Follows commands    ED Results / Procedures / Treatments   Labs (all labs ordered are listed, but only abnormal results are displayed) Labs Reviewed  LACTIC ACID, PLASMA - Abnormal; Notable for the following components:      Result Value   Lactic Acid, Venous 2.1 (*)    All other components within normal limits  COMPREHENSIVE METABOLIC PANEL - Abnormal; Notable for the following components:   Potassium 3.4 (*)    Calcium 7.8 (*)    Total Protein 4.9 (*)    Albumin 2.8 (*)    AST 45 (*)    All other components within normal limits  CBC WITH DIFFERENTIAL/PLATELET - Abnormal; Notable for the following components:   WBC 2.6 (*)    RBC 4.18 (*)    Hemoglobin 12.3 (*)    RDW 22.0 (*)    Platelets 40 (*)    Neutro Abs 1.0 (*)    All other components within normal limits  URINALYSIS, ROUTINE W REFLEX MICROSCOPIC - Abnormal; Notable for the following components:   Hgb urine dipstick LARGE (*)    All other components within normal limits  URINALYSIS, MICROSCOPIC (REFLEX) - Abnormal; Notable for the following components:   Bacteria, UA RARE (*)    All other components within  normal limits  RESP PANEL BY RT-PCR (FLU A&B, COVID) [id number]  CULTURE, BLOOD (ROUTINE X 2)  CULTURE, BLOOD (ROUTINE X 2)  URINE CULTURE  LACTIC ACID, PLASMA  AMMONIA  MISCELLANEOUS GENETIC TEST    EKG None  Radiology CT HEAD WO CONTRAST (5MM)  Result Date: 11/02/2020 CLINICAL DATA:  [place] EXAM: CT HEAD WITHOUT CONTRAST TECHNIQUE: Contiguous axial images were obtained from the base of the skull through the vertex without intravenous contrast. COMPARISON:  10/10/2020 FINDINGS: Brain: There is no acute intracranial hemorrhage, mass effect, or edema.  Gray-white differentiation is preserved. There is no extra-axial fluid collection. Ventricles and sulci are stable in size and configuration. Vascular: There is atherosclerotic calcification at the skull base. Skull: Calvarium is unremarkable. Sinuses/Orbits: Moderate paranasal sinus opacification. Other: None. IMPRESSION: No acute intracranial abnormality Electronically Signed   By: Macy Mis M.D.   On: 11/02/2020 15:47   CT CHEST ABDOMEN PELVIS W CONTRAST  Result Date: 11/02/2020 CLINICAL DATA:  Abdominal pain, hypotension. Previous fall. History of lymphoma. EXAM: CT CHEST, ABDOMEN, AND PELVIS WITH CONTRAST TECHNIQUE: Multidetector CT imaging of the chest, abdomen and pelvis was performed following the standard protocol during bolus administration of intravenous contrast. CONTRAST:  179mL OMNIPAQUE IOHEXOL 300 MG/ML  SOLN COMPARISON:  PET CT 10/02/2020. CT chest abdomen and pelvis 10/10/2020. FINDINGS: CT CHEST FINDINGS Cardiovascular: The heart is enlarged, unchanged. There is no pericardial effusion. Aorta is normal in size. There are atherosclerotic calcifications of the aorta and coronary arteries. Right chest port catheter tip ends in the distal SVC. Mediastinum/Nodes: Visualized thyroid gland is within normal limits. Esophagus is within normal limits. There are nonenlarged and mildly enlarged paratracheal lymph nodes measuring up to 1 cm, unchanged. Enlarged subcarinal lymph node measuring up to 12 mm short axis is unchanged. There are nonenlarged AP window and prevascular lymph nodes which are unchanged. Left hilar lymphadenopathy measuring up to 16 mm is unchanged. Prominent bilateral axillary lymph nodes are unchanged. Lungs/Pleura: There are small bilateral pleural effusions which have decreased. There is new dense consolidation throughout the left lower lobe air bronchograms. There also new minimal patchy ground-glass and airspace opacities in the right upper lobe and minimally in the right  lower lobe. There is atelectasis in the lingula. There is a stable left upper lobe nodule measuring 1 cm image 5/84. There is no pneumothorax. There are few nonspecific small cysts in the lungs, unchanged. Trachea and central airways appear patent. Musculoskeletal: No chest wall mass or suspicious bone lesions identified. CT ABDOMEN PELVIS FINDINGS Hepatobiliary: No focal liver abnormality is seen. Status post cholecystectomy. No biliary dilatation. Pancreas: Unremarkable. No pancreatic ductal dilatation or surrounding inflammatory changes. Spleen: Enlarged measuring up to 19 cm, unchanged. Adrenals/Urinary Tract: There is a stable exophytic mildly hyperdense lesion in the inferior pole the right kidney which is too small to characterize. The kidneys otherwise appear within normal limits. The bladder is decompressed by suprapubic catheter. Stomach/Bowel: There is no evidence for bowel obstruction or free air. There is a large amount of stool throughout the colon. Stomach is decompressed. The appendix is not seen. Vascular/Lymphatic: Aortic atherosclerosis. Aorta is normal in size. Diffuse retroperitoneal lymphadenopathy is similar to the prior examination. Largest retroperitoneal lymph node on the left measures 2.7 x 2.1 cm image 3/77 similar to the prior examination. Nonenlarged bilateral inguinal lymph nodes are unchanged. Central mesenteric lymphadenopathy is unchanged. Reproductive: The prostate gland is enlarged, unchanged. Other: No abdominal wall hernia or abnormality. No abdominopelvic ascites. Musculoskeletal: No acute or significant  osseous findings. IMPRESSION: 1. New large area of dense consolidation throughout the left lower lobe compatible with pneumonia. 2. New patchy ground-glass and airspace opacities in the right upper lobe and right lower lobe, likely infectious. 3. Small bilateral pleural effusions have decreased in size. 4. Unchanged mediastinal, left hilar, retroperitoneal and central  mesenteric lymphadenopathy. 5. Unchanged marked splenomegaly. 6. Unchanged small hyperdense right renal lesion, indeterminate. Findings may represent a proteinaceous cyst or small solid mass. 7.  Aortic Atherosclerosis (ICD10-I70.0). Electronically Signed   By: Ronney Asters M.D.   On: 11/02/2020 15:55   DG Chest Port 1 View  Result Date: 11/02/2020 CLINICAL DATA:  Possible sepsis. EXAM: PORTABLE CHEST 1 VIEW COMPARISON:  10/19/2020 and CT chest 10/16/2020. FINDINGS: Right IJ PowerPort tip is in the low SVC. Heart is enlarged, stable. Thoracic aorta is calcified. Mild residual interstitial prominence indistinctness. Marked improvement in aeration in the lower lung zones. There may be trace left pleural fluid. IMPRESSION: 1. Improving edema or pneumonia. 2. Trace left pleural effusion. 3. Small pulmonary nodule seen on PET 10/02/2020 are not well appreciated. 4.  Aortic atherosclerosis (ICD10-I70.0). Electronically Signed   By: Lorin Picket M.D.   On: 11/02/2020 13:05    Procedures .Critical Care Performed by: Nettie Elm, PA-C Authorized by: Nettie Elm, PA-C   Critical care provider statement:    Critical care time (minutes):  75   Critical care was necessary to treat or prevent imminent or life-threatening deterioration of the following conditions:  Shock, sepsis and respiratory failure   Critical care was time spent personally by me on the following activities:  Discussions with consultants, evaluation of patient's response to treatment, examination of patient, ordering and performing treatments and interventions, ordering and review of laboratory studies, ordering and review of radiographic studies, pulse oximetry, re-evaluation of patient's condition, obtaining history from patient or surrogate and review of old charts   Medications Ordered in ED Medications  lactated ringers infusion ( Intravenous New Bag/Given 11/02/20 1431)  ceFEPIme (MAXIPIME) 2 g in sodium chloride 0.9 %  100 mL IVPB (has no administration in time range)  0.9 %  sodium chloride infusion (has no administration in time range)  norepinephrine (LEVOPHED) 4mg  in 232mL premix infusion (has no administration in time range)  midodrine (PROAMATINE) tablet 5 mg (has no administration in time range)  lactated ringers bolus 1,000 mL (0 mLs Intravenous Stopped 11/02/20 1400)    And  lactated ringers bolus 1,000 mL (0 mLs Intravenous Stopped 11/02/20 1400)    And  lactated ringers bolus 500 mL (0 mLs Intravenous Stopped 11/02/20 1544)  ceFEPIme (MAXIPIME) 2 g in sodium chloride 0.9 % 100 mL IVPB (0 g Intravenous Stopped 11/02/20 1319)  metroNIDAZOLE (FLAGYL) IVPB 500 mg (0 mg Intravenous Stopped 11/02/20 1407)  vancomycin (VANCOREADY) IVPB 1500 mg/300 mL (0 mg Intravenous Stopped 11/02/20 1546)  acetaminophen (TYLENOL) suppository 650 mg (650 mg Rectal Given 11/02/20 1319)  lactated ringers bolus 1,000 mL (1,000 mLs Intravenous New Bag/Given 11/02/20 1414)  iohexol (OMNIPAQUE) 300 MG/ML solution 100 mL (100 mLs Intravenous Contrast Given 11/02/20 1517)   ED Course  I have reviewed the triage vital signs and the nursing notes.  Pertinent labs & imaging results that were available during my care of the patient were reviewed by me and considered in my medical decision making (see chart for details).  Here for evaluation of altered mental status and fever.  On arrival patient febrile, tachycardic, tachypneic, hypoxic as well as hypotensive.  Code sepsis  called.  Recently treated for pneumonia, UTI as well as cellulitis able to place on room air here in ED.  [facility] admission at [facility].  Apparently had fall there, solid organ laceration, hemoperitoneum. Low suspicion for cardiogenic shock, PE, ACS, dissection as cause of sx today.  Labs personally reviewed and interpreted:  COVID, Flu neg CBC leukopenia at 2.6, hgb stable UA neg for infection Lactic 1.8 DG chest with improved PNA vs edema CT C/A/P  with increased PNA Bl CT head without acute findings EKG without ischemia   Sepsis reassess. Still persistent hypotension despite 30cc/kg bolus. Started on peripheral levophed, also give home midodrine  CONSULT with Brooke with PCCM, Will assess patient at bedside for admission  Patient sepsis likely due to PNA. Suspect metabolic encephalopathy as cause of AMS in setting on infection.  The patient appears reasonably stabilized for admission considering the current resources, flow, and capabilities available in the ED at this time, and I doubt any other [facility] requiring further screening and/or treatment in the ED prior to admission.       MDM Rules/Calculators/A&P                            Final Clinical Impression(s) / ED Diagnoses Final diagnoses:  Fall  Hypotension  Community acquired pneumonia, unspecified laterality  Septic shock [facility])    Rx / DC Orders ED Discharge Orders     None        Axzel Rockhill A, PA-C 11/02/20 1640    Charlesetta Shanks, MD 11/06/20 [id number]

## 2020-11-02 NOTE — Progress Notes (Signed)
[facility] Progress Note Patient Name: Ryan Copeland DOB: 05-27-1950 MRN: [id number]   Date of Service  11/02/2020  HPI/Events of Note  pt who is immunosuppressed in setting of NSCLCa and CLL, admitted with septic shock.  Source likely is a new bilat PNA (although he was just treated for healthcare associated PNA at another facility a month ago)   eICU Interventions  On a low dose levophed and appropriate abx coverage     Intervention Category Evaluation Type: New Patient Evaluation  T[place]en Dome 11/02/2020, 7:39 PM

## 2020-11-02 NOTE — Progress Notes (Signed)
Pharmacy Antibiotic Note  Ryan Copeland is a 70 y.o. male admitted on 11/02/2020 with sepsis.  Pharmacy has been consulted for vancomycin and zosyn dosing. 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  Vancomycin, cefepime, and metronidazole given in ED.  Plan: Vancomycin 1500mg  IV q24h (eAUC 464, Cr used 1.22mg /dL, slightly elevated from few days ago at 1mg /dL) Zosyn 3.375g q8h -Monitor renal function, clinical status, and antibiotic plan -Order vanc levels as necessary  Temp (24hrs), Avg:99.8 F (37.7 C), Min:97.6 F (36.4 C), Max:102 F (38.9 C)  Recent Labs  Lab 10/27/20 0844 11/02/20 1212 11/02/20 1412  WBC 45.2* 2.6*  --   CREATININE 1.05 1.22  --   LATICACIDVEN  --  2.1* 1.8     Estimated Creatinine Clearance: 62.7 mL/min (by C-G formula based on SCr of 1.22 mg/dL).    Allergies  Allergen Reactions   Immune Globulin Hives and Rash    Pt reports hives/rash on body with IV Privigen. He was changed to IV Gamunex-C and had many infusions without adverse side effects.    Codeine Hives    Pt states he can take a few, more reaction with extended doses.    Antimicrobials this admission: Vanc 9/19 >>  Zosyn 9/19 >> Cefepime 9/19 >> 9/19 Metronidazole 9/19 >> 9/19  Dose adjustments this admission: N/A  Microbiology results: 9/19 BCx:  9/19 UCx:   Thank you for allowing pharmacy to be a part of this patient's care.  Lorelei Pont, PharmD, BCPS 11/02/2020 5:25 PM ED Clinical Pharmacist -  [phone]

## 2020-11-02 NOTE — ED Triage Notes (Signed)
Pt brought from by by EMS. Pt's wife found him with AMS only responding to pain. Pt was hospitalized 2 weels ago for pneumonia and finsihed his antibiotixs. HX of UTI, wife reports "this is how he acts right before he gets sepsis"   120 HR  102F  28 RR 98% 3L [place] 87% RA

## 2020-11-02 NOTE — Progress Notes (Signed)
Notified bedside nurse of need to draw repeat lactic acid. 

## 2020-11-02 NOTE — Progress Notes (Signed)
Elink following for code sepsis 

## 2020-11-03 DIAGNOSIS — R6521 Severe sepsis with septic shock: Secondary | ICD-10-CM | POA: Diagnosis not present

## 2020-11-03 DIAGNOSIS — J189 Pneumonia, unspecified organism: Secondary | ICD-10-CM | POA: Diagnosis not present

## 2020-11-03 DIAGNOSIS — A419 Sepsis, unspecified organism: Secondary | ICD-10-CM | POA: Diagnosis not present

## 2020-11-03 LAB — GLUCOSE, CAPILLARY
Glucose-Capillary: 93 mg/dL (ref 70–99)
Glucose-Capillary: 111 mg/dL — ABNORMAL HIGH (ref 70–99)
Glucose-Capillary: 132 mg/dL — ABNORMAL HIGH (ref 70–99)
Glucose-Capillary: 138 mg/dL — ABNORMAL HIGH (ref 70–99)

## 2020-11-03 LAB — CORTISOL-AM, BLOOD: Cortisol - AM: 12.3 ug/dL (ref 6.7–22.6)

## 2020-11-03 LAB — PROTIME-INR
INR: 2 — ABNORMAL HIGH (ref 0.8–1.2)
Prothrombin Time: 22.5 seconds — ABNORMAL HIGH (ref 11.4–15.2)

## 2020-11-03 LAB — COMPREHENSIVE METABOLIC PANEL
ALT: 26 U/L (ref 0–44)
AST: 63 U/L — ABNORMAL HIGH (ref 15–41)
Albumin: 2.5 g/dL — ABNORMAL LOW (ref 3.5–5.0)
Alkaline Phosphatase: 50 U/L (ref 38–126)
Anion gap: 8 (ref 5–15)
BUN: 22 mg/dL (ref 8–23)
CO2: 23 mmol/L (ref 22–32)
Calcium: 8 mg/dL — ABNORMAL LOW (ref 8.9–10.3)
Chloride: 107 mmol/L (ref 98–111)
Creatinine, Ser: 0.85 mg/dL (ref 0.61–1.24)
GFR, Estimated: >60 mL/min (ref >60)
Glucose, Bld: 134 mg/dL — ABNORMAL HIGH (ref 70–99)
Potassium: 3.2 mmol/L — ABNORMAL LOW (ref 3.5–5.1)
Sodium: 138 mmol/L (ref 135–145)
Total Bilirubin: 1.1 mg/dL (ref 0.3–1.2)
Total Protein: 4.7 g/dL — ABNORMAL LOW (ref 6.5–8.1)

## 2020-11-03 LAB — CBC
HCT: 30.7 % — ABNORMAL LOW (ref 39.0–52.0)
Hemoglobin: 9.9 g/dL — ABNORMAL LOW (ref 13.0–17.0)
MCH: 29.8 pg (ref 26.0–34.0)
MCHC: 32.2 g/dL (ref 30.0–36.0)
MCV: 92.5 fL (ref 80.0–100.0)
Platelets: 35 10*3/uL — ABNORMAL LOW (ref 150–400)
RBC: 3.32 MIL/uL — ABNORMAL LOW (ref 4.22–5.81)
RDW: 21.6 % — ABNORMAL HIGH (ref 11.5–15.5)
WBC: 4.8 10*3/uL (ref 4.0–10.5)
nRBC: 0 % (ref 0.0–0.2)

## 2020-11-03 LAB — PHOSPHORUS: Phosphorus: 3 mg/dL (ref 2.5–4.6)

## 2020-11-03 LAB — PROCALCITONIN: Procalcitonin: 31.9 ng/mL

## 2020-11-03 LAB — MAGNESIUM: Magnesium: 1.4 mg/dL — ABNORMAL LOW (ref 1.7–2.4)

## 2020-11-03 LAB — URINE CULTURE: Culture: NO GROWTH

## 2020-11-03 LAB — C DIFFICILE QUICK SCREEN W PCR REFLEX
C Diff antigen: NEGATIVE
C Diff interpretation: NOT DETECTED
C Diff toxin: NEGATIVE

## 2020-11-03 MED ORDER — MIDODRINE HCL 5 MG PO TABS
10.0000 mg | ORAL_TABLET | Freq: Three times a day (TID) | ORAL | Status: DC
  Administered 2020-11-03 – 2020-11-04 (×5): 10 mg via ORAL
  Filled 2020-11-03 (×5): qty 2

## 2020-11-03 MED ORDER — KETOROLAC TROMETHAMINE 15 MG/ML IJ SOLN
15.0000 mg | Freq: Four times a day (QID) | INTRAMUSCULAR | Status: DC | PRN
Start: 2020-11-03 — End: 2020-11-03
  Administered 2020-11-03: 15 mg via INTRAVENOUS
  Filled 2020-11-03 (×2): qty 1

## 2020-11-03 MED ORDER — MAGNESIUM SULFATE 50 % IJ SOLN
6.0000 g | Freq: Once | INTRAVENOUS | Status: AC
  Administered 2020-11-03: 6 g via INTRAVENOUS
  Filled 2020-11-03: qty 12

## 2020-11-03 MED ORDER — PREGABALIN 50 MG PO CAPS
50.0000 mg | ORAL_CAPSULE | Freq: Three times a day (TID) | ORAL | Status: DC
  Administered 2020-11-03 – 2020-11-06 (×9): 50 mg via ORAL
  Filled 2020-11-03 (×9): qty 1

## 2020-11-03 MED ORDER — CHLORHEXIDINE GLUCONATE CLOTH 2 % EX PADS
6.0000 | MEDICATED_PAD | Freq: Every day | CUTANEOUS | Status: DC
  Administered 2020-11-02 – 2020-11-05 (×4): 6 via TOPICAL

## 2020-11-03 MED ORDER — POTASSIUM CHLORIDE 20 MEQ PO PACK
40.0000 meq | PACK | ORAL | Status: AC
  Administered 2020-11-03 (×2): 40 meq via ORAL
  Filled 2020-11-03 (×2): qty 2

## 2020-11-03 MED ORDER — ALBUTEROL SULFATE (2.5 MG/3ML) 0.083% IN NEBU: 2.5000 mg | INHALATION_SOLUTION | RESPIRATORY_TRACT | Status: DC | PRN

## 2020-11-03 MED ORDER — ACETAMINOPHEN 325 MG PO TABS
650.0000 mg | ORAL_TABLET | Freq: Four times a day (QID) | ORAL | Status: DC | PRN
  Administered 2020-11-03 (×2): 650 mg via ORAL
  Filled 2020-11-03 (×2): qty 2

## 2020-11-03 NOTE — Evaluation (Signed)
Clinical/Bedside Swallow Evaluation Patient Details  Name: Ryan Copeland MRN: [id number] Date of Birth: 09-14-1950  Today's Date: 11/03/2020 Time: SLP Start Time (ACUTE ONLY): 0900 SLP Stop Time (ACUTE ONLY): 0915 SLP Time Calculation (min) (ACUTE ONLY): 15 min  Past Medical History:  Past Medical History:  Diagnosis Date   Anxiety 01/30/2014   Back injury    lower disc   CAD (coronary artery disease)    CLL (chronic lymphocytic leukemia) ([place]) 03/18/2011   COPD (chronic obstructive pulmonary disease) ([place])    DM type 2 (diabetes mellitus, type 2) (H[facility])    ECRB (extensor carpi radialis brevis) tenosynovitis    Foley catheter in place 11/2019   last changed 04-14-2020   GERD (gastroesophageal reflux disease)    takes Nexium if needed   History of blood transfusion 2015   Hyperlipidemia    Insomnia    Long-term current use of intravenous immunoglobulin (IVIG)    q month ivig   MI, acute, non ST segment elevation ([place]) 06/28/2009   with stenting of the LAD   Neuromuscular disorder ([place])    peripheral neuropathy both all toes   Pneumonia 11/2019   PVD (peripheral vascular disease) ([place])    Thrombocytopenia ([place])    Tobacco abuse    Past Surgical History:  Past Surgical History:  Procedure Laterality Date   [place] N/A 09/26/2014   Procedure: Left Heart Cath and Coronary Angiography;  Surgeon: Peter M Martinique, MD;  Location: [facility];  Service: Cardiovascular;  Laterality: N/A;   carpel tunnel release Left 04-1989   carpel tunnel release  Right 01-1989   CHOLECYSTECTOMY  2007   [place]   CORONARY STENT PLACEMENT  06/2009   stent to lad   CYSTOSCOPY N/A 04/20/2020   Procedure: [place];  Surgeon: Janith Lima, MD;  Location: [facility];  Service: Urology;  Laterality: N/A;  ONLY NEEDS 30 MIN   femoral stents  09/2014   iliac stent   IR CV LINE INJECTION   08/18/2017   IR CV LINE INJECTION  09/01/2017   IR CV LINE INJECTION  02/02/2018   LATERAL EPICONDYLE RELEASE Left 02/12/2014   Procedure: LEFT ELBOW DEBRIDEMENT WITH TENDON REPAIR ;  Surgeon: Lorn Junes, MD;  Location: [facility];  Service: Orthopedics;  Laterality: Left;   LEFT CAI STENT/PTA AND POPLITEAL ARTERY/TIBIAL THROMBECTOMY      LEFT HEART CATHETERIZATION WITH CORONARY ANGIOGRAM N/A 08/26/2011   Procedure: LEFT HEART CATHETERIZATION WITH CORONARY ANGIOGRAM;  Surgeon: Peter M Martinique, MD;  Location: [facility];  Service: Cardiovascular;  Laterality: N/A;   PERIPHERAL VASCULAR CATHETERIZATION N/A 01/01/2015   Procedure: Abdominal Aortogram;  Surgeon: Conrad Chilton, MD;  Location: [facility];  Service: Cardiovascular;  Laterality: N/A;   port a cath insertion  2014   right    TARSAL TUNNEL RELEASE Bilateral [id number]   HPI:  70 year old male with prior hx of 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 Patient denies any choking episodes or difficulty swallowing but endorses poor PO intake. CXR read as improving pneumonia with trace left pleural effusion since prior films, CT chest/abd/ pelvis obtained which noted LLL pna, and new patchy GGO and airspace opacities in the RUL and RLL, decreased small  bilateral pleural effusions, and unchanged mediastinal, left hilar, retroperitoneal and central mesenteric lymphadenopathy, unchanged marked splenomegaly, and unchanged small hyperdense right renal lesion.    Assessment / Plan / Recommendation  Clinical Impression  Pt demonstrates no overt oral or oropharyngeal dysphagia. He does not ahae dentition and only eats soft foods. When offered a regualr diet or a soft diet with cut up foods and extra gravy/sauce pt elected the soft diet. Will initiate a dys 3/thin diet. Pt can be upgraded if he  desires. SLP will sign off. SLP Visit Diagnosis: Dysphagia, unspecified (R13.10)    Aspiration Risk  Mild aspiration risk    Diet Recommendation Dysphagia 3 (Mech soft);Thin liquid   Liquid Administration via: Cup;Straw Medication Administration: Whole meds with liquid Supervision: Patient able to self feed    Other  Recommendations      Recommendations for follow up therapy are one component of a multi-disciplinary discharge planning process, led by the attending physician.  Recommendations may be updated based on patient status, additional functional criteria and insurance authorization.  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 Patient denies any choking episodes or difficulty swallowing but endorses poor PO intake. CXR read as improving pneumonia with trace left pleural effusion since prior films, CT chest/abd/ pelvis obtained which noted LLL pna, and new patchy GGO and airspace opacities in the RUL and RLL, decreased small bilateral pleural effusions, and unchanged mediastinal, left hilar, retroperitoneal and central mesenteric lymphadenopathy, unchanged marked splenomegaly, and unchanged small hyperdense right renal lesion. Type of Study: Bedside Swallow Evaluation Diet Prior to this Study: NPO Temperature Spikes Noted: No History of Recent Intubation: No Behavior/Cognition: Alert;Cooperative;Pleasant mood Oral Cavity Assessment: Within Functional Limits Oral Care Completed by SLP: No Oral Cavity - Dentition: Edentulous Vision: Functional for self-feeding Self-Feeding Abilities: Able to feed self Patient Positioning: Upright in bed Baseline Vocal Quality:  Normal Volitional Cough: Strong Volitional Swallow: Able to elicit    Oral/Motor/Sensory Function Overall Oral Motor/Sensory Function: Within functional limits   Ice Chips     Thin Liquid Thin Liquid: Within functional limits Presentation: Cup;Straw    Nectar Thick Nectar Thick Liquid: Not tested   Honey Thick Honey Thick Liquid: Not tested   Puree Puree: Within functional limits   Solid     Solid: Impaired Presentation: Self Fed Oral Phase Impairments: Impaired mastication Oral Phase Functional Implications: Impaired mastication      Nylah Butkus, Katherene Ponto 11/03/2020,10:10 AM

## 2020-11-03 NOTE — Progress Notes (Signed)
[facility] Progress Note Patient Name: Ryan Copeland DOB: [birth year] MRN: [id number]   Date of Service  11/03/2020  HPI/Events of Note  Pt with headache; has been NPO pending a swallow eval  eICU Interventions  Prn iv Toradol ordered     Intervention Category Minor Interventions: Routine modifications to care plan (e.g. PRN medications for pain, fever)  Tilden Dome 11/03/2020, 4:14 AM

## 2020-11-03 NOTE — Progress Notes (Addendum)
NAME:  Ryan Copeland, MRN:  [id number], DOB:  [birth year], LOS: 1 ADMISSION DATE:  11/02/2020, CONSULTATION DATE:  11/02/2020 REFERRING MD:  Dr. Pearline Cables, CHIEF COMPLAINT:  septic shock    History of Present Illness:  70 year old male with small cell B-cell lymphoma, CLL, emphysema, DMT2, chronic foley, GERD, HLD, CAD with previous MI, PVD, and chronic hypotension on midodrine presenting from home with altered mental status.   Recently hospitalized with pneumonia and UTI in August and LE cellulitis in September.  Was febrile, hypotensive, hypoxic.  WBC 2.6.  Started on broad abx.  Admitted with sepsis secondary to LLL and R pneumonia.  Ongoing hypotensive despite fluids, started on NE.   9/19 CT chest/abd/ pelvis >  LLL pna, and new patchy GGO and airspace opacities in the RUL and RLL, decreased small bilateral pleural effusions, and unchanged mediastinal, left hilar, retroperitoneal and central mesenteric lymphadenopathy, unchanged marked splenomegaly, and unchanged small hyperdense right renal lesion.   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    Interim History / Subjective:   Started on NE overnight, currently at NE 5 mcg/min Afebrile Much more awake/ interactive this morning, complains of ongoing headache s/p Toradol overnight without much improvement.  X7 bowel  movements overnight and x2 this morning.  Denies previous diarrhea or current abd pain.  Labs peripherally drawn x 2 this morning - both with reportedly vastly different results.  To be sent again- will access port in right chest and draw from there. Passed bedside swallow screen    Objective   Blood pressure 101/66, pulse 73, temperature 98 F (36.7 C), temperature source Axillary, resp. rate 17, SpO2 95 %.        Intake/Output Summary (Last 24 hours) at 11/03/2020 0902 Last data filed at 11/03/2020 0800 Gross per 24 hour  Intake 4343.42 ml  Output 550 ml  Net 3793.42 ml   There were no vitals filed for this visit.  Examination: General:  chronically ill male sitting upright in bed in NAD HEENT: MM pink/moist Neuro: Alert, oriented x 3, MAE, generalized weakness CV: rr, NSR, no murmur, port right chest not currently accessed  PULM:  non labored, diminished, on room air, GI: soft, +BS, ND, NT, suprapubic catheter Extremities: warm/dry, no LE edema, chronic venous stasis in LE Skin: bruising to arms  Resolved Hospital Problem list    Assessment & Plan:   Septic shock secondary to pneumonia Acute on chronic hypotension despite fluid resuscitation  LLL PNA with suspected involvement in RUL and RLL- CAP vs aspiration  Centrilobular emphysema  - mental status improving, lactic resolved - will access port in right chest, redraw AM labs, and use for NE infusion - continue NE for MAP goal >65 - restart home midodrine 7.5mg  BID as he passed bedside swallow - cortisol 12.3, will hold on stress dose steroids for now - stop vanc given neg MRSA PCR.  Continue  zosyn  - follow culture data  - currently on room air, supplemental O2 prn  - prn BD - ongoing pulmonary hygiene - pending am labs    Acute metabolic encephalopathy secondary to sepsis CTH neg.  Ammonia normal. - mental status continue to improve - serial neuro exams   Chronic hypotension - restart home midodrine as  above   Hypokalemia - pending am labs    DMT2 Aic 4.6 - CBG q 4, add SSI if > 180    CLL  Small cell B-cell lymphoma Hypogammaglobulinemia - due for IVIG this Friday/ 9/23  - Send IgG level  - hold calquence - may need to reach out to oncology    Mediastinal, left hilar, retroperitoneal and central mesenteric lymphadenopathy Prior scheduled ENB with Dr. Valeta Harms postponed x2 due to recent sickness  - has f/u appt with Dr. Valeta Harms 10/12   Hx urinary retention, BPH with LUTS and hypotonic bladder s/p suprapubic catheter 3/22 - continue foley care    Diarrhea - multiple episodes of diarrhea overnight.  Abd benign.  However he is immunosuppressed with multiple recent rounds of abx, high risk for cdiff.  Will send stool and placed on empiric precautions  Best Practice (right click and "Reselect all SmartList Selections" daily)   Diet/type: NPO progress diet as able  DVT prophylaxis: prophylactic heparin  GI prophylaxis: N/A Lines: N/A Foley:  Yes, and it is still needed- chronic suprapubic  Code Status:  full code Last date of multidisciplinary goals of care discussion [patient and wife updated at bedside 9/19].  Pending wife update 9/20  Labs   CBC: Recent Labs  Lab 11/02/20 1212  WBC 2.6*  NEUTROABS 1.0*  HGB 12.3*  HCT 40.5  MCV 96.9  PLT 40*    Basic Metabolic Panel: Recent Labs  Lab 11/02/20 1212  NA 140  K 3.4*  CL 108  CO2 22  GLUCOSE 86  BUN 23  CREATININE 1.22  CALCIUM 7.8*   GFR: Estimated Creatinine Clearance: 62.7 mL/min (by C-G formula based on SCr of 1.22 mg/dL). Recent Labs  Lab 11/02/20 1212 11/02/20 1412 11/03/20 0344  PROCALCITON  --   --  31.90  WBC 2.6*  --   --   LATICACIDVEN 2.1* 1.8  --     Liver Function Tests: Recent Labs  Lab 11/02/20 1212  AST 45*  ALT 23  ALKPHOS 67  BILITOT 0.7  PROT 4.9*  ALBUMIN 2.8*   No results for input(s): LIPASE, AMYLASE in the last 168 hours. Recent Labs  Lab 11/02/20 1213   AMMONIA 16    ABG    Component Value Date/Time   PHART 7.541 (H) 12/15/2019 1005   PCO2ART 29.8 (L) 12/15/2019 1005   PO2ART 78 (L) 12/15/2019 1005   HCO3 23.3 07/18/2020 0148   TCO2 24 07/18/2020 0148   ACIDBASEDEF 1.0 07/18/2020 0148   O2SAT 48.0 07/18/2020 0148     Coagulation Profile: Recent Labs  Lab 11/03/20 0344  INR 2.0*    Cardiac Enzymes: No results for input(s): CKTOTAL, CKMB, CKMBINDEX, TROPONINI in the last 168 hours.  HbA1C: Hgb A1c MFr Bld  Date/Time Value Ref Range Status  11/02/2020 07:10 PM 4.6 (L) 4.8 - 5.6 % Final    Comment:    (NOTE) Pre diabetes:          5.7%-6.4%  Diabetes:              >6.4%  Glycemic control for   <7.0% adults with diabetes  02/15/2020 05:03 AM 4.5 (L) 4.8 - 5.6 % Final    Comment:    (NOTE) Pre diabetes:          5.7%-6.4%  Diabetes:              >6.4%  Glycemic control for   <7.0% adults with diabetes     CBG: Recent Labs  Lab 11/02/20 1953 11/02/20 2310 11/02/20 2349 11/03/20 0337 11/03/20 0750  GLUCAP 84 63* 97 93 111*    Critical care time: 30 min     Kennieth Rad, ACNP [facility] 11/03/2020, 9:02 AM  See Amion for pager If no response to pager, please call PCCM consult pager After 7:00 pm call [place]

## 2020-11-04 ENCOUNTER — Telehealth: Payer: Self-pay

## 2020-11-04 DIAGNOSIS — J189 Pneumonia, unspecified organism: Secondary | ICD-10-CM | POA: Diagnosis not present

## 2020-11-04 DIAGNOSIS — A419 Sepsis, unspecified organism: Secondary | ICD-10-CM | POA: Diagnosis not present

## 2020-11-04 DIAGNOSIS — R6521 Severe sepsis with septic shock: Secondary | ICD-10-CM | POA: Diagnosis not present

## 2020-11-04 LAB — IGG: IgG (Immunoglobin G), Serum: 419 mg/dL — ABNORMAL LOW (ref 603–1613)

## 2020-11-04 LAB — BASIC METABOLIC PANEL
Anion gap: 7 (ref 5–15)
BUN: 21 mg/dL (ref 8–23)
CO2: 23 mmol/L (ref 22–32)
Calcium: 8.3 mg/dL — ABNORMAL LOW (ref 8.9–10.3)
Chloride: 109 mmol/L (ref 98–111)
Creatinine, Ser: 0.98 mg/dL (ref 0.61–1.24)
GFR, Estimated: >60 mL/min (ref >60)
Glucose, Bld: 92 mg/dL (ref 70–99)
Potassium: 4 mmol/L (ref 3.5–5.1)
Sodium: 139 mmol/L (ref 135–145)

## 2020-11-04 LAB — CBC
HCT: 29.3 % — ABNORMAL LOW (ref 39.0–52.0)
Hemoglobin: 9.4 g/dL — ABNORMAL LOW (ref 13.0–17.0)
MCH: 29.7 pg (ref 26.0–34.0)
MCHC: 32.1 g/dL (ref 30.0–36.0)
MCV: 92.7 fL (ref 80.0–100.0)
Platelets: 37 10*3/uL — ABNORMAL LOW (ref 150–400)
RBC: 3.16 MIL/uL — ABNORMAL LOW (ref 4.22–5.81)
RDW: 21.9 % — ABNORMAL HIGH (ref 11.5–15.5)
WBC: 5.6 10*3/uL (ref 4.0–10.5)
nRBC: 0 % (ref 0.0–0.2)

## 2020-11-04 LAB — GLUCOSE, CAPILLARY: Glucose-Capillary: 155 mg/dL — ABNORMAL HIGH (ref 70–99)

## 2020-11-04 LAB — MAGNESIUM: Magnesium: 2.5 mg/dL — ABNORMAL HIGH (ref 1.7–2.4)

## 2020-11-04 MED ORDER — AMOXICILLIN-POT CLAVULANATE 875-125 MG PO TABS
1.0000 | ORAL_TABLET | Freq: Two times a day (BID) | ORAL | Status: DC
  Administered 2020-11-04 – 2020-11-06 (×5): 1 via ORAL
  Filled 2020-11-04 (×5): qty 1

## 2020-11-04 MED ORDER — HYDROCODONE-ACETAMINOPHEN 10-325 MG PO TABS
1.0000 | ORAL_TABLET | Freq: Four times a day (QID) | ORAL | Status: DC | PRN
  Administered 2020-11-04 – 2020-11-05 (×4): 1 via ORAL
  Filled 2020-11-04 (×4): qty 1

## 2020-11-04 NOTE — Progress Notes (Signed)
NAME:  Ryan Copeland, MRN:  [id number], DOB:  1950-12-28, LOS: 2 ADMISSION DATE:  11/02/2020, CONSULTATION DATE:  11/02/2020 REFERRING MD:  Dr. 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  Admitted with septic shock requiring vasopressor support.   Recent hospitalizations in August for pneumonia and UTI and treated for LE cellulitis in September.   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 9/20  Passed SLP-> started on diet.  Midodrine restarted.  Weaning NE but remains on low dose.  Mental status much improved.  Vanc stopped.    Interim History / Subjective:  No complaints from patient other than wanting his diet changed from soft to regular diet and that his food has been cold.    Afebrile  Remains on NE at 1 mcg/min  Objective   Blood pressure (!) 97/51, pulse 80, temperature 98 F (36.7 C), temperature source Oral, resp. rate (!) 21, SpO2 95 %.        Intake/Output Summary (Last 24 hours) at 11/04/2020 0949 Last data filed at 11/04/2020 0800 Gross per 24 hour  Intake 920.76 ml  Output 1325 ml  Net -404.24 ml   There were no vitals filed for this visit.  Examination: General:  Chronically ill appearing older male  sitting upright in bed watching TV HEENT: MM pink/moist, edentulous  Neuro:  Alert, oriented, MAE CV: rr, NSR, no murmur, port in right chest accessed PULM:  non labored, clear anteriorly, diminished in bases  GI: soft, bs+, ND/NT, suprapubic catheter Extremities: warm/dry, no LE edema, chronic venous stasis changes.    Labs reviewed.  BMET stable.  CBC stable.    Resolved Hospital Problem list    Assessment & Plan:   Septic shock secondary to pneumonia  LLL PNA with suspected involvement in RUL and RLL Centrilobular emphysema   - hopefully will be able to stop low dose NE today - continue midodrine 10mg  TID - will change zosyn to augmentin, stop date 9/25 - supplemental O2 prn, currently doing well on room air.  Aspiration ruled out, passed SLP - follow culture data, neg thus far - Trend BMP / urinary output, sCr and UOP remains stable thus far - prn BD, ongoing pulmonary hygiene, IS, mobilize today with PT/ OT   Chronic hypotension - continue midodrine 10mg  BID   Hypokalemia Hypomagnesemia  - stable today - trend electrolytes, replete as needed   DMT2 - controlled.  A1c 4.6 on 9/19 - trend on BMET   CLL  Small cell B-cell lymphoma  Hypogammaglobulinemia - due for IVIG this Friday/ 9/23.  IgG 419.   - hold calquence - Patient is followed by Dr. Alvy Bimler.  She will be back in office on Friday.  Patient appears to have missed his last scheduled dose of IVIG.  Can reach out then, if patient  is still hospitalized for further recommendations about getting his IVIG restarted.    Mediastinal, left hilar, retroperitoneal and central mesenteric lymphadenopathy - has had ENB postponed x2 given recent hospitalizations.   - has f/u appt with Dr. Valeta Harms on 10/12 to get ENB rescheduled    Hx urinary retention, BPH with LUTS and hypotonic bladder s/p suprapubic catheter 3/22 - continue foley care  Diarrhea - appears to have chronic diarrhea - no further diarrhea since  yesterday.  Cdiff neg.  Monitor.   Best Practice (right click and "Reselect all SmartList Selections" daily)   Diet/type: Regular consistency (see orders) has been on soft diet given he is edentulous but pt asking for regular food.  Does not wear dentures at home and eats "fine" per pt. DVT prophylaxis: prophylactic heparin  GI prophylaxis: N/A Lines: N/A Foley:  Yes, and it is still needed- chronic suprapubic catheter  Code Status:  full code Last date of multidisciplinary goals of care discussion:  patient updated on plan of care 9/21.    Labs   CBC: Recent Labs  Lab 11/02/20 1212 11/03/20 0957 11/04/20 0339  WBC 2.6* 4.8 5.6  NEUTROABS 1.0*  --   --   HGB 12.3* 9.9* 9.4*  HCT 40.5 30.7* 29.3*  MCV 96.9 92.5 92.7  PLT 40* 35* 37*    Basic Metabolic Panel: Recent Labs  Lab 11/02/20 1212 11/03/20 0957 11/04/20 0339  NA 140 138 139  K 3.4* 3.2* 4.0  CL 108 107 109  CO2 22 23 23   GLUCOSE 86 134* 92  BUN 23 22 21   CREATININE 1.22 0.85 0.98  CALCIUM 7.8* 8.0* 8.3*  MG  --  1.4* 2.5*  PHOS  --  3.0  --    GFR: Estimated Creatinine Clearance: 78.1 mL/min (by C-G formula based on SCr of 0.98 mg/dL). Recent Labs  Lab 11/02/20 1212 11/02/20 1412 11/03/20 0344 11/03/20 0957 11/04/20 0339  PROCALCITON  --   --  31.90  --   --   WBC 2.6*  --   --  4.8 5.6  LATICACIDVEN 2.1* 1.8  --   --   --     Liver Function Tests: Recent Labs  Lab 11/02/20 1212 11/03/20 0957  AST 45* 63*  ALT 23 26  ALKPHOS 67 50  BILITOT 0.7 1.1  PROT 4.9* 4.7*  ALBUMIN 2.8* 2.5*   No results for input(s): LIPASE, AMYLASE in the last 168 hours. Recent Labs  Lab 11/02/20 1213  AMMONIA 16    ABG    Component Value Date/Time   PHART 7.541 (H) 12/15/2019 1005   PCO2ART 29.8 (L) 12/15/2019 1005   PO2ART 78 (L) 12/15/2019 1005   HCO3 23.3 07/18/2020 0148   TCO2 24 07/18/2020 0148   ACIDBASEDEF 1.0 07/18/2020 0148   O2SAT 48.0 07/18/2020 0148     Coagulation Profile: Recent  Labs  Lab 11/03/20 0344  INR 2.0*    Cardiac Enzymes: No results for input(s): CKTOTAL, CKMB, CKMBINDEX, TROPONINI in the last 168 hours.  HbA1C: Hgb A1c MFr Bld  Date/Time Value Ref Range Status  11/02/2020 07:10 PM 4.6 (L) 4.8 - 5.6 % Final    Comment:    (NOTE) Pre diabetes:          5.7%-6.4%  Diabetes:              >6.4%  Glycemic control for   <7.0% adults with diabetes   02/15/2020 05:03 AM 4.5 (L) 4.8 - 5.6 % Final  Comment:    (NOTE) Pre diabetes:          5.7%-6.4%  Diabetes:              >6.4%  Glycemic control for   <7.0% adults with diabetes     CBG: Recent Labs  Lab 11/02/20 2349 11/03/20 0337 11/03/20 0750 11/03/20 1120 11/03/20 1514  GLUCAP 97 93 111* 132* 138*     Critical care time: 30 min     Kennieth Rad, ACNP [facility] 11/04/2020, 9:49 AM  See Amion for pager If no response to pager, please call PCCM consult pager After 7:00 pm call [place]

## 2020-11-04 NOTE — Telephone Encounter (Signed)
Received phone call from Patient requesting to cancel 9/23 appointment due to hospitalization for pneumonia. Appointment cancelled per request. Patient stated that he will call to reschedule

## 2020-11-05 ENCOUNTER — Encounter: Payer: Self-pay | Admitting: Hematology and Oncology

## 2020-11-05 DIAGNOSIS — J189 Pneumonia, unspecified organism: Secondary | ICD-10-CM | POA: Diagnosis not present

## 2020-11-05 DIAGNOSIS — R6521 Severe sepsis with septic shock: Secondary | ICD-10-CM | POA: Diagnosis not present

## 2020-11-05 DIAGNOSIS — A419 Sepsis, unspecified organism: Secondary | ICD-10-CM | POA: Diagnosis not present

## 2020-11-05 LAB — BASIC METABOLIC PANEL
Anion gap: 5 (ref 5–15)
BUN: 15 mg/dL (ref 8–23)
CO2: 24 mmol/L (ref 22–32)
Calcium: 8.4 mg/dL — ABNORMAL LOW (ref 8.9–10.3)
Chloride: 108 mmol/L (ref 98–111)
Creatinine, Ser: 0.81 mg/dL (ref 0.61–1.24)
GFR, Estimated: >60 mL/min (ref >60)
Glucose, Bld: 216 mg/dL — ABNORMAL HIGH (ref 70–99)
Potassium: 3.7 mmol/L (ref 3.5–5.1)
Sodium: 137 mmol/L (ref 135–145)

## 2020-11-05 MED ORDER — MIDODRINE HCL 5 MG PO TABS
15.0000 mg | ORAL_TABLET | Freq: Three times a day (TID) | ORAL | Status: DC
  Administered 2020-11-05 – 2020-11-06 (×5): 15 mg via ORAL
  Filled 2020-11-05 (×6): qty 3

## 2020-11-05 NOTE — Evaluation (Signed)
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  PTA he lives with his spouse, and needed no assist with ADL/IADL.  He continues to drive.  Deficits impacting independence are listed below.  Currently he is needing supervision for mobility and ADL completion from a sit/stand level.  Encourage up and mobilizing on the unit with staff.  OT to follow in the acute setting, but no post acute OT is anticipated.        Recommendations for follow up therapy are one component of a multi-disciplinary discharge planning process, led by the attending physician.  Recommendations may be updated based on patient status, additional functional criteria and insurance authorization.   Follow Up Recommendations  No OT follow up    Equipment Recommendations  None recommended by OT    Recommendations for Other Services       Precautions / Restrictions Precautions Precautions: Fall Restrictions Weight Bearing Restrictions: No      Mobility Bed Mobility Overal bed mobility: Modified Independent               Patient Response: Cooperative  Transfers Overall transfer level: Needs assistance Equipment used: Rolling walker (2 wheeled) Transfers: Sit to/from [facility] Sit to Stand: Modified independent (Device/Increase time) Stand pivot transfers: Supervision            Balance Overall balance assessment: Needs assistance Sitting-balance support: Feet supported Sitting balance-Leahy Scale: Good     Standing balance support: Bilateral upper  extremity supported Standing balance-Leahy Scale: Fair Standing balance comment: able to static stand without AD                           ADL either performed or assessed with clinical judgement   ADL               Lower Body Bathing: Supervison/ safety;Sit to/from stand       Lower Body Dressing: Supervision/safety;Sit to/from stand   Toilet Transfer: Supervision/safety;RW                   Vision Baseline Vision/History: 1 Wears glasses Patient Visual Report: No change from baseline       Perception     Praxis      Pertinent Vitals/Pain Pain Assessment: No/denies pain     Hand Dominance Right   Extremity/Trunk Assessment Upper Extremity Assessment Upper Extremity Assessment: Overall WFL for tasks assessed   Lower Extremity Assessment Lower Extremity Assessment: Defer to PT evaluation   Cervical / Trunk Assessment Cervical / Trunk Assessment: Normal   Communication Communication Communication: No difficulties   Cognition Arousal/Alertness: Awake/alert Behavior During Therapy: WFL for tasks assessed/performed Overall Cognitive Status: Within Functional Limits for tasks assessed                                                      Home Living Family/patient expects to be discharged to::  Private residence Living Arrangements: Spouse/significant other Available Help at Discharge: Family;Available 24 hours/day;Other (Comment) Type of Home: House Home Access: Stairs to enter [employer] of Steps: either goes in through the basement 11 steps with L ascending rail, or in the back with 7 steps and B rails Entrance Stairs-Rails: Right Home Layout: Two level;Laundry or work area in Building surveyor of Steps: 11   Bathroom Shower/Tub: Teacher, early years/pre: Standard     Home Equipment: Environmental consultant - 2 wheels;Tub bench;Shower seat          Prior Functioning/Environment  Level of Independence: Independent with assistive device(s)        Comments: Using a RW to times.  Continues to drive and needs no assist with ADL/IADL and medication management.        OT Problem List: Decreased activity tolerance;Impaired balance (sitting and/or standing)      OT Treatment/Interventions: Self-care/ADL training;Therapeutic activities;Balance training    OT Goals(Current goals can be found in the care plan section) Acute Rehab OT Goals Patient Stated Goal: Return home with spouse. OT Goal Formulation: With patient Time For Goal Achievement: 11/19/20 Potential to Achieve Goals: Good ADL Goals Pt Will Perform Lower Body Bathing: with modified independence;sit to/from stand Pt Will Perform Lower Body Dressing: with modified independence;sit to/from stand Pt Will Transfer to Toilet: with modified independence;ambulating;regular height toilet  OT Frequency: Min 2X/week   Barriers to D/C:    none noted       Co-evaluation PT/OT/SLP Co-Evaluation/Treatment: Yes Reason for Co-Treatment: Complexity of the patient's impairments (multi-system involvement);For patient/therapist safety   OT goals addressed during session: ADL's and self-care      AM-PAC OT "6 Clicks" Daily Activity     Outcome Measure Help from another person eating meals?: None Help from another person taking care of personal grooming?: None Help from another person toileting, which includes using toliet, bedpan, or urinal?: A Little Help from another person bathing (including washing, rinsing, drying)?: A Little Help from another person to put on and taking off regular upper body clothing?: None Help from another person to put on and taking off regular lower body clothing?: A Little 6 Click Score: 21   End of Session Equipment Utilized During Treatment: Rolling walker Nurse Communication: Mobility status  Activity Tolerance: Patient tolerated treatment well Patient left: in chair;with call  bell/phone within reach;with chair alarm set  OT Visit Diagnosis: Unsteadiness on feet (R26.81)                Time: 1040-1051 OT Time Calculation (min): 11 min Charges:  OT General Charges $OT Visit: 1 Visit OT Evaluation $OT Eval Moderate Complexity: 1 Mod  11/05/2020  RP, OTR/L  Acute Rehabilitation Services  Office:  [phone]   Metta Clines 11/05/2020, 11:55 AM

## 2020-11-05 NOTE — Progress Notes (Signed)
NAME:  Ryan Copeland, MRN:  [id number], DOB:  [birth year], LOS: 3 ADMISSION DATE:  11/02/2020, CONSULTATION DATE:  11/02/2020 REFERRING MD:  Dr. 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  Admitted with septic shock requiring vasopressor support.   Recent hospitalizations in August for pneumonia and UTI and treated for LE cellulitis in September.   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 9/20  Passed SLP-> started on diet.  Midodrine restarted.  Weaning NE but remains on low dose.  Mental status much improved.  Vanc stopped.   9/21 NE weaned off, Bps soft overnight to 9/22  Interim History / Subjective:  NAEON. Says feeling better. Stayed off NE overnight. Bps low when sleeping. UOP ok.   Objective   Blood pressure (!) 106/59, pulse 79, temperature 98 F (36.7 C), temperature source Oral, resp. rate 17, SpO2 91 %.        Intake/Output Summary (Last 24 hours) at 11/05/2020 0811 Last data filed at 11/05/2020 0800 Gross per 24 hour  Intake 486.58 ml  Output 875 ml  Net -388.42 ml    There were no vitals filed for this visit.  Examination: General:  Chronically ill appearing older male sitting upright in bed eating  breakfast HEENT: MM pink/moist, edentulous  Neuro:  Alert, oriented, MAE CV: rr, NSR, no murmur, port in right chest accessed PULM:  non labored, clear anteriorly, diminished in bases  GI: soft, bs+, ND/NT, suprapubic catheter Extremities: warm/dry, no LE edema, chronic venous stasis changes.    Labs pending  Resolved Hospital Problem list    Assessment & Plan:   Septic shock secondary to pneumonia  LLL PNA with suspected involvement in RUL and RLL Centrilobular emphysema   - hopefully will be able to stop low dose NE today - continue midodrine 10mg  TID - zosyn to augmentin, stop date 9/25 - supplemental O2 prn, currently doing well on room air - follow culture data, neg thus far - Trend BMP / urinary output, sCr and UOP remains stable thus far - prn BD, ongoing pulmonary hygiene, IS, mobilize today with PT/ OT  Chronic hypotension - increase  midodrine 15 mg BID  Hypokalemia Hypomagnesemia  - stable today - trend electrolytes, replete as needed   DMT2 - controlled.  A1c 4.6 on 9/19 - trend on BMET  CLL  Small cell B-cell lymphoma  Hypogammaglobulinemia - due for IVIG this Friday/ 9/23.  IgG 419.   - hold calquence - Patient is followed by Dr. Alvy Bimler.  She will be back in office on Friday.  Patient appears to have missed his last scheduled dose of IVIG.  Can reach out then, if patient is still hospitalized for further recommendations about getting his IVIG restarted.   Mediastinal, left hilar, retroperitoneal and central mesenteric lymphadenopathy - has had ENB postponed  x2 given recent hospitalizations.   - has f/u appt with Dr. 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  Cdiff neg.  Monitor.   Best Practice (right click and "Reselect all SmartList Selections" daily)    Diet/type: Regular consistency (see orders) has been on soft diet given he is edentulous but pt asking for regular food.  Does not wear dentures at home and eats "fine" per pt. DVT prophylaxis: prophylactic heparin  GI prophylaxis: N/A Lines: N/A Foley:  Yes, and it is still needed- chronic suprapubic catheter  Code Status:  full code Last date of multidisciplinary goals of care discussion:  patient updated on plan of care 9/22.    PT ordered 9/21, plan transfer out of ICU 9/22  Labs   CBC: Recent Labs  Lab 11/02/20 1212 11/03/20 0957 11/04/20 0339  WBC 2.6* 4.8 5.6  NEUTROABS 1.0*  --   --   HGB 12.3* 9.9* 9.4*  HCT 40.5 30.7* 29.3*  MCV 96.9 92.5 92.7  PLT 40* 35* 37*     Basic Metabolic Panel: Recent Labs  Lab 11/02/20 1212 11/03/20 0957 11/04/20 0339  NA 140 138 139  K 3.4* 3.2* 4.0  CL 108 107 109  CO2 22 23 23   GLUCOSE 86 134* 92  BUN 23 22 21   CREATININE 1.22 0.85 0.98  CALCIUM 7.8* 8.0* 8.3*  MG  --  1.4* 2.5*  PHOS  --  3.0  --     GFR: Estimated Creatinine Clearance: 78.1 mL/min (by C-G formula based on SCr of 0.98 mg/dL). Recent Labs  Lab 11/02/20 1212 11/02/20 1412 11/03/20 0344 11/03/20 0957 11/04/20 0339  PROCALCITON  --   --  31.90  --   --   WBC 2.6*  --   --  4.8 5.6  LATICACIDVEN 2.1* 1.8  --   --   --      Liver Function Tests: Recent Labs  Lab 11/02/20 1212 11/03/20 0957  AST 45* 63*  ALT 23 26  ALKPHOS 67 50  BILITOT 0.7 1.1  PROT 4.9* 4.7*  ALBUMIN 2.8* 2.5*    No results for input(s): LIPASE, AMYLASE in the last 168 hours. Recent Labs  Lab 11/02/20 1213  AMMONIA 16     ABG    Component Value Date/Time   PHART 7.541 (H) 12/15/2019 1005   PCO2ART 29.8 (L) 12/15/2019 1005   PO2ART 78 (L) 12/15/2019 1005   HCO3 23.3 07/18/2020 0148   TCO2 24 07/18/2020 0148   ACIDBASEDEF 1.0 07/18/2020 0148   O2SAT 48.0 07/18/2020 0148      Coagulation Profile: Recent Labs  Lab 11/03/20 0344  INR 2.0*      Cardiac Enzymes: No results for input(s): CKTOTAL, CKMB, CKMBINDEX, TROPONINI in the last 168 hours.  HbA1C: Hgb A1c MFr Bld  Date/Time Value Ref Range Status  11/02/2020 07:10 PM 4.6 (L) 4.8 - 5.6 % Final    Comment:    (NOTE) Pre diabetes:          5.7%-6.4%  Diabetes:              >6.4%  Glycemic control for   <7.0% adults with diabetes   02/15/2020 05:03 AM 4.5 (L) 4.8 - 5.6 % Final    Comment:    (NOTE) Pre diabetes:  5.7%-6.4%  Diabetes:              >6.4%  Glycemic control for   <7.0% adults with diabetes     CBG: Recent Labs  Lab 11/03/20 0337 11/03/20 0750 11/03/20 1120 11/03/20 1514 11/04/20 2013  GLUCAP 93 111* 132* 138* 155*      Critical care time:    N/a   Lanier Clam, MD [facility] 11/05/2020, 8:11 AM  See Amion for pager If no response to pager, please call PCCM consult pager After 7:00 pm call Elink

## 2020-11-05 NOTE — Evaluation (Signed)
Physical Therapy Evaluation Patient Details Name: Ryan Copeland MRN: [id number] DOB: 1951/01/19 Today's Date: 11/05/2020  History of Present Illness  70 year old male presenting 9/19 from home with altered mental status, sepsis nearing septic shock, imaging consistent with L LL PNA.   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  Clinical Impression  Pt admitted with/for AMS and L LL PNA.  Presently mobilizing and a mod I to min guard level for gait.  Expect quick progress and a short term benefit for HHPT..  Pt currently limited functionally due to the problems listed below.  (see problems list.)  Pt will benefit from PT to maximize function and safety to be able to get home safely with available assist.        Recommendations for follow up therapy are one component of a multi-disciplinary discharge planning process, led by the attending physician.  Recommendations may be updated based on patient status, additional functional criteria and insurance authorization.  Follow Up Recommendations Home health PT;Supervision - Intermittent    Equipment Recommendations  None recommended by PT;Other (comment) (TBA)    Recommendations for Other Services       Precautions / Restrictions Precautions Precautions: Fall Restrictions Weight Bearing Restrictions: No      Mobility  Bed Mobility Overal bed mobility: Modified Independent                  Transfers Overall transfer level: Needs assistance Equipment used: Rolling walker (2 wheeled) Transfers: Sit to/from [employer] Transfers Sit to Stand: Modified independent (Device/Increase time) Stand pivot transfers: Supervision          Ambulation/Gait Ambulation/Gait assistance: Min guard Gait Distance (Feet): 80 Feet Assistive device: Rolling walker (2 wheeled) Gait Pattern/deviations: Step-through pattern    Gait velocity interpretation: <1.8 ft/sec, indicate of risk for recurrent falls General Gait Details: generally steady, but halted and mildly weak heel toe pattern, slower with moderate use of the RW  Stairs            Wheelchair Mobility    Modified Rankin (Stroke Patients Only)       Balance Overall balance assessment: Needs assistance Sitting-balance support: Feet supported Sitting balance-Leahy Scale: Good     Standing balance support: Bilateral upper extremity supported Standing balance-Leahy Scale: Fair Standing balance comment: able to static stand without AD                             Pertinent Vitals/Pain Pain Assessment: No/denies pain    Home Living Family/patient expects to be discharged to:: Private residence Living Arrangements: Spouse/significant other Available Help at Discharge: Family;Available 24 hours/day;Other (Comment) Type of Home: House Home Access: Stairs to enter Entrance Stairs-Rails: Right Entrance Stairs-Number of Steps: either goes in through the basement 11 steps with L ascending rail, or in the back with 7 steps and B rails Home Layout: Two level;Laundry or work area in [place]: Environmental consultant - 2 wheels;Tub bench;Shower seat      Prior Function Level of Independence: Independent with assistive device(s)         Comments: Using a RW to times.  Continues to drive and needs no assist with ADL/IADL and medication management.     Hand Dominance   Dominant Hand: Right    Extremity/Trunk Assessment   Upper Extremity Assessment Upper Extremity Assessment: Defer to  OT evaluation    Lower Extremity Assessment Lower Extremity Assessment: Generalized weakness    Cervical / Trunk Assessment Cervical / Trunk Assessment: Normal  Communication   Communication: No difficulties  Cognition Arousal/Alertness: Awake/alert Behavior During Therapy: WFL for tasks assessed/performed Overall Cognitive Status: Within  Functional Limits for tasks assessed                                        General Comments General comments (skin integrity, edema, etc.): vss overall on RA    Exercises     Assessment/Plan    PT Assessment Patient needs continued PT services  PT Problem List Decreased strength;Decreased activity tolerance;Decreased balance;Decreased mobility;Decreased knowledge of use of DME;Cardiopulmonary status limiting activity       PT Treatment Interventions Gait training;DME instruction;Stair training;Functional mobility training;Therapeutic activities;Balance training;Patient/family education    PT Goals (Current goals can be found in the Care Plan section)  Acute Rehab PT Goals Patient Stated Goal: Return home with spouse. PT Goal Formulation: With patient Time For Goal Achievement: 11/19/20 Potential to Achieve Goals: Good    Frequency Min 3X/week   Barriers to discharge        Co-evaluation   Reason for Co-Treatment: Complexity of the patient's impairments (multi-system involvement);For patient/therapist safety   OT goals addressed during session: ADL's and self-care       AM-PAC PT "6 Clicks" Mobility  Outcome Measure Help needed turning from your back to your side while in a flat bed without using bedrails?: A Little Help needed moving from lying on your back to sitting on the side of a flat bed without using bedrails?: A Little Help needed moving to and from a bed to a chair (including a wheelchair)?: A Little Help needed standing up from a chair using your arms (e.g., wheelchair or bedside chair)?: A Little Help needed to walk in hospital room?: A Little Help needed climbing 3-5 steps with a railing? : A Little 6 Click Score: 18    End of Session   Activity Tolerance: Patient tolerated treatment well Patient left: in chair;with call bell/phone within reach Nurse Communication: Mobility status PT Visit Diagnosis: Other abnormalities of gait and  mobility (R26.89);Muscle weakness (generalized) (M62.81)    Time: 8768-1157 PT Time Calculation (min) (ACUTE ONLY): 23 min   Charges:   PT Evaluation $PT Eval Low Complexity: 1 Low          11/05/2020  Ginger Carne., PT Acute Rehabilitation Services [phone]  (pager) [phone]  (office)  Tessie Fass Marissah [place] 11/05/2020, 1:40 PM

## 2020-11-06 ENCOUNTER — Ambulatory Visit: Payer: Medicare Other

## 2020-11-06 ENCOUNTER — Other Ambulatory Visit: Payer: Self-pay

## 2020-11-06 ENCOUNTER — Other Ambulatory Visit: Payer: Self-pay | Admitting: Hematology and Oncology

## 2020-11-06 ENCOUNTER — Other Ambulatory Visit (HOSPITAL_COMMUNITY): Payer: Self-pay

## 2020-11-06 DIAGNOSIS — R6521 Severe sepsis with septic shock: Secondary | ICD-10-CM | POA: Diagnosis not present

## 2020-11-06 DIAGNOSIS — A419 Sepsis, unspecified organism: Secondary | ICD-10-CM | POA: Diagnosis not present

## 2020-11-06 DIAGNOSIS — J189 Pneumonia, unspecified organism: Secondary | ICD-10-CM | POA: Diagnosis not present

## 2020-11-06 LAB — DIFFERENTIAL
Abs Immature Granulocytes: 0 10*3/uL (ref 0.00–0.07)
Band Neutrophils: 6 %
Basophils Absolute: 0 10*3/uL (ref 0.0–0.1)
Basophils Relative: 0 %
Eosinophils Absolute: 0.1 10*3/uL (ref 0.0–0.5)
Eosinophils Relative: 3 %
Lymphocytes Relative: 33 %
Lymphs Abs: 0.6 10*3/uL — ABNORMAL LOW (ref 0.7–4.0)
Monocytes Absolute: 0.2 10*3/uL (ref 0.1–1.0)
Monocytes Relative: 13 %
Neutro Abs: 1 10*3/uL — ABNORMAL LOW (ref 1.7–7.7)
Neutrophils Relative %: 45 %

## 2020-11-06 LAB — RENAL FUNCTION PANEL
Albumin: 2.8 g/dL — ABNORMAL LOW (ref 3.5–5.0)
Anion gap: 7 (ref 5–15)
BUN: 11 mg/dL (ref 8–23)
CO2: 24 mmol/L (ref 22–32)
Calcium: 8.8 mg/dL — ABNORMAL LOW (ref 8.9–10.3)
Chloride: 107 mmol/L (ref 98–111)
Creatinine, Ser: 0.69 mg/dL (ref 0.61–1.24)
GFR, Estimated: >60 mL/min (ref >60)
Glucose, Bld: 112 mg/dL — ABNORMAL HIGH (ref 70–99)
Phosphorus: 2.9 mg/dL (ref 2.5–4.6)
Potassium: 3.7 mmol/L (ref 3.5–5.1)
Sodium: 138 mmol/L (ref 135–145)

## 2020-11-06 LAB — MAGNESIUM: Magnesium: 1.7 mg/dL (ref 1.7–2.4)

## 2020-11-06 LAB — CBC
HCT: 30.9 % — ABNORMAL LOW (ref 39.0–52.0)
HCT: 30.9 % — ABNORMAL LOW (ref 39.0–52.0)
Hemoglobin: 10 g/dL — ABNORMAL LOW (ref 13.0–17.0)
Hemoglobin: 10 g/dL — ABNORMAL LOW (ref 13.0–17.0)
MCH: 29.9 pg (ref 26.0–34.0)
MCH: 30 pg (ref 26.0–34.0)
MCHC: 32.4 g/dL (ref 30.0–36.0)
MCHC: 32.4 g/dL (ref 30.0–36.0)
MCV: 92.2 fL (ref 80.0–100.0)
MCV: 92.8 fL (ref 80.0–100.0)
Platelets: 36 10*3/uL — ABNORMAL LOW (ref 150–400)
Platelets: 35 10*3/uL — ABNORMAL LOW (ref 150–400)
RBC: 3.35 MIL/uL — ABNORMAL LOW (ref 4.22–5.81)
RBC: 3.33 MIL/uL — ABNORMAL LOW (ref 4.22–5.81)
RDW: 20.7 % — ABNORMAL HIGH (ref 11.5–15.5)
RDW: 20.5 % — ABNORMAL HIGH (ref 11.5–15.5)
WBC: 1.9 10*3/uL — ABNORMAL LOW (ref 4.0–10.5)
WBC: 1.9 10*3/uL — ABNORMAL LOW (ref 4.0–10.5)
nRBC: 0 % (ref 0.0–0.2)
nRBC: 0 % (ref 0.0–0.2)

## 2020-11-06 LAB — GLUCOSE, CAPILLARY: Glucose-Capillary: 118 mg/dL — ABNORMAL HIGH (ref 70–99)

## 2020-11-06 MED ORDER — MAGNESIUM SULFATE 2 GM/50ML IV SOLN
2.0000 g | Freq: Once | INTRAVENOUS | Status: AC
  Administered 2020-11-06: 2 g via INTRAVENOUS
  Filled 2020-11-06: qty 50

## 2020-11-06 MED ORDER — IMMUNE GLOBULIN (HUMAN) 10 GM/100ML IV SOLN: 0.5000 g/kg | Freq: Once | INTRAVENOUS | Status: DC

## 2020-11-06 MED ORDER — MIDODRINE HCL 5 MG PO TABS
15.0000 mg | ORAL_TABLET | Freq: Three times a day (TID) | ORAL | 2 refills | Status: DC
  Filled 2020-11-06 – 2020-12-18 (×2): qty 270, 30d supply, fill #0

## 2020-11-06 MED ORDER — SODIUM CHLORIDE 0.9% FLUSH: 10.0000 mL | INTRAVENOUS | Status: DC | PRN

## 2020-11-06 MED ORDER — ACETAMINOPHEN 325 MG PO TABS
650.0000 mg | ORAL_TABLET | Freq: Once | ORAL | Status: AC
  Administered 2020-11-06: 650 mg via ORAL
  Filled 2020-11-06: qty 2

## 2020-11-06 MED ORDER — SODIUM CHLORIDE 0.9% FLUSH: 10.0000 mL | Freq: Two times a day (BID) | INTRAVENOUS | Status: DC

## 2020-11-06 MED ORDER — DIPHENHYDRAMINE HCL 25 MG PO CAPS
25.0000 mg | ORAL_CAPSULE | Freq: Once | ORAL | Status: AC
  Administered 2020-11-06: 25 mg via ORAL
  Filled 2020-11-06: qty 1

## 2020-11-06 MED ORDER — HEPARIN SOD (PORK) LOCK FLUSH 100 UNIT/ML IV SOLN
500.0000 [IU] | INTRAVENOUS | Status: AC | PRN
  Administered 2020-11-06: 500 [IU]
  Filled 2020-11-06: qty 5

## 2020-11-06 MED ORDER — AMOXICILLIN-POT CLAVULANATE 875-125 MG PO TABS
1.0000 | ORAL_TABLET | Freq: Two times a day (BID) | ORAL | 0 refills | Status: AC
  Filled 2020-11-06: qty 5, 3d supply, fill #0

## 2020-11-06 MED ORDER — METHYLPREDNISOLONE SODIUM SUCC 125 MG IJ SOLR
40.0000 mg | Freq: Once | INTRAMUSCULAR | Status: AC
  Administered 2020-11-06: 40 mg via INTRAVENOUS
  Filled 2020-11-06: qty 2

## 2020-11-06 MED ORDER — IMMUNE GLOBULIN (HUMAN) 10 GM/100ML IJ SOLN
0.5000 g/kg | Freq: Once | INTRAMUSCULAR | Status: AC
  Administered 2020-11-06: 40 g via INTRAVENOUS
  Filled 2020-11-06: qty 400

## 2020-11-06 NOTE — TOC Transition Note (Signed)
Transition of Care [facility]) - CM/SW Discharge Note   Patient Details  Name: Ryan Copeland MRN: [id number] Date of Birth: 12-May-1950  Transition of Care [facility]) CM/SW Contact:  Verdell Carmine, RN Phone Number: 11/06/2020, 9:42 AM   Clinical Narrative:     Patient request outpatient thereapy versus HH. He has a little dog that will often get in the way. He has been to [facility] outpatient therapy on [address]. NO other DME needed, has walker at home.   Final next level of care: Home/Self Care     Patient Goals and CMS Choice        Discharge Placement               DC home with outpatient PT        Discharge Plan and Services                          HH Arranged:  (elects home ouptiant therapy)          Social Determinants of Health (SDOH) Interventions     Readmission Risk Interventions Readmission Risk Prevention Plan 02/20/2020 12/03/2019  Transportation Screening Complete Complete  Medication Review Press photographer) Complete Complete  PCP or Specialist appointment within 3-5 days of discharge - Complete  HRI or [place] Complete Complete  SW Recovery Care/Counseling Consult Complete Complete  Palliative Care Screening Not Applicable Not [place] Not Applicable Not Applicable  Some recent data might be hidden

## 2020-11-06 NOTE — Discharge Summary (Signed)
Physician Discharge Summary  Patient ID: Ryan Copeland MRN: [id number] DOB/AGE: 10/20/1950 70 y.o.  Admit date: 11/02/2020 Discharge date: 11/06/2020  Admission Diagnoses:  Discharge Diagnoses:  Active Problems:   Community acquired pneumonia   Septic shock [facility])   Discharged Condition: fair  Hospital Course:  Admitted with septic shock due to multifocal pneumonia requiring norephinephrine. He was started on vancomycin, zosyn. Cultures unrevealing, clinically improved and narrowed to augmentin to complete 7 day course end date 9/25. Midodrine dose increased to 15 mg TID. After discussion with outpatient oncologist, arranged to have IVIG administered on day of discharge. Developed asymptomatic sinus bradycardia (HR 48-52) toward end of IVIG infusion. I discussed with Ryan Copeland and his wife that we could keep him in the hospital another day and tinker with the midodrine to find minimum effective dose for his chronic hypotension while also avoiding bradycardia. He and his wife declined to stay, appears to be decisional and is aware of the risk of a life-threatening arrhythmia. They will try to arrange an appointment with cardiology on Monday.    Consults: None  Significant Diagnostic Studies:  CT Chest 9/19 with multifocal pneumonia BCx 9/19 NGTD UCx 9/19 NGTD MRSA nare swab neg  Treatments:  Zosyn narrowed to 7 day course of augmentin  Discharge Exam: Blood pressure 101/64, pulse (!) 53, temperature 97.8 F (36.6 C), temperature source Oral, resp. rate 17, weight 81.4 kg, SpO2 96 %. General appearance: 70 y.o., male, NAD, conversant  Eyes: anicteric sclerae, moist conjunctivae; no lid-lag; PERRL, tracking appropriately HENT: NCAT; dry MM Neck: Trachea midline; no lymphadenopathy, no JVD Lungs: Crackles in left base, with normal respiratory effort CV: bradycardic, RR, no MRGs  Abdomen: Soft, non-tender; non-distended, BS present  Extremities: No peripheral edema, radial and DP  pulses present bilaterally  Skin: Normal temperature, turgor and texture; no rash Neuro: Alert and oriented to person and place, a little inattentive but redirectable, no focal deficit   Disposition:  Home with home health PT  Medications: See accompanying encounter medication reconciliation  Signed: Maryjane Hurter 11/06/2020, 2:30 PM

## 2020-11-06 NOTE — Progress Notes (Signed)
NAME:  Ryan Copeland, MRN:  [id number], DOB:  [birth year], LOS: 4 ADMISSION DATE:  11/02/2020, CONSULTATION DATE:  11/02/2020 REFERRING MD:  Dr. 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  Admitted with septic shock requiring vasopressor support.   Recent hospitalizations in August for pneumonia and UTI and treated for LE cellulitis in September.   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 9/20  Passed SLP-> started on diet.  Midodrine restarted.  Weaning NE but remains on low dose.  Mental status much improved.  Vanc stopped.   9/21 NE weaned off, Bps soft overnight to 9/22  Interim History / Subjective:  No acute issues, eager to go home. Really wants a shower. Agrees to stay long enough for IVIG to be arranged for a dose here with pretreatment.  Objective   Blood pressure 120/65, pulse 72, temperature 100 F (37.8 C), temperature source Oral, resp. rate 19, weight 81.4 kg, SpO2 96 %.        Intake/Output Summary (Last 24 hours) at 11/06/2020 4696 Last data filed at 11/06/2020 0500 Gross per 24 hour  Intake 480 ml  Output 2750 ml  Net -2270 ml   Filed Weights   11/06/20 0100  Weight: 81.4 kg    Examination: General  appearance: 70 y.o., male, NAD, conversant  Eyes: anicteric sclerae, tracks HENT: NCAT; dry MM Neck: Trachea midline; no lymphadenopathy, no JVD Lungs: Crackles left base, with normal respiratory effort CV: RRR, no MRGs  Abdomen: Soft, non-tender; non-distended, BS present, SP catheter draining clear yellow urine Extremities: No peripheral edema, warm Psych: Appropriate affect, irritable Neuro: Alert and oriented to person and place, a little inattentive but redirectable and reportedly similar to yesterday, no focal deficit    WBC 1.9, diff pending S Cr stable  Resolved Hospital Problem list    Assessment & Plan:   Septic shock secondary to pneumonia  LLL PNA with suspected involvement in RUL and RLL Centrilobular emphysema   - increased home midodrine to 15 TID - augmentin end date 9/25  Chronic hypotension - as above increased to midodrine 15 TID  DMT2 - controlled.  A1c 4.6 on 9/19 - trend glucose on BMP  CLL  Small cell B-cell lymphoma  Hypogammaglobulinemia - due for IVIG this Friday/ 9/23.  IgG 419.   - hold calquence - discussed with Dr. Alvy Bimler will attempt to arrange for gammunex today with benadryl, solumedrol pretreatment  Mediastinal, left hilar, retroperitoneal and central mesenteric lymphadenopathy - has had ENB postponed x2 given recent hospitalizations.   - has f/u appt with Dr. Valeta Harms on 10/12 to get ENB rescheduled   Hx urinary retention, BPH with LUTS and hypotonic bladder s/p suprapubic catheter 3/22 - continue SP cath  Diarrhea - appears to have chronic diarrhea - no further diarrhea  Best Practice (right  click and "Reselect all SmartList Selections" daily)   Diet/type: Regular consistency (see orders) has been on soft diet given he is edentulous but pt asking for regular food.  Does not wear dentures at home and eats "fine" per pt. DVT prophylaxis: prophylactic heparin  GI prophylaxis: N/A Lines: N/A Foley:  Yes, and it is still needed-  chronic suprapubic catheter  Code Status:  full code Last date of multidisciplinary goals of care discussion:  patient updated on plan of care 9/23.    Possible discharge home later today  Maryjane Hurter, MD [facility] 11/06/2020, 9:25 AM  See Amion for pager If no response to pager, please call PCCM consult pager After 7:00 pm call Elink

## 2020-11-07 LAB — CULTURE, BLOOD (ROUTINE X 2)
Culture: NO GROWTH
Culture: NO GROWTH
Special Requests: ADEQUATE

## 2020-11-10 ENCOUNTER — Telehealth: Payer: Self-pay

## 2020-11-10 NOTE — Telephone Encounter (Signed)
Called and given below message. He verbalized understanding. He has appt scheduled with pulmonologist on 10/12.

## 2020-11-10 NOTE — Telephone Encounter (Signed)
He called and left a message. He received IVIG while in the hospital on 9/23.   He is wanting to schedule his next appt at [facility] for IVIG.

## 2020-11-10 NOTE — Telephone Encounter (Signed)
I sent LOS to schedule next IVIG on 10/21 He needs to call pulmonologist Dr. Valeta Harms to reschedule bronchoscopy and evaluation

## 2020-11-11 ENCOUNTER — Encounter: Payer: Self-pay | Admitting: Physical Therapy

## 2020-11-11 ENCOUNTER — Ambulatory Visit: Payer: Medicare Other | Attending: Hematology and Oncology | Admitting: Physical Therapy

## 2020-11-11 ENCOUNTER — Telehealth: Payer: Self-pay | Admitting: Hematology and Oncology

## 2020-11-11 ENCOUNTER — Other Ambulatory Visit: Payer: Self-pay

## 2020-11-11 DIAGNOSIS — C3492 Malignant neoplasm of unspecified part of left bronchus or lung: Secondary | ICD-10-CM | POA: Diagnosis present

## 2020-11-11 DIAGNOSIS — M6281 Muscle weakness (generalized): Secondary | ICD-10-CM | POA: Insufficient documentation

## 2020-11-11 DIAGNOSIS — R2689 Other abnormalities of gait and mobility: Secondary | ICD-10-CM | POA: Insufficient documentation

## 2020-11-11 DIAGNOSIS — R262 Difficulty in walking, not elsewhere classified: Secondary | ICD-10-CM | POA: Diagnosis present

## 2020-11-11 NOTE — Therapy (Signed)
[place], [place], [address] Phone: [phone]   Fax:  [phone]  Physical Therapy Evaluation  Patient Details  Name: Ryan Copeland MRN: [id number] Date of Birth: May 07, 1950 Referring Provider (PT): Alvy Bimler   Encounter Date: 11/11/2020   PT End of Session - 11/11/20 1444     Visit Number 1    Number of Visits 13    Date for PT Re-Evaluation 12/23/20    PT Start Time 2458    PT Stop Time 1444    PT Time Calculation (min) 49 min    Activity Tolerance Patient tolerated treatment well    Behavior During Therapy [facility] for tasks assessed/performed             Past Medical History:  Diagnosis Date   Anxiety 01/30/2014   Back injury    lower disc   CAD (coronary artery disease)    CLL (chronic lymphocytic leukemia) ([place]) 03/18/2011   COPD (chronic obstructive pulmonary disease) ([place])    DM type 2 (diabetes mellitus, type 2) ([place])    ECRB (extensor carpi radialis brevis) tenosynovitis    Foley catheter in place 11/2019   last changed 04-14-2020   GERD (gastroesophageal reflux disease)    takes Nexium if needed   History of blood transfusion 2015   Hyperlipidemia    Insomnia    Long-term current use of intravenous immunoglobulin (IVIG)    q month ivig   MI, acute, non ST segment elevation ([place]) 06/28/2009   with stenting of the LAD   Neuromuscular disorder ([place])    peripheral neuropathy both all toes   Pneumonia 11/2019   PVD (peripheral vascular disease) ([place])    Thrombocytopenia ([place])    Tobacco abuse     Past Surgical History:  Procedure Laterality Date   [place] N/A 09/26/2014   Procedure: Left Heart Cath and Coronary Angiography;  Surgeon: Peter M Martinique, MD;  Location: [facility];  Service: Cardiovascular;  Laterality: N/A;   carpel tunnel release Left 04-1989   carpel tunnel release  Right 01-1989    CHOLECYSTECTOMY  2007   [place]   CORONARY STENT PLACEMENT  06/2009   stent to lad   CYSTOSCOPY N/A 04/20/2020   Procedure: [place];  Surgeon: Janith Lima, MD;  Location: [facility];  Service: Urology;  Laterality: N/A;  ONLY NEEDS 30 MIN   femoral stents  09/2014   iliac stent   IR CV LINE INJECTION  08/18/2017   IR CV LINE INJECTION  09/01/2017   IR CV LINE INJECTION  02/02/2018   LATERAL EPICONDYLE RELEASE Left 02/12/2014   Procedure: LEFT ELBOW DEBRIDEMENT WITH TENDON REPAIR ;  Surgeon: Lorn Junes, MD;  Location: [facility];  Service: Orthopedics;  Laterality: Left;   LEFT CAI STENT/PTA AND POPLITEAL ARTERY/TIBIAL THROMBECTOMY      LEFT HEART CATHETERIZATION WITH CORONARY ANGIOGRAM N/A 08/26/2011   Procedure: LEFT HEART CATHETERIZATION WITH CORONARY ANGIOGRAM;  Surgeon: Peter M Martinique, MD;  Location: [facility];  Service: Cardiovascular;  Laterality: N/A;   PERIPHERAL VASCULAR CATHETERIZATION N/A 01/01/2015   Procedure: Abdominal Aortogram;  Surgeon: Conrad Lodi, MD;  Location: [facility];  Service: Cardiovascular;  Laterality: N/A;   port a cath insertion  2014   right    TARSAL TUNNEL RELEASE Bilateral 08-2007    There were no vitals filed  for this visit.    Subjective Assessment - 11/11/20 1357     Subjective I was hospitilized in [place] for 2 weeks total and I only got out of the bed once. Before that hospitilization I had a little weakness but not much. Now it is all I can do to climb the stairs from the basement to our living room. I have to use those steps because that is how we go in and out from the car. I had a fall at the hospital during my stay but I haven't had once since. He had to be transferred to a different hospital because when he fell he had a laceration his spleen.    Pertinent History non small cell carcinoma of lung, small cell B cell lymphedema of lymph nodes of multiple sites, pancytopenia, bilateral LE  edema, debility, COPD, DMT2, chronic foley, GERD, CAD, HLD, previous MI, PVD, chronic hypotension, chronic lymphocytic leukemia    Patient Stated Goals to get back to normal- as normal as normal can be    Currently in Pain? No/denies    Pain Score 0-No pain                [facility] PT Assessment - 11/11/20 0001       Assessment   Medical Diagnosis chronic lymphocytic leukemia    Referring Provider (PT) Alvy Bimler    Onset Date/Surgical Date 03/17/10    Hand Dominance Right    Prior Therapy none      Precautions   Precautions Other (comment)    Precaution Comments active cancer      Restrictions   Weight Bearing Restrictions No      Balance Screen   Has the patient fallen in the past 6 months Yes    How many times? 1   while pt was hospitilized in Aug 2022 and lacerated his spleen   Has the patient had a decrease in activity level because of a fear of falling?  No    Is the patient reluctant to leave their home because of a fear of falling?  No      Home Ecologist residence    Living Arrangements Spouse/significant other    Available Help at Discharge Family    Type of [place] Two level      Prior Function   Level of [place] Retired    Leisure pt was very active prior to [place] in Aug 2022, he was able to build a retaining wall by himself      Cognition   Overall Cognitive Status Within Functional Limits for tasks assessed      Functional Tests   Functional tests Sit to Stand      Sit to Stand   Comments 30 sec sit to stand: 8 reps and pt unable to stand without using UEs for support      Posture/Postural Control   Posture/Postural Control Postural limitations    Postural Limitations Rounded Shoulders;Forward head      Strength   Right Shoulder Flexion 3/5    Right Shoulder ABduction 4+/5    Left Shoulder Flexion 3/5    Left Shoulder ABduction 4+/5    Right Hip Flexion 3+/5     Right Hip Extension 2+/5    Right Hip ABduction 2+/5    Left Hip Flexion 3+/5    Left Hip Extension 2+/5    Left Hip ABduction 2+/5  Right Knee Flexion 3+/5    Right Knee Extension 4/5    Left Knee Flexion 3+/5    Left Knee Extension 4/5    Right Ankle Dorsiflexion 3/5    Left Ankle Dorsiflexion 3/5      Ambulation/Gait   Ambulation/Gait Yes    Ambulation/Gait Assistance 6: Modified independent (Device/Increase time)    Ambulation Distance (Feet) 10 Feet    Gait Pattern Step-through pattern;Decreased stride length;Right foot flat;Left foot flat;Wide base of support    Gait Comments pt reports he ascends steps with step 2 gait pattern using hand rail to get himself up                        Objective measurements completed on examination: See above findings.                PT Education - 11/11/20 1500     Education Details practice standing from chair using only 1 HHA instead of 2    Person(s) Educated Patient    Methods Explanation    Comprehension Verbalized understanding                 PT Long Term Goals - 11/11/20 1456       PT LONG TERM GOAL #1   Title Pt will be able to sit to stand from a chair 8 times in 30 seconds without use of UEs to decrease fall risk.    Baseline has to use UEs    Time 6    Period Weeks    Status New    Target Date 12/23/20      PT LONG TERM GOAL #2   Title Pt will demonstrate 4/5 bilateral hip extensor strength to help decrease fall risk    Baseline 2+/5    Time 6    Period Weeks    Status New    Target Date 12/23/20      PT LONG TERM GOAL #3   Title Pt will demonstrate 5/5 bilateral hip flexor strength to decrease fall risk    Baseline 3/5    Time 6    Period Weeks    Status New    Target Date 12/23/20      PT LONG TERM GOAL #4   Title Pt will demonstrate 5/5 bilateral shoulder flexion strength to allow pt to return to PLOF where he was able to lift 50 lb bags of gravel.    Baseline 3/5     Time 6    Period Weeks    Status New    Target Date 12/23/20      PT LONG TERM GOAL #5   Title Pt will be able to get in his truck without increased difficulty.    Baseline pt has difficulty    Time 6    Period Weeks    Status New    Target Date 12/23/20      Additional Long Term Goals   Additional Long Term Goals Yes      PT LONG TERM GOAL #6   Title Pt will be able to ascend 1 flight of steps with step through gait pattern and only 1 hand hold assist to decrease fall risk    Baseline step to gait pattern    Time 6    Period Weeks    Status New    Target Date 12/23/20      PT LONG TERM GOAL #7   Title Pt will  be independent in a home exercise program for long term stretching and strengthening    Time 6    Period Weeks    Target Date 12/23/20                    Plan - 11/11/20 1445     Clinical Impression Statement Pt reports to PT with increased muscle weakness and difficulty walking. Pt has a complicated medical history including: non small cell carcinoma of L lung, small cell B cell lymphoma of lymph nodes of multiple sites, chronic lymphocytic leukemia, pancytopenia, bilateral LE edema, debility, COPD, DMT2, chronic foley, GERD, CAD, HLD, prior MI, PVD, and chronic hypotension. Pt has also had several recent hospitilizations secondary to sepsis recently with one last 2 weeks. Pt reports prior to the hospitilizations he was putting up a retaining wall and now is unable to lift a 50lb bag of gravel. He has difficulty getting in to his truck and fatigues quickly while walking. He has difficulty ascending steps at home and uses a step to gait pattern and relies on the handrail. He is unable to stand from a chair without use of UEs for support. Manual muscle testing today revelaed increased weakness in bilateral LEs with hip extensors being a 2+/5 and hip flexors a 3/5. All LE muscles are weak as well as shoulder muscles. Pt would benefit from skilled PT services to  improve UE and LE strength, decrease fall risk, improve balance, improve gait pattern and stair ambulation.    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, chronic lymphocytic leukemia, pancytopenia, bilateral LE edema, debility, COPD, DMT2, chronic foley, GERD, CAD, HLD, prior MI, PVD, and chronic hypotension    Examination-Activity Limitations Lift;Stairs;Carry;Transfers    Examination-Participation Restrictions Laundry;Cleaning;Shop;Community Activity;Yard Work    Merchant navy officer Evolving/Moderate complexity    Clinical Decision Making Moderate    Rehab Potential Good    PT Frequency 2x / week    PT Duration 6 weeks    PT Treatment/Interventions ADLs/Self Care Home Management;Therapeutic activities;Therapeutic exercise;Gait training;Stair training;Functional mobility training;Balance training;Neuromuscular re-education;Patient/family education;Manual techniques    PT Next Visit Plan begin LE strengthening especially hip extensors and flexors and ankle DF, work on gait and transfers, stair ambulation, high level balance, once leg strength addressed maybe add edema of LEs to POC for pt get compression garments?    PT Home Exercise Plan work on decreasing reliance on hands during sit to stand    Consulted and Agree with Plan of Care Patient             Patient will benefit from skilled therapeutic intervention in order to improve the following deficits and impairments:  Pain, Postural dysfunction, Impaired UE functional use, Decreased strength, Decreased activity tolerance, Difficulty walking, Decreased endurance, Decreased balance, Abnormal gait, Decreased mobility  Visit Diagnosis: Muscle weakness (generalized)  Difficulty in walking, not elsewhere classified  Other abnormalities of gait and mobility  Non-small cell carcinoma of left lung  [facility])     Problem List Patient Active Problem List   Diagnosis Date Noted   Bilateral leg edema 10/27/2020   Physical debility 10/27/2020   Lung nodule 09/23/2020   Non-small cell carcinoma of lung, left ([place]) 09/18/2020   Head and neck lymphadenopathy 27/07/2374   Complicated UTI (urinary tract infection) 07/18/2020   Gum lesion 05/29/2020   Acute lower UTI 02/16/2020  Bladder pain 02/16/2020   Chronic hypotension 02/16/2020   Vitamin B12 deficiency 01/01/2020   Hypotension due to drugs 01/01/2020   Protein-calorie malnutrition, moderate ([place]) 01/01/2020   Septic shock ([place]) 12/15/2019   Community acquired pneumonia 11/28/2019   Cerebral vascular disease 11/25/2019   CIDP (chronic inflammatory demyelinating polyneuropathy) ([place]) 11/25/2019   Other fatigue 10/25/2019   Deficiency anemia 10/25/2019   Dizziness 10/22/2019   Gait abnormality 10/22/2019   Hx of nonmelanoma skin cancer 03/15/2019   Essential hypertension 04/24/2018   Influenza A 04/17/2018   Sepsis ([place]) 04/17/2018   Chronic diastolic CHF (congestive heart failure) ([place]) 04/17/2018   Skin rash 08/07/2017   Port-A-Cath in place 08/04/2017   Chronic apical periodontitis 04/26/2017   Retained dental roots 04/26/2017   Dental caries 04/26/2017   Chronic periodontitis 04/26/2017   Loose, teeth 04/26/2017   Peripheral polyneuropathy 04/14/2017   COPD mixed type ([place]) 02/23/2017   Chronic sinusitis 01/27/2017   Splenomegaly, congestive, chronic 12/29/2016   Diarrhea 12/01/2016   Poor dentition 12/01/2016   Benign mole 12/01/2016   Pancytopenia, acquired ([place]) 11/03/2016   Odynophagia 01/15/2016   Polycythemia, secondary 06/26/2015   Quality of life palliative care encounter 06/26/2015   Atherosclerosis of extremity with intermittent claudication ([place]) 01/23/2015   Lateral epicondylitis of left elbow    ECRB (extensor carpi radialis brevis) tenosynovitis    Anxiety 01/30/2014   Preoperative clearance  01/30/2014   Thrombocytopenia ([place]) 01/30/2014   Diabetes mellitus without complication ([place]) 29/56/2130   Hypogammaglobulinemia, acquired ([place]) 12/26/2012   Hereditary and idiopathic peripheral neuropathy 08/20/2012   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[place]) 08/20/2012   CAD (coronary artery disease) 07/29/2011   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 ([place]) 03/18/2011   MI, acute, non ST segment elevation ([facility])    Hyperlipidemia    PVD (peripheral vascular disease) ([place])    GERD 09/27/2008   SHOULDER STRAIN, RIGHT 09/27/2008    Allyson Sabal [place], PT 11/11/2020, 3:03 PM  [place], [place], [address] Phone: [phone]   Fax:  [phone]  Name: KENTARO ALEWINE MRN: [id number] Date of Birth: 10-18-1950  Manus Gunning, PT 11/11/20 3:03 PM

## 2020-11-11 NOTE — Telephone Encounter (Signed)
Scheduled per sch msg. Called, not able to leave msg. Mailed printout

## 2020-11-15 ENCOUNTER — Other Ambulatory Visit: Payer: Self-pay | Admitting: Internal Medicine

## 2020-11-15 DIAGNOSIS — R053 Chronic cough: Secondary | ICD-10-CM

## 2020-11-18 ENCOUNTER — Other Ambulatory Visit (HOSPITAL_COMMUNITY): Payer: Self-pay

## 2020-11-18 ENCOUNTER — Telehealth: Payer: Self-pay

## 2020-11-18 DIAGNOSIS — C8308 Small cell B-cell lymphoma, lymph nodes of multiple sites: Secondary | ICD-10-CM

## 2020-11-18 MED ORDER — CALQUENCE 100 MG PO TABS
100.0000 mg | ORAL_TABLET | Freq: Two times a day (BID) | ORAL | 1 refills | Status: DC
  Filled 2020-11-18: qty 60, fill #0
  Filled 2020-11-27 (×2): qty 60, 30d supply, fill #0
  Filled 2020-12-24: qty 60, 30d supply, fill #1

## 2020-11-18 MED FILL — Acalabrutinib Cap 100 MG: ORAL | 30 days supply | Qty: 60 | Fill #6 | Status: CN

## 2020-11-20 ENCOUNTER — Other Ambulatory Visit (HOSPITAL_COMMUNITY): Payer: Self-pay

## 2020-11-20 NOTE — Telephone Encounter (Signed)
Oral Chemotherapy Pharmacist Encounter    Dosage formulation change required for Calquence (acalabrutinib). Manufacturer is phasing out previous dosage forms of capsules and changing to tablet formulation. All future prescriptions sent will need to be tablet formulation due to inability to obtain capsules.    Patient's most recent prescription for Calquence has been switched to tablet formulation to be dispensed form the [facility].   Drema Halon, PharmD Hematology/Oncology Clinical Pharmacist [facility] [phone] 11/20/2020 10:29 AM

## 2020-11-24 ENCOUNTER — Other Ambulatory Visit: Payer: Self-pay

## 2020-11-24 ENCOUNTER — Ambulatory Visit: Payer: Medicare Other | Attending: Hematology and Oncology

## 2020-11-24 ENCOUNTER — Ambulatory Visit: Payer: Medicare Other | Admitting: Internal Medicine

## 2020-11-24 DIAGNOSIS — M6281 Muscle weakness (generalized): Secondary | ICD-10-CM | POA: Diagnosis not present

## 2020-11-24 DIAGNOSIS — C3492 Malignant neoplasm of unspecified part of left bronchus or lung: Secondary | ICD-10-CM | POA: Insufficient documentation

## 2020-11-24 DIAGNOSIS — R262 Difficulty in walking, not elsewhere classified: Secondary | ICD-10-CM | POA: Insufficient documentation

## 2020-11-24 DIAGNOSIS — R2689 Other abnormalities of gait and mobility: Secondary | ICD-10-CM | POA: Insufficient documentation

## 2020-11-24 NOTE — Patient Instructions (Addendum)
Cancer Rehab [phone] HIP: Flexion Standing    Holding onto counter lift leg straight in front keeping knee straight. Slow and controlled! _10__ reps per set, _2-3__ sets per day. Then repeat with other leg.   Hip Extension (Standing)    Stand with support at counter. Squeeze pelvic floor and hold so as not to twist hips and don't lean forward.  Move right leg backward with straight knee. Slow and controlled! Repeat _10__ times. Do _2-3__ times a day. Repeat with other leg.  Hip Abduction (Standing)    Stand with support. Squeeze pelvic floor and hold. Lift right leg out to side, keeping toe forward.  Repeat _10__ times. Do _2-3__ times a day. Repeat with other leg.   Mini Squat: Double Leg    With feet shoulder width apart, reach forward for balance and do a mini squat. Keep knees in line with second toe. Knees do not go past toes. Repeat __10_ times per set. Do _1-2__ sets per session.  Heel Raise: Bilateral (Standing)       Stand near counter for fingertip support if needed. Rise on balls of feet. Repeat __10-20__ times per set. Do _1-2___ sets per session. Do __2__ sessions per day.  SINGLE LIMB STANCE    Stand at counter (corner if you have one) for minimal arm support. Raise leg. Hold _10-20__ seconds. Repeat with other leg.  Repeat _3-5__ reps per set, _2-3__ sets per day.  Tandem Stance    Stand at counter (corner if you have one). Right foot in front of left, heel touching toe both feet "straight ahead". Stand on Foot Triangle of Support with both feet. Balance in this position _10-20__ seconds. Do with left foot in front of right. Also try walking along counter in heel-toe fashion.   Step-Up: Forward    Move step-stool to counter or hold rail if at steps, but as little as able. Step up forward keeping opposite foot off step. Keep pelvis level and back straight. Hold the single leg stand for 3 seconds, then come back down slowly.  Do _10__  times, do _1-2_ sets, on each leg, _1-2__ times per day.  Wall Push-Up: Double Arm    Stand _2-3__ feet from wall with both hands on wall. Perform a push-up. Repeat _10__ times per set. Do _1-2__ sets per session.  Bridging    Slowly raise buttocks from floor, keeping stomach tight. Repeat __15-20__ times per set. Do __1-2__ sets per session. Do __2__ sessions per day.  Over Head Pull: Narrow and Wide Grip     On back, knees bent, feet flat, band across thighs, elbows straight but relaxed. Pull hands apart (start). Keeping elbows straight, bring arms up and over head, hands toward floor. Keep pull steady on band. Hold momentarily. Return slowly, keeping pull steady, back to start. Then do same with a wider grip on the band (past shoulder width) Repeat _5-10__ times. Band color __red____   Side Pull: Double Arm   On back, knees bent, feet flat. Arms perpendicular to body, shoulder level, elbows straight but relaxed. Pull arms out to sides, elbows straight. Resistance band comes across collarbones, hands toward floor. Hold momentarily. Slowly return to starting position. Repeat _5-10__ times. Band color _red____   Sword   On back, knees bent, feet flat, left hand on left hip, right hand above left. Pull right arm DIAGONALLY (hip to shoulder) across chest. Bring right arm along head toward floor. Hold momentarily. Slowly return to starting position. Repeat _5-10__  times. Do with left arm. Band color _red_____   Shoulder Rotation: Double Arm   On back, knees bent, feet flat, elbows tucked at sides, bent 90, hands palms up. Pull hands apart and down toward floor, keeping elbows near sides. Hold momentarily. Slowly return to starting position. Repeat _5-10__ times. Band color __red____

## 2020-11-24 NOTE — Therapy (Signed)
[facility] @ [place], [place], [address] Phone: [phone]   Fax:  [phone]  Physical Therapy Treatment  Patient Details  Name: Ryan Copeland MRN: [id number] Date of Birth: [birth year] Referring Provider (PT): Alvy Bimler   Encounter Date: 11/24/2020   PT End of Session - 11/24/20 1106     Visit Number 2    Number of Visits 13    Date for PT Re-Evaluation 12/23/20    PT Start Time 1005    PT Stop Time 1105    PT Time Calculation (min) 60 min    Activity Tolerance Patient tolerated treatment well    Behavior During Therapy [facility] for tasks assessed/performed             Past Medical History:  Diagnosis Date   Anxiety 01/30/2014   Back injury    lower disc   CAD (coronary artery disease)    CLL (chronic lymphocytic leukemia) ([place]) 03/18/2011   COPD (chronic obstructive pulmonary disease) ([place])    DM type 2 (diabetes mellitus, type 2) ([place])    ECRB (extensor carpi radialis brevis) tenosynovitis    Foley catheter in place 11/2019   last changed 04-14-2020   GERD (gastroesophageal reflux disease)    takes Nexium if needed   History of blood transfusion 2015   Hyperlipidemia    Insomnia    Long-term current use of intravenous immunoglobulin (IVIG)    q month ivig   MI, acute, non ST segment elevation ([place]) 06/28/2009   with stenting of the LAD   Neuromuscular disorder ([place])    peripheral neuropathy both all toes   Pneumonia 11/2019   PVD (peripheral vascular disease) ([place])    Thrombocytopenia ([place])    Tobacco abuse     Past Surgical History:  Procedure Laterality Date   [place] N/A 09/26/2014   Procedure: Left Heart Cath and Coronary Angiography;  Surgeon: Peter M Martinique, MD;  Location: [facility];  Service: Cardiovascular;  Laterality: N/A;   carpel tunnel release Left 04-1989   carpel tunnel release  Right 01-1989    CHOLECYSTECTOMY  2007   [place]   CORONARY STENT PLACEMENT  06/2009   stent to lad   CYSTOSCOPY N/A 04/20/2020   Procedure: [place];  Surgeon: Janith Lima, MD;  Location: [facility];  Service: Urology;  Laterality: N/A;  ONLY NEEDS 30 MIN   femoral stents  09/2014   iliac stent   IR CV LINE INJECTION  08/18/2017   IR CV LINE INJECTION  09/01/2017   IR CV LINE INJECTION  02/02/2018   LATERAL EPICONDYLE RELEASE Left 02/12/2014   Procedure: LEFT ELBOW DEBRIDEMENT WITH TENDON REPAIR ;  Surgeon: Lorn Junes, MD;  Location: [facility];  Service: Orthopedics;  Laterality: Left;   LEFT CAI STENT/PTA AND POPLITEAL ARTERY/TIBIAL THROMBECTOMY      LEFT HEART CATHETERIZATION WITH CORONARY ANGIOGRAM N/A 08/26/2011   Procedure: LEFT HEART CATHETERIZATION WITH CORONARY ANGIOGRAM;  Surgeon: Peter M Martinique, MD;  Location[facility];  Service: Cardiovascular;  Laterality: N/A;   PERIPHERAL VASCULAR CATHETERIZATION N/A 01/01/2015   Procedure: Abdominal Aortogram;  Surgeon: Conrad East Feliciana, MD;  Location: [facility];  Service: Cardiovascular;  Laterality: N/A;   port a cath insertion  2014   right    TARSAL TUNNEL RELEASE Bilateral 08-2007    There  were no vitals filed for this visit.   Subjective Assessment - 11/24/20 1012     Subjective I want to get stronger so I can get around better. I can tell I am getting a bit better since I was in the hospital though because in the morning before I get too tired I can go up/down my basement stairs better and get in/out of my truck better. I also was able to use my chainsaw for about 30 mins yesterday.    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 No/denies                               [facility] Adult PT Treatment/Exercise - 11/24/20 0001       Neuro Re-ed    Neuro Re-ed Details  In // bars for heel-toe walking 4x each way;      Knee/Hip Exercises: Aerobic   Nustep Level 5, x7 mins with therapist monitoring pt throughout for fatigue      Knee/Hip Exercises: Standing   Heel Raises Both;10 reps    Hip Flexion Stengthening;Right;Left;10 reps   in // bars for all standing activities   Hip Flexion Limitations Returning therapist demo for all exs today and VCs for correct technqiue/LE positioning    Hip Abduction Stengthening;Right;Left;10 reps    Hip Extension Stengthening;Right;Left;10 reps    Functional Squat 10 reps    Functional Squat Limitations Mod cuing for correct technique      Knee/Hip Exercises: Supine   Bridges Strengthening;Both;15 reps      Shoulder Exercises: Supine   Horizontal ABduction Strengthening;Both;10 reps;Theraband    Theraband Level (Shoulder Horizontal ABduction) Level 2 (Red)    Horizontal ABduction Limitations Pt returned therapist demo for each supine scap exs    External Rotation Strengthening;Both;10 reps;Theraband    Theraband Level (Shoulder External Rotation) Level 2 (Red)    Flexion Strengthening;Both;5 reps;Theraband   Narrow and Wide Grip, 5x each   Theraband Level (Shoulder Flexion) Level 2 (Red)    Diagonals Strengthening;Right;Left;5 reps;Theraband    Theraband Level (Shoulder Diagonals) Level 2 (Red)      Shoulder Exercises: Standing   Other Standing Exercises Wall Push ups x10, these challenging for pt                     PT Education - 11/24/20 1100     Education Details Standing bil LE exs, balance activities, and supine postural strength    Person(s) Educated Patient    Methods Explanation;Demonstration;Verbal cues;Handout    Comprehension Verbalized understanding;Returned demonstration;Need  further instruction                 PT Long Term Goals - 11/11/20 1456       PT LONG TERM GOAL #1   Title Pt will be able to sit to stand from a chair 8 times in 30 seconds without use of UEs to decrease fall risk.    Baseline has to use UEs    Time 6    Period Weeks    Status New    Target Date 12/23/20  PT LONG TERM GOAL #2   Title Pt will demonstrate 4/5 bilateral hip extensor strength to help decrease fall risk    Baseline 2+/5    Time 6    Period Weeks    Status New    Target Date 12/23/20      PT LONG TERM GOAL #3   Title Pt will demonstrate 5/5 bilateral hip flexor strength to decrease fall risk    Baseline 3/5    Time 6    Period Weeks    Status New    Target Date 12/23/20      PT LONG TERM GOAL #4   Title Pt will demonstrate 5/5 bilateral shoulder flexion strength to allow pt to return to PLOF where he was able to lift 50 lb bags of gravel.    Baseline 3/5    Time 6    Period Weeks    Status New    Target Date 12/23/20      PT LONG TERM GOAL #5   Title Pt will be able to get in his truck without increased difficulty.    Baseline pt has difficulty    Time 6    Period Weeks    Status New    Target Date 12/23/20      Additional Long Term Goals   Additional Long Term Goals Yes      PT LONG TERM GOAL #6   Title Pt will be able to ascend 1 flight of steps with step through gait pattern and only 1 hand hold assist to decrease fall risk    Baseline step to gait pattern    Time 6    Period Weeks    Status New    Target Date 12/23/20      PT LONG TERM GOAL #7   Title Pt will be independent in a home exercise program for long term stretching and strengthening    Time 6    Period Weeks    Target Date 12/23/20                   Plan - 11/24/20 1106     Clinical Impression Statement First session of strengthening exercises for bil LE's and UE's, also balance asticivities. Mr. Weaver did well with these with only taking 1 seated rest  break during session and reports not feeling very fatigued by end of session. In addition to adding these exs to his HEP also spent time encouraging him to begin a daily walking routine for about 10 mins 2x/day and he reports can do this on his road. Pt did require multiple VCs for correct technique and erect posture throughout session today but was ale to correct this with cuing. Pt seemed to feel encouraged by all he was able to achieve at this session. He was also able to sit to stand 2x from mat table at end of session with no UE support.    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, chronic lymphocytic leukemia, pancytopenia, bilateral LE edema, debility, COPD, DMT2, chronic foley, GERD, CAD, HLD, prior MI, PVD, and chronic hypotension    Examination-Activity Limitations Lift;Stairs;Carry;Transfers    Examination-Participation Restrictions Laundry;Cleaning;Shop;Community Activity;Yard Work    Stability/Clinical Decision Making Evolving/Moderate complexity    Rehab Potential Good    PT Frequency 2x / week    PT Duration 6 weeks  PT Treatment/Interventions ADLs/Self Care Home Management;Therapeutic activities;Therapeutic exercise;Gait training;Stair training;Functional mobility training;Balance training;Neuromuscular re-education;Patient/family education;Manual techniques    PT Next Visit Plan Cont LE strengthening especially hip extensors and flexors and ankle DF, work on gait and transfers, stair ambulation, high level balance, once leg strength addressed maybe add edema of LEs to POC for pt get compression garments?    PT Home Exercise Plan work on decreasing reliance on hands during sit to stand; bil LE and UE strength and balance activities    Consulted and Agree with Plan of Care Patient             Patient will benefit from skilled therapeutic intervention  in order to improve the following deficits and impairments:  Pain, Postural dysfunction, Impaired UE functional use, Decreased strength, Decreased activity tolerance, Difficulty walking, Decreased endurance, Decreased balance, Abnormal gait, Decreased mobility  Visit Diagnosis: Muscle weakness (generalized)  Difficulty in walking, not elsewhere classified  Other abnormalities of gait and mobility  Non-small cell carcinoma of left lung [facility])     Problem List Patient Active Problem List   Diagnosis Date Noted   Bilateral leg edema 10/27/2020   Physical debility 10/27/2020   Lung nodule 09/23/2020   Non-small cell carcinoma of lung, left ([place]) 09/18/2020   Head and neck lymphadenopathy 94/17/4081   Complicated UTI (urinary tract infection) 07/18/2020   Gum lesion 05/29/2020   Acute lower UTI 02/16/2020   Bladder pain 02/16/2020   Chronic hypotension 02/16/2020   Vitamin B12 deficiency 01/01/2020   Hypotension due to drugs 01/01/2020   Protein-calorie malnutrition, moderate ([place]) 01/01/2020   Septic shock ([place]) 12/15/2019   Community acquired pneumonia 11/28/2019   Cerebral vascular disease 11/25/2019   CIDP (chronic inflammatory demyelinating polyneuropathy) ([place]) 11/25/2019   Other fatigue 10/25/2019   Deficiency anemia 10/25/2019   Dizziness 10/22/2019   Gait abnormality 10/22/2019   Hx of nonmelanoma skin cancer 03/15/2019   Essential hypertension 04/24/2018   Influenza A 04/17/2018   Sepsis ([place]) 04/17/2018   Chronic diastolic CHF (congestive heart failure) ([place]) 04/17/2018   Skin rash 08/07/2017   Port-A-Cath in place 08/04/2017   Chronic apical periodontitis 04/26/2017   Retained dental roots 04/26/2017   Dental caries 04/26/2017   Chronic periodontitis 04/26/2017   Loose, teeth 04/26/2017   Peripheral polyneuropathy 04/14/2017   COPD mixed type ([place]) 02/23/2017   Chronic sinusitis 01/27/2017   Splenomegaly, congestive, chronic 12/29/2016   Diarrhea 12/01/2016    Poor dentition 12/01/2016   Benign mole 12/01/2016   Pancytopenia, acquired ([place]) 11/03/2016   Odynophagia 01/15/2016   Polycythemia, secondary 06/26/2015   Quality of life palliative care encounter 06/26/2015   Atherosclerosis of extremity with intermittent claudication ([place]) 01/23/2015   Lateral epicondylitis of left elbow    ECRB (extensor carpi radialis brevis) tenosynovitis    Anxiety 01/30/2014   Preoperative clearance 01/30/2014   Thrombocytopenia ([place]) 01/30/2014   Diabetes mellitus without complication ([place]) 44/81/8563   Hypogammaglobulinemia, acquired ([place]) 12/26/2012   Hereditary and idiopathic peripheral neuropathy 08/20/2012   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 @ [place] [address]  [place], [place], [address] Phone: [phone]   Fax:  [phone]  Name: SEVERN GODDARD MRN: [id number] Date of Birth: June 11, 1950

## 2020-11-25 ENCOUNTER — Encounter: Payer: Self-pay | Admitting: Pulmonary Disease

## 2020-11-25 ENCOUNTER — Ambulatory Visit (INDEPENDENT_AMBULATORY_CARE_PROVIDER_SITE_OTHER): Payer: Medicare Other | Admitting: Pulmonary Disease

## 2020-11-25 VITALS — BP 118/56 | HR 69 | Temp 98.0°F | Ht 72.0 in | Wt 164.6 lb

## 2020-11-25 DIAGNOSIS — R911 Solitary pulmonary nodule: Secondary | ICD-10-CM | POA: Diagnosis not present

## 2020-11-25 DIAGNOSIS — Z87891 Personal history of nicotine dependence: Secondary | ICD-10-CM

## 2020-11-25 DIAGNOSIS — D801 Nonfamilial hypogammaglobulinemia: Secondary | ICD-10-CM

## 2020-11-25 DIAGNOSIS — C9110 Chronic lymphocytic leukemia of B-cell type not having achieved remission: Secondary | ICD-10-CM

## 2020-11-25 DIAGNOSIS — J181 Lobar pneumonia, unspecified organism: Secondary | ICD-10-CM

## 2020-11-25 DIAGNOSIS — R599 Enlarged lymph nodes, unspecified: Secondary | ICD-10-CM | POA: Diagnosis not present

## 2020-11-25 NOTE — Progress Notes (Signed)
Synopsis: Referred in October 2022 for lung nodule by [name]., PA-C  Subjective:   PATIENT ID: Ryan Copeland GENDER: male DOB: 12-28-1950, MRN: [id number]  Chief Complaint  Patient presents with   Follow-up    Pt. Wants to talk about getting a biopsy done and wants to go over pet scan results.     70 yo PMH, CAD, CLL, COPD, HLD. Follows with oncology. Originally referred to see me for lung nodule. He had to reschedule due to being admitted to [facility]. Related to UTI and sepsis and PNA.  From a respiratory standpoint patient is doing better.  Unfortunately was recently hospitalized in the middle of September for community-acquired pneumonia.  He has office appointments have been rescheduled several times and now he is able to see [place] and is doing better.   Past Medical History:  Diagnosis Date   Anxiety 01/30/2014   Back injury    lower disc   CAD (coronary artery disease)    CLL (chronic lymphocytic leukemia) ([place]) 03/18/2011   COPD (chronic obstructive pulmonary disease) (HCC)    DM type 2 (diabetes mellitus, type 2) (HCC)    ECRB (extensor carpi radialis brevis) tenosynovitis    Foley catheter in place 11/2019   last changed 04-14-2020   GERD (gastroesophageal reflux disease)    takes Nexium if needed   History of blood transfusion 2015   Hyperlipidemia    Insomnia    Long-term current use of intravenous immunoglobulin (IVIG)    q month ivig   MI, acute, non ST segment elevation ([place]) 06/28/2009   with stenting of the LAD   Neuromuscular disorder ([place])    peripheral neuropathy both all toes   Pneumonia 11/2019   PVD (peripheral vascular disease) (HCC)    Thrombocytopenia ([place])    Tobacco abuse      Family History  Problem Relation Age of Onset   Lung cancer Mother 84   Cancer Mother        lung   Heart failure Father 66   Heart disease Father      Past Surgical History:  Procedure Laterality Date   [place] N/A 09/26/2014   Procedure: Left Heart Cath and Coronary Angiography;  Surgeon: Peter M Martinique, MD;  Location: [facility];  Service: Cardiovascular;  Laterality: N/A;   carpel tunnel release Left 04-1989   carpel tunnel release  Right 01-1989   CHOLECYSTECTOMY  2007   [place]   CORONARY STENT PLACEMENT  06/2009   stent to lad   CYSTOSCOPY N/A 04/20/2020   Procedure: [place];  Surgeon: Janith Lima, MD;  Location: [facility];  Service: Urology;  Laterality: N/A;  ONLY NEEDS 30 MIN   femoral stents  09/2014   iliac stent   IR CV LINE INJECTION  08/18/2017   IR CV LINE INJECTION  09/01/2017   IR CV LINE INJECTION  02/02/2018   LATERAL EPICONDYLE RELEASE Left 02/12/2014   Procedure: LEFT ELBOW DEBRIDEMENT WITH TENDON REPAIR ;  Surgeon: Lorn Junes, MD;  Location: [facility];  Service: Orthopedics;  Laterality: Left;   LEFT CAI STENT/PTA AND POPLITEAL ARTERY/TIBIAL THROMBECTOMY      LEFT HEART CATHETERIZATION WITH CORONARY ANGIOGRAM N/A 08/26/2011   Procedure: LEFT HEART CATHETERIZATION WITH CORONARY ANGIOGRAM;  Surgeon: Peter M Martinique, MD;  Location: [facility];  Service: Cardiovascular;  Laterality: N/A;  PERIPHERAL VASCULAR CATHETERIZATION N/A 01/01/2015   Procedure: Abdominal Aortogram;  Surgeon: Conrad Makaha, MD;  Location: [facility];  Service: Cardiovascular;  Laterality: N/A;   port a cath insertion  2014   right    TARSAL TUNNEL RELEASE Bilateral 08-2007    Social History   Socioeconomic History   Marital status: Married    Spouse name: [name]   Number of children: 1   Years of education: [employer] education level: Not on file  Occupational History   Occupation: retired    Fish farm manager: [employer]    Comment: [employer]  Tobacco Use   Smoking status: Former    Packs/day: 1.00    Years: 44.00    Pack years: 44.00    Types: Cigarettes    Quit date: 04/23/2016    Years since  quitting: 4.5   Smokeless tobacco: Never  Vaping Use   Vaping Use: Never used  Substance and Sexual Activity   Alcohol use: Not Currently    Alcohol/week: 0.0 standard drinks   Drug use: No   Sexual activity: Yes  Other Topics Concern   Not on file  Social History Narrative   Patient lives at home with wife.   Caffeine Use: 15 cups of caffeine weekly   Social Determinants of Health   Financial Resource Strain: Not on file  Food Insecurity: No Food Insecurity   Worried About Charity fundraiser in the Last Year: Never true   Ran Out of Food in the Last Year: Never true  Transportation Needs: No Transportation Needs   Lack of Transportation (Medical): No   Lack of Transportation (Non-Medical): No  Physical Activity: Not on file  Stress: Not on file  Social Connections: Not on file  Intimate Partner Violence: Not on file     Allergies  Allergen Reactions   Immune Globulin Hives and Rash    Pt reports hives/rash on body with IV Privigen. He was changed to IV Gamunex-C and had many infusions without adverse side effects.    Codeine Hives    Pt states he can take a few, more reaction with extended doses.     Outpatient Medications Prior to Visit  Medication Sig Dispense Refill   acalabrutinib (CALQUENCE) 100 MG capsule TAKE 1 CAPSULE BY MOUTH TWICE A DAY (Patient taking differently: Take 100 mg by mouth 2 (two) times daily.) 60 capsule 11   Acalabrutinib Maleate (CALQUENCE) 100 MG TABS Take 1 tablet (100 mg) by mouth 2 (two) times daily. 60 tablet 1   acetaminophen (TYLENOL) 325 MG tablet Take 2 tablets (650 mg total) by mouth every 6 (six) hours as needed for moderate pain or fever. 30 tablet 0   acyclovir (ZOVIRAX) 400 MG tablet TAKE 1 TABLET BY MOUTH TWICE A DAY (Patient taking differently: Take 400 mg by mouth 2 (two) times daily.) 180 tablet 1   allopurinol (ZYLOPRIM) 300 MG tablet Take 1 tablet (300 mg total) by mouth daily. 90 tablet 1   ALPRAZolam (XANAX) 0.5 MG tablet  Take 0.5 mg by mouth at bedtime as needed for anxiety.     b complex vitamins tablet Take 1 tablet by mouth daily.      benzonatate (TESSALON) 100 MG capsule TAKE TWO CAPSULES BY MOUTH THREE TIMES A DAY AS NEEDED 90 capsule 1   Cholecalciferol 25 MCG (1000 UT) tablet Take 1,000 Units by mouth daily.      diphenoxylate-atropine (LOMOTIL) 2.5-0.025 MG tablet TAKE 1 TABLET BY  MOUTH 4 (FOUR) TIMES DAILY AS NEEDED FOR DIARRHEA OR LOOSE STOOLS. 60 tablet 1   famotidine (PEPCID) 20 MG tablet TAKE 1 TABLET BY MOUTH TWICE A DAY (Patient taking differently: Take 20 mg by mouth daily as needed for heartburn.) 180 tablet 1   HYDROcodone-acetaminophen (NORCO) 10-325 MG tablet Take 1 tablet by mouth every 6 (six) hours as needed for moderate pain.     lidocaine-prilocaine (EMLA) cream Apply 1 application topically as needed (prior chemo/IBIG). 30 g 9   loperamide (IMODIUM) 2 MG capsule Take 2 capsules (4 mg total) by mouth 4 (four) times daily. (Patient taking differently: Take 4 mg by mouth 4 (four) times daily as needed for diarrhea or loose stools.) 120 capsule 9   meclizine (ANTIVERT) 12.5 MG tablet Take 12.5 mg by mouth 3 (three) times daily as needed for dizziness.     metFORMIN (GLUCOPHAGE-XR) 500 MG 24 hr tablet Take 500 mg by mouth every evening.     midodrine (PROAMATINE) 5 MG tablet Take 3 tablets (15 mg total) by mouth 3 (three) times daily with meals. 270 tablet 2   montelukast (SINGULAIR) 10 MG tablet TAKE 1 TABLET BY MOUTH EVERY DAY (Patient taking differently: Take 10 mg by mouth at bedtime.) 90 tablet 1   nitroGLYCERIN (NITROSTAT) 0.4 MG SL tablet PLACE 1 TABLET UNDER THE TONGUE EVERY 5 MINUTES X 3 DOSES AS NEEDED FOR CHEST PAIN *MAX 3 DOSES* 25 tablet 7   pregabalin (LYRICA) 200 MG capsule TAKE 1 CAPSULE (200 MG TOTAL) BY MOUTH 3 (THREE) TIMES DAILY. 270 capsule 1   rosuvastatin (CRESTOR) 5 MG tablet Take 1 tablet (5 mg total) by mouth daily. 90 tablet 3   sildenafil (VIAGRA) 100 MG tablet Take  0.5 tablets (50 mg total) by mouth as needed for erectile dysfunction (30 mintues prior to sexual intercourse). 10 tablet 3   tamsulosin (FLOMAX) 0.4 MG CAPS capsule Take 1 capsule (0.4 mg total) by mouth daily. 30 capsule 0   zolpidem (AMBIEN) 10 MG tablet Take 10 mg by mouth at bedtime.     Facility-Administered Medications Prior to Visit  Medication Dose Route Frequency Provider Last Rate Last Admin   0.9 %  sodium chloride infusion   Intravenous Continuous Alvy Bimler, Ni, MD 50 mL/hr at 03/07/14 1005 New Bag at 03/07/14 1005   sodium chloride 0.9 % injection 10 mL  10 mL Intracatheter PRN Marcy Panning, MD   10 mL at 08/22/12 1721    Review of Systems  Constitutional:  Positive for malaise/fatigue. Negative for chills, fever and weight loss.  HENT:  Negative for hearing loss, sore throat and tinnitus.   Eyes:  Negative for blurred vision and double vision.  Respiratory:  Positive for shortness of breath. Negative for cough, hemoptysis, sputum production, wheezing and stridor.   Cardiovascular:  Negative for chest pain, palpitations, orthopnea, leg swelling and PND.  Gastrointestinal:  Negative for abdominal pain, constipation, diarrhea, heartburn, nausea and vomiting.  Genitourinary:  Negative for dysuria, hematuria and urgency.  Musculoskeletal:  Negative for joint pain and myalgias.  Skin:  Negative for itching and rash.  Neurological:  Negative for dizziness, tingling, weakness and headaches.  Endo/Heme/Allergies:  Negative for environmental allergies. Does not bruise/bleed easily.  Psychiatric/Behavioral:  Negative for depression. The patient is not nervous/anxious and does not have insomnia.   All other systems reviewed and are negative.   Objective:  Physical Exam Vitals reviewed.  Constitutional:      General: He is not in acute distress.  Appearance: He is well-developed.  HENT:     Head: Normocephalic and atraumatic.  Eyes:     General: No scleral icterus.     Conjunctiva/sclera: Conjunctivae normal.     Pupils: Pupils are equal, round, and reactive to light.  Neck:     Vascular: No JVD.     Trachea: No tracheal deviation.  Cardiovascular:     Rate and Rhythm: Normal rate and regular rhythm.     Heart sounds: Normal heart sounds. No murmur heard. Pulmonary:     Effort: Pulmonary effort is normal. No tachypnea, accessory muscle usage or respiratory distress.     Breath sounds: No stridor. No wheezing, rhonchi or rales.     Comments: Diminished in the bases compared to the upper lobes.  No significant rhonchi or wheeze Abdominal:     General: Bowel sounds are normal. There is no distension.     Palpations: Abdomen is soft.     Tenderness: There is no abdominal tenderness.  Musculoskeletal:        General: No tenderness.     Cervical back: Neck supple.  Lymphadenopathy:     Cervical: No cervical adenopathy.  Skin:    General: Skin is warm and dry.     Capillary Refill: Capillary refill takes less than 2 seconds.     Findings: No rash.  Neurological:     Mental Status: He is alert and oriented to person, place, and time.  Psychiatric:        Behavior: Behavior normal.     Vitals:   11/25/20 1501  BP: (!) 118/56  Pulse: 69  Temp: 98 F (36.7 C)  TempSrc: Oral  SpO2: 96%  Weight: 164 lb 9.6 oz (74.7 kg)  Height: 6' (1.829 m)   96% on RA BMI Readings from Last 3 Encounters:  11/25/20 22.32 kg/m  11/06/20 24.34 kg/m  10/27/20 23.76 kg/m   Wt Readings from Last 3 Encounters:  11/25/20 164 lb 9.6 oz (74.7 kg)  11/06/20 179 lb 7.3 oz (81.4 kg)  10/27/20 175 lb 3.2 oz (79.5 kg)     CBC    Component Value Date/Time   WBC 1.9 (L) 11/06/2020 0303   WBC 1.9 (L) 11/06/2020 0303   RBC 3.35 (L) 11/06/2020 0303   RBC 3.33 (L) 11/06/2020 0303   HGB 10.0 (L) 11/06/2020 0303   HGB 10.0 (L) 11/06/2020 0303   HGB 13.5 05/18/2018 1122   HGB 13.6 01/26/2017 0940   HCT 30.9 (L) 11/06/2020 0303   HCT 30.9 (L) 11/06/2020 0303    HCT 39.5 01/26/2017 0940   PLT 36 (L) 11/06/2020 0303   PLT 35 (L) 11/06/2020 0303   PLT 71 (L) 05/18/2018 1122   PLT 84 (L) 01/26/2017 0940   MCV 92.2 11/06/2020 0303   MCV 92.8 11/06/2020 0303   MCV 98.0 01/26/2017 0940   MCH 29.9 11/06/2020 0303   MCH 30.0 11/06/2020 0303   MCHC 32.4 11/06/2020 0303   MCHC 32.4 11/06/2020 0303   RDW 20.7 (H) 11/06/2020 0303   RDW 20.5 (H) 11/06/2020 0303   RDW 14.9 (H) 01/26/2017 0940   LYMPHSABS 0.6 (L) 11/06/2020 0303   LYMPHSABS 0.4 (L) 01/26/2017 0940   MONOABS 0.2 11/06/2020 0303   MONOABS 0.4 01/26/2017 0940   EOSABS 0.1 11/06/2020 0303   EOSABS 0.1 01/26/2017 0940   BASOSABS 0.0 11/06/2020 0303   BASOSABS 0.0 01/26/2017 0940     Chest Imaging: 11/02/2020 CT chest abdomen pelvis: Lower lobe pneumonia dense consolidation  costs, also has left pulmonary nodule which is slowly began enlarged.  He had nuclear medicine pet imaging back in August which reveals PET avid uptake in the nodule concerning for malignancy. The patient's images have been independently reviewed by me.    Pulmonary Functions Testing Results: PFT Results Latest Ref Rng & Units 06/27/2019  FVC-Pre L 3.62  FVC-Predicted Pre % 77  FVC-Post L 3.70  FVC-Predicted Post % 79  Pre FEV1/FVC % % 75  Post FEV1/FCV % % 76  FEV1-Pre L 2.72  FEV1-Predicted Pre % 78  FEV1-Post L 2.82    FeNO:   Pathology:   Echocardiogram:   Heart Catheterization:     Assessment & Plan:     ICD-10-CM   1. Lung nodule  R91.1 CT Super D Chest Wo Contrast    Procedural/ Surgical Case Request: [place]    Ambulatory referral to Pulmonology    2. CLL (chronic lymphocytic leukemia) (HCC)  C91.10     3. 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 Lobar pneumonia, unspecified organism ([place])  J18.1     6. History of tobacco abuse  Z87.891       Discussion:  This is a 70 year old gentleman, history of  tobacco abuse, left lower lobe small PET avid lung nodule concerning for stage I disease.  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  Additional time in the office today spent reviewing patient's documentation from 2 previous hospitalizations prior to being seen today in the office.  Plan: Today in the office we discussed risk benefits and alternatives to proceed with bronchoscopy. I do think his lower lobe lung nodule is likely a stage I lung cancer. We will plan for robotic assisted navigation to the nodule. He needs repeat CT scan of the chest as the last CT scan has a dense lower lobe pneumonia. We need to ensure that that pneumonia is getting better and we need to clear CT scan to be used for his navigation planning purposes. We have ordered a repeat super D. He is not on any blood thinners or antiplatelets. Patient has been tentatively scheduled for 12/08/2020.    Current Outpatient Medications:    acalabrutinib (CALQUENCE) 100 MG capsule, TAKE 1 CAPSULE BY MOUTH TWICE A DAY (Patient taking differently: Take 100 mg by mouth 2 (two) times daily.), Disp: 60 capsule, Rfl: 11   Acalabrutinib Maleate (CALQUENCE) 100 MG TABS, Take 1 tablet (100 mg) by mouth 2 (two) times daily., Disp: 60 tablet, Rfl: 1   acetaminophen (TYLENOL) 325 MG tablet, Take 2 tablets (650 mg total) by mouth every 6 (six) hours as needed for moderate pain or fever., Disp: 30 tablet, Rfl: 0   acyclovir (ZOVIRAX) 400 MG tablet, TAKE 1 TABLET BY MOUTH TWICE A DAY (Patient taking differently: Take 400 mg by mouth 2 (two) times daily.), Disp: 180 tablet, Rfl: 1   allopurinol (ZYLOPRIM) 300 MG tablet, Take 1 tablet (300 mg total) by mouth daily., Disp: 90 tablet, Rfl: 1   ALPRAZolam (XANAX) 0.5 MG tablet, Take 0.5 mg by mouth at bedtime as needed for anxiety., Disp: , Rfl:    b complex vitamins tablet, Take 1 tablet by mouth daily. , Disp: , Rfl:    benzonatate (TESSALON) 100 MG capsule, TAKE TWO CAPSULES BY MOUTH  THREE TIMES A DAY AS NEEDED, Disp: 90 capsule, Rfl: 1   Cholecalciferol 25 MCG (1000 UT) tablet, Take  1,000 Units by mouth daily. , Disp: , Rfl:    diphenoxylate-atropine (LOMOTIL) 2.5-0.025 MG tablet, TAKE 1 TABLET BY MOUTH 4 (FOUR) TIMES DAILY AS NEEDED FOR DIARRHEA OR LOOSE STOOLS., Disp: 60 tablet, Rfl: 1   famotidine (PEPCID) 20 MG tablet, TAKE 1 TABLET BY MOUTH TWICE A DAY (Patient taking differently: Take 20 mg by mouth daily as needed for heartburn.), Disp: 180 tablet, Rfl: 1   HYDROcodone-acetaminophen (NORCO) 10-325 MG tablet, Take 1 tablet by mouth every 6 (six) hours as needed for moderate pain., Disp: , Rfl:    lidocaine-prilocaine (EMLA) cream, Apply 1 application topically as needed (prior chemo/IBIG)., Disp: 30 g, Rfl: 9   loperamide (IMODIUM) 2 MG capsule, Take 2 capsules (4 mg total) by mouth 4 (four) times daily. (Patient taking differently: Take 4 mg by mouth 4 (four) times daily as needed for diarrhea or loose stools.), Disp: 120 capsule, Rfl: 9   meclizine (ANTIVERT) 12.5 MG tablet, Take 12.5 mg by mouth 3 (three) times daily as needed for dizziness., Disp: , Rfl:    metFORMIN (GLUCOPHAGE-XR) 500 MG 24 hr tablet, Take 500 mg by mouth every evening., Disp: , Rfl:    midodrine (PROAMATINE) 5 MG tablet, Take 3 tablets (15 mg total) by mouth 3 (three) times daily with meals., Disp: 270 tablet, Rfl: 2   montelukast (SINGULAIR) 10 MG tablet, TAKE 1 TABLET BY MOUTH EVERY DAY (Patient taking differently: Take 10 mg by mouth at bedtime.), Disp: 90 tablet, Rfl: 1   nitroGLYCERIN (NITROSTAT) 0.4 MG SL tablet, PLACE 1 TABLET UNDER THE TONGUE EVERY 5 MINUTES X 3 DOSES AS NEEDED FOR CHEST PAIN *MAX 3 DOSES*, Disp: 25 tablet, Rfl: 7   pregabalin (LYRICA) 200 MG capsule, TAKE 1 CAPSULE (200 MG TOTAL) BY MOUTH 3 (THREE) TIMES DAILY., Disp: 270 capsule, Rfl: 1   rosuvastatin (CRESTOR) 5 MG tablet, Take 1 tablet (5 mg total) by mouth daily., Disp: 90 tablet, Rfl: 3   sildenafil (VIAGRA) 100 MG  tablet, Take 0.5 tablets (50 mg total) by mouth as needed for erectile dysfunction (30 mintues prior to sexual intercourse)., Disp: 10 tablet, Rfl: 3   tamsulosin (FLOMAX) 0.4 MG CAPS capsule, Take 1 capsule (0.4 mg total) by mouth daily., Disp: 30 capsule, Rfl: 0   zolpidem (AMBIEN) 10 MG tablet, Take 10 mg by mouth at bedtime., Disp: , Rfl:  No current facility-administered medications for this visit.  Facility-Administered Medications Ordered in Other Visits:    0.9 %  sodium chloride infusion, , Intravenous, Continuous, Gorsuch, Ni, MD, Last Rate: 50 mL/hr at 03/07/14 1005, New Bag at 03/07/14 1005   sodium chloride 0.9 % injection 10 mL, 10 mL, Intracatheter, PRN, Marcy Panning, MD, 10 mL at 08/22/12 1721  I spent 42 minutes dedicated to the care of this patient on the date of this encounter to include pre-visit review of records, face-to-face time with the patient discussing conditions above, post visit ordering of testing, clinical documentation with the electronic health record, making appropriate referrals as documented, and communicating necessary findings to members of the patients care team.   [name], DO [facility] 11/25/2020 6:47 PM

## 2020-11-25 NOTE — Patient Instructions (Signed)
Thank you for visiting Dr. Valeta Harms at [facility]. Today we recommend the following:  Orders Placed This Encounter  Procedures   Procedural/ Surgical Case Request: VIDEO BRONCHOSCOPY WITH ENDOBRONCHIAL NAVIGATION   CT Super D Chest Wo Contrast   Ambulatory referral to Pulmonology   Tentative Bronchoscopy date: 12/08/2020  Return in about 19 days (around 12/14/2020) for w/ Eric Form, NP.    Please do your part to reduce the spread of COVID-19.

## 2020-11-25 NOTE — H&P (View-Only) (Signed)
Synopsis: Referred in October 2022 for lung nodule by [name]., PA-C  Subjective:   PATIENT ID: Ryan Copeland GENDER: male DOB: April 12, 1950, MRN: [id number]  Chief Complaint  Patient presents with   Follow-up    Pt. Wants to talk about getting a biopsy done and wants to go over pet scan results.     70 yo PMH, CAD, CLL, COPD, HLD. Follows with oncology. Originally referred to see me for lung nodule. He had to reschedule due to being admitted to [facility]. Related to UTI and sepsis and PNA.  From a respiratory standpoint patient is doing better.  Unfortunately was recently hospitalized in the middle of September for community-acquired pneumonia.  He has office appointments have been rescheduled several times and now he is able to see [place] and is doing better.   Past Medical History:  Diagnosis Date   Anxiety 01/30/2014   Back injury    lower disc   CAD (coronary artery disease)    CLL (chronic lymphocytic leukemia) ([place]) 03/18/2011   COPD (chronic obstructive pulmonary disease) (HCC)    DM type 2 (diabetes mellitus, type 2) (HCC)    ECRB (extensor carpi radialis brevis) tenosynovitis    Foley catheter in place 11/2019   last changed 04-14-2020   GERD (gastroesophageal reflux disease)    takes Nexium if needed   History of blood transfusion 2015   Hyperlipidemia    Insomnia    Long-term current use of intravenous immunoglobulin (IVIG)    q month ivig   MI, acute, non ST segment elevation ([place]) 06/28/2009   with stenting of the LAD   Neuromuscular disorder ([place])    peripheral neuropathy both all toes   Pneumonia 11/2019   PVD (peripheral vascular disease) (HCC)    Thrombocytopenia ([place])    Tobacco abuse      Family History  Problem Relation Age of Onset   Lung cancer Mother 78   Cancer Mother        lung   Heart failure Father 62   Heart disease Father      Past Surgical History:  Procedure Laterality Date   [facility] N/A 09/26/2014   Procedure: Left Heart Cath and Coronary Angiography;  Surgeon: Peter M Martinique, MD;  Location: [facility];  Service: Cardiovascular;  Laterality: N/A;   carpel tunnel release Left 04-1989   carpel tunnel release  Right 01-1989   CHOLECYSTECTOMY  2007   [facility]   CORONARY STENT PLACEMENT  06/2009   stent to lad   CYSTOSCOPY N/A 04/20/2020   Procedure: [place];  Surgeon: Janith Lima, MD;  Location: [facility];  Service: Urology;  Laterality: N/A;  ONLY NEEDS 30 MIN   femoral stents  09/2014   iliac stent   IR CV LINE INJECTION  08/18/2017   IR CV LINE INJECTION  09/01/2017   IR CV LINE INJECTION  02/02/2018   LATERAL EPICONDYLE RELEASE Left 02/12/2014   Procedure: LEFT ELBOW DEBRIDEMENT WITH TENDON REPAIR ;  Surgeon: Lorn Junes, MD;  Location: [facility];  Service: Orthopedics;  Laterality: Left;   LEFT CAI STENT/PTA AND POPLITEAL ARTERY/TIBIAL THROMBECTOMY      LEFT HEART CATHETERIZATION WITH CORONARY ANGIOGRAM N/A 08/26/2011   Procedure: LEFT HEART CATHETERIZATION WITH CORONARY ANGIOGRAM;  Surgeon: Peter M Martinique, MD;  Location: [facility];  Service: Cardiovascular;  Laterality: N/A;  PERIPHERAL VASCULAR CATHETERIZATION N/A 01/01/2015   Procedure: Abdominal Aortogram;  Surgeon: Conrad [place], MD;  Location: [facility];  Service: Cardiovascular;  Laterality: N/A;   port a cath insertion  2014   right    TARSAL TUNNEL RELEASE Bilateral 08-2007    Social History   Socioeconomic History   Marital status: Married    Spouse name: [name]   Number of children: 1   Years of education: [employer] education level: Not on file  Occupational History   Occupation: retired    Fish farm manager: [employer]    Comment: [employer]  Tobacco Use   Smoking status: Former    Packs/day: 1.00    Years: 44.00    Pack years: 44.00    Types: Cigarettes    Quit date: 04/23/2016    Years since  quitting: 4.5   Smokeless tobacco: Never  Vaping Use   Vaping Use: Never used  Substance and Sexual Activity   Alcohol use: Not Currently    Alcohol/week: 0.0 standard drinks   Drug use: No   Sexual activity: Yes  Other Topics Concern   Not on file  Social History Narrative   Patient lives at home with wife.   Caffeine Use: 15 cups of caffeine weekly   Social Determinants of Health   Financial Resource Strain: Not on file  Food Insecurity: No Food Insecurity   Worried About Charity fundraiser in the Last Year: Never true   Ran Out of Food in the Last Year: Never true  Transportation Needs: No Transportation Needs   Lack of Transportation (Medical): No   Lack of Transportation (Non-Medical): No  Physical Activity: Not on file  Stress: Not on file  Social Connections: Not on file  Intimate Partner Violence: Not on file     Allergies  Allergen Reactions   Immune Globulin Hives and Rash    Pt reports hives/rash on body with IV Privigen. He was changed to IV Gamunex-C and had many infusions without adverse side effects.    Codeine Hives    Pt states he can take a few, more reaction with extended doses.     Outpatient Medications Prior to Visit  Medication Sig Dispense Refill   acalabrutinib (CALQUENCE) 100 MG capsule TAKE 1 CAPSULE BY MOUTH TWICE A DAY (Patient taking differently: Take 100 mg by mouth 2 (two) times daily.) 60 capsule 11   Acalabrutinib Maleate (CALQUENCE) 100 MG TABS Take 1 tablet (100 mg) by mouth 2 (two) times daily. 60 tablet 1   acetaminophen (TYLENOL) 325 MG tablet Take 2 tablets (650 mg total) by mouth every 6 (six) hours as needed for moderate pain or fever. 30 tablet 0   acyclovir (ZOVIRAX) 400 MG tablet TAKE 1 TABLET BY MOUTH TWICE A DAY (Patient taking differently: Take 400 mg by mouth 2 (two) times daily.) 180 tablet 1   allopurinol (ZYLOPRIM) 300 MG tablet Take 1 tablet (300 mg total) by mouth daily. 90 tablet 1   ALPRAZolam (XANAX) 0.5 MG tablet  Take 0.5 mg by mouth at bedtime as needed for anxiety.     b complex vitamins tablet Take 1 tablet by mouth daily.      benzonatate (TESSALON) 100 MG capsule TAKE TWO CAPSULES BY MOUTH THREE TIMES A DAY AS NEEDED 90 capsule 1   Cholecalciferol 25 MCG (1000 UT) tablet Take 1,000 Units by mouth daily.      diphenoxylate-atropine (LOMOTIL) 2.5-0.025 MG tablet TAKE 1 TABLET BY  MOUTH 4 (FOUR) TIMES DAILY AS NEEDED FOR DIARRHEA OR LOOSE STOOLS. 60 tablet 1   famotidine (PEPCID) 20 MG tablet TAKE 1 TABLET BY MOUTH TWICE A DAY (Patient taking differently: Take 20 mg by mouth daily as needed for heartburn.) 180 tablet 1   HYDROcodone-acetaminophen (NORCO) 10-325 MG tablet Take 1 tablet by mouth every 6 (six) hours as needed for moderate pain.     lidocaine-prilocaine (EMLA) cream Apply 1 application topically as needed (prior chemo/IBIG). 30 g 9   loperamide (IMODIUM) 2 MG capsule Take 2 capsules (4 mg total) by mouth 4 (four) times daily. (Patient taking differently: Take 4 mg by mouth 4 (four) times daily as needed for diarrhea or loose stools.) 120 capsule 9   meclizine (ANTIVERT) 12.5 MG tablet Take 12.5 mg by mouth 3 (three) times daily as needed for dizziness.     metFORMIN (GLUCOPHAGE-XR) 500 MG 24 hr tablet Take 500 mg by mouth every evening.     midodrine (PROAMATINE) 5 MG tablet Take 3 tablets (15 mg total) by mouth 3 (three) times daily with meals. 270 tablet 2   montelukast (SINGULAIR) 10 MG tablet TAKE 1 TABLET BY MOUTH EVERY DAY (Patient taking differently: Take 10 mg by mouth at bedtime.) 90 tablet 1   nitroGLYCERIN (NITROSTAT) 0.4 MG SL tablet PLACE 1 TABLET UNDER THE TONGUE EVERY 5 MINUTES X 3 DOSES AS NEEDED FOR CHEST PAIN *MAX 3 DOSES* 25 tablet 7   pregabalin (LYRICA) 200 MG capsule TAKE 1 CAPSULE (200 MG TOTAL) BY MOUTH 3 (THREE) TIMES DAILY. 270 capsule 1   rosuvastatin (CRESTOR) 5 MG tablet Take 1 tablet (5 mg total) by mouth daily. 90 tablet 3   sildenafil (VIAGRA) 100 MG tablet Take  0.5 tablets (50 mg total) by mouth as needed for erectile dysfunction (30 mintues prior to sexual intercourse). 10 tablet 3   tamsulosin (FLOMAX) 0.4 MG CAPS capsule Take 1 capsule (0.4 mg total) by mouth daily. 30 capsule 0   zolpidem (AMBIEN) 10 MG tablet Take 10 mg by mouth at bedtime.     Facility-Administered Medications Prior to Visit  Medication Dose Route Frequency Provider Last Rate Last Admin   0.9 %  sodium chloride infusion   Intravenous Continuous Alvy Bimler, Ni, MD 50 mL/hr at 03/07/14 1005 New Bag at 03/07/14 1005   sodium chloride 0.9 % injection 10 mL  10 mL Intracatheter PRN Marcy Panning, MD   10 mL at 08/22/12 1721    Review of Systems  Constitutional:  Positive for malaise/fatigue. Negative for chills, fever and weight loss.  HENT:  Negative for hearing loss, sore throat and tinnitus.   Eyes:  Negative for blurred vision and double vision.  Respiratory:  Positive for shortness of breath. Negative for cough, hemoptysis, sputum production, wheezing and stridor.   Cardiovascular:  Negative for chest pain, palpitations, orthopnea, leg swelling and PND.  Gastrointestinal:  Negative for abdominal pain, constipation, diarrhea, heartburn, nausea and vomiting.  Genitourinary:  Negative for dysuria, hematuria and urgency.  Musculoskeletal:  Negative for joint pain and myalgias.  Skin:  Negative for itching and rash.  Neurological:  Negative for dizziness, tingling, weakness and headaches.  Endo/Heme/Allergies:  Negative for environmental allergies. Does not bruise/bleed easily.  Psychiatric/Behavioral:  Negative for depression. The patient is not nervous/anxious and does not have insomnia.   All other systems reviewed and are negative.   Objective:  Physical Exam Vitals reviewed.  Constitutional:      General: He is not in acute distress.  Appearance: He is well-developed.  HENT:     Head: Normocephalic and atraumatic.  Eyes:     General: No scleral icterus.     Conjunctiva/sclera: Conjunctivae normal.     Pupils: Pupils are equal, round, and reactive to light.  Neck:     Vascular: No JVD.     Trachea: No tracheal deviation.  Cardiovascular:     Rate and Rhythm: Normal rate and regular rhythm.     Heart sounds: Normal heart sounds. No murmur heard. Pulmonary:     Effort: Pulmonary effort is normal. No tachypnea, accessory muscle usage or respiratory distress.     Breath sounds: No stridor. No wheezing, rhonchi or rales.     Comments: Diminished in the bases compared to the upper lobes.  No significant rhonchi or wheeze Abdominal:     General: Bowel sounds are normal. There is no distension.     Palpations: Abdomen is soft.     Tenderness: There is no abdominal tenderness.  Musculoskeletal:        General: No tenderness.     Cervical back: Neck supple.  Lymphadenopathy:     Cervical: No cervical adenopathy.  Skin:    General: Skin is warm and dry.     Capillary Refill: Capillary refill takes less than 2 seconds.     Findings: No rash.  Neurological:     Mental Status: He is alert and oriented to person, place, and time.  Psychiatric:        Behavior: Behavior normal.     Vitals:   11/25/20 1501  BP: (!) 118/56  Pulse: 69  Temp: 98 F (36.7 C)  TempSrc: Oral  SpO2: 96%  Weight: 164 lb 9.6 oz (74.7 kg)  Height: 6' (1.829 m)   96% on RA BMI Readings from Last 3 Encounters:  11/25/20 22.32 kg/m  11/06/20 24.34 kg/m  10/27/20 23.76 kg/m   Wt Readings from Last 3 Encounters:  11/25/20 164 lb 9.6 oz (74.7 kg)  11/06/20 179 lb 7.3 oz (81.4 kg)  10/27/20 175 lb 3.2 oz (79.5 kg)     CBC    Component Value Date/Time   WBC 1.9 (L) 11/06/2020 0303   WBC 1.9 (L) 11/06/2020 0303   RBC 3.35 (L) 11/06/2020 0303   RBC 3.33 (L) 11/06/2020 0303   HGB 10.0 (L) 11/06/2020 0303   HGB 10.0 (L) 11/06/2020 0303   HGB 13.5 05/18/2018 1122   HGB 13.6 01/26/2017 0940   HCT 30.9 (L) 11/06/2020 0303   HCT 30.9 (L) 11/06/2020 0303    HCT 39.5 01/26/2017 0940   PLT 36 (L) 11/06/2020 0303   PLT 35 (L) 11/06/2020 0303   PLT 71 (L) 05/18/2018 1122   PLT 84 (L) 01/26/2017 0940   MCV 92.2 11/06/2020 0303   MCV 92.8 11/06/2020 0303   MCV 98.0 01/26/2017 0940   MCH 29.9 11/06/2020 0303   MCH 30.0 11/06/2020 0303   MCHC 32.4 11/06/2020 0303   MCHC 32.4 11/06/2020 0303   RDW 20.7 (H) 11/06/2020 0303   RDW 20.5 (H) 11/06/2020 0303   RDW 14.9 (H) 01/26/2017 0940   LYMPHSABS 0.6 (L) 11/06/2020 0303   LYMPHSABS 0.4 (L) 01/26/2017 0940   MONOABS 0.2 11/06/2020 0303   MONOABS 0.4 01/26/2017 0940   EOSABS 0.1 11/06/2020 0303   EOSABS 0.1 01/26/2017 0940   BASOSABS 0.0 11/06/2020 0303   BASOSABS 0.0 01/26/2017 0940     Chest Imaging: 11/02/2020 CT chest abdomen pelvis: Lower lobe pneumonia dense consolidation  costs, also has left pulmonary nodule which is slowly began enlarged.  He had nuclear medicine pet imaging back in August which reveals PET avid uptake in the nodule concerning for malignancy. The patient's images have been independently reviewed by me.    Pulmonary Functions Testing Results: PFT Results Latest Ref Rng & Units 06/27/2019  FVC-Pre L 3.62  FVC-Predicted Pre % 77  FVC-Post L 3.70  FVC-Predicted Post % 79  Pre FEV1/FVC % % 75  Post FEV1/FCV % % 76  FEV1-Pre L 2.72  FEV1-Predicted Pre % 78  FEV1-Post L 2.82    FeNO:   Pathology:   Echocardiogram:   Heart Catheterization:     Assessment & Plan:     ICD-10-CM   1. Lung nodule  R91.1 CT Super D Chest Wo Contrast    Procedural/ Surgical Case Request: Cool    Ambulatory referral to Pulmonology    2. CLL (chronic lymphocytic leukemia) (HCC)  C91.10     3. 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 Lobar pneumonia, unspecified organism ([place])  J18.1     6. History of tobacco abuse  Z87.891       Discussion:  This is a 70 year old gentleman, history of  tobacco abuse, left lower lobe small PET avid lung nodule concerning for stage I disease.  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  Additional time in the office today spent reviewing patient's documentation from 2 previous hospitalizations prior to being seen today in the office.  Plan: Today in the office we discussed risk benefits and alternatives to proceed with bronchoscopy. I do think his lower lobe lung nodule is likely a stage I lung cancer. We will plan for robotic assisted navigation to the nodule. He needs repeat CT scan of the chest as the last CT scan has a dense lower lobe pneumonia. We need to ensure that that pneumonia is getting better and we need to clear CT scan to be used for his navigation planning purposes. We have ordered a repeat super D. He is not on any blood thinners or antiplatelets. Patient has been tentatively scheduled for 12/08/2020.    Current Outpatient Medications:    acalabrutinib (CALQUENCE) 100 MG capsule, TAKE 1 CAPSULE BY MOUTH TWICE A DAY (Patient taking differently: Take 100 mg by mouth 2 (two) times daily.), Disp: 60 capsule, Rfl: 11   Acalabrutinib Maleate (CALQUENCE) 100 MG TABS, Take 1 tablet (100 mg) by mouth 2 (two) times daily., Disp: 60 tablet, Rfl: 1   acetaminophen (TYLENOL) 325 MG tablet, Take 2 tablets (650 mg total) by mouth every 6 (six) hours as needed for moderate pain or fever., Disp: 30 tablet, Rfl: 0   acyclovir (ZOVIRAX) 400 MG tablet, TAKE 1 TABLET BY MOUTH TWICE A DAY (Patient taking differently: Take 400 mg by mouth 2 (two) times daily.), Disp: 180 tablet, Rfl: 1   allopurinol (ZYLOPRIM) 300 MG tablet, Take 1 tablet (300 mg total) by mouth daily., Disp: 90 tablet, Rfl: 1   ALPRAZolam (XANAX) 0.5 MG tablet, Take 0.5 mg by mouth at bedtime as needed for anxiety., Disp: , Rfl:    b complex vitamins tablet, Take 1 tablet by mouth daily. , Disp: , Rfl:    benzonatate (TESSALON) 100 MG capsule, TAKE TWO CAPSULES BY MOUTH  THREE TIMES A DAY AS NEEDED, Disp: 90 capsule, Rfl: 1   Cholecalciferol 25 MCG (1000 UT) tablet, Take  1,000 Units by mouth daily. , Disp: , Rfl:    diphenoxylate-atropine (LOMOTIL) 2.5-0.025 MG tablet, TAKE 1 TABLET BY MOUTH 4 (FOUR) TIMES DAILY AS NEEDED FOR DIARRHEA OR LOOSE STOOLS., Disp: 60 tablet, Rfl: 1   famotidine (PEPCID) 20 MG tablet, TAKE 1 TABLET BY MOUTH TWICE A DAY (Patient taking differently: Take 20 mg by mouth daily as needed for heartburn.), Disp: 180 tablet, Rfl: 1   HYDROcodone-acetaminophen (NORCO) 10-325 MG tablet, Take 1 tablet by mouth every 6 (six) hours as needed for moderate pain., Disp: , Rfl:    lidocaine-prilocaine (EMLA) cream, Apply 1 application topically as needed (prior chemo/IBIG)., Disp: 30 g, Rfl: 9   loperamide (IMODIUM) 2 MG capsule, Take 2 capsules (4 mg total) by mouth 4 (four) times daily. (Patient taking differently: Take 4 mg by mouth 4 (four) times daily as needed for diarrhea or loose stools.), Disp: 120 capsule, Rfl: 9   meclizine (ANTIVERT) 12.5 MG tablet, Take 12.5 mg by mouth 3 (three) times daily as needed for dizziness., Disp: , Rfl:    metFORMIN (GLUCOPHAGE-XR) 500 MG 24 hr tablet, Take 500 mg by mouth every evening., Disp: , Rfl:    midodrine (PROAMATINE) 5 MG tablet, Take 3 tablets (15 mg total) by mouth 3 (three) times daily with meals., Disp: 270 tablet, Rfl: 2   montelukast (SINGULAIR) 10 MG tablet, TAKE 1 TABLET BY MOUTH EVERY DAY (Patient taking differently: Take 10 mg by mouth at bedtime.), Disp: 90 tablet, Rfl: 1   nitroGLYCERIN (NITROSTAT) 0.4 MG SL tablet, PLACE 1 TABLET UNDER THE TONGUE EVERY 5 MINUTES X 3 DOSES AS NEEDED FOR CHEST PAIN *MAX 3 DOSES*, Disp: 25 tablet, Rfl: 7   pregabalin (LYRICA) 200 MG capsule, TAKE 1 CAPSULE (200 MG TOTAL) BY MOUTH 3 (THREE) TIMES DAILY., Disp: 270 capsule, Rfl: 1   rosuvastatin (CRESTOR) 5 MG tablet, Take 1 tablet (5 mg total) by mouth daily., Disp: 90 tablet, Rfl: 3   sildenafil (VIAGRA) 100 MG  tablet, Take 0.5 tablets (50 mg total) by mouth as needed for erectile dysfunction (30 mintues prior to sexual intercourse)., Disp: 10 tablet, Rfl: 3   tamsulosin (FLOMAX) 0.4 MG CAPS capsule, Take 1 capsule (0.4 mg total) by mouth daily., Disp: 30 capsule, Rfl: 0   zolpidem (AMBIEN) 10 MG tablet, Take 10 mg by mouth at bedtime., Disp: , Rfl:  No current facility-administered medications for this visit.  Facility-Administered Medications Ordered in Other Visits:    0.9 %  sodium chloride infusion, , Intravenous, Continuous, Gorsuch, Ni, MD, Last Rate: 50 mL/hr at 03/07/14 1005, New Bag at 03/07/14 1005   sodium chloride 0.9 % injection 10 mL, 10 mL, Intracatheter, PRN, Marcy Panning, MD, 10 mL at 08/22/12 1721  I spent 42 minutes dedicated to the care of this patient on the date of this encounter to include pre-visit review of records, face-to-face time with the patient discussing conditions above, post visit ordering of testing, clinical documentation with the electronic health record, making appropriate referrals as documented, and communicating necessary findings to members of the patients care team.   [name], DO [facility] 11/25/2020 6:47 PM

## 2020-11-26 ENCOUNTER — Ambulatory Visit: Payer: Medicare Other

## 2020-11-26 ENCOUNTER — Other Ambulatory Visit: Payer: Self-pay

## 2020-11-26 ENCOUNTER — Telehealth: Payer: Self-pay | Admitting: Pulmonary Disease

## 2020-11-26 DIAGNOSIS — R262 Difficulty in walking, not elsewhere classified: Secondary | ICD-10-CM

## 2020-11-26 DIAGNOSIS — M6281 Muscle weakness (generalized): Secondary | ICD-10-CM | POA: Diagnosis not present

## 2020-11-26 DIAGNOSIS — R2689 Other abnormalities of gait and mobility: Secondary | ICD-10-CM

## 2020-11-26 DIAGNOSIS — C3492 Malignant neoplasm of unspecified part of left bronchus or lung: Secondary | ICD-10-CM

## 2020-11-26 NOTE — Telephone Encounter (Signed)
Pt was scheduled for 10/25 at 12:20.  I scheduled CT for 10/19 at 3:30 at [facility].  Ryan Copeland is going to have disk sent to [facility].  Pt needs to go for covid test on 10/21.  I have left vm for pt to call me for appt info.

## 2020-11-26 NOTE — Therapy (Signed)
[facility] @ [place], [place], [address] Phone: [phone]   Fax:  [phone]  Physical Therapy Treatment  Patient Details  Name: Ryan Copeland MRN: [id number] Date of Birth: Dec 04, 1950 Referring Provider (PT): Alvy Bimler   Encounter Date: 11/26/2020   PT End of Session - 11/26/20 1102     Visit Number 3    Number of Visits 13    Date for PT Re-Evaluation 12/23/20    PT Start Time 1005    PT Stop Time 1102    PT Time Calculation (min) 57 min    Activity Tolerance Patient tolerated treatment well    Behavior During Therapy [facility] for tasks assessed/performed             Past Medical History:  Diagnosis Date   Anxiety 01/30/2014   Back injury    lower disc   CAD (coronary artery disease)    CLL (chronic lymphocytic leukemia) ([place]) 03/18/2011   COPD (chronic obstructive pulmonary disease) ([place])    DM type 2 (diabetes mellitus, type 2) ([place])    ECRB (extensor carpi radialis brevis) tenosynovitis    Foley catheter in place 11/2019   last changed 04-14-2020   GERD (gastroesophageal reflux disease)    takes Nexium if needed   History of blood transfusion 2015   Hyperlipidemia    Insomnia    Long-term current use of intravenous immunoglobulin (IVIG)    q month ivig   MI, acute, non ST segment elevation ([place]) 06/28/2009   with stenting of the LAD   Neuromuscular disorder ([place])    peripheral neuropathy both all toes   Pneumonia 11/2019   PVD (peripheral vascular disease) ([place])    Thrombocytopenia ([place])    Tobacco abuse     Past Surgical History:  Procedure Laterality Date   [place] N/A 09/26/2014   Procedure: Left Heart Cath and Coronary Angiography;  Surgeon: Peter M Martinique, MD;  Location: [facility];  Service: Cardiovascular;  Laterality: N/A;   carpel tunnel release Left 04-1989   carpel tunnel release  Right 01-1989    CHOLECYSTECTOMY  2007   lapa   CORONARY STENT PLACEMENT  06/2009   stent to lad   CYSTOSCOPY N/A 04/20/2020   Procedure: [place];  Surgeon: Janith Lima, MD;  Location[facility];  Service: Urology;  Laterality: N/A;  ONLY NEEDS 30 MIN   femoral stents  09/2014   iliac stent   IR CV LINE INJECTION  08/18/2017   IR CV LINE INJECTION  09/01/2017   IR CV LINE INJECTION  02/02/2018   LATERAL EPICONDYLE RELEASE Left 02/12/2014   Procedure: LEFT ELBOW DEBRIDEMENT WITH TENDON REPAIR ;  Surgeon: Lorn Junes, MD;  Location: [facility];  Service: Orthopedics;  Laterality: Left;   LEFT CAI STENT/PTA AND POPLITEAL ARTERY/TIBIAL THROMBECTOMY      LEFT HEART CATHETERIZATION WITH CORONARY ANGIOGRAM N/A 08/26/2011   Procedure: LEFT HEART CATHETERIZATION WITH CORONARY ANGIOGRAM;  Surgeon: Peter M Martinique, MD;  Location: [facility] CATH LAB;  Service: Cardiovascular;  Laterality: N/A;   PERIPHERAL VASCULAR CATHETERIZATION N/A 01/01/2015   Procedure: Abdominal Aortogram;  Surgeon: Conrad Liverpool, MD;  Location: [facility];  Service: Cardiovascular;  Laterality: N/A;   port a cath insertion  2014   right    TARSAL TUNNEL RELEASE Bilateral 08-2007    There  were no vitals filed for this visit.   Subjective Assessment - 11/26/20 1008     Subjective I felt pretty good after last visit. I was tired but I had alot of other things I did that day so I wasn't more tired than usual.    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 No/denies                               [facility] Adult PT Treatment/Exercise - 11/26/20 0001       Neuro  Re-ed    Neuro Re-ed Details  In // bars: Front and retro heel-toe walking 2x each, then on balance beam 2x each direction; slow, controlled high knee marching VCs to hold high knee 3 sec each rep; grapevine 2x each direction returning therapist demo, this was very challenging for pt so had him then only crossover in front. Then Resiste Walking with [employer] using orange belt and pt pulled 13 lbs front and then backwards 5x each with CGA throughout      Knee/Hip Exercises: Stretches   Passive Hamstring Stretch Right;Left;2 reps;20 seconds   seated edge of chair after resisted walking   Gastroc Stretch Both;3 reps;20 seconds   in // bars with PF at ramp of bars     Knee/Hip Exercises: Aerobic   Nustep Level 5, x7 mins with therapist monitoring pt throughout for fatigue      Knee/Hip Exercises: Standing   Hip Flexion Stengthening;Right;Left;10 reps   in // bars on Airex with 3# each ankle   Hip Abduction Stengthening;Right;Left;10 reps   in // bars on Airex with 3# each ankle   Abduction Limitations Only required VCs for Rt LE to decr Rt hip er, did well with Lt LE    Hip Extension Stengthening;Right;Left;10 reps   in // bars on Airex with 3# each ankle   Extension Limitations VCs for erect posture and to lift leg until glut contraction felt    Forward Step Up Right;Left;10 reps;Hand Hold: 2;Step Height: 6"   Airex on step   Forward Step Up Limitations Pt required therapist demo for each rep and encouraged to hold each rep in SLS x3 sec each                          PT Long Term Goals - 11/11/20 1456       PT LONG TERM GOAL #1   Title Pt will be able to sit to stand from a chair 8 times in 30 seconds without use of UEs to decrease fall risk.    Baseline has to use UEs    Time 6    Period Weeks    Status New    Target Date 12/23/20      PT LONG TERM GOAL #2   Title Pt will demonstrate 4/5 bilateral hip extensor strength to help decrease fall risk    Baseline  2+/5    Time 6    Period Weeks    Status New    Target Date 12/23/20      PT  LONG TERM GOAL #3   Title Pt will demonstrate 5/5 bilateral hip flexor strength to decrease fall risk    Baseline 3/5    Time 6    Period Weeks    Status New    Target Date 12/23/20      PT LONG TERM GOAL #4   Title Pt will demonstrate 5/5 bilateral shoulder flexion strength to allow pt to return to PLOF where he was able to lift 50 lb bags of gravel.    Baseline 3/5    Time 6    Period Weeks    Status New    Target Date 12/23/20      PT LONG TERM GOAL #5   Title Pt will be able to get in his truck without increased difficulty.    Baseline pt has difficulty    Time 6    Period Weeks    Status New    Target Date 12/23/20      Additional Long Term Goals   Additional Long Term Goals Yes      PT LONG TERM GOAL #6   Title Pt will be able to ascend 1 flight of steps with step through gait pattern and only 1 hand hold assist to decrease fall risk    Baseline step to gait pattern    Time 6    Period Weeks    Status New    Target Date 12/23/20      PT LONG TERM GOAL #7   Title Pt will be independent in a home exercise program for long term stretching and strengthening    Time 6    Period Weeks    Target Date 12/23/20                   Plan - 11/26/20 1102     Clinical Impression Statement Continued with balance and bil LE and UE strengthening activities today. Pt was challenged by all activities and only required 1 seated rest. He reports feeling most challenged by resisted walking and could "feel his muscles tightening up" so incorporated bil LE stretches and encouraged pt to perform this at home throughout day also and he verbalized understanding. Pt does still require multiple VCs for erect posture as he has forward posture but is able to correct this well with cuing.    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, chronic lymphocytic leukemia, pancytopenia, bilateral LE edema, debility, COPD, DMT2, chronic foley, GERD, CAD, HLD, prior MI, PVD, and chronic hypotension    Examination-Activity Limitations Lift;Stairs;Carry;Transfers    Examination-Participation Restrictions Laundry;Cleaning;Shop;Community Activity;Yard Work    [employer]chant [employer] officer Evolving/Moderate complexity    Rehab Potential Good    PT Frequency 2x / week    PT Duration 6 weeks    PT Treatment/Interventions ADLs/Self Care Home Management;Therapeutic activities;Therapeutic exercise;Gait training;Stair training;Functional mobility training;Balance training;Neuromuscular re-education;Patient/family education;Manual techniques    PT Next Visit Plan Cont LE strengthening especially hip extensors and flexors and ankle DF, work on gait and transfers, stair ambulation, high level balance, once leg strength addressed maybe add edema of LEs to POC for pt get compression garments?    PT Home Exercise Plan work on decreasing reliance on hands during sit to stand; bil LE and UE strength and balance activities    Consulted and Agree with Plan of Care Patient  Patient will benefit from skilled therapeutic intervention in order to improve the following deficits and impairments:  Pain, Postural dysfunction, Impaired UE functional use, Decreased strength, Decreased activity tolerance, Difficulty walking, Decreased endurance, Decreased balance, Abnormal gait, Decreased mobility  Visit Diagnosis: Muscle weakness (generalized)  Difficulty in walking, not elsewhere classified  Other abnormalities of gait and mobility  Non-small cell carcinoma of left lung [facility])     Problem List Patient Active Problem List   Diagnosis Date Noted   Bilateral leg edema 10/27/2020   Physical debility 10/27/2020   Lung nodule 09/23/2020   Non-small cell carcinoma of lung, left ([place])  09/18/2020   Head and neck lymphadenopathy 90/38/3338   Complicated UTI (urinary tract infection) 07/18/2020   Gum lesion 05/29/2020   Acute lower UTI 02/16/2020   Bladder pain 02/16/2020   Chronic hypotension 02/16/2020   Vitamin B12 deficiency 01/01/2020   Hypotension due to drugs 01/01/2020   Protein-calorie malnutrition, moderate ([place]) 01/01/2020   Septic shock ([place]) 12/15/2019   Community acquired pneumonia 11/28/2019   Cerebral vascular disease 11/25/2019   CIDP (chronic inflammatory demyelinating polyneuropathy) ([place]) 11/25/2019   Other fatigue 10/25/2019   Deficiency anemia 10/25/2019   Dizziness 10/22/2019   Gait abnormality 10/22/2019   Hx of nonmelanoma skin cancer 03/15/2019   Essential hypertension 04/24/2018   Influenza A 04/17/2018   Sepsis ([place]) 04/17/2018   Chronic diastolic CHF (congestive heart failure) ([place]) 04/17/2018   Skin rash 08/07/2017   Port-A-Cath in place 08/04/2017   Chronic apical periodontitis 04/26/2017   Retained dental roots 04/26/2017   Dental caries 04/26/2017   Chronic periodontitis 04/26/2017   Loose, teeth 04/26/2017   Peripheral polyneuropathy 04/14/2017   COPD mixed type ([place]) 02/23/2017   Chronic sinusitis 01/27/2017   Splenomegaly, congestive, chronic 12/29/2016   Diarrhea 12/01/2016   Poor dentition 12/01/2016   Benign mole 12/01/2016   Pancytopenia, acquired ([place]) 11/03/2016   Odynophagia 01/15/2016   Polycythemia, secondary 06/26/2015   Quality of life palliative care encounter 06/26/2015   Atherosclerosis of extremity with intermittent claudication ([place]) 01/23/2015   Lateral epicondylitis of left elbow    ECRB (extensor carpi radialis brevis) tenosynovitis    Anxiety 01/30/2014   Preoperative clearance 01/30/2014   Thrombocytopenia ([place]) 01/30/2014   Diabetes mellitus without complication ([place]) 32/91/9166   Hypogammaglobulinemia, acquired ([place]) 12/26/2012   Hereditary and idiopathic peripheral neuropathy 08/20/2012    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

## 2020-11-27 ENCOUNTER — Other Ambulatory Visit (HOSPITAL_COMMUNITY): Payer: Self-pay

## 2020-11-27 ENCOUNTER — Telehealth: Payer: Self-pay

## 2020-11-27 NOTE — Telephone Encounter (Signed)
Oral Oncology Patient Advocate Encounter  Prior Authorization for [employer]qu[employer] Tablets has been approved.    PA# [id number] Effective dates: 02/15/20 through 11/27/21  Oral Oncology Clinic will continue to follow.   [facility] Patient [place] Phone [phone] Fax [phone] 11/27/2020 10:13 AM

## 2020-11-27 NOTE — Telephone Encounter (Signed)
Oral Oncology Patient Advocate Encounter   Received notification from [employer] that prior authorization for Calquence Tablets is required.   PA submitted on CoverMyMeds Key [id number] Status is pending   Oral Oncology Clinic will continue to follow.   [place] Patient [place] Phone [phone] Fax [phone] 11/27/2020 10:12 AM

## 2020-11-27 NOTE — Telephone Encounter (Signed)
I spoke to pt's wife per his request and gave her appt info.

## 2020-11-30 ENCOUNTER — Other Ambulatory Visit: Payer: Self-pay

## 2020-11-30 ENCOUNTER — Telehealth: Payer: Self-pay | Admitting: Pulmonary Disease

## 2020-11-30 ENCOUNTER — Ambulatory Visit: Payer: Medicare Other | Admitting: Rehabilitation

## 2020-11-30 DIAGNOSIS — M6281 Muscle weakness (generalized): Secondary | ICD-10-CM

## 2020-11-30 DIAGNOSIS — R262 Difficulty in walking, not elsewhere classified: Secondary | ICD-10-CM

## 2020-11-30 DIAGNOSIS — C3492 Malignant neoplasm of unspecified part of left bronchus or lung: Secondary | ICD-10-CM

## 2020-11-30 DIAGNOSIS — R2689 Other abnormalities of gait and mobility: Secondary | ICD-10-CM

## 2020-11-30 NOTE — Telephone Encounter (Signed)
Pt states he has biopsy on 10/25. Pt is unsure if he can make the covid test at [facility] three days prior due to an infusion he's having that day as well. I did tell pt [facility] is open until 3 and that he could also go to a pharmacy and get PCR Covid test. Pt did not like that answer. Would like nurse to call him with advice

## 2020-11-30 NOTE — Therapy (Signed)
[facility] @ [place], [place], [address] Phone: [phone]   Fax:  [phone]  Physical Therapy Treatment  Patient Details  Name: Ryan Copeland MRN: [id number] Date of Birth: 1950-10-04 Referring Provider (PT): Alvy Bimler   Encounter Date: 11/30/2020   PT End of Session - 11/30/20 1200     Visit Number 4    Number of Visits 13    Date for PT Re-Evaluation 12/23/20    PT Start Time 1100    PT Stop Time 3329    PT Time Calculation (min) 55 min    Activity Tolerance Patient tolerated treatment well    Behavior During Therapy [facility] for tasks assessed/performed             Past Medical History:  Diagnosis Date   Anxiety 01/30/2014   Back injury    lower disc   CAD (coronary artery disease)    CLL (chronic lymphocytic leukemia) ([place]) 03/18/2011   COPD (chronic obstructive pulmonary disease) ([place])    DM type 2 (diabetes mellitus, type 2) ([place])    ECRB (extensor carpi radialis brevis) tenosynovitis    Foley catheter in place 11/2019   last changed 04-14-2020   GERD (gastroesophageal reflux disease)    takes Nexium if needed   History of blood transfusion 2015   Hyperlipidemia    Insomnia    Long-term current use of intravenous immunoglobulin (IVIG)    q month ivig   MI, acute, non ST segment elevation ([place]) 06/28/2009   with stenting of the LAD   Neuromuscular disorder ([place])    peripheral neuropathy both all toes   Pneumonia 11/2019   PVD (peripheral vascular disease) ([place])    Thrombocytopenia ([place])    Tobacco abuse     Past Surgical History:  Procedure Laterality Date   [place] N/A 09/26/2014   Procedure: Left Heart Cath and Coronary Angiography;  Surgeon: Peter M Martinique, MD;  Location: [facility];  Service: Cardiovascular;  Laterality: N/A;   carpel tunnel release Left 04-1989   carpel tunnel release  Right 01-1989    CHOLECYSTECTOMY  2007   lapa   CORONARY STENT PLACEMENT  06/2009   stent to lad   CYSTOSCOPY N/A 04/20/2020   Procedure: [place];  Surgeon: Janith Lima, MD;  Location[facility];  Service: Urology;  Laterality: N/A;  ONLY NEEDS 30 MIN   femoral stents  09/2014   iliac stent   IR CV LINE INJECTION  08/18/2017   IR CV LINE INJECTION  09/01/2017   IR CV LINE INJECTION  02/02/2018   LATERAL EPICONDYLE RELEASE Left 02/12/2014   Procedure: LEFT ELBOW DEBRIDEMENT WITH TENDON REPAIR ;  Surgeon: Lorn Junes, MD;  Location: [facility];  Service: Orthopedics;  Laterality: Left;   LEFT CAI STENT/PTA AND POPLITEAL ARTERY/TIBIAL THROMBECTOMY      LEFT HEART CATHETERIZATION WITH CORONARY ANGIOGRAM N/A 08/26/2011   Procedure: LEFT HEART CATHETERIZATION WITH CORONARY ANGIOGRAM;  Surgeon: Peter M Martinique, MD;  Location: [facility] CATH LAB;  Service: Cardiovascular;  Laterality: N/A;   PERIPHERAL VASCULAR CATHETERIZATION N/A 01/01/2015   Procedure: Abdominal Aortogram;  Surgeon: Conrad [place], MD;  Location: [facility];  Service: Cardiovascular;  Laterality: N/A;   port a cath insertion  2014   right    TARSAL TUNNEL RELEASE Bilateral 08-2007    There  were no vitals filed for this visit.   Subjective Assessment - 11/30/20 1106     Subjective Nothing new.    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 No/denies                               [facility] Adult PT Treatment/Exercise - 11/30/20 0001       Neuro Re-ed    Neuro Re-ed Details  In // bars: balance beam 2x each direction with 3 color discs set up at the end with vcs and which leg to move and which color to touch with HH on the bars PRN. ;  grapevine 2x each direction returning therapist demo, crossover in front only. Then Resisted Walking with [employer] machine using orange belt and elastic cording for resistance      Exercises   Exercises Knee/Hip      Knee/Hip Exercises: Aerobic   Nustep Level 5 UE/LE seat and arms at 12, x9 mins with therapist monitoring pt throughout for fatigue      Knee/Hip Exercises: Standing   Hip Flexion Both;10 reps   3#   Hip Flexion Limitations vcs for small movement    Hip Abduction Both;10 reps   3#   Abduction Limitations vcs for upright posture    Hip Extension Stengthening;10 reps;Both   3#   Extension Limitations VCs for erect posture and to lift leg until glut contraction felt      Knee/Hip Exercises: Seated   Long Arc Quad Both;10 reps    Long Arc Quad Weight 3 lbs.    Sit to Sand 10 reps   seated on plinth and foam - focus on slow sit to stand with knees over toes as pt was attempting to stand and incorporating trunk extension     Knee/Hip Exercises: Supine   Bridges Both;10 reps    Straight Leg Raises 5 sets;Both                          PT Long Term Goals - 11/11/20 1456       PT LONG TERM GOAL #1   Title Pt will be able to sit to stand from a chair 8 times in 30 seconds without use of UEs to decrease fall risk.    Baseline has to use UEs    Time 6    Period Weeks    Status New    Target Date 12/23/20      PT LONG TERM GOAL #2   Title Pt will demonstrate 4/5 bilateral hip extensor strength to help decrease fall risk    Baseline 2+/5    Time 6    Period Weeks    Status New    Target Date 12/23/20      PT LONG TERM GOAL #3   Title Pt will demonstrate 5/5 bilateral hip flexor strength to decrease fall risk    Baseline 3/5    Time 6    Period Weeks    Status New    Target Date 12/23/20      PT LONG TERM GOAL #4   Title Pt will demonstrate 5/5 bilateral shoulder flexion strength to allow pt to return  to PLOF where he was able to lift 50 lb bags  of gravel.    Baseline 3/5    Time 6    Period Weeks    Status New    Target Date 12/23/20      PT LONG TERM GOAL #5   Title Pt will be able to get in his truck without increased difficulty.    Baseline pt has difficulty    Time 6    Period Weeks    Status New    Target Date 12/23/20      Additional Long Term Goals   Additional Long Term Goals Yes      PT LONG TERM GOAL #6   Title Pt will be able to ascend 1 flight of steps with step through gait pattern and only 1 hand hold assist to decrease fall risk    Baseline step to gait pattern    Time 6    Period Weeks    Status New    Target Date 12/23/20      PT LONG TERM GOAL #7   Title Pt will be independent in a home exercise program for long term stretching and strengthening    Time 6    Period Weeks    Target Date 12/23/20                   Plan - 11/30/20 1200     Clinical Impression Statement Continued with balance and LE strengthening.  worked on sit to stand form with pt moving into trunk extension too early with improvements with VCs.  Pt did very well today.    PT Frequency 2x / week    PT Duration 6 weeks    PT Treatment/Interventions ADLs/Self Care Home Management;Therapeutic activities;Therapeutic exercise;Gait training;Stair training;Functional mobility training;Balance training;Neuromuscular re-education;Patient/family education;Manual techniques    PT Next Visit Plan Cont LE strengthening especially hip extensors and flexors and ankle DF, work on gait and transfers, stair ambulation, high level balance, once leg strength addressed maybe add edema of LEs to POC for pt get compression garments?    Consulted and Agree with Plan of Care Patient             Patient will benefit from skilled therapeutic intervention in order to improve the following deficits and impairments:     Visit Diagnosis: Muscle weakness (generalized)  Difficulty in walking, not elsewhere classified  Other abnormalities of  gait and mobility  Non-small cell carcinoma of left lung [facility])     Problem List Patient Active Problem List   Diagnosis Date Noted   Bilateral leg edema 10/27/2020   Physical debility 10/27/2020   Lung nodule 09/23/2020   Non-small cell carcinoma of lung, left ([place]) 09/18/2020   Head and neck lymphadenopathy 65/68/1275   Complicated UTI (urinary tract infection) 07/18/2020   Gum lesion 05/29/2020   Acute lower UTI 02/16/2020   Bladder pain 02/16/2020   Chronic hypotension 02/16/2020   Vitamin B12 deficiency 01/01/2020   Hypotension due to drugs 01/01/2020   Protein-calorie malnutrition, moderate ([place]) 01/01/2020   Septic shock ([place]) 12/15/2019   Community acquired pneumonia 11/28/2019   Cerebral vascular disease 11/25/2019   CIDP (chronic inflammatory demyelinating polyneuropathy) ([place]) 11/25/2019   Other fatigue 10/25/2019   Deficiency anemia 10/25/2019   Dizziness 10/22/2019   Gait abnormality 10/22/2019   Hx of nonmelanoma skin cancer 03/15/2019   Essential hypertension 04/24/2018   Influenza A 04/17/2018   Sepsis ([place]) 04/17/2018   Chronic diastolic CHF (  congestive heart failure) ([place]) 04/17/2018   Skin rash 08/07/2017   Port-A-Cath in place 08/04/2017   Chronic apical periodontitis 04/26/2017   Retained dental roots 04/26/2017   Dental caries 04/26/2017   Chronic periodontitis 04/26/2017   Loose, teeth 04/26/2017   Peripheral polyneuropathy 04/14/2017   COPD mixed type ([place]) 02/23/2017   Chronic sinusitis 01/27/2017   Splenomegaly, congestive, chronic 12/29/2016   Diarrhea 12/01/2016   Poor dentition 12/01/2016   Benign mole 12/01/2016   Pancytopenia, acquired ([place]) 11/03/2016   Odynophagia 01/15/2016   Polycythemia, secondary 06/26/2015   Quality of life palliative care encounter 06/26/2015   Atherosclerosis of extremity with intermittent claudication ([place]) 01/23/2015   Lateral epicondylitis of left elbow    ECRB (extensor carpi radialis brevis)  tenosynovitis    Anxiety 01/30/2014   Preoperative clearance 01/30/2014   Thrombocytopenia ([place]) 01/30/2014   Diabetes mellitus without complication ([place]) 37/05/8887   Hypogammaglobulinemia, acquired ([place]) 12/26/2012   Hereditary and idiopathic peripheral neuropathy 08/20/2012   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

## 2020-11-30 NOTE — Telephone Encounter (Signed)
Pt called back and he stated that he is not able to go get the covid test done on 10/21 due to he is having an infusion.  He stated that his biopsy is scheduled for 10/25.  He stated that he will get the covid test doen on 10/20.  I advised him that would be fine and I would send a message to BI to make him aware.  Nothing further is needed.

## 2020-11-30 NOTE — Telephone Encounter (Signed)
I have called and LM on VM for the pt.

## 2020-12-02 ENCOUNTER — Other Ambulatory Visit: Payer: Self-pay

## 2020-12-02 ENCOUNTER — Ambulatory Visit (HOSPITAL_COMMUNITY)
Admission: RE | Admit: 2020-12-02 | Discharge: 2020-12-02 | Disposition: A | Discharge status: Home / Self Care | Payer: Medicare Other | Source: Ambulatory Visit | Attending: Pulmonary Disease | Admitting: Pulmonary Disease

## 2020-12-02 DIAGNOSIS — R911 Solitary pulmonary nodule: Secondary | ICD-10-CM | POA: Insufficient documentation

## 2020-12-03 ENCOUNTER — Other Ambulatory Visit: Payer: Self-pay | Admitting: Pulmonary Disease

## 2020-12-03 ENCOUNTER — Ambulatory Visit: Payer: Medicare Other | Admitting: Rehabilitation

## 2020-12-03 ENCOUNTER — Other Ambulatory Visit: Payer: Medicare Other

## 2020-12-03 ENCOUNTER — Encounter: Payer: Self-pay | Admitting: Rehabilitation

## 2020-12-03 DIAGNOSIS — R2689 Other abnormalities of gait and mobility: Secondary | ICD-10-CM

## 2020-12-03 DIAGNOSIS — R262 Difficulty in walking, not elsewhere classified: Secondary | ICD-10-CM

## 2020-12-03 DIAGNOSIS — M6281 Muscle weakness (generalized): Secondary | ICD-10-CM

## 2020-12-03 DIAGNOSIS — C3492 Malignant neoplasm of unspecified part of left bronchus or lung: Secondary | ICD-10-CM

## 2020-12-03 NOTE — Therapy (Signed)
[facility] @ [place], [place], [address] Phone: [phone]   Fax:  [phone]  Physical Therapy Treatment  Patient Details  Name: Ryan Copeland MRN: [id number] Date of Birth: 03/19/1950 Referring Provider (PT): Alvy Bimler   Encounter Date: 12/03/2020   PT End of Session - 12/03/20 1420     Visit Number 5    Number of Visits 13    Date for PT Re-Evaluation 12/23/20    PT Start Time 1330    PT Stop Time 1410    PT Time Calculation (min) 40 min    Activity Tolerance Patient tolerated treatment well    Behavior During Therapy [facility] for tasks assessed/performed             Past Medical History:  Diagnosis Date   Anxiety 01/30/2014   Back injury    lower disc   CAD (coronary artery disease)    CLL (chronic lymphocytic leukemia) ([place]) 03/18/2011   COPD (chronic obstructive pulmonary disease) ([place])    DM type 2 (diabetes mellitus, type 2) ([place])    ECRB (extensor carpi radialis brevis) tenosynovitis    Foley catheter in place 11/2019   last changed 04-14-2020   GERD (gastroesophageal reflux disease)    takes Nexium if needed   History of blood transfusion 2015   Hyperlipidemia    Insomnia    Long-term current use of intravenous immunoglobulin (IVIG)    q month ivig   MI, acute, non ST segment elevation ([place]) 06/28/2009   with stenting of the LAD   Neuromuscular disorder ([place])    peripheral neuropathy both all toes   Pneumonia 11/2019   PVD (peripheral vascular disease) ([place])    Thrombocytopenia ([place])    Tobacco abuse     Past Surgical History:  Procedure Laterality Date   [place] N/A 09/26/2014   Procedure: Left Heart Cath and Coronary Angiography;  Surgeon: Peter M Martinique, MD;  Location: [facility];  Service: Cardiovascular;  Laterality: N/A;   carpel tunnel release Left 04-1989   carpel tunnel release  Right 01-1989    CHOLECYSTECTOMY  2007   [place]   CORONARY STENT PLACEMENT  06/2009   stent to lad   CYSTOSCOPY N/A 04/20/2020   Procedure: [place];  Surgeon: Janith Lima, MD;  Location: [facility];  Service: Urology;  Laterality: N/A;  ONLY NEEDS 30 MIN   femoral stents  09/2014   iliac stent   IR CV LINE INJECTION  08/18/2017   IR CV LINE INJECTION  09/01/2017   IR CV LINE INJECTION  02/02/2018   LATERAL EPICONDYLE RELEASE Left 02/12/2014   Procedure: LEFT ELBOW DEBRIDEMENT WITH TENDON REPAIR ;  Surgeon: Lorn Junes, MD;  Location: [facility];  Service: Orthopedics;  Laterality: Left;   LEFT CAI STENT/PTA AND POPLITEAL ARTERY/TIBIAL THROMBECTOMY      LEFT HEART CATHETERIZATION WITH CORONARY ANGIOGRAM N/A 08/26/2011   Procedure: LEFT HEART CATHETERIZATION WITH CORONARY ANGIOGRAM;  Surgeon: Peter M Martinique, MD;  Location: [facility];  Service: Cardiovascular;  Laterality: N/A;   PERIPHERAL VASCULAR CATHETERIZATION N/A 01/01/2015   Procedure: Abdominal Aortogram;  Surgeon: Conrad [place], MD;  Location: [facility];  Service: Cardiovascular;  Laterality: N/A;   port a cath insertion  2014   right    TARSAL TUNNEL RELEASE Bilateral 08-2007    There  were no vitals filed for this visit.   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 No/denies                               [facility] Adult PT Treatment/Exercise - 12/03/20 0001       High Level Balance   High Level Balance Activities Tandem walking;Side stepping;Backward  walking;Marching forwards;Marching backwards    High Level Balance Comments with HHA PRN in the parallel bars.  At the rebounder: weight shift Rt/Lt, F/B, slow march. obstacle in the hallway 5 colored discs for weaving and ladders for step over x 3 with CGA using gait belt but no LOB      Knee/Hip Exercises: Aerobic   Nustep Level 5 UE/LE seat and arms at 12, x9 mins with therapist monitoring pt throughout for fatigue   around 615 steps completed (pt wanting to track)     Knee/Hip Exercises: Standing   Knee Flexion Both;10 reps    Knee Flexion Limitations 3#    Hip Abduction Both;10 reps    Abduction Limitations vcs for upright posture    Other Standing Knee Exercises lunge onto bosu ball with hand hold on bars; diffulty without hand hold x 10      Knee/Hip Exercises: Seated   Long Arc Quad Both;10 reps    Long Arc Quad Weight 3 lbs.                          PT Long Term Goals - 11/11/20 1456       PT LONG TERM GOAL #1   Title Pt will be able to sit to stand from a chair 8 times in 30 seconds without use of UEs to decrease fall risk.    Baseline has to use UEs    Time 6    Period Weeks    Status New    Target Date 12/23/20      PT LONG TERM GOAL #2   Title Pt will demonstrate 4/5 bilateral hip extensor strength to help decrease fall risk    Baseline 2+/5    Time 6    Period Weeks    Status New    Target Date 12/23/20      PT LONG TERM GOAL #3   Title Pt will demonstrate 5/5 bilateral hip flexor strength to decrease fall risk    Baseline 3/5    Time 6    Period Weeks    Status New    Target Date 12/23/20      PT LONG TERM GOAL #4   Title Pt will demonstrate 5/5 bilateral shoulder flexion strength to allow pt to return to PLOF where he was able to lift 50 lb bags of gravel.    Baseline 3/5    Time 6    Period Weeks    Status New    Target Date 12/23/20      PT LONG TERM GOAL #5   Title Pt  will be able to get in his truck without increased  difficulty.    Baseline pt has difficulty    Time 6    Period Weeks    Status New    Target Date 12/23/20      Additional Long Term Goals   Additional Long Term Goals Yes      PT LONG TERM GOAL #6   Title Pt will be able to ascend 1 flight of steps with step through gait pattern and only 1 hand hold assist to decrease fall risk    Baseline step to gait pattern    Time 6    Period Weeks    Status New    Target Date 12/23/20      PT LONG TERM GOAL #7   Title Pt will be independent in a home exercise program for long term stretching and strengthening    Time 6    Period Weeks    Target Date 12/23/20                   Plan - 12/03/20 1420     Clinical Impression Statement Continued with blaance and LE strengthening.  Tolerated all well without LOB.  Still with difficulty due to ankle weakness with unstable surfaces.  Will try 4 way ankle next time.    PT Frequency 2x / week    PT Duration 6 weeks    PT Treatment/Interventions ADLs/Self Care Home Management;Therapeutic activities;Therapeutic exercise;Gait training;Stair training;Functional mobility training;Balance training;Neuromuscular re-education;Patient/family education;Manual techniques    PT Next Visit Plan Cont LE strengthening especially hip extensors and flexors and ankle DF, work on gait and transfers, stair ambulation, high level balance, once leg strength addressed maybe add edema of LEs to POC for pt get compression garments?             Patient will benefit from skilled therapeutic intervention in order to improve the following deficits and impairments:     Visit Diagnosis: Muscle weakness (generalized)  Other abnormalities of gait and mobility  Non-small cell carcinoma of left lung (HCC)  Difficulty in walking, not elsewhere classified     Problem List Patient Active Problem List   Diagnosis Date Noted   Bilateral leg edema 10/27/2020   Physical debility 10/27/2020   Lung nodule  09/23/2020   Non-small cell carcinoma of lung, left ([place]) 09/18/2020   Head and neck lymphadenopathy 78/67/6720   Complicated UTI (urinary tract infection) 07/18/2020   Gum lesion 05/29/2020   Acute lower UTI 02/16/2020   Bladder pain 02/16/2020   Chronic hypotension 02/16/2020   Vitamin B12 deficiency 01/01/2020   Hypotension due to drugs 01/01/2020   Protein-calorie malnutrition, moderate ([place]) 01/01/2020   Septic shock ([place]) 12/15/2019   Community acquired pneumonia 11/28/2019   Cerebral vascular disease 11/25/2019   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/2019   Other fatigue 10/25/2019   Deficiency anemia 10/25/2019   Dizziness 10/22/2019   Gait abnormality 10/22/2019   Hx of nonmelanoma skin cancer 03/15/2019   Essential hypertension 04/24/2018   Influenza A 04/17/2018   Sepsis ([place]) 04/17/2018   Chronic diastolic CHF (congestive heart failure) ([place]) 04/17/2018   Skin rash 08/07/2017   Port-A-Cath in place 08/04/2017   Chronic apical periodontitis 04/26/2017   Retained dental roots 04/26/2017   Dental caries 04/26/2017   Chronic periodontitis 04/26/2017   Loose, teeth 04/26/2017   Peripheral polyneuropathy 04/14/2017   COPD mixed type ([place]) 02/23/2017   Chronic sinusitis 01/27/2017   Splenomegaly, congestive,  chronic 12/29/2016   Diarrhea 12/01/2016   Poor dentition 12/01/2016   Benign mole 12/01/2016   Pancytopenia, acquired ([place]) 11/03/2016   Odynophagia 01/15/2016   Polycythemia, secondary 06/26/2015   Quality of life palliative care encounter 06/26/2015   Atherosclerosis of extremity with intermittent claudication ([place]) 01/23/2015   Lateral epicondylitis of left elbow    ECRB (extensor carpi radialis brevis) tenosynovitis    Anxiety 01/30/2014   Preoperative clearance 01/30/2014   Thrombocytopenia ([place]) 01/30/2014   Diabetes mellitus without complication ([place]) 37/48/2707   Hypogammaglobulinemia, acquired ([place]) 12/26/2012    Hereditary and idiopathic peripheral neuropathy 08/20/2012   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

## 2020-12-04 ENCOUNTER — Other Ambulatory Visit: Payer: Self-pay

## 2020-12-04 ENCOUNTER — Inpatient Hospital Stay: Payer: Medicare Other | Attending: Hematology and Oncology | Admitting: Hematology and Oncology

## 2020-12-04 ENCOUNTER — Inpatient Hospital Stay: Payer: Medicare Other

## 2020-12-04 ENCOUNTER — Encounter: Payer: Self-pay | Admitting: Hematology and Oncology

## 2020-12-04 ENCOUNTER — Encounter (HOSPITAL_COMMUNITY): Payer: Self-pay | Admitting: Pulmonary Disease

## 2020-12-04 VITALS — BP 100/46 | HR 62 | Temp 98.0°F | Resp 16

## 2020-12-04 DIAGNOSIS — C3492 Malignant neoplasm of unspecified part of left bronchus or lung: Secondary | ICD-10-CM | POA: Diagnosis present

## 2020-12-04 DIAGNOSIS — Z95828 Presence of other vascular implants and grafts: Secondary | ICD-10-CM

## 2020-12-04 DIAGNOSIS — D801 Nonfamilial hypogammaglobulinemia: Secondary | ICD-10-CM | POA: Insufficient documentation

## 2020-12-04 DIAGNOSIS — Z79899 Other long term (current) drug therapy: Secondary | ICD-10-CM | POA: Diagnosis not present

## 2020-12-04 DIAGNOSIS — C8308 Small cell B-cell lymphoma, lymph nodes of multiple sites: Secondary | ICD-10-CM

## 2020-12-04 LAB — CBC WITH DIFFERENTIAL/PLATELET
Abs Immature Granulocytes: 0.03 10*3/uL (ref 0.00–0.07)
Basophils Absolute: 0 10*3/uL (ref 0.0–0.1)
Basophils Relative: 0 %
Eosinophils Absolute: 0.1 10*3/uL (ref 0.0–0.5)
Eosinophils Relative: 1 %
HCT: 37.3 % — ABNORMAL LOW (ref 39.0–52.0)
Hemoglobin: 11.6 g/dL — ABNORMAL LOW (ref 13.0–17.0)
Immature Granulocytes: 0 %
Lymphocytes Relative: 69 %
Lymphs Abs: 6.8 10*3/uL — ABNORMAL HIGH (ref 0.7–4.0)
MCH: 28.6 pg (ref 26.0–34.0)
MCHC: 31.1 g/dL (ref 30.0–36.0)
MCV: 91.9 fL (ref 80.0–100.0)
Monocytes Absolute: 0.4 10*3/uL (ref 0.1–1.0)
Monocytes Relative: 4 %
Neutro Abs: 2.5 10*3/uL (ref 1.7–7.7)
Neutrophils Relative %: 26 %
Platelets: 84 10*3/uL — ABNORMAL LOW (ref 150–400)
RBC: 4.06 MIL/uL — ABNORMAL LOW (ref 4.22–5.81)
RDW: 18.2 % — ABNORMAL HIGH (ref 11.5–15.5)
WBC: 9.8 10*3/uL (ref 4.0–10.5)
nRBC: 0 % (ref 0.0–0.2)

## 2020-12-04 LAB — SARS CORONAVIRUS 2 (TAT 6-24 HRS): SARS Coronavirus 2: NEGATIVE

## 2020-12-04 LAB — CMP (CANCER CENTER ONLY)
ALT: 30 U/L (ref 0–44)
AST: 29 U/L (ref 15–41)
Albumin: 3.8 g/dL (ref 3.5–5.0)
Alkaline Phosphatase: 102 U/L (ref 38–126)
Anion gap: 5 (ref 5–15)
BUN: 13 mg/dL (ref 8–23)
CO2: 26 mmol/L (ref 22–32)
Calcium: 8.6 mg/dL — ABNORMAL LOW (ref 8.9–10.3)
Chloride: 107 mmol/L (ref 98–111)
Creatinine: 0.71 mg/dL (ref 0.61–1.24)
GFR, Estimated: >60 mL/min (ref >60)
Glucose, Bld: 110 mg/dL — ABNORMAL HIGH (ref 70–99)
Potassium: 4.1 mmol/L (ref 3.5–5.1)
Sodium: 138 mmol/L (ref 135–145)
Total Bilirubin: 0.4 mg/dL (ref 0.3–1.2)
Total Protein: 6.3 g/dL — ABNORMAL LOW (ref 6.5–8.1)

## 2020-12-04 MED ORDER — METHYLPREDNISOLONE SODIUM SUCC 40 MG IJ SOLR
40.0000 mg | Freq: Once | INTRAMUSCULAR | Status: AC
  Administered 2020-12-04: 40 mg via INTRAVENOUS
  Filled 2020-12-04: qty 1

## 2020-12-04 MED ORDER — HEPARIN SOD (PORK) LOCK FLUSH 100 UNIT/ML IV SOLN
500.0000 [IU] | Freq: Once | INTRAVENOUS | Status: AC
  Administered 2020-12-04: 500 [IU]

## 2020-12-04 MED ORDER — DIPHENHYDRAMINE HCL 25 MG PO CAPS
25.0000 mg | ORAL_CAPSULE | Freq: Once | ORAL | Status: AC
  Administered 2020-12-04: 25 mg via ORAL
  Filled 2020-12-04: qty 1

## 2020-12-04 MED ORDER — SODIUM CHLORIDE 0.9% FLUSH
10.0000 mL | Freq: Once | INTRAVENOUS | Status: AC
  Administered 2020-12-04: 10 mL

## 2020-12-04 MED ORDER — IMMUNE GLOBULIN (HUMAN) 10 GM/100ML IJ SOLN
0.5000 g/kg | Freq: Once | INTRAMUSCULAR | Status: AC
  Administered 2020-12-04: 40 g via INTRAVENOUS
  Filled 2020-12-04: qty 400

## 2020-12-04 MED ORDER — ACETAMINOPHEN 325 MG PO TABS
650.0000 mg | ORAL_TABLET | Freq: Once | ORAL | Status: AC
Start: 2020-12-04 — End: 2020-12-04
  Administered 2020-12-04: 650 mg via ORAL
  Filled 2020-12-04: qty 2

## 2020-12-04 NOTE — Assessment & Plan Note (Signed)
We discussed the rationale of pursuing bronchoscopy and biopsy I will see him the following week after his procedure to review test results

## 2020-12-04 NOTE — Progress Notes (Signed)
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   All questions were answered. The patient knows to call the clinic with any problems, questions or concerns. The total time spent in the appointment was 20 minutes encounter with patients including review of chart and various tests results, discussions about plan of care and coordination of care plan   [name], MD 12/04/2020 3:10 PM  INTERVAL HISTORY: Please see below for problem oriented charting. he returns for treatment follow-up He returns today for IVIG treatment and is on Calquence for CLL He had recent pneumonia diagnosis; after antibiotics treatment, he felt better He continues to have mild cough but not as severe as before  REVIEW OF SYSTEMS:   Constitutional: Denies fevers, chills or abnormal weight loss Eyes: Denies blurriness of vision Cardiovascular: Denies palpitation, chest discomfort or lower extremity swelling Gastrointestinal:  Denies nausea, heartburn or change in bowel habits Skin: Denies abnormal skin rashes Lymphatics: Denies new lymphadenopathy or easy bruising Neurological:Denies numbness, tingling or new  weaknesses Behavioral/Psych: Mood is stable, no new changes  All other systems were reviewed with the patient and are negative.  I have reviewed the past medical history, past surgical history, social history and family history with the patient and they are unchanged from previous note.  ALLERGIES:  is allergic to immune globulin and codeine.  MEDICATIONS:  Current Outpatient Medications  Medication Sig Dispense Refill   Acalabrutinib Maleate (CALQUENCE) 100 MG TABS Take 1 tablet (100 mg) by mouth 2 (two) times daily. 60 tablet 1   acetaminophen (TYLENOL) 325 MG tablet Take 2 tablets (650 mg total) by mouth every 6 (six) hours as needed for moderate pain or fever. 30 tablet 0   acyclovir (ZOVIRAX) 400 MG tablet TAKE 1 TABLET BY MOUTH TWICE A DAY (Patient taking differently: Take 400 mg by mouth 2 (two) times daily.) 180 tablet 1   allopurinol (ZYLOPRIM) 300 MG tablet Take 1 tablet (300 mg total) by mouth daily. 90 tablet 1   ALPRAZolam (XANAX) 0.5 MG tablet Take 0.5 mg by mouth at bedtime as needed for anxiety.     b complex vitamins tablet Take 1 tablet by mouth daily.      benzonatate (TESSALON) 100 MG capsule TAKE TWO CAPSULES BY MOUTH THREE TIMES A DAY AS NEEDED 90 capsule 1   Cholecalciferol 25 MCG (1000 UT) tablet Take 1,000 Units by mouth daily.      diphenoxylate-atropine (LOMOTIL) 2.5-0.025 MG tablet TAKE 1 TABLET BY MOUTH 4 (FOUR) TIMES DAILY AS NEEDED FOR DIARRHEA OR LOOSE STOOLS. 60 tablet 1   famotidine (PEPCID) 20 MG tablet TAKE 1 TABLET BY MOUTH TWICE A DAY (Patient taking differently: Take 20 mg by mouth 2 (two) times daily as needed for heartburn.) 180 tablet  1   furosemide (LASIX) 20 MG tablet Take 20 mg by mouth daily.     HYDROcodone-acetaminophen (NORCO) 10-325 MG tablet Take 1 tablet by mouth every 6 (six) hours as needed for moderate pain.     lidocaine-prilocaine (EMLA) cream Apply 1 application topically as needed (prior chemo/IBIG). 30 g 9   loperamide (IMODIUM) 2 MG  capsule Take 2 capsules (4 mg total) by mouth 4 (four) times daily. (Patient taking differently: Take 4 mg by mouth 4 (four) times daily as needed for diarrhea or loose stools.) 120 capsule 9   meclizine (ANTIVERT) 12.5 MG tablet Take 12.5 mg by mouth 3 (three) times daily as needed for dizziness.     metFORMIN (GLUCOPHAGE-XR) 500 MG 24 hr tablet Take 500 mg by mouth every evening.     midodrine (PROAMATINE) 5 MG tablet Take 3 tablets (15 mg total) by mouth 3 (three) times daily with meals. 270 tablet 2   montelukast (SINGULAIR) 10 MG tablet TAKE 1 TABLET BY MOUTH EVERY DAY (Patient taking differently: Take 10 mg by mouth at bedtime.) 90 tablet 1   nitroGLYCERIN (NITROSTAT) 0.4 MG SL tablet PLACE 1 TABLET UNDER THE TONGUE EVERY 5 MINUTES X 3 DOSES AS NEEDED FOR CHEST PAIN *MAX 3 DOSES* 25 tablet 7   oxybutynin (DITROPAN) 5 MG tablet Take 5 mg by mouth 2 (two) times daily as needed.     pregabalin (LYRICA) 200 MG capsule TAKE 1 CAPSULE (200 MG TOTAL) BY MOUTH 3 (THREE) TIMES DAILY. 270 capsule 1   rosuvastatin (CRESTOR) 5 MG tablet Take 1 tablet (5 mg total) by mouth daily. 90 tablet 3   sildenafil (VIAGRA) 100 MG tablet Take 0.5 tablets (50 mg total) by mouth as needed for erectile dysfunction (30 mintues prior to sexual intercourse). 10 tablet 3   tamsulosin (FLOMAX) 0.4 MG CAPS capsule Take 1 capsule (0.4 mg total) by mouth daily. 30 capsule 0   zolpidem (AMBIEN) 10 MG tablet Take 10 mg by mouth at bedtime as needed for sleep.     No current facility-administered medications for this visit.   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  No evidence of aortic dissection or other acute process in the chest. 2.  Centrilobular emphysema with a 5 mm right lung nodule. Given the concurrent centrilobular emphysema, follow-up chest CT at 6 -12 months is recommended.  3.  Coronary artery atherosclerosis which is age advanced. 4.  Prominent thoracic lymph nodes.  These can be reevaluated at follow-up.   05/06/2010 Imaging   1.  Multiple small periaortic lymph nodes consistent with the patient's history of the chronic lymphocytic leukemia. 2.  No evidence of solid organ involvement   11/03/2011 Imaging   1.  Interval progression of abdominal and pelvic adenopathy. 2.  Progression of splenomegaly.  The spleen now measures 23 cm in length   11/18/2011 Bone Marrow Biopsy   Bone Marrow, Aspirate,Biopsy,  and Clot, right iliac bone - HYPERCELLULAR BONE MARROW WITH EXTENSIVE INVOLVEMENT BY CHRONIC LYMPHOCYTIC LEUKEMIA. PERIPHERAL BLOOD: - CHRONIC LYMPHOCYTIC LEUKEMIA   03/08/2012 Imaging   1.  Progressive increase in retroperitoneal, iliac, and inguinal lymphadenopathy. 2.  Interval increase in massive splenomegaly.     06/01/2012 Procedure   Placement of single lumen port a cath via right internal jugular vein.  The catheter tip lies at the cavoatrial junction.  A power injectable port a cath was placed and is ready for immediate use   06/20/2012 - 11/23/2012 Chemotherapy   He received FCR x 6 cycles   12/19/2012 Imaging   Left common iliac stent. Abdominal vasculature remains patent. Improving supraclavicular and axillary lymphadenopathy. Residual right subpectoral nodes measure up to 10 mm short axis. Improving retroperitoneal lymphadenopathy, measuring up to 16 mm short axis. Improving splenomegaly, measuring 18.7 cm.     01/20/2016 Imaging   1. Stable exam.   No new or progressive findings. 2. No CT findings to explain odynophagia   09/02/2016 Pathology Results   The findings are consistent with involvement by previously known chronic lymphocytic leukemia   09/02/2016 Pathology Results   FISH for CLL came back positive for deletion 13q   09/15/2016 Imaging   1. Borderline enlarged abdominal and pelvic lymph nodes. Compared with 11/03/2011 these are decreased in size as detailed above. 2. Persistent splenomegaly. 3. Aortic Atherosclerosis (ICD10-I70.0). LAD coronary artery calcification noted.   10/06/2016 - 02/24/2017 Chemotherapy   He received Bendamustine and Rituxan   11/03/2016 Adverse Reaction   Dose of Bendamustine is reduced due to severe pancytopenia   12/28/2016 Imaging   1. Borderline enlarged abdominal peritoneal ligament and abdominal retroperitoneal lymph nodes, stable. 2. Splenomegaly. 3.  Aortic atherosclerosis (ICD10-170.0).   03/22/2017 PET scan   1. No hypermetabolic adenopathy identified within the neck, chest, abdomen or pelvis. 2. Prominent left retroperitoneal node measures 1.6 cm without significant FDG uptake. 3. Splenomegaly. 4. Aortic Atherosclerosis (ICD10-I70.0) and Emphysema (ICD10-J43.9). LAD and left circumflex atherosclerotic calcifications noted.   02/02/2018 Procedure   IMPRESSION: Widely patent right IJ power port catheter.   04/06/2018 Imaging   1. Interval enlargement of axillary, mediastinal, and retroperitoneal lymph nodes, as well as increased splenomegaly, findings concerning for progression of CLL in comparison to prior PET-CT dated 03/22/2017.   2.  Other chronic and incidental findings as detailed above.     04/16/2018 -  Chemotherapy   The patient had acalabrutinib for chemo   04/17/2018 - 04/21/2018 Hospital Admission   He was hospitalized for influenza   11/29/2018 Imaging   1. Interval response to therapy. No thoracic added not scratch set no thoracic or pelvic adenopathy identified.  Significant improvement in abdominal adenopathy. Largest remaining lymph node measures 1.3cm in the left retroperitoneal region. Previously 2.5 cm. 2. Persistent splenomegaly, improved from previous exam. 3. No new or progressive disease identified within the chest, abdomen or pelvis. 4. Stable appearance of 5 mm right middle lobe lung nodule. 5. Aortic Atherosclerosis (ICD10-I70.0) and Emphysema (ICD10-J43.9). Coronary artery calcifications.     09/17/2020 Imaging   CT neck  Negative for mass or adenopathy in the neck. No evidence of recurrent CLL   09/17/2020 Imaging   1. Interval enlargement of a perihilar nodule anteriorly in the left upper lobe, measuring 2.1 x 1.2 cm, previously 1.4 x 0.9 cm when measured similarly. This is highly concerning for a slowly enlarging primary lung malignancy. Recommend multidisciplinary thoracic referral for consideration of metabolic characterization by PET-CT and tissue  sampling. 2. There is a new nodule of the anterior left upper lobe measuring 1.0 cm, nonspecific although worrisome for satellite nodule or metastasis. 3. Unchanged enlargement of a left hilar lymph node, concerning for nodal metastasis. 4. Unchanged consolidation and nodularity of the infrahilar left lower lobe as well as of the perihilar right middle lobe, these findings generally nonspecific and infectious or inflammatory, appearance particularly suggesting atypical mycobacterial infection.  5. Redemonstrated enlarged right axillary and retroperitoneal lymph nodes, stable to slightly diminished in size. These are in keeping with reported diagnosis of CLL. 6. Slightly improved splenomegaly, maximum coronal span 18.0 cm.  7. Emphysema. 8. Coronary artery disease.   Aortic Atherosclerosis (ICD10-I70.0) and Emphysema (ICD10-J43.9).   Non-small cell carcinoma of lung, left ([place])  09/18/2020 Initial Diagnosis   Non-small cell carcinoma of lung, left ([place])   09/20/2020 Cancer Staging   Staging  form: Lung, AJCC 8th Edition - Clinical stage from 09/20/2020: Stage IVA (cT3, cN1, cM1a) - Signed by [name], MD on 09/20/2020 Stage prefix: Initial diagnosis    09/30/2020 Imaging   IMPRESSION MRI brain No evidence of metastatic disease in the brain.   10/05/2020 PET scan   Hypermetabolic 1 cm nodule in the LEFT upper lobe suspicious for pulmonary neoplasm. Referral to multi disciplinary thoracic oncologic setting is suggested if not yet performed. Biopsy may be warranted.   Juxta hilar nodule that shows enlargement over time and associated LEFT hilar lymph node without pronounced FDG uptake but given enlargement and uptake above blood pool raising the question of indolent neoplasm in this location.   Stable small pulmonary nodule in the RIGHT middle lobe and stable RIGHT upper lobe subsolid nodule, attention on follow-up.   RIGHT axillary and retroperitoneal nodal enlargement with mildly elevated FDG uptake near mediastinal blood pool with respect to retroperitoneal lymph nodes, likely related to the patient's known CLL. Splenic size remains smaller than in 2022. Close attention on follow-up is suggested.   Aortic Atherosclerosis (ICD10-I70.0).   Emphysema (ICD10-J43.9).     PHYSICAL EXAMINATION: ECOG PERFORMANCE STATUS: 1 - Symptomatic but completely ambulatory  Vitals:   12/04/20 0858  BP: (!) 116/58  Pulse: 61  Resp: 18  Temp: (!) 97.4 F (36.3 C)  SpO2: 100%   Filed Weights   12/04/20 0858  Weight: 168 lb 6.4 oz (76.4 kg)    GENERAL:alert, no distress and comfortable SKIN: Noted extensive skin bruises NEURO: alert & oriented x 3 with fluent speech, no focal motor/sensory deficits  LABORATORY DATA:  I have reviewed the data as listed    Component Value Date/Time   NA 138 12/04/2020 0828   NA 142 06/19/2019 0848   NA 136 01/26/2017 0940   K 4.1 12/04/2020 0828   K 4.0 01/26/2017 0940   CL 107 12/04/2020 0828   CL 104 08/03/2012 1229   CO2 26 12/04/2020 0828    CO2 23 01/26/2017 0940   GLUCOSE 110 (H) 12/04/2020 0828   GLUCOSE 146 (H) 01/26/2017 0940   GLUCOSE 223 (H) 08/03/2012 1229   BUN 13 12/04/2020 0828   BUN 13 06/19/2019 0848   BUN 9.6 01/26/2017 0940   CREATININE 0.71 12/04/2020 0828   CREATININE 1.0 01/26/2017 0940   CALCIUM 8.6 (L) 12/04/2020 0828   CALCIUM 8.7 01/26/2017 0940   PROT 6.3 (L) 12/04/2020 0828   PROT 5.9 (L) 04/13/2017 0823   PROT 5.7 (L) 01/26/2017 0940   ALBUMIN 3.8 12/04/2020 0828   ALBUMIN 3.6 01/26/2017 0940   AST 29 12/04/2020  0828   AST 13 01/26/2017 0940   ALT 30 12/04/2020 0828   ALT 12 01/26/2017 0940   ALKPHOS 102 12/04/2020 0828   ALKPHOS 78 01/26/2017 0940   BILITOT 0.4 12/04/2020 0828   BILITOT 0.64 01/26/2017 0940   GFRNONAA >60 12/04/2020 0828   GFRAA >60 10/25/2019 0819   GFRAA >60 04/24/2018 0821    No results found for: SPEP, UPEP  Lab Results  Component Value Date   WBC 9.8 12/04/2020   NEUTROABS 2.5 12/04/2020   HGB 11.6 (L) 12/04/2020   HCT 37.3 (L) 12/04/2020   MCV 91.9 12/04/2020   PLT 84 (L) 12/04/2020      Chemistry      Component Value Date/Time   NA 138 12/04/2020 0828   NA 142 06/19/2019 0848   NA 136 01/26/2017 0940   K 4.1 12/04/2020 0828   K 4.0 01/26/2017 0940   CL 107 12/04/2020 0828   CL 104 08/03/2012 1229   CO2 26 12/04/2020 0828   CO2 23 01/26/2017 0940   BUN 13 12/04/2020 0828   BUN 13 06/19/2019 0848   BUN 9.6 01/26/2017 0940   CREATININE 0.71 12/04/2020 0828   CREATININE 1.0 01/26/2017 0940      Component Value Date/Time   CALCIUM 8.6 (L) 12/04/2020 0828   CALCIUM 8.7 01/26/2017 0940   ALKPHOS 102 12/04/2020 0828   ALKPHOS 78 01/26/2017 0940   AST 29 12/04/2020 0828   AST 13 01/26/2017 0940   ALT 30 12/04/2020 0828   ALT 12 01/26/2017 0940   BILITOT 0.4 12/04/2020 0828   BILITOT 0.64 01/26/2017 0940       RADIOGRAPHIC STUDIES: I have personally reviewed the radiological images as listed and agreed with the findings in the  report. CT Super D Chest Wo Contrast  Result Date: 12/04/2020 CLINICAL DATA:  Follow-up pneumonia.  Lung nodule. EXAM: CT CHEST WITHOUT CONTRAST TECHNIQUE: Multidetector CT imaging of the chest was performed using thin slice collimation for electromagnetic bronchoscopy planning purposes, without intravenous contrast. COMPARISON:  CT 11/02/2020 on PET-CT 10/02/2020, CT 09/16/2020 FINDINGS: Cardiovascular: Port in the anterior chest wall with tip in distal SVC. Coronary artery calcification and aortic atherosclerotic calcification. Mediastinum/Nodes: No axillary or supraclavicular adenopathy. No mediastinal or hilar adenopathy. No pericardial fluid. Esophagus normal. Lungs/Pleura: Lobular nodule in the LEFT upper lobe measures 12 mm (image 74/7) compared to 10 mm on CT 09/16/2020. Interval near complete resolution of LEFT lower lobe consolidation with only residual medial mild consolidation remaining measuring 2.5 cm (115/7). Complete resolution of the RIGHT lower lobe consolidation. Resolution of the pleural effusions. Within the RIGHT middle lobe 5 mm nodule (image 108/7) is unchanged. Upper Abdomen: Limited view of the liver, kidneys, pancreas are unremarkable. Normal adrenal glands. Musculoskeletal: No aggressive osseous lesion. IMPRESSION: 1. Persistent LEFT upper lobe pulmonary nodule with suspicious morphology. Lesion was hypermetabolic on comparison PET-CT scan. Nodule concerning for bronchogenic carcinoma. 2. Near complete resolution of LEFT lower lobe consolidation and complete resolution of RIGHT lower lobe consolidation. 3. Stable small RIGHT middle lobe pulmonary nodule. Electronically Signed   By: Suzy Bouchard M.D.   On: 12/04/2020 07:58

## 2020-12-04 NOTE — Assessment & Plan Note (Signed)
I recommend the patient to hold Calquence for 3 days prior to bronchoscopy and biopsy and resume 3 days after biopsy

## 2020-12-04 NOTE — Assessment & Plan Note (Signed)
We discussed the risk and benefits of IVIG treatment and he is in agreement to proceed

## 2020-12-07 ENCOUNTER — Ambulatory Visit: Payer: Medicare Other

## 2020-12-07 ENCOUNTER — Other Ambulatory Visit: Payer: Self-pay

## 2020-12-07 DIAGNOSIS — C3492 Malignant neoplasm of unspecified part of left bronchus or lung: Secondary | ICD-10-CM

## 2020-12-07 DIAGNOSIS — R262 Difficulty in walking, not elsewhere classified: Secondary | ICD-10-CM

## 2020-12-07 DIAGNOSIS — M6281 Muscle weakness (generalized): Secondary | ICD-10-CM | POA: Diagnosis not present

## 2020-12-07 DIAGNOSIS — R2689 Other abnormalities of gait and mobility: Secondary | ICD-10-CM

## 2020-12-07 NOTE — Progress Notes (Signed)
I did not reach [name] on the phone.  I called again and Ryan Copeland answered, I gave instructions to Ryan Copeland.  [name] has type II diabetes. I instructed Mrs Ryan Copeland to have [name] to hold Metformin in am. [name] does not have strips to check CBG.  I instructed patient to shower with antibiotic soap, if it is available.  Dry off with a clean towel. Do not put lotion, powder, cologne or deodorant or makeup.No jewelry or piercings. Men may shave their face and neck. Woman should not shave. No nail polish, artificial or acrylic nails. Wear clean clothes, brush your teeth. Glasses, contact lens,dentures or partials may not be worn in the OR. If you need to wear them, please bring a case for glasses, do not wear contacts or bring a case, the hospital does not have contact cases, dentures or partials will have to be removed , make sure they are clean, we will provide a denture cup to put them in. You will need some one to drive you home and a responsible person over the age of 89 to stay with you for the first 24 hours after surgery.

## 2020-12-07 NOTE — Patient Instructions (Signed)
Ankle Eversion: Long-Sitting    Cancer Rehab [phone]    Loop tubing around feet just below toes, legs separated as far as tolerated, toes pointed inward. Rotate ankles, pointing toes outward. Repeat with other leg. Repeat _5-10_ times per set. Do _1_ sets per session. Do _3-4_ sessions per week.  Ankle Inversion: Long-Sitting (Single Leg)    Tubing around forefoot, on same side as anchor, rotate ankle, pointing toes inward. Repeat _5-10_ times per set. Repeat with other leg. Do _1_ sets per session. Do _3-4_ sessions per week.  Ankle Dorsiflexion: Long-Sitting (Single Leg)    Sit facing anchor, tubing around forefoot, pull toes back toward head. Repeat _5-10_ times per set. Repeat with other leg. Do _1_ sets per session. Do _3-4_ sessions per week.    With exercise band around arch of involved foot, bend foot and toes toward floor against the band's resistance. Repeat with other leg. Repeat _5-10__ times. Do _1__ times per day. Do _3-4_ sessions per week.    Ankle Bend (Dorsiflexion and Plantar Flexion)    Sitting or lying down, point toes up, keeping both heels on floor. Then press toes to floor, raising heels. Repeat _multiple___ times throughout the day trying to stimulate blood flow.

## 2020-12-07 NOTE — Progress Notes (Signed)
Pt made aware of surgery time change of 12:45pm-2:15PM, arrival 10:15am.

## 2020-12-07 NOTE — Therapy (Signed)
[facility] @ [place], [place], [address] Phone: [phone]   Fax:  [phone]  Physical Therapy Treatment  Patient Details  Name: Ryan Copeland MRN: [id number] Date of Birth: [birth year] Referring Provider (PT): Alvy Bimler   Encounter Date: 12/07/2020   PT End of Session - 12/07/20 1406     Visit Number 6    Number of Visits 13    Date for PT Re-Evaluation 12/23/20    PT Start Time 8588    PT Stop Time 5027    PT Time Calculation (min) 41 min    Activity Tolerance Patient tolerated treatment well    Behavior During Therapy [facility] for tasks assessed/performed             Past Medical History:  Diagnosis Date   Anxiety 01/30/2014   Back injury    lower disc   CAD (coronary artery disease)    CLL (chronic lymphocytic leukemia) ([place]) 03/18/2011   COPD (chronic obstructive pulmonary disease) ([place])    DM type 2 (diabetes mellitus, type 2) ([place])    ECRB (extensor carpi radialis brevis) tenosynovitis    Foley catheter in place 11/2019   last changed 04-14-2020   GERD (gastroesophageal reflux disease)    takes Nexium if needed   History of blood transfusion 2015   Hyperlipidemia    Insomnia    Long-term current use of intravenous immunoglobulin (IVIG)    q month ivig   MI, acute, non ST segment elevation ([place]) 06/28/2009   with stenting of the LAD   Neuromuscular disorder ([place])    peripheral neuropathy both all toes   Pneumonia 11/2019   10/2020   PVD (peripheral vascular disease) ([place])    Thrombocytopenia ([place])    Tobacco abuse     Past Surgical History:  Procedure Laterality Date   [place] N/A 09/26/2014   Procedure: Left Heart Cath and Coronary Angiography;  Surgeon: Peter M Martinique, MD;  Location: [facility];  Service: Cardiovascular;  Laterality: N/A;   carpel tunnel release Left 04-1989   carpel tunnel release  Right  01-1989   CHOLECYSTECTOMY  2007   [place]   CORONARY STENT PLACEMENT  06/2009   stent to lad   CYSTOSCOPY N/A 04/20/2020   Procedure: [place];  Surgeon: Janith Lima, MD;  Location[facility];  Service: Urology;  Laterality: N/A;  ONLY NEEDS 30 MIN   femoral stents  09/2014   iliac stent   IR CV LINE INJECTION  08/18/2017   IR CV LINE INJECTION  09/01/2017   IR CV LINE INJECTION  02/02/2018   LATERAL EPICONDYLE RELEASE Left 02/12/2014   Procedure: LEFT ELBOW DEBRIDEMENT WITH TENDON REPAIR ;  Surgeon: Lorn Junes, MD;  Location: [employer] [facility];  Service: Orthopedics;  Laterality: Left;   LEFT CAI STENT/PTA AND POPLITEAL ARTERY/TIBIAL THROMBECTOMY      LEFT HEART CATHETERIZATION WITH CORONARY ANGIOGRAM N/A 08/26/2011   Procedure: LEFT HEART CATHETERIZATION WITH CORONARY ANGIOGRAM;  Surgeon: Peter M Martinique, MD;  Location: [facility] CATH LAB;  Service: Cardiovascular;  Laterality: N/A;   PERIPHERAL VASCULAR CATHETERIZATION N/A 01/01/2015   Procedure: Abdominal Aortogram;  Surgeon: Conrad Hummels Wharf, MD;  Location: [facility];  Service: Cardiovascular;  Laterality: N/A;   port a cath insertion  2014   right    TARSAL TUNNEL RELEASE Bilateral 08-2007  There were no vitals filed for this visit.   Subjective Assessment - 12/07/20 1328     Subjective I'm sorry I'm running late today. I think my endurance is improvign some when I'm working around the house.    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 No/denies                               [facility] Adult PT Treatment/Exercise - 12/07/20 0001       Neuro Re-ed    Neuro  Re-ed Details  Resisted walking with orange elastic band attached to Free Motion machine with SBA-CGA throughout, 4 ways and x5 each direction; then in // bars: Front and retro heel-toe walking 2x each way      Knee/Hip Exercises: Aerobic   Nustep Level 5 UE/LE,  arms at 12, x9 mins with therapist monitoring pt throughout for fatigue      Ankle Exercises: Seated   Other Seated Ankle Exercises In long sitting with therapist holding yellow theraband for bil ankle 4 ways x5 each. Pt returned therapist demo and tactile cuing to lower leg to prevent hip IR/er rotation with ankle inversion/eversion respectively                     PT Education - 12/07/20 1412     Education Details Sitting bil ankle 4 ways with yellow theraband    Person(s) Educated Patient    Methods Explanation;Demonstration;Handout    Comprehension Verbalized understanding;Returned demonstration;Need further instruction                 PT Long Term Goals - 11/11/20 1456       PT LONG TERM GOAL #1   Title Pt will be able to sit to stand from a chair 8 times in 30 seconds without use of UEs to decrease fall risk.    Baseline has to use UEs    Time 6    Period Weeks    Status New    Target Date 12/23/20      PT LONG TERM GOAL #2   Title Pt will demonstrate 4/5 bilateral hip extensor strength to help decrease fall risk    Baseline 2+/5    Time 6    Period Weeks    Status New    Target Date 12/23/20      PT LONG TERM GOAL #3   Title Pt will demonstrate 5/5 bilateral hip flexor strength to decrease fall risk    Baseline 3/5    Time 6    Period Weeks    Status New    Target Date 12/23/20      PT LONG TERM GOAL #4   Title Pt will demonstrate 5/5 bilateral shoulder flexion strength to allow pt to return to PLOF where he was able to lift 50 lb bags of gravel.    Baseline 3/5    Time 6    Period Weeks    Status New    Target Date 12/23/20      PT LONG TERM GOAL #5   Title Pt will be able to  get in his truck without increased difficulty.    Baseline pt has difficulty    Time 6    Period Weeks    Status New    Target Date 12/23/20      Additional Long Term Goals   Additional Long Term Goals Yes      PT LONG TERM GOAL #6   Title Pt will be able to ascend 1 flight of steps with step through gait pattern and only 1 hand hold assist to decrease fall risk    Baseline step to gait pattern    Time 6    Period Weeks    Status New    Target Date 12/23/20      PT LONG TERM GOAL #7   Title Pt will be independent in a home exercise program for long term stretching and strengthening    Time 6    Period Weeks    Target Date 12/23/20                   Plan - 12/07/20 1413     Clinical Impression Statement Pt arrived late for appt today so shortened session today. Continued with bil LE strength and dynamic balance. Then progresed HEP to include ankle 4 way strengthening. Due to limited ankle A/ROM and weakness pt struggled with correct technique with inversion/eversion so will benefit from further review to reassess technique after he tries this at home. He may miss next session due to having a endobrochial biopsy tomorrow. He will ask doctor and let us know if he has any temorary restrictions.    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, chronic lymphocytic leukemia, pancytopenia, bilateral LE edema, debility, COPD, DMT2, chronic foley, GERD, CAD, HLD, prior MI, PVD, and chronic hypotension    Examination-Activity Limitations Lift;Stairs;Carry;Transfers    Examination-Participation Restrictions Laundry;Cleaning;Shop;Community Activity;Yard Work    [employer] Evolving/Moderate complexity    Rehab Potential Good    PT Frequency 2x / week    PT Duration 6 weeks    PT Treatment/Interventions ADLs/Self Care Home  Management;Therapeutic activities;Therapeutic exercise;Gait training;Stair training;Functional mobility training;Balance training;Neuromuscular re-education;Patient/family education;Manual techniques    PT Next Visit Plan Cont LE strengthening especially hip extensors and flexors and ankle DF, work on gait and transfers, stair ambulation, high level balance, once leg strength addressed maybe add edema of LEs to POC for pt get compression garments?    PT Home Exercise Plan work on decreasing reliance on hands during sit to stand; bil LE and UE strength and balance activities; bil ankle 4 way with yellow theraband    Consulted and Agree with Plan of Care Patient             Patient will benefit from skilled therapeutic intervention in order to improve the following deficits and impairments:  Pain, Postural dysfunction, Impaired UE functional use, Decreased strength, Decreased activity tolerance, Difficulty walking, Decreased endurance, Decreased balance, Abnormal gait, Decreased mobility  Visit Diagnosis: Muscle weakness (generalized)  Other abnormalities of gait and mobility  Non-small cell carcinoma of left lung (HCC)  Difficulty in walking, not elsewhere classified     Problem List Patient Active Problem List   Diagnosis Date Noted   Bilateral leg edema 10/27/2020   Physical debility 10/27/2020   Lung nodule 09/23/2020   Non-small cell carcinoma of lung, left ([place]) 09/18/2020   Head and neck  lymphadenopathy 51/03/5850   Complicated UTI (urinary tract infection) 07/18/2020   Gum lesion 05/29/2020   Acute lower UTI 02/16/2020   Bladder pain 02/16/2020   Chronic hypotension 02/16/2020   Vitamin B12 deficiency 01/01/2020   Hypotension due to drugs 01/01/2020   Protein-calorie malnutrition, moderate ([place]) 01/01/2020   Septic shock ([place]) 12/15/2019   Community acquired pneumonia 11/28/2019   Cerebral vascular disease 11/25/2019   CIDP (chronic inflammatory demyelinating  polyneuropathy) ([place]) 11/25/2019   Other fatigue 10/25/2019   Deficiency anemia 10/25/2019   Dizziness 10/22/2019   Gait abnormality 10/22/2019   Hx of nonmelanoma skin cancer 03/15/2019   Essential hypertension 04/24/2018   Influenza A 04/17/2018   Sepsis ([place]) 04/17/2018   Chronic diastolic CHF (congestive heart failure) ([place]) 04/17/2018   Skin rash 08/07/2017   Port-A-Cath in place 08/04/2017   Chronic apical periodontitis 04/26/2017   Retained dental roots 04/26/2017   Dental caries 04/26/2017   Chronic periodontitis 04/26/2017   Loose, teeth 04/26/2017   Peripheral polyneuropathy 04/14/2017   COPD mixed type ([place]) 02/23/2017   Chronic sinusitis 01/27/2017   Splenomegaly, congestive, chronic 12/29/2016   Diarrhea 12/01/2016   Poor dentition 12/01/2016   Benign mole 12/01/2016   Pancytopenia, acquired ([place]) 11/03/2016   Odynophagia 01/15/2016   Polycythemia, secondary 06/26/2015   Quality of life palliative care encounter 06/26/2015   Atherosclerosis of extremity with intermittent claudication ([place]) 01/23/2015   Lateral epicondylitis of left elbow    ECRB (extensor carpi radialis brevis) tenosynovitis    Anxiety 01/30/2014   Preoperative clearance 01/30/2014   Thrombocytopenia ([place]) 01/30/2014   Diabetes mellitus without complication ([place]) 77/82/4235   Hypogammaglobulinemia, acquired ([place]) 12/26/2012   Hereditary and idiopathic peripheral neuropathy 08/20/2012   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[place]) 08/20/2012   CAD (coronary artery disease) 07/29/2011   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

## 2020-12-08 ENCOUNTER — Ambulatory Visit (HOSPITAL_COMMUNITY): Payer: Medicare Other | Admitting: Anesthesiology

## 2020-12-08 ENCOUNTER — Ambulatory Visit (HOSPITAL_COMMUNITY): Payer: Medicare Other

## 2020-12-08 ENCOUNTER — Encounter (HOSPITAL_COMMUNITY)
Admission: RE | Disposition: A | Discharge status: Home / Self Care | Payer: Self-pay | Source: Ambulatory Visit | Attending: Pulmonary Disease

## 2020-12-08 ENCOUNTER — Encounter (HOSPITAL_COMMUNITY): Payer: Self-pay | Admitting: Pulmonary Disease

## 2020-12-08 ENCOUNTER — Ambulatory Visit (HOSPITAL_COMMUNITY)
Admission: RE | Admit: 2020-12-08 | Discharge: 2020-12-08 | Disposition: A | Discharge status: Home / Self Care | Payer: Medicare Other | Source: Ambulatory Visit | Attending: Pulmonary Disease | Admitting: Pulmonary Disease

## 2020-12-08 DIAGNOSIS — C3412 Malignant neoplasm of upper lobe, left bronchus or lung: Secondary | ICD-10-CM | POA: Insufficient documentation

## 2020-12-08 DIAGNOSIS — Z801 Family history of malignant neoplasm of trachea, bronchus and lung: Secondary | ICD-10-CM | POA: Diagnosis not present

## 2020-12-08 DIAGNOSIS — Z79899 Other long term (current) drug therapy: Secondary | ICD-10-CM | POA: Diagnosis not present

## 2020-12-08 DIAGNOSIS — Z7984 Long term (current) use of oral hypoglycemic drugs: Secondary | ICD-10-CM | POA: Diagnosis not present

## 2020-12-08 DIAGNOSIS — Z87891 Personal history of nicotine dependence: Secondary | ICD-10-CM | POA: Insufficient documentation

## 2020-12-08 DIAGNOSIS — Z885 Allergy status to narcotic agent status: Secondary | ICD-10-CM | POA: Insufficient documentation

## 2020-12-08 DIAGNOSIS — C3490 Malignant neoplasm of unspecified part of unspecified bronchus or lung: Secondary | ICD-10-CM

## 2020-12-08 DIAGNOSIS — R911 Solitary pulmonary nodule: Secondary | ICD-10-CM

## 2020-12-08 DIAGNOSIS — Z419 Encounter for procedure for purposes other than remedying health state, unspecified: Secondary | ICD-10-CM

## 2020-12-08 DIAGNOSIS — C9110 Chronic lymphocytic leukemia of B-cell type not having achieved remission: Secondary | ICD-10-CM | POA: Diagnosis not present

## 2020-12-08 DIAGNOSIS — C8308 Small cell B-cell lymphoma, lymph nodes of multiple sites: Secondary | ICD-10-CM

## 2020-12-08 DIAGNOSIS — Z9889 Other specified postprocedural states: Secondary | ICD-10-CM

## 2020-12-08 HISTORY — PX: BRONCHIAL NEEDLE ASPIRATION BIOPSY: SHX5106

## 2020-12-08 HISTORY — PX: VIDEO BRONCHOSCOPY WITH RADIAL ENDOBRONCHIAL ULTRASOUND: SHX6849

## 2020-12-08 HISTORY — PX: BRONCHIAL BIOPSY: SHX5109

## 2020-12-08 HISTORY — PX: VIDEO BRONCHOSCOPY WITH ENDOBRONCHIAL NAVIGATION: SHX6175

## 2020-12-08 HISTORY — PX: BRONCHIAL BRUSHINGS: SHX5108

## 2020-12-08 HISTORY — PX: FIDUCIAL MARKER PLACEMENT: SHX6858

## 2020-12-08 LAB — GLUCOSE, CAPILLARY: Glucose-Capillary: 105 mg/dL — ABNORMAL HIGH (ref 70–99)

## 2020-12-08 SURGERY — VIDEO BRONCHOSCOPY WITH ENDOBRONCHIAL NAVIGATION
Anesthesia: General | Laterality: Left

## 2020-12-08 MED ORDER — GLYCOPYRROLATE PF 0.2 MG/ML IJ SOSY
PREFILLED_SYRINGE | INTRAMUSCULAR | Status: DC | PRN
  Administered 2020-12-08: .2 mg via INTRAVENOUS

## 2020-12-08 MED ORDER — DEXMEDETOMIDINE (PRECEDEX) IN NS 20 MCG/5ML (4 MCG/ML) IV SYRINGE
PREFILLED_SYRINGE | INTRAVENOUS | Status: DC | PRN
  Administered 2020-12-08: 8 ug via INTRAVENOUS

## 2020-12-08 MED ORDER — FENTANYL CITRATE (PF) 100 MCG/2ML IJ SOLN
INTRAMUSCULAR | Status: DC | PRN
  Administered 2020-12-08: 50 ug via INTRAVENOUS

## 2020-12-08 MED ORDER — PHENYLEPHRINE 40 MCG/ML (10ML) SYRINGE FOR IV PUSH (FOR BLOOD PRESSURE SUPPORT)
PREFILLED_SYRINGE | INTRAVENOUS | Status: DC | PRN
  Administered 2020-12-08 (×4): 80 ug via INTRAVENOUS
  Administered 2020-12-08 (×2): 40 ug via INTRAVENOUS

## 2020-12-08 MED ORDER — PROPOFOL 10 MG/ML IV BOLUS
INTRAVENOUS | Status: DC | PRN
  Administered 2020-12-08: 120 mg via INTRAVENOUS

## 2020-12-08 MED ORDER — CHLORHEXIDINE GLUCONATE 0.12 % MT SOLN
OROMUCOSAL | Status: AC
  Administered 2020-12-08: 15 mL
  Filled 2020-12-08: qty 15

## 2020-12-08 MED ORDER — LIDOCAINE 2% (20 MG/ML) 5 ML SYRINGE
INTRAMUSCULAR | Status: DC | PRN
  Administered 2020-12-08: 80 mg via INTRAVENOUS

## 2020-12-08 MED ORDER — ONDANSETRON HCL 4 MG/2ML IJ SOLN
INTRAMUSCULAR | Status: DC | PRN
  Administered 2020-12-08: 4 mg via INTRAVENOUS

## 2020-12-08 MED ORDER — EPHEDRINE SULFATE-NACL 50-0.9 MG/10ML-% IV SOSY
PREFILLED_SYRINGE | INTRAVENOUS | Status: DC | PRN
Start: 2020-12-08 — End: 2020-12-08
  Administered 2020-12-08: 10 mg via INTRAVENOUS

## 2020-12-08 MED ORDER — PHENYLEPHRINE HCL-NACL 20-0.9 MG/250ML-% IV SOLN
INTRAVENOUS | Status: DC | PRN
  Administered 2020-12-08: 20 ug/min via INTRAVENOUS

## 2020-12-08 MED ORDER — SUGAMMADEX SODIUM 200 MG/2ML IV SOLN
INTRAVENOUS | Status: DC | PRN
  Administered 2020-12-08: 100 mg via INTRAVENOUS
  Administered 2020-12-08: 200 mg via INTRAVENOUS

## 2020-12-08 MED ORDER — ROCURONIUM BROMIDE 10 MG/ML (PF) SYRINGE
PREFILLED_SYRINGE | INTRAVENOUS | Status: DC | PRN
  Administered 2020-12-08 (×2): 50 mg via INTRAVENOUS

## 2020-12-08 MED ORDER — LACTATED RINGERS IV SOLN: INTRAVENOUS | Status: DC

## 2020-12-08 SURGICAL SUPPLY — 46 items

## 2020-12-08 NOTE — Interval H&P Note (Signed)
History and Physical Interval Note:  12/08/2020 12:13 PM  Ryan Copeland  has presented today for surgery, with the diagnosis of lung nodule.  The various methods of treatment have been discussed with the patient and family. After consideration of risks, benefits and other options for treatment, the patient has consented to  Procedure(s) with comments: [name] (Left) - ION w/ fiducial placement as a surgical intervention.  The patient's history has been reviewed, patient examined, no change in status, stable for surgery.  I have reviewed the patient's chart and labs.  Questions were answered to the patient's satisfaction.     [facility]

## 2020-12-08 NOTE — Anesthesia Procedure Notes (Signed)
Procedure Name: Intubation Date/Time: 12/08/2020 12:50 PM Performed by: Vonna Drafts, CRNA Pre-anesthesia Checklist: Patient identified, Emergency Drugs available, Suction available and Patient being monitored Patient Re-evaluated:Patient Re-evaluated prior to induction Oxygen Delivery Method: Circle system utilized Preoxygenation: Pre-oxygenation with 100% oxygen Induction Type: IV induction Ventilation: Mask ventilation without difficulty Laryngoscope Size: Mac and 4 Grade View: Grade I Tube type: Oral Tube size: 8.5 mm Number of attempts: 1 Airway Equipment and Method: Stylet and Oral airway Placement Confirmation: ETT inserted through vocal cords under direct vision, positive ETCO2 and breath sounds checked- equal and bilateral Secured at: 24 cm Tube secured with: Tape Dental Injury: Teeth and Oropharynx as per pre-operative assessment

## 2020-12-08 NOTE — Anesthesia Postprocedure Evaluation (Signed)
Anesthesia Post Note  Patient: Ryan Copeland  Procedure(s) Performed: VIDEO BRONCHOSCOPY WITH ENDOBRONCHIAL NAVIGATION (Left) BRONCHIAL BIOPSIES BRONCHIAL NEEDLE ASPIRATION BIOPSIES BRONCHIAL BRUSHINGS RADIAL ENDOBRONCHIAL ULTRASOUND FIDUCIAL MARKER PLACEMENT     Patient location during evaluation: PACU Anesthesia Type: General Level of consciousness: awake and alert Pain management: pain level controlled Vital Signs Assessment: post-procedure vital signs reviewed and stable Respiratory status: spontaneous breathing, nonlabored ventilation, respiratory function stable and patient connected to nasal cannula oxygen Cardiovascular status: blood pressure returned to baseline and stable Postop Assessment: no apparent nausea or vomiting Anesthetic complications: no   No notable events documented.  Last Vitals:  Vitals:   12/08/20 1446 12/08/20 1455  BP: (!) 106/52 (!) 116/91  Pulse: 79 83  Resp: 18 16  Temp:    SpO2: 97% 95%    Last Pain:  Vitals:   12/08/20 1455  TempSrc:   PainSc: 0-No pain                 Alexina Niccoli L Danaja [place]

## 2020-12-08 NOTE — Op Note (Signed)
Video Bronchoscopy with Robotic Assisted Bronchoscopic Navigation   Date of Operation: 12/08/2020   Pre-op Diagnosis: Pulmonary nodules  Post-op Diagnosis: Pulmonary nodule  Surgeon: [name], DO   Assistants: None   Anesthesia: General endotracheal anesthesia  Operation: Flexible video fiberoptic bronchoscopy with robotic assistance and biopsies.  Estimated Blood Loss: Minimal  Complications: None  Indications and History: Ryan Copeland is a 70 y.o. male with history of multiple pulmonary nodules, CLL. The risks, benefits, complications, treatment options and expected outcomes were discussed with the patient.  The possibilities of pneumothorax, pneumonia, reaction to medication, pulmonary aspiration, perforation of a viscus, bleeding, failure to diagnose a condition and creating a complication requiring transfusion or operation were discussed with the patient who freely signed the consent.    Description of Procedure: The patient was seen in the Preoperative Area, was examined and was deemed appropriate to proceed.  The patient was taken to [facility] endoscopy room 3, identified as Ryan Copeland and the procedure verified as Flexible Video Fiberoptic Bronchoscopy.  A Time Out was held and the above information confirmed.   Prior to the date of the procedure a high-resolution CT scan of the chest was performed. Utilizing ION software program a virtual tracheobronchial tree was generated to allow the creation of distinct navigation pathways to the patient's parenchymal abnormalities. After being taken to the operating room general anesthesia was initiated and the patient  was orally intubated. The video fiberoptic bronchoscope was introduced via the endotracheal tube and a general inspection was performed which showed normal right and left lung anatomy, aspiration of the bilateral mainstems was completed to remove any remaining secretions. Robotic catheter inserted into patient's  endotracheal tube.   Target #1 left upper lobe: The distinct navigation pathways prepared prior to this procedure were then utilized to navigate to patient's lesion identified on CT scan. The robotic catheter was secured into place and the vision probe was withdrawn.  Lesion location was approximated using fluoroscopy and radial endobronchial ultrasound for peripheral targeting. Under fluoroscopic guidance transbronchial needle brushings, transbronchial needle biopsies, and transbronchial forceps biopsies were performed to be sent for cytology and pathology.  Following tissue sampling a single fiducial was placed with the fiducial catheter wire delivery kit.   Target #2 right upper lobe: The distinct navigation pathways prepared prior to this procedure were then utilized to navigate to patient's lesion identified on CT scan. The robotic catheter was secured into place and the vision probe was withdrawn.  Lesion location was approximated using fluoroscopy and radial endobronchial ultrasound for peripheral targeting. Under fluoroscopic guidance transbronchial needle brushings, transbronchial needle biopsies, and transbronchial forceps biopsies were performed to be sent for cytology and pathology.  Following tissue sampling a single fiducial was placed using the fiducial catheter wire delivery kit under direct fluoroscopy.  A bronchioalveolar lavage was performed in the right upper lobe and sent for cytology.  At the end of the procedure a general airway inspection was performed and there was no evidence of active bleeding. The bronchoscope was removed.  The patient tolerated the procedure well. There was no significant blood loss and there were no obvious complications. A post-procedural chest x-ray is pending.  Samples Target #1: 1. Transbronchial needle brushings from left upper lobe 2. Transbronchial Wang needle biopsies from left upper lobe 3. Transbronchial forceps biopsies from left upper  lobe  Samples Target #2: 1. Transbronchial needle brushings from right upper lobe 2. Transbronchial Wang needle biopsies from right upper lobe 3. Transbronchial forceps biopsies  from right upper lobe 4. Bronchoalveolar lavage from right upper lobe  Plans:  The patient will be discharged from the PACU to home when recovered from anesthesia and after chest x-ray is reviewed. We will review the cytology, pathology and microbiology results with the patient when they become available. Outpatient followup will be with [name], DO.  [name], DO [facility] 12/08/2020 2:22 PM

## 2020-12-08 NOTE — Discharge Instructions (Signed)
Flexible Bronchoscopy, Care After This sheet gives you information about how to care for yourself after your test. Your doctor may also give you more specific instructions. If you have problems or questions, contact your doctor. Follow these instructions at home: Eating and drinking Do not eat or drink anything (not even water) for 2 hours after your test, or until your numbing medicine (local anesthetic) wears off. When your numbness is gone and your cough and gag reflexes have come back, you may: Eat only soft foods. Slowly drink liquids. The day after the test, go back to your normal diet. Driving Do not drive for 24 hours if you were given a medicine to help you relax (sedative). Do not drive or use heavy machinery while taking prescription pain medicine. General instructions  Take over-the-counter and prescription medicines only as told by your doctor. Return to your normal activities as told. Ask what activities are safe for you. Do not use any products that have nicotine or tobacco in them. This includes cigarettes and e-cigarettes. If you need help quitting, ask your doctor. Keep all follow-up visits as told by your doctor. This is important. It is very important if you had a tissue sample (biopsy) taken. Get help right away if: You have shortness of breath that gets worse. You get light-headed. You feel like you are going to pass out (faint). You have chest pain. You cough up: More than a little blood. More blood than before. Summary Do not eat or drink anything (not even water) for 2 hours after your test, or until your numbing medicine wears off. Do not use cigarettes. Do not use e-cigarettes. Get help right away if you have chest pain.  This information is not intended to replace advice given to you by your health care provider. Make sure you discuss any questions you have with your health care provider. Document Released: 11/28/2008 Document Revised: 01/13/2017 Document  Reviewed: 02/19/2016 Elsevier Patient Education  2020 [employer].

## 2020-12-08 NOTE — Anesthesia Preprocedure Evaluation (Addendum)
Anesthesia Evaluation  Patient identified by MRN, date of birth, ID band Patient awake    Reviewed: Allergy & Precautions, NPO status , Patient's Chart, lab work & pertinent test results, Unable to perform ROS - Chart review only  Airway Mallampati: I  TM Distance: >3 FB Neck ROM: Full    Dental  (+) Edentulous Upper, Edentulous Lower, Dental Advisory Given   Pulmonary pneumonia, COPD, Current Smoker, former smoker,    Pulmonary exam normal        Cardiovascular hypertension, + CAD, + Past MI, + Cardiac Stents, + Peripheral Vascular Disease and +CHF  Normal cardiovascular exam  Echo 12/2019 1. Left ventricular ejection fraction, by estimation, is 60 to 65%. The left ventricle has normal function. The left ventricle has no regional wall motion abnormalities. Left ventricular diastolic parameters are consistent with Grade I diastolic dysfunction (impaired relaxation).  2. Right ventricular systolic function is normal. The right ventricular size is normal. There is mildly elevated pulmonary artery systolic pressure. The estimated right ventricular systolic pressure is 76.7 mmHg.  3. Left atrial size was moderately dilated.  4. The mitral valve is abnormal. Mild mitral valve regurgitation.  5. The aortic valve is tricuspid. Aortic valve regurgitation is not visualized.  6. Aortic dilatation noted. There is mild dilatation of the aortic root, measuring 39 mm.  7. The inferior vena cava is dilated in size with <50% respiratory variability, suggesting right atrial pressure of 15 mmHg.    Neuro/Psych Anxiety  Neuromuscular disease    GI/Hepatic GERD  Medicated and Controlled,  Endo/Other  diabetes, Well Controlled, Type 2, Oral Hypoglycemic Agents  Renal/GU      Musculoskeletal  (+) Arthritis ,   Abdominal   Peds  Hematology  (+) Blood dyscrasia, anemia , CLL   Anesthesia Other Findings   Reproductive/Obstetrics                             Anesthesia Physical  Anesthesia Plan  ASA: 3  Anesthesia Plan: General   Post-op Pain Management:    Induction: Intravenous  PONV Risk Score and Plan: 3 and Ondansetron, Midazolam and Treatment may vary due to age or medical condition  Airway Management Planned: Oral ETT  Additional Equipment:   Intra-op Plan:   Post-operative Plan: Extubation in OR  Informed Consent: I have reviewed the patients History and Physical, chart, labs and discussed the procedure including the risks, benefits and alternatives for the proposed anesthesia with the patient or authorized representative who has indicated his/her understanding and acceptance.     Dental advisory given  Plan Discussed with: Anesthesiologist and CRNA  Anesthesia Plan Comments:        Anesthesia Quick Evaluation

## 2020-12-08 NOTE — Transfer of Care (Signed)
Immediate Anesthesia Transfer of Care Note  Patient: Ryan Copeland  Procedure(s) Performed: VIDEO BRONCHOSCOPY WITH ENDOBRONCHIAL NAVIGATION (Left) BRONCHIAL BIOPSIES BRONCHIAL NEEDLE ASPIRATION BIOPSIES BRONCHIAL BRUSHINGS RADIAL ENDOBRONCHIAL ULTRASOUND FIDUCIAL MARKER PLACEMENT  Patient Location: PACU and Endoscopy Unit  Anesthesia Type:General  Level of Consciousness: drowsy  Airway & Oxygen Therapy: Patient Spontanous Breathing and Patient connected to nasal cannula oxygen  Post-op Assessment: Report given to RN and Post -op Vital signs reviewed and stable  Post vital signs: Reviewed and stable  Last Vitals:  Vitals Value Taken Time  BP 117/46 12/08/20 1416  Temp    Pulse 87 12/08/20 1419  Resp 20 12/08/20 1419  SpO2 100 % 12/08/20 1419  Vitals shown include unvalidated device data.  Last Pain:  Vitals:   12/08/20 1416  TempSrc:   PainSc: 0-No pain         Complications: No notable events documented.

## 2020-12-09 LAB — CYTOLOGY - NON PAP

## 2020-12-10 ENCOUNTER — Encounter: Payer: Self-pay | Admitting: Rehabilitation

## 2020-12-10 ENCOUNTER — Other Ambulatory Visit: Payer: Self-pay

## 2020-12-10 ENCOUNTER — Ambulatory Visit: Payer: Medicare Other | Admitting: Rehabilitation

## 2020-12-10 DIAGNOSIS — M6281 Muscle weakness (generalized): Secondary | ICD-10-CM | POA: Diagnosis not present

## 2020-12-10 DIAGNOSIS — R2689 Other abnormalities of gait and mobility: Secondary | ICD-10-CM

## 2020-12-10 DIAGNOSIS — C3492 Malignant neoplasm of unspecified part of left bronchus or lung: Secondary | ICD-10-CM

## 2020-12-10 DIAGNOSIS — R262 Difficulty in walking, not elsewhere classified: Secondary | ICD-10-CM

## 2020-12-10 LAB — CULTURE, BAL-QUANTITATIVE W GRAM STAIN: Culture: NO GROWTH

## 2020-12-10 LAB — ACID FAST SMEAR (AFB, MYCOBACTERIA): Acid Fast Smear: NEGATIVE

## 2020-12-10 LAB — CYTOLOGY - NON PAP

## 2020-12-10 NOTE — Therapy (Signed)
[facility] @ [address] [place], [place], [address] Phone: [phone]   Fax:  [phone]  Physical Therapy Treatment  Patient Details  Name: Ryan Copeland MRN: [id number] Date of Birth: 10/24/1950 Referring Provider (PT): Alvy Bimler   Encounter Date: 12/10/2020   PT End of Session - 12/10/20 1413     Visit Number 7    Number of Visits 13    Date for PT Re-Evaluation 12/23/20    PT Start Time 1330    PT Stop Time 2426    PT Time Calculation (min) 42 min    Activity Tolerance Patient tolerated treatment well    Behavior During Therapy [facility] for tasks assessed/performed             Past Medical History:  Diagnosis Date   Anxiety 01/30/2014   Back injury    lower disc   CAD (coronary artery disease)    CLL (chronic lymphocytic leukemia) ([place]) 03/18/2011   COPD (chronic obstructive pulmonary disease) ([place])    DM type 2 (diabetes mellitus, type 2) ([place])    ECRB (extensor carpi radialis brevis) tenosynovitis    Foley catheter in place 11/2019   last changed 04-14-2020   GERD (gastroesophageal reflux disease)    takes Nexium if needed   History of blood transfusion 2015   Hyperlipidemia    Insomnia    Long-term current use of intravenous immunoglobulin (IVIG)    q month ivig   MI, acute, non ST segment elevation ([place]) 06/28/2009   with stenting of the LAD   Neuromuscular disorder ([place])    peripheral neuropathy both all toes   Pneumonia 11/2019   10/2020   PVD (peripheral vascular disease) ([place])    Thrombocytopenia ([place])    Tobacco abuse     Past Surgical History:  Procedure Laterality Date   ADENOIDECTOMY  1955   BRONCHIAL BIOPSY  12/08/2020   Procedure: BRONCHIAL BIOPSIES;  Surgeon: [name], DO;  Location: [facility];  Service: Pulmonary;;   BRONCHIAL BRUSHINGS  12/08/2020   Procedure: BRONCHIAL BRUSHINGS;  Surgeon: [name], DO;  Location: [facility];  Service: Pulmonary;;    BRONCHIAL NEEDLE ASPIRATION BIOPSY  12/08/2020   Procedure: BRONCHIAL NEEDLE ASPIRATION BIOPSIES;  Surgeon: [name], DO;  Location: [facility];  Service: Pulmonary;;   CARDIAC CATHETERIZATION     CARDIAC CATHETERIZATION N/A 09/26/2014   Procedure: Left Heart Cath and Coronary Angiography;  Surgeon: Peter M Martinique, MD;  Location: [facility];  Service: Cardiovascular;  Laterality: N/A;   carpel tunnel release Left 04-1989   carpel tunnel release  Right 01-1989   CHOLECYSTECTOMY  2007   [place]   CORONARY STENT PLACEMENT  06/2009   stent to lad   CYSTOSCOPY N/A 04/20/2020   Procedure: [place];  Surgeon: Janith Lima, MD;  Location: [facility];  Service: Urology;  Laterality: N/A;  ONLY NEEDS 30 MIN   femoral stents  09/2014   iliac stent   FIDUCIAL MARKER PLACEMENT  12/08/2020   Procedure: FIDUCIAL MARKER PLACEMENT;  Surgeon: [name], DO;  Location: [facility];  Service: Pulmonary;;   IR CV LINE INJECTION  08/18/2017   IR CV LINE INJECTION  09/01/2017   IR CV LINE INJECTION  02/02/2018   LATERAL EPICONDYLE RELEASE Left 02/12/2014   Procedure: LEFT ELBOW DEBRIDEMENT WITH TENDON REPAIR ;  Surgeon: Lorn Junes, MD;  Location: [facility];  Service: Orthopedics;  Laterality: Left;   LEFT CAI STENT/PTA AND POPLITEAL ARTERY/TIBIAL THROMBECTOMY      LEFT HEART CATHETERIZATION WITH CORONARY ANGIOGRAM N/A 08/26/2011   Procedure: LEFT HEART CATHETERIZATION WITH CORONARY ANGIOGRAM;  Surgeon: Peter M Martinique, MD;  Location: [facility];  Service: Cardiovascular;  Laterality: N/A;   PERIPHERAL VASCULAR CATHETERIZATION N/A 01/01/2015   Procedure: Abdominal Aortogram;  Surgeon: Conrad Frenchburg, MD;  Location: [facility];  Service: Cardiovascular;  Laterality: N/A;   port a cath insertion  2014   right    TARSAL TUNNEL RELEASE Bilateral 08-2007   VIDEO BRONCHOSCOPY WITH ENDOBRONCHIAL NAVIGATION Left 12/08/2020   Procedure: VIDEO  BRONCHOSCOPY WITH ENDOBRONCHIAL NAVIGATION;  Surgeon: [name], DO;  Location: [facility];  Service: Pulmonary;  Laterality: Left;  ION w/ fiducial placement   VIDEO BRONCHOSCOPY WITH RADIAL ENDOBRONCHIAL ULTRASOUND  12/08/2020   Procedure: RADIAL ENDOBRONCHIAL ULTRASOUND;  Surgeon: [name], DO;  Location: [facility];  Service: Pulmonary;;    There were no vitals filed for this visit.   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 No/denies                               [facility] Adult PT Treatment/Exercise - 12/10/20 0001       Knee/Hip Exercises: Aerobic   Nustep Level 5 UE/LE,  arms at 12, x10 mins with therapist monitoring pt throughout for fatigue      Knee/Hip Exercises: Standing   Hip Flexion Both;10 reps    Hip Flexion Limitations 3#    Hip Abduction Both;10 reps   3#   Hip Extension Both;10 reps    Extension Limitations 3#    Forward Step Up 10 reps;Hand Hold: 2    Forward Step Up Limitations onto bosu ball    Other Standing Knee Exercises lunge onto bosu ball with hand hold on bars; diffulty without hand hold x 10      Knee/Hip Exercises: Seated   Long Arc Quad Both;10 reps    Long Arc Quad Weight 3 lbs.    Heel Slides Both;10 reps    Heel Slides Limitations red band resistance                          PT Long Term Goals - 11/11/20 1456       PT LONG TERM GOAL #1   Title Pt will be able to sit to stand from a chair 8 times in 30 seconds without use of UEs to decrease fall risk.    Baseline has to use UEs    Time 6    Period Weeks    Status New    Target Date  12/23/20      PT LONG TERM GOAL #2   Title Pt will demonstrate 4/5 bilateral hip extensor strength to help decrease fall risk    Baseline 2+/5    Time 6    Period Weeks    Status New    Target Date 12/23/20      PT LONG TERM GOAL #3  Title Pt will demonstrate 5/5 bilateral hip flexor strength to decrease fall risk    Baseline 3/5    Time 6    Period Weeks    Status New    Target Date 12/23/20      PT LONG TERM GOAL #4   Title Pt will demonstrate 5/5 bilateral shoulder flexion strength to allow pt to return to PLOF where he was able to lift 50 lb bags of gravel.    Baseline 3/5    Time 6    Period Weeks    Status New    Target Date 12/23/20      PT LONG TERM GOAL #5   Title Pt will be able to get in his truck without increased difficulty.    Baseline pt has difficulty    Time 6    Period Weeks    Status New    Target Date 12/23/20      Additional Long Term Goals   Additional Long Term Goals Yes      PT LONG TERM GOAL #6   Title Pt will be able to ascend 1 flight of steps with step through gait pattern and only 1 hand hold assist to decrease fall risk    Baseline step to gait pattern    Time 6    Period Weeks    Status New    Target Date 12/23/20      PT LONG TERM GOAL #7   Title Pt will be independent in a home exercise program for long term stretching and strengthening    Time 6    Period Weeks    Target Date 12/23/20                   Plan - 12/10/20 1414     Clinical Impression Statement Pt more fatigued today due to general anesthesia 2 days ago for a lung biopsy.  performed all TE but less energy overall.    PT Next Visit Plan Cont LE strengthening , stair ambulation/step ups, high level balance,    Consulted and Agree with Plan of Care Patient             Patient will benefit from skilled therapeutic intervention in order to improve the following deficits and impairments:     Visit Diagnosis: Other abnormalities of gait and  mobility  Muscle weakness (generalized)  Non-small cell carcinoma of left lung (HCC)  Difficulty in walking, not elsewhere classified     Problem List Patient Active Problem List   Diagnosis Date Noted   Bilateral leg edema 10/27/2020   Physical debility 10/27/2020   Lung nodule 09/23/2020   Non-small cell carcinoma of lung, left ([place]) 09/18/2020   Head and neck lymphadenopathy 37/85/8850   Complicated UTI (urinary tract infection) 07/18/2020   Gum lesion 05/29/2020   Acute lower UTI 02/16/2020   Bladder pain 02/16/2020   Chronic hypotension 02/16/2020   Vitamin B12 deficiency 01/01/2020   Hypotension due to drugs 01/01/2020   Protein-calorie malnutrition, moderate ([place]) 01/01/2020   Septic shock ([place]) 12/15/2019   Community acquired pneumonia 11/28/2019   Cerebral vascular disease 11/25/2019   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   Other fatigue 10/25/2019   Deficiency anemia 10/25/2019   Dizziness 10/22/2019   Gait abnormality 10/22/2019   Hx of nonmelanoma skin cancer 03/15/2019   Essential hypertension 04/24/2018   Influenza A 04/17/2018   Sepsis ([place]) 04/17/2018   Chronic diastolic CHF (congestive heart failure) ([place]) 04/17/2018  Skin rash 08/07/2017   Port-A-Cath in place 08/04/2017   Chronic apical periodontitis 04/26/2017   Retained dental roots 04/26/2017   Dental caries 04/26/2017   Chronic periodontitis 04/26/2017   Loose, teeth 04/26/2017   Peripheral polyneuropathy 04/14/2017   COPD mixed type ([place]) 02/23/2017   Chronic sinusitis 01/27/2017   Splenomegaly, congestive, chronic 12/29/2016   Diarrhea 12/01/2016   Poor dentition 12/01/2016   Benign mole 12/01/2016   Pancytopenia, acquired ([place]) 11/03/2016   Odynophagia 01/15/2016   Polycythemia, secondary 06/26/2015   Quality of life palliative care encounter 06/26/2015   Atherosclerosis of extremity with intermittent claudication ([place]) 01/23/2015   Lateral  epicondylitis of left elbow    ECRB (extensor carpi radialis brevis) tenosynovitis    Anxiety 01/30/2014   Preoperative clearance 01/30/2014   Thrombocytopenia ([place]) 01/30/2014   Diabetes mellitus without complication ([place]) 75/64/3329   Hypogammaglobulinemia, acquired ([place]) 12/26/2012   Hereditary and idiopathic peripheral neuropathy 08/20/2012   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

## 2020-12-11 NOTE — Progress Notes (Signed)
Called spoke with pathology results.  Tissue biopsy consistent with non-small cell lung cancer.  Has follow-up with NSG on Monday.  Will need referral to radiation oncology.  Thanks,  BLI  [name], DO [facility] 12/11/2020 5:21 PM

## 2020-12-14 ENCOUNTER — Telehealth: Payer: Self-pay | Admitting: Radiation Oncology

## 2020-12-14 ENCOUNTER — Encounter: Payer: Self-pay | Admitting: Acute Care

## 2020-12-14 ENCOUNTER — Inpatient Hospital Stay (HOSPITAL_BASED_OUTPATIENT_CLINIC_OR_DEPARTMENT_OTHER): Payer: Medicare Other | Admitting: Hematology and Oncology

## 2020-12-14 ENCOUNTER — Ambulatory Visit (INDEPENDENT_AMBULATORY_CARE_PROVIDER_SITE_OTHER): Payer: Medicare Other | Admitting: Acute Care

## 2020-12-14 ENCOUNTER — Encounter: Payer: Self-pay | Admitting: Hematology and Oncology

## 2020-12-14 ENCOUNTER — Other Ambulatory Visit: Payer: Self-pay

## 2020-12-14 VITALS — BP 129/61 | HR 65 | Temp 96.8°F | Resp 17 | Wt 172.6 lb

## 2020-12-14 VITALS — BP 122/78 | HR 68 | Temp 98.5°F | Ht 72.0 in | Wt 172.4 lb

## 2020-12-14 DIAGNOSIS — I259 Chronic ischemic heart disease, unspecified: Secondary | ICD-10-CM | POA: Diagnosis not present

## 2020-12-14 DIAGNOSIS — M3500 Sicca syndrome, unspecified: Secondary | ICD-10-CM

## 2020-12-14 DIAGNOSIS — C3492 Malignant neoplasm of unspecified part of left bronchus or lung: Secondary | ICD-10-CM

## 2020-12-14 DIAGNOSIS — C8308 Small cell B-cell lymphoma, lymph nodes of multiple sites: Secondary | ICD-10-CM

## 2020-12-14 DIAGNOSIS — D801 Nonfamilial hypogammaglobulinemia: Secondary | ICD-10-CM | POA: Diagnosis not present

## 2020-12-14 DIAGNOSIS — J449 Chronic obstructive pulmonary disease, unspecified: Secondary | ICD-10-CM

## 2020-12-14 NOTE — Progress Notes (Signed)
[facility] OFFICE PROGRESS NOTE  Patient Care Team: Ryan Halim., PA-C as PCP - General (Family Medicine) Ryan Junes, NP (Inactive) as Nurse Practitioner (Cardiology) Ryan Ada, MD as Consulting Physician (Gastroenterology)  ASSESSMENT & PLAN:  Non-small cell carcinoma of lung, left ([place]) I gave the patient a copy of the pathology report I spoke with the pathologist myself, he favor adenocarcinoma but due to scant tissue available, he is not able to order additional immunostains The patient is not a candidate for surgery The pulmonologist team has referred him to see radiation oncologist for discussion about the role of definitive radiation treatment Unfortunately, due to his profound immunocompromise state and chronic pancytopenia, he is not a candidate for concurrent systemic chemotherapy either I favor ordering guardant 360/next generation sequencing test to look for actionable mutations For now, I recommend he continues his treatment for CLL At the completion of radiation treatment, we will repeat PET/CT imaging to look for residual disease I acknowledge the difficulties of interpreting his imaging study due to concurrent lymphadenopathy from CLL He is in agreement with the plan of care  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 The patient knows to call the clinic with any problems, questions or concerns. The total time spent in the appointment was 40 minutes encounter with patients  including review of chart and various tests results, discussions about plan of care and coordination of care plan   Ryan Lark, MD 12/14/2020 3:51 PM  INTERVAL HISTORY: Please see below for problem oriented charting. he returns for further follow-up on recent diagnosis of non-small cell lung cancer He returns with his wife He tolerated recent bronchoscopy well Denies bleeding after biopsy He has mild cough, stable  REVIEW OF SYSTEMS:   Constitutional: Denies fevers, chills or abnormal weight loss Eyes: Denies blurriness of vision Ears, nose, mouth, throat, and face: Denies mucositis or sore throat Cardiovascular: Denies palpitation, chest discomfort or lower extremity swelling Gastrointestinal:  Denies nausea, heartburn or change in bowel habits Skin: Denies abnormal skin rashes Lymphatics: Denies new lymphadenopathy or easy bruising Neurological:Denies numbness, tingling or new weaknesses Behavioral/Psych: Mood is stable, no new changes  All other systems were reviewed with the patient and are negative.  I have reviewed the past medical history, past surgical history, social history and family history with the patient and they are unchanged from previous note.  ALLERGIES:  is allergic to immune globulin and codeine.  MEDICATIONS:  Current Outpatient Medications  Medication Sig Dispense Refill   Acalabrutinib Maleate (CALQUENCE) 100 MG TABS Take 1 tablet (100 mg) by mouth 2 (two) times daily. 60 tablet 1   acetaminophen (TYLENOL) 325 MG tablet Take 2 tablets (650 mg total) by mouth every 6 (six) hours as needed for moderate pain or fever. 30 tablet 0   acyclovir (ZOVIRAX) 400 MG tablet TAKE 1 TABLET BY MOUTH TWICE A DAY (Patient taking differently: Take 400 mg by mouth 2 (two) times daily.) 180 tablet 1  allopurinol (ZYLOPRIM) 300 MG tablet Take 1 tablet (300 mg total) by mouth daily. 90 tablet 1   ALPRAZolam (XANAX) 0.5 MG tablet Take 0.5 mg by mouth at bedtime as needed for  anxiety.     b complex vitamins tablet Take 1 tablet by mouth daily.      benzonatate (TESSALON) 100 MG capsule TAKE TWO CAPSULES BY MOUTH THREE TIMES A DAY AS NEEDED 90 capsule 1   Cholecalciferol 25 MCG (1000 UT) tablet Take 1,000 Units by mouth daily.      diphenoxylate-atropine (LOMOTIL) 2.5-0.025 MG tablet TAKE 1 TABLET BY MOUTH 4 (FOUR) TIMES DAILY AS NEEDED FOR DIARRHEA OR LOOSE STOOLS. 60 tablet 1   famotidine (PEPCID) 20 MG tablet TAKE 1 TABLET BY MOUTH TWICE A DAY (Patient taking differently: Take 20 mg by mouth 2 (two) times daily as needed for heartburn.) 180 tablet 1   furosemide (LASIX) 20 MG tablet Take 20 mg by mouth daily.     HYDROcodone-acetaminophen (NORCO) 10-325 MG tablet Take 1 tablet by mouth every 6 (six) hours as needed for moderate pain.     lidocaine-prilocaine (EMLA) cream Apply 1 application topically as needed (prior chemo/IBIG). 30 g 9   loperamide (IMODIUM) 2 MG capsule Take 2 capsules (4 mg total) by mouth 4 (four) times daily. (Patient taking differently: Take 4 mg by mouth 4 (four) times daily as needed for diarrhea or loose stools.) 120 capsule 9   meclizine (ANTIVERT) 12.5 MG tablet Take 12.5 mg by mouth 3 (three) times daily as needed for dizziness.     metFORMIN (GLUCOPHAGE-XR) 500 MG 24 hr tablet Take 500 mg by mouth every evening.     midodrine (PROAMATINE) 5 MG tablet Take 3 tablets (15 mg total) by mouth 3 (three) times daily with meals. 270 tablet 2   montelukast (SINGULAIR) 10 MG tablet TAKE 1 TABLET BY MOUTH EVERY DAY (Patient taking differently: Take 10 mg by mouth at bedtime.) 90 tablet 1   nitroGLYCERIN (NITROSTAT) 0.4 MG SL tablet PLACE 1 TABLET UNDER THE TONGUE EVERY 5 MINUTES X 3 DOSES AS NEEDED FOR CHEST PAIN *MAX 3 DOSES* 25 tablet 7   oxybutynin (DITROPAN) 5 MG tablet Take 5 mg by mouth 2 (two) times daily as needed.     pregabalin (LYRICA) 200 MG capsule TAKE 1 CAPSULE (200 MG TOTAL) BY MOUTH 3 (THREE) TIMES DAILY. 270 capsule 1   rosuvastatin  (CRESTOR) 5 MG tablet Take 1 tablet (5 mg total) by mouth daily. 90 tablet 3   sildenafil (VIAGRA) 100 MG tablet Take 0.5 tablets (50 mg total) by mouth as needed for erectile dysfunction (30 mintues prior to sexual intercourse). 10 tablet 3   tamsulosin (FLOMAX) 0.4 MG CAPS capsule Take 1 capsule (0.4 mg total) by mouth daily. 30 capsule 0   zolpidem (AMBIEN) 10 MG tablet Take 10 mg by mouth at bedtime as needed for sleep.     No current facility-administered medications for this visit.   Facility-Administered Medications Ordered in Other Visits  Medication Dose Route Frequency Provider Last Rate Last Admin   0.9 %  sodium chloride infusion   Intravenous Continuous Alvy Bimler, Apple Dearmas, MD 50 mL/hr at 03/07/14 1005 New Bag at 03/07/14 1005   sodium chloride 0.9 % injection 10 mL  10 mL Intracatheter PRN Marcy Panning, MD   10 mL at 08/22/12 1721    SUMMARY OF ONCOLOGIC HISTORY: Oncology History Overview Note  FISH: del 13q  Prior treatment with FCR, Bendamustine & Rituximab Path from  bronchoscopy scant, unable to order additional immunostains per pathologist-NG 12/14/20   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  No evidence of aortic dissection or other acute process in the chest. 2.  Centrilobular emphysema with a 5 mm right lung nodule. Given the concurrent centrilobular emphysema, follow-up chest CT at 6 -12 months is recommended.  3.  Coronary artery atherosclerosis which is age advanced. 4.  Prominent thoracic lymph nodes.  These can be reevaluated at follow-up.   05/06/2010 Imaging   1.  Multiple small periaortic lymph nodes consistent with the patient's history of the chronic lymphocytic leukemia. 2.  No evidence of solid organ involvement   11/03/2011 Imaging   1.  Interval progression of abdominal and pelvic adenopathy. 2.  Progression of splenomegaly.  The spleen now measures 23 cm in length   11/18/2011 Bone Marrow Biopsy   Bone  Marrow, Aspirate,Biopsy, and Clot, right iliac bone - HYPERCELLULAR BONE MARROW WITH EXTENSIVE INVOLVEMENT BY CHRONIC LYMPHOCYTIC LEUKEMIA. PERIPHERAL BLOOD: - CHRONIC LYMPHOCYTIC LEUKEMIA   03/08/2012 Imaging   1.  Progressive increase in retroperitoneal, iliac, and inguinal lymphadenopathy. 2.  Interval increase in massive splenomegaly.     06/01/2012 Procedure   Placement of single lumen port a cath via right internal jugular vein.  The catheter tip lies at the cavoatrial junction.  A power injectable port a cath was placed and is ready for immediate use   06/20/2012 - 11/23/2012 Chemotherapy   He received FCR x 6 cycles   12/19/2012 Imaging   Left common iliac stent. Abdominal vasculature remains patent. Improving supraclavicular and axillary lymphadenopathy. Residual right subpectoral nodes measure up to 10 mm short axis. Improving retroperitoneal lymphadenopathy, measuring up to 16 mm short axis. Improving splenomegaly, measuring 18.7 cm.     01/20/2016 Imaging   1. Stable exam.  No new or progressive findings. 2. No CT findings to explain odynophagia   09/02/2016 Pathology Results   The findings are consistent with involvement by previously known chronic lymphocytic leukemia   09/02/2016 Pathology Results   FISH for CLL came back positive for deletion 13q   09/15/2016 Imaging   1. Borderline enlarged abdominal and pelvic lymph nodes. Compared with 11/03/2011 these are decreased in size as detailed above. 2. Persistent splenomegaly. 3. Aortic Atherosclerosis (ICD10-I70.0). LAD coronary artery calcification noted.   10/06/2016 - 02/24/2017 Chemotherapy   He received Bendamustine and Rituxan   11/03/2016 Adverse Reaction   Dose of Bendamustine is reduced due to severe pancytopenia   12/28/2016 Imaging   1. Borderline enlarged abdominal peritoneal ligament and abdominal retroperitoneal lymph nodes, stable. 2. Splenomegaly. 3.  Aortic atherosclerosis (ICD10-170.0).   03/22/2017 PET  scan   1. No hypermetabolic adenopathy identified within the neck, chest, abdomen or pelvis. 2. Prominent left retroperitoneal node measures 1.6 cm without significant FDG uptake. 3. Splenomegaly. 4. Aortic Atherosclerosis (ICD10-I70.0) and Emphysema (ICD10-J43.9). LAD and left circumflex atherosclerotic calcifications noted.   02/02/2018 Procedure   IMPRESSION: Widely patent right IJ power port catheter.   04/06/2018 Imaging   1. Interval enlargement of axillary, mediastinal, and retroperitoneal lymph nodes, as well as increased splenomegaly, findings concerning for progression of CLL in comparison to prior PET-CT dated 03/22/2017.   2.  Other chronic and incidental findings as detailed above.     04/16/2018 -  Chemotherapy   The patient had acalabrutinib for chemo   04/17/2018 - 04/21/2018 Hospital Admission   He was hospitalized for influenza  11/29/2018 Imaging   1. Interval response to therapy. No thoracic added not scratch set no thoracic or pelvic adenopathy identified. Significant improvement in abdominal adenopathy. Largest remaining lymph node measures 1.3cm in the left retroperitoneal region. Previously 2.5 cm. 2. Persistent splenomegaly, improved from previous exam. 3. No new or progressive disease identified within the chest, abdomen or pelvis. 4. Stable appearance of 5 mm right middle lobe lung nodule. 5. Aortic Atherosclerosis (ICD10-I70.0) and Emphysema (ICD10-J43.9). Coronary artery calcifications.     09/17/2020 Imaging   CT neck  Negative for mass or adenopathy in the neck. No evidence of recurrent CLL   09/17/2020 Imaging   1. Interval enlargement of a perihilar nodule anteriorly in the left upper lobe, measuring 2.1 x 1.2 cm, previously 1.4 x 0.9 cm when measured similarly. This is highly concerning for a slowly enlarging primary lung malignancy. Recommend multidisciplinary thoracic referral for consideration of metabolic characterization by PET-CT and tissue  sampling. 2. There is a new nodule of the anterior left upper lobe measuring 1.0 cm, nonspecific although worrisome for satellite nodule or metastasis. 3. Unchanged enlargement of a left hilar lymph node, concerning for nodal metastasis. 4. Unchanged consolidation and nodularity of the infrahilar left lower lobe as well as of the perihilar right middle lobe, these findings generally nonspecific and infectious or inflammatory, appearance particularly suggesting atypical mycobacterial infection.  5. Redemonstrated enlarged right axillary and retroperitoneal lymph nodes, stable to slightly diminished in size. These are in keeping with reported diagnosis of CLL. 6. Slightly improved splenomegaly, maximum coronal span 18.0 cm.  7. Emphysema. 8. Coronary artery disease.   Aortic Atherosclerosis (ICD10-I70.0) and Emphysema (ICD10-J43.9).   Non-small cell carcinoma of lung, left ([place])  09/18/2020 Initial Diagnosis   Non-small cell carcinoma of lung, left ([place])   09/20/2020 Cancer Staging   Staging form: Lung, AJCC 8th Edition - Clinical stage from 09/20/2020: Stage IVA (cT3, cN1, cM1a) - Signed by Ryan Lark, MD on 09/20/2020 Stage prefix: Initial diagnosis    09/30/2020 Imaging   IMPRESSION MRI brain No evidence of metastatic disease in the brain.   10/05/2020 PET scan   Hypermetabolic 1 cm nodule in the LEFT upper lobe suspicious for pulmonary neoplasm. Referral to multi disciplinary thoracic oncologic setting is suggested if not yet performed. Biopsy may be warranted.   Juxta hilar nodule that shows enlargement over time and associated LEFT hilar lymph node without pronounced FDG uptake but given enlargement and uptake above blood pool raising the question of indolent neoplasm in this location.   Stable small pulmonary nodule in the RIGHT middle lobe and stable RIGHT upper lobe subsolid nodule, attention on follow-up.   RIGHT axillary and retroperitoneal nodal enlargement with mildly elevated FDG  uptake near mediastinal blood pool with respect to retroperitoneal lymph nodes, likely related to the patient's known CLL. Splenic size remains smaller than in 2022. Close attention on follow-up is suggested.   Aortic Atherosclerosis (ICD10-I70.0).   Emphysema (ICD10-J43.9).   12/08/2020 Pathology Results   FINAL MICROSCOPIC DIAGNOSIS:   A. LUNG, LUL, BRUSHING:  - Malignant cells consistent with non-small cell carcinoma, see comment   B. LUNG, LUL, NEEDLE  BIOPSIES:  - Malignant cells consistent with non-small cell carcinoma     PHYSICAL EXAMINATION: ECOG PERFORMANCE STATUS: 1 - Symptomatic but completely ambulatory  Vitals:   12/14/20 1335  BP: 129/61  Pulse: 65  Resp: 17  Temp: (!) 96.8 F (36 C)  SpO2: 100%   Filed Weights   12/14/20 1335  Weight: 172  lb 9.6 oz (78.3 kg)    GENERAL:alert, no distress and comfortable NEURO: alert & oriented x 3 with fluent speech, no focal motor/sensory deficits  LABORATORY DATA:  I have reviewed the data as listed    Component Value Date/Time   NA 138 12/04/2020 0828   NA 142 06/19/2019 0848   NA 136 01/26/2017 0940   K 4.1 12/04/2020 0828   K 4.0 01/26/2017 0940   CL 107 12/04/2020 0828   CL 104 08/03/2012 1229   CO2 26 12/04/2020 0828   CO2 23 01/26/2017 0940   GLUCOSE 110 (H) 12/04/2020 0828   GLUCOSE 146 (H) 01/26/2017 0940   GLUCOSE 223 (H) 08/03/2012 1229   BUN 13 12/04/2020 0828   BUN 13 06/19/2019 0848   BUN 9.6 01/26/2017 0940   CREATININE 0.71 12/04/2020 0828   CREATININE 1.0 01/26/2017 0940   CALCIUM 8.6 (L) 12/04/2020 0828   CALCIUM 8.7 01/26/2017 0940   PROT 6.3 (L) 12/04/2020 0828   PROT 5.9 (L) 04/13/2017 0823   PROT 5.7 (L) 01/26/2017 0940   ALBUMIN 3.8 12/04/2020 0828   ALBUMIN 3.6 01/26/2017 0940   AST 29 12/04/2020 0828   AST 13 01/26/2017 0940   ALT 30 12/04/2020 0828   ALT 12 01/26/2017 0940   ALKPHOS 102 12/04/2020 0828   ALKPHOS 78 01/26/2017 0940   BILITOT 0.4 12/04/2020 0828   BILITOT  0.64 01/26/2017 0940   GFRNONAA >60 12/04/2020 0828   GFRAA >60 10/25/2019 0819   GFRAA >60 04/24/2018 0821    No results found for: SPEP, UPEP  Lab Results  Component Value Date   WBC 9.8 12/04/2020   NEUTROABS 2.5 12/04/2020   HGB 11.6 (L) 12/04/2020   HCT 37.3 (L) 12/04/2020   MCV 91.9 12/04/2020   PLT 84 (L) 12/04/2020      Chemistry      Component Value Date/Time   NA 138 12/04/2020 0828   NA 142 06/19/2019 0848   NA 136 01/26/2017 0940   K 4.1 12/04/2020 0828   K 4.0 01/26/2017 0940   CL 107 12/04/2020 0828   CL 104 08/03/2012 1229   CO2 26 12/04/2020 0828   CO2 23 01/26/2017 0940   BUN 13 12/04/2020 0828   BUN 13 06/19/2019 0848   BUN 9.6 01/26/2017 0940   CREATININE 0.71 12/04/2020 0828   CREATININE 1.0 01/26/2017 0940      Component Value Date/Time   CALCIUM 8.6 (L) 12/04/2020 0828   CALCIUM 8.7 01/26/2017 0940   ALKPHOS 102 12/04/2020 0828   ALKPHOS 78 01/26/2017 0940   AST 29 12/04/2020 0828   AST 13 01/26/2017 0940   ALT 30 12/04/2020 0828   ALT 12 01/26/2017 0940   BILITOT 0.4 12/04/2020 0828   BILITOT 0.64 01/26/2017 0940       RADIOGRAPHIC STUDIES: I have personally reviewed the radiological images as listed and agreed with the findings in the report. DG CHEST PORT 1 VIEW  Result Date: 12/08/2020 CLINICAL DATA:  Status post bronchoscopy with biopsy. EXAM: PORTABLE CHEST 1 VIEW COMPARISON:  Chest x-ray 11/02/2020.  CT chest 12/01/2020. FINDINGS: Right-sided chest port catheter tip projects over the mid SVC, unchanged. The cardiomediastinal silhouette is within normal limits. Focal nodular density in the left mid lung is again seen. There is a new radiopaque marker at this level. There is also a new radiopaque marker projecting over the right mid lung. There is some strandy opacities in the lung bases favored is atelectasis. There is no  pleural effusion or pneumothorax identified. No acute fractures identified. IMPRESSION: 1. No acute abnormality.  2. Stable nodular density in the left mid lung with new bilateral radiopaque markers. Electronically Signed   By: Ronney Asters M.D.   On: 12/08/2020 15:06   CT Super D Chest Wo Contrast  Result Date: 12/04/2020 CLINICAL DATA:  Follow-up pneumonia.  Lung nodule. EXAM: CT CHEST WITHOUT CONTRAST TECHNIQUE: Multidetector CT imaging of the chest was performed using thin slice collimation for electromagnetic bronchoscopy planning purposes, without intravenous contrast. COMPARISON:  CT 11/02/2020 on PET-CT 10/02/2020, CT 09/16/2020 FINDINGS: Cardiovascular: Port in the anterior chest wall with tip in distal SVC. Coronary artery calcification and aortic atherosclerotic calcification. Mediastinum/Nodes: No axillary or supraclavicular adenopathy. No mediastinal or hilar adenopathy. No pericardial fluid. Esophagus normal. Lungs/Pleura: Lobular nodule in the LEFT upper lobe measures 12 mm (image 74/7) compared to 10 mm on CT 09/16/2020. Interval near complete resolution of LEFT lower lobe consolidation with only residual medial mild consolidation remaining measuring 2.5 cm (115/7). Complete resolution of the RIGHT lower lobe consolidation. Resolution of the pleural effusions. Within the RIGHT middle lobe 5 mm nodule (image 108/7) is unchanged. Upper Abdomen: Limited view of the liver, kidneys, pancreas are unremarkable. Normal adrenal glands. Musculoskeletal: No aggressive osseous lesion. IMPRESSION: 1. Persistent LEFT upper lobe pulmonary nodule with suspicious morphology. Lesion was hypermetabolic on comparison PET-CT scan. Nodule concerning for bronchogenic carcinoma. 2. Near complete resolution of LEFT lower lobe consolidation and complete resolution of RIGHT lower lobe consolidation. 3. Stable small RIGHT middle lobe pulmonary nodule. Electronically Signed   By: Suzy Bouchard M.D.   On: 12/04/2020 07:58   DG C-ARM BRONCHOSCOPY  Result Date: 12/08/2020 C-ARM BRONCHOSCOPY: Fluoroscopy was utilized by the  requesting physician.  No radiographic interpretation.

## 2020-12-14 NOTE — Progress Notes (Signed)
History of Present Illness Ryan Copeland is a 70 y.o. male former smoker ( Quit 2018 with a 44 pack year smoking history)  with Past medical history significant for COPD mixed type, chronic sinusitis, community-acquired bacterial pneumonia, hypertension, MI, chronic diastolic heart failure, GERD, chronic inflammatory demyelinating polyneuropathy, diabetes mellitus, small cell B-cell lymphoma.    Follows with oncology for small cell B-cell lymphoma.and abnormal chest imaging. He is followed by Dr. Shearon Stalls for COPD, and Dr. Valeta Harms for tissue sampling of left upper lobe pulmonary nodule.   12/14/2020 Pt. Presents for follow up. Patient had bronchoscopy for abnormal persistent LEFT upper lobe pulmonary nodule with suspicious morphology 12/08/2020. Marland Kitchen Lesion was hypermetabolic on comparison PET-CT scan. Nodule concerning for bronchogenic carcinoma. 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 He understands that a fiducial was placed for future treatment. He is doing well after his procedure. He states he has a sore throat , and dry mouth since his bronch.  No fever, but terrible dry mouth. We discussed options of sugar free hard candy for throat soothing, and Biotin mouthwash for dry mouth.  He is currently being treated for CLL. He already as a port a cath, and is followed by Dr. Alvy Bimler for this diagnosis..     Procedure: 12/08/2020 Video Bronchoscopy with Robotic Assisted Bronchoscopic Navigation Following tissue sampling of the LUL,  a single fiducial was placed with the fiducial catheter wire delivery kit  Test results Cytology  FINAL MICROSCOPIC DIAGNOSIS:   C. LUNG, RUL, BRUSHING:  - No malignant cells identified   D. LUNG, RUL, NEEDLE  BIOPSIES:  - No malignant cells  identified    FINAL MICROSCOPIC DIAGNOSIS:   A. LUNG, LUL, BRUSHING:  - Malignant cells consistent with non-small cell carcinoma, see comment   B. LUNG, LUL, NEEDLE  BIOPSIES:  - Malignant cells consistent with non-small cell carcinoma    CBC Latest Ref Rng & Units 12/04/2020 11/06/2020 11/06/2020  WBC 4.0 - 10.5 K/uL 9.8 1.9(L) 1.9(L)  Hemoglobin 13.0 - 17.0 g/dL 11.6(L) 10.0(L) 10.0(L)  Hematocrit 39.0 - 52.0 % 37.3(L) 30.9(L) 30.9(L)  Platelets 150 - 400 K/uL 84(L) 35(L) 36(L)    BMP Latest Ref Rng & Units 12/04/2020 11/06/2020 11/05/2020  Glucose 70 - 99 mg/dL 110(H) 112(H) 216(H)  BUN 8 - 23 mg/dL 13 11 15   Creatinine 0.61 - 1.24 mg/dL 0.71 0.69 0.81  BUN/Creat Ratio 10 - 24 - - -  Sodium 135 - 145 mmol/L 138 138 137  Potassium 3.5 - 5.1 mmol/L 4.1 3.7 3.7  Chloride 98 - 111 mmol/L 107 107 108  CO2 22 - 32 mmol/L 26 24 24   Calcium 8.9 - 10.3 mg/dL 8.6(L) 8.8(L) 8.4(L)    BNP    Component Value Date/Time   BNP 146.4 (H) 02/15/2020 0503    ProBNP    Component Value Date/Time   PROBNP 59.0 06/28/2009 0920    PFT    Component Value Date/Time   FEV1PRE 2.72 06/27/2019 0855   FEV1POST 2.82 06/27/2019 0855   FVCPRE 3.62 06/27/2019 0855   FVCPOST 3.70 06/27/2019 0855   PREFEV1FVCRT 75 06/27/2019 0855   PSTFEV1FVCRT 76 06/27/2019 0855    DG CHEST PORT 1 VIEW  Result Date: 12/08/2020 CLINICAL DATA:  Status post bronchoscopy with biopsy. EXAM: PORTABLE CHEST  1 VIEW COMPARISON:  Chest x-ray 11/02/2020.  CT chest 12/01/2020. FINDINGS: Right-sided chest port catheter tip projects over the mid SVC, unchanged. The cardiomediastinal silhouette is within normal limits. Focal nodular density in the left mid lung is again seen. There is a new radiopaque marker at this level. There is also a new radiopaque marker projecting over the right mid lung. There is some strandy opacities in the lung bases favored is atelectasis. There is no pleural effusion or pneumothorax identified. No  acute fractures identified. IMPRESSION: 1. No acute abnormality. 2. Stable nodular density in the left mid lung with new bilateral radiopaque markers. Electronically Signed   By: Ronney Asters M.D.   On: 12/08/2020 15:06   CT Super D Chest Wo Contrast  Result Date: 12/04/2020 CLINICAL DATA:  Follow-up pneumonia.  Lung nodule. EXAM: CT CHEST WITHOUT CONTRAST TECHNIQUE: Multidetector CT imaging of the chest was performed using thin slice collimation for electromagnetic bronchoscopy planning purposes, without intravenous contrast. COMPARISON:  CT 11/02/2020 on PET-CT 10/02/2020, CT 09/16/2020 FINDINGS: Cardiovascular: Port in the anterior chest wall with tip in distal SVC. Coronary artery calcification and aortic atherosclerotic calcification. Mediastinum/Nodes: No axillary or supraclavicular adenopathy. No mediastinal or hilar adenopathy. No pericardial fluid. Esophagus normal. Lungs/Pleura: Lobular nodule in the LEFT upper lobe measures 12 mm (image 74/7) compared to 10 mm on CT 09/16/2020. Interval near complete resolution of LEFT lower lobe consolidation with only residual medial mild consolidation remaining measuring 2.5 cm (115/7). Complete resolution of the RIGHT lower lobe consolidation. Resolution of the pleural effusions. Within the RIGHT middle lobe 5 mm nodule (image 108/7) is unchanged. Upper Abdomen: Limited view of the liver, kidneys, pancreas are unremarkable. Normal adrenal glands. Musculoskeletal: No aggressive osseous lesion. IMPRESSION: 1. Persistent LEFT upper lobe pulmonary nodule with suspicious morphology. Lesion was hypermetabolic on comparison PET-CT scan. Nodule concerning for bronchogenic carcinoma. 2. Near complete resolution of LEFT lower lobe consolidation and complete resolution of RIGHT lower lobe consolidation. 3. Stable small RIGHT middle lobe pulmonary nodule. Electronically Signed   By: Suzy Bouchard M.D.   On: 12/04/2020 07:58   DG C-ARM BRONCHOSCOPY  Result Date:  12/08/2020 C-ARM BRONCHOSCOPY: Fluoroscopy was utilized by the requesting physician.  No radiographic interpretation.     Past medical hx Past Medical History:  Diagnosis Date   Anxiety 01/30/2014   Back injury    lower disc   CAD (coronary artery disease)    CLL (chronic lymphocytic leukemia) ([place]) 03/18/2011   COPD (chronic obstructive pulmonary disease) ([place])    DM type 2 (diabetes mellitus, type 2) ([facility])    ECRB (extensor carpi radialis brevis) tenosynovitis    Foley catheter in place 11/2019   last changed 04-14-2020   GERD (gastroesophageal reflux disease)    takes Nexium if needed   History of blood transfusion 2015   Hyperlipidemia    Insomnia    Long-term current use of intravenous immunoglobulin (IVIG)    q month ivig   MI, acute, non ST segment elevation ([place]) 06/28/2009   with stenting of the LAD   Neuromuscular disorder ([place])    peripheral neuropathy both all toes   Pneumonia 11/2019   10/2020   PVD (peripheral vascular disease) ([facility])    Thrombocytopenia ([facility])    Tobacco abuse      Social History   Tobacco Use   Smoking status: Former    Packs/day: 1.00    Years: 44.00    Pack years: 44.00    Types: Cigarettes  Quit date: 04/23/2016    Years since quitting: 4.6   Smokeless tobacco: Never  Vaping Use   Vaping Use: Never used  Substance Use Topics   Alcohol use: Not Currently    Alcohol/week: 0.0 standard drinks   Drug use: No    Mr.Okazaki reports that he quit smoking about 4 years ago. His smoking use included cigarettes. He has a 44.00 pack-year smoking history. He has never used smokeless tobacco. He reports that he does not currently use alcohol. He reports that he does not use drugs.  Tobacco Cessation: Former smoker, Quit 2018 with a 44 pack year smoking history   Past surgical hx, Family hx, Social hx all reviewed.  Current Outpatient Medications on File Prior to Visit  Medication Sig   Acalabrutinib Maleate (CALQUENCE) 100 MG TABS Take  1 tablet (100 mg) by mouth 2 (two) times daily.   acetaminophen (TYLENOL) 325 MG tablet Take 2 tablets (650 mg total) by mouth every 6 (six) hours as needed for moderate pain or fever.   acyclovir (ZOVIRAX) 400 MG tablet TAKE 1 TABLET BY MOUTH TWICE A DAY (Patient taking differently: Take 400 mg by mouth 2 (two) times daily.)   allopurinol (ZYLOPRIM) 300 MG tablet Take 1 tablet (300 mg total) by mouth daily.   ALPRAZolam (XANAX) 0.5 MG tablet Take 0.5 mg by mouth at bedtime as needed for anxiety.   b complex vitamins tablet Take 1 tablet by mouth daily.    benzonatate (TESSALON) 100 MG capsule TAKE TWO CAPSULES BY MOUTH THREE TIMES A DAY AS NEEDED   Cholecalciferol 25 MCG (1000 UT) tablet Take 1,000 Units by mouth daily.    diphenoxylate-atropine (LOMOTIL) 2.5-0.025 MG tablet TAKE 1 TABLET BY MOUTH 4 (FOUR) TIMES DAILY AS NEEDED FOR DIARRHEA OR LOOSE STOOLS.   famotidine (PEPCID) 20 MG tablet TAKE 1 TABLET BY MOUTH TWICE A DAY (Patient taking differently: Take 20 mg by mouth 2 (two) times daily as needed for heartburn.)   furosemide (LASIX) 20 MG tablet Take 20 mg by mouth daily.   HYDROcodone-acetaminophen (NORCO) 10-325 MG tablet Take 1 tablet by mouth every 6 (six) hours as needed for moderate pain.   lidocaine-prilocaine (EMLA) cream Apply 1 application topically as needed (prior chemo/IBIG).   loperamide (IMODIUM) 2 MG capsule Take 2 capsules (4 mg total) by mouth 4 (four) times daily. (Patient taking differently: Take 4 mg by mouth 4 (four) times daily as needed for diarrhea or loose stools.)   meclizine (ANTIVERT) 12.5 MG tablet Take 12.5 mg by mouth 3 (three) times daily as needed for dizziness.   metFORMIN (GLUCOPHAGE-XR) 500 MG 24 hr tablet Take 500 mg by mouth every evening.   midodrine (PROAMATINE) 5 MG tablet Take 3 tablets (15 mg total) by mouth 3 (three) times daily with meals.   montelukast (SINGULAIR) 10 MG tablet TAKE 1 TABLET BY MOUTH EVERY DAY (Patient taking differently: Take 10  mg by mouth at bedtime.)   nitroGLYCERIN (NITROSTAT) 0.4 MG SL tablet PLACE 1 TABLET UNDER THE TONGUE EVERY 5 MINUTES X 3 DOSES AS NEEDED FOR CHEST PAIN *MAX 3 DOSES*   oxybutynin (DITROPAN) 5 MG tablet Take 5 mg by mouth 2 (two) times daily as needed.   pregabalin (LYRICA) 200 MG capsule TAKE 1 CAPSULE (200 MG TOTAL) BY MOUTH 3 (THREE) TIMES DAILY.   rosuvastatin (CRESTOR) 5 MG tablet Take 1 tablet (5 mg total) by mouth daily.   sildenafil (VIAGRA) 100 MG tablet Take 0.5 tablets (50 mg total) by  mouth as needed for erectile dysfunction (30 mintues prior to sexual intercourse).   tamsulosin (FLOMAX) 0.4 MG CAPS capsule Take 1 capsule (0.4 mg total) by mouth daily.   zolpidem (AMBIEN) 10 MG tablet Take 10 mg by mouth at bedtime as needed for sleep.   Current Facility-Administered Medications on File Prior to Visit  Medication   0.9 %  sodium chloride infusion   sodium chloride 0.9 % injection 10 mL     Allergies  Allergen Reactions   Immune Globulin Hives and Rash    Pt reports hives/rash on body with IV Privigen. He was changed to IV Gamunex-C and had many infusions without adverse side effects.    Codeine Hives    Pt states he can take a few, more reaction with extended doses.    Review Of Systems:  Constitutional:   No  weight loss, night sweats,  Fevers, chills, fatigue, or  lassitude.  HEENT:   No headaches,  Difficulty swallowing,  Tooth/dental problems, or  Sore throat,                No sneezing, itching, ear ache, nasal congestion, post nasal drip,   CV:  No chest pain,  Orthopnea, PND, swelling in lower extremities, anasarca, dizziness, palpitations, syncope.   GI  No heartburn, indigestion, abdominal pain, nausea, vomiting, diarrhea, change in bowel habits, loss of appetite, bloody stools.   Resp: + baseline  shortness of breath with exertion or at rest.  No excess mucus, no productive cough,  No non-productive cough,  No coughing up of blood.  No change in color of  mucus.  No wheezing.  No chest wall deformity  Skin: no rash or lesions.  GU: no dysuria, change in color of urine, no urgency or frequency.  No flank pain, no hematuria   MS:  No joint pain or swelling.  No decreased range of motion.  No back pain.  Psych:  No change in mood or affect. No depression or anxiety.  No memory loss.   Vital Signs BP 122/78   Pulse 68   Temp 98.5 F (36.9 C)   Ht 6' (1.829 m)   Wt 172 lb 6.4 oz (78.2 kg)   SpO2 97%   BMI 23.38 kg/m    Physical Exam:  General- No distress,  A&Ox3, pleasant, tired appearing ENT: No sinus tenderness, TM clear, pale nasal mucosa, no oral exudate,no post nasal drip, no LAN Cardiac: S1, S2, regular rate and rhythm, no murmur Chest: No wheeze/ rales/ dullness; no accessory muscle use, no nasal flaring, no sternal retractions, few rhonchi noted throughout Abd.: Soft Non-tender, ND, BS +, Body mass index is 23.38 kg/m. Ext: No clubbing cyanosis, edema Neuro:  physical deconditioning, Alert and oriented x 3, MAE x 4 Skin: No rashes, warm and dry, , thin skin, bruising  Psych: normal mood and behavior, appropriately concerned about diagnosis   Assessment/Plan  LUL  of lung Adenocarcinoma Plan We will refer to radiation oncology for further treatment Call the office in 2-3 days if you have not been called to schedule appointment   Dry Mouth and Sore throat post Bronch Plan We can recommend Chloraseptic for your sore throat, you can also try tylenol and ibuprofen. We can recommend Biotin mouthwash for the dry mouth.  Use as directed.    COPD Plan  Currently monitoring off inhaler use  Note your daily symptoms > remember "red flags" for COPD:  Increase in cough, increase in sputum production, increase in  shortness of breath or activity intolerance. If you notice these symptoms, please call to be seen.     I spent 35 minutes dedicated to the care of this patient on the date of this encounter to include pre-visit  review of records, face-to-face time with the patient discussing conditions above, post visit ordering of testing, clinical documentation with the electronic health record, making appropriate referrals as documented, and communicating necessary information to the patient's healthcare team.    Magdalen Spatz, NP 12/14/2020  7:12 PM

## 2020-12-14 NOTE — Patient Instructions (Addendum)
It is good to see you today. We can recommend Chloraseptic for your sore throat, you can also try tylenol and ibuprofen. We can recommend Biotin mouthwash for the dry mouth.  Use as directed.  We have referred you to radiation oncology.  You should get a call to get this scheduled . Call us if you do not hear from them in 2-3 days, and we will follow up.  Please contact office for sooner follow up if symptoms do not improve or worsen or seek emergency care

## 2020-12-14 NOTE — Assessment & Plan Note (Signed)
He has good response with Calquence He will continue the same

## 2020-12-14 NOTE — Assessment & Plan Note (Signed)
We discussed the risk and benefits of IVIG treatment and he is in agreement to proceed

## 2020-12-14 NOTE — Assessment & Plan Note (Signed)
I gave the patient a copy of the pathology report I spoke with the pathologist myself, he favor adenocarcinoma but due to scant tissue available, he is not able to order additional immunostains The patient is not a candidate for surgery The pulmonologist team has referred him to see radiation oncologist for discussion about the role of definitive radiation treatment Unfortunately, due to his profound immunocompromise state and chronic pancytopenia, he is not a candidate for concurrent systemic chemotherapy either I favor ordering guardant 360/next generation sequencing test to look for actionable mutations For now, I recommend he continues his treatment for CLL At the completion of radiation treatment, we will repeat PET/CT imaging to look for residual disease I acknowledge the difficulties of interpreting his imaging study due to concurrent lymphadenopathy from CLL He is in agreement with the plan of care

## 2020-12-15 ENCOUNTER — Ambulatory Visit: Payer: Medicare Other | Attending: Hematology and Oncology | Admitting: Rehabilitation

## 2020-12-15 ENCOUNTER — Encounter: Payer: Self-pay | Admitting: Rehabilitation

## 2020-12-15 ENCOUNTER — Other Ambulatory Visit: Payer: Medicare Other

## 2020-12-15 ENCOUNTER — Telehealth: Payer: Self-pay

## 2020-12-15 DIAGNOSIS — C3492 Malignant neoplasm of unspecified part of left bronchus or lung: Secondary | ICD-10-CM | POA: Diagnosis present

## 2020-12-15 DIAGNOSIS — R262 Difficulty in walking, not elsewhere classified: Secondary | ICD-10-CM | POA: Insufficient documentation

## 2020-12-15 DIAGNOSIS — M6281 Muscle weakness (generalized): Secondary | ICD-10-CM | POA: Diagnosis present

## 2020-12-15 DIAGNOSIS — R2689 Other abnormalities of gait and mobility: Secondary | ICD-10-CM | POA: Insufficient documentation

## 2020-12-15 NOTE — Telephone Encounter (Signed)
Called and scheduled lab appt for 1200 on 11/3 to collect Guardant 360. He is aware of appt date/time.

## 2020-12-15 NOTE — Therapy (Signed)
[facility] @ [address] [place], [place], [address] Phone: [phone]   Fax:  [phone]  Physical Therapy Treatment  Patient Details  Name: Ryan Copeland MRN: [id number] Date of Birth: 29-Jun-1950 Referring Provider (PT): Alvy Bimler   Encounter Date: 12/15/2020   PT End of Session - 12/15/20 1451     Visit Number 8    Number of Visits 16    Date for PT Re-Evaluation 02/17/21    PT Start Time 1400    PT Stop Time 1025    PT Time Calculation (min) 49 min    Activity Tolerance Patient tolerated treatment well    Behavior During Therapy [facility] for tasks assessed/performed             Past Medical History:  Diagnosis Date   Anxiety 01/30/2014   Back injury    lower disc   CAD (coronary artery disease)    CLL (chronic lymphocytic leukemia) ([place]) 03/18/2011   COPD (chronic obstructive pulmonary disease) ([place])    DM type 2 (diabetes mellitus, type 2) ([place])    ECRB (extensor carpi radialis brevis) tenosynovitis    Foley catheter in place 11/2019   last changed 04-14-2020   GERD (gastroesophageal reflux disease)    takes Nexium if needed   History of blood transfusion 2015   Hyperlipidemia    Insomnia    Long-term current use of intravenous immunoglobulin (IVIG)    q month ivig   MI, acute, non ST segment elevation ([place]) 06/28/2009   with stenting of the LAD   Neuromuscular disorder ([place])    peripheral neuropathy both all toes   Pneumonia 11/2019   10/2020   PVD (peripheral vascular disease) ([place])    Thrombocytopenia ([place])    Tobacco abuse     Past Surgical History:  Procedure Laterality Date   ADENOIDECTOMY  1955   BRONCHIAL BIOPSY  12/08/2020   Procedure: BRONCHIAL BIOPSIES;  Surgeon: [name], DO;  Location: [facility];  Service: Pulmonary;;   BRONCHIAL BRUSHINGS  12/08/2020   Procedure: BRONCHIAL BRUSHINGS;  Surgeon: [name], DO;  Location: [facility];  Service: Pulmonary;;   BRONCHIAL  NEEDLE ASPIRATION BIOPSY  12/08/2020   Procedure: BRONCHIAL NEEDLE ASPIRATION BIOPSIES;  Surgeon: [name], DO;  Location: [facility];  Service: Pulmonary;;   CARDIAC CATHETERIZATION     CARDIAC CATHETERIZATION N/A 09/26/2014   Procedure: Left Heart Cath and Coronary Angiography;  Surgeon: Peter M Martinique, MD;  Location: [facility];  Service: Cardiovascular;  Laterality: N/A;   carpel tunnel release Left 04-1989   carpel tunnel release  Right 01-1989   CHOLECYSTECTOMY  2007   [place]   CORONARY STENT PLACEMENT  06/2009   stent to lad   CYSTOSCOPY N/A 04/20/2020   Procedure: [place];  Surgeon: Janith Lima, MD;  Location: [facility];  Service: Urology;  Laterality: N/A;  ONLY NEEDS 30 MIN   femoral stents  09/2014   iliac stent   FIDUCIAL MARKER PLACEMENT  12/08/2020   Procedure: FIDUCIAL MARKER PLACEMENT;  Surgeon: [name], DO;  Location: [facility];  Service: Pulmonary;;   IR CV LINE INJECTION  08/18/2017   IR CV LINE INJECTION  09/01/2017   IR CV LINE INJECTION  02/02/2018   LATERAL EPICONDYLE RELEASE Left 02/12/2014   Procedure: LEFT ELBOW DEBRIDEMENT WITH TENDON REPAIR ;  Surgeon: Lorn Junes, MD;  Location: [facility];  Service: Orthopedics;  Laterality: Left;   LEFT CAI STENT/PTA AND POPLITEAL ARTERY/TIBIAL THROMBECTOMY      LEFT HEART CATHETERIZATION WITH CORONARY ANGIOGRAM N/A 08/26/2011   Procedure: LEFT HEART CATHETERIZATION WITH CORONARY ANGIOGRAM;  Surgeon: Peter M Martinique, MD;  Location: [facility];  Service: Cardiovascular;  Laterality: N/A;   PERIPHERAL VASCULAR CATHETERIZATION N/A 01/01/2015   Procedure: Abdominal Aortogram;  Surgeon: Conrad Rockcastle, MD;  Location: [facility];  Service: Cardiovascular;  Laterality: N/A;   port a cath insertion  2014   right    TARSAL TUNNEL RELEASE Bilateral 08-2007   VIDEO BRONCHOSCOPY WITH ENDOBRONCHIAL NAVIGATION Left 12/08/2020   Procedure: VIDEO BRONCHOSCOPY  WITH ENDOBRONCHIAL NAVIGATION;  Surgeon: [name], DO;  Location: [facility];  Service: Pulmonary;  Laterality: Left;  ION w/ fiducial placement   VIDEO BRONCHOSCOPY WITH RADIAL ENDOBRONCHIAL ULTRASOUND  12/08/2020   Procedure: RADIAL ENDOBRONCHIAL ULTRASOUND;  Surgeon: [name], DO;  Location: [facility];  Service: Pulmonary;;    There were no vitals filed for this visit.   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  The plan is radiation.  It helps me some to come in.  It has given me a little endurance. 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 No/denies                [facility] PT Assessment - 12/15/20 0001       Strength   Right Hip Flexion 4+/5    Right Hip Extension 4-/5    Right Hip ABduction 4/5    Left Hip Flexion 4+/5    Left Hip Extension 4-/5    Left Hip ABduction 3-/5    Right Knee Flexion 4/5    Right Knee Extension 4/5    Left Knee Flexion 4/5    Left Knee Extension 4/5                           [facility] Adult PT Treatment/Exercise - 12/15/20 0001       High Level Balance   High Level Balance Comments in parallel bars with HHA PRN: tandem walking and backwards walking x 2 lengths      Exercises   Exercises Other Exercises    Other Exercises  wall push ups x 10      Knee/Hip Exercises: Aerobic   Nustep Level 5 UE/LE,  arms at 12, x11 mins with therapist monitoring pt throughout for fatigue.  572 steps      Knee/Hip Exercises: Standing   Hip Flexion Both;15 reps    Hip Flexion Limitations 3#    Hip Abduction Both;15 reps     Abduction Limitations 3#    Hip Extension Both;15 reps    Extension Limitations 3#    Wall Squat 10 reps    Wall Squat Limitations with ball                          PT Long Term Goals - 12/15/20 1413  PT LONG TERM GOAL #1   Title Pt will be able to sit to stand from a chair 8 times in 30 seconds without use of UEs to decrease fall risk.    Baseline able to do sit to stand without hands x 1    Status On-going      PT LONG TERM GOAL #2   Title Pt will demonstrate 4/5 bilateral hip extensor strength to help decrease fall risk    Baseline 12/15/20: Rt: and Lt: 4-/5    Status Partially Met      PT LONG TERM GOAL #3   Title Pt will demonstrate 5/5 bilateral hip flexor strength to decrease fall risk    Baseline 12/15/20: 4+/5 bil    Status Partially Met      PT LONG TERM GOAL #4   Title Pt will demonstrate 5/5 bilateral shoulder flexion strength to allow pt to return to PLOF where he was able to lift 50 lb bags of gravel.    Baseline still unable to lift the bags of gravel    Status On-going      PT LONG TERM GOAL #5   Title Pt will be able to get in his truck without increased difficulty.    Status Achieved      PT LONG TERM GOAL #6   Title Pt will be able to ascend 1 flight of steps with step through gait pattern and only 1 hand hold assist to decrease fall risk    Baseline able to do clinic stairs step through pattern    Status Achieved      PT LONG TERM GOAL #7   Title Pt will be independent in a home exercise program for long term stretching and strengthening    Status On-going                   Plan - 12/15/20 1453     Clinical Impression Statement Pt doing better today but had news about a recurrence of Lt upper lobe NSCC after his biopsy.  The plan will be radiation.  Pt has made excellent progress in terms of his LE strength and balance but continues to have trouble getting up tall steps and lifting the 50# bags of gravel at home.  See  goal section.  Extended POC to continue visits for the next 8 weeks as needed.    PT Frequency 2x / week    PT Duration 8 weeks    PT Treatment/Interventions ADLs/Self Care Home Management;Therapeutic activities;Therapeutic exercise;Gait training;Stair training;Functional mobility training;Balance training;Neuromuscular re-education;Patient/family education;Manual techniques    PT Next Visit Plan Cont LE strengthening , stair ambulation/step ups, high level balance,    PT Home Exercise Plan bil LE and UE strength and balance activities; bil ankle 4 way with yellow theraband    Consulted and Agree with Plan of Care Patient             Patient will benefit from skilled therapeutic intervention in order to improve the following deficits and impairments:     Visit Diagnosis: Other abnormalities of gait and mobility  Muscle weakness (generalized)  Difficulty in walking, not elsewhere classified  Non-small cell carcinoma of left lung [facility])     Problem List Patient Active Problem List   Diagnosis Date Noted   Bilateral leg edema 10/27/2020   Physical debility 10/27/2020   Lung nodule 09/23/2020   Non-small cell carcinoma of lung, left ([place]) 09/18/2020   Head and neck lymphadenopathy 08/21/2020  Complicated UTI (urinary tract infection) 07/18/2020   Gum lesion 05/29/2020   Acute lower UTI 02/16/2020   Bladder pain 02/16/2020   Chronic hypotension 02/16/2020   Vitamin B12 deficiency 01/01/2020   Hypotension due to drugs 01/01/2020   Protein-calorie malnutrition, moderate ([place]) 01/01/2020   Septic shock ([place]) 12/15/2019   Community acquired pneumonia 11/28/2019   Cerebral vascular disease 11/25/2019   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/2019   Other fatigue 10/25/2019   Deficiency anemia 10/25/2019   Dizziness 10/22/2019   Gait abnormality 10/22/2019   Hx of nonmelanoma skin cancer 03/15/2019   Essential hypertension 04/24/2018   Influenza  A 04/17/2018   Sepsis ([place]) 04/17/2018   Chronic diastolic CHF (congestive heart failure) ([place]) 04/17/2018   Skin rash 08/07/2017   Port-A-Cath in place 08/04/2017   Chronic apical periodontitis 04/26/2017   Retained dental roots 04/26/2017   Dental caries 04/26/2017   Chronic periodontitis 04/26/2017   Loose, teeth 04/26/2017   Peripheral polyneuropathy 04/14/2017   COPD mixed type ([place]) 02/23/2017   Chronic sinusitis 01/27/2017   Splenomegaly, congestive, chronic 12/29/2016   Diarrhea 12/01/2016   Poor dentition 12/01/2016   Benign mole 12/01/2016   Pancytopenia, acquired ([place]) 11/03/2016   Odynophagia 01/15/2016   Polycythemia, secondary 06/26/2015   Quality of life palliative care encounter 06/26/2015   Atherosclerosis of extremity with intermittent claudication ([place]) 01/23/2015   Lateral epicondylitis of left elbow    ECRB (extensor carpi radialis brevis) tenosynovitis    Anxiety 01/30/2014   Preoperative clearance 01/30/2014   Thrombocytopenia (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

## 2020-12-16 NOTE — Progress Notes (Signed)
Thoracic Location of Tumor / Histology: Left Upper Lobe Lung- Non small cell carcinoma  Patient presented for follow-up scans for left upper lobe lung nodule.  PET 9/89/2119:  Hypermetabolic 1 cm nodule in the LEFT upper lobe suspicious for pulmonary neoplasm.  Juxta hilar nodule that shows enlargement over time and associated LEFT hilar lymph node without pronounced FDG uptake but given enlargement and uptake above blood pool raising the question of indolent neoplasm in this location.  Stable small pulmonary nodule in the RIGHT middle lobe and stable RIGHT upper lobe subsolid nodule, attention on follow-up.  RIGHT axillary and retroperitoneal nodal enlargement with mildly elevated FDG uptake near mediastinal blood pool with respect to retroperitoneal lymph nodes, likely related to the patient's known CLL. Splenic size remains smaller than in 2022.  MRI Brain 09/29/2020: No evidence of metastatic disease in the brain.  CT CAP 09/16/2020:  Interval enlargement of a perihilar nodule anteriorly in the left upper lobe, measuring 2.1 x 1.2 cm, previously 1.4 x 0.9 cm when measured similarly. This is highly concerning for a slowly enlarging primary lung malignancy.  There is a new nodule of the anterior left upper lobe measuring 1.0 cm, nonspecific although worrisome for satellite nodule or metastasis.  Unchanged enlargement of a left hilar lymph node, concerning for nodal metastasis.  Unchanged consolidation and nodularity of the infrahilar left lower lobe as well as of the perihilar right middle lobe, these findings generally nonspecific and infectious or inflammatory, appearance particularly suggesting atypical mycobacterial infection.  Re-demonstrated enlarged right axillary and retroperitoneal lymph nodes, stable to slightly diminished in size. These are in keeping with reported diagnosis of CLL.  Biopsies of LUL Lung Mass 12/08/2020  Biopsies of RUL Lung 12/08/2020    Tobacco/Marijuana/Snuff/ETOH  use: Former smoker Quit 04/2016  Past/Anticipated interventions by cardiothoracic surgery, if any:    Past/Anticipated interventions by medical oncology, if any:  Dr. Alvy Bimler 12/14/2020 -I spoke with the pathologist, he favors adenocarcinoma but due to scant tissue available, he is not able to order additional immuno-stains. -The patient is not a candidate for surgery. -Unfortunately, due to his profound immunocompromise state and chronic pancytopenia, he is not a candidate for concurrent systemic chemotherapy either. -I favor ordering Guardant 360/next generation sequencing test to look for actionable mutations. -For now, I recommend he continues his treatment for CLL. -At the completion of radiation treatment, he will repeat PET/CT imaging to look for residual disease.  I acknowledge the difficulties of interpreting his imaging study due to concurrent lymphadenopathy from CLL  Signs/Symptoms Weight changes, if any: Steady over the past month, he reports some weight gain due to eating junk foods. Respiratory complaints, if any: No Hemoptysis, if any: Has non productive cough. Pain issues, if any: No   SAFETY ISSUES: Prior radiation? No Pacemaker/ICD? No   Possible current pregnancy? N/a Is the patient on methotrexate? 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

## 2020-12-17 ENCOUNTER — Ambulatory Visit
Admission: RE | Admit: 2020-12-17 | Discharge: 2020-12-17 | Disposition: A | Discharge status: Home / Self Care | Payer: Medicare Other | Source: Ambulatory Visit | Attending: Radiation Oncology | Admitting: Radiation Oncology

## 2020-12-17 ENCOUNTER — Encounter: Payer: Self-pay | Admitting: Radiation Oncology

## 2020-12-17 ENCOUNTER — Inpatient Hospital Stay: Payer: Medicare Other | Attending: Hematology and Oncology

## 2020-12-17 ENCOUNTER — Other Ambulatory Visit: Payer: Self-pay

## 2020-12-17 ENCOUNTER — Other Ambulatory Visit: Payer: Self-pay | Admitting: Radiation Oncology

## 2020-12-17 VITALS — BP 103/69 | HR 67 | Temp 97.8°F | Resp 20 | Ht 72.0 in | Wt 173.0 lb

## 2020-12-17 DIAGNOSIS — I700 Atherosclerosis of aorta: Secondary | ICD-10-CM | POA: Diagnosis not present

## 2020-12-17 DIAGNOSIS — C3492 Malignant neoplasm of unspecified part of left bronchus or lung: Secondary | ICD-10-CM

## 2020-12-17 DIAGNOSIS — E785 Hyperlipidemia, unspecified: Secondary | ICD-10-CM | POA: Insufficient documentation

## 2020-12-17 DIAGNOSIS — J449 Chronic obstructive pulmonary disease, unspecified: Secondary | ICD-10-CM | POA: Diagnosis not present

## 2020-12-17 DIAGNOSIS — G4700 Insomnia, unspecified: Secondary | ICD-10-CM | POA: Insufficient documentation

## 2020-12-17 DIAGNOSIS — I2510 Atherosclerotic heart disease of native coronary artery without angina pectoris: Secondary | ICD-10-CM | POA: Diagnosis not present

## 2020-12-17 DIAGNOSIS — D801 Nonfamilial hypogammaglobulinemia: Secondary | ICD-10-CM | POA: Insufficient documentation

## 2020-12-17 DIAGNOSIS — Z87891 Personal history of nicotine dependence: Secondary | ICD-10-CM | POA: Insufficient documentation

## 2020-12-17 DIAGNOSIS — C3412 Malignant neoplasm of upper lobe, left bronchus or lung: Secondary | ICD-10-CM

## 2020-12-17 DIAGNOSIS — I739 Peripheral vascular disease, unspecified: Secondary | ICD-10-CM | POA: Diagnosis not present

## 2020-12-17 DIAGNOSIS — Z7984 Long term (current) use of oral hypoglycemic drugs: Secondary | ICD-10-CM | POA: Diagnosis not present

## 2020-12-17 DIAGNOSIS — I252 Old myocardial infarction: Secondary | ICD-10-CM | POA: Insufficient documentation

## 2020-12-17 DIAGNOSIS — E119 Type 2 diabetes mellitus without complications: Secondary | ICD-10-CM | POA: Diagnosis not present

## 2020-12-17 DIAGNOSIS — C8308 Small cell B-cell lymphoma, lymph nodes of multiple sites: Secondary | ICD-10-CM

## 2020-12-17 DIAGNOSIS — K219 Gastro-esophageal reflux disease without esophagitis: Secondary | ICD-10-CM | POA: Diagnosis not present

## 2020-12-17 DIAGNOSIS — Z801 Family history of malignant neoplasm of trachea, bronchus and lung: Secondary | ICD-10-CM | POA: Diagnosis not present

## 2020-12-17 DIAGNOSIS — C9110 Chronic lymphocytic leukemia of B-cell type not having achieved remission: Secondary | ICD-10-CM | POA: Insufficient documentation

## 2020-12-17 NOTE — Progress Notes (Signed)
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 Diagnoses of Non-small cell carcinoma of lung, left (HCC) and Malignant neoplasm of upper lobe, left bronchus or lung ([place]) were also pertinent to this visit.   HISTORY OF PRESENT ILLNESS: Ryan Copeland is a 70 y.o. male seen at the request of Dr. Valeta Harms for a newly diagnosed left upper lobe lung cancer. 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 Alvy Bimler. He takes Calquence. He was being followed for his disease and a PET in August 2022 showed hypermetabolic activity in a 1 cm nodule in the left upper lobe, hilar adenopathy as well as right axillary and retroperitoneal adenopathy.  Given these findings he was referred for evaluation with pulmonary medicine, he had also been to the emergency department with possible sepsis and at that time a CT head without contrast was ordered on 11/02/2020 this was negative for acute findings, CT chest abdomen pelvis with contrast again showed consolidation in the left lower lobe with concerns for pneumonia and new patchy groundglass airspace disease in the right upper lobe and right lower lobe likely infectious with bilateral effusions that had been noted previously and decreased in size unchanged splenomegaly right renal lesion and mediastinal left hilar retroperitoneal and central mesenteric lymphadenopathy consistent with his lymphoma/CLL were noted.  He met with Dr. Valeta Harms and underwent bronchoscopy on 12/08/2020 which revealed malignancy consistent with non-small cell lung cancer  as well as needle biopsies of the left upper lobe again confirming malignancy.  The brushings and fine-needle aspirate of the right upper lobe were negative for disease.  Given these findings he is seen to discuss stereotactic body radiotherapy to the left upper lobe.    PREVIOUS RADIATION THERAPY: No   PAST MEDICAL HISTORY:  Past Medical History:  Diagnosis Date   Anxiety 01/30/2014   Back injury    lower disc   CAD (coronary artery disease)    CLL (chronic lymphocytic leukemia) ([place]) 03/18/2011   COPD (chronic obstructive pulmonary disease) (HCC)    DM type 2 (diabetes mellitus, type 2) (HCC)    ECRB (extensor carpi radialis brevis) tenosynovitis    Foley catheter in place 11/2019   last changed 04-14-2020   GERD (gastroesophageal reflux disease)    takes Nexium if needed   History of blood transfusion 2015   Hyperlipidemia    Insomnia    Long-term current use of intravenous immunoglobulin (IVIG)    q month ivig   MI, acute, non ST segment elevation ([place]) 06/28/2009   with stenting of the LAD   Neuromuscular disorder ([place])    peripheral neuropathy both all toes   Pneumonia 11/2019   10/2020   PVD (peripheral vascular disease) ([place])    Thrombocytopenia ([place])    Tobacco abuse        PAST SURGICAL HISTORY: Past Surgical History:  Procedure Laterality Date   ADENOIDECTOMY  1955   BRONCHIAL BIOPSY  12/08/2020   Procedure: BRONCHIAL BIOPSIES;  Surgeon: [name], DO;  Location: [place];  Service: Pulmonary;;   BRONCHIAL BRUSHINGS  12/08/2020   Procedure: BRONCHIAL BRUSHINGS;  Surgeon: [name], DO;  Location: [facility];  Service: Pulmonary;;   BRONCHIAL NEEDLE ASPIRATION BIOPSY  12/08/2020   Procedure: BRONCHIAL NEEDLE ASPIRATION BIOPSIES;  Surgeon: [name], DO;  Location: [place];  Service: Pulmonary;;   CARDIAC CATHETERIZATION     CARDIAC CATHETERIZATION N/A 09/26/2014   Procedure: Left Heart Cath and Coronary Angiography;  Surgeon:  Peter M Martinique, MD;  Location: [place] [facility];  Service: Cardiovascular;  Laterality: N/A;   carpel tunnel release Left 04-1989   carpel tunnel release  Right 01-1989   CHOLECYSTECTOMY  2007   [place]   CORONARY STENT PLACEMENT  06/2009   stent to lad   CYSTOSCOPY N/A 04/20/2020   Procedure: [place];  Surgeon: Janith Lima, MD;  Location: [facility];  Service: Urology;  Laterality: N/A;  ONLY NEEDS 30 MIN   femoral stents  09/2014   iliac stent   FIDUCIAL MARKER PLACEMENT  12/08/2020   Procedure: FIDUCIAL MARKER PLACEMENT;  Surgeon: [name], DO;  Location: [place] [facility];  Service: Pulmonary;;   IR CV LINE INJECTION  08/18/2017   IR CV LINE INJECTION  09/01/2017   IR CV LINE INJECTION  02/02/2018   LATERAL EPICONDYLE RELEASE Left 02/12/2014   Procedure: LEFT ELBOW DEBRIDEMENT WITH TENDON REPAIR ;  Surgeon: Lorn Junes, MD;  Location: [place];  Service: Orthopedics;  Laterality: Left;   LEFT CAI STENT/PTA AND POPLITEAL ARTERY/TIBIAL THROMBECTOMY      LEFT HEART CATHETERIZATION WITH CORONARY ANGIOGRAM N/A 08/26/2011   Procedure: LEFT HEART CATHETERIZATION WITH CORONARY ANGIOGRAM;  Surgeon: Peter M Martinique, MD;  Location: [facility];  Service: Cardiovascular;  Laterality: N/A;   PERIPHERAL VASCULAR CATHETERIZATION N/A 01/01/2015   Procedure: Abdominal Aortogram;  Surgeon: Conrad Calcium, MD;  Location: [place] [facility];  Service: Cardiovascular;  Laterality: N/A;   port a cath insertion  2014   right    TARSAL TUNNEL RELEASE Bilateral 08-2007   VIDEO BRONCHOSCOPY WITH ENDOBRONCHIAL NAVIGATION Left 12/08/2020   Procedure: VIDEO BRONCHOSCOPY WITH ENDOBRONCHIAL NAVIGATION;  Surgeon: [name], DO;  Location: [place];  Service: Pulmonary;  Laterality: Left;  ION w/ fiducial placement   VIDEO BRONCHOSCOPY WITH RADIAL ENDOBRONCHIAL ULTRASOUND  12/08/2020   Procedure: RADIAL ENDOBRONCHIAL ULTRASOUND;  Surgeon: [name], DO;   Location: [facility];  Service: Pulmonary;;     FAMILY HISTORY:  Family History  Problem Relation Age of Onset   Lung cancer Mother 60   Cancer Mother        lung   Heart failure Father 59   Heart disease Father      SOCIAL HISTORY:  reports that he quit smoking about 4 years ago. His smoking use included cigarettes. He has a 44.00 pack-year smoking history. He has never used smokeless tobacco. He reports that he does not currently use alcohol. He reports that he does not use drugs. The patient is married and lives in [place]. He is a retired Chief Financial Officer. They have two adult children, one of whom is moving back to the area this weekend.   ALLERGIES: Immune globulin and Codeine   MEDICATIONS:  Current Outpatient Medications  Medication Sig Dispense Refill   Acalabrutinib Maleate (CALQUENCE) 100 MG TABS Take 1 tablet (100 mg) by mouth 2 (two) times daily. 60 tablet 1   acetaminophen (TYLENOL) 325 MG tablet Take 2 tablets (650  mg total) by mouth every 6 (six) hours as needed for moderate pain or fever. 30 tablet 0   acyclovir (ZOVIRAX) 400 MG tablet TAKE 1 TABLET BY MOUTH TWICE A DAY (Patient taking differently: Take 400 mg by mouth 2 (two) times daily.) 180 tablet 1   allopurinol (ZYLOPRIM) 300 MG tablet Take 1 tablet (300 mg total) by mouth daily. 90 tablet 1   ALPRAZolam (XANAX) 0.5 MG tablet Take 0.5 mg by mouth at bedtime as needed for anxiety.     b complex vitamins tablet Take 1 tablet by mouth daily.      benzonatate (TESSALON) 100 MG capsule TAKE TWO CAPSULES BY MOUTH THREE TIMES A DAY AS NEEDED 90 capsule 1   Cholecalciferol 25 MCG (1000 UT) tablet Take 1,000 Units by mouth daily.      diphenoxylate-atropine (LOMOTIL) 2.5-0.025 MG tablet TAKE 1 TABLET BY MOUTH 4 (FOUR) TIMES DAILY AS NEEDED FOR DIARRHEA OR LOOSE STOOLS. 60 tablet 1   famotidine (PEPCID) 20 MG tablet TAKE 1 TABLET BY MOUTH TWICE A DAY (Patient taking differently: Take 20 mg by mouth 2 (two) times daily as  needed for heartburn.) 180 tablet 1   furosemide (LASIX) 20 MG tablet Take 20 mg by mouth daily.     HYDROcodone-acetaminophen (NORCO) 10-325 MG tablet Take 1 tablet by mouth every 6 (six) hours as needed for moderate pain.     lidocaine-prilocaine (EMLA) cream Apply 1 application topically as needed (prior chemo/IBIG). 30 g 9   loperamide (IMODIUM) 2 MG capsule Take 2 capsules (4 mg total) by mouth 4 (four) times daily. (Patient taking differently: Take 4 mg by mouth 4 (four) times daily as needed for diarrhea or loose stools.) 120 capsule 9   meclizine (ANTIVERT) 12.5 MG tablet Take 12.5 mg by mouth 3 (three) times daily as needed for dizziness.     metFORMIN (GLUCOPHAGE-XR) 500 MG 24 hr tablet Take 500 mg by mouth every evening.     midodrine (PROAMATINE) 5 MG tablet Take 3 tablets (15 mg total) by mouth 3 (three) times daily with meals. 270 tablet 2   montelukast (SINGULAIR) 10 MG tablet TAKE 1 TABLET BY MOUTH EVERY DAY (Patient taking differently: Take 10 mg by mouth at bedtime.) 90 tablet 1   nitroGLYCERIN (NITROSTAT) 0.4 MG SL tablet PLACE 1 TABLET UNDER THE TONGUE EVERY 5 MINUTES X 3 DOSES AS NEEDED FOR CHEST PAIN *MAX 3 DOSES* 25 tablet 7   oxybutynin (DITROPAN) 5 MG tablet Take 5 mg by mouth 2 (two) times daily as needed.     pregabalin (LYRICA) 200 MG capsule TAKE 1 CAPSULE (200 MG TOTAL) BY MOUTH 3 (THREE) TIMES DAILY. 270 capsule 1   rosuvastatin (CRESTOR) 5 MG tablet Take 1 tablet (5 mg total) by mouth daily. 90 tablet 3   sildenafil (VIAGRA) 100 MG tablet Take 0.5 tablets (50 mg total) by mouth as needed for erectile dysfunction (30 mintues prior to sexual intercourse). 10 tablet 3   tamsulosin (FLOMAX) 0.4 MG CAPS capsule Take 1 capsule (0.4 mg total) by mouth daily. 30 capsule 0   zolpidem (AMBIEN) 10 MG tablet Take 10 mg by mouth at bedtime as needed for sleep.     No current facility-administered medications for this encounter.   Facility-Administered Medications Ordered in  Other Encounters  Medication Dose Route Frequency Provider Last Rate Last Admin   0.9 %  sodium chloride infusion   Intravenous Continuous [name], MD 50 mL/hr at 03/07/14 1005 New Bag at 03/07/14  1005   sodium chloride 0.9 % injection 10 mL  10 mL Intracatheter PRN Marcy Panning, MD   10 mL at 08/22/12 1721     REVIEW OF SYSTEMS: On review of systems, the patient reports that he does not have any shortness of breath. He has an occasional dry cough, but no hemoptysis. He's had 50+ pounds of unintended weight loss in the last year, but after a hospitalization this September, he lost about 10 pounds of which he's trying to gain back, and he's almost halfway there. He has easy bruising. He has worked with PT recently but not for lymphedema as his appointments are labeled. No other complaints are noted.        PHYSICAL EXAM:  Wt Readings from Last 3 Encounters:  12/17/20 173 lb (78.5 kg)  12/14/20 172 lb 9.6 oz (78.3 kg)  12/14/20 172 lb 6.4 oz (78.2 kg)   Temp Readings from Last 3 Encounters:  12/17/20 97.8 F (36.6 C)  12/14/20 (!) 96.8 F (36 C) (Tympanic)  12/14/20 98.5 F (36.9 C)   BP Readings from Last 3 Encounters:  12/17/20 103/69  12/14/20 129/61  12/14/20 122/78   Pulse Readings from Last 3 Encounters:  12/17/20 67  12/14/20 65  12/14/20 68   Pain Assessment Pain Score: 0-No pain/10  In general this is a a chronically ill appearing Caucasian male in no acute distress. He's alert and oriented x4 and appropriate throughout the examination. Cardiopulmonary assessment is negative for acute distress and he exhibits normal effort.    ECOG = 1  0 - Asymptomatic (Fully active, able to carry on all predisease activities without restriction)  1 - Symptomatic but completely ambulatory (Restricted in physically strenuous activity but ambulatory and able to carry out work of a light or sedentary nature. For example, light housework, office work)  2 - Symptomatic, <50% in  bed during the day (Ambulatory and capable of all self care but unable to carry out any work activities. Up and about more than 50% of waking hours)  3 - Symptomatic, >50% in bed, but not bedbound (Capable of only limited self-care, confined to bed or chair 50% or more of waking hours)  4 - Bedbound (Completely disabled. Cannot carry on any self-care. Totally confined to bed or chair)  5 - Death   Eustace Pen MM, Creech RH, Tormey DC, et al. [phone]). "Toxicity and response criteria of the [facility]". [place] Oncol. 5 (6): 649-55    LABORATORY DATA:  Lab Results  Component Value Date   WBC 9.8 12/04/2020   HGB 11.6 (L) 12/04/2020   HCT 37.3 (L) 12/04/2020   MCV 91.9 12/04/2020   PLT 84 (L) 12/04/2020   Lab Results  Component Value Date   NA 138 12/04/2020   K 4.1 12/04/2020   CL 107 12/04/2020   CO2 26 12/04/2020   Lab Results  Component Value Date   ALT 30 12/04/2020   AST 29 12/04/2020   ALKPHOS 102 12/04/2020   BILITOT 0.4 12/04/2020      RADIOGRAPHY: DG CHEST PORT 1 VIEW  Result Date: 12/08/2020 CLINICAL DATA:  Status post bronchoscopy with biopsy. EXAM: PORTABLE CHEST 1 VIEW COMPARISON:  Chest x-ray 11/02/2020.  CT chest 12/01/2020. FINDINGS: Right-sided chest port catheter tip projects over the mid SVC, unchanged. The cardiomediastinal silhouette is within normal limits. Focal nodular density in the left mid lung is again seen. There is a new radiopaque marker at this level. There is also  a new radiopaque marker projecting over the right mid lung. There is some strandy opacities in the lung bases favored is atelectasis. There is no pleural effusion or pneumothorax identified. No acute fractures identified. IMPRESSION: 1. No acute abnormality. 2. Stable nodular density in the left mid lung with new bilateral radiopaque markers. Electronically Signed   By: Ronney Asters M.D.   On: 12/08/2020 15:06   CT Super D Chest Wo Contrast  Result Date:  12/04/2020 CLINICAL DATA:  Follow-up pneumonia.  Lung nodule. EXAM: CT CHEST WITHOUT CONTRAST TECHNIQUE: Multidetector CT imaging of the chest was performed using thin slice collimation for electromagnetic bronchoscopy planning purposes, without intravenous contrast. COMPARISON:  CT 11/02/2020 on PET-CT 10/02/2020, CT 09/16/2020 FINDINGS: Cardiovascular: Port in the anterior chest wall with tip in distal SVC. Coronary artery calcification and aortic atherosclerotic calcification. Mediastinum/Nodes: No axillary or supraclavicular adenopathy. No mediastinal or hilar adenopathy. No pericardial fluid. Esophagus normal. Lungs/Pleura: Lobular nodule in the LEFT upper lobe measures 12 mm (image 74/7) compared to 10 mm on CT 09/16/2020. Interval near complete resolution of LEFT lower lobe consolidation with only residual medial mild consolidation remaining measuring 2.5 cm (115/7). Complete resolution of the RIGHT lower lobe consolidation. Resolution of the pleural effusions. Within the RIGHT middle lobe 5 mm nodule (image 108/7) is unchanged. Upper Abdomen: Limited view of the liver, kidneys, pancreas are unremarkable. Normal adrenal glands. Musculoskeletal: No aggressive osseous lesion. IMPRESSION: 1. Persistent LEFT upper lobe pulmonary nodule with suspicious morphology. Lesion was hypermetabolic on comparison PET-CT scan. Nodule concerning for bronchogenic carcinoma. 2. Near complete resolution of LEFT lower lobe consolidation and complete resolution of RIGHT lower lobe consolidation. 3. Stable small RIGHT middle lobe pulmonary nodule. Electronically Signed   By: Suzy Bouchard M.D.   On: 12/04/2020 07:58   DG C-ARM BRONCHOSCOPY  Result Date: 12/08/2020 C-ARM BRONCHOSCOPY: Fluoroscopy was utilized by the requesting physician.  No radiographic interpretation.       IMPRESSION/PLAN: 1. At least Stage IA2, cT1bN0M0, NSCLC, of the LUL. Dr. Lisbeth Renshaw discusses the pathology findings and reviews the nature of lung  cancer. While his staging is somewhat unclear, his adenopathy in the chest and retroperitoneum predate his diagnosis of lung cancer. He did not have his nodes sampled at the time of bronchoscopy, and if they persist perhaps these could be sampled, however given the low level of metabolic activity, Dr. Lisbeth Renshaw favors stereotactic body radiotherapy (SBRT) to the LUL with close followup. We discussed the risks, benefits, short, and long term effects of radiotherapy, as well as the curative intent, and the patient is interested in proceeding. Dr. Lisbeth Renshaw discusses the delivery and logistics of radiotherapy and anticipates a course of 3-5 fractions of radiotherapy.  Written consent is obtained and placed in the chart, a copy was provided to the patient. He will receive a call to proceed with simulation by our staff. 2. CLL/Lymphoma. The patient will continue to follow with Dr. Alvy Bimler and continue Calquence.   In a visit lasting 60 minutes, greater than 50% of the time was spent face to face discussing the patient's condition, in preparation for the discussion, and coordinating the patient's care.  The above documentation reflects my direct findings during this shared patient visit. Please see the separate note by Dr. Lisbeth Renshaw on this date for the remainder of the patient's plan of care.    Carola Rhine, [facility]   **Disclaimer: This note was dictated with voice recognition software. Similar sounding words can inadvertently be transcribed and this note may  contain transcription errors which may not have been corrected upon publication of note.**

## 2020-12-18 ENCOUNTER — Other Ambulatory Visit (HOSPITAL_COMMUNITY): Payer: Self-pay

## 2020-12-18 ENCOUNTER — Other Ambulatory Visit: Payer: Self-pay

## 2020-12-18 DIAGNOSIS — C3492 Malignant neoplasm of unspecified part of left bronchus or lung: Secondary | ICD-10-CM

## 2020-12-24 ENCOUNTER — Other Ambulatory Visit (HOSPITAL_COMMUNITY): Payer: Self-pay

## 2020-12-25 ENCOUNTER — Telehealth: Payer: Self-pay | Admitting: Pulmonary Disease

## 2020-12-25 MED ORDER — HYDROCODONE BIT-HOMATROP MBR 5-1.5 MG/5ML PO SOLN: 5.0000 mL | Freq: Four times a day (QID) | ORAL | 0 refills | Status: DC | PRN

## 2020-12-25 NOTE — Telephone Encounter (Signed)
Spoke with the pt  He states his cough has been much worse since he had bronch 12/08/20  He states it's esp worse at night and keeping his and his spouse awake  He occ will cough up some clear sputum  No wheezing or increased SOB, fevers, aches  He had tried tessalon and some otc cough syrups without relief  He has some hydocan left over from PCP but very small amount left and this helps some  He denies any obvious reflux symptoms and is no longer taking pepid  Can not take any PPI due to being on calquence so he stopped nexium a while back  Denies nasal/sinus drainage  He is taking his singulair  Please advise thanks!  Allergies  Allergen Reactions   Immune Globulin Hives and Rash    Pt reports hives/rash on body with IV Privigen. He was changed to IV Gamunex-C and had many infusions without adverse side effects.    Codeine Hives    Pt states he can take a few, more reaction with extended doses.

## 2020-12-25 NOTE — Telephone Encounter (Signed)
ATC Patient.  Left detailed message on VM (DPR), letting Patient know Dr. Valeta Harms sent Patient a refill to pharmacy, and call office with any problems.

## 2020-12-25 NOTE — Telephone Encounter (Signed)
Refill sent to pharmacy  Opiate cough suppressants are not good for him to stay on forever But short term relief is ok   [name], DO [facility] 12/25/2020 2:33 PM

## 2020-12-25 NOTE — Telephone Encounter (Signed)
See telephone encounter.

## 2020-12-28 ENCOUNTER — Other Ambulatory Visit (HOSPITAL_COMMUNITY): Payer: Self-pay

## 2020-12-28 ENCOUNTER — Telehealth: Payer: Self-pay | Admitting: Pulmonary Disease

## 2020-12-28 ENCOUNTER — Ambulatory Visit: Admission: RE | Admit: 2020-12-28 | Payer: Medicare Other | Source: Ambulatory Visit | Admitting: Radiation Oncology

## 2020-12-28 ENCOUNTER — Other Ambulatory Visit: Payer: Self-pay

## 2020-12-28 DIAGNOSIS — C3412 Malignant neoplasm of upper lobe, left bronchus or lung: Secondary | ICD-10-CM | POA: Diagnosis present

## 2020-12-28 DIAGNOSIS — Z510 Encounter for antineoplastic radiation therapy: Secondary | ICD-10-CM | POA: Diagnosis not present

## 2020-12-28 NOTE — Telephone Encounter (Signed)
Is this ok for the patient to switch providers? Please advise.

## 2020-12-29 ENCOUNTER — Ambulatory Visit: Payer: Medicare Other | Admitting: Physical Therapy

## 2020-12-29 NOTE — Telephone Encounter (Signed)
yes

## 2020-12-29 NOTE — Telephone Encounter (Signed)
Appt made.   Nothing further needed at this time.  

## 2020-12-31 ENCOUNTER — Ambulatory Visit: Payer: Medicare Other

## 2020-12-31 ENCOUNTER — Other Ambulatory Visit: Payer: Self-pay

## 2020-12-31 DIAGNOSIS — C3492 Malignant neoplasm of unspecified part of left bronchus or lung: Secondary | ICD-10-CM

## 2020-12-31 DIAGNOSIS — R262 Difficulty in walking, not elsewhere classified: Secondary | ICD-10-CM

## 2020-12-31 DIAGNOSIS — R2689 Other abnormalities of gait and mobility: Secondary | ICD-10-CM

## 2020-12-31 DIAGNOSIS — M6281 Muscle weakness (generalized): Secondary | ICD-10-CM

## 2020-12-31 NOTE — Therapy (Addendum)
[facility] @ [place], [place], [address] Phone: [phone]   Fax:  [phone]  Physical Therapy Treatment  Patient Details  Name: Ryan Copeland MRN: [id number] Date of Birth: 1950-05-28 Referring Provider (PT): Alvy Bimler   Encounter Date: 12/31/2020   PT End of Session - 12/31/20 1518     Visit Number 9    Number of Visits 16    Date for PT Re-Evaluation 02/17/21    PT Start Time 6962    PT Stop Time 1503    PT Time Calculation (min) 59 min    Activity Tolerance Patient tolerated treatment well    Behavior During Therapy [facility] for tasks assessed/performed             Past Medical History:  Diagnosis Date   Anxiety 01/30/2014   Back injury    lower disc   CAD (coronary artery disease)    CLL (chronic lymphocytic leukemia) ([place]) 03/18/2011   COPD (chronic obstructive pulmonary disease) ([place])    DM type 2 (diabetes mellitus, type 2) ([place])    ECRB (extensor carpi radialis brevis) tenosynovitis    Foley catheter in place 11/2019   last changed 04-14-2020   GERD (gastroesophageal reflux disease)    takes Nexium if needed   History of blood transfusion 2015   Hyperlipidemia    Insomnia    Long-term current use of intravenous immunoglobulin (IVIG)    q month ivig   MI, acute, non ST segment elevation ([place]) 06/28/2009   with stenting of the LAD   Neuromuscular disorder ([place])    peripheral neuropathy both all toes   Pneumonia 11/2019   10/2020   PVD (peripheral vascular disease) ([place])    Thrombocytopenia ([place])    Tobacco abuse     Past Surgical History:  Procedure Laterality Date   ADENOIDECTOMY  1955   BRONCHIAL BIOPSY  12/08/2020   Procedure: BRONCHIAL BIOPSIES;  Surgeon: [name], DO;  Location: [facility];  Service: Pulmonary;;   BRONCHIAL BRUSHINGS  12/08/2020   Procedure: BRONCHIAL BRUSHINGS;  Surgeon: [name], DO;  Location: [facility];  Service: Pulmonary;;    BRONCHIAL NEEDLE ASPIRATION BIOPSY  12/08/2020   Procedure: BRONCHIAL NEEDLE ASPIRATION BIOPSIES;  Surgeon: [name], DO;  Location: [facility];  Service: Pulmonary;;   CARDIAC CATHETERIZATION     CARDIAC CATHETERIZATION N/A 09/26/2014   Procedure: Left Heart Cath and Coronary Angiography;  Surgeon: Peter M Martinique, MD;  Location: [facility];  Service: Cardiovascular;  Laterality: N/A;   carpel tunnel release Left 04-1989   carpel tunnel release  Right 01-1989   CHOLECYSTECTOMY  2007   [place]   CORONARY STENT PLACEMENT  06/2009   stent to lad   CYSTOSCOPY N/A 04/20/2020   Procedure: [place];  Surgeon: Janith Lima, MD;  Location: [facility];  Service: Urology;  Laterality: N/A;  ONLY NEEDS 30 MIN   femoral stents  09/2014   iliac stent   FIDUCIAL MARKER PLACEMENT  12/08/2020   Procedure: FIDUCIAL MARKER PLACEMENT;  Surgeon: [name], DO;  Location: [facility];  Service: Pulmonary;;   IR CV LINE INJECTION  08/18/2017   IR CV LINE INJECTION  09/01/2017   IR CV LINE INJECTION  02/02/2018   LATERAL EPICONDYLE RELEASE Left 02/12/2014   Procedure: LEFT ELBOW DEBRIDEMENT WITH TENDON REPAIR ;  Surgeon: Lorn Junes, MD;  Location: [facility];  Service: Orthopedics;  Laterality: Left;   LEFT CAI STENT/PTA AND POPLITEAL ARTERY/TIBIAL THROMBECTOMY      LEFT HEART CATHETERIZATION WITH CORONARY ANGIOGRAM N/A 08/26/2011   Procedure: LEFT HEART CATHETERIZATION WITH CORONARY ANGIOGRAM;  Surgeon: Peter M Martinique, MD;  Location: [facility];  Service: Cardiovascular;  Laterality: N/A;   PERIPHERAL VASCULAR CATHETERIZATION N/A 01/01/2015   Procedure: Abdominal Aortogram;  Surgeon: Conrad [place], MD;  Location: [facility];  Service: Cardiovascular;  Laterality: N/A;   port a cath insertion  2014   right    TARSAL TUNNEL RELEASE Bilateral 08-2007   VIDEO BRONCHOSCOPY WITH ENDOBRONCHIAL NAVIGATION Left 12/08/2020   Procedure: VIDEO  BRONCHOSCOPY WITH ENDOBRONCHIAL NAVIGATION;  Surgeon: [name], DO;  Location: [facility];  Service: Pulmonary;  Laterality: Left;  ION w/ fiducial placement   VIDEO BRONCHOSCOPY WITH RADIAL ENDOBRONCHIAL ULTRASOUND  12/08/2020   Procedure: RADIAL ENDOBRONCHIAL ULTRASOUND;  Surgeon: [name], DO;  Location: [facility];  Service: Pulmonary;;    There were no vitals filed for this visit.   Subjective Assessment - 12/31/20 1406     Subjective I start radiation for the new recurrence on 11/29 and I only need 3 treatments total. They are doing a high beam.    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 No/denies                [facility] PT Assessment - 12/31/20 0001       6 minute walk test results    Aerobic Endurance Distance Walked 966    Endurance additional comments Pt required VCs/encouragement to increase pace but was able to keep up with therapist when increased speed slightly                           [facility] Adult PT Treatment/Exercise - 12/31/20 0001       High Level Balance   High Level Balance Comments In // bars: Front and retro tandem walking (first front with eyes closed but kept fingers on bars for all).      Knee/Hip Exercises: Aerobic   Nustep Level 5, UE/LE, seat at 10 and arms at 9; x13 mins with therapist monitoring pt for fatigue;  756 steps      Knee/Hip Exercises: Machines for Strengthening   Total Gym Leg Press 80#, 2x10, 90# x10 with therapist monitoring pt for discomfort, VCs for correct foot alignment on plate throughout as he kept everting      Knee/Hip Exercises: Standing   Hip Flexion  Stengthening;Right;Left;15 reps    Hip Flexion Limitations On Airex,  5# each ankle, +2 HHA in // bars    Hip Abduction Stengthening;Right;Left;15 reps    Abduction Limitations On Airex, 5# each ankle, +2 HHA in // bars    Hip Extension Stengthening;Right;Left;15 reps    Extension Limitations On Airex, 5# each ankle, +2 HHA in // bars    Functional Squat 15 reps    Functional Squat Limitations on bosu in // bars returning therapist demo    Other Standing Knee Exercises lunge on bosu ball with hand hold on bars x 10 each leg  Other Standing Knee Exercises Sit to stand from mat holding 4#, then to [facility] press 5x, challenging for pt                          PT Long Term Goals - 12/15/20 1413       PT LONG TERM GOAL #1   Title Pt will be able to sit to stand from a chair 8 times in 30 seconds without use of UEs to decrease fall risk.    Baseline able to do sit to stand without hands x 1    Status On-going      PT LONG TERM GOAL #2   Title Pt will demonstrate 4/5 bilateral hip extensor strength to help decrease fall risk    Baseline 12/15/20: Rt: and Lt: 4-/5    Status Partially Met      PT LONG TERM GOAL #3   Title Pt will demonstrate 5/5 bilateral hip flexor strength to decrease fall risk    Baseline 12/15/20: 4+/5 bil    Status Partially Met      PT LONG TERM GOAL #4   Title Pt will demonstrate 5/5 bilateral shoulder flexion strength to allow pt to return to PLOF where he was able to lift 50 lb bags of gravel.    Baseline still unable to lift the bags of gravel    Status On-going      PT LONG TERM GOAL #5   Title Pt will be able to get in his truck without increased difficulty.    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 He continues to do excellent during sessions. Progressed him today with addition of leg press and did 6 min walk test. He is able to ambulate 966 ft in 6 mins without requiring a rest break and no SOB after. this will act as a baseline so we can reassess after radiation when he returns. Overall after session today just reports his legs feeling a little shaky. He will be on hold for next few weeks to have his radiation treatments and then plans to resume physical therapy at that time.    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, chronic lymphocytic leukemia, pancytopenia, bilateral LE edema, debility, COPD, DMT2, chronic foley, GERD, CAD, HLD, prior MI, PVD, and chronic hypotension    Examination-Activity Limitations Lift;Stairs;Carry;Transfers    Examination-Participation Restrictions Laundry;Cleaning;Shop;Community Activity;Yard Work    Merchant navy officer Evolving/Moderate complexity    Rehab Potential Good    PT Frequency 2x / week    PT Duration 8 weeks    PT Treatment/Interventions ADLs/Self Care Home Management;Therapeutic activities;Therapeutic exercise;Gait training;Stair training;Functional mobility training;Balance training;Neuromuscular  re-education;Patient/family education;Manual techniques    PT Next Visit Plan Pt on hold until completing radiation. Cont LE strengthening , stair ambulation/step ups, high level balance, reassess 6 min walk test.    PT Home Exercise Plan bil LE and UE strength and balance activities; bil ankle 4 way with yellow theraband    Consulted and Agree with Plan of Care Patient              Patient will benefit from skilled therapeutic intervention in order to improve the following deficits and impairments:  Pain, Postural dysfunction, Impaired UE functional use, Decreased strength, Decreased activity tolerance, Difficulty walking, Decreased endurance, Decreased balance, Abnormal gait, Decreased mobility  Visit Diagnosis: Other abnormalities of gait and mobility  Muscle weakness (generalized)  Difficulty in walking, not elsewhere classified  Non-small cell carcinoma of left lung (HCC)     Problem List Patient Active Problem List   Diagnosis Date Noted   Bilateral leg edema 10/27/2020   Physical debility 10/27/2020   Lung nodule 09/23/2020   Non-small cell carcinoma of lung, left ([place]) 09/18/2020   Head and neck lymphadenopathy 32/20/2542   Complicated UTI (urinary tract infection) 07/18/2020   Gum lesion 05/29/2020   Acute lower UTI 02/16/2020   Bladder pain 02/16/2020   Chronic hypotension 02/16/2020   Vitamin B12 deficiency 01/01/2020   Hypotension due to drugs 01/01/2020   Protein-calorie malnutrition, moderate ([place]) 01/01/2020   Septic shock ([place]) 12/15/2019   Community acquired pneumonia 11/28/2019   Cerebral vascular disease 11/25/2019   CIDP (chronic inflammatory demyelinating polyneuropathy) ([place]) 11/25/2019   Other fatigue 10/25/2019   Deficiency anemia 10/25/2019   Dizziness 10/22/2019   Gait abnormality 10/22/2019   Hx of nonmelanoma skin cancer 03/15/2019   Essential hypertension 04/24/2018   Influenza A 04/17/2018   Sepsis ([place]) 04/17/2018   Chronic diastolic CHF (congestive heart failure) ([place]) 04/17/2018   Skin rash 08/07/2017   Port-A-Cath in place 08/04/2017   Chronic apical periodontitis 04/26/2017   Retained dental roots 04/26/2017   Dental caries 04/26/2017   Chronic periodontitis 04/26/2017   Loose, teeth 04/26/2017   Peripheral polyneuropathy 04/14/2017   COPD mixed type ([place]) 02/23/2017   Chronic sinusitis 01/27/2017    Splenomegaly, congestive, chronic 12/29/2016   Diarrhea 12/01/2016   Poor dentition 12/01/2016   Benign mole 12/01/2016   Pancytopenia, acquired ([place]) 11/03/2016   Odynophagia 01/15/2016   Polycythemia, secondary 06/26/2015   Quality of life palliative care encounter 06/26/2015   Atherosclerosis of extremity with intermittent claudication ([place]) 01/23/2015   Lateral epicondylitis of left elbow    ECRB (extensor carpi radialis brevis) tenosynovitis    Anxiety 01/30/2014   Preoperative clearance 01/30/2014   Thrombocytopenia ([place]) 01/30/2014   Diabetes mellitus without complication ([place]) 70/62/3762   Hypogammaglobulinemia, acquired ([place]) 12/26/2012   Hereditary and idiopathic peripheral neuropathy 08/20/2012   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[place]) 08/20/2012   CAD (coronary artery disease) 07/29/2011   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     Education / Equipment: Pt deceased

## 2021-01-01 ENCOUNTER — Telehealth: Payer: Self-pay

## 2021-01-01 ENCOUNTER — Inpatient Hospital Stay: Payer: Medicare Other

## 2021-01-01 ENCOUNTER — Other Ambulatory Visit: Payer: Self-pay | Admitting: Hematology and Oncology

## 2021-01-01 VITALS — BP 103/53 | HR 60 | Temp 97.7°F | Resp 16 | Wt 169.0 lb

## 2021-01-01 DIAGNOSIS — Z95828 Presence of other vascular implants and grafts: Secondary | ICD-10-CM

## 2021-01-01 DIAGNOSIS — D801 Nonfamilial hypogammaglobulinemia: Secondary | ICD-10-CM

## 2021-01-01 DIAGNOSIS — C8308 Small cell B-cell lymphoma, lymph nodes of multiple sites: Secondary | ICD-10-CM

## 2021-01-01 DIAGNOSIS — C9110 Chronic lymphocytic leukemia of B-cell type not having achieved remission: Secondary | ICD-10-CM | POA: Diagnosis not present

## 2021-01-01 LAB — CBC WITH DIFFERENTIAL/PLATELET
Abs Immature Granulocytes: 0.03 10*3/uL (ref 0.00–0.07)
Basophils Absolute: 0 10*3/uL (ref 0.0–0.1)
Basophils Relative: 0 %
Eosinophils Absolute: 0.1 10*3/uL (ref 0.0–0.5)
Eosinophils Relative: 0 %
HCT: 36.6 % — ABNORMAL LOW (ref 39.0–52.0)
Hemoglobin: 11.7 g/dL — ABNORMAL LOW (ref 13.0–17.0)
Immature Granulocytes: 0 %
Lymphocytes Relative: 86 %
Lymphs Abs: 15.8 10*3/uL — ABNORMAL HIGH (ref 0.7–4.0)
MCH: 28.5 pg (ref 26.0–34.0)
MCHC: 32 g/dL (ref 30.0–36.0)
MCV: 89.3 fL (ref 80.0–100.0)
Monocytes Absolute: 0.4 10*3/uL (ref 0.1–1.0)
Monocytes Relative: 2 %
Neutro Abs: 2.2 10*3/uL (ref 1.7–7.7)
Neutrophils Relative %: 12 %
Platelets: 104 10*3/uL — ABNORMAL LOW (ref 150–400)
RBC: 4.1 MIL/uL — ABNORMAL LOW (ref 4.22–5.81)
RDW: 17.1 % — ABNORMAL HIGH (ref 11.5–15.5)
Smear Review: NORMAL
WBC: 18.5 10*3/uL — ABNORMAL HIGH (ref 4.0–10.5)
nRBC: 0 % (ref 0.0–0.2)

## 2021-01-01 LAB — COMPREHENSIVE METABOLIC PANEL
ALT: 39 U/L (ref 0–44)
AST: 34 U/L (ref 15–41)
Albumin: 3.8 g/dL (ref 3.5–5.0)
Alkaline Phosphatase: 122 U/L (ref 38–126)
Anion gap: 10 (ref 5–15)
BUN: 17 mg/dL (ref 8–23)
CO2: 24 mmol/L (ref 22–32)
Calcium: 9.1 mg/dL (ref 8.9–10.3)
Chloride: 105 mmol/L (ref 98–111)
Creatinine, Ser: 0.83 mg/dL (ref 0.61–1.24)
GFR, Estimated: >60 mL/min (ref >60)
Glucose, Bld: 108 mg/dL — ABNORMAL HIGH (ref 70–99)
Potassium: 4.6 mmol/L (ref 3.5–5.1)
Sodium: 139 mmol/L (ref 135–145)
Total Bilirubin: 0.3 mg/dL (ref 0.3–1.2)
Total Protein: 6.3 g/dL — ABNORMAL LOW (ref 6.5–8.1)

## 2021-01-01 LAB — FUNGUS CULTURE WITH STAIN

## 2021-01-01 LAB — FUNGAL ORGANISM REFLEX

## 2021-01-01 LAB — FUNGUS CULTURE RESULT

## 2021-01-01 MED ORDER — METHYLPREDNISOLONE SODIUM SUCC 40 MG IJ SOLR
40.0000 mg | Freq: Once | INTRAMUSCULAR | Status: AC
  Administered 2021-01-01: 40 mg via INTRAVENOUS
  Filled 2021-01-01: qty 1

## 2021-01-01 MED ORDER — SODIUM CHLORIDE 0.9% FLUSH
10.0000 mL | Freq: Once | INTRAVENOUS | Status: AC
  Administered 2021-01-01: 10 mL

## 2021-01-01 MED ORDER — ACETAMINOPHEN 325 MG PO TABS
650.0000 mg | ORAL_TABLET | Freq: Once | ORAL | Status: AC
  Administered 2021-01-01: 650 mg via ORAL
  Filled 2021-01-01: qty 2

## 2021-01-01 MED ORDER — SODIUM CHLORIDE 0.9 % IV SOLN: Freq: Once | INTRAVENOUS | Status: AC

## 2021-01-01 MED ORDER — HEPARIN SOD (PORK) LOCK FLUSH 100 UNIT/ML IV SOLN
500.0000 [IU] | Freq: Once | INTRAVENOUS | Status: AC
  Administered 2021-01-01: 500 [IU]

## 2021-01-01 MED ORDER — IMMUNE GLOBULIN (HUMAN) 10 GM/100ML IJ SOLN
0.5000 g/kg | Freq: Once | INTRAMUSCULAR | Status: AC
  Administered 2021-01-01: 40 g via INTRAVENOUS
  Filled 2021-01-01: qty 400

## 2021-01-01 MED ORDER — DIPHENHYDRAMINE HCL 25 MG PO CAPS
25.0000 mg | ORAL_CAPSULE | Freq: Once | ORAL | Status: AC
  Administered 2021-01-01: 25 mg via ORAL
  Filled 2021-01-01: qty 1

## 2021-01-01 NOTE — Patient Instructions (Signed)
Immune Globulin Injection What is this medication? IMMUNE GLOBULIN (im MUNE GLOB yoo lin) helps to prevent or reduce the severity of certain infections in patients who are at risk. This medicine is collected from the pooled blood of many donors. It is used to treat immune systemproblems, thrombocytopenia, and Kawasaki syndrome. This medicine may be used for other purposes; ask your health care provider orpharmacist if you have questions. COMMON BRAND NAME(S): ASCENIV, Baygam, BIVIGAM, Carimune, Carimune NF, cutaquig, Cuvitru, Flebogamma, Flebogamma DIF, GamaSTAN, GamaSTAN S/D, Gamimune N, Gammagard, Gammagard S/D, Gammaked, Gammaplex, Gammar-P IV, Gamunex, Gamunex-C, Hizentra, Iveegam, Iveegam EN, Octagam, Panglobulin, Panglobulin NF, panzyga, Polygam S/D, Privigen, Sandoglobulin, Venoglobulin-S, Vigam,Vivaglobulin, Xembify What should I tell my care team before I take this medication? They need to know if you have any of these conditions: diabetes extremely low or no immune antibodies in the blood heart disease history of blood clots hyperprolinemia infection in the blood, sepsis kidney disease recently received or scheduled to receive a vaccination an unusual or allergic reaction to human immune globulin, albumin, maltose, sucrose, other medicines, foods, dyes, or preservatives pregnant or trying to get pregnant breast-feeding How should I use this medication? This medicine is for injection into a muscle or infusion into a vein or skin. It is usually given by a health care professional in a hospital or clinicsetting. In rare cases, some brands of this medicine might be given at home. You will be taught how to give this medicine. Use exactly as directed. Take your medicineat regular intervals. Do not take your medicine more often than directed. Talk to your pediatrician regarding the use of this medicine in children. Whilethis drug may be prescribed for selected conditions, precautions do  apply. Overdosage: If you think you have taken too much of this medicine contact apoison control center or emergency room at once. NOTE: This medicine is only for you. Do not share this medicine with others. What if I miss a dose? It is important not to miss your dose. Call your doctor or health care professional if you are unable to keep an appointment. If you give yourself the medicine and you miss a dose, take it as soon as you can. If it is almost timefor your next dose, take only that dose. Do not take double or extra doses. What may interact with this medication? aspirin and aspirin-like medicines cisplatin cyclosporine medicines for infection like acyclovir, adefovir, amphotericin B, bacitracin, cidofovir, foscarnet, ganciclovir, gentamicin, pentamidine, vancomycin NSAIDS, medicines for pain and inflammation, like ibuprofen or naproxen pamidronate vaccines zoledronic acid This list may not describe all possible interactions. Give your health care provider a list of all the medicines, herbs, non-prescription drugs, or dietary supplements you use. Also tell them if you smoke, drink alcohol, or use illegaldrugs. Some items may interact with your medicine. What should I watch for while using this medication? Your condition will be monitored carefully while you are receiving thismedicine. This medicine is made from pooled blood donations of many different people. It may be possible to pass an infection in this medicine. However, the donors are screened for infections and all products are tested for HIV and hepatitis. The medicine is treated to kill most or all bacteria and viruses. Talk to yourdoctor about the risks and benefits of this medicine. Do not have vaccinations for at least 14 days before, or until at least 3months after receiving this medicine. What side effects may I notice from receiving this medication? Side effects that you should report to your doctor   or health care  professionalas soon as possible: allergic reactions like skin rash, itching or hives, swelling of the face, lips, or tongue blue colored lips or skin breathing problems chest pain or tightness fever signs and symptoms of aseptic meningitis such as stiff neck; sensitivity to light; headache; drowsiness; fever; nausea; vomiting; rash signs and symptoms of a blood clot such as chest pain; shortness of breath; pain, swelling, or warmth in the leg signs and symptoms of hemolytic anemia such as fast heartbeat; tiredness; dark yellow or brown urine; or yellowing of the eyes or skin signs and symptoms of kidney injury like trouble passing urine or change in the amount of urine sudden weight gain swelling of the ankles, feet, hands Side effects that usually do not require medical attention (report to yourdoctor or health care professional if they continue or are bothersome): diarrhea flushing headache increased sweating joint pain muscle cramps muscle pain nausea pain, redness, or irritation at site where injected tiredness This list may not describe all possible side effects. Call your doctor for medical advice about side effects. You may report side effects to FDA at1-800-FDA-1088. Where should I keep my medication? Keep out of the reach of children. This drug is usually given in a hospital or clinic and will not be stored athome. In rare cases, some brands of this medicine may be given at home. If you are using this medicine at home, you will be instructed on how to store thismedicine. Throw away any unused medicine after the expiration date on the label. NOTE: This sheet is a summary. It may not cover all possible information. If you have questions about this medicine, talk to your doctor, pharmacist, orhealth care provider.  2022 Elsevier/Gold Standard (2018-09-05 12:51:14)  

## 2021-01-01 NOTE — Telephone Encounter (Signed)
Notified Patient of Prior Authorization approval for Lidocaine-Prilocaine 2.5% Cream. Medication is approved through 04/01/2021

## 2021-01-01 NOTE — Progress Notes (Signed)
Per Dr. Lindi Adie, proceed with IVIG with hypotension and monitor for symptoms.

## 2021-01-03 DIAGNOSIS — I77810 Thoracic aortic ectasia: Secondary | ICD-10-CM | POA: Insufficient documentation

## 2021-01-03 NOTE — Progress Notes (Addendum)
Cardiology Office Note:    Date:  01/04/2021   ID:  [name], DOB [birth year], MRN [id number]  PCP:  [name]., PA-C   [facility] Providers Cardiologist:  Freada Bergeron, MD     Referring MD: [name]., PA-C   Chief Complaint:  Follow-up for CAD, hypotension    Patient Profile:   Ryan Copeland is a 70 y.o. 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  He est with Dr. Johney Frame in 5/22.  He was admitted in 9/22 with septic shock in the setting of multifocal pneumonia.  He required NE.  His Midodrine was increased to 15 mg three times a day for low BP.  IVIG was administered prior to DC.  He reached out to Cardiology via [place] post DC and arranged f/u.   Of note, he was dx with [place] Lung CA.   He is not a candidate for surgery.   He is getting set up for radiation (SBRT).  He is not a candidate for chemotherapy.  He returns for f/u.  He is here today with his wife.  His breathing is stable.  He has not had chest pain.  He has not had syncope, orthopnea, leg edema.  He starts radiation soon.  He shows me a list of his blood pressures.  He has had several readings above 979, systolic.  He is concerned that his dose of Midodrine was doubled so suddenly.  ASSESSMENT & PLAN:   Chronic  hypotension He has been maintained on Midodrine for his low blood pressure.  His dose was increased after recent admission with septic shock in the setting of multifocal pneumonia requiring vasopressors.  He does have drops in his blood pressure during IVIG administration.  Otherwise, he has had fairly normal blood pressures.  Some of his pressures at home have also been elevated.  I have recommended that we slowly taper him down to Midodrine 10 mg 3 times a day over the next several days.  He knows to contact me if his pressures continue to remain above 892, systolic.  He can take 15 mg of Midodrine on the mornings of his infusion to avoid symptomatic hypotension.  Keep follow-up with Dr. Johney Frame in February.  CAD (coronary artery disease) History of non-STEMI in 2011 treated with stent to the LAD.  Cardiac catheterization 2016 with patent LAD stent and chronically occluded RCA with left-to-right collaterals.  He is currently doing well without anginal symptoms.  Continue rosuvastatin 5 mg daily.  He is not currently on aspirin.  I reviewed notes from his admission to [facility] in August.  A CT scan did show hemoperitoneum and he was treated with PRBCs for worsening anemia.  Recommendation at that time was to hold aspirin until he was seen  in follow-up.  I think he should be able to resume this.  I will reach out to him to see if he can resume it.    Addendum: In review of his chart, he has had fairly low platelet counts in the past.  In light of the need for blood transfusion as well as hemoperitoneum while hospitalized in August, I suspect the risks of continuing aspirin outweigh the benefits at this point.  I have asked him to remain off of aspirin  Aortic root dilation (HCC) 39 mm on echocardiogram in 11/21.  Consider f/u echocardiogram in 6-12 mos.  Hypogammaglobulinemia, acquired Continue f/u with Heme/Onc for IVIG infusions.    Hyperlipidemia Continue Rosuvastatin 5 mg once  daily.  Arrange fasting CMET, Lipids prior to next OV.            Dispo:  Return in about 3 months (around 04/06/2021) for Routine follow up 3 months with Dr. Johney Frame. .    Prior CV studies: Echocardiogram 12/17/19 EF 60-65, no RWMA, Gr 1 DD, normal RVSF, RVSP 42.5, mod LAE, mild MR, mild dilation of aortic root (39 mm)  Cardiac catheterization 09/26/14 LM normal LAD stent patent LCx patent  RCA 100 CTO with L-R collaterals EF 55-65      Past Medical History:  Diagnosis Date   Anxiety 01/30/2014   Back injury    lower disc   CAD (coronary artery disease)    CLL (chronic lymphocytic leukemia) ([place]) 03/18/2011   COPD (chronic obstructive pulmonary disease) (HCC)    DM type 2 (diabetes mellitus, type 2) (HCC)    ECRB (extensor carpi radialis brevis) tenosynovitis    Foley catheter in place 11/2019   last changed 04-14-2020   GERD (gastroesophageal reflux disease)    takes Nexium if needed   History of blood transfusion 2015   Hyperlipidemia    Insomnia    Long-term current use of intravenous immunoglobulin (IVIG)    q month ivig   MI, acute, non ST segment elevation ([place]) 06/28/2009   with stenting of the LAD   Neuromuscular disorder ([place])    peripheral neuropathy both all toes   Pneumonia 11/2019   10/2020   PVD (peripheral vascular disease) (HCC)    Thrombocytopenia (HCC)    Tobacco abuse    Current Medications: Current Meds  Medication Sig   Acalabrutinib Maleate (CALQUENCE) 100 MG TABS Take 1 tablet (100 mg) by mouth 2 (two) times daily.   acetaminophen (TYLENOL) 325 MG tablet Take 2 tablets (650 mg total) by mouth every 6 (six) hours as needed for moderate pain or fever.   acyclovir (ZOVIRAX) 400 MG tablet TAKE 1 TABLET BY MOUTH TWICE A DAY   allopurinol (ZYLOPRIM) 300 MG tablet Take 1 tablet (300 mg total) by mouth daily.   ALPRAZolam (XANAX) 0.5 MG tablet Take 0.5 mg by mouth at bedtime as needed for anxiety.   b complex vitamins tablet Take 1 tablet by mouth  daily.    benzonatate (TESSALON) 100 MG capsule TAKE TWO CAPSULES BY MOUTH THREE TIMES A DAY AS NEEDED   Cholecalciferol 25 MCG (1000 UT) tablet Take 1,000 Units by mouth daily.    diphenoxylate-atropine (LOMOTIL) 2.5-0.025 MG tablet TAKE 1 TABLET BY MOUTH 4 (FOUR) TIMES DAILY AS NEEDED FOR DIARRHEA OR LOOSE STOOLS.   famotidine (PEPCID) 20 MG tablet TAKE 1 TABLET BY MOUTH TWICE A DAY   furosemide (LASIX) 20 MG tablet Take 20 mg by mouth daily.   HYDROcodone bit-homatropine (HYCODAN) 5-1.5 MG/5ML syrup  Take 5 mLs by mouth every 6 (six) hours as needed for cough.   HYDROcodone-acetaminophen (NORCO) 10-325 MG tablet Take 1 tablet by mouth every 6 (six) hours as needed for moderate pain.   lidocaine-prilocaine (EMLA) cream Apply 1 application topically as needed (prior chemo/IBIG).   loperamide (IMODIUM) 2 MG capsule Take 2 capsules (4 mg total) by mouth 4 (four) times daily.   meclizine (ANTIVERT) 12.5 MG tablet Take 12.5 mg by mouth 3 (three) times daily as needed for dizziness.   metFORMIN (GLUCOPHAGE-XR) 500 MG 24 hr tablet Take 500 mg by mouth every evening.   midodrine (PROAMATINE) 5 MG tablet Take 3 tablets (15 mg total) by mouth 3 (three) times daily with meals.   montelukast (SINGULAIR) 10 MG tablet TAKE 1 TABLET BY MOUTH EVERY DAY   nitroGLYCERIN (NITROSTAT) 0.4 MG SL tablet PLACE 1 TABLET UNDER THE TONGUE EVERY 5 MINUTES X 3 DOSES AS NEEDED FOR CHEST PAIN *MAX 3 DOSES*   oxybutynin (DITROPAN) 5 MG tablet Take 5 mg by mouth 2 (two) times daily as needed.   pregabalin (LYRICA) 200 MG capsule TAKE 1 CAPSULE (200 MG TOTAL) BY MOUTH 3 (THREE) TIMES DAILY.   rosuvastatin (CRESTOR) 5 MG tablet Take 1 tablet (5 mg total) by mouth daily.   sildenafil (VIAGRA) 100 MG tablet Take 0.5 tablets (50 mg total) by mouth as needed for erectile dysfunction (30 mintues prior to sexual intercourse).   tamsulosin (FLOMAX) 0.4 MG CAPS capsule Take 1 capsule (0.4 mg total) by mouth daily.   zolpidem (AMBIEN) 10  MG tablet Take 10 mg by mouth at bedtime as needed for sleep.    Allergies:   Immune globulin and Codeine   Social History   Tobacco Use   Smoking status: Former    Packs/day: 1.00    Years: 44.00    Pack years: 44.00    Types: Cigarettes    Quit date: 04/23/2016    Years since quitting: 4.7   Smokeless tobacco: Never  Vaping Use   Vaping Use: Never used  Substance Use Topics   Alcohol use: Not Currently    Alcohol/week: 0.0 standard drinks   Drug use: No    Family Hx: The patient's family history includes Cancer in his mother; Heart disease in his father; Heart failure (age of onset: 46) in his father; Lung cancer (age of onset: 62) in his mother.  ROS see HPI  EKGs/Labs/Other Test Reviewed:    EKG:  EKG is not ordered today.  The ekg ordered today demonstrates n/a  Recent Labs: 02/15/2020: B Natriuretic Peptide 146.4 11/06/2020: Magnesium 1.7 01/01/2021: ALT 39; BUN 17; Creatinine, Ser 0.83; Hemoglobin 11.7; Platelets 104; Potassium 4.6; Sodium 139   Recent Lipid Panel Lab Results  Component Value Date/Time   CHOL 79 (L) 06/19/2019 08:48 AM   TRIG 44 06/19/2019 08:48 AM   HDL 34 (L) 06/19/2019 08:48 AM   LDLCALC 33 06/19/2019 08:48 AM   LDLDIRECT 117.6 08/22/2011 09:04 AM     Risk Assessment/Calculations:          Physical Exam:    VS:  BP (!) 122/56   Pulse 62   Ht 6' (1.829 m)   Wt 168 lb 9.6 oz (76.5 kg)   SpO2 96%   BMI 22.87 kg/m     Wt Readings from Last 3 Encounters:  01/04/21 168 lb 9.6 oz (76.5 kg)  01/01/21 169 lb (76.7 kg)  12/17/20 173 lb (78.5 kg)    Constitutional:  Appearance: Healthy appearance. Not in distress.  Neck:     Vascular: No JVR. JVD normal.  Pulmonary:     Effort: Pulmonary effort is normal.     Breath sounds: No wheezing. No rales.  Cardiovascular:     Normal rate. Regular rhythm. Normal S1. Normal S2.      Murmurs: There is no murmur.  Edema:    Peripheral edema absent.  Abdominal:     Palpations: Abdomen  is soft. There is no hepatomegaly.  Skin:    General: Skin is warm and dry.  Neurological:     General: No focal deficit present.     Mental Status: Alert and oriented to person, place and time.     Cranial Nerves: Cranial nerves are intact.       Medication Adjustments/Labs and Tests Ordered: Current medicines are reviewed at length with the patient today.  Concerns regarding medicines are outlined above.  Tests Ordered: Orders Placed This Encounter  Procedures   Comp Met (CMET)   Lipid Profile    Medication Changes: No orders of the defined types were placed in this encounter.  Signed, Richardson Dopp, PA-C  01/04/2021 5:40 PM    [facility] [place], [place], [place]  [address] Phone: [phone]; Fax: [phone]

## 2021-01-04 ENCOUNTER — Encounter: Payer: Self-pay | Admitting: Hematology and Oncology

## 2021-01-04 ENCOUNTER — Other Ambulatory Visit: Payer: Self-pay

## 2021-01-04 ENCOUNTER — Encounter: Payer: Self-pay | Admitting: Physician Assistant

## 2021-01-04 ENCOUNTER — Ambulatory Visit (INDEPENDENT_AMBULATORY_CARE_PROVIDER_SITE_OTHER): Payer: Medicare Other | Admitting: Physician Assistant

## 2021-01-04 VITALS — BP 122/56 | HR 62 | Ht 72.0 in | Wt 168.6 lb

## 2021-01-04 DIAGNOSIS — I25119 Atherosclerotic heart disease of native coronary artery with unspecified angina pectoris: Secondary | ICD-10-CM | POA: Diagnosis not present

## 2021-01-04 DIAGNOSIS — D801 Nonfamilial hypogammaglobulinemia: Secondary | ICD-10-CM

## 2021-01-04 DIAGNOSIS — I259 Chronic ischemic heart disease, unspecified: Secondary | ICD-10-CM | POA: Diagnosis not present

## 2021-01-04 DIAGNOSIS — I739 Peripheral vascular disease, unspecified: Secondary | ICD-10-CM

## 2021-01-04 DIAGNOSIS — I9589 Other hypotension: Secondary | ICD-10-CM | POA: Diagnosis not present

## 2021-01-04 DIAGNOSIS — Z510 Encounter for antineoplastic radiation therapy: Secondary | ICD-10-CM | POA: Diagnosis not present

## 2021-01-04 DIAGNOSIS — E782 Mixed hyperlipidemia: Secondary | ICD-10-CM

## 2021-01-04 DIAGNOSIS — I77810 Thoracic aortic ectasia: Secondary | ICD-10-CM | POA: Diagnosis not present

## 2021-01-04 NOTE — Patient Instructions (Signed)
Medication Instructions:  You can decrease your Midodrine as follows: Take Midodrine 15 mg / 15 mg / 10 mg for 2 days Then, take Midodrine 15 mg / 10 mg / 10 mg for 2 days Then, decrease Midodrine to 10 mg three times a day   On your IvIg days, you can take Midodrine 15 mg that AM to prevent your pressure from getting too low  *If you need a refill on your cardiac medications before your next appointment, please call your pharmacy*   Lab Work: 1 week before next visit - Fasting CMET, Lipids  If you have labs (blood work) drawn today and your tests are completely normal, you will receive your results only by: [facility] (if you have MyChart) OR A paper copy in the mail If you have any lab test that is abnormal or we need to change your treatment, we will call you to review the results.   Follow-Up: At [facility], you and your health needs are our priority.  As part of our continuing mission to provide you with exceptional heart care, we have created designated Provider Care Teams.  These Care Teams include your primary Cardiologist (physician) and Advanced Practice Providers (APPs -  Physician Assistants and Nurse Practitioners) who all work together to provide you with the care you need, when you need it.  We recommend signing up for the patient portal called "MyChart".  Sign up information is provided on this After Visit Summary.  MyChart is used to connect with patients for Virtual Visits (Telemedicine).  Patients are able to view lab/test results, encounter notes, upcoming appointments, etc.  Non-urgent messages can be sent to your provider as well.   To learn more about what you can do with MyChart, go to [email or web address].    Your next appointment:   03/17/2021  The format for your next appointment:   In Person  Provider:   Freada Bergeron, MD     Other Instructions If your blood pressure remains above 120 (top number) despite the reduction in Midodrine  dose, let us know.

## 2021-01-04 NOTE — Assessment & Plan Note (Signed)
39 mm on echocardiogram in 11/21.  Consider f/u echocardiogram in 6-12 mos.

## 2021-01-04 NOTE — Assessment & Plan Note (Signed)
Continue f/u with Heme/Onc for IVIG infusions.

## 2021-01-04 NOTE — Assessment & Plan Note (Addendum)
History of non-STEMI in 2011 treated with stent to the LAD.  Cardiac catheterization 2016 with patent LAD stent and chronically occluded RCA with left-to-right collaterals.  He is currently doing well without anginal symptoms.  Continue rosuvastatin 5 mg daily.  He is not currently on aspirin.  I reviewed notes from his admission to [facility] in August.  A CT scan did show hemoperitoneum and he was treated with PRBCs for worsening anemia.  Recommendation at that time was to hold aspirin until he was seen in follow-up.  I think he should be able to resume this.  I will reach out to him to see if he can resume it.    Addendum: In review of his chart, he has had fairly low platelet counts in the past.  In light of the need for blood transfusion as well as hemoperitoneum while hospitalized in August, I suspect the risks of continuing aspirin outweigh the benefits at this point.  I have asked him to remain off of aspirin

## 2021-01-04 NOTE — Assessment & Plan Note (Signed)
Continue Rosuvastatin 5 mg once daily.  Arrange fasting CMET, Lipids prior to next OV.

## 2021-01-04 NOTE — Assessment & Plan Note (Signed)
He has been maintained on Midodrine for his low blood pressure.  His dose was increased after recent admission with septic shock in the setting of multifocal pneumonia requiring vasopressors.  He does have drops in his blood pressure during IVIG administration.  Otherwise, he has had fairly normal blood pressures.  Some of his pressures at home have also been elevated.  I have recommended that we slowly taper him down to Midodrine 10 mg 3 times a day over the next several days.  He knows to contact me if his pressures continue to remain above 150, systolic.  He can take 15 mg of Midodrine on the mornings of his infusion to avoid symptomatic hypotension.  Keep follow-up with Dr. Johney Frame in February.

## 2021-01-05 ENCOUNTER — Encounter: Payer: Self-pay | Admitting: Physical Therapy

## 2021-01-12 ENCOUNTER — Other Ambulatory Visit: Payer: Self-pay

## 2021-01-12 ENCOUNTER — Ambulatory Visit
Admission: RE | Admit: 2021-01-12 | Discharge: 2021-01-12 | Disposition: A | Discharge status: Home / Self Care | Payer: Medicare Other | Source: Ambulatory Visit | Attending: Radiation Oncology | Admitting: Radiation Oncology

## 2021-01-12 ENCOUNTER — Encounter: Payer: Self-pay | Admitting: Physical Therapy

## 2021-01-12 DIAGNOSIS — Z510 Encounter for antineoplastic radiation therapy: Secondary | ICD-10-CM | POA: Diagnosis not present

## 2021-01-13 ENCOUNTER — Ambulatory Visit: Payer: Medicare Other | Admitting: Radiation Oncology

## 2021-01-13 ENCOUNTER — Ambulatory Visit (INDEPENDENT_AMBULATORY_CARE_PROVIDER_SITE_OTHER): Payer: Medicare Other | Admitting: Pulmonary Disease

## 2021-01-13 ENCOUNTER — Encounter: Payer: Self-pay | Admitting: Pulmonary Disease

## 2021-01-13 ENCOUNTER — Other Ambulatory Visit: Payer: Self-pay | Admitting: Hematology and Oncology

## 2021-01-13 VITALS — BP 108/52 | HR 61 | Temp 97.6°F | Ht 72.0 in | Wt 169.4 lb

## 2021-01-13 DIAGNOSIS — R599 Enlarged lymph nodes, unspecified: Secondary | ICD-10-CM

## 2021-01-13 DIAGNOSIS — R053 Chronic cough: Secondary | ICD-10-CM | POA: Diagnosis not present

## 2021-01-13 DIAGNOSIS — D801 Nonfamilial hypogammaglobulinemia: Secondary | ICD-10-CM

## 2021-01-13 DIAGNOSIS — R0981 Nasal congestion: Secondary | ICD-10-CM | POA: Diagnosis not present

## 2021-01-13 DIAGNOSIS — C9110 Chronic lymphocytic leukemia of B-cell type not having achieved remission: Secondary | ICD-10-CM

## 2021-01-13 DIAGNOSIS — C3492 Malignant neoplasm of unspecified part of left bronchus or lung: Secondary | ICD-10-CM

## 2021-01-13 DIAGNOSIS — J411 Mucopurulent chronic bronchitis: Secondary | ICD-10-CM | POA: Diagnosis not present

## 2021-01-13 MED ORDER — HYDROCODONE BIT-HOMATROP MBR 5-1.5 MG/5ML PO SOLN: 5.0000 mL | Freq: Four times a day (QID) | ORAL | 0 refills | Status: DC | PRN

## 2021-01-13 NOTE — Progress Notes (Signed)
Synopsis: Referred in October 2022 for lung nodule by [name]., PA-C  Subjective:   PATIENT ID: Ryan Copeland GENDER: male DOB: 04-10-1950, MRN: [id number]  Chief Complaint  Patient presents with   Follow-up    Follow up     70 yo PMH, CAD, CLL, COPD, HLD. Follows with oncology. Originally referred to see me for lung nodule. He had to reschedule due to being admitted to [facility]. Related to UTI and sepsis and PNA.  From a respiratory standpoint patient is doing better.  Unfortunately was recently hospitalized in the middle of September for community-acquired pneumonia.  He has office appointments have been rescheduled several times and now he is able to see [place] and is doing better.  OV 01/13/2021: here today for follow up regarding.  From respiratory standpoint patient is doing well.  Does have nocturnal cough at times.  Has recurrent chronic sinus congestion.  Would like to see ENT.   Past Medical History:  Diagnosis Date   Anxiety 01/30/2014   Back injury    lower disc   CAD (coronary artery disease)    CLL (chronic lymphocytic leukemia) ([place]) 03/18/2011   COPD (chronic obstructive pulmonary disease) (HCC)    DM type 2 (diabetes mellitus, type 2) (HCC)    ECRB (extensor carpi radialis brevis) tenosynovitis    Foley catheter in place 11/2019   last changed 04-14-2020   GERD (gastroesophageal reflux disease)    takes Nexium if needed   History of blood transfusion 2015   Hyperlipidemia    Insomnia    Long-term current use of intravenous immunoglobulin (IVIG)    q month ivig   MI, acute, non ST segment elevation ([place]) 06/28/2009   with stenting of the LAD   Neuromuscular disorder ([place])    peripheral neuropathy both all toes   Pneumonia 11/2019   10/2020   PVD (peripheral vascular disease) (HCC)    Thrombocytopenia ([place])    Tobacco abuse      Family History  Problem Relation Age of Onset   Lung cancer Mother 57   Cancer Mother        lung   Heart failure  Father 93   Heart disease Father      Past Surgical History:  Procedure Laterality Date   ADENOIDECTOMY  1955   BRONCHIAL BIOPSY  12/08/2020   Procedure: BRONCHIAL BIOPSIES;  Surgeon: [name], DO;  Location: [facility];  Service: Pulmonary;;   BRONCHIAL BRUSHINGS  12/08/2020   Procedure: BRONCHIAL BRUSHINGS;  Surgeon: [name], DO;  Location: [facility] [facility];  Service: Pulmonary;;   BRONCHIAL NEEDLE ASPIRATION BIOPSY  12/08/2020   Procedure: BRONCHIAL NEEDLE ASPIRATION BIOPSIES;  Surgeon: [name], DO;  Location: [place];  Service: Pulmonary;;   CARDIAC CATHETERIZATION     CARDIAC CATHETERIZATION N/A 09/26/2014   Procedure: Left Heart Cath and Coronary Angiography;  Surgeon: Peter M Martinique, MD;  Location: [facility];  Service: Cardiovascular;  Laterality: N/A;   carpel tunnel release Left 04-1989   carpel tunnel release  Right 01-1989   CHOLECYSTECTOMY  2007   [place]   CORONARY STENT PLACEMENT  06/2009   stent to lad   CYSTOSCOPY N/A 04/20/2020   Procedure: [place];  Surgeon: Janith Lima, MD;  Location[facility];  Service: Urology;  Laterality: N/A;  ONLY NEEDS 30 MIN   femoral stents  09/2014   iliac stent   FIDUCIAL MARKER PLACEMENT  12/08/2020  Procedure: FIDUCIAL MARKER PLACEMENT;  Surgeon: [name], DO;  Location: [facility];  Service: Pulmonary;;   IR CV LINE INJECTION  08/18/2017   IR CV LINE INJECTION  09/01/2017   IR CV LINE INJECTION  02/02/2018   LATERAL EPICONDYLE RELEASE Left 02/12/2014   Procedure: LEFT ELBOW DEBRIDEMENT WITH TENDON REPAIR ;  Surgeon: Lorn Junes, MD;  Location: [place];  Service: Orthopedics;  Laterality: Left;   LEFT CAI STENT/PTA AND POPLITEAL ARTERY/TIBIAL THROMBECTOMY      LEFT HEART CATHETERIZATION WITH CORONARY ANGIOGRAM N/A 08/26/2011   Procedure: LEFT HEART CATHETERIZATION WITH CORONARY ANGIOGRAM;  Surgeon: Peter M Martinique, MD;  Location: [facility];   Service: Cardiovascular;  Laterality: N/A;   PERIPHERAL VASCULAR CATHETERIZATION N/A 01/01/2015   Procedure: Abdominal Aortogram;  Surgeon: Conrad Sangamon, MD;  Location: [place] [facility];  Service: Cardiovascular;  Laterality: N/A;   port a cath insertion  2014   right    TARSAL TUNNEL RELEASE Bilateral 08-2007   VIDEO BRONCHOSCOPY WITH ENDOBRONCHIAL NAVIGATION Left 12/08/2020   Procedure: VIDEO BRONCHOSCOPY WITH ENDOBRONCHIAL NAVIGATION;  Surgeon: [name], DO;  Location: [place];  Service: Pulmonary;  Laterality: Left;  ION w/ fiducial placement   VIDEO BRONCHOSCOPY WITH RADIAL ENDOBRONCHIAL ULTRASOUND  12/08/2020   Procedure: RADIAL ENDOBRONCHIAL ULTRASOUND;  Surgeon: [name], DO;  Location: [facility];  Service: Pulmonary;;    Social History   Socioeconomic History   Marital status: Married    Spouse name: [name]   Number of children: 1   Years of education: [employer] education level: Not on file  Occupational History   Occupation: retired    Fish farm manager: [employer]    Comment: [employer]  Tobacco Use   Smoking status: Former    Packs/day: 1.00    Years: 44.00    Pack years: 44.00    Types: Cigarettes    Quit date: 04/23/2016    Years since quitting: 4.7   Smokeless tobacco: Never  Vaping Use   Vaping Use: Never used  Substance and Sexual Activity   Alcohol use: Not Currently    Alcohol/week: 0.0 standard drinks   Drug use: No   Sexual activity: Yes  Other Topics Concern   Not on file  Social History Narrative   Patient lives at home with wife.   Caffeine Use: 15 cups of caffeine weekly   Social Determinants of Health   Financial Resource Strain: Not on file  Food Insecurity: Not on file  Transportation Needs: Not on file  Physical Activity: Not on file  Stress: Not on file  Social Connections: Not on file  Intimate Partner Violence: Not on file     Allergies  Allergen Reactions   Immune Globulin Hives and Rash     Pt reports hives/rash on body with IV Privigen. He was changed to IV Gamunex-C and had many infusions without adverse side effects.    Codeine Hives    Pt states he can take a few, more reaction with extended doses.     Outpatient Medications Prior to Visit  Medication Sig Dispense Refill   Acalabrutinib Maleate (CALQUENCE) 100 MG TABS Take 1 tablet (100 mg) by mouth 2 (two) times daily. 60 tablet 1   acetaminophen (TYLENOL) 325 MG tablet Take 2 tablets (650 mg total) by mouth every 6 (six) hours as needed for moderate pain or fever. 30 tablet 0   acyclovir (ZOVIRAX) 400 MG tablet TAKE 1 TABLET BY MOUTH  TWICE A DAY 180 tablet 1   allopurinol (ZYLOPRIM) 300 MG tablet Take 1 tablet (300 mg total) by mouth daily. 90 tablet 1   ALPRAZolam (XANAX) 0.5 MG tablet Take 0.5 mg by mouth at bedtime as needed for anxiety.     b complex vitamins tablet Take 1 tablet by mouth daily.      benzonatate (TESSALON) 100 MG capsule TAKE TWO CAPSULES BY MOUTH THREE TIMES A DAY AS NEEDED 90 capsule 1   Cholecalciferol 25 MCG (1000 UT) tablet Take 1,000 Units by mouth daily.      diphenoxylate-atropine (LOMOTIL) 2.5-0.025 MG tablet TAKE 1 TABLET BY MOUTH 4 (FOUR) TIMES DAILY AS NEEDED FOR DIARRHEA OR LOOSE STOOLS. 60 tablet 1   famotidine (PEPCID) 20 MG tablet TAKE 1 TABLET BY MOUTH TWICE A DAY 180 tablet 1   furosemide (LASIX) 20 MG tablet Take 20 mg by mouth daily.     HYDROcodone bit-homatropine (HYCODAN) 5-1.5 MG/5ML syrup Take 5 mLs by mouth every 6 (six) hours as needed for cough. 240 mL 0   HYDROcodone-acetaminophen (NORCO) 10-325 MG tablet Take 1 tablet by mouth every 6 (six) hours as needed for moderate pain.     lidocaine-prilocaine (EMLA) cream Apply 1 application topically as needed (prior chemo/IBIG). 30 g 9   loperamide (IMODIUM) 2 MG capsule Take 2 capsules (4 mg total) by mouth 4 (four) times daily. 120 capsule 9   meclizine (ANTIVERT) 12.5 MG tablet Take 12.5 mg by mouth 3 (three) times daily as  needed for dizziness.     metFORMIN (GLUCOPHAGE-XR) 500 MG 24 hr tablet Take 500 mg by mouth every evening.     midodrine (PROAMATINE) 5 MG tablet Take 3 tablets (15 mg total) by mouth 3 (three) times daily with meals. 270 tablet 2   montelukast (SINGULAIR) 10 MG tablet TAKE 1 TABLET BY MOUTH EVERY DAY 90 tablet 1   nitroGLYCERIN (NITROSTAT) 0.4 MG SL tablet PLACE 1 TABLET UNDER THE TONGUE EVERY 5 MINUTES X 3 DOSES AS NEEDED FOR CHEST PAIN *MAX 3 DOSES* 25 tablet 7   oxybutynin (DITROPAN) 5 MG tablet Take 5 mg by mouth 2 (two) times daily as needed.     pregabalin (LYRICA) 200 MG capsule TAKE 1 CAPSULE (200 MG TOTAL) BY MOUTH 3 (THREE) TIMES DAILY. 270 capsule 1   rosuvastatin (CRESTOR) 5 MG tablet Take 1 tablet (5 mg total) by mouth daily. 90 tablet 3   sildenafil (VIAGRA) 100 MG tablet Take 0.5 tablets (50 mg total) by mouth as needed for erectile dysfunction (30 mintues prior to sexual intercourse). 10 tablet 3   tamsulosin (FLOMAX) 0.4 MG CAPS capsule Take 1 capsule (0.4 mg total) by mouth daily. 30 capsule 0   zolpidem (AMBIEN) 10 MG tablet Take 10 mg by mouth at bedtime as needed for sleep.     Facility-Administered Medications Prior to Visit  Medication Dose Route Frequency Provider Last Rate Last Admin   0.9 %  sodium chloride infusion   Intravenous Continuous Alvy Bimler, Ni, MD 50 mL/hr at 03/07/14 1005 New Bag at 03/07/14 1005   sodium chloride 0.9 % injection 10 mL  10 mL Intracatheter PRN Marcy Panning, MD   10 mL at 08/22/12 1721    Review of Systems  Constitutional:  Negative for chills, fever, malaise/fatigue and weight loss.  HENT:  Negative for hearing loss, sore throat and tinnitus.   Eyes:  Negative for blurred vision and double vision.  Respiratory:  Positive for cough. Negative for hemoptysis, sputum  production, shortness of breath, wheezing and stridor.   Cardiovascular:  Negative for chest pain, palpitations, orthopnea, leg swelling and PND.  Gastrointestinal:  Negative  for abdominal pain, constipation, diarrhea, heartburn, nausea and vomiting.  Genitourinary:  Negative for dysuria, hematuria and urgency.  Musculoskeletal:  Negative for joint pain and myalgias.  Skin:  Negative for itching and rash.  Neurological:  Negative for dizziness, tingling, weakness and headaches.  Endo/Heme/Allergies:  Negative for environmental allergies. Does not bruise/bleed easily.  Psychiatric/Behavioral:  Negative for depression. The patient is not nervous/anxious and does not have insomnia.   All other systems reviewed and are negative.   Objective:  Physical Exam Vitals reviewed.  Constitutional:      General: He is not in acute distress.    Appearance: He is well-developed. He is obese.  HENT:     Head: Normocephalic and atraumatic.  Eyes:     General: No scleral icterus.    Conjunctiva/sclera: Conjunctivae normal.     Pupils: Pupils are equal, round, and reactive to light.  Neck:     Vascular: No JVD.     Trachea: No tracheal deviation.  Cardiovascular:     Rate and Rhythm: Normal rate and regular rhythm.     Heart sounds: Normal heart sounds. No murmur heard. Pulmonary:     Effort: Pulmonary effort is normal. No tachypnea, accessory muscle usage or respiratory distress.     Breath sounds: No stridor. No wheezing, rhonchi or rales.  Abdominal:     General: Bowel sounds are normal. There is no distension.     Palpations: Abdomen is soft.     Tenderness: There is no abdominal tenderness.  Musculoskeletal:        General: No tenderness.     Cervical back: Neck supple.  Lymphadenopathy:     Cervical: No cervical adenopathy.  Skin:    General: Skin is warm and dry.     Capillary Refill: Capillary refill takes less than 2 seconds.     Findings: No rash.  Neurological:     Mental Status: He is alert and oriented to person, place, and time.  Psychiatric:        Behavior: Behavior normal.     Vitals:   01/13/21 1101  BP: (!) 108/52  Pulse: 61  Temp:  97.6 F (36.4 C)  TempSrc: Oral  SpO2: 98%  Weight: 169 lb 6.4 oz (76.8 kg)  Height: 6' (1.829 m)   98% on RA BMI Readings from Last 3 Encounters:  01/13/21 22.97 kg/m  01/04/21 22.87 kg/m  01/01/21 22.92 kg/m   Wt Readings from Last 3 Encounters:  01/13/21 169 lb 6.4 oz (76.8 kg)  01/04/21 168 lb 9.6 oz (76.5 kg)  01/01/21 169 lb (76.7 kg)     CBC    Component Value Date/Time   WBC 18.5 (H) 01/01/2021 0746   RBC 4.10 (L) 01/01/2021 0746   HGB 11.7 (L) 01/01/2021 0746   HGB 13.5 05/18/2018 1122   HGB 13.6 01/26/2017 0940   HCT 36.6 (L) 01/01/2021 0746   HCT 39.5 01/26/2017 0940   PLT 104 (L) 01/01/2021 0746   PLT 71 (L) 05/18/2018 1122   PLT 84 (L) 01/26/2017 0940   MCV 89.3 01/01/2021 0746   MCV 98.0 01/26/2017 0940   MCH 28.5 01/01/2021 0746   MCHC 32.0 01/01/2021 0746   RDW 17.1 (H) 01/01/2021 0746   RDW 14.9 (H) 01/26/2017 0940   LYMPHSABS 15.8 (H) 01/01/2021 0746   LYMPHSABS 0.4 (L) 01/26/2017  0940   MONOABS 0.4 01/01/2021 0746   MONOABS 0.4 01/26/2017 0940   EOSABS 0.1 01/01/2021 0746   EOSABS 0.1 01/26/2017 0940   BASOSABS 0.0 01/01/2021 0746   BASOSABS 0.0 01/26/2017 0940     Chest Imaging: 11/02/2020 CT chest abdomen pelvis: Lower lobe pneumonia dense consolidation costs, also has left pulmonary nodule which is slowly began enlarged.  He had nuclear medicine pet imaging back in August which reveals PET avid uptake in the nodule concerning for malignancy. The patient's images have been independently reviewed by me.    Pulmonary Functions Testing Results: PFT Results Latest Ref Rng & Units 06/27/2019  FVC-Pre L 3.62  FVC-Predicted Pre % 77  FVC-Post L 3.70  FVC-Predicted Post % 79  Pre FEV1/FVC % % 75  Post FEV1/FCV % % 76  FEV1-Pre L 2.72  FEV1-Predicted Pre % 78  FEV1-Post L 2.82    FeNO:   Pathology:   Echocardiogram:   Heart Catheterization:     Assessment & Plan:     ICD-10-CM   1. Non-small cell cancer of left lung (HCC)   C34.92     2. Sinus congestion  R09.81 Ambulatory referral to ENT    3. Chronic cough  R05.3     4. Mucopurulent chronic bronchitis ([place])  J41.1     5. 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 CLL (chronic lymphocytic leukemia) ([place])  C91.10       Discussion:  This is a 70 year old gentleman, tobacco abuse history, left lower lobe PET avid lung nodule, recent diagnosis of a stage I non-small cell lung cancer.  He also has a small amount of lymph nodes within the mediastinum which were negative on PET scan.  Recommended for SBRT treatments x3 with radiation oncology.  He completed his first treatment yesterday.  Plan: From respiratory standpoint doing okay.  Chronic sinus drainage and chronic cough.  He has benefited from the cough medicine at nighttime to help him sleep. I am okay with refilling this again. Referral placed to ENT for evaluation of his chronic sinus complaints. Return to clinic to see [place] in 6 months or as needed.    Current Outpatient Medications:    Acalabrutinib Maleate (CALQUENCE) 100 MG TABS, Take 1 tablet (100 mg) by mouth 2 (two) times daily., Disp: 60 tablet, Rfl: 1   acetaminophen (TYLENOL) 325 MG tablet, Take 2 tablets (650 mg total) by mouth every 6 (six) hours as needed for moderate pain or fever., Disp: 30 tablet, Rfl: 0   acyclovir (ZOVIRAX) 400 MG tablet, TAKE 1 TABLET BY MOUTH TWICE A DAY, Disp: 180 tablet, Rfl: 1   allopurinol (ZYLOPRIM) 300 MG tablet, Take 1 tablet (300 mg total) by mouth daily., Disp: 90 tablet, Rfl: 1   ALPRAZolam (XANAX) 0.5 MG tablet, Take 0.5 mg by mouth at bedtime as needed for anxiety., Disp: , Rfl:    b complex vitamins tablet, Take 1 tablet by mouth daily. , Disp: , Rfl:    benzonatate (TESSALON) 100 MG capsule, TAKE TWO CAPSULES BY MOUTH THREE TIMES A DAY AS NEEDED, Disp: 90 capsule, Rfl: 1   Cholecalciferol 25 MCG (1000 UT) tablet, Take 1,000 Units by mouth daily. , Disp: , Rfl:     diphenoxylate-atropine (LOMOTIL) 2.5-0.025 MG tablet, TAKE 1 TABLET BY MOUTH 4 (FOUR) TIMES DAILY AS NEEDED FOR DIARRHEA OR LOOSE STOOLS., Disp: 60 tablet, Rfl: 1   famotidine (PEPCID) 20 MG tablet,  TAKE 1 TABLET BY MOUTH TWICE A DAY, Disp: 180 tablet, Rfl: 1   furosemide (LASIX) 20 MG tablet, Take 20 mg by mouth daily., Disp: , Rfl:    HYDROcodone bit-homatropine (HYCODAN) 5-1.5 MG/5ML syrup, Take 5 mLs by mouth every 6 (six) hours as needed for cough., Disp: 240 mL, Rfl: 0   HYDROcodone-acetaminophen (NORCO) 10-325 MG tablet, Take 1 tablet by mouth every 6 (six) hours as needed for moderate pain., Disp: , Rfl:    lidocaine-prilocaine (EMLA) cream, Apply 1 application topically as needed (prior chemo/IBIG)., Disp: 30 g, Rfl: 9   loperamide (IMODIUM) 2 MG capsule, Take 2 capsules (4 mg total) by mouth 4 (four) times daily., Disp: 120 capsule, Rfl: 9   meclizine (ANTIVERT) 12.5 MG tablet, Take 12.5 mg by mouth 3 (three) times daily as needed for dizziness., Disp: , Rfl:    metFORMIN (GLUCOPHAGE-XR) 500 MG 24 hr tablet, Take 500 mg by mouth every evening., Disp: , Rfl:    midodrine (PROAMATINE) 5 MG tablet, Take 3 tablets (15 mg total) by mouth 3 (three) times daily with meals., Disp: 270 tablet, Rfl: 2   montelukast (SINGULAIR) 10 MG tablet, TAKE 1 TABLET BY MOUTH EVERY DAY, Disp: 90 tablet, Rfl: 1   nitroGLYCERIN (NITROSTAT) 0.4 MG SL tablet, PLACE 1 TABLET UNDER THE TONGUE EVERY 5 MINUTES X 3 DOSES AS NEEDED FOR CHEST PAIN *MAX 3 DOSES*, Disp: 25 tablet, Rfl: 7   oxybutynin (DITROPAN) 5 MG tablet, Take 5 mg by mouth 2 (two) times daily as needed., Disp: , Rfl:    pregabalin (LYRICA) 200 MG capsule, TAKE 1 CAPSULE (200 MG TOTAL) BY MOUTH 3 (THREE) TIMES DAILY., Disp: 270 capsule, Rfl: 1   rosuvastatin (CRESTOR) 5 MG tablet, Take 1 tablet (5 mg total) by mouth daily., Disp: 90 tablet, Rfl: 3   sildenafil (VIAGRA) 100 MG tablet, Take 0.5 tablets (50 mg total) by mouth as needed for erectile dysfunction  (30 mintues prior to sexual intercourse)., Disp: 10 tablet, Rfl: 3   tamsulosin (FLOMAX) 0.4 MG CAPS capsule, Take 1 capsule (0.4 mg total) by mouth daily., Disp: 30 capsule, Rfl: 0   zolpidem (AMBIEN) 10 MG tablet, Take 10 mg by mouth at bedtime as needed for sleep., Disp: , Rfl:  No current facility-administered medications for this visit.  Facility-Administered Medications Ordered in Other Visits:    0.9 %  sodium chloride infusion, , Intravenous, Continuous, Gorsuch, Ni, MD, Last Rate: 50 mL/hr at 03/07/14 1005, New Bag at 03/07/14 1005   sodium chloride 0.9 % injection 10 mL, 10 mL, Intracatheter, PRN, Marcy Panning, MD, 10 mL at 08/22/12 1721   [name], DO [facility] 01/13/2021 11:11 AM

## 2021-01-13 NOTE — Patient Instructions (Addendum)
Thank you for visiting Dr. Valeta Harms at [facility]. Today we recommend the following:  Refills for cough medicine.   Return in about 6 months (around 07/13/2021) for w/ Dr. Valeta Harms.    Please do your part to reduce the spread of COVID-19.

## 2021-01-14 ENCOUNTER — Encounter: Payer: Self-pay | Admitting: Physical Therapy

## 2021-01-14 ENCOUNTER — Ambulatory Visit
Admission: RE | Admit: 2021-01-14 | Discharge: 2021-01-14 | Disposition: A | Discharge status: Home / Self Care | Payer: Medicare Other | Source: Ambulatory Visit | Attending: Radiation Oncology | Admitting: Radiation Oncology

## 2021-01-14 ENCOUNTER — Other Ambulatory Visit: Payer: Self-pay

## 2021-01-14 DIAGNOSIS — C3412 Malignant neoplasm of upper lobe, left bronchus or lung: Secondary | ICD-10-CM | POA: Insufficient documentation

## 2021-01-14 DIAGNOSIS — Z510 Encounter for antineoplastic radiation therapy: Secondary | ICD-10-CM | POA: Insufficient documentation

## 2021-01-15 ENCOUNTER — Other Ambulatory Visit: Payer: Self-pay | Admitting: Internal Medicine

## 2021-01-15 DIAGNOSIS — J301 Allergic rhinitis due to pollen: Secondary | ICD-10-CM

## 2021-01-19 ENCOUNTER — Ambulatory Visit
Admission: RE | Admit: 2021-01-19 | Discharge: 2021-01-19 | Disposition: A | Discharge status: Home / Self Care | Payer: Medicare Other | Source: Ambulatory Visit | Attending: Radiation Oncology | Admitting: Radiation Oncology

## 2021-01-19 ENCOUNTER — Other Ambulatory Visit: Payer: Self-pay

## 2021-01-19 DIAGNOSIS — Z510 Encounter for antineoplastic radiation therapy: Secondary | ICD-10-CM | POA: Diagnosis not present

## 2021-01-19 NOTE — Progress Notes (Signed)
  Radiation Oncology         ([phone]) [phone] ________________________________  Name: Ryan Copeland MRN: [id number]  Date: 01/19/2021  DOB: [birth year]  End of Treatment Note  Diagnosis:     At least Stage IA2, cT1bN0M0, NSCLC, of the LUL  Indication for treatment:  Curative       Radiation treatment dates:   01/12/21-01/19/21  Site/dose:   The tumor in the LUL was treated with a course of stereotactic body radiation treatment. The patient received 54 Gy In 3 fractions at 18 G per fraction.  Narrative: The patient tolerated radiation treatment relatively well.   The patient did not have any signs of acute toxicity during treatment. He did have anticipated skin changes in the treatment field when I saw him today in the treatment area. No other complaints are verbalized.   Plan: The patient will receive a call in about one month from the radiation oncology department. He will continue follow up with Dr. Alvy Bimler and begin surveillance of his lung cancer as well given his history of CLL/Lymphoma.      Carola Rhine, PAC

## 2021-01-21 LAB — ACID FAST CULTURE WITH REFLEXED SENSITIVITIES (MYCOBACTERIA): Acid Fast Culture: NEGATIVE

## 2021-01-21 LAB — GUARDANT 360

## 2021-01-22 ENCOUNTER — Other Ambulatory Visit: Payer: Self-pay | Admitting: Internal Medicine

## 2021-01-22 ENCOUNTER — Other Ambulatory Visit (HOSPITAL_COMMUNITY): Payer: Self-pay

## 2021-01-22 ENCOUNTER — Other Ambulatory Visit: Payer: Self-pay | Admitting: Hematology and Oncology

## 2021-01-22 DIAGNOSIS — R053 Chronic cough: Secondary | ICD-10-CM

## 2021-01-22 DIAGNOSIS — C8308 Small cell B-cell lymphoma, lymph nodes of multiple sites: Secondary | ICD-10-CM

## 2021-01-22 MED ORDER — CALQUENCE 100 MG PO TABS
100.0000 mg | ORAL_TABLET | Freq: Two times a day (BID) | ORAL | 1 refills | Status: DC
  Filled 2021-01-22: qty 60, 30d supply, fill #0

## 2021-01-25 ENCOUNTER — Other Ambulatory Visit (HOSPITAL_COMMUNITY): Payer: Self-pay

## 2021-01-29 ENCOUNTER — Inpatient Hospital Stay: Payer: Medicare Other

## 2021-01-29 ENCOUNTER — Inpatient Hospital Stay (HOSPITAL_BASED_OUTPATIENT_CLINIC_OR_DEPARTMENT_OTHER): Payer: Medicare Other | Admitting: Hematology and Oncology

## 2021-01-29 ENCOUNTER — Inpatient Hospital Stay: Payer: Medicare Other | Attending: Hematology and Oncology

## 2021-01-29 ENCOUNTER — Encounter: Payer: Self-pay | Admitting: Hematology and Oncology

## 2021-01-29 ENCOUNTER — Other Ambulatory Visit: Payer: Self-pay

## 2021-01-29 VITALS — BP 90/55 | HR 55 | Temp 98.3°F | Resp 16

## 2021-01-29 DIAGNOSIS — C8308 Small cell B-cell lymphoma, lymph nodes of multiple sites: Secondary | ICD-10-CM

## 2021-01-29 DIAGNOSIS — Z0000 Encounter for general adult medical examination without abnormal findings: Secondary | ICD-10-CM

## 2021-01-29 DIAGNOSIS — C3492 Malignant neoplasm of unspecified part of left bronchus or lung: Secondary | ICD-10-CM

## 2021-01-29 DIAGNOSIS — D801 Nonfamilial hypogammaglobulinemia: Secondary | ICD-10-CM

## 2021-01-29 DIAGNOSIS — Z23 Encounter for immunization: Secondary | ICD-10-CM

## 2021-01-29 DIAGNOSIS — D61818 Other pancytopenia: Secondary | ICD-10-CM

## 2021-01-29 DIAGNOSIS — Z95828 Presence of other vascular implants and grafts: Secondary | ICD-10-CM

## 2021-01-29 DIAGNOSIS — C9110 Chronic lymphocytic leukemia of B-cell type not having achieved remission: Secondary | ICD-10-CM

## 2021-01-29 LAB — CBC WITH DIFFERENTIAL/PLATELET
Abs Immature Granulocytes: 0.02 10*3/uL (ref 0.00–0.07)
Basophils Absolute: 0 10*3/uL (ref 0.0–0.1)
Basophils Relative: 0 %
Eosinophils Absolute: 0 10*3/uL (ref 0.0–0.5)
Eosinophils Relative: 0 %
HCT: 36.1 % — ABNORMAL LOW (ref 39.0–52.0)
Hemoglobin: 11.2 g/dL — ABNORMAL LOW (ref 13.0–17.0)
Immature Granulocytes: 0 %
Lymphocytes Relative: 70 %
Lymphs Abs: 7.7 10*3/uL — ABNORMAL HIGH (ref 0.7–4.0)
MCH: 27.6 pg (ref 26.0–34.0)
MCHC: 31 g/dL (ref 30.0–36.0)
MCV: 88.9 fL (ref 80.0–100.0)
Monocytes Absolute: 0.6 10*3/uL (ref 0.1–1.0)
Monocytes Relative: 5 %
Neutro Abs: 2.8 10*3/uL (ref 1.7–7.7)
Neutrophils Relative %: 25 %
Platelets: 68 10*3/uL — ABNORMAL LOW (ref 150–400)
RBC: 4.06 MIL/uL — ABNORMAL LOW (ref 4.22–5.81)
RDW: 17.2 % — ABNORMAL HIGH (ref 11.5–15.5)
Smear Review: NORMAL
WBC: 11.1 10*3/uL — ABNORMAL HIGH (ref 4.0–10.5)
nRBC: 0 % (ref 0.0–0.2)

## 2021-01-29 LAB — COMPREHENSIVE METABOLIC PANEL
ALT: 31 U/L (ref 0–44)
AST: 20 U/L (ref 15–41)
Albumin: 3.6 g/dL (ref 3.5–5.0)
Alkaline Phosphatase: 125 U/L (ref 38–126)
Anion gap: 8 (ref 5–15)
BUN: 11 mg/dL (ref 8–23)
CO2: 26 mmol/L (ref 22–32)
Calcium: 8.6 mg/dL — ABNORMAL LOW (ref 8.9–10.3)
Chloride: 106 mmol/L (ref 98–111)
Creatinine, Ser: 0.8 mg/dL (ref 0.61–1.24)
GFR, Estimated: >60 mL/min (ref >60)
Glucose, Bld: 131 mg/dL — ABNORMAL HIGH (ref 70–99)
Potassium: 4.5 mmol/L (ref 3.5–5.1)
Sodium: 140 mmol/L (ref 135–145)
Total Bilirubin: 0.3 mg/dL (ref 0.3–1.2)
Total Protein: 6.1 g/dL — ABNORMAL LOW (ref 6.5–8.1)

## 2021-01-29 MED ORDER — METHYLPREDNISOLONE SODIUM SUCC 40 MG IJ SOLR
40.0000 mg | Freq: Once | INTRAMUSCULAR | Status: AC
  Administered 2021-01-29: 40 mg via INTRAVENOUS
  Filled 2021-01-29: qty 1

## 2021-01-29 MED ORDER — DIPHENHYDRAMINE HCL 25 MG PO CAPS
25.0000 mg | ORAL_CAPSULE | Freq: Once | ORAL | Status: AC
  Administered 2021-01-29: 25 mg via ORAL
  Filled 2021-01-29: qty 1

## 2021-01-29 MED ORDER — IMMUNE GLOBULIN (HUMAN) 10 GM/100ML IJ SOLN
0.5000 g/kg | Freq: Once | INTRAMUSCULAR | Status: AC
  Administered 2021-01-29: 40 g via INTRAVENOUS
  Filled 2021-01-29: qty 400

## 2021-01-29 MED ORDER — SODIUM CHLORIDE 0.9% FLUSH
10.0000 mL | Freq: Once | INTRAVENOUS | Status: AC
  Administered 2021-01-29: 10 mL

## 2021-01-29 MED ORDER — ALLOPURINOL 300 MG PO TABS: 300.0000 mg | ORAL_TABLET | Freq: Every day | ORAL | 1 refills | Status: AC

## 2021-01-29 MED ORDER — ACETAMINOPHEN 325 MG PO TABS
650.0000 mg | ORAL_TABLET | Freq: Once | ORAL | Status: AC
  Administered 2021-01-29: 650 mg via ORAL
  Filled 2021-01-29: qty 2

## 2021-01-29 MED ORDER — DIPHENOXYLATE-ATROPINE 2.5-0.025 MG PO TABS: 1.0000 | ORAL_TABLET | Freq: Four times a day (QID) | ORAL | 1 refills | Status: AC | PRN

## 2021-01-29 MED ORDER — HEPARIN SOD (PORK) LOCK FLUSH 100 UNIT/ML IV SOLN
500.0000 [IU] | Freq: Once | INTRAVENOUS | Status: AC
  Administered 2021-01-29: 500 [IU]

## 2021-01-29 MED ORDER — INFLUENZA VAC A&B SA ADJ QUAD 0.5 ML IM PRSY
0.5000 mL | PREFILLED_SYRINGE | Freq: Once | INTRAMUSCULAR | Status: AC
  Administered 2021-01-29: 0.5 mL via INTRAMUSCULAR
  Filled 2021-01-29: qty 0.5

## 2021-01-29 NOTE — Progress Notes (Signed)
[place] OFFICE PROGRESS NOTE  Patient Care Team: [name]., PA-C as PCP - General (Family Medicine) Freada Bergeron, MD as PCP - Cardiology (Cardiology) Carol Ada, MD as Consulting Physician (Gastroenterology)  ASSESSMENT & PLAN:  Non-small cell carcinoma of lung, left [facility]) He has recently completed radiation therapy I recommend minimum 6 to 8 weeks after completion radiation therapy before repeating imaging study I will arrange repeat imaging study in February when I see him next month We have extensive discussion reviewing his recent test results of guardant 360 So far, he has no actionable mutation that we can rely on for treatment  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 [facility]) He has good response with Calquence He will continue the same  Pancytopenia, acquired ([place]) He has slight pancytopenia, multifactorial  His chronic thrombocytopenia stable He had recent anemia due to anemia chronic illness and that is stable He does not need further work-up or transfusion support  Hypogammaglobulinemia, acquired We discussed the risk and benefits of monthly IVIG treatment and he is in agreement to proceed So far, since he restarted IVIG treatment, he has less recurrent infection  No orders of the defined types were placed in this encounter.   All questions were answered. The patient knows to call the clinic with any problems, questions or concerns. The total time spent in the appointment was 30 minutes encounter with patients including review of chart and various tests results, discussions about plan of care and coordination of care plan   [name], MD 01/29/2021 5:46 PM  INTERVAL HISTORY: Please see below for problem oriented charting. 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   REVIEW OF SYSTEMS:   Constitutional: Denies fevers, chills or abnormal weight loss Eyes: Denies blurriness of vision Ears, nose, mouth, throat, and face: Denies mucositis or sore throat Respiratory: Denies cough, dyspnea or wheezes Cardiovascular: Denies palpitation, chest discomfort or lower extremity swelling Gastrointestinal:  Denies nausea, heartburn or change in bowel habits Skin: Denies abnormal skin rashes Lymphatics: Denies new lymphadenopathy or easy bruising Neurological:Denies numbness, tingling or new weaknesses Behavioral/Psych: Mood is stable, no new changes  All other systems were reviewed with the patient and are negative.  I have reviewed the past medical history, past surgical history, social history and family history with the patient and they are unchanged from previous note.  ALLERGIES:  is allergic to immune globulin and codeine.  MEDICATIONS:  Current Outpatient Medications  Medication Sig Dispense Refill   Acalabrutinib Maleate (CALQUENCE) 100 MG TABS Take 1 tablet (100 mg) by mouth 2 (two) times daily. 60 tablet 1   acetaminophen (TYLENOL) 325 MG tablet Take 2 tablets (650 mg total) by mouth every 6 (six) hours as needed for moderate pain or fever. 30 tablet 0   acyclovir (ZOVIRAX) 400 MG tablet TAKE 1 TABLET BY MOUTH TWICE A DAY 180 tablet 1   allopurinol (ZYLOPRIM) 300 MG tablet Take 1 tablet (300 mg total) by mouth daily. 90 tablet 1   ALPRAZolam (XANAX) 0.5 MG tablet Take 0.5 mg by  mouth at bedtime as needed for anxiety.     b complex vitamins tablet Take 1 tablet by mouth daily.      benzonatate (TESSALON) 100 MG capsule TAKE TWO  CAPSULES BY MOUTH THREE TIMES A DAY AS NEEDED 90 capsule 1   Cholecalciferol 25 MCG (1000 UT) tablet Take 1,000 Units by mouth daily.      diphenoxylate-atropine (LOMOTIL) 2.5-0.025 MG tablet Take 1 tablet by mouth 4 (four) times daily as needed for diarrhea or loose stools. 60 tablet 1   famotidine (PEPCID) 20 MG tablet TAKE 1 TABLET BY MOUTH TWICE A DAY 180 tablet 1   furosemide (LASIX) 20 MG tablet Take 20 mg by mouth daily.     HYDROcodone bit-homatropine (HYCODAN) 5-1.5 MG/5ML syrup Take 5 mLs by mouth every 6 (six) hours as needed for cough. 240 mL 0   HYDROcodone-acetaminophen (NORCO) 10-325 MG tablet Take 1 tablet by mouth every 6 (six) hours as needed for moderate pain.     lidocaine-prilocaine (EMLA) cream Apply 1 application topically as needed (prior chemo/IBIG). 30 g 9   loperamide (IMODIUM) 2 MG capsule Take 2 capsules (4 mg total) by mouth 4 (four) times daily. 120 capsule 9   meclizine (ANTIVERT) 12.5 MG tablet Take 12.5 mg by mouth 3 (three) times daily as needed for dizziness.     metFORMIN (GLUCOPHAGE-XR) 500 MG 24 hr tablet Take 500 mg by mouth every evening.     midodrine (PROAMATINE) 5 MG tablet Take 3 tablets (15 mg total) by mouth 3 (three) times daily with meals. 270 tablet 2   montelukast (SINGULAIR) 10 MG tablet TAKE 1 TABLET BY MOUTH EVERY DAY 90 tablet 1   nitroGLYCERIN (NITROSTAT) 0.4 MG SL tablet PLACE 1 TABLET UNDER THE TONGUE EVERY 5 MINUTES X 3 DOSES AS NEEDED FOR CHEST PAIN *MAX 3 DOSES* 25 tablet 7   oxybutynin (DITROPAN) 5 MG tablet Take 5 mg by mouth 2 (two) times daily as needed.     pregabalin (LYRICA) 200 MG capsule TAKE 1 CAPSULE (200 MG TOTAL) BY MOUTH 3 (THREE) TIMES DAILY. 270 capsule 1   rosuvastatin (CRESTOR) 5 MG tablet Take 1 tablet (5 mg total) by mouth daily. 90 tablet 3   sildenafil (VIAGRA) 100 MG tablet Take 0.5 tablets (50 mg total) by mouth as needed for erectile dysfunction (30 mintues prior to sexual intercourse). 10 tablet 3   tamsulosin  (FLOMAX) 0.4 MG CAPS capsule Take 1 capsule (0.4 mg total) by mouth daily. 30 capsule 0   zolpidem (AMBIEN) 10 MG tablet Take 10 mg by mouth at bedtime as needed for sleep.     No current facility-administered medications for this visit.   Facility-Administered Medications Ordered in Other Visits  Medication Dose Route Frequency Provider Last Rate Last Admin   0.9 %  sodium chloride infusion   Intravenous Continuous Alvy Bimler, Lajune Perine, MD 50 mL/hr at 03/07/14 1005 New Bag at 03/07/14 1005   sodium chloride 0.9 % injection 10 mL  10 mL Intracatheter PRN Marcy Panning, MD   10 mL at 08/22/12 1721    SUMMARY OF ONCOLOGIC HISTORY: Oncology History Overview Note  FISH: del 13q  Prior treatment with FCR, Bendamustine & Rituximab Path from bronchoscopy scant, unable to order additional immunostains per pathologist-NG 12/14/20   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  No evidence of aortic dissection or other acute process in the chest. 2.  Centrilobular emphysema with a 5  mm right lung nodule. Given the concurrent centrilobular emphysema, follow-up chest CT at 6 -12 months is recommended.  3.  Coronary artery atherosclerosis which is age advanced. 4.  Prominent thoracic lymph nodes.  These can be reevaluated at follow-up.   05/06/2010 Imaging   1.  Multiple small periaortic lymph nodes consistent with the patient's history of the chronic lymphocytic leukemia. 2.  No evidence of solid organ involvement   11/03/2011 Imaging   1.  Interval progression of abdominal and pelvic adenopathy. 2.  Progression of splenomegaly.  The spleen now measures 23 cm in length   11/18/2011 Bone Marrow Biopsy   Bone Marrow, Aspirate,Biopsy, and Clot, right iliac bone - HYPERCELLULAR BONE MARROW WITH EXTENSIVE INVOLVEMENT BY CHRONIC LYMPHOCYTIC LEUKEMIA. PERIPHERAL BLOOD: - CHRONIC LYMPHOCYTIC LEUKEMIA   03/08/2012 Imaging   1.  Progressive increase in  retroperitoneal, iliac, and inguinal lymphadenopathy. 2.  Interval increase in massive splenomegaly.     06/01/2012 Procedure   Placement of single lumen port a cath via right internal jugular vein.  The catheter tip lies at the cavoatrial junction.  A power injectable port a cath was placed and is ready for immediate use   06/20/2012 - 11/23/2012 Chemotherapy   He received FCR x 6 cycles   12/19/2012 Imaging   Left common iliac stent. Abdominal vasculature remains patent. Improving supraclavicular and axillary lymphadenopathy. Residual right subpectoral nodes measure up to 10 mm short axis. Improving retroperitoneal lymphadenopathy, measuring up to 16 mm short axis. Improving splenomegaly, measuring 18.7 cm.     01/20/2016 Imaging   1. Stable exam.  No new or progressive findings. 2. No CT findings to explain odynophagia   09/02/2016 Pathology Results   The findings are consistent with involvement by previously known chronic lymphocytic leukemia   09/02/2016 Pathology Results   FISH for CLL came back positive for deletion 13q   09/15/2016 Imaging   1. Borderline enlarged abdominal and pelvic lymph nodes. Compared with 11/03/2011 these are decreased in size as detailed above. 2. Persistent splenomegaly. 3. Aortic Atherosclerosis (ICD10-I70.0). LAD coronary artery calcification noted.   10/06/2016 - 02/24/2017 Chemotherapy   He received Bendamustine and Rituxan   11/03/2016 Adverse Reaction   Dose of Bendamustine is reduced due to severe pancytopenia   12/28/2016 Imaging   1. Borderline enlarged abdominal peritoneal ligament and abdominal retroperitoneal lymph nodes, stable. 2. Splenomegaly. 3.  Aortic atherosclerosis (ICD10-170.0).   03/22/2017 PET scan   1. No hypermetabolic adenopathy identified within the neck, chest, abdomen or pelvis. 2. Prominent left retroperitoneal node measures 1.6 cm without significant FDG uptake. 3. Splenomegaly. 4. Aortic Atherosclerosis (ICD10-I70.0) and  Emphysema (ICD10-J43.9). LAD and left circumflex atherosclerotic calcifications noted.   02/02/2018 Procedure   IMPRESSION: Widely patent right IJ power port catheter.   04/06/2018 Imaging   1. Interval enlargement of axillary, mediastinal, and retroperitoneal lymph nodes, as well as increased splenomegaly, findings concerning for progression of CLL in comparison to prior PET-CT dated 03/22/2017.   2.  Other chronic and incidental findings as detailed above.     04/16/2018 -  Chemotherapy   The patient had acalabrutinib for chemo   04/17/2018 - 04/21/2018 Hospital Admission   He was hospitalized for influenza   11/29/2018 Imaging   1. Interval response to therapy. No thoracic added not scratch set no thoracic or pelvic adenopathy identified. Significant improvement in abdominal adenopathy. Largest remaining lymph node measures 1.3cm in the left retroperitoneal region. Previously 2.5 cm. 2. Persistent splenomegaly, improved from previous exam. 3. No  new or progressive disease identified within the chest, abdomen or pelvis. 4. Stable appearance of 5 mm right middle lobe lung nodule. 5. Aortic Atherosclerosis (ICD10-I70.0) and Emphysema (ICD10-J43.9). Coronary artery calcifications.     09/17/2020 Imaging   CT neck  Negative for mass or adenopathy in the neck. No evidence of recurrent CLL   09/17/2020 Imaging   1. Interval enlargement of a perihilar nodule anteriorly in the left upper lobe, measuring 2.1 x 1.2 cm, previously 1.4 x 0.9 cm when measured similarly. This is highly concerning for a slowly enlarging primary lung malignancy. Recommend multidisciplinary thoracic referral for consideration of metabolic characterization by PET-CT and tissue sampling. 2. There is a new nodule of the anterior left upper lobe measuring 1.0 cm, nonspecific although worrisome for satellite nodule or metastasis. 3. Unchanged enlargement of a left hilar lymph node, concerning for nodal metastasis. 4. Unchanged  consolidation and nodularity of the infrahilar left lower lobe as well as of the perihilar right middle lobe, these findings generally nonspecific and infectious or inflammatory, appearance particularly suggesting atypical mycobacterial infection.  5. Redemonstrated enlarged right axillary and retroperitoneal lymph nodes, stable to slightly diminished in size. These are in keeping with reported diagnosis of CLL. 6. Slightly improved splenomegaly, maximum coronal span 18.0 cm.  7. Emphysema. 8. Coronary artery disease.   Aortic Atherosclerosis (ICD10-I70.0) and Emphysema (ICD10-J43.9).   Non-small cell carcinoma of lung, left ([place])  09/18/2020 Initial Diagnosis   Non-small cell carcinoma of lung, left ([place])   09/20/2020 Cancer Staging   Staging form: Lung, AJCC 8th Edition - Clinical stage from 09/20/2020: Stage IVA (cT3, cN1, cM1a) - Signed by [name], MD on 09/20/2020 Stage prefix: Initial diagnosis    09/30/2020 Imaging   IMPRESSION MRI brain No evidence of metastatic disease in the brain.   10/05/2020 PET scan   Hypermetabolic 1 cm nodule in the LEFT upper lobe suspicious for pulmonary neoplasm. Referral to multi disciplinary thoracic oncologic setting is suggested if not yet performed. Biopsy may be warranted.   Juxta hilar nodule that shows enlargement over time and associated LEFT hilar lymph node without pronounced FDG uptake but given enlargement and uptake above blood pool raising the question of indolent neoplasm in this location.   Stable small pulmonary nodule in the RIGHT middle lobe and stable RIGHT upper lobe subsolid nodule, attention on follow-up.   RIGHT axillary and retroperitoneal nodal enlargement with mildly elevated FDG uptake near mediastinal blood pool with respect to retroperitoneal lymph nodes, likely related to the patient's known CLL. Splenic size remains smaller than in 2022. Close attention on follow-up is suggested.   Aortic Atherosclerosis (ICD10-I70.0).    Emphysema (ICD10-J43.9).   12/08/2020 Pathology Results   FINAL MICROSCOPIC DIAGNOSIS:   A. LUNG, LUL, BRUSHING:  - Malignant cells consistent with non-small cell carcinoma, see comment   B. LUNG, LUL, NEEDLE  BIOPSIES:  - Malignant cells consistent with non-small cell carcinoma   01/12/2021 - 01/19/2021 Radiation Therapy   Radiation treatment dates:   01/12/21-01/19/21   Site/dose:   The tumor in the LUL was treated with a course of stereotactic body radiation treatment. The patient received 54 Gy In 3 fractions at 18 G per fraction     PHYSICAL EXAMINATION: ECOG PERFORMANCE STATUS: 2 - Symptomatic, <50% confined to bed  Vitals:   01/29/21 1007  BP: 123/61  Pulse: 64  Resp: 18  Temp: 97.7 F (36.5 C)  SpO2: 99%   Filed Weights   01/29/21 1007  Weight: 172 lb  12.8 oz (78.4 kg)    GENERAL:alert, no distress and comfortable SKIN: He has extensive skin bruises NEURO: alert & oriented x 3 with fluent speech, no focal motor/sensory deficits  LABORATORY DATA:  I have reviewed the data as listed    Component Value Date/Time   NA 140 01/29/2021 0948   NA 142 06/19/2019 0848   NA 136 01/26/2017 0940   K 4.5 01/29/2021 0948   K 4.0 01/26/2017 0940   CL 106 01/29/2021 0948   CL 104 08/03/2012 1229   CO2 26 01/29/2021 0948   CO2 23 01/26/2017 0940   GLUCOSE 131 (H) 01/29/2021 0948   GLUCOSE 146 (H) 01/26/2017 0940   GLUCOSE 223 (H) 08/03/2012 1229   BUN 11 01/29/2021 0948   BUN 13 06/19/2019 0848   BUN 9.6 01/26/2017 0940   CREATININE 0.80 01/29/2021 0948   CREATININE 0.71 12/04/2020 0828   CREATININE 1.0 01/26/2017 0940   CALCIUM 8.6 (L) 01/29/2021 0948   CALCIUM 8.7 01/26/2017 0940   PROT 6.1 (L) 01/29/2021 0948   PROT 5.9 (L) 04/13/2017 0823   PROT 5.7 (L) 01/26/2017 0940   ALBUMIN 3.6 01/29/2021 0948   ALBUMIN 3.6 01/26/2017 0940   AST 20 01/29/2021 0948   AST 29 12/04/2020 0828   AST 13 01/26/2017 0940   ALT 31 01/29/2021 0948   ALT 30 12/04/2020 0828    ALT 12 01/26/2017 0940   ALKPHOS 125 01/29/2021 0948   ALKPHOS 78 01/26/2017 0940   BILITOT 0.3 01/29/2021 0948   BILITOT 0.4 12/04/2020 0828   BILITOT 0.64 01/26/2017 0940   GFRNONAA >60 01/29/2021 0948   GFRNONAA >60 12/04/2020 0828   GFRAA >60 10/25/2019 0819   GFRAA >60 04/24/2018 0821    No results found for: SPEP, UPEP  Lab Results  Component Value Date   WBC 11.1 (H) 01/29/2021   NEUTROABS 2.8 01/29/2021   HGB 11.2 (L) 01/29/2021   HCT 36.1 (L) 01/29/2021   MCV 88.9 01/29/2021   PLT 68 (L) 01/29/2021      Chemistry      Component Value Date/Time   NA 140 01/29/2021 0948   NA 142 06/19/2019 0848   NA 136 01/26/2017 0940   K 4.5 01/29/2021 0948   K 4.0 01/26/2017 0940   CL 106 01/29/2021 0948   CL 104 08/03/2012 1229   CO2 26 01/29/2021 0948   CO2 23 01/26/2017 0940   BUN 11 01/29/2021 0948   BUN 13 06/19/2019 0848   BUN 9.6 01/26/2017 0940   CREATININE 0.80 01/29/2021 0948   CREATININE 0.71 12/04/2020 0828   CREATININE 1.0 01/26/2017 0940      Component Value Date/Time   CALCIUM 8.6 (L) 01/29/2021 0948   CALCIUM 8.7 01/26/2017 0940   ALKPHOS 125 01/29/2021 0948   ALKPHOS 78 01/26/2017 0940   AST 20 01/29/2021 0948   AST 29 12/04/2020 0828   AST 13 01/26/2017 0940   ALT 31 01/29/2021 0948   ALT 30 12/04/2020 0828   ALT 12 01/26/2017 0940   BILITOT 0.3 01/29/2021 0948   BILITOT 0.4 12/04/2020 0828   BILITOT 0.64 01/26/2017 0940

## 2021-01-29 NOTE — Assessment & Plan Note (Signed)
He has good response with Calquence He will continue the same

## 2021-01-29 NOTE — Assessment & Plan Note (Signed)
We discussed the risk and benefits of monthly IVIG treatment and he is in agreement to proceed So far, since he restarted IVIG treatment, he has less recurrent infection

## 2021-01-29 NOTE — Assessment & Plan Note (Signed)
He has recently completed radiation therapy I recommend minimum 6 to 8 weeks after completion radiation therapy before repeating imaging study I will arrange repeat imaging study in February when I see him next month We have extensive discussion reviewing his recent test results of guardant 360 So far, he has no actionable mutation that we can rely on for treatment

## 2021-01-29 NOTE — Assessment & Plan Note (Signed)
He has slight pancytopenia, multifactorial  His chronic thrombocytopenia stable He had recent anemia due to anemia chronic illness and that is stable He does not need further work-up or transfusion support

## 2021-01-29 NOTE — Patient Instructions (Signed)
Immune Globulin Injection What is this medication? IMMUNE GLOBULIN (im MUNE GLOB yoo lin) helps to prevent or reduce the severity of certain infections in patients who are at risk. This medicine is collected from the pooled blood of many donors. It is used to treat immune systemproblems, thrombocytopenia, and Kawasaki syndrome. This medicine may be used for other purposes; ask your health care provider orpharmacist if you have questions. COMMON BRAND NAME(S): ASCENIV, Baygam, BIVIGAM, Carimune, Carimune NF, cutaquig, Cuvitru, Flebogamma, Flebogamma DIF, GamaSTAN, GamaSTAN S/D, Gamimune N, Gammagard, Gammagard S/D, Gammaked, Gammaplex, Gammar-P IV, Gamunex, Gamunex-C, Hizentra, Iveegam, Iveegam EN, Octagam, Panglobulin, Panglobulin NF, panzyga, Polygam S/D, Privigen, Sandoglobulin, Venoglobulin-S, Vigam,Vivaglobulin, Xembify What should I tell my care team before I take this medication? They need to know if you have any of these conditions: diabetes extremely low or no immune antibodies in the blood heart disease history of blood clots hyperprolinemia infection in the blood, sepsis kidney disease recently received or scheduled to receive a vaccination an unusual or allergic reaction to human immune globulin, albumin, maltose, sucrose, other medicines, foods, dyes, or preservatives pregnant or trying to get pregnant breast-feeding How should I use this medication? This medicine is for injection into a muscle or infusion into a vein or skin. It is usually given by a health care professional in a hospital or clinicsetting. In rare cases, some brands of this medicine might be given at home. You will be taught how to give this medicine. Use exactly as directed. Take your medicineat regular intervals. Do not take your medicine more often than directed. Talk to your pediatrician regarding the use of this medicine in children. Whilethis drug may be prescribed for selected conditions, precautions do  apply. Overdosage: If you think you have taken too much of this medicine contact apoison control center or emergency room at once. NOTE: This medicine is only for you. Do not share this medicine with others. What if I miss a dose? It is important not to miss your dose. Call your doctor or health care professional if you are unable to keep an appointment. If you give yourself the medicine and you miss a dose, take it as soon as you can. If it is almost timefor your next dose, take only that dose. Do not take double or extra doses. What may interact with this medication? aspirin and aspirin-like medicines cisplatin cyclosporine medicines for infection like acyclovir, adefovir, amphotericin B, bacitracin, cidofovir, foscarnet, ganciclovir, gentamicin, pentamidine, vancomycin NSAIDS, medicines for pain and inflammation, like ibuprofen or naproxen pamidronate vaccines zoledronic acid This list may not describe all possible interactions. Give your health care provider a list of all the medicines, herbs, non-prescription drugs, or dietary supplements you use. Also tell them if you smoke, drink alcohol, or use illegaldrugs. Some items may interact with your medicine. What should I watch for while using this medication? Your condition will be monitored carefully while you are receiving thismedicine. This medicine is made from pooled blood donations of many different people. It may be possible to pass an infection in this medicine. However, the donors are screened for infections and all products are tested for HIV and hepatitis. The medicine is treated to kill most or all bacteria and viruses. Talk to yourdoctor about the risks and benefits of this medicine. Do not have vaccinations for at least 14 days before, or until at least 3months after receiving this medicine. What side effects may I notice from receiving this medication? Side effects that you should report to your doctor   or health care  professionalas soon as possible: allergic reactions like skin rash, itching or hives, swelling of the face, lips, or tongue blue colored lips or skin breathing problems chest pain or tightness fever signs and symptoms of aseptic meningitis such as stiff neck; sensitivity to light; headache; drowsiness; fever; nausea; vomiting; rash signs and symptoms of a blood clot such as chest pain; shortness of breath; pain, swelling, or warmth in the leg signs and symptoms of hemolytic anemia such as fast heartbeat; tiredness; dark yellow or brown urine; or yellowing of the eyes or skin signs and symptoms of kidney injury like trouble passing urine or change in the amount of urine sudden weight gain swelling of the ankles, feet, hands Side effects that usually do not require medical attention (report to yourdoctor or health care professional if they continue or are bothersome): diarrhea flushing headache increased sweating joint pain muscle cramps muscle pain nausea pain, redness, or irritation at site where injected tiredness This list may not describe all possible side effects. Call your doctor for medical advice about side effects. You may report side effects to FDA at1-800-FDA-1088. Where should I keep my medication? Keep out of the reach of children. This drug is usually given in a hospital or clinic and will not be stored athome. In rare cases, some brands of this medicine may be given at home. If you are using this medicine at home, you will be instructed on how to store thismedicine. Throw away any unused medicine after the expiration date on the label. NOTE: This sheet is a summary. It may not cover all possible information. If you have questions about this medicine, talk to your doctor, pharmacist, orhealth care provider.  2022 Elsevier/Gold Standard (2018-09-05 12:51:14)  

## 2021-02-01 ENCOUNTER — Emergency Department (HOSPITAL_COMMUNITY): Payer: Medicare Other

## 2021-02-01 ENCOUNTER — Other Ambulatory Visit: Payer: Self-pay

## 2021-02-01 ENCOUNTER — Encounter (HOSPITAL_COMMUNITY): Payer: Self-pay | Admitting: Emergency Medicine

## 2021-02-01 ENCOUNTER — Inpatient Hospital Stay (HOSPITAL_COMMUNITY)
Admission: EM | Admit: 2021-02-01 | Discharge: 2021-02-08 | DRG: 477 | Disposition: A | Discharge status: Hospice | Payer: Medicare Other | Attending: Internal Medicine | Admitting: Internal Medicine

## 2021-02-01 DIAGNOSIS — E785 Hyperlipidemia, unspecified: Secondary | ICD-10-CM | POA: Diagnosis present

## 2021-02-01 DIAGNOSIS — G928 Other toxic encephalopathy: Secondary | ICD-10-CM | POA: Diagnosis present

## 2021-02-01 DIAGNOSIS — I77810 Thoracic aortic ectasia: Secondary | ICD-10-CM | POA: Diagnosis not present

## 2021-02-01 DIAGNOSIS — Z515 Encounter for palliative care: Secondary | ICD-10-CM

## 2021-02-01 DIAGNOSIS — J189 Pneumonia, unspecified organism: Secondary | ICD-10-CM | POA: Diagnosis present

## 2021-02-01 DIAGNOSIS — R410 Disorientation, unspecified: Secondary | ICD-10-CM | POA: Diagnosis not present

## 2021-02-01 DIAGNOSIS — D801 Nonfamilial hypogammaglobulinemia: Secondary | ICD-10-CM | POA: Diagnosis present

## 2021-02-01 DIAGNOSIS — Z8249 Family history of ischemic heart disease and other diseases of the circulatory system: Secondary | ICD-10-CM

## 2021-02-01 DIAGNOSIS — Z9359 Other cystostomy status: Secondary | ICD-10-CM

## 2021-02-01 DIAGNOSIS — M109 Gout, unspecified: Secondary | ICD-10-CM | POA: Diagnosis present

## 2021-02-01 DIAGNOSIS — Z955 Presence of coronary angioplasty implant and graft: Secondary | ICD-10-CM

## 2021-02-01 DIAGNOSIS — C7951 Secondary malignant neoplasm of bone: Secondary | ICD-10-CM | POA: Diagnosis not present

## 2021-02-01 DIAGNOSIS — Z801 Family history of malignant neoplasm of trachea, bronchus and lung: Secondary | ICD-10-CM

## 2021-02-01 DIAGNOSIS — Z960 Presence of urogenital implants: Secondary | ICD-10-CM

## 2021-02-01 DIAGNOSIS — Z20822 Contact with and (suspected) exposure to covid-19: Secondary | ICD-10-CM | POA: Diagnosis present

## 2021-02-01 DIAGNOSIS — G4700 Insomnia, unspecified: Secondary | ICD-10-CM | POA: Diagnosis present

## 2021-02-01 DIAGNOSIS — C3492 Malignant neoplasm of unspecified part of left bronchus or lung: Secondary | ICD-10-CM | POA: Diagnosis present

## 2021-02-01 DIAGNOSIS — E782 Mixed hyperlipidemia: Secondary | ICD-10-CM

## 2021-02-01 DIAGNOSIS — R4182 Altered mental status, unspecified: Secondary | ICD-10-CM | POA: Diagnosis present

## 2021-02-01 DIAGNOSIS — Z923 Personal history of irradiation: Secondary | ICD-10-CM

## 2021-02-01 DIAGNOSIS — E1142 Type 2 diabetes mellitus with diabetic polyneuropathy: Secondary | ICD-10-CM | POA: Diagnosis present

## 2021-02-01 DIAGNOSIS — K219 Gastro-esophageal reflux disease without esophagitis: Secondary | ICD-10-CM | POA: Diagnosis present

## 2021-02-01 DIAGNOSIS — T40605A Adverse effect of unspecified narcotics, initial encounter: Secondary | ICD-10-CM | POA: Diagnosis present

## 2021-02-01 DIAGNOSIS — Z66 Do not resuscitate: Secondary | ICD-10-CM | POA: Diagnosis present

## 2021-02-01 DIAGNOSIS — Z888 Allergy status to other drugs, medicaments and biological substances status: Secondary | ICD-10-CM

## 2021-02-01 DIAGNOSIS — Z7984 Long term (current) use of oral hypoglycemic drugs: Secondary | ICD-10-CM

## 2021-02-01 DIAGNOSIS — Z87891 Personal history of nicotine dependence: Secondary | ICD-10-CM

## 2021-02-01 DIAGNOSIS — Z885 Allergy status to narcotic agent status: Secondary | ICD-10-CM

## 2021-02-01 DIAGNOSIS — Z79899 Other long term (current) drug therapy: Secondary | ICD-10-CM

## 2021-02-01 DIAGNOSIS — E876 Hypokalemia: Secondary | ICD-10-CM | POA: Diagnosis present

## 2021-02-01 DIAGNOSIS — C8308 Small cell B-cell lymphoma, lymph nodes of multiple sites: Secondary | ICD-10-CM | POA: Diagnosis present

## 2021-02-01 DIAGNOSIS — E1151 Type 2 diabetes mellitus with diabetic peripheral angiopathy without gangrene: Secondary | ICD-10-CM | POA: Diagnosis present

## 2021-02-01 DIAGNOSIS — I2510 Atherosclerotic heart disease of native coronary artery without angina pectoris: Secondary | ICD-10-CM | POA: Diagnosis present

## 2021-02-01 DIAGNOSIS — I9589 Other hypotension: Secondary | ICD-10-CM | POA: Diagnosis not present

## 2021-02-01 DIAGNOSIS — D696 Thrombocytopenia, unspecified: Secondary | ICD-10-CM | POA: Diagnosis present

## 2021-02-01 DIAGNOSIS — R001 Bradycardia, unspecified: Secondary | ICD-10-CM

## 2021-02-01 DIAGNOSIS — R0602 Shortness of breath: Secondary | ICD-10-CM

## 2021-02-01 DIAGNOSIS — I25119 Atherosclerotic heart disease of native coronary artery with unspecified angina pectoris: Secondary | ICD-10-CM | POA: Diagnosis not present

## 2021-02-01 DIAGNOSIS — M549 Dorsalgia, unspecified: Secondary | ICD-10-CM

## 2021-02-01 DIAGNOSIS — I252 Old myocardial infarction: Secondary | ICD-10-CM

## 2021-02-01 LAB — URINALYSIS, MICROSCOPIC (REFLEX)

## 2021-02-01 LAB — CBC WITH DIFFERENTIAL/PLATELET
Abs Immature Granulocytes: 0.06 10*3/uL (ref 0.00–0.07)
Basophils Absolute: 0 10*3/uL (ref 0.0–0.1)
Basophils Relative: 0 %
Eosinophils Absolute: 0 10*3/uL (ref 0.0–0.5)
Eosinophils Relative: 0 %
HCT: 38.4 % — ABNORMAL LOW (ref 39.0–52.0)
Hemoglobin: 12 g/dL — ABNORMAL LOW (ref 13.0–17.0)
Immature Granulocytes: 1 %
Lymphocytes Relative: 69 %
Lymphs Abs: 6.7 10*3/uL — ABNORMAL HIGH (ref 0.7–4.0)
MCH: 28 pg (ref 26.0–34.0)
MCHC: 31.3 g/dL (ref 30.0–36.0)
MCV: 89.7 fL (ref 80.0–100.0)
Monocytes Absolute: 0.6 10*3/uL (ref 0.1–1.0)
Monocytes Relative: 6 %
Neutro Abs: 2.3 10*3/uL (ref 1.7–7.7)
Neutrophils Relative %: 24 %
Platelets: 66 10*3/uL — ABNORMAL LOW (ref 150–400)
RBC: 4.28 MIL/uL (ref 4.22–5.81)
RDW: 17.1 % — ABNORMAL HIGH (ref 11.5–15.5)
WBC: 9.7 10*3/uL (ref 4.0–10.5)
nRBC: 0 % (ref 0.0–0.2)

## 2021-02-01 LAB — COMPREHENSIVE METABOLIC PANEL
ALT: 25 U/L (ref 0–44)
AST: 17 U/L (ref 15–41)
Albumin: 3.6 g/dL (ref 3.5–5.0)
Alkaline Phosphatase: 86 U/L (ref 38–126)
Anion gap: 8 (ref 5–15)
BUN: 14 mg/dL (ref 8–23)
CO2: 26 mmol/L (ref 22–32)
Calcium: 8.9 mg/dL (ref 8.9–10.3)
Chloride: 100 mmol/L (ref 98–111)
Creatinine, Ser: 0.98 mg/dL (ref 0.61–1.24)
GFR, Estimated: >60 mL/min (ref >60)
Glucose, Bld: 115 mg/dL — ABNORMAL HIGH (ref 70–99)
Potassium: 4.1 mmol/L (ref 3.5–5.1)
Sodium: 134 mmol/L — ABNORMAL LOW (ref 135–145)
Total Bilirubin: 0.3 mg/dL (ref 0.3–1.2)
Total Protein: 6.6 g/dL (ref 6.5–8.1)

## 2021-02-01 LAB — RESP PANEL BY RT-PCR (FLU A&B, COVID) ARPGX2
Influenza A by PCR: NEGATIVE
Influenza B by PCR: NEGATIVE
SARS Coronavirus 2 by RT PCR: NEGATIVE

## 2021-02-01 LAB — AMMONIA: Ammonia: 14 umol/L (ref 9–35)

## 2021-02-01 LAB — URINALYSIS, ROUTINE W REFLEX MICROSCOPIC
Bilirubin Urine: NEGATIVE
Glucose, UA: NEGATIVE mg/dL
Ketones, ur: NEGATIVE mg/dL
Leukocytes,Ua: NEGATIVE
Nitrite: NEGATIVE
Protein, ur: NEGATIVE mg/dL
Specific Gravity, Urine: <1.005 — ABNORMAL LOW (ref 1.005–1.030)
pH: 5.5 (ref 5.0–8.0)

## 2021-02-01 LAB — LACTIC ACID, PLASMA: Lactic Acid, Venous: 1.2 mmol/L (ref 0.5–1.9)

## 2021-02-01 MED ORDER — PREGABALIN 100 MG PO CAPS
200.0000 mg | ORAL_CAPSULE | Freq: Three times a day (TID) | ORAL | Status: DC
  Administered 2021-02-01: 200 mg via ORAL
  Filled 2021-02-01: qty 2

## 2021-02-01 MED ORDER — ACETAMINOPHEN 325 MG PO TABS
650.0000 mg | ORAL_TABLET | Freq: Four times a day (QID) | ORAL | Status: DC | PRN
  Administered 2021-02-02 – 2021-02-05 (×2): 650 mg via ORAL
  Filled 2021-02-01 (×2): qty 2

## 2021-02-01 MED ORDER — ROSUVASTATIN CALCIUM 5 MG PO TABS
5.0000 mg | ORAL_TABLET | Freq: Every day | ORAL | Status: DC
  Administered 2021-02-02 – 2021-02-07 (×6): 5 mg via ORAL
  Filled 2021-02-01 (×6): qty 1

## 2021-02-01 MED ORDER — VANCOMYCIN HCL 1750 MG/350ML IV SOLN
1750.0000 mg | Freq: Once | INTRAVENOUS | Status: AC
  Administered 2021-02-01: 19:00:00 1750 mg via INTRAVENOUS
  Filled 2021-02-01: qty 350

## 2021-02-01 MED ORDER — OXYBUTYNIN CHLORIDE 5 MG PO TABS: 5.0000 mg | ORAL_TABLET | Freq: Two times a day (BID) | ORAL | Status: DC | PRN

## 2021-02-01 MED ORDER — BENZONATATE 100 MG PO CAPS
100.0000 mg | ORAL_CAPSULE | Freq: Three times a day (TID) | ORAL | Status: DC
  Administered 2021-02-01 – 2021-02-07 (×15): 100 mg via ORAL
  Filled 2021-02-01 (×17): qty 1

## 2021-02-01 MED ORDER — MIDODRINE HCL 5 MG PO TABS
15.0000 mg | ORAL_TABLET | Freq: Three times a day (TID) | ORAL | Status: DC
  Administered 2021-02-02 – 2021-02-06 (×14): 15 mg via ORAL
  Filled 2021-02-01 (×14): qty 3

## 2021-02-01 MED ORDER — DIPHENOXYLATE-ATROPINE 2.5-0.025 MG PO TABS: 1.0000 | ORAL_TABLET | Freq: Four times a day (QID) | ORAL | Status: DC | PRN

## 2021-02-01 MED ORDER — PIPERACILLIN-TAZOBACTAM 3.375 G IVPB
3.3750 g | Freq: Once | INTRAVENOUS | Status: AC
  Administered 2021-02-01: 18:00:00 3.375 g via INTRAVENOUS
  Filled 2021-02-01: qty 50

## 2021-02-01 MED ORDER — FAMOTIDINE 20 MG PO TABS
20.0000 mg | ORAL_TABLET | Freq: Two times a day (BID) | ORAL | Status: DC
  Administered 2021-02-01 – 2021-02-07 (×11): 20 mg via ORAL
  Filled 2021-02-01 (×12): qty 1

## 2021-02-01 MED ORDER — LACTATED RINGERS IV BOLUS
1000.0000 mL | Freq: Once | INTRAVENOUS | Status: AC
  Administered 2021-02-01: 23:00:00 1000 mL via INTRAVENOUS

## 2021-02-01 MED ORDER — ACYCLOVIR 400 MG PO TABS
400.0000 mg | ORAL_TABLET | Freq: Two times a day (BID) | ORAL | Status: DC
  Administered 2021-02-01 – 2021-02-08 (×12): 400 mg via ORAL
  Filled 2021-02-01 (×15): qty 1

## 2021-02-01 MED ORDER — ALLOPURINOL 300 MG PO TABS
300.0000 mg | ORAL_TABLET | Freq: Every day | ORAL | Status: DC
  Administered 2021-02-02 – 2021-02-08 (×7): 300 mg via ORAL
  Filled 2021-02-01 (×6): qty 1
  Filled 2021-02-01: qty 3

## 2021-02-01 MED ORDER — MONTELUKAST SODIUM 10 MG PO TABS
10.0000 mg | ORAL_TABLET | Freq: Every day | ORAL | Status: DC
  Administered 2021-02-02 – 2021-02-05 (×4): 10 mg via ORAL
  Filled 2021-02-01 (×5): qty 1

## 2021-02-01 MED ORDER — ACALABRUTINIB MALEATE 100 MG PO TABS: 100.0000 mg | ORAL_TABLET | Freq: Two times a day (BID) | ORAL | Status: DC

## 2021-02-01 NOTE — ED Provider Notes (Signed)
Emergency Medicine Provider Triage Evaluation Note  Ryan Copeland , a 70 y.o. male  was evaluated in triage.  Pt complains of altered mental status, weakness.  Patient is a history of lung cancer.  He is getting radiation treatment.  He has a suprapubic catheter.  Patient went to his urologist today and had change.  She sent him here for further evaluation.  Patient has had several days of increasing weakness, lethargy.  He has fallen at home without injury.  Suprapubic catheter changed prior to arrival in the emergency department today  Review of Systems  Positive: AMS Negative: COUGH  Physical Exam  There were no vitals taken for this visit. Gen:   Awake, no distress   Resp:  Normal effort  MSK:   Moves extremities without difficulty  Other:  [place] [employer]  Medically screening exam initiated at 4:08 PM.  Appropriate orders placed.  ZAKRY CASO was informed that the remainder of the evaluation will be completed by another provider, this initial triage assessment does not replace that evaluation, and the importance of remaining in the ED until their evaluation is complete.  Altered mental status work-up initiated.   Margarita Mail, PA-C 02/01/21 1612    Regan Lemming, MD 02/01/21 [phone]

## 2021-02-01 NOTE — ED Triage Notes (Addendum)
Pt sent here from urologist for AMS, pt was there to get his suprapubic catheter changed. Per wife, pt has had increase in falls, increased confusion, unsteady w/ ambulation. Pt has had cough, lower back pain, and chills. Pt has lung CA, just finished radiation last week. Hx pneumonia and UTI's

## 2021-02-01 NOTE — H&P (Signed)
History and Physical    Ryan Copeland LPF:[id number] DOB: [birth year] DOA: 02/01/2021  PCP: [name]., PA-C  Patient coming from: Home  I have personally briefly reviewed patient's old medical records in [place]  Chief Complaint: AMS  HPI: Ryan Copeland is a 70 y.o. 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  Wife says he was unsteady on his feet, more confused today. He went to urology today for suprapubic catheter exchange which was performed and nurse there noticed altered mental status. Had temperature of around 99 and BP check which were okay but has been feeling chills. He seemed slower with movement. Wife says each time he gets sick and gets hospitalized this is how he presents.  Had radiation treatment last Tuesday and has had slower movement but this morning was worse. Had two unwitnessed in the past 3 days ago. [place] with his walker but wife unsure although thinks it could be his neuropathy. Wife says this is atypical for him.  Coughing has been worse than usual in the last month. Has more labored breathing in his sleep. No nausea or vomiting. Has chronic diarrhea.   Pt currently alert and oriented only to self and place but is otherwise fully independent at baseline. Complaining of back pain but this is chronic.   ED Course: She was afebrile, initially hypotensive with BP 96/57 but improved after 1L of LR bolus. CBC showed no leukocytosis or anemia. Plt is low but stable at 66.  Absolute neutrophil count normal at 2.3. Sodium 134, K of 4.1, creatinine of 0.98, BG of 115  Chief CT of the head is negative UA is negative  Chest x-ray shows possible left lung base pneumonia. Patient was put on broad-spectrum IV vancomycin and Zosyn while in the ED.  Hospitalist on-call for  admission.  Review of Systems: Unable to obtain given patient's altered mental status   Past Medical History:  Diagnosis Date   Anxiety 01/30/2014   Back injury    lower disc   CAD (coronary artery disease)    CLL (chronic lymphocytic leukemia) ([place]) 03/18/2011   COPD (chronic obstructive pulmonary disease) ([place])    DM type 2 (diabetes mellitus, type 2) ([facility])    ECRB (extensor carpi radialis brevis) tenosynovitis    Foley catheter in place 11/2019   last changed 04-14-2020   GERD (gastroesophageal reflux disease)    takes Nexium if needed   History of blood transfusion 2015   Hyperlipidemia    Insomnia    Long-term current use of intravenous immunoglobulin (IVIG)    q month ivig   MI, acute, non ST segment elevation ([place]) 06/28/2009   with stenting of the LAD   Neuromuscular disorder ([place])    peripheral neuropathy both all toes   Pneumonia 11/2019   10/2020   PVD (peripheral vascular disease) ([place])    Thrombocytopenia ([place])    Tobacco abuse     Past Surgical History:  Procedure Laterality Date   ADENOIDECTOMY  1955   BRONCHIAL BIOPSY  12/08/2020   Procedure: BRONCHIAL BIOPSIES;  Surgeon: [name], DO;  Location: [facility];  Service: Pulmonary;;   BRONCHIAL BRUSHINGS  12/08/2020   Procedure: BRONCHIAL BRUSHINGS;  Surgeon: [name], DO;  Location: [facility];  Service: Pulmonary;;   BRONCHIAL NEEDLE ASPIRATION BIOPSY  12/08/2020   Procedure: BRONCHIAL  NEEDLE ASPIRATION BIOPSIES;  Surgeon: [name], DO;  Location: [facility];  Service: Pulmonary;;   CARDIAC CATHETERIZATION     CARDIAC CATHETERIZATION N/A 09/26/2014   Procedure: Left Heart Cath and Coronary Angiography;  Surgeon: Peter M Martinique, MD;  Location: [facility];  Service: Cardiovascular;  Laterality: N/A;   carpel tunnel release Left 04-1989   carpel tunnel release  Right 01-1989   CHOLECYSTECTOMY  2007   [place]   CORONARY STENT PLACEMENT  06/2009   stent to lad   CYSTOSCOPY  N/A 04/20/2020   Procedure: [place];  Surgeon: Janith Lima, MD;  Location: [facility];  Service: Urology;  Laterality: N/A;  ONLY NEEDS 30 MIN   femoral stents  09/2014   iliac stent   FIDUCIAL MARKER PLACEMENT  12/08/2020   Procedure: FIDUCIAL MARKER PLACEMENT;  Surgeon: [name], DO;  Location: [facility];  Service: Pulmonary;;   IR CV LINE INJECTION  08/18/2017   IR CV LINE INJECTION  09/01/2017   IR CV LINE INJECTION  02/02/2018   LATERAL EPICONDYLE RELEASE Left 02/12/2014   Procedure: LEFT ELBOW DEBRIDEMENT WITH TENDON REPAIR ;  Surgeon: Lorn Junes, MD;  Location: [place];  Service: Orthopedics;  Laterality: Left;   LEFT CAI STENT/PTA AND POPLITEAL ARTERY/TIBIAL THROMBECTOMY      LEFT HEART CATHETERIZATION WITH CORONARY ANGIOGRAM N/A 08/26/2011   Procedure: LEFT HEART CATHETERIZATION WITH CORONARY ANGIOGRAM;  Surgeon: Peter M Martinique, MD;  Location: [facility];  Service: Cardiovascular;  Laterality: N/A;   PERIPHERAL VASCULAR CATHETERIZATION N/A 01/01/2015   Procedure: Abdominal Aortogram;  Surgeon: Conrad Sussex, MD;  Location: [facility];  Service: Cardiovascular;  Laterality: N/A;   port a cath insertion  2014   right    TARSAL TUNNEL RELEASE Bilateral 08-2007   VIDEO BRONCHOSCOPY WITH ENDOBRONCHIAL NAVIGATION Left 12/08/2020   Procedure: VIDEO BRONCHOSCOPY WITH ENDOBRONCHIAL NAVIGATION;  Surgeon: [name], DO;  Location: [facility];  Service: Pulmonary;  Laterality: Left;  ION w/ fiducial placement   VIDEO BRONCHOSCOPY WITH RADIAL ENDOBRONCHIAL ULTRASOUND  12/08/2020   Procedure: RADIAL ENDOBRONCHIAL ULTRASOUND;  Surgeon: [name], DO;  Location: [facility];  Service: Pulmonary;;     reports that he quit smoking about 4 years ago. His smoking use included cigarettes. He has a 44.00 pack-year smoking history. He has never used smokeless tobacco. He reports that he does not currently use alcohol. He  reports that he does not use drugs. Social History  Allergies  Allergen Reactions   Immune Globulin Hives and Rash    Pt reports hives/rash on body with IV Privigen. He was changed to IV Gamunex-C and had many infusions without adverse side effects.    Codeine Hives    Pt states he can take a few, more reaction with extended doses.    Family History  Problem Relation Age of Onset   Lung cancer Mother 38   Cancer Mother        lung   Heart failure Father 41   Heart disease Father      Prior to Admission medications   Medication Sig Start Date End Date Taking? Authorizing Provider  Acalabrutinib Maleate (CALQUENCE) 100 MG TABS Take 1 tablet (100 mg) by mouth 2 (two) times daily. 01/22/21  Yes [name], MD  acetaminophen (TYLENOL) 325 MG tablet Take 2 tablets (650 mg total) by mouth every 6 (six) hours as needed for moderate pain or  fever. 02/20/20  Yes Swayze, Ava, DO  acyclovir (ZOVIRAX) 400 MG tablet TAKE 1 TABLET BY MOUTH TWICE A DAY Patient taking differently: Take 400 mg by mouth 2 (two) times daily. 01/13/21  Yes Gorsuch, Ni, MD  allopurinol (ZYLOPRIM) 300 MG tablet Take 1 tablet (300 mg total) by mouth daily. 01/29/21  Yes Gorsuch, Ni, MD  ALPRAZolam Duanne Moron) 0.5 MG tablet Take 0.5 mg by mouth at bedtime as needed for anxiety. 09/08/20  Yes [provider]  b complex vitamins tablet Take 1 tablet by mouth daily.    Yes [provider]  benzonatate (TESSALON) 100 MG capsule TAKE TWO CAPSULES BY MOUTH THREE TIMES A DAY AS NEEDED Patient taking differently: 100 mg 3 (three) times daily. 01/25/21  Yes Spero Geralds, MD  Cholecalciferol 25 MCG (1000 UT) tablet Take 1,000 Units by mouth daily.    Yes [provider]  diphenoxylate-atropine (LOMOTIL) 2.5-0.025 MG tablet Take 1 tablet by mouth 4 (four) times daily as needed for diarrhea or loose stools. 01/29/21  Yes Gorsuch, Ni, MD  famotidine (PEPCID) 20 MG tablet TAKE 1 TABLET BY MOUTH TWICE A  DAY Patient taking differently: Take 20 mg by mouth 2 (two) times daily. 02/18/20  Yes Gorsuch, Ni, MD  furosemide (LASIX) 20 MG tablet Take 20 mg by mouth daily. 11/25/20  Yes [provider]  HYDROcodone bit-homatropine (HYCODAN) 5-1.5 MG/5ML syrup Take 5 mLs by mouth every 6 (six) hours as needed for cough. 01/13/21  Yes Icard, Octavio Graves, DO  HYDROcodone-acetaminophen (NORCO) 10-325 MG tablet Take 1 tablet by mouth every 6 (six) hours as needed for moderate pain.   Yes [provider]  lidocaine-prilocaine (EMLA) cream Apply 1 application topically as needed (prior chemo/IBIG). Patient taking differently: Apply 1 application topically as needed (prior chemo/IBIG). Prn for mild pain 09/15/20  Yes Gorsuch, Ni, MD  loperamide (IMODIUM) 2 MG capsule Take 2 capsules (4 mg total) by mouth 4 (four) times daily. 07/24/20  Yes Gorsuch, Ernst Spell, MD  meclizine (ANTIVERT) 12.5 MG tablet Take 12.5 mg by mouth 3 (three) times daily as needed for dizziness.   Yes [provider]  metFORMIN (GLUCOPHAGE-XR) 500 MG 24 hr tablet Take 500 mg by mouth every evening.   Yes [provider]  midodrine (PROAMATINE) 5 MG tablet Take 3 tablets (15 mg total) by mouth 3 (three) times daily with meals. 11/06/20  Yes Maryjane Hurter, MD  montelukast (SINGULAIR) 10 MG tablet TAKE 1 TABLET BY MOUTH EVERY DAY Patient taking differently: Take 10 mg by mouth daily. 01/15/21  Yes Spero Geralds, MD  nitroGLYCERIN (NITROSTAT) 0.4 MG SL tablet PLACE 1 TABLET UNDER THE TONGUE EVERY 5 MINUTES X 3 DOSES AS NEEDED FOR CHEST PAIN *MAX 3 DOSES* Patient taking differently: Place 0.4 mg under the tongue every 5 (five) minutes as needed for chest pain. PLACE 1 TABLET UNDER THE TONGUE EVERY 5 MINUTES X 3 DOSES AS NEEDED FOR CHEST PAIN *MAX 3 DOSES* 08/18/20  Yes Freada Bergeron, MD  oxybutynin (DITROPAN) 5 MG tablet Take 5 mg by mouth 2 (two) times daily as needed for bladder spasms. 11/16/20  Yes [provider]  pregabalin (LYRICA) 200 MG capsule TAKE 1 CAPSULE (200 MG TOTAL) BY MOUTH 3 (THREE) TIMES DAILY. 09/02/19  Yes Suzzanne Cloud, NP  rosuvastatin (CRESTOR) 5 MG tablet Take 1 tablet (5 mg total) by mouth daily. 07/09/20  Yes Freada Bergeron, MD  sildenafil (VIAGRA) 100 MG tablet Take 0.5 tablets (  50 mg total) by mouth as needed for erectile dysfunction (30 mintues prior to sexual intercourse). 04/08/20  Yes Burtis Junes, NP  zolpidem (AMBIEN) 10 MG tablet Take 10 mg by mouth at bedtime as needed for sleep. 03/09/20  Yes [provider]  tamsulosin (FLOMAX) 0.4 MG CAPS capsule Take 1 capsule (0.4 mg total) by mouth daily. Patient not taking: Reported on 02/01/2021 12/06/19   Charlynne Cousins, MD    Physical Exam: Vitals:   02/01/21 1608 02/01/21 1807 02/01/21 1900  BP: (!) 96/57 (!) 107/57 (!) 106/54  Pulse: 83 72 70  Resp: 20 14 17   Temp: 98.9 F (37.2 C)    TempSrc: Oral    SpO2: 98% 94% 94%    Constitutional: Drowsy and lethargic appearing male laying flat in bed Vitals:   02/01/21 1608 02/01/21 1807 02/01/21 1900  BP: (!) 96/57 (!) 107/57 (!) 106/54  Pulse: 83 72 70  Resp: 20 14 17   Temp: 98.9 F (37.2 C)    TempSrc: Oral    SpO2: 98% 94% 94%   Eyes: lids and conjunctivae normal ENMT: Mucous membranes are moist.  Neck: normal, supple Respiratory: clear to auscultation bilaterally, no wheezing, no crackles. Normal respiratory effort. No accessory muscle use.  Cardiovascular: Regular rate and rhythm, no murmurs / rubs / gallops. No extremity edema.  Abdomen: no tenderness,  Bowel sounds positive.  Musculoskeletal: no clubbing / cyanosis. No joint deformity upper and lower extremities.Normal muscle tone.  Skin: Port-A-Cath in place to right anterior chest without any surrounding erythema Neurologic: Alert and oriented to self and place only.  Not able to answer other questions. Able to follow commands but would drift off to sleep immediately if  not prompted to stay awake. Psychiatric: Lethargic  Labs on Admission: I have personally reviewed following labs and imaging studies  CBC: Recent Labs  Lab 01/29/21 0948 02/01/21 1617  WBC 11.1* 9.7  NEUTROABS 2.8 2.3  HGB 11.2* 12.0*  HCT 36.1* 38.4*  MCV 88.9 89.7  PLT 68* 66*   Basic Metabolic Panel: Recent Labs  Lab 01/29/21 0948 02/01/21 1617  NA 140 134*  K 4.5 4.1  CL 106 100  CO2 26 26  GLUCOSE 131* 115*  BUN 11 14  CREATININE 0.80 0.98  CALCIUM 8.6* 8.9   GFR: Estimated Creatinine Clearance: 78.1 mL/min (by C-G formula based on SCr of 0.98 mg/dL). Liver Function Tests: Recent Labs  Lab 01/29/21 0948 02/01/21 1617  AST 20 17  ALT 31 25  ALKPHOS 125 86  BILITOT 0.3 0.3  PROT 6.1* 6.6  ALBUMIN 3.6 3.6   No results for input(s): LIPASE, AMYLASE in the last 168 hours. Recent Labs  Lab 02/01/21 1617  AMMONIA 14   Coagulation Profile: No results for input(s): INR, PROTIME in the last 168 hours. Cardiac Enzymes: No results for input(s): CKTOTAL, CKMB, CKMBINDEX, TROPONINI in the last 168 hours. BNP (last 3 results) No results for input(s): PROBNP in the last 8760 hours. HbA1C: No results for input(s): HGBA1C in the last 72 hours. CBG: No results for input(s): GLUCAP in the last 168 hours. Lipid Profile: No results for input(s): CHOL, HDL, LDLCALC, TRIG, CHOLHDL, LDLDIRECT in the last 72 hours. Thyroid Function Tests: No results for input(s): TSH, T4TOTAL, FREET4, T3FREE, THYROIDAB in the last 72 hours. Anemia Panel: No results for input(s): VITAMINB12, FOLATE, FERRITIN, TIBC, IRON, RETICCTPCT in the last 72 hours. Urine analysis:    Component Value Date/Time   COLORURINE YELLOW 02/01/2021 1609  APPEARANCEUR CLEAR 02/01/2021 1609   LABSPEC <1.005 (L) 02/01/2021 1609   PHURINE 5.5 02/01/2021 1609   GLUCOSEU NEGATIVE 02/01/2021 1609   HGBUR SMALL (A) 02/01/2021 1609   BILIRUBINUR NEGATIVE 02/01/2021 [place] 02/01/2021 1609    PROTEINUR NEGATIVE 02/01/2021 1609   UROBILINOGEN 0.2 02/03/2014 1356   NITRITE NEGATIVE 02/01/2021 1609   LEUKOCYTESUR NEGATIVE 02/01/2021 1609    Radiological Exams on Admission: DG Chest 2 View  Result Date: 02/01/2021 CLINICAL DATA:  Altered mental status. EXAM: CHEST - 2 VIEW COMPARISON:  Chest x-ray 12/08/2020. FINDINGS: Right chest port catheter tip projects over the distal SVC. The heart is mildly enlarged, unchanged. There is some minimal interstitial and patchy opacities in both lung bases, left greater than right. There is no pleural effusion or pneumothorax. There is a stable calcified nodule in the right mid lung. No acute fractures are identified. IMPRESSION: 1. Cardiomegaly with mild interstitial edema. Infection is not excluded in the left lung base. Electronically Signed   By: Ronney Asters M.D.   On: 02/01/2021 17:46   CT Head Wo Contrast  Result Date: 02/01/2021 CLINICAL DATA:  Mental status change, unknown cause EXAM: CT HEAD WITHOUT CONTRAST TECHNIQUE: Contiguous axial images were obtained from the base of the skull through the vertex without intravenous contrast. COMPARISON:  CT head 11/02/2020 BRAIN: BRAIN Patchy and confluent areas of decreased attenuation are noted throughout the deep and periventricular white matter of the cerebral hemispheres bilaterally, compatible with chronic microvascular ischemic disease. No evidence of large-territorial acute infarction. No parenchymal hemorrhage. No mass lesion. No extra-axial collection. No mass effect or midline shift. No hydrocephalus. Basilar cisterns are patent. Vascular: No hyperdense vessel. Atherosclerotic calcifications are present within the cavernous internal carotid arteries. Skull: No acute fracture or focal lesion. Sinuses/Orbits: Paranasal sinuses and mastoid air cells are clear. The orbits are unremarkable. Other: None. IMPRESSION: No acute intracranial abnormality. Electronically Signed   By: Iven Finn M.D.    On: 02/01/2021 18:56      Assessment/Plan  Acute metabolic encephalopathy Pt is afebrile with no leukocytosis.  Chest x-ray is concerning for opacity to the left lung base.  Will treat for presumed pneumonia with IV vancomycin and cefepime pending sputum culture, urine antigens  Chronic hypotension -stable -continue home TID midodrine  Chronic indwelling suprapubic catheter -This was exchanged today at urology outpatient -UA negative for UTI  Non-small cell lung cancer, left lung Recently completed radiation therapy.  Has repeat imaging pending in 6 to 8 weeks Follows with Dr. Alvy Bimler oncology  Small cell B-cell [place] while on abx per pharmacy rec- will need to consult oncology whether to resume  History of pancytopenia WBC and hemoglobin actually normal today.  Platelets low at 66K but stable  Hypogammaglobinemia On IVIG therapy monthly  CAD s/p stent to LAD Not on aspirin therapy due to chronic thrombocytopenia  Hyperlipidemia Continue rosuvastatin  Aortic root dilatation Follows with cardiology outpatient and has repeat echocardiogram in 6 to 12 months  DVT prophylaxis:SCD Code Status: DNR- verified with wife Family Communication: Plan discussed with wife over the phone  disposition Plan: Home with observation Consults called:  Admission status: Observation  Level of care: Telemetry Medical  Status is: Observation  The patient remains OBS appropriate and will d/c before 2 midnights.        Orene Desanctis DO [facility]   If 7PM-7AM, please contact night-coverage [email or web address]   02/01/2021, 8:28 PM

## 2021-02-01 NOTE — ED Provider Notes (Signed)
[facility] EMERGENCY DEPARTMENT Provider Note   CSN: [id number] Arrival date & time: 02/01/21  1517     History Chief Complaint  Patient presents with   Altered Mental Status    Ryan Copeland is a 70 y.o. male with a history of CLL on IVIG, COPD, DM2, HLD, CAD s/p PCI, lung cancer s/p radiation therapy who presents with fever and altered mental status.  Over the past few days, patient has had worsening cough and chills.  He has also had 2 ground level falls over the past 3 days.  He did not lose consciousness with either of these falls.  This morning, his wife thought that he was a little bit more confused than usual.  At his urology appointment today for suprapubic catheter exchange, the patient's blood pressure was in the low 591M systolic and he was noted to be febrile 100.7.  Due to confusion, low blood pressure, and fever, the urology office recommended he come to emergency department for further evaluation.  His wife states that he has had similar presentations in the past that were attributable to UTI or pneumonia.    Past Medical History:  Diagnosis Date   Anxiety 01/30/2014   Back injury    lower disc   CAD (coronary artery disease)    CLL (chronic lymphocytic leukemia) ([place]) 03/18/2011   COPD (chronic obstructive pulmonary disease) ([place])    DM type 2 (diabetes mellitus, type 2) (HCC)    ECRB (extensor carpi radialis brevis) tenosynovitis    Foley catheter in place 11/2019   last changed 04-14-2020   GERD (gastroesophageal reflux disease)    takes Nexium if needed   History of blood transfusion 2015   Hyperlipidemia    Insomnia    Long-term current use of intravenous immunoglobulin (IVIG)    q month ivig   MI, acute, non ST segment elevation ([place]) 06/28/2009   with stenting of the LAD   Neuromuscular disorder ([place])    peripheral neuropathy both all toes   Pneumonia 11/2019   10/2020   PVD (peripheral vascular disease) ([place])    Thrombocytopenia  ([place])    Tobacco abuse     Patient Active Problem List   Diagnosis Date Noted   AMS (altered mental status) 02/01/2021   Aortic root dilation ([place]) 01/03/2021   Bilateral leg edema 10/27/2020   Physical debility 10/27/2020   Lung nodule 09/23/2020   Non-small cell carcinoma of lung, left ([place]) 09/18/2020   Head and neck lymphadenopathy 38/46/6599   Complicated UTI (urinary tract infection) 07/18/2020   Gum lesion 05/29/2020   Acute lower UTI 02/16/2020   Bladder pain 02/16/2020   Chronic hypotension 02/16/2020   Vitamin B12 deficiency 01/01/2020   Hypotension due to drugs 01/01/2020   Protein-calorie malnutrition, moderate ([place]) 01/01/2020   Septic shock ([place]) 12/15/2019   Community acquired pneumonia 11/28/2019   Cerebral vascular disease 11/25/2019   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   Other fatigue 10/25/2019   Deficiency anemia 10/25/2019   Dizziness 10/22/2019   Gait abnormality 10/22/2019   Hx of nonmelanoma skin cancer 03/15/2019   Essential hypertension 04/24/2018   Influenza A 04/17/2018   Sepsis ([place]) 04/17/2018   Chronic diastolic CHF (congestive heart failure) ([place]) 04/17/2018   Skin rash 08/07/2017   Port-A-Cath in place 08/04/2017   Chronic apical periodontitis 04/26/2017   Retained dental roots 04/26/2017   Dental caries 04/26/2017   Chronic periodontitis 04/26/2017   Loose, teeth 04/26/2017   Peripheral  polyneuropathy 04/14/2017   COPD mixed type ([place]) 02/23/2017   Chronic sinusitis 01/27/2017   Splenomegaly, congestive, chronic 12/29/2016   Diarrhea 12/01/2016   Poor dentition 12/01/2016   Benign mole 12/01/2016   Pancytopenia, acquired ([place]) 11/03/2016   Odynophagia 01/15/2016   Polycythemia, secondary 06/26/2015   Quality of life palliative care encounter 06/26/2015   Atherosclerosis of extremity with intermittent claudication ([place]) 01/23/2015   Lateral epicondylitis of left elbow    ECRB (extensor carpi  radialis brevis) tenosynovitis    Anxiety 01/30/2014   Preoperative clearance 01/30/2014   Thrombocytopenia ([place]) 01/30/2014   Diabetes mellitus without complication ([place]) 70/35/0093   Hypogammaglobulinemia, acquired ([place]) 12/26/2012   Hereditary and idiopathic peripheral neuropathy 08/20/2012   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[place]) 08/20/2012   CAD (coronary artery disease) 07/29/2011   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   MI, acute, non ST segment elevation (HCC)    Hyperlipidemia    PVD (peripheral vascular disease) ([place])    GERD 09/27/2008   SHOULDER STRAIN, RIGHT 09/27/2008    Past Surgical History:  Procedure Laterality Date   ADENOIDECTOMY  1955   BRONCHIAL BIOPSY  12/08/2020   Procedure: BRONCHIAL BIOPSIES;  Surgeon: [name], DO;  Location: [facility];  Service: Pulmonary;;   BRONCHIAL BRUSHINGS  12/08/2020   Procedure: BRONCHIAL BRUSHINGS;  Surgeon: [name], DO;  Location: [facility];  Service: Pulmonary;;   BRONCHIAL NEEDLE ASPIRATION BIOPSY  12/08/2020   Procedure: BRONCHIAL NEEDLE ASPIRATION BIOPSIES;  Surgeon: [name], DO;  Location: [facility];  Service: Pulmonary;;   CARDIAC CATHETERIZATION     CARDIAC CATHETERIZATION N/A 09/26/2014   Procedure: Left Heart Cath and Coronary Angiography;  Surgeon: Peter M Martinique, MD;  Location: [facility];  Service: Cardiovascular;  Laterality: N/A;   carpel tunnel release Left 04-1989   carpel tunnel release  Right 01-1989   CHOLECYSTECTOMY  2007   lapa   CORONARY STENT PLACEMENT  06/2009   stent to lad   CYSTOSCOPY N/A 04/20/2020   Procedure: [place];  Surgeon: Janith Lima, MD;  [facility];  Service: Urology;  Laterality: N/A;  ONLY NEEDS 30 MIN   femoral stents  09/2014   iliac stent   FIDUCIAL MARKER PLACEMENT  12/08/2020   Procedure: FIDUCIAL MARKER PLACEMENT;  Surgeon: [name], DO;  Location: [facility];  Service: Pulmonary;;   IR CV LINE INJECTION  08/18/2017   IR CV LINE INJECTION  09/01/2017   IR CV LINE INJECTION  02/02/2018   LATERAL EPICONDYLE RELEASE Left 02/12/2014   Procedure: LEFT ELBOW DEBRIDEMENT WITH TENDON REPAIR ;  Surgeon: Lorn Junes, MD;  Location: [facility];  Service: Orthopedics;  Laterality: Left;   LEFT CAI STENT/PTA AND POPLITEAL ARTERY/TIBIAL THROMBECTOMY      LEFT HEART CATHETERIZATION WITH CORONARY ANGIOGRAM N/A 08/26/2011   Procedure: LEFT HEART CATHETERIZATION WITH CORONARY ANGIOGRAM;  Surgeon: Peter M Martinique, MD;  Location: [facility];  Service: Cardiovascular;  Laterality: N/A;   PERIPHERAL VASCULAR CATHETERIZATION N/A 01/01/2015   Procedure: Abdominal Aortogram;  Surgeon: Conrad Pulcifer, MD;  Location: [facility];  Service: Cardiovascular;  Laterality: N/A;   port a cath insertion  2014   right    TARSAL TUNNEL RELEASE Bilateral 08-2007   VIDEO BRONCHOSCOPY WITH ENDOBRONCHIAL NAVIGATION Left 12/08/2020   Procedure: VIDEO BRONCHOSCOPY WITH ENDOBRONCHIAL NAVIGATION;  Surgeon: [name], DO;  Location:  [facility];  Service: Pulmonary;  Laterality: Left;  ION w/ fiducial placement   VIDEO BRONCHOSCOPY WITH RADIAL ENDOBRONCHIAL ULTRASOUND  12/08/2020   Procedure: RADIAL ENDOBRONCHIAL ULTRASOUND;  Surgeon: [name], DO;  Location: [facility];  Service: Pulmonary;;       Family History  Problem Relation Age of Onset   Lung cancer Mother 72   Cancer Mother        lung   Heart failure Father 50   Heart disease Father     Social History   Tobacco Use   Smoking status: Former    Packs/day: 1.00    Years: 44.00    Pack years: 44.00    Types: Cigarettes    Quit date: 04/23/2016    Years since quitting: 4.7   Smokeless tobacco: Never  Vaping Use   Vaping Use: Never used  Substance Use Topics   Alcohol use: Not Currently    Alcohol/week: 0.0 standard drinks   Drug use: No    Home Medications Prior  to Admission medications   Medication Sig Start Date End Date Taking? Authorizing Provider  Acalabrutinib Maleate (CALQUENCE) 100 MG TABS Take 1 tablet (100 mg) by mouth 2 (two) times daily. 01/22/21  Yes [name], MD  acetaminophen (TYLENOL) 325 MG tablet Take 2 tablets (650 mg total) by mouth every 6 (six) hours as needed for moderate pain or fever. 02/20/20  Yes Swayze, Ava, DO  acyclovir (ZOVIRAX) 400 MG tablet TAKE 1 TABLET BY MOUTH TWICE A DAY Patient taking differently: Take 400 mg by mouth 2 (two) times daily. 01/13/21  Yes Gorsuch, Ni, MD  allopurinol (ZYLOPRIM) 300 MG tablet Take 1 tablet (300 mg total) by mouth daily. 01/29/21  Yes Gorsuch, Ni, MD  ALPRAZolam Duanne Moron) 0.5 MG tablet Take 0.5 mg by mouth at bedtime as needed for anxiety. 09/08/20  Yes [provider]  b complex vitamins tablet Take 1 tablet by mouth daily.    Yes [provider]  benzonatate (TESSALON) 100 MG capsule TAKE TWO CAPSULES BY MOUTH THREE TIMES A DAY AS NEEDED Patient taking differently: 100 mg 3 (three) times daily. 01/25/21  Yes Spero Geralds, MD  Cholecalciferol 25 MCG (1000 UT) tablet Take 1,000 Units by mouth daily.    Yes [provider]  diphenoxylate-atropine (LOMOTIL) 2.5-0.025 MG tablet Take 1 tablet by mouth 4 (four) times daily as needed for diarrhea or loose stools. 01/29/21  Yes Gorsuch, Ni, MD  famotidine (PEPCID) 20 MG tablet TAKE 1 TABLET BY MOUTH TWICE A DAY Patient taking differently: Take 20 mg by mouth 2 (two) times daily. 02/18/20  Yes Gorsuch, Ni, MD  furosemide (LASIX) 20 MG tablet Take 20 mg by mouth daily. 11/25/20  Yes [provider]  HYDROcodone bit-homatropine (HYCODAN) 5-1.5 MG/5ML syrup Take 5 mLs by mouth every 6 (six) hours as needed for cough. 01/13/21  Yes Icard, Octavio Graves, DO  HYDROcodone-acetaminophen (NORCO) 10-325 MG tablet Take 1 tablet by mouth every 6 (six) hours as needed for moderate pain.   Yes [provider]   lidocaine-prilocaine (EMLA) cream Apply 1 application topically as needed (prior chemo/IBIG). Patient taking differently: Apply 1 application topically as needed (prior chemo/IBIG). Prn for mild pain 09/15/20  Yes Gorsuch, Ni, MD  loperamide (IMODIUM) 2 MG capsule Take 2 capsules (4 mg total) by mouth 4 (four) times daily. 07/24/20  Yes Gorsuch, Ernst Spell, MD  meclizine (ANTIVERT) 12.5 MG tablet Take 12.5 mg by mouth 3 (three) times daily as needed for  dizziness.   Yes [provider]  metFORMIN (GLUCOPHAGE-XR) 500 MG 24 hr tablet Take 500 mg by mouth every evening.   Yes [provider]  midodrine (PROAMATINE) 5 MG tablet Take 3 tablets (15 mg total) by mouth 3 (three) times daily with meals. 11/06/20  Yes Maryjane Hurter, MD  montelukast (SINGULAIR) 10 MG tablet TAKE 1 TABLET BY MOUTH EVERY DAY Patient taking differently: Take 10 mg by mouth daily. 01/15/21  Yes Spero Geralds, MD  nitroGLYCERIN (NITROSTAT) 0.4 MG SL tablet PLACE 1 TABLET UNDER THE TONGUE EVERY 5 MINUTES X 3 DOSES AS NEEDED FOR CHEST PAIN *MAX 3 DOSES* Patient taking differently: Place 0.4 mg under the tongue every 5 (five) minutes as needed for chest pain. PLACE 1 TABLET UNDER THE TONGUE EVERY 5 MINUTES X 3 DOSES AS NEEDED FOR CHEST PAIN *MAX 3 DOSES* 08/18/20  Yes Freada Bergeron, MD  oxybutynin (DITROPAN) 5 MG tablet Take 5 mg by mouth 2 (two) times daily as needed for bladder spasms. 11/16/20  Yes [provider]  pregabalin (LYRICA) 200 MG capsule TAKE 1 CAPSULE (200 MG TOTAL) BY MOUTH 3 (THREE) TIMES DAILY. 09/02/19  Yes Suzzanne Cloud, NP  rosuvastatin (CRESTOR) 5 MG tablet Take 1 tablet (5 mg total) by mouth daily. 07/09/20  Yes Freada Bergeron, MD  sildenafil (VIAGRA) 100 MG tablet Take 0.5 tablets (50 mg total) by mouth as needed for erectile dysfunction (30 mintues prior to sexual intercourse). 04/08/20  Yes Burtis Junes, NP  zolpidem (AMBIEN) 10 MG tablet Take 10 mg by mouth at bedtime as  needed for sleep. 03/09/20  Yes [provider]  tamsulosin (FLOMAX) 0.4 MG CAPS capsule Take 1 capsule (0.4 mg total) by mouth daily. Patient not taking: Reported on 02/01/2021 12/06/19   Charlynne Cousins, MD    Allergies    Immune globulin and Codeine  Review of Systems   Review of Systems  Constitutional:  Positive for fever. Negative for chills.  HENT:  Negative for congestion, ear pain and sore throat.   Eyes:  Negative for pain and visual disturbance.  Respiratory:  Positive for cough. Negative for shortness of breath.   Cardiovascular:  Negative for chest pain and palpitations.  Gastrointestinal:  Negative for abdominal pain and vomiting.  Genitourinary:  Negative for decreased urine volume, dysuria and hematuria.  Musculoskeletal:  Positive for gait problem. Negative for arthralgias and back pain.  Skin:  Negative for color change and rash.  Neurological:  Negative for seizures and syncope.  Psychiatric/Behavioral:  Positive for confusion.   All other systems reviewed and are negative.  Physical Exam Updated Vital Signs BP (!) 106/54    Pulse 70    Temp 98.9 F (37.2 C) (Oral)    Resp 17    SpO2 94%   Physical Exam Vitals and nursing note reviewed.  Constitutional:      General: He is not in acute distress.    Appearance: He is well-developed.  HENT:     Head: Normocephalic and atraumatic.     Right Ear: External ear normal.     Left Ear: External ear normal.     Nose: Nose normal.     Mouth/Throat:     Mouth: Mucous membranes are moist.     Pharynx: Oropharynx is clear.  Eyes:     Conjunctiva/sclera: Conjunctivae normal.  Cardiovascular:     Rate and Rhythm: Normal rate and regular rhythm.     Heart sounds: No  murmur heard. Pulmonary:     Effort: Pulmonary effort is normal. No respiratory distress.     Breath sounds: Examination of the left-lower field reveals decreased breath sounds. Decreased breath sounds present.  Abdominal:     General:  There is no distension.     Palpations: Abdomen is soft.     Tenderness: There is no abdominal tenderness. There is no guarding or rebound.     Comments: Suprapubic catheter in place draining yellow urine. No signs of infection.  Musculoskeletal:        General: No swelling, deformity or signs of injury.     Cervical back: Neck supple.     Right lower leg: No edema.     Left lower leg: No edema.  Skin:    General: Skin is warm and dry.     Capillary Refill: Capillary refill takes less than 2 seconds.  Neurological:     General: No focal deficit present.     Mental Status: He is alert.     GCS: GCS eye subscore is 4. GCS verbal subscore is 4. GCS motor subscore is 6.     Sensory: No sensory deficit.     Motor: No weakness.     Comments: Oriented to person and place. Thought the year was 2020.  Psychiatric:        Mood and Affect: Mood normal.    ED Results / Procedures / Treatments   Labs (all labs ordered are listed, but only abnormal results are displayed) Labs Reviewed  CBC WITH DIFFERENTIAL/PLATELET - Abnormal; Notable for the following components:      Result Value   Hemoglobin 12.0 (*)    HCT 38.4 (*)    RDW 17.1 (*)    Platelets 66 (*)    Lymphs Abs 6.7 (*)    All other components within normal limits  COMPREHENSIVE METABOLIC PANEL - Abnormal; Notable for the following components:   Sodium 134 (*)    Glucose, Bld 115 (*)    All other components within normal limits  URINALYSIS, ROUTINE W REFLEX MICROSCOPIC - Abnormal; Notable for the following components:   Specific Gravity, Urine <1.005 (*)    Hgb urine dipstick SMALL (*)    All other components within normal limits  URINALYSIS, MICROSCOPIC (REFLEX) - Abnormal; Notable for the following components:   Bacteria, UA RARE (*)    All other components within normal limits  RESP PANEL BY RT-PCR (FLU A&B, COVID) ARPGX2  CULTURE, BLOOD (ROUTINE X 2)  CULTURE, BLOOD (ROUTINE X 2)  EXPECTORATED SPUTUM ASSESSMENT W GRAM  STAIN, RFLX TO RESP C  AMMONIA  LACTIC ACID, PLASMA  CBC  BASIC METABOLIC PANEL  STREP PNEUMONIAE URINARY ANTIGEN  LEGIONELLA PNEUMOPHILA SEROGP 1 UR AG  CBG MONITORING, ED    EKG None  Radiology DG Chest 2 View  Result Date: 02/01/2021 CLINICAL DATA:  Altered mental status. EXAM: CHEST - 2 VIEW COMPARISON:  Chest x-ray 12/08/2020. FINDINGS: Right chest port catheter tip projects over the distal SVC. The heart is mildly enlarged, unchanged. There is some minimal interstitial and patchy opacities in both lung bases, left greater than right. There is no pleural effusion or pneumothorax. There is a stable calcified nodule in the right mid lung. No acute fractures are identified. IMPRESSION: 1. Cardiomegaly with mild interstitial edema. Infection is not excluded in the left lung base. Electronically Signed   By: Ronney Asters M.D.   On: 02/01/2021 17:46   CT Head Wo Contrast  Result  Date: 02/01/2021 CLINICAL DATA:  Mental status change, unknown cause EXAM: CT HEAD WITHOUT CONTRAST TECHNIQUE: Contiguous axial images were obtained from the base of the skull through the vertex without intravenous contrast. COMPARISON:  CT head 11/02/2020 BRAIN: BRAIN Patchy and confluent areas of decreased attenuation are noted throughout the deep and periventricular white matter of the cerebral hemispheres bilaterally, compatible with chronic microvascular ischemic disease. No evidence of large-territorial acute infarction. No parenchymal hemorrhage. No mass lesion. No extra-axial collection. No mass effect or midline shift. No hydrocephalus. Basilar cisterns are patent. Vascular: No hyperdense vessel. Atherosclerotic calcifications are present within the cavernous internal carotid arteries. Skull: No acute fracture or focal lesion. Sinuses/Orbits: Paranasal sinuses and mastoid air cells are clear. The orbits are unremarkable. Other: None. IMPRESSION: No acute intracranial abnormality. Electronically Signed   By:  Iven Finn M.D.   On: 02/01/2021 18:56    Procedures Procedures   Medications Ordered in ED Medications  lactated ringers bolus 1,000 mL (has no administration in time range)  piperacillin-tazobactam (ZOSYN) IVPB 3.375 g (3.375 g Intravenous New Bag/Given 02/01/21 1821)  acetaminophen (TYLENOL) tablet 650 mg (has no administration in time range)  allopurinol (ZYLOPRIM) tablet 300 mg (has no administration in time range)  acyclovir (ZOVIRAX) tablet 400 mg (has no administration in time range)  Acalabrutinib Maleate TABS 100 mg (has no administration in time range)  midodrine (PROAMATINE) tablet 15 mg (has no administration in time range)  rosuvastatin (CRESTOR) tablet 5 mg (has no administration in time range)  diphenoxylate-atropine (LOMOTIL) 2.5-0.025 MG per tablet 1 tablet (has no administration in time range)  famotidine (PEPCID) tablet 20 mg (has no administration in time range)  oxybutynin (DITROPAN) tablet 5 mg (has no administration in time range)  pregabalin (LYRICA) capsule 200 mg (has no administration in time range)  montelukast (SINGULAIR) tablet 10 mg (has no administration in time range)  benzonatate (TESSALON) capsule 100 mg (has no administration in time range)  vancomycin (VANCOREADY) IVPB 1750 mg/350 mL (1,750 mg Intravenous New Bag/Given 02/01/21 1927)    ED Course  I have reviewed the triage vital signs and the nursing notes.  Pertinent labs & imaging results that were available during my care of the patient were reviewed by me and considered in my medical decision making (see chart for details).    MDM Rules/Calculators/A&P                          Patient's presentation concerning for infection causing confusion and gait instability.  Given patient's cough, suspect viral URI or pneumonia.  Patient is afebrile here, but was reportedly febrile in the urology office.  His systolic blood pressure is in the 90s on arrival.  Broad infectious and altered mental  status work-up ordered including CT head, CXR, blood cultures, CBC, CMP, UA, lactic, COVID/flu.  Given patient's immunocompromise status, I have ordered empiric antibiotic coverage with Rocephin and Zosyn.  Fluid bolus ordered for low blood pressure and concern for sepsis.  CT head is negative.  No leukocytosis on CBC.  No significant electrolyte abnormalities or renal dysfunction on CMP.  CXR reviewed by myself shows patchy opacity in the left lung base concerning for pneumonia.  UA negative for UTI.  Work-up is consistent with pneumonia.  Discussed the patient with Dr. Flossie Buffy who will admit the patient to hospitalist medicine service.  Final Clinical Impression(s) / ED Diagnoses Final diagnoses:  Community acquired pneumonia of right lower lobe of lung  Rx / DC Orders ED Discharge Orders     None        Masako Overall, Amalia Hailey, MD 02/01/21 2330    Isla Pence, MD 02/02/21 [phone]

## 2021-02-02 ENCOUNTER — Other Ambulatory Visit: Payer: Self-pay

## 2021-02-02 ENCOUNTER — Encounter (HOSPITAL_COMMUNITY): Payer: Self-pay | Admitting: Family Medicine

## 2021-02-02 ENCOUNTER — Observation Stay (HOSPITAL_COMMUNITY): Payer: Medicare Other

## 2021-02-02 DIAGNOSIS — Z885 Allergy status to narcotic agent status: Secondary | ICD-10-CM | POA: Diagnosis not present

## 2021-02-02 DIAGNOSIS — Z888 Allergy status to other drugs, medicaments and biological substances status: Secondary | ICD-10-CM | POA: Diagnosis not present

## 2021-02-02 DIAGNOSIS — G9341 Metabolic encephalopathy: Secondary | ICD-10-CM | POA: Diagnosis not present

## 2021-02-02 DIAGNOSIS — Z9359 Other cystostomy status: Secondary | ICD-10-CM | POA: Diagnosis not present

## 2021-02-02 DIAGNOSIS — D696 Thrombocytopenia, unspecified: Secondary | ICD-10-CM | POA: Diagnosis present

## 2021-02-02 DIAGNOSIS — E1151 Type 2 diabetes mellitus with diabetic peripheral angiopathy without gangrene: Secondary | ICD-10-CM | POA: Diagnosis present

## 2021-02-02 DIAGNOSIS — Z20822 Contact with and (suspected) exposure to covid-19: Secondary | ICD-10-CM | POA: Diagnosis present

## 2021-02-02 DIAGNOSIS — C8308 Small cell B-cell lymphoma, lymph nodes of multiple sites: Secondary | ICD-10-CM | POA: Diagnosis present

## 2021-02-02 DIAGNOSIS — R401 Stupor: Secondary | ICD-10-CM | POA: Diagnosis not present

## 2021-02-02 DIAGNOSIS — Z66 Do not resuscitate: Secondary | ICD-10-CM | POA: Diagnosis present

## 2021-02-02 DIAGNOSIS — K219 Gastro-esophageal reflux disease without esophagitis: Secondary | ICD-10-CM | POA: Diagnosis present

## 2021-02-02 DIAGNOSIS — Z515 Encounter for palliative care: Secondary | ICD-10-CM | POA: Diagnosis not present

## 2021-02-02 DIAGNOSIS — Z960 Presence of urogenital implants: Secondary | ICD-10-CM

## 2021-02-02 DIAGNOSIS — R410 Disorientation, unspecified: Secondary | ICD-10-CM | POA: Diagnosis not present

## 2021-02-02 DIAGNOSIS — I2510 Atherosclerotic heart disease of native coronary artery without angina pectoris: Secondary | ICD-10-CM | POA: Diagnosis present

## 2021-02-02 DIAGNOSIS — G928 Other toxic encephalopathy: Secondary | ICD-10-CM | POA: Diagnosis present

## 2021-02-02 DIAGNOSIS — I77810 Thoracic aortic ectasia: Secondary | ICD-10-CM | POA: Diagnosis present

## 2021-02-02 DIAGNOSIS — E785 Hyperlipidemia, unspecified: Secondary | ICD-10-CM | POA: Diagnosis present

## 2021-02-02 DIAGNOSIS — M5440 Lumbago with sciatica, unspecified side: Secondary | ICD-10-CM

## 2021-02-02 DIAGNOSIS — I252 Old myocardial infarction: Secondary | ICD-10-CM | POA: Diagnosis not present

## 2021-02-02 DIAGNOSIS — Z923 Personal history of irradiation: Secondary | ICD-10-CM | POA: Diagnosis not present

## 2021-02-02 DIAGNOSIS — E876 Hypokalemia: Secondary | ICD-10-CM | POA: Diagnosis present

## 2021-02-02 DIAGNOSIS — D801 Nonfamilial hypogammaglobulinemia: Secondary | ICD-10-CM | POA: Diagnosis present

## 2021-02-02 DIAGNOSIS — M549 Dorsalgia, unspecified: Secondary | ICD-10-CM | POA: Diagnosis present

## 2021-02-02 DIAGNOSIS — R001 Bradycardia, unspecified: Secondary | ICD-10-CM | POA: Diagnosis not present

## 2021-02-02 DIAGNOSIS — C3492 Malignant neoplasm of unspecified part of left bronchus or lung: Secondary | ICD-10-CM | POA: Diagnosis present

## 2021-02-02 DIAGNOSIS — C7951 Secondary malignant neoplasm of bone: Secondary | ICD-10-CM | POA: Diagnosis present

## 2021-02-02 DIAGNOSIS — E1142 Type 2 diabetes mellitus with diabetic polyneuropathy: Secondary | ICD-10-CM | POA: Diagnosis present

## 2021-02-02 DIAGNOSIS — J189 Pneumonia, unspecified organism: Secondary | ICD-10-CM | POA: Diagnosis present

## 2021-02-02 DIAGNOSIS — I9589 Other hypotension: Secondary | ICD-10-CM | POA: Diagnosis present

## 2021-02-02 DIAGNOSIS — M109 Gout, unspecified: Secondary | ICD-10-CM | POA: Diagnosis present

## 2021-02-02 LAB — BASIC METABOLIC PANEL
Anion gap: 6 (ref 5–15)
BUN: 14 mg/dL (ref 8–23)
CO2: 25 mmol/L (ref 22–32)
Calcium: 8.5 mg/dL — ABNORMAL LOW (ref 8.9–10.3)
Chloride: 105 mmol/L (ref 98–111)
Creatinine, Ser: 0.87 mg/dL (ref 0.61–1.24)
GFR, Estimated: >60 mL/min (ref >60)
Glucose, Bld: 80 mg/dL (ref 70–99)
Potassium: 5.7 mmol/L — ABNORMAL HIGH (ref 3.5–5.1)
Sodium: 136 mmol/L (ref 135–145)

## 2021-02-02 LAB — C-REACTIVE PROTEIN: CRP: 3.7 mg/dL — ABNORMAL HIGH (ref <1.0)

## 2021-02-02 LAB — MAGNESIUM: Magnesium: 2 mg/dL (ref 1.7–2.4)

## 2021-02-02 LAB — STREP PNEUMONIAE URINARY ANTIGEN: Strep Pneumo Urinary Antigen: NEGATIVE

## 2021-02-02 LAB — CBC
HCT: 36.3 % — ABNORMAL LOW (ref 39.0–52.0)
Hemoglobin: 11.6 g/dL — ABNORMAL LOW (ref 13.0–17.0)
MCH: 28.7 pg (ref 26.0–34.0)
MCHC: 32 g/dL (ref 30.0–36.0)
MCV: 89.9 fL (ref 80.0–100.0)
Platelets: 54 10*3/uL — ABNORMAL LOW (ref 150–400)
RBC: 4.04 MIL/uL — ABNORMAL LOW (ref 4.22–5.81)
RDW: 17.2 % — ABNORMAL HIGH (ref 11.5–15.5)
WBC: 10.1 10*3/uL (ref 4.0–10.5)
nRBC: 0.2 % (ref 0.0–0.2)

## 2021-02-02 LAB — BRAIN NATRIURETIC PEPTIDE: B Natriuretic Peptide: 96.8 pg/mL (ref 0.0–100.0)

## 2021-02-02 LAB — PROCALCITONIN: Procalcitonin: <0.1 ng/mL

## 2021-02-02 MED ORDER — SODIUM ZIRCONIUM CYCLOSILICATE 10 G PO PACK
10.0000 g | PACK | Freq: Three times a day (TID) | ORAL | Status: AC
  Administered 2021-02-02 (×3): 10 g via ORAL
  Filled 2021-02-02 (×3): qty 1

## 2021-02-02 MED ORDER — VANCOMYCIN HCL IN DEXTROSE 1-5 GM/200ML-% IV SOLN
1000.0000 mg | Freq: Two times a day (BID) | INTRAVENOUS | Status: DC
  Administered 2021-02-02 – 2021-02-03 (×4): 1000 mg via INTRAVENOUS
  Filled 2021-02-02 (×6): qty 200

## 2021-02-02 MED ORDER — CHLORHEXIDINE GLUCONATE CLOTH 2 % EX PADS
6.0000 | MEDICATED_PAD | Freq: Every day | CUTANEOUS | Status: DC
  Administered 2021-02-03 – 2021-02-06 (×3): 6 via TOPICAL

## 2021-02-02 MED ORDER — SODIUM CHLORIDE 0.9% FLUSH
10.0000 mL | INTRAVENOUS | Status: DC | PRN
  Administered 2021-02-05: 10 mL

## 2021-02-02 MED ORDER — LACTATED RINGERS IV SOLN: INTRAVENOUS | Status: AC

## 2021-02-02 MED ORDER — HEPARIN SODIUM (PORCINE) 5000 UNIT/ML IJ SOLN: 5000.0000 [IU] | Freq: Three times a day (TID) | INTRAMUSCULAR | Status: DC

## 2021-02-02 MED ORDER — SODIUM CHLORIDE 0.9 % IV SOLN
2.0000 g | Freq: Three times a day (TID) | INTRAVENOUS | Status: DC
  Administered 2021-02-02 – 2021-02-05 (×11): 2 g via INTRAVENOUS
  Filled 2021-02-02 (×11): qty 2

## 2021-02-02 MED ORDER — HYDROCODONE-ACETAMINOPHEN 5-325 MG PO TABS
1.0000 | ORAL_TABLET | ORAL | Status: DC | PRN
  Administered 2021-02-02: 15:00:00 1 via ORAL
  Filled 2021-02-02: qty 1

## 2021-02-02 NOTE — ED Notes (Signed)
Patient refused blood draw by RN [place].

## 2021-02-02 NOTE — ED Notes (Signed)
Patient transported to CT 

## 2021-02-02 NOTE — Evaluation (Signed)
Clinical/Bedside Swallow Evaluation Patient Details  Name: Ryan Copeland MRN: [id number] Date of Birth: 02/16/1950  Today's Date: 02/02/2021 Time: SLP Start Time (ACUTE ONLY): 1150 SLP Stop Time (ACUTE ONLY): 1210 SLP Time Calculation (min) (ACUTE ONLY): 20 min  Past Medical History:  Past Medical History:  Diagnosis Date   Anxiety 01/30/2014   Back injury    lower disc   CAD (coronary artery disease)    CLL (chronic lymphocytic leukemia) ([place]) 03/18/2011   COPD (chronic obstructive pulmonary disease) ([place])    DM type 2 (diabetes mellitus, type 2) (HCC)    ECRB (extensor carpi radialis brevis) tenosynovitis    Foley catheter in place 11/2019   last changed 04-14-2020   GERD (gastroesophageal reflux disease)    takes Nexium if needed   History of blood transfusion 2015   Hyperlipidemia    Insomnia    Long-term current use of intravenous immunoglobulin (IVIG)    q month ivig   MI, acute, non ST segment elevation ([place]) 06/28/2009   with stenting of the LAD   Neuromuscular disorder ([place])    peripheral neuropathy both all toes   Pneumonia 11/2019   10/2020   PVD (peripheral vascular disease) ([place])    Thrombocytopenia ([place])    Tobacco abuse    Past Surgical History:  Past Surgical History:  Procedure Laterality Date   ADENOIDECTOMY  1955   BRONCHIAL BIOPSY  12/08/2020   Procedure: BRONCHIAL BIOPSIES;  Surgeon: [name], DO;  Location: [facility];  Service: Pulmonary;;   BRONCHIAL BRUSHINGS  12/08/2020   Procedure: BRONCHIAL BRUSHINGS;  Surgeon: [name], DO;  Location: [facility];  Service: Pulmonary;;   BRONCHIAL NEEDLE ASPIRATION BIOPSY  12/08/2020   Procedure: BRONCHIAL NEEDLE ASPIRATION BIOPSIES;  Surgeon: [name], DO;  Location: [facility];  Service: Pulmonary;;   CARDIAC CATHETERIZATION     CARDIAC CATHETERIZATION N/A 09/26/2014   Procedure: Left Heart Cath and Coronary Angiography;  Surgeon: Peter M Martinique, MD;  Location: [facility];  Service: Cardiovascular;  Laterality: N/A;   carpel tunnel release Left 04-1989   carpel tunnel release  Right 01-1989   CHOLECYSTECTOMY  2007   [place]   CORONARY STENT PLACEMENT  06/2009   stent to lad   CYSTOSCOPY N/A 04/20/2020   Procedure: [place];  Surgeon: Janith Lima, MD;  Location: [facility];  Service: Urology;  Laterality: N/A;  ONLY NEEDS 30 MIN   femoral stents  09/2014   iliac stent   FIDUCIAL MARKER PLACEMENT  12/08/2020   Procedure: FIDUCIAL MARKER PLACEMENT;  Surgeon: [name], DO;  Location: [facility];  Service: Pulmonary;;   IR CV LINE INJECTION  08/18/2017   IR CV LINE INJECTION  09/01/2017   IR CV LINE INJECTION  02/02/2018   LATERAL EPICONDYLE RELEASE Left 02/12/2014   Procedure: LEFT ELBOW DEBRIDEMENT WITH TENDON REPAIR ;  Surgeon: Lorn Junes, MD;  Location: [facility];  Service: Orthopedics;  Laterality: Left;   LEFT CAI STENT/PTA AND POPLITEAL ARTERY/TIBIAL THROMBECTOMY      LEFT HEART CATHETERIZATION WITH CORONARY ANGIOGRAM N/A 08/26/2011   Procedure: LEFT HEART CATHETERIZATION WITH CORONARY ANGIOGRAM;  Surgeon: Peter M Martinique, MD;  Location: [facility];  Service: Cardiovascular;  Laterality: N/A;   PERIPHERAL VASCULAR CATHETERIZATION N/A 01/01/2015   Procedure: Abdominal Aortogram;  Surgeon: Conrad [facility], MD;  Location: [facility];  Service: Cardiovascular;  Laterality: N/A;   port a cath  insertion  2014   right    TARSAL TUNNEL RELEASE Bilateral 08-2007   VIDEO BRONCHOSCOPY WITH ENDOBRONCHIAL NAVIGATION Left 12/08/2020   Procedure: VIDEO BRONCHOSCOPY WITH ENDOBRONCHIAL NAVIGATION;  Surgeon: [name], DO;  Location: [facility];  Service: Pulmonary;  Laterality: Left;  ION w/ fiducial placement   VIDEO BRONCHOSCOPY WITH RADIAL ENDOBRONCHIAL ULTRASOUND  12/08/2020   Procedure: RADIAL ENDOBRONCHIAL ULTRASOUND;  Surgeon: [name], DO;  Location: [facility];  Service: Pulmonary;;    HPI:  Pt is a 70 yo male presenting with AMS due to PNA. Previous clinical evaluation of swallowing in October 2021 recommended Dys 2 diet and thin liquids given impact of cognition on oral preparation. He was upgraded to regular solids prior to d/c and when evaluated in September 2022 he elected to have a soft diet in light of edentulous status.  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 He is edentulous and prefers foods that are slightly softer but when reviewing diet options, he would like to remain on regular textures so that he may select the types of food he may eat at baseline. SLP to update order for diet and s/o. Please reorder with any acute changes. SLP Visit Diagnosis: Dysphagia, unspecified (R13.10)    Aspiration Risk  Mild aspiration risk    Diet Recommendation Regular;Thin liquid   Liquid Administration via: Cup;Straw Medication Administration: Whole meds with liquid Supervision: Patient able to self feed Compensations: Slow rate;Small sips/bites Postural Changes: Seated upright at 90 degrees    Other  Recommendations Oral Care Recommendations: Oral care BID    Recommendations for follow up therapy are one component of a multi-disciplinary discharge planning process, led by the attending physician.  Recommendations may be updated based on patient status, additional functional criteria and insurance authorization.  Follow up Recommendations No SLP follow up      Assistance Recommended at Discharge None  Functional Status Assessment Patient has not had a recent decline in their functional status  Frequency and Duration             Prognosis Prognosis for Safe Diet Advancement: Good      Swallow Study   General HPI: Pt is a 70 yo male presenting with AMS due to PNA. Previous clinical evaluation of swallowing in October 2021 recommended Dys 2 diet and thin liquids given impact of cognition on oral preparation. He was upgraded to regular solids prior to d/c and when evaluated in September 2022 he elected to have a soft diet in light of edentulous status.  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, GERD, COPD, DMII, PNA Type of Study: Bedside Swallow Evaluation Previous Swallow Assessment: see HPI Diet Prior to this Study: Dysphagia 3 (soft);Thin liquids Temperature Spikes Noted: No Respiratory Status: Room air History of Recent Intubation: No Behavior/Cognition: Alert;Cooperative;Pleasant mood Oral Cavity Assessment: Within Functional Limits Oral Care Completed by SLP: No Oral Cavity - Dentition: Edentulous Vision: Functional for self-feeding Self-Feeding Abilities: Able to feed self Patient Positioning: Upright in bed Baseline Vocal Quality: Normal Volitional Cough: Strong;Congested Volitional Swallow: Able to elicit    Oral/Motor/Sensory Function Overall Oral Motor/Sensory  Function: Within functional limits   Ice Chips Ice chips: Not tested   Thin Liquid Thin Liquid: Within functional limits Presentation: Cup;Self Fed;Straw    Nectar Thick Nectar Thick Liquid: Not tested   Honey Thick Honey Thick Liquid: Not tested   Puree Puree: Not tested (none available in ED)   Solid     Solid: Within functional limits Presentation: Self Fed      Osie Bond., M.A. [place] Acute Rehabilitation Services Pager [phone] Office [phone]  02/02/2021,12:20 PM

## 2021-02-02 NOTE — Progress Notes (Signed)
Pharmacy Antibiotic Note  Ryan Copeland is a 70 y.o. male admitted on 02/01/2021 with pneumonia.  Pharmacy has been consulted for Vancomycin/Cefepime dosing. WBC 9.7. Renal function ok.   Plan: Vancomycin 1000 mg IV q12h >>>Estimated AUC: 515 Cefepime 2g IV q8h Trend WBC, temp, renal function  F/U infectious work-up Drug levels as indicated   Temp (24hrs), Avg:99.4 F (37.4 C), Min:98.9 F (37.2 C), Max:99.8 F (37.7 C)  Recent Labs  Lab 01/29/21 0948 02/01/21 1617 02/01/21 1618  WBC 11.1* 9.7  --   CREATININE 0.80 0.98  --   LATICACIDVEN  --   --  1.2    Estimated Creatinine Clearance: 78.1 mL/min (by C-G formula based on SCr of 0.98 mg/dL).    Allergies  Allergen Reactions   Immune Globulin Hives and Rash    Pt reports hives/rash on body with IV Privigen. He was changed to IV Gamunex-C and had many infusions without adverse side effects.    Codeine Hives    Pt states he can take a few, more reaction with extended doses.    Narda Bonds, PharmD, BCPS Clinical Pharmacist Phone: [phone]

## 2021-02-02 NOTE — Progress Notes (Signed)
New Admission Note:  Arrival Method: Stretcher Mental Orientation: Alert and oriented x 4 Telemetry: Box 19 Assessment: Completed Skin: Warm and dry. Flaky IV: Right Port Pain: Denies Tubes: Supra pubic to leg bag  Safety Measures: Safety Fall Prevention Plan initiated.  Admission: Completed 5 M  Orientation: Patient has been orientated to the room, unit and the staff. Welcome booklet given.  Family: None  Orders have been reviewed and implemented. Will continue to monitor the patient. Call light has been placed within reach and bed alarm has been activated.   Sima Matas BSN, RN  Phone Number: [phone]

## 2021-02-02 NOTE — Evaluation (Signed)
Physical Therapy Evaluation Patient Details Name: Ryan Copeland MRN: [id number] DOB: 12/11/1950 Today's Date: 02/02/2021  History of Present Illness  Pt is 70 y/o M presenting with AMS. 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  Clinical Impression  Patient presents with decreased mobility due to generalized weakness and he will benefit from skilled PT in the acute setting to ensure safety and ability to negotiate 11 steps for home entry.  Wife not present on eveal, but per pt report available to help at d/c.  Likely not to need follow up PT at d/c.        Recommendations for follow up therapy are one component of a multi-disciplinary discharge planning process, led by the attending physician.  Recommendations may be updated based on patient status, additional functional criteria and insurance authorization.  Follow Up Recommendations No PT follow up    Assistance Recommended at Discharge Intermittent Supervision/Assistance  Functional Status Assessment Patient has had a recent decline in their functional status and demonstrates the ability to make significant improvements in function in a reasonable and predictable amount of time.  Equipment Recommendations  None recommended by PT    Recommendations for Other Services       Precautions / Restrictions Precautions Precautions: Fall      Mobility  Bed Mobility Overal bed mobility: Modified Independent             General bed mobility comments: used rails on stretcher, increased time    Transfers Overall transfer level: Needs assistance Equipment used: Rolling walker (2 wheels) Transfers: Sit to/from Stand Sit to Stand: Supervision           General transfer comment: up from stretcher in ED    Ambulation/Gait Ambulation/Gait assistance: Supervision;Min guard Gait Distance (Feet): 60 Feet Assistive  device: Rolling walker (2 wheels) Gait Pattern/deviations: Step-through pattern;Decreased stride length       General Gait Details: back and forth in room x 3 with RW and A for IV/O2  Stairs            Wheelchair Mobility    Modified Rankin (Stroke Patients Only)       Balance Overall balance assessment: Needs assistance   Sitting balance-Leahy Scale: Good     Standing balance support: No upper extremity supported Standing balance-Leahy Scale: Fair Standing balance comment: can stand without UE support, but used walker in room for ambulation                             Pertinent Vitals/Pain Pain Assessment: 0-10 Pain Score: 8  Pain Location: back Pain Descriptors / Indicators: Aching Pain Intervention(s): Repositioned    Home Living Family/patient expects to be discharged to:: Private residence Living Arrangements: Spouse/significant other Available Help at Discharge: Family;Available 24 hours/day Type of Home: House Home Access: Stairs to enter Entrance Stairs-Rails: Left Entrance Stairs-Number of Steps: 11   Home Layout: One level Home Equipment: Conservation officer, nature (2 wheels);Shower seat Additional Comments: no falls or close calls- very independent and does what he wants, very active    Prior Function Prior Level of Function : Independent/Modified Independent;Driving                     Hand Dominance   Dominant Hand: Right    Extremity/Trunk Assessment   Upper Extremity Assessment Upper Extremity Assessment: Overall WFL for tasks  assessed    Lower Extremity Assessment Lower Extremity Assessment: Overall WFL for tasks assessed       Communication   Communication: No difficulties  Cognition Arousal/Alertness: Awake/alert Behavior During Therapy: WFL for tasks assessed/performed Overall Cognitive Status: Impaired/Different from baseline Area of Impairment: Problem solving;Attention                   Current  Attention Level: Selective         Problem Solving: Slow processing;Requires verbal cues General Comments: increased time for responding to questions, admits wishing his wife were there to answer        General Comments General comments (skin integrity, edema, etc.): suprapubic catheter with leg bag    Exercises     Assessment/Plan    PT Assessment Patient needs continued PT services  PT Problem List Decreased strength;Decreased mobility;Decreased activity tolerance;Decreased balance;Pain       PT Treatment Interventions DME instruction;Therapeutic activities;Gait training;Therapeutic exercise;Stair training;Functional mobility training    PT Goals (Current goals can be found in the Care Plan section)  Acute Rehab PT Goals Patient Stated Goal: to get up to a room PT Goal Formulation: With patient Time For Goal Achievement: 02/16/21 Potential to Achieve Goals: Good    Frequency Min 3X/week   Barriers to discharge        Co-evaluation               AM-PAC PT "6 Clicks" Mobility  Outcome Measure Help needed turning from your back to your side while in a flat bed without using bedrails?: None Help needed moving from lying on your back to sitting on the side of a flat bed without using bedrails?: A Little Help needed moving to and from a bed to a chair (including a wheelchair)?: A Little Help needed standing up from a chair using your arms (e.g., wheelchair or bedside chair)?: A Little Help needed to walk in hospital room?: A Little Help needed climbing 3-5 steps with a railing? : A Little 6 Click Score: 19    End of Session Equipment Utilized During Treatment: Oxygen Activity Tolerance: Patient tolerated treatment well Patient left: in chair;with call bell/phone within reach   PT Visit Diagnosis: Muscle weakness (generalized) (M62.81)    Time: 8177-1165 PT Time Calculation (min) (ACUTE ONLY): 21 min   Charges:   PT Evaluation $PT Eval Low Complexity: 1  Low          Magda Kiel, PT Acute Rehabilitation Services Pager:[phone] Office:[phone] 02/02/2021   Reginia Naas 02/02/2021, 4:50 PM

## 2021-02-02 NOTE — Evaluation (Signed)
Occupational Therapy Evaluation Patient Details Name: Ryan Copeland MRN: [id number] DOB: 11/27/1950 Today's Date: 02/02/2021   History of Present Illness Pt is 70 y/o M presenting with AMS. 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   Clinical Impression   Pt presents with decreased balance and lethargy. Currently requiring supervision - Min guard for ADLs and functional transfers/mobility. Pt independent at baseline and lives with wife who will be available to provide assistance at home. Suspect pt is primarily limited by lethargy at time of eval and will be safe to return home without any further skilled OT services once medically cleared. Will follow acutely.      Recommendations for follow up therapy are one component of a multi-disciplinary discharge planning process, led by the attending physician.  Recommendations may be updated based on patient status, additional functional criteria and insurance authorization.   Follow Up Recommendations  No OT follow up    Assistance Recommended at Discharge Set up Supervision/Assistance  Functional Status Assessment  Patient has had a recent decline in their functional status and demonstrates the ability to make significant improvements in function in a reasonable and predictable amount of time.  Equipment Recommendations  None recommended by OT    Recommendations for Other Services       Precautions / Restrictions Precautions Precautions: Fall Restrictions Weight Bearing Restrictions: No      Mobility Bed Mobility Overal bed mobility: Modified Independent                  Transfers Overall transfer level: Needs assistance Equipment used: None Transfers: Sit to/from Stand Sit to Stand: Min guard                  Balance Overall balance assessment: Needs assistance Sitting-balance support: No upper  extremity supported Sitting balance-Leahy Scale: Fair     Standing balance support: No upper extremity supported Standing balance-Leahy Scale: Fair                             ADL either performed or assessed with clinical judgement   ADL Overall ADL's : Needs assistance/impaired Eating/Feeding: Independent   Grooming: Supervision/safety;Standing   Upper Body Bathing: Supervision/ safety;Sitting   Lower Body Bathing: Supervison/ safety;Sitting/lateral leans   Upper Body Dressing : Supervision/safety;Sitting   Lower Body Dressing: Supervision/safety;Sit to/from stand   Toilet Transfer: Min guard;Ambulation;BSC/3in1   Toileting- Water quality scientist and Hygiene: Supervision/safety       Functional mobility during ADLs: Min guard       Vision Patient Visual Report: No change from baseline       Perception     Praxis      Pertinent Vitals/Pain Pain Assessment: 0-10 Pain Score: 8  Pain Location: back Pain Descriptors / Indicators: Aching;Grimacing;Guarding Pain Intervention(s): Limited activity within patient's tolerance;Monitored during session;Repositioned     Hand Dominance Right   Extremity/Trunk Assessment Upper Extremity Assessment Upper Extremity Assessment: Overall WFL for tasks assessed   Lower Extremity Assessment Lower Extremity Assessment: Defer to PT evaluation       Communication Communication Communication: No difficulties   Cognition Arousal/Alertness: Lethargic Behavior During Therapy: WFL for tasks assessed/performed Overall Cognitive Status: Within Functional Limits for tasks assessed  General Comments: A&Ox3, however slow processing and requiring cues for safety.     General Comments       Exercises     Shoulder Instructions      Home Living Family/patient expects to be discharged to:: Private residence Living Arrangements: Spouse/significant other Available Help  at Discharge: Family;Available 24 hours/day Type of Home: House Home Access: Stairs to enter [employer] of Steps: 11 Entrance Stairs-Rails: Left Home Layout: One level     Bathroom Shower/Tub: Teacher, early years/pre: Standard     Home Equipment: Conservation officer, nature (2 wheels);Shower seat          Prior Functioning/Environment Prior Level of Function : Independent/Modified Independent;Driving                        OT Problem List: Impaired balance (sitting and/or standing);Decreased safety awareness      OT Treatment/Interventions: Self-care/ADL training;Therapeutic exercise;Therapeutic activities;Patient/family education;Balance training    OT Goals(Current goals can be found in the care plan section) Acute Rehab OT Goals Patient Stated Goal: return home OT Goal Formulation: With patient Time For Goal Achievement: 02/16/21 Potential to Achieve Goals: Good  OT Frequency: Min 2X/week   Barriers to D/C:            Co-evaluation              AM-PAC OT "6 Clicks" Daily Activity     Outcome Measure Help from another person eating meals?: None Help from another person taking care of personal grooming?: A Little Help from another person toileting, which includes using toliet, bedpan, or urinal?: A Little Help from another person bathing (including washing, rinsing, drying)?: A Little Help from another person to put on and taking off regular upper body clothing?: A Little Help from another person to put on and taking off regular lower body clothing?: A Little 6 Click Score: 19   End of Session Equipment Utilized During Treatment: Gait belt Nurse Communication: Mobility status  Activity Tolerance: Patient limited by lethargy Patient left: in bed;with call bell/phone within reach  OT Visit Diagnosis: Unsteadiness on feet (R26.81)                Time: 6333-5456 OT Time Calculation (min): 13 min Charges:  OT General Charges $OT Visit:  1 Visit OT Evaluation $OT Eval Low Complexity: 1 Low  Ryan Copeland C, OT/L  Acute Rehab [employer] 02/02/2021, 11:21 AM

## 2021-02-02 NOTE — Progress Notes (Signed)
PROGRESS NOTE                                                                                                                                                                                                             Patient Demographics:    Ryan Copeland, is a 70 y.o. male, DOB - Oct 26, 1950, GGE:[id number]  Outpatient Primary MD for the patient is [name]., PA-C    LOS - 0  Admit date - 02/01/2021    Chief Complaint  Patient presents with   Altered Mental Status       Brief Narrative (HPI from H&P)   CHER EGNOR is a 70 y.o. 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   Subjective:    Daneil Beem today has, No headache, No chest pain, No abdominal pain - No Nausea, No new weakness tingling or numbness, no cough, ++ lower back pain.   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  Most likely source of his encephalopathy is pneumonia in the setting of underlying lung cancer, on empiric IV antibiotics which will be continued, monitor procalcitonin, leukocyte count and temperature curve along with cultures.  Supportive care.  Ongoing lower back pain for the last 1 week.  Obtain CT scan of L-spine, right hip and pelvis.  Monitor with supportive care.  Actemra/Baricitinib  off label use - patient was told that if COVID-19 pneumonitis gets worse we might potentially use Actemra off label, patient denies any known history of active diverticulitis, tuberculosis or hepatitis, understands the risks and  benefits and wants to proceed with Actemra treatment if required.  Non-small cell left lung cancer.  Recently completed radiation treatment.  Under the care of Dr. Valeta Harms.  CT chest ordered will monitor.  Question underlying pneumonia.  Small cell B-cell lymphoma (CLL) -under the care of Dr. Florene Glen Case discussed with her, hold Calquence due to possibility of acute infection pneumonia/UTI.  Hypogammaglobinemia requiring monthly IVIG treatment.  Supportive care.  Chronic indwelling Foley catheter.  Catheter changed on 02/01/2021.  Monitor.  History of gout.  Continue acyclovir.  Dyslipidemia.  On statin.   History of CAD with LAD stent.  Aortic root dilation.  Blood pressure low hence no beta-blocker, severe baseline thrombocytopenia hence no aspirin.  Continue statin for supportive care with outpatient PCP and cardiology follow-up  Chronic moderate to severe thrombocytopenia.  Monitor.       Condition - Extremely Guarded  Family Communication  : Wife [name] [phone] on 02/02/2021    Code Status :  DNR  Consults  :  Oncology over the phone  PUD Prophylaxis :  Pepcid   Procedures  :     CT Chest  CT L spine  CT R.Hip & Pelvis      Disposition Plan  :    Status is: Observation  DVT Prophylaxis  :     SCDs Start: 02/01/21 2047   Lab Results  Component Value Date   PLT 54 (L) 02/02/2021    Diet :  Diet Order             DIET SOFT Room service appropriate? Yes; Fluid consistency: Thin  Diet effective now                    Inpatient Medications  Scheduled Meds:  acyclovir  400 mg Oral BID   allopurinol  300 mg Oral Daily   benzonatate  100 mg Oral TID   famotidine  20 mg Oral BID   midodrine  15 mg Oral TID WC   montelukast  10 mg Oral Daily   rosuvastatin  5 mg Oral Daily   sodium zirconium cyclosilicate  10 g Oral TID   Continuous Infusions:  ceFEPime (MAXIPIME) IV Stopped (02/02/21 0249)   lactated ringers 75 mL/hr at 02/02/21 0851    vancomycin     PRN Meds:.acetaminophen, diphenoxylate-atropine, oxybutynin  Antibiotics  :    Anti-infectives (From admission, onward)    Start     Dose/Rate Route Frequency Ordered Stop   02/02/21 1000  vancomycin (VANCOCIN) IVPB 1000 mg/200 mL premix        1,000 mg 200 mL/hr over 60 Minutes Intravenous Every 12 hours 02/02/21 0008     02/02/21 0200  ceFEPIme (MAXIPIME) 2 g in sodium chloride 0.9 % 100 mL IVPB        2 g 200 mL/hr over 30 Minutes Intravenous Every 8 hours 02/02/21 0008     02/01/21 2200  acyclovir (ZOVIRAX) tablet 400 mg        400 mg Oral 2 times daily 02/01/21 2108     02/01/21 1745  vancomycin (VANCOREADY) IVPB 1750 mg/350 mL        1,750 mg 175 mL/hr over 120 Minutes Intravenous  Once 02/01/21 1733 02/01/21 2255   02/01/21 1745  piperacillin-tazobactam (ZOSYN) IVPB 3.375 g        3.375 g 12.5 mL/hr over 240 Minutes Intravenous  Once 02/01/21 1733 02/01/21 2255        Time Spent in minutes  30   Lala Lund M.D on 02/02/2021 at 10:16 AM  To page go to [email or web address]   [facility] -  Office  [phone]  See all Orders from today for further details    Objective:   Vitals:   02/01/21 2200 02/02/21 0000 02/02/21 0429 02/02/21 0800  BP: 101/63 102/60 108/60 109/68  Pulse: 74 66 63 65  Resp: 18 16 18  16  Temp: 99.8 F (37.7 C)  98.4 F (36.9 C) 98.4 F (36.9 C)  TempSrc: Oral  Axillary Oral  SpO2: 98% 97% 96% 97%    Wt Readings from Last 3 Encounters:  01/29/21 78.4 kg  01/13/21 76.8 kg  01/04/21 76.5 kg     Intake/Output Summary (Last 24 hours) at 02/02/2021 1016 Last data filed at 02/02/2021 7829 Gross per 24 hour  Intake --  Output 1550 ml  Net -1550 ml     Physical Exam  Awake Alert, No new F.N deficits, Normal affect [place].AT,PERRAL Supple Neck, No JVD,   Symmetrical Chest wall movement, Good air movement bilaterally, CTAB RRR,No Gallops,Rubs or new Murmurs,  +ve B.Sounds, Abd Soft, No tenderness,   No  Cyanosis, Clubbing or edema , suprapubic catheter      Data Review:    CBC Recent Labs  Lab 01/29/21 0948 02/01/21 1617 02/02/21 0427  WBC 11.1* 9.7 10.1  HGB 11.2* 12.0* 11.6*  HCT 36.1* 38.4* 36.3*  PLT 68* 66* 54*  MCV 88.9 89.7 89.9  MCH 27.6 28.0 28.7  MCHC 31.0 31.3 32.0  RDW 17.2* 17.1* 17.2*  LYMPHSABS 7.7* 6.7*  --   MONOABS 0.6 0.6  --   EOSABS 0.0 0.0  --   BASOSABS 0.0 0.0  --     Electrolytes Recent Labs  Lab 01/29/21 0948 02/01/21 1617 02/01/21 1618 02/02/21 0427  NA 140 134*  --  136  K 4.5 4.1  --  5.7*  CL 106 100  --  105  CO2 26 26  --  25  GLUCOSE 131* 115*  --  80  BUN 11 14  --  14  CREATININE 0.80 0.98  --  0.87  CALCIUM 8.6* 8.9  --  8.5*  AST 20 17  --   --   ALT 31 25  --   --   ALKPHOS 125 86  --   --   BILITOT 0.3 0.3  --   --   ALBUMIN 3.6 3.6  --   --   LATICACIDVEN  --   --  1.2  --   AMMONIA  --  14  --   --     ------------------------------------------------------------------------------------------------------------------ No results for input(s): CHOL, HDL, LDLCALC, TRIG, CHOLHDL, LDLDIRECT in the last 72 hours.  Lab Results  Component Value Date   HGBA1C 4.6 (L) 11/02/2020    No results for input(s): TSH, T4TOTAL, T3FREE, THYROIDAB in the last 72 hours.  Invalid input(s): FREET3 ------------------------------------------------------------------------------------------------------------------ ID Labs Recent Labs  Lab 01/29/21 0948 02/01/21 1617 02/01/21 1618 02/02/21 0427  WBC 11.1* 9.7  --  10.1  PLT 68* 66*  --  54*  LATICACIDVEN  --   --  1.2  --   CREATININE 0.80 0.98  --  0.87   Cardiac Enzymes No results for input(s): CKMB, TROPONINI, MYOGLOBIN in the last 168 hours.  Invalid input(s): CK  Radiology Reports DG Chest 2 View  Result Date: 02/01/2021 CLINICAL DATA:  Altered mental status. EXAM: CHEST - 2 VIEW COMPARISON:  Chest x-ray 12/08/2020. FINDINGS: Right chest port catheter tip projects  over the distal SVC. The heart is mildly enlarged, unchanged. There is some minimal interstitial and patchy opacities in both lung bases, left greater than right. There is no pleural effusion or pneumothorax. There is a stable calcified nodule in the right mid lung. No acute fractures are identified. IMPRESSION: 1. Cardiomegaly with mild interstitial edema. Infection is not excluded in the left lung base.  Electronically Signed   By: Ronney Asters M.D.   On: 02/01/2021 17:46   CT Head Wo Contrast  Result Date: 02/01/2021 CLINICAL DATA:  Mental status change, unknown cause EXAM: CT HEAD WITHOUT CONTRAST TECHNIQUE: Contiguous axial images were obtained from the base of the skull through the vertex without intravenous contrast. COMPARISON:  CT head 11/02/2020 BRAIN: BRAIN Patchy and confluent areas of decreased attenuation are noted throughout the deep and periventricular white matter of the cerebral hemispheres bilaterally, compatible with chronic microvascular ischemic disease. No evidence of large-territorial acute infarction. No parenchymal hemorrhage. No mass lesion. No extra-axial collection. No mass effect or midline shift. No hydrocephalus. Basilar cisterns are patent. Vascular: No hyperdense vessel. Atherosclerotic calcifications are present within the cavernous internal carotid arteries. Skull: No acute fracture or focal lesion. Sinuses/Orbits: Paranasal sinuses and mastoid air cells are clear. The orbits are unremarkable. Other: None. IMPRESSION: No acute intracranial abnormality. Electronically Signed   By: Iven Finn M.D.   On: 02/01/2021 18:56   DG Chest Port 1 View  Result Date: 02/02/2021 CLINICAL DATA:  70 year old male with altered mental status and shortness of breath. History of lymphoma. And history of malignant cells on left upper lobe needle biopsy in October. EXAM: PORTABLE CHEST 1 VIEW COMPARISON:  Chest radiographs 02/01/2021 and earlier. FINDINGS: Portable AP semi upright view  at 0740 hours. Stable right chest power port. Larger lung volumes. Normal cardiac size and mediastinal contours. Visualized tracheal air column is within normal limits. Left mid lung nodule with adjacent surgical clip appears grossly stable from October CT (please see that report). No superimposed pneumothorax, pulmonary edema, pleural effusion or new pulmonary opacity. No acute osseous abnormality identified. IMPRESSION: 1. Grossly stable left lung nodule corresponding to known non-small cell carcinoma. 2. Larger lung volumes with no new cardiopulmonary abnormality. Electronically Signed   By: Genevie Ann M.D.   On: 02/02/2021 07:56

## 2021-02-02 NOTE — ED Notes (Signed)
Provider at bedside

## 2021-02-02 NOTE — ED Notes (Signed)
pt refused blood work

## 2021-02-02 NOTE — Progress Notes (Signed)
Holding Acalabrutinib per oral chemo policy as pt admitted with presumed PNA and receiving abx. Secure messaged Dr. Alvy Bimler (oncology) 12/20 and she will d/w pt - plan to hold this med until she sees him at o/p appt in Jan.  Sherlon Handing, PharmD, BCPS Please see amion for complete clinical pharmacist phone list 02/02/2021 10:15 AM

## 2021-02-02 NOTE — Plan of Care (Signed)
  Problem: Education: Goal: Knowledge of General Education information will improve Description Including pain rating scale, medication(s)/side effects and non-pharmacologic comfort measures Outcome: Progressing   

## 2021-02-03 ENCOUNTER — Inpatient Hospital Stay (HOSPITAL_COMMUNITY): Payer: Medicare Other

## 2021-02-03 ENCOUNTER — Telehealth: Payer: Self-pay

## 2021-02-03 LAB — CBC WITH DIFFERENTIAL/PLATELET
Abs Immature Granulocytes: 0.03 10*3/uL (ref 0.00–0.07)
Basophils Absolute: 0 10*3/uL (ref 0.0–0.1)
Basophils Relative: 0 %
Eosinophils Absolute: 0 10*3/uL (ref 0.0–0.5)
Eosinophils Relative: 1 %
HCT: 34.3 % — ABNORMAL LOW (ref 39.0–52.0)
Hemoglobin: 10.9 g/dL — ABNORMAL LOW (ref 13.0–17.0)
Immature Granulocytes: 1 %
Lymphocytes Relative: 45 %
Lymphs Abs: 1.8 10*3/uL (ref 0.7–4.0)
MCH: 28.4 pg (ref 26.0–34.0)
MCHC: 31.8 g/dL (ref 30.0–36.0)
MCV: 89.3 fL (ref 80.0–100.0)
Monocytes Absolute: 0.4 10*3/uL (ref 0.1–1.0)
Monocytes Relative: 10 %
Neutro Abs: 1.7 10*3/uL (ref 1.7–7.7)
Neutrophils Relative %: 43 %
Platelets: 55 10*3/uL — ABNORMAL LOW (ref 150–400)
RBC: 3.84 MIL/uL — ABNORMAL LOW (ref 4.22–5.81)
RDW: 17 % — ABNORMAL HIGH (ref 11.5–15.5)
WBC: 3.9 10*3/uL — ABNORMAL LOW (ref 4.0–10.5)
nRBC: 0 % (ref 0.0–0.2)

## 2021-02-03 LAB — COMPREHENSIVE METABOLIC PANEL
ALT: 17 U/L (ref 0–44)
AST: 13 U/L — ABNORMAL LOW (ref 15–41)
Albumin: 2.7 g/dL — ABNORMAL LOW (ref 3.5–5.0)
Alkaline Phosphatase: 71 U/L (ref 38–126)
Anion gap: 5 (ref 5–15)
BUN: 16 mg/dL (ref 8–23)
CO2: 27 mmol/L (ref 22–32)
Calcium: 8.8 mg/dL — ABNORMAL LOW (ref 8.9–10.3)
Chloride: 107 mmol/L (ref 98–111)
Creatinine, Ser: 0.82 mg/dL (ref 0.61–1.24)
GFR, Estimated: >60 mL/min (ref >60)
Glucose, Bld: 113 mg/dL — ABNORMAL HIGH (ref 70–99)
Potassium: 3.7 mmol/L (ref 3.5–5.1)
Sodium: 139 mmol/L (ref 135–145)
Total Bilirubin: 0.6 mg/dL (ref 0.3–1.2)
Total Protein: 5.4 g/dL — ABNORMAL LOW (ref 6.5–8.1)

## 2021-02-03 LAB — LEGIONELLA PNEUMOPHILA SEROGP 1 UR AG: L. pneumophila Serogp 1 Ur Ag: NEGATIVE

## 2021-02-03 LAB — C-REACTIVE PROTEIN: CRP: 2.5 mg/dL — ABNORMAL HIGH (ref <1.0)

## 2021-02-03 LAB — BRAIN NATRIURETIC PEPTIDE: B Natriuretic Peptide: 114.3 pg/mL — ABNORMAL HIGH (ref 0.0–100.0)

## 2021-02-03 LAB — MAGNESIUM: Magnesium: 1.7 mg/dL (ref 1.7–2.4)

## 2021-02-03 LAB — PROCALCITONIN: Procalcitonin: 43.03 ng/mL

## 2021-02-03 MED ORDER — GADOBUTROL 1 MMOL/ML IV SOLN
7.5000 mL | Freq: Once | INTRAVENOUS | Status: AC | PRN
  Administered 2021-02-03: 7.5 mL via INTRAVENOUS

## 2021-02-03 MED ORDER — LORAZEPAM 2 MG/ML IJ SOLN
0.5000 mg | Freq: Once | INTRAMUSCULAR | Status: AC | PRN
  Administered 2021-02-03: 0.5 mg via INTRAVENOUS
  Filled 2021-02-03: qty 1

## 2021-02-03 MED ORDER — KETOROLAC TROMETHAMINE 15 MG/ML IJ SOLN
15.0000 mg | Freq: Once | INTRAMUSCULAR | Status: AC
  Administered 2021-02-03: 15 mg via INTRAVENOUS
  Filled 2021-02-03: qty 1

## 2021-02-03 MED ORDER — TRAMADOL HCL 50 MG PO TABS: 50.0000 mg | ORAL_TABLET | Freq: Four times a day (QID) | ORAL | Status: DC | PRN

## 2021-02-03 MED ORDER — LACTATED RINGERS IV SOLN: INTRAVENOUS | Status: AC

## 2021-02-03 NOTE — Progress Notes (Addendum)
We were notified that the patient had been admitted and has been treated with stereotactic radiotherapy to the left upper lobe for  Stage IA2, cT1bN0M0, NSCLC, of the LUL. He finished his treatment on 01/19/2021 and the left upper lobe tumor was treated to 54 Gy in 3 fractions.  He recently developed back pain, and altered mental status and was seen in the ED on 02/01/2021. Chest x-ray at that time showed cardiomegaly with mild interstitial edema infection was not excluded in the left lung base.  A CT of his head showed no acute intracranial abnormality.  He was admitted after this presentation but apparently he had also had recent exchange of his suprapubic catheter, a low-grade temperature of 99 but complaints of chills.  He was started on broad-spectrum antibiotics for pneumonia.  Yesterday a CT of the chest without contrast showed the left upper lobe nodule measuring 1.6 cm and patchy nodular airspace disease in the left lower lobe with associated volume loss as well as stigmata of atherosclerosis.  Apparently he had also been having falls in the outpatient setting in right-sided low back pain and a CT of the lumbar spine showed a 37mm lesion in the L3 vertebral body CT of the pelvis without contrast showed no acute osseous abnormality, unchanged thyromegaly and aortic atherosclerotic disease.  An MRI of the lumbar spine without contrast yesterday showed a 2 cm round lesion in the anterior/superior L3 vertebral body concerning for metastatic disease.  Surrounding marrow edema may be reactive or represent superimposed pathologic fracture in addition at 10 mm L2 and an 8 mm L1 lesion are indeterminate. Dr. Lisbeth Renshaw has reviewed the clinical course.  The change in the left upper lobe lesion may be post radiation related but it would be unlikely that his pneumonia is the result of radiation given the different lobe of the pneumonia is located.  The timing as well would be unlikely for radiation pneumonitis.  The most  helpful information for [place] to gather would be a MRI of the thoracic spine and consideration of CT-guided biopsy of the lesion in the L3 vertebral body plus or minus osteopool ablation and vertebral augmentation if there is indeed a compression fracture.         I called and spoke with the patient's wife. He has a history of CLL that has involved the CSF diagnosed by LP at [facility] in August 2022. He has been on systemic therapy with Dr. Alvy Bimler. She feels he is still very confused and is concerned about his overall status. Now that his PAC is accessed, we discussed CT chest with contrast, IR biopsy of the L3 lesion seen on MRI to clarify if this is truly lung cancer metastasis versus other benign or malignant disease, and evaluation with Dr. Mickeal Skinner in Dr. Calton Dach absence. Dr. Lindi Adie is also in agreement with this plan.     Carola Rhine, PAC

## 2021-02-03 NOTE — Telephone Encounter (Signed)
Received call from Geronimo Running (wife) regarding pt needing an appt scheduled within 7-10 days per hospital MD.

## 2021-02-03 NOTE — Progress Notes (Signed)
Patient refused to have a big urinary drain bag on his supra catheter.

## 2021-02-03 NOTE — Progress Notes (Signed)
I discussed pt's case with Dr. Mickeal Skinner and he recommends MRI brain as well. Orders placed.

## 2021-02-03 NOTE — Plan of Care (Signed)
  Problem: Activity: Goal: Risk for activity intolerance will decrease Outcome: Progressing   

## 2021-02-03 NOTE — Progress Notes (Addendum)
PROGRESS NOTE                                                                                                                                                                                                             Patient Demographics:    Ryan Copeland, is a 70 y.o. male, DOB - 01/27/1951, QVZ:[id number]  Outpatient Primary MD for the patient is [name]., PA-C    LOS - 1  Admit date - 02/01/2021    Chief Complaint  Patient presents with   Altered Mental Status       Brief Narrative (HPI from H&P)   Ryan Copeland is a 70 y.o. 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   Subjective:   Patient in bed, appears comfortable, denies any headache, no fever, no chest pain or pressure, no shortness of breath , no abdominal pain. No new focal weakness. + Back pain.   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  Most likely source of his encephalopathy is ongoing lower back pain with lack of good quality sleep and being on narcotics for that.  There is also chance of recurrent pneumonia due to his underlying lung cancer which seems to be evolving on CT chest, continue empiric antibiotic till cultures are negative, outpatient follow-up with PCP and oncologist post discharge.  Ongoing lower back pain for the last 1 week.  CT and MRI of L-spine noted with possible L2 metastatic lesion, discussed with neurosurgery, supportive care and outpatient radiation oncology and oncology follow-up.  Supportive care to  continue.  Have informed through epic his radiation oncologist Dr. Lisbeth Renshaw and oncology team on call today.  Non-small cell left lung cancer.  Recently completed radiation treatment.  Under the care of Dr. Valeta Harms pulmonary and Dr. Lisbeth Renshaw radiation oncology, CT scan noted.  Continue outpatient follow-up, possible pneumonia as a #1 above.  Small cell B-cell lymphoma (CLL) - under the care of Dr. Alvy Bimler Case discussed  with her, hold Calquence due to possibility of acute infection pneumonia/UTI.  Hypogammaglobinemia requiring monthly IVIG treatment.  Supportive care.  Chronic indwelling Foley catheter.  Catheter changed on 02/01/2021.  Monitor.  UA appears stable.  History of gout.  Continue acyclovir.  Dyslipidemia.  On statin.   History of CAD with LAD stent.  Aortic root dilation.  Blood pressure low hence no beta-blocker, severe baseline thrombocytopenia hence no aspirin.  Continue statin for supportive care with outpatient PCP and cardiology follow-up  Chronic moderate to severe thrombocytopenia.  Monitor.  On 02/03/2021 his care was coordinated with discussions with following physicians  Dr. Alvy Bimler, Dr. Sonny Dandy, Dr. Lisbeth Renshaw, oncology PA Ms. Worthy Flank, Dr. June Leap, Dr. Dominica Severin cram neurosurgery.       Condition - Extremely Guarded  Family Communication  : Wife [name] [phone] on 02/02/2021, 02/03/2021  Code Status :  DNR  Consults  :  Oncology over the phone, NS  PUD Prophylaxis :  Pepcid   Procedures  :     CT Chest - 1. Clear progression of the left upper lobe pulmonary nodule, now measuring 1.6 cm in the same dimension that was previously measured at 1.2 cm. 2. Interval progression of patchy and nodular airspace disease in the left lower lobe with associated volume loss. Imaging features likely related to infectious/inflammatory etiology. 3. Aortic Atherosclerosis  MRI L Spine -  1. Approximately 2 cm round lesion within the anterior/superior L3 vertebral body,  concerning for metastatic disease in this patient with known lung cancer. Surrounding marrow edema may be reactive or represent superimposed pathologic fracture. 2. Approximately 10 mm L2 and 8 mm L1 lesions are indeterminate. The patient refused contrast on this study. If the patient is willing, postcontrast imaging may be helpful to better evaluate. Alternatively, follow-up MRI could assess stability. 3. No significant canal or foraminal stenosis.      Disposition Plan  :    Status is: Inpt  DVT Prophylaxis  :     SCDs Start: 02/01/21 2047   Lab Results  Component Value Date   PLT 55 (L) 02/03/2021    Diet :  Diet Order             Diet heart healthy/carb modified Room service appropriate? Yes; Fluid consistency: Thin  Diet effective now                    Inpatient Medications  Scheduled Meds:  acyclovir  400 mg Oral BID   allopurinol  300 mg Oral Daily   benzonatate  100 mg Oral TID   Chlorhexidine Gluconate Cloth  6 each Topical Daily   famotidine  20 mg Oral BID   midodrine  15 mg Oral TID WC   montelukast  10 mg Oral Daily   rosuvastatin  5 mg Oral Daily   Continuous Infusions:  ceFEPime (MAXIPIME) IV 2 g (02/03/21 0457)   vancomycin 1,000 mg (02/02/21 2254)   PRN Meds:.acetaminophen, diphenoxylate-atropine, HYDROcodone-acetaminophen, oxybutynin, sodium chloride flush  Antibiotics  :    Anti-infectives (From admission, onward)    Start     Dose/Rate Route Frequency Ordered Stop   02/02/21 1000  vancomycin (VANCOCIN) IVPB 1000 mg/200 mL premix        1,000 mg 200 mL/hr over 60 Minutes Intravenous Every 12 hours 02/02/21 0008     02/02/21 0200  ceFEPIme (MAXIPIME) 2 g in sodium chloride 0.9 % 100 mL IVPB        2 g 200 mL/hr  over 30 Minutes Intravenous Every 8 hours 02/02/21 0008     02/01/21 2200  acyclovir (ZOVIRAX) tablet 400 mg        400 mg Oral 2 times daily 02/01/21 2108     02/01/21 1745  vancomycin (VANCOREADY) IVPB 1750 mg/350 mL         1,750 mg 175 mL/hr over 120 Minutes Intravenous  Once 02/01/21 1733 02/01/21 2255   02/01/21 1745  piperacillin-tazobactam (ZOSYN) IVPB 3.375 g        3.375 g 12.5 mL/hr over 240 Minutes Intravenous  Once 02/01/21 1733 02/01/21 2255        Time Spent in minutes  30   Lala Lund M.D on 02/03/2021 at 9:30 AM  To page go to [email or web address]   [facility] -  Office  [phone]  See all Orders from today for further details    Objective:   Vitals:   02/02/21 2120 02/03/21 0046 02/03/21 0441 02/03/21 0851  BP: 137/73 138/78 137/63 126/61  Pulse: (!) 58 60 (!) 59 60  Resp: 18 17 18 20   Temp: 97.8 F (36.6 C) 97.6 F (36.4 C) 97.9 F (36.6 C) 98.5 F (36.9 C)  TempSrc: Oral Oral  Oral  SpO2: 97% 98% 100% 94%    Wt Readings from Last 3 Encounters:  01/29/21 78.4 kg  01/13/21 76.8 kg  01/04/21 76.5 kg     Intake/Output Summary (Last 24 hours) at 02/03/2021 0930 Last data filed at 02/03/2021 2706 Gross per 24 hour  Intake 1944.69 ml  Output 1600 ml  Net 344.69 ml     Physical Exam  Awake Alert, No new F.N deficits, Normal affect [place].AT,PERRAL Supple Neck, No JVD,   Symmetrical Chest wall movement, Good air movement bilaterally, CTAB RRR,No Gallops, Rubs or new Murmurs,  +ve B.Sounds, Abd Soft, No tenderness,   No Cyanosis, suprapubic catheter      Data Review:    CBC Recent Labs  Lab 01/29/21 0948 02/01/21 1617 02/02/21 0427 02/03/21 0439  WBC 11.1* 9.7 10.1 3.9*  HGB 11.2* 12.0* 11.6* 10.9*  HCT 36.1* 38.4* 36.3* 34.3*  PLT 68* 66* 54* 55*  MCV 88.9 89.7 89.9 89.3  MCH 27.6 28.0 28.7 28.4  MCHC 31.0 31.3 32.0 31.8  RDW 17.2* 17.1* 17.2* 17.0*  LYMPHSABS 7.7* 6.7*  --  1.8  MONOABS 0.6 0.6  --  0.4  EOSABS 0.0 0.0  --  0.0  BASOSABS 0.0 0.0  --  0.0    Electrolytes Recent Labs  Lab 01/29/21 0948 02/01/21 1617 02/01/21 1618 02/02/21 0427 02/02/21 2137 02/03/21 0439  NA 140 134*  --  136  --  139  K 4.5 4.1  --  5.7*  --   3.7  CL 106 100  --  105  --  107  CO2 26 26  --  25  --  27  GLUCOSE 131* 115*  --  80  --  113*  BUN 11 14  --  14  --  16  CREATININE 0.80 0.98  --  0.87  --  0.82  CALCIUM 8.6* 8.9  --  8.5*  --  8.8*  AST 20 17  --   --   --  13*  ALT 31 25  --   --   --  17  ALKPHOS 125 86  --   --   --  71  BILITOT 0.3 0.3  --   --   --  0.6  ALBUMIN 3.6  3.6  --   --   --  2.7*  MG  --   --   --   --  2.0 1.7  CRP  --   --   --   --  3.7* 2.5*  PROCALCITON  --   --   --   --  <0.10 43.03  LATICACIDVEN  --   --  1.2  --   --   --   AMMONIA  --  14  --   --   --   --   BNP  --   --   --   --  96.8 114.3*    ------------------------------------------------------------------------------------------------------------------ No results for input(s): CHOL, HDL, LDLCALC, TRIG, CHOLHDL, LDLDIRECT in the last 72 hours.  Lab Results  Component Value Date   HGBA1C 4.6 (L) 11/02/2020    No results for input(s): TSH, T4TOTAL, T3FREE, THYROIDAB in the last 72 hours.  Invalid input(s): FREET3 ------------------------------------------------------------------------------------------------------------------ ID Labs Recent Labs  Lab 01/29/21 0948 02/01/21 1617 02/01/21 1618 02/02/21 0427 02/02/21 2137 02/03/21 0439  WBC 11.1* 9.7  --  10.1  --  3.9*  PLT 68* 66*  --  54*  --  55*  CRP  --   --   --   --  3.7* 2.5*  PROCALCITON  --   --   --   --  <0.10 43.03  LATICACIDVEN  --   --  1.2  --   --   --   CREATININE 0.80 0.98  --  0.87  --  0.82   Cardiac Enzymes No results for input(s): CKMB, TROPONINI, MYOGLOBIN in the last 168 hours.  Invalid input(s): CK  Radiology Reports DG Chest 2 View  Result Date: 02/01/2021 CLINICAL DATA:  Altered mental status. EXAM: CHEST - 2 VIEW COMPARISON:  Chest x-ray 12/08/2020. FINDINGS: Right chest port catheter tip projects over the distal SVC. The heart is mildly enlarged, unchanged. There is some minimal interstitial and patchy opacities in both lung  bases, left greater than right. There is no pleural effusion or pneumothorax. There is a stable calcified nodule in the right mid lung. No acute fractures are identified. IMPRESSION: 1. Cardiomegaly with mild interstitial edema. Infection is not excluded in the left lung base. Electronically Signed   By: Ronney Asters M.D.   On: 02/01/2021 17:46   CT Head Wo Contrast  Result Date: 02/01/2021 CLINICAL DATA:  Mental status change, unknown cause EXAM: CT HEAD WITHOUT CONTRAST TECHNIQUE: Contiguous axial images were obtained from the base of the skull through the vertex without intravenous contrast. COMPARISON:  CT head 11/02/2020 BRAIN: BRAIN Patchy and confluent areas of decreased attenuation are noted throughout the deep and periventricular white matter of the cerebral hemispheres bilaterally, compatible with chronic microvascular ischemic disease. No evidence of large-territorial acute infarction. No parenchymal hemorrhage. No mass lesion. No extra-axial collection. No mass effect or midline shift. No hydrocephalus. Basilar cisterns are patent. Vascular: No hyperdense vessel. Atherosclerotic calcifications are present within the cavernous internal carotid arteries. Skull: No acute fracture or focal lesion. Sinuses/Orbits: Paranasal sinuses and mastoid air cells are clear. The orbits are unremarkable. Other: None. IMPRESSION: No acute intracranial abnormality. Electronically Signed   By: Iven Finn M.D.   On: 02/01/2021 18:56   CT CHEST WO CONTRAST  Result Date: 02/02/2021 CLINICAL DATA:  Pneumonia. EXAM: CT CHEST WITHOUT CONTRAST TECHNIQUE: Multidetector CT imaging of the chest was performed following the standard protocol without IV contrast. COMPARISON:  chest x-ray earlier same  day.  Chest CT 12/02/2020 FINDINGS: Cardiovascular: Heart size upper normal. Coronary artery calcification is evident. Mild atherosclerotic calcification is noted in the wall of the thoracic aorta. Right Port-A-Cath tip is  positioned in the distal SVC. Mediastinum/Nodes: Stable upper normal subcarinal lymph node. No evidence for gross hilar lymphadenopathy although assessment is limited by the lack of intravenous contrast on the current study. The esophagus has normal imaging features. There is no axillary lymphadenopathy. Similar appearance of subpectoral lymph nodes bilaterally. Lungs/Pleura: Clear progression of the left upper lobe pulmonary nodule now measuring 1.6 cm in the same dimension measured previously at 1.2 cm. Patchy and nodular airspace disease in the left lower lobe is progressive in the interval with associated volume loss. Fine detail is obscured by breathing motion. Dependent collapse/consolidative opacity is seen in both lungs, left greater than right no substantial pleural effusion. Upper Abdomen: Liver parenchyma heterogeneous on noncontrast imaging. Prominent spleen is similar to prior but incompletely visualized. Musculoskeletal: No worrisome lytic or sclerotic osseous abnormality. IMPRESSION: 1. Clear progression of the left upper lobe pulmonary nodule, now measuring 1.6 cm in the same dimension that was previously measured at 1.2 cm. 2. Interval progression of patchy and nodular airspace disease in the left lower lobe with associated volume loss. Imaging features likely related to infectious/inflammatory etiology. 3. Aortic Atherosclerosis (ICD10-I70.0). Electronically Signed   By: Misty Stanley M.D.   On: 02/02/2021 11:07   CT LUMBAR SPINE WO CONTRAST  Result Date: 02/02/2021 CLINICAL DATA:  70 year old male with lung cancer undergoing treatment. Increased falls. Right side low back pain. Altered mental status. EXAM: CT LUMBAR SPINE WITHOUT CONTRAST TECHNIQUE: Multidetector CT imaging of the lumbar spine was performed without intravenous contrast administration. Multiplanar CT image reconstructions were also generated. COMPARISON:  CT chest and pelvis today reported separately. Lumbar MRI 04/13/2015. CT  Abdomen and Pelvis 02/16/2020. FINDINGS: Segmentation: Normal. Alignment: Stable lumbar lordosis since January. No spondylolisthesis. Vertebrae: Round lucent/lytic lesion of the L3 anterior superior vertebral body (coronal image 17 and series 4, image 55) has partially sclerotic margin and abuts the superior endplate, but there was no is new since January with no evidence of developing Schmorl's node at that time. The lesion is about 16 mm diameter. And background bone mineralization appears more heterogeneous since January. But there is no other discrete osseous lesion identified. Chronic sclerosis of the medial left iliac bone appears stable and benign. Paraspinal and other soft tissues: Aortoiliac calcified atherosclerosis. Left Common iliac artery vascular stent. Some oral contrast is present in bowel. Lumbar paraspinal soft tissues remain within normal limits. Disc levels: T12-L1:  Negative. L1-L2:  Mild disc bulging.  No stenosis. L2-L3: Circumferential disc bulge appears progressed since 2017, with broad-based posterior component. However, there is no significant stenosis. L3-L4: Minor disc bulging appears increased since 2017. No stenosis. L4-L5: Mild disc bulging appears increased since 2017. No stenosis. L5-S1:  Mild disc bulging appears stable since 2017.  No stenosis. IMPRESSION: 1. A 16 mm lucent lesion of the L3 vertebral body is new since January and more suspicious for osseous metastatic disease than a degenerative Schmorl's node. Lumbar MRI (without and with contrast preferred) may characterize further. 2. No lumbar pathologic fracture or other acute finding by CT. Mild for age lumbar spine degeneration. Electronically Signed   By: Genevie Ann M.D.   On: 02/02/2021 11:10   CT PELVIS WO CONTRAST  Result Date: 02/02/2021 CLINICAL DATA:  Recent falls. EXAM: CT PELVIS WITHOUT CONTRAST TECHNIQUE: Multidetector CT imaging of the pelvis  was performed following the standard protocol without intravenous  contrast. COMPARISON:  November 02, 2020. FINDINGS: Urinary Tract: Bladder is decompressed by a suprapubic catheter. No abnormality visualized. Bowel:  Unremarkable visualized pelvic bowel loops. Vascular/Lymphatic: No pathologically enlarged lymph nodes. Aortoiliac atherosclerotic vascular disease. Reproductive:  Unchanged mild prostatomegaly. Other:  Unchanged splenomegaly, partially visualized. Musculoskeletal: No acute or significant osseous findings. IMPRESSION: 1. No acute osseous abnormality. 2. Unchanged splenomegaly. 3. Aortic Atherosclerosis (ICD10-I70.0). Electronically Signed   By: Titus Dubin M.D.   On: 02/02/2021 11:06   MR LUMBAR SPINE WO CONTRAST  Result Date: 02/02/2021 CLINICAL DATA:  Lumbar radiculopathy, symptoms persist with > 6 wks treatment EXAM: MRI LUMBAR SPINE WITHOUT CONTRAST TECHNIQUE: Multiplanar, multisequence MR imaging of the lumbar spine was performed. No intravenous contrast was administered. COMPARISON:  CT of the lumbar spine 02/02/2021. MRI of the lumbar spine 04/12/2005. FINDINGS: The patient refused postcontrast imaging. Segmentation: Standard segmentation is assumed. Alignment:  Normal Vertebrae: Approximately 2 cm round lesion within the anterior/superior L3 vertebral body, which is T1 hypointense and T2/STIR intermediate in signal with surrounding STIR hyperintense edema. Approximately 10 mm L2 and 8 mm L1 lesions, which are T1 and T2 hypointense (series 3 and 4, image 9) without appreciable STIR hyperintensity. Conus medullaris and cauda equina: Conus extends to the T2 level level. Conus appears normal. Paraspinal and other soft tissues: Unremarkable. Disc levels: T12-L1: No significant disc protrusion, foraminal stenosis, or canal stenosis. L1-L2: No significant disc protrusion, foraminal stenosis, or canal stenosis. L2-L3: No significant disc protrusion, foraminal stenosis, or canal stenosis. L3-L4: Mild facet arthropathy and slight disc bulge without  significant stenosis. L4-L5: Small broad disc bulge and mild bilateral facet arthropathy. No significant canal or foraminal stenosis. L5-S1: No significant disc protrusion, foraminal stenosis, or canal stenosis. IMPRESSION: 1. Approximately 2 cm round lesion within the anterior/superior L3 vertebral body, concerning for metastatic disease in this patient with known lung cancer. Surrounding marrow edema may be reactive or represent superimposed pathologic fracture. 2. Approximately 10 mm L2 and 8 mm L1 lesions are indeterminate. The patient refused contrast on this study. If the patient is willing, postcontrast imaging may be helpful to better evaluate. Alternatively, follow-up MRI could assess stability. 3. No significant canal or foraminal stenosis. Electronically Signed   By: Margaretha Sheffield M.D.   On: 02/02/2021 15:08   DG Chest Port 1 View  Result Date: 02/02/2021 CLINICAL DATA:  70 year old male with altered mental status and shortness of breath. History of lymphoma. And history of malignant cells on left upper lobe needle biopsy in October. EXAM: PORTABLE CHEST 1 VIEW COMPARISON:  Chest radiographs 02/01/2021 and earlier. FINDINGS: Portable AP semi upright view at 0740 hours. Stable right chest power port. Larger lung volumes. Normal cardiac size and mediastinal contours. Visualized tracheal air column is within normal limits. Left mid lung nodule with adjacent surgical clip appears grossly stable from October CT (please see that report). No superimposed pneumothorax, pulmonary edema, pleural effusion or new pulmonary opacity. No acute osseous abnormality identified. IMPRESSION: 1. Grossly stable left lung nodule corresponding to known non-small cell carcinoma. 2. Larger lung volumes with no new cardiopulmonary abnormality. Electronically Signed   By: Genevie Ann M.D.   On: 02/02/2021 07:56

## 2021-02-03 NOTE — Progress Notes (Signed)
Called in regards to this patients MRI lumbar spine who presented with worsening back pain. Has a history of lung cancer and lymphoma. MRI showed lesions in the L1, L2, and L3 vertebral body. No involvement of the canal or posterior elements. No neurosurgical intervention warranted. This will likely just be radiation to the spine.

## 2021-02-03 NOTE — Consult Note (Signed)
Chief Complaint: L3 lesion. Request is for L3 lesion biopsy   Referring Physician(s): [name] Stain PA  Supervising Physician: Ruthann Cancer  Patient Status: [facility] - In-pt  History of Present Illness: Ryan Copeland is a 70 y.o. 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 Presented to the ED at [facility] on 12.19.22 with AMS after suprapubic catheter change in the urology office and cough.  Found to be afebrile and hypotensive with PNA.  Due to ongoing lumbar radiculopathy a MR lumbar was ordered. MR Lumbar from 12.20.22 reads Approximately 2 cm round lesion within the anterior/superior L3 vertebral body, concerning for metastatic disease in this patient with known lung cancer. Surrounding marrow edema may be reactive or represent superimposed pathologic fracture.L3 vertebral body for biopsy.  Team is requesting a T3 lesion for further determination of metastases of lung cancer versus new primary.   Patient alert and laying in bed, calm and comfortable. Endorsing back pain. Denies any fevers, headache, chest pain, SOB, cough, abdominal pain, nausea, vomiting or bleeding. Return precautions and treatment recommendations and follow-up discussed with the patient  who is agreeable with the plan.   Past Medical History:  Diagnosis Date   Anxiety 01/30/2014   Back injury    lower disc   CAD (coronary artery disease)    CLL (chronic lymphocytic leukemia) ([place]) 03/18/2011   COPD (chronic obstructive pulmonary disease) ([place])    DM type 2 (diabetes mellitus, type 2) (HCC)    ECRB (extensor carpi radialis brevis) tenosynovitis    Foley catheter in place 11/2019   last changed 04-14-2020   GERD (gastroesophageal reflux disease)    takes Nexium if needed   History of blood transfusion 2015   Hyperlipidemia    Insomnia    Long-term current use of intravenous immunoglobulin (IVIG)    q month ivig   MI, acute, non ST segment elevation ([place]) 06/28/2009    with stenting of the LAD   Neuromuscular disorder ([place])    peripheral neuropathy both all toes   Pneumonia 11/2019   10/2020   PVD (peripheral vascular disease) ([place])    Thrombocytopenia ([place])    Tobacco abuse     Past Surgical History:  Procedure Laterality Date   ADENOIDECTOMY  1955   BRONCHIAL BIOPSY  12/08/2020   Procedure: BRONCHIAL BIOPSIES;  Surgeon: [name], DO;  Location: [facility];  Service: Pulmonary;;   BRONCHIAL BRUSHINGS  12/08/2020   Procedure: BRONCHIAL BRUSHINGS;  Surgeon: [name], DO;  Location: [facility];  Service: Pulmonary;;   BRONCHIAL NEEDLE ASPIRATION BIOPSY  12/08/2020   Procedure: BRONCHIAL NEEDLE ASPIRATION BIOPSIES;  Surgeon: [name], DO;  Location: [facility];  Service: Pulmonary;;   CARDIAC CATHETERIZATION     CARDIAC CATHETERIZATION N/A 09/26/2014   Procedure: Left Heart Cath and Coronary Angiography;  Surgeon: Peter M Martinique, MD;  Location: [facility];  Service: Cardiovascular;  Laterality: N/A;   carpel tunnel release Left 04-1989   carpel tunnel release  Right 01-1989   CHOLECYSTECTOMY  2007   [place]   CORONARY STENT PLACEMENT  06/2009   stent to lad   CYSTOSCOPY N/A 04/20/2020   Procedure: [place];  Surgeon: Janith Lima, MD;  Location: [facility];  Service: Urology;  Laterality: N/A;  ONLY NEEDS 30 MIN   femoral stents  09/2014   iliac stent   FIDUCIAL MARKER PLACEMENT  12/08/2020  Procedure: FIDUCIAL MARKER PLACEMENT;  Surgeon: [name], DO;  Location: [facility];  Service: Pulmonary;;   IR CV LINE INJECTION  08/18/2017   IR CV LINE INJECTION  09/01/2017   IR CV LINE INJECTION  02/02/2018   LATERAL EPICONDYLE RELEASE Left 02/12/2014   Procedure: LEFT ELBOW DEBRIDEMENT WITH TENDON REPAIR ;  Surgeon: Lorn Junes, MD;  Location: [place];  Service: Orthopedics;  Laterality: Left;   LEFT CAI STENT/PTA AND POPLITEAL ARTERY/TIBIAL THROMBECTOMY      LEFT  HEART CATHETERIZATION WITH CORONARY ANGIOGRAM N/A 08/26/2011   Procedure: LEFT HEART CATHETERIZATION WITH CORONARY ANGIOGRAM;  Surgeon: Peter M Martinique, MD;  Location: [facility];  Service: Cardiovascular;  Laterality: N/A;   PERIPHERAL VASCULAR CATHETERIZATION N/A 01/01/2015   Procedure: Abdominal Aortogram;  Surgeon: Conrad Ple[place] Hill, MD;  Location: [facility];  Service: Cardiovascular;  Laterality: N/A;   port a cath insertion  2014   right    TARSAL TUNNEL RELEASE Bilateral 08-2007   VIDEO BRONCHOSCOPY WITH ENDOBRONCHIAL NAVIGATION Left 12/08/2020   Procedure: VIDEO BRONCHOSCOPY WITH ENDOBRONCHIAL NAVIGATION;  Surgeon: [name], DO;  Location: [place];  Service: Pulmonary;  Laterality: Left;  ION w/ fiducial placement   VIDEO BRONCHOSCOPY WITH RADIAL ENDOBRONCHIAL ULTRASOUND  12/08/2020   Procedure: RADIAL ENDOBRONCHIAL ULTRASOUND;  Surgeon: [name], DO;  Location: [facility];  Service: Pulmonary;;    Allergies: Immune globulin and Codeine  Medications: Prior to Admission medications   Medication Sig Start Date End Date Taking? Authorizing Provider  Acalabrutinib Maleate (CALQUENCE) 100 MG TABS Take 1 tablet (100 mg) by mouth 2 (two) times daily. 01/22/21  Yes [name], MD  acetaminophen (TYLENOL) 325 MG tablet Take 2 tablets (650 mg total) by mouth every 6 (six) hours as needed for moderate pain or fever. 02/20/20  Yes Swayze, Ava, DO  acyclovir (ZOVIRAX) 400 MG tablet TAKE 1 TABLET BY MOUTH TWICE A DAY Patient taking differently: Take 400 mg by mouth 2 (two) times daily. 01/13/21  Yes Gorsuch, Ni, MD  allopurinol (ZYLOPRIM) 300 MG tablet Take 1 tablet (300 mg total) by mouth daily. 01/29/21  Yes Gorsuch, Ni, MD  ALPRAZolam Duanne Moron) 0.5 MG tablet Take 0.5 mg by mouth at bedtime as needed for anxiety. 09/08/20  Yes [provider]  b complex vitamins tablet Take 1 tablet by mouth daily.    Yes [provider]  benzonatate (TESSALON) 100 MG  capsule TAKE TWO CAPSULES BY MOUTH THREE TIMES A DAY AS NEEDED Patient taking differently: 100 mg 3 (three) times daily. 01/25/21  Yes Spero Geralds, MD  Cholecalciferol 25 MCG (1000 UT) tablet Take 1,000 Units by mouth daily.    Yes [provider]  diphenoxylate-atropine (LOMOTIL) 2.5-0.025 MG tablet Take 1 tablet by mouth 4 (four) times daily as needed for diarrhea or loose stools. 01/29/21  Yes Gorsuch, Ni, MD  famotidine (PEPCID) 20 MG tablet TAKE 1 TABLET BY MOUTH TWICE A DAY Patient taking differently: Take 20 mg by mouth 2 (two) times daily. 02/18/20  Yes Gorsuch, Ni, MD  furosemide (LASIX) 20 MG tablet Take 20 mg by mouth daily. 11/25/20  Yes [provider]  HYDROcodone bit-homatropine (HYCODAN) 5-1.5 MG/5ML syrup Take 5 mLs by mouth every 6 (six) hours as needed for cough. 01/13/21  Yes Icard, Octavio Graves, DO  HYDROcodone-acetaminophen (NORCO) 10-325 MG tablet Take 1 tablet by mouth every 6 (six) hours as needed for moderate pain.   Yes [provider]  lidocaine-prilocaine (  EMLA) cream Apply 1 application topically as needed (prior chemo/IBIG). Patient taking differently: Apply 1 application topically as needed (prior chemo/IBIG). Prn for mild pain 09/15/20  Yes Gorsuch, Ni, MD  loperamide (IMODIUM) 2 MG capsule Take 2 capsules (4 mg total) by mouth 4 (four) times daily. 07/24/20  Yes Gorsuch, Ernst Spell, MD  meclizine (ANTIVERT) 12.5 MG tablet Take 12.5 mg by mouth 3 (three) times daily as needed for dizziness.   Yes [provider]  metFORMIN (GLUCOPHAGE-XR) 500 MG 24 hr tablet Take 500 mg by mouth every evening.   Yes [provider]  midodrine (PROAMATINE) 5 MG tablet Take 3 tablets (15 mg total) by mouth 3 (three) times daily with meals. 11/06/20  Yes Maryjane Hurter, MD  montelukast (SINGULAIR) 10 MG tablet TAKE 1 TABLET BY MOUTH EVERY DAY Patient taking differently: Take 10 mg by mouth daily. 01/15/21  Yes Spero Geralds, MD  nitroGLYCERIN  (NITROSTAT) 0.4 MG SL tablet PLACE 1 TABLET UNDER THE TONGUE EVERY 5 MINUTES X 3 DOSES AS NEEDED FOR CHEST PAIN *MAX 3 DOSES* Patient taking differently: Place 0.4 mg under the tongue every 5 (five) minutes as needed for chest pain. PLACE 1 TABLET UNDER THE TONGUE EVERY 5 MINUTES X 3 DOSES AS NEEDED FOR CHEST PAIN *MAX 3 DOSES* 08/18/20  Yes Freada Bergeron, MD  oxybutynin (DITROPAN) 5 MG tablet Take 5 mg by mouth 2 (two) times daily as needed for bladder spasms. 11/16/20  Yes [provider]  pregabalin (LYRICA) 200 MG capsule TAKE 1 CAPSULE (200 MG TOTAL) BY MOUTH 3 (THREE) TIMES DAILY. 09/02/19  Yes Suzzanne Cloud, NP  rosuvastatin (CRESTOR) 5 MG tablet Take 1 tablet (5 mg total) by mouth daily. 07/09/20  Yes Freada Bergeron, MD  sildenafil (VIAGRA) 100 MG tablet Take 0.5 tablets (50 mg total) by mouth as needed for erectile dysfunction (30 mintues prior to sexual intercourse). 04/08/20  Yes Burtis Junes, NP  zolpidem (AMBIEN) 10 MG tablet Take 10 mg by mouth at bedtime as needed for sleep. 03/09/20  Yes [provider]  tamsulosin (FLOMAX) 0.4 MG CAPS capsule Take 1 capsule (0.4 mg total) by mouth daily. Patient not taking: Reported on 02/01/2021 12/06/19   Charlynne Cousins, MD     Family History  Problem Relation Age of Onset   Lung cancer Mother 71   Cancer Mother        lung   Heart failure Father 84   Heart disease Father     Social History   Socioeconomic History   Marital status: Married    Spouse name: [name]   Number of children: 1   Years of education: College   Highest education level: Not on file  Occupational History   Occupation: retired    Fish farm manager: [employer]    Comment: [employer]  Tobacco Use   Smoking status: Former    Packs/day: 1.00    Years: 44.00    Pack years: 44.00    Types: Cigarettes    Quit date: 04/23/2016    Years since quitting: 4.7   Smokeless tobacco: Never  Vaping Use   Vaping Use: Never used   Substance and Sexual Activity   Alcohol use: Not Currently    Alcohol/week: 0.0 standard drinks   Drug use: No   Sexual activity: Yes  Other Topics Concern   Not on file  Social History Narrative   Patient lives at home with wife.   Caffeine Use: 15 cups  of caffeine weekly   Social Determinants of Health   Financial Resource Strain: Not on file  Food Insecurity: Not on file  Transportation Needs: Not on file  Physical Activity: Not on file  Stress: Not on file  Social Connections: Not on file    Review of Systems: A 12 point ROS discussed and pertinent positives are indicated in the HPI above.  All other systems are negative.  Review of Systems  Constitutional:  Negative for fever.  HENT:  Negative for congestion.   Respiratory:  Negative for cough and shortness of breath.   Cardiovascular:  Negative for chest pain.  Gastrointestinal:  Negative for abdominal pain.  Musculoskeletal:  Positive for back pain.  Neurological:  Negative for headaches.  Psychiatric/Behavioral:  Negative for behavioral problems and confusion.    Vital Signs: BP 126/61 (BP Location: Right Arm)    Pulse 60    Temp 98.5 F (36.9 C) (Oral)    Resp 20    SpO2 94%   Physical Exam Vitals and nursing note reviewed.  Constitutional:      Appearance: He is well-developed.  HENT:     Head: Normocephalic.  Cardiovascular:     Rate and Rhythm: Normal rate and regular rhythm.     Heart sounds: Normal heart sounds.  Pulmonary:     Effort: Pulmonary effort is normal.     Breath sounds: Normal breath sounds.  Musculoskeletal:        General: Normal range of motion.     Cervical back: Normal range of motion.  Skin:    General: Skin is dry.  Neurological:     Mental Status: He is alert and oriented to person, place, and time.    Imaging: DG Chest 2 View  Result Date: 02/01/2021 CLINICAL DATA:  Altered mental status. EXAM: CHEST - 2 VIEW COMPARISON:  Chest x-ray 12/08/2020. FINDINGS: Right chest  port catheter tip projects over the distal SVC. The heart is mildly enlarged, unchanged. There is some minimal interstitial and patchy opacities in both lung bases, left greater than right. There is no pleural effusion or pneumothorax. There is a stable calcified nodule in the right mid lung. No acute fractures are identified. IMPRESSION: 1. Cardiomegaly with mild interstitial edema. Infection is not excluded in the left lung base. Electronically Signed   By: Ronney Asters M.D.   On: 02/01/2021 17:46   CT Head Wo Contrast  Result Date: 02/01/2021 CLINICAL DATA:  Mental status change, unknown cause EXAM: CT HEAD WITHOUT CONTRAST TECHNIQUE: Contiguous axial images were obtained from the base of the skull through the vertex without intravenous contrast. COMPARISON:  CT head 11/02/2020 BRAIN: BRAIN Patchy and confluent areas of decreased attenuation are noted throughout the deep and periventricular white matter of the cerebral hemispheres bilaterally, compatible with chronic microvascular ischemic disease. No evidence of large-territorial acute infarction. No parenchymal hemorrhage. No mass lesion. No extra-axial collection. No mass effect or midline shift. No hydrocephalus. Basilar cisterns are patent. Vascular: No hyperdense vessel. Atherosclerotic calcifications are present within the cavernous internal carotid arteries. Skull: No acute fracture or focal lesion. Sinuses/Orbits: Paranasal sinuses and mastoid air cells are clear. The orbits are unremarkable. Other: None. IMPRESSION: No acute intracranial abnormality. Electronically Signed   By: Iven Finn M.D.   On: 02/01/2021 18:56   CT CHEST WO CONTRAST  Result Date: 02/02/2021 CLINICAL DATA:  Pneumonia. EXAM: CT CHEST WITHOUT CONTRAST TECHNIQUE: Multidetector CT imaging of the chest was performed following the standard protocol without IV contrast.  COMPARISON:  chest x-ray earlier same day.  Chest CT 12/02/2020 FINDINGS: Cardiovascular: Heart size  upper normal. Coronary artery calcification is evident. Mild atherosclerotic calcification is noted in the wall of the thoracic aorta. Right Port-A-Cath tip is positioned in the distal SVC. Mediastinum/Nodes: Stable upper normal subcarinal lymph node. No evidence for gross hilar lymphadenopathy although assessment is limited by the lack of intravenous contrast on the current study. The esophagus has normal imaging features. There is no axillary lymphadenopathy. Similar appearance of subpectoral lymph nodes bilaterally. Lungs/Pleura: Clear progression of the left upper lobe pulmonary nodule now measuring 1.6 cm in the same dimension measured previously at 1.2 cm. Patchy and nodular airspace disease in the left lower lobe is progressive in the interval with associated volume loss. Fine detail is obscured by breathing motion. Dependent collapse/consolidative opacity is seen in both lungs, left greater than right no substantial pleural effusion. Upper Abdomen: Liver parenchyma heterogeneous on noncontrast imaging. Prominent spleen is similar to prior but incompletely visualized. Musculoskeletal: No worrisome lytic or sclerotic osseous abnormality. IMPRESSION: 1. Clear progression of the left upper lobe pulmonary nodule, now measuring 1.6 cm in the same dimension that was previously measured at 1.2 cm. 2. Interval progression of patchy and nodular airspace disease in the left lower lobe with associated volume loss. Imaging features likely related to infectious/inflammatory etiology. 3. Aortic Atherosclerosis (ICD10-I70.0). Electronically Signed   By: Misty Stanley M.D.   On: 02/02/2021 11:07   CT LUMBAR SPINE WO CONTRAST  Result Date: 02/02/2021 CLINICAL DATA:  70 year old male with lung cancer undergoing treatment. Increased falls. Right side low back pain. Altered mental status. EXAM: CT LUMBAR SPINE WITHOUT CONTRAST TECHNIQUE: Multidetector CT imaging of the lumbar spine was performed without intravenous contrast  administration. Multiplanar CT image reconstructions were also generated. COMPARISON:  CT chest and pelvis today reported separately. Lumbar MRI 04/13/2015. CT Abdomen and Pelvis 02/16/2020. FINDINGS: Segmentation: Normal. Alignment: Stable lumbar lordosis since January. No spondylolisthesis. Vertebrae: Round lucent/lytic lesion of the L3 anterior superior vertebral body (coronal image 17 and series 4, image 55) has partially sclerotic margin and abuts the superior endplate, but there was no is new since January with no evidence of developing Schmorl's node at that time. The lesion is about 16 mm diameter. And background bone mineralization appears more heterogeneous since January. But there is no other discrete osseous lesion identified. Chronic sclerosis of the medial left iliac bone appears stable and benign. Paraspinal and other soft tissues: Aortoiliac calcified atherosclerosis. Left Common iliac artery vascular stent. Some oral contrast is present in bowel. Lumbar paraspinal soft tissues remain within normal limits. Disc levels: T12-L1:  Negative. L1-L2:  Mild disc bulging.  No stenosis. L2-L3: Circumferential disc bulge appears progressed since 2017, with broad-based posterior component. However, there is no significant stenosis. L3-L4: Minor disc bulging appears increased since 2017. No stenosis. L4-L5: Mild disc bulging appears increased since 2017. No stenosis. L5-S1:  Mild disc bulging appears stable since 2017.  No stenosis. IMPRESSION: 1. A 16 mm lucent lesion of the L3 vertebral body is new since January and more suspicious for osseous metastatic disease than a degenerative Schmorl's node. Lumbar MRI (without and with contrast preferred) may characterize further. 2. No lumbar pathologic fracture or other acute finding by CT. Mild for age lumbar spine degeneration. Electronically Signed   By: Genevie Ann M.D.   On: 02/02/2021 11:10   CT PELVIS WO CONTRAST  Result Date: 02/02/2021 CLINICAL DATA:  Recent  falls. EXAM: CT PELVIS WITHOUT CONTRAST  TECHNIQUE: Multidetector CT imaging of the pelvis was performed following the standard protocol without intravenous contrast. COMPARISON:  November 02, 2020. FINDINGS: Urinary Tract: Bladder is decompressed by a suprapubic catheter. No abnormality visualized. Bowel:  Unremarkable visualized pelvic bowel loops. Vascular/Lymphatic: No pathologically enlarged lymph nodes. Aortoiliac atherosclerotic vascular disease. Reproductive:  Unchanged mild prostatomegaly. Other:  Unchanged splenomegaly, partially visualized. Musculoskeletal: No acute or significant osseous findings. IMPRESSION: 1. No acute osseous abnormality. 2. Unchanged splenomegaly. 3. Aortic Atherosclerosis (ICD10-I70.0). Electronically Signed   By: Titus Dubin M.D.   On: 02/02/2021 11:06   MR LUMBAR SPINE WO CONTRAST  Result Date: 02/02/2021 CLINICAL DATA:  Lumbar radiculopathy, symptoms persist with > 6 wks treatment EXAM: MRI LUMBAR SPINE WITHOUT CONTRAST TECHNIQUE: Multiplanar, multisequence MR imaging of the lumbar spine was performed. No intravenous contrast was administered. COMPARISON:  CT of the lumbar spine 02/02/2021. MRI of the lumbar spine 04/12/2005. FINDINGS: The patient refused postcontrast imaging. Segmentation: Standard segmentation is assumed. Alignment:  Normal Vertebrae: Approximately 2 cm round lesion within the anterior/superior L3 vertebral body, which is T1 hypointense and T2/STIR intermediate in signal with surrounding STIR hyperintense edema. Approximately 10 mm L2 and 8 mm L1 lesions, which are T1 and T2 hypointense (series 3 and 4, image 9) without appreciable STIR hyperintensity. Conus medullaris and cauda equina: Conus extends to the T2 level level. Conus appears normal. Paraspinal and other soft tissues: Unremarkable. Disc levels: T12-L1: No significant disc protrusion, foraminal stenosis, or canal stenosis. L1-L2: No significant disc protrusion, foraminal stenosis, or canal  stenosis. L2-L3: No significant disc protrusion, foraminal stenosis, or canal stenosis. L3-L4: Mild facet arthropathy and slight disc bulge without significant stenosis. L4-L5: Small broad disc bulge and mild bilateral facet arthropathy. No significant canal or foraminal stenosis. L5-S1: No significant disc protrusion, foraminal stenosis, or canal stenosis. IMPRESSION: 1. Approximately 2 cm round lesion within the anterior/superior L3 vertebral body, concerning for metastatic disease in this patient with known lung cancer. Surrounding marrow edema may be reactive or represent superimposed pathologic fracture. 2. Approximately 10 mm L2 and 8 mm L1 lesions are indeterminate. The patient refused contrast on this study. If the patient is willing, postcontrast imaging may be helpful to better evaluate. Alternatively, follow-up MRI could assess stability. 3. No significant canal or foraminal stenosis. Electronically Signed   By: Margaretha Sheffield M.D.   On: 02/02/2021 15:08   DG Chest Port 1 View  Result Date: 02/02/2021 CLINICAL DATA:  70 year old male with altered mental status and shortness of breath. History of lymphoma. And history of malignant cells on left upper lobe needle biopsy in October. EXAM: PORTABLE CHEST 1 VIEW COMPARISON:  Chest radiographs 02/01/2021 and earlier. FINDINGS: Portable AP semi upright view at 0740 hours. Stable right chest power port. Larger lung volumes. Normal cardiac size and mediastinal contours. Visualized tracheal air column is within normal limits. Left mid lung nodule with adjacent surgical clip appears grossly stable from October CT (please see that report). No superimposed pneumothorax, pulmonary edema, pleural effusion or new pulmonary opacity. No acute osseous abnormality identified. IMPRESSION: 1. Grossly stable left lung nodule corresponding to known non-small cell carcinoma. 2. Larger lung volumes with no new cardiopulmonary abnormality. Electronically Signed   By: Genevie Ann M.D.   On: 02/02/2021 07:56    Labs:  CBC: Recent Labs    01/29/21 0948 02/01/21 1617 02/02/21 0427 02/03/21 0439  WBC 11.1* 9.7 10.1 3.9*  HGB 11.2* 12.0* 11.6* 10.9*  HCT 36.1* 38.4* 36.3* 34.3*  PLT 68* 66*  74* 55*    COAGS: Recent Labs    02/15/20 0455 11/03/20 0344  INR 1.3* 2.0*  APTT 38*  --     BMP: Recent Labs    01/29/21 0948 02/01/21 1617 02/02/21 0427 02/03/21 0439  NA 140 134* 136 139  K 4.5 4.1 5.7* 3.7  CL 106 100 105 107  CO2 26 26 25 27   GLUCOSE 131* 115* 80 113*  BUN 11 14 14 16   CALCIUM 8.6* 8.9 8.5* 8.8*  CREATININE 0.80 0.98 0.87 0.82  GFRNONAA >60 >60 >60 >60    LIVER FUNCTION TESTS: Recent Labs    01/01/21 0746 01/29/21 0948 02/01/21 1617 02/03/21 0439  BILITOT 0.3 0.3 0.3 0.6  AST 34 20 17 13*  ALT 39 31 25 17   ALKPHOS 122 125 86 71  PROT 6.3* 6.1* 6.6 5.4*  ALBUMIN 3.8 3.6 3.6 2.7*    Assessment and Plan:  70 y.o. male inpatient. 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 Presented to the ED at [facility] on 12.19.22 with AMS after suprapubic catheter change in the urology office and cough.  Found to be afebrile and hypotensive with PNA.  Due to ongoing lumbar radiculopathy a MR lumbar was ordered. MR Lumbar from 12.20.22 reads Approximately 2 cm round lesion within the anterior/superior L3  vertebral body, concerning for metastatic disease in this patient with known lung cancer. Surrounding marrow edema may be reactive or represent superimposed pathologic fracture.L3 vertebral body for biopsy.  Team is requesting a T3 lesion for further determination of metastases of lung cancer versus new primary.   Calcium 8.8, Albumin 2.7, AST , BNP 114.3, CRP 2.5, WBC is 3.9. All other labs and medications are within acceptable parameters. Allergies include codeine.  IR consulted for possible L3 lesion biopsy. Case has been reviewed and procedure approved by Dr. Serafina Royals.  Patient tentatively  scheduled for 11.22.22.  Team instructed to: Keep Patient to be NPO after midnight  IR will call patient when ready.  Risks and benefits of L3 lesion biopsy was discussed with the patient and/or patient's family including, but not limited to bleeding, infection, damage to adjacent structures or low yield requiring additional tests.  All of the questions were answered and there is agreement to proceed.  Consent signed and in chart.   Thank you for this interesting consult.  I greatly enjoyed meeting KAEDAN RICHERT and look forward to participating in their care.  A copy of this report was sent to the requesting provider on this date.  Electronically Signed: Jacqualine Mau, NP 02/03/2021, 1:44 PM   I spent a total of 40 Minutes  in face to face in clinical consultation, greater than 50% of which was counseling/coordinating care for L3 lesion biopsy

## 2021-02-03 NOTE — TOC Initial Note (Signed)
Transition of Care [facility]) - Initial/Assessment Note    Patient Details  Name: Ryan Copeland MRN: [id number] Date of Birth: November 29, 1950  Transition of Care [facility]) CM/SW Contact:    Tom-Johnson, Renea Ee, RN Phone Number: 02/03/2021, 1:55 PM  Clinical Narrative:                  Transition of Care [facility]) Screening Note   Patient Details  Name: Ryan Copeland Date of Birth: 1950/08/27   Transition of Care [facility]) CM/SW Contact:    Tom-Johnson, Renea Ee, RN Phone Number: 02/03/2021, 1:55 PM  CM spoke with patient at bedside about needs for post hospital transition. Patient is admitted for AMS and PNA. States he lives at home with his wife and has two children. Wife transports to and from appointments. Has a walker and shower seat at home. PCP is Bing Matter, NP and [facility] [place]. Transition of Care Department [facility]) has reviewed patient and no TOC needs have been identified at this time. No followup from PT/OT. We will continue to monitor patient advancement through interdisciplinary progression rounds. If new patient transition needs arise, please place a TOC consult.    Expected Discharge Plan: Home/Self Care Barriers to Discharge: Continued Medical Work up   Patient Goals and CMS Choice Patient states their goals for this hospitalization and ongoing recovery are:: To go home CMS Medicare.gov Compare Post Acute Care list provided to:: Patient Choice offered to / list presented to : Patient  Expected Discharge Plan and Services Expected Discharge Plan: Home/Self Care   Discharge Planning Services: CM Consult Post Acute Care Choice: NA Living arrangements for the past 2 months: Single Family Home                 DME Arranged: N/A DME Agency: NA         HH Agency: NA        Prior Living Arrangements/Services Living arrangements for the past 2 months: [place] Lives with:: Spouse Patient language and need for interpreter  reviewed:: Yes Do you feel safe going back to the place where you live?: Yes      Need for Family Participation in Patient Care: Yes (Comment) Care giver support system in place?: Yes (comment) Current home services: DME (Walker, shower seat) Criminal Activity/Legal Involvement Pertinent to Current Situation/Hospitalization: No - Comment as needed  Activities of Daily Living Home Assistive Devices/Equipment: Walker (specify type) ADL Screening (condition at time of admission) Patient's cognitive ability adequate to safely complete daily activities?: Yes Is the patient deaf or have difficulty hearing?: No Does the patient have difficulty seeing, even when wearing glasses/contacts?: No Does the patient have difficulty concentrating, remembering, or making decisions?: No Patient able to express need for assistance with ADLs?: Yes Does the patient have difficulty dressing or bathing?: No Independently performs ADLs?: Yes (appropriate for developmental age) Does the patient have difficulty walking or climbing stairs?: No Weakness of Legs: None Weakness of Arms/Hands: None  Permission Sought/Granted Permission sought to share information with : Case Manager, Family Supports Permission granted to share information with : Yes, Verbal Permission Granted              Emotional Assessment Appearance:: Appears stated age Attitude/Demeanor/Rapport: Engaged, Gracious Affect (typically observed): Accepting, Appropriate, Calm, Hopeful Orientation: : Oriented to Self, Oriented to Place Alcohol / Substance Use: Not Applicable Psych Involvement: No (comment)  Admission diagnosis:  Back pain [M54.9] SOB (shortness of breath) [R06.02] Community acquired pneumonia  of right lower lobe of lung [J18.9] AMS (altered mental status) [R41.82] Patient Active Problem List   Diagnosis Date Noted   Indwelling catheter present on admission 02/02/2021   AMS (altered mental status) 02/01/2021   Aortic root  dilation ([place]) 01/03/2021   Bilateral leg edema 10/27/2020   Physical debility 10/27/2020   Lung nodule 09/23/2020   Non-small cell carcinoma of lung, left ([place]) 09/18/2020   Head and neck lymphadenopathy 03/00/9233   Complicated UTI (urinary tract infection) 07/18/2020   Gum lesion 05/29/2020   Acute lower UTI 02/16/2020   Bladder pain 02/16/2020   Chronic hypotension 02/16/2020   Vitamin B12 deficiency 01/01/2020   Hypotension due to drugs 01/01/2020   Protein-calorie malnutrition, moderate ([place]) 01/01/2020   Septic shock ([place]) 12/15/2019   Community acquired pneumonia 11/28/2019   Cerebral vascular disease 11/25/2019   CIDP (chronic inflammatory demyelinating polyneuropathy) ([place]) 11/25/2019   Other fatigue 10/25/2019   Deficiency anemia 10/25/2019   Dizziness 10/22/2019   Gait abnormality 10/22/2019   Hx of nonmelanoma skin cancer 03/15/2019   Essential hypertension 04/24/2018   Influenza A 04/17/2018   Sepsis ([place]) 04/17/2018   Chronic diastolic CHF (congestive heart failure) ([place]) 04/17/2018   Skin rash 08/07/2017   Port-A-Cath in place 08/04/2017   Chronic apical periodontitis 04/26/2017   Retained dental roots 04/26/2017   Dental caries 04/26/2017   Chronic periodontitis 04/26/2017   Loose, teeth 04/26/2017   Peripheral polyneuropathy 04/14/2017   COPD mixed type ([place]) 02/23/2017   Chronic sinusitis 01/27/2017   Splenomegaly, congestive, chronic 12/29/2016   Diarrhea 12/01/2016   Poor dentition 12/01/2016   Benign mole 12/01/2016   Pancytopenia, acquired ([place]) 11/03/2016   Odynophagia 01/15/2016   Polycythemia, secondary 06/26/2015   Quality of life palliative care encounter 06/26/2015   Atherosclerosis of extremity with intermittent claudication ([place]) 01/23/2015   Lateral epicondylitis of left elbow    ECRB (extensor carpi radialis brevis) tenosynovitis    Anxiety 01/30/2014   Preoperative clearance 01/30/2014   Thrombocytopenia ([place]) 01/30/2014   Diabetes  mellitus without complication ([place]) 00/76/2263   Hypogammaglobulinemia, acquired ([place]) 12/26/2012   Hereditary and idiopathic peripheral neuropathy 08/20/2012   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[place]) 08/20/2012   CAD (coronary artery disease) 07/29/2011   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   MI, acute, non ST segment elevation ([place])    Hyperlipidemia    PVD (peripheral vascular disease) ([place])    GERD 09/27/2008   SHOULDER STRAIN, RIGHT 09/27/2008   PCP:  [name]., PA-C Pharmacy:   [id number] [place], [address] [address] Phone: [phone] Fax: [phone]  [id number] - [place], [place] [address] [place] [address] Phone: [phone] Fax: [phone]     Social Determinants of Health (SDOH) Interventions    Readmission Risk Interventions Readmission Risk Prevention Plan 02/20/2020 12/03/2019  Transportation Screening Complete Complete  Medication Review Press photographer) Complete Complete  PCP or Specialist appointment within 3-5 days of discharge - Complete  HRI or [place] Complete Complete  SW Recovery Care/Counseling Consult Complete Complete  Palliative Care Screening Not Applicable Not [place] Not Applicable Not Applicable  Some recent data might be hidden

## 2021-02-03 NOTE — Plan of Care (Signed)
  Problem: Health Behavior/Discharge Planning: Goal: Ability to manage health-related needs will improve Outcome: Progressing   Problem: Clinical Measurements: Goal: Ability to maintain clinical measurements within normal limits will improve Outcome: Progressing Goal: Will remain free from infection Outcome: Progressing   

## 2021-02-04 ENCOUNTER — Inpatient Hospital Stay (HOSPITAL_COMMUNITY): Payer: Medicare Other

## 2021-02-04 ENCOUNTER — Other Ambulatory Visit: Payer: Self-pay | Admitting: Radiology

## 2021-02-04 ENCOUNTER — Encounter: Payer: Self-pay | Admitting: Hematology and Oncology

## 2021-02-04 DIAGNOSIS — C3492 Malignant neoplasm of unspecified part of left bronchus or lung: Secondary | ICD-10-CM

## 2021-02-04 DIAGNOSIS — R410 Disorientation, unspecified: Secondary | ICD-10-CM

## 2021-02-04 DIAGNOSIS — C8308 Small cell B-cell lymphoma, lymph nodes of multiple sites: Secondary | ICD-10-CM

## 2021-02-04 LAB — CBC WITH DIFFERENTIAL/PLATELET
Abs Immature Granulocytes: 0.06 10*3/uL (ref 0.00–0.07)
Basophils Absolute: 0 10*3/uL (ref 0.0–0.1)
Basophils Relative: 0 %
Eosinophils Absolute: 0.1 10*3/uL (ref 0.0–0.5)
Eosinophils Relative: 3 %
HCT: 34.8 % — ABNORMAL LOW (ref 39.0–52.0)
Hemoglobin: 11.2 g/dL — ABNORMAL LOW (ref 13.0–17.0)
Immature Granulocytes: 2 %
Lymphocytes Relative: 26 %
Lymphs Abs: 0.7 10*3/uL (ref 0.7–4.0)
MCH: 28.1 pg (ref 26.0–34.0)
MCHC: 32.2 g/dL (ref 30.0–36.0)
MCV: 87.4 fL (ref 80.0–100.0)
Monocytes Absolute: 0.4 10*3/uL (ref 0.1–1.0)
Monocytes Relative: 13 %
Neutro Abs: 1.6 10*3/uL — ABNORMAL LOW (ref 1.7–7.7)
Neutrophils Relative %: 56 %
Platelets: 48 10*3/uL — ABNORMAL LOW (ref 150–400)
RBC: 3.98 MIL/uL — ABNORMAL LOW (ref 4.22–5.81)
RDW: 16.6 % — ABNORMAL HIGH (ref 11.5–15.5)
WBC: 2.8 10*3/uL — ABNORMAL LOW (ref 4.0–10.5)
nRBC: 0 % (ref 0.0–0.2)

## 2021-02-04 LAB — COMPREHENSIVE METABOLIC PANEL
ALT: 16 U/L (ref 0–44)
AST: 15 U/L (ref 15–41)
Albumin: 2.8 g/dL — ABNORMAL LOW (ref 3.5–5.0)
Alkaline Phosphatase: 66 U/L (ref 38–126)
Anion gap: 5 (ref 5–15)
BUN: 13 mg/dL (ref 8–23)
CO2: 25 mmol/L (ref 22–32)
Calcium: 8.5 mg/dL — ABNORMAL LOW (ref 8.9–10.3)
Chloride: 108 mmol/L (ref 98–111)
Creatinine, Ser: 0.85 mg/dL (ref 0.61–1.24)
GFR, Estimated: >60 mL/min (ref >60)
Glucose, Bld: 93 mg/dL (ref 70–99)
Potassium: 3.3 mmol/L — ABNORMAL LOW (ref 3.5–5.1)
Sodium: 138 mmol/L (ref 135–145)
Total Bilirubin: 0.8 mg/dL (ref 0.3–1.2)
Total Protein: 5.5 g/dL — ABNORMAL LOW (ref 6.5–8.1)

## 2021-02-04 LAB — MRSA NEXT GEN BY PCR, NASAL: MRSA by PCR Next Gen: NOT DETECTED

## 2021-02-04 LAB — PROCALCITONIN: Procalcitonin: <0.1 ng/mL

## 2021-02-04 LAB — BRAIN NATRIURETIC PEPTIDE: B Natriuretic Peptide: 452.7 pg/mL — ABNORMAL HIGH (ref 0.0–100.0)

## 2021-02-04 LAB — MAGNESIUM: Magnesium: 1.8 mg/dL (ref 1.7–2.4)

## 2021-02-04 LAB — C-REACTIVE PROTEIN: CRP: 0.8 mg/dL (ref <1.0)

## 2021-02-04 MED ORDER — FENTANYL CITRATE (PF) 100 MCG/2ML IJ SOLN
INTRAMUSCULAR | Status: AC
  Filled 2021-02-04: qty 2

## 2021-02-04 MED ORDER — FENTANYL CITRATE (PF) 100 MCG/2ML IJ SOLN
INTRAMUSCULAR | Status: DC | PRN
  Administered 2021-02-04: 50 ug via INTRAVENOUS

## 2021-02-04 MED ORDER — MIDAZOLAM HCL 2 MG/2ML IJ SOLN
INTRAMUSCULAR | Status: AC | PRN
  Administered 2021-02-04 (×2): 1 mg via INTRAVENOUS
  Administered 2021-02-04: .5 mg via INTRAVENOUS
  Administered 2021-02-04: 1 mg via INTRAVENOUS

## 2021-02-04 MED ORDER — MIDAZOLAM HCL 2 MG/2ML IJ SOLN
INTRAMUSCULAR | Status: AC
  Filled 2021-02-04: qty 2

## 2021-02-04 MED ORDER — HYDROCODONE-ACETAMINOPHEN 5-325 MG PO TABS
1.0000 | ORAL_TABLET | Freq: Two times a day (BID) | ORAL | Status: DC | PRN
  Administered 2021-02-05: 1 via ORAL
  Filled 2021-02-04: qty 1

## 2021-02-04 MED ORDER — POTASSIUM CHLORIDE CRYS ER 20 MEQ PO TBCR
40.0000 meq | EXTENDED_RELEASE_TABLET | Freq: Once | ORAL | Status: AC
  Administered 2021-02-04: 40 meq via ORAL
  Filled 2021-02-04: qty 2

## 2021-02-04 MED ORDER — FENTANYL CITRATE (PF) 100 MCG/2ML IJ SOLN
INTRAMUSCULAR | Status: AC | PRN
  Administered 2021-02-04 (×2): 25 ug via INTRAVENOUS
  Administered 2021-02-04 (×2): 50 ug via INTRAVENOUS

## 2021-02-04 MED ORDER — MIDAZOLAM HCL 2 MG/2ML IJ SOLN
INTRAMUSCULAR | Status: DC | PRN
  Administered 2021-02-04: .5 mg via INTRAVENOUS

## 2021-02-04 MED ORDER — LACTATED RINGERS IV SOLN: INTRAVENOUS | Status: AC

## 2021-02-04 MED ORDER — IOHEXOL 350 MG/ML SOLN
80.0000 mL | Freq: Once | INTRAVENOUS | Status: AC | PRN
  Administered 2021-02-04: 80 mL via INTRAVENOUS

## 2021-02-04 MED ORDER — LIDOCAINE HCL 1 % IJ SOLN
INTRAMUSCULAR | Status: AC
  Filled 2021-02-04: qty 10

## 2021-02-04 NOTE — Progress Notes (Signed)
PROGRESS NOTE                                                                                                                                                                                                             Patient Demographics:    Ryan Copeland, is a 70 y.o. male, DOB - March 07, 1950, GYJ:[id number]  Outpatient Primary MD for the patient is [name]., PA-C    LOS - 2  Admit date - 02/01/2021    Chief Complaint  Patient presents with   Altered Mental Status       Brief Narrative (HPI from H&P)   Ryan Copeland is a 71 y.o. 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   Subjective:   Patient in bed, appears comfortable, denies any headache, no fever, no chest pain or pressure, no shortness of breath , no abdominal pain. No new focal weakness. + Lower Back pain.   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  Most likely source of his encephalopathy is ongoing lower back pain with lack of good quality sleep and being on narcotics for that.   I do not think he has active pneumonia will titrate off antibiotics on 02/04/2021 if cultures remain negative, brain MRI is nonacute, encephalopathy is better with reduced use of narcotic however if his pain is not better narcotic will be used with full understanding that it can make him slightly more confused this was explained to patient's wife on 02/03/2021 and 02/04/2021.  For now on tramadol as needed.  Was informed by wife that narcotics were making him  more confused at home.   Ongoing lower back pain for the last 1 week.  CT and MRI of L-spine noted with possible L2-3 metastatic lesion, discussed with neurosurgery, oncology and radiation oncology team, plan as IR to conduct biopsy of the lesion on 02/04/2021, may require radiation treatment to his back for pain control but will defer that to the  oncology.  Case discussed with oncology team on 02/04/2021.  Non-small cell left lung cancer.  Recently completed radiation treatment.  Under the care of Dr. Valeta Harms pulmonary and Dr. Lisbeth Renshaw radiation oncology, CT scan noted.  Discussed with Dr. Valeta Harms, will follow in the office.  Prognosis does not appear excellent.  Small cell B-cell lymphoma (CLL) - under the care of Dr. Alvy Bimler Case discussed with her, hold Calquence due to possibility of acute infection pneumonia/UTI.  We will continue to monitor.  Hypogammaglobinemia requiring monthly IVIG treatment.  Supportive care.  Chronic indwelling Foley catheter.  Catheter changed on 02/01/2021.  Monitor.  UA appears stable.  History of gout.  Continue acyclovir.  Dyslipidemia.  On statin.   History of CAD with LAD stent.  Aortic root dilation.  Blood pressure low hence no beta-blocker, severe baseline thrombocytopenia hence no aspirin.  Continue statin for supportive care with outpatient PCP and cardiology follow-up  Hypokalemia.  Replaced.    Chronic moderate to severe thrombocytopenia.  Monitor.    On 02/03/2021 his care was coordinated with discussions with following physicians  Dr. Alvy Bimler, Dr. Sonny Dandy, Dr. Lisbeth Renshaw, oncology PA Ms. Worthy Flank, Dr. June Leap, Dr. Dominica Severin cram neurosurgery.       Condition - Extremely Guarded  Family Communication  : Wife [name] [phone] on 02/02/2021, 02/03/2021 x 2, 02/04/21  Code Status :  DNR  Consults  :  Oncology, radiation oncology, IR, pulmonary over the phone  PUD Prophylaxis :  Pepcid   Procedures  :     L 3 Biopsy due 02/04/21  MRI  Brain - non acute  CT Chest - 1. Clear progression of the left upper lobe pulmonary nodule, now measuring 1.6 cm in the same dimension that was previously measured at 1.2 cm. 2. Interval progression of patchy and nodular airspace disease in the left lower lobe with associated volume loss. Imaging features likely related to infectious/inflammatory etiology. 3. Aortic Atherosclerosis  MRI L Spine -  1. Approximately 2 cm round lesion within the anterior/superior L3 vertebral body, concerning for metastatic disease in this patient with known lung cancer. Surrounding marrow edema may be reactive or represent superimposed pathologic fracture. 2. Approximately 10 mm L2 and 8 mm L1 lesions are indeterminate. The patient refused contrast on this study. If the patient is willing, postcontrast imaging may be helpful to better evaluate. Alternatively, follow-up MRI could assess stability. 3. No significant canal or foraminal stenosis.      Disposition Plan  :    Status is: Inpt  DVT Prophylaxis  :     SCDs Start: 02/01/21 2047   Lab Results  Component Value Date   PLT 48 (L) 02/04/2021    Diet :  Diet Order             Diet NPO time specified Except for: Sips with Meds  Diet effective midnight                    Inpatient Medications  Scheduled Meds:  acyclovir  400 mg Oral BID   allopurinol  300 mg Oral Daily   benzonatate  100 mg Oral TID   Chlorhexidine Gluconate Cloth  6 each Topical Daily   famotidine  20 mg Oral BID   midodrine  15 mg Oral TID WC   montelukast  10 mg Oral Daily   potassium chloride  40 mEq Oral Once   rosuvastatin  5 mg Oral Daily   Continuous Infusions:  ceFEPime (MAXIPIME) IV 2  g (02/04/21 0535)   lactated ringers     PRN Meds:.acetaminophen, diphenoxylate-atropine, oxybutynin, sodium chloride flush, traMADol  Antibiotics  :    Anti-infectives (From admission, onward)    Start     Dose/Rate Route Frequency Ordered Stop   02/02/21 1000   vancomycin (VANCOCIN) IVPB 1000 mg/200 mL premix  Status:  Discontinued        1,000 mg 200 mL/hr over 60 Minutes Intravenous Every 12 hours 02/02/21 0008 02/04/21 0720   02/02/21 0200  ceFEPIme (MAXIPIME) 2 g in sodium chloride 0.9 % 100 mL IVPB        2 g 200 mL/hr over 30 Minutes Intravenous Every 8 hours 02/02/21 0008     02/01/21 2200  acyclovir (ZOVIRAX) tablet 400 mg        400 mg Oral 2 times daily 02/01/21 2108     02/01/21 1745  vancomycin (VANCOREADY) IVPB 1750 mg/350 mL        1,750 mg 175 mL/hr over 120 Minutes Intravenous  Once 02/01/21 1733 02/01/21 2255   02/01/21 1745  piperacillin-tazobactam (ZOSYN) IVPB 3.375 g        3.375 g 12.5 mL/hr over 240 Minutes Intravenous  Once 02/01/21 1733 02/01/21 2255        Time Spent in minutes  30   Lala Lund M.D on 02/04/2021 at 8:37 AM  To page go to [email or web address]   [facility] -  Office  [phone]  See all Orders from today for further details    Objective:   Vitals:   02/03/21 1702 02/03/21 2122 02/04/21 0420 02/04/21 0821  BP: (!) 148/58 133/74 126/77 (!) 115/56  Pulse: (!) 45 (!) 45 (!) 45 (!) 54  Resp: 16 17 18 18   Temp: 98 F (36.7 C) 97.6 F (36.4 C) 98.1 F (36.7 C) 97.6 F (36.4 C)  TempSrc: Oral Oral Oral Oral  SpO2: 98% 96% 97% 98%    Wt Readings from Last 3 Encounters:  01/29/21 78.4 kg  01/13/21 76.8 kg  01/04/21 76.5 kg     Intake/Output Summary (Last 24 hours) at 02/04/2021 0837 Last data filed at 02/03/2021 2300 Gross per 24 hour  Intake 1063.34 ml  Output 400 ml  Net 663.34 ml     Physical Exam  Awake Alert x 2, No new F.N deficits,   [place].AT,PERRAL Supple Neck, No JVD,   Symmetrical Chest wall movement, Good air movement bilaterally, CTAB RRR,No Gallops, Rubs or new Murmurs,  +ve B.Sounds, Abd Soft, No tenderness,   No Cyanosis,  suprapubic catheter      Data Review:    CBC Recent Labs  Lab 01/29/21 0948 02/01/21 1617 02/02/21 0427 02/03/21 0439  02/04/21 0252  WBC 11.1* 9.7 10.1 3.9* 2.8*  HGB 11.2* 12.0* 11.6* 10.9* 11.2*  HCT 36.1* 38.4* 36.3* 34.3* 34.8*  PLT 68* 66* 54* 55* 48*  MCV 88.9 89.7 89.9 89.3 87.4  MCH 27.6 28.0 28.7 28.4 28.1  MCHC 31.0 31.3 32.0 31.8 32.2  RDW 17.2* 17.1* 17.2* 17.0* 16.6*  LYMPHSABS 7.7* 6.7*  --  1.8 0.7  MONOABS 0.6 0.6  --  0.4 0.4  EOSABS 0.0 0.0  --  0.0 0.1  BASOSABS 0.0 0.0  --  0.0 0.0    Electrolytes Recent Labs  Lab 01/29/21 0948 02/01/21 1617 02/01/21 1618 02/02/21 0427 02/02/21 2137 02/03/21 0439 02/04/21 0252  NA 140 134*  --  136  --  139 138  K 4.5 4.1  --  5.7*  --  3.7 3.3*  CL 106 100  --  105  --  107 108  CO2 26 26  --  25  --  27 25  GLUCOSE 131* 115*  --  80  --  113* 93  BUN 11 14  --  14  --  16 13  CREATININE 0.80 0.98  --  0.87  --  0.82 0.85  CALCIUM 8.6* 8.9  --  8.5*  --  8.8* 8.5*  AST 20 17  --   --   --  13* 15  ALT 31 25  --   --   --  17 16  ALKPHOS 125 86  --   --   --  71 66  BILITOT 0.3 0.3  --   --   --  0.6 0.8  ALBUMIN 3.6 3.6  --   --   --  2.7* 2.8*  MG  --   --   --   --  2.0 1.7 1.8  CRP  --   --   --   --  3.7* 2.5* 0.8  PROCALCITON  --   --   --   --  <0.10 43.03 <0.10  LATICACIDVEN  --   --  1.2  --   --   --   --   AMMONIA  --  14  --   --   --   --   --   BNP  --   --   --   --  96.8 114.3* 452.7*    ------------------------------------------------------------------------------------------------------------------ No results for input(s): CHOL, HDL, LDLCALC, TRIG, CHOLHDL, LDLDIRECT in the last 72 hours.  Lab Results  Component Value Date   HGBA1C 4.6 (L) 11/02/2020    No results for input(s): TSH, T4TOTAL, T3FREE, THYROIDAB in the last 72 hours.  Invalid input(s): FREET3 ------------------------------------------------------------------------------------------------------------------ ID Labs Recent Labs  Lab 01/29/21 0948 02/01/21 1617 02/01/21 1618 02/02/21 0427 02/02/21 2137 02/03/21 0439 02/04/21 0252   WBC 11.1* 9.7  --  10.1  --  3.9* 2.8*  PLT 68* 66*  --  54*  --  55* 48*  CRP  --   --   --   --  3.7* 2.5* 0.8  PROCALCITON  --   --   --   --  <0.10 43.03 <0.10  LATICACIDVEN  --   --  1.2  --   --   --   --   CREATININE 0.80 0.98  --  0.87  --  0.82 0.85   Cardiac Enzymes No results for input(s): CKMB, TROPONINI, MYOGLOBIN in the last 168 hours.  Invalid input(s): CK  Radiology Reports DG Chest 2 View  Result Date: 02/01/2021 CLINICAL DATA:  Altered mental status. EXAM: CHEST - 2 VIEW COMPARISON:  Chest x-ray 12/08/2020. FINDINGS: Right chest port catheter tip projects over the distal SVC. The heart is mildly enlarged, unchanged. There is some minimal interstitial and patchy opacities in both lung bases, left greater than right. There is no pleural effusion or pneumothorax. There is a stable calcified nodule in the right mid lung. No acute fractures are identified. IMPRESSION: 1. Cardiomegaly with mild interstitial edema. Infection is not excluded in the left lung base. Electronically Signed   By: Ronney Asters M.D.   On: 02/01/2021 17:46   CT Head Wo Contrast  Result Date: 02/01/2021 CLINICAL DATA:  Mental status change, unknown cause EXAM: CT HEAD WITHOUT CONTRAST TECHNIQUE: Contiguous axial images were obtained from the base of the  skull through the vertex without intravenous contrast. COMPARISON:  CT head 11/02/2020 BRAIN: BRAIN Patchy and confluent areas of decreased attenuation are noted throughout the deep and periventricular white matter of the cerebral hemispheres bilaterally, compatible with chronic microvascular ischemic disease. No evidence of large-territorial acute infarction. No parenchymal hemorrhage. No mass lesion. No extra-axial collection. No mass effect or midline shift. No hydrocephalus. Basilar cisterns are patent. Vascular: No hyperdense vessel. Atherosclerotic calcifications are present within the cavernous internal carotid arteries. Skull: No acute fracture or  focal lesion. Sinuses/Orbits: Paranasal sinuses and mastoid air cells are clear. The orbits are unremarkable. Other: None. IMPRESSION: No acute intracranial abnormality. Electronically Signed   By: Iven Finn M.D.   On: 02/01/2021 18:56   CT CHEST WO CONTRAST  Result Date: 02/02/2021 CLINICAL DATA:  Pneumonia. EXAM: CT CHEST WITHOUT CONTRAST TECHNIQUE: Multidetector CT imaging of the chest was performed following the standard protocol without IV contrast. COMPARISON:  chest x-ray earlier same day.  Chest CT 12/02/2020 FINDINGS: Cardiovascular: Heart size upper normal. Coronary artery calcification is evident. Mild atherosclerotic calcification is noted in the wall of the thoracic aorta. Right Port-A-Cath tip is positioned in the distal SVC. Mediastinum/Nodes: Stable upper normal subcarinal lymph node. No evidence for gross hilar lymphadenopathy although assessment is limited by the lack of intravenous contrast on the current study. The esophagus has normal imaging features. There is no axillary lymphadenopathy. Similar appearance of subpectoral lymph nodes bilaterally. Lungs/Pleura: Clear progression of the left upper lobe pulmonary nodule now measuring 1.6 cm in the same dimension measured previously at 1.2 cm. Patchy and nodular airspace disease in the left lower lobe is progressive in the interval with associated volume loss. Fine detail is obscured by breathing motion. Dependent collapse/consolidative opacity is seen in both lungs, left greater than right no substantial pleural effusion. Upper Abdomen: Liver parenchyma heterogeneous on noncontrast imaging. Prominent spleen is similar to prior but incompletely visualized. Musculoskeletal: No worrisome lytic or sclerotic osseous abnormality. IMPRESSION: 1. Clear progression of the left upper lobe pulmonary nodule, now measuring 1.6 cm in the same dimension that was previously measured at 1.2 cm. 2. Interval progression of patchy and nodular airspace  disease in the left lower lobe with associated volume loss. Imaging features likely related to infectious/inflammatory etiology. 3. Aortic Atherosclerosis (ICD10-I70.0). Electronically Signed   By: Misty Stanley M.D.   On: 02/02/2021 11:07   CT LUMBAR SPINE WO CONTRAST  Result Date: 02/02/2021 CLINICAL DATA:  70 year old male with lung cancer undergoing treatment. Increased falls. Right side low back pain. Altered mental status. EXAM: CT LUMBAR SPINE WITHOUT CONTRAST TECHNIQUE: Multidetector CT imaging of the lumbar spine was performed without intravenous contrast administration. Multiplanar CT image reconstructions were also generated. COMPARISON:  CT chest and pelvis today reported separately. Lumbar MRI 04/13/2015. CT Abdomen and Pelvis 02/16/2020. FINDINGS: Segmentation: Normal. Alignment: Stable lumbar lordosis since January. No spondylolisthesis. Vertebrae: Round lucent/lytic lesion of the L3 anterior superior vertebral body (coronal image 17 and series 4, image 55) has partially sclerotic margin and abuts the superior endplate, but there was no is new since January with no evidence of developing Schmorl's node at that time. The lesion is about 16 mm diameter. And background bone mineralization appears more heterogeneous since January. But there is no other discrete osseous lesion identified. Chronic sclerosis of the medial left iliac bone appears stable and benign. Paraspinal and other soft tissues: Aortoiliac calcified atherosclerosis. Left Common iliac artery vascular stent. Some oral contrast is present in bowel. Lumbar paraspinal soft  tissues remain within normal limits. Disc levels: T12-L1:  Negative. L1-L2:  Mild disc bulging.  No stenosis. L2-L3: Circumferential disc bulge appears progressed since 2017, with broad-based posterior component. However, there is no significant stenosis. L3-L4: Minor disc bulging appears increased since 2017. No stenosis. L4-L5: Mild disc bulging appears increased  since 2017. No stenosis. L5-S1:  Mild disc bulging appears stable since 2017.  No stenosis. IMPRESSION: 1. A 16 mm lucent lesion of the L3 vertebral body is new since January and more suspicious for osseous metastatic disease than a degenerative Schmorl's node. Lumbar MRI (without and with contrast preferred) may characterize further. 2. No lumbar pathologic fracture or other acute finding by CT. Mild for age lumbar spine degeneration. Electronically Signed   By: Genevie Ann M.D.   On: 02/02/2021 11:10   CT PELVIS WO CONTRAST  Result Date: 02/02/2021 CLINICAL DATA:  Recent falls. EXAM: CT PELVIS WITHOUT CONTRAST TECHNIQUE: Multidetector CT imaging of the pelvis was performed following the standard protocol without intravenous contrast. COMPARISON:  November 02, 2020. FINDINGS: Urinary Tract: Bladder is decompressed by a suprapubic catheter. No abnormality visualized. Bowel:  Unremarkable visualized pelvic bowel loops. Vascular/Lymphatic: No pathologically enlarged lymph nodes. Aortoiliac atherosclerotic vascular disease. Reproductive:  Unchanged mild prostatomegaly. Other:  Unchanged splenomegaly, partially visualized. Musculoskeletal: No acute or significant osseous findings. IMPRESSION: 1. No acute osseous abnormality. 2. Unchanged splenomegaly. 3. Aortic Atherosclerosis (ICD10-I70.0). Electronically Signed   By: Titus Dubin M.D.   On: 02/02/2021 11:06   MR BRAIN W WO CONTRAST  Result Date: 02/03/2021 CLINICAL DATA:  History of lung cancer and lymphoma, presents with altered mental status EXAM: MRI HEAD WITHOUT AND WITH CONTRAST TECHNIQUE: Multiplanar, multiecho pulse sequences of the brain and surrounding structures were obtained without and with intravenous contrast. CONTRAST:  7.53mL GADAVIST GADOBUTROL 1 MMOL/ML IV SOLN COMPARISON:  CT head 02/01/2021, MR head 09/29/2020 FINDINGS: The coronal T1 postcontrast images are markedly motion degraded. Brain: There is no evidence of acute intracranial  hemorrhage, extra-axial fluid collection, or acute infarct. There is mild global parenchymal volume loss with commensurate enlargement of the ventricular system. Patchy foci of FLAIR signal abnormality in the subcortical and periventricular white matter likely reflects sequela of mild chronic white matter microangiopathy. There is no mass lesion or abnormal enhancement, within the confines of motion degraded coronal T1 postcontrast images. There is no midline shift. Vascular: Normal flow voids. Skull and upper cervical spine: Normal marrow signal. Sinuses/Orbits: There is a mucous retention cyst in the left maxillary sinus. Globes and orbits are unremarkable. Other: None. IMPRESSION: No evidence of intracranial metastatic disease, though the coronal T1 postcontrast images are markedly motion degraded. Electronically Signed   By: Valetta Mole M.D.   On: 02/03/2021 20:59   MR LUMBAR SPINE WO CONTRAST  Result Date: 02/02/2021 CLINICAL DATA:  Lumbar radiculopathy, symptoms persist with > 6 wks treatment EXAM: MRI LUMBAR SPINE WITHOUT CONTRAST TECHNIQUE: Multiplanar, multisequence MR imaging of the lumbar spine was performed. No intravenous contrast was administered. COMPARISON:  CT of the lumbar spine 02/02/2021. MRI of the lumbar spine 04/12/2005. FINDINGS: The patient refused postcontrast imaging. Segmentation: Standard segmentation is assumed. Alignment:  Normal Vertebrae: Approximately 2 cm round lesion within the anterior/superior L3 vertebral body, which is T1 hypointense and T2/STIR intermediate in signal with surrounding STIR hyperintense edema. Approximately 10 mm L2 and 8 mm L1 lesions, which are T1 and T2 hypointense (series 3 and 4, image 9) without appreciable STIR hyperintensity. Conus medullaris and cauda equina: Conus extends to  the T2 level level. Conus appears normal. Paraspinal and other soft tissues: Unremarkable. Disc levels: T12-L1: No significant disc protrusion, foraminal stenosis, or canal  stenosis. L1-L2: No significant disc protrusion, foraminal stenosis, or canal stenosis. L2-L3: No significant disc protrusion, foraminal stenosis, or canal stenosis. L3-L4: Mild facet arthropathy and slight disc bulge without significant stenosis. L4-L5: Small broad disc bulge and mild bilateral facet arthropathy. No significant canal or foraminal stenosis. L5-S1: No significant disc protrusion, foraminal stenosis, or canal stenosis. IMPRESSION: 1. Approximately 2 cm round lesion within the anterior/superior L3 vertebral body, concerning for metastatic disease in this patient with known lung cancer. Surrounding marrow edema may be reactive or represent superimposed pathologic fracture. 2. Approximately 10 mm L2 and 8 mm L1 lesions are indeterminate. The patient refused contrast on this study. If the patient is willing, postcontrast imaging may be helpful to better evaluate. Alternatively, follow-up MRI could assess stability. 3. No significant canal or foraminal stenosis. Electronically Signed   By: Margaretha Sheffield M.D.   On: 02/02/2021 15:08   DG Chest Port 1 View  Result Date: 02/02/2021 CLINICAL DATA:  70 year old male with altered mental status and shortness of breath. History of lymphoma. And history of malignant cells on left upper lobe needle biopsy in October. EXAM: PORTABLE CHEST 1 VIEW COMPARISON:  Chest radiographs 02/01/2021 and earlier. FINDINGS: Portable AP semi upright view at 0740 hours. Stable right chest power port. Larger lung volumes. Normal cardiac size and mediastinal contours. Visualized tracheal air column is within normal limits. Left mid lung nodule with adjacent surgical clip appears grossly stable from October CT (please see that report). No superimposed pneumothorax, pulmonary edema, pleural effusion or new pulmonary opacity. No acute osseous abnormality identified. IMPRESSION: 1. Grossly stable left lung nodule corresponding to known non-small cell carcinoma. 2. Larger lung  volumes with no new cardiopulmonary abnormality. Electronically Signed   By: Genevie Ann M.D.   On: 02/02/2021 07:56

## 2021-02-04 NOTE — Progress Notes (Signed)
Pt found with tubing pulled apart, blood backed up in port dripping into the bed. Line flushed. Heparin instilled due to multiple times needle has been removed by patient. RN aware

## 2021-02-04 NOTE — Progress Notes (Signed)
Patient removed PAC needle after procedure. He removed PAC needle 2 days ago as well. Advised patient's nurse to put mitts both hands prevent keep removing PAC needle. HS [employer]

## 2021-02-04 NOTE — Telephone Encounter (Signed)
Called back to clarify message. Ryan Copeland is still in the hospital. Ryan Copeland will call back regarding appt once Ryan Copeland is d/ced home.

## 2021-02-04 NOTE — Progress Notes (Signed)
Received consult to re-access port. Pt dislodged needle for the second time today. Port re-accessed. RN reports she will apply mittens.

## 2021-02-04 NOTE — Progress Notes (Signed)
Occupational Therapy Treatment Patient Details Name: Ryan Copeland MRN: [id number] DOB: 02/12/1951 Today's Date: 02/04/2021   History of present illness Pt is 70 y/o M presenting with AMS. 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   OT comments  Patient seen by skilled OT to address bed mobility, transfers, and self care standing at sink. Patient was able to donn socks seated on EOB with supervision.  Patient asked to walk to sink without RW but appeared unsteady and was min guard to get to sink. Patient performed self care standing at sink with supervision and no LOB. Patient making good gains with OT treatment.  Acute OT to continue to follow.    Recommendations for follow up therapy are one component of a multi-disciplinary discharge planning process, led by the attending physician.  Recommendations may be updated based on patient status, additional functional criteria and insurance authorization.    Follow Up Recommendations  No OT follow up    Assistance Recommended at Discharge Set up Supervision/Assistance  Equipment Recommendations  None recommended by OT    Recommendations for Other Services      Precautions / Restrictions Precautions Precautions: Fall Restrictions Weight Bearing Restrictions: No       Mobility Bed Mobility Overal bed mobility: Modified Independent             General bed mobility comments: used rails to assist    Transfers Overall transfer level: Needs assistance Equipment used: Rolling walker (2 wheels) Transfers: Sit to/from Stand Sit to Stand: Min guard           General transfer comment: did not want to use RW but was willing with encouragement     Balance Overall balance assessment: Needs assistance Sitting-balance support: No upper extremity supported Sitting balance-Leahy Scale: Good     Standing balance  support: No upper extremity supported Standing balance-Leahy Scale: Fair Standing balance comment: able to stand at sink for self care tasks                           ADL either performed or assessed with clinical judgement   ADL Overall ADL's : Needs assistance/impaired     Grooming: Supervision/safety;Standing;Wash/dry hands;Wash/dry face;Oral care Grooming Details (indicate cue type and reason): stood at sink for grooming with supervision for safety Upper Body Bathing: Supervision/ safety;Standing Upper Body Bathing Details (indicate cue type and reason): performed standing at sink     Upper Body Dressing : Supervision/safety;Sitting Upper Body Dressing Details (indicate cue type and reason): changed gowns Lower Body Dressing: Supervision/safety;Sit to/from stand Lower Body Dressing Details (indicate cue type and reason): donned socks             Functional mobility during ADLs: Min guard General ADL Comments: able to perform most ADL tasks with supervision and increased time    Extremity/Trunk Assessment              Vision       Perception     Praxis      Cognition Arousal/Alertness: Awake/alert Behavior During Therapy: WFL for tasks assessed/performed Overall Cognitive Status: Impaired/Different from baseline Area of Impairment: Problem solving;Attention                   Current Attention Level: Selective         Problem Solving: Slow processing;Requires verbal cues General Comments: unable to  give correct date.  increased time to respond to commands          Exercises     Shoulder Instructions       General Comments      Pertinent Vitals/ Pain       Pain Assessment: No/denies pain  Home Living                                          Prior Functioning/Environment              Frequency  Min 2X/week        Progress Toward Goals  OT Goals(current goals can now be found in the care plan  section)  Progress towards OT goals: Progressing toward goals  Acute Rehab OT Goals Patient Stated Goal: get better OT Goal Formulation: With patient Time For Goal Achievement: 02/16/21 Potential to Achieve Goals: Good ADL Goals Pt Will Perform Grooming: with modified independence Pt Will Perform Lower Body Bathing: with modified independence Pt Will Perform Lower Body Dressing: with modified independence Pt Will Transfer to Toilet: with modified independence  Plan Discharge plan remains appropriate    Co-evaluation                 AM-PAC OT "6 Clicks" Daily Activity     Outcome Measure   Help from another person eating meals?: None Help from another person taking care of personal grooming?: A Little Help from another person toileting, which includes using toliet, bedpan, or urinal?: A Little Help from another person bathing (including washing, rinsing, drying)?: A Little Help from another person to put on and taking off regular upper body clothing?: A Little Help from another person to put on and taking off regular lower body clothing?: A Little 6 Click Score: 19    End of Session Equipment Utilized During Treatment: Gait belt;Rolling walker (2 wheels)  OT Visit Diagnosis: Unsteadiness on feet (R26.81)   Activity Tolerance Patient tolerated treatment well   Patient Left in chair;with call bell/phone within reach;with chair alarm set   Nurse Communication Mobility status        Time: [phone] OT Time Calculation (min): 28 min  Charges: OT General Charges $OT Visit: 1 Visit OT Treatments $Self Care/Home Management : 23-37 mins  Lodema Hong, [place]  Pager [phone] Office [place] 02/04/2021, 11:09 AM

## 2021-02-04 NOTE — Progress Notes (Signed)
Physical Therapy Treatment Patient Details Name: ROMELO Copeland MRN: [id number] DOB: 13-Oct-1950 Today's Date: 02/04/2021   History of Present Illness Pt is 70 y/o M presenting with AMS. 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    PT Comments    Pt still a little confused from this am.  Willing to mobilize.  Emphasis on sit to stands/safety, progression of gait stability/stamina and negotiation of steps/safety.    Recommendations for follow up therapy are one component of a multi-disciplinary discharge planning process, led by the attending physician.  Recommendations may be updated based on patient status, additional functional criteria and insurance authorization.  Follow Up Recommendations  No PT follow up     Assistance Recommended at Discharge Intermittent Supervision/Assistance  Equipment Recommendations  None recommended by PT    Recommendations for Other Services       Precautions / Restrictions Precautions Precautions: Fall     Mobility  Bed Mobility               General bed mobility comments: up in the chair on arrival    Transfers Overall transfer level: Needs assistance Equipment used: Rolling walker (2 wheels) Transfers: Sit to/from Stand Sit to Stand: Min guard           General transfer comment: used UE's appropriately for safety    Ambulation/Gait Ambulation/Gait assistance: Min guard Gait Distance (Feet): 180 Feet Assistive device: Rolling walker (2 wheels) Gait Pattern/deviations: Step-through pattern;Decreased stride length   Gait velocity interpretation: <1.8 ft/sec, indicate of risk for recurrent falls   General Gait Details: Pt was generally steady, some drift, slower   Stairs Stairs: Yes Stairs assistance: Min assist Stair Management: Two rails;One rail Right;Alternating pattern;Forwards Number of Stairs:  4 General stair comments: mildly unsteady from not responding quickly enough to commands to not go any further (limited by length of IV line), needed rails for safety and min assist for stability assist   Wheelchair Mobility    Modified Rankin (Stroke Patients Only)       Balance Overall balance assessment: Needs assistance   Sitting balance-Leahy Scale: Good     Standing balance support: No upper extremity supported Standing balance-Leahy Scale: Fair                              Cognition Arousal/Alertness: Awake/alert Behavior During Therapy: WFL for tasks assessed/performed Overall Cognitive Status: Impaired/Different from baseline Area of Impairment: Problem solving;Attention                   Current Attention Level: Selective         Problem Solving: Slow processing;Requires verbal cues General Comments: increased time to respond to commands,        Exercises      General Comments        Pertinent Vitals/Pain Pain Assessment: No/denies pain    Home Living                          Prior Function            PT Goals (current goals can now be found in the care plan section) Acute Rehab PT Goals PT Goal Formulation: With patient Time For Goal Achievement: 02/16/21 Potential to Achieve Goals: Good Progress towards PT goals: Progressing toward goals    Frequency  Min 3X/week      PT Plan Current plan remains appropriate    Co-evaluation              AM-PAC PT "6 Clicks" Mobility   Outcome Measure  Help needed turning from your back to your side while in a flat bed without using bedrails?: None Help needed moving from lying on your back to sitting on the side of a flat bed without using bedrails?: A Little Help needed moving to and from a bed to a chair (including a wheelchair)?: A Little Help needed standing up from a chair using your arms (e.g., wheelchair or bedside chair)?: A Little Help needed to  walk in hospital room?: A Little Help needed climbing 3-5 steps with a railing? : A Little 6 Click Score: 19    End of Session   Activity Tolerance: Patient tolerated treatment well Patient left: in chair;with call bell/phone within reach Nurse Communication: Mobility status PT Visit Diagnosis: Muscle weakness (generalized) (M62.81);Other abnormalities of gait and mobility (R26.89)     Time: [id number] PT Time Calculation (min) (ACUTE ONLY): 20 min  Charges:  $Gait Training: 8-22 mins                     02/04/2021  Ginger Carne., PT Acute Rehabilitation Services [phone]  (pager) [phone]  (office)   Tessie Fass Harutyun [place] 02/04/2021, 5:40 PM

## 2021-02-04 NOTE — Procedures (Signed)
Interventional Radiology Procedure Note  Procedure: CT biopsy of L3 lesion  Complications: None  Estimated Blood Loss: None  Recommendations: - Bedrest x 1 hr - Path sent    Signed,  Criselda Peaches, MD

## 2021-02-04 NOTE — Progress Notes (Signed)
I spoke with the patient's wife. We reviewed the CT scan of his chest as well. 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 We will follow up with his L3 biopsy results when available. We would offer palliative radiotherapy if biopsies confirm lung cancer. Dr. Lindi Adie has also discussed sending his prior path for molecular testing. Dr. Tammi Klippel will be covering for Dr. Ida Rogue patients next week as well so I will keep our team informed of our plans in our absence next week. She is in agreement and we will follow up when we return to clinic.

## 2021-02-04 NOTE — Progress Notes (Signed)
Patient pulled port Access needle and cardiac monitoring wires. Educate pt. Will put mittens on

## 2021-02-05 ENCOUNTER — Ambulatory Visit
Admission: RE | Admit: 2021-02-05 | Discharge: 2021-02-05 | Disposition: A | Discharge status: Home / Self Care | Payer: Medicare Other | Source: Ambulatory Visit | Attending: Radiation Oncology | Admitting: Radiation Oncology

## 2021-02-05 DIAGNOSIS — Z7189 Other specified counseling: Secondary | ICD-10-CM

## 2021-02-05 DIAGNOSIS — Z515 Encounter for palliative care: Secondary | ICD-10-CM

## 2021-02-05 DIAGNOSIS — M545 Low back pain, unspecified: Secondary | ICD-10-CM

## 2021-02-05 DIAGNOSIS — G9341 Metabolic encephalopathy: Secondary | ICD-10-CM

## 2021-02-05 LAB — CBC WITH DIFFERENTIAL/PLATELET
Abs Immature Granulocytes: 0.05 10*3/uL (ref 0.00–0.07)
Basophils Absolute: 0 10*3/uL (ref 0.0–0.1)
Basophils Relative: 0 %
Eosinophils Absolute: 0.1 10*3/uL (ref 0.0–0.5)
Eosinophils Relative: 3 %
HCT: 36.4 % — ABNORMAL LOW (ref 39.0–52.0)
Hemoglobin: 11.5 g/dL — ABNORMAL LOW (ref 13.0–17.0)
Immature Granulocytes: 2 %
Lymphocytes Relative: 19 %
Lymphs Abs: 0.5 10*3/uL — ABNORMAL LOW (ref 0.7–4.0)
MCH: 28.5 pg (ref 26.0–34.0)
MCHC: 31.6 g/dL (ref 30.0–36.0)
MCV: 90.1 fL (ref 80.0–100.0)
Monocytes Absolute: 0.4 10*3/uL (ref 0.1–1.0)
Monocytes Relative: 16 %
Neutro Abs: 1.6 10*3/uL — ABNORMAL LOW (ref 1.7–7.7)
Neutrophils Relative %: 60 %
Platelets: 49 10*3/uL — ABNORMAL LOW (ref 150–400)
RBC: 4.04 MIL/uL — ABNORMAL LOW (ref 4.22–5.81)
RDW: 16.7 % — ABNORMAL HIGH (ref 11.5–15.5)
WBC: 2.6 10*3/uL — ABNORMAL LOW (ref 4.0–10.5)
nRBC: 0 % (ref 0.0–0.2)

## 2021-02-05 LAB — COMPREHENSIVE METABOLIC PANEL
ALT: 18 U/L (ref 0–44)
AST: 20 U/L (ref 15–41)
Albumin: 3.2 g/dL — ABNORMAL LOW (ref 3.5–5.0)
Alkaline Phosphatase: 77 U/L (ref 38–126)
Anion gap: 10 (ref 5–15)
BUN: 14 mg/dL (ref 8–23)
CO2: 24 mmol/L (ref 22–32)
Calcium: 9 mg/dL (ref 8.9–10.3)
Chloride: 112 mmol/L — ABNORMAL HIGH (ref 98–111)
Creatinine, Ser: 1.01 mg/dL (ref 0.61–1.24)
GFR, Estimated: >60 mL/min (ref >60)
Glucose, Bld: 56 mg/dL — ABNORMAL LOW (ref 70–99)
Potassium: 3.9 mmol/L (ref 3.5–5.1)
Sodium: 146 mmol/L — ABNORMAL HIGH (ref 135–145)
Total Bilirubin: 1.1 mg/dL (ref 0.3–1.2)
Total Protein: 6.2 g/dL — ABNORMAL LOW (ref 6.5–8.1)

## 2021-02-05 LAB — PROCALCITONIN: Procalcitonin: <0.1 ng/mL

## 2021-02-05 LAB — MAGNESIUM: Magnesium: 1.8 mg/dL (ref 1.7–2.4)

## 2021-02-05 LAB — C-REACTIVE PROTEIN: CRP: 0.7 mg/dL (ref <1.0)

## 2021-02-05 LAB — BRAIN NATRIURETIC PEPTIDE: B Natriuretic Peptide: 405.2 pg/mL — ABNORMAL HIGH (ref 0.0–100.0)

## 2021-02-05 MED ORDER — HYDROMORPHONE HCL 1 MG/ML IJ SOLN: 0.5000 mg | INTRAMUSCULAR | Status: DC | PRN

## 2021-02-05 MED ORDER — DEXTROSE 5 % IV SOLN: INTRAVENOUS | Status: AC

## 2021-02-05 MED ORDER — HYDROCODONE-ACETAMINOPHEN 10-325 MG PO TABS
1.0000 | ORAL_TABLET | Freq: Four times a day (QID) | ORAL | Status: DC
  Administered 2021-02-05 – 2021-02-07 (×5): 1 via ORAL
  Filled 2021-02-05 (×7): qty 1

## 2021-02-05 MED ORDER — QUETIAPINE FUMARATE 25 MG PO TABS: 25.0000 mg | ORAL_TABLET | Freq: Every day | ORAL | Status: DC

## 2021-02-05 MED ORDER — DEXAMETHASONE SODIUM PHOSPHATE 4 MG/ML IJ SOLN
4.0000 mg | Freq: Once | INTRAMUSCULAR | Status: AC
  Administered 2021-02-06: 4 mg via INTRAVENOUS
  Filled 2021-02-05: qty 1

## 2021-02-05 MED ORDER — KETOROLAC TROMETHAMINE 30 MG/ML IJ SOLN
30.0000 mg | Freq: Once | INTRAMUSCULAR | Status: AC
  Administered 2021-02-05: 30 mg via INTRAVENOUS
  Filled 2021-02-05: qty 1

## 2021-02-05 MED ORDER — ZOLPIDEM TARTRATE 5 MG PO TABS: 10.0000 mg | ORAL_TABLET | Freq: Every evening | ORAL | Status: DC | PRN

## 2021-02-05 MED ORDER — HEPARIN SOD (PORK) LOCK FLUSH 100 UNIT/ML IV SOLN
500.0000 [IU] | INTRAVENOUS | Status: DC | PRN
  Administered 2021-02-05 (×2): 500 [IU]
  Filled 2021-02-05 (×3): qty 5

## 2021-02-05 MED ORDER — QUETIAPINE FUMARATE 25 MG PO TABS
25.0000 mg | ORAL_TABLET | Freq: Two times a day (BID) | ORAL | Status: DC
  Administered 2021-02-05: 25 mg via ORAL
  Filled 2021-02-05: qty 1

## 2021-02-05 MED ORDER — ALPRAZOLAM 0.5 MG PO TABS
0.5000 mg | ORAL_TABLET | Freq: Two times a day (BID) | ORAL | Status: DC
  Administered 2021-02-05 – 2021-02-07 (×3): 0.5 mg via ORAL
  Filled 2021-02-05 (×4): qty 1

## 2021-02-05 MED ORDER — HALOPERIDOL LACTATE 5 MG/ML IJ SOLN
1.0000 mg | Freq: Four times a day (QID) | INTRAMUSCULAR | Status: DC | PRN
  Filled 2021-02-05: qty 1

## 2021-02-05 MED ORDER — HEPARIN SOD (PORK) LOCK FLUSH 100 UNIT/ML IV SOLN
500.0000 [IU] | INTRAVENOUS | Status: DC
  Filled 2021-02-05: qty 5

## 2021-02-05 MED ORDER — ZOLPIDEM TARTRATE 5 MG PO TABS
5.0000 mg | ORAL_TABLET | Freq: Every evening | ORAL | Status: DC | PRN
  Filled 2021-02-05: qty 1

## 2021-02-05 MED ORDER — CELECOXIB 200 MG PO CAPS
200.0000 mg | ORAL_CAPSULE | Freq: Two times a day (BID) | ORAL | Status: DC
  Administered 2021-02-06 – 2021-02-08 (×3): 200 mg via ORAL
  Filled 2021-02-05 (×6): qty 1

## 2021-02-05 NOTE — Plan of Care (Signed)
°  Problem: Clinical Measurements: Goal: Ability to maintain clinical measurements within normal limits will improve Outcome: Progressing   Problem: Activity: Goal: Risk for activity intolerance will decrease Outcome: Progressing   Problem: Safety: Goal: Ability to remain free from injury will improve Outcome: Not Progressing

## 2021-02-05 NOTE — Progress Notes (Signed)
HEMATOLOGY-ONCOLOGY PROGRESS NOTE  SUBJECTIVE: Patient was sitting in the chair but appears to be slightly confused and disoriented.  He started getting up and moving around but was able to settle down again.  When asked if he can conference his wife he was able to use his phone to call her. We discussed extensively about the original diagnosis and the CT scan findings from October and how they compared to the current CT scans.  Previously the diagnosis was stage I lung cancer and currently patient has several nodules in the lung as well as bone lesion which was biopsied yesterday.  He appears to have tolerated the biopsy extremely well.  It is unclear what his baseline mental status was but he was able to answer questions appropriately but there appears to be subtle underlying confusion/disorientation.  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  No evidence of aortic dissection or other acute process in the chest. 2.  Centrilobular emphysema with a 5 mm right lung nodule. Given the concurrent centrilobular emphysema, follow-up chest CT at 6 -12 months is recommended.  3.  Coronary artery atherosclerosis which is age advanced. 4.  Prominent thoracic lymph nodes.  These can be reevaluated at follow-up.   05/06/2010 Imaging   1.  Multiple small periaortic lymph nodes consistent with the patient's history of the chronic lymphocytic leukemia. 2.  No evidence of solid organ involvement   11/03/2011 Imaging   1.  Interval progression of abdominal and pelvic adenopathy. 2.  Progression of splenomegaly.  The spleen now measures 23 cm in length   11/18/2011 Bone Marrow Biopsy   Bone Marrow, Aspirate,Biopsy, and Clot, right iliac bone - HYPERCELLULAR BONE  MARROW WITH EXTENSIVE INVOLVEMENT BY CHRONIC LYMPHOCYTIC LEUKEMIA. PERIPHERAL BLOOD: - CHRONIC LYMPHOCYTIC LEUKEMIA   03/08/2012 Imaging   1.  Progressive increase in retroperitoneal, iliac, and inguinal lymphadenopathy. 2.  Interval increase in massive splenomegaly.     06/01/2012 Procedure   Placement of single lumen port a cath via right internal jugular vein.  The catheter tip lies at the cavoatrial junction.  A power injectable port a cath was placed and is ready for immediate use   06/20/2012 - 11/23/2012 Chemotherapy   He received FCR x 6 cycles   12/19/2012 Imaging   Left common iliac stent. Abdominal vasculature remains patent. Improving supraclavicular and axillary lymphadenopathy. Residual right subpectoral nodes measure up to 10 mm short axis. Improving retroperitoneal lymphadenopathy, measuring up to 16 mm short axis. Improving splenomegaly, measuring 18.7 cm.     01/20/2016 Imaging   1. Stable exam.  No new or progressive findings. 2. No CT findings to explain odynophagia   09/02/2016 Pathology Results   The findings are consistent with involvement by previously known chronic lymphocytic leukemia   09/02/2016 Pathology Results   FISH for CLL came back positive for deletion 13q   09/15/2016 Imaging   1. Borderline enlarged abdominal and pelvic lymph nodes. Compared with 11/03/2011 these are decreased in size as detailed above. 2. Persistent splenomegaly. 3. Aortic Atherosclerosis (ICD10-I70.0). LAD coronary artery calcification noted.   10/06/2016 - 02/24/2017 Chemotherapy   He received Bendamustine and Rituxan   11/03/2016 Adverse Reaction   Dose of Bendamustine is reduced due to severe pancytopenia   12/28/2016 Imaging   1.  Borderline enlarged abdominal peritoneal ligament and abdominal retroperitoneal lymph nodes, stable. 2. Splenomegaly. 3.  Aortic atherosclerosis (ICD10-170.0).   03/22/2017 PET scan   1. No hypermetabolic adenopathy identified within the neck, chest,  abdomen or pelvis. 2. Prominent left retroperitoneal node measures 1.6 cm without significant FDG uptake. 3. Splenomegaly. 4. Aortic Atherosclerosis (ICD10-I70.0) and Emphysema (ICD10-J43.9). LAD and left circumflex atherosclerotic calcifications noted.   02/02/2018 Procedure   IMPRESSION: Widely patent right IJ power port catheter.   04/06/2018 Imaging   1. Interval enlargement of axillary, mediastinal, and retroperitoneal lymph nodes, as well as increased splenomegaly, findings concerning for progression of CLL in comparison to prior PET-CT dated 03/22/2017.   2.  Other chronic and incidental findings as detailed above.     04/16/2018 -  Chemotherapy   The patient had acalabrutinib for chemo   04/17/2018 - 04/21/2018 Hospital Admission   He was hospitalized for influenza   11/29/2018 Imaging   1. Interval response to therapy. No thoracic added not scratch set no thoracic or pelvic adenopathy identified. Significant improvement in abdominal adenopathy. Largest remaining lymph node measures 1.3cm in the left retroperitoneal region. Previously 2.5 cm. 2. Persistent splenomegaly, improved from previous exam. 3. No new or progressive disease identified within the chest, abdomen or pelvis. 4. Stable appearance of 5 mm right middle lobe lung nodule. 5. Aortic Atherosclerosis (ICD10-I70.0) and Emphysema (ICD10-J43.9). Coronary artery calcifications.     09/17/2020 Imaging   CT neck  Negative for mass or adenopathy in the neck. No evidence of recurrent CLL   09/17/2020 Imaging   1. Interval enlargement of a perihilar nodule anteriorly in the left upper lobe, measuring 2.1 x 1.2 cm, previously 1.4 x 0.9 cm when measured similarly. This is highly concerning for a slowly enlarging primary lung malignancy. Recommend multidisciplinary thoracic referral for consideration of metabolic characterization by PET-CT and tissue sampling. 2. There is a new nodule of the anterior left upper lobe measuring 1.0 cm,  nonspecific although worrisome for satellite nodule or metastasis. 3. Unchanged enlargement of a left hilar lymph node, concerning for nodal metastasis. 4. Unchanged consolidation and nodularity of the infrahilar left lower lobe as well as of the perihilar right middle lobe, these findings generally nonspecific and infectious or inflammatory, appearance particularly suggesting atypical mycobacterial infection.  5. Redemonstrated enlarged right axillary and retroperitoneal lymph nodes, stable to slightly diminished in size. These are in keeping with reported diagnosis of CLL. 6. Slightly improved splenomegaly, maximum coronal span 18.0 cm.  7. Emphysema. 8. Coronary artery disease.   Aortic Atherosclerosis (ICD10-I70.0) and Emphysema (ICD10-J43.9).   Non-small cell carcinoma of lung, left ([place])  09/18/2020 Initial Diagnosis   Non-small cell carcinoma of lung, left ([place])   09/20/2020 Cancer Staging   Staging form: Lung, AJCC 8th Edition - Clinical stage from 09/20/2020: Stage IVA (cT3, cN1, cM1a) - Signed by [name], MD on 09/20/2020 Stage prefix: Initial diagnosis    09/30/2020 Imaging   IMPRESSION MRI brain No evidence of metastatic disease in the brain.   10/05/2020 PET scan   Hypermetabolic 1 cm nodule in the LEFT upper lobe suspicious for pulmonary neoplasm. Referral to multi disciplinary thoracic oncologic setting is suggested if not yet performed. Biopsy may be warranted.   Juxta hilar nodule that shows enlargement over time and associated LEFT hilar lymph node without pronounced FDG uptake but given enlargement and uptake above blood pool raising the question of indolent neoplasm in this location.   Stable small pulmonary nodule in the RIGHT middle lobe  and stable RIGHT upper lobe subsolid nodule, attention on follow-up.   RIGHT axillary and retroperitoneal nodal enlargement with mildly elevated FDG uptake near mediastinal blood pool with respect to retroperitoneal lymph nodes, likely  related to the patient's known CLL. Splenic size remains smaller than in 2022. Close attention on follow-up is suggested.   Aortic Atherosclerosis (ICD10-I70.0).   Emphysema (ICD10-J43.9).   12/08/2020 Pathology Results   FINAL MICROSCOPIC DIAGNOSIS:   A. LUNG, LUL, BRUSHING:  - Malignant cells consistent with non-small cell carcinoma, see comment   B. LUNG, LUL, NEEDLE  BIOPSIES:  - Malignant cells consistent with non-small cell carcinoma   01/12/2021 - 01/19/2021 Radiation Therapy   Radiation treatment dates:   01/12/21-01/19/21   Site/dose:   The tumor in the LUL was treated with a course of stereotactic body radiation treatment. The patient received 54 Gy In 3 fractions at 18 G per fraction     OBJECTIVE: REVIEW OF SYSTEMS:   Chronic back pain   PHYSICAL EXAMINATION: ECOG PERFORMANCE STATUS: 2 - Symptomatic, <50% confined to bed  Vitals:   02/04/21 2115 02/05/21 0524  BP: (!) 125/58 110/64  Pulse: 91 (!) 53  Resp: 18 18  Temp: 98.1 F (36.7 C)   SpO2: 97% 97%   There were no vitals filed for this visit.     LABORATORY DATA:  I have reviewed the data as listed CMP Latest Ref Rng & Units 02/05/2021 02/04/2021 02/03/2021  Glucose 70 - 99 mg/dL 56(L) 93 113(H)  BUN 8 - 23 mg/dL _0 Creatinine 0.61 - 1.24 mg/dL 1.01 0.85 0.82  Sodium 135 - 145 mmol/L 146(H) 138 139  Potassium 3.5 - 5.1 mmol/L 3.9 3.3(L) 3.7  Chloride 98 - 111 mmol/L 112(H) 108 107  CO2 22 - 32 mmol/L _1 Calcium 8.9 - 10.3 mg/dL 9.0 8.5(L) 8.8(L)  Total Protein 6.5 - 8.1 g/dL 6.2(L) 5.5(L) 5.4(L)  Total Bilirubin 0.3 - 1.2 mg/dL 1.1 0.8 0.6  Alkaline Phos 38 - 126 U/L 77 66 71  AST 15 - 41 U/L 20 15 13(L)  ALT 0 - 44 U/L _2 Lab Results  Component Value Date   WBC 2.6 (L) 02/05/2021   HGB 11.5 (L) 02/05/2021   HCT 36.4 (L) 02/05/2021   MCV 90.1 02/05/2021   PLT 49 (L) 02/05/2021   NEUTROABS 1.6 (L) 02/05/2021    ASSESSMENT AND PLAN: 1.  Suspicion for metastatic  carcinoma: I discussed with the patient's wife that presence of cancer in the bone would indicate a metastatic carcinoma.  Lung would be the primary suspect since this was a lung cancer to begin with.  However pathology needs to confirm these results.  We also discussed treatment options for metastatic lung cancer could vary between immunotherapy or targeted therapy depending on molecular testing. I reviewed the entire scan reports from October and compared them to December.  The lung nodules have definitely gotten worse and with the presence of the lesion on the spine, we would consider this as metastatic disease. 2. back pain: We will need to discuss with radiation oncology if palliative radiation would help. 3.  delirium: Uncertain if this is medication induced versus underlying CNS abnormality (patient's wife brought up a finding from [place] where he had a lumbar puncture and supposedly abnormal cells were detected.)  Brain MRI did not reveal any abnormalities  10/14/2020: CSF Immunophenotyping:  - CD5-positive B-cell clone detected.  Note: The specimen is adequate for  evaluation.  Flow cytometry shows monoclonal B-cells (31% of total cells) with coexpression of CD5 and CD23, similar to that seen on peripheral blood.  While the concordant cytology specimen does not show significant red blood cells, the current findings may represent either CSF involvement by the patient's known CLL, or peripheral blood contamination of the sample.    4.  CLL/SLL: I discussed with the patient that the lymphadenopathy is most likely related to SLL.     From oncology standpoint we will have to wait the results of the pathology before discussing any treatment options. 5.  Patient's wife is extremely concerned that if he comes back mentally confused that she would not be able to assist him and that he would have to be brought right back to the hospital.

## 2021-02-05 NOTE — Progress Notes (Addendum)
PROGRESS NOTE                                                                                                                                                                                                             Patient Demographics:    Ryan Copeland, is a 70 y.o. male, DOB - 1951-01-29, OEU:[id number]  Outpatient Primary MD for the patient is [name]., PA-C    LOS - 3  Admit date - 02/01/2021    Chief Complaint  Patient presents with   Altered Mental Status       Brief Narrative (HPI from H&P)   Ryan Copeland is a 70 y.o. 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   Subjective:   Patient in bed appears to be slightly confused denies any headache chest or abdominal pain, still has having. + Lower Back pain.  New weakness or shortness of breath.   Assessment  & Plan :     Toxic encephalopathy in a patient with non-small cell cancer of the lung with now possible L3 metastatic lesion, chronic indwelling Foley catheter  - he is currently headache free with no neck stiffness or photophobia, head CT unremarkable.  Most likely source of his encephalopathy is ongoing lower back pain with lack of good quality sleep and being on narcotics for the back pain.  MRI brain nonacute no focal deficits.  Continue supportive care minimize narcotic use as much as needed, twice daily Seroquel to see if it helps with his agitation and encephalopathy.  Wife very apprehensive with narcotic use as she thinks it is causing worsening of his confusion.  Ongoing lower back pain for the last 1 week.  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  Pain control is difficult as above due to narcotics causing worsening of encephalopathy and tramadol not able  to hold his pain, most likely radiation treatments will be started this admission may require transferring to [facility] for that.  Non-small cell left lung cancer now possible mets to the L-spine ? Mild meningeal spread.  Recently completed radiation treatment.  Under the care of Dr. Valeta Harms pulmonary and Dr. Lisbeth Renshaw radiation oncology, CT scan noted, biopsy results prelim suggestive of metastatic lesion to the L-spine.  Discussed with Dr. Valeta Harms, will follow in the office.  Prognosis does not appear excellent.  Small cell B-cell lymphoma (CLL) - under the care of Dr. Alvy Bimler Case discussed with her, hold [place]ence in the acute setting, defer management to oncology.  Questionable pneumonia on admission.  Ruled out clinically.  I do not think he has active pneumonia have titrated off antibiotics, cultures have remained negative.  Hypogammaglobinemia requiring monthly IVIG treatment.  Supportive care.  Chronic indwelling suprapubic catheter.  Catheter changed on 02/01/2021.  Monitor.  UA appears stable.  History of gout.  Continue acyclovir.  Dyslipidemia.  On statin.   History of CAD with LAD stent.  Aortic root dilation.  Blood pressure low hence no beta-blocker, severe baseline thrombocytopenia hence no aspirin.  Continue statin for supportive care with outpatient PCP and cardiology follow-up  Hypokalemia.  Replaced.    Chronic moderate to severe thrombocytopenia.  Monitor.  Lab Results  Component Value Date   PLT 49 (L) 02/05/2021         Condition - Extremely  Guarded  Family Communication  : Wife [name] [phone] on 02/02/2021, 02/03/2021 x 2, 02/04/21, called wife twice on 02/05/2021/left at 10:45 AM, called again at 22 AM no response.  Code Status :  DNR  Consults  :  Oncology, radiation oncology, IR, pulmonary over the phone  PUD Prophylaxis :  Pepcid   Procedures  :     L 3 Biopsy 02/04/21  MRI Brain - non acute  CT Chest - 1. Clear progression of the left upper lobe pulmonary nodule, now measuring 1.6 cm in the same dimension that was previously measured at 1.2 cm. 2. Interval progression of patchy and nodular airspace disease in the left lower lobe with associated volume loss. Imaging features likely related to infectious/inflammatory etiology. 3. Aortic Atherosclerosis  MRI L Spine -  1. Approximately 2 cm round lesion within the anterior/superior L3 vertebral body, concerning for metastatic disease in this patient with known lung cancer. Surrounding marrow edema may be reactive or represent superimposed pathologic fracture. 2. Approximately 10 mm L2 and 8 mm L1 lesions are indeterminate. The patient refused contrast on this study. If the patient is willing, postcontrast imaging may be helpful to better evaluate. Alternatively, follow-up MRI could assess stability. 3. No significant canal or foraminal stenosis.      Disposition Plan  :    Status is: Inpt  DVT Prophylaxis  :     SCDs Start: 02/01/21 2047   Lab Results  Component Value Date   PLT 49 (L) 02/05/2021    Diet :  Diet Order             Diet regular Room service appropriate? Yes; Fluid consistency: Thin  Diet effective now                    Inpatient Medications  Scheduled Meds:  acyclovir  400 mg Oral BID   allopurinol  300 mg Oral Daily   benzonatate  100 mg Oral TID   Chlorhexidine Gluconate Cloth  6 each Topical Daily  famotidine  20 mg Oral BID   heparin lock flush  500 Units Intracatheter Q30 days   midodrine  15 mg Oral TID WC    montelukast  10 mg Oral Daily   QUEtiapine  25 mg Oral BID   rosuvastatin  5 mg Oral Daily   Continuous Infusions:  ceFEPime (MAXIPIME) IV 2 g (02/05/21 0640)   dextrose 100 mL/hr at 02/05/21 0933   PRN Meds:.acetaminophen, diphenoxylate-atropine, haloperidol lactate, heparin lock flush **AND** heparin lock flush, oxybutynin, sodium chloride flush, traMADol  Antibiotics  :    Anti-infectives (From admission, onward)    Start     Dose/Rate Route Frequency Ordered Stop   02/02/21 1000  vancomycin (VANCOCIN) IVPB 1000 mg/200 mL premix  Status:  Discontinued        1,000 mg 200 mL/hr over 60 Minutes Intravenous Every 12 hours 02/02/21 0008 02/04/21 0720   02/02/21 0200  ceFEPIme (MAXIPIME) 2 g in sodium chloride 0.9 % 100 mL IVPB        2 g 200 mL/hr over 30 Minutes Intravenous Every 8 hours 02/02/21 0008     02/01/21 2200  acyclovir (ZOVIRAX) tablet 400 mg        400 mg Oral 2 times daily 02/01/21 2108     02/01/21 1745  vancomycin (VANCOREADY) IVPB 1750 mg/350 mL        1,750 mg 175 mL/hr over 120 Minutes Intravenous  Once 02/01/21 1733 02/01/21 2255   02/01/21 1745  piperacillin-tazobactam (ZOSYN) IVPB 3.375 g        3.375 g 12.5 mL/hr over 240 Minutes Intravenous  Once 02/01/21 1733 02/01/21 2255        Time Spent in minutes  30   Lala Lund M.D on 02/05/2021 at 10:59 AM  To page go to [email or web address]   [facility] -  Office  [phone]  See all Orders from today for further details    Objective:   Vitals:   02/04/21 1225 02/04/21 1719 02/04/21 2115 02/05/21 0524  BP: (!) 147/74 123/62 (!) 125/58 110/64  Pulse: (!) 54 (!) 43 91 (!) 53  Resp: 18 18 18 18   Temp: (!) 97.4 F (36.3 C) 98.3 F (36.8 C) 98.1 F (36.7 C)   TempSrc: Oral Oral Oral   SpO2: 95% 98% 97% 97%    Wt Readings from Last 3 Encounters:  01/29/21 78.4 kg  01/13/21 76.8 kg  01/04/21 76.5 kg     Intake/Output Summary (Last 24 hours) at 02/05/2021 1059 Last data filed at  02/05/2021 0748 Gross per 24 hour  Intake 250 ml  Output 1400 ml  Net -1150 ml     Physical Exam  Awake Alert x1 , No new F.N deficits, slightly more confused today [place].AT,PERRAL Supple Neck, No JVD,   Symmetrical Chest wall movement, Good air movement bilaterally, CTAB RRR,No Gallops, Rubs or new Murmurs,  +ve B.Sounds, Abd Soft, No tenderness,   No Cyanosis, Clubbing or edema, suprapubic catheter      Data Review:    CBC Recent Labs  Lab 02/01/21 1617 02/02/21 0427 02/03/21 0439 02/04/21 0252 02/05/21 0507  WBC 9.7 10.1 3.9* 2.8* 2.6*  HGB 12.0* 11.6* 10.9* 11.2* 11.5*  HCT 38.4* 36.3* 34.3* 34.8* 36.4*  PLT 66* 54* 55* 48* 49*  MCV 89.7 89.9 89.3 87.4 90.1  MCH 28.0 28.7 28.4 28.1 28.5  MCHC 31.3 32.0 31.8 32.2 31.6  RDW 17.1* 17.2* 17.0* 16.6* 16.7*  LYMPHSABS 6.7*  --  1.8 0.7 0.5*  MONOABS 0.6  --  0.4 0.4 0.4  EOSABS 0.0  --  0.0 0.1 0.1  BASOSABS 0.0  --  0.0 0.0 0.0    Electrolytes Recent Labs  Lab 02/01/21 1617 02/01/21 1618 02/02/21 0427 02/02/21 2137 02/03/21 0439 02/04/21 0252 02/05/21 0507  NA 134*  --  136  --  139 138 146*  K 4.1  --  5.7*  --  3.7 3.3* 3.9  CL 100  --  105  --  107 108 112*  CO2 26  --  25  --  27 25 24   GLUCOSE 115*  --  80  --  113* 93 56*  BUN 14  --  14  --  16 13 14   CREATININE 0.98  --  0.87  --  0.82 0.85 1.01  CALCIUM 8.9  --  8.5*  --  8.8* 8.5* 9.0  AST 17  --   --   --  13* 15 20  ALT 25  --   --   --  17 16 18   ALKPHOS 86  --   --   --  71 66 77  BILITOT 0.3  --   --   --  0.6 0.8 1.1  ALBUMIN 3.6  --   --   --  2.7* 2.8* 3.2*  MG  --   --   --  2.0 1.7 1.8 1.8  CRP  --   --   --  3.7* 2.5* 0.8 0.7  PROCALCITON  --   --   --  <0.10 43.03 <0.10 <0.10  LATICACIDVEN  --  1.2  --   --   --   --   --   AMMONIA 14  --   --   --   --   --   --   BNP  --   --   --  96.8 114.3* 452.7* 405.2*     ------------------------------------------------------------------------------------------------------------------ No results for input(s): CHOL, HDL, LDLCALC, TRIG, CHOLHDL, LDLDIRECT in the last 72 hours.  Lab Results  Component Value Date   HGBA1C 4.6 (L) 11/02/2020    No results for input(s): TSH, T4TOTAL, T3FREE, THYROIDAB in the last 72 hours.  Invalid input(s): FREET3 ------------------------------------------------------------------------------------------------------------------ ID Labs Recent Labs  Lab 02/01/21 1617 02/01/21 1618 02/02/21 0427 02/02/21 2137 02/03/21 0439 02/04/21 0252 02/05/21 0507  WBC 9.7  --  10.1  --  3.9* 2.8* 2.6*  PLT 66*  --  54*  --  55* 48* 49*  CRP  --   --   --  3.7* 2.5* 0.8 0.7  PROCALCITON  --   --   --  <0.10 43.03 <0.10 <0.10  LATICACIDVEN  --  1.2  --   --   --   --   --   CREATININE 0.98  --  0.87  --  0.82 0.85 1.01   Cardiac Enzymes No results for input(s): CKMB, TROPONINI, MYOGLOBIN in the last 168 hours.  Invalid input(s): CK  Radiology Reports DG Chest 2 View  Result Date: 02/01/2021 CLINICAL DATA:  Altered mental status. EXAM: CHEST - 2 VIEW COMPARISON:  Chest x-ray 12/08/2020. FINDINGS: Right chest port catheter tip projects over the distal SVC. The heart is mildly enlarged, unchanged. There is some minimal interstitial and patchy opacities in both lung bases, left greater than right. There is no pleural effusion or pneumothorax. There is a stable calcified nodule in the right mid lung. No acute fractures are identified. IMPRESSION: 1. Cardiomegaly with  mild interstitial edema. Infection is not excluded in the left lung base. Electronically Signed   By: Ronney Asters M.D.   On: 02/01/2021 17:46   CT Head Wo Contrast  Result Date: 02/01/2021 CLINICAL DATA:  Mental status change, unknown cause EXAM: CT HEAD WITHOUT CONTRAST TECHNIQUE: Contiguous axial images were obtained from the base of the skull through the vertex  without intravenous contrast. COMPARISON:  CT head 11/02/2020 BRAIN: BRAIN Patchy and confluent areas of decreased attenuation are noted throughout the deep and periventricular white matter of the cerebral hemispheres bilaterally, compatible with chronic microvascular ischemic disease. No evidence of large-territorial acute infarction. No parenchymal hemorrhage. No mass lesion. No extra-axial collection. No mass effect or midline shift. No hydrocephalus. Basilar cisterns are patent. Vascular: No hyperdense vessel. Atherosclerotic calcifications are present within the cavernous internal carotid arteries. Skull: No acute fracture or focal lesion. Sinuses/Orbits: Paranasal sinuses and mastoid air cells are clear. The orbits are unremarkable. Other: None. IMPRESSION: No acute intracranial abnormality. Electronically Signed   By: Iven Finn M.D.   On: 02/01/2021 18:56   CT CHEST WO CONTRAST  Result Date: 02/02/2021 CLINICAL DATA:  Pneumonia. EXAM: CT CHEST WITHOUT CONTRAST TECHNIQUE: Multidetector CT imaging of the chest was performed following the standard protocol without IV contrast. COMPARISON:  chest x-ray earlier same day.  Chest CT 12/02/2020 FINDINGS: Cardiovascular: Heart size upper normal. Coronary artery calcification is evident. Mild atherosclerotic calcification is noted in the wall of the thoracic aorta. Right Port-A-Cath tip is positioned in the distal SVC. Mediastinum/Nodes: Stable upper normal subcarinal lymph node. No evidence for gross hilar lymphadenopathy although assessment is limited by the lack of intravenous contrast on the current study. The esophagus has normal imaging features. There is no axillary lymphadenopathy. Similar appearance of subpectoral lymph nodes bilaterally. Lungs/Pleura: Clear progression of the left upper lobe pulmonary nodule now measuring 1.6 cm in the same dimension measured previously at 1.2 cm. Patchy and nodular airspace disease in the left lower lobe is  progressive in the interval with associated volume loss. Fine detail is obscured by breathing motion. Dependent collapse/consolidative opacity is seen in both lungs, left greater than right no substantial pleural effusion. Upper Abdomen: Liver parenchyma heterogeneous on noncontrast imaging. Prominent spleen is similar to prior but incompletely visualized. Musculoskeletal: No worrisome lytic or sclerotic osseous abnormality. IMPRESSION: 1. Clear progression of the left upper lobe pulmonary nodule, now measuring 1.6 cm in the same dimension that was previously measured at 1.2 cm. 2. Interval progression of patchy and nodular airspace disease in the left lower lobe with associated volume loss. Imaging features likely related to infectious/inflammatory etiology. 3. Aortic Atherosclerosis (ICD10-I70.0). Electronically Signed   By: Misty Stanley M.D.   On: 02/02/2021 11:07   CT CHEST W CONTRAST  Result Date: 02/04/2021 CLINICAL DATA:  Inpatient. Non-small cell lung cancers status post SBRT in December. Restaging. EXAM: CT CHEST WITH CONTRAST TECHNIQUE: Multidetector CT imaging of the chest was performed during intravenous contrast administration. CONTRAST:  30mL OMNIPAQUE IOHEXOL 350 MG/ML SOLN COMPARISON:  02/02/2021 and 12/02/2020 chest CT. FINDINGS: Cardiovascular: Top-normal heart size. No significant pericardial effusion/thickening. Three-vessel coronary atherosclerosis. Right internal jugular Port-A-Cath terminates at the cavoatrial junction. Atherosclerotic nonaneurysmal thoracic aorta. Top-normal caliber main pulmonary artery (3.2 cm diameter). No central pulmonary emboli. Mediastinum/Nodes: No discrete thyroid nodules. Unremarkable esophagus. No axillary adenopathy. Enlarged 1.3 cm subcarinal node (series 3/image 84), newly enlarged since 12/02/2020, unchanged from 02/02/2021. Enlarged 1.5 cm left hilar node (series 3/image 84), unchanged from 02/02/2021, unchanged from  12/02/2020. No right hilar  adenopathy. Lungs/Pleura: No pneumothorax. Trace to left pleural effusion. No right pleural effusion. Mild centrilobular and paraseptal emphysema with mild diffuse bronchial wall thickening. Spiculated solid 1.9 x 1.7 cm anterior left upper lobe pulmonary nodule (series 7/image 88), increased from 1.4 x 1.2 cm on 12/02/2020 CT, unchanged from 02/02/2021 CT. Irregular solid medial central left upper lobe 2.4 x 1.7 cm nodule (series 7/image 81), mildly increased from 2.2 x 1.8 cm on 12/02/2020, unchanged from 02/02/2021. Solid 0.8 cm superior segment left lower lobe pulmonary nodule (series 7/image 76), increased from 0.4 cm on 12/02/2020, unchanged from 02/02/2021. Anterior left lower lobe 1.0 cm solid pulmonary nodule (series 7/image 125), increased from 0.4 cm on 12/02/2020, unchanged from 02/02/2021. Patchy consolidation in the anterior and dependent basilar left lower lobe is minimally improved from 02/02/2021 and largely new/in a different configuration from 12/02/2020. Several additional smaller solid pulmonary nodules scattered in both lungs are not substantially changed from 12/02/2020 or 02/02/2021, for example measuring 1.0 cm in the medial right middle lobe (series 7/image 106) and 0.6 cm in the posterior left upper lobe (series 7/image 81). Upper abdomen: Cholecystectomy. Partially visualized retroperitoneal lymphadenopathy involving the left para-aortic and aortocaval chains, not substantially changed since 11/02/2020 CT abdomen/pelvis study, for example measuring 2.0 cm in the left periaortic chain (series 3/image 189) and 1.6 cm in the aortocaval chain (series 3/image 187). Partially visualized splenomegaly, unchanged. Musculoskeletal: No aggressive appearing focal osseous lesions. Mild thoracic spondylosis. IMPRESSION: 1. Spiculated solid 1.9 cm anterior left upper lobe pulmonary nodule, increased from 1.4 cm on 12/02/2020 CT, unchanged from 02/02/2021 CT, suspicious for primary bronchogenic  carcinoma. 2. Medial central left upper lobe irregular solid 2.4 cm nodule, increased from 2.2 cm on 12/02/2020 CT, unchanged from 02/02/2021 CT. Left lower lobe 1.0 cm and 0.8 cm solid pulmonary nodules have both increased since 12/02/2020 CT, unchanged from 02/02/2021 CT. These findings are suspicious for metastatic disease and/or synchronous primary bronchogenic carcinomas. 3. Mild subcarinal lymphadenopathy is new since 12/02/2020. Left hilar adenopathy is stable. Partially visualized retroperitoneal lymphadenopathy in the upper abdomen, not substantially changed since 11/02/2020 CT abdomen/pelvis study. Partially visualized splenomegaly. These findings are uncertain for mildly progressive lymphoproliferative condition versus metastatic disease. 4. Trace left pleural effusion. 5. Patchy consolidation in the anterior and dependent basilar left lower lobe is minimally improved from 02/02/2021 and largely new/in a different configuration from 12/02/2020 CT, favoring improving recurrent pneumonia. 6. Three-vessel coronary atherosclerosis. 7. Aortic Atherosclerosis (ICD10-I70.0) and Emphysema (ICD10-J43.9). Electronically Signed   By: Ilona Sorrel M.D.   On: 02/04/2021 12:27   CT LUMBAR SPINE WO CONTRAST  Result Date: 02/02/2021 CLINICAL DATA:  70 year old male with lung cancer undergoing treatment. Increased falls. Right side low back pain. Altered mental status. EXAM: CT LUMBAR SPINE WITHOUT CONTRAST TECHNIQUE: Multidetector CT imaging of the lumbar spine was performed without intravenous contrast administration. Multiplanar CT image reconstructions were also generated. COMPARISON:  CT chest and pelvis today reported separately. Lumbar MRI 04/13/2015. CT Abdomen and Pelvis 02/16/2020. FINDINGS: Segmentation: Normal. Alignment: Stable lumbar lordosis since January. No spondylolisthesis. Vertebrae: Round lucent/lytic lesion of the L3 anterior superior vertebral body (coronal image 17 and series 4, image 55) has  partially sclerotic margin and abuts the superior endplate, but there was no is new since January with no evidence of developing Schmorl's node at that time. The lesion is about 16 mm diameter. And background bone mineralization appears more heterogeneous since January. But there is no other discrete osseous lesion identified.  Chronic sclerosis of the medial left iliac bone appears stable and benign. Paraspinal and other soft tissues: Aortoiliac calcified atherosclerosis. Left Common iliac artery vascular stent. Some oral contrast is present in bowel. Lumbar paraspinal soft tissues remain within normal limits. Disc levels: T12-L1:  Negative. L1-L2:  Mild disc bulging.  No stenosis. L2-L3: Circumferential disc bulge appears progressed since 2017, with broad-based posterior component. However, there is no significant stenosis. L3-L4: Minor disc bulging appears increased since 2017. No stenosis. L4-L5: Mild disc bulging appears increased since 2017. No stenosis. L5-S1:  Mild disc bulging appears stable since 2017.  No stenosis. IMPRESSION: 1. A 16 mm lucent lesion of the L3 vertebral body is new since January and more suspicious for osseous metastatic disease than a degenerative Schmorl's node. Lumbar MRI (without and with contrast preferred) may characterize further. 2. No lumbar pathologic fracture or other acute finding by CT. Mild for age lumbar spine degeneration. Electronically Signed   By: Genevie Ann M.D.   On: 02/02/2021 11:10   CT PELVIS WO CONTRAST  Result Date: 02/02/2021 CLINICAL DATA:  Recent falls. EXAM: CT PELVIS WITHOUT CONTRAST TECHNIQUE: Multidetector CT imaging of the pelvis was performed following the standard protocol without intravenous contrast. COMPARISON:  November 02, 2020. FINDINGS: Urinary Tract: Bladder is decompressed by a suprapubic catheter. No abnormality visualized. Bowel:  Unremarkable visualized pelvic bowel loops. Vascular/Lymphatic: No pathologically enlarged lymph nodes.  Aortoiliac atherosclerotic vascular disease. Reproductive:  Unchanged mild prostatomegaly. Other:  Unchanged splenomegaly, partially visualized. Musculoskeletal: No acute or significant osseous findings. IMPRESSION: 1. No acute osseous abnormality. 2. Unchanged splenomegaly. 3. Aortic Atherosclerosis (ICD10-I70.0). Electronically Signed   By: Titus Dubin M.D.   On: 02/02/2021 11:06   MR BRAIN W WO CONTRAST  Result Date: 02/03/2021 CLINICAL DATA:  History of lung cancer and lymphoma, presents with altered mental status EXAM: MRI HEAD WITHOUT AND WITH CONTRAST TECHNIQUE: Multiplanar, multiecho pulse sequences of the brain and surrounding structures were obtained without and with intravenous contrast. CONTRAST:  7.50mL GADAVIST GADOBUTROL 1 MMOL/ML IV SOLN COMPARISON:  CT head 02/01/2021, MR head 09/29/2020 FINDINGS: The coronal T1 postcontrast images are markedly motion degraded. Brain: There is no evidence of acute intracranial hemorrhage, extra-axial fluid collection, or acute infarct. There is mild global parenchymal volume loss with commensurate enlargement of the ventricular system. Patchy foci of FLAIR signal abnormality in the subcortical and periventricular white matter likely reflects sequela of mild chronic white matter microangiopathy. There is no mass lesion or abnormal enhancement, within the confines of motion degraded coronal T1 postcontrast images. There is no midline shift. Vascular: Normal flow voids. Skull and upper cervical spine: Normal marrow signal. Sinuses/Orbits: There is a mucous retention cyst in the left maxillary sinus. Globes and orbits are unremarkable. Other: None. IMPRESSION: No evidence of intracranial metastatic disease, though the coronal T1 postcontrast images are markedly motion degraded. Electronically Signed   By: Valetta Mole M.D.   On: 02/03/2021 20:59   MR LUMBAR SPINE WO CONTRAST  Result Date: 02/02/2021 CLINICAL DATA:  Lumbar radiculopathy, symptoms persist  with > 6 wks treatment EXAM: MRI LUMBAR SPINE WITHOUT CONTRAST TECHNIQUE: Multiplanar, multisequence MR imaging of the lumbar spine was performed. No intravenous contrast was administered. COMPARISON:  CT of the lumbar spine 02/02/2021. MRI of the lumbar spine 04/12/2005. FINDINGS: The patient refused postcontrast imaging. Segmentation: Standard segmentation is assumed. Alignment:  Normal Vertebrae: Approximately 2 cm round lesion within the anterior/superior L3 vertebral body, which is T1 hypointense and T2/STIR intermediate in signal with surrounding  STIR hyperintense edema. Approximately 10 mm L2 and 8 mm L1 lesions, which are T1 and T2 hypointense (series 3 and 4, image 9) without appreciable STIR hyperintensity. Conus medullaris and cauda equina: Conus extends to the T2 level level. Conus appears normal. Paraspinal and other soft tissues: Unremarkable. Disc levels: T12-L1: No significant disc protrusion, foraminal stenosis, or canal stenosis. L1-L2: No significant disc protrusion, foraminal stenosis, or canal stenosis. L2-L3: No significant disc protrusion, foraminal stenosis, or canal stenosis. L3-L4: Mild facet arthropathy and slight disc bulge without significant stenosis. L4-L5: Small broad disc bulge and mild bilateral facet arthropathy. No significant canal or foraminal stenosis. L5-S1: No significant disc protrusion, foraminal stenosis, or canal stenosis. IMPRESSION: 1. Approximately 2 cm round lesion within the anterior/superior L3 vertebral body, concerning for metastatic disease in this patient with known lung cancer. Surrounding marrow edema may be reactive or represent superimposed pathologic fracture. 2. Approximately 10 mm L2 and 8 mm L1 lesions are indeterminate. The patient refused contrast on this study. If the patient is willing, postcontrast imaging may be helpful to better evaluate. Alternatively, follow-up MRI could assess stability. 3. No significant canal or foraminal stenosis.  Electronically Signed   By: Margaretha Sheffield M.D.   On: 02/02/2021 15:08   CT BIOPSY  Result Date: 02/04/2021 Criselda Peaches, MD     02/04/2021 12:37 PM Interventional Radiology Procedure Note Procedure: CT biopsy of L3 lesion Complications: None Estimated Blood Loss: None Recommendations: - Bedrest x 1 hr - Path sent Signed, Criselda Peaches, MD   DG Chest Port 1 View  Result Date: 02/02/2021 CLINICAL DATA:  70 year old male with altered mental status and shortness of breath. History of lymphoma. And history of malignant cells on left upper lobe needle biopsy in October. EXAM: PORTABLE CHEST 1 VIEW COMPARISON:  Chest radiographs 02/01/2021 and earlier. FINDINGS: Portable AP semi upright view at 0740 hours. Stable right chest power port. Larger lung volumes. Normal cardiac size and mediastinal contours. Visualized tracheal air column is within normal limits. Left mid lung nodule with adjacent surgical clip appears grossly stable from October CT (please see that report). No superimposed pneumothorax, pulmonary edema, pleural effusion or new pulmonary opacity. No acute osseous abnormality identified. IMPRESSION: 1. Grossly stable left lung nodule corresponding to known non-small cell carcinoma. 2. Larger lung volumes with no new cardiopulmonary abnormality. Electronically Signed   By: Genevie Ann M.D.   On: 02/02/2021 07:56

## 2021-02-05 NOTE — Care Management Important Message (Signed)
Important Message  Patient Details  Name: Ryan Copeland MRN: [id number] Date of Birth: [birth year]   Medicare Important Message Given:  Yes     [name] 02/05/2021, 3:26 PM

## 2021-02-05 NOTE — Progress Notes (Signed)
Patient back from [facility] cancer center, no complaints at this time. Wife at bedside. Bed alarm on and call bell in reach.

## 2021-02-05 NOTE — Progress Notes (Signed)
[facility] here to transport patient to [facility] cancer center. Patient aware and agreeable to transport.

## 2021-02-05 NOTE — Progress Notes (Signed)
I spoke with Dr. Melina Copa in Pathology and she confirmed the patient has carcinoma in the L3 biopsy, IHC will result next week but this is most likely metastatic lung cancer. Dr. Lindi Adie has ordered molecular testing on his lung specimen to help guide systemic therapy when Dr. Alvy Bimler is back. He is still confused and we appreciate Dr. Renda Rolls input.   With the preliminary results of his L3 biopsy, I spoke with his wife and let her know that Dr. Lisbeth Renshaw would like to offer a palliative course of 10 fxns of radiotherapy to this level of the spine. We will coordinate [facility] to bring him for simulation today and he can go back to [facility] afterwards. We will start his treatment next Tuesday and anticipate pain relief will start the second half of the treatment.  We discussed the risks, benefits, short, and long term effects of radiotherapy, as well as the palliative intent, as well as delivery and logistics of radiotherapy. We have verbal consent to proceed from his wife Jayston Trevino.      Carola Rhine, PAC

## 2021-02-05 NOTE — Consult Note (Signed)
Consultation Note Date: 02/05/2021   Patient Name: Ryan Copeland  DOB: 02/20/1950  MRN: [id number]  Age / Sex: 70 y.o., male  PCP: Ryan Copeland., PA-C Referring Physician: Thurnell Lose, MD  Reason for Consultation: Establishing goals of care "metastatic lung cancer"  HPI/Patient Profile: 70 y.o. 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 Wife reported he has also been unsteady on his feet and had 2 unwitnessed falls in the past 3 days. CT of the head was negative. Chest x-ray showed possible left lung base pneumonia. Admitted to [facility] with acute metabolic encephalopathy.   Clinical Assessment and Goals of Care: I have reviewed medical records including EPIC notes, labs and imaging, and met at bedside with patient and wife to discuss diagnosis, prognosis, GOC, EOL wishes, disposition, and options. Patient is OOB to the recliner. He is unable to tell me why he is in the hospital. He tells me "I feel like crap".   As patient is unable to participate, [place] discussion is had with his wife/Ryan Copeland. I introduced Palliative Medicine as specialized medical care for people living with serious illness. It focuses on providing relief from the symptoms and stress of a serious illness.   We discussed a brief life review of the patient. He and Ryan Copeland have been married for 45 years. They have a son/Ryan Copeland who lives in [place]. There is also a step-daughter (Ryan Copeland's daughter from a previous relationship). Patient is retired from a career as an [employer] consultant.   We discussed patient's current illness and what it means in the larger context of his/her ongoing co-morbidities. I provided education on the natural  disease trajectory of advanced cancer, emphasizing that functional status is generally preserved until late in the disease course, followed by a precipitous decline over weeks to months. Discussed that the onset of decline usually suggests worsening disease.   Created space and opportunity for Ryan Copeland to express thoughts and feelings regarding patient's current medical situation. She expresses frustration that he has been in the hospital 8 times in the past year. Emotional support provided   Ryan Copeland expresses appreciation for the care provided from the oncology team. She verbalizes understanding that the L3 biopsy showed carcinoma, which is most likely metastatic lung cancer. We discussed the plan for 10 treatments of radiotherapy to the spine. She understands intent of treatment is palliative rather than curative. She is hopeful radiation will provide some pain relief of his back pain.  The difference between aggressive medical intervention and comfort care was considered. At this point, Ryan Copeland's main goal is for Ryan Copeland to have better pain control. We discussed that the hydrocodone (home med) was stopped because there was concern it may be contributing to his confusion.  Ryan Copeland feels quite strongly that the hydrocodone is not contributing to his confusion and would like this re-started.   Discussed additional options  for symptom management including starting steroids to help with bone pain. As patient is not sleeping well in the hospital, we also discussed re-starting ambien (patient takes this every night at home). Ryan Copeland expresses appreciation for PMT support.  Questions and concerns were addressed.  The family was encouraged to call with questions or concerns.    Primary decision maker: wife Ryan Copeland    SUMMARY OF RECOMMENDATIONS   Dexamethasone 4 mg IV x 1 dose (start tomorrow), then dexamethasone 4 mg by mouth daily Toradol 30 mg IV now x 1 dose Celebrex 200 mg BID (start tomorrow) Scheduled  hydrocodone-acetaminophen 10-325 mg every 6 hours Dilaudid 0.5-1 mg every 3 hours as needed for breakthrough pain Xanax 0.5 mg BID  Ambien 10 mg at bedtime as needed for sleep  PMT will continue to follow.  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 The following aspects are pertinent.  Past Medical History:  Diagnosis Date   Anxiety 01/30/2014   Back injury    lower disc   CAD (coronary artery disease)    CLL (chronic lymphocytic leukemia) ([place]) 03/18/2011   COPD (chronic obstructive pulmonary disease) (HCC)    DM type 2 (diabetes mellitus, type 2) (HCC)    ECRB (extensor carpi radialis brevis) tenosynovitis    Foley catheter in place 11/2019   last changed 04-14-2020   GERD (gastroesophageal reflux disease)    takes Nexium if needed   History of blood transfusion 2015   Hyperlipidemia    Insomnia    Long-term current use of intravenous immunoglobulin (IVIG)    q month ivig   MI, acute, non ST segment elevation ([place]) 06/28/2009   with stenting of the LAD   Neuromuscular disorder (HCC)    peripheral neuropathy both all toes   Pneumonia 11/2019   10/2020   PVD (peripheral vascular disease) (HCC)    Thrombocytopenia (HCC)    Tobacco abuse     Scheduled Meds:  acyclovir  400 mg Oral BID   allopurinol  300 mg Oral Daily   benzonatate  100 mg Oral TID   Chlorhexidine Gluconate Cloth  6 each Topical Daily   famotidine  20 mg Oral BID   heparin  lock flush  500 Units Intracatheter Q30 days   ketorolac  30 mg Intravenous Once   midodrine  15 mg Oral TID WC   rosuvastatin  5 mg Oral Daily   Continuous Infusions:  dextrose 100 mL/hr at 02/05/21 1529   PRN Meds:.acetaminophen, diphenoxylate-atropine, haloperidol lactate, heparin lock flush **AND** heparin lock flush, sodium chloride flush   Allergies  Allergen Reactions   Immune Globulin Hives and Rash    Pt reports hives/rash on body with IV Privigen. He was changed to IV Gamunex-C and had many infusions without adverse side effects.    Codeine Hives    Pt states he can take a few, more reaction with extended doses.   Review of Systems  Musculoskeletal:  Positive for back pain.   Physical Exam  Vitals reviewed.  Constitutional:      General: He is not in acute distress.    Appearance: He is ill-appearing.  Pulmonary:     Effort: Pulmonary effort is normal.  Neurological:     Mental Status: He is alert. He is confused.     Motor: Weakness present.    Vital Signs: BP (!) 117/52    Pulse (!) 59    Temp 98.1 F (36.7 C) (Oral)    Resp 18    SpO2 98%  Pain Scale: 0-10   Pain Score: 0-No pain   SpO2: SpO2: 98 % O2 Device:SpO2: 98 % O2 Flow Rate: .O2 Flow Rate (L/min): 2 L/min     Palliative Assessment/Data: PPS 30-40%     Time In: 1650 Time Out: 1803 Time Total: 73 minutes Greater than 50%  of this time was spent counseling and coordinating care related to the above assessment and plan.  Signed by: Lavena Bullion, NP   Please contact Palliative Medicine Team phone at [phone] for questions and concerns.  For individual provider: See Shea Evans

## 2021-02-05 NOTE — Progress Notes (Signed)
Patient pulled out PAC needle and tear apart of tubing last night. Keeping in the [facility] access was not safe for patient. Dr. Candiss Norse notified this matter and deaccessed with heparinized with 500 units in the morning. Put in the PIV access and wrapped with Kerlix. Informed patient's RN to frequent monitor PIV access and prevent removing. HS [employer]

## 2021-02-06 ENCOUNTER — Inpatient Hospital Stay (HOSPITAL_COMMUNITY): Payer: Medicare Other

## 2021-02-06 ENCOUNTER — Encounter: Payer: Self-pay | Admitting: Radiation Oncology

## 2021-02-06 DIAGNOSIS — J189 Pneumonia, unspecified organism: Secondary | ICD-10-CM

## 2021-02-06 DIAGNOSIS — C3492 Malignant neoplasm of unspecified part of left bronchus or lung: Secondary | ICD-10-CM | POA: Diagnosis not present

## 2021-02-06 DIAGNOSIS — R001 Bradycardia, unspecified: Secondary | ICD-10-CM

## 2021-02-06 LAB — CBC WITH DIFFERENTIAL/PLATELET
Abs Immature Granulocytes: 0.07 10*3/uL (ref 0.00–0.07)
Basophils Absolute: 0 10*3/uL (ref 0.0–0.1)
Basophils Relative: 1 %
Eosinophils Absolute: 0.1 10*3/uL (ref 0.0–0.5)
Eosinophils Relative: 3 %
HCT: 38.5 % — ABNORMAL LOW (ref 39.0–52.0)
Hemoglobin: 12.6 g/dL — ABNORMAL LOW (ref 13.0–17.0)
Immature Granulocytes: 2 %
Lymphocytes Relative: 9 %
Lymphs Abs: 0.3 10*3/uL — ABNORMAL LOW (ref 0.7–4.0)
MCH: 28.6 pg (ref 26.0–34.0)
MCHC: 32.7 g/dL (ref 30.0–36.0)
MCV: 87.3 fL (ref 80.0–100.0)
Monocytes Absolute: 0.8 10*3/uL (ref 0.1–1.0)
Monocytes Relative: 21 %
Neutro Abs: 2.4 10*3/uL (ref 1.7–7.7)
Neutrophils Relative %: 64 %
Platelets: 56 10*3/uL — ABNORMAL LOW (ref 150–400)
RBC: 4.41 MIL/uL (ref 4.22–5.81)
RDW: 16.8 % — ABNORMAL HIGH (ref 11.5–15.5)
WBC: 3.7 10*3/uL — ABNORMAL LOW (ref 4.0–10.5)
nRBC: 0 % (ref 0.0–0.2)

## 2021-02-06 LAB — COMPREHENSIVE METABOLIC PANEL
ALT: 16 U/L (ref 0–44)
AST: 18 U/L (ref 15–41)
Albumin: 3.2 g/dL — ABNORMAL LOW (ref 3.5–5.0)
Alkaline Phosphatase: 70 U/L (ref 38–126)
Anion gap: 10 (ref 5–15)
BUN: 14 mg/dL (ref 8–23)
CO2: 21 mmol/L — ABNORMAL LOW (ref 22–32)
Calcium: 9 mg/dL (ref 8.9–10.3)
Chloride: 112 mmol/L — ABNORMAL HIGH (ref 98–111)
Creatinine, Ser: 0.95 mg/dL (ref 0.61–1.24)
GFR, Estimated: >60 mL/min (ref >60)
Glucose, Bld: 64 mg/dL — ABNORMAL LOW (ref 70–99)
Potassium: 3.3 mmol/L — ABNORMAL LOW (ref 3.5–5.1)
Sodium: 143 mmol/L (ref 135–145)
Total Bilirubin: 1 mg/dL (ref 0.3–1.2)
Total Protein: 5.8 g/dL — ABNORMAL LOW (ref 6.5–8.1)

## 2021-02-06 LAB — PROCALCITONIN: Procalcitonin: 0.12 ng/mL

## 2021-02-06 LAB — C-REACTIVE PROTEIN: CRP: 0.8 mg/dL (ref <1.0)

## 2021-02-06 LAB — CULTURE, BLOOD (ROUTINE X 2)
Culture: NO GROWTH
Culture: NO GROWTH

## 2021-02-06 LAB — TSH: TSH: 5.77 u[IU]/mL — ABNORMAL HIGH (ref 0.350–4.500)

## 2021-02-06 LAB — GLUCOSE, CAPILLARY: Glucose-Capillary: 90 mg/dL (ref 70–99)

## 2021-02-06 LAB — MAGNESIUM: Magnesium: 1.6 mg/dL — ABNORMAL LOW (ref 1.7–2.4)

## 2021-02-06 LAB — BRAIN NATRIURETIC PEPTIDE: B Natriuretic Peptide: 276.7 pg/mL — ABNORMAL HIGH (ref 0.0–100.0)

## 2021-02-06 MED ORDER — LORAZEPAM 2 MG/ML PO CONC: 1.0000 mg | ORAL | Status: DC | PRN

## 2021-02-06 MED ORDER — HALOPERIDOL LACTATE 5 MG/ML IJ SOLN: 2.0000 mg | Freq: Three times a day (TID) | INTRAMUSCULAR | Status: DC | PRN

## 2021-02-06 MED ORDER — HYDROMORPHONE HCL 1 MG/ML IJ SOLN
0.5000 mg | INTRAMUSCULAR | Status: DC | PRN
Start: 2021-02-06 — End: 2021-02-08
  Administered 2021-02-07: 1 mg via INTRAVENOUS
  Filled 2021-02-06 (×2): qty 1

## 2021-02-06 MED ORDER — GLYCOPYRROLATE 0.2 MG/ML IJ SOLN: 0.2000 mg | INTRAMUSCULAR | Status: DC | PRN

## 2021-02-06 MED ORDER — HALOPERIDOL LACTATE 5 MG/ML IJ SOLN
2.0000 mg | Freq: Three times a day (TID) | INTRAMUSCULAR | Status: DC | PRN
  Administered 2021-02-06: 2 mg via INTRAVENOUS

## 2021-02-06 MED ORDER — GLYCOPYRROLATE 1 MG PO TABS
1.0000 mg | ORAL_TABLET | ORAL | Status: DC | PRN
  Filled 2021-02-06: qty 1

## 2021-02-06 MED ORDER — MAGNESIUM SULFATE 2 GM/50ML IV SOLN
2.0000 g | Freq: Once | INTRAVENOUS | Status: AC
  Administered 2021-02-06: 2 g via INTRAVENOUS
  Filled 2021-02-06: qty 50

## 2021-02-06 MED ORDER — ONDANSETRON HCL 4 MG/2ML IJ SOLN: 4.0000 mg | Freq: Four times a day (QID) | INTRAMUSCULAR | Status: DC | PRN

## 2021-02-06 MED ORDER — ONDANSETRON 4 MG PO TBDP: 4.0000 mg | ORAL_TABLET | Freq: Four times a day (QID) | ORAL | Status: DC | PRN

## 2021-02-06 MED ORDER — DEXTROSE IN LACTATED RINGERS 5 % IV SOLN: INTRAVENOUS | Status: DC

## 2021-02-06 MED ORDER — POLYVINYL ALCOHOL 1.4 % OP SOLN
1.0000 [drp] | Freq: Four times a day (QID) | OPHTHALMIC | Status: DC | PRN
  Filled 2021-02-06: qty 15

## 2021-02-06 MED ORDER — LORAZEPAM 2 MG/ML IJ SOLN
1.0000 mg | INTRAMUSCULAR | Status: DC | PRN
  Administered 2021-02-07: 1 mg via INTRAVENOUS
  Filled 2021-02-06 (×3): qty 1

## 2021-02-06 MED ORDER — LORAZEPAM 1 MG PO TABS: 1.0000 mg | ORAL_TABLET | ORAL | Status: DC | PRN

## 2021-02-06 MED ORDER — HALOPERIDOL LACTATE 5 MG/ML IJ SOLN
2.0000 mg | Freq: Three times a day (TID) | INTRAMUSCULAR | Status: DC | PRN
  Administered 2021-02-07 (×2): 2 mg via INTRAVENOUS
  Filled 2021-02-06 (×2): qty 1

## 2021-02-06 MED ORDER — BIOTENE DRY MOUTH MT LIQD: 15.0000 mL | OROMUCOSAL | Status: DC | PRN

## 2021-02-06 NOTE — Significant Event (Signed)
Rapid Response Event Note   Reason for Call :  Bradycardia. HR down to 28 bpm, non-sustained. HR sustaining 32-46 bpm.   Initial Focused Assessment:  Pt lying in bed, disoriented. Opens eyes to voice. Lung sounds are clear. Heart sound are regular, slow. Skin is warm, moist. Extremities are warm.  Review of telemetry reveals pt converted from sinus bradycardia to an idioventricular rhythm with a rate of 41-46 for approximately 1 minute at 1355.   Per pt's wife, pt has been progressively lethargic over the last few days.   VS: T 97.40F, BP 153/71, HR 33, RR 12, SpO2 95% on room air CBG: 90  Interventions:  -EKG -CBG  Plan of Care:  -RN to notify MD regarding changes in pt heart rate -Continuous telemetry, as ordered  Call rapid response for additional needs  Event Summary:  MD Notified: Dr. Candiss Norse Call Time: 8677 Arrival Time: 3736 End Time: [place], RN

## 2021-02-06 NOTE — Plan of Care (Signed)
  Problem: Clinical Measurements: Goal: Respiratory complications will improve Outcome: Progressing   Problem: Clinical Measurements: Goal: Cardiovascular complication will be avoided Outcome: Progressing   

## 2021-02-06 NOTE — Progress Notes (Signed)
°   02/05/21 2225  Assess: MEWS Score  Temp 98.4 F (36.9 C)  BP (!) 103/46  Pulse Rate (!) 36  Resp 18  SpO2 98 %  O2 Device Room Air  Assess: MEWS Score  MEWS Temp 0  MEWS Systolic 0  MEWS Pulse 2  MEWS RR 0  MEWS LOC 0  MEWS Score 2  MEWS Score Color Yellow  Assess: if the MEWS score is Yellow or Red  Were vital signs taken at a resting state? Yes  Focused Assessment No change from prior assessment  Early Detection of Sepsis Score *See Row Information* Low  MEWS guidelines implemented *See Row Information* Yes  Treat  MEWS Interventions Other (Comment) (assessed)  Pain Scale 0-10  Pain Score 0  Take Vital Signs  Increase Vital Sign Frequency  Yellow: Q 2hr X 2 then Q 4hr X 2, if remains yellow, continue Q 4hrs  Escalate  MEWS: Escalate Yellow: discuss with charge nurse/RN and consider discussing with provider and RRT  Notify: Charge Nurse/RN  Name of Charge Nurse/RN Notified Cheryl, RN  Date Charge Nurse/RN Notified 02/05/21  Time Charge Nurse/RN Notified 2225  Notify: Provider  Provider Name/Title Dr. Alcario Drought  Date Provider Notified 02/06/21  Time Provider Notified [phone]  Notification Type Page  Notification Reason Other (Comment) (pulse)  Document  Patient Outcome Other (Comment)

## 2021-02-06 NOTE — Progress Notes (Signed)
°   02/06/21 1417  Assess: MEWS Score  BP (!) 153/71  Pulse Rate (!) 33  Resp 12  Level of Consciousness Alert  SpO2 95 %  O2 Device Room Air  Assess: MEWS Score  MEWS Temp 0  MEWS Systolic 0  MEWS Pulse 2  MEWS RR 1  MEWS LOC 0  MEWS Score 3  MEWS Score Color Yellow  Assess: if the MEWS score is Yellow or Red  Were vital signs taken at a resting state? Yes  Focused Assessment No change from prior assessment  Early Detection of Sepsis Score *See Row Information* Low  Treat  Pain Scale Faces  Faces Pain Scale 0  Notify: Provider  Provider Name/Title Candiss Norse  Date Provider Notified 02/06/21  Time Provider Notified 1410  Notification Type Page  Notification Reason Change in status  Provider response See new orders;Other (Comment) (ekg done vitals done)  Date of Provider Response 02/06/21  Time of Provider Response 1417  Notify: Rapid Response  Name of Rapid Response RN Notified Veronia Beets  Date Rapid Response Notified 02/06/21  Time Rapid Response Notified 8335  Document  Patient Outcome Other (Comment) (intervions ordered)   Patient was started on yellow Mew but before next set of vital needed was changed to comfort  care

## 2021-02-06 NOTE — Progress Notes (Signed)
Daily Progress Note   Patient Name: Ryan Copeland       Date: 02/06/2021 DOB: 02-08-1951  Age: 70 y.o. MRN#: [id number] Attending Physician: Thurnell Lose, MD Primary Care Physician: [name]., PA-C Admit Date: 02/01/2021   HPI/Patient Profile: 70 y.o. 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 Wife reported he has also been unsteady on his feet and had 2 unwitnessed falls in the past 3 days. CT of the head was negative. Chest x-ray showed possible left lung base pneumonia. Admitted to [facility] with acute metabolic encephalopathy.     Subjective: Per chart review, patient has had several episodes of bradycardia today with HR down to 20's-30's while sleeping. Per cardiology, he is not a candidate for a pacemaker due to highly probable metastatic cancer.   I went to see patient at bedside. He is currently somnolent. I spoke with his wife Ryan Copeland by phone.  The difference between full scope medical intervention and comfort care was considered. We reviewed the concept of a comfort path, emphasizing that this path involves de-escalating and stopping full scope medical interventions, allowing a natural course to occur. Discussed that the goal is comfort and dignity rather than cure/prolonging life. Ryan Copeland fully agrees that goal of care should be to "keep him comfortable".   Discussed transitioning to comfort care in house, and what that would look like--keeping him clean and dry, no labs, no artificial hydration or feeding, no antibiotics, minimizing of medications, comfort feeds, and  medication for pain and dyspnea.   Ryan Copeland does not think that he will be able to tolerate continued radiation treatment at this time.    Length of Stay: 4    Physical Exam Vitals reviewed.  Constitutional:      General: He is not in acute distress.    Appearance: He is ill-appearing.     Comments: somnolent  Pulmonary:     Effort: Pulmonary effort is normal.  Neurological:     Motor: Weakness present.            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 - end of life care  Recommendations/Plan: Full comfort measures initiated Added orders for symptom management at EOL Continue Celebrex 200 mg BID  Continue scheduled hydrocodone-acetaminophen 10-325 mg every 6 hours Dilaudid 0.5-1 mg every 3 hours as needed for breakthrough pain Xanax 0.5 mg BID  PMT will continue to follow    Goals of Care and Additional Recommendations: Limitations on Scope of Treatment: Full Comfort Care  Code Status: DNR/DNI  Prognosis:  < 2 weeks  Discharge Planning: To Be Determined    Thank you for allowing the Palliative Medicine Team to assist in the care of this patient.   Total Time 35 minutes Prolonged Time Billed  no       Greater than 50%  of this time was spent counseling and coordinating care related to the above assessment and plan.  Lavena Bullion, NP  Please contact Palliative Medicine Team phone at [phone] for questions and concerns.

## 2021-02-06 NOTE — Progress Notes (Addendum)
PROGRESS NOTE                                                                                                                                                                                                             Patient Demographics:    Ryan Copeland, is a 70 y.o. male, DOB - 1950-11-06, GSU:[id number]  Outpatient Primary MD for the patient is [name]., PA-C    LOS - 4  Admit date - 02/01/2021    Chief Complaint  Patient presents with   Altered Mental Status       Brief Narrative (HPI from H&P)   Ryan Copeland is a 70 y.o. 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   Subjective:   Patient in bed, appears comfortable, denies any headache, no fever, no chest pain or pressure, no shortness of breath , no abdominal pain. No new focal weakness. + back pain, ++ confused.   Assessment  & Plan :   Had long discussion with patient's wife - DNR, Med Rx, directed towards comfort, radiation for pain control OK - palliative. No further heroics, no pacemaker, if further decline then Full Comfort Care  Signature  Lala Lund M.D on 02/06/2021 at 3:19 PM                 Toxic encephalopathy in a patient with non-small cell cancer of the lung with now possible L3 metastatic lesion, chronic indwelling Foley catheter  - he is currently headache free with no neck stiffness or photophobia, head CT unremarkable.  Most likely source of his encephalopathy is ongoing lower back pain with lack of good quality sleep and being on narcotics for the back pain.  MRI brain nonacute no focal deficits.  Continue  supportive care minimize narcotic use as much as needed, twice daily Seroquel to see if it helps with his agitation and encephalopathy, as needed Haldol and restraints are added as well.  Patient if he is having some meningeal spread of his malignancy, discussed with Dr. Mickeal Skinner neuro oncologist on 02/05/2021.  For  now monitor.  In the future may require LP in the outpatient setting.  For now continue supportive care palliative care also assisting in formulating plan.  Ongoing lower back pain for the last 1 week.  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  Pain control is difficult as above due to narcotics causing worsening of encephalopathy and tramadol not able to hold his pain, most likely radiation treatments will be started this admission may require transferring to [facility] for that.  Non-small cell left lung cancer now possible mets to the L-spine ? Mild meningeal spread.  Recently completed radiation treatment.  Under the care of Dr. Valeta Harms pulmonary and Dr. Lisbeth Renshaw radiation oncology, CT scan noted, biopsy results prelim suggestive of metastatic lesion to the L-spine.  Discussed with Dr. Valeta Harms, will follow in the office.  Prognosis does not appear excellent.  Small cell B-cell lymphoma (CLL) - under the care of Dr. Alvy Bimler Case discussed with her, hold Calquence in the acute setting, defer management to oncology.  Questionable pneumonia on admission.  Ruled out clinically.  I do not think he has active pneumonia have titrated off antibiotics, cultures have remained negative.  Hypogammaglobinemia requiring monthly IVIG treatment.  Supportive care.  Chronic indwelling suprapubic catheter.  Catheter changed on  02/01/2021.  Monitor.  UA appears stable.  History of gout.  Continue acyclovir.  Dyslipidemia.  On statin.   History of CAD with LAD stent.  Aortic root dilation.  Blood pressure low hence no beta-blocker, severe baseline thrombocytopenia hence no aspirin.  Continue statin for supportive care with outpatient PCP and cardiology follow-up  Hypokalemia.  Replaced.    Chronic moderate to severe thrombocytopenia.  Monitor.  Lab Results  Component Value Date   PLT 56 (L) 02/06/2021    Addendum - getting bradycardic - asymptomatic, Rate drops into 40s, upon sleeping at 2pm - low 20s, ? Home medicine Midodrene - stop, check TSH and EKG, no other potential bradycardic agents, poor pacemaker candidate, Cards input x 1, ? Short term Isoproterenol - BP stable.    His care was coordinated with discussions with following physicians   Dr. Alvy Bimler, Dr. Sonny Dandy, Dr. Lisbeth Renshaw, oncology PA Ms. Worthy Flank, Dr. June Leap, Dr. Dominica Severin cram neurosurgery.   DW Dr Mickeal Skinner - 02/05/21       Condition - Extremely Guarded  Family Communication  : Wife [name] [phone] on 02/02/2021, 02/03/2021 x 2, 02/04/21, called wife twice on 02/05/2021/left at 10:45 AM, called again at 32 AM no response, 02/06/21  Code Status :  DNR  Consults  :  Oncology, radiation oncology, IR, pulmonary over the phone  PUD Prophylaxis :  Pepcid   Procedures  :     L 3 Biopsy 02/04/21  MRI Brain - non acute  CT Chest - 1. Clear progression of the left upper lobe pulmonary nodule, now measuring 1.6 cm in the same dimension that was previously measured at 1.2 cm. 2. Interval progression of patchy and nodular airspace disease in the left lower lobe with associated volume loss. Imaging features likely related to infectious/inflammatory etiology. 3. Aortic Atherosclerosis  MRI L Spine -  1. Approximately 2 cm round lesion within the anterior/superior L3 vertebral body, concerning for metastatic disease in this patient with  known lung cancer. Surrounding marrow  edema may be reactive or represent superimposed pathologic fracture. 2. Approximately 10 mm L2 and 8 mm L1 lesions are indeterminate. The patient refused contrast on this study. If the patient is willing, postcontrast imaging may be helpful to better evaluate. Alternatively, follow-up MRI could assess stability. 3. No significant canal or foraminal stenosis.      Disposition Plan  :    Status is: Inpt  DVT Prophylaxis  :     SCDs Start: 02/01/21 2047   Lab Results  Component Value Date   PLT 56 (L) 02/06/2021    Diet :  Diet Order             Diet regular Room service appropriate? Yes; Fluid consistency: Thin  Diet effective now                    Inpatient Medications  Scheduled Meds:  acyclovir  400 mg Oral BID   allopurinol  300 mg Oral Daily   ALPRAZolam  0.5 mg Oral BID   benzonatate  100 mg Oral TID   celecoxib  200 mg Oral BID   Chlorhexidine Gluconate Cloth  6 each Topical Daily   famotidine  20 mg Oral BID   heparin lock flush  500 Units Intracatheter Q30 days   HYDROcodone-acetaminophen  1 tablet Oral Q6H   midodrine  15 mg Oral TID WC   rosuvastatin  5 mg Oral Daily   Continuous Infusions:   PRN Meds:.acetaminophen, diphenoxylate-atropine, haloperidol lactate **OR** haloperidol lactate, heparin lock flush **AND** heparin lock flush, HYDROmorphone (DILAUDID) injection, sodium chloride flush, zolpidem  Antibiotics  :    Anti-infectives (From admission, onward)    Start     Dose/Rate Route Frequency Ordered Stop   02/02/21 1000  vancomycin (VANCOCIN) IVPB 1000 mg/200 mL premix  Status:  Discontinued        1,000 mg 200 mL/hr over 60 Minutes Intravenous Every 12 hours 02/02/21 0008 02/04/21 0720   02/02/21 0200  ceFEPIme (MAXIPIME) 2 g in sodium chloride 0.9 % 100 mL IVPB  Status:  Discontinued        2 g 200 mL/hr over 30 Minutes Intravenous Every 8 hours 02/02/21 0008 02/05/21 1626   02/01/21 2200  acyclovir  (ZOVIRAX) tablet 400 mg        400 mg Oral 2 times daily 02/01/21 2108     02/01/21 1745  vancomycin (VANCOREADY) IVPB 1750 mg/350 mL        1,750 mg 175 mL/hr over 120 Minutes Intravenous  Once 02/01/21 1733 02/01/21 2255   02/01/21 1745  piperacillin-tazobactam (ZOSYN) IVPB 3.375 g        3.375 g 12.5 mL/hr over 240 Minutes Intravenous  Once 02/01/21 1733 02/01/21 2255        Time Spent in minutes  30   Lala Lund M.D on 02/06/2021 at 12:32 PM  To page go to [email or web address]   [facility] -  Office  [phone]  See all Orders from today for further details    Objective:   Vitals:   02/06/21 0003 02/06/21 0308 02/06/21 0735 02/06/21 1029  BP:  (!) 119/48 103/63 (!) 107/51  Pulse: (!) 41 (!) 47  (!) 42  Resp: 16 17  17   Temp:  98.5 F (36.9 C)  97.8 F (36.6 C)  TempSrc:  Oral    SpO2: 99% 98%  98%    Wt Readings from Last 3 Encounters:  01/29/21 78.4 kg  01/13/21 76.8 kg  01/04/21  76.5 kg     Intake/Output Summary (Last 24 hours) at 02/06/2021 1232 Last data filed at 02/06/2021 0700 Gross per 24 hour  Intake 904.09 ml  Output 175 ml  Net 729.09 ml     Physical Exam  Awake  but confused, No new F.N deficits,   [place].AT,PERRAL Supple Neck, No JVD,   Symmetrical Chest wall movement, Good air movement bilaterally, CTAB RRR,No Gallops, Rubs or new Murmurs,  +ve B.Sounds, Abd Soft, No tenderness,   No Cyanosis, suprapubic catheter      Data Review:    CBC Recent Labs  Lab 02/01/21 1617 02/02/21 0427 02/03/21 0439 02/04/21 0252 02/05/21 0507 02/06/21 1003  WBC 9.7 10.1 3.9* 2.8* 2.6* 3.7*  HGB 12.0* 11.6* 10.9* 11.2* 11.5* 12.6*  HCT 38.4* 36.3* 34.3* 34.8* 36.4* 38.5*  PLT 66* 54* 55* 48* 49* 56*  MCV 89.7 89.9 89.3 87.4 90.1 87.3  MCH 28.0 28.7 28.4 28.1 28.5 28.6  MCHC 31.3 32.0 31.8 32.2 31.6 32.7  RDW 17.1* 17.2* 17.0* 16.6* 16.7* 16.8*  LYMPHSABS 6.7*  --  1.8 0.7 0.5* 0.3*  MONOABS 0.6  --  0.4 0.4 0.4 0.8  EOSABS 0.0  --   0.0 0.1 0.1 0.1  BASOSABS 0.0  --  0.0 0.0 0.0 0.0    Electrolytes Recent Labs  Lab 02/01/21 1617 02/01/21 1618 02/02/21 0427 02/02/21 2137 02/03/21 0439 02/04/21 0252 02/05/21 0507 02/06/21 1003  NA 134*  --  136  --  139 138 146* 143  K 4.1  --  5.7*  --  3.7 3.3* 3.9 3.3*  CL 100  --  105  --  107 108 112* 112*  CO2 26  --  25  --  27 25 24  21*  GLUCOSE 115*  --  80  --  113* 93 56* 64*  BUN 14  --  14  --  16 13 14 14   CREATININE 0.98  --  0.87  --  0.82 0.85 1.01 0.95  CALCIUM 8.9  --  8.5*  --  8.8* 8.5* 9.0 9.0  AST 17  --   --   --  13* 15 20 18   ALT 25  --   --   --  17 16 18 16   ALKPHOS 86  --   --   --  71 66 77 70  BILITOT 0.3  --   --   --  0.6 0.8 1.1 1.0  ALBUMIN 3.6  --   --   --  2.7* 2.8* 3.2* 3.2*  MG  --   --   --  2.0 1.7 1.8 1.8 1.6*  CRP  --   --   --  3.7* 2.5* 0.8 0.7 0.8  PROCALCITON  --   --   --  <0.10 43.03 <0.10 <0.10 0.12  LATICACIDVEN  --  1.2  --   --   --   --   --   --   AMMONIA 14  --   --   --   --   --   --   --   BNP  --   --   --  96.8 114.3* 452.7* 405.2* 276.7*    ------------------------------------------------------------------------------------------------------------------ No results for input(s): CHOL, HDL, LDLCALC, TRIG, CHOLHDL, LDLDIRECT in the last 72 hours.  Lab Results  Component Value Date   HGBA1C 4.6 (L) 11/02/2020    No results for input(s): TSH, T4TOTAL, T3FREE, THYROIDAB in the last 72 hours.  Invalid input(s): FREET3 ------------------------------------------------------------------------------------------------------------------  ID Labs Recent Labs  Lab 02/01/21 1618 02/02/21 0427 02/02/21 2137 02/03/21 0439 02/04/21 0252 02/05/21 0507 02/06/21 1003  WBC  --  10.1  --  3.9* 2.8* 2.6* 3.7*  PLT  --  54*  --  55* 48* 49* 56*  CRP  --   --  3.7* 2.5* 0.8 0.7 0.8  PROCALCITON  --   --  <0.10 43.03 <0.10 <0.10 0.12  LATICACIDVEN 1.2  --   --   --   --   --   --   CREATININE  --  0.87  --  0.82 0.85  1.01 0.95   Cardiac Enzymes No results for input(s): CKMB, TROPONINI, MYOGLOBIN in the last 168 hours.  Invalid input(s): CK  Radiology Reports CT CHEST W CONTRAST  Result Date: 02/04/2021 CLINICAL DATA:  Inpatient. Non-small cell lung cancers status post SBRT in December. Restaging. EXAM: CT CHEST WITH CONTRAST TECHNIQUE: Multidetector CT imaging of the chest was performed during intravenous contrast administration. CONTRAST:  10mL OMNIPAQUE IOHEXOL 350 MG/ML SOLN COMPARISON:  02/02/2021 and 12/02/2020 chest CT. FINDINGS: Cardiovascular: Top-normal heart size. No significant pericardial effusion/thickening. Three-vessel coronary atherosclerosis. Right internal jugular Port-A-Cath terminates at the cavoatrial junction. Atherosclerotic nonaneurysmal thoracic aorta. Top-normal caliber main pulmonary artery (3.2 cm diameter). No central pulmonary emboli. Mediastinum/Nodes: No discrete thyroid nodules. Unremarkable esophagus. No axillary adenopathy. Enlarged 1.3 cm subcarinal node (series 3/image 84), newly enlarged since 12/02/2020, unchanged from 02/02/2021. Enlarged 1.5 cm left hilar node (series 3/image 84), unchanged from 02/02/2021, unchanged from 12/02/2020. No right hilar adenopathy. Lungs/Pleura: No pneumothorax. Trace to left pleural effusion. No right pleural effusion. Mild centrilobular and paraseptal emphysema with mild diffuse bronchial wall thickening. Spiculated solid 1.9 x 1.7 cm anterior left upper lobe pulmonary nodule (series 7/image 88), increased from 1.4 x 1.2 cm on 12/02/2020 CT, unchanged from 02/02/2021 CT. Irregular solid medial central left upper lobe 2.4 x 1.7 cm nodule (series 7/image 81), mildly increased from 2.2 x 1.8 cm on 12/02/2020, unchanged from 02/02/2021. Solid 0.8 cm superior segment left lower lobe pulmonary nodule (series 7/image 76), increased from 0.4 cm on 12/02/2020, unchanged from 02/02/2021. Anterior left lower lobe 1.0 cm solid pulmonary nodule (series 7/image  125), increased from 0.4 cm on 12/02/2020, unchanged from 02/02/2021. Patchy consolidation in the anterior and dependent basilar left lower lobe is minimally improved from 02/02/2021 and largely new/in a different configuration from 12/02/2020. Several additional smaller solid pulmonary nodules scattered in both lungs are not substantially changed from 12/02/2020 or 02/02/2021, for example measuring 1.0 cm in the medial right middle lobe (series 7/image 106) and 0.6 cm in the posterior left upper lobe (series 7/image 81). Upper abdomen: Cholecystectomy. Partially visualized retroperitoneal lymphadenopathy involving the left para-aortic and aortocaval chains, not substantially changed since 11/02/2020 CT abdomen/pelvis study, for example measuring 2.0 cm in the left periaortic chain (series 3/image 189) and 1.6 cm in the aortocaval chain (series 3/image 187). Partially visualized splenomegaly, unchanged. Musculoskeletal: No aggressive appearing focal osseous lesions. Mild thoracic spondylosis. IMPRESSION: 1. Spiculated solid 1.9 cm anterior left upper lobe pulmonary nodule, increased from 1.4 cm on 12/02/2020 CT, unchanged from 02/02/2021 CT, suspicious for primary bronchogenic carcinoma. 2. Medial central left upper lobe irregular solid 2.4 cm nodule, increased from 2.2 cm on 12/02/2020 CT, unchanged from 02/02/2021 CT. Left lower lobe 1.0 cm and 0.8 cm solid pulmonary nodules have both increased since 12/02/2020 CT, unchanged from 02/02/2021 CT. These findings are suspicious for metastatic disease and/or synchronous primary bronchogenic carcinomas. 3. Mild  subcarinal lymphadenopathy is new since 12/02/2020. Left hilar adenopathy is stable. Partially visualized retroperitoneal lymphadenopathy in the upper abdomen, not substantially changed since 11/02/2020 CT abdomen/pelvis study. Partially visualized splenomegaly. These findings are uncertain for mildly progressive lymphoproliferative condition versus metastatic  disease. 4. Trace left pleural effusion. 5. Patchy consolidation in the anterior and dependent basilar left lower lobe is minimally improved from 02/02/2021 and largely new/in a different configuration from 12/02/2020 CT, favoring improving recurrent pneumonia. 6. Three-vessel coronary atherosclerosis. 7. Aortic Atherosclerosis (ICD10-I70.0) and Emphysema (ICD10-J43.9). Electronically Signed   By: Ilona Sorrel M.D.   On: 02/04/2021 12:27   MR BRAIN W WO CONTRAST  Result Date: 02/03/2021 CLINICAL DATA:  History of lung cancer and lymphoma, presents with altered mental status EXAM: MRI HEAD WITHOUT AND WITH CONTRAST TECHNIQUE: Multiplanar, multiecho pulse sequences of the brain and surrounding structures were obtained without and with intravenous contrast. CONTRAST:  7.1mL GADAVIST GADOBUTROL 1 MMOL/ML IV SOLN COMPARISON:  CT head 02/01/2021, MR head 09/29/2020 FINDINGS: The coronal T1 postcontrast images are markedly motion degraded. Brain: There is no evidence of acute intracranial hemorrhage, extra-axial fluid collection, or acute infarct. There is mild global parenchymal volume loss with commensurate enlargement of the ventricular system. Patchy foci of FLAIR signal abnormality in the subcortical and periventricular white matter likely reflects sequela of mild chronic white matter microangiopathy. There is no mass lesion or abnormal enhancement, within the confines of motion degraded coronal T1 postcontrast images. There is no midline shift. Vascular: Normal flow voids. Skull and upper cervical spine: Normal marrow signal. Sinuses/Orbits: There is a mucous retention cyst in the left maxillary sinus. Globes and orbits are unremarkable. Other: None. IMPRESSION: No evidence of intracranial metastatic disease, though the coronal T1 postcontrast images are markedly motion degraded. Electronically Signed   By: Valetta Mole M.D.   On: 02/03/2021 20:59   MR LUMBAR SPINE WO CONTRAST  Result Date:  02/02/2021 CLINICAL DATA:  Lumbar radiculopathy, symptoms persist with > 6 wks treatment EXAM: MRI LUMBAR SPINE WITHOUT CONTRAST TECHNIQUE: Multiplanar, multisequence MR imaging of the lumbar spine was performed. No intravenous contrast was administered. COMPARISON:  CT of the lumbar spine 02/02/2021. MRI of the lumbar spine 04/12/2005. FINDINGS: The patient refused postcontrast imaging. Segmentation: Standard segmentation is assumed. Alignment:  Normal Vertebrae: Approximately 2 cm round lesion within the anterior/superior L3 vertebral body, which is T1 hypointense and T2/STIR intermediate in signal with surrounding STIR hyperintense edema. Approximately 10 mm L2 and 8 mm L1 lesions, which are T1 and T2 hypointense (series 3 and 4, image 9) without appreciable STIR hyperintensity. Conus medullaris and cauda equina: Conus extends to the T2 level level. Conus appears normal. Paraspinal and other soft tissues: Unremarkable. Disc levels: T12-L1: No significant disc protrusion, foraminal stenosis, or canal stenosis. L1-L2: No significant disc protrusion, foraminal stenosis, or canal stenosis. L2-L3: No significant disc protrusion, foraminal stenosis, or canal stenosis. L3-L4: Mild facet arthropathy and slight disc bulge without significant stenosis. L4-L5: Small broad disc bulge and mild bilateral facet arthropathy. No significant canal or foraminal stenosis. L5-S1: No significant disc protrusion, foraminal stenosis, or canal stenosis. IMPRESSION: 1. Approximately 2 cm round lesion within the anterior/superior L3 vertebral body, concerning for metastatic disease in this patient with known lung cancer. Surrounding marrow edema may be reactive or represent superimposed pathologic fracture. 2. Approximately 10 mm L2 and 8 mm L1 lesions are indeterminate. The patient refused contrast on this study. If the patient is willing, postcontrast imaging may be helpful to better evaluate. Alternatively, follow-up  MRI could assess  stability. 3. No significant canal or foraminal stenosis. Electronically Signed   By: Margaretha Sheffield M.D.   On: 02/02/2021 15:08   CT BIOPSY  Result Date: 02/04/2021 Criselda Peaches, MD     02/04/2021 12:37 PM Interventional Radiology Procedure Note Procedure: CT biopsy of L3 lesion Complications: None Estimated Blood Loss: None Recommendations: - Bedrest x 1 hr - Path sent Signed, Criselda Peaches, MD

## 2021-02-06 NOTE — Consult Note (Signed)
Cardiology Consultation:   Patient ID: Ryan Copeland MRN: [id number]; DOB: [birth year]  Admit date: 02/01/2021 Date of Consult: 02/06/2021  PCP:  [name]., PA-C   [facility] Providers Cardiologist:  Freada Bergeron, MD        Patient Profile:   Ryan Copeland is a 70 y.o. male with a hx of metastatic non-small cell lung cancer, lymphoma, pancytopenia, CAD status post LAD stent in 2011, PAD, T2DM, COPD  who is being seen 02/06/2021 for the evaluation of bradycardia at the request of Dr Candiss Norse.  History of Present Illness:   [name] is a 70 year old male with a history of metastatic non-small cell lung cancer, lymphoma, pancytopenia, CAD status post LAD stent in 2011, PAD, T2DM, COPD who presents with altered mental status and back pain who we are consulted to see for bradycardia.  CT and MRI of lumbar spine noted possible L2-L3 metastatic lesion.  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  Was noted today on telemetry to have rates dropping into 20s-30s.  He remains very confused, not following commands or able to answer questions about symptoms.  EKG 12/19 showed normal sinus rhythm, rate 74, Q waves in V1/2.  EKG today shows sinus bradycardia with rate 34.  Last echocardiogram 12/17/2019 showed EF 60 to 62%, grade 1 diastolic function, normal RV function, moderate left atrial enlargement, mild MR.  He has a history of CAD with NSTEMI in 2011 status post stent to LAD.  Cath in 2016 showed patent LAD stent, occluded RCA with left-to-right collaterals.  He follows with Dr. Johney Frame.  He is on midodrine for chronic hypotension.   Past Medical History:  Diagnosis Date   Anxiety 01/30/2014   Back injury    lower disc   CAD (coronary artery disease)    CLL (chronic lymphocytic leukemia) ([place]) 03/18/2011   COPD (chronic obstructive pulmonary disease) ([place])    DM type 2 (diabetes mellitus, type 2) ([facility])    ECRB  (extensor carpi radialis brevis) tenosynovitis    Foley catheter in place 11/2019   last changed 04-14-2020   GERD (gastroesophageal reflux disease)    takes Nexium if needed   History of blood transfusion 2015   Hyperlipidemia    Insomnia    Long-term current use of intravenous immunoglobulin (IVIG)    q month ivig   MI, acute, non ST segment elevation ([place]) 06/28/2009   with stenting of the LAD   Neuromuscular disorder ([place])    peripheral neuropathy both all toes   Pneumonia 11/2019   10/2020   PVD (peripheral vascular disease) ([place])    Thrombocytopenia ([place])    Tobacco abuse     Past Surgical History:  Procedure Laterality Date   ADENOIDECTOMY  1955   BRONCHIAL BIOPSY  12/08/2020   Procedure: BRONCHIAL BIOPSIES;  Surgeon: [name], DO;  Location: [place] [facility];  Service: Pulmonary;;   BRONCHIAL BRUSHINGS  12/08/2020   Procedure: BRONCHIAL BRUSHINGS;  Surgeon: [name], DO;  Location: [place] [facility];  Service: Pulmonary;;   BRONCHIAL NEEDLE ASPIRATION BIOPSY  12/08/2020   Procedure: BRONCHIAL NEEDLE ASPIRATION BIOPSIES;  Surgeon: [name], DO;  Location: [place];  Service: Pulmonary;;   CARDIAC CATHETERIZATION     CARDIAC CATHETERIZATION N/A 09/26/2014   Procedure: Left Heart Cath and Coronary Angiography;  Surgeon: Peter M Martinique, MD;  Location: [place] [facility];  Service: Cardiovascular;  Laterality: N/A;   carpel tunnel release Left  [id number]   carpel tunnel release  Right 01-1989   CHOLECYSTECTOMY  2007   [place]   CORONARY STENT PLACEMENT  06/2009   stent to lad   CYSTOSCOPY N/A 04/20/2020   Procedure: [place];  Surgeon: Janith Lima, MD;  Location: [facility];  Service: Urology;  Laterality: N/A;  ONLY NEEDS 30 MIN   femoral stents  09/2014   iliac stent   FIDUCIAL MARKER PLACEMENT  12/08/2020   Procedure: FIDUCIAL MARKER PLACEMENT;  Surgeon: [name], DO;  Location: [place] [facility];  Service:  Pulmonary;;   IR CV LINE INJECTION  08/18/2017   IR CV LINE INJECTION  09/01/2017   IR CV LINE INJECTION  02/02/2018   LATERAL EPICONDYLE RELEASE Left 02/12/2014   Procedure: LEFT ELBOW DEBRIDEMENT WITH TENDON REPAIR ;  Surgeon: Lorn Junes, MD;  Location: [place] [facility];  Service: Orthopedics;  Laterality: Left;   LEFT CAI STENT/PTA AND POPLITEAL ARTERY/TIBIAL THROMBECTOMY      LEFT HEART CATHETERIZATION WITH CORONARY ANGIOGRAM N/A 08/26/2011   Procedure: LEFT HEART CATHETERIZATION WITH CORONARY ANGIOGRAM;  Surgeon: Peter M Martinique, MD;  Location[facility];  Service: Cardiovascular;  Laterality: N/A;   PERIPHERAL VASCULAR CATHETERIZATION N/A 01/01/2015   Procedure: Abdominal Aortogram;  Surgeon: Conrad Aquasco, MD;  Location: [place] [facility];  Service: Cardiovascular;  Laterality: N/A;   port a cath insertion  2014   right    TARSAL TUNNEL RELEASE Bilateral 08-2007   VIDEO BRONCHOSCOPY WITH ENDOBRONCHIAL NAVIGATION Left 12/08/2020   Procedure: VIDEO BRONCHOSCOPY WITH ENDOBRONCHIAL NAVIGATION;  Surgeon: [name], DO;  Location: [place];  Service: Pulmonary;  Laterality: Left;  ION w/ fiducial placement   VIDEO BRONCHOSCOPY WITH RADIAL ENDOBRONCHIAL ULTRASOUND  12/08/2020   Procedure: RADIAL ENDOBRONCHIAL ULTRASOUND;  Surgeon: [name], DO;  Location: [facility];  Service: Pulmonary;;     Inpatient Medications: Scheduled Meds:  acyclovir  400 mg Oral BID   allopurinol  300 mg Oral Daily   ALPRAZolam  0.5 mg Oral BID   benzonatate  100 mg Oral TID   celecoxib  200 mg Oral BID   Chlorhexidine Gluconate Cloth  6 each Topical Daily   famotidine  20 mg Oral BID   heparin lock flush  500 Units Intracatheter Q30 days   HYDROcodone-acetaminophen  1 tablet Oral Q6H   rosuvastatin  5 mg Oral Daily   Continuous Infusions:  magnesium sulfate bolus IVPB 2 g (02/06/21 1352)   PRN Meds: acetaminophen, diphenoxylate-atropine, haloperidol lactate **OR** haloperidol lactate,  heparin lock flush **AND** heparin lock flush, HYDROmorphone (DILAUDID) injection, sodium chloride flush, zolpidem  Allergies:    Allergies  Allergen Reactions   Immune Globulin Hives and Rash    Pt reports hives/rash on body with IV Privigen. He was changed to IV Gamunex-C and had many infusions without adverse side effects.    Codeine Hives    Pt states he can take a few, more reaction with extended doses.    Social History:   Social History   Socioeconomic History   Marital status: Married    Spouse name: [name]   Number of children: 1   Years of education: [employer] education level: Not on file  Occupational History   Occupation: retired    Fish farm manager: [employer]    Comment: [employer]  Tobacco Use   Smoking status: Former    Packs/day: 1.00    Years: 44.00    Pack  years: 44.00    Types: Cigarettes    Quit date: 04/23/2016    Years since quitting: 4.7   Smokeless tobacco: Never  Vaping Use   Vaping Use: Never used  Substance and Sexual Activity   Alcohol use: Not Currently    Alcohol/week: 0.0 standard drinks   Drug use: No   Sexual activity: Yes  Other Topics Concern   Not on file  Social History Narrative   Patient lives at home with wife.   Caffeine Use: 15 cups of caffeine weekly   Social Determinants of Health   Financial Resource Strain: Not on file  Food Insecurity: Not on file  Transportation Needs: Not on file  Physical Activity: Not on file  Stress: Not on file  Social Connections: Not on file  Intimate Partner Violence: Not on file    Family History:    Family History  Problem Relation Age of Onset   Lung cancer Mother 77   Cancer Mother        lung   Heart failure Father 41   Heart disease Father      ROS:  Please see the history of present illness.   All other ROS reviewed and negative.     Physical Exam/Data:   Vitals:   02/06/21 0308 02/06/21 0735 02/06/21 1029 02/06/21 1417  BP: (!) 119/48 103/63 (!)  107/51 (!) 153/71  Pulse: (!) 47  (!) 42 (!) 33  Resp: 17  17 12   Temp: 98.5 F (36.9 C)  97.8 F (36.6 C)   TempSrc: Oral     SpO2: 98%  98% 95%    Intake/Output Summary (Last 24 hours) at 02/06/2021 1426 Last data filed at 02/06/2021 0800 Gross per 24 hour  Intake 1024.09 ml  Output 175 ml  Net 849.09 ml   Last 3 Weights 01/29/2021 01/13/2021 01/04/2021  Weight (lbs) 172 lb 12.8 oz 169 lb 6.4 oz 168 lb 9.6 oz  Weight (kg) 78.382 kg 76.839 kg 76.476 kg     There is no height or weight on file to calculate BMI.  General: Chronically ill-appearing HEENT: normal Neck: no JVD Cardiac: Marked bradycardia, no murmur Lungs:  clear to auscultation bilaterally Abd: soft, nontender, Ext: no edema Musculoskeletal:  No deformities Skin: warm and dry  Neuro: Not answering questions or following commands Psych: Unable to assess  EKG:  The EKG was personally reviewed and demonstrates: Sinus bradycardia with rate 34 Telemetry:  Telemetry was personally reviewed and demonstrates: Sinus bradycardia down to the 20s to 30s.  Also runs of ventricular escape rhythm in 40s  Relevant CV Studies:   Laboratory Data:  High Sensitivity Troponin:  No results for input(s): TROPONINIHS in the last 720 hours.   Chemistry Recent Labs  Lab 02/04/21 0252 02/05/21 0507 02/06/21 1003  NA 138 146* 143  K 3.3* 3.9 3.3*  CL 108 112* 112*  CO2 25 24 21*  GLUCOSE 93 56* 64*  BUN 13 14 14   CREATININE 0.85 1.01 0.95  CALCIUM 8.5* 9.0 9.0  MG 1.8 1.8 1.6*  GFRNONAA >60 >60 >60  ANIONGAP 5 10 10     Recent Labs  Lab 02/04/21 0252 02/05/21 0507 02/06/21 1003  PROT 5.5* 6.2* 5.8*  ALBUMIN 2.8* 3.2* 3.2*  AST 15 20 18   ALT 16 18 16   ALKPHOS 66 77 70  BILITOT 0.8 1.1 1.0   Lipids No results for input(s): CHOL, TRIG, HDL, LABVLDL, LDLCALC, CHOLHDL in the last 168 hours.  Hematology Recent Labs  Lab 02/04/21 0252 02/05/21 0507 02/06/21 1003  WBC 2.8* 2.6* 3.7*  RBC 3.98* 4.04* 4.41  HGB  11.2* 11.5* 12.6*  HCT 34.8* 36.4* 38.5*  MCV 87.4 90.1 87.3  MCH 28.1 28.5 28.6  MCHC 32.2 31.6 32.7  RDW 16.6* 16.7* 16.8*  PLT 48* 49* 56*   Thyroid No results for input(s): TSH, FREET4 in the last 168 hours.  BNP Recent Labs  Lab 02/04/21 0252 02/05/21 0507 02/06/21 1003  BNP 452.7* 405.2* 276.7*    DDimer No results for input(s): DDIMER in the last 168 hours.   Radiology/Studies:  CT CHEST W CONTRAST  Result Date: 02/04/2021 CLINICAL DATA:  Inpatient. Non-small cell lung cancers status post SBRT in December. Restaging. EXAM: CT CHEST WITH CONTRAST TECHNIQUE: Multidetector CT imaging of the chest was performed during intravenous contrast administration. CONTRAST:  76mL OMNIPAQUE IOHEXOL 350 MG/ML SOLN COMPARISON:  02/02/2021 and 12/02/2020 chest CT. FINDINGS: Cardiovascular: Top-normal heart size. No significant pericardial effusion/thickening. Three-vessel coronary atherosclerosis. Right internal jugular Port-A-Cath terminates at the cavoatrial junction. Atherosclerotic nonaneurysmal thoracic aorta. Top-normal caliber main pulmonary artery (3.2 cm diameter). No central pulmonary emboli. Mediastinum/Nodes: No discrete thyroid nodules. Unremarkable esophagus. No axillary adenopathy. Enlarged 1.3 cm subcarinal node (series 3/image 84), newly enlarged since 12/02/2020, unchanged from 02/02/2021. Enlarged 1.5 cm left hilar node (series 3/image 84), unchanged from 02/02/2021, unchanged from 12/02/2020. No right hilar adenopathy. Lungs/Pleura: No pneumothorax. Trace to left pleural effusion. No right pleural effusion. Mild centrilobular and paraseptal emphysema with mild diffuse bronchial wall thickening. Spiculated solid 1.9 x 1.7 cm anterior left upper lobe pulmonary nodule (series 7/image 88), increased from 1.4 x 1.2 cm on 12/02/2020 CT, unchanged from 02/02/2021 CT. Irregular solid medial central left upper lobe 2.4 x 1.7 cm nodule (series 7/image 81), mildly increased from 2.2 x 1.8 cm on  12/02/2020, unchanged from 02/02/2021. Solid 0.8 cm superior segment left lower lobe pulmonary nodule (series 7/image 76), increased from 0.4 cm on 12/02/2020, unchanged from 02/02/2021. Anterior left lower lobe 1.0 cm solid pulmonary nodule (series 7/image 125), increased from 0.4 cm on 12/02/2020, unchanged from 02/02/2021. Patchy consolidation in the anterior and dependent basilar left lower lobe is minimally improved from 02/02/2021 and largely new/in a different configuration from 12/02/2020. Several additional smaller solid pulmonary nodules scattered in both lungs are not substantially changed from 12/02/2020 or 02/02/2021, for example measuring 1.0 cm in the medial right middle lobe (series 7/image 106) and 0.6 cm in the posterior left upper lobe (series 7/image 81). Upper abdomen: Cholecystectomy. Partially visualized retroperitoneal lymphadenopathy involving the left para-aortic and aortocaval chains, not substantially changed since 11/02/2020 CT abdomen/pelvis study, for example measuring 2.0 cm in the left periaortic chain (series 3/image 189) and 1.6 cm in the aortocaval chain (series 3/image 187). Partially visualized splenomegaly, unchanged. Musculoskeletal: No aggressive appearing focal osseous lesions. Mild thoracic spondylosis. IMPRESSION: 1. Spiculated solid 1.9 cm anterior left upper lobe pulmonary nodule, increased from 1.4 cm on 12/02/2020 CT, unchanged from 02/02/2021 CT, suspicious for primary bronchogenic carcinoma. 2. Medial central left upper lobe irregular solid 2.4 cm nodule, increased from 2.2 cm on 12/02/2020 CT, unchanged from 02/02/2021 CT. Left lower lobe 1.0 cm and 0.8 cm solid pulmonary nodules have both increased since 12/02/2020 CT, unchanged from 02/02/2021 CT. These findings are suspicious for metastatic disease and/or synchronous primary bronchogenic carcinomas. 3. Mild subcarinal lymphadenopathy is new since 12/02/2020. Left hilar adenopathy is stable. Partially visualized  retroperitoneal lymphadenopathy in the upper abdomen, not substantially changed since 11/02/2020 CT abdomen/pelvis study.  Partially visualized splenomegaly. These findings are uncertain for mildly progressive lymphoproliferative condition versus metastatic disease. 4. Trace left pleural effusion. 5. Patchy consolidation in the anterior and dependent basilar left lower lobe is minimally improved from 02/02/2021 and largely new/in a different configuration from 12/02/2020 CT, favoring improving recurrent pneumonia. 6. Three-vessel coronary atherosclerosis. 7. Aortic Atherosclerosis (ICD10-I70.0) and Emphysema (ICD10-J43.9). Electronically Signed   By: Ilona Sorrel M.D.   On: 02/04/2021 12:27   MR BRAIN W WO CONTRAST  Result Date: 02/03/2021 CLINICAL DATA:  History of lung cancer and lymphoma, presents with altered mental status EXAM: MRI HEAD WITHOUT AND WITH CONTRAST TECHNIQUE: Multiplanar, multiecho pulse sequences of the brain and surrounding structures were obtained without and with intravenous contrast. CONTRAST:  7.28mL GADAVIST GADOBUTROL 1 MMOL/ML IV SOLN COMPARISON:  CT head 02/01/2021, MR head 09/29/2020 FINDINGS: The coronal T1 postcontrast images are markedly motion degraded. Brain: There is no evidence of acute intracranial hemorrhage, extra-axial fluid collection, or acute infarct. There is mild global parenchymal volume loss with commensurate enlargement of the ventricular system. Patchy foci of FLAIR signal abnormality in the subcortical and periventricular white matter likely reflects sequela of mild chronic white matter microangiopathy. There is no mass lesion or abnormal enhancement, within the confines of motion degraded coronal T1 postcontrast images. There is no midline shift. Vascular: Normal flow voids. Skull and upper cervical spine: Normal marrow signal. Sinuses/Orbits: There is a mucous retention cyst in the left maxillary sinus. Globes and orbits are unremarkable. Other: None.  IMPRESSION: No evidence of intracranial metastatic disease, though the coronal T1 postcontrast images are markedly motion degraded. Electronically Signed   By: Valetta Mole M.D.   On: 02/03/2021 20:59   MR LUMBAR SPINE WO CONTRAST  Result Date: 02/02/2021 CLINICAL DATA:  Lumbar radiculopathy, symptoms persist with > 6 wks treatment EXAM: MRI LUMBAR SPINE WITHOUT CONTRAST TECHNIQUE: Multiplanar, multisequence MR imaging of the lumbar spine was performed. No intravenous contrast was administered. COMPARISON:  CT of the lumbar spine 02/02/2021. MRI of the lumbar spine 04/12/2005. FINDINGS: The patient refused postcontrast imaging. Segmentation: Standard segmentation is assumed. Alignment:  Normal Vertebrae: Approximately 2 cm round lesion within the anterior/superior L3 vertebral body, which is T1 hypointense and T2/STIR intermediate in signal with surrounding STIR hyperintense edema. Approximately 10 mm L2 and 8 mm L1 lesions, which are T1 and T2 hypointense (series 3 and 4, image 9) without appreciable STIR hyperintensity. Conus medullaris and cauda equina: Conus extends to the T2 level level. Conus appears normal. Paraspinal and other soft tissues: Unremarkable. Disc levels: T12-L1: No significant disc protrusion, foraminal stenosis, or canal stenosis. L1-L2: No significant disc protrusion, foraminal stenosis, or canal stenosis. L2-L3: No significant disc protrusion, foraminal stenosis, or canal stenosis. L3-L4: Mild facet arthropathy and slight disc bulge without significant stenosis. L4-L5: Small broad disc bulge and mild bilateral facet arthropathy. No significant canal or foraminal stenosis. L5-S1: No significant disc protrusion, foraminal stenosis, or canal stenosis. IMPRESSION: 1. Approximately 2 cm round lesion within the anterior/superior L3 vertebral body, concerning for metastatic disease in this patient with known lung cancer. Surrounding marrow edema may be reactive or represent superimposed  pathologic fracture. 2. Approximately 10 mm L2 and 8 mm L1 lesions are indeterminate. The patient refused contrast on this study. If the patient is willing, postcontrast imaging may be helpful to better evaluate. Alternatively, follow-up MRI could assess stability. 3. No significant canal or foraminal stenosis. Electronically Signed   By: Margaretha Sheffield M.D.   On: 02/02/2021 15:08  CT BIOPSY  Result Date: 02/04/2021 Criselda Peaches, MD     02/04/2021 12:37 PM Interventional Radiology Procedure Note Procedure: CT biopsy of L3 lesion Complications: None Estimated Blood Loss: None Recommendations: - Bedrest x 1 hr - Path sent Signed, Criselda Peaches, MD     Assessment and Plan:    Sinus bradycardia: Marked bradycardia down to 20s to 30s while sleeping, improves to 30s to 40s while awake.  Also with runs of ventricular escape rhythm in 40s.   -Midodrine could be contributing, agree with discontinuing.   -Recent head CT/brain MRI did not show evidence of bleeding, but given onset of profound bradycardia and his thrombocytopenia, recommend repeating head CT to rule out bleed -Agree with checking TSH -Echocardiogram -Appears hemodynamically stable.  He is not a pacemaker candidate given his metastatic cancer.  If becoming hypotensive, would recommend transferring to ICU and starting dopamine, with ongoing goals of care discussions    For questions or updates, please contact [place] Please consult [email or web address] for contact info under    Signed, Donato Heinz, MD  02/06/2021 2:26 PM

## 2021-02-07 ENCOUNTER — Inpatient Hospital Stay (HOSPITAL_COMMUNITY): Payer: Medicare Other

## 2021-02-07 DIAGNOSIS — R451 Restlessness and agitation: Secondary | ICD-10-CM

## 2021-02-07 DIAGNOSIS — Z789 Other specified health status: Secondary | ICD-10-CM

## 2021-02-07 DIAGNOSIS — Z66 Do not resuscitate: Secondary | ICD-10-CM

## 2021-02-07 DIAGNOSIS — C7951 Secondary malignant neoplasm of bone: Principal | ICD-10-CM

## 2021-02-07 MED ORDER — ACETAMINOPHEN 650 MG RE SUPP: 650.0000 mg | Freq: Four times a day (QID) | RECTAL | Status: DC | PRN

## 2021-02-07 MED ORDER — LORAZEPAM 2 MG/ML PO CONC
1.0000 mg | ORAL | Status: DC | PRN
  Filled 2021-02-07: qty 0.5

## 2021-02-07 MED ORDER — GUAIFENESIN 100 MG/5ML PO LIQD
5.0000 mL | Freq: Three times a day (TID) | ORAL | Status: DC
Start: 2021-02-07 — End: 2021-02-08
  Administered 2021-02-07 – 2021-02-08 (×2): 5 mL via ORAL
  Filled 2021-02-07 (×3): qty 5

## 2021-02-07 MED ORDER — LORAZEPAM 2 MG/ML IJ SOLN: 1.0000 mg | INTRAMUSCULAR | Status: DC | PRN

## 2021-02-07 MED ORDER — HALOPERIDOL LACTATE 5 MG/ML IJ SOLN
2.0000 mg | Freq: Four times a day (QID) | INTRAMUSCULAR | Status: DC | PRN
  Administered 2021-02-08 (×2): 2 mg via INTRAVENOUS
  Filled 2021-02-07 (×2): qty 1

## 2021-02-07 MED ORDER — HALOPERIDOL LACTATE 5 MG/ML IJ SOLN
2.0000 mg | Freq: Four times a day (QID) | INTRAMUSCULAR | Status: DC | PRN
  Filled 2021-02-07: qty 1

## 2021-02-07 MED ORDER — LORAZEPAM 1 MG PO TABS: 1.0000 mg | ORAL_TABLET | ORAL | Status: DC | PRN

## 2021-02-07 MED ORDER — HYDROCODONE-ACETAMINOPHEN 7.5-325 MG/15ML PO SOLN
15.0000 mL | Freq: Four times a day (QID) | ORAL | Status: DC
  Administered 2021-02-07 – 2021-02-08 (×4): 15 mL via ORAL
  Filled 2021-02-07 (×4): qty 15

## 2021-02-07 MED ORDER — ACETAMINOPHEN 325 MG PO TABS: 650.0000 mg | ORAL_TABLET | Freq: Four times a day (QID) | ORAL | Status: DC | PRN

## 2021-02-07 MED ORDER — LORAZEPAM 2 MG/ML IJ SOLN
1.0000 mg | Freq: Four times a day (QID) | INTRAMUSCULAR | Status: DC
  Administered 2021-02-07 – 2021-02-08 (×4): 1 mg via INTRAVENOUS
  Filled 2021-02-07 (×4): qty 1

## 2021-02-07 MED ORDER — DIPHENHYDRAMINE HCL 12.5 MG/5ML PO ELIX
25.0000 mg | ORAL_SOLUTION | Freq: Four times a day (QID) | ORAL | Status: DC | PRN
  Administered 2021-02-08: 12.5 mg via ORAL
  Filled 2021-02-07: qty 10

## 2021-02-07 NOTE — Progress Notes (Addendum)
Daily Progress Note   Patient Name: Ryan Copeland       Date: 02/07/2021 DOB: August 22, 1950  Age: 70 y.o. MRN#: [id number] Attending Physician: Thurnell Lose, MD Primary Care Physician: [name]., PA-C Admit Date: 02/01/2021  Reason for Consultation/Follow-up: Non pain symptom management, Pain control, Psychosocial/spiritual support, and Terminal Care  Subjective: Chart review performed. Received report from primary RN - no acute concerns. RN states patient remains confused and is having a hard time taking pills, requests meds that can be adjusted to IV or liquid be switched.  Went to visit patient at bedside. No family/visitors at bedside. He is lying in bed confused and intermittently awake/asleep. When awake he seems restless - weakly tries to get out of bed - does not open his eyes. No signs or non-verbal gestures of pain or discomfort noted. No respiratory distress, increased work of breathing, or secretions noted.   Length of Stay: 5  Current Medications: Scheduled Meds:   acyclovir  400 mg Oral BID   allopurinol  300 mg Oral Daily   ALPRAZolam  0.5 mg Oral BID   benzonatate  100 mg Oral TID   celecoxib  200 mg Oral BID   Chlorhexidine Gluconate Cloth  6 each Topical Daily   famotidine  20 mg Oral BID   heparin lock flush  500 Units Intracatheter Q30 days   HYDROcodone-acetaminophen  1 tablet Oral Q6H    Continuous Infusions:   PRN Meds: acetaminophen, antiseptic oral rinse, diphenoxylate-atropine, glycopyrrolate **OR** glycopyrrolate **OR** glycopyrrolate, haloperidol lactate **OR** haloperidol lactate, heparin lock flush **AND** heparin lock flush, HYDROmorphone (DILAUDID) injection, LORazepam **OR** LORazepam **OR** LORazepam, ondansetron **OR** ondansetron  (ZOFRAN) IV, polyvinyl alcohol, sodium chloride flush, zolpidem  Physical Exam Vitals and nursing note reviewed.  Constitutional:      General: He is not in acute distress.    Appearance: He is ill-appearing.  Pulmonary:     Effort: No respiratory distress.  Skin:    General: Skin is warm and dry.  Neurological:     Mental Status: He is lethargic, disoriented and confused.     Motor: Weakness present.  Psychiatric:        Mood and Affect: Mood is anxious.        Speech: Speech is slurred.        Behavior:  Behavior is slowed.        Cognition and Memory: Cognition is impaired. Memory is impaired.        Judgment: Judgment is impulsive.            Vital Signs: BP (!) 141/59    Pulse (!) 32    Temp 97.8 F (36.6 C)    Resp 18    SpO2 97%  SpO2: SpO2: 97 % O2 Device: O2 Device: Room Air O2 Flow Rate: O2 Flow Rate (L/min): 2 L/min  Intake/output summary:  Intake/Output Summary (Last 24 hours) at 02/07/2021 1315 Last data filed at 02/06/2021 1700 Gross per 24 hour  Intake 0 ml  Output --  Net 0 ml   LBM: Last BM Date: 02/04/21 Baseline Weight:   Most recent weight:         Palliative Assessment/Data: PPS 20%      Patient Active Problem List   Diagnosis Date Noted   Sinus bradycardia    Indwelling catheter present on admission 02/02/2021   AMS (altered mental status) 02/01/2021   Aortic root dilation ([place]) 01/03/2021   Bilateral leg edema 10/27/2020   Physical debility 10/27/2020   Lung nodule 09/23/2020   Non-small cell carcinoma of lung, left ([place]) 09/18/2020   Head and neck lymphadenopathy 59/16/3846   Complicated UTI (urinary tract infection) 07/18/2020   Gum lesion 05/29/2020   Acute lower UTI 02/16/2020   Bladder pain 02/16/2020   Chronic hypotension 02/16/2020   Vitamin B12 deficiency 01/01/2020   Hypotension due to drugs 01/01/2020   Protein-calorie malnutrition, moderate ([place]) 01/01/2020   Septic shock ([place]) 12/15/2019   Community acquired  pneumonia 11/28/2019   Cerebral vascular disease 11/25/2019   CIDP (chronic inflammatory demyelinating polyneuropathy) ([place]) 11/25/2019   Other fatigue 10/25/2019   Deficiency anemia 10/25/2019   Dizziness 10/22/2019   Gait abnormality 10/22/2019   Hx of nonmelanoma skin cancer 03/15/2019   Essential hypertension 04/24/2018   Influenza A 04/17/2018   Sepsis ([place]) 04/17/2018   Chronic diastolic CHF (congestive heart failure) ([place]) 04/17/2018   Skin rash 08/07/2017   Port-A-Cath in place 08/04/2017   Chronic apical periodontitis 04/26/2017   Retained dental roots 04/26/2017   Dental caries 04/26/2017   Chronic periodontitis 04/26/2017   Loose, teeth 04/26/2017   Peripheral polyneuropathy 04/14/2017   COPD mixed type ([place]) 02/23/2017   Chronic sinusitis 01/27/2017   Splenomegaly, congestive, chronic 12/29/2016   Diarrhea 12/01/2016   Poor dentition 12/01/2016   Benign mole 12/01/2016   Pancytopenia, acquired ([place]) 11/03/2016   Odynophagia 01/15/2016   Polycythemia, secondary 06/26/2015   Quality of life palliative care encounter 06/26/2015   Atherosclerosis of extremity with intermittent claudication ([place]) 01/23/2015   Lateral epicondylitis of left elbow    ECRB (extensor carpi radialis brevis) tenosynovitis    Anxiety 01/30/2014   Preoperative clearance 01/30/2014   Thrombocytopenia ([place]) 01/30/2014   Diabetes mellitus without complication ([place]) 65/99/3570   Hypogammaglobulinemia, acquired ([place]) 12/26/2012   Hereditary and idiopathic peripheral neuropathy 08/20/2012   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 Wife reported he has also been unsteady on his feet and had 2 unwitnessed falls in the past 3 days. CT of the head was negative. Chest x-ray showed possible left lung base pneumonia. Admitted to [facility] with acute metabolic encephalopathy.     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 End of life care  Recommendations/Plan: Continue full comfort measures Continue DNR/DNI as previously documented  Patient is residential hospice appropriate if family agreeable Minimized medications to focus on comfort - patient having a hard time swallowing pills - adjusted meds to IV and liquid where appropriate Adjust Haldol IV to q6hr PRN agitation  Adjusted Ativan IV/PO/Sublingual to q1hr PRN anxiety  Discontinued scheduled Xanax PO q6h, replaced with scheduled ativan IV 1mg  q6h  Added benadryl IV 25mg  q6h PRN itching Discontinued tessalon PO TID, replaced with robitussin 45ml TID Discontinued Pepcid  Switched hydrocodone-acetaminophen PO q6h to liquid 85ml q6h Unrestricted visitation orders were placed per current [facility] visitation policy  Nursing to provide frequent assessments and administer PRN medications as clinically necessary to ensure EOL comfort PMT will continue to follow and support holistically   Goals of Care and Additional Recommendations: Limitations on Scope of Treatment: Full Comfort Care  Code Status:    Code Status Orders  (From admission, onward)           Start     Ordered   02/06/21 1655  Do not attempt resuscitation (DNR)  Continuous       Question Answer Comment  In the event of cardiac or respiratory ARREST Do not call a code blue   In the  event of cardiac or respiratory ARREST Do not perform Intubation, CPR, defibrillation or ACLS   In the event of cardiac or respiratory ARREST Use medication by any route, position, wound care, and other measures to relive pain and suffering. May use oxygen, suction and manual treatment of airway obstruction as needed for comfort.      02/06/21 1654           Code Status History     Date Active Date Inactive Code Status Order ID Comments User Context   02/01/2021 2047 02/06/2021 1654 DNR [id number]  Ileene Musa T, DO ED   02/01/2021 2045 02/01/2021 2047 DNR [id number]  Orene Desanctis, DO ED   11/02/2020 1713 11/06/2020 2115 Full Code [id number]  Jacky Kindle, MD ED   07/18/2020 1111 07/20/2020 2236 DNR [id number]  Karmen Bongo, MD Inpatient   02/15/2020 0931 02/20/2020 2056 Full Code [id number]  Cristal Generous, NP ED   12/15/2019 1135 12/20/2019 1741 Full Code [id number]  Erick Colace, NP ED   11/28/2019 1846 12/05/2019 1813 Full Code [id number]  [place] Eric J, DO ED   04/18/2018 0111 04/21/2018 1524 Full Code [id number]  Ivor Costa, MD ED   01/01/2015 1056 01/01/2015 1919 Full Code [id number]  Conrad Apollo, MD Inpatient   09/26/2014 0927 09/26/2014 1527 Full Code [id number]  [place], Peter M, MD Inpatient       Prognosis:  < 2 weeks  Discharge Planning: To Be Determined  Care plan was discussed with primary RN   Thank you for allowing the Palliative Medicine Team to assist in the care of this patient.   Total Time 35 minutes Prolonged Time  Billed  no       Greater than 50%  of this time was spent counseling and coordinating care related to the above assessment and plan.  Lin Landsman, NP  Please contact Palliative Medicine Team phone at [phone] for questions and concerns.

## 2021-02-07 NOTE — Plan of Care (Signed)
°  Problem: Skin Integrity: Goal: Risk for impaired skin integrity will decrease Outcome: Progressing   Problem: Nutrition: Goal: Adequate nutrition will be maintained Outcome: Not Progressing

## 2021-02-07 NOTE — Progress Notes (Signed)
PROGRESS NOTE                                                                                                                                                                                                             Patient Demographics:    Ryan Copeland, is a 70 y.o. male, DOB - 1951-01-19, UJW:[id number]  Outpatient Primary MD for the patient is [name]., PA-C    LOS - 5  Admit date - 02/01/2021    Chief Complaint  Patient presents with   Altered Mental Status       Brief Narrative (HPI from H&P)   Ryan Copeland is a 70 y.o. 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   Subjective:   Patient in bed, appears comfortable, denies any headache, no fever, no chest pain or pressure, no shortness of breath , no abdominal pain. No new focal weakness. + back pain, ++ confused.   Assessment  & Plan :    Patient is now full comfort measures.  All known comfort medications have been stopped.  He is DNR.  Likely disposition to hospice soon    Other medical problems actively addressed earlier this admission are below.     Toxic encephalopathy in a patient with non-small cell cancer of the lung with now possible L3 metastatic lesion, chronic indwelling Foley catheter  - he is currently headache free with no neck stiffness or photophobia, head CT unremarkable.  Most likely source of his encephalopathy is ongoing lower back pain with lack of good quality sleep and being on narcotics for the back pain.  MRI brain nonacute no focal deficits.  With supportive care, pain control, improved sleep mentation has  improved somewhat  Ongoing lower back pain for the last 1 week.  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  Palliative radiation treatments  as outpatient if desired..  Non-small cell left lung cancer now possible mets to the L-spine ? Mild meningeal spread.  Recently completed radiation treatment.  Under the care of Dr. Valeta Harms pulmonary and Dr. Lisbeth Renshaw radiation oncology, CT scan noted, biopsy results prelim suggestive of metastatic lesion to the L-spine.  Discussed with Dr. Valeta Harms, will follow in the office.  Prognosis does not appear excellent.  Small cell B-cell lymphoma (CLL) - under the care of Dr. Alvy Bimler Case discussed with her, hold [place] in the acute setting, defer management to oncology.  Questionable pneumonia on admission.  Ruled out clinically.  I do not think he has active pneumonia have titrated off antibiotics, cultures have remained negative.  Hypogammaglobinemia requiring monthly IVIG treatment.  Supportive care.  Chronic indwelling suprapubic catheter.  Catheter changed on 02/01/2021.  Monitor.  UA appears stable.  History of gout.  Continue acyclovir.  Dyslipidemia.  On statin.   History of CAD with LAD stent.  Aortic root dilation.  Blood pressure low hence no beta-blocker, severe baseline thrombocytopenia hence no aspirin.  Continue statin for supportive care with outpatient PCP and cardiology follow-up  Hypokalemia.  Replaced.    Chronic moderate to severe thrombocytopenia.  Monitor.  Lab Results  Component Value Date   PLT 56 (L) 02/06/2021    Bradycardia - asymptomatic, Rate drops into 40s, upon sleeping at 2pm - low 20s, ? Home medicine Midodrene - stop, check TSH and EKG, no  other potential bradycardic agents, poor pacemaker candidate, Cards input x 1, ? Short term Isoproterenol - BP stable.       Condition - Extremely Guarded  Family Communication  : Wife [name] [phone] on 02/02/2021, 02/03/2021 x 2, 02/04/21, called wife twice on 02/05/2021/left at 10:45 AM, called again at 66 AM no response, 02/06/21  Code Status :  DNR  Consults  :  Oncology, radiation oncology, IR, pulmonary over the phone  PUD Prophylaxis :  Pepcid   Procedures  :     L 3 Biopsy 02/04/21  MRI Brain - non acute  CT Chest - 1. Clear progression of the left upper lobe pulmonary nodule, now measuring 1.6 cm in the same dimension that was previously measured at 1.2 cm. 2. Interval progression of patchy and nodular airspace disease in the left lower lobe with associated volume loss. Imaging features likely related to infectious/inflammatory etiology. 3. Aortic Atherosclerosis  MRI L Spine -  1. Approximately 2 cm round lesion within the anterior/superior L3 vertebral body, concerning for metastatic disease in this patient with known lung cancer. Surrounding marrow edema may be reactive or represent superimposed pathologic fracture. 2. Approximately 10 mm L2 and 8 mm L1 lesions are indeterminate. The patient refused contrast on this study. If the patient is willing, postcontrast imaging may be helpful to better evaluate. Alternatively, follow-up MRI could assess stability. 3. No significant canal or foraminal stenosis.      Disposition Plan  :    Status is: Inpt  DVT Prophylaxis  :        Lab Results  Component Value Date   PLT 56 (L) 02/06/2021    Diet :  Diet Order             Diet regular Room service appropriate? Yes; Fluid consistency: Thin  Diet effective now                    Inpatient Medications  Scheduled Meds:  acyclovir  400 mg Oral BID   allopurinol  300 mg Oral Daily   ALPRAZolam  0.5 mg Oral BID   benzonatate  100 mg Oral TID   celecoxib   200 mg Oral BID   Chlorhexidine Gluconate Cloth  6 each Topical Daily   famotidine  20 mg Oral BID   heparin lock flush  500 Units Intracatheter Q30 days   HYDROcodone-acetaminophen  1 tablet Oral Q6H   Continuous Infusions:   PRN Meds:.acetaminophen, antiseptic oral rinse, diphenoxylate-atropine, glycopyrrolate **OR** glycopyrrolate **OR** glycopyrrolate, haloperidol lactate **OR** haloperidol lactate, heparin lock flush **AND** heparin lock flush, HYDROmorphone (DILAUDID) injection, LORazepam **OR** LORazepam **OR** LORazepam, ondansetron **OR** ondansetron (ZOFRAN) IV, polyvinyl alcohol, sodium chloride flush, zolpidem  Antibiotics  :    Anti-infectives (From admission, onward)    Start     Dose/Rate Route Frequency Ordered Stop   02/02/21 1000  vancomycin (VANCOCIN) IVPB 1000 mg/200 mL premix  Status:  Discontinued        1,000 mg 200 mL/hr over 60 Minutes Intravenous Every 12 hours 02/02/21 0008 02/04/21 0720   02/02/21 0200  ceFEPIme (MAXIPIME) 2 g in sodium chloride 0.9 % 100 mL IVPB  Status:  Discontinued        2 g 200 mL/hr over 30 Minutes Intravenous Every 8 hours 02/02/21 0008 02/05/21 1626   02/01/21 2200  acyclovir (ZOVIRAX) tablet 400 mg        400 mg Oral 2 times daily 02/01/21 2108     02/01/21 1745  vancomycin (VANCOREADY) IVPB 1750 mg/350 mL        1,750 mg 175 mL/hr over 120 Minutes Intravenous  Once 02/01/21 1733 02/01/21 2255   02/01/21 1745  piperacillin-tazobactam (ZOSYN) IVPB 3.375 g        3.375 g 12.5 mL/hr over 240 Minutes Intravenous  Once 02/01/21 1733 02/01/21 2255        Time Spent in minutes  30   Lala Lund M.D on 02/07/2021 at 11:27 AM  To page go to [email or web address]   [facility] -  Office  [phone]  See all Orders from today for further details    Objective:   Vitals:   02/06/21 1029 02/06/21 1417 02/06/21 1724 02/06/21 2024  BP: (!) 107/51 (!) 153/71  (!) 141/59  Pulse: (!) 42 (!) 33 (!) 27 (!) 32  Resp: 17 12  18    Temp: 97.8 F (36.6 C)     TempSrc:      SpO2: 98% 95% 100% 97%    Wt Readings from Last 3 Encounters:  01/29/21 78.4 kg  01/13/21 76.8 kg  01/04/21 76.5 kg     Intake/Output Summary (Last 24 hours) at 02/07/2021 1127 Last data filed at 02/06/2021 1700 Gross per 24 hour  Intake 0 ml  Output --  Net 0 ml     Physical Exam  Awake Alert x2, No new F.N deficits,   [place].AT,PERRAL Supple Neck, No JVD,   Symmetrical Chest wall movement, Good air movement bilaterally, CTAB RRR,No Gallops, Rubs or new Murmurs,  +ve B.Sounds, Abd Soft, No tenderness,   No Cyanosis, suprapubic catheter      Data Review:    CBC Recent Labs  Lab 02/01/21 1617 02/02/21 0427 02/03/21 0439 02/04/21 0252 02/05/21 0507 02/06/21 1003  WBC 9.7 10.1 3.9* 2.8* 2.6* 3.7*  HGB 12.0* 11.6* 10.9* 11.2* 11.5* 12.6*  HCT 38.4* 36.3* 34.3* 34.8* 36.4* 38.5*  PLT 66* 54* 55* 48* 49* 56*  MCV 89.7 89.9 89.3 87.4 90.1 87.3  MCH 28.0 28.7 28.4 28.1 28.5 28.6  MCHC 31.3 32.0 31.8 32.2 31.6 32.7  RDW 17.1* 17.2* 17.0* 16.6* 16.7* 16.8*  LYMPHSABS 6.7*  --  1.8 0.7 0.5* 0.3*  MONOABS 0.6  --  0.4 0.4 0.4 0.8  EOSABS 0.0  --  0.0 0.1 0.1 0.1  BASOSABS 0.0  --  0.0 0.0 0.0 0.0    Electrolytes Recent Labs  Lab 02/01/21 1617 02/01/21 1618 02/02/21 0427 02/02/21 2137 02/03/21 0439 02/04/21 0252 02/05/21 0507 02/06/21 1003  NA 134*  --  136  --  139 138 146* 143  K 4.1  --  5.7*  --  3.7 3.3* 3.9 3.3*  CL 100  --  105  --  107 108 112* 112*  CO2 26  --  25  --  27 25 24  21*  GLUCOSE 115*  --  80  --  113* 93 56* 64*  BUN 14  --  14  --  16 13 14 14   CREATININE 0.98  --  0.87  --  0.82 0.85 1.01 0.95  CALCIUM 8.9  --  8.5*  --  8.8* 8.5* 9.0 9.0  AST 17  --   --   --  13* 15 20 18   ALT 25  --   --   --  17 16 18 16   ALKPHOS 86  --   --   --  71 66 77 70  BILITOT 0.3  --   --   --  0.6 0.8 1.1 1.0  ALBUMIN 3.6  --   --   --  2.7* 2.8* 3.2* 3.2*  MG  --   --   --  2.0 1.7 1.8 1.8 1.6*  CRP  --    --   --  3.7* 2.5* 0.8 0.7 0.8  PROCALCITON  --   --   --  <0.10 43.03 <0.10 <0.10 0.12  LATICACIDVEN  --  1.2  --   --   --   --   --   --   TSH  --   --   --   --   --   --   --  5.770*  AMMONIA 14  --   --   --   --   --   --   --   BNP  --   --   --  96.8 114.3* 452.7* 405.2* 276.7*    ------------------------------------------------------------------------------------------------------------------ No results for input(s): CHOL, HDL, LDLCALC, TRIG, CHOLHDL, LDLDIRECT in the last 72 hours.  Lab Results  Component Value Date   HGBA1C 4.6 (L) 11/02/2020    Recent Labs    02/06/21 1003  TSH 5.770*   ------------------------------------------------------------------------------------------------------------------ ID Labs Recent Labs  Lab 02/01/21 1618 02/02/21 0427 02/02/21 2137 02/03/21 0439 02/04/21 0252 02/05/21 0507 02/06/21 1003  WBC  --  10.1  --  3.9* 2.8* 2.6* 3.7*  PLT  --  54*  --  55* 48* 49* 56*  CRP  --   --  3.7* 2.5* 0.8 0.7 0.8  PROCALCITON  --   --  <0.10 43.03 <0.10 <0.10 0.12  LATICACIDVEN 1.2  --   --   --   --   --   --   CREATININE  --  0.87  --  0.82 0.85 1.01 0.95   Cardiac Enzymes No results for input(s): CKMB, TROPONINI, MYOGLOBIN in the last 168 hours.  Invalid input(s): CK  Radiology Reports CT HEAD WO CONTRAST (5MM)  Result Date: 02/06/2021 CLINICAL DATA:  Delirium EXAM: CT  HEAD WITHOUT CONTRAST TECHNIQUE: Contiguous axial images were obtained from the base of the skull through the vertex without intravenous contrast. COMPARISON:  MR brain, 02/03/2021 FINDINGS: Brain: No evidence of acute infarction, hemorrhage, hydrocephalus, extra-axial collection or mass lesion/mass effect. Mild periventricular white matter hypodensity. Vascular: No hyperdense vessel or unexpected calcification. Skull: Normal. Negative for fracture or focal lesion. Sinuses/Orbits: No acute finding. Other: None. IMPRESSION: No acute intracranial pathology. Mild  small-vessel white matter disease. Electronically Signed   By: Delanna Ahmadi M.D.   On: 02/06/2021 16:47   CT CHEST W CONTRAST  Result Date: 02/04/2021 CLINICAL DATA:  Inpatient. Non-small cell lung cancers status post SBRT in December. Restaging. EXAM: CT CHEST WITH CONTRAST TECHNIQUE: Multidetector CT imaging of the chest was performed during intravenous contrast administration. CONTRAST:  25mL OMNIPAQUE IOHEXOL 350 MG/ML SOLN COMPARISON:  02/02/2021 and 12/02/2020 chest CT. FINDINGS: Cardiovascular: Top-normal heart size. No significant pericardial effusion/thickening. Three-vessel coronary atherosclerosis. Right internal jugular Port-A-Cath terminates at the cavoatrial junction. Atherosclerotic nonaneurysmal thoracic aorta. Top-normal caliber main pulmonary artery (3.2 cm diameter). No central pulmonary emboli. Mediastinum/Nodes: No discrete thyroid nodules. Unremarkable esophagus. No axillary adenopathy. Enlarged 1.3 cm subcarinal node (series 3/image 84), newly enlarged since 12/02/2020, unchanged from 02/02/2021. Enlarged 1.5 cm left hilar node (series 3/image 84), unchanged from 02/02/2021, unchanged from 12/02/2020. No right hilar adenopathy. Lungs/Pleura: No pneumothorax. Trace to left pleural effusion. No right pleural effusion. Mild centrilobular and paraseptal emphysema with mild diffuse bronchial wall thickening. Spiculated solid 1.9 x 1.7 cm anterior left upper lobe pulmonary nodule (series 7/image 88), increased from 1.4 x 1.2 cm on 12/02/2020 CT, unchanged from 02/02/2021 CT. Irregular solid medial central left upper lobe 2.4 x 1.7 cm nodule (series 7/image 81), mildly increased from 2.2 x 1.8 cm on 12/02/2020, unchanged from 02/02/2021. Solid 0.8 cm superior segment left lower lobe pulmonary nodule (series 7/image 76), increased from 0.4 cm on 12/02/2020, unchanged from 02/02/2021. Anterior left lower lobe 1.0 cm solid pulmonary nodule (series 7/image 125), increased from 0.4 cm on 12/02/2020,  unchanged from 02/02/2021. Patchy consolidation in the anterior and dependent basilar left lower lobe is minimally improved from 02/02/2021 and largely new/in a different configuration from 12/02/2020. Several additional smaller solid pulmonary nodules scattered in both lungs are not substantially changed from 12/02/2020 or 02/02/2021, for example measuring 1.0 cm in the medial right middle lobe (series 7/image 106) and 0.6 cm in the posterior left upper lobe (series 7/image 81). Upper abdomen: Cholecystectomy. Partially visualized retroperitoneal lymphadenopathy involving the left para-aortic and aortocaval chains, not substantially changed since 11/02/2020 CT abdomen/pelvis study, for example measuring 2.0 cm in the left periaortic chain (series 3/image 189) and 1.6 cm in the aortocaval chain (series 3/image 187). Partially visualized splenomegaly, unchanged. Musculoskeletal: No aggressive appearing focal osseous lesions. Mild thoracic spondylosis. IMPRESSION: 1. Spiculated solid 1.9 cm anterior left upper lobe pulmonary nodule, increased from 1.4 cm on 12/02/2020 CT, unchanged from 02/02/2021 CT, suspicious for primary bronchogenic carcinoma. 2. Medial central left upper lobe irregular solid 2.4 cm nodule, increased from 2.2 cm on 12/02/2020 CT, unchanged from 02/02/2021 CT. Left lower lobe 1.0 cm and 0.8 cm solid pulmonary nodules have both increased since 12/02/2020 CT, unchanged from 02/02/2021 CT. These findings are suspicious for metastatic disease and/or synchronous primary bronchogenic carcinomas. 3. Mild subcarinal lymphadenopathy is new since 12/02/2020. Left hilar adenopathy is stable. Partially visualized retroperitoneal lymphadenopathy in the upper abdomen, not substantially changed since 11/02/2020 CT abdomen/pelvis study. Partially visualized splenomegaly. These findings are uncertain for mildly  progressive lymphoproliferative condition versus metastatic disease. 4. Trace left pleural effusion. 5.  Patchy consolidation in the anterior and dependent basilar left lower lobe is minimally improved from 02/02/2021 and largely new/in a different configuration from 12/02/2020 CT, favoring improving recurrent pneumonia. 6. Three-vessel coronary atherosclerosis. 7. Aortic Atherosclerosis (ICD10-I70.0) and Emphysema (ICD10-J43.9). Electronically Signed   By: Ilona Sorrel M.D.   On: 02/04/2021 12:27   MR BRAIN W WO CONTRAST  Result Date: 02/03/2021 CLINICAL DATA:  History of lung cancer and lymphoma, presents with altered mental status EXAM: MRI HEAD WITHOUT AND WITH CONTRAST TECHNIQUE: Multiplanar, multiecho pulse sequences of the brain and surrounding structures were obtained without and with intravenous contrast. CONTRAST:  7.49mL GADAVIST GADOBUTROL 1 MMOL/ML IV SOLN COMPARISON:  CT head 02/01/2021, MR head 09/29/2020 FINDINGS: The coronal T1 postcontrast images are markedly motion degraded. Brain: There is no evidence of acute intracranial hemorrhage, extra-axial fluid collection, or acute infarct. There is mild global parenchymal volume loss with commensurate enlargement of the ventricular system. Patchy foci of FLAIR signal abnormality in the subcortical and periventricular white matter likely reflects sequela of mild chronic white matter microangiopathy. There is no mass lesion or abnormal enhancement, within the confines of motion degraded coronal T1 postcontrast images. There is no midline shift. Vascular: Normal flow voids. Skull and upper cervical spine: Normal marrow signal. Sinuses/Orbits: There is a mucous retention cyst in the left maxillary sinus. Globes and orbits are unremarkable. Other: None. IMPRESSION: No evidence of intracranial metastatic disease, though the coronal T1 postcontrast images are markedly motion degraded. Electronically Signed   By: Valetta Mole M.D.   On: 02/03/2021 20:59   CT BIOPSY  Result Date: 02/04/2021 Criselda Peaches, MD     02/04/2021 12:37 PM Interventional  Radiology Procedure Note Procedure: CT biopsy of L3 lesion Complications: None Estimated Blood Loss: None Recommendations: - Bedrest x 1 hr - Path sent Signed, Criselda Peaches, MD

## 2021-02-08 DIAGNOSIS — R401 Stupor: Secondary | ICD-10-CM

## 2021-02-08 DIAGNOSIS — R638 Other symptoms and signs concerning food and fluid intake: Secondary | ICD-10-CM

## 2021-02-08 MED ORDER — LORAZEPAM 2 MG/ML IJ SOLN: 1.0000 mg | INTRAMUSCULAR | Status: DC

## 2021-02-08 MED ORDER — HYDROMORPHONE HCL 1 MG/ML IJ SOLN
1.0000 mg | INTRAMUSCULAR | Status: DC
  Administered 2021-02-08: 1 mg via INTRAVENOUS
  Filled 2021-02-08: qty 1

## 2021-02-08 MED ORDER — DIPHENHYDRAMINE HCL 50 MG/ML IJ SOLN: 25.0000 mg | Freq: Four times a day (QID) | INTRAMUSCULAR | Status: DC | PRN

## 2021-02-08 MED ORDER — GUAIFENESIN 100 MG/5ML PO LIQD: 5.0000 mL | Freq: Three times a day (TID) | ORAL | Status: DC | PRN

## 2021-02-08 NOTE — TOC Progression Note (Signed)
Transition of Care [facility]) - Progression Note    Patient Details  Name: Ryan Copeland MRN: [id number] Date of Birth: 1950/05/31  Transition of Care [facility]) CM/SW [place], [place] Phone Number: 02/08/2021, 1:14 PM  Clinical Narrative:   CSW notified by MD and palliative NP that patient's spouse interested in [facility] for residential hospice. CSW notified liaison of referral, they will review for hospice eligibility and bed availability. CSW to follow.    Expected Discharge Plan: [place] Barriers to Discharge: Continued Medical Work up  Expected Discharge Plan and Services Expected Discharge Plan: [place]   Discharge Planning Services: CM Consult Post Acute Care Choice: NA Living arrangements for the past 2 months: Single Family Home                 DME Arranged: N/A DME Agency: NA         HH Agency: NA         Social Determinants of Health (SDOH) Interventions    Readmission Risk Interventions Readmission Risk Prevention Plan 02/20/2020 12/03/2019  Transportation Screening Complete Complete  Medication Review Press photographer) Complete Complete  PCP or Specialist appointment within 3-5 days of discharge - Complete  HRI or [place] Complete Complete  SW Recovery Care/Counseling Consult Complete Complete  Palliative Care Screening Not Applicable Not [place] Not Applicable Not Applicable  Some recent data might be hidden

## 2021-02-08 NOTE — Progress Notes (Signed)
Daily Progress Note   Patient Name: Ryan Copeland       Date: 02/08/2021 DOB: Jul 11, 1950  Age: 70 y.o. MRN#: [id number] Attending Physician: Thurnell Lose, MD Primary Care Physician: [name]., PA-C Admit Date: 02/01/2021  Reason for Consultation/Follow-up: Disposition, Establishing goals of care, Non pain symptom management, Pain control, Psychosocial/spiritual support, and Terminal Care  Subjective: Chart review performed. Received report from primary RN - no acute concerns. RN reports patient is not eating and has minimal liquid intake; also reports continued restlessness.  Went to visit patient at bedside - wife/Ryan Copeland was present. He is lying in bed confused and intermittently awake/asleep. When awake he seems restless - weakly tries to get out of bed - does not open his eyes. Signs/non-verbal gestures of pain and discomfort noted. No respiratory distress, increased work of breathing, or secretions noted.   Emotional support offered to wife. Reviewed interval history since last PMT meeting.   Provided education and counseling at length on the philosophy and benefits of hospice care. Discussed that it offers a holistic approach to care in the setting of end-stage illness, and is about supporting the patient where they are allowing nature to take it's course. Discussed the hospice team includes RNs, physicians, social workers, and chaplains. They can provide personal care, support for the family, and help keep patient out of the hospital as well as assist with DME needs for home hospice. Education provided on the difference between home vs residential hospice. Prognostication reviewed. [name] does not feel she can care for patient's total care needs at home, she is most interested in  residential hospice placement. She [facility] location.  [name] is not interested in pursuing any further radiation treatments for patient, she "just wants him comfortable."  Reviewed symptom management plan - she was agreeable. Patient is no longer able/accepting of pills/liquids - will transition meds to IV where appropriate.   Witnessed RN attempting to give robitussin - patient refused and made him agitated.  Therapeutic listening provided as [name] reflects on the patient's medical history of the course of the year and how difficult it's been.  All questions and concerns addressed. Encouraged to call with questions and/or concerns. PMT card provided.   Length of Stay: 6  Current Medications: Scheduled Meds:   acyclovir  400 mg Oral BID   allopurinol  300 mg Oral Daily   celecoxib  200 mg Oral BID   guaiFENesin  5 mL Oral TID   heparin lock flush  500 Units Intracatheter Q30 days   HYDROcodone-acetaminophen  15 mL Oral Q6H   LORazepam  1 mg Intravenous Q6H    Continuous Infusions:   PRN Meds: acetaminophen **OR** acetaminophen, antiseptic oral rinse, diphenhydrAMINE, diphenoxylate-atropine, glycopyrrolate **OR** glycopyrrolate **OR** glycopyrrolate, haloperidol lactate **OR** haloperidol lactate, heparin lock flush **AND** heparin lock flush, HYDROmorphone (DILAUDID) injection, LORazepam **OR** LORazepam **OR** LORazepam, ondansetron **OR** ondansetron (ZOFRAN) IV, polyvinyl alcohol, sodium chloride flush, zolpidem  Physical Exam Vitals and nursing note reviewed.  Constitutional:      General: He is not in acute distress.    Appearance: He is ill-appearing.  Pulmonary:     Effort: No respiratory distress.  Skin:    General: Skin is warm and dry.  Neurological:     Mental Status: He is lethargic, disoriented and confused.     Motor: Weakness present.  Psychiatric:        Mood and Affect: Mood is anxious.        Speech: Speech is slurred.         Behavior: Behavior is slowed.        Cognition and Memory: Cognition is impaired. Memory is impaired.        Judgment: Judgment is impulsive.            Vital Signs: BP (!) 142/54 (BP Location: Right Arm)    Pulse (!) 46    Temp (!) 97.5 F (36.4 C) (Oral)    Resp 19    SpO2 100%  SpO2: SpO2: 100 % O2 Device: O2 Device: Room Air O2 Flow Rate: O2 Flow Rate (L/min): 2 L/min  Intake/output summary:  Intake/Output Summary (Last 24 hours) at 02/08/2021 1146 Last data filed at 02/08/2021 0900 Gross per 24 hour  Intake 150 ml  Output 510 ml  Net -360 ml   LBM: Last BM Date: 02/04/21 Baseline Weight:   Most recent weight:         Palliative Assessment/Data: PPS 20%      Patient Active Problem List   Diagnosis Date Noted   Sinus bradycardia    Indwelling catheter present on admission 02/02/2021   AMS (altered mental status) 02/01/2021   Aortic root dilation ([place]) 01/03/2021   Bilateral leg edema 10/27/2020   Physical debility 10/27/2020   Lung nodule 09/23/2020   Non-small cell carcinoma of lung, left ([place]) 09/18/2020   Head and neck lymphadenopathy 74/25/9563   Complicated UTI (urinary tract infection) 07/18/2020   Gum lesion 05/29/2020   Acute lower UTI 02/16/2020   Bladder pain 02/16/2020   Chronic hypotension 02/16/2020   Vitamin B12 deficiency 01/01/2020   Hypotension due to drugs 01/01/2020   Protein-calorie malnutrition, moderate ([place]) 01/01/2020   Septic shock ([employer]) 12/15/2019   Community acquired pneumonia 11/28/2019   Cerebral vascular disease 11/25/2019   CIDP (chronic inflammatory demyelinating polyneuropathy) ([place]) 11/25/2019   Other fatigue 10/25/2019   Deficiency anemia 10/25/2019   Dizziness 10/22/2019   Gait abnormality 10/22/2019   Hx of nonmelanoma skin cancer 03/15/2019   Essential hypertension 04/24/2018   Influenza A 04/17/2018   Sepsis ([place]) 04/17/2018   Chronic diastolic CHF (congestive heart failure) ([place]) 04/17/2018   Skin rash  08/07/2017   Port-A-Cath in place 08/04/2017   Chronic apical periodontitis 04/26/2017   Retained dental roots 04/26/2017   Dental caries  04/26/2017   Chronic periodontitis 04/26/2017   Loose, teeth 04/26/2017   Peripheral polyneuropathy 04/14/2017   COPD mixed type ([place]) 02/23/2017   Chronic sinusitis 01/27/2017   Splenomegaly, congestive, chronic 12/29/2016   Diarrhea 12/01/2016   Poor dentition 12/01/2016   Benign mole 12/01/2016   Pancytopenia, acquired ([place]) 11/03/2016   Odynophagia 01/15/2016   Polycythemia, secondary 06/26/2015   Quality of life palliative care encounter 06/26/2015   Atherosclerosis of extremity with intermittent claudication ([place]) 01/23/2015   Lateral epicondylitis of left elbow    ECRB (extensor carpi radialis brevis) tenosynovitis    Anxiety 01/30/2014   Preoperative clearance 01/30/2014   Thrombocytopenia ([place]) 01/30/2014   Diabetes mellitus without complication ([place]) 23/76/2831   Hypogammaglobulinemia, acquired ([place]) 12/26/2012   Hereditary and idiopathic peripheral neuropathy 08/20/2012   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 Wife reported he has also been unsteady on his feet and had 2 unwitnessed falls in the past 3 days. CT of the head was negative. Chest x-ray showed possible left lung base pneumonia. Admitted to [facility] with acute metabolic  encephalopathy.     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 Copy was made and will be scanned into Vyn[facility]/ACP tab Wife would like patient transferred to [facility] location - transfer when bed available, eval currently pending Further minimized medications to focus on comfort - patient is now having a hard time with anything PO - adjusted medications to IV where appropriate Adjusted scheduled ativan 1mg  IV q6h to q4h, continue PRN dose Changed scheduled hydrocodone-acetaminophen liquid q6h to dilaudid 1mg  q4h, continue PRN dose Adjusted TID cough medication to q8h PRN Adjusted benadryl PO to IV Discontinued all pills - acyclovir, allopurinol, Celebrex  Nursing to provide frequent assessments and administer PRN medications as clinically necessary to ensure EOL comfort PMT will continue to follow and support holistically  Goals of Care and Additional Recommendations: Limitations on Scope of Treatment: Full Comfort Care  Code Status:    Code Status Orders  (From admission, onward)           Start     Ordered   02/06/21 1655  Do not attempt resuscitation (DNR)  Continuous       Question Answer Comment  In the event of cardiac or respiratory ARREST Do not call a code blue   In the event of cardiac or respiratory ARREST Do not perform Intubation, CPR, defibrillation or ACLS   In the event of cardiac or respiratory ARREST Use medication by any route, position, wound care, and other measures to relive pain and suffering. May use oxygen, suction and manual treatment of airway obstruction as needed for comfort.  02/06/21 1654           Code Status History     Date Active Date Inactive Code Status Order ID  Comments User Context   02/01/2021 2047 02/06/2021 1654 DNR [id number]  Ileene Musa T, DO ED   02/01/2021 2045 02/01/2021 2047 DNR [id number]  Orene Desanctis, DO ED   11/02/2020 1713 11/06/2020 2115 Full Code [id number]  Jacky Kindle, MD ED   07/18/2020 1111 07/20/2020 2236 DNR [id number]  Karmen Bongo, MD Inpatient   02/15/2020 0931 02/20/2020 2056 Full Code [id number]  Cristal Generous, NP ED   12/15/2019 1135 12/20/2019 1741 Full Code [id number]  Erick Colace, NP ED   11/28/2019 1846 12/05/2019 1813 Full Code [id number]  [place] Eric J, DO ED   04/18/2018 0111 04/21/2018 1524 Full Code [id number]  Ivor Costa, MD ED   01/01/2015 1056 01/01/2015 1919 Full Code [id number]  Conrad Kearney, MD Inpatient   09/26/2014 0927 09/26/2014 1527 Full Code [id number]  [place], Peter M, MD Inpatient       Prognosis:  < 2 weeks  Discharge Planning: Hospice facility  Care plan was discussed with primary RN, patient's wife, Dr. Candiss Norse, [facility]  Thank you for allowing the Palliative Medicine Team to assist in the care of this patient.   Total Time 65 minutes  Prolonged Time Billed  yes       Greater than 50%  of this time was spent counseling and coordinating care related to the above assessment and plan.  Lin Landsman, NP  Please contact Palliative Medicine Team phone at [phone] for questions and concerns.

## 2021-02-08 NOTE — Plan of Care (Signed)
  Problem: Health Behavior/Discharge Planning: Goal: Ability to manage health-related needs will improve Outcome: Progressing   Problem: Activity: Goal: Risk for activity intolerance will decrease Outcome: Progressing   Problem: Nutrition: Goal: Adequate nutrition will be maintained Outcome: Progressing   

## 2021-02-08 NOTE — Discharge Summary (Signed)
Ryan Copeland DIY:[id number] DOB: January 18, 1951 DOA: 02/01/2021  PCP: [name]., PA-C  Admit date: 02/01/2021  Discharge date: 02/08/2021  Admitted From: Home   Disposition:  Residential Hospice   Recommendations for Outpatient Follow-up:   Follow up with PCP in 1-2 weeks  PCP Please obtain BMP/CBC, 2 view CXR in 1week,  (see Discharge instructions)   PCP Please follow up on the following pending results:     Home Health: None   Equipment/Devices: None  Consultations: Pall.Care, Onc, Cards, Rad-Onc, N.Surg, IR Discharge Condition: Guarded  CODE STATUS: DNR  Diet Recommendation: Soft    Chief Complaint  Patient presents with   Altered Mental Status     Brief history of present illness from the day of admission and additional interim summary    Ryan Copeland is a 70 y.o. 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                                                                 Hospital Course    Patient is now full comfort measures.  All known comfort medications have been stopped.  He is DNR.  Likely disposition to residential hospice today.   Other medical problems actively addressed earlier this admission are below.       Toxic encephalopathy in a patient with non-small cell cancer of the lung with now possible L3 metastatic lesion, chronic indwelling Foley catheter  - he is currently headache free with no neck stiffness or photophobia, head CT unremarkable.  Most likely  source of his encephalopathy is ongoing lower back pain with lack of good quality sleep and being on narcotics for the back pain.  MRI brain nonacute no focal deficits.  With supportive care, pain control, improved sleep mentation has improved somewhat, now comfort cre only at Res.Hospice.   Ongoing lower back pain for the last 1 week.  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   Non-small cell left lung cancer now possible mets to the L-spine ? Mild meningeal spread.  Recently completed radiation treatment.  Under the care of Dr. Valeta Harms pulmonary and Dr. Lisbeth Renshaw radiation oncology, CT scan noted, biopsy results prelim suggestive of metastatic lesion to the L-spine.  Discussed with Dr. Valeta Harms, will follow in the office.  Prognosis does not  appear excellent.   Small cell B-cell lymphoma (CLL) - under the care of Dr. Alvy Bimler Case discussed with her, hold Calquence in the acute setting,now comfort care and comfort Meds only.   Questionable pneumonia on admission.  Ruled out clinically.  I do not think he has active pneumonia have titrated off antibiotics, cultures have remained negative.   Hypogammaglobinemia requiring monthly IVIG treatment.  Supportive care.   Chronic indwelling suprapubic catheter.  Catheter changed on 02/01/2021.  Monitor.  UA appears stable.   History of gout.  Continue acyclovir.   Dyslipidemia.  On statin.  Bradycardia - asymptomatic, Rate drops into 40s, upon sleeping at 2pm - low 20s, ? Home medicine Midodrene - stop, check TSH and EKG, no other potential bradycardic agents, poor pacemaker candidate, Cards input x 1, ? Short term Isoproterenol - BP stable.  Hypokalemia.  Replaced.     Chronic moderate to  severe thrombocytopenia.  Monitor.  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  Residential hospice Condition.  Guarded CODE STATUS.  DNR Activity.  With assistance as tolerated, full fall precautions. Diet.  Soft with feeding assistance and aspiration precautions. Goal of care.  Comfort.   Increase activity slowly   Complete by: As directed        Discharge Medications   Allergies as of 02/08/2021       Reactions   Immune Globulin Hives, Rash   Pt reports hives/rash on body with IV Privigen. He was changed to IV Gamunex-C and had many infusions without adverse side effects.   Codeine Hives   Pt states he can take a few, more reaction with extended doses.        Medication List     STOP taking these medications    ALPRAZolam 0.5 MG tablet Commonly known as: XANAX   b complex vitamins tablet   Calquence 100 MG Tabs Generic drug: Acalabrutinib Maleate   Cholecalciferol 25 MCG (1000 UT) tablet   HYDROcodone bit-homatropine 5-1.5 MG/5ML syrup Commonly known as: HYCODAN   HYDROcodone-acetaminophen 10-325 MG tablet Commonly known as: NORCO   metFORMIN 500 MG 24 hr tablet Commonly known as: GLUCOPHAGE-XR   midodrine 5 MG tablet Commonly known as: PROAMATINE   montelukast 10 MG tablet Commonly known as: SINGULAIR   oxybutynin 5 MG tablet Commonly known as: DITROPAN   pregabalin 200 MG capsule Commonly known as: LYRICA   rosuvastatin 5 MG tablet Commonly known  as: CRESTOR   sildenafil 100 MG tablet Commonly known as: Viagra   tamsulosin 0.4 MG Caps capsule Commonly known as: FLOMAX   zolpidem 10 MG tablet Commonly known as: AMBIEN       TAKE these medications    acetaminophen 325 MG tablet Commonly known as: TYLENOL Take 2 tablets (650 mg total) by mouth every 6 (six) hours as needed for moderate pain or fever.   acyclovir 400 MG tablet Commonly known as: ZOVIRAX TAKE 1 TABLET BY MOUTH TWICE A DAY  allopurinol 300 MG tablet Commonly known as: ZYLOPRIM Take 1 tablet (300 mg total) by mouth daily.   benzonatate 100 MG capsule Commonly known as: TESSALON TAKE TWO CAPSULES BY MOUTH THREE TIMES A DAY AS NEEDED What changed: See the new instructions.   diphenoxylate-atropine 2.5-0.025 MG tablet Commonly known as: LOMOTIL Take 1 tablet by mouth 4 (four) times daily as needed for diarrhea or loose stools.   famotidine 20 MG tablet Commonly known as: PEPCID TAKE 1 TABLET BY MOUTH TWICE A DAY   furosemide 20 MG tablet Commonly known as: LASIX Take 20 mg by mouth daily.   lidocaine-prilocaine cream Commonly known as: EMLA Apply 1 application topically as needed (prior chemo/IBIG). What changed: additional instructions   loperamide 2 MG capsule Commonly known as: IMODIUM Take 2 capsules (4 mg total) by mouth 4 (four) times daily.   meclizine 12.5 MG tablet Commonly known as: ANTIVERT Take 12.5 mg by mouth 3 (three) times daily as needed for dizziness.   nitroGLYCERIN 0.4 MG SL tablet Commonly known as: NITROSTAT PLACE 1 TABLET UNDER THE TONGUE EVERY 5 MINUTES X 3 DOSES AS NEEDED FOR CHEST PAIN *MAX 3 DOSES* What changed:  how much to take how to take this when to take this reasons to take this         Follow-up Information     [name]., PA-C. Schedule an appointment as soon as possible for a visit in 1 week(s).   Specialty: Family Medicine Contact information: [address] [place]  [address] [phone]         Freada Bergeron, MD .   Specialties: Cardiology, Radiology Contact information: [address] [address] [phone]         [name], MD. Schedule an appointment as soon as possible for a visit in 1 week(s).   Specialty: Hematology and Oncology Contact information: [place] [address] [phone]         Kyung Rudd, MD. Schedule an appointment as soon as possible for a visit in 1 week(s).   Specialty: Radiation Oncology Contact information: [address] [place] [address] [phone]         [name], DO. Schedule an appointment as soon as possible for a visit in 1 week(s).   Specialty: Pulmonary Disease Contact information: [place] [address] [phone]                 Major procedures and Radiology Reports - PLEASE review detailed and final reports thoroughly  -         DG Chest 2 View  Result Date: 02/01/2021 CLINICAL DATA:  Altered mental status. EXAM: CHEST - 2 VIEW COMPARISON:  Chest x-ray 12/08/2020. FINDINGS: Right chest port catheter tip projects over the distal SVC. The heart is mildly enlarged, unchanged. There is some minimal interstitial and patchy opacities in both lung bases, left greater than right. There is no pleural effusion or pneumothorax. There is a stable calcified nodule in the right mid lung. No acute fractures are identified. IMPRESSION: 1. Cardiomegaly with mild interstitial edema. Infection is not excluded in the left lung base. Electronically Signed   By: Ronney Asters M.D.   On: 02/01/2021 17:46   CT HEAD WO CONTRAST (5MM)  Result Date: 02/06/2021 CLINICAL DATA:  Delirium EXAM: CT HEAD WITHOUT CONTRAST TECHNIQUE: Contiguous axial images were obtained from the base of the skull through the  vertex without intravenous contrast. COMPARISON:  MR brain, 02/03/2021 FINDINGS: Brain: No  evidence of acute infarction, hemorrhage, hydrocephalus, extra-axial collection or mass lesion/mass effect. Mild periventricular white matter hypodensity. Vascular: No hyperdense vessel or unexpected calcification. Skull: Normal. Negative for fracture or focal lesion. Sinuses/Orbits: No acute finding. Other: None. IMPRESSION: No acute intracranial pathology. Mild small-vessel white matter disease. Electronically Signed   By: Delanna Ahmadi M.D.   On: 02/06/2021 16:47   CT Head Wo Contrast  Result Date: 02/01/2021 CLINICAL DATA:  Mental status change, unknown cause EXAM: CT HEAD WITHOUT CONTRAST TECHNIQUE: Contiguous axial images were obtained from the base of the skull through the vertex without intravenous contrast. COMPARISON:  CT head 11/02/2020 BRAIN: BRAIN Patchy and confluent areas of decreased attenuation are noted throughout the deep and periventricular white matter of the cerebral hemispheres bilaterally, compatible with chronic microvascular ischemic disease. No evidence of large-territorial acute infarction. No parenchymal hemorrhage. No mass lesion. No extra-axial collection. No mass effect or midline shift. No hydrocephalus. Basilar cisterns are patent. Vascular: No hyperdense vessel. Atherosclerotic calcifications are present within the cavernous internal carotid arteries. Skull: No acute fracture or focal lesion. Sinuses/Orbits: Paranasal sinuses and mastoid air cells are clear. The orbits are unremarkable. Other: None. IMPRESSION: No acute intracranial abnormality. Electronically Signed   By: Iven Finn M.D.   On: 02/01/2021 18:56   CT CHEST WO CONTRAST  Result Date: 02/02/2021 CLINICAL DATA:  Pneumonia. EXAM: CT CHEST WITHOUT CONTRAST TECHNIQUE: Multidetector CT imaging of the chest was performed following the standard protocol without IV contrast. COMPARISON:  chest x-ray earlier same day.  Chest CT 12/02/2020 FINDINGS: Cardiovascular: Heart size upper normal. Coronary artery  calcification is evident. Mild atherosclerotic calcification is noted in the wall of the thoracic aorta. Right Port-A-Cath tip is positioned in the distal SVC. Mediastinum/Nodes: Stable upper normal subcarinal lymph node. No evidence for gross hilar lymphadenopathy although assessment is limited by the lack of intravenous contrast on the current study. The esophagus has normal imaging features. There is no axillary lymphadenopathy. Similar appearance of subpectoral lymph nodes bilaterally. Lungs/Pleura: Clear progression of the left upper lobe pulmonary nodule now measuring 1.6 cm in the same dimension measured previously at 1.2 cm. Patchy and nodular airspace disease in the left lower lobe is progressive in the interval with associated volume loss. Fine detail is obscured by breathing motion. Dependent collapse/consolidative opacity is seen in both lungs, left greater than right no substantial pleural effusion. Upper Abdomen: Liver parenchyma heterogeneous on noncontrast imaging. Prominent spleen is similar to prior but incompletely visualized. Musculoskeletal: No worrisome lytic or sclerotic osseous abnormality. IMPRESSION: 1. Clear progression of the left upper lobe pulmonary nodule, now measuring 1.6 cm in the same dimension that was previously measured at 1.2 cm. 2. Interval progression of patchy and nodular airspace disease in the left lower lobe with associated volume loss. Imaging features likely related to infectious/inflammatory etiology. 3. Aortic Atherosclerosis (ICD10-I70.0). Electronically Signed   By: Misty Stanley M.D.   On: 02/02/2021 11:07   CT CHEST W CONTRAST  Result Date: 02/04/2021 CLINICAL DATA:  Inpatient. Non-small cell lung cancers status post SBRT in December. Restaging. EXAM: CT CHEST WITH CONTRAST TECHNIQUE: Multidetector CT imaging of the chest was performed during intravenous contrast administration. CONTRAST:  76mL OMNIPAQUE IOHEXOL 350 MG/ML SOLN COMPARISON:  02/02/2021 and  12/02/2020 chest CT. FINDINGS: Cardiovascular: Top-normal heart size. No significant pericardial effusion/thickening. Three-vessel coronary atherosclerosis. Right internal jugular Port-A-Cath terminates at the cavoatrial junction. Atherosclerotic nonaneurysmal thoracic aorta. Top-normal caliber  main pulmonary artery (3.2 cm diameter). No central pulmonary emboli. Mediastinum/Nodes: No discrete thyroid nodules. Unremarkable esophagus. No axillary adenopathy. Enlarged 1.3 cm subcarinal node (series 3/image 84), newly enlarged since 12/02/2020, unchanged from 02/02/2021. Enlarged 1.5 cm left hilar node (series 3/image 84), unchanged from 02/02/2021, unchanged from 12/02/2020. No right hilar adenopathy. Lungs/Pleura: No pneumothorax. Trace to left pleural effusion. No right pleural effusion. Mild centrilobular and paraseptal emphysema with mild diffuse bronchial wall thickening. Spiculated solid 1.9 x 1.7 cm anterior left upper lobe pulmonary nodule (series 7/image 88), increased from 1.4 x 1.2 cm on 12/02/2020 CT, unchanged from 02/02/2021 CT. Irregular solid medial central left upper lobe 2.4 x 1.7 cm nodule (series 7/image 81), mildly increased from 2.2 x 1.8 cm on 12/02/2020, unchanged from 02/02/2021. Solid 0.8 cm superior segment left lower lobe pulmonary nodule (series 7/image 76), increased from 0.4 cm on 12/02/2020, unchanged from 02/02/2021. Anterior left lower lobe 1.0 cm solid pulmonary nodule (series 7/image 125), increased from 0.4 cm on 12/02/2020, unchanged from 02/02/2021. Patchy consolidation in the anterior and dependent basilar left lower lobe is minimally improved from 02/02/2021 and largely new/in a different configuration from 12/02/2020. Several additional smaller solid pulmonary nodules scattered in both lungs are not substantially changed from 12/02/2020 or 02/02/2021, for example measuring 1.0 cm in the medial right middle lobe (series 7/image 106) and 0.6 cm in the posterior left upper lobe  (series 7/image 81). Upper abdomen: Cholecystectomy. Partially visualized retroperitoneal lymphadenopathy involving the left para-aortic and aortocaval chains, not substantially changed since 11/02/2020 CT abdomen/pelvis study, for example measuring 2.0 cm in the left periaortic chain (series 3/image 189) and 1.6 cm in the aortocaval chain (series 3/image 187). Partially visualized splenomegaly, unchanged. Musculoskeletal: No aggressive appearing focal osseous lesions. Mild thoracic spondylosis. IMPRESSION: 1. Spiculated solid 1.9 cm anterior left upper lobe pulmonary nodule, increased from 1.4 cm on 12/02/2020 CT, unchanged from 02/02/2021 CT, suspicious for primary bronchogenic carcinoma. 2. Medial central left upper lobe irregular solid 2.4 cm nodule, increased from 2.2 cm on 12/02/2020 CT, unchanged from 02/02/2021 CT. Left lower lobe 1.0 cm and 0.8 cm solid pulmonary nodules have both increased since 12/02/2020 CT, unchanged from 02/02/2021 CT. These findings are suspicious for metastatic disease and/or synchronous primary bronchogenic carcinomas. 3. Mild subcarinal lymphadenopathy is new since 12/02/2020. Left hilar adenopathy is stable. Partially visualized retroperitoneal lymphadenopathy in the upper abdomen, not substantially changed since 11/02/2020 CT abdomen/pelvis study. Partially visualized splenomegaly. These findings are uncertain for mildly progressive lymphoproliferative condition versus metastatic disease. 4. Trace left pleural effusion. 5. Patchy consolidation in the anterior and dependent basilar left lower lobe is minimally improved from 02/02/2021 and largely new/in a different configuration from 12/02/2020 CT, favoring improving recurrent pneumonia. 6. Three-vessel coronary atherosclerosis. 7. Aortic Atherosclerosis (ICD10-I70.0) and Emphysema (ICD10-J43.9). Electronically Signed   By: Ilona Sorrel M.D.   On: 02/04/2021 12:27   CT LUMBAR SPINE WO CONTRAST  Result Date:  02/02/2021 CLINICAL DATA:  70 year old male with lung cancer undergoing treatment. Increased falls. Right side low back pain. Altered mental status. EXAM: CT LUMBAR SPINE WITHOUT CONTRAST TECHNIQUE: Multidetector CT imaging of the lumbar spine was performed without intravenous contrast administration. Multiplanar CT image reconstructions were also generated. COMPARISON:  CT chest and pelvis today reported separately. Lumbar MRI 04/13/2015. CT Abdomen and Pelvis 02/16/2020. FINDINGS: Segmentation: Normal. Alignment: Stable lumbar lordosis since January. No spondylolisthesis. Vertebrae: Round lucent/lytic lesion of the L3 anterior superior vertebral body (coronal image 17 and series 4, image 55) has partially sclerotic margin  and abuts the superior endplate, but there was no is new since January with no evidence of developing Schmorl's node at that time. The lesion is about 16 mm diameter. And background bone mineralization appears more heterogeneous since January. But there is no other discrete osseous lesion identified. Chronic sclerosis of the medial left iliac bone appears stable and benign. Paraspinal and other soft tissues: Aortoiliac calcified atherosclerosis. Left Common iliac artery vascular stent. Some oral contrast is present in bowel. Lumbar paraspinal soft tissues remain within normal limits. Disc levels: T12-L1:  Negative. L1-L2:  Mild disc bulging.  No stenosis. L2-L3: Circumferential disc bulge appears progressed since 2017, with broad-based posterior component. However, there is no significant stenosis. L3-L4: Minor disc bulging appears increased since 2017. No stenosis. L4-L5: Mild disc bulging appears increased since 2017. No stenosis. L5-S1:  Mild disc bulging appears stable since 2017.  No stenosis. IMPRESSION: 1. A 16 mm lucent lesion of the L3 vertebral body is new since January and more suspicious for osseous metastatic disease than a degenerative Schmorl's node. Lumbar MRI (without and with  contrast preferred) may characterize further. 2. No lumbar pathologic fracture or other acute finding by CT. Mild for age lumbar spine degeneration. Electronically Signed   By: Genevie Ann M.D.   On: 02/02/2021 11:10   CT PELVIS WO CONTRAST  Result Date: 02/02/2021 CLINICAL DATA:  Recent falls. EXAM: CT PELVIS WITHOUT CONTRAST TECHNIQUE: Multidetector CT imaging of the pelvis was performed following the standard protocol without intravenous contrast. COMPARISON:  November 02, 2020. FINDINGS: Urinary Tract: Bladder is decompressed by a suprapubic catheter. No abnormality visualized. Bowel:  Unremarkable visualized pelvic bowel loops. Vascular/Lymphatic: No pathologically enlarged lymph nodes. Aortoiliac atherosclerotic vascular disease. Reproductive:  Unchanged mild prostatomegaly. Other:  Unchanged splenomegaly, partially visualized. Musculoskeletal: No acute or significant osseous findings. IMPRESSION: 1. No acute osseous abnormality. 2. Unchanged splenomegaly. 3. Aortic Atherosclerosis (ICD10-I70.0). Electronically Signed   By: Titus Dubin M.D.   On: 02/02/2021 11:06   MR BRAIN W WO CONTRAST  Result Date: 02/03/2021 CLINICAL DATA:  History of lung cancer and lymphoma, presents with altered mental status EXAM: MRI HEAD WITHOUT AND WITH CONTRAST TECHNIQUE: Multiplanar, multiecho pulse sequences of the brain and surrounding structures were obtained without and with intravenous contrast. CONTRAST:  7.46mL GADAVIST GADOBUTROL 1 MMOL/ML IV SOLN COMPARISON:  CT head 02/01/2021, MR head 09/29/2020 FINDINGS: The coronal T1 postcontrast images are markedly motion degraded. Brain: There is no evidence of acute intracranial hemorrhage, extra-axial fluid collection, or acute infarct. There is mild global parenchymal volume loss with commensurate enlargement of the ventricular system. Patchy foci of FLAIR signal abnormality in the subcortical and periventricular white matter likely reflects sequela of mild chronic  white matter microangiopathy. There is no mass lesion or abnormal enhancement, within the confines of motion degraded coronal T1 postcontrast images. There is no midline shift. Vascular: Normal flow voids. Skull and upper cervical spine: Normal marrow signal. Sinuses/Orbits: There is a mucous retention cyst in the left maxillary sinus. Globes and orbits are unremarkable. Other: None. IMPRESSION: No evidence of intracranial metastatic disease, though the coronal T1 postcontrast images are markedly motion degraded. Electronically Signed   By: Valetta Mole M.D.   On: 02/03/2021 20:59   MR LUMBAR SPINE WO CONTRAST  Result Date: 02/02/2021 CLINICAL DATA:  Lumbar radiculopathy, symptoms persist with > 6 wks treatment EXAM: MRI LUMBAR SPINE WITHOUT CONTRAST TECHNIQUE: Multiplanar, multisequence MR imaging of the lumbar spine was performed. No intravenous contrast was administered. COMPARISON:  CT  of the lumbar spine 02/02/2021. MRI of the lumbar spine 04/12/2005. FINDINGS: The patient refused postcontrast imaging. Segmentation: Standard segmentation is assumed. Alignment:  Normal Vertebrae: Approximately 2 cm round lesion within the anterior/superior L3 vertebral body, which is T1 hypointense and T2/STIR intermediate in signal with surrounding STIR hyperintense edema. Approximately 10 mm L2 and 8 mm L1 lesions, which are T1 and T2 hypointense (series 3 and 4, image 9) without appreciable STIR hyperintensity. Conus medullaris and cauda equina: Conus extends to the T2 level level. Conus appears normal. Paraspinal and other soft tissues: Unremarkable. Disc levels: T12-L1: No significant disc protrusion, foraminal stenosis, or canal stenosis. L1-L2: No significant disc protrusion, foraminal stenosis, or canal stenosis. L2-L3: No significant disc protrusion, foraminal stenosis, or canal stenosis. L3-L4: Mild facet arthropathy and slight disc bulge without significant stenosis. L4-L5: Small broad disc bulge and mild  bilateral facet arthropathy. No significant canal or foraminal stenosis. L5-S1: No significant disc protrusion, foraminal stenosis, or canal stenosis. IMPRESSION: 1. Approximately 2 cm round lesion within the anterior/superior L3 vertebral body, concerning for metastatic disease in this patient with known lung cancer. Surrounding marrow edema may be reactive or represent superimposed pathologic fracture. 2. Approximately 10 mm L2 and 8 mm L1 lesions are indeterminate. The patient refused contrast on this study. If the patient is willing, postcontrast imaging may be helpful to better evaluate. Alternatively, follow-up MRI could assess stability. 3. No significant canal or foraminal stenosis. Electronically Signed   By: Margaretha Sheffield M.D.   On: 02/02/2021 15:08   CT BIOPSY  Result Date: 02/04/2021 Criselda Peaches, MD     02/04/2021 12:37 PM Interventional Radiology Procedure Note Procedure: CT biopsy of L3 lesion Complications: None Estimated Blood Loss: None Recommendations: - Bedrest x 1 hr - Path sent Signed, Criselda Peaches, MD   DG Chest Port 1 View  Result Date: 02/02/2021 CLINICAL DATA:  70 year old male with altered mental status and shortness of breath. History of lymphoma. And history of malignant cells on left upper lobe needle biopsy in October. EXAM: PORTABLE CHEST 1 VIEW COMPARISON:  Chest radiographs 02/01/2021 and earlier. FINDINGS: Portable AP semi upright view at 0740 hours. Stable right chest power port. Larger lung volumes. Normal cardiac size and mediastinal contours. Visualized tracheal air column is within normal limits. Left mid lung nodule with adjacent surgical clip appears grossly stable from October CT (please see that report). No superimposed pneumothorax, pulmonary edema, pleural effusion or new pulmonary opacity. No acute osseous abnormality identified. IMPRESSION: 1. Grossly stable left lung nodule corresponding to known non-small cell carcinoma. 2. Larger lung  volumes with no new cardiopulmonary abnormality. Electronically Signed   By: Genevie Ann M.D.   On: 02/02/2021 07:56       Today   Subjective    Ryan Copeland today has no headache,no chest abdominal pain,no new weakness tingling or numbness, +ve low back [ain.   Objective   Blood pressure (!) 142/54, pulse (!) 46, temperature (!) 97.5 F (36.4 C), temperature source Oral, resp. rate 19, SpO2 100 %.   Intake/Output Summary (Last 24 hours) at 02/08/2021 1433 Last data filed at 02/08/2021 1250 Gross per 24 hour  Intake 90 ml  Output 910 ml  Net -820 ml    Exam  Awake Alert x1, No new F.N deficits,   [place].AT,PERRAL Supple Neck,No JVD, No cervical lymphadenopathy appriciated.  Symmetrical Chest wall movement, Good air movement bilaterally, CTAB RRR,No Gallops,Rubs or new Murmurs, No Parasternal Heave +ve B.Sounds, Abd Soft, Non tender,  No organomegaly appriciated, No rebound -guarding or rigidity, Suprapubic catheter. No Cyanosis, Clubbing or edema, No new Rash or bruise   Data Review   CBC w Diff:  Lab Results  Component Value Date   WBC 3.7 (L) 02/06/2021   HGB 12.6 (L) 02/06/2021   HGB 13.5 05/18/2018   HGB 13.6 01/26/2017   HCT 38.5 (L) 02/06/2021   HCT 39.5 01/26/2017   PLT 56 (L) 02/06/2021   PLT 71 (L) 05/18/2018   PLT 84 (L) 01/26/2017   LYMPHOPCT 9 02/06/2021   LYMPHOPCT 13.1 (L) 01/26/2017   BANDSPCT 6 11/06/2020   MONOPCT 21 02/06/2021   MONOPCT 10.9 01/26/2017   EOSPCT 3 02/06/2021   EOSPCT 2.0 01/26/2017   BASOPCT 1 02/06/2021   BASOPCT 0.8 01/26/2017    CMP:  Lab Results  Component Value Date   NA 143 02/06/2021   NA 142 06/19/2019   NA 136 01/26/2017   K 3.3 (L) 02/06/2021   K 4.0 01/26/2017   CL 112 (H) 02/06/2021   CL 104 08/03/2012   CO2 21 (L) 02/06/2021   CO2 23 01/26/2017   BUN 14 02/06/2021   BUN 13 06/19/2019   BUN 9.6 01/26/2017   CREATININE 0.95 02/06/2021   CREATININE 0.71 12/04/2020   CREATININE 1.0 01/26/2017   PROT 5.8  (L) 02/06/2021   PROT 5.9 (L) 04/13/2017   PROT 5.7 (L) 01/26/2017   ALBUMIN 3.2 (L) 02/06/2021   ALBUMIN 3.6 01/26/2017   BILITOT 1.0 02/06/2021   BILITOT 0.4 12/04/2020   BILITOT 0.64 01/26/2017   ALKPHOS 70 02/06/2021   ALKPHOS 78 01/26/2017   AST 18 02/06/2021   AST 29 12/04/2020   AST 13 01/26/2017   ALT 16 02/06/2021   ALT 30 12/04/2020   ALT 12 01/26/2017  .   Total Time in preparing paper work, data evaluation and todays exam - 44 minutes  Lala Lund M.D on 02/08/2021 at 2:33 PM  [facility]

## 2021-02-08 NOTE — Progress Notes (Signed)
DISCHARGE NOTE [facility] to be discharged  [facility]  per MD order. Patient verbalized understanding.  Skin clean, dry and intact without evidence of skin break down, no evidence of skin tears noted. IV catheter discontinued intact. Site without signs and symptoms of complications. Dressing and pressure applied. Pt denies pain at the site currently. No complaints noted.  Patient free of lines, drains, and wounds. Suprapubic cath left in place.  Discharge packet assembled. An After Visit Summary (AVS) was printed and given to the EMS personnel. Patient escorted via stretcher and discharged to [facility] via ambulance. Report called to accepting facility; all questions and concerns addressed.   Dolores Hoose, RN

## 2021-02-08 NOTE — TOC Transition Note (Signed)
Transition of Care [facility]) - CM/SW Discharge Note   Patient Details  Name: Ryan Copeland MRN: [id number] Date of Birth: November 13, 1950  Transition of Care [facility]) CM/SW Contact:  Geralynn Ochs, LCSW Phone Number: 02/08/2021, 2:46 PM   Clinical Narrative:   Nurse to call report to [phone].    Final next level of care: [place] Barriers to Discharge: Barriers Resolved   Patient Goals and CMS Choice Patient states their goals for this hospitalization and ongoing recovery are:: To go home CMS Medicare.gov Compare Post Acute Care list provided to:: Patient Choice offered to / list presented to : Patient  Discharge Placement                Patient to be transferred to facility by: [facility] Name of family member notified: Spouse Patient and family notified of of transfer: 02/08/21  Discharge Plan and Services   Discharge Planning Services: CM Consult Post Acute Care Choice: NA          DME Arranged: N/A DME Agency: NA         HH Agency: NA        Social Determinants of Health ([place]) Interventions     Readmission Risk Interventions Readmission Risk Prevention Plan 02/20/2020 12/03/2019  Transportation Screening Complete Complete  Medication Review Press photographer) Complete Complete  PCP or Specialist appointment within 3-5 days of discharge - Complete  HRI or [place] Complete Complete  SW Recovery Care/Counseling Consult Complete Complete  Palliative Care Screening Not Applicable Not [place] Not Applicable Not Applicable  Some recent data might be hidden

## 2021-02-08 NOTE — Progress Notes (Signed)
PROGRESS NOTE                                                                                                                                                                                                             Patient Demographics:    Ryan Copeland, is a 70 y.o. male, DOB - 06-05-1950, DPO:[id number]  Outpatient Primary MD for the patient is [name]., PA-C    LOS - 6  Admit date - 02/01/2021    Chief Complaint  Patient presents with   Altered Mental Status       Brief Narrative (HPI from H&P)   Ryan Copeland is a 70 y.o. 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   Subjective:   Patient in bed, appears comfortable, denies any headache, no fever, no chest pain or pressure, no shortness of breath , no abdominal pain. No new focal weakness, +ve back pain.   Assessment  & Plan :    Patient is now full comfort measures.  All known comfort medications have been stopped.  He is DNR.  Likely disposition to residential hospice soon.    Other medical problems actively addressed earlier this admission are below.     Toxic encephalopathy in a patient with non-small cell cancer of the lung with now possible L3 metastatic lesion, chronic indwelling Foley catheter  - he is currently headache free with no neck stiffness or photophobia, head CT unremarkable.  Most likely source of his encephalopathy is ongoing lower back pain with lack of good quality sleep and being on narcotics for the back pain.  MRI brain nonacute no focal deficits.  With supportive care, pain control, improved sleep mentation has  improved somewhat  Ongoing lower back pain for the last 1 week.  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  Palliative radiation treatments as  outpatient if desired..  Non-small cell left lung cancer now possible mets to the L-spine ? Mild meningeal spread.  Recently completed radiation treatment.  Under the care of Dr. Valeta Harms pulmonary and Dr. Lisbeth Renshaw radiation oncology, CT scan noted, biopsy results prelim suggestive of metastatic lesion to the L-spine.  Discussed with Dr. Valeta Harms, will follow in the office.  Prognosis does not appear excellent.  Small cell B-cell lymphoma (CLL) - under the care of Dr. Alvy Bimler Case discussed with her, hold [place] in the acute setting, defer management to oncology.  Questionable pneumonia on admission.  Ruled out clinically.  I do not think he has active pneumonia have titrated off antibiotics, cultures have remained negative.  Hypogammaglobinemia requiring monthly IVIG treatment.  Supportive care.  Chronic indwelling suprapubic catheter.  Catheter changed on 02/01/2021.  Monitor.  UA appears stable.  History of gout.  Continue acyclovir.  Dyslipidemia.  On statin.   History of CAD with LAD stent.  Aortic root dilation.  Blood pressure low hence no beta-blocker, severe baseline thrombocytopenia hence no aspirin.  Continue statin for supportive care with outpatient PCP and cardiology follow-up  Hypokalemia.  Replaced.    Chronic moderate to severe thrombocytopenia.  Monitor.  Lab Results  Component Value Date   PLT 56 (L) 02/06/2021    Bradycardia - asymptomatic, Rate drops into 40s, upon sleeping at 2pm - low 20s, ? Home medicine Midodrene - stop, check TSH and EKG, no  other potential bradycardic agents, poor pacemaker candidate, Cards input x 1, ? Short term Isoproterenol - BP stable.       Condition - Extremely Guarded  Family Communication  : Wife Ryan Copeland [phone] on 02/02/2021, 02/03/2021 x 2, 02/04/21, called wife twice on 02/05/2021/left at 10:45 AM, called again at 1 AM no response, 02/06/21, 02/06/2021, 02/08/2021  Code Status :  DNR  Consults  :  Oncology, radiation oncology, IR, pulmonary over the phone  PUD Prophylaxis :  Pepcid   Procedures  :     L 3 Biopsy 02/04/21  MRI Brain - non acute  CT Chest - 1. Clear progression of the left upper lobe pulmonary nodule, now measuring 1.6 cm in the same dimension that was previously measured at 1.2 cm. 2. Interval progression of patchy and nodular airspace disease in the left lower lobe with associated volume loss. Imaging features likely related to infectious/inflammatory etiology. 3. Aortic Atherosclerosis  MRI L Spine -  1. Approximately 2 cm round lesion within the anterior/superior L3 vertebral body, concerning for metastatic disease in this patient with known lung cancer. Surrounding marrow edema may be reactive or represent superimposed pathologic fracture. 2. Approximately 10 mm L2 and 8 mm L1 lesions are indeterminate. The patient refused contrast on this study. If the patient is willing, postcontrast imaging may be helpful to better evaluate. Alternatively, follow-up MRI could assess stability. 3. No significant canal or foraminal stenosis.      Disposition Plan  :    Status is: Inpt  DVT Prophylaxis  :      Lab Results  Component Value Date   PLT 56 (L) 02/06/2021    Diet :  Diet Order             Diet regular Room service appropriate? Yes; Fluid consistency: Thin  Diet effective now                    Inpatient Medications  Scheduled Meds:  acyclovir  400 mg Oral BID   allopurinol  300 mg Oral Daily   celecoxib  200 mg Oral BID   guaiFENesin  5 mL Oral TID    heparin lock flush  500 Units Intracatheter Q30 days   HYDROcodone-acetaminophen  15 mL Oral Q6H   LORazepam  1 mg Intravenous Q6H   Continuous Infusions:   PRN Meds:.acetaminophen **OR** acetaminophen, antiseptic oral rinse, diphenhydrAMINE, diphenoxylate-atropine, glycopyrrolate **OR** glycopyrrolate **OR** glycopyrrolate, haloperidol lactate **OR** haloperidol lactate, heparin lock flush **AND** heparin lock flush, HYDROmorphone (DILAUDID) injection, LORazepam **OR** LORazepam **OR** LORazepam, ondansetron **OR** ondansetron (ZOFRAN) IV, polyvinyl alcohol, sodium chloride flush, zolpidem  Antibiotics  :    Anti-infectives (From admission, onward)    Start     Dose/Rate Route Frequency Ordered Stop   02/02/21 1000  vancomycin (VANCOCIN) IVPB 1000 mg/200 mL premix  Status:  Discontinued        1,000 mg 200 mL/hr over 60 Minutes Intravenous Every 12 hours 02/02/21 0008 02/04/21 0720   02/02/21 0200  ceFEPIme (MAXIPIME) 2 g in sodium chloride 0.9 % 100 mL IVPB  Status:  Discontinued        2 g 200 mL/hr over 30 Minutes Intravenous Every 8 hours 02/02/21 0008 02/05/21 1626   02/01/21 2200  acyclovir (ZOVIRAX) tablet 400 mg        400 mg Oral 2 times daily 02/01/21 2108     02/01/21 1745  vancomycin (VANCOREADY) IVPB 1750 mg/350 mL        1,750 mg 175 mL/hr over 120 Minutes Intravenous  Once 02/01/21 1733 02/01/21 2255   02/01/21 1745  piperacillin-tazobactam (ZOSYN) IVPB 3.375 g        3.375 g 12.5 mL/hr over 240 Minutes Intravenous  Once 02/01/21 1733 02/01/21 2255        Time Spent in minutes  30   Lala Lund M.D on 02/08/2021 at 10:38 AM  To page go to [email or web address]   [facility] -  Office  [phone]  See all Orders from today for further details    Objective:   Vitals:   02/06/21 1724 02/06/21 2024 02/07/21 2101 02/08/21 0928  BP:  (!) 141/59 (!) 111/57 (!) 142/54  Pulse: (!) 27 (!) 32 (!) 41 (!) 46  Resp:  18 18 19   Temp:   (!) 97.5 F (36.4 C)  (!) 97.5 F (36.4 C)  TempSrc:   Oral Oral  SpO2: 100% 97% 98% 100%    Wt Readings from Last 3 Encounters:  01/29/21 78.4 kg  01/13/21 76.8 kg  01/04/21 76.5 kg     Intake/Output Summary (Last 24 hours) at 02/08/2021 1038 Last data filed at 02/08/2021 0900 Gross per 24 hour  Intake 150 ml  Output 510 ml  Net -360 ml     Physical Exam  Awake Alert x2, No new F.N deficits,   [place].AT,PERRAL Supple Neck, No JVD,   Symmetrical Chest wall movement, Good air movement bilaterally, CTAB RRR,No Gallops, Rubs or new Murmurs,  +ve B.Sounds, Abd Soft, No tenderness,   Suprapubic catheter      Data Review:    CBC Recent Labs  Lab 02/01/21 1617 02/02/21 0427 02/03/21 0439 02/04/21 0252 02/05/21 0507 02/06/21 1003  WBC 9.7 10.1 3.9* 2.8* 2.6* 3.7*  HGB 12.0* 11.6* 10.9* 11.2* 11.5* 12.6*  HCT 38.4* 36.3* 34.3* 34.8* 36.4* 38.5*  PLT 66* 54* 55* 48* 49* 56*  MCV 89.7 89.9 89.3 87.4 90.1 87.3  MCH 28.0 28.7 28.4 28.1 28.5 28.6  MCHC 31.3 32.0 31.8 32.2 31.6 32.7  RDW 17.1*  17.2* 17.0* 16.6* 16.7* 16.8*  LYMPHSABS 6.7*  --  1.8 0.7 0.5* 0.3*  MONOABS 0.6  --  0.4 0.4 0.4 0.8  EOSABS 0.0  --  0.0 0.1 0.1 0.1  BASOSABS 0.0  --  0.0 0.0 0.0 0.0    Electrolytes Recent Labs  Lab 02/01/21 1617 02/01/21 1618 02/02/21 0427 02/02/21 2137 02/03/21 0439 02/04/21 0252 02/05/21 0507 02/06/21 1003  NA 134*  --  136  --  139 138 146* 143  K 4.1  --  5.7*  --  3.7 3.3* 3.9 3.3*  CL 100  --  105  --  107 108 112* 112*  CO2 26  --  25  --  27 25 24  21*  GLUCOSE 115*  --  80  --  113* 93 56* 64*  BUN 14  --  14  --  16 13 14 14   CREATININE 0.98  --  0.87  --  0.82 0.85 1.01 0.95  CALCIUM 8.9  --  8.5*  --  8.8* 8.5* 9.0 9.0  AST 17  --   --   --  13* 15 20 18   ALT 25  --   --   --  17 16 18 16   ALKPHOS 86  --   --   --  71 66 77 70  BILITOT 0.3  --   --   --  0.6 0.8 1.1 1.0  ALBUMIN 3.6  --   --   --  2.7* 2.8* 3.2* 3.2*  MG  --   --   --  2.0 1.7 1.8 1.8 1.6*  CRP  --    --   --  3.7* 2.5* 0.8 0.7 0.8  PROCALCITON  --   --   --  <0.10 43.03 <0.10 <0.10 0.12  LATICACIDVEN  --  1.2  --   --   --   --   --   --   TSH  --   --   --   --   --   --   --  5.770*  AMMONIA 14  --   --   --   --   --   --   --   BNP  --   --   --  96.8 114.3* 452.7* 405.2* 276.7*    ------------------------------------------------------------------------------------------------------------------ No results for input(s): CHOL, HDL, LDLCALC, TRIG, CHOLHDL, LDLDIRECT in the last 72 hours.  Lab Results  Component Value Date   HGBA1C 4.6 (L) 11/02/2020    Recent Labs    02/06/21 1003  TSH 5.770*   ------------------------------------------------------------------------------------------------------------------ ID Labs Recent Labs  Lab 02/01/21 1618 02/02/21 0427 02/02/21 2137 02/03/21 0439 02/04/21 0252 02/05/21 0507 02/06/21 1003  WBC  --  10.1  --  3.9* 2.8* 2.6* 3.7*  PLT  --  54*  --  55* 48* 49* 56*  CRP  --   --  3.7* 2.5* 0.8 0.7 0.8  PROCALCITON  --   --  <0.10 43.03 <0.10 <0.10 0.12  LATICACIDVEN 1.2  --   --   --   --   --   --   CREATININE  --  0.87  --  0.82 0.85 1.01 0.95   Cardiac Enzymes No results for input(s): CKMB, TROPONINI, MYOGLOBIN in the last 168 hours.  Invalid input(s): CK  Radiology Reports CT HEAD WO CONTRAST (5MM)  Result Date: 02/06/2021 CLINICAL DATA:  Delirium EXAM: CT HEAD WITHOUT CONTRAST TECHNIQUE: Contiguous axial images were obtained from  the base of the skull through the vertex without intravenous contrast. COMPARISON:  MR brain, 02/03/2021 FINDINGS: Brain: No evidence of acute infarction, hemorrhage, hydrocephalus, extra-axial collection or mass lesion/mass effect. Mild periventricular white matter hypodensity. Vascular: No hyperdense vessel or unexpected calcification. Skull: Normal. Negative for fracture or focal lesion. Sinuses/Orbits: No acute finding. Other: None. IMPRESSION: No acute intracranial pathology. Mild  small-vessel white matter disease. Electronically Signed   By: Delanna Ahmadi M.D.   On: 02/06/2021 16:47   CT CHEST W CONTRAST  Result Date: 02/04/2021 CLINICAL DATA:  Inpatient. Non-small cell lung cancers status post SBRT in December. Restaging. EXAM: CT CHEST WITH CONTRAST TECHNIQUE: Multidetector CT imaging of the chest was performed during intravenous contrast administration. CONTRAST:  71mL OMNIPAQUE IOHEXOL 350 MG/ML SOLN COMPARISON:  02/02/2021 and 12/02/2020 chest CT. FINDINGS: Cardiovascular: Top-normal heart size. No significant pericardial effusion/thickening. Three-vessel coronary atherosclerosis. Right internal jugular Port-A-Cath terminates at the cavoatrial junction. Atherosclerotic nonaneurysmal thoracic aorta. Top-normal caliber main pulmonary artery (3.2 cm diameter). No central pulmonary emboli. Mediastinum/Nodes: No discrete thyroid nodules. Unremarkable esophagus. No axillary adenopathy. Enlarged 1.3 cm subcarinal node (series 3/image 84), newly enlarged since 12/02/2020, unchanged from 02/02/2021. Enlarged 1.5 cm left hilar node (series 3/image 84), unchanged from 02/02/2021, unchanged from 12/02/2020. No right hilar adenopathy. Lungs/Pleura: No pneumothorax. Trace to left pleural effusion. No right pleural effusion. Mild centrilobular and paraseptal emphysema with mild diffuse bronchial wall thickening. Spiculated solid 1.9 x 1.7 cm anterior left upper lobe pulmonary nodule (series 7/image 88), increased from 1.4 x 1.2 cm on 12/02/2020 CT, unchanged from 02/02/2021 CT. Irregular solid medial central left upper lobe 2.4 x 1.7 cm nodule (series 7/image 81), mildly increased from 2.2 x 1.8 cm on 12/02/2020, unchanged from 02/02/2021. Solid 0.8 cm superior segment left lower lobe pulmonary nodule (series 7/image 76), increased from 0.4 cm on 12/02/2020, unchanged from 02/02/2021. Anterior left lower lobe 1.0 cm solid pulmonary nodule (series 7/image 125), increased from 0.4 cm on 12/02/2020,  unchanged from 02/02/2021. Patchy consolidation in the anterior and dependent basilar left lower lobe is minimally improved from 02/02/2021 and largely new/in a different configuration from 12/02/2020. Several additional smaller solid pulmonary nodules scattered in both lungs are not substantially changed from 12/02/2020 or 02/02/2021, for example measuring 1.0 cm in the medial right middle lobe (series 7/image 106) and 0.6 cm in the posterior left upper lobe (series 7/image 81). Upper abdomen: Cholecystectomy. Partially visualized retroperitoneal lymphadenopathy involving the left para-aortic and aortocaval chains, not substantially changed since 11/02/2020 CT abdomen/pelvis study, for example measuring 2.0 cm in the left periaortic chain (series 3/image 189) and 1.6 cm in the aortocaval chain (series 3/image 187). Partially visualized splenomegaly, unchanged. Musculoskeletal: No aggressive appearing focal osseous lesions. Mild thoracic spondylosis. IMPRESSION: 1. Spiculated solid 1.9 cm anterior left upper lobe pulmonary nodule, increased from 1.4 cm on 12/02/2020 CT, unchanged from 02/02/2021 CT, suspicious for primary bronchogenic carcinoma. 2. Medial central left upper lobe irregular solid 2.4 cm nodule, increased from 2.2 cm on 12/02/2020 CT, unchanged from 02/02/2021 CT. Left lower lobe 1.0 cm and 0.8 cm solid pulmonary nodules have both increased since 12/02/2020 CT, unchanged from 02/02/2021 CT. These findings are suspicious for metastatic disease and/or synchronous primary bronchogenic carcinomas. 3. Mild subcarinal lymphadenopathy is new since 12/02/2020. Left hilar adenopathy is stable. Partially visualized retroperitoneal lymphadenopathy in the upper abdomen, not substantially changed since 11/02/2020 CT abdomen/pelvis study. Partially visualized splenomegaly. These findings are uncertain for mildly progressive lymphoproliferative condition versus metastatic disease. 4. Trace left pleural  effusion. 5.  Patchy consolidation in the anterior and dependent basilar left lower lobe is minimally improved from 02/02/2021 and largely new/in a different configuration from 12/02/2020 CT, favoring improving recurrent pneumonia. 6. Three-vessel coronary atherosclerosis. 7. Aortic Atherosclerosis (ICD10-I70.0) and Emphysema (ICD10-J43.9). Electronically Signed   By: Ilona Sorrel M.D.   On: 02/04/2021 12:27   CT BIOPSY  Result Date: 02/04/2021 Criselda Peaches, MD     02/04/2021 12:37 PM Interventional Radiology Procedure Note Procedure: CT biopsy of L3 lesion Complications: None Estimated Blood Loss: None Recommendations: - Bedrest x 1 hr - Path sent Signed, Criselda Peaches, MD

## 2021-02-08 NOTE — Discharge Instructions (Signed)
Disposition.  Residential hospice Condition.  Guarded CODE STATUS.  DNR Activity.  With assistance as tolerated, full fall precautions. Diet.  Soft with feeding assistance and aspiration precautions. Goal of care.  Comfort.

## 2021-02-09 ENCOUNTER — Ambulatory Visit: Payer: Medicare Other | Admitting: Radiation Oncology

## 2021-02-09 LAB — SURGICAL PATHOLOGY

## 2021-02-10 ENCOUNTER — Ambulatory Visit: Payer: Medicare Other | Admitting: Radiation Oncology

## 2021-02-10 ENCOUNTER — Ambulatory Visit: Payer: Medicare Other

## 2021-02-11 ENCOUNTER — Ambulatory Visit: Payer: Medicare Other

## 2021-02-12 ENCOUNTER — Ambulatory Visit: Payer: Medicare Other

## 2021-02-14 DEATH — deceased

## 2021-02-15 ENCOUNTER — Other Ambulatory Visit (HOSPITAL_COMMUNITY): Payer: Self-pay

## 2021-02-16 ENCOUNTER — Ambulatory Visit: Payer: Medicare Other

## 2021-02-17 ENCOUNTER — Telehealth: Payer: Self-pay | Admitting: Pulmonary Disease

## 2021-02-17 ENCOUNTER — Other Ambulatory Visit (HOSPITAL_COMMUNITY): Payer: Self-pay

## 2021-02-17 ENCOUNTER — Ambulatory Visit: Payer: Medicare Other

## 2021-02-17 NOTE — Telephone Encounter (Signed)
She was appreciative of the call  and she wanted to let you know that both her and the patient thought the world of you and all your hard work. I let her know I would pass the message along.

## 2021-02-18 ENCOUNTER — Ambulatory Visit: Payer: Medicare Other

## 2021-02-19 ENCOUNTER — Other Ambulatory Visit: Payer: Self-pay | Admitting: Hematology and Oncology

## 2021-02-19 ENCOUNTER — Ambulatory Visit: Payer: Medicare Other

## 2021-02-22 ENCOUNTER — Ambulatory Visit: Payer: Medicare Other

## 2021-02-23 ENCOUNTER — Ambulatory Visit: Payer: Medicare Other

## 2021-02-23 ENCOUNTER — Ambulatory Visit: Payer: Medicare Other | Admitting: Hematology and Oncology

## 2021-02-24 ENCOUNTER — Ambulatory Visit: Payer: Medicare Other

## 2021-02-24 NOTE — Progress Notes (Addendum)
°  Radiation Oncology         [phone] ________________________________  Name: Ryan Copeland MRN: [id number]  Date: 02/06/2021  DOB: 1950/12/20  End of Treatment Note  Diagnosis:   Progressive metastatic Stage IA2, cT1bN0M0, NSCLC, of the LUL     Indication for treatment::  palliative       Radiation treatment dates:   No treatment was delivered.  Site/planned dose:   The patient had an L3 leson that was biopsied and consistent with metastatic lung cancer. He proceeded with simulation but his clinical status declined and decisions were made to forgo radiotherapy and proceed with hospice.  Plan: We will follow along with the patient's course.      Carola Rhine, PAC

## 2021-02-26 ENCOUNTER — Ambulatory Visit: Payer: Medicare Other

## 2021-02-26 ENCOUNTER — Other Ambulatory Visit: Payer: Medicare Other

## 2021-02-26 ENCOUNTER — Ambulatory Visit: Payer: Medicare Other | Admitting: Hematology and Oncology

## 2021-03-10 ENCOUNTER — Other Ambulatory Visit: Payer: Medicare Other

## 2021-03-17 ENCOUNTER — Ambulatory Visit: Payer: Medicare Other | Admitting: Cardiology

## 2021-03-17 NOTE — Telephone Encounter (Signed)
Card in [place]

## 2021-03-17 DEATH — deceased

## 2021-03-26 ENCOUNTER — Ambulatory Visit: Payer: Medicare Other

## 2021-03-26 ENCOUNTER — Other Ambulatory Visit: Payer: Medicare Other

## 2021-03-26 ENCOUNTER — Ambulatory Visit: Payer: Medicare Other | Admitting: Hematology and Oncology

## 2022-03-21 IMAGING — CT CT HEAD W/O CM
4 series · 17 of 47 positions shown, 19 images · non-contrast
Comparison: MR brain, 02/03/2021

CLINICAL DATA: Delirium

EXAM:
CT HEAD WITHOUT CONTRAST
TECHNIQUE: Contiguous axial images were obtained from the base of the skull
through the vertex without intravenous contrast.

[Series 3: head bone · axial · 0.46mm/px · z∈[+1277,+1335]mm · 4 of 85 slices shown]
[im 9/85  bone]
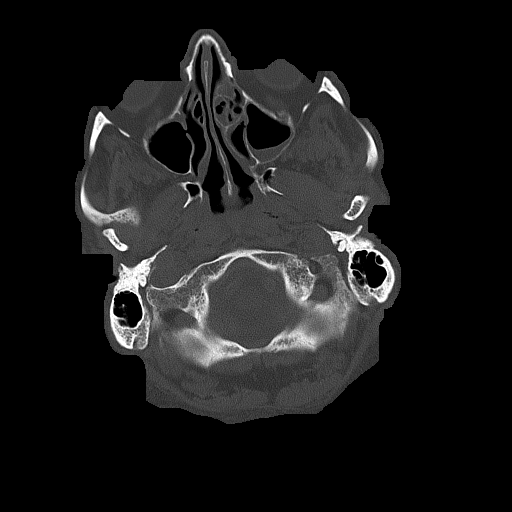
[im 17/85  bone]
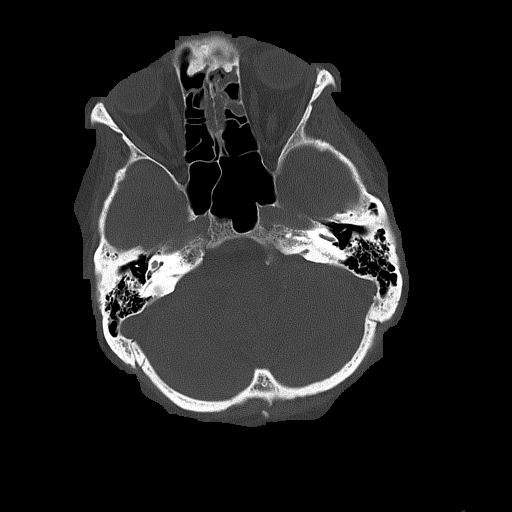
[im 26/85  bone]
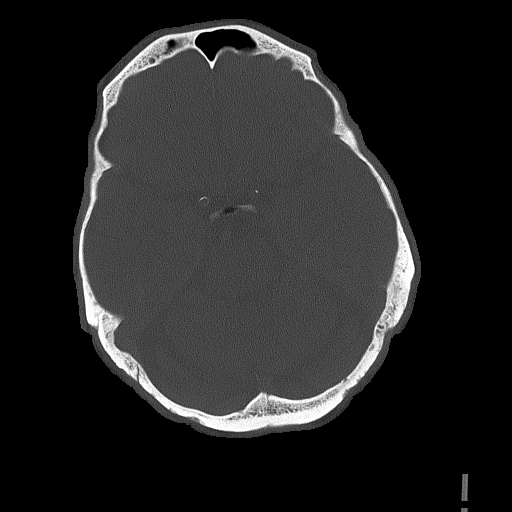
[im 38/85  bone]
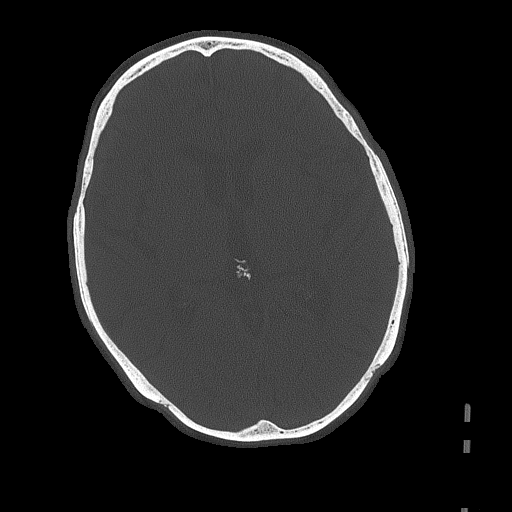

[Series 4: head without · axial · non-contrast · 0.45mm/px · z∈[+1282,+1407]mm · 7 of 35 slices shown, 9 images]
[im 5/35  brain]
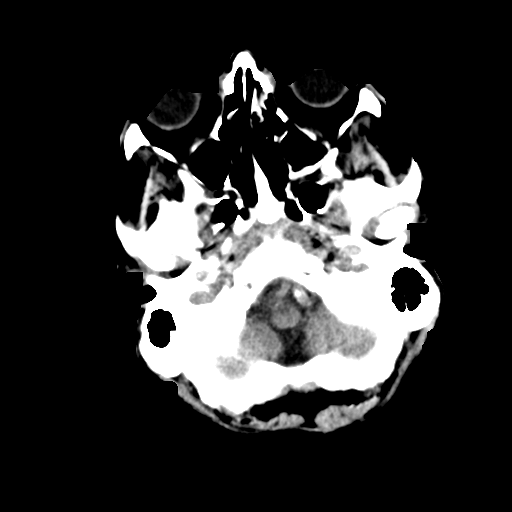
[im 5/35  bone]
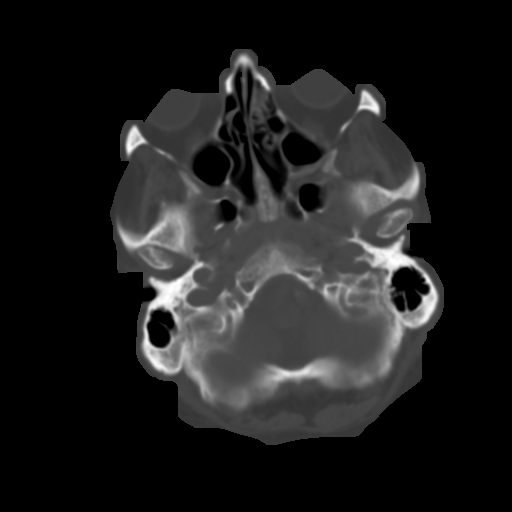
[im 9/35  brain]
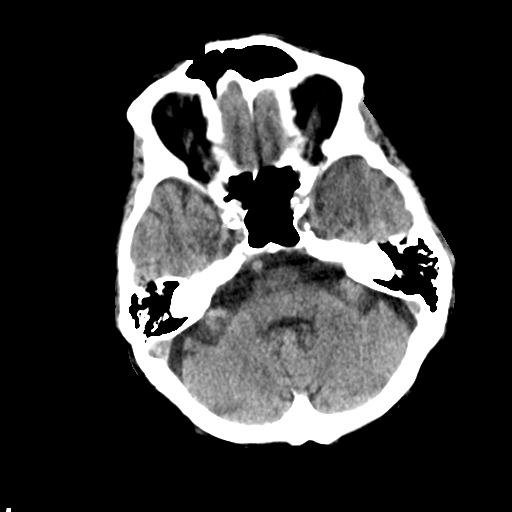
[im 13/35  brain]
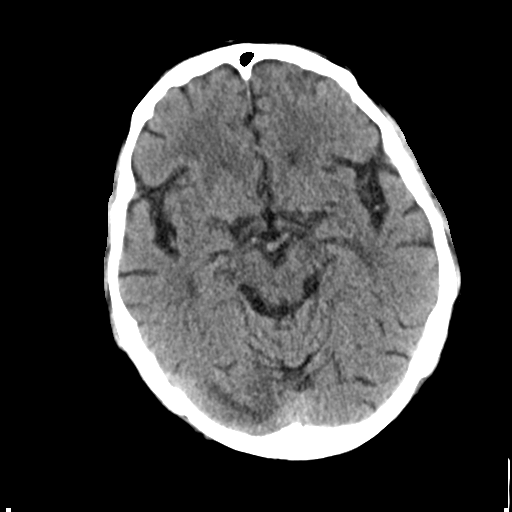
[im 18/35  brain]
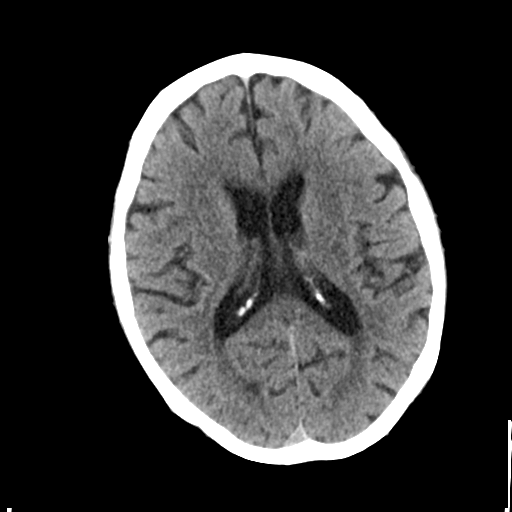
[im 22/35  brain]
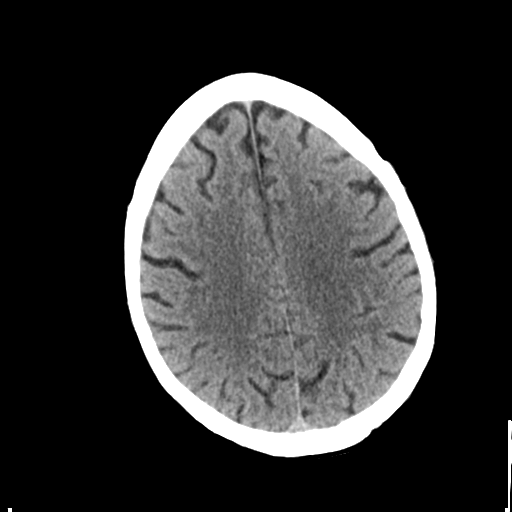
[im 22/35  bone]
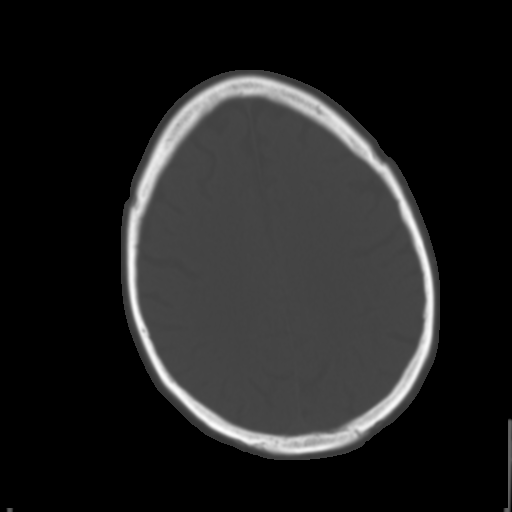
[im 26/35  brain]
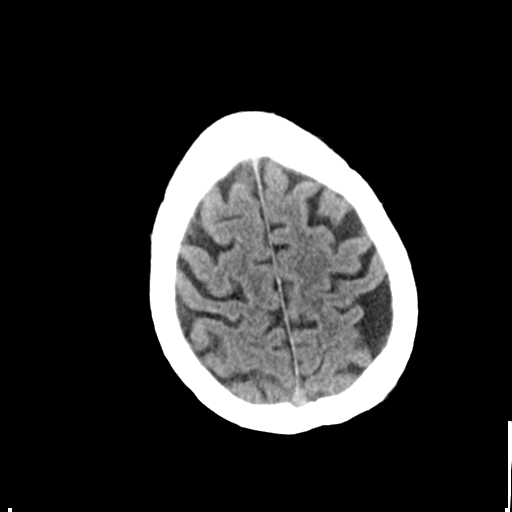
[im 30/35  brain]
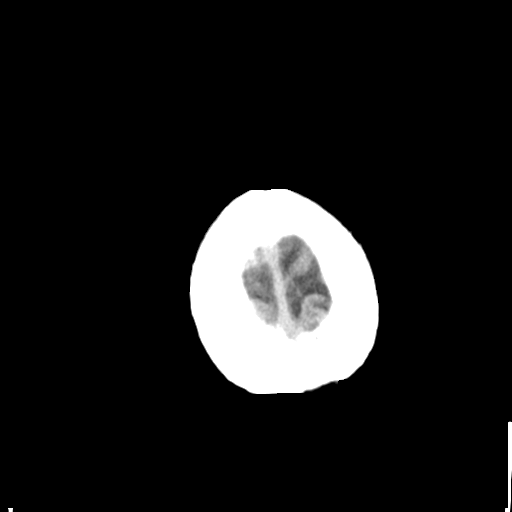

[Series 5: head without cor · coronal · non-contrast · 0.34mm/px · 3 of 67 slices shown]
[im 23/67  brain]
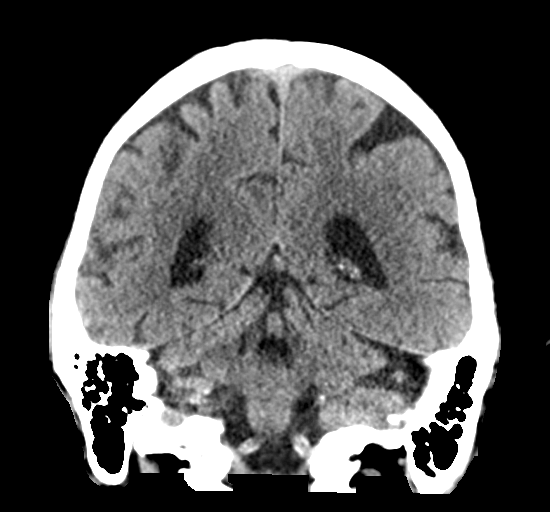
[im 30/67  brain]
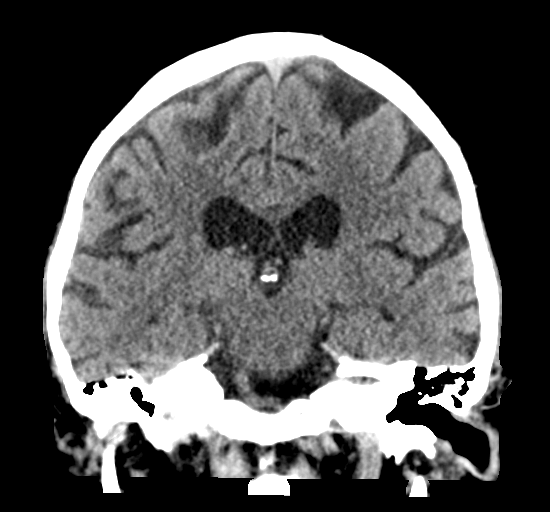
[im 37/67  brain]
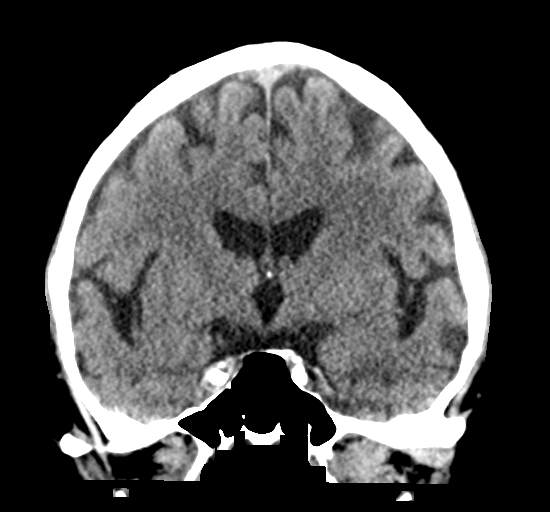

[Series 6: head without sag · sagittal · non-contrast · 0.34mm/px · 3 of 58 slices shown]
[im 20/58  brain]
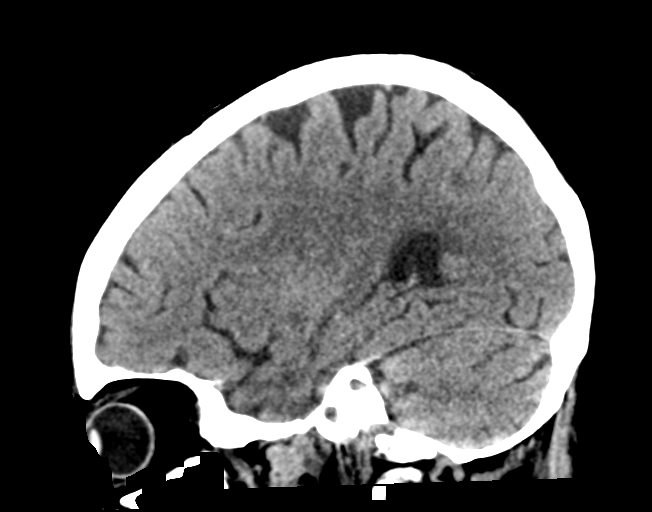
[im 29/58  brain]
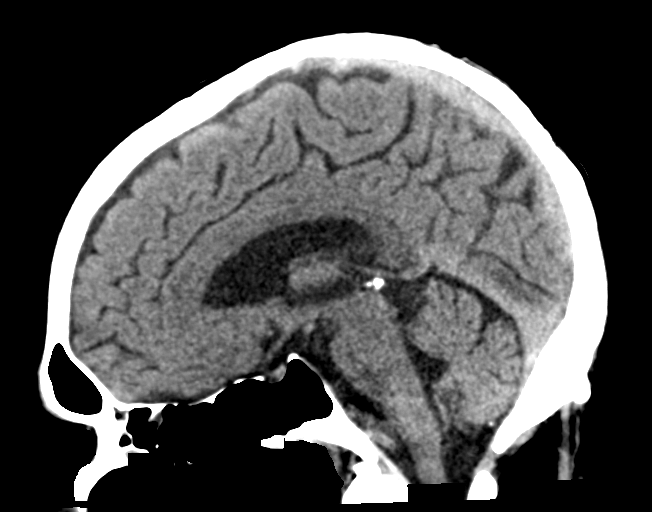
[im 39/58  brain]
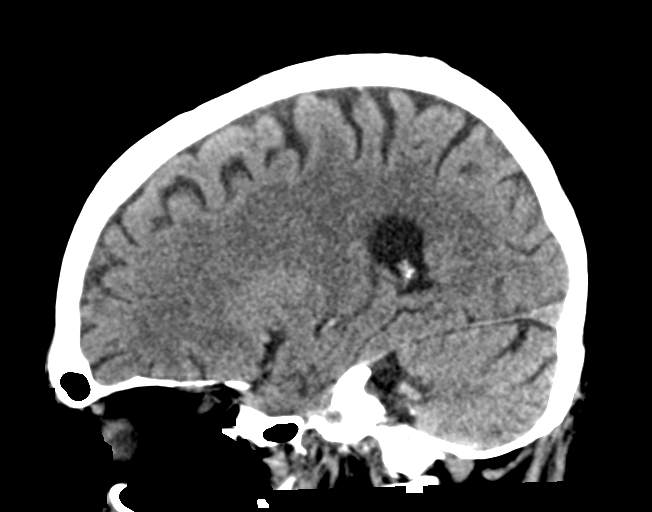

[17 of 47 positions shown; findings below may reference images not displayed]

FINDINGS: Brain: No evidence of acute infarction, hemorrhage, hydrocephalus,
extra-axial collection or mass lesion/mass effect. Mild
periventricular white matter hypodensity.

Vascular: No hyperdense vessel or unexpected calcification.

Skull: Normal. Negative for fracture or focal lesion.

Sinuses/Orbits: No acute finding.

Other: None.
IMPRESSION: No acute intracranial pathology. Mild small-vessel white matter
disease.
